# Patient Record
Sex: Female | Born: 1950
Health system: Southern US, Community
[De-identification: ages and names within clinical notes are randomized; demographics above are authoritative.]

## PROBLEM LIST (undated history)

## (undated) DIAGNOSIS — R9431 Abnormal electrocardiogram [ECG] [EKG]: Secondary | ICD-10-CM

## (undated) DIAGNOSIS — F32A Depression, unspecified: Secondary | ICD-10-CM

## (undated) DIAGNOSIS — I4891 Unspecified atrial fibrillation: Secondary | ICD-10-CM

## (undated) DIAGNOSIS — Z950 Presence of cardiac pacemaker: Secondary | ICD-10-CM

## (undated) DIAGNOSIS — I5032 Chronic diastolic (congestive) heart failure: Secondary | ICD-10-CM

## (undated) DIAGNOSIS — M545 Low back pain: Secondary | ICD-10-CM

## (undated) DIAGNOSIS — I609 Nontraumatic subarachnoid hemorrhage, unspecified: Secondary | ICD-10-CM

## (undated) DIAGNOSIS — C4490 Unspecified malignant neoplasm of skin, unspecified: Secondary | ICD-10-CM

## (undated) DIAGNOSIS — E782 Mixed hyperlipidemia: Secondary | ICD-10-CM

## (undated) DIAGNOSIS — I7121 Aneurysm of the ascending aorta, without rupture: Secondary | ICD-10-CM

## (undated) DIAGNOSIS — N189 Chronic kidney disease, unspecified: Secondary | ICD-10-CM

## (undated) DIAGNOSIS — F319 Bipolar disorder, unspecified: Secondary | ICD-10-CM

## (undated) DIAGNOSIS — M109 Gout, unspecified: Secondary | ICD-10-CM

## (undated) DIAGNOSIS — I10 Essential (primary) hypertension: Secondary | ICD-10-CM

## (undated) DIAGNOSIS — S065XAA Traumatic subdural hemorrhage with loss of consciousness status unknown, initial encounter: Secondary | ICD-10-CM

## (undated) DIAGNOSIS — E669 Obesity, unspecified: Secondary | ICD-10-CM

## (undated) DIAGNOSIS — R296 Repeated falls: Secondary | ICD-10-CM

## (undated) DIAGNOSIS — I4892 Unspecified atrial flutter: Secondary | ICD-10-CM

## (undated) DIAGNOSIS — E785 Hyperlipidemia, unspecified: Secondary | ICD-10-CM

## (undated) DIAGNOSIS — K5792 Diverticulitis of intestine, part unspecified, without perforation or abscess without bleeding: Secondary | ICD-10-CM

## (undated) DIAGNOSIS — Z954 Presence of other heart-valve replacement: Secondary | ICD-10-CM

## (undated) DIAGNOSIS — E119 Type 2 diabetes mellitus without complications: Secondary | ICD-10-CM

## (undated) DIAGNOSIS — R001 Bradycardia, unspecified: Secondary | ICD-10-CM

## (undated) DIAGNOSIS — D649 Anemia, unspecified: Secondary | ICD-10-CM

## (undated) DIAGNOSIS — D259 Leiomyoma of uterus, unspecified: Secondary | ICD-10-CM

## (undated) DIAGNOSIS — E0500 Thyrotoxicosis with diffuse goiter without thyrotoxic crisis or storm: Secondary | ICD-10-CM

## (undated) DIAGNOSIS — W19XXXA Unspecified fall, initial encounter: Secondary | ICD-10-CM

## (undated) DIAGNOSIS — G43909 Migraine, unspecified, not intractable, without status migrainosus: Secondary | ICD-10-CM

## (undated) DIAGNOSIS — E1169 Type 2 diabetes mellitus with other specified complication: Secondary | ICD-10-CM

## (undated) DIAGNOSIS — F329 Major depressive disorder, single episode, unspecified: Secondary | ICD-10-CM

## (undated) DIAGNOSIS — N390 Urinary tract infection, site not specified: Secondary | ICD-10-CM

## (undated) HISTORY — DX: Presence of other heart-valve replacement: Z95.4

## (undated) HISTORY — DX: Obesity, unspecified: E66.9

## (undated) HISTORY — DX: Depression, unspecified: F32.A

## (undated) HISTORY — PX: CHOLECYSTECTOMY: SHX55

## (undated) HISTORY — DX: Urinary tract infection, site not specified: N39.0

## (undated) HISTORY — DX: Mixed hyperlipidemia: E78.2

## (undated) HISTORY — DX: Unspecified malignant neoplasm of skin, unspecified: C44.90

## (undated) HISTORY — DX: Major depressive disorder, single episode, unspecified: F32.9

## (undated) HISTORY — DX: Thyrotoxicosis with diffuse goiter without thyrotoxic crisis or storm: E05.00

## (undated) HISTORY — PX: OTHER SURGICAL HISTORY: SHX169

## (undated) HISTORY — DX: Leiomyoma of uterus, unspecified: D25.9

## (undated) HISTORY — DX: Anemia, unspecified: D64.9

## (undated) HISTORY — DX: Migraine, unspecified, not intractable, without status migrainosus: G43.909

## (undated) HISTORY — DX: Diverticulitis of intestine, part unspecified, without perforation or abscess without bleeding: K57.92

## (undated) HISTORY — PX: TUBAL LIGATION: SHX77

## (undated) HISTORY — DX: Low back pain: M54.5

## (undated) HISTORY — DX: Hyperlipidemia, unspecified: E78.5

## (undated) HISTORY — PX: APPENDECTOMY: SHX54

## (undated) HISTORY — DX: Bipolar disorder, unspecified: F31.9

## (undated) HISTORY — DX: Type 2 diabetes mellitus with other specified complication: E11.69

## (undated) HISTORY — PX: ABDOMINAL HYSTERECTOMY: SHX81

## (undated) HISTORY — DX: Gout, unspecified: M10.9

---

## 2004-02-06 ENCOUNTER — Encounter: Payer: Self-pay | Admitting: Gastroenterology

## 2004-05-26 LAB — HM MAMMOGRAPHY

## 2006-05-26 LAB — HM COLONOSCOPY

## 2007-04-15 ENCOUNTER — Encounter: Payer: Self-pay | Admitting: Gastroenterology

## 2008-01-17 ENCOUNTER — Ambulatory Visit: Payer: Self-pay | Admitting: Vascular Surgery

## 2008-02-08 ENCOUNTER — Ambulatory Visit: Payer: Self-pay | Admitting: Vascular Surgery

## 2008-02-21 ENCOUNTER — Ambulatory Visit: Payer: Self-pay | Admitting: Vascular Surgery

## 2008-03-06 ENCOUNTER — Ambulatory Visit: Payer: Self-pay | Admitting: Vascular Surgery

## 2008-03-20 ENCOUNTER — Ambulatory Visit: Payer: Self-pay | Admitting: Vascular Surgery

## 2008-04-13 ENCOUNTER — Encounter: Admission: RE | Admit: 2008-04-13 | Discharge: 2008-04-13 | Payer: Self-pay | Admitting: Interventional Cardiology

## 2008-04-24 ENCOUNTER — Ambulatory Visit: Payer: Self-pay | Admitting: Vascular Surgery

## 2008-04-24 ENCOUNTER — Encounter (INDEPENDENT_AMBULATORY_CARE_PROVIDER_SITE_OTHER): Payer: Self-pay | Admitting: Interventional Cardiology

## 2008-04-24 ENCOUNTER — Inpatient Hospital Stay (HOSPITAL_BASED_OUTPATIENT_CLINIC_OR_DEPARTMENT_OTHER): Admission: RE | Admit: 2008-04-24 | Discharge: 2008-04-24 | Payer: Self-pay | Admitting: Interventional Cardiology

## 2008-04-27 ENCOUNTER — Ambulatory Visit (HOSPITAL_COMMUNITY): Admission: RE | Admit: 2008-04-27 | Discharge: 2008-04-27 | Payer: Self-pay | Admitting: Interventional Cardiology

## 2008-05-02 ENCOUNTER — Ambulatory Visit: Payer: Self-pay | Admitting: Surgery

## 2008-05-04 ENCOUNTER — Ambulatory Visit (HOSPITAL_COMMUNITY): Admission: RE | Admit: 2008-05-04 | Discharge: 2008-05-04 | Payer: Self-pay | Admitting: Surgery

## 2008-05-09 ENCOUNTER — Ambulatory Visit: Payer: Self-pay | Admitting: Surgery

## 2008-05-22 ENCOUNTER — Inpatient Hospital Stay (HOSPITAL_COMMUNITY): Admission: RE | Admit: 2008-05-22 | Discharge: 2008-05-28 | Payer: Self-pay | Admitting: Surgery

## 2008-05-22 ENCOUNTER — Ambulatory Visit: Payer: Self-pay | Admitting: Surgery

## 2008-05-22 ENCOUNTER — Encounter: Payer: Self-pay | Admitting: Surgery

## 2008-06-15 ENCOUNTER — Encounter (HOSPITAL_COMMUNITY): Admission: RE | Admit: 2008-06-15 | Discharge: 2008-09-13 | Payer: Self-pay | Admitting: Interventional Cardiology

## 2008-06-26 ENCOUNTER — Encounter: Admission: RE | Admit: 2008-06-26 | Discharge: 2008-06-26 | Payer: Self-pay | Admitting: Surgery

## 2008-06-26 ENCOUNTER — Ambulatory Visit: Payer: Self-pay | Admitting: Surgery

## 2008-07-19 ENCOUNTER — Encounter: Admission: RE | Admit: 2008-07-19 | Discharge: 2008-07-19 | Payer: Self-pay | Admitting: Interventional Cardiology

## 2008-07-21 ENCOUNTER — Ambulatory Visit (HOSPITAL_COMMUNITY): Admission: RE | Admit: 2008-07-21 | Discharge: 2008-07-21 | Payer: Self-pay | Admitting: Interventional Cardiology

## 2008-09-14 ENCOUNTER — Encounter (HOSPITAL_COMMUNITY): Admission: RE | Admit: 2008-09-14 | Discharge: 2008-12-13 | Payer: Self-pay | Admitting: Interventional Cardiology

## 2009-05-26 HISTORY — PX: AORTIC VALVE REPLACEMENT: SHX41

## 2010-01-24 ENCOUNTER — Inpatient Hospital Stay (HOSPITAL_COMMUNITY): Admission: EM | Admit: 2010-01-24 | Discharge: 2010-01-31 | Payer: Self-pay | Admitting: General Surgery

## 2010-01-24 ENCOUNTER — Ambulatory Visit: Payer: Self-pay | Admitting: Diagnostic Radiology

## 2010-01-24 ENCOUNTER — Encounter: Payer: Self-pay | Admitting: Emergency Medicine

## 2010-01-25 ENCOUNTER — Encounter (INDEPENDENT_AMBULATORY_CARE_PROVIDER_SITE_OTHER): Payer: Self-pay

## 2010-02-05 ENCOUNTER — Inpatient Hospital Stay (HOSPITAL_COMMUNITY): Admission: EM | Admit: 2010-02-05 | Discharge: 2010-02-13 | Payer: Self-pay | Admitting: Emergency Medicine

## 2010-02-09 ENCOUNTER — Encounter (INDEPENDENT_AMBULATORY_CARE_PROVIDER_SITE_OTHER): Payer: Self-pay | Admitting: *Deleted

## 2010-03-11 ENCOUNTER — Ambulatory Visit: Payer: Self-pay | Admitting: Family Medicine

## 2010-03-11 DIAGNOSIS — D259 Leiomyoma of uterus, unspecified: Secondary | ICD-10-CM | POA: Insufficient documentation

## 2010-03-11 DIAGNOSIS — E782 Mixed hyperlipidemia: Secondary | ICD-10-CM | POA: Insufficient documentation

## 2010-03-11 DIAGNOSIS — E663 Overweight: Secondary | ICD-10-CM | POA: Insufficient documentation

## 2010-03-11 DIAGNOSIS — Z8679 Personal history of other diseases of the circulatory system: Secondary | ICD-10-CM | POA: Insufficient documentation

## 2010-03-11 DIAGNOSIS — K5900 Constipation, unspecified: Secondary | ICD-10-CM

## 2010-03-11 DIAGNOSIS — K5730 Diverticulosis of large intestine without perforation or abscess without bleeding: Secondary | ICD-10-CM | POA: Insufficient documentation

## 2010-03-11 DIAGNOSIS — Z9189 Other specified personal risk factors, not elsewhere classified: Secondary | ICD-10-CM | POA: Insufficient documentation

## 2010-03-11 DIAGNOSIS — Z952 Presence of prosthetic heart valve: Secondary | ICD-10-CM | POA: Insufficient documentation

## 2010-03-11 DIAGNOSIS — Z8619 Personal history of other infectious and parasitic diseases: Secondary | ICD-10-CM | POA: Insufficient documentation

## 2010-03-11 DIAGNOSIS — F319 Bipolar disorder, unspecified: Secondary | ICD-10-CM

## 2010-03-11 DIAGNOSIS — E781 Pure hyperglyceridemia: Secondary | ICD-10-CM | POA: Insufficient documentation

## 2010-03-11 DIAGNOSIS — Z954 Presence of other heart-valve replacement: Secondary | ICD-10-CM

## 2010-03-11 HISTORY — DX: Bipolar disorder, unspecified: F31.9

## 2010-03-11 HISTORY — DX: Presence of other heart-valve replacement: Z95.4

## 2010-03-11 HISTORY — DX: Constipation, unspecified: K59.00

## 2010-03-11 HISTORY — DX: Leiomyoma of uterus, unspecified: D25.9

## 2010-03-11 HISTORY — DX: Mixed hyperlipidemia: E78.2

## 2010-03-13 ENCOUNTER — Encounter: Payer: Self-pay | Admitting: Family Medicine

## 2010-03-13 LAB — CONVERTED CEMR LAB
Albumin: 4.6 g/dL (ref 3.5–5.2)
BUN: 27 mg/dL — ABNORMAL HIGH (ref 6–23)
CO2: 27 meq/L (ref 19–32)
Calcium: 9.5 mg/dL (ref 8.4–10.5)
Chloride: 105 meq/L (ref 96–112)
Creatinine, Ser: 1.09 mg/dL (ref 0.40–1.20)
Glucose, Bld: 86 mg/dL (ref 70–99)
HCT: 31 % — ABNORMAL LOW (ref 36.0–46.0)
Hemoglobin: 9.7 g/dL — ABNORMAL LOW (ref 12.0–15.0)
MCHC: 31.3 g/dL (ref 30.0–36.0)
MCV: 92.5 fL (ref 78.0–100.0)
Phosphorus: 3.7 mg/dL (ref 2.3–4.6)
Platelets: 250 10*3/uL (ref 150–400)
Potassium: 4.1 meq/L (ref 3.5–5.3)
RBC: 3.35 M/uL — ABNORMAL LOW (ref 3.87–5.11)
RDW: 15 % (ref 11.5–15.5)
Sodium: 143 meq/L (ref 135–145)
TSH: 2.171 microintl units/mL (ref 0.350–4.500)
WBC: 5 10*3/uL (ref 4.0–10.5)

## 2010-03-19 ENCOUNTER — Telehealth: Payer: Self-pay | Admitting: Family Medicine

## 2010-03-19 ENCOUNTER — Encounter: Payer: Self-pay | Admitting: Gastroenterology

## 2010-03-19 ENCOUNTER — Ambulatory Visit: Payer: Self-pay | Admitting: Family Medicine

## 2010-03-19 DIAGNOSIS — R1084 Generalized abdominal pain: Secondary | ICD-10-CM | POA: Insufficient documentation

## 2010-03-25 ENCOUNTER — Telehealth (INDEPENDENT_AMBULATORY_CARE_PROVIDER_SITE_OTHER): Payer: Self-pay | Admitting: *Deleted

## 2010-04-30 ENCOUNTER — Ambulatory Visit: Payer: Self-pay | Admitting: Gastroenterology

## 2010-05-07 ENCOUNTER — Ambulatory Visit: Payer: Self-pay | Admitting: Family Medicine

## 2010-05-28 DIAGNOSIS — D649 Anemia, unspecified: Secondary | ICD-10-CM | POA: Insufficient documentation

## 2010-05-28 DIAGNOSIS — M25569 Pain in unspecified knee: Secondary | ICD-10-CM | POA: Insufficient documentation

## 2010-05-28 HISTORY — DX: Anemia, unspecified: D64.9

## 2010-05-29 ENCOUNTER — Encounter (INDEPENDENT_AMBULATORY_CARE_PROVIDER_SITE_OTHER): Payer: Self-pay | Admitting: *Deleted

## 2010-05-29 ENCOUNTER — Encounter
Admission: RE | Admit: 2010-05-29 | Discharge: 2010-05-29 | Payer: Self-pay | Source: Home / Self Care | Attending: Family Medicine | Admitting: Family Medicine

## 2010-05-29 LAB — CONVERTED CEMR LAB
HCT: 33.1 %
Hemoglobin: 10.5 g/dL
MCV: 94.3 fL
Platelets: 279 10*3/uL
RBC: 3.51 M/uL
RDW: 13.6 %
WBC: 7.8 10*3/uL

## 2010-05-30 ENCOUNTER — Telehealth (INDEPENDENT_AMBULATORY_CARE_PROVIDER_SITE_OTHER): Payer: Self-pay | Admitting: *Deleted

## 2010-05-30 ENCOUNTER — Encounter (INDEPENDENT_AMBULATORY_CARE_PROVIDER_SITE_OTHER): Payer: Self-pay | Admitting: *Deleted

## 2010-06-11 ENCOUNTER — Ambulatory Visit
Admission: RE | Admit: 2010-06-11 | Discharge: 2010-06-11 | Payer: Self-pay | Source: Home / Self Care | Attending: Gastroenterology | Admitting: Gastroenterology

## 2010-06-25 NOTE — Progress Notes (Signed)
  Phone Note Outgoing Call   Call placed by: Rita Ohara Call placed to: Patient Summary of Call: Patient will be picking up hemacult cards this week because she droped off the wrong specimen.

## 2010-06-25 NOTE — Assessment & Plan Note (Signed)
Summary: BRAND NEW PT/TO EST/CJR   Vital Signs:  Patient profile:   60 year old female Menstrual status:  hysterectomy Height:      67.25 inches (170.82 cm) Weight:      179 pounds (81.36 kg) BMI:     27.93 O2 Sat:      99 % on Room air Temp:     99.0 degrees F (37.22 degrees C) oral Pulse rate:   82 / minute BP sitting:   132 / 78  (left arm) Cuff size:   regular  Vitals Entered By: Josph Macho RMA (March 11, 2010 11:32 AM)  O2 Flow:  Room air  Serial Vital Signs/Assessments:  Time      Position  BP       Pulse  Resp  Temp     By                     128/78                         Danise Edge MD  CC: Establish new pt/ CF Is Patient Diabetic? No     Menstrual Status hysterectomy   History of Present Illness: is a 60 year old nonsmoking Caucasian female in today with her mother for an initial new patient appointment. She has a complicated past medical history which includes long stay in [facility] this year for an Escherichia coli infection in her peritoneum. She went in for an appendicitis within several days at home and then had to go back secondary to her infection free and without having to reopen her and treat her with antibiotics and he'll quite content she says her abdominal pain is improved at this time but she is having a lot of trouble with constipation she reports taking 66 stool softeners over-the-counter b.i.d. She is unsure of the actual name of it. She also has a complicated cardiac history describes a murmur since birth with ultimately was followed his aortic stenosis resulting in an aortic valve replacement 2 years ago. She and her mother they report they have a cardiac aneurysm as well. Patient reports which was corrected when they replaced her aortic valve. Her cardiothoracic surgeon was Dr. Katrinka Blazing we will obtain those records. She has a long history of bipolar depression as well and has seen Dr. Andee Poles in the past continues to see her and her  partner for ongoing maintenance she does report depression slightly worsened but is not suicidal. She and her mother complex family history of depression alcoholism and anxiety her father was abusive, mentally ill and a drinker. She has 2 brothers both of whom are from depression and alcohol abuse and one of whom committed suicide at age 12. Today the patient's major complaint is constipation. She denies chest pain, palpitations, fevers, chills, recent illness,GU complaints. She denies any bloody or tarry stool, fevers, chills, nausea, vomiting or diarrhea  Preventive Screening-Counseling & Management  Alcohol-Tobacco     Smoking Status: quit  Caffeine-Diet-Exercise     Does Patient Exercise: no      Drug Use:  no.    Current Problems (verified): 1)  Diverticulosis of Colon  (ICD-562.10) 2)  Fibroids, Uterus  (ICD-218.9) 3)  Bipolar Affective Disorder  (ICD-296.80) 4)  Hypertriglyceridemia  (ICD-272.1) 5)  Mixed Hyperlipidemia  (ICD-272.2) 6)  Chickenpox, Hx of  (ICD-V15.9) 7)  Measles, Hx of  (ICD-V12.09) 8)  Aortic Valve Replacement, Hx of  (ICD-V43.3) 9)  Heart  Murmur, Hx of  (ICD-V12.50)  Current Medications (verified): 1)  Warfarin Sodium 5 Mg Tabs (Warfarin Sodium) .... 1/2 To 1 Tablet By Mouth Daily 2)  Acetaminophen 325 Mg Tabs (Acetaminophen) .... 2 Tablets Po As Needed For Pain 3)  Amiodarone Hcl 200 Mg Tabs (Amiodarone Hcl) .... 1/2 Tablet By Mouth Daily 4)  Aspir-Low 81 Mg Tbec (Aspirin) .... Once Daily 5)  Clonazepam 0.5 Mg Tabs (Clonazepam) .... 1/2 Tab By Mouth Two Times A Day 6)  Fenofibrate 160 Mg Tabs (Fenofibrate) .Marland Kitchen.. 1 Tablet By Mouth Daily 7)  Lamictal 150 Mg Tabs (Lamotrigine) .Marland Kitchen.. 1 Tablet By Mouth Daily 8)  Lisinopril-Hydrochlorothiazide 10-12.5 Mg Tabs (Lisinopril-Hydrochlorothiazide) .Marland Kitchen.. 1 Tablet By Mouth Daily 9)  Pravachol 40 Mg Tabs (Pravastatin Sodium) .Marland Kitchen.. 1 Tablet By Mouth At Bedtime 10)  Seroquel 200 Mg Tabs (Quetiapine Fumarate) .Marland Kitchen.. 1-2 Tablets By  Mouth At Bedtime 11)  Singulair 10 Mg Tabs (Montelukast Sodium) .Marland Kitchen.. 1 Tablet By Mouth Daily  Allergies (verified): No Known Drug Allergies  Past History:  Past Surgical History: Appendectomy, required revision for EColi infection, required recurrent packing Bowel Obstruction requiring adhesions to be removed Hysterectomy & sb/l spo, total Aortic Valve Replacement:  Open heart with a cardiac aneurysm repair? Cholecystectomy childhood exploratory surgeryof heart. Tubal ligation Varicose vein surgery b/l legs  Family History: Father: deceased@52 , unknow cause, very ill, abusive and alcoholic Mother: 36, heart aneurysm, rheumatoid arthritis, scoliosis Siblings:  Brother: deceased@51 , depression, suicide, alcoholic Sister: 10, hysterectomy, depression Sister: 80, depression Brother: deceased of heart disease @45 , alcoholic, gout, smoker, depression MGM: deceased@86 , colon cancer, cardiac arrest in surgery MGF: deceased@68 , alcoholic, heart disease, DMII, s/p amp of leg. PGM: deceased in 70s, DM, depression PGF: deceased in late 84s , depression, DM Children:  Daughter: 60, A&W Son: 14, A&W  Social History: Occupation: Engineering geologist at [employer] Married Former Smoker: quit a light habit 20 years ago Alcohol use-yes, very special occasions Drug use-no Regular exercise-no Wears a seat beltOccupation:  employed Smoking Status:  quit Drug Use:  no Does Patient Exercise:  no  Review of Systems       Flu Vaccine Consent Questions     Do you have a history of severe allergic reactions to this vaccine? no    Any prior history of allergic reactions to egg and/or gelatin? no    Do you have a sensitivity to the preservative Thimersol? no    Do you have a past history of Guillan-Barre Syndrome? no    Do you currently have an acute febrile illness? no    Have you ever had a severe reaction to latex? no    Vaccine information given and explained to patient? yes    Are you  currently pregnant? no    Lot Number:[id number]   Exp Date:11/23/2010   Site Given  Right Deltoid IM Josph Macho RMA  March 11, 2010 11:47 AM   Physical Exam  General:  Well-developed,well-nourished,in no acute distress; alert,appropriate and cooperative throughout examination Head:  Normocephalic and atraumatic without obvious abnormalities. No apparent alopecia or balding. Eyes:  No corneal or conjunctival inflammation noted. EOMI. Perrla.  Ears:  External ear exam shows no significant lesions or deformities.  Otoscopic examination reveals clear canals, tympanic membranes are intact bilaterally without bulging, retraction, inflammation or discharge. Hearing is grossly normal bilaterally. Nose:  External nasal examination shows no deformity or inflammation. Nasal mucosa are pink and moist without lesions or exudates. Mouth:  Oral mucosa and oropharynx without lesions or exudates.  Teeth in good repair. Neck:  No deformities, masses, or tenderness noted. Chest Wall:  sternotomy scar Lungs:  Normal respiratory effort, chest expands symmetrically. Lungs are clear to auscultation, no crackles or wheezes. Heart:  normal rate, regular rhythm, and systolic click.   Abdomen:  Bowel sounds positive,abdomen soft and non-tender without masses, organomegaly or hernias noted. Msk:  No deformity or scoliosis noted of thoracic or lumbar spine.   Pulses:  R and L carotid,radial,femoral,dorsalis pedis and posterior tibial pulses are full and equal bilaterally Extremities:  No clubbing, cyanosis, edema, or deformity noted with normal full range of motion of all joints.   Neurologic:  No cranial nerve deficits noted. Station and gait are normal. Plantar reflexes are down-going bilaterally. DTRs are symmetrical throughout. Sensory, motor and coordinative functions appear intact. Cervical Nodes:  No lymphadenopathy noted Psych:  Cognition and judgment appear intact. Alert and cooperative with normal  attention span and concentration. No apparent delusions, illusions, hallucinationsslightly anxious.     Impression & Recommendations:  Problem # 1:  MIXED HYPERLIPIDEMIA (ICD-272.2)  Her updated medication list for this problem includes:    Fenofibrate 160 Mg Tabs (Fenofibrate) .Marland Kitchen... 1 tablet by mouth daily    Pravachol 40 Mg Tabs (Pravastatin sodium) .Marland Kitchen... 1 tablet by mouth at bedtime Copy of labs brought in by patient shows total cholestesrol of 175, ldl of 93 back on 02/22/2010. No change to medications at this time, avoid trans fats  Problem # 2:  BIPOLAR AFFECTIVE DISORDER (ICD-296.80) Will cont to follow with Dr Nolen Mu, no chang to meds  Problem # 3:  AORTIC VALVE REPLACEMENT, HX OF (ICD-V43.3) Is following with Dr Katrinka Blazing who monitors her coumadin, will request their records.  Problem # 4:  OVERWEIGHT (ICD-278.02) Avoid trans fats, increase exercise eat small frequent meals  Problem # 5:  CONSTIPATION (ICD-564.00)  Her updated medication list for this problem includes:    Lactulose 10 Gm/54ml Soln (Lactulose) .Marland Kitchen... 1 tbls by mouth three times a day Hi fiber diet, add a fiber supplement, 64 oz of clear fluids, increase exercise, add a probiotic and use pericolace 2 once daily, If no BM in 3 days use MOM, prune juice and or Dulcolax supp.  Complete Medication List: 1)  Warfarin Sodium 5 Mg Tabs (Warfarin sodium) .... 1/2 to 1 tablet by mouth daily 2)  Acetaminophen 325 Mg Tabs (Acetaminophen) .... 2 tablets po as needed for pain 3)  Amiodarone Hcl 200 Mg Tabs (Amiodarone hcl) .... 1/2 tablet by mouth daily 4)  Aspir-low 81 Mg Tbec (Aspirin) .... Once daily 5)  Clonazepam 0.5 Mg Tabs (Clonazepam) .... 1/2 tab by mouth two times a day 6)  Fenofibrate 160 Mg Tabs (Fenofibrate) .Marland Kitchen.. 1 tablet by mouth daily 7)  Lamictal 150 Mg Tabs (Lamotrigine) .Marland Kitchen.. 1 tablet by mouth daily 8)  Lisinopril-hydrochlorothiazide 10-12.5 Mg Tabs (Lisinopril-hydrochlorothiazide) .Marland Kitchen.. 1 tablet by mouth  daily 9)  Pravachol 40 Mg Tabs (Pravastatin sodium) .Marland Kitchen.. 1 tablet by mouth at bedtime 10)  Seroquel 200 Mg Tabs (Quetiapine fumarate) .Marland Kitchen.. 1-2 tablets by mouth at bedtime 11)  Singulair 10 Mg Tabs (Montelukast sodium) .Marland Kitchen.. 1 tablet by mouth daily 12)  Lactulose 10 Gm/59ml Soln (Lactulose) .Marland Kitchen.. 1 tbls by mouth three times a day  Other Orders: Admin 1st Vaccine (16109) Flu Vaccine 70yrs + (60454) Tdap => 75yrs IM (09811) Admin of Any Addtl Vaccine (91478)  Patient Instructions: 1)  Please schedule a follow-up appointment in 1 to 2 month.  2)  For bowels,  3)  64 oz clear fluids, whole grains, lean proteins, increase exercise 4)  Add Align probiotic cap daily 5)  Consider a fiber supplement two times a day, such as Benefiber or Citracel 6)  Take Pericolace or Senna S, a stool softener & laxative combo, 2 daily 7)  If no BM in 3 days milk of magnesia 2 tbls in 4 oz warm prune juice by mouth and then if no results, repeat in 4-6 hours with a Dulcolax  supp pr at same time 8)  Lactulose is three times a day  9)  Release of Records Dr Katrinka Blazing, cardiothoracic  Prescriptions: LACTULOSE 10 GM/15ML SOLN (LACTULOSE) 1 tbls by mouth three times a day  #1500cc x 3   Entered and Authorized by:   Danise Edge MD   Signed by:   Danise Edge MD on 03/11/2010   Method used:   Electronically to        [facility] [phone]* (retail)       [address]       [place], [place]  [address]       Ph: [phone] or [phone]       Fax: [phone]   RxID:   [id number] LACTULOSE 10 GM/15ML SOLN (LACTULOSE) 1 tbls by mouth three times  #1500cc x 3   Entered and Authorized by:   Danise Edge MD   Signed by:   Danise Edge MD on 03/11/2010   Method used:   Electronically to        [facility] [phone]* (retail)       [address]       [place], [place]  [address]       Ph: [phone] or [phone]       Fax: [phone]   RxID:   [id number] SINGULAIR 10 MG TABS (MONTELUKAST SODIUM) 1 tablet by  mouth daily  #30 x 1   Entered by:   Josph Macho RMA   Authorized by:   Danise Edge MD   Signed by:   Josph Macho RMA on 03/11/2010   Method used:   Electronically to        [facility] [phone]* (retail)       [address]       [place], [place]  [address]       Ph: [phone] or [phone]       Fax: [phone]   RxID:   [id number]    Orders Added: 1)  Admin 1st Vaccine [90471] 2)  Flu Vaccine 20yrs + [32992] 3)  Tdap => 64yrs IM [42683] 4)  Admin of Any Addtl Vaccine [90472] 5)  New Patient Level IV [41962]   Immunizations Administered:  Tetanus Vaccine:    Vaccine Type: Tdap    Site: left deltoid    Mfr: GlaxoSmithKline    Dose: 0.5 ml    Route: IM    Given by: Josph Macho RMA    Exp. Date: 03/14/2012    Lot #: [id number]    VIS given: 04/12/08 version given March 11, 2010.   Immunizations Administered:  Tetanus Vaccine:    Vaccine Type: Tdap    Site: left deltoid    Mfr: GlaxoSmithKline    Dose: 0.5 ml    Route: IM    Given by: Josph Macho RMA    Exp. Date: 03/14/2012    Lot #: [id number]    VIS given: 04/12/08 version given March 11, 2010.  Preventive Care Screening  Colonoscopy:    Date:  05/26/2006    Results:  historical   Mammogram:    Date:  05/26/2004    Results:  historical   Bone Density:    Date:  05/26/1993    Results:  historical std dev

## 2010-06-25 NOTE — Progress Notes (Signed)
  Phone Note Call from Patient   Summary of Call: Pt called in and stated she had two bottles of the Lactulose! Pt was taking it twice because she had two bottles, didn't realize it was the same med. Pt states this is what is probably wrong with her stomach! Initial call taken by: Josph Macho RMA,  March 19, 2010 3:35 PM

## 2010-06-25 NOTE — Assessment & Plan Note (Signed)
Summary: NOT TOLERATING MEDICATION//SLM   Vital Signs:  Patient profile:   60 year old female Menstrual status:  hysterectomy Height:      67.25 inches (170.82 cm) Weight:      198.31 pounds (90.14 kg) O2 Sat:      98 % on Room air Temp:     97.3 degrees F (36.28 degrees C) oral Pulse rate:   80 / minute BP sitting:   110 / 68  (left arm) Cuff size:   regular  Vitals Entered By: Josph Macho RMA (March 19, 2010 10:38 AM)  O2 Flow:  Room air CC: Not tolerating meds/ pt stopped taking Lactulose and Hemocyte/ CF Is Patient Diabetic? No   History of Present Illness: Patient in today for reevaluation of her intermittent constipation and diarrhea. She has a long history of abdominal adhesions previously taken down multiple times. In September of this year she had an emergency appendectomy has been struggling with constipation since then and we started her on Lactulose, she took it until 3 days ago but the cramps were getting so bad she was nauseous and diaphoretic and doubling her over. She was moving her bowels but only in small amounts when she took her Lactulose two times a day. She tried Benefiber as well but got bloated and gassy. She last had a colonoscopy in November in 2008. Reported diverticulosis at that time. Denies any history of polyps. No bloody or tarry stool. No f/c/n/v/CP/palp/SOB/GU c/o.   Current Medications (verified): 1)  Warfarin Sodium 5 Mg Tabs (Warfarin Sodium) .... 1/2 To 1 Tablet By Mouth Daily 2)  Acetaminophen 325 Mg Tabs (Acetaminophen) .... 2 Tablets Po As Needed For Pain 3)  Amiodarone Hcl 200 Mg Tabs (Amiodarone Hcl) .... 1/2 Tablet By Mouth Daily 4)  Aspir-Low 81 Mg Tbec (Aspirin) .... Once Daily 5)  Clonazepam 0.5 Mg Tabs (Clonazepam) .... 1/2 Tab By Mouth Two Times A Day 6)  Fenofibrate 160 Mg Tabs (Fenofibrate) .Marland Kitchen.. 1 Tablet By Mouth Daily 7)  Lamictal 150 Mg Tabs (Lamotrigine) .Marland Kitchen.. 1 Tablet By Mouth Daily 8)  Lisinopril-Hydrochlorothiazide 10-12.5  Mg Tabs (Lisinopril-Hydrochlorothiazide) .Marland Kitchen.. 1 Tablet By Mouth Daily 9)  Pravachol 40 Mg Tabs (Pravastatin Sodium) .Marland Kitchen.. 1 Tablet By Mouth At Bedtime 10)  Seroquel 200 Mg Tabs (Quetiapine Fumarate) .Marland Kitchen.. 1-2 Tablets By Mouth At Bedtime 11)  Singulair 10 Mg Tabs (Montelukast Sodium) .Marland Kitchen.. 1 Tablet By Mouth Daily 12)  Lactulose 10 Gm/50ml Soln (Lactulose) .Marland Kitchen.. 1 Tbls By Mouth Three Times A Day 13)  Hemocyte-F 324-1 Mg Tabs (Ferrous Fumarate-Folic Acid) .... Once Daily  Allergies (verified): No Known Drug Allergies  Past History:  Past medical history reviewed for relevance to current acute and chronic problems. Social history (including risk factors) reviewed for relevance to current acute and chronic problems.  Social History: Reviewed history from 03/11/2010 and no changes required. 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  Teeth in good repair. Lungs:  Normal respiratory effort, chest expands symmetrically. Lungs are clear to auscultation, no crackles or wheezes. Heart:  systolic click and grade 1 /6 systolic murmur.   Abdomen:  soft, normal bowel sounds, no guarding, and no rigidity.  Mildly tender in suprapubic region, abdominal wall defect just below umbilicus, no obvious hernia. Extremities:  No clubbing, cyanosis, edema, or deformity noted  Psych:  Cognition and judgment appear intact. Alert and cooperative with normal attention span and concentration. No apparent delusions, illusions, hallucinations. flat affect.     Impression & Recommendations:  Problem # 1:  ABDOMINAL PAIN, GENERALIZED  (ICD-789.07)  Orders: Gastroenterology Referral (GI) Patient did not tolerate the Lactulose and fiber, she developed severe abdominal pain with the cramping being diffuse but worse in the lower quadrants.  Patient encouraged to keep a probiotic, such as Align daily, may need a referral to general surgery for lysis of adhesions if pain worsens, Given Hyoscyamine to use prn  Problem # 2:  AORTIC VALVE REPLACEMENT, HX OF (ICD-V43.3) Doing well, asymptomatic  Complete Medication List: 1)  Warfarin Sodium 5 Mg Tabs (Warfarin sodium) .... 1/2 to 1 tablet by mouth daily 2)  Acetaminophen 325 Mg Tabs (Acetaminophen) .... 2 tablets po as needed for pain 3)  Amiodarone Hcl 200 Mg Tabs (Amiodarone hcl) .... 1/2 tablet by mouth daily 4)  Aspir-low 81 Mg Tbec (Aspirin) .... Once daily 5)  Clonazepam 0.5 Mg Tabs (Clonazepam) .... 1/2 tab by mouth two times a day 6)  Fenofibrate 160 Mg Tabs (Fenofibrate) .Marland Kitchen.. 1 tablet by mouth daily 7)  Lamictal 150 Mg Tabs (Lamotrigine) .Marland Kitchen.. 1 tablet by mouth daily 8)  Lisinopril-hydrochlorothiazide 10-12.5 Mg Tabs (Lisinopril-hydrochlorothiazide) .Marland Kitchen.. 1 tablet by mouth daily 9)  Pravachol 40 Mg Tabs (Pravastatin sodium) .Marland Kitchen.. 1 tablet by mouth at bedtime 10)  Seroquel 200 Mg Tabs (Quetiapine fumarate) .Marland Kitchen.. 1-2 tablets by mouth at bedtime 11)  Singulair 10 Mg Tabs (Montelukast sodium) .Marland Kitchen.. 1 tablet by mouth daily 12)  Lactulose 10 Gm/3ml Soln (Lactulose) .Marland Kitchen.. 1 tbls by mouth three times a day 13)  Hemocyte-f 324-1 Mg Tabs (Ferrous fumarate-folic acid) .... Once daily 14)  Hyoscyamine Sulfate 0.125 Mg Tabs (Hyoscyamine sulfate) .Marland Kitchen.. 1 tab sl as needed abdominal pain q 2-4 hours  Patient Instructions: 1)  Please schedule a follow-up appointment in 2 to 3 weeks.  2)  For bowels switch from Senna S 2 tabs daily, push clear fluids eat hi fiber diet.  3)  Stop Lactulose and fiber supplement for now. 4)  On day two to three of no BM, then take Milk of Magnesia 2 tbls in 4  oz of warm prune juice by mouth and a Dulcolax suppository per rectum may repeat in 4-6 hours if no result.  5)  Call us with name of new liquid med. Prescriptions: HYOSCYAMINE SULFATE 0.125 MG TABS (HYOSCYAMINE SULFATE) 1 tab SL as needed abdominal pain q 2-4 hours  #60 x 1   Entered and Authorized by:   Danise Edge MD   Signed by:   Danise Edge MD on 03/19/2010   Method used:   Electronically to        [facility] [phone]* (retail)       [address]       [place], [place]  [address]       Ph: [phone] or [phone]       Fax: [phone]   RxID:   [id number]    Orders Added: 1)  Gastroenterology Referral [GI] 2)  Est. Patient Level III [36644]

## 2010-06-25 NOTE — Assessment & Plan Note (Signed)
History of Present Illness Visit Type: Initial Consult Primary GI MD: Rob Bunting MD Primary Irie Dowson: Danise Edge, MD Requesting Dang Mathison: Danise Edge, MD  Chief Complaint: constipation History of Present Illness:     pleasant 60 year old woman who is here with her husband today. She has had bowel traobules for a long, long time. She has adhesions in abd.  HAd SBO in past about 10 years ago, required ex lap, adhesions lysed.  She had colonoscopy 3 years ago that showed diverticulosis.   she was eating a lot of popcorn, but stopped because she was told she had diverticulosis.    took 12 stool softerns a day up until about  a month (colace).    she has trouble with constipation, would go about 1 time a week without any bowel aids.  She currently takes 5 senekot.  She was trying lactulose but stopped because of abd pains.  she had appendicitis  sept 2011 (Dr. Lindie Spruce).   she bloating with miralax, used it for a long time.  has never tried [place].  she underwent a colonoscopy in 2008 and does not recall any polyps were found. She does remember being told she had diverticulosis.           Current Medications (verified): 1)  Warfarin Sodium 5 Mg Tabs (Warfarin Sodium) .... 1/2 To 1 Tablet By Mouth Daily 2)  Acetaminophen 325 Mg Tabs (Acetaminophen) .... 2 Tablets Po As Needed For Pain 3)  Amiodarone Hcl 200 Mg Tabs (Amiodarone Hcl) .... 1/2 Tablet By Mouth Daily 4)  Aspir-Low 81 Mg Tbec (Aspirin) .... Once Daily 5)  Clonazepam 0.5 Mg Tabs (Clonazepam) .... 1/2 Tab By Mouth Two Times A Day 6)  Fenofibrate 160 Mg Tabs (Fenofibrate) .Marland Kitchen.. 1 Tablet By Mouth Daily 7)  Lamictal 150 Mg Tabs (Lamotrigine) .Marland Kitchen.. 1 Tablet By Mouth Daily 8)  Lisinopril-Hydrochlorothiazide 10-12.5 Mg Tabs (Lisinopril-Hydrochlorothiazide) .Marland Kitchen.. 1 Tablet By Mouth Daily 9)  Pravachol 40 Mg Tabs (Pravastatin Sodium) .Marland Kitchen.. 1 Tablet By Mouth At Bedtime 10)  Seroquel 200 Mg Tabs (Quetiapine Fumarate) .Marland Kitchen.. 1-2 Tablets  By Mouth At Bedtime 11)  Hemocyte-F 324-1 Mg Tabs (Ferrous Fumarate-Folic Acid) .... Once Daily 12)  Hyoscyamine Sulfate 0.125 Mg Tabs (Hyoscyamine Sulfate) .Marland Kitchen.. 1 Tab Sl As Needed Abdominal Pain Q 2-4 Hours 13)  Senokot 8.6 Mg Tabs (Sennosides) .... Five  Tablet By Mouth  At Bedtime 14)  Amitiza 8 Mcg  Caps (Lubiprostone) .Marland Kitchen.. 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 Cholecystectomy childhood exploratory surgeryof heart. Tubal ligation Varicose vein surgery b/l legs    Family History: Father: deceased@52 , unknow cause, very ill, abusive and alcoholic Mother: 70, heart aneurysm, rheumatoid arthritis, scoliosis Siblings:  Brother: deceased@51 , depression, suicide, alcoholic Sister: 38, hysterectomy, depression Sister: 34, depression Brother: deceased of heart disease @45 , alcoholic, gout, smoker, depression MGM: deceased@86 , colon cancer, cardiac arrest in surgery MGF: deceased@68 , alcoholic, heart disease, DMII, s/p amp of leg. PGM: deceased in 73s, DM, depression PGF: deceased in late 47s , depression, DM Children:  Daughter: 45, A&W Son: 56, A&W    Social History: Occupation: Engineering geologist at [employer] Married Former Smoker: quit  a light habit 20 years ago Alcohol use-yes, very special occasions Drug use-no Regular exercise-no Wears a seat belt    Review of Systems       Pertinent positive and negative review of systems were noted in the above HPI and GI specific review of systems.  All other review of systems was otherwise  negative.   Vital Signs:  Patient profile:   60 year old female Menstrual status:  hysterectomy Height:      67.25 inches Weight:      203 pounds BMI:     31.67 BSA:     2.04 Pulse rate:   74 / minute Pulse rhythm:   regular BP sitting:   136 / 82  (left arm) Cuff size:   regular  Vitals Entered By: Ok Anis CMA (April 30, 2010 10:55 AM)  Physical Exam  Additional Exam:  Constitutional: generally well appearing Psychiatric: alert and oriented times 3 Eyes: extraocular movements intact Mouth: oropharynx moist, no lesions Neck: supple, no lymphadenopathy Cardiovascular: heart regular rate and rythm Lungs: CTA bilaterally Abdomen: soft, non-tender, non-distended, no obvious ascites, no peritoneal signs, normal bowel sounds: Abdominal wall hernia in lower abdomen, bulging when she stands or Valsalva's. Extremities: no lower extremity edema bilaterally Skin: no lesions on visible extremities    Impression & Recommendations:  Problem # 1:  chronic constipation, abdominal wall hernia she does have some bulging in her abdominal wall when she stands and a clear abdominal wall defect. This is uncomfortable to her at times and I will refer her back to Dr. Lindie Spruce to consider elective hernia repair if she feels it is necessary. We will get records from her 2008 colonoscopy sent over. She has chronic constipation and I would like to try her on amitiza twice daily at 8 micrograms per pill. I've also recommended she start drinking cup of coffee in the morning and perhaps have some exercise around the same time. These can all stimulate her bowels to move. She will return to see me in 5-6 weeks and sooner if needed.  Patient Instructions: 1)  We will get records from most recent colonoscopy 03/2007, [place]. 2)  We will get you back in to see Dr. Lindie Spruce about abd hernia repair since it hurts periodically. 3)  Trial of amitza, take it twice a day with food.  Try cutting back on the  daily senekot. 4)  Return in 5-6 weeks to see Dr. Christella Hartigan. 5)  Popcorn, seeds, nuts, berries are all safe for diveritulosis. 6)  Drink a cup of coffee every morning, brisk exercise around the same time can stimulate your bowels to move. 7)  The medication list was reviewed and reconciled.  All changed / newly prescribed medications were explained.  A complete medication list was provided to the patient / caregiver. Prescriptions: AMITIZA 8 MCG  CAPS (LUBIPROSTONE) 1 two times a day/take with food and water  #60 x 2   Entered and Authorized by:   Rachael Fee MD   Signed by:   Rachael Fee MD on 04/30/2010   Method used:   Electronically to        [facility] [phone][facility] (retail)       [address]       [place], [place]  [address]       Ph: [phone] or [phone]       Fax: [phone]   RxID:   [id number]   Appended Document:  recieved reports: Colonoscopy  01/2004 [place], [place] Dr. Raphael Gibney; small hyperplastic polyp in rectum removed, tortuous colon, left sided tics.  Colonoscopy 03/2007 [place] Dr. Raphael Gibney; no polyps, +tics, +hemorrhoids.  patty, she is ok for recall colonoscopy 03/2017  Appended Document: recall colon recall in IDX and EMR   Clinical Lists Changes  Observations: Added new observation of COLONNXTDUE: 03/2017 (05/03/2010 15:16)

## 2010-06-25 NOTE — Letter (Signed)
Summary: New Patient letter  [facility]  [address] [place], [place] [address]   Phone: [phone]  Fax: [phone]       03/19/2010 MRN: [id number]  [facility] [address] [place], [place]  [address]  Dear Ms. Lessley,  Welcome to the Gastroenterology Division at [facility].    You are scheduled to see Dr.  Christella Hartigan on 04-30-10 at 11am on the 3rd floor at [facility], [address]  We ask that you try to arrive at our office 15 minutes prior to your appointment time to allow for check-in.  We would like you to complete the enclosed self-administered evaluation form prior to your visit and bring it with you on the day of your appointment.  We will review it with you.  Also, please bring a complete list of all your medications or, if you prefer, bring the medication bottles and we will list them.  Please bring your insurance card so that we may make a copy of it.  If your insurance requires a referral to see a specialist, please bring your referral form from your primary care physician.  Co-payments are due at the time of your visit and may be paid by cash, check or credit card.     Your office visit will consist of a consult with your physician (includes a physical exam), any laboratory testing he/she may order, scheduling of any necessary diagnostic testing (e.g. x-ray, ultrasound, CT-scan), and scheduling of a procedure (e.g. Endoscopy, Colonoscopy) if required.  Please allow enough time on your schedule to allow for any/all of these possibilities.    If you cannot keep your appointment, please call [phone] to cancel or reschedule prior to your appointment date.  This allows [place] the opportunity to schedule an appointment for another patient in need of care.  If you do not cancel or reschedule by 5 p.m. the business day prior to your appointment date, you will be charged a $50.00 late cancellation/no-show fee.    Thank you for choosing  [facility] for your medical needs.  We appreciate the opportunity to care for you.  Please visit [place] at our website  to learn more about our practice.                     Sincerely,                                                             The Gastroenterology Division

## 2010-06-26 ENCOUNTER — Encounter: Payer: Self-pay | Admitting: Family Medicine

## 2010-06-27 NOTE — Procedures (Signed)
Summary: [facility]oscopy / [facility]  Colonoscopy / [facility]   Imported By: Lennie Odor 05/08/2010 15:24:01  _____________________________________________________________________  External Attachment:    Type:   Image     Comment:   External Document

## 2010-06-27 NOTE — Progress Notes (Signed)
Summary: Lab results  Phone Note Call from Patient        -  Date:  05/29/2010    WBC: 7.8    HGB: 10.5    HCT: 33.1    RBC: 3.51    PLT: 279    MCV: 94.3    RDW: 13.6

## 2010-06-27 NOTE — Assessment & Plan Note (Signed)
Review of gastrointestinal problems: 1. Routine risk for colon cancer:  Colonoscopy 01/2004 [place], [place] Dr. Raphael Gibney; small hyperplastic polyp in rectum removed, tortuous colon, left sided tics.  Colonoscopy 03/2007 [place] Dr. Raphael Gibney; no polyps, +tics, +hemorrhoids. Next recall colonoscopy 03/2017. 2. Chronic constipation: On multiple laxatives, stool softeners for years; December 2011 started amitza at 8 micrograms twice daily, slow improvement. January 2011 increased Amitiza To 24 micrograms twice daily   History of Present Illness Visit Type: Follow-up Visit Primary GI MD: Rob Bunting MD Primary Provider: Danise Edge, MD Requesting Provider: Danise Edge, MD  Chief Complaint: bowel issues History of Present Illness:     very pleasant 60 year old woman whom I last saw about 6 weeks ago.  she is on amitiza twice a day.  still taking 5 sennekot a day.  Was taking 12 stool softeners previously as well as Senokot. I think she is slowly improving.  Since her visit with me she met with a surgeon to discuss abdominal wall hernia repair. She is planning to do that surgery later this year.           Current Medications (verified): 1)  Warfarin Sodium 5 Mg Tabs (Warfarin Sodium) .... 1/2 To 1 Tablet By Mouth Daily 2)  Acetaminophen 325 Mg Tabs (Acetaminophen) .... 2 Tablets Po As Needed For Pain 3)  Amiodarone Hcl 200 Mg Tabs (Amiodarone Hcl) .... 1/2 Tablet By Mouth Daily 4)  Aspir-Low 81 Mg Tbec (Aspirin) .... Once Daily 5)  Clonazepam 0.5 Mg Tabs (Clonazepam) .... 1/2 Tab By Mouth Two Times A Day 6)  Fenofibrate 160 Mg Tabs (Fenofibrate) .Marland Kitchen.. 1 Tablet By Mouth Daily 7)  Lamictal 150 Mg Tabs (Lamotrigine) .... 1/2 Tablet By Mouth Daily 8)  Lisinopril-Hydrochlorothiazide 10-12.5 Mg Tabs (Lisinopril-Hydrochlorothiazide) .Marland Kitchen.. 1 Tablet By Mouth Daily 9)  Pravachol 40 Mg Tabs (Pravastatin Sodium) .Marland Kitchen.. 1 Tablet By Mouth At Bedtime 10)  Seroquel 200 Mg Tabs (Quetiapine Fumarate) .Marland Kitchen.. 1-2 Tablets  By Mouth At Bedtime 11)  Hemocyte-F 324-1 Mg Tabs (Ferrous Fumarate-Folic Acid) .... Once Daily 12)  Hyoscyamine Sulfate 0.125 Mg Tabs (Hyoscyamine Sulfate) .Marland Kitchen.. 1 Tab Sl As Needed Abdominal Pain Q 2-4 Hours 13)  Senokot 8.6 Mg Tabs (Sennosides) .... Five  Tablet By Mouth  At Bedtime 14)  Amitiza 8 Mcg  Caps (Lubiprostone) .Marland Kitchen.. 1 Two Times A Day/take With Food and Water 15)  Cymbalta 30 Mg Cpep (Duloxetine Hcl) .... Take One Capsule Daily  Allergies (verified): 1)  ! Sulfa  Vital Signs:  Patient profile:   60 year old female Menstrual status:  hysterectomy Height:      67.25 inches Weight:      210.25 pounds BMI:     32.80 Pulse rate:   60 / minute Pulse rhythm:   regular BP sitting:   120 / 64  (left arm) Cuff size:   regular  Vitals Entered By: June McMurray CMA Duncan Dull) (June 11, 2010 10:55 AM)  Physical Exam  Additional Exam:  Constitutional: generally well appearing Psychiatric: alert and oriented times 3 Abdomen: soft, non-tender, non-distended, normal bowel sounds    Impression & Recommendations:  Problem # 1:  chronic constipation I think she is responding to [place] however she still requires Senokot, 5 pills at night. We will increase the dose of her amitiza and hopefully she can gradually wean back from the laxatives. She will call in 3 months to report on her symptoms.  Parenthetically, her husband was not happy with his previous gastroenterologist and wants to switch  care to me. I am happy to see him as a patient he will work on getting whatever paperwork as needed sent over.  Patient Instructions: 1)  Switch from amitiza pills up to pills (take 1 pill twice a day). 2)  Try to back down on the laxatives slowly over time (one per month). 3)  Call Dr. Christella Hartigan in 3 months to report on your symptoms. 4)  Have your husband set himself up as a new paitent with Dr. Christella Hartigan for his GI care (he wants to switch from Dr. Madilyn Fireman' care).  He will need to bring his  GI records from Dr. Madilyn Fireman and [place] as well.  5)  The medication list was reviewed and reconciled.  All changed / newly prescribed medications were explained.  A complete medication list was provided to the patient / caregiver. Prescriptions: AMITIZA 24 MCG  CAPS (LUBIPROSTONE) 1 two times a day/take with food and water  #60 x 3   Entered and Authorized by:   Rachael Fee MD   Signed by:   Rachael Fee MD on 06/11/2010   Method used:   [facility] [phone][facility] (retail[facility]       [address]       [place], [place]  [address]       Ph: [phone] or [phone]       Fax: [phone]   RxID:   [id number]

## 2010-06-27 NOTE — Assessment & Plan Note (Signed)
Summary: 6 wek rov /resched vfw   Vital Signs:  Patient profile:   60 year old female Menstrual status:  hysterectomy Height:      67.25 inches (170.82 cm) Weight:      207 pounds (94.09 kg) O2 Sat:      100 % on Room air Temp:     98.0 degrees F (36.67 degrees C) oral Pulse rate:   71 / minute BP sitting:   134 / 70  (left arm) Cuff size:   regular  Vitals Entered By: Josph Macho RMA (May 28, 2010 8:52 AM)  O2 Flow:  Room air CC: 6 week follow up/ CF Is Patient Diabetic? No   History of Present Illness: patient is a 60 year old Caucasian female in today for followup on constipation and multiple other medical problems. She reports overall she is doing well. Denies any recent illness, fevers, chills, chest pain, palpitations, shortness of breath, GU complaints. She continues to struggle with constipation although it is improving she's been seen by gastroenterology abdominal wall hernia defect was noted and she's been referred back to her surgeon. They're willing to proceed with surgical correction but none at the present time until we get her Coumadin levels better regulated. For now she is wearing a brace for her she finds it helps her discomfort some. She has been started on them she is 8 mg p.o. b.i.d. but still continues her current status as well. Now moves her bowels daily but often feels as if it's incomplete evacuation discontinued and some cramping most notably in the left lower quadrant but it is more tolerable, no bloody or tarry stool noted  Current Medications (verified): 1)  Warfarin Sodium 5 Mg Tabs (Warfarin Sodium) .... 1/2 To 1 Tablet By Mouth Daily 2)  Acetaminophen 325 Mg Tabs (Acetaminophen) .... 2 Tablets Po As Needed For Pain 3)  Amiodarone Hcl 200 Mg Tabs (Amiodarone Hcl) .... 1/2 Tablet By Mouth Daily 4)  Aspir-Low 81 Mg Tbec (Aspirin) .... Once Daily 5)  Clonazepam 0.5 Mg Tabs (Clonazepam) .... 1/2 Tab By Mouth Two Times A Day 6)  Fenofibrate 160 Mg  Tabs (Fenofibrate) .Marland Kitchen.. 1 Tablet By Mouth Daily 7)  Lamictal 150 Mg Tabs (Lamotrigine) .... 1/2 Tablet By Mouth Daily 8)  Lisinopril-Hydrochlorothiazide 10-12.5 Mg Tabs (Lisinopril-Hydrochlorothiazide) .Marland Kitchen.. 1 Tablet By Mouth Daily 9)  Pravachol 40 Mg Tabs (Pravastatin Sodium) .Marland Kitchen.. 1 Tablet By Mouth At Bedtime 10)  Seroquel 200 Mg Tabs (Quetiapine Fumarate) .Marland Kitchen.. 1-2 Tablets By Mouth At Bedtime 11)  Hemocyte-F 324-1 Mg Tabs (Ferrous Fumarate-Folic Acid) .... Once Daily 12)  Hyoscyamine Sulfate 0.125 Mg Tabs (Hyoscyamine Sulfate) .Marland Kitchen.. 1 Tab Sl As Needed Abdominal Pain Q 2-4 Hours 13)  Senokot 8.6 Mg Tabs (Sennosides) .... Five  Tablet By Mouth  At Bedtime 14)  Amitiza 8 Mcg  Caps (Lubiprostone) .Marland Kitchen.. 1 Two Times A Day/take With Food and Water 15)  Cymbalta 30 Mg Cpep (Duloxetine Hcl) .... Take One Capsule Daily  Allergies (verified): No Known Drug Allergies  Past History:  Past medical history reviewed for relevance to current acute and chronic problems. Social history (including risk factors) reviewed for relevance to current acute and chronic problems.  Past Medical History: Reviewed history from 04/30/2010 and no changes required. 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 No apparent alopecia or balding. Mouth:  Oral mucosa and oropharynx without lesions or exudates.  Teeth in good repair. Lungs:  Normal respiratory effort, chest  expands symmetrically. Lungs are clear to auscultation, no crackles or wheezes. Heart:  irregular rhythm and systolic click.  Regular rate Abdomen:  soft, non-tender, normal bowel sounds, and no distention.  Wearing a brace Extremities:  No clubbing, cyanosis, edema, noted with normal full range of motion of all joints.  Swelling noted over anterior right knee roughly 2 cm in diameter, mildly tender, no warmth or erythema. Some yellowing ecchymosis noted around knee and down tibial plateau. No other knee joint abnormality or joint line tenderness. lesion nonmobile and semisoft. Psych:  Cognition and judgment appear intact. Alert and cooperative with normal attention span and concentration. No apparent delusions, illusions, hallucinations   Impression & Recommendations:  Problem # 1:  ANEMIA (ICD-285.9)  Her updated medication list for this problem includes:    Hemocyte-f 324-1 Mg Tabs (Ferrous fumarate-folic acid) ..... Once daily  Orders: TLB-CBC Platelet - w/Differential (85025-CBCD) Will repeat CBC and continue Hemocyte for now  Problem # 2:  KNEE PAIN, RIGHT (ICD-719.46)  Her updated medication list for this problem includes:    Acetaminophen 325 Mg Tabs (Acetaminophen) .Marland Kitchen... 2 tablets po as needed for pain    Aspir-low 81 Mg Tbec (Aspirin) ..... Once daily  Orders: T-Knee Right 2 view (73560TC) and swelling, traumatic vs Lipoma, encouraged ice and Aspercreme three times a day and call if no resolution  Problem # 3:  CONSTIPATION (ICD-564.00)  Her updated medication list for this problem includes:    Senokot 8.6 Mg Tabs (Sennosides) .Marland Kitchen... Five  tablet by mouth  at bedtime Sees GI now and has been placed on Amitiza 8mg  by mouth two times a day and now moves her bowels daily but often feels she has incomplete evacuation and some milder abdominal cramping still, mostly in left lower quadrant. encouraged her to cont Align and add a yogurt daily. Split up her Senna dosing to 2 tabs by  mouth two times a day and follow up with GI, increase fluids, fiber and exercise  Problem # 4:  AORTIC VALVE REPLACEMENT, HX OF (ICD-V43.3) Continues on Coumadin is monitored by her cardiologist and is doing well at this time  Complete Medication List: 1)  Warfarin Sodium 5 Mg Tabs (Warfarin sodium) .... 1/2 to 1 tablet by mouth daily 2)  Acetaminophen 325 Mg Tabs (Acetaminophen) .... 2 tablets po as needed for pain 3)  Amiodarone Hcl 200 Mg Tabs (Amiodarone hcl) .... 1/2 tablet by mouth daily 4)  Aspir-low 81 Mg Tbec (Aspirin) .... Once daily 5)  Clonazepam 0.5 Mg Tabs (Clonazepam) .... 1/2 tab by mouth two times a day 6)  Fenofibrate 160 Mg Tabs (Fenofibrate) .Marland Kitchen.. 1 tablet by mouth daily 7)  Lamictal 150 Mg Tabs (Lamotrigine) .... 1/2 tablet by mouth daily 8)  Lisinopril-hydrochlorothiazide 10-12.5 Mg Tabs (Lisinopril-hydrochlorothiazide) .Marland Kitchen.. 1 tablet by mouth daily 9)  Pravachol 40 Mg Tabs (Pravastatin sodium) .Marland Kitchen.. 1 tablet by mouth at bedtime 10)  Seroquel 200 Mg Tabs (Quetiapine fumarate) .Marland Kitchen.. 1-2 tablets by mouth at bedtime 11)  Hemocyte-f 324-1 Mg Tabs (Ferrous fumarate-folic acid) .... Once daily 12)  Hyoscyamine Sulfate  0.125 Mg Tabs (Hyoscyamine sulfate) .Marland Kitchen.. 1 tab sl as needed abdominal pain q 2-4 hours 13)  Senokot 8.6 Mg Tabs (Sennosides) .... Five  tablet by mouth  at bedtime 14)  Amitiza 8 Mcg Caps (Lubiprostone) .Marland Kitchen.. 1 two times a day/take with food and water 15)  Cymbalta 30 Mg Cpep (Duloxetine hcl) .... Take one capsule daily  Patient Instructions: 1)  Please schedule a follow-up appointment in 2 to 3 months.  2)  For right knee ice for roughly 15 minutes 3 x daily and apply Aspercreme following this, call if no resolution or symptoms worsen.  3)  For bowels consider adding a yogurt daily   Orders Added: 1)  T-Knee Right 2 view [73560TC] 2)  TLB-CBC Platelet - w/Differential [85025-CBCD] 3)  Est. Patient Level IV [78469]

## 2010-06-27 NOTE — Procedures (Signed)
Summary: Colonoscopy / [facility]  Colonoscopy / [facility]   Imported By: Lennie Odor 05/08/2010 15:41:39  _____________________________________________________________________  External Attachment:    Type:   Image     Comment:   External Document

## 2010-07-22 ENCOUNTER — Encounter: Payer: Self-pay | Admitting: Family Medicine

## 2010-07-22 ENCOUNTER — Ambulatory Visit (INDEPENDENT_AMBULATORY_CARE_PROVIDER_SITE_OTHER): Payer: PRIVATE HEALTH INSURANCE | Admitting: Family Medicine

## 2010-07-22 DIAGNOSIS — R05 Cough: Secondary | ICD-10-CM | POA: Insufficient documentation

## 2010-07-22 DIAGNOSIS — R059 Cough, unspecified: Secondary | ICD-10-CM

## 2010-07-22 DIAGNOSIS — K5730 Diverticulosis of large intestine without perforation or abscess without bleeding: Secondary | ICD-10-CM

## 2010-07-22 DIAGNOSIS — J209 Acute bronchitis, unspecified: Secondary | ICD-10-CM

## 2010-07-22 DIAGNOSIS — E049 Nontoxic goiter, unspecified: Secondary | ICD-10-CM

## 2010-07-22 DIAGNOSIS — R1084 Generalized abdominal pain: Secondary | ICD-10-CM

## 2010-07-23 ENCOUNTER — Other Ambulatory Visit: Payer: Self-pay | Admitting: Family Medicine

## 2010-07-23 DIAGNOSIS — R05 Cough: Secondary | ICD-10-CM

## 2010-07-23 DIAGNOSIS — R059 Cough, unspecified: Secondary | ICD-10-CM

## 2010-07-23 DIAGNOSIS — E010 Iodine-deficiency related diffuse (endemic) goiter: Secondary | ICD-10-CM

## 2010-07-24 ENCOUNTER — Encounter: Payer: Self-pay | Admitting: Family Medicine

## 2010-07-25 ENCOUNTER — Ambulatory Visit
Admission: RE | Admit: 2010-07-25 | Discharge: 2010-07-25 | Disposition: A | Discharge status: Home / Self Care | Payer: PRIVATE HEALTH INSURANCE | Source: Ambulatory Visit | Attending: Family Medicine | Admitting: Family Medicine

## 2010-07-25 ENCOUNTER — Encounter: Payer: Self-pay | Admitting: Family Medicine

## 2010-07-25 DIAGNOSIS — R05 Cough: Secondary | ICD-10-CM

## 2010-07-25 DIAGNOSIS — E010 Iodine-deficiency related diffuse (endemic) goiter: Secondary | ICD-10-CM

## 2010-07-25 DIAGNOSIS — R059 Cough, unspecified: Secondary | ICD-10-CM

## 2010-07-26 ENCOUNTER — Other Ambulatory Visit: Payer: PRIVATE HEALTH INSURANCE

## 2010-07-29 ENCOUNTER — Other Ambulatory Visit: Payer: PRIVATE HEALTH INSURANCE

## 2010-07-29 ENCOUNTER — Telehealth (INDEPENDENT_AMBULATORY_CARE_PROVIDER_SITE_OTHER): Payer: Self-pay | Admitting: *Deleted

## 2010-08-01 NOTE — Assessment & Plan Note (Signed)
Summary: patient has had a cough since february 16,2012   Vital Signs:  Patient profile:   60 year old female Menstrual status:  hysterectomy Height:      67.25 inches (170.82 cm) Weight:      210.75 pounds (95.80 kg) O2 Sat:      99 % on Room air Temp:     98.3 degrees F (36.83 degrees C) oral Pulse rate:   59 / minute BP sitting:   113 / 69  (right arm) Cuff size:   regular  Vitals Entered By: Josph Macho RMA (July 22, 2010 1:55 PM)  O2 Flow:  Room air CC: Pt has had a cough since 2-16 w/phlegm (in the morning greenish-yellow then goes to thick white during the day), wheezing/ Lower stomach hurts (hernia)/ CF Is Patient Diabetic? No   History of Present Illness: Patient is a 60 yo Caucasian female who is in today for reevaluation of multiple concerns. She has been struggling with a persistent cough, intermittently productuve of green-yellow sputum since mid February. She also notes some wheezing but denies any fevers/chills/ear pain or sore throat. No CP/palp but she has noted some SOB at times. No dysphaghia/vomitting/bloody or tarry stool. No GU c/o. She has had some anorrexia and nausea at times. No malaise/myalgias. Notes some fatigue. Denies any heartburn or sour taste in the throat.  Current Medications (verified): 1)  Warfarin Sodium 5 Mg Tabs (Warfarin Sodium) .... 1/2 To 1 Tablet By Mouth Daily 2)  Acetaminophen 325 Mg Tabs (Acetaminophen) .... 2 Tablets Po As Needed For Pain 3)  Amiodarone Hcl 200 Mg Tabs (Amiodarone Hcl) .... 1/2 Tablet By Mouth Daily 4)  Aspir-Low 81 Mg Tbec (Aspirin) .... Once Daily 5)  Clonazepam 0.5 Mg Tabs (Clonazepam) .... 1/2 Tab By Mouth Two Times A Day 6)  Fenofibrate 160 Mg Tabs (Fenofibrate) .Marland Kitchen.. 1 Tablet By Mouth Daily 7)  Lamictal 150 Mg Tabs (Lamotrigine) .... 1/2 Tablet By Mouth Daily 8)  Lisinopril-Hydrochlorothiazide 10-12.5 Mg Tabs (Lisinopril-Hydrochlorothiazide) .Marland Kitchen.. 1 Tablet By Mouth Daily 9)  Pravachol 40 Mg Tabs  (Pravastatin Sodium) .Marland Kitchen.. 1 Tablet By Mouth At Bedtime 10)  Seroquel 200 Mg Tabs (Quetiapine Fumarate) .Marland Kitchen.. 1-2 Tablets By Mouth At Bedtime 11)  Hemocyte-F 324-1 Mg Tabs (Ferrous Fumarate-Folic Acid) .... Once Daily 12)  Hyoscyamine Sulfate 0.125 Mg Tabs (Hyoscyamine Sulfate) .Marland Kitchen.. 1 Tab Sl As Needed Abdominal Pain Q 2-4 Hours 13)  Senokot 8.6 Mg Tabs (Sennosides) .... Five  Tablet By Mouth  At Bedtime 14)  Amitiza 24 Mcg  Caps (Lubiprostone) .Marland Kitchen.. 1 Two Times A Day/take With Food and Water 15)  Cymbalta 30 Mg Cpep (Duloxetine Hcl) .... Take One Capsule Daily  Allergies (verified): 1)  ! Sulfa  Past History:  Past medical history reviewed for relevance to current acute and chronic problems. Social history (including risk factors) reviewed for relevance to current acute and chronic problems.  Past Medical History: Reviewed history from 04/30/2010 and no changes required. 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 Occupation: retail at [employer] Married Former Smoker: quit a light habit 20 years ago Alcohol use-yes, very special occasions Drug use-no Regular exercise-no Wears a seat belt    Review of Systems      See HPI  Physical Exam  General:  Well-developed,well-nourished,in no acute distress; alert,appropriate and cooperative throughout examination Head:  Normocephalic and atraumatic without obvious abnormalities. No apparent alopecia or balding.  Mouth:  Oral mucosa and oropharynx without lesions or exudates.  Teeth in good repair. Neck:  supple and full ROM.   Lungs:  R base crackles and L base crackles.   Heart:  Normal rate and regular rhythm. S1 and S2 normal without gallop, rub or other extra sounds.systolic click.   Abdomen:  abdominal scar(s) and umbilical hernia.  Large ventral hernia from umbilicus down to suprapubic region.  Tender to palp, but soft and reducible. Patient also tender to palp throughout abdomen  Extremities:  No clubbing, cyanosis, edema, or deformity noted with normal full range of motion of all joints.   Cervical Nodes:  No lymphadenopathy noted Psych:  slightly anxious but pleasant and cooperative   Detailed Thyroid Exam    Inspection: visible goiter.      Palpation: diffuse goiter.     Impression & Recommendations:  Problem # 1:  ACUTE BRONCHITIS (ICD-466.0)  Her updated medication list for this problem includes:    Cipro 500 Mg Tabs (Ciprofloxacin hcl) .Marland Kitchen... 1 tab by mouth two times a day x 10 day    Flagyl 500 Mg Tabs (Metronidazole) .Marland Kitchen... 1 tab by mouth three times a day x 10 days Given tylenol with Codeine to help with cough as needed and for pain as needed in moderation  Problem # 2:  ABDOMINAL PAIN, GENERALIZED (ICD-789.07)  Her updated medication list for this problem includes:    Amitiza 24 Mcg Caps (Lubiprostone) .Marland Kitchen... 1 two times a day/take with food and water Constipation is improving on Amitiza, only needing 1 Senokot daily with the Amitiza. Now pain is worsening with coughing. She is struggling with some anorexia and she is aware she needs to herve her umbilical hernia repaired but she has been trying to stall until her daughter gets through this semester of colleg, so she can continue to watch the kids for her. She is warned she may need to proceed more quickly if pain is persistent. With her history of Diverticulosis need to consider a diverticular flare and will treat with Cipro and Flagyl. Encouraged ongoing Probiotic use  Problem # 3:  THYROMEGALY (ICD-240.9)  Orders: Radiology Referral (Radiology) Ultrasound ordered, patient will call if symptoms worsen for further evaluation.  Complete Medication List: 1)  Warfarin Sodium 5 Mg Tabs (Warfarin sodium) .... 1/2 to 1 tablet by mouth daily 2)  Acetaminophen 325 Mg Tabs (Acetaminophen) .... 2 tablets po as needed for  pain 3)  Amiodarone Hcl 200 Mg Tabs (Amiodarone hcl) .... 1/2 tablet by mouth daily 4)  Aspir-low 81 Mg Tbec (Aspirin) .... Once daily 5)  Clonazepam 0.5 Mg Tabs (Clonazepam) .... 1/2 tab by mouth two times a day 6)  Fenofibrate 160 Mg Tabs (Fenofibrate) .Marland Kitchen.. 1 tablet by mouth daily 7)  Lamictal 150 Mg Tabs (Lamotrigine) .... 1/2 tablet by mouth daily 8)  Lisinopril-hydrochlorothiazide 10-12.5 Mg Tabs (Lisinopril-hydrochlorothiazide) .Marland Kitchen.. 1 tablet by mouth daily 9)  Pravachol 40 Mg Tabs (Pravastatin sodium) .Marland Kitchen.. 1 tablet by mouth at bedtime 10)  Seroquel 200 Mg Tabs (Quetiapine fumarate) .Marland Kitchen.. 1-2 tablets by mouth at bedtime 11)  Hemocyte-f 324-1 Mg Tabs (Ferrous fumarate-folic acid) .... Once daily 12)  Senokot 8.6 Mg Tabs (Sennosides) .... Five  tablet by mouth  at bedtime 13)  Amitiza 24 Mcg Caps (Lubiprostone) .Marland Kitchen.. 1 two times a day/take with food and water 14)  Cymbalta 30 Mg Cpep (Duloxetine hcl) .... Take one capsule daily 15)  Cipro 500 Mg Tabs (Ciprofloxacin hcl) .Marland Kitchen.. 1 tab by mouth two  times a day x 10 day 16)  Flagyl 500 Mg Tabs (Metronidazole) .Marland Kitchen.. 1 tab by mouth three times a day x 10 days 17)  Tylenol With Codeine #3 300-30 Mg Tabs (Acetaminophen-codeine) .Marland Kitchen.. 1 tab by mouth q 6 hours as needed cough/pain  Patient Instructions: 1)  Please schedule a follow-up appointment in 2 weeks.  2)  continue the probiotic, maintain good hydration. 3)  Acute Bronchitis symptoms for less then 10 days are not  helped by antibiotics. Take over the counter cough medications. Call if no improvement in 5-7 days, sooner if increasing cough, fever, or new symptoms ( shortness of breath, chest pain) .  Prescriptions: TYLENOL WITH CODEINE #3 300-30 MG TABS (ACETAMINOPHEN-CODEINE) 1 tab by mouth q 6 hours as needed cough/pain  #40 x 1   Entered and Authorized by:   Danise Edge MD   Signed by:   Danise Edge MD on 07/22/2010   Method used:   Print then Give to Patient   RxID:    [id number] FLAGYL 500 MG TABS (METRONIDAZOLE) 1 tab by mouth three times a day x 10 days  #30 x 0   Entered and Authorized by:   Danise Edge MD   Signed by:   Danise Edge MD on 07/22/2010   Method used:   Electronically to        [facility] [phone]* (retail)       [address]       [place], [place]  [address]       Ph: [phone] or [phone]       Fax: [phone]   RxID:   [id number] CIPRO 500 MG TABS (CIPROFLOXACIN HCL) 1 tab by mouth two times a day x 10 day  #20 x 0   Entered and Authorized by:   Danise Edge MD   Signed by:   Danise Edge MD on 07/22/2010   Method used:   Electronically to        [facility] [phone]* (retail)       [address]       [place], [place]  [address]       Ph: [phone] or [phone]       Fax: [phone]   RxID:   [id number]    Orders Added: 1)  Radiology Referral [Radiology] 2)  Est. Patient Level IV [[address]]

## 2010-08-05 ENCOUNTER — Encounter: Payer: Self-pay | Admitting: Family Medicine

## 2010-08-05 ENCOUNTER — Ambulatory Visit (INDEPENDENT_AMBULATORY_CARE_PROVIDER_SITE_OTHER): Payer: PRIVATE HEALTH INSURANCE | Admitting: Family Medicine

## 2010-08-05 DIAGNOSIS — R197 Diarrhea, unspecified: Secondary | ICD-10-CM | POA: Insufficient documentation

## 2010-08-05 DIAGNOSIS — Z954 Presence of other heart-valve replacement: Secondary | ICD-10-CM

## 2010-08-05 DIAGNOSIS — D649 Anemia, unspecified: Secondary | ICD-10-CM

## 2010-08-05 DIAGNOSIS — J209 Acute bronchitis, unspecified: Secondary | ICD-10-CM

## 2010-08-05 DIAGNOSIS — R1084 Generalized abdominal pain: Secondary | ICD-10-CM

## 2010-08-06 ENCOUNTER — Encounter: Payer: Self-pay | Admitting: Family Medicine

## 2010-08-06 NOTE — Progress Notes (Signed)
  Phone Note Call from Patient Call back at [facility] Phone [phone]   Summary of Call: Pt called stating she is vomiting (today)and had diarrhea yesterday. Pt is wandering if this has anything to do with her hernias?r. Pt has consultation appt with surgeon tomorrow morning. Initial call taken by: Josph Macho RMA,  July 29, 2010 12:29 PM  Follow-up for Phone Call        No help with hernias except some pressure, if the pain gets too bad or vomitting won't stop may need to go to ER Follow-up by: Danise Edge MD,  July 29, 2010 1:02 PM  Additional Follow-up for Phone Call Additional follow up Details #1::        Pt informed. Additional Follow-up by: Josph Macho RMA,  July 29, 2010 1:19 PM

## 2010-08-07 ENCOUNTER — Encounter: Payer: Self-pay | Admitting: Family Medicine

## 2010-08-08 ENCOUNTER — Encounter: Payer: Self-pay | Admitting: Family Medicine

## 2010-08-08 ENCOUNTER — Telehealth (INDEPENDENT_AMBULATORY_CARE_PROVIDER_SITE_OTHER): Payer: Self-pay | Admitting: *Deleted

## 2010-08-08 LAB — DIFFERENTIAL
Basophils Absolute: 0 10*3/uL (ref 0.0–0.1)
Basophils Absolute: 0.1 10*3/uL (ref 0.0–0.1)
Basophils Absolute: 0.1 10*3/uL (ref 0.0–0.1)
Basophils Relative: 0 % (ref 0–1)
Basophils Relative: 0 % (ref 0–1)
Basophils Relative: 1 % (ref 0–1)
Eosinophils Absolute: 0 10*3/uL (ref 0.0–0.7)
Eosinophils Absolute: 0.1 10*3/uL (ref 0.0–0.7)
Eosinophils Absolute: 0.1 10*3/uL (ref 0.0–0.7)
Eosinophils Relative: 0 % (ref 0–5)
Eosinophils Relative: 0 % (ref 0–5)
Eosinophils Relative: 0 % (ref 0–5)
Lymphocytes Relative: 7 % — ABNORMAL LOW (ref 12–46)
Lymphocytes Relative: 7 % — ABNORMAL LOW (ref 12–46)
Lymphocytes Relative: 8 % — ABNORMAL LOW (ref 12–46)
Lymphs Abs: 0.9 10*3/uL (ref 0.7–4.0)
Lymphs Abs: 1.4 10*3/uL (ref 0.7–4.0)
Lymphs Abs: 1.1 10*3/uL (ref 0.7–4.0)
Monocytes Absolute: 1 10*3/uL (ref 0.1–1.0)
Monocytes Absolute: 1.3 10*3/uL — ABNORMAL HIGH (ref 0.1–1.0)
Monocytes Absolute: 0.8 10*3/uL (ref 0.1–1.0)
Monocytes Relative: 7 % (ref 3–12)
Monocytes Relative: 8 % (ref 3–12)
Monocytes Relative: 6 % (ref 3–12)
Neutro Abs: 10.6 10*3/uL — ABNORMAL HIGH (ref 1.7–7.7)
Neutro Abs: 16.9 10*3/uL — ABNORMAL HIGH (ref 1.7–7.7)
Neutro Abs: 10.7 10*3/uL — ABNORMAL HIGH (ref 1.7–7.7)
Neutrophils Relative %: 85 % — ABNORMAL HIGH (ref 43–77)
Neutrophils Relative %: 86 % — ABNORMAL HIGH (ref 43–77)
Neutrophils Relative %: 85 % — ABNORMAL HIGH (ref 43–77)

## 2010-08-08 LAB — CBC
HCT: 25 % — ABNORMAL LOW (ref 36.0–46.0)
HCT: 26.9 % — ABNORMAL LOW (ref 36.0–46.0)
HCT: 27.5 % — ABNORMAL LOW (ref 36.0–46.0)
HCT: 28.3 % — ABNORMAL LOW (ref 36.0–46.0)
HCT: 23.4 % — ABNORMAL LOW (ref 36.0–46.0)
HCT: 24.6 % — ABNORMAL LOW (ref 36.0–46.0)
HCT: 28 % — ABNORMAL LOW (ref 36.0–46.0)
HCT: 28.2 % — ABNORMAL LOW (ref 36.0–46.0)
HCT: 28.9 % — ABNORMAL LOW (ref 36.0–46.0)
HCT: 29.4 % — ABNORMAL LOW (ref 36.0–46.0)
HCT: 30.6 % — ABNORMAL LOW (ref 36.0–46.0)
HCT: 31.2 % — ABNORMAL LOW (ref 36.0–46.0)
HCT: 33.2 % — ABNORMAL LOW (ref 36.0–46.0)
HCT: 33.2 % — ABNORMAL LOW (ref 36.0–46.0)
Hemoglobin: 8.2 g/dL — ABNORMAL LOW (ref 12.0–15.0)
Hemoglobin: 8.8 g/dL — ABNORMAL LOW (ref 12.0–15.0)
Hemoglobin: 8.9 g/dL — ABNORMAL LOW (ref 12.0–15.0)
Hemoglobin: 9.1 g/dL — ABNORMAL LOW (ref 12.0–15.0)
Hemoglobin: 10.3 g/dL — ABNORMAL LOW (ref 12.0–15.0)
Hemoglobin: 10.4 g/dL — ABNORMAL LOW (ref 12.0–15.0)
Hemoglobin: 10.9 g/dL — ABNORMAL LOW (ref 12.0–15.0)
Hemoglobin: 7.9 g/dL — ABNORMAL LOW (ref 12.0–15.0)
Hemoglobin: 8.2 g/dL — ABNORMAL LOW (ref 12.0–15.0)
Hemoglobin: 9.3 g/dL — ABNORMAL LOW (ref 12.0–15.0)
Hemoglobin: 9.5 g/dL — ABNORMAL LOW (ref 12.0–15.0)
Hemoglobin: 9.8 g/dL — ABNORMAL LOW (ref 12.0–15.0)
Hemoglobin: 9.8 g/dL — ABNORMAL LOW (ref 12.0–15.0)
Hemoglobin: 11.8 g/dL — ABNORMAL LOW (ref 12.0–15.0)
MCH: 29 pg (ref 26.0–34.0)
MCH: 29 pg (ref 26.0–34.0)
MCH: 29 pg (ref 26.0–34.0)
MCH: 29.4 pg (ref 26.0–34.0)
MCH: 29.3 pg (ref 26.0–34.0)
MCH: 29.3 pg (ref 26.0–34.0)
MCH: 29.5 pg (ref 26.0–34.0)
MCH: 29.7 pg (ref 26.0–34.0)
MCH: 29.8 pg (ref 26.0–34.0)
MCH: 30 pg (ref 26.0–34.0)
MCH: 30.1 pg (ref 26.0–34.0)
MCH: 30.2 pg (ref 26.0–34.0)
MCH: 30.6 pg (ref 26.0–34.0)
MCH: 31.9 pg (ref 26.0–34.0)
MCHC: 32.2 g/dL (ref 30.0–36.0)
MCHC: 32.4 g/dL (ref 30.0–36.0)
MCHC: 32.7 g/dL (ref 30.0–36.0)
MCHC: 32.8 g/dL (ref 30.0–36.0)
MCHC: 32.3 g/dL (ref 30.0–36.0)
MCHC: 32.8 g/dL (ref 30.0–36.0)
MCHC: 33.2 g/dL (ref 30.0–36.0)
MCHC: 33.3 g/dL (ref 30.0–36.0)
MCHC: 33.3 g/dL (ref 30.0–36.0)
MCHC: 33.7 g/dL (ref 30.0–36.0)
MCHC: 33.8 g/dL (ref 30.0–36.0)
MCHC: 33.9 g/dL (ref 30.0–36.0)
MCHC: 34.8 g/dL (ref 30.0–36.0)
MCHC: 35.4 g/dL (ref 30.0–36.0)
MCV: 88.8 fL (ref 78.0–100.0)
MCV: 89.6 fL (ref 78.0–100.0)
MCV: 89.6 fL (ref 78.0–100.0)
MCV: 90.1 fL (ref 78.0–100.0)
MCV: 85.5 fL (ref 78.0–100.0)
MCV: 86.5 fL (ref 78.0–100.0)
MCV: 88.6 fL (ref 78.0–100.0)
MCV: 89.3 fL (ref 78.0–100.0)
MCV: 89.5 fL (ref 78.0–100.0)
MCV: 90.6 fL (ref 78.0–100.0)
MCV: 90.7 fL (ref 78.0–100.0)
MCV: 90.8 fL (ref 78.0–100.0)
MCV: 91.5 fL (ref 78.0–100.0)
MCV: 90.2 fL (ref 78.0–100.0)
Platelets: 414 10*3/uL — ABNORMAL HIGH (ref 150–400)
Platelets: 433 10*3/uL — ABNORMAL HIGH (ref 150–400)
Platelets: 466 10*3/uL — ABNORMAL HIGH (ref 150–400)
Platelets: 483 10*3/uL — ABNORMAL HIGH (ref 150–400)
Platelets: 143 10*3/uL — ABNORMAL LOW (ref 150–400)
Platelets: 161 10*3/uL (ref 150–400)
Platelets: 186 10*3/uL (ref 150–400)
Platelets: 200 10*3/uL (ref 150–400)
Platelets: 248 10*3/uL (ref 150–400)
Platelets: 272 10*3/uL (ref 150–400)
Platelets: 315 10*3/uL (ref 150–400)
Platelets: 446 10*3/uL — ABNORMAL HIGH (ref 150–400)
Platelets: 515 10*3/uL — ABNORMAL HIGH (ref 150–400)
Platelets: 244 10*3/uL (ref 150–400)
RBC: 2.79 MIL/uL — ABNORMAL LOW (ref 3.87–5.11)
RBC: 3.03 MIL/uL — ABNORMAL LOW (ref 3.87–5.11)
RBC: 3.07 MIL/uL — ABNORMAL LOW (ref 3.87–5.11)
RBC: 3.14 MIL/uL — ABNORMAL LOW (ref 3.87–5.11)
RBC: 2.62 MIL/uL — ABNORMAL LOW (ref 3.87–5.11)
RBC: 2.75 MIL/uL — ABNORMAL LOW (ref 3.87–5.11)
RBC: 3.09 MIL/uL — ABNORMAL LOW (ref 3.87–5.11)
RBC: 3.24 MIL/uL — ABNORMAL LOW (ref 3.87–5.11)
RBC: 3.3 MIL/uL — ABNORMAL LOW (ref 3.87–5.11)
RBC: 3.34 MIL/uL — ABNORMAL LOW (ref 3.87–5.11)
RBC: 3.37 MIL/uL — ABNORMAL LOW (ref 3.87–5.11)
RBC: 3.52 MIL/uL — ABNORMAL LOW (ref 3.87–5.11)
RBC: 3.63 MIL/uL — ABNORMAL LOW (ref 3.87–5.11)
RBC: 3.68 MIL/uL — ABNORMAL LOW (ref 3.87–5.11)
RDW: 13.1 % (ref 11.5–15.5)
RDW: 13.2 % (ref 11.5–15.5)
RDW: 13.2 % (ref 11.5–15.5)
RDW: 13.5 % (ref 11.5–15.5)
RDW: 13.4 % (ref 11.5–15.5)
RDW: 13.5 % (ref 11.5–15.5)
RDW: 13.5 % (ref 11.5–15.5)
RDW: 13.5 % (ref 11.5–15.5)
RDW: 13.6 % (ref 11.5–15.5)
RDW: 13.7 % (ref 11.5–15.5)
RDW: 14.1 % (ref 11.5–15.5)
RDW: 14.1 % (ref 11.5–15.5)
RDW: 14.3 % (ref 11.5–15.5)
RDW: 12.5 % (ref 11.5–15.5)
WBC: 13.6 10*3/uL — ABNORMAL HIGH (ref 4.0–10.5)
WBC: 14.4 10*3/uL — ABNORMAL HIGH (ref 4.0–10.5)
WBC: 6.9 10*3/uL (ref 4.0–10.5)
WBC: 8.3 10*3/uL (ref 4.0–10.5)
WBC: 10.3 10*3/uL (ref 4.0–10.5)
WBC: 12.5 10*3/uL — ABNORMAL HIGH (ref 4.0–10.5)
WBC: 17.4 10*3/uL — ABNORMAL HIGH (ref 4.0–10.5)
WBC: 19.7 10*3/uL — ABNORMAL HIGH (ref 4.0–10.5)
WBC: 8.1 10*3/uL (ref 4.0–10.5)
WBC: 8.1 10*3/uL (ref 4.0–10.5)
WBC: 8.2 10*3/uL (ref 4.0–10.5)
WBC: 8.3 10*3/uL (ref 4.0–10.5)
WBC: 9.2 10*3/uL (ref 4.0–10.5)
WBC: 12.8 10*3/uL — ABNORMAL HIGH (ref 4.0–10.5)

## 2010-08-08 LAB — WOUND CULTURE

## 2010-08-08 LAB — PREPARE FRESH FROZEN PLASMA

## 2010-08-08 LAB — BASIC METABOLIC PANEL
BUN: 8 mg/dL (ref 6–23)
BUN: 26 mg/dL — ABNORMAL HIGH (ref 6–23)
BUN: 8 mg/dL (ref 6–23)
CO2: 31 mEq/L (ref 19–32)
CO2: 22 mEq/L (ref 19–32)
CO2: 25 mEq/L (ref 19–32)
Calcium: 8.8 mg/dL (ref 8.4–10.5)
Calcium: 8.5 mg/dL (ref 8.4–10.5)
Calcium: 8.8 mg/dL (ref 8.4–10.5)
Chloride: 106 mEq/L (ref 96–112)
Chloride: 100 mEq/L (ref 96–112)
Chloride: 107 mEq/L (ref 96–112)
Creatinine, Ser: 0.91 mg/dL (ref 0.4–1.2)
Creatinine, Ser: 0.9 mg/dL (ref 0.4–1.2)
Creatinine, Ser: 1.51 mg/dL — ABNORMAL HIGH (ref 0.4–1.2)
GFR calc Af Amer: >60 mL/min (ref >60)
GFR calc Af Amer: 43 mL/min — ABNORMAL LOW (ref >60)
GFR calc Af Amer: >60 mL/min (ref >60)
GFR calc non Af Amer: >60 mL/min (ref >60)
GFR calc non Af Amer: 35 mL/min — ABNORMAL LOW (ref >60)
GFR calc non Af Amer: >60 mL/min (ref >60)
Glucose, Bld: 87 mg/dL (ref 70–99)
Glucose, Bld: 108 mg/dL — ABNORMAL HIGH (ref 70–99)
Glucose, Bld: 129 mg/dL — ABNORMAL HIGH (ref 70–99)
Potassium: 4.1 mEq/L (ref 3.5–5.1)
Potassium: 3.9 mEq/L (ref 3.5–5.1)
Potassium: 4.1 mEq/L (ref 3.5–5.1)
Sodium: 142 mEq/L (ref 135–145)
Sodium: 134 mEq/L — ABNORMAL LOW (ref 135–145)
Sodium: 139 mEq/L (ref 135–145)

## 2010-08-08 LAB — URINALYSIS, ROUTINE W REFLEX MICROSCOPIC
Bilirubin Urine: NEGATIVE
Glucose, UA: NEGATIVE mg/dL
Glucose, UA: NEGATIVE mg/dL
Hgb urine dipstick: NEGATIVE
Hgb urine dipstick: NEGATIVE
Ketones, ur: NEGATIVE mg/dL
Ketones, ur: NEGATIVE mg/dL
Nitrite: NEGATIVE
Nitrite: NEGATIVE
Protein, ur: NEGATIVE mg/dL
Protein, ur: NEGATIVE mg/dL
Specific Gravity, Urine: 1.02 (ref 1.005–1.030)
Specific Gravity, Urine: 1.02 (ref 1.005–1.030)
Urobilinogen, UA: 0.2 mg/dL (ref 0.0–1.0)
Urobilinogen, UA: 0.2 mg/dL (ref 0.0–1.0)
pH: 5 (ref 5.0–8.0)
pH: 6.5 (ref 5.0–8.0)

## 2010-08-08 LAB — HEPARIN LEVEL (UNFRACTIONATED)
Heparin Unfractionated: 0.11 IU/mL — ABNORMAL LOW (ref 0.30–0.70)
Heparin Unfractionated: 0.17 IU/mL — ABNORMAL LOW (ref 0.30–0.70)
Heparin Unfractionated: 0.2 IU/mL — ABNORMAL LOW (ref 0.30–0.70)
Heparin Unfractionated: 0.21 IU/mL — ABNORMAL LOW (ref 0.30–0.70)
Heparin Unfractionated: 0.27 IU/mL — ABNORMAL LOW (ref 0.30–0.70)
Heparin Unfractionated: 0.3 IU/mL (ref 0.30–0.70)
Heparin Unfractionated: 0.39 IU/mL (ref 0.30–0.70)
Heparin Unfractionated: 0.4 IU/mL (ref 0.30–0.70)
Heparin Unfractionated: 0.51 IU/mL (ref 0.30–0.70)

## 2010-08-08 LAB — COMPREHENSIVE METABOLIC PANEL
ALT: 13 U/L (ref 0–35)
ALT: 24 U/L (ref 0–35)
AST: 17 U/L (ref 0–37)
AST: 37 U/L (ref 0–37)
Albumin: 3.5 g/dL (ref 3.5–5.2)
Albumin: 4.7 g/dL (ref 3.5–5.2)
Alkaline Phosphatase: 58 U/L (ref 39–117)
Alkaline Phosphatase: 39 U/L (ref 39–117)
BUN: 35 mg/dL — ABNORMAL HIGH (ref 6–23)
BUN: 21 mg/dL (ref 6–23)
CO2: 27 mEq/L (ref 19–32)
CO2: 28 mEq/L (ref 19–32)
Calcium: 9.4 mg/dL (ref 8.4–10.5)
Calcium: 9.4 mg/dL (ref 8.4–10.5)
Chloride: 98 mEq/L (ref 96–112)
Chloride: 105 mEq/L (ref 96–112)
Creatinine, Ser: 2.24 mg/dL — ABNORMAL HIGH (ref 0.4–1.2)
Creatinine, Ser: 1 mg/dL (ref 0.4–1.2)
GFR calc Af Amer: 27 mL/min — ABNORMAL LOW (ref >60)
GFR calc Af Amer: >60 mL/min (ref >60)
GFR calc non Af Amer: 22 mL/min — ABNORMAL LOW (ref >60)
GFR calc non Af Amer: 57 mL/min — ABNORMAL LOW (ref >60)
Glucose, Bld: 107 mg/dL — ABNORMAL HIGH (ref 70–99)
Glucose, Bld: 98 mg/dL (ref 70–99)
Potassium: 4.6 mEq/L (ref 3.5–5.1)
Potassium: 4.3 mEq/L (ref 3.5–5.1)
Sodium: 135 mEq/L (ref 135–145)
Sodium: 143 mEq/L (ref 135–145)
Total Bilirubin: 0.6 mg/dL (ref 0.3–1.2)
Total Bilirubin: 0.9 mg/dL (ref 0.3–1.2)
Total Protein: 8.1 g/dL (ref 6.0–8.3)
Total Protein: 8.1 g/dL (ref 6.0–8.3)

## 2010-08-08 LAB — PROTIME-INR
INR: 1.04 (ref 0.00–1.49)
INR: 1.21 (ref 0.00–1.49)
INR: 1.52 — ABNORMAL HIGH (ref 0.00–1.49)
INR: 1.54 — ABNORMAL HIGH (ref 0.00–1.49)
INR: 1.74 — ABNORMAL HIGH (ref 0.00–1.49)
INR: 1.76 — ABNORMAL HIGH (ref 0.00–1.49)
INR: 1.83 — ABNORMAL HIGH (ref 0.00–1.49)
INR: 2.32 — ABNORMAL HIGH (ref 0.00–1.49)
INR: 2.53 — ABNORMAL HIGH (ref 0.00–1.49)
INR: 2.85 — ABNORMAL HIGH (ref 0.00–1.49)
INR: 3.29 — ABNORMAL HIGH (ref 0.00–1.49)
INR: 2.67 — ABNORMAL HIGH (ref 0.00–1.49)
Prothrombin Time: 13.8 seconds (ref 11.6–15.2)
Prothrombin Time: 15.5 seconds — ABNORMAL HIGH (ref 11.6–15.2)
Prothrombin Time: 18.5 seconds — ABNORMAL HIGH (ref 11.6–15.2)
Prothrombin Time: 18.7 seconds — ABNORMAL HIGH (ref 11.6–15.2)
Prothrombin Time: 20.5 seconds — ABNORMAL HIGH (ref 11.6–15.2)
Prothrombin Time: 20.7 seconds — ABNORMAL HIGH (ref 11.6–15.2)
Prothrombin Time: 21.3 seconds — ABNORMAL HIGH (ref 11.6–15.2)
Prothrombin Time: 25.6 seconds — ABNORMAL HIGH (ref 11.6–15.2)
Prothrombin Time: 27.4 seconds — ABNORMAL HIGH (ref 11.6–15.2)
Prothrombin Time: 30 seconds — ABNORMAL HIGH (ref 11.6–15.2)
Prothrombin Time: 33.5 seconds — ABNORMAL HIGH (ref 11.6–15.2)
Prothrombin Time: 28.5 seconds — ABNORMAL HIGH (ref 11.6–15.2)

## 2010-08-08 LAB — CROSSMATCH
ABO/RH(D): A POS
Antibody Screen: NEGATIVE

## 2010-08-08 LAB — CULTURE, BLOOD (ROUTINE X 2)
Culture  Setup Time: 201109140214
Culture  Setup Time: 201109140214
Culture: NO GROWTH
Culture: NO GROWTH

## 2010-08-08 LAB — GLUCOSE, CAPILLARY: Glucose-Capillary: 248 mg/dL — ABNORMAL HIGH (ref 70–99)

## 2010-08-08 LAB — URINE MICROSCOPIC-ADD ON

## 2010-08-08 LAB — PREPARE RBC (CROSSMATCH)

## 2010-08-13 NOTE — Assessment & Plan Note (Signed)
Summary: follow   Vital Signs:  Patient profile:   60 year old female Menstrual status:  hysterectomy Height:      67.25 inches (170.82 cm) Weight:      209.50 pounds (95.23 kg) O2 Sat:      100 % on Room air Temp:     98.0 degrees F (36.67 degrees C) oral Pulse rate:   78 / minute BP sitting:   95 / 58  (right arm) Cuff size:   regular  Vitals Entered By: Josph Macho RMA (August 05, 2010 9:32 AM)  O2 Flow:  Room air  Serial Vital Signs/Assessments:  Time      Position  BP       Pulse  Resp  Temp     By                     108/64                         Danise Edge MD  CC: Follow-up visit/ CF Is Patient Diabetic? No   History of Present Illness: Patient is a 60 yo Caucasian female in today for f/u on acute bronchitis, the cough is 80% better, but she is still having some cough. No fevers/chills/malaise. She continues to have several loose stool daily and often has episodes of incontinence as a result. She denies any Fevers/chllls at this point. her last visit she was struggling with very bad cough, congestion, malaise was causing abdominal pain and she was treated with Cipro and Flagyl and as a result does feel significantly better she has since decided to proceed with surgical correction of her multiple hernias as her surgeon and she both agree that this should help the severity of her symptoms. She denies any bloody or tarry stool. She is not struggling with nausea vomiting or anorexia at this time. No chest pains, palpitations, shortness of breath, GI or GU complaints at this time  Current Medications (verified): 1)  Warfarin Sodium 5 Mg Tabs (Warfarin Sodium) .... 1/2 To 1 Tablet By Mouth Daily 2)  Acetaminophen 325 Mg Tabs (Acetaminophen) .... 2 Tablets Po As Needed For Pain 3)  Amiodarone Hcl 200 Mg Tabs (Amiodarone Hcl) .... 1/2 Tablet By Mouth Daily 4)  Aspir-Low 81 Mg Tbec (Aspirin) .... Once Daily 5)  Clonazepam 0.5 Mg Tabs (Clonazepam) .... 1/2 Tab By Mouth Two  Times A Day 6)  Fenofibrate 160 Mg Tabs (Fenofibrate) .Marland Kitchen.. 1 Tablet By Mouth Daily 7)  Lamictal 150 Mg Tabs (Lamotrigine) .... 1/2 Tablet By Mouth Daily 8)  Lisinopril-Hydrochlorothiazide 10-12.5 Mg Tabs (Lisinopril-Hydrochlorothiazide) .Marland Kitchen.. 1 Tablet By Mouth Daily 9)  Pravachol 40 Mg Tabs (Pravastatin Sodium) .Marland Kitchen.. 1 Tablet By Mouth At Bedtime 10)  Seroquel 200 Mg Tabs (Quetiapine Fumarate) .Marland Kitchen.. 1-2 Tablets By Mouth At Bedtime 11)  Hemocyte-F 324-1 Mg Tabs (Ferrous Fumarate-Folic Acid) .... Once Daily 12)  Senokot 8.6 Mg Tabs (Sennosides) .... Five  Tablet By Mouth  At Bedtime 13)  Amitiza 24 Mcg  Caps (Lubiprostone) .Marland Kitchen.. 1 Two Times A Day/take With Food and Water 14)  Cymbalta 30 Mg Cpep (Duloxetine Hcl) .... Take One Capsule Daily 15)  Cipro 500 Mg Tabs (Ciprofloxacin Hcl) .Marland Kitchen.. 1 Tab By Mouth Two Times A Day X 10 Day 16)  Flagyl 500 Mg Tabs (Metronidazole) .Marland Kitchen.. 1 Tab By Mouth Three Times A Day X 10 Days 17)  Tylenol With Codeine #3 300-30 Mg Tabs (Acetaminophen-Codeine) .Marland Kitchen.. 1 Tab  By Mouth Q 6 Hours As Needed Cough/pain  Allergies (verified): 1)  ! Sulfa  Past History:  Past medical history reviewed for relevance to current acute and chronic problems. Social history (including risk factors) reviewed for relevance to current acute and chronic problems.  Past Medical History: Reviewed history from 04/30/2010 and no changes required. 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 No apparent alopecia or balding. Mouth:  Oral mucosa and oropharynx without lesions or exudates.   Neck:  No deformities, masses, or tenderness noted. Lungs:  Normal respiratory effort, chest expands symmetrically. Lungs are clear to auscultation, no crackles or wheezes. Heart:  Normal rate and regular rhythm. S1 and S2 normal without gallop, rub or other extra sounds.grade2/6 systolic murmur.   Abdomen:  Bowel sounds positive,abdomen soft, obese, hernias noted Extremities:  No clubbing, cyanosis, edema, or deformity noted with normal full range of motion of all joints.   Cervical Nodes:  No lymphadenopathy noted Psych:  Cognition and judgment appear intact. Alert and cooperative with normal attention span and concentration. No apparent delusions, illusions, hallucinations   Impression & Recommendations:  Problem # 1:  DIARRHEA (ICD-787.91)  Her updated medication list for this problem includes:    Lactinex Chew (Lactobacillus) .Marland Kitchen... 2 tabs by mouth three times a day x 30 days  Orders: Fecal Occult Blood, 1 speciman (47829) T-Culture, Stool ([id number]) T-C diff by PCR ([id number]) Stool, WBC/Lactoferrin-[facility] (08657) T-Culture, Giardia / Cryptosporidium ([id number]) Start Benefiber and maintain adequate hydration, is already holding Senna   Problem # 2:  ABDOMINAL PAIN, GENERALIZED (ICD-789.07)  Her updated medication list for this problem includes:    Amitiza 24 Mcg Caps (Lubiprostone) .Marland Kitchen... 1 two times a day/take with food and water  Orders: Fecal Occult Blood, 1 speciman ([id number]) T-Culture, Stool ([id number]) T-C diff by PCR (40102) Stool, WBC/Lactoferrin-[facility] (72536) T-Culture, Giardia / Cryptosporidium ([id number]) Has surgery scheduled with Dr Lindie Spruce for hernia repair, will contact her surgeon about her Aspirin and will stop the Coumadin and fish oil as already instructed  Problem # 3:  THYROMEGALY (ICD-240.9) Mild,  unremarkable, no need for further work up  Problem # 4:  ANEMIA (ICD-285.9)  Her updated medication list for this problem includes:    Hemocyte-f 324-1 Mg Tabs (Ferrous fumarate-folic acid) ..... Once daily Patient will try and have her preop labs forwarded to [place]  Problem # 5:  MIXED HYPERLIPIDEMIA (ICD-272.2)  Her updated medication list for this problem includes:    Fenofibrate 160 Mg Tabs (Fenofibrate) .Marland Kitchen... 1 tablet by mouth daily    Pravachol 40 Mg Tabs (Pravastatin sodium) .Marland Kitchen... 1 tablet by mouth at bedtime Avoid trans fats and reassess after surgery.  Complete Medication List: 1)  Warfarin Sodium 5 Mg Tabs (Warfarin sodium) .... 1/2 to 1 tablet by mouth daily 2)  Acetaminophen 325 Mg Tabs (Acetaminophen) .... 2 tablets po as needed for pain 3)  Amiodarone Hcl 200 Mg Tabs (Amiodarone hcl) .... 1/2 tablet by mouth daily 4)  Aspir-low 81 Mg Tbec (Aspirin) .... Once daily 5)  Clonazepam  0.5 Mg Tabs (Clonazepam) .... 1/2 tab by mouth two times a day 6)  Fenofibrate 160 Mg Tabs (Fenofibrate) .Marland Kitchen.. 1 tablet by mouth daily 7)  Lamictal 150 Mg Tabs (Lamotrigine) .... 1/2 tablet by mouth daily 8)  Lisinopril-hydrochlorothiazide 10-12.5 Mg Tabs (Lisinopril-hydrochlorothiazide) .Marland Kitchen.. 1 tablet by mouth daily 9)  Pravachol 40 Mg Tabs (Pravastatin sodium) .Marland Kitchen.. 1 tablet by mouth at bedtime 10)  Seroquel 200 Mg Tabs (Quetiapine fumarate) .Marland Kitchen.. 1-2 tablets by mouth at bedtime 11)  Hemocyte-f 324-1 Mg Tabs (Ferrous fumarate-folic acid) .... Once daily 12)  Senokot 8.6 Mg Tabs (Sennosides) .... Five  tablet by mouth  at bedtime 13)  Amitiza 24 Mcg Caps (Lubiprostone) .Marland Kitchen.. 1 two times a day/take with food and water 14)  Cymbalta 30 Mg Cpep (Duloxetine hcl) .... Take one capsule daily 15)  Cipro 500 Mg Tabs (Ciprofloxacin hcl) .Marland Kitchen.. 1 tab by mouth two times a day x 10 day 16)  Flagyl 500 Mg Tabs (Metronidazole) .Marland Kitchen.. 1 tab by mouth three times a day x 10 days 17)  Tylenol With Codeine #3 300-30 Mg Tabs  (Acetaminophen-codeine) .Marland Kitchen.. 1 tab by mouth q 6 hours as needed cough/pain 18)  Lactinex Chew (Lactobacillus) .... 2 tabs by mouth three times a day x 30 days  Patient Instructions: 1)  Please schedule a follow-up appointment in 1 month.  2)  Start Benefiber 2 x daily 3)  Eat a yogurt daily as tolerated 4)  Take stool cultures to lab at [facility] at [place] across from [facility], needs address 5)  Hold Aspirin, Coumadin and Fish oil for 5 days prior to surgery Prescriptions: LACTINEX  CHEW (LACTOBACILLUS) 2 tabs by mouth three times a day x 30 days  #180 x 1   Entered and Authorized by:   Danise Edge MD   Signed by:   Danise Edge MD on 08/05/2010   Method used:   [facility] to        [facility] [phone][facility] (retail)       [address]       [place], [place]  [address]       Ph: [phone] or [phone]       Fax: [phone]   RxID:   [id number]    Orders Added: 1)  Fecal Occult Blood, 1 speciman [[id number]] 2)  T-Culture, Stool [[id number]] 3)  T-C diff by PCR [[id number]] 4)  Stool, WBC/Lactoferrin-[facility] [83630] 5)  T-Culture, Giardia / Cryptosporidium [[id number]] 6)  Est. Patient Level IV [25956]

## 2010-08-13 NOTE — Progress Notes (Signed)
  Phone Note From Other Clinic   Summary of Call: [facility] called stating that they received pts stool samples but no blood?  Initial call taken by: Josph Macho RMA,  August 08, 2010 3:46 PM  Follow-up for Phone Call        ? Can they not take it out of the samples they received and run it? If not run what they have please Follow-up by: Danise Edge MD,  August 09, 2010 9:02 AM  Additional Follow-up for Phone Call Additional follow up Details #1::        Loney Loh states if pt needs blood work she would need to return to get blood drawn. Additional Follow-up by: Josph Macho RMA,  August 09, 2010 10:22 AM

## 2010-08-14 ENCOUNTER — Inpatient Hospital Stay: Admission: AD | Admit: 2010-08-14 | Payer: Self-pay | Source: Ambulatory Visit | Admitting: General Surgery

## 2010-08-15 ENCOUNTER — Encounter (HOSPITAL_COMMUNITY)
Admission: RE | Admit: 2010-08-15 | Discharge: 2010-08-15 | Disposition: A | Discharge status: Home / Self Care | Payer: PRIVATE HEALTH INSURANCE | Source: Ambulatory Visit | Attending: General Surgery | Admitting: General Surgery

## 2010-08-15 LAB — PROTIME-INR
INR: 1.16 (ref 0.00–1.49)
Prothrombin Time: 15 seconds (ref 11.6–15.2)

## 2010-08-15 LAB — COMPREHENSIVE METABOLIC PANEL
ALT: 20 U/L (ref 0–35)
AST: 27 U/L (ref 0–37)
Albumin: 4.6 g/dL (ref 3.5–5.2)
Alkaline Phosphatase: 22 U/L — ABNORMAL LOW (ref 39–117)
BUN: 24 mg/dL — ABNORMAL HIGH (ref 6–23)
CO2: 29 mEq/L (ref 19–32)
Calcium: 9.6 mg/dL (ref 8.4–10.5)
Chloride: 104 mEq/L (ref 96–112)
Creatinine, Ser: 1.26 mg/dL — ABNORMAL HIGH (ref 0.4–1.2)
GFR calc Af Amer: 53 mL/min — ABNORMAL LOW (ref >60)
GFR calc non Af Amer: 43 mL/min — ABNORMAL LOW (ref >60)
Glucose, Bld: 80 mg/dL (ref 70–99)
Potassium: 4.5 mEq/L (ref 3.5–5.1)
Sodium: 141 mEq/L (ref 135–145)
Total Bilirubin: 0.6 mg/dL (ref 0.3–1.2)
Total Protein: 7.2 g/dL (ref 6.0–8.3)

## 2010-08-15 LAB — DIFFERENTIAL
Basophils Absolute: 0.1 10*3/uL (ref 0.0–0.1)
Basophils Relative: 2 % — ABNORMAL HIGH (ref 0–1)
Eosinophils Absolute: 0.1 10*3/uL (ref 0.0–0.7)
Eosinophils Relative: 2 % (ref 0–5)
Lymphocytes Relative: 37 % (ref 12–46)
Lymphs Abs: 1.8 10*3/uL (ref 0.7–4.0)
Monocytes Absolute: 0.7 10*3/uL (ref 0.1–1.0)
Monocytes Relative: 13 % — ABNORMAL HIGH (ref 3–12)
Neutro Abs: 2.2 10*3/uL (ref 1.7–7.7)
Neutrophils Relative %: 46 % (ref 43–77)

## 2010-08-15 LAB — URINALYSIS, ROUTINE W REFLEX MICROSCOPIC
Bilirubin Urine: NEGATIVE
Glucose, UA: NEGATIVE mg/dL
Hgb urine dipstick: NEGATIVE
Ketones, ur: NEGATIVE mg/dL
Nitrite: NEGATIVE
Protein, ur: NEGATIVE mg/dL
Specific Gravity, Urine: 1.021 (ref 1.005–1.030)
Urobilinogen, UA: 0.2 mg/dL (ref 0.0–1.0)
pH: 6 (ref 5.0–8.0)

## 2010-08-15 LAB — CBC
HCT: 34.9 % — ABNORMAL LOW (ref 36.0–46.0)
Hemoglobin: 11.3 g/dL — ABNORMAL LOW (ref 12.0–15.0)
MCH: 29.4 pg (ref 26.0–34.0)
MCHC: 32.4 g/dL (ref 30.0–36.0)
MCV: 90.6 fL (ref 78.0–100.0)
Platelets: 269 10*3/uL (ref 150–400)
RBC: 3.85 MIL/uL — ABNORMAL LOW (ref 3.87–5.11)
RDW: 13.8 % (ref 11.5–15.5)
WBC: 4.9 10*3/uL (ref 4.0–10.5)

## 2010-08-15 LAB — SURGICAL PCR SCREEN
MRSA, PCR: NEGATIVE
Staphylococcus aureus: NEGATIVE

## 2010-08-15 LAB — APTT: aPTT: 36 seconds (ref 24–37)

## 2010-08-16 ENCOUNTER — Inpatient Hospital Stay (HOSPITAL_COMMUNITY)
Admission: RE | Admit: 2010-08-16 | Discharge: 2010-08-19 | DRG: 354 | Disposition: A | Discharge status: Home / Self Care | Payer: PRIVATE HEALTH INSURANCE | Source: Ambulatory Visit | Attending: General Surgery | Admitting: General Surgery

## 2010-08-16 DIAGNOSIS — F329 Major depressive disorder, single episode, unspecified: Secondary | ICD-10-CM | POA: Diagnosis present

## 2010-08-16 DIAGNOSIS — K430 Incisional hernia with obstruction, without gangrene: Principal | ICD-10-CM | POA: Diagnosis present

## 2010-08-16 DIAGNOSIS — F3289 Other specified depressive episodes: Secondary | ICD-10-CM | POA: Diagnosis present

## 2010-08-16 DIAGNOSIS — Z7901 Long term (current) use of anticoagulants: Secondary | ICD-10-CM

## 2010-08-16 DIAGNOSIS — K219 Gastro-esophageal reflux disease without esophagitis: Secondary | ICD-10-CM | POA: Diagnosis present

## 2010-08-16 DIAGNOSIS — Z954 Presence of other heart-valve replacement: Secondary | ICD-10-CM

## 2010-08-16 DIAGNOSIS — J45909 Unspecified asthma, uncomplicated: Secondary | ICD-10-CM | POA: Diagnosis present

## 2010-08-16 DIAGNOSIS — Z87891 Personal history of nicotine dependence: Secondary | ICD-10-CM

## 2010-08-16 DIAGNOSIS — I5022 Chronic systolic (congestive) heart failure: Secondary | ICD-10-CM | POA: Diagnosis present

## 2010-08-16 DIAGNOSIS — I10 Essential (primary) hypertension: Secondary | ICD-10-CM | POA: Diagnosis present

## 2010-08-16 HISTORY — PX: HERNIA REPAIR: SHX51

## 2010-08-16 LAB — TYPE AND SCREEN
ABO/RH(D): A POS
Antibody Screen: NEGATIVE

## 2010-08-17 LAB — PROTIME-INR
INR: 1.06 (ref 0.00–1.49)
Prothrombin Time: 14 seconds (ref 11.6–15.2)

## 2010-08-17 LAB — URINE CULTURE
Colony Count: 30000
Culture  Setup Time: 201203221614

## 2010-08-18 LAB — PROTIME-INR
INR: 1.12 (ref 0.00–1.49)
Prothrombin Time: 14.6 seconds (ref 11.6–15.2)

## 2010-08-19 LAB — PROTIME-INR
INR: 1.22 (ref 0.00–1.49)
Prothrombin Time: 15.6 seconds — ABNORMAL HIGH (ref 11.6–15.2)

## 2010-08-19 LAB — CBC
HCT: 29.6 % — ABNORMAL LOW (ref 36.0–46.0)
Hemoglobin: 9.6 g/dL — ABNORMAL LOW (ref 12.0–15.0)
MCH: 30.1 pg (ref 26.0–34.0)
MCHC: 32.4 g/dL (ref 30.0–36.0)
MCV: 92.8 fL (ref 78.0–100.0)
Platelets: 224 10*3/uL (ref 150–400)
RBC: 3.19 MIL/uL — ABNORMAL LOW (ref 3.87–5.11)
RDW: 13.4 % (ref 11.5–15.5)
WBC: 7.9 10*3/uL (ref 4.0–10.5)

## 2010-08-28 NOTE — Op Note (Signed)
[name], [name]                 ACCOUNT NO.:  [id number]  MEDICAL RECORD NO.:  [id number]           PATIENT TYPE:  I  LOCATION:  5118                         FACILITY:  [facility]  PHYSICIAN:  Cherylynn Ridges, M.D.    DATE OF BIRTH:  [birth year]  DATE OF PROCEDURE:  08/16/2010 DATE OF DISCHARGE:                              OPERATIVE REPORT   PREOPERATIVE DIAGNOSIS:  Incisional ventral hernia.  POSTOPERATIVE DIAGNOSIS:  Incisional ventral hernia.  PROCEDURE:  Repair of incisional ventral hernia with Physiomesh.  SURGEON:  Cherylynn Ridges, MD  ANESTHESIA:  General endotracheal.  ESTIMATED BLOOD LOSS:  Less than 50 mL.  COMPLICATIONS:  None.  CONDITION:  Stable.  FINDINGS:  The patient had ultimately an 8 x 10 cm midline ventral hernia defect.  There was some incarcerated omentum in the defect and bowel near the surface.  INDICATIONS FOR OPERATION:  The patient is a 60 year old female with a history of a heart valve replacement on anticoagulation.  We had a known ventral hernia when she last had appendicitis treated, however, because of infected environment and the patient was asymptomatic, this was not treated previously, now she has become symptomatic with pain with protrusion and incarceration and comes in for repair.  OPERATION:  The patient was taken to the operating room and placed on the table in the supine position.  After an adequate time-out was performed identifying the patient and the procedure to be performed, she was prepped and draped in usual sterile manner using ChloraPrep and isolated from the surrounding areas.  We made our initial midline incision through the lower midline incision from her previous lower abdominal surgery.  Using a #10 blade, took it down into the subcutaneous tissue.  In the lower portion of the incision, there were bowel loops protruding up into the subcutaneous tissue and care was taken to dissect this away from the surrounding  scar tissue and fascia.  Kocher clamps, Metzenbaum scissors, electrocautery were used to isolate out the fascia eventually and a small bowel in the sac which mostly within the lower portion of the wound.  There were loops of incarcerated bowel in the lower portion of the abdomen which were reduced upon on further opening of the fascia.  We opened up the midline from the lower portion and the sac towards the upper part of the incision and once we had gotten freely into the peritoneal cavity, we were able to dissect out the omentum and small bowel which was attached to the anterior abdominal wall using Metzenbaum scissors.  We circumferentially palpated the internal portion of the fascia.  I could see where it ended and where we need to attach our mesh.  The ultimate opening was about 8 x 10 cm in size and therefore used a 10 x 15 piece of mesh which we cut to the appropriate size for an inlay Physiomesh repair.  Horizontal stitches of #1 Novafil were used to attach the Physiomesh to the internal portion of the anterior abdominal wall.  This was done circumferentially after dissecting out of flap circumferentially in order to adequately visualize the fascia.  Total of about 12 stitches were used to tack the fascia to the anterior abdominal wall.  Once this was done, we closed the scar tissue on top of the mesh.  We actually soaked the mesh in antibiotic solution prior to implanting it and we used that to irrigate into the subcutaneous tissue.  Once we closed on top of the mesh using figure-of-eight stitches of #1 Novafil and the mesh was in place, we closed the subcu using running 3-0 Vicryl suture.  We injected 0.5% Marcaine without epi into the wound and was subsequently closed the skin using a running subcuticular stitch of 3-0 Monocryl.  Dermabond and Steri-Strips were used to complete the wound.  All counts were correct.     Cherylynn Ridges, M.D.     JOW/MEDQ  D:  08/16/2010   T:  08/17/2010  Job:  [id number]  Electronically Signed by Jimmye Norman M.D. on 08/28/2010 05:22:51 PM

## 2010-08-28 NOTE — Discharge Summary (Signed)
  NAMERASHANDA, [name]                 ACCOUNT NO.:  [id number]  MEDICAL RECORD NO.:  [id number]           PATIENT TYPE:  I  LOCATION:  5118                         FACILITY:  [facility]  PHYSICIAN:  Cherylynn Ridges, M.D.    DATE OF BIRTH:  Oct 22, 1950  DATE OF ADMISSION:  08/16/2010 DATE OF DISCHARGE:  08/19/2010                              DISCHARGE SUMMARY   DISCHARGE DIAGNOSIS:  Incisional ventral hernia.  PRINCIPAL PROCEDURE:  Open ventral hernia repair with mesh by Dr. Lindie Spruce.  ADDITIONAL DIAGNOSES: 1. Chronic diastolic heart failure. 2. Hypertension. 3. History of aortic valve replacement with a #28 [employer] valve in     December 2009. 4. Asthma. 5. Chronic depression.  DISCHARGE MEDICATIONS: 1. Vicodin 1-2 tablets every 4 hours p.r.n. for pain. 2. Lactinex. 3. Coumadin or warfarin 5 mg p.o. daily. 4. Lovenox 90 mg subcu q.12 h. 5. Folic acid. 6. Amitiza 24 mg p.o. b.i.d. 7. Cymbalta 30 mg p.o. q.a.m. 8. Lisinopril and hydrochlorothiazide combination 10/12.5 one tablet     daily. 9. Lamictal 150 mg half a tablet once a day. 10.Amiodarone 100 mg p.o. daily. 11.Pravachol 40 mg tablet daily. 12.Clonazepam 0.25 mg p.o. b.i.d. 13.Seroquel 300 mg p.o. at bedtime. 14.Singulair 1 tablet p.o. daily.  She is allergic to SULFA medication.  Her diet on discharge is unrestricted regular diet.  CONDITION:  Stable.  Wound looks good, will need no home health care.  No lifting for at least 5 weeks.  She can drive starting in a week, go up and down stairs, she can shower and pat her wounds dry.  HISTORY AND BRIEF SUMMARY OF HOSPITAL COURSE:  The patient was admitted the day of surgery August 16, 2010, after Coumadin had been stopped for approximately 5 days prior to surgery.  She was on supplemental Lovenox therapy and her last shot was the day prior to surgery.  She comes in for open ventral hernia repair, which went unremarkably with [place] Physi[facility]h repair.  The wound at the  time of discharge looks well.  She is passing flatus.  She has no fever.  Blood pressure is fine.  She had a little bit of bloody drainage on her binder, but no active bleeding from her wound.  She has been on Lovenox therapy postoperatively.  She is eating well, going to the restroom, voiding well.  She is to return to see me a week from tomorrow, which will be August 27, 2010, and she should call me if she has any problems before then.  She is to follow up to see Dr. Katrinka Blazing in his office for INR check on Friday at which time adjustments will be made to her Lovenox and her Coumadin therapy.     Cherylynn Ridges, M.D.     JOW/MEDQ  D:  08/19/2010  T:  08/20/2010  Job:  [id number]  Electronically Signed by Jimmye Norman M.D. on 08/28/2010 05:22:45 PM

## 2010-09-08 ENCOUNTER — Other Ambulatory Visit: Payer: Self-pay | Admitting: Family Medicine

## 2010-09-09 ENCOUNTER — Encounter: Payer: Self-pay | Admitting: Family Medicine

## 2010-09-09 ENCOUNTER — Ambulatory Visit (INDEPENDENT_AMBULATORY_CARE_PROVIDER_SITE_OTHER): Payer: PRIVATE HEALTH INSURANCE | Admitting: Family Medicine

## 2010-09-09 DIAGNOSIS — F319 Bipolar disorder, unspecified: Secondary | ICD-10-CM

## 2010-09-09 DIAGNOSIS — J209 Acute bronchitis, unspecified: Secondary | ICD-10-CM

## 2010-09-09 DIAGNOSIS — M25569 Pain in unspecified knee: Secondary | ICD-10-CM

## 2010-09-09 DIAGNOSIS — E663 Overweight: Secondary | ICD-10-CM

## 2010-09-09 DIAGNOSIS — D649 Anemia, unspecified: Secondary | ICD-10-CM

## 2010-09-09 DIAGNOSIS — K5900 Constipation, unspecified: Secondary | ICD-10-CM

## 2010-09-09 DIAGNOSIS — R197 Diarrhea, unspecified: Secondary | ICD-10-CM

## 2010-09-09 DIAGNOSIS — R1084 Generalized abdominal pain: Secondary | ICD-10-CM

## 2010-09-09 LAB — BASIC METABOLIC PANEL
BUN: 14 mg/dL (ref 6–23)
CO2: 28 mEq/L (ref 19–32)
Calcium: 8.9 mg/dL (ref 8.4–10.5)
Chloride: 105 mEq/L (ref 96–112)
Creatinine, Ser: 0.71 mg/dL (ref 0.4–1.2)
GFR calc Af Amer: >60 mL/min (ref >60)
GFR calc non Af Amer: >60 mL/min (ref >60)
Glucose, Bld: 110 mg/dL — ABNORMAL HIGH (ref 70–99)
Potassium: 4.3 mEq/L (ref 3.5–5.1)
Sodium: 139 mEq/L (ref 135–145)

## 2010-09-09 LAB — PROTIME-INR
INR: 1.4 (ref 0.00–1.49)
INR: 1.6 — ABNORMAL HIGH (ref 0.00–1.49)
INR: 1.7 — ABNORMAL HIGH (ref 0.00–1.49)
Prothrombin Time: 17.2 seconds — ABNORMAL HIGH (ref 11.6–15.2)
Prothrombin Time: 19.7 seconds — ABNORMAL HIGH (ref 11.6–15.2)
Prothrombin Time: 20.9 seconds — ABNORMAL HIGH (ref 11.6–15.2)

## 2010-09-09 LAB — GLUCOSE, CAPILLARY
Glucose-Capillary: 114 mg/dL — ABNORMAL HIGH (ref 70–99)
Glucose-Capillary: 116 mg/dL — ABNORMAL HIGH (ref 70–99)
Glucose-Capillary: 124 mg/dL — ABNORMAL HIGH (ref 70–99)
Glucose-Capillary: 132 mg/dL — ABNORMAL HIGH (ref 70–99)
Glucose-Capillary: 153 mg/dL — ABNORMAL HIGH (ref 70–99)
Glucose-Capillary: 78 mg/dL (ref 70–99)
Glucose-Capillary: 93 mg/dL (ref 70–99)
Glucose-Capillary: 98 mg/dL (ref 70–99)

## 2010-09-09 LAB — URINALYSIS, ROUTINE W REFLEX MICROSCOPIC
Bilirubin Urine: NEGATIVE
Glucose, UA: NEGATIVE mg/dL
Hgb urine dipstick: NEGATIVE
Ketones, ur: NEGATIVE mg/dL
Nitrite: NEGATIVE
Protein, ur: NEGATIVE mg/dL
Specific Gravity, Urine: 1.017 (ref 1.005–1.030)
Urobilinogen, UA: 1 mg/dL (ref 0.0–1.0)
pH: 8 (ref 5.0–8.0)

## 2010-09-10 LAB — CBC
HCT: 35.1 % — ABNORMAL LOW (ref 36.0–46.0)
Hemoglobin: 12 g/dL (ref 12.0–15.0)
MCHC: 34.3 g/dL (ref 30.0–36.0)
MCV: 86 fL (ref 78.0–100.0)
Platelets: 349 10*3/uL (ref 150–400)
RBC: 4.08 MIL/uL (ref 3.87–5.11)
RDW: 14.1 % (ref 11.5–15.5)
WBC: 7.7 10*3/uL (ref 4.0–10.5)

## 2010-09-10 LAB — PROTIME-INR
INR: 2.6 — ABNORMAL HIGH (ref 0.00–1.49)
Prothrombin Time: 29.2 seconds — ABNORMAL HIGH (ref 11.6–15.2)

## 2010-09-10 LAB — APTT: aPTT: 51 seconds — ABNORMAL HIGH (ref 24–37)

## 2010-09-12 ENCOUNTER — Encounter: Payer: Self-pay | Admitting: Family Medicine

## 2010-09-12 NOTE — Assessment & Plan Note (Signed)
Patient stable on current meds and doing well.

## 2010-09-12 NOTE — Assessment & Plan Note (Signed)
Patient reports when they did her hernia repair they found a "kink" in her bowels, since her surgery her bowels are moving much better and without pain.

## 2010-09-12 NOTE — Assessment & Plan Note (Signed)
Patient is notably better since her hernia repair but she did develop a hematoma at the incision site which is still mildly tender but slowly improving. Is not warm, red or enlarging. She is encouraged to continue to monitor and may try applying ice and Aspercreme prn for discomfort. Report to surgeon if worsens

## 2010-09-12 NOTE — Assessment & Plan Note (Signed)
Resolved patient w/o complaints. No cough, congestion, fevers

## 2010-09-12 NOTE — Assessment & Plan Note (Signed)
Patient is a couple weeks post op s/p hernia repair, will repeat cbc at next visit

## 2010-09-12 NOTE — Assessment & Plan Note (Signed)
Patient is moving her bowels much better s/p her hernia repair but she does occasionally experience some incontinence when her are loose. She is encouraged to add Benefiber up to 3 x daily and a probiotic daily

## 2010-09-12 NOTE — Assessment & Plan Note (Signed)
Has managed to loose a couple of pounds since her last visit. She is encouraged to increase her activity level further as tolerated and eat a heart healthy diet with minimal saturated fats, no trans fats and minimal simple carbs

## 2010-09-12 NOTE — Assessment & Plan Note (Signed)
Pain is persistent but patient is able to mange it. Encouraged as much activity as able. Keep weight down if possible

## 2010-09-12 NOTE — Progress Notes (Signed)
[name] [id number] Jun 08, 1950 09/12/2010      Progress Note-Follow Up  Subjective  Chief Complaint  Chief Complaint  Patient presents with  . Follow-up    on hernia repair    HPI  Physical Exam  Constitutional: She is oriented to person, place, and time and well-developed, well-nourished, and in no distress. No distress.  HENT:  Head: Normocephalic and atraumatic.  Eyes: Conjunctivae are normal.  Neck: Neck supple. No thyromegaly present.  Cardiovascular: Normal rate and regular rhythm.   Murmur heard.      Systolic click  Pulmonary/Chest: Effort normal and breath sounds normal. She has no wheezes. She has no rales.  Abdominal: Soft. Bowel sounds are normal. She exhibits mass. She exhibits no distension. There is tenderness. There is no rebound and no guarding.       Midline incision healing well without erythema or fluctuance, there is a small firm circular lesion below scar about mid incision  Musculoskeletal: She exhibits no edema and no tenderness.  Lymphadenopathy:    She has no cervical adenopathy.  Neurological: She is alert and oriented to person, place, and time.  Skin: Skin is warm and dry. No rash noted. She is not diaphoretic.  Psychiatric: Memory, affect and judgment normal.    Past Medical History  Diagnosis Date  . Anemia   . Anxiety   . Depression   . Diverticulosis   . Elevated cholesterol   . Obesity   . THYROMEGALY 07/22/2010  . Overweight 03/11/2010  . Mixed hyperlipidemia 03/11/2010  . MEASLES, HX OF 03/11/2010  . KNEE PAIN, RIGHT 05/28/2010  . HYPERTRIGLYCERIDEMIA 03/11/2010  . HEART MURMUR, HX OF 03/11/2010  . FIBROIDS, UTERUS 03/11/2010  . Diarrhea 08/05/2010  . CONSTIPATION 03/11/2010  . CHICKENPOX, HX OF 03/11/2010  . BIPOLAR AFFECTIVE DISORDER 03/11/2010  . AORTIC VALVE REPLACEMENT, HX OF 03/11/2010  . ANEMIA 05/28/2010  . Acute bronchitis 07/22/2010  . Abdominal pain, generalized 03/19/2010    Past Surgical History  Procedure  Date  . Appendectomy     Required revision for EColi infection, required recurrent packing  . Bowel obstruction     Requiring adhesions to be removed  . Abdominal hysterectomy     sb/l spo, total  . Aortic valve replacement   . Open heart with a cardiac aneurysm repair   . Cholecystectomy   . Tubal ligation   . Varicose vein surgery b/l legs   . Childhood exploratory surgery of heart   . Hernia repair 08-16-10    Family History  Problem Relation Age of Onset  . Scoliosis Mother   . Arthritis Mother     Rheumatoid  . Aneurysm Mother     heart  . Alcohol abuse Father   . Depression Sister   . Depression Brother   . Alcohol abuse Brother   . Cancer Maternal Grandmother     colon  . Diabetes Maternal Grandfather   . Heart disease Maternal Grandfather   . Alcohol abuse Maternal Grandfather   . Depression Paternal Grandmother   . Diabetes Paternal Grandmother   . Depression Paternal Grandfather   . Diabetes Paternal Grandfather   . Depression Sister   . Alcohol abuse Brother   . Depression Brother   . Heart disease Brother     History   Social History  . Marital Status: Married    Spouse Name: N/A    Number of Children: N/A  . Years of Education: N/A   Occupational History  . Not  on file.   Social History Main Topics  . Smoking status: Former Smoker    Quit date: 05/26/1990  . Smokeless tobacco: Never Used  . Alcohol Use: 0.0 oz/week    0 drink(s) per week     very serious occasions  . Drug Use: No  . Sexually Active: No   Other Topics Concern  . Not on file   Social History Narrative  . No narrative on file    Current Outpatient Prescriptions on File Prior to Visit  Medication Sig Dispense Refill  . acetaminophen (TYLENOL) 325 MG tablet Take 650 mg by mouth every 6 (six) hours as needed.        . Acetaminophen-Codeine (TYLENOL/CODEINE #3) 300-30 MG per tablet Take 1 tablet by mouth every 6 (six) hours as needed. Cough/ pain       . amiodarone  (PACERONE) 200 MG tablet Take 200 mg by mouth daily. 1/2 tab       . aspirin 81 MG tablet Take 81 mg by mouth daily.        . clonazePAM (KLONOPIN) 0.5 MG tablet Take 0.5 mg by mouth 2 (two) times daily. 1/2 tab       . DULoxetine (CYMBALTA) 30 MG capsule Take 30 mg by mouth daily.        . fenofibrate 160 MG tablet Take 160 mg by mouth daily.        . Ferrous Fumarate-Folic Acid 324-1 MG TABS Take by mouth daily.        Marland Kitchen lactobacillus acidophilus & bulgar (LACTINEX) chewable tablet Chew 1 tablet by mouth 3 (three) times daily with meals. 2 tabs po tid X 30 days       . lamoTRIgine (LAMICTAL) 150 MG tablet Take 150 mg by mouth daily. 1/2 tab       . lisinopril-hydrochlorothiazide (PRINZIDE,ZESTORETIC) 10-12.5 MG per tablet Take 1 tablet by mouth daily.        . pravastatin (PRAVACHOL) 40 MG tablet Take 40 mg by mouth at bedtime.        Marland Kitchen QUEtiapine (SEROQUEL) 200 MG tablet Take 200 mg by mouth at bedtime. 1-2 tab       . warfarin (COUMADIN) 5 MG tablet Take 5 mg by mouth daily. 1/2 to 1 tab       . ciprofloxacin (CIPRO) 500 MG tablet Take 500 mg by mouth 2 (two) times daily. X 10 days       . Hyoscyamine Sulfate 0.125 MG TBDP Place under the tongue every 4 (four) hours as needed. Every 2-4 hours for abdominal pain      . lubiprostone (AMITIZA) 24 MCG capsule Take 24 mcg by mouth 2 (two) times daily with meals.        . metroNIDAZOLE (FLAGYL) 500 MG tablet Take 500 mg by mouth 3 (three) times daily. X 10 days       . senna (SENOKOT) 8.6 MG tablet Take 1 tablet by mouth at bedtime. Five tablets       . SINGULAIR 10 MG tablet TAKE 1 TABLET EVERY DAY  30 tablet  1    Allergies  Allergen Reactions  . Sulfonamide Derivatives     Review of Systems  Review of Systems  Constitutional: Negative for fever and malaise/fatigue.  HENT: Negative for congestion.   Eyes: Negative for discharge.  Respiratory: Negative for shortness of breath.   Cardiovascular: Negative for chest pain, palpitations and  leg swelling.  Gastrointestinal: Positive for abdominal pain. Negative for nausea,  vomiting, diarrhea, constipation, blood in stool and melena.  Genitourinary: Negative for dysuria.  Musculoskeletal: Negative for falls.  Skin: Negative for rash.  Neurological: Negative for loss of consciousness and headaches.  Endo/Heme/Allergies: Negative for polydipsia.  Psychiatric/Behavioral: Negative for depression and suicidal ideas. The patient is not nervous/anxious and does not have insomnia.     Objective  BP 93/58  Pulse 74  Temp(Src) 98 F (36.7 C) (Oral)  Ht 5' 7.25" (1.708 m)  Wt 207 lb (93.895 kg)  BMI 32.18 kg/m2  SpO2 98%  Physical Exam  The patient is a 60 year old Caucasian female in today for routine followup. She underwent a hernia repair with Dr. Lindie Spruce at home health on 08/16/2010 and she is recovering well. She does note she developed a hematoma at the incision site after the surgery which was initially very painful but is slowly improving. She denies any warmth or redness at the site. Otherwise she says she stepped well. Prior to surgery she was having a very difficult and painful bowel movements and now she moves her bowels regularly without pain. Of note her stools are somewhat loose and she occasionally has some incontinence issues with urgency. She denies any blood in her stool any excessive watery stool. She denies fevers, chills, GU complaints, chest pain, palpitations, shortness of breath and she reports her respiratory symptoms and cough have resolved. Overall she feels she is doing well and is pleased with her surgical results. She is accompanied by her mother who confirms.  Lab Results  Component Value Date   TSH 2.171 03/13/2010   Lab Results  Component Value Date   WBC 7.9 08/19/2010   HGB 9.6* 08/19/2010   HCT 29.6* 08/19/2010   MCV 92.8 08/19/2010   PLT 224 08/19/2010   Lab Results  Component Value Date   CREATININE 1.26* 08/15/2010   BUN 24* 08/15/2010   NA 141  08/15/2010   K 4.5 08/15/2010   CL 104 08/15/2010   CO2 29 08/15/2010   Lab Results  Component Value Date   ALT 20 08/15/2010   AST 27 08/15/2010   ALKPHOS 22* 08/15/2010   BILITOT 0.6 08/15/2010   No results found for this basename: CHOL   No results found for this basename: HDL   No results found for this basename: LDLCALC   No results found for this basename: TRIG   No results found for this basename: CHOLHDL     Assessment & Plan  OVERWEIGHT Has managed to loose a couple of pounds since her last visit. She is encouraged to increase her activity level further as tolerated and eat a heart healthy diet with minimal saturated fats, no trans fats and minimal simple carbs  KNEE PAIN, RIGHT Pain is persistent but patient is able to mange it. Encouraged as much activity as able. Keep weight down if possible  DIARRHEA Patient is moving her bowels much better s/p her hernia repair but she does occasionally experience some incontinence when her are loose. She is encouraged to add Benefiber up to 3 x daily and a probiotic daily  CONSTIPATION Patient reports when they did her hernia repair they found a "kink" in her bowels, since her surgery her bowels are moving much better and without pain.  BIPOLAR AFFECTIVE DISORDER Patient stable on current meds and doing well.  ANEMIA Patient is a couple weeks post op s/p hernia repair, will repeat cbc at next visit  ACUTE BRONCHITIS Resolved patient w/o complaints. No cough, congestion, fevers  ABDOMINAL PAIN,  GENERALIZED Patient is notably better since her hernia repair but she did develop a hematoma at the incision site which is still mildly tender but slowly improving. Is not warm, red or enlarging. She is encouraged to continue to monitor and may try applying ice and Aspercreme prn for discomfort. Report to surgeon if worsens

## 2010-10-01 ENCOUNTER — Encounter (INDEPENDENT_AMBULATORY_CARE_PROVIDER_SITE_OTHER): Payer: Self-pay | Admitting: General Surgery

## 2010-10-08 NOTE — Procedures (Signed)
DUPLEX DEEP VENOUS EXAM - LOWER EXTREMITY   INDICATION:  Followup right lesser saphenous vein laser ablation.   HISTORY:  Edema:  No.  Trauma/Surgery:  Right lesser saphenous vein laser ablation on  03/06/2008.  Pain:  No.  PE:  No.  Previous DVT:  No.  Anticoagulants:  Other:   DUPLEX EXAM:                CFV   SFV   PopV  PTV    GSV                R  L  R  L  R  L  R   L  R  L  Thrombosis    o  o  o     o     o      o  Spontaneous   +  +  +     +     +      +  Phasic        +  +  +     +     +      +  Augmentation  +  +  +     +     +      +  Compressible  +  +  +     +     +      +  Competent     +           +            +   Legend:  + - yes  o - no  p - partial  D - decreased    IMPRESSION:  1. No evidence of deep vein thrombosis noted in the right lower      extremity.  2. Totally occluded right lesser saphenous vein extending from the mid      to distal calf to 2 cm from the saphenopopliteal junction.  3. Totally occluded varicose veins of the mid calf area noted.       _____________________________  Quita Skye Hart Rochester, M.D.   CH/MEDQ  D:  03/20/2008  T:  03/20/2008  Job:  [id number]

## 2010-10-08 NOTE — Consult Note (Signed)
NEW PATIENT CONSULTATION   [name], [name]  DOB:  1950/06/08                                        May 02, 2008  CHART #:  [id number]   REASON FOR CONSULTATION:  Ascending aortic aneurysm with moderately  severe aortic stenosis and aortic insufficiency with bicuspid aortic  valve.   CLINICAL HISTORY:  I was asked by Dr. Katrinka Blazing to evaluate the patient for  surgical treatment of her aortic valve disease and ascending aortic  aneurysm.  She is a 60 year old woman with a history of bicuspid aortic  valve with a murmur documented as a child.  She has been followed  periodically with echocardiogram.  She said she has done well until this  past summer when she noted some progression in her shortness of breath  on exertion during a trip to [facility].  She developed  exertional shortness of breath as well as tightness in her chest and  presyncope.  Since then, she has noted decreased energy level and having  to take frequent naps.  She has also noticed multiple episodes of  dizziness and has had exertional shortness of breath.  She had a  echocardiogram performed on April 07, 2008, which showed ejection  fraction 50%-55% with normal left ventricular dimension.  The right  ventricle had normal size and function.  There is trace mitral  regurgitation.  It was felt to be moderate calcification of the aortic  valve with decreased excursion and moderate-to-severe aortic  insufficiency and mild-to-moderate aortic stenosis.  The aortic valve  area was measured at 1.42 to 1.57.  Peak gradient was 37 with a mean  gradient of 22.  She subsequently underwent cardiac catheterization on  April 24, 2008.  This showed a PA pressure of 34/13 with a right  ventricular pressure of 36/13 and mean right atrial pressure of 10.  Wedge pressure was 13 with a cardiac output of 6.1.  Aortography showed  markedly dilated ascending aorta, which started from root level and  had  estimated diameter of about 5 cm.  There is 2+ aortic insufficiency.  The distal end of the aneurysm could not clearly be defined, but appear  to extend up to the takeoff of the innominate artery.  Coronary  angiography showed a widely patent LAD, left circumflex, and right  coronary arteries.  The ostium of left main was never sufficiently  visualized.  There is no catheter dampening noted, but there is some  question of whether it might be some ostial narrowing.  Left ventricular  function was not determined.   REVIEW OF SYSTEMS:  Her review of systems is as follows.  General:  She  denies any fever or chills.  She has had no recent weight changes.  She  does report exertional fatigue and decreased energy level.  Eyes:  Negative.  ENT:  Negative.  Endocrine:  She denies diabetes and  hypothyroidism.  Cardiovascular:  She does report some chest pressure  and frequent palpitations.  She has exertional dyspnea.  She denies PND  and orthopnea.  Respiratory:  She does report history of asthma and has  had some congestive cough without sputum production.  GI:  She denies  nausea or vomiting.  She denies melena and bright red blood per rectum.  She has had problems of chronic constipation and ill-defined lower  abdominal pain.  She has a history of valve structure in the past  secondary to adhesions.  GU:  She denies dysuria and hematuria.  Vascular:  She denies claudication.  She did have a history of varicose  vein disease and underwent bilateral varicose vein procedures by Dr. Jerilee Field in the office recently.  Neurological:  She does report a  history of dizziness.  She has had no focal weakness or numbness.  She  denies syncope.  She has never had TIA or stroke.  Musculoskeletal:  She  does report a history of muscle pain.  Psychiatric:  She has a history  of depression and anxiety.  Hematological:  She denies any history of  bleeding disorders or easy bleeding.   ALLERGIES:   Sulfa.   MEDICATIONS:  Singulair 10 mg daily, Ambien 12.5 mg nightly, Trilipix  134 mg daily, Nasonex p.r.n., and Cymbalta 60 mg daily.   PAST MEDICAL HISTORY:  Significant for history of aortic valve disease  as mentioned above with bicuspid aortic valve.  She reports previous  cardiac catheterization in 2007 with no significant coronary artery  disease.  There is some mention in the notes of her having a less than  60% LAD and left circumflex lesions.  She has a history varicose vein  disease treated by Dr. Hart Rochester as noted above.  She has a history of  asthma.  She has a history of hypercholesterolemia.  She has a history  of diverticulitis.  She has a history of bowel obstruction, status post  laparotomy in the past.  She has a history of hysterectomy and bilateral  salpingo-oophorectomy.  She is status post cholecystectomy.   FAMILY HISTORY:  Father died in 76 with kidney problems.  Mother has  history of COPD and coronary artery disease.   SOCIAL HISTORY:  She has a distant history of small amount of smoking,  but quit over 22 years ago.  She is married and lives with her husband.  She works at [employer].  She has one adult child.   PHYSICAL EXAMINATION:  VITAL SIGNS:  Her blood pressure is 137/79, pulse  78 and regular, and respiratory rate is 18, unlabored.  Oxygen  saturation on room air is 98%.  GENERAL:  She is a well-developed white female in no distress.  HEENT:  Normocephalic and atraumatic.  Pupils are equal and reactive to  light and accommodation.  Extraocular muscles are intact.  Throat is  clear.  NECK:  Normal carotid pulses bilaterally.  There are no bruits.  There  is no adenopathy or thyromegaly.  CARDIAC:  Irregular rhythm.  It sounds like maybe bigeminy.  There is a  grade 3/6 murmur of aortic stenosis and a grade 2/6 diastolic aortic  insufficiency murmur.  LUNGS:  Clear.  ABDOMEN:  Active bowel sounds.  Abdomen is soft, obese, and nontender.  There  are no palpable masses or organomegaly.  EXTREMITIES:  No peripheral edema.  Pedal pulses are palpable  bilaterally.  SKIN:  Warm and dry.  NEUROLOGIC:  Alert and oriented x3.  Motor and sensory exams grossly  normal.   Electrocardiogram shows sinus bradycardia with first-degree AV block.  There is moderate voltage criteria for LVH and nonspecific T-wave  abnormality.  Chest x-ray shows minimal bronchitic changes.  Laboratory  examination on April 17, 2008, showed a hemoglobin of 12.6, white  blood cell count 6.8, and platelet count 270,000.  Coagulation profile  was normal.  Electrolytes were normal  with BUN of 20, creatinine of 0.9,  and albumin 4.6.  Liver function profile was normal.   IMPRESSION:  The patient has a history of bicuspid aortic valve disease  with moderate aortic insufficiency and aortic stenosis by  echocardiogram.  She has a significant ascending aortic aneurysm  extending from the sinus level up to the distal ascending aorta.  I  would estimate by catheterization that this is about 5 cm.  She has  symptoms that appear to be progressing.  I agree at this time to proceed  with replacement of her aortic valve and ascending aorta.  I would like  to obtain a CT coronary angiogram to further evaluate the left main  coronary artery.  I am not sure if this will have enough resolution to  tell us anything further about the ostium of the left main.  I think it  is also important to do a CT angiogram of the thoracic aorta to assess  the distal extent of the aortic aneurysm.  She was sent for this  previously, but they apparently had difficulty in getting an IV started  and therefore it was cancelled by [facility].  We will plan to  send her back to complete this exam.  I will plan to see her back in the  office after this is completed to review the results with her and her  family and further discuss operative plans.  I think she will require  Bentall procedure  with replacement of her aortic valve and ascending  aorta using a mechanical valve conduit and reimplantation of the  coronary arteries.  I discussed the pathology with her and her husband  and daughter including alternatives for surgical treatment.  We  discussed the pros and cons of mechanical and tissue valves.  I  recommended a mechanical valve given her age of 78 years and no  contraindication to anticoagulation.  We discussed the benefits and  risks of surgery including, but not limited to bleeding, blood  transfusion, infection, stroke, myocardial infarction, heart block  requiring permanent pacemaker, and death.  She understands and would  like to proceed with surgery after Christmas.  We will plan to schedule  surgery for  Monday, May 22, 2008.  I will see her back in the office next week  after she has her CT angiogram performed.   Evelene Croon, M.D.  Electronically Signed   BB/MEDQ  D:  05/02/2008  T:  05/03/2008  Job:  [id number]   cc:   Lyn Records, M.D.

## 2010-10-08 NOTE — Assessment & Plan Note (Signed)
OFFICE VISIT   AHMONI, EDGE C  DOB:  1950/10/30                                       03/20/2008  [id number]#:[id number]   The patient is now 2 weeks post laser ablation of her right small  saphenous vein with multiple stab phlebectomies.  She has had no  discomfort in the popliteal fossa and no distal edema.  She is wearing  elastic compression stockings during the daytime and having no  significant problems.   Venous duplex exam was performed today and there is total occlusion of  her right small saphenous vein from 2 cm from the saphenopopliteal  junction distally.  Varicose veins in the mid calf are occluded and the  deep system is widely patent.  She is reassured regarding these  findings.  Will return to see [place] on a p.r.n. basis.  She is pleased with  her results.   Quita Skye Hart [place], M.D.  Electronically Signed   JDL/MEDQ  D:  03/20/2008  T:  03/21/2008  Job:  [id number]

## 2010-10-08 NOTE — Assessment & Plan Note (Signed)
OFFICE VISIT   [name], CHAMBLEE  DOB:  December 30, 1950                                       02/21/2008  GUYQI#:[id number]   The patient underwent laser ablation of her left greater saphenous vein  with multiple stab phlebectomies on September 15 by me for painful  varicosities.  She has done very well with no symptoms of discomfort,  bruising, or swelling in the left leg.  She has been wearing her elastic  compression stockings since her procedure 2 weeks ago and took the  ibuprofen for 1 week and has now discontinued that.   EXAMINATION:  There is no distal edema and stab phlebectomy wounds have  healed nicely.  There is no discomfort along the course of the left  greater saphenous vein.  No ecchymosis noted.   Venous duplex exam reveals total occlusion of the left greater saphenous  vein from the insertion site in the distal thigh up to the  saphenofemoral junction with no evidence of deep venous obstruction.  She was reassured regarding these findings and will now be scheduled for  laser ablation of her right small saphenous vein with multiple stab  phlebectomies.  We will also perform some sclerotherapy in the pretibial  area for a few varicosities there.   Quita Skye Hart [place], M.D.  Electronically Signed   JDL/MEDQ  D:  02/21/2008  T:  02/22/2008  Job:  [id number]

## 2010-10-08 NOTE — Procedures (Signed)
LOWER EXTREMITY VENOUS REFLUX EXAM   INDICATION:  Chronic bilateral varicose veins, left worse than right.  Bilateral lower extremity edema.   EXAM:  Using color-flow imaging and pulse Doppler spectral analysis, the  right and left common femoral, superficial femoral, popliteal, posterior  tibial, greater and lesser saphenous veins are evaluated.  There is  evidence suggesting deep venous insufficiency in the left common femoral  vein only.   The left saphenofemoral junction is not competent.  The left GSV is not  competent with the caliber as described below.   The right proximal short saphenous vein demonstrates incompetency.   GSV Diameter (used if found to be incompetent only)                                            Right    Left  Proximal Greater Saphenous Vein           cm       0.7 cm  Proximal-to-mid-thigh                     cm       0.5 cm  Mid thigh                                 cm       0.4 cm  Mid-distal thigh                          cm       0.4 cm  Distal thigh                              cm       0.3 cm  Knee                                      cm       0.4 cm   IMPRESSION:  1. Left greater saphenous vein reflux is identified with the caliber      ranging from 0.3 cm to 0.7 cm knee to groin.  2. The left greater saphenous vein is not aneurysmal.  3. The left greater saphenous vein is not tortuous.  4. The deep venous system is competent.  5. The right lesser saphenous vein is not competent with diameter      measurements ranging from 0.3 to 0.5 cm from the saphenopopliteal      junction to the ankle.  Mild reflux is seen at the right      saphenofemoral junction (less than 1 second).      ___________________________________________  Quita Skye Hart [place], M.D.   MC/MEDQ  D:  01/17/2008  T:  01/17/2008  Job:  [id number]

## 2010-10-08 NOTE — Op Note (Signed)
[name], [name]                 ACCOUNT NO.:  [id number]   MEDICAL RECORD NO.:  [id number]          PATIENT TYPE:  INP   LOCATION:  2310                         FACILITY:  [facility]   PHYSICIAN:  Evelene Croon, M.D.     DATE OF BIRTH:  [birth year]   DATE OF PROCEDURE:  05/22/2008  DATE OF DISCHARGE:  05/21/2008                               OPERATIVE REPORT   PREOPERATIVE DIAGNOSES:  1. Bicuspid valve with moderate aortic stenosis and severe aortic      insufficiency.  2. A 5.0 x 5.1 cm ascending aortic aneurysm extending into the      proximal aortic arch.   POSTOPERATIVE DIAGNOSES:  1. Bicuspid valve with moderate aortic stenosis and severe aortic      insufficiency.  2. A 5.0 x 5.1 cm ascending aortic aneurysm extending into the      proximal aortic arch.   OPERATIVE PROCEDURES:  1. Median sternotomy.  2. Extracorporeal circulation.  3. Placement of aortic valve and ascending aorta using a 25-mm St.      Jude mechanical valve conduit with re-implantation of the coronary      arteries into the conduit.  4. Replacement of ascending aortic aneurysm and proximal hemiarch      replacement using a 28-mm Hemashield tube graft.  5. Aortic graft-to-innominate artery bypass.   SURGEON:  Evelene Croon, MD   ASSISTANT:  Rowe Clack, PA-C   ANESTHESIA:  General endotracheal.   CLINICAL HISTORY:  This patient is a 60 year old woman with a history of  bicuspid aortic valve and murmur documented as a child.  This has been  followed periodically by echocardiogram.  She did well until this past  summer when she noticed progression in her shortness of breath on  exertion during a trip to [facility].  She had an  echocardiogram done on April 17, 2008, which showed an ejection  fraction of 50-55% with normal left ventricular dimension.  The right  ventricle had normal size and function.  There was trace mitral  regurgitation.  There was moderate calcification in the aortic  valve  with decreased excursion and moderate-to-severe aortic insufficiency  with mild-to-moderate aortic stenosis.  The aortic valve area was  measured at 1.42-1.57 sq. cm.  The peak gradient was 37 with mean  gradient of 22.  She subsequently underwent cardiac catheterization on  April 24, 2008.  This showed PA pressure of 34/13 with a right  ventricular pressure of 36/13 and a mean right atrial pressure of 10.  Wedge pressure was 13, and cardiac output was 6.1.  Aortography showed a  markedly dilated ascending aorta, which started from the aortic root  level and had an estimated diameter about 5 cm.  There is 2+ aortic  insufficiency.  The distal end of the aneurysm cannot be clearly defined  but appeared to extend up to the take off of the innominate artery.  Coronary angiography showed widely patent LAD, left circumflex, and  right coronary arteries.  The ostium of the left main was never  sufficiently visualized.  There was no catheter  dampening noted, but  there was some question of whether there might be some ostial narrowing.  Left ventricular function was not determined.  We subsequently obtained  a CT coronary angiogram to further evaluate the left main coronary  artery and this was read by Cardiology and felt that showed no evidence  of left main ostial stenosis.  The patient also had an evaluation using  CT angiogram of the thoracic aorta and this showed that there was  aneurysmal dilatation of the ascending aorta with a maximum diameter of  5.0 x 5.1 cm.  Aortic root had diameter about 4.2 cm.  The aneurysm  appeared to extend up into the proximal arch with the diameter at the  take off of the innominate artery being about 4 cm.  Between the  innominate and left carotid, the diameter decreased to 3.2 cm.  The  descending aorta measured 2.5 cm.  I felt that it would be best to  replace her aorta up to the level between the innominate and the left  carotid with a bypass up  to the innominate artery.  I discussed the  operative procedure of replacement of her aortic valve and ascending  aorta as well as the proximal aortic arch using a mechanical valve  conduit and Dacron tube graft with bypass up to the innominate artery.  We discussed alternatives, benefits, and risks including but not limited  to bleeding, blood transfusion, infection, stroke, myocardial  infarction, heart block requiring permanent pacemaker, organ  dysfunction, and death.  She understood all of this and agreed to  proceed.  We also discussed the pros and cons of using a mechanical  valve conduit versus a tissue valve conduit, and given her age of 24  years, I felt it would be best to use a mechanical valve conduit and she  was in full agreement with that.   OPERATIVE PROCEDURE:  The patient was taken to the operating room and  placed on the table in supine position.  After induction of general  endotracheal anesthesia, a Foley catheter was placed in the bladder  using sterile technique.  Then, the neck, chest, abdomen, and both lower  extremities were prepped and draped in the usual sterile manner.  Preoperative intravenous antibiotics were given.  Transesophageal  echocardiogram was performed by Dr. Claybon Jabs.  This showed severe  aortic insufficiency with a calcified bicuspid aortic valve with poor  leaflet mobility.  There is moderate stenosis.  Left ventricular  function appeared well preserved with moderate left ventricular  hypertrophy.  Right ventricular function appeared normal.  There was  mild mitral regurgitation.   Then, the chest was opened through a median sternotomy incision and the  pericardium opened midline.  Examination of the heart showed good  ventricular contractility.  The ascending aorta was aneurysmal.   Then, exposure of the right axillary artery for cannulation was  performed.  A transverse incision was made in the right infraclavicular  region and  carried down through the subcutaneous tissue using  electrocautery.  The sternal and clavicular heads of the right  pectoralis major muscle were separated.  The subpectoralis fascia was  incised.  The pectoralis minor muscle was preserved and retracted  laterally along with the neurovascular bundle to the pectoralis major  muscle.  The right axillary artery was easily exposed.  The brachial  plexus was identified and not disturbed.  The patient was then  heparinized, and when an adequate ACT was obtained, the right axillary  artery was clamped proximally and distally.  It was opened  longitudinally and an 8-mm Dacron tube graft was anastomosed to the  axillary artery in an end-to-side manner using continuous 5-0 Prolene  suture.  This anastomosis was lightly coated with a CoSeal for  hemostasis.  Then, the graft was de-aired and it was connected to the  arterial end of the bypass circuit.   Then, attention was returned to the mediastinum.  The right atrial  appendage was identified, and venous cannulation performed using a large  2-stage venous cannula for venous outflow.  This was placed through a  pursestring suture in the right atrial appendage.  A left ventricular  vent was then placed through the right superior pulmonary vein and a  retrograde cardioplegia cannula placed through the right atrium and the  coronary sinus.   The patient then placed on cardiopulmonary bypass and cooled to 18  degrees Centigrade.  While the patient was cooling, the proximal aortic  arch was exposed.  The innominate artery was identified and encircled  with a tape.  It was soft without plaque.  Examination of the aorta  showed that there was still about 4 cm at the level of the innominate  artery and decreased to about 3 cm just beyond the takeoff of the  innominate artery.  Then when the patient had cooled to a temperature of  18 degrees Centigrade, circulatory arrest was begun.  The head was  placed  in a steep Trendelenburg position.  The proximal innominate  artery was then clamped and retrograde right cerebral blood flow was  continued at about 500 mL per minute through the axillary artery  cannula.  Adequate pressure was determined via the right radial arterial  line.  Then, the aortic arch was transected between the innominate and  left common carotid artery.  The proximal innominate artery was divided  just after its takeoff.  The aortic arch and ascending aorta were then  mobilized from the mediastinum using electrocautery.  Then, a 28-mm  Hemashield tube graft with a 10-mm sidearm was cut to the appropriate  length and was anastomosed end-to-end to the aortic arch using  continuous 3-0 Prolene suture with a felt strip to reinforce the  anastomosis.  This anastomosis was lightly coated with CoSeal for  hemostasis.  Then, the 10-mm Dacron sidearm was cut to the appropriate  length and anastomosed end-to-end to the innominate artery using  continuous 4-0 Prolene suture.  This anastomosis was also lightly coated  with CoSeal for hemostasis.  Then, complete circulatory arrest was again  started and the clamp removed from the innominate artery.  An aortic  cross-clamp was then used just proximal to the innominate artery graft  as circulation was restored.  Care was taken to remove all air from the  aortic arch.  Then, full circulation was restored through the axillary  artery cannula.  Total circulatory arrest time was 26 minutes.  Again,  antegrade right cerebral blood flow was maintained throughout the period  of circulatory arrest.  Retrograde cold blood cardioplegia was given  initially at the time of the circulatory arrest was begun and was given  a 20-minute intervals throughout the case to maintain myocardial  temperature less than 10 degrees Centigrade.  Topical hypothermic iced  saline was used.  A temperature probe was placed in the septum and  insulating pad in the  pericardium.   Then, attention was turned to the aortic root.  The aortic aneurysm was  opened longitudinally and the position of the right and left coronary  ostia were identified.  The right coronary ostia appeared normal.  The  left coronary ostia had a slit-like shape but did not appear to be  stenosed.  It was sized with a probe and a 4-mm probe passed easily  through the left main ostium.  There was some sclerosis around the left  main ostium but no stenosis.  Then, the right and left coronary ostia  were excised with buttons of aortic wall around them.  Care was taken to  maintain proper position without rotation.  Then, the native aortic  valve was excised.  Care was taken to remove all particulate debris.  The annulus was decalcified with rongeurs.  This was a bicuspid valve  with complete fusion between the right and noncoronary cusps.  Then, the  aortic annulus and left ventricle were irrigated with iced saline  solution.  No particulate debris was seen.  The annulus was sized and a  25-mm St. Jude mechanical valve conduit was chosen.  This had serial  number [id number], serial [id number].  Then, a series of pledgeted 2-0  Ethibond horizontal mattress sutures were placed around the aortic  annulus.  The sutures were then placed through an autologous pericardial  strip and then through the sewing ring on the valve.  The valve was  lowered into place.  The sutures were tied sequentially.  The valve  seated nicely.  The leaflets were moving normally.   Then, the left coronary anastomosis was performed.  A small opening was  made in the left lateral surface of the graft using an eye cautery.  The  left coronary button was anastomosed to this using continuous 5-0  Prolene suture in an end-to-side manner.  This anastomosis was lightly  coated with CoSeal for hemostasis.   Then, the 2 aortic grafts were cut to the appropriate length and were  anastomosed end-to-end using continuous  3-0 Prolene suture.  This  anastomosis was also lightly coated with CoSeal for hemostasis.  Then,  the graft was pressurized and the right side of the heart filled with  blood to measure the proper position of the right coronary anastomosis.  A small opening was made in the anterior surface of the aortic graft and  the right coronary button was anastomosed to this in an end-to-side  manner using continuous 5-0 Prolene suture.  This anastomosis was  lightly coated with CoSeal for hemostasis.  The patient was being warmed  continuously from the end of the circulatory arrest to a body  temperature of 37 degrees Centigrade.  The left side of the heart was  then completely de-aired and the head placed in a Trendelenburg  position.  An aortic root vent was then placed directly into the  ascending aortic graft.  The cross-clamp was then removed with a time of  113 minutes.  There was spontaneous return of ventricular fibrillation  and the patient was defibrillated into a sinus rhythm.  The anastomoses  were all checked for hemostasis and no additional sutures were required.  Then, 2 temporary right ventricular and right atrial pacing wires placed  both through the skin.  When the patient rewarmed to 37 degrees  Centigrade, she was weaned from cardiopulmonary bypass on no inotropic  agents.  Total bypass time was 196 minutes.  Transesophageal  echocardiogram showed normal-functioning aortic valve prosthesis.  There  was trivial mitral regurgitation.  Left ventricular function appeared  well preserved.  Right ventricular function appeared well preserved.  Hemodynamics remained completely stable with cardiac output of 5 L per  minute.  Then, protamine was given.  A 1 unit of platelets was also  given.  Hemostasis was achieved.  Then, the axillary artery graft was  clamped and cut and ligated with continuous 4-0 Prolene suture in 2  layers.  The venous cannula was removed.  Hemostasis was achieved   without difficulty.  Then, 2 chest tubes were placed with tube in the  posterior pericardium, one in the anterior mediastinum.  Pericardium  could not be reapproximated over the heart.  The sternum was then closed  with double #6 stainless steel wires.  The fascia was closed with  continuous #1 Vicryl suture.  Subcutaneous tissue was closed with continuous 2-0 Vicryl and the skin with a 3-0 Vicryl subcuticular  closure.  The right subclavicular incision was closed in layers in a  similar manner.  The sponge, needle, and instrument counts were correct  according to the scrub nurse.  Dry sterile dressing were applied over  the incisions around the chest tubes which were Pleur-Evac suction.  The  patient remained hemodynamically stable and was transported to the SICU  in guarded but stable condition.      Evelene Croon, M.D.  Electronically Signed     BB/MEDQ  D:  05/22/2008  T:  05/23/2008  Job:  [id number]   cc:   CTS Office  Lyn Records, M.D.

## 2010-10-08 NOTE — Assessment & Plan Note (Signed)
OFFICE VISIT   [name], [name]  DOB:  05-10-1951                                        June 26, 2008  CHART #:  [id number]   HISTORY:  The patient is a 60 year old female who on 05/22/2008,  underwent a placement of an aortic valve and ascending aorta using a 25-  mm St. Jude Mechanical valve conduit with reimplantation of the coronary  arteries in to the conduit.  Additionally, she underwent replacement of  the ascending aortic aneurysm and proximal hemiarch replacement using a  28-mm Hemashield tube graft and also an aortic graft to innominate  artery bypass.  This was done by Dr. Laneta Simmers for #1 bicuspid aortic valve  with moderate aortic stenosis and severe aortic insufficiency.  Additionally, he had a 5.0 x 5.1 cm ascending aortic aneurysm extending  into the proximal aortic arch.  The patient did quite well  postoperatively.  She had no significant postoperative sequelae.  She  was discharged on 05/28/2008.  She is seen on today's date in a routine  followup in the office following her surgery.  She states that she has  had some difficulties postoperatively with tachy palpitations.  She  keeps a journal of her blood pressure and heart rate, and heart rate  typically is in the high 90s to low 100s.  Her blood pressure has been  under excellent control.  She has been seen by Dr. Katrinka Blazing and is under  his care for management of anticoagulation therapy and Coumadin dosing.  She denies shortness of breath.  She denies fevers, chills, or other  constitutional symptoms.  She has started in cardiac rehabilitation and  is enjoying this thus far.  Overall, she feels as though she is making  significant strides in her recovery and is requiring only Tylenol at  this point for pain medication.   CHEST X-RAY:  Chest x-ray was obtained on today's date.  It reveals no  significant effusions, no evidence of congestive failure, no significant  infiltrates.   PHYSICAL  EXAMINATION:  VITAL SIGNS:  Blood pressure is 110/79, pulse is  97 and regular, respirations 18, and oxygen saturation is 100% on room  air.  GENERAL:  This is a well-developed adult female in no acute distress.  PULMONARY:  Clear lungs throughout.  CARDIAC:  Regular rate and rhythm.  Normal S1 and S2.  No murmurs  appreciated.  No gallops.  No rubs.  EXTREMITY:  No edema.  Incisions are all healing well without evidence  of infection.   ASSESSMENT:  The patient is making very good progress.  I have  instructed her that she can begin to drive at this point.  I  reemphasized lifting restrictions and she understands, and will continue  her primary rehabilitation with the cardiac phase II program at this  point.  I have given her prescription for her metoprolol to be increased  from 25 mg b.i.d. to 50 mg b.i.d.  I have also instructed her to contact  Dr. Michaelle Copas office if she continues to have these tachy palpitations,  and also to continue to keep a log of both her blood pressure, weight,  and pulse as she is doing.  As she has no current surgical issues, we  will see her again on a p.r.n. basis.   Rowe Clack, P.A.-C.  WEG/MEDQ  D:  06/26/2008  T:  06/26/2008  Job:  [id number]   cc:   Lyn Records, M.D.

## 2010-10-08 NOTE — Cardiovascular Report (Signed)
[name], [name]                 ACCOUNT NO.:  [id number]   MEDICAL RECORD NO.:  [id number]          PATIENT TYPE:  OIB   LOCATION:  1961                         FACILITY:  [facility]   PHYSICIAN:  Lyn Records, M.D.   DATE OF BIRTH:  1951-04-27   DATE OF PROCEDURE:  04/24/2008  DATE OF DISCHARGE:  04/24/2008                            CARDIAC CATHETERIZATION   INDICATION:  The patient has a history of known aortic valve disease  with increasing symptoms and studies being done to prepare the patient  for aortic valve replacement and possible aortic root replacement.   PROCEDURES PERFORMED:  1. Left heart catheterization.  2. Right heart catheterization.  3. Coronary angiography.  4. Aortography.  5. Left ventriculography, not performed.   DESCRIPTION:  After informed consent, a 5-French sheath was placed in  the femoral artery on the right and a 7-French sheath in the right  femoral vein.  We used a balloon-tipped Swan-Ganz catheter for right  heart hemodynamic recordings and oximetry sample.  We also performed a  thermodilution cardiac output with the catheter.   We performed left heart catheterization with difficulty because of large  aortic root and a tremendous systolic jet through the aortic valve  orifice that would cause catheter motion and difficulty with seeding.  We upgraded to a 5-French catheter system.  We performed right coronary  angiography with a no-torque right catheter.  We performed left coronary  angiography with a #5 left Judkins catheter.  The coronary catheters  were left heart caths.  We used a multipurpose, a Judkins right, and an  Amplatz one catheter, and an attempt across the aortic valve were  unsuccessful using a straight tip wire and 5-French catheters.   We performed aortic root angiography using a 5-French angled pigtail  catheter at 18 mL per second for a total of 50 mL of contrast.  The case  was then terminated, and the sheaths were  removed.   RESULTS:  1. Hemodynamic data:      a.     Aortic pressure 149/73.      b.     Left ventricular pressure not recorded.      c.     Right atrial mean pressure 10 mmHg.      d.     Right ventricular pressure 36/13 mmHg.      e.     Pulmonary artery pressure 34/13 mmHg.      f.     Pulmonary capillary wedge pressure 13 mmHg.  Cardiac outputs       were felt to be normal with thermo output of 6.1 and a Fick output       pending.  2. Aortography:  The ascending aorta demonstrates post-stenotic      dilatation with aneurysm formation.  The root appears to be between      4 and 5 cm in diameter.  There was 2+ aortic regurgitation noted.  3. Coronary angiography:  The left main coronary ostium was never      sufficiently visualized.  No catheter damping was noted.  There  is      a question, however, about the possibility of left main disease.      Most views, however, seem to demonstrate that the left main lumen      is widely patent.      a.     Left anterior descending coronary:  Widely patent.      b.     Circumflex coronary artery:  Widely patent.      c.     Right coronary:  The right coronary is dominant and widely       patent.  4. Left ventriculography:  Not performed.  LV function appears      relatively normal with normal cavity size based upon the aortic      regurgitant jet that partially eliminated the LAD.   ASSESSMENT:  1. Moderately severe-to-severe aortic valve disease with aortic      stenosis and aortic regurgitation.  Both graded to be moderate to      moderately severe.  2. Widely patent left anterior descending, circumflex, and right      coronary with possible ostial/proximal aortic with proximal to      ostial left main narrowing, although no significant catheter      damping was noted.  The concern about the left main was that there      was no reflux of contrast into the aortic root.  This could have      been because of the increased flow through the  coronaries and the      relatively diminished quantity of contrast delivered using the 5-      [place] catheters.  3. Ascending aortic aneurysm due to post-stenotic dilatation with      aortic diameter greater than 4 cm.   PLAN:  1. CT angiogram of the aorta and left main coronary.  2. Referral to [facility] for evaluation and consideration of valve      replacement.      Lyn Records, M.D.  Electronically Signed     HWS/MEDQ  D:  04/24/2008  T:  04/24/2008  Job:  [id number]   cc:   [name] [facility]

## 2010-10-08 NOTE — Procedures (Signed)
DUPLEX DEEP VENOUS EXAM - LOWER EXTREMITY   INDICATION:  One week followup of a left greater saphenous vein laser  ablation.   HISTORY:  Edema:  No.  Trauma/Surgery:  Left greater saphenous vein laser ablation on 02/08/08.  Pain:  No.  PE:  No.  Previous DVT:  No.  Anticoagulants:  Other:   DUPLEX EXAM:                CFV   SFV   PopV  PTV    GSV                R  L  R  L  R  L  R   L  R  L  Thrombosis    o  o     o     o      o     +  Spontaneous   +  +     +     +      +     0  Phasic        +  +     +     +      +     0  Augmentation  +  +     +     +      +     0  Compressible  +  +     +     +      +     0  Competent        +           +            +   Legend:  + - yes  o - no  p - partial  D - decreased   IMPRESSION:  1. No evidence of deep venous thrombosis noted in the left lower      extremity.  2. The left greater saphenous vein is totally occluded, extending from      the distal insertion site to 0.5 cm from the saphenofemoral      junction.  3. No reflux was visualized in the left common femoral vein or at the      saphenofemoral junction level.     _____________________________  Quita Skye. Hart Rochester, M.D.   CH/MEDQ  D:  02/21/2008  T:  02/21/2008  Job:  [id number]

## 2010-10-08 NOTE — Assessment & Plan Note (Signed)
OFFICE VISIT   [name], SUTTER  DOB:  August 15, 1950                                        May 09, 2008  CHART #:  [id number]   The patient returns today for followup and further discussion in  reference to upcoming surgery.  She underwent a CT coronary angiogram on  05/04/2008 to evaluate the left main coronary artery and this was read  by Dr. Katrinka Blazing and there was no evidence of left main coronary artery  disease.  The left main did appear to be a relatively short artery.  She  also want a CT angiogram of the chest to evaluate the entire aorta.  This showed that there was aneurysmal ascending aorta with a maximum  diameter of about 5 cm.  The exact dimension was 5.0 x 5.1 cm.  The  aortic valve was calcified.  The aortic root had a diameter about 4.2  cm.  The aneurysm appeared to have extend up into the proximal arch with  the diameter at the takeoff of the innominate artery being about 4 cm.  Between the innominate and left carotid, the diameter was about 3.2 cm  by my measurement.  The descending aorta was about 2.5 cm.  I think, it  would be beneficial to perform the distal graft anastomosis between the  innominate and the carotid artery with the grafts up to the innominate  artery.  I discussed the operative procedure of replacement of her  aortic valve and the ascending aorta including the proximal aortic arch  with aortoinnominate bypass with the patient, her daughter, and husband.  We discussed alternatives, benefits, and risks including, but not  limited to bleeding, blood transfusion, infection, stroke, myocardial  infarction, heart block requiring permanent pacemaker, organ failure,  and death.  We also discussed the use of a mechanical valve conduit and  they are in full agreement with that.  We will plan to do surgery on  Monday 05/22/2008.   Evelene Croon, M.D.  Electronically Signed   BB/MEDQ  D:  05/09/2008  T:  05/09/2008  Job:  [id number]

## 2010-10-08 NOTE — Discharge Summary (Signed)
[name], [name]                 ACCOUNT NO.:  [id number]   MEDICAL RECORD NO.:  [id number]          PATIENT TYPE:  INP   LOCATION:  2015                         FACILITY:  [facility]   PHYSICIAN:  Evelene Croon, M.D.     DATE OF BIRTH:  1951-05-02   DATE OF ADMISSION:  05/22/2008  DATE OF DISCHARGE:  05/28/2008                               DISCHARGE SUMMARY   HISTORY:  The patient is a 60 year old female referred to Dr. Laneta Simmers to  evaluate for surgical treatment of her aortic valve disease and  ascending aortic aneurysm.  The patient has a history of a bicuspid  aortic murmur since childhood.  She has been followed periodically with  echocardiogram.  She had done well until this past summer when she noted  some progression in her shortness of breath on exertion during a trip to  the [employer].  She developed dyspnea on exertion as well as  tightness in her chest and presyncope.  Since that time, she has noted  decreased energy level and has been taking frequent naps.  She has also  had one episode of dizziness as well as dyspnea on exertion.  An  echocardiogram was done in November 2009, which revealed an ejection  fraction of 50-55% with normal left ventricular dimensions.  The right  ventricle had normal size and function.  There was trace mitral  regurgitation.  There was felt to be moderate calcification of the  aortic valve with decreased excursion and moderate to severe aortic  insufficiency with mild to moderate aortic stenosis.  The aortic valve  was measured at 1.4 to 1.57.  Peak gradient was 37 with a mean of 22.  She subsequently underwent cardiac catheterization on April 24, 2008.  This revealed PA pressure of 34/13 with a right ventricular pressure of  36/13 and mean right atrial pressure of 10.  Wedge pressure was 13 with  a cardiac output of 6.1.  Aortography revealed a markedly dilated  ascending aorta, which started from the root level and had an  estimated  diameter of 5 cm.  There was 2+ aortic insufficiency.  The distal end of  the aneurysm could not be clearly defined, but appear to extend at the  take off at the innominate artery.  Coronary angiography revealed patent  arteries, but the ostium of the left main was never sufficiently  visualized.  Subsequent to this a CT scan was performed which did reveal  the left main ostium to have no significant disease.  Due to the  increasing symptoms and findings noted above, she was recommended to  proceed with surgical repair and was admitted in this hospitalization  for the procedure.   REVIEW OF SYMPTOMS:  Please see history and physical.   ALLERGIES:  SULFA.   MEDICATIONS:  Prior to admission,  1. Singulair 10 mg daily.  2. Ambien 12.5 mg daily.  3. Trilipix 134 mg daily.  4. Nasonex p.r.n.  5. Cymbalta 60 mg daily.   PAST MEDICAL HISTORY:  Aortic valve and aneurysm disease as described  above.  Other conditions  include,  1. History of varicose veins.  2. History of asthma.  3. History of hypercholesterolemia.  4. History of diverticulitis.  5. History of bowel obstruction status post laparotomy.  6. History of fibromyalgia.  7. History of depression.  8. History of hysterectomy and bilateral salpingo-oophorectomy.  9. History of cholecystectomy.   Family history, social history, and physical exam, please see the  history and physical done at the time of admission.   HOSPITAL COURSE:  The patient was admitted electively and on May 22, 2008, she was taken to the operating room where she underwent a  resection, grafting of her ascending aortic aneurysm, and placement of a  25-mm St. Jude mechanical aortic valve conduit.  The aortic aneurysm was  replaced to the level of the left carotid and there was reimplantation  of the innominate artery as well as a Bentall procedure.  The patient  tolerated the procedure well, was taken to the surgical intensive care  unit  in stable condition.  Postoperative hospital course, the patient  has overall done quite well.  She has maintained stable hemodynamics.  She was weaned from the ventilator without difficulty.  She was weaned  from inotrops without difficulty.  All routine lines, monitors, drainage  devices were discontinued in standard fashion.  She has responded well  to a gentle diuresis.  She has been started on Coumadin.  Her most  recent INR dated May 29, 2007, is 1.7 and she will be discharged on 5  mg of Coumadin daily.  Oxygen has been weaned and she maintains good  saturations on room air.  She has maintained normal sinus rhythm with  occasional premature ventricular contractions.  Her incisions are all  healing well.  Her hemoglobin and hematocrit are stable with a moderate  acute blood loss anemia.  Her most recent H and H are 8.7 and 25.2  respectively.  Her renal function is stable.  Most recent BUN and  creatinine dated on May 28, 2007, are 14 and 0.71 respectively.  Overall, her status is felt to be quite stable for discharge on today's  date on May 28, 2008.   CONDITION ON DISCHARGE:  Stable and improving.   MEDICATIONS ON DISCHARGE:  1. Singulair 10 mg nightly.  2. Ambien 12.5 mg nightly p.r.n.  3. Trilipix 135 mg daily.  4. Nasonex p.r.n.  5. Cymbalta 60 mg daily.  6. Multivitamin 1 daily.  7. Vitamin C one daily.  8. Coenzyme Q10 daily.  9. Aspirin 81 mg daily.  10.Coumadin 5 mg daily and as directed by Dr. Corky Sing office.  11.Lopressor 25 mg twice daily.  12.Oxycodone 5 mg 1-2 every 6 hours as needed for pain.   INSTRUCTIONS:  The patient received written instructions regarding  medications, activity, diet, wound care, and followup.   FOLLOWUP:  Coumadin checks through Dr. Michaelle Copas office on May 31, 2008.  Additionally she was instructed to see Dr. Katrinka Blazing 2 weeks, Dr.  Laneta Simmers 3 weeks.   FINAL DIAGNOSES:  Status post repair of the aortic hemiarch with   innominate artery.  Reimplantation, Bentall procedure, and 25-mm St.  Jude aortic valve conduit.   OTHER DIAGNOSES:  1. Allergy to SULFA.  2. History of depression.  3. History of fibromyalgia.  4. History of varicose veins.  5. History of hypercholesterolemia.  6. History of diverticulitis.  7. History of bowel obstruction status post laparotomy.  8. History of hysterectomy and bilateral salpingo-oophorectomy.  9. History of cholecystectomy.  10.Moderate acute blood loss anemia.  11.History of bilateral varicose veins procedure by Dr. Hart Rochester.      Rowe Clack, P.A.-C.      Evelene Croon, M.D.  Electronically Signed    WEG/MEDQ  D:  05/28/2008  T:  05/29/2008  Job:  [id number]   cc:   Lyn Records, M.D.  Evelene Croon, M.D.

## 2010-10-08 NOTE — Consult Note (Signed)
[name], [name]                 ACCOUNT NO.:  [id number]   MEDICAL RECORD NO.:  [id number]          PATIENT TYPE:  OIB   LOCATION:  2899                         FACILITY:  [facility]   PHYSICIAN:  Lyn Records, M.D.   DATE OF BIRTH:  1951/01/10   DATE OF CONSULTATION:  07/21/2008  DATE OF DISCHARGE:  07/21/2008                                 CONSULTATION   INDICATIONS:  Atrial flutter.   DESCRIPTION:  The patient was advised concerning the risks and the  nature of electrical cardioversion.  The anterior and posterior  electrode configuration was used.  Propofol, 170 mg IV was given and  monitored by Dr. Ivin Booty.  A 200-watt seconds of biphasic energy was  discharged with reversion to normal sinus rhythm.  The patient awakened  with no complications.  Normal sinus rhythm was established and  maintained post conversion.   CONCLUSIONS:  Successful elective electrical cardioversion from atrial  flutter to normal sinus rhythm.   PLAN:  Decrease amiodarone to 200 mg daily.  Continue metoprolol 50 mg  twice daily.  Office followup with EKG in 5-7 days.      Lyn Records, M.D.  Electronically Signed     HWS/MEDQ  D:  07/21/2008  T:  07/21/2008  Job:  [id number]   cc:   Evelene Croon, M.D.

## 2010-10-08 NOTE — Consult Note (Signed)
VASCULAR SURGERY CONSULTATION   [name], [name]  DOB:  [birth year]                                       01/17/2008  EAVWU#:[id number]   The patient is a 60 year old female patient with severe venous  insufficiency of both lower extremities, left greater than right which  has become increasingly symptomatic and has been affecting her daily  living over the last few years, particularly the last several months.  She has throbbing aching, cramping discomfort with a heavy sensation in  both feet.  She has no history of deep venous thrombosis,  thrombophlebitis, stasis ulcerations, hemorrhage or cellulitis in the  legs, but has had increasing pain which has not been improved by the use  of long-leg graduated compression (30 mm - 40 mm) stockings plus taking  ibuprofen on a regular basis.  She was evaluated by [facility] in April 2009 and venous duplex exam revealed reflux  the left great saphenous vein with symptomatic varicosities on the left  and also reflux in both small saphenous veins with symptomatic  varicosities, right greater than left.  She was felt to have CAP class  IV venous insufficiency.  She reports today for a second opinion  regarding this diagnosis and our recommendations.   PAST MEDICAL HISTORY:  1. History of mitral valve prolapse.  2. No coronary artery disease.  3. COPD.  4. Migraine headaches.  5. Negative for diabetes, stroke, hypertension, hyperlipidemia.   PREVIOUS SURGERY:  Includes hysterectomy.   ALLERGIES:  To sulfa.   FAMILY HISTORY:  Is positive for spider veins and blood clots in the  lower extremities in her mother, negative coronary artery disease,  diabetes and stroke.   SOCIAL HISTORY:  Negative for tobacco and alcohol.   PHYSICAL EXAMINATION:  Blood pressure 140/70 heart rate 60, respirations  14.  General:  She is a middle-aged female in no apparent distress.  Alert and oriented x3.  Neck is  supple 3+ carotid pulses palpable.  No  bruits are audible.  Neurologic:  Normal.  No palpable adenopathy in the  neck.  Chest:  Clear to auscultation.  Cardiovascular:  Regular rhythm  with no murmurs.  Abdomen:  Soft, nontender with no palpable masses.  Extremity exam reveals both legs have 1+ edema.  Left leg has a large  greater saphenous varicosities in the distal thigh and medial calf area.  No hyperpigmentation or active ulceration or infection.  There is some  small prominent reticular veins in the lateral ankle area distally.  On  the right side there are no greater saphenous varicosities in the thigh,  but there are significant varicosities in the right posterior calf  associated with the small saphenous system.  There is no active  ulceration or infection, but there is mild distal edema on the right.   Venous duplex exam was performed at the [facility] office today with the  following findings.  1. Deep venous system widely patent and normal.  2. The left great saphenous system revealed reflux from the junction      distally communicating with a large varicosities.  3. The right small saphenous system had reflux but the right great      saphenous system did not.   I feel the patient has been treated with a conservative regimen with no  improvement is symptomatic  on both sides and these symptoms are  definitely affecting her daily living and have not responded to  conservative treatment.  I would recommend  1. The laser ablation of left great saphenous vein with multiple stab      phlebectomies to be followed by  2. Laser ablation of the right small saphenous vein with multiple stab      phlebectomies.  I think this would help eliminate her symptoms of      venous insufficiency and we will proceed with precertification.   Quita Skye Hart Rochester, M.D.  Electronically Signed  JDL/MEDQ  D:  01/17/2008  T:  01/18/2008  Job:  [id number]   cc:   Molly Maduro L. Foy [place], M.D.

## 2010-10-23 ENCOUNTER — Telehealth: Payer: Self-pay | Admitting: Family Medicine

## 2010-10-23 MED ORDER — SCOPOLAMINE 1 MG/3DAYS TD PT72: 1.0000 | MEDICATED_PATCH | TRANSDERMAL | Status: DC

## 2010-10-23 NOTE — Telephone Encounter (Signed)
Pt advised rx sent to pharmacy. 

## 2010-10-23 NOTE — Telephone Encounter (Signed)
She can have an rx for a scopolamine patch. They come in a box of 4 and each is good for 72 hours. 1.5mg /72hr. Apply 1 patch topically behind ear, change every 72 hours. Disp: 1 # box, no rf. Most common side effect is dry mouth

## 2010-10-23 NOTE — Telephone Encounter (Signed)
Patient wants scopolamine for her motion sickness she is going on a cruise sat. June 2,2012.

## 2010-11-07 ENCOUNTER — Ambulatory Visit: Payer: PRIVATE HEALTH INSURANCE | Admitting: Family Medicine

## 2010-11-07 DIAGNOSIS — Z0289 Encounter for other administrative examinations: Secondary | ICD-10-CM

## 2010-11-16 ENCOUNTER — Other Ambulatory Visit: Payer: Self-pay | Admitting: Family Medicine

## 2010-11-18 ENCOUNTER — Ambulatory Visit (INDEPENDENT_AMBULATORY_CARE_PROVIDER_SITE_OTHER): Payer: PRIVATE HEALTH INSURANCE | Admitting: Family Medicine

## 2010-11-18 ENCOUNTER — Encounter: Payer: Self-pay | Admitting: Family Medicine

## 2010-11-18 DIAGNOSIS — L0291 Cutaneous abscess, unspecified: Secondary | ICD-10-CM

## 2010-11-18 DIAGNOSIS — N760 Acute vaginitis: Secondary | ICD-10-CM

## 2010-11-18 DIAGNOSIS — B9689 Other specified bacterial agents as the cause of diseases classified elsewhere: Secondary | ICD-10-CM | POA: Insufficient documentation

## 2010-11-18 DIAGNOSIS — L0390 Cellulitis, unspecified: Secondary | ICD-10-CM

## 2010-11-18 DIAGNOSIS — L02416 Cutaneous abscess of left lower limb: Secondary | ICD-10-CM

## 2010-11-18 DIAGNOSIS — R609 Edema, unspecified: Secondary | ICD-10-CM

## 2010-11-18 DIAGNOSIS — L03119 Cellulitis of unspecified part of limb: Secondary | ICD-10-CM

## 2010-11-18 DIAGNOSIS — L02419 Cutaneous abscess of limb, unspecified: Secondary | ICD-10-CM | POA: Insufficient documentation

## 2010-11-18 DIAGNOSIS — A499 Bacterial infection, unspecified: Secondary | ICD-10-CM

## 2010-11-18 HISTORY — DX: Cutaneous abscess of left lower limb: L02.416

## 2010-11-18 LAB — RENAL FUNCTION PANEL
Albumin: 4.5 g/dL (ref 3.5–5.2)
BUN: 30 mg/dL — ABNORMAL HIGH (ref 6–23)
CO2: 25 mEq/L (ref 19–32)
Calcium: 9.3 mg/dL (ref 8.4–10.5)
Chloride: 103 mEq/L (ref 96–112)
Creat: 1.11 mg/dL — ABNORMAL HIGH (ref 0.50–1.10)
Glucose, Bld: 82 mg/dL (ref 70–99)
Phosphorus: 3.4 mg/dL (ref 2.3–4.6)
Potassium: 4.5 mEq/L (ref 3.5–5.3)
Sodium: 140 mEq/L (ref 135–145)

## 2010-11-18 LAB — CBC WITH DIFFERENTIAL/PLATELET
Basophils Absolute: 0 10*3/uL (ref 0.0–0.1)
Basophils Relative: 0.8 % (ref 0.0–3.0)
Eosinophils Absolute: 0.2 10*3/uL (ref 0.0–0.7)
Eosinophils Relative: 3.6 % (ref 0.0–5.0)
HCT: 32.1 % — ABNORMAL LOW (ref 36.0–46.0)
Hemoglobin: 11.2 g/dL — ABNORMAL LOW (ref 12.0–15.0)
Lymphocytes Relative: 30.5 % (ref 12.0–46.0)
Lymphs Abs: 1.6 10*3/uL (ref 0.7–4.0)
MCHC: 34.8 g/dL (ref 30.0–36.0)
MCV: 89.3 fl (ref 78.0–100.0)
Monocytes Absolute: 0.5 10*3/uL (ref 0.1–1.0)
Monocytes Relative: 10.1 % (ref 3.0–12.0)
Neutro Abs: 3 10*3/uL (ref 1.4–7.7)
Neutrophils Relative %: 55 % (ref 43.0–77.0)
Platelets: 265 10*3/uL (ref 150.0–400.0)
RBC: 3.6 Mil/uL — ABNORMAL LOW (ref 3.87–5.11)
RDW: 13 % (ref 11.5–14.6)
WBC: 5.4 10*3/uL (ref 4.5–10.5)

## 2010-11-18 MED ORDER — CEFDINIR 300 MG PO CAPS: 300.0000 mg | ORAL_CAPSULE | Freq: Two times a day (BID) | ORAL | Status: AC

## 2010-11-18 MED ORDER — CLINDAMYCIN PHOSPHATE 2 % VA CREA: 1.0000 | TOPICAL_CREAM | Freq: Every day | VAGINAL | Status: AC

## 2010-11-18 MED ORDER — FUROSEMIDE 20 MG PO TABS: 20.0000 mg | ORAL_TABLET | Freq: Every day | ORAL | Status: DC

## 2010-11-18 MED ORDER — ALIGN 4 MG PO CAPS: 1.0000 | ORAL_CAPSULE | Freq: Every day | ORAL | Status: DC

## 2010-11-18 NOTE — Progress Notes (Signed)
[name] [id number] 03/23/1951 11/18/2010      Progress Note-Follow Up  Subjective  Chief Complaint  Chief Complaint  Patient presents with  . Edema    ankles swelling    HPI  Patient is in today for evaluation of several complaints. One is worsening peripheral edema most notably in her feet but also present in her hands for the last 1-2 weeks. She reports it is worse at the end of day and if he can become achy and uncomfortable. She has not tried any intervention such as putting her feet up . She is denying any fevers, chills, chest pain or palpitations. She also has a skin tear on her left tibial plateau after scraping her leg Doppler on the chart. Over the last week it has slowly gotten more red and somewhat uncomfortable. But no discharge pus is developed. No body aches or signs of systemic illness. Her last complaint is of some vaginal discharge she describes as malodorous. She has occasional itching and strike him into trouble with cream over-the-counter but that has not been helpful. The odor is copious and foul smelling but there is no significant color. No change in partners. No abdominal pain dysuria GI or GU complaints noted today.  Past Medical History  Diagnosis Date  . Anemia   . Anxiety   . Depression   . Diverticulosis   . Elevated cholesterol   . Obesity   . THYROMEGALY 07/22/2010  . Overweight 03/11/2010  . Mixed hyperlipidemia 03/11/2010  . MEASLES, HX OF 03/11/2010  . KNEE PAIN, RIGHT 05/28/2010  . HYPERTRIGLYCERIDEMIA 03/11/2010  . HEART MURMUR, HX OF 03/11/2010  . FIBROIDS, UTERUS 03/11/2010  . Diarrhea 08/05/2010  . CONSTIPATION 03/11/2010  . CHICKENPOX, HX OF 03/11/2010  . BIPOLAR AFFECTIVE DISORDER 03/11/2010  . AORTIC VALVE REPLACEMENT, HX OF 03/11/2010  . ANEMIA 05/28/2010  . Acute bronchitis 07/22/2010  . Abdominal pain, generalized 03/19/2010  . Peripheral edema 11/18/2010  . Bacterial vaginitis 11/18/2010    Past Surgical History  Procedure Date    . Appendectomy     Required revision for EColi infection, required recurrent packing  . Bowel obstruction     Requiring adhesions to be removed  . Abdominal hysterectomy     sb/l spo, total  . Aortic valve replacement   . Open heart with a cardiac aneurysm repair   . Cholecystectomy   . Tubal ligation   . Varicose vein surgery b/l legs   . Childhood exploratory surgery of heart   . Hernia repair 08-16-10    Family History  Problem Relation Age of Onset  . Scoliosis Mother   . Arthritis Mother     Rheumatoid  . Aneurysm Mother     heart  . Alcohol abuse Father   . Depression Sister   . Depression Brother   . Alcohol abuse Brother   . Cancer Maternal Grandmother     colon  . Diabetes Maternal Grandfather   . Heart disease Maternal Grandfather   . Alcohol abuse Maternal Grandfather   . Depression Paternal Grandmother   . Diabetes Paternal Grandmother   . Depression Paternal Grandfather   . Diabetes Paternal Grandfather   . Depression Sister   . Alcohol abuse Brother   . Depression Brother   . Heart disease Brother     History   Social History  . Marital Status: Married    Spouse Name: N/A    Number of Children: N/A  . Years of Education: N/A  Occupational History  . Not on file.   Social History Main Topics  . Smoking status: Former Smoker    Quit date: 05/26/1990  . Smokeless tobacco: Never Used  . Alcohol Use: 0.0 oz/week    0 drink(s) per week     very serious occasions  . Drug Use: No  . Sexually Active: No   Other Topics Concern  . Not on file   Social History Narrative  . No narrative on file    Current Outpatient Prescriptions on File Prior to Visit  Medication Sig Dispense Refill  . acetaminophen (TYLENOL) 325 MG tablet Take 650 mg by mouth every 6 (six) hours as needed.        . Acetaminophen-Codeine (TYLENOL/CODEINE #3) 300-30 MG per tablet Take 1 tablet by mouth every 6 (six) hours as needed. Cough/ pain       . amiodarone (PACERONE)  200 MG tablet Take 200 mg by mouth daily. 1/2 tab       . aspirin 81 MG tablet Take 81 mg by mouth daily.        . clonazePAM (KLONOPIN) 0.5 MG tablet Take 0.5 mg by mouth 2 (two) times daily. 1/2 tab       . DULoxetine (CYMBALTA) 30 MG capsule Take 30 mg by mouth daily.        . fenofibrate 160 MG tablet Take 160 mg by mouth daily.        . Ferrous Fumarate-Folic Acid 324-1 MG TABS Take by mouth daily.        Marland Kitchen Hyoscyamine Sulfate 0.125 MG TBDP Place under the tongue every 4 (four) hours as needed. Every 2-4 hours for abdominal pain      . lamoTRIgine (LAMICTAL) 150 MG tablet Take 150 mg by mouth daily. 1/2 tab       . lisinopril-hydrochlorothiazide (PRINZIDE,ZESTORETIC) 10-12.5 MG per tablet Take 1 tablet by mouth daily.        Marland Kitchen lubiprostone (AMITIZA) 24 MCG capsule Take 24 mcg by mouth 2 (two) times daily with meals.        Marland Kitchen QUEtiapine (SEROQUEL) 200 MG tablet Take 200 mg by mouth at bedtime. 1-2 tab       . scopolamine (TRANSDERM-SCOP) 1.5 MG Place 1 patch (1.5 mg total) onto the skin every 3 (three) days.  4 patch  0  . senna (SENOKOT) 8.6 MG tablet Take 1 tablet by mouth at bedtime. Five tablets       . SINGULAIR 10 MG tablet TAKE 1 TABLET EVERY DAY  30 tablet  1  . warfarin (COUMADIN) 5 MG tablet Take 5 mg by mouth daily. 1/2 to 1 tab       . DISCONTD: lactobacillus acidophilus & bulgar (LACTINEX) chewable tablet Chew 1 tablet by mouth 3 (three) times daily with meals. 2 tabs po tid X 30 days       . DISCONTD: metroNIDAZOLE (FLAGYL) 500 MG tablet Take 500 mg by mouth 3 (three) times daily. X 10 days       . pravastatin (PRAVACHOL) 40 MG tablet Take 40 mg by mouth at bedtime.        Marland Kitchen DISCONTD: ciprofloxacin (CIPRO) 500 MG tablet Take 500 mg by mouth 2 (two) times daily. X 10 days         Allergies  Allergen Reactions  . Sulfonamide Derivatives     Review of Systems  Review of Systems  Constitutional: Negative for fever and malaise/fatigue.  HENT: Negative for congestion.  Eyes: Negative for discharge.  Respiratory: Negative for shortness of breath.   Cardiovascular: Negative for chest pain, palpitations and leg swelling.  Gastrointestinal: Negative for nausea, abdominal pain and diarrhea.  Genitourinary: Negative for dysuria.  Musculoskeletal: Negative for falls.  Skin: Positive for rash.  Neurological: Negative for loss of consciousness and headaches.  Endo/Heme/Allergies: Negative for polydipsia.  Psychiatric/Behavioral: Negative for depression and suicidal ideas. The patient is not nervous/anxious and does not have insomnia.     Objective  BP 111/66  Pulse 77  Temp(Src) 98.3 F (36.8 C) (Oral)  Ht 5' 7.25" (1.708 m)  Wt 212 lb 12.8 oz (96.525 kg)  BMI 33.08 kg/m2  SpO2 94%  Physical Exam  .ros  Lab Results  Component Value Date   TSH 2.171 03/13/2010   Lab Results  Component Value Date   WBC 7.9 08/19/2010   HGB 9.6* 08/19/2010   HCT 29.6* 08/19/2010   MCV 92.8 08/19/2010   PLT 224 08/19/2010   Lab Results  Component Value Date   CREATININE 1.26* 08/15/2010   BUN 24* 08/15/2010   NA 141 08/15/2010   K 4.5 08/15/2010   CL 104 08/15/2010   CO2 29 08/15/2010   Lab Results  Component Value Date   ALT 20 08/15/2010   AST 27 08/15/2010   ALKPHOS 22* 08/15/2010   BILITOT 0.6 08/15/2010     Assessment & Plan  Peripheral edema Discussed at length with the patient the multifactorial nature of edema. She notes it in her hands as well as her feet at the end of the day but the feet are worse. B/L the same. Encouraged to minimize sodium, elevate feet above heart bili 15 minutes twice a day. Encouraged use of Jobst stockings to mercury and rhythm obese which is going to be on her feet for a long period of time on in a.m. off in p.m. She is also given Lasix due to the she can take 20 mg tablet once daily for the next week and then back off as needed. She is to weigh herself daily and anytime she gains more than 2-3 pounds overnight she will take 1 or  with any edema she may take one occasionally. If this is not successful we will see her in follow up for further evaluation. If she gets any side effects such as palpitations or muscle cramps she will notify our office. We will check a renal panel today  Bacterial vaginitis Patient c/o increased, smelly vaginal discharge, no lesions,pain, fevers associated. Occasional itching noted. Has tried an OTC antifungal cream without benefit. Is given Cleocin vag cream to apply qhs for 3 days now, then for another week after she finishes her antibiotic. She is asked to take a prophylactic daily. She is asked to cleanse the area daily with hot water but no so. Will try after cleansing. Cotton underwear and loose clothing. And she is asked to provide daily. If her symptoms do not resolve she needs to come in for a GYN exam for further testing  Cellulitis and abscess of leg Patient with 1 week history of a skin tear on her shin getting more and more tender. She has been cleaning it daily and covering it with antibiotic ointment but it is getting increasingly red as well. Will start some Cefdinir bid and she will continue current care, notify us if lesion worsens

## 2010-11-18 NOTE — Assessment & Plan Note (Signed)
Discussed at length with the patient the multifactorial nature of edema. She notes it in her hands as well as her feet at the end of the day but the feet are worse. B/L the same. Encouraged to minimize sodium, elevate feet above heart bili 15 minutes twice a day. Encouraged use of Jobst stockings to mercury and rhythm obese which is going to be on her feet for a long period of time on in a.m. off in p.m. She is also given Lasix due to the she can take 20 mg tablet once daily for the next week and then back off as needed. She is to weigh herself daily and anytime she gains more than 2-3 pounds overnight she will take 1 or with any edema she may take one occasionally. If this is not successful we will see her in follow up for further evaluation. If she gets any side effects such as palpitations or muscle cramps she will notify our office. We will check a renal panel today

## 2010-11-18 NOTE — Assessment & Plan Note (Signed)
Patient c/o increased, smelly vaginal discharge, no lesions,pain, fevers associated. Occasional itching noted. Has tried an OTC antifungal cream without benefit. Is given Cleocin vag cream to apply qhs for 3 days now, then for another week after she finishes her antibiotic. She is asked to take a prophylactic daily. She is asked to cleanse the area daily with hot water but no so. Will try after cleansing. Cotton underwear and loose clothing. And she is asked to provide daily. If her symptoms do not resolve she needs to come in for a GYN exam for further testing

## 2010-11-18 NOTE — Patient Instructions (Signed)
Edema Edema is an abnormal build-up of fluids in tissues. Because this is partly dependent on gravity (water flows to the lowest place), it is more common in the lower extremities (legs and thighs). It is also common in the looser tissues, like around the eyes. Painless swelling of the feet and ankles is common and increases as a person ages. It may affect both legs and may include the calves or even thighs. When squeezed, the fluid may move out of the affected area and may leave a dent for a few moments. CAUSES  Prolonged standing or sitting in one place for extended periods of time. Movement helps pump tissue fluid into the veins, and absence of movement prevents this, resulting in edema.   Varicose veins. The valves in the veins do not work as well as they should. This causes fluid to leak into the tissues.   Fluid and salt overload.   Injury, burn, or surgery to the leg, ankle, or foot, may damage veins and allow fluid to leak out.   Sunburn damages vessels. Leaky vessels allow fluid to go out into the sunburned tissues.   Allergies (from insect bites or stings, medications or chemicals) cause swelling by allowing vessels to become leaky.   Protein in the blood helps keep fluid in your vessels. Low protein, as in malnutrition, allows fluid to leak out.   Hormonal changes, including pregnancy and menstruation, cause fluid retention. This fluid may leak out of vessels and cause edema.   Medications that cause fluid retention. Examples are sex hormones, blood pressure medications, steroid treatment, or anti-depressants.   Some illnesses cause edema, especially heart failure, kidney disease, or liver disease.   Surgery that cuts veins or lymph nodes, such as surgery done for the heart or for breast cancer, may result in edema.  DIAGNOSIS Your caregiver is usually easily able to determine what is causing your swelling (edema) by simply asking what is wrong (getting a history) and examining  you (doing a physical). Sometimes x-rays, EKG (electrocardiogram or heart tracing), and blood work may be done to evaluate for underlying medical illness. TREATMENT General treatment includes:  Leg elevation (or elevation of the affected body part).   Restriction of fluid intake.   Prevention of fluid overload.   Compression of the affected body part. Compression with elastic bandages or support stockings squeezes the tissues, preventing fluid from entering and forcing it back into the blood vessels.   Diuretics (also called water pills or fluid pills) pull fluid out of your body in the form of increased urination. These are effective in reducing the swelling, but can have side effects and must be used only under your caregiver's supervision. Diuretics are appropriate only for some types of edema.  The specific treatment can be directed at any underlying causes discovered. Heart, liver, or kidney disease should be treated appropriately. HOME CARE INSTRUCTIONS  Elevate the legs (or affected body part) above the level of the heart, while lying down.   Avoid sitting or standing still for prolonged periods of time.   Avoid putting anything directly under the knees when lying down, and do not wear constricting clothing or garters on the upper legs.   Exercising the legs causes the fluid to work back into the veins and lymphatic channels. This may help the swelling go down.   The pressure applied by elastic bandages or support stockings can help reduce ankle swelling.   A low-salt diet may help reduce fluid retention and decrease the   ankle swelling.   Take any medications exactly as prescribed.  SEEK MEDICAL CARE IF:  Your edema is not responding to recommended treatments.  SEEK IMMEDIATE MEDICAL CARE IF:  You develop shortness of breath or chest pain.   You cannot breathe when you lay down; or if, while lying down, you have to get up and go to the window to get your breath.   You are  having increasing swelling without relief from treatment.   You develop a fever over 104.   You develop pain or redness in the areas that are swollen.   Tell your caregiver right away if you have gained 3 # in 1 day or 7# in a week.  MAKE SURE YOU:  Understand these instructions.   Will watch your condition.   Will get help right away if you are not doing well or get worse.  Document Released: 05/12/2005 Document Re-Released: 10/30/2009 [facility] Patient Information 2011 [place], [place].  Try the Compression hose on in am off in pm. [employer] is a good company just need the light weight stockings with 15 to 20 mmhg  Cellulitis Cellulitis is an infection of the skin and the tissue beneath it. The area is typically red and tender. It is caused by germs (bacteria) (usually staph or strep) that enter the body through cuts or sores. Cellulitis most commonly occurs in the arms or lower legs.  HOME CARE INSTRUCTIONS  If you are given a prescription for medications which kill germs (antibiotics), take as directed until finished.   If the infection is on the arm or leg, keep the limb elevated as able.   Use a warm cloth several times per day to relieve pain and encourage healing.   See your caregiver for recheck of the infected site in as needed or sooner if problems arise.   Only take over-the-counter or prescription medicines for pain, discomfort, or fever as directed by your caregiver.  SEEK MEDICAL CARE IF:  An oral temperature above 104 develops, not controlled by medication.   The area of redness (inflammation) is spreading, there are red streaks coming from the infected site, or if a part of the infection begins to turn dark in color.   The joint or bone underneath the infected skin becomes painful after the skin has healed.   The infection returns in the same or another area after it seems to have gone away.   A boil or bump swells up. This may be an abscess.   New, unexplained  problems such as pain or fever develop.  SEEK IMMEDIATE MEDICAL CARE IF:  You or your child feels drowsy or lethargic.   There is vomiting, diarrhea, or lasting discomfort or feeling ill (malaise) with muscle aches and pains.  MAKE SURE YOU:   Understand these instructions.   Will watch your condition.   Will get help right away if you are not doing well or get worse.  Document Released: 02/19/2005 Document Re-Released: 03/09/2009 [facility] Patient Information 2011 [place], [place].  Bacterial Vaginosis (BV) Bacterial vaginosis (BV) is a vaginal infection where the normal balance of bacteria in the vagina is disrupted. The normal balance is then replaced by an overgrowth of certain bacteria. There are several different kinds of bacteria that can cause BV. BV is the most common vaginal infection in women of childbearing age. CAUSES  The cause of BV is not fully understood. BV develops when there is an increase or imbalance of harmful bacteria.   Some activities or  behaviors can upset the normal balance of bacteria in the vagina and put women at increased risk including:   Having a new sex partner or multiple sex partners.   Douching.   Using an intrauterine device (IUD) for contraception.   It is not clear what role sexual activity plays in the development of BV. However, women that have never had sexual intercourse are rarely infected with BV.  Women do not get BV from toilet seats, bedding, swimming pools or from touching objects around them.  SYMPTOMS  Grey vaginal discharge.   A fish like odor with discharge, especially after sexual intercourse.   Itching or burning of the vagina and vulva.   Burning or pain with urination.   Some women have no signs or symptoms at all.  DIAGNOSIS  Your caregiver must examine the vagina for signs of BV. Your caregiver will perform lab tests and look at the sample of vaginal fluid through a microscope. They will look for bacteria and  abnormal cells (clue cells), a pH test higher than 4.5, and a positive amine test all associated with BV.  RISKS AND COMPLICATIONS  Pelvic inflammatory disease (PID).   Infections following gynecology surgery.   Developing HIV.   Developing herpes virus.  TREATMENT Sometimes BV will clear up without treatment. However, all women with symptoms of BV should be treated to avoid complications, especially if gynecology surgery is planned. Female partners generally do not need to be treated. However, BV may spread between female sex partners so treatment is helpful in preventing a recurrence of BV.   BV may be treated with medicines that kill germs (antibiotics). The antibiotics come in either pill or vaginal cream forms. Either can be used with non-pregnant or pregnant women, but the recommended dosages differ. These antibiotics are not harmful to the baby.   BV can recur after treatment. If this happens, a second course of antibiotics will often be prescribed.   Treatment is important for pregnant women. If not treated, BV can cause a premature delivery, especially for a pregnant woman who had a premature birth in the past. All pregnant women who have symptoms of BV should be checked and treated.   For chronic reoccurrence of BV, treatment with a type of prescribed gel vaginally twice a week is helpful.  HOME CARE INSTRUCTIONS  Finish all medication as directed by your caregiver.   Do not have sex until treatment is completed.   Tell your sexual partner that you have a vaginal infection. They should see their caregiver and be treated if they have problems, such as a mild rash or itching.   Practice safe sex. Use condoms. Only have one sex partner.  PREVENTION Basic prevention steps can help reduce the risk of upsetting the natural balance of bacteria in the vagina and developing BV:  Do not have sexual intercourse (be abstinent).   Do not douche.   Use all of the medicine prescribed for  treatment of BV, even if the signs and symptoms go away.   Tell your sex partner if you have BV. That way, they can be treated, if needed, to prevent reoccurrence.  SEEK MEDICAL CARE IF:  Your symptoms are not improving after three days of treatment.   You have increased discharge, pain or fever.  MAKE SURE YOU:   Understand these instructions.   Will watch your condition.   Will get help right away if you are not doing well or get worse.  FOR MORE INFORMATION Division  of STD Prevention (DSTDP) Centers for Disease Control and Prevention SolutionApps.co.za Order Publications Online at ChoiceTips.dk  [facility] Prevention Information Network (NPIN) E-mail: info@cdcnpin .org www.cdcnpin.org   [employer] (ASHA) Erskin Burnet. [address] [place], [place] [address] www.ashastd.org  Document Released: 05/12/2005 Document Re-Released: 08/06/2009 [facility] Patient Information 2011 [place], [place].

## 2010-11-19 ENCOUNTER — Encounter: Payer: Self-pay | Admitting: Family Medicine

## 2010-11-19 NOTE — Assessment & Plan Note (Signed)
Patient with 1 week history of a skin tear on her shin getting more and more tender. She has been cleaning it daily and covering it with antibiotic ointment but it is getting increasingly red as well. Will start some Cefdinir bid and she will continue current care, notify us if lesion worsens

## 2010-12-16 ENCOUNTER — Telehealth: Payer: Self-pay | Admitting: Gastroenterology

## 2010-12-16 ENCOUNTER — Other Ambulatory Visit (INDEPENDENT_AMBULATORY_CARE_PROVIDER_SITE_OTHER): Payer: PRIVATE HEALTH INSURANCE

## 2010-12-16 DIAGNOSIS — D649 Anemia, unspecified: Secondary | ICD-10-CM

## 2010-12-16 DIAGNOSIS — E663 Overweight: Secondary | ICD-10-CM

## 2010-12-16 LAB — TSH: TSH: 0.01 u[IU]/mL — ABNORMAL LOW (ref 0.35–5.50)

## 2010-12-16 LAB — CBC WITH DIFFERENTIAL/PLATELET
Basophils Absolute: 0 10*3/uL (ref 0.0–0.1)
Basophils Relative: 0.5 % (ref 0.0–3.0)
Eosinophils Absolute: 0.1 10*3/uL (ref 0.0–0.7)
Eosinophils Relative: 1.5 % (ref 0.0–5.0)
HCT: 28.6 % — ABNORMAL LOW (ref 36.0–46.0)
Hemoglobin: 9.8 g/dL — ABNORMAL LOW (ref 12.0–15.0)
Lymphocytes Relative: 11.5 % — ABNORMAL LOW (ref 12.0–46.0)
Lymphs Abs: 1.1 10*3/uL (ref 0.7–4.0)
MCHC: 34.4 g/dL (ref 30.0–36.0)
MCV: 87.9 fl (ref 78.0–100.0)
Monocytes Absolute: 0.6 10*3/uL (ref 0.1–1.0)
Monocytes Relative: 6.9 % (ref 3.0–12.0)
Neutro Abs: 7.3 10*3/uL (ref 1.4–7.7)
Neutrophils Relative %: 79.6 % — ABNORMAL HIGH (ref 43.0–77.0)
Platelets: 228 10*3/uL (ref 150.0–400.0)
RBC: 3.25 Mil/uL — ABNORMAL LOW (ref 3.87–5.11)
RDW: 13 % (ref 11.5–14.6)
WBC: 9.2 10*3/uL (ref 4.5–10.5)

## 2010-12-16 LAB — LDL CHOLESTEROL, DIRECT: Direct LDL: 85.1 mg/dL

## 2010-12-16 LAB — RENAL FUNCTION PANEL
Albumin: 4.2 g/dL (ref 3.5–5.2)
BUN: 27 mg/dL — ABNORMAL HIGH (ref 6–23)
CO2: 26 mEq/L (ref 19–32)
Calcium: 8.8 mg/dL (ref 8.4–10.5)
Chloride: 103 mEq/L (ref 96–112)
Creatinine, Ser: 1 mg/dL (ref 0.4–1.2)
GFR: 59.42 mL/min — ABNORMAL LOW (ref >60.00)
Glucose, Bld: 104 mg/dL — ABNORMAL HIGH (ref 70–99)
Phosphorus: 3 mg/dL (ref 2.3–4.6)
Potassium: 4.1 mEq/L (ref 3.5–5.1)
Sodium: 138 mEq/L (ref 135–145)

## 2010-12-16 LAB — HEPATIC FUNCTION PANEL
ALT: 24 U/L (ref 0–35)
AST: 27 U/L (ref 0–37)
Albumin: 4.2 g/dL (ref 3.5–5.2)
Alkaline Phosphatase: 23 U/L — ABNORMAL LOW (ref 39–117)
Bilirubin, Direct: 0 mg/dL (ref 0.0–0.3)
Total Bilirubin: 0.6 mg/dL (ref 0.3–1.2)
Total Protein: 7.2 g/dL (ref 6.0–8.3)

## 2010-12-16 LAB — LIPID PANEL
Cholesterol: 169 mg/dL (ref 0–200)
HDL: 43.4 mg/dL (ref >39.00)
Total CHOL/HDL Ratio: 4
Triglycerides: 244 mg/dL — ABNORMAL HIGH (ref 0.0–149.0)
VLDL: 48.8 mg/dL — ABNORMAL HIGH (ref 0.0–40.0)

## 2010-12-17 ENCOUNTER — Encounter: Payer: Self-pay | Admitting: Family Medicine

## 2010-12-17 ENCOUNTER — Ambulatory Visit (INDEPENDENT_AMBULATORY_CARE_PROVIDER_SITE_OTHER): Payer: PRIVATE HEALTH INSURANCE | Admitting: Family Medicine

## 2010-12-17 VITALS — BP 136/71 | HR 69 | Temp 98.5°F | Ht 67.5 in | Wt 216.4 lb

## 2010-12-17 DIAGNOSIS — R1024 Suprapubic pain: Secondary | ICD-10-CM

## 2010-12-17 DIAGNOSIS — Z954 Presence of other heart-valve replacement: Secondary | ICD-10-CM

## 2010-12-17 DIAGNOSIS — R319 Hematuria, unspecified: Secondary | ICD-10-CM

## 2010-12-17 DIAGNOSIS — R102 Pelvic and perineal pain: Secondary | ICD-10-CM

## 2010-12-17 DIAGNOSIS — K5732 Diverticulitis of large intestine without perforation or abscess without bleeding: Secondary | ICD-10-CM

## 2010-12-17 DIAGNOSIS — R109 Unspecified abdominal pain: Secondary | ICD-10-CM

## 2010-12-17 DIAGNOSIS — D649 Anemia, unspecified: Secondary | ICD-10-CM

## 2010-12-17 DIAGNOSIS — E0590 Thyrotoxicosis, unspecified without thyrotoxic crisis or storm: Secondary | ICD-10-CM

## 2010-12-17 DIAGNOSIS — K5792 Diverticulitis of intestine, part unspecified, without perforation or abscess without bleeding: Secondary | ICD-10-CM

## 2010-12-17 LAB — CBC WITH DIFFERENTIAL/PLATELET
Basophils Absolute: 0.1 10*3/uL (ref 0.0–0.1)
Basophils Relative: 1 % (ref 0–1)
Eosinophils Absolute: 0.3 10*3/uL (ref 0.0–0.7)
Eosinophils Relative: 4 % (ref 0–5)
HCT: 29.1 % — ABNORMAL LOW (ref 36.0–46.0)
Hemoglobin: 9.7 g/dL — ABNORMAL LOW (ref 12.0–15.0)
Lymphocytes Relative: 21 % (ref 12–46)
Lymphs Abs: 1.5 10*3/uL (ref 0.7–4.0)
MCH: 29.3 pg (ref 26.0–34.0)
MCHC: 33.3 g/dL (ref 30.0–36.0)
MCV: 87.9 fL (ref 78.0–100.0)
Monocytes Absolute: 0.5 10*3/uL (ref 0.1–1.0)
Monocytes Relative: 7 % (ref 3–12)
Neutro Abs: 4.7 10*3/uL (ref 1.7–7.7)
Neutrophils Relative %: 67 % (ref 43–77)
Platelets: 247 10*3/uL (ref 150–400)
RBC: 3.31 MIL/uL — ABNORMAL LOW (ref 3.87–5.11)
RDW: 13.2 % (ref 11.5–15.5)
WBC: 7.1 10*3/uL (ref 4.0–10.5)

## 2010-12-17 LAB — POCT URINALYSIS DIPSTICK
Glucose, UA: NEGATIVE
Ketones, UA: NEGATIVE
Nitrite, UA: NEGATIVE
Protein, UA: NEGATIVE
Spec Grav, UA: 1.025
Urobilinogen, UA: 0.2
pH, UA: 5

## 2010-12-17 MED ORDER — CIPROFLOXACIN HCL 500 MG PO TABS: 500.0000 mg | ORAL_TABLET | Freq: Two times a day (BID) | ORAL | Status: DC

## 2010-12-17 MED ORDER — FERROUS FUMARATE-FOLIC ACID 324-1 MG PO TABS: 1.0000 | ORAL_TABLET | Freq: Every day | ORAL | Status: DC

## 2010-12-17 MED ORDER — ACETAMINOPHEN-CODEINE 300-30 MG PO TABS: 1.0000 | ORAL_TABLET | Freq: Four times a day (QID) | ORAL | Status: DC | PRN

## 2010-12-17 MED ORDER — METRONIDAZOLE 500 MG PO TABS: 500.0000 mg | ORAL_TABLET | Freq: Three times a day (TID) | ORAL | Status: AC

## 2010-12-17 NOTE — Telephone Encounter (Signed)
I tried to call the pt back but when I tried to leave a message it kept telling me that a message was not able to be left.  I will try again later

## 2010-12-17 NOTE — Patient Instructions (Signed)
Diverticulitis A diverticulum is a small pouch or sac on the colon. Diverticulosis is the presence of these diverticula on the colon. Diverticulitis is the irritation (inflammation) or infection of diverticula. CAUSES The colon and its diverticula contain germs (bacteria). If food particles block the tiny opening to a diverticulum, the bacteria inside can grow and cause an increase in pressure. This leads to infection and inflammation. SYMPTOMS  Belly (abdominal) pain and tenderness. Usually, the pain is located on the left side of your abdomen. However, it could be located elsewhere.   Fever.   Bloating.   Feeling sick to your stomach (nausea).   Throwing up (vomiting).   Abnormal stools.  DIAGNOSIS Your caregiver will take a history and perform a physical exam. Since many things can cause abdominal pain, other tests may be necessary. Tests may include:  Blood tests.  Urine tests.   X-ray of the abdomen.  CT scan of the abdomen.   Sometimes, surgery is needed to determine if diverticulitis or other conditions are causing your symptoms. TREATMENT Most of the time, you can be treated without surgery. Treatment includes:  Resting the bowels by only having liquids for a few days.   Intravenous (IV) fluids if you are losing fluids (dehydrated).   Medicines (antibiotics) that kill germs may be given.   Pain and nausea medicine, if needed.   As you improve, eating soft, easily digestible foods.   Surgery if the inflamed diverticulum has burst.  HOME CARE INSTRUCTIONS  Take all medicine as directed by your caregiver.   Try a clear liquid diet (broth, tea, or water for 24 hours, or as directed by your caregiver). You may then gradually begin a bland diet as tolerated.   You may be put on a high-fiber diet. Avoid nuts and seeds. Follow your caregiver's diet recommendations.   If your caregiver has given you a follow-up appointment, it is very important that you go. Not going  could result in lasting (chronic) or permanent injury, pain, and disability. If there is any problem keeping the appointment, call to reschedule.  SEEK MEDICAL CARE IF:  Pain does not improve.   You have a hard time advancing your diet beyond clear liquids.   Bowel movements do not return to normal.  SEEK IMMEDIATE MEDICAL CARE IF:  The pain becomes worse.   You have an oral temperature above 104, not controlled by medicine.   Repeated vomiting occurs.   You have bloody or black, tarry stools.   Symptoms that brought you to your caregiver become worse or are not getting better.  MAKE SURE YOU:  Understand these instructions.   Will watch your condition.   Will get help right away if you are not doing well or get worse.  Document Released: 02/19/2005 Document Re-Released: 08/06/2009 [facility] Patient Information 2011 [place], [place].

## 2010-12-18 ENCOUNTER — Telehealth: Payer: Self-pay

## 2010-12-18 ENCOUNTER — Encounter: Payer: Self-pay | Admitting: Family Medicine

## 2010-12-18 DIAGNOSIS — R319 Hematuria, unspecified: Secondary | ICD-10-CM | POA: Insufficient documentation

## 2010-12-18 DIAGNOSIS — E0590 Thyrotoxicosis, unspecified without thyrotoxic crisis or storm: Secondary | ICD-10-CM

## 2010-12-18 DIAGNOSIS — K5792 Diverticulitis of intestine, part unspecified, without perforation or abscess without bleeding: Secondary | ICD-10-CM

## 2010-12-18 HISTORY — DX: Diverticulitis of intestine, part unspecified, without perforation or abscess without bleeding: K57.92

## 2010-12-18 HISTORY — DX: Thyrotoxicosis, unspecified without thyrotoxic crisis or storm: E05.90

## 2010-12-18 MED ORDER — PANTOPRAZOLE SODIUM 40 MG PO TBEC: 40.0000 mg | DELAYED_RELEASE_TABLET | Freq: Every day | ORAL | Status: DC

## 2010-12-18 NOTE — Assessment & Plan Note (Signed)
Symptoms suggestive of an early diverticular flare, is started on Cipro and Flagyl and she will call if any symptoms worsen

## 2010-12-18 NOTE — Assessment & Plan Note (Signed)
Patient has appt with her cardiologist tomorrow. Will continue Coumadin and continue to monitor

## 2010-12-18 NOTE — Progress Notes (Signed)
DEISHA STULL [id number] [birth year] 12/18/2010      Progress Note-Follow Up  Subjective  Chief Complaint  Chief Complaint  Patient presents with  . Follow-up    weak, tired    HPI  Patient is a 60 year old Caucasian female who is in at our request. On reviewing her labs it was noted that her anemia was starting to worsen again then we'll reviewing her chart it was noted that she call GI complaining of increasing abdominal pain so we called her. She has noted a 2 to three-day history of worsening of lower abdominal pain and some mild dyspepsia. Denies fevers chills. Denies dysuria or urinary frequency. She does acknowledge some fatigue. No chest pain, palpitations, shortness of breath, GI complaints. She continues on all of her routine meds although on questioning she is unclear she's taken her iron medication recently for not. She does have an appointment tomorrow with cardiology.  Past Medical History  Diagnosis Date  . Anemia   . Anxiety   . Depression   . Diverticulosis   . Elevated cholesterol   . Obesity   . THYROMEGALY 07/22/2010  . Overweight 03/11/2010  . Mixed hyperlipidemia 03/11/2010  . MEASLES, HX OF 03/11/2010  . KNEE PAIN, RIGHT 05/28/2010  . HYPERTRIGLYCERIDEMIA 03/11/2010  . HEART MURMUR, HX OF 03/11/2010  . FIBROIDS, UTERUS 03/11/2010  . Diarrhea 08/05/2010  . CONSTIPATION 03/11/2010  . CHICKENPOX, HX OF 03/11/2010  . BIPOLAR AFFECTIVE DISORDER 03/11/2010  . AORTIC VALVE REPLACEMENT, HX OF 03/11/2010  . ANEMIA 05/28/2010  . Acute bronchitis 07/22/2010  . Abdominal pain, generalized 03/19/2010  . Peripheral edema 11/18/2010  . Bacterial vaginitis 11/18/2010  . Hematuria 12/18/2010  . Diverticulitis 12/18/2010    Past Surgical History  Procedure Date  . Appendectomy     Required revision for EColi infection, required recurrent packing  . Bowel obstruction     Requiring adhesions to be removed  . Abdominal hysterectomy     sb/l spo, total  . Aortic valve  replacement   . Open heart with a cardiac aneurysm repair   . Cholecystectomy   . Tubal ligation   . Varicose vein surgery b/l legs   . Childhood exploratory surgery of heart   . Hernia repair 08-16-10    Family History  Problem Relation Age of Onset  . Scoliosis Mother   . Arthritis Mother     Rheumatoid  . Aneurysm Mother     heart  . Alcohol abuse Father   . Depression Sister   . Depression Brother   . Alcohol abuse Brother   . Cancer Maternal Grandmother     colon  . Diabetes Maternal Grandfather   . Heart disease Maternal Grandfather   . Alcohol abuse Maternal Grandfather   . Depression Paternal Grandmother   . Diabetes Paternal Grandmother   . Depression Paternal Grandfather   . Diabetes Paternal Grandfather   . Depression Sister   . Alcohol abuse Brother   . Depression Brother   . Heart disease Brother     History   Social History  . Marital Status: Married    Spouse Name: N/A    Number of Children: N/A  . Years of Education: N/A   Occupational History  . Not on file.   Social History Main Topics  . Smoking status: Former Smoker    Quit date: 05/26/1990  . Smokeless tobacco: Never Used  . Alcohol Use: 0.0 oz/week    0 drink(s) per week  very serious occasions  . Drug Use: No  . Sexually Active: No   Other Topics Concern  . Not on file   Social History Narrative  . No narrative on file    Current Outpatient Prescriptions on File Prior to Visit  Medication Sig Dispense Refill  . acetaminophen (TYLENOL) 325 MG tablet Take 650 mg by mouth every 6 (six) hours as needed.        Marland Kitchen amiodarone (PACERONE) 200 MG tablet Take 200 mg by mouth daily. 1/2 tab       . aspirin 81 MG tablet Take 81 mg by mouth daily.        . clonazePAM (KLONOPIN) 0.5 MG tablet Take 0.5 mg by mouth 2 (two) times daily. 1/2 tab       . DULoxetine (CYMBALTA) 30 MG capsule Take 30 mg by mouth daily.        . fenofibrate 160 MG tablet Take 160 mg by mouth daily.        .  furosemide (LASIX) 20 MG tablet Take 1 tablet (20 mg total) by mouth daily.  30 tablet  2  . Hyoscyamine Sulfate 0.125 MG TBDP Place under the tongue every 4 (four) hours as needed. Every 2-4 hours for abdominal pain      . lamoTRIgine (LAMICTAL) 150 MG tablet Take 150 mg by mouth daily. 1/2 tab       . lisinopril-hydrochlorothiazide (PRINZIDE,ZESTORETIC) 10-12.5 MG per tablet Take 1 tablet by mouth daily.        Marland Kitchen lubiprostone (AMITIZA) 24 MCG capsule Take 24 mcg by mouth 2 (two) times daily with meals.        . pravastatin (PRAVACHOL) 40 MG tablet Take 40 mg by mouth at bedtime.        . Probiotic Product (ALIGN) 4 MG CAPS Take 1 capsule by mouth daily.  30 capsule  0  . QUEtiapine (SEROQUEL) 200 MG tablet Take 200 mg by mouth at bedtime. 1-2 tab       . scopolamine (TRANSDERM-SCOP) 1.5 MG Place 1 patch (1.5 mg total) onto the skin every 3 (three) days.  4 patch  0  . senna (SENOKOT) 8.6 MG tablet Take 1 tablet by mouth at bedtime. Five tablets       . SINGULAIR 10 MG tablet TAKE 1 TABLET EVERY DAY  30 tablet  1  . warfarin (COUMADIN) 5 MG tablet Take 5 mg by mouth daily. 1/2 to 1 tab         Allergies  Allergen Reactions  . Sulfonamide Derivatives     Review of Systems  Review of Systems  Constitutional: Negative for fever, chills and malaise/fatigue.  HENT: Negative for congestion.   Eyes: Negative for discharge.  Respiratory: Negative for shortness of breath.   Cardiovascular: Negative for chest pain, palpitations and leg swelling.  Gastrointestinal: Positive for abdominal pain. Negative for nausea, vomiting, diarrhea, constipation and blood in stool.       Has been traveling, drove to [place] and back and 2-3 days ago had an increase in difficulty stooling and increase in lower abdominal cramping  Genitourinary: Negative for dysuria, urgency, frequency and hematuria.  Musculoskeletal: Negative for falls.  Skin: Negative for rash.  Neurological: Negative for loss of  consciousness and headaches.  Endo/Heme/Allergies: Negative for polydipsia.  Psychiatric/Behavioral: Negative for depression and suicidal ideas. The patient is not nervous/anxious and does not have insomnia.     Objective  BP 136/71  Pulse 69  Temp(Src) 98.5  F (36.9 C) (Oral)  Ht 5' 7.5" (1.715 m)  Wt 216 lb 6.4 oz (98.158 kg)  BMI 33.39 kg/m2  SpO2 94%  Physical Exam  Physical Exam  Constitutional: She is oriented to person, place, and time and well-developed, well-nourished, and in no distress. No distress.  HENT:  Head: Normocephalic and atraumatic.  Eyes: Conjunctivae are normal.  Neck: Neck supple. No thyromegaly present.  Cardiovascular: Normal rate, regular rhythm and normal heart sounds.   No murmur heard. Pulmonary/Chest: Effort normal and breath sounds normal. She has no wheezes.  Abdominal: Soft. Bowel sounds are normal. She exhibits no distension and no mass. There is tenderness. There is rebound. There is no guarding.       Tender b/l lower quadrants but most tender in suprapubic region specifically  Musculoskeletal: She exhibits no edema.  Lymphadenopathy:    She has no cervical adenopathy.  Neurological: She is alert and oriented to person, place, and time.  Skin: Skin is warm and dry. No rash noted. She is not diaphoretic.  Psychiatric: Memory, affect and judgment normal.    Lab Results  Component Value Date   TSH 0.01 Repeated and verified X2.* 12/16/2010   Lab Results  Component Value Date   WBC 7.1 12/17/2010   HGB 9.7* 12/17/2010   HCT 29.1* 12/17/2010   MCV 87.9 12/17/2010   PLT 247 12/17/2010   Lab Results  Component Value Date   CREATININE 1.0 12/16/2010   BUN 27* 12/16/2010   NA 138 12/16/2010   K 4.1 12/16/2010   CL 103 12/16/2010   CO2 26 12/16/2010   Lab Results  Component Value Date   ALT 24 12/16/2010   AST 27 12/16/2010   ALKPHOS 23* 12/16/2010   BILITOT 0.6 12/16/2010   Lab Results  Component Value Date   CHOL 169 12/16/2010   Lab  Results  Component Value Date   HDL 43.40 12/16/2010   No results found for this basename: [facility]   Lab Results  Component Value Date   TRIG 244.0* 12/16/2010   Lab Results  Component Value Date   CHOLHDL 4 12/16/2010     Assessment & Plan  ANEMIA The patient has a long history of anemia on recent lab shown to be more anemic once again. She is unclear she's taking her iron not. She is asked omentectomy sure she gets. She is Hemoccult positive elbow there is no frank blood. Stool is brown on rectal exam. We will start her on pantoprazole 40 mg by mouth daily for GI prophylaxis and recheck her CBC in 2 days time. She will seek immediate medical care she has increased and now her stool patterns, any black or tarry stool or any frank red blood per rectum. She expresses understanding.  Hematuria Urine sent for culture, increase clear fluid intake  Diverticulitis Symptoms suggestive of an early diverticular flare, is started on Cipro and Flagyl and she will call if any symptoms worsen  AORTIC VALVE REPLACEMENT, HX OF Patient has appt with her cardiologist tomorrow. Will continue Coumadin and continue to monitor  Hyperthyroidism Suppressed TSH, will check a free T4 with repeat labs and refer to endocrinology and Thyroid US

## 2010-12-18 NOTE — Telephone Encounter (Signed)
appt made

## 2010-12-18 NOTE — Assessment & Plan Note (Signed)
Urine sent for culture, increase clear fluid intake

## 2010-12-18 NOTE — Telephone Encounter (Signed)
Message copied by Court Joy on Wed Dec 18, 2010  3:23 PM ------      Message from: Danise Edge A      Created: Wed Dec 18, 2010  2:45 PM       Notify her her TSH was also very suppressed, I will add a free T4 to her labs tomorrow and have referred her to endocrinology and ordered a thyroid US

## 2010-12-18 NOTE — Telephone Encounter (Signed)
i think rov with me is best next step

## 2010-12-18 NOTE — Assessment & Plan Note (Signed)
The patient has a long history of anemia on recent lab shown to be more anemic once again. She is unclear she's taking her iron not. She is asked omentectomy sure she gets. She is Hemoccult positive elbow there is no frank blood. Stool is brown on rectal exam. We will start her on pantoprazole 40 mg by mouth daily for GI prophylaxis and recheck her CBC in 2 days time. She will seek immediate medical care she has increased and now her stool patterns, any black or tarry stool or any frank red blood per rectum. She expresses understanding.

## 2010-12-18 NOTE — Telephone Encounter (Signed)
Pt had Dr appt at PCP Reuel [facility])  yesterday her labs showed "bleeding" and anemia.  She is having pain lower abd pain, she has not seen any bleeding. Her PCP found heme pos stool.  Very tired and fatigue.  HgB was 9.7.  Reuel Derby is treating the pt for IBS with antibiotic and she is following up with her tomorrow for additional labs.  Had hernia repair in March 2012.  Her colon is not due until 2018.  Dr Christella Hartigan do you want to wait and see if the antibiotic helps or see the pt in the office?

## 2010-12-18 NOTE — Telephone Encounter (Signed)
Left message on machine to call back  

## 2010-12-18 NOTE — Assessment & Plan Note (Addendum)
Suppressed TSH, will check a free T4 with repeat labs and refer to endocrinology and Thyroid US

## 2010-12-19 ENCOUNTER — Ambulatory Visit (INDEPENDENT_AMBULATORY_CARE_PROVIDER_SITE_OTHER): Payer: PRIVATE HEALTH INSURANCE | Admitting: Family Medicine

## 2010-12-19 ENCOUNTER — Other Ambulatory Visit (INDEPENDENT_AMBULATORY_CARE_PROVIDER_SITE_OTHER): Payer: PRIVATE HEALTH INSURANCE

## 2010-12-19 ENCOUNTER — Telehealth: Payer: Self-pay

## 2010-12-19 ENCOUNTER — Encounter: Payer: Self-pay | Admitting: Family Medicine

## 2010-12-19 DIAGNOSIS — D649 Anemia, unspecified: Secondary | ICD-10-CM

## 2010-12-19 DIAGNOSIS — E0590 Thyrotoxicosis, unspecified without thyrotoxic crisis or storm: Secondary | ICD-10-CM

## 2010-12-19 DIAGNOSIS — K5732 Diverticulitis of large intestine without perforation or abscess without bleeding: Secondary | ICD-10-CM

## 2010-12-19 DIAGNOSIS — R319 Hematuria, unspecified: Secondary | ICD-10-CM

## 2010-12-19 DIAGNOSIS — K5792 Diverticulitis of intestine, part unspecified, without perforation or abscess without bleeding: Secondary | ICD-10-CM

## 2010-12-19 LAB — POCT URINALYSIS DIPSTICK
Bilirubin, UA: NEGATIVE
Blood, UA: NEGATIVE
Glucose, UA: NEGATIVE
Ketones, UA: NEGATIVE
Nitrite, UA: NEGATIVE
Protein, UA: NEGATIVE
Spec Grav, UA: 1.02
Urobilinogen, UA: 0.2
pH, UA: 5

## 2010-12-19 LAB — CBC WITH DIFFERENTIAL/PLATELET
Basophils Absolute: 0 10*3/uL (ref 0.0–0.1)
Basophils Relative: 1 % (ref 0.0–3.0)
Eosinophils Absolute: 0.3 10*3/uL (ref 0.0–0.7)
Eosinophils Relative: 5.9 % — ABNORMAL HIGH (ref 0.0–5.0)
HCT: 29.2 % — ABNORMAL LOW (ref 36.0–46.0)
Hemoglobin: 10.1 g/dL — ABNORMAL LOW (ref 12.0–15.0)
Lymphocytes Relative: 26.1 % (ref 12.0–46.0)
Lymphs Abs: 1.2 10*3/uL (ref 0.7–4.0)
MCHC: 34.4 g/dL (ref 30.0–36.0)
MCV: 87.9 fl (ref 78.0–100.0)
Monocytes Absolute: 0.5 10*3/uL (ref 0.1–1.0)
Monocytes Relative: 10.5 % (ref 3.0–12.0)
Neutro Abs: 2.5 10*3/uL (ref 1.4–7.7)
Neutrophils Relative %: 56.5 % (ref 43.0–77.0)
Platelets: 250 10*3/uL (ref 150.0–400.0)
RBC: 3.32 Mil/uL — ABNORMAL LOW (ref 3.87–5.11)
RDW: 13.1 % (ref 11.5–14.6)
WBC: 4.5 10*3/uL (ref 4.5–10.5)

## 2010-12-19 LAB — T4, FREE: Free T4: 3.25 ng/dL — ABNORMAL HIGH (ref 0.60–1.60)

## 2010-12-19 MED ORDER — FERROUS FUMARATE-FOLIC ACID 324-1 MG PO TABS: 1.0000 | ORAL_TABLET | Freq: Every day | ORAL | Status: DC

## 2010-12-19 NOTE — Patient Instructions (Signed)
Anemia - Nonspecific Your exam and blood tests show you are anemic. This means your blood (hemoglobin) level is low. Normal hemoglobin values are 12-15 for females and 14-17 for males. Make a note of your hemoglobin level today. The hematocrit percent is also used to measure anemia. A normal hematocrit is 38-46 in females and 42-49 in males. Make a note of your hematocrit level today. SYMPTOMS Anemia can come on suddenly (acute). It can also come on slowly (chronic). Symptoms can include:  Minor weakness.   Dizziness.   Palpitations.  Shortness of breath.   Symptoms may be absent until half your hemoglobin is missing if it comes on slowly. Anemia due to acute blood loss from an injury or internal bleeding may require blood transfusion if the loss is severe. Hospital care is needed if you are anemic and there is significant continued blood loss. CAUSES Anemia can be due to many different causes.  Excessive bleeding from periods is a common problem in women.  Other causes can include:  Intestinal bleeding.   Poor nutrition.   Kidney, thyroid, liver, and bone marrow diseases.  TREATMENT  Stool tests for blood (Hemoccult) and additional lab tests are often needed. This determines the best treatment.   Further checking on your condition and your response to treatment is very important. It often takes many weeks to correct anemia.  Depending on the cause, treatment can include:  Supplements of iron.   Vitamins B12 and folic acid.   Hormone medicines.  If your anemia is due to bleeding, finding the cause of the blood loss is very important. This will help avoid further problems. SEEK IMMEDIATE MEDICAL CARE IF:  You develop fainting, extreme weakness, shortness of breath, or chest pain.   You develop heavy vaginal bleeding.   You develop bloody or black, tarry stools or vomit up blood.   You develop a high fever, rash, repeated vomiting, or dehydration.  Document Released:  06/19/2004 Document Re-Released: 10/30/2009 [employer] Patient Information 2011 [employer]. 

## 2010-12-19 NOTE — Progress Notes (Signed)
Addended by: Baldemar Lenis R on: 12/19/2010 10:44 AM   Modules accepted: Orders

## 2010-12-20 ENCOUNTER — Ambulatory Visit (HOSPITAL_COMMUNITY)
Admission: RE | Admit: 2010-12-20 | Discharge: 2010-12-20 | Disposition: A | Discharge status: Home / Self Care | Payer: PRIVATE HEALTH INSURANCE | Source: Ambulatory Visit | Attending: Family Medicine | Admitting: Family Medicine

## 2010-12-20 ENCOUNTER — Telehealth: Payer: Self-pay

## 2010-12-20 DIAGNOSIS — E0590 Thyrotoxicosis, unspecified without thyrotoxic crisis or storm: Secondary | ICD-10-CM | POA: Insufficient documentation

## 2010-12-20 NOTE — Telephone Encounter (Signed)
Patient left a message stating that when she brushed her teeth this morning, she cleared her throat and had a little bit of blood in it? Patient states she feels really weak this morning but is getting ready to get her blood rechecked (coumadin level) and get a thyroid ultrasound at 11 this morning. Ok to leave a message on pts voicemail.

## 2010-12-20 NOTE — Telephone Encounter (Signed)
FYI: Patient states her cardiologist just called and told her that her INR was 2.8 and should skip coumadin today and take 1/2 pill here on out till Tuesday?

## 2010-12-20 NOTE — Telephone Encounter (Signed)
Pt informed and will call back and let us know what her INR was

## 2010-12-20 NOTE — Telephone Encounter (Signed)
I reviewed her chart.  Reassure patient that a bit of bleeding with brushing teeth can be normal when on coumadin.  Sounds like fatigue is not a new issue for her.  Hb's stable as recently as 12/19/10.  Past INRs subtherapeutic, although the most recent I can see in EPIC is from 07/2010. We need to f/u the INR she gets today.  Continue with current plan of care.

## 2010-12-21 LAB — URINE CULTURE
Colony Count: NO GROWTH
Organism ID, Bacteria: NO GROWTH

## 2010-12-23 ENCOUNTER — Ambulatory Visit: Payer: PRIVATE HEALTH INSURANCE | Admitting: Family Medicine

## 2010-12-24 ENCOUNTER — Encounter: Payer: Self-pay | Admitting: Family Medicine

## 2010-12-24 ENCOUNTER — Ambulatory Visit (INDEPENDENT_AMBULATORY_CARE_PROVIDER_SITE_OTHER): Payer: PRIVATE HEALTH INSURANCE | Admitting: Family Medicine

## 2010-12-24 ENCOUNTER — Ambulatory Visit: Payer: PRIVATE HEALTH INSURANCE | Admitting: Family Medicine

## 2010-12-24 DIAGNOSIS — D649 Anemia, unspecified: Secondary | ICD-10-CM

## 2010-12-24 DIAGNOSIS — R109 Unspecified abdominal pain: Secondary | ICD-10-CM

## 2010-12-24 DIAGNOSIS — K5792 Diverticulitis of intestine, part unspecified, without perforation or abscess without bleeding: Secondary | ICD-10-CM

## 2010-12-24 DIAGNOSIS — R609 Edema, unspecified: Secondary | ICD-10-CM

## 2010-12-24 DIAGNOSIS — K5732 Diverticulitis of large intestine without perforation or abscess without bleeding: Secondary | ICD-10-CM

## 2010-12-24 DIAGNOSIS — K625 Hemorrhage of anus and rectum: Secondary | ICD-10-CM

## 2010-12-24 DIAGNOSIS — F319 Bipolar disorder, unspecified: Secondary | ICD-10-CM

## 2010-12-24 DIAGNOSIS — E0590 Thyrotoxicosis, unspecified without thyrotoxic crisis or storm: Secondary | ICD-10-CM

## 2010-12-24 DIAGNOSIS — R197 Diarrhea, unspecified: Secondary | ICD-10-CM

## 2010-12-24 DIAGNOSIS — I10 Essential (primary) hypertension: Secondary | ICD-10-CM

## 2010-12-24 LAB — RENAL FUNCTION PANEL
Albumin: 4.1 g/dL (ref 3.5–5.2)
BUN: 38 mg/dL — ABNORMAL HIGH (ref 6–23)
CO2: 18 mEq/L — ABNORMAL LOW (ref 19–32)
Calcium: 9.3 mg/dL (ref 8.4–10.5)
Chloride: 105 mEq/L (ref 96–112)
Creat: 1.83 mg/dL — ABNORMAL HIGH (ref 0.50–1.10)
Glucose, Bld: 132 mg/dL — ABNORMAL HIGH (ref 70–99)
Phosphorus: 4.2 mg/dL (ref 2.3–4.6)
Potassium: 4.4 mEq/L (ref 3.5–5.3)
Sodium: 141 mEq/L (ref 135–145)

## 2010-12-24 LAB — CBC
HCT: 29.3 % — ABNORMAL LOW (ref 36.0–46.0)
Hemoglobin: 9.7 g/dL — ABNORMAL LOW (ref 12.0–15.0)
MCH: 28.9 pg (ref 26.0–34.0)
MCHC: 33.1 g/dL (ref 30.0–36.0)
MCV: 87.2 fL (ref 78.0–100.0)
Platelets: 290 10*3/uL (ref 150–400)
RBC: 3.36 MIL/uL — ABNORMAL LOW (ref 3.87–5.11)
RDW: 13.2 % (ref 11.5–15.5)
WBC: 4.7 10*3/uL (ref 4.0–10.5)

## 2010-12-24 MED ORDER — HYDROCORTISONE ACETATE 25 MG RE SUPP: RECTAL | Status: DC

## 2010-12-24 MED ORDER — LACTINEX PO CHEW: 2.0000 | CHEWABLE_TABLET | Freq: Three times a day (TID) | ORAL | Status: DC

## 2010-12-24 NOTE — Patient Instructions (Signed)
Diarrhea Diarrhea can be caused by many conditions. The most common cause is a virus.  Other causes include:  Food poisoning.   Bacterial infection.   Reactions to medicine (especially antibiotics and antacids).  Most of the time diarrhea improves after 2-3 days of rest and oral fluid replacement.  Drink enough clear fluids (water, sodas, Gatorade) to prevent dehydration. Adults must drink at least 2-3 quarts daily. Solid foods and dairy products should be avoided until you improve. Then begin with bland foods such as bananas, rice, applesauce, dry toast, crackers or other starches. Avoid spicy or fatty foods, caffeine and alcohol for several days. Medicine to control cramping and diarrhea may be helpful. If you have a fever or blood in the stool, avoid these medicines. They may prolong your illness. Antibiotics can speed recovery from diarrhea due to some bacterial infections, but may cause complications.  SEEK MEDICAL CARE IF:  Your diarrhea does not get better in 3 days.   You have fever.   You have blood in the stool.   You develop vomiting.   You become more dehydrated.  Document Released: 05/12/2005 Document Re-Released: 11/23/2006 [facility] Patient Information 2011 [place], [place].  Do not take any Lisinopril hct or Furosemide/Lasix until seen again later this week

## 2010-12-24 NOTE — Assessment & Plan Note (Signed)
He is having some suprapubic discomfort and with this urinalysis is round but urine culture comes back negative. We'll continue to monitor.

## 2010-12-24 NOTE — Assessment & Plan Note (Signed)
Seen by her psychiatrist this am, no change to meds, her mother is in the hospital in [place] right now very ill, she is in need of cardiac surgery but they are not sure she will survive the surgery so they have not proceeded thus far, this is contributing to her stressors

## 2010-12-24 NOTE — Assessment & Plan Note (Signed)
Patient now hypovolemic due to diarrhea, will hold her Furosemide and Lisinopril hct for next couple of days and recheck bp in 3 days or as needed

## 2010-12-24 NOTE — Progress Notes (Signed)
[name] [id number] 11-03-1950 12/24/2010      Progress Note-Follow Up  Subjective  Chief Complaint  Chief Complaint  Patient presents with  . Follow-up    follow up    HPI  Patient was feeling better somewhat til today. Her energy level had been increasing and her abdominal pain, while still present had diminished. Unfortunately she has had an increase in BMs to 5 loose stool daily resulting in increase burning sensation in b/l LQ and rectal irritation and even some rectal bleeding on the tissue when wiping due to the irritation. No f/c/n/v/anorexia/CP/palp/SOB/GU c/o. She feels tired and weak but notes she has had 3 doctors appts just today with [facility], psych and [place] and acknowledges running around a lot  Past Medical History  Diagnosis Date  . Anemia   . Anxiety   . Depression   . Diverticulosis   . Elevated cholesterol   . Obesity   . THYROMEGALY 07/22/2010  . Overweight 03/11/2010  . Mixed hyperlipidemia 03/11/2010  . MEASLES, HX OF 03/11/2010  . KNEE PAIN, RIGHT 05/28/2010  . HYPERTRIGLYCERIDEMIA 03/11/2010  . HEART MURMUR, HX OF 03/11/2010  . FIBROIDS, UTERUS 03/11/2010  . Diarrhea 08/05/2010  . CONSTIPATION 03/11/2010  . CHICKENPOX, HX OF 03/11/2010  . BIPOLAR AFFECTIVE DISORDER 03/11/2010  . AORTIC VALVE REPLACEMENT, HX OF 03/11/2010  . ANEMIA 05/28/2010  . Acute bronchitis 07/22/2010  . Abdominal pain, generalized 03/19/2010  . Peripheral edema 11/18/2010  . Bacterial vaginitis 11/18/2010  . Hematuria 12/18/2010  . Diverticulitis 12/18/2010  . Hyperthyroidism 12/18/2010    Past Surgical History  Procedure Date  . Appendectomy     Required revision for EColi infection, required recurrent packing  . Bowel obstruction     Requiring adhesions to be removed  . Abdominal hysterectomy     sb/l spo, total  . Aortic valve replacement   . Open heart with a cardiac aneurysm repair   . Cholecystectomy   . Tubal ligation   . Varicose vein surgery b/l legs     . Childhood exploratory surgery of heart   . Hernia repair 08-16-10    Family History  Problem Relation Age of Onset  . Scoliosis Mother   . Arthritis Mother     Rheumatoid  . Aneurysm Mother     heart  . Alcohol abuse Father   . Depression Sister   . Depression Brother   . Alcohol abuse Brother   . Cancer Maternal Grandmother     colon  . Diabetes Maternal Grandfather   . Heart disease Maternal Grandfather   . Alcohol abuse Maternal Grandfather   . Depression Paternal Grandmother   . Diabetes Paternal Grandmother   . Depression Paternal Grandfather   . Diabetes Paternal Grandfather   . Depression Sister   . Alcohol abuse Brother   . Depression Brother   . Heart disease Brother     History   Social History  . Marital Status: Married    Spouse Name: N/A    Number of Children: N/A  . Years of Education: N/A   Occupational History  . Not on file.   Social History Main Topics  . Smoking status: Former Smoker    Quit date: 05/26/1990  . Smokeless tobacco: Never Used  . Alcohol Use: 0.0 oz/week    0 drink(s) per week     very serious occasions  . Drug Use: No  . Sexually Active: No   Other Topics Concern  . Not on file  Social History Narrative  . No narrative on file    Current Outpatient Prescriptions on File Prior to Visit  Medication Sig Dispense Refill  . acetaminophen (TYLENOL) 325 MG tablet Take 650 mg by mouth every 6 (six) hours as needed.        Marland Kitchen amiodarone (PACERONE) 200 MG tablet Take 200 mg by mouth daily. 1/2 tab       . aspirin 81 MG tablet Take 81 mg by mouth daily.        . ciprofloxacin (CIPRO) 500 MG tablet Take 1 tablet (500 mg total) by mouth 2 (two) times daily.  28 tablet  0  . clonazePAM (KLONOPIN) 0.5 MG tablet Take 0.5 mg by mouth 2 (two) times daily. 1/2 tab       . DULoxetine (CYMBALTA) 30 MG capsule Take 30 mg by mouth daily.        . fenofibrate 160 MG tablet Take 160 mg by mouth daily.        . Ferrous Fumarate-Folic Acid  (HEMOCYTE-F) 324-1 MG TABS Take 1 tablet by mouth daily.  30 each  3  . furosemide (LASIX) 20 MG tablet Take 1 tablet (20 mg total) by mouth daily.  30 tablet  2  . Hyoscyamine Sulfate 0.125 MG TBDP Place under the tongue every 4 (four) hours as needed. Every 2-4 hours for abdominal pain      . lamoTRIgine (LAMICTAL) 150 MG tablet Take 150 mg by mouth daily. 1/2 tab       . lisinopril-hydrochlorothiazide (PRINZIDE,ZESTORETIC) 10-12.5 MG per tablet Take 1 tablet by mouth daily.        . metroNIDAZOLE (FLAGYL) 500 MG tablet Take 1 tablet (500 mg total) by mouth 3 (three) times daily.  42 tablet  0  . pantoprazole (PROTONIX) 40 MG tablet Take 1 tablet (40 mg total) by mouth daily.  30 tablet  1  . pravastatin (PRAVACHOL) 40 MG tablet Take 40 mg by mouth at bedtime.        . Probiotic Product (ALIGN) 4 MG CAPS Take 1 capsule by mouth daily.  30 capsule  0  . QUEtiapine (SEROQUEL) 200 MG tablet Take 200 mg by mouth at bedtime. 1-2 tab       . SINGULAIR 10 MG tablet TAKE 1 TABLET EVERY DAY  30 tablet  1  . warfarin (COUMADIN) 5 MG tablet Take 5 mg by mouth daily. 1/2 to 1 tab       . Acetaminophen-Codeine (TYLENOL/CODEINE #3) 300-30 MG per tablet Take 1 tablet by mouth every 6 (six) hours as needed. Cough/ pain  40 tablet  1  . senna (SENOKOT) 8.6 MG tablet Take 1 tablet by mouth at bedtime. Five tablets         Allergies  Allergen Reactions  . Sulfonamide Derivatives     Review of Systems  Review of Systems  Constitutional: Positive for malaise/fatigue. Negative for fever, chills and diaphoresis.  HENT: Negative for congestion.   Eyes: Negative for discharge.  Respiratory: Negative for shortness of breath.   Cardiovascular: Negative for chest pain, palpitations and leg swelling.  Gastrointestinal: Positive for abdominal pain and blood in stool. Negative for nausea, vomiting, diarrhea, constipation and melena.  Genitourinary: Negative for dysuria, urgency and hematuria.  Musculoskeletal:  Negative for falls.  Skin: Negative for rash.  Neurological: Positive for weakness. Negative for loss of consciousness and headaches.  Endo/Heme/Allergies: Negative for polydipsia.  Psychiatric/Behavioral: Negative for depression and suicidal ideas. The patient is not nervous/anxious  and does not have insomnia.     Objective  BP 96/56  Pulse 72  Temp(Src) 98.2 F (36.8 C) (Oral)  Ht 5' 7.25" (1.708 m)  Wt 218 lb 12.8 oz (99.247 kg)  BMI 34.01 kg/m2  SpO2 98%  Physical Exam  Physical Exam  Constitutional: She is oriented to person, place, and time and well-developed, well-nourished, and in no distress. No distress.  HENT:  Head: Normocephalic and atraumatic.  Eyes: Conjunctivae are normal.  Neck: Neck supple. No thyromegaly present.  Cardiovascular: Normal rate and regular rhythm.   Murmur heard.      Systolic click and 1/6 sys M  Pulmonary/Chest: Effort normal and breath sounds normal. She has no wheezes.  Abdominal: Soft. Bowel sounds are normal. She exhibits no distension and no mass. There is tenderness. There is no rebound and no guarding.       B/l LQ pain with palp  Musculoskeletal: She exhibits no edema.  Lymphadenopathy:    She has no cervical adenopathy.  Neurological: She is alert and oriented to person, place, and time.  Skin: Skin is warm and dry. No rash noted. She is not diaphoretic.  Psychiatric: Memory, affect and judgment normal.    Lab Results  Component Value Date   TSH 0.01 Repeated and verified X2.* 12/16/2010   Lab Results  Component Value Date   WBC 4.5 12/19/2010   HGB 10.1* 12/19/2010   HCT 29.2* 12/19/2010   MCV 87.9 12/19/2010   PLT 250.0 12/19/2010   Lab Results  Component Value Date   CREATININE 1.0 12/16/2010   BUN 27* 12/16/2010   NA 138 12/16/2010   K 4.1 12/16/2010   CL 103 12/16/2010   CO2 26 12/16/2010   Lab Results  Component Value Date   ALT 24 12/16/2010   AST 27 12/16/2010   ALKPHOS 23* 12/16/2010   BILITOT 0.6 12/16/2010    Lab Results  Component Value Date   CHOL 169 12/16/2010   Lab Results  Component Value Date   HDL 43.40 12/16/2010   No results found for this basename: [facility]   Lab Results  Component Value Date   TRIG 244.0* 12/16/2010   Lab Results  Component Value Date   CHOLHDL 4 12/16/2010     Assessment & Plan  DIARRHEA Increased stool frequency since seen last week. Was going 1-2 times daily when seen last week and is now going 4-5 times daily and has developed some rectal irritation and mild rectal bleeding with blood noted on tissue in past day or two. Will check stool cultures and place her on Lactinex, for rectal irritation start Anusol HC supp bid and seek immediate care if bleeding becomes more significant. Repeat CBC today  BIPOLAR AFFECTIVE DISORDER Seen by her psychiatrist this am, no change to meds, her mother is in the hospital in [place] right now very ill, she is in need of cardiac surgery but they are not sure she will survive the surgery so they have not proceeded thus far, this is contributing to her stressors  Hyperthyroidism Reviewed with patient today has an appt with endocrine in August, patient asymptomatic at present.  Diverticulitis Just treated with course of cipro and flagyl. Has appt with GI next month but may need to be seen sooner if symptoms worsen again  Peripheral edema Patient now hypovolemic due to diarrhea, will hold her Furosemide and Lisinopril hct for next couple of days and recheck bp in 3 days or as needed

## 2010-12-24 NOTE — Assessment & Plan Note (Signed)
Just treated with course of cipro and flagyl. Has appt with GI next month but may need to be seen sooner if symptoms worsen again

## 2010-12-24 NOTE — Assessment & Plan Note (Signed)
Reviewed with patient today has an appt with endocrine in August, patient asymptomatic at present.

## 2010-12-24 NOTE — Assessment & Plan Note (Signed)
The patient has a documented history of diverticulosis and presently being treated with Cipro and Flagyl for increased abdominal pain tolerating medications well he does believe she's improving we'll continue them and maintain a probiotic she is to call or seek immediate care if symptoms worsen again.

## 2010-12-24 NOTE — Progress Notes (Deleted)
  Subjective:    Patient ID: [name], female    DOB: 12/16/1950, 60 y.o.   MRN: [id number]  HPI    Review of Systems     Objective:   Physical Exam        Assessment & Plan:   DIARRHEA Increased stool frequency since seen last week. Was going 1-2 times daily when seen last week and is now going 4-5 times daily and has developed some rectal irritation and mild rectal bleeding with blood noted on tissue in past day or two. Will check stool cultures and place her on Lactinex, for rectal irritation start Anusol HC supp bid and seek immediate care if bleeding becomes more significant. Repeat CBC today  BIPOLAR AFFECTIVE DISORDER Seen by her psychiatrist this am, no change to meds, her mother is in the hospital in [place] right now very ill, she is in need of cardiac surgery but they are not sure she will survive the surgery so they have not proceeded thus far, this is contributing to her stressors  Hyperthyroidism Reviewed with patient today has an appt with endocrine in August, patient asymptomatic at present.  Diverticulitis Just treated with course of cipro and flagyl. Has appt with GI next month but may need to be seen sooner if symptoms worsen again  Peripheral edema Patient now hypovolemic due to diarrhea, will hold her Furosemide and Lisinopril hct for next couple of days and recheck bp in 3 days or as needed

## 2010-12-24 NOTE — Progress Notes (Signed)
CAYLE CORDOBA [id number] [birth year] 12/24/2010      Progress Note-Follow Up  Subjective  Chief Complaint  Chief Complaint  Patient presents with  . Follow-up    follow up    HPI  Patient is a 60 year old Caucasian female in today for followup of multiple concerns. Recent lab work revealed her anemia was worsening and on review of her chart she had called to note increasing abdominal pain. As a result we are treating her empirically for diverticulitis with Cipro and Flagyl. She is tolerating his medications and the pain has not worsened but it has not fully resolved either. It was generalized in the lower areas at last contact but is now more right-sided. No fevers, chills, nausea, vomiting, diarrhea, bloody stool. She is moving her bowels. No other signs of acute illness as such as malaise or myalgias. She does have some mild dysuria only recently. He is tolerating her antibiotics and eating yogurt daily.  Past Medical History  Diagnosis Date  . Anemia   . Anxiety   . Depression   . Diverticulosis   . Elevated cholesterol   . Obesity   . THYROMEGALY 07/22/2010  . Overweight 03/11/2010  . Mixed hyperlipidemia 03/11/2010  . MEASLES, HX OF 03/11/2010  . KNEE PAIN, RIGHT 05/28/2010  . HYPERTRIGLYCERIDEMIA 03/11/2010  . HEART MURMUR, HX OF 03/11/2010  . FIBROIDS, UTERUS 03/11/2010  . Diarrhea 08/05/2010  . CONSTIPATION 03/11/2010  . CHICKENPOX, HX OF 03/11/2010  . BIPOLAR AFFECTIVE DISORDER 03/11/2010  . AORTIC VALVE REPLACEMENT, HX OF 03/11/2010  . ANEMIA 05/28/2010  . Acute bronchitis 07/22/2010  . Abdominal pain, generalized 03/19/2010  . Peripheral edema 11/18/2010  . Bacterial vaginitis 11/18/2010  . Hematuria 12/18/2010  . Diverticulitis 12/18/2010  . Hyperthyroidism 12/18/2010    Past Surgical History  Procedure Date  . Appendectomy     Required revision for EColi infection, required recurrent packing  . Bowel obstruction     Requiring adhesions to be removed  . Abdominal  hysterectomy     sb/l spo, total  . Aortic valve replacement   . Open heart with a cardiac aneurysm repair   . Cholecystectomy   . Tubal ligation   . Varicose vein surgery b/l legs   . Childhood exploratory surgery of heart   . Hernia repair 08-16-10    Family History  Problem Relation Age of Onset  . Scoliosis Mother   . Arthritis Mother     Rheumatoid  . Aneurysm Mother     heart  . Alcohol abuse Father   . Depression Sister   . Depression Brother   . Alcohol abuse Brother   . Cancer Maternal Grandmother     colon  . Diabetes Maternal Grandfather   . Heart disease Maternal Grandfather   . Alcohol abuse Maternal Grandfather   . Depression Paternal Grandmother   . Diabetes Paternal Grandmother   . Depression Paternal Grandfather   . Diabetes Paternal Grandfather   . Depression Sister   . Alcohol abuse Brother   . Depression Brother   . Heart disease Brother     History   Social History  . Marital Status: Married    Spouse Name: N/A    Number of Children: N/A  . Years of Education: N/A   Occupational History  . Not on file.   Social History Main Topics  . Smoking status: Former Smoker    Quit date: 05/26/1990  . Smokeless tobacco: Never Used  . Alcohol Use: 0.0 oz/week  0 drink(s) per week     very serious occasions  . Drug Use: No  . Sexually Active: No   Other Topics Concern  . Not on file   Social History Narrative  . No narrative on file    Current Outpatient Prescriptions on File Prior to Visit  Medication Sig Dispense Refill  . acetaminophen (TYLENOL) 325 MG tablet Take 650 mg by mouth every 6 (six) hours as needed.        . Acetaminophen-Codeine (TYLENOL/CODEINE #3) 300-30 MG per tablet Take 1 tablet by mouth every 6 (six) hours as needed. Cough/ pain  40 tablet  1  . amiodarone (PACERONE) 200 MG tablet Take 200 mg by mouth daily. 1/2 tab       . aspirin 81 MG tablet Take 81 mg by mouth daily.        . ciprofloxacin (CIPRO) 500 MG tablet  Take 1 tablet (500 mg total) by mouth 2 (two) times daily.  28 tablet  0  . clonazePAM (KLONOPIN) 0.5 MG tablet Take 0.5 mg by mouth 2 (two) times daily. 1/2 tab       . DULoxetine (CYMBALTA) 30 MG capsule Take 30 mg by mouth daily.        . fenofibrate 160 MG tablet Take 160 mg by mouth daily.        . Ferrous Fumarate-Folic Acid (HEMOCYTE-F) 324-1 MG TABS Take 1 tablet by mouth daily.  30 each  3  . furosemide (LASIX) 20 MG tablet Take 1 tablet (20 mg total) by mouth daily.  30 tablet  2  . Hyoscyamine Sulfate 0.125 MG TBDP Place under the tongue every 4 (four) hours as needed. Every 2-4 hours for abdominal pain      . lamoTRIgine (LAMICTAL) 150 MG tablet Take 150 mg by mouth daily. 1/2 tab       . lisinopril-hydrochlorothiazide (PRINZIDE,ZESTORETIC) 10-12.5 MG per tablet Take 1 tablet by mouth daily.        Marland Kitchen lubiprostone (AMITIZA) 24 MCG capsule Take 24 mcg by mouth 2 (two) times daily with meals.        . metroNIDAZOLE (FLAGYL) 500 MG tablet Take 1 tablet (500 mg total) by mouth 3 (three) times daily.  42 tablet  0  . pantoprazole (PROTONIX) 40 MG tablet Take 1 tablet (40 mg total) by mouth daily.  30 tablet  1  . pravastatin (PRAVACHOL) 40 MG tablet Take 40 mg by mouth at bedtime.        . Probiotic Product (ALIGN) 4 MG CAPS Take 1 capsule by mouth daily.  30 capsule  0  . QUEtiapine (SEROQUEL) 200 MG tablet Take 200 mg by mouth at bedtime. 1-2 tab       . scopolamine (TRANSDERM-SCOP) 1.5 MG Place 1 patch (1.5 mg total) onto the skin every 3 (three) days.  4 patch  0  . senna (SENOKOT) 8.6 MG tablet Take 1 tablet by mouth at bedtime. Five tablets       . SINGULAIR 10 MG tablet TAKE 1 TABLET EVERY DAY  30 tablet  1  . warfarin (COUMADIN) 5 MG tablet Take 5 mg by mouth daily. 1/2 to 1 tab         Allergies  Allergen Reactions  . Sulfonamide Derivatives     Review of Systems  Review of Systems  Constitutional: Negative for fever and malaise/fatigue.  HENT: Negative for congestion.     Eyes: Negative for discharge.  Respiratory: Negative for shortness of  breath.   Cardiovascular: Negative for chest pain, palpitations and leg swelling.  Gastrointestinal: Positive for nausea and abdominal pain. Negative for heartburn, diarrhea, constipation, blood in stool and melena.  Genitourinary: Negative for dysuria.  Musculoskeletal: Negative for falls.  Skin: Negative for rash.  Neurological: Negative for loss of consciousness and headaches.  Endo/Heme/Allergies: Negative for polydipsia.  Psychiatric/Behavioral: Negative for depression and suicidal ideas. The patient is not nervous/anxious and does not have insomnia.     Objective  BP 117/73  Pulse 73  Temp(Src) 98.1 F (36.7 C) (Oral)  Ht 5' 7.25" (1.708 m)  Wt 216 lb 12.8 oz (98.34 kg)  BMI 33.70 kg/m2  SpO2 97%  Physical Exam  Physical Exam  Constitutional: She is oriented to person, place, and time and well-developed, well-nourished, and in no distress. No distress.  HENT:  Head: Normocephalic and atraumatic.  Eyes: Conjunctivae are normal.  Neck: Neck supple. No thyromegaly present.  Cardiovascular: Normal rate, regular rhythm and normal heart sounds.   No murmur heard. Pulmonary/Chest: Effort normal and breath sounds normal. She has no wheezes.  Abdominal: Soft. Bowel sounds are normal. She exhibits no distension and no mass. There is tenderness. There is no rebound and no guarding.       Mild suprapubic discomfort with palpation noted today  Musculoskeletal: She exhibits no edema.  Lymphadenopathy:    She has no cervical adenopathy.  Neurological: She is alert and oriented to person, place, and time.  Skin: Skin is warm and dry. No rash noted. She is not diaphoretic.  Psychiatric: Memory, affect and judgment normal.    Lab Results  Component Value Date   TSH 0.01 Repeated and verified X2.* 12/16/2010   Lab Results  Component Value Date   WBC 4.5 12/19/2010   HGB 10.1* 12/19/2010   HCT 29.2* 12/19/2010    MCV 87.9 12/19/2010   PLT 250.0 12/19/2010   Lab Results  Component Value Date   CREATININE 1.0 12/16/2010   BUN 27* 12/16/2010   NA 138 12/16/2010   K 4.1 12/16/2010   CL 103 12/16/2010   CO2 26 12/16/2010   Lab Results  Component Value Date   ALT 24 12/16/2010   AST 27 12/16/2010   ALKPHOS 23* 12/16/2010   BILITOT 0.6 12/16/2010   Lab Results  Component Value Date   CHOL 169 12/16/2010   Lab Results  Component Value Date   HDL 43.40 12/16/2010   No results found for this basename: [facility]   Lab Results  Component Value Date   TRIG 244.0* 12/16/2010   Lab Results  Component Value Date   CHOLHDL 4 12/16/2010     Assessment & Plan ANEMIA Anemia was noted to be worse off on routine blood work earlier in the week. Check today was improving again. She is tolerating her medications and hasn't been asked to restart iron. We will continue to monitor by checking a CBC again next week.  Hematuria He is having some suprapubic discomfort and with this urinalysis is round but urine culture comes back negative. We'll continue to monitor.  Diverticulitis The patient has a documented history of diverticulosis and presently being treated with Cipro and Flagyl for increased abdominal pain tolerating medications well he does believe she's improving we'll continue them and maintain a probiotic she is to call or seek immediate care if symptoms worsen again.  Hyperthyroidism TSH suppressed on recent labs, T4 elevated on confirmation, has been referred to endocrinology for further evaluation, patient is asymptomatic  Patient Active Problem List  Diagnoses  . FIBROIDS, UTERUS  . HYPERTRIGLYCERIDEMIA  . MIXED HYPERLIPIDEMIA  . OVERWEIGHT  . BIPOLAR AFFECTIVE DISORDER  . DIVERTICULOSIS OF COLON  . CONSTIPATION  . ABDOMINAL PAIN, GENERALIZED  . MEASLES, HX OF  . HEART MURMUR, HX OF  . CHICKENPOX, HX OF  . AORTIC VALVE REPLACEMENT, HX OF  . ANEMIA  . KNEE PAIN, RIGHT  . THYROMEGALY   . ACUTE BRONCHITIS  . DIARRHEA  . Peripheral edema  . Bacterial vaginitis  . Hematuria  . Diverticulitis  . Hyperthyroidism

## 2010-12-24 NOTE — Assessment & Plan Note (Signed)
TSH suppressed on recent labs, T4 elevated on confirmation, has been referred to endocrinology for further evaluation, patient is asymptomatic

## 2010-12-24 NOTE — Assessment & Plan Note (Signed)
Anemia was noted to be worse off on routine blood work earlier in the week. Check today was improving again. She is tolerating her medications and hasn't been asked to restart iron. We will continue to monitor by checking a CBC again next week.

## 2010-12-24 NOTE — Assessment & Plan Note (Addendum)
Increased stool frequency since seen last week. Was going 1-2 times daily when seen last week and is now going 4-5 times daily and has developed some rectal irritation and mild rectal bleeding with blood noted on tissue in past day or two. Will check stool cultures and place her on Lactinex, for rectal irritation start Anusol HC supp bid and seek immediate care if bleeding becomes more significant. Repeat CBC today

## 2010-12-25 DIAGNOSIS — E0500 Thyrotoxicosis with diffuse goiter without thyrotoxic crisis or storm: Secondary | ICD-10-CM

## 2010-12-25 HISTORY — DX: Thyrotoxicosis with diffuse goiter without thyrotoxic crisis or storm: E05.00

## 2010-12-25 NOTE — Progress Notes (Signed)
Addended by: Baldemar Lenis R on: 12/25/2010 11:05 AM   Modules accepted: Orders

## 2010-12-26 LAB — FECAL LACTOFERRIN, QUANT: Lactoferrin: NEGATIVE

## 2010-12-26 LAB — CLOSTRIDIUM DIFFICILE BY PCR: Toxigenic C. Difficile by PCR: NOT DETECTED

## 2010-12-26 NOTE — Telephone Encounter (Signed)
Resent iron RX

## 2010-12-27 ENCOUNTER — Ambulatory Visit (INDEPENDENT_AMBULATORY_CARE_PROVIDER_SITE_OTHER): Payer: PRIVATE HEALTH INSURANCE | Admitting: Family Medicine

## 2010-12-27 ENCOUNTER — Ambulatory Visit (HOSPITAL_BASED_OUTPATIENT_CLINIC_OR_DEPARTMENT_OTHER)
Admission: RE | Admit: 2010-12-27 | Discharge: 2010-12-27 | Disposition: A | Discharge status: Home / Self Care | Payer: PRIVATE HEALTH INSURANCE | Source: Ambulatory Visit | Attending: Family Medicine | Admitting: Family Medicine

## 2010-12-27 ENCOUNTER — Encounter: Payer: Self-pay | Admitting: Family Medicine

## 2010-12-27 DIAGNOSIS — R1084 Generalized abdominal pain: Secondary | ICD-10-CM

## 2010-12-27 DIAGNOSIS — N289 Disorder of kidney and ureter, unspecified: Secondary | ICD-10-CM

## 2010-12-27 DIAGNOSIS — R1031 Right lower quadrant pain: Secondary | ICD-10-CM

## 2010-12-27 DIAGNOSIS — K5732 Diverticulitis of large intestine without perforation or abscess without bleeding: Secondary | ICD-10-CM

## 2010-12-27 DIAGNOSIS — R5383 Other fatigue: Secondary | ICD-10-CM

## 2010-12-27 DIAGNOSIS — R197 Diarrhea, unspecified: Secondary | ICD-10-CM | POA: Insufficient documentation

## 2010-12-27 DIAGNOSIS — R109 Unspecified abdominal pain: Secondary | ICD-10-CM

## 2010-12-27 DIAGNOSIS — R5381 Other malaise: Secondary | ICD-10-CM

## 2010-12-27 DIAGNOSIS — K5792 Diverticulitis of intestine, part unspecified, without perforation or abscess without bleeding: Secondary | ICD-10-CM

## 2010-12-27 DIAGNOSIS — R609 Edema, unspecified: Secondary | ICD-10-CM

## 2010-12-27 DIAGNOSIS — K625 Hemorrhage of anus and rectum: Secondary | ICD-10-CM | POA: Insufficient documentation

## 2010-12-27 DIAGNOSIS — D649 Anemia, unspecified: Secondary | ICD-10-CM

## 2010-12-27 LAB — CBC WITH DIFFERENTIAL/PLATELET
Basophils Absolute: 0.1 10*3/uL (ref 0.0–0.1)
Basophils Relative: 1.3 % (ref 0.0–3.0)
Eosinophils Absolute: 0.2 10*3/uL (ref 0.0–0.7)
Eosinophils Relative: 3.6 % (ref 0.0–5.0)
HCT: 30.4 % — ABNORMAL LOW (ref 36.0–46.0)
Hemoglobin: 10.3 g/dL — ABNORMAL LOW (ref 12.0–15.0)
Lymphocytes Relative: 30.2 % (ref 12.0–46.0)
Lymphs Abs: 1.4 10*3/uL (ref 0.7–4.0)
MCHC: 33.9 g/dL (ref 30.0–36.0)
MCV: 87.9 fl (ref 78.0–100.0)
Monocytes Absolute: 0.6 10*3/uL (ref 0.1–1.0)
Monocytes Relative: 12 % (ref 3.0–12.0)
Neutro Abs: 2.4 10*3/uL (ref 1.4–7.7)
Neutrophils Relative %: 52.9 % (ref 43.0–77.0)
Platelets: 248 10*3/uL (ref 150.0–400.0)
RBC: 3.45 Mil/uL — ABNORMAL LOW (ref 3.87–5.11)
RDW: 13.1 % (ref 11.5–14.6)
WBC: 4.6 10*3/uL (ref 4.5–10.5)

## 2010-12-27 LAB — RENAL FUNCTION PANEL
Albumin: 4.2 g/dL (ref 3.5–5.2)
BUN: 26 mg/dL — ABNORMAL HIGH (ref 6–23)
CO2: 27 mEq/L (ref 19–32)
Calcium: 9 mg/dL (ref 8.4–10.5)
Chloride: 108 mEq/L (ref 96–112)
Creatinine, Ser: 1.1 mg/dL (ref 0.4–1.2)
GFR: 51.66 mL/min — ABNORMAL LOW (ref >60.00)
Glucose, Bld: 108 mg/dL — ABNORMAL HIGH (ref 70–99)
Phosphorus: 2.6 mg/dL (ref 2.3–4.6)
Potassium: 4.5 mEq/L (ref 3.5–5.1)
Sodium: 141 mEq/L (ref 135–145)

## 2010-12-27 NOTE — Patient Instructions (Signed)
Chronic Diarrhea Diarrhea is loose, watery stools. Having diarrhea means passing loose stools 3 or more times a day. Diarrhea that lasts longer than 4 weeks is considered long-lasting (chronic). Symptoms of chronic diarrhea may be continual or may come and go. People of all ages can get diarrhea. Body fluid loss (dehydration) may occur as a result of diarrhea. This means the body does not have as many fluids and salts (electrolytes) as it needs. CAUSES There are many causes of chronic diarrhea. Causes may be different for children and adults. The various causes can be grouped into 2 categories: diarrhea caused by an infection and diarrhea not caused by an infection. Sometimes, the cause is unknown. Diarrhea caused by an infection may result from:  Parasites.   Bacteria.   Viral infections.  Diarrhea not caused by an infection may result from:  Irritable bowel syndrome.   Reaction to medicines, such as antibiotics, cancer drugs, blood pressure medicines, and antacids.   Intestinal disease (Crohn's disease, ulcerative colitis, celiac disease).   Food allergies or sensitivity to additives (fructose, lactose, sugar substitutes).   Tumors.   Diabetes, thyroid disease, and other endocrine diseases.   Reduced blood flow to the intestine.   Previous surgery or radiation of the abdomen or gastrointestinal tract.  Risk factors for chronic diarrhea include:  Having a severely weakened immune system, such as from HIV/AIDS.   Taking certain types of cancer-fighting drugs (chemotherapy) or other medicines.   A recent organ transplant.   Having a portion of the stomach removed.   Traveling to countries where food and water supplies are often contaminated.  SYMPTOMS In addition to frequent, loose stools, diarrhea may cause:  Cramping.   Abdominal pain.   Nausea.   Urgent need to use the bathroom, or loss of bowel control.  If dehydration occurs, problems include:  Thirst.   Less  frequent urination.   Dark urine.   Dry skin.   Fatigue.   Dizziness.  Infections that cause diarrhea may also cause a fever, chills, or bloody stools. DIAGNOSIS Diagnosis may be difficult. Your caregiver must take a careful history and perform a physical exam. Tests given are based on your symptoms and history. Tests may include:  Blood or stool tests, in which 3 or more stool samples may be examined. Stool cultures may be used to test for bacteria or parasites.   X-rays.   A procedure in which a thin tube is inserted into the mouth or rectum (endoscopy). This allows the caregiver to look inside the intestine.  TREATMENT  Diarrhea caused by an infection can often be treated with antibiotics.   Diarrhea not caused by an infection is more difficult to diagnose and treat. Long-term medicine use or surgery may be required. Specific treatment should be discussed with your caregiver.   If the cause cannot determined, treatment to relieve symptoms includes:   Preventing dehydration. Serious health problems can occur if you do not maintain proper fluid levels. Many oral rehydration solutions (ORS) are available at drug stores. Ask your caregiver what product is best for you.   Not drinking beverages that contain caffeine (tea, coffee, soft drinks).   Not drinking alcohol. It causes dehydration.   Not relying on sports drinks and broths alone to maintain proper fluid levels. They should not be used to prevent severe dehydration.   Maintaining well-balanced nutrition. This may help you recover faster.  PREVENTION  Drink clean or purified water.   Use proper food handling techniques.  Maintain proper hand-washing habits.  HOME CARE INSTRUCTIONS  Avoid:   Caffeine.   Greasy foods.   High fiber.   If you have problems digesting lactose during or after an episode of diarrhea, you might want to try yogurt. Yogurt is often better tolerated, because it has less lactose than milk.  Yogurt with active, live bacterial cultures may even help you recover faster.  SEEK MEDICAL CARE IF: The person with diarrhea is an otherwise healthy adult and has:  Signs of dehydration.   Diarrhea for more than 2 days.   Severe pain in the abdomen or rectum.   An oral temperature above 104.   Stools containing blood or pus.   Stools that are black and tarry.  SEEK IMMEDIATE MEDICAL CARE IF: The person with diarrhea is a child, elderly person, or has a weakened immune system and has:  Signs of dehydration.   Diarrhea for more than 1 day.   Severe pain in the abdomen or rectum.   An oral temperature above 104, not controlled by medicine.   Stools containing blood or pus.   Stools that are black and tarry.  Document Released: 08/02/2003 Document Re-Released: 10/30/2009 [facility] Patient Information 2011 [place], [place].

## 2010-12-29 LAB — STOOL CULTURE

## 2010-12-30 ENCOUNTER — Encounter: Payer: Self-pay | Admitting: Family Medicine

## 2010-12-30 DIAGNOSIS — R5383 Other fatigue: Secondary | ICD-10-CM | POA: Insufficient documentation

## 2010-12-30 DIAGNOSIS — N289 Disorder of kidney and ureter, unspecified: Secondary | ICD-10-CM | POA: Insufficient documentation

## 2010-12-30 NOTE — Assessment & Plan Note (Signed)
She is asked to keep holding her HCT and her Furosemide until we recheck her Creatinine again next week and to try wearing compression hose and to elevate feet above heart at least twice daily for 15 minutes

## 2010-12-30 NOTE — Assessment & Plan Note (Signed)
Repeat renal panel today. Creatinine has improved today will continue to monitor

## 2010-12-30 NOTE — Assessment & Plan Note (Signed)
Overall patient does acknowledge that the abdominal pain is improving since the treatment with Cipro and Flagyl but on palpation today she does have some persistent right lower quadrant pain. Due to the intensity of her symptoms and the persistence we will check a CT abdomen without contrast today to further evaluate she is instructed to maintain a bland low fat low spice diet over the weekend.

## 2010-12-30 NOTE — Assessment & Plan Note (Signed)
Has finished Cipro and Flagyl

## 2010-12-30 NOTE — Progress Notes (Signed)
[name] [id number] 1950/11/27 12/30/2010      Progress Note-Follow Up  Subjective  Chief Complaint  Chief Complaint  Patient presents with  . Follow-up    3 day follow up    HPI  Patient is in today for evaluation of multiple complaints. Is accompanied by her daughter today. Her symptoms of abdominal pain appear to have improved. She's had no fevers or chills. She denies anorexia in fact reports she is eating well. Her bowels are moving and not bloody or tarry. They have been loose frequently. Her major complaint today is of persistent fatigue. Fatigue was not her presenting complaints 2 weeks ago but has been bad for about the last week. At her last visit she was noted to have some new onset renal insufficiency which is likely multifactorial but largely a product of her dehydration and multiple medications. Her Cipro Flagyl lisinopril HCT and furosemide will help and we will recheck her renal today. She reports she's been hydrating well. She is concerned about some peripheral edema which occurred and increase slightly when we stopped her diuretics. It will go up her leg past the knee by the end of the day. It is better in the morning. She denies chest pain, shortness of breath, palpitations. No fevers or congestion. She is not elevating her feet or wearing compression hose at this time.  Past Medical History  Diagnosis Date  . Anemia   . Anxiety   . Depression   . Diverticulosis   . Elevated cholesterol   . Obesity   . THYROMEGALY 07/22/2010  . Overweight 03/11/2010  . Mixed hyperlipidemia 03/11/2010  . MEASLES, HX OF 03/11/2010  . KNEE PAIN, RIGHT 05/28/2010  . HYPERTRIGLYCERIDEMIA 03/11/2010  . HEART MURMUR, HX OF 03/11/2010  . FIBROIDS, UTERUS 03/11/2010  . Diarrhea 08/05/2010  . CONSTIPATION 03/11/2010  . CHICKENPOX, HX OF 03/11/2010  . BIPOLAR AFFECTIVE DISORDER 03/11/2010  . AORTIC VALVE REPLACEMENT, HX OF 03/11/2010  . ANEMIA 05/28/2010  . Acute bronchitis 07/22/2010  .  Abdominal pain, generalized 03/19/2010  . Peripheral edema 11/18/2010  . Bacterial vaginitis 11/18/2010  . Hematuria 12/18/2010  . Diverticulitis 12/18/2010  . Hyperthyroidism 12/18/2010  . Fatigue 12/30/2010  . Renal insufficiency 12/30/2010    Past Surgical History  Procedure Date  . Appendectomy     Required revision for EColi infection, required recurrent packing  . Bowel obstruction     Requiring adhesions to be removed  . Abdominal hysterectomy     sb/l spo, total  . Aortic valve replacement   . Open heart with a cardiac aneurysm repair   . Cholecystectomy   . Tubal ligation   . Varicose vein surgery b/l legs   . Childhood exploratory surgery of heart   . Hernia repair 08-16-10    Family History  Problem Relation Age of Onset  . Scoliosis Mother   . Arthritis Mother     Rheumatoid  . Aneurysm Mother     heart  . Alcohol abuse Father   . Depression Sister   . Depression Brother   . Alcohol abuse Brother   . Cancer Maternal Grandmother     colon  . Diabetes Maternal Grandfather   . Heart disease Maternal Grandfather   . Alcohol abuse Maternal Grandfather   . Depression Paternal Grandmother   . Diabetes Paternal Grandmother   . Depression Paternal Grandfather   . Diabetes Paternal Grandfather   . Depression Sister   . Alcohol abuse Brother   . Depression  Brother   . Heart disease Brother     History   Social History  . Marital Status: Married    Spouse Name: N/A    Number of Children: N/A  . Years of Education: N/A   Occupational History  . Not on file.   Social History Main Topics  . Smoking status: Former Smoker    Quit date: 05/26/1990  . Smokeless tobacco: Never Used  . Alcohol Use: 0.0 oz/week    0 drink(s) per week     very serious occasions  . Drug Use: No  . Sexually Active: No   Other Topics Concern  . Not on file   Social History Narrative  . No narrative on file    Current Outpatient Prescriptions on File Prior to Visit  Medication  Sig Dispense Refill  . acetaminophen (TYLENOL) 325 MG tablet Take 650 mg by mouth every 6 (six) hours as needed.        . Acetaminophen-Codeine (TYLENOL/CODEINE #3) 300-30 MG per tablet Take 1 tablet by mouth every 6 (six) hours as needed. Cough/ pain  40 tablet  1  . aspirin 81 MG tablet Take 81 mg by mouth daily.        . ciprofloxacin (CIPRO) 500 MG tablet Take 1 tablet (500 mg total) by mouth 2 (two) times daily.  28 tablet  0  . clonazePAM (KLONOPIN) 0.5 MG tablet Take 0.5 mg by mouth 2 (two) times daily. 1/2 tab       . DULoxetine (CYMBALTA) 30 MG capsule Take 30 mg by mouth daily.        . fenofibrate 160 MG tablet Take 160 mg by mouth daily.        . Ferrous Fumarate-Folic Acid (HEMOCYTE-F) 324-1 MG TABS Take 1 tablet by mouth daily.  30 each  3  . hydrocortisone (ANUSOL-HC) 25 MG suppository 1 supp pr bid x 5 days and then prn rectal bleeding or irritation  24 suppository  1  . Hyoscyamine Sulfate 0.125 MG TBDP Place under the tongue every 4 (four) hours as needed. Every 2-4 hours for abdominal pain      . lactobacillus acidophilus & bulgar (LACTINEX) chewable tablet Chew 2 tablets by mouth 3 (three) times daily with meals.  180 tablet  1  . lamoTRIgine (LAMICTAL) 150 MG tablet Take 150 mg by mouth daily. 1/2 tab       . metroNIDAZOLE (FLAGYL) 500 MG tablet Take 1 tablet (500 mg total) by mouth 3 (three) times daily.  42 tablet  0  . pantoprazole (PROTONIX) 40 MG tablet Take 1 tablet (40 mg total) by mouth daily.  30 tablet  1  . pravastatin (PRAVACHOL) 40 MG tablet Take 40 mg by mouth at bedtime.        . Probiotic Product (ALIGN) 4 MG CAPS Take 1 capsule by mouth daily.  30 capsule  0  . QUEtiapine (SEROQUEL) 200 MG tablet Take 200 mg by mouth at bedtime. 1-2 tab       . senna (SENOKOT) 8.6 MG tablet Take 1 tablet by mouth at bedtime. Five tablets       . SINGULAIR 10 MG tablet TAKE 1 TABLET EVERY DAY  30 tablet  1  . warfarin (COUMADIN) 5 MG tablet Take 5 mg by mouth daily. 1/2 to 1  tab       . furosemide (LASIX) 20 MG tablet Take 1 tablet (20 mg total) by mouth daily.  30 tablet  2  .  lisinopril-hydrochlorothiazide (PRINZIDE,ZESTORETIC) 10-12.5 MG per tablet Take 1 tablet by mouth daily.          Allergies  Allergen Reactions  . Sulfonamide Derivatives     Review of Systems  Review of Systems  Constitutional: Positive for malaise/fatigue. Negative for fever and chills.  HENT: Negative for congestion.   Eyes: Negative for discharge.  Respiratory: Negative for shortness of breath.   Cardiovascular: Positive for leg swelling. Negative for chest pain, palpitations, orthopnea and PND.  Gastrointestinal: Positive for abdominal pain and diarrhea. Negative for heartburn, nausea, vomiting, constipation, blood in stool and melena.       Pain now milder and intermittent, located in RLQ  Genitourinary: Negative for dysuria.  Musculoskeletal: Negative for falls.  Skin: Negative for rash.  Neurological: Negative for loss of consciousness and headaches.  Endo/Heme/Allergies: Negative for polydipsia.  Psychiatric/Behavioral: Negative for depression and suicidal ideas. The patient is not nervous/anxious and does not have insomnia.     Objective  BP 127/74  Pulse 82  Temp(Src) 98.3 F (36.8 C) (Oral)  Ht 5' 7.25" (1.708 m)  Wt 217 lb 6.4 oz (98.612 kg)  BMI 33.80 kg/m2  SpO2 98%  Physical Exam  Physical Exam  Constitutional: She is oriented to person, place, and time and well-developed, well-nourished, and in no distress. No distress.  HENT:  Head: Normocephalic and atraumatic.  Eyes: Conjunctivae are normal.  Neck: Neck supple. No thyromegaly present.  Cardiovascular: Normal rate and regular rhythm.   Murmur heard.      Systolic click  Pulmonary/Chest: Effort normal and breath sounds normal. She has no wheezes.  Abdominal: She exhibits no distension and no mass. There is tenderness. There is no rebound and no guarding.       RLQ pain with palp    Musculoskeletal: She exhibits no edema.  Lymphadenopathy:    She has no cervical adenopathy.  Neurological: She is alert and oriented to person, place, and time.  Skin: Skin is warm and dry. No rash noted. She is not diaphoretic.  Psychiatric: Memory, affect and judgment normal.    Lab Results  Component Value Date   TSH 0.01 Repeated and verified X2.* 12/16/2010   Lab Results  Component Value Date   WBC 4.6 12/27/2010   HGB 10.3* 12/27/2010   HCT 30.4* 12/27/2010   MCV 87.9 12/27/2010   PLT 248.0 12/27/2010   Lab Results  Component Value Date   CREATININE 1.1 12/27/2010   BUN 26* 12/27/2010   NA 141 12/27/2010   K 4.5 12/27/2010   CL 108 12/27/2010   CO2 27 12/27/2010   Lab Results  Component Value Date   ALT 24 12/16/2010   AST 27 12/16/2010   ALKPHOS 23* 12/16/2010   BILITOT 0.6 12/16/2010   Lab Results  Component Value Date   CHOL 169 12/16/2010   Lab Results  Component Value Date   HDL 43.40 12/16/2010   No results found for this basename: LDLCALC   Lab Results  Component Value Date   TRIG 244.0* 12/16/2010   Lab Results  Component Value Date   CHOLHDL 4 12/16/2010     Assessment & Plan  ABDOMINAL PAIN, GENERALIZED Overall patient does acknowledge that the abdominal pain is improving since the treatment with Cipro and Flagyl but on palpation today she does have some persistent right lower quadrant pain. Due to the intensity of her symptoms and the persistence we will check a CT abdomen without contrast today to further evaluate she is instructed  to maintain a bland low fat low spice diet over the weekend.  Diverticulitis Has finished Cipro and Flagyl  Fatigue Her largest complaint today is of fatigue. She reports it is worse and over this past week and was not her major complaint when she was first seen last week. She is encouraged to increase her rest and hydration this weekend and we will reevaluate with labs and then see her again next week  Renal insufficiency Repeat  renal panel today. Creatinine has improved today will continue to monitor  Peripheral edema She is asked to keep holding her HCT and her Furosemide until we recheck her Creatinine again next week and to try wearing compression hose and to elevate feet above heart at least twice daily for 15 minutes

## 2010-12-30 NOTE — Assessment & Plan Note (Signed)
Her largest complaint today is of fatigue. She reports it is worse and over this past week and was not her major complaint when she was first seen last week. She is encouraged to increase her rest and hydration this weekend and we will reevaluate with labs and then see her again next week

## 2010-12-31 ENCOUNTER — Encounter: Payer: Self-pay | Admitting: Family Medicine

## 2010-12-31 ENCOUNTER — Ambulatory Visit (INDEPENDENT_AMBULATORY_CARE_PROVIDER_SITE_OTHER): Payer: PRIVATE HEALTH INSURANCE | Admitting: Family Medicine

## 2010-12-31 DIAGNOSIS — E0590 Thyrotoxicosis, unspecified without thyrotoxic crisis or storm: Secondary | ICD-10-CM

## 2010-12-31 DIAGNOSIS — N289 Disorder of kidney and ureter, unspecified: Secondary | ICD-10-CM

## 2010-12-31 DIAGNOSIS — K5732 Diverticulitis of large intestine without perforation or abscess without bleeding: Secondary | ICD-10-CM

## 2010-12-31 DIAGNOSIS — R5383 Other fatigue: Secondary | ICD-10-CM

## 2010-12-31 DIAGNOSIS — R197 Diarrhea, unspecified: Secondary | ICD-10-CM

## 2010-12-31 DIAGNOSIS — R609 Edema, unspecified: Secondary | ICD-10-CM

## 2010-12-31 DIAGNOSIS — R5381 Other malaise: Secondary | ICD-10-CM

## 2010-12-31 DIAGNOSIS — K5792 Diverticulitis of intestine, part unspecified, without perforation or abscess without bleeding: Secondary | ICD-10-CM

## 2010-12-31 NOTE — Assessment & Plan Note (Signed)
Resolved, CT scan negative last week, abdominal pain resolved

## 2010-12-31 NOTE — Assessment & Plan Note (Signed)
Improving with elevation and compression hose, will hold off on the diuretics for now and she is to minimize sodium

## 2010-12-31 NOTE — Assessment & Plan Note (Signed)
Improving with increased rest and resting of the GI tract, felt well yesterday and even baby sat her grandkids for awhile today whe is slightly more tired as a result but improved from last week

## 2010-12-31 NOTE — Progress Notes (Signed)
[name] [id number] 10/20/1950 12/31/2010      Progress Note-Follow Up  Subjective  Chief Complaint  Chief Complaint  Patient presents with  . Follow-up    5 day follow up    HPI  Patient is a 60 year old Caucasian female in today for followup. She was seen me last week and had persistent abdominal pain, diarrhea, malaise she was sent for CAT scan and labs were drawn. Fortunately her CAT scan was negative for any acute process and her blood work had normalized. She did not have any fevers or chills over the weekend. No nausea vomiting, anorexia or constipation. She followed a brat diet as recommended and her diarrhea slowed significantly. She's had no chest pain, palpitations, shortness of breath. Her fatigue is improving although it is notable little bit this morning. Yesterday she felt energetic enough to have to watch her grand kids for a couple hours. She has an appointment later this month with endocrinology and agrees to keep it. She is presently not taking her diuretic her blood pressure pills and she's finished her antibiotics. She's not having any significant pedal edema now that she is elevating her feet, minimizing sodium and using compression hose as directed.  Past Medical History  Diagnosis Date  . Anemia   . Anxiety   . Depression   . Diverticulosis   . Elevated cholesterol   . Obesity   . THYROMEGALY 07/22/2010  . Overweight 03/11/2010  . Mixed hyperlipidemia 03/11/2010  . MEASLES, HX OF 03/11/2010  . KNEE PAIN, RIGHT 05/28/2010  . HYPERTRIGLYCERIDEMIA 03/11/2010  . HEART MURMUR, HX OF 03/11/2010  . FIBROIDS, UTERUS 03/11/2010  . Diarrhea 08/05/2010  . CONSTIPATION 03/11/2010  . CHICKENPOX, HX OF 03/11/2010  . BIPOLAR AFFECTIVE DISORDER 03/11/2010  . AORTIC VALVE REPLACEMENT, HX OF 03/11/2010  . ANEMIA 05/28/2010  . Acute bronchitis 07/22/2010  . Abdominal pain, generalized 03/19/2010  . Peripheral edema 11/18/2010  . Bacterial vaginitis 11/18/2010  . Hematuria  12/18/2010  . Diverticulitis 12/18/2010  . Hyperthyroidism 12/18/2010  . Fatigue 12/30/2010  . Renal insufficiency 12/30/2010    Past Surgical History  Procedure Date  . Appendectomy     Required revision for EColi infection, required recurrent packing  . Bowel obstruction     Requiring adhesions to be removed  . Abdominal hysterectomy     sb/l spo, total  . Aortic valve replacement   . Open heart with a cardiac aneurysm repair   . Cholecystectomy   . Tubal ligation   . Varicose vein surgery b/l legs   . Childhood exploratory surgery of heart   . Hernia repair 08-16-10    Family History  Problem Relation Age of Onset  . Scoliosis Mother   . Arthritis Mother     Rheumatoid  . Aneurysm Mother     heart  . Alcohol abuse Father   . Depression Sister   . Depression Brother   . Alcohol abuse Brother   . Cancer Maternal Grandmother     colon  . Diabetes Maternal Grandfather   . Heart disease Maternal Grandfather   . Alcohol abuse Maternal Grandfather   . Depression Paternal Grandmother   . Diabetes Paternal Grandmother   . Depression Paternal Grandfather   . Diabetes Paternal Grandfather   . Depression Sister   . Alcohol abuse Brother   . Depression Brother   . Heart disease Brother     History   Social History  . Marital Status: Married    Spouse Name:  N/A    Number of Children: N/A  . Years of Education: N/A   Occupational History  . Not on file.   Social History Main Topics  . Smoking status: Former Smoker    Quit date: 05/26/1990  . Smokeless tobacco: Never Used  . Alcohol Use: 0.0 oz/week    0 drink(s) per week     very serious occasions  . Drug Use: No  . Sexually Active: No   Other Topics Concern  . Not on file   Social History Narrative  . No narrative on file    Current Outpatient Prescriptions on File Prior to Visit  Medication Sig Dispense Refill  . acetaminophen (TYLENOL) 325 MG tablet Take 650 mg by mouth every 6 (six) hours as needed.         . Acetaminophen-Codeine (TYLENOL/CODEINE #3) 300-30 MG per tablet Take 1 tablet by mouth every 6 (six) hours as needed. Cough/ pain  40 tablet  1  . aspirin 81 MG tablet Take 81 mg by mouth daily.        . clonazePAM (KLONOPIN) 0.5 MG tablet Take 0.5 mg by mouth 2 (two) times daily. 1/2 tab       . DULoxetine (CYMBALTA) 30 MG capsule Take 30 mg by mouth daily.        . fenofibrate 160 MG tablet Take 160 mg by mouth daily.        . Ferrous Fumarate-Folic Acid (HEMOCYTE-F) 324-1 MG TABS Take 1 tablet by mouth daily.  30 each  3  . furosemide (LASIX) 20 MG tablet Take 1 tablet (20 mg total) by mouth daily.  30 tablet  2  . hydrocortisone (ANUSOL-HC) 25 MG suppository 1 supp pr bid x 5 days and then prn rectal bleeding or irritation  24 suppository  1  . Hyoscyamine Sulfate 0.125 MG TBDP Place under the tongue every 4 (four) hours as needed. Every 2-4 hours for abdominal pain      . lactobacillus acidophilus & bulgar (LACTINEX) chewable tablet Chew 2 tablets by mouth 3 (three) times daily with meals.  180 tablet  1  . lamoTRIgine (LAMICTAL) 150 MG tablet Take 150 mg by mouth daily. 1/2 tab       . pravastatin (PRAVACHOL) 40 MG tablet Take 40 mg by mouth at bedtime.        . Probiotic Product (ALIGN) 4 MG CAPS Take 1 capsule by mouth daily.  30 capsule  0  . QUEtiapine (SEROQUEL) 200 MG tablet Take 200 mg by mouth at bedtime. 1-2 tab       . SINGULAIR 10 MG tablet TAKE 1 TABLET EVERY DAY  30 tablet  1  . warfarin (COUMADIN) 5 MG tablet Take 5 mg by mouth daily. 1/2 to 1 tab       . metroNIDAZOLE (FLAGYL) 500 MG tablet Take 1 tablet (500 mg total) by mouth 3 (three) times daily.  42 tablet  0  . senna (SENOKOT) 8.6 MG tablet Take 1 tablet by mouth at bedtime. Five tablets         Allergies  Allergen Reactions  . Sulfonamide Derivatives     Review of Systems  Review of Systems  Constitutional: Positive for malaise/fatigue. Negative for fever and chills.  HENT: Negative for congestion.     Eyes: Negative for discharge.  Respiratory: Negative for shortness of breath.   Cardiovascular: Negative for chest pain, palpitations and leg swelling.  Gastrointestinal: Positive for diarrhea. Negative for heartburn, nausea, vomiting, abdominal  pain, constipation and blood in stool.  Genitourinary: Negative for dysuria.  Musculoskeletal: Negative for falls.  Skin: Negative for rash.  Neurological: Negative for loss of consciousness and headaches.  Endo/Heme/Allergies: Negative for polydipsia.  Psychiatric/Behavioral: Negative for depression and suicidal ideas. The patient is not nervous/anxious and does not have insomnia.     Objective  BP 107/65  Pulse 73  Temp(Src) 98.1 F (36.7 C) (Oral)  Ht 5' 7.25" (1.708 m)  Wt 214 lb 12.8 oz (97.433 kg)  BMI 33.39 kg/m2  SpO2 97%  Physical Exam  Physical Exam  Constitutional: She is oriented to person, place, and time and well-developed, well-nourished, and in no distress. No distress.  HENT:  Head: Normocephalic and atraumatic.  Eyes: Conjunctivae are normal.  Neck: Neck supple. No thyromegaly present.  Cardiovascular: Normal rate and regular rhythm.   Murmur heard.      Systolic click  Pulmonary/Chest: Effort normal and breath sounds normal. She has no wheezes.  Abdominal: She exhibits no distension and no mass.  Musculoskeletal: She exhibits no edema.  Lymphadenopathy:    She has no cervical adenopathy.  Neurological: She is alert and oriented to person, place, and time.  Skin: Skin is warm and dry. No rash noted. She is not diaphoretic.  Psychiatric: Memory, affect and judgment normal.    Lab Results  Component Value Date   TSH 0.01 Repeated and verified X2.* 12/16/2010   Lab Results  Component Value Date   WBC 4.6 12/27/2010   HGB 10.3* 12/27/2010   HCT 30.4* 12/27/2010   MCV 87.9 12/27/2010   PLT 248.0 12/27/2010   Lab Results  Component Value Date   CREATININE 1.1 12/27/2010   BUN 26* 12/27/2010   NA 141 12/27/2010   K 4.5  12/27/2010   CL 108 12/27/2010   CO2 27 12/27/2010   Lab Results  Component Value Date   ALT 24 12/16/2010   AST 27 12/16/2010   ALKPHOS 23* 12/16/2010   BILITOT 0.6 12/16/2010   Lab Results  Component Value Date   CHOL 169 12/16/2010   Lab Results  Component Value Date   HDL 43.40 12/16/2010   No results found for this basename: LDLCALC   Lab Results  Component Value Date   TRIG 244.0* 12/16/2010   Lab Results  Component Value Date   CHOLHDL 4 12/16/2010     Assessment & Plan  Peripheral edema Improving with elevation and compression hose, will hold off on the diuretics for now and she is to minimize sodium  Renal insufficiency Improved with medication changes and good hydration  Hyperthyroidism Has appt with endocrinology soon, is still asymptomatic  Fatigue Improving with increased rest and resting of the GI tract, felt well yesterday and even baby sat her grandkids for awhile today whe is slightly more tired as a result but improved from last week  DIARRHEA Has been following the BRAT diet and her diarrhea has slowed down considerably, may take an occasional Imodium when she has to go out now that we know the stool cultures are negative, slowly liberalize diet  Diverticulitis Resolved, CT scan negative last week, abdominal pain resolved

## 2010-12-31 NOTE — Patient Instructions (Signed)
B.R.A.T. Diet    Your doctor has recommended the B.R.A.T. diet for you or your child until the condition improves. This is often used to help control diarrhea and vomiting symptoms. If you or your child can tolerate clear liquids, you may have:   Bananas.   Rice.   Applesauce.   Toast (and other simple starches such as crackers, potatoes, noodles).    Be sure to avoid dairy products, meats, and fatty foods until symptoms are better.  Fruit juices such as apple, grape, and prune juice can make diarrhea worse. Avoid these. Continue this diet for 2 days or as instructed by your caregiver.    Document Released: 05/12/2005  Document Re-Released: 03/09/2007  [employer] Patient Information 2011 [employer].

## 2010-12-31 NOTE — Assessment & Plan Note (Signed)
Has been following the BRAT diet and her diarrhea has slowed down considerably, may take an occasional Imodium when she has to go out now that we know the stool cultures are negative, slowly liberalize diet

## 2010-12-31 NOTE — Assessment & Plan Note (Signed)
Improved with medication changes and good hydration

## 2010-12-31 NOTE — Assessment & Plan Note (Signed)
Has appt with endocrinology soon, is still asymptomatic

## 2011-01-03 ENCOUNTER — Encounter: Payer: Self-pay | Admitting: Endocrinology

## 2011-01-03 ENCOUNTER — Ambulatory Visit (INDEPENDENT_AMBULATORY_CARE_PROVIDER_SITE_OTHER): Payer: PRIVATE HEALTH INSURANCE | Admitting: Endocrinology

## 2011-01-03 ENCOUNTER — Telehealth: Payer: Self-pay

## 2011-01-03 DIAGNOSIS — E0590 Thyrotoxicosis, unspecified without thyrotoxic crisis or storm: Secondary | ICD-10-CM

## 2011-01-03 MED ORDER — METHIMAZOLE 10 MG PO TABS: ORAL_TABLET | ORAL | Status: DC

## 2011-01-03 NOTE — Telephone Encounter (Signed)
Pt would like a note for missing work from 12-16-10 through 01-05-11. Letter printed and put at front desk. Patient informed letter is ready.

## 2011-01-03 NOTE — Progress Notes (Signed)
Subjective:    Patient ID: [name], female    DOB: September 17, 1950, 60 y.o.   MRN: [id number]  HPI Pt was recently noted on routine labs to have abnormal tsh.  He says in retrospect, she has few mos of slight tremor of the hands, and assoc fatigue. Amiodarone was d/c'ed 1 week ago.   Past Medical History  Diagnosis Date  . Anemia   . Anxiety   . Depression   . Diverticulosis   . Elevated cholesterol   . Obesity   . THYROMEGALY 07/22/2010  . Overweight 03/11/2010  . Mixed hyperlipidemia 03/11/2010  . MEASLES, HX OF 03/11/2010  . KNEE PAIN, RIGHT 05/28/2010  . HYPERTRIGLYCERIDEMIA 03/11/2010  . HEART MURMUR, HX OF 03/11/2010  . FIBROIDS, UTERUS 03/11/2010  . Diarrhea 08/05/2010  . CONSTIPATION 03/11/2010  . CHICKENPOX, HX OF 03/11/2010  . BIPOLAR AFFECTIVE DISORDER 03/11/2010  . AORTIC VALVE REPLACEMENT, HX OF 03/11/2010  . ANEMIA 05/28/2010  . Acute bronchitis 07/22/2010  . Abdominal pain, generalized 03/19/2010  . Peripheral edema 11/18/2010  . Bacterial vaginitis 11/18/2010  . Hematuria 12/18/2010  . Diverticulitis 12/18/2010  . Hyperthyroidism 12/18/2010  . Fatigue 12/30/2010  . Renal insufficiency 12/30/2010    Past Surgical History  Procedure Date  . Appendectomy     Required revision for EColi infection, required recurrent packing  . Bowel obstruction     Requiring adhesions to be removed  . Abdominal hysterectomy     sb/l spo, total  . Aortic valve replacement   . Open heart with a cardiac aneurysm repair   . Cholecystectomy   . Tubal ligation   . Varicose vein surgery b/l legs   . Childhood exploratory surgery of heart   . Hernia repair 08-16-10    History   Social History  . Marital Status: Married    Spouse Name: N/A    Number of Children: N/A  . Years of Education: N/A   Occupational History  . Not on file.   Social History Main Topics  . Smoking status: Former Smoker    Quit date: 05/26/1990  . Smokeless tobacco: Never Used  . Alcohol Use: 0.0 oz/week     0 drink(s) per week     very serious occasions  . Drug Use: No  . Sexually Active: No   Other Topics Concern  . Not on file   Social History Narrative  . No narrative on file    Current Outpatient Prescriptions on File Prior to Visit  Medication Sig Dispense Refill  . acetaminophen (TYLENOL) 325 MG tablet Take 650 mg by mouth every 6 (six) hours as needed.        . Acetaminophen-Codeine (TYLENOL/CODEINE #3) 300-30 MG per tablet Take 1 tablet by mouth every 6 (six) hours as needed. Cough/ pain  40 tablet  1  . aspirin 81 MG tablet Take 81 mg by mouth daily.        . clonazePAM (KLONOPIN) 0.5 MG tablet Take 0.5 mg by mouth 2 (two) times daily. 1/2 tab       . DULoxetine (CYMBALTA) 30 MG capsule Take 30 mg by mouth daily.        . fenofibrate 160 MG tablet Take 160 mg by mouth daily.        . Ferrous Fumarate-Folic Acid (HEMOCYTE-F) 324-1 MG TABS Take 1 tablet by mouth daily.  30 each  3  . furosemide (LASIX) 20 MG tablet Take 1 tablet (20 mg total) by mouth daily.  30  tablet  2  . hydrocortisone (ANUSOL-HC) 25 MG suppository 1 supp pr bid x 5 days and then prn rectal bleeding or irritation  24 suppository  1  . Hyoscyamine Sulfate 0.125 MG TBDP Place under the tongue every 4 (four) hours as needed. Every 2-4 hours for abdominal pain      . lactobacillus acidophilus & bulgar (LACTINEX) chewable tablet Chew 2 tablets by mouth 3 (three) times daily with meals.  180 tablet  1  . lamoTRIgine (LAMICTAL) 150 MG tablet Take 150 mg by mouth daily. 1/2 tab       . pravastatin (PRAVACHOL) 40 MG tablet Take 40 mg by mouth at bedtime.        . Probiotic Product (ALIGN) 4 MG CAPS Take 1 capsule by mouth daily.  30 capsule  0  . QUEtiapine (SEROQUEL) 200 MG tablet Take 200 mg by mouth at bedtime. 1-2 tab       . senna (SENOKOT) 8.6 MG tablet Take 1 tablet by mouth at bedtime. Five tablets       . SINGULAIR 10 MG tablet TAKE 1 TABLET EVERY DAY  30 tablet  1  . warfarin (COUMADIN) 5 MG tablet Take 5  mg by mouth daily. 1/2 to 1 tab         Allergies  Allergen Reactions  . Sulfonamide Derivatives     Family History  Problem Relation Age of Onset  . Scoliosis Mother   . Arthritis Mother     Rheumatoid  . Aneurysm Mother     heart  . Alcohol abuse Father   . Depression Sister   . Depression Brother   . Alcohol abuse Brother   . Cancer Maternal Grandmother     colon  . Diabetes Maternal Grandfather   . Heart disease Maternal Grandfather   . Alcohol abuse Maternal Grandfather   . Depression Paternal Grandmother   . Diabetes Paternal Grandmother   . Depression Paternal Grandfather   . Diabetes Paternal Grandfather   . Depression Sister   . Alcohol abuse Brother   . Depression Brother   . Heart disease Brother   no goiter or other thyroid probs.  BP 126/66  Pulse 69  Temp(Src) 98.4 F (36.9 C) (Oral)  Ht 5\' 7"  (1.702 m)  Wt 211 lb 12.8 oz (96.072 kg)  BMI 33.17 kg/m2  SpO2 99%    Review of Systems denies weight loss, headache, hoarseness, double vision, palpitations, sob, diarrhea, polyuria, myalgias, numbness, menopausal sxs, and hypoglycemia.  She has excessive diaphoresis, rhinorrhea, and anxiety.  No seizure.   She attributes easy bruising to coumadin.     Objective:   Physical Exam VS: see vs page GEN: no distress HEAD: head: no deformity eyes: no periorbital swelling, no proptosis external nose and ears are normal mouth: no lesion seen NECK: thyroid is not enlarged, but has a slightly irreg surface CHEST WALL: no deformity.  There is a healed sternotomy scar CV: reg rate and rhythm.  There is a soft systolic murmur, and a valve click ABD: abdomen is soft, nontender.  no hepatosplenomegaly.  not distended.  no hernia MUSCULOSKELETAL: muscle bulk and strength are grossly normal.  no obvious joint swelling.  gait is normal and steady EXTEMITIES: no deformity.  no ulcer on the feet.  feet are of normal color and temp.  no edema PULSES: dorsalis pedis  intact bilat.  no carotid bruit NEURO:  cn 2-12 grossly intact.   readily moves all 4's.  sensation is  intact to touch on the feet SKIN:  Normal texture and temperature.  No rash or suspicious lesion is visible.   NODES:  None palpable at the neck PSYCH: alert, oriented x3.  Does not appear anxious nor depressed.   outside test results are reviewed: Lab Results  Component Value Date   TSH 0.01 Repeated and verified X2.* 12/16/2010   Thyroid ultrasound: normal     Assessment & Plan:  Hyperthyroidism, in the setting of recent amioradone rx.  She prob has underlying autoimmune thyroid dz. Atrial fibrillation.  She is at risk for an interaction between tapazole and coumadin.  Due to the recent amiodarone rx, i-131 is of no value.   Anxiety, caused or exac by hyperthyroidism.   Renal insuff.  Tapazole is safe here

## 2011-01-03 NOTE — Patient Instructions (Addendum)
i have sent a prescription to your pharmacy, for a pill to slow down your thyroid. Because of the interaction between methimazole and coumadin, you must have the coumadin rechecked within 4 days.   Please make a follow-up appointment in 3 weeks. if ever you have fever while taking methimazole, stop it and call us, because of the risk of a rare side-effect.

## 2011-01-09 ENCOUNTER — Telehealth: Payer: Self-pay

## 2011-01-09 NOTE — Telephone Encounter (Signed)
Patient informed. 

## 2011-01-09 NOTE — Telephone Encounter (Signed)
Pt left a message stating she had a terrible day yesterday? Pt stated she was so tired and since she has been up today she feels like she is starting to feel the same way?  Please advise?

## 2011-01-09 NOTE — Telephone Encounter (Signed)
Not sure what to tell her. She probably needs to rest more and maybe not work for a couple days, if she gets bad enough she will need to get looked at. I am out until Monday. She may need to see someone else or go to ER if it gets bad enough, I can always see her next week. If the abdominal pain is worsening again she needs to call GI for further evaluation as well

## 2011-01-13 ENCOUNTER — Ambulatory Visit: Payer: PRIVATE HEALTH INSURANCE | Admitting: Endocrinology

## 2011-01-14 ENCOUNTER — Encounter: Payer: Self-pay | Admitting: Family Medicine

## 2011-01-14 ENCOUNTER — Ambulatory Visit (INDEPENDENT_AMBULATORY_CARE_PROVIDER_SITE_OTHER): Payer: PRIVATE HEALTH INSURANCE | Admitting: Family Medicine

## 2011-01-14 VITALS — BP 109/65 | HR 72 | Temp 98.2°F | Ht 67.25 in | Wt 211.4 lb

## 2011-01-14 DIAGNOSIS — E0500 Thyrotoxicosis with diffuse goiter without thyrotoxic crisis or storm: Secondary | ICD-10-CM

## 2011-01-14 DIAGNOSIS — K5732 Diverticulitis of large intestine without perforation or abscess without bleeding: Secondary | ICD-10-CM

## 2011-01-14 DIAGNOSIS — R5381 Other malaise: Secondary | ICD-10-CM

## 2011-01-14 DIAGNOSIS — D649 Anemia, unspecified: Secondary | ICD-10-CM

## 2011-01-14 DIAGNOSIS — N289 Disorder of kidney and ureter, unspecified: Secondary | ICD-10-CM

## 2011-01-14 DIAGNOSIS — K5792 Diverticulitis of intestine, part unspecified, without perforation or abscess without bleeding: Secondary | ICD-10-CM

## 2011-01-14 DIAGNOSIS — R600 Localized edema: Secondary | ICD-10-CM

## 2011-01-14 DIAGNOSIS — R609 Edema, unspecified: Secondary | ICD-10-CM

## 2011-01-14 DIAGNOSIS — R5383 Other fatigue: Secondary | ICD-10-CM

## 2011-01-14 LAB — CBC WITH DIFFERENTIAL/PLATELET
Basophils Absolute: 0 10*3/uL (ref 0.0–0.1)
Basophils Relative: 1.3 % (ref 0.0–3.0)
Eosinophils Absolute: 0.1 10*3/uL (ref 0.0–0.7)
Eosinophils Relative: 2.9 % (ref 0.0–5.0)
HCT: 29.6 % — ABNORMAL LOW (ref 36.0–46.0)
Hemoglobin: 9.9 g/dL — ABNORMAL LOW (ref 12.0–15.0)
Lymphocytes Relative: 34.4 % (ref 12.0–46.0)
Lymphs Abs: 1.2 10*3/uL (ref 0.7–4.0)
MCHC: 33.5 g/dL (ref 30.0–36.0)
MCV: 86.4 fl (ref 78.0–100.0)
Monocytes Absolute: 0.4 10*3/uL (ref 0.1–1.0)
Monocytes Relative: 11.7 % (ref 3.0–12.0)
Neutro Abs: 1.7 10*3/uL (ref 1.4–7.7)
Neutrophils Relative %: 49.7 % (ref 43.0–77.0)
Platelets: 193 10*3/uL (ref 150.0–400.0)
RBC: 3.42 Mil/uL — ABNORMAL LOW (ref 3.87–5.11)
RDW: 12.9 % (ref 11.5–14.6)
WBC: 3.5 10*3/uL — ABNORMAL LOW (ref 4.5–10.5)

## 2011-01-14 NOTE — Patient Instructions (Signed)
Anemia and Aging   Hemoglobin is the protein in your blood that carries oxygen. If this protein becomes too low, you are anemic. Usually mild anemia (low red blood cells) may not be noticeable. However, if it becomes more severe, you may feel tired, short of breath with activity, and may experience fainting or palpitations (heart beats you can feel). As many as 61% of elderly people have anemia. This depends on age, sex, and overall health. The chance of becoming anemic increases after age 60 and is greater in men.  CAUSES  There are many causes of anemia. Studies have shown the 2 major causes of anemia in the elderly are:    Chronic (long standing) disease.    Anemia of chronic disease: Certain chronic diseases can interfere with the production of red blood cells, resulting in chronic anemia. The kidneys produce a hormone called erythropoietin which stimulates your bone marrow to produce red blood cells. Bone marrow is the soft, spongy tissue found in bone cavities which makes and stores most of the red blood cells.    Iron is also needed for red blood cell production. In anemia of chronic disease a body can not use its stored iron. Erythropoietin is suppressed (held down from working) and the bone marrow does not respond normally. A shortage of iron and erythropoietin can result in a shortage of red blood cells.    Iron deficiency.    Iron deficiency anemia can result from chronic, often undetected blood loss. Another cause is from not eating enough iron in your diet, or when your body is not absorbing enough iron. About 20% to 36% of elderly people have idiopathic anemia. This means no cause for the anemia can be found.   EFFECTS OF UNTREATED ANEMIA  Elderly people with anemia are 40% more likely to have problems that keep them from being independent. Some of these problems include:   Poor balance.    Falling more often.    Heart trouble.    Depression.    Problems with memory and concentration.    Not  being able to walk long distances.   Anemia has been shown to shorten the life expectancy of elderly people. As early as age 60, people with kidney disease, diabetes, and/or heart failure are more likely to die if they have anemia. Anemia can also make certain medical conditions worse. Getting treatment for anemia is important. This does not guarantee you will live longer if the anemia is corrected, but it this information shows how important it is to treat your anemia.   SYMPTOMS  Most people do not realize they are anemic until a blood test is done. Symptoms and signs usually develop when anemia is moderate to severe. Symptoms can include:   Fatigue.   Weakness.    Pale skin.    Chest pain.    Dizziness.   Irritability.   Trouble breathing.    A fast heartbeat.    Headaches.    Numbness or coldness in your hands and feet.    One of the problems in identifying anemia in the elderly is that many of these symptoms are typically associated with getting older. It is important to see your caregiver on a regular basis in order to be tested for anemia.   TREATMENT  Your caregiver will provide the treatment that is best for you based on what is causing the anemia. Several medications are approved to help correct anemia. Close communication with your caregiver will help you   to receive the best anemia treatment available.   Normal Lab Values:   Normal hemoglobin is greater than 12 g/dL for women, with a hematocrit which is greater than 37%.  Normal hemoglobin is greater than 14 g/dL for men, with a normal hematocrit which is greater than 42%.  Document Released: 12/19/2003 Document Re-Released: 10/30/2009  [employer] Patient Information 2011 [employer].

## 2011-01-17 ENCOUNTER — Encounter: Payer: Self-pay | Admitting: Family Medicine

## 2011-01-17 ENCOUNTER — Ambulatory Visit: Payer: PRIVATE HEALTH INSURANCE | Admitting: Gastroenterology

## 2011-01-17 ENCOUNTER — Ambulatory Visit: Payer: PRIVATE HEALTH INSURANCE | Admitting: Family Medicine

## 2011-01-17 DIAGNOSIS — E0500 Thyrotoxicosis with diffuse goiter without thyrotoxic crisis or storm: Secondary | ICD-10-CM | POA: Insufficient documentation

## 2011-01-17 NOTE — Assessment & Plan Note (Signed)
Has established now with Endocrinology and is actively being treated, has no concerning SE with the Tapazole and is scheduled to return for further evaluation and treatment soon. No changes to therapy today

## 2011-01-17 NOTE — Assessment & Plan Note (Signed)
Continue Hemocyte daily, CBC today shows a slight drop in hgb but relatively stable, will continue to monitor and patient is aware of changes in stooling and symptoms to watch for and seek help

## 2011-01-17 NOTE — Progress Notes (Signed)
[name] [id number] 1951-02-20 01/17/2011      Progress Note-Follow Up  Subjective  Chief Complaint  Chief Complaint  Patient presents with  . Follow-up    2 week follow up    HPI  Patient is in today for follow up. Her biggest persistent c/o is fatigue. She reports her abdominal pains, chill, fevers are resolved. Her bowels are moving most days with out any bloody or tarry stool No CP/palp/SOB/GI or GU c/o. She has seen Endocrine and had dx of Grave's disease established, is tolerating Tapazole. No worsening depression or pain despite her new diagnosis.  Past Medical History  Diagnosis Date  . Anemia   . Anxiety   . Depression   . Diverticulosis   . Elevated cholesterol   . Obesity   . THYROMEGALY 07/22/2010  . Overweight 03/11/2010  . Mixed hyperlipidemia 03/11/2010  . MEASLES, HX OF 03/11/2010  . KNEE PAIN, RIGHT 05/28/2010  . HYPERTRIGLYCERIDEMIA 03/11/2010  . HEART MURMUR, HX OF 03/11/2010  . FIBROIDS, UTERUS 03/11/2010  . Diarrhea 08/05/2010  . CONSTIPATION 03/11/2010  . CHICKENPOX, HX OF 03/11/2010  . BIPOLAR AFFECTIVE DISORDER 03/11/2010  . AORTIC VALVE REPLACEMENT, HX OF 03/11/2010  . ANEMIA 05/28/2010  . Acute bronchitis 07/22/2010  . Abdominal pain, generalized 03/19/2010  . Peripheral edema 11/18/2010  . Bacterial vaginitis 11/18/2010  . Hematuria 12/18/2010  . Diverticulitis 12/18/2010  . Hyperthyroidism 12/18/2010  . Fatigue 12/30/2010  . Renal insufficiency 12/30/2010  . Grave's disease 8-12    Past Surgical History  Procedure Date  . Appendectomy     Required revision for EColi infection, required recurrent packing  . Bowel obstruction     Requiring adhesions to be removed  . Abdominal hysterectomy     sb/l spo, total  . Aortic valve replacement   . Open heart with a cardiac aneurysm repair   . Cholecystectomy   . Tubal ligation   . Varicose vein surgery b/l legs   . Childhood exploratory surgery of heart   . Hernia repair 08-16-10    Family  History  Problem Relation Age of Onset  . Scoliosis Mother   . Arthritis Mother     Rheumatoid  . Aneurysm Mother     heart  . Alcohol abuse Father   . Depression Sister   . Depression Brother   . Alcohol abuse Brother   . Cancer Maternal Grandmother     colon  . Diabetes Maternal Grandfather   . Heart disease Maternal Grandfather   . Alcohol abuse Maternal Grandfather   . Depression Paternal Grandmother   . Diabetes Paternal Grandmother   . Depression Paternal Grandfather   . Diabetes Paternal Grandfather   . Depression Sister   . Alcohol abuse Brother   . Depression Brother   . Heart disease Brother     History   Social History  . Marital Status: Married    Spouse Name: N/A    Number of Children: N/A  . Years of Education: N/A   Occupational History  . Not on file.   Social History Main Topics  . Smoking status: Former Smoker    Quit date: 05/26/1990  . Smokeless tobacco: Never Used  . Alcohol Use: 0.0 oz/week    0 drink(s) per week     very serious occasions  . Drug Use: No  . Sexually Active: No   Other Topics Concern  . Not on file   Social History Narrative  . No narrative on file  Current Outpatient Prescriptions on File Prior to Visit  Medication Sig Dispense Refill  . acetaminophen (TYLENOL) 325 MG tablet Take 650 mg by mouth every 6 (six) hours as needed.        . Acetaminophen-Codeine (TYLENOL/CODEINE #3) 300-30 MG per tablet Take 1 tablet by mouth every 6 (six) hours as needed. Cough/ pain  40 tablet  1  . aspirin 81 MG tablet Take 81 mg by mouth daily.        . clonazePAM (KLONOPIN) 0.5 MG tablet Take 0.5 mg by mouth 2 (two) times daily. 1/2 tab       . DULoxetine (CYMBALTA) 30 MG capsule Take 30 mg by mouth daily.        . fenofibrate 160 MG tablet Take 160 mg by mouth daily.        . Ferrous Fumarate-Folic Acid (HEMOCYTE-F) 324-1 MG TABS Take 1 tablet by mouth daily.  30 each  3  . furosemide (LASIX) 20 MG tablet Take 1 tablet (20 mg  total) by mouth daily.  30 tablet  2  . hydrocortisone (ANUSOL-HC) 25 MG suppository 1 supp pr bid x 5 days and then prn rectal bleeding or irritation  24 suppository  1  . Hyoscyamine Sulfate 0.125 MG TBDP Place under the tongue every 4 (four) hours as needed. Every 2-4 hours for abdominal pain      . lactobacillus acidophilus & bulgar (LACTINEX) chewable tablet Chew 2 tablets by mouth 3 (three) times daily with meals.  180 tablet  1  . lamoTRIgine (LAMICTAL) 150 MG tablet Take 150 mg by mouth daily. 1/2 tab       . methimazole (TAPAZOLE) 10 MG tablet 2 tabs 2x a day  120 tablet  1  . pravastatin (PRAVACHOL) 40 MG tablet Take 40 mg by mouth at bedtime.        . Probiotic Product (ALIGN) 4 MG CAPS Take 1 capsule by mouth daily.  30 capsule  0  . QUEtiapine (SEROQUEL) 200 MG tablet Take 200 mg by mouth at bedtime. 1-2 tab       . senna (SENOKOT) 8.6 MG tablet Take 1 tablet by mouth at bedtime. Five tablets       . SINGULAIR 10 MG tablet TAKE 1 TABLET EVERY DAY  30 tablet  1  . warfarin (COUMADIN) 5 MG tablet Take 5 mg by mouth daily. 1/2 to 1 tab         Allergies  Allergen Reactions  . Sulfonamide Derivatives     Review of Systems  Review of Systems  Constitutional: Positive for malaise/fatigue. Negative for fever.  HENT: Negative for congestion.   Eyes: Negative for discharge.  Respiratory: Negative for shortness of breath.   Cardiovascular: Negative for chest pain, palpitations and leg swelling.  Gastrointestinal: Negative for nausea, abdominal pain and diarrhea.  Genitourinary: Negative for dysuria.  Musculoskeletal: Negative for falls.  Skin: Negative for rash.  Neurological: Negative for loss of consciousness and headaches.  Endo/Heme/Allergies: Negative for polydipsia.  Psychiatric/Behavioral: Negative for depression and suicidal ideas. The patient is not nervous/anxious and does not have insomnia.     Objective  BP 109/65  Pulse 72  Temp(Src) 98.2 F (36.8 C) (Oral)   Ht 5' 7.25" (1.708 m)  Wt 211 lb 6.4 oz (95.89 kg)  BMI 32.86 kg/m2  SpO2 98%  Physical Exam  Physical Exam  Constitutional: She is oriented to person, place, and time and well-developed, well-nourished, and in no distress. No distress.  HENT:  Head: Normocephalic and atraumatic.  Eyes: Conjunctivae are normal.  Neck: Neck supple. No thyromegaly present.  Cardiovascular: Normal rate and regular rhythm.   Murmur heard.      2/6 sys murmur,click  Pulmonary/Chest: Effort normal and breath sounds normal. She has no wheezes.  Abdominal: She exhibits no distension and no mass.  Musculoskeletal: She exhibits no edema.  Lymphadenopathy:    She has no cervical adenopathy.  Neurological: She is alert and oriented to person, place, and time.  Skin: Skin is warm and dry. No rash noted. She is not diaphoretic.  Psychiatric: Memory, affect and judgment normal.    Lab Results  Component Value Date   TSH 0.01 Repeated and verified X2.* 12/16/2010   Lab Results  Component Value Date   WBC 3.5* 01/14/2011   HGB 9.9* 01/14/2011   HCT 29.6* 01/14/2011   MCV 86.4 01/14/2011   PLT 193.0 01/14/2011   Lab Results  Component Value Date   CREATININE 1.1 12/27/2010   BUN 26* 12/27/2010   NA 141 12/27/2010   K 4.5 12/27/2010   CL 108 12/27/2010   CO2 27 12/27/2010   Lab Results  Component Value Date   ALT 24 12/16/2010   AST 27 12/16/2010   ALKPHOS 23* 12/16/2010   BILITOT 0.6 12/16/2010   Lab Results  Component Value Date   CHOL 169 12/16/2010   Lab Results  Component Value Date   HDL 43.40 12/16/2010   No results found for this basename: [facility]   Lab Results  Component Value Date   TRIG 244.0* 12/16/2010   Lab Results  Component Value Date   CHOLHDL 4 12/16/2010     Assessment & Plan  Grave's disease Has established now with Endocrinology and is actively being treated, has no concerning SE with the Tapazole and is scheduled to return for further evaluation and treatment soon. No changes to  therapy today  Peripheral edema Improved with increased activity  Renal insufficiency Improved with repeat lab work, maintain adequate hydration  Diverticulitis Active infection has improved. Encouraged her maintain a bland diet and good hydration with plenty of soluble fiber in her diet and to call GI or here if symptoms worsen once again  Fatigue Persistent fatigue is her biggest complaint. She does note that before she went back to work this had been improving. She is encouraged to take it easy a while longer and to rest as much as needed

## 2011-01-17 NOTE — Assessment & Plan Note (Signed)
Persistent fatigue is her biggest complaint. She does note that before she went back to work this had been improving. She is encouraged to take it easy a while longer and to rest as much as needed

## 2011-01-17 NOTE — Assessment & Plan Note (Signed)
Active infection has improved. Encouraged her maintain a bland diet and good hydration with plenty of soluble fiber in her diet and to call GI or here if symptoms worsen once again

## 2011-01-17 NOTE — Assessment & Plan Note (Signed)
Improved with increased activity. 

## 2011-01-17 NOTE — Assessment & Plan Note (Signed)
Improved with repeat lab work, maintain adequate hydration

## 2011-01-24 ENCOUNTER — Encounter: Payer: Self-pay | Admitting: Endocrinology

## 2011-01-24 ENCOUNTER — Ambulatory Visit (INDEPENDENT_AMBULATORY_CARE_PROVIDER_SITE_OTHER): Payer: PRIVATE HEALTH INSURANCE | Admitting: Endocrinology

## 2011-01-24 ENCOUNTER — Other Ambulatory Visit (INDEPENDENT_AMBULATORY_CARE_PROVIDER_SITE_OTHER): Payer: PRIVATE HEALTH INSURANCE

## 2011-01-24 VITALS — BP 138/72 | HR 74 | Temp 98.1°F | Ht 67.0 in | Wt 214.0 lb

## 2011-01-24 DIAGNOSIS — E0590 Thyrotoxicosis, unspecified without thyrotoxic crisis or storm: Secondary | ICD-10-CM

## 2011-01-24 LAB — TSH: TSH: 0.01 u[IU]/mL — ABNORMAL LOW (ref 0.35–5.50)

## 2011-01-24 LAB — T4, FREE: Free T4: 3.22 ng/dL — ABNORMAL HIGH (ref 0.60–1.60)

## 2011-01-24 MED ORDER — METHIMAZOLE 10 MG PO TABS: ORAL_TABLET | ORAL | Status: DC

## 2011-01-24 NOTE — Progress Notes (Signed)
Subjective:    Patient ID: [name], female    DOB: 13-Feb-1951, 60 y.o.   MRN: [id number]  HPI Pt returns for f/u of hyperthyroidism, in the setting of recent amioradone rx.  Her coumadin has been adjusted since she has been on the tapazole.  She says she still has diffuse tremulousness.   Past Medical History  Diagnosis Date  . Anemia   . Anxiety   . Depression   . Diverticulosis   . Elevated cholesterol   . Obesity   . THYROMEGALY 07/22/2010  . Overweight 03/11/2010  . Mixed hyperlipidemia 03/11/2010  . MEASLES, HX OF 03/11/2010  . KNEE PAIN, RIGHT 05/28/2010  . HYPERTRIGLYCERIDEMIA 03/11/2010  . HEART MURMUR, HX OF 03/11/2010  . FIBROIDS, UTERUS 03/11/2010  . Diarrhea 08/05/2010  . CONSTIPATION 03/11/2010  . CHICKENPOX, HX OF 03/11/2010  . BIPOLAR AFFECTIVE DISORDER 03/11/2010  . AORTIC VALVE REPLACEMENT, HX OF 03/11/2010  . ANEMIA 05/28/2010  . Acute bronchitis 07/22/2010  . Abdominal pain, generalized 03/19/2010  . Peripheral edema 11/18/2010  . Bacterial vaginitis 11/18/2010  . Hematuria 12/18/2010  . Diverticulitis 12/18/2010  . Hyperthyroidism 12/18/2010  . Fatigue 12/30/2010  . Renal insufficiency 12/30/2010  . Grave's disease 8-12    Past Surgical History  Procedure Date  . Appendectomy     Required revision for EColi infection, required recurrent packing  . Bowel obstruction     Requiring adhesions to be removed  . Abdominal hysterectomy     sb/l spo, total  . Aortic valve replacement   . Open heart with a cardiac aneurysm repair   . Cholecystectomy   . Tubal ligation   . Varicose vein surgery b/l legs   . Childhood exploratory surgery of heart   . Hernia repair 08-16-10    History   Social History  . Marital Status: Married    Spouse Name: N/A    Number of Children: N/A  . Years of Education: N/A   Occupational History  . Not on file.   Social History Main Topics  . Smoking status: Former Smoker    Quit date: 05/26/1990  . Smokeless tobacco: Never  Used  . Alcohol Use: 0.0 oz/week    0 drink(s) per week     very serious occasions  . Drug Use: No  . Sexually Active: No   Other Topics Concern  . Not on file   Social History Narrative  . No narrative on file    Current Outpatient Prescriptions on File Prior to Visit  Medication Sig Dispense Refill  . acetaminophen (TYLENOL) 325 MG tablet Take 650 mg by mouth every 6 (six) hours as needed.        . Acetaminophen-Codeine (TYLENOL/CODEINE #3) 300-30 MG per tablet Take 1 tablet by mouth every 6 (six) hours as needed. Cough/ pain  40 tablet  1  . aspirin 81 MG tablet Take 81 mg by mouth daily.        . clonazePAM (KLONOPIN) 0.5 MG tablet Take 0.5 mg by mouth 2 (two) times daily. 1/2 tab       . DULoxetine (CYMBALTA) 30 MG capsule Take 30 mg by mouth daily.        . fenofibrate 160 MG tablet Take 160 mg by mouth daily.        . Ferrous Fumarate-Folic Acid (HEMOCYTE-F) 324-1 MG TABS Take 1 tablet by mouth daily.  30 each  3  . furosemide (LASIX) 20 MG tablet Take 1 tablet (20 mg total) by  mouth daily.  30 tablet  2  . hydrocortisone (ANUSOL-HC) 25 MG suppository 1 supp pr bid x 5 days and then prn rectal bleeding or irritation  24 suppository  1  . Hyoscyamine Sulfate 0.125 MG TBDP Place under the tongue every 4 (four) hours as needed. Every 2-4 hours for abdominal pain      . lactobacillus acidophilus & bulgar (LACTINEX) chewable tablet Chew 2 tablets by mouth 3 (three) times daily with meals.  180 tablet  1  . lamoTRIgine (LAMICTAL) 150 MG tablet Take 150 mg by mouth daily. 1/2 tab       . pravastatin (PRAVACHOL) 40 MG tablet Take 40 mg by mouth at bedtime.        . Probiotic Product (ALIGN) 4 MG CAPS Take 1 capsule by mouth daily.  30 capsule  0  . QUEtiapine (SEROQUEL) 200 MG tablet Take 200 mg by mouth at bedtime. 1-2 tab       . senna (SENOKOT) 8.6 MG tablet Take 1 tablet by mouth at bedtime. Five tablets       . SINGULAIR 10 MG tablet TAKE 1 TABLET EVERY DAY  30 tablet  1  .  warfarin (COUMADIN) 5 MG tablet Take 5 mg by mouth daily. 1/2 to 1 tab         Allergies  Allergen Reactions  . Sulfonamide Derivatives     Family History  Problem Relation Age of Onset  . Scoliosis Mother   . Arthritis Mother     Rheumatoid  . Aneurysm Mother     heart  . Alcohol abuse Father   . Depression Sister   . Depression Brother   . Alcohol abuse Brother   . Cancer Maternal Grandmother     colon  . Diabetes Maternal Grandfather   . Heart disease Maternal Grandfather   . Alcohol abuse Maternal Grandfather   . Depression Paternal Grandmother   . Diabetes Paternal Grandmother   . Depression Paternal Grandfather   . Diabetes Paternal Grandfather   . Depression Sister   . Alcohol abuse Brother   . Depression Brother   . Heart disease Brother     BP 138/72  Pulse 74  Temp(Src) 98.1 F (36.7 C) (Oral)  Ht 5\' 7"  (1.702 m)  Wt 214 lb (97.07 kg)  BMI 33.52 kg/m2  SpO2 97%   Review of Systems Denies tremor    Objective:   Physical Exam VITAL SIGNS:  See vs page GENERAL: no distress Thyroid: slightly and diffusely enlarged.  No nodule.   Skin: slightly diaphoretic Neuro: slight tremor of the hands.   Lab Results  Component Value Date   TSH 0.01* 01/24/2011  free t4 is unchanged     Assessment & Plan:  Hyperthyroidism, not better yet

## 2011-01-24 NOTE — Patient Instructions (Addendum)
blood tests are being requested for you today.  please call [phone] to hear your test results.  You will be prompted to enter the 9-digit "MRN" number that appears at the top left of this page, followed by #.  Then you will hear the message.  Please make a follow-up appointment in 1 month. if ever you have fever while taking methimazole, stop it and call us, because of the risk of a rare side-effect.  (update: i left message on phone-tree: increase tapazole to 40 mg bid)

## 2011-02-05 ENCOUNTER — Encounter: Payer: Self-pay | Admitting: Gastroenterology

## 2011-02-05 ENCOUNTER — Ambulatory Visit (INDEPENDENT_AMBULATORY_CARE_PROVIDER_SITE_OTHER): Payer: PRIVATE HEALTH INSURANCE | Admitting: Gastroenterology

## 2011-02-05 DIAGNOSIS — K5900 Constipation, unspecified: Secondary | ICD-10-CM

## 2011-02-05 NOTE — Patient Instructions (Addendum)
Please call Dr. Christella Hartigan' office with GI symptoms, we will get you an appointment  expeditiously.

## 2011-02-05 NOTE — Progress Notes (Signed)
Review of gastrointestinal problems:  1. Routine risk for colon cancer: Colonoscopy 01/2004 [place], [place] Dr. Raphael Gibney; small hyperplastic polyp in rectum removed, tortuous colon, left sided tics. Colonoscopy 03/2007 [place] Dr. Raphael Gibney; no polyps, +tics, +hemorrhoids. Next recall colonoscopy 03/2017.  2. Chronic constipation: On multiple laxatives, stool softeners for years; December 2011 started amitza at 8 micrograms twice daily, slow improvement. January 2011 increased Amitiza To 24 micrograms twice daily   HPI: This is a very pleasant 60 year old woman who is here with her husband today.  She stopped taking amitiza about 3 months ago.  She went away in July.  She was not seeing overt blood but routine physical in July and she had FOBT positive stool.    She tells me she was very fatigued.    Her memory is problematic lately.  She doesn't remember if she has had diarrhea or not.  Her husband is with her to help with history.  Was having a lot of diarrhea for a while.  Has been off amitiza, off senekot.  Diarrhea came after cipro/flagyl for presumptive diverticulitis.  She doesn't seem very bothered by any GI symptoms lately.  Her husband says her bowels are NORMAL lately.  He is most concerned about 'hyperactive thyroid currently."    Past Medical History:   Anemia                                                       Anxiety                                                      Depression                                                   Diverticulosis                                               Elevated cholesterol                                         Obesity                                                      THYROMEGALY                                     07/22/2010    Overweight  03/11/2010   Mixed hyperlipidemia                            03/11/2010   MEASLES, HX OF                                  03/11/2010   KNEE PAIN, RIGHT                                 05/28/2010     HYPERTRIGLYCERIDEMIA                            03/11/2010   HEART MURMUR, HX OF                             03/11/2010   FIBROIDS, UTERUS                                03/11/2010   Diarrhea                                        08/05/2010    CONSTIPATION                                    03/11/2010   CHICKENPOX, HX OF                               03/11/2010   BIPOLAR AFFECTIVE DISORDER                      03/11/2010   AORTIC VALVE REPLACEMENT, HX OF                 03/11/2010   ANEMIA                                          05/28/2010     Acute bronchitis                                07/22/2010    Abdominal pain, generalized                     03/19/2010   Peripheral edema                                11/18/2010    Bacterial vaginitis                             11/18/2010    Hematuria  12/18/2010    Diverticulitis                                  12/18/2010    Hyperthyroidism                                 12/18/2010    Fatigue                                         12/30/2010     Renal insufficiency                             12/30/2010     Grave's disease                                 8-12        Past Surgical History:   APPENDECTOMY                                                   Comment:Required revision for EColi infection, required              recurrent packing   bowel obstruction                                              Comment:Requiring adhesions to be removed   ABDOMINAL HYSTERECTOMY                                         Comment:sb/l spo, total   AORTIC VALVE REPLACEMENT                                     open heart with a cardiac aneurysm repair                    CHOLECYSTECTOMY                                              TUBAL LIGATION                                               varicose vein surgery b/l legs                               childhood exploratory surgery of heart  HERNIA REPAIR                                   08-16-10      reports that she quit smoking about 20 years ago. She has never used smokeless tobacco. She reports that she drinks alcohol. She reports that she does not use illicit drugs.  family history includes Alcohol abuse in her brothers, father, and maternal grandfather; Aneurysm in her mother; Arthritis in her mother; Cancer in her maternal grandmother; Depression in her brothers, paternal grandfather, paternal grandmother, and sisters; Diabetes in her maternal grandfather, paternal grandfather, and paternal grandmother; Heart disease in her brother and maternal grandfather; and Scoliosis in her mother.    Current medicines and allergies were reviewed in [facility]    Physical Exam: BP 128/60  Pulse 72  Ht 5\' 7"  (1.702 m)  Wt 209 lb (94.802 kg)  BMI 32.73 kg/m2 Constitutional: generally well-appearing Psychiatric: alert and oriented x3 Abdomen: soft, nontender, nondistended, no obvious ascites, no peritoneal signs, normal bowel sounds     Assessment and plan: 60 y.o. female with  previous constipation, diarrhea, previous abdominal pains  Her memory is not very good. Her husband helps with her history today. Overall it seems like she was bothered by some gastrointestinal symptoms several weeks, months ago but these all seem to be resolving. She no longer requires medicines to help her over bowels. She had some minor lower abdominal pains, a CT scan done all August was normal. She was empirically treated for diverticulitis with Cipro and Flagyl. Perhaps she indeed had diverticulitis. I think for now I want to see how she does clinically. She will call if she has return of her symptoms get her fitted with an expeditious appointment.

## 2011-02-13 ENCOUNTER — Encounter: Payer: Self-pay | Admitting: Family Medicine

## 2011-02-13 ENCOUNTER — Ambulatory Visit (INDEPENDENT_AMBULATORY_CARE_PROVIDER_SITE_OTHER): Payer: PRIVATE HEALTH INSURANCE | Admitting: Family Medicine

## 2011-02-13 DIAGNOSIS — Z8679 Personal history of other diseases of the circulatory system: Secondary | ICD-10-CM

## 2011-02-13 DIAGNOSIS — R5383 Other fatigue: Secondary | ICD-10-CM

## 2011-02-13 DIAGNOSIS — E0590 Thyrotoxicosis, unspecified without thyrotoxic crisis or storm: Secondary | ICD-10-CM

## 2011-02-13 DIAGNOSIS — D649 Anemia, unspecified: Secondary | ICD-10-CM

## 2011-02-13 DIAGNOSIS — R1030 Lower abdominal pain, unspecified: Secondary | ICD-10-CM

## 2011-02-13 DIAGNOSIS — R5381 Other malaise: Secondary | ICD-10-CM

## 2011-02-13 DIAGNOSIS — R1084 Generalized abdominal pain: Secondary | ICD-10-CM

## 2011-02-13 DIAGNOSIS — R109 Unspecified abdominal pain: Secondary | ICD-10-CM

## 2011-02-13 DIAGNOSIS — Z23 Encounter for immunization: Secondary | ICD-10-CM

## 2011-02-13 DIAGNOSIS — N289 Disorder of kidney and ureter, unspecified: Secondary | ICD-10-CM

## 2011-02-13 DIAGNOSIS — R609 Edema, unspecified: Secondary | ICD-10-CM

## 2011-02-13 DIAGNOSIS — R319 Hematuria, unspecified: Secondary | ICD-10-CM

## 2011-02-13 DIAGNOSIS — K5900 Constipation, unspecified: Secondary | ICD-10-CM

## 2011-02-13 LAB — RENAL FUNCTION PANEL
Albumin: 3.7 g/dL (ref 3.5–5.2)
BUN: 21 mg/dL (ref 6–23)
CO2: 24 mEq/L (ref 19–32)
Calcium: 8.9 mg/dL (ref 8.4–10.5)
Chloride: 108 mEq/L (ref 96–112)
Creatinine, Ser: 0.9 mg/dL (ref 0.4–1.2)
GFR: 66.14 mL/min (ref >60.00)
Glucose, Bld: 93 mg/dL (ref 70–99)
Phosphorus: 3.1 mg/dL (ref 2.3–4.6)
Potassium: 4.2 mEq/L (ref 3.5–5.1)
Sodium: 139 mEq/L (ref 135–145)

## 2011-02-13 LAB — POCT URINALYSIS DIPSTICK
Bilirubin, UA: NEGATIVE
Blood, UA: NEGATIVE
Glucose, UA: NEGATIVE
Ketones, UA: NEGATIVE
Nitrite, UA: NEGATIVE
Protein, UA: NEGATIVE
Spec Grav, UA: >=1.03
Urobilinogen, UA: 0.2
pH, UA: 5

## 2011-02-13 LAB — CBC
HCT: 28.6 % — ABNORMAL LOW (ref 36.0–46.0)
Hemoglobin: 9.2 g/dL — ABNORMAL LOW (ref 12.0–15.0)
MCH: 27.3 pg (ref 26.0–34.0)
MCHC: 32.2 g/dL (ref 30.0–36.0)
MCV: 84.9 fL (ref 78.0–100.0)
Platelets: 364 10*3/uL (ref 150–400)
RBC: 3.37 MIL/uL — ABNORMAL LOW (ref 3.87–5.11)
RDW: 13.2 % (ref 11.5–15.5)
WBC: 5.4 10*3/uL (ref 4.0–10.5)

## 2011-02-13 MED ORDER — FUROSEMIDE 20 MG PO TABS: 20.0000 mg | ORAL_TABLET | Freq: Every day | ORAL | Status: DC | PRN

## 2011-02-13 NOTE — Assessment & Plan Note (Signed)
She reports daily but hard small stool. Add Benefiber twice a day  And Align back, increase fluids. If no relief add Colace daily

## 2011-02-13 NOTE — Assessment & Plan Note (Signed)
Multifactorial, related to Thyroid disease, anemia, abdominal pain but also some difficulty with sleep. She may benefit from a sleep study in the future if symptoms do not improve

## 2011-02-13 NOTE — Assessment & Plan Note (Signed)
Repeat renal panel today 

## 2011-02-13 NOTE — Assessment & Plan Note (Signed)
Repeat UA today

## 2011-02-13 NOTE — Patient Instructions (Addendum)
Anemia and Aging   Hemoglobin is the protein in your blood that carries oxygen. If this protein becomes too low, you are anemic. Usually mild anemia (low red blood cells) may not be noticeable. However, if it becomes more severe, you may feel tired, short of breath with activity, and may experience fainting or palpitations (heart beats you can feel). As many as 61% of elderly people have anemia. This depends on age, sex, and overall health. The chance of becoming anemic increases after age 65 and is greater in men.  CAUSES  There are many causes of anemia. Studies have shown the 2 major causes of anemia in the elderly are:    Chronic (long standing) disease.    Anemia of chronic disease: Certain chronic diseases can interfere with the production of red blood cells, resulting in chronic anemia. The kidneys produce a hormone called erythropoietin which stimulates your bone marrow to produce red blood cells. Bone marrow is the soft, spongy tissue found in bone cavities which makes and stores most of the red blood cells.    Iron is also needed for red blood cell production. In anemia of chronic disease a body can not use its stored iron. Erythropoietin is suppressed (held down from working) and the bone marrow does not respond normally. A shortage of iron and erythropoietin can result in a shortage of red blood cells.    Iron deficiency.    Iron deficiency anemia can result from chronic, often undetected blood loss. Another cause is from not eating enough iron in your diet, or when your body is not absorbing enough iron. About 20% to 36% of elderly people have idiopathic anemia. This means no cause for the anemia can be found.   EFFECTS OF UNTREATED ANEMIA  Elderly people with anemia are 40% more likely to have problems that keep them from being independent. Some of these problems include:   Poor balance.    Falling more often.    Heart trouble.    Depression.    Problems with memory and concentration.    Not  being able to walk long distances.   Anemia has been shown to shorten the life expectancy of elderly people. As early as age 65, people with kidney disease, diabetes, and/or heart failure are more likely to die if they have anemia. Anemia can also make certain medical conditions worse. Getting treatment for anemia is important. This does not guarantee you will live longer if the anemia is corrected, but it this information shows how important it is to treat your anemia.   SYMPTOMS  Most people do not realize they are anemic until a blood test is done. Symptoms and signs usually develop when anemia is moderate to severe. Symptoms can include:   Fatigue.   Weakness.    Pale skin.    Chest pain.    Dizziness.   Irritability.   Trouble breathing.    A fast heartbeat.    Headaches.    Numbness or coldness in your hands and feet.    One of the problems in identifying anemia in the elderly is that many of these symptoms are typically associated with getting older. It is important to see your caregiver on a regular basis in order to be tested for anemia.   TREATMENT  Your caregiver will provide the treatment that is best for you based on what is causing the anemia. Several medications are approved to help correct anemia. Close communication with your caregiver will help you   is greater than 14 g/dL for men, with a normal hematocrit which is greater than 42%. Document Released: 12/19/2003 Document Re-Released: 10/30/2009 [facility] Patient Information 2011 [place], [place].  Try aspercreme to abdominal wall daily Add back a fiber supplement such as Benefiber twice daily in a beverage Take a probiotic every day If bowels do not soften up consider Docusate/Colace 1 cap daily

## 2011-02-13 NOTE — Assessment & Plan Note (Signed)
Follows with Dr Katrinka Blazing, no changes recently

## 2011-02-13 NOTE — Assessment & Plan Note (Signed)
Following with endocrinology presently, maintained on Tapazole

## 2011-02-13 NOTE — Assessment & Plan Note (Signed)
Repeat CBC today and continue iron supplement for now.

## 2011-02-13 NOTE — Progress Notes (Signed)
Informed [name]

## 2011-02-13 NOTE — Progress Notes (Signed)
Addended by: Baldemar Lenis R on: 02/13/2011 12:10 PM   Modules accepted: Orders

## 2011-02-13 NOTE — Assessment & Plan Note (Signed)
RLQ is persistent but stable, tender to touch. She has seen GI recently and seems reassured that the pain is more related to adhesions than anything else. We will recheck a UA and CBC today and she is encouraged to gently massage the abdominal wall with Aspercreme daily for some relief and we will reassess next month. Report significant worsening of pain

## 2011-02-13 NOTE — Progress Notes (Signed)
[name] [id number] [birth year] 02/13/2011      Progress Note-Follow Up  Subjective  Chief Complaint  Chief Complaint  Patient presents with  . Follow-up    1 month follow up    HPI  Patient is a 60 yo Caucasian female in today for follow up on multiple medical concerns. She continues to have rlq pain, it iw worse with palpation. She has seen GI recently and after doing exam they have advised her if likely due to adhesions. She's had no other great changes. No fevers, chills or change in abdominal pain. Her bowels continue to move his toes but it is hard and small. She does have to strain but she has not seen any blood or tarry stool. She continues to struggle with fatigue. Has trouble falling asleep and staying asleep but Seroquel helps her get a decent night's sleep compared to the time before she used it. No chest pain or palpitations, shortness of breath. She believes her psychiatric meds are keeping her depression at bay. Her family is concerned about some recent memory difficulties. No headache, congestion or other new complaints are noted at today's visit.  Past Medical History  Diagnosis Date  . Anemia   . Anxiety   . Depression   . Diverticulosis   . Elevated cholesterol   . Obesity   . THYROMEGALY 07/22/2010  . Overweight 03/11/2010  . Mixed hyperlipidemia 03/11/2010  . MEASLES, HX OF 03/11/2010  . KNEE PAIN, RIGHT 05/28/2010  . HYPERTRIGLYCERIDEMIA 03/11/2010  . HEART MURMUR, HX OF 03/11/2010  . FIBROIDS, UTERUS 03/11/2010  . Diarrhea 08/05/2010  . CONSTIPATION 03/11/2010  . CHICKENPOX, HX OF 03/11/2010  . BIPOLAR AFFECTIVE DISORDER 03/11/2010  . AORTIC VALVE REPLACEMENT, HX OF 03/11/2010  . ANEMIA 05/28/2010  . Acute bronchitis 07/22/2010  . Abdominal pain, generalized 03/19/2010  . Peripheral edema 11/18/2010  . Bacterial vaginitis 11/18/2010  . Hematuria 12/18/2010  . Diverticulitis 12/18/2010  . Hyperthyroidism 12/18/2010  . Fatigue 12/30/2010  . Renal insufficiency  12/30/2010  . Grave's disease 8-12    Past Surgical History  Procedure Date  . Appendectomy     Required revision for EColi infection, required recurrent packing  . Bowel obstruction     Requiring adhesions to be removed  . Abdominal hysterectomy     sb/l spo, total  . Aortic valve replacement   . Open heart with a cardiac aneurysm repair   . Cholecystectomy   . Tubal ligation   . Varicose vein surgery b/l legs   . Childhood exploratory surgery of heart   . Hernia repair 08-16-10    Family History  Problem Relation Age of Onset  . Scoliosis Mother   . Arthritis Mother     Rheumatoid  . Aneurysm Mother     heart  . Alcohol abuse Father   . Depression Sister   . Depression Brother   . Alcohol abuse Brother   . Cancer Maternal Grandmother     colon  . Diabetes Maternal Grandfather   . Heart disease Maternal Grandfather   . Alcohol abuse Maternal Grandfather   . Depression Paternal Grandmother   . Diabetes Paternal Grandmother   . Depression Paternal Grandfather   . Diabetes Paternal Grandfather   . Depression Sister   . Alcohol abuse Brother   . Depression Brother   . Heart disease Brother     History   Social History  . Marital Status: Married    Spouse Name: N/A    Number  of Children: N/A  . Years of Education: N/A   Occupational History  . Not on file.   Social History Main Topics  . Smoking status: Former Smoker    Quit date: 05/26/1990  . Smokeless tobacco: Never Used  . Alcohol Use: 0.0 oz/week    0 drink(s) per week     very serious occasions  . Drug Use: No  . Sexually Active: No   Other Topics Concern  . Not on file   Social History Narrative  . No narrative on file    Current Outpatient Prescriptions on File Prior to Visit  Medication Sig Dispense Refill  . acetaminophen (TYLENOL) 325 MG tablet Take 650 mg by mouth every 6 (six) hours as needed.        . Acetaminophen-Codeine (TYLENOL/CODEINE #3) 300-30 MG per tablet Take 1 tablet by  mouth every 6 (six) hours as needed. Cough/ pain  40 tablet  1  . aspirin 81 MG tablet Take 81 mg by mouth daily.        . clonazePAM (KLONOPIN) 0.5 MG tablet Take 0.5 mg by mouth 2 (two) times daily. 1/2 tab       . DULoxetine (CYMBALTA) 30 MG capsule Take 30 mg by mouth daily.        . fenofibrate 160 MG tablet Take 160 mg by mouth daily.        . Ferrous Fumarate-Folic Acid (HEMOCYTE-F) 324-1 MG TABS Take 1 tablet by mouth daily.  30 each  3  . lamoTRIgine (LAMICTAL) 150 MG tablet Take 150 mg by mouth daily. 1/2 tab       . methimazole (TAPAZOLE) 10 MG tablet 4 tabs, 2x a day  240 tablet  2  . pravastatin (PRAVACHOL) 40 MG tablet Take 40 mg by mouth at bedtime.        . Probiotic Product (ALIGN) 4 MG CAPS Take 1 capsule by mouth daily.  30 capsule  0  . QUEtiapine (SEROQUEL) 200 MG tablet Take 200 mg by mouth at bedtime. 1-2 tab       . SINGULAIR 10 MG tablet TAKE 1 TABLET EVERY DAY  30 tablet  1  . warfarin (COUMADIN) 5 MG tablet Take 5 mg by mouth daily. 1/2 to 1 tab         Allergies  Allergen Reactions  . Sulfonamide Derivatives     Review of Systems  Review of Systems  Constitutional: Positive for malaise/fatigue. Negative for fever.  HENT: Negative for congestion.   Eyes: Negative for discharge.  Respiratory: Negative for shortness of breath.   Cardiovascular: Negative for chest pain, palpitations and leg swelling.  Gastrointestinal: Positive for abdominal pain and constipation. Negative for nausea, diarrhea, blood in stool and melena.  Genitourinary: Negative for dysuria.  Musculoskeletal: Negative for falls.  Skin: Negative for rash.  Neurological: Negative for loss of consciousness and headaches.  Endo/Heme/Allergies: Negative for polydipsia.  Psychiatric/Behavioral: Positive for depression and memory loss. Negative for suicidal ideas. The patient is not nervous/anxious and does not have insomnia.        Patient's family was concerned because she seems to forget a lot  of things. MMSE today is 28/30 but she refused to do the counting backwards section    Objective  BP 109/66  Pulse 85  Temp(Src) 98.2 F (36.8 C) (Oral)  Ht 5' 7.25" (1.708 m)  Wt 213 lb 12.8 oz (96.979 kg)  BMI 33.24 kg/m2  SpO2 94%  Physical Exam  Physical Exam  Constitutional: She is oriented to person, place, and time and well-developed, well-nourished, and in no distress. No distress.  HENT:  Head: Normocephalic and atraumatic.  Eyes: Conjunctivae are normal.  Neck: Neck supple. No thyromegaly present.  Cardiovascular: Normal rate and regular rhythm.   No murmur heard.      Click noted  Pulmonary/Chest: Effort normal and breath sounds normal. She has no wheezes.  Abdominal: She exhibits no distension and no mass.  Musculoskeletal: She exhibits no edema.  Lymphadenopathy:    She has no cervical adenopathy.  Neurological: She is alert and oriented to person, place, and time.  Skin: Skin is warm and dry. No rash noted. She is not diaphoretic.  Psychiatric: Memory, affect and judgment normal.    Lab Results  Component Value Date   TSH 0.01* 01/24/2011   Lab Results  Component Value Date   WBC 3.5* 01/14/2011   HGB 9.9* 01/14/2011   HCT 29.6* 01/14/2011   MCV 86.4 01/14/2011   PLT 193.0 01/14/2011   Lab Results  Component Value Date   CREATININE 1.1 12/27/2010   BUN 26* 12/27/2010   NA 141 12/27/2010   K 4.5 12/27/2010   CL 108 12/27/2010   CO2 27 12/27/2010   Lab Results  Component Value Date   ALT 24 12/16/2010   AST 27 12/16/2010   ALKPHOS 23* 12/16/2010   BILITOT 0.6 12/16/2010   Lab Results  Component Value Date   CHOL 169 12/16/2010   Lab Results  Component Value Date   HDL 43.40 12/16/2010   No results found for this basename: [facility]   Lab Results  Component Value Date   TRIG 244.0* 12/16/2010   Lab Results  Component Value Date   CHOLHDL 4 12/16/2010     Assessment & Plan  ABDOMINAL PAIN, GENERALIZED RLQ is persistent but stable, tender to touch.  She has seen GI recently and seems reassured that the pain is more related to adhesions than anything else. We will recheck a UA and CBC today and she is encouraged to gently massage the abdominal wall with Aspercreme daily for some relief and we will reassess next month. Report significant worsening of pain  ANEMIA Repeat CBC today and continue iron supplement for now.  Hematuria Repeat UA today  Hyperthyroidism Following with endocrinology presently, maintained on Tapazole  Renal insufficiency Repeat renal panel today.  Peripheral edema Improved with compression hose use, encouraged to continue this and to use Furosemide sparingly. Uses it infrequently at this time  HEART MURMUR, HX OF Follows with Dr Katrinka Blazing, no changes recently  Fatigue Multifactorial, related to Thyroid disease, anemia, abdominal pain but also some difficulty with sleep. She may benefit from a sleep study in the future if symptoms do not improve  CONSTIPATION She reports daily but hard small stool. Add Benefiber twice a day  And Align back, increase fluids. If no relief add Colace daily

## 2011-02-13 NOTE — Progress Notes (Signed)
Addended by: Court Joy on: 02/13/2011 11:22 AM   Modules accepted: Orders

## 2011-02-13 NOTE — Assessment & Plan Note (Signed)
Improved with compression hose use, encouraged to continue this and to use Furosemide sparingly. Uses it infrequently at this time

## 2011-02-15 LAB — URINE CULTURE

## 2011-02-17 ENCOUNTER — Telehealth: Payer: Self-pay | Admitting: *Deleted

## 2011-02-17 DIAGNOSIS — D649 Anemia, unspecified: Secondary | ICD-10-CM

## 2011-02-17 MED ORDER — NITROFURANTOIN MONOHYD MACRO 100 MG PO CAPS: 100.0000 mg | ORAL_CAPSULE | Freq: Two times a day (BID) | ORAL | Status: AC

## 2011-02-17 NOTE — Telephone Encounter (Signed)
RX sent to pharm per result note.

## 2011-02-20 ENCOUNTER — Other Ambulatory Visit (INDEPENDENT_AMBULATORY_CARE_PROVIDER_SITE_OTHER): Payer: PRIVATE HEALTH INSURANCE

## 2011-02-20 DIAGNOSIS — D649 Anemia, unspecified: Secondary | ICD-10-CM

## 2011-02-20 DIAGNOSIS — N390 Urinary tract infection, site not specified: Secondary | ICD-10-CM

## 2011-02-20 LAB — POCT URINALYSIS DIPSTICK
Blood, UA: NEGATIVE
Glucose, UA: NEGATIVE
Nitrite, UA: NEGATIVE
Spec Grav, UA: >=1.03
Urobilinogen, UA: 0.2
pH, UA: 5.5

## 2011-02-20 LAB — CBC
HCT: 27.6 % — ABNORMAL LOW (ref 36.0–46.0)
Hemoglobin: 9.2 g/dL — ABNORMAL LOW (ref 12.0–15.0)
MCH: 28 pg (ref 26.0–34.0)
MCHC: 33.3 g/dL (ref 30.0–36.0)
MCV: 83.9 fL (ref 78.0–100.0)
Platelets: 334 10*3/uL (ref 150–400)
RBC: 3.29 MIL/uL — ABNORMAL LOW (ref 3.87–5.11)
RDW: 13.8 % (ref 11.5–15.5)
WBC: 4.4 10*3/uL (ref 4.0–10.5)

## 2011-02-20 LAB — VITAMIN B12: Vitamin B-12: 363 pg/mL (ref 211–911)

## 2011-02-20 LAB — FOLATE: Folate: 16.4 ng/mL

## 2011-02-20 NOTE — Telephone Encounter (Signed)
Patient continues to struggle with anemia and fatigue will ask for Hematologic evaluation to further work up

## 2011-02-20 NOTE — Telephone Encounter (Signed)
Addended by: Danise Edge A on: 02/20/2011 03:28 PM   Modules accepted: Orders

## 2011-02-21 ENCOUNTER — Ambulatory Visit (INDEPENDENT_AMBULATORY_CARE_PROVIDER_SITE_OTHER): Payer: PRIVATE HEALTH INSURANCE

## 2011-02-21 DIAGNOSIS — D510 Vitamin B12 deficiency anemia due to intrinsic factor deficiency: Secondary | ICD-10-CM

## 2011-02-21 LAB — RETICULOCYTES
ABS Retic: 77.1 10*3/uL (ref 19.0–186.0)
RBC.: 3.35 MIL/uL — ABNORMAL LOW (ref 3.87–5.11)
Retic Ct Pct: 2.3 % (ref 0.4–2.3)

## 2011-02-21 LAB — HOMOCYSTEINE: Homocysteine: 21.4 umol/L — ABNORMAL HIGH (ref 4.0–15.4)

## 2011-02-21 MED ORDER — CYANOCOBALAMIN 1000 MCG/ML IJ SOLN
1000.0000 ug | Freq: Once | INTRAMUSCULAR | Status: AC
  Administered 2011-02-21: 1000 ug via INTRAMUSCULAR

## 2011-02-22 LAB — URINE CULTURE
Colony Count: NO GROWTH
Organism ID, Bacteria: NO GROWTH

## 2011-02-24 ENCOUNTER — Other Ambulatory Visit: Payer: Self-pay | Admitting: Family Medicine

## 2011-02-24 ENCOUNTER — Encounter: Payer: Self-pay | Admitting: Endocrinology

## 2011-02-24 ENCOUNTER — Ambulatory Visit (INDEPENDENT_AMBULATORY_CARE_PROVIDER_SITE_OTHER): Payer: PRIVATE HEALTH INSURANCE | Admitting: Endocrinology

## 2011-02-24 ENCOUNTER — Other Ambulatory Visit (INDEPENDENT_AMBULATORY_CARE_PROVIDER_SITE_OTHER): Payer: PRIVATE HEALTH INSURANCE

## 2011-02-24 VITALS — BP 114/70 | HR 78 | Temp 98.2°F | Ht 67.5 in | Wt 214.6 lb

## 2011-02-24 DIAGNOSIS — E0590 Thyrotoxicosis, unspecified without thyrotoxic crisis or storm: Secondary | ICD-10-CM

## 2011-02-24 LAB — T4, FREE: Free T4: 1.74 ng/dL — ABNORMAL HIGH (ref 0.60–1.60)

## 2011-02-24 LAB — TSH: TSH: 0.01 u[IU]/mL — ABNORMAL LOW (ref 0.35–5.50)

## 2011-02-24 NOTE — Progress Notes (Signed)
Subjective:    Patient ID: [name], female    DOB: 1951/04/12, 60 y.o.   MRN: [id number]  HPI Pt returns for f/u of hyperthyroidism, in the setting of recent amiodarone rx.  She says she still has diffuse tremulousness, but she is starting to feel better in general.   Past Medical History  Diagnosis Date  . Anemia   . Anxiety   . Depression   . Diverticulosis   . Elevated cholesterol   . Obesity   . THYROMEGALY 07/22/2010  . Overweight 03/11/2010  . Mixed hyperlipidemia 03/11/2010  . MEASLES, HX OF 03/11/2010  . KNEE PAIN, RIGHT 05/28/2010  . HYPERTRIGLYCERIDEMIA 03/11/2010  . HEART MURMUR, HX OF 03/11/2010  . FIBROIDS, UTERUS 03/11/2010  . Diarrhea 08/05/2010  . CONSTIPATION 03/11/2010  . CHICKENPOX, HX OF 03/11/2010  . BIPOLAR AFFECTIVE DISORDER 03/11/2010  . AORTIC VALVE REPLACEMENT, HX OF 03/11/2010  . ANEMIA 05/28/2010  . Acute bronchitis 07/22/2010  . Abdominal pain, generalized 03/19/2010  . Peripheral edema 11/18/2010  . Bacterial vaginitis 11/18/2010  . Hematuria 12/18/2010  . Diverticulitis 12/18/2010  . Hyperthyroidism 12/18/2010  . Fatigue 12/30/2010  . Renal insufficiency 12/30/2010  . Grave's disease 8-12    Past Surgical History  Procedure Date  . Appendectomy     Required revision for EColi infection, required recurrent packing  . Bowel obstruction     Requiring adhesions to be removed  . Abdominal hysterectomy     sb/l spo, total  . Aortic valve replacement   . Open heart with a cardiac aneurysm repair   . Cholecystectomy   . Tubal ligation   . Varicose vein surgery b/l legs   . Childhood exploratory surgery of heart   . Hernia repair 08-16-10    History   Social History  . Marital Status: Married    Spouse Name: N/A    Number of Children: N/A  . Years of Education: N/A   Occupational History  . Not on file.   Social History Main Topics  . Smoking status: Former Smoker    Quit date: 05/26/1990  . Smokeless tobacco: Never Used  . Alcohol  Use: 0.0 oz/week    0 drink(s) per week     very serious occasions  . Drug Use: No  . Sexually Active: No   Other Topics Concern  . Not on file   Social History Narrative  . No narrative on file    Current Outpatient Prescriptions on File Prior to Visit  Medication Sig Dispense Refill  . acetaminophen (TYLENOL) 325 MG tablet Take 650 mg by mouth every 6 (six) hours as needed.        . Acetaminophen-Codeine (TYLENOL/CODEINE #3) 300-30 MG per tablet Take 1 tablet by mouth every 6 (six) hours as needed. Cough/ pain  40 tablet  1  . aspirin 81 MG tablet Take 81 mg by mouth daily.        . clonazePAM (KLONOPIN) 0.5 MG tablet Take 0.5 mg by mouth 2 (two) times daily. 1/2 tab       . DULoxetine (CYMBALTA) 30 MG capsule Take 30 mg by mouth daily.        . fenofibrate 160 MG tablet Take 160 mg by mouth daily.        . Ferrous Fumarate-Folic Acid (HEMOCYTE-F) 324-1 MG TABS Take 1 tablet by mouth daily.  30 each  3  . furosemide (LASIX) 20 MG tablet Take 1 tablet (20 mg total) by mouth daily as needed.  allergies  30 tablet  2  . lamoTRIgine (LAMICTAL) 150 MG tablet Take 150 mg by mouth daily. 1/2 tab       . methimazole (TAPAZOLE) 10 MG tablet 4 tabs, 2x a day  240 tablet  2  . pravastatin (PRAVACHOL) 40 MG tablet Take 40 mg by mouth at bedtime.        . Probiotic Product (ALIGN) 4 MG CAPS Take 1 capsule by mouth daily.  30 capsule  0  . QUEtiapine (SEROQUEL) 200 MG tablet Take 200 mg by mouth at bedtime. 1-2 tab       . SINGULAIR 10 MG tablet TAKE 1 TABLET EVERY DAY  30 tablet  1  . warfarin (COUMADIN) 5 MG tablet Take 5 mg by mouth daily. 1/2 to 1 tab         Allergies  Allergen Reactions  . Sulfonamide Derivatives     Family History  Problem Relation Age of Onset  . Scoliosis Mother   . Arthritis Mother     Rheumatoid  . Aneurysm Mother     heart  . Alcohol abuse Father   . Depression Sister   . Depression Brother   . Alcohol abuse Brother   . Cancer Maternal Grandmother      colon  . Diabetes Maternal Grandfather   . Heart disease Maternal Grandfather   . Alcohol abuse Maternal Grandfather   . Depression Paternal Grandmother   . Diabetes Paternal Grandmother   . Depression Paternal Grandfather   . Diabetes Paternal Grandfather   . Depression Sister   . Alcohol abuse Brother   . Depression Brother   . Heart disease Brother     BP 114/70  Pulse 78  Temp(Src) 98.2 F (36.8 C) (Oral)  Ht 5' 7.5" (1.715 m)  Wt 214 lb 9.6 oz (97.342 kg)  BMI 33.12 kg/m2  SpO2 97%  Review of Systems Denies fever.      Objective:   Physical Exam VITAL SIGNS:  See vs page GENERAL: no distress. Thyroid: slightly and diffusely enlarged. No nodule Neurol: slight tremor of the hand.    Lab Results  Component Value Date   WBC 4.4 02/20/2011   HGB 9.2* 02/20/2011   HCT 27.6* 02/20/2011   MCV 83.9 02/20/2011   PLT 334 02/20/2011      Assessment & Plan:  Hyperthyroidism in the setting of recent amiodarone rx, clinically improved. H/o abnl cbc, very unlikely related to tapazole.

## 2011-02-24 NOTE — Patient Instructions (Addendum)
blood tests are being requested for you today.  please call [phone] to hear your test results.  You will be prompted to enter the 9-digit "MRN" number that appears at the top left of this page, followed by #.  Then you will hear the message.  Please make a follow-up appointment in 1 month. if ever you have fever while taking methimazole, stop it and call us, because of the risk of a rare side-effect.

## 2011-02-24 NOTE — Telephone Encounter (Signed)
Please advise refill? 

## 2011-02-25 LAB — POCT I-STAT 3, ART BLOOD GAS (G3+)
Acid-base deficit: 1 mmol/L (ref 0.0–2.0)
Bicarbonate: 24.8 meq/L — ABNORMAL HIGH (ref 20.0–24.0)
Bicarbonate: 25.2 mEq/L — ABNORMAL HIGH (ref 20.0–24.0)
O2 Saturation: 91 %
O2 Saturation: 95 %
TCO2: 26 mmol/L (ref 0–100)
TCO2: 27 mmol/L (ref 0–100)
pCO2 arterial: 42.3 mmHg (ref 35.0–45.0)
pCO2 arterial: 43 mmHg (ref 35.0–45.0)
pH, Arterial: 7.375 (ref 7.350–7.400)
pH, Arterial: 7.377 (ref 7.350–7.400)
pO2, Arterial: 64 mmHg — ABNORMAL LOW (ref 80.0–100.0)
pO2, Arterial: 76 mmHg — ABNORMAL LOW (ref 80.0–100.0)

## 2011-02-25 LAB — POCT I-STAT 3, VENOUS BLOOD GAS (G3P V)
Acid-base deficit: 1 mmol/L (ref 0.0–2.0)
Bicarbonate: 24.8 mEq/L — ABNORMAL HIGH (ref 20.0–24.0)
O2 Saturation: 70 %
TCO2: 26 mmol/L (ref 0–100)
pCO2, Ven: 45.2 mmHg (ref 45.0–50.0)
pH, Ven: 7.346 — ABNORMAL HIGH (ref 7.250–7.300)
pO2, Ven: 39 mmHg (ref 30.0–45.0)

## 2011-02-27 LAB — INTRINSIC FACTOR ANTIBODIES: Intrinsic Factor: NEGATIVE

## 2011-02-28 LAB — URINALYSIS, ROUTINE W REFLEX MICROSCOPIC
Bilirubin Urine: NEGATIVE
Glucose, UA: NEGATIVE mg/dL
Hgb urine dipstick: NEGATIVE
Ketones, ur: NEGATIVE mg/dL
Nitrite: NEGATIVE
Protein, ur: NEGATIVE mg/dL
Specific Gravity, Urine: 1.022 (ref 1.005–1.030)
Urobilinogen, UA: 1 mg/dL (ref 0.0–1.0)
pH: 6.5 (ref 5.0–8.0)

## 2011-02-28 LAB — TYPE AND SCREEN
ABO/RH(D): A POS
Antibody Screen: NEGATIVE

## 2011-02-28 LAB — PREPARE CRYOPRECIPITATE

## 2011-02-28 LAB — PROTIME-INR
INR: 1 (ref 0.00–1.49)
INR: 1.2 (ref 0.00–1.49)
INR: 1.3 (ref 0.00–1.49)
INR: 1.4 (ref 0.00–1.49)
Prothrombin Time: 12.9 seconds (ref 11.6–15.2)
Prothrombin Time: 15.3 seconds — ABNORMAL HIGH (ref 11.6–15.2)
Prothrombin Time: 17 seconds — ABNORMAL HIGH (ref 11.6–15.2)
Prothrombin Time: 17.5 seconds — ABNORMAL HIGH (ref 11.6–15.2)

## 2011-02-28 LAB — CREATININE, SERUM
Creatinine, Ser: 0.72 mg/dL (ref 0.4–1.2)
Creatinine, Ser: 0.89 mg/dL (ref 0.4–1.2)
GFR calc Af Amer: >60 mL/min (ref >60)
GFR calc Af Amer: >60 mL/min (ref >60)
GFR calc non Af Amer: >60 mL/min (ref >60)
GFR calc non Af Amer: >60 mL/min (ref >60)

## 2011-02-28 LAB — POCT I-STAT 3, ART BLOOD GAS (G3+)
Acid-Base Excess: 3 mmol/L — ABNORMAL HIGH (ref 0.0–2.0)
Acid-base deficit: 1 mmol/L (ref 0.0–2.0)
Acid-base deficit: 4 mmol/L — ABNORMAL HIGH (ref 0.0–2.0)
Acid-base deficit: 5 mmol/L — ABNORMAL HIGH (ref 0.0–2.0)
Bicarbonate: 20.3 mEq/L (ref 20.0–24.0)
Bicarbonate: 21.3 mEq/L (ref 20.0–24.0)
Bicarbonate: 25.4 mEq/L — ABNORMAL HIGH (ref 20.0–24.0)
Bicarbonate: 27.1 mEq/L — ABNORMAL HIGH (ref 20.0–24.0)
O2 Saturation: 100 %
O2 Saturation: 100 %
O2 Saturation: 98 %
O2 Saturation: 99 %
Patient temperature: 35
Patient temperature: 37.4
TCO2: 21 mmol/L (ref 0–100)
TCO2: 22 mmol/L (ref 0–100)
TCO2: 27 mmol/L (ref 0–100)
TCO2: 28 mmol/L (ref 0–100)
pCO2 arterial: 36.2 mmHg (ref 35.0–45.0)
pCO2 arterial: 37 mmHg (ref 35.0–45.0)
pCO2 arterial: 38.3 mmHg (ref 35.0–45.0)
pCO2 arterial: 49.4 mmHg — ABNORMAL HIGH (ref 35.0–45.0)
pH, Arterial: 7.319 — ABNORMAL LOW (ref 7.350–7.400)
pH, Arterial: 7.348 — ABNORMAL LOW (ref 7.350–7.400)
pH, Arterial: 7.368 (ref 7.350–7.400)
pH, Arterial: 7.458 — ABNORMAL HIGH (ref 7.350–7.400)
pO2, Arterial: 133 mmHg — ABNORMAL HIGH (ref 80.0–100.0)
pO2, Arterial: 238 mmHg — ABNORMAL HIGH (ref 80.0–100.0)
pO2, Arterial: 375 mmHg — ABNORMAL HIGH (ref 80.0–100.0)
pO2, Arterial: 96 mmHg (ref 80.0–100.0)

## 2011-02-28 LAB — POCT I-STAT 4, (NA,K, GLUC, HGB,HCT)
Glucose, Bld: 106 mg/dL — ABNORMAL HIGH (ref 70–99)
Glucose, Bld: 110 mg/dL — ABNORMAL HIGH (ref 70–99)
Glucose, Bld: 110 mg/dL — ABNORMAL HIGH (ref 70–99)
Glucose, Bld: 124 mg/dL — ABNORMAL HIGH (ref 70–99)
Glucose, Bld: 140 mg/dL — ABNORMAL HIGH (ref 70–99)
Glucose, Bld: 159 mg/dL — ABNORMAL HIGH (ref 70–99)
Glucose, Bld: 183 mg/dL — ABNORMAL HIGH (ref 70–99)
HCT: 20 % — ABNORMAL LOW (ref 36.0–46.0)
HCT: 20 % — ABNORMAL LOW (ref 36.0–46.0)
HCT: 21 % — ABNORMAL LOW (ref 36.0–46.0)
HCT: 24 % — ABNORMAL LOW (ref 36.0–46.0)
HCT: 27 % — ABNORMAL LOW (ref 36.0–46.0)
HCT: 32 % — ABNORMAL LOW (ref 36.0–46.0)
HCT: 39 % (ref 36.0–46.0)
Hemoglobin: 10.9 g/dL — ABNORMAL LOW (ref 12.0–15.0)
Hemoglobin: 13.3 g/dL (ref 12.0–15.0)
Hemoglobin: 6.8 g/dL — CL (ref 12.0–15.0)
Hemoglobin: 6.8 g/dL — CL (ref 12.0–15.0)
Hemoglobin: 7.1 g/dL — CL (ref 12.0–15.0)
Hemoglobin: 8.2 g/dL — ABNORMAL LOW (ref 12.0–15.0)
Hemoglobin: 9.2 g/dL — ABNORMAL LOW (ref 12.0–15.0)
Potassium: 3.1 mEq/L — ABNORMAL LOW (ref 3.5–5.1)
Potassium: 3.3 mEq/L — ABNORMAL LOW (ref 3.5–5.1)
Potassium: 3.5 mEq/L (ref 3.5–5.1)
Potassium: 3.5 mEq/L (ref 3.5–5.1)
Potassium: 3.7 mEq/L (ref 3.5–5.1)
Potassium: 3.7 mEq/L (ref 3.5–5.1)
Potassium: 4.1 mEq/L (ref 3.5–5.1)
Sodium: 136 mEq/L (ref 135–145)
Sodium: 137 mEq/L (ref 135–145)
Sodium: 138 mEq/L (ref 135–145)
Sodium: 138 mEq/L (ref 135–145)
Sodium: 141 mEq/L (ref 135–145)
Sodium: 141 mEq/L (ref 135–145)
Sodium: 142 mEq/L (ref 135–145)

## 2011-02-28 LAB — BLOOD GAS, ARTERIAL
Acid-Base Excess: 1.5 mmol/L (ref 0.0–2.0)
Bicarbonate: 25.2 mEq/L — ABNORMAL HIGH (ref 20.0–24.0)
Drawn by: 277341
FIO2: 0.21 %
O2 Saturation: 97.9 %
Patient temperature: 98.6
TCO2: 26.3 mmol/L (ref 0–100)
pCO2 arterial: 36.8 mmHg (ref 35.0–45.0)
pH, Arterial: 7.449 — ABNORMAL HIGH (ref 7.350–7.400)
pO2, Arterial: 93 mmHg (ref 80.0–100.0)

## 2011-02-28 LAB — POCT I-STAT, CHEM 8
BUN: 13 mg/dL (ref 6–23)
BUN: 14 mg/dL (ref 6–23)
Calcium, Ion: 1.13 mmol/L (ref 1.12–1.32)
Calcium, Ion: 1.19 mmol/L (ref 1.12–1.32)
Chloride: 105 mEq/L (ref 96–112)
Chloride: 109 mEq/L (ref 96–112)
Creatinine, Ser: 0.7 mg/dL (ref 0.4–1.2)
Creatinine, Ser: 0.8 mg/dL (ref 0.4–1.2)
Glucose, Bld: 122 mg/dL — ABNORMAL HIGH (ref 70–99)
Glucose, Bld: 174 mg/dL — ABNORMAL HIGH (ref 70–99)
HCT: 26 % — ABNORMAL LOW (ref 36.0–46.0)
HCT: 32 % — ABNORMAL LOW (ref 36.0–46.0)
Hemoglobin: 10.9 g/dL — ABNORMAL LOW (ref 12.0–15.0)
Hemoglobin: 8.8 g/dL — ABNORMAL LOW (ref 12.0–15.0)
Potassium: 3.9 mEq/L (ref 3.5–5.1)
Potassium: 4.1 mEq/L (ref 3.5–5.1)
Sodium: 142 mEq/L (ref 135–145)
Sodium: 142 mEq/L (ref 135–145)
TCO2: 23 mmol/L (ref 0–100)
TCO2: 24 mmol/L (ref 0–100)

## 2011-02-28 LAB — CBC
HCT: 25.2 % — ABNORMAL LOW (ref 36.0–46.0)
HCT: 25.8 % — ABNORMAL LOW (ref 36.0–46.0)
HCT: 26.2 % — ABNORMAL LOW (ref 36.0–46.0)
HCT: 26.8 % — ABNORMAL LOW (ref 36.0–46.0)
HCT: 27.9 % — ABNORMAL LOW (ref 36.0–46.0)
HCT: 33 % — ABNORMAL LOW (ref 36.0–46.0)
HCT: 36.1 % (ref 36.0–46.0)
HCT: 38.5 % (ref 36.0–46.0)
Hemoglobin: 11.2 g/dL — ABNORMAL LOW (ref 12.0–15.0)
Hemoglobin: 12.1 g/dL (ref 12.0–15.0)
Hemoglobin: 12.9 g/dL (ref 12.0–15.0)
Hemoglobin: 8.7 g/dL — ABNORMAL LOW (ref 12.0–15.0)
Hemoglobin: 8.7 g/dL — ABNORMAL LOW (ref 12.0–15.0)
Hemoglobin: 8.8 g/dL — ABNORMAL LOW (ref 12.0–15.0)
Hemoglobin: 9 g/dL — ABNORMAL LOW (ref 12.0–15.0)
Hemoglobin: 9.5 g/dL — ABNORMAL LOW (ref 12.0–15.0)
MCHC: 33.6 g/dL (ref 30.0–36.0)
MCHC: 33.6 g/dL (ref 30.0–36.0)
MCHC: 33.7 g/dL (ref 30.0–36.0)
MCHC: 33.7 g/dL (ref 30.0–36.0)
MCHC: 33.7 g/dL (ref 30.0–36.0)
MCHC: 34.1 g/dL (ref 30.0–36.0)
MCHC: 34.1 g/dL (ref 30.0–36.0)
MCHC: 34.5 g/dL (ref 30.0–36.0)
MCV: 89 fL (ref 78.0–100.0)
MCV: 89.2 fL (ref 78.0–100.0)
MCV: 89.2 fL (ref 78.0–100.0)
MCV: 89.4 fL (ref 78.0–100.0)
MCV: 89.5 fL (ref 78.0–100.0)
MCV: 89.5 fL (ref 78.0–100.0)
MCV: 90.2 fL (ref 78.0–100.0)
MCV: 90.2 fL (ref 78.0–100.0)
Platelets: 135 10*3/uL — ABNORMAL LOW (ref 150–400)
Platelets: 139 10*3/uL — ABNORMAL LOW (ref 150–400)
Platelets: 145 10*3/uL — ABNORMAL LOW (ref 150–400)
Platelets: 146 10*3/uL — ABNORMAL LOW (ref 150–400)
Platelets: 163 10*3/uL (ref 150–400)
Platelets: 204 10*3/uL (ref 150–400)
Platelets: 232 10*3/uL (ref 150–400)
Platelets: 292 10*3/uL (ref 150–400)
RBC: 2.81 MIL/uL — ABNORMAL LOW (ref 3.87–5.11)
RBC: 2.87 MIL/uL — ABNORMAL LOW (ref 3.87–5.11)
RBC: 2.94 MIL/uL — ABNORMAL LOW (ref 3.87–5.11)
RBC: 2.97 MIL/uL — ABNORMAL LOW (ref 3.87–5.11)
RBC: 3.12 MIL/uL — ABNORMAL LOW (ref 3.87–5.11)
RBC: 3.7 MIL/uL — ABNORMAL LOW (ref 3.87–5.11)
RBC: 4.05 MIL/uL (ref 3.87–5.11)
RBC: 4.31 MIL/uL (ref 3.87–5.11)
RDW: 13.5 % (ref 11.5–15.5)
RDW: 13.6 % (ref 11.5–15.5)
RDW: 13.8 % (ref 11.5–15.5)
RDW: 13.8 % (ref 11.5–15.5)
RDW: 13.9 % (ref 11.5–15.5)
RDW: 14.2 % (ref 11.5–15.5)
RDW: 14.3 % (ref 11.5–15.5)
RDW: 14.5 % (ref 11.5–15.5)
WBC: 11.4 10*3/uL — ABNORMAL HIGH (ref 4.0–10.5)
WBC: 11.8 10*3/uL — ABNORMAL HIGH (ref 4.0–10.5)
WBC: 14 10*3/uL — ABNORMAL HIGH (ref 4.0–10.5)
WBC: 14 10*3/uL — ABNORMAL HIGH (ref 4.0–10.5)
WBC: 15.4 10*3/uL — ABNORMAL HIGH (ref 4.0–10.5)
WBC: 16.4 10*3/uL — ABNORMAL HIGH (ref 4.0–10.5)
WBC: 17 10*3/uL — ABNORMAL HIGH (ref 4.0–10.5)
WBC: 7.3 10*3/uL (ref 4.0–10.5)

## 2011-02-28 LAB — BASIC METABOLIC PANEL
BUN: 10 mg/dL (ref 6–23)
BUN: 10 mg/dL (ref 6–23)
BUN: 13 mg/dL (ref 6–23)
CO2: 23 mEq/L (ref 19–32)
CO2: 28 mEq/L (ref 19–32)
CO2: 29 mEq/L (ref 19–32)
Calcium: 8 mg/dL — ABNORMAL LOW (ref 8.4–10.5)
Calcium: 8.6 mg/dL (ref 8.4–10.5)
Calcium: 8.7 mg/dL (ref 8.4–10.5)
Chloride: 102 mEq/L (ref 96–112)
Chloride: 107 mEq/L (ref 96–112)
Chloride: 109 mEq/L (ref 96–112)
Creatinine, Ser: 0.73 mg/dL (ref 0.4–1.2)
Creatinine, Ser: 0.74 mg/dL (ref 0.4–1.2)
Creatinine, Ser: 0.77 mg/dL (ref 0.4–1.2)
GFR calc Af Amer: >60 mL/min (ref >60)
GFR calc Af Amer: >60 mL/min (ref >60)
GFR calc Af Amer: >60 mL/min (ref >60)
GFR calc non Af Amer: >60 mL/min (ref >60)
GFR calc non Af Amer: >60 mL/min (ref >60)
GFR calc non Af Amer: >60 mL/min (ref >60)
Glucose, Bld: 100 mg/dL — ABNORMAL HIGH (ref 70–99)
Glucose, Bld: 125 mg/dL — ABNORMAL HIGH (ref 70–99)
Glucose, Bld: 175 mg/dL — ABNORMAL HIGH (ref 70–99)
Potassium: 3.6 mEq/L (ref 3.5–5.1)
Potassium: 3.9 mEq/L (ref 3.5–5.1)
Potassium: 5 mEq/L (ref 3.5–5.1)
Sodium: 139 mEq/L (ref 135–145)
Sodium: 140 mEq/L (ref 135–145)
Sodium: 140 mEq/L (ref 135–145)

## 2011-02-28 LAB — GLUCOSE, CAPILLARY
Glucose-Capillary: 110 mg/dL — ABNORMAL HIGH (ref 70–99)
Glucose-Capillary: 111 mg/dL — ABNORMAL HIGH (ref 70–99)
Glucose-Capillary: 111 mg/dL — ABNORMAL HIGH (ref 70–99)
Glucose-Capillary: 112 mg/dL — ABNORMAL HIGH (ref 70–99)
Glucose-Capillary: 114 mg/dL — ABNORMAL HIGH (ref 70–99)
Glucose-Capillary: 114 mg/dL — ABNORMAL HIGH (ref 70–99)
Glucose-Capillary: 120 mg/dL — ABNORMAL HIGH (ref 70–99)
Glucose-Capillary: 121 mg/dL — ABNORMAL HIGH (ref 70–99)
Glucose-Capillary: 125 mg/dL — ABNORMAL HIGH (ref 70–99)
Glucose-Capillary: 130 mg/dL — ABNORMAL HIGH (ref 70–99)
Glucose-Capillary: 134 mg/dL — ABNORMAL HIGH (ref 70–99)
Glucose-Capillary: 138 mg/dL — ABNORMAL HIGH (ref 70–99)
Glucose-Capillary: 141 mg/dL — ABNORMAL HIGH (ref 70–99)
Glucose-Capillary: 152 mg/dL — ABNORMAL HIGH (ref 70–99)
Glucose-Capillary: 164 mg/dL — ABNORMAL HIGH (ref 70–99)
Glucose-Capillary: 169 mg/dL — ABNORMAL HIGH (ref 70–99)
Glucose-Capillary: 172 mg/dL — ABNORMAL HIGH (ref 70–99)
Glucose-Capillary: 176 mg/dL — ABNORMAL HIGH (ref 70–99)
Glucose-Capillary: 186 mg/dL — ABNORMAL HIGH (ref 70–99)

## 2011-02-28 LAB — COMPREHENSIVE METABOLIC PANEL
ALT: 29 U/L (ref 0–35)
AST: 26 U/L (ref 0–37)
Albumin: 4.5 g/dL (ref 3.5–5.2)
Alkaline Phosphatase: 29 U/L — ABNORMAL LOW (ref 39–117)
BUN: 15 mg/dL (ref 6–23)
CO2: 30 mEq/L (ref 19–32)
Calcium: 9.8 mg/dL (ref 8.4–10.5)
Chloride: 104 mEq/L (ref 96–112)
Creatinine, Ser: 0.86 mg/dL (ref 0.4–1.2)
GFR calc Af Amer: >60 mL/min (ref >60)
GFR calc non Af Amer: >60 mL/min (ref >60)
Glucose, Bld: 89 mg/dL (ref 70–99)
Potassium: 4.8 mEq/L (ref 3.5–5.1)
Sodium: 141 mEq/L (ref 135–145)
Total Bilirubin: 0.7 mg/dL (ref 0.3–1.2)
Total Protein: 7.3 g/dL (ref 6.0–8.3)

## 2011-02-28 LAB — APTT
aPTT: 28 seconds (ref 24–37)
aPTT: 35 seconds (ref 24–37)
aPTT: 39 seconds — ABNORMAL HIGH (ref 24–37)

## 2011-02-28 LAB — PLATELET COUNT: Platelets: 146 10*3/uL — ABNORMAL LOW (ref 150–400)

## 2011-02-28 LAB — PREPARE PLATELET PHERESIS

## 2011-02-28 LAB — URINE MICROSCOPIC-ADD ON

## 2011-02-28 LAB — PREPARE RBC (CROSSMATCH)

## 2011-02-28 LAB — HEMOGLOBIN AND HEMATOCRIT, BLOOD
HCT: 20.7 % — ABNORMAL LOW (ref 36.0–46.0)
Hemoglobin: 7.1 g/dL — CL (ref 12.0–15.0)

## 2011-02-28 LAB — HEMOGLOBIN A1C
Hgb A1c MFr Bld: 5.3 % (ref 4.6–6.1)
Mean Plasma Glucose: 105 mg/dL

## 2011-02-28 LAB — MAGNESIUM
Magnesium: 2.3 mg/dL (ref 1.5–2.5)
Magnesium: 2.5 mg/dL (ref 1.5–2.5)
Magnesium: 2.6 mg/dL — ABNORMAL HIGH (ref 1.5–2.5)

## 2011-02-28 LAB — FIBRINOGEN: Fibrinogen: 155 mg/dL — ABNORMAL LOW (ref 204–475)

## 2011-02-28 LAB — VITAMIN B1: Vitamin B1 (Thiamine): 80 nmol/L — ABNORMAL HIGH (ref 9–44)

## 2011-02-28 LAB — ABO/RH: ABO/RH(D): A POS

## 2011-03-02 LAB — METHYLMALONIC ACID, SERUM: Methylmalonic Acid, Quantitative: 177 nmol/L (ref 87–318)

## 2011-03-04 ENCOUNTER — Encounter: Payer: PRIVATE HEALTH INSURANCE | Admitting: Oncology

## 2011-03-18 ENCOUNTER — Ambulatory Visit: Payer: PRIVATE HEALTH INSURANCE | Admitting: Family Medicine

## 2011-03-20 ENCOUNTER — Ambulatory Visit (INDEPENDENT_AMBULATORY_CARE_PROVIDER_SITE_OTHER): Payer: PRIVATE HEALTH INSURANCE | Admitting: Family Medicine

## 2011-03-20 ENCOUNTER — Other Ambulatory Visit: Payer: Self-pay | Admitting: Family Medicine

## 2011-03-20 ENCOUNTER — Encounter: Payer: Self-pay | Admitting: Family Medicine

## 2011-03-20 ENCOUNTER — Ambulatory Visit (INDEPENDENT_AMBULATORY_CARE_PROVIDER_SITE_OTHER)
Admission: RE | Admit: 2011-03-20 | Discharge: 2011-03-20 | Disposition: A | Discharge status: Home / Self Care | Payer: PRIVATE HEALTH INSURANCE | Source: Ambulatory Visit | Attending: Family Medicine | Admitting: Family Medicine

## 2011-03-20 VITALS — BP 117/70 | HR 77 | Temp 98.0°F | Ht 67.25 in | Wt 218.0 lb

## 2011-03-20 DIAGNOSIS — R2230 Localized swelling, mass and lump, unspecified upper limb: Secondary | ICD-10-CM

## 2011-03-20 DIAGNOSIS — R5381 Other malaise: Secondary | ICD-10-CM

## 2011-03-20 DIAGNOSIS — R229 Localized swelling, mass and lump, unspecified: Secondary | ICD-10-CM

## 2011-03-20 DIAGNOSIS — E0590 Thyrotoxicosis, unspecified without thyrotoxic crisis or storm: Secondary | ICD-10-CM

## 2011-03-20 DIAGNOSIS — D510 Vitamin B12 deficiency anemia due to intrinsic factor deficiency: Secondary | ICD-10-CM

## 2011-03-20 DIAGNOSIS — E538 Deficiency of other specified B group vitamins: Secondary | ICD-10-CM

## 2011-03-20 DIAGNOSIS — L989 Disorder of the skin and subcutaneous tissue, unspecified: Secondary | ICD-10-CM

## 2011-03-20 DIAGNOSIS — R5383 Other fatigue: Secondary | ICD-10-CM

## 2011-03-20 DIAGNOSIS — D649 Anemia, unspecified: Secondary | ICD-10-CM

## 2011-03-20 LAB — CBC
HCT: 35.9 % — ABNORMAL LOW (ref 36.0–46.0)
Hemoglobin: 11.6 g/dL — ABNORMAL LOW (ref 12.0–15.0)
MCH: 28 pg (ref 26.0–34.0)
MCHC: 32.3 g/dL (ref 30.0–36.0)
MCV: 86.5 fL (ref 78.0–100.0)
Platelets: 287 10*3/uL (ref 150–400)
RBC: 4.15 MIL/uL (ref 3.87–5.11)
RDW: 15.3 % (ref 11.5–15.5)
WBC: 6.3 10*3/uL (ref 4.0–10.5)

## 2011-03-20 MED ORDER — CYANOCOBALAMIN 1000 MCG/ML IJ SOLN
1000.0000 ug | Freq: Once | INTRAMUSCULAR | Status: AC
  Administered 2011-03-20: 1000 ug via INTRAMUSCULAR

## 2011-03-20 NOTE — Patient Instructions (Signed)

## 2011-03-23 ENCOUNTER — Encounter: Payer: Self-pay | Admitting: Family Medicine

## 2011-03-23 DIAGNOSIS — L989 Disorder of the skin and subcutaneous tissue, unspecified: Secondary | ICD-10-CM | POA: Insufficient documentation

## 2011-03-23 NOTE — Assessment & Plan Note (Signed)
Is following with Endocrinology, no changes.

## 2011-03-23 NOTE — Assessment & Plan Note (Signed)
Persistent and only slightly better. Will continue to monitor

## 2011-03-23 NOTE — Assessment & Plan Note (Signed)
Tolerating Vitamin B 12 injections monthly

## 2011-03-23 NOTE — Progress Notes (Signed)
[name] [id number] April 13, 1951 03/23/2011      Progress Note-Follow Up  Subjective  Chief Complaint  Chief Complaint  Patient presents with  . Follow-up    1 month follow up    HPI  Patient is a 60 year old Caucasian female who is in today for follow up on multiple medical problems. She continues to struggle with fatigue and history frustrated about this. His following with endocrine her thyroid numbers are improving. Her anemia has been stable and she's had no recent GI changes, bloody or tarry stool. She is noting today at nodule noted on her right upper arm which he says has been present for several months but she had forgotten to mention it. It is slowly enlarging. It is not red or hot. She denies any trauma or skin lesions associated. No chest pain, palpitations, shortness of breath. No recent febrile illness, GU complaints.  Past Medical History  Diagnosis Date  . Anemia   . Anxiety   . Depression   . Diverticulosis   . Elevated cholesterol   . Obesity   . THYROMEGALY 07/22/2010  . Overweight 03/11/2010  . Mixed hyperlipidemia 03/11/2010  . MEASLES, HX OF 03/11/2010  . KNEE PAIN, RIGHT 05/28/2010  . HYPERTRIGLYCERIDEMIA 03/11/2010  . HEART MURMUR, HX OF 03/11/2010  . FIBROIDS, UTERUS 03/11/2010  . Diarrhea 08/05/2010  . CONSTIPATION 03/11/2010  . CHICKENPOX, HX OF 03/11/2010  . BIPOLAR AFFECTIVE DISORDER 03/11/2010  . AORTIC VALVE REPLACEMENT, HX OF 03/11/2010  . ANEMIA 05/28/2010  . Acute bronchitis 07/22/2010  . Abdominal pain, generalized 03/19/2010  . Peripheral edema 11/18/2010  . Bacterial vaginitis 11/18/2010  . Hematuria 12/18/2010  . Diverticulitis 12/18/2010  . Hyperthyroidism 12/18/2010  . Fatigue 12/30/2010  . Renal insufficiency 12/30/2010  . Grave's disease 8-12  . Arm lesion 03/23/2011    Past Surgical History  Procedure Date  . Appendectomy     Required revision for EColi infection, required recurrent packing  . Bowel obstruction     Requiring  adhesions to be removed  . Abdominal hysterectomy     sb/l spo, total  . Aortic valve replacement   . Open heart with a cardiac aneurysm repair   . Cholecystectomy   . Tubal ligation   . Varicose vein surgery b/l legs   . Childhood exploratory surgery of heart   . Hernia repair 08-16-10    Family History  Problem Relation Age of Onset  . Scoliosis Mother   . Arthritis Mother     Rheumatoid  . Aneurysm Mother     heart  . Alcohol abuse Father   . Depression Sister   . Depression Brother   . Alcohol abuse Brother   . Cancer Maternal Grandmother     colon  . Diabetes Maternal Grandfather   . Heart disease Maternal Grandfather   . Alcohol abuse Maternal Grandfather   . Depression Paternal Grandmother   . Diabetes Paternal Grandmother   . Depression Paternal Grandfather   . Diabetes Paternal Grandfather   . Depression Sister   . Alcohol abuse Brother   . Depression Brother   . Heart disease Brother     History   Social History  . Marital Status: Married    Spouse Name: N/A    Number of Children: N/A  . Years of Education: N/A   Occupational History  . Not on file.   Social History Main Topics  . Smoking status: Former Smoker    Quit date: 05/26/1990  . Smokeless tobacco:  Never Used  . Alcohol Use: 0.0 oz/week    0 drink(s) per week     very serious occasions  . Drug Use: No  . Sexually Active: No   Other Topics Concern  . Not on file   Social History Narrative  . No narrative on file    Current Outpatient Prescriptions on File Prior to Visit  Medication Sig Dispense Refill  . acetaminophen (TYLENOL) 325 MG tablet Take 650 mg by mouth every 6 (six) hours as needed.        Marland Kitchen aspirin 81 MG tablet Take 81 mg by mouth daily.        . clonazePAM (KLONOPIN) 0.5 MG tablet Take 0.5 mg by mouth 2 (two) times daily. 1/2 tab       . DULoxetine (CYMBALTA) 30 MG capsule Take 30 mg by mouth daily.        . fenofibrate 160 MG tablet Take 160 mg by mouth daily.          . Ferrous Fumarate-Folic Acid (HEMOCYTE-F) 324-1 MG TABS Take 1 tablet by mouth daily.  30 each  3  . furosemide (LASIX) 20 MG tablet Take 1 tablet (20 mg total) by mouth daily as needed. allergies  30 tablet  2  . lamoTRIgine (LAMICTAL) 150 MG tablet Take 150 mg by mouth daily. 1/2 tab       . methimazole (TAPAZOLE) 10 MG tablet 4 tabs, 2x a day  240 tablet  2  . pantoprazole (PROTONIX) 40 MG tablet TAKE 1 TABLET BY MOUTH EVERY DAY  30 tablet  1  . pravastatin (PRAVACHOL) 40 MG tablet Take 40 mg by mouth at bedtime.        . Probiotic Product (ALIGN) 4 MG CAPS Take 1 capsule by mouth daily.  30 capsule  0  . QUEtiapine (SEROQUEL) 200 MG tablet Take 200 mg by mouth at bedtime. 1-2 tab       . warfarin (COUMADIN) 5 MG tablet Take 5 mg by mouth daily. 1/2 to 1 tab       . Acetaminophen-Codeine (TYLENOL/CODEINE #3) 300-30 MG per tablet Take 1 tablet by mouth every 6 (six) hours as needed. Cough/ pain  40 tablet  1    Allergies  Allergen Reactions  . Sulfonamide Derivatives     Review of Systems  Review of Systems  Constitutional: Positive for malaise/fatigue. Negative for fever.  HENT: Negative for congestion and neck pain.   Eyes: Negative for discharge.  Respiratory: Negative for shortness of breath.   Cardiovascular: Negative for chest pain, palpitations and leg swelling.  Gastrointestinal: Positive for abdominal pain. Negative for nausea, diarrhea, constipation, blood in stool and melena.  Genitourinary: Negative for dysuria.  Musculoskeletal: Positive for myalgias. Negative for falls.       Right arm lesion for a few months, slowly enlarging, not red or hot  Skin: Negative for rash.  Neurological: Negative for loss of consciousness and headaches.  Endo/Heme/Allergies: Negative for polydipsia.  Psychiatric/Behavioral: Negative for depression and suicidal ideas. The patient is not nervous/anxious and does not have insomnia.     Objective  BP 117/70  Pulse 77  Temp(Src) 98 F  (36.7 C) (Oral)  Ht 5' 7.25" (1.708 m)  Wt 218 lb (98.884 kg)  BMI 33.89 kg/m2  SpO2 97%  Physical Exam  Physical Exam  Constitutional: She is oriented to person, place, and time and well-developed, well-nourished, and in no distress. No distress.  HENT:  Head: Normocephalic and atraumatic.  Eyes: Conjunctivae are normal.  Neck: Neck supple. No thyromegaly present.  Cardiovascular: Normal rate and regular rhythm.   Murmur heard. Pulmonary/Chest: Effort normal and breath sounds normal. She has no wheezes.  Abdominal: She exhibits no distension and no mass.  Musculoskeletal: Normal range of motion. She exhibits no edema and no tenderness.       1-2 cm firm lesion at distal end of right bicep. nontender and fixed.  Lymphadenopathy:    She has no cervical adenopathy.  Neurological: She is alert and oriented to person, place, and time.  Skin: Skin is warm and dry. No rash noted. She is not diaphoretic.  Psychiatric: Memory, affect and judgment normal.    Lab Results  Component Value Date   TSH 0.01* 02/24/2011   Lab Results  Component Value Date   WBC 6.3 03/20/2011   HGB 11.6* 03/20/2011   HCT 35.9* 03/20/2011   MCV 86.5 03/20/2011   PLT 287 03/20/2011   Lab Results  Component Value Date   CREATININE 0.9 02/13/2011   BUN 21 02/13/2011   NA 139 02/13/2011   K 4.2 02/13/2011   CL 108 02/13/2011   CO2 24 02/13/2011   Lab Results  Component Value Date   ALT 24 12/16/2010   AST 27 12/16/2010   ALKPHOS 23* 12/16/2010   BILITOT 0.6 12/16/2010   Lab Results  Component Value Date   CHOL 169 12/16/2010   Lab Results  Component Value Date   HDL 43.40 12/16/2010   No results found for this basename: [facility]   Lab Results  Component Value Date   TRIG 244.0* 12/16/2010   Lab Results  Component Value Date   CHOLHDL 4 12/16/2010     Assessment & Plan  Pernicious anemia Tolerating Vitamin B 12 injections monthly  Arm lesion At end of Bicep. Nontender, fixed, xray  unremarkable, will try daily massage with Aspercreme if no improvement will need further imaging  ANEMIA Improving with current regimen  Fatigue Persistent and only slightly better. Will continue to monitor  Hyperthyroidism Is following with Endocrinology, no changes.

## 2011-03-23 NOTE — Assessment & Plan Note (Signed)
At end of Bicep. Nontender, fixed, xray unremarkable, will try daily massage with Aspercreme if no improvement will need further imaging

## 2011-03-23 NOTE — Assessment & Plan Note (Signed)
Improving with current regimen

## 2011-03-27 ENCOUNTER — Telehealth: Payer: Self-pay

## 2011-03-27 NOTE — Telephone Encounter (Signed)
Pt informed and call transferred to Diane to cancel pts appt

## 2011-03-27 NOTE — Telephone Encounter (Signed)
It is largely up to her if it is improving and she wants to wait a few weeks to see if it fully resolves that is probably OK. We can reassess it next month and then decide or she can just proceed with the appt

## 2011-03-27 NOTE — Telephone Encounter (Signed)
Patient left a message wandering if she should keep her appt for her arm because its getting better? Please advise? [phone]

## 2011-04-01 ENCOUNTER — Ambulatory Visit: Payer: PRIVATE HEALTH INSURANCE | Admitting: Endocrinology

## 2011-04-05 ENCOUNTER — Other Ambulatory Visit: Payer: Self-pay | Admitting: Family Medicine

## 2011-04-08 ENCOUNTER — Other Ambulatory Visit (INDEPENDENT_AMBULATORY_CARE_PROVIDER_SITE_OTHER): Payer: PRIVATE HEALTH INSURANCE

## 2011-04-08 ENCOUNTER — Ambulatory Visit (INDEPENDENT_AMBULATORY_CARE_PROVIDER_SITE_OTHER): Payer: PRIVATE HEALTH INSURANCE | Admitting: Endocrinology

## 2011-04-08 ENCOUNTER — Encounter: Payer: Self-pay | Admitting: Endocrinology

## 2011-04-08 VITALS — BP 126/62 | HR 72 | Temp 98.3°F | Ht 67.5 in | Wt 223.0 lb

## 2011-04-08 DIAGNOSIS — E0590 Thyrotoxicosis, unspecified without thyrotoxic crisis or storm: Secondary | ICD-10-CM

## 2011-04-08 LAB — T4, FREE: Free T4: 0.7 ng/dL (ref 0.60–1.60)

## 2011-04-08 LAB — TSH: TSH: 0.03 u[IU]/mL — ABNORMAL LOW (ref 0.35–5.50)

## 2011-04-08 NOTE — Progress Notes (Signed)
Subjective:    Patient ID: [name], female    DOB: 1951/03/14, 60 y.o.   MRN: [id number]  HPI Pt returns for f/u of hyperthyroidism, in the setting of recent amiodarone rx. She says she had a episode of diffuse tremor a few weeks ago, but she is continuing to feel better in general. Past Medical History  Diagnosis Date  . Anemia   . Anxiety   . Depression   . Diverticulosis   . Elevated cholesterol   . Obesity   . THYROMEGALY 07/22/2010  . Overweight 03/11/2010  . Mixed hyperlipidemia 03/11/2010  . MEASLES, HX OF 03/11/2010  . KNEE PAIN, RIGHT 05/28/2010  . HYPERTRIGLYCERIDEMIA 03/11/2010  . HEART MURMUR, HX OF 03/11/2010  . FIBROIDS, UTERUS 03/11/2010  . Diarrhea 08/05/2010  . CONSTIPATION 03/11/2010  . CHICKENPOX, HX OF 03/11/2010  . BIPOLAR AFFECTIVE DISORDER 03/11/2010  . AORTIC VALVE REPLACEMENT, HX OF 03/11/2010  . ANEMIA 05/28/2010  . Acute bronchitis 07/22/2010  . Abdominal pain, generalized 03/19/2010  . Peripheral edema 11/18/2010  . Bacterial vaginitis 11/18/2010  . Hematuria 12/18/2010  . Diverticulitis 12/18/2010  . Hyperthyroidism 12/18/2010  . Fatigue 12/30/2010  . Renal insufficiency 12/30/2010  . Grave's disease 8-12  . Arm lesion 03/23/2011    Past Surgical History  Procedure Date  . Appendectomy     Required revision for EColi infection, required recurrent packing  . Bowel obstruction     Requiring adhesions to be removed  . Abdominal hysterectomy     sb/l spo, total  . Aortic valve replacement   . Open heart with a cardiac aneurysm repair   . Cholecystectomy   . Tubal ligation   . Varicose vein surgery b/l legs   . Childhood exploratory surgery of heart   . Hernia repair 08-16-10    History   Social History  . Marital Status: Married    Spouse Name: N/A    Number of Children: N/A  . Years of Education: N/A   Occupational History  . Not on file.   Social History Main Topics  . Smoking status: Former Smoker    Quit date: 05/26/1990  .  Smokeless tobacco: Never Used  . Alcohol Use: 0.0 oz/week    0 drink(s) per week     very serious occasions  . Drug Use: No  . Sexually Active: No   Other Topics Concern  . Not on file   Social History Narrative  . No narrative on file    Current Outpatient Prescriptions on File Prior to Visit  Medication Sig Dispense Refill  . acetaminophen (TYLENOL) 325 MG tablet Take 650 mg by mouth every 6 (six) hours as needed.        . Acetaminophen-Codeine (TYLENOL/CODEINE #3) 300-30 MG per tablet Take 1 tablet by mouth every 6 (six) hours as needed. Cough/ pain  40 tablet  1  . aspirin 81 MG tablet Take 81 mg by mouth daily.        . clonazePAM (KLONOPIN) 0.5 MG tablet Take 0.5 mg by mouth 2 (two) times daily. 1/2 tab       . DULoxetine (CYMBALTA) 30 MG capsule Take 30 mg by mouth daily.        . fenofibrate 160 MG tablet Take 160 mg by mouth daily.        . Ferrous Fumarate-Folic Acid (HEMOCYTE-F) 324-1 MG TABS Take 1 tablet by mouth daily.  30 each  3  . furosemide (LASIX) 20 MG tablet Take 1 tablet (  20 mg total) by mouth daily as needed. allergies  30 tablet  2  . furosemide (LASIX) 20 MG tablet TAKE 1 TABLET BY MOUTH EVERY DAY FOR 7 DAYS THEN AS NEEDED FOR EDEMA OR WEIGHT GAIN >3 LBS/24 HRS  30 tablet  2  . lamoTRIgine (LAMICTAL) 150 MG tablet Take 150 mg by mouth daily. 1/2 tab       . montelukast (SINGULAIR) 10 MG tablet TAKE 1 TABLET EVERY DAY  30 tablet  1  . pantoprazole (PROTONIX) 40 MG tablet TAKE 1 TABLET BY MOUTH EVERY DAY  30 tablet  1  . pravastatin (PRAVACHOL) 40 MG tablet Take 40 mg by mouth at bedtime.        . Probiotic Product (ALIGN) 4 MG CAPS Take 1 capsule by mouth daily.  30 capsule  0  . QUEtiapine (SEROQUEL) 200 MG tablet Take 200 mg by mouth at bedtime. 1-2 tab       . warfarin (COUMADIN) 5 MG tablet Take 5 mg by mouth daily. 1/2 to 1 tab         Allergies  Allergen Reactions  . Sulfonamide Derivatives     Family History  Problem Relation Age of Onset  .  Scoliosis Mother   . Arthritis Mother     Rheumatoid  . Aneurysm Mother     heart  . Alcohol abuse Father   . Depression Sister   . Depression Brother   . Alcohol abuse Brother   . Cancer Maternal Grandmother     colon  . Diabetes Maternal Grandfather   . Heart disease Maternal Grandfather   . Alcohol abuse Maternal Grandfather   . Depression Paternal Grandmother   . Diabetes Paternal Grandmother   . Depression Paternal Grandfather   . Diabetes Paternal Grandfather   . Depression Sister   . Alcohol abuse Brother   . Depression Brother   . Heart disease Brother     BP 126/62  Pulse 72  Temp(Src) 98.3 F (36.8 C) (Oral)  Ht 5' 7.5" (1.715 m)  Wt 223 lb (101.152 kg)  BMI 34.41 kg/m2  SpO2 98%     Review of Systems Denies fever.    Objective:   Physical Exam VITAL SIGNS:  See vs page GENERAL: no distress Neck: thyroid seems slightly enlarged, but i can't tell details.     Lab Results  Component Value Date   TSH 0.03* 04/08/2011  free t4=normal    Assessment & Plan:  Hyperthyroidism, improved

## 2011-04-08 NOTE — Patient Instructions (Addendum)
blood tests are being requested for you today.  please call [phone] to hear your test results.  You will be prompted to enter the 9-digit "MRN" number that appears at the top left of this page, followed by #.  Then you will hear the message.  Please make a follow-up appointment in 1 month. if ever you have fever while taking methimazole, stop it and call us, because of the risk of a rare side-effect.  (update: i left message on phone-tree:  Reduce tapazole to 10 mg bid)

## 2011-04-21 ENCOUNTER — Encounter: Payer: Self-pay | Admitting: Family Medicine

## 2011-04-21 ENCOUNTER — Ambulatory Visit (INDEPENDENT_AMBULATORY_CARE_PROVIDER_SITE_OTHER): Payer: PRIVATE HEALTH INSURANCE | Admitting: Family Medicine

## 2011-04-21 VITALS — BP 101/65 | HR 76 | Temp 98.0°F | Ht 67.25 in | Wt 227.4 lb

## 2011-04-21 DIAGNOSIS — M79609 Pain in unspecified limb: Secondary | ICD-10-CM

## 2011-04-21 DIAGNOSIS — M79671 Pain in right foot: Secondary | ICD-10-CM

## 2011-04-21 DIAGNOSIS — D649 Anemia, unspecified: Secondary | ICD-10-CM

## 2011-04-21 DIAGNOSIS — K5900 Constipation, unspecified: Secondary | ICD-10-CM

## 2011-04-21 DIAGNOSIS — E0590 Thyrotoxicosis, unspecified without thyrotoxic crisis or storm: Secondary | ICD-10-CM

## 2011-04-21 DIAGNOSIS — E538 Deficiency of other specified B group vitamins: Secondary | ICD-10-CM

## 2011-04-21 DIAGNOSIS — R1084 Generalized abdominal pain: Secondary | ICD-10-CM

## 2011-04-21 LAB — CBC
HCT: 33.4 % — ABNORMAL LOW (ref 36.0–46.0)
Hemoglobin: 11.3 g/dL — ABNORMAL LOW (ref 12.0–15.0)
MCHC: 33.8 g/dL (ref 30.0–36.0)
MCV: 87.7 fl (ref 78.0–100.0)
Platelets: 243 10*3/uL (ref 150.0–400.0)
RBC: 3.81 Mil/uL — ABNORMAL LOW (ref 3.87–5.11)
RDW: 15.4 % — ABNORMAL HIGH (ref 11.5–14.6)
WBC: 4.7 10*3/uL (ref 4.5–10.5)

## 2011-04-21 LAB — URIC ACID: Uric Acid, Serum: 5.5 mg/dL (ref 2.4–7.0)

## 2011-04-21 MED ORDER — DOCUSATE SODIUM 100 MG PO CAPS: 100.0000 mg | ORAL_CAPSULE | Freq: Every day | ORAL | Status: DC | PRN

## 2011-04-21 MED ORDER — OXYCODONE-ACETAMINOPHEN 5-325 MG PO TABS: ORAL_TABLET | ORAL | Status: DC

## 2011-04-21 MED ORDER — CYANOCOBALAMIN 1000 MCG/ML IJ SOLN
1000.0000 ug | Freq: Once | INTRAMUSCULAR | Status: AC
  Administered 2011-04-21: 1000 ug via INTRAMUSCULAR

## 2011-04-21 NOTE — Assessment & Plan Note (Signed)
Following with endocrinology and thyroid labs improving. Will continue to monitor

## 2011-04-21 NOTE — Patient Instructions (Signed)
Gout Gout is an inflammatory condition (arthritis) caused by a buildup of uric acid crystals in the joints. Uric acid is a chemical that is normally present in the blood. Under some circumstances, uric acid can form into crystals in your joints. This causes joint redness, soreness, and swelling (inflammation). Repeat attacks are common. Over time, uric acid crystals can form into masses (tophi) near a joint, causing disfigurement. Gout is treatable and often preventable. CAUSES  The disease begins with elevated levels of uric acid in the blood. Uric acid is produced by your body when it breaks down a naturally found substance called purines. This also happens when you eat certain foods such as meats and fish. Causes of an elevated uric acid level include:  Being passed down from parent to child (heredity).   Diseases that cause increased uric acid production (obesity, psoriasis, some cancers).   Excessive alcohol use.   Diet, especially diets rich in meat and seafood.   Medicines, including certain cancer-fighting drugs (chemotherapy), diuretics, and aspirin.   Chronic kidney disease. The kidneys are no longer able to remove uric acid well.   Problems with metabolism.  Conditions strongly associated with gout include:  Obesity.   High blood pressure.   High cholesterol.   Diabetes.  Not everyone with elevated uric acid levels gets gout. It is not understood why some people get gout and others do not. Surgery, joint injury, and eating too much of certain foods are some of the factors that can lead to gout. SYMPTOMS   An attack of gout comes on quickly. It causes intense pain with redness, swelling, and warmth in a joint.   Fever can occur.   Often, only one joint is involved. Certain joints are more commonly involved:   Base of the big toe.   Knee.   Ankle.   Wrist.   Finger.  Without treatment, an attack usually goes away in a few days to weeks. Between attacks, you  usually will not have symptoms, which is different from many other forms of arthritis. DIAGNOSIS  Your caregiver will suspect gout based on your symptoms and exam. Removal of fluid from the joint (arthrocentesis) is done to check for uric acid crystals. Your caregiver will give you a medicine that numbs the area (local anesthetic) and use a needle to remove joint fluid for exam. Gout is confirmed when uric acid crystals are seen in joint fluid, using a special microscope. Sometimes, blood, urine, and X-ray tests are also used. TREATMENT  There are 2 phases to gout treatment: treating the sudden onset (acute) attack and preventing attacks (prophylaxis). Treatment of an Acute Attack  Medicines are used. These include anti-inflammatory medicines or steroid medicines.   An injection of steroid medicine into the affected joint is sometimes necessary.   The painful joint is rested. Movement can worsen the arthritis.   You may use warm or cold treatments on painful joints, depending which works best for you.   Discuss the use of coffee, vitamin C, or cherries with your caregiver. These may be helpful treatment options.  Treatment to Prevent Attacks After the acute attack subsides, your caregiver may advise prophylactic medicine. These medicines either help your kidneys eliminate uric acid from your body or decrease your uric acid production. You may need to stay on these medicines for a very long time. The early phase of treatment with prophylactic medicine can be associated with an increase in acute gout attacks. For this reason, during the first few months   of treatment, your caregiver may also advise you to take medicines usually used for acute gout treatment. Be sure you understand your caregiver's directions. You should also discuss dietary treatment with your caregiver. Certain foods such as meats and fish can increase uric acid levels. Other foods such as dairy can decrease levels. Your caregiver  can give you a list of foods to avoid. HOME CARE INSTRUCTIONS   Do not take aspirin to relieve pain. This raises uric acid levels.   Only take over-the-counter or prescription medicines for pain, discomfort, or fever as directed by your caregiver.   Rest the joint as much as possible. When in bed, keep sheets and blankets off painful areas.   Keep the affected joint raised (elevated).   Use crutches if the painful joint is in your leg.   Drink enough water and fluids to keep your urine clear or pale yellow. This helps your body get rid of uric acid. Do not drink alcoholic beverages. They slow the passage of uric acid.   Follow your caregiver's dietary instructions. Pay careful attention to the amount of protein you eat. Your daily diet should emphasize fruits, vegetables, whole grains, and fat-free or low-fat milk products.   Maintain a healthy body weight.  SEEK MEDICAL CARE IF:   You have an oral temperature above 102 F (38.9 C).   You develop diarrhea, vomiting, or any side effects from medicines.   You do not feel better in 24 hours, or you are getting worse.  SEEK IMMEDIATE MEDICAL CARE IF:   Your joint becomes suddenly more tender and you have:   Chills.   An oral temperature above 102 F (38.9 C), not controlled by medicine.  MAKE SURE YOU:   Understand these instructions.   Will watch your condition.   Will get help right away if you are not doing well or get worse.  Document Released: 05/09/2000 Document Revised: 01/22/2011 Document Reviewed: 08/20/2009 [facility] Patient Information 2012 [place], [place]. Get the shoe inserts, add ice and aspsesrcreme twice daily

## 2011-04-21 NOTE — Progress Notes (Signed)
[name] [id number] 27-Jun-1950 04/21/2011      Progress Note-Follow Up  Subjective  Chief Complaint  Chief Complaint  Patient presents with  . Follow-up    1 month follow up    HPI  Patient is a [age]-year-old Caucasian female who is in today for followup of abdominal pain but also struggling with significant congestion and cough. She reports over the last several weeks she's had increased cough symptoms and spasms are worse at night. Sometimes productive of green sputum. Sputum production is alert in the morning. No obvious fevers and chills with some mild malaise is present. Is also complaining of bilateral foot pain worse in the right foot present for over a year but worsening. She describes swelling at the base of the metatarsals caudally but denies any warmth, erythema or with tenderness to touch. No recent trauma. She does have flat arches and has had these for many years.  Past Medical History  Diagnosis Date  . Anemia   . Anxiety   . Depression   . Diverticulosis   . Elevated cholesterol   . Obesity   . THYROMEGALY 07/22/2010  . Overweight 03/11/2010  . Mixed hyperlipidemia 03/11/2010  . MEASLES, HX OF 03/11/2010  . KNEE PAIN, RIGHT 05/28/2010  . HYPERTRIGLYCERIDEMIA 03/11/2010  . HEART MURMUR, HX OF 03/11/2010  . FIBROIDS, UTERUS 03/11/2010  . Diarrhea 08/05/2010  . CONSTIPATION 03/11/2010  . CHICKENPOX, HX OF 03/11/2010  . BIPOLAR AFFECTIVE DISORDER 03/11/2010  . AORTIC VALVE REPLACEMENT, HX OF 03/11/2010  . ANEMIA 05/28/2010  . Acute bronchitis 07/22/2010  . Abdominal pain, generalized 03/19/2010  . Peripheral edema 11/18/2010  . Bacterial vaginitis 11/18/2010  . Hematuria 12/18/2010  . Diverticulitis 12/18/2010  . Hyperthyroidism 12/18/2010  . Fatigue 12/30/2010  . Renal insufficiency 12/30/2010  . Grave's disease 8-12  . Arm lesion 03/23/2011    Past Surgical History  Procedure Date  . Appendectomy     Required revision for EColi infection, required recurrent  packing  . Bowel obstruction     Requiring adhesions to be removed  . Abdominal hysterectomy     sb/l spo, total  . Aortic valve replacement   . Open heart with a cardiac aneurysm repair   . Cholecystectomy   . Tubal ligation   . Varicose vein surgery b/l legs   . Childhood exploratory surgery of heart   . Hernia repair 08-16-10    Family History  Problem Relation Age of Onset  . Scoliosis Mother   . Arthritis Mother     Rheumatoid  . Aneurysm Mother     heart  . Alcohol abuse Father   . Depression Sister   . Depression Brother   . Alcohol abuse Brother   . Cancer Maternal Grandmother     colon  . Diabetes Maternal Grandfather   . Heart disease Maternal Grandfather   . Alcohol abuse Maternal Grandfather   . Depression Paternal Grandmother   . Diabetes Paternal Grandmother   . Depression Paternal Grandfather   . Diabetes Paternal Grandfather   . Depression Sister   . Alcohol abuse Brother   . Depression Brother   . Heart disease Brother     History   Social History  . Marital Status: Married    Spouse Name: N/A    Number of Children: N/A  . Years of Education: N/A   Occupational History  . Not on file.   Social History Main Topics  . Smoking status: Former Engineer, civil (consulting)  date: 05/26/1990  . Smokeless tobacco: Never Used  . Alcohol Use: 0.0 oz/week    0 drink(s) per week     very serious occasions  . Drug Use: No  . Sexually Active: No   Other Topics Concern  . Not on file   Social History Narrative  . No narrative on file    Current Outpatient Prescriptions on File Prior to Visit  Medication Sig Dispense Refill  . acetaminophen (TYLENOL) 325 MG tablet Take 650 mg by mouth every 6 (six) hours as needed.        Marland Kitchen aspirin 81 MG tablet Take 81 mg by mouth daily.        . clonazePAM (KLONOPIN) 0.5 MG tablet Take 0.5 mg by mouth 2 (two) times daily. 1/2 tab       . DULoxetine (CYMBALTA) 30 MG capsule Take 30 mg by mouth daily.        . fenofibrate 160  MG tablet Take 160 mg by mouth daily.        . Ferrous Fumarate-Folic Acid (HEMOCYTE-F) 324-1 MG TABS Take 1 tablet by mouth daily.  30 each  3  . furosemide (LASIX) 20 MG tablet Take 1 tablet (20 mg total) by mouth daily as needed. allergies  30 tablet  2  . lamoTRIgine (LAMICTAL) 150 MG tablet Take 150 mg by mouth daily. 1/2 tab       . methimazole (TAPAZOLE) 10 MG tablet Take 10 mg by mouth 2 (two) times daily.        . montelukast (SINGULAIR) 10 MG tablet TAKE 1 TABLET EVERY DAY  30 tablet  1  . pantoprazole (PROTONIX) 40 MG tablet TAKE 1 TABLET BY MOUTH EVERY DAY  30 tablet  1  . pravastatin (PRAVACHOL) 40 MG tablet Take 40 mg by mouth at bedtime.        . Probiotic Product (ALIGN) 4 MG CAPS Take 1 capsule by mouth daily.  30 capsule  0  . QUEtiapine (SEROQUEL) 200 MG tablet Take 200 mg by mouth at bedtime. 1-2 tab       . warfarin (COUMADIN) 5 MG tablet Take 5 mg by mouth daily. 1/2 to 1 tab         Allergies  Allergen Reactions  . Sulfonamide Derivatives     Review of Systems  Review of Systems  Constitutional: Positive for malaise/fatigue. Negative for fever and chills.  HENT: Positive for congestion. Negative for nosebleeds and sore throat.        Generalized HA, rhinorrhea was green now clear, has hoarseness secondary to coughing  Eyes: Negative for discharge.  Respiratory: Positive for cough, sputum production and wheezing. Negative for shortness of breath.   Cardiovascular: Negative for chest pain, palpitations and leg swelling.  Gastrointestinal: Negative for nausea, abdominal pain and diarrhea.  Genitourinary: Negative for dysuria.  Musculoskeletal: Negative for falls.  Skin: Negative for rash.  Neurological: Positive for headaches. Negative for loss of consciousness.  Endo/Heme/Allergies: Negative for polydipsia.  Psychiatric/Behavioral: Negative for depression and suicidal ideas. The patient is not nervous/anxious and does not have insomnia.     Objective  BP  101/65  Pulse 76  Temp(Src) 98 F (36.7 C) (Oral)  Ht 5' 7.25" (1.708 m)  Wt 227 lb 6.4 oz (103.148 kg)  BMI 35.35 kg/m2  SpO2 96%  Physical Exam  Physical Exam  Constitutional: She is oriented to person, place, and time and well-developed, well-nourished, and in no distress. No distress.  HENT:  Head:  Normocephalic and atraumatic.  Eyes: Conjunctivae are normal.  Neck: Neck supple. No thyromegaly present.  Cardiovascular: Normal rate and regular rhythm.   Murmur heard. Pulmonary/Chest: Effort normal and breath sounds normal. She has no wheezes.  Abdominal: She exhibits no distension and no mass.  Musculoskeletal: She exhibits no edema and no tenderness.       Flat arches with inversion at ankles. Mild swelling at base or 2-4 metatarsals right foot  Lymphadenopathy:    She has no cervical adenopathy.  Neurological: She is alert and oriented to person, place, and time.  Skin: Skin is warm and dry. No rash noted. She is not diaphoretic.  Psychiatric: Memory, affect and judgment normal.    Lab Results  Component Value Date   TSH 0.03* 04/08/2011   Lab Results  Component Value Date   WBC 6.3 03/20/2011   HGB 11.6* 03/20/2011   HCT 35.9* 03/20/2011   MCV 86.5 03/20/2011   PLT 287 03/20/2011   Lab Results  Component Value Date   CREATININE 0.9 02/13/2011   BUN 21 02/13/2011   NA 139 02/13/2011   K 4.2 02/13/2011   CL 108 02/13/2011   CO2 24 02/13/2011   Lab Results  Component Value Date   ALT 24 12/16/2010   AST 27 12/16/2010   ALKPHOS 23* 12/16/2010   BILITOT 0.6 12/16/2010   Lab Results  Component Value Date   CHOL 169 12/16/2010   Lab Results  Component Value Date   HDL 43.40 12/16/2010   No results found for this basename: [facility]   Lab Results  Component Value Date   TRIG 244.0* 12/16/2010   Lab Results  Component Value Date   CHOLHDL 4 12/16/2010     Assessment & Plan  Hyperthyroidism Following with endocrinology and thyroid labs improving. Will  continue to monitor  ABDOMINAL PAIN, GENERALIZED Improving, no change in therapy  ANEMIA Stable hgb, continue hi iron intake, given a Vitamin B12 shot today and then monthly  Foot pain, right With fallen arches, encouraged to wear good foot wear. Use shoe inserts ice and apply Aspercreme bid as well, report if no improvement    Given a small amount of percocets to use dialy for severe foot pain

## 2011-04-21 NOTE — Assessment & Plan Note (Addendum)
Stable hgb, continue hi iron intake, given a Vitamin B12 shot today and then monthly

## 2011-04-21 NOTE — Assessment & Plan Note (Signed)
With fallen arches, encouraged to wear good foot wear. Use shoe inserts ice and apply Aspercreme bid as well, report if no improvement

## 2011-04-21 NOTE — Assessment & Plan Note (Signed)
Improving, no change in therapy

## 2011-05-08 ENCOUNTER — Encounter: Payer: Self-pay | Admitting: Endocrinology

## 2011-05-08 ENCOUNTER — Ambulatory Visit (INDEPENDENT_AMBULATORY_CARE_PROVIDER_SITE_OTHER): Payer: PRIVATE HEALTH INSURANCE | Admitting: Endocrinology

## 2011-05-08 ENCOUNTER — Other Ambulatory Visit (INDEPENDENT_AMBULATORY_CARE_PROVIDER_SITE_OTHER): Payer: PRIVATE HEALTH INSURANCE

## 2011-05-08 VITALS — BP 124/72 | HR 77 | Temp 98.4°F | Ht 68.0 in | Wt 224.0 lb

## 2011-05-08 DIAGNOSIS — E0590 Thyrotoxicosis, unspecified without thyrotoxic crisis or storm: Secondary | ICD-10-CM

## 2011-05-08 LAB — TSH: TSH: 1.65 u[IU]/mL (ref 0.35–5.50)

## 2011-05-08 LAB — T4, FREE: Free T4: 0.48 ng/dL — ABNORMAL LOW (ref 0.60–1.60)

## 2011-05-08 MED ORDER — METHIMAZOLE 10 MG PO TABS: 10.0000 mg | ORAL_TABLET | Freq: Every day | ORAL | Status: DC

## 2011-05-08 NOTE — Progress Notes (Signed)
Subjective:    Patient ID: [name], female    DOB: 1950-05-27, 60 y.o.   MRN: [id number]  HPI Pt returns for f/u of hyperthyroidism, in the setting of recent amiodarone rx (which she had to d/c, due to renal insuff).  She takes tapazole as rx'ed.  pt states she feels well in general.  Denies palpitations.  She is still taking tapazole 20-bid.  Past Medical History  Diagnosis Date  . Anemia   . Anxiety   . Depression   . Diverticulosis   . Elevated cholesterol   . Obesity   . THYROMEGALY 07/22/2010  . Overweight 03/11/2010  . Mixed hyperlipidemia 03/11/2010  . MEASLES, HX OF 03/11/2010  . KNEE PAIN, RIGHT 05/28/2010  . HYPERTRIGLYCERIDEMIA 03/11/2010  . HEART MURMUR, HX OF 03/11/2010  . FIBROIDS, UTERUS 03/11/2010  . Diarrhea 08/05/2010  . CONSTIPATION 03/11/2010  . CHICKENPOX, HX OF 03/11/2010  . BIPOLAR AFFECTIVE DISORDER 03/11/2010  . AORTIC VALVE REPLACEMENT, HX OF 03/11/2010  . ANEMIA 05/28/2010  . Acute bronchitis 07/22/2010  . Abdominal pain, generalized 03/19/2010  . Peripheral edema 11/18/2010  . Bacterial vaginitis 11/18/2010  . Hematuria 12/18/2010  . Diverticulitis 12/18/2010  . Hyperthyroidism 12/18/2010  . Fatigue 12/30/2010  . Renal insufficiency 12/30/2010  . Grave's disease 8-12  . Arm lesion 03/23/2011    Past Surgical History  Procedure Date  . Appendectomy     Required revision for EColi infection, required recurrent packing  . Bowel obstruction     Requiring adhesions to be removed  . Abdominal hysterectomy     sb/l spo, total  . Aortic valve replacement   . Open heart with a cardiac aneurysm repair   . Cholecystectomy   . Tubal ligation   . Varicose vein surgery b/l legs   . Childhood exploratory surgery of heart   . Hernia repair 08-16-10    History   Social History  . Marital Status: Married    Spouse Name: N/A    Number of Children: N/A  . Years of Education: N/A   Occupational History  . Not on file.   Social History Main Topics  .  Smoking status: Former Smoker    Quit date: 05/26/1990  . Smokeless tobacco: Never Used  . Alcohol Use: 0.0 oz/week    0 drink(s) per week     very serious occasions  . Drug Use: No  . Sexually Active: No   Other Topics Concern  . Not on file   Social History Narrative  . No narrative on file    Current Outpatient Prescriptions on File Prior to Visit  Medication Sig Dispense Refill  . acetaminophen (TYLENOL) 325 MG tablet Take 650 mg by mouth every 6 (six) hours as needed.        Marland Kitchen aspirin 81 MG tablet Take 81 mg by mouth daily.        . clonazePAM (KLONOPIN) 0.5 MG tablet Take 0.5 mg by mouth 2 (two) times daily. 1/2 tab       . docusate sodium (COLACE) 100 MG capsule Take 1 capsule (100 mg total) by mouth daily as needed for constipation.  30 capsule  2  . DULoxetine (CYMBALTA) 30 MG capsule Take 30 mg by mouth daily.        . fenofibrate 160 MG tablet Take 160 mg by mouth daily.        . Ferrous Fumarate-Folic Acid (HEMOCYTE-F) 324-1 MG TABS Take 1 tablet by mouth daily.  30 each  3  . furosemide (LASIX) 20 MG tablet Take 1 tablet (20 mg total) by mouth daily as needed. allergies  30 tablet  2  . lamoTRIgine (LAMICTAL) 150 MG tablet Take 150 mg by mouth daily. 1/2 tab       . montelukast (SINGULAIR) 10 MG tablet TAKE 1 TABLET EVERY DAY  30 tablet  1  . oxyCODONE-acetaminophen (ROXICET) 5-325 MG per tablet 1 tab po bid prn pain  60 tablet  0  . pantoprazole (PROTONIX) 40 MG tablet TAKE 1 TABLET BY MOUTH EVERY DAY  30 tablet  1  . pravastatin (PRAVACHOL) 40 MG tablet Take 40 mg by mouth at bedtime.        . Probiotic Product (ALIGN) 4 MG CAPS Take 1 capsule by mouth daily.  30 capsule  0  . QUEtiapine (SEROQUEL) 200 MG tablet Take 200 mg by mouth at bedtime. 1-2 tab       . warfarin (COUMADIN) 5 MG tablet Take 5 mg by mouth daily. 1/2 to 1 tab         Allergies  Allergen Reactions  . Sulfonamide Derivatives     Family History  Problem Relation Age of Onset  . Scoliosis  Mother   . Arthritis Mother     Rheumatoid  . Aneurysm Mother     heart  . Alcohol abuse Father   . Depression Sister   . Depression Brother   . Alcohol abuse Brother   . Cancer Maternal Grandmother     colon  . Diabetes Maternal Grandfather   . Heart disease Maternal Grandfather   . Alcohol abuse Maternal Grandfather   . Depression Paternal Grandmother   . Diabetes Paternal Grandmother   . Depression Paternal Grandfather   . Diabetes Paternal Grandfather   . Depression Sister   . Alcohol abuse Brother   . Depression Brother   . Heart disease Brother    BP 124/72  Pulse 77  Temp(Src) 98.4 F (36.9 C) (Oral)  Ht 5\' 8"  (1.727 m)  Wt 224 lb (101.606 kg)  BMI 34.06 kg/m2  SpO2 97%  Review of Systems Denies fever    Objective:   Physical Exam VITAL SIGNS:  See vs page GENERAL: no distress. Neck: thyroid is slightly and diffusely enlarged  Lab Results  Component Value Date   TSH 1.65 05/08/2011  (i also reviewed free T4)    Assessment & Plan:  Hyperthyroidism, slightly overcontrolled

## 2011-05-08 NOTE — Patient Instructions (Addendum)
blood tests are being requested for you today.  please call [phone] to hear your test results.  You will be prompted to enter the 9-digit "MRN" number that appears at the top left of this page, followed by #.  Then you will hear the message.  Please make a follow-up appointment in 6 weeks. if ever you have fever while taking methimazole, stop it and call us, because of the risk of a rare side-effect.  (update: i left message on phone-tree:  Reduce tapazole to 10/d)

## 2011-05-15 ENCOUNTER — Other Ambulatory Visit: Payer: Self-pay | Admitting: Family Medicine

## 2011-05-15 ENCOUNTER — Encounter: Payer: Self-pay | Admitting: Family Medicine

## 2011-05-15 MED ORDER — FERROUS FUMARATE-FOLIC ACID 324-1 MG PO TABS: 1.0000 | ORAL_TABLET | Freq: Every day | ORAL | Status: DC

## 2011-05-15 MED ORDER — PRAVASTATIN SODIUM 40 MG PO TABS: 40.0000 mg | ORAL_TABLET | Freq: Every day | ORAL | Status: DC

## 2011-05-15 MED ORDER — CLONAZEPAM 0.5 MG PO TABS: 0.5000 mg | ORAL_TABLET | Freq: Two times a day (BID) | ORAL | Status: DC

## 2011-05-15 MED ORDER — PANTOPRAZOLE SODIUM 40 MG PO TBEC: 40.0000 mg | DELAYED_RELEASE_TABLET | Freq: Every day | ORAL | Status: DC

## 2011-05-15 NOTE — Telephone Encounter (Signed)
Patient needs refills of clonazepam, hemocyte, pantoprazole, pravastatin

## 2011-05-15 NOTE — Telephone Encounter (Signed)
Error

## 2011-05-22 ENCOUNTER — Ambulatory Visit (INDEPENDENT_AMBULATORY_CARE_PROVIDER_SITE_OTHER): Payer: PRIVATE HEALTH INSURANCE

## 2011-05-22 ENCOUNTER — Other Ambulatory Visit: Payer: Self-pay | Admitting: Family Medicine

## 2011-05-22 DIAGNOSIS — R350 Frequency of micturition: Secondary | ICD-10-CM

## 2011-05-22 DIAGNOSIS — E538 Deficiency of other specified B group vitamins: Secondary | ICD-10-CM

## 2011-05-22 LAB — POCT URINALYSIS DIPSTICK
Bilirubin, UA: NEGATIVE
Glucose, UA: NEGATIVE
Ketones, UA: NEGATIVE
Nitrite, UA: NEGATIVE
Protein, UA: NEGATIVE
Spec Grav, UA: 1.01
Urobilinogen, UA: 0.2
pH, UA: 5.5

## 2011-05-22 MED ORDER — CYANOCOBALAMIN 1000 MCG/ML IJ SOLN
1000.0000 ug | Freq: Once | INTRAMUSCULAR | Status: AC
  Administered 2011-05-22: 1000 ug via INTRAMUSCULAR

## 2011-05-22 NOTE — Progress Notes (Signed)
Addended by: Baldemar Lenis R on: 05/22/2011 05:12 PM   Modules accepted: Orders

## 2011-05-25 ENCOUNTER — Other Ambulatory Visit: Payer: Self-pay | Admitting: Family Medicine

## 2011-05-25 LAB — URINE CULTURE: Colony Count: >=100000

## 2011-05-25 MED ORDER — CIPROFLOXACIN HCL 500 MG PO TABS: 500.0000 mg | ORAL_TABLET | Freq: Two times a day (BID) | ORAL | Status: AC

## 2011-05-25 NOTE — Progress Notes (Signed)
Quick Note:  Notified pt of results of urine clx: E. Coli, pansensitive. She verified urinary urgency and frequency, plus some recent low back pains today. No fever, no flank pain, no upper back pain, no n/v. Rx sent to her pharmacy for cipro 500mg  bid x 3d. Told her to call if all sx's not resolved after this 3d course of abx.--PM ______

## 2011-06-04 ENCOUNTER — Telehealth: Payer: Self-pay | Admitting: Internal Medicine

## 2011-06-04 NOTE — Telephone Encounter (Signed)
Talked to pt, gave her appt for 06/16/11 Financial, lab and MD, gave her telephone number and location

## 2011-06-05 ENCOUNTER — Telehealth: Payer: Self-pay | Admitting: Internal Medicine

## 2011-06-05 NOTE — Telephone Encounter (Signed)
Referred by Dr. Danise Edge Dx- Chronic Anemia

## 2011-06-11 ENCOUNTER — Ambulatory Visit: Payer: PRIVATE HEALTH INSURANCE | Admitting: Internal Medicine

## 2011-06-15 ENCOUNTER — Other Ambulatory Visit: Payer: Self-pay | Admitting: Internal Medicine

## 2011-06-15 DIAGNOSIS — D649 Anemia, unspecified: Secondary | ICD-10-CM

## 2011-06-15 DIAGNOSIS — D539 Nutritional anemia, unspecified: Secondary | ICD-10-CM

## 2011-06-16 ENCOUNTER — Ambulatory Visit (HOSPITAL_BASED_OUTPATIENT_CLINIC_OR_DEPARTMENT_OTHER): Payer: PRIVATE HEALTH INSURANCE | Admitting: Internal Medicine

## 2011-06-16 ENCOUNTER — Ambulatory Visit: Payer: PRIVATE HEALTH INSURANCE

## 2011-06-16 ENCOUNTER — Other Ambulatory Visit: Payer: PRIVATE HEALTH INSURANCE | Admitting: Lab

## 2011-06-16 VITALS — BP 136/78 | HR 67 | Temp 97.6°F | Ht 66.0 in | Wt 227.5 lb

## 2011-06-16 DIAGNOSIS — D539 Nutritional anemia, unspecified: Secondary | ICD-10-CM

## 2011-06-16 DIAGNOSIS — D649 Anemia, unspecified: Secondary | ICD-10-CM

## 2011-06-16 LAB — CBC & DIFF AND RETIC
BASO%: 1.8 % (ref 0.0–2.0)
Basophils Absolute: 0.1 10*3/uL (ref 0.0–0.1)
EOS%: 5 % (ref 0.0–7.0)
Eosinophils Absolute: 0.2 10*3/uL (ref 0.0–0.5)
HCT: 35.6 % (ref 34.8–46.6)
HGB: 12 g/dL (ref 11.6–15.9)
Immature Retic Fract: 8.9 % (ref 1.60–10.00)
LYMPH%: 39 % (ref 14.0–49.7)
MCH: 29.2 pg (ref 25.1–34.0)
MCHC: 33.7 g/dL (ref 31.5–36.0)
MCV: 86.6 fL (ref 79.5–101.0)
MONO#: 0.3 10*3/uL (ref 0.1–0.9)
MONO%: 6.8 % (ref 0.0–14.0)
NEUT#: 2.1 10*3/uL (ref 1.5–6.5)
NEUT%: 47.4 % (ref 38.4–76.8)
Platelets: 233 10*3/uL (ref 145–400)
RBC: 4.11 10*6/uL (ref 3.70–5.45)
RDW: 14.1 % (ref 11.2–14.5)
Retic %: 1.3 % (ref 0.70–2.10)
Retic Ct Abs: 53.43 10*3/uL (ref 33.70–90.70)
WBC: 4.4 10*3/uL (ref 3.9–10.3)
lymph#: 1.7 10*3/uL (ref 0.9–3.3)

## 2011-06-16 LAB — MORPHOLOGY
PLT EST: ADEQUATE
RBC Comments: NORMAL

## 2011-06-16 LAB — CHCC SMEAR

## 2011-06-16 NOTE — Progress Notes (Signed)
[facility] CONSULT NOTE  REASON FOR CONSULTATION:  61 years old white female with persistent anemia. HPI [name] is a 61 y.o. female [name] medical history significant for a persistent anemia the patient also has a history of anxiety/depression, dyslipidemia, moderate obesity, Grave's disease, bipolar disorder, renal insufficiency as well as history of aortic valve replacement in 2008. The patient is currently on Coumadin secondary to the aortic valve replacement. She was noted to have Hemoccult positive stool and no July of 2012. She also had hematuria at that time. Her last colonoscopy was in 2008. She continues to have occasional bleeding as well as easy bruising especially with the current treatment with Coumadin. on 12/14/2010 her hemoglobin was 9.7 and hematocrit of 29.1. The patient was started on treatment I'm supplement in the form of hemocyte-f in addition to vitamin B12 injection. Her hemoglobin improved over the last few months. On 04/21/2011 hemoglobin was 11.3 and hematocrit 33.4. Because of the persistent anemia the patient was referred to me today for evaluation and recommendation regarding her treatment. She has no complaints today except for mild fatigue and shortness of breath with exertion. She denied having any significant weight loss or night sweats. No dizzy spells. No nausea or vomiting, no change in her bowel movement. The patient is married and has 2 children. She does not smoke and drinks alcohol occasionally.  @SFHPI @  Past Medical History  Diagnosis Date  . Anemia   . Anxiety   . Depression   . Diverticulosis   . Elevated cholesterol   . Obesity   . THYROMEGALY 07/22/2010  . Overweight 03/11/2010  . Mixed hyperlipidemia 03/11/2010  . MEASLES, HX OF 03/11/2010  . KNEE PAIN, RIGHT 05/28/2010  . HYPERTRIGLYCERIDEMIA 03/11/2010  . HEART MURMUR, HX OF 03/11/2010  . FIBROIDS, UTERUS 03/11/2010  . Diarrhea 08/05/2010  . CONSTIPATION 03/11/2010  .  CHICKENPOX, HX OF 03/11/2010  . BIPOLAR AFFECTIVE DISORDER 03/11/2010  . AORTIC VALVE REPLACEMENT, HX OF 03/11/2010  . ANEMIA 05/28/2010  . Acute bronchitis 07/22/2010  . Abdominal pain, generalized 03/19/2010  . Peripheral edema 11/18/2010  . Bacterial vaginitis 11/18/2010  . Hematuria 12/18/2010  . Diverticulitis 12/18/2010  . Hyperthyroidism 12/18/2010  . Fatigue 12/30/2010  . Renal insufficiency 12/30/2010  . Grave's disease 8-12  . Arm lesion 03/23/2011    Past Surgical History  Procedure Date  . Appendectomy     Required revision for EColi infection, required recurrent packing  . Bowel obstruction     Requiring adhesions to be removed  . Abdominal hysterectomy     sb/l spo, total  . Aortic valve replacement   . Open heart with a cardiac aneurysm repair   . Cholecystectomy   . Tubal ligation   . Varicose vein surgery b/l legs   . Childhood exploratory surgery of heart   . Hernia repair 08-16-10    Family History  Problem Relation Age of Onset  . Scoliosis Mother   . Arthritis Mother     Rheumatoid  . Aneurysm Mother     heart  . Alcohol abuse Father   . Depression Sister   . Depression Brother   . Alcohol abuse Brother   . Cancer Maternal Grandmother     colon  . Diabetes Maternal Grandfather   . Heart disease Maternal Grandfather   . Alcohol abuse Maternal Grandfather   . Depression Paternal Grandmother   . Diabetes Paternal Grandmother   . Depression Paternal Grandfather   . Diabetes Paternal Grandfather   .  Depression Sister   . Alcohol abuse Brother   . Depression Brother   . Heart disease Brother     Social History History  Substance Use Topics  . Smoking status: Former Smoker    Quit date: 05/26/1990  . Smokeless tobacco: Never Used  . Alcohol Use: 0.0 oz/week    0 drink(s) per week     very serious occasions    Allergies  Allergen Reactions  . Sulfonamide Derivatives     Current Outpatient Prescriptions  Medication Sig Dispense Refill  .  aspirin 81 MG tablet Take 81 mg by mouth daily.        . clonazePAM (KLONOPIN) 0.5 MG tablet Take 1 tablet (0.5 mg total) by mouth 2 (two) times daily. 1/2 tab  30 tablet  1  . cyanocobalamin 1000 MCG tablet Inject 1,000 mcg as directed every 30 (thirty) days.      . DULoxetine (CYMBALTA) 30 MG capsule Take 30 mg by mouth daily.        . fenofibrate 160 MG tablet Take 160 mg by mouth daily.        . Ferrous Fumarate-Folic Acid (HEMOCYTE-F) 324-1 MG TABS Take 1 tablet by mouth daily.  30 each  3  . lamoTRIgine (LAMICTAL) 150 MG tablet Take 150 mg by mouth daily. 1/2 tab       . methimazole (TAPAZOLE) 10 MG tablet Take 1 tablet (10 mg total) by mouth daily.  30 tablet  3  . montelukast (SINGULAIR) 10 MG tablet TAKE 1 TABLET EVERY DAY  30 tablet  1  . pantoprazole (PROTONIX) 40 MG tablet Take 1 tablet (40 mg total) by mouth daily.  30 tablet  1  . pravastatin (PRAVACHOL) 40 MG tablet Take 1 tablet (40 mg total) by mouth at bedtime.  30 tablet  1  . Probiotic Product (ALIGN) 4 MG CAPS Take 1 capsule by mouth daily.  30 capsule  0  . QUEtiapine (SEROQUEL) 200 MG tablet Take 200 mg by mouth at bedtime. 1-2 tab       . warfarin (COUMADIN) 5 MG tablet Take 5 mg by mouth daily. 1/2 to 1 tab       . acetaminophen (TYLENOL) 325 MG tablet Take 650 mg by mouth every 6 (six) hours as needed.        . docusate sodium (COLACE) 100 MG capsule Take 1 capsule (100 mg total) by mouth daily as needed for constipation.  30 capsule  2  . furosemide (LASIX) 20 MG tablet Take 1 tablet (20 mg total) by mouth daily as needed. allergies  30 tablet  2  . oxyCODONE-acetaminophen (ROXICET) 5-325 MG per tablet 1 tab po bid prn pain  60 tablet  0    Review of Systems  A comprehensive review of systems was negative except for: Constitutional: positive for fatigue Respiratory: positive for dyspnea on exertion  Physical Exam  ZOX:WRUEA, healthy, no distress, well nourished and well developed SKIN: skin color, texture,  turgor are normal HEAD: Normocephalic EYES: normal, PERRLA OROPHARYNX:no exudate, no erythema and lips, buccal mucosa, and tongue normal  NECK: supple, no adenopathy LYMPH:  no palpable lymphadenopathy, no hepatosplenomegaly BREAST:not examined LUNGS: clear to auscultation  HEART: metallic heart sounds. ABDOMEN:abdomen soft, non-tender, normal bowel sounds and no masses or organomegaly BACK: Back symmetric, no curvature. EXTREMITIES:no joint deformities, effusion, or inflammation, no edema, no skin discoloration, no clubbing  NEURO: alert & oriented x 3 with fluent speech, no focal motor/sensory deficits  Studies/Results: No results found.   ASSESSMENT AND PLAN: This is a very pleasant 61 years old white female who has a history of anemia, which is currently resolved. No drainage of her anemia most likely multifactorial including iron deficiency secondary to bleeding even in small amount secondary to her treatment with Coumadin. The patient will also have hemolytic component for her anemia secondary to the metallic heart valve addition to anemia of chronic disease. I ordered several studies today to identify the etiology of her anemia. The studies include a repeat CBC, comprehensive metabolic panel, LDH, iron study and ferritin, serum vitamin B12 level, RBC folate, erythropoietin and serum protein electrophoreses. The patient is currently doing fine on her current treatment with iron tablet and vitamin B12 monthly injection. I recommended for her to continue with the current treatment and to followup with her primary care physician as previously scheduled. Since her anemia is corrected, I don't see a need for The patient to continue routine followup visit with me at this point. If there is any abnormalities on the lab work, I will contact the patient with my recommendation. The patient agreed to the current plan. I gave the patient the time to ask questions and I answered them completely to her  satisfactions.  All questions were answered. The patient knows to call the clinic with any problems, questions or concerns. We can certainly see the patient much sooner if necessary.  Thank you so much for allowing me to participate in the care of MAR ZETTLER. I will continue to follow up the patient with you and assist in her care.  I spent 25 minutes counseling the patient face to face. The total time spent in the appointment was .   Jumaane Weatherford K. 06/16/2011, 10:14 PM

## 2011-06-18 LAB — COMPREHENSIVE METABOLIC PANEL
ALT: 22 U/L (ref 0–35)
AST: 25 U/L (ref 0–37)
Albumin: 4.5 g/dL (ref 3.5–5.2)
Alkaline Phosphatase: 42 U/L (ref 39–117)
BUN: 20 mg/dL (ref 6–23)
CO2: 22 mEq/L (ref 19–32)
Calcium: 9.5 mg/dL (ref 8.4–10.5)
Chloride: 107 mEq/L (ref 96–112)
Creatinine, Ser: 1.16 mg/dL — ABNORMAL HIGH (ref 0.50–1.10)
Glucose, Bld: 115 mg/dL — ABNORMAL HIGH (ref 70–99)
Potassium: 3.8 mEq/L (ref 3.5–5.3)
Sodium: 140 mEq/L (ref 135–145)
Total Bilirubin: 0.3 mg/dL (ref 0.3–1.2)
Total Protein: 6.8 g/dL (ref 6.0–8.3)

## 2011-06-18 LAB — SPEP & IFE WITH QIG
Albumin ELP: 62.1 % (ref 55.8–66.1)
Alpha-1-Globulin: 4.4 % (ref 2.9–4.9)
Alpha-2-Globulin: 7.7 % (ref 7.1–11.8)
Beta 2: 6 % (ref 3.2–6.5)
Beta Globulin: 6.4 % (ref 4.7–7.2)
Gamma Globulin: 13.4 % (ref 11.1–18.8)
IgA: 303 mg/dL (ref 69–380)
IgG (Immunoglobin G), Serum: 1110 mg/dL (ref 690–1700)
IgM, Serum: 43 mg/dL — ABNORMAL LOW (ref 52–322)
Total Protein, Serum Electrophoresis: 6.8 g/dL (ref 6.0–8.3)

## 2011-06-18 LAB — IRON AND TIBC
%SAT: 22 % (ref 20–55)
Iron: 76 ug/dL (ref 42–145)
TIBC: 350 ug/dL (ref 250–470)
UIBC: 274 ug/dL (ref 125–400)

## 2011-06-18 LAB — FOLATE RBC: RBC Folate: 956 ng/mL (ref >=366)

## 2011-06-18 LAB — FERRITIN: Ferritin: 178 ng/mL (ref 10–291)

## 2011-06-18 LAB — ERYTHROPOIETIN: Erythropoietin: 8.3 m[IU]/mL (ref 2.6–34.0)

## 2011-06-18 LAB — LACTATE DEHYDROGENASE: LDH: 265 U/L — ABNORMAL HIGH (ref 94–250)

## 2011-06-18 LAB — VITAMIN B12: Vitamin B-12: 506 pg/mL (ref 211–911)

## 2011-06-18 LAB — TRANSFERRIN RECEPTOR, SOLUABLE: Transferrin Receptor, Soluble: 1.12 mg/L (ref 0.76–1.76)

## 2011-06-19 ENCOUNTER — Encounter: Payer: Self-pay | Admitting: Family Medicine

## 2011-06-19 ENCOUNTER — Ambulatory Visit (INDEPENDENT_AMBULATORY_CARE_PROVIDER_SITE_OTHER): Payer: PRIVATE HEALTH INSURANCE | Admitting: Family Medicine

## 2011-06-19 VITALS — BP 121/66 | HR 72 | Temp 97.8°F | Ht 67.25 in | Wt 230.1 lb

## 2011-06-19 DIAGNOSIS — D510 Vitamin B12 deficiency anemia due to intrinsic factor deficiency: Secondary | ICD-10-CM

## 2011-06-19 DIAGNOSIS — E538 Deficiency of other specified B group vitamins: Secondary | ICD-10-CM

## 2011-06-19 DIAGNOSIS — J209 Acute bronchitis, unspecified: Secondary | ICD-10-CM

## 2011-06-19 MED ORDER — CYANOCOBALAMIN 1000 MCG/ML IJ SOLN
1000.0000 ug | Freq: Once | INTRAMUSCULAR | Status: AC
  Administered 2011-06-19: 1000 ug via INTRAMUSCULAR

## 2011-06-19 NOTE — Patient Instructions (Signed)
Pernicious Anemia Pernicious anemia may be an immune system illness. It causes the production of antibodies to cells of the stomach (parietal cells), and proteins produced by the stomach, which are needed to absorb vitamin B12. The result of this illness is the body does not absorb enough B12 from the diet. This leads to lessened red blood cell production which causes anemia. Vitamin B12 is needed for making red blood cells and keeping the nervous system healthy. Not enough vitamin B12 in your system slowly affects sensory and motor nerves. This causes problems with your nervous system (neurological) to develop over time. Neurological effects of vitamin B12 deficiency may be seen before anemia is diagnosed. This affects both men and women, between ages 40 and 70. The anemia also affects the bowel, the heart and vascular systems and cannot be prevented. CAUSES  Pernicious anemia is due to a lack of substance called intrinsic factor. This is a substance made by cells in the stomach. It makes it possible to absorb vitamin B12. The reason for the lack of this substance is unknown but it may be autoimmune, genetic, or both. SYMPTOMS  The following problems may be seen with this illness:  Problems develop slowly.   Rapid heart rate.   Nausea, appetite loss, and weight loss.   Difficulty maintaining proper balance.   Yellow eyes and skin.   Loss of deep tendon reflexes.   Depression.   Confusion, poor memory, and dementia.   Ringing in the ears (tinnitus).   Weakness, especially in the arms and legs.   Sore tongue.   Numbness or tingling in the hands and feet.   Pale lips, tongue, and gums.   Bleeding gums.   Shortness of breath.   Headache.   Fatigue.  RISK OF VITAMIN B12 DEFICIENCY INCREASES WITH:  Diseases or surgery affecting the stomach.   Diabetes and autoimmune disorders. Autoimmune disorders are diseases where the body makes antibodies which attack your own body tissues.     Thyroid disorders.   Genetic factors, such as in people of Northern European ancestry. It is rare in African Americans and Asians.   Family history of pernicious anemia.   Age over 40.   Strict vegetarian diet or infants breast-fed by a mother on a strict vegetarian diet.   Lack of stomach acid in older adults.   Parasitic infections and intestinal diseases.   Drugs such as H2 blockers, proton pump inhibitors, colchicine, neomycin, and aminosalicylic acid.   Alcoholism.  PREVENTION  Pernicious anemia cannot be prevented but vitamin B12 deficiencies can be prevented.   For pernicious anemia, lifelong vitamin B12 therapy will help symptoms and prevent complications.   Dietary changes can prevent deficiency. B12 is mostly from animal sources so a deficiency is more likely in a vegetarian who does not eat eggs or dairy products.  RELATED COMPLICATIONS  Heart failure.   Nerve damage that can not be reversed.   Gastric cancer.  DIAGNOSIS  Your caregiver can determine what is wrong by:  Doing blood tests for vitamin B12 levels.   Checking for antibodies to the intrinsic factor.   Measuring the body's ability to absorb vitamin B12.  TREATMENT   Life long treatment usually involves vitamin B12 replacement. Monthly vitamin B12 injections are the treatment of choice to correct vitamin B12 deficiency and may be given by the patient. This therapy corrects the anemia and it may correct the neurological complications if given early enough. About 1% of vitamin B12 is absorbed (even in   the absence of intrinsic factor) so some caregivers recommend that elderly patients with gastric atrophy take oral vitamin B12 supplements in addition to monthly injections.   Some symptoms should start to clear up in a few days after treatment begins but other symptoms may take several months.   Additionally, other conditions which may lead to a deficiency should be treated.   Stop drinking alcohol  if alcoholism led to the vitamin B12 deficiency.   For other patients, the vitamin may be taken by mouth or as a nasal gel (or in addition to injections).   Iron supplements may be prescribed.   Avoid taking high amounts of folic acid. It can mask the signs of vitamin B12 deficiency.   Activity may be limited until symptoms improve.   Eat a well-balanced diet.   People on strict vegetarian diets can change their diet or take vitamin B12 supplements for life.  PROGNOSIS   When caught early, the prognosis is good. Most people will do well.   Patients with this illness have a higher incidence of cancer and polyps of the stomach.   Nervous system problems may not improve if treatment does not start soon enough.  Document Released: 08/02/2003 Document Revised: 01/22/2011 Document Reviewed: 05/09/2008 [employer] Patient Information 2012 [employer]. 

## 2011-06-22 NOTE — Assessment & Plan Note (Signed)
Has been receiving monthly vitamin B12 injections and she feels better, she has seen hematology and no further treatment has been initiated.

## 2011-06-22 NOTE — Assessment & Plan Note (Signed)
Improved, has had a good response to treatment and her symptoms and fatigue are improved.

## 2011-06-22 NOTE — Progress Notes (Signed)
Patient ID: [name], female   DOB: 05/14/1951, 61 y.o.   MRN: [id number] [name] [id number] Apr 03, 1951 06/22/2011      Progress Note-Follow Up  Subjective  Chief Complaint  Chief Complaint  Patient presents with  . Follow-up    2 month follow up  . Injections    B12    HPI  Patient is a 61 year old Caucasian female in today for follow up. She has been feeling much better. No fevers, chills, sob, cp, palp, congestion, gi or gu c/o. She has no recent illness. She has seen hematology and no new therapy has been started. She is feeling well today  Past Medical History  Diagnosis Date  . Anemia   . Anxiety   . Depression   . Diverticulosis   . Elevated cholesterol   . Obesity   . THYROMEGALY 07/22/2010  . Overweight 03/11/2010  . Mixed hyperlipidemia 03/11/2010  . MEASLES, HX OF 03/11/2010  . KNEE PAIN, RIGHT 05/28/2010  . HYPERTRIGLYCERIDEMIA 03/11/2010  . HEART MURMUR, HX OF 03/11/2010  . FIBROIDS, UTERUS 03/11/2010  . Diarrhea 08/05/2010  . CONSTIPATION 03/11/2010  . CHICKENPOX, HX OF 03/11/2010  . BIPOLAR AFFECTIVE DISORDER 03/11/2010  . AORTIC VALVE REPLACEMENT, HX OF 03/11/2010  . ANEMIA 05/28/2010  . Acute bronchitis 07/22/2010  . Abdominal pain, generalized 03/19/2010  . Peripheral edema 11/18/2010  . Bacterial vaginitis 11/18/2010  . Hematuria 12/18/2010  . Diverticulitis 12/18/2010  . Hyperthyroidism 12/18/2010  . Fatigue 12/30/2010  . Renal insufficiency 12/30/2010  . Grave's disease 8-12  . Arm lesion 03/23/2011    Past Surgical History  Procedure Date  . Appendectomy     Required revision for EColi infection, required recurrent packing  . Bowel obstruction     Requiring adhesions to be removed  . Abdominal hysterectomy     sb/l spo, total  . Aortic valve replacement   . Open heart with a cardiac aneurysm repair   . Cholecystectomy   . Tubal ligation   . Varicose vein surgery b/l legs   . Childhood exploratory surgery of heart   . Hernia repair  08-16-10    Family History  Problem Relation Age of Onset  . Scoliosis Mother   . Arthritis Mother     Rheumatoid  . Aneurysm Mother     heart  . Alcohol abuse Father   . Depression Sister   . Depression Brother   . Alcohol abuse Brother   . Cancer Maternal Grandmother     colon  . Diabetes Maternal Grandfather   . Heart disease Maternal Grandfather   . Alcohol abuse Maternal Grandfather   . Depression Paternal Grandmother   . Diabetes Paternal Grandmother   . Depression Paternal Grandfather   . Diabetes Paternal Grandfather   . Depression Sister   . Alcohol abuse Brother   . Depression Brother   . Heart disease Brother     History   Social History  . Marital Status: Married    Spouse Name: N/A    Number of Children: N/A  . Years of Education: N/A   Occupational History  . Not on file.   Social History Main Topics  . Smoking status: Former Smoker    Quit date: 05/26/1990  . Smokeless tobacco: Never Used  . Alcohol Use: 0.0 oz/week    0 drink(s) per week     very serious occasions  . Drug Use: No  . Sexually Active: No   Other Topics Concern  .  Not on file   Social History Narrative  . No narrative on file    Current Outpatient Prescriptions on File Prior to Visit  Medication Sig Dispense Refill  . acetaminophen (TYLENOL) 325 MG tablet Take 650 mg by mouth every 6 (six) hours as needed.        Marland Kitchen aspirin 81 MG tablet Take 81 mg by mouth daily.        . clonazePAM (KLONOPIN) 0.5 MG tablet Take 1 tablet (0.5 mg total) by mouth 2 (two) times daily. 1/2 tab  30 tablet  1  . cyanocobalamin 1000 MCG tablet Inject 1,000 mcg as directed every 30 (thirty) days.      Marland Kitchen docusate sodium (COLACE) 100 MG capsule Take 1 capsule (100 mg total) by mouth daily as needed for constipation.  30 capsule  2  . DULoxetine (CYMBALTA) 30 MG capsule Take 30 mg by mouth daily.        . fenofibrate 160 MG tablet Take 160 mg by mouth daily.        . Ferrous Fumarate-Folic Acid  (HEMOCYTE-F) 324-1 MG TABS Take 1 tablet by mouth daily.  30 each  3  . furosemide (LASIX) 20 MG tablet Take 1 tablet (20 mg total) by mouth daily as needed. allergies  30 tablet  2  . lamoTRIgine (LAMICTAL) 150 MG tablet Take 150 mg by mouth daily. 1/2 tab       . methimazole (TAPAZOLE) 10 MG tablet Take 1 tablet (10 mg total) by mouth daily.  30 tablet  3  . montelukast (SINGULAIR) 10 MG tablet TAKE 1 TABLET EVERY DAY  30 tablet  1  . oxyCODONE-acetaminophen (ROXICET) 5-325 MG per tablet 1 tab po bid prn pain  60 tablet  0  . pantoprazole (PROTONIX) 40 MG tablet Take 1 tablet (40 mg total) by mouth daily.  30 tablet  1  . pravastatin (PRAVACHOL) 40 MG tablet Take 1 tablet (40 mg total) by mouth at bedtime.  30 tablet  1  . Probiotic Product (ALIGN) 4 MG CAPS Take 1 capsule by mouth daily.  30 capsule  0  . QUEtiapine (SEROQUEL) 200 MG tablet Take 200 mg by mouth at bedtime. 1-2 tab       . warfarin (COUMADIN) 5 MG tablet Take 5 mg by mouth daily. 1/2 to 1 tab         Allergies  Allergen Reactions  . Sulfonamide Derivatives     Review of Systems  Review of Systems  Constitutional: Negative for fever and malaise/fatigue.  HENT: Negative for congestion.   Eyes: Negative for discharge.  Respiratory: Negative for shortness of breath.   Cardiovascular: Negative for chest pain, palpitations and leg swelling.  Gastrointestinal: Negative for nausea, abdominal pain and diarrhea.  Genitourinary: Negative for dysuria.  Musculoskeletal: Negative for falls.  Skin: Negative for rash.  Neurological: Negative for loss of consciousness and headaches.  Endo/Heme/Allergies: Negative for polydipsia.  Psychiatric/Behavioral: Negative for depression and suicidal ideas. The patient is not nervous/anxious and does not have insomnia.     Objective  BP 121/66  Pulse 72  Temp(Src) 97.8 F (36.6 C) (Temporal)  Ht 5' 7.25" (1.708 m)  Wt 230 lb 1.9 oz (104.382 kg)  BMI 35.77 kg/m2  SpO2  97%  Physical Exam  Physical Exam  Constitutional: She is oriented to person, place, and time and well-developed, well-nourished, and in no distress. No distress.  HENT:  Head: Normocephalic and atraumatic.  Eyes: Conjunctivae are normal.  Neck:  Neck supple. No thyromegaly present.  Cardiovascular: Normal rate, regular rhythm and normal heart sounds.   Pulmonary/Chest: Effort normal and breath sounds normal. She has no wheezes.  Abdominal: She exhibits no distension and no mass.  Musculoskeletal: She exhibits no edema.  Lymphadenopathy:    She has no cervical adenopathy.  Neurological: She is alert and oriented to person, place, and time.  Skin: Skin is warm and dry. No rash noted. She is not diaphoretic.  Psychiatric: Memory, affect and judgment normal.    Lab Results  Component Value Date   TSH 1.65 05/08/2011   Lab Results  Component Value Date   WBC 4.4 06/16/2011   HGB 12.0 06/16/2011   HCT 35.6 06/16/2011   MCV 86.6 06/16/2011   PLT 233 06/16/2011   Lab Results  Component Value Date   CREATININE 1.16* 06/16/2011   BUN 20 06/16/2011   NA 140 06/16/2011   K 3.8 06/16/2011   CL 107 06/16/2011   CO2 22 06/16/2011   Lab Results  Component Value Date   ALT 22 06/16/2011   AST 25 06/16/2011   ALKPHOS 42 06/16/2011   BILITOT 0.3 06/16/2011   Lab Results  Component Value Date   CHOL 169 12/16/2010   Lab Results  Component Value Date   HDL 43.40 12/16/2010   No results found for this basename: LDLCALC   Lab Results  Component Value Date   TRIG 244.0* 12/16/2010   Lab Results  Component Value Date   CHOLHDL 4 12/16/2010     Assessment & Plan  Pernicious anemia Has been receiving monthly vitamin B12 injections and she feels better, she has seen hematology and no further treatment has been initiated.   ACUTE BRONCHITIS Improved, has had a good response to treatment and her symptoms and fatigue are improved.

## 2011-06-24 ENCOUNTER — Encounter: Payer: Self-pay | Admitting: Endocrinology

## 2011-06-24 ENCOUNTER — Ambulatory Visit (INDEPENDENT_AMBULATORY_CARE_PROVIDER_SITE_OTHER): Payer: PRIVATE HEALTH INSURANCE | Admitting: Endocrinology

## 2011-06-24 DIAGNOSIS — E0590 Thyrotoxicosis, unspecified without thyrotoxic crisis or storm: Secondary | ICD-10-CM

## 2011-06-24 MED ORDER — METHIMAZOLE 5 MG PO TABS: 5.0000 mg | ORAL_TABLET | Freq: Three times a day (TID) | ORAL | Status: DC

## 2011-06-24 NOTE — Patient Instructions (Addendum)
Skip methimazole x 5 days, then resume at 5 mg daily. Please come back for a follow-up appointment in 6 weeks.   if ever you have fever while taking methimazole, stop it and call us, because of the risk of a rare side-effect.

## 2011-06-24 NOTE — Progress Notes (Signed)
Subjective:    Patient ID: [name], female    DOB: January 26, 1951, 61 y.o.   MRN: [id number]  HPI Pt returns for f/u of hyperthyroidism, in the setting of recent amiodarone rx (which she had to d/c, due to renal insuff).  She takes tapazole 10 mg daily, as rx'ed.   Past Medical History  Diagnosis Date  . Anemia   . Anxiety   . Depression   . Diverticulosis   . Elevated cholesterol   . Obesity   . THYROMEGALY 07/22/2010  . Overweight 03/11/2010  . Mixed hyperlipidemia 03/11/2010  . MEASLES, HX OF 03/11/2010  . KNEE PAIN, RIGHT 05/28/2010  . HYPERTRIGLYCERIDEMIA 03/11/2010  . HEART MURMUR, HX OF 03/11/2010  . FIBROIDS, UTERUS 03/11/2010  . Diarrhea 08/05/2010  . CONSTIPATION 03/11/2010  . CHICKENPOX, HX OF 03/11/2010  . BIPOLAR AFFECTIVE DISORDER 03/11/2010  . AORTIC VALVE REPLACEMENT, HX OF 03/11/2010  . ANEMIA 05/28/2010  . Acute bronchitis 07/22/2010  . Abdominal pain, generalized 03/19/2010  . Peripheral edema 11/18/2010  . Bacterial vaginitis 11/18/2010  . Hematuria 12/18/2010  . Diverticulitis 12/18/2010  . Hyperthyroidism 12/18/2010  . Fatigue 12/30/2010  . Renal insufficiency 12/30/2010  . Grave's disease 8-12  . Arm lesion 03/23/2011    Past Surgical History  Procedure Date  . Appendectomy     Required revision for EColi infection, required recurrent packing  . Bowel obstruction     Requiring adhesions to be removed  . Abdominal hysterectomy     sb/l spo, total  . Aortic valve replacement   . Open heart with a cardiac aneurysm repair   . Cholecystectomy   . Tubal ligation   . Varicose vein surgery b/l legs   . Childhood exploratory surgery of heart   . Hernia repair 08-16-10    History   Social History  . Marital Status: Married    Spouse Name: N/A    Number of Children: N/A  . Years of Education: N/A   Occupational History  . Not on file.   Social History Main Topics  . Smoking status: Former Smoker    Quit date: 05/26/1990  . Smokeless tobacco: Never  Used  . Alcohol Use: 0.0 oz/week    0 drink(s) per week     very serious occasions  . Drug Use: No  . Sexually Active: No   Other Topics Concern  . Not on file   Social History Narrative  . No narrative on file    Current Outpatient Prescriptions on File Prior to Visit  Medication Sig Dispense Refill  . acetaminophen (TYLENOL) 325 MG tablet Take 650 mg by mouth every 6 (six) hours as needed.        Marland Kitchen aspirin 81 MG tablet Take 81 mg by mouth daily.        . clonazePAM (KLONOPIN) 0.5 MG tablet Take 1 tablet (0.5 mg total) by mouth 2 (two) times daily. 1/2 tab  30 tablet  1  . cyanocobalamin 1000 MCG tablet Inject 1,000 mcg as directed every 30 (thirty) days.      Marland Kitchen docusate sodium (COLACE) 100 MG capsule Take 1 capsule (100 mg total) by mouth daily as needed for constipation.  30 capsule  2  . DULoxetine (CYMBALTA) 30 MG capsule Take 30 mg by mouth daily.        . fenofibrate 160 MG tablet Take 160 mg by mouth daily.        . Ferrous Fumarate-Folic Acid (HEMOCYTE-F) 324-1 MG TABS Take 1  tablet by mouth daily.  30 each  3  . furosemide (LASIX) 20 MG tablet Take 1 tablet (20 mg total) by mouth daily as needed. allergies  30 tablet  2  . lamoTRIgine (LAMICTAL) 150 MG tablet Take 150 mg by mouth daily. 1/2 tab       . methimazole (TAPAZOLE) 10 MG tablet Take 1 tablet (10 mg total) by mouth daily.  30 tablet  3  . montelukast (SINGULAIR) 10 MG tablet TAKE 1 TABLET EVERY DAY  30 tablet  1  . oxyCODONE-acetaminophen (ROXICET) 5-325 MG per tablet 1 tab po bid prn pain  60 tablet  0  . pantoprazole (PROTONIX) 40 MG tablet Take 1 tablet (40 mg total) by mouth daily.  30 tablet  1  . pravastatin (PRAVACHOL) 40 MG tablet Take 1 tablet (40 mg total) by mouth at bedtime.  30 tablet  1  . Probiotic Product (ALIGN) 4 MG CAPS Take 1 capsule by mouth daily.  30 capsule  0  . QUEtiapine (SEROQUEL) 200 MG tablet Take 200 mg by mouth at bedtime. 1-2 tab       . warfarin (COUMADIN) 5 MG tablet Take 5 mg by  mouth daily. 1/2 to 1 tab         Allergies  Allergen Reactions  . Sulfonamide Derivatives     Family History  Problem Relation Age of Onset  . Scoliosis Mother   . Arthritis Mother     Rheumatoid  . Aneurysm Mother     heart  . Alcohol abuse Father   . Depression Sister   . Depression Brother   . Alcohol abuse Brother   . Cancer Maternal Grandmother     colon  . Diabetes Maternal Grandfather   . Heart disease Maternal Grandfather   . Alcohol abuse Maternal Grandfather   . Depression Paternal Grandmother   . Diabetes Paternal Grandmother   . Depression Paternal Grandfather   . Diabetes Paternal Grandfather   . Depression Sister   . Alcohol abuse Brother   . Depression Brother   . Heart disease Brother    BP 134/74  Pulse 80  Temp(Src) 97.7 F (36.5 C) (Oral)  Ht 5\' 6"  (1.676 m)  Wt 231 lb 6.4 oz (104.962 kg)  BMI 37.35 kg/m2  SpO2 97%  Review of Systems Denies fever    Objective:   Physical Exam VITAL SIGNS:  See vs page GENERAL: no distress. Neck: thyroid is slightly and diffusely enlarged   outside test results are reviewed: TSH=7    Assessment & Plan:  Hyperthyroidism, still slightly overcontrolled

## 2011-07-22 ENCOUNTER — Ambulatory Visit (INDEPENDENT_AMBULATORY_CARE_PROVIDER_SITE_OTHER): Payer: PRIVATE HEALTH INSURANCE

## 2011-07-22 DIAGNOSIS — E538 Deficiency of other specified B group vitamins: Secondary | ICD-10-CM

## 2011-07-22 MED ORDER — CYANOCOBALAMIN 1000 MCG/ML IJ SOLN
1000.0000 ug | Freq: Once | INTRAMUSCULAR | Status: AC
  Administered 2011-07-22: 1000 ug via INTRAMUSCULAR

## 2011-07-31 ENCOUNTER — Encounter: Payer: Self-pay | Admitting: Family Medicine

## 2011-07-31 ENCOUNTER — Ambulatory Visit (INDEPENDENT_AMBULATORY_CARE_PROVIDER_SITE_OTHER): Payer: PRIVATE HEALTH INSURANCE | Admitting: Family Medicine

## 2011-07-31 VITALS — BP 116/68 | HR 74 | Temp 97.4°F | Ht 66.0 in | Wt 233.8 lb

## 2011-07-31 DIAGNOSIS — J40 Bronchitis, not specified as acute or chronic: Secondary | ICD-10-CM

## 2011-07-31 DIAGNOSIS — J209 Acute bronchitis, unspecified: Secondary | ICD-10-CM

## 2011-07-31 MED ORDER — HYDROCOD POLST-CHLORPHEN POLST 10-8 MG/5ML PO LQCR: 5.0000 mL | Freq: Two times a day (BID) | ORAL | Status: DC | PRN

## 2011-07-31 MED ORDER — AMOXICILLIN-POT CLAVULANATE 875-125 MG PO TABS: 1.0000 | ORAL_TABLET | Freq: Two times a day (BID) | ORAL | Status: AC

## 2011-07-31 MED ORDER — PREDNISONE 20 MG PO TABS: 20.0000 mg | ORAL_TABLET | Freq: Two times a day (BID) | ORAL | Status: AC

## 2011-07-31 MED ORDER — ALBUTEROL SULFATE HFA 108 (90 BASE) MCG/ACT IN AERS: 2.0000 | INHALATION_SPRAY | Freq: Four times a day (QID) | RESPIRATORY_TRACT | Status: DC | PRN

## 2011-07-31 NOTE — Patient Instructions (Signed)

## 2011-07-31 NOTE — Assessment & Plan Note (Signed)
Diffuse wheezing noted today. Started on Prednisone, Augmentin, Tussionex and Albuterol increase rest and fluids and consider Mucinex bid, report if symptoms do not improve

## 2011-07-31 NOTE — Progress Notes (Signed)
Patient ID: [name], female   DOB: 1950-10-16, 61 y.o.   MRN: [id number] GLYNIS HUNSUCKER [id number] Apr 23, 1951 07/31/2011      Progress Note-Follow Up  Subjective  Chief Complaint  Chief Complaint  Patient presents with  . Sinusitis    wheezing, X last week    HPI  Patient is in today with a one-week history of worsening respiratory symptoms. She started with sinus pressure and chest congestion at the same time. She started with some pressure and discomfort in her left ear intermittently. She notes postnasal drip is occasionally productive of green phlegm her nose is also occasionally productive green phlegm she is coughing to the point of keeping herself and her husband awake at night and coughs significantly during the day as well. She denies fevers, chills, shortness of breath, chest pain, palpitations or any gastrointestinal symptoms such as diarrhea or vomiting. She has not had any over-the-counter medications thus far.  Past Medical History  Diagnosis Date  . Anemia   . Anxiety   . Depression   . Diverticulosis   . Elevated cholesterol   . Obesity   . THYROMEGALY 07/22/2010  . Overweight 03/11/2010  . Mixed hyperlipidemia 03/11/2010  . MEASLES, HX OF 03/11/2010  . KNEE PAIN, RIGHT 05/28/2010  . HYPERTRIGLYCERIDEMIA 03/11/2010  . HEART MURMUR, HX OF 03/11/2010  . FIBROIDS, UTERUS 03/11/2010  . Diarrhea 08/05/2010  . CONSTIPATION 03/11/2010  . CHICKENPOX, HX OF 03/11/2010  . BIPOLAR AFFECTIVE DISORDER 03/11/2010  . AORTIC VALVE REPLACEMENT, HX OF 03/11/2010  . ANEMIA 05/28/2010  . Acute bronchitis 07/22/2010  . Abdominal pain, generalized 03/19/2010  . Peripheral edema 11/18/2010  . Bacterial vaginitis 11/18/2010  . Hematuria 12/18/2010  . Diverticulitis 12/18/2010  . Hyperthyroidism 12/18/2010  . Fatigue 12/30/2010  . Renal insufficiency 12/30/2010  . Grave's disease 8-12  . Arm lesion 03/23/2011    Past Surgical History  Procedure Date  . Appendectomy     Required  revision for EColi infection, required recurrent packing  . Bowel obstruction     Requiring adhesions to be removed  . Abdominal hysterectomy     sb/l spo, total  . Aortic valve replacement   . Open heart with a cardiac aneurysm repair   . Cholecystectomy   . Tubal ligation   . Varicose vein surgery b/l legs   . Childhood exploratory surgery of heart   . Hernia repair 08-16-10    Family History  Problem Relation Age of Onset  . Scoliosis Mother   . Arthritis Mother     Rheumatoid  . Aneurysm Mother     heart  . Alcohol abuse Father   . Depression Sister   . Depression Brother   . Alcohol abuse Brother   . Cancer Maternal Grandmother     colon  . Diabetes Maternal Grandfather   . Heart disease Maternal Grandfather   . Alcohol abuse Maternal Grandfather   . Depression Paternal Grandmother   . Diabetes Paternal Grandmother   . Depression Paternal Grandfather   . Diabetes Paternal Grandfather   . Depression Sister   . Alcohol abuse Brother   . Depression Brother   . Heart disease Brother     History   Social History  . Marital Status: Married    Spouse Name: N/A    Number of Children: N/A  . Years of Education: N/A   Occupational History  . Not on file.   Social History Main Topics  . Smoking status: Former Smoker  Quit date: 05/26/1990  . Smokeless tobacco: Never Used  . Alcohol Use: 0.0 oz/week    0 drink(s) per week     very serious occasions  . Drug Use: No  . Sexually Active: No   Other Topics Concern  . Not on file   Social History Narrative  . No narrative on file    Current Outpatient Prescriptions on File Prior to Visit  Medication Sig Dispense Refill  . acetaminophen (TYLENOL) 325 MG tablet Take 650 mg by mouth every 6 (six) hours as needed.        Marland Kitchen aspirin 81 MG tablet Take 81 mg by mouth daily.        . clonazePAM (KLONOPIN) 0.5 MG tablet Take 1 tablet (0.5 mg total) by mouth 2 (two) times daily. 1/2 tab  30 tablet  1  .  cyanocobalamin 1000 MCG tablet Inject 1,000 mcg as directed every 30 (thirty) days.      Marland Kitchen docusate sodium (COLACE) 100 MG capsule Take 1 capsule (100 mg total) by mouth daily as needed for constipation.  30 capsule  2  . DULoxetine (CYMBALTA) 30 MG capsule Take 30 mg by mouth daily.        . fenofibrate 160 MG tablet Take 160 mg by mouth daily.        . Ferrous Fumarate-Folic Acid (HEMOCYTE-F) 324-1 MG TABS Take 1 tablet by mouth daily.  30 each  3  . furosemide (LASIX) 20 MG tablet Take 1 tablet (20 mg total) by mouth daily as needed. allergies  30 tablet  2  . lamoTRIgine (LAMICTAL) 150 MG tablet Take 150 mg by mouth daily. 1/2 tab       . methimazole (TAPAZOLE) 5 MG tablet Take 1 tablet (5 mg total) by mouth 3 (three) times daily.  30 tablet  2  . montelukast (SINGULAIR) 10 MG tablet TAKE 1 TABLET EVERY DAY  30 tablet  1  . oxyCODONE-acetaminophen (ROXICET) 5-325 MG per tablet 1 tab po bid prn pain  60 tablet  0  . pantoprazole (PROTONIX) 40 MG tablet Take 1 tablet (40 mg total) by mouth daily.  30 tablet  1  . pravastatin (PRAVACHOL) 40 MG tablet Take 1 tablet (40 mg total) by mouth at bedtime.  30 tablet  1  . Probiotic Product (ALIGN) 4 MG CAPS Take 1 capsule by mouth daily.  30 capsule  0  . QUEtiapine (SEROQUEL) 200 MG tablet Take 200 mg by mouth at bedtime. 1-2 tab       . warfarin (COUMADIN) 5 MG tablet Take 5 mg by mouth daily. 1/2 to 1 tab         Allergies  Allergen Reactions  . Sulfonamide Derivatives     Review of Systems  Review of Systems  Constitutional: Positive for malaise/fatigue. Negative for fever and chills.  HENT: Positive for ear pain, congestion and sore throat. Negative for hearing loss and ear discharge.   Eyes: Negative for discharge.  Respiratory: Positive for cough, sputum production and wheezing. Negative for shortness of breath.   Cardiovascular: Negative for chest pain, palpitations and leg swelling.  Gastrointestinal: Positive for heartburn. Negative  for nausea, vomiting, abdominal pain, diarrhea and constipation.  Genitourinary: Negative for dysuria.  Musculoskeletal: Negative for falls.  Skin: Negative for rash.  Neurological: Negative for loss of consciousness and headaches.  Endo/Heme/Allergies: Negative for polydipsia.  Psychiatric/Behavioral: Negative for depression and suicidal ideas. The patient is not nervous/anxious and does not have insomnia.  Objective  BP 116/68  Pulse 74  Temp(Src) 97.4 F (36.3 C) (Temporal)  Ht 5\' 6"  (1.676 m)  Wt 233 lb 12.8 oz (106.051 kg)  BMI 37.74 kg/m2  SpO2 94%  Physical Exam  Physical Exam  Constitutional: She is oriented to person, place, and time and well-developed, well-nourished, and in no distress. No distress.  HENT:  Head: Normocephalic and atraumatic.  Eyes: Conjunctivae are normal.  Neck: Neck supple. No thyromegaly present.  Cardiovascular: Normal rate, regular rhythm and normal heart sounds.   No murmur heard. Pulmonary/Chest: Effort normal. No respiratory distress. She has wheezes. She has no rales.  Abdominal: She exhibits no distension and no mass.  Musculoskeletal: She exhibits no edema.  Lymphadenopathy:    She has no cervical adenopathy.  Neurological: She is alert and oriented to person, place, and time.  Skin: Skin is warm and dry. No rash noted. She is not diaphoretic.  Psychiatric: Memory, affect and judgment normal.    Lab Results  Component Value Date   TSH 1.65 05/08/2011   Lab Results  Component Value Date   WBC 4.4 06/16/2011   HGB 12.0 06/16/2011   HCT 35.6 06/16/2011   MCV 86.6 06/16/2011   PLT 233 06/16/2011   Lab Results  Component Value Date   CREATININE 1.16* 06/16/2011   BUN 20 06/16/2011   NA 140 06/16/2011   K 3.8 06/16/2011   CL 107 06/16/2011   CO2 22 06/16/2011   Lab Results  Component Value Date   ALT 22 06/16/2011   AST 25 06/16/2011   ALKPHOS 42 06/16/2011   BILITOT 0.3 06/16/2011   Lab Results  Component Value Date   CHOL  169 12/16/2010   Lab Results  Component Value Date   HDL 43.40 12/16/2010   No results found for this basename: [facility]   Lab Results  Component Value Date   TRIG 244.0* 12/16/2010   Lab Results  Component Value Date   CHOLHDL 4 12/16/2010     Assessment & Plan  ACUTE BRONCHITIS Diffuse wheezing noted today. Started on Prednisone, Augmentin, Tussionex and Albuterol increase rest and fluids and consider Mucinex bid, report if symptoms do not improve

## 2011-08-05 ENCOUNTER — Emergency Department (HOSPITAL_BASED_OUTPATIENT_CLINIC_OR_DEPARTMENT_OTHER)
Admission: EM | Admit: 2011-08-05 | Discharge: 2011-08-05 | Disposition: A | Discharge status: Home Health | Payer: No Typology Code available for payment source | Attending: Emergency Medicine | Admitting: Emergency Medicine

## 2011-08-05 ENCOUNTER — Emergency Department (INDEPENDENT_AMBULATORY_CARE_PROVIDER_SITE_OTHER): Payer: No Typology Code available for payment source

## 2011-08-05 ENCOUNTER — Other Ambulatory Visit (INDEPENDENT_AMBULATORY_CARE_PROVIDER_SITE_OTHER): Payer: PRIVATE HEALTH INSURANCE

## 2011-08-05 ENCOUNTER — Ambulatory Visit (INDEPENDENT_AMBULATORY_CARE_PROVIDER_SITE_OTHER): Payer: PRIVATE HEALTH INSURANCE | Admitting: Endocrinology

## 2011-08-05 ENCOUNTER — Encounter: Payer: Self-pay | Admitting: Endocrinology

## 2011-08-05 ENCOUNTER — Encounter (HOSPITAL_BASED_OUTPATIENT_CLINIC_OR_DEPARTMENT_OTHER): Payer: Self-pay | Admitting: *Deleted

## 2011-08-05 VITALS — BP 148/82 | HR 72 | Temp 98.4°F | Ht 66.0 in | Wt 233.4 lb

## 2011-08-05 DIAGNOSIS — E669 Obesity, unspecified: Secondary | ICD-10-CM | POA: Insufficient documentation

## 2011-08-05 DIAGNOSIS — E0590 Thyrotoxicosis, unspecified without thyrotoxic crisis or storm: Secondary | ICD-10-CM

## 2011-08-05 DIAGNOSIS — M25519 Pain in unspecified shoulder: Secondary | ICD-10-CM | POA: Insufficient documentation

## 2011-08-05 DIAGNOSIS — S40019A Contusion of unspecified shoulder, initial encounter: Secondary | ICD-10-CM

## 2011-08-05 DIAGNOSIS — M79609 Pain in unspecified limb: Secondary | ICD-10-CM

## 2011-08-05 DIAGNOSIS — R011 Cardiac murmur, unspecified: Secondary | ICD-10-CM | POA: Insufficient documentation

## 2011-08-05 DIAGNOSIS — R109 Unspecified abdominal pain: Secondary | ICD-10-CM

## 2011-08-05 DIAGNOSIS — IMO0002 Reserved for concepts with insufficient information to code with codable children: Secondary | ICD-10-CM

## 2011-08-05 DIAGNOSIS — G319 Degenerative disease of nervous system, unspecified: Secondary | ICD-10-CM

## 2011-08-05 DIAGNOSIS — R51 Headache: Secondary | ICD-10-CM

## 2011-08-05 DIAGNOSIS — E781 Pure hyperglyceridemia: Secondary | ICD-10-CM | POA: Insufficient documentation

## 2011-08-05 DIAGNOSIS — Z951 Presence of aortocoronary bypass graft: Secondary | ICD-10-CM | POA: Insufficient documentation

## 2011-08-05 DIAGNOSIS — M542 Cervicalgia: Secondary | ICD-10-CM

## 2011-08-05 DIAGNOSIS — T07XXXA Unspecified multiple injuries, initial encounter: Secondary | ICD-10-CM | POA: Insufficient documentation

## 2011-08-05 DIAGNOSIS — E0500 Thyrotoxicosis with diffuse goiter without thyrotoxic crisis or storm: Secondary | ICD-10-CM | POA: Insufficient documentation

## 2011-08-05 DIAGNOSIS — Z954 Presence of other heart-valve replacement: Secondary | ICD-10-CM | POA: Insufficient documentation

## 2011-08-05 DIAGNOSIS — F319 Bipolar disorder, unspecified: Secondary | ICD-10-CM | POA: Insufficient documentation

## 2011-08-05 LAB — TSH: TSH: 4.2 u[IU]/mL (ref 0.35–5.50)

## 2011-08-05 LAB — CBC
HCT: 34.4 % — ABNORMAL LOW (ref 36.0–46.0)
Hemoglobin: 11.6 g/dL — ABNORMAL LOW (ref 12.0–15.0)
MCH: 30 pg (ref 26.0–34.0)
MCHC: 33.7 g/dL (ref 30.0–36.0)
MCV: 88.9 fL (ref 78.0–100.0)
Platelets: 276 10*3/uL (ref 150–400)
RBC: 3.87 MIL/uL (ref 3.87–5.11)
RDW: 14 % (ref 11.5–15.5)
WBC: 10.7 10*3/uL — ABNORMAL HIGH (ref 4.0–10.5)

## 2011-08-05 LAB — DIFFERENTIAL
Basophils Absolute: 0.1 10*3/uL (ref 0.0–0.1)
Basophils Relative: 1 % (ref 0–1)
Eosinophils Absolute: 0.1 10*3/uL (ref 0.0–0.7)
Eosinophils Relative: 1 % (ref 0–5)
Lymphocytes Relative: 34 % (ref 12–46)
Lymphs Abs: 3.6 10*3/uL (ref 0.7–4.0)
Monocytes Absolute: 1 10*3/uL (ref 0.1–1.0)
Monocytes Relative: 9 % (ref 3–12)
Neutro Abs: 6 10*3/uL (ref 1.7–7.7)
Neutrophils Relative %: 56 % (ref 43–77)

## 2011-08-05 LAB — BASIC METABOLIC PANEL
BUN: 20 mg/dL (ref 6–23)
CO2: 27 mEq/L (ref 19–32)
Calcium: 9.1 mg/dL (ref 8.4–10.5)
Chloride: 104 mEq/L (ref 96–112)
Creatinine, Ser: 1.1 mg/dL (ref 0.50–1.10)
GFR calc Af Amer: 62 mL/min — ABNORMAL LOW (ref >90)
GFR calc non Af Amer: 53 mL/min — ABNORMAL LOW (ref >90)
Glucose, Bld: 79 mg/dL (ref 70–99)
Potassium: 3.8 mEq/L (ref 3.5–5.1)
Sodium: 140 mEq/L (ref 135–145)

## 2011-08-05 LAB — T4, FREE: Free T4: 0.65 ng/dL (ref 0.60–1.60)

## 2011-08-05 LAB — APTT: aPTT: 31 seconds (ref 24–37)

## 2011-08-05 LAB — PROTIME-INR
INR: 1.91 — ABNORMAL HIGH (ref 0.00–1.49)
Prothrombin Time: 22.2 seconds — ABNORMAL HIGH (ref 11.6–15.2)

## 2011-08-05 MED ORDER — IOHEXOL 300 MG/ML  SOLN
100.0000 mL | Freq: Once | INTRAMUSCULAR | Status: AC | PRN
  Administered 2011-08-05: 100 mL via INTRAVENOUS

## 2011-08-05 MED ORDER — SODIUM CHLORIDE 0.9 % IV SOLN: Freq: Once | INTRAVENOUS | Status: DC

## 2011-08-05 MED ORDER — HYDROCODONE-ACETAMINOPHEN 5-325 MG PO TABS: 2.0000 | ORAL_TABLET | ORAL | Status: AC | PRN
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
## 2011-08-05 NOTE — Patient Instructions (Addendum)
Stop methimazole blood tests are being requested for you today.  please call [phone] to hear your test results.  You will be prompted to enter the 9-digit "MRN" number that appears at the top left of this page, followed by #.  Then you will hear the message. When the thyroid goes overactive again: We would check a thyroid "scan" (a special, but easy and painless type of thyroid x ray).  It works like this: you go to the x-ray department of the hospital to swallow a pill, which contains a miniscule amount of radiation.  You will not notice any symptoms from this.  You will go back to the x-ray department the next day, to lie down in front of a camera.  The results of this will be sent to me.  please call [phone] to hear your test results.  You will be prompted to enter the 9-digit "MRN" number that appears at the top left of this page, followed by #.  Then you will hear the message. Based on the results, i hope to order for you a treatment pill of radioactive iodine.  Although it is a larger amount of radiation, you will again notice no symptoms from this.  The pill is gone from your body in a few days (during which you should stay away from other people), but takes several months to work.  Therefore, please return here approximately 6-8 weeks after the treatment.  This treatment has been available for many years, and the only known side-effect is an underactive thyroid.  It is possible that i would eventually prescribe for you a thyroid hormone pill, which is very inexpensive.  You don't have to worry about side-effects of this thyroid hormone pill, because it is the same molecule your thyroid makes. (update: i left message on phone-tree:  Stay-off tapazole.  Go back to lab in 2 weeks to recheck tft.  When tft are high, i'll order scan)
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
## 2011-08-05 NOTE — Progress Notes (Signed)
Subjective:    Patient ID: [name], female    DOB: [birth year], 61 y.o.   MRN: [id number]  HPI  Pt returns for f/u of hyperthyroidism, in the setting of recent amiodarone rx (which she had to d/c, due to renal insuff).  She takes tapazole 5 mg daily, as rx'ed.  Since she has reduce the tapazole to 50 mg qd, tremor has recurred. Past Medical History  Diagnosis Date  . Anemia   . Anxiety   . Depression   . Diverticulosis   . Elevated cholesterol   . Obesity   . THYROMEGALY 07/22/2010  . Overweight 03/11/2010  . Mixed hyperlipidemia 03/11/2010  . MEASLES, HX OF 03/11/2010  . KNEE PAIN, RIGHT 05/28/2010  . HYPERTRIGLYCERIDEMIA 03/11/2010  . HEART MURMUR, HX OF 03/11/2010  . FIBROIDS, UTERUS 03/11/2010  . Diarrhea 08/05/2010  . CONSTIPATION 03/11/2010  . CHICKENPOX, HX OF 03/11/2010  . BIPOLAR AFFECTIVE DISORDER 03/11/2010  . AORTIC VALVE REPLACEMENT, HX OF 03/11/2010  . ANEMIA 05/28/2010  . Acute bronchitis 07/22/2010  . Abdominal pain, generalized 03/19/2010  . Peripheral edema 11/18/2010  . Bacterial vaginitis 11/18/2010  . Hematuria 12/18/2010  . Diverticulitis 12/18/2010  . Hyperthyroidism 12/18/2010  . Fatigue 12/30/2010  . Renal insufficiency 12/30/2010  . Grave's disease 8-12  . Arm lesion 03/23/2011    Past Surgical History  Procedure Date  . Appendectomy     Required revision for EColi infection, required recurrent packing  . Bowel obstruction     Requiring adhesions to be removed  . Abdominal hysterectomy     sb/l spo, total  . Aortic valve replacement   . Open heart with a cardiac aneurysm repair   . Cholecystectomy   . Tubal ligation   . Varicose vein surgery b/l legs   . Childhood exploratory surgery of heart   . Hernia repair 08-16-10    History   Social History  . Marital Status: Married    Spouse Name: N/A    Number of Children: N/A  . Years of Education: N/A   Occupational History  . Not on file.   Social History Main Topics  . Smoking status:  Former Smoker    Quit date: 05/26/1990  . Smokeless tobacco: Never Used  . Alcohol Use: 0.0 oz/week    0 drink(s) per week     very serious occasions  . Drug Use: No  . Sexually Active: No   Other Topics Concern  . Not on file   Social History Narrative  . No narrative on file    Current Outpatient Prescriptions on File Prior to Visit  Medication Sig Dispense Refill  . acetaminophen (TYLENOL) 325 MG tablet Take 650 mg by mouth every 6 (six) hours as needed.        Marland Kitchen albuterol (PROVENTIL HFA;VENTOLIN HFA) 108 (90 BASE) MCG/ACT inhaler Inhale 2 puffs into the lungs every 6 (six) hours as needed for wheezing.  1 Inhaler  1  . amoxicillin-clavulanate (AUGMENTIN) 875-125 MG per tablet Take 1 tablet by mouth 2 (two) times daily.  20 tablet  0  . aspirin 81 MG tablet Take 81 mg by mouth daily.        . chlorpheniramine-HYDROcodone (TUSSIONEX PENNKINETIC ER) 10-8 MG/5ML LQCR Take 5 mLs by mouth every 12 (twelve) hours as needed (cough).  140 mL  1  . clonazePAM (KLONOPIN) 0.5 MG tablet Take 1 tablet (0.5 mg total) by mouth 2 (two) times daily. 1/2 tab  30 tablet  1  .  cyanocobalamin 1000 MCG tablet Inject 1,000 mcg as directed every 30 (thirty) days.      Marland Kitchen docusate sodium (COLACE) 100 MG capsule Take 1 capsule (100 mg total) by mouth daily as needed for constipation.  30 capsule  2  . DULoxetine (CYMBALTA) 30 MG capsule Take 30 mg by mouth daily.        . fenofibrate 160 MG tablet Take 160 mg by mouth daily.        . Ferrous Fumarate-Folic Acid (HEMOCYTE-F) 324-1 MG TABS Take 1 tablet by mouth daily.  30 each  3  . furosemide (LASIX) 20 MG tablet Take 1 tablet (20 mg total) by mouth daily as needed. allergies  30 tablet  2  . lamoTRIgine (LAMICTAL) 150 MG tablet Take 150 mg by mouth daily. 1/2 tab       . montelukast (SINGULAIR) 10 MG tablet TAKE 1 TABLET EVERY DAY  30 tablet  1  . oxyCODONE-acetaminophen (ROXICET) 5-325 MG per tablet 1 tab po bid prn pain  60 tablet  0  . pantoprazole  (PROTONIX) 40 MG tablet Take 1 tablet (40 mg total) by mouth daily.  30 tablet  1  . pravastatin (PRAVACHOL) 40 MG tablet Take 1 tablet (40 mg total) by mouth at bedtime.  30 tablet  1  . predniSONE (DELTASONE) 20 MG tablet Take 1 tablet (20 mg total) by mouth 2 (two) times daily.  10 tablet  1  . Probiotic Product (ALIGN) 4 MG CAPS Take 1 capsule by mouth daily.  30 capsule  0  . QUEtiapine (SEROQUEL) 200 MG tablet Take 200 mg by mouth at bedtime. 1-2 tab       . warfarin (COUMADIN) 5 MG tablet Take 5 mg by mouth daily. 1/2 to 1 tab        No current facility-administered medications on file prior to visit.    Allergies  Allergen Reactions  . Sulfonamide Derivatives     Family History  Problem Relation Age of Onset  . Scoliosis Mother   . Arthritis Mother     Rheumatoid  . Aneurysm Mother     heart  . Alcohol abuse Father   . Depression Sister   . Depression Brother   . Alcohol abuse Brother   . Cancer Maternal Grandmother     colon  . Diabetes Maternal Grandfather   . Heart disease Maternal Grandfather   . Alcohol abuse Maternal Grandfather   . Depression Paternal Grandmother   . Diabetes Paternal Grandmother   . Depression Paternal Grandfather   . Diabetes Paternal Grandfather   . Depression Sister   . Alcohol abuse Brother   . Depression Brother   . Heart disease Brother     BP 148/82  Pulse 72  Temp(Src) 98.4 F (36.9 C) (Oral)  Ht 5\' 6"  (1.676 m)  Wt 233 lb 6.4 oz (105.87 kg)  BMI 37.67 kg/m2  SpO2 97%    Review of Systems Denies fever    Objective:   Physical Exam VITAL SIGNS:  See vs page GENERAL: no distress NECK: There is no palpable thyroid enlargement.  No thyroid nodule is palpable.  No palpable lymphadenopathy at the anterior neck. Neuro: slight tremor  Lab Results  Component Value Date   TSH 4.20 08/05/2011      Assessment & Plan:  Hyperthyroidism, well-controlled.  However, she want to pursue i-131, and i agree
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
## 2011-08-14 ENCOUNTER — Ambulatory Visit (INDEPENDENT_AMBULATORY_CARE_PROVIDER_SITE_OTHER): Payer: Self-pay | Admitting: Family Medicine

## 2011-08-14 ENCOUNTER — Encounter: Payer: Self-pay | Admitting: Family Medicine

## 2011-08-14 VITALS — BP 111/66 | HR 81 | Temp 98.4°F | Ht 66.0 in | Wt 231.0 lb

## 2011-08-14 DIAGNOSIS — N390 Urinary tract infection, site not specified: Secondary | ICD-10-CM

## 2011-08-14 DIAGNOSIS — F319 Bipolar disorder, unspecified: Secondary | ICD-10-CM

## 2011-08-14 DIAGNOSIS — J209 Acute bronchitis, unspecified: Secondary | ICD-10-CM

## 2011-08-14 HISTORY — DX: Urinary tract infection, site not specified: N39.0

## 2011-08-14 LAB — POCT URINALYSIS DIPSTICK
Bilirubin, UA: NEGATIVE
Glucose, UA: NEGATIVE
Ketones, UA: NEGATIVE
Nitrite, UA: NEGATIVE
Spec Grav, UA: >=1.03
Urobilinogen, UA: 0.2
pH, UA: 5.5

## 2011-08-14 MED ORDER — NITROFURANTOIN MONOHYD MACRO 100 MG PO CAPS: 100.0000 mg | ORAL_CAPSULE | Freq: Two times a day (BID) | ORAL | Status: AC

## 2011-08-14 NOTE — Assessment & Plan Note (Signed)
Patient notes some dysuria, will restart Macrobid and urine sent for culture
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
## 2011-08-14 NOTE — Patient Instructions (Signed)

## 2011-08-14 NOTE — Assessment & Plan Note (Signed)
Continues to follow with psychiatry but is having some trouble getting set up with their counselor so she is given the [facility] [employer] health info and she was encouraged to call and set up an appt. She is undergoing a divorce and dealing with a lot of stress.

## 2011-08-14 NOTE — Assessment & Plan Note (Signed)
Improving, slightly nagging cough but good exam today and patient reports feeling at least 90 % improved, no further treatment

## 2011-08-17 LAB — URINE CULTURE: Colony Count: 75000

## 2011-08-19 ENCOUNTER — Ambulatory Visit (INDEPENDENT_AMBULATORY_CARE_PROVIDER_SITE_OTHER): Payer: PRIVATE HEALTH INSURANCE

## 2011-08-19 DIAGNOSIS — E538 Deficiency of other specified B group vitamins: Secondary | ICD-10-CM

## 2011-08-19 MED ORDER — CYANOCOBALAMIN 1000 MCG/ML IJ SOLN
1000.0000 ug | Freq: Once | INTRAMUSCULAR | Status: AC
  Administered 2011-08-19: 1000 ug via INTRAMUSCULAR

## 2011-09-08 ENCOUNTER — Other Ambulatory Visit: Payer: Self-pay

## 2011-09-08 MED ORDER — PANTOPRAZOLE SODIUM 40 MG PO TBEC: 40.0000 mg | DELAYED_RELEASE_TABLET | Freq: Every day | ORAL | Status: DC

## 2011-09-10 ENCOUNTER — Other Ambulatory Visit: Payer: PRIVATE HEALTH INSURANCE

## 2011-09-12 ENCOUNTER — Telehealth: Payer: Self-pay | Admitting: *Deleted

## 2011-09-12 NOTE — Telephone Encounter (Signed)
Stay-off methimazole. Go to lab to have blood tests rechecked.

## 2011-09-12 NOTE — Telephone Encounter (Signed)
Pt was seen in early March for thyroid and did not get her results (message was left on phone tree-but pt did not get in time). Pt is asking for MD's advisement for next step in treatment of thyroid.

## 2011-09-15 NOTE — Telephone Encounter (Signed)
Pt informed of MD's advisement. 

## 2011-09-16 ENCOUNTER — Other Ambulatory Visit (INDEPENDENT_AMBULATORY_CARE_PROVIDER_SITE_OTHER): Payer: PRIVATE HEALTH INSURANCE

## 2011-09-16 ENCOUNTER — Other Ambulatory Visit: Payer: Self-pay | Admitting: Endocrinology

## 2011-09-16 ENCOUNTER — Encounter: Payer: Self-pay | Admitting: Endocrinology

## 2011-09-16 ENCOUNTER — Telehealth: Payer: Self-pay | Admitting: *Deleted

## 2011-09-16 DIAGNOSIS — E0590 Thyrotoxicosis, unspecified without thyrotoxic crisis or storm: Secondary | ICD-10-CM

## 2011-09-16 LAB — TSH: TSH: 3.31 u[IU]/mL (ref 0.35–5.50)

## 2011-09-16 LAB — T4, FREE: Free T4: 0.59 ng/dL — ABNORMAL LOW (ref 0.60–1.60)

## 2011-09-16 NOTE — Telephone Encounter (Signed)
Called pt to inform of thyroid results, pt informed of MD's advisement and letter mailed to pt).

## 2011-09-17 ENCOUNTER — Ambulatory Visit: Payer: PRIVATE HEALTH INSURANCE | Admitting: Family Medicine

## 2011-09-19 ENCOUNTER — Other Ambulatory Visit: Payer: PRIVATE HEALTH INSURANCE

## 2011-09-22 ENCOUNTER — Ambulatory Visit (INDEPENDENT_AMBULATORY_CARE_PROVIDER_SITE_OTHER): Payer: PRIVATE HEALTH INSURANCE | Admitting: Family Medicine

## 2011-09-22 DIAGNOSIS — I10 Essential (primary) hypertension: Secondary | ICD-10-CM

## 2011-09-22 DIAGNOSIS — E0590 Thyrotoxicosis, unspecified without thyrotoxic crisis or storm: Secondary | ICD-10-CM

## 2011-09-22 DIAGNOSIS — N289 Disorder of kidney and ureter, unspecified: Secondary | ICD-10-CM

## 2011-09-22 DIAGNOSIS — E785 Hyperlipidemia, unspecified: Secondary | ICD-10-CM

## 2011-09-22 DIAGNOSIS — E538 Deficiency of other specified B group vitamins: Secondary | ICD-10-CM

## 2011-09-22 LAB — HEPATIC FUNCTION PANEL
ALT: 30 U/L (ref 0–35)
AST: 36 U/L (ref 0–37)
Albumin: 4.1 g/dL (ref 3.5–5.2)
Alkaline Phosphatase: 35 U/L — ABNORMAL LOW (ref 39–117)
Bilirubin, Direct: 0.1 mg/dL (ref 0.0–0.3)
Total Bilirubin: 0.6 mg/dL (ref 0.3–1.2)
Total Protein: 6.7 g/dL (ref 6.0–8.3)

## 2011-09-22 LAB — RENAL FUNCTION PANEL
Albumin: 4.1 g/dL (ref 3.5–5.2)
BUN: 12 mg/dL (ref 6–23)
CO2: 24 mEq/L (ref 19–32)
Calcium: 9.2 mg/dL (ref 8.4–10.5)
Chloride: 107 mEq/L (ref 96–112)
Creatinine, Ser: 0.9 mg/dL (ref 0.4–1.2)
GFR: 69.48 mL/min (ref >60.00)
Glucose, Bld: 111 mg/dL — ABNORMAL HIGH (ref 70–99)
Phosphorus: 3.1 mg/dL (ref 2.3–4.6)
Potassium: 4 mEq/L (ref 3.5–5.1)
Sodium: 141 mEq/L (ref 135–145)

## 2011-09-22 LAB — LIPID PANEL
Cholesterol: 166 mg/dL (ref 0–200)
HDL: 48 mg/dL (ref >39.00)
Total CHOL/HDL Ratio: 3
Triglycerides: 203 mg/dL — ABNORMAL HIGH (ref 0.0–149.0)
VLDL: 40.6 mg/dL — ABNORMAL HIGH (ref 0.0–40.0)

## 2011-09-22 LAB — CBC
HCT: 34.5 % — ABNORMAL LOW (ref 36.0–46.0)
Hemoglobin: 11.6 g/dL — ABNORMAL LOW (ref 12.0–15.0)
MCHC: 33.7 g/dL (ref 30.0–36.0)
MCV: 89.8 fl (ref 78.0–100.0)
Platelets: 218 10*3/uL (ref 150.0–400.0)
RBC: 3.85 Mil/uL — ABNORMAL LOW (ref 3.87–5.11)
RDW: 14.1 % (ref 11.5–14.6)
WBC: 4.4 10*3/uL — ABNORMAL LOW (ref 4.5–10.5)

## 2011-09-22 LAB — TSH: TSH: 2.33 u[IU]/mL (ref 0.35–5.50)

## 2011-09-22 LAB — LDL CHOLESTEROL, DIRECT: Direct LDL: 89.1 mg/dL

## 2011-09-22 MED ORDER — CYANOCOBALAMIN 1000 MCG/ML IJ SOLN
1000.0000 ug | Freq: Once | INTRAMUSCULAR | Status: AC
  Administered 2011-09-22: 1000 ug via INTRAMUSCULAR

## 2011-09-30 NOTE — Progress Notes (Signed)
Patient ID: [name], female   DOB: [birth year], 61 y.o.   MRN: [id number] Patient in for vitamin B12 shot tolerated well

## 2011-10-06 ENCOUNTER — Telehealth: Payer: Self-pay | Admitting: Family Medicine

## 2011-10-06 MED ORDER — AMOXICILLIN-POT CLAVULANATE 875-125 MG PO TABS: 1.0000 | ORAL_TABLET | Freq: Two times a day (BID) | ORAL | Status: DC

## 2011-10-06 NOTE — Telephone Encounter (Signed)
Please advise 

## 2011-10-06 NOTE — Telephone Encounter (Signed)
Augmentin called into [facility] at [phone]

## 2011-10-06 NOTE — Telephone Encounter (Signed)
Last note entered in wrong chart disregard PT/INR oder and labs with appt statement. She can have an antibiotic, give her Augmentin 875 1 tab po bid x 7 days.

## 2011-10-06 NOTE — Telephone Encounter (Signed)
They can check PT/INR every 2 weeks and forward [place] a copy but I need her back in next 2 weeks for follow up and lab work fasting, hgba1c, pt/nr, lipid, renal, hepatic, tsh, cbc, sed rate

## 2011-10-21 ENCOUNTER — Ambulatory Visit: Payer: PRIVATE HEALTH INSURANCE

## 2011-10-27 ENCOUNTER — Encounter: Payer: Self-pay | Admitting: Endocrinology

## 2011-10-27 ENCOUNTER — Other Ambulatory Visit (INDEPENDENT_AMBULATORY_CARE_PROVIDER_SITE_OTHER): Payer: PRIVATE HEALTH INSURANCE

## 2011-10-27 ENCOUNTER — Other Ambulatory Visit: Payer: PRIVATE HEALTH INSURANCE

## 2011-10-27 DIAGNOSIS — E0590 Thyrotoxicosis, unspecified without thyrotoxic crisis or storm: Secondary | ICD-10-CM

## 2011-10-27 LAB — TSH: TSH: 2.09 u[IU]/mL (ref 0.35–5.50)

## 2011-10-27 LAB — T4, FREE: Free T4: 0.56 ng/dL — ABNORMAL LOW (ref 0.60–1.60)

## 2011-10-28 ENCOUNTER — Ambulatory Visit (INDEPENDENT_AMBULATORY_CARE_PROVIDER_SITE_OTHER): Payer: PRIVATE HEALTH INSURANCE | Admitting: Family Medicine

## 2011-10-28 ENCOUNTER — Other Ambulatory Visit: Payer: Self-pay | Admitting: *Deleted

## 2011-10-28 ENCOUNTER — Telehealth: Payer: Self-pay | Admitting: *Deleted

## 2011-10-28 ENCOUNTER — Encounter: Payer: Self-pay | Admitting: Family Medicine

## 2011-10-28 VITALS — BP 133/76 | HR 83 | Temp 98.5°F | Ht 66.0 in | Wt 245.0 lb

## 2011-10-28 DIAGNOSIS — D510 Vitamin B12 deficiency anemia due to intrinsic factor deficiency: Secondary | ICD-10-CM

## 2011-10-28 DIAGNOSIS — D649 Anemia, unspecified: Secondary | ICD-10-CM

## 2011-10-28 DIAGNOSIS — E0590 Thyrotoxicosis, unspecified without thyrotoxic crisis or storm: Secondary | ICD-10-CM

## 2011-10-28 MED ORDER — METOPROLOL SUCCINATE ER 25 MG PO TB24: 25.0000 mg | ORAL_TABLET | Freq: Every day | ORAL | Status: DC

## 2011-10-28 MED ORDER — FERROUS FUMARATE-FOLIC ACID 324-1 MG PO TABS: 1.0000 | ORAL_TABLET | Freq: Every day | ORAL | Status: DC

## 2011-10-28 MED ORDER — CYANOCOBALAMIN 1000 MCG/ML IJ SOLN
1000.0000 ug | Freq: Once | INTRAMUSCULAR | Status: AC
  Administered 2011-10-28: 1000 ug via INTRAMUSCULAR

## 2011-10-28 NOTE — Patient Instructions (Signed)
Hyperthyroidism  The thyroid is a large gland located in the lower front part of your neck. The thyroid helps control metabolism. Metabolism is how your body uses food. It controls metabolism with the hormone thyroxine. When the thyroid is overactive, it produces too much hormone. When this happens, these following problems may occur:     Nervousness    Heat intolerance    Weight loss (in spite of increase food intake)    Diarrhea    Change in hair or skin texture    Palpitations (heart skipping or having extra beats)    Tachycardia (rapid heart rate)    Loss of menstruation (amenorrhea)    Shaking of the hands   CAUSES   Grave's Disease (the immune system attacks the thyroid gland). This is the most common cause.    Inflammation of the thyroid gland.    Tumor (usually benign) in the thyroid gland or elsewhere.    Excessive use of thyroid medications (both prescription and 'natural').    Excessive ingestion of Iodine.   DIAGNOSIS    To prove hyperthyroidism, your caregiver may do blood tests and ultrasound tests. Sometimes the signs are hidden. It may be necessary for your caregiver to watch this illness with blood tests, either before or after diagnosis and treatment.  TREATMENT  Short-term treatment  There are several treatments to control symptoms. Drugs called beta blockers may give some relief. Drugs that decrease hormone production will provide temporary relief in many people. These measures will usually not give permanent relief.  Definitive therapy   There are treatments available which can be discussed between you and your caregiver which will permanently treat the problem. These treatments range from surgery (removal of the thyroid), to the use of radioactive iodine (destroys the thyroid by radiation), to the use of antithyroid drugs (interfere with hormone synthesis). The first two treatments are permanent and usually successful. They most often require hormone replacement therapy for life. This is because it is impossible to remove or destroy the exact amount of thyroid required to make a person euthyroid (normal).  HOME CARE INSTRUCTIONS    See your caregiver if the problems you are being treated for get worse. Examples of this would be the problems listed above.  SEEK MEDICAL CARE IF:  Your general condition worsens.  MAKE SURE YOU:     Understand these instructions.    Will watch your condition.    Will get help right away if you are not doing well or get worse.   Document Released: 05/12/2005 Document Revised: 05/01/2011 Document Reviewed: 09/23/2006  [employer] Patient Information 2012 [employer].

## 2011-10-28 NOTE — Telephone Encounter (Signed)
Called pt to inform of TSH results, left message for pt to callback office (letter also mailed to pt).
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
## 2011-10-29 NOTE — Assessment & Plan Note (Signed)
Patient is hesitant to proceed with the radioactive ablation of her thyroid but is beginning to have more and more symptoms associated with over active thyroid, including BP trending up, increased anxiety, increased tremulousness, increased sweating. She is strongly encouraged to proceed with the ablation and agrees to do so. We will also start Toprol XL 25 mg po daily to help manage her symptoms

## 2011-10-29 NOTE — Telephone Encounter (Signed)
Pt informed of lab results. 

## 2011-10-29 NOTE — Assessment & Plan Note (Signed)
Given hr Vitamin B12 shot today

## 2011-11-17 ENCOUNTER — Encounter: Payer: Self-pay | Admitting: Family Medicine

## 2011-11-17 ENCOUNTER — Ambulatory Visit (INDEPENDENT_AMBULATORY_CARE_PROVIDER_SITE_OTHER): Payer: PRIVATE HEALTH INSURANCE | Admitting: Family Medicine

## 2011-11-17 VITALS — BP 148/72 | HR 77 | Temp 97.5°F | Ht 66.0 in | Wt 241.1 lb

## 2011-11-17 DIAGNOSIS — D510 Vitamin B12 deficiency anemia due to intrinsic factor deficiency: Secondary | ICD-10-CM

## 2011-11-17 DIAGNOSIS — R079 Chest pain, unspecified: Secondary | ICD-10-CM | POA: Insufficient documentation

## 2011-11-17 DIAGNOSIS — N390 Urinary tract infection, site not specified: Secondary | ICD-10-CM

## 2011-11-17 DIAGNOSIS — R319 Hematuria, unspecified: Secondary | ICD-10-CM

## 2011-11-17 DIAGNOSIS — N644 Mastodynia: Secondary | ICD-10-CM | POA: Insufficient documentation

## 2011-11-17 DIAGNOSIS — R609 Edema, unspecified: Secondary | ICD-10-CM

## 2011-11-17 LAB — POCT URINALYSIS DIPSTICK
Bilirubin, UA: NEGATIVE
Glucose, UA: NEGATIVE
Ketones, UA: NEGATIVE
Nitrite, UA: NEGATIVE
Protein, UA: NEGATIVE
Spec Grav, UA: 1.015
Urobilinogen, UA: 0.2
pH, UA: 6.5

## 2011-11-17 MED ORDER — AMOXICILLIN-POT CLAVULANATE 875-125 MG PO TABS: 1.0000 | ORAL_TABLET | Freq: Two times a day (BID) | ORAL | Status: AC

## 2011-11-17 MED ORDER — NITROGLYCERIN 0.4 MG SL SUBL: 0.4000 mg | SUBLINGUAL_TABLET | SUBLINGUAL | Status: DC | PRN

## 2011-11-17 MED ORDER — FUROSEMIDE 20 MG PO TABS: ORAL_TABLET | ORAL | Status: DC

## 2011-11-17 NOTE — Assessment & Plan Note (Signed)
Comes and goes, encouraged to avoid sodium, may try Furosemide 20 mg tabs 1-2 tabs po prn but also encouraged to raise feet above heart at least twice daily

## 2011-11-17 NOTE — Assessment & Plan Note (Signed)
Patient has not had a mgm in roughly 5 years. She Agrees to one today. No obvious lesions on exam.

## 2011-11-17 NOTE — Addendum Note (Signed)
Addended by: Baldemar Lenis R on: 11/17/2011 02:06 PM   Modules accepted: Orders

## 2011-11-17 NOTE — Assessment & Plan Note (Signed)
She describes it as sharp and can occur with position changes at rest and with exertion. Of concern is that her CP only started this week but her SOB has been progressively worsening for the past month and feels similar to patient to when she had her valve replacement. She is encouraged to keep NTG with her and to see her cardiologist soon. She agrees and will seek immediate care if she has cp that is unresolved with NTG use.

## 2011-11-17 NOTE — Progress Notes (Signed)
Patient ID: [name], female   DOB: 1950/07/26, 61 y.o.   MRN: [id number] SHAYLAN TUTTON [id number] 29-Sep-1950 11/17/2011      Progress Note-Follow Up  Subjective  Chief Complaint  Chief Complaint  Patient presents with  . Breast Pain    both X 4 days  . Urinary Tract Infection    HPI  Patient is a 61 year old Caucasian female who is in today for evaluation of multiple medical problems. She's been having trouble with urinary discomfort for about 4 days now. Notes in the last week she's had some episodes of frequency and urgency as well. Denies any hematuria, fevers, chills, belly or back pain that are increased. Is also noting breast pain for about 4 days. Denies any lesions or discharge. Pain is present bilaterally. She's not had a mammogram in roughly 5 years. She's also noting some increasing shortness of breath for about the last month says it is somewhat similar to when she needed her heart valve replaced but did not start having chest pain for the last couple days. She describes it as being substernal and sharp. Often comes on with movement and change in position but also can occur randomly at rest or with ambulation. Tends to resolve fairly quickly. No diaphoresis or nausea associated with these episodes.  Past Medical History  Diagnosis Date  . Anemia   . Anxiety   . Depression   . Diverticulosis   . Elevated cholesterol   . Obesity   . THYROMEGALY 07/22/2010  . Overweight 03/11/2010  . Mixed hyperlipidemia 03/11/2010  . MEASLES, HX OF 03/11/2010  . KNEE PAIN, RIGHT 05/28/2010  . HYPERTRIGLYCERIDEMIA 03/11/2010  . HEART MURMUR, HX OF 03/11/2010  . FIBROIDS, UTERUS 03/11/2010  . Diarrhea 08/05/2010  . CONSTIPATION 03/11/2010  . CHICKENPOX, HX OF 03/11/2010  . BIPOLAR AFFECTIVE DISORDER 03/11/2010  . AORTIC VALVE REPLACEMENT, HX OF 03/11/2010  . ANEMIA 05/28/2010  . Acute bronchitis 07/22/2010  . Abdominal pain, generalized 03/19/2010  . Peripheral edema 11/18/2010  .  Bacterial vaginitis 11/18/2010  . Hematuria 12/18/2010  . Diverticulitis 12/18/2010  . Hyperthyroidism 12/18/2010  . Fatigue 12/30/2010  . Renal insufficiency 12/30/2010  . Grave's disease 8-12  . Arm lesion 03/23/2011  . UTI (lower urinary tract infection) 08/14/2011  . Breast pain in female 11/17/2011  . Chest pain 11/17/2011    Past Surgical History  Procedure Date  . Appendectomy     Required revision for EColi infection, required recurrent packing  . Bowel obstruction     Requiring adhesions to be removed  . Abdominal hysterectomy     sb/l spo, total  . Aortic valve replacement   . Open heart with a cardiac aneurysm repair   . Cholecystectomy   . Tubal ligation   . Varicose vein surgery b/l legs   . Childhood exploratory surgery of heart   . Hernia repair 08-16-10    Family History  Problem Relation Age of Onset  . Scoliosis Mother   . Arthritis Mother     Rheumatoid  . Aneurysm Mother     heart  . Alcohol abuse Father   . Depression Sister   . Depression Brother   . Alcohol abuse Brother   . Cancer Maternal Grandmother     colon  . Diabetes Maternal Grandfather   . Heart disease Maternal Grandfather   . Alcohol abuse Maternal Grandfather   . Depression Paternal Grandmother   . Diabetes Paternal Grandmother   . Depression Paternal Grandfather   .  Diabetes Paternal Grandfather   . Depression Sister   . Alcohol abuse Brother   . Depression Brother   . Heart disease Brother     History   Social History  . Marital Status: Married    Spouse Name: N/A    Number of Children: N/A  . Years of Education: N/A   Occupational History  . Not on file.   Social History Main Topics  . Smoking status: Former Smoker    Quit date: 05/26/1990  . Smokeless tobacco: Never Used  . Alcohol Use: 0.0 oz/week    0 drink(s) per week     very serious occasions  . Drug Use: No  . Sexually Active: No   Other Topics Concern  . Not on file   Social History Narrative  . No  narrative on file    Current Outpatient Prescriptions on File Prior to Visit  Medication Sig Dispense Refill  . aspirin 81 MG tablet Take 81 mg by mouth daily.        . clonazePAM (KLONOPIN) 0.5 MG tablet Take 0.5 mg by mouth 2 (two) times daily.      . cyanocobalamin 1000 MCG tablet Inject 1,000 mcg as directed every 30 (thirty) days.      Tery Sanfilippo Calcium (STOOL SOFTENER PO) Take 1 capsule by mouth daily.      . fenofibrate 160 MG tablet Take 160 mg by mouth daily.        . Ferrous Fumarate-Folic Acid (HEMOCYTE-F) 324-1 MG TABS Take 1 tablet by mouth daily.  30 each  3  . lamoTRIgine (LAMICTAL) 150 MG tablet Take 150 mg by mouth daily.       . metoprolol succinate (TOPROL-XL) 25 MG 24 hr tablet Take 1 tablet (25 mg total) by mouth daily.  30 tablet  3  . montelukast (SINGULAIR) 10 MG tablet TAKE 1 TABLET EVERY DAY  30 tablet  1  . oxyCODONE-acetaminophen (ROXICET) 5-325 MG per tablet 1 tab po bid prn pain  60 tablet  0  . pantoprazole (PROTONIX) 40 MG tablet Take 1 tablet (40 mg total) by mouth daily.  30 tablet  3  . pravastatin (PRAVACHOL) 40 MG tablet Take 1 tablet (40 mg total) by mouth at bedtime.  30 tablet  1  . Probiotic Product (ALIGN) 4 MG CAPS Take 1 capsule by mouth daily.  30 capsule  0  . QUEtiapine (SEROQUEL) 200 MG tablet Take 200 mg by mouth at bedtime. 1-2 tab       . warfarin (COUMADIN) 5 MG tablet Take 5 mg by mouth daily. 1/2 to 1 tab       . acetaminophen (TYLENOL) 325 MG tablet Take 650 mg by mouth every 6 (six) hours as needed.        Marland Kitchen albuterol (PROVENTIL HFA;VENTOLIN HFA) 108 (90 BASE) MCG/ACT inhaler Inhale 2 puffs into the lungs every 6 (six) hours as needed for wheezing.  1 Inhaler  1  . DULoxetine (CYMBALTA) 30 MG capsule Take 30 mg by mouth daily.        . furosemide (LASIX) 20 MG tablet 1-2 tabs po daily prn edema  30 tablet  2  . nitroGLYCERIN (NITROSTAT) 0.4 MG SL tablet Place 1 tablet (0.4 mg total) under the tongue every 5 (five) minutes as needed for  chest pain.  25 tablet  3  . DISCONTD: methimazole (TAPAZOLE) 5 MG tablet Take 1 tablet (5 mg total) by mouth 3 (three) times daily.  30 tablet  2    Allergies  Allergen Reactions  . Sulfonamide Derivatives     Review of Systems  Review of Systems  Constitutional: Positive for malaise/fatigue. Negative for fever.  HENT: Negative for congestion.   Eyes: Negative for discharge.  Respiratory: Positive for shortness of breath.   Cardiovascular: Positive for chest pain. Negative for palpitations and leg swelling.  Gastrointestinal: Negative for nausea, abdominal pain and diarrhea.  Genitourinary: Positive for dysuria and frequency. Negative for urgency and flank pain.  Musculoskeletal: Negative for falls.  Skin: Negative for rash.  Neurological: Negative for loss of consciousness and headaches.  Endo/Heme/Allergies: Negative for polydipsia.  Psychiatric/Behavioral: Negative for depression and suicidal ideas. The patient is not nervous/anxious and does not have insomnia.     Objective  BP 148/72  Pulse 77  Temp 97.5 F (36.4 C) (Temporal)  Ht 5\' 6"  (1.676 m)  Wt 241 lb 1.9 oz (109.371 kg)  BMI 38.92 kg/m2  SpO2 95%  Physical Exam  Physical Exam  Constitutional: She is oriented to person, place, and time and well-developed, well-nourished, and in no distress. No distress.  HENT:  Head: Normocephalic and atraumatic.  Eyes: Conjunctivae are normal.  Neck: Neck supple. No thyromegaly present.  Cardiovascular: Normal rate and regular rhythm.   Murmur heard.      3/6 systolic murmur and click  Pulmonary/Chest: Effort normal and breath sounds normal. She has no wheezes.  Abdominal: She exhibits no distension and no mass.  Genitourinary:       Breast exam no masses, lesions or skin changes b/l. Patient does note increased pain with palp in right upper, outer quadrant  Musculoskeletal: She exhibits no edema.  Lymphadenopathy:    She has no cervical adenopathy.  Neurological:  She is alert and oriented to person, place, and time.  Skin: Skin is warm and dry. No rash noted. She is not diaphoretic.  Psychiatric: Memory, affect and judgment normal.    Lab Results  Component Value Date   TSH 2.09 10/27/2011   Lab Results  Component Value Date   WBC 4.4* 09/22/2011   HGB 11.6* 09/22/2011   HCT 34.5* 09/22/2011   MCV 89.8 09/22/2011   PLT 218.0 09/22/2011   Lab Results  Component Value Date   CREATININE 0.9 09/22/2011   BUN 12 09/22/2011   NA 141 09/22/2011   K 4.0 09/22/2011   CL 107 09/22/2011   CO2 24 09/22/2011   Lab Results  Component Value Date   ALT 30 09/22/2011   AST 36 09/22/2011   ALKPHOS 35* 09/22/2011   BILITOT 0.6 09/22/2011   Lab Results  Component Value Date   CHOL 166 09/22/2011   Lab Results  Component Value Date   HDL 48.00 09/22/2011   No results found for this basename: LDLCALC   Lab Results  Component Value Date   TRIG 203.0* 09/22/2011   Lab Results  Component Value Date   CHOLHDL 3 09/22/2011     Assessment & Plan  UTI (lower urinary tract infection) Started on Augmentin bid and urine sent for culture, continue probiotics  Pernicious anemia Too soon for repeat shot, will continue monthly injections  Peripheral edema Comes and goes, encouraged to avoid sodium, may try Furosemide 20 mg tabs 1-2 tabs po prn but also encouraged to raise feet above heart at least twice daily  Chest pain She describes it as sharp and can occur with position changes at rest and with exertion. Of concern is that her CP only started this  week but her SOB has been progressively worsening for the past month and feels similar to patient to when she had her valve replacement. She is encouraged to keep NTG with her and to see her cardiologist soon. She agrees and will seek immediate care if she has cp that is unresolved with NTG use.  Breast pain in female Patient has not had a mgm in roughly 5 years. She Agrees to one today. No obvious lesions on  exam.

## 2011-11-17 NOTE — Assessment & Plan Note (Signed)
Too soon for repeat shot, will continue monthly injections

## 2011-11-17 NOTE — Patient Instructions (Addendum)

## 2011-11-17 NOTE — Assessment & Plan Note (Signed)
Started on Augmentin bid and urine sent for culture, continue probiotics

## 2011-11-19 LAB — URINE CULTURE: Colony Count: >=100000

## 2011-11-20 ENCOUNTER — Ambulatory Visit
Admission: RE | Admit: 2011-11-20 | Discharge: 2011-11-20 | Disposition: A | Discharge status: Home / Self Care | Payer: PRIVATE HEALTH INSURANCE | Source: Ambulatory Visit | Attending: Family Medicine | Admitting: Family Medicine

## 2011-11-20 DIAGNOSIS — N644 Mastodynia: Secondary | ICD-10-CM

## 2011-11-28 ENCOUNTER — Ambulatory Visit (INDEPENDENT_AMBULATORY_CARE_PROVIDER_SITE_OTHER): Payer: PRIVATE HEALTH INSURANCE | Admitting: *Deleted

## 2011-11-28 DIAGNOSIS — D510 Vitamin B12 deficiency anemia due to intrinsic factor deficiency: Secondary | ICD-10-CM

## 2011-11-28 MED ORDER — CYANOCOBALAMIN 1000 MCG/ML IJ SOLN
1000.0000 ug | Freq: Once | INTRAMUSCULAR | Status: AC
  Administered 2011-11-28: 1000 ug via INTRAMUSCULAR

## 2011-11-28 NOTE — Progress Notes (Signed)
Pt presented for monthly B12 injection.  Pt tolerated well in Left Deltoid.

## 2011-12-02 ENCOUNTER — Encounter (HOSPITAL_COMMUNITY): Payer: Self-pay | Admitting: Pharmacy Technician

## 2011-12-03 ENCOUNTER — Other Ambulatory Visit: Payer: Self-pay | Admitting: Cardiology

## 2011-12-03 ENCOUNTER — Other Ambulatory Visit: Payer: Self-pay | Admitting: Interventional Cardiology

## 2011-12-03 NOTE — H&P (Signed)
PRE CATH WORK UP/HS/LABS/SEE LINDA.      HPI:     General:           [name] is a 61 yo female followed by Dr Katrinka Blazing with a hx of Aortic Valve replacement on Coumadin therapy, hypertension, and hypercholesterolemia. She was seen by Dr Katrinka Blazing after increase dyspnea X 1 month with chest and mid back pain. Her discomfort continues intermittently. She did have 1 episode yesterday after eating mashed potatoes lasted 30 seconds. Nuclear stress testing positive for inferior-basal ischemia with decreased LV function of 40-50%. Dr Katrinka Blazing plans to proceed with cardiac cath and pt is being placed on Lovenox prior to procedure, guided by [facility], Coumadin clinic. Marland Kitchen         Patient denies palpitations, syncope, swelling, nor PND. She occasionally will feel skipped beats but no other associated symptoms. Occasional dizziness associated with nausea. She has SOB with activity such as walking up stairs.     ROS:      as noted in HPI, no allergy to IVP dye, no other GI complaints, no balck or bloody BMs, no neurological chagnes, no fever, appetite stable, no cough no congestion, no back problems.     Medical History: Aortic valve replacement with #28 [employer] and replacement of the ascending aorta 05/22/2008, Mild to moderate coronary disease by catheter in 2007, less than 60% LAD and circumflex lesions., Left ventricular systolic dysfunction with EF of 50% by catheter 2007, varicose veins: eval at vascular and Vein specialist; Dr. Hart [place], Asthma, Chronic joint pain, Allergies, Depression, Elevated cholesterol, Diverticulitis.      Surgical History: Abdominal Hysterectomy, total with BSO-bowels removed & cleaned , Cholecystectomy , Diverticulitis with bowel drug , aortic valve replacement , SVG to OM, and repair of aortic aneurysm 05/22/08, Adhesions unwraped around bowels , inguinal hernia repair , Appendectomy operative by wound infection September 2011.      Hospitalization/Major Diagnostic Procedure:  surgeries , child birth x 2 .      Family History:  Father: deceased 33 yrs kidney problems Mother: alive COPD, coronary artery disease, aneurysm, and defibrillator Brother 1: deceased hepatitis C, suicide Sister 1: alive good Sister 2: alive good Paternal aunt: breast cancer 1 brother(s) , 2 sister(s) . 1 son(s) , 1 daughter(s) .      Social History:      General: History of smoking  cigarettes:  Never smoked. no Smoking. no Tobacco Exposure. Alcohol: yes, Rare. no Recreational drug use. Exercise: yes, walking daily in getting in the pool. has not been able to do much lately. Occupation: unemployed. Marital Status: married. Children: 1, Boys, 1, girls. Religion: yes, Baptist. Seat belt use: yes.      Medications: Warfarin Sodium 5 MG Tablet TAKE 1 TABLET BY MOUTH EVERY DAY OR AS DIRECTED , Protonix 40 MG Tablet Delayed Release 1 tablet Once a day, Effexor XR 150 MG Capsule Extended Release 24 Hour 1 capsule with food Once a day, Fenofibrate 160 MG Tablet 1 tablet once a day, Seroquel 200 mg Tablet 2 tablets At night time, Lamictal 150 mg Tablet 1 tablet Once a day, Klonopin 0.5 MG Tablet 1 tablet twice a day, Amoxicillin 500 MG Capsule 4 capsules 1 hr pre-dental As Needed, Singulair 10 mg daily, Fish Oil 1000 MG Capsule 1 capsule with a meal Once a day, Tylenol 325 MG Tablet 2 tablets every 6 hours as needed, Probiotic Capsule 1 capsule daily, Pravastatin Sodium 40 MG Tablet TAKE 1 TABLET BY  MOUTH EVERY NIGHT AT BEDTIME , Metoprolol Succinate 25 MG Tablet Extended Release 24 Hour 1 tablet Once a day, Albuterol Sulfate HFA 108 (90 Base) MCG/ACT Aerosol Solution 2 puffs as needed As needed, Cymbalta 30 MG Capsule Delayed Release Particles 1 capsule once a day, Furosemide 20 MG Tablet 1 tablet once a day---may take an extra once a day as needed, Aspir-81 81 MG Tablet Delayed Release 1 once a day, Nitroglycerin 0.4 mg 0.4 mg tablet 1 tablet as directed as directed prn chest pain, Lovenox 120 MG/0.8ML Solution  0.73 mL (110 mg) every 12 hrs with first dose starting 12/02/11 AM, Medication List reviewed and reconciled with the patient     Allergies: Sulfa drugs (for allergy).      Objective:    Vitals: Wt 245.2, Wt change 2.4 lb, Ht 67.5, BMI 37.83, Pulse sitting 70, BP sitting 128/70.     Examination:     Cardiology Exam:         GENERAL APPEARANCE: pleasant, NAD, .  HEENT: normal.  CAROTID UPSTROKE: normal, no bruit, upstrokes intact.  JVD: flat, no murmur. mechanical valve click note.Marland Kitchen  HEART: regular rhythm. + mechanical valve sound, normal S1S2.  HEART MURMUR: grade 1/6, systolic, RUSB. Marland Kitchen  LUNGS: clear to auscultation, no wheezing/rhonchi/rales.  ABDOMEN: soft, non-tender, + bowel sounds.  EXTREMITIES: no leg edema.  PERIPHERAL PULSES: 2+, bilateral.  NEUROLOGIC: grossly intact, no focal findings.  MOOD: Appropriate..            Assessment:    Assessment:  1. Chest pain - 786.50 (Primary), with recent + nuclear stress test, will proceed with cardiac cath per Dr Katrinka Blazing with radial approach, at [facility] lab    2. Hx of aortic valve replacement, mechanical - V43.3   3. Atrial fibrillation - 427.31     Plan:    1. Chest pain        LAB: Comp Metabolic Panel      GLUCOSE 104 70-99 - mg/dL H       BUN 16 1-30 - mg/dL        CREATININE 8.65 0.60-1.30 - mg/dl         eGFR (NON-AFRICAN AMERICAN) 49 >60 - calc L        eGFR (AFRICAN AMERICAN) 60 >60 - calc L       SODIUM 143 136-145 - mmol/L        POTASSIUM 4.5 3.5-5.5 - mmol/L        CHLORIDE 103 98-107 - mmol/L        C02 31 22-32 - mg/dL        ANION GAP 78.4 6.9-62.9 - mmol/L        CALCIUM 9.6 8.6-10.3 - mg/dL        T PROTEIN 6.9 5.2-8.4 - g/dL        ALBUMIN 4.4 1.3-2.4 - g/dL        T.BILI 0.4 0.3-1.0 - mg/dL         ALP 33 40-102 - U/L L       AST 31 0-39 - U/L        ALT 25 0-52 - U/L               [name] A 12/02/2011 01:20:46 PM > ok for cath, but have her hold her Lasix the day prior and morning of cath please Harward,Amy  12/02/2011 01:34:04 PM > Pt notified.        LAB: CBC with Diff  WBC 5.5 4.0-11.0 - K/ul         RBC 3.95 4.20-5.40 - M/uL L       HGB 12.2 12.0-16.0 - g/dL         HCT 16.1 09.6-04.5 - % L       MCH 31.0 27.0-33.0 - pg        MPV 9.7 7.5-10.7 - fL        MCV 91.6 81.0-99.0 - fL        MCHC 33.8 32.0-36.0 - g/dL        RDW 40.9 81.1-91.4 - %        NRBC# 0.00 -        PLT 217 150-400 - K/uL        NEUT % 51.1 43.3-71.9 - %        NRBC% 0.10 - %        LYMPH% 35.5 16.8-43.5 - %        MONO % 9.3 4.6-12.4 - %        EOS % 3.1 0.0-7.8 - %        BASO % 1.0 0.0-1.0 - %        NEUT # 2.8 1.9-7.2 - K/uL        LYMPH# 1.90 1.10-2.70 - K/uL        MONO # 0.5 0.3-0.8 - K/uL        EOS # 0.2 0.0-0.6 - K/uL        BASO # 0.1 0.0-0.1 - K/uL               [name] A 12/02/2011 01:20:25 PM > ok for cath        LAB: PT and PTT ([id number])      aPTT 37 24-33 - SEC H        INR 2.6 0.8-1.2 - H        Prothrombin Time 27.3 9.1-12.0 - SEC H              [name] A 12/03/2011 08:15:02 AM > pre cath--coumadin has been on hold X 1 day at this time and taking Lovenox   Risks and benefits of cardiac catheterization have been reviewed including risk of stroke, heart attack, death, bleeding, renal impariment and arterial damage. There was ample oppurtuny to answer questions. Alternatives were discussed. Patient understands and wishes to proceed.  She stopped her Coumadin yesterday and started Lovenox,which she is aware to hold the pm prior to procedure and am of .          Immunizations:       Labs:      Procedure Codes: 21308 ECL COMP METABOLIC PANEL, 85025 ECL CBC PLATELET DIFF, 65784 BLOOD COLLECTION ROUTINE VENIPUNCTURE     Preventive:           Follow Up: HS pending cath (Reason: S/P Cardiac cath)        Provider: Michaell Cowing. Emelda Fear, NP  Patient: [name], [name]  DOB: 06/04/1950  Date: 12/02/2011

## 2011-12-05 ENCOUNTER — Encounter (HOSPITAL_COMMUNITY)
Admission: RE | Disposition: A | Discharge status: Home / Self Care | Payer: Self-pay | Source: Ambulatory Visit | Attending: Interventional Cardiology

## 2011-12-05 ENCOUNTER — Ambulatory Visit (HOSPITAL_COMMUNITY)
Admission: RE | Admit: 2011-12-05 | Discharge: 2011-12-05 | Disposition: A | Discharge status: Home / Self Care | Payer: PRIVATE HEALTH INSURANCE | Source: Ambulatory Visit | Attending: Interventional Cardiology | Admitting: Interventional Cardiology

## 2011-12-05 DIAGNOSIS — R0609 Other forms of dyspnea: Secondary | ICD-10-CM | POA: Insufficient documentation

## 2011-12-05 DIAGNOSIS — Z954 Presence of other heart-valve replacement: Secondary | ICD-10-CM | POA: Insufficient documentation

## 2011-12-05 DIAGNOSIS — R079 Chest pain, unspecified: Secondary | ICD-10-CM | POA: Insufficient documentation

## 2011-12-05 DIAGNOSIS — R9439 Abnormal result of other cardiovascular function study: Secondary | ICD-10-CM | POA: Insufficient documentation

## 2011-12-05 DIAGNOSIS — R0989 Other specified symptoms and signs involving the circulatory and respiratory systems: Secondary | ICD-10-CM | POA: Insufficient documentation

## 2011-12-05 HISTORY — PX: LEFT HEART CATHETERIZATION WITH CORONARY ANGIOGRAM: SHX5451

## 2011-12-05 LAB — PRO B NATRIURETIC PEPTIDE: Pro B Natriuretic peptide (BNP): 346.5 pg/mL — ABNORMAL HIGH (ref 0–125)

## 2011-12-05 LAB — PROTIME-INR
INR: 1.25 (ref 0.00–1.49)
Prothrombin Time: 16 seconds — ABNORMAL HIGH (ref 11.6–15.2)

## 2011-12-05 SURGERY — LEFT HEART CATHETERIZATION WITH CORONARY ANGIOGRAM
Anesthesia: LOCAL

## 2011-12-05 MED ORDER — DIAZEPAM 5 MG PO TABS
ORAL_TABLET | ORAL | Status: AC
  Administered 2011-12-05: 5 mg via ORAL
  Filled 2011-12-05: qty 1

## 2011-12-05 MED ORDER — DIAZEPAM 5 MG PO TABS
5.0000 mg | ORAL_TABLET | ORAL | Status: AC
  Administered 2011-12-05: 5 mg via ORAL

## 2011-12-05 MED ORDER — SODIUM CHLORIDE 0.9 % IV SOLN
INTRAVENOUS | Status: DC
  Administered 2011-12-05: 08:00:00 via INTRAVENOUS

## 2011-12-05 MED ORDER — HEPARIN (PORCINE) IN NACL 2-0.9 UNIT/ML-% IJ SOLN
INTRAMUSCULAR | Status: AC
  Filled 2011-12-05: qty 1000

## 2011-12-05 MED ORDER — NITROGLYCERIN 0.2 MG/ML ON CALL CATH LAB
INTRAVENOUS | Status: AC
  Filled 2011-12-05: qty 1

## 2011-12-05 MED ORDER — MIDAZOLAM HCL 2 MG/2ML IJ SOLN
INTRAMUSCULAR | Status: AC
  Filled 2011-12-05: qty 2

## 2011-12-05 MED ORDER — SODIUM CHLORIDE 0.9 % IJ SOLN: 3.0000 mL | Freq: Two times a day (BID) | INTRAMUSCULAR | Status: DC

## 2011-12-05 MED ORDER — LIDOCAINE HCL (PF) 1 % IJ SOLN
INTRAMUSCULAR | Status: AC
  Filled 2011-12-05: qty 30

## 2011-12-05 MED ORDER — ASPIRIN 81 MG PO CHEW
CHEWABLE_TABLET | ORAL | Status: AC
  Administered 2011-12-05: 324 mg via ORAL
  Filled 2011-12-05: qty 4

## 2011-12-05 MED ORDER — FENTANYL CITRATE 0.05 MG/ML IJ SOLN
INTRAMUSCULAR | Status: AC
  Filled 2011-12-05: qty 2

## 2011-12-05 MED ORDER — SODIUM CHLORIDE 0.9 % IV SOLN: 250.0000 mL | INTRAVENOUS | Status: DC | PRN

## 2011-12-05 MED ORDER — ASPIRIN 81 MG PO CHEW
324.0000 mg | CHEWABLE_TABLET | ORAL | Status: AC
  Administered 2011-12-05: 324 mg via ORAL

## 2011-12-05 MED ORDER — SODIUM CHLORIDE 0.9 % IJ SOLN: 3.0000 mL | INTRAMUSCULAR | Status: DC | PRN

## 2011-12-05 NOTE — CV Procedure (Signed)
     Diagnostic Cardiac Catheterization Report  [name]  62 y.o.  female Sep 03, 1950  Procedure Date: 12/05/2011 Referring Physician: Mendel Ryder, III, M.D. Primary Cardiologist:: Gwynneth Albright, M.D.   PROCEDURE:  Left heart catheterization with selective coronary angiography, left ventriculogram.  INDICATIONS:  Abnormal nuclear perfusion study, chest discomfort, dyspnea, and prior history of aortic root and aortic valve replacement with coronary reimplantation. The studies being done to rule out ostial coronary artery disease and development of the native epicardial disease since her operation.  The risks, benefits, and details of the procedure were explained to the patient.  The patient verbalized understanding and wanted to proceed.  Informed written consent was obtained.  PROCEDURE TECHNIQUE:  After Xylocaine anesthesia a 5 French sheath was placed in the right femoral artery with a single anterior needle wall stick.   Coronary angiography was done using a 4.0, 3.5, and 3.0 cm left Judkins 5 [place] and JR 4 right Judkins 5 [place] catheters.  Left ventriculography was done using a not performed due to the patient's mechanical valve in the aortic position.    CONTRAST:  Total of 120 cc.  COMPLICATIONS:  None.    HEMODYNAMICS:  Aortic pressure was 170/78 mmHg.  ANGIOGRAPHIC DATA:   NOTE: Coronaries have been reimplanted. Because of the aortic root configuration a 3 cm left Judkins catheter was needed for left coronary angiography were performed from the right femoral approach   The left main coronary artery is 30% concentric proximal to mid body stenosis at the site of attachment to the aortic tent.  The left anterior descending artery is widely patent without evidence of atherosclerosis/obstruction..  The left circumflex artery is widely patent and without obstruction..  The right coronary artery is widely patent and without obstruction.Marland Kitchen  LEFT VENTRICULOGRAM:  Not  performed.  IMPRESSIONS:  1. 30% concentric proximal to mid left main  2. Otherwise widely patent coronary arteries  3. Left ventriculography was not performed in the setting of a mechanical aortic valve prosthesis.  4. False positive myocardial perfusion study. Chest discomfort is of uncertain cause.   RECOMMENDATION:  Continue medical therapy. Consider increasing diuresis to improve volume status. Due to dyspnea a BNP level will be obtained.Marland Kitchen

## 2011-12-05 NOTE — H&P (Signed)
The patient is 35 and has a history of bicuspid aortic valve with mechanical valve replacement and a sending aortic root reconstruction with coronary reimplantation. Over the past 4-6 weeks she has experienced exertional dyspnea and chest tightness. She underwent nuclear scin and there was evidence of inferobasal ischemia. LV function was lower normal with an EF greater than 50%.  Coronary angiography is indicated to exclude the possibility of ostial coronary narrowing at the reimplantation sites. We also need to exclude native coronary disease progression since the time of her operation.  Because of the patient's mechanical valve and the need to use guidewire frequently prolapsing on the aortic valve, I have ruled out performing this case from the radial approach. I initially preferred the radial approach because it would decrease the risk of bleeding given her anticoagulation. The current INR allows [place] to proceed with right femoral catheterization. We will attempt to use a closure device put post procedure.  The increased risk of bleeding, as well as the general risks of stroke, MI, death, limb ischemia, kidney impairment, allergy, among others were discussed in detail with the patient. Should PCI be a possibility for this patient, we also discussed ramifications of triple drug therapy (aspirin, thienopyridine, and Coumadin).

## 2011-12-06 ENCOUNTER — Emergency Department (HOSPITAL_COMMUNITY)
Admission: EM | Admit: 2011-12-06 | Discharge: 2011-12-06 | Disposition: A | Discharge status: Home / Self Care | Payer: PRIVATE HEALTH INSURANCE | Attending: Emergency Medicine | Admitting: Emergency Medicine

## 2011-12-06 ENCOUNTER — Encounter (HOSPITAL_COMMUNITY): Payer: Self-pay | Admitting: *Deleted

## 2011-12-06 DIAGNOSIS — F329 Major depressive disorder, single episode, unspecified: Secondary | ICD-10-CM | POA: Insufficient documentation

## 2011-12-06 DIAGNOSIS — E669 Obesity, unspecified: Secondary | ICD-10-CM | POA: Insufficient documentation

## 2011-12-06 DIAGNOSIS — E782 Mixed hyperlipidemia: Secondary | ICD-10-CM | POA: Insufficient documentation

## 2011-12-06 DIAGNOSIS — R109 Unspecified abdominal pain: Secondary | ICD-10-CM | POA: Insufficient documentation

## 2011-12-06 DIAGNOSIS — R1031 Right lower quadrant pain: Secondary | ICD-10-CM

## 2011-12-06 DIAGNOSIS — Z87891 Personal history of nicotine dependence: Secondary | ICD-10-CM | POA: Insufficient documentation

## 2011-12-06 DIAGNOSIS — Z9889 Other specified postprocedural states: Secondary | ICD-10-CM | POA: Insufficient documentation

## 2011-12-06 DIAGNOSIS — M79609 Pain in unspecified limb: Secondary | ICD-10-CM | POA: Insufficient documentation

## 2011-12-06 DIAGNOSIS — Z7901 Long term (current) use of anticoagulants: Secondary | ICD-10-CM | POA: Insufficient documentation

## 2011-12-06 DIAGNOSIS — F411 Generalized anxiety disorder: Secondary | ICD-10-CM | POA: Insufficient documentation

## 2011-12-06 DIAGNOSIS — E7800 Pure hypercholesterolemia, unspecified: Secondary | ICD-10-CM | POA: Insufficient documentation

## 2011-12-06 DIAGNOSIS — F3289 Other specified depressive episodes: Secondary | ICD-10-CM | POA: Insufficient documentation

## 2011-12-06 NOTE — ED Notes (Signed)
Pt in via EMS- per EMS pt in c/o possible bleeding at cardiac cath site that was performed yesterday, this afternoon daughter noted a large hematoma per daughter at cath site, applied pressure to site and hematoma resolved. No swelling or new bruising noted at this time, pt did have episode of chest pain this afternoon around 1pm that was resolved with SL nitro x2.  20g IV in [place] per EMS

## 2011-12-06 NOTE — ED Provider Notes (Signed)
History     CSN: [id number]  Arrival date & time 12/06/11  2045   First MD Initiated Contact with Patient 12/06/11 2046      No chief complaint on file.   (Consider location/radiation/quality/duration/timing/severity/associated sxs/prior treatment) HPI  61 year old female with history of anemia, heart murmur, currently on warfarin, and a recent cardiac cath procedure performed yesterday presents for possible internal bleeding. Patient reports she notices significant pain at her right inguinal region where the femoral cardiac catheterization was performed yesterday. She described pain as a sharp and throbbing sensation, radiating 100 out of 10 on pain scale.  Daughter notice swelling to the insertion site.  Sts she apply pressure to site and swelling reduced.  Pt reports pain has improved to 4/10 currently . Does report a brief episode of chest discomfort this afternoon which has resolved.  Denies fever, n/v.  Last INR 1.4, performed yesterday.  Did not take her lovenox today.    Past Medical History  Diagnosis Date  . Anemia   . Anxiety   . Depression   . Diverticulosis   . Elevated cholesterol   . Obesity   . THYROMEGALY 07/22/2010  . Overweight 03/11/2010  . Mixed hyperlipidemia 03/11/2010  . MEASLES, HX OF 03/11/2010  . KNEE PAIN, RIGHT 05/28/2010  . HYPERTRIGLYCERIDEMIA 03/11/2010  . HEART MURMUR, HX OF 03/11/2010  . FIBROIDS, UTERUS 03/11/2010  . Diarrhea 08/05/2010  . CONSTIPATION 03/11/2010  . CHICKENPOX, HX OF 03/11/2010  . BIPOLAR AFFECTIVE DISORDER 03/11/2010  . AORTIC VALVE REPLACEMENT, HX OF 03/11/2010  . ANEMIA 05/28/2010  . Acute bronchitis 07/22/2010  . Abdominal pain, generalized 03/19/2010  . Peripheral edema 11/18/2010  . Bacterial vaginitis 11/18/2010  . Hematuria 12/18/2010  . Diverticulitis 12/18/2010  . Hyperthyroidism 12/18/2010  . Fatigue 12/30/2010  . Renal insufficiency 12/30/2010  . Grave's disease 8-12  . Arm lesion 03/23/2011  . UTI (lower urinary tract  infection) 08/14/2011  . Breast pain in female 11/17/2011  . Chest pain 11/17/2011    Past Surgical History  Procedure Date  . Appendectomy     Required revision for EColi infection, required recurrent packing  . Bowel obstruction     Requiring adhesions to be removed  . Abdominal hysterectomy     sb/l spo, total  . Aortic valve replacement   . Open heart with a cardiac aneurysm repair   . Cholecystectomy   . Tubal ligation   . Varicose vein surgery b/l legs   . Childhood exploratory surgery of heart   . Hernia repair 08-16-10    Family History  Problem Relation Age of Onset  . Scoliosis Mother   . Arthritis Mother     Rheumatoid  . Aneurysm Mother     heart  . Alcohol abuse Father   . Depression Sister   . Depression Brother   . Alcohol abuse Brother   . Cancer Maternal Grandmother     colon  . Diabetes Maternal Grandfather   . Heart disease Maternal Grandfather   . Alcohol abuse Maternal Grandfather   . Depression Paternal Grandmother   . Diabetes Paternal Grandmother   . Depression Paternal Grandfather   . Diabetes Paternal Grandfather   . Depression Sister   . Alcohol abuse Brother   . Depression Brother   . Heart disease Brother     History  Substance Use Topics  . Smoking status: Former Smoker    Quit date: 05/26/1990  . Smokeless tobacco: Never Used  . Alcohol Use: 0.0 oz/week  0 drink(s) per week     very serious occasions    OB History    Grav Para Term Preterm Abortions TAB SAB Ect Mult Living                  Review of Systems  All other systems reviewed and are negative.    Allergies  Sulfonamide derivatives  Home Medications   Current Outpatient Rx  Name Route Sig Dispense Refill  . ACETAMINOPHEN 325 MG PO TABS Oral Take 650 mg by mouth every 6 (six) hours as needed.     . ACETAMINOPHEN-CODEINE #3 300-30 MG PO TABS Oral Take 1 tablet by mouth every 6 (six) hours as needed. Cough/pain    . ALBUTEROL SULFATE HFA 108 (90 BASE)  MCG/ACT IN AERS Inhalation Inhale 2 puffs into the lungs every 6 (six) hours as needed for wheezing. 1 Inhaler 1  . ASPIRIN 81 MG PO TABS Oral Take 81 mg by mouth daily.     Marland Kitchen CLONAZEPAM 0.5 MG PO TABS Oral Take 0.5 mg by mouth 2 (two) times daily.    . CYANOCOBALAMIN 1000 MCG/ML IJ SOLN Intramuscular Inject 1,000 mcg into the muscle every 30 (thirty) days. Administered in Physician's office Last dose 11/30/2011    . STOOL SOFTENER PO Oral Take 4 capsules by mouth at bedtime.     . FENOFIBRATE 160 MG PO TABS Oral Take 160 mg by mouth daily.     Marland Kitchen FERROUS FUMARATE-FOLIC ACID 324-1 MG PO TABS Oral Take 1 tablet by mouth daily. 30 each 3  . FUROSEMIDE 20 MG PO TABS  20 mg. 1-2 tabs po daily prn edema     . LAMOTRIGINE 150 MG PO TABS Oral Take 150 mg by mouth daily.     Marland Kitchen METOPROLOL SUCCINATE ER 25 MG PO TB24 Oral Take 1 tablet (25 mg total) by mouth daily. 30 tablet 3  . MONTELUKAST SODIUM 10 MG PO TABS  TAKE 1 TABLET EVERY DAY 30 tablet 1  . NITROGLYCERIN 0.4 MG SL SUBL Sublingual Place 1 tablet (0.4 mg total) under the tongue every 5 (five) minutes as needed for chest pain. 25 tablet 3  . PANTOPRAZOLE SODIUM 40 MG PO TBEC Oral Take 1 tablet (40 mg total) by mouth daily. 30 tablet 3  . PRAVASTATIN SODIUM 40 MG PO TABS Oral Take 1 tablet (40 mg total) by mouth at bedtime. 30 tablet 1  . ALIGN 4 MG PO CAPS Oral Take 1 capsule by mouth daily. 30 capsule 0    As needed for antibiotic use or vaginitis  . QUETIAPINE FUMARATE 200 MG PO TABS Oral Take 400 mg by mouth at bedtime. 2- 200mg  tablets at bedtime    . VENLAFAXINE HCL ER 150 MG PO CP24 Oral Take 150 mg by mouth daily.    . WARFARIN SODIUM 5 MG PO TABS Oral Take 5 mg by mouth daily. 1/2 to 1 tab: alternating days      BP 149/69  Pulse 66  Temp 98.6 F (37 C) (Oral)  Resp 20  SpO2 97%  Physical Exam  Nursing note and vitals reviewed. Constitutional: She appears well-developed and well-nourished. No distress.       obese  HENT:  Head:  Normocephalic and atraumatic.  Eyes: Conjunctivae are normal.  Neck: Neck supple.  Cardiovascular: Normal rate and regular rhythm.  Exam reveals no gallop and no friction rub.   No murmur heard.      4/6 Systolic murmur best heard at  the 2nd intercostal space on R chest, consistent with mechanical valve.    Pulmonary/Chest: No respiratory distress.  Abdominal: Soft.       Hematoma noted to R inguinal region without significant swelling, bruits, or active bleeding.  ttp to affected site.  Abdomen otherwise benign without guarding or rebound tenderness.    Good palpable dorsalis pedis pulses bilaterally.    Musculoskeletal: She exhibits no edema.  Neurological: She is alert.  Skin: Skin is warm.    ED Course  Procedures (including critical care time)  Labs Reviewed - No data to display No results found.   No diagnosis found.    MDM  Increasing pain at R femoral insertion site from recent cardiac cath performed yesterday.  No bruits, swelling, or active bleeding noted.  Discussed with my attending who will evaluate pt. Pt is currently stable, in NAD.    9:34 PM I have consulted with Dr. Donnie Aho from cardiology.  Pt is to f/u with Dr. Verdis Prime on Monday for further care.  Care instruction discussed.  Pt stable to be discharge.        Fayrene Helper, PA-C 12/06/11 2135

## 2011-12-09 ENCOUNTER — Ambulatory Visit (HOSPITAL_COMMUNITY)
Admission: RE | Admit: 2011-12-09 | Discharge: 2011-12-09 | Disposition: A | Discharge status: Home / Self Care | Payer: PRIVATE HEALTH INSURANCE | Source: Ambulatory Visit | Attending: Interventional Cardiology | Admitting: Interventional Cardiology

## 2011-12-09 ENCOUNTER — Ambulatory Visit: Payer: PRIVATE HEALTH INSURANCE | Admitting: Family Medicine

## 2011-12-09 DIAGNOSIS — I729 Aneurysm of unspecified site: Secondary | ICD-10-CM | POA: Insufficient documentation

## 2011-12-09 DIAGNOSIS — M79609 Pain in unspecified limb: Secondary | ICD-10-CM

## 2011-12-09 NOTE — Progress Notes (Signed)
VASCULAR LAB PRELIMINARY  PRELIMINARY  PRELIMINARY  PRELIMINARY  Right groin evaluation for pseudoaneurysm completed.    Preliminary report:  No evidence of a pseudoaneurysm or A-V fistula. There is a large hematoma measuring approximately 4 cm x 2.29 cm  Jerrine Urschel, RVS 12/09/2011, 3:44 PM

## 2011-12-11 NOTE — ED Provider Notes (Signed)
Medical screening examination/treatment/procedure(s) were conducted as a shared visit with non-physician practitioner(s) and myself.  I personally evaluated the patient during the encounter.  61yF with R groin pain. S/p cath yesterday. On exam she has scattered ecchymosis. Puncture site without bleeding. Palpable femoral and distal pulses. Nonfocal neuro exam. Pt denies numbness, tingling, loss of strength, sense of feeling cold or pain distally. No thrill palpated. No bruit. Suspect hematoma. Also consider anuerysm/pseudoaneurysm, fistula, etc. but doubt. [place] considered but do not feel indicated at this time. Plan symptomatic tx. Return precautions discussed.  Raeford Razor, MD 12/11/11 [phone]

## 2011-12-15 ENCOUNTER — Encounter: Payer: Self-pay | Admitting: Family Medicine

## 2011-12-15 ENCOUNTER — Ambulatory Visit (INDEPENDENT_AMBULATORY_CARE_PROVIDER_SITE_OTHER): Payer: PRIVATE HEALTH INSURANCE | Admitting: Family Medicine

## 2011-12-15 VITALS — BP 125/65 | HR 65 | Temp 97.4°F | Ht 66.0 in | Wt 240.1 lb

## 2011-12-15 DIAGNOSIS — T148XXA Other injury of unspecified body region, initial encounter: Secondary | ICD-10-CM

## 2011-12-15 DIAGNOSIS — D649 Anemia, unspecified: Secondary | ICD-10-CM

## 2011-12-15 DIAGNOSIS — R079 Chest pain, unspecified: Secondary | ICD-10-CM

## 2011-12-15 DIAGNOSIS — R7309 Other abnormal glucose: Secondary | ICD-10-CM

## 2011-12-15 DIAGNOSIS — R739 Hyperglycemia, unspecified: Secondary | ICD-10-CM

## 2011-12-15 DIAGNOSIS — N390 Urinary tract infection, site not specified: Secondary | ICD-10-CM

## 2011-12-15 DIAGNOSIS — E538 Deficiency of other specified B group vitamins: Secondary | ICD-10-CM

## 2011-12-15 LAB — HEMOGLOBIN A1C: Hgb A1c MFr Bld: 5.6 % (ref 4.6–6.5)

## 2011-12-15 LAB — RENAL FUNCTION PANEL
Albumin: 4.7 g/dL (ref 3.5–5.2)
BUN: 24 mg/dL — ABNORMAL HIGH (ref 6–23)
CO2: 27 mEq/L (ref 19–32)
Calcium: 9.9 mg/dL (ref 8.4–10.5)
Chloride: 105 mEq/L (ref 96–112)
Creatinine, Ser: 1.1 mg/dL (ref 0.4–1.2)
GFR: 51.49 mL/min — ABNORMAL LOW (ref >60.00)
Glucose, Bld: 106 mg/dL — ABNORMAL HIGH (ref 70–99)
Phosphorus: 3.5 mg/dL (ref 2.3–4.6)
Potassium: 4 mEq/L (ref 3.5–5.1)
Sodium: 142 mEq/L (ref 135–145)

## 2011-12-15 LAB — CBC
HCT: 32.3 % — ABNORMAL LOW (ref 36.0–46.0)
Hemoglobin: 10.6 g/dL — ABNORMAL LOW (ref 12.0–15.0)
MCHC: 32.9 g/dL (ref 30.0–36.0)
MCV: 91.2 fl (ref 78.0–100.0)
Platelets: 299 10*3/uL (ref 150.0–400.0)
RBC: 3.55 Mil/uL — ABNORMAL LOW (ref 3.87–5.11)
RDW: 13.8 % (ref 11.5–14.6)
WBC: 5.9 10*3/uL (ref 4.5–10.5)

## 2011-12-15 LAB — POCT URINALYSIS DIPSTICK
Bilirubin, UA: NEGATIVE
Blood, UA: NEGATIVE
Glucose, UA: NEGATIVE
Ketones, UA: NEGATIVE
Nitrite, UA: NEGATIVE
Protein, UA: NEGATIVE
Spec Grav, UA: 1.015
Urobilinogen, UA: 0.2
pH, UA: 5

## 2011-12-15 MED ORDER — CYANOCOBALAMIN 1000 MCG/ML IJ SOLN
1000.0000 ug | Freq: Once | INTRAMUSCULAR | Status: AC
  Administered 2011-12-15: 1000 ug via INTRAMUSCULAR

## 2011-12-15 NOTE — Patient Instructions (Addendum)
Hematoma   A hematoma is a pocket of blood that collects under the skin, in an organ, in a body space, in a joint space, or in other tissue. The blood can clot to form a lump that you can see and feel. The lump is often firm, sore, and sometimes even painful and tender. Most hematomas get better in a few days to weeks. However, some hematomas may be serious and require medical care. Hematomas can range in size from very small to very large.   CAUSES   A hematoma can be caused by a blunt or penetrating injury. It can also be caused by leakage from a blood vessel under the skin. Spontaneous leakage from a blood vessel is more likely to occur in elderly people, especially those taking blood thinners. Sometimes, a hematoma can develop after certain medical procedures.   SYMPTOMS   Unlike a bruise, a hematoma forms a firm lump that you can feel. This lump is the collection of blood. The collection of blood can also cause your skin to turn a blue to dark blue color. If the hematoma is close to the surface of the skin, it often produces a yellowish color in the skin.   DIAGNOSIS   Your caregiver can determine whether you have a hematoma based on your history and a physical exam.   TREATMENT   Hematomas usually go away on their own over time. Rarely does the blood need to be drained out of the body.   HOME CARE INSTRUCTIONS   Put ice on the injured area.   Put ice in a plastic bag.   Place a towel between your skin and the bag.   Leave the ice on for 15 to 20 minutes, 3 to 4 times a day for the first 1 to 2 days.   After the first 2 days, switch to using warm compresses on the hematoma.   Elevate the injured area to help decrease pain and swelling. Wrapping the area with an elastic bandage may also be helpful. Compression helps to reduce swelling and promotes shrinking of the hematoma. Make sure the bandage is not wrapped too tight.   If your hematoma is on a lower extremity and is painful, crutches may be helpful for a  couple days.   Only take over-the-counter or prescription medicines for pain, discomfort, or fever as directed by your caregiver. Most patients can take acetaminophen or ibuprofen for the pain.   SEEK IMMEDIATE MEDICAL CARE IF:   You have increasing pain, or your pain is not controlled with medicine.   You have a fever.   You have worsening swelling or discoloration.   Your skin over the hematoma breaks or starts bleeding.   MAKE SURE YOU:   Understand these instructions.   Will watch your condition.   Will get help right away if you are not doing well or get worse.   Document Released: 12/25/2003 Document Revised: 05/01/2011 Document Reviewed: 01/13/2011   [employer]® Patient Information ©2012 [employer].

## 2011-12-17 LAB — URINE CULTURE: Colony Count: 10000

## 2011-12-18 ENCOUNTER — Encounter: Payer: Self-pay | Admitting: Family Medicine

## 2011-12-18 DIAGNOSIS — T148XXA Other injury of unspecified body region, initial encounter: Secondary | ICD-10-CM | POA: Insufficient documentation

## 2011-12-18 NOTE — Assessment & Plan Note (Signed)
Has just undergone a cardiac cath and is being followed by Dr Katrinka Blazing, has a follow up appt on 8/12

## 2011-12-18 NOTE — Progress Notes (Signed)
Patient ID: [name], female   DOB: 1950/07/22, 62 y.o.   MRN: [id number] MAAME DACK [id number] 1951/01/12 12/18/2011      Progress Note-Follow Up  Subjective  Chief Complaint  Chief Complaint  Patient presents with  . Follow-up    2 week   HPI  Patient is a 61 year old Caucasian female who is in today for followup. She has undergone a cardiac angiogram since last visit unfortunately she did develop a hematoma in her right groin which is causing her great deal of discomfort. Over the last few days it has improved slightly. No chest pain, palpitations, shortness of breath at this time. She was struggling with UTI but that has also been treated. She denies any urinary symptoms, frequency, urgency. No GI complaints. Fatigue is persistent. No fevers chills. No headaches, congestion or other complaints.  Past Medical History  Diagnosis Date  . Anemia   . Anxiety   . Depression   . Diverticulosis   . Elevated cholesterol   . Obesity   . THYROMEGALY 07/22/2010  . Overweight 03/11/2010  . Mixed hyperlipidemia 03/11/2010  . MEASLES, HX OF 03/11/2010  . KNEE PAIN, RIGHT 05/28/2010  . HYPERTRIGLYCERIDEMIA 03/11/2010  . HEART MURMUR, HX OF 03/11/2010  . FIBROIDS, UTERUS 03/11/2010  . Diarrhea 08/05/2010  . CONSTIPATION 03/11/2010  . CHICKENPOX, HX OF 03/11/2010  . BIPOLAR AFFECTIVE DISORDER 03/11/2010  . AORTIC VALVE REPLACEMENT, HX OF 03/11/2010  . ANEMIA 05/28/2010  . Acute bronchitis 07/22/2010  . Abdominal pain, generalized 03/19/2010  . Peripheral edema 11/18/2010  . Bacterial vaginitis 11/18/2010  . Hematuria 12/18/2010  . Diverticulitis 12/18/2010  . Hyperthyroidism 12/18/2010  . Fatigue 12/30/2010  . Renal insufficiency 12/30/2010  . Grave's disease 8-12  . Arm lesion 03/23/2011  . UTI (lower urinary tract infection) 08/14/2011  . Breast pain in female 11/17/2011  . Chest pain 11/17/2011  . Hematoma 12/18/2011    Past Surgical History  Procedure Date  . Appendectomy     Required  revision for EColi infection, required recurrent packing  . Bowel obstruction     Requiring adhesions to be removed  . Abdominal hysterectomy     sb/l spo, total  . Aortic valve replacement   . Open heart with a cardiac aneurysm repair   . Cholecystectomy   . Tubal ligation   . Varicose vein surgery b/l legs   . Childhood exploratory surgery of heart   . Hernia repair 08-16-10    Family History  Problem Relation Age of Onset  . Scoliosis Mother   . Arthritis Mother     Rheumatoid  . Aneurysm Mother     heart  . Alcohol abuse Father   . Depression Sister   . Depression Brother   . Alcohol abuse Brother   . Cancer Maternal Grandmother     colon  . Diabetes Maternal Grandfather   . Heart disease Maternal Grandfather   . Alcohol abuse Maternal Grandfather   . Depression Paternal Grandmother   . Diabetes Paternal Grandmother   . Depression Paternal Grandfather   . Diabetes Paternal Grandfather   . Depression Sister   . Alcohol abuse Brother   . Depression Brother   . Heart disease Brother     History   Social History  . Marital Status: Married    Spouse Name: N/A    Number of Children: N/A  . Years of Education: N/A   Occupational History  . Not on file.   Social History Main Topics  .  Smoking status: Former Smoker    Quit date: 05/26/1990  . Smokeless tobacco: Never Used  . Alcohol Use: 0.0 oz/week    0 drink(s) per week     very serious occasions  . Drug Use: No  . Sexually Active: No   Other Topics Concern  . Not on file   Social History Narrative  . No narrative on file    Current Outpatient Prescriptions on File Prior to Visit  Medication Sig Dispense Refill  . acetaminophen (TYLENOL) 325 MG tablet Take 650 mg by mouth every 6 (six) hours as needed.       Marland Kitchen acetaminophen-codeine (TYLENOL #3) 300-30 MG per tablet Take 1 tablet by mouth every 6 (six) hours as needed. Cough/pain      . aspirin 81 MG tablet Take 81 mg by mouth daily.       . B  Complex-C (B-COMPLEX WITH VITAMIN C) tablet Take 1 tablet by mouth daily.      . clonazePAM (KLONOPIN) 0.5 MG tablet Take 0.5 mg by mouth 2 (two) times daily.      . cyanocobalamin (,VITAMIN B-12,) 1000 MCG/ML injection Inject 1,000 mcg into the muscle every 30 (thirty) days. Administered in Physician's office Last dose 11/30/2011      . Docusate Calcium (STOOL SOFTENER PO) Take 4 capsules by mouth at bedtime.       . fenofibrate 160 MG tablet Take 160 mg by mouth daily.       . furosemide (LASIX) 20 MG tablet 20 mg. 1-2 tabs po daily prn edema       . lamoTRIgine (LAMICTAL) 150 MG tablet Take 150 mg by mouth daily.       . metoprolol succinate (TOPROL-XL) 25 MG 24 hr tablet Take 1 tablet (25 mg total) by mouth daily.  30 tablet  3  . omega-3 acid ethyl esters (LOVAZA) 1 G capsule Take 2 g by mouth 2 (two) times daily.      . pantoprazole (PROTONIX) 40 MG tablet Take 1 tablet (40 mg total) by mouth daily.  30 tablet  3  . pravastatin (PRAVACHOL) 40 MG tablet Take 1 tablet (40 mg total) by mouth at bedtime.  30 tablet  1  . Probiotic Product (ALIGN) 4 MG CAPS Take 1 capsule by mouth daily.  30 capsule  0  . Propylene Glycol (SYSTANE BALANCE) 0.6 % SOLN Apply 1 drop to eye daily.      . QUEtiapine (SEROQUEL) 200 MG tablet Take 400 mg by mouth at bedtime. 2- 200mg  tablets at bedtime      . venlafaxine XR (EFFEXOR-XR) 150 MG 24 hr capsule Take 150 mg by mouth daily.      Marland Kitchen warfarin (COUMADIN) 5 MG tablet Take 5 mg by mouth daily. 1/2 to 1 tab: alternating days      . albuterol (PROVENTIL HFA;VENTOLIN HFA) 108 (90 BASE) MCG/ACT inhaler Inhale 2 puffs into the lungs every 6 (six) hours as needed for wheezing.  1 Inhaler  1  . Ferrous Fumarate-Folic Acid (HEMOCYTE-F) 324-1 MG TABS Take 1 tablet by mouth daily.  30 each  3  . nitroGLYCERIN (NITROSTAT) 0.4 MG SL tablet Place 1 tablet (0.4 mg total) under the tongue every 5 (five) minutes as needed for chest pain.  25 tablet  3  . DISCONTD: methimazole  (TAPAZOLE) 5 MG tablet Take 1 tablet (5 mg total) by mouth 3 (three) times daily.  30 tablet  2    Allergies  Allergen Reactions  .  Sulfonamide Derivatives     headaches    Review of Systems  Review of Systems  Constitutional: Positive for malaise/fatigue. Negative for fever.  HENT: Negative for congestion.   Eyes: Negative for discharge.  Respiratory: Negative for shortness of breath.   Cardiovascular: Negative for chest pain, palpitations and leg swelling.  Gastrointestinal: Negative for nausea, abdominal pain and diarrhea.  Genitourinary: Negative for dysuria.  Musculoskeletal: Negative for falls.  Skin: Negative for rash.  Neurological: Negative for loss of consciousness and headaches.  Endo/Heme/Allergies: Negative for polydipsia.  Psychiatric/Behavioral: Negative for depression and suicidal ideas. The patient is not nervous/anxious and does not have insomnia.     Objective  BP 125/65  Pulse 65  Temp 97.4 F (36.3 C) (Temporal)  Ht 5\' 6"  (1.676 m)  Wt 240 lb 1.9 oz (108.918 kg)  BMI 38.76 kg/m2  SpO2 95%  Physical Exam  Physical Exam  Constitutional: She is oriented to person, place, and time and well-developed, well-nourished, and in no distress. No distress.  HENT:  Head: Normocephalic and atraumatic.  Eyes: Conjunctivae are normal.  Neck: Neck supple. No thyromegaly present.  Cardiovascular: Normal rate, regular rhythm and normal heart sounds.   No murmur heard. Pulmonary/Chest: Effort normal and breath sounds normal. She has no wheezes.  Abdominal: She exhibits no distension and no mass.  Musculoskeletal: She exhibits no edema.  Lymphadenopathy:    She has no cervical adenopathy.  Neurological: She is alert and oriented to person, place, and time.  Skin: Skin is warm and dry. No rash noted. She is not diaphoretic.  Psychiatric: Memory, affect and judgment normal.    Lab Results  Component Value Date   TSH 2.09 10/27/2011   Lab Results  Component  Value Date   WBC 5.9 12/15/2011   HGB 10.6* 12/15/2011   HCT 32.3* 12/15/2011   MCV 91.2 12/15/2011   PLT 299.0 12/15/2011   Lab Results  Component Value Date   CREATININE 1.1 12/15/2011   BUN 24* 12/15/2011   NA 142 12/15/2011   K 4.0 12/15/2011   CL 105 12/15/2011   CO2 27 12/15/2011   Lab Results  Component Value Date   ALT 30 09/22/2011   AST 36 09/22/2011   ALKPHOS 35* 09/22/2011   BILITOT 0.6 09/22/2011   Lab Results  Component Value Date   CHOL 166 09/22/2011   Lab Results  Component Value Date   HDL 48.00 09/22/2011   No results found for this basename: LDLCALC   Lab Results  Component Value Date   TRIG 203.0* 09/22/2011   Lab Results  Component Value Date   CHOLHDL 3 09/22/2011     Assessment & Plan  Hematoma Right groin at site of cardiac angiogram. Is healing slowly but it still causes her a great deal of discomfort. Encourage ice and only mild activity the next week.   Chest pain Has just undergone a cardiac cath and is being followed by Dr Katrinka Blazing, has a follow up appt on 8/12  UTI (lower urinary tract infection) Resolved. No further treatment at this time  ANEMIA Slightly worse at this time secondary to recent hematoma. Encouraged increased iron and given Vitamin B12 shot today

## 2011-12-18 NOTE — Assessment & Plan Note (Signed)
Resolved. No further treatment at this time. 

## 2011-12-18 NOTE — Assessment & Plan Note (Signed)
Right groin at site of cardiac angiogram. Is healing slowly but it still causes her a great deal of discomfort. Encourage ice and only mild activity the next week.

## 2011-12-18 NOTE — Assessment & Plan Note (Signed)
Slightly worse at this time secondary to recent hematoma. Encouraged increased iron and given Vitamin B12 shot today

## 2011-12-26 ENCOUNTER — Ambulatory Visit (INDEPENDENT_AMBULATORY_CARE_PROVIDER_SITE_OTHER): Payer: PRIVATE HEALTH INSURANCE | Admitting: Family Medicine

## 2011-12-26 ENCOUNTER — Encounter: Payer: Self-pay | Admitting: Family Medicine

## 2011-12-26 VITALS — BP 113/70 | HR 57 | Temp 97.0°F | Ht 66.0 in | Wt 237.1 lb

## 2011-12-26 DIAGNOSIS — N289 Disorder of kidney and ureter, unspecified: Secondary | ICD-10-CM

## 2011-12-26 DIAGNOSIS — N390 Urinary tract infection, site not specified: Secondary | ICD-10-CM

## 2011-12-26 DIAGNOSIS — D649 Anemia, unspecified: Secondary | ICD-10-CM

## 2011-12-26 LAB — CBC
HCT: 33.7 % — ABNORMAL LOW (ref 36.0–46.0)
Hemoglobin: 11.3 g/dL — ABNORMAL LOW (ref 12.0–15.0)
MCHC: 33.5 g/dL (ref 30.0–36.0)
MCV: 91.6 fl (ref 78.0–100.0)
Platelets: 234 10*3/uL (ref 150.0–400.0)
RBC: 3.68 Mil/uL — ABNORMAL LOW (ref 3.87–5.11)
RDW: 14.4 % (ref 11.5–14.6)
WBC: 4.8 10*3/uL (ref 4.5–10.5)

## 2011-12-26 LAB — POCT URINALYSIS DIPSTICK
Bilirubin, UA: NEGATIVE
Glucose, UA: NEGATIVE
Ketones, UA: NEGATIVE
Nitrite, UA: NEGATIVE
Protein, UA: NEGATIVE
Spec Grav, UA: 1.02
Urobilinogen, UA: 0.2
pH, UA: 6.5

## 2011-12-26 MED ORDER — NITROFURANTOIN MONOHYD MACRO 100 MG PO CAPS: 100.0000 mg | ORAL_CAPSULE | Freq: Two times a day (BID) | ORAL | Status: AC

## 2011-12-26 MED ORDER — CIPROFLOXACIN HCL 500 MG PO TABS: 500.0000 mg | ORAL_TABLET | Freq: Two times a day (BID) | ORAL | Status: AC

## 2011-12-26 MED ORDER — FERROUS FUMARATE-FOLIC ACID 324-1 MG PO TABS
1.0000 | ORAL_TABLET | Freq: Every day | ORAL | Status: DC
Start: 2011-12-26 — End: 2013-02-17

## 2011-12-26 NOTE — Progress Notes (Signed)
Patient ID: [name], female   DOB: 01/05/1951, 61 y.o.   MRN: [id number] [name] [id number] 04-14-1951 12/26/2011      Progress Note-Follow Up  Subjective  Chief Complaint  Chief Complaint  Patient presents with  . Urinary Tract Infection    X 2 days- had urine frequency yesterday- today ok though    HPI  Patient is a 61 year old Caucasian female who is in today complaining of couple days of worsening urinary frequency urgency but also hesitancy. Has some suprapubic discomfort but denies fevers and chills. Denies fatigue malaise myalgias no hematuria or dysuria. Slight increase in low back discomfort is noted. She is also noting her stools are within normal limits still only occur roughly every other day. No other GI complaints. No chest pain, palpitations, shortness of breath  Past Medical History  Diagnosis Date  . Anemia   . Anxiety   . Depression   . Diverticulosis   . Elevated cholesterol   . Obesity   . THYROMEGALY 07/22/2010  . Overweight 03/11/2010  . Mixed hyperlipidemia 03/11/2010  . MEASLES, HX OF 03/11/2010  . KNEE PAIN, RIGHT 05/28/2010  . HYPERTRIGLYCERIDEMIA 03/11/2010  . HEART MURMUR, HX OF 03/11/2010  . FIBROIDS, UTERUS 03/11/2010  . Diarrhea 08/05/2010  . CONSTIPATION 03/11/2010  . CHICKENPOX, HX OF 03/11/2010  . BIPOLAR AFFECTIVE DISORDER 03/11/2010  . AORTIC VALVE REPLACEMENT, HX OF 03/11/2010  . ANEMIA 05/28/2010  . Acute bronchitis 07/22/2010  . Abdominal pain, generalized 03/19/2010  . Peripheral edema 11/18/2010  . Bacterial vaginitis 11/18/2010  . Hematuria 12/18/2010  . Diverticulitis 12/18/2010  . Hyperthyroidism 12/18/2010  . Fatigue 12/30/2010  . Renal insufficiency 12/30/2010  . Grave's disease 8-12  . Arm lesion 03/23/2011  . UTI (lower urinary tract infection) 08/14/2011  . Breast pain in female 11/17/2011  . Chest pain 11/17/2011  . Hematoma 12/18/2011    Past Surgical History  Procedure Date  . Appendectomy     Required revision for  EColi infection, required recurrent packing  . Bowel obstruction     Requiring adhesions to be removed  . Abdominal hysterectomy     sb/l spo, total  . Aortic valve replacement   . Open heart with a cardiac aneurysm repair   . Cholecystectomy   . Tubal ligation   . Varicose vein surgery b/l legs   . Childhood exploratory surgery of heart   . Hernia repair 08-16-10    Family History  Problem Relation Age of Onset  . Scoliosis Mother   . Arthritis Mother     Rheumatoid  . Aneurysm Mother     heart  . Alcohol abuse Father   . Depression Sister   . Depression Brother   . Alcohol abuse Brother   . Cancer Maternal Grandmother     colon  . Diabetes Maternal Grandfather   . Heart disease Maternal Grandfather   . Alcohol abuse Maternal Grandfather   . Depression Paternal Grandmother   . Diabetes Paternal Grandmother   . Depression Paternal Grandfather   . Diabetes Paternal Grandfather   . Depression Sister   . Alcohol abuse Brother   . Depression Brother   . Heart disease Brother     History   Social History  . Marital Status: Married    Spouse Name: N/A    Number of Children: N/A  . Years of Education: N/A   Occupational History  . Not on file.   Social History Main Topics  . Smoking status: Former  Smoker    Quit date: 05/26/1990  . Smokeless tobacco: Never Used  . Alcohol Use: 0.0 oz/week    0 drink(s) per week     very serious occasions  . Drug Use: No  . Sexually Active: No   Other Topics Concern  . Not on file   Social History Narrative  . No narrative on file    Current Outpatient Prescriptions on File Prior to Visit  Medication Sig Dispense Refill  . acetaminophen (TYLENOL) 325 MG tablet Take 650 mg by mouth every 6 (six) hours as needed.       Marland Kitchen acetaminophen-codeine (TYLENOL #3) 300-30 MG per tablet Take 1 tablet by mouth every 6 (six) hours as needed. Cough/pain      . albuterol (PROVENTIL HFA;VENTOLIN HFA) 108 (90 BASE) MCG/ACT inhaler Inhale 2  puffs into the lungs every 6 (six) hours as needed for wheezing.  1 Inhaler  1  . aspirin 81 MG tablet Take 81 mg by mouth daily.       . B Complex-C (B-COMPLEX WITH VITAMIN C) tablet Take 1 tablet by mouth daily.      . clonazePAM (KLONOPIN) 0.5 MG tablet Take 0.5 mg by mouth 2 (two) times daily.      . cyanocobalamin (,VITAMIN B-12,) 1000 MCG/ML injection Inject 1,000 mcg into the muscle every 30 (thirty) days. Administered in Physician's office Last dose 11/30/2011      . Docusate Calcium (STOOL SOFTENER PO) Take 4 capsules by mouth at bedtime.       . fenofibrate 160 MG tablet Take 160 mg by mouth daily.       . furosemide (LASIX) 20 MG tablet 20 mg. 1-2 tabs po daily prn edema       . lamoTRIgine (LAMICTAL) 150 MG tablet Take 150 mg by mouth daily.       . metoprolol succinate (TOPROL-XL) 25 MG 24 hr tablet Take 1 tablet (25 mg total) by mouth daily.  30 tablet  3  . nitroGLYCERIN (NITROSTAT) 0.4 MG SL tablet Place 1 tablet (0.4 mg total) under the tongue every 5 (five) minutes as needed for chest pain.  25 tablet  3  . omega-3 acid ethyl esters (LOVAZA) 1 G capsule Take 2 g by mouth 2 (two) times daily.      . pantoprazole (PROTONIX) 40 MG tablet Take 1 tablet (40 mg total) by mouth daily.  30 tablet  3  . pravastatin (PRAVACHOL) 40 MG tablet Take 1 tablet (40 mg total) by mouth at bedtime.  30 tablet  1  . Probiotic Product (ALIGN) 4 MG CAPS Take 1 capsule by mouth daily.  30 capsule  0  . Propylene Glycol (SYSTANE BALANCE) 0.6 % SOLN Apply 1 drop to eye daily.      . QUEtiapine (SEROQUEL) 200 MG tablet Take 400 mg by mouth at bedtime. 2- 200mg  tablets at bedtime      . venlafaxine XR (EFFEXOR-XR) 150 MG 24 hr capsule Take 150 mg by mouth daily.      Marland Kitchen warfarin (COUMADIN) 5 MG tablet Take 5 mg by mouth daily. 1/2 to 1 tab: alternating days      . DISCONTD: methimazole (TAPAZOLE) 5 MG tablet Take 1 tablet (5 mg total) by mouth 3 (three) times daily.  30 tablet  2    Allergies  Allergen  Reactions  . Sulfonamide Derivatives     headaches    Review of Systems  Review of Systems  Constitutional: Negative for fever  and malaise/fatigue.  HENT: Negative for congestion.   Eyes: Negative for discharge.  Respiratory: Negative for sputum production and shortness of breath.   Cardiovascular: Negative for chest pain, palpitations and leg swelling.  Gastrointestinal: Positive for abdominal pain and melena. Negative for heartburn, nausea, diarrhea and blood in stool.       Dark stool normal form every other day this week, suprapubic pressure  Genitourinary: Positive for urgency and frequency. Negative for dysuria and hematuria.  Musculoskeletal: Negative for falls.  Skin: Negative for rash.  Neurological: Negative for loss of consciousness and headaches.  Endo/Heme/Allergies: Negative for polydipsia.  Psychiatric/Behavioral: Negative for depression and suicidal ideas. The patient is not nervous/anxious and does not have insomnia.     Objective  BP 113/70  Pulse 57  Temp 97 F (36.1 C) (Temporal)  Ht 5\' 6"  (1.676 m)  Wt 237 lb 1.9 oz (107.557 kg)  BMI 38.27 kg/m2  SpO2 95%  Physical Exam  Physical Exam  Constitutional: She is oriented to person, place, and time and well-developed, well-nourished, and in no distress. No distress.  HENT:  Head: Normocephalic and atraumatic.  Eyes: Conjunctivae are normal.  Neck: Neck supple. No thyromegaly present.  Cardiovascular: Normal rate, regular rhythm and normal heart sounds.   No murmur heard. Pulmonary/Chest: Effort normal and breath sounds normal. She has no wheezes.  Abdominal: She exhibits no distension and no mass.  Genitourinary: Guaiac negative stool.       Rectal exam, good tone, no hemorrhoids or fissures noted  Musculoskeletal: She exhibits no edema.  Lymphadenopathy:    She has no cervical adenopathy.  Neurological: She is alert and oriented to person, place, and time.  Skin: Skin is warm and dry. No rash  noted. She is not diaphoretic.  Psychiatric: Memory, affect and judgment normal.    Lab Results  Component Value Date   TSH 2.09 10/27/2011   Lab Results  Component Value Date   WBC 5.9 12/15/2011   HGB 10.6* 12/15/2011   HCT 32.3* 12/15/2011   MCV 91.2 12/15/2011   PLT 299.0 12/15/2011   Lab Results  Component Value Date   CREATININE 1.1 12/15/2011   BUN 24* 12/15/2011   NA 142 12/15/2011   K 4.0 12/15/2011   CL 105 12/15/2011   CO2 27 12/15/2011   Lab Results  Component Value Date   ALT 30 09/22/2011   AST 36 09/22/2011   ALKPHOS 35* 09/22/2011   BILITOT 0.6 09/22/2011   Lab Results  Component Value Date   CHOL 166 09/22/2011   Lab Results  Component Value Date   HDL 48.00 09/22/2011   No results found for this basename: [facility]   Lab Results  Component Value Date   TRIG 203.0* 09/22/2011   Lab Results  Component Value Date   CHOLHDL 3 09/22/2011     Assessment & Plan  Recurrent UTI Urine is suspicious. We'll send for culture. Patient symptomatic. Will treat with ciprofloxacin 500 mg twice a day for 7 days and then we'll start him on a maintenance course of nitrofurantoin 100 mg twice a day. If after a month she's not had a recurrent UTIs who dropped to once daily. In the meantime due to the increasing frequency with which she is having these infections we will refer to urology for further evaluation.  ANEMIA Worse again and patient has stopped her Hemocyte F. We will restart that. Rectal exam today was Hemoccult negative. This was performed because the patient was complaining of dark  stool roughly every other day. Repeat CBC today.  Renal insufficiency Recent BUN 14, creatinine 1.1

## 2011-12-26 NOTE — Assessment & Plan Note (Signed)
Recent BUN 14, creatinine 1.1

## 2011-12-26 NOTE — Assessment & Plan Note (Signed)
Urine is suspicious. We'll send for culture. Patient symptomatic. Will treat with ciprofloxacin 500 mg twice a day for 7 days and then we'll start him on a maintenance course of nitrofurantoin 100 mg twice a day. If after a month she's not had a recurrent UTIs who dropped to once daily. In the meantime due to the increasing frequency with which she is having these infections we will refer to urology for further evaluation.

## 2011-12-26 NOTE — Assessment & Plan Note (Signed)
Worse again and patient has stopped her Hemocyte F. We will restart that. Rectal exam today was Hemoccult negative. This was performed because the patient was complaining of dark stool roughly every other day. Repeat CBC today.

## 2011-12-26 NOTE — Progress Notes (Signed)
Quick Note:  Patient Informed and voiced understanding ______ 

## 2012-01-14 ENCOUNTER — Ambulatory Visit: Payer: PRIVATE HEALTH INSURANCE | Admitting: Family Medicine

## 2012-01-21 ENCOUNTER — Other Ambulatory Visit: Payer: Self-pay

## 2012-01-21 ENCOUNTER — Ambulatory Visit: Payer: PRIVATE HEALTH INSURANCE | Admitting: Family Medicine

## 2012-01-21 ENCOUNTER — Encounter: Payer: Self-pay | Admitting: Family Medicine

## 2012-01-21 ENCOUNTER — Telehealth: Payer: Self-pay

## 2012-01-21 ENCOUNTER — Ambulatory Visit (INDEPENDENT_AMBULATORY_CARE_PROVIDER_SITE_OTHER): Payer: PRIVATE HEALTH INSURANCE | Admitting: Family Medicine

## 2012-01-21 VITALS — BP 126/67 | HR 66 | Temp 97.1°F | Ht 66.0 in | Wt 238.4 lb

## 2012-01-21 DIAGNOSIS — F319 Bipolar disorder, unspecified: Secondary | ICD-10-CM

## 2012-01-21 DIAGNOSIS — N390 Urinary tract infection, site not specified: Secondary | ICD-10-CM

## 2012-01-21 DIAGNOSIS — E781 Pure hyperglyceridemia: Secondary | ICD-10-CM

## 2012-01-21 DIAGNOSIS — E538 Deficiency of other specified B group vitamins: Secondary | ICD-10-CM

## 2012-01-21 DIAGNOSIS — D649 Anemia, unspecified: Secondary | ICD-10-CM

## 2012-01-21 DIAGNOSIS — Z954 Presence of other heart-valve replacement: Secondary | ICD-10-CM

## 2012-01-21 MED ORDER — CYANOCOBALAMIN 1000 MCG/ML IJ SOLN
1000.0000 ug | Freq: Once | INTRAMUSCULAR | Status: AC
  Administered 2012-01-21: 1000 ug via INTRAMUSCULAR

## 2012-01-21 MED ORDER — PANTOPRAZOLE SODIUM 40 MG PO TBEC: 40.0000 mg | DELAYED_RELEASE_TABLET | Freq: Every day | ORAL | Status: DC

## 2012-01-21 NOTE — Assessment & Plan Note (Signed)
Check lipid panel prior to next visit 

## 2012-01-21 NOTE — Patient Instructions (Addendum)
Call your insurance and find out if they pay for the Zostavax/Shingles vaccine and if it is better for you to do it in our office or at a pharmacy

## 2012-01-21 NOTE — Assessment & Plan Note (Signed)
Follows with Dr Verdis Prime. No change in meds

## 2012-01-21 NOTE — Telephone Encounter (Signed)
Pt advised and voiced agreement

## 2012-01-21 NOTE — Telephone Encounter (Signed)
Patient states she got a Regulatory affairs officer. Patient needs a letter from the MD stating she qualifies for disability before she can schedule an appt with the lawyer. Please advise?

## 2012-01-21 NOTE — Telephone Encounter (Signed)
I do not have a set template for a letter but can put together a letter about her various conditions. It will not be done til the beginning of the week most likely

## 2012-01-21 NOTE — Assessment & Plan Note (Signed)
Follows with psychiatry, is struggling with a tough marriage but feels her medications are helping

## 2012-01-21 NOTE — Progress Notes (Signed)
Patient ID: [name], female   DOB: 08-Apr-1951, 61 y.o.   MRN: [id number] VEGA STARE [id number] [birth year] 01/21/2012      Progress Note-Follow Up  Subjective  Chief Complaint  Chief Complaint  Patient presents with  . Follow-up    1 month  . Injections    b12    HPI  Patient is a 61 year old Caucasian female who is in today for followup. Overall she is feeling somewhat better. She's recently undergone a cardiac cath secondary to some chest pain which fortunately was clear. She continues to struggle with residual hematoma in her right groin but it is slowly improving. She says she's not having any urinary symptoms. No dysuria, hematuria or frequency. She continues to struggle with fatigue and stress. She is following with psychiatry and is struggling with the top marriage but overall feels her medications are helping her. She's not or palpitations she does continue to have some mild shortness or breath with exertion but it is not worsening. She's recently been seen by her cardiologist and will be seen shortly by her endocrinologist. No fevers, chills, congestion or new complaints are noted today Past Medical History  Diagnosis Date  . Anemia   . Anxiety   . Depression   . Diverticulosis   . Elevated cholesterol   . Obesity   . THYROMEGALY 07/22/2010  . Overweight 03/11/2010  . Mixed hyperlipidemia 03/11/2010  . MEASLES, HX OF 03/11/2010  . KNEE PAIN, RIGHT 05/28/2010  . HYPERTRIGLYCERIDEMIA 03/11/2010  . HEART MURMUR, HX OF 03/11/2010  . FIBROIDS, UTERUS 03/11/2010  . Diarrhea 08/05/2010  . CONSTIPATION 03/11/2010  . CHICKENPOX, HX OF 03/11/2010  . BIPOLAR AFFECTIVE DISORDER 03/11/2010  . AORTIC VALVE REPLACEMENT, HX OF 03/11/2010  . ANEMIA 05/28/2010  . Acute bronchitis 07/22/2010  . Abdominal pain, generalized 03/19/2010  . Peripheral edema 11/18/2010  . Bacterial vaginitis 11/18/2010  . Hematuria 12/18/2010  . Diverticulitis 12/18/2010  . Hyperthyroidism 12/18/2010  .  Fatigue 12/30/2010  . Renal insufficiency 12/30/2010  . Grave's disease 8-12  . Arm lesion 03/23/2011  . UTI (lower urinary tract infection) 08/14/2011  . Breast pain in female 11/17/2011  . Chest pain 11/17/2011  . Hematoma 12/18/2011    Past Surgical History  Procedure Date  . Appendectomy     Required revision for EColi infection, required recurrent packing  . Bowel obstruction     Requiring adhesions to be removed  . Abdominal hysterectomy     sb/l spo, total  . Aortic valve replacement   . Open heart with a cardiac aneurysm repair   . Cholecystectomy   . Tubal ligation   . Varicose vein surgery b/l legs   . Childhood exploratory surgery of heart   . Hernia repair 08-16-10    Family History  Problem Relation Age of Onset  . Scoliosis Mother   . Arthritis Mother     Rheumatoid  . Aneurysm Mother     heart  . Alcohol abuse Father   . Depression Sister   . Depression Brother   . Alcohol abuse Brother   . Cancer Maternal Grandmother     colon  . Diabetes Maternal Grandfather   . Heart disease Maternal Grandfather   . Alcohol abuse Maternal Grandfather   . Depression Paternal Grandmother   . Diabetes Paternal Grandmother   . Depression Paternal Grandfather   . Diabetes Paternal Grandfather   . Depression Sister   . Alcohol abuse Brother   . Depression Brother   .  Heart disease Brother     History   Social History  . Marital Status: Married    Spouse Name: N/A    Number of Children: N/A  . Years of Education: N/A   Occupational History  . Not on file.   Social History Main Topics  . Smoking status: Former Smoker    Quit date: 05/26/1990  . Smokeless tobacco: Never Used  . Alcohol Use: 0.0 oz/week    0 drink(s) per week     very serious occasions  . Drug Use: No  . Sexually Active: No   Other Topics Concern  . Not on file   Social History Narrative  . No narrative on file    Current Outpatient Prescriptions on File Prior to Visit  Medication Sig  Dispense Refill  . acetaminophen-codeine (TYLENOL #3) 300-30 MG per tablet Take 1 tablet by mouth every 6 (six) hours as needed. Cough/pain      . aspirin 81 MG tablet Take 81 mg by mouth daily.       . B Complex-C (B-COMPLEX WITH VITAMIN C) tablet Take 1 tablet by mouth daily.      . clonazePAM (KLONOPIN) 0.5 MG tablet Take 0.5 mg by mouth 2 (two) times daily.      . cyanocobalamin (,VITAMIN B-12,) 1000 MCG/ML injection Inject 1,000 mcg into the muscle every 30 (thirty) days. Administered in Physician's office Last dose 11/30/2011      . Docusate Calcium (STOOL SOFTENER PO) Take 4 capsules by mouth at bedtime.       . fenofibrate 160 MG tablet Take 160 mg by mouth daily.       . Ferrous Fumarate-Folic Acid (HEMOCYTE-F) 324-1 MG TABS Take 1 tablet by mouth daily.  90 each  1  . furosemide (LASIX) 20 MG tablet 20 mg. 1-2 tabs po daily prn edema       . lamoTRIgine (LAMICTAL) 150 MG tablet Take 150 mg by mouth daily.       . metoprolol succinate (TOPROL-XL) 25 MG 24 hr tablet Take 1 tablet (25 mg total) by mouth daily.  30 tablet  3  . omega-3 acid ethyl esters (LOVAZA) 1 G capsule Take 2 g by mouth 2 (two) times daily.      . pravastatin (PRAVACHOL) 40 MG tablet Take 1 tablet (40 mg total) by mouth at bedtime.  30 tablet  1  . Probiotic Product (ALIGN) 4 MG CAPS Take 1 capsule by mouth daily.  30 capsule  0  . Propylene Glycol (SYSTANE BALANCE) 0.6 % SOLN Apply 1 drop to eye daily.      . QUEtiapine (SEROQUEL) 200 MG tablet Take 400 mg by mouth at bedtime. 2- 200mg  tablets at bedtime      . venlafaxine XR (EFFEXOR-XR) 150 MG 24 hr capsule Take 150 mg by mouth daily.      Marland Kitchen warfarin (COUMADIN) 5 MG tablet Take 5 mg by mouth daily. 1/2 to 1 tab: alternating days      . acetaminophen (TYLENOL) 325 MG tablet Take 650 mg by mouth every 6 (six) hours as needed.       Marland Kitchen albuterol (PROVENTIL HFA;VENTOLIN HFA) 108 (90 BASE) MCG/ACT inhaler Inhale 2 puffs into the lungs every 6 (six) hours as needed for  wheezing.  1 Inhaler  1  . nitroGLYCERIN (NITROSTAT) 0.4 MG SL tablet Place 1 tablet (0.4 mg total) under the tongue every 5 (five) minutes as needed for chest pain.  25 tablet  3  .  DISCONTD: methimazole (TAPAZOLE) 5 MG tablet Take 1 tablet (5 mg total) by mouth 3 (three) times daily.  30 tablet  2  . DISCONTD: pantoprazole (PROTONIX) 40 MG tablet Take 1 tablet (40 mg total) by mouth daily.  30 tablet  3   No current facility-administered medications on file prior to visit.    Allergies  Allergen Reactions  . Sulfonamide Derivatives     headaches    Review of Systems  Review of Systems  Constitutional: Positive for malaise/fatigue. Negative for fever.  HENT: Negative for congestion.   Eyes: Negative for discharge.  Respiratory: Negative for shortness of breath.   Cardiovascular: Negative for chest pain, palpitations and leg swelling.  Gastrointestinal: Negative for nausea, abdominal pain and diarrhea.  Genitourinary: Negative for dysuria.  Musculoskeletal: Negative for falls.  Skin: Negative for rash.  Neurological: Negative for loss of consciousness and headaches.  Endo/Heme/Allergies: Negative for polydipsia.  Psychiatric/Behavioral: Negative for depression and suicidal ideas. The patient is nervous/anxious. The patient does not have insomnia.     Objective  BP 126/67  Pulse 66  Temp 97.1 F (36.2 C) (Temporal)  Ht 5\' 6"  (1.676 m)  Wt 238 lb 6.4 oz (108.138 kg)  BMI 38.48 kg/m2  SpO2 94%  Physical Exam  Physical Exam  Constitutional: She is oriented to person, place, and time and well-developed, well-nourished, and in no distress. No distress.  HENT:  Head: Normocephalic and atraumatic.  Eyes: Conjunctivae are normal.  Neck: Neck supple. No thyromegaly present.  Cardiovascular: Normal rate and regular rhythm.   Murmur heard.      Systolic click  Pulmonary/Chest: Effort normal and breath sounds normal. She has no wheezes.  Abdominal: She exhibits no distension  and no mass.  Musculoskeletal: She exhibits no edema.  Lymphadenopathy:    She has no cervical adenopathy.  Neurological: She is alert and oriented to person, place, and time.  Skin: Skin is warm and dry. No rash noted. She is not diaphoretic.  Psychiatric: Memory, affect and judgment normal.    Lab Results  Component Value Date   TSH 2.09 10/27/2011   Lab Results  Component Value Date   WBC 4.8 12/26/2011   HGB 11.3* 12/26/2011   HCT 33.7* 12/26/2011   MCV 91.6 12/26/2011   PLT 234.0 12/26/2011   Lab Results  Component Value Date   CREATININE 1.1 12/15/2011   BUN 24* 12/15/2011   NA 142 12/15/2011   K 4.0 12/15/2011   CL 105 12/15/2011   CO2 27 12/15/2011   Lab Results  Component Value Date   ALT 30 09/22/2011   AST 36 09/22/2011   ALKPHOS 35* 09/22/2011   BILITOT 0.6 09/22/2011   Lab Results  Component Value Date   CHOL 166 09/22/2011   Lab Results  Component Value Date   HDL 48.00 09/22/2011   No results found for this basename: [facility]   Lab Results  Component Value Date   TRIG 203.0* 09/22/2011   Lab Results  Component Value Date   CHOLHDL 3 09/22/2011     Assessment & Plan  Recurrent UTI Asymptomatic, repeat UA at next visit or as needed.  HYPERTRIGLYCERIDEMIA Check lipid panel prior to next visit.  BIPOLAR AFFECTIVE DISORDER Follows with psychiatry, is struggling with a tough marriage but feels her medications are helping  AORTIC VALVE REPLACEMENT, HX OF Follows with Dr Verdis Prime. No change in meds  ANEMIA Given vitamin B12 shot today, repeat CBC at next visit

## 2012-01-21 NOTE — Assessment & Plan Note (Addendum)
Asymptomatic, repeat UA at next visit or as needed.

## 2012-01-21 NOTE — Assessment & Plan Note (Signed)
Given vitamin B12 shot today, repeat CBC at next visit

## 2012-02-09 ENCOUNTER — Other Ambulatory Visit: Payer: Self-pay

## 2012-02-09 MED ORDER — FUROSEMIDE 20 MG PO TABS: 20.0000 mg | ORAL_TABLET | ORAL | Status: DC

## 2012-02-18 ENCOUNTER — Encounter: Payer: Self-pay | Admitting: Endocrinology

## 2012-02-18 ENCOUNTER — Other Ambulatory Visit (INDEPENDENT_AMBULATORY_CARE_PROVIDER_SITE_OTHER): Payer: PRIVATE HEALTH INSURANCE

## 2012-02-18 ENCOUNTER — Encounter: Payer: Self-pay | Admitting: Family Medicine

## 2012-02-18 ENCOUNTER — Ambulatory Visit (INDEPENDENT_AMBULATORY_CARE_PROVIDER_SITE_OTHER): Payer: PRIVATE HEALTH INSURANCE | Admitting: Endocrinology

## 2012-02-18 VITALS — BP 118/80 | HR 67 | Temp 97.8°F | Wt 248.0 lb

## 2012-02-18 DIAGNOSIS — E0590 Thyrotoxicosis, unspecified without thyrotoxic crisis or storm: Secondary | ICD-10-CM

## 2012-02-18 LAB — T4, FREE: Free T4: 0.74 ng/dL (ref 0.60–1.60)

## 2012-02-18 LAB — TSH: TSH: 3.15 u[IU]/mL (ref 0.35–5.50)

## 2012-02-18 NOTE — Progress Notes (Signed)
Subjective:    Patient ID: [name], female    DOB: 1950/09/05, 61 y.o.   MRN: [id number]  HPI Pt returns for f/u of hyperthyroidism (dx'ed 2012; in the setting of recent amiodarone rx (which she had to d/c, due to renal insuff)).  Tapazole was stopped 6 mos ago, with plans for i-131 rx, when tsh becomes abnormal.  pt states she feels well in general, except for tremor. Past Medical History  Diagnosis Date  . Anemia   . Anxiety   . Depression   . Diverticulosis   . Elevated cholesterol   . Obesity   . THYROMEGALY 07/22/2010  . Overweight 03/11/2010  . Mixed hyperlipidemia 03/11/2010  . MEASLES, HX OF 03/11/2010  . KNEE PAIN, RIGHT 05/28/2010  . HYPERTRIGLYCERIDEMIA 03/11/2010  . HEART MURMUR, HX OF 03/11/2010  . FIBROIDS, UTERUS 03/11/2010  . Diarrhea 08/05/2010  . CONSTIPATION 03/11/2010  . CHICKENPOX, HX OF 03/11/2010  . BIPOLAR AFFECTIVE DISORDER 03/11/2010  . AORTIC VALVE REPLACEMENT, HX OF 03/11/2010  . ANEMIA 05/28/2010  . Acute bronchitis 07/22/2010  . Abdominal pain, generalized 03/19/2010  . Peripheral edema 11/18/2010  . Bacterial vaginitis 11/18/2010  . Hematuria 12/18/2010  . Diverticulitis 12/18/2010  . Hyperthyroidism 12/18/2010  . Fatigue 12/30/2010  . Renal insufficiency 12/30/2010  . Grave's disease 8-12  . Arm lesion 03/23/2011  . UTI (lower urinary tract infection) 08/14/2011  . Breast pain in female 11/17/2011  . Chest pain 11/17/2011  . Hematoma 12/18/2011    Past Surgical History  Procedure Date  . Appendectomy     Required revision for EColi infection, required recurrent packing  . Bowel obstruction     Requiring adhesions to be removed  . Abdominal hysterectomy     sb/l spo, total  . Aortic valve replacement   . Open heart with a cardiac aneurysm repair   . Cholecystectomy   . Tubal ligation   . Varicose vein surgery b/l legs   . Childhood exploratory surgery of heart   . Hernia repair 08-16-10    History   Social History  . Marital Status:  Married    Spouse Name: N/A    Number of Children: N/A  . Years of Education: N/A   Occupational History  . Not on file.   Social History Main Topics  . Smoking status: Former Smoker    Quit date: 05/26/1990  . Smokeless tobacco: Never Used  . Alcohol Use: 0.0 oz/week    0 drink(s) per week     very serious occasions  . Drug Use: No  . Sexually Active: No   Other Topics Concern  . Not on file   Social History Narrative  . No narrative on file    Current Outpatient Prescriptions on File Prior to Visit  Medication Sig Dispense Refill  . acetaminophen (TYLENOL) 325 MG tablet Take 650 mg by mouth every 6 (six) hours as needed.       Marland Kitchen acetaminophen-codeine (TYLENOL #3) 300-30 MG per tablet Take 1 tablet by mouth every 6 (six) hours as needed. Cough/pain      . albuterol (PROVENTIL HFA;VENTOLIN HFA) 108 (90 BASE) MCG/ACT inhaler Inhale 2 puffs into the lungs every 6 (six) hours as needed for wheezing.  1 Inhaler  1  . aspirin 81 MG tablet Take 81 mg by mouth daily.       . B Complex-C (B-COMPLEX WITH VITAMIN C) tablet Take 1 tablet by mouth daily.      . clonazePAM (KLONOPIN) 0.5  MG tablet Take 0.5 mg by mouth 2 (two) times daily.      . cyanocobalamin (,VITAMIN B-12,) 1000 MCG/ML injection Inject 1,000 mcg into the muscle every 30 (thirty) days. Administered in Physician's office Last dose 11/30/2011      . Docusate Calcium (STOOL SOFTENER PO) Take 4 capsules by mouth at bedtime.       . fenofibrate 160 MG tablet Take 160 mg by mouth daily.       . Ferrous Fumarate-Folic Acid (HEMOCYTE-F) 324-1 MG TABS Take 1 tablet by mouth daily.  90 each  1  . furosemide (LASIX) 20 MG tablet Take 1 tablet (20 mg total) by mouth as directed. 1-2 tabs po daily prn edema  30 tablet  3  . lamoTRIgine (LAMICTAL) 150 MG tablet Take 150 mg by mouth daily.       . metoprolol succinate (TOPROL-XL) 25 MG 24 hr tablet Take 1 tablet (25 mg total) by mouth daily.  30 tablet  3  . nitroGLYCERIN (NITROSTAT)  0.4 MG SL tablet Place 1 tablet (0.4 mg total) under the tongue every 5 (five) minutes as needed for chest pain.  25 tablet  3  . omega-3 acid ethyl esters (LOVAZA) 1 G capsule Take 2 g by mouth 2 (two) times daily.      . pantoprazole (PROTONIX) 40 MG tablet Take 1 tablet (40 mg total) by mouth daily.  30 tablet  8  . pravastatin (PRAVACHOL) 40 MG tablet Take 1 tablet (40 mg total) by mouth at bedtime.  30 tablet  1  . Probiotic Product (ALIGN) 4 MG CAPS Take 1 capsule by mouth daily.  30 capsule  0  . Propylene Glycol (SYSTANE BALANCE) 0.6 % SOLN Apply 1 drop to eye daily.      . QUEtiapine (SEROQUEL) 200 MG tablet Take 400 mg by mouth at bedtime. 2- 200mg  tablets at bedtime      . venlafaxine XR (EFFEXOR-XR) 150 MG 24 hr capsule Take 150 mg by mouth daily.      Marland Kitchen warfarin (COUMADIN) 5 MG tablet Take 5 mg by mouth daily. 1/2 to 1 tab: alternating days      . DISCONTD: methimazole (TAPAZOLE) 5 MG tablet Take 1 tablet (5 mg total) by mouth 3 (three) times daily.  30 tablet  2   No current facility-administered medications on file prior to visit.    Allergies  Allergen Reactions  . Sulfonamide Derivatives     headaches    Family History  Problem Relation Age of Onset  . Scoliosis Mother   . Arthritis Mother     Rheumatoid  . Aneurysm Mother     heart  . Alcohol abuse Father   . Depression Sister   . Depression Brother   . Alcohol abuse Brother   . Cancer Maternal Grandmother     colon  . Diabetes Maternal Grandfather   . Heart disease Maternal Grandfather   . Alcohol abuse Maternal Grandfather   . Depression Paternal Grandmother   . Diabetes Paternal Grandmother   . Depression Paternal Grandfather   . Diabetes Paternal Grandfather   . Depression Sister   . Alcohol abuse Brother   . Depression Brother   . Heart disease Brother     BP 118/80  Pulse 67  Temp 97.8 F (36.6 C) (Oral)  Wt 248 lb (112.492 kg)  SpO2 98%  Review of Systems Denies fever    Objective:    Physical Exam VITAL SIGNS:  See vs  page GENERAL: no distress NECK: There is no palpable thyroid enlargement.  No thyroid nodule is palpable.  No palpable lymphadenopathy at the anterior neck.  Lab Results  Component Value Date   TSH 3.15 02/18/2012      Assessment & Plan:  Hyperthyroidism is well-controlled

## 2012-02-18 NOTE — Patient Instructions (Addendum)
blood tests are being requested for you today.  You will be contacted with results.  When the thyroid goes overactive again: We would check a thyroid "scan" (a special, but easy and painless type of thyroid x ray).  It works like this: you go to the x-ray department of the hospital to swallow a pill, which contains a miniscule amount of radiation.  You will not notice any symptoms from this.  You will go back to the x-ray department the next day, to lie down in front of a camera.  The results of this will be sent to me.  please call [phone] to hear your test results.  You will be prompted to enter the 9-digit "MRN" number that appears at the top left of this page, followed by #.  Then you will hear the message. Based on the results, i hope to order for you a treatment pill of radioactive iodine.  Although it is a larger amount of radiation, you will again notice no symptoms from this.  The pill is gone from your body in a few days (during which you should stay away from other people), but takes several months to work.  Therefore, please return here approximately 6-8 weeks after the treatment.  This treatment has been available for many years, and the only known side-effect is an underactive thyroid.  It is possible that i would eventually prescribe for you a thyroid hormone pill, which is very inexpensive.  You don't have to worry about side-effects of this thyroid hormone pill, because it is the same molecule your thyroid makes.

## 2012-02-20 ENCOUNTER — Ambulatory Visit (INDEPENDENT_AMBULATORY_CARE_PROVIDER_SITE_OTHER): Payer: PRIVATE HEALTH INSURANCE

## 2012-02-20 ENCOUNTER — Ambulatory Visit: Payer: PRIVATE HEALTH INSURANCE

## 2012-02-20 DIAGNOSIS — Z23 Encounter for immunization: Secondary | ICD-10-CM

## 2012-02-20 DIAGNOSIS — E538 Deficiency of other specified B group vitamins: Secondary | ICD-10-CM

## 2012-02-20 MED ORDER — ZOSTER VACCINE LIVE 19400 UNT/0.65ML ~~LOC~~ SOLR: 0.6500 mL | Freq: Once | SUBCUTANEOUS | Status: DC

## 2012-02-20 MED ORDER — CYANOCOBALAMIN 1000 MCG/ML IJ SOLN
1000.0000 ug | Freq: Once | INTRAMUSCULAR | Status: AC
  Administered 2012-02-20: 1000 ug via INTRAMUSCULAR

## 2012-02-20 NOTE — Progress Notes (Signed)
  Subjective:    Patient ID: [name], female    DOB: Apr 22, 1951, 61 y.o.   MRN: [id number]  HPI    Review of Systems     Objective:   Physical Exam        Assessment & Plan:  Patient came in for her monthly B12 injection, flu shot, and an RX for the Zostavax injection. Patient tolerated both injections well

## 2012-03-08 ENCOUNTER — Other Ambulatory Visit: Payer: Self-pay

## 2012-03-08 DIAGNOSIS — E0590 Thyrotoxicosis, unspecified without thyrotoxic crisis or storm: Secondary | ICD-10-CM

## 2012-03-08 MED ORDER — METOPROLOL SUCCINATE ER 25 MG PO TB24: 25.0000 mg | ORAL_TABLET | Freq: Every day | ORAL | Status: DC

## 2012-03-15 ENCOUNTER — Other Ambulatory Visit: Payer: PRIVATE HEALTH INSURANCE

## 2012-03-22 ENCOUNTER — Encounter: Payer: Self-pay | Admitting: Family Medicine

## 2012-03-22 ENCOUNTER — Telehealth: Payer: Self-pay

## 2012-03-22 ENCOUNTER — Other Ambulatory Visit (HOSPITAL_COMMUNITY)
Admission: RE | Admit: 2012-03-22 | Discharge: 2012-03-22 | Disposition: A | Discharge status: Home / Self Care | Payer: PRIVATE HEALTH INSURANCE | Source: Ambulatory Visit | Attending: Family Medicine | Admitting: Family Medicine

## 2012-03-22 ENCOUNTER — Ambulatory Visit (INDEPENDENT_AMBULATORY_CARE_PROVIDER_SITE_OTHER): Payer: PRIVATE HEALTH INSURANCE | Admitting: Family Medicine

## 2012-03-22 VITALS — BP 116/72 | HR 96 | Temp 97.9°F | Ht 66.0 in | Wt 248.0 lb

## 2012-03-22 DIAGNOSIS — Z780 Asymptomatic menopausal state: Secondary | ICD-10-CM

## 2012-03-22 DIAGNOSIS — M79672 Pain in left foot: Secondary | ICD-10-CM

## 2012-03-22 DIAGNOSIS — N760 Acute vaginitis: Secondary | ICD-10-CM | POA: Insufficient documentation

## 2012-03-22 DIAGNOSIS — S92902A Unspecified fracture of left foot, initial encounter for closed fracture: Secondary | ICD-10-CM | POA: Insufficient documentation

## 2012-03-22 DIAGNOSIS — Z0000 Encounter for general adult medical examination without abnormal findings: Secondary | ICD-10-CM

## 2012-03-22 DIAGNOSIS — C539 Malignant neoplasm of cervix uteri, unspecified: Secondary | ICD-10-CM

## 2012-03-22 DIAGNOSIS — K5900 Constipation, unspecified: Secondary | ICD-10-CM

## 2012-03-22 DIAGNOSIS — N390 Urinary tract infection, site not specified: Secondary | ICD-10-CM

## 2012-03-22 DIAGNOSIS — Z124 Encounter for screening for malignant neoplasm of cervix: Secondary | ICD-10-CM | POA: Insufficient documentation

## 2012-03-22 DIAGNOSIS — E538 Deficiency of other specified B group vitamins: Secondary | ICD-10-CM

## 2012-03-22 DIAGNOSIS — D649 Anemia, unspecified: Secondary | ICD-10-CM

## 2012-03-22 DIAGNOSIS — M79609 Pain in unspecified limb: Secondary | ICD-10-CM

## 2012-03-22 DIAGNOSIS — Z01419 Encounter for gynecological examination (general) (routine) without abnormal findings: Secondary | ICD-10-CM | POA: Insufficient documentation

## 2012-03-22 DIAGNOSIS — Z23 Encounter for immunization: Secondary | ICD-10-CM

## 2012-03-22 LAB — CBC
HCT: 36.2 % (ref 36.0–46.0)
Hemoglobin: 12.1 g/dL (ref 12.0–15.0)
MCHC: 33.5 g/dL (ref 30.0–36.0)
MCV: 91.2 fl (ref 78.0–100.0)
Platelets: 222 10*3/uL (ref 150.0–400.0)
RBC: 3.96 Mil/uL (ref 3.87–5.11)
RDW: 14 % (ref 11.5–14.6)
WBC: 4.2 10*3/uL — ABNORMAL LOW (ref 4.5–10.5)

## 2012-03-22 MED ORDER — PNEUMOCOCCAL VAC POLYVALENT 25 MCG/0.5ML IJ INJ: 0.5000 mL | INJECTION | INTRAMUSCULAR | Status: DC

## 2012-03-22 MED ORDER — CYANOCOBALAMIN 1000 MCG/ML IJ SOLN
1000.0000 ug | Freq: Once | INTRAMUSCULAR | Status: AC
  Administered 2012-03-22: 1000 ug via INTRAMUSCULAR

## 2012-03-22 NOTE — Assessment & Plan Note (Signed)
Improved at present. 

## 2012-03-22 NOTE — Assessment & Plan Note (Signed)
Repeat cbc today 

## 2012-03-22 NOTE — Assessment & Plan Note (Signed)
None recently 

## 2012-03-22 NOTE — Patient Instructions (Addendum)
Vaginitis  Vaginitis is an infection. It causes soreness, swelling, and redness (inflammation) of the vagina. Many of these infections are sexually transmitted diseases (STDs). Having unprotected sex can cause further problems and complications such as:   Chronic pelvic pain.   Infertility.   Unwanted pregnancy.   Abortion.   Tubal pregnancy.   Infection passed on to the newborn.   Cancer.  CAUSES    Monilia. This is a yeast or fungus infection, not an STD.   Bacterial vaginosis. The normal balance of bacteria in the vagina is disrupted and is replaced by an overgrowth of certain bacteria.   Gonorrhea, chlamydia. These are bacterial infections that are STDs.   Vaginal sponges, diaphragms, and intrauterine devices.   Trichomoniasis. This is a STD infection caused by a parasite.   Viruses like herpes and human papillomavirus. Both are STDs.   Pregnancy.   Immunosuppression. This occurs with certain conditions such as HIV infection or cancer.   Using bubble bath.   Taking certain antibiotic medicines.   Sporadic recurrence can occur if you become sick.   Diabetes.   Steroids.   Allergic reaction. If you have an allergy to:   Douches.   Soaps.   Spermicides.   Condoms.   Scented tampons or vaginal sprays.  SYMPTOMS    Abnormal vaginal discharge.   Itching of the vagina.   Pain in the vagina.   Swelling of the vagina.  In some cases, there are no symptoms.  TREATMENT   Treatment will vary depending on the type of infection.   Bacteria or trichomonas are usually treated with oral antibiotics and sometimes vaginal cream or suppositories.   Monilia vaginitis is usually treated with vaginal creams, suppositories, or oral antifungal pills.   Viral vaginitis has no cure. However, the symptoms of herpes (a viral vaginitis) can be treated to relieve the discomfort. Human papillomavirus has no symptoms. However, there are treatments for the diseases caused by human papillomavirus.   With allergic  vaginitis, you need to stop using the product that is causing the problem. Vaginal creams can be used to treat the symptoms.   When treating an STD, the sex partner should also be treated.  HOME CARE INSTRUCTIONS    Take all the medicines as directed by your caregiver.   Do not use scented tampons, soaps, or vaginal sprays.   Do not douche.   Tell your sex partner if you have a vaginal infection or an STD.   Do not have sexual intercourse until you have treated the vaginitis.   Practice safe sex by using condoms.  SEEK MEDICAL CARE IF:    You have abdominal pain.   Your symptoms get worse during treatment.  Document Released: 03/09/2007 Document Revised: 08/04/2011 Document Reviewed: 11/02/2008  [employer] Patient Information 2013 [employer]

## 2012-03-22 NOTE — Progress Notes (Signed)
Patient ID: [name], female   DOB: [birth year], 61 y.o.   MRN: [id number] [name] [id number] January 25, 1951 03/22/2012      Progress Note New Patient  Subjective  Chief Complaint  Chief Complaint  Patient presents with  . Injections    b12 and pneumonia  . Gynecologic Exam    pap- odor in vaginal area    HPI  Patient is a 61 year old Caucasian female who is in today for GYN exam. Overall she's feeling well. No recent illness, fevers, chills, GI or GU complaints. She does note she continues to have some vaginal discharge for the symptoms motorist but never painful and no lesions. She needs her B12 shot today. She denies any breast complaints and is up-to-date on mammogram. Has her chronic low back pain but that is not worse than usual. She is noting increased left foot pain and has been told she has a small precursor to a stress fracture in her left foot. They have kept her in an immobility boot and that seems to be helping somewhat.  Past Medical History  Diagnosis Date  . Anemia   . Anxiety   . Depression   . Diverticulosis   . Elevated cholesterol   . Obesity   . THYROMEGALY 07/22/2010  . Overweight 03/11/2010  . Mixed hyperlipidemia 03/11/2010  . MEASLES, HX OF 03/11/2010  . KNEE PAIN, RIGHT 05/28/2010  . HYPERTRIGLYCERIDEMIA 03/11/2010  . HEART MURMUR, HX OF 03/11/2010  . FIBROIDS, UTERUS 03/11/2010  . Diarrhea 08/05/2010  . CONSTIPATION 03/11/2010  . CHICKENPOX, HX OF 03/11/2010  . BIPOLAR AFFECTIVE DISORDER 03/11/2010  . AORTIC VALVE REPLACEMENT, HX OF 03/11/2010  . ANEMIA 05/28/2010  . Acute bronchitis 07/22/2010  . Abdominal pain, generalized 03/19/2010  . Peripheral edema 11/18/2010  . Bacterial vaginitis 11/18/2010  . Hematuria 12/18/2010  . Diverticulitis 12/18/2010  . Hyperthyroidism 12/18/2010  . Fatigue 12/30/2010  . Renal insufficiency 12/30/2010  . Grave's disease 8-12  . Arm lesion 03/23/2011  . UTI (lower urinary tract infection) 08/14/2011  . Breast pain in  female 11/17/2011  . Chest pain 11/17/2011  . Hematoma 12/18/2011  . Cervical cancer 03/22/2012  . Cervical cancer screening 03/22/2012    Past Surgical History  Procedure Date  . Appendectomy     Required revision for EColi infection, required recurrent packing  . Bowel obstruction     Requiring adhesions to be removed  . Aortic valve replacement   . Open heart with a cardiac aneurysm repair   . Cholecystectomy   . Tubal ligation   . Varicose vein surgery b/l legs   . Childhood exploratory surgery of heart   . Hernia repair 08-16-10  . Abdominal hysterectomy     sb/l spo, total    Family History  Problem Relation Age of Onset  . Scoliosis Mother   . Arthritis Mother     Rheumatoid  . Aneurysm Mother     heart  . Alcohol abuse Father   . Depression Sister   . Depression Brother   . Alcohol abuse Brother   . Cancer Maternal Grandmother     colon  . Diabetes Maternal Grandfather   . Heart disease Maternal Grandfather   . Alcohol abuse Maternal Grandfather   . Depression Paternal Grandmother   . Diabetes Paternal Grandmother   . Depression Paternal Grandfather   . Diabetes Paternal Grandfather   . Depression Sister   . Alcohol abuse Brother   . Depression Brother   . Heart disease  Brother     History   Social History  . Marital Status: Married    Spouse Name: N/A    Number of Children: N/A  . Years of Education: N/A   Occupational History  . Not on file.   Social History Main Topics  . Smoking status: Former Smoker    Quit date: 05/26/1990  . Smokeless tobacco: Never Used  . Alcohol Use: 0.0 oz/week    0 drink(s) per week     very serious occasions  . Drug Use: No  . Sexually Active: No   Other Topics Concern  . Not on file   Social History Narrative  . No narrative on file    Current Outpatient Prescriptions on File Prior to Visit  Medication Sig Dispense Refill  . acetaminophen (TYLENOL) 325 MG tablet Take 650 mg by mouth every 6 (six) hours  as needed.       Marland Kitchen albuterol (PROVENTIL HFA;VENTOLIN HFA) 108 (90 BASE) MCG/ACT inhaler Inhale 2 puffs into the lungs every 6 (six) hours as needed for wheezing.  1 Inhaler  1  . aspirin 81 MG tablet Take 81 mg by mouth daily.       . B Complex-C (B-COMPLEX WITH VITAMIN C) tablet Take 1 tablet by mouth daily.      . clonazePAM (KLONOPIN) 0.5 MG tablet Take 0.5 mg by mouth 2 (two) times daily.      . cyanocobalamin (,VITAMIN B-12,) 1000 MCG/ML injection Inject 1,000 mcg into the muscle every 30 (thirty) days. Administered in Physician's office Last dose 11/30/2011      . Docusate Calcium (STOOL SOFTENER PO) Take 4 capsules by mouth at bedtime.       . fenofibrate 160 MG tablet Take 160 mg by mouth daily.       . Ferrous Fumarate-Folic Acid (HEMOCYTE-F) 324-1 MG TABS Take 1 tablet by mouth daily.  90 each  1  . furosemide (LASIX) 20 MG tablet Take 1 tablet (20 mg total) by mouth as directed. 1-2 tabs po daily prn edema  30 tablet  3  . lamoTRIgine (LAMICTAL) 150 MG tablet Take 150 mg by mouth daily.       . metoprolol succinate (TOPROL-XL) 25 MG 24 hr tablet Take 1 tablet (25 mg total) by mouth daily.  30 tablet  6  . omega-3 acid ethyl esters (LOVAZA) 1 G capsule Take 2 g by mouth 2 (two) times daily.      . pantoprazole (PROTONIX) 40 MG tablet Take 1 tablet (40 mg total) by mouth daily.  30 tablet  8  . pravastatin (PRAVACHOL) 40 MG tablet Take 1 tablet (40 mg total) by mouth at bedtime.  30 tablet  1  . Probiotic Product (ALIGN) 4 MG CAPS Take 1 capsule by mouth daily.  30 capsule  0  . Propylene Glycol (SYSTANE BALANCE) 0.6 % SOLN Apply 1 drop to eye daily.      . QUEtiapine (SEROQUEL) 200 MG tablet Take 400 mg by mouth at bedtime. 2- 200mg  tablets at bedtime      . venlafaxine XR (EFFEXOR-XR) 150 MG 24 hr capsule Take 150 mg by mouth daily.      Marland Kitchen warfarin (COUMADIN) 5 MG tablet Take 5 mg by mouth daily. 1/2 to 1 tab: alternating days      . acetaminophen-codeine (TYLENOL #3) 300-30 MG per  tablet Take 1 tablet by mouth every 6 (six) hours as needed. Cough/pain      . nitroGLYCERIN (NITROSTAT) 0.4  MG SL tablet Place 1 tablet (0.4 mg total) under the tongue every 5 (five) minutes as needed for chest pain.  25 tablet  3  . DISCONTD: methimazole (TAPAZOLE) 5 MG tablet Take 1 tablet (5 mg total) by mouth 3 (three) times daily.  30 tablet  2   No current facility-administered medications on file prior to visit.    Allergies  Allergen Reactions  . Sulfonamide Derivatives     headaches    Review of Systems  Review of Systems  Constitutional: Negative for fever and malaise/fatigue.  HENT: Negative for congestion.   Eyes: Negative for discharge.  Respiratory: Negative for shortness of breath.   Cardiovascular: Negative for chest pain, palpitations and leg swelling.  Gastrointestinal: Negative for nausea, abdominal pain and diarrhea.  Genitourinary: Negative for dysuria.  Musculoskeletal: Negative for falls.  Skin: Negative for rash.  Neurological: Negative for loss of consciousness and headaches.  Endo/Heme/Allergies: Negative for polydipsia.  Psychiatric/Behavioral: Negative for depression and suicidal ideas. The patient is not nervous/anxious and does not have insomnia.     Objective  BP 116/72  Pulse 96  Temp 97.9 F (36.6 C) (Temporal)  Ht 5\' 6"  (1.676 m)  Wt 248 lb (112.492 kg)  BMI 40.03 kg/m2  SpO2 96%  Physical Exam  Physical Exam  Constitutional: She is oriented to person, place, and time and well-developed, well-nourished, and in no distress. No distress.  HENT:  Head: Normocephalic and atraumatic.  Right Ear: External ear normal.  Left Ear: External ear normal.  Nose: Nose normal.  Mouth/Throat: Oropharynx is clear and moist. No oropharyngeal exudate.  Eyes: Conjunctivae normal are normal. Pupils are equal, round, and reactive to light. Right eye exhibits no discharge. Left eye exhibits no discharge. No scleral icterus.  Neck: Normal range of  motion. Neck supple. No thyromegaly present.  Cardiovascular: Normal rate, regular rhythm, normal heart sounds and intact distal pulses.   No murmur heard. Pulmonary/Chest: Effort normal and breath sounds normal. No respiratory distress. She has no wheezes. She has no rales.  Abdominal: Soft. Bowel sounds are normal. She exhibits no distension and no mass. There is no tenderness.  Genitourinary: Right adnexa normal and left adnexa normal. Vaginal discharge found.  Musculoskeletal: Normal range of motion. She exhibits no edema and no tenderness.  Lymphadenopathy:    She has no cervical adenopathy.  Neurological: She is alert and oriented to person, place, and time. She has normal reflexes. No cranial nerve deficit. Coordination normal.  Skin: Skin is warm and dry. No rash noted. She is not diaphoretic.  Psychiatric: Mood, memory and affect normal.       Assessment & Plan  ANEMIA Repeat cbc today  CONSTIPATION Improved at present  Recurrent UTI None recently  Cervical cancer screening Pap done today some vaginal discharge was noted so bv and yeast were tested for as wlel. Patient utd on MGM and shots.

## 2012-03-22 NOTE — Telephone Encounter (Signed)
Yep I will order when I do her note

## 2012-03-22 NOTE — Telephone Encounter (Signed)
Pt informed

## 2012-03-22 NOTE — Assessment & Plan Note (Signed)
Pap done today some vaginal discharge was noted so bv and yeast were tested for as wlel. Patient utd on MGM and shots.

## 2012-03-22 NOTE — Telephone Encounter (Signed)
Did you want patient to get a bone density done? If so will you put the order in please. Please advise?

## 2012-03-22 NOTE — Assessment & Plan Note (Signed)
Is has persistent pain. Has been told that she has a precursor to a fracture. She is postmenopausal we will go ahead and order bone scan to check her overall bone health. She will follow up with worsening regarding her particular fracture

## 2012-03-23 NOTE — Progress Notes (Signed)
Quick Note:  Patient Informed and voiced understanding ______ 

## 2012-03-26 MED ORDER — METRONIDAZOLE 500 MG PO TABS: 500.0000 mg | ORAL_TABLET | Freq: Three times a day (TID) | ORAL | Status: DC

## 2012-03-26 NOTE — Addendum Note (Signed)
Addended by: Court Joy on: 03/26/2012 09:16 AM   Modules accepted: Orders

## 2012-03-26 NOTE — Progress Notes (Signed)
Quick Note:  Patient Informed and voiced understanding ______ 

## 2012-04-21 ENCOUNTER — Ambulatory Visit: Payer: PRIVATE HEALTH INSURANCE

## 2012-04-23 ENCOUNTER — Encounter: Payer: Self-pay | Admitting: Family Medicine

## 2012-05-04 ENCOUNTER — Telehealth: Payer: Self-pay | Admitting: Family Medicine

## 2012-05-04 NOTE — Telephone Encounter (Signed)
Please contact pharmacy, they need to change the Rx for the [facility] F for the patient, they do not have it in stock.

## 2012-05-05 NOTE — Telephone Encounter (Signed)
OK find out what other iron, once a day her insurance covers and I will ok a change

## 2012-05-05 NOTE — Telephone Encounter (Signed)
Please advise 

## 2012-05-06 NOTE — Telephone Encounter (Signed)
Pt informed and will contact us tomorrow with the information.   Pt also stated she fell off of her moms porch in [place] and got pins and screws put in her ankle

## 2012-05-17 ENCOUNTER — Telehealth: Payer: Self-pay

## 2012-05-17 NOTE — Telephone Encounter (Signed)
Pt is still in [place] and is stating that the pharmacy is telling her that she needs to have the ferrous fumerate and folic acid in two separate prescriptions? Please advise?  Patient aware that MD is out of the office until tomorrow

## 2012-05-18 MED ORDER — FERROUS FUMARATE 324 MG PO TABS: 1.0000 | ORAL_TABLET | Freq: Every day | ORAL | Status: DC

## 2012-05-18 NOTE — Telephone Encounter (Signed)
Pt informed and RX sent to pharmacy in [place]

## 2012-05-18 NOTE — Telephone Encounter (Signed)
She can have them separated if cheaper. Ferrous Fumerate is most important for her just call that in for her. Folic acid can just be purchased OTC ata 800 mcg daily

## 2012-05-21 ENCOUNTER — Ambulatory Visit: Payer: PRIVATE HEALTH INSURANCE

## 2012-06-22 ENCOUNTER — Ambulatory Visit: Payer: PRIVATE HEALTH INSURANCE | Admitting: Family Medicine

## 2012-07-09 ENCOUNTER — Other Ambulatory Visit: Payer: Self-pay | Admitting: Family Medicine

## 2012-07-15 ENCOUNTER — Telehealth: Payer: Self-pay | Admitting: Family Medicine

## 2012-07-15 NOTE — Telephone Encounter (Signed)
Patient left message on nurse voicemail stating that she is out of town and has a sinus infection with a low grade fever, cough, and blowing yellowish/greenish stuff out of her nose and would like to know if Dr. Abner Greenspan could call her in something. Patient states that she will be out of town for another month.  [facility] phone # [phone]

## 2012-07-15 NOTE — Telephone Encounter (Signed)
OK to send in Augmentin 875 mg 1 tab po bid x 10 days and encouraged Mucinex 600 mg po bid and probiotics daily

## 2012-07-16 MED ORDER — AMOXICILLIN-POT CLAVULANATE 875-125 MG PO TABS: 1.0000 | ORAL_TABLET | Freq: Two times a day (BID) | ORAL | Status: DC

## 2012-07-16 NOTE — Telephone Encounter (Signed)
Notified pt, rx sent.

## 2012-08-12 ENCOUNTER — Other Ambulatory Visit: Payer: Self-pay | Admitting: Family Medicine

## 2012-09-23 ENCOUNTER — Ambulatory Visit (INDEPENDENT_AMBULATORY_CARE_PROVIDER_SITE_OTHER): Payer: BC Managed Care – PPO | Admitting: Family Medicine

## 2012-09-23 ENCOUNTER — Encounter: Payer: Self-pay | Admitting: Family Medicine

## 2012-09-23 VITALS — BP 122/68 | HR 61 | Temp 98.5°F | Ht 66.0 in | Wt 215.1 lb

## 2012-09-23 DIAGNOSIS — R739 Hyperglycemia, unspecified: Secondary | ICD-10-CM

## 2012-09-23 DIAGNOSIS — E782 Mixed hyperlipidemia: Secondary | ICD-10-CM

## 2012-09-23 DIAGNOSIS — R7309 Other abnormal glucose: Secondary | ICD-10-CM

## 2012-09-23 DIAGNOSIS — R5383 Other fatigue: Secondary | ICD-10-CM

## 2012-09-23 DIAGNOSIS — S92909A Unspecified fracture of unspecified foot, initial encounter for closed fracture: Secondary | ICD-10-CM

## 2012-09-23 DIAGNOSIS — S92902A Unspecified fracture of left foot, initial encounter for closed fracture: Secondary | ICD-10-CM

## 2012-09-23 DIAGNOSIS — Z954 Presence of other heart-valve replacement: Secondary | ICD-10-CM

## 2012-09-23 DIAGNOSIS — E0590 Thyrotoxicosis, unspecified without thyrotoxic crisis or storm: Secondary | ICD-10-CM

## 2012-09-23 DIAGNOSIS — D649 Anemia, unspecified: Secondary | ICD-10-CM

## 2012-09-23 DIAGNOSIS — R5381 Other malaise: Secondary | ICD-10-CM

## 2012-09-23 LAB — CBC
HCT: 36.5 % (ref 36.0–46.0)
Hemoglobin: 12.8 g/dL (ref 12.0–15.0)
MCH: 30.5 pg (ref 26.0–34.0)
MCHC: 35.1 g/dL (ref 30.0–36.0)
MCV: 86.9 fL (ref 78.0–100.0)
Platelets: 256 10*3/uL (ref 150–400)
RBC: 4.2 MIL/uL (ref 3.87–5.11)
RDW: 14.5 % (ref 11.5–15.5)
WBC: 5 10*3/uL (ref 4.0–10.5)

## 2012-09-23 LAB — HEMOGLOBIN A1C
Hgb A1c MFr Bld: 5.3 % (ref <5.7)
Mean Plasma Glucose: 105 mg/dL (ref <117)

## 2012-09-23 LAB — TSH: TSH: 2.643 u[IU]/mL (ref 0.350–4.500)

## 2012-09-23 NOTE — Patient Instructions (Addendum)
Rel of Rec [facility] in [place], need admit H and P, discharge note and operative note. Images, labs also

## 2012-09-24 ENCOUNTER — Telehealth: Payer: Self-pay | Admitting: Family Medicine

## 2012-09-24 ENCOUNTER — Encounter: Payer: Self-pay | Admitting: *Deleted

## 2012-09-24 LAB — HEPATIC FUNCTION PANEL
ALT: 22 U/L (ref 0–35)
AST: 30 U/L (ref 0–37)
Albumin: 4.9 g/dL (ref 3.5–5.2)
Alkaline Phosphatase: 32 U/L — ABNORMAL LOW (ref 39–117)
Bilirubin, Direct: 0.2 mg/dL (ref 0.0–0.3)
Indirect Bilirubin: 0.3 mg/dL (ref 0.0–0.9)
Total Bilirubin: 0.5 mg/dL (ref 0.3–1.2)
Total Protein: 7.3 g/dL (ref 6.0–8.3)

## 2012-09-24 LAB — RENAL FUNCTION PANEL
Albumin: 4.9 g/dL (ref 3.5–5.2)
BUN: 22 mg/dL (ref 6–23)
CO2: 25 mEq/L (ref 19–32)
Calcium: 9.7 mg/dL (ref 8.4–10.5)
Chloride: 104 mEq/L (ref 96–112)
Creat: 1.22 mg/dL — ABNORMAL HIGH (ref 0.50–1.10)
Glucose, Bld: 95 mg/dL (ref 70–99)
Phosphorus: 3.3 mg/dL (ref 2.3–4.6)
Potassium: 4 mEq/L (ref 3.5–5.3)
Sodium: 140 mEq/L (ref 135–145)

## 2012-09-24 LAB — IBC PANEL
%SAT: 25 % (ref 20–55)
TIBC: 404 ug/dL (ref 250–470)
UIBC: 305 ug/dL (ref 125–400)

## 2012-09-24 LAB — IRON: Iron: 99 ug/dL (ref 42–145)

## 2012-09-24 NOTE — Telephone Encounter (Signed)
Received medical records from [facility]  P: [phone] F: [phone]

## 2012-09-26 ENCOUNTER — Encounter: Payer: Self-pay | Admitting: Family Medicine

## 2012-09-27 ENCOUNTER — Encounter: Payer: Self-pay | Admitting: Family Medicine

## 2012-09-27 NOTE — Assessment & Plan Note (Signed)
Was in [place] visiting her mother, tripped on her steps and fractured several bones. Had to have plate, screws and pins in foot and was there for several months. Now home, pain is present but tolerable.

## 2012-09-27 NOTE — Progress Notes (Signed)
Patient ID: [name], female   DOB: [birth year], 62 y.o.   MRN: [id number] CIMBERLY STOFFEL [id number] Nov 21, 1950 09/27/2012      Progress Note-Follow Up  Subjective  Chief Complaint  Chief Complaint  Patient presents with  . Follow-up    HPI  Patient is a 62 year old Caucasian female who is into the peripheral. She was up in [place] in the fall of 2013 visit her mother when she fell and fractured her left foot/ankle in multiple places. She ended up having to stay with her for 4 months for surgery and repair. At this point she is ambulating but does have pain in her foot on a daily basis. Pain is tolerable. She reports otherwise her health is generally good. No fevers chills no chest pain, palpitations, shortness of breath, GI or GU concerns.  Past Medical History  Diagnosis Date  . Anemia   . Anxiety   . Depression   . Diverticulosis   . Elevated cholesterol   . Obesity   . THYROMEGALY 07/22/2010  . Overweight 03/11/2010  . Mixed hyperlipidemia 03/11/2010  . MEASLES, HX OF 03/11/2010  . KNEE PAIN, RIGHT 05/28/2010  . HYPERTRIGLYCERIDEMIA 03/11/2010  . HEART MURMUR, HX OF 03/11/2010  . FIBROIDS, UTERUS 03/11/2010  . Diarrhea 08/05/2010  . CONSTIPATION 03/11/2010  . CHICKENPOX, HX OF 03/11/2010  . BIPOLAR AFFECTIVE DISORDER 03/11/2010  . AORTIC VALVE REPLACEMENT, HX OF 03/11/2010  . ANEMIA 05/28/2010  . Acute bronchitis 07/22/2010  . Abdominal pain, generalized 03/19/2010  . Peripheral edema 11/18/2010  . Bacterial vaginitis 11/18/2010  . Hematuria 12/18/2010  . Diverticulitis 12/18/2010  . Hyperthyroidism 12/18/2010  . Fatigue 12/30/2010  . Renal insufficiency 12/30/2010  . Grave's disease 8-12  . Arm lesion 03/23/2011  . UTI (lower urinary tract infection) 08/14/2011  . Breast pain in female 11/17/2011  . Chest pain 11/17/2011  . Hematoma 12/18/2011  . Cervical cancer 03/22/2012  . Cervical cancer screening 03/22/2012  . Foot pain, left 03/22/2012  . Foot fracture, left  03/22/2012    Past Surgical History  Procedure Laterality Date  . Appendectomy      Required revision for EColi infection, required recurrent packing  . Bowel obstruction      Requiring adhesions to be removed  . Aortic valve replacement    . Open heart with a cardiac aneurysm repair    . Cholecystectomy    . Tubal ligation    . Varicose vein surgery b/l legs    . Childhood exploratory surgery of heart    . Hernia repair  08-16-10  . Abdominal hysterectomy      sb/l spo, total    Family History  Problem Relation Age of Onset  . Scoliosis Mother   . Arthritis Mother     Rheumatoid  . Aneurysm Mother     heart  . Alcohol abuse Father   . Depression Sister   . Depression Brother   . Alcohol abuse Brother   . Cancer Maternal Grandmother     colon  . Diabetes Maternal Grandfather   . Heart disease Maternal Grandfather   . Alcohol abuse Maternal Grandfather   . Depression Paternal Grandmother   . Diabetes Paternal Grandmother   . Depression Paternal Grandfather   . Diabetes Paternal Grandfather   . Depression Sister   . Alcohol abuse Brother   . Depression Brother   . Heart disease Brother     History   Social History  . Marital Status: Married  Spouse Name: N/A    Number of Children: N/A  . Years of Education: N/A   Occupational History  . Not on file.   Social History Main Topics  . Smoking status: Former Smoker    Quit date: 05/26/1990  . Smokeless tobacco: Never Used  . Alcohol Use: 0.0 oz/week    0 drink(s) per week     Comment: very serious occasions  . Drug Use: No  . Sexually Active: No   Other Topics Concern  . Not on file   Social History Narrative  . No narrative on file    Current Outpatient Prescriptions on File Prior to Visit  Medication Sig Dispense Refill  . acetaminophen (TYLENOL) 325 MG tablet Take 650 mg by mouth every 6 (six) hours as needed.       Marland Kitchen acetaminophen-codeine (TYLENOL #3) 300-30 MG per tablet Take 1 tablet by  mouth every 6 (six) hours as needed. Cough/pain      . aspirin 81 MG tablet Take 81 mg by mouth daily.       . B Complex-C (B-COMPLEX WITH VITAMIN C) tablet Take 1 tablet by mouth daily.      . clonazePAM (KLONOPIN) 0.5 MG tablet Take 0.5 mg by mouth 2 (two) times daily.      . cyanocobalamin (,VITAMIN B-12,) 1000 MCG/ML injection Inject 1,000 mcg into the muscle every 30 (thirty) days. Administered in Physician's office Last dose 11/30/2011      . Docusate Calcium (STOOL SOFTENER PO) Take 4 capsules by mouth at bedtime.       . fenofibrate 160 MG tablet Take 160 mg by mouth daily.       . Ferrous Fumarate 324 MG TABS Take 1 tablet (106 mg of iron total) by mouth daily.  30 each  1  . Ferrous Fumarate-Folic Acid (HEMOCYTE-F) 324-1 MG TABS Take 1 tablet by mouth daily.  90 each  1  . furosemide (LASIX) 20 MG tablet TAKE 1 TABLET EVERY DAY FOR 7 DAYS THEN ONLY AS NEEDED FOR EDEMA OR WEIGHT GAIN >3 POUNDS IN 24HRS  30 tablet  2  . lamoTRIgine (LAMICTAL) 150 MG tablet Take 150 mg by mouth daily.       . metoprolol succinate (TOPROL-XL) 25 MG 24 hr tablet TAKE 1 TABLET (25 MG TOTAL) BY MOUTH DAILY.  30 tablet  0  . omega-3 acid ethyl esters (LOVAZA) 1 G capsule Take 2 g by mouth 2 (two) times daily.      . pantoprazole (PROTONIX) 40 MG tablet TAKE 1 TABLET EVERY DAY  30 tablet  0  . pravastatin (PRAVACHOL) 40 MG tablet Take 1 tablet (40 mg total) by mouth at bedtime.  30 tablet  1  . Probiotic Product (ALIGN) 4 MG CAPS Take 1 capsule by mouth daily.  30 capsule  0  . Propylene Glycol (SYSTANE BALANCE) 0.6 % SOLN Apply 1 drop to eye daily.      . QUEtiapine (SEROQUEL) 200 MG tablet Take 400 mg by mouth at bedtime. 2- 200mg  tablets at bedtime      . venlafaxine XR (EFFEXOR-XR) 150 MG 24 hr capsule Take 150 mg by mouth daily.      Marland Kitchen warfarin (COUMADIN) 5 MG tablet Take 5 mg by mouth daily. 1/2 to 1 tab: alternating days      . nitroGLYCERIN (NITROSTAT) 0.4 MG SL tablet Place 1 tablet (0.4 mg total) under  the tongue every 5 (five) minutes as needed for chest pain.  25  tablet  3  . [DISCONTINUED] methimazole (TAPAZOLE) 5 MG tablet Take 1 tablet (5 mg total) by mouth 3 (three) times daily.  30 tablet  2   No current facility-administered medications on file prior to visit.    Allergies  Allergen Reactions  . Sulfonamide Derivatives     headaches    Review of Systems  Review of Systems  Constitutional: Positive for malaise/fatigue. Negative for fever.  HENT: Positive for neck pain. Negative for congestion.   Eyes: Negative for discharge.  Respiratory: Negative for shortness of breath.   Cardiovascular: Negative for chest pain, palpitations and leg swelling.  Gastrointestinal: Negative for nausea, abdominal pain and diarrhea.  Genitourinary: Negative for dysuria.  Musculoskeletal: Positive for joint pain. Negative for falls.       Left foot, ankle pain  Skin: Negative for rash.  Neurological: Negative for loss of consciousness and headaches.  Endo/Heme/Allergies: Negative for polydipsia.  Psychiatric/Behavioral: Negative for depression and suicidal ideas. The patient is not nervous/anxious and does not have insomnia.     Objective  BP 122/68  Pulse 61  Temp(Src) 98.5 F (36.9 C) (Oral)  Ht 5\' 6"  (1.676 m)  Wt 215 lb 1.9 oz (97.578 kg)  BMI 34.74 kg/m2  SpO2 97%  Physical Exam  Physical Exam  Constitutional: She is oriented to person, place, and time and well-developed, well-nourished, and in no distress. No distress.  HENT:  Head: Normocephalic and atraumatic.  Eyes: Conjunctivae are normal.  Neck: Neck supple. No thyromegaly present.  Cardiovascular: Normal rate and regular rhythm.   Murmur heard. Mechanical click  Pulmonary/Chest: Effort normal and breath sounds normal. She has no wheezes.  Abdominal: She exhibits no distension and no mass.  Musculoskeletal: She exhibits no edema.  Lymphadenopathy:    She has no cervical adenopathy.  Neurological: She is alert  and oriented to person, place, and time.  Skin: Skin is warm and dry. No rash noted. She is not diaphoretic.  Psychiatric: Memory, affect and judgment normal.    Lab Results  Component Value Date   TSH 2.643 09/23/2012   Lab Results  Component Value Date   WBC 5.0 09/23/2012   HGB 12.8 09/23/2012   HCT 36.5 09/23/2012   MCV 86.9 09/23/2012   PLT 256 09/23/2012   Lab Results  Component Value Date   CREATININE 1.22* 09/23/2012   BUN 22 09/23/2012   NA 140 09/23/2012   K 4.0 09/23/2012   CL 104 09/23/2012   CO2 25 09/23/2012   Lab Results  Component Value Date   ALT 22 09/23/2012   AST 30 09/23/2012   ALKPHOS 32* 09/23/2012   BILITOT 0.5 09/23/2012   Lab Results  Component Value Date   CHOL 166 09/22/2011   Lab Results  Component Value Date   HDL 48.00 09/22/2011   No results found for this basename: LDLCALC   Lab Results  Component Value Date   TRIG 203.0* 09/22/2011   Lab Results  Component Value Date   CHOLHDL 3 09/22/2011     Assessment & Plan  Hyperthyroidism Following with endocrinology but has been out of townfor an extended period of time so checked levels today and they were wnl  Foot fracture, left Was in [place] visiting her mother, tripped on her steps and fractured several bones. Had to have plate, screws and pins in foot and was there for several months. Now home, pain is present but tolerable.  MIXED HYPERLIPIDEMIA Tolerating Pravastatin, total cholesterol acceptable, elevated TG needs to  minimize simple carbs and continue current meds

## 2012-09-27 NOTE — Assessment & Plan Note (Signed)
Following with endocrinology but has been out of townfor an extended period of time so checked levels today and they were wnl

## 2012-09-27 NOTE — Assessment & Plan Note (Signed)
Tolerating Pravastatin, total cholesterol acceptable, elevated TG needs to minimize simple carbs and continue current meds

## 2012-10-12 ENCOUNTER — Encounter: Payer: Self-pay | Admitting: Family Medicine

## 2012-10-12 ENCOUNTER — Telehealth: Payer: Self-pay | Admitting: Family Medicine

## 2012-10-12 ENCOUNTER — Ambulatory Visit (INDEPENDENT_AMBULATORY_CARE_PROVIDER_SITE_OTHER): Payer: BC Managed Care – PPO | Admitting: Family Medicine

## 2012-10-12 VITALS — BP 120/62 | HR 54 | Temp 98.1°F | Ht 66.0 in | Wt 216.0 lb

## 2012-10-12 DIAGNOSIS — W57XXXA Bitten or stung by nonvenomous insect and other nonvenomous arthropods, initial encounter: Secondary | ICD-10-CM

## 2012-10-12 DIAGNOSIS — R5383 Other fatigue: Secondary | ICD-10-CM

## 2012-10-12 DIAGNOSIS — T148 Other injury of unspecified body region: Secondary | ICD-10-CM

## 2012-10-12 DIAGNOSIS — R21 Rash and other nonspecific skin eruption: Secondary | ICD-10-CM

## 2012-10-12 DIAGNOSIS — R5381 Other malaise: Secondary | ICD-10-CM

## 2012-10-12 DIAGNOSIS — S30860A Insect bite (nonvenomous) of lower back and pelvis, initial encounter: Secondary | ICD-10-CM

## 2012-10-12 MED ORDER — FUROSEMIDE 20 MG PO TABS: 20.0000 mg | ORAL_TABLET | Freq: Every day | ORAL | Status: DC | PRN

## 2012-10-12 MED ORDER — PANTOPRAZOLE SODIUM 40 MG PO TBEC: DELAYED_RELEASE_TABLET | ORAL | Status: DC

## 2012-10-12 NOTE — Telephone Encounter (Signed)
Sent in to pharmacy.  

## 2012-10-12 NOTE — Telephone Encounter (Signed)
90 day supply request  Furosemide 20mg  tablet.

## 2012-10-12 NOTE — Patient Instructions (Addendum)
Wood Tick Bite Ticks are insects that attach themselves to the skin. Most tick bites are harmless, but sometimes ticks carry diseases that can make a person quite ill. The chance of getting ill depends on:  The kind of tick that bites you.  Time of year.  How long the tick is attached.  Geographic location. Wood ticks are also called dog ticks. They are generally black. They can have white markings. They live in shrubs and grassy areas. They are larger than deer ticks. Wood ticks are about the size of a watermelon seed. They have a hard body. The most common places for ticks to attach themselves are the scalp, neck, armpits, waist, and groin. Wood ticks may stay attached for up to 2 weeks. TICKS MUST BE REMOVED AS SOON AS POSSIBLE TO HELP PREVENT DISEASES CAUSED BY TICK BITES.  TO REMOVE A TICK: 1. If available, put on latex gloves before trying to remove a tick. 2. Grasp the tick as close to the skin as possible, with curved forceps, fine tweezers or a special tick removal tool. 3. Pull gently with steady pressure until the tick lets go. Do not twist the tick or jerk it suddenly. This may break off the tick's head or mouth parts. 4. Do not crush the tick's body. This could force disease-carrying fluids from the tick into your body. 5. After the tick is removed, wash the bite area and your hands with soap and water or other disinfectant. 6. Apply a small amount of antiseptic cream or ointment to the bite site. 7. Wash and disinfect any instruments that were used. 8. Save the tick in a jar or plastic bag for later identification. Preserve the tick with a bit of alcohol or put it in the freezer. 9. Do not apply a hot match, petroleum jelly, or fingernail polish to the tick. This does not work and may increase the chances of disease from the tick bite. YOU MAY NEED TO SEE YOUR CAREGIVER FOR A TETANUS SHOT NOW IF:  You have no idea when you had the last one.  You have never had a tetanus  shot before. If you need a tetanus shot, and you decide not to get one, there is a rare chance of getting tetanus. Sickness from tetanus can be serious. If you get a tetanus shot, your arm may swell, get red and warm to the touch at the shot site. This is common and not a problem. PREVENTION  Wear protective clothing. Long sleeves and pants are best.  Wear white clothes to see ticks more easily  Tuck your pant legs into your socks.  If walking on trail, stay in the middle of the trail to avoid brushing against bushes.  Put insect repellent on all exposed skin and along boot tops, pant legs and sleeve cuffs  Check clothing, hair and skin repeatedly and before coming inside.  Brush off any ticks that are not attached. SEEK MEDICAL CARE IF:   You cannot remove a tick or part of the tick that is left in the skin.  Unexplained fever.  Redness and swelling in the area of the tick bite.  Tender, swollen lymph glands.  Diarrhea.  Weight loss.  Cough.  Fatigue.  Muscle, joint or bone pain.  Belly pain.  Headache.  Rash. SEEK IMMEDIATE MEDICAL CARE IF:   You develop an oral temperature above 102 F (38.9 C).  You are having trouble walking or moving your legs.  Numbness in the legs.  Shortness of breath.  Confusion.  Repeated vomiting. Document Released: 05/09/2000 Document Revised: 08/04/2011 Document Reviewed: 04/17/2008 [facility] Patient Information 2013 [place], [place]. Deer Tick Bite Deer ticks are brown arachnids (spider family) that vary in size from as small as the head of a pin to 1/4 inch (1/2 cm) diameter. They thrive in wooded areas. Deer are the preferred host of adult deer ticks. Small rodents are the host of young ticks (nymphs). When a person walks in a field or wooded area, young and adult ticks in the surrounding grass and vegetation can attach themselves to the skin. They can suck blood for hours to days if unnoticed. Ticks are found all over the  [place] Some ticks carry a specific bacteria (Borrelia burgdorferi) that causes an infection called Lyme disease. The bacteria is typically passed into a person during the blood sucking process. This happens after the tick has been attached for at least a number of hours. While ticks can be found all over the [place] those carrying the bacteria that causes Lyme disease are most common in [place] and the [place]. Only a small proportion of ticks in these areas carry the Lyme disease bacteria and cause human infections. Ticks usually attach to warm spots on the body, such as the:  Head.  Back.  Neck.  Armpits.  Groin. SYMPTOMS  Most of the time, a deer tick bite will not be felt. You may or may not see the attached tick. You may notice mild irritation or redness around the bite site. If the deer tick passes the Lyme disease bacteria to a person, a round, red rash may be noticed 2 to 3 days after the bite. The rash may be clear in the middle, like a bull's-eye or target. If not treated, other symptoms may develop several days to weeks after the onset of the rash. These symptoms may include:  New rash lesions.  Fatigue and weakness.  General ill feeling and achiness.  Chills.  Headache and neck pain.  Swollen lymph glands.  Sore muscles and joints. 5 to 15% of untreated people with Lyme disease may develop more severe illnesses after several weeks to months. This may include inflammation of the brain lining (meningitis), nerve palsies, an abnormal heartbeat, or severe muscle and joint pain and inflammation (myositis or arthritis). DIAGNOSIS   Physical exam and medical history.  Viewing the tick if it was saved for confirmation.  Blood tests (to check or confirm the presence of Lyme disease). TREATMENT  Most ticks do not carry disease. If found, an attached tick should be removed using tweezers. Tweezers should be placed under the body of the tick so it is removed by its attachment  parts (pincers). If there are signs or symptoms of being sick, or Lyme disease is confirmed, medicines (antibiotics) that kill germs are usually prescribed. In more severe cases, antibiotics may be given through an intravenous (IV) access. HOME CARE INSTRUCTIONS   Always remove ticks with tweezers. Do not use petroleum jelly or other methods to kill or remove the tick. Slide the tweezers under the body and pull out as much as you can. If you are not sure what it is, save it in a jar and show your caregiver.  Once you remove the tick, the skin will heal on its own. Wash your hands and the affected area with water and soap. You may place a bandage on the affected area.  Take medicine as directed. You may be advised to take a full  course of antibiotics.  Follow up with your caregiver as recommended. FINDING OUT THE RESULTS OF YOUR TEST Not all test results are available during your visit. If your test results are not back during the visit, make an appointment with your caregiver to find out the results. Do not assume everything is normal if you have not heard from your caregiver or the medical facility. It is important for you to follow up on all of your test results. PROGNOSIS  If Lyme disease is confirmed, early treatment with antibiotics is very effective. Following preventive guidelines is important since it is possible to get the disease more than once. PREVENTION   Wear long sleeves and long pants in wooded or grassy areas. Tuck your pants into your socks.  Use an insect repellent while hiking.  Check yourself, your children, and your pets regularly for ticks after playing outside.  Clear piles of leaves or brush from your yard. Ticks might live there. SEEK MEDICAL CARE IF:   You or your child has an oral temperature above 102 F (38.9 C).  You develop a severe headache following the bite.  You feel generally ill.  You notice a rash.  You are having trouble removing the  tick.  The bite area has red skin or yellow drainage. SEEK IMMEDIATE MEDICAL CARE IF:   Your face is weak and droopy or you have other neurological symptoms.  You have severe joint pain or weakness. MAKE SURE YOU:   Understand these instructions.  Will watch your condition.  Will get help right away if you are not doing well or get worse. FOR MORE INFORMATION Centers for Disease Control and Prevention: FootballExhibition.com.br American [employer]: www.https://powers.com/ Document Released: 08/06/2009 Document Revised: 08/04/2011 Document Reviewed: 08/06/2009 [facility] Patient Information 2013 [place], [place].

## 2012-10-12 NOTE — Telephone Encounter (Signed)
Rx sent in to pharmacy. 

## 2012-10-12 NOTE — Progress Notes (Signed)
Patient ID: [name], female   DOB: 1950-12-07, 62 y.o.   MRN: [id number] [name] [id number] 01/08/1951 10/12/2012      Progress Note-Follow Up  Subjective  Chief Complaint  Chief Complaint  Patient presents with  . tick bite    back- noticed it on Sunday    HPI  Patient is a 62 year old Caucasian female who is in today concerned about some take bites. They're on her back and she noticed a couple days ago there is an itchy persistent red rash. She is unsure how long the ticks were on her skin. She denies fevers or chills but she does note some persistent fatigue. Denies headache or worsening myalgias. No other acute complaints. No chest pain, palpitations, shortness of breath, GI or GU concerns  Past Medical History  Diagnosis Date  . Anemia   . Anxiety   . Depression   . Diverticulosis   . Elevated cholesterol   . Obesity   . THYROMEGALY 07/22/2010  . Overweight 03/11/2010  . Mixed hyperlipidemia 03/11/2010  . MEASLES, HX OF 03/11/2010  . KNEE PAIN, RIGHT 05/28/2010  . HYPERTRIGLYCERIDEMIA 03/11/2010  . HEART MURMUR, HX OF 03/11/2010  . FIBROIDS, UTERUS 03/11/2010  . Diarrhea 08/05/2010  . CONSTIPATION 03/11/2010  . CHICKENPOX, HX OF 03/11/2010  . BIPOLAR AFFECTIVE DISORDER 03/11/2010  . AORTIC VALVE REPLACEMENT, HX OF 03/11/2010  . ANEMIA 05/28/2010  . Acute bronchitis 07/22/2010  . Abdominal pain, generalized 03/19/2010  . Peripheral edema 11/18/2010  . Bacterial vaginitis 11/18/2010  . Hematuria 12/18/2010  . Diverticulitis 12/18/2010  . Hyperthyroidism 12/18/2010  . Fatigue 12/30/2010  . Renal insufficiency 12/30/2010  . Grave's disease 8-12  . Arm lesion 03/23/2011  . UTI (lower urinary tract infection) 08/14/2011  . Breast pain in female 11/17/2011  . Chest pain 11/17/2011  . Hematoma 12/18/2011  . Cervical cancer 03/22/2012  . Cervical cancer screening 03/22/2012  . Foot pain, left 03/22/2012  . Foot fracture, left 03/22/2012    Past Surgical History   Procedure Laterality Date  . Appendectomy      Required revision for EColi infection, required recurrent packing  . Bowel obstruction      Requiring adhesions to be removed  . Aortic valve replacement    . Open heart with a cardiac aneurysm repair    . Cholecystectomy    . Tubal ligation    . Varicose vein surgery b/l legs    . Childhood exploratory surgery of heart    . Hernia repair  08-16-10  . Abdominal hysterectomy      sb/l spo, total    Family History  Problem Relation Age of Onset  . Scoliosis Mother   . Arthritis Mother     Rheumatoid  . Aneurysm Mother     heart  . Alcohol abuse Father   . Depression Sister   . Depression Brother   . Alcohol abuse Brother   . Cancer Maternal Grandmother     colon  . Diabetes Maternal Grandfather   . Heart disease Maternal Grandfather   . Alcohol abuse Maternal Grandfather   . Depression Paternal Grandmother   . Diabetes Paternal Grandmother   . Depression Paternal Grandfather   . Diabetes Paternal Grandfather   . Depression Sister   . Alcohol abuse Brother   . Depression Brother   . Heart disease Brother     History   Social History  . Marital Status: Married    Spouse Name: N/A    Number  of Children: N/A  . Years of Education: N/A   Occupational History  . Not on file.   Social History Main Topics  . Smoking status: Former Smoker    Quit date: 05/26/1990  . Smokeless tobacco: Never Used  . Alcohol Use: 0.0 oz/week    0 drink(s) per week     Comment: very serious occasions  . Drug Use: No  . Sexually Active: No   Other Topics Concern  . Not on file   Social History Narrative  . No narrative on file    Current Outpatient Prescriptions on File Prior to Visit  Medication Sig Dispense Refill  . acetaminophen (TYLENOL) 325 MG tablet Take 650 mg by mouth every 6 (six) hours as needed.       Marland Kitchen acetaminophen-codeine (TYLENOL #3) 300-30 MG per tablet Take 1 tablet by mouth every 6 (six) hours as needed.  Cough/pain      . aspirin 81 MG tablet Take 81 mg by mouth daily.       . B Complex-C (B-COMPLEX WITH VITAMIN C) tablet Take 1 tablet by mouth daily.      . clonazePAM (KLONOPIN) 0.5 MG tablet Take 0.5 mg by mouth 2 (two) times daily.      . cyanocobalamin (,VITAMIN B-12,) 1000 MCG/ML injection Inject 1,000 mcg into the muscle every 30 (thirty) days. Administered in Physician's office Last dose 11/30/2011      . Docusate Calcium (STOOL SOFTENER PO) Take 4 capsules by mouth at bedtime.       . fenofibrate 160 MG tablet Take 160 mg by mouth daily.       . Ferrous Fumarate 324 MG TABS Take 1 tablet (106 mg of iron total) by mouth daily.  30 each  1  . Ferrous Fumarate-Folic Acid (HEMOCYTE-F) 324-1 MG TABS Take 1 tablet by mouth daily.  90 each  1  . furosemide (LASIX) 20 MG tablet TAKE 1 TABLET EVERY DAY FOR 7 DAYS THEN ONLY AS NEEDED FOR EDEMA OR WEIGHT GAIN >3 POUNDS IN 24HRS  30 tablet  2  . lamoTRIgine (LAMICTAL) 150 MG tablet Take 150 mg by mouth daily.       Marland Kitchen omega-3 acid ethyl esters (LOVAZA) 1 G capsule Take 2 g by mouth 2 (two) times daily.      . pravastatin (PRAVACHOL) 40 MG tablet Take 1 tablet (40 mg total) by mouth at bedtime.  30 tablet  1  . Probiotic Product (ALIGN) 4 MG CAPS Take 1 capsule by mouth daily.  30 capsule  0  . Propylene Glycol (SYSTANE BALANCE) 0.6 % SOLN Apply 1 drop to eye daily.      . QUEtiapine (SEROQUEL) 200 MG tablet Take 400 mg by mouth at bedtime. 2- 200mg  tablets at bedtime      . venlafaxine XR (EFFEXOR-XR) 150 MG 24 hr capsule Take 150 mg by mouth daily.      Marland Kitchen warfarin (COUMADIN) 5 MG tablet Take 5 mg by mouth daily. 1/2 to 1 tab: alternating days      . nitroGLYCERIN (NITROSTAT) 0.4 MG SL tablet Place 1 tablet (0.4 mg total) under the tongue every 5 (five) minutes as needed for chest pain.  25 tablet  3  . [DISCONTINUED] methimazole (TAPAZOLE) 5 MG tablet Take 1 tablet (5 mg total) by mouth 3 (three) times daily.  30 tablet  2   No current  facility-administered medications on file prior to visit.    Allergies  Allergen Reactions  .  Sulfonamide Derivatives     headaches    Review of Systems  Review of Systems  Constitutional: Negative for fever and malaise/fatigue.  HENT: Negative for congestion.   Eyes: Negative for discharge.  Respiratory: Negative for shortness of breath.   Cardiovascular: Negative for chest pain, palpitations and leg swelling.  Gastrointestinal: Negative for nausea, abdominal pain and diarrhea.  Genitourinary: Negative for dysuria.  Musculoskeletal: Negative for falls.  Skin: Positive for rash.  Neurological: Negative for loss of consciousness and headaches.  Endo/Heme/Allergies: Negative for polydipsia.  Psychiatric/Behavioral: Negative for depression and suicidal ideas. The patient is not nervous/anxious and does not have insomnia.     Objective  BP 120/62  Pulse 54  Temp(Src) 98.1 F (36.7 C) (Oral)  Ht 5\' 6"  (1.676 m)  Wt 216 lb (97.977 kg)  BMI 34.88 kg/m2  SpO2 96%  Physical Exam  Physical Exam  Constitutional: She is well-developed, well-nourished, and in no distress. No distress.  HENT:  Left Ear: External ear normal.  Mouth/Throat: No oropharyngeal exudate.  Eyes: EOM are normal. Left eye exhibits no discharge. No scleral icterus.  Neck: No JVD present. No tracheal deviation present.  Cardiovascular: Normal heart sounds and intact distal pulses.   Pulmonary/Chest: She is in respiratory distress. She has no rales.  Abdominal: She exhibits no distension and no mass. There is tenderness. There is no guarding.  Musculoskeletal: She exhibits no edema and no tenderness.  Lymphadenopathy:    She has no cervical adenopathy.  Skin: No rash noted. No erythema.  Rash 1 x 3 cm raised macular erythematous lesion, with darker ring around edge, papular lesion in center with small scab    Lab Results  Component Value Date   TSH 2.643 09/23/2012   Lab Results  Component Value Date    WBC 5.0 09/23/2012   HGB 12.8 09/23/2012   HCT 36.5 09/23/2012   MCV 86.9 09/23/2012   PLT 256 09/23/2012   Lab Results  Component Value Date   CREATININE 1.22* 09/23/2012   BUN 22 09/23/2012   NA 140 09/23/2012   K 4.0 09/23/2012   CL 104 09/23/2012   CO2 25 09/23/2012   Lab Results  Component Value Date   ALT 22 09/23/2012   AST 30 09/23/2012   ALKPHOS 32* 09/23/2012   BILITOT 0.5 09/23/2012   Lab Results  Component Value Date   CHOL 166 09/22/2011   Lab Results  Component Value Date   HDL 48.00 09/22/2011   No results found for this basename: [facility]   Lab Results  Component Value Date   TRIG 203.0* 09/22/2011   Lab Results  Component Value Date   CHOLHDL 3 09/22/2011     Assessment & Plan  Tick bite of back Lyme and RMSF titers are negative. Patient will report worsening symptoms  Fatigue Persistent but no changes.

## 2012-10-12 NOTE — Telephone Encounter (Signed)
90 day request   pantoprazole sod dr 40mg  tab.

## 2012-10-13 LAB — ROCKY MTN SPOTTED FVR ABS PNL(IGG+IGM)
RMSF IgG: 0.08 IV
RMSF IgM: 0.12 IV

## 2012-10-13 LAB — B. BURGDORFI ANTIBODIES: B burgdorferi Ab IgG+IgM: 0.12 {ISR}

## 2012-10-15 ENCOUNTER — Encounter: Payer: Self-pay | Admitting: Family Medicine

## 2012-10-15 DIAGNOSIS — S30860A Insect bite (nonvenomous) of lower back and pelvis, initial encounter: Secondary | ICD-10-CM | POA: Insufficient documentation

## 2012-10-15 NOTE — Assessment & Plan Note (Signed)
Persistent but no changes.

## 2012-10-15 NOTE — Assessment & Plan Note (Signed)
Lyme and RMSF titers are negative. Patient will report worsening symptoms

## 2012-10-19 ENCOUNTER — Ambulatory Visit: Payer: BC Managed Care – PPO | Admitting: Family Medicine

## 2012-10-25 ENCOUNTER — Ambulatory Visit (INDEPENDENT_AMBULATORY_CARE_PROVIDER_SITE_OTHER): Payer: BC Managed Care – PPO | Admitting: Family Medicine

## 2012-10-25 ENCOUNTER — Encounter: Payer: Self-pay | Admitting: Family Medicine

## 2012-10-25 VITALS — BP 122/82 | HR 63 | Temp 98.2°F | Ht 66.0 in | Wt 210.1 lb

## 2012-10-25 DIAGNOSIS — E782 Mixed hyperlipidemia: Secondary | ICD-10-CM

## 2012-10-25 DIAGNOSIS — K5900 Constipation, unspecified: Secondary | ICD-10-CM

## 2012-10-25 DIAGNOSIS — Z954 Presence of other heart-valve replacement: Secondary | ICD-10-CM

## 2012-10-25 DIAGNOSIS — G8929 Other chronic pain: Secondary | ICD-10-CM

## 2012-10-25 DIAGNOSIS — M25569 Pain in unspecified knee: Secondary | ICD-10-CM

## 2012-10-25 DIAGNOSIS — N289 Disorder of kidney and ureter, unspecified: Secondary | ICD-10-CM

## 2012-10-25 NOTE — Patient Instructions (Addendum)
Start a probiotic such as Digestive Advantage daily  consider a fiber supplement such as Benefiber or Citracel daily for constipation or diarrhea Labs ahead of next visit, lipid, renal, cbc, tsh, hepatic, hgba1c  Diarrhea Diarrhea is frequent loose and watery bowel movements. It can cause you to feel weak and dehydrated. Dehydration can cause you to become tired and thirsty, have a dry mouth, and have decreased urination that often is dark yellow. Diarrhea is a sign of another problem, most often an infection that will not last long. In most cases, diarrhea typically lasts 2 3 days. However, it can last longer if it is a sign of something more serious. It is important to treat your diarrhea as directed by your caregive to lessen or prevent future episodes of diarrhea. CAUSES  Some common causes include:  Gastrointestinal infections caused by viruses, bacteria, or parasites.  Food poisoning or food allergies.  Certain medicines, such as antibiotics, chemotherapy, and laxatives.  Artificial sweeteners and fructose.  Digestive disorders. HOME CARE INSTRUCTIONS  Ensure adequate fluid intake (hydration): have 1 cup (8 oz) of fluid for each diarrhea episode. Avoid fluids that contain simple sugars or sports drinks, fruit juices, whole milk products, and sodas. Your urine should be clear or pale yellow if you are drinking enough fluids. Hydrate with an oral rehydration solution that you can purchase at pharmacies, retail stores, and online. You can prepare an oral rehydration solution at home by mixing the following ingredients together:    tsp table salt.   tsp baking soda.   tsp salt substitute containing potassium chloride.  1  tablespoons sugar.  1 L (34 oz) of water.  Certain foods and beverages may increase the speed at which food moves through the gastrointestinal (GI) tract. These foods and beverages should be avoided and include:  Caffeinated and alcoholic  beverages.  High-fiber foods, such as raw fruits and vegetables, nuts, seeds, and whole grain breads and cereals.  Foods and beverages sweetened with sugar alcohols, such as xylitol, sorbitol, and mannitol.  Some foods may be well tolerated and may help thicken stool including:  Starchy foods, such as rice, toast, pasta, low-sugar cereal, oatmeal, grits, baked potatoes, crackers, and bagels.  Bananas.  Applesauce.  Add probiotic-rich foods to help increase healthy bacteria in the GI tract, such as yogurt and fermented milk products.  Wash your hands well after each diarrhea episode.  Only take over-the-counter or prescription medicines as directed by your caregiver.  Take a warm bath to relieve any burning or pain from frequent diarrhea episodes. SEEK IMMEDIATE MEDICAL CARE IF:   You are unable to keep fluids down.  You have persistent vomiting.  You have blood in your stool, or your stools are black and tarry.  You do not urinate in 6 8 hours, or there is only a small amount of very dark urine.  You have abdominal pain that increases or localizes.  You have weakness, dizziness, confusion, or lightheadedness.  You have a severe headache.  Your diarrhea gets worse or does not get better.  You have a fever or persistent symptoms for more than 2 3 days.  You have a fever and your symptoms suddenly get worse. MAKE SURE YOU:   Understand these instructions.  Will watch your condition.  Will get help right away if you are not doing well or get worse. Document Released: 05/02/2002 Document Revised: 04/28/2012 Document Reviewed: 01/18/2012 [facility] Patient Information 2014 [place], [place].

## 2012-10-26 ENCOUNTER — Encounter: Payer: Self-pay | Admitting: Family Medicine

## 2012-10-26 LAB — RENAL FUNCTION PANEL
Albumin: 4.9 g/dL (ref 3.5–5.2)
BUN: 22 mg/dL (ref 6–23)
CO2: 27 mEq/L (ref 19–32)
Calcium: 10.1 mg/dL (ref 8.4–10.5)
Chloride: 103 mEq/L (ref 96–112)
Creat: 1.26 mg/dL — ABNORMAL HIGH (ref 0.50–1.10)
Glucose, Bld: 84 mg/dL (ref 70–99)
Phosphorus: 3.5 mg/dL (ref 2.3–4.6)
Potassium: 4.4 mEq/L (ref 3.5–5.3)
Sodium: 140 mEq/L (ref 135–145)

## 2012-10-26 NOTE — Assessment & Plan Note (Signed)
Add a probiotics, fiber supplements, can use MOM and prune juice prna nd Miralax daily as needed

## 2012-10-26 NOTE — Assessment & Plan Note (Signed)
Tolerating Coumadin 

## 2012-10-26 NOTE — Assessment & Plan Note (Signed)
stable °

## 2012-10-26 NOTE — Assessment & Plan Note (Signed)
Tolerating Pravastatin 

## 2012-10-26 NOTE — Progress Notes (Signed)
Patient ID: [name], female   DOB: 01/10/1951, 62 y.o.   MRN: [id number] [name] [id number] 1950-08-07 10/26/2012      Progress Note-Follow Up  Subjective  Chief Complaint  Chief Complaint  Patient presents with  . Follow-up    4 week    HPI  Patient is a 62 year old Caucasian female who is in today for followup. She is not having any trouble with headaches or fevers, rash or tick bite. Her titers were negative. She's complaining of some bruising in her left elbow but does not remember any injury. Continues to struggle with some low-grade constipation having to strain to move her bowels every few days. MiraLax as needed has been marginally helpful. No chest pain no palpitations, shortness of breath, GI or GU complaints  Past Medical History  Diagnosis Date  . Anemia   . Anxiety   . Depression   . Diverticulosis   . Elevated cholesterol   . Obesity   . THYROMEGALY 07/22/2010  . Overweight(278.02) 03/11/2010  . Mixed hyperlipidemia 03/11/2010  . MEASLES, HX OF 03/11/2010  . KNEE PAIN, RIGHT 05/28/2010  . HYPERTRIGLYCERIDEMIA 03/11/2010  . HEART MURMUR, HX OF 03/11/2010  . FIBROIDS, UTERUS 03/11/2010  . Diarrhea 08/05/2010  . CONSTIPATION 03/11/2010  . CHICKENPOX, HX OF 03/11/2010  . BIPOLAR AFFECTIVE DISORDER 03/11/2010  . AORTIC VALVE REPLACEMENT, HX OF 03/11/2010  . ANEMIA 05/28/2010  . Acute bronchitis 07/22/2010  . Abdominal pain, generalized 03/19/2010  . Peripheral edema 11/18/2010  . Bacterial vaginitis 11/18/2010  . Hematuria 12/18/2010  . Diverticulitis 12/18/2010  . Hyperthyroidism 12/18/2010  . Fatigue 12/30/2010  . Renal insufficiency 12/30/2010  . Grave's disease 8-12  . Arm lesion 03/23/2011  . UTI (lower urinary tract infection) 08/14/2011  . Breast pain in female 11/17/2011  . Chest pain 11/17/2011  . Hematoma 12/18/2011  . Cervical cancer 03/22/2012  . Cervical cancer screening 03/22/2012  . Foot pain, left 03/22/2012  . Foot fracture, left 03/22/2012  .  Tick bite of back 10/15/2012    Past Surgical History  Procedure Laterality Date  . Appendectomy      Required revision for EColi infection, required recurrent packing  . Bowel obstruction      Requiring adhesions to be removed  . Aortic valve replacement    . Open heart with a cardiac aneurysm repair    . Cholecystectomy    . Tubal ligation    . Varicose vein surgery b/l legs    . Childhood exploratory surgery of heart    . Hernia repair  08-16-10  . Abdominal hysterectomy      sb/l spo, total    Family History  Problem Relation Age of Onset  . Scoliosis Mother   . Arthritis Mother     Rheumatoid  . Aneurysm Mother     heart  . Alcohol abuse Father   . Depression Sister   . Depression Brother   . Alcohol abuse Brother   . Cancer Maternal Grandmother     colon  . Diabetes Maternal Grandfather   . Heart disease Maternal Grandfather   . Alcohol abuse Maternal Grandfather   . Depression Paternal Grandmother   . Diabetes Paternal Grandmother   . Depression Paternal Grandfather   . Diabetes Paternal Grandfather   . Depression Sister   . Alcohol abuse Brother   . Depression Brother   . Heart disease Brother     History   Social History  . Marital Status: Married  Spouse Name: N/A    Number of Children: N/A  . Years of Education: N/A   Occupational History  . Not on file.   Social History Main Topics  . Smoking status: Former Smoker    Quit date: 05/26/1990  . Smokeless tobacco: Never Used  . Alcohol Use: 0.0 oz/week    0 drink(s) per week     Comment: very serious occasions  . Drug Use: No  . Sexually Active: No   Other Topics Concern  . Not on file   Social History Narrative  . No narrative on file    Current Outpatient Prescriptions on File Prior to Visit  Medication Sig Dispense Refill  . acetaminophen (TYLENOL) 325 MG tablet Take 650 mg by mouth every 6 (six) hours as needed.       Marland Kitchen acetaminophen-codeine (TYLENOL #3) 300-30 MG per tablet  Take 1 tablet by mouth every 6 (six) hours as needed. Cough/pain      . aspirin 81 MG tablet Take 81 mg by mouth daily.       . B Complex-C (B-COMPLEX WITH VITAMIN C) tablet Take 1 tablet by mouth daily.      . clonazePAM (KLONOPIN) 0.5 MG tablet Take 0.5 mg by mouth 2 (two) times daily.      . cyanocobalamin (,VITAMIN B-12,) 1000 MCG/ML injection Inject 1,000 mcg into the muscle every 30 (thirty) days. Administered in Physician's office Last dose 11/30/2011      . Docusate Calcium (STOOL SOFTENER PO) Take 4 capsules by mouth at bedtime.       . fenofibrate 160 MG tablet Take 160 mg by mouth daily.       . Ferrous Fumarate 324 MG TABS Take 1 tablet (106 mg of iron total) by mouth daily.  30 each  1  . Ferrous Fumarate-Folic Acid (HEMOCYTE-F) 324-1 MG TABS Take 1 tablet by mouth daily.  90 each  1  . furosemide (LASIX) 20 MG tablet Take 1 tablet (20 mg total) by mouth daily as needed.  90 tablet  0  . lamoTRIgine (LAMICTAL) 150 MG tablet Take 150 mg by mouth daily.       Marland Kitchen omega-3 acid ethyl esters (LOVAZA) 1 G capsule Take 2 g by mouth 2 (two) times daily.      . pantoprazole (PROTONIX) 40 MG tablet TAKE 1 TABLET EVERY DAY  30 tablet  5  . pravastatin (PRAVACHOL) 40 MG tablet Take 1 tablet (40 mg total) by mouth at bedtime.  30 tablet  1  . Probiotic Product (ALIGN) 4 MG CAPS Take 1 capsule by mouth daily.  30 capsule  0  . Propylene Glycol (SYSTANE BALANCE) 0.6 % SOLN Apply 1 drop to eye daily.      . QUEtiapine (SEROQUEL) 200 MG tablet Take 400 mg by mouth at bedtime. 2- 200mg  tablets at bedtime      . venlafaxine XR (EFFEXOR-XR) 150 MG 24 hr capsule Take 150 mg by mouth daily.      Marland Kitchen warfarin (COUMADIN) 5 MG tablet Take 5 mg by mouth daily. 1/2 to 1 tab: alternating days      . nitroGLYCERIN (NITROSTAT) 0.4 MG SL tablet Place 1 tablet (0.4 mg total) under the tongue every 5 (five) minutes as needed for chest pain.  25 tablet  3  . [DISCONTINUED] methimazole (TAPAZOLE) 5 MG tablet Take 1 tablet  (5 mg total) by mouth 3 (three) times daily.  30 tablet  2   No current facility-administered medications  on file prior to visit.    Allergies  Allergen Reactions  . Sulfonamide Derivatives     headaches    Review of Systems  Review of Systems  Constitutional: Positive for malaise/fatigue. Negative for fever.  HENT: Negative for congestion.   Eyes: Negative for discharge.  Respiratory: Negative for shortness of breath.   Cardiovascular: Negative for chest pain, palpitations and leg swelling.  Gastrointestinal: Positive for constipation. Negative for nausea, abdominal pain and diarrhea.  Genitourinary: Negative for dysuria.  Musculoskeletal: Negative for falls.  Skin: Negative for rash.  Neurological: Negative for loss of consciousness and headaches.  Endo/Heme/Allergies: Negative for polydipsia. Bruises/bleeds easily.  Psychiatric/Behavioral: Negative for depression and suicidal ideas. The patient is not nervous/anxious and does not have insomnia.     Objective  BP 122/82  Pulse 63  Temp(Src) 98.2 F (36.8 C) (Oral)  Ht 5\' 6"  (1.676 m)  Wt 210 lb 1.9 oz (95.31 kg)  BMI 33.93 kg/m2  SpO2 98%  Physical Exam  Physical Exam  Constitutional: She is oriented to person, place, and time and well-developed, well-nourished, and in no distress. No distress.  HENT:  Head: Normocephalic and atraumatic.  Eyes: Conjunctivae are normal.  Neck: Neck supple. No thyromegaly present.  Cardiovascular: Normal rate and regular rhythm.   Murmur heard. Pulmonary/Chest: Effort normal and breath sounds normal. She has no wheezes.  Abdominal: She exhibits no distension and no mass.  Musculoskeletal: She exhibits no edema.  Lymphadenopathy:    She has no cervical adenopathy.  Neurological: She is alert and oriented to person, place, and time.  Skin: Skin is warm and dry. No rash noted. She is not diaphoretic.  Psychiatric: Memory, affect and judgment normal.    Lab Results  Component  Value Date   TSH 2.643 09/23/2012   Lab Results  Component Value Date   WBC 5.0 09/23/2012   HGB 12.8 09/23/2012   HCT 36.5 09/23/2012   MCV 86.9 09/23/2012   PLT 256 09/23/2012   Lab Results  Component Value Date   CREATININE 1.26* 10/25/2012   BUN 22 10/25/2012   NA 140 10/25/2012   K 4.4 10/25/2012   CL 103 10/25/2012   CO2 27 10/25/2012   Lab Results  Component Value Date   ALT 22 09/23/2012   AST 30 09/23/2012   ALKPHOS 32* 09/23/2012   BILITOT 0.5 09/23/2012   Lab Results  Component Value Date   CHOL 166 09/22/2011   Lab Results  Component Value Date   HDL 48.00 09/22/2011   No results found for this basename: LDLCALC   Lab Results  Component Value Date   TRIG 203.0* 09/22/2011   Lab Results  Component Value Date   CHOLHDL 3 09/22/2011     Assessment & Plan  CONSTIPATION Add a probiotics, fiber supplements, can use MOM and prune juice prna nd Miralax daily as needed  Renal insufficiency stable  MIXED HYPERLIPIDEMIA Tolerating Pravastatin  AORTIC VALVE REPLACEMENT, HX OF Tolerating Coumadin

## 2012-11-29 ENCOUNTER — Telehealth: Payer: Self-pay | Admitting: Family Medicine

## 2012-11-29 MED ORDER — FUROSEMIDE 20 MG PO TABS: 20.0000 mg | ORAL_TABLET | Freq: Every day | ORAL | Status: DC | PRN

## 2012-11-29 NOTE — Telephone Encounter (Signed)
Refill- furosemide 20mg  tablet. Take one tablet every day as needed. Qty 90 last fill 5.20.14

## 2012-12-21 ENCOUNTER — Other Ambulatory Visit: Payer: Self-pay | Admitting: Family Medicine

## 2012-12-21 NOTE — Telephone Encounter (Signed)
eScribe request for refill on Toprol XL Last filled - 02.24.14, #30x0 [Not on active medication list] Last OV - 06.02.14 Please advise/SLS

## 2012-12-28 ENCOUNTER — Ambulatory Visit: Payer: BC Managed Care – PPO | Admitting: Family Medicine

## 2012-12-30 ENCOUNTER — Ambulatory Visit (INDEPENDENT_AMBULATORY_CARE_PROVIDER_SITE_OTHER): Payer: BC Managed Care – PPO | Admitting: Family Medicine

## 2012-12-30 ENCOUNTER — Encounter: Payer: Self-pay | Admitting: Family Medicine

## 2012-12-30 VITALS — BP 124/78 | HR 63 | Temp 98.3°F | Ht 66.0 in | Wt 222.0 lb

## 2012-12-30 DIAGNOSIS — E785 Hyperlipidemia, unspecified: Secondary | ICD-10-CM

## 2012-12-30 DIAGNOSIS — R739 Hyperglycemia, unspecified: Secondary | ICD-10-CM

## 2012-12-30 DIAGNOSIS — E079 Disorder of thyroid, unspecified: Secondary | ICD-10-CM

## 2012-12-30 DIAGNOSIS — R609 Edema, unspecified: Secondary | ICD-10-CM

## 2012-12-30 DIAGNOSIS — M25569 Pain in unspecified knee: Secondary | ICD-10-CM

## 2012-12-30 DIAGNOSIS — R319 Hematuria, unspecified: Secondary | ICD-10-CM

## 2012-12-30 DIAGNOSIS — M25561 Pain in right knee: Secondary | ICD-10-CM

## 2012-12-30 DIAGNOSIS — R5383 Other fatigue: Secondary | ICD-10-CM

## 2012-12-30 DIAGNOSIS — D510 Vitamin B12 deficiency anemia due to intrinsic factor deficiency: Secondary | ICD-10-CM

## 2012-12-30 DIAGNOSIS — Z954 Presence of other heart-valve replacement: Secondary | ICD-10-CM

## 2012-12-30 DIAGNOSIS — R5381 Other malaise: Secondary | ICD-10-CM

## 2012-12-30 DIAGNOSIS — R7309 Other abnormal glucose: Secondary | ICD-10-CM

## 2012-12-30 NOTE — Progress Notes (Signed)
Patient ID: [name], female   DOB: January 27, 1951, 62 y.o.   MRN: [id number] [name] [id number] [birth year] 12/30/2012      Progress Note-Follow Up  Subjective  Chief Complaint  Chief Complaint  Patient presents with  . Follow-up    3 month    HPI  62-year-old Caucasian female is in today for followup. She continues to do relatively well. She does continue to complain of persistent fatigue but no other acute complaints. No headache, chest pain, palpitations, shortness of breath, GI or GU concerns. She missed some vitamin B12 doses but is started back on them. Continues to have knee pain he has had a total knee replacement recommended but has not proceeded thus far.  Past Medical History  Diagnosis Date  . Anemia   . Anxiety   . Depression   . Diverticulosis   . Elevated cholesterol   . Obesity   . THYROMEGALY 07/22/2010  . Overweight(278.02) 03/11/2010  . Mixed hyperlipidemia 03/11/2010  . MEASLES, HX OF 03/11/2010  . KNEE PAIN, RIGHT 05/28/2010  . HYPERTRIGLYCERIDEMIA 03/11/2010  . HEART MURMUR, HX OF 03/11/2010  . FIBROIDS, UTERUS 03/11/2010  . Diarrhea 08/05/2010  . CONSTIPATION 03/11/2010  . CHICKENPOX, HX OF 03/11/2010  . BIPOLAR AFFECTIVE DISORDER 03/11/2010  . AORTIC VALVE REPLACEMENT, HX OF 03/11/2010  . ANEMIA 05/28/2010  . Acute bronchitis 07/22/2010  . Abdominal pain, generalized 03/19/2010  . Peripheral edema 11/18/2010  . Bacterial vaginitis 11/18/2010  . Hematuria 12/18/2010  . Diverticulitis 12/18/2010  . Hyperthyroidism 12/18/2010  . Fatigue 12/30/2010  . Renal insufficiency 12/30/2010  . Grave's disease 8-12  . Arm lesion 03/23/2011  . UTI (lower urinary tract infection) 08/14/2011  . Breast pain in female 11/17/2011  . Chest pain 11/17/2011  . Hematoma 12/18/2011  . Cervical cancer 03/22/2012  . Cervical cancer screening 03/22/2012  . Foot pain, left 03/22/2012  . Foot fracture, left 03/22/2012  . Tick bite of back 10/15/2012    Past Surgical History   Procedure Laterality Date  . Appendectomy      Required revision for EColi infection, required recurrent packing  . Bowel obstruction      Requiring adhesions to be removed  . Aortic valve replacement    . Open heart with a cardiac aneurysm repair    . Cholecystectomy    . Tubal ligation    . Varicose vein surgery b/l legs    . Childhood exploratory surgery of heart    . Hernia repair  08-16-10  . Abdominal hysterectomy      sb/l spo, total    Family History  Problem Relation Age of Onset  . Scoliosis Mother   . Arthritis Mother     Rheumatoid  . Aneurysm Mother     heart  . Alcohol abuse Father   . Depression Sister   . Depression Brother   . Alcohol abuse Brother   . Cancer Maternal Grandmother     colon  . Diabetes Maternal Grandfather   . Heart disease Maternal Grandfather   . Alcohol abuse Maternal Grandfather   . Depression Paternal Grandmother   . Diabetes Paternal Grandmother   . Depression Paternal Grandfather   . Diabetes Paternal Grandfather   . Depression Sister   . Alcohol abuse Brother   . Depression Brother   . Heart disease Brother     History   Social History  . Marital Status: Married    Spouse Name: N/A    Number of Children: N/A  .  Years of Education: N/A   Occupational History  . Not on file.   Social History Main Topics  . Smoking status: Former Smoker    Quit date: 05/26/1990  . Smokeless tobacco: Never Used  . Alcohol Use: 0.0 oz/week    0 drink(s) per week     Comment: very serious occasions  . Drug Use: No  . Sexually Active: No   Other Topics Concern  . Not on file   Social History Narrative  . No narrative on file    Current Outpatient Prescriptions on File Prior to Visit  Medication Sig Dispense Refill  . acetaminophen (TYLENOL) 325 MG tablet Take 650 mg by mouth every 6 (six) hours as needed.       Marland Kitchen acetaminophen-codeine (TYLENOL #3) 300-30 MG per tablet Take 1 tablet by mouth every 6 (six) hours as needed.  Cough/pain      . aspirin 81 MG tablet Take 81 mg by mouth daily.       . B Complex-C (B-COMPLEX WITH VITAMIN C) tablet Take 1 tablet by mouth daily.      . clonazePAM (KLONOPIN) 0.5 MG tablet Take 0.5 mg by mouth 2 (two) times daily.      . cyanocobalamin (,VITAMIN B-12,) 1000 MCG/ML injection Inject 1,000 mcg into the muscle every 30 (thirty) days. Administered in Physician's office Last dose 11/30/2011      . Docusate Calcium (STOOL SOFTENER PO) Take 4 capsules by mouth at bedtime.       . fenofibrate 160 MG tablet Take 160 mg by mouth daily.       . Ferrous Fumarate-Folic Acid (HEMOCYTE-F) 324-1 MG TABS Take 1 tablet by mouth daily.  90 each  1  . furosemide (LASIX) 20 MG tablet Take 1 tablet (20 mg total) by mouth daily as needed.  90 tablet  1  . lamoTRIgine (LAMICTAL) 150 MG tablet Take 150 mg by mouth daily.       . metoprolol succinate (TOPROL-XL) 25 MG 24 hr tablet TAKE 1 TABLET (25 MG TOTAL) BY MOUTH DAILY.  30 tablet  6  . omega-3 acid ethyl esters (LOVAZA) 1 G capsule Take 2 g by mouth 2 (two) times daily.      . pantoprazole (PROTONIX) 40 MG tablet TAKE 1 TABLET EVERY DAY  30 tablet  5  . pravastatin (PRAVACHOL) 40 MG tablet Take 1 tablet (40 mg total) by mouth at bedtime.  30 tablet  1  . Probiotic Product (ALIGN) 4 MG CAPS Take 1 capsule by mouth daily.  30 capsule  0  . Propylene Glycol (SYSTANE BALANCE) 0.6 % SOLN Apply 1 drop to eye daily.      . QUEtiapine (SEROQUEL) 200 MG tablet Take 400 mg by mouth at bedtime. 2- 200mg  tablets at bedtime      . venlafaxine XR (EFFEXOR-XR) 150 MG 24 hr capsule Take 150 mg by mouth daily.      Marland Kitchen warfarin (COUMADIN) 5 MG tablet Take 5 mg by mouth daily. 1/2 to 1 tab: alternating days      . nitroGLYCERIN (NITROSTAT) 0.4 MG SL tablet Place 1 tablet (0.4 mg total) under the tongue every 5 (five) minutes as needed for chest pain.  25 tablet  3  . [DISCONTINUED] methimazole (TAPAZOLE) 5 MG tablet Take 1 tablet (5 mg total) by mouth 3 (three) times  daily.  30 tablet  2   No current facility-administered medications on file prior to visit.    Allergies  Allergen  Reactions  . Sulfonamide Derivatives     headaches    Review of Systems  Review of Systems  Constitutional: Positive for malaise/fatigue. Negative for fever and chills.  HENT: Negative for hearing loss, nosebleeds and congestion.   Eyes: Negative for discharge.  Respiratory: Negative for cough, sputum production, shortness of breath and wheezing.   Cardiovascular: Negative for chest pain, palpitations and leg swelling.  Gastrointestinal: Negative for heartburn, nausea, vomiting, abdominal pain, diarrhea, constipation and blood in stool.  Genitourinary: Negative for dysuria, urgency, frequency and hematuria.  Musculoskeletal: Positive for joint pain. Negative for myalgias, back pain and falls.  Skin: Negative for rash.  Neurological: Negative for dizziness, tremors, sensory change, focal weakness, loss of consciousness, weakness and headaches.  Endo/Heme/Allergies: Negative for polydipsia. Does not bruise/bleed easily.  Psychiatric/Behavioral: Negative for depression and suicidal ideas. The patient is not nervous/anxious and does not have insomnia.     Objective  BP 124/78  Pulse 63  Temp(Src) 98.3 F (36.8 C) (Oral)  Ht 5\' 6"  (1.676 m)  Wt 222 lb 0.6 oz (100.717 kg)  BMI 35.86 kg/m2  SpO2 96%  Physical Exam  Physical Exam  Constitutional: She is oriented to person, place, and time and well-developed, well-nourished, and in no distress. No distress.  HENT:  Head: Normocephalic and atraumatic.  Eyes: Conjunctivae are normal.  Neck: Neck supple. No thyromegaly present.  Cardiovascular: Normal rate, regular rhythm and normal heart sounds.   No murmur heard. Pulmonary/Chest: Effort normal and breath sounds normal. She has no wheezes.  Abdominal: She exhibits no distension and no mass.  Musculoskeletal: She exhibits no edema.  Lymphadenopathy:    She has no  cervical adenopathy.  Neurological: She is alert and oriented to person, place, and time.  Skin: Skin is warm and dry. No rash noted. She is not diaphoretic.  Psychiatric: Memory, affect and judgment normal.    Lab Results  Component Value Date   TSH 2.643 09/23/2012   Lab Results  Component Value Date   WBC 5.0 09/23/2012   HGB 12.8 09/23/2012   HCT 36.5 09/23/2012   MCV 86.9 09/23/2012   PLT 256 09/23/2012   Lab Results  Component Value Date   CREATININE 1.26* 10/25/2012   BUN 22 10/25/2012   NA 140 10/25/2012   K 4.4 10/25/2012   CL 103 10/25/2012   CO2 27 10/25/2012   Lab Results  Component Value Date   ALT 22 09/23/2012   AST 30 09/23/2012   ALKPHOS 32* 09/23/2012   BILITOT 0.5 09/23/2012   Lab Results  Component Value Date   CHOL 166 09/22/2011   Lab Results  Component Value Date   HDL 48.00 09/22/2011   No results found for this basename: LDLCALC   Lab Results  Component Value Date   TRIG 203.0* 09/22/2011   Lab Results  Component Value Date   CHOLHDL 3 09/22/2011     Assessment & Plan   KNEE PAIN, RIGHT Cortisone shot recently helped only minimally, is considering a knee replacement  Pernicious anemia vitmain B12 shots continued.  AORTIC VALVE REPLACEMENT, HX OF Stable follows with cardiology  Peripheral edema Mild, elevate feet. Follow DASH diet.

## 2012-12-30 NOTE — Assessment & Plan Note (Signed)
Cortisone shot recently helped only minimally, is considering a knee replacement

## 2012-12-30 NOTE — Patient Instructions (Signed)
Next appt 2-3 months

## 2012-12-31 ENCOUNTER — Telehealth: Payer: Self-pay

## 2012-12-31 LAB — LIPID PANEL
Cholesterol: 172 mg/dL (ref 0–200)
HDL: 58 mg/dL (ref >39)
LDL Cholesterol: 59 mg/dL (ref 0–99)
Total CHOL/HDL Ratio: 3 Ratio
Triglycerides: 277 mg/dL — ABNORMAL HIGH (ref <150)
VLDL: 55 mg/dL — ABNORMAL HIGH (ref 0–40)

## 2012-12-31 LAB — HEPATIC FUNCTION PANEL
ALT: 17 U/L (ref 0–35)
AST: 23 U/L (ref 0–37)
Albumin: 4.5 g/dL (ref 3.5–5.2)
Alkaline Phosphatase: 36 U/L — ABNORMAL LOW (ref 39–117)
Bilirubin, Direct: 0.1 mg/dL (ref 0.0–0.3)
Indirect Bilirubin: 0.3 mg/dL (ref 0.0–0.9)
Total Bilirubin: 0.4 mg/dL (ref 0.3–1.2)
Total Protein: 7 g/dL (ref 6.0–8.3)

## 2012-12-31 LAB — URINALYSIS
Bilirubin Urine: NEGATIVE
Glucose, UA: NEGATIVE mg/dL
Hgb urine dipstick: NEGATIVE
Ketones, ur: NEGATIVE mg/dL
Leukocytes, UA: NEGATIVE
Nitrite: NEGATIVE
Protein, ur: NEGATIVE mg/dL
Specific Gravity, Urine: 1.017 (ref 1.005–1.030)
Urobilinogen, UA: 0.2 mg/dL (ref 0.0–1.0)
pH: 5.5 (ref 5.0–8.0)

## 2012-12-31 LAB — RENAL FUNCTION PANEL
Albumin: 4.5 g/dL (ref 3.5–5.2)
BUN: 23 mg/dL (ref 6–23)
CO2: 27 mEq/L (ref 19–32)
Calcium: 9.5 mg/dL (ref 8.4–10.5)
Chloride: 105 mEq/L (ref 96–112)
Creat: 1.06 mg/dL (ref 0.50–1.10)
Glucose, Bld: 105 mg/dL — ABNORMAL HIGH (ref 70–99)
Phosphorus: 3.2 mg/dL (ref 2.3–4.6)
Potassium: 3.9 mEq/L (ref 3.5–5.3)
Sodium: 140 mEq/L (ref 135–145)

## 2012-12-31 LAB — T4, FREE: Free T4: 0.95 ng/dL (ref 0.80–1.80)

## 2012-12-31 LAB — URINE CULTURE
Colony Count: NO GROWTH
Organism ID, Bacteria: NO GROWTH

## 2012-12-31 LAB — TSH: TSH: 2.006 u[IU]/mL (ref 0.350–4.500)

## 2012-12-31 NOTE — Telephone Encounter (Signed)
Only other finding was elevated Triglycerides which go up with sugar, take krill oil

## 2012-12-31 NOTE — Telephone Encounter (Signed)
Then let [name]  Know her sugar was up slightly and that when she is able she can come in and let us run just a hgba1c or we can run with next blood draw, in the meantime remind her to minimize the carbohydrates, one per meal and brown instead of white carbs

## 2012-12-31 NOTE — Telephone Encounter (Signed)
Per Katrina w/[facility] a HGBa1c can't be added due to a CBC not being done yesterday?

## 2013-01-02 NOTE — Assessment & Plan Note (Signed)
Stable- follows with cardiology.

## 2013-01-02 NOTE — Assessment & Plan Note (Signed)
vitmain B12 shots continued.

## 2013-01-02 NOTE — Assessment & Plan Note (Signed)
Mild, elevate feet. Follow DASH diet.

## 2013-01-03 NOTE — Telephone Encounter (Signed)
Pt informed and voiced understanding

## 2013-01-04 ENCOUNTER — Ambulatory Visit (INDEPENDENT_AMBULATORY_CARE_PROVIDER_SITE_OTHER): Payer: BC Managed Care – PPO | Admitting: Endocrinology

## 2013-01-04 ENCOUNTER — Encounter: Payer: Self-pay | Admitting: Endocrinology

## 2013-01-04 VITALS — BP 138/74 | HR 65 | Ht 68.0 in | Wt 221.0 lb

## 2013-01-04 DIAGNOSIS — E0590 Thyrotoxicosis, unspecified without thyrotoxic crisis or storm: Secondary | ICD-10-CM

## 2013-01-04 NOTE — Progress Notes (Signed)
Subjective:    Patient ID: [name], female    DOB: Aug 11, 1950, 62 y.o.   MRN: [id number]  HPI Pt returns for f/u of hyperthyroidism (dx'ed 2012; in the setting of recent amiodarone rx (which she had to d/c, due to renal insuff); [place] was normal; tapazole was stopped in early 2013, with plans for i-131 rx, when tsh becomes abnormal.  However, TSH has stayed normal.  pt states she feels well in general, except for tremor. Past Medical History  Diagnosis Date  . Anemia   . Anxiety   . Depression   . Diverticulosis   . Elevated cholesterol   . Obesity   . THYROMEGALY 07/22/2010  . Overweight(278.02) 03/11/2010  . Mixed hyperlipidemia 03/11/2010  . MEASLES, HX OF 03/11/2010  . KNEE PAIN, RIGHT 05/28/2010  . HYPERTRIGLYCERIDEMIA 03/11/2010  . HEART MURMUR, HX OF 03/11/2010  . FIBROIDS, UTERUS 03/11/2010  . Diarrhea 08/05/2010  . CONSTIPATION 03/11/2010  . CHICKENPOX, HX OF 03/11/2010  . BIPOLAR AFFECTIVE DISORDER 03/11/2010  . AORTIC VALVE REPLACEMENT, HX OF 03/11/2010  . ANEMIA 05/28/2010  . Acute bronchitis 07/22/2010  . Abdominal pain, generalized 03/19/2010  . Peripheral edema 11/18/2010  . Bacterial vaginitis 11/18/2010  . Hematuria 12/18/2010  . Diverticulitis 12/18/2010  . Hyperthyroidism 12/18/2010  . Fatigue 12/30/2010  . Renal insufficiency 12/30/2010  . Grave's disease 8-12  . Arm lesion 03/23/2011  . UTI (lower urinary tract infection) 08/14/2011  . Breast pain in female 11/17/2011  . Chest pain 11/17/2011  . Hematoma 12/18/2011  . Cervical cancer 03/22/2012  . Cervical cancer screening 03/22/2012  . Foot pain, left 03/22/2012  . Foot fracture, left 03/22/2012  . Tick bite of back 10/15/2012    Past Surgical History  Procedure Laterality Date  . Appendectomy      Required revision for EColi infection, required recurrent packing  . Bowel obstruction      Requiring adhesions to be removed  . Aortic valve replacement    . Open heart with a cardiac aneurysm repair    .  Cholecystectomy    . Tubal ligation    . Varicose vein surgery b/l legs    . Childhood exploratory surgery of heart    . Hernia repair  08-16-10  . Abdominal hysterectomy      sb/l spo, total    History   Social History  . Marital Status: Married    Spouse Name: N/A    Number of Children: N/A  . Years of Education: N/A   Occupational History  . Not on file.   Social History Main Topics  . Smoking status: Former Smoker    Quit date: 05/26/1990  . Smokeless tobacco: Never Used  . Alcohol Use: 0.0 oz/week    0 drink(s) per week     Comment: very serious occasions  . Drug Use: No  . Sexual Activity: No   Other Topics Concern  . Not on file   Social History Narrative  . No narrative on file    Current Outpatient Prescriptions on File Prior to Visit  Medication Sig Dispense Refill  . acetaminophen (TYLENOL) 325 MG tablet Take 650 mg by mouth every 6 (six) hours as needed.       Marland Kitchen acetaminophen-codeine (TYLENOL #3) 300-30 MG per tablet Take 1 tablet by mouth every 6 (six) hours as needed. Cough/pain      . aspirin 81 MG tablet Take 81 mg by mouth daily.       . B Complex-C (  B-COMPLEX WITH VITAMIN C) tablet Take 1 tablet by mouth daily.      . clonazePAM (KLONOPIN) 0.5 MG tablet Take 0.5 mg by mouth 2 (two) times daily.      . cyanocobalamin (,VITAMIN B-12,) 1000 MCG/ML injection Inject 1,000 mcg into the muscle every 30 (thirty) days. Administered in Physician's office Last dose 11/30/2011      . Docusate Calcium (STOOL SOFTENER PO) Take 4 capsules by mouth at bedtime.       . fenofibrate 160 MG tablet Take 160 mg by mouth daily.       . Ferrous Fumarate-Folic Acid (HEMOCYTE-F) 324-1 MG TABS Take 1 tablet by mouth daily.  90 each  1  . furosemide (LASIX) 20 MG tablet Take 1 tablet (20 mg total) by mouth daily as needed.  90 tablet  1  . lamoTRIgine (LAMICTAL) 150 MG tablet Take 150 mg by mouth daily.       . metoprolol succinate (TOPROL-XL) 25 MG 24 hr tablet TAKE 1 TABLET  (25 MG TOTAL) BY MOUTH DAILY.  30 tablet  6  . omega-3 acid ethyl esters (LOVAZA) 1 G capsule Take 2 g by mouth 2 (two) times daily.      . pantoprazole (PROTONIX) 40 MG tablet TAKE 1 TABLET EVERY DAY  30 tablet  5  . pravastatin (PRAVACHOL) 40 MG tablet Take 1 tablet (40 mg total) by mouth at bedtime.  30 tablet  1  . Probiotic Product (ALIGN) 4 MG CAPS Take 1 capsule by mouth daily.  30 capsule  0  . Propylene Glycol (SYSTANE BALANCE) 0.6 % SOLN Apply 1 drop to eye daily.      . QUEtiapine (SEROQUEL) 200 MG tablet Take 400 mg by mouth at bedtime. 2- 200mg  tablets at bedtime      . venlafaxine XR (EFFEXOR-XR) 150 MG 24 hr capsule Take 150 mg by mouth daily.      Marland Kitchen warfarin (COUMADIN) 5 MG tablet Take 5 mg by mouth daily. 1/2 to 1 tab: alternating days      . nitroGLYCERIN (NITROSTAT) 0.4 MG SL tablet Place 1 tablet (0.4 mg total) under the tongue every 5 (five) minutes as needed for chest pain.  25 tablet  3  . [DISCONTINUED] methimazole (TAPAZOLE) 5 MG tablet Take 1 tablet (5 mg total) by mouth 3 (three) times daily.  30 tablet  2   No current facility-administered medications on file prior to visit.    Allergies  Allergen Reactions  . Sulfonamide Derivatives     headaches    Family History  Problem Relation Age of Onset  . Scoliosis Mother   . Arthritis Mother     Rheumatoid  . Aneurysm Mother     heart  . Alcohol abuse Father   . Depression Sister   . Depression Brother   . Alcohol abuse Brother   . Cancer Maternal Grandmother     colon  . Diabetes Maternal Grandfather   . Heart disease Maternal Grandfather   . Alcohol abuse Maternal Grandfather   . Depression Paternal Grandmother   . Diabetes Paternal Grandmother   . Depression Paternal Grandfather   . Diabetes Paternal Grandfather   . Depression Sister   . Alcohol abuse Brother   . Depression Brother   . Heart disease Brother     BP 138/74  Pulse 65  Ht 5\' 8"  (1.727 m)  Wt 221 lb (100.245 kg)  BMI 33.61 kg/m2   SpO2 98%  Review of Systems Denies  weight change    Objective:   Physical Exam VITAL SIGNS:  See vs page GENERAL: no distress NECK: There is no palpable thyroid enlargement.  No thyroid nodule is palpable.  No palpable lymphadenopathy at the anterior neck.  Lab Results  Component Value Date   TSH 2.006 12/30/2012       Assessment & Plan:  Hyperthyroidism, in the context of amiodarone, resolved.  She is at some risk for recurrent hyperthyroidism, but this can be monitored at her routine annual checkups.

## 2013-01-04 NOTE — Progress Notes (Signed)
Quick Note:  Patient Informed and voiced understanding ______ 

## 2013-01-04 NOTE — Patient Instructions (Addendum)
Please continue to have your thyroid checked as part of your annual checkup with dr Abner Greenspan. Please come back here if it becomes abnormal again.

## 2013-01-06 ENCOUNTER — Other Ambulatory Visit: Payer: Self-pay

## 2013-01-06 DIAGNOSIS — Z1231 Encounter for screening mammogram for malignant neoplasm of breast: Secondary | ICD-10-CM

## 2013-01-18 ENCOUNTER — Ambulatory Visit
Admission: RE | Admit: 2013-01-18 | Discharge: 2013-01-18 | Disposition: A | Discharge status: Home / Self Care | Payer: BC Managed Care – PPO | Source: Ambulatory Visit

## 2013-01-18 DIAGNOSIS — Z1231 Encounter for screening mammogram for malignant neoplasm of breast: Secondary | ICD-10-CM

## 2013-02-02 ENCOUNTER — Telehealth: Payer: Self-pay | Admitting: Family Medicine

## 2013-02-02 NOTE — Telephone Encounter (Signed)
Patient states that she dropped surgical clearance forms off and would like to know when forms would be ready for pickup?

## 2013-02-02 NOTE — Telephone Encounter (Signed)
Patient was transferred to [facility] to schedule but called me back because pt couldn't be seen until 02-18-13.   Patient called me back to see what I could do to help her get in sooner

## 2013-02-02 NOTE — Telephone Encounter (Signed)
Patient called and was very frustrated because she doesn't feel that she needs to come in for an appt. I tried to explain to patient that no doctor would sign a form for surgery clearance without seeing her. That its for the protection of herself and the MD. I said we may have to do an EKG or other testing first.

## 2013-02-02 NOTE — Telephone Encounter (Signed)
Per md call and ask patient to come in for a surgery clearance.  Left a detailed message for patient and asked pt to return my call

## 2013-02-03 ENCOUNTER — Encounter: Payer: Self-pay | Admitting: Family Medicine

## 2013-02-03 ENCOUNTER — Ambulatory Visit (INDEPENDENT_AMBULATORY_CARE_PROVIDER_SITE_OTHER): Payer: BC Managed Care – PPO | Admitting: Family Medicine

## 2013-02-03 VITALS — BP 110/64 | HR 59 | Temp 98.0°F | Ht 66.0 in | Wt 222.0 lb

## 2013-02-03 DIAGNOSIS — D510 Vitamin B12 deficiency anemia due to intrinsic factor deficiency: Secondary | ICD-10-CM

## 2013-02-03 DIAGNOSIS — M25569 Pain in unspecified knee: Secondary | ICD-10-CM

## 2013-02-03 DIAGNOSIS — IMO0001 Reserved for inherently not codable concepts without codable children: Secondary | ICD-10-CM

## 2013-02-03 DIAGNOSIS — E039 Hypothyroidism, unspecified: Secondary | ICD-10-CM

## 2013-02-03 DIAGNOSIS — N289 Disorder of kidney and ureter, unspecified: Secondary | ICD-10-CM

## 2013-02-03 DIAGNOSIS — R030 Elevated blood-pressure reading, without diagnosis of hypertension: Secondary | ICD-10-CM

## 2013-02-03 DIAGNOSIS — E0590 Thyrotoxicosis, unspecified without thyrotoxic crisis or storm: Secondary | ICD-10-CM

## 2013-02-03 DIAGNOSIS — Z23 Encounter for immunization: Secondary | ICD-10-CM

## 2013-02-03 DIAGNOSIS — Z01818 Encounter for other preprocedural examination: Secondary | ICD-10-CM | POA: Insufficient documentation

## 2013-02-03 DIAGNOSIS — M25561 Pain in right knee: Secondary | ICD-10-CM

## 2013-02-03 LAB — HEPATIC FUNCTION PANEL
ALT: 21 U/L (ref 0–35)
AST: 26 U/L (ref 0–37)
Albumin: 4.6 g/dL (ref 3.5–5.2)
Alkaline Phosphatase: 34 U/L — ABNORMAL LOW (ref 39–117)
Bilirubin, Direct: 0.1 mg/dL (ref 0.0–0.3)
Indirect Bilirubin: 0.4 mg/dL (ref 0.0–0.9)
Total Bilirubin: 0.5 mg/dL (ref 0.3–1.2)
Total Protein: 7.1 g/dL (ref 6.0–8.3)

## 2013-02-03 LAB — CBC
HCT: 35.2 % — ABNORMAL LOW (ref 36.0–46.0)
Hemoglobin: 11.8 g/dL — ABNORMAL LOW (ref 12.0–15.0)
MCH: 29.9 pg (ref 26.0–34.0)
MCHC: 33.5 g/dL (ref 30.0–36.0)
MCV: 89.1 fL (ref 78.0–100.0)
Platelets: 281 10*3/uL (ref 150–400)
RBC: 3.95 MIL/uL (ref 3.87–5.11)
RDW: 14.2 % (ref 11.5–15.5)
WBC: 5.5 10*3/uL (ref 4.0–10.5)

## 2013-02-03 LAB — RENAL FUNCTION PANEL
Albumin: 4.6 g/dL (ref 3.5–5.2)
BUN: 21 mg/dL (ref 6–23)
CO2: 28 mEq/L (ref 19–32)
Calcium: 9.8 mg/dL (ref 8.4–10.5)
Chloride: 103 mEq/L (ref 96–112)
Creat: 1.43 mg/dL — ABNORMAL HIGH (ref 0.50–1.10)
Glucose, Bld: 66 mg/dL — ABNORMAL LOW (ref 70–99)
Phosphorus: 3.4 mg/dL (ref 2.3–4.6)
Potassium: 4.7 mEq/L (ref 3.5–5.3)
Sodium: 138 mEq/L (ref 135–145)

## 2013-02-03 NOTE — Patient Instructions (Addendum)
Colace/Docusate is a stool softener Sennakot/Senna is a laxative  Try taking either alone or both together daily can take 2 of each Senna S or Pericolace contain both.   Constipation, Adult Constipation is when a person has fewer than 3 bowel movements a week; has difficulty having a bowel movement; or has stools that are dry, hard, or larger than normal. As people grow older, constipation is more common. If you try to fix constipation with medicines that make you have a bowel movement (laxatives), the problem may get worse. Long-term laxative use may cause the muscles of the colon to become weak. A low-fiber diet, not taking in enough fluids, and taking certain medicines may make constipation worse. CAUSES   Certain medicines, such as antidepressants, pain medicine, iron supplements, antacids, and water pills.   Certain diseases, such as diabetes, irritable bowel syndrome (IBS), thyroid disease, or depression.   Not drinking enough water.   Not eating enough fiber-rich foods.   Stress or travel.  Lack of physical activity or exercise.  Not going to the restroom when there is the urge to have a bowel movement.  Ignoring the urge to have a bowel movement.  Using laxatives too much. SYMPTOMS   Having fewer than 3 bowel movements a week.   Straining to have a bowel movement.   Having hard, dry, or larger than normal stools.   Feeling full or bloated.   Pain in the lower abdomen.  Not feeling relief after having a bowel movement. DIAGNOSIS  Your caregiver will take a medical history and perform a physical exam. Further testing may be done for severe constipation. Some tests may include:   A barium enema X-ray to examine your rectum, colon, and sometimes, your small intestine.  A sigmoidoscopy to examine your lower colon.  A colonoscopy to examine your entire colon. TREATMENT  Treatment will depend on the severity of your constipation and what is causing it. Some  dietary treatments include drinking more fluids and eating more fiber-rich foods. Lifestyle treatments may include regular exercise. If these diet and lifestyle recommendations do not help, your caregiver may recommend taking over-the-counter laxative medicines to help you have bowel movements. Prescription medicines may be prescribed if over-the-counter medicines do not work.  HOME CARE INSTRUCTIONS   Increase dietary fiber in your diet, such as fruits, vegetables, whole grains, and beans. Limit high-fat and processed sugars in your diet, such as [place] fries, hamburgers, cookies, candies, and soda.   A fiber supplement may be added to your diet if you cannot get enough fiber from foods.   Drink enough fluids to keep your urine clear or pale yellow.   Exercise regularly or as directed by your caregiver.   Go to the restroom when you have the urge to go. Do not hold it.  Only take medicines as directed by your caregiver. Do not take other medicines for constipation without talking to your caregiver first. SEEK IMMEDIATE MEDICAL CARE IF:   You have bright red blood in your stool.   Your constipation lasts for more than 4 days or gets worse.   You have abdominal or rectal pain.   You have thin, pencil-like stools.  You have unexplained weight loss. MAKE SURE YOU:   Understand these instructions.  Will watch your condition.  Will get help right away if you are not doing well or get worse. Document Released: 02/08/2004 Document Revised: 08/04/2011 Document Reviewed: 04/15/2011 [facility] Patient Information 2014 [place], [place].

## 2013-02-03 NOTE — Assessment & Plan Note (Addendum)
No recent concerns, no illness, chest pain. Is hoping to proceed with Right TKR with Dr Jene Every at [facility]. After lab review and exam patient is medically cleared pending any clinical changes. Has been cleared by cardiology

## 2013-02-04 ENCOUNTER — Encounter: Payer: Self-pay | Admitting: Family Medicine

## 2013-02-04 LAB — TSH: TSH: 2.778 u[IU]/mL (ref 0.350–4.500)

## 2013-02-04 NOTE — Assessment & Plan Note (Signed)
Continues vitamin B supplementation but mild anemia has returned. MCV noted to be lower will have her supplement with Hemocyte F 1 tab dialy in preparation of her coming surgery.

## 2013-02-04 NOTE — Assessment & Plan Note (Signed)
Euthyroid with labs today

## 2013-02-04 NOTE — Assessment & Plan Note (Signed)
Creatinine up slightly again, reinforced need for consistent hydration and will monitor

## 2013-02-04 NOTE — Progress Notes (Signed)
Patient ID: [name], female   DOB: [birth year], 62 y.o.   MRN: [id number] [name] [id number] 1951/02/06 02/04/2013      Progress Note-Follow Up  Subjective  Chief Complaint  Chief Complaint  Patient presents with  . surgery clearance  . Injections    flu    HPI  Patient is a 62 year old Caucasian female who is in today for preop clearance. She is hoping to proceed as quickly as possible with a right total knee replacement. She follows with Dr. Shelle Iron at [facility]. Pain and debility are worsening. Otherwise she feels well. She denies any recent illness. No significant complaints of headache, chest pain, palpitations, shortness of breath, GI or GU concerns. Taking medications as prescribed. Is having very low-grade constipation at times. Having to strain to move her bowels every couple of days. No bloody or tarry stool.  Past Medical History  Diagnosis Date  . Anemia   . Anxiety   . Depression   . Diverticulosis   . Elevated cholesterol   . Obesity   . THYROMEGALY 07/22/2010  . Overweight(278.02) 03/11/2010  . Mixed hyperlipidemia 03/11/2010  . MEASLES, HX OF 03/11/2010  . KNEE PAIN, RIGHT 05/28/2010  . HYPERTRIGLYCERIDEMIA 03/11/2010  . HEART MURMUR, HX OF 03/11/2010  . FIBROIDS, UTERUS 03/11/2010  . Diarrhea 08/05/2010  . CONSTIPATION 03/11/2010  . CHICKENPOX, HX OF 03/11/2010  . BIPOLAR AFFECTIVE DISORDER 03/11/2010  . AORTIC VALVE REPLACEMENT, HX OF 03/11/2010  . ANEMIA 05/28/2010  . Acute bronchitis 07/22/2010  . Abdominal pain, generalized 03/19/2010  . Peripheral edema 11/18/2010  . Bacterial vaginitis 11/18/2010  . Hematuria 12/18/2010  . Diverticulitis 12/18/2010  . Hyperthyroidism 12/18/2010  . Fatigue 12/30/2010  . Renal insufficiency 12/30/2010  . Grave's disease 8-12  . Arm lesion 03/23/2011  . UTI (lower urinary tract infection) 08/14/2011  . Breast pain in female 11/17/2011  . Chest pain 11/17/2011  . Hematoma 12/18/2011  . Cervical cancer  03/22/2012  . Cervical cancer screening 03/22/2012  . Foot pain, left 03/22/2012  . Foot fracture, left 03/22/2012  . Tick bite of back 10/15/2012  . Preop examination 02/03/2013    Past Surgical History  Procedure Laterality Date  . Appendectomy      Required revision for EColi infection, required recurrent packing  . Bowel obstruction      Requiring adhesions to be removed  . Aortic valve replacement    . Open heart with a cardiac aneurysm repair    . Cholecystectomy    . Tubal ligation    . Varicose vein surgery b/l legs    . Childhood exploratory surgery of heart    . Hernia repair  08-16-10  . Abdominal hysterectomy      sb/l spo, total    Family History  Problem Relation Age of Onset  . Scoliosis Mother   . Arthritis Mother     Rheumatoid  . Aneurysm Mother     heart  . Alcohol abuse Father   . Depression Sister   . Depression Brother   . Alcohol abuse Brother   . Cancer Maternal Grandmother     colon  . Diabetes Maternal Grandfather   . Heart disease Maternal Grandfather   . Alcohol abuse Maternal Grandfather   . Depression Paternal Grandmother   . Diabetes Paternal Grandmother   . Depression Paternal Grandfather   . Diabetes Paternal Grandfather   . Depression Sister   . Alcohol abuse Brother   . Depression Brother   .  Heart disease Brother     History   Social History  . Marital Status: Married    Spouse Name: N/A    Number of Children: N/A  . Years of Education: N/A   Occupational History  . Not on file.   Social History Main Topics  . Smoking status: Former Smoker    Quit date: 05/26/1990  . Smokeless tobacco: Never Used  . Alcohol Use: 0.0 oz/week    0 drink(s) per week     Comment: very serious occasions  . Drug Use: No  . Sexual Activity: No   Other Topics Concern  . Not on file   Social History Narrative  . No narrative on file    Current Outpatient Prescriptions on File Prior to Visit  Medication Sig Dispense Refill  .  acetaminophen (TYLENOL) 325 MG tablet Take 650 mg by mouth every 6 (six) hours as needed.       Marland Kitchen acetaminophen-codeine (TYLENOL #3) 300-30 MG per tablet Take 1 tablet by mouth every 6 (six) hours as needed. Cough/pain      . aspirin 81 MG tablet Take 81 mg by mouth daily.       . B Complex-C (B-COMPLEX WITH VITAMIN C) tablet Take 1 tablet by mouth daily.      . clonazePAM (KLONOPIN) 0.5 MG tablet Take 0.5 mg by mouth 2 (two) times daily.      . cyanocobalamin (,VITAMIN B-12,) 1000 MCG/ML injection Inject 1,000 mcg into the muscle every 30 (thirty) days. Administered in Physician's office Last dose 11/30/2011      . Docusate Calcium (STOOL SOFTENER PO) Take 4 capsules by mouth at bedtime.       . fenofibrate 160 MG tablet Take 160 mg by mouth daily.       . Ferrous Fumarate-Folic Acid (HEMOCYTE-F) 324-1 MG TABS Take 1 tablet by mouth daily.  90 each  1  . furosemide (LASIX) 20 MG tablet Take 1 tablet (20 mg total) by mouth daily as needed.  90 tablet  1  . lamoTRIgine (LAMICTAL) 150 MG tablet Take 150 mg by mouth daily.       . metoprolol succinate (TOPROL-XL) 25 MG 24 hr tablet TAKE 1 TABLET (25 MG TOTAL) BY MOUTH DAILY.  30 tablet  6  . omega-3 acid ethyl esters (LOVAZA) 1 G capsule Take 2 g by mouth 2 (two) times daily.      . pantoprazole (PROTONIX) 40 MG tablet TAKE 1 TABLET EVERY DAY  30 tablet  5  . pravastatin (PRAVACHOL) 40 MG tablet Take 1 tablet (40 mg total) by mouth at bedtime.  30 tablet  1  . Probiotic Product (ALIGN) 4 MG CAPS Take 1 capsule by mouth daily.  30 capsule  0  . Propylene Glycol (SYSTANE BALANCE) 0.6 % SOLN Apply 1 drop to eye daily.      . QUEtiapine (SEROQUEL) 200 MG tablet Take 400 mg by mouth at bedtime. 2- 200mg  tablets at bedtime      . venlafaxine XR (EFFEXOR-XR) 150 MG 24 hr capsule Take 150 mg by mouth daily.      Marland Kitchen warfarin (COUMADIN) 5 MG tablet Take 5 mg by mouth daily. 1/2 to 1 tab: alternating days      . nitroGLYCERIN (NITROSTAT) 0.4 MG SL tablet Place 1  tablet (0.4 mg total) under the tongue every 5 (five) minutes as needed for chest pain.  25 tablet  3  . [DISCONTINUED] methimazole (TAPAZOLE) 5 MG tablet Take 1  tablet (5 mg total) by mouth 3 (three) times daily.  30 tablet  2   No current facility-administered medications on file prior to visit.    Allergies  Allergen Reactions  . Sulfonamide Derivatives     headaches    Review of Systems  Review of Systems  Constitutional: Negative for fever and malaise/fatigue.  HENT: Negative for congestion.   Eyes: Negative for discharge.  Respiratory: Negative for shortness of breath.   Cardiovascular: Negative for chest pain, palpitations and leg swelling.  Gastrointestinal: Negative for nausea, abdominal pain and diarrhea.  Genitourinary: Negative for dysuria.  Musculoskeletal: Positive for joint pain. Negative for falls.       Right knee  pain  Skin: Negative for rash.  Neurological: Negative for loss of consciousness and headaches.  Endo/Heme/Allergies: Negative for polydipsia.  Psychiatric/Behavioral: Negative for depression and suicidal ideas. The patient is not nervous/anxious and does not have insomnia.     Objective  BP 110/64  Pulse 59  Temp(Src) 98 F (36.7 C) (Oral)  Ht 5\' 6"  (1.676 m)  Wt 222 lb 0.6 oz (100.717 kg)  BMI 35.86 kg/m2  SpO2 98%  Physical Exam  Physical Exam  Constitutional: She is oriented to person, place, and time and well-developed, well-nourished, and in no distress. No distress.  HENT:  Head: Normocephalic and atraumatic.  Right Ear: External ear normal.  Left Ear: External ear normal.  Nose: Nose normal.  Mouth/Throat: Oropharyngeal exudate present.  Eyes: Conjunctivae and EOM are normal. Pupils are equal, round, and reactive to light. Right eye exhibits no discharge. Left eye exhibits no discharge.  Neck: Normal range of motion. Neck supple.  Cardiovascular: Normal rate and regular rhythm.   Murmur heard. Aortic click  Pulmonary/Chest:  Effort normal and breath sounds normal. She has no wheezes.  Abdominal: Soft. Bowel sounds are normal. She exhibits no distension and no mass. There is no tenderness. There is no rebound and no guarding.  Musculoskeletal: Normal range of motion. She exhibits no edema.  Lymphadenopathy:    She has no cervical adenopathy.  Neurological: She is alert and oriented to person, place, and time.  Skin: Skin is warm and dry. No rash noted. She is not diaphoretic.  Psychiatric: Memory, affect and judgment normal.    Lab Results  Component Value Date   TSH 2.778 02/03/2013   Lab Results  Component Value Date   WBC 5.5 02/03/2013   HGB 11.8* 02/03/2013   HCT 35.2* 02/03/2013   MCV 89.1 02/03/2013   PLT 281 02/03/2013   Lab Results  Component Value Date   CREATININE 1.43* 02/03/2013   BUN 21 02/03/2013   NA 138 02/03/2013   K 4.7 02/03/2013   CL 103 02/03/2013   CO2 28 02/03/2013   Lab Results  Component Value Date   ALT 21 02/03/2013   AST 26 02/03/2013   ALKPHOS 34* 02/03/2013   BILITOT 0.5 02/03/2013   Lab Results  Component Value Date   CHOL 172 12/30/2012   Lab Results  Component Value Date   HDL 58 12/30/2012   Lab Results  Component Value Date   LDLCALC 59 12/30/2012   Lab Results  Component Value Date   TRIG 277* 12/30/2012   Lab Results  Component Value Date   CHOLHDL 3.0 12/30/2012     Assessment & Plan  Preop examination No recent concerns, no illness, chest pain. Is hoping to proceed with Right TKR with Dr Jene Every at [facility]. After lab review and  exam patient is medically cleared pending any clinical changes. Has been cleared by cardiology  Hyperthyroidism Euthyroid with labs today  Renal insufficiency Creatinine up slightly again, reinforced need for consistent hydration and will monitor  Pernicious anemia Continues vitamin B supplementation but mild anemia has returned. MCV noted to be lower will have her supplement with Hemocyte F 1 tab dialy in  preparation of her coming surgery.  KNEE PAIN, RIGHT Patient is here to day for pre op clearance, is hoping ot proceed with R TKR with Dr Shelle Iron at [facility] within the next month. Has been cleared by Dr Katrinka Blazing her cardiologist already. I medically cleared today pending any changes in her clinical status. Will discuss the risk benefits or any concerns regarding surgery with surgeon prior to proceeding.

## 2013-02-04 NOTE — Assessment & Plan Note (Signed)
Patient is here to day for pre op clearance, is hoping ot proceed with R TKR with Dr Shelle Iron at [facility] within the next month. Has been cleared by Dr Katrinka Blazing her cardiologist already. I medically cleared today pending any changes in her clinical status. Will discuss the risk benefits or any concerns regarding surgery with surgeon prior to proceeding.

## 2013-02-08 ENCOUNTER — Other Ambulatory Visit: Payer: Self-pay | Admitting: Orthopedic Surgery

## 2013-02-09 ENCOUNTER — Telehealth: Payer: Self-pay | Admitting: *Deleted

## 2013-02-09 NOTE — Telephone Encounter (Signed)
Pt left message that she feels she may have a UTI. "Has a strange feeling when she tries to urinate. Feels like she needs to urinate but is unable to void fully." Pt requests antibiotic for possible UTI. I advised pt that she may need appointment for urinalysis / culture but pt wants to ask Provider first.  Please advise.

## 2013-02-09 NOTE — Telephone Encounter (Signed)
So yes my policy is they do not have to wait to see me but I do have to have a urine sample before I will prescribe so she can come in in am and give [place] a urinalysis with c&s. Tonight can use Azo for pain and start a probiotic

## 2013-02-09 NOTE — Telephone Encounter (Signed)
Attempted to reach pt and left message that I will call her back in the morning.

## 2013-02-10 ENCOUNTER — Encounter: Payer: Self-pay | Admitting: Physician Assistant

## 2013-02-10 ENCOUNTER — Ambulatory Visit (INDEPENDENT_AMBULATORY_CARE_PROVIDER_SITE_OTHER): Payer: BC Managed Care – PPO | Admitting: Physician Assistant

## 2013-02-10 ENCOUNTER — Telehealth: Payer: Self-pay | Admitting: Family Medicine

## 2013-02-10 VITALS — BP 104/70 | HR 56 | Temp 98.1°F | Resp 16 | Ht 66.0 in | Wt 222.8 lb

## 2013-02-10 DIAGNOSIS — R82998 Other abnormal findings in urine: Secondary | ICD-10-CM

## 2013-02-10 DIAGNOSIS — R8290 Unspecified abnormal findings in urine: Secondary | ICD-10-CM

## 2013-02-10 DIAGNOSIS — N390 Urinary tract infection, site not specified: Secondary | ICD-10-CM

## 2013-02-10 DIAGNOSIS — R3915 Urgency of urination: Secondary | ICD-10-CM

## 2013-02-10 DIAGNOSIS — R39198 Other difficulties with micturition: Secondary | ICD-10-CM

## 2013-02-10 HISTORY — DX: Urinary tract infection, site not specified: N39.0

## 2013-02-10 LAB — POCT URINALYSIS DIPSTICK
Glucose, UA: NEGATIVE
Ketones, UA: NEGATIVE
Nitrite, UA: NEGATIVE
Protein, UA: 100
Spec Grav, UA: >=1.03
Urobilinogen, UA: 0.2
pH, UA: 6.5

## 2013-02-10 MED ORDER — AMOXICILLIN-POT CLAVULANATE 500-125 MG PO TABS: ORAL_TABLET | ORAL | Status: DC

## 2013-02-10 NOTE — Telephone Encounter (Signed)
Patient is coming in to see cody today

## 2013-02-10 NOTE — Telephone Encounter (Signed)
Patient Information:  Caller Name: Fidelis  Phone: [phone]  Patient: [name], [name]  Gender: Female  DOB: November 23, 1950  Age: 62 Years  PCP: Danise Edge [facility])  Office Follow Up:  Does the office need to follow up with this patient?: No  Instructions For The Office: N/A  RN Note:  Agreed to be seen.   Symptoms  Reason For Call & Symptoms: Urinary symptoms with frequency, urgency and some dysuria.  Denies hematuria, back or flank pain.  Reviewed Health History In EMR: Yes  Reviewed Medications In EMR: Yes  Reviewed Allergies In EMR: Yes  Reviewed Surgeries / Procedures: Yes  Date of Onset of Symptoms: 02/08/2013  Guideline(s) Used:  Urination Pain - Female  Disposition Per Guideline:   See Today in Office  Reason For Disposition Reached:   Age > 50 years  Advice Given:  Fluids:   Drink extra fluids. Drink 8-10 glasses of liquids a day (Reason: to produce a dilute, non-irritating urine).  Cranberry Juice:   Some people think that drinking cranberry juice may help in fighting urinary tract infections. However, there is no good research that has ever proved this.  Warm Saline SITZ Baths to Reduce Pain:  Sit in a warm saline bath for 20 minutes to cleanse the area and to reduce pain. Add 2 oz. of table salt or baking soda to a tub of water.  Call Back If:  You become worse.  Patient Will Follow Care Advice:  YES  Appointment Scheduled:  02/10/2013 13:15:00 Appointment Scheduled Provider:  Marcelline Mates "Selena Batten"

## 2013-02-10 NOTE — Assessment & Plan Note (Signed)
Urine dipstick + blood, protein, leukocytes.  Due to patient's comorbidities and allergies, will treat with Augmentin BID x 3 days.  Urine sent for culture.  Patient instructed to increase fluid intake.  Daily probiotic.  Daily cranberry supplement.  AZO prn.

## 2013-02-10 NOTE — Patient Instructions (Signed)
Take antibiotic as prescribed.  Increase water intake.  Daily probiotic.  Take a AZO tablet for relief of urgency and discomfort.  Daily cranberry supplement.  Will call you with culture results.  Urinary Tract Infection Urinary tract infections (UTIs) can develop anywhere along your urinary tract. Your urinary tract is your body's drainage system for removing wastes and extra water. Your urinary tract includes two kidneys, two ureters, a bladder, and a urethra. Your kidneys are a pair of bean-shaped organs. Each kidney is about the size of your fist. They are located below your ribs, one on each side of your spine. CAUSES Infections are caused by microbes, which are microscopic organisms, including fungi, viruses, and bacteria. These organisms are so small that they can only be seen through a microscope. Bacteria are the microbes that most commonly cause UTIs. SYMPTOMS  Symptoms of UTIs may vary by age and gender of the patient and by the location of the infection. Symptoms in young women typically include a frequent and intense urge to urinate and a painful, burning feeling in the bladder or urethra during urination. Older women and men are more likely to be tired, shaky, and weak and have muscle aches and abdominal pain. A fever may mean the infection is in your kidneys. Other symptoms of a kidney infection include pain in your back or sides below the ribs, nausea, and vomiting. DIAGNOSIS To diagnose a UTI, your caregiver will ask you about your symptoms. Your caregiver also will ask to provide a urine sample. The urine sample will be tested for bacteria and white blood cells. White blood cells are made by your body to help fight infection. TREATMENT  Typically, UTIs can be treated with medication. Because most UTIs are caused by a bacterial infection, they usually can be treated with the use of antibiotics. The choice of antibiotic and length of treatment depend on your symptoms and the type of  bacteria causing your infection. HOME CARE INSTRUCTIONS  If you were prescribed antibiotics, take them exactly as your caregiver instructs you. Finish the medication even if you feel better after you have only taken some of the medication.  Drink enough water and fluids to keep your urine clear or pale yellow.  Avoid caffeine, tea, and carbonated beverages. They tend to irritate your bladder.  Empty your bladder often. Avoid holding urine for long periods of time.  Empty your bladder before and after sexual intercourse.  After a bowel movement, women should cleanse from front to back. Use each tissue only once. SEEK MEDICAL CARE IF:   You have back pain.  You develop a fever.  Your symptoms do not begin to resolve within 3 days. SEEK IMMEDIATE MEDICAL CARE IF:   You have severe back pain or lower abdominal pain.  You develop chills.  You have nausea or vomiting.  You have continued burning or discomfort with urination. MAKE SURE YOU:   Understand these instructions.  Will watch your condition.  Will get help right away if you are not doing well or get worse. Document Released: 02/19/2005 Document Revised: 11/11/2011 Document Reviewed: 06/20/2011 [facility] Patient Information 2014 [place], [place].

## 2013-02-10 NOTE — Telephone Encounter (Signed)
Attempted to reach pt and left message to return my call. 

## 2013-02-10 NOTE — Progress Notes (Signed)
Patient ID: [name], female   DOB: 09/05/1950, 62 y.o.   MRN: [id number]  Patient presents to clinic today c/o urinary urgency, frequency and dysuria x 5 days.  Patient endorses history of UTI.  Denies fever, chills, nausea, vomiting, abdominal pain, flank pain.  Denies history of kidney stone.  Has not tried anything OTC.  Otherwise patient is feeling fine today.  Past Medical History  Diagnosis Date  . Anemia   . Anxiety   . Depression   . Diverticulosis   . Elevated cholesterol   . Obesity   . THYROMEGALY 07/22/2010  . Overweight(278.02) 03/11/2010  . Mixed hyperlipidemia 03/11/2010  . MEASLES, HX OF 03/11/2010  . KNEE PAIN, RIGHT 05/28/2010  . HYPERTRIGLYCERIDEMIA 03/11/2010  . HEART MURMUR, HX OF 03/11/2010  . FIBROIDS, UTERUS 03/11/2010  . Diarrhea 08/05/2010  . CONSTIPATION 03/11/2010  . CHICKENPOX, HX OF 03/11/2010  . BIPOLAR AFFECTIVE DISORDER 03/11/2010  . AORTIC VALVE REPLACEMENT, HX OF 03/11/2010  . ANEMIA 05/28/2010  . Acute bronchitis 07/22/2010  . Abdominal pain, generalized 03/19/2010  . Peripheral edema 11/18/2010  . Bacterial vaginitis 11/18/2010  . Hematuria 12/18/2010  . Diverticulitis 12/18/2010  . Hyperthyroidism 12/18/2010  . Fatigue 12/30/2010  . Renal insufficiency 12/30/2010  . Grave's disease 8-12  . Arm lesion 03/23/2011  . UTI (lower urinary tract infection) 08/14/2011  . Breast pain in female 11/17/2011  . Chest pain 11/17/2011  . Hematoma 12/18/2011  . Cervical cancer 03/22/2012  . Cervical cancer screening 03/22/2012  . Foot pain, left 03/22/2012  . Foot fracture, left 03/22/2012  . Tick bite of back 10/15/2012  . Preop examination 02/03/2013    Current Outpatient Prescriptions on File Prior to Visit  Medication Sig Dispense Refill  . acetaminophen (TYLENOL) 325 MG tablet Take 650 mg by mouth every 6 (six) hours as needed.       Marland Kitchen acetaminophen-codeine (TYLENOL #3) 300-30 MG per tablet Take 1 tablet by mouth every 6 (six) hours as needed. Cough/pain       . aspirin 81 MG tablet Take 81 mg by mouth daily.       . B Complex-C (B-COMPLEX WITH VITAMIN C) tablet Take 1 tablet by mouth daily.      . clonazePAM (KLONOPIN) 0.5 MG tablet Take 0.5 mg by mouth 2 (two) times daily.      . cyanocobalamin (,VITAMIN B-12,) 1000 MCG/ML injection Inject 1,000 mcg into the muscle every 30 (thirty) days. Administered in Physician's office Last dose 11/30/2011      . Docusate Calcium (STOOL SOFTENER PO) Take 4 capsules by mouth at bedtime.       . fenofibrate 160 MG tablet Take 160 mg by mouth daily.       . Ferrous Fumarate-Folic Acid (HEMOCYTE-F) 324-1 MG TABS Take 1 tablet by mouth daily.  90 each  1  . furosemide (LASIX) 20 MG tablet Take 1 tablet (20 mg total) by mouth daily as needed.  90 tablet  1  . lamoTRIgine (LAMICTAL) 150 MG tablet Take 150 mg by mouth daily.       . metoprolol succinate (TOPROL-XL) 25 MG 24 hr tablet TAKE 1 TABLET (25 MG TOTAL) BY MOUTH DAILY.  30 tablet  6  . nitroGLYCERIN (NITROSTAT) 0.4 MG SL tablet Place 1 tablet (0.4 mg total) under the tongue every 5 (five) minutes as needed for chest pain.  25 tablet  3  . omega-3 acid ethyl esters (LOVAZA) 1 G capsule Take 2 g by mouth 2 (  two) times daily.      . pantoprazole (PROTONIX) 40 MG tablet TAKE 1 TABLET EVERY DAY  30 tablet  5  . pravastatin (PRAVACHOL) 40 MG tablet Take 1 tablet (40 mg total) by mouth at bedtime.  30 tablet  1  . Probiotic Product (ALIGN) 4 MG CAPS Take 1 capsule by mouth daily.  30 capsule  0  . Propylene Glycol (SYSTANE BALANCE) 0.6 % SOLN Apply 1 drop to eye daily.      . QUEtiapine (SEROQUEL) 200 MG tablet Take 400 mg by mouth at bedtime. 2- 200mg  tablets at bedtime      . venlafaxine XR (EFFEXOR-XR) 150 MG 24 hr capsule Take 150 mg by mouth daily.      Marland Kitchen warfarin (COUMADIN) 5 MG tablet Take 5 mg by mouth daily. 1/2 to 1 tab: alternating days      . [DISCONTINUED] methimazole (TAPAZOLE) 5 MG tablet Take 1 tablet (5 mg total) by mouth 3 (three) times daily.  30  tablet  2   No current facility-administered medications on file prior to visit.    Allergies  Allergen Reactions  . Sulfonamide Derivatives     headaches    Family History  Problem Relation Age of Onset  . Scoliosis Mother   . Arthritis Mother     Rheumatoid  . Aneurysm Mother     heart  . Alcohol abuse Father   . Depression Sister   . Depression Brother   . Alcohol abuse Brother   . Cancer Maternal Grandmother     colon  . Diabetes Maternal Grandfather   . Heart disease Maternal Grandfather   . Alcohol abuse Maternal Grandfather   . Depression Paternal Grandmother   . Diabetes Paternal Grandmother   . Depression Paternal Grandfather   . Diabetes Paternal Grandfather   . Depression Sister   . Alcohol abuse Brother   . Depression Brother   . Heart disease Brother     History   Social History  . Marital Status: Married    Spouse Name: N/A    Number of Children: N/A  . Years of Education: N/A   Social History Main Topics  . Smoking status: Former Smoker    Quit date: 05/26/1990  . Smokeless tobacco: Never Used  . Alcohol Use: 0.0 oz/week    0 drink(s) per week     Comment: very serious occasions  . Drug Use: No  . Sexual Activity: No   Other Topics Concern  . None   Social History Narrative  . None   ROS See HPI  Filed Vitals:   02/10/13 1315  BP: 104/70  Pulse: 56  Temp: 98.1 F (36.7 C)  Resp: 16    Physical Exam  Vitals reviewed. Constitutional: She is oriented to person, place, and time and well-developed, well-nourished, and in no distress.  HENT:  Head: Normocephalic and atraumatic.  Eyes: Conjunctivae are normal.  Neck: Neck supple.  Abdominal: Soft. Bowel sounds are normal. There is no tenderness.  Neurological: She is alert and oriented to person, place, and time.  Skin: Skin is warm and dry.     Recent Results (from the past 2160 hour(s))  LIPID PANEL     Status: Abnormal   Collection Time    12/30/12  4:33 PM       Result Value Range   Cholesterol 172  0 - 200 mg/dL   Comment: ATP III Classification:           <  200        mg/dL        Desirable          200 - 239     mg/dL        Borderline High          >= 240        mg/dL        High         Triglycerides 277 (*) <150 mg/dL   HDL 58  >16 mg/dL   Total CHOL/HDL Ratio 3.0     VLDL 55 (*) 0 - 40 mg/dL   LDL Cholesterol 59  0 - 99 mg/dL   Comment:       Total Cholesterol/HDL Ratio:CHD Risk                            Coronary Heart Disease Risk Table                                            Men       Women              1/2 Average Risk              3.4        3.3                  Average Risk              5.0        4.4               2X Average Risk              9.6        7.1               3X Average Risk             23.4       11.0     Use the calculated Patient Ratio above and the CHD Risk table      to determine the patient's CHD Risk.     ATP III Classification (LDL):           < 100        mg/dL         Optimal          100 - 129     mg/dL         Near or Above Optimal          130 - 159     mg/dL         Borderline High          160 - 189     mg/dL         High           > 190        mg/dL         Very High        RENAL FUNCTION PANEL     Status: Abnormal   Collection Time    12/30/12  4:33 PM      Result Value Range   Sodium 140  135 - 145 mEq/L   Potassium 3.9  3.5 - 5.3 mEq/L   Chloride 105  96 - 112 mEq/L   CO2 27  19 - 32 mEq/L   Glucose, Bld 105 (*) 70 - 99 mg/dL   BUN 23  6 - 23 mg/dL   Creat 4.09  8.11 - 9.14 mg/dL   Albumin 4.5  3.5 - 5.2 g/dL   Calcium 9.5  8.4 - 78.2 mg/dL   Phosphorus 3.2  2.3 - 4.6 mg/dL  HEPATIC FUNCTION PANEL     Status: Abnormal   Collection Time    12/30/12  4:33 PM      Result Value Range   Total Bilirubin 0.4  0.3 - 1.2 mg/dL   Bilirubin, Direct 0.1  0.0 - 0.3 mg/dL   Indirect Bilirubin 0.3  0.0 - 0.9 mg/dL   Alkaline Phosphatase 36 (*) 39 - 117 U/L   AST 23  0 - 37 U/L   ALT 17  0 -  35 U/L   Total Protein 7.0  6.0 - 8.3 g/dL   Albumin 4.5  3.5 - 5.2 g/dL  TSH     Status: None   Collection Time    12/30/12  4:33 PM      Result Value Range   TSH 2.006  0.350 - 4.500 uIU/mL  T4, FREE     Status: None   Collection Time    12/30/12  4:33 PM      Result Value Range   Free T4 0.95  0.80 - 1.80 ng/dL  URINALYSIS     Status: None   Collection Time    12/30/12  4:33 PM      Result Value Range   Color, Urine YELLOW  YELLOW   APPearance CLEAR  CLEAR   Specific Gravity, Urine 1.017  1.005 - 1.030   pH 5.5  5.0 - 8.0   Glucose, UA NEG  NEG mg/dL   Bilirubin Urine NEG  NEG   Ketones, ur NEG  NEG mg/dL   Hgb urine dipstick NEG  NEG   Protein, ur NEG  NEG mg/dL   Urobilinogen, UA 0.2  0.0 - 1.0 mg/dL   Nitrite NEG  NEG   Leukocytes, UA NEG  NEG  URINE CULTURE     Status: None   Collection Time    12/30/12  4:33 PM      Result Value Range   Colony Count NO GROWTH     Organism ID, Bacteria NO GROWTH    CBC     Status: Abnormal   Collection Time    02/03/13  2:09 PM      Result Value Range   WBC 5.5  4.0 - 10.5 K/uL   RBC 3.95  3.87 - 5.11 MIL/uL   Hemoglobin 11.8 (*) 12.0 - 15.0 g/dL   HCT 95.6 (*) 21.3 - 08.6 %   MCV 89.1  78.0 - 100.0 fL   MCH 29.9  26.0 - 34.0 pg   MCHC 33.5  30.0 - 36.0 g/dL   RDW 57.8  46.9 - 62.9 %   Platelets 281  150 - 400 K/uL  RENAL FUNCTION PANEL     Status: Abnormal   Collection Time    02/03/13  2:09 PM      Result Value Range   Sodium 138  135 - 145 mEq/L   Potassium 4.7  3.5 - 5.3 mEq/L   Chloride 103  96 - 112 mEq/L   CO2 28  19 - 32 mEq/L   Glucose, Bld 66 (*) 70 - 99 mg/dL   BUN  21  6 - 23 mg/dL   Creat 1.32 (*) 4.40 - 1.10 mg/dL   Albumin 4.6  3.5 - 5.2 g/dL   Calcium 9.8  8.4 - 10.2 mg/dL   Phosphorus 3.4  2.3 - 4.6 mg/dL  HEPATIC FUNCTION PANEL     Status: Abnormal   Collection Time    02/03/13  2:09 PM      Result Value Range   Total Bilirubin 0.5  0.3 - 1.2 mg/dL   Bilirubin, Direct 0.1  0.0 - 0.3 mg/dL    Indirect Bilirubin 0.4  0.0 - 0.9 mg/dL   Alkaline Phosphatase 34 (*) 39 - 117 U/L   AST 26  0 - 37 U/L   ALT 21  0 - 35 U/L   Total Protein 7.1  6.0 - 8.3 g/dL   Albumin 4.6  3.5 - 5.2 g/dL  TSH     Status: None   Collection Time    02/03/13  2:09 PM      Result Value Range   TSH 2.778  0.350 - 4.500 uIU/mL  POCT URINALYSIS DIPSTICK     Status: Abnormal   Collection Time    02/10/13  1:34 PM      Result Value Range   Color, UA gold, dark     Comment: Odorous   Clarity, UA murky     Glucose, UA neg     Bilirubin, UA small     Ketones, UA neg     Spec Grav, UA >=1.030     Blood, UA large +++     pH, UA 6.5     Protein, UA 100 ++     Urobilinogen, UA 0.2     Nitrite, UA neg     Leukocytes, UA moderate (2+)      Assessment/Plan: UTI (urinary tract infection) Urine dipstick + blood, protein, leukocytes.  Due to patient's comorbidities and allergies, will treat with Augmentin BID x 3 days.  Urine sent for culture.  Patient instructed to increase fluid intake.  Daily probiotic.  Daily cranberry supplement.  AZO prn.

## 2013-02-14 ENCOUNTER — Other Ambulatory Visit: Payer: Self-pay | Admitting: Interventional Cardiology

## 2013-02-14 ENCOUNTER — Telehealth: Payer: Self-pay | Admitting: Physician Assistant

## 2013-02-14 DIAGNOSIS — E7800 Pure hypercholesterolemia, unspecified: Secondary | ICD-10-CM

## 2013-02-14 DIAGNOSIS — Z79899 Other long term (current) drug therapy: Secondary | ICD-10-CM

## 2013-02-14 LAB — CULTURE, URINE COMPREHENSIVE: Colony Count: 30000

## 2013-02-14 NOTE — Telephone Encounter (Signed)
Discussed results of urine culture with patient.  Patient is currently asymptomatic.  Encouraged patient to continue making sure she has good water intake daily.

## 2013-02-17 ENCOUNTER — Encounter (HOSPITAL_COMMUNITY): Payer: Self-pay | Admitting: Pharmacy Technician

## 2013-02-17 ENCOUNTER — Ambulatory Visit (HOSPITAL_COMMUNITY)
Admission: RE | Admit: 2013-02-17 | Discharge: 2013-02-17 | Disposition: A | Discharge status: Home / Self Care | Payer: BC Managed Care – PPO | Source: Ambulatory Visit | Attending: Orthopedic Surgery | Admitting: Orthopedic Surgery

## 2013-02-17 ENCOUNTER — Encounter (HOSPITAL_COMMUNITY): Payer: Self-pay

## 2013-02-17 ENCOUNTER — Encounter (HOSPITAL_COMMUNITY)
Admission: RE | Admit: 2013-02-17 | Discharge: 2013-02-17 | Disposition: A | Discharge status: Home / Self Care | Payer: BC Managed Care – PPO | Source: Ambulatory Visit | Attending: Specialist | Admitting: Specialist

## 2013-02-17 DIAGNOSIS — M1710 Unilateral primary osteoarthritis, unspecified knee: Secondary | ICD-10-CM | POA: Insufficient documentation

## 2013-02-17 DIAGNOSIS — Z01812 Encounter for preprocedural laboratory examination: Secondary | ICD-10-CM | POA: Insufficient documentation

## 2013-02-17 DIAGNOSIS — Z01818 Encounter for other preprocedural examination: Secondary | ICD-10-CM | POA: Insufficient documentation

## 2013-02-17 HISTORY — DX: Essential (primary) hypertension: I10

## 2013-02-17 LAB — SURGICAL PCR SCREEN
MRSA, PCR: NEGATIVE
Staphylococcus aureus: NEGATIVE

## 2013-02-17 LAB — URINALYSIS, ROUTINE W REFLEX MICROSCOPIC
Bilirubin Urine: NEGATIVE
Glucose, UA: NEGATIVE mg/dL
Hgb urine dipstick: NEGATIVE
Ketones, ur: NEGATIVE mg/dL
Leukocytes, UA: NEGATIVE
Nitrite: NEGATIVE
Protein, ur: NEGATIVE mg/dL
Specific Gravity, Urine: 1.011 (ref 1.005–1.030)
Urobilinogen, UA: 0.2 mg/dL (ref 0.0–1.0)
pH: 6.5 (ref 5.0–8.0)

## 2013-02-17 LAB — BASIC METABOLIC PANEL
BUN: 21 mg/dL (ref 6–23)
CO2: 25 mEq/L (ref 19–32)
Calcium: 9.6 mg/dL (ref 8.4–10.5)
Chloride: 100 mEq/L (ref 96–112)
Creatinine, Ser: 1.19 mg/dL — ABNORMAL HIGH (ref 0.50–1.10)
GFR calc Af Amer: 56 mL/min — ABNORMAL LOW (ref >90)
GFR calc non Af Amer: 48 mL/min — ABNORMAL LOW (ref >90)
Glucose, Bld: 93 mg/dL (ref 70–99)
Potassium: 4.2 mEq/L (ref 3.5–5.1)
Sodium: 139 mEq/L (ref 135–145)

## 2013-02-17 LAB — CBC
HCT: 34.8 % — ABNORMAL LOW (ref 36.0–46.0)
Hemoglobin: 11.6 g/dL — ABNORMAL LOW (ref 12.0–15.0)
MCH: 30.1 pg (ref 26.0–34.0)
MCHC: 33.3 g/dL (ref 30.0–36.0)
MCV: 90.4 fL (ref 78.0–100.0)
Platelets: 273 10*3/uL (ref 150–400)
RBC: 3.85 MIL/uL — ABNORMAL LOW (ref 3.87–5.11)
RDW: 14 % (ref 11.5–15.5)
WBC: 7.9 10*3/uL (ref 4.0–10.5)

## 2013-02-17 LAB — PROTIME-INR
INR: 2.37 — ABNORMAL HIGH (ref 0.00–1.49)
Prothrombin Time: 25.1 seconds — ABNORMAL HIGH (ref 11.6–15.2)

## 2013-02-17 LAB — ABO/RH: ABO/RH(D): A POS

## 2013-02-17 LAB — APTT: aPTT: 48 seconds — ABNORMAL HIGH (ref 24–37)

## 2013-02-17 NOTE — Patient Instructions (Signed)
[name]  02/17/2013   Your procedure is scheduled on: 02/24/13              Surgery 1030am-100pm   Report to [facility] at0800  AM.  Call this number if you have problems the morning of surgery: [phone]   Remember:   Do not eat food or drink liquids after midnight.   Take these medicines the morning of surgery with A SIP OF WATER:    Do not wear jewelry, make-up or nail polish.  Do not wear lotions, powders, or perfumes.   Do not shave 48 hours prior to surgery.   Do not bring valuables to the hospital.  Contacts, dentures or bridgework may not be worn into surgery.  Leave suitcase in the car. After surgery it may be brought to your room.  For patients admitted to the hospital, checkout time is 11:00 AM the day of  discharge. :   SEE CHG INSTRUCTION SHEET    Please read over the following fact sheets that you were given: MRSA Information, coughing and deep breathing exercises, leg exercises, Blood Transfusioni Fact Sheet, Incentive Spirometry FAct sheet                Failure to comply with these instructions may result in cancellation of your surgery.                Patient Signature ____________________________              Nurse Signature _____________________________

## 2013-02-17 NOTE — Progress Notes (Signed)
EKG 09/28/12 on chart  LOV 09/28/12 with Dr Verdis Prime on chart  12/05/11 Cath report on chart 11/25/11 Stress report on chart

## 2013-02-22 ENCOUNTER — Other Ambulatory Visit: Payer: Self-pay | Admitting: Orthopedic Surgery

## 2013-02-22 NOTE — H&P (Signed)
[name] DOB: 12/14/1950 Single / Language: English / Race: White Female  H&P date 02/22/13  Chief complaint: R knee pain  History of Present Illness The patient is a 62 year old female who comes in today for a preoperative History and Physical. The patient is scheduled for a right total knee arthroplasty to be performed by Dr. Jeffrey C. Beane, MD at [facility] on 02/24/13. Pt with R knee end-stage DJD, interfering with ADLs, refractory to conservative tx including tylenol, activity modifications, quad strengthening, ice, elevation, steroid injections, viscosupplementation, bracing, opioid and non-opioid pain medications. We have obtained pre-op clearance from cardiologist Dr. Henry Smith and PCP Dr. Stacy Blythe, she is currently holding coumadin, bridging with lovenox and has been instructed to continue that through wednesday, the day before surgery. Dr. Beane and the pt mutually agreed to proceed with a total knee replacement. Risks and benefits of the procedure were discussed including stiffness, suboptimal range of motion, persistent pain, infection requiring removal of prosthesis and reinsertion, need for prophylactic antibiotics in the future, for example, dental procedures, possible need for manipulation, revision in the future and also anesthetic complications including DVT, PE, etc. We discussed the perioperative course, time in the hospital, postoperative recovery and the need for elevation to control swelling. We also discussed the predicted range of motion and the probability that squatting and kneeling would be unobtainable in the future. In addition, postoperative anticoagulation was discussed. Provided her illustrated handout and discussed it in detail. They will enroll in the total joint replacement educational forum at the hospital. She reports a recent UTI for which she finished a 3 day course of abx, no ongoing symptoms. She had her pre-op appt at [facility]. She was  still on her coumadin at the time so INR elevated.  Past Medical History Bipolar Disorder Skin Cancer. forehead Anemia Anxiety Disorder Depression Hyperthyroidism Irritable bowel syndrome Psychiatric disorder Chronic fatigue syndrome  Allergies Sulfa Drugs. headache  Family History Rheumatoid Arthritis. mother Severe allergy. mother Chronic Obstructive Lung Disease. mother Heart disease in female family member before age 55 Osteoarthritis. mother Heart Disease. mother Heart disease in female family member before age 65 Suicide. brother  Social History Tobacco use. Former smoker. quit 22 yrs ago Alcohol use. current drinker; drinks wine; only occasionally per week Children. 2 Current work status. retired Illicit drug use. no Drug/Alcohol Rehab (Currently). no Exercise. Exercises weekly; does running / walking and gym / weights Living situation. live with spouse who is 20 yrs older in apt with 20+ steps into apt Marital status. married Drug/Alcohol Rehab (Previously). no Tobacco / smoke exposure. no Post-Surgical Plans. rehab center- planning [facility] Advance Directives. healthcare POA  Medication History Tylenol (325MG Tablet, Oral) Active. Tylenol with Codeine #3 (300-30MG Tablet, Oral) Active. Aspirin (81MG Tablet, Oral) Active. Vitamin B Complex ( Oral) Active. ClonazePAM ODT (0.5MG Tablet Disperse, Oral) Active. Vitamin B 12 ( Oral) Specific dose unknown - Active. Docusate Calcium ( Oral) Specific dose unknown - Active. Ferrous Fumarate (324MG Tablet, Oral) Active. Furosemide (20MG Tablet, Oral) Active. LamoTRIgine (150MG Tablet, Oral) Active. Metoprolol Succinate ER ( Oral) Specific dose unknown - Active. Omega 3 ( Oral) Specific dose unknown - Active. Pantoprazole Sodium (40MG Packet, Oral) Active. Pravastatin Sodium (40MG Tablet, Oral) Active. Probiotic ( Oral) Active. Propylene Glycol (Otic) ( Otic) Specific dose unknown -  Active. QUEtiapine Fumarate (200MG Tablet, Oral) Active. Venlafaxine HCl (150MG Capsule ER, Oral) Active. Coumadin (5MG Tablet, Oral) Active. Nitroglycerin (0.4MG Tab Sublingual, Sublingual) Active. Multiple Vitamin (1 (  one) Oral) Active. Vitamin C (1 (one) Oral) Specific dose unknown - Active.  Past Surgical History Foot Surgery. left Gallbladder Surgery. open Hysterectomy. complete (non-cancerous) Ankle Surgery. left Valve Replacement. aortic aneurysm Inguinal Hernia Repair. laparoscopic: bilateral Appendectomy. Open.  Review of Systems General:Present- Fatigue. Not Present- Chills, Fever, Night Sweats, Weight Gain, Weight Loss and Memory Loss. Skin:Not Present- Hives, Itching, Rash, Eczema and Lesions. HEENT:Not Present- Tinnitus, Headache, Double Vision, Visual Loss, Hearing Loss and Dentures. Respiratory:Present- Shortness of breath with exertion. Not Present- Shortness of breath at rest, Allergies, Coughing up blood and Chronic Cough. Cardiovascular:Not Present- Chest Pain, Racing/skipping heartbeats, Difficulty Breathing Lying Down, Murmur, Swelling and Palpitations. Gastrointestinal:Not Present- Bloody Stool, Heartburn, Abdominal Pain, Vomiting, Nausea, Constipation, Diarrhea, Difficulty Swallowing, Jaundice and Loss of appetitie. Female Genitourinary:Not Present- Blood in Urine, Urinary frequency, Weak urinary stream, Discharge, Flank Pain, Incontinence, Painful Urination, Urgency, Urinary Retention and Urinating at Night. Musculoskeletal:Present- Joint Stiffness, Joint Swelling, Joint Pain and Morning Stiffness. Not Present- Muscle Weakness, Muscle Pain, Back Pain and Spasms. Neurological:Not Present- Tremor, Dizziness, Blackout spells, Paralysis, Difficulty with balance and Weakness. Psychiatric:Present- Insomnia.  Vitals 02/22/2013 9:15 AM Weight: 222 lb Height: 68 in Weight was reported by patient. Height was reported by patient. Body Surface  Area: 2.2 m Body Mass Index: 33.75 kg/m BP: 157/82 (Sitting, Left Arm, Standard)  Physical Exam The physical exam findings are as follows:  General Mental Status - Alert, cooperative and good historian. General Appearance- pleasant and Anxious. Not in acute distress. Orientation- Oriented X3. Build & Nutrition- Well nourished and Well developed. Gait- Use of assistive device and Antalgic.  Head and Neck Head- normocephalic, atraumatic . Neck Global Assessment- supple. no bruit auscultated on the right and no bruit auscultated on the left.  Eye Pupil- Bilateral- Regular and Round. Motion- Bilateral- EOMI.  Chest and Lung Exam Auscultation: Breath sounds:- clear at anterior chest wall and - clear at posterior chest wall. Adventitious sounds:- No Adventitious sounds.  Cardiovascular Auscultation:Rhythm- Regular rate and rhythm. Heart Sounds- S1 WNL and S2 WNL. Murmurs & Other Heart Sounds:Auscultation of the heart reveals - No Murmurs.  Abdomen Palpation/Percussion:Tenderness- Abdomen is non-tender to palpation. Rigidity (guarding)- Abdomen is soft. Auscultation:Auscultation of the abdomen reveals - Bowel sounds normal.  Female Genitourinary Not done, not pertinent to present illness  Musculoskeletal Right Knee: Inspection and Palpation: Tenderness - medial joint line tender to palpation and lateral joint line tender to palpation. no tenderness to palpation of the superior calf, no tenderness to palpation of the pes anserine bursa, no tenderness to palpation of the quadriceps tendon, no tenderness to palpation of the patellar tendon, no tenderness to palpation of the patella, no tenderness to palpation of the fibular head and no tenderness to palpation of the peroneal nerve. Effusion - trace. Tissue tension/texture is - soft. Crepitus - moderate. Pulses - 2+. Sensation - intact to light touch. Skin - Color - no ecchymosis and no  erythema. Strength and Tone: Quadriceps - 5/5. Hamstrings - 5/5. ROM: Flexion: AROM - 100 . Extension: AROM - 0 . Instability - Valgus Laxity at 30 - None. Valgus Laxity at 0 - None. Varus Laxity at 30 - None. Varus Laxity at 0 - None. Lachman - Negative. Anterior Drawer Test - Negative. Posterior Drawer Test - Negative. Deformities/Malalignments/Discrepancies - no deformities noted. Special Tests: McMurray Test (lateral) - negative. McMurray Test (medial) - negative. Patellar Compression Pain - mild pain.  Imaging standing xrays with tricompartmental DJD right knee, worst in the medial compartment with end-stage bone-on-bone   arthrosis. Slight varus alignment.  Assessment & Plan DJD right knee  Pt with end-stage R knee DJD, refractory to conservative tx as listed above. She is scheduled for right TKA by Dr Beane on 10/2. We again discussed surgery itself as well as risks, complications, alternatives including but not limited to DVT, PE, infx, bleeding, failure of procedure, need for secondary procedure, anesthesia risk, even death. Discussed possible need for manipulation. Discussed expected outcomes, post-op protocols, need for PT, DVT and dental ppx, activity modifications and restrictions. Demonstrated flexion/extension exercises to do with PT and on her own. She desires to proceed. She is bridging her coumadin and will stop lovenox the day before surgery. She will hold any multivitamins and ASA. She will remain NPO after MN night before surgery. Will redraw PT/INR AM of surgery. Plan for rehab stay post-op given many steps at home. All her questions were answered. She will follow up 10-14 days post-op for suture removal and xrays.  Plan right total knee replacement   Signed electronically by Sema Stangler M Cordell Guercio, PA-C for Dr. Beane 

## 2013-02-24 ENCOUNTER — Other Ambulatory Visit: Payer: BC Managed Care – PPO

## 2013-02-24 ENCOUNTER — Inpatient Hospital Stay (HOSPITAL_COMMUNITY): Payer: BC Managed Care – PPO | Admitting: Anesthesiology

## 2013-02-24 ENCOUNTER — Inpatient Hospital Stay (HOSPITAL_COMMUNITY)
Admission: RE | Admit: 2013-02-24 | Discharge: 2013-02-28 | DRG: 209 | Disposition: A | Discharge status: Skilled Nursing Facility | Payer: BC Managed Care – PPO | Source: Ambulatory Visit | Attending: Specialist | Admitting: Specialist

## 2013-02-24 ENCOUNTER — Encounter (HOSPITAL_COMMUNITY): Payer: Self-pay | Admitting: Anesthesiology

## 2013-02-24 ENCOUNTER — Inpatient Hospital Stay (HOSPITAL_COMMUNITY): Payer: BC Managed Care – PPO

## 2013-02-24 ENCOUNTER — Encounter (HOSPITAL_COMMUNITY)
Admission: RE | Disposition: A | Discharge status: Skilled Nursing Facility | Payer: Self-pay | Source: Ambulatory Visit | Attending: Specialist

## 2013-02-24 ENCOUNTER — Encounter (HOSPITAL_COMMUNITY): Payer: Self-pay | Admitting: *Deleted

## 2013-02-24 DIAGNOSIS — M1710 Unilateral primary osteoarthritis, unspecified knee: Principal | ICD-10-CM | POA: Diagnosis present

## 2013-02-24 DIAGNOSIS — D62 Acute posthemorrhagic anemia: Secondary | ICD-10-CM | POA: Diagnosis not present

## 2013-02-24 DIAGNOSIS — M1711 Unilateral primary osteoarthritis, right knee: Secondary | ICD-10-CM

## 2013-02-24 DIAGNOSIS — F319 Bipolar disorder, unspecified: Secondary | ICD-10-CM | POA: Diagnosis present

## 2013-02-24 DIAGNOSIS — F411 Generalized anxiety disorder: Secondary | ICD-10-CM | POA: Diagnosis present

## 2013-02-24 HISTORY — PX: TOTAL KNEE ARTHROPLASTY: SHX125

## 2013-02-24 LAB — CBC
HCT: 31.1 % — ABNORMAL LOW (ref 36.0–46.0)
Hemoglobin: 10.4 g/dL — ABNORMAL LOW (ref 12.0–15.0)
MCH: 30.4 pg (ref 26.0–34.0)
MCHC: 33.4 g/dL (ref 30.0–36.0)
MCV: 90.9 fL (ref 78.0–100.0)
Platelets: 238 10*3/uL (ref 150–400)
RBC: 3.42 MIL/uL — ABNORMAL LOW (ref 3.87–5.11)
RDW: 14.1 % (ref 11.5–15.5)
WBC: 13.1 10*3/uL — ABNORMAL HIGH (ref 4.0–10.5)

## 2013-02-24 LAB — CREATININE, SERUM
Creatinine, Ser: 1.03 mg/dL (ref 0.50–1.10)
GFR calc Af Amer: 66 mL/min — ABNORMAL LOW (ref >90)
GFR calc non Af Amer: 57 mL/min — ABNORMAL LOW (ref >90)

## 2013-02-24 LAB — PROTIME-INR
INR: 1.11 (ref 0.00–1.49)
Prothrombin Time: 14.1 seconds (ref 11.6–15.2)

## 2013-02-24 SURGERY — ARTHROPLASTY, KNEE, TOTAL
Anesthesia: General | Site: Knee | Laterality: Right | Wound class: Clean

## 2013-02-24 MED ORDER — GLYCOPYRROLATE 0.2 MG/ML IJ SOLN
INTRAMUSCULAR | Status: DC | PRN
  Administered 2013-02-24: 0.4 mg via INTRAVENOUS
  Administered 2013-02-24: 0.2 mg via INTRAVENOUS

## 2013-02-24 MED ORDER — SODIUM CHLORIDE 0.9 % IR SOLN
Status: DC | PRN
  Administered 2013-02-24: 1000 mL

## 2013-02-24 MED ORDER — BUPIVACAINE-EPINEPHRINE PF 0.5-1:200000 % IJ SOLN
INTRAMUSCULAR | Status: AC
  Filled 2013-02-24: qty 30

## 2013-02-24 MED ORDER — BUPIVACAINE LIPOSOME 1.3 % IJ SUSP
INTRAMUSCULAR | Status: DC | PRN
  Administered 2013-02-24: 20 mL

## 2013-02-24 MED ORDER — CEFAZOLIN SODIUM-DEXTROSE 2-3 GM-% IV SOLR
2.0000 g | Freq: Four times a day (QID) | INTRAVENOUS | Status: AC
  Administered 2013-02-24 – 2013-02-25 (×3): 2 g via INTRAVENOUS
  Filled 2013-02-24 (×3): qty 50

## 2013-02-24 MED ORDER — PROPOFOL 10 MG/ML IV BOLUS
INTRAVENOUS | Status: DC | PRN
  Administered 2013-02-24: 50 mg via INTRAVENOUS
  Administered 2013-02-24: 110 mg via INTRAVENOUS
  Administered 2013-02-24: 50 mg via INTRAVENOUS

## 2013-02-24 MED ORDER — PHENYLEPHRINE HCL 10 MG/ML IJ SOLN
10.0000 mg | INTRAVENOUS | Status: DC | PRN
  Administered 2013-02-24: 30 ug/min via INTRAVENOUS

## 2013-02-24 MED ORDER — LAMOTRIGINE 200 MG PO TABS
200.0000 mg | ORAL_TABLET | Freq: Every day | ORAL | Status: DC
  Administered 2013-02-24 – 2013-02-27 (×4): 200 mg via ORAL
  Filled 2013-02-24 (×5): qty 1

## 2013-02-24 MED ORDER — ONDANSETRON HCL 4 MG/2ML IJ SOLN: 4.0000 mg | Freq: Four times a day (QID) | INTRAMUSCULAR | Status: DC | PRN

## 2013-02-24 MED ORDER — ENOXAPARIN SODIUM 30 MG/0.3ML ~~LOC~~ SOLN
30.0000 mg | Freq: Two times a day (BID) | SUBCUTANEOUS | Status: DC
  Administered 2013-02-25 – 2013-02-28 (×7): 30 mg via SUBCUTANEOUS
  Filled 2013-02-24 (×10): qty 0.3

## 2013-02-24 MED ORDER — ACETAMINOPHEN 325 MG PO TABS
650.0000 mg | ORAL_TABLET | Freq: Four times a day (QID) | ORAL | Status: DC | PRN
  Administered 2013-02-24 – 2013-02-27 (×4): 650 mg via ORAL
  Filled 2013-02-24 (×4): qty 2

## 2013-02-24 MED ORDER — PROMETHAZINE HCL 25 MG/ML IJ SOLN: 6.2500 mg | INTRAMUSCULAR | Status: DC | PRN

## 2013-02-24 MED ORDER — HYDROMORPHONE HCL PF 1 MG/ML IJ SOLN
INTRAMUSCULAR | Status: AC
  Filled 2013-02-24: qty 1

## 2013-02-24 MED ORDER — MENTHOL 3 MG MT LOZG: 1.0000 | LOZENGE | OROMUCOSAL | Status: DC | PRN

## 2013-02-24 MED ORDER — HYDROMORPHONE HCL PF 1 MG/ML IJ SOLN
0.2500 mg | INTRAMUSCULAR | Status: DC | PRN
  Administered 2013-02-24 (×2): 0.5 mg via INTRAVENOUS

## 2013-02-24 MED ORDER — HYDRALAZINE HCL 20 MG/ML IJ SOLN
INTRAMUSCULAR | Status: DC | PRN
  Administered 2013-02-24: 5 mg via INTRAVENOUS

## 2013-02-24 MED ORDER — SACCHAROMYCES BOULARDII 250 MG PO CAPS
250.0000 mg | ORAL_CAPSULE | Freq: Every day | ORAL | Status: DC
  Administered 2013-02-25 – 2013-02-28 (×4): 250 mg via ORAL
  Filled 2013-02-24 (×4): qty 1

## 2013-02-24 MED ORDER — OXYCODONE-ACETAMINOPHEN 5-325 MG PO TABS: 1.0000 | ORAL_TABLET | ORAL | Status: DC | PRN

## 2013-02-24 MED ORDER — NEOSTIGMINE METHYLSULFATE 1 MG/ML IJ SOLN
INTRAMUSCULAR | Status: DC | PRN
  Administered 2013-02-24: 3 mg via INTRAVENOUS

## 2013-02-24 MED ORDER — LACTATED RINGERS IV SOLN
INTRAVENOUS | Status: DC
  Administered 2013-02-24: 13:00:00 via INTRAVENOUS
  Administered 2013-02-24: 1000 mL via INTRAVENOUS

## 2013-02-24 MED ORDER — EPHEDRINE SULFATE 50 MG/ML IJ SOLN
INTRAMUSCULAR | Status: DC | PRN
  Administered 2013-02-24 (×2): 10 mg via INTRAVENOUS

## 2013-02-24 MED ORDER — BUPIVACAINE LIPOSOME 1.3 % IJ SUSP
20.0000 mL | Freq: Once | INTRAMUSCULAR | Status: DC
  Filled 2013-02-24: qty 20

## 2013-02-24 MED ORDER — SUCCINYLCHOLINE CHLORIDE 20 MG/ML IJ SOLN
INTRAMUSCULAR | Status: DC | PRN
  Administered 2013-02-24: 100 mg via INTRAVENOUS

## 2013-02-24 MED ORDER — HYDROMORPHONE HCL PF 1 MG/ML IJ SOLN
INTRAMUSCULAR | Status: DC | PRN
  Administered 2013-02-24: 1 mg via INTRAVENOUS

## 2013-02-24 MED ORDER — BUPIVACAINE-EPINEPHRINE 0.5% -1:200000 IJ SOLN
INTRAMUSCULAR | Status: DC | PRN
  Administered 2013-02-24: 13 mL

## 2013-02-24 MED ORDER — METOPROLOL SUCCINATE ER 25 MG PO TB24
25.0000 mg | ORAL_TABLET | Freq: Every day | ORAL | Status: DC
  Administered 2013-02-25 – 2013-02-28 (×3): 25 mg via ORAL
  Filled 2013-02-24 (×5): qty 1

## 2013-02-24 MED ORDER — VITAMIN C 500 MG PO TABS
500.0000 mg | ORAL_TABLET | Freq: Every day | ORAL | Status: DC
  Administered 2013-02-25 – 2013-02-28 (×3): 500 mg via ORAL
  Filled 2013-02-24 (×4): qty 1

## 2013-02-24 MED ORDER — METOCLOPRAMIDE HCL 10 MG PO TABS: 5.0000 mg | ORAL_TABLET | Freq: Three times a day (TID) | ORAL | Status: DC | PRN

## 2013-02-24 MED ORDER — HYDROMORPHONE HCL PF 1 MG/ML IJ SOLN
1.0000 mg | INTRAMUSCULAR | Status: DC | PRN
  Administered 2013-02-24 – 2013-02-25 (×7): 1 mg via INTRAVENOUS
  Filled 2013-02-24 (×7): qty 1

## 2013-02-24 MED ORDER — ACETAMINOPHEN 650 MG RE SUPP: 650.0000 mg | Freq: Four times a day (QID) | RECTAL | Status: DC | PRN

## 2013-02-24 MED ORDER — ALIGN 4 MG PO CAPS: 1.0000 | ORAL_CAPSULE | Freq: Every day | ORAL | Status: DC

## 2013-02-24 MED ORDER — LACTATED RINGERS IV SOLN: INTRAVENOUS | Status: DC

## 2013-02-24 MED ORDER — CHLORHEXIDINE GLUCONATE 4 % EX LIQD: 60.0000 mL | Freq: Once | CUTANEOUS | Status: DC

## 2013-02-24 MED ORDER — OXYCODONE HCL 5 MG PO TABS
5.0000 mg | ORAL_TABLET | ORAL | Status: DC | PRN
  Administered 2013-02-24 (×2): 5 mg via ORAL
  Administered 2013-02-24 – 2013-02-28 (×7): 10 mg via ORAL
  Administered 2013-02-28: 5 mg via ORAL
  Filled 2013-02-24 (×2): qty 2
  Filled 2013-02-24 (×2): qty 1
  Filled 2013-02-24: qty 2
  Filled 2013-02-24: qty 1
  Filled 2013-02-24 (×5): qty 2

## 2013-02-24 MED ORDER — MIDAZOLAM HCL 5 MG/5ML IJ SOLN
INTRAMUSCULAR | Status: DC | PRN
  Administered 2013-02-24: 2 mg via INTRAVENOUS

## 2013-02-24 MED ORDER — ONDANSETRON HCL 4 MG PO TABS: 4.0000 mg | ORAL_TABLET | Freq: Four times a day (QID) | ORAL | Status: DC | PRN

## 2013-02-24 MED ORDER — ONDANSETRON HCL 4 MG/2ML IJ SOLN
INTRAMUSCULAR | Status: DC | PRN
  Administered 2013-02-24: 4 mg via INTRAVENOUS

## 2013-02-24 MED ORDER — FENOFIBRATE 160 MG PO TABS
160.0000 mg | ORAL_TABLET | Freq: Every day | ORAL | Status: DC
  Administered 2013-02-25 – 2013-02-28 (×4): 160 mg via ORAL
  Filled 2013-02-24 (×6): qty 1

## 2013-02-24 MED ORDER — ROCURONIUM BROMIDE 100 MG/10ML IV SOLN
INTRAVENOUS | Status: DC | PRN
  Administered 2013-02-24: 40 mg via INTRAVENOUS

## 2013-02-24 MED ORDER — ADULT MULTIVITAMIN W/MINERALS CH
1.0000 | ORAL_TABLET | Freq: Every day | ORAL | Status: DC
  Administered 2013-02-25 – 2013-02-28 (×4): 1 via ORAL
  Filled 2013-02-24 (×4): qty 1

## 2013-02-24 MED ORDER — POLYVINYL ALCOHOL 1.4 % OP SOLN
1.0000 [drp] | Freq: Every day | OPHTHALMIC | Status: DC
  Administered 2013-02-24 – 2013-02-27 (×4): 1 [drp] via OPHTHALMIC
  Filled 2013-02-24: qty 15

## 2013-02-24 MED ORDER — SUFENTANIL CITRATE 50 MCG/ML IV SOLN
INTRAVENOUS | Status: DC | PRN
  Administered 2013-02-24: 20 ug via INTRAVENOUS
  Administered 2013-02-24: 5 ug via INTRAVENOUS
  Administered 2013-02-24: 10 ug via INTRAVENOUS
  Administered 2013-02-24: 5 ug via INTRAVENOUS
  Administered 2013-02-24 (×2): 10 ug via INTRAVENOUS
  Administered 2013-02-24: 5 ug via INTRAVENOUS
  Administered 2013-02-24: 10 ug via INTRAVENOUS
  Administered 2013-02-24: 5 ug via INTRAVENOUS
  Administered 2013-02-24: 10 ug via INTRAVENOUS

## 2013-02-24 MED ORDER — METOCLOPRAMIDE HCL 5 MG/ML IJ SOLN: 5.0000 mg | Freq: Three times a day (TID) | INTRAMUSCULAR | Status: DC | PRN

## 2013-02-24 MED ORDER — QUETIAPINE FUMARATE 400 MG PO TABS
400.0000 mg | ORAL_TABLET | Freq: Every day | ORAL | Status: DC
  Administered 2013-02-24 – 2013-02-27 (×4): 400 mg via ORAL
  Filled 2013-02-24 (×6): qty 1

## 2013-02-24 MED ORDER — CEFAZOLIN SODIUM-DEXTROSE 2-3 GM-% IV SOLR
INTRAVENOUS | Status: AC
  Filled 2013-02-24: qty 50

## 2013-02-24 MED ORDER — WARFARIN - PHARMACIST DOSING INPATIENT: Freq: Every day | Status: DC

## 2013-02-24 MED ORDER — FUROSEMIDE 20 MG PO TABS
20.0000 mg | ORAL_TABLET | Freq: Every day | ORAL | Status: DC
  Administered 2013-02-25 – 2013-02-28 (×4): 20 mg via ORAL
  Filled 2013-02-24 (×5): qty 1

## 2013-02-24 MED ORDER — CEFAZOLIN SODIUM-DEXTROSE 2-3 GM-% IV SOLR
2.0000 g | INTRAVENOUS | Status: AC
  Administered 2013-02-24: 2 g via INTRAVENOUS

## 2013-02-24 MED ORDER — SODIUM CHLORIDE 0.9 % IR SOLN
Status: DC | PRN
  Administered 2013-02-24: 11:00:00

## 2013-02-24 MED ORDER — WARFARIN SODIUM 5 MG PO TABS
5.0000 mg | ORAL_TABLET | Freq: Once | ORAL | Status: AC
  Administered 2013-02-24: 17:00:00 5 mg via ORAL
  Filled 2013-02-24: qty 1

## 2013-02-24 MED ORDER — VENLAFAXINE HCL ER 150 MG PO CP24
150.0000 mg | ORAL_CAPSULE | Freq: Every day | ORAL | Status: DC
Start: 2013-02-25 — End: 2013-02-28
  Administered 2013-02-25 – 2013-02-28 (×4): 150 mg via ORAL
  Filled 2013-02-24 (×5): qty 1

## 2013-02-24 MED ORDER — PANTOPRAZOLE SODIUM 40 MG PO TBEC
40.0000 mg | DELAYED_RELEASE_TABLET | Freq: Every day | ORAL | Status: DC
Start: 2013-02-25 — End: 2013-02-28
  Administered 2013-02-25 – 2013-02-28 (×4): 40 mg via ORAL
  Filled 2013-02-24 (×4): qty 1

## 2013-02-24 MED ORDER — KCL IN DEXTROSE-NACL 20-5-0.9 MEQ/L-%-% IV SOLN
INTRAVENOUS | Status: AC
  Administered 2013-02-24: 1000 mL via INTRAVENOUS
  Administered 2013-02-25 (×2): via INTRAVENOUS
  Filled 2013-02-24 (×2): qty 1000

## 2013-02-24 MED ORDER — CLONAZEPAM 0.5 MG PO TABS
0.5000 mg | ORAL_TABLET | Freq: Two times a day (BID) | ORAL | Status: DC
  Administered 2013-02-24 – 2013-02-28 (×8): 0.5 mg via ORAL
  Filled 2013-02-24 (×8): qty 1

## 2013-02-24 MED ORDER — PHENOL 1.4 % MT LIQD: 1.0000 | OROMUCOSAL | Status: DC | PRN

## 2013-02-24 MED ORDER — PROPYLENE GLYCOL 0.6 % OP SOLN: 1.0000 [drp] | Freq: Every day | OPHTHALMIC | Status: DC

## 2013-02-24 SURGICAL SUPPLY — 65 items
BAG ZIPLOCK 12X15 (MISCELLANEOUS) IMPLANT
BANDAGE ELASTIC 4 VELCRO ST LF (GAUZE/BANDAGES/DRESSINGS) ×2 IMPLANT
BANDAGE ELASTIC 6 VELCRO ST LF (GAUZE/BANDAGES/DRESSINGS) ×2 IMPLANT
BANDAGE ESMARK 6X9 LF (GAUZE/BANDAGES/DRESSINGS) ×1 IMPLANT
BLADE SAG 18X100X1.27 (BLADE) ×2 IMPLANT
BLADE SAW SGTL 13.0X1.19X90.0M (BLADE) ×2 IMPLANT
BNDG ESMARK 6X9 LF (GAUZE/BANDAGES/DRESSINGS) ×2
CAPT RP KNEE ×2 IMPLANT
CEMENT HV SMART SET (Cement) ×4 IMPLANT
CLOTH 2% CHLOROHEXIDINE 3PK (PERSONAL CARE ITEMS) ×2 IMPLANT
CLOTH BEACON ORANGE TIMEOUT ST (SAFETY) ×2 IMPLANT
CUFF TOURN SGL QUICK 34 (TOURNIQUET CUFF) ×1
CUFF TRNQT CYL 34X4X40X1 (TOURNIQUET CUFF) ×1 IMPLANT
DERMABOND ADVANCED (GAUZE/BANDAGES/DRESSINGS) ×1
DERMABOND ADVANCED .7 DNX12 (GAUZE/BANDAGES/DRESSINGS) ×1 IMPLANT
DRAPE LG THREE QUARTER DISP (DRAPES) ×2 IMPLANT
DRAPE ORTHO SPLIT 77X108 STRL (DRAPES) ×2
DRAPE POUCH INSTRU U-SHP 10X18 (DRAPES) ×2 IMPLANT
DRAPE SURG ORHT 6 SPLT 77X108 (DRAPES) ×2 IMPLANT
DRAPE U-SHAPE 47X51 STRL (DRAPES) ×2 IMPLANT
DRSG ADAPTIC 3X8 NADH LF (GAUZE/BANDAGES/DRESSINGS) IMPLANT
DRSG AQUACEL AG ADV 3.5X10 (GAUZE/BANDAGES/DRESSINGS) ×2 IMPLANT
DRSG PAD ABDOMINAL 8X10 ST (GAUZE/BANDAGES/DRESSINGS) IMPLANT
DRSG TEGADERM 4X4.75 (GAUZE/BANDAGES/DRESSINGS) ×2 IMPLANT
DURAPREP 26ML APPLICATOR (WOUND CARE) ×2 IMPLANT
ELECT REM PT RETURN 9FT ADLT (ELECTROSURGICAL) ×2
ELECTRODE REM PT RTRN 9FT ADLT (ELECTROSURGICAL) ×1 IMPLANT
EVACUATOR 1/8 PVC DRAIN (DRAIN) ×2 IMPLANT
FACESHIELD LNG OPTICON STERILE (SAFETY) ×6 IMPLANT
GAUZE SPONGE 2X2 8PLY STRL LF (GAUZE/BANDAGES/DRESSINGS) ×1 IMPLANT
GLOVE BIOGEL PI IND STRL 7.5 (GLOVE) ×1 IMPLANT
GLOVE BIOGEL PI IND STRL 8 (GLOVE) ×1 IMPLANT
GLOVE BIOGEL PI INDICATOR 7.5 (GLOVE) ×1
GLOVE BIOGEL PI INDICATOR 8 (GLOVE) ×1
GLOVE SURG SS PI 7.5 STRL IVOR (GLOVE) ×2 IMPLANT
GLOVE SURG SS PI 8.0 STRL IVOR (GLOVE) ×4 IMPLANT
GOWN STRL REIN XL XLG (GOWN DISPOSABLE) ×8 IMPLANT
HANDPIECE INTERPULSE COAX TIP (DISPOSABLE) ×1
IMMOBILIZER KNEE 20 (SOFTGOODS) ×2
IMMOBILIZER KNEE 20 THIGH 36 (SOFTGOODS) ×1 IMPLANT
KIT BASIN OR (CUSTOM PROCEDURE TRAY) ×2 IMPLANT
MANIFOLD NEPTUNE II (INSTRUMENTS) ×2 IMPLANT
NEEDLE HYPO 22GX1.5 SAFETY (NEEDLE) ×4 IMPLANT
NS IRRIG 1000ML POUR BTL (IV SOLUTION) IMPLANT
PACK TOTAL JOINT (CUSTOM PROCEDURE TRAY) ×2 IMPLANT
PADDING CAST COTTON 6X4 STRL (CAST SUPPLIES) IMPLANT
POSITIONER SURGICAL ARM (MISCELLANEOUS) ×2 IMPLANT
SET HNDPC FAN SPRY TIP SCT (DISPOSABLE) ×1 IMPLANT
SPONGE GAUZE 2X2 STER 10/PKG (GAUZE/BANDAGES/DRESSINGS) ×1
SPONGE GAUZE 4X4 12PLY (GAUZE/BANDAGES/DRESSINGS) IMPLANT
SPONGE SURGIFOAM ABS GEL 100 (HEMOSTASIS) IMPLANT
STAPLER VISISTAT (STAPLE) IMPLANT
SUCTION FRAZIER 12FR DISP (SUCTIONS) ×2 IMPLANT
SUT BONE WAX W31G (SUTURE) IMPLANT
SUT MNCRL AB 4-0 PS2 18 (SUTURE) ×2 IMPLANT
SUT VIC AB 1 CT1 27 (SUTURE) ×1
SUT VIC AB 1 CT1 27XBRD ANTBC (SUTURE) ×1 IMPLANT
SUT VIC AB 2-0 CT1 27 (SUTURE) ×3
SUT VIC AB 2-0 CT1 TAPERPNT 27 (SUTURE) ×3 IMPLANT
SYR 20CC LL (SYRINGE) ×4 IMPLANT
TOWEL OR 17X26 10 PK STRL BLUE (TOWEL DISPOSABLE) ×2 IMPLANT
TOWER CARTRIDGE SMART MIX (DISPOSABLE) ×2 IMPLANT
TRAY FOLEY CATH 14FRSI W/METER (CATHETERS) ×2 IMPLANT
WATER STERILE IRR 1500ML POUR (IV SOLUTION) ×4 IMPLANT
WRAP KNEE MAXI GEL POST OP (GAUZE/BANDAGES/DRESSINGS) ×2 IMPLANT

## 2013-02-24 NOTE — Progress Notes (Signed)
Patient ID: [name], female   DOB: 1951-05-15, 62 y.o.   MRN: [id number] Called pharmacy to reconcile lovenox and coumadin postop

## 2013-02-24 NOTE — Interval H&P Note (Signed)
History and Physical Interval Note:  02/24/2013 10:10 AM  [name]  has presented today for surgery, with the diagnosis of RIGHT KNEE DEGENERATIVE JOINT DISEASE  The various methods of treatment have been discussed with the patient and family. After consideration of risks, benefits and other options for treatment, the patient has consented to  Procedure(s): RIGHT TOTAL KNEE ARTHROPLASTY (Right) as a surgical intervention .  The patient's history has been reviewed, patient examined, no change in status, stable for surgery.  I have reviewed the patient's chart and labs.  Questions were answered to the patient's satisfaction.     [name] C

## 2013-02-24 NOTE — Progress Notes (Signed)
Portable AP and Lateral Right Knee X-rays done. 

## 2013-02-24 NOTE — Anesthesia Preprocedure Evaluation (Addendum)
Anesthesia Evaluation  Patient identified by MRN, date of birth, ID band Patient awake    Reviewed: Allergy & Precautions, H&P , NPO status , Patient's Chart, lab work & pertinent test results  Airway Mallampati: II TM Distance: >3 FB Neck ROM: Full    Dental no notable dental hx.    Pulmonary shortness of breath,  breath sounds clear to auscultation  Pulmonary exam normal       Cardiovascular Exercise Tolerance: Good hypertension, Pt. on medications and Pt. on home beta blockers + dysrhythmias + Valvular Problems/Murmurs Rhythm:Regular Rate:Normal  S/p aortic valve replacement    Neuro/Psych PSYCHIATRIC DISORDERS Anxiety Depression negative neurological ROS     GI/Hepatic negative GI ROS, Neg liver ROS,   Endo/Other  Hyperthyroidism   Renal/GU Renal InsufficiencyRenal disease  negative genitourinary   Musculoskeletal negative musculoskeletal ROS (+)   Abdominal   Peds negative pediatric ROS (+)  Hematology negative hematology ROS (+)   Anesthesia Other Findings   Reproductive/Obstetrics negative OB ROS                         Anesthesia Physical Anesthesia Plan  ASA: III  Anesthesia Plan: General   Post-op Pain Management:    Induction: Intravenous  Airway Management Planned: Oral ETT  Additional Equipment:   Intra-op Plan:   Post-operative Plan: Extubation in OR  Informed Consent: I have reviewed the patients History and Physical, chart, labs and discussed the procedure including the risks, benefits and alternatives for the proposed anesthesia with the patient or authorized representative who has indicated his/her understanding and acceptance.   Dental advisory given  Plan Discussed with: CRNA  Anesthesia Plan Comments: (Lovenox, coumadin use. H/O renal insufficiency. Plan general. Patient agrees.)       Anesthesia Quick Evaluation

## 2013-02-24 NOTE — Progress Notes (Signed)
Clinical Social Work Department CLINICAL SOCIAL WORK PLACEMENT NOTE 02/24/2013  Patient:  [name], [name]  Account Number:  [id number] Admit date:  02/24/2013  Clinical Social Worker:  Cori Razor, LCSW  Date/time:  02/24/2013 04:38 PM  Clinical Social Work is seeking post-discharge placement for this patient at the following level of care:   SKILLED NURSING   (*CSW will update this form in Epic as items are completed)     Patient/family provided with [facility] Department of Clinical Social Work's list of facilities offering this level of care within the geographic area requested by the patient (or if unable, by the patient's family).  02/24/2013  Patient/family informed of their freedom to choose among providers that offer the needed level of care, that participate in Medicare, Medicaid or managed care program needed by the patient, have an available bed and are willing to accept the patient.    Patient/family informed of [facility]' ownership interest in [facility], as well as of the fact that they are under no obligation to receive care at this facility.  PASARR submitted to EDS on 02/24/2013 PASARR number received from EDS on 02/24/2013  FL2 transmitted to all facilities in geographic area requested by pt/family on  02/24/2013 FL2 transmitted to all facilities within larger geographic area on   Patient informed that his/her managed care company has contracts with or will negotiate with  certain facilities, including the following:     Patient/family informed of bed offers received:  02/24/2013 Patient chooses bed at [facility] Physician recommends and patient chooses bed at    Patient to be transferred to [facility] : [facility] on   Patient to be transferred to facility by   The following physician request were entered in Epic:   Additional Comments:  Cori Razor LCSW [phone]

## 2013-02-24 NOTE — Anesthesia Postprocedure Evaluation (Signed)
  Anesthesia Post-op Note  Patient: [name]  Procedure(s) Performed: Procedure(s) (LRB): RIGHT TOTAL KNEE ARTHROPLASTY (Right)  Patient Location: PACU  Anesthesia Type: General  Level of Consciousness: awake and alert   Airway and Oxygen Therapy: Patient Spontanous Breathing  Post-op Pain: mild  Post-op Assessment: Post-op Vital signs reviewed, Patient's Cardiovascular Status Stable, Respiratory Function Stable, Patent Airway and No signs of Nausea or vomiting  Last Vitals:  Filed Vitals:   02/24/13 1557  BP: 160/67  Pulse: 73  Temp: 37 C  Resp: 14    Post-op Vital Signs: stable   Complications: No apparent anesthesia complications

## 2013-02-24 NOTE — Progress Notes (Signed)
Utilization review completed.  

## 2013-02-24 NOTE — Brief Op Note (Signed)
02/24/2013  10:53 AM  PATIENT:  Lucky Rathke  62 y.o. female  PRE-OPERATIVE DIAGNOSIS:  RIGHT KNEE DEGENERATIVE JOINT DISEASE  POST-OPERATIVE DIAGNOSIS:  RIGHT KNEE DEGENERATIVE JOINT DISEASE  PROCEDURE:  Procedure(s): RIGHT TOTAL KNEE ARTHROPLASTY (Right)  SURGEON:  Surgeon(s) and Role:    * Javier Docker, MD - Primary  PHYSICIAN ASSISTANT:   ASSISTANTS: Skip Mayer   ANESTHESIA:   general  EBL:     BLOOD ADMINISTERED:none  DRAINS: (1) Hemovact drain(s) in the open with  Suction Open   LOCAL MEDICATIONS USED:  MARCAINE     SPECIMEN:  No Specimen  DISPOSITION OF SPECIMEN:  N/A  COUNTS:  YES  TOURNIQUET:    DICTATION: .Other Dictation: Dictation Number [id number]  PLAN OF CARE: Admit to inpatient   PATIENT DISPOSITION:  PACU - hemodynamically stable.   Delay start of Pharmacological VTE agent (>24hrs) due to surgical blood loss or risk of bleeding: no

## 2013-02-24 NOTE — Progress Notes (Signed)
Right Knee Xray results noted.

## 2013-02-24 NOTE — Transfer of Care (Signed)
Immediate Anesthesia Transfer of Care Note  Patient: [name]  Procedure(s) Performed: Procedure(s): RIGHT TOTAL KNEE ARTHROPLASTY (Right)  Patient Location: PACU  Anesthesia Type:General  Level of Consciousness: awake, alert , oriented and patient cooperative  Airway & Oxygen Therapy: Patient Spontanous Breathing and Patient connected to face mask oxygen  Post-op Assessment: Report given to PACU RN, Post -op Vital signs reviewed and stable and Patient moving all extremities X 4  Post vital signs: stable  Complications: No apparent anesthesia complications

## 2013-02-24 NOTE — Progress Notes (Signed)
Talked with Dr. Ermelinda Das Office- office talked with Dr. Shelle Iron- he will put in CPM orders later today.

## 2013-02-24 NOTE — Progress Notes (Signed)
Pre op in short stay. Pt arrived today with abrasion on right shin 3 cm x 1 cm with some scabbing and pink but no drainage. Pt states it happened since she saw Dr Shelle Iron at the office .

## 2013-02-24 NOTE — Progress Notes (Signed)
Clinical Social Work Department BRIEF PSYCHOSOCIAL ASSESSMENT 02/24/2013  Patient:  [name], [name]     Account Number:  [id number]     Admit date:  02/24/2013  Clinical Social Worker:  Candie Chroman  Date/Time:  02/24/2013 04:33 PM  Referred by:  Physician  Date Referred:  02/24/2013 Referred for  SNF Placement   Other Referral:   Interview type:  Patient Other interview type:    PSYCHOSOCIAL DATA Living Status:  HUSBAND Admitted from facility:   Level of care:   Primary support name:  Riki Rusk Primary support relationship to patient:  SPOUSE Degree of support available:   unclear    CURRENT CONCERNS Current Concerns  Post-Acute Placement   Other Concerns:    SOCIAL WORK ASSESSMENT / PLAN Pt is a 62 yr old female living at home prior to hospitalization. CSW met with pt to assist with d/c planning. Pt has made prior arrangements to have ST Rehab at [facility] following hospital d/c. CSW has contacted SNF and d/c plan has been confirmed pending BCBS authorization. CSW will continue to follow pt to assist with d/c planning to SNF.   Assessment/plan status:  Psychosocial Support/Ongoing Assessment of Needs Other assessment/ plan:   Information/referral to community resources:   None needed at this time.    PATIENT'S/FAMILY'S RESPONSE TO PLAN OF CARE: Pt is looking forward to having rehab at [facility].    Cori Razor LCSW [phone]

## 2013-02-24 NOTE — Progress Notes (Addendum)
ANTICOAGULATION CONSULT NOTE - Initial Consult  Pharmacy Consult for warfarin Indication: VTE prophylaxis and h/o St. Jude AVR  Allergies  Allergen Reactions  . Sulfonamide Derivatives     headaches    Patient Measurements:    Vital Signs: Temp: 98.6 F (37 C) (10/02 1258) Temp src: Oral (10/02 0811) BP: 174/71 mmHg (10/02 1330) Pulse Rate: 73 (10/02 1330)  Labs:  Recent Labs  02/24/13 0900  LABPROT 14.1  INR 1.11    The CrCl is unknown because both a height and weight (above a minimum accepted value) are required for this calculation.   Medical History: Past Medical History  Diagnosis Date  . Anemia   . Anxiety   . Depression   . Diverticulosis   . Elevated cholesterol   . Obesity   . THYROMEGALY 07/22/2010  . Overweight(278.02) 03/11/2010  . Mixed hyperlipidemia 03/11/2010  . MEASLES, HX OF 03/11/2010  . KNEE PAIN, RIGHT 05/28/2010  . HYPERTRIGLYCERIDEMIA 03/11/2010  . HEART MURMUR, HX OF 03/11/2010  . FIBROIDS, UTERUS 03/11/2010  . Diarrhea 08/05/2010  . CONSTIPATION 03/11/2010  . CHICKENPOX, HX OF 03/11/2010  . BIPOLAR AFFECTIVE DISORDER 03/11/2010  . AORTIC VALVE REPLACEMENT, HX OF 03/11/2010  . ANEMIA 05/28/2010  . Acute bronchitis 07/22/2010  . Abdominal pain, generalized 03/19/2010  . Peripheral edema 11/18/2010  . Bacterial vaginitis 11/18/2010  . Hematuria 12/18/2010  . Diverticulitis 12/18/2010  . Hyperthyroidism 12/18/2010  . Fatigue 12/30/2010  . Renal insufficiency 12/30/2010  . Grave's disease 8-12  . Arm lesion 03/23/2011  . UTI (lower urinary tract infection) 08/14/2011  . Breast pain in female 11/17/2011  . Chest pain 11/17/2011  . Hematoma 12/18/2011  . Cervical cancer screening 03/22/2012  . Foot pain, left 03/22/2012  . Foot fracture, left 03/22/2012  . Tick bite of back 10/15/2012  . Preop examination 02/03/2013  . Dysrhythmia   . Hypertension   . Shortness of breath     with exertion   . Heart murmur   . Urinary tract bacterial  infections   . Arthritis     Assessment: 41 YOF s/p R TKA.  Prior to admission she was on LMWH bridge while warfarin on hold for surgery.  She has h/o St. Jude AVR in 2009.  Coumadin dosing prior to holding for OR, coumadin 5mg  on MF and 2.5mg  all other days  Baseline INR = 1.11  Baseline Hgb = 11.6  Started on lovenox 30mg  SQ q12h  Goal of Therapy:  INR 2-3 Monitor platelets by anticoagulation protocol: Yes   Plan:   Coumadin 5mg  PO tonight  Continue Lovenox 30mg  SQ q12h, cab consider change to 100mg  SQ q12h 48-72h post-op to minimize bleeding risk   Daily INR  Juliette Alcide, PharmD, BCPS.   Pager: [phone]  02/24/2013,1:50 PM

## 2013-02-24 NOTE — Preoperative (Signed)
Beta Blockers   Reason not to administer Beta Blockers:Not Applicable, took BB this am 

## 2013-02-24 NOTE — H&P (View-Only) (Signed)
[name]. [name] DOB: 1951-03-19 Single / Language: English / Race: White Female  H&P date 02/22/13  Chief complaint: R knee pain  History of Present Illness The patient is a 62 year old female who comes in today for a preoperative History and Physical. The patient is scheduled for a right total knee arthroplasty to be performed by Dr. Javier Docker, MD at [facility] on 02/24/13. Pt with R knee end-stage DJD, interfering with ADLs, refractory to conservative tx including tylenol, activity modifications, quad strengthening, ice, elevation, steroid injections, viscosupplementation, bracing, opioid and non-opioid pain medications. We have obtained pre-op clearance from cardiologist Dr. Verdis Prime and PCP Dr. Darrow Bussing, she is currently holding coumadin, bridging with lovenox and has been instructed to continue that through wednesday, the day before surgery. Dr. Shelle Iron and the pt mutually agreed to proceed with a total knee replacement. Risks and benefits of the procedure were discussed including stiffness, suboptimal range of motion, persistent pain, infection requiring removal of prosthesis and reinsertion, need for prophylactic antibiotics in the future, for example, dental procedures, possible need for manipulation, revision in the future and also anesthetic complications including DVT, PE, etc. We discussed the perioperative course, time in the hospital, postoperative recovery and the need for elevation to control swelling. We also discussed the predicted range of motion and the probability that squatting and kneeling would be unobtainable in the future. In addition, postoperative anticoagulation was discussed. Provided her illustrated handout and discussed it in detail. They will enroll in the total joint replacement educational forum at the hospital. She reports a recent UTI for which she finished a 3 day course of abx, no ongoing symptoms. She had her pre-op appt at [facility]. She was  still on her coumadin at the time so INR elevated.  Past Medical History Bipolar Disorder Skin Cancer. forehead Anemia Anxiety Disorder Depression Hyperthyroidism Irritable bowel syndrome Psychiatric disorder Chronic fatigue syndrome  Allergies Sulfa Drugs. headache  Family History Rheumatoid Arthritis. mother Severe allergy. mother Chronic Obstructive Lung Disease. mother Heart disease in female family member before age 42 Osteoarthritis. mother Heart Disease. mother Heart disease in female family member before age 42 Suicide. brother  Social History Tobacco use. Former smoker. quit 22 yrs ago Alcohol use. current drinker; drinks wine; only occasionally per week Children. 2 Current work status. retired Illicit drug use. no Drug/Alcohol Rehab (Currently). no Exercise. Exercises weekly; does running / walking and gym / weights Living situation. live with spouse who is 20 yrs older in apt with 20+ steps into apt Marital status. married Drug/Alcohol Rehab (Previously). no Tobacco / smoke exposure. no Post-Surgical Plans. rehab center- planning [facility] Advance Directives. healthcare POA  Medication History Tylenol (325MG  Tablet, Oral) Active. Tylenol with Codeine #3 (300-30MG  Tablet, Oral) Active. Aspirin (81MG  Tablet, Oral) Active. Vitamin B Complex ( Oral) Active. ClonazePAM ODT (0.5MG  Tablet Disperse, Oral) Active. Vitamin B 12 ( Oral) Specific dose unknown - Active. Docusate Calcium ( Oral) Specific dose unknown - Active. Ferrous Fumarate (324MG  Tablet, Oral) Active. Furosemide (20MG  Tablet, Oral) Active. LamoTRIgine (150MG  Tablet, Oral) Active. Metoprolol Succinate ER ( Oral) Specific dose unknown - Active. Omega 3 ( Oral) Specific dose unknown - Active. Pantoprazole Sodium (40MG  Packet, Oral) Active. Pravastatin Sodium (40MG  Tablet, Oral) Active. Probiotic ( Oral) Active. Propylene Glycol (Otic) ( Otic) Specific dose unknown -  Active. QUEtiapine Fumarate (200MG  Tablet, Oral) Active. Venlafaxine HCl (150MG  Capsule ER, Oral) Active. Coumadin (5MG  Tablet, Oral) Active. Nitroglycerin (0.4MG  Tab Sublingual, Sublingual) Active. Multiple Vitamin (1 (  one) Oral) Active. Vitamin C (1 (one) Oral) Specific dose unknown - Active.  Past Surgical History Foot Surgery. left Gallbladder Surgery. open Hysterectomy. complete (non-cancerous) Ankle Surgery. left Valve Replacement. aortic aneurysm Inguinal Hernia Repair. laparoscopic: bilateral Appendectomy. Open.  Review of Systems General:Present- Fatigue. Not Present- Chills, Fever, Night Sweats, Weight Gain, Weight Loss and Memory Loss. Skin:Not Present- Hives, Itching, Rash, Eczema and Lesions. HEENT:Not Present- Tinnitus, Headache, Double Vision, Visual Loss, Hearing Loss and Dentures. Respiratory:Present- Shortness of breath with exertion. Not Present- Shortness of breath at rest, Allergies, Coughing up blood and Chronic Cough. Cardiovascular:Not Present- Chest Pain, Racing/skipping heartbeats, Difficulty Breathing Lying Down, Murmur, Swelling and Palpitations. Gastrointestinal:Not Present- Bloody Stool, Heartburn, Abdominal Pain, Vomiting, Nausea, Constipation, Diarrhea, Difficulty Swallowing, Jaundice and Loss of appetitie. Female Genitourinary:Not Present- Blood in Urine, Urinary frequency, Weak urinary stream, Discharge, Flank Pain, Incontinence, Painful Urination, Urgency, Urinary Retention and Urinating at Night. Musculoskeletal:Present- Joint Stiffness, Joint Swelling, Joint Pain and Morning Stiffness. Not Present- Muscle Weakness, Muscle Pain, Back Pain and Spasms. Neurological:Not Present- Tremor, Dizziness, Blackout spells, Paralysis, Difficulty with balance and Weakness. Psychiatric:Present- Insomnia.  Vitals 02/22/2013 9:15 AM Weight: 222 lb Height: 68 in Weight was reported by patient. Height was reported by patient. Body Surface  Area: 2.2 m Body Mass Index: 33.75 kg/m BP: 157/82 (Sitting, Left Arm, Standard)  Physical Exam The physical exam findings are as follows:  General Mental Status - Alert, cooperative and good historian. General Appearance- pleasant and Anxious. Not in acute distress. Orientation- Oriented X3. Build & Nutrition- Well nourished and Well developed. Gait- Use of assistive device and Antalgic.  Head and Neck Head- normocephalic, atraumatic . Neck Global Assessment- supple. no bruit auscultated on the right and no bruit auscultated on the left.  Eye Pupil- Bilateral- Regular and Round. Motion- Bilateral- EOMI.  Chest and Lung Exam Auscultation: Breath sounds:- clear at anterior chest wall and - clear at posterior chest wall. Adventitious sounds:- No Adventitious sounds.  Cardiovascular Auscultation:Rhythm- Regular rate and rhythm. Heart Sounds- S1 WNL and S2 WNL. Murmurs & Other Heart Sounds:Auscultation of the heart reveals - No Murmurs.  Abdomen Palpation/Percussion:Tenderness- Abdomen is non-tender to palpation. Rigidity (guarding)- Abdomen is soft. Auscultation:Auscultation of the abdomen reveals - Bowel sounds normal.  Female Genitourinary Not done, not pertinent to present illness  Musculoskeletal Right Knee: Inspection and Palpation: Tenderness - medial joint line tender to palpation and lateral joint line tender to palpation. no tenderness to palpation of the superior calf, no tenderness to palpation of the pes anserine bursa, no tenderness to palpation of the quadriceps tendon, no tenderness to palpation of the patellar tendon, no tenderness to palpation of the patella, no tenderness to palpation of the fibular head and no tenderness to palpation of the peroneal nerve. Effusion - trace. Tissue tension/texture is - soft. Crepitus - moderate. Pulses - 2+. Sensation - intact to light touch. Skin - Color - no ecchymosis and no  erythema. Strength and Tone: Quadriceps - 5/5. Hamstrings - 5/5. ROM: Flexion: AROM - 100 . Extension: AROM - 0 . Instability - Valgus Laxity at 30 - None. Valgus Laxity at 0 - None. Varus Laxity at 30 - None. Varus Laxity at 0 - None. Lachman - Negative. Anterior Drawer Test - Negative. Posterior Drawer Test - Negative. Deformities/Malalignments/Discrepancies - no deformities noted. Special Tests: McMurray Test (lateral) - negative. McMurray Test (medial) - negative. Patellar Compression Pain - mild pain.  Imaging standing xrays with tricompartmental DJD right knee, worst in the medial compartment with end-stage bone-on-bone  arthrosis. Slight varus alignment.  Assessment & Plan DJD right knee  Pt with end-stage R knee DJD, refractory to conservative tx as listed above. She is scheduled for right TKA by Dr Shelle Iron on 10/2. We again discussed surgery itself as well as risks, complications, alternatives including but not limited to DVT, PE, infx, bleeding, failure of procedure, need for secondary procedure, anesthesia risk, even death. Discussed possible need for manipulation. Discussed expected outcomes, post-op protocols, need for PT, DVT and dental ppx, activity modifications and restrictions. Demonstrated flexion/extension exercises to do with PT and on her own. She desires to proceed. She is bridging her coumadin and will stop lovenox the day before surgery. She will hold any multivitamins and ASA. She will remain NPO after MN night before surgery. Will redraw PT/INR AM of surgery. Plan for rehab stay post-op given many steps at home. All her questions were answered. She will follow up 10-14 days post-op for suture removal and xrays.  Plan right total knee replacement   Signed electronically by Dorothy Spark, PA-C for Dr. Shelle Iron

## 2013-02-25 DIAGNOSIS — M1711 Unilateral primary osteoarthritis, right knee: Secondary | ICD-10-CM

## 2013-02-25 LAB — BASIC METABOLIC PANEL
BUN: 12 mg/dL (ref 6–23)
CO2: 27 mEq/L (ref 19–32)
Calcium: 8.9 mg/dL (ref 8.4–10.5)
Chloride: 102 mEq/L (ref 96–112)
Creatinine, Ser: 0.88 mg/dL (ref 0.50–1.10)
GFR calc Af Amer: 80 mL/min — ABNORMAL LOW (ref >90)
GFR calc non Af Amer: 69 mL/min — ABNORMAL LOW (ref >90)
Glucose, Bld: 162 mg/dL — ABNORMAL HIGH (ref 70–99)
Potassium: 4.2 mEq/L (ref 3.5–5.1)
Sodium: 138 mEq/L (ref 135–145)

## 2013-02-25 LAB — CBC
HCT: 30.2 % — ABNORMAL LOW (ref 36.0–46.0)
Hemoglobin: 9.9 g/dL — ABNORMAL LOW (ref 12.0–15.0)
MCH: 30.2 pg (ref 26.0–34.0)
MCHC: 32.8 g/dL (ref 30.0–36.0)
MCV: 92.1 fL (ref 78.0–100.0)
Platelets: 250 10*3/uL (ref 150–400)
RBC: 3.28 MIL/uL — ABNORMAL LOW (ref 3.87–5.11)
RDW: 14.2 % (ref 11.5–15.5)
WBC: 14 10*3/uL — ABNORMAL HIGH (ref 4.0–10.5)

## 2013-02-25 LAB — PROTIME-INR
INR: 1.17 (ref 0.00–1.49)
Prothrombin Time: 14.7 seconds (ref 11.6–15.2)

## 2013-02-25 MED ORDER — WARFARIN SODIUM 5 MG PO TABS
5.0000 mg | ORAL_TABLET | Freq: Once | ORAL | Status: AC
  Administered 2013-02-25: 5 mg via ORAL
  Filled 2013-02-25: qty 1

## 2013-02-25 MED ORDER — KCL IN DEXTROSE-NACL 20-5-0.9 MEQ/L-%-% IV SOLN
INTRAVENOUS | Status: AC
  Administered 2013-02-25: 20:00:00 via INTRAVENOUS
  Filled 2013-02-25 (×4): qty 1000

## 2013-02-25 NOTE — Progress Notes (Signed)
CSW assisting with d/c planning. [facility] will begin authorization process for [employer] for SNF placement. BCBS will need to review PT notes from post op day 2. Since this falls on a SAT. and BCBS is closed, Monday is earliest a decision will be made by [facility] . CSW will continue to follow to assist with d/c planning to SNF.  Cori Razor LCSW [phone]

## 2013-02-25 NOTE — Evaluation (Signed)
Physical Therapy Evaluation Patient Details Name: [name] MRN: [id number] DOB: 04-03-1951 Today's Date: 02/25/2013 Time: 4098-1191 PT Time Calculation (min): 19 min  PT Assessment / Plan / Recommendation History of Present Illness  s/p R TKA 10/2. Hx of bipolar d/c, chronic fatigue syndrome, anxiety  Clinical Impression  On eval, pt required +2 assist for mobility-able to take a few steps in room. Pt very drowsy. Mobility limited by pain. Demonstrates decreased strength, decreased activity tolerance, impaired gait and balance. Recommend SNF for ST rehab.     PT Assessment  Patient needs continued PT services    Follow Up Recommendations  SNF    Does the patient have the potential to tolerate intense rehabilitation      Barriers to Discharge        Equipment Recommendations  None recommended by PT    Recommendations for Other Services OT consult   Frequency 7X/week    Precautions / Restrictions Precautions Precautions: Fall;Knee Required Braces or Orthoses: Knee Immobilizer - Right Knee Immobilizer - Right: Discontinue once straight leg raise with < 10 degree lag Restrictions Weight Bearing Restrictions: No RLE Weight Bearing: Weight bearing as tolerated   Pertinent Vitals/Pain 9/10 R knee. Ice applied end of session.       Mobility  Bed Mobility Bed Mobility: Supine to Sit Supine to Sit: 1: +2 Total assist;HOB elevated;With rails Supine to Sit: Patient Percentage: 70% Details for Bed Mobility Assistance: Assist for trunk and R LE. Increased time. Multimodal cues for safety, technique, hand placement. Utilized bed pad for scooting, positioning.  Transfers Transfers: Sit to Stand;Stand to Sit Sit to Stand: 1: +2 Total assist;From bed;From elevated surface Sit to Stand: Patient Percentage: 50% Stand to Sit: 1: +2 Total assist;To chair/3-in-1 Stand to Sit: Patient Percentage: 50% Details for Transfer Assistance: Multimodal cues for safety, technique, hand  placement. Assist to rise, stabilize, control descent.  Ambulation/Gait Ambulation/Gait Assistance: 1: +2 Total assist Ambulation/Gait: Patient Percentage: 50% Ambulation Distance (Feet): 5 Feet Assistive device: Rolling walker Ambulation/Gait Assistance Details: Multimodal cues for safety, technique, sequence, posture. Facilitation at sternum, sacrum to encourage trunk extension. Fatigues easily. Limited by pain. Followed with recliner.  Gait Pattern: Step-to pattern;Decreased stride length;Decreased step length - left;Antalgic;Trunk flexed    Exercises     PT Diagnosis: Difficulty walking;Abnormality of gait;Generalized weakness;Acute pain  PT Problem List: Decreased strength;Decreased range of motion;Decreased activity tolerance;Decreased balance;Decreased mobility;Pain;Decreased knowledge of precautions;Decreased knowledge of use of DME PT Treatment Interventions: DME instruction;Gait training;Functional mobility training;Therapeutic activities;Therapeutic exercise;Patient/family education;Balance training     PT Goals(Current goals can be found in the care plan section) Acute Rehab PT Goals Patient Stated Goal: none stated PT Goal Formulation: With patient/family Time For Goal Achievement: 03/11/13 Potential to Achieve Goals: Good  Visit Information  Last PT Received On: 02/25/13 Assistance Needed: +2 History of Present Illness: s/p R TKA 10/2. Hx of bipolar d/c, chronic fatigue syndrome, anxiety       Prior Functioning  Home Living Family/patient expects to be discharged to:: Skilled nursing facility Communication Communication: No difficulties    Cognition  Cognition Arousal/Alertness: Lethargic Behavior During Therapy: Flat affect (drowsy) Overall Cognitive Status: Difficult to assess Difficult to assess due to: Level of arousal    Extremity/Trunk Assessment Upper Extremity Assessment Upper Extremity Assessment: Defer to OT evaluation Lower Extremity  Assessment Lower Extremity Assessment: RLE deficits/detail RLE Deficits / Details: moves ankle. hip flex 2/5, hip abd/add 2/5 RLE: Unable to fully assess due to pain Cervical / Trunk Assessment Cervical /  Trunk Assessment: Normal   Balance    End of Session PT - End of Session Equipment Utilized During Treatment: Gait belt Activity Tolerance: Patient limited by fatigue;Patient limited by pain;Patient limited by lethargy Patient left: in chair;with call bell/phone within reach;with family/visitor present CPM Right Knee CPM Right Knee: Off  GP     Rebeca Alert, MPT Pager: [phone]

## 2013-02-25 NOTE — Discharge Summary (Deleted)
Physician Discharge Summary   Patient ID: [name] MRN: [id number] DOB/AGE: [birth year] 62 y.o.  Admit date: 02/24/2013 Discharge date: anticipated 10/5 or 10/6  Primary Diagnosis: right knee DJD  Admission Diagnoses:  Past Medical History  Diagnosis Date  . Anemia   . Anxiety   . Depression   . Diverticulosis   . Elevated cholesterol   . Obesity   . THYROMEGALY 07/22/2010  . Overweight(278.02) 03/11/2010  . Mixed hyperlipidemia 03/11/2010  . MEASLES, HX OF 03/11/2010  . KNEE PAIN, RIGHT 05/28/2010  . HYPERTRIGLYCERIDEMIA 03/11/2010  . HEART MURMUR, HX OF 03/11/2010  . FIBROIDS, UTERUS 03/11/2010  . Diarrhea 08/05/2010  . CONSTIPATION 03/11/2010  . CHICKENPOX, HX OF 03/11/2010  . BIPOLAR AFFECTIVE DISORDER 03/11/2010  . AORTIC VALVE REPLACEMENT, HX OF 03/11/2010  . ANEMIA 05/28/2010  . Acute bronchitis 07/22/2010  . Abdominal pain, generalized 03/19/2010  . Peripheral edema 11/18/2010  . Bacterial vaginitis 11/18/2010  . Hematuria 12/18/2010  . Diverticulitis 12/18/2010  . Hyperthyroidism 12/18/2010  . Fatigue 12/30/2010  . Renal insufficiency 12/30/2010  . Grave's disease 8-12  . Arm lesion 03/23/2011  . UTI (lower urinary tract infection) 08/14/2011  . Breast pain in female 11/17/2011  . Chest pain 11/17/2011  . Hematoma 12/18/2011  . Cervical cancer screening 03/22/2012  . Foot pain, left 03/22/2012  . Foot fracture, left 03/22/2012  . Tick bite of back 10/15/2012  . Preop examination 02/03/2013  . Dysrhythmia   . Hypertension   . Shortness of breath     with exertion   . Heart murmur   . Urinary tract bacterial infections   . Arthritis    Discharge Diagnoses:   Principal Problem:   Right knee DJD  Estimated body mass index is 33.86 kg/(m^2) as calculated from the following:   Height as of this encounter: 5\' 8"  (1.727 m).   Weight as of this encounter: 101 kg (222 lb 10.6 oz).  Procedure:  Procedure(s) (LRB): RIGHT TOTAL KNEE ARTHROPLASTY (Right)   Consults:  None  HPI: see H&P Laboratory Data: Admission on 02/24/2013  Component Date Value Range Status  . Prothrombin Time 02/24/2013 14.1  11.6 - 15.2 seconds Final  . INR 02/24/2013 1.11  0.00 - 1.49 Final  . WBC 02/24/2013 13.1* 4.0 - 10.5 K/uL Final  . RBC 02/24/2013 3.42* 3.87 - 5.11 MIL/uL Final  . Hemoglobin 02/24/2013 10.4* 12.0 - 15.0 g/dL Final  . HCT 40/98/1191 31.1* 36.0 - 46.0 % Final  . MCV 02/24/2013 90.9  78.0 - 100.0 fL Final  . MCH 02/24/2013 30.4  26.0 - 34.0 pg Final  . MCHC 02/24/2013 33.4  30.0 - 36.0 g/dL Final  . RDW 47/82/9562 14.1  11.5 - 15.5 % Final  . Platelets 02/24/2013 238  150 - 400 K/uL Final  . Creatinine, Ser 02/24/2013 1.03  0.50 - 1.10 mg/dL Final  . GFR calc non Af Amer 02/24/2013 57* >90 mL/min Final  . GFR calc Af Amer 02/24/2013 66* >90 mL/min Final   Comment: (NOTE)                          The eGFR has been calculated using the CKD EPI equation.                          This calculation has not been validated in all clinical situations.  eGFR's persistently <90 mL/min signify possible Chronic Kidney                          Disease.  Marland Kitchen Prothrombin Time 02/25/2013 14.7  11.6 - 15.2 seconds Final  . INR 02/25/2013 1.17  0.00 - 1.49 Final  . WBC 02/25/2013 14.0* 4.0 - 10.5 K/uL Final  . RBC 02/25/2013 3.28* 3.87 - 5.11 MIL/uL Final  . Hemoglobin 02/25/2013 9.9* 12.0 - 15.0 g/dL Final  . HCT 16/02/9603 30.2* 36.0 - 46.0 % Final  . MCV 02/25/2013 92.1  78.0 - 100.0 fL Final  . MCH 02/25/2013 30.2  26.0 - 34.0 pg Final  . MCHC 02/25/2013 32.8  30.0 - 36.0 g/dL Final  . RDW 54/01/8118 14.2  11.5 - 15.5 % Final  . Platelets 02/25/2013 250  150 - 400 K/uL Final  . Sodium 02/25/2013 138  135 - 145 mEq/L Final  . Potassium 02/25/2013 4.2  3.5 - 5.1 mEq/L Final  . Chloride 02/25/2013 102  96 - 112 mEq/L Final  . CO2 02/25/2013 27  19 - 32 mEq/L Final  . Glucose, Bld 02/25/2013 162* 70 - 99 mg/dL Final  . BUN 14/78/2956 12  6 -  23 mg/dL Final  . Creatinine, Ser 02/25/2013 0.88  0.50 - 1.10 mg/dL Final  . Calcium 21/30/8657 8.9  8.4 - 10.5 mg/dL Final  . GFR calc non Af Amer 02/25/2013 69* >90 mL/min Final  . GFR calc Af Amer 02/25/2013 80* >90 mL/min Final   Comment: (NOTE)                          The eGFR has been calculated using the CKD EPI equation.                          This calculation has not been validated in all clinical situations.                          eGFR's persistently <90 mL/min signify possible Chronic Kidney                          Disease.  Hospital Outpatient Visit on 02/17/2013  Component Date Value Range Status  . Sodium 02/17/2013 139  135 - 145 mEq/L Final  . Potassium 02/17/2013 4.2  3.5 - 5.1 mEq/L Final  . Chloride 02/17/2013 100  96 - 112 mEq/L Final  . CO2 02/17/2013 25  19 - 32 mEq/L Final  . Glucose, Bld 02/17/2013 93  70 - 99 mg/dL Final  . BUN 84/69/6295 21  6 - 23 mg/dL Final  . Creatinine, Ser 02/17/2013 1.19* 0.50 - 1.10 mg/dL Final  . Calcium 28/41/3244 9.6  8.4 - 10.5 mg/dL Final  . GFR calc non Af Amer 02/17/2013 48* >90 mL/min Final  . GFR calc Af Amer 02/17/2013 56* >90 mL/min Final   Comment: (NOTE)                          The eGFR has been calculated using the CKD EPI equation.                          This calculation has not been validated in all clinical situations.  eGFR's persistently <90 mL/min signify possible Chronic Kidney                          Disease.  . WBC 02/17/2013 7.9  4.0 - 10.5 K/uL Final  . RBC 02/17/2013 3.85* 3.87 - 5.11 MIL/uL Final  . Hemoglobin 02/17/2013 11.6* 12.0 - 15.0 g/dL Final  . HCT 16/02/9603 34.8* 36.0 - 46.0 % Final  . MCV 02/17/2013 90.4  78.0 - 100.0 fL Final  . MCH 02/17/2013 30.1  26.0 - 34.0 pg Final  . MCHC 02/17/2013 33.3  30.0 - 36.0 g/dL Final  . RDW 54/01/8118 14.0  11.5 - 15.5 % Final  . Platelets 02/17/2013 273  150 - 400 K/uL Final  . Prothrombin Time 02/17/2013 25.1* 11.6 - 15.2  seconds Final  . INR 02/17/2013 2.37* 0.00 - 1.49 Final  . ABO/RH(D) 02/17/2013 A POS   Final  . Antibody Screen 02/17/2013 NEG   Final  . Sample Expiration 02/17/2013 02/27/2013   Final  . Color, Urine 02/17/2013 YELLOW  YELLOW Final  . APPearance 02/17/2013 CLEAR  CLEAR Final  . Specific Gravity, Urine 02/17/2013 1.011  1.005 - 1.030 Final  . pH 02/17/2013 6.5  5.0 - 8.0 Final  . Glucose, UA 02/17/2013 NEGATIVE  NEGATIVE mg/dL Final  . Hgb urine dipstick 02/17/2013 NEGATIVE  NEGATIVE Final  . Bilirubin Urine 02/17/2013 NEGATIVE  NEGATIVE Final  . Ketones, ur 02/17/2013 NEGATIVE  NEGATIVE mg/dL Final  . Protein, ur 14/78/2956 NEGATIVE  NEGATIVE mg/dL Final  . Urobilinogen, UA 02/17/2013 0.2  0.0 - 1.0 mg/dL Final  . Nitrite 21/30/8657 NEGATIVE  NEGATIVE Final  . Leukocytes, UA 02/17/2013 NEGATIVE  NEGATIVE Final   MICROSCOPIC NOT DONE ON URINES WITH NEGATIVE PROTEIN, BLOOD, LEUKOCYTES, NITRITE, OR GLUCOSE <1000 mg/dL.  Marland Kitchen MRSA, PCR 02/17/2013 NEGATIVE  NEGATIVE Final  . Staphylococcus aureus 02/17/2013 NEGATIVE  NEGATIVE Final   Comment:                                 The Xpert SA Assay (FDA                          approved for NASAL specimens                          in patients over 68 years of age),                          is one component of                          a comprehensive surveillance                          program.  Test performance has                          been validated by [employer] for patients greater  than or equal to 50 year old.                          It is not intended                          to diagnose infection nor to                          guide or monitor treatment.  Marland Kitchen aPTT 02/17/2013 48* 24 - 37 seconds Final   Comment:                                 IF BASELINE aPTT IS ELEVATED,                          SUGGEST PATIENT RISK ASSESSMENT                          BE USED TO DETERMINE  APPROPRIATE                          ANTICOAGULANT THERAPY.  . ABO/RH(D) 02/17/2013 A POS   Final  Office Visit on 02/10/2013  Component Date Value Range Status  . Culture 02/10/2013 STAPHYLOCOCCUS SPECIES (COAGULASE NEGATIVE)   Final  . Colony Count 02/10/2013 30,000 COLONIES/ML   Final  . Organism ID, Bacteria 02/10/2013 STAPHYLOCOCCUS SPECIES (COAGULASE NEGATIVE)   Final   Comment: Rifampin and Gentamicin should not be used as                          single drugs for treatment of Staph infections.  . Color, UA 02/10/2013 gold, dark   Final   Odorous  . Clarity, UA 02/10/2013 murky   Final  . Glucose, UA 02/10/2013 neg   Final  . Bilirubin, UA 02/10/2013 small   Final  . Ketones, UA 02/10/2013 neg   Final  . Spec Grav, UA 02/10/2013 >=1.030   Final  . Blood, UA 02/10/2013 large +++   Final  . pH, UA 02/10/2013 6.5   Final  . Protein, UA 02/10/2013 100 ++   Final  . Urobilinogen, UA 02/10/2013 0.2   Final  . Nitrite, UA 02/10/2013 neg   Final  . Leukocytes, UA 02/10/2013 moderate (2+)   Final  Office Visit on 02/03/2013  Component Date Value Range Status  . WBC 02/03/2013 5.5  4.0 - 10.5 K/uL Final  . RBC 02/03/2013 3.95  3.87 - 5.11 MIL/uL Final  . Hemoglobin 02/03/2013 11.8* 12.0 - 15.0 g/dL Final  . HCT 16/02/9603 35.2* 36.0 - 46.0 % Final  . MCV 02/03/2013 89.1  78.0 - 100.0 fL Final  . MCH 02/03/2013 29.9  26.0 - 34.0 pg Final  . MCHC 02/03/2013 33.5  30.0 - 36.0 g/dL Final  . RDW 54/01/8118 14.2  11.5 - 15.5 % Final  . Platelets 02/03/2013 281  150 - 400 K/uL Final  . Sodium 02/03/2013 138  135 - 145 mEq/L Final  . Potassium 02/03/2013 4.7  3.5 - 5.3 mEq/L Final  . Chloride 02/03/2013 103  96 - 112 mEq/L Final  . CO2 02/03/2013 28  19 - 32 mEq/L Final  . Glucose,  Bld 02/03/2013 66* 70 - 99 mg/dL Final  . BUN 11/91/4782 21  6 - 23 mg/dL Final  . Creat 95/62/1308 1.43* 0.50 - 1.10 mg/dL Final  . Albumin 65/78/4696 4.6  3.5 - 5.2 g/dL Final  . Calcium 29/52/8413 9.8   8.4 - 10.5 mg/dL Final  . Phosphorus 24/40/1027 3.4  2.3 - 4.6 mg/dL Final  . Total Bilirubin 02/03/2013 0.5  0.3 - 1.2 mg/dL Final  . Bilirubin, Direct 02/03/2013 0.1  0.0 - 0.3 mg/dL Final  . Indirect Bilirubin 02/03/2013 0.4  0.0 - 0.9 mg/dL Final  . Alkaline Phosphatase 02/03/2013 34* 39 - 117 U/L Final  . AST 02/03/2013 26  0 - 37 U/L Final  . ALT 02/03/2013 21  0 - 35 U/L Final  . Total Protein 02/03/2013 7.1  6.0 - 8.3 g/dL Final  . Albumin 25/36/6440 4.6  3.5 - 5.2 g/dL Final  . TSH 34/74/2595 2.778  0.350 - 4.500 uIU/mL Final     X-Rays:Dg Knee 1-2 Views Right  02/24/2013   CLINICAL DATA:  Postop  EXAM: RIGHT KNEE - 1-2 VIEW  COMPARISON:  02/17/2013  FINDINGS: Right total knee arthroplasty has been performed. Satisfactory position and alignment of the femoral and tibial components. Surgical drain good position.  IMPRESSION: Satisfactory right TKA.   Electronically Signed   By: Davonna Belling M.D.   On: 02/24/2013 13:44   Dg Knee 1-2 Views Right  02/17/2013   CLINICAL DATA:  62 year old female preoperative study for knee arthroplasty.  EXAM: RIGHT KNEE - 1-2 VIEW  COMPARISON:  05/29/2010.  FINDINGS: Chronic medial compartment joint space loss and degenerative spurring. Moderate degenerative spurring at the lateral femoral condyles appears increased. Patella intact. No definite joint effusion. No fracture or dislocation.  IMPRESSION: Chronic medial compartment dominant degenerative changes at the right knee.   Electronically Signed   By: Augusto Gamble M.D.   On: 02/17/2013 14:47    EKG: Orders placed in visit on 04/23/12  . EKG     Hospital Course: CHAELYN BUNYAN is a 62 y.o. who was admitted to [facility]. They were brought to the operating room on 02/24/2013 and underwent Procedure(s): RIGHT TOTAL KNEE ARTHROPLASTY.  Patient tolerated the procedure well and was later transferred to the recovery room and then to the orthopaedic floor for postoperative care.  They were given PO and  IV analgesics for pain control following their surgery.  They were given 24 hours of postoperative antibiotics of  Anti-infectives   Start     Dose/Rate Route Frequency Ordered Stop   02/24/13 1700  ceFAZolin (ANCEF) IVPB 2 g/50 mL premix     2 g 100 mL/hr over 30 Minutes Intravenous Every 6 hours 02/24/13 1507 02/25/13 0449   02/24/13 1121  polymyxin B 500,000 Units, bacitracin 50,000 Units in sodium chloride irrigation 0.9 % 500 mL irrigation  Status:  Discontinued       As needed 02/24/13 1121 02/24/13 1257   02/24/13 0845  ceFAZolin (ANCEF) IVPB 2 g/50 mL premix     2 g 100 mL/hr over 30 Minutes Intravenous On call to O.R. 02/24/13 [id number] 02/24/13 1053     and started on DVT prophylaxis in the form of Lovenox, Coumadin, TED hose and SCDs.   PT and OT were ordered for total joint protocol.  Discharge planning consulted to help with postop disposition and equipment needs.  Patient had a good night on the evening of surgery.  They started to get up OOB  with therapy on day one. Hemovac drain was pulled without difficulty.  Continued to work with therapy into day two. By day three, the patient had progressed with therapy and meeting their goals.  Incision was healing well.  Patient was seen in rounds and was ready for D/C to SNF- anticipate D/C POD #3 or #4.   Discharge Medications: Prior to Admission medications   Medication Sig Start Date End Date Taking? Authorizing Provider  clonazePAM (KLONOPIN) 0.5 MG tablet Take 0.5 mg by mouth 2 (two) times daily. 05/15/11  Yes Jeoffrey Massed, MD  enoxaparin (LOVENOX) 100 MG/ML injection Inject 100 mg into the skin 2 (two) times daily. Beginning 03/22/13 thru 02/23/13 per patient   Yes Historical Provider, MD  fenofibrate 160 MG tablet Take 160 mg by mouth daily.    Yes Historical Provider, MD  furosemide (LASIX) 20 MG tablet Take 20 mg by mouth every morning.   Yes Historical Provider, MD  HYDROcodone-acetaminophen (NORCO) 7.5-325 MG per tablet Take 1  tablet by mouth every 4 (four) hours as needed for pain.   Yes Historical Provider, MD  lamoTRIgine (LAMICTAL) 200 MG tablet Take 200 mg by mouth at bedtime.   Yes Historical Provider, MD  metoprolol succinate (TOPROL-XL) 25 MG 24 hr tablet Take 25 mg by mouth every morning.   Yes Historical Provider, MD  pantoprazole (PROTONIX) 40 MG tablet Take 40 mg by mouth daily.   Yes Historical Provider, MD  pravastatin (PRAVACHOL) 40 MG tablet Take 1 tablet (40 mg total) by mouth at bedtime. 05/15/11  Yes Jeoffrey Massed, MD  Probiotic Product (ALIGN) 4 MG CAPS Take 1 capsule by mouth daily. 11/18/10  Yes Bradd Canary, MD  Propylene Glycol (SYSTANE BALANCE) 0.6 % SOLN Place 1 drop into both eyes at bedtime.    Yes Historical Provider, MD  QUEtiapine (SEROQUEL) 200 MG tablet Take 400 mg by mouth at bedtime. 2- 200mg  tablets at bedtime   Yes Historical Provider, MD  venlafaxine XR (EFFEXOR-XR) 150 MG 24 hr capsule Take 150 mg by mouth every morning.    Yes Historical Provider, MD  acetaminophen (TYLENOL) 325 MG tablet Take 650 mg by mouth every 6 (six) hours as needed for pain.     Historical Provider, MD  aspirin 81 MG tablet Take 81 mg by mouth daily.     Historical Provider, MD  B Complex-C (B-COMPLEX WITH VITAMIN C) tablet Take 1 tablet by mouth daily.    Historical Provider, MD  Cholecalciferol (VITAMIN D) 2000 UNITS tablet Take 2,000 Units by mouth daily.    Historical Provider, MD  Docusate Calcium (STOOL SOFTENER PO) Take 4 capsules by mouth at bedtime.     Historical Provider, MD  Boris Lown Oil 300 MG CAPS Take 300 mg by mouth daily.    Historical Provider, MD  Multiple Vitamin (MULTIVITAMIN WITH MINERALS) TABS tablet Take 1 tablet by mouth daily.    Historical Provider, MD  oxyCODONE-acetaminophen (PERCOCET) 5-325 MG per tablet Take 1-2 tablets by mouth every 4 (four) hours as needed for pain. 02/24/13   Javier Docker, MD  oxyCODONE-acetaminophen (PERCOCET) 5-325 MG per tablet Take 1-2 tablets by  mouth every 4 (four) hours as needed for pain. 02/24/13   Javier Docker, MD  vitamin C (ASCORBIC ACID) 500 MG tablet Take 500 mg by mouth daily.    Historical Provider, MD  warfarin (COUMADIN) 5 MG tablet Take 5 mg by mouth daily. 1/2 to 1 tab: alternating days 5mg  on Mondays  and Fridays 2.5mg  all other days    Historical Provider, MD    Diet: Regular diet Activity:WBAT Follow-up:in 10-14 days Disposition - Skilled nursing facility Discharged Condition: good    Future Appointments Provider Department Dept Phone   04/01/2013 2:00 PM Bradd Canary, MD  [facility]  [facility] [phone]   04/05/2013 10:00 AM Lesleigh Noe, MD [facility] [phone]       Medication List    TAKE these medications       oxyCODONE-acetaminophen 5-325 MG per tablet  Commonly known as:  PERCOCET  Take 1-2 tablets by mouth every 4 (four) hours as needed for pain.     oxyCODONE-acetaminophen 5-325 MG per tablet  Commonly known as:  PERCOCET  Take 1-2 tablets by mouth every 4 (four) hours as needed for pain.      ASK your doctor about these medications       acetaminophen 325 MG tablet  Commonly known as:  TYLENOL  Take 650 mg by mouth every 6 (six) hours as needed for pain.     ALIGN 4 MG Caps  Take 1 capsule by mouth daily.     aspirin 81 MG tablet  Take 81 mg by mouth daily.     B-complex with vitamin C tablet  Take 1 tablet by mouth daily.     clonazePAM 0.5 MG tablet  Commonly known as:  KLONOPIN  Take 0.5 mg by mouth 2 (two) times daily.     enoxaparin 100 MG/ML injection  Commonly known as:  LOVENOX  Inject 100 mg into the skin 2 (two) times daily. Beginning 03/22/13 thru 02/23/13 per patient     fenofibrate 160 MG tablet  Take 160 mg by mouth daily.     furosemide 20 MG tablet  Commonly known as:  LASIX  Take 20 mg by mouth every morning.     HYDROcodone-acetaminophen 7.5-325 MG per tablet  Commonly known as:  NORCO  Take 1 tablet by  mouth every 4 (four) hours as needed for pain.     Krill Oil 300 MG Caps  Take 300 mg by mouth daily.     lamoTRIgine 200 MG tablet  Commonly known as:  LAMICTAL  Take 200 mg by mouth at bedtime.     metoprolol succinate 25 MG 24 hr tablet  Commonly known as:  TOPROL-XL  Take 25 mg by mouth every morning.     multivitamin with minerals Tabs tablet  Take 1 tablet by mouth daily.     pantoprazole 40 MG tablet  Commonly known as:  PROTONIX  Take 40 mg by mouth daily.     pravastatin 40 MG tablet  Commonly known as:  PRAVACHOL  Take 1 tablet (40 mg total) by mouth at bedtime.     QUEtiapine 200 MG tablet  Commonly known as:  SEROQUEL  Take 400 mg by mouth at bedtime. 2- 200mg  tablets at bedtime     STOOL SOFTENER PO  Take 4 capsules by mouth at bedtime.     SYSTANE BALANCE 0.6 % Soln  Generic drug:  Propylene Glycol  Place 1 drop into both eyes at bedtime.     venlafaxine XR 150 MG 24 hr capsule  Commonly known as:  EFFEXOR-XR  Take 150 mg by mouth every morning.     vitamin C 500 MG tablet  Commonly known as:  ASCORBIC ACID  Take 500 mg by mouth daily.     Vitamin D 2000 UNITS  tablet  Take 2,000 Units by mouth daily.     warfarin 5 MG tablet  Commonly known as:  COUMADIN  Take 5 mg by mouth daily. 1/2 to 1 tab: alternating days 5mg  on Mondays  and Fridays 2.5mg  all other days      bridging with lovenox to coumadin upon D/C     Follow-up Information   Follow up with BEANE,JEFFREY C, MD In 2 weeks.   Specialty:  Orthopedic Surgery   Contact information:   [address] [place] [address] [phone]       Signed: Dorothy Spark. 02/25/2013, 8:28 AM

## 2013-02-25 NOTE — Progress Notes (Addendum)
Physical Therapy Treatment Patient Details Name: [name] MRN: [id number] DOB: June 30, 1950 Today's Date: 02/25/2013 Time: 4098-1191 PT Time Calculation (min): 14 min  PT Assessment / Plan / Recommendation  History of Present Illness s/p R TKA 10/2. Hx of bipolar d/o, chronic fatigue syndrome, anxiety   PT Comments   Pt not progressing this session. Very drowsy/unable to keep eyes open during session. Pt required increased assistance this session and was not able to attempt ambulation. RN aware. Recommend SNF.   Follow Up Recommendations  SNF     Does the patient have the potential to tolerate intense rehabilitation     Barriers to Discharge        Equipment Recommendations  None recommended by PT    Recommendations for Other Services OT consult  Frequency 7X/week   Progress towards PT Goals Progress towards PT goals: Not progressing toward goals - comment (too drowsy to participate meaningfully)  Plan Current plan remains appropriate    Precautions / Restrictions Precautions Precautions: Knee;Fall Required Braces or Orthoses: Knee Immobilizer - Right Knee Immobilizer - Right: Discontinue once straight leg raise with < 10 degree lag Restrictions Weight Bearing Restrictions: No RLE Weight Bearing: Weight bearing as tolerated   Pertinent Vitals/Pain R LE with activity-pt unable to rate    Mobility  Bed Mobility Bed Mobility: Supine to Sit;Sit to Supine Supine to Sit: 1: +2 Total assist Supine to Sit: Patient Percentage: 30% Sit to Supine: 1: +2 Total assist Sit to Supine: Patient Percentage: 30% Details for Bed Mobility Assistance: Assist for trunk and R LE. Increased time. Multimodal cues for safety, technique, hand placement. Utilized bed pad for scooting, positioning.  Transfers Transfers: Sit to Stand;Stand to Sit Sit to Stand: 1: +2 Total assist;From bed;From elevated surface Sit to Stand: Patient Percentage: 40% Stand to Sit: 1: +2 Total assist;To elevated  surface Stand to Sit: Patient Percentage: 40% Details for Transfer Assistance: Multimodal cues for safety, technique, hand placement. Assist to rise, stabilize, control descent. Attempted side steps to HOB-pt with significant difficulty-unable to maintain full upright posture, shift weight laterally, take weight through R LE. Deferred further attempt at ambulation due to drowsiness/decreased level of arousal    Exercises     PT Diagnosis:    PT Problem List:   PT Treatment Interventions:     PT Goals (current goals can now be found in the care plan section)    Visit Information  Last PT Received On: 02/25/13 Assistance Needed: +2 History of Present Illness: s/p R TKA 10/2. Hx of bipolar d/c, chronic fatigue syndrome, anxiety    Subjective Data      Cognition  Cognition Arousal/Alertness: Lethargic Behavior During Therapy: Flat affect Overall Cognitive Status: Difficult to assess Difficult to assess due to: Level of arousal    Balance     End of Session PT - End of Session Equipment Utilized During Treatment: Gait belt Activity Tolerance: Patient limited by fatigue;Patient limited by pain;Patient limited by lethargy Patient left: in bed;with call bell/phone within reach Nurse Communication: Mobility status (performance this pm)   GP     Rebeca Alert, MPT Pager: [phone]

## 2013-02-25 NOTE — Op Note (Signed)
[name], [name]                 ACCOUNT NO.:  [id number]  MEDICAL RECORD NO.:  [id number]  LOCATION:  1607                         FACILITY:  [facility]  PHYSICIAN:  Jene Every, M.D.    DATE OF BIRTH:  November 11, 1950  DATE OF PROCEDURE:  02/24/2013 DATE OF DISCHARGE:                              OPERATIVE REPORT   PREOPERATIVE DIAGNOSIS:  End-stage osteoarthrosis of the right knee, medial compartment, slight varus deformity.  POSTOPERATIVE DIAGNOSIS:  End-stage osteoarthrosis of the right knee, medial compartment, slight varus deformity.  PROCEDURE PERFORMED:  Right total knee arthroplasty utilizing DePuy rotating platform, 4 femur, 3 tibia, 38 patella, 15-mm insert.  ANESTHESIA:  General.  ASSISTANT:  Skip Mayer, PA.  HISTORY:  A 62 year old end-stage osteoarthrosis medial compartment, bone-on-bone of the right knee refractory conservative treatment to include rest activity modification, corticosteroid injections, Visco supplementation, and had severe bone-on-bone arthrosis pain, disabling ambulation, assistive device with severe negative impact on her activities of daily living.  She was indicated for replacement of the degenerative joint.  Risk and benefits discussed including bleeding, infection, damage to neurovascular structures, DVT, PE, anesthetic complications, suboptimal range of motion, etc.  TECHNIQUE:  With the patient in supine position, after induction of adequate general anesthesia and 2 g Kefzol, the right lower extremity was prepped draped in the usual sterile fashion.  Thigh tourniquet inflated to 300 mmHg.  Midline incision was made.  Full thickness flaps were developed.  Medial parapatellar arthrotomy was performed.  Patella everted, knee flexed.  Tricompartmental bone-on-bone arthrosis was noted.  The medial lateral menisci were removed.  The remnants of as well as the ACL.  We elevated the soft tissues medially preserving the MCL.  Step drill was  utilized in the femoral canal, irrigated, 5 degree right placed, 10 off the distal femur.  It was pinned, distal femoral cut was made.  It was sized off the anterior cortex of the femur to a 4. Pinned it in at 4.  Performed anterior-posterior and chamfer cuts after the block was applied.  Turned attention towards the tibia subluxed it, and used the external alignment guide, 2 off the medial side.  Well bisecting the ankle in line with the first and second rays parallel to the tibia 2 degree slope thin.  Oscillating saw performed the cut. Subluxed it with Kales, it was maximized at 3, 4 was too big.  Distal medial aspect of tibial tubercle, pinned, drilled essentially, punch guide utilized.  Turned our attention towards completing the femur, using our box cut.  This was applied bisecting the canal.  Oscillating saw performed the box cut.  Trial femur and tibial placed, a 12.5 mm insert was placed.  Slight hyperextension was noted, but good stability varus valgus stress in 0-30 degrees.  Good patellofemoral tracking. Returned our attention towards the patella, measured at 24.  We planed it to a 15, it was 38 size.  Using the patellar planer, drilled our PEG holes medializing those, placed the trial patellar button inset flush. Reduced it, an excellent patellofemoral tracking was noted.  All trials were removed.  We checked posteriorly and cauterized the geniculate. Popliteus was intact.  The residual PCL removed.  Copiously irrigated with pulsatile lavage, mixed the cement on back table in appropriate fashion.  The flexed Jorene Minors was utilized.  All bony surfaces were well dried.  We injected cement in the proximal tibia, digitally pressurizing it, and then impacted the 3 permanent tray.  Redundant cement was removed.  Cemented the femur, femoral component, and redundant cement removed.  Attention placed a full 5 insert, reduced and held it with an axial load throughout the case.  Cemented the  patella as well.  After appropriate curing of the cement, there was slight laxity at 30 degrees, used a 15 then she had full extension, full flexion, good stability, varus valgus stressing 0-30 degrees.  We placed a permanent 15 in there. Again, full extension, full flexion, good stability with varus valgus stressing at 0 and 30 degrees and excellent patellofemoral tracking.  We placed a Hemovac and brought out through a lateral stab wound and skin. Marcaine with epinephrine was utilized to infiltrate periosteum. Exparel in the paraspinous musculature in the para-articular musculature and in soft tissues.  Reapproximated the patellar arthrotomy with 1 Vicryl, and then a running Quill locking suture with the knee slightly flexed with flexion to gravity at 90 degrees.  Subcutaneous with 2-0 skin and subcuticular.  The tourniquet was deflated and there was adequate revascularization appreciated.  Sterile dressing was applied. Prior to placing the plate with permanent insert, we checked posteriorly removed any redundant cement with an osteotome Pick-ups meticulously. Copiously irrigated the wound with antibiotic irrigation as well.  The patient tolerated the procedure well.  No complications.  Assistant, Skip Mayer, PA, was used for retraction, holding, manipulation of the knee.  Minimal blood loss.  As the tourniquet time was 85 minutes.     Jene Every, M.D.     Cordelia Pen  D:  02/24/2013  T:  02/25/2013  Job:  [id number]

## 2013-02-25 NOTE — Care Management Note (Signed)
    Page 1 of 1   02/25/2013     1:45:07 PM   CARE MANAGEMENT NOTE 02/25/2013  Patient:  [name], [name]   Account Number:  [id number]  Date Initiated:  02/25/2013  Documentation initiated by:  Colleen Can  Subjective/Objective Assessment:   dx rt total knee arthroplasty     Action/Plan:   Pt plans SNF rehab.   Anticipated DC Date:  02/27/2013   Anticipated DC Plan:  SKILLED NURSING FACILITY  In-house referral  Clinical Social Worker      DC Planning Services  CM consult      Choice offered to / List presented to:             Status of service:  Completed, signed off Medicare Important Message given?   (If response is "NO", the following Medicare IM given date fields will be blank) Date Medicare IM given:   Date Additional Medicare IM given:    Discharge Disposition:    Per UR Regulation:    If discussed at Long Length of Stay Meetings, dates discussed:    Comments:  02/25/2013 Colleen Can BSN RN CCM [phone] CM will follow as needed.

## 2013-02-25 NOTE — Progress Notes (Signed)
Subjective: 1 Day Post-Op Procedure(s) (LRB): RIGHT TOTAL KNEE ARTHROPLASTY (Right) Patient reports pain as 4 on 0-10 scale.    Objective: Vital signs in last 24 hours: Temp:  [98.4 F (36.9 C)-99.5 F (37.5 C)] 98.6 F (37 C) (10/03 0605) Pulse Rate:  [53-82] 74 (10/03 0605) Resp:  [11-20] 19 (10/03 0800) BP: (130-174)/(59-85) 138/74 mmHg (10/03 0605) SpO2:  [92 %-100 %] 97 % (10/03 0800) Weight:  [101 kg (222 lb 10.6 oz)] 101 kg (222 lb 10.6 oz) (10/02 1607)  Intake/Output from previous day: 10/02 0701 - 10/03 0700 In: 2753.8 [P.O.:480; I.V.:2273.8] Out: 2362 [Urine:2025; Drains:237; Blood:100] Intake/Output this shift:     Recent Labs  02/24/13 1601 02/25/13 0415  HGB 10.4* 9.9*    Recent Labs  02/24/13 1601 02/25/13 0415  WBC 13.1* 14.0*  RBC 3.42* 3.28*  HCT 31.1* 30.2*  PLT 238 250    Recent Labs  02/24/13 1601 02/25/13 0415  NA  --  138  K  --  4.2  CL  --  102  CO2  --  27  BUN  --  12  CREATININE 1.03 0.88  GLUCOSE  --  162*  CALCIUM  --  8.9    Recent Labs  02/24/13 0900 02/25/13 0415  INR 1.11 1.17    Neurologically intact Sensation intact distally Incision: dressing C/D/I  Assessment/Plan: 1 Day Post-Op Procedure(s) (LRB): RIGHT TOTAL KNEE ARTHROPLASTY (Right) Advance diet Up with therapy D/C IV fluids  Melitta Tigue C 02/25/2013, 1:14 PM

## 2013-02-25 NOTE — Progress Notes (Signed)
ANTICOAGULATION CONSULT NOTE - follow-up  Pharmacy Consult for warfarin Indication: VTE prophylaxis and h/o St. Jude AVR  Allergies  Allergen Reactions  . Sulfonamide Derivatives     headaches    Patient Measurements: Height: 5\' 8"  (172.7 cm) Weight: 222 lb 10.6 oz (101 kg) IBW/kg (Calculated) : 63.9  Vital Signs: Temp: 98.6 F (37 C) (10/03 0605) Temp src: Oral (10/03 0605) BP: 138/74 mmHg (10/03 0605) Pulse Rate: 74 (10/03 0605)  Labs:  Recent Labs  02/24/13 0900 02/24/13 1601 02/25/13 0415  HGB  --  10.4* 9.9*  HCT  --  31.1* 30.2*  PLT  --  238 250  LABPROT 14.1  --  14.7  INR 1.11  --  1.17  CREATININE  --  1.03 0.88    Estimated Creatinine Clearance: 82.4 ml/min (by C-G formula based on Cr of 0.88).   Medical History: Past Medical History  Diagnosis Date  . Anemia   . Anxiety   . Depression   . Diverticulosis   . Elevated cholesterol   . Obesity   . THYROMEGALY 07/22/2010  . Overweight(278.02) 03/11/2010  . Mixed hyperlipidemia 03/11/2010  . MEASLES, HX OF 03/11/2010  . KNEE PAIN, RIGHT 05/28/2010  . HYPERTRIGLYCERIDEMIA 03/11/2010  . HEART MURMUR, HX OF 03/11/2010  . FIBROIDS, UTERUS 03/11/2010  . Diarrhea 08/05/2010  . CONSTIPATION 03/11/2010  . CHICKENPOX, HX OF 03/11/2010  . BIPOLAR AFFECTIVE DISORDER 03/11/2010  . AORTIC VALVE REPLACEMENT, HX OF 03/11/2010  . ANEMIA 05/28/2010  . Acute bronchitis 07/22/2010  . Abdominal pain, generalized 03/19/2010  . Peripheral edema 11/18/2010  . Bacterial vaginitis 11/18/2010  . Hematuria 12/18/2010  . Diverticulitis 12/18/2010  . Hyperthyroidism 12/18/2010  . Fatigue 12/30/2010  . Renal insufficiency 12/30/2010  . Grave's disease 8-12  . Arm lesion 03/23/2011  . UTI (lower urinary tract infection) 08/14/2011  . Breast pain in female 11/17/2011  . Chest pain 11/17/2011  . Hematoma 12/18/2011  . Cervical cancer screening 03/22/2012  . Foot pain, left 03/22/2012  . Foot fracture, left 03/22/2012  . Tick bite  of back 10/15/2012  . Preop examination 02/03/2013  . Dysrhythmia   . Hypertension   . Shortness of breath     with exertion   . Heart murmur   . Urinary tract bacterial infections   . Arthritis     Assessment: 17 YOF s/p R TKA.  Prior to admission she was on [facility] bridge while warfarin on hold for surgery.  She has h/o St. Jude AVR in 2009.  Coumadin dosing prior to holding for OR, coumadin 5mg  on MF and 2.5mg  all other days  Baseline INR = 1.17  Preop Hgb = 11.6, Current Hgb = 9.9, plts WNL  Started on lovenox 30mg  SQ q12h  Goal of Therapy:  INR 2-3 Monitor platelets by anticoagulation protocol: Yes   Plan:   Repeat Coumadin 5mg  PO tonight  Continue Lovenox 30mg  SQ q12h, can consider increasing lovenox to 100mg  SQ q12h 72h post-op to reduce bleeding risk   Daily INR  Juliette Alcide, PharmD, BCPS.   Pager: [phone]  02/25/2013,7:22 AM

## 2013-02-26 LAB — CBC
HCT: 26.7 % — ABNORMAL LOW (ref 36.0–46.0)
Hemoglobin: 8.8 g/dL — ABNORMAL LOW (ref 12.0–15.0)
MCH: 30.6 pg (ref 26.0–34.0)
MCHC: 33 g/dL (ref 30.0–36.0)
MCV: 92.7 fL (ref 78.0–100.0)
Platelets: 204 10*3/uL (ref 150–400)
RBC: 2.88 MIL/uL — ABNORMAL LOW (ref 3.87–5.11)
RDW: 14.4 % (ref 11.5–15.5)
WBC: 10.7 10*3/uL — ABNORMAL HIGH (ref 4.0–10.5)

## 2013-02-26 LAB — PROTIME-INR
INR: 1.48 (ref 0.00–1.49)
Prothrombin Time: 17.5 seconds — ABNORMAL HIGH (ref 11.6–15.2)

## 2013-02-26 MED ORDER — WARFARIN SODIUM 2.5 MG PO TABS
2.5000 mg | ORAL_TABLET | Freq: Once | ORAL | Status: AC
  Administered 2013-02-26: 2.5 mg via ORAL
  Filled 2013-02-26: qty 1

## 2013-02-26 NOTE — Progress Notes (Signed)
Physical Therapy Treatment Patient Details Name: [name] MRN: [id number] DOB: 1951-01-31 Today's Date: 02/26/2013 Time: 1105-1130 PT Time Calculation (min): 25 min  PT Assessment / Plan / Recommendation  History of Present Illness s/p R TKA 10/2. Hx of bipolar d/c, chronic fatigue syndrome, anxiety   PT Comments   Progressing slowly. Mobility limited by pain, fatigue.   Follow Up Recommendations  SNF     Does the patient have the potential to tolerate intense rehabilitation     Barriers to Discharge        Equipment Recommendations  None recommended by PT    Recommendations for Other Services OT consult  Frequency 7X/week   Progress towards PT Goals Progress towards PT goals: Progressing toward goals (slowly)  Plan Current plan remains appropriate    Precautions / Restrictions Precautions Precautions: Knee;Fall Required Braces or Orthoses: Knee Immobilizer - Right Knee Immobilizer - Right: Discontinue once straight leg raise with < 10 degree lag Restrictions Weight Bearing Restrictions: No RLE Weight Bearing: Weight bearing as tolerated   Pertinent Vitals/Pain 7/10 R knee with activity. Ice applied end of session    Mobility  Bed Mobility Bed Mobility: Supine to Sit Supine to Sit: 3: Mod assist;HOB elevated;With rails Details for Bed Mobility Assistance: Assist for trunk and R LE. Increased time. Multimodal cues for safety, technique, hand placement. Utilized bed pad for scooting, positioning.  Transfers Transfers: Sit to Stand;Stand to Sit Sit to Stand: 1: +2 Total assist;From bed;From chair/3-in-1 Sit to Stand: Patient Percentage: 50% Stand to Sit: 1: +2 Total assist;To chair/3-in-1 Stand to Sit: Patient Percentage: 50% Details for Transfer Assistance: Assis to rise, stabilize, control descent. Pt attempts to sit too soon. Multimodal cues for safety, technique, hand placement.  Ambulation/Gait Ambulation/Gait Assistance: 1: +2 Total assist Ambulation/Gait:  Patient Percentage: 70% Ambulation Distance (Feet): 5 Feet Assistive device: Rolling walker Ambulation/Gait Assistance Details: Assist to rise, stabilize/support pt, maneuver with RW. Fatigues easily. Multimodal cues for safety, posture, sequencing, distance from RW, step length.  Gait Pattern: Step-to pattern;Decreased stride length;Decreased step length - left;Decreased step length - right;Trunk flexed;Narrow base of support;Decreased weight shift to right    Exercises Total Joint Exercises Ankle Circles/Pumps: AROM;Both;10 reps;Supine Quad Sets: AROM;Both;10 reps;Supine Heel Slides: AAROM;Right;10 reps;Supine Straight Leg Raises: AAROM;Right;10 reps;Supine   PT Diagnosis:    PT Problem List:   PT Treatment Interventions:     PT Goals (current goals can now be found in the care plan section)    Visit Information  Last PT Received On: 02/26/13 Assistance Needed: +2 History of Present Illness: s/p R TKA 10/2. Hx of bipolar d/c, chronic fatigue syndrome, anxiety    Subjective Data      Cognition  Cognition Arousal/Alertness: Awake/alert Behavior During Therapy: WFL for tasks assessed/performed Overall Cognitive Status: Within Functional Limits for tasks assessed Memory: Decreased short-term memory;Decreased recall of precautions    Balance     End of Session PT - End of Session Equipment Utilized During Treatment: Gait belt Activity Tolerance: Patient limited by fatigue;Patient limited by pain Patient left: in chair;with call bell/phone within reach   GP     Rebeca Alert, MPT Pager: [phone]

## 2013-02-26 NOTE — Progress Notes (Signed)
Spoke with [name] via phone.  Answered SNF questions and questions re: ins auth status.  Weekday CSW to follow.  Providence Crosby, LCSWA Clinical Social Work [phone]

## 2013-02-26 NOTE — Progress Notes (Signed)
ANTICOAGULATION CONSULT NOTE - follow-up  Pharmacy Consult for warfarin Indication: VTE prophylaxis and h/o St. Jude AVR  Allergies  Allergen Reactions  . Sulfonamide Derivatives     headaches    Patient Measurements: Height: 5\' 8"  (172.7 cm) Weight: 222 lb 10.6 oz (101 kg) IBW/kg (Calculated) : 63.9  Vital Signs: Temp: 98.8 F (37.1 C) (10/04 1356) Temp src: Oral (10/04 1356) BP: 135/70 mmHg (10/04 1356) Pulse Rate: 85 (10/04 1356)  Labs:  Recent Labs  02/24/13 0900  02/24/13 1601 02/25/13 0415 02/26/13 0426  HGB  --   < > 10.4* 9.9* 8.8*  HCT  --   --  31.1* 30.2* 26.7*  PLT  --   --  238 250 204  LABPROT 14.1  --   --  14.7 17.5*  INR 1.11  --   --  1.17 1.48  CREATININE  --   --  1.03 0.88  --   < > = values in this interval not displayed.  Estimated Creatinine Clearance: 82.4 ml/min (by C-G formula based on Cr of 0.88).   Medical History: Past Medical History  Diagnosis Date  . Anemia   . Anxiety   . Depression   . Diverticulosis   . Elevated cholesterol   . Obesity   . THYROMEGALY 07/22/2010  . Overweight(278.02) 03/11/2010  . Mixed hyperlipidemia 03/11/2010  . MEASLES, HX OF 03/11/2010  . KNEE PAIN, RIGHT 05/28/2010  . HYPERTRIGLYCERIDEMIA 03/11/2010  . HEART MURMUR, HX OF 03/11/2010  . FIBROIDS, UTERUS 03/11/2010  . Diarrhea 08/05/2010  . CONSTIPATION 03/11/2010  . CHICKENPOX, HX OF 03/11/2010  . BIPOLAR AFFECTIVE DISORDER 03/11/2010  . AORTIC VALVE REPLACEMENT, HX OF 03/11/2010  . ANEMIA 05/28/2010  . Acute bronchitis 07/22/2010  . Abdominal pain, generalized 03/19/2010  . Peripheral edema 11/18/2010  . Bacterial vaginitis 11/18/2010  . Hematuria 12/18/2010  . Diverticulitis 12/18/2010  . Hyperthyroidism 12/18/2010  . Fatigue 12/30/2010  . Renal insufficiency 12/30/2010  . Grave's disease 8-12  . Arm lesion 03/23/2011  . UTI (lower urinary tract infection) 08/14/2011  . Breast pain in female 11/17/2011  . Chest pain 11/17/2011  . Hematoma 12/18/2011   . Cervical cancer screening 03/22/2012  . Foot pain, left 03/22/2012  . Foot fracture, left 03/22/2012  . Tick bite of back 10/15/2012  . Preop examination 02/03/2013  . Dysrhythmia   . Hypertension   . Shortness of breath     with exertion   . Heart murmur   . Urinary tract bacterial infections   . Arthritis     Assessment: 21 YOF s/p R TKA.  Prior to admission she was on [facility] bridge while warfarin on hold for surgery.  She has h/o St. Jude AVR in 2009.  Coumadin dosing prior to holding for OR, coumadin 5mg  on MF and 2.5mg  all other days  10/4: INR increased to 1.48 after 2 doses coumadin  CBC: Small decrease in Hgb, no bleeding documented  Warfarin bridged with lovenox 30mg  SQ q12h  Goal of Therapy:  INR 2-3 Monitor platelets by anticoagulation protocol: Yes   Plan:   Coumadin 2.5mg  PO tonight  Continue Lovenox 30mg  SQ q12h, can consider increasing lovenox to 100mg  SQ q12h 72h post-op  Daily INR  Haynes Hoehn, PharmD 02/26/2013, 3:24 PM  Pager: [phone]

## 2013-02-26 NOTE — Progress Notes (Signed)
Physical Therapy Treatment Patient Details Name: [name] MRN: [id number] DOB: Nov 09, 1950 Today's Date: 02/26/2013 Time: 4098-1191 PT Time Calculation (min): 20 min  PT Assessment / Plan / Recommendation  History of Present Illness s/p R TKA 10/2. Hx of bipolar d/c, chronic fatigue syndrome, anxiety   PT Comments   Progressing slowly. Pt seemed more confused this session. Discussed with pt's daughter the need for rehab.   Follow Up Recommendations  SNF     Does the patient have the potential to tolerate intense rehabilitation     Barriers to Discharge        Equipment Recommendations  None recommended by PT    Recommendations for Other Services OT consult  Frequency 7X/week   Progress towards PT Goals Progress towards PT goals: Progressing toward goals (slowly)  Plan Current plan remains appropriate    Precautions / Restrictions Precautions Precautions: Knee;Fall Required Braces or Orthoses: Knee Immobilizer - Right Knee Immobilizer - Right: Discontinue once straight leg raise with < 10 degree lag Restrictions Weight Bearing Restrictions: No RLE Weight Bearing: Weight bearing as tolerated   Pertinent Vitals/Pain R knee-unrated. Ice applied end of session    Mobility  Bed Mobility Bed Mobility: Supine to Sit;Sit to Supine Supine to Sit: 3: Mod assist Sit to Supine: 2: Max assist Details for Bed Mobility Assistance: Assist for trunk and R LE. Increased time. Multimodal cues for safety, technique, hand placement. Utilized bed pad for scooting, positioning.  Transfers Transfers: Sit to Stand;Stand to Sit Sit to Stand: 1: +2 Total assist;From chair/3-in-1;From bed Sit to Stand: Patient Percentage: 50% Stand to Sit: 1: +2 Total assist;To bed;To chair/3-in-1 Stand to Sit: Patient Percentage: 50% Details for Transfer Assistance: x2 Assist  to rise, stabilize, control descent. Pt attempts to sit too soon. Multimodal cues for safety, technique, hand placement.   Ambulation/Gait Ambulation/Gait Assistance: 1: +2 Total assist Ambulation/Gait: Patient Percentage: 70% Ambulation Distance (Feet): 5' x2 Assistive device: Rolling walker Ambulation/Gait Assistance Details: Assist to rise, stabilize/support pt, maneuver with RW. Fatigues easily. Multimodal cues for safety, posture, sequencing, distance from rW, step length.  Gait Pattern: Step-to pattern;Decreased stride length;Decreased step length - left;Decreased step length - right;Trunk flexed;Narrow base of support;Decreased weight shift to right    PT Diagnosis:    PT Problem List:   PT Treatment Interventions:     PT Goals (current goals can now be found in the care plan section)    Visit Information  Last PT Received On: 02/26/13 Assistance Needed: +2 History of Present Illness: s/p R TKA 10/2. Hx of bipolar d/c, chronic fatigue syndrome, anxiety    Subjective Data      Cognition  Cognition Arousal/Alertness: Awake/alert Behavior During Therapy: WFL for tasks assessed/performed Overall Cognitive Status: History of cognitive impairments - at baseline Memory: Decreased recall of precautions;Decreased short-term memory    Balance     End of Session PT - End of Session Equipment Utilized During Treatment: Gait belt Activity Tolerance: Patient limited by fatigue;Patient limited by pain Patient left: in bed;with call bell/phone within reach;with family/visitor present   GP     Rebeca Alert, MPT Pager: [phone]

## 2013-02-26 NOTE — Progress Notes (Signed)
Subjective: 2 Days Post-Op Procedure(s) (LRB): RIGHT TOTAL KNEE ARTHROPLASTY (Right) Patient reports pain as 1 on 0-10 scale.  Pt doing well this AM.  Pain well tolerated, pt able to transfer to chair and walk to bathroom.  Pt denies N/V/F/C, SOB, chest pain, calf pain and paresthesia bilaterally.  Objective: Vital signs in last 24 hours: Temp:  [98.8 F (37.1 C)-101.1 F (38.4 C)] 99.8 F (37.7 C) (10/04 0544) Pulse Rate:  [65-79] 67 (10/04 0544) Resp:  [16-19] 16 (10/04 0544) BP: (100-150)/(66-70) 124/67 mmHg (10/04 0544) SpO2:  [94 %-100 %] 94 % (10/04 0544)  Intake/Output from previous day: 10/03 0701 - 10/04 0700 In: 2157.6 [P.O.:360; I.V.:1797.6] Out: 1350 [Urine:1350] Intake/Output this shift:     Recent Labs  02/24/13 1601 02/25/13 0415 02/26/13 0426  HGB 10.4* 9.9* 8.8*    Recent Labs  02/25/13 0415 02/26/13 0426  WBC 14.0* 10.7*  RBC 3.28* 2.88*  HCT 30.2* 26.7*  PLT 250 204    Recent Labs  02/24/13 1601 02/25/13 0415  NA  --  138  K  --  4.2  CL  --  102  CO2  --  27  BUN  --  12  CREATININE 1.03 0.88  GLUCOSE  --  162*  CALCIUM  --  8.9    Recent Labs  02/25/13 0415 02/26/13 0426  INR 1.17 1.48    WD WN female NAD A/Ox3, pt appears stated age.  Dressing c/d/i, negative Homan's sign, Compartments soft.  Lower extremtities NVI with good motor control of plantar/dorsi flexion of great toes and feet bilaterally.  No evidence of compartment syndrome noted  Assessment/Plan: 2 Days Post-Op Procedure(s) (LRB): RIGHT TOTAL KNEE ARTHROPLASTY (Right) Pt will continue PT Continue coumadin and bridge with Lovenox if subtherapeutic D/c SNF tomorrow   FLOWERS, CHRISTOPHER S 02/26/2013, 7:45 AM

## 2013-02-27 LAB — CBC
HCT: 24.4 % — ABNORMAL LOW (ref 36.0–46.0)
Hemoglobin: 8.1 g/dL — ABNORMAL LOW (ref 12.0–15.0)
MCH: 30.6 pg (ref 26.0–34.0)
MCHC: 33.2 g/dL (ref 30.0–36.0)
MCV: 92.1 fL (ref 78.0–100.0)
Platelets: 202 10*3/uL (ref 150–400)
RBC: 2.65 MIL/uL — ABNORMAL LOW (ref 3.87–5.11)
RDW: 14.6 % (ref 11.5–15.5)
WBC: 8.9 10*3/uL (ref 4.0–10.5)

## 2013-02-27 LAB — HEMOGLOBIN AND HEMATOCRIT, BLOOD
HCT: 30 % — ABNORMAL LOW (ref 36.0–46.0)
Hemoglobin: 10 g/dL — ABNORMAL LOW (ref 12.0–15.0)

## 2013-02-27 LAB — PROTIME-INR
INR: 1.39 (ref 0.00–1.49)
Prothrombin Time: 16.7 seconds — ABNORMAL HIGH (ref 11.6–15.2)

## 2013-02-27 LAB — PREPARE RBC (CROSSMATCH)

## 2013-02-27 MED ORDER — ACETAMINOPHEN 325 MG PO TABS
650.0000 mg | ORAL_TABLET | Freq: Once | ORAL | Status: AC
  Administered 2013-02-27: 10:00:00 650 mg via ORAL
  Filled 2013-02-27: qty 2

## 2013-02-27 MED ORDER — DIPHENHYDRAMINE HCL 25 MG PO CAPS
25.0000 mg | ORAL_CAPSULE | Freq: Once | ORAL | Status: AC
  Administered 2013-02-27: 25 mg via ORAL
  Filled 2013-02-27: qty 1

## 2013-02-27 MED ORDER — WARFARIN SODIUM 5 MG PO TABS
5.0000 mg | ORAL_TABLET | Freq: Once | ORAL | Status: AC
  Administered 2013-02-27: 18:00:00 5 mg via ORAL
  Filled 2013-02-27: qty 1

## 2013-02-27 NOTE — Progress Notes (Signed)
ANTICOAGULATION CONSULT NOTE - follow-up  Pharmacy Consult for warfarin Indication: VTE prophylaxis and h/o St. Jude AVR  Allergies  Allergen Reactions  . Sulfonamide Derivatives     headaches    Patient Measurements: Height: 5\' 8"  (172.7 cm) Weight: 222 lb 10.6 oz (101 kg) IBW/kg (Calculated) : 63.9  Vital Signs: Temp: 98.6 F (37 C) (10/05 1310) Temp src: Oral (10/05 1310) BP: 120/70 mmHg (10/05 1310) Pulse Rate: 88 (10/05 1310)  Labs:  Recent Labs  02/24/13 1601 02/25/13 0415 02/26/13 0426 02/27/13 0432  HGB 10.4* 9.9* 8.8* 8.1*  HCT 31.1* 30.2* 26.7* 24.4*  PLT 238 250 204 202  LABPROT  --  14.7 17.5* 16.7*  INR  --  1.17 1.48 1.39  CREATININE 1.03 0.88  --   --     Estimated Creatinine Clearance: 82.4 ml/min (by C-G formula based on Cr of 0.88).   Medical History: Past Medical History  Diagnosis Date  . Anemia   . Anxiety   . Depression   . Diverticulosis   . Elevated cholesterol   . Obesity   . THYROMEGALY 07/22/2010  . Overweight(278.02) 03/11/2010  . Mixed hyperlipidemia 03/11/2010  . MEASLES, HX OF 03/11/2010  . KNEE PAIN, RIGHT 05/28/2010  . HYPERTRIGLYCERIDEMIA 03/11/2010  . HEART MURMUR, HX OF 03/11/2010  . FIBROIDS, UTERUS 03/11/2010  . Diarrhea 08/05/2010  . CONSTIPATION 03/11/2010  . CHICKENPOX, HX OF 03/11/2010  . BIPOLAR AFFECTIVE DISORDER 03/11/2010  . AORTIC VALVE REPLACEMENT, HX OF 03/11/2010  . ANEMIA 05/28/2010  . Acute bronchitis 07/22/2010  . Abdominal pain, generalized 03/19/2010  . Peripheral edema 11/18/2010  . Bacterial vaginitis 11/18/2010  . Hematuria 12/18/2010  . Diverticulitis 12/18/2010  . Hyperthyroidism 12/18/2010  . Fatigue 12/30/2010  . Renal insufficiency 12/30/2010  . Grave's disease 8-12  . Arm lesion 03/23/2011  . UTI (lower urinary tract infection) 08/14/2011  . Breast pain in female 11/17/2011  . Chest pain 11/17/2011  . Hematoma 12/18/2011  . Cervical cancer screening 03/22/2012  . Foot pain, left 03/22/2012  .  Foot fracture, left 03/22/2012  . Tick bite of back 10/15/2012  . Preop examination 02/03/2013  . Dysrhythmia   . Hypertension   . Shortness of breath     with exertion   . Heart murmur   . Urinary tract bacterial infections   . Arthritis     Assessment: 65 YOF s/p R TKA.  Prior to admission she was on LMWH bridge while warfarin on hold for surgery.  She has h/o St. Jude AVR in 2009.  Coumadin dosing prior to holding for OR, coumadin 5mg  on MF and 2.5mg  all other days  INR decreased overnight to 1.39, coumadin doses inpt 10/2-10/4: 5mg , 5mg , 2.5mg    CBC: Hgb on admission 10.4, today 8.1, receiving 2 units PRBCs. No bleeding noted by RN. Blood products may decrease effects of warfarin and lower INR.   Warfarin bridged with lovenox 30mg  SQ q12h  Goal of Therapy:  INR 2-3 Monitor platelets by anticoagulation protocol: Yes   Plan:   Coumadin 5mg  PO tonight  Continue Lovenox 30mg  SQ q12h  Daily INR  Haynes Hoehn, PharmD 02/27/2013, 1:32 PM  Pager: [phone]

## 2013-02-27 NOTE — Progress Notes (Signed)
   Subjective: 3 Days Post-Op Procedure(s) (LRB): RIGHT TOTAL KNEE ARTHROPLASTY (Right)  Pt c/o moderate pain to right knee this am Therapy going okay Plan for snf  Patient reports pain as moderate.  Objective:   VITALS:   Filed Vitals:   02/27/13 0525  BP: 107/67  Pulse: 81  Temp: 98.3 F (36.8 C)  Resp: 20    Right knee dressing intact  Ace removed nv intact distally No rashes or edema  LABS  Recent Labs  02/25/13 0415 02/26/13 0426 02/27/13 0432  HGB 9.9* 8.8* 8.1*  HCT 30.2* 26.7* 24.4*  WBC 14.0* 10.7* 8.9  PLT 250 204 202     Recent Labs  02/24/13 1601 02/25/13 0415  NA  --  138  K  --  4.2  BUN  --  12  CREATININE 1.03 0.88  GLUCOSE  --  162*     Assessment/Plan: 3 Days Post-Op Procedure(s) (LRB): RIGHT TOTAL KNEE ARTHROPLASTY (Right)  Acute blood loss anemia will transfuse today Plan for snf placement tomorrow PT/OT as able Pain management     Alphonsa Overall, MPAS, PA-C  02/27/2013, 7:17 AM

## 2013-02-27 NOTE — Progress Notes (Signed)
[name] Linton Rump has concerns about pt receiving blood.Marland KitchenMarland KitchenBrad Dixon,PA contacted and stated that blood could be cancelled if they objected to it.  Upon following up with pt she has decided that she wants her blood transfusion and does not want me to contact her daughter Amy about it any further.

## 2013-02-27 NOTE — Progress Notes (Signed)
OT Cancellation Note  Patient Details Name: [name] MRN: [id number] DOB: 12-Jul-1950   Cancelled Treatment:    Reason Eval/Treat Not Completed: Medical issues which prohibited therapy - pt moving slowly this morning with PT.  Reattempted second time, and RN hooking up second unit of blood.  Will reattempt OT eval  Jeani Hawking M [phone] 02/27/2013, 1:50 PM

## 2013-02-27 NOTE — Progress Notes (Signed)
Physical Therapy Treatment Patient Details Name: [name] MRN: [id number] DOB: 07-Jul-1950 Today's Date: 02/27/2013 Time: 0921-0930 PT Time Calculation (min): 9 min  PT Assessment / Plan / Recommendation  History of Present Illness s/p R TKA 10/2. Hx of bipolar d/c, chronic fatigue syndrome, anxiety   PT Comments   Pt OOB with nursing to recliner this am. Requesting to return to bed due to fatigue. ROM exercises in bed this am. Will check back later today to attempt ambulation.   Follow Up Recommendations  SNF     Does the patient have the potential to tolerate intense rehabilitation     Barriers to Discharge        Equipment Recommendations  None recommended by PT    Recommendations for Other Services OT consult  Frequency 7X/week   Progress towards PT Goals Progress towards PT goals: Progressing toward goals (slowly)  Plan Current plan remains appropriate    Precautions / Restrictions Precautions Precautions: Knee;Fall Required Braces or Orthoses: Knee Immobilizer - Right Knee Immobilizer - Right: Discontinue once straight leg raise with < 10 degree lag Restrictions Weight Bearing Restrictions: No RLE Weight Bearing: Weight bearing as tolerated   Pertinent Vitals/Pain R knee with activity-unrated. Ice applied end of session    Mobility  Bed Mobility Bed Mobility: Not assessed Transfers Transfers: Not assessed Ambulation/Gait Ambulation/Gait Assistance: Not tested (comment)    Exercises Total Joint Exercises Ankle Circles/Pumps: AROM;Both;10 reps;Supine Quad Sets: AROM;Both;10 reps;Supine Heel Slides: AAROM;Right;10 reps;Supine Hip ABduction/ADduction: AAROM;Right;10 reps;Supine Straight Leg Raises: AAROM;Right;10 reps;Supine   PT Diagnosis:    PT Problem List:   PT Treatment Interventions:     PT Goals (current goals can now be found in the care plan section)    Visit Information  Last PT Received On: 02/27/13 Assistance Needed: +2 History of  Present Illness: s/p R TKA 10/2. Hx of bipolar d/c, chronic fatigue syndrome, anxiety    Subjective Data      Cognition  Cognition Arousal/Alertness: Awake/alert Behavior During Therapy: WFL for tasks assessed/performed Overall Cognitive Status: History of cognitive impairments - at baseline Memory: Decreased short-term memory;Decreased recall of precautions    Balance     End of Session PT - End of Session Activity Tolerance: Patient tolerated treatment well Patient left: in bed;with call bell/phone within reach   GP     Rebeca Alert, MPT Pager: [phone]

## 2013-02-27 NOTE — Progress Notes (Signed)
MD notified of irreg heart rhythm and fever 102.2. Pt states she has hx of afib.  EKG obtained, SR with freq PVCs, tylenol given for fever.

## 2013-02-27 NOTE — Progress Notes (Signed)
Physical Therapy Treatment Patient Details Name: [name] MRN: [id number] DOB: [birth year] Today's Date: 02/27/2013 Time: 4098-1191 PT Time Calculation (min): 18 min  PT Assessment / Plan / Recommendation  History of Present Illness s/p R TKA 10/2. Hx of bipolar d/c, chronic fatigue syndrome, anxiety   PT Comments   Progressing slowly. Mobility slightly improved. Plan is for SNF on Monday. Limited mobility due to pt just started on 2nd unit of blood. Assisted to [facility] then back to bed.   Follow Up Recommendations  SNF     Does the patient have the potential to tolerate intense rehabilitation     Barriers to Discharge        Equipment Recommendations  None recommended by PT    Recommendations for Other Services OT consult  Frequency 7X/week   Progress towards PT Goals Progress towards PT goals: Progressing toward goals (slowly)  Plan Current plan remains appropriate    Precautions / Restrictions Precautions Precautions: Knee;Fall Required Braces or Orthoses: Knee Immobilizer - Right Knee Immobilizer - Right: Discontinue once straight leg raise with < 10 degree lag Restrictions Weight Bearing Restrictions: No RLE Weight Bearing: Weight bearing as tolerated   Pertinent Vitals/Pain R knee with activity-unrated. Ice applied end of session    Mobility  Bed Mobility Bed Mobility: Supine to Sit;Sit to Supine Supine to Sit: 2: Max assist Sit to Supine: 3: Mod assist Details for Bed Mobility Assistance: Assist for trunk and R LE. Increased time. Multimodal cues for safety, technique, hand placement. Utilized bed pad for scooting, positioning.  Transfers Transfers: Sit to Stand;Stand to Sit;Stand Pivot Transfers Sit to Stand: 1: +2 Total assist;From elevated surface;From chair/3-in-1 Sit to Stand: Patient Percentage: 60% Stand to Sit: 1: +2 Total assist;To bed;To elevated surface;To chair/3-in-1 Stand to Sit: Patient Percentage: 60% Details for Transfer Assistance: Stand pivot  x 2, bed<>bsc. Assist to rise, stabilize, control descent. Multimodal cues for safety, technique, hand placement Ambulation/Gait Ambulation/Gait Assistance: 1: +2 Total assist Ambulation/Gait: Patient Percentage: 70% Ambulation Distance (Feet): 3 Feet Ambulation/Gait Assistance Details: Assist to stabilize pt. VCs safety, sequence, technique. Limited distance due to pt just started on 2nd unit of blood.     Exercises     PT Diagnosis:    PT Problem List:   PT Treatment Interventions:     PT Goals (current goals can now be found in the care plan section)    Visit Information  Last PT Received On: 02/27/13 Assistance Needed: +2 History of Present Illness: s/p R TKA 10/2. Hx of bipolar d/c, chronic fatigue syndrome, anxiety    Subjective Data      Cognition       Balance     End of Session PT - End of Session Activity Tolerance: Patient tolerated treatment well Patient left: in bed;with call bell/phone within reach   GP     Rebeca Alert, MPT Pager: [phone]

## 2013-02-28 LAB — TYPE AND SCREEN
ABO/RH(D): A POS
Antibody Screen: NEGATIVE
Unit division: 0
Unit division: 0

## 2013-02-28 LAB — PROTIME-INR
INR: 1.38 (ref 0.00–1.49)
Prothrombin Time: 16.6 seconds — ABNORMAL HIGH (ref 11.6–15.2)

## 2013-02-28 MED ORDER — ENOXAPARIN SODIUM 30 MG/0.3ML ~~LOC~~ SOLN: 30.0000 mg | Freq: Two times a day (BID) | SUBCUTANEOUS | Status: DC

## 2013-02-28 MED ORDER — WARFARIN SODIUM 6 MG PO TABS
6.0000 mg | ORAL_TABLET | Freq: Once | ORAL | Status: AC
  Administered 2013-02-28: 09:00:00 6 mg via ORAL
  Filled 2013-02-28: qty 1

## 2013-02-28 NOTE — Progress Notes (Signed)
CSW assisting with d/c planning. [facility] is working with [employer] for SNF authorization. SNF will contact CSW when authorization is received. CSW will keep pt / treatment team updated.  Cori Razor LCSW [phone]

## 2013-02-28 NOTE — Progress Notes (Signed)
Pt discharged to [facility]. Report already called to facility.

## 2013-02-28 NOTE — Discharge Summary (Signed)
Physician Discharge Summary   Patient ID: [name] MRN: [id number] DOB/AGE: Aug 26, 1950 62 y.o.  Admit date: 02/24/2013 Discharge date:   Primary Diagnosis: right knee DJD Admission Diagnoses:  Past Medical History  Diagnosis Date  . Anemia   . Anxiety   . Depression   . Diverticulosis   . Elevated cholesterol   . Obesity   . THYROMEGALY 07/22/2010  . Overweight(278.02) 03/11/2010  . Mixed hyperlipidemia 03/11/2010  . MEASLES, HX OF 03/11/2010  . KNEE PAIN, RIGHT 05/28/2010  . HYPERTRIGLYCERIDEMIA 03/11/2010  . HEART MURMUR, HX OF 03/11/2010  . FIBROIDS, UTERUS 03/11/2010  . Diarrhea 08/05/2010  . CONSTIPATION 03/11/2010  . CHICKENPOX, HX OF 03/11/2010  . BIPOLAR AFFECTIVE DISORDER 03/11/2010  . AORTIC VALVE REPLACEMENT, HX OF 03/11/2010  . ANEMIA 05/28/2010  . Acute bronchitis 07/22/2010  . Abdominal pain, generalized 03/19/2010  . Peripheral edema 11/18/2010  . Bacterial vaginitis 11/18/2010  . Hematuria 12/18/2010  . Diverticulitis 12/18/2010  . Hyperthyroidism 12/18/2010  . Fatigue 12/30/2010  . Renal insufficiency 12/30/2010  . Grave's disease 8-12  . Arm lesion 03/23/2011  . UTI (lower urinary tract infection) 08/14/2011  . Breast pain in female 11/17/2011  . Chest pain 11/17/2011  . Hematoma 12/18/2011  . Cervical cancer screening 03/22/2012  . Foot pain, left 03/22/2012  . Foot fracture, left 03/22/2012  . Tick bite of back 10/15/2012  . Preop examination 02/03/2013  . Dysrhythmia   . Hypertension   . Shortness of breath     with exertion   . Heart murmur   . Urinary tract bacterial infections   . Arthritis    Discharge Diagnoses:   Principal Problem:   Right knee DJD  Estimated body mass index is 33.86 kg/(m^2) as calculated from the following:   Height as of this encounter: 5\' 8"  (1.727 m).   Weight as of this encounter: 101 kg (222 lb 10.6 oz).  Procedure:  Procedure(s) (LRB): RIGHT TOTAL KNEE ARTHROPLASTY (Right)   Consults: pharmacy for coumadin  dosing  HPI: see H&P Laboratory Data: Admission on 02/24/2013  Component Date Value Range Status  . Prothrombin Time 02/24/2013 14.1  11.6 - 15.2 seconds Final  . INR 02/24/2013 1.11  0.00 - 1.49 Final  . WBC 02/24/2013 13.1* 4.0 - 10.5 K/uL Final  . RBC 02/24/2013 3.42* 3.87 - 5.11 MIL/uL Final  . Hemoglobin 02/24/2013 10.4* 12.0 - 15.0 g/dL Final  . HCT 08/65/7846 31.1* 36.0 - 46.0 % Final  . MCV 02/24/2013 90.9  78.0 - 100.0 fL Final  . MCH 02/24/2013 30.4  26.0 - 34.0 pg Final  . MCHC 02/24/2013 33.4  30.0 - 36.0 g/dL Final  . RDW 96/29/5284 14.1  11.5 - 15.5 % Final  . Platelets 02/24/2013 238  150 - 400 K/uL Final  . Creatinine, Ser 02/24/2013 1.03  0.50 - 1.10 mg/dL Final  . GFR calc non Af Amer 02/24/2013 57* >90 mL/min Final  . GFR calc Af Amer 02/24/2013 66* >90 mL/min Final   Comment: (NOTE)                          The eGFR has been calculated using the CKD EPI equation.                          This calculation has not been validated in all clinical situations.  eGFR's persistently <90 mL/min signify possible Chronic Kidney                          Disease.  Marland Kitchen Prothrombin Time 02/25/2013 14.7  11.6 - 15.2 seconds Final  . INR 02/25/2013 1.17  0.00 - 1.49 Final  . WBC 02/25/2013 14.0* 4.0 - 10.5 K/uL Final  . RBC 02/25/2013 3.28* 3.87 - 5.11 MIL/uL Final  . Hemoglobin 02/25/2013 9.9* 12.0 - 15.0 g/dL Final  . HCT 56/21/3086 30.2* 36.0 - 46.0 % Final  . MCV 02/25/2013 92.1  78.0 - 100.0 fL Final  . MCH 02/25/2013 30.2  26.0 - 34.0 pg Final  . MCHC 02/25/2013 32.8  30.0 - 36.0 g/dL Final  . RDW 57/84/6962 14.2  11.5 - 15.5 % Final  . Platelets 02/25/2013 250  150 - 400 K/uL Final  . Sodium 02/25/2013 138  135 - 145 mEq/L Final  . Potassium 02/25/2013 4.2  3.5 - 5.1 mEq/L Final  . Chloride 02/25/2013 102  96 - 112 mEq/L Final  . CO2 02/25/2013 27  19 - 32 mEq/L Final  . Glucose, Bld 02/25/2013 162* 70 - 99 mg/dL Final  . BUN 95/28/4132 12  6 -  23 mg/dL Final  . Creatinine, Ser 02/25/2013 0.88  0.50 - 1.10 mg/dL Final  . Calcium 44/05/270 8.9  8.4 - 10.5 mg/dL Final  . GFR calc non Af Amer 02/25/2013 69* >90 mL/min Final  . GFR calc Af Amer 02/25/2013 80* >90 mL/min Final   Comment: (NOTE)                          The eGFR has been calculated using the CKD EPI equation.                          This calculation has not been validated in all clinical situations.                          eGFR's persistently <90 mL/min signify possible Chronic Kidney                          Disease.  Marland Kitchen Prothrombin Time 02/26/2013 17.5* 11.6 - 15.2 seconds Final  . INR 02/26/2013 1.48  0.00 - 1.49 Final  . WBC 02/26/2013 10.7* 4.0 - 10.5 K/uL Final  . RBC 02/26/2013 2.88* 3.87 - 5.11 MIL/uL Final  . Hemoglobin 02/26/2013 8.8* 12.0 - 15.0 g/dL Final  . HCT 53/66/4403 26.7* 36.0 - 46.0 % Final  . MCV 02/26/2013 92.7  78.0 - 100.0 fL Final  . MCH 02/26/2013 30.6  26.0 - 34.0 pg Final  . MCHC 02/26/2013 33.0  30.0 - 36.0 g/dL Final  . RDW 47/42/5956 14.4  11.5 - 15.5 % Final  . Platelets 02/26/2013 204  150 - 400 K/uL Final  . Prothrombin Time 02/27/2013 16.7* 11.6 - 15.2 seconds Final  . INR 02/27/2013 1.39  0.00 - 1.49 Final  . WBC 02/27/2013 8.9  4.0 - 10.5 K/uL Final  . RBC 02/27/2013 2.65* 3.87 - 5.11 MIL/uL Final  . Hemoglobin 02/27/2013 8.1* 12.0 - 15.0 g/dL Final  . HCT 38/75/6433 24.4* 36.0 - 46.0 % Final  . MCV 02/27/2013 92.1  78.0 - 100.0 fL Final  . MCH 02/27/2013 30.6  26.0 - 34.0 pg Final  . MCHC  02/27/2013 33.2  30.0 - 36.0 g/dL Final  . RDW 16/02/9603 14.6  11.5 - 15.5 % Final  . Platelets 02/27/2013 202  150 - 400 K/uL Final  . Order Confirmation 02/27/2013 ORDER PROCESSED BY BLOOD BANK   Final  . Prothrombin Time 02/28/2013 16.6* 11.6 - 15.2 seconds Final  . INR 02/28/2013 1.38  0.00 - 1.49 Final  . Hemoglobin 02/27/2013 10.0* 12.0 - 15.0 g/dL Final   Comment: POST TRANSFUSION SPECIMEN                          DELTA CHECK  NOTED  . HCT 02/27/2013 30.0* 36.0 - 46.0 % Final  Hospital Outpatient Visit on 02/17/2013  Component Date Value Range Status  . Sodium 02/17/2013 139  135 - 145 mEq/L Final  . Potassium 02/17/2013 4.2  3.5 - 5.1 mEq/L Final  . Chloride 02/17/2013 100  96 - 112 mEq/L Final  . CO2 02/17/2013 25  19 - 32 mEq/L Final  . Glucose, Bld 02/17/2013 93  70 - 99 mg/dL Final  . BUN 54/01/8118 21  6 - 23 mg/dL Final  . Creatinine, Ser 02/17/2013 1.19* 0.50 - 1.10 mg/dL Final  . Calcium 14/78/2956 9.6  8.4 - 10.5 mg/dL Final  . GFR calc non Af Amer 02/17/2013 48* >90 mL/min Final  . GFR calc Af Amer 02/17/2013 56* >90 mL/min Final   Comment: (NOTE)                          The eGFR has been calculated using the CKD EPI equation.                          This calculation has not been validated in all clinical situations.                          eGFR's persistently <90 mL/min signify possible Chronic Kidney                          Disease.  . WBC 02/17/2013 7.9  4.0 - 10.5 K/uL Final  . RBC 02/17/2013 3.85* 3.87 - 5.11 MIL/uL Final  . Hemoglobin 02/17/2013 11.6* 12.0 - 15.0 g/dL Final  . HCT 21/30/8657 34.8* 36.0 - 46.0 % Final  . MCV 02/17/2013 90.4  78.0 - 100.0 fL Final  . MCH 02/17/2013 30.1  26.0 - 34.0 pg Final  . MCHC 02/17/2013 33.3  30.0 - 36.0 g/dL Final  . RDW 84/69/6295 14.0  11.5 - 15.5 % Final  . Platelets 02/17/2013 273  150 - 400 K/uL Final  . Prothrombin Time 02/17/2013 25.1* 11.6 - 15.2 seconds Final  . INR 02/17/2013 2.37* 0.00 - 1.49 Final  . ABO/RH(D) 02/17/2013 A POS   Final  . Antibody Screen 02/17/2013 NEG   Final  . Sample Expiration 02/17/2013 02/27/2013   Final  . Unit Number 02/17/2013 [id number]   Final  . Blood Component Type 02/17/2013 RED CELLS,LR   Final  . Unit division 02/17/2013 00   Final  . Status of Unit 02/17/2013 ISSUED,FINAL   Final  . Transfusion Status 02/17/2013 OK TO TRANSFUSE   Final  . Crossmatch Result 02/17/2013 Compatible   Final  . Unit  Number 02/17/2013 [id number]   Final  . Blood Component Type 02/17/2013 RED CELLS,LR   Final  .  Unit division 02/17/2013 00   Final  . Status of Unit 02/17/2013 ISSUED,FINAL   Final  . Transfusion Status 02/17/2013 OK TO TRANSFUSE   Final  . Crossmatch Result 02/17/2013 Compatible   Final  . Color, Urine 02/17/2013 YELLOW  YELLOW Final  . APPearance 02/17/2013 CLEAR  CLEAR Final  . Specific Gravity, Urine 02/17/2013 1.011  1.005 - 1.030 Final  . pH 02/17/2013 6.5  5.0 - 8.0 Final  . Glucose, UA 02/17/2013 NEGATIVE  NEGATIVE mg/dL Final  . Hgb urine dipstick 02/17/2013 NEGATIVE  NEGATIVE Final  . Bilirubin Urine 02/17/2013 NEGATIVE  NEGATIVE Final  . Ketones, ur 02/17/2013 NEGATIVE  NEGATIVE mg/dL Final  . Protein, ur 16/02/9603 NEGATIVE  NEGATIVE mg/dL Final  . Urobilinogen, UA 02/17/2013 0.2  0.0 - 1.0 mg/dL Final  . Nitrite 54/01/8118 NEGATIVE  NEGATIVE Final  . Leukocytes, UA 02/17/2013 NEGATIVE  NEGATIVE Final   MICROSCOPIC NOT DONE ON URINES WITH NEGATIVE PROTEIN, BLOOD, LEUKOCYTES, NITRITE, OR GLUCOSE <1000 mg/dL.  Marland Kitchen MRSA, PCR 02/17/2013 NEGATIVE  NEGATIVE Final  . Staphylococcus aureus 02/17/2013 NEGATIVE  NEGATIVE Final   Comment:                                 The Xpert SA Assay (FDA                          approved for NASAL specimens                          in patients over 21 years of age),                          is one component of                          a comprehensive surveillance                          program.  Test performance has                          been validated by [employer] for patients greater                          than or equal to 24 year old.                          It is not intended                          to diagnose infection nor to                          guide or monitor treatment.  Marland Kitchen aPTT 02/17/2013 48* 24 - 37 seconds Final   Comment:  IF BASELINE aPTT IS ELEVATED,                           SUGGEST PATIENT RISK ASSESSMENT                          BE USED TO DETERMINE APPROPRIATE                          ANTICOAGULANT THERAPY.  . ABO/RH(D) 02/17/2013 A POS   Final  Office Visit on 02/10/2013  Component Date Value Range Status  . Culture 02/10/2013 STAPHYLOCOCCUS SPECIES (COAGULASE NEGATIVE)   Final  . Colony Count 02/10/2013 30,000 COLONIES/ML   Final  . Organism ID, Bacteria 02/10/2013 STAPHYLOCOCCUS SPECIES (COAGULASE NEGATIVE)   Final   Comment: Rifampin and Gentamicin should not be used as                          single drugs for treatment of Staph infections.  . Color, UA 02/10/2013 gold, dark   Final   Odorous  . Clarity, UA 02/10/2013 murky   Final  . Glucose, UA 02/10/2013 neg   Final  . Bilirubin, UA 02/10/2013 small   Final  . Ketones, UA 02/10/2013 neg   Final  . Spec Grav, UA 02/10/2013 >=1.030   Final  . Blood, UA 02/10/2013 large +++   Final  . pH, UA 02/10/2013 6.5   Final  . Protein, UA 02/10/2013 100 ++   Final  . Urobilinogen, UA 02/10/2013 0.2   Final  . Nitrite, UA 02/10/2013 neg   Final  . Leukocytes, UA 02/10/2013 moderate (2+)   Final  Office Visit on 02/03/2013  Component Date Value Range Status  . WBC 02/03/2013 5.5  4.0 - 10.5 K/uL Final  . RBC 02/03/2013 3.95  3.87 - 5.11 MIL/uL Final  . Hemoglobin 02/03/2013 11.8* 12.0 - 15.0 g/dL Final  . HCT 81/19/1478 35.2* 36.0 - 46.0 % Final  . MCV 02/03/2013 89.1  78.0 - 100.0 fL Final  . MCH 02/03/2013 29.9  26.0 - 34.0 pg Final  . MCHC 02/03/2013 33.5  30.0 - 36.0 g/dL Final  . RDW 29/56/2130 14.2  11.5 - 15.5 % Final  . Platelets 02/03/2013 281  150 - 400 K/uL Final  . Sodium 02/03/2013 138  135 - 145 mEq/L Final  . Potassium 02/03/2013 4.7  3.5 - 5.3 mEq/L Final  . Chloride 02/03/2013 103  96 - 112 mEq/L Final  . CO2 02/03/2013 28  19 - 32 mEq/L Final  . Glucose, Bld 02/03/2013 66* 70 - 99 mg/dL Final  . BUN 86/57/8469 21  6 - 23 mg/dL Final  . Creat 62/95/2841 1.43*  0.50 - 1.10 mg/dL Final  . Albumin 32/44/0102 4.6  3.5 - 5.2 g/dL Final  . Calcium 72/53/6644 9.8  8.4 - 10.5 mg/dL Final  . Phosphorus 03/47/4259 3.4  2.3 - 4.6 mg/dL Final  . Total Bilirubin 02/03/2013 0.5  0.3 - 1.2 mg/dL Final  . Bilirubin, Direct 02/03/2013 0.1  0.0 - 0.3 mg/dL Final  . Indirect Bilirubin 02/03/2013 0.4  0.0 - 0.9 mg/dL Final  . Alkaline Phosphatase 02/03/2013 34* 39 - 117 U/L Final  . AST 02/03/2013 26  0 - 37 U/L Final  . ALT 02/03/2013 21  0 - 35 U/L Final  . Total Protein 02/03/2013 7.1  6.0 -  8.3 g/dL Final  . Albumin 40/98/1191 4.6  3.5 - 5.2 g/dL Final  . TSH 47/82/9562 2.778  0.350 - 4.500 uIU/mL Final     X-Rays:Dg Knee 1-2 Views Right  02/24/2013   CLINICAL DATA:  Postop  EXAM: RIGHT KNEE - 1-2 VIEW  COMPARISON:  02/17/2013  FINDINGS: Right total knee arthroplasty has been performed. Satisfactory position and alignment of the femoral and tibial components. Surgical drain good position.  IMPRESSION: Satisfactory right TKA.   Electronically Signed   By: Davonna Belling M.D.   On: 02/24/2013 13:44   Dg Knee 1-2 Views Right  02/17/2013   CLINICAL DATA:  62 year old female preoperative study for knee arthroplasty.  EXAM: RIGHT KNEE - 1-2 VIEW  COMPARISON:  05/29/2010.  FINDINGS: Chronic medial compartment joint space loss and degenerative spurring. Moderate degenerative spurring at the lateral femoral condyles appears increased. Patella intact. No definite joint effusion. No fracture or dislocation.  IMPRESSION: Chronic medial compartment dominant degenerative changes at the right knee.   Electronically Signed   By: Augusto Gamble M.D.   On: 02/17/2013 14:47    EKG: Orders placed during the hospital encounter of 02/24/13  . ED EKG  . ED EKG     Hospital Course: [name] is a 62 y.o. who was admitted to [facility]. They were brought to the operating room on 02/24/2013 and underwent Procedure(s): RIGHT TOTAL KNEE ARTHROPLASTY.  Patient tolerated the  procedure well and was later transferred to the recovery room and then to the orthopaedic floor for postoperative care.  They were given PO and IV analgesics for pain control following their surgery.  They were given 24 hours of postoperative antibiotics of  Anti-infectives   Start     Dose/Rate Route Frequency Ordered Stop   02/24/13 1700  ceFAZolin (ANCEF) IVPB 2 g/50 mL premix     2 g 100 mL/hr over 30 Minutes Intravenous Every 6 hours 02/24/13 1507 02/25/13 0449   02/24/13 1121  polymyxin B 500,000 Units, bacitracin 50,000 Units in sodium chloride irrigation 0.9 % 500 mL irrigation  Status:  Discontinued       As needed 02/24/13 1121 02/24/13 1257   02/24/13 0845  ceFAZolin (ANCEF) IVPB 2 g/50 mL premix     2 g 100 mL/hr over 30 Minutes Intravenous On call to O.R. 02/24/13 1308 02/24/13 1053     and started on DVT prophylaxis in the form of Lovenox, Coumadin, TED hose and SCDs.   PT and OT were ordered for total joint protocol.  Discharge planning consulted to help with postop disposition and equipment needs.  Patient had a fair night on the evening of surgery.  They started to get up OOB with therapy on day one. Hemovac drain was pulled without difficulty.  Continued to work with therapy into day two. By day four, the patient had progressed with therapy and meeting their goals.  Incision was healing well.  Patient was seen in rounds and was ready for D/C to SNF.   Discharge Medications: Prior to Admission medications   Medication Sig Start Date End Date Taking? Authorizing Provider  clonazePAM (KLONOPIN) 0.5 MG tablet Take 0.5 mg by mouth 2 (two) times daily. 05/15/11  Yes Jeoffrey Massed, MD  fenofibrate 160 MG tablet Take 160 mg by mouth daily.    Yes Historical Provider, MD  furosemide (LASIX) 20 MG tablet Take 20 mg by mouth every morning.   Yes Historical Provider, MD  lamoTRIgine (LAMICTAL) 200 MG  tablet Take 200 mg by mouth at bedtime.   Yes Historical Provider, MD  metoprolol  succinate (TOPROL-XL) 25 MG 24 hr tablet Take 25 mg by mouth every morning.   Yes Historical Provider, MD  pantoprazole (PROTONIX) 40 MG tablet Take 40 mg by mouth daily.   Yes Historical Provider, MD  pravastatin (PRAVACHOL) 40 MG tablet Take 1 tablet (40 mg total) by mouth at bedtime. 05/15/11  Yes Jeoffrey Massed, MD  Probiotic Product (ALIGN) 4 MG CAPS Take 1 capsule by mouth daily. 11/18/10  Yes Bradd Canary, MD  Propylene Glycol (SYSTANE BALANCE) 0.6 % SOLN Place 1 drop into both eyes at bedtime.    Yes Historical Provider, MD  QUEtiapine (SEROQUEL) 200 MG tablet Take 400 mg by mouth at bedtime. 2- 200mg  tablets at bedtime   Yes Historical Provider, MD  venlafaxine XR (EFFEXOR-XR) 150 MG 24 hr capsule Take 150 mg by mouth every morning.    Yes Historical Provider, MD  B Complex-C (B-COMPLEX WITH VITAMIN C) tablet Take 1 tablet by mouth daily.    Historical Provider, MD  Cholecalciferol (VITAMIN D) 2000 UNITS tablet Take 2,000 Units by mouth daily.    Historical Provider, MD  Docusate Calcium (STOOL SOFTENER PO) Take 4 capsules by mouth at bedtime.     Historical Provider, MD  enoxaparin (LOVENOX) 30 MG/0.3ML injection Inject 0.3 mLs (30 mg total) into the skin every 12 (twelve) hours. 02/28/13   Dayna Barker. Christene Lye, PA-C  Krill Oil 300 MG CAPS Take 300 mg by mouth daily.    Historical Provider, MD  Multiple Vitamin (MULTIVITAMIN WITH MINERALS) TABS tablet Take 1 tablet by mouth daily.    Historical Provider, MD  oxyCODONE-acetaminophen (PERCOCET) 5-325 MG per tablet Take 1-2 tablets by mouth every 4 (four) hours as needed for pain. 02/24/13   Javier Docker, MD  oxyCODONE-acetaminophen (PERCOCET) 5-325 MG per tablet Take 1-2 tablets by mouth every 4 (four) hours as needed for pain. 02/24/13   Javier Docker, MD  vitamin C (ASCORBIC ACID) 500 MG tablet Take 500 mg by mouth daily.    Historical Provider, MD  warfarin (COUMADIN) 5 MG tablet Take 5 mg by mouth daily. 1/2 to 1 tab: alternating days  5mg  on Mondays  and Fridays 2.5mg  all other days    Historical Provider, MD    Diet: Regular diet Activity:WBAT Follow-up:in 10-14 days Disposition - Skilled nursing facility- [facility] Discharged Condition: good   Discharge Orders   Future Appointments Provider Department Dept Phone   04/01/2013 2:00 PM Bradd Canary, MD [facility] at  [facility] [phone]   04/05/2013 10:00 AM Lesleigh Noe, MD [facility] [phone]   Future Orders Complete By Expires   Call MD / Call 911  As directed    Comments:     If you experience chest pain or shortness of breath, CALL 911 and be transported to the hospital emergency room.  If you develope a fever above 101 F, pus (white drainage) or increased drainage or redness at the wound, or calf pain, call your surgeon's office.   Constipation Prevention  As directed    Comments:     Drink plenty of fluids.  Prune juice may be helpful.  You may use a stool softener, such as Colace (over the counter) 100 mg twice a day.  Use MiraLax (over the counter) for constipation as needed.   Diet - low sodium heart healthy  As directed  Increase activity slowly as tolerated  As directed        Medication List    STOP taking these medications       acetaminophen 325 MG tablet  Commonly known as:  TYLENOL     aspirin 81 MG tablet     HYDROcodone-acetaminophen 7.5-325 MG per tablet  Commonly known as:  NORCO      TAKE these medications       ALIGN 4 MG Caps  Take 1 capsule by mouth daily.     B-complex with vitamin C tablet  Take 1 tablet by mouth daily.     clonazePAM 0.5 MG tablet  Commonly known as:  KLONOPIN  Take 0.5 mg by mouth 2 (two) times daily.     enoxaparin 30 MG/0.3ML injection  Commonly known as:  LOVENOX  Inject 0.3 mLs (30 mg total) into the skin every 12 (twelve) hours.     fenofibrate 160 MG tablet  Take 160 mg by mouth daily.     furosemide 20 MG tablet  Commonly known as:  LASIX    Take 20 mg by mouth every morning.     Krill Oil 300 MG Caps  Take 300 mg by mouth daily.     lamoTRIgine 200 MG tablet  Commonly known as:  LAMICTAL  Take 200 mg by mouth at bedtime.     metoprolol succinate 25 MG 24 hr tablet  Commonly known as:  TOPROL-XL  Take 25 mg by mouth every morning.     multivitamin with minerals Tabs tablet  Take 1 tablet by mouth daily.     oxyCODONE-acetaminophen 5-325 MG per tablet  Commonly known as:  PERCOCET  Take 1-2 tablets by mouth every 4 (four) hours as needed for pain.     oxyCODONE-acetaminophen 5-325 MG per tablet  Commonly known as:  PERCOCET  Take 1-2 tablets by mouth every 4 (four) hours as needed for pain.     pantoprazole 40 MG tablet  Commonly known as:  PROTONIX  Take 40 mg by mouth daily.     pravastatin 40 MG tablet  Commonly known as:  PRAVACHOL  Take 1 tablet (40 mg total) by mouth at bedtime.     QUEtiapine 200 MG tablet  Commonly known as:  SEROQUEL  Take 400 mg by mouth at bedtime. 2- 200mg  tablets at bedtime     STOOL SOFTENER PO  Take 4 capsules by mouth at bedtime.     SYSTANE BALANCE 0.6 % Soln  Generic drug:  Propylene Glycol  Place 1 drop into both eyes at bedtime.     venlafaxine XR 150 MG 24 hr capsule  Commonly known as:  EFFEXOR-XR  Take 150 mg by mouth every morning.     vitamin C 500 MG tablet  Commonly known as:  ASCORBIC ACID  Take 500 mg by mouth daily.     Vitamin D 2000 UNITS tablet  Take 2,000 Units by mouth daily.     warfarin 5 MG tablet  Commonly known as:  COUMADIN  Take 5 mg by mouth daily. 1/2 to 1 tab: alternating days 5mg  on Mondays  and Fridays 2.5mg  all other days      CONTINUE LOVENOX 30MG  SUBQ BID UNTIL INR GREATER THAN OR EQUAL TO 2.0 MAY REQUIRE DOSE ADJUSTMENTS IN HER COUMADIN UNTIL THERAPEUTIC AS WELL     Follow-up Information   Follow up with BEANE,JEFFREY C, MD In 2 weeks.   Specialty:  Orthopedic Surgery   Contact  information:   [address] [place] [address] [phone]       Signed: Dorothy Spark. 02/28/2013, 12:44 PM

## 2013-02-28 NOTE — Progress Notes (Signed)
Clinical Social Work Department CLINICAL SOCIAL WORK PLACEMENT NOTE 02/28/2013  Patient:  [name], [name]  Account Number:  [id number] Admit date:  02/24/2013  Clinical Social Worker:  Cori Razor, LCSW  Date/time:  02/24/2013 04:38 PM  Clinical Social Work is seeking post-discharge placement for this patient at the following level of care:   SKILLED NURSING   (*CSW will update this form in Epic as items are completed)     Patient/family provided with [facility] Department of Clinical Social Work's list of facilities offering this level of care within the geographic area requested by the patient (or if unable, by the patient's family).  02/24/2013  Patient/family informed of their freedom to choose among providers that offer the needed level of care, that participate in Medicare, Medicaid or managed care program needed by the patient, have an available bed and are willing to accept the patient.    Patient/family informed of [facility]' ownership interest in [facility], as well as of the fact that they are under no obligation to receive care at this facility.  PASARR submitted to [employer] on 02/24/2013 PASARR number received from [employer] on 02/24/2013  FL2 transmitted to all facilities in geographic area requested by pt/family on  02/24/2013 FL2 transmitted to all facilities within larger geographic area on   Patient informed that his/her managed care company has contracts with or will negotiate with  certain facilities, including the following:     Patient/family informed of bed offers received:  02/24/2013 Patient chooses bed at [facility] Physician recommends and patient chooses bed at    Patient to be transferred to [facility] on  02/28/2013 Patient to be transferred to facility by [facility]  The following physician request were entered in Epic:   Additional Comments: [employer] provided prior authorization for SNF placement.  Cori Razor LCSW [phone]

## 2013-02-28 NOTE — Progress Notes (Signed)
ANTICOAGULATION CONSULT NOTE - follow-up  Pharmacy Consult for warfarin Indication: St. Jude AVR; s/p R TKA 02/25/13  Allergies  Allergen Reactions  . Sulfonamide Derivatives     headaches    Patient Measurements: Height: 5\' 8"  (172.7 cm) Weight: 222 lb 10.6 oz (101 kg) IBW/kg (Calculated) : 63.9  Vital Signs: Temp: 98.7 F (37.1 C) (10/06 0600) Temp src: Oral (10/05 2135) BP: 136/68 mmHg (10/06 0600) Pulse Rate: 76 (10/06 0600)  Labs:  Recent Labs  02/26/13 0426 02/27/13 0432 02/27/13 1843 02/28/13 0410  HGB 8.8* 8.1* 10.0*  --   HCT 26.7* 24.4* 30.0*  --   PLT 204 202  --   --   LABPROT 17.5* 16.7*  --  16.6*  INR 1.48 1.39  --  1.38    Estimated Creatinine Clearance: 82.4 ml/min (by C-G formula based on Cr of 0.88).   Medical History: Past Medical History  Diagnosis Date  . Anemia   . Anxiety   . Depression   . Diverticulosis   . Elevated cholesterol   . Obesity   . THYROMEGALY 07/22/2010  . Overweight(278.02) 03/11/2010  . Mixed hyperlipidemia 03/11/2010  . MEASLES, HX OF 03/11/2010  . KNEE PAIN, RIGHT 05/28/2010  . HYPERTRIGLYCERIDEMIA 03/11/2010  . HEART MURMUR, HX OF 03/11/2010  . FIBROIDS, UTERUS 03/11/2010  . Diarrhea 08/05/2010  . CONSTIPATION 03/11/2010  . CHICKENPOX, HX OF 03/11/2010  . BIPOLAR AFFECTIVE DISORDER 03/11/2010  . AORTIC VALVE REPLACEMENT, HX OF 03/11/2010  . ANEMIA 05/28/2010  . Acute bronchitis 07/22/2010  . Abdominal pain, generalized 03/19/2010  . Peripheral edema 11/18/2010  . Bacterial vaginitis 11/18/2010  . Hematuria 12/18/2010  . Diverticulitis 12/18/2010  . Hyperthyroidism 12/18/2010  . Fatigue 12/30/2010  . Renal insufficiency 12/30/2010  . Grave's disease 8-12  . Arm lesion 03/23/2011  . UTI (lower urinary tract infection) 08/14/2011  . Breast pain in female 11/17/2011  . Chest pain 11/17/2011  . Hematoma 12/18/2011  . Cervical cancer screening 03/22/2012  . Foot pain, left 03/22/2012  . Foot fracture, left 03/22/2012   . Tick bite of back 10/15/2012  . Preop examination 02/03/2013  . Dysrhythmia   . Hypertension   . Shortness of breath     with exertion   . Heart murmur   . Urinary tract bacterial infections   . Arthritis     Assessment: 62 YOF on chronic warfarin anticoagulation for mechanical prosthetic heart valve (St Jude's AVR), now s/p R TKA 02/24/13.  Prior to admission was on LMWH bridge (Lovenox 100 mg SQ q12h) while warfarin was on hold for surgery.  Patient's usual warfarin dosage is reported as 5 mg on Mondays and Fridays, 2.5mg  all other days  Warfarin dosages administered postop (10/2 - 10/5): 5mg , 5mg , 2.5mg , 5 mg.  Currently on prophylactic-dose Lovenox (30mg  SQ q12h); now > 72 hrs postop.  Goal of Therapy:  INR 2-3 Monitor platelets by anticoagulation protocol: Yes   Plan:   Boost with Coumadin 6 mg PO this AM.  Consider increasing Lovenox to full-dose IF benefit outweighs hemorrhagic risk - decision per ortho attending.  Daily INR.  Elie Goody, PharmD, BCPS Pager: [phone] 02/28/2013  8:03 AM

## 2013-02-28 NOTE — Progress Notes (Signed)
Subjective: 4 Days Post-Op Procedure(s) (LRB): RIGHT TOTAL KNEE ARTHROPLASTY (Right) Patient reports pain as mild.  Pain improving, well controlled. Denies CP, SOB, N/V, dizziness, lightheadedness. Awaiting D/C to SNF.  Objective: Vital signs in last 24 hours: Temp:  [97.3 F (36.3 C)-102.2 F (39 C)] 98.7 F (37.1 C) (10/06 0600) Pulse Rate:  [76-96] 76 (10/06 0600) Resp:  [14-20] 16 (10/06 0600) BP: (89-176)/(57-80) 136/68 mmHg (10/06 0600) SpO2:  [94 %-100 %] 97 % (10/06 0600)  Intake/Output from previous day: 10/05 0701 - 10/06 0700 In: 1752.9 [P.O.:480; I.V.:500; Blood:772.9] Out: 1325 [Urine:1325] Intake/Output this shift:     Recent Labs  02/26/13 0426 02/27/13 0432 02/27/13 1843  HGB 8.8* 8.1* 10.0*    Recent Labs  02/26/13 0426 02/27/13 0432 02/27/13 1843  WBC 10.7* 8.9  --   RBC 2.88* 2.65*  --   HCT 26.7* 24.4* 30.0*  PLT 204 202  --    No results found for this basename: NA, K, CL, CO2, BUN, CREATININE, GLUCOSE, CALCIUM,  in the last 72 hours  Recent Labs  02/27/13 0432 02/28/13 0410  INR 1.39 1.38    Neurologically intact ABD soft Neurovascular intact Sensation intact distally Intact pulses distally Dorsiflexion/Plantar flexion intact Incision: dressing C/D/I and no drainage No cellulitis present Compartment soft No calf pain or sign of DVT Swelling noted R knee  Assessment/Plan: 4 Days Post-Op Procedure(s) (LRB): RIGHT TOTAL KNEE ARTHROPLASTY (Right) Advance diet Up with therapy Discharge to SNF Ice and elevation to reduce swelling INR remains subtherapeutic at 1.38, coumadin per pharmacy, recommend increasing dosage of coumadin, lovenox upon D/C until therapeutic Discussed with Dr. Shelle Iron, he will discuss anticoagulation with pharmacy  Acute blood loss anemia post-operatively s/p transfusion 2 units PRBCs, Hgb from 8.1 yesterday to 10 today, stable  [name] M. 02/28/2013, 7:59 AM

## 2013-02-28 NOTE — Evaluation (Signed)
Occupational Therapy Evaluation Patient Details Name: SUZZETTE GASPARRO MRN: [id number] DOB: 1950-07-03 Today's Date: 02/28/2013 Time: 5284-1324 OT Time Calculation (min): 26 min  OT Assessment / Plan / Recommendation History of present illness s/p R TKA 10/2. Hx of bipolar d/c, chronic fatigue syndrome, anxiety   Clinical Impression   Pt presents to OT with decreased I with ADL activity s/p TKR. Pt will benefit from skilled OT to increase I with ADL activity and return to PLOF    OT Assessment  Patient needs continued OT Services    Follow Up Recommendations  SNF             Frequency  Min 2X/week    Precautions / Restrictions Precautions Precautions: Knee;Fall Required Braces or Orthoses: Knee Immobilizer - Right Knee Immobilizer - Right: Discontinue once straight leg raise with < 10 degree lag Restrictions Weight Bearing Restrictions: No RLE Weight Bearing: Weight bearing as tolerated       ADL  Eating/Feeding: Min guard Where Assessed - Grooming: Unsupported sitting Where Assessed - Upper Body Bathing: Unsupported sitting Lower Body Bathing: Set up Where Assessed - Lower Body Bathing: Supported sit to stand Upper Body Dressing: Minimal assistance Where Assessed - Upper Body Dressing: Unsupported sitting Lower Body Dressing: Maximal assistance Where Assessed - Lower Body Dressing: Supported sit to Pharmacist, hospital: Moderate assistance Toilet Transfer Method: Sit to stand Toilet Transfer Equipment: Bedside commode Toileting - Clothing Manipulation and Hygiene: Maximal assistance Where Assessed - Toileting Clothing Manipulation and Hygiene: Standing    OT Diagnosis: Generalized weakness;Acute pain  OT Problem List: Decreased strength;Decreased activity tolerance;Pain OT Treatment Interventions: Self-care/ADL training;Patient/family education;DME and/or AE instruction   OT Goals(Current goals can be found in the care plan section) Acute Rehab OT Goals Patient  Stated Goal: go to rehab SNF for rehab  OT Goal Formulation: With patient Time For Goal Achievement: 03/14/13  Visit Information  Last OT Received On: 02/28/13 Assistance Needed: +1 History of Present Illness: s/p R TKA 10/2. Hx of bipolar d/c, chronic fatigue syndrome, anxiety       Prior Functioning     Home Living Family/patient expects to be discharged to:: Skilled nursing facility Communication Communication: No difficulties            Cognition  Cognition Arousal/Alertness: Awake/alert Behavior During Therapy: WFL for tasks assessed/performed Overall Cognitive Status: History of cognitive impairments - at baseline    Extremity/Trunk Assessment Upper Extremity Assessment Upper Extremity Assessment: Generalized weakness     Mobility Bed Mobility Bed Mobility: Supine to Sit;Sit to Supine Supine to Sit: 3: Mod assist Sit to Supine: 3: Mod assist Details for Bed Mobility Assistance: Assist for trunk and R LE. Increased time. Multimodal cues for safety, technique, hand placement. Utilized bed pad for scooting, positioning.  Transfers Transfers: Sit to Stand;Stand to Sit Sit to Stand: From elevated surface;From chair/3-in-1;2: Max assist Stand to Sit: To chair/3-in-1;2: Max assist Stand to Sit: Patient Percentage: 60% Details for Transfer Assistance: Stand pivot x 2, bed<>bsc. Assist to rise, stabilize, control descent. Multimodal cues for safety, technique, hand placement           End of Session OT - End of Session Activity Tolerance: Patient tolerated treatment well Patient left: in chair;with call bell/phone within reach  GO     [facility], [place] Kung 02/28/2013, 9:52 AM

## 2013-02-28 NOTE — Progress Notes (Signed)
Physical Therapy Treatment Patient Details Name: [name] MRN: [id number] DOB: 04-01-1951 Today's Date: 02/28/2013 Time: 5284-1324 PT Time Calculation (min): 30 min  PT Assessment / Plan / Recommendation  History of Present Illness s/p R TKA 10/2. Hx of bipolar d/c, chronic fatigue syndrome, anxiety   PT Comments   POD # 4 pt OOB in recliner.  Assisted to amb limited distance then perform TE's.  Pt progressing slowly and plans to D/C to SNF for ST Rehab.    Follow Up Recommendations  SNF     Does the patient have the potential to tolerate intense rehabilitation     Barriers to Discharge        Equipment Recommendations  None recommended by PT    Recommendations for Other Services    Frequency 7X/week   Progress towards PT Goals Progress towards PT goals: Progressing toward goals  Plan      Precautions / Restrictions Precautions Precautions: Knee;Fall Precaution Comments: instructed pt on KI use for amb Required Braces or Orthoses: Knee Immobilizer - Right Knee Immobilizer - Right: Discontinue once straight leg raise with < 10 degree lag Restrictions Weight Bearing Restrictions: No RLE Weight Bearing: Weight bearing as tolerated    Pertinent Vitals/Pain C/o 8/10 pain ICE applied    Mobility  Bed Mobility Bed Mobility: Not assessed Supine to Sit: 3: Mod assist Sit to Supine: 3: Mod assist Details for Bed Mobility Assistance: Pt OOB in recliner Transfers Transfers: Sit to Stand;Stand to Sit Sit to Stand: 4: Min assist;3: Mod assist;From chair/3-in-1 Stand to Sit: 4: Min assist;3: Mod assist;To chair/3-in-1 Stand to Sit: Patient Percentage: 60% Details for Transfer Assistance: 75% VC's on proper tech and hand placement.  VC's also needed for proper R LE placement esp with stand to sit. Ambulation/Gait Ambulation/Gait Assistance: 4: Min assist;3: Mod assist Ambulation Distance (Feet): 18 Feet Assistive device: Rolling walker Ambulation/Gait Assistance Details:  75% VC's on proper sequencing and proper walker to self distance.  Limited amb distance due to max c/o fatigue and 8/10 pain. Gait Pattern: Step-to pattern;Decreased stride length;Decreased step length - left;Decreased step length - right;Trunk flexed;Narrow base of support;Decreased weight shift to right Gait velocity: decreased    Exercises   Total Knee Replacement TE's 10 reps B LE ankle pumps 10 reps knee presses 10 reps heel slides  10 reps SAQ's 10 reps SLR's 10 reps ABD Followed by ICE   PT Goals (current goals can now be found in the care plan section) Acute Rehab PT Goals Patient Stated Goal: go to rehab SNF for rehab   Visit Information  Last PT Received On: 02/28/13 Assistance Needed: +1 History of Present Illness: s/p R TKA 10/2. Hx of bipolar d/c, chronic fatigue syndrome, anxiety    Subjective Data  Patient Stated Goal: go to rehab SNF for rehab    Cognition  Cognition Arousal/Alertness: Awake/alert Behavior During Therapy: [facility] for tasks assessed/performed Overall Cognitive Status: History of cognitive impairments - at baseline    Balance     End of Session PT - End of Session Equipment Utilized During Treatment: Gait belt Activity Tolerance: Patient tolerated treatment well Patient left: in chair;with call bell/phone within reach   Felecia Shelling  PTA [facility]  Acute  Rehab Pager      [phone]

## 2013-02-28 NOTE — Progress Notes (Signed)
ANTICOAGULATION CONSULT NOTE - follow-up  Pharmacy Consult for warfarin Indication: St. Jude AVR; s/p R TKA 02/25/13  Allergies  Allergen Reactions  . Sulfonamide Derivatives     headaches    Patient Measurements: Height: 5\' 8"  (172.7 cm) Weight: 222 lb 10.6 oz (101 kg) IBW/kg (Calculated) : 63.9  Vital Signs: Temp: 98.7 F (37.1 C) (10/06 0600) Temp src: Oral (10/05 2135) BP: 136/68 mmHg (10/06 0600) Pulse Rate: 76 (10/06 0600)  Labs:  Recent Labs  02/26/13 0426 02/27/13 0432 02/27/13 1843 02/28/13 0410  HGB 8.8* 8.1* 10.0*  --   HCT 26.7* 24.4* 30.0*  --   PLT 204 202  --   --   LABPROT 17.5* 16.7*  --  16.6*  INR 1.48 1.39  --  1.38    Estimated Creatinine Clearance: 82.4 ml/min (by C-G formula based on Cr of 0.88).   Medical History: Past Medical History  Diagnosis Date  . Anemia   . Anxiety   . Depression   . Diverticulosis   . Elevated cholesterol   . Obesity   . THYROMEGALY 07/22/2010  . Overweight(278.02) 03/11/2010  . Mixed hyperlipidemia 03/11/2010  . MEASLES, HX OF 03/11/2010  . KNEE PAIN, RIGHT 05/28/2010  . HYPERTRIGLYCERIDEMIA 03/11/2010  . HEART MURMUR, HX OF 03/11/2010  . FIBROIDS, UTERUS 03/11/2010  . Diarrhea 08/05/2010  . CONSTIPATION 03/11/2010  . CHICKENPOX, HX OF 03/11/2010  . BIPOLAR AFFECTIVE DISORDER 03/11/2010  . AORTIC VALVE REPLACEMENT, HX OF 03/11/2010  . ANEMIA 05/28/2010  . Acute bronchitis 07/22/2010  . Abdominal pain, generalized 03/19/2010  . Peripheral edema 11/18/2010  . Bacterial vaginitis 11/18/2010  . Hematuria 12/18/2010  . Diverticulitis 12/18/2010  . Hyperthyroidism 12/18/2010  . Fatigue 12/30/2010  . Renal insufficiency 12/30/2010  . Grave's disease 8-12  . Arm lesion 03/23/2011  . UTI (lower urinary tract infection) 08/14/2011  . Breast pain in female 11/17/2011  . Chest pain 11/17/2011  . Hematoma 12/18/2011  . Cervical cancer screening 03/22/2012  . Foot pain, left 03/22/2012  . Foot fracture, left 03/22/2012   . Tick bite of back 10/15/2012  . Preop examination 02/03/2013  . Dysrhythmia   . Hypertension   . Shortness of breath     with exertion   . Heart murmur   . Urinary tract bacterial infections   . Arthritis     Assessment: 62 YOF on chronic warfarin anticoagulation for mechanical prosthetic heart valve (St Jude's AVR), now s/p R TKA 02/24/13.  Prior to admission was on LMWH bridge (Lovenox 100 mg SQ q12h) while warfarin was on hold for surgery.  Patient's usual warfarin dosage is reported as 5 mg on Mondays and Fridays, 2.5mg  all other days  Warfarin dosages administered postop (10/2 - 10/5): 5mg , 5mg , 2.5mg , 5 mg.  Currently on prophylactic-dose Lovenox (30mg  SQ q12h); now > 72 hrs postop.  Goal of Therapy:  INR 2-3 Monitor platelets by anticoagulation protocol: Yes   Plan:   Boost with Coumadin 6 mg PO this AM.  Spoke with Dr. Shelle Iron - risk of hemorrhage is felt to preclude use of full-dose Lovenox, so continuing prophylactic-dose instead.  Agree this is a very reasonable approach.   Recommend: 1.  Please note that today's warfarin is being given at 10am ( NO warfarin should be administered tonight at [facility]). 2.  Recheck INR daily at SNF - may need some additional booster doses of warfarin before returning to her previous maintenance dosage of 2.5 mg daily except 5 mg Mondays and Fridays.  3.  Continue Lovenox until INR 2.0 or higher.  Elie Goody, PharmD, BCPS Pager: [phone] 02/28/2013  9:28 AM

## 2013-02-28 NOTE — Progress Notes (Signed)
Report called to Liz at [facility]. 

## 2013-03-01 ENCOUNTER — Non-Acute Institutional Stay (SKILLED_NURSING_FACILITY): Payer: BC Managed Care – PPO | Admitting: Adult Health

## 2013-03-01 DIAGNOSIS — K5900 Constipation, unspecified: Secondary | ICD-10-CM

## 2013-03-01 DIAGNOSIS — F411 Generalized anxiety disorder: Secondary | ICD-10-CM

## 2013-03-01 DIAGNOSIS — E785 Hyperlipidemia, unspecified: Secondary | ICD-10-CM

## 2013-03-01 DIAGNOSIS — Z954 Presence of other heart-valve replacement: Secondary | ICD-10-CM

## 2013-03-01 DIAGNOSIS — F32A Depression, unspecified: Secondary | ICD-10-CM | POA: Insufficient documentation

## 2013-03-01 DIAGNOSIS — F419 Anxiety disorder, unspecified: Secondary | ICD-10-CM | POA: Insufficient documentation

## 2013-03-01 DIAGNOSIS — M1710 Unilateral primary osteoarthritis, unspecified knee: Secondary | ICD-10-CM

## 2013-03-01 DIAGNOSIS — IMO0002 Reserved for concepts with insufficient information to code with codable children: Secondary | ICD-10-CM

## 2013-03-01 DIAGNOSIS — I10 Essential (primary) hypertension: Secondary | ICD-10-CM

## 2013-03-01 DIAGNOSIS — F329 Major depressive disorder, single episode, unspecified: Secondary | ICD-10-CM

## 2013-03-01 DIAGNOSIS — M1711 Unilateral primary osteoarthritis, right knee: Secondary | ICD-10-CM

## 2013-03-01 DIAGNOSIS — F319 Bipolar disorder, unspecified: Secondary | ICD-10-CM

## 2013-03-01 NOTE — Progress Notes (Signed)
Patient ID: [name], female   DOB: 09-Jun-1950, 62 y.o.   MRN: [id number]       PROGRESS NOTE  DATE: 03/01/2013  FACILITY: Nursing Home Location: [facility]  LEVEL OF CARE: SNF (31)  Acute Visit  CHIEF COMPLAINT:  Follow-up hospitalization  HISTORY OF PRESENT ILLNESS: This is a 62 year old female who has been admitted to [facility] on 02/28/13 from [facility] with DJD S/P right total knee arthroplasty. She has been admitted for a short-term rehabilitation.  REASSESSMENT OF ONGOING PROBLEM(S):  DEPRESSION: The depression remains stable. Patient denies ongoing feelings of sadness, insomnia, anedhonia or lack of appetite. No complications reported from the medications currently being used. Staff do not report behavioral problems.  BIPOLAR DISORDER: The bipolar disorder remains stable.  Patient denies depressive symptoms or manic episodes.  No complications reported from the medications currently being used.  HTN: Pt 's HTN remains stable.  Denies CP, sob, DOE, pedal edema, headaches, dizziness or visual disturbances.  No complications from the medications currently being used.  Last BP : 151/74   PAST MEDICAL HISTORY : Reviewed.  No changes.  CURRENT MEDICATIONS: Reviewed per [facility]  REVIEW OF SYSTEMS:  GENERAL: no change in appetite, no fatigue, no weight changes, no fever, chills or weakness RESPIRATORY: no cough, SOB, DOE, wheezing, hemoptysis CARDIAC: no chest pain, edema or palpitations GI: no abdominal pain, diarrhea, constipation, heart burn, nausea or vomiting  PHYSICAL EXAMINATION  VS:  T 98.3       P 80      RR 20      BP 151/74       GENERAL: no acute distress, normal body habitus EYES: conjunctivae normal, sclerae normal, normal eye lids NECK: supple, trachea midline, no neck masses, no thyroid tenderness, no thyromegaly LYMPHATICS: no LAN in the neck, no supraclavicular LAN RESPIRATORY: breathing is even & unlabored, BS CTAB CARDIAC:  RRR, no murmur,no extra heart sounds, no edema GI: abdomen soft, normal BS, no masses, no tenderness, no hepatomegaly, no splenomegaly PSYCHIATRIC: the patient is alert & oriented to person, affect & behavior appropriate  LABS/RADIOLOGY: 02/25/13 sodium 138 potassium 4.2 glucose 162 BUN 12 creatinine 0.88 calcium 8.9 02/24/13 WBC 15.1 hemoglobin 10.4 hematocrit 31.1   ASSESSMENT/PLAN:  DJD status post right total knee arthroplasty - for rehabilitation  Hypertension - will check BP/HR Q shift x 1 week  Hyperlipidemia - continue fenofibrate and Pravachol  Depression - stable  Anxiety - stable  Bipolar disorder - stable  Constipation - no complaints  History of aortic valve replacement - continue Coumadin   CPT CODE: 40981

## 2013-03-04 ENCOUNTER — Non-Acute Institutional Stay: Payer: BC Managed Care – PPO | Admitting: Adult Health

## 2013-03-04 DIAGNOSIS — Z7901 Long term (current) use of anticoagulants: Secondary | ICD-10-CM

## 2013-03-04 DIAGNOSIS — Z954 Presence of other heart-valve replacement: Secondary | ICD-10-CM

## 2013-03-10 ENCOUNTER — Non-Acute Institutional Stay (SKILLED_NURSING_FACILITY): Payer: BC Managed Care – PPO | Admitting: Adult Health

## 2013-03-10 DIAGNOSIS — K5900 Constipation, unspecified: Secondary | ICD-10-CM

## 2013-03-10 DIAGNOSIS — M1711 Unilateral primary osteoarthritis, right knee: Secondary | ICD-10-CM

## 2013-03-10 DIAGNOSIS — Z954 Presence of other heart-valve replacement: Secondary | ICD-10-CM

## 2013-03-10 DIAGNOSIS — M1710 Unilateral primary osteoarthritis, unspecified knee: Secondary | ICD-10-CM

## 2013-03-10 DIAGNOSIS — F419 Anxiety disorder, unspecified: Secondary | ICD-10-CM

## 2013-03-10 DIAGNOSIS — F411 Generalized anxiety disorder: Secondary | ICD-10-CM

## 2013-03-10 DIAGNOSIS — IMO0002 Reserved for concepts with insufficient information to code with codable children: Secondary | ICD-10-CM

## 2013-03-10 DIAGNOSIS — I10 Essential (primary) hypertension: Secondary | ICD-10-CM

## 2013-03-10 DIAGNOSIS — E785 Hyperlipidemia, unspecified: Secondary | ICD-10-CM

## 2013-03-10 DIAGNOSIS — F32A Depression, unspecified: Secondary | ICD-10-CM

## 2013-03-10 DIAGNOSIS — F329 Major depressive disorder, single episode, unspecified: Secondary | ICD-10-CM

## 2013-03-10 DIAGNOSIS — F319 Bipolar disorder, unspecified: Secondary | ICD-10-CM

## 2013-03-11 ENCOUNTER — Encounter: Payer: Self-pay | Admitting: Adult Health

## 2013-03-11 DIAGNOSIS — Z7901 Long term (current) use of anticoagulants: Secondary | ICD-10-CM | POA: Insufficient documentation

## 2013-03-11 NOTE — Progress Notes (Signed)
Patient ID: [name], female   DOB: [birth year], 62 y.o.   MRN: [id number]       PROGRESS NOTE  DATE: 03/10/2013   FACILITY: [facility]  LEVEL OF CARE: SNF (31)  Acute Visit  CHIEF COMPLAINT:  Discharge Notes  HISTORY OF PRESENT ILLNESS: This is a 62 year old female who is for discharge home with Home health PT, OT and Nursing. DME: 3-in-1 Bedside Com[employer]. She has been admitted to [facility] on 02/28/13 from [facility] with DJD S/P right total knee arthroplasty.  Patient was admitted to this facility for short-term rehabilitation after the patient's recent hospitalization.  Patient has completed SNF rehabilitation and therapy has cleared the patient for discharge.  Reassessment of ongoing problem(s):  HTN: Pt 's HTN remains stable.  Denies CP, sob, DOE, pedal edema, headaches, dizziness or visual disturbances.  No complications from the medications currently being used.  Last BP : 144/72  CONSTIPATION: The constipation remains stable. No complications from the medications presently being used. Patient denies ongoing constipation, abdominal pain, nausea or vomiting.  HYPERLIPIDEMIA: No complications from the medications presently being used.  8/14 cholesterol 172  Triglyceride 277  HDL 58  LDL 59   PAST MEDICAL HISTORY : Reviewed.  No changes.  CURRENT MEDICATIONS: Reviewed per [facility]  REVIEW OF SYSTEMS:  GENERAL: no change in appetite, no fatigue, no weight changes, no fever, chills or weakness RESPIRATORY: no cough, SOB, DOE, wheezing, hemoptysis CARDIAC: no chest pain, edema or palpitations GI: no abdominal pain, diarrhea, constipation, heart burn, nausea or vomiting  PHYSICAL EXAMINATION  VS:  T99.1       P66       RR18      BP144/72      POX95 %       WT218 (Lb)  GENERAL: no acute distress, normal body habitus EYES: conjunctivae normal, sclerae normal, normal eye lids NECK: supple, trachea midline, no neck masses, no thyroid tenderness, no  thyromegaly RESPIRATORY: breathing is even & unlabored, BS CTAB CARDIAC: RRR, no murmur,no extra heart sounds, no edema GI: abdomen soft, normal BS, no masses, no tenderness, no hepatomegaly, no splenomegaly PSYCHIATRIC: the patient is alert & oriented to person, affect & behavior appropriate  LABS/RADIOLOGY: 03/01/13 WBC 7.5 hemoglobin 9.8 hematocrit 31.7 sodium 140 potassium 3.7 glucose 112 BUN 19 creatinine 0.8 calcium 8.9 02/25/13 sodium 138 potassium 4.2 glucose 162 BUN 12 creatinine 0.88 calcium 8.9 02/24/13 WBC 15.1 hemoglobin 10.4 hematocrit 31.1  ASSESSMENT/PLAN:  DJD status post right total knee arthroplasty - for Home health PT, OT and Nursing  Hypertension - continue Toprol XL  Hyperlipidemia - continue fenofibrate and Pravachol  Depression - stable; continue Seroquel and Effexor  Anxiety - stable; continue Klonopin  Bipolar disorder - stable; continue Lamictal  Constipation - no complaints  History of aortic valve replacement - continue Coumadin     I have filled out patient's discharge paperwork and written prescriptions.  Patient will receive home health PT, OT and Nursing.  DME provided: 3-in-1 bedside commode  Total discharge time: Greater than 30 minutes Discharge time involved coordination of the discharge process with social worker, nursing staff and therapy department. Medical justification for home health services/DME verified.  CPT CODE: 40981

## 2013-03-11 NOTE — Progress Notes (Signed)
Patient ID: CHARLAYNE VULTAGGIO, female   DOB: 08/01/1950, 62 y.o.   MRN: [id number] Subjective:     Indication: Aortic Valve Replacement Bleeding signs/symptoms: None Thromboembolic signs/symptoms: None  Missed Coumadin doses: None Medication changes: no Dietary changes: no Bacterial/viral infection: no Other concerns: no     Review of Systems A comprehensive review of systems was negative.   Objective:    INR Today: 3.2 Current dose: Coumadin 5 mg PO Q M-T-W-Th_Sat and Sun; Coumadin 2.5 mg PO Q Friday   Assessment:    Supratherapeutic INR for goal of 2-3   Plan:    1. New dose: Decrease Coumadin 2.5 mg 1 tab PO Q Friday and Saturdays; Coumadin 5 mg 1 tab PO Q M-T-W-Th and Sun  ; discontinue Lovenox 2. Next INR: 03/08/13

## 2013-03-15 LAB — POCT INR: INR: 2.4

## 2013-03-18 ENCOUNTER — Ambulatory Visit (INDEPENDENT_AMBULATORY_CARE_PROVIDER_SITE_OTHER): Payer: BC Managed Care – PPO | Admitting: Pharmacist

## 2013-03-18 DIAGNOSIS — I4891 Unspecified atrial fibrillation: Secondary | ICD-10-CM | POA: Insufficient documentation

## 2013-03-18 DIAGNOSIS — I359 Nonrheumatic aortic valve disorder, unspecified: Secondary | ICD-10-CM

## 2013-03-18 DIAGNOSIS — Z954 Presence of other heart-valve replacement: Secondary | ICD-10-CM

## 2013-03-25 ENCOUNTER — Ambulatory Visit (INDEPENDENT_AMBULATORY_CARE_PROVIDER_SITE_OTHER): Payer: BC Managed Care – PPO | Admitting: Cardiology

## 2013-03-25 DIAGNOSIS — Z954 Presence of other heart-valve replacement: Secondary | ICD-10-CM

## 2013-03-25 DIAGNOSIS — I4891 Unspecified atrial fibrillation: Secondary | ICD-10-CM

## 2013-03-25 DIAGNOSIS — I359 Nonrheumatic aortic valve disorder, unspecified: Secondary | ICD-10-CM

## 2013-03-25 LAB — POCT INR: INR: 3.2

## 2013-04-01 ENCOUNTER — Ambulatory Visit: Payer: BC Managed Care – PPO | Admitting: Family Medicine

## 2013-04-04 ENCOUNTER — Encounter: Payer: Self-pay | Admitting: Family Medicine

## 2013-04-04 ENCOUNTER — Ambulatory Visit (INDEPENDENT_AMBULATORY_CARE_PROVIDER_SITE_OTHER): Payer: BC Managed Care – PPO | Admitting: Family Medicine

## 2013-04-04 VITALS — BP 128/72 | HR 62 | Temp 98.4°F | Resp 16 | Ht 68.0 in | Wt 222.5 lb

## 2013-04-04 DIAGNOSIS — I10 Essential (primary) hypertension: Secondary | ICD-10-CM

## 2013-04-04 DIAGNOSIS — N390 Urinary tract infection, site not specified: Secondary | ICD-10-CM

## 2013-04-04 DIAGNOSIS — N289 Disorder of kidney and ureter, unspecified: Secondary | ICD-10-CM

## 2013-04-04 DIAGNOSIS — E785 Hyperlipidemia, unspecified: Secondary | ICD-10-CM

## 2013-04-04 DIAGNOSIS — I4891 Unspecified atrial fibrillation: Secondary | ICD-10-CM

## 2013-04-04 LAB — RENAL FUNCTION PANEL
Albumin: 4.6 g/dL (ref 3.5–5.2)
BUN: 22 mg/dL (ref 6–23)
CO2: 28 mEq/L (ref 19–32)
Calcium: 9.5 mg/dL (ref 8.4–10.5)
Chloride: 103 mEq/L (ref 96–112)
Creat: 1.34 mg/dL — ABNORMAL HIGH (ref 0.50–1.10)
Glucose, Bld: 87 mg/dL (ref 70–99)
Phosphorus: 3.4 mg/dL (ref 2.3–4.6)
Potassium: 4 mEq/L (ref 3.5–5.3)
Sodium: 139 mEq/L (ref 135–145)

## 2013-04-04 LAB — CBC
HCT: 32.6 % — ABNORMAL LOW (ref 36.0–46.0)
Hemoglobin: 10.9 g/dL — ABNORMAL LOW (ref 12.0–15.0)
MCH: 30.3 pg (ref 26.0–34.0)
MCHC: 33.4 g/dL (ref 30.0–36.0)
MCV: 90.6 fL (ref 78.0–100.0)
Platelets: 317 10*3/uL (ref 150–400)
RBC: 3.6 MIL/uL — ABNORMAL LOW (ref 3.87–5.11)
RDW: 15.5 % (ref 11.5–15.5)
WBC: 8.2 10*3/uL (ref 4.0–10.5)

## 2013-04-04 LAB — LIPID PANEL
Cholesterol: 177 mg/dL (ref 0–200)
HDL: 58 mg/dL (ref >39)
LDL Cholesterol: 71 mg/dL (ref 0–99)
Total CHOL/HDL Ratio: 3.1 Ratio
Triglycerides: 241 mg/dL — ABNORMAL HIGH (ref <150)
VLDL: 48 mg/dL — ABNORMAL HIGH (ref 0–40)

## 2013-04-04 LAB — HEPATIC FUNCTION PANEL
ALT: 13 U/L (ref 0–35)
AST: 21 U/L (ref 0–37)
Albumin: 4.6 g/dL (ref 3.5–5.2)
Alkaline Phosphatase: 40 U/L (ref 39–117)
Bilirubin, Direct: 0.1 mg/dL (ref 0.0–0.3)
Indirect Bilirubin: 0.4 mg/dL (ref 0.0–0.9)
Total Bilirubin: 0.5 mg/dL (ref 0.3–1.2)
Total Protein: 7.7 g/dL (ref 6.0–8.3)

## 2013-04-04 MED ORDER — WARFARIN SODIUM 5 MG PO TABS: ORAL_TABLET | ORAL | Status: DC

## 2013-04-04 NOTE — Progress Notes (Signed)
Patient ID: [name], female   DOB: Dec 08, 1950, 62 y.o.   MRN: [id number] [name] [id number] 1951/02/14 04/04/2013      Progress Note-Follow Up  Subjective  Chief Complaint  Chief Complaint  Patient presents with  . Follow-up    3-mth. [Hyperlipid, Thyroid disease, Pernicious Anemia, Renal Insufficiency]    HPI  Patient is a 62 yo caucasian female in today for follow up. She has recently undergone a R TKR recently and is doing well. She still has some decreased ROM but her pain is much better. No fevers, malaise, myalgias, cp, palp, sob, gi or gu c/o.  Past Medical History  Diagnosis Date  . Anemia   . Anxiety   . Depression   . Diverticulosis   . Elevated cholesterol   . Obesity   . THYROMEGALY 07/22/2010  . Overweight(278.02) 03/11/2010  . Mixed hyperlipidemia 03/11/2010  . MEASLES, HX OF 03/11/2010  . KNEE PAIN, RIGHT 05/28/2010  . HYPERTRIGLYCERIDEMIA 03/11/2010  . HEART MURMUR, HX OF 03/11/2010  . FIBROIDS, UTERUS 03/11/2010  . Diarrhea 08/05/2010  . CONSTIPATION 03/11/2010  . CHICKENPOX, HX OF 03/11/2010  . BIPOLAR AFFECTIVE DISORDER 03/11/2010  . AORTIC VALVE REPLACEMENT, HX OF 03/11/2010  . ANEMIA 05/28/2010  . Acute bronchitis 07/22/2010  . Abdominal pain, generalized 03/19/2010  . Peripheral edema 11/18/2010  . Bacterial vaginitis 11/18/2010  . Hematuria 12/18/2010  . Diverticulitis 12/18/2010  . Hyperthyroidism 12/18/2010  . Fatigue 12/30/2010  . Renal insufficiency 12/30/2010  . Grave's disease 8-12  . Arm lesion 03/23/2011  . UTI (lower urinary tract infection) 08/14/2011  . Breast pain in female 11/17/2011  . Chest pain 11/17/2011  . Hematoma 12/18/2011  . Cervical cancer screening 03/22/2012  . Foot pain, left 03/22/2012  . Foot fracture, left 03/22/2012  . Tick bite of back 10/15/2012  . Preop examination 02/03/2013  . Dysrhythmia   . Hypertension   . Shortness of breath     with exertion   . Heart murmur   . Urinary tract bacterial infections   .  Arthritis     Past Surgical History  Procedure Laterality Date  . Appendectomy      Required revision for EColi infection, required recurrent packing  . Bowel obstruction      Requiring adhesions to be removed  . Aortic valve replacement    . Open heart with a cardiac aneurysm repair    . Cholecystectomy    . Tubal ligation    . Varicose vein surgery b/l legs    . Childhood exploratory surgery of heart    . Hernia repair  08-16-10  . Abdominal hysterectomy      sb/l spo, total  . Total knee arthroplasty Right 02/24/2013    Procedure: RIGHT TOTAL KNEE ARTHROPLASTY;  Surgeon: Javier Docker, MD;  Location: [facility];  Service: Orthopedics;  Laterality: Right;    Family History  Problem Relation Age of Onset  . Scoliosis Mother   . Arthritis Mother     Rheumatoid  . Aneurysm Mother     heart  . Alcohol abuse Father   . Depression Sister   . Depression Brother   . Alcohol abuse Brother   . Cancer Maternal Grandmother     colon  . Diabetes Maternal Grandfather   . Heart disease Maternal Grandfather   . Alcohol abuse Maternal Grandfather   . Depression Paternal Grandmother   . Diabetes Paternal Grandmother   . Depression Paternal Grandfather   . Diabetes Paternal  Grandfather   . Depression Sister   . Alcohol abuse Brother   . Depression Brother   . Heart disease Brother     History   Social History  . Marital Status: Married    Spouse Name: N/A    Number of Children: N/A  . Years of Education: N/A   Occupational History  . Not on file.   Social History Main Topics  . Smoking status: Former Smoker    Quit date: 05/26/1990  . Smokeless tobacco: Never Used  . Alcohol Use: 0.0 oz/week    0 drink(s) per week     Comment: rare  . Drug Use: No  . Sexual Activity: No   Other Topics Concern  . Not on file   Social History Narrative  . No narrative on file    Current Outpatient Prescriptions on File Prior to Visit  Medication Sig Dispense Refill  . B  Complex-C (B-COMPLEX WITH VITAMIN C) tablet Take 1 tablet by mouth daily.      . Cholecalciferol (VITAMIN D) 2000 UNITS tablet Take 2,000 Units by mouth daily.      . clonazePAM (KLONOPIN) 0.5 MG tablet Take 0.5 mg by mouth 2 (two) times daily.      Tery Sanfilippo Calcium (STOOL SOFTENER PO) Take 4 capsules by mouth at bedtime.       . enoxaparin (LOVENOX) 30 MG/0.3ML injection Inject 0.3 mLs (30 mg total) into the skin every 12 (twelve) hours.  14 Syringe  1  . fenofibrate 160 MG tablet Take 160 mg by mouth daily.       . furosemide (LASIX) 20 MG tablet Take 20 mg by mouth every morning.      Boris Lown Oil 300 MG CAPS Take 300 mg by mouth daily.      Marland Kitchen lamoTRIgine (LAMICTAL) 200 MG tablet Take 200 mg by mouth at bedtime.      . metoprolol succinate (TOPROL-XL) 25 MG 24 hr tablet Take 25 mg by mouth every morning.      . Multiple Vitamin (MULTIVITAMIN WITH MINERALS) TABS tablet Take 1 tablet by mouth daily.      Marland Kitchen oxyCODONE-acetaminophen (PERCOCET) 5-325 MG per tablet Take 1-2 tablets by mouth every 4 (four) hours as needed for pain.  60 tablet  0  . oxyCODONE-acetaminophen (PERCOCET) 5-325 MG per tablet Take 1-2 tablets by mouth every 4 (four) hours as needed for pain.  50 tablet  0  . pantoprazole (PROTONIX) 40 MG tablet Take 40 mg by mouth daily.      . pravastatin (PRAVACHOL) 40 MG tablet Take 1 tablet (40 mg total) by mouth at bedtime.  30 tablet  1  . Probiotic Product (ALIGN) 4 MG CAPS Take 1 capsule by mouth daily.  30 capsule  0  . Propylene Glycol (SYSTANE BALANCE) 0.6 % SOLN Place 1 drop into both eyes at bedtime.       Marland Kitchen QUEtiapine (SEROQUEL) 200 MG tablet Take 400 mg by mouth at bedtime. 2- 200mg  tablets at bedtime      . venlafaxine XR (EFFEXOR-XR) 150 MG 24 hr capsule Take 150 mg by mouth every morning.       . vitamin C (ASCORBIC ACID) 500 MG tablet Take 500 mg by mouth daily.      Marland Kitchen warfarin (COUMADIN) 5 MG tablet Take 5 mg by mouth daily. 1/2 to 1 tab: alternating days 5mg  on Mondays   and Fridays 2.5mg  all other days      . [DISCONTINUED]  methimazole (TAPAZOLE) 5 MG tablet Take 1 tablet (5 mg total) by mouth 3 (three) times daily.  30 tablet  2   No current facility-administered medications on file prior to visit.    Allergies  Allergen Reactions  . Sulfonamide Derivatives     headaches    Review of Systems  Review of Systems  Constitutional: Negative for fever and malaise/fatigue.  HENT: Negative for congestion.   Eyes: Negative for discharge.  Respiratory: Negative for shortness of breath.   Cardiovascular: Negative for chest pain, palpitations and leg swelling.  Gastrointestinal: Negative for nausea, abdominal pain and diarrhea.  Genitourinary: Negative for dysuria.  Musculoskeletal: Negative for falls.  Skin: Negative for rash.  Neurological: Negative for loss of consciousness and headaches.  Endo/Heme/Allergies: Negative for polydipsia.  Psychiatric/Behavioral: Negative for depression and suicidal ideas. The patient is not nervous/anxious and does not have insomnia.     Objective  BP 128/72  Pulse 62  Temp(Src) 98.4 F (36.9 C) (Oral)  Resp 16  Ht 5\' 8"  (1.727 m)  Wt 222 lb 8 oz (100.925 kg)  BMI 33.84 kg/m2  SpO2 97%  Physical Exam  Physical Exam  Constitutional: She is oriented to person, place, and time and well-developed, well-nourished, and in no distress. No distress.  HENT:  Head: Normocephalic and atraumatic.  Eyes: Conjunctivae are normal.  Neck: Neck supple. No thyromegaly present.  Cardiovascular: Normal rate, regular rhythm and normal heart sounds.   No murmur heard. Pulmonary/Chest: Effort normal and breath sounds normal. She has no wheezes.  Abdominal: She exhibits no distension and no mass.  Musculoskeletal: She exhibits no edema.  Lymphadenopathy:    She has no cervical adenopathy.  Neurological: She is alert and oriented to person, place, and time.  Skin: Skin is warm and dry. No rash noted. She is not diaphoretic.   Psychiatric: Memory, affect and judgment normal.    Lab Results  Component Value Date   TSH 2.778 02/03/2013   Lab Results  Component Value Date   WBC 8.9 02/27/2013   HGB 10.0* 02/27/2013   HCT 30.0* 02/27/2013   MCV 92.1 02/27/2013   PLT 202 02/27/2013   Lab Results  Component Value Date   CREATININE 0.88 02/25/2013   BUN 12 02/25/2013   NA 138 02/25/2013   K 4.2 02/25/2013   CL 102 02/25/2013   CO2 27 02/25/2013   Lab Results  Component Value Date   ALT 21 02/03/2013   AST 26 02/03/2013   ALKPHOS 34* 02/03/2013   BILITOT 0.5 02/03/2013   Lab Results  Component Value Date   CHOL 172 12/30/2012   Lab Results  Component Value Date   HDL 58 12/30/2012   Lab Results  Component Value Date   LDLCALC 59 12/30/2012   Lab Results  Component Value Date   TRIG 277* 12/30/2012   Lab Results  Component Value Date   CHOLHDL 3.0 12/30/2012     Assessment & Plan  Essential hypertension, benign Well controlled, no changes today  Atrial fibrillation Rate controlled tolerating Coumadin  Recurrent UTI Asymptomatic at this time  Renal insufficiency Mild, stable. Maintain adequate hydration

## 2013-04-04 NOTE — Patient Instructions (Signed)

## 2013-04-05 ENCOUNTER — Telehealth: Payer: Self-pay

## 2013-04-05 ENCOUNTER — Encounter: Payer: Self-pay | Admitting: Interventional Cardiology

## 2013-04-05 ENCOUNTER — Ambulatory Visit (INDEPENDENT_AMBULATORY_CARE_PROVIDER_SITE_OTHER): Payer: BC Managed Care – PPO | Admitting: Interventional Cardiology

## 2013-04-05 ENCOUNTER — Ambulatory Visit (INDEPENDENT_AMBULATORY_CARE_PROVIDER_SITE_OTHER): Payer: BC Managed Care – PPO | Admitting: Pharmacist

## 2013-04-05 VITALS — BP 115/62 | HR 62 | Ht 68.0 in | Wt 222.0 lb

## 2013-04-05 DIAGNOSIS — I4891 Unspecified atrial fibrillation: Secondary | ICD-10-CM

## 2013-04-05 DIAGNOSIS — I10 Essential (primary) hypertension: Secondary | ICD-10-CM

## 2013-04-05 DIAGNOSIS — I359 Nonrheumatic aortic valve disorder, unspecified: Secondary | ICD-10-CM

## 2013-04-05 DIAGNOSIS — Z954 Presence of other heart-valve replacement: Secondary | ICD-10-CM

## 2013-04-05 DIAGNOSIS — Z952 Presence of prosthetic heart valve: Secondary | ICD-10-CM

## 2013-04-05 DIAGNOSIS — I5032 Chronic diastolic (congestive) heart failure: Secondary | ICD-10-CM

## 2013-04-05 DIAGNOSIS — E782 Mixed hyperlipidemia: Secondary | ICD-10-CM

## 2013-04-05 LAB — T4, FREE: Free T4: 1.05 ng/dL (ref 0.80–1.80)

## 2013-04-05 LAB — POCT INR: INR: 5

## 2013-04-05 LAB — TSH: TSH: 2.904 u[IU]/mL (ref 0.350–4.500)

## 2013-04-05 NOTE — Progress Notes (Signed)
Patient ID: [name], female   DOB: December 03, 1950, 62 y.o.   MRN: [id number]    [address], [address] [place], [place]  [address] Phone: [phone] Fax:  [phone]  Date:  04/05/2013   ID:  [name], DOB Dec 14, 1950, MRN [id number]  PCP:  Danise Edge, MD   ASSESSMENT:  1. Mechanical aortic valve, stable without complications. 2. Chronic Coumadin anticoagulation therapy 3. History of atrial fibrillation, paroxysmal. None recent. 4. Chronic diastolic heart failure, stable  PLAN:  1. Continue to followup in the Coumadin clinic 2. Notify if dyspnea, edema, or arrhythmia/palpitations 3. Otherwise clinical followup in one year.   SUBJECTIVE: [name] is a 62 y.o. female who is doing well and denies cardiovascular complaints. She is recently status post right knee replacement. She has not noticed blood in her urine or stool. No recent falls or head trauma. She denies palpitations and chest pain. There is no orthopnea. No medication side effects. She continues to follow up in the Coumadin clinic. She is receiving rehabilitation for her right knee.   Wt Readings from Last 3 Encounters:  04/05/13 222 lb (100.699 kg)  04/04/13 222 lb 8 oz (100.925 kg)  03/04/13 224 lb 6.4 oz (101.787 kg)     Past Medical History  Diagnosis Date  . Anemia   . Anxiety   . Depression   . Diverticulosis   . Elevated cholesterol   . Obesity   . THYROMEGALY 07/22/2010  . Overweight(278.02) 03/11/2010  . Mixed hyperlipidemia 03/11/2010  . MEASLES, HX OF 03/11/2010  . KNEE PAIN, RIGHT 05/28/2010  . HYPERTRIGLYCERIDEMIA 03/11/2010  . HEART MURMUR, HX OF 03/11/2010  . FIBROIDS, UTERUS 03/11/2010  . Diarrhea 08/05/2010  . CONSTIPATION 03/11/2010  . CHICKENPOX, HX OF 03/11/2010  . BIPOLAR AFFECTIVE DISORDER 03/11/2010  . AORTIC VALVE REPLACEMENT, HX OF 03/11/2010  . ANEMIA 05/28/2010  . Acute bronchitis 07/22/2010  . Abdominal pain, generalized 03/19/2010  . Peripheral edema 11/18/2010    . Bacterial vaginitis 11/18/2010  . Hematuria 12/18/2010  . Diverticulitis 12/18/2010  . Hyperthyroidism 12/18/2010  . Fatigue 12/30/2010  . Renal insufficiency 12/30/2010  . Grave's disease 8-12  . Arm lesion 03/23/2011  . UTI (lower urinary tract infection) 08/14/2011  . Breast pain in female 11/17/2011  . Chest pain 11/17/2011  . Hematoma 12/18/2011  . Cervical cancer screening 03/22/2012  . Foot pain, left 03/22/2012  . Foot fracture, left 03/22/2012  . Tick bite of back 10/15/2012  . Preop examination 02/03/2013  . Dysrhythmia   . Hypertension   . Shortness of breath     with exertion   . Heart murmur   . Urinary tract bacterial infections   . Arthritis     Current Outpatient Prescriptions  Medication Sig Dispense Refill  . B Complex-C (B-COMPLEX WITH VITAMIN C) tablet Take 1 tablet by mouth daily.      . Cholecalciferol (VITAMIN D) 2000 UNITS tablet Take 2,000 Units by mouth daily.      . clonazePAM (KLONOPIN) 0.5 MG tablet Take 0.5 mg by mouth 2 (two) times daily.      Tery Sanfilippo Calcium (STOOL SOFTENER PO) Take 4 capsules by mouth at bedtime.       . fenofibrate 160 MG tablet Take 160 mg by mouth daily.       . furosemide (LASIX) 20 MG tablet Take 20 mg by mouth every morning.      Boris Lown Oil 300 MG CAPS Take 300 mg  by mouth daily.      Marland Kitchen lamoTRIgine (LAMICTAL) 200 MG tablet Take 200 mg by mouth at bedtime.      . metoprolol succinate (TOPROL-XL) 25 MG 24 hr tablet Take 25 mg by mouth every morning.      . Multiple Vitamin (MULTIVITAMIN WITH MINERALS) TABS tablet Take 1 tablet by mouth daily.      Marland Kitchen oxyCODONE-acetaminophen (PERCOCET) 5-325 MG per tablet Take 1-2 tablets by mouth every 4 (four) hours as needed for pain.  60 tablet  0  . pantoprazole (PROTONIX) 40 MG tablet Take 40 mg by mouth daily.      . pravastatin (PRAVACHOL) 40 MG tablet Take 1 tablet (40 mg total) by mouth at bedtime.  30 tablet  1  . Probiotic Product (ALIGN) 4 MG CAPS Take 1 capsule by mouth daily.  30  capsule  0  . Propylene Glycol (SYSTANE BALANCE) 0.6 % SOLN Place 1 drop into both eyes at bedtime.       Marland Kitchen QUEtiapine (SEROQUEL) 200 MG tablet Take 400 mg by mouth at bedtime. 2- 200mg  tablets at bedtime      . venlafaxine XR (EFFEXOR-XR) 150 MG 24 hr capsule Take 150 mg by mouth every morning.       . vitamin C (ASCORBIC ACID) 500 MG tablet Take 500 mg by mouth daily.      Marland Kitchen warfarin (COUMADIN) 5 MG tablet 1 tab po daily exc Fri is 1/2 tab      . [DISCONTINUED] methimazole (TAPAZOLE) 5 MG tablet Take 1 tablet (5 mg total) by mouth 3 (three) times daily.  30 tablet  2   No current facility-administered medications for this visit.    Allergies:    Allergies  Allergen Reactions  . Sulfonamide Derivatives     headaches    Social History:  The patient  reports that she quit smoking about 22 years ago. She has never used smokeless tobacco. She reports that she drinks alcohol. She reports that she does not use illicit drugs.   ROS:  Please see the history of present illness.   Good appetite, stable weight, denies melena, hematuria, hematemesis, hemoptysis, and transient neurological symptoms.   All other systems reviewed and negative.   OBJECTIVE: VS:  BP 115/62  Pulse 62  Ht 5\' 8"  (1.727 m)  Wt 222 lb (100.699 kg)  BMI 33.76 kg/m2 Well nourished, well developed, in no acute distress, mildly obese HEENT: normal Neck: JVD absent. Carotid bruit 2+ and symmetric  Cardiac:  normal S1, S2; RRR; mechanical valve closure S 2 compatible with aortic position. Lungs:  clear to auscultation bilaterally, no wheezing, rhonchi or rales Abd: soft, nontender, no hepatomegaly Ext: Edema absent. Pulses 2+ Skin: warm and dry Neuro:  CNs 2-12 intact, no focal abnormalities noted  EKG:  Not performed       Signed, Darci Needle III, MD 04/05/2013 10:27 AM

## 2013-04-05 NOTE — Patient Instructions (Signed)
Your physician recommends that you continue on your current medications as directed. Please refer to the Current Medication list given to you today.  Your physician wants you to follow-up in: 1 year. You will receive a reminder letter in the mail two months in advance. If you don't receive a letter, please call our office to schedule the follow-up appointment.  

## 2013-04-05 NOTE — Telephone Encounter (Signed)
Message copied by Eulis Manly on Tue Apr 05, 2013  9:36 AM ------      Message from: Danise Edge A      Created: Tue Apr 05, 2013  9:31 AM       Notify anemia improving after surgery, will check with next visit, a MV with iron would be good or we can call in some iron Hemocyte F 1 tab po daily #30 with 3 rf. The only other concern was her creatinine was up slightly make sure she hydrates well then in about a month she should recheck a cbc and renal ------

## 2013-04-05 NOTE — Telephone Encounter (Signed)
Patient informed of results. Patient stated that she would try the multivitamin with iron over the counter first to see how it works and then depending on how things go she might try a prescription for iron.

## 2013-04-06 NOTE — Assessment & Plan Note (Signed)
Rate controlled tolerating Coumadin 

## 2013-04-06 NOTE — Assessment & Plan Note (Signed)
Mild, stable. Maintain adequate hydration

## 2013-04-06 NOTE — Assessment & Plan Note (Signed)
Well controlled, no changes today 

## 2013-04-06 NOTE — Assessment & Plan Note (Signed)
Asymptomatic at this time 

## 2013-04-08 ENCOUNTER — Other Ambulatory Visit: Payer: Self-pay | Admitting: *Deleted

## 2013-04-08 MED ORDER — PRAVASTATIN SODIUM 40 MG PO TABS: 40.0000 mg | ORAL_TABLET | Freq: Every day | ORAL | Status: DC

## 2013-04-12 ENCOUNTER — Ambulatory Visit (INDEPENDENT_AMBULATORY_CARE_PROVIDER_SITE_OTHER): Payer: BC Managed Care – PPO | Admitting: Pharmacist

## 2013-04-12 DIAGNOSIS — Z954 Presence of other heart-valve replacement: Secondary | ICD-10-CM

## 2013-04-12 DIAGNOSIS — I4891 Unspecified atrial fibrillation: Secondary | ICD-10-CM

## 2013-04-12 DIAGNOSIS — I359 Nonrheumatic aortic valve disorder, unspecified: Secondary | ICD-10-CM

## 2013-04-12 LAB — POCT INR: INR: 2.7

## 2013-04-28 ENCOUNTER — Ambulatory Visit (INDEPENDENT_AMBULATORY_CARE_PROVIDER_SITE_OTHER): Payer: BC Managed Care – PPO | Admitting: Pharmacist

## 2013-04-28 DIAGNOSIS — I4891 Unspecified atrial fibrillation: Secondary | ICD-10-CM

## 2013-04-28 DIAGNOSIS — Z954 Presence of other heart-valve replacement: Secondary | ICD-10-CM

## 2013-04-28 DIAGNOSIS — I359 Nonrheumatic aortic valve disorder, unspecified: Secondary | ICD-10-CM

## 2013-04-28 LAB — POCT INR: INR: 2.4

## 2013-05-12 ENCOUNTER — Ambulatory Visit (INDEPENDENT_AMBULATORY_CARE_PROVIDER_SITE_OTHER): Payer: BC Managed Care – PPO | Admitting: Pharmacist

## 2013-05-12 DIAGNOSIS — Z954 Presence of other heart-valve replacement: Secondary | ICD-10-CM

## 2013-05-12 DIAGNOSIS — I359 Nonrheumatic aortic valve disorder, unspecified: Secondary | ICD-10-CM

## 2013-05-12 DIAGNOSIS — I4891 Unspecified atrial fibrillation: Secondary | ICD-10-CM

## 2013-05-12 LAB — POCT INR: INR: 2.6

## 2013-06-28 ENCOUNTER — Ambulatory Visit (INDEPENDENT_AMBULATORY_CARE_PROVIDER_SITE_OTHER): Payer: BC Managed Care – PPO | Admitting: *Deleted

## 2013-06-28 DIAGNOSIS — Z954 Presence of other heart-valve replacement: Secondary | ICD-10-CM

## 2013-06-28 DIAGNOSIS — I4891 Unspecified atrial fibrillation: Secondary | ICD-10-CM

## 2013-06-28 DIAGNOSIS — I359 Nonrheumatic aortic valve disorder, unspecified: Secondary | ICD-10-CM

## 2013-06-28 LAB — POCT INR: INR: 2.4

## 2013-07-26 ENCOUNTER — Ambulatory Visit (INDEPENDENT_AMBULATORY_CARE_PROVIDER_SITE_OTHER): Payer: BC Managed Care – PPO | Admitting: *Deleted

## 2013-07-26 DIAGNOSIS — Z954 Presence of other heart-valve replacement: Secondary | ICD-10-CM

## 2013-07-26 DIAGNOSIS — I4891 Unspecified atrial fibrillation: Secondary | ICD-10-CM

## 2013-07-26 DIAGNOSIS — I359 Nonrheumatic aortic valve disorder, unspecified: Secondary | ICD-10-CM

## 2013-07-26 LAB — POCT INR: INR: 2.7

## 2013-08-08 ENCOUNTER — Ambulatory Visit: Payer: BC Managed Care – PPO | Admitting: Family Medicine

## 2013-08-19 ENCOUNTER — Ambulatory Visit: Payer: BC Managed Care – PPO | Admitting: Family Medicine

## 2013-08-23 ENCOUNTER — Ambulatory Visit (INDEPENDENT_AMBULATORY_CARE_PROVIDER_SITE_OTHER): Payer: BC Managed Care – PPO | Admitting: Pharmacist

## 2013-08-23 DIAGNOSIS — I4891 Unspecified atrial fibrillation: Secondary | ICD-10-CM

## 2013-08-23 DIAGNOSIS — I359 Nonrheumatic aortic valve disorder, unspecified: Secondary | ICD-10-CM

## 2013-08-23 DIAGNOSIS — Z954 Presence of other heart-valve replacement: Secondary | ICD-10-CM

## 2013-08-23 LAB — POCT INR: INR: 2.6

## 2013-09-13 ENCOUNTER — Ambulatory Visit (INDEPENDENT_AMBULATORY_CARE_PROVIDER_SITE_OTHER): Payer: BC Managed Care – PPO | Admitting: *Deleted

## 2013-09-13 DIAGNOSIS — Z5181 Encounter for therapeutic drug level monitoring: Secondary | ICD-10-CM

## 2013-09-13 DIAGNOSIS — I4891 Unspecified atrial fibrillation: Secondary | ICD-10-CM

## 2013-09-13 DIAGNOSIS — Z954 Presence of other heart-valve replacement: Secondary | ICD-10-CM

## 2013-09-13 DIAGNOSIS — I359 Nonrheumatic aortic valve disorder, unspecified: Secondary | ICD-10-CM

## 2013-09-13 LAB — POCT INR: INR: 2.4

## 2013-09-26 ENCOUNTER — Telehealth: Payer: Self-pay | Admitting: Family Medicine

## 2013-09-26 DIAGNOSIS — I10 Essential (primary) hypertension: Secondary | ICD-10-CM

## 2013-09-26 MED ORDER — WARFARIN SODIUM 5 MG PO TABS: ORAL_TABLET | ORAL | Status: DC

## 2013-09-26 NOTE — Addendum Note (Signed)
Addended by: Varney Daily on: 09/26/2013 01:07 PM   Modules accepted: Orders

## 2013-09-26 NOTE — Telephone Encounter (Signed)
Yes please refill her Coumadin for the next 6 months, no change in sig

## 2013-09-26 NOTE — Telephone Encounter (Signed)
Do we fill the warfarin?

## 2013-09-26 NOTE — Telephone Encounter (Signed)
Refill warfarin  

## 2013-09-26 NOTE — Addendum Note (Signed)
Addended by: Varney Daily on: 09/26/2013 09:09 AM   Modules accepted: Orders

## 2013-09-26 NOTE — Telephone Encounter (Signed)
Closed by mistake

## 2013-09-27 ENCOUNTER — Ambulatory Visit (INDEPENDENT_AMBULATORY_CARE_PROVIDER_SITE_OTHER): Payer: BC Managed Care – PPO

## 2013-09-27 DIAGNOSIS — Z954 Presence of other heart-valve replacement: Secondary | ICD-10-CM

## 2013-09-27 DIAGNOSIS — I4891 Unspecified atrial fibrillation: Secondary | ICD-10-CM

## 2013-09-27 DIAGNOSIS — Z5181 Encounter for therapeutic drug level monitoring: Secondary | ICD-10-CM

## 2013-09-27 LAB — POCT INR: INR: 2.2

## 2013-09-30 ENCOUNTER — Other Ambulatory Visit: Payer: Self-pay

## 2013-09-30 MED ORDER — FENOFIBRATE 160 MG PO TABS: 160.0000 mg | ORAL_TABLET | Freq: Every day | ORAL | Status: DC

## 2013-10-06 ENCOUNTER — Telehealth: Payer: Self-pay | Admitting: *Deleted

## 2013-10-06 DIAGNOSIS — I10 Essential (primary) hypertension: Secondary | ICD-10-CM

## 2013-10-06 MED ORDER — WARFARIN SODIUM 5 MG PO TABS: ORAL_TABLET | ORAL | Status: DC

## 2013-10-06 NOTE — Telephone Encounter (Signed)
Called for refill

## 2013-10-11 ENCOUNTER — Ambulatory Visit (INDEPENDENT_AMBULATORY_CARE_PROVIDER_SITE_OTHER): Payer: BC Managed Care – PPO

## 2013-10-11 DIAGNOSIS — Z5181 Encounter for therapeutic drug level monitoring: Secondary | ICD-10-CM

## 2013-10-11 DIAGNOSIS — I359 Nonrheumatic aortic valve disorder, unspecified: Secondary | ICD-10-CM

## 2013-10-11 DIAGNOSIS — I4891 Unspecified atrial fibrillation: Secondary | ICD-10-CM

## 2013-10-11 DIAGNOSIS — Z954 Presence of other heart-valve replacement: Secondary | ICD-10-CM

## 2013-10-11 LAB — POCT INR: INR: 2.4

## 2013-10-14 ENCOUNTER — Ambulatory Visit (INDEPENDENT_AMBULATORY_CARE_PROVIDER_SITE_OTHER): Payer: BC Managed Care – PPO | Admitting: Physician Assistant

## 2013-10-14 ENCOUNTER — Encounter: Payer: Self-pay | Admitting: Physician Assistant

## 2013-10-14 VITALS — BP 134/72 | HR 59 | Temp 97.9°F | Resp 16 | Ht 68.0 in | Wt 248.5 lb

## 2013-10-14 DIAGNOSIS — J209 Acute bronchitis, unspecified: Secondary | ICD-10-CM

## 2013-10-14 MED ORDER — HYDROCOD POLST-CHLORPHEN POLST 10-8 MG/5ML PO LQCR: 5.0000 mL | Freq: Two times a day (BID) | ORAL | Status: DC | PRN

## 2013-10-14 MED ORDER — AMOXICILLIN 875 MG PO TABS: 875.0000 mg | ORAL_TABLET | Freq: Two times a day (BID) | ORAL | Status: DC

## 2013-10-14 MED ORDER — ALBUTEROL SULFATE HFA 108 (90 BASE) MCG/ACT IN AERS: 2.0000 | INHALATION_SPRAY | Freq: Four times a day (QID) | RESPIRATORY_TRACT | Status: DC | PRN

## 2013-10-14 NOTE — Patient Instructions (Signed)
Take antibiotic as directed with food.  Increase fluid intake.  Rest.  Use Tussionex for cough.  Albuterol inhaler as directed for wheezing. Place a humidifier in the bedroom.  Call or return to clinic if symptoms are not improving.  Metered Dose Inhaler (No Spacer Used) Inhaled medicines are the basis of asthma treatment and other breathing problems. Inhaled medicine can only be effective if used properly. Good technique assures that the medicine reaches the lungs. Metered dose inhalers (MDIs) are used to deliver a variety of inhaled medicines. These include quick relief or rescue medicines (such as bronchodilators) and controller medicines (such as corticosteroids). The medicine is delivered by pushing down on a metal canister to release a set amount of spray. If you are using different kinds of inhalers, use your quick relief medicine to open the airways 10 15 minutes before using a steroid if instructed to do so by your health care provider. If you are unsure which inhalers to use and the order of using them, ask your health care provider, nurse, or respiratory therapist. HOW TO USE THE INHALER 1. Remove cap from inhaler. 2. If you are using the inhaler for the first time, you will need to prime it. Shake the inhaler for 5 seconds and release four puffs into the air, away from your face. Ask your health care provider or pharmacist if you have questions about priming your inhaler. 3. Shake inhaler for 5 seconds before each breath in (inhalation). 4. Position the inhaler so that the top of the canister faces up. 5. Put your index finger on the top of the medicine canister. Your thumb supports the bottom of the inhaler. 6. Open your mouth. 7. Either place the inhaler between your teeth and place your lips tightly around the mouthpiece, or hold the inhaler 1 2 inches away from your open mouth. If you are unsure of which technique to use, ask your health care provider. 8. Breathe out (exhale) normally  and as completely as possible. 9. Press the canister down with the index finger to release the medicine. 10. At the same time as the canister is pressed, inhale deeply and slowly until the lungs are completely filled. This should take 4 6 seconds. Keep your tongue down. 11. Hold the medicine in your lungs for up to 5 10 seconds (10 seconds is best). This helps the medicine get into the small airways of your lungs. 12. Breathe out slowly, through pursed lips. Whistling is an example of pursed lips. 13. Wait at least 1 minute between puffs. Continue with the above steps until you have taken the number of puffs your health care provider has ordered. Do not use the inhaler more than your health care provider directs you to. 14. Replace cap on inhaler. 15. Follow the directions from your health care provider or the inhaler insert for cleaning the inhaler. If you are using a steroid inhaler, rinse your mouth with water after your last puff, gargle, and spit out the water. Do not swallow the water. AVOID:  Inhaling before or after starting the spray of medicine. It takes practice to coordinate your breathing with triggering the spray.  Inhaling through the nose (rather than the mouth) when triggering the spray. HOW TO DETERMINE IF YOUR INHALER IS FULL OR NEARLY EMPTY You cannot know when an inhaler is empty by shaking it. A few inhalers are now being made with dose counters. Ask your health care provider for a prescription that has a dose counter if you  feel you need that extra help. If your inhaler does not have a counter, ask your health care provider to help you determine the date you need to refill your inhaler. Write the refill date on a calendar or your inhaler canister. Refill your inhaler 7 10 days before it runs out. Be sure to keep an adequate supply of medicine. This includes making sure it is not expired, and you have a spare inhaler.  SEEK MEDICAL CARE IF:   Symptoms are only partially  relieved with your inhaler.  You are having trouble using your inhaler.  You experience some increase in phlegm. SEEK IMMEDIATE MEDICAL CARE IF:   You feel little or no relief with your inhalers. You are still wheezing and are feeling shortness of breath or tightness in your chest or both.  You have dizziness, headaches, or fast heart rate.  You have chills, fever, or night sweats.  There is a noticeable increase in phlegm production, or there is blood in the phlegm. Document Released: 03/09/2007 Document Revised: 01/12/2013 Document Reviewed: 10/28/2012 [facility] Patient Information 2014 [place], [place].

## 2013-10-14 NOTE — Progress Notes (Signed)
Pre visit review using our clinic review tool, if applicable. No additional management support is needed unless otherwise documented below in the visit note/SLS  

## 2013-10-14 NOTE — Progress Notes (Signed)
Patient presents to clinic today c/o 2 weeks of head and chest congestion, PND, productive cough and mild wheezing.  Denies fever, chills, aches, recent travel or sick contact.  Endorses some mild SOB secondary to cough.  Denies pleuritic chest pain.  Past Medical History  Diagnosis Date  . Anemia   . Anxiety   . Depression   . Diverticulosis   . Elevated cholesterol   . Obesity   . THYROMEGALY 07/22/2010  . Overweight 03/11/2010  . Mixed hyperlipidemia 03/11/2010  . MEASLES, HX OF 03/11/2010  . KNEE PAIN, RIGHT 05/28/2010  . HYPERTRIGLYCERIDEMIA 03/11/2010  . HEART MURMUR, HX OF 03/11/2010  . FIBROIDS, UTERUS 03/11/2010  . Diarrhea 08/05/2010  . CONSTIPATION 03/11/2010  . CHICKENPOX, HX OF 03/11/2010  . BIPOLAR AFFECTIVE DISORDER 03/11/2010  . AORTIC VALVE REPLACEMENT, HX OF 03/11/2010  . ANEMIA 05/28/2010  . Acute bronchitis 07/22/2010  . Abdominal pain, generalized 03/19/2010  . Peripheral edema 11/18/2010  . Bacterial vaginitis 11/18/2010  . Hematuria 12/18/2010  . Diverticulitis 12/18/2010  . Hyperthyroidism 12/18/2010  . Fatigue 12/30/2010  . Renal insufficiency 12/30/2010  . Grave's disease 8-12  . Arm lesion 03/23/2011  . UTI (lower urinary tract infection) 08/14/2011  . Breast pain in female 11/17/2011  . Chest pain 11/17/2011  . Hematoma 12/18/2011  . Cervical cancer screening 03/22/2012  . Foot pain, left 03/22/2012  . Foot fracture, left 03/22/2012  . Tick bite of back 10/15/2012  . Preop examination 02/03/2013  . Dysrhythmia   . Hypertension   . Shortness of breath     with exertion   . Heart murmur   . Urinary tract bacterial infections   . Arthritis     Current Outpatient Prescriptions on File Prior to Visit  Medication Sig Dispense Refill  . B Complex-C (B-COMPLEX WITH VITAMIN C) tablet Take 1 tablet by mouth daily.      . Cholecalciferol (VITAMIN D) 2000 UNITS tablet Take 2,000 Units by mouth daily.      . clonazePAM (KLONOPIN) 0.5 MG tablet Take 0.5 mg by mouth 2  (two) times daily.      [name] Calcium (STOOL SOFTENER PO) Take 4 capsules by mouth at bedtime.       . fenofibrate 160 MG tablet Take 1 tablet (160 mg total) by mouth daily.  30 tablet  6  . furosemide (LASIX) 20 MG tablet Take 20 mg by mouth every morning.      [name] Oil 300 MG CAPS Take 300 mg by mouth daily.      [name] Kitchen lamoTRIgine (LAMICTAL) 200 MG tablet Take 200 mg by mouth at bedtime.      . metoprolol succinate (TOPROL-XL) 25 MG 24 hr tablet Take 25 mg by mouth every morning.      . Multiple Vitamin (MULTIVITAMIN WITH MINERALS) TABS tablet Take 1 tablet by mouth daily.      [name] Kitchen oxyCODONE-acetaminophen (PERCOCET) 5-325 MG per tablet Take 1-2 tablets by mouth every 4 (four) hours as needed for pain.  60 tablet  0  . pantoprazole (PROTONIX) 40 MG tablet Take 40 mg by mouth daily.      . pravastatin (PRAVACHOL) 40 MG tablet Take 1 tablet (40 mg total) by mouth at bedtime.  30 tablet  11  . Probiotic Product (ALIGN) 4 MG CAPS Take 1 capsule by mouth daily.  30 capsule  0  . Propylene Glycol (SYSTANE BALANCE) 0.6 % SOLN Place 1 drop into both eyes at bedtime.       [name] Kitchen  QUEtiapine (SEROQUEL) 200 MG tablet Take 400 mg by mouth at bedtime. 2- 200mg  tablets at bedtime      . venlafaxine XR (EFFEXOR-XR) 150 MG 24 hr capsule Take 150 mg by mouth every morning.       . vitamin C (ASCORBIC ACID) 500 MG tablet Take 500 mg by mouth daily.      [name] Kitchen warfarin (COUMADIN) 5 MG tablet 1/2 tablet on all days except 1 tablet on Mondays, Wednesdays, Fridays, and Sundays or as directed by coumadin clinic  35 tablet  3  . albuterol (PROVENTIL HFA;VENTOLIN HFA) 108 (90 BASE) MCG/ACT inhaler Inhale 2 puffs into the lungs every 6 (six) hours as needed for wheezing.  1 Inhaler  1  . [DISCONTINUED] methimazole (TAPAZOLE) 5 MG tablet Take 1 tablet (5 mg total) by mouth 3 (three) times daily.  30 tablet  2   No current facility-administered medications on file prior to visit.    Allergies  Allergen Reactions  .  Sulfonamide Derivatives     headaches    Family History  Problem Relation Age of Onset  . Scoliosis Mother   . Arthritis Mother     Rheumatoid  . Aneurysm Mother     heart  . Alcohol abuse Father   . Depression Sister   . Depression Brother   . Alcohol abuse Brother   . Cancer Maternal Grandmother     colon  . Diabetes Maternal Grandfather   . Heart disease Maternal Grandfather   . Alcohol abuse Maternal Grandfather   . Depression Paternal Grandmother   . Diabetes Paternal Grandmother   . Depression Paternal Grandfather   . Diabetes Paternal Grandfather   . Depression Sister   . Alcohol abuse Brother   . Depression Brother   . Heart disease Brother     History   Social History  . Marital Status: Married    Spouse Name: N/A    Number of Children: N/A  . Years of Education: N/A   Social History Main Topics  . Smoking status: Former Smoker    Quit date: 05/26/1990  . Smokeless tobacco: Never Used  . Alcohol Use: 0.0 oz/week    0 drink(s) per week     Comment: rare  . Drug Use: No  . Sexual Activity: No   Other Topics Concern  . None   Social History Narrative  . None   Review of Systems - See HPI.  All other ROS are negative.  BP 134/72  Pulse 59  Temp(Src) 97.9 F (36.6 C) (Oral)  Resp 16  Ht 5\' 8"  (1.727 m)  Wt 248 lb 8 oz (112.719 kg)  BMI 37.79 kg/m2  SpO2 98%  Physical Exam  Vitals reviewed. Constitutional: She is oriented to person, place, and time and well-developed, well-nourished, and in no distress.  HENT:  Head: Normocephalic and atraumatic.  Right Ear: External ear normal.  Left Ear: External ear normal.  Nose: Nose normal.  Mouth/Throat: Oropharynx is clear and moist. No oropharyngeal exudate.  TM within normal limits bilaterally.  Eyes: Conjunctivae and EOM are normal. Pupils are equal, round, and reactive to light.  Neck: Neck supple.  Cardiovascular: Normal rate, regular rhythm and intact distal pulses.   Mechanical valve  closure S 2 compatible with aortic position.    Pulmonary/Chest: Effort normal. No respiratory distress. She has wheezes. She has no rales. She exhibits no tenderness.  Lymphadenopathy:    She has no cervical adenopathy.  Neurological: She is alert and oriented  to person, place, and time.  Skin: Skin is warm and dry. No rash noted.  Psychiatric: Affect normal.    Recent Results (from the past 2160 hour(s))  POCT INR     Status: None   Collection Time    07/26/13  4:10 PM      Result Value Ref Range   INR 2.7    POCT INR     Status: None   Collection Time    08/23/13  4:07 PM      Result Value Ref Range   INR 2.6    POCT INR     Status: None   Collection Time    09/13/13  4:31 PM      Result Value Ref Range   INR 2.4    POCT INR     Status: None   Collection Time    09/27/13  3:28 PM      Result Value Ref Range   INR 2.2    POCT INR     Status: None   Collection Time    10/11/13 11:42 AM      Result Value Ref Range   INR 2.4     Assessment/Plan: Acute bronchitis Rx Amoxicillin. Rx Tussionex.  Rx albuterol inhaler.  Increase fluids.  Rest.  Saline nasal spray.  Probiotic.  Humidifier in bedroom.  Call or return to clinic if symptoms are not improving.

## 2013-10-17 DIAGNOSIS — J209 Acute bronchitis, unspecified: Secondary | ICD-10-CM | POA: Insufficient documentation

## 2013-10-17 NOTE — Assessment & Plan Note (Signed)
Rx Amoxicillin. Rx Tussionex.  Rx albuterol inhaler.  Increase fluids.  Rest.  Saline nasal spray.  Probiotic.  Humidifier in bedroom.  Call or return to clinic if symptoms are not improving.

## 2013-10-25 ENCOUNTER — Ambulatory Visit (INDEPENDENT_AMBULATORY_CARE_PROVIDER_SITE_OTHER): Payer: BC Managed Care – PPO

## 2013-10-25 DIAGNOSIS — Z5181 Encounter for therapeutic drug level monitoring: Secondary | ICD-10-CM

## 2013-10-25 DIAGNOSIS — I4891 Unspecified atrial fibrillation: Secondary | ICD-10-CM

## 2013-10-25 DIAGNOSIS — I359 Nonrheumatic aortic valve disorder, unspecified: Secondary | ICD-10-CM

## 2013-10-25 DIAGNOSIS — Z954 Presence of other heart-valve replacement: Secondary | ICD-10-CM

## 2013-10-25 LAB — POCT INR: INR: 3.5

## 2013-10-26 ENCOUNTER — Telehealth: Payer: Self-pay | Admitting: Family Medicine

## 2013-10-26 MED ORDER — PANTOPRAZOLE SODIUM 40 MG PO TBEC
40.0000 mg | DELAYED_RELEASE_TABLET | Freq: Every day | ORAL | Status: DC
Start: 2013-10-26 — End: 2013-12-28

## 2013-10-26 MED ORDER — FUROSEMIDE 20 MG PO TABS
20.0000 mg | ORAL_TABLET | Freq: Every morning | ORAL | Status: DC
Start: 2013-10-26 — End: 2013-12-28

## 2013-10-26 NOTE — Telephone Encounter (Signed)
Refill- furosemide  Refill- pantoprazole  [facility] [place] [address]

## 2013-11-15 ENCOUNTER — Ambulatory Visit (INDEPENDENT_AMBULATORY_CARE_PROVIDER_SITE_OTHER): Payer: BC Managed Care – PPO | Admitting: Pharmacist

## 2013-11-15 DIAGNOSIS — I4891 Unspecified atrial fibrillation: Secondary | ICD-10-CM

## 2013-11-15 DIAGNOSIS — I359 Nonrheumatic aortic valve disorder, unspecified: Secondary | ICD-10-CM

## 2013-11-15 DIAGNOSIS — Z954 Presence of other heart-valve replacement: Secondary | ICD-10-CM

## 2013-11-15 DIAGNOSIS — Z5181 Encounter for therapeutic drug level monitoring: Secondary | ICD-10-CM

## 2013-11-15 LAB — POCT INR: INR: 2.3

## 2013-11-28 ENCOUNTER — Ambulatory Visit (INDEPENDENT_AMBULATORY_CARE_PROVIDER_SITE_OTHER): Payer: BC Managed Care – PPO | Admitting: Pharmacist

## 2013-11-28 DIAGNOSIS — I4891 Unspecified atrial fibrillation: Secondary | ICD-10-CM

## 2013-11-28 DIAGNOSIS — Z5181 Encounter for therapeutic drug level monitoring: Secondary | ICD-10-CM

## 2013-11-28 DIAGNOSIS — Z954 Presence of other heart-valve replacement: Secondary | ICD-10-CM

## 2013-11-28 DIAGNOSIS — I359 Nonrheumatic aortic valve disorder, unspecified: Secondary | ICD-10-CM

## 2013-11-28 LAB — POCT INR: INR: 2.5

## 2013-12-19 ENCOUNTER — Telehealth: Payer: Self-pay | Admitting: Interventional Cardiology

## 2013-12-19 ENCOUNTER — Ambulatory Visit (INDEPENDENT_AMBULATORY_CARE_PROVIDER_SITE_OTHER): Payer: BC Managed Care – PPO

## 2013-12-19 DIAGNOSIS — Z954 Presence of other heart-valve replacement: Secondary | ICD-10-CM

## 2013-12-19 DIAGNOSIS — I359 Nonrheumatic aortic valve disorder, unspecified: Secondary | ICD-10-CM

## 2013-12-19 DIAGNOSIS — Z5181 Encounter for therapeutic drug level monitoring: Secondary | ICD-10-CM

## 2013-12-19 DIAGNOSIS — I4891 Unspecified atrial fibrillation: Secondary | ICD-10-CM

## 2013-12-19 LAB — POCT INR: INR: 1.8

## 2013-12-19 NOTE — Telephone Encounter (Signed)
Advised patient that her INRs have been low for past few checks, and it appears she just needs a higher warfarin dosage to get INR back to 2.5-3.5.  She understands and will comply with dosage instructions from clinic today.

## 2013-12-19 NOTE — Telephone Encounter (Signed)
New Message  Pt called requests a call back from [name]. Pt reports her coumadin is really low and she asks is there anything she would need to do or if there is anything that she is doing wrong. Please call

## 2013-12-28 ENCOUNTER — Other Ambulatory Visit: Payer: Self-pay | Admitting: Family Medicine

## 2013-12-28 ENCOUNTER — Telehealth: Payer: Self-pay | Admitting: Family Medicine

## 2013-12-28 NOTE — Telephone Encounter (Signed)
Refill-metoprolol  [facility] on [address]

## 2013-12-28 NOTE — Telephone Encounter (Signed)
Refills sent on furosemide and pantoprazole. Pt is due for f/u with Dr Charlett Blake, please call pt to arrange.

## 2013-12-29 MED ORDER — METOPROLOL SUCCINATE ER 25 MG PO TB24: 25.0000 mg | ORAL_TABLET | Freq: Every morning | ORAL | Status: DC

## 2013-12-30 NOTE — Telephone Encounter (Signed)
Left message for patient to return my call.

## 2014-01-02 ENCOUNTER — Ambulatory Visit (INDEPENDENT_AMBULATORY_CARE_PROVIDER_SITE_OTHER): Payer: BC Managed Care – PPO | Admitting: Pharmacist

## 2014-01-02 DIAGNOSIS — Z5181 Encounter for therapeutic drug level monitoring: Secondary | ICD-10-CM

## 2014-01-02 DIAGNOSIS — I4891 Unspecified atrial fibrillation: Secondary | ICD-10-CM

## 2014-01-02 DIAGNOSIS — Z954 Presence of other heart-valve replacement: Secondary | ICD-10-CM

## 2014-01-02 DIAGNOSIS — I359 Nonrheumatic aortic valve disorder, unspecified: Secondary | ICD-10-CM

## 2014-01-02 LAB — POCT INR: INR: 2.9

## 2014-01-03 NOTE — Telephone Encounter (Signed)
Informed patient of this and she scheduled appointment for 02/03/14

## 2014-01-23 ENCOUNTER — Ambulatory Visit (INDEPENDENT_AMBULATORY_CARE_PROVIDER_SITE_OTHER): Payer: BC Managed Care – PPO | Admitting: *Deleted

## 2014-01-23 DIAGNOSIS — I359 Nonrheumatic aortic valve disorder, unspecified: Secondary | ICD-10-CM

## 2014-01-23 DIAGNOSIS — Z954 Presence of other heart-valve replacement: Secondary | ICD-10-CM

## 2014-01-23 DIAGNOSIS — Z5181 Encounter for therapeutic drug level monitoring: Secondary | ICD-10-CM

## 2014-01-23 DIAGNOSIS — I4891 Unspecified atrial fibrillation: Secondary | ICD-10-CM

## 2014-01-23 LAB — POCT INR: INR: 2.6

## 2014-02-03 ENCOUNTER — Ambulatory Visit: Payer: BC Managed Care – PPO | Admitting: Family Medicine

## 2014-02-21 ENCOUNTER — Ambulatory Visit (INDEPENDENT_AMBULATORY_CARE_PROVIDER_SITE_OTHER): Payer: BC Managed Care – PPO | Admitting: *Deleted

## 2014-02-21 DIAGNOSIS — I4891 Unspecified atrial fibrillation: Secondary | ICD-10-CM

## 2014-02-21 DIAGNOSIS — Z954 Presence of other heart-valve replacement: Secondary | ICD-10-CM

## 2014-02-21 DIAGNOSIS — Z5181 Encounter for therapeutic drug level monitoring: Secondary | ICD-10-CM

## 2014-02-21 LAB — POCT INR: INR: 5.6

## 2014-02-24 ENCOUNTER — Telehealth: Payer: Self-pay | Admitting: Family Medicine

## 2014-02-24 ENCOUNTER — Ambulatory Visit (INDEPENDENT_AMBULATORY_CARE_PROVIDER_SITE_OTHER): Payer: BC Managed Care – PPO | Admitting: Family Medicine

## 2014-02-24 ENCOUNTER — Other Ambulatory Visit: Payer: Self-pay | Admitting: Interventional Cardiology

## 2014-02-24 VITALS — BP 133/86 | HR 62 | Temp 98.8°F | Wt 215.2 lb

## 2014-02-24 DIAGNOSIS — I480 Paroxysmal atrial fibrillation: Secondary | ICD-10-CM

## 2014-02-24 DIAGNOSIS — N390 Urinary tract infection, site not specified: Secondary | ICD-10-CM

## 2014-02-24 DIAGNOSIS — K5900 Constipation, unspecified: Secondary | ICD-10-CM

## 2014-02-24 DIAGNOSIS — D510 Vitamin B12 deficiency anemia due to intrinsic factor deficiency: Secondary | ICD-10-CM

## 2014-02-24 DIAGNOSIS — E538 Deficiency of other specified B group vitamins: Secondary | ICD-10-CM

## 2014-02-24 DIAGNOSIS — Z23 Encounter for immunization: Secondary | ICD-10-CM

## 2014-02-24 DIAGNOSIS — E782 Mixed hyperlipidemia: Secondary | ICD-10-CM

## 2014-02-24 DIAGNOSIS — H60393 Other infective otitis externa, bilateral: Secondary | ICD-10-CM

## 2014-02-24 DIAGNOSIS — E663 Overweight: Secondary | ICD-10-CM

## 2014-02-24 DIAGNOSIS — N289 Disorder of kidney and ureter, unspecified: Secondary | ICD-10-CM

## 2014-02-24 DIAGNOSIS — I10 Essential (primary) hypertension: Secondary | ICD-10-CM

## 2014-02-24 LAB — RENAL FUNCTION PANEL
Albumin: 4.8 g/dL (ref 3.5–5.2)
BUN: 11 mg/dL (ref 6–23)
CO2: 28 mEq/L (ref 19–32)
Calcium: 10 mg/dL (ref 8.4–10.5)
Chloride: 104 mEq/L (ref 96–112)
Creatinine, Ser: 0.9 mg/dL (ref 0.4–1.2)
GFR: 65.48 mL/min (ref >60.00)
Glucose, Bld: 95 mg/dL (ref 70–99)
Phosphorus: 3.3 mg/dL (ref 2.3–4.6)
Potassium: 4.3 mEq/L (ref 3.5–5.1)
Sodium: 142 mEq/L (ref 135–145)

## 2014-02-24 LAB — TSH: TSH: 1.65 u[IU]/mL (ref 0.35–4.50)

## 2014-02-24 LAB — CBC
HCT: 41.8 % (ref 36.0–46.0)
Hemoglobin: 14.1 g/dL (ref 12.0–15.0)
MCHC: 33.6 g/dL (ref 30.0–36.0)
MCV: 91.2 fl (ref 78.0–100.0)
Platelets: 219 10*3/uL (ref 150.0–400.0)
RBC: 4.59 Mil/uL (ref 3.87–5.11)
RDW: 13.8 % (ref 11.5–15.5)
WBC: 6.5 10*3/uL (ref 4.0–10.5)

## 2014-02-24 LAB — HEPATIC FUNCTION PANEL
ALT: 32 U/L (ref 0–35)
AST: 45 U/L — ABNORMAL HIGH (ref 0–37)
Albumin: 4.8 g/dL (ref 3.5–5.2)
Alkaline Phosphatase: 64 U/L (ref 39–117)
Bilirubin, Direct: 0.1 mg/dL (ref 0.0–0.3)
Total Bilirubin: 0.9 mg/dL (ref 0.2–1.2)
Total Protein: 8.5 g/dL — ABNORMAL HIGH (ref 6.0–8.3)

## 2014-02-24 LAB — LIPID PANEL
Cholesterol: 211 mg/dL — ABNORMAL HIGH (ref 0–200)
HDL: 51.3 mg/dL (ref >39.00)
NonHDL: 159.7
Total CHOL/HDL Ratio: 4
Triglycerides: 253 mg/dL — ABNORMAL HIGH (ref 0.0–149.0)
VLDL: 50.6 mg/dL — ABNORMAL HIGH (ref 0.0–40.0)

## 2014-02-24 LAB — VITAMIN B12: Vitamin B-12: 645 pg/mL (ref 211–911)

## 2014-02-24 LAB — LDL CHOLESTEROL, DIRECT: Direct LDL: 118.6 mg/dL

## 2014-02-24 MED ORDER — NEOMYCIN-POLYMYXIN-HC 3.5-10000-1 OT SOLN: 3.0000 [drp] | Freq: Two times a day (BID) | OTIC | Status: DC | PRN

## 2014-02-24 NOTE — Progress Notes (Signed)
Pre visit review using our clinic review tool, if applicable. No additional management support is needed unless otherwise documented below in the visit note. 

## 2014-02-24 NOTE — Telephone Encounter (Signed)
Pharmacist was informed that this was ordered on wrong pt.  Spoke to ED

## 2014-02-24 NOTE — Telephone Encounter (Signed)
Caller name: Altha Harm  Relation to pt: other Call back number: [phone] Pharmacy: [facility]   Reason for call:   in need of clarification of direction regarding the ear drop medication received 2 different scripts

## 2014-02-26 ENCOUNTER — Encounter: Payer: Self-pay | Admitting: Family Medicine

## 2014-02-26 NOTE — Assessment & Plan Note (Signed)
INR up this week doses were held and she will recheck, asymptomatic

## 2014-02-26 NOTE — Assessment & Plan Note (Signed)
Improved, mild

## 2014-02-26 NOTE — Assessment & Plan Note (Signed)
Well controlled, no changes to meds. Encouraged heart healthy diet such as the DASH diet and exercise as tolerated.  °

## 2014-02-26 NOTE — Assessment & Plan Note (Signed)
Asymptomatic today. 

## 2014-02-26 NOTE — Assessment & Plan Note (Signed)
B12 level wnl

## 2014-02-26 NOTE — Assessment & Plan Note (Signed)
Encouraged DASH diet, decrease po intake and increase exercise as tolerated. Needs 7-8 hours of sleep nightly. Avoid trans fats, eat small, frequent meals every 4-5 hours with lean proteins, complex carbs and healthy fats. Minimize simple carbs, GMO foods. 

## 2014-02-26 NOTE — Progress Notes (Signed)
Patient ID: [name], female   DOB: 01-May-1951, 63 y.o.   MRN: [id number] JAVON HUPFER [id number] 09/26/1950 02/26/2014      Progress Note-Follow Up  Subjective  Chief Complaint  Chief Complaint  Patient presents with  . Follow-up    hypertension, cholesterol    HPI  Patient is a 63 year old female in today for routine medical care. Patient is in today for followup. Her INR recently spiked 0.7 and other than some bleeding from last night lesion she's had no symptoms. They're monitoring it helped. No bloody or tarry stool. No recent illness. Denies fevers or chest pain. Denies CP/palp/SOB/HA/congestion/fevers/GI or GU c/o. Taking meds as prescribed  Past Medical History  Diagnosis Date  . Anemia   . Anxiety   . Depression   . Diverticulosis   . Elevated cholesterol   . Obesity   . THYROMEGALY 07/22/2010  . Overweight(278.02) 03/11/2010  . Mixed hyperlipidemia 03/11/2010  . MEASLES, HX OF 03/11/2010  . KNEE PAIN, RIGHT 05/28/2010  . HYPERTRIGLYCERIDEMIA 03/11/2010  . HEART MURMUR, HX OF 03/11/2010  . FIBROIDS, UTERUS 03/11/2010  . Diarrhea 08/05/2010  . CONSTIPATION 03/11/2010  . CHICKENPOX, HX OF 03/11/2010  . BIPOLAR AFFECTIVE DISORDER 03/11/2010  . AORTIC VALVE REPLACEMENT, HX OF 03/11/2010  . ANEMIA 05/28/2010  . Acute bronchitis 07/22/2010  . Abdominal pain, generalized 03/19/2010  . Peripheral edema 11/18/2010  . Bacterial vaginitis 11/18/2010  . Hematuria 12/18/2010  . Diverticulitis 12/18/2010  . Hyperthyroidism 12/18/2010  . Fatigue 12/30/2010  . Renal insufficiency 12/30/2010  . Grave's disease 8-12  . Arm lesion 03/23/2011  . UTI (lower urinary tract infection) 08/14/2011  . Breast pain in female 11/17/2011  . Chest pain 11/17/2011  . Hematoma 12/18/2011  . Cervical cancer screening 03/22/2012  . Foot pain, left 03/22/2012  . Foot fracture, left 03/22/2012  . Tick bite of back 10/15/2012  . Preop examination 02/03/2013  . Dysrhythmia   . Hypertension   . Shortness  of breath     with exertion   . Heart murmur   . Urinary tract bacterial infections   . Arthritis     Past Surgical History  Procedure Laterality Date  . Appendectomy      Required revision for EColi infection, required recurrent packing  . Bowel obstruction      Requiring adhesions to be removed  . Aortic valve replacement    . Open heart with a cardiac aneurysm repair    . Cholecystectomy    . Tubal ligation    . Varicose vein surgery b/l legs    . Childhood exploratory surgery of heart    . Hernia repair  08-16-10  . Abdominal hysterectomy      sb/l spo, total  . Total knee arthroplasty Right 02/24/2013    Procedure: RIGHT TOTAL KNEE ARTHROPLASTY;  Surgeon: Johnn Hai, MD;  Location: [facility];  Service: Orthopedics;  Laterality: Right;    Family History  Problem Relation Age of Onset  . Scoliosis Mother   . Arthritis Mother     Rheumatoid  . Aneurysm Mother     heart  . Alcohol abuse Father   . Depression Sister   . Depression Brother   . Alcohol abuse Brother   . Cancer Maternal Grandmother     colon  . Diabetes Maternal Grandfather   . Heart disease Maternal Grandfather   . Alcohol abuse Maternal Grandfather   . Depression Paternal Grandmother   . Diabetes Paternal Grandmother   .  Depression Paternal Grandfather   . Diabetes Paternal Grandfather   . Depression Sister   . Alcohol abuse Brother   . Depression Brother   . Heart disease Brother     History   Social History  . Marital Status: Married    Spouse Name: N/A    Number of Children: N/A  . Years of Education: N/A   Occupational History  . Not on file.   Social History Main Topics  . Smoking status: Former Smoker    Quit date: 05/26/1990  . Smokeless tobacco: Never Used  . Alcohol Use: 0.0 oz/week    0 drink(s) per week     Comment: rare  . Drug Use: No  . Sexual Activity: No   Other Topics Concern  . Not on file   Social History Narrative  . No narrative on file    Current  Outpatient Prescriptions on File Prior to Visit  Medication Sig Dispense Refill  . B Complex-C (B-COMPLEX WITH VITAMIN C) tablet Take 1 tablet by mouth daily.      . chlorpheniramine-HYDROcodone (TUSSIONEX PENNKINETIC ER) 10-8 MG/5ML LQCR Take 5 mLs by mouth every 12 (twelve) hours as needed.  115 mL  0  . Cholecalciferol (VITAMIN D) 2000 UNITS tablet Take 2,000 Units by mouth daily.      . clonazePAM (KLONOPIN) 0.5 MG tablet Take 0.5 mg by mouth 2 (two) times daily.      [name] (STOOL SOFTENER PO) Take 4 capsules by mouth at bedtime.       . fenofibrate 160 MG tablet Take 1 tablet (160 mg total) by mouth daily.  30 tablet  6  . furosemide (LASIX) 20 MG tablet TAKE 1 TABLET (20 MG TOTAL) BY MOUTH EVERY MORNING.  30 tablet  1  . Krill Oil 300 MG CAPS Take 300 mg by mouth daily.      Marland Kitchen lamoTRIgine (LAMICTAL) 200 MG tablet Take 200 mg by mouth at bedtime.      . metoprolol succinate (TOPROL-XL) 25 MG 24 hr tablet Take 1 tablet (25 mg total) by mouth every morning.  30 tablet  5  . Multiple Vitamin (MULTIVITAMIN WITH MINERALS) TABS tablet Take 1 tablet by mouth daily.      Marland Kitchen oxyCODONE-acetaminophen (PERCOCET) 5-325 MG per tablet Take 1-2 tablets by mouth every 4 (four) hours as needed for pain.  60 tablet  0  . pantoprazole (PROTONIX) 40 MG tablet TAKE 1 TABLET (40 MG TOTAL) BY MOUTH DAILY.  30 tablet  1  . pravastatin (PRAVACHOL) 40 MG tablet Take 1 tablet (40 mg total) by mouth at bedtime.  30 tablet  11  . Probiotic Product (ALIGN) 4 MG CAPS Take 1 capsule by mouth daily.  30 capsule  0  . Propylene Glycol (SYSTANE BALANCE) 0.6 % SOLN Place 1 drop into both eyes at bedtime.       Marland Kitchen QUEtiapine (SEROQUEL) 200 MG tablet Take 400 mg by mouth at bedtime. 2- 200mg  tablets at bedtime      . venlafaxine XR (EFFEXOR-XR) 150 MG 24 hr capsule Take 150 mg by mouth every morning.       . vitamin C (ASCORBIC ACID) 500 MG tablet Take 500 mg by mouth daily.      Marland Kitchen warfarin (COUMADIN) 5 MG tablet 1/2  tablet on all days except 1 tablet on Mondays, Wednesdays, Fridays, and Sundays or as directed by coumadin clinic  35 tablet  3  . albuterol (PROVENTIL HFA;VENTOLIN HFA) 108 (  90 BASE) MCG/ACT inhaler Inhale 2 puffs into the lungs every 6 (six) hours as needed for wheezing or shortness of breath.  1 Inhaler  0  . [DISCONTINUED] methimazole (TAPAZOLE) 5 MG tablet Take 1 tablet (5 mg total) by mouth 3 (three) times daily.  30 tablet  2   No current facility-administered medications on file prior to visit.    Allergies  Allergen Reactions  . Sulfonamide Derivatives     headaches    Review of Systems  Review of Systems  Constitutional: Negative for fever and malaise/fatigue.  HENT: Negative for congestion.   Eyes: Negative for discharge.  Respiratory: Negative for shortness of breath.   Cardiovascular: Negative for chest pain, palpitations and leg swelling.  Gastrointestinal: Negative for nausea, abdominal pain and diarrhea.  Genitourinary: Negative for dysuria.  Musculoskeletal: Negative for falls.  Skin: Negative for rash.       Sore on tongue x 24 hours  Neurological: Negative for loss of consciousness and headaches.  Endo/Heme/Allergies: Negative for polydipsia.  Psychiatric/Behavioral: Negative for depression and suicidal ideas. The patient is not nervous/anxious and does not have insomnia.     Objective  BP 133/86  Pulse 62  Temp(Src) 98.8 F (37.1 C) (Oral)  Wt 215 lb 3.2 oz (97.614 kg)  SpO2 96%  Physical Exam  Physical Exam  Constitutional: She is oriented to person, place, and time and well-developed, well-nourished, and in no distress. No distress.  HENT:  Head: Normocephalic and atraumatic.  Small sores on either side of tongue. L>R  Eyes: Conjunctivae are normal.  Neck: Neck supple. No thyromegaly present.  Cardiovascular: Normal rate.   Murmur heard. Pulmonary/Chest: Effort normal and breath sounds normal. She has no wheezes.  Abdominal: She exhibits no  distension and no mass.  Musculoskeletal: She exhibits no edema.  Lymphadenopathy:    She has no cervical adenopathy.  Neurological: She is alert and oriented to person, place, and time.  Skin: Skin is warm and dry. No rash noted. She is not diaphoretic.  Psychiatric: Memory, affect and judgment normal.    Lab Results  Component Value Date   TSH 1.65 02/24/2014   Lab Results  Component Value Date   WBC 6.5 02/24/2014   HGB 14.1 02/24/2014   HCT 41.8 02/24/2014   MCV 91.2 02/24/2014   PLT 219.0 02/24/2014   Lab Results  Component Value Date   CREATININE 0.9 02/24/2014   BUN 11 02/24/2014   NA 142 02/24/2014   K 4.3 02/24/2014   CL 104 02/24/2014   CO2 28 02/24/2014   Lab Results  Component Value Date   ALT 32 02/24/2014   AST 45* 02/24/2014   ALKPHOS 64 02/24/2014   BILITOT 0.9 02/24/2014   Lab Results  Component Value Date   CHOL 211* 02/24/2014   Lab Results  Component Value Date   HDL 51.30 02/24/2014   Lab Results  Component Value Date   LDLCALC 71 04/04/2013   Lab Results  Component Value Date   TRIG 253.0* 02/24/2014   Lab Results  Component Value Date   CHOLHDL 4 02/24/2014     Assessment & Plan  Pernicious anemia B12 level wnl  Recurrent UTI Asymptomatic today  Renal insufficiency Improved, mild  Overweight Encouraged DASH diet, decrease po intake and increase exercise as tolerated. Needs 7-8 hours of sleep nightly. Avoid trans fats, eat small, frequent meals every 4-5 hours with lean proteins, complex carbs and healthy fats. Minimize simple carbs, GMO foods.  Constipation Encouraged increased  hydration and fiber in diet. Daily probiotics. If bowels not moving can use MOM 2 tbls po in 4 oz of warm prune juice by mouth every 2-3 days. If no results then repeat in 4 hours with  Dulcolax suppository pr, may repeat again in 4 more hours as needed. Seek care if symptoms worsen. Consider daily Miralax and/or Dulcolax if symptoms persist.   Essential  hypertension, benign Well controlled, no changes to meds. Encouraged heart healthy diet such as the DASH diet and exercise as tolerated.   Atrial fibrillation INR up this week doses were held and she will recheck, asymptomatic

## 2014-02-26 NOTE — Assessment & Plan Note (Signed)
Encouraged increased hydration and fiber in diet. Daily probiotics. If bowels not moving can use MOM 2 tbls po in 4 oz of warm prune juice by mouth every 2-3 days. If no results then repeat in 4 hours with  Dulcolax suppository pr, may repeat again in 4 more hours as needed. Seek care if symptoms worsen. Consider daily Miralax and/or Dulcolax if symptoms persist.  

## 2014-02-27 ENCOUNTER — Other Ambulatory Visit: Payer: Self-pay | Admitting: *Deleted

## 2014-03-07 ENCOUNTER — Ambulatory Visit (INDEPENDENT_AMBULATORY_CARE_PROVIDER_SITE_OTHER): Payer: BC Managed Care – PPO | Admitting: *Deleted

## 2014-03-07 DIAGNOSIS — I4891 Unspecified atrial fibrillation: Secondary | ICD-10-CM | POA: Diagnosis not present

## 2014-03-07 DIAGNOSIS — Z952 Presence of prosthetic heart valve: Secondary | ICD-10-CM

## 2014-03-07 DIAGNOSIS — I480 Paroxysmal atrial fibrillation: Secondary | ICD-10-CM

## 2014-03-07 DIAGNOSIS — Z954 Presence of other heart-valve replacement: Secondary | ICD-10-CM

## 2014-03-07 DIAGNOSIS — I359 Nonrheumatic aortic valve disorder, unspecified: Secondary | ICD-10-CM | POA: Diagnosis not present

## 2014-03-07 DIAGNOSIS — Z5181 Encounter for therapeutic drug level monitoring: Secondary | ICD-10-CM

## 2014-03-07 LAB — POCT INR: INR: 2.9

## 2014-04-06 ENCOUNTER — Emergency Department (HOSPITAL_BASED_OUTPATIENT_CLINIC_OR_DEPARTMENT_OTHER): Payer: BC Managed Care – PPO

## 2014-04-06 ENCOUNTER — Emergency Department (HOSPITAL_BASED_OUTPATIENT_CLINIC_OR_DEPARTMENT_OTHER)
Admission: EM | Admit: 2014-04-06 | Discharge: 2014-04-06 | Disposition: A | Discharge status: Home / Self Care | Payer: BC Managed Care – PPO | Attending: Emergency Medicine | Admitting: Emergency Medicine

## 2014-04-06 ENCOUNTER — Encounter (HOSPITAL_BASED_OUTPATIENT_CLINIC_OR_DEPARTMENT_OTHER): Payer: Self-pay | Admitting: *Deleted

## 2014-04-06 DIAGNOSIS — Z954 Presence of other heart-valve replacement: Secondary | ICD-10-CM | POA: Diagnosis not present

## 2014-04-06 DIAGNOSIS — Y93E1 Activity, personal bathing and showering: Secondary | ICD-10-CM | POA: Diagnosis not present

## 2014-04-06 DIAGNOSIS — Z87448 Personal history of other diseases of urinary system: Secondary | ICD-10-CM | POA: Diagnosis not present

## 2014-04-06 DIAGNOSIS — Z8742 Personal history of other diseases of the female genital tract: Secondary | ICD-10-CM | POA: Diagnosis not present

## 2014-04-06 DIAGNOSIS — F419 Anxiety disorder, unspecified: Secondary | ICD-10-CM | POA: Diagnosis not present

## 2014-04-06 DIAGNOSIS — E0500 Thyrotoxicosis with diffuse goiter without thyrotoxic crisis or storm: Secondary | ICD-10-CM | POA: Insufficient documentation

## 2014-04-06 DIAGNOSIS — Z87891 Personal history of nicotine dependence: Secondary | ICD-10-CM | POA: Diagnosis not present

## 2014-04-06 DIAGNOSIS — Z8709 Personal history of other diseases of the respiratory system: Secondary | ICD-10-CM | POA: Insufficient documentation

## 2014-04-06 DIAGNOSIS — S0990XA Unspecified injury of head, initial encounter: Secondary | ICD-10-CM | POA: Diagnosis present

## 2014-04-06 DIAGNOSIS — Z8781 Personal history of (healed) traumatic fracture: Secondary | ICD-10-CM | POA: Insufficient documentation

## 2014-04-06 DIAGNOSIS — I10 Essential (primary) hypertension: Secondary | ICD-10-CM | POA: Insufficient documentation

## 2014-04-06 DIAGNOSIS — Z79899 Other long term (current) drug therapy: Secondary | ICD-10-CM | POA: Insufficient documentation

## 2014-04-06 DIAGNOSIS — Y9289 Other specified places as the place of occurrence of the external cause: Secondary | ICD-10-CM | POA: Insufficient documentation

## 2014-04-06 DIAGNOSIS — S060X0A Concussion without loss of consciousness, initial encounter: Secondary | ICD-10-CM | POA: Insufficient documentation

## 2014-04-06 DIAGNOSIS — Y998 Other external cause status: Secondary | ICD-10-CM | POA: Diagnosis not present

## 2014-04-06 DIAGNOSIS — Z7901 Long term (current) use of anticoagulants: Secondary | ICD-10-CM | POA: Insufficient documentation

## 2014-04-06 DIAGNOSIS — R011 Cardiac murmur, unspecified: Secondary | ICD-10-CM | POA: Insufficient documentation

## 2014-04-06 DIAGNOSIS — E780 Pure hypercholesterolemia: Secondary | ICD-10-CM | POA: Diagnosis not present

## 2014-04-06 DIAGNOSIS — Z87440 Personal history of urinary (tract) infections: Secondary | ICD-10-CM | POA: Insufficient documentation

## 2014-04-06 DIAGNOSIS — F319 Bipolar disorder, unspecified: Secondary | ICD-10-CM | POA: Diagnosis not present

## 2014-04-06 DIAGNOSIS — E782 Mixed hyperlipidemia: Secondary | ICD-10-CM | POA: Diagnosis not present

## 2014-04-06 DIAGNOSIS — Z952 Presence of prosthetic heart valve: Secondary | ICD-10-CM | POA: Insufficient documentation

## 2014-04-06 DIAGNOSIS — W01198A Fall on same level from slipping, tripping and stumbling with subsequent striking against other object, initial encounter: Secondary | ICD-10-CM | POA: Diagnosis not present

## 2014-04-06 DIAGNOSIS — Z8619 Personal history of other infectious and parasitic diseases: Secondary | ICD-10-CM | POA: Diagnosis not present

## 2014-04-06 DIAGNOSIS — E669 Obesity, unspecified: Secondary | ICD-10-CM | POA: Insufficient documentation

## 2014-04-06 DIAGNOSIS — S0093XA Contusion of unspecified part of head, initial encounter: Secondary | ICD-10-CM

## 2014-04-06 LAB — CBC
HCT: 37.2 % (ref 36.0–46.0)
Hemoglobin: 12.6 g/dL (ref 12.0–15.0)
MCH: 31.2 pg (ref 26.0–34.0)
MCHC: 33.9 g/dL (ref 30.0–36.0)
MCV: 92.1 fL (ref 78.0–100.0)
Platelets: 214 10*3/uL (ref 150–400)
RBC: 4.04 MIL/uL (ref 3.87–5.11)
RDW: 13.8 % (ref 11.5–15.5)
WBC: 5.4 10*3/uL (ref 4.0–10.5)

## 2014-04-06 LAB — BASIC METABOLIC PANEL
Anion gap: 11 (ref 5–15)
BUN: 16 mg/dL (ref 6–23)
CO2: 30 mEq/L (ref 19–32)
Calcium: 9.4 mg/dL (ref 8.4–10.5)
Chloride: 100 mEq/L (ref 96–112)
Creatinine, Ser: 0.9 mg/dL (ref 0.50–1.10)
GFR calc Af Amer: 77 mL/min — ABNORMAL LOW (ref >90)
GFR calc non Af Amer: 67 mL/min — ABNORMAL LOW (ref >90)
Glucose, Bld: 92 mg/dL (ref 70–99)
Potassium: 4.3 mEq/L (ref 3.7–5.3)
Sodium: 141 mEq/L (ref 137–147)

## 2014-04-06 LAB — PROTIME-INR
INR: 2.51 — ABNORMAL HIGH (ref 0.00–1.49)
Prothrombin Time: 27.1 seconds — ABNORMAL HIGH (ref 11.6–15.2)

## 2014-04-06 NOTE — ED Provider Notes (Signed)
CSN: [id number]     Arrival date & time 04/06/14  1613 History   First MD Initiated Contact with Patient 04/06/14 1802     Chief Complaint  Patient presents with  . Head Injury     Patient is a 63 y.o. female presenting with head injury. The history is provided by the patient.  Head Injury Location:  Occipital Time since incident:  1 day Mechanism of injury: fall   Pain details:    Quality:  Aching   Severity:  Mild   Duration:  1 day   Timing:  Constant Chronicity:  New Relieved by:  Nothing Worsened by:  Nothing tried Ineffective treatments:  None tried Associated symptoms: no blurred vision, no difficulty breathing, no disorientation and no double vision   Associated symptoms comment:  Patient has felt fatigued she did not lose consciousness. She does not complain of neck pain or any other pain elsewhere.patient has a history of mechanical heart valve.  She takes Coumadin. She has felt fatigued associated with this headache today so she decided to come in and get evaluated.  Past Medical History  Diagnosis Date  . Anemia   . Anxiety   . Depression   . Diverticulosis   . Elevated cholesterol   . Obesity   . THYROMEGALY 07/22/2010  . Overweight(278.02) 03/11/2010  . Mixed hyperlipidemia 03/11/2010  . MEASLES, HX OF 03/11/2010  . KNEE PAIN, RIGHT 05/28/2010  . HYPERTRIGLYCERIDEMIA 03/11/2010  . HEART MURMUR, HX OF 03/11/2010  . FIBROIDS, UTERUS 03/11/2010  . Diarrhea 08/05/2010  . CONSTIPATION 03/11/2010  . CHICKENPOX, HX OF 03/11/2010  . BIPOLAR AFFECTIVE DISORDER 03/11/2010  . AORTIC VALVE REPLACEMENT, HX OF 03/11/2010  . ANEMIA 05/28/2010  . Acute bronchitis 07/22/2010  . Abdominal pain, generalized 03/19/2010  . Peripheral edema 11/18/2010  . Bacterial vaginitis 11/18/2010  . Hematuria 12/18/2010  . Diverticulitis 12/18/2010  . Hyperthyroidism 12/18/2010  . Fatigue 12/30/2010  . Renal insufficiency 12/30/2010  . Grave's disease 8-12  . Arm lesion 03/23/2011  . UTI  (lower urinary tract infection) 08/14/2011  . Breast pain in female 11/17/2011  . Chest pain 11/17/2011  . Hematoma 12/18/2011  . Cervical cancer screening 03/22/2012  . Foot pain, left 03/22/2012  . Foot fracture, left 03/22/2012  . Tick bite of back 10/15/2012  . Preop examination 02/03/2013  . Dysrhythmia   . Hypertension   . Shortness of breath     with exertion   . Heart murmur   . Urinary tract bacterial infections   . Arthritis    Past Surgical History  Procedure Laterality Date  . Appendectomy      Required revision for EColi infection, required recurrent packing  . Bowel obstruction      Requiring adhesions to be removed  . Aortic valve replacement    . Open heart with a cardiac aneurysm repair    . Cholecystectomy    . Tubal ligation    . Varicose vein surgery b/l legs    . Childhood exploratory surgery of heart    . Hernia repair  08-16-10  . Abdominal hysterectomy      sb/l spo, total  . Total knee arthroplasty Right 02/24/2013    Procedure: RIGHT TOTAL KNEE ARTHROPLASTY;  Surgeon: Johnn Hai, MD;  Location: [facility];  Service: Orthopedics;  Laterality: Right;   Family History  Problem Relation Age of Onset  . Scoliosis Mother   . Arthritis Mother     Rheumatoid  . Aneurysm Mother  heart  . Alcohol abuse Father   . Depression Sister   . Depression Brother   . Alcohol abuse Brother   . Cancer Maternal Grandmother     colon  . Diabetes Maternal Grandfather   . Heart disease Maternal Grandfather   . Alcohol abuse Maternal Grandfather   . Depression Paternal Grandmother   . Diabetes Paternal Grandmother   . Depression Paternal Grandfather   . Diabetes Paternal Grandfather   . Depression Sister   . Alcohol abuse Brother   . Depression Brother   . Heart disease Brother    History  Substance Use Topics  . Smoking status: Former Smoker    Quit date: 05/26/1990  . Smokeless tobacco: Never Used  . Alcohol Use: 0.0 oz/week    0 drink(s) per week      Comment: rare   OB History    No data available     Review of Systems  Eyes: Negative for blurred vision and double vision.  All other systems reviewed and are negative.     Allergies  Sulfonamide derivatives  Home Medications   Prior to Admission medications   Medication Sig Start Date End Date Taking? Authorizing Provider  albuterol (PROVENTIL HFA;VENTOLIN HFA) 108 (90 BASE) MCG/ACT inhaler Inhale 2 puffs into the lungs every 6 (six) hours as needed for wheezing or shortness of breath. 10/14/13   Brunetta Jeans, PA-C  B Complex-C (B-COMPLEX WITH VITAMIN C) tablet Take 1 tablet by mouth daily.    Historical Provider, MD  chlorpheniramine-HYDROcodone (TUSSIONEX PENNKINETIC ER) 10-8 MG/5ML LQCR Take 5 mLs by mouth every 12 (twelve) hours as needed. 10/14/13   Brunetta Jeans, PA-C  Cholecalciferol (VITAMIN D) 2000 UNITS tablet Take 2,000 Units by mouth daily.    Historical Provider, MD  clonazePAM (KLONOPIN) 0.5 MG tablet Take 0.5 mg by mouth 2 (two) times daily. 05/15/11   Tammi Sou, MD  Docusate Calcium (STOOL SOFTENER PO) Take 4 capsules by mouth at bedtime.     Historical Provider, MD  fenofibrate 160 MG tablet Take 1 tablet (160 mg total) by mouth daily. 09/30/13   Belva Crome III, MD  furosemide (LASIX) 20 MG tablet TAKE 1 TABLET (20 MG TOTAL) BY MOUTH EVERY MORNING. 12/28/13   Mosie Lukes, MD  Krill Oil 300 MG CAPS Take 300 mg by mouth daily.    Historical Provider, MD  lamoTRIgine (LAMICTAL) 200 MG tablet Take 200 mg by mouth at bedtime.    Historical Provider, MD  metoprolol succinate (TOPROL-XL) 25 MG 24 hr tablet Take 1 tablet (25 mg total) by mouth every morning. 12/29/13   Mosie Lukes, MD  Multiple Vitamin (MULTIVITAMIN WITH MINERALS) TABS tablet Take 1 tablet by mouth daily.    Historical Provider, MD  oxyCODONE-acetaminophen (PERCOCET) 5-325 MG per tablet Take 1-2 tablets by mouth every 4 (four) hours as needed for pain. 02/24/13   Johnn Hai, MD   pantoprazole (PROTONIX) 40 MG tablet TAKE 1 TABLET (40 MG TOTAL) BY MOUTH DAILY. 12/28/13   Mosie Lukes, MD  pravastatin (PRAVACHOL) 40 MG tablet Take 1 tablet (40 mg total) by mouth at bedtime. 04/08/13   Belva Crome III, MD  Probiotic Product (ALIGN) 4 MG CAPS Take 1 capsule by mouth daily. 11/18/10   Mosie Lukes, MD  Propylene Glycol (SYSTANE BALANCE) 0.6 % SOLN Place 1 drop into both eyes at bedtime.     Historical Provider, MD  QUEtiapine (SEROQUEL) 200 MG tablet Take  400 mg by mouth at bedtime. 2- 200mg  tablets at bedtime    Historical Provider, MD  venlafaxine XR (EFFEXOR-XR) 150 MG 24 hr capsule Take 150 mg by mouth every morning.     Historical Provider, MD  vitamin C (ASCORBIC ACID) 500 MG tablet Take 500 mg by mouth daily.    Historical Provider, MD  warfarin (COUMADIN) 5 MG tablet TAKE 1/2 TABLET ON ALL DAYS EXCEPT 1 TABLET ON MONDAYS, WEDNESDAYS, FRIDAYS, AND SUNDAYS OR AS DIRECTED BY COUMADIN CLINIC 02/27/14   Belva Crome III, MD   BP 122/68 mmHg  Pulse 56  Temp(Src) 98.3 F (36.8 C) (Oral)  Resp 16  Ht 5\' 8"  (1.727 m)  Wt 212 lb (96.163 kg)  BMI 32.24 kg/m2  SpO2 96% Physical Exam  Constitutional: She appears well-developed and well-nourished. No distress.  HENT:  Head: Normocephalic and atraumatic.  Right Ear: External ear normal.  Left Ear: External ear normal.  Eyes: Conjunctivae are normal. Right eye exhibits no discharge. Left eye exhibits no discharge. No scleral icterus.  Neck: Neck supple. No tracheal deviation present.  Cardiovascular: Normal rate, regular rhythm and intact distal pulses.   Mechanical heart valve  Pulmonary/Chest: Effort normal and breath sounds normal. No stridor. No respiratory distress. She has no wheezes. She has no rales.  Abdominal: Soft. Bowel sounds are normal. She exhibits no distension. There is no tenderness. There is no rebound and no guarding.  Musculoskeletal: She exhibits no edema or tenderness.  Neurological: She is  alert. She has normal strength. No cranial nerve deficit (no facial droop, extraocular movements intact, no slurred speech) or sensory deficit. She exhibits normal muscle tone. She displays no seizure activity. Coordination normal.  Skin: Skin is warm and dry. No rash noted.  Psychiatric: She has a normal mood and affect.  Nursing note and vitals reviewed.   ED Course  Procedures (including critical care time) Labs Review Labs Reviewed  BASIC METABOLIC PANEL - Abnormal; Notable for the following:    GFR calc non Af Amer 67 (*)    GFR calc Af Amer 77 (*)    All other components within normal limits  PROTIME-INR - Abnormal; Notable for the following:    Prothrombin Time 27.1 (*)    INR 2.51 (*)    All other components within normal limits  CBC    Imaging Review Ct Head Wo Contrast  04/06/2014   CLINICAL DATA:  Fall injury yesterday, pt states that she fell backwards in the shower hitting the back of her head, pt c.o posterior head pain, pt states that she has been sleepy ever since hitting the back of her head, pt denies LOC  EXAM: CT HEAD WITHOUT CONTRAST  TECHNIQUE: Contiguous axial images were obtained from the base of the skull through the vertex without intravenous contrast.  COMPARISON:  08/05/2011  FINDINGS: There is moderate central and cortical atrophy. There is no intra or extra-axial fluid collection or mass lesion. The basilar cisterns and ventricles have a normal appearance. There is no CT evidence for acute infarction or hemorrhage. 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 The appearance is chronic. Bone windows are unremarkable.  IMPRESSION: 1. Atrophy. 2.  No evidence for acute intracranial abnormality.   Electronically Signed   By: Shon Hale M.D.   On: 04/06/2014 17:02     MDM   Final diagnoses:  Concussion, without loss of consciousness, initial encounter   Symptoms may be related to a mild concussion.  Labs unremarkable.  No serious injury noted on CT  scan  At this time there does not appear to be any evidence of an acute emergency medical condition and the patient appears stable for discharge with appropriate outpatient follow up.     Dorie Rank, MD 04/06/14 [phone]

## 2014-04-06 NOTE — ED Notes (Signed)
Pt c/o head injury x 1 day ago  NO LOC

## 2014-04-06 NOTE — Discharge Instructions (Signed)
Concussion A concussion is a brain injury. It is caused by:  A hit to the head.  A quick and sudden movement (jolt) of the head or neck. A concussion is usually not life threatening. Even so, it can cause serious problems. If you had a concussion before, you may have concussion-like problems after a hit to your head. HOME CARE General Instructions  Follow your doctor's directions carefully.  Take medicines only as told by your doctor.  Only take medicines your doctor says are safe.  Do not drink alcohol until your doctor says it is okay. Alcohol and some drugs can slow down healing. They can also put you at risk for further injury.  If you are having trouble remembering things, write them down.  Try to do one thing at a time if you get distracted easily. For example, do not watch TV while making dinner.  Talk to your family members or close friends when making important decisions.  Follow up with your doctor as told.  Watch your symptoms. Tell others to do the same. Serious problems can sometimes happen after a concussion. Older adults are more likely to have these problems.  Tell your teachers, school nurse, school counselor, coach, Product/process development scientist, or work Freight forwarder about your concussion. Tell them about what you can or cannot do. They should watch to see if:  It gets even harder for you to pay attention or concentrate.  It gets even harder for you to remember things or learn new things.  You need more time than normal to finish things.  You become annoyed (irritable) more than before.  You are not able to deal with stress as well.  You have more problems than before.  Rest. Make sure you:  Get plenty of sleep at night.  Go to sleep early.  Go to bed at the same time every day. Try to wake up at the same time.  Rest during the day.  Take naps when you feel tired.  Limit activities where you have to think a lot or concentrate. These include:  Doing  homework.  Doing work related to a job.  Watching TV.  Using the computer. Returning To Your Regular Activities Return to your normal activities slowly, not all at once. You must give your body and brain enough time to heal.   Do not play sports or do other athletic activities until your doctor says it is okay.  Ask your doctor when you can drive, ride a bicycle, or work other vehicles or machines. Never do these things if you feel dizzy.  Ask your doctor about when you can return to work or school. Preventing Another Concussion It is very important to avoid another brain injury, especially before you have healed. In rare cases, another injury can lead to permanent brain damage, brain swelling, or death. The risk of this is greatest during the first 7-10 days after your injury. Avoid injuries by:   Wearing a seat belt when riding in a car.  Not drinking too much alcohol.  Avoiding activities that could lead to a second concussion (such as contact sports).  Wearing a helmet when doing activities like:  Biking.  Skiing.  Skateboarding.  Skating.  Making your home safer by:  Removing things from the floor or stairways that could make you trip.  Using grab bars in bathrooms and handrails by stairs.  Placing non-slip mats on floors and in bathtubs.  Improve lighting in dark areas. GET HELP IF:  It  gets even harder for you to pay attention or concentrate.  It gets even harder for you to remember things or learn new things.  You need more time than normal to finish things.  You become annoyed (irritable) more than before.  You are not able to deal with stress as well.  You have more problems than before.  You have problems keeping your balance.  You are not able to react quickly when you should. Get help if you have any of these problems for more than 2 weeks:   Lasting (chronic) headaches.  Dizziness or trouble balancing.  Feeling sick to your stomach  (nausea).  Seeing (vision) problems.  Being affected by noises or light more than normal.  Feeling sad, low, down in the dumps, blue, gloomy, or empty (depressed).  Mood changes (mood swings).  Feeling of fear or nervousness about what may happen (anxiety).  Feeling annoyed.  Memory problems.  Problems concentrating or paying attention.  Sleep problems.  Feeling tired all the time. GET HELP RIGHT AWAY IF:   You have bad headaches or your headaches get worse.  You have weakness (even if it is in one hand, leg, or part of the face).  You have loss of feeling (numbness).  You feel off balance.  You keep throwing up (vomiting).  You feel tired.  One black center of your eye (pupil) is larger than the other.  You twitch or shake violently (convulse).  Your speech is not clear (slurred).  You are more confused, easily angered (agitated), or annoyed than before.  You have more trouble resting than before.  You are unable to recognize people or places.  You have neck pain.  It is difficult to wake you up.  You have unusual behavior changes.  You pass out (lose consciousness). MAKE SURE YOU:   Understand these instructions.  Will watch your condition.  Will get help right away if you are not doing well or get worse. Document Released: 04/30/2009 Document Revised: 09/26/2013 Document Reviewed: 12/02/2012 [facility] Patient Information 2015 [place], [place]. This information is not intended to replace advice given to you by your health care provider. Make sure you discuss any questions you have with your health care provider.

## 2014-04-11 ENCOUNTER — Telehealth: Payer: Self-pay

## 2014-04-11 NOTE — Telephone Encounter (Signed)
Pt [facility].  Pt was seen in the ED on 04/06/14 for concussion without loss of consciousness.  An INR was done during that visit.  INR result:  2.51.  Pt's INR goal is 2.5-3.5.  Pt states that she is taking Coumadin 1 tablet every day except 1/2 tablet on Tuesday and Saturday.  According to patient's chart she was suppose to be taking 1 tablet every day except 1/2 tablet on Saturdays.  Therefore, given patient's result, she was encouraged to take Coumadin 1 tablet every day except 1/2 on Saturdays and repeat INR in 4 weeks.  Next coumadin clinic appointment scheduled for 05/03/14 @ 11:45 am.

## 2014-05-03 ENCOUNTER — Encounter: Payer: Self-pay | Admitting: Physician Assistant

## 2014-05-03 ENCOUNTER — Ambulatory Visit (INDEPENDENT_AMBULATORY_CARE_PROVIDER_SITE_OTHER): Payer: BC Managed Care – PPO | Admitting: Physician Assistant

## 2014-05-03 ENCOUNTER — Telehealth: Payer: Self-pay | Admitting: Family

## 2014-05-03 ENCOUNTER — Ambulatory Visit (INDEPENDENT_AMBULATORY_CARE_PROVIDER_SITE_OTHER): Payer: BC Managed Care – PPO

## 2014-05-03 VITALS — BP 110/68 | HR 60 | Temp 97.9°F | Wt 223.0 lb

## 2014-05-03 DIAGNOSIS — I480 Paroxysmal atrial fibrillation: Secondary | ICD-10-CM

## 2014-05-03 DIAGNOSIS — Z5181 Encounter for therapeutic drug level monitoring: Secondary | ICD-10-CM

## 2014-05-03 DIAGNOSIS — J209 Acute bronchitis, unspecified: Secondary | ICD-10-CM

## 2014-05-03 LAB — POCT INR: INR: 1.8

## 2014-05-03 MED ORDER — FLUTICASONE PROPIONATE 50 MCG/ACT NA SUSP: 2.0000 | Freq: Every day | NASAL | Status: DC

## 2014-05-03 MED ORDER — HYDROCOD POLST-CHLORPHEN POLST 10-8 MG/5ML PO LQCR: 5.0000 mL | Freq: Two times a day (BID) | ORAL | Status: DC | PRN

## 2014-05-03 MED ORDER — AMOXICILLIN 875 MG PO TABS: 875.0000 mg | ORAL_TABLET | Freq: Two times a day (BID) | ORAL | Status: DC

## 2014-05-03 NOTE — Assessment & Plan Note (Signed)
Rx Tussionex for cough.  Use Plain Mucinex.  Increase fluids.  Rest. Flonase given to help with fluid behind TM.  Rx Amoxicillin given to fill and take only if symptoms are not improving within 48 hours.  If needing ABX, she has been informed to call her coumadin clinic to see when to come in for INR recheck.  Continue medications as directed.

## 2014-05-03 NOTE — Patient Instructions (Signed)
Please increase your fluid intake.  Use Mucinex to break up chest congestion.  Use Flonase daily as directed for fluid behind ears and use the Tussionex syrup for cough.  If symptoms are worsening over the next 48 hours, fill the antibiotic and begin taking as directed. If you have to take this medication, call you Coumadin clinic to let them know, as they may want to check your INR sooner.  Call or return to clinic if symptoms are not improving.

## 2014-05-03 NOTE — Telephone Encounter (Signed)
Agree with plan 

## 2014-05-03 NOTE — Progress Notes (Signed)
Pre visit review using our clinic review tool, if applicable. No additional management support is needed unless otherwise documented below in the visit note. 

## 2014-05-03 NOTE — Progress Notes (Signed)
Patient presents to clinic today c/o sore throat, productive cough and chest congestion x 3 days. Denies fever, chills.  Denies shortness of breath or chest pain.  Patient is not a smoker. Is not asthmatic. Patient endorses her husband and daughter have the same symptoms.  Is up-to-date on the flu shot.  Past Medical History  Diagnosis Date  . Anemia   . Anxiety   . Depression   . Diverticulosis   . Elevated cholesterol   . Obesity   . THYROMEGALY 07/22/2010  . Overweight(278.02) 03/11/2010  . Mixed hyperlipidemia 03/11/2010  . MEASLES, HX OF 03/11/2010  . KNEE PAIN, RIGHT 05/28/2010  . HYPERTRIGLYCERIDEMIA 03/11/2010  . HEART MURMUR, HX OF 03/11/2010  . FIBROIDS, UTERUS 03/11/2010  . Diarrhea 08/05/2010  . CONSTIPATION 03/11/2010  . CHICKENPOX, HX OF 03/11/2010  . BIPOLAR AFFECTIVE DISORDER 03/11/2010  . AORTIC VALVE REPLACEMENT, HX OF 03/11/2010  . ANEMIA 05/28/2010  . Acute bronchitis 07/22/2010  . Abdominal pain, generalized 03/19/2010  . Peripheral edema 11/18/2010  . Bacterial vaginitis 11/18/2010  . Hematuria 12/18/2010  . Diverticulitis 12/18/2010  . Hyperthyroidism 12/18/2010  . Fatigue 12/30/2010  . Renal insufficiency 12/30/2010  . Grave's disease 8-12  . Arm lesion 03/23/2011  . UTI (lower urinary tract infection) 08/14/2011  . Breast pain in female 11/17/2011  . Chest pain 11/17/2011  . Hematoma 12/18/2011  . Cervical cancer screening 03/22/2012  . Foot pain, left 03/22/2012  . Foot fracture, left 03/22/2012  . Tick bite of back 10/15/2012  . Preop examination 02/03/2013  . Dysrhythmia   . Hypertension   . Shortness of breath     with exertion   . Heart murmur   . Urinary tract bacterial infections   . Arthritis     Current Outpatient Prescriptions on File Prior to Visit  Medication Sig Dispense Refill  . albuterol (PROVENTIL HFA;VENTOLIN HFA) 108 (90 BASE) MCG/ACT inhaler Inhale 2 puffs into the lungs every 6 (six) hours as needed for wheezing or shortness of  breath. 1 Inhaler 0  . B Complex-C (B-COMPLEX WITH VITAMIN C) tablet Take 1 tablet by mouth daily.    . Cholecalciferol (VITAMIN D) 2000 UNITS tablet Take 2,000 Units by mouth daily.    . fenofibrate 160 MG tablet Take 1 tablet (160 mg total) by mouth daily. 30 tablet 6  . furosemide (LASIX) 20 MG tablet TAKE 1 TABLET (20 MG TOTAL) BY MOUTH EVERY MORNING. 30 tablet 1  . Krill Oil 300 MG CAPS Take 300 mg by mouth daily.    Marland Kitchen lamoTRIgine (LAMICTAL) 200 MG tablet Take 200 mg by mouth at bedtime.    . metoprolol succinate (TOPROL-XL) 25 MG 24 hr tablet Take 1 tablet (25 mg total) by mouth every morning. 30 tablet 5  . Multiple Vitamin (MULTIVITAMIN WITH MINERALS) TABS tablet Take 1 tablet by mouth daily.    . pantoprazole (PROTONIX) 40 MG tablet TAKE 1 TABLET (40 MG TOTAL) BY MOUTH DAILY. 30 tablet 1  . pravastatin (PRAVACHOL) 40 MG tablet Take 1 tablet (40 mg total) by mouth at bedtime. 30 tablet 11  . Probiotic Product (ALIGN) 4 MG CAPS Take 1 capsule by mouth daily. 30 capsule 0  . Propylene Glycol (SYSTANE BALANCE) 0.6 % SOLN Place 1 drop into both eyes at bedtime.     Marland Kitchen QUEtiapine (SEROQUEL) 200 MG tablet Take 400 mg by mouth at bedtime. 2- $RemoveBef'200mg'IwhcnHIJBs$  tablets at bedtime    . venlafaxine XR (EFFEXOR-XR) 150 MG 24 hr capsule Take  150 mg by mouth every morning.     . vitamin C (ASCORBIC ACID) 500 MG tablet Take 500 mg by mouth daily.    Marland Kitchen warfarin (COUMADIN) 5 MG tablet TAKE 1/2 TABLET ON ALL DAYS EXCEPT 1 TABLET ON MONDAYS, WEDNESDAYS, FRIDAYS, AND SUNDAYS OR AS DIRECTED BY COUMADIN CLINIC 30 tablet 2  . [DISCONTINUED] methimazole (TAPAZOLE) 5 MG tablet Take 1 tablet (5 mg total) by mouth 3 (three) times daily. 30 tablet 2   No current facility-administered medications on file prior to visit.    Allergies  Allergen Reactions  . Sulfonamide Derivatives     headaches    Family History  Problem Relation Age of Onset  . Scoliosis Mother   . Arthritis Mother     Rheumatoid  . Aneurysm Mother       heart  . Alcohol abuse Father   . Depression Sister   . Depression Brother   . Alcohol abuse Brother   . Cancer Maternal Grandmother     colon  . Diabetes Maternal Grandfather   . Heart disease Maternal Grandfather   . Alcohol abuse Maternal Grandfather   . Depression Paternal Grandmother   . Diabetes Paternal Grandmother   . Depression Paternal Grandfather   . Diabetes Paternal Grandfather   . Depression Sister   . Alcohol abuse Brother   . Depression Brother   . Heart disease Brother     History   Social History  . Marital Status: Married    Spouse Name: N/A    Number of Children: N/A  . Years of Education: N/A   Social History Main Topics  . Smoking status: Former Smoker    Quit date: 05/26/1990  . Smokeless tobacco: Never Used  . Alcohol Use: 0.0 oz/week    0 drink(s) per week     Comment: rare  . Drug Use: No  . Sexual Activity: No   Other Topics Concern  . None   Social History Narrative   Review of Systems - See HPI.  All other ROS are negative.  BP 110/68 mmHg  Pulse 60  Temp(Src) 97.9 F (36.6 C)  Wt 223 lb (101.152 kg)  SpO2 97%  Physical Exam  Constitutional: She is oriented to person, place, and time and well-developed, well-nourished, and in no distress.  HENT:  Head: Normocephalic and atraumatic.  Right Ear: External ear normal.  Left Ear: External ear normal.  Nose: Nose normal.  Mouth/Throat: Oropharynx is clear and moist. No oropharyngeal exudate.  TM with scant fluid noted bilaterally.  Eyes: Conjunctivae are normal.  Cardiovascular: Normal rate, regular rhythm, normal heart sounds and intact distal pulses.   Pulmonary/Chest: Effort normal and breath sounds normal. No respiratory distress. She has no wheezes. She has no rales. She exhibits no tenderness.  Lymphadenopathy:    She has no cervical adenopathy.  Neurological: She is alert and oriented to person, place, and time.  Skin: Skin is warm and dry. No rash noted.   Psychiatric: Affect normal.  Vitals reviewed.   Recent Results (from the past 2160 hour(s))  POCT INR     Status: None   Collection Time: 02/21/14  9:51 AM  Result Value Ref Range   INR 5.6   Renal Function Panel     Status: None   Collection Time: 02/24/14 10:37 AM  Result Value Ref Range   Sodium 142 135 - 145 mEq/L   Potassium 4.3 3.5 - 5.1 mEq/L   Chloride 104 96 - 112 mEq/L  CO2 28 19 - 32 mEq/L   Calcium 10.0 8.4 - 10.5 mg/dL   Albumin 4.8 3.5 - 5.2 g/dL   BUN 11 6 - 23 mg/dL   Creatinine, Ser 0.9 0.4 - 1.2 mg/dL   Glucose, Bld 95 70 - 99 mg/dL   Phosphorus 3.3 2.3 - 4.6 mg/dL   GFR 65.48 >60.00 mL/min  Hepatic function panel     Status: Abnormal   Collection Time: 02/24/14 10:37 AM  Result Value Ref Range   Total Bilirubin 0.9 0.2 - 1.2 mg/dL   Bilirubin, Direct 0.1 0.0 - 0.3 mg/dL   Alkaline Phosphatase 64 39 - 117 U/L   AST 45 (H) 0 - 37 U/L   ALT 32 0 - 35 U/L   Total Protein 8.5 (H) 6.0 - 8.3 g/dL   Albumin 4.8 3.5 - 5.2 g/dL  CBC     Status: None   Collection Time: 02/24/14 10:37 AM  Result Value Ref Range   WBC 6.5 4.0 - 10.5 K/uL   RBC 4.59 3.87 - 5.11 Mil/uL   Platelets 219.0 150.0 - 400.0 K/uL   Hemoglobin 14.1 12.0 - 15.0 g/dL   HCT 41.8 36.0 - 46.0 %   MCV 91.2 78.0 - 100.0 fl   MCHC 33.6 30.0 - 36.0 g/dL   RDW 13.8 11.5 - 15.5 %  TSH     Status: None   Collection Time: 02/24/14 10:37 AM  Result Value Ref Range   TSH 1.65 0.35 - 4.50 uIU/mL  Lipid Profile     Status: Abnormal   Collection Time: 02/24/14 10:37 AM  Result Value Ref Range   Cholesterol 211 (H) 0 - 200 mg/dL    Comment: ATP III Classification       Desirable:  < 200 mg/dL               Borderline High:  200 - 239 mg/dL          High:  > = 240 mg/dL   Triglycerides 253.0 (H) 0.0 - 149.0 mg/dL    Comment: Normal:  <150 mg/dLBorderline High:  150 - 199 mg/dL   HDL 51.30 >39.00 mg/dL   VLDL 50.6 (H) 0.0 - 40.0 mg/dL   Total CHOL/HDL Ratio 4     Comment:                Men           Women1/2 Average Risk     3.4          3.3Average Risk          5.0          4.42X Average Risk          9.6          7.13X Average Risk          15.0          11.0                       NonHDL 159.70     Comment: NOTE:  Non-HDL goal should be 30 mg/dL higher than patient's LDL goal (i.e. LDL goal of < 70 mg/dL, would have non-HDL goal of < 100 mg/dL)  Vitamin B12     Status: None   Collection Time: 02/24/14 10:37 AM  Result Value Ref Range   Vitamin B-12 645 211 - 911 pg/mL  LDL cholesterol, direct     Status: None   Collection  Time: 02/24/14 10:37 AM  Result Value Ref Range   Direct LDL 118.6 mg/dL    Comment: Optimal:  <100 mg/dLNear or Above Optimal:  100-129 mg/dLBorderline High:  130-159 mg/dLHigh:  160-189 mg/dLVery High:  >190 mg/dL  POCT INR     Status: None   Collection Time: 03/07/14  2:02 PM  Result Value Ref Range   INR 2.9   CBC     Status: None   Collection Time: 04/06/14  6:45 PM  Result Value Ref Range   WBC 5.4 4.0 - 10.5 K/uL   RBC 4.04 3.87 - 5.11 MIL/uL   Hemoglobin 12.6 12.0 - 15.0 g/dL   HCT 37.2 36.0 - 46.0 %   MCV 92.1 78.0 - 100.0 fL   MCH 31.2 26.0 - 34.0 pg   MCHC 33.9 30.0 - 36.0 g/dL   RDW 13.8 11.5 - 15.5 %   Platelets 214 150 - 400 K/uL  Basic metabolic panel     Status: Abnormal   Collection Time: 04/06/14  6:45 PM  Result Value Ref Range   Sodium 141 137 - 147 mEq/L   Potassium 4.3 3.7 - 5.3 mEq/L   Chloride 100 96 - 112 mEq/L   CO2 30 19 - 32 mEq/L   Glucose, Bld 92 70 - 99 mg/dL   BUN 16 6 - 23 mg/dL   Creatinine, Ser 0.90 0.50 - 1.10 mg/dL   Calcium 9.4 8.4 - 10.5 mg/dL   GFR calc non Af Amer 67 (L) >90 mL/min   GFR calc Af Amer 77 (L) >90 mL/min    Comment: (NOTE) The eGFR has been calculated using the CKD EPI equation. This calculation has not been validated in all clinical situations. eGFR's persistently <90 mL/min signify possible Chronic Kidney Disease.    Anion gap 11 5 - 15  Protime-INR     Status: Abnormal   Collection  Time: 04/06/14  6:45 PM  Result Value Ref Range   Prothrombin Time 27.1 (H) 11.6 - 15.2 seconds   INR 2.51 (H) 0.00 - 1.49    Assessment/Plan: Acute bronchitis Rx Tussionex for cough.  Use Plain Mucinex.  Increase fluids.  Rest. Flonase given to help with fluid behind TM.  Rx Amoxicillin given to fill and take only if symptoms are not improving within 48 hours.  If needing ABX, she has been informed to call her coumadin clinic to see when to come in for INR recheck.  Continue medications as directed.

## 2014-05-04 ENCOUNTER — Encounter (HOSPITAL_COMMUNITY): Payer: Self-pay | Admitting: Interventional Cardiology

## 2014-05-10 ENCOUNTER — Other Ambulatory Visit: Payer: Self-pay | Admitting: *Deleted

## 2014-05-10 MED ORDER — PRAVASTATIN SODIUM 40 MG PO TABS: 40.0000 mg | ORAL_TABLET | Freq: Every day | ORAL | Status: DC

## 2014-05-16 ENCOUNTER — Other Ambulatory Visit: Payer: Self-pay | Admitting: Family Medicine

## 2014-05-17 NOTE — Telephone Encounter (Signed)
Med filled.  Next appointment: 07/28/14.

## 2014-05-24 ENCOUNTER — Ambulatory Visit (INDEPENDENT_AMBULATORY_CARE_PROVIDER_SITE_OTHER): Payer: BC Managed Care – PPO | Admitting: Family Medicine

## 2014-05-24 DIAGNOSIS — Z5181 Encounter for therapeutic drug level monitoring: Secondary | ICD-10-CM

## 2014-05-24 DIAGNOSIS — I480 Paroxysmal atrial fibrillation: Secondary | ICD-10-CM

## 2014-05-24 LAB — POCT INR: INR: 4

## 2014-05-29 ENCOUNTER — Ambulatory Visit: Payer: BC Managed Care – PPO | Admitting: Interventional Cardiology

## 2014-05-31 ENCOUNTER — Other Ambulatory Visit: Payer: Self-pay | Admitting: Interventional Cardiology

## 2014-06-02 ENCOUNTER — Encounter: Payer: Self-pay | Admitting: Interventional Cardiology

## 2014-06-02 ENCOUNTER — Ambulatory Visit (INDEPENDENT_AMBULATORY_CARE_PROVIDER_SITE_OTHER): Payer: 59 | Admitting: Interventional Cardiology

## 2014-06-02 VITALS — BP 130/70 | HR 72 | Ht 68.0 in | Wt 221.0 lb

## 2014-06-02 DIAGNOSIS — Z7901 Long term (current) use of anticoagulants: Secondary | ICD-10-CM

## 2014-06-02 DIAGNOSIS — I480 Paroxysmal atrial fibrillation: Secondary | ICD-10-CM

## 2014-06-02 DIAGNOSIS — Z954 Presence of other heart-valve replacement: Secondary | ICD-10-CM

## 2014-06-02 DIAGNOSIS — Z5181 Encounter for therapeutic drug level monitoring: Secondary | ICD-10-CM

## 2014-06-02 DIAGNOSIS — Z953 Presence of xenogenic heart valve: Secondary | ICD-10-CM

## 2014-06-02 MED ORDER — PRAVASTATIN SODIUM 40 MG PO TABS: 40.0000 mg | ORAL_TABLET | Freq: Every day | ORAL | Status: DC

## 2014-06-02 NOTE — Progress Notes (Signed)
Patient ID: [name], female   DOB: Oct 06, 1950, 64 y.o.   MRN: [id number]    [address], [place]  [address] Phone: [phone] Fax:  [phone]  Date:  06/02/2014   ID:  [name], DOB 06-30-1950, MRN [id number]  PCP:  Penni Homans, MD   ASSESSMENT:  1. Mechanical aortic valve, functioning normally 2. History of atrial fibrillation without recent recurrences 3. Chronic anticoagulation therapy  PLAN:  1. Clinical follow-up in one year 2. No change in therapy   SUBJECTIVE: [name] is a 64 y.o. female who is doing well. No bleeding, occasions. She denies orthopnea, dyspnea, PND, and edema. She denies chest pain. No transient neurological complaints. No prolonged palpitations.   Wt Readings from Last 3 Encounters:  06/02/14 221 lb (100.245 kg)  05/03/14 223 lb (101.152 kg)  04/06/14 212 lb (96.163 kg)     Past Medical History  Diagnosis Date  . Anemia   . Anxiety   . Depression   . Diverticulosis   . Elevated cholesterol   . Obesity   . THYROMEGALY 07/22/2010  . Overweight(278.02) 03/11/2010  . Mixed hyperlipidemia 03/11/2010  . MEASLES, HX OF 03/11/2010  . KNEE PAIN, RIGHT 05/28/2010  . HYPERTRIGLYCERIDEMIA 03/11/2010  . HEART MURMUR, HX OF 03/11/2010  . FIBROIDS, UTERUS 03/11/2010  . Diarrhea 08/05/2010  . CONSTIPATION 03/11/2010  . CHICKENPOX, HX OF 03/11/2010  . BIPOLAR AFFECTIVE DISORDER 03/11/2010  . AORTIC VALVE REPLACEMENT, HX OF 03/11/2010  . ANEMIA 05/28/2010  . Acute bronchitis 07/22/2010  . Abdominal pain, generalized 03/19/2010  . Peripheral edema 11/18/2010  . Bacterial vaginitis 11/18/2010  . Hematuria 12/18/2010  . Diverticulitis 12/18/2010  . Hyperthyroidism 12/18/2010  . Fatigue 12/30/2010  . Renal insufficiency 12/30/2010  . Grave's disease 8-12  . Arm lesion 03/23/2011  . UTI (lower urinary tract infection) 08/14/2011  . Breast pain in female 11/17/2011  . Chest pain 11/17/2011  . Hematoma 12/18/2011  . Cervical  cancer screening 03/22/2012  . Foot pain, left 03/22/2012  . Foot fracture, left 03/22/2012  . Tick bite of back 10/15/2012  . Preop examination 02/03/2013  . Dysrhythmia   . Hypertension   . Shortness of breath     with exertion   . Heart murmur   . Urinary tract bacterial infections   . Arthritis     Current Outpatient Prescriptions  Medication Sig Dispense Refill  . B Complex-C (B-COMPLEX WITH VITAMIN C) tablet Take 1 tablet by mouth daily.    . Cholecalciferol (VITAMIN D) 2000 UNITS tablet Take 2,000 Units by mouth daily.    . fenofibrate 160 MG tablet Take 1 tablet (160 mg total) by mouth daily. 30 tablet 6  . fluticasone (FLONASE) 50 MCG/ACT nasal spray Place 2 sprays into both nostrils daily. 16 g 6  . furosemide (LASIX) 20 MG tablet TAKE 1 TABLET (20 MG TOTAL) BY MOUTH EVERY MORNING. 90 tablet 0  . Krill Oil 300 MG CAPS Take 300 mg by mouth daily.    Marland Kitchen lamoTRIgine (LAMICTAL) 200 MG tablet Take 200 mg by mouth at bedtime.    . metoprolol succinate (TOPROL-XL) 25 MG 24 hr tablet Take 1 tablet (25 mg total) by mouth every morning. 30 tablet 5  . Multiple Vitamin (MULTIVITAMIN WITH MINERALS) TABS tablet Take 1 tablet by mouth daily.    . pantoprazole (PROTONIX) 40 MG tablet TAKE 1 TABLET (40 MG TOTAL) BY MOUTH DAILY. 30 tablet 1  . pravastatin (PRAVACHOL)  40 MG tablet Take 1 tablet (40 mg total) by mouth at bedtime. 30 tablet 0  . Probiotic Product (ALIGN) 4 MG CAPS Take 1 capsule by mouth daily. 30 capsule 0  . Propylene Glycol (SYSTANE BALANCE) 0.6 % SOLN Place 1 drop into both eyes at bedtime.     Marland Kitchen QUEtiapine (SEROQUEL) 200 MG tablet Take 400 mg by mouth at bedtime. 2- 200mg  tablets at bedtime    . venlafaxine XR (EFFEXOR-XR) 150 MG 24 hr capsule Take 150 mg by mouth every morning.     . vitamin C (ASCORBIC ACID) 500 MG tablet Take 500 mg by mouth daily.    Marland Kitchen warfarin (COUMADIN) 5 MG tablet TAKE 1/2 TABLET ON ALL DAYS EXCEPT 1 TABLET ON MONDAYS, WEDNESDAYS, FRIDAYS, AND SUNDAYS  OR AS DIRECTED BY COUMADIN CLINIC 24 tablet 2  . albuterol (PROVENTIL HFA;VENTOLIN HFA) 108 (90 BASE) MCG/ACT inhaler Inhale 2 puffs into the lungs every 6 (six) hours as needed for wheezing or shortness of breath. (Patient not taking: Reported on 06/02/2014) 1 Inhaler 0  . [DISCONTINUED] methimazole (TAPAZOLE) 5 MG tablet Take 1 tablet (5 mg total) by mouth 3 (three) times daily. 30 tablet 2   No current facility-administered medications for this visit.    Allergies:    Allergies  Allergen Reactions  . Sulfonamide Derivatives     headaches    Social History:  The patient  reports that she quit smoking about 24 years ago. She has never used smokeless tobacco. She reports that she drinks alcohol. She reports that she does not use illicit drugs.   ROS:  Please see the history of present illness.   No blood in urine or stool. No transient neurological complaints. Denies dyspnea and exertional intolerance. No chills or fever. Understands endocarditis prophylaxis   All other systems reviewed and negative.   OBJECTIVE: VS:  BP 130/70 mmHg  Pulse 72  Ht 5\' 8"  (1.727 m)  Wt 221 lb (100.245 kg)  BMI 33.61 kg/m2 Well nourished, well developed, in no acute distress, looks younger than stated age. Accompanied by her husband. HEENT: normal Neck: JVD flat. Carotid bruit absent  Cardiac:  normal S1, S2; RRR; no murmur. Crisp mechanical valve closure sounds Lungs:  clear to auscultation bilaterally, no wheezing, rhonchi or rales Abd: soft, nontender, no hepatomegaly Ext: Edema . Absent. Pulses 2+ and symmetric Skin: warm and dry Neuro:  CNs 2-12 intact, no focal abnormalities noted  EKG:     sinus rhythm with normal PR interval nonspecific T-wave abnormality.     Signed, Illene Labrador III, MD 06/02/2014 10:20 AM

## 2014-06-02 NOTE — Patient Instructions (Signed)
Your physician recommends that you continue on your current medications as directed. Please refer to the Current Medication list given to you today.  Your physician wants you to follow-up in: 1 year with Dr.Smith You will receive a reminder letter in the mail two months in advance. If you don't receive a letter, please call our office to schedule the follow-up appointment.  

## 2014-06-20 ENCOUNTER — Ambulatory Visit: Payer: 59

## 2014-06-21 ENCOUNTER — Other Ambulatory Visit: Payer: Self-pay | Admitting: Family Medicine

## 2014-06-21 ENCOUNTER — Ambulatory Visit: Payer: BC Managed Care – PPO

## 2014-06-21 ENCOUNTER — Ambulatory Visit: Payer: 59

## 2014-06-21 DIAGNOSIS — Z952 Presence of prosthetic heart valve: Secondary | ICD-10-CM

## 2014-06-21 DIAGNOSIS — Z5181 Encounter for therapeutic drug level monitoring: Secondary | ICD-10-CM

## 2014-06-21 LAB — POCT INR: INR: 3.7

## 2014-06-21 NOTE — Telephone Encounter (Signed)
Rx request to pharmacy/[facility] Requested drug refills are authorized, however, the patient needs further evaluation and/or laboratory testing before further refills are given. Ask her to make an appointment for this.  

## 2014-06-27 ENCOUNTER — Encounter: Payer: Self-pay | Admitting: Family Medicine

## 2014-06-27 ENCOUNTER — Ambulatory Visit (INDEPENDENT_AMBULATORY_CARE_PROVIDER_SITE_OTHER): Payer: 59 | Admitting: Family Medicine

## 2014-06-27 VITALS — BP 144/79 | HR 58 | Temp 98.3°F | Ht 68.0 in | Wt 231.2 lb

## 2014-06-27 DIAGNOSIS — R509 Fever, unspecified: Secondary | ICD-10-CM

## 2014-06-27 DIAGNOSIS — E782 Mixed hyperlipidemia: Secondary | ICD-10-CM

## 2014-06-27 DIAGNOSIS — F32A Depression, unspecified: Secondary | ICD-10-CM

## 2014-06-27 DIAGNOSIS — E663 Overweight: Secondary | ICD-10-CM

## 2014-06-27 DIAGNOSIS — Z7901 Long term (current) use of anticoagulants: Secondary | ICD-10-CM

## 2014-06-27 DIAGNOSIS — E0590 Thyrotoxicosis, unspecified without thyrotoxic crisis or storm: Secondary | ICD-10-CM

## 2014-06-27 DIAGNOSIS — K5900 Constipation, unspecified: Secondary | ICD-10-CM

## 2014-06-27 DIAGNOSIS — I10 Essential (primary) hypertension: Secondary | ICD-10-CM

## 2014-06-27 DIAGNOSIS — R5381 Other malaise: Secondary | ICD-10-CM

## 2014-06-27 DIAGNOSIS — I4891 Unspecified atrial fibrillation: Secondary | ICD-10-CM

## 2014-06-27 DIAGNOSIS — R2230 Localized swelling, mass and lump, unspecified upper limb: Secondary | ICD-10-CM

## 2014-06-27 DIAGNOSIS — M542 Cervicalgia: Secondary | ICD-10-CM

## 2014-06-27 DIAGNOSIS — Z5181 Encounter for therapeutic drug level monitoring: Secondary | ICD-10-CM

## 2014-06-27 DIAGNOSIS — N390 Urinary tract infection, site not specified: Secondary | ICD-10-CM

## 2014-06-27 DIAGNOSIS — F329 Major depressive disorder, single episode, unspecified: Secondary | ICD-10-CM

## 2014-06-27 NOTE — Patient Instructions (Signed)

## 2014-06-27 NOTE — Assessment & Plan Note (Signed)
Encouraged DASH diet, decrease po intake and increase exercise as tolerated. Needs 7-8 hours of sleep nightly. Avoid trans fats, eat small, frequent meals every 4-5 hours with lean proteins, complex carbs and healthy fats. Minimize simple carbs, 

## 2014-06-27 NOTE — Progress Notes (Signed)
[name]  [id number] 1951-03-01 06/27/2014      Progress Note-Follow Up  Subjective  Chief Complaint  Chief Complaint  Patient presents with  . Dizziness    x 3 days  . Hot Flashes    getting worse    HPI  Patient is a 64 y.o. female in today for routine medical care. Patient in for evaluation of malaise. She is tearful in the visit and acknowledges significant stressors with her husband and mother but also says she just feels poorly. She has numerous episodes each day of feeling lightheaded for just an instant and then it resolves. She has some intermittent fevers and myalgias. Has ongoing abdominal pains but they have not escalated. Denies CP/palp/SOB/HA/congestion/fevers/GI or GU c/o. Taking meds as prescribed  Past Medical History  Diagnosis Date  . Anemia   . Anxiety   . Depression   . Diverticulosis   . Elevated cholesterol   . Obesity   . THYROMEGALY 07/22/2010  . Overweight(278.02) 03/11/2010  . Mixed hyperlipidemia 03/11/2010  . MEASLES, HX OF 03/11/2010  . KNEE PAIN, RIGHT 05/28/2010  . HYPERTRIGLYCERIDEMIA 03/11/2010  . HEART MURMUR, HX OF 03/11/2010  . FIBROIDS, UTERUS 03/11/2010  . Diarrhea 08/05/2010  . CONSTIPATION 03/11/2010  . CHICKENPOX, HX OF 03/11/2010  . BIPOLAR AFFECTIVE DISORDER 03/11/2010  . AORTIC VALVE REPLACEMENT, HX OF 03/11/2010  . ANEMIA 05/28/2010  . Acute bronchitis 07/22/2010  . Abdominal pain, generalized 03/19/2010  . Peripheral edema 11/18/2010  . Bacterial vaginitis 11/18/2010  . Hematuria 12/18/2010  . Diverticulitis 12/18/2010  . Hyperthyroidism 12/18/2010  . Fatigue 12/30/2010  . Renal insufficiency 12/30/2010  . Grave's disease 8-12  . Arm lesion 03/23/2011  . UTI (lower urinary tract infection) 08/14/2011  . Breast pain in female 11/17/2011  . Chest pain 11/17/2011  . Hematoma 12/18/2011  . Cervical cancer screening 03/22/2012  . Foot pain, left 03/22/2012  . Foot fracture, left 03/22/2012  . Tick bite of back 10/15/2012  . Preop  examination 02/03/2013  . Dysrhythmia   . Hypertension   . Shortness of breath     with exertion   . Heart murmur   . Urinary tract bacterial infections   . Arthritis     Past Surgical History  Procedure Laterality Date  . Appendectomy      Required revision for EColi infection, required recurrent packing  . Bowel obstruction      Requiring adhesions to be removed  . Aortic valve replacement    . Open heart with a cardiac aneurysm repair    . Cholecystectomy    . Tubal ligation    . Varicose vein surgery b/l legs    . Childhood exploratory surgery of heart    . Hernia repair  08-16-10  . Abdominal hysterectomy      sb/l spo, total  . Total knee arthroplasty Right 02/24/2013    Procedure: RIGHT TOTAL KNEE ARTHROPLASTY;  Surgeon: Johnn Hai, MD;  Location: [facility];  Service: Orthopedics;  Laterality: Right;  . Left heart catheterization with coronary angiogram N/A 12/05/2011    Procedure: LEFT HEART CATHETERIZATION WITH CORONARY ANGIOGRAM;  Surgeon: Sinclair Grooms, MD;  Location: [facility] CATH LAB;  Service: Cardiovascular;  Laterality: N/A;    Family History  Problem Relation Age of Onset  . Scoliosis Mother   . Arthritis Mother     Rheumatoid  . Aneurysm Mother     heart  . Alcohol abuse Father   . Depression Sister   .  Depression Brother   . Alcohol abuse Brother   . Cancer Maternal Grandmother     colon  . Diabetes Maternal Grandfather   . Heart disease Maternal Grandfather   . Alcohol abuse Maternal Grandfather   . Depression Paternal Grandmother   . Diabetes Paternal Grandmother   . Depression Paternal Grandfather   . Diabetes Paternal Grandfather   . Depression Sister   . Alcohol abuse Brother   . Depression Brother   . Heart disease Brother     History   Social History  . Marital Status: Married    Spouse Name: N/A    Number of Children: N/A  . Years of Education: N/A   Occupational History  . Not on file.   Social History Main Topics  . Smoking  status: Former Smoker    Quit date: 05/26/1990  . Smokeless tobacco: Never Used  . Alcohol Use: 0.0 oz/week    0 drink(s) per week     Comment: rare  . Drug Use: No  . Sexual Activity: No   Other Topics Concern  . Not on file   Social History Narrative    Current Outpatient Prescriptions on File Prior to Visit  Medication Sig Dispense Refill  . albuterol (PROVENTIL HFA;VENTOLIN HFA) 108 (90 BASE) MCG/ACT inhaler Inhale 2 puffs into the lungs every 6 (six) hours as needed for wheezing or shortness of breath. 1 Inhaler 0  . B Complex-C (B-COMPLEX WITH VITAMIN C) tablet Take 1 tablet by mouth daily.    . Cholecalciferol (VITAMIN D) 2000 UNITS tablet Take 2,000 Units by mouth daily.    . fenofibrate 160 MG tablet Take 1 tablet (160 mg total) by mouth daily. 30 tablet 6  . fluticasone (FLONASE) 50 MCG/ACT nasal spray Place 2 sprays into both nostrils daily. 16 g 6  . furosemide (LASIX) 20 MG tablet TAKE 1 TABLET (20 MG TOTAL) BY MOUTH EVERY MORNING. 90 tablet 0  . Krill Oil 300 MG CAPS Take 300 mg by mouth daily.    Marland Kitchen lamoTRIgine (LAMICTAL) 200 MG tablet Take 200 mg by mouth at bedtime.    . metoprolol succinate (TOPROL-XL) 25 MG 24 hr tablet Take 1 tablet (25 mg total) by mouth every morning. 30 tablet 5  . Multiple Vitamin (MULTIVITAMIN WITH MINERALS) TABS tablet Take 1 tablet by mouth daily.    . pantoprazole (PROTONIX) 40 MG tablet TAKE 1 TABLET (40 MG TOTAL) BY MOUTH DAILY. 30 tablet 0  . pravastatin (PRAVACHOL) 40 MG tablet Take 1 tablet (40 mg total) by mouth at bedtime. 30 tablet 11  . Probiotic Product (ALIGN) 4 MG CAPS Take 1 capsule by mouth daily. 30 capsule 0  . Propylene Glycol (SYSTANE BALANCE) 0.6 % SOLN Place 1 drop into both eyes at bedtime.     Marland Kitchen QUEtiapine (SEROQUEL) 200 MG tablet Take 400 mg by mouth at bedtime. 2- 200mg  tablets at bedtime    . venlafaxine XR (EFFEXOR-XR) 150 MG 24 hr capsule Take 150 mg by mouth every morning.     . vitamin C (ASCORBIC ACID) 500 MG  tablet Take 500 mg by mouth daily.    Marland Kitchen warfarin (COUMADIN) 5 MG tablet TAKE 1/2 TABLET ON ALL DAYS EXCEPT 1 TABLET ON MONDAYS, WEDNESDAYS, FRIDAYS, AND SUNDAYS OR AS DIRECTED BY COUMADIN CLINIC 24 tablet 2  . [DISCONTINUED] methimazole (TAPAZOLE) 5 MG tablet Take 1 tablet (5 mg total) by mouth 3 (three) times daily. 30 tablet 2   No current facility-administered medications on file  prior to visit.    Allergies  Allergen Reactions  . Sulfonamide Derivatives     headaches    Review of Systems  Review of Systems  Constitutional: Positive for fever and malaise/fatigue.  HENT: Negative for congestion.   Eyes: Negative for discharge.  Respiratory: Negative for shortness of breath.   Cardiovascular: Negative for chest pain, palpitations and leg swelling.  Gastrointestinal: Positive for abdominal pain. Negative for nausea and diarrhea.  Genitourinary: Negative for dysuria.  Musculoskeletal: Positive for myalgias. Negative for falls.  Skin: Negative for rash.  Neurological: Negative for loss of consciousness and headaches.  Endo/Heme/Allergies: Negative for polydipsia.  Psychiatric/Behavioral: Negative for depression and suicidal ideas. The patient is not nervous/anxious and does not have insomnia.     Objective  BP 144/79 mmHg  Pulse 58  Temp(Src) 98.3 F (36.8 C) (Oral)  Ht 5\' 8"  (1.727 m)  Wt 231 lb 3.2 oz (104.872 kg)  BMI 35.16 kg/m2  SpO2 95%  Physical Exam  Physical Exam  Constitutional: She is oriented to person, place, and time and well-developed, well-nourished, and in no distress. No distress.  HENT:  Head: Normocephalic and atraumatic.  Right Ear: External ear normal.  Left Ear: External ear normal.  Nose: Nose normal.  Mouth/Throat: Oropharynx is clear and moist. No oropharyngeal exudate.  Eyes: Conjunctivae are normal. Pupils are equal, round, and reactive to light. Right eye exhibits no discharge. Left eye exhibits no discharge. No scleral icterus.  Neck:  Normal range of motion. Neck supple. No thyromegaly present.  Cardiovascular: Normal rate, regular rhythm, normal heart sounds and intact distal pulses.   No murmur heard. Pulmonary/Chest: Effort normal and breath sounds normal. No respiratory distress. She has no wheezes. She has no rales.  Abdominal: Soft. Bowel sounds are normal. She exhibits no distension and no mass. There is no tenderness.  Musculoskeletal: Normal range of motion. She exhibits no edema or tenderness.  Lymphadenopathy:    She has no cervical adenopathy.  Neurological: She is alert and oriented to person, place, and time. She has normal reflexes. No cranial nerve deficit. Coordination normal.  Skin: Skin is warm and dry. No rash noted. She is not diaphoretic.  Psychiatric: Mood, memory and affect normal.    Lab Results  Component Value Date   TSH 1.65 02/24/2014   Lab Results  Component Value Date   WBC 5.4 04/06/2014   HGB 12.6 04/06/2014   HCT 37.2 04/06/2014   MCV 92.1 04/06/2014   PLT 214 04/06/2014   Lab Results  Component Value Date   CREATININE 0.90 04/06/2014   BUN 16 04/06/2014   NA 141 04/06/2014   K 4.3 04/06/2014   CL 100 04/06/2014   CO2 30 04/06/2014   Lab Results  Component Value Date   ALT 32 02/24/2014   AST 45* 02/24/2014   ALKPHOS 64 02/24/2014   BILITOT 0.9 02/24/2014   Lab Results  Component Value Date   CHOL 211* 02/24/2014   Lab Results  Component Value Date   HDL 51.30 02/24/2014   Lab Results  Component Value Date   LDLCALC 71 04/04/2013   Lab Results  Component Value Date   TRIG 253.0* 02/24/2014   Lab Results  Component Value Date   CHOLHDL 4 02/24/2014     Assessment & Plan  Atrial fibrillation Tolerating coumadin, rate controlled, check PT/INR today   Constipation Bowel moving well. Taking probiotics daily. Encouraged to start a fiber supplement   Hyperthyroidism Check TSH today   Recurrent  UTI struggling with malaise, fatigue, fevers,  suspect UA await results   Depression Following with psychiatry, denies any recent changes in meds, may need further consideration if no medical cause of malaise is found. Continues to struggle with an elderly mother and a difficult husband   Overweight Encouraged DASH diet, decrease po intake and increase exercise as tolerated. Needs 7-8 hours of sleep nightly. Avoid trans fats, eat small, frequent meals every 4-5 hours with lean proteins, complex carbs and healthy fats. Minimize simple carbs,

## 2014-06-27 NOTE — Assessment & Plan Note (Signed)
struggling with malaise, fatigue, fevers, suspect UA await results

## 2014-06-27 NOTE — Progress Notes (Signed)
Pre visit review using our clinic review tool, if applicable. No additional management support is needed unless otherwise documented below in the visit note. 

## 2014-06-27 NOTE — Assessment & Plan Note (Signed)
Bowel moving well. Taking probiotics daily. Encouraged to start a fiber supplement

## 2014-06-27 NOTE — Assessment & Plan Note (Signed)
Check TSH today

## 2014-06-27 NOTE — Assessment & Plan Note (Signed)
Tolerating coumadin, rate controlled, check PT/INR today

## 2014-06-27 NOTE — Assessment & Plan Note (Signed)
Following with psychiatry, denies any recent changes in meds, may need further consideration if no medical cause of malaise is found. Continues to struggle with an elderly mother and a difficult husband

## 2014-06-28 ENCOUNTER — Encounter: Payer: Self-pay | Admitting: Family Medicine

## 2014-06-28 ENCOUNTER — Other Ambulatory Visit: Payer: 59

## 2014-06-28 ENCOUNTER — Other Ambulatory Visit: Payer: Self-pay | Admitting: Family Medicine

## 2014-06-28 LAB — URINALYSIS
Bilirubin Urine: NEGATIVE
Hgb urine dipstick: NEGATIVE
Ketones, ur: NEGATIVE
Leukocytes, UA: NEGATIVE
Nitrite: NEGATIVE
Specific Gravity, Urine: 1.015 (ref 1.000–1.030)
Total Protein, Urine: NEGATIVE
Urine Glucose: NEGATIVE
Urobilinogen, UA: 0.2 (ref 0.0–1.0)
pH: 8 (ref 5.0–8.0)

## 2014-06-28 LAB — CBC
HCT: 41.1 % (ref 36.0–46.0)
Hemoglobin: 13.9 g/dL (ref 12.0–15.0)
MCHC: 33.8 g/dL (ref 30.0–36.0)
MCV: 90.6 fl (ref 78.0–100.0)
Platelets: 272 10*3/uL (ref 150.0–400.0)
RBC: 4.54 Mil/uL (ref 3.87–5.11)
RDW: 13.9 % (ref 11.5–15.5)
WBC: 6.8 10*3/uL (ref 4.0–10.5)

## 2014-06-28 LAB — COMPREHENSIVE METABOLIC PANEL
ALT: 32 U/L (ref 0–35)
AST: 28 U/L (ref 0–37)
Albumin: 4.4 g/dL (ref 3.5–5.2)
Alkaline Phosphatase: 61 U/L (ref 39–117)
BUN: 15 mg/dL (ref 6–23)
CO2: 30 mEq/L (ref 19–32)
Calcium: 9.7 mg/dL (ref 8.4–10.5)
Chloride: 103 mEq/L (ref 96–112)
Creatinine, Ser: 0.86 mg/dL (ref 0.40–1.20)
GFR: 70.7 mL/min (ref >60.00)
Glucose, Bld: 88 mg/dL (ref 70–99)
Potassium: 4.4 mEq/L (ref 3.5–5.1)
Sodium: 139 mEq/L (ref 135–145)
Total Bilirubin: 0.6 mg/dL (ref 0.2–1.2)
Total Protein: 7.2 g/dL (ref 6.0–8.3)

## 2014-06-28 LAB — PROTIME-INR
INR: 3.5 ratio — ABNORMAL HIGH (ref 0.8–1.0)
Prothrombin Time: 37.6 s — ABNORMAL HIGH (ref 9.6–13.1)

## 2014-06-28 LAB — TSH: TSH: 3.93 u[IU]/mL (ref 0.35–4.50)

## 2014-06-28 LAB — SEDIMENTATION RATE: Sed Rate: 15 mm/hr (ref 0–22)

## 2014-06-28 LAB — RHEUMATOID FACTOR: Rhuematoid fact SerPl-aCnc: <10 IU/mL (ref <=14)

## 2014-06-28 MED ORDER — WARFARIN SODIUM 5 MG PO TABS: ORAL_TABLET | ORAL | Status: DC

## 2014-06-29 ENCOUNTER — Ambulatory Visit: Payer: Self-pay

## 2014-06-29 DIAGNOSIS — Z952 Presence of prosthetic heart valve: Secondary | ICD-10-CM

## 2014-06-29 DIAGNOSIS — Z5181 Encounter for therapeutic drug level monitoring: Secondary | ICD-10-CM

## 2014-06-29 LAB — URINE CULTURE: Colony Count: >=100000

## 2014-06-29 LAB — ANA: Anti Nuclear Antibody(ANA): NEGATIVE

## 2014-06-29 NOTE — Progress Notes (Signed)
Notes Recorded by Mosie Lukes, MD on 06/28/2014 at 10:55 PM Patient did blood work on 2/2, INR still up. She was taking Coumadin 5 mg tabs 1/2 tab po tues and sat and a full tab every other day. Would have her switch to 1/2 tab 3 x a week and continue a full tab every other day. She is leaving town some time next week. Please try and get her in right before she leaves for an INR check here or at [facility].    Pt has been notified and made aware.  Anticoag Track changed and medication list updated to reflect new orders.    Pt's next appointment is already scheduled for 07/05/14 @ 12pm at [facility].

## 2014-06-30 ENCOUNTER — Other Ambulatory Visit: Payer: Self-pay

## 2014-06-30 DIAGNOSIS — N390 Urinary tract infection, site not specified: Secondary | ICD-10-CM

## 2014-06-30 MED ORDER — CIPROFLOXACIN HCL 250 MG PO TABS: 250.0000 mg | ORAL_TABLET | Freq: Two times a day (BID) | ORAL | Status: DC

## 2014-07-05 ENCOUNTER — Ambulatory Visit (INDEPENDENT_AMBULATORY_CARE_PROVIDER_SITE_OTHER): Payer: 59 | Admitting: General Practice

## 2014-07-05 DIAGNOSIS — Z952 Presence of prosthetic heart valve: Secondary | ICD-10-CM

## 2014-07-05 DIAGNOSIS — Z954 Presence of other heart-valve replacement: Secondary | ICD-10-CM

## 2014-07-05 DIAGNOSIS — Z5181 Encounter for therapeutic drug level monitoring: Secondary | ICD-10-CM

## 2014-07-05 LAB — POCT INR: INR: 5.6

## 2014-07-05 NOTE — Progress Notes (Signed)
Pre visit review using our clinic review tool, if applicable. No additional management support is needed unless otherwise documented below in the visit note. 

## 2014-07-05 NOTE — Progress Notes (Signed)
Agree with plan 

## 2014-07-05 NOTE — Patient Instructions (Addendum)
Patient going out of town tomorrow 2-11.  Instructed patient to go to [facility] and INR checked on Monday 2/15 and have results faxed to [phone].

## 2014-07-11 ENCOUNTER — Ambulatory Visit (INDEPENDENT_AMBULATORY_CARE_PROVIDER_SITE_OTHER): Payer: 59 | Admitting: General Practice

## 2014-07-11 ENCOUNTER — Telehealth: Payer: Self-pay | Admitting: General Practice

## 2014-07-11 DIAGNOSIS — Z954 Presence of other heart-valve replacement: Secondary | ICD-10-CM

## 2014-07-11 DIAGNOSIS — Z5181 Encounter for therapeutic drug level monitoring: Secondary | ICD-10-CM

## 2014-07-11 DIAGNOSIS — Z952 Presence of prosthetic heart valve: Secondary | ICD-10-CM

## 2014-07-11 LAB — PROTIME-INR: INR: 1.2 — AB (ref <=1.1)

## 2014-07-11 NOTE — Telephone Encounter (Signed)
Faxed new pt/inr order to lab.

## 2014-07-11 NOTE — Progress Notes (Signed)
Agree with plan 

## 2014-07-11 NOTE — Progress Notes (Signed)
Pre visit review using our clinic review tool, if applicable. No additional management support is needed unless otherwise documented below in the visit note. 

## 2014-07-20 LAB — PROTIME-INR: INR: 2 — AB (ref <=1.1)

## 2014-07-24 ENCOUNTER — Ambulatory Visit (INDEPENDENT_AMBULATORY_CARE_PROVIDER_SITE_OTHER): Payer: 59 | Admitting: General Practice

## 2014-07-24 DIAGNOSIS — Z5181 Encounter for therapeutic drug level monitoring: Secondary | ICD-10-CM

## 2014-07-24 DIAGNOSIS — Z954 Presence of other heart-valve replacement: Secondary | ICD-10-CM

## 2014-07-24 DIAGNOSIS — Z952 Presence of prosthetic heart valve: Secondary | ICD-10-CM

## 2014-07-24 NOTE — Progress Notes (Signed)
Pre visit review using our clinic review tool, if applicable. No additional management support is needed unless otherwise documented below in the visit note. 

## 2014-07-26 ENCOUNTER — Telehealth: Payer: Self-pay | Admitting: *Deleted

## 2014-07-26 NOTE — Telephone Encounter (Signed)
Patient cancelled apt.  Information left in SnapShot under specialty comments.

## 2014-07-28 ENCOUNTER — Encounter: Payer: BC Managed Care – PPO | Admitting: Family Medicine

## 2014-08-10 ENCOUNTER — Ambulatory Visit (INDEPENDENT_AMBULATORY_CARE_PROVIDER_SITE_OTHER): Payer: 59 | Admitting: General Practice

## 2014-08-10 DIAGNOSIS — Z954 Presence of other heart-valve replacement: Secondary | ICD-10-CM

## 2014-08-10 DIAGNOSIS — Z5181 Encounter for therapeutic drug level monitoring: Secondary | ICD-10-CM

## 2014-08-10 DIAGNOSIS — Z952 Presence of prosthetic heart valve: Secondary | ICD-10-CM

## 2014-08-10 LAB — PROTIME-INR: INR: 2.3 — AB (ref <=1.1)

## 2014-08-10 NOTE — Progress Notes (Signed)
Pre visit review using our clinic review tool, if applicable. No additional management support is needed unless otherwise documented below in the visit note. 

## 2014-08-15 ENCOUNTER — Ambulatory Visit: Payer: 59 | Admitting: Family Medicine

## 2014-08-21 ENCOUNTER — Other Ambulatory Visit: Payer: Self-pay | Admitting: Interventional Cardiology

## 2014-08-21 ENCOUNTER — Other Ambulatory Visit: Payer: Self-pay | Admitting: Family Medicine

## 2014-08-22 ENCOUNTER — Other Ambulatory Visit: Payer: Self-pay | Admitting: General Practice

## 2014-08-22 MED ORDER — WARFARIN SODIUM 5 MG PO TABS: ORAL_TABLET | ORAL | Status: DC

## 2014-08-23 ENCOUNTER — Other Ambulatory Visit: Payer: Self-pay | Admitting: Family Medicine

## 2014-08-23 ENCOUNTER — Telehealth: Payer: Self-pay | Admitting: Family Medicine

## 2014-08-23 NOTE — Telephone Encounter (Signed)
I am not sure what this medicine is. If she wants to bring in a bottle or an ingredient list I will review and decide if I can write an rx.

## 2014-08-23 NOTE — Telephone Encounter (Signed)
Will defer to her PCP Dr. Charlett Blake -- this medication is for weight loss.  Not an urgent issue.

## 2014-08-23 NOTE — Telephone Encounter (Signed)
Caller name: Amarrah, Meinhart Relation to pt: self  Call back number: [phone]   Reason for call:  Pt requesting a prescription for isagenix for reimbursement for income tax. Please advise

## 2014-08-24 ENCOUNTER — Telehealth: Payer: Self-pay | Admitting: Family Medicine

## 2014-08-24 NOTE — Telephone Encounter (Signed)
Informed the patient of PCP instructions.  The patient stated they needed by tonight to see their tax accountant, but understood PCP out of town until 08/28/14 and was ok with not getting.

## 2014-08-24 NOTE — Telephone Encounter (Signed)
See telephone note dated 08/23/14 is regarding same issue.

## 2014-08-24 NOTE — Telephone Encounter (Signed)
Caller name:Obenchain, Nekayla Relation to VE:LFYB Call back [employer]:  Reason for call: pt is requesting a rx for isagenix states she need it to take to her tax accountant on tonight. Would like to know if dr.blyth can write it for her and she can pick it up today. Please call to inform pt if she can get it today.

## 2014-09-01 ENCOUNTER — Ambulatory Visit (INDEPENDENT_AMBULATORY_CARE_PROVIDER_SITE_OTHER): Payer: 59

## 2014-09-01 ENCOUNTER — Telehealth: Payer: Self-pay | Admitting: Family Medicine

## 2014-09-01 DIAGNOSIS — I4891 Unspecified atrial fibrillation: Secondary | ICD-10-CM

## 2014-09-01 DIAGNOSIS — Z954 Presence of other heart-valve replacement: Secondary | ICD-10-CM

## 2014-09-01 DIAGNOSIS — Z5181 Encounter for therapeutic drug level monitoring: Secondary | ICD-10-CM | POA: Diagnosis not present

## 2014-09-01 DIAGNOSIS — Z952 Presence of prosthetic heart valve: Secondary | ICD-10-CM

## 2014-09-01 LAB — POCT INR: INR: 4.4

## 2014-09-01 NOTE — Telephone Encounter (Signed)
No she does not need a referral every time she goes to coumadin clinic. Referrals are done for 6 mths or 6 visits, whichever comes first. If she is needing a insurance referral, can a referral be placed along with MD that is managing her coumadin and dx.

## 2014-09-01 NOTE — Telephone Encounter (Signed)
Pt is a patient of Cardiologist Dr Daneen Schick, insurance referral placed. Auth # [id number]. Pt is aware.

## 2014-09-01 NOTE — Telephone Encounter (Signed)
Unable to place referral via Epic system states Pt is  "already under anticoagulation clinic."

## 2014-09-01 NOTE — Telephone Encounter (Signed)
Please advise?? Should Pt need referral for every visit to Coumadin Clinic?

## 2014-09-01 NOTE — Telephone Encounter (Signed)
Caller name: Stephnie Relation to pt: self Call back number: [phone] Pharmacy:  Reason for call:   Patient states that she now needs a referral everytime that she goes to coumadin clinic.   Mariann Laster [phone] at coumadin clinic   Next INR 09/15/14. This includes todays visit at Coumadin clinic

## 2014-09-04 ENCOUNTER — Other Ambulatory Visit: Payer: Self-pay | Admitting: Family Medicine

## 2014-09-04 MED ORDER — FUROSEMIDE 20 MG PO TABS: ORAL_TABLET | ORAL | Status: DC

## 2014-09-18 ENCOUNTER — Ambulatory Visit (INDEPENDENT_AMBULATORY_CARE_PROVIDER_SITE_OTHER): Payer: 59 | Admitting: *Deleted

## 2014-09-18 DIAGNOSIS — Z952 Presence of prosthetic heart valve: Secondary | ICD-10-CM

## 2014-09-18 DIAGNOSIS — I4891 Unspecified atrial fibrillation: Secondary | ICD-10-CM | POA: Diagnosis not present

## 2014-09-18 DIAGNOSIS — Z5181 Encounter for therapeutic drug level monitoring: Secondary | ICD-10-CM | POA: Diagnosis not present

## 2014-09-18 DIAGNOSIS — Z954 Presence of other heart-valve replacement: Secondary | ICD-10-CM | POA: Diagnosis not present

## 2014-09-18 LAB — POCT INR: INR: 3.1

## 2014-10-02 ENCOUNTER — Ambulatory Visit (INDEPENDENT_AMBULATORY_CARE_PROVIDER_SITE_OTHER): Payer: 59 | Admitting: *Deleted

## 2014-10-02 DIAGNOSIS — Z952 Presence of prosthetic heart valve: Secondary | ICD-10-CM

## 2014-10-02 DIAGNOSIS — I4891 Unspecified atrial fibrillation: Secondary | ICD-10-CM | POA: Diagnosis not present

## 2014-10-02 DIAGNOSIS — Z5181 Encounter for therapeutic drug level monitoring: Secondary | ICD-10-CM

## 2014-10-02 DIAGNOSIS — Z954 Presence of other heart-valve replacement: Secondary | ICD-10-CM | POA: Diagnosis not present

## 2014-10-02 LAB — POCT INR: INR: 3

## 2014-10-08 ENCOUNTER — Emergency Department (HOSPITAL_BASED_OUTPATIENT_CLINIC_OR_DEPARTMENT_OTHER)
Admission: EM | Admit: 2014-10-08 | Discharge: 2014-10-08 | Disposition: A | Discharge status: Home / Self Care | Payer: 59 | Attending: Emergency Medicine | Admitting: Emergency Medicine

## 2014-10-08 DIAGNOSIS — Z8739 Personal history of other diseases of the musculoskeletal system and connective tissue: Secondary | ICD-10-CM | POA: Insufficient documentation

## 2014-10-08 DIAGNOSIS — R040 Epistaxis: Secondary | ICD-10-CM

## 2014-10-08 DIAGNOSIS — Z8709 Personal history of other diseases of the respiratory system: Secondary | ICD-10-CM | POA: Diagnosis not present

## 2014-10-08 DIAGNOSIS — Z86018 Personal history of other benign neoplasm: Secondary | ICD-10-CM | POA: Diagnosis not present

## 2014-10-08 DIAGNOSIS — Z79899 Other long term (current) drug therapy: Secondary | ICD-10-CM | POA: Insufficient documentation

## 2014-10-08 DIAGNOSIS — F419 Anxiety disorder, unspecified: Secondary | ICD-10-CM | POA: Insufficient documentation

## 2014-10-08 DIAGNOSIS — Z8781 Personal history of (healed) traumatic fracture: Secondary | ICD-10-CM | POA: Insufficient documentation

## 2014-10-08 DIAGNOSIS — I10 Essential (primary) hypertension: Secondary | ICD-10-CM | POA: Diagnosis not present

## 2014-10-08 DIAGNOSIS — Z87891 Personal history of nicotine dependence: Secondary | ICD-10-CM | POA: Insufficient documentation

## 2014-10-08 DIAGNOSIS — E663 Overweight: Secondary | ICD-10-CM | POA: Diagnosis not present

## 2014-10-08 DIAGNOSIS — R011 Cardiac murmur, unspecified: Secondary | ICD-10-CM | POA: Diagnosis not present

## 2014-10-08 DIAGNOSIS — Z8719 Personal history of other diseases of the digestive system: Secondary | ICD-10-CM | POA: Insufficient documentation

## 2014-10-08 DIAGNOSIS — Z8742 Personal history of other diseases of the female genital tract: Secondary | ICD-10-CM | POA: Diagnosis not present

## 2014-10-08 DIAGNOSIS — Z862 Personal history of diseases of the blood and blood-forming organs and certain disorders involving the immune mechanism: Secondary | ICD-10-CM | POA: Diagnosis not present

## 2014-10-08 DIAGNOSIS — E780 Pure hypercholesterolemia: Secondary | ICD-10-CM | POA: Diagnosis not present

## 2014-10-08 DIAGNOSIS — Z8619 Personal history of other infectious and parasitic diseases: Secondary | ICD-10-CM | POA: Diagnosis not present

## 2014-10-08 DIAGNOSIS — Z87440 Personal history of urinary (tract) infections: Secondary | ICD-10-CM | POA: Diagnosis not present

## 2014-10-08 DIAGNOSIS — Z7951 Long term (current) use of inhaled steroids: Secondary | ICD-10-CM | POA: Insufficient documentation

## 2014-10-08 DIAGNOSIS — Z9889 Other specified postprocedural states: Secondary | ICD-10-CM | POA: Diagnosis not present

## 2014-10-08 DIAGNOSIS — F329 Major depressive disorder, single episode, unspecified: Secondary | ICD-10-CM | POA: Diagnosis not present

## 2014-10-08 DIAGNOSIS — Z872 Personal history of diseases of the skin and subcutaneous tissue: Secondary | ICD-10-CM | POA: Insufficient documentation

## 2014-10-08 LAB — CBC
HCT: 39.9 % (ref 36.0–46.0)
Hemoglobin: 13.2 g/dL (ref 12.0–15.0)
MCH: 29.9 pg (ref 26.0–34.0)
MCHC: 33.1 g/dL (ref 30.0–36.0)
MCV: 90.3 fL (ref 78.0–100.0)
Platelets: 219 10*3/uL (ref 150–400)
RBC: 4.42 MIL/uL (ref 3.87–5.11)
RDW: 14.1 % (ref 11.5–15.5)
WBC: 5 10*3/uL (ref 4.0–10.5)

## 2014-10-08 LAB — PROTIME-INR
INR: 2.5 — ABNORMAL HIGH (ref 0.00–1.49)
Prothrombin Time: 27 seconds — ABNORMAL HIGH (ref 11.6–15.2)

## 2014-10-08 MED ORDER — SILVER NITRATE-POT NITRATE 75-25 % EX MISC
1.0000 | Freq: Once | CUTANEOUS | Status: DC
  Filled 2014-10-08: qty 1

## 2014-10-08 NOTE — ED Provider Notes (Signed)
CSN: [id number]     Arrival date & time 10/08/14  0814 History   First MD Initiated Contact with Patient 10/08/14 1020     Chief Complaint  Patient presents with  . Epistaxis     (Consider location/radiation/quality/duration/timing/severity/associated sxs/prior Treatment) HPI  64 year old female presents with a right-sided nosebleed since this morning around 8 AM. She woke up with it. She's not had any cough or trouble breathing. No dizziness. She is on Coumadin for a mechanical aortic valve area last had her INR checked 1 week ago it was 3.1. Patient denies any recent trauma to her nose or any nasal congestion/URI symptoms. Nasal clip applied to nose prior to me seeing patient and now the blood has stopped.  Past Medical History  Diagnosis Date  . Anemia   . Anxiety   . Depression   . Diverticulosis   . Elevated cholesterol   . Obesity   . THYROMEGALY 07/22/2010  . Overweight(278.02) 03/11/2010  . Mixed hyperlipidemia 03/11/2010  . MEASLES, HX OF 03/11/2010  . KNEE PAIN, RIGHT 05/28/2010  . HYPERTRIGLYCERIDEMIA 03/11/2010  . HEART MURMUR, HX OF 03/11/2010  . FIBROIDS, UTERUS 03/11/2010  . Diarrhea 08/05/2010  . CONSTIPATION 03/11/2010  . CHICKENPOX, HX OF 03/11/2010  . BIPOLAR AFFECTIVE DISORDER 03/11/2010  . AORTIC VALVE REPLACEMENT, HX OF 03/11/2010  . ANEMIA 05/28/2010  . Acute bronchitis 07/22/2010  . Abdominal pain, generalized 03/19/2010  . Peripheral edema 11/18/2010  . Bacterial vaginitis 11/18/2010  . Hematuria 12/18/2010  . Diverticulitis 12/18/2010  . Hyperthyroidism 12/18/2010  . Fatigue 12/30/2010  . Renal insufficiency 12/30/2010  . Grave's disease 8-12  . Arm lesion 03/23/2011  . UTI (lower urinary tract infection) 08/14/2011  . Breast pain in female 11/17/2011  . Chest pain 11/17/2011  . Hematoma 12/18/2011  . Cervical cancer screening 03/22/2012  . Foot pain, left 03/22/2012  . Foot fracture, left 03/22/2012  . Tick bite of back 10/15/2012  . Preop examination  02/03/2013  . Dysrhythmia   . Hypertension   . Shortness of breath     with exertion   . Heart murmur   . Urinary tract bacterial infections   . Arthritis    Past Surgical History  Procedure Laterality Date  . Appendectomy      Required revision for EColi infection, required recurrent packing  . Bowel obstruction      Requiring adhesions to be removed  . Aortic valve replacement    . Open heart with a cardiac aneurysm repair    . Cholecystectomy    . Tubal ligation    . Varicose vein surgery b/l legs    . Childhood exploratory surgery of heart    . Hernia repair  08-16-10  . Abdominal hysterectomy      sb/l spo, total  . Total knee arthroplasty Right 02/24/2013    Procedure: RIGHT TOTAL KNEE ARTHROPLASTY;  Surgeon: Johnn Hai, MD;  Location: [facility];  Service: Orthopedics;  Laterality: Right;  . Left heart catheterization with coronary angiogram N/A 12/05/2011    Procedure: LEFT HEART CATHETERIZATION WITH CORONARY ANGIOGRAM;  Surgeon: Sinclair Grooms, MD;  Location: [facility] CATH LAB;  Service: Cardiovascular;  Laterality: N/A;   Family History  Problem Relation Age of Onset  . Scoliosis Mother   . Arthritis Mother     Rheumatoid  . Aneurysm Mother     heart  . Alcohol abuse Father   . Depression Sister   . Depression Brother   . Alcohol abuse Brother   .  Cancer Maternal Grandmother     colon  . Diabetes Maternal Grandfather   . Heart disease Maternal Grandfather   . Alcohol abuse Maternal Grandfather   . Depression Paternal Grandmother   . Diabetes Paternal Grandmother   . Depression Paternal Grandfather   . Diabetes Paternal Grandfather   . Depression Sister   . Alcohol abuse Brother   . Depression Brother   . Heart disease Brother    History  Substance Use Topics  . Smoking status: Former Smoker    Quit date: 05/26/1990  . Smokeless tobacco: Never Used  . Alcohol Use: 0.0 oz/week    0 drink(s) per week     Comment: rare   OB History    No data available      Review of Systems  Constitutional: Negative for fever.  HENT: Positive for nosebleeds.   Respiratory: Negative for cough and shortness of breath.   Cardiovascular: Negative for chest pain.  Neurological: Negative for dizziness and light-headedness.  All other systems reviewed and are negative.     Allergies  Sulfonamide derivatives  Home Medications   Prior to Admission medications   Medication Sig Start Date End Date Taking? Authorizing Provider  albuterol (PROVENTIL HFA;VENTOLIN HFA) 108 (90 BASE) MCG/ACT inhaler Inhale 2 puffs into the lungs every 6 (six) hours as needed for wheezing or shortness of breath. 10/14/13   Brunetta Jeans, PA-C  B Complex-C (B-COMPLEX WITH VITAMIN C) tablet Take 1 tablet by mouth daily.    Historical Provider, MD  Cholecalciferol (VITAMIN D) 2000 UNITS tablet Take 2,000 Units by mouth daily.    Historical Provider, MD  fenofibrate 160 MG tablet Take 1 tablet (160 mg total) by mouth daily. 09/30/13   Belva Crome, MD  fluticasone (FLONASE) 50 MCG/ACT nasal spray Place 2 sprays into both nostrils daily. 05/03/14   Brunetta Jeans, PA-C  furosemide (LASIX) 20 MG tablet TAKE 1 TABLET (20 MG TOTAL) BY MOUTH EVERY MORNING. 09/04/14   Mosie Lukes, MD  Krill Oil 300 MG CAPS Take 300 mg by mouth daily.    Historical Provider, MD  lamoTRIgine (LAMICTAL) 200 MG tablet Take 200 mg by mouth at bedtime.    Historical Provider, MD  metoprolol succinate (TOPROL-XL) 25 MG 24 hr tablet TAKE 1 TABLET (25 MG TOTAL) BY MOUTH EVERY MORNING. 08/21/14   Mosie Lukes, MD  Multiple Vitamin (MULTIVITAMIN WITH MINERALS) TABS tablet Take 1 tablet by mouth daily.    Historical Provider, MD  pantoprazole (PROTONIX) 40 MG tablet TAKE 1 TABLET (40 MG TOTAL) BY MOUTH DAILY. 08/23/14   Mosie Lukes, MD  pravastatin (PRAVACHOL) 40 MG tablet Take 1 tablet (40 mg total) by mouth at bedtime. 06/02/14   Belva Crome, MD  Probiotic Product (ALIGN) 4 MG CAPS Take 1 capsule by mouth  daily. 11/18/10   Mosie Lukes, MD  Propylene Glycol (SYSTANE BALANCE) 0.6 % SOLN Place 1 drop into both eyes at bedtime.     Historical Provider, MD  QUEtiapine (SEROQUEL) 200 MG tablet Take 400 mg by mouth at bedtime. 2- 200mg  tablets at bedtime    Historical Provider, MD  venlafaxine XR (EFFEXOR-XR) 150 MG 24 hr capsule Take 150 mg by mouth every morning.     Historical Provider, MD  vitamin C (ASCORBIC ACID) 500 MG tablet Take 500 mg by mouth daily.    Historical Provider, MD  warfarin (COUMADIN) 5 MG tablet Take as directed by anticoagulation clinic 08/22/14   Erline Levine  A Charlett Blake, MD   BP 152/97 mmHg  Pulse 67  Temp(Src) 98 F (36.7 C) (Oral)  Resp 20  Wt 243 lb (110.224 kg)  SpO2 95% Physical Exam  Constitutional: She is oriented to person, place, and time. She appears well-developed and well-nourished.  HENT:  Head: Normocephalic and atraumatic.  Right Ear: External ear normal.  Left Ear: External ear normal.  Nose: Nose normal.  After blood was cleared from nose there is no friability or obvious source of bleeding  Eyes: Right eye exhibits no discharge. Left eye exhibits no discharge.  Cardiovascular: Normal rate, regular rhythm and normal heart sounds.   Click from heart valve heard  Pulmonary/Chest: Effort normal and breath sounds normal.  Abdominal: She exhibits no distension.  Neurological: She is alert and oriented to person, place, and time.  Skin: Skin is warm and dry.  Nursing note and vitals reviewed.   ED Course  Procedures (including critical care time) Labs Review Labs Reviewed  PROTIME-INR - Abnormal; Notable for the following:    Prothrombin Time 27.0 (*)    INR 2.50 (*)    All other components within normal limits  CBC    Imaging Review No results found.   EKG Interpretation None      MDM   Final diagnoses:  Right-sided epistaxis    Patient's nares normal-appearing after clearance of clot. No obvious bleeding, probably came from posterior.  Given no recurrence of bleeding at this time I feel patient is stable for discharge. Her INRs therapeutic and her hemoglobin is normal. Discussed strict return precautions and how to treat nosebleeds at home.    Sherwood Gambler, MD 10/08/14 1600

## 2014-10-08 NOTE — ED Notes (Signed)
Patient reports that she started bleeding from right nare 0800, taking coumadin

## 2014-10-08 NOTE — Discharge Instructions (Signed)

## 2014-10-08 NOTE — ED Notes (Signed)
Nose clip applied

## 2014-10-08 NOTE — ED Notes (Signed)
Minimal bleeding post nose clip placement.

## 2014-10-30 ENCOUNTER — Ambulatory Visit (INDEPENDENT_AMBULATORY_CARE_PROVIDER_SITE_OTHER): Payer: 59 | Admitting: *Deleted

## 2014-10-30 DIAGNOSIS — Z954 Presence of other heart-valve replacement: Secondary | ICD-10-CM

## 2014-10-30 DIAGNOSIS — Z5181 Encounter for therapeutic drug level monitoring: Secondary | ICD-10-CM | POA: Diagnosis not present

## 2014-10-30 DIAGNOSIS — I4891 Unspecified atrial fibrillation: Secondary | ICD-10-CM

## 2014-10-30 DIAGNOSIS — Z952 Presence of prosthetic heart valve: Secondary | ICD-10-CM

## 2014-10-30 LAB — POCT INR: INR: 2.7

## 2014-11-20 ENCOUNTER — Other Ambulatory Visit: Payer: Self-pay | Admitting: Family Medicine

## 2014-11-20 ENCOUNTER — Ambulatory Visit (INDEPENDENT_AMBULATORY_CARE_PROVIDER_SITE_OTHER): Payer: 59

## 2014-11-20 DIAGNOSIS — I4891 Unspecified atrial fibrillation: Secondary | ICD-10-CM | POA: Diagnosis not present

## 2014-11-20 DIAGNOSIS — Z954 Presence of other heart-valve replacement: Secondary | ICD-10-CM

## 2014-11-20 DIAGNOSIS — Z952 Presence of prosthetic heart valve: Secondary | ICD-10-CM

## 2014-11-20 DIAGNOSIS — Z5181 Encounter for therapeutic drug level monitoring: Secondary | ICD-10-CM | POA: Diagnosis not present

## 2014-11-20 LAB — POCT INR: INR: 3.2

## 2014-12-05 ENCOUNTER — Telehealth: Payer: Self-pay | Admitting: General Practice

## 2014-12-05 NOTE — Telephone Encounter (Signed)
Faxed ICD-10 codes to [employer].

## 2014-12-18 ENCOUNTER — Ambulatory Visit (INDEPENDENT_AMBULATORY_CARE_PROVIDER_SITE_OTHER): Payer: 59 | Admitting: Medical

## 2014-12-18 ENCOUNTER — Ambulatory Visit (HOSPITAL_BASED_OUTPATIENT_CLINIC_OR_DEPARTMENT_OTHER)
Admission: RE | Admit: 2014-12-18 | Discharge: 2014-12-18 | Disposition: A | Discharge status: Home / Self Care | Payer: 59 | Source: Ambulatory Visit | Attending: Medical | Admitting: Medical

## 2014-12-18 ENCOUNTER — Encounter: Payer: Self-pay | Admitting: Medical

## 2014-12-18 VITALS — BP 140/80 | HR 56 | Temp 98.2°F | Ht 68.0 in | Wt 252.0 lb

## 2014-12-18 DIAGNOSIS — N390 Urinary tract infection, site not specified: Secondary | ICD-10-CM | POA: Diagnosis not present

## 2014-12-18 DIAGNOSIS — M545 Low back pain: Secondary | ICD-10-CM | POA: Diagnosis not present

## 2014-12-18 DIAGNOSIS — R82998 Other abnormal findings in urine: Secondary | ICD-10-CM

## 2014-12-18 DIAGNOSIS — R42 Dizziness and giddiness: Secondary | ICD-10-CM

## 2014-12-18 DIAGNOSIS — L989 Disorder of the skin and subcutaneous tissue, unspecified: Secondary | ICD-10-CM

## 2014-12-18 DIAGNOSIS — R5383 Other fatigue: Secondary | ICD-10-CM | POA: Diagnosis not present

## 2014-12-18 LAB — POCT URINALYSIS DIPSTICK
Bilirubin, UA: 1
Blood, UA: NEGATIVE
Glucose, UA: NEGATIVE
Ketones, UA: 5
Nitrite, UA: NEGATIVE
Protein, UA: 30
Spec Grav, UA: 1.025
Urobilinogen, UA: 0.2
pH, UA: 5

## 2014-12-18 MED ORDER — HYDROCODONE-ACETAMINOPHEN 5-325 MG PO TABS: 1.0000 | ORAL_TABLET | Freq: Four times a day (QID) | ORAL | Status: DC | PRN

## 2014-12-18 NOTE — Progress Notes (Signed)
Subjective:    Patient ID: [name], female    DOB: 1950-06-23, 64 y.o.   MRN: [id number]  HPI  Pt has some mild lower back pain and some mid abdomen pain. Pain denies any dysuria. No fever, or chills presently. Pt occasional uti about 2 times a year.  Also some back pain. But no related to movement but the other day twisted her back and it spasmed.    Pt also states some fatigue more so than usual. Also some faint off balance recently. No room spinning. No HA. No vision changes. No gross motor or sensory function deficits.  At home bp reading 124/70. But also husband got higher the other day.  Some moderate to severe pain at times. Lower back. Worse with standing. When cooking.  1 yr also of rt foream lesion. Mild irritation and getting larger.    Review of Systems  Constitutional: Positive for fatigue. Negative for fever and chills.  Respiratory: Negative for cough, chest tightness, shortness of breath and wheezing.   Cardiovascular: Negative for chest pain and palpitations.  Genitourinary: Negative for dysuria, urgency, frequency, hematuria, difficulty urinating and pelvic pain.  Musculoskeletal: Positive for back pain.       See hpi. Often time pain on standing.  Skin:       Rt forearm lesion.  Neurological: Positive for dizziness and light-headedness. Negative for seizures, syncope, speech difficulty, weakness, numbness and headaches.       Slight transient and on changing positions at times.  Hematological: Negative for adenopathy. Does not bruise/bleed easily.  Psychiatric/Behavioral: Negative for behavioral problems and confusion.    Past Medical History  Diagnosis Date  . Anemia   . Anxiety   . Depression   . Diverticulosis   . Elevated cholesterol   . Obesity   . THYROMEGALY 07/22/2010  . Overweight(278.02) 03/11/2010  . Mixed hyperlipidemia 03/11/2010  . MEASLES, HX OF 03/11/2010  . KNEE PAIN, RIGHT 05/28/2010  . HYPERTRIGLYCERIDEMIA 03/11/2010  . HEART  MURMUR, HX OF 03/11/2010  . FIBROIDS, UTERUS 03/11/2010  . Diarrhea 08/05/2010  . CONSTIPATION 03/11/2010  . CHICKENPOX, HX OF 03/11/2010  . BIPOLAR AFFECTIVE DISORDER 03/11/2010  . AORTIC VALVE REPLACEMENT, HX OF 03/11/2010  . ANEMIA 05/28/2010  . Acute bronchitis 07/22/2010  . Abdominal pain, generalized 03/19/2010  . Peripheral edema 11/18/2010  . Bacterial vaginitis 11/18/2010  . Hematuria 12/18/2010  . Diverticulitis 12/18/2010  . Hyperthyroidism 12/18/2010  . Fatigue 12/30/2010  . Renal insufficiency 12/30/2010  . Grave's disease 8-12  . Arm lesion 03/23/2011  . UTI (lower urinary tract infection) 08/14/2011  . Breast pain in female 11/17/2011  . Chest pain 11/17/2011  . Hematoma 12/18/2011  . Cervical cancer screening 03/22/2012  . Foot pain, left 03/22/2012  . Foot fracture, left 03/22/2012  . Tick bite of back 10/15/2012  . Preop examination 02/03/2013  . Dysrhythmia   . Hypertension   . Shortness of breath     with exertion   . Heart murmur   . Urinary tract bacterial infections   . Arthritis     History   Social History  . Marital Status: Married    Spouse Name: N/A  . Number of Children: N/A  . Years of Education: N/A   Occupational History  . Not on file.   Social History Main Topics  . Smoking status: Former Smoker    Quit date: 05/26/1990  . Smokeless tobacco: Never Used  . Alcohol Use: 0.0 oz/week  0 drink(s) per week     Comment: rare  . Drug Use: No  . Sexual Activity: No   Other Topics Concern  . Not on file   Social History Narrative    Past Surgical History  Procedure Laterality Date  . Appendectomy      Required revision for EColi infection, required recurrent packing  . Bowel obstruction      Requiring adhesions to be removed  . Aortic valve replacement    . Open heart with a cardiac aneurysm repair    . Cholecystectomy    . Tubal ligation    . Varicose vein surgery b/l legs    . Childhood exploratory surgery of heart    . Hernia  repair  08-16-10  . Abdominal hysterectomy      sb/l spo, total  . Total knee arthroplasty Right 02/24/2013    Procedure: RIGHT TOTAL KNEE ARTHROPLASTY;  Surgeon: Johnn Hai, MD;  Location: [facility];  Service: Orthopedics;  Laterality: Right;  . Left heart catheterization with coronary angiogram N/A 12/05/2011    Procedure: LEFT HEART CATHETERIZATION WITH CORONARY ANGIOGRAM;  Surgeon: Sinclair Grooms, MD;  Location: [facility] CATH LAB;  Service: Cardiovascular;  Laterality: N/A;    Family History  Problem Relation Age of Onset  . Scoliosis Mother   . Arthritis Mother     Rheumatoid  . Aneurysm Mother     heart  . Alcohol abuse Father   . Depression Sister   . Depression Brother   . Alcohol abuse Brother   . Cancer Maternal Grandmother     colon  . Diabetes Maternal Grandfather   . Heart disease Maternal Grandfather   . Alcohol abuse Maternal Grandfather   . Depression Paternal Grandmother   . Diabetes Paternal Grandmother   . Depression Paternal Grandfather   . Diabetes Paternal Grandfather   . Depression Sister   . Alcohol abuse Brother   . Depression Brother   . Heart disease Brother     Allergies  Allergen Reactions  . Sulfonamide Derivatives     headaches    Current Outpatient Prescriptions on File Prior to Visit  Medication Sig Dispense Refill  . albuterol (PROVENTIL HFA;VENTOLIN HFA) 108 (90 BASE) MCG/ACT inhaler Inhale 2 puffs into the lungs every 6 (six) hours as needed for wheezing or shortness of breath. 1 Inhaler 0  . B Complex-C (B-COMPLEX WITH VITAMIN C) tablet Take 1 tablet by mouth daily.    . fenofibrate 160 MG tablet Take 1 tablet (160 mg total) by mouth daily. 30 tablet 6  . furosemide (LASIX) 20 MG tablet TAKE 1 TABLET (20 MG TOTAL) BY MOUTH EVERY MORNING. 90 tablet 1  . Krill Oil 300 MG CAPS Take 300 mg by mouth daily.    Marland Kitchen lamoTRIgine (LAMICTAL) 200 MG tablet Take 200 mg by mouth at bedtime.    . metoprolol succinate (TOPROL-XL) 25 MG 24 hr tablet TAKE  1 TABLET (25 MG TOTAL) BY MOUTH EVERY MORNING. 30 tablet 4  . Multiple Vitamin (MULTIVITAMIN WITH MINERALS) TABS tablet Take 1 tablet by mouth daily.    . pantoprazole (PROTONIX) 40 MG tablet TAKE 1 TABLET (40 MG TOTAL) BY MOUTH DAILY. 30 tablet 5  . pravastatin (PRAVACHOL) 40 MG tablet Take 1 tablet (40 mg total) by mouth at bedtime. 30 tablet 11  . Propylene Glycol (SYSTANE BALANCE) 0.6 % SOLN Place 1 drop into both eyes at bedtime.     Marland Kitchen QUEtiapine (SEROQUEL) 200 MG tablet Take  400 mg by mouth at bedtime. 2- 200mg  tablets at bedtime    . venlafaxine XR (EFFEXOR-XR) 150 MG 24 hr capsule Take 150 mg by mouth every morning.     . vitamin C (ASCORBIC ACID) 500 MG tablet Take 500 mg by mouth daily.    Marland Kitchen warfarin (COUMADIN) 5 MG tablet Take as directed by anticoagulation clinic 30 tablet 3  . Cholecalciferol (VITAMIN D) 2000 UNITS tablet Take 2,000 Units by mouth daily.    . fluticasone (FLONASE) 50 MCG/ACT nasal spray Place 2 sprays into both nostrils daily. 16 g 6  . Probiotic Product (ALIGN) 4 MG CAPS Take 1 capsule by mouth daily. 30 capsule 0  . [DISCONTINUED] methimazole (TAPAZOLE) 5 MG tablet Take 1 tablet (5 mg total) by mouth 3 (three) times daily. 30 tablet 2   No current facility-administered medications on file prior to visit.    BP 140/80 mmHg  Pulse 56  Temp(Src) 98.2 F (36.8 C) (Oral)  Ht 5\' 8"  (1.727 m)  Wt 252 lb (114.306 kg)  BMI 38.33 kg/m2  SpO2 96%       Objective:   Physical Exam  General Mental Status- Alert. General Appearance- Not in acute distress.   Skin General: Color- Normal Color. Moisture- Normal Moisture.  Neck Carotid Arteries- Normal color. Moisture- Normal Moisture. No carotid bruits. No JVD.  Chest and Lung Exam Auscultation: Breath Sounds:-Normal. cta  Cardiovascular Auscultation:Rythm- Regular. Murmurs & Other Heart Sounds:Auscultation of the heart reveals- No Murmurs.  Abdomen Inspection:-Inspeection  Normal. Palpation/Percussion:Note:No mass. Palpation and Percussion of the abdomen reveal- Non Tender, Non Distended + BS, no rebound or guarding.    Neurologic Cranial Nerve exam:- CN III-XII intact(No nystagmus), symmetric smile. Drift Test:- No drift. Finger to Nose:- Normal/Intact Strength:- 5/5 equal and symmetric strength both upper and lower extremities.  Back- no cva tenderness. But pain on straight leg lift.  Rt forearm- small 8 mm area raised iregular. Uniform in color. Appearance s. Keratosis.      Assessment & Plan:  We are sending out your urine for culture. If + for infection will rx antibiotic.  With your low back pain. Some features sound lumbar in origin. So will get xray. Limit # of norco to use.   Hydrate well and recommend propel fitness water. Will get cmp to check your electrolytes.   For you fatigue including cbc in lab work.  Your slight dizziness seems some positional and neuro exam negative. Check bp daily and update me on wed afternoon with readings. If any neurologic signs or symptoms with dizziness then ED evaluation.  Follow up in 7-10 days or as needed

## 2014-12-18 NOTE — Progress Notes (Signed)
Pre visit review using our clinic review tool, if applicable. No additional management support is needed unless otherwise documented below in the visit note. 

## 2014-12-18 NOTE — Patient Instructions (Addendum)
We are sending out your urine for culture. If + for infection will rx antibiotic.  With your low back pain. Some features sound lumbar in origin. So will get xray. Limit # of norco to use.   Hydrate well and recommend propel fitness water. Will get cmp to check your electrolytes.   For you fatigue including cbc in lab work.  Your slight dizziness seems some positional and neuro exam negative. Check bp daily and update me on wed afternoon with readings. If any neurologic signs or symptoms with dizziness then ED evaluation.  Follow up in 7-10 days or as needed

## 2014-12-19 ENCOUNTER — Encounter: Payer: Self-pay | Admitting: Family Medicine

## 2014-12-19 ENCOUNTER — Telehealth: Payer: Self-pay | Admitting: Family Medicine

## 2014-12-19 DIAGNOSIS — R944 Abnormal results of kidney function studies: Secondary | ICD-10-CM

## 2014-12-19 LAB — CBC WITH DIFFERENTIAL/PLATELET
Basophils Absolute: 0.1 10*3/uL (ref 0.0–0.1)
Basophils Relative: 2 % (ref 0.0–3.0)
Eosinophils Absolute: 0.2 10*3/uL (ref 0.0–0.7)
Eosinophils Relative: 2.7 % (ref 0.0–5.0)
HCT: 41.1 % (ref 36.0–46.0)
Hemoglobin: 13.9 g/dL (ref 12.0–15.0)
Lymphocytes Relative: 26.1 % (ref 12.0–46.0)
Lymphs Abs: 2 10*3/uL (ref 0.7–4.0)
MCHC: 33.7 g/dL (ref 30.0–36.0)
MCV: 90.2 fl (ref 78.0–100.0)
Monocytes Absolute: 0.4 10*3/uL (ref 0.1–1.0)
Monocytes Relative: 5.6 % (ref 3.0–12.0)
Neutro Abs: 4.9 10*3/uL (ref 1.4–7.7)
Neutrophils Relative %: 63.6 % (ref 43.0–77.0)
Platelets: 253 10*3/uL (ref 150.0–400.0)
RBC: 4.55 Mil/uL (ref 3.87–5.11)
RDW: 13.6 % (ref 11.5–15.5)
WBC: 7.7 10*3/uL (ref 4.0–10.5)

## 2014-12-19 LAB — COMPREHENSIVE METABOLIC PANEL
ALT: 41 U/L — ABNORMAL HIGH (ref 0–35)
AST: 40 U/L — ABNORMAL HIGH (ref 0–37)
Albumin: 4.5 g/dL (ref 3.5–5.2)
Alkaline Phosphatase: 63 U/L (ref 39–117)
BUN: 23 mg/dL (ref 6–23)
CO2: 27 mEq/L (ref 19–32)
Calcium: 9.9 mg/dL (ref 8.4–10.5)
Chloride: 99 mEq/L (ref 96–112)
Creatinine, Ser: 1.15 mg/dL (ref 0.40–1.20)
GFR: 50.48 mL/min — ABNORMAL LOW (ref >60.00)
Glucose, Bld: 90 mg/dL (ref 70–99)
Potassium: 4.3 mEq/L (ref 3.5–5.1)
Sodium: 137 mEq/L (ref 135–145)
Total Bilirubin: 0.6 mg/dL (ref 0.2–1.2)
Total Protein: 7.7 g/dL (ref 6.0–8.3)

## 2014-12-19 LAB — URINE CULTURE: Colony Count: 60000

## 2014-12-19 NOTE — Telephone Encounter (Signed)
Pt returning your call for xray results. Call back [phone].

## 2014-12-20 MED ORDER — CEPHALEXIN 500 MG PO CAPS: 500.0000 mg | ORAL_CAPSULE | Freq: Two times a day (BID) | ORAL | Status: DC

## 2014-12-20 NOTE — Telephone Encounter (Signed)
rx cephalexin for possible uti.

## 2014-12-20 NOTE — Telephone Encounter (Signed)
Patient notified states she picked up Cephalexin and will call in 2 weeks to schedule lab appointment.

## 2014-12-20 NOTE — Telephone Encounter (Signed)
cmp order to check cr and gfr in 2 wks.

## 2014-12-25 ENCOUNTER — Other Ambulatory Visit: Payer: Self-pay

## 2014-12-26 ENCOUNTER — Ambulatory Visit (INDEPENDENT_AMBULATORY_CARE_PROVIDER_SITE_OTHER): Payer: 59 | Admitting: *Deleted

## 2014-12-26 DIAGNOSIS — Z5181 Encounter for therapeutic drug level monitoring: Secondary | ICD-10-CM | POA: Diagnosis not present

## 2014-12-26 DIAGNOSIS — I4891 Unspecified atrial fibrillation: Secondary | ICD-10-CM | POA: Diagnosis not present

## 2014-12-26 DIAGNOSIS — Z954 Presence of other heart-valve replacement: Secondary | ICD-10-CM | POA: Diagnosis not present

## 2014-12-26 DIAGNOSIS — Z952 Presence of prosthetic heart valve: Secondary | ICD-10-CM

## 2014-12-26 LAB — POCT INR: INR: 1.7

## 2014-12-26 MED ORDER — WARFARIN SODIUM 5 MG PO TABS: ORAL_TABLET | ORAL | Status: DC

## 2015-01-01 ENCOUNTER — Other Ambulatory Visit: Payer: Self-pay | Admitting: Medical

## 2015-01-01 DIAGNOSIS — I10 Essential (primary) hypertension: Secondary | ICD-10-CM

## 2015-01-02 ENCOUNTER — Other Ambulatory Visit (INDEPENDENT_AMBULATORY_CARE_PROVIDER_SITE_OTHER): Payer: 59

## 2015-01-02 DIAGNOSIS — I10 Essential (primary) hypertension: Secondary | ICD-10-CM | POA: Diagnosis not present

## 2015-01-02 LAB — COMPREHENSIVE METABOLIC PANEL
ALT: 28 U/L (ref 0–35)
AST: 24 U/L (ref 0–37)
Albumin: 4 g/dL (ref 3.5–5.2)
Alkaline Phosphatase: 61 U/L (ref 39–117)
BUN: 18 mg/dL (ref 6–23)
CO2: 27 mEq/L (ref 19–32)
Calcium: 9.1 mg/dL (ref 8.4–10.5)
Chloride: 103 mEq/L (ref 96–112)
Creatinine, Ser: 0.9 mg/dL (ref 0.40–1.20)
GFR: 66.98 mL/min (ref >60.00)
Glucose, Bld: 163 mg/dL — ABNORMAL HIGH (ref 70–99)
Potassium: 4 mEq/L (ref 3.5–5.1)
Sodium: 138 mEq/L (ref 135–145)
Total Bilirubin: 0.4 mg/dL (ref 0.2–1.2)
Total Protein: 6.8 g/dL (ref 6.0–8.3)

## 2015-01-03 ENCOUNTER — Telehealth: Payer: Self-pay | Admitting: *Deleted

## 2015-01-03 DIAGNOSIS — R739 Hyperglycemia, unspecified: Secondary | ICD-10-CM

## 2015-01-03 NOTE — Telephone Encounter (Signed)
I would advise pt to complete a1c, sugar is elevated. Other labs ok. This is [name]'s pt, I am covering.

## 2015-01-03 NOTE — Telephone Encounter (Signed)
-----   Message from Debbrah Alar, NP sent at 01/03/2015  9:32 AM EDT ----- Sugar is elevated. Could lab please add on A1C?

## 2015-01-03 NOTE — Telephone Encounter (Signed)
Unable to add hgb a1c as no lavender tube was ordered. Please advise.

## 2015-01-08 NOTE — Telephone Encounter (Signed)
Notified pt and scheduled lab appt for 01/11/15 at 11am.  Future lab order entered.

## 2015-01-09 ENCOUNTER — Ambulatory Visit (INDEPENDENT_AMBULATORY_CARE_PROVIDER_SITE_OTHER): Payer: 59

## 2015-01-09 DIAGNOSIS — Z954 Presence of other heart-valve replacement: Secondary | ICD-10-CM | POA: Diagnosis not present

## 2015-01-09 DIAGNOSIS — Z5181 Encounter for therapeutic drug level monitoring: Secondary | ICD-10-CM

## 2015-01-09 DIAGNOSIS — I4891 Unspecified atrial fibrillation: Secondary | ICD-10-CM

## 2015-01-09 DIAGNOSIS — Z952 Presence of prosthetic heart valve: Secondary | ICD-10-CM

## 2015-01-09 LAB — POCT INR: INR: 2.5

## 2015-01-11 ENCOUNTER — Other Ambulatory Visit (INDEPENDENT_AMBULATORY_CARE_PROVIDER_SITE_OTHER): Payer: 59

## 2015-01-11 DIAGNOSIS — R944 Abnormal results of kidney function studies: Secondary | ICD-10-CM

## 2015-01-11 DIAGNOSIS — R739 Hyperglycemia, unspecified: Secondary | ICD-10-CM

## 2015-01-11 LAB — COMPREHENSIVE METABOLIC PANEL
ALT: 27 U/L (ref 0–35)
AST: 25 U/L (ref 0–37)
Albumin: 4.1 g/dL (ref 3.5–5.2)
Alkaline Phosphatase: 65 U/L (ref 39–117)
BUN: 13 mg/dL (ref 6–23)
CO2: 28 mEq/L (ref 19–32)
Calcium: 9.4 mg/dL (ref 8.4–10.5)
Chloride: 103 mEq/L (ref 96–112)
Creatinine, Ser: 0.86 mg/dL (ref 0.40–1.20)
GFR: 70.58 mL/min (ref >60.00)
Glucose, Bld: 134 mg/dL — ABNORMAL HIGH (ref 70–99)
Potassium: 4.2 mEq/L (ref 3.5–5.1)
Sodium: 139 mEq/L (ref 135–145)
Total Bilirubin: 0.4 mg/dL (ref 0.2–1.2)
Total Protein: 6.9 g/dL (ref 6.0–8.3)

## 2015-01-11 LAB — HEMOGLOBIN A1C: Hgb A1c MFr Bld: 5.7 % (ref 4.6–6.5)

## 2015-01-22 ENCOUNTER — Other Ambulatory Visit: Payer: Self-pay | Admitting: Interventional Cardiology

## 2015-02-06 ENCOUNTER — Ambulatory Visit (INDEPENDENT_AMBULATORY_CARE_PROVIDER_SITE_OTHER): Payer: 59

## 2015-02-06 DIAGNOSIS — Z954 Presence of other heart-valve replacement: Secondary | ICD-10-CM

## 2015-02-06 DIAGNOSIS — I4891 Unspecified atrial fibrillation: Secondary | ICD-10-CM

## 2015-02-06 DIAGNOSIS — Z952 Presence of prosthetic heart valve: Secondary | ICD-10-CM

## 2015-02-06 DIAGNOSIS — Z5181 Encounter for therapeutic drug level monitoring: Secondary | ICD-10-CM | POA: Diagnosis not present

## 2015-02-06 LAB — POCT INR: INR: 2.7

## 2015-03-12 ENCOUNTER — Ambulatory Visit (INDEPENDENT_AMBULATORY_CARE_PROVIDER_SITE_OTHER): Payer: 59 | Admitting: *Deleted

## 2015-03-12 DIAGNOSIS — Z954 Presence of other heart-valve replacement: Secondary | ICD-10-CM

## 2015-03-12 DIAGNOSIS — Z952 Presence of prosthetic heart valve: Secondary | ICD-10-CM

## 2015-03-12 DIAGNOSIS — I4891 Unspecified atrial fibrillation: Secondary | ICD-10-CM

## 2015-03-12 DIAGNOSIS — Z5181 Encounter for therapeutic drug level monitoring: Secondary | ICD-10-CM

## 2015-03-12 LAB — POCT INR: INR: 2.8

## 2015-03-20 ENCOUNTER — Other Ambulatory Visit: Payer: Self-pay | Admitting: Family Medicine

## 2015-03-20 MED ORDER — PANTOPRAZOLE SODIUM 40 MG PO TBEC: DELAYED_RELEASE_TABLET | ORAL | Status: DC

## 2015-03-20 MED ORDER — FUROSEMIDE 20 MG PO TABS: ORAL_TABLET | ORAL | Status: DC

## 2015-03-22 ENCOUNTER — Other Ambulatory Visit: Payer: Self-pay | Admitting: Family Medicine

## 2015-03-22 MED ORDER — METOPROLOL SUCCINATE ER 25 MG PO TB24: ORAL_TABLET | ORAL | Status: DC

## 2015-04-03 ENCOUNTER — Ambulatory Visit (INDEPENDENT_AMBULATORY_CARE_PROVIDER_SITE_OTHER): Payer: 59 | Admitting: Family Medicine

## 2015-04-03 ENCOUNTER — Other Ambulatory Visit (HOSPITAL_COMMUNITY)
Admission: RE | Admit: 2015-04-03 | Discharge: 2015-04-03 | Disposition: A | Discharge status: Home / Self Care | Payer: 59 | Source: Ambulatory Visit | Attending: Family Medicine | Admitting: Family Medicine

## 2015-04-03 ENCOUNTER — Ambulatory Visit (HOSPITAL_BASED_OUTPATIENT_CLINIC_OR_DEPARTMENT_OTHER)
Admission: RE | Admit: 2015-04-03 | Discharge: 2015-04-03 | Disposition: A | Discharge status: Home / Self Care | Payer: 59 | Source: Ambulatory Visit | Attending: Family Medicine | Admitting: Family Medicine

## 2015-04-03 ENCOUNTER — Encounter: Payer: Self-pay | Admitting: Family Medicine

## 2015-04-03 VITALS — BP 114/82 | HR 90 | Temp 97.6°F | Ht 68.0 in | Wt 257.4 lb

## 2015-04-03 DIAGNOSIS — M25552 Pain in left hip: Secondary | ICD-10-CM

## 2015-04-03 DIAGNOSIS — N390 Urinary tract infection, site not specified: Secondary | ICD-10-CM | POA: Diagnosis not present

## 2015-04-03 DIAGNOSIS — I4891 Unspecified atrial fibrillation: Secondary | ICD-10-CM

## 2015-04-03 DIAGNOSIS — Z952 Presence of prosthetic heart valve: Secondary | ICD-10-CM

## 2015-04-03 DIAGNOSIS — Z113 Encounter for screening for infections with a predominantly sexual mode of transmission: Secondary | ICD-10-CM | POA: Diagnosis not present

## 2015-04-03 DIAGNOSIS — Z23 Encounter for immunization: Secondary | ICD-10-CM

## 2015-04-03 DIAGNOSIS — E0590 Thyrotoxicosis, unspecified without thyrotoxic crisis or storm: Secondary | ICD-10-CM | POA: Diagnosis not present

## 2015-04-03 DIAGNOSIS — Z1239 Encounter for other screening for malignant neoplasm of breast: Secondary | ICD-10-CM

## 2015-04-03 DIAGNOSIS — E782 Mixed hyperlipidemia: Secondary | ICD-10-CM

## 2015-04-03 DIAGNOSIS — I10 Essential (primary) hypertension: Secondary | ICD-10-CM

## 2015-04-03 DIAGNOSIS — E663 Overweight: Secondary | ICD-10-CM

## 2015-04-03 DIAGNOSIS — N760 Acute vaginitis: Secondary | ICD-10-CM | POA: Diagnosis present

## 2015-04-03 DIAGNOSIS — Z1231 Encounter for screening mammogram for malignant neoplasm of breast: Secondary | ICD-10-CM | POA: Insufficient documentation

## 2015-04-03 DIAGNOSIS — Z954 Presence of other heart-valve replacement: Secondary | ICD-10-CM

## 2015-04-03 LAB — URINALYSIS
Bilirubin Urine: NEGATIVE
Hgb urine dipstick: NEGATIVE
Ketones, ur: NEGATIVE
Leukocytes, UA: NEGATIVE
Nitrite: NEGATIVE
Specific Gravity, Urine: >=1.03 — AB (ref 1.000–1.030)
Total Protein, Urine: NEGATIVE
Urine Glucose: NEGATIVE
Urobilinogen, UA: 0.2 (ref 0.0–1.0)
pH: 5.5 (ref 5.0–8.0)

## 2015-04-03 LAB — COMPREHENSIVE METABOLIC PANEL
ALT: 32 U/L (ref 0–35)
AST: 23 U/L (ref 0–37)
Albumin: 4.3 g/dL (ref 3.5–5.2)
Alkaline Phosphatase: 69 U/L (ref 39–117)
BUN: 18 mg/dL (ref 6–23)
CO2: 26 mEq/L (ref 19–32)
Calcium: 9.5 mg/dL (ref 8.4–10.5)
Chloride: 101 mEq/L (ref 96–112)
Creatinine, Ser: 0.88 mg/dL (ref 0.40–1.20)
GFR: 68.68 mL/min (ref >60.00)
Glucose, Bld: 219 mg/dL — ABNORMAL HIGH (ref 70–99)
Potassium: 3.8 mEq/L (ref 3.5–5.1)
Sodium: 138 mEq/L (ref 135–145)
Total Bilirubin: 0.4 mg/dL (ref 0.2–1.2)
Total Protein: 7.2 g/dL (ref 6.0–8.3)

## 2015-04-03 LAB — LIPID PANEL
Cholesterol: 245 mg/dL — ABNORMAL HIGH (ref 0–200)
HDL: 46 mg/dL (ref >39.00)
Total CHOL/HDL Ratio: 5
Triglycerides: 667 mg/dL — ABNORMAL HIGH (ref 0.0–149.0)

## 2015-04-03 LAB — LDL CHOLESTEROL, DIRECT: Direct LDL: 130 mg/dL

## 2015-04-03 LAB — CBC
HCT: 41.5 % (ref 36.0–46.0)
Hemoglobin: 13.9 g/dL (ref 12.0–15.0)
MCHC: 33.5 g/dL (ref 30.0–36.0)
MCV: 89.2 fl (ref 78.0–100.0)
Platelets: 224 10*3/uL (ref 150.0–400.0)
RBC: 4.65 Mil/uL (ref 3.87–5.11)
RDW: 13.8 % (ref 11.5–15.5)
WBC: 6.6 10*3/uL (ref 4.0–10.5)

## 2015-04-03 LAB — TSH: TSH: 3.38 u[IU]/mL (ref 0.35–4.50)

## 2015-04-03 MED ORDER — CYCLOBENZAPRINE HCL 10 MG PO TABS: 10.0000 mg | ORAL_TABLET | Freq: Three times a day (TID) | ORAL | Status: DC | PRN

## 2015-04-03 MED ORDER — PREDNISONE 20 MG PO TABS: 20.0000 mg | ORAL_TABLET | Freq: Every day | ORAL | Status: DC

## 2015-04-03 NOTE — Progress Notes (Signed)
Pre visit review using our clinic review tool, if applicable. No additional management support is needed unless otherwise documented below in the visit note. 

## 2015-04-03 NOTE — Patient Instructions (Signed)
Moist hip and then salon pas patches   Hip Pain Your hip is the joint between your upper legs and your lower pelvis. The bones, cartilage, tendons, and muscles of your hip joint perform a lot of work each day supporting your body weight and allowing you to move around. Hip pain can range from a minor ache to severe pain in one or both of your hips. Pain may be felt on the inside of the hip joint near the groin, or the outside near the buttocks and upper thigh. You may have swelling or stiffness as well.  HOME CARE INSTRUCTIONS   Take medicines only as directed by your health care provider.  Apply ice to the injured area:  Put ice in a plastic bag.  Place a towel between your skin and the bag.  Leave the ice on for 15-20 minutes at a time, 3-4 times a day.  Keep your leg raised (elevated) when possible to lessen swelling.  Avoid activities that cause pain.  Follow specific exercises as directed by your health care provider.  Sleep with a pillow between your legs on your most comfortable side.  Record how often you have hip pain, the location of the pain, and what it feels like. SEEK MEDICAL CARE IF:   You are unable to put weight on your leg.  Your hip is red or swollen or very tender to touch.  Your pain or swelling continues or worsens after 1 week.  You have increasing difficulty walking.  You have a fever. SEEK IMMEDIATE MEDICAL CARE IF:   You have fallen.  You have a sudden increase in pain and swelling in your hip. MAKE SURE YOU:   Understand these instructions.  Will watch your condition.  Will get help right away if you are not doing well or get worse.   This information is not intended to replace advice given to you by your health care provider. Make sure you discuss any questions you have with your health care provider.   Document Released: 10/30/2009 Document Revised: 06/02/2014 Document Reviewed: 01/06/2013 Elsevier Interactive Patient Education [employer]

## 2015-04-04 LAB — URINE CYTOLOGY ANCILLARY ONLY
Chlamydia: NEGATIVE
Neisseria Gonorrhea: NEGATIVE
Trichomonas: NEGATIVE

## 2015-04-05 ENCOUNTER — Other Ambulatory Visit: Payer: Self-pay | Admitting: Family Medicine

## 2015-04-05 LAB — URINE CYTOLOGY ANCILLARY ONLY
Bacterial vaginitis: NEGATIVE
Candida vaginitis: NEGATIVE

## 2015-04-05 LAB — URINE CULTURE
Colony Count: NO GROWTH
Organism ID, Bacteria: NO GROWTH

## 2015-04-05 MED ORDER — ROSUVASTATIN CALCIUM 40 MG PO TABS: 40.0000 mg | ORAL_TABLET | Freq: Every day | ORAL | Status: DC

## 2015-04-09 ENCOUNTER — Ambulatory Visit (INDEPENDENT_AMBULATORY_CARE_PROVIDER_SITE_OTHER): Payer: 59 | Admitting: *Deleted

## 2015-04-09 DIAGNOSIS — Z5181 Encounter for therapeutic drug level monitoring: Secondary | ICD-10-CM | POA: Diagnosis not present

## 2015-04-09 DIAGNOSIS — I4891 Unspecified atrial fibrillation: Secondary | ICD-10-CM

## 2015-04-09 DIAGNOSIS — Z954 Presence of other heart-valve replacement: Secondary | ICD-10-CM

## 2015-04-09 DIAGNOSIS — Z952 Presence of prosthetic heart valve: Secondary | ICD-10-CM

## 2015-04-09 LAB — POCT INR: INR: 1.9

## 2015-04-10 ENCOUNTER — Telehealth: Payer: Self-pay | Admitting: Family Medicine

## 2015-04-10 NOTE — Telephone Encounter (Signed)
Pt has a chest cold and coughing up stuff. She said she has old RX of keflex and is asking if she can take it. Please call her at [phone].

## 2015-04-10 NOTE — Telephone Encounter (Signed)
Called the patient and she has had symptoms since Sunday, no fever and a little SOB.  Did inform of all PCP instructions.  The patient did verbally understand all instructions and ageed to do all.

## 2015-04-10 NOTE — Telephone Encounter (Signed)
How long has she had symptoms? Any fever or SOB? If symptoms less than a week and no other symptoms recommend  increased rest and hydration, add probiotics, zinc such as Coldeze or Xicam. Treat fevers as needed, vitamin C 500 to 1000 MG daily and Elderly liquid or caps and consider antibiotics only if symptoms worsen or do not improve in next week. Keflex would be an OK choice if we need onel

## 2015-04-12 ENCOUNTER — Telehealth: Payer: Self-pay | Admitting: Family Medicine

## 2015-04-12 NOTE — Telephone Encounter (Signed)
Called patient to discuss symptoms further. Left message for return call.

## 2015-04-12 NOTE — Telephone Encounter (Signed)
Patient returned call. States she has productive cough with yellowish-green mucus as well as wheezing. Patient states she feels it is her lung. Appointment scheduled for patient with M. Edwena Blow.

## 2015-04-12 NOTE — Telephone Encounter (Signed)
Relation to [id number] Call back number: [phone] Pharmacy: [place], [place] - [address] [phone] (Fax)         Reason for call:  Patient is coughing yellow mucus, patient states she feels in her right lung congested, wheezing. Patient states spouse has car and can not schedule appointment.

## 2015-04-13 ENCOUNTER — Encounter: Payer: Self-pay | Admitting: Family

## 2015-04-13 ENCOUNTER — Ambulatory Visit (HOSPITAL_BASED_OUTPATIENT_CLINIC_OR_DEPARTMENT_OTHER)
Admission: RE | Admit: 2015-04-13 | Discharge: 2015-04-13 | Disposition: A | Discharge status: Home / Self Care | Payer: 59 | Source: Ambulatory Visit | Attending: Family | Admitting: Family

## 2015-04-13 ENCOUNTER — Ambulatory Visit (INDEPENDENT_AMBULATORY_CARE_PROVIDER_SITE_OTHER): Payer: 59 | Admitting: Family

## 2015-04-13 ENCOUNTER — Telehealth: Payer: Self-pay | Admitting: Family

## 2015-04-13 VITALS — BP 130/74 | HR 78 | Temp 99.9°F | Resp 18 | Ht 68.0 in | Wt 254.0 lb

## 2015-04-13 DIAGNOSIS — R739 Hyperglycemia, unspecified: Secondary | ICD-10-CM

## 2015-04-13 DIAGNOSIS — R0602 Shortness of breath: Secondary | ICD-10-CM | POA: Insufficient documentation

## 2015-04-13 DIAGNOSIS — J209 Acute bronchitis, unspecified: Secondary | ICD-10-CM | POA: Diagnosis not present

## 2015-04-13 DIAGNOSIS — R05 Cough: Secondary | ICD-10-CM | POA: Diagnosis present

## 2015-04-13 DIAGNOSIS — R0989 Other specified symptoms and signs involving the circulatory and respiratory systems: Secondary | ICD-10-CM | POA: Insufficient documentation

## 2015-04-13 DIAGNOSIS — R059 Cough, unspecified: Secondary | ICD-10-CM

## 2015-04-13 LAB — POCT INFLUENZA A/B: Influenza A+B Virus Ag-Direct(Rapid): NEGATIVE

## 2015-04-13 MED ORDER — DOXYCYCLINE HYCLATE 100 MG PO TABS: 100.0000 mg | ORAL_TABLET | Freq: Two times a day (BID) | ORAL | Status: DC

## 2015-04-13 MED ORDER — PREDNISONE 10 MG PO TABS
ORAL_TABLET | ORAL | Status: DC
Start: 2015-04-13 — End: 2015-04-24

## 2015-04-13 MED ORDER — BENZONATATE 100 MG PO CAPS: 100.0000 mg | ORAL_CAPSULE | Freq: Three times a day (TID) | ORAL | Status: DC | PRN

## 2015-04-13 MED ORDER — ALBUTEROL SULFATE (2.5 MG/3ML) 0.083% IN NEBU
2.5000 mg | INHALATION_SOLUTION | Freq: Once | RESPIRATORY_TRACT | Status: AC
  Administered 2015-04-13: 2.5 mg via RESPIRATORY_TRACT

## 2015-04-13 MED ORDER — ALBUTEROL SULFATE HFA 108 (90 BASE) MCG/ACT IN AERS: 2.0000 | INHALATION_SPRAY | Freq: Four times a day (QID) | RESPIRATORY_TRACT | Status: DC | PRN

## 2015-04-13 NOTE — Progress Notes (Signed)
Pre visit review using our clinic review tool, if applicable. No additional management support is needed unless otherwise documented below in the visit note. 

## 2015-04-13 NOTE — Patient Instructions (Addendum)
Complete lab work prior to leaving.  Please complete x ray on the first floor. Start antibiotics (doxycycline) and prednisone taper. Use albuterol every 6 hours for wheezing and shortness of breath. You may use tessalon as needed for cough. Call if increased shortness of breath, if fever >101, or if symptoms are not improved in 2-3 days.

## 2015-04-13 NOTE — Telephone Encounter (Addendum)
FYI, I am starting this patient on doxycycline.  Could you please advise re: need for coumadin adjustment and/or follow up visit?

## 2015-04-13 NOTE — Telephone Encounter (Signed)
Last INR on 11/14 was subtherapeutic.  Doxycycline usually doesn't result in a large increase in INR.  Will not adjust dose.  Pt has follow up planned for 11/28

## 2015-04-13 NOTE — Progress Notes (Signed)
Subjective:    Patient ID: [name], female    DOB: 11/03/1950, 63 y.o.   MRN: [id number]  HPI  [name] is a 64 yr old female who presents today with chief complaint of cough. Cough began 11/13 and is associated with wheezing, sob, low grade temp 100 and fatigue. Cough is productive at times of green/yellow phlegm.    Started keflex last week which she got from her husband.   Review of Systems See HPI  Past Medical History  Diagnosis Date  . Anemia   . Anxiety   . Depression   . Diverticulosis   . Elevated cholesterol   . Obesity   . THYROMEGALY 07/22/2010  . Overweight(278.02) 03/11/2010  . Mixed hyperlipidemia 03/11/2010  . MEASLES, HX OF 03/11/2010  . KNEE PAIN, RIGHT 05/28/2010  . HYPERTRIGLYCERIDEMIA 03/11/2010  . HEART MURMUR, HX OF 03/11/2010  . FIBROIDS, UTERUS 03/11/2010  . Diarrhea 08/05/2010  . CONSTIPATION 03/11/2010  . CHICKENPOX, HX OF 03/11/2010  . BIPOLAR AFFECTIVE DISORDER 03/11/2010  . AORTIC VALVE REPLACEMENT, HX OF 03/11/2010  . ANEMIA 05/28/2010  . Acute bronchitis 07/22/2010  . Abdominal pain, generalized 03/19/2010  . Peripheral edema 11/18/2010  . Bacterial vaginitis 11/18/2010  . Hematuria 12/18/2010  . Diverticulitis 12/18/2010  . Hyperthyroidism 12/18/2010  . Fatigue 12/30/2010  . Renal insufficiency 12/30/2010  . Grave's disease 8-12  . Arm lesion 03/23/2011  . UTI (lower urinary tract infection) 08/14/2011  . Breast pain in female 11/17/2011  . Chest pain 11/17/2011  . Hematoma 12/18/2011  . Cervical cancer screening 03/22/2012  . Foot pain, left 03/22/2012  . Foot fracture, left 03/22/2012  . Tick bite of back 10/15/2012  . Preop examination 02/03/2013  . Dysrhythmia   . Hypertension   . Shortness of breath     with exertion   . Heart murmur   . Urinary tract bacterial infections   . Arthritis     Social History   Social History  . Marital Status: Married    Spouse Name: N/A  . Number of Children: N/A  . Years of Education: N/A     Occupational History  . Not on file.   Social History Main Topics  . Smoking status: Former Smoker    Quit date: 05/26/1990  . Smokeless tobacco: Never Used  . Alcohol Use: 0.0 oz/week    0 drink(s) per week     Comment: rare  . Drug Use: No  . Sexual Activity: No   Other Topics Concern  . Not on file   Social History Narrative    Past Surgical History  Procedure Laterality Date  . Appendectomy      Required revision for EColi infection, required recurrent packing  . Bowel obstruction      Requiring adhesions to be removed  . Aortic valve replacement    . Open heart with a cardiac aneurysm repair    . Cholecystectomy    . Tubal ligation    . Varicose vein surgery b/l legs    . Childhood exploratory surgery of heart    . Hernia repair  08-16-10  . Abdominal hysterectomy      sb/l spo, total  . Total knee arthroplasty Right 02/24/2013    Procedure: RIGHT TOTAL KNEE ARTHROPLASTY;  Surgeon: Johnn Hai, MD;  Location: [facility];  Service: Orthopedics;  Laterality: Right;  . Left heart catheterization with coronary angiogram N/A 12/05/2011    Procedure: LEFT HEART CATHETERIZATION WITH CORONARY ANGIOGRAM;  Surgeon:  Sinclair Grooms, MD;  Location: [facility] CATH LAB;  Service: Cardiovascular;  Laterality: N/A;    Family History  Problem Relation Age of Onset  . Scoliosis Mother   . Arthritis Mother     Rheumatoid  . Aneurysm Mother     heart  . Alcohol abuse Father   . Depression Sister   . Depression Brother   . Alcohol abuse Brother   . Cancer Maternal Grandmother     colon  . Diabetes Maternal Grandfather   . Heart disease Maternal Grandfather   . Alcohol abuse Maternal Grandfather   . Depression Paternal Grandmother   . Diabetes Paternal Grandmother   . Depression Paternal Grandfather   . Diabetes Paternal Grandfather   . Depression Sister   . Alcohol abuse Brother   . Depression Brother   . Heart disease Brother     Allergies  Allergen Reactions  .  Sulfonamide Derivatives     headaches    Current Outpatient Prescriptions on File Prior to Visit  Medication Sig Dispense Refill  . albuterol (PROVENTIL HFA;VENTOLIN HFA) 108 (90 BASE) MCG/ACT inhaler Inhale 2 puffs into the lungs every 6 (six) hours as needed for wheezing or shortness of breath. 1 Inhaler 0  . B Complex-C (B-COMPLEX WITH VITAMIN C) tablet Take 1 tablet by mouth daily.    . Cholecalciferol (VITAMIN D) 2000 UNITS tablet Take 2,000 Units by mouth daily.    . clonazePAM (KLONOPIN) 0.5 MG tablet Take 0.5 mg by mouth 2 (two) times daily as needed for anxiety.    . cyclobenzaprine (FLEXERIL) 10 MG tablet Take 1 tablet (10 mg total) by mouth 3 (three) times daily as needed for muscle spasms. 30 tablet 0  . fenofibrate 160 MG tablet Take 1 tablet (160 mg total) by mouth daily. 30 tablet 6  . fluticasone (FLONASE) 50 MCG/ACT nasal spray Place 2 sprays into both nostrils daily. 16 g 6  . furosemide (LASIX) 20 MG tablet TAKE 1 TABLET (20 MG TOTAL) BY MOUTH EVERY MORNING. 90 tablet 1  . HYDROcodone-acetaminophen (NORCO) 5-325 MG per tablet Take 1 tablet by mouth every 6 (six) hours as needed for moderate pain. 10 tablet 0  . Krill Oil 300 MG CAPS Take 300 mg by mouth daily.    Marland Kitchen lamoTRIgine (LAMICTAL) 200 MG tablet Take 200 mg by mouth at bedtime.    . metoprolol succinate (TOPROL-XL) 25 MG 24 hr tablet TAKE 1 TABLET (25 MG TOTAL) BY MOUTH EVERY MORNING. 30 tablet 4  . Multiple Vitamin (MULTIVITAMIN WITH MINERALS) TABS tablet Take 1 tablet by mouth daily.    . pantoprazole (PROTONIX) 40 MG tablet TAKE 1 TABLET (40 MG TOTAL) BY MOUTH DAILY. 30 tablet 5  . predniSONE (DELTASONE) 20 MG tablet Take 1 tablet (20 mg total) by mouth daily with breakfast. 10 tablet 0  . Probiotic Product (ALIGN) 4 MG CAPS Take 1 capsule by mouth daily. 30 capsule 0  . Propylene Glycol (SYSTANE BALANCE) 0.6 % SOLN Place 1 drop into both eyes at bedtime.     Marland Kitchen QUEtiapine (SEROQUEL) 200 MG tablet Take 400 mg by  mouth at bedtime. 2- 200mg  tablets at bedtime    . rosuvastatin (CRESTOR) 40 MG tablet Take 1 tablet (40 mg total) by mouth daily. 30 tablet 3  . venlafaxine XR (EFFEXOR-XR) 150 MG 24 hr capsule Take 150 mg by mouth every morning.     . vitamin C (ASCORBIC ACID) 500 MG tablet Take 500 mg by mouth daily.    Marland Kitchen  warfarin (COUMADIN) 5 MG tablet TAKE AS DIRECTED BY ANTICOAGULATION CLINIC 35 tablet 3  . [DISCONTINUED] methimazole (TAPAZOLE) 5 MG tablet Take 1 tablet (5 mg total) by mouth 3 (three) times daily. 30 tablet 2   No current facility-administered medications on file prior to visit.    BP 130/74 mmHg  Pulse 78  Temp(Src) 99.9 F (37.7 C) (Oral)  Resp 18  Ht 5\' 8"  (1.727 m)  Wt 254 lb (115.214 kg)  BMI 38.63 kg/m2  SpO2 97%       Objective:   Physical Exam  Constitutional: She is oriented to person, place, and time. She appears well-developed and well-nourished.  HENT:  Head: Normocephalic and atraumatic.  Right Ear: Tympanic membrane and ear canal normal.  Left Ear: Tympanic membrane and ear canal normal.  Mouth/Throat: No oropharyngeal exudate, posterior oropharyngeal edema or posterior oropharyngeal erythema.  Cardiovascular: Normal rate, regular rhythm and normal heart sounds.   No murmur heard. Pulmonary/Chest: Effort normal. No respiratory distress. She has wheezes.  Lymphadenopathy:    She has no cervical adenopathy.  Neurological: She is alert and oriented to person, place, and time.  Skin: Skin is warm and dry.  Psychiatric: She has a normal mood and affect. Her behavior is normal. Judgment and thought content normal.          Assessment & Plan:  Bronchitis with bronchospasm-CXR is performed and is negative for pneumonia. Will rx with steroid taper, doxycycline, albuterol prn, tessalon for cough. Neb given today in the room.  Hyperglycemia- She did express concern re: elevated blood sugar last visit (219) Lab Results  Component Value Date   HGBA1C 5.7  01/11/2015   A1C was normal over the summer. Elevated blood sugar was drawn while on steroids.  Pt declines follow up A1C today.

## 2015-04-15 DIAGNOSIS — M25559 Pain in unspecified hip: Secondary | ICD-10-CM | POA: Insufficient documentation

## 2015-04-15 DIAGNOSIS — N760 Acute vaginitis: Secondary | ICD-10-CM | POA: Insufficient documentation

## 2015-04-15 DIAGNOSIS — Z1239 Encounter for other screening for malignant neoplasm of breast: Secondary | ICD-10-CM | POA: Insufficient documentation

## 2015-04-15 NOTE — Assessment & Plan Note (Signed)
Well controlled, no changes to meds. Encouraged heart healthy diet such as the DASH diet and exercise as tolerated.  °

## 2015-04-15 NOTE — Progress Notes (Signed)
Subjective:    Patient ID: [name], female    DOB: 1950-10-18, 64 y.o.   MRN: [id number]  Chief Complaint  Patient presents with  . Follow-up    HPI Patient is in today for follow up. Is complaining of some left hip pain which started a couple of days ago. She denies any falls or injuries. She would denies any radicular symptoms. No increased back or abdominal pain. No fevers or chills. No recent illness or acute concerns. Denies CP/palp/SOB/HA/congestion/fevers/GI or GU c/o. Taking meds as prescribed. Complains some scant vaginal discharge no sores or pain.  Past Medical History  Diagnosis Date  . Anemia   . Anxiety   . Depression   . Diverticulosis   . Elevated cholesterol   . Obesity   . THYROMEGALY 07/22/2010  . Overweight(278.02) 03/11/2010  . Mixed hyperlipidemia 03/11/2010  . MEASLES, HX OF 03/11/2010  . KNEE PAIN, RIGHT 05/28/2010  . HYPERTRIGLYCERIDEMIA 03/11/2010  . HEART MURMUR, HX OF 03/11/2010  . FIBROIDS, UTERUS 03/11/2010  . Diarrhea 08/05/2010  . CONSTIPATION 03/11/2010  . CHICKENPOX, HX OF 03/11/2010  . BIPOLAR AFFECTIVE DISORDER 03/11/2010  . AORTIC VALVE REPLACEMENT, HX OF 03/11/2010  . ANEMIA 05/28/2010  . Acute bronchitis 07/22/2010  . Abdominal pain, generalized 03/19/2010  . Peripheral edema 11/18/2010  . Bacterial vaginitis 11/18/2010  . Hematuria 12/18/2010  . Diverticulitis 12/18/2010  . Hyperthyroidism 12/18/2010  . Fatigue 12/30/2010  . Renal insufficiency 12/30/2010  . Grave's disease 8-12  . Arm lesion 03/23/2011  . UTI (lower urinary tract infection) 08/14/2011  . Breast pain in female 11/17/2011  . Chest pain 11/17/2011  . Hematoma 12/18/2011  . Cervical cancer screening 03/22/2012  . Foot pain, left 03/22/2012  . Foot fracture, left 03/22/2012  . Tick bite of back 10/15/2012  . Preop examination 02/03/2013  . Dysrhythmia   . Hypertension   . Shortness of breath     with exertion   . Heart murmur   . Urinary tract bacterial infections   .  Arthritis     Past Surgical History  Procedure Laterality Date  . Appendectomy      Required revision for EColi infection, required recurrent packing  . Bowel obstruction      Requiring adhesions to be removed  . Aortic valve replacement    . Open heart with a cardiac aneurysm repair    . Cholecystectomy    . Tubal ligation    . Varicose vein surgery b/l legs    . Childhood exploratory surgery of heart    . Hernia repair  08-16-10  . Abdominal hysterectomy      sb/l spo, total  . Total knee arthroplasty Right 02/24/2013    Procedure: RIGHT TOTAL KNEE ARTHROPLASTY;  Surgeon: Johnn Hai, MD;  Location: [facility];  Service: Orthopedics;  Laterality: Right;  . Left heart catheterization with coronary angiogram N/A 12/05/2011    Procedure: LEFT HEART CATHETERIZATION WITH CORONARY ANGIOGRAM;  Surgeon: Sinclair Grooms, MD;  Location: [facility];  Service: Cardiovascular;  Laterality: N/A;    Family History  Problem Relation Age of Onset  . Scoliosis Mother   . Arthritis Mother     Rheumatoid  . Aneurysm Mother     heart  . Alcohol abuse Father   . Depression Sister   . Depression Brother   . Alcohol abuse Brother   . Cancer Maternal Grandmother     colon  . Diabetes Maternal Grandfather   . Heart  disease Maternal Grandfather   . Alcohol abuse Maternal Grandfather   . Depression Paternal Grandmother   . Diabetes Paternal Grandmother   . Depression Paternal Grandfather   . Diabetes Paternal Grandfather   . Depression Sister   . Alcohol abuse Brother   . Depression Brother   . Heart disease Brother     Social History   Social History  . Marital Status: Married    Spouse Name: N/A  . Number of Children: N/A  . Years of Education: N/A   Occupational History  . Not on file.   Social History Main Topics  . Smoking status: Former Smoker    Quit date: 05/26/1990  . Smokeless tobacco: Never Used  . Alcohol Use: 0.0 oz/week    0 drink(s) per week     Comment: rare    . Drug Use: No  . Sexual Activity: No   Other Topics Concern  . Not on file   Social History Narrative    Outpatient Prescriptions Prior to Visit  Medication Sig Dispense Refill  . B Complex-C (B-COMPLEX WITH VITAMIN C) tablet Take 1 tablet by mouth daily.    . Cholecalciferol (VITAMIN D) 2000 UNITS tablet Take 2,000 Units by mouth daily.    . fenofibrate 160 MG tablet Take 1 tablet (160 mg total) by mouth daily. 30 tablet 6  . fluticasone (FLONASE) 50 MCG/ACT nasal spray Place 2 sprays into both nostrils daily. 16 g 6  . furosemide (LASIX) 20 MG tablet TAKE 1 TABLET (20 MG TOTAL) BY MOUTH EVERY MORNING. 90 tablet 1  . HYDROcodone-acetaminophen (NORCO) 5-325 MG per tablet Take 1 tablet by mouth every 6 (six) hours as needed for moderate pain. 10 tablet 0  . Krill Oil 300 MG CAPS Take 300 mg by mouth daily.    Marland Kitchen lamoTRIgine (LAMICTAL) 200 MG tablet Take 200 mg by mouth at bedtime.    . metoprolol succinate (TOPROL-XL) 25 MG 24 hr tablet TAKE 1 TABLET (25 MG TOTAL) BY MOUTH EVERY MORNING. 30 tablet 4  . Multiple Vitamin (MULTIVITAMIN WITH MINERALS) TABS tablet Take 1 tablet by mouth daily.    . pantoprazole (PROTONIX) 40 MG tablet TAKE 1 TABLET (40 MG TOTAL) BY MOUTH DAILY. 30 tablet 5  . Probiotic Product (ALIGN) 4 MG CAPS Take 1 capsule by mouth daily. 30 capsule 0  . Propylene Glycol (SYSTANE BALANCE) 0.6 % SOLN Place 1 drop into both eyes at bedtime.     Marland Kitchen QUEtiapine (SEROQUEL) 200 MG tablet Take 400 mg by mouth at bedtime. 2- 200mg  tablets at bedtime    . venlafaxine XR (EFFEXOR-XR) 150 MG 24 hr capsule Take 150 mg by mouth every morning.     . vitamin C (ASCORBIC ACID) 500 MG tablet Take 500 mg by mouth daily.    Marland Kitchen warfarin (COUMADIN) 5 MG tablet TAKE AS DIRECTED BY ANTICOAGULATION CLINIC 35 tablet 3  . pravastatin (PRAVACHOL) 40 MG tablet Take 1 tablet (40 mg total) by mouth at bedtime. 30 tablet 11  . albuterol (PROVENTIL HFA;VENTOLIN HFA) 108 (90 BASE) MCG/ACT inhaler Inhale 2  puffs into the lungs every 6 (six) hours as needed for wheezing or shortness of breath. 1 Inhaler 0  . cephALEXin (KEFLEX) 500 MG capsule Take 1 capsule (500 mg total) by mouth 2 (two) times daily. 14 capsule 0   No facility-administered medications prior to visit.    Allergies  Allergen Reactions  . Sulfonamide Derivatives     headaches    Review of  Systems  Constitutional: Negative for fever and malaise/fatigue.  HENT: Negative for congestion.   Eyes: Negative for discharge.  Respiratory: Negative for shortness of breath.   Cardiovascular: Negative for chest pain, palpitations and leg swelling.  Gastrointestinal: Negative for nausea and abdominal pain.  Genitourinary: Negative for dysuria.  Musculoskeletal: Negative for falls.  Skin: Negative for rash.  Neurological: Negative for loss of consciousness and headaches.  Endo/Heme/Allergies: Negative for environmental allergies.  Psychiatric/Behavioral: Negative for depression. The patient is not nervous/anxious.        Objective:    Physical Exam  Constitutional: She is oriented to person, place, and time. She appears well-developed and well-nourished. No distress.  HENT:  Head: Normocephalic and atraumatic.  Nose: Nose normal.  Eyes: Right eye exhibits no discharge. Left eye exhibits no discharge.  Neck: Normal range of motion. Neck supple.  Cardiovascular: Normal rate.   Murmur heard. Irregularly irregular, mechanical click  Pulmonary/Chest: Effort normal and breath sounds normal.  Abdominal: Soft. Bowel sounds are normal. There is no tenderness.  Musculoskeletal: She exhibits no edema.  Neurological: She is alert and oriented to person, place, and time.  Skin: Skin is warm and dry.  Psychiatric: She has a normal mood and affect.  Nursing note and vitals reviewed.   BP 114/82 mmHg  Pulse 90  Temp(Src) 97.6 F (36.4 C) (Oral)  Ht 5\' 8"  (1.727 m)  Wt 257 lb 6 oz (116.745 kg)  BMI 39.14 kg/m2  SpO2 97% Wt  Readings from Last 3 Encounters:  04/13/15 254 lb (115.214 kg)  04/03/15 257 lb 6 oz (116.745 kg)  12/18/14 252 lb (114.306 kg)     Lab Results  Component Value Date   WBC 6.6 04/03/2015   HGB 13.9 04/03/2015   HCT 41.5 04/03/2015   PLT 224.0 04/03/2015   GLUCOSE 219* 04/03/2015   CHOL 245* 04/03/2015   TRIG * 04/03/2015    667.0 Triglyceride is over 400; calculations on Lipids are invalid.   HDL 46.00 04/03/2015   LDLDIRECT 130.0 04/03/2015   LDLCALC 71 04/04/2013   ALT 32 04/03/2015   AST 23 04/03/2015   NA 138 04/03/2015   K 3.8 04/03/2015   CL 101 04/03/2015   CREATININE 0.88 04/03/2015   BUN 18 04/03/2015   CO2 26 04/03/2015   TSH 3.38 04/03/2015   INR 1.9 04/09/2015   HGBA1C 5.7 01/11/2015    Lab Results  Component Value Date   TSH 3.38 04/03/2015   Lab Results  Component Value Date   WBC 6.6 04/03/2015   HGB 13.9 04/03/2015   HCT 41.5 04/03/2015   MCV 89.2 04/03/2015   PLT 224.0 04/03/2015   Lab Results  Component Value Date   NA 138 04/03/2015   K 3.8 04/03/2015   CO2 26 04/03/2015   GLUCOSE 219* 04/03/2015   BUN 18 04/03/2015   CREATININE 0.88 04/03/2015   BILITOT 0.4 04/03/2015   ALKPHOS 69 04/03/2015   AST 23 04/03/2015   ALT 32 04/03/2015   PROT 7.2 04/03/2015   ALBUMIN 4.3 04/03/2015   CALCIUM 9.5 04/03/2015   ANIONGAP 11 04/06/2014   GFR 68.68 04/03/2015   Lab Results  Component Value Date   CHOL 245* 04/03/2015   Lab Results  Component Value Date   HDL 46.00 04/03/2015   Lab Results  Component Value Date   LDLCALC 71 04/04/2013   Lab Results  Component Value Date   TRIG * 04/03/2015    667.0 Triglyceride is over 400; calculations on  Lipids are invalid.   Lab Results  Component Value Date   CHOLHDL 5 04/03/2015   Lab Results  Component Value Date   HGBA1C 5.7 01/11/2015       Assessment & Plan:   Problem List Items Addressed This Visit    Atrial fibrillation (Ong)   Relevant Orders   Urinalysis (Completed)    Urine Culture (Completed)   Urine cytology ancillary only (Completed)   TSH (Completed)   CBC (Completed)   Lipid panel (Completed)   Comprehensive metabolic panel (Completed)   MM Digital Screening (Completed)   Breast cancer screening    MGM ordered      Relevant Orders   Urinalysis (Completed)   Urine Culture (Completed)   Urine cytology ancillary only (Completed)   TSH (Completed)   CBC (Completed)   Lipid panel (Completed)   Comprehensive metabolic panel (Completed)   MM Digital Screening (Completed)   Essential hypertension, benign    Well controlled, no changes to meds. Encouraged heart healthy diet such as the DASH diet and exercise as tolerated.       Hip pain, acute    Xray unremarkable. Encouraged moist heat and gentle stretching as tolerated. May try NSAIDs and prescription meds as directed and report if symptoms worsen or seek immediate care. Consider topical treatments      Relevant Medications   cyclobenzaprine (FLEXERIL) 10 MG tablet   Other Relevant Orders   DG HIP UNILAT W OR W/O PELVIS 2-3 VIEWS LEFT (Completed)   Hx of mechanical aortic valve replacement    Tolerating coumadin rate controlled      Hyperthyroidism   Relevant Orders   Urinalysis (Completed)   Urine Culture (Completed)   Urine cytology ancillary only (Completed)   TSH (Completed)   CBC (Completed)   Lipid panel (Completed)   Comprehensive metabolic panel (Completed)   MM Digital Screening (Completed)   MIXED HYPERLIPIDEMIA   Relevant Orders   Urinalysis (Completed)   Urine Culture (Completed)   Urine cytology ancillary only (Completed)   TSH (Completed)   CBC (Completed)   Lipid panel (Completed)   Comprehensive metabolic panel (Completed)   MM Digital Screening (Completed)   Overweight    Encouraged DASH diet, decrease po intake and increase exercise as tolerated. Needs 7-8 hours of sleep nightly. Avoid trans fats, eat small, frequent meals every 4-5 hours with lean proteins,  complex carbs and healthy fats. Minimize simple carbs,      Recurrent UTI    Urine culture negative, encouraged adequate hdyration      Relevant Orders   Urinalysis (Completed)   Urine Culture (Completed)   Urine cytology ancillary only (Completed)   TSH (Completed)   CBC (Completed)   Lipid panel (Completed)   Comprehensive metabolic panel (Completed)   MM Digital Screening (Completed)   Vaginitis and vulvovaginitis    Ancillary testing unremarkable, encouraged cotton undergarments, start a probiotic and report if no improvement      Relevant Orders   Urinalysis (Completed)   Urine Culture (Completed)   Urine cytology ancillary only (Completed)   TSH (Completed)   CBC (Completed)   Lipid panel (Completed)   Comprehensive metabolic panel (Completed)   MM Digital Screening (Completed)    Other Visit Diagnoses    Encounter for immunization    -  Primary    Urinary tract infection without hematuria, site unspecified        Relevant Orders    Urinalysis (Completed)    Urine Culture (Completed)  Urine cytology ancillary only (Completed)    TSH (Completed)    CBC (Completed)    Lipid panel (Completed)    Comprehensive metabolic panel (Completed)    MM Digital Screening (Completed)       I have discontinued Ms. Wolak's cephALEXin. I am also having her start on cyclobenzaprine. Additionally, I am having her maintain her QUEtiapine, ALIGN, venlafaxine XR, B-complex with vitamin C, Propylene Glycol, lamoTRIgine, Krill Oil, multivitamin with minerals, Vitamin D, vitamin C, fenofibrate, fluticasone, HYDROcodone-acetaminophen, warfarin, furosemide, pantoprazole, metoprolol succinate, and clonazePAM.  Meds ordered this encounter  Medications  . clonazePAM (KLONOPIN) 0.5 MG tablet    Sig: Take 0.5 mg by mouth 2 (two) times daily as needed for anxiety.  Marland Kitchen DISCONTD: predniSONE (DELTASONE) 20 MG tablet    Sig: Take 1 tablet (20 mg total) by mouth daily with breakfast.     Dispense:  10 tablet    Refill:  0  . cyclobenzaprine (FLEXERIL) 10 MG tablet    Sig: Take 1 tablet (10 mg total) by mouth 3 (three) times daily as needed for muscle spasms.    Dispense:  30 tablet    Refill:  0     Penni Homans, MD

## 2015-04-15 NOTE — Assessment & Plan Note (Signed)
Urine culture negative, encouraged adequate hdyration

## 2015-04-15 NOTE — Assessment & Plan Note (Signed)
Ancillary testing unremarkable, encouraged cotton undergarments, start a probiotic and report if no improvement

## 2015-04-15 NOTE — Assessment & Plan Note (Signed)
Tolerating coumadin rate controlled 

## 2015-04-15 NOTE — Assessment & Plan Note (Signed)
Encouraged DASH diet, decrease po intake and increase exercise as tolerated. Needs 7-8 hours of sleep nightly. Avoid trans fats, eat small, frequent meals every 4-5 hours with lean proteins, complex carbs and healthy fats. Minimize simple carbs, 

## 2015-04-15 NOTE — Assessment & Plan Note (Signed)
Xray unremarkable. Encouraged moist heat and gentle stretching as tolerated. May try NSAIDs and prescription meds as directed and report if symptoms worsen or seek immediate care. Consider topical treatments

## 2015-04-15 NOTE — Assessment & Plan Note (Signed)
MGM ordered 

## 2015-04-16 ENCOUNTER — Telehealth: Payer: Self-pay | Admitting: Family Medicine

## 2015-04-16 NOTE — Telephone Encounter (Signed)
Caller name: Self   Can be reached: [phone]  Reason for call: Still coughing and wheezing. States Dr. Charlett Blake wanted to know how she was doing

## 2015-04-16 NOTE — Telephone Encounter (Signed)
Notify her to start Mucinex bid x 10 days then rx Tessalon perles 100 mg caps 1 cap po tid prn cough, disp #30 and Amoxicillin 500 mg tabs 1 tab po tid x 7 days

## 2015-04-17 NOTE — Telephone Encounter (Signed)
Called the patient informed of PCP instructions 

## 2015-04-17 NOTE — Telephone Encounter (Signed)
Spoke to the patient. She was seen on 04/13/15 by Debbrah Alar. She is currently on an antibiotic and the cough pills.  She does not need the cough pills any more, But concerned about the wheezing.  She also is using an inhaler.

## 2015-04-17 NOTE — Telephone Encounter (Signed)
Use the albuterol 3 x daily and as needed for next 3 days, if wheezing gets worse then needs to be seen

## 2015-04-23 ENCOUNTER — Ambulatory Visit (INDEPENDENT_AMBULATORY_CARE_PROVIDER_SITE_OTHER): Payer: 59 | Admitting: *Deleted

## 2015-04-23 VITALS — HR 64

## 2015-04-23 DIAGNOSIS — Z954 Presence of other heart-valve replacement: Secondary | ICD-10-CM

## 2015-04-23 DIAGNOSIS — R0789 Other chest pain: Secondary | ICD-10-CM

## 2015-04-23 DIAGNOSIS — I4891 Unspecified atrial fibrillation: Secondary | ICD-10-CM | POA: Diagnosis not present

## 2015-04-23 DIAGNOSIS — Z952 Presence of prosthetic heart valve: Secondary | ICD-10-CM

## 2015-04-23 DIAGNOSIS — Z5181 Encounter for therapeutic drug level monitoring: Secondary | ICD-10-CM

## 2015-04-23 DIAGNOSIS — I359 Nonrheumatic aortic valve disorder, unspecified: Secondary | ICD-10-CM

## 2015-04-23 DIAGNOSIS — I493 Ventricular premature depolarization: Secondary | ICD-10-CM

## 2015-04-23 LAB — POCT INR: INR: 4.5

## 2015-04-23 NOTE — Progress Notes (Signed)
  1.) Reason for visit: Add on from [facility] for chest pressure  2.) Name of MD requesting visit: Ross  3.) H&P: The patient was in [facility] today for coumadin follow up. She has complaints of 2 episodes over the last week of fleeting chest pressure. Her most recent episode was this morning. She has noticed decreased energy levels over the last week. Per the patient's report, she feels like she did prior to her heart surgery / aneurysm repair in 12/09.  4.) ROS related to problem: EKG performed today for patient's complaint of chest discomfort and decreased energy level. While the patient was hooked up to the EKG machine, she was having PVC's to which she would comment "did you see that." She has noticed these for some time. The patient was free of chest discomfort in the office.  5.) Assessment and plan per MD: EKG from today reviewed with Dr. Harrington Challenger- no change from previous. Due to the patient's history of aortic valve replacement and symptoms reported today- orders received for an echo to be done and a 24 hour holter to assess PVC burden. The patient is agreeable.

## 2015-04-23 NOTE — Patient Instructions (Signed)
Medication Instructions: - no changes  Labwork: - none  Procedures/Testing: - Your physician has requested that you have an echocardiogram. Echocardiography is a painless test that uses sound waves to create images of your heart. It provides your doctor with information about the size and shape of your heart and how well your heart's chambers and valves are working. This procedure takes approximately one hour. There are no restrictions for this procedure.  - Your physician has recommended that you wear a 24 hour holter monitor. Holter monitors are medical devices that record the heart's electrical activity. Doctors most often use these monitors to diagnose arrhythmias. Arrhythmias are problems with the speed or rhythm of the heartbeat. The monitor is a small, portable device. You can wear one while you do your normal daily activities. This is usually used to diagnose what is causing palpitations/syncope (passing out).  Follow-Up: - pending results  Any Additional Special Instructions Will Be Listed Below (If Applicable).

## 2015-04-24 ENCOUNTER — Ambulatory Visit (INDEPENDENT_AMBULATORY_CARE_PROVIDER_SITE_OTHER): Payer: 59 | Admitting: Family Medicine

## 2015-04-24 ENCOUNTER — Encounter: Payer: Self-pay | Admitting: Family Medicine

## 2015-04-24 ENCOUNTER — Other Ambulatory Visit: Payer: Self-pay | Admitting: Family Medicine

## 2015-04-24 VITALS — BP 122/78 | HR 86 | Temp 98.6°F | Ht 68.0 in | Wt 257.5 lb

## 2015-04-24 DIAGNOSIS — I10 Essential (primary) hypertension: Secondary | ICD-10-CM

## 2015-04-24 DIAGNOSIS — E782 Mixed hyperlipidemia: Secondary | ICD-10-CM

## 2015-04-24 DIAGNOSIS — I4891 Unspecified atrial fibrillation: Secondary | ICD-10-CM | POA: Diagnosis not present

## 2015-04-24 DIAGNOSIS — E0590 Thyrotoxicosis, unspecified without thyrotoxic crisis or storm: Secondary | ICD-10-CM

## 2015-04-24 DIAGNOSIS — E119 Type 2 diabetes mellitus without complications: Secondary | ICD-10-CM

## 2015-04-24 DIAGNOSIS — R739 Hyperglycemia, unspecified: Secondary | ICD-10-CM

## 2015-04-24 LAB — HEMOGLOBIN A1C: Hgb A1c MFr Bld: 6.5 % (ref 4.6–6.5)

## 2015-04-24 NOTE — Assessment & Plan Note (Signed)
Well controlled, no changes to meds. Encouraged heart healthy diet such as the DASH diet and exercise as tolerated.  °

## 2015-04-24 NOTE — Progress Notes (Signed)
Pre visit review using our clinic review tool, if applicable. No additional management support is needed unless otherwise documented below in the visit note. 

## 2015-04-24 NOTE — Patient Instructions (Signed)

## 2015-04-25 ENCOUNTER — Ambulatory Visit: Payer: 59 | Admitting: Family Medicine

## 2015-04-26 ENCOUNTER — Other Ambulatory Visit: Payer: Self-pay | Admitting: Family Medicine

## 2015-04-26 MED ORDER — ONETOUCH LANCETS MISC: Status: DC

## 2015-04-26 MED ORDER — GLUCOSE BLOOD VI STRP: ORAL_STRIP | Status: DC

## 2015-04-26 MED ORDER — ONETOUCH BASIC SYSTEM W/DEVICE KIT: PACK | Status: DC

## 2015-04-28 NOTE — Assessment & Plan Note (Signed)
Rate controlled tolerating Coumadin

## 2015-04-28 NOTE — Progress Notes (Signed)
Subjective:    Patient ID: [name], female    DOB: [birth year], 64 y.o.   MRN: [id number]  Chief Complaint  Patient presents with  . Follow-up    HPI Patient is in today for follow-up. She is here today coming by her mother. She's recently had a respiratory illness and needed treatment with steroids and antibiotics and albuterol. Her symptoms have improved since treatment. She still has a mild cough but it is greatly improved. No significant wheezing left. Was having fevers but those are resolved. Denies any GI complaints. Does have occasional palpitations and chest discomfort but thinks it is relating to coughing. Denies CP/palp/SOB/HA/fevers/GI or GU c/o. Taking meds as prescribed  Past Medical History  Diagnosis Date  . Anemia   . Anxiety   . Depression   . Diverticulosis   . Elevated cholesterol   . Obesity   . THYROMEGALY 07/22/2010  . Overweight(278.02) 03/11/2010  . Mixed hyperlipidemia 03/11/2010  . MEASLES, HX OF 03/11/2010  . KNEE PAIN, RIGHT 05/28/2010  . HYPERTRIGLYCERIDEMIA 03/11/2010  . HEART MURMUR, HX OF 03/11/2010  . FIBROIDS, UTERUS 03/11/2010  . Diarrhea 08/05/2010  . CONSTIPATION 03/11/2010  . CHICKENPOX, HX OF 03/11/2010  . BIPOLAR AFFECTIVE DISORDER 03/11/2010  . AORTIC VALVE REPLACEMENT, HX OF 03/11/2010  . ANEMIA 05/28/2010  . Acute bronchitis 07/22/2010  . Abdominal pain, generalized 03/19/2010  . Peripheral edema 11/18/2010  . Bacterial vaginitis 11/18/2010  . Hematuria 12/18/2010  . Diverticulitis 12/18/2010  . Hyperthyroidism 12/18/2010  . Fatigue 12/30/2010  . Renal insufficiency 12/30/2010  . Grave's disease 8-12  . Arm lesion 03/23/2011  . UTI (lower urinary tract infection) 08/14/2011  . Breast pain in female 11/17/2011  . Chest pain 11/17/2011  . Hematoma 12/18/2011  . Cervical cancer screening 03/22/2012  . Foot pain, left 03/22/2012  . Foot fracture, left 03/22/2012  . Tick bite of back 10/15/2012  . Preop examination 02/03/2013  . Dysrhythmia    . Hypertension   . Shortness of breath     with exertion   . Heart murmur   . Urinary tract bacterial infections   . Arthritis     Past Surgical History  Procedure Laterality Date  . Appendectomy      Required revision for EColi infection, required recurrent packing  . Bowel obstruction      Requiring adhesions to be removed  . Aortic valve replacement    . Open heart with a cardiac aneurysm repair    . Cholecystectomy    . Tubal ligation    . Varicose vein surgery b/l legs    . Childhood exploratory surgery of heart    . Hernia repair  08-16-10  . Abdominal hysterectomy      sb/l spo, total  . Total knee arthroplasty Right 02/24/2013    Procedure: RIGHT TOTAL KNEE ARTHROPLASTY;  Surgeon: Johnn Hai, MD;  Location: [facility];  Service: Orthopedics;  Laterality: Right;  . Left heart catheterization with coronary angiogram N/A 12/05/2011    Procedure: LEFT HEART CATHETERIZATION WITH CORONARY ANGIOGRAM;  Surgeon: Sinclair Grooms, MD;  Location: [facility] CATH LAB;  Service: Cardiovascular;  Laterality: N/A;    Family History  Problem Relation Age of Onset  . Scoliosis Mother   . Arthritis Mother     Rheumatoid  . Aneurysm Mother     heart  . Alcohol abuse Father   . Depression Sister   . Depression Brother   . Alcohol abuse Brother   .  Cancer Maternal Grandmother     colon  . Diabetes Maternal Grandfather   . Heart disease Maternal Grandfather   . Alcohol abuse Maternal Grandfather   . Depression Paternal Grandmother   . Diabetes Paternal Grandmother   . Depression Paternal Grandfather   . Diabetes Paternal Grandfather   . Depression Sister   . Alcohol abuse Brother   . Depression Brother   . Heart disease Brother     Social History   Social History  . Marital Status: Married    Spouse Name: N/A  . Number of Children: N/A  . Years of Education: N/A   Occupational History  . Not on file.   Social History Main Topics  . Smoking status: Former Smoker    Quit  date: 05/26/1990  . Smokeless tobacco: Never Used  . Alcohol Use: 0.0 oz/week    0 drink(s) per week     Comment: rare  . Drug Use: No  . Sexual Activity: No   Other Topics Concern  . Not on file   Social History Narrative    Outpatient Prescriptions Prior to Visit  Medication Sig Dispense Refill  . albuterol (PROVENTIL HFA;VENTOLIN HFA) 108 (90 BASE) MCG/ACT inhaler Inhale 2 puffs into the lungs every 6 (six) hours as needed for wheezing or shortness of breath. 1 Inhaler 0  . clonazePAM (KLONOPIN) 0.5 MG tablet Take 0.5 mg by mouth 2 (two) times daily as needed for anxiety.    . cyclobenzaprine (FLEXERIL) 10 MG tablet Take 1 tablet (10 mg total) by mouth 3 (three) times daily as needed for muscle spasms. 30 tablet 0  . furosemide (LASIX) 20 MG tablet TAKE 1 TABLET (20 MG TOTAL) BY MOUTH EVERY MORNING. 90 tablet 1  . HYDROcodone-acetaminophen (NORCO) 5-325 MG per tablet Take 1 tablet by mouth every 6 (six) hours as needed for moderate pain. 10 tablet 0  . Krill Oil 300 MG CAPS Take 300 mg by mouth daily.    Marland Kitchen lamoTRIgine (LAMICTAL) 200 MG tablet Take 200 mg by mouth at bedtime.    . metoprolol succinate (TOPROL-XL) 25 MG 24 hr tablet TAKE 1 TABLET (25 MG TOTAL) BY MOUTH EVERY MORNING. 30 tablet 4  . pantoprazole (PROTONIX) 40 MG tablet TAKE 1 TABLET (40 MG TOTAL) BY MOUTH DAILY. 30 tablet 5  . Probiotic Product (ALIGN) 4 MG CAPS Take 1 capsule by mouth daily. 30 capsule 0  . Propylene Glycol (SYSTANE BALANCE) 0.6 % SOLN Place 1 drop into both eyes at bedtime.     Marland Kitchen QUEtiapine (SEROQUEL) 200 MG tablet Take 400 mg by mouth at bedtime. 2- 200mg  tablets at bedtime    . rosuvastatin (CRESTOR) 40 MG tablet Take 1 tablet (40 mg total) by mouth daily. 30 tablet 3  . vitamin C (ASCORBIC ACID) 500 MG tablet Take 500 mg by mouth daily.    Marland Kitchen warfarin (COUMADIN) 5 MG tablet TAKE AS DIRECTED BY ANTICOAGULATION CLINIC 35 tablet 3  . B Complex-C (B-COMPLEX WITH VITAMIN C) tablet Take 1 tablet by mouth  daily.    . Cholecalciferol (VITAMIN D) 2000 UNITS tablet Take 2,000 Units by mouth daily.    . fenofibrate 160 MG tablet Take 1 tablet (160 mg total) by mouth daily. (Patient not taking: Reported on 04/24/2015) 30 tablet 6  . Multiple Vitamin (MULTIVITAMIN WITH MINERALS) TABS tablet Take 1 tablet by mouth daily.    Marland Kitchen venlafaxine XR (EFFEXOR-XR) 150 MG 24 hr capsule Take 150 mg by mouth every morning.     Marland Kitchen  benzonatate (TESSALON) 100 MG capsule Take 1 capsule (100 mg total) by mouth 3 (three) times daily as needed. 20 capsule 0  . doxycycline (VIBRA-TABS) 100 MG tablet Take 1 tablet (100 mg total) by mouth 2 (two) times daily. 20 tablet 0  . fluticasone (FLONASE) 50 MCG/ACT nasal spray Place 2 sprays into both nostrils daily. 16 g 6  . predniSONE (DELTASONE) 10 MG tablet 4 tabs by mouth once daily x 2 days, then 3 tabs daily x 2 days,then 2 tabs once daily x 2 days, then 1 tab daily x 2 days. 20 tablet 0   No facility-administered medications prior to visit.    Allergies  Allergen Reactions  . Sulfonamide Derivatives     headaches    Review of Systems  Constitutional: Negative for fever and malaise/fatigue.  HENT: Negative for congestion.   Eyes: Negative for discharge.  Respiratory: Negative for shortness of breath.   Cardiovascular: Negative for chest pain, palpitations and leg swelling.  Gastrointestinal: Negative for nausea and abdominal pain.  Genitourinary: Negative for dysuria.  Musculoskeletal: Negative for falls.  Skin: Negative for rash.  Neurological: Negative for loss of consciousness and headaches.  Endo/Heme/Allergies: Negative for environmental allergies.  Psychiatric/Behavioral: Negative for depression. The patient is not nervous/anxious.        Objective:    Physical Exam  Constitutional: She is oriented to person, place, and time. She appears well-developed and well-nourished. No distress.  HENT:  Head: Normocephalic and atraumatic.  Nose: Nose normal.    Eyes: Right eye exhibits no discharge. Left eye exhibits no discharge.  Neck: Normal range of motion. Neck supple.  Cardiovascular: Normal rate.   Murmur heard. click  Pulmonary/Chest: Effort normal and breath sounds normal.  Abdominal: Soft. Bowel sounds are normal. There is no tenderness.  Musculoskeletal: She exhibits no edema.  Neurological: She is alert and oriented to person, place, and time.  Skin: Skin is warm and dry.  Psychiatric: She has a normal mood and affect.  Nursing note and vitals reviewed.   BP 122/78 mmHg  Pulse 86  Temp(Src) 98.6 F (37 C) (Oral)  Ht 5\' 8"  (1.727 m)  Wt 257 lb 8 oz (116.801 kg)  BMI 39.16 kg/m2  SpO2 95% Wt Readings from Last 3 Encounters:  04/24/15 257 lb 8 oz (116.801 kg)  04/13/15 254 lb (115.214 kg)  04/03/15 257 lb 6 oz (116.745 kg)     Lab Results  Component Value Date   WBC 6.6 04/03/2015   HGB 13.9 04/03/2015   HCT 41.5 04/03/2015   PLT 224.0 04/03/2015   GLUCOSE 219* 04/03/2015   CHOL 245* 04/03/2015   TRIG * 04/03/2015    667.0 Triglyceride is over 400; calculations on Lipids are invalid.   HDL 46.00 04/03/2015   LDLDIRECT 130.0 04/03/2015   LDLCALC 71 04/04/2013   ALT 32 04/03/2015   AST 23 04/03/2015   NA 138 04/03/2015   K 3.8 04/03/2015   CL 101 04/03/2015   CREATININE 0.88 04/03/2015   BUN 18 04/03/2015   CO2 26 04/03/2015   TSH 3.38 04/03/2015   INR 4.5 04/23/2015   HGBA1C 6.5 04/24/2015    Lab Results  Component Value Date   TSH 3.38 04/03/2015   Lab Results  Component Value Date   WBC 6.6 04/03/2015   HGB 13.9 04/03/2015   HCT 41.5 04/03/2015   MCV 89.2 04/03/2015   PLT 224.0 04/03/2015   Lab Results  Component Value Date   NA 138 04/03/2015  K 3.8 04/03/2015   CO2 26 04/03/2015   GLUCOSE 219* 04/03/2015   BUN 18 04/03/2015   CREATININE 0.88 04/03/2015   BILITOT 0.4 04/03/2015   ALKPHOS 69 04/03/2015   AST 23 04/03/2015   ALT 32 04/03/2015   PROT 7.2 04/03/2015   ALBUMIN 4.3  04/03/2015   CALCIUM 9.5 04/03/2015   ANIONGAP 11 04/06/2014   GFR 68.68 04/03/2015   Lab Results  Component Value Date   CHOL 245* 04/03/2015   Lab Results  Component Value Date   HDL 46.00 04/03/2015   Lab Results  Component Value Date   LDLCALC 71 04/04/2013   Lab Results  Component Value Date   TRIG * 04/03/2015    667.0 Triglyceride is over 400; calculations on Lipids are invalid.   Lab Results  Component Value Date   CHOLHDL 5 04/03/2015   Lab Results  Component Value Date   HGBA1C 6.5 04/24/2015       Assessment & Plan:   Problem List Items Addressed This Visit    Atrial fibrillation ([place])    Rate controlled tolerating Coumadin      Essential hypertension, benign - Primary    Well controlled, no changes to meds. Encouraged heart healthy diet such as the DASH diet and exercise as tolerated.       Relevant Orders   TSH   CBC   Hemoglobin A1c   Lipid panel   Microalbumin / creatinine urine ratio   Comprehensive metabolic panel   Hyperthyroidism    WNL doing well      MIXED HYPERLIPIDEMIA    Tolerating statin, encouraged heart healthy diet, avoid trans fats, minimize simple carbs and saturated fats. Increase exercise as tolerated       Other Visit Diagnoses    Hyperglycemia        Relevant Orders    Hemoglobin A1c (Completed)    TSH    CBC    Hemoglobin A1c    Lipid panel    Microalbumin / creatinine urine ratio    Comprehensive metabolic panel    Hyperlipidemia, mixed        Relevant Orders    TSH    CBC    Hemoglobin A1c    Lipid panel    Microalbumin / creatinine urine ratio    Comprehensive metabolic panel       I have discontinued Ms. Wieczorek's fluticasone, predniSONE, benzonatate, and doxycycline. I am also having her maintain her QUEtiapine, ALIGN, venlafaxine XR, B-complex with vitamin C, Propylene Glycol, lamoTRIgine, Krill Oil, multivitamin with minerals, Vitamin D, vitamin C, fenofibrate, HYDROcodone-acetaminophen, warfarin,  furosemide, pantoprazole, metoprolol succinate, clonazePAM, cyclobenzaprine, rosuvastatin, and albuterol.  No orders of the defined types were placed in this encounter.     Penni Homans, MD

## 2015-04-28 NOTE — Assessment & Plan Note (Signed)
WNL doing well

## 2015-04-28 NOTE — Assessment & Plan Note (Signed)
Tolerating statin, encouraged heart healthy diet, avoid trans fats, minimize simple carbs and saturated fats. Increase exercise as tolerated 

## 2015-04-30 ENCOUNTER — Other Ambulatory Visit (HOSPITAL_COMMUNITY): Payer: 59

## 2015-05-03 ENCOUNTER — Encounter: Payer: Self-pay | Admitting: Medical

## 2015-05-03 ENCOUNTER — Ambulatory Visit (INDEPENDENT_AMBULATORY_CARE_PROVIDER_SITE_OTHER): Payer: 59 | Admitting: Medical

## 2015-05-03 VITALS — BP 120/70 | HR 67 | Temp 98.0°F | Ht 68.0 in | Wt 255.0 lb

## 2015-05-03 DIAGNOSIS — R300 Dysuria: Secondary | ICD-10-CM | POA: Diagnosis not present

## 2015-05-03 DIAGNOSIS — N390 Urinary tract infection, site not specified: Secondary | ICD-10-CM | POA: Diagnosis not present

## 2015-05-03 DIAGNOSIS — R82998 Other abnormal findings in urine: Secondary | ICD-10-CM

## 2015-05-03 LAB — POCT URINALYSIS DIPSTICK
Bilirubin, UA: NEGATIVE
Glucose, UA: NEGATIVE
Ketones, UA: NEGATIVE
Nitrite, UA: NEGATIVE
Protein, UA: NEGATIVE
Spec Grav, UA: 1.02
Urobilinogen, UA: 0.2
pH, UA: 6

## 2015-05-03 MED ORDER — CEPHALEXIN 500 MG PO CAPS: 500.0000 mg | ORAL_CAPSULE | Freq: Two times a day (BID) | ORAL | Status: DC

## 2015-05-03 MED ORDER — PHENAZOPYRIDINE HCL 200 MG PO TABS: 200.0000 mg | ORAL_TABLET | Freq: Three times a day (TID) | ORAL | Status: DC | PRN

## 2015-05-03 NOTE — Progress Notes (Signed)
Pre visit review using our clinic review tool, if applicable. No additional management support is needed unless otherwise documented below in the visit note. 

## 2015-05-03 NOTE — Patient Instructions (Addendum)
Your appear to have a urinary tract infection. I am prescribing  Cephalexin antibiotic for the probable infection. Hydrate well. I am sending out a urine culture. During the interim if your signs and symptoms worsen rather than improving please notify us. We will notify your when the culture results are back.  Pyridium rx  for urinary pain    Follow up in 7 days or as needed.  Cephalexin good antibiotic in light of coumadin use. When culture comes back may need to switch to other antibiotic.

## 2015-05-03 NOTE — Progress Notes (Signed)
Subjective:    Patient ID: [name], female    DOB: Apr 24, 1951, 64 y.o.   MRN: [id number]  HPI   Pt in today reporting urinary symptoms.  Dysuria- yes today Frequent urination-yes Hesitancy-no Suprapubic pressure-yes Fever-no chills-no Nausea-no Vomiting-no CVA pain-no History of UTI-yes Gross hematuria-   Review of Systems  Constitutional: Negative for fever, chills and fatigue.  Cardiovascular: Negative for chest pain and palpitations.  Genitourinary: Positive for dysuria, urgency and frequency. Negative for flank pain and pelvic pain.  Musculoskeletal: Negative for back pain.  Hematological: Negative for adenopathy.  Psychiatric/Behavioral: Negative for behavioral problems and confusion.    Past Medical History  Diagnosis Date  . Anemia   . Anxiety   . Depression   . Diverticulosis   . Elevated cholesterol   . Obesity   . THYROMEGALY 07/22/2010  . Overweight(278.02) 03/11/2010  . Mixed hyperlipidemia 03/11/2010  . MEASLES, HX OF 03/11/2010  . KNEE PAIN, RIGHT 05/28/2010  . HYPERTRIGLYCERIDEMIA 03/11/2010  . HEART MURMUR, HX OF 03/11/2010  . FIBROIDS, UTERUS 03/11/2010  . Diarrhea 08/05/2010  . CONSTIPATION 03/11/2010  . CHICKENPOX, HX OF 03/11/2010  . BIPOLAR AFFECTIVE DISORDER 03/11/2010  . AORTIC VALVE REPLACEMENT, HX OF 03/11/2010  . ANEMIA 05/28/2010  . Acute bronchitis 07/22/2010  . Abdominal pain, generalized 03/19/2010  . Peripheral edema 11/18/2010  . Bacterial vaginitis 11/18/2010  . Hematuria 12/18/2010  . Diverticulitis 12/18/2010  . Hyperthyroidism 12/18/2010  . Fatigue 12/30/2010  . Renal insufficiency 12/30/2010  . Grave's disease 8-12  . Arm lesion 03/23/2011  . UTI (lower urinary tract infection) 08/14/2011  . Breast pain in female 11/17/2011  . Chest pain 11/17/2011  . Hematoma 12/18/2011  . Cervical cancer screening 03/22/2012  . Foot pain, left 03/22/2012  . Foot fracture, left 03/22/2012  . Tick bite of back 10/15/2012  . Preop  examination 02/03/2013  . Dysrhythmia   . Hypertension   . Shortness of breath     with exertion   . Heart murmur   . Urinary tract bacterial infections   . Arthritis     Social History   Social History  . Marital Status: Married    Spouse Name: N/A  . Number of Children: N/A  . Years of Education: N/A   Occupational History  . Not on file.   Social History Main Topics  . Smoking status: Former Smoker    Quit date: 05/26/1990  . Smokeless tobacco: Never Used  . Alcohol Use: 0.0 oz/week    0 drink(s) per week     Comment: rare  . Drug Use: No  . Sexual Activity: No   Other Topics Concern  . Not on file   Social History Narrative    Past Surgical History  Procedure Laterality Date  . Appendectomy      Required revision for EColi infection, required recurrent packing  . Bowel obstruction      Requiring adhesions to be removed  . Aortic valve replacement    . Open heart with a cardiac aneurysm repair    . Cholecystectomy    . Tubal ligation    . Varicose vein surgery b/l legs    . Childhood exploratory surgery of heart    . Hernia repair  08-16-10  . Abdominal hysterectomy      sb/l spo, total  . Total knee arthroplasty Right 02/24/2013    Procedure: RIGHT TOTAL KNEE ARTHROPLASTY;  Surgeon: Javier Docker, MD;  Location: [facility];  Service: Orthopedics;  Laterality: Right;  . Left heart catheterization with coronary angiogram N/A 12/05/2011    Procedure: LEFT HEART CATHETERIZATION WITH CORONARY ANGIOGRAM;  Surgeon: Sinclair Grooms, MD;  Location: [facility] CATH LAB;  Service: Cardiovascular;  Laterality: N/A;    Family History  Problem Relation Age of Onset  . Scoliosis Mother   . Arthritis Mother     Rheumatoid  . Aneurysm Mother     heart  . Alcohol abuse Father   . Depression Sister   . Depression Brother   . Alcohol abuse Brother   . Cancer Maternal Grandmother     colon  . Diabetes Maternal Grandfather   . Heart disease Maternal Grandfather   . Alcohol  abuse Maternal Grandfather   . Depression Paternal Grandmother   . Diabetes Paternal Grandmother   . Depression Paternal Grandfather   . Diabetes Paternal Grandfather   . Depression Sister   . Alcohol abuse Brother   . Depression Brother   . Heart disease Brother     Allergies  Allergen Reactions  . Sulfonamide Derivatives     headaches    Current Outpatient Prescriptions on File Prior to Visit  Medication Sig Dispense Refill  . albuterol (PROVENTIL HFA;VENTOLIN HFA) 108 (90 BASE) MCG/ACT inhaler Inhale 2 puffs into the lungs every 6 (six) hours as needed for wheezing or shortness of breath. 1 Inhaler 0  . B Complex-C (B-COMPLEX WITH VITAMIN C) tablet Take 1 tablet by mouth daily.    . Blood Glucose Monitoring Suppl ([place]) W/DEVICE KIT Use to check blood sugar.  DX E11.9 1 each 0  . Cholecalciferol (VITAMIN D) 2000 UNITS tablet Take 2,000 Units by mouth daily.    . clonazePAM (KLONOPIN) 0.5 MG tablet Take 0.5 mg by mouth 2 (two) times daily as needed for anxiety.    . cyclobenzaprine (FLEXERIL) 10 MG tablet Take 1 tablet (10 mg total) by mouth 3 (three) times daily as needed for muscle spasms. 30 tablet 0  . fenofibrate 160 MG tablet Take 1 tablet (160 mg total) by mouth daily. 30 tablet 6  . furosemide (LASIX) 20 MG tablet TAKE 1 TABLET (20 MG TOTAL) BY MOUTH EVERY MORNING. 90 tablet 1  . glucose blood test strip Use once daily to check blood sugar.  DX E11.9 100 each 3  . HYDROcodone-acetaminophen (NORCO) 5-325 MG per tablet Take 1 tablet by mouth every 6 (six) hours as needed for moderate pain. 10 tablet 0  . Krill Oil 300 MG CAPS Take 300 mg by mouth daily.    Marland Kitchen lamoTRIgine (LAMICTAL) 200 MG tablet Take 200 mg by mouth at bedtime.    . metoprolol succinate (TOPROL-XL) 25 MG 24 hr tablet TAKE 1 TABLET (25 MG TOTAL) BY MOUTH EVERY MORNING. 30 tablet 4  . Multiple Vitamin (MULTIVITAMIN WITH MINERALS) TABS tablet Take 1 tablet by mouth daily.    . ONE TOUCH LANCETS  MISC Test once daily to check blood sugar. DX E11.9 100 each 3  . pantoprazole (PROTONIX) 40 MG tablet TAKE 1 TABLET (40 MG TOTAL) BY MOUTH DAILY. 30 tablet 5  . Probiotic Product (ALIGN) 4 MG CAPS Take 1 capsule by mouth daily. 30 capsule 0  . Propylene Glycol (SYSTANE BALANCE) 0.6 % SOLN Place 1 drop into both eyes at bedtime.     Marland Kitchen QUEtiapine (SEROQUEL) 200 MG tablet Take 400 mg by mouth at bedtime. 2- $RemoveBef'200mg'gfHppLhBOX$  tablets at bedtime    . rosuvastatin (CRESTOR) 40 MG tablet Take 1  tablet (40 mg total) by mouth daily. 30 tablet 3  . venlafaxine XR (EFFEXOR-XR) 150 MG 24 hr capsule Take 150 mg by mouth every morning.     . vitamin C (ASCORBIC ACID) 500 MG tablet Take 500 mg by mouth daily.    Marland Kitchen warfarin (COUMADIN) 5 MG tablet TAKE AS DIRECTED BY ANTICOAGULATION CLINIC 35 tablet 3  . [DISCONTINUED] methimazole (TAPAZOLE) 5 MG tablet Take 1 tablet (5 mg total) by mouth 3 (three) times daily. 30 tablet 2   No current facility-administered medications on file prior to visit.    BP 120/70 mmHg  Pulse 67  Temp(Src) 98 F (36.7 C) (Oral)  Ht $R'5\' 8"'Na$  (1.727 m)  Wt 255 lb (115.667 kg)  BMI 38.78 kg/m2  SpO2 97%       Objective:   Physical Exam  General  Mental Status- Alert. Orientation- Orientation x 4.   Skin General:- Normal. Moisture- Dry. Temperature- Warm.  HEENT Head- normal.  Neck Neck- Supple.  Heart Ausculation-RRR  Lungs Ausculation- Clear, even, unlabored bilaterlly.    Abdomen Palpation/Percussion: Palpation and Percussion of the abdomen reveal- faint suprapubic Tendenessr, No Rebound tenderness, No Rigidity(guarding), No Palpable abdominal masses and No jar tenderness. No suprapubic tenderness. Liver:-Normal. Spleen:- Normal. Other Characteristics- No Costovertebral angle tenderness- Left or Costovertebral angle tenderness- Right.  Auscultation: Auscultation of the abdomen reveals- Bowel Sounds normal.  Back- no cva tenderness.      Assessment & Plan:  Your  appear to have a urinary tract infection. I am prescribing  Cephalexin antibiotic for the probable infection. Hydrate well. I am sending out a urine culture. During the interim if your signs and symptoms worsen rather than improving please notify us. We will notify your when the culture results are back.  Pyridium rx  for urinary pain    Follow up in 7 days or as needed.  Cephalexin good antibiotic in light of coumadin use. When culture comes back may need to switch to other antibiotic

## 2015-05-06 LAB — URINE CULTURE

## 2015-05-07 ENCOUNTER — Ambulatory Visit (INDEPENDENT_AMBULATORY_CARE_PROVIDER_SITE_OTHER): Payer: 59

## 2015-05-07 DIAGNOSIS — Z952 Presence of prosthetic heart valve: Secondary | ICD-10-CM

## 2015-05-07 DIAGNOSIS — I4891 Unspecified atrial fibrillation: Secondary | ICD-10-CM

## 2015-05-07 DIAGNOSIS — Z5181 Encounter for therapeutic drug level monitoring: Secondary | ICD-10-CM

## 2015-05-07 DIAGNOSIS — Z954 Presence of other heart-valve replacement: Secondary | ICD-10-CM

## 2015-05-07 LAB — POCT INR: INR: 3

## 2015-05-11 ENCOUNTER — Other Ambulatory Visit: Payer: Self-pay | Admitting: Family Medicine

## 2015-05-14 ENCOUNTER — Other Ambulatory Visit (HOSPITAL_COMMUNITY): Payer: 59

## 2015-05-29 ENCOUNTER — Ambulatory Visit (INDEPENDENT_AMBULATORY_CARE_PROVIDER_SITE_OTHER): Payer: BLUE CROSS/BLUE SHIELD | Admitting: *Deleted

## 2015-05-29 DIAGNOSIS — Z5181 Encounter for therapeutic drug level monitoring: Secondary | ICD-10-CM | POA: Diagnosis not present

## 2015-05-29 DIAGNOSIS — Z952 Presence of prosthetic heart valve: Secondary | ICD-10-CM

## 2015-05-29 DIAGNOSIS — I4891 Unspecified atrial fibrillation: Secondary | ICD-10-CM

## 2015-05-29 DIAGNOSIS — Z954 Presence of other heart-valve replacement: Secondary | ICD-10-CM

## 2015-05-29 LAB — POCT INR: INR: 3.6

## 2015-06-06 ENCOUNTER — Ambulatory Visit (INDEPENDENT_AMBULATORY_CARE_PROVIDER_SITE_OTHER): Payer: BLUE CROSS/BLUE SHIELD | Admitting: Interventional Cardiology

## 2015-06-06 ENCOUNTER — Encounter: Payer: Self-pay | Admitting: Interventional Cardiology

## 2015-06-06 VITALS — BP 126/72 | HR 72 | Ht 68.0 in | Wt 258.4 lb

## 2015-06-06 DIAGNOSIS — Z5181 Encounter for therapeutic drug level monitoring: Secondary | ICD-10-CM | POA: Diagnosis not present

## 2015-06-06 DIAGNOSIS — I480 Paroxysmal atrial fibrillation: Secondary | ICD-10-CM | POA: Diagnosis not present

## 2015-06-06 DIAGNOSIS — I10 Essential (primary) hypertension: Secondary | ICD-10-CM

## 2015-06-06 DIAGNOSIS — Z7901 Long term (current) use of anticoagulants: Secondary | ICD-10-CM | POA: Diagnosis not present

## 2015-06-06 DIAGNOSIS — I359 Nonrheumatic aortic valve disorder, unspecified: Secondary | ICD-10-CM | POA: Diagnosis not present

## 2015-06-06 MED ORDER — FUROSEMIDE 40 MG PO TABS: 40.0000 mg | ORAL_TABLET | Freq: Every day | ORAL | Status: DC

## 2015-06-06 NOTE — Patient Instructions (Addendum)
Medication Instructions:  Your physician has recommended you make the following change in your medication:  INCREASE Lasix to 40mg  daily   Labwork: Your physician recommends that you return for lab work in: 2 weeks ([facility])   Testing/Procedures: Your physician has requested that you have an echocardiogram. Echocardiography is a painless test that uses sound waves to create images of your heart. It provides your doctor with information about the size and shape of your heart and how well your heart's chambers and valves are working. This procedure takes approximately one hour. There are no restrictions for this procedure.  Your physician has recommended that you wear a holter monitor. Holter monitors are medical devices that record the heart's electrical activity. Doctors most often use these monitors to diagnose arrhythmias. Arrhythmias are problems with the speed or rhythm of the heartbeat. The monitor is a small, portable device. You can wear one while you do your normal daily activities. This is usually used to diagnose what is causing palpitations/syncope (passing out).    Follow-Up: Your physician recommends that you schedule a follow-up appointment in: 2 months with an APP  Your physician wants you to follow-up in: 1 year with Dr.Smith You will receive a reminder letter in the mail two months in advance. If you don't receive a letter, please call our office to schedule the follow-up appointment.    Any Other Special Instructions Will Be Listed Below (If Applicable).     If you need a refill on your cardiac medications before your next appointment, please call your pharmacy.

## 2015-06-06 NOTE — Progress Notes (Signed)
Cardiology Office Note   Date:  06/06/2015   ID:  [name], DOB 1950-11-13, MRN [id number]  PCP:  Penni Homans, MD  Cardiologist:  Sinclair Grooms, MD   Chief Complaint  Patient presents with  . Cardiac Valve Problem      History of Present Illness: [name] is a 65 y.o. female who presents for aortic valve replacement for bicuspid valve, diastolic left ventricular heart failure, significant history of depression/anxiety/bipolar disorder, lower extremity edema and hand swelling  Patient has had precordial and interscapular aching discomfort. It occurs with sitting. Not associated with dyspnea diaphoresis or radiation. Usually after 30 minutes a so it resolves spontaneously. No exertional complaints. Significant weight gain. Mild orthopnea. No PND.  Past Medical History  Diagnosis Date  . Anemia   . Anxiety   . Depression   . Diverticulosis   . Elevated cholesterol   . Obesity   . THYROMEGALY 07/22/2010  . Overweight(278.02) 03/11/2010  . Mixed hyperlipidemia 03/11/2010  . MEASLES, HX OF 03/11/2010  . KNEE PAIN, RIGHT 05/28/2010  . HYPERTRIGLYCERIDEMIA 03/11/2010  . HEART MURMUR, HX OF 03/11/2010  . FIBROIDS, UTERUS 03/11/2010  . Diarrhea 08/05/2010  . CONSTIPATION 03/11/2010  . CHICKENPOX, HX OF 03/11/2010  . BIPOLAR AFFECTIVE DISORDER 03/11/2010  . AORTIC VALVE REPLACEMENT, HX OF 03/11/2010  . ANEMIA 05/28/2010  . Acute bronchitis 07/22/2010  . Abdominal pain, generalized 03/19/2010  . Peripheral edema 11/18/2010  . Bacterial vaginitis 11/18/2010  . Hematuria 12/18/2010  . Diverticulitis 12/18/2010  . Hyperthyroidism 12/18/2010  . Fatigue 12/30/2010  . Renal insufficiency 12/30/2010  . Grave's disease 8-12  . Arm lesion 03/23/2011  . UTI (lower urinary tract infection) 08/14/2011  . Breast pain in female 11/17/2011  . Chest pain 11/17/2011  . Hematoma 12/18/2011  . Cervical cancer screening 03/22/2012  . Foot pain, left 03/22/2012  . Foot fracture, left  03/22/2012  . Tick bite of back 10/15/2012  . Preop examination 02/03/2013  . Dysrhythmia   . Hypertension   . Shortness of breath     with exertion   . Heart murmur   . Urinary tract bacterial infections   . Arthritis     Past Surgical History  Procedure Laterality Date  . Appendectomy      Required revision for EColi infection, required recurrent packing  . Bowel obstruction      Requiring adhesions to be removed  . Aortic valve replacement    . Open heart with a cardiac aneurysm repair    . Cholecystectomy    . Tubal ligation    . Varicose vein surgery b/l legs    . Childhood exploratory surgery of heart    . Hernia repair  08-16-10  . Abdominal hysterectomy      sb/l spo, total  . Total knee arthroplasty Right 02/24/2013    Procedure: RIGHT TOTAL KNEE ARTHROPLASTY;  Surgeon: Johnn Hai, MD;  Location: [facility];  Service: Orthopedics;  Laterality: Right;  . Left heart catheterization with coronary angiogram N/A 12/05/2011    Procedure: LEFT HEART CATHETERIZATION WITH CORONARY ANGIOGRAM;  Surgeon: Sinclair Grooms, MD;  Location: [facility] CATH LAB;  Service: Cardiovascular;  Laterality: N/A;     Current Outpatient Prescriptions  Medication Sig Dispense Refill  . albuterol (PROVENTIL HFA;VENTOLIN HFA) 108 (90 BASE) MCG/ACT inhaler Inhale 2 puffs into the lungs every 6 (six) hours as needed for wheezing or shortness of breath. 1 Inhaler 0  . B Complex-C (B-COMPLEX WITH  VITAMIN C) tablet Take 1 tablet by mouth daily.    . Blood Glucose Monitoring Suppl ([place]) W/DEVICE KIT Use to check blood sugar.  DX E11.9 1 each 0  . Cholecalciferol (VITAMIN D) 2000 UNITS tablet Take 2,000 Units by mouth daily.    . clonazePAM (KLONOPIN) 0.5 MG tablet Take 0.5 mg by mouth 2 (two) times daily as needed for anxiety.    . cyclobenzaprine (FLEXERIL) 10 MG tablet Take 1 tablet (10 mg total) by mouth 3 (three) times daily as needed for muscle spasms. 30 tablet 0  . fenofibrate 160 MG  tablet Take 1 tablet (160 mg total) by mouth daily. 30 tablet 6  . furosemide (LASIX) 20 MG tablet TAKE 1 TABLET (20 MG TOTAL) BY MOUTH EVERY MORNING. 30 tablet 6  . glucose blood test strip Use once daily to check blood sugar.  DX E11.9 100 each 3  . HYDROcodone-acetaminophen (NORCO) 5-325 MG per tablet Take 1 tablet by mouth every 6 (six) hours as needed for moderate pain. 10 tablet 0  . Krill Oil 300 MG CAPS Take 300 mg by mouth daily.    Marland Kitchen lamoTRIgine (LAMICTAL) 200 MG tablet Take 200 mg by mouth at bedtime.    . metoprolol succinate (TOPROL-XL) 25 MG 24 hr tablet TAKE 1 TABLET (25 MG TOTAL) BY MOUTH EVERY MORNING. 30 tablet 4  . Multiple Vitamin (MULTIVITAMIN WITH MINERALS) TABS tablet Take 1 tablet by mouth daily.    . ONE TOUCH LANCETS MISC Test once daily to check blood sugar. DX E11.9 100 each 3  . pantoprazole (PROTONIX) 40 MG tablet TAKE 1 TABLET (40 MG TOTAL) BY MOUTH DAILY. 30 tablet 5  . Probiotic Product (ALIGN) 4 MG CAPS Take 1 capsule by mouth daily. 30 capsule 0  . Propylene Glycol (SYSTANE BALANCE) 0.6 % SOLN Place 1 drop into both eyes at bedtime.     . rosuvastatin (CRESTOR) 40 MG tablet Take 1 tablet (40 mg total) by mouth daily. 30 tablet 3  . SEROQUEL XR 400 MG 24 hr tablet Take 400 mg by mouth daily.   1  . venlafaxine XR (EFFEXOR-XR) 150 MG 24 hr capsule Take 150 mg by mouth every morning.     . vitamin C (ASCORBIC ACID) 500 MG tablet Take 500 mg by mouth daily.    Marland Kitchen warfarin (COUMADIN) 5 MG tablet TAKE AS DIRECTED BY ANTICOAGULATION CLINIC 35 tablet 3  . [DISCONTINUED] methimazole (TAPAZOLE) 5 MG tablet Take 1 tablet (5 mg total) by mouth 3 (three) times daily. 30 tablet 2   No current facility-administered medications for this visit.    Allergies:   Sulfonamide derivatives    Social History:  The patient  reports that she quit smoking about 25 years ago. She has never used smokeless tobacco. She reports that she drinks alcohol. She reports that she does not use  illicit drugs.   Family History:  The patient's family history includes Alcohol abuse in her brother, brother, father, and maternal grandfather; Aneurysm in her mother; Arthritis in her mother; Cancer in her maternal grandmother; Depression in her brother, brother, paternal grandfather, paternal grandmother, sister, and sister; Diabetes in her maternal grandfather, paternal grandfather, and paternal grandmother; Heart disease in her brother and maternal grandfather; Scoliosis in her mother.    ROS:  Please see the history of present illness.   Otherwise, review of systems are positive for palpitations have been noted, occasional wheezing, anxiety, joint swelling and aching, balanced this Durbin's. She  also has back discomfort..   All other systems are reviewed and negative.    PHYSICAL EXAM: VS:  BP 126/72 mmHg  Pulse 72  Ht _0  (1.727 m)  Wt 258 lb 6.4 oz (117.209 kg)  BMI 39.30 kg/m2 , BMI Body mass index is 39.3 kg/(m^2). GEN: Well nourished, well developed, in no acute distress HEENT: normal Neck: no JVD, carotid bruits, or masses Cardiac: RRR.  There 1/6 systolic murmur without diastolic murmur. Crisp valve closure sounds. No rub is heard.There is bilateral 1+ edema edema. Respiratory:  clear to auscultation bilaterally, normal work of breathing. GI: soft, nontender, nondistended, + BS MS: no deformity or atrophy Skin: warm and dry, no rash Neuro:  Strength and sensation are intact Psych: euthymic mood, full affect   EKG:  EKG is not ordered today. The ekg reveals sinus rhythm with interventricular conduction delay/left bundle   Recent Labs: 04/03/2015: ALT 32; BUN 18; Creatinine, Ser 0.88; Hemoglobin 13.9; Platelets 224.0; Potassium 3.8; Sodium 138; TSH 3.38    Lipid Panel    Component Value Date/Time   CHOL 245* 04/03/2015 1014   TRIG * 04/03/2015 1014    667.0 Triglyceride is over 400; calculations on Lipids are invalid.   HDL 46.00 04/03/2015 1014   CHOLHDL 5  04/03/2015 1014   VLDL 50.6* 02/24/2014 1037   LDLCALC 71 04/04/2013 1622   LDLDIRECT 130.0 04/03/2015 1014      Wt Readings from Last 3 Encounters:  06/06/15 258 lb 6.4 oz (117.209 kg)  05/03/15 255 lb (115.667 kg)  04/24/15 257 lb 8 oz (116.801 kg)      Other studies Reviewed: Additional studies/ records that were reviewed today include: No recent assessment of valve. The findings include last echocardiogram performed was greater than 3 years ago. Mechanical valve needs to be reassessed.    ASSESSMENT AND PLAN:  1. Aortic valve disorder There is evidence of volume overload. Need to increase diuretic therapy because of ill diastolic heart failure is initiated.  2. Encounter for therapeutic drug monitoring No bleeding on Coumadin  3. Diastolic left ventricular dysfunction/heart failure Volume overload is present  4. Essential hypertension Blood pressure is adequate  5. Paroxysmal atrial fibrillation (HCC) History of PAF not recently assess. Last EKG performed in November reveals sinus rhythm with PACs    Current medicines are reviewed at length with the patient today.  The patient has the following concerns regarding medicines: .  The following changes/actions have been instituted:    2-D Doppler echocardiogram  Increase furosemide to 40 mg daily  24-hour Holter  Basic metabolic panel in one week after increase in furosemide dose  Labs/ tests ordered today include:  No orders of the defined types were placed in this encounter.     Disposition:   FU with HS in 2 month  Signed, Sinclair Grooms, MD  06/06/2015 9:54 AM    [address], [place], [place]  [address] Phone: [phone]; Fax: [phone]

## 2015-06-12 ENCOUNTER — Ambulatory Visit (INDEPENDENT_AMBULATORY_CARE_PROVIDER_SITE_OTHER): Payer: BLUE CROSS/BLUE SHIELD

## 2015-06-12 DIAGNOSIS — I480 Paroxysmal atrial fibrillation: Secondary | ICD-10-CM | POA: Diagnosis not present

## 2015-06-20 ENCOUNTER — Other Ambulatory Visit: Payer: Self-pay

## 2015-06-20 ENCOUNTER — Other Ambulatory Visit: Payer: Self-pay | Admitting: *Deleted

## 2015-06-20 ENCOUNTER — Ambulatory Visit (INDEPENDENT_AMBULATORY_CARE_PROVIDER_SITE_OTHER): Payer: BLUE CROSS/BLUE SHIELD | Admitting: Pharmacist

## 2015-06-20 ENCOUNTER — Other Ambulatory Visit (INDEPENDENT_AMBULATORY_CARE_PROVIDER_SITE_OTHER): Payer: BLUE CROSS/BLUE SHIELD | Admitting: *Deleted

## 2015-06-20 ENCOUNTER — Ambulatory Visit (HOSPITAL_COMMUNITY): Payer: BLUE CROSS/BLUE SHIELD | Attending: Internal Medicine

## 2015-06-20 DIAGNOSIS — Z952 Presence of prosthetic heart valve: Secondary | ICD-10-CM

## 2015-06-20 DIAGNOSIS — I071 Rheumatic tricuspid insufficiency: Secondary | ICD-10-CM | POA: Insufficient documentation

## 2015-06-20 DIAGNOSIS — I340 Nonrheumatic mitral (valve) insufficiency: Secondary | ICD-10-CM | POA: Insufficient documentation

## 2015-06-20 DIAGNOSIS — I10 Essential (primary) hypertension: Secondary | ICD-10-CM

## 2015-06-20 DIAGNOSIS — I480 Paroxysmal atrial fibrillation: Secondary | ICD-10-CM | POA: Diagnosis not present

## 2015-06-20 DIAGNOSIS — Z954 Presence of other heart-valve replacement: Secondary | ICD-10-CM | POA: Diagnosis not present

## 2015-06-20 DIAGNOSIS — I517 Cardiomegaly: Secondary | ICD-10-CM | POA: Diagnosis not present

## 2015-06-20 DIAGNOSIS — I4891 Unspecified atrial fibrillation: Secondary | ICD-10-CM | POA: Diagnosis not present

## 2015-06-20 DIAGNOSIS — R079 Chest pain, unspecified: Secondary | ICD-10-CM | POA: Insufficient documentation

## 2015-06-20 DIAGNOSIS — Z6839 Body mass index (BMI) 39.0-39.9, adult: Secondary | ICD-10-CM | POA: Diagnosis not present

## 2015-06-20 DIAGNOSIS — Z5181 Encounter for therapeutic drug level monitoring: Secondary | ICD-10-CM | POA: Diagnosis not present

## 2015-06-20 DIAGNOSIS — I359 Nonrheumatic aortic valve disorder, unspecified: Secondary | ICD-10-CM

## 2015-06-20 DIAGNOSIS — E669 Obesity, unspecified: Secondary | ICD-10-CM | POA: Diagnosis not present

## 2015-06-20 DIAGNOSIS — E782 Mixed hyperlipidemia: Secondary | ICD-10-CM

## 2015-06-20 LAB — BASIC METABOLIC PANEL
BUN: 20 mg/dL (ref 7–25)
CO2: 25 mmol/L (ref 20–31)
Calcium: 9 mg/dL (ref 8.6–10.4)
Chloride: 103 mmol/L (ref 98–110)
Creat: 0.89 mg/dL (ref 0.50–0.99)
Glucose, Bld: 193 mg/dL — ABNORMAL HIGH (ref 65–99)
Potassium: 4 mmol/L (ref 3.5–5.3)
Sodium: 138 mmol/L (ref 135–146)

## 2015-06-20 LAB — POCT INR: INR: 3.1

## 2015-06-20 NOTE — Addendum Note (Signed)
Addended by: Eulis Foster on: 06/20/2015 10:49 AM   Modules accepted: Orders

## 2015-07-03 ENCOUNTER — Telehealth: Payer: Self-pay | Admitting: *Deleted

## 2015-07-03 NOTE — Telephone Encounter (Signed)
Called pt to confirm appt w/ Ashlee. Pt requests to reschedule but has no availability at this time. Appt canceled. Pt will call back to reschedule once she is available.

## 2015-07-04 ENCOUNTER — Ambulatory Visit: Payer: BLUE CROSS/BLUE SHIELD

## 2015-07-14 ENCOUNTER — Other Ambulatory Visit: Payer: Self-pay | Admitting: Interventional Cardiology

## 2015-07-14 ENCOUNTER — Other Ambulatory Visit: Payer: Self-pay | Admitting: Family Medicine

## 2015-07-18 ENCOUNTER — Encounter: Payer: Self-pay | Admitting: Physician Assistant

## 2015-07-18 ENCOUNTER — Ambulatory Visit (INDEPENDENT_AMBULATORY_CARE_PROVIDER_SITE_OTHER): Payer: BLUE CROSS/BLUE SHIELD | Admitting: *Deleted

## 2015-07-18 ENCOUNTER — Ambulatory Visit (INDEPENDENT_AMBULATORY_CARE_PROVIDER_SITE_OTHER): Payer: BLUE CROSS/BLUE SHIELD | Admitting: Physician Assistant

## 2015-07-18 VITALS — BP 110/70 | HR 71 | Temp 97.8°F | Ht 68.0 in | Wt 257.2 lb

## 2015-07-18 DIAGNOSIS — Z5181 Encounter for therapeutic drug level monitoring: Secondary | ICD-10-CM | POA: Diagnosis not present

## 2015-07-18 DIAGNOSIS — I4891 Unspecified atrial fibrillation: Secondary | ICD-10-CM

## 2015-07-18 DIAGNOSIS — R399 Unspecified symptoms and signs involving the genitourinary system: Secondary | ICD-10-CM

## 2015-07-18 DIAGNOSIS — Z954 Presence of other heart-valve replacement: Secondary | ICD-10-CM

## 2015-07-18 DIAGNOSIS — Z952 Presence of prosthetic heart valve: Secondary | ICD-10-CM

## 2015-07-18 LAB — POC URINALSYSI DIPSTICK (AUTOMATED)
Bilirubin, UA: NEGATIVE
Blood, UA: NEGATIVE
Glucose, UA: NEGATIVE
Ketones, UA: NEGATIVE
Leukocytes, UA: NEGATIVE
Nitrite, UA: NEGATIVE
Protein, UA: NEGATIVE
Spec Grav, UA: 1.02
Urobilinogen, UA: 0.2
pH, UA: 6

## 2015-07-18 LAB — POCT INR: INR: 4.7

## 2015-07-18 MED ORDER — CEPHALEXIN 500 MG PO CAPS: 500.0000 mg | ORAL_CAPSULE | Freq: Four times a day (QID) | ORAL | Status: DC

## 2015-07-18 NOTE — Progress Notes (Signed)
Patient presents to clinic today with concerns of a UTI. Endorses a funny feeling with urination over the past 2 days with low back pain and suprapubic pressure. Denies hematuria but notes urgency. Denies nausea, vomiting, flank pain, fever or chills. States this feels exactly like it does when she has had prior UTI.  Past Medical History  Diagnosis Date  . Anemia   . Anxiety   . Depression   . Diverticulosis   . Elevated cholesterol   . Obesity   . THYROMEGALY 07/22/2010  . Overweight(278.02) 03/11/2010  . Mixed hyperlipidemia 03/11/2010  . MEASLES, HX OF 03/11/2010  . KNEE PAIN, RIGHT 05/28/2010  . HYPERTRIGLYCERIDEMIA 03/11/2010  . HEART MURMUR, HX OF 03/11/2010  . FIBROIDS, UTERUS 03/11/2010  . Diarrhea 08/05/2010  . CONSTIPATION 03/11/2010  . CHICKENPOX, HX OF 03/11/2010  . BIPOLAR AFFECTIVE DISORDER 03/11/2010  . AORTIC VALVE REPLACEMENT, HX OF 03/11/2010  . ANEMIA 05/28/2010  . Acute bronchitis 07/22/2010  . Abdominal pain, generalized 03/19/2010  . Peripheral edema 11/18/2010  . Bacterial vaginitis 11/18/2010  . Hematuria 12/18/2010  . Diverticulitis 12/18/2010  . Hyperthyroidism 12/18/2010  . Fatigue 12/30/2010  . Renal insufficiency 12/30/2010  . Grave's disease 8-12  . Arm lesion 03/23/2011  . UTI (lower urinary tract infection) 08/14/2011  . Breast pain in female 11/17/2011  . Chest pain 11/17/2011  . Hematoma 12/18/2011  . Cervical cancer screening 03/22/2012  . Foot pain, left 03/22/2012  . Foot fracture, left 03/22/2012  . Tick bite of back 10/15/2012  . Preop examination 02/03/2013  . Dysrhythmia   . Hypertension   . Shortness of breath     with exertion   . Heart murmur   . Urinary tract bacterial infections   . Arthritis     Current Outpatient Prescriptions on File Prior to Visit  Medication Sig Dispense Refill  . albuterol (PROVENTIL HFA;VENTOLIN HFA) 108 (90 BASE) MCG/ACT inhaler Inhale 2 puffs into the lungs every 6 (six) hours as needed for wheezing or  shortness of breath. 1 Inhaler 0  . B Complex-C (B-COMPLEX WITH VITAMIN C) tablet Take 1 tablet by mouth daily.    . Blood Glucose Monitoring Suppl ([place]) W/DEVICE KIT Use to check blood sugar.  DX E11.9 1 each 0  . Cholecalciferol (VITAMIN D) 2000 UNITS tablet Take 2,000 Units by mouth daily.    . clonazePAM (KLONOPIN) 0.5 MG tablet Take 0.5 mg by mouth 2 (two) times daily as needed for anxiety.    . cyclobenzaprine (FLEXERIL) 10 MG tablet Take 1 tablet (10 mg total) by mouth 3 (three) times daily as needed for muscle spasms. 30 tablet 0  . fenofibrate 160 MG tablet Take 1 tablet (160 mg total) by mouth daily. 30 tablet 6  . furosemide (LASIX) 40 MG tablet Take 1 tablet (40 mg total) by mouth daily. 30 tablet 11  . glucose blood test strip Use once daily to check blood sugar.  DX E11.9 100 each 3  . HYDROcodone-acetaminophen (NORCO) 5-325 MG per tablet Take 1 tablet by mouth every 6 (six) hours as needed for moderate pain. 10 tablet 0  . Krill Oil 300 MG CAPS Take 300 mg by mouth daily.    Marland Kitchen lamoTRIgine (LAMICTAL) 200 MG tablet Take 200 mg by mouth at bedtime.    . metoprolol succinate (TOPROL-XL) 25 MG 24 hr tablet TAKE 1 TABLET (25 MG TOTAL) BY MOUTH EVERY MORNING. 30 tablet 4  . Multiple Vitamin (MULTIVITAMIN WITH MINERALS) TABS tablet  Take 1 tablet by mouth daily.    . ONE TOUCH LANCETS MISC Test once daily to check blood sugar. DX E11.9 100 each 3  . pantoprazole (PROTONIX) 40 MG tablet TAKE 1 TABLET (40 MG TOTAL) BY MOUTH DAILY. 30 tablet 5  . Probiotic Product (ALIGN) 4 MG CAPS Take 1 capsule by mouth daily. 30 capsule 0  . Propylene Glycol (SYSTANE BALANCE) 0.6 % SOLN Place 1 drop into both eyes at bedtime.     . rosuvastatin (CRESTOR) 40 MG tablet TAKE 1 TABLET (40 MG TOTAL) BY MOUTH DAILY. 30 tablet 2  . SEROQUEL XR 400 MG 24 hr tablet Take 400 mg by mouth daily.   1  . venlafaxine XR (EFFEXOR-XR) 150 MG 24 hr capsule Take 150 mg by mouth every morning.     . vitamin  C (ASCORBIC ACID) 500 MG tablet Take 500 mg by mouth daily.    Marland Kitchen warfarin (COUMADIN) 5 MG tablet TAKE AS DIRECTED BY ANTICOAGULATION CLINIC 35 tablet 2  . [DISCONTINUED] methimazole (TAPAZOLE) 5 MG tablet Take 1 tablet (5 mg total) by mouth 3 (three) times daily. 30 tablet 2   No current facility-administered medications on file prior to visit.    Allergies  Allergen Reactions  . Sulfonamide Derivatives     headaches    Family History  Problem Relation Age of Onset  . Scoliosis Mother   . Arthritis Mother     Rheumatoid  . Aneurysm Mother     heart  . Alcohol abuse Father   . Depression Sister   . Depression Brother   . Alcohol abuse Brother   . Cancer Maternal Grandmother     colon  . Diabetes Maternal Grandfather   . Heart disease Maternal Grandfather   . Alcohol abuse Maternal Grandfather   . Depression Paternal Grandmother   . Diabetes Paternal Grandmother   . Depression Paternal Grandfather   . Diabetes Paternal Grandfather   . Depression Sister   . Alcohol abuse Brother   . Depression Brother   . Heart disease Brother     Social History   Social History  . Marital Status: Married    Spouse Name: N/A  . Number of Children: N/A  . Years of Education: N/A   Social History Main Topics  . Smoking status: Former Smoker    Quit date: 05/26/1990  . Smokeless tobacco: Never Used  . Alcohol Use: 0.0 oz/week    0 drink(s) per week     Comment: rare  . Drug Use: No  . Sexual Activity: No   Other Topics Concern  . None   Social History Narrative   Review of Systems - See HPI.  All other ROS are negative.  BP 110/70 mmHg  Pulse 71  Temp(Src) 97.8 F (36.6 C) (Oral)  Ht 5' 8" (1.727 m)  Wt 257 lb 3.2 oz (116.665 kg)  BMI 39.12 kg/m2  SpO2 96%  Physical Exam  Constitutional: She is oriented to person, place, and time and well-developed, well-nourished, and in no distress.  HENT:  Head: Normocephalic and atraumatic.  Eyes: Conjunctivae are normal.    Cardiovascular: Normal rate, regular rhythm, normal heart sounds and intact distal pulses.   Pulmonary/Chest: Effort normal and breath sounds normal. No respiratory distress. She has no wheezes. She has no rales. She exhibits no tenderness.  Abdominal: Soft. Bowel sounds are normal. She exhibits no distension. There is no tenderness.  Neurological: She is alert and oriented to person, place, and time.  Skin: Skin is warm and dry. No rash noted.  Psychiatric: Affect normal.  Vitals reviewed.   Recent Results (from the past 2160 hour(s))  POCT INR     Status: None   Collection Time: 04/23/15 12:51 PM  Result Value Ref Range   INR 4.5   Hemoglobin A1c     Status: None   Collection Time: 04/24/15 10:07 AM  Result Value Ref Range   Hgb A1c MFr Bld 6.5 4.6 - 6.5 %    Comment: Glycemic Control Guidelines for People with Diabetes:Non Diabetic:  <6%Goal of Therapy: <7%Additional Action Suggested:  >8%   POCT urinalysis dipstick     Status: Abnormal   Collection Time: 05/03/15  5:10 PM  Result Value Ref Range   Color, UA Yellow    Clarity, UA Cloudy    Glucose, UA neg    Bilirubin, UA neg    Ketones, UA neg    Spec Grav, UA 1.020    Blood, UA 1+    pH, UA 6.0    Protein, UA neg    Urobilinogen, UA 0.2    Nitrite, UA neg    Leukocytes, UA moderate (2+) (A) Negative  Urine Culture     Status: None   Collection Time: 05/03/15  5:10 PM  Result Value Ref Range   Culture ESCHERICHIA COLI    Colony Count 20,OOO COLONIES/ML    Organism ID, Bacteria ESCHERICHIA COLI       Susceptibility   Escherichia coli -  (no method available)    AMPICILLIN >=32 Resistant     AMOX/CLAVULANIC 4 Sensitive     AMPICILLIN/SULBACTAM 16 Intermediate     PIP/TAZO <=4 Sensitive     IMIPENEM <=0.25 Sensitive     CEFAZOLIN <=4 Not Reportable     CEFTRIAXONE <=1 Sensitive     CEFTAZIDIME <=1 Sensitive     CEFEPIME <=1 Sensitive     GENTAMICIN >=16 Resistant     TOBRAMYCIN 8 Intermediate     CIPROFLOXACIN  <=0.25 Sensitive     LEVOFLOXACIN 0.5 Sensitive     NITROFURANTOIN <=16 Sensitive     TRIMETH/SULFA* >=320 Resistant      * NR=NOT REPORTABLE,SEE COMMENTORAL therapy:A cefazolin MIC of <32 predicts susceptibility to the oral agents cefaclor,cefdinir,cefpodoxime,cefprozil,cefuroxime,cephalexin,and loracarbef when used for therapy of uncomplicated UTIs due to E.coli,K.pneumomiae,and P.mirabilis. PARENTERAL therapy: A cefazolinMIC of >8 indicates resistance to parenteralcefazolin. An alternate test method must beperformed to confirm susceptibility to parenteralcefazolin.  POCT INR     Status: None   Collection Time: 05/07/15 11:01 AM  Result Value Ref Range   INR 3.0   POCT INR     Status: None   Collection Time: 05/29/15 11:04 AM  Result Value Ref Range   INR 3.6   Basic Metabolic Panel (BMET)     Status: Abnormal   Collection Time: 06/20/15 10:49 AM  Result Value Ref Range   Sodium 138 135 - 146 mmol/L   Potassium 4.0 3.5 - 5.3 mmol/L   Chloride 103 98 - 110 mmol/L   CO2 25 20 - 31 mmol/L   Glucose, Bld 193 (H) 65 - 99 mg/dL   BUN 20 7 - 25 mg/dL   Creat 0.89 0.50 - 0.99 mg/dL   Calcium 9.0 8.6 - 10.4 mg/dL  POCT INR     Status: None   Collection Time: 06/20/15 12:47 PM  Result Value Ref Range   INR 3.1   POCT Urinalysis Dipstick (Automated)     Status: None  Collection Time: 07/18/15 10:31 AM  Result Value Ref Range   Color, UA yellow    Clarity, UA clear    Glucose, UA neg    Bilirubin, UA neg    Ketones, UA neg    Spec Grav, UA 1.020    Blood, UA neg    pH, UA 6.0    Protein, UA neg    Urobilinogen, UA 0.2    Nitrite, UA neg    Leukocytes, UA Negative Negative    Assessment/Plan: UTI symptoms Urine dip unremarkable. Sent urine for culture. Patient with significant history of UTI and endorses symptoms consistent with prior infection. Will rx Keflex while waiting on culture results. Supportive measures reviewed. Will alter regimen based on results.

## 2015-07-18 NOTE — Progress Notes (Signed)
Pre visit review using our clinic review tool, if applicable. No additional management support is needed unless otherwise documented below in the visit note. 

## 2015-07-18 NOTE — Assessment & Plan Note (Signed)
Urine dip unremarkable. Sent urine for culture. Patient with significant history of UTI and endorses symptoms consistent with prior infection. Will rx Keflex while waiting on culture results. Supportive measures reviewed. Will alter regimen based on results.

## 2015-07-18 NOTE — Patient Instructions (Signed)
Your symptoms are consistent with a bladder infection, also called acute cystitis. Please take your antibiotic (Keflex) as directed until all pills are gone.  Stay very well hydrated.  Consider a daily probiotic (Align, Culturelle, or Activia) to help prevent stomach upset caused by the antibiotic.  Taking a probiotic daily may also help prevent recurrent UTIs.  Also consider taking AZO (Phenazopyridine) tablets to help decrease pain with urination.  I will call you with your urine testing results.  We will change antibiotics if indicated.  Call or return to clinic if symptoms are not resolved by completion of antibiotic.   Urinary Tract Infection A urinary tract infection (UTI) can occur any place along the urinary tract. The tract includes the kidneys, ureters, bladder, and urethra. A type of germ called bacteria often causes a UTI. UTIs are often helped with antibiotic medicine.  HOME CARE   If given, take antibiotics as told by your doctor. Finish them even if you start to feel better.  Drink enough fluids to keep your pee (urine) clear or pale yellow.  Avoid tea, drinks with caffeine, and bubbly (carbonated) drinks.  Pee often. Avoid holding your pee in for a long time.  Pee before and after having sex (intercourse).  Wipe from front to back after you poop (bowel movement) if you are a woman. Use each tissue only once. GET HELP RIGHT AWAY IF:   You have back pain.  You have lower belly (abdominal) pain.  You have chills.  You feel sick to your stomach (nauseous).  You throw up (vomit).  Your burning or discomfort with peeing does not go away.  You have a fever.  Your symptoms are not better in 3 days. MAKE SURE YOU:   Understand these instructions.  Will watch your condition.  Will get help right away if you are not doing well or get worse. Document Released: 10/29/2007 Document Revised: 02/04/2012 Document Reviewed: 12/11/2011 Exit[employer] Patient Information 2015  [employer]. This information is not intended to replace advice given to you by your health care provider. Make sure you discuss any questions you have with your health care provider.   

## 2015-07-26 ENCOUNTER — Ambulatory Visit (INDEPENDENT_AMBULATORY_CARE_PROVIDER_SITE_OTHER): Payer: BLUE CROSS/BLUE SHIELD | Admitting: *Deleted

## 2015-07-26 DIAGNOSIS — Z5181 Encounter for therapeutic drug level monitoring: Secondary | ICD-10-CM

## 2015-07-26 DIAGNOSIS — Z952 Presence of prosthetic heart valve: Secondary | ICD-10-CM

## 2015-07-26 DIAGNOSIS — Z954 Presence of other heart-valve replacement: Secondary | ICD-10-CM

## 2015-07-26 DIAGNOSIS — I4891 Unspecified atrial fibrillation: Secondary | ICD-10-CM

## 2015-07-26 LAB — POCT INR: INR: 2.9

## 2015-07-31 ENCOUNTER — Other Ambulatory Visit: Payer: 59

## 2015-08-06 ENCOUNTER — Ambulatory Visit: Payer: 59 | Admitting: Family Medicine

## 2015-08-23 ENCOUNTER — Ambulatory Visit: Payer: 59 | Admitting: Family Medicine

## 2015-08-30 ENCOUNTER — Telehealth: Payer: Self-pay | Admitting: *Deleted

## 2015-08-30 NOTE — Telephone Encounter (Signed)
Pt went to [facility] in [place] and had her INR done on March 22nd  and they did not fax results to our clinic. Spoke with pt several times and she even went back to lab and they did not help with her questions. Also tried several times to call [facility] lab and even following promps the phone would disconnect Spoke with pt today and she is coming home this weekend so an appt made for her to be seen on Monday April 10th and she states understanding

## 2015-09-03 ENCOUNTER — Ambulatory Visit (INDEPENDENT_AMBULATORY_CARE_PROVIDER_SITE_OTHER): Payer: BLUE CROSS/BLUE SHIELD | Admitting: Pharmacist

## 2015-09-03 DIAGNOSIS — Z954 Presence of other heart-valve replacement: Secondary | ICD-10-CM

## 2015-09-03 DIAGNOSIS — Z952 Presence of prosthetic heart valve: Secondary | ICD-10-CM

## 2015-09-03 DIAGNOSIS — I4891 Unspecified atrial fibrillation: Secondary | ICD-10-CM | POA: Diagnosis not present

## 2015-09-03 DIAGNOSIS — Z5181 Encounter for therapeutic drug level monitoring: Secondary | ICD-10-CM

## 2015-09-03 LAB — POCT INR: INR: 4.2

## 2015-09-10 ENCOUNTER — Telehealth: Payer: Self-pay | Admitting: Behavioral Health

## 2015-09-10 NOTE — Telephone Encounter (Signed)
Patient cancelled appointment and will reschedule for a later date.

## 2015-09-11 ENCOUNTER — Telehealth: Payer: Self-pay | Admitting: Internal Medicine

## 2015-09-11 ENCOUNTER — Encounter: Payer: BLUE CROSS/BLUE SHIELD | Admitting: Family Medicine

## 2015-09-11 ENCOUNTER — Encounter (HOSPITAL_COMMUNITY): Payer: Self-pay | Admitting: *Deleted

## 2015-09-11 DIAGNOSIS — F319 Bipolar disorder, unspecified: Secondary | ICD-10-CM | POA: Diagnosis not present

## 2015-09-11 DIAGNOSIS — Z862 Personal history of diseases of the blood and blood-forming organs and certain disorders involving the immune mechanism: Secondary | ICD-10-CM | POA: Diagnosis not present

## 2015-09-11 DIAGNOSIS — Z90710 Acquired absence of both cervix and uterus: Secondary | ICD-10-CM | POA: Diagnosis not present

## 2015-09-11 DIAGNOSIS — Z8719 Personal history of other diseases of the digestive system: Secondary | ICD-10-CM | POA: Insufficient documentation

## 2015-09-11 DIAGNOSIS — Z87891 Personal history of nicotine dependence: Secondary | ICD-10-CM | POA: Insufficient documentation

## 2015-09-11 DIAGNOSIS — E669 Obesity, unspecified: Secondary | ICD-10-CM | POA: Diagnosis not present

## 2015-09-11 DIAGNOSIS — F419 Anxiety disorder, unspecified: Secondary | ICD-10-CM | POA: Diagnosis not present

## 2015-09-11 DIAGNOSIS — Z9889 Other specified postprocedural states: Secondary | ICD-10-CM | POA: Diagnosis not present

## 2015-09-11 DIAGNOSIS — Z87828 Personal history of other (healed) physical injury and trauma: Secondary | ICD-10-CM | POA: Insufficient documentation

## 2015-09-11 DIAGNOSIS — I10 Essential (primary) hypertension: Secondary | ICD-10-CM | POA: Diagnosis not present

## 2015-09-11 DIAGNOSIS — Z8709 Personal history of other diseases of the respiratory system: Secondary | ICD-10-CM | POA: Diagnosis not present

## 2015-09-11 DIAGNOSIS — Z7901 Long term (current) use of anticoagulants: Secondary | ICD-10-CM | POA: Diagnosis not present

## 2015-09-11 DIAGNOSIS — Z8781 Personal history of (healed) traumatic fracture: Secondary | ICD-10-CM | POA: Diagnosis not present

## 2015-09-11 DIAGNOSIS — E782 Mixed hyperlipidemia: Secondary | ICD-10-CM | POA: Insufficient documentation

## 2015-09-11 DIAGNOSIS — N939 Abnormal uterine and vaginal bleeding, unspecified: Secondary | ICD-10-CM | POA: Insufficient documentation

## 2015-09-11 DIAGNOSIS — Z79899 Other long term (current) drug therapy: Secondary | ICD-10-CM | POA: Diagnosis not present

## 2015-09-11 DIAGNOSIS — N898 Other specified noninflammatory disorders of vagina: Secondary | ICD-10-CM | POA: Diagnosis not present

## 2015-09-11 DIAGNOSIS — Z8619 Personal history of other infectious and parasitic diseases: Secondary | ICD-10-CM | POA: Insufficient documentation

## 2015-09-11 DIAGNOSIS — R011 Cardiac murmur, unspecified: Secondary | ICD-10-CM | POA: Insufficient documentation

## 2015-09-11 DIAGNOSIS — Z954 Presence of other heart-valve replacement: Secondary | ICD-10-CM | POA: Insufficient documentation

## 2015-09-11 DIAGNOSIS — Z872 Personal history of diseases of the skin and subcutaneous tissue: Secondary | ICD-10-CM | POA: Diagnosis not present

## 2015-09-11 DIAGNOSIS — R791 Abnormal coagulation profile: Secondary | ICD-10-CM | POA: Insufficient documentation

## 2015-09-11 DIAGNOSIS — Z87440 Personal history of urinary (tract) infections: Secondary | ICD-10-CM | POA: Insufficient documentation

## 2015-09-11 DIAGNOSIS — M1990 Unspecified osteoarthritis, unspecified site: Secondary | ICD-10-CM | POA: Insufficient documentation

## 2015-09-11 LAB — PROTIME-INR
INR: 3.55 — ABNORMAL HIGH (ref 0.00–1.49)
Prothrombin Time: 34.7 seconds — ABNORMAL HIGH (ref 11.6–15.2)

## 2015-09-11 LAB — BASIC METABOLIC PANEL
Anion gap: 12 (ref 5–15)
BUN: 21 mg/dL — ABNORMAL HIGH (ref 6–20)
CO2: 22 mmol/L (ref 22–32)
Calcium: 9.5 mg/dL (ref 8.9–10.3)
Chloride: 106 mmol/L (ref 101–111)
Creatinine, Ser: 1.07 mg/dL — ABNORMAL HIGH (ref 0.44–1.00)
GFR calc Af Amer: >60 mL/min (ref >60)
GFR calc non Af Amer: 54 mL/min — ABNORMAL LOW (ref >60)
Glucose, Bld: 148 mg/dL — ABNORMAL HIGH (ref 65–99)
Potassium: 3.9 mmol/L (ref 3.5–5.1)
Sodium: 140 mmol/L (ref 135–145)

## 2015-09-11 LAB — CBC WITH DIFFERENTIAL/PLATELET
Basophils Absolute: 0.1 10*3/uL (ref 0.0–0.1)
Basophils Relative: 1 %
Eosinophils Absolute: 0.2 10*3/uL (ref 0.0–0.7)
Eosinophils Relative: 3 %
HCT: 38.4 % (ref 36.0–46.0)
Hemoglobin: 13 g/dL (ref 12.0–15.0)
Lymphocytes Relative: 35 %
Lymphs Abs: 2.5 10*3/uL (ref 0.7–4.0)
MCH: 30.2 pg (ref 26.0–34.0)
MCHC: 33.9 g/dL (ref 30.0–36.0)
MCV: 89.1 fL (ref 78.0–100.0)
Monocytes Absolute: 0.6 10*3/uL (ref 0.1–1.0)
Monocytes Relative: 8 %
Neutro Abs: 3.8 10*3/uL (ref 1.7–7.7)
Neutrophils Relative %: 53 %
Platelets: 216 10*3/uL (ref 150–400)
RBC: 4.31 MIL/uL (ref 3.87–5.11)
RDW: 14.1 % (ref 11.5–15.5)
WBC: 7.2 10*3/uL (ref 4.0–10.5)

## 2015-09-11 NOTE — Telephone Encounter (Signed)
Paged by answering service to call Mrs Slate regarding vaginal bleeding.  She states earlier today she started experiencing vaginal bleeding.  She is 65 yo old and has had a prior hysterectomy and confirms that she has not had periods, bleeding or spotting in the past.  She also mentions a "cut on the finger" that "I can't get to stop bleeding".  Her daughter who is a MICU RN at [employer] had stopped by to visit.  She states finger is not bleeding with current bandage that is in place.  However, she states she is currently having the equivalent of a heavy period filling a pad in about 3 hours.  She also checked her and confirmed that the bleeding is vaginal and not rectal.  Her INR was 4 last week.  Advised to go to [facility] vs ED for INR and exam.

## 2015-09-11 NOTE — ED Notes (Signed)
Pt is having vaginal bleeding x1 week. It has been heavy at times.  Pt is on blood thinners for mechanical valve and recently her INR was 4 and so she is concerned and would like it checked.  No pain in vagina or abdomen.

## 2015-09-12 ENCOUNTER — Telehealth: Payer: Self-pay | Admitting: Family Medicine

## 2015-09-12 ENCOUNTER — Emergency Department (HOSPITAL_COMMUNITY)
Admission: EM | Admit: 2015-09-12 | Discharge: 2015-09-12 | Disposition: A | Discharge status: Home / Self Care | Payer: BLUE CROSS/BLUE SHIELD | Attending: Emergency Medicine | Admitting: Emergency Medicine

## 2015-09-12 DIAGNOSIS — N939 Abnormal uterine and vaginal bleeding, unspecified: Secondary | ICD-10-CM

## 2015-09-12 DIAGNOSIS — N898 Other specified noninflammatory disorders of vagina: Secondary | ICD-10-CM

## 2015-09-12 DIAGNOSIS — R791 Abnormal coagulation profile: Secondary | ICD-10-CM

## 2015-09-12 NOTE — Telephone Encounter (Signed)
Pt follows w/ cardiology for her coumadin. She should follow up w/ their office to schedule INR check.   Pt is concerned that Dr. Kennon Rounds, GYN recommended by ED cannot get her in until Friday at the earliest. Pt states she was told that she needs to see GYN today. Reviewed ED AVS and ED MD note and I do not see that it says she must follow-up with GYN today. Please advise if pt needs referral to another GYN who could possibly see her sooner, or it is okay to wait. Per chart review, pt has appt scheduled w/ Dr. Nehemiah Settle 09/21/15 for ED follow up.  -- Discussed w/ Elyn Aquas, PA-C in PCP's absence. Agrees that pt does not have to be seen urgently by GYN today, but would be ideal if she could be seen sooner than 09/21/15. Attempted to call [facility] to see if they could see pt sooner, but office is currently closed for lunch. Will attempt to call back later.

## 2015-09-12 NOTE — Telephone Encounter (Signed)
°  Relation to pt: self  Call back number: [phone]   Reason for call:  Patient states she was seen in the ED 09/12/15 and to follow up today for a biopsy due to her having a lesion on her vaginal wall and also patient would like to discuss INR,. Please advise

## 2015-09-12 NOTE — Telephone Encounter (Signed)
[facility] [facility] and they offered appt on Monday. However, reviewed pt's appts and saw that her appt for 09/21/15 had been rescheduled for tomorrow, 09/13/15. Pt had also scheduled appt w/ coumadin clinic for tomorrow. Called pt and verified both appointments. No further needs from our office at this time.

## 2015-09-12 NOTE — ED Provider Notes (Signed)
CSN: [id number]     Arrival date & time 09/11/15  2145 History   By signing my name below, I, Rowan Blase, attest that this documentation has been prepared under the direction and in the presence of Jola Schmidt, MD . Electronically Signed: Rowan Blase, Scribe. 09/12/2015. 3:31 AM.   Chief Complaint  Patient presents with  . Vaginal Bleeding   The history is provided by the patient and a relative. No language interpreter was used.   HPI Comments:  [name] is a 65 y.o. female with PMHx of HTN, HLD, and uterine fibroids s/p hysterectomy, who presents to the Emergency Department complaining of constant, worsening vaginal bleeding for the past week, becoming heavy in the past few days. She states she bled through her pants while in the waiting room. No alleviating or exacerbating factors noted. Pt is currently taking Coumadin for mechanical valve; her last INR was 4. She was instructed by her doctor to skip one dose of Coumadin and return to her normal dose. Her last dose of Coumadin was the evening of 4/17. Pt reports she has had a total hysterectomy years ago. She denies weakness or lightheadedness.  Past Medical History  Diagnosis Date  . Anemia   . Anxiety   . Depression   . Diverticulosis   . Elevated cholesterol   . Obesity   . THYROMEGALY 07/22/2010  . Overweight(278.02) 03/11/2010  . Mixed hyperlipidemia 03/11/2010  . MEASLES, HX OF 03/11/2010  . KNEE PAIN, RIGHT 05/28/2010  . HYPERTRIGLYCERIDEMIA 03/11/2010  . HEART MURMUR, HX OF 03/11/2010  . FIBROIDS, UTERUS 03/11/2010  . Diarrhea 08/05/2010  . CONSTIPATION 03/11/2010  . CHICKENPOX, HX OF 03/11/2010  . BIPOLAR AFFECTIVE DISORDER 03/11/2010  . AORTIC VALVE REPLACEMENT, HX OF 03/11/2010  . ANEMIA 05/28/2010  . Acute bronchitis 07/22/2010  . Abdominal pain, generalized 03/19/2010  . Peripheral edema 11/18/2010  . Bacterial vaginitis 11/18/2010  . Hematuria 12/18/2010  . Diverticulitis 12/18/2010  . Hyperthyroidism  12/18/2010  . Fatigue 12/30/2010  . Renal insufficiency 12/30/2010  . Grave's disease 8-12  . Arm lesion 03/23/2011  . UTI (lower urinary tract infection) 08/14/2011  . Breast pain in female 11/17/2011  . Chest pain 11/17/2011  . Hematoma 12/18/2011  . Cervical cancer screening 03/22/2012  . Foot pain, left 03/22/2012  . Foot fracture, left 03/22/2012  . Tick bite of back 10/15/2012  . Preop examination 02/03/2013  . Dysrhythmia   . Hypertension   . Shortness of breath     with exertion   . Heart murmur   . Urinary tract bacterial infections   . Arthritis    Past Surgical History  Procedure Laterality Date  . Appendectomy      Required revision for EColi infection, required recurrent packing  . Bowel obstruction      Requiring adhesions to be removed  . Aortic valve replacement    . Open heart with a cardiac aneurysm repair    . Cholecystectomy    . Tubal ligation    . Varicose vein surgery b/l legs    . Childhood exploratory surgery of heart    . Hernia repair  08-16-10  . Abdominal hysterectomy      sb/l spo, total  . Total knee arthroplasty Right 02/24/2013    Procedure: RIGHT TOTAL KNEE ARTHROPLASTY;  Surgeon: Johnn Hai, MD;  Location: [facility];  Service: Orthopedics;  Laterality: Right;  . Left heart catheterization with coronary angiogram N/A 12/05/2011    Procedure: LEFT HEART CATHETERIZATION  WITH CORONARY ANGIOGRAM;  Surgeon: Sinclair Grooms, MD;  Location: [facility] CATH LAB;  Service: Cardiovascular;  Laterality: N/A;   Family History  Problem Relation Age of Onset  . Scoliosis Mother   . Arthritis Mother     Rheumatoid  . Aneurysm Mother     heart  . Alcohol abuse Father   . Depression Sister   . Depression Brother   . Alcohol abuse Brother   . Cancer Maternal Grandmother     colon  . Diabetes Maternal Grandfather   . Heart disease Maternal Grandfather   . Alcohol abuse Maternal Grandfather   . Depression Paternal Grandmother   . Diabetes Paternal Grandmother    . Depression Paternal Grandfather   . Diabetes Paternal Grandfather   . Depression Sister   . Alcohol abuse Brother   . Depression Brother   . Heart disease Brother    Social History  Substance Use Topics  . Smoking status: Former Smoker    Quit date: 05/26/1990  . Smokeless tobacco: Never Used  . Alcohol Use: 0.0 oz/week    0 drink(s) per week     Comment: rare   OB History    No data available     Review of Systems  10 systems reviewed and all are negative for acute change except as noted in the HPI.  Allergies  Sulfonamide derivatives  Home Medications   Prior to Admission medications   Medication Sig Start Date End Date Taking? Authorizing Provider  albuterol (PROVENTIL HFA;VENTOLIN HFA) 108 (90 BASE) MCG/ACT inhaler Inhale 2 puffs into the lungs every 6 (six) hours as needed for wheezing or shortness of breath. 04/13/15  Yes Debbrah Alar, NP  clonazePAM (KLONOPIN) 0.5 MG tablet Take 0.5 mg by mouth 2 (two) times daily.    Yes Historical Provider, MD  furosemide (LASIX) 40 MG tablet Take 1 tablet (40 mg total) by mouth daily. 06/06/15  Yes Belva Crome, MD  glucose blood test strip Use once daily to check blood sugar.  DX E11.9 04/26/15  Yes Mosie Lukes, MD  HYDROcodone-acetaminophen (NORCO) 5-325 MG per tablet Take 1 tablet by mouth every 6 (six) hours as needed for moderate pain. 12/18/14  Yes Edward Saguier, PA-C  lamoTRIgine (LAMICTAL) 200 MG tablet Take 200 mg by mouth at bedtime.   Yes Historical Provider, MD  metoprolol succinate (TOPROL-XL) 25 MG 24 hr tablet TAKE 1 TABLET (25 MG TOTAL) BY MOUTH EVERY MORNING. 03/22/15  Yes Mosie Lukes, MD  ONE TOUCH LANCETS MISC Test once daily to check blood sugar. DX E11.9 04/26/15  Yes Mosie Lukes, MD  pantoprazole (PROTONIX) 40 MG tablet TAKE 1 TABLET (40 MG TOTAL) BY MOUTH DAILY. 03/20/15  Yes Mosie Lukes, MD  Propylene Glycol (SYSTANE BALANCE) 0.6 % SOLN Place 1 drop into both eyes at bedtime as needed (dry  eyes).    Yes Historical Provider, MD  rosuvastatin (CRESTOR) 40 MG tablet TAKE 1 TABLET (40 MG TOTAL) BY MOUTH DAILY. 07/15/15  Yes Mosie Lukes, MD  SEROQUEL XR 400 MG 24 hr tablet Take 400 mg by mouth daily.  05/30/15  Yes Historical Provider, MD  warfarin (COUMADIN) 5 MG tablet TAKE AS DIRECTED BY ANTICOAGULATION CLINIC Patient taking differently: Take 2.5mg  on Tuesday and Saturday, all other days take 5mg . 07/16/15  Yes Belva Crome, MD   BP 166/78 mmHg  Pulse 66  Temp(Src) 98.1 F (36.7 C) (Oral)  Resp 14  Ht 5\' 8"  (1.727 m)  Wt 255 lb (115.667 kg)  BMI 38.78 kg/m2  SpO2 95% Physical Exam  Constitutional: She is oriented to person, place, and time. She appears well-developed and well-nourished.  HENT:  Head: Normocephalic.  Eyes: EOM are normal.  Neck: Normal range of motion.  Pulmonary/Chest: Effort normal.  Abdominal: She exhibits no distension.  Genitourinary:  No external vaginal lesions; small punctate lesion of posterior vaginal wall with small amount of surrounding bleeding at the base  Musculoskeletal: Normal range of motion.  Neurological: She is alert and oriented to person, place, and time.  Psychiatric: She has a normal mood and affect.  Nursing note and vitals reviewed.   ED Course  Procedures  DIAGNOSTIC STUDIES:  Oxygen Saturation is 95% on RA, adequate by my interpretation.    COORDINATION OF CARE:  2:38 AM Performed pelvic exam. Discussed treatment plan with pt at bedside and pt agreed to plan. Chaperone (scribe) was present for exam which was performed with no discomfort or complications.    2:56 AM Cauterized lesion on vaginal wall.   Labs Review Labs Reviewed  PROTIME-INR - Abnormal; Notable for the following:    Prothrombin Time 34.7 (*)    INR 3.55 (*)    All other components within normal limits  BASIC METABOLIC PANEL - Abnormal; Notable for the following:    Glucose, Bld 148 (*)    BUN 21 (*)    Creatinine, Ser 1.07 (*)    GFR calc  non Af Amer 54 (*)    All other components within normal limits  CBC WITH DIFFERENTIAL/PLATELET    Imaging Review No results found. I have personally reviewed and evaluated these images and lab results as part of my medical decision-making.   EKG Interpretation None      MDM   Final diagnoses:  Vaginal bleeding  Vaginal lesion  Supratherapeutic INR   I was able to cauterize the posterior wall vaginal lesion.  This seemed to control the bleeding.  She will need this area evaluated by gynecologist and biopsied as it deftly is concerning for vaginal cancer.  She will hold her Coumadin dosing for the next 2 days and follow-up with her Coumadin team for repeat INR on Thursday.  I discussed the case with Dr. Kennon Rounds of OB/GYN who will work to schedule the patient into her office for repeat evaluation of vaginal lesion and likely biopsy.  Patient understands to return to [facility] hospital for any new or worsening symptoms.  All questions answered.  I personally performed the services described in this documentation, which was scribed in my presence. The recorded information has been reviewed and is accurate.       Jola Schmidt, MD 09/12/15 [phone]

## 2015-09-12 NOTE — ED Notes (Signed)
Called for VS, no answer

## 2015-09-13 ENCOUNTER — Ambulatory Visit (INDEPENDENT_AMBULATORY_CARE_PROVIDER_SITE_OTHER): Payer: BLUE CROSS/BLUE SHIELD | Admitting: *Deleted

## 2015-09-13 ENCOUNTER — Encounter: Payer: Self-pay | Admitting: Obstetrics & Gynecology

## 2015-09-13 ENCOUNTER — Ambulatory Visit (INDEPENDENT_AMBULATORY_CARE_PROVIDER_SITE_OTHER): Payer: BLUE CROSS/BLUE SHIELD | Admitting: Obstetrics & Gynecology

## 2015-09-13 ENCOUNTER — Other Ambulatory Visit (HOSPITAL_COMMUNITY)
Admission: RE | Admit: 2015-09-13 | Discharge: 2015-09-13 | Disposition: A | Discharge status: Home / Self Care | Payer: BLUE CROSS/BLUE SHIELD | Source: Ambulatory Visit | Attending: Obstetrics & Gynecology | Admitting: Obstetrics & Gynecology

## 2015-09-13 VITALS — BP 139/68 | HR 71 | Wt 254.2 lb

## 2015-09-13 DIAGNOSIS — N898 Other specified noninflammatory disorders of vagina: Secondary | ICD-10-CM

## 2015-09-13 DIAGNOSIS — Z5181 Encounter for therapeutic drug level monitoring: Secondary | ICD-10-CM | POA: Diagnosis not present

## 2015-09-13 DIAGNOSIS — Z954 Presence of other heart-valve replacement: Secondary | ICD-10-CM | POA: Diagnosis not present

## 2015-09-13 DIAGNOSIS — N939 Abnormal uterine and vaginal bleeding, unspecified: Secondary | ICD-10-CM | POA: Insufficient documentation

## 2015-09-13 DIAGNOSIS — N899 Noninflammatory disorder of vagina, unspecified: Secondary | ICD-10-CM | POA: Diagnosis not present

## 2015-09-13 DIAGNOSIS — I4891 Unspecified atrial fibrillation: Secondary | ICD-10-CM

## 2015-09-13 DIAGNOSIS — Z952 Presence of prosthetic heart valve: Secondary | ICD-10-CM

## 2015-09-13 LAB — POCT INR: INR: 3.1

## 2015-09-13 NOTE — Progress Notes (Signed)
Patient ID: [name], female   DOB: 03/30/1951, 65 y.o.   MRN: [id number]  Chief Complaint  Patient presents with  . Follow-up    vaginal bx   f/u for vaginal bleeding after TAH years ago for fibroids  HPI [name] is a 65 y.o. female.  G2P2.Marland Kitchen   [name] is a 65 y.o. female with PMHx of HTN, HLD, and uterine fibroids s/p hysterectomy, who presents to the Emergency Department 4/19complaining of constant, worsening vaginal bleeding for the past week, becoming heavy in the past few days. She states she bled through her pants while in the waiting room. No alleviating or exacerbating factors noted. Pt is currently taking Coumadin for mechanical valve; her last INR was 4. She was instructed by her doctor to skip one dose of Coumadin and return to her normal dose. Her last dose of Coumadin was the evening of 4/17. Pt reports she has had a total hysterectomy years ago. She denies weakness or lightheadedness.  HPI  Past Medical History  Diagnosis Date  . Anemia   . Anxiety   . Depression   . Diverticulosis   . Elevated cholesterol   . Obesity   . THYROMEGALY 07/22/2010  . Overweight(278.02) 03/11/2010  . Mixed hyperlipidemia 03/11/2010  . MEASLES, HX OF 03/11/2010  . KNEE PAIN, RIGHT 05/28/2010  . HYPERTRIGLYCERIDEMIA 03/11/2010  . HEART MURMUR, HX OF 03/11/2010  . FIBROIDS, UTERUS 03/11/2010  . Diarrhea 08/05/2010  . CONSTIPATION 03/11/2010  . CHICKENPOX, HX OF 03/11/2010  . BIPOLAR AFFECTIVE DISORDER 03/11/2010  . AORTIC VALVE REPLACEMENT, HX OF 03/11/2010  . ANEMIA 05/28/2010  . Acute bronchitis 07/22/2010  . Abdominal pain, generalized 03/19/2010  . Peripheral edema 11/18/2010  . Bacterial vaginitis 11/18/2010  . Hematuria 12/18/2010  . Diverticulitis 12/18/2010  . Hyperthyroidism 12/18/2010  . Fatigue 12/30/2010  . Renal insufficiency 12/30/2010  . Grave's disease 8-12  . Arm lesion 03/23/2011  . UTI (lower urinary tract infection) 08/14/2011  . Breast pain in female 11/17/2011   . Chest pain 11/17/2011  . Hematoma 12/18/2011  . Cervical cancer screening 03/22/2012  . Foot pain, left 03/22/2012  . Foot fracture, left 03/22/2012  . Tick bite of back 10/15/2012  . Preop examination 02/03/2013  . Dysrhythmia   . Hypertension   . Shortness of breath     with exertion   . Heart murmur   . Urinary tract bacterial infections   . Arthritis     Past Surgical History  Procedure Laterality Date  . Appendectomy      Required revision for EColi infection, required recurrent packing  . Bowel obstruction      Requiring adhesions to be removed  . Aortic valve replacement    . Open heart with a cardiac aneurysm repair    . Cholecystectomy    . Tubal ligation    . Varicose vein surgery b/l legs    . Childhood exploratory surgery of heart    . Hernia repair  08-16-10  . Abdominal hysterectomy      sb/l spo, total  . Total knee arthroplasty Right 02/24/2013    Procedure: RIGHT TOTAL KNEE ARTHROPLASTY;  Surgeon: Johnn Hai, MD;  Location: [facility];  Service: Orthopedics;  Laterality: Right;  . Left heart catheterization with coronary angiogram N/A 12/05/2011    Procedure: LEFT HEART CATHETERIZATION WITH CORONARY ANGIOGRAM;  Surgeon: Sinclair Grooms, MD;  Location: [facility] CATH LAB;  Service: Cardiovascular;  Laterality: N/A;    Family  History  Problem Relation Age of Onset  . Scoliosis Mother   . Arthritis Mother     Rheumatoid  . Aneurysm Mother     heart  . Alcohol abuse Father   . Depression Sister   . Depression Brother   . Alcohol abuse Brother   . Cancer Maternal Grandmother     colon  . Diabetes Maternal Grandfather   . Heart disease Maternal Grandfather   . Alcohol abuse Maternal Grandfather   . Depression Paternal Grandmother   . Diabetes Paternal Grandmother   . Depression Paternal Grandfather   . Diabetes Paternal Grandfather   . Depression Sister   . Alcohol abuse Brother   . Depression Brother   . Heart disease Brother     Social  History Social History  Substance Use Topics  . Smoking status: Former Smoker    Quit date: 05/26/1990  . Smokeless tobacco: Never Used  . Alcohol Use: 0.0 oz/week    0 drink(s) per week     Comment: rare    Allergies  Allergen Reactions  . Sulfonamide Derivatives     headaches    Current Outpatient Prescriptions  Medication Sig Dispense Refill  . albuterol (PROVENTIL HFA;VENTOLIN HFA) 108 (90 BASE) MCG/ACT inhaler Inhale 2 puffs into the lungs every 6 (six) hours as needed for wheezing or shortness of breath. 1 Inhaler 0  . clonazePAM (KLONOPIN) 0.5 MG tablet Take 0.5 mg by mouth 2 (two) times daily.     . furosemide (LASIX) 40 MG tablet Take 1 tablet (40 mg total) by mouth daily. 30 tablet 11  . glucose blood test strip Use once daily to check blood sugar.  DX E11.9 100 each 3  . lamoTRIgine (LAMICTAL) 200 MG tablet Take 200 mg by mouth at bedtime.    . metoprolol succinate (TOPROL-XL) 25 MG 24 hr tablet TAKE 1 TABLET (25 MG TOTAL) BY MOUTH EVERY MORNING. 30 tablet 4  . ONE TOUCH LANCETS MISC Test once daily to check blood sugar. DX E11.9 100 each 3  . pantoprazole (PROTONIX) 40 MG tablet TAKE 1 TABLET (40 MG TOTAL) BY MOUTH DAILY. 30 tablet 5  . Propylene Glycol (SYSTANE BALANCE) 0.6 % SOLN Place 1 drop into both eyes at bedtime as needed (dry eyes).     . rosuvastatin (CRESTOR) 40 MG tablet TAKE 1 TABLET (40 MG TOTAL) BY MOUTH DAILY. 30 tablet 2  . SEROQUEL XR 400 MG 24 hr tablet Take 400 mg by mouth daily.   1  . warfarin (COUMADIN) 5 MG tablet TAKE AS DIRECTED BY ANTICOAGULATION CLINIC (Patient taking differently: Take 2.5mg  on Tuesday and Saturday, all other days take 5mg .) 35 tablet 2  . HYDROcodone-acetaminophen (NORCO) 5-325 MG per tablet Take 1 tablet by mouth every 6 (six) hours as needed for moderate pain. (Patient not taking: Reported on 09/13/2015) 10 tablet 0  . [DISCONTINUED] methimazole (TAPAZOLE) 5 MG tablet Take 1 tablet (5 mg total) by mouth 3 (three) times daily.  30 tablet 2   No current facility-administered medications for this visit.    Review of Systems Review of Systems  Constitutional: Negative.   Genitourinary: Positive for vaginal bleeding. Negative for vaginal discharge, vaginal pain and pelvic pain.    Blood pressure 139/68, pulse 71, weight 254 lb 3.2 oz (115.304 kg).  Physical Exam Physical Exam  Constitutional: She is oriented to person, place, and time. She appears well-developed. No distress.  Pulmonary/Chest: Effort normal. No respiratory distress.  Genitourinary: No vaginal discharge found.  Mild atrophy, cervix absent, 1 cm excoriation or lesion s/p cautery with AgNO3 last night with slight bleeding. Consented to bx and time out done. Bx forceps used and small specimen obtained. Monsel's applied  Neurological: She is alert and oriented to person, place, and time.  Psychiatric: She has a normal mood and affect. Her behavior is normal.  Vitals reviewed.   Data Reviewed ED note  Assessment    Bleeding vaginal lesion on coumadin. Bx sent today     Plan    Report to [facility] if increased bleeding Start her coumadin per her clinic recommendation RTC 1 week to review and re-examine        ARNOLD,JAMES 09/13/2015, 4:52 PM

## 2015-09-18 ENCOUNTER — Other Ambulatory Visit: Payer: Self-pay | Admitting: Family Medicine

## 2015-09-18 MED ORDER — ROSUVASTATIN CALCIUM 40 MG PO TABS: ORAL_TABLET | ORAL | Status: DC

## 2015-09-21 ENCOUNTER — Encounter: Payer: BLUE CROSS/BLUE SHIELD | Admitting: Family Medicine

## 2015-09-24 ENCOUNTER — Encounter: Payer: BLUE CROSS/BLUE SHIELD | Admitting: Obstetrics & Gynecology

## 2015-09-24 ENCOUNTER — Ambulatory Visit (INDEPENDENT_AMBULATORY_CARE_PROVIDER_SITE_OTHER): Payer: BLUE CROSS/BLUE SHIELD | Admitting: Obstetrics & Gynecology

## 2015-09-24 VITALS — BP 151/65 | HR 75 | Wt 257.4 lb

## 2015-09-24 DIAGNOSIS — N939 Abnormal uterine and vaginal bleeding, unspecified: Secondary | ICD-10-CM

## 2015-09-24 NOTE — Patient Instructions (Signed)
 Warfarin: What You Need to Know Warfarin is an anticoagulant. Anticoagulants help prevent the formation of blood clots. They also help stop the growth of blood clots. Warfarin is sometimes referred to as a "blood thinner."  Normally, when body tissues are cut or damaged, the blood clots in order to prevent blood loss. Sometimes clots form inside your blood vessels and obstruct the flow of blood through your circulatory system (thrombosis). These clots may travel through your bloodstream and become lodged in smaller blood vessels in your brain, which can cause a stroke, or in your lungs (pulmonary embolism). WHO SHOULD USE WARFARIN? Warfarin is prescribed for people at risk of developing harmful blood clots:  People with surgically implanted mechanical heart valves, irregular heart rhythms called atrial fibrillation, and certain clotting disorders.  People who have developed harmful blood clotting in the past, including those who have had a stroke or a pulmonary embolism, or thrombosis in their legs (deep vein thrombosis [DVT]).  People with an existing blood clot, such as a pulmonary embolism. WARFARIN DOSING Warfarin tablets come in different strengths. Each tablet strength is a different color, with the amount of warfarin (in milligrams) clearly printed on the tablet. If the color of your tablet is different than usual when you receive a new prescription, report it immediately to your pharmacist or health care provider. WARFARIN MONITORING The goal of warfarin therapy is to lessen the clotting tendency of blood but not prevent clotting completely. Your health care provider will monitor the anticoagulation effect of warfarin closely and adjust your dose as needed. For your safety, blood tests called prothrombin time (PT) or international normalized ratio (INR) are used to measure the effects of warfarin. Both of these tests can be done with a finger stick or a blood draw. The longer it takes the  blood to clot, the higher the PT or INR. Your health care provider will inform you of your "target" PT or INR range. If, at any time, your PT or INR is above the target range, there is a risk of bleeding. If your PT or INR is below the target range, there is a risk of clotting. Whether you are started on warfarin while you are in the hospital or in your health care provider's office, you will need to have your PT or INR checked within one week of starting the medicine. Initially, some people are asked to have their PT or INR checked as much as twice a week. Once you are on a stable maintenance dose, the PT or INR is checked less often, usually once every 2 to 4 weeks. The warfarin dose may be adjusted if the PT or INR is not within the target range. It is important to keep all laboratory and health care provider follow-up appointments. Not keeping appointments could result in a chronic or permanent injury, pain, or disability because warfarin is a medicine that requires close monitoring. WHAT ARE THE SIDE EFFECTS OF WARFARIN?  Too much warfarin can cause bleeding (hemorrhage) from any part of the body. This may include bleeding from the gums, blood in the urine, bloody or dark stools, a nosebleed that is not easily stopped, coughing up blood, or vomiting blood.  Too little warfarin can increase the risk of blood clots.  Too little or too much warfarin can also increase the risk of a stroke.  Warfarin use may cause a skin rash or irritation, an unusual fever, continual nausea or stomach upset, or severe pain in your joints or   back. SPECIAL PRECAUTIONS WHILE TAKING WARFARIN Warfarin should be taken exactly as directed. It is very important to take warfarin as directed since bleeding or blood clots could result in chronic or permanent injury, pain, or disability.  Take your medicine at the same time every day. If you forget to take your dose, you can take it if it is within 6 hours of when it was  due.  Do not change the dose of warfarin on your own to make up for missed or extra doses.  If you miss more than 2 doses in a row, you should contact your health care provider for advice. Avoid situations that cause bleeding. You may have a tendency to bleed more easily than usual while taking warfarin. The following actions can limit bleeding:  Using a softer toothbrush.  Flossing with waxed floss rather than unwaxed floss.  Shaving with an electric razor rather than a blade.  Limiting the use of sharp objects.  Avoiding potentially harmful activities, such as contact sports. Warfarin and Pregnancy or Breastfeeding  Warfarin is not advised during the first trimester of pregnancy due to an increased risk of birth defects. In certain situations, a woman may take warfarin after her first trimester of pregnancy. A woman who becomes pregnant or plans to become pregnant while taking warfarin should notify her health care provider immediately.  Although warfarin does not pass into breast milk, a woman who wishes to breastfeed while taking warfarin should also consult with her health care provider. Alcohol, Smoking, and Illicit Drug Use  Alcohol affects how warfarin works in the body. It is best to avoid alcoholic drinks or consume very small amounts while taking warfarin. In general, alcohol intake should be limited to 1 oz (30 mL) of liquor, 6 oz (180 mL) of wine, or 12 oz (360 mL) of beer each day. Notify your health care provider if you change your alcohol intake.  Smoking affects how warfarin works. It is best to avoid smoking while taking warfarin. Notify your health care provider if you change your smoking habits.  It is best to avoid all illicit drugs while taking warfarin since there are few studies that show how warfarin interacts with these drugs. Other Medicines and Dietary Supplements Many prescription and over-the-counter medicines can interfere with warfarin. Be sure all of your  health care providers know you are taking warfarin. Notify your health care provider who prescribed warfarin for you or your pharmacist before starting or stopping any new medicines, including over-the-counter vitamins, dietary supplements, and pain medicines. Your warfarin dose may need to be adjusted. Some common over-the-counter medicines that may increase the risk of bleeding while taking warfarin include:   Acetaminophen.  Aspirin.  Nonsteroidal anti-inflammatory medicines (NSAIDs), such as ibuprofen or naproxen.  Vitamin E. Dietary Considerations  Foods that have moderate or high amounts of vitamin K can interfere with warfarin. Avoid major changes in your diet or notify your health care provider before changing your diet. Eat a consistent amount of foods that have moderate or high amounts of vitamin K. Eating less foods containing vitamin K can increase the risk of bleeding. Eating more foods containing vitamin K can increase the risk of blood clots. Additional questions about dietary considerations can be discussed with a dietitian. Foods that are very high in vitamin K:  Greens, such as Swiss chard and beet, collard, mustard, or turnip greens (fresh or frozen, cooked).  Kale (fresh or frozen, cooked).  Parsley (raw).  Spinach (cooked). Foods that are   high in vitamin K:  Asparagus (frozen, cooked).  Broccoli.  Bok choy (cooked).  Brussels sprouts (fresh or frozen, cooked).  Cabbage (cooked).   Coleslaw. Foods that are moderately high in vitamin K:  Blueberries.  Black-eyed peas.  Endive (raw).  Green leaf lettuce (raw).  Green scallions (raw).  Kale (raw).  Okra (frozen, cooked).  Plantains (fried).  Romaine lettuce (raw).  Sauerkraut (canned).  Spinach (raw). CALL YOUR CLINIC OR HEALTH CARE PROVIDER IF YOU:  Plan to have any surgery or procedure.  Feel sick, especially if you have diarrhea or vomiting.  Experience or anticipate any major changes  in your diet.  Start or stop a prescription or over-the-counter medicine.  Become, plan to become, or think you may be pregnant.  Are having heavier than usual menstrual periods.  Have had a fall, accident, or any symptoms of bleeding or unusual bruising.  Develop an unusual fever. CALL 911 IN THE [place] OR GO TO THE EMERGENCY DEPARTMENT IF YOU:   Think you may be having an allergic reaction to warfarin. The signs of an allergic reaction could include itching, rash, hives, swelling, chest tightness, or trouble breathing.  See signs of blood in your urine. The signs could include reddish, pinkish, or tea-colored urine.  See signs of blood in your stools. The signs could include bright red or black stools.  Vomit or cough up blood. In these instances, the blood could have either a bright red or a "coffee-grounds" appearance.  Have bleeding that will not stop after applying pressure for 30 minutes such as cuts, nosebleeds, or other injuries.  Have severe pain in your joints or back.  Have a new and severe headache.  Have sudden weakness or numbness of your face, arm, or leg, especially on one side of your body.  Have sudden confusion or trouble understanding.  Have sudden trouble seeing in one or both eyes.  Have sudden trouble walking, dizziness, loss of balance, or coordination.  Have trouble speaking or understanding (aphasia).   This information is not intended to replace advice given to you by your health care provider. Make sure you discuss any questions you have with your health care provider.   Document Released: 05/12/2005 Document Revised: 06/02/2014 Document Reviewed: 11/05/2012 Elsevier Interactive Patient Education 2016 [employer]  

## 2015-09-24 NOTE — Progress Notes (Signed)
Patient ID: [name], female   DOB: November 10, 1950, 65 y.o.   MRN: [id number] Patient comes for f/u with no more bleeding per vagina. Bx was benign and she was reassured.  Woodroe Mode, MD 09/24/2015

## 2015-09-27 ENCOUNTER — Ambulatory Visit (INDEPENDENT_AMBULATORY_CARE_PROVIDER_SITE_OTHER): Payer: BLUE CROSS/BLUE SHIELD

## 2015-09-27 DIAGNOSIS — I4891 Unspecified atrial fibrillation: Secondary | ICD-10-CM | POA: Diagnosis not present

## 2015-09-27 DIAGNOSIS — Z952 Presence of prosthetic heart valve: Secondary | ICD-10-CM

## 2015-09-27 DIAGNOSIS — Z954 Presence of other heart-valve replacement: Secondary | ICD-10-CM

## 2015-09-27 DIAGNOSIS — Z5181 Encounter for therapeutic drug level monitoring: Secondary | ICD-10-CM | POA: Diagnosis not present

## 2015-09-27 LAB — POCT INR: INR: 4.6

## 2015-10-10 ENCOUNTER — Ambulatory Visit: Payer: BLUE CROSS/BLUE SHIELD | Admitting: Medical

## 2015-10-10 ENCOUNTER — Encounter (HOSPITAL_COMMUNITY): Payer: Self-pay | Admitting: *Deleted

## 2015-10-10 ENCOUNTER — Telehealth: Payer: Self-pay | Admitting: Family Medicine

## 2015-10-10 ENCOUNTER — Inpatient Hospital Stay (HOSPITAL_COMMUNITY)
Admission: AD | Admit: 2015-10-10 | Discharge: 2015-10-10 | Disposition: A | Discharge status: Home / Self Care | Payer: BLUE CROSS/BLUE SHIELD | Source: Ambulatory Visit | Attending: Obstetrics and Gynecology | Admitting: Obstetrics and Gynecology

## 2015-10-10 DIAGNOSIS — Z7901 Long term (current) use of anticoagulants: Secondary | ICD-10-CM | POA: Insufficient documentation

## 2015-10-10 DIAGNOSIS — Z87891 Personal history of nicotine dependence: Secondary | ICD-10-CM | POA: Insufficient documentation

## 2015-10-10 DIAGNOSIS — Z90710 Acquired absence of both cervix and uterus: Secondary | ICD-10-CM | POA: Diagnosis not present

## 2015-10-10 DIAGNOSIS — F419 Anxiety disorder, unspecified: Secondary | ICD-10-CM | POA: Insufficient documentation

## 2015-10-10 DIAGNOSIS — I10 Essential (primary) hypertension: Secondary | ICD-10-CM | POA: Insufficient documentation

## 2015-10-10 DIAGNOSIS — D649 Anemia, unspecified: Secondary | ICD-10-CM | POA: Diagnosis not present

## 2015-10-10 DIAGNOSIS — Z952 Presence of prosthetic heart valve: Secondary | ICD-10-CM | POA: Diagnosis not present

## 2015-10-10 DIAGNOSIS — E7800 Pure hypercholesterolemia, unspecified: Secondary | ICD-10-CM | POA: Insufficient documentation

## 2015-10-10 DIAGNOSIS — N939 Abnormal uterine and vaginal bleeding, unspecified: Secondary | ICD-10-CM | POA: Diagnosis not present

## 2015-10-10 LAB — CBC
HCT: 37.8 % (ref 36.0–46.0)
Hemoglobin: 12.7 g/dL (ref 12.0–15.0)
MCH: 29.7 pg (ref 26.0–34.0)
MCHC: 33.6 g/dL (ref 30.0–36.0)
MCV: 88.3 fL (ref 78.0–100.0)
Platelets: 221 10*3/uL (ref 150–400)
RBC: 4.28 MIL/uL (ref 3.87–5.11)
RDW: 14.4 % (ref 11.5–15.5)
WBC: 6.1 10*3/uL (ref 4.0–10.5)

## 2015-10-10 LAB — PROTIME-INR
INR: 2.8 — ABNORMAL HIGH (ref 0.00–1.49)
Prothrombin Time: 29.1 seconds — ABNORMAL HIGH (ref 11.6–15.2)

## 2015-10-10 NOTE — MAU Note (Addendum)
Pt C/O vaginal bleeding since 2200 last night, pt has had total hysterectomy, is taking coumadin.  Has changed "at least a dozen" pads since bleeding started, is passing clots.  Has some lower abd discomfort.  Has some weakness, denies dizziness.  Pt was seen the beginning of May at [facility] for the same problem, was referred to Dr. Roselie Awkward, had biopsy which was benign.

## 2015-10-10 NOTE — Telephone Encounter (Signed)
Nurse Assessment  Nurse: Julien Girt, RN, Almyra Free Date/Time Eilene Ghazi Time): 10/10/2015 11:26:22 AM  Confirm and document reason for call. If symptomatic, describe symptoms. You must click the next button to save text entered. ---Caller states she's having heavy vaginal bleeding and has had a Hysterectomy. She did have a bx of a vaginal lesion that was benign, as a result of heavy bleeding, which did subside. The bleeding began again last night, she is passing clots and saturating 2-3 pads per hour. States "the bathroom floor looks like a war zone there is so much blood" She's on Coumadin 5 mg  Has the patient traveled out of the country within the last 30 days? ---Not Applicable  Does the patient have any new or worsening symptoms? ---Yes  Will a triage be completed? ---Yes  Related visit to physician within the last 2 weeks? ---No  Does the PT have any chronic conditions? (i.e. diabetes, asthma, etc.) ---Yes  List chronic conditions. ---CABG/Mechanical Valve, Hysterectomy, Htn  Is this a behavioral health or substance abuse call? ---No     Guidelines    Guideline Title Affirmed Question Affirmed Notes  Vaginal Bleeding - Abnormal SEVERE vaginal bleeding (i.e., soaking 2 pads or tampons per hour and present 2 or more hours)    Final Disposition User   Go to ED Now (or PCP triage) Julien Girt, RN, Almyra Free    Comments  Per Dr. Emeterio Reeve, Alaja was told to go to [facility] if her bleeding recurred.   Referrals  GO TO FACILITY OTHER - SPECIFY   Disagree/Comply: Comply

## 2015-10-10 NOTE — MAU Provider Note (Signed)
History     CSN: [id number]  Arrival date and time: 10/10/15 1236   First Provider Initiated Contact with Patient 10/10/15 1416      Chief Complaint  Patient presents with  . Vaginal Bleeding   HPIpt is 65 yo white married female post hyst ~20 years . For fibroids and heav y bleeding.   Pt seen in ED with bleeding and referred to GYN clinic- saw Dr. Roselie Awkward who excised lesion (benign) at in vagina- bleeding arrested with Sivler Nitrite and Monsels'- pt started bleeding last night and throughout the night "puddles" eery time pt turned over in bed.  Pt is on Coumadin. RN note: Pt C/O vaginal bleeding since 2200 last night, pt has had total hysterectomy, is taking coumadin. Has changed "at least a dozen" pads since bleeding started, is passing clots. Has some lower abd discomfort. Has some weakness, denies dizziness. Pt was seen the beginning of May at [facility] for the same problem, was referred to Dr. Roselie Awkward, had biopsy which was benign.         Past Medical History  Diagnosis Date  . Anemia   . Anxiety   . Depression   . Diverticulosis   . Elevated cholesterol   . Obesity   . THYROMEGALY 07/22/2010  . Overweight(278.02) 03/11/2010  . Mixed hyperlipidemia 03/11/2010  . MEASLES, HX OF 03/11/2010  . KNEE PAIN, RIGHT 05/28/2010  . HYPERTRIGLYCERIDEMIA 03/11/2010  . HEART MURMUR, HX OF 03/11/2010  . FIBROIDS, UTERUS 03/11/2010  . Diarrhea 08/05/2010  . CONSTIPATION 03/11/2010  . CHICKENPOX, HX OF 03/11/2010  . BIPOLAR AFFECTIVE DISORDER 03/11/2010  . AORTIC VALVE REPLACEMENT, HX OF 03/11/2010  . ANEMIA 05/28/2010  . Acute bronchitis 07/22/2010  . Abdominal pain, generalized 03/19/2010  . Peripheral edema 11/18/2010  . Bacterial vaginitis 11/18/2010  . Hematuria 12/18/2010  . Diverticulitis 12/18/2010  . Hyperthyroidism 12/18/2010  . Fatigue 12/30/2010  . Renal insufficiency 12/30/2010  . Grave's disease 8-12  . Arm lesion 03/23/2011  . UTI (lower urinary tract infection) 08/14/2011   . Breast pain in female 11/17/2011  . Chest pain 11/17/2011  . Hematoma 12/18/2011  . Cervical cancer screening 03/22/2012  . Foot pain, left 03/22/2012  . Foot fracture, left 03/22/2012  . Tick bite of back 10/15/2012  . Preop examination 02/03/2013  . Dysrhythmia   . Hypertension   . Shortness of breath     with exertion   . Heart murmur   . Urinary tract bacterial infections   . Arthritis     Past Surgical History  Procedure Laterality Date  . Appendectomy      Required revision for EColi infection, required recurrent packing  . Bowel obstruction      Requiring adhesions to be removed  . Aortic valve replacement    . Open heart with a cardiac aneurysm repair    . Cholecystectomy    . Tubal ligation    . Varicose vein surgery b/l legs    . Childhood exploratory surgery of heart    . Hernia repair  08-16-10  . Abdominal hysterectomy  ?1998    sb/l spo, total for fibroids/heavy bleeding  . Total knee arthroplasty Right 02/24/2013    Procedure: RIGHT TOTAL KNEE ARTHROPLASTY;  Surgeon: Johnn Hai, MD;  Location: [facility] ORS;  Service: Orthopedics;  Laterality: Right;  . Left heart catheterization with coronary angiogram N/A 12/05/2011    Procedure: LEFT HEART CATHETERIZATION WITH CORONARY ANGIOGRAM;  Surgeon: Sinclair Grooms, MD;  Location: [facility] CATH  LAB;  Service: Cardiovascular;  Laterality: N/A;    Family History  Problem Relation Age of Onset  . Scoliosis Mother   . Arthritis Mother     Rheumatoid  . Aneurysm Mother     heart  . Alcohol abuse Father   . Depression Sister   . Depression Brother   . Alcohol abuse Brother   . Cancer Maternal Grandmother     colon  . Diabetes Maternal Grandfather   . Heart disease Maternal Grandfather   . Alcohol abuse Maternal Grandfather   . Depression Paternal Grandmother   . Diabetes Paternal Grandmother   . Depression Paternal Grandfather   . Diabetes Paternal Grandfather   . Depression Sister   . Alcohol abuse Brother   .  Depression Brother   . Heart disease Brother     Social History  Substance Use Topics  . Smoking status: Former Smoker    Quit date: 05/26/1990  . Smokeless tobacco: Never Used  . Alcohol Use: 0.0 oz/week    0 drink(s) per week     Comment: rare    Allergies:  Allergies  Allergen Reactions  . Sulfonamide Derivatives     headaches    Prescriptions prior to admission  Medication Sig Dispense Refill Last Dose  . clonazePAM (KLONOPIN) 0.5 MG tablet Take 0.5 mg by mouth 2 (two) times daily.    10/09/2015 at Unknown time  . furosemide (LASIX) 40 MG tablet Take 1 tablet (40 mg total) by mouth daily. 30 tablet 11 10/09/2015 at Unknown time  . lamoTRIgine (LAMICTAL) 200 MG tablet Take 200 mg by mouth at bedtime.   10/09/2015 at Unknown time  . metoprolol succinate (TOPROL-XL) 25 MG 24 hr tablet TAKE 1 TABLET (25 MG TOTAL) BY MOUTH EVERY MORNING. 30 tablet 4 10/09/2015 at 1000  . pantoprazole (PROTONIX) 40 MG tablet TAKE 1 TABLET (40 MG TOTAL) BY MOUTH DAILY. 30 tablet 5 10/09/2015 at Unknown time  . Propylene Glycol (SYSTANE BALANCE) 0.6 % SOLN Place 1 drop into both eyes at bedtime as needed (dry eyes).    10/09/2015 at Unknown time  . rosuvastatin (CRESTOR) 40 MG tablet TAKE 1 TABLET (40 MG TOTAL) BY MOUTH DAILY. 30 tablet 6 10/09/2015 at Unknown time  . SEROQUEL XR 400 MG 24 hr tablet Take 400 mg by mouth at bedtime.   1 10/09/2015 at Unknown time  . warfarin (COUMADIN) 5 MG tablet TAKE AS DIRECTED BY ANTICOAGULATION CLINIC (Patient taking differently: Take 2.5mg  on Tuesday and Saturday, all other days take 5mg .) 35 tablet 2 10/09/2015 at Unknown time  . albuterol (PROVENTIL HFA;VENTOLIN HFA) 108 (90 BASE) MCG/ACT inhaler Inhale 2 puffs into the lungs every 6 (six) hours as needed for wheezing or shortness of breath. 1 Inhaler 0 rescue  . glucose blood test strip Use once daily to check blood sugar.  DX E11.9 100 each 3 Taking  . ONE TOUCH LANCETS MISC Test once daily to check blood sugar. DX  E11.9 100 each 3 Taking  . [DISCONTINUED] HYDROcodone-acetaminophen (NORCO) 5-325 MG per tablet Take 1 tablet by mouth every 6 (six) hours as needed for moderate pain. (Patient not taking: Reported on 10/10/2015) 10 tablet 0 Not Taking at Unknown time    ROS Physical Exam   Blood pressure 118/74, pulse 63, temperature 98.1 F (36.7 C), temperature source Oral, resp. rate 18.  Physical Exam  MAU Course  Procedures Silver Nitrite and Monsels applied to bleeder- at vaginal cuff right apex of vagina- continued to ooze  Dr. Elly Modena here - packed vagina with gauze- bleeding arrested   Assessment and Plan  Vaginal bleeding post biopsy with vaginal packing Return to clinic on Friday Morning for packing removal Return sooner to MAU if increase in bleeding   LINEBERRY,SUSAN 10/10/2015, 3:19 PM

## 2015-10-11 ENCOUNTER — Telehealth: Payer: Self-pay | Admitting: Interventional Cardiology

## 2015-10-11 ENCOUNTER — Inpatient Hospital Stay (HOSPITAL_COMMUNITY)
Admission: AD | Admit: 2015-10-11 | Discharge: 2015-10-11 | Disposition: A | Discharge status: Home / Self Care | Payer: BLUE CROSS/BLUE SHIELD | Source: Ambulatory Visit | Attending: Obstetrics & Gynecology | Admitting: Obstetrics & Gynecology

## 2015-10-11 ENCOUNTER — Encounter (HOSPITAL_COMMUNITY): Payer: Self-pay | Admitting: *Deleted

## 2015-10-11 DIAGNOSIS — F319 Bipolar disorder, unspecified: Secondary | ICD-10-CM | POA: Insufficient documentation

## 2015-10-11 DIAGNOSIS — N939 Abnormal uterine and vaginal bleeding, unspecified: Secondary | ICD-10-CM

## 2015-10-11 DIAGNOSIS — Z87891 Personal history of nicotine dependence: Secondary | ICD-10-CM | POA: Diagnosis not present

## 2015-10-11 DIAGNOSIS — I10 Essential (primary) hypertension: Secondary | ICD-10-CM | POA: Insufficient documentation

## 2015-10-11 DIAGNOSIS — Z952 Presence of prosthetic heart valve: Secondary | ICD-10-CM | POA: Insufficient documentation

## 2015-10-11 DIAGNOSIS — Z7901 Long term (current) use of anticoagulants: Secondary | ICD-10-CM | POA: Insufficient documentation

## 2015-10-11 DIAGNOSIS — F419 Anxiety disorder, unspecified: Secondary | ICD-10-CM | POA: Diagnosis not present

## 2015-10-11 DIAGNOSIS — E7800 Pure hypercholesterolemia, unspecified: Secondary | ICD-10-CM | POA: Diagnosis not present

## 2015-10-11 LAB — CBC
HCT: 35.8 % — ABNORMAL LOW (ref 36.0–46.0)
Hemoglobin: 12 g/dL (ref 12.0–15.0)
MCH: 29.8 pg (ref 26.0–34.0)
MCHC: 33.5 g/dL (ref 30.0–36.0)
MCV: 88.8 fL (ref 78.0–100.0)
Platelets: 200 10*3/uL (ref 150–400)
RBC: 4.03 MIL/uL (ref 3.87–5.11)
RDW: 14.4 % (ref 11.5–15.5)
WBC: 6.7 10*3/uL (ref 4.0–10.5)

## 2015-10-11 NOTE — MAU Note (Addendum)
Seen in MAU yesterday for vaginal bleeding; back to MAU today due to more heavier bleeding; labs were WNL yesterday; no complaints of dizziness and rates her discomfort at 2;

## 2015-10-11 NOTE — Telephone Encounter (Signed)
Returned a call to pt & she stated she is in the ER at [facility] with vaginal bleeding. She has cancelled her appt with [facility] & will call us back once discharged & updated.

## 2015-10-11 NOTE — Discharge Instructions (Signed)

## 2015-10-11 NOTE — Progress Notes (Addendum)
SSE by Dr. Ihor Dow, large amount of clots noted.  Monsel's & Arista applied to cervix by MD.  Perineum cleaned, new pad applied.

## 2015-10-11 NOTE — Telephone Encounter (Signed)
Pt in ER and cxl appt for 1130am today-pt requesting call from clinic

## 2015-10-11 NOTE — MAU Provider Note (Signed)
History     CSN: [id number]  Arrival date and time: 10/11/15 1007   None     Chief Complaint  Patient presents with  . Vaginal Bleeding   HPI   Ms.[name] is a 65 y.o. female [phone] with multiple health problems including a total hysterectomy > 20 years ago here today with heavy vaginal bleeding. Patient is on daily coumadin due to aortic valve replacement.   She was seen here in the MAU yesterday with the same complaint and saw Dr. Elly Modena.   Last night around 11:00 pm she started having heavy vaginal bleeding with large blood clots. She soaked around 6 pads throughout the night. She denies dizziness at this time.   This has been an ongoing problem for the patient. On 4/20 she saw Dr. Roselie Awkward in the [facility] who at the time performed a cervical biopsy (benign); the bleeding attested following silver nitrate and monsel's application. Yesterday the bleeding again was heavy at the time of visit in the MAU and her vagina was packed with gauze which again attested her bleeding.   Patient is being seen at the [facility] @ 0900 tomorrow 5/19 for follow up, however came here today due to concerns of the heavy bleeding and passing of multiple clots.   Denies pain at this time. Denies the need for pain medicine at this time.     OB History    Gravida Para Term Preterm AB TAB SAB Ectopic Multiple Living   2 2 2       2       Past Medical History  Diagnosis Date  . Anemia   . Anxiety   . Depression   . Diverticulosis   . Elevated cholesterol   . Obesity   . THYROMEGALY 07/22/2010  . Overweight(278.02) 03/11/2010  . Mixed hyperlipidemia 03/11/2010  . MEASLES, HX OF 03/11/2010  . KNEE PAIN, RIGHT 05/28/2010  . HYPERTRIGLYCERIDEMIA 03/11/2010  . HEART MURMUR, HX OF 03/11/2010  . FIBROIDS, UTERUS 03/11/2010  . Diarrhea 08/05/2010  . CONSTIPATION 03/11/2010  . CHICKENPOX, HX OF 03/11/2010  . BIPOLAR AFFECTIVE DISORDER 03/11/2010  . AORTIC VALVE REPLACEMENT, HX OF 03/11/2010  . ANEMIA  05/28/2010  . Acute bronchitis 07/22/2010  . Abdominal pain, generalized 03/19/2010  . Peripheral edema 11/18/2010  . Bacterial vaginitis 11/18/2010  . Hematuria 12/18/2010  . Diverticulitis 12/18/2010  . Hyperthyroidism 12/18/2010  . Fatigue 12/30/2010  . Renal insufficiency 12/30/2010  . Grave's disease 8-12  . Arm lesion 03/23/2011  . UTI (lower urinary tract infection) 08/14/2011  . Breast pain in female 11/17/2011  . Chest pain 11/17/2011  . Hematoma 12/18/2011  . Cervical cancer screening 03/22/2012  . Foot pain, left 03/22/2012  . Foot fracture, left 03/22/2012  . Tick bite of back 10/15/2012  . Preop examination 02/03/2013  . Dysrhythmia   . Hypertension   . Shortness of breath     with exertion   . Heart murmur   . Urinary tract bacterial infections   . Arthritis     Past Surgical History  Procedure Laterality Date  . Appendectomy      Required revision for EColi infection, required recurrent packing  . Bowel obstruction      Requiring adhesions to be removed  . Aortic valve replacement    . Open heart with a cardiac aneurysm repair    . Cholecystectomy    . Tubal ligation    . Varicose vein surgery b/l legs    . Childhood exploratory surgery  of heart    . Hernia repair  08-16-10  . Abdominal hysterectomy  ?1998    sb/l spo, total for fibroids/heavy bleeding  . Total knee arthroplasty Right 02/24/2013    Procedure: RIGHT TOTAL KNEE ARTHROPLASTY;  Surgeon: Johnn Hai, MD;  Location: [facility];  Service: Orthopedics;  Laterality: Right;  . Left heart catheterization with coronary angiogram N/A 12/05/2011    Procedure: LEFT HEART CATHETERIZATION WITH CORONARY ANGIOGRAM;  Surgeon: Sinclair Grooms, MD;  Location: [facility] CATH LAB;  Service: Cardiovascular;  Laterality: N/A;    Family History  Problem Relation Age of Onset  . Scoliosis Mother   . Arthritis Mother     Rheumatoid  . Aneurysm Mother     heart  . Alcohol abuse Father   . Depression Sister   . Depression Brother    . Alcohol abuse Brother   . Cancer Maternal Grandmother     colon  . Diabetes Maternal Grandfather   . Heart disease Maternal Grandfather   . Alcohol abuse Maternal Grandfather   . Depression Paternal Grandmother   . Diabetes Paternal Grandmother   . Depression Paternal Grandfather   . Diabetes Paternal Grandfather   . Depression Sister   . Alcohol abuse Brother   . Depression Brother   . Heart disease Brother     Social History  Substance Use Topics  . Smoking status: Former Smoker    Quit date: 05/26/1990  . Smokeless tobacco: Never Used  . Alcohol Use: 0.0 oz/week    0 Standard drinks or equivalent per week     Comment: rare    Allergies:  Allergies  Allergen Reactions  . Sulfonamide Derivatives     headaches    Prescriptions prior to admission  Medication Sig Dispense Refill Last Dose  . albuterol (PROVENTIL HFA;VENTOLIN HFA) 108 (90 BASE) MCG/ACT inhaler Inhale 2 puffs into the lungs every 6 (six) hours as needed for wheezing or shortness of breath. 1 Inhaler 0 rescue  . clonazePAM (KLONOPIN) 0.5 MG tablet Take 0.5 mg by mouth 2 (two) times daily.    10/09/2015 at Unknown time  . furosemide (LASIX) 40 MG tablet Take 1 tablet (40 mg total) by mouth daily. 30 tablet 11 10/09/2015 at Unknown time  . glucose blood test strip Use once daily to check blood sugar.  DX E11.9 100 each 3 Taking  . lamoTRIgine (LAMICTAL) 200 MG tablet Take 200 mg by mouth at bedtime.   10/09/2015 at Unknown time  . metoprolol succinate (TOPROL-XL) 25 MG 24 hr tablet TAKE 1 TABLET (25 MG TOTAL) BY MOUTH EVERY MORNING. 30 tablet 4 10/09/2015 at 1000  . ONE TOUCH LANCETS MISC Test once daily to check blood sugar. DX E11.9 100 each 3 Taking  . pantoprazole (PROTONIX) 40 MG tablet TAKE 1 TABLET (40 MG TOTAL) BY MOUTH DAILY. 30 tablet 5 10/09/2015 at Unknown time  . Propylene Glycol (SYSTANE BALANCE) 0.6 % SOLN Place 1 drop into both eyes at bedtime as needed (dry eyes).    10/09/2015 at Unknown time  .  rosuvastatin (CRESTOR) 40 MG tablet TAKE 1 TABLET (40 MG TOTAL) BY MOUTH DAILY. 30 tablet 6 10/09/2015 at Unknown time  . SEROQUEL XR 400 MG 24 hr tablet Take 400 mg by mouth at bedtime.   1 10/09/2015 at Unknown time  . warfarin (COUMADIN) 5 MG tablet TAKE AS DIRECTED BY ANTICOAGULATION CLINIC (Patient taking differently: Take 2.5mg  on Tuesday and Saturday, all other days take 5mg .) 35 tablet 2  10/09/2015 at Unknown time   Results for orders placed or performed during the hospital encounter of 10/11/15 (from the past 48 hour(s))  CBC     Status: Abnormal   Collection Time: 10/11/15 10:57 AM  Result Value Ref Range   WBC 6.7 4.0 - 10.5 K/uL   RBC 4.03 3.87 - 5.11 MIL/uL   Hemoglobin 12.0 12.0 - 15.0 g/dL   HCT 35.8 (L) 36.0 - 46.0 %   MCV 88.8 78.0 - 100.0 fL   MCH 29.8 26.0 - 34.0 pg   MCHC 33.5 30.0 - 36.0 g/dL   RDW 14.4 11.5 - 15.5 %   Platelets 200 150 - 400 K/uL   Review of Systems  Constitutional: Positive for malaise/fatigue.  Neurological: Negative for dizziness.   Physical Exam   Blood pressure 135/70, pulse 78, temperature 97.9 F (36.6 C), temperature source Oral, resp. rate 18.  Physical Exam  Constitutional: She is oriented to person, place, and time. She appears well-developed and well-nourished. No distress.  HENT:  Head: Normocephalic.  Respiratory: Effort normal.  Genitourinary: There is tenderness and bleeding in the vagina. There are signs of injury in the vagina.   Perineum with bright red, dried blood throughout. With speculum insertion, a 1 cm area of excoriation was noted in the posterior vagina, along the posterior vaginal wall @ 11 o' clock. Steady stream of bright red vaginal bleeding noted. Monsel's solution applied to area with pressure held for 5 minutes.   Musculoskeletal: Normal range of motion.  Neurological: She is alert and oriented to person, place, and time.  Skin: Skin is warm. She is not diaphoretic.  Psychiatric: Her behavior is normal.     MAU Course  Procedures  None  MDM  CBC- stable hgb Discussed patient with Dr. Ihor Dow.  Dr. Ihor Dow to MAU to further evaluate the patient.   Assessment and Plan    Lezlie Lye, NP  10/11/2015 3:11 PM

## 2015-10-12 ENCOUNTER — Encounter: Payer: Self-pay | Admitting: Obstetrics & Gynecology

## 2015-10-12 ENCOUNTER — Other Ambulatory Visit: Payer: Self-pay | Admitting: Family Medicine

## 2015-10-12 ENCOUNTER — Ambulatory Visit (INDEPENDENT_AMBULATORY_CARE_PROVIDER_SITE_OTHER): Payer: BLUE CROSS/BLUE SHIELD | Admitting: Obstetrics & Gynecology

## 2015-10-12 VITALS — BP 154/63 | HR 66 | Wt 259.3 lb

## 2015-10-12 DIAGNOSIS — N939 Abnormal uterine and vaginal bleeding, unspecified: Secondary | ICD-10-CM | POA: Diagnosis not present

## 2015-10-12 MED ORDER — ESTRADIOL 10 MCG VA TABS: ORAL_TABLET | VAGINAL | Status: DC

## 2015-10-12 MED ORDER — METOPROLOL SUCCINATE ER 25 MG PO TB24: ORAL_TABLET | ORAL | Status: DC

## 2015-10-12 NOTE — Progress Notes (Signed)
Patient ID: [name], female   DOB: March 18, 1951, 65 y.o.   MRN: [id number]  Chief Complaint  Patient presents with  . Menorrhagia    HPI [name] is a 65 y.o. female.  [id number] No LMP recorded. Patient has had a hysterectomy. She has returned to MAU three times this month for recurrent bleeding and had monsel's solution applied and vaginal pack. Only spotting today, pack fell out  HPI  Past Medical History  Diagnosis Date  . Anemia   . Anxiety   . Depression   . Diverticulosis   . Elevated cholesterol   . Obesity   . THYROMEGALY 07/22/2010  . Overweight(278.02) 03/11/2010  . Mixed hyperlipidemia 03/11/2010  . MEASLES, HX OF 03/11/2010  . KNEE PAIN, RIGHT 05/28/2010  . HYPERTRIGLYCERIDEMIA 03/11/2010  . HEART MURMUR, HX OF 03/11/2010  . FIBROIDS, UTERUS 03/11/2010  . Diarrhea 08/05/2010  . CONSTIPATION 03/11/2010  . CHICKENPOX, HX OF 03/11/2010  . BIPOLAR AFFECTIVE DISORDER 03/11/2010  . AORTIC VALVE REPLACEMENT, HX OF 03/11/2010  . ANEMIA 05/28/2010  . Acute bronchitis 07/22/2010  . Abdominal pain, generalized 03/19/2010  . Peripheral edema 11/18/2010  . Bacterial vaginitis 11/18/2010  . Hematuria 12/18/2010  . Diverticulitis 12/18/2010  . Hyperthyroidism 12/18/2010  . Fatigue 12/30/2010  . Renal insufficiency 12/30/2010  . Grave's disease 8-12  . Arm lesion 03/23/2011  . UTI (lower urinary tract infection) 08/14/2011  . Breast pain in female 11/17/2011  . Chest pain 11/17/2011  . Hematoma 12/18/2011  . Cervical cancer screening 03/22/2012  . Foot pain, left 03/22/2012  . Foot fracture, left 03/22/2012  . Tick bite of back 10/15/2012  . Preop examination 02/03/2013  . Dysrhythmia   . Hypertension   . Shortness of breath     with exertion   . Heart murmur   . Urinary tract bacterial infections   . Arthritis     Past Surgical History  Procedure Laterality Date  . Appendectomy      Required revision for EColi infection, required recurrent packing  . Bowel obstruction       Requiring adhesions to be removed  . Aortic valve replacement    . Open heart with a cardiac aneurysm repair    . Cholecystectomy    . Tubal ligation    . Varicose vein surgery b/l legs    . Childhood exploratory surgery of heart    . Hernia repair  08-16-10  . Abdominal hysterectomy  ?1998    sb/l spo, total for fibroids/heavy bleeding  . Total knee arthroplasty Right 02/24/2013    Procedure: RIGHT TOTAL KNEE ARTHROPLASTY;  Surgeon: Johnn Hai, MD;  Location: [facility];  Service: Orthopedics;  Laterality: Right;  . Left heart catheterization with coronary angiogram N/A 12/05/2011    Procedure: LEFT HEART CATHETERIZATION WITH CORONARY ANGIOGRAM;  Surgeon: Sinclair Grooms, MD;  Location: [facility] CATH LAB;  Service: Cardiovascular;  Laterality: N/A;    Family History  Problem Relation Age of Onset  . Scoliosis Mother   . Arthritis Mother     Rheumatoid  . Aneurysm Mother     heart  . Alcohol abuse Father   . Depression Sister   . Depression Brother   . Alcohol abuse Brother   . Cancer Maternal Grandmother     colon  . Diabetes Maternal Grandfather   . Heart disease Maternal Grandfather   . Alcohol abuse Maternal Grandfather   . Depression Paternal Grandmother   . Diabetes Paternal Grandmother   .  Depression Paternal Grandfather   . Diabetes Paternal Grandfather   . Depression Sister   . Alcohol abuse Brother   . Depression Brother   . Heart disease Brother     Social History Social History  Substance Use Topics  . Smoking status: Former Smoker    Quit date: 05/26/1990  . Smokeless tobacco: Never Used  . Alcohol Use: 0.0 oz/week    0 Standard drinks or equivalent per week     Comment: rare    Allergies  Allergen Reactions  . Sulfonamide Derivatives     headaches    Current Outpatient Prescriptions  Medication Sig Dispense Refill  . albuterol (PROVENTIL HFA;VENTOLIN HFA) 108 (90 BASE) MCG/ACT inhaler Inhale 2 puffs into the lungs every 6 (six) hours as needed  for wheezing or shortness of breath. 1 Inhaler 0  . aspirin EC 81 MG tablet Take 81 mg by mouth daily.    . clonazePAM (KLONOPIN) 0.5 MG tablet Take 0.5 mg by mouth 2 (two) times daily.     . furosemide (LASIX) 40 MG tablet Take 1 tablet (40 mg total) by mouth daily. 30 tablet 11  . glucose blood test strip Use once daily to check blood sugar.  DX E11.9 100 each 3  . lamoTRIgine (LAMICTAL) 200 MG tablet Take 200 mg by mouth at bedtime.    . metoprolol succinate (TOPROL-XL) 25 MG 24 hr tablet TAKE 1 TABLET (25 MG TOTAL) BY MOUTH EVERY MORNING. 30 tablet 4  . ONE TOUCH LANCETS MISC Test once daily to check blood sugar. DX E11.9 100 each 3  . pantoprazole (PROTONIX) 40 MG tablet TAKE 1 TABLET (40 MG TOTAL) BY MOUTH DAILY. 30 tablet 5  . Propylene Glycol (SYSTANE BALANCE) 0.6 % SOLN Place 1 drop into both eyes at bedtime as needed (dry eyes).     . rosuvastatin (CRESTOR) 40 MG tablet TAKE 1 TABLET (40 MG TOTAL) BY MOUTH DAILY. 30 tablet 6  . SEROQUEL XR 400 MG 24 hr tablet Take 400 mg by mouth at bedtime.   1  . warfarin (COUMADIN) 5 MG tablet TAKE AS DIRECTED BY ANTICOAGULATION CLINIC (Patient taking differently: Take 2.5mg  on Tuesday and Saturday, all other days take 5mg .) 35 tablet 2  . Estradiol 10 MCG TABS vaginal tablet One tablet in vagina daily for 2 weeks then 3 times a week for total of 8 weeks 30 tablet 2  . [DISCONTINUED] methimazole (TAPAZOLE) 5 MG tablet Take 1 tablet (5 mg total) by mouth 3 (three) times daily. 30 tablet 2   No current facility-administered medications for this visit.    Review of Systems Review of Systems  Constitutional: Negative.   Respiratory: Negative.   Genitourinary: Positive for vaginal bleeding. Negative for vaginal discharge, vaginal pain and menstrual problem.    Blood pressure 154/63, pulse 66, weight 259 lb 4.8 oz (117.618 kg).  Physical Exam Physical Exam  Constitutional: She is oriented to person, place, and time. She appears well-developed. No  distress.  obese  Pulmonary/Chest: Effort normal.  Abdominal: She exhibits no distension.  Genitourinary:  Deferred, pad has scant spotting  Neurological: She is alert and oriented to person, place, and time.  Skin: No pallor.  Psychiatric: She has a normal mood and affect.    Data Reviewed CBC    Component Value Date/Time   WBC 6.7 10/11/2015 1057   WBC 4.4 06/16/2011 0953   RBC 4.03 10/11/2015 1057   RBC 4.11 06/16/2011 0953   RBC 3.35* 02/20/2011 0820  HGB 12.0 10/11/2015 1057   HGB 12.0 06/16/2011 0953   HCT 35.8* 10/11/2015 1057   HCT 35.6 06/16/2011 0953   PLT 200 10/11/2015 1057   PLT 233 06/16/2011 0953   MCV 88.8 10/11/2015 1057   MCV 86.6 06/16/2011 0953   MCH 29.8 10/11/2015 1057   MCH 29.2 06/16/2011 0953   MCHC 33.5 10/11/2015 1057   MCHC 33.7 06/16/2011 0953   RDW 14.4 10/11/2015 1057   RDW 14.1 06/16/2011 0953   LYMPHSABS 2.5 09/11/2015 2228   LYMPHSABS 1.7 06/16/2011 0953   MONOABS 0.6 09/11/2015 2228   MONOABS 0.3 06/16/2011 0953   EOSABS 0.2 09/11/2015 2228   EOSABS 0.2 06/16/2011 0953   BASOSABS 0.1 09/11/2015 2228   BASOSABS 0.1 06/16/2011 0953      Assessment    Vaginal bleeding post TAH, likely result of coumadin therapy Normal biopsy   Suspect postmenopausal vaginal atrophy  Plan    Vagifem for total 8 weeks RTC 6 weeks    Notify if bleeding resumes Nothng in vagina   ARNOLD,JAMES 10/12/2015, 10:05 AM

## 2015-10-12 NOTE — Patient Instructions (Signed)
Hormone Therapy At menopause, your body begins making less estrogen and progesterone hormones. This causes the body to stop having menstrual periods. This is because estrogen and progesterone hormones control your periods and menstrual cycle. A lack of estrogen may cause symptoms such as:  Hot flushes (or hot flashes).  Vaginal dryness.  Dry skin.  Loss of sex drive.  Risk of bone loss (osteoporosis). When this happens, you may choose to take hormone therapy to get back the estrogen lost during menopause. When the hormone estrogen is given alone, it is usually referred to as ET (Estrogen Therapy). When the hormone progestin is combined with estrogen, it is generally called HT (Hormone Therapy). This was formerly known as hormone replacement therapy (HRT). Your caregiver can help you make a decision on what will be best for you. The decision to use HT seems to change often as new studies are done. Many studies do not agree on the benefits of hormone replacement therapy. LIKELY BENEFITS OF HT INCLUDE PROTECTION FROM:  Hot Flushes (also called hot flashes) - A hot flush is a sudden feeling of heat that spreads over the face and body. The skin may redden like a blush. It is connected with sweats and sleep disturbance. Women going through menopause may have hot flushes a few times a month or several times per day depending on the woman.  Osteoporosis (bone loss) - Estrogen helps guard against bone loss. After menopause, a woman's bones slowly lose calcium and become weak and brittle. As a result, bones are more likely to break. The hip, wrist, and spine are affected most often. Hormone therapy can help slow bone loss after menopause. Weight bearing exercise and taking calcium with vitamin D also can help prevent bone loss. There are also medications that your caregiver can prescribe that can help prevent osteoporosis.  Vaginal dryness - Loss of estrogen causes changes in the vagina. Its lining may  become thin and dry. These changes can cause pain and bleeding during sexual intercourse. Dryness can also lead to infections. This can cause burning and itching. (Vaginal estrogen treatment can help relieve pain, itching, and dryness.)  Urinary tract infections are more common after menopause because of lack of estrogen. Some women also develop urinary incontinence because of low estrogen levels in the vagina and bladder.  Possible other benefits of estrogen include a positive effect on mood and short-term memory in women. RISKS AND COMPLICATIONS  Using estrogen alone without progesterone causes the lining of the uterus to grow. This increases the risk of lining of the uterus (endometrial) cancer. Your caregiver should give another hormone called progestin if you have a uterus.  Women who take combined (estrogen and progestin) HT appear to have an increased risk of breast cancer. The risk appears to be small, but increases throughout the time that HT is taken.  Combined therapy also makes the breast tissue slightly denser which makes it harder to read mammograms (breast X-rays).  Combined, estrogen and progesterone therapy can be taken together every day, in which case there may be spotting of blood. HT therapy can be taken cyclically in which case you will have menstrual periods. Cyclically means HT is taken for a set amount of days, then not taken, then this process is repeated.  HT may increase the risk of stroke, heart attack, breast cancer and forming blood clots in your leg.  Transdermal estrogen (estrogen that is absorbed through the skin with a patch or a cream) may have better results with:  Cholesterol.  Blood pressure.  Blood clots. Having the following conditions may indicate you should not have HT:  Endometrial cancer.  Liver disease.  Breast cancer.  Heart disease.  History of blood clots.  Stroke. TREATMENT   If you choose to take HT and have a uterus, usually  estrogen and progestin are prescribed.  Your caregiver will help you decide the best way to take the medications.  Possible ways to take estrogen include:  Pills.  Patches.  Gels.  Sprays.  Vaginal estrogen cream, rings and tablets.  It is best to take the lowest dose possible that will help your symptoms and take them for the shortest period of time that you can.  Hormone therapy can help relieve some of the problems (symptoms) that affect women at menopause. Before making a decision about HT, talk to your caregiver about what is best for you. Be well informed and comfortable with your decisions. HOME CARE INSTRUCTIONS   Follow your caregivers advice when taking the medications.  A Pap test is done to screen for cervical cancer.  The first Pap test should be done at age 34.  Between ages 80 and 52, Pap tests are repeated every 2 years.  Beginning at age 13, you are advised to have a Pap test every 3 years as long as the past 3 Pap tests have been normal.  Some women have medical problems that increase the chance of getting cervical cancer. Talk to your caregiver about these problems. It is especially important to talk to your caregiver if a new problem develops soon after your last Pap test. In these cases, your caregiver may recommend more frequent screening and Pap tests.  The above recommendations are the same for women who have or have not gotten the vaccine for HPV (human papillomavirus).  If you had a hysterectomy for a problem that was not a cancer or a condition that could lead to cancer, then you no longer need Pap tests. However, even if you no longer need a Pap test, a regular exam is a good idea to make sure no other problems are starting.  If you are between ages 20 and 60, and you have had normal Pap tests going back 10 years, you no longer need Pap tests. However, even if you no longer need a Pap test, a regular exam is a good idea to make sure no other problems  are starting.  If you have had past treatment for cervical cancer or a condition that could lead to cancer, you need Pap tests and screening for cancer for at least 20 years after your treatment.  If Pap tests have been discontinued, risk factors (such as a new sexual partner)need to be re-assessed to determine if screening should be resumed.  Some women may need screenings more often if they are at high risk for cervical cancer.  Get mammograms done as per the advice of your caregiver. SEEK IMMEDIATE MEDICAL CARE IF:  You develop abnormal vaginal bleeding.  You have pain or swelling in your legs, shortness of breath, or chest pain.  You develop dizziness or headaches.  You have lumps or changes in your breasts or armpits.  You have slurred speech.  You develop weakness or numbness of your arms or legs.  You have pain, burning, or bleeding when urinating.  You develop abdominal pain.   This information is not intended to replace advice given to you by your health care provider. Make sure you discuss any questions  you have with your health care provider.   Document Released: 02/08/2003 Document Revised: 09/26/2014 Document Reviewed: 11/13/2014 Elsevier Interactive Patient Education 2016 [employer]  

## 2015-10-13 ENCOUNTER — Inpatient Hospital Stay (HOSPITAL_COMMUNITY)
Admission: AD | Admit: 2015-10-13 | Discharge: 2015-10-13 | Disposition: A | Discharge status: Home / Self Care | Payer: BLUE CROSS/BLUE SHIELD | Source: Ambulatory Visit | Attending: Obstetrics & Gynecology | Admitting: Obstetrics & Gynecology

## 2015-10-13 ENCOUNTER — Encounter (HOSPITAL_COMMUNITY): Payer: Self-pay | Admitting: Certified Nurse Midwife

## 2015-10-13 DIAGNOSIS — Z952 Presence of prosthetic heart valve: Secondary | ICD-10-CM | POA: Insufficient documentation

## 2015-10-13 DIAGNOSIS — Z87891 Personal history of nicotine dependence: Secondary | ICD-10-CM | POA: Diagnosis not present

## 2015-10-13 DIAGNOSIS — Z7982 Long term (current) use of aspirin: Secondary | ICD-10-CM | POA: Insufficient documentation

## 2015-10-13 DIAGNOSIS — N939 Abnormal uterine and vaginal bleeding, unspecified: Secondary | ICD-10-CM | POA: Diagnosis not present

## 2015-10-13 DIAGNOSIS — I10 Essential (primary) hypertension: Secondary | ICD-10-CM | POA: Insufficient documentation

## 2015-10-13 DIAGNOSIS — Z7901 Long term (current) use of anticoagulants: Secondary | ICD-10-CM | POA: Diagnosis not present

## 2015-10-13 MED ORDER — GELATIN ABSORBABLE 12-7 MM EX MISC
1.0000 | Freq: Once | CUTANEOUS | Status: DC
  Filled 2015-10-13: qty 1

## 2015-10-13 MED ORDER — FERRIC SUBSULFATE 259 MG/GM EX SOLN
CUTANEOUS | Status: AC
  Filled 2015-10-13: qty 8

## 2015-10-13 NOTE — MAU Note (Signed)
Pt presents with vaginal bleeding that pt describes as a heavy period. Pt states this is the 3rd time in a week that she has presented to MAU for the same complaint.

## 2015-10-13 NOTE — Discharge Instructions (Signed)

## 2015-10-13 NOTE — MAU Provider Note (Signed)
History     CSN: [id number]  Arrival date and time: 10/13/15 1317   First Provider Initiated Contact with Patient 10/13/15 1326      Chief Complaint  Patient presents with  . Vaginal Bleeding   HPI   [name] is a 65 y.o. female [phone] with multiple health problems including a total hysterectomy > 20 years ago here today with heavy vaginal bleeding. Patient is on daily coumadin due to aortic valve replacement.   This the patients 3rd visit to the MAU for this same complaint.   She was seen 3 days ago (5/18) and had Monsel's applied to the area along with Arista powder. It worked well up until this morning when the patient noted bright red vaginal blood in her pad.  She denies dizziness at this time.   This has been an ongoing problem for the patient. On 4/20 she saw Dr. Roselie Awkward in the [facility] who at the time performed a cervical biopsy (benign); the bleeding attested following silver nitrate and monsel's application. On 5/17  the bleeding again was heavy at the time of visit in the MAU and her vagina was packed with gauze which again attested her bleeding.   Denies pain at this time. Denies the need for pain medicine at this time.    OB History    Gravida Para Term Preterm AB TAB SAB Ectopic Multiple Living   2 2 2       2       Past Medical History  Diagnosis Date  . Anemia   . Anxiety   . Depression   . Diverticulosis   . Elevated cholesterol   . Obesity   . THYROMEGALY 07/22/2010  . Overweight(278.02) 03/11/2010  . Mixed hyperlipidemia 03/11/2010  . MEASLES, HX OF 03/11/2010  . KNEE PAIN, RIGHT 05/28/2010  . HYPERTRIGLYCERIDEMIA 03/11/2010  . HEART MURMUR, HX OF 03/11/2010  . FIBROIDS, UTERUS 03/11/2010  . Diarrhea 08/05/2010  . CONSTIPATION 03/11/2010  . CHICKENPOX, HX OF 03/11/2010  . BIPOLAR AFFECTIVE DISORDER 03/11/2010  . AORTIC VALVE REPLACEMENT, HX OF 03/11/2010  . ANEMIA 05/28/2010  . Acute bronchitis 07/22/2010  . Abdominal pain, generalized  03/19/2010  . Peripheral edema 11/18/2010  . Bacterial vaginitis 11/18/2010  . Hematuria 12/18/2010  . Diverticulitis 12/18/2010  . Hyperthyroidism 12/18/2010  . Fatigue 12/30/2010  . Renal insufficiency 12/30/2010  . Grave's disease 8-12  . Arm lesion 03/23/2011  . UTI (lower urinary tract infection) 08/14/2011  . Breast pain in female 11/17/2011  . Chest pain 11/17/2011  . Hematoma 12/18/2011  . Cervical cancer screening 03/22/2012  . Foot pain, left 03/22/2012  . Foot fracture, left 03/22/2012  . Tick bite of back 10/15/2012  . Preop examination 02/03/2013  . Dysrhythmia   . Hypertension   . Shortness of breath     with exertion   . Heart murmur   . Urinary tract bacterial infections   . Arthritis     Past Surgical History  Procedure Laterality Date  . Appendectomy      Required revision for EColi infection, required recurrent packing  . Bowel obstruction      Requiring adhesions to be removed  . Aortic valve replacement    . Open heart with a cardiac aneurysm repair    . Cholecystectomy    . Tubal ligation    . Varicose vein surgery b/l legs    . Childhood exploratory surgery of heart    . Hernia repair  08-16-10  . Abdominal  hysterectomy  ?1998    sb/l spo, total for fibroids/heavy bleeding  . Total knee arthroplasty Right 02/24/2013    Procedure: RIGHT TOTAL KNEE ARTHROPLASTY;  Surgeon: Johnn Hai, MD;  Location: [facility];  Service: Orthopedics;  Laterality: Right;  . Left heart catheterization with coronary angiogram N/A 12/05/2011    Procedure: LEFT HEART CATHETERIZATION WITH CORONARY ANGIOGRAM;  Surgeon: Sinclair Grooms, MD;  Location: [facility] CATH LAB;  Service: Cardiovascular;  Laterality: N/A;    Family History  Problem Relation Age of Onset  . Scoliosis Mother   . Arthritis Mother     Rheumatoid  . Aneurysm Mother     heart  . Alcohol abuse Father   . Depression Sister   . Depression Brother   . Alcohol abuse Brother   . Cancer Maternal Grandmother     colon   . Diabetes Maternal Grandfather   . Heart disease Maternal Grandfather   . Alcohol abuse Maternal Grandfather   . Depression Paternal Grandmother   . Diabetes Paternal Grandmother   . Depression Paternal Grandfather   . Diabetes Paternal Grandfather   . Depression Sister   . Alcohol abuse Brother   . Depression Brother   . Heart disease Brother     Social History  Substance Use Topics  . Smoking status: Former Smoker    Quit date: 05/26/1990  . Smokeless tobacco: Never Used  . Alcohol Use: 0.0 oz/week    0 Standard drinks or equivalent per week     Comment: rare    Allergies:  Allergies  Allergen Reactions  . Sulfonamide Derivatives     headaches    Prescriptions prior to admission  Medication Sig Dispense Refill Last Dose  . aspirin EC 81 MG tablet Take 81 mg by mouth daily.   10/12/2015 at Unknown time  . clonazePAM (KLONOPIN) 0.5 MG tablet Take 0.5 mg by mouth 2 (two) times daily.    10/13/2015 at Unknown time  . Estradiol 10 MCG TABS vaginal tablet One tablet in vagina daily for 2 weeks then 3 times a week for total of 8 weeks 30 tablet 2 10/12/2015 at Unknown time  . furosemide (LASIX) 40 MG tablet Take 1 tablet (40 mg total) by mouth daily. 30 tablet 11 10/12/2015 at Unknown time  . glucose blood test strip Use once daily to check blood sugar.  DX E11.9 100 each 3 10/12/2015 at Unknown time  . lamoTRIgine (LAMICTAL) 200 MG tablet Take 200 mg by mouth at bedtime.   10/12/2015 at Unknown time  . metoprolol succinate (TOPROL-XL) 25 MG 24 hr tablet TAKE 1 TABLET (25 MG TOTAL) BY MOUTH EVERY MORNING. 30 tablet 4 10/13/2015 at 9:00am  . ONE TOUCH LANCETS MISC Test once daily to check blood sugar. DX E11.9 100 each 3 10/12/2015 at Unknown time  . pantoprazole (PROTONIX) 40 MG tablet TAKE 1 TABLET (40 MG TOTAL) BY MOUTH DAILY. 30 tablet 5 10/12/2015 at Unknown time  . rosuvastatin (CRESTOR) 40 MG tablet TAKE 1 TABLET (40 MG TOTAL) BY MOUTH DAILY. 30 tablet 6 10/12/2015 at Unknown time   . SEROQUEL XR 400 MG 24 hr tablet Take 400 mg by mouth at bedtime.   1 10/12/2015 at Unknown time  . warfarin (COUMADIN) 5 MG tablet TAKE AS DIRECTED BY ANTICOAGULATION CLINIC (Patient taking differently: Take 2.5mg  on Tuesday and Saturday, all other days take 5mg .) 35 tablet 2 10/12/2015 at Unknown time  . albuterol (PROVENTIL HFA;VENTOLIN HFA) 108 (90 BASE) MCG/ACT inhaler Inhale  2 puffs into the lungs every 6 (six) hours as needed for wheezing or shortness of breath. 1 Inhaler 0 prn   No results found for this or any previous visit (from the past 48 hour(s)).  Review of Systems  Constitutional: Positive for malaise/fatigue. Negative for fever and chills.  Gastrointestinal: Negative for abdominal pain.  Neurological: Negative for dizziness.   Physical Exam   Blood pressure 156/67, pulse 69, temperature 97.9 F (36.6 C), temperature source Oral, resp. rate 18.  Physical Exam  Constitutional: She is oriented to person, place, and time. She appears well-developed and well-nourished. No distress.  HENT:  Head: Normocephalic.  Eyes: Pupils are equal, round, and reactive to light.  Genitourinary:  Perineum with bright red, dried blood throughout. With speculum insertion, a 1 cm area of excoriation was noted in the posterior vagina, along the posterior vaginal wall @ 11 o' clock. Clot noted with a scant/ steady stream of bright red vaginal bleeding noted beneath clot.   Musculoskeletal: Normal range of motion.  Neurological: She is alert and oriented to person, place, and time.  Skin: She is not diaphoretic. No pallor.  Psychiatric: Her behavior is normal.    MAU Course  Procedures    MDM 1400: Dr. Harolyn Rutherford at bedside for further evaluation, discussion of treatment plan  Lezlie Lye, NP 10/13/2015 7:28 PM   Assessment and Plan

## 2015-10-16 ENCOUNTER — Encounter (HOSPITAL_COMMUNITY): Payer: Self-pay | Admitting: Anesthesiology

## 2015-10-16 ENCOUNTER — Ambulatory Visit (HOSPITAL_COMMUNITY)
Admission: AD | Admit: 2015-10-16 | Discharge: 2015-10-17 | Disposition: A | Discharge status: Home / Self Care | Payer: BLUE CROSS/BLUE SHIELD | Source: Ambulatory Visit | Attending: Family Medicine | Admitting: Family Medicine

## 2015-10-16 ENCOUNTER — Ambulatory Visit (HOSPITAL_COMMUNITY): Payer: BLUE CROSS/BLUE SHIELD

## 2015-10-16 ENCOUNTER — Inpatient Hospital Stay (HOSPITAL_COMMUNITY): Payer: BLUE CROSS/BLUE SHIELD | Admitting: Anesthesiology

## 2015-10-16 ENCOUNTER — Encounter (HOSPITAL_COMMUNITY)
Admission: AD | Disposition: A | Discharge status: Home / Self Care | Payer: Self-pay | Source: Ambulatory Visit | Attending: Family Medicine

## 2015-10-16 ENCOUNTER — Encounter (HOSPITAL_COMMUNITY): Payer: Self-pay | Admitting: *Deleted

## 2015-10-16 DIAGNOSIS — Z952 Presence of prosthetic heart valve: Secondary | ICD-10-CM | POA: Diagnosis not present

## 2015-10-16 DIAGNOSIS — K429 Umbilical hernia without obstruction or gangrene: Secondary | ICD-10-CM | POA: Diagnosis not present

## 2015-10-16 DIAGNOSIS — Z7982 Long term (current) use of aspirin: Secondary | ICD-10-CM | POA: Insufficient documentation

## 2015-10-16 DIAGNOSIS — E669 Obesity, unspecified: Secondary | ICD-10-CM | POA: Diagnosis not present

## 2015-10-16 DIAGNOSIS — D62 Acute posthemorrhagic anemia: Secondary | ICD-10-CM | POA: Diagnosis present

## 2015-10-16 DIAGNOSIS — F319 Bipolar disorder, unspecified: Secondary | ICD-10-CM | POA: Diagnosis not present

## 2015-10-16 DIAGNOSIS — F419 Anxiety disorder, unspecified: Secondary | ICD-10-CM | POA: Diagnosis not present

## 2015-10-16 DIAGNOSIS — Z882 Allergy status to sulfonamides status: Secondary | ICD-10-CM | POA: Diagnosis not present

## 2015-10-16 DIAGNOSIS — M1990 Unspecified osteoarthritis, unspecified site: Secondary | ICD-10-CM | POA: Diagnosis not present

## 2015-10-16 DIAGNOSIS — E782 Mixed hyperlipidemia: Secondary | ICD-10-CM | POA: Insufficient documentation

## 2015-10-16 DIAGNOSIS — Z9049 Acquired absence of other specified parts of digestive tract: Secondary | ICD-10-CM | POA: Insufficient documentation

## 2015-10-16 DIAGNOSIS — R0602 Shortness of breath: Secondary | ICD-10-CM | POA: Insufficient documentation

## 2015-10-16 DIAGNOSIS — I10 Essential (primary) hypertension: Secondary | ICD-10-CM | POA: Insufficient documentation

## 2015-10-16 DIAGNOSIS — N898 Other specified noninflammatory disorders of vagina: Secondary | ICD-10-CM | POA: Diagnosis present

## 2015-10-16 DIAGNOSIS — N939 Abnormal uterine and vaginal bleeding, unspecified: Secondary | ICD-10-CM | POA: Diagnosis present

## 2015-10-16 DIAGNOSIS — N281 Cyst of kidney, acquired: Secondary | ICD-10-CM | POA: Insufficient documentation

## 2015-10-16 DIAGNOSIS — R011 Cardiac murmur, unspecified: Secondary | ICD-10-CM | POA: Diagnosis not present

## 2015-10-16 DIAGNOSIS — N765 Ulceration of vagina: Secondary | ICD-10-CM | POA: Diagnosis not present

## 2015-10-16 HISTORY — PX: CYSTOCELE REPAIR: SHX163

## 2015-10-16 LAB — CBC
HCT: 23.7 % — ABNORMAL LOW (ref 36.0–46.0)
HCT: 31.2 % — ABNORMAL LOW (ref 36.0–46.0)
Hemoglobin: 7.8 g/dL — ABNORMAL LOW (ref 12.0–15.0)
Hemoglobin: 10.5 g/dL — ABNORMAL LOW (ref 12.0–15.0)
MCH: 29.4 pg (ref 26.0–34.0)
MCH: 29.6 pg (ref 26.0–34.0)
MCHC: 32.9 g/dL (ref 30.0–36.0)
MCHC: 33.7 g/dL (ref 30.0–36.0)
MCV: 89.4 fL (ref 78.0–100.0)
MCV: 87.9 fL (ref 78.0–100.0)
Platelets: 191 10*3/uL (ref 150–400)
Platelets: 180 10*3/uL (ref 150–400)
RBC: 2.65 MIL/uL — ABNORMAL LOW (ref 3.87–5.11)
RBC: 3.55 MIL/uL — ABNORMAL LOW (ref 3.87–5.11)
RDW: 14.8 % (ref 11.5–15.5)
RDW: 15.2 % (ref 11.5–15.5)
WBC: 7.4 10*3/uL (ref 4.0–10.5)
WBC: 6.6 10*3/uL (ref 4.0–10.5)

## 2015-10-16 LAB — BASIC METABOLIC PANEL
Anion gap: 8 (ref 5–15)
BUN: 17 mg/dL (ref 6–20)
CO2: 28 mmol/L (ref 22–32)
Calcium: 8.1 mg/dL — ABNORMAL LOW (ref 8.9–10.3)
Chloride: 104 mmol/L (ref 101–111)
Creatinine, Ser: 0.93 mg/dL (ref 0.44–1.00)
GFR calc Af Amer: >60 mL/min (ref >60)
GFR calc non Af Amer: >60 mL/min (ref >60)
Glucose, Bld: 113 mg/dL — ABNORMAL HIGH (ref 65–99)
Potassium: 4 mmol/L (ref 3.5–5.1)
Sodium: 140 mmol/L (ref 135–145)

## 2015-10-16 LAB — ABO/RH: ABO/RH(D): A POS

## 2015-10-16 LAB — PREPARE RBC (CROSSMATCH)

## 2015-10-16 LAB — PROTIME-INR
INR: 3.32 — ABNORMAL HIGH (ref 0.00–1.49)
Prothrombin Time: 33.1 seconds — ABNORMAL HIGH (ref 11.6–15.2)

## 2015-10-16 SURGERY — COLPORRHAPHY, ANTERIOR, FOR CYSTOCELE REPAIR
Anesthesia: General | Site: Vagina

## 2015-10-16 SURGERY — COLPORRHAPHY, ANTERIOR, FOR CYSTOCELE REPAIR
Anesthesia: General

## 2015-10-16 MED ORDER — CITRIC ACID-SODIUM CITRATE 334-500 MG/5ML PO SOLN
30.0000 mL | Freq: Once | ORAL | Status: AC
  Administered 2015-10-16: 30 mL via ORAL
  Filled 2015-10-16: qty 15

## 2015-10-16 MED ORDER — PROPOFOL 10 MG/ML IV BOLUS
INTRAVENOUS | Status: DC | PRN
  Administered 2015-10-16: 120 mg via INTRAVENOUS

## 2015-10-16 MED ORDER — DIATRIZOATE MEGLUMINE & SODIUM 66-10 % PO SOLN: 30.0000 mL | Freq: Two times a day (BID) | ORAL | Status: DC

## 2015-10-16 MED ORDER — ONDANSETRON HCL 4 MG/2ML IJ SOLN: 4.0000 mg | Freq: Once | INTRAMUSCULAR | Status: DC | PRN

## 2015-10-16 MED ORDER — FUROSEMIDE 40 MG PO TABS
40.0000 mg | ORAL_TABLET | Freq: Every day | ORAL | Status: DC
  Administered 2015-10-17: 40 mg via ORAL
  Filled 2015-10-16: qty 1

## 2015-10-16 MED ORDER — SODIUM CHLORIDE 0.9 % IV SOLN
INTRAVENOUS | Status: DC
  Administered 2015-10-16: 16:00:00 via INTRAVENOUS

## 2015-10-16 MED ORDER — LIDOCAINE HCL (CARDIAC) 20 MG/ML IV SOLN
INTRAVENOUS | Status: AC
  Filled 2015-10-16: qty 5

## 2015-10-16 MED ORDER — BUPIVACAINE HCL (PF) 0.5 % IJ SOLN
INTRAMUSCULAR | Status: AC
  Filled 2015-10-16: qty 30

## 2015-10-16 MED ORDER — LIDOCAINE HCL (CARDIAC) 20 MG/ML IV SOLN
INTRAVENOUS | Status: DC | PRN
  Administered 2015-10-16: 80 mg via INTRAVENOUS

## 2015-10-16 MED ORDER — DIPHENHYDRAMINE HCL 50 MG/ML IJ SOLN
25.0000 mg | Freq: Once | INTRAMUSCULAR | Status: AC
  Administered 2015-10-16: 25 mg via INTRAVENOUS
  Filled 2015-10-16: qty 1

## 2015-10-16 MED ORDER — QUETIAPINE FUMARATE ER 400 MG PO TB24
400.0000 mg | ORAL_TABLET | Freq: Every day | ORAL | Status: DC
  Administered 2015-10-16: 400 mg via ORAL
  Filled 2015-10-16: qty 1

## 2015-10-16 MED ORDER — FAMOTIDINE IN NACL 20-0.9 MG/50ML-% IV SOLN
20.0000 mg | Freq: Once | INTRAVENOUS | Status: AC
  Administered 2015-10-16: 20 mg via INTRAVENOUS
  Filled 2015-10-16: qty 50

## 2015-10-16 MED ORDER — IOPAMIDOL (ISOVUE-300) INJECTION 61%
100.0000 mL | Freq: Once | INTRAVENOUS | Status: AC | PRN
  Administered 2015-10-16: 100 mL via INTRAVENOUS

## 2015-10-16 MED ORDER — WARFARIN SODIUM 5 MG PO TABS: 5.0000 mg | ORAL_TABLET | Freq: Every day | ORAL | Status: DC

## 2015-10-16 MED ORDER — WARFARIN SODIUM 5 MG PO TABS: 5.0000 mg | ORAL_TABLET | ORAL | Status: DC

## 2015-10-16 MED ORDER — FERRIC SUBSULFATE 259 MG/GM EX SOLN
CUTANEOUS | Status: DC | PRN
  Administered 2015-10-16: 1

## 2015-10-16 MED ORDER — SODIUM CHLORIDE 0.9 % IV SOLN
Freq: Once | INTRAVENOUS | Status: AC
  Administered 2015-10-16: 13:00:00 via INTRAVENOUS

## 2015-10-16 MED ORDER — ROSUVASTATIN CALCIUM 40 MG PO TABS
40.0000 mg | ORAL_TABLET | Freq: Every day | ORAL | Status: DC
  Administered 2015-10-16: 40 mg via ORAL
  Filled 2015-10-16: qty 1

## 2015-10-16 MED ORDER — LACTATED RINGERS IV SOLN: INTRAVENOUS | Status: DC

## 2015-10-16 MED ORDER — METOPROLOL SUCCINATE ER 25 MG PO TB24
25.0000 mg | ORAL_TABLET | Freq: Every day | ORAL | Status: DC
  Administered 2015-10-16 – 2015-10-17 (×2): 25 mg via ORAL
  Filled 2015-10-16 (×2): qty 1

## 2015-10-16 MED ORDER — WARFARIN - PHYSICIAN DOSING INPATIENT: Freq: Every day | Status: DC

## 2015-10-16 MED ORDER — MENTHOL 3 MG MT LOZG: 1.0000 | LOZENGE | OROMUCOSAL | Status: DC | PRN

## 2015-10-16 MED ORDER — PHENYLEPHRINE HCL 10 MG/ML IJ SOLN
INTRAMUSCULAR | Status: DC | PRN
  Administered 2015-10-16: 80 ug via INTRAVENOUS

## 2015-10-16 MED ORDER — SODIUM CHLORIDE 0.9 % IV SOLN: Freq: Once | INTRAVENOUS | Status: DC

## 2015-10-16 MED ORDER — ACETAMINOPHEN 325 MG PO TABS
650.0000 mg | ORAL_TABLET | Freq: Once | ORAL | Status: AC
  Administered 2015-10-16: 650 mg via ORAL
  Filled 2015-10-16: qty 2

## 2015-10-16 MED ORDER — MEPERIDINE HCL 25 MG/ML IJ SOLN: 6.2500 mg | INTRAMUSCULAR | Status: DC | PRN

## 2015-10-16 MED ORDER — MIDAZOLAM HCL 2 MG/2ML IJ SOLN
INTRAMUSCULAR | Status: DC | PRN
  Administered 2015-10-16: 1 mg via INTRAVENOUS

## 2015-10-16 MED ORDER — SODIUM CHLORIDE 0.9 % IV BOLUS (SEPSIS)
500.0000 mL | Freq: Once | INTRAVENOUS | Status: AC
  Administered 2015-10-16: 11:00:00 via INTRAVENOUS

## 2015-10-16 MED ORDER — FERRIC SUBSULFATE 259 MG/GM EX SOLN
CUTANEOUS | Status: AC
  Filled 2015-10-16: qty 8

## 2015-10-16 MED ORDER — CLONAZEPAM 0.5 MG PO TABS
0.5000 mg | ORAL_TABLET | Freq: Two times a day (BID) | ORAL | Status: DC
  Administered 2015-10-16 – 2015-10-17 (×2): 0.5 mg via ORAL
  Filled 2015-10-16 (×2): qty 1

## 2015-10-16 MED ORDER — MIDAZOLAM HCL 2 MG/2ML IJ SOLN
INTRAMUSCULAR | Status: AC
  Filled 2015-10-16: qty 2

## 2015-10-16 MED ORDER — SODIUM CHLORIDE 0.9 % IV SOLN
Freq: Once | INTRAVENOUS | Status: AC
  Administered 2015-10-16: 14:00:00 via INTRAVENOUS

## 2015-10-16 MED ORDER — PANTOPRAZOLE SODIUM 40 MG PO TBEC
40.0000 mg | DELAYED_RELEASE_TABLET | Freq: Every day | ORAL | Status: DC
  Administered 2015-10-16 – 2015-10-17 (×2): 40 mg via ORAL
  Filled 2015-10-16: qty 1

## 2015-10-16 MED ORDER — LAMOTRIGINE 200 MG PO TABS
200.0000 mg | ORAL_TABLET | Freq: Every day | ORAL | Status: DC
  Administered 2015-10-16: 200 mg via ORAL
  Filled 2015-10-16: qty 1

## 2015-10-16 MED ORDER — OXYCODONE-ACETAMINOPHEN 5-325 MG PO TABS: 1.0000 | ORAL_TABLET | Freq: Four times a day (QID) | ORAL | Status: DC | PRN

## 2015-10-16 MED ORDER — IBUPROFEN 600 MG PO TABS: 600.0000 mg | ORAL_TABLET | Freq: Four times a day (QID) | ORAL | Status: DC | PRN

## 2015-10-16 MED ORDER — SODIUM CHLORIDE 0.9 % IJ SOLN
INTRAMUSCULAR | Status: AC
  Filled 2015-10-16: qty 10

## 2015-10-16 MED ORDER — FUROSEMIDE 10 MG/ML IJ SOLN
20.0000 mg | Freq: Once | INTRAMUSCULAR | Status: DC
  Filled 2015-10-16: qty 2

## 2015-10-16 MED ORDER — ALBUTEROL SULFATE (2.5 MG/3ML) 0.083% IN NEBU: 3.0000 mL | INHALATION_SOLUTION | Freq: Four times a day (QID) | RESPIRATORY_TRACT | Status: DC | PRN

## 2015-10-16 MED ORDER — ONDANSETRON HCL 4 MG/2ML IJ SOLN
INTRAMUSCULAR | Status: AC
  Filled 2015-10-16: qty 2

## 2015-10-16 MED ORDER — FENTANYL CITRATE (PF) 100 MCG/2ML IJ SOLN
INTRAMUSCULAR | Status: DC | PRN
  Administered 2015-10-16: 25 ug via INTRAVENOUS
  Administered 2015-10-16: 50 ug via INTRAVENOUS

## 2015-10-16 MED ORDER — PROPOFOL 10 MG/ML IV BOLUS
INTRAVENOUS | Status: AC
  Filled 2015-10-16: qty 20

## 2015-10-16 MED ORDER — FENTANYL CITRATE (PF) 100 MCG/2ML IJ SOLN
INTRAMUSCULAR | Status: AC
  Filled 2015-10-16: qty 2

## 2015-10-16 MED ORDER — ASPIRIN EC 81 MG PO TBEC
81.0000 mg | DELAYED_RELEASE_TABLET | Freq: Every day | ORAL | Status: DC
  Administered 2015-10-16 – 2015-10-17 (×2): 81 mg via ORAL
  Filled 2015-10-16 (×2): qty 1

## 2015-10-16 MED ORDER — WARFARIN SODIUM 2.5 MG PO TABS
2.5000 mg | ORAL_TABLET | ORAL | Status: DC
  Administered 2015-10-16: 2.5 mg via ORAL
  Filled 2015-10-16: qty 1

## 2015-10-16 MED ORDER — FUROSEMIDE 10 MG/ML IJ SOLN
INTRAMUSCULAR | Status: DC | PRN
  Administered 2015-10-16: 20 mg via INTRAVENOUS

## 2015-10-16 MED ORDER — HYDROMORPHONE HCL 1 MG/ML IJ SOLN: 0.2500 mg | INTRAMUSCULAR | Status: DC | PRN

## 2015-10-16 SURGICAL SUPPLY — 22 items
APPLICATOR ARISTA FLEXITIP XL (MISCELLANEOUS) ×3 IMPLANT
CLOTH BEACON ORANGE TIMEOUT ST (SAFETY) ×3 IMPLANT
CONT PATH 16OZ SNAP LID 3702 (MISCELLANEOUS) IMPLANT
DECANTER SPIKE VIAL GLASS SM (MISCELLANEOUS) IMPLANT
GLOVE BIOGEL PI IND STRL 6.5 (GLOVE) ×1 IMPLANT
GLOVE BIOGEL PI IND STRL 7.0 (GLOVE) ×2 IMPLANT
GLOVE BIOGEL PI INDICATOR 6.5 (GLOVE) ×2
GLOVE BIOGEL PI INDICATOR 7.0 (GLOVE) ×4
GLOVE ECLIPSE 7.0 STRL STRAW (GLOVE) ×6 IMPLANT
GOWN STRL REUS W/TWL LRG LVL3 (GOWN DISPOSABLE) ×3 IMPLANT
HEMOSTAT ARISTA ABSORB 3G PWDR (MISCELLANEOUS) ×3 IMPLANT
NEEDLE HYPO 22GX1.5 SAFETY (NEEDLE) IMPLANT
NEEDLE SPNL 22GX3.5 QUINCKE BK (NEEDLE) IMPLANT
NS IRRIG 1000ML POUR BTL (IV SOLUTION) ×3 IMPLANT
PACK VAGINAL WOMENS (CUSTOM PROCEDURE TRAY) ×3 IMPLANT
SUT CHROMIC 0 SH (SUTURE) IMPLANT
SUT CHROMIC 2 0 SH (SUTURE) IMPLANT
SUT VIC AB 0 CT1 27 (SUTURE)
SUT VIC AB 0 CT1 27XBRD ANBCTR (SUTURE) IMPLANT
TOWEL OR 17X24 6PK STRL BLUE (TOWEL DISPOSABLE) ×6 IMPLANT
TRAY FOLEY CATH SILVER 14FR (SET/KITS/TRAYS/PACK) IMPLANT
WATER STERILE IRR 1000ML POUR (IV SOLUTION) IMPLANT

## 2015-10-16 NOTE — Transfer of Care (Signed)
Immediate Anesthesia Transfer of Care Note  Patient: [name]  Procedure(s) Performed: Procedure(s): ANTERIOR REPAIR (CYSTOCELE) (N/A)  Patient Location: PACU  Anesthesia Type:General  Level of Consciousness: awake, alert  and oriented  Airway & Oxygen Therapy: Patient Spontanous Breathing and Patient connected to nasal cannula oxygen  Post-op Assessment: Report given to RN and Post -op Vital signs reviewed and stable  Post vital signs: Reviewed and stable  Last Vitals:  Filed Vitals:   10/16/15 1348 10/16/15 1516  BP: 118/65 152/66  Pulse: 76 77  Temp: 36.6 C 36.7 C  Resp:  15    Last Pain:  Filed Vitals:   10/16/15 1522  PainSc: 8          Complications: No apparent anesthesia complications

## 2015-10-16 NOTE — Op Note (Signed)
Preoperative diagnosis: vaginal bleeding, acute blood loss anemia  Postoperative diagnosis: Same, vaginal ulceration  Procedure: Exam under anesthesia, vaginal biopsies, vaginal cauterization, transfusion of 2 u PRBC's and 2 u FFP  Surgeon: Darron Doom, M.D.  Anesthesia: MAC, Lyn Hollingshead, MD  Findings: ulceration with adherent clot and discoloration of vagina, continued bleeding noted  Estimated blood loss: 100 cc  Specimen: Vaginal Biopsies to pathology  Reason for procedure: Patient is a 65 y.o. [id number] patient who is hospitalized for vaginal bleeding with significant drop in hgb. Multiple attempts to control bleeding as an outpatient had previously failed.  Procedure: Patient taken to the OR where she was placed in dorsal lithotomy in [place]. A timeout was performed. A speculum was placed inside the vagina. The above findings were noted. Multiple biopsies of the vaginal cuff were taken and submitted for frozen section. Edges of the defect were cauterized as was the ulcer itself. Monsel's was placed to achieve hemostasis. Area was watched for five minutes and no further bleeding noted. The speculum was removed from the vagina. The patient was awakened. All instrument, needle, and lap counts correct x2. The patient was taken to recovery, stable. A foley was placed in the bladder.  Donnamae Jude, MD 10/16/2015 3:11 PM

## 2015-10-16 NOTE — Anesthesia Preprocedure Evaluation (Addendum)
Anesthesia Evaluation  Patient identified by MRN, date of birth, ID band Patient awake    Reviewed: Allergy & Precautions, H&P , NPO status , Patient's Chart, lab work & pertinent test results  Airway Mallampati: II  TM Distance: >3 FB Neck ROM: full    Dental no notable dental hx. (+) Teeth Intact   Pulmonary former smoker,    Pulmonary exam normal        Cardiovascular hypertension, Pt. on medications Normal cardiovascular exam     Neuro/Psych negative neurological ROS     GI/Hepatic negative GI ROS, Neg liver ROS,   Endo/Other  Morbid obesity  Renal/GU      Musculoskeletal   Abdominal (+) + obese,   Peds  Hematology   Anesthesia Other Findings   Reproductive/Obstetrics negative OB ROS                            Anesthesia Physical Anesthesia Plan  ASA: III  Anesthesia Plan: General   Post-op Pain Management:    Induction: Intravenous  Airway Management Planned: LMA  Additional Equipment:   Intra-op Plan:   Post-operative Plan:   Informed Consent: I have reviewed the patients History and Physical, chart, labs and discussed the procedure including the risks, benefits and alternatives for the proposed anesthesia with the patient or authorized representative who has indicated his/her understanding and acceptance.   Dental Advisory Given  Plan Discussed with: CRNA and Surgeon  Anesthesia Plan Comments:         Anesthesia Quick Evaluation

## 2015-10-16 NOTE — Anesthesia Procedure Notes (Signed)
Procedure Name: LMA Insertion Date/Time: 10/16/2015 2:18 PM Performed by: Georgeanne Nim Pre-anesthesia Checklist: Patient identified, Emergency Drugs available, Suction available, Patient being monitored and Timeout performed Patient Re-evaluated:Patient Re-evaluated prior to inductionOxygen Delivery Method: Circle system utilized Preoxygenation: Pre-oxygenation with 100% oxygen Intubation Type: IV induction Ventilation: Mask ventilation without difficulty LMA: LMA inserted LMA Size: 4.0 Number of attempts: 1 Placement Confirmation: positive ETCO2,  CO2 detector and breath sounds checked- equal and bilateral Tube secured with: Tape Dental Injury: Teeth and Oropharynx as per pre-operative assessment

## 2015-10-16 NOTE — Anesthesia Postprocedure Evaluation (Signed)
Anesthesia Post Note  Patient: [name]  Procedure(s) Performed: Procedure(s) (LRB): ANTERIOR REPAIR (CYSTOCELE) (N/A)  Patient location during evaluation: PACU Anesthesia Type: General Level of consciousness: sedated Pain management: pain level controlled Vital Signs Assessment: post-procedure vital signs reviewed and stable Respiratory status: spontaneous breathing Cardiovascular status: stable Postop Assessment: no signs of nausea or vomiting Anesthetic complications: no     Last Vitals:  Filed Vitals:   10/16/15 1615 10/16/15 1633  BP: 163/72 173/71  Pulse: 66 66  Temp: 36.7 C 36.5 C  Resp: 20 18    Last Pain:  Filed Vitals:   10/16/15 1641  PainSc: 8    Pain Goal:                 Gervis Gaba JR,JOHN Magnum Lunde

## 2015-10-16 NOTE — MAU Note (Signed)
Pt states vaginal bleeding from last week has not stopped.  Pt states is weak and cannot walk.

## 2015-10-16 NOTE — H&P (Signed)
[name] is an 65 y.o. G4P2002 female.   Chief Complaint: vaginal bleeding HPI: Patient presents for the fifth time with significant vaginal bleeding. So much today, that she was unable to walk and is quite fatigued. Per her daughter, she went through a box of chuck pads. She has been treated with AgNO3, Monsel's, Gel Foam, packing and though all have worked for a short period of time, her bleeding has returned and been quite brisk. She has a complicated medical history that includes aortic valve repair and lifelong Coumadin. She has been therapeutic on her INR.  Past Medical History  Diagnosis Date  . Anemia   . Anxiety   . Depression   . Diverticulosis   . Elevated cholesterol   . Obesity   . THYROMEGALY 07/22/2010  . Overweight(278.02) 03/11/2010  . Mixed hyperlipidemia 03/11/2010  . MEASLES, HX OF 03/11/2010  . KNEE PAIN, RIGHT 05/28/2010  . HYPERTRIGLYCERIDEMIA 03/11/2010  . HEART MURMUR, HX OF 03/11/2010  . FIBROIDS, UTERUS 03/11/2010  . Diarrhea 08/05/2010  . CONSTIPATION 03/11/2010  . CHICKENPOX, HX OF 03/11/2010  . BIPOLAR AFFECTIVE DISORDER 03/11/2010  . AORTIC VALVE REPLACEMENT, HX OF 03/11/2010  . ANEMIA 05/28/2010  . Acute bronchitis 07/22/2010  . Abdominal pain, generalized 03/19/2010  . Peripheral edema 11/18/2010  . Bacterial vaginitis 11/18/2010  . Hematuria 12/18/2010  . Diverticulitis 12/18/2010  . Hyperthyroidism 12/18/2010  . Fatigue 12/30/2010  . Renal insufficiency 12/30/2010  . Grave's disease 8-12  . Arm lesion 03/23/2011  . UTI (lower urinary tract infection) 08/14/2011  . Breast pain in female 11/17/2011  . Chest pain 11/17/2011  . Hematoma 12/18/2011  . Cervical cancer screening 03/22/2012  . Foot pain, left 03/22/2012  . Foot fracture, left 03/22/2012  . Tick bite of back 10/15/2012  . Preop examination 02/03/2013  . Dysrhythmia   . Hypertension   . Shortness of breath     with exertion   . Heart murmur   . Urinary tract bacterial infections   .  Arthritis     Past Surgical History  Procedure Laterality Date  . Appendectomy      Required revision for EColi infection, required recurrent packing  . Bowel obstruction      Requiring adhesions to be removed  . Aortic valve replacement    . Open heart with a cardiac aneurysm repair    . Cholecystectomy    . Tubal ligation    . Varicose vein surgery b/l legs    . Childhood exploratory surgery of heart    . Hernia repair  08-16-10  . Abdominal hysterectomy  ?1998    sb/l spo, total for fibroids/heavy bleeding  . Total knee arthroplasty Right 02/24/2013    Procedure: RIGHT TOTAL KNEE ARTHROPLASTY;  Surgeon: Johnn Hai, MD;  Location: [facility];  Service: Orthopedics;  Laterality: Right;  . Left heart catheterization with coronary angiogram N/A 12/05/2011    Procedure: LEFT HEART CATHETERIZATION WITH CORONARY ANGIOGRAM;  Surgeon: Sinclair Grooms, MD;  Location: [facility] CATH LAB;  Service: Cardiovascular;  Laterality: N/A;    Family History  Problem Relation Age of Onset  . Scoliosis Mother   . Arthritis Mother     Rheumatoid  . Aneurysm Mother     heart  . Alcohol abuse Father   . Depression Sister   . Depression Brother   . Alcohol abuse Brother   . Cancer Maternal Grandmother     colon  . Diabetes Maternal Grandfather   . Heart  disease Maternal Grandfather   . Alcohol abuse Maternal Grandfather   . Depression Paternal Grandmother   . Diabetes Paternal Grandmother   . Depression Paternal Grandfather   . Diabetes Paternal Grandfather   . Depression Sister   . Alcohol abuse Brother   . Depression Brother   . Heart disease Brother    Social History:  reports that she quit smoking about 25 years ago. She has never used smokeless tobacco. She reports that she drinks alcohol. She reports that she does not use illicit drugs.  Allergies:  Allergies  Allergen Reactions  . Sulfonamide Derivatives     headaches    No current facility-administered medications on file prior to  encounter.   Current Outpatient Prescriptions on File Prior to Encounter  Medication Sig Dispense Refill  . aspirin EC 81 MG tablet Take 81 mg by mouth daily.    . clonazePAM (KLONOPIN) 0.5 MG tablet Take 0.5 mg by mouth 2 (two) times daily.     . Estradiol 10 MCG TABS vaginal tablet One tablet in vagina daily for 2 weeks then 3 times a week for total of 8 weeks 30 tablet 2  . furosemide (LASIX) 40 MG tablet Take 1 tablet (40 mg total) by mouth daily. 30 tablet 11  . lamoTRIgine (LAMICTAL) 200 MG tablet Take 200 mg by mouth at bedtime.    . metoprolol succinate (TOPROL-XL) 25 MG 24 hr tablet TAKE 1 TABLET (25 MG TOTAL) BY MOUTH EVERY MORNING. 30 tablet 4  . pantoprazole (PROTONIX) 40 MG tablet TAKE 1 TABLET (40 MG TOTAL) BY MOUTH DAILY. 30 tablet 5  . rosuvastatin (CRESTOR) 40 MG tablet TAKE 1 TABLET (40 MG TOTAL) BY MOUTH DAILY. 30 tablet 6  . SEROQUEL XR 400 MG 24 hr tablet Take 400 mg by mouth at bedtime.   1  . warfarin (COUMADIN) 5 MG tablet TAKE AS DIRECTED BY ANTICOAGULATION CLINIC (Patient taking differently: Take 2.5mg  on Tuesday and Saturday, all other days take 5mg .) 35 tablet 2  . albuterol (PROVENTIL HFA;VENTOLIN HFA) 108 (90 BASE) MCG/ACT inhaler Inhale 2 puffs into the lungs every 6 (six) hours as needed for wheezing or shortness of breath. 1 Inhaler 0  . glucose blood test strip Use once daily to check blood sugar.  DX E11.9 100 each 3  . ONE TOUCH LANCETS MISC Test once daily to check blood sugar. DX E11.9 100 each 3  . [DISCONTINUED] methimazole (TAPAZOLE) 5 MG tablet Take 1 tablet (5 mg total) by mouth 3 (three) times daily. 30 tablet 2    Pertinent items are noted in HPI.  Blood pressure 115/60, pulse 71, temperature 97.4 F (36.3 C), temperature source Oral, resp. rate 18. General appearance: alert, cooperative, appears stated age, moderately obese and pale Lungs: normal effort Heart: regular rate and rhythm and SEM noted Abdomen: soft, non-tender; bowel sounds normal;  no masses,  no organomegaly Pelvic: external genitalia normal, uterus surgically absent and frank bleeding noted and covering legs and perineum. Large clot removed from introitus. small 1.5 cm clot adherent to the vagina with ongoing bleeding noted. Some brown discoloration of vagina noted. Skin: Skin color, texture, turgor normal. No rashes or lesions Neurologic: Grossly normal   CBC    Component Value Date/Time   WBC 7.4 10/16/2015 1056   WBC 4.4 06/16/2011 0953   RBC 2.65* 10/16/2015 1056   RBC 4.11 06/16/2011 0953   RBC 3.35* 02/20/2011 0820   HGB 7.8* 10/16/2015 1056   HGB 12.0 06/16/2011 0953  HCT 23.7* 10/16/2015 1056   HCT 35.6 06/16/2011 0953   PLT 191 10/16/2015 1056   PLT 233 06/16/2011 0953   MCV 89.4 10/16/2015 1056   MCV 86.6 06/16/2011 0953   MCH 29.4 10/16/2015 1056   MCH 29.2 06/16/2011 0953   MCHC 32.9 10/16/2015 1056   MCHC 33.7 06/16/2011 0953   RDW 14.8 10/16/2015 1056   RDW 14.1 06/16/2011 0953   LYMPHSABS 2.5 09/11/2015 2228   LYMPHSABS 1.7 06/16/2011 0953   MONOABS 0.6 09/11/2015 2228   MONOABS 0.3 06/16/2011 0953   EOSABS 0.2 09/11/2015 2228   EOSABS 0.2 06/16/2011 0953   BASOSABS 0.1 09/11/2015 2228   BASOSABS 0.1 06/16/2011 0953    INR 2.8 1 wk ago  Assessment/Plan Principal Problem:   Abnormal vaginal bleeding Active Problems:   Acute blood loss anemia   Vaginal lesion  To the OR for exam under anesthesia, improved biopsy and control of bleeding. Will likely need some blood given her heart, age and co-morbidities. Risks include but are not limited to bleeding, infection, injury to surrounding structures. Likelihood of success is moderate.    Tykera Skates S 10/16/2015, 11:06 AM

## 2015-10-17 ENCOUNTER — Ambulatory Visit: Payer: BLUE CROSS/BLUE SHIELD | Admitting: Obstetrics & Gynecology

## 2015-10-17 ENCOUNTER — Encounter (HOSPITAL_COMMUNITY): Payer: Self-pay | Admitting: Family Medicine

## 2015-10-17 LAB — CBC
HCT: 27.8 % — ABNORMAL LOW (ref 36.0–46.0)
Hemoglobin: 9.3 g/dL — ABNORMAL LOW (ref 12.0–15.0)
MCH: 29.2 pg (ref 26.0–34.0)
MCHC: 33.5 g/dL (ref 30.0–36.0)
MCV: 87.1 fL (ref 78.0–100.0)
Platelets: 159 10*3/uL (ref 150–400)
RBC: 3.19 MIL/uL — ABNORMAL LOW (ref 3.87–5.11)
RDW: 16.1 % — ABNORMAL HIGH (ref 11.5–15.5)
WBC: 5.4 10*3/uL (ref 4.0–10.5)

## 2015-10-17 LAB — PROTIME-INR
INR: 2.32 — ABNORMAL HIGH (ref 0.00–1.49)
Prothrombin Time: 25.2 seconds — ABNORMAL HIGH (ref 11.6–15.2)

## 2015-10-17 LAB — PREPARE FRESH FROZEN PLASMA
Unit division: 0
Unit division: 0

## 2015-10-17 NOTE — Progress Notes (Signed)
Pt ambulated out teaching complete  

## 2015-10-17 NOTE — Discharge Instructions (Signed)

## 2015-10-17 NOTE — Discharge Summary (Signed)
Physician Discharge Summary  Patient ID: [name] MRN: [id number] DOB/AGE: Jun 16, 1950 65 y.o.  Admit date: 10/16/2015 Discharge date:   Admission Diagnoses:  Principal Problem:   Abnormal vaginal bleeding Active Problems:   Acute blood loss anemia   Vaginal lesion   Discharge Diagnoses:  Same  Past Medical History  Diagnosis Date  . Anemia   . Anxiety   . Depression   . Diverticulosis   . Elevated cholesterol   . Obesity   . THYROMEGALY 07/22/2010  . Overweight(278.02) 03/11/2010  . Mixed hyperlipidemia 03/11/2010  . MEASLES, HX OF 03/11/2010  . KNEE PAIN, RIGHT 05/28/2010  . HYPERTRIGLYCERIDEMIA 03/11/2010  . HEART MURMUR, HX OF 03/11/2010  . FIBROIDS, UTERUS 03/11/2010  . Diarrhea 08/05/2010  . CONSTIPATION 03/11/2010  . CHICKENPOX, HX OF 03/11/2010  . BIPOLAR AFFECTIVE DISORDER 03/11/2010  . AORTIC VALVE REPLACEMENT, HX OF 03/11/2010  . ANEMIA 05/28/2010  . Acute bronchitis 07/22/2010  . Abdominal pain, generalized 03/19/2010  . Peripheral edema 11/18/2010  . Bacterial vaginitis 11/18/2010  . Hematuria 12/18/2010  . Diverticulitis 12/18/2010  . Hyperthyroidism 12/18/2010  . Fatigue 12/30/2010  . Renal insufficiency 12/30/2010  . Grave's disease 8-12  . Arm lesion 03/23/2011  . UTI (lower urinary tract infection) 08/14/2011  . Breast pain in female 11/17/2011  . Chest pain 11/17/2011  . Hematoma 12/18/2011  . Cervical cancer screening 03/22/2012  . Foot pain, left 03/22/2012  . Foot fracture, left 03/22/2012  . Tick bite of back 10/15/2012  . Preop examination 02/03/2013  . Dysrhythmia   . Hypertension   . Shortness of breath     with exertion   . Heart murmur   . Urinary tract bacterial infections   . Arthritis     Surgeries: Procedure(s): Exam under anesthesia with vaginal biopsies and cautery of ulcer on 10/16/2015   Consultants:  By Phone [name], Dix  Discharged Condition: Improved  Hospital Course: [name] is an 65 y.o. female  G2P2002 who was admitted 10/16/2015 with a chief complaint of  Chief Complaint  Patient presents with  . Vaginal Bleeding  , and found to have a diagnosis of Abnormal vaginal bleeding.  They were brought to the operating room on 10/16/2015 and underwent the above named procedures.   She also was noted to have acute blood loss anemia, and required transfusion of 3 u PRBC's and 2 u FFP. She is anti-coagulated and consideration was given to reversing her coumadin and discussion had with cards, decided to just ive FFP, which would just lower her INR temporarily.  They were given sequential compression devices, early ambulation for DVT prophylaxis. Postoperative she stopped bleeding, her hgb remained stable.  They benefited maximally from their hospital stay and there were no complications.    Recent vital signs:  Filed Vitals:   10/17/15 0123 10/17/15 0556  BP: 92/38 116/56  Pulse: 71 62  Temp: 98 F (36.7 C) 97.5 F (36.4 C)  Resp: 14 16   Physical Examination: General appearance - alert, well appearing, and in no distress Chest - normal effort Abdomen - soft, nontender, nondistended, no masses or organomegaly Extremities - Homan's sign negative bilaterally Skin - normal coloration and turgor, no rashes, no suspicious skin lesions noted  Recent laboratory studies:  Results for orders placed or performed during the hospital encounter of 10/16/15  CBC  Result Value Ref Range   WBC 7.4 4.0 - 10.5 K/uL   RBC 2.65 (L) 3.87 - 5.11 MIL/uL  Hemoglobin 7.8 (L) 12.0 - 15.0 g/dL   HCT 23.7 (L) 36.0 - 46.0 %   MCV 89.4 78.0 - 100.0 fL   MCH 29.4 26.0 - 34.0 pg   MCHC 32.9 30.0 - 36.0 g/dL   RDW 14.8 11.5 - 15.5 %   Platelets 191 150 - 400 K/uL  Protime-INR  Result Value Ref Range   Prothrombin Time 33.1 (H) 11.6 - 15.2 seconds   INR 3.32 (H) 0.00 - 99991111  Basic metabolic panel  Result Value Ref Range   Sodium 140 135 - 145 mmol/L   Potassium 4.0 3.5 - 5.1 mmol/L   Chloride 104 101 -  111 mmol/L   CO2 28 22 - 32 mmol/L   Glucose, Bld 113 (H) 65 - 99 mg/dL   BUN 17 6 - 20 mg/dL   Creatinine, Ser 0.93 0.44 - 1.00 mg/dL   Calcium 8.1 (L) 8.9 - 10.3 mg/dL   GFR calc non Af Amer >60 >60 mL/min   GFR calc Af Amer >60 >60 mL/min   Anion gap 8 5 - 15  CBC  Result Value Ref Range   WBC 6.6 4.0 - 10.5 K/uL   RBC 3.55 (L) 3.87 - 5.11 MIL/uL   Hemoglobin 10.5 (L) 12.0 - 15.0 g/dL   HCT 31.2 (L) 36.0 - 46.0 %   MCV 87.9 78.0 - 100.0 fL   MCH 29.6 26.0 - 34.0 pg   MCHC 33.7 30.0 - 36.0 g/dL   RDW 15.2 11.5 - 15.5 %   Platelets 180 150 - 400 K/uL  CBC  Result Value Ref Range   WBC 5.4 4.0 - 10.5 K/uL   RBC 3.19 (L) 3.87 - 5.11 MIL/uL   Hemoglobin 9.3 (L) 12.0 - 15.0 g/dL   HCT 27.8 (L) 36.0 - 46.0 %   MCV 87.1 78.0 - 100.0 fL   MCH 29.2 26.0 - 34.0 pg   MCHC 33.5 30.0 - 36.0 g/dL   RDW 16.1 (H) 11.5 - 15.5 %   Platelets 159 150 - 400 K/uL  Protime-INR daily while on Coumadin  Result Value Ref Range   Prothrombin Time 25.2 (H) 11.6 - 15.2 seconds   INR 2.32 (H) 0.00 - 1.49  Type and screen  Result Value Ref Range   ABO/RH(D) A POS    Antibody Screen NEG    Sample Expiration 10/19/2015    Unit Number [id number]    Blood Component Type RED CELLS,LR    Unit division 00    Status of Unit ISSUED    Transfusion Status OK TO TRANSFUSE    Crossmatch Result Compatible    Unit Number [id number]    Blood Component Type RED CELLS,LR    Unit division 00    Status of Unit ISSUED    Transfusion Status OK TO TRANSFUSE    Crossmatch Result Compatible    Unit Number [id number]    Blood Component Type RED CELLS,LR    Unit division 00    Status of Unit ISSUED    Transfusion Status OK TO TRANSFUSE    Crossmatch Result Compatible    Unit Number [id number]    Blood Component Type RED CELLS,LR    Unit division 00    Status of Unit ALLOCATED    Transfusion Status OK TO TRANSFUSE    Crossmatch Result Compatible    Unit Number [id number]    Blood Component  Type RED CELLS,LR    Unit division 00    Status of Unit ALLOCATED  Transfusion Status OK TO TRANSFUSE    Crossmatch Result Compatible   Prepare RBC  Result Value Ref Range   Order Confirmation ORDER PROCESSED BY BLOOD BANK   ABO/Rh  Result Value Ref Range   ABO/RH(D) A POS   Prepare fresh frozen plasma  Result Value Ref Range   Unit Number [id number]    Blood Component Type THAWED PLASMA    Unit division 00    Status of Unit ISSUED    Transfusion Status OK TO TRANSFUSE    Unit Number [id number]    Blood Component Type THAWED PLASMA    Unit division 00    Status of Unit ISSUED    Transfusion Status OK TO TRANSFUSE   Prepare RBC  Result Value Ref Range   Order Confirmation ORDER PROCESSED BY BLOOD BANK     Discharge Medications:     Medication List    TAKE these medications        albuterol 108 (90 Base) MCG/ACT inhaler  Commonly known as:  PROVENTIL HFA;VENTOLIN HFA  Inhale 2 puffs into the lungs every 6 (six) hours as needed for wheezing or shortness of breath.     aspirin EC 81 MG tablet  Take 81 mg by mouth daily.     clonazePAM 0.5 MG tablet  Commonly known as:  KLONOPIN  Take 0.5 mg by mouth 2 (two) times daily.     Estradiol 10 MCG Tabs vaginal tablet  One tablet in vagina daily for 2 weeks then 3 times a week for total of 8 weeks     furosemide 40 MG tablet  Commonly known as:  LASIX  Take 1 tablet (40 mg total) by mouth daily.     glucose blood test strip  Use once daily to check blood sugar.  DX E11.9     lamoTRIgine 200 MG tablet  Commonly known as:  LAMICTAL  Take 200 mg by mouth at bedtime.     metoprolol succinate 25 MG 24 hr tablet  Commonly known as:  TOPROL-XL  TAKE 1 TABLET (25 MG TOTAL) BY MOUTH EVERY MORNING.     ONE TOUCH LANCETS Misc  Test once daily to check blood sugar. DX E11.9     pantoprazole 40 MG tablet  Commonly known as:  PROTONIX  TAKE 1 TABLET (40 MG TOTAL) BY MOUTH DAILY.     rosuvastatin 40 MG tablet   Commonly known as:  CRESTOR  TAKE 1 TABLET (40 MG TOTAL) BY MOUTH DAILY.     SEROQUEL XR 400 MG 24 hr tablet  Generic drug:  QUEtiapine  Take 400 mg by mouth at bedtime.     warfarin 5 MG tablet  Commonly known as:  COUMADIN  TAKE AS DIRECTED BY ANTICOAGULATION CLINIC        Diagnostic Studies: Ct Abdomen Pelvis W Contrast  10/16/2015  CLINICAL DATA:  Acute onset of vaginal pain and discharge. Recent cystocele repair. Initial encounter. EXAM: CT ABDOMEN AND PELVIS WITH CONTRAST TECHNIQUE: Multidetector CT imaging of the abdomen and pelvis was performed using the standard protocol following bolus administration of intravenous contrast. CONTRAST:  16mL ISOVUE-300 IOPAMIDOL (ISOVUE-300) INJECTION 61% COMPARISON:  CT of the chest, abdomen and pelvis from 08/05/2011 FINDINGS: The visualized lung bases are clear. The patient is status post median sternotomy. The liver and spleen are unremarkable in appearance. The patient is status post cholecystectomy, with clips noted at the gallbladder fossa. The pancreas and adrenal glands are unremarkable. A 1.9 cm cyst is noted at  the upper pole of the left kidney. There is no evidence of hydronephrosis. No renal or ureteral stones are seen. No perinephric stranding is appreciated. No free fluid is identified. The small bowel is unremarkable in appearance. The stomach is within normal limits. No acute vascular abnormalities are seen. Scattered calcification is seen along the abdominal aorta and its branches. A small periumbilical hernia is noted, just to the right of and superior to the umbilicus, containing only fat. The patient is status post appendectomy. Contrast progresses to the level of the mid transverse colon. The remainder of the colon is partially filled with stool. Scattered diverticulosis is noted along the proximal sigmoid colon, without evidence of diverticulitis. The bladder is decompressed, with a Foley catheter in place. Diffuse soft tissue  inflammation is noted about the vaginal cuff. Some of this may be postoperative, though acute infection is a concern. A small amount of air is noted at the vaginal cuff. No suspicious adnexal masses are seen. No inguinal lymphadenopathy is seen. No acute osseous abnormalities are identified. Vacuum phenomenon is noted at L4-L5. IMPRESSION: 1. Diffuse soft tissue inflammation about the vaginal cuff. Some of this may be postoperative, though acute infection is a concern. Small amount of air noted at the vaginal cuff. 2. Bladder decompressed and grossly unremarkable, though difficult to fully assess, with a Foley catheter in place. 3. Small left renal cyst noted. 4. Small periumbilical hernia noted, containing only fat. 5. Scattered calcification along the abdominal aorta and its branches. 6. Scattered diverticulosis along the proximal sigmoid colon, without evidence of diverticulitis. Electronically Signed   By: Garald Balding M.D.   On: 10/16/2015 20:25    Disposition: 01-Home or Self Care      Discharge Instructions    Ambulatory referral to Gastroenterology    Complete by:  As directed   Has vaginal ulceration and continued vaginal bleeding, despite hysterectomy--trying to ensure no primary colon issue last colonoscopy in 2008  What is the reason for referral?:  Colonoscopy     Call MD for:  extreme fatigue    Complete by:  As directed      Call MD for:  persistant dizziness or light-headedness    Complete by:  As directed      Call MD for:  persistant nausea and vomiting    Complete by:  As directed      Call MD for:  severe uncontrolled pain    Complete by:  As directed      Call MD for:  temperature >100.4    Complete by:  As directed      Call MD for:    Complete by:  As directed   Heavy vaginal bleeding     Diet - low sodium heart healthy    Complete by:  As directed      Increase activity slowly    Complete by:  As directed            Follow-up Information    Follow up with  [facility] On 10/29/2015.   Specialty:  Obstetrics and Gynecology   Why:  at 10 am   Contact information:   [place] [phone]       Signed: Donnamae Jude 10/17/2015, 9:23 AM

## 2015-10-18 ENCOUNTER — Encounter (HOSPITAL_COMMUNITY): Payer: Self-pay | Admitting: Family Medicine

## 2015-10-19 ENCOUNTER — Encounter: Payer: Self-pay | Admitting: Family Medicine

## 2015-10-19 LAB — TYPE AND SCREEN
ABO/RH(D): A POS
Antibody Screen: NEGATIVE
Unit division: 0
Unit division: 0
Unit division: 0
Unit division: 0
Unit division: 0

## 2015-10-21 ENCOUNTER — Encounter: Payer: Self-pay | Admitting: Family Medicine

## 2015-10-23 ENCOUNTER — Telehealth: Payer: Self-pay | Admitting: Gastroenterology

## 2015-10-23 ENCOUNTER — Telehealth: Payer: Self-pay | Admitting: Family Medicine

## 2015-10-23 NOTE — Telephone Encounter (Signed)
Patient want to be referred for a Colonoscopy

## 2015-10-23 NOTE — Telephone Encounter (Signed)
Dr Ardis Hughs the pt saw her OB/GYN Jeannetta Nap and was told that she needed to have a followup colon now.  She is in for recall 03/2017.  Please review Tonya Pratt's notes and advise if the recall colon needs to be changed.

## 2015-10-24 ENCOUNTER — Telehealth: Payer: Self-pay | Admitting: *Deleted

## 2015-10-24 NOTE — Telephone Encounter (Signed)
I gave Dr. Kennon Rounds the cell # for Dr. Ardis Hughs.  Dr. Kennon Rounds states they have discussed and someone from Dr. Eugenia Pancoast office will contact patient to schedule.  I phoned patient to let her know the update and to expect a call from Dr. Eugenia Pancoast office.  Patient was very [employer] attorney.

## 2015-10-24 NOTE — Telephone Encounter (Signed)
[name], I'm trying to understand the urgency for colonoscopy here.  My review of her notes shows severe vaginal bleeding in woman on coumadin. Please contact me or have Dr. Kennon Rounds contact me.  My cell is [phone].  I tried calling you through the clinic and was on hold for about 10 minutes, gave up.

## 2015-10-24 NOTE — Telephone Encounter (Signed)
Received a voicemail message from patient, left on 10/23/15.  Patient states she needs a colonoscopy.  States she had surgery with Dr. Kennon Rounds and she wanted her to have a colonoscopy as soon as possible.  States she would also like to get her pathology results from surgery.  Spoke with patient.  Explained she had been scheduled for appointment with Dr. Ardis Hughs at [place] on 12/25/15.  Patient states she would like something sooner and requests I call to see if I can get appt moved up.  I called Dr. Ardis Hughs office and was told there were no sooner appts with Dr. Ardis Hughs or any of the PAs in the practice.  I asked if they could put the patient on a wait list and call her if there is a cancellation.  They said they would do so and also encouraged the patient to call a couple of times a week to check as well.  I phoned patient and made her aware of the information.  Patient states understanding.  I also reviewed results of pathology from surgery and told patient there was no cancer.  Patient stated understanding.  Reminded patient of appt with Dr. Kennon Rounds on 10/29/15 at 10:15 am.  Patient states she will be here.

## 2015-10-24 NOTE — Telephone Encounter (Signed)
Patient contacted to schedule.  She is on coumadin.  She will keep the appt for 12/25/15.  She had vaginal wall surgery this week.  I have added her the cancellation list.  She states no current GI problems

## 2015-10-24 NOTE — Telephone Encounter (Signed)
Spoke with Dr. Kennon Rounds.  States she would really like to have colonoscopy completed sooner.  Due to bleeding and previous blood transfusions.  Called Dr. Ardis Hughs office and spoke with Larkin Ina.  Larkin Ina stated that he had sent a message to Dr. Ardis Hughs and that someone from the office would be calling the patient to schedule the colonoscopy sooner and they would either cancel her existing appt for 12/25/15 or might keep it for post follow up.  I phoned patient and had to leave a message asking her to return my call for an update regarding her colonoscopy.    Patient returned my call stating she was just speaking with someone from Dr. Ardis Hughs office who told her they couldn't get her in for the colonoscopy until after her appt on 12/25/15.  I explained to patient that wasn't what the office staff had told me.  I told patient I will send a message to Dr. Ardis Hughs myself to see if we can get this resolved.  I told patient I would be back in touch as soon as I hear from Dr. Ardis Hughs.  Patient states understanding.

## 2015-10-24 NOTE — Telephone Encounter (Signed)
OK, lets get her in for colonoscopy [facility], screening

## 2015-10-25 ENCOUNTER — Encounter (HOSPITAL_COMMUNITY): Payer: Self-pay | Admitting: *Deleted

## 2015-10-25 ENCOUNTER — Other Ambulatory Visit: Payer: Self-pay

## 2015-10-25 DIAGNOSIS — R9389 Abnormal findings on diagnostic imaging of other specified body structures: Secondary | ICD-10-CM

## 2015-10-25 DIAGNOSIS — N939 Abnormal uterine and vaginal bleeding, unspecified: Secondary | ICD-10-CM

## 2015-10-25 MED ORDER — NA SULFATE-K SULFATE-MG SULF 17.5-3.13-1.6 GM/177ML PO SOLN: 1.0000 | Freq: Once | ORAL | Status: DC

## 2015-10-25 NOTE — Addendum Note (Signed)
Addended by: Barron Alvine on: 10/25/2015 10:37 AM   Modules accepted: Orders

## 2015-10-25 NOTE — Telephone Encounter (Signed)
Pt has been set up for Colon on 11/01/15 830 am [facility] anti coag has also been sent to Dr Tamala Julian for Coumadin clearance.

## 2015-10-25 NOTE — Telephone Encounter (Signed)
10/25/2015   RE: [name] DOB: [birth year] MRN: [id number]   Dear Dr Tamala Julian,    We have scheduled the above patient for an endoscopic procedure. Our records show that she is on anticoagulation therapy.   Please advise as to how long the patient may come off her therapy of Coumadin prior to the procedure, which is scheduled for 11/01/15.  Please fax back/ or route  to Christian Mate RN at [phone].   Sincerely,    Christian Mate RN   Please respond by 10/26/15 and route back to Christian Mate RN

## 2015-10-25 NOTE — Telephone Encounter (Signed)
[name], profuse. On coumadin. Dr. Kennon Rounds saw a vaginal ulcer, biopsied and treated it. The biopsies showed no malignancy but there is still concern that this is indeed a malignant process.  Question if colon lesion, cancer invading into vagina.    [name], Lets get her in for colonoscopy next Thursday at [facility] with MAC sedation. She'll need to hold her coumadin for 5 days prior, can you please clear that with Dr. Pernell Dupre (cardiology).  Should also plan to get a CBC on Tuesday next week.   Dr. Kennon Rounds, As we discussed, we'll get her in next week.

## 2015-10-26 ENCOUNTER — Telehealth: Payer: Self-pay

## 2015-10-26 ENCOUNTER — Telehealth: Payer: Self-pay | Admitting: Interventional Cardiology

## 2015-10-26 ENCOUNTER — Ambulatory Visit (INDEPENDENT_AMBULATORY_CARE_PROVIDER_SITE_OTHER): Payer: BLUE CROSS/BLUE SHIELD | Admitting: Pharmacist

## 2015-10-26 DIAGNOSIS — I4891 Unspecified atrial fibrillation: Secondary | ICD-10-CM | POA: Diagnosis not present

## 2015-10-26 DIAGNOSIS — Z954 Presence of other heart-valve replacement: Secondary | ICD-10-CM

## 2015-10-26 DIAGNOSIS — Z5181 Encounter for therapeutic drug level monitoring: Secondary | ICD-10-CM | POA: Diagnosis not present

## 2015-10-26 DIAGNOSIS — Z952 Presence of prosthetic heart valve: Secondary | ICD-10-CM

## 2015-10-26 LAB — POCT INR: INR: 4.1

## 2015-10-26 MED ORDER — ENOXAPARIN SODIUM 120 MG/0.8ML ~~LOC~~ SOLN: 120.0000 mg | Freq: Two times a day (BID) | SUBCUTANEOUS | Status: DC

## 2015-10-26 NOTE — Telephone Encounter (Signed)
Pt has been notified that she can hold her coumadin 5 days prior to her procedure per Dr Tamala Julian.

## 2015-10-26 NOTE — Telephone Encounter (Signed)
Request for surgical clearance:  1. What type of surgery is being performed? Colonoscopy   2. When is this surgery scheduled? 11-01-15   3. Are there any medications that need to be held prior to surgery and how long?Can he stop Coumadin 5 days before-needs to stop it today -Need this before 5 today please  4. Name of physician performing surgery? Dr Owens Loffler   5. What is your office phone and fax number?[phone] and fax number is [phone] Att: Patty  6.

## 2015-10-26 NOTE — Telephone Encounter (Signed)
-----   Message from Barron Alvine, RN sent at 10/25/2015 10:37 AM EDT ----- Waiting for anti coag response from Dr Tamala Julian

## 2015-10-26 NOTE — Patient Instructions (Addendum)
6/2 and 6/3: No Coumadin or Lovenox   6/4: Inject Lovenox 120mg  in the fatty abdominal tissue at least 2 inches from the belly button twice a day about 12 hours apart, 8am and 8pm rotate sites. No Coumadin  6/5: Inject Lovenox in the fatty tissue every 12 hours, 8am and 8pm. No Coumadin  6/6: Inject Lovenox in the fatty tissue every 12 hours, 8am and 8pm. No Coumadin  6/7: Inject Lovenox in the fatty tissue in the morning at 8 am (No pm dose). No Coumadin  6/8: Procedure Day - No Lovenox - Resume Coumadin in the evening or as directed by doctor (take an extra half tablet with usual dose for 2 days then resume normal dose)  6/9: Resume Lovenox inject in the fatty tissue every 12 hours and take Coumadin   6/10: Inject Lovenox in the fatty tissue every 12 hours and take Coumadin  6/11: Inject Lovenox in the fatty tissue every 12 hours and take Coumadin  6/12: Inject Lovenox in the fatty tissue every 12 hours and take Coumadin  6/13: Coumadin appt to check INR

## 2015-10-26 NOTE — Telephone Encounter (Signed)
Patient has a history of mechanical AVR and afib, INR goal 2.5-3.5. Per our protocol, would bridge with Lovenox. Patient has done Lovenox injections previously. Are you ok with this Dr. Tamala Julian?

## 2015-10-26 NOTE — Telephone Encounter (Signed)
Received verbal confirmation from Dr. Tamala Julian that pt ok to Lovenox bridge. Already coordinated in Coumadin clinic and instructions given to pt. Will route updated clearance.

## 2015-10-26 NOTE — Telephone Encounter (Signed)
Hold Coumadin for 5 days prior to colonoscopy. Resume maintenance Coumadin therapy under the direction of the Coumadin clinic as soon as possible after the procedure.

## 2015-10-26 NOTE — Telephone Encounter (Signed)
[name] with Cardiology will get a request to Dr Tamala Julian for a response by today at 5 pm

## 2015-10-26 NOTE — Telephone Encounter (Signed)
Spoke with Patty at [facility] and she said just to route note to her through [facility].  Will send over.

## 2015-10-26 NOTE — Telephone Encounter (Signed)
I agree with plan to have Lovenox overlap given her mechanical aortic valve.

## 2015-10-26 NOTE — Telephone Encounter (Signed)
Progress Notes      Leeroy Bock, RPH at 10/26/2015 4:24 PM     Status: Signed       Expand All Collapse All   Received verbal confirmation from Dr. Tamala Julian that pt ok to Lovenox bridge. Already coordinated in Coumadin clinic and instructions given to pt. Will route updated clearance.            Leeroy Bock, RPH at 10/26/2015 3:36 PM     Status: Signed       Expand All Collapse All   Patient has a history of mechanical AVR and afib, INR goal 2.5-3.5. Per our protocol, would bridge with Lovenox. Patient has done Lovenox injections previously. Are you ok with this Dr. Tamala Julian?            Loren Racer, LPN at [id number] PM     Status: Signed       Expand All Collapse All   Spoke with Anetta Olvera at [facility] and she said just to route note to her through [facility]. Will send over.            Belva Crome, MD at 10/26/2015 3:24 PM     Status: Signed       Expand All Collapse All   Hold Coumadin for 5 days prior to colonoscopy. Resume maintenance Coumadin therapy under the direction of the Coumadin clinic as soon as possible after the procedure.            Glyn Ade at 10/26/2015 2:32 PM     Status: Signed       Expand All Collapse All   Request for surgical clearance:  1. What type of surgery is being performed? Colonoscopy  2. When is this surgery scheduled? 11-01-15  3. Are there any medications that need to be held prior to surgery and how long?Can he stop Coumadin 5 days before-needs to stop it today -Need this before 5 today please  4. Name of physician performing surgery? Dr Owens Loffler  5. What is your office phone and fax number?[phone] and fax number is [phone] Att: Tammara Massing 6.              Incoming Call       Provider Department Encounter # Center    10/26/2015 2:30 PM Belva Crome, MD [facility] [id number]

## 2015-10-29 ENCOUNTER — Ambulatory Visit (INDEPENDENT_AMBULATORY_CARE_PROVIDER_SITE_OTHER): Payer: BLUE CROSS/BLUE SHIELD | Admitting: Family Medicine

## 2015-10-29 ENCOUNTER — Encounter: Payer: Self-pay | Admitting: Family Medicine

## 2015-10-29 ENCOUNTER — Telehealth: Payer: Self-pay | Admitting: Gastroenterology

## 2015-10-29 VITALS — BP 131/66 | HR 63 | Ht 68.0 in | Wt 255.0 lb

## 2015-10-29 DIAGNOSIS — N939 Abnormal uterine and vaginal bleeding, unspecified: Secondary | ICD-10-CM

## 2015-10-29 DIAGNOSIS — N898 Other specified noninflammatory disorders of vagina: Secondary | ICD-10-CM

## 2015-10-29 NOTE — Assessment & Plan Note (Signed)
Has required blood transfusion and I would prefer her to not have to go through that again. Remains on anti-coagulants and is currently holding her coumadin and using Lovenox in anticipation of scheduled colonoscopy. If we need to could return to the OR and give FFP again.

## 2015-10-29 NOTE — Patient Instructions (Signed)
Anemia, Nonspecific Anemia is a condition in which the concentration of red blood cells or hemoglobin in the blood is below normal. Hemoglobin is a substance in red blood cells that carries oxygen to the tissues of the body. Anemia results in not enough oxygen reaching these tissues.  CAUSES  Common causes of anemia include:   Excessive bleeding. Bleeding may be internal or external. This includes excessive bleeding from periods (in women) or from the intestine.   Poor nutrition.   Chronic kidney, thyroid, and liver disease.  Bone marrow disorders that decrease red blood cell production.  Cancer and treatments for cancer.  HIV, AIDS, and their treatments.  Spleen problems that increase red blood cell destruction.  Blood disorders.  Excess destruction of red blood cells due to infection, medicines, and autoimmune disorders. SIGNS AND SYMPTOMS   Minor weakness.   Dizziness.   Headache.  Palpitations.   Shortness of breath, especially with exercise.   Paleness.  Cold sensitivity.  Indigestion.  Nausea.  Difficulty sleeping.  Difficulty concentrating. Symptoms may occur suddenly or they may develop slowly.  DIAGNOSIS  Additional blood tests are often needed. These help your health care provider determine the best treatment. Your health care provider will check your stool for blood and look for other causes of blood loss.  TREATMENT  Treatment varies depending on the cause of the anemia. Treatment can include:   Supplements of iron, vitamin B12, or folic acid.   Hormone medicines.   A blood transfusion. This may be needed if blood loss is severe.   Hospitalization. This may be needed if there is significant continual blood loss.   Dietary changes.  Spleen removal. HOME CARE INSTRUCTIONS Keep all follow-up appointments. It often takes many weeks to correct anemia, and having your health care provider check on your condition and your response to  treatment is very important. SEEK IMMEDIATE MEDICAL CARE IF:   You develop extreme weakness, shortness of breath, or chest pain.   You become dizzy or have trouble concentrating.  You develop heavy vaginal bleeding.   You develop a rash.   You have bloody or black, tarry stools.   You faint.   You vomit up blood.   You vomit repeatedly.   You have abdominal pain.  You have a fever or persistent symptoms for more than 2-3 days.   You have a fever and your symptoms suddenly get worse.   You are dehydrated.  MAKE SURE YOU:  Understand these instructions.  Will watch your condition.  Will get help right away if you are not doing well or get worse.   This information is not intended to replace advice given to you by your health care provider. Make sure you discuss any questions you have with your health care provider.   Document Released: 06/19/2004 Document Revised: 01/12/2013 Document Reviewed: 11/05/2012 Elsevier Interactive Patient Education 2016 [employer]  

## 2015-10-29 NOTE — Progress Notes (Signed)
    Subjective:    Patient ID: [name] is a 65 y.o. female presenting with Follow-up  on 10/29/2015  HPI: Since surgery last week reports increasing pain and lethargy. Has not had bleeding, but noted some today. Pathology is reviewed and shows benign disease. Was having grey discharge, but has changed. Pain is low in the abdomen. CT shows inflammation at the vaginal cuff, but none of the bowel.  Review of Systems  Constitutional: Negative for fever and chills.  Respiratory: Negative for shortness of breath.   Cardiovascular: Negative for chest pain.  Gastrointestinal: Negative for nausea, vomiting and abdominal pain.  Genitourinary: Negative for dysuria.  Skin: Negative for rash.      Objective:    BP 131/66 mmHg  Pulse 63  Ht 5\' 8"  (1.727 m)  Wt 255 lb (115.667 kg)  BMI 38.78 kg/m2 Physical Exam  Constitutional: She is oriented to person, place, and time. She appears well-developed and well-nourished. No distress.  HENT:  Head: Normocephalic and atraumatic.  Eyes: No scleral icterus.  Neck: Neck supple.  Cardiovascular: Normal rate.   Pulmonary/Chest: Effort normal.  Abdominal: Soft.  Genitourinary:  Vagina with escar at top of cuff, probably from the previous surgery and cautery. There is some dark red blood oozing from the area. All areas are treated with Monsel's solution and hemostasis obtained.  Neurological: She is alert and oriented to person, place, and time.  Skin: Skin is warm and dry.  Psychiatric: She has a normal mood and affect.        Assessment & Plan:   Problem List Items Addressed This Visit      Unprioritized   Vaginal lesion    Despite biopsy showing no cancer, I am unclear as to the etiology of this ulcer and why she continues to have bleeding. I have instructed her to call me with any visits to the ED/MAU for bleeding until she is able to get in with GYN/ONC. She has colonoscopy scheduled for Thursday. If there is no infectious or cancerous  etiology found, I am at a loss as to further diagnostic work-up. I have referred her GYN/ONC who will see her next Wednesday.      Relevant Orders   Ambulatory referral to Gynecologic Oncology   Vaginal bleeding - Primary    Has required blood transfusion and I would prefer her to not have to go through that again. Remains on anti-coagulants and is currently holding her coumadin and using Lovenox in anticipation of scheduled colonoscopy. If we need to could return to the OR and give FFP again.        Return if symptoms worsen or fail to improve.   [name] S 10/29/2015 10:33 AM

## 2015-10-29 NOTE — Assessment & Plan Note (Signed)
Despite biopsy showing no cancer, I am unclear as to the etiology of this ulcer and why she continues to have bleeding. I have instructed her to call me with any visits to the ED/MAU for bleeding until she is able to get in with GYN/ONC. She has colonoscopy scheduled for Thursday. If there is no infectious or cancerous etiology found, I am at a loss as to further diagnostic work-up. I have referred her GYN/ONC who will see her next Wednesday.

## 2015-10-29 NOTE — Telephone Encounter (Signed)
Patient advised that we have placed a Suprep sample to the front desk for her to pick up. She verbalizes understanding.

## 2015-10-30 ENCOUNTER — Other Ambulatory Visit (INDEPENDENT_AMBULATORY_CARE_PROVIDER_SITE_OTHER): Payer: BLUE CROSS/BLUE SHIELD

## 2015-10-30 DIAGNOSIS — N939 Abnormal uterine and vaginal bleeding, unspecified: Secondary | ICD-10-CM

## 2015-10-30 LAB — CBC WITH DIFFERENTIAL/PLATELET
Basophils Absolute: 0.1 10*3/uL (ref 0.0–0.1)
Basophils Relative: 1.3 % (ref 0.0–3.0)
Eosinophils Absolute: 0.2 10*3/uL (ref 0.0–0.7)
Eosinophils Relative: 2.5 % (ref 0.0–5.0)
HCT: 31.6 % — ABNORMAL LOW (ref 36.0–46.0)
Hemoglobin: 10.5 g/dL — ABNORMAL LOW (ref 12.0–15.0)
Lymphocytes Relative: 27.9 % (ref 12.0–46.0)
Lymphs Abs: 1.8 10*3/uL (ref 0.7–4.0)
MCHC: 33.2 g/dL (ref 30.0–36.0)
MCV: 88.5 fl (ref 78.0–100.0)
Monocytes Absolute: 0.6 10*3/uL (ref 0.1–1.0)
Monocytes Relative: 9.7 % (ref 3.0–12.0)
Neutro Abs: 3.7 10*3/uL (ref 1.4–7.7)
Neutrophils Relative %: 58.6 % (ref 43.0–77.0)
Platelets: 272 10*3/uL (ref 150.0–400.0)
RBC: 3.57 Mil/uL — ABNORMAL LOW (ref 3.87–5.11)
RDW: 16 % — ABNORMAL HIGH (ref 11.5–15.5)
WBC: 6.3 10*3/uL (ref 4.0–10.5)

## 2015-11-01 ENCOUNTER — Encounter (HOSPITAL_COMMUNITY)
Admission: RE | Disposition: A | Discharge status: Home / Self Care | Payer: Self-pay | Source: Ambulatory Visit | Attending: Gastroenterology

## 2015-11-01 ENCOUNTER — Ambulatory Visit (HOSPITAL_COMMUNITY)
Admission: RE | Admit: 2015-11-01 | Discharge: 2015-11-01 | Disposition: A | Discharge status: Home / Self Care | Payer: BLUE CROSS/BLUE SHIELD | Source: Ambulatory Visit | Attending: Gastroenterology | Admitting: Gastroenterology

## 2015-11-01 ENCOUNTER — Ambulatory Visit (HOSPITAL_COMMUNITY): Payer: BLUE CROSS/BLUE SHIELD | Admitting: Anesthesiology

## 2015-11-01 ENCOUNTER — Encounter (HOSPITAL_COMMUNITY): Payer: Self-pay

## 2015-11-01 DIAGNOSIS — D124 Benign neoplasm of descending colon: Secondary | ICD-10-CM | POA: Diagnosis not present

## 2015-11-01 DIAGNOSIS — F419 Anxiety disorder, unspecified: Secondary | ICD-10-CM | POA: Insufficient documentation

## 2015-11-01 DIAGNOSIS — K5730 Diverticulosis of large intestine without perforation or abscess without bleeding: Secondary | ICD-10-CM | POA: Insufficient documentation

## 2015-11-01 DIAGNOSIS — Z79899 Other long term (current) drug therapy: Secondary | ICD-10-CM | POA: Insufficient documentation

## 2015-11-01 DIAGNOSIS — Z7982 Long term (current) use of aspirin: Secondary | ICD-10-CM | POA: Diagnosis not present

## 2015-11-01 DIAGNOSIS — F319 Bipolar disorder, unspecified: Secondary | ICD-10-CM | POA: Diagnosis not present

## 2015-11-01 DIAGNOSIS — M1990 Unspecified osteoarthritis, unspecified site: Secondary | ICD-10-CM | POA: Diagnosis not present

## 2015-11-01 DIAGNOSIS — Z1211 Encounter for screening for malignant neoplasm of colon: Secondary | ICD-10-CM | POA: Diagnosis not present

## 2015-11-01 DIAGNOSIS — Z87891 Personal history of nicotine dependence: Secondary | ICD-10-CM | POA: Diagnosis not present

## 2015-11-01 DIAGNOSIS — Z7901 Long term (current) use of anticoagulants: Secondary | ICD-10-CM | POA: Diagnosis not present

## 2015-11-01 DIAGNOSIS — I10 Essential (primary) hypertension: Secondary | ICD-10-CM | POA: Diagnosis not present

## 2015-11-01 DIAGNOSIS — D123 Benign neoplasm of transverse colon: Secondary | ICD-10-CM | POA: Insufficient documentation

## 2015-11-01 DIAGNOSIS — E782 Mixed hyperlipidemia: Secondary | ICD-10-CM | POA: Insufficient documentation

## 2015-11-01 DIAGNOSIS — D122 Benign neoplasm of ascending colon: Secondary | ICD-10-CM | POA: Diagnosis not present

## 2015-11-01 DIAGNOSIS — R9389 Abnormal findings on diagnostic imaging of other specified body structures: Secondary | ICD-10-CM

## 2015-11-01 DIAGNOSIS — Z6838 Body mass index (BMI) 38.0-38.9, adult: Secondary | ICD-10-CM | POA: Diagnosis not present

## 2015-11-01 DIAGNOSIS — E010 Iodine-deficiency related diffuse (endemic) goiter: Secondary | ICD-10-CM | POA: Diagnosis not present

## 2015-11-01 DIAGNOSIS — Z952 Presence of prosthetic heart valve: Secondary | ICD-10-CM | POA: Diagnosis not present

## 2015-11-01 HISTORY — PX: COLONOSCOPY WITH PROPOFOL: SHX5780

## 2015-11-01 SURGERY — COLONOSCOPY WITH PROPOFOL
Anesthesia: General

## 2015-11-01 MED ORDER — LACTATED RINGERS IV SOLN
INTRAVENOUS | Status: DC | PRN
  Administered 2015-11-01: 08:00:00 via INTRAVENOUS

## 2015-11-01 MED ORDER — ONDANSETRON HCL 4 MG/2ML IJ SOLN
INTRAMUSCULAR | Status: DC | PRN
  Administered 2015-11-01: 4 mg via INTRAVENOUS

## 2015-11-01 MED ORDER — MIDAZOLAM HCL 2 MG/2ML IJ SOLN
INTRAMUSCULAR | Status: AC
  Filled 2015-11-01: qty 2

## 2015-11-01 MED ORDER — SODIUM CHLORIDE 0.9 % IV SOLN: INTRAVENOUS | Status: DC

## 2015-11-01 MED ORDER — PROPOFOL 10 MG/ML IV BOLUS
INTRAVENOUS | Status: AC
  Filled 2015-11-01: qty 40

## 2015-11-01 MED ORDER — FENTANYL CITRATE (PF) 100 MCG/2ML IJ SOLN
INTRAMUSCULAR | Status: AC
  Filled 2015-11-01: qty 2

## 2015-11-01 MED ORDER — FENTANYL CITRATE (PF) 100 MCG/2ML IJ SOLN
INTRAMUSCULAR | Status: DC | PRN
  Administered 2015-11-01 (×2): 25 ug via INTRAVENOUS

## 2015-11-01 MED ORDER — MIDAZOLAM HCL 5 MG/5ML IJ SOLN
INTRAMUSCULAR | Status: DC | PRN
  Administered 2015-11-01: 1 mg via INTRAVENOUS

## 2015-11-01 MED ORDER — LIDOCAINE HCL (CARDIAC) 20 MG/ML IV SOLN
INTRAVENOUS | Status: AC
  Filled 2015-11-01: qty 5

## 2015-11-01 MED ORDER — ONDANSETRON HCL 4 MG/2ML IJ SOLN
INTRAMUSCULAR | Status: AC
  Filled 2015-11-01: qty 2

## 2015-11-01 MED ORDER — LIDOCAINE HCL (PF) 2 % IJ SOLN
INTRAMUSCULAR | Status: DC | PRN
  Administered 2015-11-01: 75 mg via INTRADERMAL

## 2015-11-01 MED ORDER — PROPOFOL 10 MG/ML IV BOLUS
INTRAVENOUS | Status: DC | PRN
  Administered 2015-11-01: 175 mg via INTRAVENOUS

## 2015-11-01 SURGICAL SUPPLY — 21 items

## 2015-11-01 NOTE — Op Note (Signed)
[facility] Patient Name: [name] Procedure Date: 11/01/2015 MRN: [id number] Attending MD: Milus Banister , MD Date of Birth: 03-17-1951 CSN: [id number] Age: 65 Admit Type: Outpatient Procedure:                Colonoscopy Indications:              Screening for colorectal malignant neoplasm; recent                            severe vaginal bleeding, vaginal ulcer noted                            (biopsies negative for malignancy), ? underlying                            lesion from GI tract Providers:                Milus Banister, MD, Laverta Baltimore, RN, Alfonso Patten, Technician, [address]. Zenia Resides CRNA, CRNA Referring MD:              Medicines:                Monitored Anesthesia Care Complications:            No immediate complications. Estimated blood loss:                            None. Estimated Blood Loss:     Estimated blood loss: none. Procedure:                Pre-Anesthesia Assessment:                           - Prior to the procedure, a History and Physical                            was performed, and patient medications and                            allergies were reviewed. The patient's tolerance of                            previous anesthesia was also reviewed. The risks                            and benefits of the procedure and the sedation                            options and risks were discussed with the patient.                            All questions were answered, and informed consent                            was  obtained. Prior Anticoagulants: The patient has                            taken Coumadin (warfarin), last dose was 5 days                            prior to procedure. ASA Grade Assessment: III - A                            patient with severe systemic disease. After                            reviewing the risks and benefits, the patient was                            deemed in satisfactory  condition to undergo the                            procedure.                           After obtaining informed consent, the colonoscope                            was passed under direct vision. Throughout the                            procedure, the patient's blood pressure, pulse, and                            oxygen saturations were monitored continuously. The                            EC-[id number] scope was introduced through                            the anus and advanced to the the cecum, identified                            by appendiceal orifice and ileocecal valve. The                            colonoscopy was performed without difficulty. The                            patient tolerated the procedure well. The quality                            of the bowel preparation was excellent. The                            ileocecal valve, appendiceal orifice, and rectum  were photographed. Scope In: 8:40:48 AM Scope Out: 9:04:39 AM Scope Withdrawal Time: 0 hours 15 minutes 59 seconds  Total Procedure Duration: 0 hours 23 minutes 51 seconds  Findings:      Four sessile polyps were found in the descending colon, transverse colon       and ascending colon. The polyps were 4 to 6 mm in size. These polyps       were removed with a cold snare. Resection and retrieval were complete.      Multiple small and large-mouthed diverticula were found in the entire       colon. This was most significant in the left colon where there was       edema, tortuosity of the lumen.      The exam was otherwise without abnormality on direct and retroflexion       views. Impression:               - Four 4 to 6 mm polyps in the descending colon, in                            the transverse colon and in the ascending colon,                            removed with a cold snare. Resected and retrieved.                           - Diverticulosis in the entire examined colon,  most                            significant in the left colon where the colon was                            turtuous, thickened.                           - The examination was otherwise normal on direct                            and retroflexion views. Moderate Sedation:      N/A- Per Anesthesia Care Recommendation:           - Patient has a contact number available for                            emergencies. The signs and symptoms of potential                            delayed complications were discussed with the                            patient. Return to normal activities tomorrow.                            Written discharge instructions were provided to the  patient.                           - Resume previous diet.                           - Continue present medications.                           You will receive a letter within 2-3 weeks with the                            pathology results and my final recommendations.                           If the polyp(s) is proven to be 'pre-cancerous' on                            pathology, you will need repeat colonoscopy in 3                            years. If the polyp(s) is NOT 'precancerous' on                            pathology then you should repeat colon cancer                            screening in 10 years with colonoscopy without need                            for colon cancer screening by any method prior to                            then (including stool testing). Procedure Code(s):        --- Professional ---                           [phone], Colonoscopy, flexible; with removal of                            tumor(s), polyp(s), or other lesion(s) by snare                            technique Diagnosis Code(s):        --- Professional ---                           Z12.11, Encounter for screening for malignant                            neoplasm of colon                           D12.4,  Benign neoplasm of descending colon  D12.3, Benign neoplasm of transverse colon (hepatic                            flexure or splenic flexure)                           D12.2, Benign neoplasm of ascending colon                           K57.30, Diverticulosis of large intestine without                            perforation or abscess without bleeding CPT copyright 2016 [employer]. All rights reserved. The codes documented in this report are preliminary and upon coder review may  be revised to meet current compliance requirements. Milus Banister, MD 11/01/2015 9:14:53 AM This report has been signed electronically. Number of Addenda: 0

## 2015-11-01 NOTE — Anesthesia Preprocedure Evaluation (Addendum)
Anesthesia Evaluation  Patient identified by MRN, date of birth, ID band Patient awake    Reviewed: Allergy & Precautions, H&P , NPO status , Patient's Chart, lab work & pertinent test results  Airway Mallampati: II  TM Distance: >3 FB Neck ROM: full    Dental no notable dental hx. (+) Teeth Intact   Pulmonary former smoker,    Pulmonary exam normal        Cardiovascular hypertension, Pt. on medications Normal cardiovascular exam     Neuro/Psych negative neurological ROS     GI/Hepatic negative GI ROS, Neg liver ROS,   Endo/Other  Morbid obesity  Renal/GU      Musculoskeletal   Abdominal (+) + obese,   Peds  Hematology   Anesthesia Other Findings   Reproductive/Obstetrics negative OB ROS                            Anesthesia Physical  Anesthesia Plan  ASA: III  Anesthesia Plan: General   Post-op Pain Management:    Induction: Intravenous  Airway Management Planned: Natural Airway and LMA  Additional Equipment:   Intra-op Plan:   Post-operative Plan:   Informed Consent: I have reviewed the patients History and Physical, chart, labs and discussed the procedure including the risks, benefits and alternatives for the proposed anesthesia with the patient or authorized representative who has indicated his/her understanding and acceptance.   Dental Advisory Given  Plan Discussed with: CRNA and Surgeon  Anesthesia Plan Comments:        Anesthesia Quick Evaluation                                  Anesthesia Evaluation  Patient identified by MRN, date of birth, ID band Patient awake    Reviewed: Allergy & Precautions, H&P , NPO status , Patient's Chart, lab work & pertinent test results  Airway Mallampati: II  TM Distance: >3 FB Neck ROM: full    Dental no notable dental hx. (+) Teeth Intact   Pulmonary former smoker,    Pulmonary exam normal        Cardiovascular hypertension, Pt. on medications Normal cardiovascular exam     Neuro/Psych negative neurological ROS     GI/Hepatic negative GI ROS, Neg liver ROS,   Endo/Other  Morbid obesity  Renal/GU      Musculoskeletal   Abdominal (+) + obese,   Peds  Hematology   Anesthesia Other Findings   Reproductive/Obstetrics negative OB ROS                            Anesthesia Physical Anesthesia Plan  ASA: III  Anesthesia Plan: General   Post-op Pain Management:    Induction: Intravenous  Airway Management Planned: LMA  Additional Equipment:   Intra-op Plan:   Post-operative Plan:   Informed Consent: I have reviewed the patients History and Physical, chart, labs and discussed the procedure including the risks, benefits and alternatives for the proposed anesthesia with the patient or authorized representative who has indicated his/her understanding and acceptance.   Dental Advisory Given  Plan Discussed with: CRNA and Surgeon  Anesthesia Plan Comments:         Anesthesia Quick Evaluation

## 2015-11-01 NOTE — Discharge Instructions (Signed)

## 2015-11-01 NOTE — Anesthesia Procedure Notes (Signed)
Procedure Name: LMA Insertion Date/Time: 11/01/2015 8:31 AM Performed by: Lollie Sails Pre-anesthesia Checklist: Patient identified, Emergency Drugs available, Suction available, Patient being monitored and Timeout performed Patient Re-evaluated:Patient Re-evaluated prior to inductionOxygen Delivery Method: Circle system utilized Preoxygenation: Pre-oxygenation with 100% oxygen Intubation Type: IV induction Ventilation: Mask ventilation without difficulty LMA: LMA inserted LMA Size: 4.0 Number of attempts: 2 Placement Confirmation: positive ETCO2 and breath sounds checked- equal and bilateral Tube secured with: Tape Dental Injury: Teeth and Oropharynx as per pre-operative assessment

## 2015-11-01 NOTE — Anesthesia Postprocedure Evaluation (Signed)
Anesthesia Post Note  Patient: [name]  Procedure(s) Performed: Procedure(s) (LRB): COLONOSCOPY WITH PROPOFOL (N/A)  Patient location during evaluation: PACU Anesthesia Type: General Level of consciousness: sedated Pain management: satisfactory to patient Vital Signs Assessment: post-procedure vital signs reviewed and stable Respiratory status: spontaneous breathing Cardiovascular status: stable Anesthetic complications: no    Last Vitals:  Filed Vitals:   11/01/15 0920 11/01/15 0930  BP: 155/54 151/55  Pulse: 57 59  Temp:    Resp: 13 17    Last Pain: There were no vitals filed for this visit.               Riccardo Dubin

## 2015-11-01 NOTE — H&P (View-Only) (Signed)
[name] is an 65 y.o. G51P2002 female.   Chief Complaint: vaginal bleeding HPI: Patient presents for the fifth time with significant vaginal bleeding. So much today, that she was unable to walk and is quite fatigued. Per her daughter, she went through a box of chuck pads. She has been treated with AgNO3, Monsel's, Gel Foam, packing and though all have worked for a short period of time, her bleeding has returned and been quite brisk. She has a complicated medical history that includes aortic valve repair and lifelong Coumadin. She has been therapeutic on her INR.  Past Medical History  Diagnosis Date  . Anemia   . Anxiety   . Depression   . Diverticulosis   . Elevated cholesterol   . Obesity   . THYROMEGALY 07/22/2010  . Overweight(278.02) 03/11/2010  . Mixed hyperlipidemia 03/11/2010  . MEASLES, HX OF 03/11/2010  . KNEE PAIN, RIGHT 05/28/2010  . HYPERTRIGLYCERIDEMIA 03/11/2010  . HEART MURMUR, HX OF 03/11/2010  . FIBROIDS, UTERUS 03/11/2010  . Diarrhea 08/05/2010  . CONSTIPATION 03/11/2010  . CHICKENPOX, HX OF 03/11/2010  . BIPOLAR AFFECTIVE DISORDER 03/11/2010  . AORTIC VALVE REPLACEMENT, HX OF 03/11/2010  . ANEMIA 05/28/2010  . Acute bronchitis 07/22/2010  . Abdominal pain, generalized 03/19/2010  . Peripheral edema 11/18/2010  . Bacterial vaginitis 11/18/2010  . Hematuria 12/18/2010  . Diverticulitis 12/18/2010  . Hyperthyroidism 12/18/2010  . Fatigue 12/30/2010  . Renal insufficiency 12/30/2010  . Grave's disease 8-12  . Arm lesion 03/23/2011  . UTI (lower urinary tract infection) 08/14/2011  . Breast pain in female 11/17/2011  . Chest pain 11/17/2011  . Hematoma 12/18/2011  . Cervical cancer screening 03/22/2012  . Foot pain, left 03/22/2012  . Foot fracture, left 03/22/2012  . Tick bite of back 10/15/2012  . Preop examination 02/03/2013  . Dysrhythmia   . Hypertension   . Shortness of breath     with exertion   . Heart murmur   . Urinary tract bacterial infections   .  Arthritis     Past Surgical History  Procedure Laterality Date  . Appendectomy      Required revision for EColi infection, required recurrent packing  . Bowel obstruction      Requiring adhesions to be removed  . Aortic valve replacement    . Open heart with a cardiac aneurysm repair    . Cholecystectomy    . Tubal ligation    . Varicose vein surgery b/l legs    . Childhood exploratory surgery of heart    . Hernia repair  08-16-10  . Abdominal hysterectomy  ?1998    sb/l spo, total for fibroids/heavy bleeding  . Total knee arthroplasty Right 02/24/2013    Procedure: RIGHT TOTAL KNEE ARTHROPLASTY;  Surgeon: Johnn Hai, MD;  Location: [facility];  Service: Orthopedics;  Laterality: Right;  . Left heart catheterization with coronary angiogram N/A 12/05/2011    Procedure: LEFT HEART CATHETERIZATION WITH CORONARY ANGIOGRAM;  Surgeon: Sinclair Grooms, MD;  Location: [facility] CATH LAB;  Service: Cardiovascular;  Laterality: N/A;    Family History  Problem Relation Age of Onset  . Scoliosis Mother   . Arthritis Mother     Rheumatoid  . Aneurysm Mother     heart  . Alcohol abuse Father   . Depression Sister   . Depression Brother   . Alcohol abuse Brother   . Cancer Maternal Grandmother     colon  . Diabetes Maternal Grandfather   . Heart  disease Maternal Grandfather   . Alcohol abuse Maternal Grandfather   . Depression Paternal Grandmother   . Diabetes Paternal Grandmother   . Depression Paternal Grandfather   . Diabetes Paternal Grandfather   . Depression Sister   . Alcohol abuse Brother   . Depression Brother   . Heart disease Brother    Social History:  reports that she quit smoking about 25 years ago. She has never used smokeless tobacco. She reports that she drinks alcohol. She reports that she does not use illicit drugs.  Allergies:  Allergies  Allergen Reactions  . Sulfonamide Derivatives     headaches    No current facility-administered medications on file prior to  encounter.   Current Outpatient Prescriptions on File Prior to Encounter  Medication Sig Dispense Refill  . aspirin EC 81 MG tablet Take 81 mg by mouth daily.    . clonazePAM (KLONOPIN) 0.5 MG tablet Take 0.5 mg by mouth 2 (two) times daily.     . Estradiol 10 MCG TABS vaginal tablet One tablet in vagina daily for 2 weeks then 3 times a week for total of 8 weeks 30 tablet 2  . furosemide (LASIX) 40 MG tablet Take 1 tablet (40 mg total) by mouth daily. 30 tablet 11  . lamoTRIgine (LAMICTAL) 200 MG tablet Take 200 mg by mouth at bedtime.    . metoprolol succinate (TOPROL-XL) 25 MG 24 hr tablet TAKE 1 TABLET (25 MG TOTAL) BY MOUTH EVERY MORNING. 30 tablet 4  . pantoprazole (PROTONIX) 40 MG tablet TAKE 1 TABLET (40 MG TOTAL) BY MOUTH DAILY. 30 tablet 5  . rosuvastatin (CRESTOR) 40 MG tablet TAKE 1 TABLET (40 MG TOTAL) BY MOUTH DAILY. 30 tablet 6  . SEROQUEL XR 400 MG 24 hr tablet Take 400 mg by mouth at bedtime.   1  . warfarin (COUMADIN) 5 MG tablet TAKE AS DIRECTED BY ANTICOAGULATION CLINIC (Patient taking differently: Take 2.5mg  on Tuesday and Saturday, all other days take 5mg .) 35 tablet 2  . albuterol (PROVENTIL HFA;VENTOLIN HFA) 108 (90 BASE) MCG/ACT inhaler Inhale 2 puffs into the lungs every 6 (six) hours as needed for wheezing or shortness of breath. 1 Inhaler 0  . glucose blood test strip Use once daily to check blood sugar.  DX E11.9 100 each 3  . ONE TOUCH LANCETS MISC Test once daily to check blood sugar. DX E11.9 100 each 3  . [DISCONTINUED] methimazole (TAPAZOLE) 5 MG tablet Take 1 tablet (5 mg total) by mouth 3 (three) times daily. 30 tablet 2    Pertinent items are noted in HPI.  Blood pressure 115/60, pulse 71, temperature 97.4 F (36.3 C), temperature source Oral, resp. rate 18. General appearance: alert, cooperative, appears stated age, moderately obese and pale Lungs: normal effort Heart: regular rate and rhythm and SEM noted Abdomen: soft, non-tender; bowel sounds normal;  no masses,  no organomegaly Pelvic: external genitalia normal, uterus surgically absent and frank bleeding noted and covering legs and perineum. Large clot removed from introitus. small 1.5 cm clot adherent to the vagina with ongoing bleeding noted. Some brown discoloration of vagina noted. Skin: Skin color, texture, turgor normal. No rashes or lesions Neurologic: Grossly normal   CBC    Component Value Date/Time   WBC 7.4 10/16/2015 1056   WBC 4.4 06/16/2011 0953   RBC 2.65* 10/16/2015 1056   RBC 4.11 06/16/2011 0953   RBC 3.35* 02/20/2011 0820   HGB 7.8* 10/16/2015 1056   HGB 12.0 06/16/2011 0953  HCT 23.7* 10/16/2015 1056   HCT 35.6 06/16/2011 0953   PLT 191 10/16/2015 1056   PLT 233 06/16/2011 0953   MCV 89.4 10/16/2015 1056   MCV 86.6 06/16/2011 0953   MCH 29.4 10/16/2015 1056   MCH 29.2 06/16/2011 0953   MCHC 32.9 10/16/2015 1056   MCHC 33.7 06/16/2011 0953   RDW 14.8 10/16/2015 1056   RDW 14.1 06/16/2011 0953   LYMPHSABS 2.5 09/11/2015 2228   LYMPHSABS 1.7 06/16/2011 0953   MONOABS 0.6 09/11/2015 2228   MONOABS 0.3 06/16/2011 0953   EOSABS 0.2 09/11/2015 2228   EOSABS 0.2 06/16/2011 0953   BASOSABS 0.1 09/11/2015 2228   BASOSABS 0.1 06/16/2011 0953    INR 2.8 1 wk ago  Assessment/Plan Principal Problem:   Abnormal vaginal bleeding Active Problems:   Acute blood loss anemia   Vaginal lesion  To the OR for exam under anesthesia, improved biopsy and control of bleeding. Will likely need some blood given her heart, age and co-morbidities. Risks include but are not limited to bleeding, infection, injury to surrounding structures. Likelihood of success is moderate.    Deavion Dobbs S 10/16/2015, 11:06 AM

## 2015-11-01 NOTE — Transfer of Care (Signed)
Immediate Anesthesia Transfer of Care Note  Patient: [name]  Procedure(s) Performed: Procedure(s): COLONOSCOPY WITH PROPOFOL (N/A)  Patient Location: PACU and Endoscopy Unit  Anesthesia Type:General  Level of Consciousness: awake, alert , oriented and patient cooperative  Airway & Oxygen Therapy: Patient Spontanous Breathing and Patient connected to face mask oxygen  Post-op Assessment: Report given to RN and Post -op Vital signs reviewed and stable  Post vital signs: Reviewed and stable  Last Vitals:  Filed Vitals:   11/01/15 0740 11/01/15 0908  BP: 188/70 169/58  Pulse:    Temp:  36.4 C  Resp:  15    Last Pain: There were no vitals filed for this visit.       Complications: No apparent anesthesia complications

## 2015-11-01 NOTE — Interval H&P Note (Signed)
History and Physical Interval Note:  11/01/2015 8:09 AM  [name]  has presented today for surgery, with the diagnosis of question colon lesion, vaginal bleeding,   The various methods of treatment have been discussed with the patient and family. After consideration of risks, benefits and other options for treatment, the patient has consented to  Procedure(s): COLONOSCOPY WITH PROPOFOL (N/A) as a surgical intervention .  The patient's history has been reviewed, patient examined, no change in status, stable for surgery.  I have reviewed the patient's chart and labs.  Questions were answered to the patient's satisfaction.     Milus Banister

## 2015-11-02 ENCOUNTER — Encounter (HOSPITAL_COMMUNITY): Payer: Self-pay | Admitting: Gastroenterology

## 2015-11-06 ENCOUNTER — Ambulatory Visit (INDEPENDENT_AMBULATORY_CARE_PROVIDER_SITE_OTHER): Payer: BLUE CROSS/BLUE SHIELD | Admitting: *Deleted

## 2015-11-06 DIAGNOSIS — Z952 Presence of prosthetic heart valve: Secondary | ICD-10-CM

## 2015-11-06 DIAGNOSIS — Z5181 Encounter for therapeutic drug level monitoring: Secondary | ICD-10-CM

## 2015-11-06 DIAGNOSIS — Z954 Presence of other heart-valve replacement: Secondary | ICD-10-CM | POA: Diagnosis not present

## 2015-11-06 DIAGNOSIS — I4891 Unspecified atrial fibrillation: Secondary | ICD-10-CM

## 2015-11-06 LAB — POCT INR: INR: 1.7

## 2015-11-06 MED ORDER — ENOXAPARIN SODIUM 120 MG/0.8ML ~~LOC~~ SOLN: 120.0000 mg | Freq: Two times a day (BID) | SUBCUTANEOUS | Status: DC

## 2015-11-07 ENCOUNTER — Telehealth: Payer: Self-pay | Admitting: Family Medicine

## 2015-11-07 ENCOUNTER — Encounter: Payer: Self-pay | Admitting: Gynecologic Oncology

## 2015-11-07 ENCOUNTER — Ambulatory Visit: Payer: BLUE CROSS/BLUE SHIELD | Attending: Gynecologic Oncology | Admitting: Gynecologic Oncology

## 2015-11-07 VITALS — BP 140/68 | HR 78 | Temp 98.3°F | Resp 18 | Ht 68.0 in | Wt 253.1 lb

## 2015-11-07 DIAGNOSIS — I110 Hypertensive heart disease with heart failure: Secondary | ICD-10-CM | POA: Diagnosis not present

## 2015-11-07 DIAGNOSIS — N765 Ulceration of vagina: Secondary | ICD-10-CM | POA: Insufficient documentation

## 2015-11-07 DIAGNOSIS — F319 Bipolar disorder, unspecified: Secondary | ICD-10-CM | POA: Diagnosis not present

## 2015-11-07 DIAGNOSIS — D649 Anemia, unspecified: Secondary | ICD-10-CM | POA: Insufficient documentation

## 2015-11-07 DIAGNOSIS — M1990 Unspecified osteoarthritis, unspecified site: Secondary | ICD-10-CM | POA: Diagnosis not present

## 2015-11-07 DIAGNOSIS — Z87891 Personal history of nicotine dependence: Secondary | ICD-10-CM | POA: Diagnosis not present

## 2015-11-07 DIAGNOSIS — I4891 Unspecified atrial fibrillation: Secondary | ICD-10-CM | POA: Insufficient documentation

## 2015-11-07 DIAGNOSIS — E119 Type 2 diabetes mellitus without complications: Secondary | ICD-10-CM | POA: Insufficient documentation

## 2015-11-07 DIAGNOSIS — Z90710 Acquired absence of both cervix and uterus: Secondary | ICD-10-CM | POA: Insufficient documentation

## 2015-11-07 DIAGNOSIS — Z7901 Long term (current) use of anticoagulants: Secondary | ICD-10-CM | POA: Insufficient documentation

## 2015-11-07 DIAGNOSIS — K5790 Diverticulosis of intestine, part unspecified, without perforation or abscess without bleeding: Secondary | ICD-10-CM | POA: Insufficient documentation

## 2015-11-07 DIAGNOSIS — E669 Obesity, unspecified: Secondary | ICD-10-CM | POA: Diagnosis not present

## 2015-11-07 DIAGNOSIS — I509 Heart failure, unspecified: Secondary | ICD-10-CM | POA: Insufficient documentation

## 2015-11-07 DIAGNOSIS — Z7982 Long term (current) use of aspirin: Secondary | ICD-10-CM | POA: Diagnosis not present

## 2015-11-07 DIAGNOSIS — N898 Other specified noninflammatory disorders of vagina: Secondary | ICD-10-CM

## 2015-11-07 DIAGNOSIS — E039 Hypothyroidism, unspecified: Secondary | ICD-10-CM | POA: Diagnosis not present

## 2015-11-07 DIAGNOSIS — E0500 Thyrotoxicosis with diffuse goiter without thyrotoxic crisis or storm: Secondary | ICD-10-CM | POA: Diagnosis not present

## 2015-11-07 DIAGNOSIS — E782 Mixed hyperlipidemia: Secondary | ICD-10-CM | POA: Insufficient documentation

## 2015-11-07 DIAGNOSIS — N939 Abnormal uterine and vaginal bleeding, unspecified: Secondary | ICD-10-CM | POA: Insufficient documentation

## 2015-11-07 DIAGNOSIS — F419 Anxiety disorder, unspecified: Secondary | ICD-10-CM | POA: Diagnosis not present

## 2015-11-07 DIAGNOSIS — Z952 Presence of prosthetic heart valve: Secondary | ICD-10-CM | POA: Insufficient documentation

## 2015-11-07 NOTE — Progress Notes (Signed)
Consult Note: Gyn-Onc  Consult was requested by Dr. Kennon Rounds for the evaluation of [name] 65 y.o. female  CC:  Chief Complaint  Patient presents with  . Vaginal Ulcer    New Consultation    Assessment/Plan:  [name]  is a 65 y.o.  year old with a bleeding vaginal ulcer. It does not appear to be consistent with vaginal cancer or a pelvic cancer eroding into the vagina. We will follow-up today's repeat biopsy and HPV testing.  I discussed with [name] and her daughter that I do not have a good solution for her vaginal bleeding given that it is from benign sources. I recommend continuing estrogen and maximizing this to optimize vascularization and healing of vaginal mucosa. I discussed that so long as she is therapeutically anticoagulated she will continue to bleed. However, if the bleeding is nuisance spotting but does not require repeated transfusions, this is preferable to the toxicities of alternative therapies such as surgery. Surgery (upper vaginectomy) would likely require laparotomy, bowel resection, lysis of adhesions, and in this patient with substantial cardiac issues, mechanical valve and need for anticoagulation, this would be a particularly hazardous surgery. Additionally, it would create a new suture line at the cuff which would also experience the same issues with bleeding and delayed healing that she is currently experiencing, and therefore longterm she may not benefit at all from such a radical intervention. Therefore I do not recommend surgery.  I considered vaginal brachytherapy and discussed this with my radiation oncology colleague who felt that it would be unlikely to be helpful in a patient without malignancy.  In the absence of a better option, I recommend continued vaginal estrogen usage and optimization of dosing.   HPI: [name] is a 65 year old parous woman with multiple major medical comorbidities including a history of congenital heart valve  abnormality and a mechanical valve on anticoagulation who is seen in consultation at the request of Dr Kennon Rounds for bleeding vaginal ulcers.  The patient has a history of developing brisk vaginal bleeding in May 2017. She was seen at the [facility] in [place] where she was taken to the operating room by Dr. Kennon Rounds on 10/16/2015. Intraoperative findings included the identification of vaginal ulceration. These ulcerated sites were biopsied. The biopsy revealed granulation type tissue and fibrous material but benign squamous mucosa. There is no evidence of malignancy.  At that if this admission the patient required 2 units of blood transfusion for her severe symptomatic anemia. However after applications of Monsel solution in the operating room and blood transfusion she required no other blood transfusion as had minimal leading since that time. She does however continue to have vaginal spotting. A CT scan was performed on 10/16/2015. It revealed scattered diverticulosis. There is diffuse soft tissue inflammation noted around the vagina, so this may be postoperative, though acute infection is a concern. No suspicious adnexal masses were seen. No infiltrating masses in the pelvis were identified.  The patient has a significant medical history for a mechanical heart valve which was placed in 2009. She takes Coumadin for this with the goal of an INR being between 2.5 and 3.5. She has associated CHF and atrial fibrillation. She is asthma, borderline diabetes, hypertension, hypothyroidism, diverticulosis. The patient has had 2 umbilical hernia repairs with mesh. She's also had an open appendectomy, and a total hysterectomy performed 25 years ago for benign dictations (fibroid uterus and menorrhagia). She reports a possible diagnosis of HPV infection on  a Pap smear in 2008.  Current Meds:  Outpatient Encounter Prescriptions as of 11/07/2015  Medication Sig  . albuterol (PROVENTIL HFA;VENTOLIN HFA) 108 (90 BASE)  MCG/ACT inhaler Inhale 2 puffs into the lungs every 6 (six) hours as needed for wheezing or shortness of breath.  Marland Kitchen aspirin EC 81 MG tablet Take 81 mg by mouth daily.  . clonazePAM (KLONOPIN) 0.5 MG tablet Take 0.5 mg by mouth 2 (two) times daily.   Marland Kitchen enoxaparin (LOVENOX) 120 MG/0.8ML injection Inject 0.8 mLs (120 mg total) into the skin every 12 (twelve) hours.  . Estradiol 10 MCG TABS vaginal tablet One tablet in vagina daily for 2 weeks then 3 times a week for total of 8 weeks (Patient taking differently: Place 10 mcg vaginally See admin instructions. One tablet in vagina daily for 2 weeks then 3 times a week for total of 8 weeks starting on 10/12/15.)  . furosemide (LASIX) 40 MG tablet Take 1 tablet (40 mg total) by mouth daily.  Marland Kitchen glucose blood test strip Use once daily to check blood sugar.  DX E11.9  . lamoTRIgine (LAMICTAL) 200 MG tablet Take 200 mg by mouth at bedtime.  . ONE TOUCH LANCETS MISC Test once daily to check blood sugar. DX E11.9  . pantoprazole (PROTONIX) 40 MG tablet TAKE 1 TABLET (40 MG TOTAL) BY MOUTH DAILY. (Patient taking differently: Take 40 mg by mouth daily. )  . rosuvastatin (CRESTOR) 40 MG tablet TAKE 1 TABLET (40 MG TOTAL) BY MOUTH DAILY. (Patient taking differently: Take 40 mg by mouth daily. )  . SEROQUEL XR 400 MG 24 hr tablet Take 400 mg by mouth at bedtime.   Marland Kitchen warfarin (COUMADIN) 5 MG tablet Take 2.5-5 mg by mouth at bedtime. Reported on 10/29/2015  . metoprolol succinate (TOPROL-XL) 25 MG 24 hr tablet TAKE 1 TABLET (25 MG TOTAL) BY MOUTH EVERY MORNING. (Patient taking differently: Take 25 mg by mouth daily. )  . [DISCONTINUED] Na Sulfate-K Sulfate-Mg Sulf 17.5-3.13-1.6 GM/180ML SOLN Take 1 kit by mouth once. (Patient not taking: Reported on 11/07/2015)   No facility-administered encounter medications on file as of 11/07/2015.    Allergy:  Allergies  Allergen Reactions  . Sulfonamide Derivatives Other (See Comments)    headaches    Social Hx:   Social  History   Social History  . Marital Status: Married    Spouse Name: N/A  . Number of Children: N/A  . Years of Education: N/A   Occupational History  . Not on file.   Social History Main Topics  . Smoking status: Former Smoker    Quit date: 05/26/1990  . Smokeless tobacco: Never Used  . Alcohol Use: 0.0 oz/week    0 Standard drinks or equivalent per week     Comment: rare  . Drug Use: No  . Sexual Activity: No   Other Topics Concern  . Not on file   Social History Narrative    Past Surgical Hx:  Past Surgical History  Procedure Laterality Date  . Appendectomy      Required revision for EColi infection, required recurrent packing  . Bowel obstruction      Requiring adhesions to be removed  . Aortic valve replacement    . Open heart with a cardiac aneurysm repair    . Cholecystectomy    . Tubal ligation    . Varicose vein surgery b/l legs    . Childhood exploratory surgery of heart    . Hernia repair  08-16-10  .  Abdominal hysterectomy  ?1998    sb/l spo, total for fibroids/heavy bleeding  . Total knee arthroplasty Right 02/24/2013    Procedure: RIGHT TOTAL KNEE ARTHROPLASTY;  Surgeon: Javier Docker, MD;  Location: [facility];  Service: Orthopedics;  Laterality: Right;  . Left heart catheterization with coronary angiogram N/A 12/05/2011    Procedure: LEFT HEART CATHETERIZATION WITH CORONARY ANGIOGRAM;  Surgeon: Lesleigh Noe, MD;  Location: [facility] CATH LAB;  Service: Cardiovascular;  Laterality: N/A;  . Cystocele repair N/A 10/16/2015    Procedure: ANTERIOR REPAIR (CYSTOCELE);  Surgeon: Reva Bores, MD;  Location: [facility];  Service: Gynecology;  Laterality: N/A;  . Colonoscopy with propofol N/A 11/01/2015    Procedure: COLONOSCOPY WITH PROPOFOL;  Surgeon: Rachael Fee, MD;  Location: [facility];  Service: Endoscopy;  Laterality: N/A;    Past Medical Hx:  Past Medical History  Diagnosis Date  . Anemia   . Anxiety   . Depression   . Diverticulosis   . Elevated  cholesterol   . Obesity   . THYROMEGALY 07/22/2010  . Overweight(278.02) 03/11/2010  . Mixed hyperlipidemia 03/11/2010  . MEASLES, HX OF 03/11/2010  . KNEE PAIN, RIGHT 05/28/2010  . HYPERTRIGLYCERIDEMIA 03/11/2010  . HEART MURMUR, HX OF 03/11/2010  . FIBROIDS, UTERUS 03/11/2010  . Diarrhea 08/05/2010  . CONSTIPATION 03/11/2010  . CHICKENPOX, HX OF 03/11/2010  . BIPOLAR AFFECTIVE DISORDER 03/11/2010  . AORTIC VALVE REPLACEMENT, HX OF 03/11/2010  . ANEMIA 05/28/2010  . Acute bronchitis 07/22/2010  . Abdominal pain, generalized 03/19/2010  . Peripheral edema 11/18/2010  . Bacterial vaginitis 11/18/2010  . Hematuria 12/18/2010  . Diverticulitis 12/18/2010  . Hyperthyroidism 12/18/2010  . Fatigue 12/30/2010  . Renal insufficiency 12/30/2010  . Grave's disease 8-12  . Arm lesion 03/23/2011  . UTI (lower urinary tract infection) 08/14/2011  . Breast pain in female 11/17/2011  . Chest pain 11/17/2011  . Hematoma 12/18/2011  . Cervical cancer screening 03/22/2012  . Foot pain, left 03/22/2012  . Foot fracture, left 03/22/2012  . Tick bite of back 10/15/2012  . Preop examination 02/03/2013  . Dysrhythmia   . Hypertension   . Shortness of breath     with exertion   . Heart murmur   . Urinary tract bacterial infections   . Arthritis     Past Gynecological History:  Hysterectomy in 1990's for fibroids  No LMP recorded. Patient has had a hysterectomy.  Family Hx:  Family History  Problem Relation Age of Onset  . Scoliosis Mother   . Arthritis Mother     Rheumatoid  . Aneurysm Mother     heart  . Alcohol abuse Father   . Depression Sister   . Depression Brother   . Alcohol abuse Brother   . Cancer Maternal Grandmother     colon  . Diabetes Maternal Grandfather   . Heart disease Maternal Grandfather   . Alcohol abuse Maternal Grandfather   . Depression Paternal Grandmother   . Diabetes Paternal Grandmother   . Depression Paternal Grandfather   . Diabetes Paternal Grandfather   .  Depression Sister   . Alcohol abuse Brother   . Depression Brother   . Heart disease Brother     Review of Systems:  Constitutional  Feels fatigued  ENT Normal appearing ears and nares bilaterally Skin/Breast  No rash, sores, jaundice, itching, dryness Cardiovascular  No chest pain, shortness of breath, or edema  Pulmonary  No cough or wheeze.  Gastro Intestinal  No nausea,  vomitting, or diarrhoea. No bright red blood per rectum, no abdominal pain, change in bowel movement, or constipation.  Genito Urinary  No frequency, urgency, dysuria, + vaginal bleeding Musculo Skeletal  No myalgia, arthralgia, joint swelling or pain  Neurologic  No weakness, numbness, change in gait,  Psychology  No depression, anxiety, insomnia.   Vitals:  Blood pressure 140/68, pulse 78, temperature 98.3 F (36.8 C), temperature source Oral, resp. rate 18, height '5\' 8"'$  (1.727 m), weight 253 lb 1.6 oz (114.805 kg), SpO2 99 %.  Physical Exam: WD in NAD Neck  Supple NROM, without any enlargements.  Lymph Node Survey No cervical supraclavicular or inguinal adenopathy Cardiovascular  Pulse normal rate, regularity and rhythm. S1 and S2 normal.  Lungs  Clear to auscultation bilateraly, without wheezes/crackles/rhonchi. Good air movement.  Skin  No rash/lesions/breakdown  Psychiatry  Alert and oriented to person, place, and time  Abdomen  Normoactive bowel sounds, abdomen soft, non-tender and obese without evidence of hernia. Vertical midline incisions seen. Back No CVA tenderness Genito Urinary  Vulva/vagina: Normal external female genitalia.  No lesions. No discharge or bleeding.  Bladder/urethra:  No lesions or masses, well supported bladder  Vagina: The vagina is mildly atrophic. There is some ulceration at the top right corner of the vaginal apex. There are no bald edges or exophytic lesions. There is no apparent affiliated infection. The lesion is friable and bleeds with touch. A biopsy was  taken from the bed of the ulcer.  Cervix: surgically absent  Uterus: surgically absent   Adnexa: no palpable masses. Rectal  deferred Extremities  No bilateral cyanosis, clubbing or edema.  PROCEDURE NOTE: vaginal biopsy Verbal consent was obtained. A Kevorkian curette was utilized to grasp the piece of tissue from the bed of the vaginal ulcer. He was sent for permanent pathology. Hemostasis was obtained at the bed of the ulcer with Monsel solution. The patient the procedure well.   Donaciano Eva, MD  11/07/2015, 10:28 AM

## 2015-11-07 NOTE — Telephone Encounter (Signed)
Discussed treatment options following visit with GYN/Onc. She will continue Estrogen 3x/wk. Bleeding is spotting only now. She will f/u with me at the end of July.

## 2015-11-07 NOTE — Patient Instructions (Signed)
We will contact you with the results of your biopsy and HPV testing.  Recommendation to continue vaginal estrogen.  Follow up with Dr. Kennon Rounds.  Please call for any questions or concerns.

## 2015-11-09 ENCOUNTER — Ambulatory Visit (INDEPENDENT_AMBULATORY_CARE_PROVIDER_SITE_OTHER): Payer: BLUE CROSS/BLUE SHIELD | Admitting: *Deleted

## 2015-11-09 ENCOUNTER — Telehealth: Payer: Self-pay

## 2015-11-09 DIAGNOSIS — Z5181 Encounter for therapeutic drug level monitoring: Secondary | ICD-10-CM

## 2015-11-09 DIAGNOSIS — Z954 Presence of other heart-valve replacement: Secondary | ICD-10-CM

## 2015-11-09 DIAGNOSIS — Z952 Presence of prosthetic heart valve: Secondary | ICD-10-CM

## 2015-11-09 DIAGNOSIS — I4891 Unspecified atrial fibrillation: Secondary | ICD-10-CM | POA: Diagnosis not present

## 2015-11-09 LAB — CERVICOVAGINAL ANCILLARY ONLY: HPV: NOT DETECTED

## 2015-11-09 LAB — POCT INR: INR: 3.1

## 2015-11-09 NOTE — Telephone Encounter (Signed)
Orders received from [place] to contact the patient to update with results collected on 11/07/2015 for vagina ,biopsy , cuff finding " granulation tissue with inflammation" , "no cancer or dysplasia" noted . HPV results are pending and we contact the patient as soon as the results completed. Patient contacted and updated , patient states understanding , patient is aware of HPV results pending. Patient denies further questions at this time .

## 2015-11-09 NOTE — Telephone Encounter (Signed)
Patient contacted with HPV results being "negative" , patient states understanding , denies further questions at this time .

## 2015-11-14 ENCOUNTER — Ambulatory Visit: Payer: BLUE CROSS/BLUE SHIELD | Admitting: Obstetrics & Gynecology

## 2015-11-16 ENCOUNTER — Ambulatory Visit (INDEPENDENT_AMBULATORY_CARE_PROVIDER_SITE_OTHER): Payer: BLUE CROSS/BLUE SHIELD | Admitting: *Deleted

## 2015-11-16 DIAGNOSIS — Z952 Presence of prosthetic heart valve: Secondary | ICD-10-CM

## 2015-11-16 DIAGNOSIS — Z954 Presence of other heart-valve replacement: Secondary | ICD-10-CM

## 2015-11-16 DIAGNOSIS — Z5181 Encounter for therapeutic drug level monitoring: Secondary | ICD-10-CM

## 2015-11-16 DIAGNOSIS — I4891 Unspecified atrial fibrillation: Secondary | ICD-10-CM

## 2015-11-16 LAB — POCT INR: INR: 2.1

## 2015-11-23 ENCOUNTER — Other Ambulatory Visit: Payer: Self-pay | Admitting: Family Medicine

## 2015-11-23 MED ORDER — PANTOPRAZOLE SODIUM 40 MG PO TBEC: DELAYED_RELEASE_TABLET | ORAL | Status: DC

## 2015-11-29 ENCOUNTER — Encounter (HOSPITAL_COMMUNITY): Payer: Self-pay | Admitting: Emergency Medicine

## 2015-11-29 ENCOUNTER — Inpatient Hospital Stay (HOSPITAL_COMMUNITY)
Admission: EM | Admit: 2015-11-29 | Discharge: 2015-12-01 | DRG: 920 | Disposition: A | Discharge status: Home / Self Care | Payer: PPO | Attending: Family Medicine | Admitting: Family Medicine

## 2015-11-29 ENCOUNTER — Emergency Department (HOSPITAL_COMMUNITY): Payer: PPO

## 2015-11-29 DIAGNOSIS — Z7901 Long term (current) use of anticoagulants: Secondary | ICD-10-CM | POA: Diagnosis not present

## 2015-11-29 DIAGNOSIS — Z87891 Personal history of nicotine dependence: Secondary | ICD-10-CM | POA: Diagnosis not present

## 2015-11-29 DIAGNOSIS — M25461 Effusion, right knee: Secondary | ICD-10-CM

## 2015-11-29 DIAGNOSIS — Z882 Allergy status to sulfonamides status: Secondary | ICD-10-CM | POA: Diagnosis not present

## 2015-11-29 DIAGNOSIS — T8189XA Other complications of procedures, not elsewhere classified, initial encounter: Principal | ICD-10-CM | POA: Diagnosis present

## 2015-11-29 DIAGNOSIS — I10 Essential (primary) hypertension: Secondary | ICD-10-CM | POA: Diagnosis present

## 2015-11-29 DIAGNOSIS — I4891 Unspecified atrial fibrillation: Secondary | ICD-10-CM | POA: Diagnosis not present

## 2015-11-29 DIAGNOSIS — Y838 Other surgical procedures as the cause of abnormal reaction of the patient, or of later complication, without mention of misadventure at the time of the procedure: Secondary | ICD-10-CM | POA: Diagnosis present

## 2015-11-29 DIAGNOSIS — E782 Mixed hyperlipidemia: Secondary | ICD-10-CM | POA: Diagnosis present

## 2015-11-29 DIAGNOSIS — Z96651 Presence of right artificial knee joint: Secondary | ICD-10-CM | POA: Diagnosis not present

## 2015-11-29 DIAGNOSIS — Z79899 Other long term (current) drug therapy: Secondary | ICD-10-CM

## 2015-11-29 DIAGNOSIS — M25061 Hemarthrosis, right knee: Secondary | ICD-10-CM | POA: Diagnosis present

## 2015-11-29 DIAGNOSIS — E0500 Thyrotoxicosis with diffuse goiter without thyrotoxic crisis or storm: Secondary | ICD-10-CM | POA: Diagnosis present

## 2015-11-29 DIAGNOSIS — Z954 Presence of other heart-valve replacement: Secondary | ICD-10-CM

## 2015-11-29 DIAGNOSIS — F419 Anxiety disorder, unspecified: Secondary | ICD-10-CM | POA: Diagnosis not present

## 2015-11-29 DIAGNOSIS — E669 Obesity, unspecified: Secondary | ICD-10-CM | POA: Diagnosis present

## 2015-11-29 DIAGNOSIS — Z7982 Long term (current) use of aspirin: Secondary | ICD-10-CM | POA: Diagnosis not present

## 2015-11-29 DIAGNOSIS — Z6838 Body mass index (BMI) 38.0-38.9, adult: Secondary | ICD-10-CM

## 2015-11-29 DIAGNOSIS — F319 Bipolar disorder, unspecified: Secondary | ICD-10-CM | POA: Diagnosis present

## 2015-11-29 DIAGNOSIS — M109 Gout, unspecified: Secondary | ICD-10-CM | POA: Diagnosis present

## 2015-11-29 DIAGNOSIS — M25561 Pain in right knee: Secondary | ICD-10-CM | POA: Diagnosis not present

## 2015-11-29 DIAGNOSIS — M25469 Effusion, unspecified knee: Secondary | ICD-10-CM | POA: Diagnosis present

## 2015-11-29 DIAGNOSIS — Z952 Presence of prosthetic heart valve: Secondary | ICD-10-CM

## 2015-11-29 LAB — BASIC METABOLIC PANEL
Anion gap: 8 (ref 5–15)
BUN: 15 mg/dL (ref 6–20)
CO2: 28 mmol/L (ref 22–32)
Calcium: 9.3 mg/dL (ref 8.9–10.3)
Chloride: 103 mmol/L (ref 101–111)
Creatinine, Ser: 0.91 mg/dL (ref 0.44–1.00)
GFR calc Af Amer: >60 mL/min (ref >60)
GFR calc non Af Amer: >60 mL/min (ref >60)
Glucose, Bld: 129 mg/dL — ABNORMAL HIGH (ref 65–99)
Potassium: 4.3 mmol/L (ref 3.5–5.1)
Sodium: 139 mmol/L (ref 135–145)

## 2015-11-29 LAB — CBC WITH DIFFERENTIAL/PLATELET
Basophils Absolute: 0.1 10*3/uL (ref 0.0–0.1)
Basophils Relative: 1 %
Eosinophils Absolute: 0.1 10*3/uL (ref 0.0–0.7)
Eosinophils Relative: 1 %
HCT: 34.3 % — ABNORMAL LOW (ref 36.0–46.0)
Hemoglobin: 11.7 g/dL — ABNORMAL LOW (ref 12.0–15.0)
Lymphocytes Relative: 21 %
Lymphs Abs: 1.6 10*3/uL (ref 0.7–4.0)
MCH: 30.5 pg (ref 26.0–34.0)
MCHC: 34.1 g/dL (ref 30.0–36.0)
MCV: 89.3 fL (ref 78.0–100.0)
Monocytes Absolute: 0.6 10*3/uL (ref 0.1–1.0)
Monocytes Relative: 8 %
Neutro Abs: 5.1 10*3/uL (ref 1.7–7.7)
Neutrophils Relative %: 69 %
Platelets: 217 10*3/uL (ref 150–400)
RBC: 3.84 MIL/uL — ABNORMAL LOW (ref 3.87–5.11)
RDW: 14.1 % (ref 11.5–15.5)
WBC: 7.5 10*3/uL (ref 4.0–10.5)

## 2015-11-29 LAB — PROTIME-INR
INR: 2.55 — ABNORMAL HIGH (ref 0.00–1.49)
Prothrombin Time: 27.1 seconds — ABNORMAL HIGH (ref 11.6–15.2)

## 2015-11-29 LAB — URIC ACID: Uric Acid, Serum: 9 mg/dL — ABNORMAL HIGH (ref 2.3–6.6)

## 2015-11-29 LAB — SEDIMENTATION RATE: Sed Rate: 8 mm/hr (ref 0–22)

## 2015-11-29 LAB — C-REACTIVE PROTEIN: CRP: 0.8 mg/dL (ref <1.0)

## 2015-11-29 MED ORDER — FENOFIBRATE 160 MG PO TABS
160.0000 mg | ORAL_TABLET | Freq: Every day | ORAL | Status: DC
  Administered 2015-11-29 – 2015-12-01 (×3): 160 mg via ORAL
  Filled 2015-11-29 (×3): qty 1

## 2015-11-29 MED ORDER — OXYCODONE HCL 5 MG PO TABS
5.0000 mg | ORAL_TABLET | ORAL | Status: DC | PRN
  Administered 2015-11-29 (×2): 5 mg via ORAL
  Filled 2015-11-29 (×2): qty 1

## 2015-11-29 MED ORDER — RISAQUAD PO CAPS
1.0000 | ORAL_CAPSULE | Freq: Every day | ORAL | Status: DC
  Administered 2015-11-29 – 2015-12-01 (×3): 1 via ORAL
  Filled 2015-11-29 (×3): qty 1

## 2015-11-29 MED ORDER — FUROSEMIDE 40 MG PO TABS
40.0000 mg | ORAL_TABLET | Freq: Every day | ORAL | Status: DC
  Administered 2015-11-30 – 2015-12-01 (×2): 40 mg via ORAL
  Filled 2015-11-29 (×2): qty 1

## 2015-11-29 MED ORDER — SENNOSIDES-DOCUSATE SODIUM 8.6-50 MG PO TABS: 1.0000 | ORAL_TABLET | Freq: Every evening | ORAL | Status: DC | PRN

## 2015-11-29 MED ORDER — ACETAMINOPHEN 325 MG PO TABS
650.0000 mg | ORAL_TABLET | Freq: Four times a day (QID) | ORAL | Status: DC | PRN
  Administered 2015-11-29: 650 mg via ORAL
  Filled 2015-11-29: qty 2

## 2015-11-29 MED ORDER — SODIUM CHLORIDE 0.9% FLUSH
3.0000 mL | Freq: Two times a day (BID) | INTRAVENOUS | Status: DC
  Administered 2015-12-01: 3 mL via INTRAVENOUS

## 2015-11-29 MED ORDER — SODIUM CHLORIDE 0.9% FLUSH: 3.0000 mL | INTRAVENOUS | Status: DC | PRN

## 2015-11-29 MED ORDER — ROSUVASTATIN CALCIUM 40 MG PO TABS
40.0000 mg | ORAL_TABLET | Freq: Every day | ORAL | Status: DC
  Administered 2015-11-29 – 2015-12-01 (×3): 40 mg via ORAL
  Filled 2015-11-29 (×3): qty 1

## 2015-11-29 MED ORDER — SODIUM CHLORIDE 0.9 % IV SOLN: 250.0000 mL | INTRAVENOUS | Status: DC | PRN

## 2015-11-29 MED ORDER — MORPHINE SULFATE (PF) 2 MG/ML IV SOLN
1.0000 mg | INTRAVENOUS | Status: DC | PRN
  Administered 2015-11-29: 1 mg via INTRAVENOUS
  Filled 2015-11-29: qty 1

## 2015-11-29 MED ORDER — FENTANYL CITRATE (PF) 100 MCG/2ML IJ SOLN: 50.0000 ug | INTRAMUSCULAR | Status: DC | PRN

## 2015-11-29 MED ORDER — PREDNISONE 20 MG PO TABS
40.0000 mg | ORAL_TABLET | Freq: Every day | ORAL | Status: DC
  Administered 2015-11-29 – 2015-12-01 (×3): 40 mg via ORAL
  Filled 2015-11-29 (×3): qty 2

## 2015-11-29 MED ORDER — FENTANYL CITRATE (PF) 100 MCG/2ML IJ SOLN
50.0000 ug | Freq: Once | INTRAMUSCULAR | Status: AC
  Administered 2015-11-29: 50 ug via INTRAVENOUS
  Filled 2015-11-29: qty 2

## 2015-11-29 MED ORDER — ONDANSETRON HCL 4 MG PO TABS
4.0000 mg | ORAL_TABLET | Freq: Four times a day (QID) | ORAL | Status: DC | PRN
Start: 2015-11-29 — End: 2015-12-01

## 2015-11-29 MED ORDER — PANTOPRAZOLE SODIUM 40 MG PO TBEC
40.0000 mg | DELAYED_RELEASE_TABLET | Freq: Every day | ORAL | Status: DC
  Administered 2015-11-29 – 2015-12-01 (×3): 40 mg via ORAL
  Filled 2015-11-29 (×3): qty 1

## 2015-11-29 MED ORDER — WARFARIN SODIUM 5 MG PO TABS
5.0000 mg | ORAL_TABLET | Freq: Once | ORAL | Status: AC
  Administered 2015-11-29: 5 mg via ORAL
  Filled 2015-11-29: qty 1

## 2015-11-29 MED ORDER — CLONAZEPAM 0.5 MG PO TABS
0.5000 mg | ORAL_TABLET | Freq: Two times a day (BID) | ORAL | Status: DC
  Administered 2015-11-29 – 2015-12-01 (×5): 0.5 mg via ORAL
  Filled 2015-11-29 (×4): qty 1

## 2015-11-29 MED ORDER — ONDANSETRON HCL 4 MG/2ML IJ SOLN: 4.0000 mg | Freq: Four times a day (QID) | INTRAMUSCULAR | Status: DC | PRN

## 2015-11-29 MED ORDER — ESTRADIOL 10 MCG VA TABS: 10.0000 ug | ORAL_TABLET | VAGINAL | Status: DC

## 2015-11-29 MED ORDER — WARFARIN - PHARMACIST DOSING INPATIENT: Freq: Every day | Status: DC

## 2015-11-29 MED ORDER — COLCHICINE 0.6 MG PO TABS
0.6000 mg | ORAL_TABLET | Freq: Every day | ORAL | Status: DC
  Administered 2015-11-29 – 2015-12-01 (×3): 0.6 mg via ORAL
  Filled 2015-11-29 (×3): qty 1

## 2015-11-29 MED ORDER — ONDANSETRON HCL 4 MG/2ML IJ SOLN: 4.0000 mg | Freq: Three times a day (TID) | INTRAMUSCULAR | Status: AC | PRN

## 2015-11-29 MED ORDER — METOPROLOL SUCCINATE ER 25 MG PO TB24
25.0000 mg | ORAL_TABLET | Freq: Every morning | ORAL | Status: DC
  Administered 2015-11-29 – 2015-12-01 (×3): 25 mg via ORAL
  Filled 2015-11-29 (×3): qty 1

## 2015-11-29 MED ORDER — QUETIAPINE FUMARATE ER 400 MG PO TB24
400.0000 mg | ORAL_TABLET | Freq: Every day | ORAL | Status: DC
  Administered 2015-11-29 – 2015-11-30 (×2): 400 mg via ORAL
  Filled 2015-11-29 (×2): qty 1

## 2015-11-29 MED ORDER — SODIUM CHLORIDE 0.9 % IV SOLN
INTRAVENOUS | Status: AC
  Administered 2015-11-29: 15:00:00 via INTRAVENOUS

## 2015-11-29 MED ORDER — MORPHINE SULFATE (PF) 2 MG/ML IV SOLN
1.0000 mg | INTRAVENOUS | Status: DC | PRN
  Administered 2015-11-29 (×2): 2 mg via INTRAVENOUS
  Filled 2015-11-29 (×2): qty 1

## 2015-11-29 MED ORDER — OXYCODONE-ACETAMINOPHEN 5-325 MG PO TABS
1.0000 | ORAL_TABLET | Freq: Once | ORAL | Status: AC
  Administered 2015-11-29: 1 via ORAL
  Filled 2015-11-29: qty 1

## 2015-11-29 NOTE — ED Notes (Signed)
ORTHO JON PRESENT TO ASSIST WITH IMMOBILIZER AND AMBULATION WITH WALKER

## 2015-11-29 NOTE — H&P (Addendum)
History and Physical    [name] FZZ:[id number] DOB: 12/07/1950 DOA: 11/29/2015  PCP: Danise Edge, MD  Patient coming from: Home   Chief Complaint: knee pain, unable to ambulate.   HPI: [name] is a 65 y.o. female with medical history significant of Aortic Mechanical valve on coumadin, recent admission for vaginal bleed found to have vaginal ulcer, no further bleeding, right knee replacement 2014 who presents complaining of right knee pain that started 1 to 2 month ago. The knee pain has been getting progressively worse. She was supposed to follow up with her orthopedic yesterday but had to cancel appointment due to family events. She presents today to  ED because knee swelling and pain got worse overnight. She was not able to ambulate today. She denies fever, trauma to the knee, chill, nausea, vomiting, chest pain or dyspnea.   ED Course: presents with knee pain, normal WBC, electrolytes, INR 2.5, Knee X ray ; no fracture or dislocation, large knee joint effusion.   Review of Systems: As per HPI otherwise 10 point review of systems negative.    Past Medical History  Diagnosis Date  . Anemia   . Anxiety   . Depression   . Diverticulosis   . Elevated cholesterol   . Obesity   . THYROMEGALY 07/22/2010  . Overweight(278.02) 03/11/2010  . Mixed hyperlipidemia 03/11/2010  . MEASLES, HX OF 03/11/2010  . KNEE PAIN, RIGHT 05/28/2010  . HYPERTRIGLYCERIDEMIA 03/11/2010  . HEART MURMUR, HX OF 03/11/2010  . FIBROIDS, UTERUS 03/11/2010  . Diarrhea 08/05/2010  . CONSTIPATION 03/11/2010  . CHICKENPOX, HX OF 03/11/2010  . BIPOLAR AFFECTIVE DISORDER 03/11/2010  . AORTIC VALVE REPLACEMENT, HX OF 03/11/2010  . ANEMIA 05/28/2010  . Acute bronchitis 07/22/2010  . Abdominal pain, generalized 03/19/2010  . Peripheral edema 11/18/2010  . Bacterial vaginitis 11/18/2010  . Hematuria 12/18/2010  . Diverticulitis 12/18/2010  . Hyperthyroidism 12/18/2010  . Fatigue 12/30/2010  . Renal insufficiency  12/30/2010  . Grave's disease 8-12  . Arm lesion 03/23/2011  . UTI (lower urinary tract infection) 08/14/2011  . Breast pain in female 11/17/2011  . Chest pain 11/17/2011  . Hematoma 12/18/2011  . Cervical cancer screening 03/22/2012  . Foot pain, left 03/22/2012  . Foot fracture, left 03/22/2012  . Tick bite of back 10/15/2012  . Preop examination 02/03/2013  . Dysrhythmia   . Hypertension   . Shortness of breath     with exertion   . Heart murmur   . Urinary tract bacterial infections   . Arthritis     Past Surgical History  Procedure Laterality Date  . Appendectomy      Required revision for EColi infection, required recurrent packing  . Bowel obstruction      Requiring adhesions to be removed  . Aortic valve replacement    . Open heart with a cardiac aneurysm repair    . Cholecystectomy    . Tubal ligation    . Varicose vein surgery b/l legs    . Childhood exploratory surgery of heart    . Hernia repair  08-16-10  . Abdominal hysterectomy  ?1998    sb/l spo, total for fibroids/heavy bleeding  . Total knee arthroplasty Right 02/24/2013    Procedure: RIGHT TOTAL KNEE ARTHROPLASTY;  Surgeon: Javier Docker, MD;  Location: [facility];  Service: Orthopedics;  Laterality: Right;  . Left heart catheterization with coronary angiogram N/A 12/05/2011    Procedure: LEFT HEART CATHETERIZATION WITH CORONARY ANGIOGRAM;  Surgeon: Barry Dienes  Blenda Bridegroom, MD;  Location: [facility] CATH LAB;  Service: Cardiovascular;  Laterality: N/A;  . Cystocele repair N/A 10/16/2015    Procedure: ANTERIOR REPAIR (CYSTOCELE);  Surgeon: Donnamae Jude, MD;  Location: [facility];  Service: Gynecology;  Laterality: N/A;  . Colonoscopy with propofol N/A 11/01/2015    Procedure: COLONOSCOPY WITH PROPOFOL;  Surgeon: Milus Banister, MD;  Location: [facility];  Service: Endoscopy;  Laterality: N/A;     reports that she quit smoking about 25 years ago. She has never used smokeless tobacco. She reports that she drinks alcohol. She reports  that she does not use illicit drugs.  Allergies  Allergen Reactions  . Sulfonamide Derivatives Other (See Comments)    headaches    Family History  Problem Relation Age of Onset  . Scoliosis Mother   . Arthritis Mother     Rheumatoid  . Aneurysm Mother     heart  . Alcohol abuse Father   . Depression Sister   . Depression Brother   . Alcohol abuse Brother   . Cancer Maternal Grandmother     colon  . Diabetes Maternal Grandfather   . Heart disease Maternal Grandfather   . Alcohol abuse Maternal Grandfather   . Depression Paternal Grandmother   . Diabetes Paternal Grandmother   . Depression Paternal Grandfather   . Diabetes Paternal Grandfather   . Depression Sister   . Alcohol abuse Brother   . Depression Brother   . Heart disease Brother      Prior to Admission medications   Medication Sig Start Date End Date Taking? Authorizing Provider  acetaminophen (TYLENOL) 500 MG tablet Take 1,000-1,500 mg by mouth every 6 (six) hours as needed (For knee pain.).   Yes Historical Provider, MD  aspirin EC 81 MG tablet Take 81 mg by mouth daily.   Yes Historical Provider, MD  clonazePAM (KLONOPIN) 0.5 MG tablet Take 0.5 mg by mouth 2 (two) times daily.    Yes Historical Provider, MD  cyclobenzaprine (FLEXERIL) 10 MG tablet Take 10 mg by mouth 3 (three) times daily as needed for muscle spasms.   Yes Historical Provider, MD  Estradiol 10 MCG TABS vaginal tablet One tablet in vagina daily for 2 weeks then 3 times a week for total of 8 weeks Patient taking differently: Place 10 mcg vaginally every Monday, Wednesday, and Friday.  10/12/15  Yes Woodroe Mode, MD  fenofibrate 160 MG tablet Take 160 mg by mouth daily.   Yes Historical Provider, MD  furosemide (LASIX) 40 MG tablet Take 1 tablet (40 mg total) by mouth daily. 06/06/15  Yes Belva Crome, MD  glucose blood test strip Use once daily to check blood sugar.  DX E11.9 04/26/15  Yes Mosie Lukes, MD  lamoTRIgine (LAMICTAL) 200 MG  tablet Take 200 mg by mouth at bedtime.   Yes Historical Provider, MD  metoprolol succinate (TOPROL-XL) 25 MG 24 hr tablet TAKE 1 TABLET (25 MG TOTAL) BY MOUTH EVERY MORNING. Patient taking differently: Take 25 mg by mouth every morning.  10/12/15  Yes Mosie Lukes, MD  ONE TOUCH LANCETS MISC Test once daily to check blood sugar. DX E11.9 04/26/15  Yes Mosie Lukes, MD  pantoprazole (PROTONIX) 40 MG tablet TAKE 1 TABLET (40 MG TOTAL) BY MOUTH DAILY. Patient taking differently: Take 40 mg by mouth daily. TAKE 1 TABLET (40 MG TOTAL) BY MOUTH DAILY. 11/23/15  Yes Mosie Lukes, MD  Polyethyl Glycol-Propyl Glycol (SYSTANE OP) Place 1 drop  into both eyes at bedtime as needed (For dry eyes.).    Yes Historical Provider, MD  Probiotic Product (ALIGN) 4 MG CAPS Take 4 mg by mouth daily.   Yes Historical Provider, MD  rosuvastatin (CRESTOR) 40 MG tablet TAKE 1 TABLET (40 MG TOTAL) BY MOUTH DAILY. Patient taking differently: Take 40 mg by mouth daily.  09/18/15  Yes Mosie Lukes, MD  SEROQUEL XR 400 MG 24 hr tablet Take 400 mg by mouth at bedtime.  05/30/15  Yes Historical Provider, MD  venlafaxine XR (EFFEXOR-XR) 150 MG 24 hr capsule Take 150 mg by mouth every morning.   Yes Historical Provider, MD  warfarin (COUMADIN) 5 MG tablet Take 2.5-5 mg by mouth at bedtime. Take half a tablet (2.'5mg'$ ) on Tuesday, Thursday, Saturday and one tablet ('5mg'$ ) the rest of the week.   Yes Historical Provider, MD    Physical Exam: Filed Vitals:   11/29/15 0911 11/29/15 1030 11/29/15 1100 11/29/15 1130  BP: 129/61 121/54 139/67 124/71  Pulse: 55 57 59 58  Temp:      TempSrc:      Resp: 15 16    Height:      Weight:      SpO2: 97% 97% 97% 96%      Constitutional: NAD, calm, comfortable Filed Vitals:   11/29/15 0911 11/29/15 1030 11/29/15 1100 11/29/15 1130  BP: 129/61 121/54 139/67 124/71  Pulse: 55 57 59 58  Temp:      TempSrc:      Resp: 15 16    Height:      Weight:      SpO2: 97% 97% 97% 96%   Eyes:  PERRL, lids and conjunctivae normal ENMT: Mucous membranes are moist. Posterior pharynx clear of any exudate or lesions.Normal dentition.  Neck: normal, supple, no masses, no thyromegaly Respiratory: clear to auscultation bilaterally, no wheezing, no crackles. Normal respiratory effort. No accessory muscle use.  Cardiovascular: Regular rate and rhythm, no murmurs / rubs / gallops. No extremity edema. 2+ pedal pulses. No carotid bruits.  Abdomen: no tenderness, no masses palpated. No hepatosplenomegaly. Bowel sounds positive.  Musculoskeletal: no clubbing / cyanosis. No joint deformity upper  Good ROM, no contractures. Normal muscle tone.  Right knee with edema, effusion, no redness, no discoloration. Tender to palpation  Skin: no rashes, lesions, ulcers. No induration Neurologic: CN 2-12 grossly intact. Sensation intact, DTR normal. Strength 5/5 in all 4.  Psychiatric: Normal judgment and insight. Alert and oriented x 3. Normal mood.     Labs on Admission: I have personally reviewed following labs and imaging studies  CBC:  Recent Labs Lab 11/29/15 0944  WBC 7.5  NEUTROABS 5.1  HGB 11.7*  HCT 34.3*  MCV 89.3  PLT 419   Basic Metabolic Panel:  Recent Labs Lab 11/29/15 0944  NA 139  K 4.3  CL 103  CO2 28  GLUCOSE 129*  BUN 15  CREATININE 0.91  CALCIUM 9.3   GFR: Estimated Creatinine Clearance: 81.8 mL/min (by C-G formula based on Cr of 0.91). Liver Function Tests: No results for input(s): AST, ALT, ALKPHOS, BILITOT, PROT, ALBUMIN in the last 168 hours. No results for input(s): LIPASE, AMYLASE in the last 168 hours. No results for input(s): AMMONIA in the last 168 hours. Coagulation Profile:  Recent Labs Lab 11/29/15 1040  INR 2.55*   Cardiac Enzymes: No results for input(s): CKTOTAL, CKMB, CKMBINDEX, TROPONINI in the last 168 hours. BNP (last 3 results) No results for input(s): PROBNP in the  last 8760 hours. HbA1C: No results for input(s): HGBA1C in the last  72 hours. CBG: No results for input(s): GLUCAP in the last 168 hours. Lipid Profile: No results for input(s): CHOL, HDL, LDLCALC, TRIG, CHOLHDL, LDLDIRECT in the last 72 hours. Thyroid Function Tests: No results for input(s): TSH, T4TOTAL, FREET4, T3FREE, THYROIDAB in the last 72 hours. Anemia Panel: No results for input(s): VITAMINB12, FOLATE, FERRITIN, TIBC, IRON, RETICCTPCT in the last 72 hours. Urine analysis:    Component Value Date/Time   COLORURINE YELLOW 04/03/2015 [place] 04/03/2015 1014   LABSPEC >=1.030* 04/03/2015 1014   PHURINE 5.5 04/03/2015 1014   GLUCOSEU NEGATIVE 04/03/2015 1014   GLUCOSEU NEGATIVE 02/17/2013 1327   HGBUR NEGATIVE 04/03/2015 1014   BILIRUBINUR neg 07/18/2015 1031   BILIRUBINUR NEGATIVE 04/03/2015 1014   KETONESUR NEGATIVE 04/03/2015 1014   PROTEINUR neg 07/18/2015 1031   PROTEINUR NEGATIVE 02/17/2013 1327   UROBILINOGEN 0.2 07/18/2015 1031   UROBILINOGEN 0.2 04/03/2015 1014   NITRITE neg 07/18/2015 1031   NITRITE NEGATIVE 04/03/2015 1014   LEUKOCYTESUR Negative 07/18/2015 1031   Sepsis Labs: !!!!!!!!!!!!!!!!!!!!!!!!!!!!!!!!!!!!!!!!!!!! '@LABRCNTIP'$ (procalcitonin:4,lacticidven:4) )No results found for this or any previous visit (from the past 240 hour(s)).   Radiological Exams on Admission: Dg Knee Complete 4 Views Right  11/29/2015  CLINICAL DATA:  Status post fall, with right knee pain and swelling. Initial encounter. EXAM: RIGHT KNEE - COMPLETE 4+ VIEW COMPARISON:  Right knee radiographs performed 02/24/2013 FINDINGS: There is no evidence of fracture or dislocation. The patient's total knee arthroplasty appears grossly intact, without evidence of loosening. A fabella is noted. A large knee joint effusion is noted. IMPRESSION: 1. No evidence of fracture or dislocation. Total knee arthroplasty appears grossly intact, without evidence of loosening. 2. Large knee joint effusion noted. Electronically Signed   By: Garald Balding M.D.    On: 11/29/2015 07:07    EKG: none available  Assessment/Plan Principal Problem:   Effusion of right knee Active Problems:   Hx of mechanical aortic valve replacement   Knee effusion   1-Right knee Effusion;  Ortho consulted.  Differential arthritis vs hemarthrosis, less likely infection.  ESR normal, CRP pending.  INR at 2.5.  Ortho to determine if arthrocentesis is needed.  Pain management, IV morphine PRN.  Discussed with Dr Tonita Cong no aspiration at this time ESR normal. Ok to resume coumadin. Uric acid elevated could try colchicine, prednisone. PT evaluation in am ,  use brace for ambulation.   2-Aortic Mechanical Valve; await ortho recommendation. Waiting call back from Dr Maxie Better to discuss anticoagulation. In mean time will order coumadin per pharmacy.  -hold aspirin.   3-History of A fib; on metoprolol.   4-Depression; Bipolar. Continue with Seroquel. Need med-rec for Lamictal and Effexor, patient wont sure which one she was taking.     DVT prophylaxis: on coumadin for mechanical valve.  Code Status: Full code Family Communication: care discussed with patient.  Disposition Plan: admit under observation, discharge home  Consults called: orthopedic.  Admission status: observation, med-surgery   Elmarie Shiley MD [facility] Pager [phone]  If 7PM-7AM, please contact night-coverage [email or web address] Password [email or web address]  11/29/2015, 11:57 AM

## 2015-11-29 NOTE — ED Notes (Signed)
ED PA at bedside

## 2015-11-29 NOTE — Consult Note (Signed)
Reason for Consult:Right knee pain Referring Physician: EDP  [name] is an 65 y.o. female.  HPI: Knee pain for one wee. No trauma. No fevers. Recent hx of multiple bleeds due to coumadin And 6 ER visits.  Past Medical History  Diagnosis Date  . Anemia   . Anxiety   . Depression   . Diverticulosis   . Elevated cholesterol   . Obesity   . THYROMEGALY 07/22/2010  . Overweight(278.02) 03/11/2010  . Mixed hyperlipidemia 03/11/2010  . MEASLES, HX OF 03/11/2010  . KNEE PAIN, RIGHT 05/28/2010  . HYPERTRIGLYCERIDEMIA 03/11/2010  . HEART MURMUR, HX OF 03/11/2010  . FIBROIDS, UTERUS 03/11/2010  . Diarrhea 08/05/2010  . CONSTIPATION 03/11/2010  . CHICKENPOX, HX OF 03/11/2010  . BIPOLAR AFFECTIVE DISORDER 03/11/2010  . AORTIC VALVE REPLACEMENT, HX OF 03/11/2010  . ANEMIA 05/28/2010  . Acute bronchitis 07/22/2010  . Abdominal pain, generalized 03/19/2010  . Peripheral edema 11/18/2010  . Bacterial vaginitis 11/18/2010  . Hematuria 12/18/2010  . Diverticulitis 12/18/2010  . Hyperthyroidism 12/18/2010  . Fatigue 12/30/2010  . Renal insufficiency 12/30/2010  . Grave's disease 8-12  . Arm lesion 03/23/2011  . UTI (lower urinary tract infection) 08/14/2011  . Breast pain in female 11/17/2011  . Chest pain 11/17/2011  . Hematoma 12/18/2011  . Cervical cancer screening 03/22/2012  . Foot pain, left 03/22/2012  . Foot fracture, left 03/22/2012  . Tick bite of back 10/15/2012  . Preop examination 02/03/2013  . Dysrhythmia   . Hypertension   . Shortness of breath     with exertion   . Heart murmur   . Urinary tract bacterial infections   . Arthritis     Past Surgical History  Procedure Laterality Date  . Appendectomy      Required revision for EColi infection, required recurrent packing  . Bowel obstruction      Requiring adhesions to be removed  . Aortic valve replacement    . Open heart with a cardiac aneurysm repair    . Cholecystectomy    . Tubal ligation    . Varicose vein surgery  b/l legs    . Childhood exploratory surgery of heart    . Hernia repair  08-16-10  . Abdominal hysterectomy  ?1998    sb/l spo, total for fibroids/heavy bleeding  . Total knee arthroplasty Right 02/24/2013    Procedure: RIGHT TOTAL KNEE ARTHROPLASTY;  Surgeon: Johnn Hai, MD;  Location: [facility];  Service: Orthopedics;  Laterality: Right;  . Left heart catheterization with coronary angiogram N/A 12/05/2011    Procedure: LEFT HEART CATHETERIZATION WITH CORONARY ANGIOGRAM;  Surgeon: Sinclair Grooms, MD;  Location[facility] CATH LAB;  Service: Cardiovascular;  Laterality: N/A;  . Cystocele repair N/A 10/16/2015    Procedure: ANTERIOR REPAIR (CYSTOCELE);  Surgeon: Donnamae Jude, MD;  Location: [facility];  Service: Gynecology;  Laterality: N/A;  . Colonoscopy with propofol N/A 11/01/2015    Procedure: COLONOSCOPY WITH PROPOFOL;  Surgeon: Milus Banister, MD;  Location: [facility];  Service: Endoscopy;  Laterality: N/A;    Family History  Problem Relation Age of Onset  . Scoliosis Mother   . Arthritis Mother     Rheumatoid  . Aneurysm Mother     heart  . Alcohol abuse Father   . Depression Sister   . Depression Brother   . Alcohol abuse Brother   . Cancer Maternal Grandmother     colon  . Diabetes Maternal Grandfather   . Heart disease  Maternal Grandfather   . Alcohol abuse Maternal Grandfather   . Depression Paternal Grandmother   . Diabetes Paternal Grandmother   . Depression Paternal Grandfather   . Diabetes Paternal Grandfather   . Depression Sister   . Alcohol abuse Brother   . Depression Brother   . Heart disease Brother     Social History:  reports that she quit smoking about 25 years ago. She has never used smokeless tobacco. She reports that she drinks alcohol. She reports that she does not use illicit drugs.  Allergies:  Allergies  Allergen Reactions  . Sulfonamide Derivatives Other (See Comments)    headaches    Medications: I have reviewed the patient's current  medications.  Results for orders placed or performed during the hospital encounter of 11/29/15 (from the past 48 hour(s))  CBC with Differential     Status: Abnormal   Collection Time: 11/29/15  9:44 AM  Result Value Ref Range   WBC 7.5 4.0 - 10.5 K/uL   RBC 3.84 (L) 3.87 - 5.11 MIL/uL   Hemoglobin 11.7 (L) 12.0 - 15.0 g/dL   HCT 34.3 (L) 36.0 - 46.0 %   MCV 89.3 78.0 - 100.0 fL   MCH 30.5 26.0 - 34.0 pg   MCHC 34.1 30.0 - 36.0 g/dL   RDW 14.1 11.5 - 15.5 %   Platelets 217 150 - 400 K/uL   Neutrophils Relative % 69 %   Neutro Abs 5.1 1.7 - 7.7 K/uL   Lymphocytes Relative 21 %   Lymphs Abs 1.6 0.7 - 4.0 K/uL   Monocytes Relative 8 %   Monocytes Absolute 0.6 0.1 - 1.0 K/uL   Eosinophils Relative 1 %   Eosinophils Absolute 0.1 0.0 - 0.7 K/uL   Basophils Relative 1 %   Basophils Absolute 0.1 0.0 - 0.1 K/uL    Dg Knee Complete 4 Views Right  11/29/2015  CLINICAL DATA:  Status post fall, with right knee pain and swelling. Initial encounter. EXAM: RIGHT KNEE - COMPLETE 4+ VIEW COMPARISON:  Right knee radiographs performed 02/24/2013 FINDINGS: There is no evidence of fracture or dislocation. The patient's total knee arthroplasty appears grossly intact, without evidence of loosening. A fabella is noted. A large knee joint effusion is noted. IMPRESSION: 1. No evidence of fracture or dislocation. Total knee arthroplasty appears grossly intact, without evidence of loosening. 2. Large knee joint effusion noted. Electronically Signed   By: Garald Balding M.D.   On: 11/29/2015 07:07    Review of Systems  Musculoskeletal: Positive for joint pain.  All other systems reviewed and are negative.  Blood pressure 129/61, pulse 55, temperature 97.9 F (36.6 C), temperature source Oral, resp. rate 15, height '5\' 8"'$  (1.727 m), weight 114.306 kg (252 lb), SpO2 97 %. Physical Exam  Constitutional: She is oriented to person, place, and time. She appears well-developed.  HENT:  Head: Normocephalic.  Eyes:  Pupils are equal, round, and reactive to light.  Neck: Normal range of motion.  Cardiovascular: Normal rate.   Respiratory: Effort normal.  GI: Soft.  Musculoskeletal:  Effusion right knee. No erythema. No DVT. ROM 0-60. NVI.  Neurological: She is alert and oriented to person, place, and time.  Skin: Skin is warm and dry.  Psychiatric: She has a normal mood and affect.    Assessment/Plan:  Right knee Hemarthrosis s/p TKR. Plan Check INR ESR CRP. Aspiration if INR normal and ESR, CRP elevated. Knee immobilizer.  Laveah Gloster C 11/29/2015, 10:24 AM  [phone] cell.

## 2015-11-29 NOTE — ED Provider Notes (Signed)
Medical screening examination/treatment/procedure(s) were conducted as a shared visit with non-physician practitioner(s) and myself.  I personally evaluated the patient during the encounter.  Pt has been having worsening pain in her right knee for the last few weeks.  Pain was mild initially but now gradually worse.  Unable to bear weight now even using a walker.  Pt was scheduled to see ortho but had to cancel appointment.  Xray shows large joint effusion.  Given pain meds but unable to bear weight.  Will give dose of IV pain meds.  Consult ortho to consider joint aspiration.   Dorie Rank, MD 11/29/15 [phone]

## 2015-11-29 NOTE — ED Notes (Signed)
VEGAN DIET

## 2015-11-29 NOTE — ED Notes (Signed)
Pt comes to ed via ems, c/o right knee pain. Pt had full knee replacement 3 years ago. However 3 weeks ago she started pain and walking instability. This morning she going to the bathroom, and was using a walker. Felt like her knee was going to give out on her. Family assisted her to the ground.  No fall. Witnessed assist to ground.  Pain  8 out 10.  Medical hx of  valve in heart. V/s on arrival 125/51, hr 64, rr 18.  Ax. Sulfa drugs.  Family in the room. Daughter at bed side.

## 2015-11-29 NOTE — ED Notes (Signed)
MD at bedside. Ortho J Beane present to evaluate pt- Delay in [place]  IV access

## 2015-11-29 NOTE — Progress Notes (Signed)
ANTICOAGULATION CONSULT NOTE - Initial Consult  Pharmacy Consult for Warfarin Indication: chronic anti-coag for AVR  Allergies  Allergen Reactions  . Sulfonamide Derivatives Other (See Comments)    headaches   Patient Measurements: Height: 5\' 8"  (172.7 cm) Weight: 252 lb (114.306 kg) IBW/kg (Calculated) : 63.9  Vital Signs: Temp: 97.9 F (36.6 C) (07/06 0600) Temp Source: Oral (07/06 0600) BP: 124/71 mmHg (07/06 1130) Pulse Rate: 58 (07/06 1130)  Labs:  Recent Labs  11/29/15 0944 11/29/15 1040  HGB 11.7*  --   HCT 34.3*  --   PLT 217  --   LABPROT  --  27.1*  INR  --  2.55*  CREATININE 0.91  --    Estimated Creatinine Clearance: 81.8 mL/min (by C-G formula based on Cr of 0.91).  Medical History: Past Medical History  Diagnosis Date  . Anemia   . Anxiety   . Depression   . Diverticulosis   . Elevated cholesterol   . Obesity   . THYROMEGALY 07/22/2010  . Overweight(278.02) 03/11/2010  . Mixed hyperlipidemia 03/11/2010  . MEASLES, HX OF 03/11/2010  . KNEE PAIN, RIGHT 05/28/2010  . HYPERTRIGLYCERIDEMIA 03/11/2010  . HEART MURMUR, HX OF 03/11/2010  . FIBROIDS, UTERUS 03/11/2010  . Diarrhea 08/05/2010  . CONSTIPATION 03/11/2010  . CHICKENPOX, HX OF 03/11/2010  . BIPOLAR AFFECTIVE DISORDER 03/11/2010  . AORTIC VALVE REPLACEMENT, HX OF 03/11/2010  . ANEMIA 05/28/2010  . Acute bronchitis 07/22/2010  . Abdominal pain, generalized 03/19/2010  . Peripheral edema 11/18/2010  . Bacterial vaginitis 11/18/2010  . Hematuria 12/18/2010  . Diverticulitis 12/18/2010  . Hyperthyroidism 12/18/2010  . Fatigue 12/30/2010  . Renal insufficiency 12/30/2010  . Grave's disease 8-12  . Arm lesion 03/23/2011  . UTI (lower urinary tract infection) 08/14/2011  . Breast pain in female 11/17/2011  . Chest pain 11/17/2011  . Hematoma 12/18/2011  . Cervical cancer screening 03/22/2012  . Foot pain, left 03/22/2012  . Foot fracture, left 03/22/2012  . Tick bite of back 10/15/2012  . Preop  examination 02/03/2013  . Dysrhythmia   . Hypertension   . Shortness of breath     with exertion   . Heart murmur   . Urinary tract bacterial infections   . Arthritis    Medications:  Scheduled:   Assessment: 80 yoF to ED with R knee pain (R TKA 02/2013) plan knee aspiration per ortho. Hx of AVR on chronic Warfarin: dose 2.5mg  T,Th,Sa; 5mg  other days, last dose 7/5 at 2200 Baseline INR: 2.55  Goal of Therapy:  Goal INR 2.5-3.5  Monitor platelets by anticoagulation protocol: Yes   Plan:   No aspiration planned per ortho, admit and treat for gout  Continue Warfarin,   Daily PT/INR  Monitor CBC  Minda Ditto PharmD Pager [phone] 11/29/2015, 1:57 PM

## 2015-11-29 NOTE — ED Notes (Signed)
ADMISSION RN PRESENT SPEAKING WITH RN

## 2015-11-29 NOTE — ED Notes (Signed)
Family at bedside. 

## 2015-11-29 NOTE — ED Notes (Signed)
NT at bedside, obtaining blood

## 2015-11-29 NOTE — ED Provider Notes (Signed)
CSN: [id number]     Arrival date & time 11/29/15  0600 History   First MD Initiated Contact with Patient 11/29/15 [phone]     Chief Complaint  Patient presents with  . Knee Pain     (Consider location/radiation/quality/duration/timing/severity/associated sxs/prior Treatment) HPI [name] is a 65 y.o. female with multiple medical problems, presents to ED with complaint of right knee pain. Pt states she has had a right knee replacement 3 years ago. States was doing well up until about a month and a half ago. States for the last 2 weeks pain has been increasing and for the last 2 days, pt has not been able to walk on the leg even with a walker. Pain is sharp. Reports "leg just collapses under me." Also reports swelling in the joint. No injuries. No fever or chills. Taking tylenol for help which is not helping. Denies pain or swelling distal to the knee. No numbness or weakness distal to the knee.   Past Medical History  Diagnosis Date  . Anemia   . Anxiety   . Depression   . Diverticulosis   . Elevated cholesterol   . Obesity   . THYROMEGALY 07/22/2010  . Overweight(278.02) 03/11/2010  . Mixed hyperlipidemia 03/11/2010  . MEASLES, HX OF 03/11/2010  . KNEE PAIN, RIGHT 05/28/2010  . HYPERTRIGLYCERIDEMIA 03/11/2010  . HEART MURMUR, HX OF 03/11/2010  . FIBROIDS, UTERUS 03/11/2010  . Diarrhea 08/05/2010  . CONSTIPATION 03/11/2010  . CHICKENPOX, HX OF 03/11/2010  . BIPOLAR AFFECTIVE DISORDER 03/11/2010  . AORTIC VALVE REPLACEMENT, HX OF 03/11/2010  . ANEMIA 05/28/2010  . Acute bronchitis 07/22/2010  . Abdominal pain, generalized 03/19/2010  . Peripheral edema 11/18/2010  . Bacterial vaginitis 11/18/2010  . Hematuria 12/18/2010  . Diverticulitis 12/18/2010  . Hyperthyroidism 12/18/2010  . Fatigue 12/30/2010  . Renal insufficiency 12/30/2010  . Grave's disease 8-12  . Arm lesion 03/23/2011  . UTI (lower urinary tract infection) 08/14/2011  . Breast pain in female 11/17/2011  . Chest pain  11/17/2011  . Hematoma 12/18/2011  . Cervical cancer screening 03/22/2012  . Foot pain, left 03/22/2012  . Foot fracture, left 03/22/2012  . Tick bite of back 10/15/2012  . Preop examination 02/03/2013  . Dysrhythmia   . Hypertension   . Shortness of breath     with exertion   . Heart murmur   . Urinary tract bacterial infections   . Arthritis    Past Surgical History  Procedure Laterality Date  . Appendectomy      Required revision for EColi infection, required recurrent packing  . Bowel obstruction      Requiring adhesions to be removed  . Aortic valve replacement    . Open heart with a cardiac aneurysm repair    . Cholecystectomy    . Tubal ligation    . Varicose vein surgery b/l legs    . Childhood exploratory surgery of heart    . Hernia repair  08-16-10  . Abdominal hysterectomy  ?1998    sb/l spo, total for fibroids/heavy bleeding  . Total knee arthroplasty Right 02/24/2013    Procedure: RIGHT TOTAL KNEE ARTHROPLASTY;  Surgeon: Johnn Hai, MD;  Location: [facility];  Service: Orthopedics;  Laterality: Right;  . Left heart catheterization with coronary angiogram N/A 12/05/2011    Procedure: LEFT HEART CATHETERIZATION WITH CORONARY ANGIOGRAM;  Surgeon: Sinclair Grooms, MD;  Location: [facility] CATH LAB;  Service: Cardiovascular;  Laterality: N/A;  . Cystocele repair N/A 10/16/2015  Procedure: ANTERIOR REPAIR (CYSTOCELE);  Surgeon: Donnamae Jude, MD;  Location: [facility];  Service: Gynecology;  Laterality: N/A;  . Colonoscopy with propofol N/A 11/01/2015    Procedure: COLONOSCOPY WITH PROPOFOL;  Surgeon: Milus Banister, MD;  Location: [facility];  Service: Endoscopy;  Laterality: N/A;   Family History  Problem Relation Age of Onset  . Scoliosis Mother   . Arthritis Mother     Rheumatoid  . Aneurysm Mother     heart  . Alcohol abuse Father   . Depression Sister   . Depression Brother   . Alcohol abuse Brother   . Cancer Maternal Grandmother     colon  . Diabetes Maternal  Grandfather   . Heart disease Maternal Grandfather   . Alcohol abuse Maternal Grandfather   . Depression Paternal Grandmother   . Diabetes Paternal Grandmother   . Depression Paternal Grandfather   . Diabetes Paternal Grandfather   . Depression Sister   . Alcohol abuse Brother   . Depression Brother   . Heart disease Brother    Social History  Substance Use Topics  . Smoking status: Former Smoker    Quit date: 05/26/1990  . Smokeless tobacco: Never Used  . Alcohol Use: 0.0 oz/week    0 Standard drinks or equivalent per week     Comment: rare   OB History    Gravida Para Term Preterm AB TAB SAB Ectopic Multiple Living   2 2 2       2      Review of Systems  Constitutional: Negative for fever and chills.  Musculoskeletal: Positive for joint swelling and arthralgias.  Neurological: Negative for weakness and numbness.  All other systems reviewed and are negative.     Allergies  Sulfonamide derivatives  Home Medications   Prior to Admission medications   Medication Sig Start Date End Date Taking? Authorizing Provider  albuterol (PROVENTIL HFA;VENTOLIN HFA) 108 (90 BASE) MCG/ACT inhaler Inhale 2 puffs into the lungs every 6 (six) hours as needed for wheezing or shortness of breath. 04/13/15   Debbrah Alar, NP  aspirin EC 81 MG tablet Take 81 mg by mouth daily.    Historical Provider, MD  clonazePAM (KLONOPIN) 0.5 MG tablet Take 0.5 mg by mouth 2 (two) times daily.     Historical Provider, MD  Estradiol 10 MCG TABS vaginal tablet One tablet in vagina daily for 2 weeks then 3 times a week for total of 8 weeks Patient taking differently: Place 10 mcg vaginally See admin instructions. One tablet in vagina daily for 2 weeks then 3 times a week for total of 8 weeks starting on 10/12/15. 10/12/15   Woodroe Mode, MD  furosemide (LASIX) 40 MG tablet Take 1 tablet (40 mg total) by mouth daily. 06/06/15   Belva Crome, MD  glucose blood test strip Use once daily to check blood  sugar.  DX E11.9 04/26/15   Mosie Lukes, MD  lamoTRIgine (LAMICTAL) 200 MG tablet Take 200 mg by mouth at bedtime.    Historical Provider, MD  metoprolol succinate (TOPROL-XL) 25 MG 24 hr tablet TAKE 1 TABLET (25 MG TOTAL) BY MOUTH EVERY MORNING. Patient taking differently: Take 25 mg by mouth daily.  10/12/15   Mosie Lukes, MD  ONE TOUCH LANCETS MISC Test once daily to check blood sugar. DX E11.9 04/26/15   Mosie Lukes, MD  pantoprazole (PROTONIX) 40 MG tablet TAKE 1 TABLET (40 MG TOTAL) BY MOUTH DAILY. 11/23/15  Mosie Lukes, MD  rosuvastatin (CRESTOR) 40 MG tablet TAKE 1 TABLET (40 MG TOTAL) BY MOUTH DAILY. Patient taking differently: Take 40 mg by mouth daily.  09/18/15   Mosie Lukes, MD  SEROQUEL XR 400 MG 24 hr tablet Take 400 mg by mouth at bedtime.  05/30/15   Historical Provider, MD  warfarin (COUMADIN) 5 MG tablet Take 2.5-5 mg by mouth at bedtime. Reported on 10/29/2015    Historical Provider, MD   BP 133/53 mmHg  Pulse 62  Temp(Src) 97.9 F (36.6 C) (Oral)  Resp 15  Ht 5\' 8"  (1.727 m)  Wt 114.306 kg  BMI 38.33 kg/m2  SpO2 100% Physical Exam  Constitutional: She appears well-developed and well-nourished. No distress.  Eyes: Conjunctivae are normal.  Neck: Neck supple.  Musculoskeletal:  Right knee joint effusion present. TTP over entire joint. No erythema or warmth to the touch. Pain with any passive or active ROM. Joint appears stable, but difficult to assess due to pain. No swelling distal to the knee. No calf tenderness. Negative Homan's sign. DP pulses intact.   Neurological: She is alert.  Skin: Skin is warm and dry.  Nursing note and vitals reviewed.   ED Course  Procedures (including critical care time) Labs Review Labs Reviewed - No data to display  Imaging Review No results found. I have personally reviewed and evaluated these images and lab results as part of my medical decision-making.   EKG Interpretation None      MDM   Final diagnoses:   Knee effusion, right   6:41 AM  Pt seen and examined. Pt in ED with right knee pain and swelling. No trauma. Knee replacement 3  Years ago. Will start with xray and percocet for pain. No signs of joint infection.   9:46 AM Pt's xray negative other than large joint effusion. Tried multiple times to page orthopedics, apparently, Dr. Tonita Cong is in surgery, Dr. Cay Schillings wanted pt to be seen in the office at 1:30. This was discussed through [place]. Pt however was not able to ambulate with assitance and a walker. Daughter concerned that pt is going to fall. Pt is on coumadin. Pt has stairs going into the house. Pt states pain is worse than it was right after knee replacement surgery. Discussed again with connie, asked for orthopedics consult in ER.   Patient was seen by Dr. Tonita Cong. Who consults it on her case. Patient admitted to the hospitalist.  Filed Vitals:   11/29/15 1248 11/29/15 1300 11/29/15 1330 11/29/15 1405  BP: 121/65 120/71 124/61 162/68  Pulse: 78 67 66 57  Temp:  98 F (36.7 C)  98.1 F (36.7 C)  TempSrc:  Oral  Oral  Resp: 16 18 18 20   Height:      Weight:      SpO2: 97% 100% 96% 98%     Jeannett Senior, PA-C [id number]  David Glick, MD [id number]

## 2015-11-30 DIAGNOSIS — M25461 Effusion, right knee: Secondary | ICD-10-CM | POA: Diagnosis not present

## 2015-11-30 DIAGNOSIS — M25061 Hemarthrosis, right knee: Secondary | ICD-10-CM | POA: Diagnosis not present

## 2015-11-30 DIAGNOSIS — T8189XA Other complications of procedures, not elsewhere classified, initial encounter: Secondary | ICD-10-CM | POA: Diagnosis not present

## 2015-11-30 LAB — PROTIME-INR
INR: 2.55 — ABNORMAL HIGH (ref 0.00–1.49)
Prothrombin Time: 27.1 seconds — ABNORMAL HIGH (ref 11.6–15.2)

## 2015-11-30 LAB — BASIC METABOLIC PANEL
Anion gap: 7 (ref 5–15)
BUN: 12 mg/dL (ref 6–20)
CO2: 24 mmol/L (ref 22–32)
Calcium: 9.1 mg/dL (ref 8.9–10.3)
Chloride: 106 mmol/L (ref 101–111)
Creatinine, Ser: 0.68 mg/dL (ref 0.44–1.00)
GFR calc Af Amer: >60 mL/min (ref >60)
GFR calc non Af Amer: >60 mL/min (ref >60)
Glucose, Bld: 155 mg/dL — ABNORMAL HIGH (ref 65–99)
Potassium: 4.6 mmol/L (ref 3.5–5.1)
Sodium: 137 mmol/L (ref 135–145)

## 2015-11-30 LAB — CBC
HCT: 35 % — ABNORMAL LOW (ref 36.0–46.0)
Hemoglobin: 11.4 g/dL — ABNORMAL LOW (ref 12.0–15.0)
MCH: 29.7 pg (ref 26.0–34.0)
MCHC: 32.6 g/dL (ref 30.0–36.0)
MCV: 91.1 fL (ref 78.0–100.0)
Platelets: 221 10*3/uL (ref 150–400)
RBC: 3.84 MIL/uL — ABNORMAL LOW (ref 3.87–5.11)
RDW: 14.2 % (ref 11.5–15.5)
WBC: 7.9 10*3/uL (ref 4.0–10.5)

## 2015-11-30 MED ORDER — WARFARIN SODIUM 5 MG PO TABS
5.0000 mg | ORAL_TABLET | Freq: Once | ORAL | Status: AC
  Administered 2015-11-30: 5 mg via ORAL
  Filled 2015-11-30: qty 1

## 2015-11-30 NOTE — Progress Notes (Signed)
PROGRESS NOTE    [name]  [id number] DOB: 1951-02-02 DOA: 11/29/2015 PCP: Penni Homans, MD   Brief Narrative:  65 y.o. female with medical history significant of Aortic Mechanical valve on coumadin, recent admission for vaginal bleed found to have vaginal ulcer, no further bleeding, right knee replacement 2014 who presents complaining of right knee pain that started 1 to 2 month ago.   Assessment & Plan:   Principal Problem:   Effusion of right knee -Orthopedic surgeon on board. Had long discussion with daughter and patient and answered their questions to their satisfaction. - Effusion could be secondary to supratherapeutic INR levels or could be secondary to gout flare. Currently improving on colchicine as such suspect most likely a component of gout.  Active Problems:   Hx of mechanical aortic valve replacement -Will need to continue Coumadin to protect against strokes. Patient high risk secondary to mechanical heart valve.   DVT prophylaxis: On anticoagulation, Coumadin Code Status: full Family Communication: d/c daughter after obtaining patient's permission Disposition Plan: Most likely discharge next a.m. For today will try pain control   Consultants:   Orthopedic surgery   Procedures: None   Antimicrobials: None   Subjective: Patient has no new complaints. No acute issues overnight. States that she can move her leg better today  Objective: Filed Vitals:   11/29/15 1407 11/29/15 2122 11/30/15 0525 11/30/15 1400  BP: 137/82 158/74 133/61 130/52  Pulse:  61 54 59  Temp:  98.5 F (36.9 C) 97.7 F (36.5 C) 98.4 F (36.9 C)  TempSrc:  Oral Oral Oral  Resp:  18 18 18   Height:      Weight:      SpO2:  95% 97% 96%    Intake/Output Summary (Last 24 hours) at 11/30/15 1659 Last data filed at 11/29/15 1749  Gross per 24 hour  Intake      0 ml  Output    300 ml  Net   -300 ml   Filed Weights   11/29/15 0600  Weight: 114.306 kg (252 lb)     Examination:  General exam: Appears calm and comfortable  Respiratory system: Clear to auscultation. Respiratory effort normal. Cardiovascular system: S1 & S2 heard, RRR. No JVD, murmurs, rubs, gallops or clicks. No pedal edema. Gastrointestinal system: Abdomen is nondistended, soft and nontender. No organomegaly or masses felt. Normal bowel sounds heard. Central nervous system: Alert and oriented. No focal neurological deficits. Extremities: Equal tone bilaterally. Skin: No rashes, lesions or ulcers. Edema over right knee. No cellulitis Psychiatry: Judgement and insight appear normal. Mood & affect appropriate.     Data Reviewed: I have personally reviewed following labs and imaging studies  CBC:  Recent Labs Lab 11/29/15 0944 11/30/15 0444  WBC 7.5 7.9  NEUTROABS 5.1  --   HGB 11.7* 11.4*  HCT 34.3* 35.0*  MCV 89.3 91.1  PLT 217 [id number]   Basic Metabolic Panel:  Recent Labs Lab 11/29/15 0944 11/30/15 0444  NA 139 137  K 4.3 4.6  CL 103 106  CO2 28 24  GLUCOSE 129* 155*  BUN 15 12  CREATININE 0.91 0.68  CALCIUM 9.3 9.1   GFR: Estimated Creatinine Clearance: 93.1 mL/min (by C-G formula based on Cr of 0.68). Liver Function Tests: No results for input(s): AST, ALT, ALKPHOS, BILITOT, PROT, ALBUMIN in the last 168 hours. No results for input(s): LIPASE, AMYLASE in the last 168 hours. No results for input(s): AMMONIA in the last 168 hours. Coagulation Profile:  Recent Labs  Lab 11/29/15 1040 11/30/15 0444  INR 2.55* 2.55*   Cardiac Enzymes: No results for input(s): CKTOTAL, CKMB, CKMBINDEX, TROPONINI in the last 168 hours. BNP (last 3 results) No results for input(s): PROBNP in the last 8760 hours. HbA1C: No results for input(s): HGBA1C in the last 72 hours. CBG: No results for input(s): GLUCAP in the last 168 hours. Lipid Profile: No results for input(s): CHOL, HDL, LDLCALC, TRIG, CHOLHDL, LDLDIRECT in the last 72 hours. Thyroid Function Tests: No  results for input(s): TSH, T4TOTAL, FREET4, T3FREE, THYROIDAB in the last 72 hours. Anemia Panel: No results for input(s): VITAMINB12, FOLATE, FERRITIN, TIBC, IRON, RETICCTPCT in the last 72 hours. Sepsis Labs: No results for input(s): PROCALCITON, LATICACIDVEN in the last 168 hours.  No results found for this or any previous visit (from the past 240 hour(s)).       Radiology Studies: Dg Knee Complete 4 Views Right  11/29/2015  CLINICAL DATA:  Status post fall, with right knee pain and swelling. Initial encounter. EXAM: RIGHT KNEE - COMPLETE 4+ VIEW COMPARISON:  Right knee radiographs performed 02/24/2013 FINDINGS: There is no evidence of fracture or dislocation. The patient's total knee arthroplasty appears grossly intact, without evidence of loosening. A fabella is noted. A large knee joint effusion is noted. IMPRESSION: 1. No evidence of fracture or dislocation. Total knee arthroplasty appears grossly intact, without evidence of loosening. 2. Large knee joint effusion noted. Electronically Signed   By: Garald Balding M.D.   On: 11/29/2015 07:07        Scheduled Meds: . acidophilus  1 capsule Oral Daily  . clonazePAM  0.5 mg Oral BID  . colchicine  0.6 mg Oral Daily  . Estradiol  10 mcg Vaginal Q M,W,F  . fenofibrate  160 mg Oral Daily  . furosemide  40 mg Oral Daily  . metoprolol succinate  25 mg Oral q morning - 10a  . pantoprazole  40 mg Oral Daily  . predniSONE  40 mg Oral Q breakfast  . QUEtiapine  400 mg Oral QHS  . rosuvastatin  40 mg Oral Daily  . sodium chloride flush  3 mL Intravenous Q12H  . warfarin  5 mg Oral ONCE-1800  . Warfarin - Pharmacist Dosing Inpatient   Does not apply q1800   Continuous Infusions:    LOS: 1 day    Time spent: > 35 minutes    Velvet Bathe, MD [facility] Pager [phone]  If 7PM-7AM, please contact night-coverage [email or web address] Password [email or web address] 11/30/2015, 4:59 PM

## 2015-11-30 NOTE — Progress Notes (Signed)
Subjective: R knee effusion  2.5 yrs s/p R TKA by Dr. Tonita Cong Reports still painful with ROM and weightbearing Has not received pain meds since yesterday in evening  Objective: Vital signs in last 24 hours: Temp:  [97.7 F (36.5 C)-98.5 F (36.9 C)] 97.7 F (36.5 C) (07/07 0525) Pulse Rate:  [54-78] 54 (07/07 0525) Resp:  [15-20] 18 (07/07 0525) BP: (120-162)/(54-82) 133/61 mmHg (07/07 0525) SpO2:  [95 %-100 %] 97 % (07/07 0525)  Intake/Output from previous day: 07/06 0701 - 07/07 0700 In: -  Out: 300 [Urine:300] Intake/Output this shift:     Recent Labs  11/29/15 0944 11/30/15 0444  HGB 11.7* 11.4*    Recent Labs  11/29/15 0944 11/30/15 0444  WBC 7.5 7.9  RBC 3.84* 3.84*  HCT 34.3* 35.0*  PLT 217 221    Recent Labs  11/29/15 0944 11/30/15 0444  NA 139 137  K 4.3 4.6  CL 103 106  CO2 28 24  BUN 15 12  CREATININE 0.91 0.68  GLUCOSE 129* 155*  CALCIUM 9.3 9.1    Recent Labs  11/29/15 1040 11/30/15 0444  INR 2.55* 2.55*    Neurologically intact ABD soft Neurovascular intact Sensation intact distally Intact pulses distally Dorsiflexion/Plantar flexion intact No cellulitis present Compartment soft R knee effusion  No increased warmth, erythema or sign of infx  Assessment/Plan: R knee effusion- suspect hemarthrosis given her hypercoagulability on coumadin  Recommend ice, elevation, immobilizer prn Given that her CRP and ESR show no signs of inflammation, WBC is WNL and she is afebrile, no indication for aspiration of the knee at this point, no concern for infx Could consider PO steroid as her uric acid is elevated. No hx of gout- but there is family hx (brother) Will discuss with Dr. Mliss Fritz, [place] Rolls. 11/30/2015, 8:14 AM

## 2015-11-30 NOTE — Progress Notes (Signed)
[place] for Warfarin Indication: chronic anti-coag for AVR  Allergies  Allergen Reactions  . Sulfonamide Derivatives Other (See Comments)    headaches   Patient Measurements: Height: 5\' 8"  (172.7 cm) Weight: 252 lb (114.306 kg) IBW/kg (Calculated) : 63.9  Vital Signs: Temp: 97.7 F (36.5 C) (07/07 0525) Temp Source: Oral (07/07 0525) BP: 133/61 mmHg (07/07 0525) Pulse Rate: 54 (07/07 0525)  Labs:  Recent Labs  11/29/15 0944 11/29/15 1040 11/30/15 0444  HGB 11.7*  --  11.4*  HCT 34.3*  --  35.0*  PLT 217  --  221  LABPROT  --  27.1* 27.1*  INR  --  2.55* 2.55*  CREATININE 0.91  --  0.68   Estimated Creatinine Clearance: 93.1 mL/min (by C-G formula based on Cr of 0.68).   Assessment: 79 yoF to ED with R knee pain (R TKA 02/2013) plan knee aspiration per ortho. Hx of AVR on chronic Warfarin: dose 2.5mg  T,Th,Sa; 5mg  other days, last dose 7/5 at 2200. Baseline INR: 2.55    Today, 11/30/2015: Hgb stable, Plt wnl No overt bleeding; PA notes possible hemarthrosis but no concern for infxn and no plans for aspiration.  Surgery OK to continue warfarin.   Goal of Therapy:  Goal INR 2.5-3.5  Monitor platelets by anticoagulation protocol: Yes   Plan:   Warfarin 5 mg PO tonight at 1800  Daily PT/INR  Monitor CBC  Reuel Boom, PharmD, BCPS Pager: [phone] 11/30/2015, 9:10 AM

## 2015-11-30 NOTE — Evaluation (Signed)
Physical Therapy Evaluation Patient Details Name: [name] MRN: [id number] DOB: 02/23/1951 Today's Date: 11/30/2015   History of Present Illness  Pt admitted with R knee pain and swelling and history of R TKR, Graves disease, Aortic valve replacement and Bipolar  Clinical Impression  Pt admitted as above and presenting with functional mobility limitations 2* R LE decreased strength/ROM and pain.  Pt reports pain is markedly better than yesterday and pt should progress to dc home with family assist.    Follow Up Recommendations No PT follow up    Equipment Recommendations  None recommended by PT    Recommendations for Other Services       Precautions / Restrictions Precautions Precautions: Fall Required Braces or Orthoses: Knee Immobilizer - Right Knee Immobilizer - Right: On when out of bed or walking;Discontinue once straight leg raise with < 10 degree lag Restrictions Weight Bearing Restrictions: No RLE Weight Bearing: Weight bearing as tolerated      Mobility  Bed Mobility Overal bed mobility: Needs Assistance Bed Mobility: Supine to Sit     Supine to sit: Min guard     General bed mobility comments: min guard for R LE  Transfers Overall transfer level: Needs assistance Equipment used: Rolling walker (2 wheeled) Transfers: Sit to/from Stand Sit to Stand: Min assist         General transfer comment: cues for LE management and use of UEs to self assist  Ambulation/Gait Ambulation/Gait assistance: Min assist;Min guard Ambulation Distance (Feet): 222 Feet (and to/from bathroom) Assistive device: Rolling walker (2 wheeled) Gait Pattern/deviations: Step-to pattern;Step-through pattern;Shuffle;Trunk flexed Gait velocity: decr Gait velocity interpretation: Below normal speed for age/gender General Gait Details: cues for posture, position from RW, initial sequence and progression to recip gait  Stairs            Wheelchair Mobility    Modified Rankin  (Stroke Patients Only)       Balance                                             Pertinent Vitals/Pain Pain Assessment: 0-10 Pain Score: 3  Pain Location: R knee Pain Descriptors / Indicators: Aching;Sore Pain Intervention(s): Limited activity within patient's tolerance;Monitored during session;Ice applied    Home Living Family/patient expects to be discharged to:: Private residence Living Arrangements: Spouse/significant other Available Help at Discharge: Family Type of Home: Apartment Home Access: Level entry     Home Layout: One level Home Equipment: Environmental consultant - 2 wheels;Bedside commode;Cane - single point      Prior Function Level of Independence: Independent               Hand Dominance        Extremity/Trunk Assessment   Upper Extremity Assessment: Overall WFL for tasks assessed           Lower Extremity Assessment: RLE deficits/detail RLE Deficits / Details: 2+/5 quads with AROM at knee -8 - 25    Cervical / Trunk Assessment: Normal  Communication   Communication: No difficulties  Cognition Arousal/Alertness: Awake/alert Behavior During Therapy: WFL for tasks assessed/performed Overall Cognitive Status: Within Functional Limits for tasks assessed                      General Comments      Exercises General Exercises - Lower Extremity Ankle Circles/Pumps: AROM;Both;15 reps;Supine  Assessment/Plan    PT Assessment Patient needs continued PT services  PT Diagnosis Difficulty walking   PT Problem List Decreased strength;Decreased range of motion;Decreased activity tolerance;Decreased mobility;Decreased knowledge of use of DME;Obesity;Pain  PT Treatment Interventions DME instruction;Gait training;Functional mobility training;Therapeutic activities;Therapeutic exercise;Patient/family education   PT Goals (Current goals can be found in the Care Plan section) Acute Rehab PT Goals Patient Stated Goal: Regain IND  and ambulate without pain PT Goal Formulation: With patient Time For Goal Achievement: 12/07/15 Potential to Achieve Goals: Good    Frequency 7X/week   Barriers to discharge        Co-evaluation               End of Session Equipment Utilized During Treatment: Gait belt;Right knee immobilizer Activity Tolerance: Patient tolerated treatment well Patient left: in chair;with call bell/phone within reach;with chair alarm set Nurse Communication: Mobility status    Functional Assessment Tool Used: Clinical judgement Functional Limitation: Mobility: Walking and moving around Mobility: Walking and Moving Around Current Status [id number]): At least 1 percent but less than 20 percent impaired, limited or restricted Mobility: Walking and Moving Around Goal Status [phone]): At least 1 percent but less than 20 percent impaired, limited or restricted    Time: 0940-1011 PT Time Calculation (min) (ACUTE ONLY): 31 min   Charges:   PT Evaluation $PT Eval Low Complexity: 1 Procedure PT Treatments $Gait Training: 8-22 mins   PT G Codes:   PT G-Codes **NOT FOR INPATIENT CLASS** Functional Assessment Tool Used: Clinical judgement Functional Limitation: Mobility: Walking and moving around Mobility: Walking and Moving Around Current Status [id number]): At least 1 percent but less than 20 percent impaired, limited or restricted Mobility: Walking and Moving Around Goal Status [phone]): At least 1 percent but less than 20 percent impaired, limited or restricted    Britne Borelli 11/30/2015, 12:44 PM

## 2015-12-01 DIAGNOSIS — M25461 Effusion, right knee: Secondary | ICD-10-CM | POA: Diagnosis not present

## 2015-12-01 DIAGNOSIS — T8189XA Other complications of procedures, not elsewhere classified, initial encounter: Secondary | ICD-10-CM | POA: Diagnosis not present

## 2015-12-01 LAB — PROTIME-INR
INR: 3.15 — ABNORMAL HIGH (ref 0.00–1.49)
Prothrombin Time: 31.7 seconds — ABNORMAL HIGH (ref 11.6–15.2)

## 2015-12-01 MED ORDER — WARFARIN SODIUM 2.5 MG PO TABS: 2.5000 mg | ORAL_TABLET | Freq: Once | ORAL | Status: DC

## 2015-12-01 MED ORDER — PREDNISONE 20 MG PO TABS: 40.0000 mg | ORAL_TABLET | Freq: Every day | ORAL | Status: DC

## 2015-12-01 MED ORDER — COLCHICINE 0.6 MG PO TABS: 0.6000 mg | ORAL_TABLET | Freq: Every day | ORAL | Status: DC

## 2015-12-01 MED ORDER — OXYCODONE HCL 5 MG PO TABS: 5.0000 mg | ORAL_TABLET | ORAL | Status: DC | PRN

## 2015-12-01 NOTE — Progress Notes (Signed)
Physical Therapy Treatment Patient Details Name: [name] MRN: [id number] DOB: 03-Apr-1951 Today's Date: 12/01/2015    History of Present Illness Pt admitted with R knee pain and swelling and history of R TKR, Graves disease, Aortic valve replacement and Bipolar    PT Comments    Pt progressing with mobility and able to perform IND R SLR and ambulate sans KI with min discomfort.   Follow Up Recommendations  No PT follow up     Equipment Recommendations  None recommended by PT    Recommendations for Other Services       Precautions / Restrictions Precautions Precautions: Fall Required Braces or Orthoses: Knee Immobilizer - Right Knee Immobilizer - Right: On when out of bed or walking;Discontinue once straight leg raise with < 10 degree lag (Pt performed IND SLR this am) Restrictions Weight Bearing Restrictions: No RLE Weight Bearing: Weight bearing as tolerated    Mobility  Bed Mobility Overal bed mobility: Modified Independent Bed Mobility: Supine to Sit           General bed mobility comments: Pt to EOB unassisted  Transfers Overall transfer level: Needs assistance Equipment used: Rolling walker (2 wheeled) Transfers: Sit to/from Stand Sit to Stand: Supervision         General transfer comment: cues for LE management and use of UEs to self assist  Ambulation/Gait Ambulation/Gait assistance: Min guard;Supervision Ambulation Distance (Feet): 222 Feet (and 15' to/from bathroom) Assistive device: Rolling walker (2 wheeled) Gait Pattern/deviations: Step-to pattern;Step-through pattern;Shuffle;Trunk flexed Gait velocity: decr Gait velocity interpretation: Below normal speed for age/gender General Gait Details: cues for posture, position from RW, initial sequence and progression to recip gait   Stairs            Wheelchair Mobility    Modified Rankin (Stroke Patients Only)       Balance                                     Cognition Arousal/Alertness: Awake/alert Behavior During Therapy: WFL for tasks assessed/performed Overall Cognitive Status: Within Functional Limits for tasks assessed                      Exercises      General Comments        Pertinent Vitals/Pain Pain Assessment: 0-10 Pain Score: 2  Pain Location: R knee Pain Descriptors / Indicators: Aching;Sore Pain Intervention(s): Limited activity within patient's tolerance;Monitored during session;Ice applied    Home Living                      Prior Function            PT Goals (current goals can now be found in the care plan section) Acute Rehab PT Goals Patient Stated Goal: Regain IND and ambulate without pain PT Goal Formulation: With patient Time For Goal Achievement: 12/07/15 Potential to Achieve Goals: Good Progress towards PT goals: Progressing toward goals    Frequency  7X/week    PT Plan Current plan remains appropriate    Co-evaluation             End of Session Equipment Utilized During Treatment: Gait belt Activity Tolerance: Patient tolerated treatment well Patient left: in chair;with call bell/phone within reach;with chair alarm set     Time: [id number] PT Time Calculation (min) (ACUTE ONLY): 26 min  Charges:  $Gait Training:  8-22 mins                    G Codes:      Eupha Lobb 03-Dec-2015, 12:17 PM

## 2015-12-01 NOTE — Progress Notes (Signed)
Pt left at this time with her daughter. Alert, oriented, and without c/o. Discharge instructions/prescriptions given/explained with pt verbalizing understanding. Pt left with glasses and immobilizer.

## 2015-12-01 NOTE — Care Management Note (Signed)
Case Management Note  Patient Details  Name: [name] MRN: [id number] Date of Birth: 1951-01-03  Subjective/Objective:     Effusion of right knee               Action/Plan: Discharge Planning: AVS reviewed: NCM spoke to pt at bedside. Has RW and 3n1 at home. Husband at home to assist with care. She can afford medications at home. No NCM needs identified.   PCP- Mosie Lukes MD   Expected Discharge Date:  12/01/2015           Expected Discharge Plan:  Home/Self Care  In-House Referral:  NA  Discharge planning Services  CM Consult  Post Acute Care Choice:  NA Choice offered to:  NA  DME Arranged:  N/A DME Agency:  NA  HH Arranged:  NA HH Agency:  NA  Status of Service:  Completed, signed off  If discussed at [place] of Stay Meetings, dates discussed:    Additional Comments:  Erenest Rasher, RN 12/01/2015, 2:24 PM

## 2015-12-01 NOTE — Discharge Summary (Signed)
Physician Discharge Summary  [name] [id number] DOB: 12-Jan-1951 DOA: 11/29/2015  PCP: Penni Homans, MD  Admit date: 11/29/2015 Discharge date: 12/01/2015  Time spent: > 35 minutes  Recommendations for Outpatient Follow-up:  1. Monitor INR levels 2. Patient symptoms suspected to be secondary to gout. Will discharge on colchicine and short course of prednisone   Discharge Diagnoses:  Principal Problem:   Effusion of right knee Active Problems:   Hx of mechanical aortic valve replacement   Knee effusion    Discharge Condition: Stable  Diet recommendation: Heart healthy  Filed Weights   11/29/15 0600  Weight: 114.306 kg (252 lb)    History of present illness:  65 y.o. female with medical history significant of Aortic Mechanical valve on coumadin, recent admission for vaginal bleed found to have vaginal ulcer, no further bleeding, right knee replacement 2014 who presents complaining of right knee pain that started 1 to 2 month ago. The knee pain has been getting progressively worse. She was supposed to follow up with her orthopedic yesterday but had to cancel appointment due to family events.  Hospital Course:  Principal Problem:  Effusion of right knee -Orthopedic surgeon on board and currently not recommending any surgical intervention. Had long discussion with daughter and patient and answered their questions to their satisfaction. - Effusion could be secondary to supratherapeutic INR levels or could be secondary to gout flare. Currently improving on colchicine as such suspect most likely a component of gout. - We'll continue short course of prednisone on discharge. Also discharge on colchicine. Recommended patient follow-up with primary care physician  Active Problems:  Hx of mechanical aortic valve replacement -Will need to continue Coumadin to protect against strokes. Patient high risk secondary to mechanical heart  valve.  Procedures:  None  Consultations:  Orthopedic surgery  Discharge Exam: Filed Vitals:   11/30/15 2151 12/01/15 0510  BP: 153/75 157/73  Pulse: 59 52  Temp: 98.2 F (36.8 C) 97.8 F (36.6 C)  Resp: 17 17    General: Pt in nad, alert and awake Cardiovascular: rrr, no rubs Respiratory: CTA BL, no wheezes  Discharge Instructions   Discharge Instructions    Call MD for:  redness, tenderness, or signs of infection (pain, swelling, redness, odor or green/yellow discharge around incision site)    Complete by:  As directed      Call MD for:  severe uncontrolled pain    Complete by:  As directed      Call MD for:  temperature >100.4    Complete by:  As directed      Diet - low sodium heart healthy    Complete by:  As directed      Discharge instructions    Complete by:  As directed   Please be sure to follow up with your pcp or orthopaedic physician within the next 1-2 wks.     Increase activity slowly    Complete by:  As directed           Current Discharge Medication List    START taking these medications   Details  colchicine 0.6 MG tablet Take 1 tablet (0.6 mg total) by mouth daily. Qty: 30 tablet, Refills: 0    oxyCODONE (OXY IR/ROXICODONE) 5 MG immediate release tablet Take 1 tablet (5 mg total) by mouth every 4 (four) hours as needed for moderate pain. Qty: 30 tablet, Refills: 0    predniSONE (DELTASONE) 20 MG tablet Take 2 tablets (40 mg total) by mouth daily  with breakfast. Qty: 3 tablet, Refills: 0      CONTINUE these medications which have NOT CHANGED   Details  acetaminophen (TYLENOL) 500 MG tablet Take 1,000-1,500 mg by mouth every 6 (six) hours as needed (For knee pain.).    aspirin EC 81 MG tablet Take 81 mg by mouth daily.    clonazePAM (KLONOPIN) 0.5 MG tablet Take 0.5 mg by mouth 2 (two) times daily.     cyclobenzaprine (FLEXERIL) 10 MG tablet Take 10 mg by mouth 3 (three) times daily as needed for muscle spasms.    Estradiol 10 MCG  TABS vaginal tablet One tablet in vagina daily for 2 weeks then 3 times a week for total of 8 weeks Qty: 30 tablet, Refills: 2   Associated Diagnoses: Abnormal vaginal bleeding    fenofibrate 160 MG tablet Take 160 mg by mouth daily.    furosemide (LASIX) 40 MG tablet Take 1 tablet (40 mg total) by mouth daily. Qty: 30 tablet, Refills: 11    glucose blood test strip Use once daily to check blood sugar.  DX E11.9 Qty: 100 each, Refills: 3    lamoTRIgine (LAMICTAL) 200 MG tablet Take 200 mg by mouth at bedtime.    metoprolol succinate (TOPROL-XL) 25 MG 24 hr tablet TAKE 1 TABLET (25 MG TOTAL) BY MOUTH EVERY MORNING. Qty: 30 tablet, Refills: 4    ONE TOUCH LANCETS MISC Test once daily to check blood sugar. DX E11.9 Qty: 100 each, Refills: 3    pantoprazole (PROTONIX) 40 MG tablet TAKE 1 TABLET (40 MG TOTAL) BY MOUTH DAILY. Qty: 30 tablet, Refills: 5    Polyethyl Glycol-Propyl Glycol (SYSTANE OP) Place 1 drop into both eyes at bedtime as needed (For dry eyes.).     Probiotic Product (ALIGN) 4 MG CAPS Take 4 mg by mouth daily.    rosuvastatin (CRESTOR) 40 MG tablet TAKE 1 TABLET (40 MG TOTAL) BY MOUTH DAILY. Qty: 30 tablet, Refills: 6    SEROQUEL XR 400 MG 24 hr tablet Take 400 mg by mouth at bedtime.  Refills: 1    venlafaxine XR (EFFEXOR-XR) 150 MG 24 hr capsule Take 150 mg by mouth every morning.    warfarin (COUMADIN) 5 MG tablet Take 2.5-5 mg by mouth at bedtime. Take half a tablet (2.5mg ) on Tuesday, Thursday, Saturday and one tablet (5mg ) the rest of the week.       Allergies  Allergen Reactions  . Sulfonamide Derivatives Other (See Comments)    headaches      The results of significant diagnostics from this hospitalization (including imaging, microbiology, ancillary and laboratory) are listed below for reference.    Significant Diagnostic Studies: Dg Knee Complete 4 Views Right  11/29/2015  CLINICAL DATA:  Status post fall, with right knee pain and swelling.  Initial encounter. EXAM: RIGHT KNEE - COMPLETE 4+ VIEW COMPARISON:  Right knee radiographs performed 02/24/2013 FINDINGS: There is no evidence of fracture or dislocation. The patient's total knee arthroplasty appears grossly intact, without evidence of loosening. A fabella is noted. A large knee joint effusion is noted. IMPRESSION: 1. No evidence of fracture or dislocation. Total knee arthroplasty appears grossly intact, without evidence of loosening. 2. Large knee joint effusion noted. Electronically Signed   By: Garald Balding M.D.   On: 11/29/2015 07:07    Microbiology: No results found for this or any previous visit (from the past 240 hour(s)).   Labs: Basic Metabolic Panel:  Recent Labs Lab 11/29/15 0944 11/30/15 0444  NA  139 137  K 4.3 4.6  CL 103 106  CO2 28 24  GLUCOSE 129* 155*  BUN 15 12  CREATININE 0.91 0.68  CALCIUM 9.3 9.1   Liver Function Tests: No results for input(s): AST, ALT, ALKPHOS, BILITOT, PROT, ALBUMIN in the last 168 hours. No results for input(s): LIPASE, AMYLASE in the last 168 hours. No results for input(s): AMMONIA in the last 168 hours. CBC:  Recent Labs Lab 11/29/15 0944 11/30/15 0444  WBC 7.5 7.9  NEUTROABS 5.1  --   HGB 11.7* 11.4*  HCT 34.3* 35.0*  MCV 89.3 91.1  PLT 217 221   Cardiac Enzymes: No results for input(s): CKTOTAL, CKMB, CKMBINDEX, TROPONINI in the last 168 hours. BNP: BNP (last 3 results) No results for input(s): BNP in the last 8760 hours.  ProBNP (last 3 results) No results for input(s): PROBNP in the last 8760 hours.  CBG: No results for input(s): GLUCAP in the last 168 hours.  Signed:  Velvet Bathe MD.  [facility] 12/01/2015, 12:54 PM

## 2015-12-01 NOTE — Progress Notes (Signed)
    Subjective:     Patient reports pain as 3 on 0-10 scale.   Denies CP or SOB.  Voiding without difficulty. Positive flatus. Positive BM. No nausea. Pt reported walking with PT yesterday going well, but once returned to her bed her knee "locking up" Objective: Vital signs in last 24 hours: Temp:  [97.8 F (36.6 C)-98.4 F (36.9 C)] 97.8 F (36.6 C) (07/08 0510) Pulse Rate:  [52-59] 52 (07/08 0510) Resp:  [17-18] 17 (07/08 0510) BP: (130-157)/(52-75) 157/73 mmHg (07/08 0510) SpO2:  [96 %-99 %] 99 % (07/08 0510)  Intake/Output from previous day: 07/07 0701 - 07/08 0700 In: 360 [P.O.:360] Out: -  Intake/Output this shift:    Labs:  Recent Labs  11/29/15 0944 11/30/15 0444  HGB 11.7* 11.4*    Recent Labs  11/29/15 0944 11/30/15 0444  WBC 7.5 7.9  RBC 3.84* 3.84*  HCT 34.3* 35.0*  PLT 217 221    Recent Labs  11/29/15 0944 11/30/15 0444  NA 139 137  K 4.3 4.6  CL 103 106  CO2 28 24  BUN 15 12  CREATININE 0.91 0.68  GLUCOSE 129* 155*  CALCIUM 9.3 9.1    Recent Labs  11/30/15 0444 12/01/15 0458  INR 2.55* 3.15*    Physical Exam: Neurologically intact ABD soft Sensation intact distally Intact pulses distally Dorsiflexion/Plantar flexion intact Edema of right knee TTP right knee Pain with weight bearing  Assessment/Plan:     Up with therapy  Continue to be managed by Dr. Linward Foster - managing INR/Gout flare   [place], Darla Lesches for Dr. Melina Schools [facility] [phone] 12/01/2015, 7:30 AM

## 2015-12-01 NOTE — Care Management Obs Status (Signed)
[place] NOTIFICATION   Patient Details  Name: [name] MRN: [id number] Date of Birth: 01-04-1951   Medicare Observation Status Notification Given:  Yes    Erenest Rasher, RN 12/01/2015, 2:23 PM

## 2015-12-01 NOTE — Progress Notes (Signed)
[place] for Warfarin Indication: chronic anti-coag for AVR  Allergies  Allergen Reactions  . Sulfonamide Derivatives Other (See Comments)    headaches   Patient Measurements: Height: 5\' 8"  (172.7 cm) Weight: 252 lb (114.306 kg) IBW/kg (Calculated) : 63.9  Vital Signs: Temp: 97.8 F (36.6 C) (07/08 0510) Temp Source: Oral (07/08 0510) BP: 157/73 mmHg (07/08 0510) Pulse Rate: 52 (07/08 0510)  Labs:  Recent Labs  11/29/15 0944 11/29/15 1040 11/30/15 0444 12/01/15 0458  HGB 11.7*  --  11.4*  --   HCT 34.3*  --  35.0*  --   PLT 217  --  221  --   LABPROT  --  27.1* 27.1* 31.7*  INR  --  2.55* 2.55* 3.15*  CREATININE 0.91  --  0.68  --    Estimated Creatinine Clearance: 93.1 mL/min (by C-G formula based on Cr of 0.68).   Assessment: 89 yoF to ED with R knee pain (R TKA 02/2013) plan knee aspiration per ortho. Hx of AVR on chronic Warfarin: dose 2.5mg  T,Th,Sa; 5mg  other days, last dose 7/5 at 2200. Baseline INR: 2.55    Today, 12/01/2015: INR 3.15, therapeutic Hgb stable, Plt wnl No overt bleeding; PA notes possible hemarthrosis but no concern for infxn and no plans for aspiration.  Surgery OK to continue warfarin.   Goal of Therapy:  Goal INR 2.5-3.5  Monitor platelets by anticoagulation protocol: Yes   Plan:   Warfarin 2.5 mg PO tonight at 1800  Daily PT/INR  Monitor CBC  Dolly Rias RPh 12/01/2015, 9:06 AM Pager [phone]

## 2015-12-03 ENCOUNTER — Telehealth: Payer: Self-pay

## 2015-12-03 NOTE — Telephone Encounter (Signed)
Transition Care Management Follow-up Telephone Call  ADMISSION DATE: 11/29/2015  DISCHARGE DATE: 12/01/2015   How have you been since you were released from the hospital? Patient states she has been feeling fine since discharge.     Do you understand why you were in the hospital? YES     Do you understand the discharge instrcutions?Patient states she doesn't remember getting discharge instructions. Offered to mail her copy,states she will just pick up a copy when she comes in for appointment. Reviewed content of Discharge Summary   Items Reviewed:  Medications reviewed:  Reviewed medications with patient.  Allergies reviewed:Sulfonomide Derivetives causes her to have headaches.  Dietary changes reviewed:Low Sodium Heart Healthy  Referrals reviewed: Appointment scheduled with PCP office.   Functional Questionnaire:   Activities of Daily Living (ADLs):  No help needed at this time   Any transportation issues/concerns?: No, patient states she drives   Any patient concerns? Patient states she has no concerns during phone conversation   Confirmed importance and date/time of follow-up visits scheduled: Yes     Confirmed with patient if condition begins to worsen call PCP or go to the ER. Yes     Patient was given the Call-a-Nurse line [phone]: Yes, patient given phone number.

## 2015-12-03 NOTE — Telephone Encounter (Signed)
Patient returning your call best # [phone]

## 2015-12-03 NOTE — Telephone Encounter (Signed)
Called patient with Hospital follow up review. Patient states she did not get discharge instructions. Reviewed what was on her summary with patient and offered to mail copy to her but patient states she will pick up when she comes in on 12/05/14 for hospital follow up appointment with [facility].

## 2015-12-03 NOTE — Telephone Encounter (Signed)
I am confused this is regarding a hospitalization back in 2015? But she was admitted recently so just clarify when I need to see her

## 2015-12-04 DIAGNOSIS — Z96651 Presence of right artificial knee joint: Secondary | ICD-10-CM | POA: Diagnosis not present

## 2015-12-04 DIAGNOSIS — Z471 Aftercare following joint replacement surgery: Secondary | ICD-10-CM | POA: Diagnosis not present

## 2015-12-04 NOTE — Telephone Encounter (Signed)
The patient was Hospitalized on 11/29/15 and Discharged 12/01/15. Patient is scheduled for 12/05/15 for Hospital follow up with Mackie Pai.

## 2015-12-05 ENCOUNTER — Ambulatory Visit (INDEPENDENT_AMBULATORY_CARE_PROVIDER_SITE_OTHER): Payer: PPO | Admitting: Medical

## 2015-12-05 ENCOUNTER — Encounter: Payer: Self-pay | Admitting: Medical

## 2015-12-05 VITALS — BP 130/70 | HR 51 | Temp 97.8°F | Resp 16 | Ht 68.0 in | Wt 246.8 lb

## 2015-12-05 DIAGNOSIS — M1A9XX1 Chronic gout, unspecified, with tophus (tophi): Secondary | ICD-10-CM | POA: Diagnosis not present

## 2015-12-05 DIAGNOSIS — S39012A Strain of muscle, fascia and tendon of lower back, initial encounter: Secondary | ICD-10-CM | POA: Diagnosis not present

## 2015-12-05 NOTE — Patient Instructions (Addendum)
For gout I would continue colchicine. You resolved from gout like pain but can prevent gout as well.. I want to repeat uric acid level in one month. Evaluate uric acid  level at that time and then determine long term treatment.  For back strain from fall yesterday.  This pain is mild but if this  Pain lingers or changes local then may refer to PT or get xray.  Follow up date to be determined after uric acid check in one month or as needed  Also continue to follow up with coumadin clinic for your inr for mechanical valve.

## 2015-12-05 NOTE — Progress Notes (Signed)
Pre visit review using our clinic review tool, if applicable. No additional management support is needed unless otherwise documented below in the visit note. 

## 2015-12-05 NOTE — Progress Notes (Signed)
Subjective:    Patient ID: [name], female    DOB: Dec 14, 1950, 65 y.o.   MRN: [id number]  HPI   Pt in states feeling good.(Then states irritated about me being late to get in room). I apologized.   Has been some time since she was seen by pcp.  Pt was evaluated and admitted to hospital. Was in past  Thursday to Saturday per her report. Pt states her knee is no longer swollen and not painful.   Pt saw orthopedist. Yesterday. No changes to tx. Pt states ortho did not want to drain knee due to being on coumadin.  Pt was on prednisone in hospital. She is now on colchicine. Pt is on one a day.   Pt last inr was 2.5. She has mechanical heart valve.   Fall yesterday(on her left side) but no injury except for faint lt sided  para t-spine  region pain on occasion. But no neck pain, no head trauma. No shoulder, no elbow, no hip pain, no arm pain. (describes tripping on curb leaving ortho office)    Review of Systems  Constitutional: Negative for fever, chills and fatigue.  Respiratory: Negative for chest tightness, shortness of breath and wheezing.   Cardiovascular: Negative for chest pain and palpitations.  Musculoskeletal:       Rt knee pain much improved  Neurological: Negative for dizziness and headaches.  Hematological: Negative for adenopathy. Does not bruise/bleed easily.   Past Medical History  Diagnosis Date  . Anemia   . Anxiety   . Depression   . Diverticulosis   . Elevated cholesterol   . Obesity   . THYROMEGALY 07/22/2010  . Overweight(278.02) 03/11/2010  . Mixed hyperlipidemia 03/11/2010  . MEASLES, HX OF 03/11/2010  . KNEE PAIN, RIGHT 05/28/2010  . HYPERTRIGLYCERIDEMIA 03/11/2010  . HEART MURMUR, HX OF 03/11/2010  . FIBROIDS, UTERUS 03/11/2010  . Diarrhea 08/05/2010  . CONSTIPATION 03/11/2010  . CHICKENPOX, HX OF 03/11/2010  . BIPOLAR AFFECTIVE DISORDER 03/11/2010  . AORTIC VALVE REPLACEMENT, HX OF 03/11/2010  . ANEMIA 05/28/2010  . Acute bronchitis  07/22/2010  . Abdominal pain, generalized 03/19/2010  . Peripheral edema 11/18/2010  . Bacterial vaginitis 11/18/2010  . Hematuria 12/18/2010  . Diverticulitis 12/18/2010  . Hyperthyroidism 12/18/2010  . Fatigue 12/30/2010  . Renal insufficiency 12/30/2010  . Grave's disease 8-12  . Arm lesion 03/23/2011  . UTI (lower urinary tract infection) 08/14/2011  . Breast pain in female 11/17/2011  . Chest pain 11/17/2011  . Hematoma 12/18/2011  . Cervical cancer screening 03/22/2012  . Foot pain, left 03/22/2012  . Foot fracture, left 03/22/2012  . Tick bite of back 10/15/2012  . Preop examination 02/03/2013  . Dysrhythmia   . Hypertension   . Shortness of breath     with exertion   . Heart murmur   . Urinary tract bacterial infections   . Arthritis      Social History   Social History  . Marital Status: Married    Spouse Name: N/A  . Number of Children: N/A  . Years of Education: N/A   Occupational History  . Not on file.   Social History Main Topics  . Smoking status: Former Smoker    Quit date: 05/26/1990  . Smokeless tobacco: Never Used  . Alcohol Use: 0.0 oz/week    0 Standard drinks or equivalent per week     Comment: rare  . Drug Use: No  . Sexual Activity: No   Other Topics Concern  .  Not on file   Social History Narrative    Past Surgical History  Procedure Laterality Date  . Appendectomy      Required revision for EColi infection, required recurrent packing  . Bowel obstruction      Requiring adhesions to be removed  . Aortic valve replacement    . Open heart with a cardiac aneurysm repair    . Cholecystectomy    . Tubal ligation    . Varicose vein surgery b/l legs    . Childhood exploratory surgery of heart    . Hernia repair  08-16-10  . Abdominal hysterectomy  ?1998    sb/l spo, total for fibroids/heavy bleeding  . Total knee arthroplasty Right 02/24/2013    Procedure: RIGHT TOTAL KNEE ARTHROPLASTY;  Surgeon: Johnn Hai, MD;  Location: [facility];  Service:  Orthopedics;  Laterality: Right;  . Left heart catheterization with coronary angiogram N/A 12/05/2011    Procedure: LEFT HEART CATHETERIZATION WITH CORONARY ANGIOGRAM;  Surgeon: Sinclair Grooms, MD;  Location: [facility] CATH LAB;  Service: Cardiovascular;  Laterality: N/A;  . Cystocele repair N/A 10/16/2015    Procedure: ANTERIOR REPAIR (CYSTOCELE);  Surgeon: Donnamae Jude, MD;  Location: [facility];  Service: Gynecology;  Laterality: N/A;  . Colonoscopy with propofol N/A 11/01/2015    Procedure: COLONOSCOPY WITH PROPOFOL;  Surgeon: Milus Banister, MD;  Location: [facility];  Service: Endoscopy;  Laterality: N/A;    Family History  Problem Relation Age of Onset  . Scoliosis Mother   . Arthritis Mother     Rheumatoid  . Aneurysm Mother     heart  . Alcohol abuse Father   . Depression Sister   . Depression Brother   . Alcohol abuse Brother   . Cancer Maternal Grandmother     colon  . Diabetes Maternal Grandfather   . Heart disease Maternal Grandfather   . Alcohol abuse Maternal Grandfather   . Depression Paternal Grandmother   . Diabetes Paternal Grandmother   . Depression Paternal Grandfather   . Diabetes Paternal Grandfather   . Depression Sister   . Alcohol abuse Brother   . Depression Brother   . Heart disease Brother     Allergies  Allergen Reactions  . Sulfonamide Derivatives Other (See Comments)    headaches    Current Outpatient Prescriptions on File Prior to Visit  Medication Sig Dispense Refill  . acetaminophen (TYLENOL) 500 MG tablet Take 1,000-1,500 mg by mouth every 6 (six) hours as needed (For knee pain.).    Marland Kitchen aspirin EC 81 MG tablet Take 81 mg by mouth daily.    . clonazePAM (KLONOPIN) 0.5 MG tablet Take 0.5 mg by mouth 2 (two) times daily.     . colchicine 0.6 MG tablet Take 1 tablet (0.6 mg total) by mouth daily. 30 tablet 0  . cyclobenzaprine (FLEXERIL) 10 MG tablet Take 10 mg by mouth 3 (three) times daily as needed for muscle spasms.    . Estradiol 10 MCG TABS  vaginal tablet One tablet in vagina daily for 2 weeks then 3 times a week for total of 8 weeks (Patient taking differently: Place 10 mcg vaginally every Monday, Wednesday, and Friday. ) 30 tablet 2  . fenofibrate 160 MG tablet Take 160 mg by mouth daily.    . furosemide (LASIX) 40 MG tablet Take 1 tablet (40 mg total) by mouth daily. 30 tablet 11  . glucose blood test strip Use once daily to check blood sugar.  DX  E11.9 100 each 3  . lamoTRIgine (LAMICTAL) 200 MG tablet Take 200 mg by mouth at bedtime.    . metoprolol succinate (TOPROL-XL) 25 MG 24 hr tablet TAKE 1 TABLET (25 MG TOTAL) BY MOUTH EVERY MORNING. (Patient taking differently: Take 25 mg by mouth every morning. ) 30 tablet 4  . ONE TOUCH LANCETS MISC Test once daily to check blood sugar. DX E11.9 100 each 3  . oxyCODONE (OXY IR/ROXICODONE) 5 MG immediate release tablet Take 1 tablet (5 mg total) by mouth every 4 (four) hours as needed for moderate pain. 30 tablet 0  . pantoprazole (PROTONIX) 40 MG tablet TAKE 1 TABLET (40 MG TOTAL) BY MOUTH DAILY. (Patient taking differently: Take 40 mg by mouth daily. TAKE 1 TABLET (40 MG TOTAL) BY MOUTH DAILY.) 30 tablet 5  . Polyethyl Glycol-Propyl Glycol (SYSTANE OP) Place 1 drop into both eyes at bedtime as needed (For dry eyes.).     Marland Kitchen predniSONE (DELTASONE) 20 MG tablet Take 2 tablets (40 mg total) by mouth daily with breakfast. 3 tablet 0  . Probiotic Product (ALIGN) 4 MG CAPS Take 4 mg by mouth daily.    . rosuvastatin (CRESTOR) 40 MG tablet TAKE 1 TABLET (40 MG TOTAL) BY MOUTH DAILY. (Patient taking differently: Take 40 mg by mouth daily. ) 30 tablet 6  . SEROQUEL XR 400 MG 24 hr tablet Take 400 mg by mouth at bedtime.   1  . venlafaxine XR (EFFEXOR-XR) 150 MG 24 hr capsule Take 150 mg by mouth every morning.    . warfarin (COUMADIN) 5 MG tablet Take 2.5-5 mg by mouth at bedtime. Take half a tablet (2.5mg ) on Tuesday, Thursday, Saturday and one tablet (5mg ) the rest of the week.    .  [DISCONTINUED] methimazole (TAPAZOLE) 5 MG tablet Take 1 tablet (5 mg total) by mouth 3 (three) times daily. 30 tablet 2   No current facility-administered medications on file prior to visit.    BP 130/70 mmHg  Pulse 51  Temp(Src) 97.8 F (36.6 C) (Oral)  Resp 16  Ht 5\' 8"  (1.727 m)  Wt 246 lb 12.8 oz (111.948 kg)  BMI 37.53 kg/m2  SpO2 98%       Objective:   Physical Exam  General- No acute distress. Pleasant patient. Neck- Full range of motion, no jvd Lungs- Clear, even and unlabored. Heart- regular rate and rhythm. Neurologic- CNII- XII grossly intact. Rt knee- no swelling and non warmth. On flexion and extension no pain. Rt lower ext- no homan sign. Neck- no pain on palpatin. Lt shoudler, rt arm, rt elbow, rt forearm, rt wrist, rt hand, rt hip and rt ribs(none of these area tender)        Assessment & Plan:  For gout I would continue colchicine. You have resolved from gout like pain but can prevent gout as well. I want to repeat uric acid level in one month. Evaluate level at that time and then determine long term treatment.  For back strain from fall yesterday.  This pain is mild but if this  Pain lingers or changes local then may refer to PT or get xray.  Follow up date to be determined after uric acid check in one month or as needed  Also continue to follow up with coumadin clinic for your inr for mechanical valve.  Dalani Mette, Percell Miller, PA-C

## 2015-12-18 ENCOUNTER — Ambulatory Visit (INDEPENDENT_AMBULATORY_CARE_PROVIDER_SITE_OTHER): Payer: PPO | Admitting: *Deleted

## 2015-12-18 DIAGNOSIS — I4891 Unspecified atrial fibrillation: Secondary | ICD-10-CM

## 2015-12-18 DIAGNOSIS — Z954 Presence of other heart-valve replacement: Secondary | ICD-10-CM

## 2015-12-18 DIAGNOSIS — Z952 Presence of prosthetic heart valve: Secondary | ICD-10-CM

## 2015-12-18 DIAGNOSIS — Z5181 Encounter for therapeutic drug level monitoring: Secondary | ICD-10-CM | POA: Diagnosis not present

## 2015-12-18 LAB — POCT INR: INR: 5.8

## 2015-12-18 MED ORDER — WARFARIN SODIUM 5 MG PO TABS: ORAL_TABLET | ORAL | 1 refills | Status: DC

## 2015-12-20 ENCOUNTER — Ambulatory Visit (INDEPENDENT_AMBULATORY_CARE_PROVIDER_SITE_OTHER): Payer: PPO | Admitting: Family Medicine

## 2015-12-20 ENCOUNTER — Encounter: Payer: Self-pay | Admitting: Family Medicine

## 2015-12-20 VITALS — Wt 247.3 lb

## 2015-12-20 DIAGNOSIS — N939 Abnormal uterine and vaginal bleeding, unspecified: Secondary | ICD-10-CM | POA: Diagnosis not present

## 2015-12-20 NOTE — Patient Instructions (Signed)

## 2015-12-20 NOTE — Assessment & Plan Note (Signed)
Resolved as of now. Return with further bleeding.

## 2015-12-20 NOTE — Progress Notes (Signed)
   Subjective:    Patient ID: [name] is a 65 y.o. female presenting with Advice Only  on 12/20/2015  HPI: Here for f/u. No further bleeding since seeing Dr. Everitt Amber. She declined to operate on her. Her biopsy was again benign.  She has been hospitalized for knee swelling that might have been a bleed vs. Gout. INR yesterday was 5.1--to hold Coumadin x 3 days and eat several green, leafy salads.  Review of Systems  Constitutional: Negative for chills and fever.  Respiratory: Negative for shortness of breath.   Cardiovascular: Negative for chest pain.  Gastrointestinal: Negative for abdominal pain, nausea and vomiting.  Genitourinary: Negative for dysuria.  Skin: Negative for rash.      Objective:    Wt 247 lb 4.8 oz (112.2 kg)   BMI 37.60 kg/m  Physical Exam  Constitutional: She is oriented to person, place, and time. She appears well-developed and well-nourished. No distress.  HENT:  Head: Normocephalic and atraumatic.  Eyes: No scleral icterus.  Neck: Neck supple.  Cardiovascular: Normal rate.   Pulmonary/Chest: Effort normal.  Abdominal: Soft.  Neurological: She is alert and oriented to person, place, and time.  Skin: Skin is warm and dry.  Psychiatric: She has a normal mood and affect.        Assessment & Plan:   Problem List Items Addressed This Visit      Unprioritized   Vaginal bleeding - Primary    Resolved as of now. Return with further bleeding.       Other Visit Diagnoses   None.     Total face-to-face time with patient: 15 minutes. Over 50% of encounter was spent on counseling and coordination of care. Return in about 3 months (around 03/21/2016) for a follow-up, please call Dr. Randel Pigg for an appointment.  Zedrick Springsteen S 12/20/2015 11:39 AM

## 2015-12-25 ENCOUNTER — Ambulatory Visit: Payer: BLUE CROSS/BLUE SHIELD | Admitting: Gastroenterology

## 2015-12-26 ENCOUNTER — Telehealth: Payer: Self-pay | Admitting: Gastroenterology

## 2015-12-26 NOTE — Telephone Encounter (Signed)
Do not bill 

## 2016-01-01 ENCOUNTER — Ambulatory Visit (INDEPENDENT_AMBULATORY_CARE_PROVIDER_SITE_OTHER): Payer: PPO | Admitting: *Deleted

## 2016-01-01 DIAGNOSIS — I4891 Unspecified atrial fibrillation: Secondary | ICD-10-CM

## 2016-01-01 DIAGNOSIS — Z5181 Encounter for therapeutic drug level monitoring: Secondary | ICD-10-CM | POA: Diagnosis not present

## 2016-01-01 DIAGNOSIS — Z954 Presence of other heart-valve replacement: Secondary | ICD-10-CM

## 2016-01-01 DIAGNOSIS — Z952 Presence of prosthetic heart valve: Secondary | ICD-10-CM

## 2016-01-01 LAB — POCT INR: INR: 5.3

## 2016-01-08 ENCOUNTER — Ambulatory Visit (INDEPENDENT_AMBULATORY_CARE_PROVIDER_SITE_OTHER): Payer: PPO

## 2016-01-08 DIAGNOSIS — Z5181 Encounter for therapeutic drug level monitoring: Secondary | ICD-10-CM | POA: Diagnosis not present

## 2016-01-08 DIAGNOSIS — I4891 Unspecified atrial fibrillation: Secondary | ICD-10-CM

## 2016-01-08 DIAGNOSIS — Z954 Presence of other heart-valve replacement: Secondary | ICD-10-CM

## 2016-01-08 DIAGNOSIS — Z952 Presence of prosthetic heart valve: Secondary | ICD-10-CM

## 2016-01-08 LAB — POCT INR: INR: 3.3

## 2016-01-18 ENCOUNTER — Emergency Department (HOSPITAL_BASED_OUTPATIENT_CLINIC_OR_DEPARTMENT_OTHER)
Admission: EM | Admit: 2016-01-18 | Discharge: 2016-01-18 | Disposition: A | Discharge status: Home / Self Care | Payer: PPO | Attending: Emergency Medicine | Admitting: Emergency Medicine

## 2016-01-18 ENCOUNTER — Telehealth: Payer: Self-pay | Admitting: Internal Medicine

## 2016-01-18 ENCOUNTER — Encounter (HOSPITAL_BASED_OUTPATIENT_CLINIC_OR_DEPARTMENT_OTHER): Payer: Self-pay | Admitting: *Deleted

## 2016-01-18 DIAGNOSIS — L03116 Cellulitis of left lower limb: Secondary | ICD-10-CM | POA: Diagnosis not present

## 2016-01-18 DIAGNOSIS — Z87891 Personal history of nicotine dependence: Secondary | ICD-10-CM | POA: Insufficient documentation

## 2016-01-18 DIAGNOSIS — Z7901 Long term (current) use of anticoagulants: Secondary | ICD-10-CM | POA: Insufficient documentation

## 2016-01-18 DIAGNOSIS — I10 Essential (primary) hypertension: Secondary | ICD-10-CM | POA: Insufficient documentation

## 2016-01-18 DIAGNOSIS — Z7982 Long term (current) use of aspirin: Secondary | ICD-10-CM | POA: Diagnosis not present

## 2016-01-18 DIAGNOSIS — E039 Hypothyroidism, unspecified: Secondary | ICD-10-CM | POA: Diagnosis not present

## 2016-01-18 DIAGNOSIS — R21 Rash and other nonspecific skin eruption: Secondary | ICD-10-CM | POA: Diagnosis not present

## 2016-01-18 MED ORDER — CEPHALEXIN 500 MG PO CAPS: 500.0000 mg | ORAL_CAPSULE | Freq: Four times a day (QID) | ORAL | 0 refills | Status: DC

## 2016-01-18 NOTE — Telephone Encounter (Signed)
[facility] - Client  [place]    --------------------------------------------------------------------------------   Patient Name: [name]  Gender: Female  DOB: 07-Aug-1950   Age: 65 Y 46 M 19 D  Return Phone Number: [phone] (Primary)  Address:     City/State/Zip:  [place]     [facility] - Client  Client Site [facility]  Contact Type Call  Who Is Calling Patient / Member / Family / Caregiver  Call Type Triage / Clinical  Relationship To Patient Self  Return Phone Number [phone] (Primary)  Chief Complaint Leg Swelling And Edema  Reason for Call Symptomatic / Request for [place] states she has a sore on her leg and it is red and swollen.   PreDisposition Go to Urgent Care/Walk-In Clinic  Translation No       Nurse Assessment  Nurse: Venetia Maxon, RN, Manuela Schwartz Date/Time (Eastern Time): 01/18/2016 4:34:25 PM  Confirm and document reason for call. If symptomatic, describe symptoms. You must click the next button to save text entered. ---Caller states she has a sore on her leg and it is red and swollen. Left ankle area . It is the size of a quarter. no bite mark in the center. no fever    Has the patient traveled out of the country within the last 30 days? ---No    Does the patient have any new or worsening symptoms? ---Yes    Will a triage be completed? ---Yes    Related visit to physician within the last 2 weeks? ---No    Does the PT have any chronic conditions? (i.e. diabetes, asthma, etc.) ---Yes    List chronic conditions. ---on blood thinner : on coumadin Afib HTN, cholesterol    Is this a behavioral health or substance abuse call? ---No           Guidelines          Guideline Title Affirmed Question Affirmed Notes Nurse Date/Time (Eastern Time)  Sores [1] Looks infected (spreading redness, pus) AND  [2] large red area (> 2 in. or 5 cm)    Venetia Maxon, RN, Manuela Schwartz 01/18/2016 4:37:16 PM    Disp. Time Eilene Ghazi Time) Disposition Final User    01/18/2016 4:18:11 PM Send To Clinical Follow Up Yehuda Budd, RNVaughan Basta    01/18/2016 4:19:55 PM Attempt made - message left   Venetia Maxon, RN, Manuela Schwartz      01/18/2016 4:38:14 PM See Physician within 4 Hours (or PCP triage) Yes Venetia Maxon, RN, Edwena Bunde Understands: Yes  Disagree/Comply: Comply       Care Advice Given Per Guideline        SEE PHYSICIAN WITHIN 4 HOURS (or PCP triage): CALL BACK IF: * You become worse. CARE ADVICE given per [facility] (Adult) guideline.        --------------------------------------------------------------------------------            Referrals  [facility] [place]

## 2016-01-18 NOTE — ED Provider Notes (Signed)
[facility] Provider Note   CSN: [id number] Arrival date & time: 01/18/16  1708  By signing my name below, I, Emmanuella Mensah, attest that this documentation has been prepared under the direction and in the presence of [facility], PA-C. Electronically Signed: Judithann Sauger, ED Scribe. 01/18/16. 6:45 PM.    History   Chief Complaint Chief Complaint  Patient presents with  . Rash    HPI Comments: [name] is a 65 y.o. female with a hx of anemia, mechanical aortic valve replacement, A-fib, and renal insufficiency who presents to the Emergency Department complaining of gradually worsening small skin abrasion to her left lower leg that she noticed yesterday. She notes that the area is spreading with redness and swelling. She states that she is unsure of any bug bites, known injury, or trauma to the area. No alleviating factors. Pt has not tried any medications PTA. She reports an allergy to Sulfa-antibiotics. She denies any calf pain, fever, chills, n/v, or generalized rash.    The history is provided by the patient. No language interpreter was used.    Past Medical History:  Diagnosis Date  . Abdominal pain, generalized 03/19/2010  . Acute bronchitis 07/22/2010  . Anemia   . ANEMIA 05/28/2010  . Anxiety   . AORTIC VALVE REPLACEMENT, HX OF 03/11/2010  . Arm lesion 03/23/2011  . Arthritis   . Bacterial vaginitis 11/18/2010  . BIPOLAR AFFECTIVE DISORDER 03/11/2010  . Breast pain in female 11/17/2011  . Cervical cancer screening 03/22/2012  . Chest pain 11/17/2011  . CHICKENPOX, HX OF 03/11/2010  . CONSTIPATION 03/11/2010  . Depression   . Diarrhea 08/05/2010  . Diverticulitis 12/18/2010  . Diverticulosis   . Dysrhythmia   . Elevated cholesterol   . Fatigue 12/30/2010  . FIBROIDS, UTERUS 03/11/2010  . Foot fracture, left 03/22/2012  . Foot pain, left 03/22/2012  . Grave's disease 8-12  . Heart murmur   . HEART MURMUR, HX OF 03/11/2010  . Hematoma 12/18/2011    . Hematuria 12/18/2010  . Hypertension   . Hyperthyroidism 12/18/2010  . HYPERTRIGLYCERIDEMIA 03/11/2010  . KNEE PAIN, RIGHT 05/28/2010  . MEASLES, HX OF 03/11/2010  . Mixed hyperlipidemia 03/11/2010  . Obesity   . Overweight(278.02) 03/11/2010  . Peripheral edema 11/18/2010  . Preop examination 02/03/2013  . Renal insufficiency 12/30/2010  . Shortness of breath    with exertion   . THYROMEGALY 07/22/2010  . Tick bite of back 10/15/2012  . Urinary tract bacterial infections   . UTI (lower urinary tract infection) 08/14/2011    Patient Active Problem List   Diagnosis Date Noted  . Effusion of right knee 11/29/2015  . Knee effusion 11/29/2015  . Vaginal bleeding 10/25/2015  . UTI symptoms 07/18/2015  . Hip pain, acute 04/15/2015  . Encounter for therapeutic drug monitoring 09/13/2013  . Essential hypertension, benign 04/05/2013  . Atrial fibrillation ([place]) 03/18/2013  . Long term current use of anticoagulant therapy 03/11/2013  . Anxiety 03/01/2013  . Right knee DJD 02/25/2013  . Cervical cancer screening 03/22/2012  . Recurrent UTI 08/14/2011  . Arm lesion 03/23/2011  . Pernicious anemia 02/21/2011  . Fatigue 12/30/2010  . Renal insufficiency 12/30/2010  . Hyperthyroidism 12/18/2010  . THYROMEGALY 07/22/2010  . ANEMIA 05/28/2010  . ABDOMINAL PAIN, GENERALIZED 03/19/2010  . MIXED HYPERLIPIDEMIA 03/11/2010  . Overweight 03/11/2010  . BIPOLAR AFFECTIVE DISORDER 03/11/2010  . DIVERTICULOSIS OF COLON 03/11/2010  . Constipation 03/11/2010  . MEASLES, HX OF 03/11/2010  . CHICKENPOX, HX  OF 03/11/2010  . Hx of mechanical aortic valve replacement 03/11/2010    Past Surgical History:  Procedure Laterality Date  . ABDOMINAL HYSTERECTOMY  ?1998   sb/l spo, total for fibroids/heavy bleeding  . AORTIC VALVE REPLACEMENT    . APPENDECTOMY     Required revision for EColi infection, required recurrent packing  . bowel obstruction     Requiring adhesions to be removed  . childhood  exploratory surgery of heart    . CHOLECYSTECTOMY    . COLONOSCOPY WITH PROPOFOL N/A 11/01/2015   Procedure: COLONOSCOPY WITH PROPOFOL;  Surgeon: Milus Banister, MD;  Location: [facility];  Service: Endoscopy;  Laterality: N/A;  . CYSTOCELE REPAIR N/A 10/16/2015   Procedure: ANTERIOR REPAIR (CYSTOCELE);  Surgeon: Donnamae Jude, MD;  Location: [facility];  Service: Gynecology;  Laterality: N/A;  . HERNIA REPAIR  08-16-10  . LEFT HEART CATHETERIZATION WITH CORONARY ANGIOGRAM N/A 12/05/2011   Procedure: LEFT HEART CATHETERIZATION WITH CORONARY ANGIOGRAM;  Surgeon: Sinclair Grooms, MD;  Location: [facility] CATH LAB;  Service: Cardiovascular;  Laterality: N/A;  . open heart with a cardiac aneurysm repair    . TOTAL KNEE ARTHROPLASTY Right 02/24/2013   Procedure: RIGHT TOTAL KNEE ARTHROPLASTY;  Surgeon: Johnn Hai, MD;  Location: [facility];  Service: Orthopedics;  Laterality: Right;  . TUBAL LIGATION    . varicose vein surgery b/l legs      OB History    Gravida Para Term Preterm AB Living   2 2 2     2    SAB TAB Ectopic Multiple Live Births                   Home Medications    Prior to Admission medications   Medication Sig Start Date End Date Taking? Authorizing Provider  acetaminophen (TYLENOL) 500 MG tablet Take 1,000-1,500 mg by mouth every 6 (six) hours as needed (For knee pain.).    Historical Provider, MD  aspirin EC 81 MG tablet Take 81 mg by mouth daily.    Historical Provider, MD  clonazePAM (KLONOPIN) 0.5 MG tablet Take 0.5 mg by mouth 2 (two) times daily.     Historical Provider, MD  colchicine 0.6 MG tablet Take 1 tablet (0.6 mg total) by mouth daily. 12/01/15   Velvet Bathe, MD  cyclobenzaprine (FLEXERIL) 10 MG tablet Take 10 mg by mouth 3 (three) times daily as needed for muscle spasms.    Historical Provider, MD  Estradiol 10 MCG TABS vaginal tablet One tablet in vagina daily for 2 weeks then 3 times a week for total of 8 weeks Patient taking differently: Place 10 mcg vaginally  every Monday, Wednesday, and Friday.  10/12/15   Woodroe Mode, MD  fenofibrate 160 MG tablet Take 160 mg by mouth daily.    Historical Provider, MD  furosemide (LASIX) 40 MG tablet Take 1 tablet (40 mg total) by mouth daily. 06/06/15   Belva Crome, MD  glucose blood test strip Use once daily to check blood sugar.  DX E11.9 04/26/15   Mosie Lukes, MD  lamoTRIgine (LAMICTAL) 200 MG tablet Take 200 mg by mouth at bedtime.    Historical Provider, MD  metoprolol succinate (TOPROL-XL) 25 MG 24 hr tablet TAKE 1 TABLET (25 MG TOTAL) BY MOUTH EVERY MORNING. Patient taking differently: Take 25 mg by mouth every morning.  10/12/15   Mosie Lukes, MD  ONE TOUCH LANCETS MISC Test once daily to check blood sugar. DX E11.9  04/26/15   Mosie Lukes, MD  oxyCODONE (OXY IR/ROXICODONE) 5 MG immediate release tablet Take 1 tablet (5 mg total) by mouth every 4 (four) hours as needed for moderate pain. 12/01/15   Velvet Bathe, MD  pantoprazole (PROTONIX) 40 MG tablet TAKE 1 TABLET (40 MG TOTAL) BY MOUTH DAILY. Patient taking differently: Take 40 mg by mouth daily. TAKE 1 TABLET (40 MG TOTAL) BY MOUTH DAILY. 11/23/15   Mosie Lukes, MD  Polyethyl Glycol-Propyl Glycol (SYSTANE OP) Place 1 drop into both eyes at bedtime as needed (For dry eyes.).     Historical Provider, MD  Probiotic Product (ALIGN) 4 MG CAPS Take 4 mg by mouth daily.    Historical Provider, MD  rosuvastatin (CRESTOR) 40 MG tablet TAKE 1 TABLET (40 MG TOTAL) BY MOUTH DAILY. Patient taking differently: Take 40 mg by mouth daily.  09/18/15   Mosie Lukes, MD  SEROQUEL XR 400 MG 24 hr tablet Take 400 mg by mouth at bedtime.  05/30/15   Historical Provider, MD  venlafaxine XR (EFFEXOR-XR) 150 MG 24 hr capsule Take 150 mg by mouth every morning.    Historical Provider, MD  warfarin (COUMADIN) 5 MG tablet Take as directed by coumadin clinic. 12/18/15   Belva Crome, MD    Family History Family History  Problem Relation Age of Onset  . Scoliosis Mother     . Arthritis Mother     Rheumatoid  . Aneurysm Mother     heart  . Alcohol abuse Father   . Depression Sister   . Depression Brother   . Alcohol abuse Brother   . Cancer Maternal Grandmother     colon  . Diabetes Maternal Grandfather   . Heart disease Maternal Grandfather   . Alcohol abuse Maternal Grandfather   . Depression Paternal Grandmother   . Diabetes Paternal Grandmother   . Depression Paternal Grandfather   . Diabetes Paternal Grandfather   . Depression Sister   . Alcohol abuse Brother   . Depression Brother   . Heart disease Brother     Social History Social History  Substance Use Topics  . Smoking status: Former Smoker    Quit date: 05/26/1990  . Smokeless tobacco: Never Used  . Alcohol use 0.0 oz/week     Comment: rare     Allergies   Sulfonamide derivatives   Review of Systems Review of Systems  Constitutional: Negative for chills and fever.  Gastrointestinal: Negative for nausea and vomiting.  Musculoskeletal: Negative for myalgias.  Skin: Positive for wound. Negative for rash.     Physical Exam Updated Vital Signs BP 146/67   Pulse (!) 57   Temp 98.2 F (36.8 C) (Oral)   Resp 20   Ht 5\' 8"  (1.727 m)   Wt 237 lb (107.5 kg)   SpO2 98%   BMI 36.04 kg/m   Physical Exam  Constitutional: She is oriented to person, place, and time. She appears well-developed and well-nourished. No distress.  HENT:  Head: Normocephalic and atraumatic.  Cardiovascular: Normal rate and regular rhythm.   2+ DP LLE  Pulmonary/Chest: Effort normal and breath sounds normal. No respiratory distress. She has no wheezes.  Musculoskeletal: She exhibits no edema.  Neurological: She is alert and oriented to person, place, and time.  Sensation intact LLE  Skin: Skin is warm and dry. There is erythema.  Left mid shin with 1 cm skin abrasion, surrounded by 1.5 cm eythema which is TTP. Mild swelling appreciated.  Nursing note and vitals reviewed.    ED Treatments /  Results  DIAGNOSTIC STUDIES: Oxygen Saturation is 98% on RA, normal by my interpretation.    COORDINATION OF CARE: 6:44 PM- Pt advised of plan for treatment and pt agrees. Advised to keep area clean with soap and water. Pt will receive doxycycline.    Procedures Procedures (including critical care time)  Medications Ordered in ED Medications - No data to display   Initial Impression / Assessment and Plan / ED Course  Pearlie Oyster, PA-C hasreviewed the triage vital signs and the nursing notes.  Pertinent labs & imaging results that were available during my care of the patient were reviewed by me and considered in my medical decision making (see chart for details).  Clinical Course   [name] is a 65 y.o. female who presents to ED for abrasion of LLE that has now developed redness and swelling. No warmth to the touch. Does appear infected. Patient is afebrile, hemodynamically stable, and well-appearing. Will treat with Keflex. Home wound care instructions discussed. Reasons to return to the ED discussed. All questions answered.  Patient discussed with Dr. Johnney Killian who agrees with treatment plan.   Final Clinical Impressions(s) / ED Diagnoses   Final diagnoses:  Cellulitis of left lower extremity    New Prescriptions New Prescriptions   No medications on file   I personally performed the services described in this documentation, which was scribed in my presence. The recorded information has been reviewed and is accurate.    [facility] Lucero Auzenne, PA-C 01/18/16 1859    Charlesetta Shanks, MD 01/19/16 [phone]

## 2016-01-18 NOTE — ED Triage Notes (Signed)
Small skin lesion to her left lower leg since yesterday.

## 2016-01-18 NOTE — Discharge Instructions (Signed)
Please take all of your antibiotics until finished!  Keep area clean and dry. Apply topical antibiotic ointment and keep covered when going outside.  Return to ER or follow up with primary care provider if redness worsens, pus drainage occurs, fevers, new or worsening symptoms, any additional concerns.

## 2016-01-18 NOTE — ED Notes (Signed)
Patient was seen and assessed  - patient is in no distress - at bedside d/c'ing the patient

## 2016-01-21 ENCOUNTER — Ambulatory Visit (INDEPENDENT_AMBULATORY_CARE_PROVIDER_SITE_OTHER): Payer: PPO | Admitting: Medical

## 2016-01-21 ENCOUNTER — Encounter: Payer: Self-pay | Admitting: Medical

## 2016-01-21 VITALS — BP 140/80 | HR 68 | Temp 97.7°F | Ht 68.0 in | Wt 234.4 lb

## 2016-01-21 DIAGNOSIS — L089 Local infection of the skin and subcutaneous tissue, unspecified: Secondary | ICD-10-CM

## 2016-01-21 MED ORDER — DOXYCYCLINE HYCLATE 100 MG PO TABS: 100.0000 mg | ORAL_TABLET | Freq: Two times a day (BID) | ORAL | 0 refills | Status: DC

## 2016-01-21 MED FILL — DOXYCYCLINE 100 MG TABLET: 100 | 7 days supply | Qty: 14 | Fill #0

## 2016-01-21 NOTE — Progress Notes (Addendum)
Subjective:    Patient ID: [name], female    DOB: 10-23-1950, 65 y.o.   MRN: [id number]  HPI   Pt in for area on her left lower extremities  Pt states area on scab present today was red. Pt states area came up quickly and then went to ED. Pt took one cephalexin. She got dizzy and head pressure. This is same reaction she had to sulfa.   Pt has been on cephalosporins in the past augmentin.(with no reaction in past). Pt after discussion is willing to take doxycycline.   Past Medical History:  Diagnosis Date  . Abdominal pain, generalized 03/19/2010  . Acute bronchitis 07/22/2010  . Anemia   . ANEMIA 05/28/2010  . Anxiety   . AORTIC VALVE REPLACEMENT, HX OF 03/11/2010  . Arm lesion 03/23/2011  . Arthritis   . Bacterial vaginitis 11/18/2010  . BIPOLAR AFFECTIVE DISORDER 03/11/2010  . Breast pain in female 11/17/2011  . Cervical cancer screening 03/22/2012  . Chest pain 11/17/2011  . CHICKENPOX, HX OF 03/11/2010  . CONSTIPATION 03/11/2010  . Depression   . Diarrhea 08/05/2010  . Diverticulitis 12/18/2010  . Diverticulosis   . Dysrhythmia   . Elevated cholesterol   . Fatigue 12/30/2010  . FIBROIDS, UTERUS 03/11/2010  . Foot fracture, left 03/22/2012  . Foot pain, left 03/22/2012  . Grave's disease 8-12  . Heart murmur   . HEART MURMUR, HX OF 03/11/2010  . Hematoma 12/18/2011  . Hematuria 12/18/2010  . Hypertension   . Hyperthyroidism 12/18/2010  . HYPERTRIGLYCERIDEMIA 03/11/2010  . KNEE PAIN, RIGHT 05/28/2010  . MEASLES, HX OF 03/11/2010  . Mixed hyperlipidemia 03/11/2010  . Obesity   . Overweight(278.02) 03/11/2010  . Peripheral edema 11/18/2010  . Preop examination 02/03/2013  . Renal insufficiency 12/30/2010  . Shortness of breath    with exertion   . THYROMEGALY 07/22/2010  . Tick bite of back 10/15/2012  . Urinary tract bacterial infections   . UTI (lower urinary tract infection) 08/14/2011     Social History   Social History  . Marital status: Married    Spouse  name: N/A  . Number of children: N/A  . Years of education: N/A   Occupational History  . Not on file.   Social History Main Topics  . Smoking status: Former Smoker    Quit date: 05/26/1990  . Smokeless tobacco: Never Used  . Alcohol use 0.0 oz/week     Comment: rare  . Drug use: No  . Sexual activity: No   Other Topics Concern  . Not on file   Social History Narrative  . No narrative on file    Past Surgical History:  Procedure Laterality Date  . ABDOMINAL HYSTERECTOMY  ?1998   sb/l spo, total for fibroids/heavy bleeding  . AORTIC VALVE REPLACEMENT    . APPENDECTOMY     Required revision for EColi infection, required recurrent packing  . bowel obstruction     Requiring adhesions to be removed  . childhood exploratory surgery of heart    . CHOLECYSTECTOMY    . COLONOSCOPY WITH PROPOFOL N/A 11/01/2015   Procedure: COLONOSCOPY WITH PROPOFOL;  Surgeon: Milus Banister, MD;  Location: [facility];  Service: Endoscopy;  Laterality: N/A;  . CYSTOCELE REPAIR N/A 10/16/2015   Procedure: ANTERIOR REPAIR (CYSTOCELE);  Surgeon: Donnamae Jude, MD;  Location: [facility];  Service: Gynecology;  Laterality: N/A;  . HERNIA REPAIR  08-16-10  . LEFT HEART CATHETERIZATION WITH CORONARY ANGIOGRAM N/A 12/05/2011  Procedure: LEFT HEART CATHETERIZATION WITH CORONARY ANGIOGRAM;  Surgeon: Sinclair Grooms, MD;  Location: [facility] CATH LAB;  Service: Cardiovascular;  Laterality: N/A;  . open heart with a cardiac aneurysm repair    . TOTAL KNEE ARTHROPLASTY Right 02/24/2013   Procedure: RIGHT TOTAL KNEE ARTHROPLASTY;  Surgeon: Johnn Hai, MD;  Location: [facility];  Service: Orthopedics;  Laterality: Right;  . TUBAL LIGATION    . varicose vein surgery b/l legs      Family History  Problem Relation Age of Onset  . Scoliosis Mother   . Arthritis Mother     Rheumatoid  . Aneurysm Mother     heart  . Alcohol abuse Father   . Depression Sister   . Depression Brother   . Alcohol abuse Brother   . Cancer  Maternal Grandmother     colon  . Diabetes Maternal Grandfather   . Heart disease Maternal Grandfather   . Alcohol abuse Maternal Grandfather   . Depression Paternal Grandmother   . Diabetes Paternal Grandmother   . Depression Paternal Grandfather   . Diabetes Paternal Grandfather   . Depression Sister   . Alcohol abuse Brother   . Depression Brother   . Heart disease Brother     Allergies  Allergen Reactions  . Keflex [Cephalexin] Other (See Comments)    Headaches,Dizziness  . Sulfonamide Derivatives Other (See Comments)    headaches    Current Outpatient Prescriptions on File Prior to Visit  Medication Sig Dispense Refill  . acetaminophen (TYLENOL) 500 MG tablet Take 1,000-1,500 mg by mouth every 6 (six) hours as needed (For knee pain.).    Marland Kitchen aspirin EC 81 MG tablet Take 81 mg by mouth daily.    . clonazePAM (KLONOPIN) 0.5 MG tablet Take 0.5 mg by mouth 2 (two) times daily.     . fenofibrate 160 MG tablet Take 160 mg by mouth daily.    . furosemide (LASIX) 40 MG tablet Take 1 tablet (40 mg total) by mouth daily. 30 tablet 11  . glucose blood test strip Use once daily to check blood sugar.  DX E11.9 100 each 3  . lamoTRIgine (LAMICTAL) 200 MG tablet Take 200 mg by mouth at bedtime.    . metoprolol succinate (TOPROL-XL) 25 MG 24 hr tablet TAKE 1 TABLET (25 MG TOTAL) BY MOUTH EVERY MORNING. (Patient taking differently: Take 25 mg by mouth every morning. ) 30 tablet 4  . ONE TOUCH LANCETS MISC Test once daily to check blood sugar. DX E11.9 100 each 3  . oxyCODONE (OXY IR/ROXICODONE) 5 MG immediate release tablet Take 1 tablet (5 mg total) by mouth every 4 (four) hours as needed for moderate pain. 30 tablet 0  . pantoprazole (PROTONIX) 40 MG tablet TAKE 1 TABLET (40 MG TOTAL) BY MOUTH DAILY. (Patient taking differently: Take 40 mg by mouth daily. TAKE 1 TABLET (40 MG TOTAL) BY MOUTH DAILY.) 30 tablet 5  . Polyethyl Glycol-Propyl Glycol (SYSTANE OP) Place 1 drop into both eyes at  bedtime as needed (For dry eyes.).     Marland Kitchen Probiotic Product (ALIGN) 4 MG CAPS Take 4 mg by mouth daily.    . rosuvastatin (CRESTOR) 40 MG tablet TAKE 1 TABLET (40 MG TOTAL) BY MOUTH DAILY. (Patient taking differently: Take 40 mg by mouth daily. ) 30 tablet 6  . SEROQUEL XR 400 MG 24 hr tablet Take 400 mg by mouth at bedtime.   1  . venlafaxine XR (EFFEXOR-XR) 150 MG 24 hr  capsule Take 150 mg by mouth every morning.    . warfarin (COUMADIN) 5 MG tablet Take as directed by coumadin clinic. 30 tablet 1  . colchicine 0.6 MG tablet Take 1 tablet (0.6 mg total) by mouth daily. (Patient not taking: Reported on 01/21/2016) 30 tablet 0  . cyclobenzaprine (FLEXERIL) 10 MG tablet Take 10 mg by mouth 3 (three) times daily as needed for muscle spasms.    . Estradiol 10 MCG TABS vaginal tablet One tablet in vagina daily for 2 weeks then 3 times a week for total of 8 weeks (Patient not taking: Reported on 01/21/2016) 30 tablet 2  . [DISCONTINUED] methimazole (TAPAZOLE) 5 MG tablet Take 1 tablet (5 mg total) by mouth 3 (three) times daily. 30 tablet 2   No current facility-administered medications on file prior to visit.     BP 140/80   Pulse 68   Temp 97.7 F (36.5 C) (Oral)   Ht 5\' 8"  (1.727 m)   Wt 234 lb 6.4 oz (106.3 kg)   SpO2 98%   BMI 35.64 kg/m        Review of Systems  Constitutional: Negative for chills, fatigue and fever.  Respiratory: Negative for cough, chest tightness, shortness of breath and wheezing.   Cardiovascular: Negative for chest pain and palpitations.  Gastrointestinal: Negative for abdominal pain.  Skin:       See hpi.  Neurological: Negative for dizziness and headaches.  Hematological: Negative for adenopathy. Does not bruise/bleed easily.       Objective:   Physical Exam   General- No acute distress. Pleasant patient. Neck- Full range of motion, no jvd Lungs- Clear, even and unlabored. Heart- regular rate and rhythm.(mechanical valve heard) Neurologic- CNII-  XII grossly intact.  Lt lower ext- small scab distal tibia. Faint redness around edge. No dc. No lymphadenopathy. No tracking up her leg. Some indurated feel to area just around scab. No fluctucance. No discharge.      Assessment & Plan:  Your leg by your recent description and by exam today appears infected. Will rx doxycycline antibiotic. Rx advisement.   Please call the coumadin clinic and notify them that you or on doxycycline. So they can advise when to come in for recheck.  Follow up in 5-7 days or as needed  Abdulhamid Olgin, Percell Miller, [employer]

## 2016-01-21 NOTE — Progress Notes (Signed)
Pre visit review using our clinic tool,if applicable. No additional management support is needed unless otherwise documented below in the visit note.  

## 2016-01-21 NOTE — Telephone Encounter (Signed)
Pt seen 01/18/16 at [facility] ED.

## 2016-01-21 NOTE — Patient Instructions (Addendum)
Your leg by your recent description and by exam today appears infected. Will rx doxycycline antibiotic. Rx advisement.   Please call the coumadin clinic and notify them that you or on doxycycline. So they can advise when to come in for recheck.  Follow up in 5-7 days or as needed

## 2016-01-22 ENCOUNTER — Ambulatory Visit (INDEPENDENT_AMBULATORY_CARE_PROVIDER_SITE_OTHER): Payer: PPO | Admitting: *Deleted

## 2016-01-22 DIAGNOSIS — Z5181 Encounter for therapeutic drug level monitoring: Secondary | ICD-10-CM | POA: Diagnosis not present

## 2016-01-22 DIAGNOSIS — Z954 Presence of other heart-valve replacement: Secondary | ICD-10-CM | POA: Diagnosis not present

## 2016-01-22 DIAGNOSIS — Z952 Presence of prosthetic heart valve: Secondary | ICD-10-CM

## 2016-01-22 DIAGNOSIS — I4891 Unspecified atrial fibrillation: Secondary | ICD-10-CM | POA: Diagnosis not present

## 2016-01-22 LAB — POCT INR: INR: 2.4

## 2016-01-25 ENCOUNTER — Ambulatory Visit (INDEPENDENT_AMBULATORY_CARE_PROVIDER_SITE_OTHER): Payer: PPO | Admitting: *Deleted

## 2016-01-25 DIAGNOSIS — I4891 Unspecified atrial fibrillation: Secondary | ICD-10-CM

## 2016-01-25 DIAGNOSIS — Z5181 Encounter for therapeutic drug level monitoring: Secondary | ICD-10-CM

## 2016-01-25 DIAGNOSIS — Z954 Presence of other heart-valve replacement: Secondary | ICD-10-CM

## 2016-01-25 DIAGNOSIS — Z952 Presence of prosthetic heart valve: Secondary | ICD-10-CM

## 2016-01-25 LAB — POCT INR: INR: 2

## 2016-02-01 ENCOUNTER — Ambulatory Visit (INDEPENDENT_AMBULATORY_CARE_PROVIDER_SITE_OTHER): Payer: PPO | Admitting: *Deleted

## 2016-02-01 DIAGNOSIS — Z5181 Encounter for therapeutic drug level monitoring: Secondary | ICD-10-CM

## 2016-02-01 DIAGNOSIS — I4891 Unspecified atrial fibrillation: Secondary | ICD-10-CM | POA: Diagnosis not present

## 2016-02-01 DIAGNOSIS — Z952 Presence of prosthetic heart valve: Secondary | ICD-10-CM

## 2016-02-01 DIAGNOSIS — Z954 Presence of other heart-valve replacement: Secondary | ICD-10-CM

## 2016-02-01 LAB — POCT INR: INR: 3.2

## 2016-02-18 ENCOUNTER — Ambulatory Visit (INDEPENDENT_AMBULATORY_CARE_PROVIDER_SITE_OTHER): Payer: PPO | Admitting: *Deleted

## 2016-02-18 ENCOUNTER — Telehealth: Payer: Self-pay | Admitting: *Deleted

## 2016-02-18 ENCOUNTER — Other Ambulatory Visit: Payer: Self-pay | Admitting: Interventional Cardiology

## 2016-02-18 DIAGNOSIS — Z954 Presence of other heart-valve replacement: Secondary | ICD-10-CM

## 2016-02-18 DIAGNOSIS — I4891 Unspecified atrial fibrillation: Secondary | ICD-10-CM | POA: Diagnosis not present

## 2016-02-18 DIAGNOSIS — Z952 Presence of prosthetic heart valve: Secondary | ICD-10-CM

## 2016-02-18 DIAGNOSIS — Z5181 Encounter for therapeutic drug level monitoring: Secondary | ICD-10-CM

## 2016-02-18 LAB — POCT INR: INR: 2.7

## 2016-02-18 NOTE — Telephone Encounter (Signed)
Refill done on coumadin and spoke with pt and she no longer is on Doxycycline and Methimazole

## 2016-03-12 DIAGNOSIS — Z954 Presence of other heart-valve replacement: Secondary | ICD-10-CM | POA: Diagnosis not present

## 2016-03-12 DIAGNOSIS — Z5181 Encounter for therapeutic drug level monitoring: Secondary | ICD-10-CM | POA: Diagnosis not present

## 2016-03-16 ENCOUNTER — Other Ambulatory Visit: Payer: Self-pay | Admitting: Family Medicine

## 2016-03-18 ENCOUNTER — Ambulatory Visit: Payer: PPO | Admitting: Gastroenterology

## 2016-03-25 ENCOUNTER — Telehealth: Payer: Self-pay | Admitting: Pharmacist

## 2016-03-25 LAB — PROTIME-INR: INR: 4.1 — AB (ref 0.9–1.1)

## 2016-03-25 NOTE — Telephone Encounter (Signed)
Received call from [facility] that they need a standing order for PT/INR faxed to [phone]. Standing order was sent for pt to have INR drawn at lab while she is out of town and results will be faxed to [place].

## 2016-03-26 ENCOUNTER — Ambulatory Visit (INDEPENDENT_AMBULATORY_CARE_PROVIDER_SITE_OTHER): Payer: PPO | Admitting: Cardiology

## 2016-03-26 DIAGNOSIS — Z952 Presence of prosthetic heart valve: Secondary | ICD-10-CM

## 2016-03-26 DIAGNOSIS — I4891 Unspecified atrial fibrillation: Secondary | ICD-10-CM

## 2016-03-26 DIAGNOSIS — Z5181 Encounter for therapeutic drug level monitoring: Secondary | ICD-10-CM

## 2016-04-08 DIAGNOSIS — Z5181 Encounter for therapeutic drug level monitoring: Secondary | ICD-10-CM | POA: Diagnosis not present

## 2016-04-08 DIAGNOSIS — I4891 Unspecified atrial fibrillation: Secondary | ICD-10-CM | POA: Diagnosis not present

## 2016-04-08 DIAGNOSIS — Z954 Presence of other heart-valve replacement: Secondary | ICD-10-CM | POA: Diagnosis not present

## 2016-04-08 DIAGNOSIS — I359 Nonrheumatic aortic valve disorder, unspecified: Secondary | ICD-10-CM | POA: Diagnosis not present

## 2016-04-08 LAB — PROTIME-INR: INR: 3.9 — AB (ref <=1.1)

## 2016-04-10 ENCOUNTER — Other Ambulatory Visit: Payer: Self-pay | Admitting: Family Medicine

## 2016-04-11 ENCOUNTER — Other Ambulatory Visit: Payer: Self-pay | Admitting: Interventional Cardiology

## 2016-04-14 ENCOUNTER — Ambulatory Visit (INDEPENDENT_AMBULATORY_CARE_PROVIDER_SITE_OTHER): Payer: PPO | Admitting: Internal Medicine

## 2016-04-14 DIAGNOSIS — I4891 Unspecified atrial fibrillation: Secondary | ICD-10-CM

## 2016-04-14 DIAGNOSIS — Z952 Presence of prosthetic heart valve: Secondary | ICD-10-CM

## 2016-04-14 DIAGNOSIS — Z5181 Encounter for therapeutic drug level monitoring: Secondary | ICD-10-CM

## 2016-04-22 DIAGNOSIS — Z5181 Encounter for therapeutic drug level monitoring: Secondary | ICD-10-CM | POA: Diagnosis not present

## 2016-04-22 DIAGNOSIS — Z954 Presence of other heart-valve replacement: Secondary | ICD-10-CM | POA: Diagnosis not present

## 2016-04-22 DIAGNOSIS — I4891 Unspecified atrial fibrillation: Secondary | ICD-10-CM | POA: Diagnosis not present

## 2016-04-22 DIAGNOSIS — I359 Nonrheumatic aortic valve disorder, unspecified: Secondary | ICD-10-CM | POA: Diagnosis not present

## 2016-04-22 LAB — PROTIME-INR: INR: 4.8 — AB (ref 0.9–1.1)

## 2016-04-23 ENCOUNTER — Ambulatory Visit (INDEPENDENT_AMBULATORY_CARE_PROVIDER_SITE_OTHER): Payer: PPO | Admitting: Cardiology

## 2016-04-23 DIAGNOSIS — I4891 Unspecified atrial fibrillation: Secondary | ICD-10-CM

## 2016-04-23 DIAGNOSIS — Z5181 Encounter for therapeutic drug level monitoring: Secondary | ICD-10-CM

## 2016-04-23 DIAGNOSIS — Z952 Presence of prosthetic heart valve: Secondary | ICD-10-CM

## 2016-05-05 DIAGNOSIS — I359 Nonrheumatic aortic valve disorder, unspecified: Secondary | ICD-10-CM | POA: Diagnosis not present

## 2016-05-05 DIAGNOSIS — Z954 Presence of other heart-valve replacement: Secondary | ICD-10-CM | POA: Diagnosis not present

## 2016-05-05 DIAGNOSIS — I4891 Unspecified atrial fibrillation: Secondary | ICD-10-CM | POA: Diagnosis not present

## 2016-05-05 DIAGNOSIS — Z5181 Encounter for therapeutic drug level monitoring: Secondary | ICD-10-CM | POA: Diagnosis not present

## 2016-05-05 LAB — PROTIME-INR: INR: 2.5 — AB (ref 0.9–1.1)

## 2016-05-07 ENCOUNTER — Ambulatory Visit (INDEPENDENT_AMBULATORY_CARE_PROVIDER_SITE_OTHER): Payer: PPO | Admitting: Cardiovascular Disease

## 2016-05-07 DIAGNOSIS — Z952 Presence of prosthetic heart valve: Secondary | ICD-10-CM

## 2016-05-07 DIAGNOSIS — I4891 Unspecified atrial fibrillation: Secondary | ICD-10-CM

## 2016-05-07 DIAGNOSIS — Z5181 Encounter for therapeutic drug level monitoring: Secondary | ICD-10-CM

## 2016-05-12 ENCOUNTER — Ambulatory Visit: Payer: PPO | Admitting: *Deleted

## 2016-05-12 NOTE — Progress Notes (Deleted)
Pre visit review using our clinic review tool, if applicable. No additional management support is needed unless otherwise documented below in the visit note. 

## 2016-05-12 NOTE — Progress Notes (Deleted)
Subjective:   [name] is a 65 y.o. female who presents for an Initial Medicare Annual Wellness Visit.  Review of Systems    No ROS.  Medicare Wellness Visit.     Sleep patterns: {SX; SLEEP PATTERNS:18802}.   Home Safety/Smoke Alarms:   Living environment; residence and Firearm Safety: {Rehab home environment / accessibility:30080}. Seat Belt Safety/Bike Helmet:   Counseling:   Eye Exam-  Dental-  Female:   Pap-       Mammo-       Dexa scan-        CCS-     Objective:    There were no vitals filed for this visit. There is no height or weight on file to calculate BMI.   Current Medications (verified) Outpatient Encounter Prescriptions as of 05/12/2016  Medication Sig  . acetaminophen (TYLENOL) 500 MG tablet Take 1,000-1,500 mg by mouth every 6 (six) hours as needed (For knee pain.).  Marland Kitchen aspirin EC 81 MG tablet Take 81 mg by mouth daily.  . clonazePAM (KLONOPIN) 0.5 MG tablet Take 0.5 mg by mouth 2 (two) times daily.   . colchicine 0.6 MG tablet Take 1 tablet (0.6 mg total) by mouth daily. (Patient not taking: Reported on 01/21/2016)  . cyclobenzaprine (FLEXERIL) 10 MG tablet Take 10 mg by mouth 3 (three) times daily as needed for muscle spasms.  . Estradiol 10 MCG TABS vaginal tablet One tablet in vagina daily for 2 weeks then 3 times a week for total of 8 weeks (Patient not taking: Reported on 01/21/2016)  . fenofibrate 160 MG tablet Take 160 mg by mouth daily.  . furosemide (LASIX) 40 MG tablet Take 1 tablet (40 mg total) by mouth daily.  Marland Kitchen glucose blood test strip Use once daily to check blood sugar.  DX E11.9  . lamoTRIgine (LAMICTAL) 200 MG tablet Take 200 mg by mouth at bedtime.  . metoprolol succinate (TOPROL-XL) 25 MG 24 hr tablet TAKE ONE TABLET BY MOUTH EVERY MORNING  . ONE TOUCH LANCETS MISC Test once daily to check blood sugar. DX E11.9  . oxyCODONE (OXY IR/ROXICODONE) 5 MG immediate release tablet Take 1 tablet (5 mg total) by mouth every 4 (four) hours  as needed for moderate pain.  . pantoprazole (PROTONIX) 40 MG tablet TAKE 1 TABLET (40 MG TOTAL) BY MOUTH DAILY. (Patient taking differently: Take 40 mg by mouth daily. TAKE 1 TABLET (40 MG TOTAL) BY MOUTH DAILY.)  . Polyethyl Glycol-Propyl Glycol (SYSTANE OP) Place 1 drop into both eyes at bedtime as needed (For dry eyes.).   Marland Kitchen Probiotic Product (ALIGN) 4 MG CAPS Take 4 mg by mouth daily.  . rosuvastatin (CRESTOR) 40 MG tablet TAKE 1 TABLET (40 MG TOTAL) BY MOUTH DAILY.  . SEROQUEL XR 400 MG 24 hr tablet Take 400 mg by mouth at bedtime.   Marland Kitchen venlafaxine XR (EFFEXOR-XR) 150 MG 24 hr capsule Take 150 mg by mouth every morning.  . warfarin (COUMADIN) 5 MG tablet TAKE AS DIRECED BY [facility]   No facility-administered encounter medications on file as of 05/12/2016.     Allergies (verified) Keflex [cephalexin] and Sulfonamide derivatives   History: Past Medical History:  Diagnosis Date  . Abdominal pain, generalized 03/19/2010  . Acute bronchitis 07/22/2010  . Anemia   . ANEMIA 05/28/2010  . Anxiety   . AORTIC VALVE REPLACEMENT, HX OF 03/11/2010  . Arm lesion 03/23/2011  . Arthritis   . Bacterial vaginitis 11/18/2010  . BIPOLAR AFFECTIVE DISORDER 03/11/2010  .  Breast pain in female 11/17/2011  . Cervical cancer screening 03/22/2012  . Chest pain 11/17/2011  . CHICKENPOX, HX OF 03/11/2010  . CONSTIPATION 03/11/2010  . Depression   . Diarrhea 08/05/2010  . Diverticulitis 12/18/2010  . Diverticulosis   . Dysrhythmia   . Elevated cholesterol   . Fatigue 12/30/2010  . FIBROIDS, UTERUS 03/11/2010  . Foot fracture, left 03/22/2012  . Foot pain, left 03/22/2012  . Grave's disease 8-12  . Heart murmur   . HEART MURMUR, HX OF 03/11/2010  . Hematoma 12/18/2011  . Hematuria 12/18/2010  . Hypertension   . Hyperthyroidism 12/18/2010  . HYPERTRIGLYCERIDEMIA 03/11/2010  . KNEE PAIN, RIGHT 05/28/2010  . MEASLES, HX OF 03/11/2010  . Mixed hyperlipidemia 03/11/2010  . Obesity   .  Overweight(278.02) 03/11/2010  . Peripheral edema 11/18/2010  . Preop examination 02/03/2013  . Renal insufficiency 12/30/2010  . Shortness of breath    with exertion   . THYROMEGALY 07/22/2010  . Tick bite of back 10/15/2012  . Urinary tract bacterial infections   . UTI (lower urinary tract infection) 08/14/2011   Past Surgical History:  Procedure Laterality Date  . ABDOMINAL HYSTERECTOMY  ?1998   sb/l spo, total for fibroids/heavy bleeding  . AORTIC VALVE REPLACEMENT    . APPENDECTOMY     Required revision for EColi infection, required recurrent packing  . bowel obstruction     Requiring adhesions to be removed  . childhood exploratory surgery of heart    . CHOLECYSTECTOMY    . COLONOSCOPY WITH PROPOFOL N/A 11/01/2015   Procedure: COLONOSCOPY WITH PROPOFOL;  Surgeon: Milus Banister, MD;  Location: [facility];  Service: Endoscopy;  Laterality: N/A;  . CYSTOCELE REPAIR N/A 10/16/2015   Procedure: ANTERIOR REPAIR (CYSTOCELE);  Surgeon: Donnamae Jude, MD;  Location: [facility];  Service: Gynecology;  Laterality: N/A;  . HERNIA REPAIR  08-16-10  . LEFT HEART CATHETERIZATION WITH CORONARY ANGIOGRAM N/A 12/05/2011   Procedure: LEFT HEART CATHETERIZATION WITH CORONARY ANGIOGRAM;  Surgeon: Sinclair Grooms, MD;  Location: [facility] CATH [facility];  Service: Cardiovascular;  Laterality: N/A;  . open heart with a cardiac aneurysm repair    . TOTAL KNEE ARTHROPLASTY Right 02/24/2013   Procedure: RIGHT TOTAL KNEE ARTHROPLASTY;  Surgeon: Johnn Hai, MD;  Location: [facility];  Service: Orthopedics;  Laterality: Right;  . TUBAL LIGATION    . varicose vein surgery b/l legs     Family History  Problem Relation Age of Onset  . Scoliosis Mother   . Arthritis Mother     Rheumatoid  . Aneurysm Mother     heart  . Alcohol abuse Father   . Depression Sister   . Depression Brother   . Alcohol abuse Brother   . Cancer Maternal Grandmother     colon  . Diabetes Maternal Grandfather   . Heart disease Maternal  Grandfather   . Alcohol abuse Maternal Grandfather   . Depression Paternal Grandmother   . Diabetes Paternal Grandmother   . Depression Paternal Grandfather   . Diabetes Paternal Grandfather   . Depression Sister   . Alcohol abuse Brother   . Depression Brother   . Heart disease Brother    Social History   Occupational History  . Not on file.   Social History Main Topics  . Smoking status: Former Smoker    Quit date: 05/26/1990  . Smokeless tobacco: Never Used  . Alcohol use 0.0 oz/week     Comment: rare  . Drug use:  No  . Sexual activity: No    Tobacco Counseling Counseling given: Not Answered   Activities of Daily Living In your present state of health, do you have any difficulty performing the following activities: 11/29/2015 10/16/2015  Hearing? N N  Vision? N N  Difficulty concentrating or making decisions? N N  Walking or climbing stairs? Y N  Dressing or bathing? N N  Doing errands, shopping? N -  Some recent data might be hidden    Immunizations and Health Maintenance Immunization History  Administered Date(s) Administered  . Influenza Split 02/13/2011, 02/20/2012  . Influenza Whole 03/11/2010  . Influenza,inj,Quad PF,36+ Mos 02/03/2013, 02/24/2014, 04/03/2015  . Pneumococcal Conjugate-13 02/24/2014  . Pneumococcal Polysaccharide-23 02/28/2009, 03/22/2012  . Td 03/11/2010  . Zoster 03/15/2012   Health Maintenance Due  Topic Date Due  . Hepatitis C Screening  01/14/1951  . FOOT EXAM  11/28/1960  . OPHTHALMOLOGY EXAM  11/28/1960  . URINE MICROALBUMIN  11/28/1960  . HIV Screening  11/28/1965  . PAP SMEAR  03/23/2015  . HEMOGLOBIN A1C  10/22/2015  . INFLUENZA VACCINE  12/25/2015    Patient Care Team: Mosie Lukes, MD as PCP - General  Indicate any recent Medical Services you may have received from other than [facility] providers in the past year (date may be approximate).     Assessment:   This is a routine wellness examination for [name]. Physical  assessment deferred to PCP.  Hearing/Vision screen No exam data present  Dietary issues and exercise activities discussed:     Diet (meal preparation, eat out, water intake, caffeinated beverages, dairy products, fruits and vegetables): {Desc; diets:16563} Breakfast: Lunch:  Dinner:      Goals    None     Depression Screen PHQ 2/9 Scores 12/05/2015  PHQ - 2 Score 0    Fall Risk Fall Risk  12/05/2015  Falls in the past year? Yes  Number falls in past yr: 1  Injury with Fall? No  Follow up Falls evaluation completed    Cognitive Function:        Screening Tests Health Maintenance  Topic Date Due  . Hepatitis C Screening  1950/12/28  . FOOT EXAM  11/28/1960  . OPHTHALMOLOGY EXAM  11/28/1960  . URINE MICROALBUMIN  11/28/1960  . HIV Screening  11/28/1965  . PAP SMEAR  03/23/2015  . HEMOGLOBIN A1C  10/22/2015  . INFLUENZA VACCINE  12/25/2015  . PNA vac Low Risk Adult (2 of 2 - PPSV23) 03/22/2017  . MAMMOGRAM  04/02/2017  . TETANUS/TDAP  03/11/2020  . COLONOSCOPY  10/31/2025  . DEXA SCAN  Completed  . ZOSTAVAX  Completed      Plan:    Follow-up w/ Dr. Charlett Blake as scheduled.  During the course of the visit, [name] was educated and counseled about the following appropriate screening and preventive services:   Vaccines to include Pneumoccal, Influenza, Hepatitis B, Td, Zostavax, HCV  Electrocardiogram  Cardiovascular disease screening  Colorectal cancer screening  Bone density screening  Diabetes screening  Glaucoma screening  Mammography/PAP  Nutrition counseling  Smoking cessation counseling  Patient Instructions (the written plan) were given to the patient.    Dorrene German, RN   05/12/2016

## 2016-05-13 ENCOUNTER — Ambulatory Visit: Payer: PPO | Admitting: Family Medicine

## 2016-05-15 ENCOUNTER — Other Ambulatory Visit: Payer: Self-pay | Admitting: Family Medicine

## 2016-05-28 ENCOUNTER — Ambulatory Visit (INDEPENDENT_AMBULATORY_CARE_PROVIDER_SITE_OTHER): Payer: PPO | Admitting: *Deleted

## 2016-05-28 DIAGNOSIS — I4891 Unspecified atrial fibrillation: Secondary | ICD-10-CM | POA: Diagnosis not present

## 2016-05-28 DIAGNOSIS — Z5181 Encounter for therapeutic drug level monitoring: Secondary | ICD-10-CM

## 2016-05-28 DIAGNOSIS — Z952 Presence of prosthetic heart valve: Secondary | ICD-10-CM

## 2016-05-28 LAB — POCT INR: INR: 3.5

## 2016-06-05 ENCOUNTER — Encounter: Payer: Self-pay | Admitting: Family Medicine

## 2016-06-05 ENCOUNTER — Ambulatory Visit (INDEPENDENT_AMBULATORY_CARE_PROVIDER_SITE_OTHER): Payer: PPO | Admitting: Family Medicine

## 2016-06-05 VITALS — BP 132/76 | HR 77 | Temp 98.0°F | Wt 254.4 lb

## 2016-06-05 DIAGNOSIS — I4891 Unspecified atrial fibrillation: Secondary | ICD-10-CM | POA: Diagnosis not present

## 2016-06-05 DIAGNOSIS — D649 Anemia, unspecified: Secondary | ICD-10-CM | POA: Diagnosis not present

## 2016-06-05 DIAGNOSIS — E782 Mixed hyperlipidemia: Secondary | ICD-10-CM | POA: Diagnosis not present

## 2016-06-05 DIAGNOSIS — E785 Hyperlipidemia, unspecified: Secondary | ICD-10-CM

## 2016-06-05 DIAGNOSIS — G43909 Migraine, unspecified, not intractable, without status migrainosus: Secondary | ICD-10-CM

## 2016-06-05 DIAGNOSIS — R739 Hyperglycemia, unspecified: Secondary | ICD-10-CM

## 2016-06-05 DIAGNOSIS — R112 Nausea with vomiting, unspecified: Secondary | ICD-10-CM

## 2016-06-05 DIAGNOSIS — Z23 Encounter for immunization: Secondary | ICD-10-CM | POA: Diagnosis not present

## 2016-06-05 DIAGNOSIS — I10 Essential (primary) hypertension: Secondary | ICD-10-CM

## 2016-06-05 DIAGNOSIS — Z952 Presence of prosthetic heart valve: Secondary | ICD-10-CM

## 2016-06-05 DIAGNOSIS — E0590 Thyrotoxicosis, unspecified without thyrotoxic crisis or storm: Secondary | ICD-10-CM

## 2016-06-05 DIAGNOSIS — H53143 Visual discomfort, bilateral: Secondary | ICD-10-CM

## 2016-06-05 DIAGNOSIS — G43C0 Periodic headache syndromes in child or adult, not intractable: Secondary | ICD-10-CM

## 2016-06-05 DIAGNOSIS — E1169 Type 2 diabetes mellitus with other specified complication: Secondary | ICD-10-CM

## 2016-06-05 DIAGNOSIS — F319 Bipolar disorder, unspecified: Secondary | ICD-10-CM

## 2016-06-05 DIAGNOSIS — Z0000 Encounter for general adult medical examination without abnormal findings: Secondary | ICD-10-CM | POA: Insufficient documentation

## 2016-06-05 DIAGNOSIS — F40298 Other specified phobia: Secondary | ICD-10-CM

## 2016-06-05 HISTORY — DX: Hyperlipidemia, unspecified: E78.5

## 2016-06-05 HISTORY — DX: Type 2 diabetes mellitus with other specified complication: E11.69

## 2016-06-05 HISTORY — DX: Migraine, unspecified, not intractable, without status migrainosus: G43.909

## 2016-06-05 LAB — COMPREHENSIVE METABOLIC PANEL
ALT: 31 U/L (ref 0–35)
AST: 26 U/L (ref 0–37)
Albumin: 4.3 g/dL (ref 3.5–5.2)
Alkaline Phosphatase: 71 U/L (ref 39–117)
BUN: 13 mg/dL (ref 6–23)
CO2: 26 mEq/L (ref 19–32)
Calcium: 9.4 mg/dL (ref 8.4–10.5)
Chloride: 102 mEq/L (ref 96–112)
Creatinine, Ser: 0.87 mg/dL (ref 0.40–1.20)
GFR: 69.34 mL/min (ref >60.00)
Glucose, Bld: 195 mg/dL — ABNORMAL HIGH (ref 70–99)
Potassium: 4 mEq/L (ref 3.5–5.1)
Sodium: 138 mEq/L (ref 135–145)
Total Bilirubin: 0.5 mg/dL (ref 0.2–1.2)
Total Protein: 7.3 g/dL (ref 6.0–8.3)

## 2016-06-05 LAB — LIPID PANEL
Cholesterol: 184 mg/dL (ref 0–200)
HDL: 49.3 mg/dL (ref >39.00)
Total CHOL/HDL Ratio: 4
Triglycerides: 426 mg/dL — ABNORMAL HIGH (ref 0.0–149.0)

## 2016-06-05 LAB — CBC
HCT: 38.9 % (ref 36.0–46.0)
Hemoglobin: 13.2 g/dL (ref 12.0–15.0)
MCHC: 33.9 g/dL (ref 30.0–36.0)
MCV: 88.7 fl (ref 78.0–100.0)
Platelets: 223 10*3/uL (ref 150.0–400.0)
RBC: 4.39 Mil/uL (ref 3.87–5.11)
RDW: 13.6 % (ref 11.5–15.5)
WBC: 6.9 10*3/uL (ref 4.0–10.5)

## 2016-06-05 LAB — TSH: TSH: 4.29 u[IU]/mL (ref 0.35–4.50)

## 2016-06-05 LAB — LDL CHOLESTEROL, DIRECT: Direct LDL: 74 mg/dL

## 2016-06-05 LAB — HEMOGLOBIN A1C: Hgb A1c MFr Bld: 6.4 % (ref 4.6–6.5)

## 2016-06-05 MED ORDER — PROMETHAZINE HCL 25 MG PO TABS: 25.0000 mg | ORAL_TABLET | Freq: Three times a day (TID) | ORAL | 1 refills | Status: DC | PRN

## 2016-06-05 MED ORDER — SUMATRIPTAN SUCCINATE 50 MG PO TABS: 50.0000 mg | ORAL_TABLET | ORAL | 1 refills | Status: DC | PRN

## 2016-06-05 MED FILL — SUMATRIPTAN SUCC 50 MG TAB: 50 | 30 days supply | Qty: 10 | Fill #0 | Status: TO

## 2016-06-05 NOTE — Assessment & Plan Note (Signed)
Her previous psychiatrist does not take medicare so she has been referred to other groups. She will let us know if she needs further help finding a new psychiatrist or help covering meds int he transition

## 2016-06-05 NOTE — Patient Instructions (Signed)

## 2016-06-05 NOTE — Progress Notes (Signed)
Subjective:    Patient ID: [name], female    DOB: 1951/02/18, 66 y.o.   MRN: [id number]  Chief Complaint  Patient presents with  . medicare well visit    HPI Patient is in today for Annual Wellness Visit and follow up on chronic medical concerns. She has [place] out of the [place] quite a bit this past year so has not been in in a while. She reports she had significant post menopausal bleeding requiring surgery and transfusion but work up did not reveal cancer. Since surgery she has not bled again and she is feeling better. The weakness and fatigue have improved some as the anemia improved. She still struggles with bipolar and has been managing with psychiatry but her psychiatrist will no longer take her insurance so she is trying to find a new one. No recent acute febrile illness but she has been having more frequent and violent migraine headaches which start behind the left eye and then generalize. She has nausea/vomiting/aura/photophobia/phonophobia and they last hours. She has been using Excedrine Migraine with minor improvement. They were more frequent in [facility] but have persisted since she has returned. Denies CP/palp/SOB/congestion/fevers/GI or GU c/o. Taking meds as prescribed. She is trying to maintain a heart healthy diet and is doing well with ADLs at home.  Past Medical History:  Diagnosis Date  . Abdominal pain, generalized 03/19/2010  . Acute bronchitis 07/22/2010  . Anemia   . ANEMIA 05/28/2010  . Anxiety   . AORTIC VALVE REPLACEMENT, HX OF 03/11/2010  . Arm lesion 03/23/2011  . Arthritis   . Bacterial vaginitis 11/18/2010  . BIPOLAR AFFECTIVE DISORDER 03/11/2010  . Breast pain in female 11/17/2011  . Cervical cancer screening 03/22/2012  . Chest pain 11/17/2011  . CHICKENPOX, HX OF 03/11/2010  . CONSTIPATION 03/11/2010  . Depression   . Diarrhea 08/05/2010  . Diverticulitis 12/18/2010  . Diverticulosis   . Dysrhythmia   . Elevated cholesterol   . Fatigue 12/30/2010  .  FIBROIDS, UTERUS 03/11/2010  . Foot fracture, left 03/22/2012  . Foot pain, left 03/22/2012  . Grave's disease 8-12  . Heart murmur   . HEART MURMUR, HX OF 03/11/2010  . Hematoma 12/18/2011  . Hematuria 12/18/2010  . Hyperglycemia 06/05/2016  . Hypertension   . Hyperthyroidism 12/18/2010  . HYPERTRIGLYCERIDEMIA 03/11/2010  . KNEE PAIN, RIGHT 05/28/2010  . MEASLES, HX OF 03/11/2010  . Medicare annual wellness visit, subsequent 06/05/2016  . Migraine 06/05/2016  . Mixed hyperlipidemia 03/11/2010  . Obesity   . Overweight(278.02) 03/11/2010  . Peripheral edema 11/18/2010  . Preop examination 02/03/2013  . Renal insufficiency 12/30/2010  . Shortness of breath    with exertion   . THYROMEGALY 07/22/2010  . Tick bite of back 10/15/2012  . Urinary tract bacterial infections   . UTI (lower urinary tract infection) 08/14/2011    Past Surgical History:  Procedure Laterality Date  . ABDOMINAL HYSTERECTOMY  ?1998   sb/l spo, total for fibroids/heavy bleeding  . AORTIC VALVE REPLACEMENT    . APPENDECTOMY     Required revision for EColi infection, required recurrent packing  . bowel obstruction     Requiring adhesions to be removed  . childhood exploratory surgery of heart    . CHOLECYSTECTOMY    . COLONOSCOPY WITH PROPOFOL N/A 11/01/2015   Procedure: COLONOSCOPY WITH PROPOFOL;  Surgeon: Milus Banister, MD;  Location: [facility];  Service: Endoscopy;  Laterality: N/A;  . CYSTOCELE REPAIR N/A 10/16/2015   Procedure: ANTERIOR  REPAIR (CYSTOCELE);  Surgeon: Donnamae Jude, MD;  Location: [facility];  Service: Gynecology;  Laterality: N/A;  . HERNIA REPAIR  08-16-10  . LEFT HEART CATHETERIZATION WITH CORONARY ANGIOGRAM N/A 12/05/2011   Procedure: LEFT HEART CATHETERIZATION WITH CORONARY ANGIOGRAM;  Surgeon: Sinclair Grooms, MD;  Location: [facility] CATH LAB;  Service: Cardiovascular;  Laterality: N/A;  . open heart with a cardiac aneurysm repair    . TOTAL KNEE ARTHROPLASTY Right 02/24/2013   Procedure: RIGHT  TOTAL KNEE ARTHROPLASTY;  Surgeon: Johnn Hai, MD;  Location: [facility];  Service: Orthopedics;  Laterality: Right;  . TUBAL LIGATION    . varicose vein surgery b/l legs      Family History  Problem Relation Age of Onset  . Scoliosis Mother   . Arthritis Mother     Rheumatoid  . Aneurysm Mother     heart  . Alcohol abuse Father   . Depression Sister   . Depression Brother   . Alcohol abuse Brother   . Cancer Maternal Grandmother     colon  . Diabetes Maternal Grandfather   . Heart disease Maternal Grandfather   . Alcohol abuse Maternal Grandfather   . Depression Paternal Grandmother   . Diabetes Paternal Grandmother   . Depression Paternal Grandfather   . Diabetes Paternal Grandfather   . Depression Sister   . Alcohol abuse Brother   . Depression Brother   . Heart disease Brother     Social History   Social History  . Marital status: Married    Spouse name: N/A  . Number of children: N/A  . Years of education: N/A   Occupational History  . Not on file.   Social History Main Topics  . Smoking status: Former Smoker    Quit date: 05/26/1990  . Smokeless tobacco: Never Used  . Alcohol use 0.0 oz/week     Comment: rare  . Drug use: No  . Sexual activity: No   Other Topics Concern  . Not on file   Social History Narrative  . No narrative on file    Outpatient Medications Prior to Visit  Medication Sig Dispense Refill  . acetaminophen (TYLENOL) 500 MG tablet Take 1,000-1,500 mg by mouth every 6 (six) hours as needed (For knee pain.).    Marland Kitchen aspirin EC 81 MG tablet Take 81 mg by mouth daily.    . clonazePAM (KLONOPIN) 0.5 MG tablet Take 0.5 mg by mouth 2 (two) times daily.     . colchicine 0.6 MG tablet Take 1 tablet (0.6 mg total) by mouth daily. (Patient not taking: Reported on 01/21/2016) 30 tablet 0  . cyclobenzaprine (FLEXERIL) 10 MG tablet Take 10 mg by mouth 3 (three) times daily as needed for muscle spasms.    . Estradiol 10 MCG TABS vaginal tablet One  tablet in vagina daily for 2 weeks then 3 times a week for total of 8 weeks (Patient not taking: Reported on 01/21/2016) 30 tablet 2  . fenofibrate 160 MG tablet Take 160 mg by mouth daily.    . furosemide (LASIX) 40 MG tablet Take 1 tablet (40 mg total) by mouth daily. 30 tablet 11  . glucose blood test strip Use once daily to check blood sugar.  DX E11.9 100 each 3  . lamoTRIgine (LAMICTAL) 200 MG tablet Take 200 mg by mouth at bedtime.    . metoprolol succinate (TOPROL-XL) 25 MG 24 hr tablet TAKE ONE TABLET BY MOUTH EVERY MORNING 30 tablet 3  .  ONE TOUCH LANCETS MISC Test once daily to check blood sugar. DX E11.9 100 each 3  . oxyCODONE (OXY IR/ROXICODONE) 5 MG immediate release tablet Take 1 tablet (5 mg total) by mouth every 4 (four) hours as needed for moderate pain. 30 tablet 0  . pantoprazole (PROTONIX) 40 MG tablet TAKE ONE TABLET BY MOUTH DAILY 30 tablet 4  . Polyethyl Glycol-Propyl Glycol (SYSTANE OP) Place 1 drop into both eyes at bedtime as needed (For dry eyes.).     Marland Kitchen Probiotic Product (ALIGN) 4 MG CAPS Take 4 mg by mouth daily.    . rosuvastatin (CRESTOR) 40 MG tablet TAKE 1 TABLET (40 MG TOTAL) BY MOUTH DAILY. 30 tablet 5  . SEROQUEL XR 400 MG 24 hr tablet Take 400 mg by mouth at bedtime.   1  . venlafaxine XR (EFFEXOR-XR) 150 MG 24 hr capsule Take 150 mg by mouth every morning.    . warfarin (COUMADIN) 5 MG tablet TAKE AS DIRECED BY [facility] 30 tablet 3   No facility-administered medications prior to visit.     Allergies  Allergen Reactions  . Mucinex [Guaifenesin Er] Other (See Comments)    Severe Headaches  . Keflex [Cephalexin] Other (See Comments)    Headaches,Dizziness  . Sulfonamide Derivatives Other (See Comments)    headaches    Review of Systems  Constitutional: Positive for malaise/fatigue. Negative for fever.  HENT: Negative for congestion.   Eyes: Positive for photophobia. Negative for blurred vision, double vision, pain and redness.  Respiratory:  Negative for shortness of breath.   Cardiovascular: Negative for chest pain, palpitations and leg swelling.  Gastrointestinal: Positive for nausea and vomiting. Negative for abdominal pain and blood in stool.  Genitourinary: Negative for dysuria and frequency.  Musculoskeletal: Negative for falls.  Skin: Negative for rash.  Neurological: Positive for headaches. Negative for dizziness and loss of consciousness.  Endo/Heme/Allergies: Negative for environmental allergies.  Psychiatric/Behavioral: Positive for depression. Negative for substance abuse and suicidal ideas. The patient is not nervous/anxious.        Objective:    Physical Exam  Constitutional: She is oriented to person, place, and time. She appears well-developed and well-nourished. No distress.  HENT:  Head: Normocephalic and atraumatic.  Eyes: Conjunctivae are normal.  Neck: Neck supple. No thyromegaly present.  Cardiovascular: Normal rate and regular rhythm.   Murmur heard. Mechanical click  Pulmonary/Chest: Effort normal and breath sounds normal. No respiratory distress.  Abdominal: Soft. Bowel sounds are normal. She exhibits no distension and no mass. There is no tenderness.  Musculoskeletal: She exhibits no edema.  Lymphadenopathy:    She has no cervical adenopathy.  Neurological: She is alert and oriented to person, place, and time.  Skin: Skin is warm and dry.  Psychiatric: She has a normal mood and affect. Her behavior is normal.    BP 132/76 (BP Location: Left Arm, Patient Position: Sitting, Cuff Size: Normal)   Pulse 77   Temp 98 F (36.7 C) (Oral)   Wt 254 lb 6.4 oz (115.4 kg)   SpO2 98%   BMI 38.68 kg/m  Wt Readings from Last 3 Encounters:  06/05/16 254 lb 6.4 oz (115.4 kg)  01/21/16 234 lb 6.4 oz (106.3 kg)  01/18/16 237 lb (107.5 kg)     Lab Results  Component Value Date   WBC 7.9 11/30/2015   HGB 11.4 (L) 11/30/2015   HCT 35.0 (L) 11/30/2015   PLT 221 11/30/2015   GLUCOSE 155 (H)  11/30/2015   CHOL  245 (H) 04/03/2015   TRIG (H) 04/03/2015    667.0 Triglyceride is over 400; calculations on Lipids are invalid.   HDL 46.00 04/03/2015   LDLDIRECT 130.0 04/03/2015   LDLCALC 71 04/04/2013   ALT 32 04/03/2015   AST 23 04/03/2015   NA 137 11/30/2015   K 4.6 11/30/2015   CL 106 11/30/2015   CREATININE 0.68 11/30/2015   BUN 12 11/30/2015   CO2 24 11/30/2015   TSH 3.38 04/03/2015   INR 3.5 05/28/2016   HGBA1C 6.5 04/24/2015    Lab Results  Component Value Date   TSH 3.38 04/03/2015   Lab Results  Component Value Date   WBC 7.9 11/30/2015   HGB 11.4 (L) 11/30/2015   HCT 35.0 (L) 11/30/2015   MCV 91.1 11/30/2015   PLT 221 11/30/2015   Lab Results  Component Value Date   NA 137 11/30/2015   K 4.6 11/30/2015   CO2 24 11/30/2015   GLUCOSE 155 (H) 11/30/2015   BUN 12 11/30/2015   CREATININE 0.68 11/30/2015   BILITOT 0.4 04/03/2015   ALKPHOS 69 04/03/2015   AST 23 04/03/2015   ALT 32 04/03/2015   PROT 7.2 04/03/2015   ALBUMIN 4.3 04/03/2015   CALCIUM 9.1 11/30/2015   ANIONGAP 7 11/30/2015   GFR 68.68 04/03/2015   Lab Results  Component Value Date   CHOL 245 (H) 04/03/2015   Lab Results  Component Value Date   HDL 46.00 04/03/2015   Lab Results  Component Value Date   LDLCALC 71 04/04/2013   Lab Results  Component Value Date   TRIG (H) 04/03/2015    667.0 Triglyceride is over 400; calculations on Lipids are invalid.   Lab Results  Component Value Date   CHOLHDL 5 04/03/2015   Lab Results  Component Value Date   HGBA1C 6.5 04/24/2015       Assessment & Plan:   Problem List Items Addressed This Visit    MIXED HYPERLIPIDEMIA    Encouraged heart healthy diet, increase exercise, avoid trans fats, consider a krill oil cap daily      Relevant Orders   Lipid panel   Bipolar disorder [facility])    Her previous psychiatrist does not take medicare so she has been referred to other groups. She will let us know if she needs further help  finding a new psychiatrist or help covering meds int he transition      Hx of mechanical aortic valve replacement    Tolerating coumadin      Anemia    Severe while having postmenopausal bleeding. Doing better now but will check CBC today to monitor      Relevant Orders   CBC   Hyperthyroidism   Atrial fibrillation ([place]) - Primary   Essential hypertension, benign    Well controlled, no changes to meds. Encouraged heart healthy diet such as the DASH diet and exercise as tolerated.       Relevant Orders   Comprehensive metabolic panel   TSH   Hyperglycemia    hgba1c checked, minimize simple carbs. Increase exercise as tolerated.      Relevant Orders   Hemoglobin A1c   Medicare annual wellness visit, subsequent    Patient denies any difficulties at home. No trouble with ADLs, depression or falls. See EMR for functional status screen and depression screen. No recent changes to vision or hearing. Is UTD with immunizations. Is UTD with screening. Discussed Advanced Directives. Encouraged heart healthy diet, exercise as tolerated and adequate  sleep. See patient's problem list for health risk factors to monitor. See AVS for preventative healthcare recommendation schedule.        Migraine    Had them many years ago but now have returned and are severe. Given rx for Imitrex and Phenergan and CT head ordered due to intensity of symptoms and new onset.       Relevant Medications   SUMAtriptan (IMITREX) 50 MG tablet   Other Relevant Orders   CT Head Wo Contrast    Other Visit Diagnoses    Encounter for immunization       Relevant Orders   Flu vaccine HIGH DOSE PF (Completed)   CBC   Comprehensive metabolic panel   Hemoglobin A1c   Lipid panel   TSH   Photophobia of both eyes       Relevant Orders   CT Head Wo Contrast   Nausea and vomiting, intractability of vomiting not specified, unspecified vomiting type       Relevant Orders   CT Head Wo Contrast   Phonophobia         Relevant Orders   CT Head Wo Contrast      I have discontinued Ms. Ulbrich's QUEtiapine. I am also having her start on SUMAtriptan and promethazine. Additionally, I am having her maintain her lamoTRIgine, clonazePAM, glucose blood, ONE TOUCH LANCETS, SEROQUEL XR, furosemide, aspirin EC, Estradiol, cyclobenzaprine, fenofibrate, ALIGN, Polyethyl Glycol-Propyl Glycol (SYSTANE OP), venlafaxine XR, acetaminophen, colchicine, oxyCODONE, metoprolol succinate, rosuvastatin, warfarin, and pantoprazole. CMA served as Education administrator during this visit. History, Physical and Plan performed by medical provider. Documentation and orders reviewed and attested to.  Meds ordered this encounter  Medications  . DISCONTD: QUEtiapine (SEROQUEL XR) 300 MG 24 hr tablet    Sig: Take 300 mg by mouth at bedtime.  . SUMAtriptan (IMITREX) 50 MG tablet    Sig: Take 1 tablet (50 mg total) by mouth every 2 (two) hours as needed for migraine (max 2 tabs in 24 hours). May repeat in 2 hours if headache persists or recurs.    Dispense:  10 tablet    Refill:  1  . promethazine (PHENERGAN) 25 MG tablet    Sig: Take 1 tablet (25 mg total) by mouth every 8 (eight) hours as needed for nausea or vomiting.    Dispense:  20 tablet    Refill:  1     Penni Homans, MD

## 2016-06-05 NOTE — Assessment & Plan Note (Signed)
Tolerating coumadin 

## 2016-06-05 NOTE — Assessment & Plan Note (Signed)
Had them many years ago but now have returned and are severe. Given rx for Imitrex and Phenergan and CT head ordered due to intensity of symptoms and new onset.

## 2016-06-05 NOTE — Assessment & Plan Note (Signed)
Severe while having postmenopausal bleeding. Doing better now but will check CBC today to monitor

## 2016-06-05 NOTE — Assessment & Plan Note (Signed)
Encouraged heart healthy diet, increase exercise, avoid trans fats, consider a krill oil cap daily 

## 2016-06-05 NOTE — Assessment & Plan Note (Signed)
hgba1c checked, minimize simple carbs. Increase exercise as tolerated.

## 2016-06-05 NOTE — Progress Notes (Signed)
132Pre visit review using our clinic review tool, if applicable. No additional management support is needed unless otherwise documented below in the visit note.

## 2016-06-05 NOTE — Assessment & Plan Note (Signed)
Well controlled, no changes to meds. Encouraged heart healthy diet such as the DASH diet and exercise as tolerated.  °

## 2016-06-05 NOTE — Assessment & Plan Note (Signed)
Patient denies any difficulties at home. No trouble with ADLs, depression or falls. See EMR for functional status screen and depression screen. No recent changes to vision or hearing. Is UTD with immunizations. Is UTD with screening. Discussed Advanced Directives. Encouraged heart healthy diet, exercise as tolerated and adequate sleep. See patient's problem list for health risk factors to monitor. See AVS for preventative healthcare recommendation schedule. 

## 2016-06-10 ENCOUNTER — Ambulatory Visit (HOSPITAL_BASED_OUTPATIENT_CLINIC_OR_DEPARTMENT_OTHER)
Admission: RE | Admit: 2016-06-10 | Discharge: 2016-06-10 | Disposition: A | Discharge status: Home / Self Care | Payer: PPO | Source: Ambulatory Visit | Attending: Family Medicine | Admitting: Family Medicine

## 2016-06-10 DIAGNOSIS — H53143 Visual discomfort, bilateral: Secondary | ICD-10-CM | POA: Diagnosis not present

## 2016-06-10 DIAGNOSIS — R112 Nausea with vomiting, unspecified: Secondary | ICD-10-CM

## 2016-06-10 DIAGNOSIS — F40298 Other specified phobia: Secondary | ICD-10-CM | POA: Insufficient documentation

## 2016-06-10 DIAGNOSIS — R51 Headache: Secondary | ICD-10-CM | POA: Diagnosis not present

## 2016-06-10 DIAGNOSIS — G43C0 Periodic headache syndromes in child or adult, not intractable: Secondary | ICD-10-CM | POA: Diagnosis not present

## 2016-06-12 ENCOUNTER — Other Ambulatory Visit: Payer: Self-pay | Admitting: Interventional Cardiology

## 2016-06-13 ENCOUNTER — Telehealth: Payer: Self-pay

## 2016-06-13 NOTE — Telephone Encounter (Signed)
Awaiting response

## 2016-06-16 NOTE — Telephone Encounter (Signed)
Received PA approval for Promethazine. From 06/13/2016 through 05/25/2017 (PA [id number])

## 2016-06-18 ENCOUNTER — Ambulatory Visit (INDEPENDENT_AMBULATORY_CARE_PROVIDER_SITE_OTHER): Payer: PPO | Admitting: *Deleted

## 2016-06-18 DIAGNOSIS — Z952 Presence of prosthetic heart valve: Secondary | ICD-10-CM | POA: Diagnosis not present

## 2016-06-18 DIAGNOSIS — Z5181 Encounter for therapeutic drug level monitoring: Secondary | ICD-10-CM

## 2016-06-18 DIAGNOSIS — I4891 Unspecified atrial fibrillation: Secondary | ICD-10-CM

## 2016-06-18 LAB — POCT INR: INR: 1.5

## 2016-06-25 ENCOUNTER — Ambulatory Visit (INDEPENDENT_AMBULATORY_CARE_PROVIDER_SITE_OTHER): Payer: PPO | Admitting: *Deleted

## 2016-06-25 DIAGNOSIS — I4891 Unspecified atrial fibrillation: Secondary | ICD-10-CM | POA: Diagnosis not present

## 2016-06-25 DIAGNOSIS — Z5181 Encounter for therapeutic drug level monitoring: Secondary | ICD-10-CM | POA: Diagnosis not present

## 2016-06-25 DIAGNOSIS — Z952 Presence of prosthetic heart valve: Secondary | ICD-10-CM

## 2016-06-25 LAB — POCT INR: INR: 2.1

## 2016-07-07 DIAGNOSIS — F39 Unspecified mood [affective] disorder: Secondary | ICD-10-CM | POA: Diagnosis not present

## 2016-07-07 DIAGNOSIS — F419 Anxiety disorder, unspecified: Secondary | ICD-10-CM | POA: Diagnosis not present

## 2016-07-08 ENCOUNTER — Ambulatory Visit (INDEPENDENT_AMBULATORY_CARE_PROVIDER_SITE_OTHER): Payer: PPO | Admitting: *Deleted

## 2016-07-08 DIAGNOSIS — Z952 Presence of prosthetic heart valve: Secondary | ICD-10-CM

## 2016-07-08 DIAGNOSIS — Z5181 Encounter for therapeutic drug level monitoring: Secondary | ICD-10-CM | POA: Diagnosis not present

## 2016-07-08 DIAGNOSIS — I4891 Unspecified atrial fibrillation: Secondary | ICD-10-CM

## 2016-07-08 LAB — POCT INR: INR: 3.3

## 2016-07-10 ENCOUNTER — Other Ambulatory Visit: Payer: Self-pay | Admitting: Family Medicine

## 2016-07-22 ENCOUNTER — Ambulatory Visit (INDEPENDENT_AMBULATORY_CARE_PROVIDER_SITE_OTHER): Payer: PPO | Admitting: *Deleted

## 2016-07-22 DIAGNOSIS — Z5181 Encounter for therapeutic drug level monitoring: Secondary | ICD-10-CM | POA: Diagnosis not present

## 2016-07-22 DIAGNOSIS — Z952 Presence of prosthetic heart valve: Secondary | ICD-10-CM | POA: Diagnosis not present

## 2016-07-22 DIAGNOSIS — I4891 Unspecified atrial fibrillation: Secondary | ICD-10-CM | POA: Diagnosis not present

## 2016-07-22 LAB — POCT INR: INR: 3.6

## 2016-08-15 ENCOUNTER — Ambulatory Visit: Payer: PPO | Admitting: Interventional Cardiology

## 2016-08-15 ENCOUNTER — Ambulatory Visit (INDEPENDENT_AMBULATORY_CARE_PROVIDER_SITE_OTHER): Payer: PPO | Admitting: *Deleted

## 2016-08-15 DIAGNOSIS — Z952 Presence of prosthetic heart valve: Secondary | ICD-10-CM

## 2016-08-15 DIAGNOSIS — Z5181 Encounter for therapeutic drug level monitoring: Secondary | ICD-10-CM | POA: Diagnosis not present

## 2016-08-15 DIAGNOSIS — I4891 Unspecified atrial fibrillation: Secondary | ICD-10-CM

## 2016-08-15 LAB — POCT INR: INR: 2.2

## 2016-08-20 ENCOUNTER — Ambulatory Visit: Payer: Self-pay | Admitting: Podiatry

## 2016-09-01 ENCOUNTER — Ambulatory Visit (INDEPENDENT_AMBULATORY_CARE_PROVIDER_SITE_OTHER): Payer: PPO | Admitting: *Deleted

## 2016-09-01 DIAGNOSIS — Z5181 Encounter for therapeutic drug level monitoring: Secondary | ICD-10-CM

## 2016-09-01 DIAGNOSIS — I4891 Unspecified atrial fibrillation: Secondary | ICD-10-CM | POA: Diagnosis not present

## 2016-09-01 DIAGNOSIS — Z952 Presence of prosthetic heart valve: Secondary | ICD-10-CM | POA: Diagnosis not present

## 2016-09-01 LAB — POCT INR: INR: 2.6

## 2016-09-04 ENCOUNTER — Encounter: Payer: Self-pay | Admitting: Family Medicine

## 2016-09-04 ENCOUNTER — Telehealth: Payer: Self-pay

## 2016-09-04 ENCOUNTER — Ambulatory Visit (HOSPITAL_BASED_OUTPATIENT_CLINIC_OR_DEPARTMENT_OTHER)
Admission: RE | Admit: 2016-09-04 | Discharge: 2016-09-04 | Disposition: A | Discharge status: Home / Self Care | Payer: PPO | Source: Ambulatory Visit | Attending: Family Medicine | Admitting: Family Medicine

## 2016-09-04 ENCOUNTER — Ambulatory Visit (INDEPENDENT_AMBULATORY_CARE_PROVIDER_SITE_OTHER): Payer: PPO | Admitting: Family Medicine

## 2016-09-04 VITALS — BP 126/76 | HR 74 | Temp 98.4°F | Resp 16

## 2016-09-04 DIAGNOSIS — I4891 Unspecified atrial fibrillation: Secondary | ICD-10-CM

## 2016-09-04 DIAGNOSIS — R739 Hyperglycemia, unspecified: Secondary | ICD-10-CM | POA: Diagnosis not present

## 2016-09-04 DIAGNOSIS — N289 Disorder of kidney and ureter, unspecified: Secondary | ICD-10-CM | POA: Diagnosis not present

## 2016-09-04 DIAGNOSIS — E0590 Thyrotoxicosis, unspecified without thyrotoxic crisis or storm: Secondary | ICD-10-CM | POA: Diagnosis not present

## 2016-09-04 DIAGNOSIS — M109 Gout, unspecified: Secondary | ICD-10-CM | POA: Diagnosis not present

## 2016-09-04 DIAGNOSIS — E785 Hyperlipidemia, unspecified: Secondary | ICD-10-CM

## 2016-09-04 DIAGNOSIS — M7731 Calcaneal spur, right foot: Secondary | ICD-10-CM | POA: Diagnosis not present

## 2016-09-04 DIAGNOSIS — E1169 Type 2 diabetes mellitus with other specified complication: Secondary | ICD-10-CM

## 2016-09-04 DIAGNOSIS — E782 Mixed hyperlipidemia: Secondary | ICD-10-CM | POA: Diagnosis not present

## 2016-09-04 DIAGNOSIS — F319 Bipolar disorder, unspecified: Secondary | ICD-10-CM

## 2016-09-04 DIAGNOSIS — M19071 Primary osteoarthritis, right ankle and foot: Secondary | ICD-10-CM | POA: Insufficient documentation

## 2016-09-04 HISTORY — DX: Gout, unspecified: M10.9

## 2016-09-04 MED ORDER — TRAMADOL HCL 50 MG PO TABS: 50.0000 mg | ORAL_TABLET | Freq: Three times a day (TID) | ORAL | 0 refills | Status: DC | PRN

## 2016-09-04 MED ORDER — FEBUXOSTAT 40 MG PO TABS: 40.0000 mg | ORAL_TABLET | Freq: Every day | ORAL | 2 refills | Status: DC

## 2016-09-04 MED FILL — traMADol HCL 50 MG TABS: 50 | 10 days supply | Qty: 30 | Fill #0

## 2016-09-04 NOTE — Patient Instructions (Signed)

## 2016-09-04 NOTE — Progress Notes (Signed)
Patient ID: [name], female   DOB: 1951-05-15, 66 y.o.   MRN: [id number]     Subjective:  I acted as a Education administrator for Dr. Charlett Blake.  Guerry Bruin, [place].    Patient ID: [name], female    DOB: [birth year], 66 y.o.   MRN: [id number]  Chief Complaint  Patient presents with  . Foot Pain    Foot Pain  This is a new problem. Episode onset: couple of weeks ago. The problem occurs constantly. Associated symptoms include myalgias, numbness and weakness. Pertinent negatives include no abdominal pain, anorexia, arthralgias, change in bowel habit, chest pain, chills, congestion, coughing, diaphoresis, fatigue, fever, headaches, joint swelling, nausea, neck pain, rash, sore throat, swollen glands, urinary symptoms, vertigo, visual change or vomiting. The symptoms are aggravated by walking. She has tried acetaminophen and immobilization for the symptoms. The treatment provided mild relief.    Patient is in today for foot pain.  She is having throbbing pain and pressure on the top of her foot. She denies any falls or trauma. She does note even mild touch weightbearing is significantly painful. She denies any fevers or chills. She is tearful in today's visit and very worried about her husband's failing health and her poor health. She is started with a [facility] but despite medication changes still feels tearful. Does not endorse suicidal ideation but does endorse anhedonia. No polyuria or polydipsia. Denies CP/palp/SOB/HA/congestion/fevers/GI or GU c/o. Taking meds as prescribed  Patient Care Team: Mosie Lukes, MD as PCP - General   Past Medical History:  Diagnosis Date  . Abdominal pain, generalized 03/19/2010  . Acute bronchitis 07/22/2010  . Anemia   . ANEMIA 05/28/2010  . Anxiety   . AORTIC VALVE REPLACEMENT, HX OF 03/11/2010  . Arm lesion 03/23/2011  . Arthritis   . Bacterial vaginitis 11/18/2010  . BIPOLAR AFFECTIVE DISORDER 03/11/2010  . Breast pain in female 11/17/2011    . Cervical cancer screening 03/22/2012  . Chest pain 11/17/2011  . CHICKENPOX, HX OF 03/11/2010  . CONSTIPATION 03/11/2010  . Depression   . Diarrhea 08/05/2010  . Diverticulitis 12/18/2010  . Diverticulosis   . Dysrhythmia   . Elevated cholesterol   . Fatigue 12/30/2010  . FIBROIDS, UTERUS 03/11/2010  . Foot fracture, left 03/22/2012  . Foot pain, left 03/22/2012  . Gout 09/04/2016  . Grave's disease 8-12  . Heart murmur   . HEART MURMUR, HX OF 03/11/2010  . Hematoma 12/18/2011  . Hematuria 12/18/2010  . Hyperglycemia 06/05/2016  . Hyperlipidemia associated with type 2 diabetes mellitus ([place]) 06/05/2016  . Hypertension   . Hyperthyroidism 12/18/2010  . HYPERTRIGLYCERIDEMIA 03/11/2010  . KNEE PAIN, RIGHT 05/28/2010  . MEASLES, HX OF 03/11/2010  . Medicare annual wellness visit, subsequent 06/05/2016  . Migraine 06/05/2016  . Mixed hyperlipidemia 03/11/2010  . Obesity   . Overweight(278.02) 03/11/2010  . Peripheral edema 11/18/2010  . Preop examination 02/03/2013  . Renal insufficiency 12/30/2010  . Shortness of breath    with exertion   . THYROMEGALY 07/22/2010  . Tick bite of back 10/15/2012  . Urinary tract bacterial infections   . UTI (lower urinary tract infection) 08/14/2011    Past Surgical History:  Procedure Laterality Date  . ABDOMINAL HYSTERECTOMY  ?1998   sb/l spo, total for fibroids/heavy bleeding  . AORTIC VALVE REPLACEMENT    . APPENDECTOMY     Required revision for EColi infection, required recurrent packing  . bowel obstruction     Requiring  adhesions to be removed  . childhood exploratory surgery of heart    . CHOLECYSTECTOMY    . COLONOSCOPY WITH PROPOFOL N/A 11/01/2015   Procedure: COLONOSCOPY WITH PROPOFOL;  Surgeon: Milus Banister, MD;  Location: [facility];  Service: Endoscopy;  Laterality: N/A;  . CYSTOCELE REPAIR N/A 10/16/2015   Procedure: ANTERIOR REPAIR (CYSTOCELE);  Surgeon: Donnamae Jude, MD;  Location: [facility];  Service: Gynecology;  Laterality: N/A;   . HERNIA REPAIR  08-16-10  . LEFT HEART CATHETERIZATION WITH CORONARY ANGIOGRAM N/A 12/05/2011   Procedure: LEFT HEART CATHETERIZATION WITH CORONARY ANGIOGRAM;  Surgeon: Sinclair Grooms, MD;  Location: [facility] CATH LAB;  Service: Cardiovascular;  Laterality: N/A;  . open heart with a cardiac aneurysm repair    . TOTAL KNEE ARTHROPLASTY Right 02/24/2013   Procedure: RIGHT TOTAL KNEE ARTHROPLASTY;  Surgeon: Johnn Hai, MD;  Location: [facility];  Service: Orthopedics;  Laterality: Right;  . TUBAL LIGATION    . varicose vein surgery b/l legs      Family History  Problem Relation Age of Onset  . Scoliosis Mother   . Arthritis Mother     Rheumatoid  . Aneurysm Mother     heart  . Alcohol abuse Father   . Depression Sister   . Depression Brother   . Alcohol abuse Brother   . Cancer Maternal Grandmother     colon  . Diabetes Maternal Grandfather   . Heart disease Maternal Grandfather   . Alcohol abuse Maternal Grandfather   . Depression Paternal Grandmother   . Diabetes Paternal Grandmother   . Depression Paternal Grandfather   . Diabetes Paternal Grandfather   . Depression Sister   . Alcohol abuse Brother   . Depression Brother   . Heart disease Brother     Social History   Social History  . Marital status: Married    Spouse name: N/A  . Number of children: N/A  . Years of education: N/A   Occupational History  . Not on file.   Social History Main Topics  . Smoking status: Former Smoker    Quit date: 05/26/1990  . Smokeless tobacco: Never Used  . Alcohol use 0.0 oz/week     Comment: rare  . Drug use: No  . Sexual activity: No   Other Topics Concern  . Not on file   Social History Narrative  . No narrative on file    Outpatient Medications Prior to Visit  Medication Sig Dispense Refill  . acetaminophen (TYLENOL) 500 MG tablet Take 1,000-1,500 mg by mouth every 6 (six) hours as needed (For knee pain.).    Marland Kitchen aspirin EC 81 MG tablet Take 81 mg by mouth daily.    .  clonazePAM (KLONOPIN) 0.5 MG tablet Take 0.5 mg by mouth 2 (two) times daily.     . cyclobenzaprine (FLEXERIL) 10 MG tablet Take 10 mg by mouth 3 (three) times daily as needed for muscle spasms.    . fenofibrate 160 MG tablet Take 160 mg by mouth daily.    . furosemide (LASIX) 40 MG tablet Take 1 tablet (40 mg total) by mouth daily. 30 tablet 2  . glucose blood test strip Use once daily to check blood sugar.  DX E11.9 100 each 3  . lamoTRIgine (LAMICTAL) 200 MG tablet Take 300 mg by mouth daily. Pt taking 1 and 1/2 tablet daily    . metoprolol succinate (TOPROL-XL) 25 MG 24 hr tablet TAKE ONE TABLET BY MOUTH EVERY MORNING  30 tablet 2  . ONE TOUCH LANCETS MISC Test once daily to check blood sugar. DX E11.9 100 each 3  . pantoprazole (PROTONIX) 40 MG tablet TAKE ONE TABLET BY MOUTH DAILY 30 tablet 4  . Polyethyl Glycol-Propyl Glycol (SYSTANE OP) Place 1 drop into both eyes at bedtime as needed (For dry eyes.).     Marland Kitchen Probiotic Product (ALIGN) 4 MG CAPS Take 4 mg by mouth daily.    . rosuvastatin (CRESTOR) 40 MG tablet TAKE 1 TABLET (40 MG TOTAL) BY MOUTH DAILY. 30 tablet 5  . SEROQUEL XR 400 MG 24 hr tablet Take 400 mg by mouth at bedtime.   1  . SUMAtriptan (IMITREX) 50 MG tablet Take 1 tablet (50 mg total) by mouth every 2 (two) hours as needed for migraine (max 2 tabs in 24 hours). May repeat in 2 hours if headache persists or recurs. 10 tablet 1  . venlafaxine XR (EFFEXOR-XR) 150 MG 24 hr capsule Take 150 mg by mouth every morning.    . warfarin (COUMADIN) 5 MG tablet TAKE AS DIRECED BY [facility] 30 tablet 3  . colchicine 0.6 MG tablet Take 1 tablet (0.6 mg total) by mouth daily. (Patient not taking: Reported on 09/04/2016) 30 tablet 0  . Estradiol 10 MCG TABS vaginal tablet One tablet in vagina daily for 2 weeks then 3 times a week for total of 8 weeks (Patient not taking: Reported on 01/21/2016) 30 tablet 2  . oxyCODONE (OXY IR/ROXICODONE) 5 MG immediate release tablet Take 1 tablet (5 mg  total) by mouth every 4 (four) hours as needed for moderate pain. 30 tablet 0  . promethazine (PHENERGAN) 25 MG tablet Take 1 tablet (25 mg total) by mouth every 8 (eight) hours as needed for nausea or vomiting. 20 tablet 1   No facility-administered medications prior to visit.     Allergies  Allergen Reactions  . Mucinex [Guaifenesin Er] Other (See Comments)    Severe Headaches  . Keflex [Cephalexin] Other (See Comments)    Headaches,Dizziness  . Sulfonamide Derivatives Other (See Comments)    headaches    Review of Systems  Constitutional: Positive for malaise/fatigue. Negative for chills, diaphoresis, fatigue and fever.  HENT: Negative for congestion and sore throat.   Eyes: Negative for blurred vision.  Respiratory: Negative for cough and shortness of breath.   Cardiovascular: Negative for chest pain, palpitations and leg swelling.  Gastrointestinal: Negative for abdominal pain, anorexia, change in bowel habit, nausea and vomiting.  Musculoskeletal: Positive for joint pain and myalgias. Negative for arthralgias, back pain, joint swelling and neck pain.  Skin: Negative for rash.  Neurological: Positive for weakness and numbness. Negative for vertigo, loss of consciousness and headaches.  Psychiatric/Behavioral: Positive for depression. Negative for suicidal ideas. The patient is nervous/anxious.        Objective:    Physical Exam  Constitutional: She is oriented to person, place, and time. She appears well-developed and well-nourished. No distress.  HENT:  Head: Normocephalic and atraumatic.  Nose: Nose normal.  Eyes: Right eye exhibits no discharge. Left eye exhibits no discharge.  Neck: Normal range of motion. Neck supple.  Cardiovascular: Normal rate and regular rhythm.   No murmur heard. Pulmonary/Chest: Effort normal and breath sounds normal.  Abdominal: Soft. Bowel sounds are normal. There is no tenderness.  Musculoskeletal: She exhibits tenderness. She exhibits no  edema or deformity.  Pain with palpation over proximal 3 rd metatarsal, no redness or warmth.   Neurological: She is alert  and oriented to person, place, and time.  Skin: Skin is warm and dry.  Psychiatric: She has a normal mood and affect.  Nursing note and vitals reviewed.   BP 126/76 (BP Location: Left Arm, Cuff Size: Large)   Pulse 74   Temp 98.4 F (36.9 C) (Oral)   Resp 16   SpO2 95%  Wt Readings from Last 3 Encounters:  06/05/16 254 lb 6.4 oz (115.4 kg)  01/21/16 234 lb 6.4 oz (106.3 kg)  01/18/16 237 lb (107.5 kg)   BP Readings from Last 3 Encounters:  09/04/16 126/76  06/05/16 132/76  01/21/16 140/80     Immunization History  Administered Date(s) Administered  . Influenza Split 02/13/2011, 02/20/2012  . Influenza Whole 03/11/2010  . Influenza, High Dose Seasonal PF 06/05/2016  . Influenza,inj,Quad PF,36+ Mos 02/03/2013, 02/24/2014, 04/03/2015  . Pneumococcal Conjugate-13 02/24/2014  . Pneumococcal Polysaccharide-23 02/28/2009, 03/22/2012  . Td 03/11/2010  . Zoster 03/15/2012    Health Maintenance  Topic Date Due  . Hepatitis C Screening  1951/04/10  . FOOT EXAM  11/28/1960  . URINE MICROALBUMIN  11/28/1960  . HIV Screening  11/28/1965  . PAP SMEAR  03/23/2015  . INFLUENZA VACCINE  12/24/2016  . OPHTHALMOLOGY EXAM  01/28/2017  . HEMOGLOBIN A1C  03/06/2017  . PNA vac Low Risk Adult (2 of 2 - PPSV23) 03/22/2017  . MAMMOGRAM  04/02/2017  . TETANUS/TDAP  03/11/2020  . COLONOSCOPY  10/31/2025  . DEXA SCAN  Completed    Lab Results  Component Value Date   WBC 6.6 09/04/2016   HGB 13.6 09/04/2016   HCT 40.7 09/04/2016   PLT 216.0 09/04/2016   GLUCOSE 148 (H) 09/04/2016   CHOL 166 09/04/2016   TRIG (H) 09/04/2016    402.0 Triglyceride is over 400; calculations on Lipids are invalid.   HDL 49.30 09/04/2016   LDLDIRECT 68.0 09/04/2016   LDLCALC 71 04/04/2013   ALT 27 09/04/2016   AST 25 09/04/2016   NA 140 09/04/2016   K 4.5 09/04/2016   CL 106  09/04/2016   CREATININE 1.03 09/04/2016   BUN 18 09/04/2016   CO2 26 09/04/2016   TSH 3.36 09/04/2016   INR 2.6 09/01/2016   HGBA1C 7.0 (H) 09/04/2016    Lab Results  Component Value Date   TSH 3.36 09/04/2016   Lab Results  Component Value Date   WBC 6.6 09/04/2016   HGB 13.6 09/04/2016   HCT 40.7 09/04/2016   MCV 89.9 09/04/2016   PLT 216.0 09/04/2016   Lab Results  Component Value Date   NA 140 09/04/2016   K 4.5 09/04/2016   CO2 26 09/04/2016   GLUCOSE 148 (H) 09/04/2016   BUN 18 09/04/2016   CREATININE 1.03 09/04/2016   BILITOT 0.4 09/04/2016   ALKPHOS 67 09/04/2016   AST 25 09/04/2016   ALT 27 09/04/2016   PROT 7.3 09/04/2016   ALBUMIN 4.5 09/04/2016   CALCIUM 9.4 09/04/2016   ANIONGAP 7 11/30/2015   GFR 57.02 (L) 09/04/2016   Lab Results  Component Value Date   CHOL 166 09/04/2016   Lab Results  Component Value Date   HDL 49.30 09/04/2016   Lab Results  Component Value Date   LDLCALC 71 04/04/2013   Lab Results  Component Value Date   TRIG (H) 09/04/2016    402.0 Triglyceride is over 400; calculations on Lipids are invalid.   Lab Results  Component Value Date   CHOLHDL 3 09/04/2016   Lab Results  Component  Value Date   HGBA1C 7.0 (H) 09/04/2016         Assessment & Plan:   Problem List Items Addressed This Visit    MIXED HYPERLIPIDEMIA    Tolerating statin, encouraged heart healthy diet, avoid trans fats, minimize simple carbs and saturated fats. Increase exercise as tolerated, add Welchol due to persistently elevated triglycerides.       Relevant Orders   Lipid panel (Completed)   Bipolar disorder ([place])    Very sad today following with new psychiatry group, [facility] seeing Thayer Headings NP, meds recently adjusted, no changes today will follow with psych as needed      Hyperthyroidism - Primary   Renal insufficiency   Relevant Orders   Comprehensive metabolic panel (Completed)   Atrial fibrillation ([place])    Rate  controlled and tolerating coumadin      Hyperlipidemia associated with type 2 diabetes mellitus ([place])    Sugar worsening, minimize simple carbs and continue to monitor. Add Welchol if patient agrees      Gout    With right foot pain, uric acid elevated and xray c/w possible joint destruction. Will increase Uloric and refer to ortho due to joint destruction for further consideration      Relevant Orders   Uric acid (Completed)   DG Foot Complete Right (Completed)   CBC with Differential/Platelet (Completed)      I have discontinued Ms. Delmont's Estradiol, colchicine, oxyCODONE, promethazine, and QUEtiapine. I am also having her start on traMADol and febuxostat. Additionally, I am having her maintain her clonazePAM, glucose blood, ONE TOUCH LANCETS, SEROQUEL XR, aspirin EC, cyclobenzaprine, fenofibrate, ALIGN, Polyethyl Glycol-Propyl Glycol (SYSTANE OP), venlafaxine XR, acetaminophen, rosuvastatin, warfarin, pantoprazole, SUMAtriptan, furosemide, metoprolol succinate, lamoTRIgine, and busPIRone.  Meds ordered this encounter  Medications  . busPIRone (BUSPAR) 15 MG tablet  . DISCONTD: QUEtiapine (SEROQUEL XR) 300 MG 24 hr tablet  . traMADol (ULTRAM) 50 MG tablet    Sig: Take 1 tablet (50 mg total) by mouth every 8 (eight) hours as needed. Severe pain    Dispense:  30 tablet    Refill:  0  . febuxostat (ULORIC) 40 MG tablet    Sig: Take 1 tablet (40 mg total) by mouth daily.    Dispense:  30 tablet    Refill:  2    CMA served as scribe during this visit. History, Physical and Plan performed by medical provider. Documentation and orders reviewed and attested to.  Penni Homans, MD

## 2016-09-04 NOTE — Telephone Encounter (Signed)
PA initiated via Covermymeds; KEY: [id number]. Awaiting determination.

## 2016-09-04 NOTE — Progress Notes (Signed)
Pre visit review using our clinic review tool, if applicable. No additional management support is needed unless otherwise documented below in the visit note. 

## 2016-09-05 LAB — CBC WITH DIFFERENTIAL/PLATELET
Basophils Absolute: 0.1 10*3/uL (ref 0.0–0.1)
Basophils Relative: 1.5 % (ref 0.0–3.0)
Eosinophils Absolute: 0.2 10*3/uL (ref 0.0–0.7)
Eosinophils Relative: 2.6 % (ref 0.0–5.0)
HCT: 40.7 % (ref 36.0–46.0)
Hemoglobin: 13.6 g/dL (ref 12.0–15.0)
Lymphocytes Relative: 31.6 % (ref 12.0–46.0)
Lymphs Abs: 2.1 10*3/uL (ref 0.7–4.0)
MCHC: 33.5 g/dL (ref 30.0–36.0)
MCV: 89.9 fl (ref 78.0–100.0)
Monocytes Absolute: 0.6 10*3/uL (ref 0.1–1.0)
Monocytes Relative: 9.1 % (ref 3.0–12.0)
Neutro Abs: 3.7 10*3/uL (ref 1.4–7.7)
Neutrophils Relative %: 55.2 % (ref 43.0–77.0)
Platelets: 216 10*3/uL (ref 150.0–400.0)
RBC: 4.52 Mil/uL (ref 3.87–5.11)
RDW: 14.2 % (ref 11.5–15.5)
WBC: 6.6 10*3/uL (ref 4.0–10.5)

## 2016-09-05 LAB — COMPREHENSIVE METABOLIC PANEL
ALT: 27 U/L (ref 0–35)
AST: 25 U/L (ref 0–37)
Albumin: 4.5 g/dL (ref 3.5–5.2)
Alkaline Phosphatase: 67 U/L (ref 39–117)
BUN: 18 mg/dL (ref 6–23)
CO2: 26 mEq/L (ref 19–32)
Calcium: 9.4 mg/dL (ref 8.4–10.5)
Chloride: 106 mEq/L (ref 96–112)
Creatinine, Ser: 1.03 mg/dL (ref 0.40–1.20)
GFR: 57.02 mL/min — ABNORMAL LOW (ref >60.00)
Glucose, Bld: 148 mg/dL — ABNORMAL HIGH (ref 70–99)
Potassium: 4.5 mEq/L (ref 3.5–5.1)
Sodium: 140 mEq/L (ref 135–145)
Total Bilirubin: 0.4 mg/dL (ref 0.2–1.2)
Total Protein: 7.3 g/dL (ref 6.0–8.3)

## 2016-09-05 LAB — HEMOGLOBIN A1C: Hgb A1c MFr Bld: 7 % — ABNORMAL HIGH (ref 4.6–6.5)

## 2016-09-05 LAB — URIC ACID: Uric Acid, Serum: 8.6 mg/dL — ABNORMAL HIGH (ref 2.4–7.0)

## 2016-09-05 LAB — LIPID PANEL
Cholesterol: 166 mg/dL (ref 0–200)
HDL: 49.3 mg/dL (ref >39.00)
Total CHOL/HDL Ratio: 3
Triglycerides: 402 mg/dL — ABNORMAL HIGH (ref 0.0–149.0)

## 2016-09-05 LAB — TSH: TSH: 3.36 u[IU]/mL (ref 0.35–4.50)

## 2016-09-05 LAB — LDL CHOLESTEROL, DIRECT: Direct LDL: 68 mg/dL

## 2016-09-05 NOTE — Telephone Encounter (Signed)
PA Case: [id number], Status: Approved, Coverage Starts on: 09/05/2016 12:00:00 AM, Coverage Ends on: 05/25/2017 12:00:00 AM

## 2016-09-07 ENCOUNTER — Encounter: Payer: Self-pay | Admitting: Family Medicine

## 2016-09-07 NOTE — Assessment & Plan Note (Signed)
Tolerating statin, encouraged heart healthy diet, avoid trans fats, minimize simple carbs and saturated fats. Increase exercise as tolerated, add Welchol due to persistently elevated triglycerides.

## 2016-09-07 NOTE — Assessment & Plan Note (Signed)
Rate controlled and tolerating coumadin 

## 2016-09-07 NOTE — Assessment & Plan Note (Signed)
Sugar worsening, minimize simple carbs and continue to monitor. Add Welchol if patient agrees

## 2016-09-07 NOTE — Assessment & Plan Note (Signed)
Very sad today following with new psychiatry group, [facility] seeing Thayer Headings NP, meds recently adjusted, no changes today will follow with psych as needed

## 2016-09-07 NOTE — Assessment & Plan Note (Signed)
With right foot pain, uric acid elevated and xray c/w possible joint destruction. Will increase Uloric and refer to ortho due to joint destruction for further consideration

## 2016-09-08 ENCOUNTER — Other Ambulatory Visit: Payer: Self-pay | Admitting: Family Medicine

## 2016-09-08 MED ORDER — COLESEVELAM HCL 625 MG PO TABS: 1875.0000 mg | ORAL_TABLET | Freq: Two times a day (BID) | ORAL | 3 refills | Status: DC

## 2016-09-08 MED ORDER — FEBUXOSTAT 40 MG PO TABS: 40.0000 mg | ORAL_TABLET | Freq: Every day | ORAL | 2 refills | Status: DC

## 2016-09-08 MED FILL — ULORIC 40 MG TABLET: 40 | 30 days supply | Qty: 30 | Fill #0

## 2016-09-08 MED FILL — WELCHOL 625 MG TABLET: 625 | 30 days supply | Qty: 180 | Fill #0 | Status: TO

## 2016-09-08 NOTE — Telephone Encounter (Signed)
Patient in today with her husband for his visit and wondering what we called her about this am. She reports she was not taking the Uloric at the time of her recent lab work so for now will have her start the Uloric 40 mg dose and will not increase 80 mg for now. Also she agrees to start Welchol 625 mg tabs 3 tab po bid, prescriptions sent to [facility]

## 2016-09-09 ENCOUNTER — Other Ambulatory Visit: Payer: Self-pay | Admitting: Family Medicine

## 2016-09-10 ENCOUNTER — Telehealth: Payer: Self-pay | Admitting: Pharmacist

## 2016-09-10 NOTE — Telephone Encounter (Signed)
Pt called to report that she started [employer]. Advised to keep coumadin appt as scheduled. Pt stated understanding and appreciation.

## 2016-09-14 NOTE — Progress Notes (Signed)
Cardiology Office Note    Date:  09/15/2016   ID:  [name], DOB August 08, 1950, MRN [id number]  PCP:  Penni Homans, MD  Cardiologist: Sinclair Grooms, MD   Chief Complaint  Patient presents with  . Cardiac Valve Problem    Aortic valve  . Follow-up    Elevated blood pressure    History of Present Illness:  [name] is a 66 y.o. female who presents for aortic valve replacement using mechanical for bicuspid valve 1607, diastolic left ventricular heart failure, significant history of depression/anxiety/bipolar disorder, lower extremity edema and hand swelling  Status post aortic valve replacement for bicuspid aortic valve and 2011. No cardiac complaints. Having a terrible time with the motions/psychiatric illness. No episodes of syncope. No bleeding complications. Some discord between the patient and his wife.  Past Medical History:  Diagnosis Date  . ANEMIA 05/28/2010  . AORTIC VALVE REPLACEMENT, HX OF 03/11/2010   Mechanical prosthesis  . BIPOLAR AFFECTIVE DISORDER 03/11/2010  . Depression   . Diverticulitis 12/18/2010  . FIBROIDS, UTERUS 03/11/2010  . Gout 09/04/2016  . Grave's disease 8-12  . Hyperlipidemia associated with type 2 diabetes mellitus ([place]) 06/05/2016  . Hypertension   . Migraine 06/05/2016  . Mixed hyperlipidemia 03/11/2010  . Obesity   . UTI (lower urinary tract infection) 08/14/2011    Past Surgical History:  Procedure Laterality Date  . ABDOMINAL HYSTERECTOMY  ?1998   sb/l spo, total for fibroids/heavy bleeding  . AORTIC VALVE REPLACEMENT  2011   Mechanical prosthesis, [employer]  . APPENDECTOMY     Required revision for EColi infection, required recurrent packing  . bowel obstruction     Requiring adhesions to be removed  . CHOLECYSTECTOMY    . COLONOSCOPY WITH PROPOFOL N/A 11/01/2015   Procedure: COLONOSCOPY WITH PROPOFOL;  Surgeon: Milus Banister, MD;  Location: [facility];  Service: Endoscopy;  Laterality: N/A;  . CYSTOCELE REPAIR N/A  10/16/2015   Procedure: ANTERIOR REPAIR (CYSTOCELE);  Surgeon: Donnamae Jude, MD;  Location: [facility];  Service: Gynecology;  Laterality: N/A;  . HERNIA REPAIR  08-16-10  . LEFT HEART CATHETERIZATION WITH CORONARY ANGIOGRAM N/A 12/05/2011   Procedure: LEFT HEART CATHETERIZATION WITH CORONARY ANGIOGRAM;  Surgeon: Sinclair Grooms, MD;  Location: [facility];  Service: Cardiovascular;  Laterality: N/A;  . TOTAL KNEE ARTHROPLASTY Right 02/24/2013   Procedure: RIGHT TOTAL KNEE ARTHROPLASTY;  Surgeon: Johnn Hai, MD;  Location: [facility];  Service: Orthopedics;  Laterality: Right;  . TUBAL LIGATION    . varicose vein surgery b/l legs      Current Medications: Outpatient Medications Prior to Visit  Medication Sig Dispense Refill  . acetaminophen (TYLENOL) 500 MG tablet Take 1,000-1,500 mg by mouth every 6 (six) hours as needed (For knee pain.).    Marland Kitchen aspirin EC 81 MG tablet Take 81 mg by mouth daily.    . busPIRone (BUSPAR) 15 MG tablet     . clonazePAM (KLONOPIN) 0.5 MG tablet Take 0.5 mg by mouth 2 (two) times daily.     . colesevelam (WELCHOL) 625 MG tablet Take 3 tablets (1,875 mg total) by mouth 2 (two) times daily with a meal. 180 tablet 3  . cyclobenzaprine (FLEXERIL) 10 MG tablet Take 10 mg by mouth 3 (three) times daily as needed for muscle spasms.    . Febuxostat (ULORIC) 80 MG TABS Take 1 tablet by mouth daily.    . fenofibrate 160 MG tablet Take 160 mg  by mouth daily.    . furosemide (LASIX) 40 MG tablet Take 1 tablet (40 mg total) by mouth daily. 30 tablet 2  . glucose blood test strip Use once daily to check blood sugar.  DX E11.9 100 each 3  . lamoTRIgine (LAMICTAL) 200 MG tablet Take 300 mg by mouth daily. Pt taking 1 and 1/2 tablet daily    . metoprolol succinate (TOPROL-XL) 25 MG 24 hr tablet TAKE ONE TABLET BY MOUTH EVERY MORNING 30 tablet 2  . ONE TOUCH LANCETS MISC Test once daily to check blood sugar. DX E11.9 100 each 3  . pantoprazole (PROTONIX) 40 MG tablet TAKE ONE TABLET  BY MOUTH DAILY 30 tablet 4  . Polyethyl Glycol-Propyl Glycol (SYSTANE OP) Place 1 drop into both eyes at bedtime as needed (For dry eyes.).     Marland Kitchen Probiotic Product (ALIGN) 4 MG CAPS Take 4 mg by mouth daily.    . rosuvastatin (CRESTOR) 40 MG tablet TAKE 1 TABLET (40 MG TOTAL) BY MOUTH DAILY. 30 tablet 5  . SEROQUEL XR 400 MG 24 hr tablet Take 400 mg by mouth at bedtime.   1  . SUMAtriptan (IMITREX) 50 MG tablet Take 1 tablet (50 mg total) by mouth every 2 (two) hours as needed for migraine (max 2 tabs in 24 hours). May repeat in 2 hours if headache persists or recurs. 10 tablet 1  . traMADol (ULTRAM) 50 MG tablet Take 1 tablet (50 mg total) by mouth every 8 (eight) hours as needed. Severe pain 30 tablet 0  . venlafaxine XR (EFFEXOR-XR) 150 MG 24 hr capsule Take 150 mg by mouth every morning.    . warfarin (COUMADIN) 5 MG tablet TAKE AS DIRECED BY [facility] 30 tablet 3   No facility-administered medications prior to visit.      Allergies:   Mucinex [guaifenesin er]; Keflex [cephalexin]; and Sulfonamide derivatives   Social History   Social History  . Marital status: Married    Spouse name: N/A  . Number of children: N/A  . Years of education: N/A   Social History Main Topics  . Smoking status: Former Smoker    Quit date: 05/26/1990  . Smokeless tobacco: Never Used  . Alcohol use 0.0 oz/week     Comment: rare  . Drug use: No  . Sexual activity: No   Other Topics Concern  . None   Social History Narrative  . None     Family History:  The patient's family history includes Alcohol abuse in her brother, brother, father, and maternal grandfather; Aneurysm in her mother; Arthritis in her mother; Cancer in her maternal grandmother; Depression in her brother, brother, paternal grandfather, paternal grandmother, sister, and sister; Diabetes in her maternal grandfather, paternal grandfather, and paternal grandmother; Heart disease in her brother and maternal grandfather; Scoliosis in  her mother.   ROS:   Please see the history of present illness.    Occasional leg swelling and shortness of breath. Depression and anxiety or her major concerns and clinical issues. Back pain and muscle pain. Occasional joint swelling and difficulty with balance. Denies snoring. She does have occasional skipped beats. There is excessive fatigue.  All other systems reviewed and are negative.   PHYSICAL EXAM:   VS:  BP (!) 154/78 (BP Location: Left Arm)   Pulse 68   Ht 5\' 8"  (1.727 m)   Wt 255 lb (115.7 kg)   BMI 38.77 kg/m    GEN: Well nourished, well developed, in no acute  distress  HEENT: normal  Neck: no JVD, carotid bruits, or masses Cardiac: RRR; 2/6 systolic murmur, without rub, or gallops. No edema  Respiratory:  clear to auscultation bilaterally, normal work of breathing GI: soft, nontender, nondistended, + BS MS: no deformity or atrophy  Skin: warm and dry, no rash Neuro:  Alert and Oriented x 3, Strength and sensation are intact Psych: euthymic mood, full affect  Wt Readings from Last 3 Encounters:  09/15/16 255 lb (115.7 kg)  06/05/16 254 lb 6.4 oz (115.4 kg)  01/21/16 234 lb 6.4 oz (106.3 kg)      Studies/Labs Reviewed:   EKG:  EKG  Sinus rhythm with first-degree AV block. Nonspecific T wave flattening. PVCs with fixed coupling interval.  Recent Labs: 09/04/2016: ALT 27; BUN 18; Creatinine, Ser 1.03; Hemoglobin 13.6; Platelets 216.0; Potassium 4.5; Sodium 140; TSH 3.36   Lipid Panel    Component Value Date/Time   CHOL 166 09/04/2016 1648   TRIG (H) 09/04/2016 1648    402.0 Triglyceride is over 400; calculations on Lipids are invalid.   HDL 49.30 09/04/2016 1648   CHOLHDL 3 09/04/2016 1648   VLDL 50.6 (H) 02/24/2014 1037   LDLCALC 71 04/04/2013 1622   LDLDIRECT 68.0 09/04/2016 1648    Additional studies/ records that were reviewed today include:  No new data.    ASSESSMENT:    1. Atrial fibrillation, unspecified type ([place])   2. Essential  hypertension, benign   3. Hx of mechanical aortic valve replacement   4. Long term current use of anticoagulant therapy   5. Mixed hyperlipidemia   6. Encounter for therapeutic drug monitoring      PLAN:  In order of problems listed above:  1. No recent recurrences. 2. The blood pressure control is inadequate with consistent measurements this morning in the 537-4 60 mmHg systolic range. Add amlodipine 5 mg per day. Her blood pressure less than 140 mmHg. 3. Crisp valve sounds without auscultation of a regurgitation murmur suggestive valve function is normal. 4. No bleeding complications on the current medical regimen. She is on warfarin to prevent valve thrombosis.  Blood pressure control is suspect. Amlodipine 5 mg per day is been added. She will monitor pressure at home.  Medication Adjustments/Labs and Tests Ordered: Current medicines are reviewed at length with the patient today.  Concerns regarding medicines are outlined above.  Medication changes, Labs and Tests ordered today are listed in the Patient Instructions below. Patient Instructions  Medication Instructions:  1) START Amlodipine 5mg  once daily  Labwork: None  Testing/Procedures: None  Follow-Up: Your physician wants you to follow-up in: 1 year with Dr. Tamala Julian.  You will receive a reminder letter in the mail two months in advance. If you don't receive a letter, please call our office to schedule the follow-up appointment.   Any Other Special Instructions Will Be Listed Below (If Applicable).     If you need a refill on your cardiac medications before your next appointment, please call your pharmacy.      Signed, Sinclair Grooms, MD  09/15/2016 12:47 PM    [facility] [address], [place], [place]  [address] Phone: [phone]; Fax: [phone]

## 2016-09-15 ENCOUNTER — Ambulatory Visit (INDEPENDENT_AMBULATORY_CARE_PROVIDER_SITE_OTHER): Payer: PPO | Admitting: Interventional Cardiology

## 2016-09-15 ENCOUNTER — Ambulatory Visit (INDEPENDENT_AMBULATORY_CARE_PROVIDER_SITE_OTHER): Payer: PPO | Admitting: *Deleted

## 2016-09-15 ENCOUNTER — Encounter: Payer: Self-pay | Admitting: Interventional Cardiology

## 2016-09-15 VITALS — BP 154/78 | HR 68 | Ht 68.0 in | Wt 255.0 lb

## 2016-09-15 DIAGNOSIS — Z5181 Encounter for therapeutic drug level monitoring: Secondary | ICD-10-CM

## 2016-09-15 DIAGNOSIS — E782 Mixed hyperlipidemia: Secondary | ICD-10-CM

## 2016-09-15 DIAGNOSIS — I4891 Unspecified atrial fibrillation: Secondary | ICD-10-CM | POA: Diagnosis not present

## 2016-09-15 DIAGNOSIS — I10 Essential (primary) hypertension: Secondary | ICD-10-CM

## 2016-09-15 DIAGNOSIS — Z952 Presence of prosthetic heart valve: Secondary | ICD-10-CM | POA: Diagnosis not present

## 2016-09-15 DIAGNOSIS — Z7901 Long term (current) use of anticoagulants: Secondary | ICD-10-CM

## 2016-09-15 LAB — POCT INR: INR: 3.5

## 2016-09-15 MED ORDER — AMLODIPINE BESYLATE 5 MG PO TABS: 5.0000 mg | ORAL_TABLET | Freq: Every day | ORAL | 3 refills | Status: DC

## 2016-09-15 NOTE — Patient Instructions (Signed)
Medication Instructions:  1) START Amlodipine 5mg  once daily  Labwork: None  Testing/Procedures: None  Follow-Up: Your physician wants you to follow-up in: 1 year with Dr. Tamala Julian.  You will receive a reminder letter in the mail two months in advance. If you don't receive a letter, please call our office to schedule the follow-up appointment.   Any Other Special Instructions Will Be Listed Below (If Applicable).     If you need a refill on your cardiac medications before your next appointment, please call your pharmacy.

## 2016-09-29 ENCOUNTER — Ambulatory Visit: Payer: PPO | Admitting: Family Medicine

## 2016-10-07 ENCOUNTER — Other Ambulatory Visit: Payer: Self-pay | Admitting: Interventional Cardiology

## 2016-10-07 ENCOUNTER — Other Ambulatory Visit: Payer: Self-pay | Admitting: Family Medicine

## 2016-10-13 ENCOUNTER — Telehealth: Payer: Self-pay | Admitting: Family Medicine

## 2016-10-13 NOTE — Telephone Encounter (Signed)
Requesting:   tramadol Contract    none UDS   none Last OV     09/04/2016-----future appt  Is on 11/21/2016 Last Refill    #30 no refills on 09/04/2016  Please Advise

## 2016-10-13 NOTE — Telephone Encounter (Signed)
Caller name: Relationship to patient: Self Can be reached: [phone] Pharmacy:  [facility] [address] [place] [phone] (Phone) [phone] (Fax)     Reason for call: Refill traMADol (ULTRAM) 50 MG tablet [[id number]]

## 2016-10-13 NOTE — Telephone Encounter (Signed)
OK to refill but needs UDS and contract 

## 2016-10-14 ENCOUNTER — Ambulatory Visit (INDEPENDENT_AMBULATORY_CARE_PROVIDER_SITE_OTHER): Payer: PPO | Admitting: *Deleted

## 2016-10-14 ENCOUNTER — Encounter: Payer: Self-pay | Admitting: Family Medicine

## 2016-10-14 ENCOUNTER — Encounter (INDEPENDENT_AMBULATORY_CARE_PROVIDER_SITE_OTHER): Payer: Self-pay

## 2016-10-14 DIAGNOSIS — I4891 Unspecified atrial fibrillation: Secondary | ICD-10-CM

## 2016-10-14 DIAGNOSIS — Z5181 Encounter for therapeutic drug level monitoring: Secondary | ICD-10-CM | POA: Diagnosis not present

## 2016-10-14 DIAGNOSIS — Z952 Presence of prosthetic heart valve: Secondary | ICD-10-CM

## 2016-10-14 DIAGNOSIS — Z79891 Long term (current) use of opiate analgesic: Secondary | ICD-10-CM | POA: Diagnosis not present

## 2016-10-14 LAB — POCT INR: INR: 3

## 2016-10-14 MED ORDER — TRAMADOL HCL 50 MG PO TABS: 50.0000 mg | ORAL_TABLET | Freq: Three times a day (TID) | ORAL | 0 refills | Status: DC | PRN

## 2016-10-14 NOTE — Telephone Encounter (Signed)
rx and contract printed called pt and left vmail to call the office back.   She will need to pick up rx, sign her contract and leave a UDS.   PC

## 2016-10-21 ENCOUNTER — Other Ambulatory Visit: Payer: Self-pay | Admitting: Emergency Medicine

## 2016-11-04 ENCOUNTER — Other Ambulatory Visit: Payer: Self-pay | Admitting: Interventional Cardiology

## 2016-11-11 DIAGNOSIS — F39 Unspecified mood [affective] disorder: Secondary | ICD-10-CM | POA: Diagnosis not present

## 2016-11-21 ENCOUNTER — Ambulatory Visit (INDEPENDENT_AMBULATORY_CARE_PROVIDER_SITE_OTHER): Payer: PPO | Admitting: Family Medicine

## 2016-11-21 ENCOUNTER — Ambulatory Visit (INDEPENDENT_AMBULATORY_CARE_PROVIDER_SITE_OTHER): Payer: PPO

## 2016-11-21 DIAGNOSIS — Z5181 Encounter for therapeutic drug level monitoring: Secondary | ICD-10-CM

## 2016-11-21 DIAGNOSIS — Z952 Presence of prosthetic heart valve: Secondary | ICD-10-CM

## 2016-11-21 DIAGNOSIS — I4891 Unspecified atrial fibrillation: Secondary | ICD-10-CM | POA: Diagnosis not present

## 2016-11-21 DIAGNOSIS — I10 Essential (primary) hypertension: Secondary | ICD-10-CM

## 2016-11-21 LAB — POCT INR: INR: 2

## 2016-11-23 NOTE — Progress Notes (Signed)
Patient unable to stay for appointment after showing up very early.

## 2016-11-25 ENCOUNTER — Ambulatory Visit: Payer: PPO | Admitting: Medical

## 2016-11-25 ENCOUNTER — Telehealth: Payer: Self-pay | Admitting: Family Medicine

## 2016-11-25 NOTE — Telephone Encounter (Signed)
Patient cancelled 11am appointment today with Percell Miller due to spouse Lainie Daubert being hospitalized,charge or no charge    Mrs Gene was crying stating she's worried because they have her spouse on sucide watch because he's ready to pass, informed patient we are here for her and if theres anything we can do please let us know, Mrs Borgeson was grateful and I advised her I would make Dr. Charlett Blake aware.

## 2016-11-25 NOTE — Telephone Encounter (Signed)
No charge. 

## 2016-11-27 ENCOUNTER — Other Ambulatory Visit: Payer: Self-pay | Admitting: Family Medicine

## 2016-11-27 MED ORDER — TIZANIDINE HCL 2 MG PO TABS: 2.0000 mg | ORAL_TABLET | Freq: Three times a day (TID) | ORAL | 2 refills | Status: DC | PRN

## 2016-11-27 NOTE — Telephone Encounter (Signed)
She was in with her husband today and we agreed we would not refill this for now, I gave her some Tizanidine instead for now.

## 2016-11-27 NOTE — Progress Notes (Signed)
Patient in for her husband's hospital follow up and they are heading up to [place] for a month to see family secondary to all of there health concerns. She is having significant back pain worse in evening while preparing dinner. She is using tylenol intermittently. Will add Tylenol 500 mg tabs, 2 tab by mouth twice daily and continue pain patches. Then will add Tizanidine 2 mg po tid prn pain. Will see her when she returns in one month then we will refer for physical therapy

## 2016-11-27 NOTE — Telephone Encounter (Signed)
Requesting:   tramadol Contract   10/14/16 UDS  Low risk---next is due on 10/14/16 Last OV   09/04/16 Last Refill    #30 no refill son 10/14/16  Please Advise

## 2016-11-28 NOTE — Telephone Encounter (Signed)
I gave her Tizanidine to try because she has Clonazepam and she should not mix that with the Tramadol. No no refill at this time.

## 2016-12-10 ENCOUNTER — Ambulatory Visit (INDEPENDENT_AMBULATORY_CARE_PROVIDER_SITE_OTHER): Payer: PPO | Admitting: *Deleted

## 2016-12-10 DIAGNOSIS — I4891 Unspecified atrial fibrillation: Secondary | ICD-10-CM

## 2016-12-10 DIAGNOSIS — Z5181 Encounter for therapeutic drug level monitoring: Secondary | ICD-10-CM | POA: Diagnosis not present

## 2016-12-10 DIAGNOSIS — Z952 Presence of prosthetic heart valve: Secondary | ICD-10-CM | POA: Diagnosis not present

## 2016-12-10 LAB — POCT INR: INR: 2.2

## 2016-12-19 ENCOUNTER — Other Ambulatory Visit: Payer: Self-pay | Admitting: Family Medicine

## 2016-12-19 DIAGNOSIS — M549 Dorsalgia, unspecified: Secondary | ICD-10-CM

## 2016-12-30 ENCOUNTER — Ambulatory Visit: Payer: PPO | Attending: Family Medicine | Admitting: Physical Therapy

## 2016-12-30 DIAGNOSIS — M545 Low back pain, unspecified: Secondary | ICD-10-CM

## 2016-12-30 DIAGNOSIS — G8929 Other chronic pain: Secondary | ICD-10-CM | POA: Diagnosis not present

## 2016-12-30 DIAGNOSIS — M6281 Muscle weakness (generalized): Secondary | ICD-10-CM | POA: Insufficient documentation

## 2016-12-30 DIAGNOSIS — R293 Abnormal posture: Secondary | ICD-10-CM | POA: Diagnosis not present

## 2016-12-30 NOTE — Therapy (Signed)
[facility] [address], [place], [place], [address] Phone: [phone]   Fax:  [phone]  Physical Therapy Evaluation  Patient Details  Name: [name] MRN: [id number] Date of Birth: [birth year] Referring Provider: Dr. Penni Homans   Encounter Date: 12/30/2016      PT End of Session - 12/30/16 2052    Visit Number 1   Date for PT Re-Evaluation Mar 20, 2017   Authorization Type G codes; KX at visit 15   PT Start Time 1230   PT Stop Time 1315   PT Time Calculation (min) 45 min   Activity Tolerance Patient tolerated treatment well      Past Medical History:  Diagnosis Date  . ANEMIA 05/28/2010  . AORTIC VALVE REPLACEMENT, HX OF 03/11/2010   Mechanical prosthesis  . BIPOLAR AFFECTIVE DISORDER 03/11/2010  . Depression   . Diverticulitis 12/18/2010  . FIBROIDS, UTERUS 03/11/2010  . Gout 09/04/2016  . Grave's disease 8-12  . Hyperlipidemia associated with type 2 diabetes mellitus ([place]) 06/05/2016  . Hypertension   . Migraine 06/05/2016  . Mixed hyperlipidemia 03/11/2010  . Obesity   . UTI (lower urinary tract infection) 08/14/2011    Past Surgical History:  Procedure Laterality Date  . ABDOMINAL HYSTERECTOMY  ?1998   sb/l spo, total for fibroids/heavy bleeding  . AORTIC VALVE REPLACEMENT  2011   Mechanical prosthesis, [employer]  . APPENDECTOMY     Required revision for EColi infection, required recurrent packing  . bowel obstruction     Requiring adhesions to be removed  . CHOLECYSTECTOMY    . COLONOSCOPY WITH PROPOFOL N/A 11/01/2015   Procedure: COLONOSCOPY WITH PROPOFOL;  Surgeon: Milus Banister, MD;  Location: [facility];  Service: Endoscopy;  Laterality: N/A;  . CYSTOCELE REPAIR N/A 10/16/2015   Procedure: ANTERIOR REPAIR (CYSTOCELE);  Surgeon: Donnamae Jude, MD;  Location: [facility];  Service: Gynecology;  Laterality: N/A;  . HERNIA REPAIR  08-16-10  . LEFT HEART CATHETERIZATION WITH CORONARY ANGIOGRAM N/A  12/05/2011   Procedure: LEFT HEART CATHETERIZATION WITH CORONARY ANGIOGRAM;  Surgeon: Sinclair Grooms, MD;  Location: [facility] CATH LAB;  Service: Cardiovascular;  Laterality: N/A;  . TOTAL KNEE ARTHROPLASTY Right 03-20-13   Procedure: RIGHT TOTAL KNEE ARTHROPLASTY;  Surgeon: Johnn Hai, MD;  Location: [facility];  Service: Orthopedics;  Laterality: Right;  . TUBAL LIGATION    . varicose vein surgery b/l legs      There were no vitals filed for this visit.       Subjective Assessment - 12/30/16 1239    Subjective My back hurts to stand to make a whole meal.  I have to lean over.  Sitting is fine.  Bothering 1 year.    No apparent reason.  Across low back and hips.   Her walking distance is limited to < 50 yards and she states she has to lean over to wash the dishes.  Does pool ex and rides stationary bike currently.     Patient is accompained by: Family member  husband present for first few minutes   Pertinent History mechanical heart valve following aneurysm; open heart surgery;  TKR right;  left ankle multi fracture a few years ago with hardware   Limitations Standing;House hold activities;Walking   How long can you sit comfortably? unlimited   How long can you stand comfortably? 1/2 hour   How long can you walk comfortably? limited b/c of ankle;  < 50 yards  Diagnostic tests Unsure if had x-ray   Patient Stated Goals see if there is a leg length discrepancy; I want my back to feel better;  do I need orthotics   Currently in Pain? Yes   Pain Score 3    Pain Location Back   Pain Orientation Lower;Right;Left   Pain Type Chronic pain   Pain Onset More than a month ago   Pain Frequency Intermittent   Aggravating Factors  standing, walking   Pain Relieving Factors sitting, (pain patches don't really work);  pain pills            [facility] PT Assessment - 12/30/16 0001      Assessment   Medical Diagnosis back pain   Referring Provider Dr. Penni Homans    Onset Date/Surgical Date --   1 year   Hand Dominance Right   Next MD Visit 01/20/17   Prior Therapy after TKR     Precautions   Precautions Fall     Restrictions   Weight Bearing Restrictions No     Balance Screen   Has the patient fallen in the past 6 months No   Has the patient had a decrease in activity level because of a fear of falling?  Yes  my leg gave way in my bedroom   Is the patient reluctant to leave their home because of a fear of falling?  No     Home Environment   Living Environment Private residence   Living Arrangements Spouse/significant other   Type of [place] Access Level entry   [place] - single point;Other (comment)   Additional Comments I don't care for the cane and don't use it often  lift chair     Prior Function   Level of Independence Independent   Vocation Unemployed   Leisure quilter; knit socks; play games on phone     Observation/Other Assessments   Observations in supine symmetrical malleoli, patella and ASIS   Focus on Therapeutic Outcomes (FOTO)  66% limitation     Posture/Postural Control   Posture/Postural Control Postural limitations   Postural Limitations Decreased lumbar lordosis   Posture Comments Stands with left knee bent, decreased weight bearing on left     AROM   Lumbar Flexion WFLs  sitting and supine   Lumbar Extension 10   Lumbar - Right Side Bend 10   Lumbar - Left Side Bend 10     Strength   Right Hip Extension 4-/5   Right Hip ABduction 4-/5   Left Hip Extension 4-/5   Left Hip ABduction 3+/5   Lumbar Flexion 3+/5   Lumbar Extension 3+/5     Flexibility   Soft Tissue Assessment /Muscle Length yes   Hamstrings bilateral  right > left   Quadriceps bil decreased hip flexor lengths     Palpation   Palpation comment minimal tenderness     Ambulation/Gait   Pre-Gait Activities moderate limp with decreased stance time on left   Gait Comments no assistive device used      Balance    Balance Assessed --  Upon rising from chair, patient has loss of balance      Berg Balance Test   Berg comment: assess next visit     Timed Up and Go Test   TUG Comments assess next visit            Objective measurements completed on examination: See above findings.  PT Education - 12/30/16 2052    Education provided Yes   Education Details Supine and seated abdominal brace;  discussion of appropriate type of ex for her problem (seated or low compression (pool) and bike instead of treadmill   Person(s) Educated Patient   Methods Explanation;Demonstration   Comprehension Verbalized understanding;Returned demonstration          PT Short Term Goals - 12/30/16 2111      PT SHORT TERM GOAL #1   Title The patient will demonstrate understanding of initial HEP    Time 4   Period Weeks   Status New   Target Date 01/27/17     PT SHORT TERM GOAL #2   Title The patient will be able to ambulate 250 feet with pain < or equal to 5/10   Time 4   Period Weeks   Status New   Target Date 01/27/17     PT SHORT TERM GOAL #3   Title The patient will be able to stand for 8 min to wash dishes   Time 4   Period Weeks   Status New   Target Date 01/27/17           PT Long Term Goals - 12/30/16 2116      PT LONG TERM GOAL #1   Title The patient will be independent with initial HEP needed for further improvements in strength and function    Time 8   Period Weeks   Status New   Target Date 02/24/17     PT LONG TERM GOAL #2   Title The patient will have bilateral hip strength and lumbo/pelvic strength to grossly 4/5 needed for standing for meal prep 10-15 min   Time 8   Period Weeks   Status New   Target Date 02/24/17     PT LONG TERM GOAL #3   Title The patient will be able to ambulate 400 feet needed for short distance community ambulation with pain level 4/10 or less   Time 8   Period Weeks   Status New   Target Date 02/24/17     PT  LONG TERM GOAL #4   Title FOTO functional outcome score improved to 53% limitation indicating improved function with less pain   Time 8   Period Weeks   Status New   Target Date 02/24/17                Plan - 12/30/16 2054    Clinical Impression Statement The patient has a 1 year history of bilateral low back and buttock pain with standing and walking and relieved with sitting.  Her walking distance is limited to < 50 yards and she states she has to lean over to wash the dishes.  She feels as if she has leg length discrepancy.  In standing she has more weight on the right leg with left knee bent however in supine bony prominences as symmetrical.  Her gluteal and lumbo pelvic muscles weakness may be contributing to a pelvic drop in weight bearing.  She has an episode of loss of balance after rising from sit to stand.  Recommend BERG and Timed up and Go tests next visit.  She would benefit from PT to address strength and functional deficits.     History and Personal Factors relevant to plan of care: multiple co-morbidities including mechanical heart valve following aneurysm; open heart surgery;  TKR right;  left ankle multi fracture a few years ago with hardware,  obesity, bipolar disorder    Clinical Presentation Evolving   Clinical Presentation due to: Low back pain    Clinical Decision Making Moderate   Rehab Potential Good   Clinical Impairments Affecting Rehab Potential see comorbidities above   PT Frequency 2x / week   PT Duration 8 weeks   PT Treatment/Interventions ADLs/Self Care Home Management;Electrical Stimulation;Cryotherapy;Moist Heat;Functional mobility training;Patient/family education;Neuromuscular re-education;Therapeutic exercise;Therapeutic activities;Manual techniques;Taping;Dry needling   PT Next Visit Plan check BERG and TUG;  neutral and flexion biased spinal exercises;  HS stretch;  review abdominal brace and add clam ex in supine or sitting   Consulted and Agree  with Plan of Care Patient      Patient will benefit from skilled therapeutic intervention in order to improve the following deficits and impairments:  Abnormal gait, Decreased activity tolerance, Decreased endurance, Decreased strength, Difficulty walking, Pain, Postural dysfunction  Visit Diagnosis: Chronic bilateral low back pain without sciatica - Plan: PT plan of care cert/re-cert  Muscle weakness (generalized) - Plan: PT plan of care cert/re-cert  Abnormal posture - Plan: PT plan of care cert/re-cert      G-Codes - 05/27/70 09/03/14    Functional Assessment Tool Used (Outpatient Only) FOTO; clinical judgement    Functional Limitation Mobility: Walking and moving around   Mobility: Walking and Moving Around Current Status [phone]) At least 60 percent but less than 80 percent impaired, limited or restricted   Mobility: Walking and Moving Around Goal Status [phone]) At least 40 percent but less than 60 percent impaired, limited or restricted       Problem List Patient Active Problem List   Diagnosis Date Noted  . Gout 09/04/2016  . Hyperlipidemia associated with type 2 diabetes mellitus ([place]) 06/05/2016  . Migraine 06/05/2016  . Vaginal bleeding 10/25/2015  . Encounter for therapeutic drug monitoring 09/13/2013  . Essential hypertension, benign 04/05/2013  . Atrial fibrillation ([place]) 03/18/2013  . Long term current use of anticoagulant therapy 03/11/2013  . Right knee DJD 02/25/2013  . Recurrent UTI 08/14/2011  . Pernicious anemia 02/21/2011  . Fatigue 12/30/2010  . Renal insufficiency 12/30/2010  . Hyperthyroidism 12/18/2010  . MIXED HYPERLIPIDEMIA 03/11/2010  . Overweight 03/11/2010  . Bipolar disorder ([place]) 03/11/2010  . DIVERTICULOSIS OF COLON 03/11/2010  . Constipation 03/11/2010  . Hx of mechanical aortic valve replacement 03/11/2010   Ruben Im, PT 12/30/16 9:25 PM Phone: [phone] Fax: [phone]  Alvera Singh 12/30/2016, 9:24 PM  [facility] [address], [place], [place], [address] Phone: [phone]   Fax:  [phone]  Name: [name] MRN: [id number] Date of Birth: 22-Aug-1950

## 2017-01-02 ENCOUNTER — Other Ambulatory Visit: Payer: Self-pay | Admitting: Family Medicine

## 2017-01-06 ENCOUNTER — Ambulatory Visit (INDEPENDENT_AMBULATORY_CARE_PROVIDER_SITE_OTHER): Payer: PPO | Admitting: Pharmacist

## 2017-01-06 DIAGNOSIS — Z952 Presence of prosthetic heart valve: Secondary | ICD-10-CM

## 2017-01-06 DIAGNOSIS — Z5181 Encounter for therapeutic drug level monitoring: Secondary | ICD-10-CM

## 2017-01-06 DIAGNOSIS — I4891 Unspecified atrial fibrillation: Secondary | ICD-10-CM | POA: Diagnosis not present

## 2017-01-06 LAB — POCT INR: INR: 1.6

## 2017-01-13 ENCOUNTER — Ambulatory Visit: Payer: PPO | Admitting: Physical Therapy

## 2017-01-13 DIAGNOSIS — M545 Low back pain: Principal | ICD-10-CM

## 2017-01-13 DIAGNOSIS — R293 Abnormal posture: Secondary | ICD-10-CM

## 2017-01-13 DIAGNOSIS — G8929 Other chronic pain: Secondary | ICD-10-CM

## 2017-01-13 DIAGNOSIS — M6281 Muscle weakness (generalized): Secondary | ICD-10-CM

## 2017-01-13 NOTE — Patient Instructions (Signed)
Treyvone Chelf PT [facility] [address], [address] [place], [place] [address] Phone # [phone] Fax [phone]    

## 2017-01-13 NOTE — Therapy (Signed)
[facility] [address], [place], [place], [address] Phone: [phone]   Fax:  [phone]  Physical Therapy Treatment  Patient Details  Name: [name] MRN: [id number] Date of Birth: [birth year] Referring Provider: Dr. Penni Homans   Encounter Date: 01/13/2017      PT End of Session - 01/13/17 1120    Visit Number 2   Date for PT Re-Evaluation March 08, 2017   Authorization Type G codes; KX at visit 15   PT Start Time 1103   PT Stop Time 1143   PT Time Calculation (min) 40 min   Activity Tolerance Patient tolerated treatment well      Past Medical History:  Diagnosis Date  . ANEMIA 05/28/2010  . AORTIC VALVE REPLACEMENT, HX OF 03/11/2010   Mechanical prosthesis  . BIPOLAR AFFECTIVE DISORDER 03/11/2010  . Depression   . Diverticulitis 12/18/2010  . FIBROIDS, UTERUS 03/11/2010  . Gout 09/04/2016  . Grave's disease 8-12  . Hyperlipidemia associated with type 2 diabetes mellitus ([place]) 06/05/2016  . Hypertension   . Migraine 06/05/2016  . Mixed hyperlipidemia 03/11/2010  . Obesity   . UTI (lower urinary tract infection) 08/14/2011    Past Surgical History:  Procedure Laterality Date  . ABDOMINAL HYSTERECTOMY  ?1998   sb/l spo, total for fibroids/heavy bleeding  . AORTIC VALVE REPLACEMENT  2011   Mechanical prosthesis, [employer]  . APPENDECTOMY     Required revision for EColi infection, required recurrent packing  . bowel obstruction     Requiring adhesions to be removed  . CHOLECYSTECTOMY    . COLONOSCOPY WITH PROPOFOL N/A 11/01/2015   Procedure: COLONOSCOPY WITH PROPOFOL;  Surgeon: Milus Banister, MD;  Location: [facility];  Service: Endoscopy;  Laterality: N/A;  . CYSTOCELE REPAIR N/A 10/16/2015   Procedure: ANTERIOR REPAIR (CYSTOCELE);  Surgeon: Donnamae Jude, MD;  Location: [facility];  Service: Gynecology;  Laterality: N/A;  . HERNIA REPAIR  08-16-10  . LEFT HEART CATHETERIZATION WITH CORONARY ANGIOGRAM N/A  12/05/2011   Procedure: LEFT HEART CATHETERIZATION WITH CORONARY ANGIOGRAM;  Surgeon: Sinclair Grooms, MD;  Location: [facility] CATH LAB;  Service: Cardiovascular;  Laterality: N/A;  . TOTAL KNEE ARTHROPLASTY Right Mar 08, 2013   Procedure: RIGHT TOTAL KNEE ARTHROPLASTY;  Surgeon: Johnn Hai, MD;  Location: [facility];  Service: Orthopedics;  Laterality: Right;  . TUBAL LIGATION    . varicose vein surgery b/l legs      There were no vitals filed for this visit.      Subjective Assessment - 01/13/17 1102    Subjective Moderate limp today secondary to left ankle pain.  It throws me off.  States she took a pain pill.  Some back pain.     Currently in Pain? Yes   Pain Score 2    Pain Location Back   Pain Orientation Left;Lower   Pain Type Chronic pain   Aggravating Factors  standing, walking   Pain Relieving Factors sitting; pain pills                         [facility] Adult PT Treatment/Exercise - 01/13/17 0001      Therapeutic Activites    Therapeutic Activities ADL's   ADL's walking, standing, sitting upright, stairs     Neuro Re-ed    Neuro Re-ed Details  transverse abdominus activation, gluteals     Lumbar Exercises: Stretches   Active Hamstring Stretch 3 reps;20 seconds  on  wedge   Active Hamstring Stretch Limitations with strap    Lower Trunk Rotation 5 reps;10 seconds  on wedge     Lumbar Exercises: Aerobic   Stationary Bike Nu-Step L1 6 min      Lumbar Exercises: Seated   Other Seated Lumbar Exercises foam roll push down 10x    Other Seated Lumbar Exercises 2# plyo ball hip to hip, hip to shoulder, Vs, ear to ear 10x each     Lumbar Exercises: Supine   Ab Set 10 reps  on wedge   Clam 15 reps  on wedge   Isometric Hip Flexion 10 reps  on wedge   Other Supine Lumbar Exercises ball squeeze 15x  on wedge                PT Education - 01/13/17 1120    Education provided Yes   Education Details supine abdominal brace and with hand to knee push    Person(s) Educated Patient   Methods Explanation;Demonstration;Handout   Comprehension Verbalized understanding;Returned demonstration          PT Short Term Goals - 01/13/17 1129      PT SHORT TERM GOAL #1   Title The patient will demonstrate understanding of initial HEP    Time 4   Period Weeks   Status On-going     PT SHORT TERM GOAL #2   Title The patient will be able to ambulate 250 feet with pain < or equal to 5/10   Time 4   Period Weeks   Status On-going     PT SHORT TERM GOAL #3   Title The patient will be able to stand for 8 min to wash dishes   Time 4   Period Weeks   Status On-going           PT Long Term Goals - 01/13/17 1131      PT LONG TERM GOAL #1   Title The patient will be independent with initial HEP needed for further improvements in strength and function    Time 8   Period Weeks   Status On-going     PT LONG TERM GOAL #2   Title The patient will have bilateral hip strength and lumbo/pelvic strength to grossly 4/5 needed for standing for meal prep 10-15 min   Time 8   Period Weeks   Status On-going     PT LONG TERM GOAL #3   Title The patient will be able to ambulate 400 feet needed for short distance community ambulation with pain level 4/10 or less   Time 8   Period Weeks   Status On-going     PT LONG TERM GOAL #4   Title FOTO functional outcome score improved to 53% limitation indicating improved function with less pain   Time 8   Period Weeks   Status On-going               Plan - 01/13/17 1126    Clinical Impression Statement The patient complains of a flare up of her left ankle/foot (has an internal plate) with a moderate limp.  Consequently will defer BERG and TUG tests until next visit.  Some mild increase in low back pain with supine position but overall her main complaint is ankle pain today.  Therapist closely monitoring response and modifying as needed.     Rehab Potential Good   Clinical Impairments Affecting  Rehab Potential see comorbidities above   PT Frequency 2x / week  PT Duration 8 weeks   PT Treatment/Interventions ADLs/Self Care Home Management;Electrical Stimulation;Cryotherapy;Moist Heat;Functional mobility training;Patient/family education;Neuromuscular re-education;Therapeutic exercise;Therapeutic activities;Manual techniques;Taping;Dry needling   PT Next Visit Plan check BERG and TUG;  neutral and flexion biased spinal exercises;  HS stretch;  abdominal brace and add clam ex in supine or sitting      Patient will benefit from skilled therapeutic intervention in order to improve the following deficits and impairments:  Abnormal gait, Decreased activity tolerance, Decreased endurance, Decreased strength, Difficulty walking, Pain, Postural dysfunction  Visit Diagnosis: Chronic bilateral low back pain without sciatica  Muscle weakness (generalized)  Abnormal posture     Problem List Patient Active Problem List   Diagnosis Date Noted  . Gout 09/04/2016  . Hyperlipidemia associated with type 2 diabetes mellitus ([place]) 06/05/2016  . Migraine 06/05/2016  . Vaginal bleeding 10/25/2015  . Encounter for therapeutic drug monitoring 09/13/2013  . Essential hypertension, benign 04/05/2013  . Atrial fibrillation ([place]) 03/18/2013  . Long term current use of anticoagulant therapy 03/11/2013  . Right knee DJD 02/25/2013  . Recurrent UTI 08/14/2011  . Pernicious anemia 02/21/2011  . Fatigue 12/30/2010  . Renal insufficiency 12/30/2010  . Hyperthyroidism 12/18/2010  . MIXED HYPERLIPIDEMIA 03/11/2010  . Overweight 03/11/2010  . Bipolar disorder ([place]) 03/11/2010  . DIVERTICULOSIS OF COLON 03/11/2010  . Constipation 03/11/2010  . Hx of mechanical aortic valve replacement 03/11/2010   Ruben Im, PT 01/13/17 11:39 AM Phone: [phone] Fax: [phone]  Alvera Singh 01/13/2017, 11:39 AM  [facility] [address],  [place], [place], [address] Phone: [phone]   Fax:  [phone]  Name: [name] MRN: [id number] Date of Birth: [birth year]

## 2017-01-15 ENCOUNTER — Ambulatory Visit (INDEPENDENT_AMBULATORY_CARE_PROVIDER_SITE_OTHER): Payer: PPO

## 2017-01-15 ENCOUNTER — Ambulatory Visit: Payer: PPO

## 2017-01-15 DIAGNOSIS — G8929 Other chronic pain: Secondary | ICD-10-CM

## 2017-01-15 DIAGNOSIS — R293 Abnormal posture: Secondary | ICD-10-CM

## 2017-01-15 DIAGNOSIS — I4891 Unspecified atrial fibrillation: Secondary | ICD-10-CM

## 2017-01-15 DIAGNOSIS — M545 Low back pain, unspecified: Secondary | ICD-10-CM

## 2017-01-15 DIAGNOSIS — Z952 Presence of prosthetic heart valve: Secondary | ICD-10-CM

## 2017-01-15 DIAGNOSIS — Z5181 Encounter for therapeutic drug level monitoring: Secondary | ICD-10-CM

## 2017-01-15 DIAGNOSIS — M6281 Muscle weakness (generalized): Secondary | ICD-10-CM

## 2017-01-15 LAB — POCT INR: INR: 4.1

## 2017-01-15 NOTE — Therapy (Signed)
[facility] [address] [place], [place], [address] Phone: [phone]   Fax:  [phone]  Physical Therapy Treatment  Patient Details  Name: [name] MRN: [id number] Date of Birth: 1950-10-21 Referring Provider: Dr. Penni Homans   Encounter Date: 01/15/2017      PT End of Session - 01/15/17 1159    Visit Number 3   Date for PT Re-Evaluation 2017/03/26   Authorization Type G codes; KX at visit 15   PT Start Time 3094   PT Stop Time 1245   PT Time Calculation (min) 53 min   Activity Tolerance Patient tolerated treatment well      Past Medical History:  Diagnosis Date  . ANEMIA 05/28/2010  . AORTIC VALVE REPLACEMENT, HX OF 03/11/2010   Mechanical prosthesis  . BIPOLAR AFFECTIVE DISORDER 03/11/2010  . Depression   . Diverticulitis 12/18/2010  . FIBROIDS, UTERUS 03/11/2010  . Gout 09/04/2016  . Grave's disease 8-12  . Hyperlipidemia associated with type 2 diabetes mellitus ([place]) 06/05/2016  . Hypertension   . Migraine 06/05/2016  . Mixed hyperlipidemia 03/11/2010  . Obesity   . UTI (lower urinary tract infection) 08/14/2011    Past Surgical History:  Procedure Laterality Date  . ABDOMINAL HYSTERECTOMY  ?1998   sb/l spo, total for fibroids/heavy bleeding  . AORTIC VALVE REPLACEMENT  2011   Mechanical prosthesis, [employer]  . APPENDECTOMY     Required revision for EColi infection, required recurrent packing  . bowel obstruction     Requiring adhesions to be removed  . CHOLECYSTECTOMY    . COLONOSCOPY WITH PROPOFOL N/A 11/01/2015   Procedure: COLONOSCOPY WITH PROPOFOL;  Surgeon: Milus Banister, MD;  Location: [facility];  Service: Endoscopy;  Laterality: N/A;  . CYSTOCELE REPAIR N/A 10/16/2015   Procedure: ANTERIOR REPAIR (CYSTOCELE);  Surgeon: Donnamae Jude, MD;  Location: [facility];  Service: Gynecology;  Laterality: N/A;  . HERNIA REPAIR  08-16-10  . LEFT HEART CATHETERIZATION WITH CORONARY ANGIOGRAM N/A  12/05/2011   Procedure: LEFT HEART CATHETERIZATION WITH CORONARY ANGIOGRAM;  Surgeon: Sinclair Grooms, MD;  Location: [facility] C[facility];  Service: Cardiovascular;  Laterality: N/A;  . TOTAL KNEE ARTHROPLASTY Right 26-Mar-2013   Procedure: RIGHT TOTAL KNEE ARTHROPLASTY;  Surgeon: Johnn Hai, MD;  Location: [facility];  Service: Orthopedics;  Laterality: Right;  . TUBAL LIGATION    . varicose vein surgery b/l legs      There were no vitals filed for this visit.      Subjective Assessment - 01/15/17 1154    Subjective Pt. noting L ankle pain is bothering her and is primary concern today.  L ankle ony hurting while wt. bearing.     Patient Stated Goals see if there is a leg length discrepancy; I want my back to feel better;  do I need orthotics   Currently in Pain? Yes   Pain Score 1    Pain Location Back   Pain Orientation Left;Lower   Pain Type Chronic pain   Pain Onset More than a month ago   Pain Frequency Intermittent   Aggravating Factors  standing, walking    Pain Relieving Factors sitting,    Multiple Pain Sites No                         [facility] Adult PT Treatment/Exercise - 01/15/17 1207      Lumbar Exercises: Aerobic   Stationary Bike NuStep  L1 10 min      Lumbar Exercises: Supine   Ab Set 10 reps   AB Set Limitations with sustained B hip abd/ER with green TB at knees    Glut Set 10 reps;5 seconds   Bent Knee Raise 15 reps;3 seconds   Bent Knee Raise Limitations with abdom. brace      Modalities   Modalities Electrical Stimulation;Moist Heat     Moist Heat Therapy   Number Minutes Moist Heat 15 Minutes   Moist Heat Location Lumbar Spine     Electrical Stimulation   Electrical Stimulation Location thoracic paraspinals    Electrical Stimulation Action IFC   Electrical Stimulation Parameters 80-150Hz , intensity to pt. tolerance, 15'    Electrical Stimulation Goals Tone  spasm                  PT Short Term Goals - 01/13/17 1129      PT  SHORT TERM GOAL #1   Title The patient will demonstrate understanding of initial HEP    Time 4   Period Weeks   Status On-going     PT SHORT TERM GOAL #2   Title The patient will be able to ambulate 250 feet with pain < or equal to 5/10   Time 4   Period Weeks   Status On-going     PT SHORT TERM GOAL #3   Title The patient will be able to stand for 8 min to wash dishes   Time 4   Period Weeks   Status On-going           PT Long Term Goals - 01/13/17 1131      PT LONG TERM GOAL #1   Title The patient will be independent with initial HEP needed for further improvements in strength and function    Time 8   Period Weeks   Status On-going     PT LONG TERM GOAL #2   Title The patient will have bilateral hip strength and lumbo/pelvic strength to grossly 4/5 needed for standing for meal prep 10-15 min   Time 8   Period Weeks   Status On-going     PT LONG TERM GOAL #3   Title The patient will be able to ambulate 400 feet needed for short distance community ambulation with pain level 4/10 or less   Time 8   Period Weeks   Status On-going     PT LONG TERM GOAL #4   Title FOTO functional outcome score improved to 53% limitation indicating improved function with less pain   Time 8   Period Weeks   Status On-going               Plan - 01/15/17 1159    Clinical Impression Statement Pt. seen with primary concern being ankle pain while wt. bearing today.  Considering ankle pain and moderate limp, BERG and TUG testing deferred.  Pt. noting B knees are bruised since fall a few weeks ago while carrying groceries.  Flexion biased lumbopelvic strengthening activities performed today until pt. with onset of "muscle spasms" thus treatment ending with moist heat/E-stim to relieve spasms and relax musculature.      PT Treatment/Interventions ADLs/Self Care Home Management;Electrical Stimulation;Cryotherapy;Moist Heat;Functional mobility training;Patient/family  education;Neuromuscular re-education;Therapeutic exercise;Therapeutic activities;Manual techniques;Taping;Dry needling   PT Next Visit Plan check BERG and TUG as pt. able;  neutral and flexion biased spinal exercises;  HS stretch;  abdominal brace and add clam ex in supine or  sitting      Patient will benefit from skilled therapeutic intervention in order to improve the following deficits and impairments:  Abnormal gait, Decreased activity tolerance, Decreased endurance, Decreased strength, Difficulty walking, Pain, Postural dysfunction  Visit Diagnosis: Chronic bilateral low back pain without sciatica  Muscle weakness (generalized)  Abnormal posture     Problem List Patient Active Problem List   Diagnosis Date Noted  . Gout 09/04/2016  . Hyperlipidemia associated with type 2 diabetes mellitus ([place]) 06/05/2016  . Migraine 06/05/2016  . Vaginal bleeding 10/25/2015  . Encounter for therapeutic drug monitoring 09/13/2013  . Essential hypertension, benign 04/05/2013  . Atrial fibrillation ([place]) 03/18/2013  . Long term current use of anticoagulant therapy 03/11/2013  . Right knee DJD 02/25/2013  . Recurrent UTI 08/14/2011  . Pernicious anemia 02/21/2011  . Fatigue 12/30/2010  . Renal insufficiency 12/30/2010  . Hyperthyroidism 12/18/2010  . MIXED HYPERLIPIDEMIA 03/11/2010  . Overweight 03/11/2010  . Bipolar disorder ([place]) 03/11/2010  . DIVERTICULOSIS OF COLON 03/11/2010  . Constipation 03/11/2010  . Hx of mechanical aortic valve replacement 03/11/2010    Bess Harvest, PTA 01/15/17 2:16 PM  [facility] [address] [place], [place], [address] Phone: [phone]   Fax:  [phone]  Name: [name] MRN: [id number] Date of Birth: Jun 20, 1950

## 2017-01-19 ENCOUNTER — Ambulatory Visit: Payer: PPO | Admitting: Physical Therapy

## 2017-01-19 ENCOUNTER — Encounter: Payer: Self-pay | Admitting: Physical Therapy

## 2017-01-19 DIAGNOSIS — R293 Abnormal posture: Secondary | ICD-10-CM

## 2017-01-19 DIAGNOSIS — M545 Low back pain, unspecified: Secondary | ICD-10-CM

## 2017-01-19 DIAGNOSIS — G8929 Other chronic pain: Secondary | ICD-10-CM

## 2017-01-19 DIAGNOSIS — M6281 Muscle weakness (generalized): Secondary | ICD-10-CM

## 2017-01-19 NOTE — Therapy (Signed)
[facility] [address] [place], [place], [address] Phone: [phone]   Fax:  [phone]  Physical Therapy Treatment  Patient Details  Name: [name] MRN: [id number] Date of Birth: September 07, 1950 Referring Provider: Dr. Penni Homans   Encounter Date: 01/19/2017      PT End of Session - 01/19/17 1142    Visit Number 4   Date for PT Re-Evaluation Mar 16, 2017   Authorization Type G codes; KX at visit 15   PT Start Time 1141   PT Stop Time 1235   PT Time Calculation (min) 54 min   Activity Tolerance Patient limited by pain   Behavior During Therapy [facility] for tasks assessed/performed  [place]      Past Medical History:  Diagnosis Date  . ANEMIA 05/28/2010  . AORTIC VALVE REPLACEMENT, HX OF 03/11/2010   Mechanical prosthesis  . BIPOLAR AFFECTIVE DISORDER 03/11/2010  . Depression   . Diverticulitis 12/18/2010  . FIBROIDS, UTERUS 03/11/2010  . Gout 09/04/2016  . Grave's disease 8-12  . Hyperlipidemia associated with type 2 diabetes mellitus ([place]) 06/05/2016  . Hypertension   . Migraine 06/05/2016  . Mixed hyperlipidemia 03/11/2010  . Obesity   . UTI (lower urinary tract infection) 08/14/2011    Past Surgical History:  Procedure Laterality Date  . ABDOMINAL HYSTERECTOMY  ?1998   sb/l spo, total for fibroids/heavy bleeding  . AORTIC VALVE REPLACEMENT  2011   Mechanical prosthesis, [employer]  . APPENDECTOMY     Required revision for EColi infection, required recurrent packing  . bowel obstruction     Requiring adhesions to be removed  . CHOLECYSTECTOMY    . COLONOSCOPY WITH PROPOFOL N/A 11/01/2015   Procedure: COLONOSCOPY WITH PROPOFOL;  Surgeon: Milus Banister, MD;  [facility];  Service: Endoscopy;  Laterality: N/A;  . CYSTOCELE REPAIR N/A 10/16/2015   Procedure: ANTERIOR REPAIR (CYSTOCELE);  Surgeon: Donnamae Jude, MD;  Location: [facility];  Service: Gynecology;  Laterality: N/A;  . HERNIA REPAIR  08-16-10   . LEFT HEART CATHETERIZATION WITH CORONARY ANGIOGRAM N/A 12/05/2011   Procedure: LEFT HEART CATHETERIZATION WITH CORONARY ANGIOGRAM;  Surgeon: Sinclair Grooms, MD;  Location: [facility] CATH LAB;  Service: Cardiovascular;  Laterality: N/A;  . TOTAL KNEE ARTHROPLASTY Right 03-16-2013   Procedure: RIGHT TOTAL KNEE ARTHROPLASTY;  Surgeon: Johnn Hai, MD;  [facility];  Service: Orthopedics;  Laterality: Right;  . TUBAL LIGATION    . varicose vein surgery b/l legs      There were no vitals filed for this visit.      Subjective Assessment - 01/19/17 1143    Subjective Pain is bad this AM in my back and hip. I have not taken any medication for this, just heating pad.    Pertinent History mechanical heart valve following aneurysm; open heart surgery;  TKR right;  left ankle multi fracture a few years ago with hardware   Currently in Pain? Yes   Pain Score 8    Pain Location --  Across the low back and it switches hips.    Pain Descriptors / Indicators Radiating   Aggravating Factors  Standing, walking   Pain Relieving Factors Sit                         [facility] Adult PT Treatment/Exercise - 01/19/17 0001      Lumbar Exercises: Stretches   Single Knee to Chest Stretch 3 reps;10 seconds  bil   Lower Trunk Rotation 5 reps;10 seconds  on wedge     Lumbar Exercises: Supine   Ab Set 20 reps     Cryotherapy   Number Minutes Cryotherapy 15 Minutes   Cryotherapy Location Lumbar Spine   Type of Cryotherapy Ice pack     Electrical Stimulation   Electrical Stimulation Location Lumbar bil   Electrical Stimulation Action IFC   Electrical Stimulation Parameters 80-150 HZ in LT SL with pillowsSmall towel roll for neck   Electrical Stimulation Goals Pain                  PT Short Term Goals - 01/19/17 1216      PT SHORT TERM GOAL #2   Title The patient will be able to ambulate 250 feet with pain < or equal to 5/10   Time 4   Period Weeks   Status --  pt has  antalgic gait today.      PT SHORT TERM GOAL #3   Title The patient will be able to stand for 8 min to wash dishes   Time 4   Period Weeks   Status On-going  Cannot do due to pain.           PT Long Term Goals - 01/13/17 1131      PT LONG TERM GOAL #1   Title The patient will be independent with initial HEP needed for further improvements in strength and function    Time 8   Period Weeks   Status On-going     PT LONG TERM GOAL #2   Title The patient will have bilateral hip strength and lumbo/pelvic strength to grossly 4/5 needed for standing for meal prep 10-15 min   Time 8   Period Weeks   Status On-going     PT LONG TERM GOAL #3   Title The patient will be able to ambulate 400 feet needed for short distance community ambulation with pain level 4/10 or less   Time 8   Period Weeks   Status On-going     PT LONG TERM GOAL #4   Title FOTO functional outcome score improved to 53% limitation indicating improved function with less pain   Time 8   Period Weeks   Status On-going               Plan - 01/19/17 1142    Clinical Impression Statement Pt in a lot of pain today in her back and intermittently into her hips. She sees the MD tomorrow. She was teary throughout treatment.  She demonstrated pain with all transitional movements and required assistance to sit up and roll.    Rehab Potential Good   Clinical Impairments Affecting Rehab Potential see comorbidities above   PT Frequency 2x / week   PT Duration 8 weeks   PT Treatment/Interventions ADLs/Self Care Home Management;Electrical Stimulation;Cryotherapy;Moist Heat;Functional mobility training;Patient/family education;Neuromuscular re-education;Therapeutic exercise;Therapeutic activities;Manual techniques;Taping;Dry needling   PT Next Visit Plan To MD, see what MD says. Pt encouraged to speak with MD about her pain and what medications she might be able to take as she reports she is limited in what she take.     Consulted and Agree with Plan of Care Patient      Patient will benefit from skilled therapeutic intervention in order to improve the following deficits and impairments:  Abnormal gait, Decreased activity tolerance, Decreased endurance, Decreased strength, Difficulty walking, Pain, Postural dysfunction  Visit Diagnosis: Chronic bilateral low  back pain without sciatica  Muscle weakness (generalized)  Abnormal posture     Problem List Patient Active Problem List   Diagnosis Date Noted  . Gout 09/04/2016  . Hyperlipidemia associated with type 2 diabetes mellitus ([place]) 06/05/2016  . Migraine 06/05/2016  . Vaginal bleeding 10/25/2015  . Encounter for therapeutic drug monitoring 09/13/2013  . Essential hypertension, benign 04/05/2013  . Atrial fibrillation ([name]) 03/18/2013  . Long term current use of anticoagulant therapy 03/11/2013  . Right knee DJD 02/25/2013  . Recurrent UTI 08/14/2011  . Pernicious anemia 02/21/2011  . Fatigue 12/30/2010  . Renal insufficiency 12/30/2010  . Hyperthyroidism 12/18/2010  . MIXED HYPERLIPIDEMIA 03/11/2010  . Overweight 03/11/2010  . Bipolar disorder ([place]) 03/11/2010  . DIVERTICULOSIS OF COLON 03/11/2010  . Constipation 03/11/2010  . Hx of mechanical aortic valve replacement 03/11/2010    Brayla Pat, PTA 01/19/2017, 12:18 PM  [facility] [address] [place], [place], [address] Phone: [phone]   Fax:  [phone]  Name: GESSELLE FITZSIMONS MRN: [id number] Date of Birth: 1950-11-17

## 2017-01-20 ENCOUNTER — Ambulatory Visit: Payer: PPO | Admitting: Family Medicine

## 2017-01-21 ENCOUNTER — Encounter: Payer: Self-pay | Admitting: Physical Therapy

## 2017-01-21 ENCOUNTER — Ambulatory Visit: Payer: PPO | Admitting: Physical Therapy

## 2017-01-21 DIAGNOSIS — R293 Abnormal posture: Secondary | ICD-10-CM

## 2017-01-21 DIAGNOSIS — G8929 Other chronic pain: Secondary | ICD-10-CM

## 2017-01-21 DIAGNOSIS — M6281 Muscle weakness (generalized): Secondary | ICD-10-CM

## 2017-01-21 DIAGNOSIS — M545 Low back pain, unspecified: Secondary | ICD-10-CM

## 2017-01-21 NOTE — Therapy (Signed)
[facility] [address] [place], [place], [address] Phone: [phone]   Fax:  [phone]  Physical Therapy Treatment  Patient Details  Name: [name] MRN: [id number] Date of Birth: May 08, 1951 Referring Provider: Dr. Penni Homans   Encounter Date: 01/21/2017      PT End of Session - 01/21/17 1140    Visit Number 5   Date for PT Re-Evaluation 2017/02/27   Authorization Type G codes; KX at visit 15   PT Start Time 1139   PT Stop Time 1230   PT Time Calculation (min) 51 min   Activity Tolerance Patient limited by pain  Slightly better than last session as pt did not need asst to lay down & get up.    Behavior During Therapy [facility] for tasks assessed/performed      Past Medical History:  Diagnosis Date  . ANEMIA 05/28/2010  . AORTIC VALVE REPLACEMENT, HX OF 03/11/2010   Mechanical prosthesis  . BIPOLAR AFFECTIVE DISORDER 03/11/2010  . Depression   . Diverticulitis 12/18/2010  . FIBROIDS, UTERUS 03/11/2010  . Gout 09/04/2016  . Grave's disease 8-12  . Hyperlipidemia associated with type 2 diabetes mellitus ([place]) 06/05/2016  . Hypertension   . Migraine 06/05/2016  . Mixed hyperlipidemia 03/11/2010  . Obesity   . UTI (lower urinary tract infection) 08/14/2011    Past Surgical History:  Procedure Laterality Date  . ABDOMINAL HYSTERECTOMY  ?1998   sb/l spo, total for fibroids/heavy bleeding  . AORTIC VALVE REPLACEMENT  2011   Mechanical prosthesis, [employer]  . APPENDECTOMY     Required revision for EColi infection, required recurrent packing  . bowel obstruction     Requiring adhesions to be removed  . CHOLECYSTECTOMY    . COLONOSCOPY WITH PROPOFOL N/A 11/01/2015   Procedure: COLONOSCOPY WITH PROPOFOL;  Surgeon: Milus Banister, MD;  [facility];  Service: Endoscopy;  Laterality: N/A;  . CYSTOCELE REPAIR N/A 10/16/2015   Procedure: ANTERIOR REPAIR (CYSTOCELE);  Surgeon: Donnamae Jude, MD;  Location:  [place] [facility];  Service: Gynecology;  Laterality: N/A;  . HERNIA REPAIR  08-16-10  . LEFT HEART CATHETERIZATION WITH CORONARY ANGIOGRAM N/A 12/05/2011   Procedure: LEFT HEART CATHETERIZATION WITH CORONARY ANGIOGRAM;  Surgeon: Sinclair Grooms, MD;  Location: [facility] CATH LAB;  Service: Cardiovascular;  Laterality: N/A;  . TOTAL KNEE ARTHROPLASTY Right 02/27/13   Procedure: RIGHT TOTAL KNEE ARTHROPLASTY;  Surgeon: Johnn Hai, MD;  [facility];  Service: Orthopedics;  Laterality: Right;  . TUBAL LIGATION    . varicose vein surgery b/l legs      There were no vitals filed for this visit.      Subjective Assessment - 01/21/17 1146    Subjective Pain is about the same despite walking better. The pain is more in her back today but still at a high level. Hip pain is better today. Did not see MD, see clinical impression for reason why.    Currently in Pain? Yes   Pain Score 8    Pain Location Back   Pain Orientation Right;Left;Lower   Pain Descriptors / Indicators Radiating   Pain Relieving Factors ice and Estim were helpful   Multiple Pain Sites No                         [facility] Adult PT Treatment/Exercise - 01/21/17 0001      Lumbar Exercises: Stretches   Single Knee to  Chest Stretch --  Lt knee to Rt shoulder 2x 15 sec   Lower Trunk Rotation 5 reps;10 seconds  on wedge     Lumbar Exercises: Supine   Ab Set 20 reps   Glut Set --  Ball squeeze & glute squeeze 6x    Other Supine Lumbar Exercises decompression position with breathing x 2 min     Cryotherapy   Number Minutes Cryotherapy 15 Minutes   Cryotherapy Location Lumbar Spine   Type of Cryotherapy Ice pack     Electrical Stimulation   Electrical Stimulation Location Lumbar bil   Electrical Stimulation Action IFc   Electrical Stimulation Parameters 80-150 Hz in SL with towel roll for neck and pillows bt knees   Electrical Stimulation Goals Pain     Manual Therapy   Manual therapy comments Gentle long axis  leg traction bil 3x 20 sec. No pain                  PT Short Term Goals - 01/19/17 1216      PT SHORT TERM GOAL #2   Title The patient will be able to ambulate 250 feet with pain < or equal to 5/10   Time 4   Period Weeks   Status --  pt has antalgic gait today.      PT SHORT TERM GOAL #3   Title The patient will be able to stand for 8 min to wash dishes   Time 4   Period Weeks   Status On-going  Cannot do due to pain.           PT Long Term Goals - 01/13/17 1131      PT LONG TERM GOAL #1   Title The patient will be independent with initial HEP needed for further improvements in strength and function    Time 8   Period Weeks   Status On-going     PT LONG TERM GOAL #2   Title The patient will have bilateral hip strength and lumbo/pelvic strength to grossly 4/5 needed for standing for meal prep 10-15 min   Time 8   Period Weeks   Status On-going     PT LONG TERM GOAL #3   Title The patient will be able to ambulate 400 feet needed for short distance community ambulation with pain level 4/10 or less   Time 8   Period Weeks   Status On-going     PT LONG TERM GOAL #4   Title FOTO functional outcome score improved to 53% limitation indicating improved function with less pain   Time 8   Period Weeks   Status On-going               Plan - 01/21/17 1140    Clinical Impression Statement Pt was not able to keep her MD appt yesterday as her balance was so bad, per pt, that she was not safe to walk.  The pain in her back persists at a high level but today she does report no hip pain and demonstrates a less antalgic gait. She reorts getting excellent relief last session from the Estim and would like to repeat that.  She continues to use ice on her back at home and in the clinic as she reports this gives her better pain relief than the heat. She will see her MD next Thursday. Antalgic gait returned at  the end of the session as she exited the building.     Rehab Potential  Good   Clinical Impairments Affecting Rehab Potential see comorbidities above   PT Frequency 2x / week   PT Duration 8 weeks   PT Treatment/Interventions ADLs/Self Care Home Management;Electrical Stimulation;Cryotherapy;Moist Heat;Functional mobility training;Patient/family education;Neuromuscular re-education;Therapeutic exercise;Therapeutic activities;Manual techniques;Taping;Dry needling   PT Next Visit Plan To MD next Thursday. Pain management/reduction techniques, gentle core activation,       Patient will benefit from skilled therapeutic intervention in order to improve the following deficits and impairments:  Abnormal gait, Decreased activity tolerance, Decreased endurance, Decreased strength, Difficulty walking, Pain, Postural dysfunction  Visit Diagnosis: Muscle weakness (generalized)  Chronic bilateral low back pain without sciatica  Abnormal posture     Problem List Patient Active Problem List   Diagnosis Date Noted  . Gout 09/04/2016  . Hyperlipidemia associated with type 2 diabetes mellitus ([place]) 06/05/2016  . Migraine 06/05/2016  . Vaginal bleeding 10/25/2015  . Encounter for therapeutic drug monitoring 09/13/2013  . Essential hypertension, benign 04/05/2013  . Atrial fibrillation ([place]) 03/18/2013  . Long term current use of anticoagulant therapy 03/11/2013  . Right knee DJD 02/25/2013  . Recurrent UTI 08/14/2011  . Pernicious anemia 02/21/2011  . Fatigue 12/30/2010  . Renal insufficiency 12/30/2010  . Hyperthyroidism 12/18/2010  . MIXED HYPERLIPIDEMIA 03/11/2010  . Overweight 03/11/2010  . Bipolar disorder ([place]) 03/11/2010  . DIVERTICULOSIS OF COLON 03/11/2010  . Constipation 03/11/2010  . Hx of mechanical aortic valve replacement 03/11/2010    Makarios Madlock, PTA 01/21/2017, 12:23 PM  [facility] [address] [place], [place], [address] Phone: [phone]   Fax:   [phone]  Name: [name] MRN: [id number] Date of Birth: [birth year]

## 2017-01-27 ENCOUNTER — Ambulatory Visit: Payer: PPO | Attending: Family Medicine | Admitting: Physical Therapy

## 2017-01-27 DIAGNOSIS — G8929 Other chronic pain: Secondary | ICD-10-CM | POA: Diagnosis not present

## 2017-01-27 DIAGNOSIS — M6281 Muscle weakness (generalized): Secondary | ICD-10-CM | POA: Insufficient documentation

## 2017-01-27 DIAGNOSIS — M545 Low back pain: Secondary | ICD-10-CM | POA: Diagnosis not present

## 2017-01-27 DIAGNOSIS — R293 Abnormal posture: Secondary | ICD-10-CM | POA: Diagnosis not present

## 2017-01-27 NOTE — Patient Instructions (Signed)
[facility] [address], [address] [place], [place] [address] Phone # [phone] Fax [phone]    

## 2017-01-27 NOTE — Therapy (Addendum)
[facility] [address], [place], [place], [address] Phone: [phone]   Fax:  [phone]  Physical Therapy Treatment/Discharge Summary  Patient Details  Name: [name] MRN: [id number] Date of Birth: 1950/09/04 Referring Provider: Dr. Penni Homans   Encounter Date: 01/27/2017      PT End of Session - 01/27/17 1456    Visit Number 6   Date for PT Re-Evaluation March 07, 2017   Authorization Type G codes; KX at visit 15   PT Start Time 1435   PT Stop Time 1515   PT Time Calculation (min) 40 min   Activity Tolerance Patient tolerated treatment well      Past Medical History:  Diagnosis Date  . ANEMIA 05/28/2010  . AORTIC VALVE REPLACEMENT, HX OF 03/11/2010   Mechanical prosthesis  . BIPOLAR AFFECTIVE DISORDER 03/11/2010  . Depression   . Diverticulitis 12/18/2010  . FIBROIDS, UTERUS 03/11/2010  . Gout 09/04/2016  . Grave's disease 8-12  . Hyperlipidemia associated with type 2 diabetes mellitus ([place]) 06/05/2016  . Hypertension   . Migraine 06/05/2016  . Mixed hyperlipidemia 03/11/2010  . Obesity   . UTI (lower urinary tract infection) 08/14/2011    Past Surgical History:  Procedure Laterality Date  . ABDOMINAL HYSTERECTOMY  ?1998   sb/l spo, total for fibroids/heavy bleeding  . AORTIC VALVE REPLACEMENT  2011   Mechanical prosthesis, [employer]  . APPENDECTOMY     Required revision for EColi infection, required recurrent packing  . bowel obstruction     Requiring adhesions to be removed  . CHOLECYSTECTOMY    . COLONOSCOPY WITH PROPOFOL N/A 11/01/2015   Procedure: COLONOSCOPY WITH PROPOFOL;  Surgeon: Milus Banister, MD;  Location: [facility];  Service: Endoscopy;  Laterality: N/A;  . CYSTOCELE REPAIR N/A 10/16/2015   Procedure: ANTERIOR REPAIR (CYSTOCELE);  Surgeon: Donnamae Jude, MD;  Location: [facility];  Service: Gynecology;  Laterality: N/A;  . HERNIA REPAIR  08-16-10  . LEFT HEART CATHETERIZATION WITH  CORONARY ANGIOGRAM N/A 12/05/2011   Procedure: LEFT HEART CATHETERIZATION WITH CORONARY ANGIOGRAM;  Surgeon: Sinclair Grooms, MD;  Location: [facility];  Service: Cardiovascular;  Laterality: N/A;  . TOTAL KNEE ARTHROPLASTY Right 07-Mar-2013   Procedure: RIGHT TOTAL KNEE ARTHROPLASTY;  Surgeon: Johnn Hai, MD;  Location: [facility];  Service: Orthopedics;  Laterality: Right;  . TUBAL LIGATION    . varicose vein surgery b/l legs      There were no vitals filed for this visit.      Subjective Assessment - 01/27/17 1437    Subjective Patient states she is off balance and had to lower herself down on the bathroom floor this morning.  Reports she did not get injured.  Going to [place] for 7 weeks so she cancelled all her remaining appts.      Pertinent History mechanical heart valve following aneurysm; open heart surgery;  TKR right;  left ankle multi fracture a few years ago with hardware   Currently in Pain? No/denies   Pain Score 0-No pain   Pain Location Back   Pain Orientation Lower   Pain Type Chronic pain                         [facility] Adult PT Treatment/Exercise - 01/27/17 0001      Therapeutic Activites    ADL's walking, standing, sitting upright, stairs     Neuro Re-ed    Neuro Re-ed  Details  transverse abdominus activation, balance in standing;  See HEP     Lumbar Exercises: Stretches   Active Hamstring Stretch 3 reps;20 seconds  on wedge   Active Hamstring Stretch Limitations with strap    Lower Trunk Rotation 5 reps;10 seconds  on wedge     Lumbar Exercises: Aerobic   Stationary Bike NuStep L1 12 min      Lumbar Exercises: Seated   Other Seated Lumbar Exercises chair back push down 10x      Lumbar Exercises: Supine   Ab Set 10 reps   Bent Knee Raise 5 reps   Isometric Hip Flexion 10 reps  on wedge                PT Education - 01/27/17 1451    Education provided Yes   Education Details low level balance ex   Person(s) Educated Patient    Methods Explanation;Demonstration;Handout   Comprehension Verbalized understanding;Returned demonstration          PT Short Term Goals - 01/27/17 1516      PT SHORT TERM GOAL #1   Title The patient will demonstrate understanding of initial HEP    Status Achieved     PT SHORT TERM GOAL #2   Title The patient will be able to ambulate 250 feet with pain < or equal to 5/10   Status Achieved     PT SHORT TERM GOAL #3   Title The patient will be able to stand for 8 min to wash dishes   Baseline 5 min   Time 4   Period Weeks   Status On-going           PT Long Term Goals - 01/27/17 1518      PT LONG TERM GOAL #1   Title The patient will be independent with initial HEP needed for further improvements in strength and function    Time 8   Period Weeks   Status On-going     PT LONG TERM GOAL #2   Title The patient will have bilateral hip strength and lumbo/pelvic strength to grossly 4/5 needed for standing for meal prep 10-15 min   Time 8   Period Weeks   Status On-going     PT LONG TERM GOAL #3   Title The patient will be able to ambulate 400 feet needed for short distance community ambulation with pain level 4/10 or less   Time 8   Period Weeks   Status On-going     PT LONG TERM GOAL #4   Title FOTO functional outcome score improved to 53% limitation indicating improved function with less pain   Time 8   Period Weeks   Status On-going               Plan - 01/27/17 1524    Clinical Impression Statement Patient's primary complaint is her balance today.  No pain.  She is going out of town for 7 weeks and was instructed in a low level HEP for balance and her previous HEP was reviewed for low back core strengthening and flexibility.  Her walking endurance has improved with 250 feet without complaint of back pain.  She continues to be limited in standing tolerance to 5 minutes.  Multiple co-morbidities and variable status have slowed progress somewhat.     Rehab  Potential Good   Clinical Impairments Affecting Rehab Potential see comorbidities above   PT Frequency 2x / week   PT Duration 8  weeks   PT Treatment/Interventions ADLs/Self Care Home Management;Electrical Stimulation;Cryotherapy;Moist Heat;Functional mobility training;Patient/family education;Neuromuscular re-education;Therapeutic exercise;Therapeutic activities;Manual techniques;Taping;Dry needling   PT Next Visit Plan Patient to call and follow up after her trip to [place]-  will return in October;  if patient has not called to follow up by November will discharge from PT      Patient will benefit from skilled therapeutic intervention in order to improve the following deficits and impairments:  Abnormal gait, Decreased activity tolerance, Decreased endurance, Decreased strength, Difficulty walking, Pain, Postural dysfunction  Visit Diagnosis: Muscle weakness (generalized)  Chronic bilateral low back pain without sciatica  Abnormal posture  PHYSICAL THERAPY DISCHARGE SUMMARY  Visits from Start of Care: 6  Current functional level related to goals / functional outcomes: The patient went out of town and has not returned to PT in 2 1/2 months.  Will discharge from PT at this time with partial goals met.    Remaining deficits: As above   Education / Equipment: Initial HEP Plan: Patient agrees to discharge.  Patient goals were partially met. Patient is being discharged due to not returning since the last visit.  ?????          G code:  Mobility/moving around  Goal CK, Discharge CL  Problem List Patient Active Problem List   Diagnosis Date Noted  . Gout 09/04/2016  . Hyperlipidemia associated with type 2 diabetes mellitus ([place]) 06/05/2016  . Migraine 06/05/2016  . Vaginal bleeding 10/25/2015  . Encounter for therapeutic drug monitoring 09/13/2013  . Essential hypertension, benign 04/05/2013  . Atrial fibrillation ([place]) 03/18/2013  . Long term current use of anticoagulant  therapy 03/11/2013  . Right knee DJD 02/25/2013  . Recurrent UTI 08/14/2011  . Pernicious anemia 02/21/2011  . Fatigue 12/30/2010  . Renal insufficiency 12/30/2010  . Hyperthyroidism 12/18/2010  . MIXED HYPERLIPIDEMIA 03/11/2010  . Overweight 03/11/2010  . Bipolar disorder ([place]) 03/11/2010  . DIVERTICULOSIS OF COLON 03/11/2010  . Constipation 03/11/2010  . Hx of mechanical aortic valve replacement 03/11/2010   Ruben Im, PT 01/27/17 3:50 PM Phone: [phone] Fax: [phone]  Alvera Singh 01/27/2017, 3:49 PM  [facility] [address], [place], [place], [address] Phone: [phone]   Fax:  [phone]  Name: [name] MRN: [id number] Date of Birth: [birth year]

## 2017-01-29 ENCOUNTER — Encounter: Payer: PPO | Admitting: Physical Therapy

## 2017-01-29 ENCOUNTER — Encounter: Payer: Self-pay | Admitting: Family Medicine

## 2017-01-29 ENCOUNTER — Ambulatory Visit (INDEPENDENT_AMBULATORY_CARE_PROVIDER_SITE_OTHER): Payer: PPO | Admitting: Family Medicine

## 2017-01-29 VITALS — BP 140/66 | HR 61 | Temp 98.4°F | Ht 68.0 in | Wt 251.2 lb

## 2017-01-29 DIAGNOSIS — F319 Bipolar disorder, unspecified: Secondary | ICD-10-CM | POA: Diagnosis not present

## 2017-01-29 DIAGNOSIS — E785 Hyperlipidemia, unspecified: Secondary | ICD-10-CM | POA: Diagnosis not present

## 2017-01-29 DIAGNOSIS — N289 Disorder of kidney and ureter, unspecified: Secondary | ICD-10-CM | POA: Diagnosis not present

## 2017-01-29 DIAGNOSIS — I10 Essential (primary) hypertension: Secondary | ICD-10-CM

## 2017-01-29 DIAGNOSIS — Z23 Encounter for immunization: Secondary | ICD-10-CM | POA: Diagnosis not present

## 2017-01-29 DIAGNOSIS — M109 Gout, unspecified: Secondary | ICD-10-CM | POA: Diagnosis not present

## 2017-01-29 DIAGNOSIS — E782 Mixed hyperlipidemia: Secondary | ICD-10-CM

## 2017-01-29 DIAGNOSIS — E1169 Type 2 diabetes mellitus with other specified complication: Secondary | ICD-10-CM | POA: Diagnosis not present

## 2017-01-29 DIAGNOSIS — I4891 Unspecified atrial fibrillation: Secondary | ICD-10-CM | POA: Diagnosis not present

## 2017-01-29 LAB — LIPID PANEL
Cholesterol: 189 mg/dL (ref 0–200)
HDL: 50 mg/dL (ref >39.00)
NonHDL: 139.1
Total CHOL/HDL Ratio: 4
Triglycerides: 346 mg/dL — ABNORMAL HIGH (ref 0.0–149.0)
VLDL: 69.2 mg/dL — ABNORMAL HIGH (ref 0.0–40.0)

## 2017-01-29 LAB — LDL CHOLESTEROL, DIRECT: Direct LDL: 84 mg/dL

## 2017-01-29 LAB — COMPREHENSIVE METABOLIC PANEL
ALT: 32 U/L (ref 0–35)
AST: 35 U/L (ref 0–37)
Albumin: 4.7 g/dL (ref 3.5–5.2)
Alkaline Phosphatase: 78 U/L (ref 39–117)
BUN: 17 mg/dL (ref 6–23)
CO2: 26 mEq/L (ref 19–32)
Calcium: 10 mg/dL (ref 8.4–10.5)
Chloride: 101 mEq/L (ref 96–112)
Creatinine, Ser: 1.06 mg/dL (ref 0.40–1.20)
GFR: 55.1 mL/min — ABNORMAL LOW (ref >60.00)
Glucose, Bld: 120 mg/dL — ABNORMAL HIGH (ref 70–99)
Potassium: 4.3 mEq/L (ref 3.5–5.1)
Sodium: 139 mEq/L (ref 135–145)
Total Bilirubin: 0.5 mg/dL (ref 0.2–1.2)
Total Protein: 7.9 g/dL (ref 6.0–8.3)

## 2017-01-29 LAB — CBC
HCT: 44.8 % (ref 36.0–46.0)
Hemoglobin: 14.7 g/dL (ref 12.0–15.0)
MCHC: 32.8 g/dL (ref 30.0–36.0)
MCV: 90.9 fl (ref 78.0–100.0)
Platelets: 233 10*3/uL (ref 150.0–400.0)
RBC: 4.93 Mil/uL (ref 3.87–5.11)
RDW: 13.4 % (ref 11.5–15.5)
WBC: 8.3 10*3/uL (ref 4.0–10.5)

## 2017-01-29 LAB — HEMOGLOBIN A1C: Hgb A1c MFr Bld: 6.9 % — ABNORMAL HIGH (ref 4.6–6.5)

## 2017-01-29 LAB — URIC ACID: Uric Acid, Serum: 8.8 mg/dL — ABNORMAL HIGH (ref 2.4–7.0)

## 2017-01-29 LAB — TSH: TSH: 2.53 u[IU]/mL (ref 0.35–4.50)

## 2017-01-29 MED ORDER — HYDROCODONE-ACETAMINOPHEN 5-325 MG PO TABS
1.0000 | ORAL_TABLET | Freq: Four times a day (QID) | ORAL | Status: DC | PRN
Start: 2017-01-29 — End: 2017-01-29

## 2017-01-29 MED ORDER — HYDROCODONE-ACETAMINOPHEN 5-325 MG PO TABS: 1.0000 | ORAL_TABLET | Freq: Four times a day (QID) | ORAL | 0 refills | Status: DC | PRN

## 2017-01-29 NOTE — Patient Instructions (Signed)
Move Metoprolol to morning Check blood sugars in am and in pm for next 2 weeks. If am sugars significantly below 90 then add a high protein evening snack If continues to feel light headed, off balance then drop the Amlodipine in 1/2 by taking 1/2 a tab.  Hypertension Hypertension, commonly called high blood pressure, is when the force of blood pumping through the arteries is too strong. The arteries are the blood vessels that carry blood from the heart throughout the body. Hypertension forces the heart to work harder to pump blood and may cause arteries to become narrow or stiff. Having untreated or uncontrolled hypertension can cause heart attacks, strokes, kidney disease, and other problems. A blood pressure reading consists of a higher number over a lower number. Ideally, your blood pressure should be below 120/80. The first ("top") number is called the systolic pressure. It is a measure of the pressure in your arteries as your heart beats. The second ("bottom") number is called the diastolic pressure. It is a measure of the pressure in your arteries as the heart relaxes. What are the causes? The cause of this condition is not known. What increases the risk? Some risk factors for high blood pressure are under your control. Others are not. Factors you can change  Smoking.  Having type 2 diabetes mellitus, high cholesterol, or both.  Not getting enough exercise or physical activity.  Being overweight.  Having too much fat, sugar, calories, or salt (sodium) in your diet.  Drinking too much alcohol. Factors that are difficult or impossible to change  Having chronic kidney disease.  Having a family history of high blood pressure.  Age. Risk increases with age.  Race. You may be at higher risk if you are African-American.  Gender. Men are at higher risk than women before age 79. After age 43, women are at higher risk than men.  Having obstructive sleep apnea.  Stress. What are the  signs or symptoms? Extremely high blood pressure (hypertensive crisis) may cause:  Headache.  Anxiety.  Shortness of breath.  Nosebleed.  Nausea and vomiting.  Severe chest pain.  Jerky movements you cannot control (seizures).  How is this diagnosed? This condition is diagnosed by measuring your blood pressure while you are seated, with your arm resting on a surface. The cuff of the blood pressure monitor will be placed directly against the skin of your upper arm at the level of your heart. It should be measured at least twice using the same arm. Certain conditions can cause a difference in blood pressure between your right and left arms. Certain factors can cause blood pressure readings to be lower or higher than normal (elevated) for a short period of time:  When your blood pressure is higher when you are in a health care provider's office than when you are at home, this is called white coat hypertension. Most people with this condition do not need medicines.  When your blood pressure is higher at home than when you are in a health care provider's office, this is called masked hypertension. Most people with this condition may need medicines to control blood pressure.  If you have a high blood pressure reading during one visit or you have normal blood pressure with other risk factors:  You may be asked to return on a different day to have your blood pressure checked again.  You may be asked to monitor your blood pressure at home for 1 week or longer.  If you are diagnosed  with hypertension, you may have other blood or imaging tests to help your health care provider understand your overall risk for other conditions. How is this treated? This condition is treated by making healthy lifestyle changes, such as eating healthy foods, exercising more, and reducing your alcohol intake. Your health care provider may prescribe medicine if lifestyle changes are not enough to get your blood  pressure under control, and if:  Your systolic blood pressure is above 130.  Your diastolic blood pressure is above 80.  Your personal target blood pressure may vary depending on your medical conditions, your age, and other factors. Follow these instructions at home: Eating and drinking  Eat a diet that is high in fiber and potassium, and low in sodium, added sugar, and fat. An example eating plan is called the DASH (Dietary Approaches to Stop Hypertension) diet. To eat this way: ? Eat plenty of fresh fruits and vegetables. Try to fill half of your plate at each meal with fruits and vegetables. ? Eat whole grains, such as whole wheat pasta, brown rice, or whole grain bread. Fill about one quarter of your plate with whole grains. ? Eat or drink low-fat dairy products, such as skim milk or low-fat yogurt. ? Avoid fatty cuts of meat, processed or cured meats, and poultry with skin. Fill about one quarter of your plate with lean proteins, such as fish, chicken without skin, beans, eggs, and tofu. ? Avoid premade and processed foods. These tend to be higher in sodium, added sugar, and fat.  Reduce your daily sodium intake. Most people with hypertension should eat less than 1,500 mg of sodium a day.  Limit alcohol intake to no more than 1 drink a day for nonpregnant women and 2 drinks a day for men. One drink equals 12 oz of beer, 5 oz of wine, or 1 oz of hard liquor. Lifestyle  Work with your health care provider to maintain a healthy body weight or to lose weight. Ask what an ideal weight is for you.  Get at least 30 minutes of exercise that causes your heart to beat faster (aerobic exercise) most days of the week. Activities may include walking, swimming, or biking.  Include exercise to strengthen your muscles (resistance exercise), such as pilates or lifting weights, as part of your weekly exercise routine. Try to do these types of exercises for 30 minutes at least 3 days a week.  Do not  use any products that contain nicotine or tobacco, such as cigarettes and e-cigarettes. If you need help quitting, ask your health care provider.  Monitor your blood pressure at home as told by your health care provider.  Keep all follow-up visits as told by your health care provider. This is important. Medicines  Take over-the-counter and prescription medicines only as told by your health care provider. Follow directions carefully. Blood pressure medicines must be taken as prescribed.  Do not skip doses of blood pressure medicine. Doing this puts you at risk for problems and can make the medicine less effective.  Ask your health care provider about side effects or reactions to medicines that you should watch for. Contact a health care provider if:  You think you are having a reaction to a medicine you are taking.  You have headaches that keep coming back (recurring).  You feel dizzy.  You have swelling in your ankles.  You have trouble with your vision. Get help right away if:  You develop a severe headache or confusion.  You  have unusual weakness or numbness.  You feel faint.  You have severe pain in your chest or abdomen.  You vomit repeatedly.  You have trouble breathing. Summary  Hypertension is when the force of blood pumping through your arteries is too strong. If this condition is not controlled, it may put you at risk for serious complications.  Your personal target blood pressure may vary depending on your medical conditions, your age, and other factors. For most people, a normal blood pressure is less than 120/80.  Hypertension is treated with lifestyle changes, medicines, or a combination of both. Lifestyle changes include weight loss, eating a healthy, low-sodium diet, exercising more, and limiting alcohol. This information is not intended to replace advice given to you by your health care provider. Make sure you discuss any questions you have with your health  care provider. Document Released: 05/12/2005 Document Revised: 04/09/2016 Document Reviewed: 04/09/2016 Elsevier Interactive Patient Education  [employer].

## 2017-01-29 NOTE — Assessment & Plan Note (Signed)
Well controlled, no changes to meds. Encouraged heart healthy diet such as the DASH diet and exercise as tolerated.  °

## 2017-01-29 NOTE — Assessment & Plan Note (Signed)
Check uric acid. 

## 2017-01-29 NOTE — Assessment & Plan Note (Signed)
Has an appointment with psychiatry this week.

## 2017-01-29 NOTE — Assessment & Plan Note (Addendum)
hgba1c acceptable, minimize simple carbs. Increase exercise as tolerated.  

## 2017-01-30 ENCOUNTER — Ambulatory Visit (INDEPENDENT_AMBULATORY_CARE_PROVIDER_SITE_OTHER): Payer: PPO | Admitting: *Deleted

## 2017-01-30 DIAGNOSIS — F39 Unspecified mood [affective] disorder: Secondary | ICD-10-CM | POA: Diagnosis not present

## 2017-01-30 DIAGNOSIS — I4891 Unspecified atrial fibrillation: Secondary | ICD-10-CM

## 2017-01-30 DIAGNOSIS — Z5181 Encounter for therapeutic drug level monitoring: Secondary | ICD-10-CM | POA: Diagnosis not present

## 2017-01-30 DIAGNOSIS — Z952 Presence of prosthetic heart valve: Secondary | ICD-10-CM | POA: Diagnosis not present

## 2017-01-30 LAB — POCT INR: INR: 1.7

## 2017-02-01 NOTE — Progress Notes (Signed)
Patient ID: [name], female   DOB: 16-Jan-1951, 66 y.o.   MRN: [id number]   Subjective:    Patient ID: [name], female    DOB: 05/23/1951, 66 y.o.   MRN: [id number]  Chief Complaint  Patient presents with  . Dizziness    Patient is here today C/O dizziness which started about 2 months ago.     HPI Patient is in today for follow up. She is getting ready to head to [place] to visit her family. She is mostly feeling well but she notes some occasional episodes of feeling light headed off and on over the past few months. No associated symptoms or falls. Denies CP/palp/SOB/HA/congestion/fevers/GI or GU c/o. Taking meds as prescribed  Past Medical History:  Diagnosis Date  . ANEMIA 05/28/2010  . AORTIC VALVE REPLACEMENT, HX OF 03/11/2010   Mechanical prosthesis  . BIPOLAR AFFECTIVE DISORDER 03/11/2010  . Depression   . Diverticulitis 12/18/2010  . FIBROIDS, UTERUS 03/11/2010  . Gout 09/04/2016  . Grave's disease 8-12  . Hyperlipidemia associated with type 2 diabetes mellitus ([place]) 06/05/2016  . Hypertension   . Migraine 06/05/2016  . Mixed hyperlipidemia 03/11/2010  . Obesity   . UTI (lower urinary tract infection) 08/14/2011    Past Surgical History:  Procedure Laterality Date  . ABDOMINAL HYSTERECTOMY  ?1998   sb/l spo, total for fibroids/heavy bleeding  . AORTIC VALVE REPLACEMENT  2011   Mechanical prosthesis, [employer]  . APPENDECTOMY     Required revision for EColi infection, required recurrent packing  . bowel obstruction     Requiring adhesions to be removed  . CHOLECYSTECTOMY    . COLONOSCOPY WITH PROPOFOL N/A 11/01/2015   Procedure: COLONOSCOPY WITH PROPOFOL;  Surgeon: Milus Banister, MD;  Location: [facility];  Service: Endoscopy;  Laterality: N/A;  . CYSTOCELE REPAIR N/A 10/16/2015   Procedure: ANTERIOR REPAIR (CYSTOCELE);  Surgeon: Donnamae Jude, MD;  Location: [facility];  Service: Gynecology;  Laterality: N/A;  . HERNIA REPAIR  08-16-10  . LEFT HEART CATHETERIZATION  WITH CORONARY ANGIOGRAM N/A 12/05/2011   Procedure: LEFT HEART CATHETERIZATION WITH CORONARY ANGIOGRAM;  Surgeon: Sinclair Grooms, MD;  Location: [facility] CATH LAB;  Service: Cardiovascular;  Laterality: N/A;  . TOTAL KNEE ARTHROPLASTY Right 02/24/2013   Procedure: RIGHT TOTAL KNEE ARTHROPLASTY;  Surgeon: Johnn Hai, MD;  Location: [facility];  Service: Orthopedics;  Laterality: Right;  . TUBAL LIGATION    . varicose vein surgery b/l legs      Family History  Problem Relation Age of Onset  . Scoliosis Mother   . Arthritis Mother        Rheumatoid  . Aneurysm Mother        heart  . Alcohol abuse Father   . Depression Sister   . Depression Brother   . Alcohol abuse Brother   . Cancer Maternal Grandmother        colon  . Diabetes Maternal Grandfather   . Heart disease Maternal Grandfather   . Alcohol abuse Maternal Grandfather   . Depression Paternal Grandmother   . Diabetes Paternal Grandmother   . Depression Paternal Grandfather   . Diabetes Paternal Grandfather   . Depression Sister   . Alcohol abuse Brother   . Depression Brother   . Heart disease Brother     Social History   Social History  . Marital status: Married    Spouse name: N/A  . Number of children: N/A  . Years of  education: N/A   Occupational History  . Not on file.   Social History Main Topics  . Smoking status: Former Smoker    Quit date: 05/26/1990  . Smokeless tobacco: Never Used  . Alcohol use 0.0 oz/week     Comment: rare  . Drug use: No  . Sexual activity: No   Other Topics Concern  . Not on file   Social History Narrative  . No narrative on file    Outpatient Medications Prior to Visit  Medication Sig Dispense Refill  . acetaminophen (TYLENOL) 500 MG tablet Take 1,000-1,500 mg by mouth every 6 (six) hours as needed (For knee pain.).    Marland Kitchen aspirin EC 81 MG tablet Take 81 mg by mouth daily.    . busPIRone (BUSPAR) 15 MG tablet     . clonazePAM (KLONOPIN) 0.5 MG tablet Take 0.5 mg by mouth 2  (two) times daily.     . colesevelam (WELCHOL) 625 MG tablet Take 3 tablets (1,875 mg total) by mouth 2 (two) times daily with a meal. 180 tablet 3  . cyclobenzaprine (FLEXERIL) 10 MG tablet Take 10 mg by mouth 3 (three) times daily as needed for muscle spasms.    . Febuxostat (ULORIC) 80 MG TABS Take 1 tablet by mouth daily.    . fenofibrate 160 MG tablet Take 160 mg by mouth daily.    . furosemide (LASIX) 40 MG tablet TAKE ONE TABLET BY MOUTH DAILY 30 tablet 9  . glucose blood test strip Use once daily to check blood sugar.  DX E11.9 100 each 3  . lamoTRIgine (LAMICTAL) 200 MG tablet Take 300 mg by mouth daily. Pt taking 1 and 1/2 tablet daily    . metoprolol succinate (TOPROL-XL) 25 MG 24 hr tablet TAKE ONE TABLET BY MOUTH EVERY MORNING 30 tablet 0  . ONE TOUCH LANCETS MISC Test once daily to check blood sugar. DX E11.9 100 each 3  . pantoprazole (PROTONIX) 40 MG tablet TAKE ONE TABLET BY MOUTH DAILY 30 tablet 3  . Polyethyl Glycol-Propyl Glycol (SYSTANE OP) Place 1 drop into both eyes at bedtime as needed (For dry eyes.).     Marland Kitchen Probiotic Product (ALIGN) 4 MG CAPS Take 4 mg by mouth daily.    . rosuvastatin (CRESTOR) 40 MG tablet TAKE 1 TABLET (40 MG TOTAL) BY MOUTH DAILY. 90 tablet 4  . SEROQUEL XR 400 MG 24 hr tablet Take 400 mg by mouth at bedtime.   1  . SUMAtriptan (IMITREX) 50 MG tablet TAKE ONE TABLET EVERY 2 HOURS AS NEEDED FOR MIGRAINE. MAY REPEAT IN 2 HOURS IF HEADACHE PERSISTS OR RECURS. (MAX OF 2 TABLETS IN 24 HOURS) 10 tablet 0  . tiZANidine (ZANAFLEX) 2 MG tablet Take 1 tablet (2 mg total) by mouth every 8 (eight) hours as needed for muscle spasms. 40 tablet 2  . traMADol (ULTRAM) 50 MG tablet Take 1 tablet (50 mg total) by mouth every 8 (eight) hours as needed. Severe pain 30 tablet 0  . venlafaxine XR (EFFEXOR-XR) 150 MG 24 hr capsule Take 150 mg by mouth every morning.    . warfarin (COUMADIN) 5 MG tablet TAKE AS DIRECED BY [facility] 30 tablet 3  . amLODipine (NORVASC)  5 MG tablet Take 1 tablet (5 mg total) by mouth daily. 90 tablet 3   No facility-administered medications prior to visit.     Allergies  Allergen Reactions  . Mucinex [Guaifenesin Er] Other (See Comments)    Severe Headaches  .  Keflex [Cephalexin] Other (See Comments)    Headaches,Dizziness  . Sulfonamide Derivatives Other (See Comments)    headaches    Review of Systems  Constitutional: Positive for malaise/fatigue. Negative for fever.  HENT: Negative for congestion.   Eyes: Negative for blurred vision.  Respiratory: Negative for shortness of breath.   Cardiovascular: Negative for chest pain, palpitations and leg swelling.  Gastrointestinal: Negative for abdominal pain, blood in stool and nausea.  Genitourinary: Negative for dysuria and frequency.  Musculoskeletal: Positive for back pain and joint pain. Negative for falls.  Skin: Negative for rash.  Neurological: Negative for dizziness, loss of consciousness and headaches.  Endo/Heme/Allergies: Negative for environmental allergies.  Psychiatric/Behavioral: Negative for depression. The patient is nervous/anxious.        Objective:    Physical Exam  Constitutional: She is oriented to person, place, and time. She appears well-developed and well-nourished. No distress.  HENT:  Head: Normocephalic and atraumatic.  Nose: Nose normal.  Eyes: Right eye exhibits no discharge. Left eye exhibits no discharge.  Neck: Normal range of motion. Neck supple.  Cardiovascular: Normal rate.   No murmur heard. Irregularly irregular.   Pulmonary/Chest: Effort normal and breath sounds normal.  Abdominal: Soft. Bowel sounds are normal. There is no tenderness.  Musculoskeletal: She exhibits no edema.  Neurological: She is alert and oriented to person, place, and time.  Skin: Skin is warm and dry.  Psychiatric: She has a normal mood and affect.  Nursing note and vitals reviewed.   BP 140/66 (BP Location: Left Arm, Patient Position:  Sitting, Cuff Size: Large)   Pulse 61   Temp 98.4 F (36.9 C) (Oral)   Ht 5\' 8"  (1.727 m)   Wt 251 lb 3.2 oz (113.9 kg)   SpO2 97%   BMI 38.19 kg/m  Wt Readings from Last 3 Encounters:  01/29/17 251 lb 3.2 oz (113.9 kg)  09/15/16 255 lb (115.7 kg)  06/05/16 254 lb 6.4 oz (115.4 kg)     Lab Results  Component Value Date   WBC 8.3 01/29/2017   HGB 14.7 01/29/2017   HCT 44.8 01/29/2017   PLT 233.0 01/29/2017   GLUCOSE 120 (H) 01/29/2017   CHOL 189 01/29/2017   TRIG 346.0 (H) 01/29/2017   HDL 50.00 01/29/2017   LDLDIRECT 84.0 01/29/2017   LDLCALC 71 04/04/2013   ALT 32 01/29/2017   AST 35 01/29/2017   NA 139 01/29/2017   K 4.3 01/29/2017   CL 101 01/29/2017   CREATININE 1.06 01/29/2017   BUN 17 01/29/2017   CO2 26 01/29/2017   TSH 2.53 01/29/2017   INR 1.7 01/30/2017   HGBA1C 6.9 (H) 01/29/2017    Lab Results  Component Value Date   TSH 2.53 01/29/2017   Lab Results  Component Value Date   WBC 8.3 01/29/2017   HGB 14.7 01/29/2017   HCT 44.8 01/29/2017   MCV 90.9 01/29/2017   PLT 233.0 01/29/2017   Lab Results  Component Value Date   NA 139 01/29/2017   K 4.3 01/29/2017   CO2 26 01/29/2017   GLUCOSE 120 (H) 01/29/2017   BUN 17 01/29/2017   CREATININE 1.06 01/29/2017   BILITOT 0.5 01/29/2017   ALKPHOS 78 01/29/2017   AST 35 01/29/2017   ALT 32 01/29/2017   PROT 7.9 01/29/2017   ALBUMIN 4.7 01/29/2017   CALCIUM 10.0 01/29/2017   ANIONGAP 7 11/30/2015   GFR 55.10 (L) 01/29/2017   Lab Results  Component Value Date   CHOL 189 01/29/2017  Lab Results  Component Value Date   HDL 50.00 01/29/2017   Lab Results  Component Value Date   LDLCALC 71 04/04/2013   Lab Results  Component Value Date   TRIG 346.0 (H) 01/29/2017   Lab Results  Component Value Date   CHOLHDL 4 01/29/2017   Lab Results  Component Value Date   HGBA1C 6.9 (H) 01/29/2017       Assessment & Plan:   Problem List Items Addressed This Visit    MIXED HYPERLIPIDEMIA      Encouraged heart healthy diet, increase exercise, avoid trans fats, consider a krill oil cap daily      Bipolar disorder ([place])    Has an appointment with psychiatry this week.      Renal insufficiency   Atrial fibrillation (HCC)    Rate controlled and tolerating Coumadin      Essential hypertension, benign    Well controlled, no changes to meds. Encouraged heart healthy diet such as the DASH diet and exercise as tolerated.       Relevant Orders   CBC (Completed)   Comprehensive metabolic panel (Completed)   TSH (Completed)   Hyperlipidemia associated with type 2 diabetes mellitus (HCC)    hgba1c acceptable, minimize simple carbs. Increase exercise as tolerated.      Relevant Orders   Hemoglobin A1c (Completed)   Lipid panel (Completed)   Gout - Primary    Check uric acid      Relevant Orders   Uric acid (Completed)    Other Visit Diagnoses    Encounter for immunization       Relevant Orders   Flu vaccine HIGH DOSE PF (Completed)      I have changed [name]'s HYDROcodone-acetaminophen. I am also having her maintain her clonazePAM, glucose blood, ONE TOUCH LANCETS, SEROQUEL XR, aspirin EC, cyclobenzaprine, fenofibrate, ALIGN, Polyethyl Glycol-Propyl Glycol (SYSTANE OP), venlafaxine XR, acetaminophen, lamoTRIgine, busPIRone, colesevelam, Febuxostat, amLODipine, rosuvastatin, warfarin, pantoprazole, traMADol, furosemide, tiZANidine, SUMAtriptan, and metoprolol succinate.  Meds ordered this encounter  Medications  . DISCONTD: HYDROcodone-acetaminophen (NORCO/VICODIN) 5-325 MG per tablet 1 tablet  . HYDROcodone-acetaminophen (NORCO) 5-325 MG tablet    Sig: Take 1 tablet by mouth every 6 (six) hours as needed for moderate pain.    Dispense:  10 tablet    Refill:  0     Penni Homans, MD

## 2017-02-01 NOTE — Assessment & Plan Note (Signed)
Rate controlled and tolerating Coumadin 

## 2017-02-01 NOTE — Assessment & Plan Note (Signed)
Encouraged heart healthy diet, increase exercise, avoid trans fats, consider a krill oil cap daily 

## 2017-02-02 ENCOUNTER — Encounter: Payer: PPO | Admitting: Physical Therapy

## 2017-02-05 ENCOUNTER — Encounter: Payer: PPO | Admitting: Physical Therapy

## 2017-02-09 ENCOUNTER — Encounter: Payer: PPO | Admitting: Physical Therapy

## 2017-02-10 ENCOUNTER — Other Ambulatory Visit: Payer: Self-pay

## 2017-02-10 MED ORDER — GLUCOSE BLOOD VI STRP: ORAL_STRIP | 12 refills | Status: DC

## 2017-02-12 ENCOUNTER — Other Ambulatory Visit: Payer: Self-pay | Admitting: Family Medicine

## 2017-02-12 ENCOUNTER — Encounter: Payer: PPO | Admitting: Physical Therapy

## 2017-02-13 DIAGNOSIS — Z5181 Encounter for therapeutic drug level monitoring: Secondary | ICD-10-CM | POA: Diagnosis not present

## 2017-02-13 DIAGNOSIS — I359 Nonrheumatic aortic valve disorder, unspecified: Secondary | ICD-10-CM | POA: Diagnosis not present

## 2017-02-13 DIAGNOSIS — I4891 Unspecified atrial fibrillation: Secondary | ICD-10-CM | POA: Diagnosis not present

## 2017-02-13 LAB — PROTIME-INR: INR: 3 — AB (ref 0.9–1.1)

## 2017-02-16 ENCOUNTER — Ambulatory Visit (INDEPENDENT_AMBULATORY_CARE_PROVIDER_SITE_OTHER): Payer: PPO | Admitting: Internal Medicine

## 2017-02-16 DIAGNOSIS — I4891 Unspecified atrial fibrillation: Secondary | ICD-10-CM | POA: Diagnosis not present

## 2017-02-16 DIAGNOSIS — Z5181 Encounter for therapeutic drug level monitoring: Secondary | ICD-10-CM

## 2017-02-16 DIAGNOSIS — Z952 Presence of prosthetic heart valve: Secondary | ICD-10-CM

## 2017-02-17 ENCOUNTER — Encounter: Payer: PPO | Admitting: Physical Therapy

## 2017-02-20 ENCOUNTER — Encounter: Payer: PPO | Admitting: Rehabilitation

## 2017-02-23 ENCOUNTER — Encounter: Payer: PPO | Admitting: Physical Therapy

## 2017-02-24 DIAGNOSIS — M5136 Other intervertebral disc degeneration, lumbar region: Secondary | ICD-10-CM | POA: Diagnosis not present

## 2017-02-24 DIAGNOSIS — Y92010 Kitchen of single-family (private) house as the place of occurrence of the external cause: Secondary | ICD-10-CM | POA: Diagnosis not present

## 2017-02-24 DIAGNOSIS — S300XXA Contusion of lower back and pelvis, initial encounter: Secondary | ICD-10-CM | POA: Diagnosis not present

## 2017-02-24 DIAGNOSIS — M25559 Pain in unspecified hip: Secondary | ICD-10-CM | POA: Diagnosis not present

## 2017-02-24 DIAGNOSIS — I10 Essential (primary) hypertension: Secondary | ICD-10-CM | POA: Diagnosis not present

## 2017-02-24 DIAGNOSIS — M47816 Spondylosis without myelopathy or radiculopathy, lumbar region: Secondary | ICD-10-CM | POA: Diagnosis not present

## 2017-02-24 DIAGNOSIS — W010XXA Fall on same level from slipping, tripping and stumbling without subsequent striking against object, initial encounter: Secondary | ICD-10-CM | POA: Diagnosis not present

## 2017-02-24 DIAGNOSIS — Z8679 Personal history of other diseases of the circulatory system: Secondary | ICD-10-CM | POA: Diagnosis not present

## 2017-02-24 DIAGNOSIS — Z8659 Personal history of other mental and behavioral disorders: Secondary | ICD-10-CM | POA: Diagnosis not present

## 2017-02-24 DIAGNOSIS — E7800 Pure hypercholesterolemia, unspecified: Secondary | ICD-10-CM | POA: Diagnosis not present

## 2017-02-24 DIAGNOSIS — M533 Sacrococcygeal disorders, not elsewhere classified: Secondary | ICD-10-CM | POA: Diagnosis not present

## 2017-02-24 DIAGNOSIS — Z7901 Long term (current) use of anticoagulants: Secondary | ICD-10-CM | POA: Diagnosis not present

## 2017-02-24 DIAGNOSIS — M160 Bilateral primary osteoarthritis of hip: Secondary | ICD-10-CM | POA: Diagnosis not present

## 2017-02-27 ENCOUNTER — Encounter: Payer: PPO | Admitting: Physical Therapy

## 2017-02-28 DIAGNOSIS — Z7901 Long term (current) use of anticoagulants: Secondary | ICD-10-CM | POA: Diagnosis not present

## 2017-02-28 DIAGNOSIS — Z87891 Personal history of nicotine dependence: Secondary | ICD-10-CM | POA: Diagnosis not present

## 2017-02-28 DIAGNOSIS — S3993XA Unspecified injury of pelvis, initial encounter: Secondary | ICD-10-CM | POA: Diagnosis not present

## 2017-02-28 DIAGNOSIS — I10 Essential (primary) hypertension: Secondary | ICD-10-CM | POA: Diagnosis not present

## 2017-02-28 DIAGNOSIS — M533 Sacrococcygeal disorders, not elsewhere classified: Secondary | ICD-10-CM | POA: Diagnosis not present

## 2017-02-28 DIAGNOSIS — M545 Low back pain: Secondary | ICD-10-CM | POA: Diagnosis not present

## 2017-02-28 DIAGNOSIS — E785 Hyperlipidemia, unspecified: Secondary | ICD-10-CM | POA: Diagnosis not present

## 2017-02-28 DIAGNOSIS — M48061 Spinal stenosis, lumbar region without neurogenic claudication: Secondary | ICD-10-CM | POA: Diagnosis not present

## 2017-02-28 DIAGNOSIS — M549 Dorsalgia, unspecified: Secondary | ICD-10-CM | POA: Diagnosis not present

## 2017-02-28 DIAGNOSIS — W19XXXA Unspecified fall, initial encounter: Secondary | ICD-10-CM | POA: Diagnosis not present

## 2017-03-10 LAB — PROTIME-INR: INR: 4.7 — AB (ref 0.9–1.1)

## 2017-03-11 ENCOUNTER — Ambulatory Visit: Payer: Self-pay | Admitting: Interventional Cardiology

## 2017-03-11 DIAGNOSIS — Z952 Presence of prosthetic heart valve: Secondary | ICD-10-CM

## 2017-03-11 DIAGNOSIS — Z5181 Encounter for therapeutic drug level monitoring: Secondary | ICD-10-CM

## 2017-03-11 DIAGNOSIS — I4891 Unspecified atrial fibrillation: Secondary | ICD-10-CM

## 2017-03-19 ENCOUNTER — Other Ambulatory Visit: Payer: Self-pay | Admitting: Interventional Cardiology

## 2017-03-23 ENCOUNTER — Ambulatory Visit (INDEPENDENT_AMBULATORY_CARE_PROVIDER_SITE_OTHER): Payer: PPO | Admitting: *Deleted

## 2017-03-23 DIAGNOSIS — Z952 Presence of prosthetic heart valve: Secondary | ICD-10-CM

## 2017-03-23 DIAGNOSIS — Z5181 Encounter for therapeutic drug level monitoring: Secondary | ICD-10-CM

## 2017-03-23 DIAGNOSIS — I4891 Unspecified atrial fibrillation: Secondary | ICD-10-CM | POA: Diagnosis not present

## 2017-03-23 LAB — POCT INR: INR: 8

## 2017-03-23 LAB — PROTIME-INR
INR: 6.9 (ref 0.8–1.2)
Prothrombin Time: 72.7 s — ABNORMAL HIGH (ref 9.1–12.0)

## 2017-03-27 ENCOUNTER — Ambulatory Visit (INDEPENDENT_AMBULATORY_CARE_PROVIDER_SITE_OTHER): Payer: PPO | Admitting: *Deleted

## 2017-03-27 DIAGNOSIS — F39 Unspecified mood [affective] disorder: Secondary | ICD-10-CM | POA: Diagnosis not present

## 2017-03-27 DIAGNOSIS — Z5181 Encounter for therapeutic drug level monitoring: Secondary | ICD-10-CM

## 2017-03-27 DIAGNOSIS — I4891 Unspecified atrial fibrillation: Secondary | ICD-10-CM | POA: Diagnosis not present

## 2017-03-27 DIAGNOSIS — Z952 Presence of prosthetic heart valve: Secondary | ICD-10-CM | POA: Diagnosis not present

## 2017-03-27 LAB — POCT INR: INR: 1.8

## 2017-03-31 DIAGNOSIS — H2513 Age-related nuclear cataract, bilateral: Secondary | ICD-10-CM | POA: Diagnosis not present

## 2017-04-08 ENCOUNTER — Ambulatory Visit (INDEPENDENT_AMBULATORY_CARE_PROVIDER_SITE_OTHER): Payer: PPO | Admitting: *Deleted

## 2017-04-08 DIAGNOSIS — Z952 Presence of prosthetic heart valve: Secondary | ICD-10-CM | POA: Diagnosis not present

## 2017-04-08 DIAGNOSIS — I4891 Unspecified atrial fibrillation: Secondary | ICD-10-CM

## 2017-04-08 DIAGNOSIS — Z5181 Encounter for therapeutic drug level monitoring: Secondary | ICD-10-CM | POA: Diagnosis not present

## 2017-04-08 LAB — PROTIME-INR
INR: 5.8 — ABNORMAL HIGH (ref 0.8–1.2)
Prothrombin Time: 62 s — ABNORMAL HIGH (ref 9.1–12.0)

## 2017-04-08 LAB — POCT INR: INR: 6.1

## 2017-04-15 ENCOUNTER — Ambulatory Visit (INDEPENDENT_AMBULATORY_CARE_PROVIDER_SITE_OTHER): Payer: PPO | Admitting: *Deleted

## 2017-04-15 DIAGNOSIS — I4891 Unspecified atrial fibrillation: Secondary | ICD-10-CM

## 2017-04-15 DIAGNOSIS — Z5181 Encounter for therapeutic drug level monitoring: Secondary | ICD-10-CM

## 2017-04-15 DIAGNOSIS — Z952 Presence of prosthetic heart valve: Secondary | ICD-10-CM | POA: Diagnosis not present

## 2017-04-15 LAB — POCT INR: INR: 2.7

## 2017-04-15 NOTE — Patient Instructions (Signed)
Continue taking 1/2 tablet daily except 1 tablet on Tuesdays and Saturdays.  Recheck INR in 2 weeks.

## 2017-04-29 ENCOUNTER — Ambulatory Visit (INDEPENDENT_AMBULATORY_CARE_PROVIDER_SITE_OTHER): Payer: PPO | Admitting: *Deleted

## 2017-04-29 DIAGNOSIS — Z952 Presence of prosthetic heart valve: Secondary | ICD-10-CM | POA: Diagnosis not present

## 2017-04-29 DIAGNOSIS — I4891 Unspecified atrial fibrillation: Secondary | ICD-10-CM

## 2017-04-29 DIAGNOSIS — Z5181 Encounter for therapeutic drug level monitoring: Secondary | ICD-10-CM

## 2017-04-29 LAB — POCT INR: INR: 3.2

## 2017-04-29 NOTE — Patient Instructions (Addendum)
Continue taking 1/2 tablet daily except 1 tablet on Tuesdays and Saturdays.  Recheck INR in 3 weeks.

## 2017-05-08 DIAGNOSIS — F39 Unspecified mood [affective] disorder: Secondary | ICD-10-CM | POA: Diagnosis not present

## 2017-05-14 ENCOUNTER — Other Ambulatory Visit: Payer: Self-pay | Admitting: Family Medicine

## 2017-05-15 ENCOUNTER — Other Ambulatory Visit: Payer: Self-pay | Admitting: Family Medicine

## 2017-05-21 ENCOUNTER — Ambulatory Visit (INDEPENDENT_AMBULATORY_CARE_PROVIDER_SITE_OTHER): Payer: PPO | Admitting: *Deleted

## 2017-05-21 DIAGNOSIS — Z5181 Encounter for therapeutic drug level monitoring: Secondary | ICD-10-CM | POA: Diagnosis not present

## 2017-05-21 DIAGNOSIS — I4891 Unspecified atrial fibrillation: Secondary | ICD-10-CM

## 2017-05-21 DIAGNOSIS — Z952 Presence of prosthetic heart valve: Secondary | ICD-10-CM

## 2017-05-21 LAB — POCT INR: INR: 3.5

## 2017-05-21 NOTE — Patient Instructions (Signed)
Description   Continue taking 1/2 tablet daily except 1 tablet on Tuesdays and Saturdays.  Recheck INR in 4 weeks.

## 2017-06-23 ENCOUNTER — Ambulatory Visit (INDEPENDENT_AMBULATORY_CARE_PROVIDER_SITE_OTHER): Payer: PPO | Admitting: *Deleted

## 2017-06-23 DIAGNOSIS — Z5181 Encounter for therapeutic drug level monitoring: Secondary | ICD-10-CM | POA: Diagnosis not present

## 2017-06-23 DIAGNOSIS — Z952 Presence of prosthetic heart valve: Secondary | ICD-10-CM

## 2017-06-23 DIAGNOSIS — I4891 Unspecified atrial fibrillation: Secondary | ICD-10-CM

## 2017-06-23 LAB — POCT INR: INR: 2.8

## 2017-06-23 NOTE — Patient Instructions (Signed)
Description   Continue taking 1/2 tablet daily except 1 tablet on Tuesdays and Saturdays.  Recheck INR in 4 weeks.

## 2017-07-11 ENCOUNTER — Other Ambulatory Visit: Payer: Self-pay | Admitting: Family Medicine

## 2017-07-20 ENCOUNTER — Other Ambulatory Visit: Payer: Self-pay | Admitting: Family Medicine

## 2017-07-21 ENCOUNTER — Ambulatory Visit (INDEPENDENT_AMBULATORY_CARE_PROVIDER_SITE_OTHER): Payer: PPO

## 2017-07-21 DIAGNOSIS — I4891 Unspecified atrial fibrillation: Secondary | ICD-10-CM

## 2017-07-21 DIAGNOSIS — Z952 Presence of prosthetic heart valve: Secondary | ICD-10-CM | POA: Diagnosis not present

## 2017-07-21 DIAGNOSIS — Z5181 Encounter for therapeutic drug level monitoring: Secondary | ICD-10-CM

## 2017-07-21 LAB — POCT INR: INR: 2.4

## 2017-07-21 NOTE — Patient Instructions (Signed)
Description   Take 1.5 tablets today, then resume same dosage 1/2 tablet daily except 1 tablet on Tuesdays and Saturdays.  Recheck INR in 4 weeks.

## 2017-07-31 DIAGNOSIS — F39 Unspecified mood [affective] disorder: Secondary | ICD-10-CM | POA: Diagnosis not present

## 2017-08-08 ENCOUNTER — Other Ambulatory Visit: Payer: Self-pay | Admitting: Family Medicine

## 2017-08-18 ENCOUNTER — Ambulatory Visit (INDEPENDENT_AMBULATORY_CARE_PROVIDER_SITE_OTHER): Payer: PPO | Admitting: *Deleted

## 2017-08-18 DIAGNOSIS — I4891 Unspecified atrial fibrillation: Secondary | ICD-10-CM

## 2017-08-18 DIAGNOSIS — Z5181 Encounter for therapeutic drug level monitoring: Secondary | ICD-10-CM | POA: Diagnosis not present

## 2017-08-18 DIAGNOSIS — Z952 Presence of prosthetic heart valve: Secondary | ICD-10-CM

## 2017-08-18 LAB — POCT INR: INR: 2.5

## 2017-08-18 NOTE — Patient Instructions (Signed)
Description   Continue  same dose of coumadin  1/2 tablet daily except 1 tablet on Tuesdays and Saturdays.  Recheck INR in 4 weeks.

## 2017-09-07 ENCOUNTER — Other Ambulatory Visit: Payer: Self-pay | Admitting: Family Medicine

## 2017-09-07 ENCOUNTER — Other Ambulatory Visit: Payer: Self-pay | Admitting: Interventional Cardiology

## 2017-09-14 DIAGNOSIS — F39 Unspecified mood [affective] disorder: Secondary | ICD-10-CM | POA: Diagnosis not present

## 2017-09-16 ENCOUNTER — Other Ambulatory Visit: Payer: Self-pay | Admitting: Interventional Cardiology

## 2017-09-16 ENCOUNTER — Ambulatory Visit (INDEPENDENT_AMBULATORY_CARE_PROVIDER_SITE_OTHER): Payer: PPO | Admitting: Interventional Cardiology

## 2017-09-16 ENCOUNTER — Ambulatory Visit (INDEPENDENT_AMBULATORY_CARE_PROVIDER_SITE_OTHER): Payer: PPO | Admitting: *Deleted

## 2017-09-16 ENCOUNTER — Encounter: Payer: Self-pay | Admitting: Interventional Cardiology

## 2017-09-16 VITALS — BP 124/72 | HR 69 | Ht 68.0 in | Wt 254.0 lb

## 2017-09-16 DIAGNOSIS — Z952 Presence of prosthetic heart valve: Secondary | ICD-10-CM | POA: Diagnosis not present

## 2017-09-16 DIAGNOSIS — E1169 Type 2 diabetes mellitus with other specified complication: Secondary | ICD-10-CM | POA: Diagnosis not present

## 2017-09-16 DIAGNOSIS — Z7901 Long term (current) use of anticoagulants: Secondary | ICD-10-CM

## 2017-09-16 DIAGNOSIS — I4891 Unspecified atrial fibrillation: Secondary | ICD-10-CM

## 2017-09-16 DIAGNOSIS — Z5181 Encounter for therapeutic drug level monitoring: Secondary | ICD-10-CM

## 2017-09-16 DIAGNOSIS — I10 Essential (primary) hypertension: Secondary | ICD-10-CM

## 2017-09-16 DIAGNOSIS — E785 Hyperlipidemia, unspecified: Secondary | ICD-10-CM | POA: Diagnosis not present

## 2017-09-16 LAB — POCT INR: INR: 2.8

## 2017-09-16 NOTE — Telephone Encounter (Signed)
Pt reported to CMA that she was taken off of the Amlodipine.  Do not refill.  Thanks.

## 2017-09-16 NOTE — Patient Instructions (Signed)

## 2017-09-16 NOTE — Progress Notes (Signed)
Cardiology Office Note    Date:  09/16/2017   ID:  TAWAN CORKERN, DOB May 12, 1951, MRN [id number]  PCP:  Mosie Lukes, MD  Cardiologist: Sinclair Grooms, MD   Chief Complaint  Patient presents with  . Shortness of Breath    WITH WALKING  . Cardiac Valve Problem    History of Present Illness:  [name] is a 67 y.o. female with aortic valve disease who is status post aortic valve replacement with a mechanical prosthesis 2011, history of recurrent atrial fibrillation, hyperlipidemia, diabetes mellitus type 2, and chronic anticoagulation therapy with Coumadin.  Doing well.  Having some back discomfort, left hip discomfort and pain in the left calf.  She denies chest pain, orthopnea, PND, and dyspnea on exertion.  No episodes of syncope.  No palpitations.  Overall feels well.  Past Medical History:  Diagnosis Date  . ANEMIA 05/28/2010  . AORTIC VALVE REPLACEMENT, HX OF 03/11/2010   Mechanical prosthesis  . BIPOLAR AFFECTIVE DISORDER 03/11/2010  . Depression   . Diverticulitis 12/18/2010  . FIBROIDS, UTERUS 03/11/2010  . Gout 09/04/2016  . Grave's disease 8-12  . Hyperlipidemia associated with type 2 diabetes mellitus ([place]) 06/05/2016  . Hypertension   . Migraine 06/05/2016  . Mixed hyperlipidemia 03/11/2010  . Obesity   . UTI (lower urinary tract infection) 08/14/2011    Past Surgical History:  Procedure Laterality Date  . ABDOMINAL HYSTERECTOMY  ?1998   sb/l spo, total for fibroids/heavy bleeding  . AORTIC VALVE REPLACEMENT  2011   Mechanical prosthesis, [employer]  . APPENDECTOMY     Required revision for EColi infection, required recurrent packing  . bowel obstruction     Requiring adhesions to be removed  . CHOLECYSTECTOMY    . COLONOSCOPY WITH PROPOFOL N/A 11/01/2015   Procedure: COLONOSCOPY WITH PROPOFOL;  Surgeon: Milus Banister, MD;  Location: [facility];  Service: Endoscopy;  Laterality: N/A;  . CYSTOCELE REPAIR N/A 10/16/2015   Procedure: ANTERIOR REPAIR  (CYSTOCELE);  Surgeon: Donnamae Jude, MD;  Location: [facility];  Service: Gynecology;  Laterality: N/A;  . HERNIA REPAIR  08-16-10  . LEFT HEART CATHETERIZATION WITH CORONARY ANGIOGRAM N/A 12/05/2011   Procedure: LEFT HEART CATHETERIZATION WITH CORONARY ANGIOGRAM;  Surgeon: Sinclair Grooms, MD;  [facility];  Service: Cardiovascular;  Laterality: N/A;  . TOTAL KNEE ARTHROPLASTY Right 02/24/2013   Procedure: RIGHT TOTAL KNEE ARTHROPLASTY;  Surgeon: Johnn Hai, MD;  Location: [facility];  Service: Orthopedics;  Laterality: Right;  . TUBAL LIGATION    . varicose vein surgery b/l legs      Current Medications: Outpatient Medications Prior to Visit  Medication Sig Dispense Refill  . acetaminophen (TYLENOL) 500 MG tablet Take 1,000-1,500 mg by mouth every 6 (six) hours as needed (For knee pain.).    Marland Kitchen aspirin EC 81 MG tablet Take 81 mg by mouth daily.    . busPIRone (BUSPAR) 15 MG tablet     . clonazePAM (KLONOPIN) 0.5 MG tablet Take 0.5 mg by mouth 2 (two) times daily.     . cyclobenzaprine (FLEXERIL) 10 MG tablet Take 10 mg by mouth 3 (three) times daily as needed for muscle spasms.    . febuxostat (ULORIC) 40 MG tablet Take 1 tablet (40 mg total) by mouth daily. 90 tablet 1  . fenofibrate 160 MG tablet Take 160 mg by mouth daily.    . furosemide (LASIX) 40 MG tablet Take 1 tablet (40 mg total)  by mouth daily. Please keep upcoming appt. Thank You. 30 tablet 0  . glucose blood (ONE TOUCH ULTRA TEST) test strip UAD qd for blood sugar Dx: E11.9 100 each 12  . HYDROcodone-acetaminophen (NORCO) 5-325 MG tablet Take 1 tablet by mouth every 6 (six) hours as needed for moderate pain. 10 tablet 0  . lamoTRIgine (LAMICTAL) 200 MG tablet Take 300 mg by mouth daily. Pt taking 1 and 1/2 tablet daily    . metoprolol succinate (TOPROL-XL) 25 MG 24 hr tablet TAKE ONE TABLET BY MOUTH EVERY MORNING 30 tablet 0  . ONE TOUCH LANCETS MISC Test once daily to check blood sugar. DX E11.9 100 each 3  .  pantoprazole (PROTONIX) 40 MG tablet TAKE ONE TABLET BY MOUTH DAILY 30 tablet 0  . Polyethyl Glycol-Propyl Glycol (SYSTANE OP) Place 1 drop into both eyes at bedtime as needed (For dry eyes.).     Marland Kitchen rosuvastatin (CRESTOR) 40 MG tablet TAKE 1 TABLET (40 MG TOTAL) BY MOUTH DAILY. 90 tablet 4  . SEROQUEL XR 400 MG 24 hr tablet Take 400 mg by mouth at bedtime.   1  . SUMAtriptan (IMITREX) 50 MG tablet TAKE ONE TABLET EVERY 2 HOURS AS NEEDED FOR MIGRAINE. MAY REPEAT IN 2 HOURS IF HEADACHE PERSISTS OR RECURS. (MAX OF 2 TABLETS IN 24 HOURS) 10 tablet 0  . tiZANidine (ZANAFLEX) 2 MG tablet Take 1 tablet (2 mg total) by mouth every 8 (eight) hours as needed for muscle spasms. 40 tablet 2  . traMADol (ULTRAM) 50 MG tablet Take 1 tablet (50 mg total) by mouth every 8 (eight) hours as needed. Severe pain 30 tablet 0  . venlafaxine XR (EFFEXOR-XR) 150 MG 24 hr capsule Take 150 mg by mouth every morning.    . warfarin (COUMADIN) 5 MG tablet TAKE AS DIRECED BY [facility] 90 tablet 1  . colesevelam (WELCHOL) 625 MG tablet Take 3 tablets (1,875 mg total) by mouth 2 (two) times daily with a meal. 180 tablet 3  . Probiotic Product (ALIGN) 4 MG CAPS Take 4 mg by mouth daily.    Marland Kitchen amLODipine (NORVASC) 5 MG tablet Take 1 tablet (5 mg total) by mouth daily. 90 tablet 3   No facility-administered medications prior to visit.      Allergies:   Mucinex [guaifenesin er]; Keflex [cephalexin]; and Sulfonamide derivatives   Social History   Socioeconomic History  . Marital status: Married    Spouse name: Not on file  . Number of children: Not on file  . Years of education: Not on file  . Highest education level: Not on file  Occupational History  . Not on file  Social Needs  . Financial resource strain: Not on file  . Food insecurity:    Worry: Not on file    Inability: Not on file  . Transportation needs:    Medical: Not on file    Non-medical: Not on file  Tobacco Use  . Smoking status: Former Smoker      Last attempt to quit: 05/26/1990    Years since quitting: 27.3  . Smokeless tobacco: Never Used  Substance and Sexual Activity  . Alcohol use: Yes    Alcohol/week: 0.0 oz    Comment: rare  . Drug use: No  . Sexual activity: Never  Lifestyle  . Physical activity:    Days per week: Not on file    Minutes per session: Not on file  . Stress: Not on file  Relationships  .  Social connections:    Talks on phone: Not on file    Gets together: Not on file    Attends religious service: Not on file    Active member of club or organization: Not on file    Attends meetings of clubs or organizations: Not on file    Relationship status: Not on file  Other Topics Concern  . Not on file  Social History Narrative  . Not on file     Family History:  The patient's family history includes Alcohol abuse in her brother, brother, father, and maternal grandfather; Aneurysm in her mother; Arthritis in her mother; Cancer in her maternal grandmother; Depression in her brother, brother, paternal grandfather, paternal grandmother, sister, and sister; Diabetes in her maternal grandfather, paternal grandfather, and paternal grandmother; Heart disease in her brother and maternal grandfather; Scoliosis in her mother.   ROS:   Please see the history of present illness.    Under stress at home.  Husband is been falling, recently fractured his left arm.  Back discomfort as mentioned.  Back pain, muscle pain, balance and difficulty walking, anxiety, occasional irregular heartbeat, fatigue, vision disturbance. All other systems reviewed and are negative.   PHYSICAL EXAM:   VS:  BP 124/72   Pulse 69   Ht 5\' 8"  (1.727 m)   Wt 254 lb (115.2 kg)   BMI 38.62 kg/m    GEN: Well nourished, well developed, in no acute distress  HEENT: normal  Neck: no JVD, carotid bruits, or masses Cardiac: Crisp mechanical valve closure sound.  No perivalvular leak is heard.  RRR; 1/6 systolic murmur with no rubs, or gallops,no  edema  Respiratory:  clear to auscultation bilaterally, normal work of breathing GI: soft, nontender, nondistended, + BS MS: no deformity or atrophy  Skin: warm and dry, no rash Neuro:  Alert and Oriented x 3, Strength and sensation are intact Psych: euthymic mood, full affect  Wt Readings from Last 3 Encounters:  09/16/17 254 lb (115.2 kg)  01/29/17 251 lb 3.2 oz (113.9 kg)  09/15/16 255 lb (115.7 kg)      Studies/Labs Reviewed:   EKG:  EKG normal sinus rhythm, prominent voltage, nonspecific T wave flattening.  Recent Labs: 01/29/2017: ALT 32; BUN 17; Creatinine, Ser 1.06; Hemoglobin 14.7; Platelets 233.0; Potassium 4.3; Sodium 139; TSH 2.53   Lipid Panel    Component Value Date/Time   CHOL 189 01/29/2017 1521   TRIG 346.0 (H) 01/29/2017 1521   HDL 50.00 01/29/2017 1521   CHOLHDL 4 01/29/2017 1521   VLDL 69.2 (H) 01/29/2017 1521   LDLCALC 71 04/04/2013 1622   LDLDIRECT 84.0 01/29/2017 1521    Additional studies/ records that were reviewed today include:  None    ASSESSMENT:    1. Atrial fibrillation, unspecified type ([place])   2. Essential hypertension, benign   3. Hx of mechanical aortic valve replacement   4. Hyperlipidemia associated with type 2 diabetes mellitus ([place])   5. Long term current use of anticoagulant therapy   6. Mixed hyperlipidemia   7. Encounter for therapeutic drug monitoring      PLAN:  In order of problems listed above:  1. No clinical evidence of atrial fibrillation currently. 2. Excellent blood pressure control. 3. Normally functioning mechanical aortic valve based upon clinical parameters 4. Followed by primary care 5. INR is pending today.  Overall doing well.  Clinical follow-up in 1 year.   Medication Adjustments/Labs and Tests Ordered: Current medicines are reviewed at length with the  patient today.  Concerns regarding medicines are outlined above.  Medication changes, Labs and Tests ordered today are listed in the Patient  Instructions below. There are no Patient Instructions on file for this visit.   Signed, Sinclair Grooms, MD  09/16/2017 2:41 PM    [facility] [address], [place], [place]  [address] Phone: [phone]; Fax: [phone]

## 2017-09-16 NOTE — Patient Instructions (Signed)
Description   Continue  same dose of coumadin  1/2 tablet daily except 1 tablet on Tuesdays and Saturdays.  Recheck INR in 4 weeks.

## 2017-09-18 ENCOUNTER — Other Ambulatory Visit: Payer: Self-pay | Admitting: *Deleted

## 2017-09-20 ENCOUNTER — Other Ambulatory Visit: Payer: Self-pay | Admitting: Interventional Cardiology

## 2017-09-21 MED ORDER — WARFARIN SODIUM 5 MG PO TABS: ORAL_TABLET | ORAL | 1 refills | Status: DC

## 2017-09-24 ENCOUNTER — Other Ambulatory Visit: Payer: Self-pay | Admitting: Interventional Cardiology

## 2017-09-24 NOTE — Telephone Encounter (Signed)
Spoke with pt and she was unsure of what the name of her BP medication was.  Called [facility] and they said last refill was in January for a 90 day supply.  Dr. Tamala Julian said ok to fill.  Spoke with pt and told her to take the medication and monitor her BP and call if BP is too high or too low.  Went over these parameters.  Pt verbalized understanding and was appreciative for call.    Ok to fill! Thanks!

## 2017-09-24 NOTE — Telephone Encounter (Signed)
This medication was removed from patients med list at her last office visit with Dr Tamala Julian with a reason of discontinued by provider. I do not see any mention in the note of patient being instructed to stop taking it. Please advise. Thanks, MI

## 2017-10-03 IMAGING — CT CT ABD-PELV W/ CM
1 of 3 series · 13 of 32 positions shown, 18 images · IV contrast (OMNIPAQUE)
Comparison: CT of the chest, abdomen and pelvis from 08/05/2011

CLINICAL DATA: Acute onset of vaginal pain and discharge. Recent
cystocele repair. Initial encounter.

EXAM:
CT ABDOMEN AND PELVIS WITH CONTRAST
TECHNIQUE: Multidetector CT imaging of the abdomen and pelvis was performed
using the standard protocol following bolus administration of
intravenous contrast.
CONTRAST:  100mL 1MD98F-5AA IOPAMIDOL (1MD98F-5AA) INJECTION 61%

[Series 2: routine abdomen/pelvis with · axial · 0.88mm/px · z∈[+687,+1137]mm · 13 of 102 slices shown, 18 images]
[im 6/102  soft-tissue]
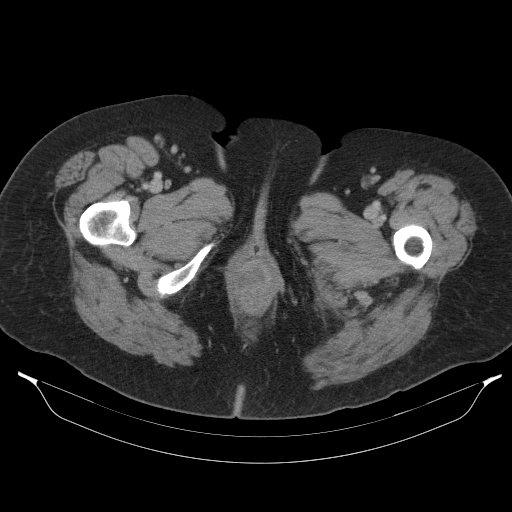
[im 6/102  bone]
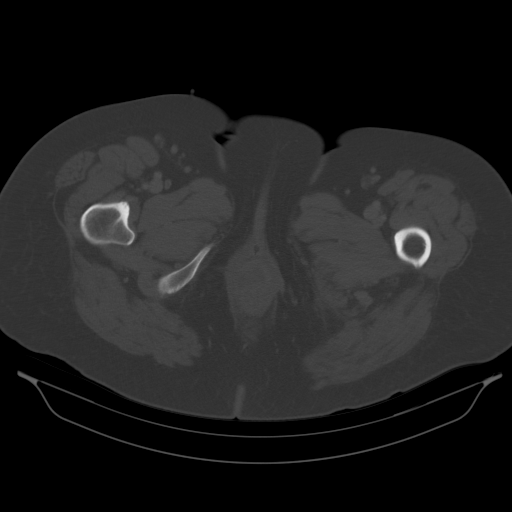
[im 16/102  soft-tissue]
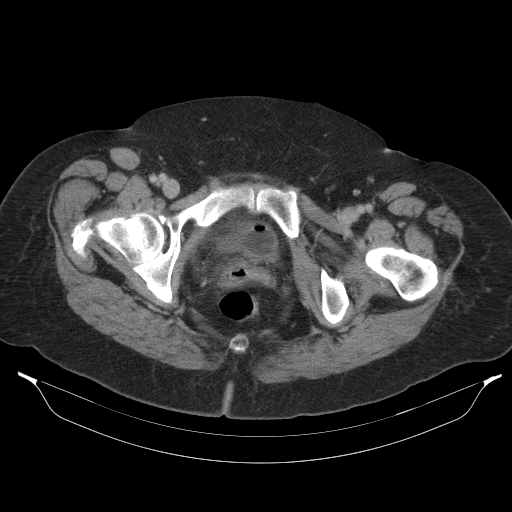
[im 21/102  soft-tissue]
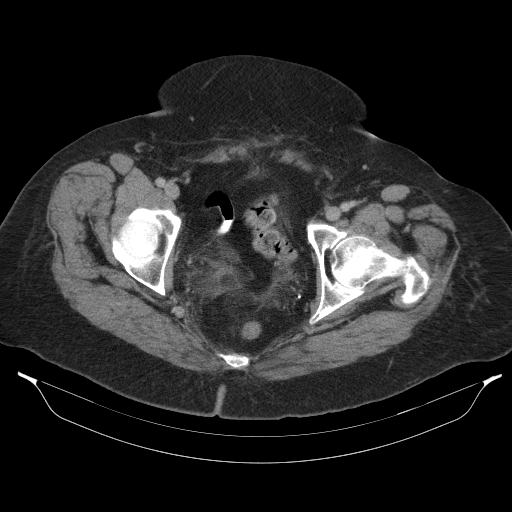
[im 31/102  soft-tissue]
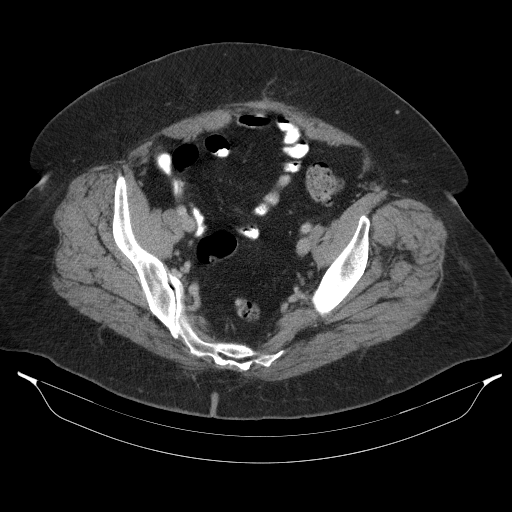
[im 41/102  soft-tissue]
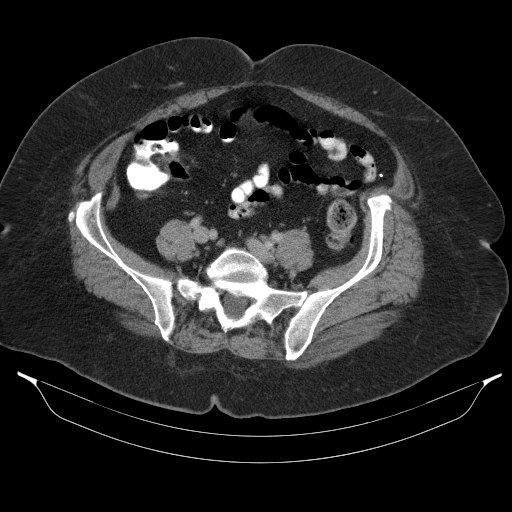
[im 46/102  soft-tissue]
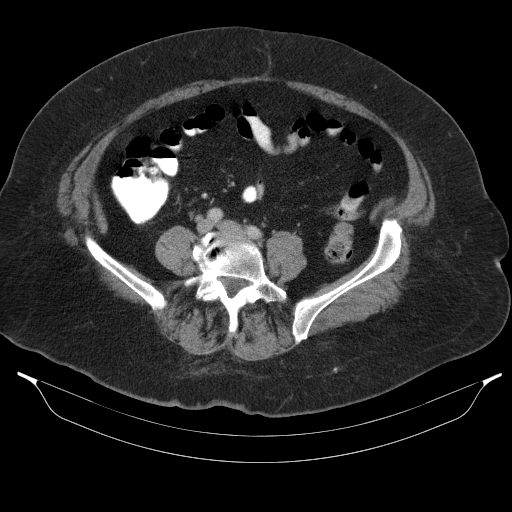
[im 56/102  soft-tissue]
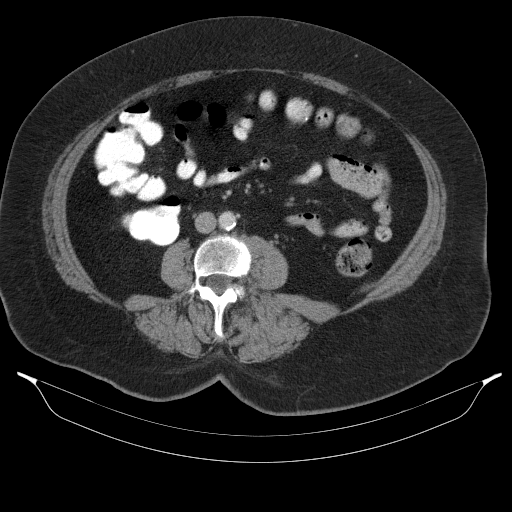
[im 61/102  soft-tissue]
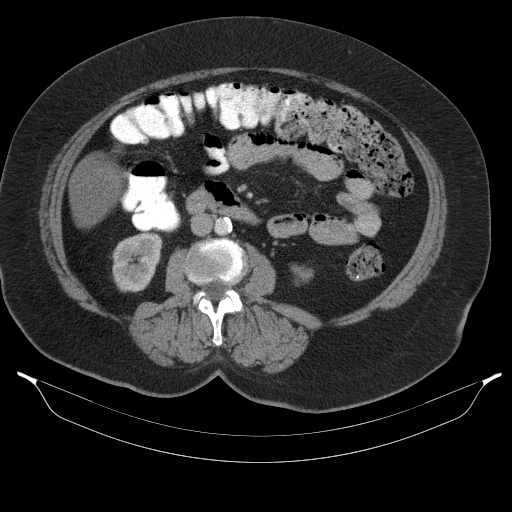
[im 71/102  soft-tissue]
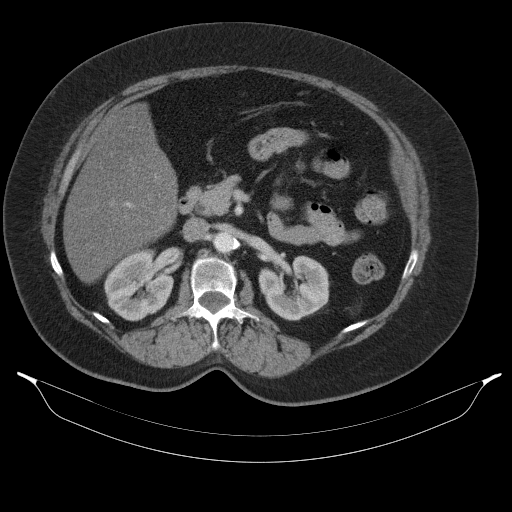
[im 71/102  bone]
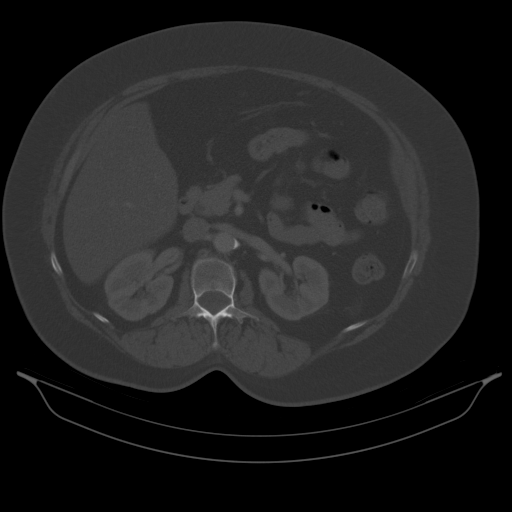
[im 81/102  soft-tissue]
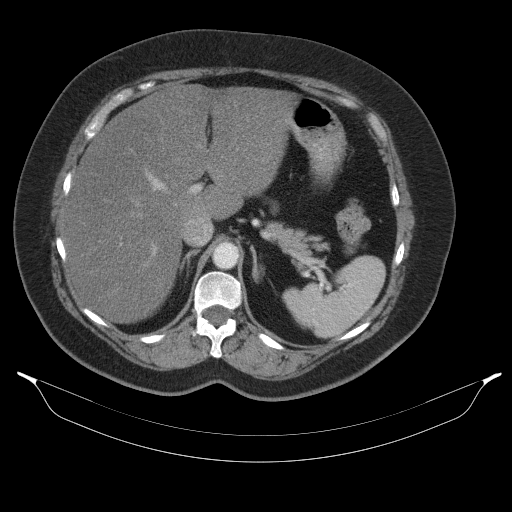
[im 81/102  lung]
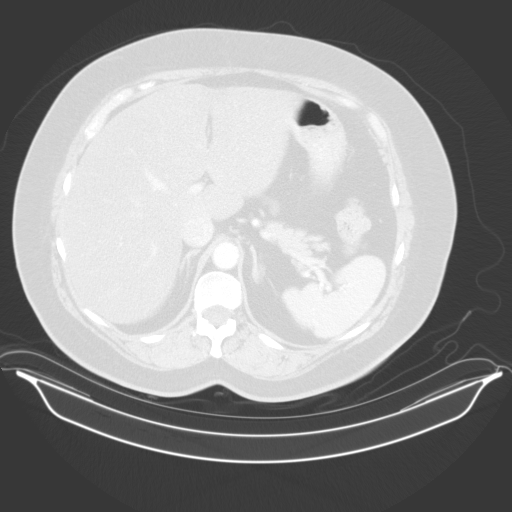
[im 86/102  soft-tissue]
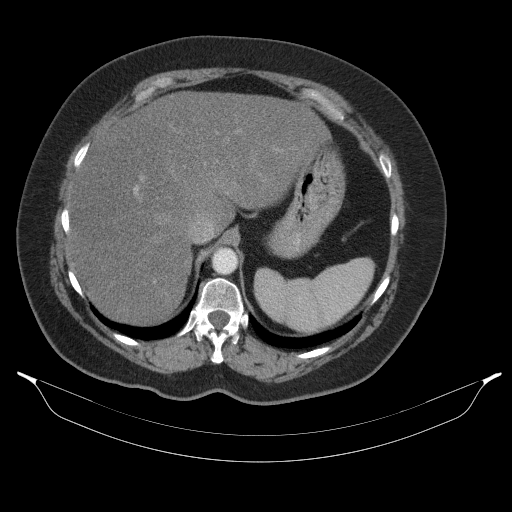
[im 86/102  lung]
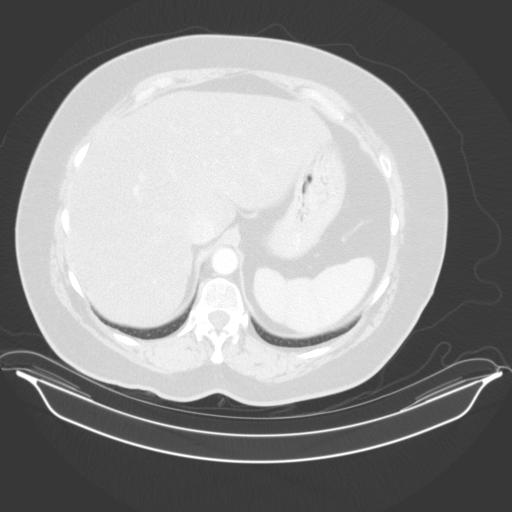
[im 91/102  lung]
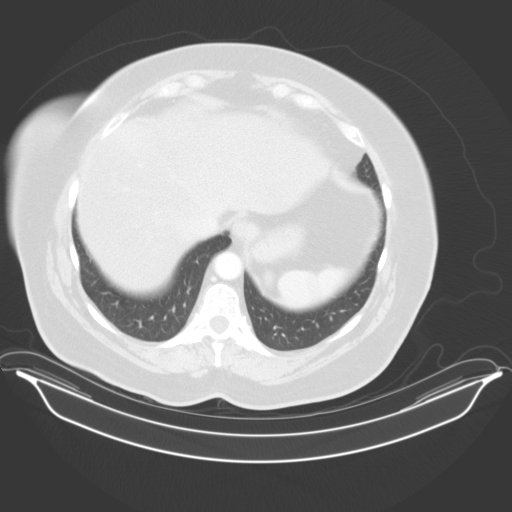
[im 96/102  soft-tissue]
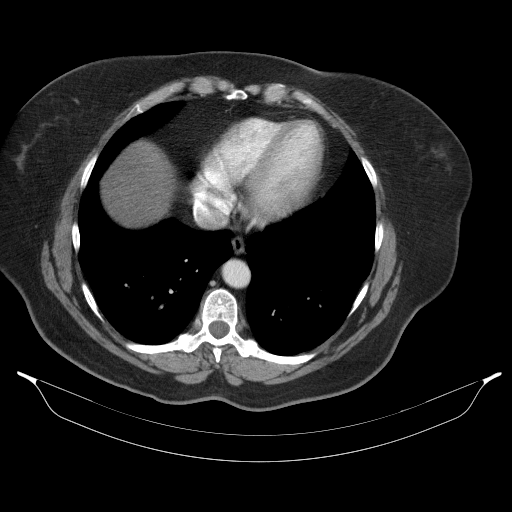
[im 96/102  lung]
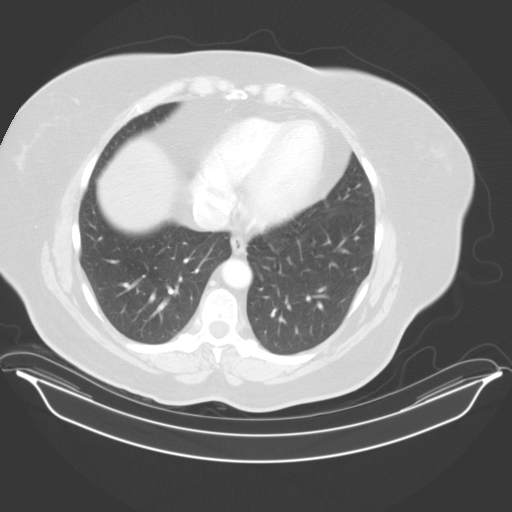

[13 of 32 positions shown; findings below may reference images not displayed]

FINDINGS: The visualized lung bases are clear. The patient is status post
median sternotomy.

The liver and spleen are unremarkable in appearance. The patient is
status post cholecystectomy, with clips noted at the gallbladder
fossa. The pancreas and adrenal glands are unremarkable.

A 1.9 cm cyst is noted at the upper pole of the left kidney. There
is no evidence of hydronephrosis. No renal or ureteral stones are
seen. No perinephric stranding is appreciated.

No free fluid is identified. The small bowel is unremarkable in
appearance. The stomach is within normal limits. No acute vascular
abnormalities are seen. Scattered calcification is seen along the
abdominal aorta and its branches.

A small periumbilical hernia is noted, just to the right of and
superior to the umbilicus, containing only fat.

The patient is status post appendectomy. Contrast progresses to the
level of the mid transverse colon. The remainder of the colon is
partially filled with stool. Scattered diverticulosis is noted along
the proximal sigmoid colon, without evidence of diverticulitis.

The bladder is decompressed, with a Foley catheter in place. Diffuse
soft tissue inflammation is noted about the vaginal cuff. Some of
this may be postoperative, though acute infection is a concern. A
small amount of air is noted at the vaginal cuff.

No suspicious adnexal masses are seen. No inguinal lymphadenopathy
is seen.

No acute osseous abnormalities are identified. Vacuum phenomenon is
noted at L4-L5.
IMPRESSION: 1. Diffuse soft tissue inflammation about the vaginal cuff. Some of
this may be postoperative, though acute infection is a concern.
Small amount of air noted at the vaginal cuff.
2. Bladder decompressed and grossly unremarkable, though difficult
to fully assess, with a Foley catheter in place.
3. Small left renal cyst noted.
4. Small periumbilical hernia noted, containing only fat.
5. Scattered calcification along the abdominal aorta and its
branches.
6. Scattered diverticulosis along the proximal sigmoid colon,
without evidence of diverticulitis.

## 2017-10-05 ENCOUNTER — Other Ambulatory Visit: Payer: Self-pay | Admitting: Family Medicine

## 2017-10-05 ENCOUNTER — Other Ambulatory Visit: Payer: Self-pay | Admitting: Interventional Cardiology

## 2017-10-14 ENCOUNTER — Ambulatory Visit (INDEPENDENT_AMBULATORY_CARE_PROVIDER_SITE_OTHER): Payer: PPO | Admitting: *Deleted

## 2017-10-14 DIAGNOSIS — I4891 Unspecified atrial fibrillation: Secondary | ICD-10-CM

## 2017-10-14 DIAGNOSIS — Z952 Presence of prosthetic heart valve: Secondary | ICD-10-CM

## 2017-10-14 DIAGNOSIS — Z5181 Encounter for therapeutic drug level monitoring: Secondary | ICD-10-CM | POA: Diagnosis not present

## 2017-10-14 LAB — POCT INR: INR: 3.5 — AB (ref 2.0–3.0)

## 2017-10-14 NOTE — Patient Instructions (Signed)
Description   Continue  same dose of coumadin  1/2 tablet daily except 1 tablet on Tuesdays and Saturdays.  Recheck INR in 5 weeks.

## 2017-10-20 ENCOUNTER — Other Ambulatory Visit: Payer: Self-pay | Admitting: Family Medicine

## 2017-11-02 ENCOUNTER — Encounter: Payer: Self-pay | Admitting: Family Medicine

## 2017-11-02 ENCOUNTER — Ambulatory Visit (INDEPENDENT_AMBULATORY_CARE_PROVIDER_SITE_OTHER): Payer: PPO | Admitting: Family Medicine

## 2017-11-02 VITALS — BP 136/68 | HR 65 | Temp 98.0°F | Resp 18 | Wt 254.2 lb

## 2017-11-02 DIAGNOSIS — G43909 Migraine, unspecified, not intractable, without status migrainosus: Secondary | ICD-10-CM | POA: Diagnosis not present

## 2017-11-02 DIAGNOSIS — E785 Hyperlipidemia, unspecified: Secondary | ICD-10-CM | POA: Diagnosis not present

## 2017-11-02 DIAGNOSIS — I4891 Unspecified atrial fibrillation: Secondary | ICD-10-CM

## 2017-11-02 DIAGNOSIS — R351 Nocturia: Secondary | ICD-10-CM | POA: Insufficient documentation

## 2017-11-02 DIAGNOSIS — N390 Urinary tract infection, site not specified: Secondary | ICD-10-CM

## 2017-11-02 DIAGNOSIS — E1142 Type 2 diabetes mellitus with diabetic polyneuropathy: Secondary | ICD-10-CM | POA: Insufficient documentation

## 2017-11-02 DIAGNOSIS — D510 Vitamin B12 deficiency anemia due to intrinsic factor deficiency: Secondary | ICD-10-CM

## 2017-11-02 DIAGNOSIS — E1165 Type 2 diabetes mellitus with hyperglycemia: Secondary | ICD-10-CM | POA: Diagnosis not present

## 2017-11-02 DIAGNOSIS — M545 Low back pain, unspecified: Secondary | ICD-10-CM | POA: Insufficient documentation

## 2017-11-02 DIAGNOSIS — E1169 Type 2 diabetes mellitus with other specified complication: Secondary | ICD-10-CM | POA: Diagnosis not present

## 2017-11-02 DIAGNOSIS — I10 Essential (primary) hypertension: Secondary | ICD-10-CM

## 2017-11-02 DIAGNOSIS — N289 Disorder of kidney and ureter, unspecified: Secondary | ICD-10-CM | POA: Diagnosis not present

## 2017-11-02 DIAGNOSIS — E113593 Type 2 diabetes mellitus with proliferative diabetic retinopathy without macular edema, bilateral: Secondary | ICD-10-CM | POA: Diagnosis not present

## 2017-11-02 HISTORY — DX: Low back pain, unspecified: M54.50

## 2017-11-02 LAB — URINALYSIS, ROUTINE W REFLEX MICROSCOPIC
Bilirubin Urine: NEGATIVE
Hgb urine dipstick: NEGATIVE
Nitrite: NEGATIVE
RBC / HPF: NONE SEEN (ref 0–?)
Specific Gravity, Urine: 1.025 (ref 1.000–1.030)
Urine Glucose: NEGATIVE
Urobilinogen, UA: 0.2 (ref 0.0–1.0)
pH: 5.5 (ref 5.0–8.0)

## 2017-11-02 MED ORDER — TRAMADOL HCL 50 MG PO TABS: 50.0000 mg | ORAL_TABLET | Freq: Three times a day (TID) | ORAL | 0 refills | Status: DC | PRN

## 2017-11-02 MED ORDER — ROPINIROLE HCL 0.25 MG PO TABS: 0.2500 mg | ORAL_TABLET | Freq: Every day | ORAL | 2 refills | Status: DC

## 2017-11-02 NOTE — Progress Notes (Signed)
Subjective:  I acted as a Education administrator for Dr. Charlett Blake. [place], [place]  Patient ID: [name], female    DOB: Nov 23, 1950, 67 y.o.   MRN: [id number]  No chief complaint on file.   HPI  Patient is in today for follow up. She feels well today but she struggles with fatigue and myalgias. She struggles with anhedonia but no suicidal ideation. She denies polydipsia but notes polyuria. Denies CP/palp/SOB/HA/congestion/fevers/GI or GU c/o. Taking meds as prescribed.   Patient Care Team: Mosie Lukes, MD as PCP - General Tamala Julian Lynnell Dike, MD as PCP - Cardiology (Cardiology)   Past Medical History:  Diagnosis Date  . ANEMIA 05/28/2010  . AORTIC VALVE REPLACEMENT, HX OF 03/11/2010   Mechanical prosthesis  . BIPOLAR AFFECTIVE DISORDER 03/11/2010  . Depression   . Diverticulitis 12/18/2010  . FIBROIDS, UTERUS 03/11/2010  . Gout 09/04/2016  . Grave's disease 8-12  . Hyperlipidemia associated with type 2 diabetes mellitus ([place]) 06/05/2016  . Hypertension   . Low back pain 11/02/2017  . Migraine 06/05/2016  . Mixed hyperlipidemia 03/11/2010  . Obesity   . UTI (lower urinary tract infection) 08/14/2011    Past Surgical History:  Procedure Laterality Date  . ABDOMINAL HYSTERECTOMY  ?1998   sb/l spo, total for fibroids/heavy bleeding  . AORTIC VALVE REPLACEMENT  2011   Mechanical prosthesis, [employer]  . APPENDECTOMY     Required revision for EColi infection, required recurrent packing  . bowel obstruction     Requiring adhesions to be removed  . CHOLECYSTECTOMY    . COLONOSCOPY WITH PROPOFOL N/A 11/01/2015   Procedure: COLONOSCOPY WITH PROPOFOL;  Surgeon: Milus Banister, MD;  Location: [facility];  Service: Endoscopy;  Laterality: N/A;  . CYSTOCELE REPAIR N/A 10/16/2015   Procedure: ANTERIOR REPAIR (CYSTOCELE);  Surgeon: Donnamae Jude, MD;  Location: [facility];  Service: Gynecology;  Laterality: N/A;  . HERNIA REPAIR  08-16-10  . LEFT HEART CATHETERIZATION WITH CORONARY ANGIOGRAM N/A 12/05/2011   Procedure: LEFT HEART CATHETERIZATION WITH CORONARY ANGIOGRAM;  Surgeon: Sinclair Grooms, MD;  Location: [facility] CATH [facility];  Service: Cardiovascular;  Laterality: N/A;  . TOTAL KNEE ARTHROPLASTY Right 02/24/2013   Procedure: RIGHT TOTAL KNEE ARTHROPLASTY;  Surgeon: Johnn Hai, MD;  Location: [facility];  Service: Orthopedics;  Laterality: Right;  . TUBAL LIGATION    . varicose vein surgery b/l legs      Family History  Problem Relation Age of Onset  . Scoliosis Mother   . Arthritis Mother        Rheumatoid  . Aneurysm Mother        heart  . Alcohol abuse Father   . Depression Sister   . Depression Brother   . Alcohol abuse Brother   . Cancer Maternal Grandmother        colon  . Diabetes Maternal Grandfather   . Heart disease Maternal Grandfather   . Alcohol abuse Maternal Grandfather   . Depression Paternal Grandmother   . Diabetes Paternal Grandmother   . Depression Paternal Grandfather   . Diabetes Paternal Grandfather   . Depression Sister   . Alcohol abuse Brother   . Depression Brother   . Heart disease Brother     Social History   Socioeconomic History  . Marital status: Married    Spouse name: Not on file  . Number of children: Not on file  . Years of education: Not on file  . Highest education level: Not on file  Occupational History  . Not on file  Social Needs  . Financial resource strain: Not on file  . Food insecurity:    Worry: Not on file    Inability: Not on file  . Transportation needs:    Medical: Not on file    Non-medical: Not on file  Tobacco Use  . Smoking status: Former Smoker    Last attempt to quit: 05/26/1990    Years since quitting: 27.4  . Smokeless tobacco: Never Used  Substance and Sexual Activity  . Alcohol use: Yes    Alcohol/week: 0.0 oz    Comment: rare  . Drug use: No  . Sexual activity: Never  Lifestyle  . Physical activity:    Days per week: Not on file    Minutes per session: Not on file  . Stress: Not on file    Relationships  . Social connections:    Talks on phone: Not on file    Gets together: Not on file    Attends religious service: Not on file    Active member of club or organization: Not on file    Attends meetings of clubs or organizations: Not on file    Relationship status: Not on file  . Intimate partner violence:    Fear of current or ex partner: Not on file    Emotionally abused: Not on file    Physically abused: Not on file    Forced sexual activity: Not on file  Other Topics Concern  . Not on file  Social History Narrative  . Not on file    Outpatient Medications Prior to Visit  Medication Sig Dispense Refill  . acetaminophen (TYLENOL) 500 MG tablet Take 1,000-1,500 mg by mouth every 6 (six) hours as needed (For knee pain.).    Marland Kitchen amLODipine (NORVASC) 5 MG tablet TAKE ONE TABLET BY MOUTH DAILY 90 tablet 3  . aspirin EC 81 MG tablet Take 81 mg by mouth daily.    . busPIRone (BUSPAR) 15 MG tablet     . clonazePAM (KLONOPIN) 0.5 MG tablet Take 0.5 mg by mouth 2 (two) times daily.     . colesevelam (WELCHOL) 625 MG tablet Take 3 tablets (1,875 mg total) by mouth 2 (two) times daily with a meal. 180 tablet 3  . cyclobenzaprine (FLEXERIL) 10 MG tablet Take 10 mg by mouth 3 (three) times daily as needed for muscle spasms.    . febuxostat (ULORIC) 40 MG tablet Take 1 tablet (40 mg total) by mouth daily. 90 tablet 1  . fenofibrate 160 MG tablet Take 160 mg by mouth daily.    . furosemide (LASIX) 40 MG tablet TAKE ONE TABLET BY MOUTH DAILY *PLEASE KEEP UPCOMING APPOINTMENT* 30 tablet 11  . glucose blood (ONE TOUCH ULTRA TEST) test strip UAD qd for blood sugar Dx: E11.9 100 each 12  . HYDROcodone-acetaminophen (NORCO) 5-325 MG tablet Take 1 tablet by mouth every 6 (six) hours as needed for moderate pain. 10 tablet 0  . lamoTRIgine (LAMICTAL) 200 MG tablet Take 300 mg by mouth daily. Pt taking 1 and 1/2 tablet daily    . ONE TOUCH LANCETS MISC Test once daily to check blood sugar. DX  E11.9 100 each 3  . Polyethyl Glycol-Propyl Glycol (SYSTANE OP) Place 1 drop into both eyes at bedtime as needed (For dry eyes.).     Marland Kitchen Probiotic Product (ALIGN) 4 MG CAPS Take 4 mg by mouth daily.    . rosuvastatin (CRESTOR) 40 MG tablet TAKE  ONE TABLET BY MOUTH DAILY 90 tablet 3  . SEROQUEL XR 400 MG 24 hr tablet Take 400 mg by mouth at bedtime.   1  . SUMAtriptan (IMITREX) 50 MG tablet TAKE ONE TABLET EVERY 2 HOURS AS NEEDED FOR MIGRAINE. MAY REPEAT IN 2 HOURS IF HEADACHE PERSISTS OR RECURS. (MAX OF 2 TABLETS IN 24 HOURS) 10 tablet 0  . tiZANidine (ZANAFLEX) 2 MG tablet Take 1 tablet (2 mg total) by mouth every 8 (eight) hours as needed for muscle spasms. 40 tablet 2  . venlafaxine XR (EFFEXOR-XR) 150 MG 24 hr capsule Take 150 mg by mouth every morning.    . warfarin (COUMADIN) 5 MG tablet TAKE AS DIRECED BY [facility] 90 tablet 1  . metoprolol succinate (TOPROL-XL) 25 MG 24 hr tablet TAKE ONE TABLET BY MOUTH EVERY MORNING 30 tablet 0  . pantoprazole (PROTONIX) 40 MG tablet TAKE ONE TABLET BY MOUTH DAILY 30 tablet 0  . traMADol (ULTRAM) 50 MG tablet Take 1 tablet (50 mg total) by mouth every 8 (eight) hours as needed. Severe pain 30 tablet 0   No facility-administered medications prior to visit.     Allergies  Allergen Reactions  . Mucinex [Guaifenesin Er] Other (See Comments)    Severe Headaches  . Keflex [Cephalexin] Other (See Comments)    Headaches,Dizziness  . Sulfonamide Derivatives Other (See Comments)    headaches    Review of Systems  Constitutional: Positive for malaise/fatigue. Negative for fever.  HENT: Negative for congestion.   Eyes: Negative for blurred vision.  Respiratory: Negative for shortness of breath.   Cardiovascular: Negative for chest pain, palpitations and leg swelling.  Gastrointestinal: Negative for abdominal pain, blood in stool and nausea.  Genitourinary: Negative for dysuria and frequency.  Musculoskeletal: Positive for back pain and myalgias.  Negative for falls.  Skin: Negative for rash.  Neurological: Negative for dizziness, loss of consciousness and headaches.  Endo/Heme/Allergies: Negative for environmental allergies.  Psychiatric/Behavioral: Negative for depression. The patient is not nervous/anxious.        Objective:    Physical Exam  Constitutional: She is oriented to person, place, and time. No distress.  HENT:  Head: Normocephalic and atraumatic.  Eyes: Conjunctivae are normal.  Neck: Neck supple. No thyromegaly present.  Cardiovascular: Normal rate.  No murmur heard. Irregularly irregular  Pulmonary/Chest: Effort normal and breath sounds normal. She has no wheezes.  Abdominal: She exhibits no distension and no mass.  Musculoskeletal: She exhibits no edema.  Lymphadenopathy:    She has no cervical adenopathy.  Neurological: She is alert and oriented to person, place, and time.  Skin: Skin is warm and dry. No rash noted. She is not diaphoretic.  Psychiatric: Judgment normal.    BP 136/68 (BP Location: Left Arm, Patient Position: Sitting, Cuff Size: Normal)   Pulse 65   Temp 98 F (36.7 C) (Oral)   Resp 18   Wt 254 lb 3.2 oz (115.3 kg)   SpO2 98%   BMI 38.65 kg/m  Wt Readings from Last 3 Encounters:  11/02/17 254 lb 3.2 oz (115.3 kg)  09/16/17 254 lb (115.2 kg)  01/29/17 251 lb 3.2 oz (113.9 kg)   BP Readings from Last 3 Encounters:  11/02/17 136/68  09/16/17 124/72  01/29/17 140/66     Immunization History  Administered Date(s) Administered  . Influenza Split 02/13/2011, 02/20/2012  . Influenza Whole 03/11/2010  . Influenza, High Dose Seasonal PF 06/05/2016, 01/29/2017  . Influenza,inj,Quad PF,6+ Mos 02/03/2013, 02/24/2014, 04/03/2015  .  Pneumococcal Conjugate-13 02/24/2014  . Pneumococcal Polysaccharide-23 02/28/2009, 03/22/2012  . Td 03/11/2010  . Zoster 03/15/2012    Health Maintenance  Topic Date Due  . Hepatitis C Screening  January 12, 1951  . FOOT EXAM  11/28/1960  . URINE  MICROALBUMIN  11/28/1960  . OPHTHALMOLOGY EXAM  01/28/2017  . PNA vac Low Risk Adult (2 of 2 - PPSV23) 03/22/2017  . MAMMOGRAM  04/02/2017  . INFLUENZA VACCINE  12/24/2017  . HEMOGLOBIN A1C  05/04/2018  . TETANUS/TDAP  03/11/2020  . COLONOSCOPY  10/31/2025  . DEXA SCAN  Completed    Lab Results  Component Value Date   WBC 6.6 11/02/2017   HGB 13.2 11/02/2017   HCT 39.1 11/02/2017   PLT 202.0 11/02/2017   GLUCOSE 231 (H) 11/02/2017   CHOL 201 (H) 11/02/2017   TRIG (H) 11/02/2017    456.0 Triglyceride is over 400; calculations on Lipids are invalid.   HDL 46.10 11/02/2017   LDLDIRECT 94.0 11/02/2017   LDLCALC 71 04/04/2013   ALT 24 11/02/2017   AST 19 11/02/2017   NA 139 11/02/2017   K 4.2 11/02/2017   CL 103 11/02/2017   CREATININE 0.94 11/02/2017   BUN 15 11/02/2017   CO2 27 11/02/2017   TSH 3.24 11/02/2017   INR 3.5 (A) 10/14/2017   HGBA1C 7.4 (H) 11/02/2017    Lab Results  Component Value Date   TSH 3.24 11/02/2017   Lab Results  Component Value Date   WBC 6.6 11/02/2017   HGB 13.2 11/02/2017   HCT 39.1 11/02/2017   MCV 89.2 11/02/2017   PLT 202.0 11/02/2017   Lab Results  Component Value Date   NA 139 11/02/2017   K 4.2 11/02/2017   CO2 27 11/02/2017   GLUCOSE 231 (H) 11/02/2017   BUN 15 11/02/2017   CREATININE 0.94 11/02/2017   BILITOT 0.5 11/02/2017   ALKPHOS 66 11/02/2017   AST 19 11/02/2017   ALT 24 11/02/2017   PROT 6.8 11/02/2017   ALBUMIN 4.3 11/02/2017   CALCIUM 9.5 11/02/2017   ANIONGAP 7 11/30/2015   GFR 63.14 11/02/2017   Lab Results  Component Value Date   CHOL 201 (H) 11/02/2017   Lab Results  Component Value Date   HDL 46.10 11/02/2017   Lab Results  Component Value Date   LDLCALC 71 04/04/2013   Lab Results  Component Value Date   TRIG (H) 11/02/2017    456.0 Triglyceride is over 400; calculations on Lipids are invalid.   Lab Results  Component Value Date   CHOLHDL 4 11/02/2017   Lab Results  Component Value  Date   HGBA1C 7.4 (H) 11/02/2017         Assessment & Plan:   Problem List Items Addressed This Visit    Renal insufficiency    Check cmp       Pernicious anemia    Check cbc       Relevant Orders   Vitamin B12 (Completed)   Urinary tract infection    UTI noted, rx with Ciprofloxacin      Atrial fibrillation ([place])    Rate controlled, tolerating Coumadin      Essential hypertension, benign    Well controlled, no changes to meds. Encouraged heart healthy diet such as the DASH diet and exercise as tolerated.       Relevant Orders   CBC (Completed)   Comprehensive metabolic panel (Completed)   TSH (Completed)   Hyperlipidemia associated with type 2 diabetes mellitus ([place])  Tolerating statin, encouraged heart healthy diet, avoid trans fats, minimize simple carbs and saturated fats. Increase exercise as tolerated      Relevant Orders   Lipid panel (Completed)   Migraine    No recent flare. Even with poor sleep      Relevant Medications   traMADol (ULTRAM) 50 MG tablet   Nocturia    Wakes up 4 x a night.      Relevant Orders   Urinalysis   Urine Culture (Completed)   Low back pain    Encouraged moist heat and gentle stretching as tolerated. May try NSAIDs and prescription meds as directed and report if symptoms worsen or seek immediate care. Has flared recently has used Tramadol in the past with adequate results      Relevant Medications   traMADol (ULTRAM) 50 MG tablet   Other Relevant Orders   Pain Mgmt, Profile 8 w/Conf, U   Diabetes ([place]) - Primary    hgba1c acceptable, minimize simple carbs. Increase exercise as tolerated. Continue current meds,  Check HGBA1C      Relevant Orders   Hemoglobin A1c (Completed)      I have discontinued Debroah Baller C. Adams's traMADol. I am also having her start on rOPINIRole and traMADol. Additionally, I am having her maintain her clonazePAM, ONE TOUCH LANCETS, SEROQUEL XR, aspirin EC, cyclobenzaprine, fenofibrate,  ALIGN, Polyethyl Glycol-Propyl Glycol (SYSTANE OP), venlafaxine XR, acetaminophen, lamoTRIgine, busPIRone, colesevelam, tiZANidine, HYDROcodone-acetaminophen, glucose blood, febuxostat, SUMAtriptan, warfarin, amLODipine, furosemide, and rosuvastatin.  Meds ordered this encounter  Medications  . rOPINIRole (REQUIP) 0.25 MG tablet    Sig: Take 1-2 tablets (0.25-0.5 mg total) by mouth at bedtime.    Dispense:  60 tablet    Refill:  2  . traMADol (ULTRAM) 50 MG tablet    Sig: Take 1 tablet (50 mg total) by mouth every 8 (eight) hours as needed.    Dispense:  30 tablet    Refill:  0   CMA served as scribe during this visit. History, Physical and Plan performed by medical provider. Documentation and orders reviewed and attested to.  Penni Homans, MD

## 2017-11-02 NOTE — Assessment & Plan Note (Signed)
Check cmp 

## 2017-11-02 NOTE — Assessment & Plan Note (Signed)
Check cbc 

## 2017-11-02 NOTE — Assessment & Plan Note (Signed)
Well controlled, no changes to meds. Encouraged heart healthy diet such as the DASH diet and exercise as tolerated.  °

## 2017-11-02 NOTE — Assessment & Plan Note (Signed)
hgba1c acceptable, minimize simple carbs. Increase exercise as tolerated. Continue current meds,  Check HGBA1C

## 2017-11-02 NOTE — Assessment & Plan Note (Signed)
Wakes up 4 x a night.

## 2017-11-02 NOTE — Assessment & Plan Note (Signed)
Rate controlled, tolerating Coumadin 

## 2017-11-02 NOTE — Assessment & Plan Note (Signed)
Tolerating statin, encouraged heart healthy diet, avoid trans fats, minimize simple carbs and saturated fats. Increase exercise as tolerated 

## 2017-11-02 NOTE — Assessment & Plan Note (Signed)
Encouraged moist heat and gentle stretching as tolerated. May try NSAIDs and prescription meds as directed and report if symptoms worsen or seek immediate care. Has flared recently has used Tramadol in the past with adequate results

## 2017-11-02 NOTE — Patient Instructions (Addendum)
Consider the new shingles shot at pharmacy, 2 shots over 2-6 months, at pharmacy   Restless Legs Syndrome Restless legs syndrome is a condition that causes uncomfortable feelings or sensations in the legs, especially while sitting or lying down. The sensations usually cause an overwhelming urge to move the legs. The arms can also sometimes be affected. The condition can range from mild to severe. The symptoms often interfere with a person's ability to sleep. What are the causes? The cause of this condition is not known. What increases the risk? This condition is more likely to develop in:  People who are older than age 70.  Pregnant women. In general, restless legs syndrome is more common in women than in men.  People who have a family history of the condition.  People who have certain medical conditions, such as iron deficiency, kidney disease, Parkinson disease, or nerve damage.  People who take certain medicines, such as medicines for high blood pressure, nausea, colds, allergies, depression, and some heart conditions.  What are the signs or symptoms? The main symptom of this condition is uncomfortable sensations in the legs. These sensations may be:  Described as pulling, tingling, prickling, throbbing, crawling, or burning.  Worse while you are sitting or lying down.  Worse during periods of rest or inactivity.  Worse at night, often interfering with your sleep.  Accompanied by a very strong urge to move your legs.  Temporarily relieved by movement of your legs.  The sensations usually affect both sides of the body. The arms can also be affected, but this is rare. People who have this condition often have tiredness during the day because of their lack of sleep at night. How is this diagnosed? This condition may be diagnosed based on your description of the symptoms. You may also have tests, including blood tests, to check for other conditions that may lead to your symptoms.  In some cases, you may be asked to spend some time in a sleep lab so your sleeping can be monitored. How is this treated? Treatment for this condition is focused on managing the symptoms. Treatment may include:  Self-help and lifestyle changes.  Medicines.  Follow these instructions at home:  Take medicines only as directed by your health care provider.  Try these methods to get temporary relief from the uncomfortable sensations: ? Massage your legs. ? Walk or stretch. ? Take a cold or hot bath.  Practice good sleep habits. For example, go to bed and get up at the same time every day.  Exercise regularly.  Practice ways of relaxing, such as yoga or meditation.  Avoid caffeine and alcohol.  Do not use any tobacco products, including cigarettes, chewing tobacco, or electronic cigarettes. If you need help quitting, ask your health care provider.  Keep all follow-up visits as directed by your health care provider. This is important. Contact a health care provider if: Your symptoms do not improve with treatment, or they get worse. This information is not intended to replace advice given to you by your health care provider. Make sure you discuss any questions you have with your health care provider. Document Released: 05/02/2002 Document Revised: 10/18/2015 Document Reviewed: 05/08/2014 Elsevier Interactive Patient Education  [employer].

## 2017-11-02 NOTE — Assessment & Plan Note (Signed)
No recent flare. Even with poor sleep

## 2017-11-03 LAB — LIPID PANEL
Cholesterol: 201 mg/dL — ABNORMAL HIGH (ref 0–200)
HDL: 46.1 mg/dL (ref >39.00)
Total CHOL/HDL Ratio: 4
Triglycerides: 456 mg/dL — ABNORMAL HIGH (ref 0.0–149.0)

## 2017-11-03 LAB — CBC
HCT: 39.1 % (ref 36.0–46.0)
Hemoglobin: 13.2 g/dL (ref 12.0–15.0)
MCHC: 33.8 g/dL (ref 30.0–36.0)
MCV: 89.2 fl (ref 78.0–100.0)
Platelets: 202 10*3/uL (ref 150.0–400.0)
RBC: 4.38 Mil/uL (ref 3.87–5.11)
RDW: 13.7 % (ref 11.5–15.5)
WBC: 6.6 10*3/uL (ref 4.0–10.5)

## 2017-11-03 LAB — COMPREHENSIVE METABOLIC PANEL
ALT: 24 U/L (ref 0–35)
AST: 19 U/L (ref 0–37)
Albumin: 4.3 g/dL (ref 3.5–5.2)
Alkaline Phosphatase: 66 U/L (ref 39–117)
BUN: 15 mg/dL (ref 6–23)
CO2: 27 mEq/L (ref 19–32)
Calcium: 9.5 mg/dL (ref 8.4–10.5)
Chloride: 103 mEq/L (ref 96–112)
Creatinine, Ser: 0.94 mg/dL (ref 0.40–1.20)
GFR: 63.14 mL/min (ref >60.00)
Glucose, Bld: 231 mg/dL — ABNORMAL HIGH (ref 70–99)
Potassium: 4.2 mEq/L (ref 3.5–5.1)
Sodium: 139 mEq/L (ref 135–145)
Total Bilirubin: 0.5 mg/dL (ref 0.2–1.2)
Total Protein: 6.8 g/dL (ref 6.0–8.3)

## 2017-11-03 LAB — HEMOGLOBIN A1C: Hgb A1c MFr Bld: 7.4 % — ABNORMAL HIGH (ref 4.6–6.5)

## 2017-11-03 LAB — LDL CHOLESTEROL, DIRECT: Direct LDL: 94 mg/dL

## 2017-11-03 LAB — TSH: TSH: 3.24 u[IU]/mL (ref 0.35–4.50)

## 2017-11-03 LAB — VITAMIN B12: Vitamin B-12: 252 pg/mL (ref 211–911)

## 2017-11-04 ENCOUNTER — Other Ambulatory Visit: Payer: Self-pay | Admitting: Family Medicine

## 2017-11-04 DIAGNOSIS — D2272 Melanocytic nevi of left lower limb, including hip: Secondary | ICD-10-CM | POA: Diagnosis not present

## 2017-11-04 DIAGNOSIS — R52 Pain, unspecified: Secondary | ICD-10-CM | POA: Diagnosis not present

## 2017-11-04 DIAGNOSIS — C4441 Basal cell carcinoma of skin of scalp and neck: Secondary | ICD-10-CM | POA: Diagnosis not present

## 2017-11-04 DIAGNOSIS — L821 Other seborrheic keratosis: Secondary | ICD-10-CM | POA: Diagnosis not present

## 2017-11-04 DIAGNOSIS — D2361 Other benign neoplasm of skin of right upper limb, including shoulder: Secondary | ICD-10-CM | POA: Diagnosis not present

## 2017-11-04 DIAGNOSIS — L820 Inflamed seborrheic keratosis: Secondary | ICD-10-CM | POA: Diagnosis not present

## 2017-11-04 DIAGNOSIS — L570 Actinic keratosis: Secondary | ICD-10-CM | POA: Diagnosis not present

## 2017-11-04 DIAGNOSIS — B078 Other viral warts: Secondary | ICD-10-CM | POA: Diagnosis not present

## 2017-11-04 NOTE — Assessment & Plan Note (Signed)
UTI noted, rx with Ciprofloxacin

## 2017-11-05 LAB — URINE CULTURE
MICRO NUMBER:: 90693527
SPECIMEN QUALITY:: ADEQUATE

## 2017-11-05 LAB — PAIN MGMT, PROFILE 8 W/CONF, U
6 Acetylmorphine: NEGATIVE ng/mL (ref <10)
Alcohol Metabolites: NEGATIVE ng/mL (ref <500)
Alphahydroxyalprazolam: NEGATIVE ng/mL (ref <25)
Alphahydroxymidazolam: NEGATIVE ng/mL (ref <50)
Alphahydroxytriazolam: NEGATIVE ng/mL (ref <50)
Aminoclonazepam: 242 ng/mL — ABNORMAL HIGH (ref <25)
Amphetamines: NEGATIVE ng/mL (ref <500)
Benzodiazepines: POSITIVE ng/mL — AB (ref <100)
Buprenorphine, Urine: NEGATIVE ng/mL (ref <5)
Cocaine Metabolite: NEGATIVE ng/mL (ref <150)
Creatinine: 159.4 mg/dL
Hydroxyethylflurazepam: NEGATIVE ng/mL (ref <50)
Lorazepam: NEGATIVE ng/mL (ref <50)
MDMA: NEGATIVE ng/mL (ref <500)
Marijuana Metabolite: NEGATIVE ng/mL (ref <20)
Nordiazepam: NEGATIVE ng/mL (ref <50)
Opiates: NEGATIVE ng/mL (ref <100)
Oxazepam: NEGATIVE ng/mL (ref <50)
Oxidant: NEGATIVE ug/mL (ref <200)
Oxycodone: NEGATIVE ng/mL (ref <100)
Temazepam: NEGATIVE ng/mL (ref <50)
pH: 6.21 (ref 4.5–9.0)

## 2017-11-10 MED ORDER — CIPROFLOXACIN HCL 250 MG PO TABS: 250.0000 mg | ORAL_TABLET | Freq: Two times a day (BID) | ORAL | 0 refills | Status: DC

## 2017-11-10 MED FILL — CIPROFLOXACIN HCL 250 MG TA: 250 | 5 days supply | Qty: 10 | Fill #0

## 2017-11-10 NOTE — Addendum Note (Signed)
Addended by: Magdalene Molly A on: 11/10/2017 02:31 PM   Modules accepted: Orders

## 2017-11-13 DIAGNOSIS — C4441 Basal cell carcinoma of skin of scalp and neck: Secondary | ICD-10-CM | POA: Diagnosis not present

## 2017-11-13 DIAGNOSIS — Z85828 Personal history of other malignant neoplasm of skin: Secondary | ICD-10-CM | POA: Diagnosis not present

## 2017-12-01 ENCOUNTER — Other Ambulatory Visit: Payer: Self-pay | Admitting: Family Medicine

## 2017-12-02 ENCOUNTER — Ambulatory Visit: Payer: PPO | Admitting: *Deleted

## 2017-12-02 DIAGNOSIS — Z952 Presence of prosthetic heart valve: Secondary | ICD-10-CM

## 2017-12-02 DIAGNOSIS — Z5181 Encounter for therapeutic drug level monitoring: Secondary | ICD-10-CM

## 2017-12-02 DIAGNOSIS — I4891 Unspecified atrial fibrillation: Secondary | ICD-10-CM | POA: Diagnosis not present

## 2017-12-02 LAB — POCT INR: INR: 2.9 (ref 2.0–3.0)

## 2017-12-02 NOTE — Patient Instructions (Signed)
Description   Continue  same dose of coumadin  1/2 tablet daily except 1 tablet on Tuesdays and Saturdays.  Recheck INR in 6 weeks.

## 2017-12-05 ENCOUNTER — Other Ambulatory Visit: Payer: Self-pay | Admitting: Family Medicine

## 2018-01-04 ENCOUNTER — Other Ambulatory Visit: Payer: Self-pay | Admitting: Family Medicine

## 2018-01-13 ENCOUNTER — Ambulatory Visit: Payer: PPO

## 2018-01-13 DIAGNOSIS — I4891 Unspecified atrial fibrillation: Secondary | ICD-10-CM

## 2018-01-13 DIAGNOSIS — Z952 Presence of prosthetic heart valve: Secondary | ICD-10-CM

## 2018-01-13 DIAGNOSIS — Z5181 Encounter for therapeutic drug level monitoring: Secondary | ICD-10-CM

## 2018-01-13 LAB — POCT INR: INR: 3.4 — AB (ref 2.0–3.0)

## 2018-01-13 NOTE — Patient Instructions (Signed)
Description   Continue  same dose of coumadin 1/2 tablet daily except 1 tablet on Tuesdays and Saturdays.  Recheck INR in 6 weeks.

## 2018-01-15 DIAGNOSIS — F39 Unspecified mood [affective] disorder: Secondary | ICD-10-CM | POA: Diagnosis not present

## 2018-01-22 DIAGNOSIS — M2041 Other hammer toe(s) (acquired), right foot: Secondary | ICD-10-CM | POA: Diagnosis not present

## 2018-01-22 DIAGNOSIS — M79671 Pain in right foot: Secondary | ICD-10-CM | POA: Diagnosis not present

## 2018-01-22 DIAGNOSIS — Z978 Presence of other specified devices: Secondary | ICD-10-CM | POA: Diagnosis not present

## 2018-01-22 DIAGNOSIS — M25572 Pain in left ankle and joints of left foot: Secondary | ICD-10-CM | POA: Diagnosis not present

## 2018-01-24 HISTORY — PX: ANKLE SURGERY: SHX546

## 2018-01-26 ENCOUNTER — Telehealth: Payer: Self-pay

## 2018-01-26 NOTE — Telephone Encounter (Signed)
   [facility] Pre-operative Risk Assessment    Request for surgical clearance:  1. What type of surgery is being performed?  Hardware removal L tibl/fib   2. When is this surgery scheduled?  TBD   3. What type of clearance is required (medical clearance vs. Pharmacy clearance to hold med vs. Both)? medical  4. Are there any medications that need to be held prior to surgery and how long?   5. Practice name and name of physician performing surgery? [facility]/Dr Doran Durand   6. What is your office phone number [phone]    7.   What is your office fax number [phone]  8.   Anesthesia type (None, local, MAC, general) ? general   Frederik Schmidt 01/26/2018, 2:10 PM  _________________________________________________________________   (provider comments below)

## 2018-01-27 NOTE — Telephone Encounter (Signed)
   Primary Cardiologist: Sinclair Grooms, MD  Chart reviewed as part of pre-operative protocol coverage. Patient was contacted 01/27/2018 in reference to pre-operative risk assessment for pending surgery as outlined below.  [name] was last seen on 08/2017  by Dr. Tamala Julian.  Since that day, [name] has done well. She is getting > 4 mets of activity.   Therefore, based on ACC/AHA guidelines, the patient would be at acceptable risk for the planned procedure without further cardiovascular testing.   Will ask pharmacy to review coumadin in case needed to hold.   [place], [place] 01/27/2018, 1:44 PM

## 2018-01-27 NOTE — Telephone Encounter (Signed)
Patient with diagnosis of St Jude aortic valve replacement and AFib on WARFARIN for anticoagulation.    Procedure: HARDWARE REMOVAL L TIB Date of procedure: TBD  CHADS2-VASc score of  4 (HTN, AGE, DM2, female)  Per office protocol, patient can hold WARFARIN for 5 days prior to procedure.   WILL need bridging with Lovenox (enoxaparin) around procedure.   Rameen Gohlke Rodriguez-Guzman PharmD, BCPS, [facility] [address] [place] [address] 01/27/2018 4:36 PM

## 2018-01-28 NOTE — Telephone Encounter (Signed)
I will route this recommendation to the requesting party via Epic fax function and remove from pre-op pool.  Please call with questions.  [place], [place] 01/28/2018, 1:43 PM

## 2018-01-29 ENCOUNTER — Telehealth: Payer: Self-pay | Admitting: *Deleted

## 2018-01-29 MED ORDER — ENOXAPARIN SODIUM 120 MG/0.8ML ~~LOC~~ SOLN: 120.0000 mg | Freq: Two times a day (BID) | SUBCUTANEOUS | 0 refills | Status: DC

## 2018-01-29 NOTE — Telephone Encounter (Signed)
01/27/18: Last dose of Coumadin.  01/28/18: No Coumadin or Lovenox.  01/29/18: Inject Lovenox 120mg  in the fatty abdominal tissue at least 2 inches from the belly button twice a day about 12 hours apart, 10am and 10pm rotate sites. No Coumadin.  01/30/18: Inject Lovenox in the fatty tissue every 12 hours, 10am and 10pm. No Coumadin.  01/31/18: Inject Lovenox in the fatty tissue every 12 hours, 10am and 10pm. No Coumadin.  02/01/18: Inject Lovenox in the fatty tissue in the morning at 10am (No PM dose). No Coumadin.  02/02/18: Procedure Day - No Lovenox - Resume Coumadin in the evening or as directed by doctor (take an extra half tablet with usual dose for 2 days then resume normal dose).  02/03/18: Resume Lovenox inject in the fatty tissue every 12 hours and take Coumadin.  02/04/18: Inject Lovenox in the fatty tissue every 12 hours and take Coumadin.  02/05/18: Inject Lovenox in the fatty tissue every 12 hours and take Coumadin.  02/06/18: Inject Lovenox in the fatty tissue every 12 hours and take Coumadin.  02/07/18: Inject Lovenox in the fatty tissue every 12 hours and take Coumadin.  02/08/18: Inject Lovenox in the fatty tissue at 10 am.  Coumadin appt at 1pm to check INR.

## 2018-02-01 ENCOUNTER — Telehealth: Payer: Self-pay | Admitting: *Deleted

## 2018-02-01 NOTE — Telephone Encounter (Signed)
Patient called to report that she woke up with blood on her gown from a mole on her abdomen. She stated the Lovenox has caused this and she is pending a procedure.  She became tearful and stated that it is not currently bleeding and stopped soon after it was noticed. After assisting patient and calming her down over the phone, I educated her on the use of Lovenox it's purpose and since she is pending a procedure she is holding her Warfarin. Advised her to have PCP checkout that area and she states since it had stopped she will monitor it and she will call back if needed.

## 2018-02-02 ENCOUNTER — Ambulatory Visit: Payer: PPO | Admitting: Family Medicine

## 2018-02-02 DIAGNOSIS — T84293A Other mechanical complication of internal fixation device of bones of foot and toes, initial encounter: Secondary | ICD-10-CM | POA: Diagnosis not present

## 2018-02-02 DIAGNOSIS — Z472 Encounter for removal of internal fixation device: Secondary | ICD-10-CM | POA: Diagnosis not present

## 2018-02-02 DIAGNOSIS — S8992XA Unspecified injury of left lower leg, initial encounter: Secondary | ICD-10-CM | POA: Diagnosis not present

## 2018-02-03 ENCOUNTER — Emergency Department (HOSPITAL_COMMUNITY): Payer: PPO

## 2018-02-03 ENCOUNTER — Encounter (HOSPITAL_COMMUNITY): Payer: Self-pay | Admitting: Emergency Medicine

## 2018-02-03 ENCOUNTER — Inpatient Hospital Stay (HOSPITAL_COMMUNITY)
Admission: EM | Admit: 2018-02-03 | Discharge: 2018-02-08 | DRG: 948 | Disposition: A | Discharge status: Skilled Nursing Facility | Payer: PPO | Attending: Orthopedic Surgery | Admitting: Orthopedic Surgery

## 2018-02-03 DIAGNOSIS — Z90710 Acquired absence of both cervix and uterus: Secondary | ICD-10-CM | POA: Diagnosis not present

## 2018-02-03 DIAGNOSIS — Z96651 Presence of right artificial knee joint: Secondary | ICD-10-CM | POA: Diagnosis present

## 2018-02-03 DIAGNOSIS — Z743 Need for continuous supervision: Secondary | ICD-10-CM | POA: Diagnosis not present

## 2018-02-03 DIAGNOSIS — Z6836 Body mass index (BMI) 36.0-36.9, adult: Secondary | ICD-10-CM

## 2018-02-03 DIAGNOSIS — E782 Mixed hyperlipidemia: Secondary | ICD-10-CM | POA: Diagnosis not present

## 2018-02-03 DIAGNOSIS — Z9049 Acquired absence of other specified parts of digestive tract: Secondary | ICD-10-CM | POA: Diagnosis not present

## 2018-02-03 DIAGNOSIS — Z7982 Long term (current) use of aspirin: Secondary | ICD-10-CM

## 2018-02-03 DIAGNOSIS — M25572 Pain in left ankle and joints of left foot: Secondary | ICD-10-CM | POA: Diagnosis present

## 2018-02-03 DIAGNOSIS — E669 Obesity, unspecified: Secondary | ICD-10-CM | POA: Diagnosis not present

## 2018-02-03 DIAGNOSIS — Z79891 Long term (current) use of opiate analgesic: Secondary | ICD-10-CM

## 2018-02-03 DIAGNOSIS — Z472 Encounter for removal of internal fixation device: Secondary | ICD-10-CM | POA: Diagnosis not present

## 2018-02-03 DIAGNOSIS — Z882 Allergy status to sulfonamides status: Secondary | ICD-10-CM | POA: Diagnosis not present

## 2018-02-03 DIAGNOSIS — Z952 Presence of prosthetic heart valve: Secondary | ICD-10-CM | POA: Diagnosis not present

## 2018-02-03 DIAGNOSIS — Z833 Family history of diabetes mellitus: Secondary | ICD-10-CM

## 2018-02-03 DIAGNOSIS — Z87440 Personal history of urinary (tract) infections: Secondary | ICD-10-CM

## 2018-02-03 DIAGNOSIS — M109 Gout, unspecified: Secondary | ICD-10-CM | POA: Diagnosis not present

## 2018-02-03 DIAGNOSIS — Z881 Allergy status to other antibiotic agents status: Secondary | ICD-10-CM | POA: Diagnosis not present

## 2018-02-03 DIAGNOSIS — Z8249 Family history of ischemic heart disease and other diseases of the circulatory system: Secondary | ICD-10-CM | POA: Diagnosis not present

## 2018-02-03 DIAGNOSIS — I4891 Unspecified atrial fibrillation: Secondary | ICD-10-CM | POA: Diagnosis not present

## 2018-02-03 DIAGNOSIS — G8918 Other acute postprocedural pain: Principal | ICD-10-CM | POA: Diagnosis present

## 2018-02-03 DIAGNOSIS — R262 Difficulty in walking, not elsewhere classified: Secondary | ICD-10-CM | POA: Diagnosis not present

## 2018-02-03 DIAGNOSIS — I10 Essential (primary) hypertension: Secondary | ICD-10-CM | POA: Diagnosis not present

## 2018-02-03 DIAGNOSIS — R279 Unspecified lack of coordination: Secondary | ICD-10-CM | POA: Diagnosis not present

## 2018-02-03 DIAGNOSIS — F319 Bipolar disorder, unspecified: Secondary | ICD-10-CM | POA: Diagnosis present

## 2018-02-03 DIAGNOSIS — Z4789 Encounter for other orthopedic aftercare: Secondary | ICD-10-CM | POA: Diagnosis not present

## 2018-02-03 DIAGNOSIS — R488 Other symbolic dysfunctions: Secondary | ICD-10-CM | POA: Diagnosis not present

## 2018-02-03 DIAGNOSIS — M25579 Pain in unspecified ankle and joints of unspecified foot: Secondary | ICD-10-CM | POA: Diagnosis not present

## 2018-02-03 DIAGNOSIS — Z7901 Long term (current) use of anticoagulants: Secondary | ICD-10-CM

## 2018-02-03 DIAGNOSIS — R278 Other lack of coordination: Secondary | ICD-10-CM | POA: Diagnosis not present

## 2018-02-03 DIAGNOSIS — Z87891 Personal history of nicotine dependence: Secondary | ICD-10-CM

## 2018-02-03 DIAGNOSIS — E1169 Type 2 diabetes mellitus with other specified complication: Secondary | ICD-10-CM | POA: Diagnosis not present

## 2018-02-03 DIAGNOSIS — R52 Pain, unspecified: Secondary | ICD-10-CM | POA: Diagnosis not present

## 2018-02-03 DIAGNOSIS — I499 Cardiac arrhythmia, unspecified: Secondary | ICD-10-CM | POA: Diagnosis not present

## 2018-02-03 DIAGNOSIS — M6281 Muscle weakness (generalized): Secondary | ICD-10-CM | POA: Diagnosis not present

## 2018-02-03 DIAGNOSIS — Z79899 Other long term (current) drug therapy: Secondary | ICD-10-CM

## 2018-02-03 DIAGNOSIS — R2681 Unsteadiness on feet: Secondary | ICD-10-CM | POA: Diagnosis not present

## 2018-02-03 MED ORDER — OXYCODONE-ACETAMINOPHEN 5-325 MG PO TABS
1.0000 | ORAL_TABLET | Freq: Once | ORAL | Status: AC
  Administered 2018-02-03: 1 via ORAL
  Filled 2018-02-03: qty 1

## 2018-02-03 MED ORDER — MORPHINE SULFATE (PF) 4 MG/ML IV SOLN: 4.0000 mg | Freq: Once | INTRAVENOUS | Status: DC

## 2018-02-03 MED ORDER — HYDROMORPHONE HCL 1 MG/ML IJ SOLN
1.0000 mg | Freq: Once | INTRAMUSCULAR | Status: AC
  Administered 2018-02-03: 1 mg via INTRAMUSCULAR
  Filled 2018-02-03: qty 1

## 2018-02-03 NOTE — ED Provider Notes (Signed)
Medical screening examination/treatment/procedure(s) were conducted as a shared visit with non-physician practitioner(s) and myself.  I personally evaluated the patient during the encounter.  None   Patient is a 67 year old female that underwent hardware removal from her left ankle by Dr. Doran Durand yesterday.  Presents here with uncontrolled pain despite taking hydrocodone at home every 4 hours.  She has strong pulses on exam, normal sensation, no significant swelling or calf tenderness.  She is currently on Lovenox.  Pain mostly around her incision sites.  Incisions are closed, clean and dry.  No redness, warmth or purulent drainage.  No fever.  X-ray shows post hardware removal but no other acute abnormalities.  Doubt DVT at this time given surgery was yesterday and she is on Lovenox.  No signs of infection.  No fracture.  Admitted for pain control to orthopedics.   Aileene Lanum, Delice Bison, DO 02/04/18 [phone]

## 2018-02-03 NOTE — ED Provider Notes (Addendum)
[facility] EMERGENCY DEPARTMENT Provider Note   CSN: [id number] Arrival date & time: 02/03/18  2115     History   Chief Complaint Chief Complaint  Patient presents with  . Ankle Pain    Post Surgery    HPI [name] is a 67 y.o. female.  The history is provided by the patient and a significant other. No language interpreter was used.  Ankle Pain       67 year old female with a recent left ankle surgery brought here via EMS for evaluation of left ankle pain.  Patient reports she had hardware in her left ankle that was causing pain because it was loosed.  Hardware was in her ankle for approximately 5 years.  Orthopedist Dr. Doran Durand perform surgery to remove hardware yesterday.  Patient was sent home with Vicodin 2 tablets every 4 hours.  Per daughter, the pain medication does not provide adequate relief.  She is currently complaining of "horrible pain" 30 out of 10 that is been persistent.  There is no associated fever, chest pain, trouble breathing or shortness of breath.  She denies any numbness.  She denies radiating pain.  She is currently on Lovenox.  She was on warfarin for atrial fibrillation as well as mechanical heart valve.  She denies any right leg pain.  Past Medical History:  Diagnosis Date  . ANEMIA 05/28/2010  . AORTIC VALVE REPLACEMENT, HX OF 03/11/2010   Mechanical prosthesis  . BIPOLAR AFFECTIVE DISORDER 03/11/2010  . Depression   . Diverticulitis 12/18/2010  . FIBROIDS, UTERUS 03/11/2010  . Gout 09/04/2016  . Grave's disease 8-12  . Hyperlipidemia associated with type 2 diabetes mellitus ([place]) 06/05/2016  . Hypertension   . Low back pain 11/02/2017  . Migraine 06/05/2016  . Mixed hyperlipidemia 03/11/2010  . Obesity   . UTI (lower urinary tract infection) 08/14/2011    Patient Active Problem List   Diagnosis Date Noted  . Nocturia 11/02/2017  . Low back pain 11/02/2017  . Diabetes ([place]) 11/02/2017  . Hyperlipidemia associated with  type 2 diabetes mellitus ([place]) 06/05/2016  . Migraine 06/05/2016  . Encounter for therapeutic drug monitoring 09/13/2013  . Essential hypertension, benign 04/05/2013  . Atrial fibrillation ([place]) 03/18/2013  . Long term current use of anticoagulant therapy 03/11/2013  . Urinary tract infection 02/10/2013  . Pernicious anemia 02/21/2011  . Renal insufficiency 12/30/2010  . Hyperthyroidism 12/18/2010  . MIXED HYPERLIPIDEMIA 03/11/2010  . Bipolar disorder ([place]) 03/11/2010  . DIVERTICULOSIS OF COLON 03/11/2010  . Constipation 03/11/2010  . Hx of mechanical aortic valve replacement 03/11/2010    Past Surgical History:  Procedure Laterality Date  . ABDOMINAL HYSTERECTOMY  ?1998   sb/l spo, total for fibroids/heavy bleeding  . AORTIC VALVE REPLACEMENT  2011   Mechanical prosthesis, St. Jude  . APPENDECTOMY     Required revision for EColi infection, required recurrent packing  . bowel obstruction     Requiring adhesions to be removed  . CHOLECYSTECTOMY    . COLONOSCOPY WITH PROPOFOL N/A 11/01/2015   Procedure: COLONOSCOPY WITH PROPOFOL;  Surgeon: Milus Banister, MD;  Location: [facility];  Service: Endoscopy;  Laterality: N/A;  . CYSTOCELE REPAIR N/A 10/16/2015   Procedure: ANTERIOR REPAIR (CYSTOCELE);  Surgeon: Donnamae Jude, MD;  Location: [facility];  Service: Gynecology;  Laterality: N/A;  . HERNIA REPAIR  08-16-10  . LEFT HEART CATHETERIZATION WITH CORONARY ANGIOGRAM N/A 12/05/2011   Procedure: LEFT HEART CATHETERIZATION WITH CORONARY ANGIOGRAM;  Surgeon: Belva Crome  III, MD;  Location: [facility];  Service: Cardiovascular;  Laterality: N/A;  . TOTAL KNEE ARTHROPLASTY Right 02/24/2013   Procedure: RIGHT TOTAL KNEE ARTHROPLASTY;  Surgeon: Johnn Hai, MD;  Location: [facility];  Service: Orthopedics;  Laterality: Right;  . TUBAL LIGATION    . varicose vein surgery b/l legs       OB History    Gravida  2   Para  2   Term  2   Preterm      AB      Living  2     SAB       TAB      Ectopic      Multiple      Live Births               Home Medications    Prior to Admission medications   Medication Sig Start Date End Date Taking? Authorizing Provider  acetaminophen (TYLENOL) 500 MG tablet Take 1,000-1,500 mg by mouth every 6 (six) hours as needed (For knee pain.).    [provider]  amLODipine (NORVASC) 5 MG tablet TAKE ONE TABLET BY MOUTH DAILY 09/24/17   Belva Crome, MD  aspirin EC 81 MG tablet Take 81 mg by mouth daily.    [provider]  busPIRone (BUSPAR) 15 MG tablet  07/07/16   [provider]  ciprofloxacin (CIPRO) 250 MG tablet Take 1 tablet (250 mg total) by mouth 2 (two) times daily. 11/10/17   Mosie Lukes, MD  clonazePAM (KLONOPIN) 0.5 MG tablet Take 0.5 mg by mouth 2 (two) times daily.     [provider]  colesevelam (WELCHOL) 625 MG tablet Take 3 tablets (1,875 mg total) by mouth 2 (two) times daily with a meal. 09/08/16   Mosie Lukes, MD  cyclobenzaprine (FLEXERIL) 10 MG tablet Take 10 mg by mouth 3 (three) times daily as needed for muscle spasms.    [provider]  enoxaparin (LOVENOX) 120 MG/0.8ML injection Inject 0.8 mLs (120 mg total) into the skin every 12 (twelve) hours. 01/29/18   Belva Crome, MD  febuxostat (ULORIC) 40 MG tablet Take 1 tablet (40 mg total) by mouth daily. 05/15/17   Mosie Lukes, MD  fenofibrate 160 MG tablet Take 160 mg by mouth daily.    [provider]  furosemide (LASIX) 40 MG tablet TAKE ONE TABLET BY MOUTH DAILY *PLEASE KEEP UPCOMING APPOINTMENT* 10/06/17   Belva Crome, MD  glucose blood (ONE TOUCH ULTRA TEST) test strip UAD qd for blood sugar Dx: E11.9 02/10/17   Mosie Lukes, MD  HYDROcodone-acetaminophen (NORCO) 5-325 MG tablet Take 1 tablet by mouth every 6 (six) hours as needed for moderate pain. 01/29/17   Mosie Lukes, MD  lamoTRIgine (LAMICTAL) 200 MG tablet Take 300 mg by mouth daily. Pt taking 1 and 1/2 tablet daily    [provider]  metoprolol succinate (TOPROL-XL) 25 MG 24 hr tablet Take 1 tablet (25 mg total) by mouth every morning. 01/04/18   Mosie Lukes, MD  ONE TOUCH LANCETS MISC Test once daily to check blood sugar. DX E11.9 04/26/15   Mosie Lukes, MD  pantoprazole (PROTONIX) 40 MG tablet Take 1 tablet (40 mg total) by mouth daily. 01/04/18   Mosie Lukes, MD  Polyethyl Glycol-Propyl Glycol (SYSTANE OP) Place 1 drop into both eyes at bedtime as needed (For dry eyes.).     [provider]  Probiotic  Product (ALIGN) 4 MG CAPS Take 4 mg by mouth daily.    [provider]  rOPINIRole (REQUIP) 0.25 MG tablet Take 1-2 tablets (0.25-0.5 mg total) by mouth at bedtime. 11/02/17   Mosie Lukes, MD  rosuvastatin (CRESTOR) 40 MG tablet TAKE ONE TABLET BY MOUTH DAILY 10/20/17   Mosie Lukes, MD  SEROQUEL XR 400 MG 24 hr tablet Take 400 mg by mouth at bedtime.  05/30/15   [provider]  SUMAtriptan (IMITREX) 50 MG tablet TAKE ONE TABLET EVERY 2 HOURS AS NEEDED FOR MIGRAINE. MAY REPEAT IN 2 HOURS IF HEADACHE PERSISTS OR RECURS. (MAX OF 2 TABLETS IN 24 HOURS) 12/01/17   Mosie Lukes, MD  tiZANidine (ZANAFLEX) 2 MG tablet Take 1 tablet (2 mg total) by mouth every 8 (eight) hours as needed for muscle spasms. 11/27/16   Mosie Lukes, MD  traMADol (ULTRAM) 50 MG tablet Take 1 tablet (50 mg total) by mouth every 8 (eight) hours as needed. 11/02/17   Mosie Lukes, MD  venlafaxine XR (EFFEXOR-XR) 150 MG 24 hr capsule Take 150 mg by mouth every morning.    [provider]  warfarin (COUMADIN) 5 MG tablet TAKE AS [place] 09/21/17   Belva Crome, MD  methimazole (TAPAZOLE) 5 MG tablet Take 1 tablet (5 mg total) by mouth 3 (three) times daily. 06/24/11 08/05/11  Renato Shin, MD    Family History Family History  Problem Relation Age of Onset  . Scoliosis Mother   . Arthritis Mother        Rheumatoid  . Aneurysm Mother        heart  . Alcohol abuse Father    . Depression Sister   . Depression Brother   . Alcohol abuse Brother   . Cancer Maternal Grandmother        colon  . Diabetes Maternal Grandfather   . Heart disease Maternal Grandfather   . Alcohol abuse Maternal Grandfather   . Depression Paternal Grandmother   . Diabetes Paternal Grandmother   . Depression Paternal Grandfather   . Diabetes Paternal Grandfather   . Depression Sister   . Alcohol abuse Brother   . Depression Brother   . Heart disease Brother     Social History Social History   Tobacco Use  . Smoking status: Former Smoker    Last attempt to quit: 05/26/1990    Years since quitting: 27.7  . Smokeless tobacco: Never Used  Substance Use Topics  . Alcohol use: Yes    Alcohol/week: 0.0 standard drinks    Comment: rare  . Drug use: No     Allergies   Mucinex [guaifenesin er]; Keflex [cephalexin]; and Sulfonamide derivatives   Review of Systems Review of Systems  All other systems reviewed and are negative.    Physical Exam Updated Vital Signs BP (!) 154/109 (BP Location: Right Arm)   Pulse 80   Temp 98.3 F (36.8 C) (Oral)   Resp 18   Ht 5\' 8"  (1.727 m)   Wt 108.9 kg   SpO2 93%   BMI 36.49 kg/m   Physical Exam  Constitutional: She appears well-developed and well-nourished. No distress.  Elderly female appears drowsy but arousable.  Nontoxic in appearance  HENT:  Head: Atraumatic.  Eyes: Conjunctivae are normal.  Neck: Neck supple.  Cardiovascular: Normal rate and regular rhythm.  Murmur heard. Pulmonary/Chest: Effort normal and breath sounds normal.  Abdominal: Soft.  Musculoskeletal: She exhibits tenderness (Left lower extremity:  Normal-appearing surgical scar noted to lateral ankle as well as medial region with surrounding edema and tenderness but no erythema and compartment of the leg is soft.  Dorsalis pedis pulse palpable with brisk cap refill).  Neurological: She is alert.  Skin: No rash noted.  Psychiatric: She has a normal mood  and affect.  Nursing note and vitals reviewed.    ED Treatments / Results  Labs (all labs ordered are listed, but only abnormal results are displayed) Labs Reviewed  CBC WITH DIFFERENTIAL/PLATELET  BASIC METABOLIC PANEL    EKG None  Radiology Dg Ankle Complete Left  Result Date: 02/03/2018 CLINICAL DATA:  LEFT ankle pain, post surgery EXAM: LEFT ANKLE COMPLETE - 3+ VIEW COMPARISON:  None FINDINGS: Osseous demineralization. Soft tissue swelling at the distal lower LEFT leg and ankle into foot. Screw tracks identified at the mid to distal fibula post removal of orthopedic hardware (lateral plate and screws) from fibula. Ankle joint space preserved. Syndesmotic screw track present. No acute fracture, dislocation, or bone destruction. Small medial malleolar spur noted. Mild spur formation at the dorsal margin of the TMT joints. IMPRESSION: Post hardware removal from the LEFT ankle. No acute abnormalities. Electronically Signed   By: Lavonia Dana M.D.   On: 02/03/2018 23:14    Procedures Procedures (including critical care time)  Medications Ordered in ED Medications  HYDROmorphone (DILAUDID) injection 1 mg (has no administration in time range)  oxyCODONE-acetaminophen (PERCOCET/ROXICET) 5-325 MG per tablet 1 tablet (1 tablet Oral Given 02/03/18 2221)  HYDROmorphone (DILAUDID) injection 1 mg (1 mg Intramuscular Given 02/03/18 2333)     Initial Impression / Assessment and Plan / ED Course  I have reviewed the triage vital signs and the nursing notes.  Pertinent labs & imaging results that were available during my care of the patient were reviewed by me and considered in my medical decision making (see chart for details).     BP (!) 144/52 (BP Location: Left Arm)   Pulse 68   Temp 98.3 F (36.8 C) (Oral)   Resp 16   Ht 5\' 8"  (1.727 m)   Wt 108.9 kg   SpO2 97%   BMI 36.49 kg/m    Final Clinical Impressions(s) / ED Diagnoses   Final diagnoses:  Post-operative pain    ED  Discharge Orders    None     10:13 PM Patient with recent hardware removal from her left ankle yesterday here with pain not adequately controlled with Vicodin.  On exam, surgical wound with expected appearance, she is neurovascularly intact.  She does not have any significant calf pain on exam and she is currently on Lovenox.  Will provide pain medication, obtain screening x-ray of the left ankle and will determine further management.  12:30 AM Patient received Percocet, as well as Dilaudid but states did not provide no improvement of her pain.  She does not feel comfortable going home.    I did offer further pain management as well as stronger pain medication along with bedside commode to go home with.  Pt requesting admission for pain control.    12:53 AM Appreciate consultation from orthopedist Dr. Cherly Beach who agrees to have pt admitted under Dr. Doran Durand and he will see pt tomorrow.  Dr. Cherly Beach will put in admission order.    5:55 AM There was a delay in admitting patient due to epic system that went off line.  Pt was monitored in the ER.  I have reached out to [place], [place] who agrees  to put in admission order now that Epic is accessible.    Domenic Moras, PA-C 02/04/18 0601    Tegeler, Gwenyth Allegra, MD 02/04/18 [phone]

## 2018-02-03 NOTE — ED Triage Notes (Signed)
Patient arrived with EMS from home reports persistent /worsening right ankle pain unrelieved by prescription pain medication ( Hydrocodone) , left ankle surgery by Dr. Wylene Simmer yesterday.

## 2018-02-04 ENCOUNTER — Other Ambulatory Visit: Payer: Self-pay

## 2018-02-04 DIAGNOSIS — Z90710 Acquired absence of both cervix and uterus: Secondary | ICD-10-CM | POA: Diagnosis not present

## 2018-02-04 DIAGNOSIS — F319 Bipolar disorder, unspecified: Secondary | ICD-10-CM | POA: Diagnosis present

## 2018-02-04 DIAGNOSIS — Z882 Allergy status to sulfonamides status: Secondary | ICD-10-CM | POA: Diagnosis not present

## 2018-02-04 DIAGNOSIS — I4891 Unspecified atrial fibrillation: Secondary | ICD-10-CM | POA: Diagnosis present

## 2018-02-04 DIAGNOSIS — Z9049 Acquired absence of other specified parts of digestive tract: Secondary | ICD-10-CM | POA: Diagnosis not present

## 2018-02-04 DIAGNOSIS — M109 Gout, unspecified: Secondary | ICD-10-CM | POA: Diagnosis present

## 2018-02-04 DIAGNOSIS — Z87440 Personal history of urinary (tract) infections: Secondary | ICD-10-CM | POA: Diagnosis not present

## 2018-02-04 DIAGNOSIS — M25579 Pain in unspecified ankle and joints of unspecified foot: Secondary | ICD-10-CM | POA: Diagnosis present

## 2018-02-04 DIAGNOSIS — Z952 Presence of prosthetic heart valve: Secondary | ICD-10-CM | POA: Diagnosis not present

## 2018-02-04 DIAGNOSIS — Z96651 Presence of right artificial knee joint: Secondary | ICD-10-CM | POA: Diagnosis present

## 2018-02-04 DIAGNOSIS — Z8249 Family history of ischemic heart disease and other diseases of the circulatory system: Secondary | ICD-10-CM | POA: Diagnosis not present

## 2018-02-04 DIAGNOSIS — Z7901 Long term (current) use of anticoagulants: Secondary | ICD-10-CM | POA: Diagnosis not present

## 2018-02-04 DIAGNOSIS — E1169 Type 2 diabetes mellitus with other specified complication: Secondary | ICD-10-CM | POA: Diagnosis present

## 2018-02-04 DIAGNOSIS — M25572 Pain in left ankle and joints of left foot: Secondary | ICD-10-CM | POA: Diagnosis present

## 2018-02-04 DIAGNOSIS — Z79899 Other long term (current) drug therapy: Secondary | ICD-10-CM | POA: Diagnosis not present

## 2018-02-04 DIAGNOSIS — I10 Essential (primary) hypertension: Secondary | ICD-10-CM | POA: Diagnosis present

## 2018-02-04 DIAGNOSIS — Z7982 Long term (current) use of aspirin: Secondary | ICD-10-CM | POA: Diagnosis not present

## 2018-02-04 DIAGNOSIS — Z881 Allergy status to other antibiotic agents status: Secondary | ICD-10-CM | POA: Diagnosis not present

## 2018-02-04 DIAGNOSIS — E782 Mixed hyperlipidemia: Secondary | ICD-10-CM | POA: Diagnosis present

## 2018-02-04 DIAGNOSIS — Z833 Family history of diabetes mellitus: Secondary | ICD-10-CM | POA: Diagnosis not present

## 2018-02-04 DIAGNOSIS — G8918 Other acute postprocedural pain: Secondary | ICD-10-CM | POA: Diagnosis present

## 2018-02-04 DIAGNOSIS — Z87891 Personal history of nicotine dependence: Secondary | ICD-10-CM | POA: Diagnosis not present

## 2018-02-04 DIAGNOSIS — E669 Obesity, unspecified: Secondary | ICD-10-CM | POA: Diagnosis present

## 2018-02-04 DIAGNOSIS — Z6836 Body mass index (BMI) 36.0-36.9, adult: Secondary | ICD-10-CM | POA: Diagnosis not present

## 2018-02-04 DIAGNOSIS — Z79891 Long term (current) use of opiate analgesic: Secondary | ICD-10-CM | POA: Diagnosis not present

## 2018-02-04 HISTORY — DX: Pain in unspecified ankle and joints of unspecified foot: M25.579

## 2018-02-04 LAB — BASIC METABOLIC PANEL
Anion gap: 10 (ref 5–15)
BUN: 10 mg/dL (ref 8–23)
CO2: 27 mmol/L (ref 22–32)
Calcium: 8.9 mg/dL (ref 8.9–10.3)
Chloride: 101 mmol/L (ref 98–111)
Creatinine, Ser: 1.05 mg/dL — ABNORMAL HIGH (ref 0.44–1.00)
GFR calc Af Amer: >60 mL/min (ref >60)
GFR calc non Af Amer: 54 mL/min — ABNORMAL LOW (ref >60)
Glucose, Bld: 172 mg/dL — ABNORMAL HIGH (ref 70–99)
Potassium: 3.6 mmol/L (ref 3.5–5.1)
Sodium: 138 mmol/L (ref 135–145)

## 2018-02-04 LAB — CBC WITH DIFFERENTIAL/PLATELET
Abs Immature Granulocytes: 0 10*3/uL (ref 0.0–0.1)
Basophils Absolute: 0.1 10*3/uL (ref 0.0–0.1)
Basophils Relative: 1 %
Eosinophils Absolute: 0.1 10*3/uL (ref 0.0–0.7)
Eosinophils Relative: 1 %
HCT: 37.9 % (ref 36.0–46.0)
Hemoglobin: 12.3 g/dL (ref 12.0–15.0)
Immature Granulocytes: 0 %
Lymphocytes Relative: 19 %
Lymphs Abs: 1.9 10*3/uL (ref 0.7–4.0)
MCH: 29.6 pg (ref 26.0–34.0)
MCHC: 32.5 g/dL (ref 30.0–36.0)
MCV: 91.3 fL (ref 78.0–100.0)
Monocytes Absolute: 1.1 10*3/uL — ABNORMAL HIGH (ref 0.1–1.0)
Monocytes Relative: 10 %
Neutro Abs: 6.9 10*3/uL (ref 1.7–7.7)
Neutrophils Relative %: 69 %
Platelets: 206 10*3/uL (ref 150–400)
RBC: 4.15 MIL/uL (ref 3.87–5.11)
RDW: 14.4 % (ref 11.5–15.5)
WBC: 10.1 10*3/uL (ref 4.0–10.5)

## 2018-02-04 LAB — CBC
HCT: 36.6 % (ref 36.0–46.0)
Hemoglobin: 11.8 g/dL — ABNORMAL LOW (ref 12.0–15.0)
MCH: 29.5 pg (ref 26.0–34.0)
MCHC: 32.2 g/dL (ref 30.0–36.0)
MCV: 91.5 fL (ref 78.0–100.0)
Platelets: 198 10*3/uL (ref 150–400)
RBC: 4 MIL/uL (ref 3.87–5.11)
RDW: 14.2 % (ref 11.5–15.5)
WBC: 9.3 10*3/uL (ref 4.0–10.5)

## 2018-02-04 LAB — PROTIME-INR
INR: 1.27
Prothrombin Time: 15.8 seconds — ABNORMAL HIGH (ref 11.4–15.2)

## 2018-02-04 MED ORDER — ENOXAPARIN SODIUM 120 MG/0.8ML ~~LOC~~ SOLN
110.0000 mg | Freq: Two times a day (BID) | SUBCUTANEOUS | Status: DC
  Administered 2018-02-04 – 2018-02-08 (×9): 110 mg via SUBCUTANEOUS
  Filled 2018-02-04 (×10): qty 0.73

## 2018-02-04 MED ORDER — CIPROFLOXACIN HCL 500 MG PO TABS: 250.0000 mg | ORAL_TABLET | Freq: Two times a day (BID) | ORAL | Status: DC

## 2018-02-04 MED ORDER — WARFARIN - PHARMACIST DOSING INPATIENT
Freq: Every day | Status: DC
  Administered 2018-02-05: 17:00:00

## 2018-02-04 MED ORDER — COLESEVELAM HCL 625 MG PO TABS
1875.0000 mg | ORAL_TABLET | Freq: Two times a day (BID) | ORAL | Status: DC
  Administered 2018-02-04 – 2018-02-08 (×10): 1875 mg via ORAL
  Filled 2018-02-04 (×10): qty 3

## 2018-02-04 MED ORDER — METOPROLOL SUCCINATE ER 25 MG PO TB24
25.0000 mg | ORAL_TABLET | Freq: Every morning | ORAL | Status: DC
  Administered 2018-02-04 – 2018-02-08 (×4): 25 mg via ORAL
  Filled 2018-02-04 (×5): qty 1

## 2018-02-04 MED ORDER — LAMOTRIGINE 150 MG PO TABS
300.0000 mg | ORAL_TABLET | Freq: Every day | ORAL | Status: DC
  Administered 2018-02-04 – 2018-02-08 (×5): 300 mg via ORAL
  Filled 2018-02-04 (×5): qty 2

## 2018-02-04 MED ORDER — DOCUSATE SODIUM 100 MG PO CAPS: 100.0000 mg | ORAL_CAPSULE | Freq: Two times a day (BID) | ORAL | 0 refills | Status: DC

## 2018-02-04 MED ORDER — HYDROMORPHONE HCL 1 MG/ML IJ SOLN
0.5000 mg | INTRAMUSCULAR | Status: DC | PRN
  Administered 2018-02-05 – 2018-02-06 (×4): 0.5 mg via INTRAVENOUS
  Filled 2018-02-04 (×4): qty 1

## 2018-02-04 MED ORDER — TIZANIDINE HCL 4 MG PO TABS
2.0000 mg | ORAL_TABLET | Freq: Three times a day (TID) | ORAL | Status: DC | PRN
  Administered 2018-02-06: 2 mg via ORAL
  Filled 2018-02-04 (×2): qty 1

## 2018-02-04 MED ORDER — OXYCODONE HCL 5 MG PO TABS
5.0000 mg | ORAL_TABLET | ORAL | Status: DC | PRN
  Administered 2018-02-04 – 2018-02-08 (×11): 5 mg via ORAL
  Filled 2018-02-04 (×11): qty 1

## 2018-02-04 MED ORDER — CLONAZEPAM 0.5 MG PO TABS
0.5000 mg | ORAL_TABLET | Freq: Two times a day (BID) | ORAL | Status: DC
  Administered 2018-02-04 – 2018-02-08 (×8): 0.5 mg via ORAL
  Filled 2018-02-04 (×9): qty 1

## 2018-02-04 MED ORDER — ROPINIROLE HCL 0.25 MG PO TABS
0.2500 mg | ORAL_TABLET | Freq: Every day | ORAL | Status: DC
  Administered 2018-02-04: 0.25 mg via ORAL
  Administered 2018-02-05 – 2018-02-07 (×3): 0.5 mg via ORAL
  Filled 2018-02-04 (×5): qty 2

## 2018-02-04 MED ORDER — BUSPIRONE HCL 10 MG PO TABS
15.0000 mg | ORAL_TABLET | Freq: Two times a day (BID) | ORAL | Status: DC
  Administered 2018-02-04 – 2018-02-08 (×9): 15 mg via ORAL
  Filled 2018-02-04: qty 2
  Filled 2018-02-04: qty 3
  Filled 2018-02-04 (×7): qty 2

## 2018-02-04 MED ORDER — ACETAMINOPHEN 325 MG PO TABS
650.0000 mg | ORAL_TABLET | Freq: Four times a day (QID) | ORAL | Status: DC | PRN
  Administered 2018-02-06 – 2018-02-08 (×5): 650 mg via ORAL
  Filled 2018-02-04 (×5): qty 2

## 2018-02-04 MED ORDER — ALIGN 4 MG PO CAPS: 4.0000 mg | ORAL_CAPSULE | Freq: Every day | ORAL | Status: DC

## 2018-02-04 MED ORDER — PANTOPRAZOLE SODIUM 40 MG PO TBEC
40.0000 mg | DELAYED_RELEASE_TABLET | Freq: Every day | ORAL | Status: DC
  Administered 2018-02-04 – 2018-02-08 (×5): 40 mg via ORAL
  Filled 2018-02-04 (×5): qty 1

## 2018-02-04 MED ORDER — OXYCODONE HCL 5 MG PO TABS: 5.0000 mg | ORAL_TABLET | ORAL | 0 refills | Status: AC | PRN

## 2018-02-04 MED ORDER — AMLODIPINE BESYLATE 5 MG PO TABS
5.0000 mg | ORAL_TABLET | Freq: Every day | ORAL | Status: DC
  Administered 2018-02-04 – 2018-02-07 (×4): 5 mg via ORAL
  Filled 2018-02-04 (×6): qty 1

## 2018-02-04 MED ORDER — SENNA 8.6 MG PO TABS: 2.0000 | ORAL_TABLET | Freq: Two times a day (BID) | ORAL | 0 refills | Status: DC

## 2018-02-04 MED ORDER — FUROSEMIDE 20 MG PO TABS
40.0000 mg | ORAL_TABLET | Freq: Every day | ORAL | Status: DC
  Administered 2018-02-04 – 2018-02-08 (×5): 40 mg via ORAL
  Filled 2018-02-04 (×5): qty 2

## 2018-02-04 MED ORDER — WARFARIN SODIUM 5 MG PO TABS
5.0000 mg | ORAL_TABLET | Freq: Every day | ORAL | Status: DC
  Administered 2018-02-04: 5 mg via ORAL
  Filled 2018-02-04: qty 1

## 2018-02-04 MED ORDER — HYDROMORPHONE HCL 1 MG/ML IJ SOLN: 1.0000 mg | Freq: Once | INTRAMUSCULAR | Status: DC

## 2018-02-04 MED ORDER — QUETIAPINE FUMARATE ER 400 MG PO TB24
400.0000 mg | ORAL_TABLET | Freq: Every day | ORAL | Status: DC
  Administered 2018-02-05 – 2018-02-07 (×3): 400 mg via ORAL
  Filled 2018-02-04 (×5): qty 1

## 2018-02-04 MED ORDER — ROSUVASTATIN CALCIUM 40 MG PO TABS
40.0000 mg | ORAL_TABLET | Freq: Every day | ORAL | Status: DC
  Administered 2018-02-04 – 2018-02-08 (×5): 40 mg via ORAL
  Filled 2018-02-04: qty 4
  Filled 2018-02-04 (×3): qty 1
  Filled 2018-02-04 (×2): qty 4
  Filled 2018-02-04: qty 1
  Filled 2018-02-04 (×2): qty 4
  Filled 2018-02-04: qty 1

## 2018-02-04 MED ORDER — ASPIRIN EC 81 MG PO TBEC
81.0000 mg | DELAYED_RELEASE_TABLET | Freq: Every day | ORAL | Status: DC
  Administered 2018-02-04 – 2018-02-08 (×5): 81 mg via ORAL
  Filled 2018-02-04 (×5): qty 1

## 2018-02-04 MED ORDER — FENOFIBRATE 160 MG PO TABS
160.0000 mg | ORAL_TABLET | Freq: Every day | ORAL | Status: DC
  Administered 2018-02-04 – 2018-02-08 (×5): 160 mg via ORAL
  Filled 2018-02-04 (×5): qty 1

## 2018-02-04 MED ORDER — VENLAFAXINE HCL ER 150 MG PO CP24
150.0000 mg | ORAL_CAPSULE | Freq: Every morning | ORAL | Status: DC
  Administered 2018-02-04 – 2018-02-08 (×5): 150 mg via ORAL
  Filled 2018-02-04 (×5): qty 1

## 2018-02-04 MED ORDER — FEBUXOSTAT 40 MG PO TABS
40.0000 mg | ORAL_TABLET | Freq: Every day | ORAL | Status: DC
  Administered 2018-02-05 – 2018-02-08 (×4): 40 mg via ORAL
  Filled 2018-02-04 (×6): qty 1

## 2018-02-04 MED ORDER — RISAQUAD PO CAPS
1.0000 | ORAL_CAPSULE | Freq: Every day | ORAL | Status: DC
  Administered 2018-02-04 – 2018-02-08 (×5): 1 via ORAL
  Filled 2018-02-04 (×5): qty 1

## 2018-02-04 MED ORDER — POLYVINYL ALCOHOL 1.4 % OP SOLN: Freq: Every evening | OPHTHALMIC | Status: DC | PRN

## 2018-02-04 NOTE — Progress Notes (Signed)
ANTICOAGULATION CONSULT NOTE - Initial Consult  Pharmacy Consult for Lovenox and restart warfarin Indication: afib/St Jude AVR  Allergies  Allergen Reactions  . Mucinex [Guaifenesin Er] Other (See Comments)    Severe headaches   . Keflex [Cephalexin] Other (See Comments)    Headaches and dizziness  . Sulfonamide Derivatives Other (See Comments)    Headaches     Patient Measurements: Height: 5\' 8"  (172.7 cm) Weight: 240 lb (108.9 kg) IBW/kg (Calculated) : 63.9  Vital Signs: Temp: 98.7 F (37.1 C) (09/12 0829) Temp Source: Oral (09/12 0829) BP: 137/64 (09/12 0829) Pulse Rate: 73 (09/12 0829)  Labs: Recent Labs    02/04/18 0821 02/04/18 0911  HGB 12.3 11.8*  HCT 37.9 36.6  PLT 206 198  LABPROT  --  15.8*  INR  --  1.27  CREATININE 1.05*  --     Estimated Creatinine Clearance: 67.2 mL/min (A) (by C-G formula based on SCr of 1.05 mg/dL (H)).   Medical History: Past Medical History:  Diagnosis Date  . ANEMIA 05/28/2010  . AORTIC VALVE REPLACEMENT, HX OF 03/11/2010   Mechanical prosthesis  . BIPOLAR AFFECTIVE DISORDER 03/11/2010  . Depression   . Diverticulitis 12/18/2010  . FIBROIDS, UTERUS 03/11/2010  . Gout 09/04/2016  . Grave's disease 8-12  . Hyperlipidemia associated with type 2 diabetes mellitus ([place]) 06/05/2016  . Hypertension   . Low back pain 11/02/2017  . Migraine 06/05/2016  . Mixed hyperlipidemia 03/11/2010  . Obesity   . UTI (lower urinary tract infection) 08/14/2011    Medications:  Medications Prior to Admission  Medication Sig Dispense Refill Last Dose  . acetaminophen (TYLENOL) 500 MG tablet Take 1,000-1,500 mg by mouth every 6 (six) hours as needed (For knee pain.).   Unk at prn  . amLODipine (NORVASC) 5 MG tablet TAKE ONE TABLET BY MOUTH DAILY 90 tablet 3 02/03/2018  . aspirin EC 81 MG tablet Take 81 mg by mouth daily.   02/03/2018 at Unknown time  . clonazePAM (KLONOPIN) 0.5 MG tablet Take 0.5 mg by mouth at bedtime as needed (Insomnia).     02/02/2018  . cyclobenzaprine (FLEXERIL) 10 MG tablet Take 10 mg by mouth 3 (three) times daily as needed for muscle spasms.   unk at prn  . enoxaparin (LOVENOX) 120 MG/0.8ML injection Inject 0.8 mLs (120 mg total) into the skin every 12 (twelve) hours. 20 Syringe 0 02/03/2018 at Unknown time  . fenofibrate 160 MG tablet Take 160 mg by mouth daily.   02/03/2018 at Unknown time  . furosemide (LASIX) 40 MG tablet TAKE ONE TABLET BY MOUTH DAILY *PLEASE KEEP UPCOMING APPOINTMENT* (Patient taking differently: Take 40 mg by mouth daily. ) 30 tablet 11 02/03/2018 at Unknown time  . HYDROcodone-acetaminophen (NORCO) 5-325 MG tablet Take 1 tablet by mouth every 6 (six) hours as needed for moderate pain. 10 tablet 0 02/02/2018 at prn  . lamoTRIgine (LAMICTAL) 200 MG tablet Take 100-200 mg by mouth See admin instructions. Take 100 mg by mouth in the morning and 200 mg by mouth at bedtime   02/03/2018 at Unknown time  . metoprolol succinate (TOPROL-XL) 25 MG 24 hr tablet Take 1 tablet (25 mg total) by mouth every morning. 90 tablet 0 02/03/2018 at Unknown time  . OXcarbazepine (TRILEPTAL) 150 MG tablet Take 150 mg by mouth 2 (two) times daily.   02/03/2018 at Unknown time  . pantoprazole (PROTONIX) 40 MG tablet Take 1 tablet (40 mg total) by mouth daily. 90 tablet 3 02/03/2018 at Unknown  time  . Polyethyl Glycol-Propyl Glycol (SYSTANE OP) Place 1 drop into both eyes at bedtime as needed (For dry eyes.).    02/01/2018  . rOPINIRole (REQUIP) 0.25 MG tablet Take 1-2 tablets (0.25-0.5 mg total) by mouth at bedtime. 60 tablet 2 02/02/2018  . rosuvastatin (CRESTOR) 40 MG tablet TAKE ONE TABLET BY MOUTH DAILY (Patient taking differently: Take 40 mg by mouth daily. ) 90 tablet 3   . SUMAtriptan (IMITREX) 50 MG tablet TAKE ONE TABLET EVERY 2 HOURS AS NEEDED FOR MIGRAINE. MAY REPEAT IN 2 HOURS IF HEADACHE PERSISTS OR RECURS. (MAX OF 2 TABLETS IN 24 HOURS) (Patient taking differently: Take 50 mg by mouth every 2 (two) hours as needed for  migraine. ) 10 tablet 0 02/01/2018  . tiZANidine (ZANAFLEX) 2 MG tablet Take 1 tablet (2 mg total) by mouth every 8 (eight) hours as needed for muscle spasms. 40 tablet 2 02/03/2018 at Unknown time  . traMADol (ULTRAM) 50 MG tablet Take 1 tablet (50 mg total) by mouth every 8 (eight) hours as needed. 30 tablet 0 02/03/2018 at Unknown time  . venlafaxine XR (EFFEXOR-XR) 150 MG 24 hr capsule Take 150 mg by mouth every morning.   02/03/2018 at Unknown time  . warfarin (COUMADIN) 5 MG tablet TAKE AS [place] (Patient taking differently: Take 5 mg by mouth daily at 6 PM. TAKE AS [facility]RECED BY [facility]) 90 tablet 1 02/03/2018 at Unknown time  . glucose blood (ONE TOUCH ULTRA TEST) test strip UAD qd for blood sugar Dx: E11.9 100 each 12 Taking  . ONE TOUCH LANCETS MISC Test once daily to check blood sugar. DX E11.9 100 each 3 Taking  . Probiotic Product (ALIGN) 4 MG CAPS Take 4 mg by mouth daily.   Not Taking at Unknown time    Assessment: 67 y.o. F s/p L tib hardware removal 9/11. Pt on warfarin PTA for afib/St Jude AVR with goal INR 2.5-3.5. Warfarin held 5 days pre-op and Lovenox bridge started (120mg  SQ q12h) - last dose Lovenox was 9/11 in the morning. CBC ok at baseline. INR 1.27 (subtherapeutic).  Home dose warfarin-  2.5mg  daily except for 5mg  on Tues and Sat (INR 3.4)  Goal of Therapy:  INR 2.5-3.5 Monitor platelets by anticoagulation protocol: Yes   Plan:  Continue Lovenox 110mg  SQ q12h until INR therapeutic x 48 hours Give warfarin 5 mg po x 1 Monitor daily INR, CBC, clinical course, s/sx of bleed, PO intake, DDI    Thank you for allowing [place] to participate in this patients care.   Jens Som, PharmD Please utilize [facility] (under [place]) for appropriate number for your unit pharmacist. 02/04/2018 5:37 PM

## 2018-02-04 NOTE — ED Notes (Signed)
Charting resumed after downtime: Patient resting with no distress, respirations unlabored , IV site intact , pt. waiting for admitting MD, she received Dilaudid 1 mg IM with relief .

## 2018-02-04 NOTE — H&P (Signed)
[name] is an 67 y.o. female.   Chief Complaint: Left ankle pain HPI: Patient presents to the [facility] for Left ankle pain. She underwent surgical removal of painful hardware by dr.Hewitt yesterday. Despite Post-op medication she has been unable to get relief. She was taking hydrocodone every 4 hours.  Denies any injury. Denies Numbness or tingling. No fever or chills.  She is on a Lovenox bridge back to warfarin for history of mechanical heart valve replacement. No CP or SOB.  Past Medical History:  Diagnosis Date  . ANEMIA 05/28/2010  . AORTIC VALVE REPLACEMENT, HX OF 03/11/2010   Mechanical prosthesis  . BIPOLAR AFFECTIVE DISORDER 03/11/2010  . Depression   . Diverticulitis 12/18/2010  . FIBROIDS, UTERUS 03/11/2010  . Gout 09/04/2016  . Grave's disease 8-12  . Hyperlipidemia associated with type 2 diabetes mellitus ([place]) 06/05/2016  . Hypertension   . Low back pain 11/02/2017  . Migraine 06/05/2016  . Mixed hyperlipidemia 03/11/2010  . Obesity   . UTI (lower urinary tract infection) 08/14/2011    Past Surgical History:  Procedure Laterality Date  . ABDOMINAL HYSTERECTOMY  ?1998   sb/l spo, total for fibroids/heavy bleeding  . AORTIC VALVE REPLACEMENT  2011   Mechanical prosthesis, [employer]  . APPENDECTOMY     Required revision for EColi infection, required recurrent packing  . bowel obstruction     Requiring adhesions to be removed  . CHOLECYSTECTOMY    . COLONOSCOPY WITH PROPOFOL N/A 11/01/2015   Procedure: COLONOSCOPY WITH PROPOFOL;  Surgeon: Milus Banister, MD;  Location: [facility];  Service: Endoscopy;  Laterality: N/A;  . CYSTOCELE REPAIR N/A 10/16/2015   Procedure: ANTERIOR REPAIR (CYSTOCELE);  Surgeon: Donnamae Jude, MD;  Location: [facility];  Service: Gynecology;  Laterality: N/A;  . HERNIA REPAIR  08-16-10  . LEFT HEART CATHETERIZATION WITH CORONARY ANGIOGRAM N/A 12/05/2011   Procedure: LEFT HEART CATHETERIZATION WITH CORONARY ANGIOGRAM;  Surgeon: Sinclair Grooms, MD;   Location: [facility] C[facility];  Service: Cardiovascular;  Laterality: N/A;  . TOTAL KNEE ARTHROPLASTY Right 02/24/2013   Procedure: RIGHT TOTAL KNEE ARTHROPLASTY;  Surgeon: Johnn Hai, MD;  Location: [facility];  Service: Orthopedics;  Laterality: Right;  . TUBAL LIGATION    . varicose vein surgery b/l legs      Family History  Problem Relation Age of Onset  . Scoliosis Mother   . Arthritis Mother        Rheumatoid  . Aneurysm Mother        heart  . Alcohol abuse Father   . Depression Sister   . Depression Brother   . Alcohol abuse Brother   . Cancer Maternal Grandmother        colon  . Diabetes Maternal Grandfather   . Heart disease Maternal Grandfather   . Alcohol abuse Maternal Grandfather   . Depression Paternal Grandmother   . Diabetes Paternal Grandmother   . Depression Paternal Grandfather   . Diabetes Paternal Grandfather   . Depression Sister   . Alcohol abuse Brother   . Depression Brother   . Heart disease Brother    Social History:  reports that she quit smoking about 27 years ago. She has never used smokeless tobacco. She reports that she drinks alcohol. She reports that she does not use drugs.  Allergies:  Allergies  Allergen Reactions  . Mucinex [Guaifenesin Er] Other (See Comments)    Severe Headaches  . Keflex [Cephalexin] Other (See Comments)    Headaches,Dizziness  .  Sulfonamide Derivatives Other (See Comments)    headaches     (Not in a hospital admission)  No results found for this or any previous visit (from the past 48 hour(s)). Dg Ankle Complete Left  Result Date: 02/03/2018 CLINICAL DATA:  LEFT ankle pain, post surgery EXAM: LEFT ANKLE COMPLETE - 3+ VIEW COMPARISON:  None FINDINGS: Osseous demineralization. Soft tissue swelling at the distal lower LEFT leg and ankle into foot. Screw tracks identified at the mid to distal fibula post removal of orthopedic hardware (lateral plate and screws) from fibula. Ankle joint space preserved. Syndesmotic screw  track present. No acute fracture, dislocation, or bone destruction. Small medial malleolar spur noted. Mild spur formation at the dorsal margin of the TMT joints. IMPRESSION: Post hardware removal from the LEFT ankle. No acute abnormalities. Electronically Signed   By: Lavonia Dana M.D.   On: 02/03/2018 23:14    Review of Systems  Constitutional: Negative.   HENT: Negative.   Eyes: Negative.   Respiratory: Negative.   Cardiovascular: Negative.   Gastrointestinal: Positive for heartburn.  Genitourinary: Negative.   Musculoskeletal: Positive for joint pain.  Skin: Negative.   Neurological: Negative.   Endo/Heme/Allergies: Negative.     Blood pressure (!) 144/52, pulse 68, temperature 98.3 F (36.8 C), temperature source Oral, resp. rate 16, height 5\' 8"  (1.727 m), weight 108.9 kg, SpO2 97 %. Physical Exam  Constitutional: She is oriented to person, place, and time. She appears well-developed.  HENT:  Head: Normocephalic.  Eyes: EOM are normal.  Neck: Normal range of motion.  Cardiovascular: Normal rate and intact distal pulses.  Murmur heard. Respiratory: Effort normal.  GI: Soft.  Musculoskeletal:  Left ankle surgical incisions are well approximated. No spreading redness or purulent drainage. Edema is noted. Limited ROm due to pain. sensation is intact to touch. Compartments are soft and non tender. 2+ pedal and dorsalis pulses. Dressing not inplace.  Neurological: She is alert and oriented to person, place, and time.  Skin: Skin is warm and dry.     Assessment/Plan Left ankle pain post-op:  Admit for pain management NWB LLE Lovenox bridge to warfarin will hold for now Dr. Doran Durand to be updated and for further management   Lajean Manes, PA-C 02/04/2018, 6:11 AM

## 2018-02-04 NOTE — Discharge Instructions (Addendum)
Cynthia Simmer, MD [place]  Please read the following information regarding your care after surgery.  Medications  You only need a prescription for the narcotic pain medicine (ex. oxycodone, Percocet, Norco).  All of the other medicines listed below are available over the counter. X acetominophen (Tylenol) 650 mg every 4-6 hours as you need for minor to moderate pain X oxycodone as prescribed for severe pain  Narcotic pain medicine (ex. oxycodone, Percocet, Vicodin) will cause constipation.  To prevent this problem, take the following medicines while you are taking any pain medicine. X docusate sodium (Colace) 100 mg twice a day X senna (Senokot) 2 tablets twice a day     Weight Bearing X Bear weight when you are able on your operated leg or foot.   Cast / Splint / Dressing X Keep your splint, cast or dressing clean and dry.  Dont put anything (coat hanger, pencil, etc) down inside of it.  If it gets damp, use a hair dryer on the cool setting to dry it.  If it gets soaked, call the office to schedule an appointment for a cast change.   After your dressing, cast or splint is removed; you may shower, but do not soak or scrub the wound.  Allow the water to run over it, and then gently pat it dry.  Swelling It is normal for you to have swelling where you had surgery.  To reduce swelling and pain, keep your toes above your nose for at least 3 days after surgery.  It may be necessary to keep your foot or leg elevated for several weeks.  If it hurts, it should be elevated.  Follow Up Call my office at [phone] when you are discharged from the hospital or surgery center to schedule an appointment to be seen two weeks after surgery.  Call my office at [phone] if you develop a fever >101.5 F, nausea, vomiting, bleeding from the surgical site or severe pain.

## 2018-02-04 NOTE — Progress Notes (Addendum)
ANTICOAGULATION CONSULT NOTE - Initial Consult  Pharmacy Consult for Lovenox Indication: afib/[employer] Jude AVR  Allergies  Allergen Reactions  . Mucinex [Guaifenesin Er] Other (See Comments)    Severe headaches   . Keflex [Cephalexin] Other (See Comments)    Headaches and dizziness  . Sulfonamide Derivatives Other (See Comments)    Headaches     Patient Measurements: Height: 5\' 8"  (172.7 cm) Weight: 240 lb (108.9 kg) IBW/kg (Calculated) : 63.9  Vital Signs: Temp: 98.3 F (36.8 C) (09/11 2115) Temp Source: Oral (09/11 2115) BP: 120/65 (09/12 0730) Pulse Rate: 73 (09/12 0730)  Labs: No results for input(s): HGB, HCT, PLT, APTT, LABPROT, INR, HEPARINUNFRC, HEPRLOWMOCWT, CREATININE, CKTOTAL, CKMB, TROPONINI in the last 72 hours.  CrCl cannot be calculated (Patient's most recent lab result is older than the maximum 21 days allowed.).   Medical History: Past Medical History:  Diagnosis Date  . ANEMIA 05/28/2010  . AORTIC VALVE REPLACEMENT, HX OF 03/11/2010   Mechanical prosthesis  . BIPOLAR AFFECTIVE DISORDER 03/11/2010  . Depression   . Diverticulitis 12/18/2010  . FIBROIDS, UTERUS 03/11/2010  . Gout 09/04/2016  . Grave's disease 8-12  . Hyperlipidemia associated with type 2 diabetes mellitus ([place]) 06/05/2016  . Hypertension   . Low back pain 11/02/2017  . Migraine 06/05/2016  . Mixed hyperlipidemia 03/11/2010  . Obesity   . UTI (lower urinary tract infection) 08/14/2011    Medications:  Medications Prior to Admission  Medication Sig Dispense Refill Last Dose  . acetaminophen (TYLENOL) 500 MG tablet Take 1,000-1,500 mg by mouth every 6 (six) hours as needed (For knee pain.).   Unk at [employer]  . amLODipine (NORVASC) 5 MG tablet TAKE ONE TABLET BY MOUTH DAILY 90 tablet 3   . aspirin EC 81 MG tablet Take 81 mg by mouth daily.   Taking  . busPIRone (BUSPAR) 15 MG tablet    Taking  . ciprofloxacin (CIPRO) 250 MG tablet Take 1 tablet (250 mg total) by mouth 2 (two) times daily. 10  tablet 0   . clonazePAM (KLONOPIN) 0.5 MG tablet Take 0.5 mg by mouth 2 (two) times daily.    Taking  . colesevelam (WELCHOL) 625 MG tablet Take 3 tablets (1,875 mg total) by mouth 2 (two) times daily with a meal. 180 tablet 3 Unknown  . cyclobenzaprine (FLEXERIL) 10 MG tablet Take 10 mg by mouth 3 (three) times daily as needed for muscle spasms.   Taking  . enoxaparin (LOVENOX) 120 MG/0.8ML injection Inject 0.8 mLs (120 mg total) into the skin every 12 (twelve) hours. 20 Syringe 0   . febuxostat (ULORIC) 40 MG tablet Take 1 tablet (40 mg total) by mouth daily. 90 tablet 1 Taking  . fenofibrate 160 MG tablet Take 160 mg by mouth daily.   Taking  . furosemide (LASIX) 40 MG tablet TAKE ONE TABLET BY MOUTH DAILY *PLEASE KEEP UPCOMING APPOINTMENT* 30 tablet 11   . glucose blood (ONE TOUCH ULTRA TEST) test strip UAD qd for blood sugar Dx: E11.9 100 each 12 Taking  . HYDROcodone-acetaminophen (NORCO) 5-325 MG tablet Take 1 tablet by mouth every 6 (six) hours as needed for moderate pain. 10 tablet 0 Taking  . lamoTRIgine (LAMICTAL) 200 MG tablet Take 300 mg by mouth daily. Pt taking 1 and 1/2 tablet daily   Taking  . metoprolol succinate (TOPROL-XL) 25 MG 24 hr tablet Take 1 tablet (25 mg total) by mouth every morning. 90 tablet 0   . ONE TOUCH LANCETS MISC Test once  daily to check blood sugar. DX E11.9 100 each 3 Taking  . pantoprazole (PROTONIX) 40 MG tablet Take 1 tablet (40 mg total) by mouth daily. 90 tablet 3   . Polyethyl Glycol-Propyl Glycol (SYSTANE OP) Place 1 drop into both eyes at bedtime as needed (For dry eyes.).    Taking  . Probiotic Product (ALIGN) 4 MG CAPS Take 4 mg by mouth daily.   Not Taking  . rOPINIRole (REQUIP) 0.25 MG tablet Take 1-2 tablets (0.25-0.5 mg total) by mouth at bedtime. 60 tablet 2   . rosuvastatin (CRESTOR) 40 MG tablet TAKE ONE TABLET BY MOUTH DAILY 90 tablet 3   . SEROQUEL XR 400 MG 24 hr tablet Take 400 mg by mouth at bedtime.   1 Taking  . SUMAtriptan (IMITREX)  50 MG tablet TAKE ONE TABLET EVERY 2 HOURS AS NEEDED FOR MIGRAINE. MAY REPEAT IN 2 HOURS IF HEADACHE PERSISTS OR RECURS. (MAX OF 2 TABLETS IN 24 HOURS) 10 tablet 0   . tiZANidine (ZANAFLEX) 2 MG tablet Take 1 tablet (2 mg total) by mouth every 8 (eight) hours as needed for muscle spasms. 40 tablet 2 Taking  . traMADol (ULTRAM) 50 MG tablet Take 1 tablet (50 mg total) by mouth every 8 (eight) hours as needed. 30 tablet 0   . venlafaxine XR (EFFEXOR-XR) 150 MG 24 hr capsule Take 150 mg by mouth every morning.   Taking  . warfarin (COUMADIN) 5 MG tablet TAKE AS DIRECED BY [facility] 90 tablet 1     Assessment: 67 y.o. F s/p L tib hardware removal 9/11. Pt on coumadin PTA for afib/[employer] Jude AVR with goal INR 2.5-3.5. Coumadin held 5 days pre-op and Lovenox bridge started (120mg  SQ q12h) - pt states that last dose she took was 9/11 in the morning. CBC ok at baseline. INR 1.27 (subtherapeutic). Home dose coumadin -  2.5mg  daily except for 5mg  on Tues and Sat (INR 3.4)  Goal of Therapy:  INR 2.5-3.5 Monitor platelets by anticoagulation protocol: Yes   Plan:  Lovenox 110mg  SQ q12h F/u CBC q72h while on Lovenox Check daily INR F/u reinitiation of Coumadin - noted plan d/c home today or tomorrow  Sherlon Handing, PharmD, BCPS Clinical pharmacist  **Pharmacist phone directory can now be found on [email or web address] ([email or web address]).  Listed under [place]. 02/04/2018,8:22 AM

## 2018-02-04 NOTE — Progress Notes (Addendum)
Subjective:     Patient reports pain as mild to moderate.  Reports that pain is much improved, but that she is anxious about going home tonight.  States that she would like to go home in the morning.  Tolerating POs well.  Admits to flatus.  Denies fever, chills, N/V, CP, SOB.  Objective:   VITALS:  Temp:  [98.3 F (36.8 C)-98.7 F (37.1 C)] 98.7 F (37.1 C) (09/12 0829) Pulse Rate:  [25-80] 73 (09/12 0829) Resp:  [16-18] 18 (09/12 0829) BP: (120-154)/(46-109) 137/64 (09/12 0829) SpO2:  [88 %-97 %] 93 % (09/12 0829) Weight:  [108.9 kg] 108.9 kg (09/11 2115)  General: WDWN patient in NAD. Psych:  Appropriate mood and affect. Neuro:  A&O x 3, Moving all extremities, sensation intact to light touch HEENT:  EOMs intact Chest:  Even non-labored respirations Skin: Dressing C/D/I, no rashes or lesions Extremities: warm/dry, mild edema to L LE, no erythema or echymosis.  No lymphadenopathy. Pulses: Popliteus 2+ MSK:  ROM: EHL/FHL intact, MMT: able to perform quad set, (-) Homan's    LABS Recent Labs    02/04/18 0821 02/04/18 0911  HGB 12.3 11.8*  WBC 10.1 9.3  PLT 206 198   Recent Labs    02/04/18 0821  NA 138  K 3.6  CL 101  CO2 27  BUN 10  CREATININE 1.05*  GLUCOSE 172*   Recent Labs    02/04/18 0911  INR 1.27     Assessment/Plan:    S/P Left ankle hardware removal Intractable pain following surgery  WBAT L LE Bridge lovenox to coumadin per pharmacy recommendation Continue pain management Patient's insurance dictates that patient must stay 2 midnights upon admission. Rx for oxycodone on chart. Patient to follow up in 2 weeks for outpatient appointment with Dr. Annia Friendly [facility] Office:  [phone]

## 2018-02-04 NOTE — Discharge Summary (Signed)
Physician Discharge Summary  Patient ID: [name] MRN: [id number] DOB/AGE: 29-Jul-1950 67 y.o.  Admit date: 02/03/2018 Discharge date: 02/08/2018  Admission Diagnoses: S/P left ankle hardware removal by Dr. Doran Durand 02/02/18; intractable ankle pain following surgery; hyperlipidemia; bipolar; hx of diverticulitis; hx of constipation; hx of mechanical aortic valve replacement; hyperthyroidism; renal insufficiency; anemia; hx of UTI; long use of anticoagulant therapy; A-fib; HTN; DM type II; hx of migraine; nocturia; LBP  Discharge Diagnoses:  Active Problems:   Ankle pain same as above  Discharged Condition: stable  Hospital Course: Patient presented to [facility] ED on 02/04/18 with severe L ankle pain.  She underwent Left ankle painful hardware removal by Dr. Wylene Simmer on 02/02/18.  She was provided a Rx for hydrocodone 5/325mg  2 tablets q 4-6 hrs prn pain.  She reports that this did not help her pain at all.  Her pain was so severe that she presented to the ED.  She was admitted to the hospital for pain control.  Pharmacy was consulted to assist with lovenox bridge to coumadin.  She tolerated her stay well.  She worked well with therapy.  She will be D/C'd to SNF on 02/08/18 with a Rx for oxycodone 5mg .  Consults: Pharmacy  Significant Diagnostic Studies: N/A  Treatments: IV hydration, analgesia: acetaminophen, Dilaudid and oxycodoen, cardiac meds: amlodipine and anticoagulation: ASA, warfarin and lovenox.  Discharge Exam: Blood pressure (!) 142/63, pulse 65, temperature 98.2 F (36.8 C), temperature source Oral, resp. rate 17, height 5\' 8"  (1.727 m), weight 108.9 kg, SpO2 94 %. General: WDWN patient in NAD. Psych:  Appropriate mood and affect. Neuro:  A&O x 3, Moving all extremities, sensation intact to light touch HEENT:  EOMs intact Chest:  Even non-labored respirations Skin:  Dressing C/D/I, no rashes or lesions Extremities: warm/dry, mild edema L LE, no erythema or echymosis.  No  lymphadenopathy. Pulses: Popliteus 2+ MSK:  ROM: EHL/FHL intact, MMT: able to perform quad set, (-) Homan's   Disposition: Discharge disposition: 03-Skilled [place]  Discharge Instructions    Call MD / Call 911   Complete by:  As directed    If you experience chest pain or shortness of breath, CALL 911 and be transported to the hospital emergency room.  If you develope a fever above 101 F, pus (white drainage) or increased drainage or redness at the wound, or calf pain, call your surgeon's office.   Call MD / Call 911   Complete by:  As directed    If you experience chest pain or shortness of breath, CALL 911 and be transported to the hospital emergency room.  If you develope a fever above 101 F, pus (white drainage) or increased drainage or redness at the wound, or calf pain, call your surgeon's office.   Constipation Prevention   Complete by:  As directed    Drink plenty of fluids.  Prune juice may be helpful.  You may use a stool softener, such as Colace (over the counter) 100 mg twice a day.  Use MiraLax (over the counter) for constipation as needed.   Constipation Prevention   Complete by:  As directed    Drink plenty of fluids.  Prune juice may be helpful.  You may use a stool softener, such as Colace (over the counter) 100 mg twice a day.  Use MiraLax (over the counter) for constipation as needed.   Diet - low sodium heart healthy   Complete by:  As directed  Diet - low sodium heart healthy   Complete by:  As directed    Increase activity slowly as tolerated   Complete by:  As directed    Increase activity slowly as tolerated   Complete by:  As directed    Weight bearing as tolerated   Complete by:  As directed    Laterality:  left   Extremity:  Lower     Allergies as of 02/08/2018      Reactions   Mucinex [guaifenesin Er] Other (See Comments)   Severe headaches   Keflex [cephalexin] Other (See Comments)   Headaches and dizziness   Sulfonamide  Derivatives Other (See Comments)   Headaches      Medication List    STOP taking these medications   HYDROcodone-acetaminophen 5-325 MG tablet Commonly known as:  NORCO/VICODIN   traMADol 50 MG tablet Commonly known as:  ULTRAM     TAKE these medications   acetaminophen 500 MG tablet Commonly known as:  TYLENOL Take 1,000-1,500 mg by mouth every 6 (six) hours as needed (For knee pain.).   ALIGN 4 MG Caps Take 4 mg by mouth daily.   amLODipine 5 MG tablet Commonly known as:  NORVASC TAKE ONE TABLET BY MOUTH DAILY   aspirin EC 81 MG tablet Take 81 mg by mouth daily.   clonazePAM 0.5 MG tablet Commonly known as:  KLONOPIN Take 0.5 mg by mouth at bedtime as needed (Insomnia).   cyclobenzaprine 10 MG tablet Commonly known as:  FLEXERIL Take 10 mg by mouth 3 (three) times daily as needed for muscle spasms.   docusate sodium 100 MG capsule Commonly known as:  COLACE Take 1 capsule (100 mg total) by mouth 2 (two) times daily. While taking narcotic pain medicine.   enoxaparin 120 MG/0.8ML injection Commonly known as:  LOVENOX Inject 0.8 mLs (120 mg total) into the skin every 12 (twelve) hours.   fenofibrate 160 MG tablet Take 160 mg by mouth daily.   furosemide 40 MG tablet Commonly known as:  LASIX TAKE ONE TABLET BY MOUTH DAILY *PLEASE KEEP UPCOMING APPOINTMENT* What changed:  See the new instructions.   glucose blood test strip UAD qd for blood sugar Dx: E11.9   LAMICTAL 200 MG tablet Generic drug:  lamoTRIgine Take 100-200 mg by mouth See admin instructions. Take 100 mg by mouth in the morning and 200 mg by mouth at bedtime   metoprolol succinate 25 MG 24 hr tablet Commonly known as:  TOPROL-XL Take 1 tablet (25 mg total) by mouth every morning.   ONE TOUCH LANCETS Misc Test once daily to check blood sugar. DX E11.9   OXcarbazepine 150 MG tablet Commonly known as:  TRILEPTAL Take 150 mg by mouth 2 (two) times daily.   oxyCODONE 5 MG immediate release  tablet Commonly known as:  Oxy IR/ROXICODONE Take 1 tablet (5 mg total) by mouth every 4 (four) hours as needed for up to 5 days for moderate pain or severe pain.   pantoprazole 40 MG tablet Commonly known as:  PROTONIX Take 1 tablet (40 mg total) by mouth daily.   rOPINIRole 0.25 MG tablet Commonly known as:  REQUIP TAKE ONE TO TWO TABLETS BY MOUTH AT BEDTIME   rosuvastatin 40 MG tablet Commonly known as:  CRESTOR TAKE ONE TABLET BY MOUTH DAILY   senna 8.6 MG Tabs tablet Commonly known as:  SENOKOT Take 2 tablets (17.2 mg total) by mouth 2 (two) times daily.   SUMAtriptan 50 MG tablet Commonly known  as:  IMITREX TAKE ONE TABLET EVERY 2 HOURS AS NEEDED FOR MIGRAINE. MAY REPEAT IN 2 HOURS IF HEADACHE PERSISTS OR RECURS. (MAX OF 2 TABLETS IN 24 HOURS) What changed:  See the new instructions.   SYSTANE OP Place 1 drop into both eyes at bedtime as needed (For dry eyes.).   tiZANidine 2 MG tablet Commonly known as:  ZANAFLEX Take 1 tablet (2 mg total) by mouth every 8 (eight) hours as needed for muscle spasms.   venlafaxine XR 150 MG 24 hr capsule Commonly known as:  EFFEXOR-XR Take 150 mg by mouth every morning.   warfarin 5 MG tablet Commonly known as:  COUMADIN Take as directed. If you are unsure how to take this medication, talk to your nurse or doctor. Original instructions:  TAKE AS DIRECED BY [place] What changed:    how much to take  how to take this  when to take this            Discharge Care Instructions  (From admission, onward)         Start     Ordered   02/04/18 0000  Weight bearing as tolerated    Question Answer Comment  Laterality left   Extremity Lower      02/04/18 1740         Follow-up Information    Wylene Simmer, MD. Schedule an appointment as soon as possible for a visit in 2 week(s).   Specialty:  Orthopedic Surgery Contact information: [address] [place] [address] [phone]            Signed: Mohammed Kindle Office:  [phone]

## 2018-02-04 NOTE — Plan of Care (Signed)

## 2018-02-05 LAB — PROTIME-INR
INR: 1.43
Prothrombin Time: 17.3 seconds — ABNORMAL HIGH (ref 11.4–15.2)

## 2018-02-05 MED ORDER — WARFARIN SODIUM 5 MG PO TABS
5.0000 mg | ORAL_TABLET | Freq: Once | ORAL | Status: AC
  Administered 2018-02-05: 5 mg via ORAL
  Filled 2018-02-05: qty 1

## 2018-02-05 NOTE — Progress Notes (Signed)
Subjective:     Patient reports pain as mild to moderate.  Resting comfortably in bed.  Tolerating POs well.  Admits to flatus.  Denies fever, chills, N/V, CP, SOB.  Objective:   VITALS:  Temp:  [97.9 F (36.6 C)-98.7 F (37.1 C)] 97.9 F (36.6 C) (09/13 0541) Pulse Rate:  [58-73] 58 (09/13 0541) Resp:  [18-20] 20 (09/13 0541) BP: (125-142)/(47-64) 125/63 (09/13 0541) SpO2:  [91 %-93 %] 91 % (09/13 0541)  General: WDWN patient in NAD. Psych:  Appropriate mood and affect. Neuro:  A&O x 3, Moving all extremities, sensation intact to light touch HEENT:  EOMs intact Chest:  Even non-labored respirations Skin:  Dressing C/D/I, no rashes or lesions Extremities: warm/dry, mild edema L LE, no erythema or echymosis.  No lymphadenopathy. Pulses: Popliteus 2+ MSK:  ROM: EHL/FHl intact, MMT: able to perform quad set, (-) Homan's    LABS Recent Labs    02/04/18 0821 02/04/18 0911  HGB 12.3 11.8*  WBC 10.1 9.3  PLT 206 198   Recent Labs    02/04/18 0821  NA 138  K 3.6  CL 101  CO2 27  BUN 10  CREATININE 1.05*  GLUCOSE 172*   Recent Labs    02/04/18 0911 02/05/18 0403  INR 1.27 1.43     Assessment/Plan:     S/P L ankle hardware removal Intractable L ankle post-op pain  WBAT L LE Patient must stay 2 midnights.  D/C home in am.   Scripts on chart D/C summary pended Pharmacy assisting with lovenox to coumadin bridge (aortic mechanical valve) Plan on 2 week outpatient post-op visit with Dr. Annia Friendly [facility] Office:  [phone]

## 2018-02-05 NOTE — Progress Notes (Signed)
Patient very unsteady and having difficulty with pivoting during transfer to bedside commode. May benefit from PT consult. Per patient husband sick at home and can't help as much as normal.

## 2018-02-05 NOTE — Progress Notes (Signed)
   02/05/18 1100  Clinical Encounter Type  Visited With Patient  Visit Type Initial  Referral From Patient (entered by RN per pt request)  Spiritual Encounters  Spiritual Needs Emotional  Stress Factors  Patient Stress Factors Major life changes;Other (Comment) (her husband is also in [facility], 6 wks post-surgery, in-pt rehab)  Family Stress Factors Other (Comment);Major life changes;Loss of control (both she and her husband are hospitalized)   Responded to chaplain consult request, pt did not recall request.  Visited w/ pt, learned about her current hospitalization, that of her husband, concerns about care of her husband post medical rehab--she noted that a hospital employee was helping them look at possible SNF options.  Validate her feelings of disappointment and ambivalence, provided compassionate presence and listening to the challenges she is facing.  [name] resident, [phone]

## 2018-02-05 NOTE — Progress Notes (Signed)
ANTICOAGULATION CONSULT NOTE - Follow Up Consult  Pharmacy Consult for Lovenox and Warfarin Indication: atrial fibrillation and St Jude AVR  Allergies  Allergen Reactions  . Mucinex [Guaifenesin Er] Other (See Comments)    Severe headaches   . Keflex [Cephalexin] Other (See Comments)    Headaches and dizziness  . Sulfonamide Derivatives Other (See Comments)    Headaches     Patient Measurements: Height: 5\' 8"  (172.7 cm) Weight: 240 lb (108.9 kg) IBW/kg (Calculated) : 63.9  Vital Signs: Temp: 97.9 F (36.6 C) (09/13 0541) Temp Source: Oral (09/13 0541) BP: 125/63 (09/13 0541) Pulse Rate: 58 (09/13 0541)  Labs: Recent Labs    02/04/18 0821 02/04/18 0911 02/05/18 0403  HGB 12.3 11.8*  --   HCT 37.9 36.6  --   PLT 206 198  --   LABPROT  --  15.8* 17.3*  INR  --  1.27 1.43  CREATININE 1.05*  --   --     Estimated Creatinine Clearance: 67.2 mL/min (A) (by C-G formula based on SCr of 1.05 mg/dL (H)).  Assessment: 67 year old female on chronic warfarin prior to admission for a mechanical St. Jude AVR and atrial fibrillation who was admitted for L-tibial hardware removal on 9/11. Warfarin was held for 5 days pre-operatively and patient was initiated on Lovenox bridge therapy.   Lovenox and Warfarin were resumed post-operatively on 9/12. CBC has been stable.  INR today is up to 1.43 but still sub-therapeutic for goal.  No bleeding noted.  Plan to discharge patient on 9/14.  Home regimen prior to admission: Warfarin 2.5mg  daily except 5mg  on Tuesdays and Saturdays.   Goal of Therapy:  INR 2.5-3.5 Monitor platelets by anticoagulation protocol: Yes   Plan:  Continue Lovenox 110mg  SQ every 12 hours until INR therapeutic x48 hours.  Give Warfarin 5mg  po x1 tonight at 1800 (higher dose than home regimen due to amount of time off Coumadin and low INR for goal). Pending response in INR - may be able to resume home dose soon.  Monitor daily INR, CBC, and clinical status.    Sloan Leiter, PharmD, BCPS, BCCCP Clinical Pharmacist Clinical phone 02/05/2018 until 3:30PM [phone] Please refer to [facility] for [place] numbers 02/05/2018,1:24 PM

## 2018-02-05 NOTE — Care Management Note (Signed)
Case Management Note  Patient Details  Name: [name] MRN: [id number] [name]: [birth year]  Subjective/Objective:   S/p L ankle hardware removal               Action/Plan: NCM spoke to pt and states she will follow up with surgeon in two weeks and will discuss OPPT. She had RW and bedside commode at home. Family will assist with her care.   Expected Discharge [name]:  02/05/18               Expected Discharge Plan:  Home/Self Care  In-House Referral:  NA  Discharge planning Services  CM Consult  Post Acute Care Choice:  NA Choice offered to:  NA  DME Arranged:  N/A DME Agency:  NA  HH Arranged:  NA HH Agency:  NA  Status of Service:  Completed, signed off  If discussed at [place] of Stay Meetings, dates discussed:    Additional Comments:  Erenest Rasher, RN 02/05/2018, 9:49 AM

## 2018-02-06 ENCOUNTER — Encounter (HOSPITAL_COMMUNITY): Payer: Self-pay | Admitting: *Deleted

## 2018-02-06 LAB — PROTIME-INR
INR: 1.73
Prothrombin Time: 20.1 seconds — ABNORMAL HIGH (ref 11.4–15.2)

## 2018-02-06 MED ORDER — WARFARIN SODIUM 5 MG PO TABS
5.0000 mg | ORAL_TABLET | Freq: Once | ORAL | Status: AC
  Administered 2018-02-06: 5 mg via ORAL
  Filled 2018-02-06: qty 1

## 2018-02-06 NOTE — Plan of Care (Signed)

## 2018-02-06 NOTE — Progress Notes (Signed)
ANTICOAGULATION CONSULT NOTE - Follow Up Consult  Pharmacy Consult for Lovenox and Warfarin Indication: atrial fibrillation and St Jude AVR  Allergies  Allergen Reactions  . Mucinex [Guaifenesin Er] Other (See Comments)    Severe headaches   . Keflex [Cephalexin] Other (See Comments)    Headaches and dizziness  . Sulfonamide Derivatives Other (See Comments)    Headaches     Patient Measurements: Height: 5\' 8"  (172.7 cm) Weight: 240 lb (108.9 kg) IBW/kg (Calculated) : 63.9  Vital Signs: Temp: 98 F (36.7 C) (09/14 0453) Temp Source: Oral (09/14 0453) BP: 106/51 (09/14 0453) Pulse Rate: 62 (09/14 0453)  Labs: Recent Labs    02/04/18 0821 02/04/18 0911 02/05/18 0403 02/06/18 0402  HGB 12.3 11.8*  --   --   HCT 37.9 36.6  --   --   PLT 206 198  --   --   LABPROT  --  15.8* 17.3* 20.1*  INR  --  1.27 1.43 1.73  CREATININE 1.05*  --   --   --     Estimated Creatinine Clearance: 67.2 mL/min (A) (by C-G formula based on SCr of 1.05 mg/dL (H)).  Assessment: 67 year old female on chronic warfarin prior to admission for a mechanical St. Jude AVR and atrial fibrillation who was admitted for L-tibial hardware removal on 9/11. Warfarin was held for 5 days pre-operatively and patient was initiated on Lovenox bridge therapy.   Lovenox and Warfarin were resumed post-operatively on 9/12. CBC has been stable. Nursing confirms no signs/symptoms of bleeding.   INR subtherapeutic at 1.73 but increased from 1.43 on 9/13.  Plan to discharge patient on 9/14.  Home regimen prior to admission: Warfarin 2.5mg  daily except 5mg  on Tuesdays and Saturdays.   Goal of Therapy:  INR 2.5-3.5 Monitor platelets by anticoagulation protocol: Yes   Plan:  Continue Lovenox 110mg  SQ every 12 hours until INR therapeutic x48 hours.  Resume warfarin home dose, give 5mg  po tonight at 1800  Monitor daily INR, CBC, and clinical status.   If discharging, resume home dose warfarin (2.5mg  every day  except 5mg  on Tuesdays and Saturdays) and continue Lovenox bridge (Lovenox 110mg  SQ every 12 hours). Follow up INR out patient.   Azzie Roup D PGY1 Pharmacy Resident  Phone [phone] Please use [facility] for clinical pharmacists numbers  02/06/2018      9:20 AM

## 2018-02-06 NOTE — Progress Notes (Signed)
[name]  MRN: [id number] DOB/Age: 1951/01/22 67 y.o. Physician: [name], Cynthia.D.     Subjective: Reports mild to moderate left ankle pain, has not yet transferred or ambulated. Vital Signs Temp:  [98 F (36.7 C)-98.7 F (37.1 C)] 98 F (36.7 C) (09/14 0453) Pulse Rate:  [62-67] 62 (09/14 0453) Resp:  [17] 17 (09/13 1456) BP: (106-147)/(51-76) 106/51 (09/14 0453) SpO2:  [91 %-93 %] 92 % (09/14 0453)  Lab Results Recent Labs    02/04/18 0821 02/04/18 0911  WBC 10.1 9.3  HGB 12.3 11.8*  HCT 37.9 36.6  PLT 206 198   BMET Recent Labs    02/04/18 0821  NA 138  K 3.6  CL 101  CO2 27  GLUCOSE 172*  BUN 10  CREATININE 1.05*  CALCIUM 8.9   INR  Date Value Ref Range Status  02/06/2018 1.73  Final    Comment:    Performed at [facility], [place] [address], [place], [place] [address]     Exam  Dressings about left ankle clean and dry. Fair digital and ankle motion.  Plan PT consult for transfers and gait training, WBAT LLE D/c home when cleared by PT [facility] [name] 02/06/2018, 9:33 AM    Contact # [phone]

## 2018-02-06 NOTE — Evaluation (Signed)
Physical Therapy Evaluation Patient Details Name: [name] MRN: [id number] DOB: Feb 01, 1951 Today's Date: 02/06/2018   History of Present Illness  Pt is a 67 y.o. F with significant PMH of St. Jude AVR and atrial fibrillation who was admitted for left tibial hardware removal on 9/11  Clinical Impression  Prior to admission, patient states she was independent with mobility and ADL's. Unfortunately, patient's husband is currently in [place]. After phone discussion with pt daughter, she made the decision that she cannot accommodate having pt move in with her at home temporarily and would like to pursue short term rehab for patient. Currently, patient presenting with decreased functional mobility secondary to pain, gait abnormalities, LLE weakness, decreased endurance and impaired balance. Cannot transfer independently and requires min guard for safety with ambulating using walker. Patient presents as high risk for falls and needs increased supervision/assistance. Due to decreased caregiver support, recommending SNF at discharge to maximize functional independence. Will follow acutely to progress mobility.     Follow Up Recommendations SNF;Supervision for mobility/OOB    Equipment Recommendations  3in1 (PT) -    Recommendations for Other Services       Precautions / Restrictions Precautions Precautions: Fall Restrictions Weight Bearing Restrictions: Yes LLE Weight Bearing: Weight bearing as tolerated      Mobility  Bed Mobility Overal bed mobility: Needs Assistance Bed Mobility: Supine to Sit     Supine to sit: Supervision        Transfers Overall transfer level: Needs assistance Equipment used: Rolling walker (2 wheeled) Transfers: Sit to/from Stand Sit to Stand: Min assist         General transfer comment: min assist to power up, especially from lower surface  Ambulation/Gait Ambulation/Gait assistance: Min guard Gait Distance (Feet): 75 Feet Assistive device: Rolling  walker (2 wheeled) Gait Pattern/deviations: Step-to pattern;Decreased weight shift to left;Trunk flexed;Antalgic Gait velocity: decreased Gait velocity interpretation: <1.31 ft/sec, indicative of household ambulator General Gait Details: Patient with antalgic gait pattern and moderate reliance through BUE's on walker. very slow speed and frequent cueing needed for safety in regards to walker proximity  Stairs            Wheelchair Mobility    Modified Rankin (Stroke Patients Only)       Balance Overall balance assessment: Needs assistance Sitting-balance support: No upper extremity supported;Feet supported Sitting balance-Leahy Scale: Good     Standing balance support: Bilateral upper extremity supported Standing balance-Leahy Scale: Poor                               Pertinent Vitals/Pain Pain Assessment: Faces Faces Pain Scale: Hurts even more Pain Location: LLE Pain Descriptors / Indicators: Discomfort;Grimacing;Guarding Pain Intervention(s): Monitored during session    Home Living Family/patient expects to be discharged to:: Private residence Living Arrangements: Alone Available Help at Discharge: Family;Available PRN/intermittently Type of Home: House Home Access: Level entry     Home Layout: One level Home Equipment: Walker - 2 wheels;Cane - single point Additional Comments: Husband is currently in [facility]    Prior Function Level of Independence: Independent               Hand Dominance        Extremity/Trunk Assessment   Upper Extremity Assessment Upper Extremity Assessment: Overall WFL for tasks assessed    Lower Extremity Assessment Lower Extremity Assessment: LLE deficits/detail LLE Deficits / Details: at least anti gravity, not formally assessed due to  pain       Communication   Communication: No difficulties  Cognition Arousal/Alertness: Awake/alert Behavior During Therapy: WFL for tasks assessed/performed Overall  Cognitive Status: Within Functional Limits for tasks assessed                                        General Comments      Exercises General Exercises - Lower Extremity Long Arc Quad: 10 reps;Left;Seated   Assessment/Plan    PT Assessment Patient needs continued PT services  PT Problem List Decreased strength;Decreased activity tolerance;Decreased balance;Decreased mobility;Pain       PT Treatment Interventions DME instruction;Gait training;Stair training;Functional mobility training;Therapeutic activities;Therapeutic exercise;Balance training;Patient/family education    PT Goals (Current goals can be found in the Care Plan section)  Acute Rehab PT Goals Patient Stated Goal: take care of herself PT Goal Formulation: With patient Time For Goal Achievement: 02/20/18 Potential to Achieve Goals: Good    Frequency Min 5X/week   Barriers to discharge Decreased caregiver support      Co-evaluation               AM-PAC PT "6 Clicks" Daily Activity  Outcome Measure Difficulty turning over in bed (including adjusting bedclothes, sheets and blankets)?: A Little Difficulty moving from lying on back to sitting on the side of the bed? : A Lot Difficulty sitting down on and standing up from a chair with arms (e.g., wheelchair, bedside commode, etc,.)?: Unable Help needed moving to and from a bed to chair (including a wheelchair)?: A Little Help needed walking in hospital room?: A Little Help needed climbing 3-5 steps with a railing? : A Lot 6 Click Score: 14    End of Session Equipment Utilized During Treatment: Gait belt Activity Tolerance: Patient tolerated treatment well Patient left: in bed;with call bell/phone within reach Nurse Communication: Mobility status PT Visit Diagnosis: Other abnormalities of gait and mobility (R26.89);Difficulty in walking, not elsewhere classified (R26.2);Pain Pain - Right/Left: Left Pain - part of body: Leg    Time:  1310-1345 PT Time Calculation (min) (ACUTE ONLY): 35 min   Charges:   PT Evaluation $PT Eval Moderate Complexity: 1 Mod PT Treatments $Gait Training: 8-22 mins       Ellamae Sia, PT, DPT Acute Rehabilitation Services Pager [phone] Office [phone]   Willy Eddy 02/06/2018, 2:12 PM

## 2018-02-07 ENCOUNTER — Encounter (HOSPITAL_COMMUNITY): Payer: Self-pay

## 2018-02-07 ENCOUNTER — Other Ambulatory Visit: Payer: Self-pay | Admitting: Family Medicine

## 2018-02-07 LAB — CBC
HCT: 33.5 % — ABNORMAL LOW (ref 36.0–46.0)
Hemoglobin: 10.7 g/dL — ABNORMAL LOW (ref 12.0–15.0)
MCH: 29.5 pg (ref 26.0–34.0)
MCHC: 31.9 g/dL (ref 30.0–36.0)
MCV: 92.3 fL (ref 78.0–100.0)
Platelets: 195 10*3/uL (ref 150–400)
RBC: 3.63 MIL/uL — ABNORMAL LOW (ref 3.87–5.11)
RDW: 13.8 % (ref 11.5–15.5)
WBC: 6.9 10*3/uL (ref 4.0–10.5)

## 2018-02-07 LAB — PROTIME-INR
INR: 1.97
Prothrombin Time: 22.3 seconds — ABNORMAL HIGH (ref 11.4–15.2)

## 2018-02-07 MED ORDER — WARFARIN SODIUM 2.5 MG PO TABS
2.5000 mg | ORAL_TABLET | Freq: Once | ORAL | Status: AC
  Administered 2018-02-07: 2.5 mg via ORAL
  Filled 2018-02-07: qty 1

## 2018-02-07 NOTE — Clinical Social Work Note (Signed)
Clinical Social Work Assessment  Patient Details  Name: [name] MRN: [id number] Date of Birth: [birth year]  Date of referral:  02/06/18               Reason for consult:  Facility Placement                Permission sought to share information with:  Family Supports Permission granted to share information::     Name::     Amy  Agency::  [facility]  Relationship::  daughter  Contact Information:     Housing/Transportation Living arrangements for the past 2 months:  Single Family Home Source of Information:  Adult Children Patient Interpreter Needed:  None Criminal Activity/Legal Involvement Pertinent to Current Situation/Hospitalization:  No - Comment as needed Significant Relationships:  Adult Children, Spouse Lives with:  Spouse Do you feel safe going back to the place where you live?  No Need for family participation in patient care:  No (Coment)  Care giving concerns:  Pt is alert and oriented. [name] is determining plan for pt. CSW spoke with pt's daughter via telephone.   Social Worker assessment / plan:  CSW spoke with pt's daughter via telephone. [name] states she would like for her mom to go to [facility]. Pt lives with her spouse however, he is currently in [place]. [name] states pt will need to go to SNF for STR before d/c home. CSW will follow up with facility and initiate insurance authorization.  Employment status:  Retired Nurse, adult PT Recommendations:  [place] / Referral to community resources:  [place]  Patient/Family's Response to care:  [name] verbalized understanding of CSW role and expressed appreciation for support. [name] denies any concern regarding pt care at this time.   Patient/Family's Understanding of and Emotional Response to Diagnosis, Current Treatment, and Prognosis:  [name] understanding and realistic regarding pt's physical limitations.  [name] understands the need for pt to go to SNF at d/c. [name] denies any concern regarding pt's treatment plan at this time. CSW will continue to provide support and facilitate d/c needs.   Emotional Assessment Appearance:  Appears stated age Attitude/Demeanor/Rapport:  ([name] was appropriate during conversation) Affect (typically observed):  Accepting, Appropriate Orientation:  Oriented to Self, Oriented to Place, Oriented to  Time, Oriented to Situation Alcohol / Substance use:  Not Applicable Psych involvement (Current and /or in the community):  No (Comment)  Discharge Needs  Concerns to be addressed:  Basic Needs, Care Coordination Readmission within the last 30 days:  No Current discharge risk:  Dependent with Mobility Barriers to Discharge:  Continued Medical Work up   [employer], LCSW 02/07/2018, 10:29 AM

## 2018-02-07 NOTE — Progress Notes (Signed)
Subjective:    Patient reports pain as moderate to left ankle.  Tolerating PO's. Limited PT. Denies CP or SOB. No numbness or tingling.  Objective: Vital signs in last 24 hours: Temp:  [97.9 F (36.6 C)-98.9 F (37.2 C)] 98.1 F (36.7 C) (09/15 0433) Pulse Rate:  [63-79] 79 (09/15 0433) Resp:  [16-18] 16 (09/14 1706) BP: (130-159)/(51-62) 130/51 (09/15 0433) SpO2:  [94 %-98 %] 97 % (09/15 0433)  Intake/Output from previous day: 09/14 0701 - 09/15 0700 In: 600 [P.O.:600] Out: 700 [Urine:700] Intake/Output this shift: Total I/O In: 120 [P.O.:120] Out: -   Recent Labs    02/04/18 0821 02/04/18 0911 02/07/18 0431  HGB 12.3 11.8* 10.7*   Recent Labs    02/04/18 0911 02/07/18 0431  WBC 9.3 6.9  RBC 4.00 3.63*  HCT 36.6 33.5*  PLT 198 195   Recent Labs    02/04/18 0821  NA 138  K 3.6  CL 101  CO2 27  BUN 10  CREATININE 1.05*  GLUCOSE 172*  CALCIUM 8.9   Recent Labs    02/06/18 0402 02/07/18 0431  INR 1.73 1.97    Alert and oriented x3. RRR, Lungs clear, BS x4. Left Calf soft and non tender. L ankle dressing C/D/I. No DVT signs. No signs of infection or compartment syndrome. LLE grossly neurovascularly intact.   Anticipated LOS equal to or greater than 2 midnights due to - Age 67 and older with one or more of the following:  - Obesity  - Expected need for hospital services (PT, OT, Nursing) required for safe  discharge  - Anticipated need for postoperative skilled nursing care or inpatient rehab  - Active co-morbidities: None OR   - Unanticipated findings during/Post Surgery: Slow post-op progression: GI, pain control, mobility  - Patient is a high risk of re-admission due to: None   Assessment/Plan:    Left lower leg pain:  S/P hardware removal:  Advance diet Up with therapy Discharge to SNF Tomorrow  Pain management     Yazmen Briones L 02/07/2018, 8:09 AM

## 2018-02-07 NOTE — Plan of Care (Signed)
  Problem: Education: Goal: Knowledge of General Education information will improve Description Including pain rating scale, medication(s)/side effects and non-pharmacologic comfort measures Outcome: Progressing   Problem: Health Behavior/Discharge Planning: Goal: Ability to manage health-related needs will improve Outcome: Progressing   Problem: Clinical Measurements: Goal: Ability to maintain clinical measurements within normal limits will improve Outcome: Progressing Goal: Will remain free from infection Outcome: Progressing Goal: Diagnostic test results will improve Outcome: Progressing Goal: Cardiovascular complication will be avoided Outcome: Progressing   Problem: Activity: Goal: Risk for activity intolerance will decrease Outcome: Progressing   Problem: Nutrition: Goal: Adequate nutrition will be maintained Outcome: Progressing   Problem: Coping: Goal: Level of anxiety will decrease Outcome: Progressing   Problem: Elimination: Goal: Will not experience complications related to bowel motility Outcome: Progressing   Problem: Safety: Goal: Ability to remain free from injury will improve Outcome: Progressing   Problem: Skin Integrity: Goal: Risk for impaired skin integrity will decrease Outcome: Progressing   Problem: Spiritual Needs Goal: Ability to function at adequate level Outcome: Progressing

## 2018-02-07 NOTE — NC FL2 (Signed)
[place] LEVEL OF CARE SCREENING TOOL     IDENTIFICATION  Patient Name: [name] Birthdate: [birth year] Sex: female Admission Date (Current Location): 02/03/2018  [facility] and [place] Number:  Herbalist and Address:  [facility] [place], [place] [address] [place], [place] [address]      Provider Number: [id number]  Attending Physician Name and Address:  Wylene Simmer, MD  Relative Name and Phone Number:  Pearla Dubonnet, daughter, [phone]    Current Level of Care: Hospital Recommended Level of Care: [place] Prior Approval Number:    Date Approved/Denied:   PASRR Number:    Discharge Plan: SNF    Current Diagnoses: Patient Active Problem List   Diagnosis Date Noted  . Ankle pain 02/04/2018  . Nocturia 11/02/2017  . Low back pain 11/02/2017  . Diabetes ([place]) 11/02/2017  . Hyperlipidemia associated with type 2 diabetes mellitus ([place]) 06/05/2016  . Migraine 06/05/2016  . Encounter for therapeutic drug monitoring 09/13/2013  . Essential hypertension, benign 04/05/2013  . Atrial fibrillation ([place]) 03/18/2013  . Long term current use of anticoagulant therapy 03/11/2013  . Urinary tract infection 02/10/2013  . Pernicious anemia 02/21/2011  . Renal insufficiency 12/30/2010  . Hyperthyroidism 12/18/2010  . MIXED HYPERLIPIDEMIA 03/11/2010  . Bipolar disorder ([place]) 03/11/2010  . DIVERTICULOSIS OF COLON 03/11/2010  . Constipation 03/11/2010  . Hx of mechanical aortic valve replacement 03/11/2010    Orientation RESPIRATION BLADDER Height & Weight     Self, Time, Situation, Place  Normal Continent Weight: 240 lb (108.9 kg) Height:  5\' 8"  (172.7 cm)  BEHAVIORAL SYMPTOMS/MOOD NEUROLOGICAL BOWEL NUTRITION STATUS      Continent Diet(Heart healthy/carb modified, thin liquids)  AMBULATORY STATUS COMMUNICATION OF NEEDS Skin   Limited Assist Verbally Surgical wounds(Closed incision, right ankle, compression wrapped)                        Personal Care Assistance Level of Assistance  Bathing, Feeding, Dressing Bathing Assistance: Limited assistance Feeding assistance: Independent Dressing Assistance: Limited assistance     Functional Limitations Info  Sight, Hearing, Speech Sight Info: Adequate Hearing Info: Adequate Speech Info: Adequate    SPECIAL CARE FACTORS FREQUENCY  PT (By licensed PT), OT (By licensed OT)     PT Frequency: 5x OT Frequency: 5x            Contractures Contractures Info: Not present    Additional Factors Info  Code Status, Allergies Code Status Info: Full Code Allergies Info: Mucinex Guaifenesin Er, Keflex Cephalexin, Sulfonamide Derivatives           Current Medications (02/07/2018):  This is the current hospital active medication list Current Facility-Administered Medications  Medication Dose Route Frequency Provider Last Rate Last Dose  . acetaminophen (TYLENOL) tablet 650 mg  650 mg Oral Q6H PRN Stilwell, Bryson L, PA-C   650 mg at 02/07/18 0651  . acidophilus (RISAQUAD) capsule 1 capsule  1 capsule Oral Daily Wylene Simmer, MD   1 capsule at 02/07/18 0902  . amLODipine (NORVASC) tablet 5 mg  5 mg Oral Daily Stilwell, Bryson L, PA-C   5 mg at 02/06/18 1755  . aspirin EC tablet 81 mg  81 mg Oral Daily Stilwell, Bryson L, PA-C   81 mg at 02/07/18 0903  . busPIRone (BUSPAR) tablet 15 mg  15 mg Oral BID Stilwell, Bryson L, PA-C   15 mg at 02/07/18 0902  . clonazePAM (KLONOPIN) tablet 0.5  mg  0.5 mg Oral BID Stilwell, Bryson L, PA-C   0.5 mg at 02/07/18 0904  . [facility]) tablet 1,875 mg  1,875 mg Oral BID WC Stilwell, Bryson L, PA-C   1,875 mg at 02/07/18 0909  . enoxaparin (LOVENOX) injection 110 mg  110 mg Subcutaneous Q12H Franky Macho, RPH   110 mg at 02/07/18 0911  . febuxostat (ULORIC) tablet 40 mg  40 mg Oral Daily Stilwell, Bryson L, PA-C   40 mg at 02/07/18 0910  . fenofibrate tablet 160 mg  160 mg Oral Daily Stilwell, Bryson L, PA-C    160 mg at 02/07/18 0903  . furosemide (LASIX) tablet 40 mg  40 mg Oral Daily Stilwell, Bryson L, PA-C   40 mg at 02/07/18 0905  . HYDROmorphone (DILAUDID) injection 0.5 mg  0.5 mg Intravenous Q4H PRN Stilwell, Bryson L, PA-C   0.5 mg at 02/06/18 0025  . lamoTRIgine (LAMICTAL) tablet 300 mg  300 mg Oral Daily Stilwell, Bryson L, PA-C   300 mg at 02/07/18 0907  . metoprolol succinate (TOPROL-XL) 24 hr tablet 25 mg  25 mg Oral q morning - 10a Stilwell, Bryson L, PA-C   25 mg at 02/07/18 0904  . oxyCODONE (Oxy IR/ROXICODONE) immediate release tablet 5 mg  5 mg Oral Q4H PRN Stilwell, Bryson L, PA-C   5 mg at 02/07/18 0651  . pantoprazole (PROTONIX) EC tablet 40 mg  40 mg Oral Daily Stilwell, Bryson L, PA-C   40 mg at 02/07/18 0904  . polyvinyl alcohol (LIQUIFILM TEARS) 1.4 % ophthalmic solution   Both Eyes QHS PRN Stilwell, Bryson L, PA-C      . QUEtiapine (SEROQUEL XR) 24 hr tablet 400 mg  400 mg Oral QHS Stilwell, Bryson L, PA-C   400 mg at 02/06/18 2111  . rOPINIRole (REQUIP) tablet 0.25-0.5 mg  0.25-0.5 mg Oral QHS Stilwell, Bryson L, PA-C   0.5 mg at 02/06/18 2111  . rosuvastatin (CRESTOR) tablet 40 mg  40 mg Oral q1800 Stilwell, Bryson L, PA-C   40 mg at 02/06/18 1750  . tiZANidine (ZANAFLEX) tablet 2 mg  2 mg Oral Q8H PRN Stilwell, Bryson L, PA-C   2 mg at 02/06/18 0341  . venlafaxine XR (EFFEXOR-XR) 24 hr capsule 150 mg  150 mg Oral q morning - 10a Stilwell, Bryson L, PA-C   150 mg at 02/07/18 0904  . warfarin (COUMADIN) tablet 2.5 mg  2.5 mg Oral ONCE-1800 Servando Salina, RPH      . Warfarin - Pharmacist Dosing Inpatient   Does not apply [id number] Wylene Simmer, MD         Discharge Medications: Please see discharge summary for a list of discharge medications.  Relevant Imaging Results:  Relevant Lab Results:   Additional Information SSN: [id number]  Eileen Stanford, LCSW

## 2018-02-07 NOTE — Evaluation (Signed)
Occupational Therapy Evaluation Patient Details Name: [name] MRN: [id number] DOB: 20-Feb-1951 Today's Date: 02/07/2018    History of Present Illness Pt is a 67 y.o. F with significant PMH of St. Jude AVR and atrial fibrillation who was admitted for left tibial hardware removal on 9/11   Clinical Impression   PTA, pt was living with her husband (who recently has been at [facility] for rehab) and was independent. Pt currently requiring Max A for LB ADLs and Min A for functional mobility with RW. Pt presenting with decreased strength, balance, and awareness. Pt would benefit from further acute OT to facilitate safe dc. Recommend dc to SNF for further OT to optimize safety, independence with ADLs, and return to PLOF.      Follow Up Recommendations  SNF;Supervision/Assistance - 24 hour    Equipment Recommendations  Other (comment)(Defer to next venue)    Recommendations for Other Services PT consult     Precautions / Restrictions Precautions Precautions: Fall Restrictions Weight Bearing Restrictions: No LLE Weight Bearing: Weight bearing as tolerated      Mobility Bed Mobility Overal bed mobility: Needs Assistance Bed Mobility: Supine to Sit     Supine to sit: Supervision     General bed mobility comments: supervision with increased time and cues  Transfers Overall transfer level: Needs assistance Equipment used: Rolling walker (2 wheeled) Transfers: Sit to/from Stand Sit to Stand: Min assist         General transfer comment: min assist to power up, especially from lower surface    Balance Overall balance assessment: Needs assistance Sitting-balance support: No upper extremity supported;Feet supported Sitting balance-Leahy Scale: Good     Standing balance support: Bilateral upper extremity supported Standing balance-Leahy Scale: Poor                             ADL either performed or assessed with clinical judgement   ADL Overall ADL's : Needs  assistance/impaired Eating/Feeding: Set up;Supervision/ safety;Sitting   Grooming: Minimal assistance;Standing;Wash/dry hands Grooming Details (indicate cue type and reason): Min A for standing balance amd safety. Pt attempting to lean over sink while standing too far away from it. Requiring increased cues to walk towards sink to wash hands safely Upper Body Bathing: Minimal assistance;Sitting   Lower Body Bathing: Maximal assistance;Sit to/from stand   Upper Body Dressing : Minimal assistance;Sitting   Lower Body Dressing: Maximal assistance;Sit to/from stand   Toilet Transfer: Minimal assistance;Ambulation;RW;Regular Toilet;Grab bars Toilet Transfer Details (indicate cue type and reason): Min A to power up into standing Toileting- Clothing Manipulation and Hygiene: Minimal assistance;Sit to/from stand Toileting - Clothing Manipulation Details (indicate cue type and reason): Min A for balance and managing clothing     Functional mobility during ADLs: Minimal assistance;Rolling walker General ADL Comments: Pt with decrease problem solving and requirign increased cues     Vision         Perception     Praxis      Pertinent Vitals/Pain Pain Assessment: Faces Faces Pain Scale: Hurts even more Pain Location: LLE Pain Descriptors / Indicators: Discomfort;Grimacing;Guarding Pain Intervention(s): Monitored during session;Limited activity within patient's tolerance;Repositioned     Hand Dominance Right   Extremity/Trunk Assessment Upper Extremity Assessment Upper Extremity Assessment: Overall WFL for tasks assessed   Lower Extremity Assessment Lower Extremity Assessment: LLE deficits/detail LLE Deficits / Details: at least anti gravity, not formally assessed due to pain   Cervical / Trunk Assessment Cervical / Trunk  Assessment: Normal   Communication Communication Communication: No difficulties   Cognition Arousal/Alertness: Awake/alert;Suspect due to  medications Behavior During Therapy: [facility] for tasks assessed/performed Overall Cognitive Status: Impaired/Different from baseline Area of Impairment: Following commands;Problem solving;Awareness                       Following Commands: Follows one step commands with increased time   Awareness: Emergent Problem Solving: Slow processing;Requires verbal cues General Comments: Pt with decreased awareness and problem solving. requiring increased time and cues. Feel decreased cognition due to medication. Daughter reports this is different from pt baseline   General Comments  Daughter present throughout session    Exercises     Shoulder Instructions      Home Living Family/patient expects to be discharged to:: Private residence Living Arrangements: Alone Available Help at Discharge: Family;Available PRN/intermittently Type of Home: House Home Access: Level entry     Home Layout: One level     Bathroom Shower/Tub: Teacher, early years/pre: Standard     Home Equipment: Environmental consultant - 2 wheels;Cane - single point   Additional Comments: Husband is at [employer] Daughter's house at 15 stairs for bed/bath      Prior Functioning/Environment Level of Independence: Independent                 OT Problem List: Decreased strength;Decreased range of motion;Decreased activity tolerance;Impaired balance (sitting and/or standing);Decreased safety awareness;Decreased knowledge of use of DME or AE;Decreased cognition;Decreased knowledge of precautions;Pain      OT Treatment/Interventions: Self-care/ADL training;Therapeutic exercise;Energy conservation;DME and/or AE instruction;Therapeutic activities;Patient/family education    OT Goals(Current goals can be found in the care plan section) Acute Rehab OT Goals Patient Stated Goal: "Go home" OT Goal Formulation: With patient/family Time For Goal Achievement: 02/21/18 Potential to Achieve Goals: Good ADL Goals Pt Will Perform  Grooming: with modified independence;standing Pt Will Perform Upper Body Dressing: with modified independence;sitting Pt Will Perform Lower Body Dressing: sit to/from stand;with min guard assist Pt Will Transfer to Toilet: with modified independence;bedside commode;ambulating Pt Will Perform Toileting - Clothing Manipulation and hygiene: with modified independence;sit to/from stand;sitting/lateral leans  OT Frequency: Min 2X/week   Barriers to D/C: Decreased caregiver support  Daughter's home not accessable and unable to provide 24/7 support       Co-evaluation              AM-PAC PT "6 Clicks" Daily Activity     Outcome Measure Help from another person eating meals?: None Help from another person taking care of personal grooming?: A Little Help from another person toileting, which includes using toliet, bedpan, or urinal?: A Little Help from another person bathing (including washing, rinsing, drying)?: A Lot Help from another person to put on and taking off regular upper body clothing?: A Little Help from another person to put on and taking off regular lower body clothing?: A Lot 6 Click Score: 17   End of Session Equipment Utilized During Treatment: Gait belt;Rolling walker Nurse Communication: Mobility status;Weight bearing status  Activity Tolerance: Patient tolerated treatment well Patient left: in chair;with call bell/phone within reach;with chair alarm set;with family/visitor present  OT Visit Diagnosis: Unsteadiness on feet (R26.81);Other abnormalities of gait and mobility (R26.89);Muscle weakness (generalized) (M62.81);Pain Pain - Right/Left: Left Pain - part of body: Ankle and joints of foot                Time: 0931-1000 OT Time Calculation (min): 29 min Charges:  OT General  Charges $OT Visit: 1 Visit OT Evaluation $OT Eval Moderate Complexity: 1 Mod OT Treatments $Self Care/Home Management : 8-22 mins  Arkeem Harts MSOT, OTR/L Acute Rehab Pager:  [phone] Office: [place] 02/07/2018, 4:19 PM

## 2018-02-07 NOTE — Progress Notes (Signed)
ANTICOAGULATION CONSULT NOTE - Follow Up Consult  Pharmacy Consult for Lovenox and Warfarin Indication: atrial fibrillation and St Jude AVR  Allergies  Allergen Reactions  . Mucinex [Guaifenesin Er] Other (See Comments)    Severe headaches   . Keflex [Cephalexin] Other (See Comments)    Headaches and dizziness  . Sulfonamide Derivatives Other (See Comments)    Headaches     Patient Measurements: Height: 5\' 8"  (172.7 cm) Weight: 240 lb (108.9 kg) IBW/kg (Calculated) : 63.9  Vital Signs: Temp: 98.1 F (36.7 C) (09/15 0433) Temp Source: Oral (09/15 0433) BP: 130/51 (09/15 0433) Pulse Rate: 79 (09/15 0433)  Labs: Recent Labs    02/04/18 0911 02/05/18 0403 02/06/18 0402 02/07/18 0431  HGB 11.8*  --   --  10.7*  HCT 36.6  --   --  33.5*  PLT 198  --   --  195  LABPROT 15.8* 17.3* 20.1* 22.3*  INR 1.27 1.43 1.73 1.97    Estimated Creatinine Clearance: 67.2 mL/min (A) (by C-G formula based on SCr of 1.05 mg/dL (H)).  Assessment: 67 year old female on chronic warfarin prior to admission for a mechanical St. Jude AVR and atrial fibrillation who was admitted for L-tibial hardware removal on 9/11. Warfarin was held for 5 days pre-operatively and patient was initiated on Lovenox bridge therapy.   Lovenox and Warfarin were resumed post-operatively on 9/12. CBC low stable. Nursing confirms no signs/symptoms of bleeding.   INR subtherapeutic at 1.97 but increased from 1.73 on 9/14. Nursing   Home regimen prior to admission: Warfarin 2.5mg  daily except 5mg  on Tuesdays and Saturdays.   Goal of Therapy:  INR 2.5-3.5 Monitor platelets by anticoagulation protocol: Yes   Plan:  Continue Lovenox 110mg  SQ every 12 hours until INR therapeutic x48 hours.  Resume warfarin home dose, give 2.5mg  po tonight at 1800  Monitor daily INR, CBC, and clinical status.   Isaias Sakai, Sherian Rein D PGY1 Pharmacy Resident  Please use AMION for clinical pharmacists numbers  02/07/2018      9:10 AM

## 2018-02-08 ENCOUNTER — Encounter (HOSPITAL_COMMUNITY): Payer: Self-pay | Admitting: General Practice

## 2018-02-08 DIAGNOSIS — R488 Other symbolic dysfunctions: Secondary | ICD-10-CM | POA: Diagnosis not present

## 2018-02-08 DIAGNOSIS — F319 Bipolar disorder, unspecified: Secondary | ICD-10-CM | POA: Diagnosis not present

## 2018-02-08 DIAGNOSIS — Z7901 Long term (current) use of anticoagulants: Secondary | ICD-10-CM | POA: Diagnosis not present

## 2018-02-08 DIAGNOSIS — E119 Type 2 diabetes mellitus without complications: Secondary | ICD-10-CM | POA: Diagnosis not present

## 2018-02-08 DIAGNOSIS — L03116 Cellulitis of left lower limb: Secondary | ICD-10-CM | POA: Diagnosis not present

## 2018-02-08 DIAGNOSIS — Z9889 Other specified postprocedural states: Secondary | ICD-10-CM | POA: Diagnosis not present

## 2018-02-08 DIAGNOSIS — M25572 Pain in left ankle and joints of left foot: Secondary | ICD-10-CM | POA: Diagnosis not present

## 2018-02-08 DIAGNOSIS — Z4789 Encounter for other orthopedic aftercare: Secondary | ICD-10-CM | POA: Diagnosis not present

## 2018-02-08 DIAGNOSIS — I10 Essential (primary) hypertension: Secondary | ICD-10-CM | POA: Diagnosis not present

## 2018-02-08 DIAGNOSIS — R279 Unspecified lack of coordination: Secondary | ICD-10-CM | POA: Diagnosis not present

## 2018-02-08 DIAGNOSIS — F329 Major depressive disorder, single episode, unspecified: Secondary | ICD-10-CM | POA: Diagnosis not present

## 2018-02-08 DIAGNOSIS — R2681 Unsteadiness on feet: Secondary | ICD-10-CM | POA: Diagnosis not present

## 2018-02-08 DIAGNOSIS — Z472 Encounter for removal of internal fixation device: Secondary | ICD-10-CM | POA: Diagnosis not present

## 2018-02-08 DIAGNOSIS — Z743 Need for continuous supervision: Secondary | ICD-10-CM | POA: Diagnosis not present

## 2018-02-08 DIAGNOSIS — M6281 Muscle weakness (generalized): Secondary | ICD-10-CM | POA: Diagnosis not present

## 2018-02-08 DIAGNOSIS — G8918 Other acute postprocedural pain: Secondary | ICD-10-CM | POA: Diagnosis not present

## 2018-02-08 DIAGNOSIS — E1169 Type 2 diabetes mellitus with other specified complication: Secondary | ICD-10-CM | POA: Diagnosis not present

## 2018-02-08 DIAGNOSIS — R262 Difficulty in walking, not elsewhere classified: Secondary | ICD-10-CM | POA: Diagnosis not present

## 2018-02-08 DIAGNOSIS — G4700 Insomnia, unspecified: Secondary | ICD-10-CM | POA: Diagnosis not present

## 2018-02-08 DIAGNOSIS — M25579 Pain in unspecified ankle and joints of unspecified foot: Secondary | ICD-10-CM | POA: Diagnosis not present

## 2018-02-08 DIAGNOSIS — R278 Other lack of coordination: Secondary | ICD-10-CM | POA: Diagnosis not present

## 2018-02-08 DIAGNOSIS — Z952 Presence of prosthetic heart valve: Secondary | ICD-10-CM | POA: Diagnosis not present

## 2018-02-08 LAB — PROTIME-INR
INR: 2.15
Prothrombin Time: 23.9 seconds — ABNORMAL HIGH (ref 11.4–15.2)

## 2018-02-08 MED ORDER — WARFARIN SODIUM 2.5 MG PO TABS
2.5000 mg | ORAL_TABLET | Freq: Once | ORAL | Status: AC
  Administered 2018-02-08: 2.5 mg via ORAL
  Filled 2018-02-08: qty 1

## 2018-02-08 NOTE — Progress Notes (Signed)
Physical Therapy Treatment Patient Details Name: [name] MRN: [id number] DOB: 01/03/1951 Today's Date: 02/08/2018    History of Present Illness Pt is a 67 y.o. F with significant PMH of St. Jude AVR and atrial fibrillation who was admitted for left tibial hardware removal on 9/11    PT Comments    Pt performed gait training and functional mobility.  She remains to require min guard to min assist for transfers and gait training.  Pt is slow and guarded.  LOB noted in standing during pericare.  Pt remains unable to return home as she continues to require assistance for safety.  SNF for short term rehab remains appropriate to improve strength and function before returning home.  Her husband is in re[facility] at [facility] and her daughter cannot stay home with pt so rehab short term is the best option.    Follow Up Recommendations  SNF;Supervision for mobility/OOB     Equipment Recommendations  3in1 (PT)    Recommendations for Other Services       Precautions / Restrictions Precautions Precautions: Fall Required Braces or Orthoses: (Pt has CAM boot in room but no order for use.  ) Restrictions Weight Bearing Restrictions: Yes LLE Weight Bearing: Weight bearing as tolerated(Pt is ordered for NWB in chart but ortho note remains to state WBAT.  )    Mobility  Bed Mobility Overal bed mobility: Needs Assistance Bed Mobility: Supine to Sit     Supine to sit: Min assist;HOB elevated     General bed mobility comments: Pt with HOB elevated and posterior pelvic tilt causing posterior lean.  Required assistance to maintain upper trunk to avoid LOB when scooting to edge of bed.  Once feet planted on floor she was able to maintain balance unassisted.    Transfers Overall transfer level: Needs assistance Equipment used: Rolling walker (2 wheeled) Transfers: Sit to/from Stand Sit to Stand: Min assist         General transfer comment: Min assistance to power up into standing and to maintain  standing.  LOB during standing pericare required min assistance to correct.    Ambulation/Gait Ambulation/Gait assistance: Min guard Gait Distance (Feet): 80 Feet Assistive device: Rolling walker (2 wheeled) Gait Pattern/deviations: Step-to pattern;Step-through pattern;Shuffle;Trunk flexed;Antalgic Gait velocity: decreased   General Gait Details: Pt required cues for RW safety and proximity, cues for increased step height on RLE.     Stairs             Wheelchair Mobility    Modified Rankin (Stroke Patients Only)       Balance Overall balance assessment: Needs assistance   Sitting balance-Leahy Scale: Poor Sitting balance - Comments: improved to good with B feet grounded.       Standing balance-Leahy Scale: Poor Standing balance comment: LOB in static standing.                              Cognition Arousal/Alertness: Awake/alert;Suspect due to medications Behavior During Therapy: [facility] for tasks assessed/performed Overall Cognitive Status: Impaired/Different from baseline Area of Impairment: Following commands;Problem solving;Awareness;Memory                     Memory: Decreased short-term memory(reports telling me her husband is in rehab here then at the end of treatment retelling with no recollection of having this conversation.  ) Following Commands: Follows one step commands with increased time   Awareness: Emergent Problem Solving: Slow  processing;Requires verbal cues General Comments: Pt with decreased awareness and problem solving. requiring increased time and cues. Daughter reports this is different from pt baseline      Exercises General Exercises - Lower Extremity Ankle Circles/Pumps: AROM;Both;20 reps;Supine Quad Sets: AROM;Both;10 reps;Supine Long Arc Quad: AROM;Both;10 reps;Supine Hip Flexion/Marching: AROM;Both;10 reps;Supine    General Comments        Pertinent Vitals/Pain Pain Assessment: Faces Faces Pain Scale:  Hurts little more Pain Location: LLE Pain Descriptors / Indicators: Discomfort;Grimacing;Guarding Pain Intervention(s): Monitored during session;Repositioned;Ice applied    Home Living                      Prior Function            PT Goals (current goals can now be found in the care plan section) Acute Rehab PT Goals Patient Stated Goal: "Go to rehab" Potential to Achieve Goals: Good Progress towards PT goals: Progressing toward goals    Frequency    Min 5X/week      PT Plan Current plan remains appropriate    Co-evaluation              AM-PAC PT "6 Clicks" Daily Activity  Outcome Measure  Difficulty turning over in bed (including adjusting bedclothes, sheets and blankets)?: A Little Difficulty moving from lying on back to sitting on the side of the bed? : Unable Difficulty sitting down on and standing up from a chair with arms (e.g., wheelchair, bedside commode, etc,.)?: Unable Help needed moving to and from a bed to chair (including a wheelchair)?: A Little Help needed walking in hospital room?: A Little Help needed climbing 3-5 steps with a railing? : A Lot 6 Click Score: 13    End of Session Equipment Utilized During Treatment: Gait belt Activity Tolerance: Patient tolerated treatment well Patient left: in bed;with call bell/phone within reach Nurse Communication: Mobility status PT Visit Diagnosis: Other abnormalities of gait and mobility (R26.89);Difficulty in walking, not elsewhere classified (R26.2);Pain Pain - Right/Left: Left Pain - part of body: Leg     Time: 7711-6579 PT Time Calculation (min) (ACUTE ONLY): 26 min  Charges:  $Gait Training: 8-22 mins $Therapeutic Exercise: 8-22 mins                     [name], PTA Acute Rehabilitation Services Pager [phone] Office [phone]     [name] 02/08/2018, 11:15 AM

## 2018-02-08 NOTE — Clinical Social Work Placement (Signed)
   CLINICAL SOCIAL WORK PLACEMENT  NOTE  Date:  02/08/2018  Patient Details  Name: [name] MRN: [id number] Date of Birth: Dec 14, 1950  Clinical Social Work is seeking post-discharge placement for this patient at the [place] level of care (*CSW will initial, date and re-position this form in  chart as items are completed):      Patient/family provided with [place] Work Department's list of facilities offering this level of care within the geographic area requested by the patient (or if unable, by the patient's family).  Yes   Patient/family informed of their freedom to choose among providers that offer the needed level of care, that participate in Medicare, Medicaid or managed care program needed by the patient, have an available bed and are willing to accept the patient.      Patient/family informed of [facility]'s ownership interest in [facility] and [facility], as well as of the fact that they are under no obligation to receive care at these facilities.  PASRR submitted to EDS on       PASRR number received on 02/08/18     Existing PASRR number confirmed on       FL2 transmitted to all facilities in geographic area requested by pt/family on       FL2 transmitted to all facilities within larger geographic area on       Patient informed that his/her managed care company has contracts with or will negotiate with certain facilities, including the following:        Yes   Patient/family informed of bed offers received.  Patient chooses bed at [facility]     Physician recommends and patient chooses bed at      Patient to be transferred to [facility] on 02/08/18.  Patient to be transferred to facility by PTAR     Patient family notified on 02/08/18 of transfer.  Name of family member notified:  Amy(daughter)     PHYSICIAN       Additional Comment:    _______________________________________________ Vinie Sill,  [place] 02/08/2018, 4:48 PM

## 2018-02-08 NOTE — Progress Notes (Addendum)
Pt will DC to: [place] date:02/08/2018 Family notified: Amy (daughter) Transport CW:[facility]  RN, patient, and facility notified of DC. Discharge Summary sent to facility. RN given number for report. DC packet on [phone], room 204-B . Ambulance transport requested for patient.   CSW signing off. Thurmond Butts, [place] Social Worker [phone]

## 2018-02-08 NOTE — Plan of Care (Signed)
  Problem: Activity: Goal: Risk for activity intolerance will decrease Outcome: Progressing   Problem: Elimination: Goal: Will not experience complications related to bowel motility Outcome: Progressing   Problem: Safety: Goal: Ability to remain free from injury will improve Outcome: Progressing   Problem: Skin Integrity: Goal: Risk for impaired skin integrity will decrease Outcome: Progressing   

## 2018-02-08 NOTE — Progress Notes (Signed)
Subjective:     Patient reports pain as mild to moderate.  Tolerating POs well.  Admits to flatus.  Denies fever, chills, N/V, CP, SOB.  Objective:   VITALS:  Temp:  [98.3 F (36.8 C)-99.3 F (37.4 C)] 98.3 F (36.8 C) (09/16 0411) Pulse Rate:  [70-73] 72 (09/16 0411) Resp:  [14-16] 14 (09/16 0411) BP: (135-171)/(56-71) 135/62 (09/16 0411) SpO2:  [95 %-96 %] 96 % (09/16 0411)  General: WDWN patient in NAD. Psych:  Appropriate mood and affect. Neuro:  A&O x 3, Moving all extremities, sensation intact to light touch HEENT:  EOMs intact Chest:  Even non-labored respirations Skin:  Incision C/D/I, no rashes or lesions Extremities: warm/dry, mild edema L LE, no erythema or echymosis.  No lymphadenopathy. Pulses: Popliteus 2+ MSK:  ROM: EHL/FHL intact, MMT: able to perform quad set, (-) Homan's    LABS Recent Labs    02/07/18 0431  HGB 10.7*  WBC 6.9  PLT 195   No results for input(s): NA, K, CL, CO2, BUN, CREATININE, GLUCOSE in the last 72 hours. Recent Labs    02/07/18 0431 02/08/18 0319  INR 1.97 2.15     Assessment/Plan:    S/P L ankle hardware removal Post-op pain  WBAT L LE Up with therapy D/C to SNF when bed available.  Please inform me when patient is ready and I will place D/C order. Scripts on chart  Mohammed Kindle Office:  [phone]

## 2018-02-08 NOTE — Progress Notes (Signed)
ANTICOAGULATION CONSULT NOTE - Follow Up Consult  Pharmacy Consult for Lovenox and Warfarin Indication: atrial fibrillation and St Jude AVR  Allergies  Allergen Reactions  . Mucinex [Guaifenesin Er] Other (See Comments)    Severe headaches   . Keflex [Cephalexin] Other (See Comments)    Headaches and dizziness  . Sulfonamide Derivatives Other (See Comments)    Headaches     Patient Measurements: Height: 5\' 8"  (172.7 cm) Weight: 240 lb (108.9 kg) IBW/kg (Calculated) : 63.9  Vital Signs: Temp: 98.3 F (36.8 C) (09/16 0411) Temp Source: Oral (09/16 0411) BP: 135/62 (09/16 0411) Pulse Rate: 72 (09/16 0411)  Labs: Recent Labs    02/06/18 0402 02/07/18 0431 02/08/18 0319  HGB  --  10.7*  --   HCT  --  33.5*  --   PLT  --  195  --   LABPROT 20.1* 22.3* 23.9*  INR 1.73 1.97 2.15    Estimated Creatinine Clearance: 67.2 mL/min (A) (by C-G formula based on SCr of 1.05 mg/dL (H)).  Assessment: 67 year old female on chronic warfarin prior to admission for a mechanical St. Jude AVR and atrial fibrillation who was admitted for L-tibial hardware removal on 9/11. Warfarin was held for 5 days pre-operatively and patient was initiated on Lovenox bridge therapy.   Lovenox and Warfarin were resumed post-operatively on 9/12. CBC low stable. Nursing confirms no signs/symptoms of bleeding.   INR therapeutic now this AM at 2.15.  No bleeding reported at this time.  Home regimen prior to admission: Warfarin 2.5mg  daily except 5mg  on Tuesdays and Saturdays.   Goal of Therapy:  INR 2.5-3.5 Monitor platelets by anticoagulation protocol: Yes   Plan:  Warfarin 2.5mg  PO x 1 tonight Continue lovenox 110mg  subQ every 12 hours (Until INR therapeutic x48h) Daily INR, s/s bleeding  Bertis Ruddy, PharmD Clinical Pharmacist Please check AMION for all [place] numbers 02/08/2018 9:16 AM

## 2018-02-08 NOTE — Progress Notes (Signed)
AVS, printed prescriptions, and social worker's paperwork placed in d/c packet. Report called and given to Verline Lema, Therapist, sports at [facility]. All questions answered to satisfaction. Awaiting PTAR for transportation. Will continue to monitor.

## 2018-02-09 ENCOUNTER — Telehealth: Payer: Self-pay

## 2018-02-09 DIAGNOSIS — I10 Essential (primary) hypertension: Secondary | ICD-10-CM | POA: Diagnosis not present

## 2018-02-09 DIAGNOSIS — Z952 Presence of prosthetic heart valve: Secondary | ICD-10-CM | POA: Diagnosis not present

## 2018-02-09 DIAGNOSIS — R2681 Unsteadiness on feet: Secondary | ICD-10-CM | POA: Diagnosis not present

## 2018-02-09 DIAGNOSIS — M25572 Pain in left ankle and joints of left foot: Secondary | ICD-10-CM | POA: Diagnosis not present

## 2018-02-09 DIAGNOSIS — Z7901 Long term (current) use of anticoagulants: Secondary | ICD-10-CM | POA: Diagnosis not present

## 2018-02-09 NOTE — Telephone Encounter (Signed)
02/09/18  TCM call made. Left message for patient to return call,

## 2018-02-09 NOTE — Telephone Encounter (Signed)
TCM call made to patient. Patient states she is currently in a rehab facility where she will be for 19 more days.

## 2018-02-10 ENCOUNTER — Telehealth: Payer: Self-pay | Admitting: *Deleted

## 2018-02-10 DIAGNOSIS — M25572 Pain in left ankle and joints of left foot: Secondary | ICD-10-CM | POA: Diagnosis not present

## 2018-02-10 DIAGNOSIS — Z952 Presence of prosthetic heart valve: Secondary | ICD-10-CM | POA: Diagnosis not present

## 2018-02-10 DIAGNOSIS — I10 Essential (primary) hypertension: Secondary | ICD-10-CM | POA: Diagnosis not present

## 2018-02-10 DIAGNOSIS — Z9889 Other specified postprocedural states: Secondary | ICD-10-CM | POA: Diagnosis not present

## 2018-02-10 NOTE — Telephone Encounter (Signed)
Patient called to report that she missed her Appt and she is in [facility] and they are managing her Warfarin. Advised her that since they are managing her Warfarin she will need to let us know once she is discharged so we can resume care and she verbalized understanding.

## 2018-02-11 DIAGNOSIS — M25572 Pain in left ankle and joints of left foot: Secondary | ICD-10-CM | POA: Diagnosis not present

## 2018-02-11 DIAGNOSIS — R2681 Unsteadiness on feet: Secondary | ICD-10-CM | POA: Diagnosis not present

## 2018-02-12 ENCOUNTER — Other Ambulatory Visit: Payer: Self-pay | Admitting: Family Medicine

## 2018-02-15 DIAGNOSIS — L03116 Cellulitis of left lower limb: Secondary | ICD-10-CM | POA: Diagnosis not present

## 2018-02-15 DIAGNOSIS — Z952 Presence of prosthetic heart valve: Secondary | ICD-10-CM | POA: Diagnosis not present

## 2018-02-15 DIAGNOSIS — R2681 Unsteadiness on feet: Secondary | ICD-10-CM | POA: Diagnosis not present

## 2018-02-15 DIAGNOSIS — Z7901 Long term (current) use of anticoagulants: Secondary | ICD-10-CM | POA: Diagnosis not present

## 2018-02-15 DIAGNOSIS — M25572 Pain in left ankle and joints of left foot: Secondary | ICD-10-CM | POA: Diagnosis not present

## 2018-02-16 DIAGNOSIS — G4700 Insomnia, unspecified: Secondary | ICD-10-CM | POA: Diagnosis not present

## 2018-02-19 DIAGNOSIS — M25572 Pain in left ankle and joints of left foot: Secondary | ICD-10-CM | POA: Diagnosis not present

## 2018-02-19 DIAGNOSIS — I10 Essential (primary) hypertension: Secondary | ICD-10-CM | POA: Diagnosis not present

## 2018-02-19 DIAGNOSIS — F319 Bipolar disorder, unspecified: Secondary | ICD-10-CM | POA: Diagnosis not present

## 2018-02-19 DIAGNOSIS — R2681 Unsteadiness on feet: Secondary | ICD-10-CM | POA: Diagnosis not present

## 2018-02-19 DIAGNOSIS — G4700 Insomnia, unspecified: Secondary | ICD-10-CM | POA: Diagnosis not present

## 2018-02-23 DIAGNOSIS — I10 Essential (primary) hypertension: Secondary | ICD-10-CM | POA: Diagnosis not present

## 2018-02-23 DIAGNOSIS — Z952 Presence of prosthetic heart valve: Secondary | ICD-10-CM | POA: Diagnosis not present

## 2018-02-23 DIAGNOSIS — Z7901 Long term (current) use of anticoagulants: Secondary | ICD-10-CM | POA: Diagnosis not present

## 2018-02-25 DIAGNOSIS — Z952 Presence of prosthetic heart valve: Secondary | ICD-10-CM | POA: Diagnosis not present

## 2018-02-25 DIAGNOSIS — Z7901 Long term (current) use of anticoagulants: Secondary | ICD-10-CM | POA: Diagnosis not present

## 2018-02-25 DIAGNOSIS — E119 Type 2 diabetes mellitus without complications: Secondary | ICD-10-CM | POA: Diagnosis not present

## 2018-02-26 DIAGNOSIS — Z952 Presence of prosthetic heart valve: Secondary | ICD-10-CM | POA: Diagnosis not present

## 2018-02-26 DIAGNOSIS — I10 Essential (primary) hypertension: Secondary | ICD-10-CM | POA: Diagnosis not present

## 2018-02-26 DIAGNOSIS — F319 Bipolar disorder, unspecified: Secondary | ICD-10-CM | POA: Diagnosis not present

## 2018-02-26 DIAGNOSIS — Z9889 Other specified postprocedural states: Secondary | ICD-10-CM | POA: Diagnosis not present

## 2018-03-01 DIAGNOSIS — R2689 Other abnormalities of gait and mobility: Secondary | ICD-10-CM | POA: Diagnosis not present

## 2018-03-01 DIAGNOSIS — M6281 Muscle weakness (generalized): Secondary | ICD-10-CM | POA: Diagnosis not present

## 2018-03-02 ENCOUNTER — Ambulatory Visit (INDEPENDENT_AMBULATORY_CARE_PROVIDER_SITE_OTHER): Payer: PPO | Admitting: *Deleted

## 2018-03-02 DIAGNOSIS — I4891 Unspecified atrial fibrillation: Secondary | ICD-10-CM | POA: Diagnosis not present

## 2018-03-02 DIAGNOSIS — Z952 Presence of prosthetic heart valve: Secondary | ICD-10-CM | POA: Diagnosis not present

## 2018-03-02 DIAGNOSIS — Z5181 Encounter for therapeutic drug level monitoring: Secondary | ICD-10-CM | POA: Diagnosis not present

## 2018-03-02 LAB — POCT INR: INR: 2.6 (ref 2.0–3.0)

## 2018-03-02 NOTE — Patient Instructions (Signed)
Description   Continue same dose of coumadin 1/2 tablet daily except 1 tablet on Tuesdays and Saturdays.  Recheck INR in 1 week.     Call with antibiotic instructions, name, and how long you will have to take [phone]

## 2018-03-03 ENCOUNTER — Other Ambulatory Visit: Payer: Self-pay

## 2018-03-03 NOTE — Patient Outreach (Signed)
[facility]) Care Management  03/03/2018  KELLYE MIZNER [birth year] [id number]  Transition of care  Referral date: 03/02/18 Referral source: discharged from an inpatient admission from [facility] and [place] on 02/26/18 Insurance: Health team advantage Attempt #1  Telephone call to patient regarding referral. Unable to reach patient. HIPAA compliant voice message left with call back phone number.   PLAN: RNCM will attempt 2nd telephone call to patient within 4 business days. RNCM will send outreach letter.   Quinn Plowman RN,BSN, [place] Telephonic  [phone]

## 2018-03-04 ENCOUNTER — Telehealth: Payer: Self-pay

## 2018-03-04 NOTE — Telephone Encounter (Signed)
Called patient to schedule hospital follow up visit post her hospitalization and her rehab stay. Left message for return call.

## 2018-03-05 NOTE — Telephone Encounter (Signed)
Pt calling back to speak with Santiago Glad. [phone]

## 2018-03-05 NOTE — Telephone Encounter (Signed)
Appointment for Hospital follow up scheduled with patient.

## 2018-03-08 ENCOUNTER — Other Ambulatory Visit: Payer: Self-pay

## 2018-03-08 NOTE — Patient Outreach (Signed)
[facility]) Care Management  03/08/2018  [name] 1950/06/06 [id number]  Transition of care  Referral date: 03/02/18 Referral source: discharged from an inpatient admission from [facility] and [place] on 02/26/18 Insurance: Health team advantage Attempt #1  Telephone call to patient regarding referral. Unable to reach patient. HIPAA compliant voice message left with call back phone number.   PLAN: RNCM will attempt 2nd telephone call to patient within 4 business days. RNCM will send outreach letter.   Quinn Plowman RN,BSN, [place] Telephonic  [phone]

## 2018-03-09 ENCOUNTER — Ambulatory Visit: Payer: Self-pay

## 2018-03-09 ENCOUNTER — Other Ambulatory Visit: Payer: Self-pay

## 2018-03-09 NOTE — Patient Outreach (Signed)
[facility]) Care Management  03/09/2018  [name] [birth year] [id number]  Transition of care  Referral date:03/02/18 Referral source:discharged from an inpatient admission from [facility] and [place] on 02/26/18 Insurance:Health team advantage Attempt #2  Telephone call to patient regarding referral. Unable to reach patient. HIPAA compliant voice message left with call back phone number.   PLAN: RNCM will attempt 3rd telephone call to patient within 4 business days.   Quinn Plowman RN,BSN, [place] Telephonic  [phone]

## 2018-03-11 ENCOUNTER — Other Ambulatory Visit: Payer: Self-pay

## 2018-03-11 NOTE — Patient Outreach (Signed)
[facility]) Care Management  03/11/2018  [name] [birth year] [id number]   Transition of care  Referral date:03/02/18 Referral source:discharged from an inpatient admission from [facility] and [place] on 02/26/18 Insurance:Health team advantage  Telephone call to patient regarding transition of care follow up. HIPAA verified by patient. Explained reason for call.   Patient states she was in the nursing facility for rehab and due to left ankle surgical incision infection. Patient states she had a follow up visit with the orthopedic surgeon on yesterday. She states the surgeon discontinued daily dressing changes.  She states she is not wearing a protective boot at this time. Patient states her doctor prescribed her a cream to apply to the incision. She states the prescription will not be available until 03/12/18.  Patient states she has one more day of oral antibiotic to complete.  Patient reports having 1 visit from a home health nurse.  Patient states home health services was not needed. Patient report she is providing her own transportation to appointments. Patient states she feels she is healing well.  RNCM offered ongoing transition of care follow up.  Patient refused. Patient denies having any further needs.   PLAN; RNCM will close patient due to refusal of services.  RNCM will send patient [facility] care management brochure / magnet RNCM will send patients primary MD closure notification   Quinn Plowman RN,BSN,CCM [facility] Telephonic  [phone]

## 2018-03-16 ENCOUNTER — Ambulatory Visit: Payer: PPO

## 2018-03-16 DIAGNOSIS — Z5181 Encounter for therapeutic drug level monitoring: Secondary | ICD-10-CM | POA: Diagnosis not present

## 2018-03-16 DIAGNOSIS — Z952 Presence of prosthetic heart valve: Secondary | ICD-10-CM | POA: Diagnosis not present

## 2018-03-16 DIAGNOSIS — I4891 Unspecified atrial fibrillation: Secondary | ICD-10-CM

## 2018-03-16 LAB — POCT INR: INR: 1.9 — AB (ref 2.0–3.0)

## 2018-03-16 NOTE — Patient Instructions (Signed)
Please take your regular dose of coumadin tonight, take a whole tablet tomorrow, then continue same dose of coumadin 1/2 tablet daily except 1 tablet on Tuesdays and Saturdays.  Recheck INR in 2 weeks.

## 2018-03-18 ENCOUNTER — Other Ambulatory Visit: Payer: Self-pay | Admitting: Pharmacist

## 2018-03-18 ENCOUNTER — Ambulatory Visit (INDEPENDENT_AMBULATORY_CARE_PROVIDER_SITE_OTHER): Payer: PPO | Admitting: Family Medicine

## 2018-03-18 VITALS — BP 124/64 | HR 65 | Temp 98.2°F | Resp 18 | Wt 243.4 lb

## 2018-03-18 DIAGNOSIS — G8929 Other chronic pain: Secondary | ICD-10-CM

## 2018-03-18 DIAGNOSIS — F319 Bipolar disorder, unspecified: Secondary | ICD-10-CM

## 2018-03-18 DIAGNOSIS — E1169 Type 2 diabetes mellitus with other specified complication: Secondary | ICD-10-CM

## 2018-03-18 DIAGNOSIS — E1165 Type 2 diabetes mellitus with hyperglycemia: Secondary | ICD-10-CM

## 2018-03-18 DIAGNOSIS — Z23 Encounter for immunization: Secondary | ICD-10-CM

## 2018-03-18 DIAGNOSIS — D510 Vitamin B12 deficiency anemia due to intrinsic factor deficiency: Secondary | ICD-10-CM | POA: Diagnosis not present

## 2018-03-18 DIAGNOSIS — E785 Hyperlipidemia, unspecified: Secondary | ICD-10-CM | POA: Diagnosis not present

## 2018-03-18 DIAGNOSIS — I10 Essential (primary) hypertension: Secondary | ICD-10-CM | POA: Diagnosis not present

## 2018-03-18 DIAGNOSIS — E782 Mixed hyperlipidemia: Secondary | ICD-10-CM

## 2018-03-18 DIAGNOSIS — I4891 Unspecified atrial fibrillation: Secondary | ICD-10-CM | POA: Diagnosis not present

## 2018-03-18 DIAGNOSIS — N289 Disorder of kidney and ureter, unspecified: Secondary | ICD-10-CM

## 2018-03-18 DIAGNOSIS — M25572 Pain in left ankle and joints of left foot: Secondary | ICD-10-CM

## 2018-03-18 LAB — COMPREHENSIVE METABOLIC PANEL
ALT: 22 U/L (ref 0–35)
AST: 20 U/L (ref 0–37)
Albumin: 4.3 g/dL (ref 3.5–5.2)
Alkaline Phosphatase: 58 U/L (ref 39–117)
BUN: 19 mg/dL (ref 6–23)
CO2: 24 mEq/L (ref 19–32)
Calcium: 9.5 mg/dL (ref 8.4–10.5)
Chloride: 104 mEq/L (ref 96–112)
Creatinine, Ser: 0.81 mg/dL (ref 0.40–1.20)
GFR: 74.89 mL/min (ref >60.00)
Glucose, Bld: 195 mg/dL — ABNORMAL HIGH (ref 70–99)
Potassium: 4.1 mEq/L (ref 3.5–5.1)
Sodium: 139 mEq/L (ref 135–145)
Total Bilirubin: 0.5 mg/dL (ref 0.2–1.2)
Total Protein: 6.8 g/dL (ref 6.0–8.3)

## 2018-03-18 LAB — LIPID PANEL
Cholesterol: 185 mg/dL (ref 0–200)
HDL: 44.3 mg/dL (ref >39.00)
Total CHOL/HDL Ratio: 4
Triglycerides: 553 mg/dL — ABNORMAL HIGH (ref 0.0–149.0)

## 2018-03-18 LAB — CBC
HCT: 38.2 % (ref 36.0–46.0)
Hemoglobin: 12.8 g/dL (ref 12.0–15.0)
MCHC: 33.6 g/dL (ref 30.0–36.0)
MCV: 89.5 fl (ref 78.0–100.0)
Platelets: 254 10*3/uL (ref 150.0–400.0)
RBC: 4.26 Mil/uL (ref 3.87–5.11)
RDW: 14.3 % (ref 11.5–15.5)
WBC: 7.1 10*3/uL (ref 4.0–10.5)

## 2018-03-18 LAB — TSH: TSH: 2.46 u[IU]/mL (ref 0.35–4.50)

## 2018-03-18 LAB — HEMOGLOBIN A1C: Hgb A1c MFr Bld: 6.4 % (ref 4.6–6.5)

## 2018-03-18 LAB — LDL CHOLESTEROL, DIRECT: Direct LDL: 79 mg/dL

## 2018-03-18 NOTE — Assessment & Plan Note (Addendum)
Tearful but she will discuss changes with her psychiatrist

## 2018-03-18 NOTE — Assessment & Plan Note (Signed)
Much better since having surgery to remove hardware done by Dr Doran Durand, [facility]

## 2018-03-18 NOTE — Progress Notes (Signed)
Subjective:    Patient ID: [name], female    DOB: 02-28-1951, 67 y.o.   MRN: [id number]  No chief complaint on file.   HPI Patient is in today for follow up. She was in rehab for roughly 20 days after foot surgery with complications.  At this point all the hardware is out of her left foot and now that she is recovered feels much better.  She feels more mobile.  She is tearful in the visit due to her husband's poor health but overall feels she is managing fairly well with the help of her daughter.  She is considering reaching out to her psychiatrist for medication adjustment but does not endorse any suicidal ideation.  No recent febrile illness or acute concerns otherwise noted. No polyuria or polydipsia. Denies CP/palp/SOB/HA/congestion/fevers/GI or GU c/o. Taking meds as prescribed  Past Medical History:  Diagnosis Date  . ANEMIA 05/28/2010  . AORTIC VALVE REPLACEMENT, HX OF 03/11/2010   Mechanical prosthesis  . BIPOLAR AFFECTIVE DISORDER 03/11/2010  . Depression   . Diverticulitis 12/18/2010  . FIBROIDS, UTERUS 03/11/2010  . Gout 09/04/2016  . Grave's disease 8-12  . Hyperlipidemia associated with type 2 diabetes mellitus ([place]) 06/05/2016  . Hypertension   . Low back pain 11/02/2017  . Migraine 06/05/2016  . Mixed hyperlipidemia 03/11/2010  . Obesity   . UTI (lower urinary tract infection) 08/14/2011    Past Surgical History:  Procedure Laterality Date  . ABDOMINAL HYSTERECTOMY  ?1998   sb/l spo, total for fibroids/heavy bleeding  . ANKLE SURGERY Left 01/2018   hardware removal  . AORTIC VALVE REPLACEMENT  2011   Mechanical prosthesis, St. Jude  . APPENDECTOMY     Required revision for EColi infection, required recurrent packing  . bowel obstruction     Requiring adhesions to be removed  . CHOLECYSTECTOMY    . COLONOSCOPY WITH PROPOFOL N/A 11/01/2015   Procedure: COLONOSCOPY WITH PROPOFOL;  Surgeon: Milus Banister, MD;  Location: [facility];  Service: Endoscopy;   Laterality: N/A;  . CYSTOCELE REPAIR N/A 10/16/2015   Procedure: ANTERIOR REPAIR (CYSTOCELE);  Surgeon: Donnamae Jude, MD;  Location: [facility];  Service: Gynecology;  Laterality: N/A;  . HERNIA REPAIR  08-16-10  . LEFT HEART CATHETERIZATION WITH CORONARY ANGIOGRAM N/A 12/05/2011   Procedure: LEFT HEART CATHETERIZATION WITH CORONARY ANGIOGRAM;  Surgeon: Sinclair Grooms, MD;  Location[facility] LAB;  Service: Cardiovascular;  Laterality: N/A;  . TOTAL KNEE ARTHROPLASTY Right 02/24/2013   Procedure: RIGHT TOTAL KNEE ARTHROPLASTY;  Surgeon: Johnn Hai, MD;  Location: [facility];  Service: Orthopedics;  Laterality: Right;  . TUBAL LIGATION    . varicose vein surgery b/l legs      Family History  Problem Relation Age of Onset  . Scoliosis Mother   . Arthritis Mother        Rheumatoid  . Aneurysm Mother        heart  . Alcohol abuse Father   . Depression Sister   . Depression Brother   . Alcohol abuse Brother   . Cancer Maternal Grandmother        colon  . Diabetes Maternal Grandfather   . Heart disease Maternal Grandfather   . Alcohol abuse Maternal Grandfather   . Depression Paternal Grandmother   . Diabetes Paternal Grandmother   . Depression Paternal Grandfather   . Diabetes Paternal Grandfather   . Depression Sister   . Alcohol abuse Brother   . Depression Brother   .  Heart disease Brother     Social History   Socioeconomic History  . Marital status: Married    Spouse name: Not on file  . Number of children: Not on file  . Years of education: Not on file  . Highest education level: Not on file  Occupational History  . Not on file  Social Needs  . Financial resource strain: Not on file  . Food insecurity:    Worry: Not on file    Inability: Not on file  . Transportation needs:    Medical: Not on file    Non-medical: Not on file  Tobacco Use  . Smoking status: Former Smoker    Last attempt to quit: 05/26/1990    Years since quitting: 27.8  . Smokeless tobacco: Never  Used  Substance and Sexual Activity  . Alcohol use: Yes    Alcohol/week: 0.0 standard drinks    Comment: rare  . Drug use: No  . Sexual activity: Not Currently  Lifestyle  . Physical activity:    Days per week: Not on file    Minutes per session: Not on file  . Stress: Not on file  Relationships  . Social connections:    Talks on phone: Not on file    Gets together: Not on file    Attends religious service: Not on file    Active member of club or organization: Not on file    Attends meetings of clubs or organizations: Not on file    Relationship status: Not on file  . Intimate partner violence:    Fear of current or ex partner: Not on file    Emotionally abused: Not on file    Physically abused: Not on file    Forced sexual activity: Not on file  Other Topics Concern  . Not on file  Social History Narrative  . Not on file    Outpatient Medications Prior to Visit  Medication Sig Dispense Refill  . acetaminophen (TYLENOL) 500 MG tablet Take 1,000-1,500 mg by mouth every 6 (six) hours as needed (For knee pain.).    Marland Kitchen amLODipine (NORVASC) 5 MG tablet TAKE ONE TABLET BY MOUTH DAILY 90 tablet 3  . aspirin EC 81 MG tablet Take 81 mg by mouth daily.    . clonazePAM (KLONOPIN) 0.5 MG tablet Take 0.5 mg by mouth at bedtime as needed (Insomnia).     . cyclobenzaprine (FLEXERIL) 10 MG tablet Take 10 mg by mouth 3 (three) times daily as needed for muscle spasms.    Marland Kitchen docusate sodium (COLACE) 100 MG capsule Take 1 capsule (100 mg total) by mouth 2 (two) times daily. While taking narcotic pain medicine. (Patient not taking: Reported on 03/11/2018) 30 capsule 0  . enoxaparin (LOVENOX) 120 MG/0.8ML injection Inject 0.8 mLs (120 mg total) into the skin every 12 (twelve) hours. (Patient not taking: Reported on 03/11/2018) 20 Syringe 0  . fenofibrate 160 MG tablet Take 160 mg by mouth daily.    . furosemide (LASIX) 40 MG tablet TAKE ONE TABLET BY MOUTH DAILY *PLEASE KEEP UPCOMING APPOINTMENT*  (Patient taking differently: Take 40 mg by mouth daily. ) 30 tablet 11  . glucose blood (ONE TOUCH ULTRA TEST) test strip USE AS DIRECTED ONCE DAILY FOR BLOOD SUGAR 100 each 11  . lamoTRIgine (LAMICTAL) 200 MG tablet Take 100-200 mg by mouth See admin instructions. Take 100 mg by mouth in the morning and 200 mg by mouth at bedtime    . metoprolol succinate (TOPROL-XL) 25  MG 24 hr tablet Take 1 tablet (25 mg total) by mouth every morning. 90 tablet 0  . ONE TOUCH LANCETS MISC Test once daily to check blood sugar. DX E11.9 100 each 3  . OXcarbazepine (TRILEPTAL) 150 MG tablet Take 150 mg by mouth 2 (two) times daily.    . pantoprazole (PROTONIX) 40 MG tablet Take 1 tablet (40 mg total) by mouth daily. 90 tablet 3  . Polyethyl Glycol-Propyl Glycol (SYSTANE OP) Place 1 drop into both eyes at bedtime as needed (For dry eyes.).     Marland Kitchen Probiotic Product (ALIGN) 4 MG CAPS Take 4 mg by mouth daily.    Marland Kitchen rOPINIRole (REQUIP) 0.25 MG tablet TAKE ONE TO TWO TABLETS BY MOUTH AT BEDTIME 60 tablet 1  . rosuvastatin (CRESTOR) 40 MG tablet TAKE ONE TABLET BY MOUTH DAILY (Patient taking differently: Take 40 mg by mouth daily. ) 90 tablet 3  . senna (SENOKOT) 8.6 MG TABS tablet Take 2 tablets (17.2 mg total) by mouth 2 (two) times daily. (Patient not taking: Reported on 03/11/2018) 30 each 0  . SUMAtriptan (IMITREX) 50 MG tablet TAKE ONE TABLET EVERY 2 HOURS AS NEEDED FOR MIGRAINE. MAY REPEAT IN 2 HOURS IF HEADACHE PERSISTS OR RECURS. (MAX OF 2 TABLETS IN 24 HOURS) (Patient taking differently: Take 50 mg by mouth every 2 (two) hours as needed for migraine. ) 10 tablet 0  . tiZANidine (ZANAFLEX) 2 MG tablet Take 1 tablet (2 mg total) by mouth every 8 (eight) hours as needed for muscle spasms. (Patient not taking: Reported on 03/11/2018) 40 tablet 2  . venlafaxine XR (EFFEXOR-XR) 150 MG 24 hr capsule Take 150 mg by mouth every morning.    . warfarin (COUMADIN) 5 MG tablet TAKE AS [place] (Patient taking  differently: Take 5 mg by mouth daily at 6 PM. TAKE AS DIRECED BY [facility]) 90 tablet 1   No facility-administered medications prior to visit.     Allergies  Allergen Reactions  . Mucinex [Guaifenesin Er] Other (See Comments)    Severe headaches   . Keflex [Cephalexin] Other (See Comments)    Headaches and dizziness  . Sulfonamide Derivatives Other (See Comments)    Headaches     Review of Systems  Constitutional: Negative for chills, fever and malaise/fatigue.  HENT: Negative for congestion and hearing loss.   Eyes: Negative for discharge.  Respiratory: Negative for cough, sputum production and shortness of breath.   Cardiovascular: Negative for chest pain, palpitations and leg swelling.  Gastrointestinal: Negative for abdominal pain, blood in stool, constipation, diarrhea, heartburn, nausea and vomiting.  Genitourinary: Negative for dysuria, frequency, hematuria and urgency.  Musculoskeletal: Negative for back pain, falls and myalgias.  Skin: Negative for rash.  Neurological: Negative for dizziness, sensory change, loss of consciousness, weakness and headaches.  Endo/Heme/Allergies: Negative for environmental allergies. Does not bruise/bleed easily.  Psychiatric/Behavioral: Negative for depression and suicidal ideas. The patient is not nervous/anxious and does not have insomnia.        Objective:    Physical Exam  Constitutional: She is oriented to person, place, and time. She appears well-developed and well-nourished. No distress.  HENT:  Head: Normocephalic and atraumatic.  Nose: Nose normal.  Eyes: Right eye exhibits no discharge. Left eye exhibits no discharge.  Neck: Normal range of motion. Neck supple.  Cardiovascular: Normal rate.  Murmur heard. Pulmonary/Chest: Effort normal and breath sounds normal.  Abdominal: Soft. Bowel sounds are normal. There is no tenderness.  Musculoskeletal: She exhibits  no edema.  Neurological: She is alert and oriented to  person, place, and time.  Skin: Skin is warm and dry.  Psychiatric: She has a normal mood and affect.  Nursing note and vitals reviewed.   BP 124/64 (BP Location: Left Arm, Patient Position: Sitting, Cuff Size: Normal)   Pulse 65   Temp 98.2 F (36.8 C) (Oral)   Resp 18   Wt 243 lb 6.4 oz (110.4 kg)   SpO2 97%   BMI 37.01 kg/m  Wt Readings from Last 3 Encounters:  03/18/18 243 lb 6.4 oz (110.4 kg)  02/03/18 240 lb (108.9 kg)  11/02/17 254 lb 3.2 oz (115.3 kg)     Lab Results  Component Value Date   WBC 6.9 02/07/2018   HGB 10.7 (L) 02/07/2018   HCT 33.5 (L) 02/07/2018   PLT 195 02/07/2018   GLUCOSE 172 (H) 02/04/2018   CHOL 201 (H) 11/02/2017   TRIG (H) 11/02/2017    456.0 Triglyceride is over 400; calculations on Lipids are invalid.   HDL 46.10 11/02/2017   LDLDIRECT 94.0 11/02/2017   LDLCALC 71 04/04/2013   ALT 24 11/02/2017   AST 19 11/02/2017   NA 138 02/04/2018   K 3.6 02/04/2018   CL 101 02/04/2018   CREATININE 1.05 (H) 02/04/2018   BUN 10 02/04/2018   CO2 27 02/04/2018   TSH 3.24 11/02/2017   INR 1.9 (A) 03/16/2018   HGBA1C 7.4 (H) 11/02/2017    Lab Results  Component Value Date   TSH 3.24 11/02/2017   Lab Results  Component Value Date   WBC 6.9 02/07/2018   HGB 10.7 (L) 02/07/2018   HCT 33.5 (L) 02/07/2018   MCV 92.3 02/07/2018   PLT 195 02/07/2018   Lab Results  Component Value Date   NA 138 02/04/2018   K 3.6 02/04/2018   CO2 27 02/04/2018   GLUCOSE 172 (H) 02/04/2018   BUN 10 02/04/2018   CREATININE 1.05 (H) 02/04/2018   BILITOT 0.5 11/02/2017   ALKPHOS 66 11/02/2017   AST 19 11/02/2017   ALT 24 11/02/2017   PROT 6.8 11/02/2017   ALBUMIN 4.3 11/02/2017   CALCIUM 8.9 02/04/2018   ANIONGAP 10 02/04/2018   GFR 63.14 11/02/2017   Lab Results  Component Value Date   CHOL 201 (H) 11/02/2017   Lab Results  Component Value Date   HDL 46.10 11/02/2017   Lab Results  Component Value Date   LDLCALC 71 04/04/2013   Lab Results    Component Value Date   TRIG (H) 11/02/2017    456.0 Triglyceride is over 400; calculations on Lipids are invalid.   Lab Results  Component Value Date   CHOLHDL 4 11/02/2017   Lab Results  Component Value Date   HGBA1C 7.4 (H) 11/02/2017       Assessment & Plan:   Problem List Items Addressed This Visit    MIXED HYPERLIPIDEMIA    Encouraged heart healthy diet, increase exercise, avoid trans fats, consider a krill oil cap daily. Crestor continue.       Bipolar disorder ([place])    Tearful but she will discuss changes with her psychiatrist      Renal insufficiency    Cneck, cmp      Pernicious anemia    Supplement and monitor      Atrial fibrillation (HCC)    Rate controlled, tolerating coumadin      Essential hypertension, benign    Well controlled, no changes to meds. Encouraged heart  healthy diet such as the DASH diet and exercise as tolerated.       Relevant Orders   CBC   Comprehensive metabolic panel   TSH   Hyperlipidemia associated with type 2 diabetes mellitus ([place])    Tolerating statin, encouraged heart healthy diet, avoid trans fats, minimize simple carbs and saturated fats. Increase exercise as tolerated      Relevant Orders   Lipid panel   Diabetes ([place]) - Primary   Relevant Orders   Hemoglobin A1c   Ankle pain    Much better since having surgery to remove hardware done by Dr Doran Durand, [facility]         I am having Cindie Laroche. Dickens maintain her clonazePAM, ONE TOUCH LANCETS, aspirin EC, cyclobenzaprine, fenofibrate, ALIGN, Polyethyl Glycol-Propyl Glycol (SYSTANE OP), venlafaxine XR, acetaminophen, lamoTRIgine, tiZANidine, warfarin, amLODipine, furosemide, rosuvastatin, SUMAtriptan, pantoprazole, metoprolol succinate, enoxaparin, OXcarbazepine, senna, docusate sodium, rOPINIRole, and glucose blood.  No orders of the defined types were placed in this encounter.    Penni Homans, MD

## 2018-03-18 NOTE — Assessment & Plan Note (Signed)
Supplement and monitor 

## 2018-03-18 NOTE — Patient Outreach (Addendum)
[facility]) Care Management  [place]   03/18/2018  [name] June 17, 1950 [id number]  67 year old female referred to [facility] for 30 day discharge medication review.  PMHx includes, but not limited to, atrial fibrillation, HTN, hx migraines, T2DM, hypertriglyceridemia, hx mechanical aortic valve replacement, s/p left ankle hardware removal with intractable ankle pain following surgery. Admitted to [facility] for pain control s/p surgery from 9/11-9/16/2019, discharged to [facility] through 02/26/2018.   She notes that overall she is doing much better since being home. However, she notes that she is struggling recently with depression, and became extremely tearful while on the phone with me. Notes that she had f/u with Dr. Charlett Blake today and lab work done (has not resulted). Her psychiatric medications are managed by Thayer Headings, NP at [place]. She believes she has follow up with them in November, but indicates that she may want to be seen sooner.   Upon medication review, she has not been taking fenofibrate or venlafaxine. She is unsure if these medications had been discontinued. In terms of psychiatric/CNS active medications, she is currently taking clonazepam 0.5 mg at bedtime nightly, lamotrigine 100 mg BID, oxcarbazepine 150 mg BID, quetiapine XR 300 mg daily (she notes 600 mg daily is prescribed, but that would "knock her out"), and ropinirole 0.25 mg.    Lovenox removed from patient medication list as she has completed perioperative bridging.    Objective: Lab Results  Component Value Date   CREATININE 0.81 03/18/2018   CREATININE 1.05 (H) 02/04/2018   CREATININE 0.94 11/02/2017  eGFR 74 mL/min/1.54m; CrCl ~ 88 mL/min  Lab Results  Component Value Date   HGBA1C 6.4 03/18/2018    Lipid Panel     Component Value Date/Time   CHOL 185 03/18/2018 1202   TRIG (H) 03/18/2018 1202    553.0 Triglyceride is  over 400; calculations on Lipids are invalid.   HDL 44.30 03/18/2018 1202   CHOLHDL 4 03/18/2018 1202   VLDL 69.2 (H) 01/29/2017 1521   LDLCALC 71 04/04/2013 1622   LDLDIRECT 79.0 03/18/2018 1202    BP Readings from Last 3 Encounters:  03/18/18 124/64  02/08/18 (!) 142/63  11/02/17 136/68    Allergies  Allergen Reactions  . Mucinex [Guaifenesin Er] Other (See Comments)    Severe headaches   . Keflex [Cephalexin] Other (See Comments)    Headaches and dizziness  . Sulfonamide Derivatives Other (See Comments)    Headaches     Medications Reviewed Today    Reviewed [facility]head [facility] on 03/18/18 at 1[place] Knoll[place] Status: <None>  Medication Order Taking? Sig Documenting Provider Last Dose Status Informant  acetaminophen (TYLENOL) 500 MG tablet [id number] Take 1,000-1,500 mg by mouth every 6 (six) hours as needed (For knee pain.). [provider] Taking Active Multiple Informants           Med Note (Nat ChristenOct 24, 2019  4:16 PM) Taking 2 tablets (1000 mg daily)  amLODipine (NORVASC) 5 MG tablet [id number] TAKE ONE TABLET BY MOUTH DAILY SBelva Crome MD Taking Active Multiple Informants  aspirin EC 81 MG tablet [id number] Take 81 mg by mouth daily. [provider] Taking Active Multiple Informants  clonazePAM (KLONOPIN) 0.5 MG tablet [id number] Take 0.5 mg by mouth at bedtime as needed (Insomnia).  [provider] Taking Active Multiple Informants  Med Note Darnelle Maffucci, Arville Lime   Thu Mar 18, 2018  4:14 PM) Takes every evening  docusate sodium (COLACE) 100 MG capsule [id number] No Take 1 capsule (100 mg total) by mouth 2 (two) times daily. While taking narcotic pain medicine.  Patient not taking:  Reported on 03/11/2018   Corky Sing, [place] Not Taking Active   fenofibrate 160 MG tablet [id number] No Take 160 mg by mouth daily. [provider] Not Taking Active Multiple Informants   furosemide (LASIX) 40 MG tablet [id number] Yes TAKE ONE TABLET BY MOUTH DAILY *PLEASE KEEP UPCOMING APPOINTMENT*  Patient taking differently:  Take 40 mg by mouth daily.    Belva Crome, MD Taking Active Multiple Informants  glucose blood (ONE TOUCH ULTRA TEST) test strip [id number] Yes USE AS DIRECTED ONCE DAILY FOR BLOOD SUGAR Mosie Lukes, MD Taking Active   lamoTRIgine (LAMICTAL) 200 MG tablet [id number] Yes Take 100-200 mg by mouth See admin instructions. Take 100 mg by mouth in the morning and 200 mg by mouth at bedtime [provider] Taking Active Multiple Informants           Med Note Nat Christen Mar 18, 2018  4:15 PM) Taking 100 mg BID        Discontinued 08/05/11 1106 (Error)   metoprolol succinate (TOPROL-XL) 25 MG 24 hr tablet [id number] Yes Take 1 tablet (25 mg total) by mouth every morning. Mosie Lukes, MD Taking Active Multiple Informants  ONE [facility] LANCETS [place] [id number] Yes Test once daily to check blood sugar. DX E11.9 Mosie Lukes, MD Taking Active Multiple Informants  OXcarbazepine (TRILEPTAL) 150 MG tablet [id number] Yes Take 150 mg by mouth 2 (two) times daily. [provider] Taking Active Multiple Informants  pantoprazole (PROTONIX) 40 MG tablet [id number] Yes Take 1 tablet (40 mg total) by mouth daily. Mosie Lukes, MD Taking Active Multiple Informants  Polyethyl Glycol-Propyl Glycol (SYSTANE OP) [id number] Yes Place 1 drop into both eyes at bedtime as needed (For dry eyes.).  [provider] Taking Active Multiple Informants           Med Note Nat Christen Mar 18, 2018  4:19 PM) PRN  Probiotic Product (ALIGN) 4 MG CAPS [id number] No Take 4 mg by mouth daily. [provider] Not Taking Active Multiple Informants  QUEtiapine (SEROQUEL XR) 300 MG 24 hr tablet [id number] Yes Take 300 mg by mouth 2 (two) times daily. [provider] Taking Active            Med Note Nat Christen Mar 18, 2018  4:24 PM) Taking 300 mg daily  rOPINIRole (REQUIP) 0.25 MG tablet [id number] Yes TAKE ONE TO TWO TABLETS BY MOUTH AT BEDTIME Mosie Lukes, MD Taking Active   rosuvastatin (CRESTOR) 40 MG tablet [id number] Yes TAKE ONE TABLET BY MOUTH DAILY  Patient taking differently:  Take 40 mg by mouth daily.    Mosie Lukes, MD Taking Active Multiple Informants  senna (SENOKOT) 8.6 MG TABS tablet [id number] No Take 2 tablets (17.2 mg total) by mouth 2 (two) times daily.  Patient not taking:  Reported on 03/11/2018   Corky Sing, PA-C Not Taking Active   SUMAtriptan (IMITREX) 50 MG tablet [id number] No TAKE ONE TABLET EVERY 2 HOURS AS NEEDED FOR MIGRAINE. MAY REPEAT IN 2 HOURS IF HEADACHE PERSISTS OR RECURS. (MAX OF 2 TABLETS IN 24 HOURS)  Patient not taking:  No sig reported   Mosie Lukes, MD Not Taking Active Multiple Informants           Med Note Darnelle Maffucci, Maralyn Sago Mar 18, 2018  4:20 PM) Hasn't had a migraine "in a while"  venlafaxine XR (EFFEXOR-XR) 150 MG 24 hr capsule [id number] No Take 150 mg by mouth every morning. [provider] Not Taking Active Multiple Informants  warfarin (COUMADIN) 5 MG tablet [id number] Yes TAKE AS [place]  Patient taking differently:  Take 5 mg by mouth daily at 6 PM. TAKE AS [place]   Belva Crome, MD Taking Active Multiple Informants          Assessment:  Drugs sorted by system:  Neurologic/Psychologic: clonazepam, lamotrigine, oxcarbazepine, quetiapine, ropinirole   Cardiovascular: Aspirin, warfarin, amlodipine, furosemide, metoprolol, rosuvastatin  Gastrointestinal: pantoprazole  Pain: acetaminophen  Medication Review Findings:  . Hypertriglyceridemia: Patient has not been taking fenofibrate; on last check, TG elevated. Lipid panel currently pending.   Marland Kitchen Psychiatric Regimen: Patient confused about whether she is to be taking venlafaxine or not; patient reporting depressed mood  over the past few months    Plan: - Will route note to Dr. Charlett Blake for instruction regarding whether fenofibrate should be restarted, pending lipid panel results from today - Will contact psychiatry Thayer Headings, NP at [place]) regarding intended psychiatric therapy; will ask if possible for patient to be seen sooner for follow up.    Catie Darnelle Maffucci, PharmD PGY2 Ambulatory Care Pharmacy Resident, [facility] Phone: [phone]   Addendum: Updated labs with results from 03/18/2018. Removed recommendation for metformin, as patient's A1c is well controlled.

## 2018-03-18 NOTE — Patient Instructions (Signed)

## 2018-03-18 NOTE — Assessment & Plan Note (Signed)
C[place], cmp

## 2018-03-18 NOTE — Assessment & Plan Note (Signed)
Tolerating statin, encouraged heart healthy diet, avoid trans fats, minimize simple carbs and saturated fats. Increase exercise as tolerated 

## 2018-03-18 NOTE — Assessment & Plan Note (Signed)
Encouraged heart healthy diet, increase exercise, avoid trans fats, consider a krill oil cap daily. Crestor continue.

## 2018-03-18 NOTE — Assessment & Plan Note (Signed)
Rate controlled, tolerating coumadin 

## 2018-03-18 NOTE — Assessment & Plan Note (Signed)
Well controlled, no changes to meds. Encouraged heart healthy diet such as the DASH diet and exercise as tolerated.  °

## 2018-03-19 ENCOUNTER — Other Ambulatory Visit: Payer: Self-pay | Admitting: Interventional Cardiology

## 2018-03-19 ENCOUNTER — Other Ambulatory Visit: Payer: Self-pay | Admitting: Pharmacist

## 2018-03-19 MED ORDER — FENOFIBRATE MICRONIZED 130 MG PO CAPS: 130.0000 mg | ORAL_CAPSULE | Freq: Every day | ORAL | 3 refills | Status: DC

## 2018-03-19 NOTE — Patient Outreach (Signed)
[facility]) Care Management  03/19/2018  [name] [birth year] [id number]   Received call back from Thayer Headings, NP's office. Patient is not supposed to be on venlafaxine therapy. I have discontinued it from her medication list. I also relayed my concerns regarding patient's mood; they plan to call her today to assess and determine if sooner follow up is needed.   Per chart review, lipid panel has resulted and Dr. Charlett Blake would like patient to resume fenofibrate therapy.   Left HIPAA compliant message for patient to return my call.   Catie Darnelle Maffucci, PharmD PGY2 Ambulatory Care Pharmacy Resident, [place] [facility] Phone: [phone]

## 2018-03-19 NOTE — Addendum Note (Signed)
Addended by: Wynonia Musty A on: 03/19/2018 11:47 AM   Modules accepted: Orders

## 2018-03-22 NOTE — Progress Notes (Signed)
This is the patient we already talked to on Friday. I believe Vaughan Basta put her chart in your box then. Not sure if this is an old note from Friday?    Thanks

## 2018-03-23 ENCOUNTER — Other Ambulatory Visit: Payer: Self-pay | Admitting: Pharmacist

## 2018-03-23 NOTE — Patient Outreach (Signed)
[facility]) Care Management  03/23/2018  [name] 1950/08/25 [id number]  Received call from [name] at [facility] office [facility]). They will outreach the patient and try to work in to an earlier appointment.    Plan - Will close case - Patient has my contact information and can call with any questions or concerns.   Catie Darnelle Maffucci, PharmD PGY2 Ambulatory Care Pharmacy Resident, [place] [facility] Phone: [phone]

## 2018-03-25 ENCOUNTER — Ambulatory Visit: Payer: PPO | Admitting: Psychiatry

## 2018-03-25 ENCOUNTER — Encounter: Payer: Self-pay | Admitting: Psychiatry

## 2018-03-25 VITALS — BP 120/69 | HR 68

## 2018-03-25 DIAGNOSIS — F3181 Bipolar II disorder: Secondary | ICD-10-CM | POA: Diagnosis not present

## 2018-03-25 DIAGNOSIS — F411 Generalized anxiety disorder: Secondary | ICD-10-CM | POA: Diagnosis not present

## 2018-03-25 MED ORDER — VENLAFAXINE HCL ER 75 MG PO CP24: 75.0000 mg | ORAL_CAPSULE | Freq: Every day | ORAL | 1 refills | Status: DC

## 2018-03-25 MED ORDER — VENLAFAXINE HCL ER 37.5 MG PO CP24: ORAL_CAPSULE | ORAL | 0 refills | Status: DC

## 2018-03-25 NOTE — Progress Notes (Signed)
[name] [id number] Nov 12, 1950 67 y.o.  Subjective:   Patient ID:  [name] is a 67 y.o. (DOB Jun 26, 1950) female.  Chief Complaint:  Chief Complaint  Patient presents with  . Depression  . Anxiety    HPI [name] presents to the office today for follow-up of mood and anxiety. She reports "I have been so depressed." Husband was hospitalized for 50 days and in rehab for 20 days and this caused some depression. She reports difficulty "watching what he went through." She reports that she has been crying frequently and has periods where she is very agitated and irritated. She reports that she has been anxious. "Everything seems to be difficult." She reports no interest in her usual activities, like sewing, and spends most of her day sitting on the couch. Has had very low energy and motivation. She reports some difficulty with sleep initiation. Estimates sleeping 5-6 hours sleep, which is interrupted due to frequent urination. She reports that her appetite is "too good." She reports that she has been stress eating, typically at night. She reports poor concentration and notices she frequently loses her train of thought. Denies SI.   She reports "I'm always impulsive" and reports chronic difficulty with excessive spending. Reports spending more recently and that it happens more when she is depressed. Reports mood has been persistently depressed for about the past 2 months. Denies any recent manic s/s.   Had ankle surgery while husband was in rehab and reports that she developed a post-operative infection. She was briefly sent to rehab facility and said that Seroquel was reduced to 25 mg po QHS and she had severe insomnia. She reports that she had significantly elevated triglycerides. She reports that she decreased Seroquel XR to 300 mg for a period of time and increased dose to 600 mg a few weeks ago. Notices she can "go back to sleep easier" since increase in Seroquel XR.  Reports that  husband's health has been significantly improving.   Review of Systems:  Review of Systems  Gastrointestinal: Negative for constipation and diarrhea.  Musculoskeletal: Negative for gait problem.       Reports that her pain has significantly improved after ankle surgery  Neurological: Negative for tremors.  Psychiatric/Behavioral:       Please refer to HPI    Medications: I have reviewed the patient's current medications.  Current Outpatient Medications  Medication Sig Dispense Refill  . acetaminophen (TYLENOL) 500 MG tablet Take 1,000-1,500 mg by mouth every 6 (six) hours as needed (For knee pain.).    Marland Kitchen amLODipine (NORVASC) 5 MG tablet TAKE ONE TABLET BY MOUTH DAILY 90 tablet 3  . aspirin EC 81 MG tablet Take 81 mg by mouth daily.    . clonazePAM (KLONOPIN) 0.5 MG tablet Take 0.5 mg by mouth at bedtime as needed (Insomnia).     . fenofibrate 160 MG tablet Take 160 mg by mouth daily.    . furosemide (LASIX) 40 MG tablet TAKE ONE TABLET BY MOUTH DAILY *PLEASE KEEP UPCOMING APPOINTMENT* (Patient taking differently: Take 40 mg by mouth daily. ) 30 tablet 11  . glucose blood (ONE TOUCH ULTRA TEST) test strip USE AS DIRECTED ONCE DAILY FOR BLOOD SUGAR 100 each 11  . lamoTRIgine (LAMICTAL) 200 MG tablet Take 100-200 mg by mouth See admin instructions. Take 100 mg by mouth in the morning and 200 mg by mouth at bedtime    . metoprolol succinate (TOPROL-XL) 25 MG 24 hr tablet Take 1 tablet (  25 mg total) by mouth every morning. 90 tablet 0  . ONE TOUCH LANCETS MISC Test once daily to check blood sugar. DX E11.9 100 each 3  . OXcarbazepine (TRILEPTAL) 150 MG tablet Take 150 mg by mouth 2 (two) times daily.    . pantoprazole (PROTONIX) 40 MG tablet Take 1 tablet (40 mg total) by mouth daily. 90 tablet 3  . Polyethyl Glycol-Propyl Glycol (SYSTANE OP) Place 1 drop into both eyes at bedtime as needed (For dry eyes.).     Marland Kitchen QUEtiapine (SEROQUEL XR) 300 MG 24 hr tablet Take 300 mg by mouth 2 (two) times  daily.    Marland Kitchen rOPINIRole (REQUIP) 0.25 MG tablet TAKE ONE TO TWO TABLETS BY MOUTH AT BEDTIME 60 tablet 1  . rosuvastatin (CRESTOR) 40 MG tablet TAKE ONE TABLET BY MOUTH DAILY (Patient taking differently: Take 40 mg by mouth daily. ) 90 tablet 3  . warfarin (COUMADIN) 5 MG tablet TAKE AS DIRECTED BY COUMADIN CLINIC 90 tablet 0  . docusate sodium (COLACE) 100 MG capsule Take 1 capsule (100 mg total) by mouth 2 (two) times daily. While taking narcotic pain medicine. (Patient not taking: Reported on 03/11/2018) 30 capsule 0  . fenofibrate micronized (ANTARA) 130 MG capsule Take 1 capsule (130 mg total) by mouth daily before breakfast. (Patient not taking: Reported on 03/25/2018) 30 capsule 3  . Probiotic Product (ALIGN) 4 MG CAPS Take 4 mg by mouth daily.    Marland Kitchen senna (SENOKOT) 8.6 MG TABS tablet Take 2 tablets (17.2 mg total) by mouth 2 (two) times daily. (Patient not taking: Reported on 03/11/2018) 30 each 0  . SUMAtriptan (IMITREX) 50 MG tablet TAKE ONE TABLET EVERY 2 HOURS AS NEEDED FOR MIGRAINE. MAY REPEAT IN 2 HOURS IF HEADACHE PERSISTS OR RECURS. (MAX OF 2 TABLETS IN 24 HOURS) (Patient not taking: No sig reported) 10 tablet 0  . [START ON 04/02/2018] venlafaxine XR (EFFEXOR XR) 75 MG 24 hr capsule Take 1 capsule (75 mg total) by mouth daily with breakfast. 30 capsule 1  . venlafaxine XR (EFFEXOR-XR) 37.5 MG 24 hr capsule Take 1 capsule po q am x 1 week, then 2 capsules po q am 30 capsule 0   No current facility-administered medications for this visit.     Medication Side Effects: None  Allergies:  Allergies  Allergen Reactions  . Mucinex [Guaifenesin Er] Other (See Comments)    Severe headaches   . Keflex [Cephalexin] Other (See Comments)    Headaches and dizziness  . Sulfonamide Derivatives Other (See Comments)    Headaches     Past Medical History:  Diagnosis Date  . ANEMIA 05/28/2010  . AORTIC VALVE REPLACEMENT, HX OF 03/11/2010   Mechanical prosthesis  . BIPOLAR AFFECTIVE DISORDER  03/11/2010  . Depression   . Diverticulitis 12/18/2010  . FIBROIDS, UTERUS 03/11/2010  . Gout 09/04/2016  . Grave's disease 8-12  . Hyperlipidemia associated with type 2 diabetes mellitus ([place]) 06/05/2016  . Hypertension   . Low back pain 11/02/2017  . Migraine 06/05/2016  . Mixed hyperlipidemia 03/11/2010  . Obesity   . UTI (lower urinary tract infection) 08/14/2011    Family History  Problem Relation Age of Onset  . Scoliosis Mother   . Arthritis Mother        Rheumatoid  . Aneurysm Mother        heart  . Alcohol abuse Father   . Depression Sister   . Depression Brother   . Alcohol abuse Brother   .  Cancer Maternal Grandmother        colon  . Diabetes Maternal Grandfather   . Heart disease Maternal Grandfather   . Alcohol abuse Maternal Grandfather   . Depression Paternal Grandmother   . Diabetes Paternal Grandmother   . Depression Paternal Grandfather   . Diabetes Paternal Grandfather   . Depression Sister   . Alcohol abuse Brother   . Depression Brother   . Heart disease Brother     Social History   Socioeconomic History  . Marital status: Married    Spouse name: Not on file  . Number of children: Not on file  . Years of education: Not on file  . Highest education level: Not on file  Occupational History  . Not on file  Social Needs  . Financial resource strain: Not on file  . Food insecurity:    Worry: Not on file    Inability: Not on file  . Transportation needs:    Medical: Not on file    Non-medical: Not on file  Tobacco Use  . Smoking status: Former Smoker    Last attempt to quit: 05/26/1990    Years since quitting: 27.8  . Smokeless tobacco: Never Used  Substance and Sexual Activity  . Alcohol use: Yes    Alcohol/week: 0.0 standard drinks    Comment: rare  . Drug use: No  . Sexual activity: Not Currently  Lifestyle  . Physical activity:    Days per week: Not on file    Minutes per session: Not on file  . Stress: Not on file  Relationships   . Social connections:    Talks on phone: Not on file    Gets together: Not on file    Attends religious service: Not on file    Active member of club or organization: Not on file    Attends meetings of clubs or organizations: Not on file    Relationship status: Not on file  . Intimate partner violence:    Fear of current or ex partner: Not on file    Emotionally abused: Not on file    Physically abused: Not on file    Forced sexual activity: Not on file  Other Topics Concern  . Not on file  Social History Narrative  . Not on file    Past Medical History, Surgical history, Social history, and Family history were reviewed and updated as appropriate.   Please see review of systems for further details on the patient's review from today.   Objective:   Physical Exam:  BP 120/69   Pulse 68   Physical Exam  Constitutional: She is oriented to person, place, and time. She appears well-developed. No distress.  Musculoskeletal: She exhibits no deformity.  Neurological: She is alert and oriented to person, place, and time. Coordination normal.  Psychiatric: Her speech is normal and behavior is normal. Judgment and thought content normal. Her mood appears not anxious. Her affect is not angry, not blunt, not labile and not inappropriate. Cognition and memory are normal. She exhibits a depressed mood. She expresses no homicidal and no suicidal ideation. She expresses no suicidal plans and no homicidal plans.  Insight intact. No auditory or visual hallucinations. No delusions.     Lab Review:     Component Value Date/Time   NA 139 03/18/2018 1202   K 4.1 03/18/2018 1202   CL 104 03/18/2018 1202   CO2 24 03/18/2018 1202   GLUCOSE 195 (H) 03/18/2018 1202   BUN  19 03/18/2018 1202   CREATININE 0.81 03/18/2018 1202   CREATININE 0.89 06/20/2015 1049   CALCIUM 9.5 03/18/2018 1202   PROT 6.8 03/18/2018 1202   ALBUMIN 4.3 03/18/2018 1202   AST 20 03/18/2018 1202   ALT 22 03/18/2018 1202    ALKPHOS 58 03/18/2018 1202   BILITOT 0.5 03/18/2018 1202   GFRNONAA 54 (L) 02/04/2018 0821   GFRAA >60 02/04/2018 0821       Component Value Date/Time   WBC 7.1 03/18/2018 1202   RBC 4.26 03/18/2018 1202   HGB 12.8 03/18/2018 1202   HGB 12.0 06/16/2011 0953   HCT 38.2 03/18/2018 1202   HCT 35.6 06/16/2011 0953   PLT 254.0 03/18/2018 1202   PLT 233 06/16/2011 0953   MCV 89.5 03/18/2018 1202   MCV 86.6 06/16/2011 0953   MCH 29.5 02/07/2018 0431   MCHC 33.6 03/18/2018 1202   RDW 14.3 03/18/2018 1202   RDW 14.1 06/16/2011 0953   LYMPHSABS 1.9 02/04/2018 0821   LYMPHSABS 1.7 06/16/2011 0953   MONOABS 1.1 (H) 02/04/2018 0821   MONOABS 0.3 06/16/2011 0953   EOSABS 0.1 02/04/2018 0821   EOSABS 0.2 06/16/2011 0953   BASOSABS 0.1 02/04/2018 0821   BASOSABS 0.1 06/16/2011 0953    No results found for: POCLITH, LITHIUM   No results found for: PHENYTOIN, PHENOBARB, VALPROATE, CBMZ   .res Assessment: Plan:   Patient seen for 30 minutes and greater than 50% of visit spent counseling patient regarding treatment options for recent worsening in depressive signs and symptoms.  Reviewed current medications to determine if patient is taking medications as prescribed and she reports taking Seroquel, Lamictal, and Trileptal as directed.  Reviewed past records during visit and discussed that she had been on Effexor XR previously and notes and record indicate that mood was stable at that time.  Discussed potential benefits, risks, and side effects of Effexor XR and patient agrees to retrial.  Advised patient to contact office if she notices any hypomanic signs and symptoms to include sleep disturbance, mood lability, or impulsivity. Will continue Lamictal, Trileptal, Seroquel XR, and Klonopin as directed. Bipolar II disorder ([place]) - Plan: venlafaxine XR (EFFEXOR-XR) 37.5 MG 24 hr capsule, venlafaxine XR (EFFEXOR XR) 75 MG 24 hr capsule  Generalized anxiety disorder - Plan: venlafaxine XR  (EFFEXOR-XR) 37.5 MG 24 hr capsule, venlafaxine XR (EFFEXOR XR) 75 MG 24 hr capsule  Please see After Visit Summary for patient specific instructions.  Future Appointments  Date Time Provider [place]  03/30/2018  1:15 PM [facility]  06/21/2018 11:00 AM Mosie Lukes, MD [facility]    No orders of the defined types were placed in this encounter.     -------------------------------

## 2018-03-26 DIAGNOSIS — M545 Low back pain: Secondary | ICD-10-CM | POA: Diagnosis not present

## 2018-03-26 DIAGNOSIS — T8131XD Disruption of external operation (surgical) wound, not elsewhere classified, subsequent encounter: Secondary | ICD-10-CM | POA: Diagnosis not present

## 2018-03-26 DIAGNOSIS — M25572 Pain in left ankle and joints of left foot: Secondary | ICD-10-CM | POA: Diagnosis not present

## 2018-03-30 ENCOUNTER — Ambulatory Visit (INDEPENDENT_AMBULATORY_CARE_PROVIDER_SITE_OTHER): Payer: PPO | Admitting: Pharmacist

## 2018-03-30 ENCOUNTER — Other Ambulatory Visit: Payer: Self-pay | Admitting: Family Medicine

## 2018-03-30 DIAGNOSIS — Z5181 Encounter for therapeutic drug level monitoring: Secondary | ICD-10-CM | POA: Diagnosis not present

## 2018-03-30 DIAGNOSIS — I4891 Unspecified atrial fibrillation: Secondary | ICD-10-CM

## 2018-03-30 DIAGNOSIS — Z952 Presence of prosthetic heart valve: Secondary | ICD-10-CM | POA: Diagnosis not present

## 2018-03-30 LAB — POCT INR: INR: 2 (ref 2.0–3.0)

## 2018-03-30 NOTE — Patient Instructions (Signed)
Description   Take an extra 1/2 tablet today, then start taking 1/2 tablet daily except 1 tablet on Tuesdays, Thursdays, and Saturdays.  Recheck INR in 1 week.

## 2018-04-05 ENCOUNTER — Other Ambulatory Visit: Payer: Self-pay | Admitting: Psychiatry

## 2018-04-05 DIAGNOSIS — F3181 Bipolar II disorder: Secondary | ICD-10-CM

## 2018-04-05 DIAGNOSIS — F411 Generalized anxiety disorder: Secondary | ICD-10-CM

## 2018-04-05 DIAGNOSIS — M545 Low back pain: Secondary | ICD-10-CM | POA: Diagnosis not present

## 2018-04-12 ENCOUNTER — Ambulatory Visit: Payer: PPO

## 2018-04-12 DIAGNOSIS — Z5181 Encounter for therapeutic drug level monitoring: Secondary | ICD-10-CM | POA: Diagnosis not present

## 2018-04-12 DIAGNOSIS — Z952 Presence of prosthetic heart valve: Secondary | ICD-10-CM | POA: Diagnosis not present

## 2018-04-12 DIAGNOSIS — I4891 Unspecified atrial fibrillation: Secondary | ICD-10-CM | POA: Diagnosis not present

## 2018-04-12 LAB — POCT INR: INR: 2.6 (ref 2.0–3.0)

## 2018-04-12 NOTE — Patient Instructions (Signed)
Description   Continue on same dosage 1/2 tablet daily except 1 tablet on Tuesdays, Thursdays, and Saturdays.  Recheck INR in 3 weeks.

## 2018-04-14 DIAGNOSIS — M545 Low back pain: Secondary | ICD-10-CM | POA: Diagnosis not present

## 2018-04-25 ENCOUNTER — Encounter: Payer: Self-pay | Admitting: Emergency Medicine

## 2018-04-25 DIAGNOSIS — F411 Generalized anxiety disorder: Secondary | ICD-10-CM | POA: Insufficient documentation

## 2018-05-04 ENCOUNTER — Ambulatory Visit (INDEPENDENT_AMBULATORY_CARE_PROVIDER_SITE_OTHER): Payer: PPO | Admitting: *Deleted

## 2018-05-04 DIAGNOSIS — H2513 Age-related nuclear cataract, bilateral: Secondary | ICD-10-CM | POA: Diagnosis not present

## 2018-05-04 DIAGNOSIS — Z952 Presence of prosthetic heart valve: Secondary | ICD-10-CM | POA: Diagnosis not present

## 2018-05-04 DIAGNOSIS — I4891 Unspecified atrial fibrillation: Secondary | ICD-10-CM | POA: Diagnosis not present

## 2018-05-04 DIAGNOSIS — Z5181 Encounter for therapeutic drug level monitoring: Secondary | ICD-10-CM | POA: Diagnosis not present

## 2018-05-04 DIAGNOSIS — E119 Type 2 diabetes mellitus without complications: Secondary | ICD-10-CM | POA: Diagnosis not present

## 2018-05-04 DIAGNOSIS — H52203 Unspecified astigmatism, bilateral: Secondary | ICD-10-CM | POA: Diagnosis not present

## 2018-05-04 DIAGNOSIS — H25013 Cortical age-related cataract, bilateral: Secondary | ICD-10-CM | POA: Diagnosis not present

## 2018-05-04 DIAGNOSIS — H524 Presbyopia: Secondary | ICD-10-CM | POA: Diagnosis not present

## 2018-05-04 LAB — POCT INR: INR: 4.1 — AB (ref 2.0–3.0)

## 2018-05-04 LAB — HM DIABETES EYE EXAM

## 2018-05-04 NOTE — Patient Instructions (Signed)
Description   Do not take any Coumadin today then continue on same dosage 1/2 tablet daily except 1 tablet on Tuesdays, Thursdays, and Saturdays.  Recheck INR in 3 weeks.

## 2018-05-07 ENCOUNTER — Encounter: Payer: Self-pay | Admitting: Psychiatry

## 2018-05-07 ENCOUNTER — Ambulatory Visit: Payer: PPO | Admitting: Psychiatry

## 2018-05-07 VITALS — BP 133/78 | HR 66

## 2018-05-07 DIAGNOSIS — F411 Generalized anxiety disorder: Secondary | ICD-10-CM | POA: Diagnosis not present

## 2018-05-07 DIAGNOSIS — F3181 Bipolar II disorder: Secondary | ICD-10-CM | POA: Diagnosis not present

## 2018-05-07 MED ORDER — VENLAFAXINE HCL ER 75 MG PO CP24: 75.0000 mg | ORAL_CAPSULE | Freq: Every day | ORAL | 1 refills | Status: DC

## 2018-05-07 NOTE — Progress Notes (Signed)
[name] [id number] [birth year] 67 y.o.  Subjective:   Patient ID:  [name] is a 67 y.o. (DOB [birth year]) female.  Chief Complaint:  Chief Complaint  Patient presents with  . Anxiety  . Follow-up    Recent depression    HPI [name] presents to the office today for follow-up of mood and anxiety. She reports that she is "better." Husband has had another TIA and is in SNF again. Reports that she is saddened by husband dconitnuing to have health issues. Reports that she cries occasionally when talking about husband's health and overall decline. Denies any crying or sadness outside of this. Reports occasional irritability since running out of Effexor XR. Reports mild fluctuations in mood in response to stressors.  Reports that she has not had Effexor XR for about 2 weeks since she did not realize that another script was on file. She denies any impulsive or risky behaviors. Denies any manic s/s with Effexor XR to include sleeplessness. Reports episodic anxiety with situation with husband- wants to stay with husband at SNF, but then thinks of other things she needs to be doing. Reports that she is sleeping an adequate amount, however sleep is fragmented due to frequent urination. Reports that her appetite has been good. Reports energy has been low and motivation is low at times and she has to push herself to do certain things. She reports that her energy and motivation were "probably" better when she was taking Venlafaxine XR. Has lost interest in sewing and not sewn since husband's health issues started. 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 Concentration is adequate. Denies SI.   Past medication trials: Seroquel Seroquel XR Lamictal Trileptal Effexor XR Lexapro BuSpar-felt tired  Review of Systems:  Review of Systems  HENT:       Nasal passage dryness  Musculoskeletal: Negative for gait problem.  Neurological: Negative for tremors.   Psychiatric/Behavioral:       Please refer to HPI    Medications: I have reviewed the patient's current medications.  Current Outpatient Medications  Medication Sig Dispense Refill  . acetaminophen (TYLENOL) 500 MG tablet Take 1,000-1,500 mg by mouth every 6 (six) hours as needed (For knee pain.).    Marland Kitchen amLODipine (NORVASC) 5 MG tablet TAKE ONE TABLET BY MOUTH DAILY 90 tablet 3  . aspirin EC 81 MG tablet Take 81 mg by mouth daily.    . clonazePAM (KLONOPIN) 0.5 MG tablet Take 0.5 mg by mouth at bedtime as needed (Insomnia).     . docusate sodium (COLACE) 100 MG capsule Take 1 capsule (100 mg total) by mouth 2 (two) times daily. While taking narcotic pain medicine. 30 capsule 0  . fenofibrate 160 MG tablet Take 160 mg by mouth daily.    . fenofibrate micronized (ANTARA) 130 MG capsule Take 1 capsule (130 mg total) by mouth daily before breakfast. 30 capsule 3  . furosemide (LASIX) 40 MG tablet TAKE ONE TABLET BY MOUTH DAILY *PLEASE KEEP UPCOMING APPOINTMENT* (Patient taking differently: Take 40 mg by mouth daily. ) 30 tablet 11  . glucose blood (ONE TOUCH ULTRA TEST) test strip USE AS DIRECTED ONCE DAILY FOR BLOOD SUGAR 100 each 11  . lamoTRIgine (LAMICTAL) 100 MG tablet Take 100 mg by mouth. 1 po qam, 1 po qhs    . lamoTRIgine (LAMICTAL) 200 MG tablet Take 100-200 mg by mouth See admin instructions. Take 100 mg by mouth in the morning and 200 mg by mouth at  bedtime    . metoprolol succinate (TOPROL-XL) 25 MG 24 hr tablet Take 1 tablet (25 mg total) by mouth every morning. 90 tablet 1  . ONE TOUCH LANCETS MISC Test once daily to check blood sugar. DX E11.9 100 each 3  . OXcarbazepine (TRILEPTAL) 150 MG tablet Take 150 mg by mouth 2 (two) times daily.    . pantoprazole (PROTONIX) 40 MG tablet Take 1 tablet (40 mg total) by mouth daily. 90 tablet 3  . Polyethyl Glycol-Propyl Glycol (SYSTANE OP) Place 1 drop into both eyes at bedtime as needed (For dry eyes.).     Marland Kitchen Probiotic Product (ALIGN) 4  MG CAPS Take 4 mg by mouth daily.    . QUEtiapine (SEROQUEL XR) 300 MG 24 hr tablet Take 300 mg by mouth 2 (two) times daily.    Marland Kitchen rOPINIRole (REQUIP) 0.25 MG tablet TAKE ONE TO TWO TABLETS BY MOUTH AT BEDTIME 60 tablet 1  . rosuvastatin (CRESTOR) 40 MG tablet TAKE ONE TABLET BY MOUTH DAILY (Patient taking differently: Take 40 mg by mouth daily. ) 90 tablet 3  . senna (SENOKOT) 8.6 MG TABS tablet Take 2 tablets (17.2 mg total) by mouth 2 (two) times daily. 30 each 0  . warfarin (COUMADIN) 5 MG tablet TAKE AS DIRECTED BY COUMADIN CLINIC 90 tablet 0  . SUMAtriptan (IMITREX) 50 MG tablet TAKE ONE TABLET EVERY 2 HOURS AS NEEDED FOR MIGRAINE. MAY REPEAT IN 2 HOURS IF HEADACHE PERSISTS OR RECURS. (MAX OF 2 TABLETS IN 24 HOURS) (Patient not taking: Reported on 05/07/2018) 10 tablet 0  . venlafaxine XR (EFFEXOR XR) 75 MG 24 hr capsule Take 1 capsule (75 mg total) by mouth daily with breakfast. 90 capsule 1  . venlafaxine XR (EFFEXOR-XR) 37.5 MG 24 hr capsule Take 1 capsule po q am x 1 week, then 2 capsules po q am (Patient not taking: Reported on 05/07/2018) 30 capsule 0   No current facility-administered medications for this visit.     Medication Side Effects: None  Allergies:  Allergies  Allergen Reactions  . Mucinex [Guaifenesin Er] Other (See Comments)    Severe headaches   . Keflex [Cephalexin] Other (See Comments)    Headaches and dizziness  . Sulfonamide Derivatives Other (See Comments)    Headaches     Past Medical History:  Diagnosis Date  . ANEMIA 05/28/2010  . AORTIC VALVE REPLACEMENT, HX OF 03/11/2010   Mechanical prosthesis  . BIPOLAR AFFECTIVE DISORDER 03/11/2010  . Depression   . Diverticulitis 12/18/2010  . FIBROIDS, UTERUS 03/11/2010  . Gout 09/04/2016  . Grave's disease 8-12  . Hyperlipidemia associated with type 2 diabetes mellitus ([place]) 06/05/2016  . Hypertension   . Low back pain 11/02/2017  . Migraine 06/05/2016  . Mixed hyperlipidemia 03/11/2010  . Obesity   . UTI  (lower urinary tract infection) 08/14/2011    Family History  Problem Relation Age of Onset  . Scoliosis Mother   . Arthritis Mother        Rheumatoid  . Aneurysm Mother        heart  . Alcohol abuse Father   . Depression Sister   . Depression Brother   . Alcohol abuse Brother   . Cancer Maternal Grandmother        colon  . Diabetes Maternal Grandfather   . Heart disease Maternal Grandfather   . Alcohol abuse Maternal Grandfather   . Depression Paternal Grandmother   . Diabetes Paternal Grandmother   . Depression Paternal Grandfather   .  Diabetes Paternal Grandfather   . Depression Sister   . Alcohol abuse Brother   . Depression Brother   . Heart disease Brother     Social History   Socioeconomic History  . Marital status: Married    Spouse name: Not on file  . Number of children: Not on file  . Years of education: Not on file  . Highest education level: Not on file  Occupational History  . Not on file  Social Needs  . Financial resource strain: Not on file  . Food insecurity:    Worry: Not on file    Inability: Not on file  . Transportation needs:    Medical: Not on file    Non-medical: Not on file  Tobacco Use  . Smoking status: Former Smoker    Last attempt to quit: 05/26/1990    Years since quitting: 27.9  . Smokeless tobacco: Never Used  Substance and Sexual Activity  . Alcohol use: Yes    Alcohol/week: 0.0 standard drinks    Comment: rare  . Drug use: No  . Sexual activity: Not Currently  Lifestyle  . Physical activity:    Days per week: Not on file    Minutes per session: Not on file  . Stress: Not on file  Relationships  . Social connections:    Talks on phone: Not on file    Gets together: Not on file    Attends religious service: Not on file    Active member of club or organization: Not on file    Attends meetings of clubs or organizations: Not on file    Relationship status: Not on file  . Intimate partner violence:    Fear of current or  ex partner: Not on file    Emotionally abused: Not on file    Physically abused: Not on file    Forced sexual activity: Not on file  Other Topics Concern  . Not on file  Social History Narrative  . Not on file    Past Medical History, Surgical history, Social history, and Family history were reviewed and updated as appropriate.   Please see review of systems for further details on the patient's review from today.   Objective:   Physical Exam:  BP 133/78   Pulse 66   Physical Exam Constitutional:      General: She is not in acute distress.    Appearance: She is well-developed.  Musculoskeletal:        General: No deformity.  Neurological:     Mental Status: She is alert and oriented to person, place, and time.     Coordination: Coordination normal.  Psychiatric:        Mood and Affect: Mood is not anxious or depressed. Affect is not labile, blunt, angry or inappropriate.        Speech: Speech normal.        Behavior: Behavior normal.        Thought Content: Thought content normal. Thought content does not include homicidal or suicidal ideation. Thought content does not include homicidal or suicidal plan.        Judgment: Judgment normal.     Comments: Mood appropriate to content.  Affect congruent (ie.  Becomes tearful when discussing husband's overall physical decline) Insight intact. No auditory or visual hallucinations. No delusions.      Lab Review:     Component Value Date/Time   NA 139 03/18/2018 1202   K 4.1 03/18/2018 1202  CL 104 03/18/2018 1202   CO2 24 03/18/2018 1202   GLUCOSE 195 (H) 03/18/2018 1202   BUN 19 03/18/2018 1202   CREATININE 0.81 03/18/2018 1202   CREATININE 0.89 06/20/2015 1049   CALCIUM 9.5 03/18/2018 1202   PROT 6.8 03/18/2018 1202   ALBUMIN 4.3 03/18/2018 1202   AST 20 03/18/2018 1202   ALT 22 03/18/2018 1202   ALKPHOS 58 03/18/2018 1202   BILITOT 0.5 03/18/2018 1202   GFRNONAA 54 (L) 02/04/2018 0821   GFRAA >60 02/04/2018 0821        Component Value Date/Time   WBC 7.1 03/18/2018 1202   RBC 4.26 03/18/2018 1202   HGB 12.8 03/18/2018 1202   HGB 12.0 06/16/2011 0953   HCT 38.2 03/18/2018 1202   HCT 35.6 06/16/2011 0953   PLT 254.0 03/18/2018 1202   PLT 233 06/16/2011 0953   MCV 89.5 03/18/2018 1202   MCV 86.6 06/16/2011 0953   MCH 29.5 02/07/2018 0431   MCHC 33.6 03/18/2018 1202   RDW 14.3 03/18/2018 1202   RDW 14.1 06/16/2011 0953   LYMPHSABS 1.9 02/04/2018 0821   LYMPHSABS 1.7 06/16/2011 0953   MONOABS 1.1 (H) 02/04/2018 0821   MONOABS 0.3 06/16/2011 0953   EOSABS 0.1 02/04/2018 0821   EOSABS 0.2 06/16/2011 0953   BASOSABS 0.1 02/04/2018 0821   BASOSABS 0.1 06/16/2011 0953    No results found for: POCLITH, LITHIUM   No results found for: PHENYTOIN, PHENOBARB, VALPROATE, CBMZ   .res Assessment: Plan:   Will restart Effexor XR 75 mg p.o. every morning since patient reports that this was helpful for her mood and anxiety signs and symptoms and has noticed some recurrence in the signs and symptoms since she stopped taking it a few weeks ago. Continue Seroquel XR 300 mg 2 tabs p.o. q. evening for mood and anxiety Continue Lamictal 100 mg p.o. every morning and 200 mg p.o. nightly for mood. Continue Klonopin 0.5 mg p.o. nightly as needed anxiety. Bipolar II disorder ([place]) - Plan: venlafaxine XR (EFFEXOR XR) 75 MG 24 hr capsule  Generalized anxiety disorder - Plan: venlafaxine XR (EFFEXOR XR) 75 MG 24 hr capsule  Please see After Visit Summary for patient specific instructions.  Future Appointments  Date Time Provider [place]  05/25/2018  1:00 PM [facility]  06/21/2018 11:00 AM Mosie Lukes, MD [facility]  08/06/2018 11:00 AM Thayer Headings, PMHNP [facility] None    No orders of the defined types were placed in this encounter.     -------------------------------

## 2018-05-25 ENCOUNTER — Encounter: Payer: Self-pay | Admitting: Family Medicine

## 2018-05-25 ENCOUNTER — Ambulatory Visit (INDEPENDENT_AMBULATORY_CARE_PROVIDER_SITE_OTHER): Payer: PPO | Admitting: *Deleted

## 2018-05-25 DIAGNOSIS — I4891 Unspecified atrial fibrillation: Secondary | ICD-10-CM | POA: Diagnosis not present

## 2018-05-25 DIAGNOSIS — Z5181 Encounter for therapeutic drug level monitoring: Secondary | ICD-10-CM

## 2018-05-25 DIAGNOSIS — Z952 Presence of prosthetic heart valve: Secondary | ICD-10-CM | POA: Diagnosis not present

## 2018-05-25 LAB — POCT INR: INR: 3.2 — AB (ref 2.0–3.0)

## 2018-05-25 NOTE — Patient Instructions (Signed)
Description   Continue on same dosage 1/2 tablet daily except 1 tablet on Tuesdays, Thursdays, and Saturdays.  Recheck INR in 4 weeks.

## 2018-06-19 ENCOUNTER — Other Ambulatory Visit: Payer: Self-pay | Admitting: Interventional Cardiology

## 2018-06-21 ENCOUNTER — Ambulatory Visit: Payer: PPO | Admitting: Family Medicine

## 2018-06-22 ENCOUNTER — Ambulatory Visit (INDEPENDENT_AMBULATORY_CARE_PROVIDER_SITE_OTHER): Payer: PPO | Admitting: *Deleted

## 2018-06-22 DIAGNOSIS — Z5181 Encounter for therapeutic drug level monitoring: Secondary | ICD-10-CM | POA: Diagnosis not present

## 2018-06-22 DIAGNOSIS — I4891 Unspecified atrial fibrillation: Secondary | ICD-10-CM

## 2018-06-22 DIAGNOSIS — Z952 Presence of prosthetic heart valve: Secondary | ICD-10-CM | POA: Diagnosis not present

## 2018-06-22 LAB — POCT INR: INR: 2.8 (ref 2.0–3.0)

## 2018-06-22 NOTE — Patient Instructions (Signed)
Description   Continue on same dosage 1/2 tablet daily except 1 tablet on Tuesdays, Thursdays, and Saturdays. Recheck INR in 4 weeks.

## 2018-07-20 ENCOUNTER — Ambulatory Visit (INDEPENDENT_AMBULATORY_CARE_PROVIDER_SITE_OTHER): Payer: PPO | Admitting: Pharmacist

## 2018-07-20 DIAGNOSIS — I4891 Unspecified atrial fibrillation: Secondary | ICD-10-CM | POA: Diagnosis not present

## 2018-07-20 DIAGNOSIS — Z5181 Encounter for therapeutic drug level monitoring: Secondary | ICD-10-CM

## 2018-07-20 DIAGNOSIS — Z952 Presence of prosthetic heart valve: Secondary | ICD-10-CM | POA: Diagnosis not present

## 2018-07-20 LAB — POCT INR: INR: 3.8 — AB (ref 2.0–3.0)

## 2018-07-20 NOTE — Patient Instructions (Signed)
Description   Take 1/2 tablet today, then continue on same dosage 1/2 tablet daily except 1 tablet on Tuesdays, Thursdays, and Saturdays. Recheck INR in 3 weeks.

## 2018-07-22 ENCOUNTER — Telehealth: Payer: Self-pay | Admitting: Family Medicine

## 2018-07-22 ENCOUNTER — Ambulatory Visit: Payer: PPO | Admitting: Family Medicine

## 2018-07-22 ENCOUNTER — Other Ambulatory Visit: Payer: Self-pay | Admitting: Family Medicine

## 2018-07-22 DIAGNOSIS — E113593 Type 2 diabetes mellitus with proliferative diabetic retinopathy without macular edema, bilateral: Secondary | ICD-10-CM

## 2018-07-22 NOTE — Telephone Encounter (Signed)
Patient came in 07/22/18 pcp was about 45 minutes behind patient rescheduled and wants a referral to Endocrinology  Please advise

## 2018-07-22 NOTE — Telephone Encounter (Signed)
I have placed referral

## 2018-07-22 NOTE — Telephone Encounter (Signed)
Pt came in office stating is needing a referral for an endocrinologist Dr Algernon Huxley Johna Roles) from [facility]- Tel [phone]. Pt would like to have it done ASAP Pt tel [phone].

## 2018-07-26 ENCOUNTER — Other Ambulatory Visit: Payer: Self-pay | Admitting: Psychiatry

## 2018-07-27 ENCOUNTER — Ambulatory Visit: Payer: PPO | Admitting: Podiatry

## 2018-08-03 ENCOUNTER — Ambulatory Visit: Payer: PPO | Admitting: Family Medicine

## 2018-08-04 ENCOUNTER — Telehealth: Payer: Self-pay | Admitting: Family Medicine

## 2018-08-04 NOTE — Telephone Encounter (Signed)
Patient declined to schedule AWV. SF 

## 2018-08-06 ENCOUNTER — Ambulatory Visit: Payer: PPO | Admitting: Psychiatry

## 2018-08-13 ENCOUNTER — Other Ambulatory Visit: Payer: Self-pay

## 2018-08-16 ENCOUNTER — Ambulatory Visit: Payer: PPO | Admitting: Internal Medicine

## 2018-08-16 ENCOUNTER — Other Ambulatory Visit: Payer: Self-pay

## 2018-08-16 ENCOUNTER — Encounter: Payer: Self-pay | Admitting: Internal Medicine

## 2018-08-16 VITALS — BP 154/68 | HR 66 | Temp 97.9°F | Ht 68.0 in | Wt 251.4 lb

## 2018-08-16 DIAGNOSIS — E1142 Type 2 diabetes mellitus with diabetic polyneuropathy: Secondary | ICD-10-CM | POA: Diagnosis not present

## 2018-08-16 DIAGNOSIS — E1165 Type 2 diabetes mellitus with hyperglycemia: Secondary | ICD-10-CM

## 2018-08-16 LAB — BASIC METABOLIC PANEL
BUN: 17 mg/dL (ref 6–23)
CO2: 27 mEq/L (ref 19–32)
Calcium: 9.2 mg/dL (ref 8.4–10.5)
Chloride: 95 mEq/L — ABNORMAL LOW (ref 96–112)
Creatinine, Ser: 0.97 mg/dL (ref 0.40–1.20)
GFR: 57.16 mL/min — ABNORMAL LOW (ref >60.00)
Glucose, Bld: 397 mg/dL — ABNORMAL HIGH (ref 70–99)
Potassium: 4.3 mEq/L (ref 3.5–5.1)
Sodium: 133 mEq/L — ABNORMAL LOW (ref 135–145)

## 2018-08-16 LAB — MICROALBUMIN / CREATININE URINE RATIO
Creatinine,U: 31.3 mg/dL
Microalb Creat Ratio: 7.3 mg/g (ref 0.0–30.0)
Microalb, Ur: 2.3 mg/dL — ABNORMAL HIGH (ref 0.0–1.9)

## 2018-08-16 LAB — POCT GLYCOSYLATED HEMOGLOBIN (HGB A1C): Hemoglobin A1C: 8.7 % — AB (ref 4.0–5.6)

## 2018-08-16 MED ORDER — METFORMIN HCL ER 500 MG PO TB24: 1000.0000 mg | ORAL_TABLET | Freq: Two times a day (BID) | ORAL | 6 refills | Status: DC

## 2018-08-16 NOTE — Progress Notes (Signed)
Name: [name]  MRN/ DOB: [id number], 02/07/1951   Age/ Sex: 68 y.o., female    PCP: Mosie Lukes, MD   Reason for Endocrinology Evaluation: Type 2 Diabetes Mellitus     Date of Initial Endocrinology Visit: 08/16/2018     PATIENT IDENTIFIER: [name] is a 68 y.o. female with a past medical history of T2DM, Bipolar disorder and dyslipidemia. The patient presented for initial endocrinology clinic visit on 08/16/2018 for consultative assistance with her diabetes management.    HPI: Ms. Will was    Diagnosed with T2DM 2018 Prior Medications tried/Intolerance: No  Currently checking blood sugars 1 x / day,  before breakfast. Hypoglycemia episodes : no Hemoglobin A1c has ranged from 6.9% in 2018, peaking at 7.4 % in 2019. Patient required assistance for hypoglycemia: no Patient has required hospitalization within the last 1 year from hyper or hypoglycemia: no  In terms of diet, the patient drinks juice, and diet sodas. Eats 3 meals and snacks at times .    HOME DIABETES REGIMEN: No   Statin: yes ACE-I/ARB: no Prior Diabetic Education: no   METER DOWNLOAD SUMMARY: Date range evaluated: 2/23-3/23/2020 Fingerstick Blood Glucose Tests = 21 Overall Mean FS Glucose = 223 Standard Deviation = 40.7  BG Ranges: Low = 160 High = 328   Hypoglycemic Events/30 Days: BG < 50 = 0 Episodes of symptomatic severe hypoglycemia = 0   DIABETIC COMPLICATIONS: Microvascular complications:    Denies: retinopathy, neuropathy , CKD   Last eye exam: Completed  2019  Macrovascular complications:   S/P aortic valve replacement.   Denies: CAD, PVD, CVA   PAST HISTORY: Past Medical History:  Past Medical History:  Diagnosis Date  . ANEMIA 05/28/2010  . AORTIC VALVE REPLACEMENT, HX OF 03/11/2010   Mechanical prosthesis  . BIPOLAR AFFECTIVE DISORDER 03/11/2010  . Depression   . Diverticulitis 12/18/2010  . FIBROIDS, UTERUS 03/11/2010  . Gout 09/04/2016  . Grave's  disease 8-12  . Hyperlipidemia associated with type 2 diabetes mellitus ([place]) 06/05/2016  . Hypertension   . Low back pain 11/02/2017  . Migraine 06/05/2016  . Mixed hyperlipidemia 03/11/2010  . Obesity   . UTI (lower urinary tract infection) 08/14/2011   Past Surgical History:  Past Surgical History:  Procedure Laterality Date  . ABDOMINAL HYSTERECTOMY  ?1998   sb/l spo, total for fibroids/heavy bleeding  . ANKLE SURGERY Left 01/2018   hardware removal  . AORTIC VALVE REPLACEMENT  2011   Mechanical prosthesis, [employer]  . APPENDECTOMY     Required revision for EColi infection, required recurrent packing  . bowel obstruction     Requiring adhesions to be removed  . CHOLECYSTECTOMY    . COLONOSCOPY WITH PROPOFOL N/A 11/01/2015   Procedure: COLONOSCOPY WITH PROPOFOL;  Surgeon: Milus Banister, MD;  Location: [facility];  Service: Endoscopy;  Laterality: N/A;  . CYSTOCELE REPAIR N/A 10/16/2015   Procedure: ANTERIOR REPAIR (CYSTOCELE);  Surgeon: Donnamae Jude, MD;  Location: [facility];  Service: Gynecology;  Laterality: N/A;  . HERNIA REPAIR  08-16-10  . LEFT HEART CATHETERIZATION WITH CORONARY ANGIOGRAM N/A 12/05/2011   Procedure: LEFT HEART CATHETERIZATION WITH CORONARY ANGIOGRAM;  Surgeon: Sinclair Grooms, MD;  Location: [facility];  Service: Cardiovascular;  Laterality: N/A;  . TOTAL KNEE ARTHROPLASTY Right 02/24/2013   Procedure: RIGHT TOTAL KNEE ARTHROPLASTY;  Surgeon: Johnn Hai, MD;  Location: [facility];  Service: Orthopedics;  Laterality: Right;  . TUBAL LIGATION    .  varicose vein surgery b/l legs        Social History:  reports that she quit smoking about 28 years ago. She has never used smokeless tobacco. She reports current alcohol use. She reports that she does not use drugs. Family History:  Family History  Problem Relation Age of Onset  . Scoliosis Mother   . Arthritis Mother        Rheumatoid  . Aneurysm Mother        heart  . Alcohol abuse Father   . Depression  Sister   . Depression Brother   . Alcohol abuse Brother   . Cancer Maternal Grandmother        colon  . Diabetes Maternal Grandfather   . Heart disease Maternal Grandfather   . Alcohol abuse Maternal Grandfather   . Depression Paternal Grandmother   . Diabetes Paternal Grandmother   . Depression Paternal Grandfather   . Diabetes Paternal Grandfather   . Depression Sister   . Alcohol abuse Brother   . Depression Brother   . Heart disease Brother      HOME MEDICATIONS: Allergies as of 08/16/2018      Reactions   Mucinex [guaifenesin Er] Other (See Comments)   Severe headaches   Keflex [cephalexin] Other (See Comments)   Headaches and dizziness   Sulfonamide Derivatives Other (See Comments)   Headaches      Medication List       Accurate as of August 16, 2018  2:25 PM. Always use your most recent med list.        acetaminophen 500 MG tablet Commonly known as:  TYLENOL Take 1,000-1,500 mg by mouth every 6 (six) hours as needed (For knee pain.).   Align 4 MG Caps Take 4 mg by mouth daily.   amLODipine 5 MG tablet Commonly known as:  NORVASC TAKE ONE TABLET BY MOUTH DAILY   aspirin EC 81 MG tablet Take 81 mg by mouth daily.   clonazePAM 0.5 MG tablet Commonly known as:  KLONOPIN TAKE ONE TABLET BY MOUTH EVERY NIGHT AT BEDTIME   docusate sodium 100 MG capsule Commonly known as:  Colace Take 1 capsule (100 mg total) by mouth 2 (two) times daily. While taking narcotic pain medicine.   fenofibrate 160 MG tablet Take 160 mg by mouth daily.   fenofibrate micronized 130 MG capsule Commonly known as:  ANTARA Take 1 capsule (130 mg total) by mouth daily before breakfast.   furosemide 40 MG tablet Commonly known as:  LASIX TAKE ONE TABLET BY MOUTH DAILY *PLEASE KEEP UPCOMING APPOINTMENT*   glucose blood test strip Commonly known as:  ONE TOUCH ULTRA TEST USE AS DIRECTED ONCE DAILY FOR BLOOD SUGAR   LaMICtal 200 MG tablet Generic drug:  lamoTRIgine Take  100-200 mg by mouth See admin instructions. Take 100 mg by mouth in the morning and 200 mg by mouth at bedtime   metFORMIN 500 MG 24 hr tablet Commonly known as:  Glucophage XR Take 2 tablets (1,000 mg total) by mouth 2 (two) times daily with a meal.   metoprolol succinate 25 MG 24 hr tablet Commonly known as:  TOPROL-XL Take 1 tablet (25 mg total) by mouth every morning.   ONE TOUCH LANCETS Misc Test once daily to check blood sugar. DX E11.9   OXcarbazepine 150 MG tablet Commonly known as:  TRILEPTAL Take 150 mg by mouth 2 (two) times daily.   pantoprazole 40 MG tablet Commonly known as:  PROTONIX Take 1 tablet (40  mg total) by mouth daily.   QUEtiapine 300 MG 24 hr tablet Commonly known as:  SEROQUEL XR Take 300 mg by mouth 2 (two) times daily.   rOPINIRole 0.25 MG tablet Commonly known as:  REQUIP TAKE ONE TO TWO TABLETS BY MOUTH AT BEDTIME   rosuvastatin 40 MG tablet Commonly known as:  CRESTOR TAKE ONE TABLET BY MOUTH DAILY   senna 8.6 MG Tabs tablet Commonly known as:  SENOKOT Take 2 tablets (17.2 mg total) by mouth 2 (two) times daily.   SUMAtriptan 50 MG tablet Commonly known as:  IMITREX TAKE ONE TABLET EVERY 2 HOURS AS NEEDED FOR MIGRAINE. MAY REPEAT IN 2 HOURS IF HEADACHE PERSISTS OR RECURS. (MAX OF 2 TABLETS IN 24 HOURS)   SYSTANE OP Place 1 drop into both eyes at bedtime as needed (For dry eyes.).   venlafaxine XR 37.5 MG 24 hr capsule Commonly known as:  EFFEXOR-XR Take 1 capsule po q am x 1 week, then 2 capsules po q am   venlafaxine XR 75 MG 24 hr capsule Commonly known as:  Effexor XR Take 1 capsule (75 mg total) by mouth daily with breakfast.   warfarin 5 MG tablet Commonly known as:  COUMADIN Take as directed by the anticoagulation clinic. If you are unsure how to take this medication, talk to your nurse or doctor. Original instructions:  TAKE AS DIRECTED BY COUMADIN CLINIC        ALLERGIES: Allergies  Allergen Reactions  . Mucinex  [Guaifenesin Er] Other (See Comments)    Severe headaches   . Keflex [Cephalexin] Other (See Comments)    Headaches and dizziness  . Sulfonamide Derivatives Other (See Comments)    Headaches      REVIEW OF SYSTEMS: A comprehensive ROS was conducted with the patient and is negative except as per HPI and below:  Review of Systems  Constitutional: Negative for chills and fever.  HENT: Negative for congestion and sore throat.   Eyes: Negative for blurred vision and pain.  Respiratory: Negative for cough and shortness of breath.   Cardiovascular: Negative for chest pain and palpitations.  Gastrointestinal: Negative for diarrhea and nausea.  Genitourinary: Positive for frequency.  Neurological: Positive for tingling. Negative for tremors.  Endo/Heme/Allergies: Positive for polydipsia.  Psychiatric/Behavioral: Positive for depression. The patient is not nervous/anxious.       OBJECTIVE:   VITAL SIGNS: BP (!) 154/68 (BP Location: Left Arm, Patient Position: Sitting, Cuff Size: Large)   Pulse 66   Temp 97.9 F (36.6 C)   Ht 5\' 8"  (1.727 m)   Wt 251 lb 6.4 oz (114 kg)   SpO2 96%   BMI 38.23 kg/m    PHYSICAL EXAM:  General: Pt appears well and is in NAD  Hydration: Well-hydrated with moist mucous membranes and good skin turgor  HEENT: Head: Unremarkable with good dentition. Oropharynx clear without exudate.  Eyes: External eye exam normal without stare, lid lag or exophthalmos.  EOM intact.    Neck: General: Supple without adenopathy or carotid bruits. Thyroid: Thyroid size normal.  No goiter or nodules appreciated. No thyroid bruit.  Lungs: Clear with good BS bilat with no rales, rhonchi, or wheezes  Heart: RRR with normal S1 and S2 and no gallops; no murmurs; no rub  Abdomen: Normoactive bowel sounds, soft, nontender, without masses or organomegaly palpable  Extremities:  Lower extremities - No pretibial edema. No lesions.  Skin: Normal texture and temperature to palpation.  No rash noted. No Acanthosis nigricans/skin tags.  No lipohypertrophy.  Neuro: MS is good with appropriate affect, pt is alert and Ox3    DM foot exam: 08/16/2018  The skin of the feet is intact without sores or ulcerations. The pedal pulses are 2+ on right and 2+ on left. The sensation is absent at the right great and second toe to a screening 5.07, 10 gram monofilament , normal on the left .   DATA REVIEWED:  Lab Results  Component Value Date   HGBA1C 8.7 (A) 08/16/2018   HGBA1C 6.4 03/18/2018   HGBA1C 7.4 (H) 11/02/2017   Lab Results  Component Value Date   LDLCALC 71 04/04/2013   CREATININE 0.81 03/18/2018    Lab Results  Component Value Date   CHOL 185 03/18/2018   HDL 44.30 03/18/2018   LDLCALC 71 04/04/2013   LDLDIRECT 79.0 03/18/2018   TRIG (H) 03/18/2018    553.0 Triglyceride is over 400; calculations on Lipids are invalid.   CHOLHDL 4 03/18/2018        A1c 8.7 %  Results for VANESHA, ATHENS (MRN [id number]) as of 08/17/2018 08:08  Ref. Range 08/16/2018 14:16  Sodium Latest Ref Range: 135 - 145 mEq/L 133 (L)  Potassium Latest Ref Range: 3.5 - 5.1 mEq/L 4.3  Chloride Latest Ref Range: 96 - 112 mEq/L 95 (L)  CO2 Latest Ref Range: 19 - 32 mEq/L 27  Glucose Latest Ref Range: 70 - 99 mg/dL 397 (H)  BUN Latest Ref Range: 6 - 23 mg/dL 17  Creatinine Latest Ref Range: 0.40 - 1.20 mg/dL 0.97  Calcium Latest Ref Range: 8.4 - 10.5 mg/dL 9.2  GFR Latest Ref Range: >60.00 mL/min 57.16 (L)  MICROALB/CREAT RATIO Latest Ref Range: 0.0 - 30.0 mg/g 7.3  Results for LORRAIN, RIVERS (MRN [id number]) as of 08/17/2018 08:08  Ref. Range 08/16/2018 14:16  Creatinine,U Latest Units: mg/dL 31.3  Microalb, Ur Latest Ref Range: 0.0 - 1.9 mg/dL 2.3 (H)  MICROALB/CREAT RATIO Latest Ref Range: 0.0 - 30.0 mg/g 7.3   ASSESSMENT / PLAN / RECOMMENDATIONS:   1) Type 2 Diabetes Mellitus, Poorly controlled, Withneuropathic complications - Most recent A1c of 8.7 %. Goal A1c < 7.0 %.    Plan:  GENERAL: I have discussed with the patient the pathophysiology of diabetes. We went over the natural progression of the disease. We talked about both insulin resistance and insulin deficiency. We stressed the importance of lifestyle changes including diet and exercise. I explained the complications associated with diabetes including retinopathy, nephropathy, neuropathy as well as increased risk of cardiovascular disease. We went over the benefit seen with glycemic control.    I explained to the patient that diabetic patients are at higher than normal risk for amputations. The patient was informed that diabetes is the number one cause of non-traumatic amputations in [place].   Pt advised to avoid sugar sweetened beverages and to avoid snacks, we discussed low CHO snacks.   Will refer her to our CDE for detailed discussion about low CHO diet.    MEDICATIONS:  Metformin 500 mg XR two tablets bID  EDUCATION / INSTRUCTIONS:  BG monitoring instructions: Patient is instructed to check her blood sugars 2 times a day, fasting and bedtime.  Call [facility] if: BG persistently < 70 or > 300.   2) Diabetic complications:   Eye: Does not have known diabetic retinopathy.   Neuro/ Feet: Does have known diabetic peripheral neuropathy, based on exam 08/16/2018  Renal: Patient does not have known baseline CKD. She  is not on an ACEI/ARB at present. Normal  Micro albumin/cr ratio  testing on today's labs. Today.   3) Dyslipidemia/Hypertriglyceridemia: Patient is  on a statin and fenofibrate, on her medlist she has 2 different doses of fenofibrate, we discussed the importance of making sure her med list is updates and she is taking prescribed meds, we discussed high risk of pancreatitis with high TG.    4) Hypertension: she is not  at goal of < 140/90 mmHg.     F/u 6 weeks   Signed electronically by: Mack Guise, MD  [facility]  [facility]  [address] [place], [place] [address] Phone: [phone] FAX: [phone]   CC: Mosie Lukes, [address] [place] [address] Phone: [phone]  Fax: [phone]    Return to Endocrinology clinic as below: Future Appointments  Date Time Provider [place]  08/18/2018  2:00 PM Thayer Headings, PMHNP [facility] None  08/19/2018 10:45 AM Mosie Lukes, MD [facility]  08/31/2018  3:00 PM Trula Slade, DPM [facility]  09/27/2018  1:00 PM Shamleffer, Melanie Crazier, MD [facility] None

## 2018-08-16 NOTE — Patient Instructions (Signed)
Start Metformin Start 1 pill with Breakfast x 1 week, then increase to 1 pill with Breakfast and 1 pill with Supper x 1 week, then increase to 2 pills in with Breakfast  and 1 pill with supper , finally 2 pills with Breakfast and 2 with supper.    Check sugar twice a day (fasting and bedtime)   Choose healthy, lower carb lower calorie snacks: toss salad, cooked vegetables, cottage cheese, peanut butter, low fat cheese / string cheese, lower sodium deli meat, tuna salad or chicken salad

## 2018-08-18 ENCOUNTER — Telehealth: Payer: Self-pay | Admitting: Internal Medicine

## 2018-08-18 ENCOUNTER — Other Ambulatory Visit: Payer: Self-pay

## 2018-08-18 ENCOUNTER — Ambulatory Visit (INDEPENDENT_AMBULATORY_CARE_PROVIDER_SITE_OTHER): Payer: PPO | Admitting: Psychiatry

## 2018-08-18 DIAGNOSIS — F3181 Bipolar II disorder: Secondary | ICD-10-CM | POA: Diagnosis not present

## 2018-08-18 DIAGNOSIS — F5105 Insomnia due to other mental disorder: Secondary | ICD-10-CM

## 2018-08-18 DIAGNOSIS — F411 Generalized anxiety disorder: Secondary | ICD-10-CM | POA: Diagnosis not present

## 2018-08-18 DIAGNOSIS — F99 Mental disorder, not otherwise specified: Secondary | ICD-10-CM

## 2018-08-18 MED ORDER — LAMOTRIGINE 100 MG PO TABS: ORAL_TABLET | ORAL | 0 refills | Status: DC

## 2018-08-18 MED ORDER — CLONAZEPAM 0.5 MG PO TABS: 0.5000 mg | ORAL_TABLET | Freq: Every day | ORAL | 2 refills | Status: DC

## 2018-08-18 MED ORDER — QUETIAPINE FUMARATE ER 300 MG PO TB24: 600.0000 mg | ORAL_TABLET | Freq: Every evening | ORAL | 0 refills | Status: DC

## 2018-08-18 MED ORDER — GLUCOSE BLOOD VI STRP: ORAL_STRIP | 11 refills | Status: DC

## 2018-08-18 MED ORDER — OXCARBAZEPINE 150 MG PO TABS: 150.0000 mg | ORAL_TABLET | Freq: Two times a day (BID) | ORAL | 0 refills | Status: DC

## 2018-08-18 MED ORDER — ONETOUCH ULTRASOFT LANCETS MISC: 12 refills | Status: DC

## 2018-08-18 NOTE — Progress Notes (Signed)
[name] [id number] 03/30/1951 68 y.o.  Subjective:   Patient ID:  [name] is a 68 y.o. (DOB 1950-10-18) female.  Virtual Visit via Telephone Note  I connected with pt by telephone and verified that I am speaking with the correct person using two identifiers.   I discussed the limitations, risks, security and privacy concerns of performing an evaluation and management service by telephone and the availability of in person appointments. I also discussed with the patient that there may be a patient responsible charge related to this service. The patient expressed understanding and agreed to proceed.  I discussed the assessment and treatment plan with the patient. The patient was provided an opportunity to ask questions and all were answered. The patient agreed with the plan and demonstrated an understanding of the instructions.   The patient was advised to call back or seek an in-person evaluation if the symptoms worsen or if the condition fails to improve as anticipated.  I provided 25 minutes of non-face-to-face time during this encounter. The call started at 2 pm and ended at 2:30 pm. The patient was located in her personal vehicle and the provider was located at [place].   Chief Complaint:  Chief Complaint  Patient presents with  . Sleeping Problem  . Follow-up    h/o anxiety and mood instability    HPI [name] presents to the office today for follow-up of mood and anxiety. "I've been doing really good." She reports that she is not sleeping very well. She describes fragmented sleep and has difficulty falling and staying asleep. Reports that she is sometimes unable to fall asleep until 2 am. She reports that her mood has been stable and denies depressed or irritable mood. Denies any significant anxiety. Reports that her appetite has been "too good." She reports that her energy is "terrible. I just want to be left alone and do nothing." Reports that she has  not felt like doing some tasks, such as washing the sheets on her bed. She reports that her concentration has been ok. Denies SI.   Denies impulsive or risky behavior.   Past medication trials: Seroquel Seroquel XR Lamictal Trileptal Effexor XR Lexapro BuSpar-felt tired Trazodone  Review of Systems:  Review of Systems  Constitutional: Positive for fatigue.  Endocrine:       Recently dx'd with DM. Starting medication for DM and picking it up today.   Musculoskeletal:       Reports that she has a "lump" on the bottom of her left foot and has apt to see ankle specialist this week.     Medications: I have reviewed the patient's current medications.  Current Outpatient Medications  Medication Sig Dispense Refill  . acetaminophen (TYLENOL) 500 MG tablet Take 1,000-1,500 mg by mouth every 6 (six) hours as needed (For knee pain.).    Marland Kitchen amLODipine (NORVASC) 5 MG tablet TAKE ONE TABLET BY MOUTH DAILY 90 tablet 3  . aspirin EC 81 MG tablet Take 81 mg by mouth daily.    . clonazePAM (KLONOPIN) 0.5 MG tablet Take 1 tablet (0.5 mg total) by mouth at bedtime for 30 days. 30 tablet 2  . fenofibrate 160 MG tablet Take 160 mg by mouth daily.    . fenofibrate micronized (ANTARA) 130 MG capsule Take 1 capsule (130 mg total) by mouth daily before breakfast. 30 capsule 3  . furosemide (LASIX) 40 MG tablet TAKE ONE TABLET BY MOUTH DAILY *PLEASE KEEP UPCOMING APPOINTMENT* (Patient taking differently:  Take 40 mg by mouth daily. ) 30 tablet 11  . lamoTRIgine (LAMICTAL) 100 MG tablet Take 100 mg by mouth in the morning and 200 mg by mouth at bedtime 270 tablet 0  . metFORMIN (GLUCOPHAGE XR) 500 MG 24 hr tablet Take 2 tablets (1,000 mg total) by mouth 2 (two) times daily with a meal. 120 tablet 6  . metoprolol succinate (TOPROL-XL) 25 MG 24 hr tablet Take 1 tablet (25 mg total) by mouth every morning. 90 tablet 1  . OXcarbazepine (TRILEPTAL) 150 MG tablet Take 1 tablet (150 mg total) by mouth 2 (two) times  daily. 180 tablet 0  . pantoprazole (PROTONIX) 40 MG tablet Take 1 tablet (40 mg total) by mouth daily. 90 tablet 3  . Polyethyl Glycol-Propyl Glycol (SYSTANE OP) Place 1 drop into both eyes at bedtime as needed (For dry eyes.).     Marland Kitchen Probiotic Product (ALIGN) 4 MG CAPS Take 4 mg by mouth daily.    . QUEtiapine (SEROQUEL XR) 300 MG 24 hr tablet Take 2 tablets (600 mg total) by mouth every evening. 180 tablet 0  . rOPINIRole (REQUIP) 0.25 MG tablet TAKE ONE TO TWO TABLETS BY MOUTH AT BEDTIME 60 tablet 1  . rosuvastatin (CRESTOR) 40 MG tablet TAKE ONE TABLET BY MOUTH DAILY (Patient taking differently: Take 40 mg by mouth daily. ) 90 tablet 3  . senna (SENOKOT) 8.6 MG TABS tablet Take 2 tablets (17.2 mg total) by mouth 2 (two) times daily. 30 each 0  . SUMAtriptan (IMITREX) 50 MG tablet TAKE ONE TABLET EVERY 2 HOURS AS NEEDED FOR MIGRAINE. MAY REPEAT IN 2 HOURS IF HEADACHE PERSISTS OR RECURS. (MAX OF 2 TABLETS IN 24 HOURS) 10 tablet 0  . warfarin (COUMADIN) 5 MG tablet TAKE AS DIRECTED BY COUMADIN CLINIC 90 tablet 1  . docusate sodium (COLACE) 100 MG capsule Take 1 capsule (100 mg total) by mouth 2 (two) times daily. While taking narcotic pain medicine. (Patient not taking: Reported on 08/18/2018) 30 capsule 0  . glucose blood (ONE TOUCH ULTRA TEST) test strip USE AS DIRECTED ONCE DAILY FOR BLOOD SUGAR DX E11.65 100 each 11  . Lancets (ONETOUCH ULTRASOFT) lancets Use as instructed to test blood sugars once daily Dx. E11.65 100 each 12  . ONE TOUCH LANCETS MISC Test once daily to check blood sugar. DX E11.9 100 each 3  . venlafaxine XR (EFFEXOR XR) 75 MG 24 hr capsule Take 1 capsule (75 mg total) by mouth daily with breakfast. 90 capsule 1   No current facility-administered medications for this visit.     Medication Side Effects: None  Allergies:  Allergies  Allergen Reactions  . Mucinex [Guaifenesin Er] Other (See Comments)    Severe headaches   . Keflex [Cephalexin] Other (See Comments)     Headaches and dizziness  . Sulfonamide Derivatives Other (See Comments)    Headaches     Past Medical History:  Diagnosis Date  . ANEMIA 05/28/2010  . AORTIC VALVE REPLACEMENT, HX OF 03/11/2010   Mechanical prosthesis  . BIPOLAR AFFECTIVE DISORDER 03/11/2010  . Depression   . Diverticulitis 12/18/2010  . FIBROIDS, UTERUS 03/11/2010  . Gout 09/04/2016  . Grave's disease 8-12  . Hyperlipidemia associated with type 2 diabetes mellitus ([place]) 06/05/2016  . Hypertension   . Low back pain 11/02/2017  . Migraine 06/05/2016  . Mixed hyperlipidemia 03/11/2010  . Obesity   . UTI (lower urinary tract infection) 08/14/2011    Family History  Problem Relation Age of  Onset  . Scoliosis Mother   . Arthritis Mother        Rheumatoid  . Aneurysm Mother        heart  . Alcohol abuse Father   . Depression Sister   . Depression Brother   . Alcohol abuse Brother   . Cancer Maternal Grandmother        colon  . Diabetes Maternal Grandfather   . Heart disease Maternal Grandfather   . Alcohol abuse Maternal Grandfather   . Depression Paternal Grandmother   . Diabetes Paternal Grandmother   . Depression Paternal Grandfather   . Diabetes Paternal Grandfather   . Depression Sister   . Alcohol abuse Brother   . Depression Brother   . Heart disease Brother     Social History   Socioeconomic History  . Marital status: Married    Spouse name: Not on file  . Number of children: Not on file  . Years of education: Not on file  . Highest education level: Not on file  Occupational History  . Not on file  Social Needs  . Financial resource strain: Not on file  . Food insecurity:    Worry: Not on file    Inability: Not on file  . Transportation needs:    Medical: Not on file    Non-medical: Not on file  Tobacco Use  . Smoking status: Former Smoker    Last attempt to quit: 05/26/1990    Years since quitting: 28.2  . Smokeless tobacco: Never Used  Substance and Sexual Activity  . Alcohol use:  Yes    Alcohol/week: 0.0 standard drinks    Comment: rare  . Drug use: No  . Sexual activity: Not Currently  Lifestyle  . Physical activity:    Days per week: Not on file    Minutes per session: Not on file  . Stress: Not on file  Relationships  . Social connections:    Talks on phone: Not on file    Gets together: Not on file    Attends religious service: Not on file    Active member of club or organization: Not on file    Attends meetings of clubs or organizations: Not on file    Relationship status: Not on file  . Intimate partner violence:    Fear of current or ex partner: Not on file    Emotionally abused: Not on file    Physically abused: Not on file    Forced sexual activity: Not on file  Other Topics Concern  . Not on file  Social History Narrative  . Not on file    Past Medical History, Surgical history, Social history, and Family history were reviewed and updated as appropriate.   Please see review of systems for further details on the patient's review from today.   Objective:   Physical Exam:  There were no vitals taken for this visit.  Physical Exam  Lab Review:     Component Value Date/Time   NA 133 (L) 08/16/2018 1416   K 4.3 08/16/2018 1416   CL 95 (L) 08/16/2018 1416   CO2 27 08/16/2018 1416   GLUCOSE 397 (H) 08/16/2018 1416   BUN 17 08/16/2018 1416   CREATININE 0.97 08/16/2018 1416   CREATININE 0.89 06/20/2015 1049   CALCIUM 9.2 08/16/2018 1416   PROT 6.8 03/18/2018 1202   ALBUMIN 4.3 03/18/2018 1202   AST 20 03/18/2018 1202   ALT 22 03/18/2018 1202   ALKPHOS 58 03/18/2018  1202   BILITOT 0.5 03/18/2018 1202   GFRNONAA 54 (L) 02/04/2018 0821   GFRAA >60 02/04/2018 0821       Component Value Date/Time   WBC 7.1 03/18/2018 1202   RBC 4.26 03/18/2018 1202   HGB 12.8 03/18/2018 1202   HGB 12.0 06/16/2011 0953   HCT 38.2 03/18/2018 1202   HCT 35.6 06/16/2011 0953   PLT 254.0 03/18/2018 1202   PLT 233 06/16/2011 0953   MCV 89.5  03/18/2018 1202   MCV 86.6 06/16/2011 0953   MCH 29.5 02/07/2018 0431   MCHC 33.6 03/18/2018 1202   RDW 14.3 03/18/2018 1202   RDW 14.1 06/16/2011 0953   LYMPHSABS 1.9 02/04/2018 0821   LYMPHSABS 1.7 06/16/2011 0953   MONOABS 1.1 (H) 02/04/2018 0821   MONOABS 0.3 06/16/2011 0953   EOSABS 0.1 02/04/2018 0821   EOSABS 0.2 06/16/2011 0953   BASOSABS 0.1 02/04/2018 0821   BASOSABS 0.1 06/16/2011 0953    No results found for: POCLITH, LITHIUM   No results found for: PHENYTOIN, PHENOBARB, VALPROATE, CBMZ   .res Assessment: Plan:   Pt reports that she would like to continue current medications at this time since she reports that her mood and anxiety s/s are well controlled at this time. Discussed new diagnosis of diabetes and reviewed that Seroquel XR can cause metabolic side effects. Discussed considering dose reduction in the future to minimize risk of metabolic side effects or considering switching to medication with less risk of metabolic side effects. Pt reports that she would prefer to make dietary changes and start Metformin first and then consider changes in Seroquel XR in the future if needed.  Continue Trileptal 150 mg po BID for mood and anxiety. Continue Effexor XR 75 mg po qd for depression and anxiety. Continue Seroquel XR 300 mg 2 tabs p.o. q. evening for mood and anxiety Continue Lamictal 100 mg p.o. every morning and 200 mg p.o. nightly for mood. Continue Klonopin 0.5 mg p.o. nightly as needed anxiety. Bipolar II disorder ([place]) - Plan: QUEtiapine (SEROQUEL XR) 300 MG 24 hr tablet, lamoTRIgine (LAMICTAL) 100 MG tablet, OXcarbazepine (TRILEPTAL) 150 MG tablet  Generalized anxiety disorder - Plan: clonazePAM (KLONOPIN) 0.5 MG tablet, OXcarbazepine (TRILEPTAL) 150 MG tablet  Insomnia due to other mental disorder - Plan: clonazePAM (KLONOPIN) 0.5 MG tablet  Please see After Visit Summary for patient specific instructions.  Future Appointments  Date Time Provider [place]  08/31/2018  3:00 PM Trula Slade, [place] [facility]  09/27/2018  1:00 PM Shamleffer, Melanie Crazier, MD [facility] None  09/27/2018  2:00 PM Clydell Hakim, RD [facility]  10/11/2018 10:00 AM Mosie Lukes, MD [facility]    No orders of the defined types were placed in this encounter.     -------------------------------

## 2018-08-18 NOTE — Telephone Encounter (Signed)
MEDICATION: One Touch Verio Flex Needles and One Touch Verio Flex Strips  PHARMACY:  [facility] [place] [facility]   IS PATIENT OUT OF MEDICATION: yes  IF NOT; HOW MUCH IS LEFT:   LAST APPOINTMENT DATE: @3 /23/2020  NEXT APPOINTMENT DATE:@5 /08/2018  DO WE HAVE YOUR PERMISSION TO LEAVE A DETAILED MESSAGE: YES  OTHER COMMENTS:    **Let patient know to contact pharmacy at the end of the day to make sure medication is ready. **  ** Please notify patient to allow 48-72 hours to process**  **Encourage patient to contact the pharmacy for refills or they can request refills through [facility]**

## 2018-08-18 NOTE — Telephone Encounter (Signed)
Refills sent

## 2018-08-19 ENCOUNTER — Ambulatory Visit: Payer: PPO | Admitting: Family Medicine

## 2018-08-20 ENCOUNTER — Ambulatory Visit: Payer: PPO | Admitting: Psychiatry

## 2018-08-20 ENCOUNTER — Telehealth: Payer: Self-pay | Admitting: Internal Medicine

## 2018-08-20 ENCOUNTER — Encounter: Payer: Self-pay | Admitting: Psychiatry

## 2018-08-20 NOTE — Telephone Encounter (Signed)
Pt wanted to know how long she needed to be on metformin for it to begin working, she stated that she would call us back in 3-4 weeks when the medication has had time to become effective in her system

## 2018-08-20 NOTE — Telephone Encounter (Signed)
Patient is requesting a call back to discuss the Metformin that Dr Kelton Pillar prescribed her

## 2018-08-24 ENCOUNTER — Other Ambulatory Visit: Payer: Self-pay | Admitting: Family Medicine

## 2018-08-26 ENCOUNTER — Other Ambulatory Visit: Payer: Self-pay | Admitting: Family Medicine

## 2018-08-26 NOTE — Telephone Encounter (Signed)
Copied from [place] [id number]. Topic: Quick Communication - Rx Refill/Question >> Aug 26, 2018  4:28 PM Rayann Heman wrote: Medication:pantoprazole (PROTONIX) 40 MG tablet [[id number]] metoprolol succinate (TOPROL-XL) 25 MG 24 hr tablet [[id number]] rosuvastatin (CRESTOR) 40 MG tablet [[id number]] rOPINIRole (REQUIP) 0.25 MG tablet [[id number]]     Has the patient contacted their pharmacy?  (Agent: If no, request that the patient contact the pharmacy for the refill.) (Agent: If yes, when and what did the pharmacy advise?)  Preferred Pharmacy (with phone number or street name): [facility]) - [place], [place] [phone] (Phone) [phone] (Fax)    Agent: Please be advised that RX refills may take up to 3 business days. We ask that you follow-up with your pharmacy.

## 2018-08-27 ENCOUNTER — Other Ambulatory Visit: Payer: Self-pay

## 2018-08-27 ENCOUNTER — Telehealth: Payer: Self-pay | Admitting: Internal Medicine

## 2018-08-27 ENCOUNTER — Telehealth: Payer: Self-pay | Admitting: Psychiatry

## 2018-08-27 ENCOUNTER — Telehealth: Payer: Self-pay | Admitting: Interventional Cardiology

## 2018-08-27 DIAGNOSIS — F3181 Bipolar II disorder: Secondary | ICD-10-CM

## 2018-08-27 DIAGNOSIS — F5105 Insomnia due to other mental disorder: Secondary | ICD-10-CM

## 2018-08-27 DIAGNOSIS — F411 Generalized anxiety disorder: Secondary | ICD-10-CM

## 2018-08-27 DIAGNOSIS — F99 Mental disorder, not otherwise specified: Secondary | ICD-10-CM

## 2018-08-27 MED ORDER — METFORMIN HCL ER 500 MG PO TB24: 1000.0000 mg | ORAL_TABLET | Freq: Two times a day (BID) | ORAL | 2 refills | Status: DC

## 2018-08-27 MED ORDER — ROSUVASTATIN CALCIUM 40 MG PO TABS: 40.0000 mg | ORAL_TABLET | Freq: Every day | ORAL | 1 refills | Status: DC

## 2018-08-27 MED ORDER — QUETIAPINE FUMARATE ER 300 MG PO TB24: 600.0000 mg | ORAL_TABLET | Freq: Every evening | ORAL | 0 refills | Status: DC

## 2018-08-27 MED ORDER — OXCARBAZEPINE 150 MG PO TABS: 150.0000 mg | ORAL_TABLET | Freq: Two times a day (BID) | ORAL | 0 refills | Status: DC

## 2018-08-27 MED ORDER — VENLAFAXINE HCL ER 75 MG PO CP24: 75.0000 mg | ORAL_CAPSULE | Freq: Every day | ORAL | 1 refills | Status: DC

## 2018-08-27 MED ORDER — AMLODIPINE BESYLATE 5 MG PO TABS: 5.0000 mg | ORAL_TABLET | Freq: Every day | ORAL | 1 refills | Status: DC

## 2018-08-27 MED ORDER — ROPINIROLE HCL 0.25 MG PO TABS: 0.2500 mg | ORAL_TABLET | Freq: Every day | ORAL | 1 refills | Status: DC

## 2018-08-27 MED ORDER — LAMOTRIGINE 100 MG PO TABS: ORAL_TABLET | ORAL | 0 refills | Status: DC

## 2018-08-27 MED ORDER — WARFARIN SODIUM 5 MG PO TABS: ORAL_TABLET | ORAL | 0 refills | Status: DC

## 2018-08-27 MED ORDER — PANTOPRAZOLE SODIUM 40 MG PO TBEC: 40.0000 mg | DELAYED_RELEASE_TABLET | Freq: Every day | ORAL | 3 refills | Status: DC

## 2018-08-27 MED ORDER — METOPROLOL SUCCINATE ER 25 MG PO TB24: 25.0000 mg | ORAL_TABLET | Freq: Every morning | ORAL | 1 refills | Status: DC

## 2018-08-27 MED ORDER — FUROSEMIDE 40 MG PO TABS: 40.0000 mg | ORAL_TABLET | Freq: Every day | ORAL | 1 refills | Status: DC

## 2018-08-27 MED ORDER — CLONAZEPAM 0.5 MG PO TABS: 0.5000 mg | ORAL_TABLET | Freq: Every day | ORAL | 0 refills | Status: DC

## 2018-08-27 NOTE — Telephone Encounter (Signed)
New Message     *STAT* If patient is at the pharmacy, call can be transferred to refill team.   1. Which medications need to be refilled? (please list name of each medication and dose if known) Warfarin, Furosemide, Amlodipine   2. Which pharmacy/location (including street and city if local pharmacy) is medication to be sent to? [facility], [facility] Fax # [phone]  3. Do they need a 30 day or 90 day supply? 90 Days

## 2018-08-27 NOTE — Telephone Encounter (Signed)
Pt requesting refill for WARFARIN 5 mg please address.

## 2018-08-27 NOTE — Telephone Encounter (Signed)
sent 

## 2018-08-27 NOTE — Telephone Encounter (Signed)
Refills sent

## 2018-08-27 NOTE — Telephone Encounter (Signed)
Overdue for follow-up, has appt scheduled for 09/01/18 will refill x 1 onlt.  Pt must keep ap[pt for future refills.

## 2018-08-27 NOTE — Telephone Encounter (Signed)
MEDICATION: Metformin  [facility]ACY:  [facility]  IS THIS A 90 DAY SUPPLY : yes  IS PATIENT OUT OF MEDICATION: no  IF NOT; HOW MUCH IS LEFT:   LAST APPOINTMENT DATE: @3 /27/2020  NEXT APPOINTMENT DATE:@5 /08/2018  DO WE HAVE YOUR PERMISSION TO LEAVE A DETAILED MESSAGE: yes  OTHER COMMENTS: due to insurance patient is switching all prescriptions to mail orders via [facility] and they need to be 90 days instead of 30 day  **Let patient know to contact pharmacy at the end of the day to make sure medication is ready. **  ** Please notify patient to allow 48-72 hours to process**  **Encourage patient to contact the pharmacy for refills or they can request refills through [facility]** golden

## 2018-08-27 NOTE — Telephone Encounter (Signed)
Patient requesting prescriptions be transferred  from [facility] to mail order.

## 2018-08-31 ENCOUNTER — Telehealth: Payer: Self-pay

## 2018-08-31 ENCOUNTER — Ambulatory Visit: Payer: PPO | Admitting: Podiatry

## 2018-08-31 NOTE — Telephone Encounter (Signed)

## 2018-09-01 ENCOUNTER — Ambulatory Visit (INDEPENDENT_AMBULATORY_CARE_PROVIDER_SITE_OTHER): Payer: PPO

## 2018-09-01 ENCOUNTER — Other Ambulatory Visit: Payer: Self-pay

## 2018-09-01 DIAGNOSIS — M25552 Pain in left hip: Secondary | ICD-10-CM | POA: Diagnosis not present

## 2018-09-01 DIAGNOSIS — M25562 Pain in left knee: Secondary | ICD-10-CM | POA: Diagnosis not present

## 2018-09-01 DIAGNOSIS — Z952 Presence of prosthetic heart valve: Secondary | ICD-10-CM | POA: Diagnosis not present

## 2018-09-01 DIAGNOSIS — Z5181 Encounter for therapeutic drug level monitoring: Secondary | ICD-10-CM | POA: Diagnosis not present

## 2018-09-01 DIAGNOSIS — I4891 Unspecified atrial fibrillation: Secondary | ICD-10-CM | POA: Diagnosis not present

## 2018-09-01 DIAGNOSIS — M9905 Segmental and somatic dysfunction of pelvic region: Secondary | ICD-10-CM | POA: Diagnosis not present

## 2018-09-01 DIAGNOSIS — M5384 Other specified dorsopathies, thoracic region: Secondary | ICD-10-CM | POA: Diagnosis not present

## 2018-09-01 DIAGNOSIS — M9902 Segmental and somatic dysfunction of thoracic region: Secondary | ICD-10-CM | POA: Diagnosis not present

## 2018-09-01 DIAGNOSIS — M1712 Unilateral primary osteoarthritis, left knee: Secondary | ICD-10-CM | POA: Diagnosis not present

## 2018-09-01 LAB — POCT INR: INR: 2.6 (ref 2.0–3.0)

## 2018-09-01 NOTE — Patient Instructions (Addendum)
  Description   Spoke with pt and instructed to continue on same dosage 1/2 tablet daily except 1 tablet on Tuesdays, Thursdays, and Saturdays. Recheck INR in 4 weeks.

## 2018-09-14 ENCOUNTER — Other Ambulatory Visit: Payer: Self-pay | Admitting: Family Medicine

## 2018-09-19 ENCOUNTER — Other Ambulatory Visit: Payer: Self-pay | Admitting: Interventional Cardiology

## 2018-09-26 ENCOUNTER — Other Ambulatory Visit: Payer: Self-pay | Admitting: Interventional Cardiology

## 2018-09-26 ENCOUNTER — Other Ambulatory Visit: Payer: Self-pay | Admitting: Family Medicine

## 2018-09-27 ENCOUNTER — Ambulatory Visit: Payer: PPO | Admitting: Internal Medicine

## 2018-09-27 ENCOUNTER — Other Ambulatory Visit: Payer: Self-pay | Admitting: Family Medicine

## 2018-09-27 ENCOUNTER — Telehealth: Payer: Self-pay

## 2018-09-27 ENCOUNTER — Telehealth: Payer: Self-pay | Admitting: Family Medicine

## 2018-09-27 ENCOUNTER — Ambulatory Visit: Payer: PPO | Admitting: Dietician

## 2018-09-27 NOTE — Telephone Encounter (Signed)

## 2018-09-27 NOTE — Telephone Encounter (Signed)
Copied from [place] [id number]. Topic: Quick Communication - Rx Refill/Question >> Sep 27, 2018  2:19 PM [name] A wrote: Medication: amLODipine (NORVASC) 5 MG tablet and furosemide (LASIX) 40 MG tablet  Has the patient contacted their pharmacy? Yes.  Pt is requesting these be sent to the new pharmacy.  (Agent: If no, request that the patient contact the pharmacy for the refill.) (Agent: If yes, when and what did the pharmacy advise?)  Preferred Pharmacy (with phone number or street name): [facility] ([place], [place] [address] Phone: [phone] Fax: [phone] Not a 24 hour pharmacy; exact hours not known.    Agent: Please be advised that RX refills may take up to 3 business days. We ask that you follow-up with your pharmacy.

## 2018-09-27 NOTE — Telephone Encounter (Signed)
Spoke with Mrs. Godeaux she stated she will give office a call back to schedule her Medicare Wellness appointment. SF

## 2018-09-28 NOTE — Telephone Encounter (Signed)
These are prescribed by Dr. Daneen Schick.

## 2018-09-29 ENCOUNTER — Ambulatory Visit (INDEPENDENT_AMBULATORY_CARE_PROVIDER_SITE_OTHER): Payer: PPO | Admitting: *Deleted

## 2018-09-29 ENCOUNTER — Other Ambulatory Visit: Payer: Self-pay

## 2018-09-29 DIAGNOSIS — I4891 Unspecified atrial fibrillation: Secondary | ICD-10-CM

## 2018-09-29 DIAGNOSIS — Z952 Presence of prosthetic heart valve: Secondary | ICD-10-CM

## 2018-09-29 DIAGNOSIS — Z5181 Encounter for therapeutic drug level monitoring: Secondary | ICD-10-CM

## 2018-09-29 LAB — POCT INR: INR: 2.5 (ref 2.0–3.0)

## 2018-09-29 NOTE — Patient Instructions (Signed)
Spoke with pt and instructed to continue on same dosage 1/2 tablet daily except 1 tablet on Tuesdays, Thursdays, and Saturdays. Recheck INR in 4 weeks.

## 2018-10-11 ENCOUNTER — Other Ambulatory Visit: Payer: Self-pay

## 2018-10-11 ENCOUNTER — Encounter: Payer: Self-pay | Admitting: Family Medicine

## 2018-10-11 ENCOUNTER — Ambulatory Visit (INDEPENDENT_AMBULATORY_CARE_PROVIDER_SITE_OTHER): Payer: PPO | Admitting: Family Medicine

## 2018-10-11 DIAGNOSIS — I10 Essential (primary) hypertension: Secondary | ICD-10-CM

## 2018-10-11 DIAGNOSIS — I4891 Unspecified atrial fibrillation: Secondary | ICD-10-CM | POA: Diagnosis not present

## 2018-10-11 DIAGNOSIS — F319 Bipolar disorder, unspecified: Secondary | ICD-10-CM | POA: Diagnosis not present

## 2018-10-11 DIAGNOSIS — E1142 Type 2 diabetes mellitus with diabetic polyneuropathy: Secondary | ICD-10-CM

## 2018-10-11 DIAGNOSIS — N289 Disorder of kidney and ureter, unspecified: Secondary | ICD-10-CM

## 2018-10-11 DIAGNOSIS — E785 Hyperlipidemia, unspecified: Secondary | ICD-10-CM

## 2018-10-11 DIAGNOSIS — E1169 Type 2 diabetes mellitus with other specified complication: Secondary | ICD-10-CM

## 2018-10-11 MED ORDER — GLUCOSE BLOOD VI STRP: ORAL_STRIP | 1 refills | Status: DC

## 2018-10-11 NOTE — Assessment & Plan Note (Signed)
Asymptomatic, her INR is being followed by cardiology

## 2018-10-11 NOTE — Assessment & Plan Note (Signed)
Managing with help of psychiatry, reviewed meds

## 2018-10-11 NOTE — Progress Notes (Signed)
Virtual Visit via Telephone Note  I connected with [name] on 10/11/18 at 10:00 AM EDT by a telephone enabled telemedicine application and verified that I am speaking with the correct person using two identifiers.  Location: Patient: home Provider: home   I discussed the limitations of evaluation and management by telemedicine and the availability of in person appointments. The patient expressed understanding and agreed to proceed. Alinda Dooms, CMA tried to get patient set up on video platform but patient was unable to proceed so we completed visit via telephone    Subjective:    Patient ID: [name], female    DOB: 03/16/1951, 68 y.o.   MRN: [id number]  Chief Complaint  Patient presents with  . Diabetes    Pt states no new concerns  . Follow-up    HPI Patient is in today for follow up onc hronic medical concerns including diabetes, bipolar, hypertension, afib and more. Overall she is stable. She follows closely with psychiatry and she fels she is managing the anxiety of the pandemic on her current meds fairly well. No recent febrile illness or hospitalizations. She is able to stay in and quarantine. Denies CP/palp/SOB/HA/congestion/fevers/GI or GU c/o. Taking meds as prescribed  Past Medical History:  Diagnosis Date  . ANEMIA 05/28/2010  . AORTIC VALVE REPLACEMENT, HX OF 03/11/2010   Mechanical prosthesis  . BIPOLAR AFFECTIVE DISORDER 03/11/2010  . Depression   . Diverticulitis 12/18/2010  . FIBROIDS, UTERUS 03/11/2010  . Gout 09/04/2016  . Grave's disease 8-12  . Hyperlipidemia associated with type 2 diabetes mellitus ([place]) 06/05/2016  . Hypertension   . Low back pain 11/02/2017  . Migraine 06/05/2016  . Mixed hyperlipidemia 03/11/2010  . Obesity   . UTI (lower urinary tract infection) 08/14/2011    Past Surgical History:  Procedure Laterality Date  . ABDOMINAL HYSTERECTOMY  ?1998   sb/l spo, total for fibroids/heavy bleeding  . ANKLE SURGERY Left 01/2018   hardware removal  . AORTIC VALVE REPLACEMENT  2011   Mechanical prosthesis, [employer]  . APPENDECTOMY     Required revision for EColi infection, required recurrent packing  . bowel obstruction     Requiring adhesions to be removed  . CHOLECYSTECTOMY    . COLONOSCOPY WITH PROPOFOL N/A 11/01/2015   Procedure: COLONOSCOPY WITH PROPOFOL;  Surgeon: Milus Banister, MD;  Location: [facility];  Service: Endoscopy;  Laterality: N/A;  . CYSTOCELE REPAIR N/A 10/16/2015   Procedure: ANTERIOR REPAIR (CYSTOCELE);  Surgeon: Donnamae Jude, MD;  Location: [facility];  Service: Gynecology;  Laterality: N/A;  . HERNIA REPAIR  08-16-10  . LEFT HEART CATHETERIZATION WITH CORONARY ANGIOGRAM N/A 12/05/2011   Procedure: LEFT HEART CATHETERIZATION WITH CORONARY ANGIOGRAM;  Surgeon: Sinclair Grooms, MD;  Location: [facility] CATH LAB;  Service: Cardiovascular;  Laterality: N/A;  . TOTAL KNEE ARTHROPLASTY Right 02/24/2013   Procedure: RIGHT TOTAL KNEE ARTHROPLASTY;  Surgeon: Johnn Hai, MD;  Location: [facility];  Service: Orthopedics;  Laterality: Right;  . TUBAL LIGATION    . varicose vein surgery b/l legs      Family History  Problem Relation Age of Onset  . Scoliosis Mother   . Arthritis Mother        Rheumatoid  . Aneurysm Mother        heart  . Alcohol abuse Father   . Depression Sister   . Depression Brother   . Alcohol abuse Brother   . Cancer Maternal Grandmother  colon  . Diabetes Maternal Grandfather   . Heart disease Maternal Grandfather   . Alcohol abuse Maternal Grandfather   . Depression Paternal Grandmother   . Diabetes Paternal Grandmother   . Depression Paternal Grandfather   . Diabetes Paternal Grandfather   . Depression Sister   . Alcohol abuse Brother   . Depression Brother   . Heart disease Brother     Social History   Socioeconomic History  . Marital status: Married    Spouse name: Not on file  . Number of children: Not on file  . Years of education: Not on file  . Highest  education level: Not on file  Occupational History  . Not on file  Social Needs  . Financial resource strain: Not on file  . Food insecurity:    Worry: Not on file    Inability: Not on file  . Transportation needs:    Medical: Not on file    Non-medical: Not on file  Tobacco Use  . Smoking status: Former Smoker    Last attempt to quit: 05/26/1990    Years since quitting: 28.3  . Smokeless tobacco: Never Used  Substance and Sexual Activity  . Alcohol use: Yes    Alcohol/week: 0.0 standard drinks    Comment: rare  . Drug use: No  . Sexual activity: Not Currently  Lifestyle  . Physical activity:    Days per week: Not on file    Minutes per session: Not on file  . Stress: Not on file  Relationships  . Social connections:    Talks on phone: Not on file    Gets together: Not on file    Attends religious service: Not on file    Active member of club or organization: Not on file    Attends meetings of clubs or organizations: Not on file    Relationship status: Not on file  . Intimate partner violence:    Fear of current or ex partner: Not on file    Emotionally abused: Not on file    Physically abused: Not on file    Forced sexual activity: Not on file  Other Topics Concern  . Not on file  Social History Narrative  . Not on file    Outpatient Medications Prior to Visit  Medication Sig Dispense Refill  . acetaminophen (TYLENOL) 500 MG tablet Take 1,000-1,500 mg by mouth every 6 (six) hours as needed (For knee pain.).    Marland Kitchen amLODipine (NORVASC) 5 MG tablet TAKE ONE TABLET BY MOUTH DAILY 90 tablet 1  . aspirin EC 81 MG tablet Take 81 mg by mouth daily.    . clonazePAM (KLONOPIN) 0.5 MG tablet Take 1 tablet (0.5 mg total) by mouth at bedtime. 90 tablet 0  . docusate sodium (COLACE) 100 MG capsule Take 1 capsule (100 mg total) by mouth 2 (two) times daily. While taking narcotic pain medicine. 30 capsule 0  . fenofibrate micronized (ANTARA) 130 MG capsule Take 1 capsule (130 mg  total) by mouth daily before breakfast. 30 capsule 3  . furosemide (LASIX) 40 MG tablet TAKE ONE TABLET BY MOUTH DAILY *PLEASE KEEP UPCOMING APPOINTMENT* 90 tablet 1  . lamoTRIgine (LAMICTAL) 100 MG tablet Take 100 mg by mouth in the morning and 200 mg by mouth at bedtime 270 tablet 0  . Lancets (ONETOUCH ULTRASOFT) lancets Use as instructed to test blood sugars once daily Dx. E11.65 100 each 12  . metFORMIN (GLUCOPHAGE XR) 500 MG 24 hr tablet Take  2 tablets (1,000 mg total) by mouth 2 (two) times daily with a meal. 360 tablet 2  . metoprolol succinate (TOPROL-XL) 25 MG 24 hr tablet TAKE ONE TABLET BY MOUTH EVERY MORNING 90 tablet 0  . ONE TOUCH LANCETS MISC Test once daily to check blood sugar. DX E11.9 100 each 3  . OXcarbazepine (TRILEPTAL) 150 MG tablet Take 1 tablet (150 mg total) by mouth 2 (two) times daily. 180 tablet 0  . pantoprazole (PROTONIX) 40 MG tablet Take 1 tablet (40 mg total) by mouth daily. 90 tablet 3  . Polyethyl Glycol-Propyl Glycol (SYSTANE OP) Place 1 drop into both eyes at bedtime as needed (For dry eyes.).     Marland Kitchen Probiotic Product (ALIGN) 4 MG CAPS Take 4 mg by mouth daily.    . QUEtiapine (SEROQUEL XR) 300 MG 24 hr tablet Take 2 tablets (600 mg total) by mouth every evening. 180 tablet 0  . rOPINIRole (REQUIP) 0.25 MG tablet TAKE ONE TO TWO TABLETS BY MOUTH AT BEDTIME 60 tablet 0  . rosuvastatin (CRESTOR) 40 MG tablet Take 1 tablet (40 mg total) by mouth daily. 90 tablet 1  . senna (SENOKOT) 8.6 MG TABS tablet Take 2 tablets (17.2 mg total) by mouth 2 (two) times daily. 30 each 0  . SUMAtriptan (IMITREX) 50 MG tablet TAKE ONE TABLET EVERY 2 HOURS AS NEEDED FOR MIGRAINE. MAY REPEAT IN 2 HOURS IF HEADACHE PERSISTS OR RECURS. (MAX OF 2 TABLETS IN 24 HOURS) 10 tablet 0  . venlafaxine XR (EFFEXOR XR) 75 MG 24 hr capsule Take 1 capsule (75 mg total) by mouth daily with breakfast. 90 capsule 1  . warfarin (COUMADIN) 5 MG tablet Take as directed by coumadin clinic 90 tablet 0  .  fenofibrate 160 MG tablet Take 160 mg by mouth daily.    Marland Kitchen glucose blood (ONE TOUCH ULTRA TEST) test strip USE AS DIRECTED ONCE DAILY FOR BLOOD SUGAR DX E11.65 100 each 11   No facility-administered medications prior to visit.     Allergies  Allergen Reactions  . Mucinex [Guaifenesin Er] Other (See Comments)    Severe headaches   . Keflex [Cephalexin] Other (See Comments)    Headaches and dizziness  . Sulfonamide Derivatives Other (See Comments)    Headaches     Review of Systems  Constitutional: Positive for malaise/fatigue. Negative for fever.  HENT: Negative for congestion.   Eyes: Negative for blurred vision.  Respiratory: Negative for shortness of breath.   Cardiovascular: Negative for chest pain, palpitations and leg swelling.  Gastrointestinal: Negative for abdominal pain, blood in stool and nausea.  Genitourinary: Negative for dysuria and frequency.  Musculoskeletal: Negative for falls.  Skin: Negative for rash.  Neurological: Negative for dizziness, loss of consciousness and headaches.  Endo/Heme/Allergies: Negative for environmental allergies.  Psychiatric/Behavioral: Negative for depression. The patient is nervous/anxious.        Objective:    Physical Exam unable to complete due to telephone encounter.   There were no vitals taken for this visit. Wt Readings from Last 3 Encounters:  08/16/18 251 lb 6.4 oz (114 kg)  03/18/18 243 lb 6.4 oz (110.4 kg)  02/03/18 240 lb (108.9 kg)    Diabetic Foot Exam - Simple   No data filed     Lab Results  Component Value Date   WBC 7.1 03/18/2018   HGB 12.8 03/18/2018   HCT 38.2 03/18/2018   PLT 254.0 03/18/2018   GLUCOSE 397 (H) 08/16/2018   CHOL 185 03/18/2018  TRIG (H) 03/18/2018    553.0 Triglyceride is over 400; calculations on Lipids are invalid.   HDL 44.30 03/18/2018   LDLDIRECT 79.0 03/18/2018   LDLCALC 71 04/04/2013   ALT 22 03/18/2018   AST 20 03/18/2018   NA 133 (L) 08/16/2018   K 4.3  08/16/2018   CL 95 (L) 08/16/2018   CREATININE 0.97 08/16/2018   BUN 17 08/16/2018   CO2 27 08/16/2018   TSH 2.46 03/18/2018   INR 2.5 09/29/2018   HGBA1C 8.7 (A) 08/16/2018   MICROALBUR 2.3 (H) 08/16/2018    Lab Results  Component Value Date   TSH 2.46 03/18/2018   Lab Results  Component Value Date   WBC 7.1 03/18/2018   HGB 12.8 03/18/2018   HCT 38.2 03/18/2018   MCV 89.5 03/18/2018   PLT 254.0 03/18/2018   Lab Results  Component Value Date   NA 133 (L) 08/16/2018   K 4.3 08/16/2018   CO2 27 08/16/2018   GLUCOSE 397 (H) 08/16/2018   BUN 17 08/16/2018   CREATININE 0.97 08/16/2018   BILITOT 0.5 03/18/2018   ALKPHOS 58 03/18/2018   AST 20 03/18/2018   ALT 22 03/18/2018   PROT 6.8 03/18/2018   ALBUMIN 4.3 03/18/2018   CALCIUM 9.2 08/16/2018   ANIONGAP 10 02/04/2018   GFR 57.16 (L) 08/16/2018   Lab Results  Component Value Date   CHOL 185 03/18/2018   Lab Results  Component Value Date   HDL 44.30 03/18/2018   Lab Results  Component Value Date   LDLCALC 71 04/04/2013   Lab Results  Component Value Date   TRIG (H) 03/18/2018    553.0 Triglyceride is over 400; calculations on Lipids are invalid.   Lab Results  Component Value Date   CHOLHDL 4 03/18/2018   Lab Results  Component Value Date   HGBA1C 8.7 (A) 08/16/2018       Assessment & Plan:   Problem List Items Addressed This Visit    Bipolar disorder ([place])    Managing with help of psychiatry, reviewed meds      Renal insufficiency    Hydrate and monitor      Atrial fibrillation ([place])    Asymptomatic, her INR is being followed by cardiology      Essential hypertension, benign    no changes to meds. Encouraged heart healthy diet such as the DASH diet and exercise as tolerated.       Hyperlipidemia associated with type 2 diabetes mellitus ([place])    Encouraged heart healthy diet, increase exercise, avoid trans fats, consider a krill oil cap daily      Type 2 diabetes mellitus with  diabetic polyneuropathy, without long-term current use of insulin ([place])    Has been following with endocrinology and last hgba1c was on March 23 and was notably worse than the previous number. She is trying to maintain a heart healthy diet and her average daily sugar is around 150. She is sent in a new prescription for more test strips and we can have a visit in 6-8 weeks when it is time to repeat labs and reassess. Continue current meds for now         I have discontinued Alyanah C. Huaracha's fenofibrate and glucose blood. I am also having her maintain her ONE TOUCH LANCETS, aspirin EC, Align, Polyethyl Glycol-Propyl Glycol (SYSTANE OP), acetaminophen, SUMAtriptan, senna, docusate sodium, fenofibrate micronized, onetouch ultrasoft, pantoprazole, rosuvastatin, warfarin, metFORMIN, lamoTRIgine, OXcarbazepine, QUEtiapine, venlafaxine XR, clonazePAM, rOPINIRole, amLODipine, furosemide, and metoprolol  succinate.  No orders of the defined types were placed in this encounter.  I discussed the assessment and treatment plan with the patient. The patient was provided an opportunity to ask questions and all were answered. The patient agreed with the plan and demonstrated an understanding of the instructions.   The patient was advised to call back or seek an in-person evaluation if the symptoms worsen or if the condition fails to improve as anticipated.  I provided 25 minutes of non-face-to-face time during this encounter.   Penni Homans, MD

## 2018-10-11 NOTE — Assessment & Plan Note (Signed)
Encouraged heart healthy diet, increase exercise, avoid trans fats, consider a krill oil cap daily 

## 2018-10-11 NOTE — Assessment & Plan Note (Signed)
no changes to meds. Encouraged heart healthy diet such as the DASH diet and exercise as tolerated.  

## 2018-10-11 NOTE — Assessment & Plan Note (Signed)
Has been following with endocrinology and last hgba1c was on March 23 and was notably worse than the previous number. She is trying to maintain a heart healthy diet and her average daily sugar is around 150. She is sent in a new prescription for more test strips and we can have a visit in 6-8 weeks when it is time to repeat labs and reassess. Continue current meds for now

## 2018-10-11 NOTE — Assessment & Plan Note (Signed)
Hydrate and monitor 

## 2018-10-12 ENCOUNTER — Telehealth: Payer: Self-pay | Admitting: Family Medicine

## 2018-10-12 MED ORDER — GLUCOSE BLOOD VI STRP: ORAL_STRIP | 12 refills | Status: DC

## 2018-10-12 NOTE — Telephone Encounter (Signed)
Rx resent w/ sig and dx code.

## 2018-10-12 NOTE — Telephone Encounter (Signed)
Copied from [place] [phone]. Topic: Quick Communication - Rx Refill/Question >> Oct 12, 2018  2:49 PM Sheran Luz wrote: Medication:glucose blood (ONETOUCH VERIO) test strip  Hope, with [facility], calling to request clarification on patient's test strips. She is requesting the RX be resent electronically with frequency that patient is to test. Please advise.

## 2018-10-18 ENCOUNTER — Encounter: Payer: Self-pay | Admitting: Gastroenterology

## 2018-10-25 ENCOUNTER — Telehealth: Payer: Self-pay

## 2018-10-25 NOTE — Telephone Encounter (Signed)

## 2018-11-08 ENCOUNTER — Ambulatory Visit (INDEPENDENT_AMBULATORY_CARE_PROVIDER_SITE_OTHER): Payer: PPO | Admitting: *Deleted

## 2018-11-08 ENCOUNTER — Other Ambulatory Visit: Payer: Self-pay

## 2018-11-08 DIAGNOSIS — Z952 Presence of prosthetic heart valve: Secondary | ICD-10-CM

## 2018-11-08 DIAGNOSIS — I4891 Unspecified atrial fibrillation: Secondary | ICD-10-CM | POA: Diagnosis not present

## 2018-11-08 DIAGNOSIS — Z5181 Encounter for therapeutic drug level monitoring: Secondary | ICD-10-CM | POA: Diagnosis not present

## 2018-11-08 LAB — POCT INR: INR: 2.5 (ref 2.0–3.0)

## 2018-11-08 NOTE — Patient Instructions (Signed)
Description    Continue on same dosage 1/2 tablet daily except 1 tablet on Tuesdays, Thursdays, and Saturdays. Recheck INR in 5 weeks.

## 2018-11-19 ENCOUNTER — Telehealth: Payer: Self-pay | Admitting: Family Medicine

## 2018-11-19 NOTE — Telephone Encounter (Signed)
Copied from [place] [phone]. Topic: General - Other >> Nov 19, 2018  1:27 PM [name] A wrote: Reason for CRM: Pt returning call to [place]. Please advise. Have spoken to her already, but is regarding her husband. Message documented on her husbands chart...this message was not regarding [name].

## 2018-11-22 ENCOUNTER — Encounter: Payer: Self-pay | Admitting: Gastroenterology

## 2018-11-22 ENCOUNTER — Telehealth: Payer: Self-pay

## 2018-11-22 ENCOUNTER — Ambulatory Visit (INDEPENDENT_AMBULATORY_CARE_PROVIDER_SITE_OTHER): Payer: PPO | Admitting: Gastroenterology

## 2018-11-22 VITALS — Ht 68.0 in | Wt 250.0 lb

## 2018-11-22 DIAGNOSIS — Z86010 Personal history of colonic polyps: Secondary | ICD-10-CM

## 2018-11-22 DIAGNOSIS — R197 Diarrhea, unspecified: Secondary | ICD-10-CM

## 2018-11-22 DIAGNOSIS — Z7901 Long term (current) use of anticoagulants: Secondary | ICD-10-CM

## 2018-11-22 MED ORDER — PEG 3350-KCL-NA BICARB-NACL 420 G PO SOLR: 4000.0000 mL | ORAL | 0 refills | Status: DC

## 2018-11-22 NOTE — Telephone Encounter (Signed)
[facility] Pre-operative Risk Assessment     Request for surgical clearance:     Endoscopy Procedure  What type of surgery is being performed?     colonoscopy  When is this surgery scheduled?     12/22/18  What type of clearance is required ?   Pharmacy  Are there any medications that need to be held prior to surgery and how long? Coumadin 5 day hold  Practice name and name of physician performing surgery?      [facility]  What is your office phone and fax number?      Phone- [phone]  [phone]  Anesthesia type (None, local, MAC, general) ?       MAC

## 2018-11-22 NOTE — Telephone Encounter (Signed)
Please comment on coumadin recommendations.

## 2018-11-22 NOTE — Patient Instructions (Addendum)
We will arrange a colonoscopy at her soonest convenience for polyp surveillance and also chronic diarrhea.   We will reach out to the Coumadin clinic about holding her Coumadin for 5 days prior to the colonoscopy.  Previously she has been "bridged" with Lovenox and we will see if that is what they want her to do again.  Thank you for entrusting me with your care and choosing [facility].  Dr Ardis Hughs

## 2018-11-22 NOTE — Progress Notes (Signed)
Review of pertinent gastrointestinal problems: 1. Personal history of adenomatous polyps : colonoscopy June 2017; 4 subcentimeter adenomas were removed, diverticulosis was noted throughout the colon.  She was recommended to have repeat colonoscopy at 3-year interval. 2.  Mechanical heart valve   This service was provided via virtual visit.  Only audio was used.  The patient was located at home.  I was located in my office.  The patient did consent to this virtual visit and is aware of possible charges through their insurance for this visit.  The patient is an established patient.  My certified medical assistant, Grace Bushy, contributed to this visit by contacting the patient by phone 1 or 2 business days prior to the appointment and also followed up on the recommendations I made after the visit.  Time spent on virtual visit: 26 minutes   HPI: This is a very pleasant 68 year old woman whom I last saw about 3 years ago the time of a colonoscopy.  See that results summarized above.  We discussed the fact that she is "due" for polyp surveillance colonoscopy given her for subcentimeter adenomas removed 3 years ago.  I asked her about her general bowel habits and she told me that she has diarrhea very often. One day on her way to the quilt shop with a girlfriend, she had fecal incontinence.  This was about a year ago.   She has diarrhea often.  Never sees blood in her stool.  She does not take Imodium for fear of being constipated.  She was recently started on metformin 3 to 4 months ago but her diarrhea predates this.  Hemoglobin A1c March 2020 8.7  Overall stable weight.  Maternal GM had colon cancer.  She takes coumadin for mechanical aortic valve.   Chief complaint is personal history of adenomatous colon polyps, chronic loose stools, chronic blood thinner use  ROS: complete GI ROS as described in HPI, all other review negative.  Constitutional:  No unintentional weight  loss   Past Medical History:  Diagnosis Date  . ANEMIA 05/28/2010  . AORTIC VALVE REPLACEMENT, HX OF 03/11/2010   Mechanical prosthesis  . BIPOLAR AFFECTIVE DISORDER 03/11/2010  . Depression   . Diverticulitis 12/18/2010  . FIBROIDS, UTERUS 03/11/2010  . Gout 09/04/2016  . Grave's disease 8-12  . Hyperlipidemia associated with type 2 diabetes mellitus ([place]) 06/05/2016  . Hypertension   . Low back pain 11/02/2017  . Migraine 06/05/2016  . Mixed hyperlipidemia 03/11/2010  . Obesity   . UTI (lower urinary tract infection) 08/14/2011    Past Surgical History:  Procedure Laterality Date  . ABDOMINAL HYSTERECTOMY  ?1998   sb/l spo, total for fibroids/heavy bleeding  . ANKLE SURGERY Left 01/2018   hardware removal  . AORTIC VALVE REPLACEMENT  2011   Mechanical prosthesis, [employer]  . APPENDECTOMY     Required revision for EColi infection, required recurrent packing  . bowel obstruction     Requiring adhesions to be removed  . CHOLECYSTECTOMY    . COLONOSCOPY WITH PROPOFOL N/A 11/01/2015   Procedure: COLONOSCOPY WITH PROPOFOL;  Surgeon: Milus Banister, MD;  Location: [facility];  Service: Endoscopy;  Laterality: N/A;  . CYSTOCELE REPAIR N/A 10/16/2015   Procedure: ANTERIOR REPAIR (CYSTOCELE);  Surgeon: Donnamae Jude, MD;  Location: [facility];  Service: Gynecology;  Laterality: N/A;  . HERNIA REPAIR  08-16-10  . LEFT HEART CATHETERIZATION WITH CORONARY ANGIOGRAM N/A 12/05/2011   Procedure: LEFT HEART CATHETERIZATION WITH CORONARY ANGIOGRAM;  Surgeon: Sinclair Grooms, MD;  Location: [facility] CATH LAB;  Service: Cardiovascular;  Laterality: N/A;  . TOTAL KNEE ARTHROPLASTY Right 02/24/2013   Procedure: RIGHT TOTAL KNEE ARTHROPLASTY;  Surgeon: Johnn Hai, MD;  Location: [facility];  Service: Orthopedics;  Laterality: Right;  . TUBAL LIGATION    . varicose vein surgery b/l legs      Current Outpatient Medications  Medication Sig Dispense Refill  . acetaminophen (TYLENOL) 500 MG tablet Take  1,000-1,500 mg by mouth every 6 (six) hours as needed (For knee pain.).    Marland Kitchen amLODipine (NORVASC) 5 MG tablet TAKE ONE TABLET BY MOUTH DAILY 90 tablet 1  . aspirin EC 81 MG tablet Take 81 mg by mouth daily.    . clonazePAM (KLONOPIN) 0.5 MG tablet Take 1 tablet (0.5 mg total) by mouth at bedtime. 90 tablet 0  . docusate sodium (COLACE) 100 MG capsule Take 1 capsule (100 mg total) by mouth 2 (two) times daily. While taking narcotic pain medicine. 30 capsule 0  . fenofibrate micronized (ANTARA) 130 MG capsule Take 1 capsule (130 mg total) by mouth daily before breakfast. 30 capsule 3  . furosemide (LASIX) 40 MG tablet TAKE ONE TABLET BY MOUTH DAILY *PLEASE KEEP UPCOMING APPOINTMENT* 90 tablet 1  . glucose blood (ONETOUCH VERIO) test strip Check blood sugars once daily Dx: E11.9 100 each 12  . lamoTRIgine (LAMICTAL) 100 MG tablet Take 100 mg by mouth in the morning and 200 mg by mouth at bedtime 270 tablet 0  . Lancets (ONETOUCH ULTRASOFT) lancets Use as instructed to test blood sugars once daily Dx. E11.65 100 each 12  . metFORMIN (GLUCOPHAGE XR) 500 MG 24 hr tablet Take 2 tablets (1,000 mg total) by mouth 2 (two) times daily with a meal. 360 tablet 2  . metoprolol succinate (TOPROL-XL) 25 MG 24 hr tablet TAKE ONE TABLET BY MOUTH EVERY MORNING 90 tablet 0  . ONE TOUCH LANCETS MISC Test once daily to check blood sugar. DX E11.9 100 each 3  . OXcarbazepine (TRILEPTAL) 150 MG tablet Take 1 tablet (150 mg total) by mouth 2 (two) times daily. 180 tablet 0  . pantoprazole (PROTONIX) 40 MG tablet Take 1 tablet (40 mg total) by mouth daily. 90 tablet 3  . Polyethyl Glycol-Propyl Glycol (SYSTANE OP) Place 1 drop into both eyes at bedtime as needed (For dry eyes.).     Marland Kitchen Probiotic Product (ALIGN) 4 MG CAPS Take 4 mg by mouth daily.    . QUEtiapine (SEROQUEL XR) 300 MG 24 hr tablet Take 2 tablets (600 mg total) by mouth every evening. 180 tablet 0  . rOPINIRole (REQUIP) 0.25 MG tablet TAKE ONE TO TWO TABLETS BY  MOUTH AT BEDTIME 60 tablet 0  . rosuvastatin (CRESTOR) 40 MG tablet Take 1 tablet (40 mg total) by mouth daily. 90 tablet 1  . senna (SENOKOT) 8.6 MG TABS tablet Take 2 tablets (17.2 mg total) by mouth 2 (two) times daily. 30 each 0  . SUMAtriptan (IMITREX) 50 MG tablet TAKE ONE TABLET EVERY 2 HOURS AS NEEDED FOR MIGRAINE. MAY REPEAT IN 2 HOURS IF HEADACHE PERSISTS OR RECURS. (MAX OF 2 TABLETS IN 24 HOURS) 10 tablet 0  . venlafaxine XR (EFFEXOR XR) 75 MG 24 hr capsule Take 1 capsule (75 mg total) by mouth daily with breakfast. 90 capsule 1  . warfarin (COUMADIN) 5 MG tablet Take as directed by coumadin clinic 90 tablet 0   No current facility-administered medications for this visit.  Allergies as of 11/22/2018 - Review Complete 11/22/2018  Allergen Reaction Noted  . Mucinex [guaifenesin er] Other (See Comments) 06/05/2016  . Keflex [cephalexin] Other (See Comments) 01/21/2016  . Sulfonamide derivatives Other (See Comments) 06/11/2010    Family History  Problem Relation Age of Onset  . Scoliosis Mother   . Arthritis Mother        Rheumatoid  . Aneurysm Mother        heart  . Alcohol abuse Father   . Depression Sister   . Depression Brother   . Alcohol abuse Brother   . Cancer Maternal Grandmother        colon  . Diabetes Maternal Grandfather   . Heart disease Maternal Grandfather   . Alcohol abuse Maternal Grandfather   . Depression Paternal Grandmother   . Diabetes Paternal Grandmother   . Depression Paternal Grandfather   . Diabetes Paternal Grandfather   . Depression Sister   . Alcohol abuse Brother   . Depression Brother   . Heart disease Brother     Social History   Socioeconomic History  . Marital status: Married    Spouse name: Not on file  . Number of children: Not on file  . Years of education: Not on file  . Highest education level: Not on file  Occupational History  . Not on file  Social Needs  . Financial resource strain: Not on file  . Food  insecurity    Worry: Not on file    Inability: Not on file  . Transportation needs    Medical: Not on file    Non-medical: Not on file  Tobacco Use  . Smoking status: Former Smoker    Quit date: 05/26/1990    Years since quitting: 28.5  . Smokeless tobacco: Never Used  Substance and Sexual Activity  . Alcohol use: Yes    Alcohol/week: 0.0 standard drinks    Comment: rare  . Drug use: No  . Sexual activity: Not Currently  Lifestyle  . Physical activity    Days per week: Not on file    Minutes per session: Not on file  . Stress: Not on file  Relationships  . Social Herbalist on phone: Not on file    Gets together: Not on file    Attends religious service: Not on file    Active member of club or organization: Not on file    Attends meetings of clubs or organizations: Not on file    Relationship status: Not on file  . Intimate partner violence    Fear of current or ex partner: Not on file    Emotionally abused: Not on file    Physically abused: Not on file    Forced sexual activity: Not on file  Other Topics Concern  . Not on file  Social History Narrative  . Not on file     Physical Exam: Unable to perform because this was a "telemed visit" due to current Covid-19 pandemic  Assessment and plan: 68 y.o. female with personal history of adenomatous polyps, chronic loose stools, blood thinner use for mechanical aortic valve  I recommended a colonoscopy at her soonest convenience.  At the same time as looking for recurrent polyps I evaluate for causes of chronic loose stools, likely look in her terminal ileum and also proceed with biopsies to check for microscopic colitis.  She understands that she is at increased risk for bleeding during a colonoscopy given her ongoing blood thinner  use for her mechanical valve.  I recommended that it is safest that she stop her Coumadin for 5 days prior to the colonoscopy.  We will communicate with the Coumadin clinic about this.   She has had Lovenox bridge in previous circumstances such as this and I imagine that they want to do the same again.  I see no reason for any further blood tests or imaging studies prior to then.  Please see the "Patient Instructions" section for addition details about the plan.  Owens Loffler, MD [facility] 11/22/2018, 2:01 PM

## 2018-11-22 NOTE — Telephone Encounter (Signed)
Patient with diagnosis of afib and mechanical AVR on warfarin for anticoagulation.    Procedure: colonoscopy Date of procedure: 12/22/2018  CrCl 82ml/min Platelet count 254  Per office protocol, patient can hold warfarin for 5 days prior to procedure.   Patient WILL need bridging with Lovenox (enoxaparin) around procedure.  Patient has appointment with coumadin clinic on 7/20- will coordinate bridge at this appointment

## 2018-11-23 NOTE — Telephone Encounter (Signed)
Patient has been notified and voiced understanding.

## 2018-11-23 NOTE — Telephone Encounter (Signed)
   Primary Cardiologist: Sinclair Grooms, MD  Chart reviewed as part of pre-operative protocol coverage. She was last seen on 09/16/17 by Dr. Tamala Julian. Her annual follow up appt has been delayed due to pandemic. I called the patient and she is doing well, can complete more than 4.0 METS without anginal symptoms, and has no history of CAD or MI.  Requesting office has asked for recommendations regarding bridging coumadin.    Per our pharmacy staff; Patient with diagnosis of afib and mechanical AVR on warfarin for anticoagulation.    Procedure: colonoscopy Date of procedure: 12/22/2018  CrCl 92ml/min Platelet count 254  Per office protocol, patient can hold warfarin for 5 days prior to procedure.   Patient WILL need bridging with Lovenox (enoxaparin) around procedure.  Patient has appointment with coumadin clinic on 7/20- will coordinate bridge at this appointment   I will route this recommendation to the requesting party via [place] fax function and remove from pre-op pool.  Please call with questions.  Tami Lin Irelynn Schermerhorn, PA 11/23/2018, 3:11 PM

## 2018-12-13 ENCOUNTER — Telehealth: Payer: Self-pay

## 2018-12-13 NOTE — Telephone Encounter (Signed)

## 2018-12-13 NOTE — Progress Notes (Signed)
Cardiology Office Note:    Date:  12/14/2018   ID:  [name], DOB 08-14-1950, MRN [id number]  PCP:  Mosie Lukes, MD  Cardiologist:  Sinclair Grooms, MD   Referring MD: Mosie Lukes, MD   Chief Complaint  Patient presents with  . Coronary Artery Disease  . Atrial Fibrillation  . Cardiac Valve Problem    History of Present Illness:    [name] is a 68 y.o. female with a hx of status post aortic valve replacement with a mechanical prosthesis 2011, history of recurrent atrial fibrillation, hyperlipidemia, diabetes mellitus type 2, and chronic anticoagulation therapy with Coumadin. Chronic diastolic HF.  She does not have dyspnea, orthopnea, PND, palpitations, transient neurological symptoms, angina, or syncope/near syncope.  She has not noticed edema.  She is relatively sedentary.  She is under stress due to both to a move into her daughter's house and also her husband is dying at age 56.  She has had some difficulty with blood in her stool and has a colonoscopy planned.  She denies chills, fever, and decreased appetite  Past Medical History:  Diagnosis Date  . ANEMIA 05/28/2010  . AORTIC VALVE REPLACEMENT, HX OF 03/11/2010   Mechanical prosthesis  . BIPOLAR AFFECTIVE DISORDER 03/11/2010  . Depression   . Diverticulitis 12/18/2010  . FIBROIDS, UTERUS 03/11/2010  . Gout 09/04/2016  . Grave's disease 8-12  . Hyperlipidemia associated with type 2 diabetes mellitus ([place]) 06/05/2016  . Hypertension   . Low back pain 11/02/2017  . Migraine 06/05/2016  . Mixed hyperlipidemia 03/11/2010  . Obesity   . UTI (lower urinary tract infection) 08/14/2011    Past Surgical History:  Procedure Laterality Date  . ABDOMINAL HYSTERECTOMY  ?1998   sb/l spo, total for fibroids/heavy bleeding  . ANKLE SURGERY Left 01/2018   hardware removal  . AORTIC VALVE REPLACEMENT  2011   Mechanical prosthesis, [employer]  . APPENDECTOMY     Required revision for EColi infection, required  recurrent packing  . bowel obstruction     Requiring adhesions to be removed  . CHOLECYSTECTOMY    . COLONOSCOPY WITH PROPOFOL N/A 11/01/2015   Procedure: COLONOSCOPY WITH PROPOFOL;  Surgeon: Milus Banister, MD;  Location: [facility];  Service: Endoscopy;  Laterality: N/A;  . CYSTOCELE REPAIR N/A 10/16/2015   Procedure: ANTERIOR REPAIR (CYSTOCELE);  Surgeon: Donnamae Jude, MD;  Location: [facility];  Service: Gynecology;  Laterality: N/A;  . HERNIA REPAIR  08-16-10  . LEFT HEART CATHETERIZATION WITH CORONARY ANGIOGRAM N/A 12/05/2011   Procedure: LEFT HEART CATHETERIZATION WITH CORONARY ANGIOGRAM;  Surgeon: Sinclair Grooms, MD;  Location: [facility] CATH LAB;  Service: Cardiovascular;  Laterality: N/A;  . TOTAL KNEE ARTHROPLASTY Right 02/24/2013   Procedure: RIGHT TOTAL KNEE ARTHROPLASTY;  Surgeon: Johnn Hai, MD;  Location: [facility];  Service: Orthopedics;  Laterality: Right;  . TUBAL LIGATION    . varicose vein surgery b/l legs      Current Medications: Current Meds  Medication Sig  . acetaminophen (TYLENOL) 500 MG tablet Take 1,000-1,500 mg by mouth every 6 (six) hours as needed (For knee pain.).  Marland Kitchen amLODipine (NORVASC) 5 MG tablet TAKE ONE TABLET BY MOUTH DAILY  . aspirin EC 81 MG tablet Take 81 mg by mouth daily.  . clonazePAM (KLONOPIN) 0.5 MG tablet Take 1 tablet (0.5 mg total) by mouth at bedtime.  . fenofibrate micronized (ANTARA) 130 MG capsule Take 1 capsule (130 mg total) by mouth  daily before breakfast.  . furosemide (LASIX) 40 MG tablet TAKE ONE TABLET BY MOUTH DAILY *PLEASE KEEP UPCOMING APPOINTMENT*  . glucose blood (ONETOUCH VERIO) test strip Check blood sugars once daily Dx: E11.9  . lamoTRIgine (LAMICTAL) 100 MG tablet Take 100 mg by mouth in the morning and 200 mg by mouth at bedtime  . Lancets (ONETOUCH ULTRASOFT) lancets Use as instructed to test blood sugars once daily Dx. E11.65  . metFORMIN (GLUCOPHAGE XR) 500 MG 24 hr tablet Take 2 tablets (1,000 mg total) by mouth 2 (two)  times daily with a meal.  . metoprolol succinate (TOPROL-XL) 25 MG 24 hr tablet TAKE ONE TABLET BY MOUTH EVERY MORNING  . ONE TOUCH LANCETS MISC Test once daily to check blood sugar. DX E11.9  . OXcarbazepine (TRILEPTAL) 150 MG tablet Take 1 tablet (150 mg total) by mouth 2 (two) times daily.  . pantoprazole (PROTONIX) 40 MG tablet Take 1 tablet (40 mg total) by mouth daily.  Vladimir Faster Glycol-Propyl Glycol (SYSTANE OP) Place 1 drop into both eyes at bedtime as needed (For dry eyes.).   Marland Kitchen Probiotic Product (ALIGN) 4 MG CAPS Take 4 mg by mouth daily.  . QUEtiapine (SEROQUEL XR) 300 MG 24 hr tablet Take 2 tablets (600 mg total) by mouth every evening.  Marland Kitchen rOPINIRole (REQUIP) 0.25 MG tablet TAKE ONE TO TWO TABLETS BY MOUTH AT BEDTIME  . rosuvastatin (CRESTOR) 40 MG tablet Take 1 tablet (40 mg total) by mouth daily.  . SUMAtriptan (IMITREX) 50 MG tablet TAKE ONE TABLET EVERY 2 HOURS AS NEEDED FOR MIGRAINE. MAY REPEAT IN 2 HOURS IF HEADACHE PERSISTS OR RECURS. (MAX OF 2 TABLETS IN 24 HOURS)  . venlafaxine XR (EFFEXOR XR) 75 MG 24 hr capsule Take 1 capsule (75 mg total) by mouth daily with breakfast.  . warfarin (COUMADIN) 5 MG tablet Take as directed by coumadin clinic     Allergies:   Mucinex [guaifenesin er], Keflex [cephalexin], and Sulfonamide derivatives   Social History   Socioeconomic History  . Marital status: Married    Spouse name: Not on file  . Number of children: Not on file  . Years of education: Not on file  . Highest education level: Not on file  Occupational History  . Not on file  Social Needs  . Financial resource strain: Not on file  . Food insecurity    Worry: Not on file    Inability: Not on file  . Transportation needs    Medical: Not on file    Non-medical: Not on file  Tobacco Use  . Smoking status: Former Smoker    Quit date: 05/26/1990    Years since quitting: 28.5  . Smokeless tobacco: Never Used  Substance and Sexual Activity  . Alcohol use: Yes     Alcohol/week: 0.0 standard drinks    Comment: rare  . Drug use: No  . Sexual activity: Not Currently  Lifestyle  . Physical activity    Days per week: Not on file    Minutes per session: Not on file  . Stress: Not on file  Relationships  . Social Herbalist on phone: Not on file    Gets together: Not on file    Attends religious service: Not on file    Active member of club or organization: Not on file    Attends meetings of clubs or organizations: Not on file    Relationship status: Not on file  Other Topics Concern  .  Not on file  Social History Narrative  . Not on file     Family History: The patient's family history includes Alcohol abuse in her brother, brother, father, and maternal grandfather; Aneurysm in her mother; Arthritis in her mother; Cancer in her maternal grandmother; Depression in her brother, brother, paternal grandfather, paternal grandmother, sister, and sister; Diabetes in her maternal grandfather, paternal grandfather, and paternal grandmother; Heart disease in her brother and maternal grandfather; Scoliosis in her mother.  ROS:   Please see the history of present illness.    Anxiety and depression related to the illness of her husband who is dying at age 63.  Pain in her calves hips and buttocks associated with low back pain.  Upcoming colonoscopy for melena and blood in her stool.  I encouraged her to keep the appointment despite her husband's terminal condition.  All other systems reviewed and are negative.  EKGs/Labs/Other Studies Reviewed:    The following studies were reviewed today: 2D Doppler echocardiogram January 2017: Study Conclusions  - Left ventricle: The cavity size was normal. Wall thickness was   increased in a pattern of mild LVH. Systolic function was normal.   The estimated ejection fraction was in the range of 55% to 60%.   The study is not technically sufficient to allow evaluation of LV   diastolic function. - Aortic  valve: Poorly visualized. Mechanical bileaflet aortic   valve without obstruction. Trivial regurgitant cleaning jet. - Mitral valve: Calcified annulus. There was trivial regurgitation. - Left atrium: The atrium was normal in size. - Right atrium: The atrium was mildly dilated. - Tricuspid valve: There was trivial regurgitation. - Pulmonary arteries: PA peak pressure: 24 mm Hg (S). - Inferior vena cava: The vessel was normal in size. The   respirophasic diameter changes were in the normal range (>= 50%),   consistent with normal central venous pressure.  Impressions:  - LVEF 55-60%, mild LVH, mechanical aortic valve without   obstruction, normal LA size, mild RAE, trivial TR, normal RVSP.   EKG:  EKG atrial fibrillation with moderate rate control and ventricular response 104 bpm nonspecific ST-T abnormality, poor R wave progression.  When compared to April 2019, heart rate is faster.  Recent Labs: 03/18/2018: ALT 22; Hemoglobin 12.8; Platelets 254.0; TSH 2.46 08/16/2018: BUN 17; Creatinine, Ser 0.97; Potassium 4.3; Sodium 133  Recent Lipid Panel    Component Value Date/Time   CHOL 185 03/18/2018 1202   TRIG (H) 03/18/2018 1202    553.0 Triglyceride is over 400; calculations on Lipids are invalid.   HDL 44.30 03/18/2018 1202   CHOLHDL 4 03/18/2018 1202   VLDL 69.2 (H) 01/29/2017 1521   LDLCALC 71 04/04/2013 1622   LDLDIRECT 79.0 03/18/2018 1202    Physical Exam:    VS:  BP (!) 144/74   Pulse (!) 104   Ht 5\' 8"  (1.727 m)   Wt 242 lb 12.8 oz (110.1 kg)   SpO2 96%   BMI 36.92 kg/m     Wt Readings from Last 3 Encounters:  12/14/18 242 lb 12.8 oz (110.1 kg)  11/22/18 250 lb (113.4 kg)  08/16/18 251 lb 6.4 oz (114 kg)     GEN: Morbidly obese. No acute distress HEENT: Normal NECK: No JVD. LYMPHATICS: No lymphadenopathy CARDIAC: Rapid and irregularly irregular.  2/6 right upper sternal systolic murmur with mechanical valve closure sound.  No gallop, or edema. VASCULAR:   Normal Pulses. No bruits. RESPIRATORY:  Clear to auscultation without rales, wheezing or rhonchi  ABDOMEN: Soft, non-tender, non-distended, No pulsatile mass, MUSCULOSKELETAL: No deformity  SKIN: Warm and dry NEUROLOGIC:  Alert and oriented x 3 PSYCHIATRIC:  Normal affect   ASSESSMENT:    1. Hx of mechanical aortic valve replacement   2. Atrial fibrillation, unspecified type ([place])   3. Essential hypertension, benign   4. Hyperlipidemia associated with type 2 diabetes mellitus ([place])   5. Long term current use of anticoagulant therapy   6. Educated About Covid-19 Virus Infection    PLAN:    In order of problems listed above:  1. 2D Doppler echocardiogram to assess valve and LV function.  This should be done sometime within the next 6 months.  Clinical follow-up in 9 to 12 months. 2. Chronic atrial fibrillation but higher than usual heart rate.  Increase Toprol-XL to 50 mg/day. 3. Increase Toprol-XL to 50 mg/day. 4. Secondary prevention discussed.  LDL target less than 70. 5. Continue anticoagulation therapy with Coumadin. 6. Social distancing, masking, and washing were stressed. 7. Made a special plea with the patient to follow through on her GI work-up.  Her tendency now is to turn all attention to her husband who is dying.  If she is having blood in his stool this needs to be seriously evaluated to help exclude malignancy/all//other reasons for bleeding that could threaten anticoagulation  Overall education and awareness concerning primary/secondary risk prevention was discussed in detail: LDL less than 70, hemoglobin A1c less than 7, blood pressure target less than 130/80 mmHg, >150 minutes of moderate aerobic activity per week, avoidance of smoking, weight control (via diet and exercise), and continued surveillance/management of/for obstructive sleep apnea.    Medication Adjustments/Labs and Tests Ordered: Current medicines are reviewed at length with the patient today.  Concerns  regarding medicines are outlined above.  No orders of the defined types were placed in this encounter.  No orders of the defined types were placed in this encounter.   There are no Patient Instructions on file for this visit.   Signed, Sinclair Grooms, MD  12/14/2018 12:15 PM    [place]

## 2018-12-14 ENCOUNTER — Ambulatory Visit (INDEPENDENT_AMBULATORY_CARE_PROVIDER_SITE_OTHER): Payer: PPO | Admitting: *Deleted

## 2018-12-14 ENCOUNTER — Other Ambulatory Visit: Payer: Self-pay

## 2018-12-14 ENCOUNTER — Ambulatory Visit (INDEPENDENT_AMBULATORY_CARE_PROVIDER_SITE_OTHER): Payer: PPO | Admitting: Interventional Cardiology

## 2018-12-14 ENCOUNTER — Encounter: Payer: Self-pay | Admitting: Interventional Cardiology

## 2018-12-14 VITALS — BP 144/74 | HR 104 | Ht 68.0 in | Wt 242.8 lb

## 2018-12-14 DIAGNOSIS — F3181 Bipolar II disorder: Secondary | ICD-10-CM

## 2018-12-14 DIAGNOSIS — E785 Hyperlipidemia, unspecified: Secondary | ICD-10-CM | POA: Diagnosis not present

## 2018-12-14 DIAGNOSIS — E1169 Type 2 diabetes mellitus with other specified complication: Secondary | ICD-10-CM

## 2018-12-14 DIAGNOSIS — Z7189 Other specified counseling: Secondary | ICD-10-CM | POA: Diagnosis not present

## 2018-12-14 DIAGNOSIS — Z7901 Long term (current) use of anticoagulants: Secondary | ICD-10-CM

## 2018-12-14 DIAGNOSIS — F411 Generalized anxiety disorder: Secondary | ICD-10-CM

## 2018-12-14 DIAGNOSIS — I4891 Unspecified atrial fibrillation: Secondary | ICD-10-CM

## 2018-12-14 DIAGNOSIS — Z5181 Encounter for therapeutic drug level monitoring: Secondary | ICD-10-CM | POA: Diagnosis not present

## 2018-12-14 DIAGNOSIS — Z952 Presence of prosthetic heart valve: Secondary | ICD-10-CM

## 2018-12-14 DIAGNOSIS — I10 Essential (primary) hypertension: Secondary | ICD-10-CM | POA: Diagnosis not present

## 2018-12-14 LAB — POCT INR: INR: 2.8 (ref 2.0–3.0)

## 2018-12-14 MED ORDER — ENOXAPARIN SODIUM 120 MG/0.8ML ~~LOC~~ SOLN: 120.0000 mg | Freq: Two times a day (BID) | SUBCUTANEOUS | 0 refills | Status: DC

## 2018-12-14 MED ORDER — METOPROLOL SUCCINATE ER 50 MG PO TB24: 50.0000 mg | ORAL_TABLET | Freq: Every day | ORAL | 3 refills | Status: DC

## 2018-12-14 MED ORDER — QUETIAPINE FUMARATE ER 300 MG PO TB24: 600.0000 mg | ORAL_TABLET | Freq: Every evening | ORAL | 0 refills | Status: DC

## 2018-12-14 MED ORDER — OXCARBAZEPINE 150 MG PO TABS: 150.0000 mg | ORAL_TABLET | Freq: Two times a day (BID) | ORAL | 0 refills | Status: DC

## 2018-12-14 NOTE — Patient Instructions (Signed)
Medication Instructions:  1) INCREASE Metoprolol Succinate to 50mg  once daily  If you need a refill on your cardiac medications before your next appointment, please call your pharmacy.   Lab work: None If you have labs (blood work) drawn today and your tests are completely normal, you will receive your results only by: Marland Kitchen MyChart Message (if you have MyChart) OR . A paper copy in the mail If you have any lab test that is abnormal or we need to change your treatment, we will call you to review the results.  Testing/Procedures: Your physician has requested that you have an echocardiogram. Echocardiography is a painless test that uses sound waves to create images of your heart. It provides your doctor with information about the size and shape of your heart and how well your heart's chambers and valves are working. This procedure takes approximately one hour. There are no restrictions for this procedure.    Follow-Up: At [facility], you and your health needs are our priority.  As part of our continuing mission to provide you with exceptional heart care, we have created designated Provider Care Teams.  These Care Teams include your primary Cardiologist (physician) and Advanced Practice Providers (APPs -  Physician Assistants and Nurse Practitioners) who all work together to provide you with the care you need, when you need it. You will need a follow up appointment in 9-12 months.  Please call our office 2 months in advance to schedule this appointment.  You may see Sinclair Grooms, MD or one of the following Advanced Practice Providers on your designated Care Team:   Truitt Merle, NP Cecilie Kicks, NP . Kathyrn Drown, NP  Any Other Special Instructions Will Be Listed Below (If Applicable).

## 2018-12-14 NOTE — Patient Instructions (Addendum)
7/23: Last dose of Coumadin.  7/24: No Coumadin or Lovenox.  7/25: Inject Lovenox 120 mg in the fatty abdominal tissue at least 2 inches from the belly button twice a day about 12 hours apart, 8am and 8pm rotate sites. No Coumadin.  7/26: Inject Lovenox in the fatty tissue every 12 hours, 8am and 8pm. No Coumadin.  7/27: Inject Lovenox in the fatty tissue every 12 hours, 8am and 8pm. No Coumadin.  7/28: Inject Lovenox in the fatty tissue in the morning at 8 am (No PM dose). No Coumadin.  7/29: Procedure Day - No Lovenox - Resume Coumadin in the evening or as directed by doctor (take an extra half tablet with usual dose for 2 days then resume normal dose).  7/30: Resume Lovenox inject in the fatty tissue every 12 hours and take Coumadin.  7/31: Inject Lovenox in the fatty tissue every 12 hours and take Coumadin.  8/1: Inject Lovenox in the fatty tissue every 12 hours and take Coumadin.  8/2: Inject Lovenox in the fatty tissue every 12 hours and take Coumadin.  8/3: Inject Lovenox in the fatty tissue every 12 hours and take Coumadin.  8/4: Coumadin appt to check INR.  Please call [phone] (coumadin clinic #)

## 2018-12-21 ENCOUNTER — Encounter (HOSPITAL_COMMUNITY): Payer: Self-pay

## 2018-12-21 ENCOUNTER — Other Ambulatory Visit: Payer: Self-pay

## 2018-12-21 ENCOUNTER — Telehealth: Payer: Self-pay | Admitting: Gastroenterology

## 2018-12-21 ENCOUNTER — Emergency Department (HOSPITAL_COMMUNITY): Payer: PPO

## 2018-12-21 ENCOUNTER — Telehealth: Payer: Self-pay | Admitting: Interventional Cardiology

## 2018-12-21 ENCOUNTER — Emergency Department (HOSPITAL_COMMUNITY)
Admission: EM | Admit: 2018-12-21 | Discharge: 2018-12-21 | Disposition: A | Discharge status: Home / Self Care | Payer: PPO | Attending: Emergency Medicine | Admitting: Emergency Medicine

## 2018-12-21 DIAGNOSIS — W208XXA Other cause of strike by thrown, projected or falling object, initial encounter: Secondary | ICD-10-CM | POA: Insufficient documentation

## 2018-12-21 DIAGNOSIS — S9031XA Contusion of right foot, initial encounter: Secondary | ICD-10-CM | POA: Diagnosis not present

## 2018-12-21 DIAGNOSIS — Y999 Unspecified external cause status: Secondary | ICD-10-CM | POA: Diagnosis not present

## 2018-12-21 DIAGNOSIS — I10 Essential (primary) hypertension: Secondary | ICD-10-CM | POA: Diagnosis not present

## 2018-12-21 DIAGNOSIS — Z79899 Other long term (current) drug therapy: Secondary | ICD-10-CM | POA: Diagnosis not present

## 2018-12-21 DIAGNOSIS — F319 Bipolar disorder, unspecified: Secondary | ICD-10-CM | POA: Diagnosis not present

## 2018-12-21 DIAGNOSIS — Z7982 Long term (current) use of aspirin: Secondary | ICD-10-CM | POA: Diagnosis not present

## 2018-12-21 DIAGNOSIS — Y939 Activity, unspecified: Secondary | ICD-10-CM | POA: Diagnosis not present

## 2018-12-21 DIAGNOSIS — R531 Weakness: Secondary | ICD-10-CM | POA: Insufficient documentation

## 2018-12-21 DIAGNOSIS — Z87891 Personal history of nicotine dependence: Secondary | ICD-10-CM | POA: Insufficient documentation

## 2018-12-21 DIAGNOSIS — S91312A Laceration without foreign body, left foot, initial encounter: Secondary | ICD-10-CM | POA: Diagnosis not present

## 2018-12-21 DIAGNOSIS — Y929 Unspecified place or not applicable: Secondary | ICD-10-CM | POA: Insufficient documentation

## 2018-12-21 DIAGNOSIS — Z7901 Long term (current) use of anticoagulants: Secondary | ICD-10-CM | POA: Diagnosis not present

## 2018-12-21 DIAGNOSIS — S91311A Laceration without foreign body, right foot, initial encounter: Secondary | ICD-10-CM

## 2018-12-21 DIAGNOSIS — E0500 Thyrotoxicosis with diffuse goiter without thyrotoxic crisis or storm: Secondary | ICD-10-CM | POA: Diagnosis not present

## 2018-12-21 DIAGNOSIS — T1490XA Injury, unspecified, initial encounter: Secondary | ICD-10-CM

## 2018-12-21 LAB — URINALYSIS, ROUTINE W REFLEX MICROSCOPIC
Bacteria, UA: NONE SEEN
Bilirubin Urine: NEGATIVE
Glucose, UA: NEGATIVE mg/dL
Hgb urine dipstick: NEGATIVE
Ketones, ur: NEGATIVE mg/dL
Nitrite: NEGATIVE
Protein, ur: 100 mg/dL — AB
Specific Gravity, Urine: 1.028 (ref 1.005–1.030)
pH: 5 (ref 5.0–8.0)

## 2018-12-21 LAB — CBC WITH DIFFERENTIAL/PLATELET
Abs Immature Granulocytes: 0.01 10*3/uL (ref 0.00–0.07)
Basophils Absolute: 0.1 10*3/uL (ref 0.0–0.1)
Basophils Relative: 1 %
Eosinophils Absolute: 0.1 10*3/uL (ref 0.0–0.5)
Eosinophils Relative: 2 %
HCT: 36.9 % (ref 36.0–46.0)
Hemoglobin: 12 g/dL (ref 12.0–15.0)
Immature Granulocytes: 0 %
Lymphocytes Relative: 19 %
Lymphs Abs: 1.4 10*3/uL (ref 0.7–4.0)
MCH: 29.5 pg (ref 26.0–34.0)
MCHC: 32.5 g/dL (ref 30.0–36.0)
MCV: 90.7 fL (ref 80.0–100.0)
Monocytes Absolute: 0.5 10*3/uL (ref 0.1–1.0)
Monocytes Relative: 7 %
Neutro Abs: 5.2 10*3/uL (ref 1.7–7.7)
Neutrophils Relative %: 71 %
Platelets: 223 10*3/uL (ref 150–400)
RBC: 4.07 MIL/uL (ref 3.87–5.11)
RDW: 13.8 % (ref 11.5–15.5)
WBC: 7.3 10*3/uL (ref 4.0–10.5)
nRBC: 0 % (ref 0.0–0.2)

## 2018-12-21 LAB — COMPREHENSIVE METABOLIC PANEL
ALT: 22 U/L (ref 0–44)
AST: 24 U/L (ref 15–41)
Albumin: 4.2 g/dL (ref 3.5–5.0)
Alkaline Phosphatase: 53 U/L (ref 38–126)
Anion gap: 12 (ref 5–15)
BUN: 10 mg/dL (ref 8–23)
CO2: 25 mmol/L (ref 22–32)
Calcium: 9.3 mg/dL (ref 8.9–10.3)
Chloride: 102 mmol/L (ref 98–111)
Creatinine, Ser: 1.05 mg/dL — ABNORMAL HIGH (ref 0.44–1.00)
GFR calc Af Amer: >60 mL/min (ref >60)
GFR calc non Af Amer: 55 mL/min — ABNORMAL LOW (ref >60)
Glucose, Bld: 157 mg/dL — ABNORMAL HIGH (ref 70–99)
Potassium: 3.8 mmol/L (ref 3.5–5.1)
Sodium: 139 mmol/L (ref 135–145)
Total Bilirubin: 0.9 mg/dL (ref 0.3–1.2)
Total Protein: 7.1 g/dL (ref 6.5–8.1)

## 2018-12-21 MED ORDER — SODIUM CHLORIDE 0.9 % IV BOLUS: 1000.0000 mL | Freq: Once | INTRAVENOUS | Status: DC

## 2018-12-21 NOTE — Discharge Instructions (Addendum)
Return here as needed.  Follow-up with your doctor.  Keep the area clean and dry.  Observe for any signs of infection.

## 2018-12-21 NOTE — Telephone Encounter (Signed)
Spoke with patient's daughter on speaker phone with patient in background.  Patient holding Lovenox b/c of laceration and excessive bleeding.  She is waiting to hear from Cardiologist whether or not she  should hold Lovenox today.    Advised that Cardiologist needs to make determination regarding Lovenox.  Advised patient that she still has time to take Dulcolax and begin prep as instructed at 5 pm and still be on schedule.

## 2018-12-21 NOTE — Telephone Encounter (Signed)
Great, thanks

## 2018-12-21 NOTE — Telephone Encounter (Signed)
Encounter not needed

## 2018-12-21 NOTE — ED Notes (Signed)
Pt verbalized understanding of d/c instructions and has no further questions, VSS, nAD. No bleeding at any point since the pt has been here. GIven coban, ace bandage and non adhesive bandage.

## 2018-12-21 NOTE — Telephone Encounter (Signed)
Patient daughter amy called said that the patient was admitted to the hospital this morning for a laceration of the right foot and is behind on the prep for the colonoscopy tomorrow. She would like to know if she will still be able to keep her appt.

## 2018-12-21 NOTE — ED Provider Notes (Signed)
[facility] EMERGENCY DEPARTMENT Provider Note   CSN: [id number] Arrival date & time: 12/21/18  0908     History   Chief Complaint Chief Complaint  Patient presents with  . Extremity Laceration    HPI [name] is a 68 y.o. female.     HPI Patient presents to the emergency department with injury to her right foot.  The patient dropped a can on her foot and caused a small laceration.  Patient is on Coumadin and last night she went to bed with a pressure dressing on it and woke up this morning with a lot of blood around the bed.  Patient states it upset her this morning and she was feeling weak after seeing this.  Patient states that nothing seems to make the condition better or worse.  The patient is feeling fine at this time.  The patient denies chest pain, shortness of breath, headache,blurred vision, neck pain, fever, cough,numbness, dizziness, anorexia, edema, abdominal pain, nausea, vomiting, diarrhea, rash, back pain, dysuria, hematemesis, bloody stool, near syncope, or syncope. Past Medical History:  Diagnosis Date  . ANEMIA 05/28/2010  . AORTIC VALVE REPLACEMENT, HX OF 03/11/2010   Mechanical prosthesis  . BIPOLAR AFFECTIVE DISORDER 03/11/2010  . Depression   . Diverticulitis 12/18/2010  . FIBROIDS, UTERUS 03/11/2010  . Gout 09/04/2016  . Grave's disease 8-12  . Hyperlipidemia associated with type 2 diabetes mellitus ([place]) 06/05/2016  . Hypertension   . Low back pain 11/02/2017  . Migraine 06/05/2016  . Mixed hyperlipidemia 03/11/2010  . Obesity   . UTI (lower urinary tract infection) 08/14/2011    Patient Active Problem List   Diagnosis Date Noted  . GAD (generalized anxiety disorder) 04/25/2018  . Ankle pain 02/04/2018  . Nocturia 11/02/2017  . Low back pain 11/02/2017  . Type 2 diabetes mellitus with diabetic polyneuropathy, without long-term current use of insulin ([place]) 11/02/2017  . Hyperlipidemia associated with type 2 diabetes mellitus  ([place]) 06/05/2016  . Migraine 06/05/2016  . Encounter for therapeutic drug monitoring 09/13/2013  . Essential hypertension, benign 04/05/2013  . Atrial fibrillation ([place]) 03/18/2013  . Long term current use of anticoagulant therapy 03/11/2013  . Urinary tract infection 02/10/2013  . Pernicious anemia 02/21/2011  . Renal insufficiency 12/30/2010  . Hyperthyroidism 12/18/2010  . Bipolar disorder ([place]) 03/11/2010  . DIVERTICULOSIS OF COLON 03/11/2010  . Constipation 03/11/2010  . Hx of mechanical aortic valve replacement 03/11/2010    Past Surgical History:  Procedure Laterality Date  . ABDOMINAL HYSTERECTOMY  ?1998   sb/l spo, total for fibroids/heavy bleeding  . ANKLE SURGERY Left 01/2018   hardware removal  . AORTIC VALVE REPLACEMENT  2011   Mechanical prosthesis, [employer]  . APPENDECTOMY     Required revision for EColi infection, required recurrent packing  . bowel obstruction     Requiring adhesions to be removed  . CHOLECYSTECTOMY    . COLONOSCOPY WITH PROPOFOL N/A 11/01/2015   Procedure: COLONOSCOPY WITH PROPOFOL;  Surgeon: Milus Banister, MD;  Location: [facility];  Service: Endoscopy;  Laterality: N/A;  . CYSTOCELE REPAIR N/A 10/16/2015   Procedure: ANTERIOR REPAIR (CYSTOCELE);  Surgeon: Donnamae Jude, MD;  Location: [facility];  Service: Gynecology;  Laterality: N/A;  . HERNIA REPAIR  08-16-10  . LEFT HEART CATHETERIZATION WITH CORONARY ANGIOGRAM N/A 12/05/2011   Procedure: LEFT HEART CATHETERIZATION WITH CORONARY ANGIOGRAM;  Surgeon: Sinclair Grooms, MD;  Location: [facility] CATH LAB;  Service: Cardiovascular;  Laterality: N/A;  .  TOTAL KNEE ARTHROPLASTY Right 02/24/2013   Procedure: RIGHT TOTAL KNEE ARTHROPLASTY;  Surgeon: Johnn Hai, MD;  Location: [facility];  Service: Orthopedics;  Laterality: Right;  . TUBAL LIGATION    . varicose vein surgery b/l legs       OB History    Gravida  2   Para  2   Term  2   Preterm      AB      Living  2     SAB      TAB       Ectopic      Multiple      Live Births               Home Medications    Prior to Admission medications   Medication Sig Start Date End Date Taking? Authorizing Provider  acetaminophen (TYLENOL) 500 MG tablet Take 1,000-1,500 mg by mouth every 6 (six) hours as needed (For knee pain.).   Yes [provider]  amLODipine (NORVASC) 5 MG tablet TAKE ONE TABLET BY MOUTH DAILY 09/21/18  Yes Belva Crome, MD  aspirin EC 81 MG tablet Take 81 mg by mouth daily.   Yes [provider]  clonazePAM (KLONOPIN) 0.5 MG tablet Take 1 tablet (0.5 mg total) by mouth at bedtime. 08/27/18 12/21/18 Yes Thayer Headings, PMHNP  enoxaparin (LOVENOX) 120 MG/0.8ML injection Inject 0.8 mLs (120 mg total) into the skin every 12 (twelve) hours. As directed by coumadin clinic 12/14/18  Yes Belva Crome, MD  fenofibrate micronized (ANTARA) 130 MG capsule Take 1 capsule (130 mg total) by mouth daily before breakfast. 03/19/18  Yes Mosie Lukes, MD  furosemide (LASIX) 40 MG tablet TAKE ONE TABLET BY MOUTH DAILY *PLEASE KEEP UPCOMING APPOINTMENT* 09/27/18  Yes Belva Crome, MD  lamoTRIgine (LAMICTAL) 100 MG tablet Take 100 mg by mouth in the morning and 200 mg by mouth at bedtime 08/27/18  Yes Thayer Headings, PMHNP  metFORMIN (GLUCOPHAGE XR) 500 MG 24 hr tablet Take 2 tablets (1,000 mg total) by mouth 2 (two) times daily with a meal. 08/27/18  Yes Shamleffer, Melanie Crazier, MD  metoprolol succinate (TOPROL-XL) 50 MG 24 hr tablet Take 1 tablet (50 mg total) by mouth daily. Take with or immediately following a meal. 12/14/18 03/14/19 Yes Belva Crome, MD  OXcarbazepine (TRILEPTAL) 150 MG tablet Take 1 tablet (150 mg total) by mouth 2 (two) times daily. 12/14/18 03/14/19 Yes Thayer Headings, PMHNP  pantoprazole (PROTONIX) 40 MG tablet Take 1 tablet (40 mg total) by mouth daily. 08/27/18  Yes Mosie Lukes, MD  Polyethyl Glycol-Propyl Glycol (SYSTANE OP) Place 1 drop into both eyes at bedtime as needed (For  dry eyes.).    Yes [provider]  QUEtiapine (SEROQUEL XR) 300 MG 24 hr tablet Take 2 tablets (600 mg total) by mouth every evening. 12/14/18 03/14/19 Yes Thayer Headings, PMHNP  rOPINIRole (REQUIP) 0.25 MG tablet TAKE ONE TO TWO TABLETS BY MOUTH AT BEDTIME Patient taking differently: Take 0.5 mg by mouth at bedtime.  09/14/18  Yes Mosie Lukes, MD  rosuvastatin (CRESTOR) 40 MG tablet Take 1 tablet (40 mg total) by mouth daily. 08/27/18  Yes Mosie Lukes, MD  SUMAtriptan (IMITREX) 50 MG tablet TAKE ONE TABLET EVERY 2 HOURS AS NEEDED FOR MIGRAINE. MAY REPEAT IN 2 HOURS IF HEADACHE PERSISTS OR RECURS. (MAX OF 2 TABLETS IN 24 HOURS) Patient taking differently: Take 50 mg by mouth every 2 (two) hours as  needed for migraine or headache.  12/01/17  Yes Mosie Lukes, MD  venlafaxine XR (EFFEXOR XR) 75 MG 24 hr capsule Take 1 capsule (75 mg total) by mouth daily with breakfast. 08/27/18 12/21/18 Yes Thayer Headings, PMHNP  warfarin (COUMADIN) 5 MG tablet Take as directed by coumadin clinic Patient taking differently: Take 2.5-5 mg by mouth See admin instructions. Take 1/2 tablet (2.5mg ) Sun, Mon, Wed, and Fri. Take 1 tablet (5mg ) on Tue, Thus, Sat 08/27/18  Yes Belva Crome, MD  glucose blood [facility] VERIO) test strip Check blood sugars once daily Dx: E11.9 10/12/18   Mosie Lukes, MD  Lancets [facility] ULTRASOFT) lancets Use as instructed to test blood sugars once daily Dx. E11.65 08/18/18   Shamleffer, Melanie Crazier, MD  ONE TOUCH LANCETS MISC Test once daily to check blood sugar. DX E11.9 04/26/15   Mosie Lukes, MD  methimazole (TAPAZOLE) 5 MG tablet Take 1 tablet (5 mg total) by mouth 3 (three) times daily. 06/24/11 08/05/11  Renato Shin, MD    Family History Family History  Problem Relation Age of Onset  . Scoliosis Mother   . Arthritis Mother        Rheumatoid  . Aneurysm Mother        heart  . Alcohol abuse Father   . Depression Sister   . Depression Brother   . Alcohol  abuse Brother   . Cancer Maternal Grandmother        colon  . Diabetes Maternal Grandfather   . Heart disease Maternal Grandfather   . Alcohol abuse Maternal Grandfather   . Depression Paternal Grandmother   . Diabetes Paternal Grandmother   . Depression Paternal Grandfather   . Diabetes Paternal Grandfather   . Depression Sister   . Alcohol abuse Brother   . Depression Brother   . Heart disease Brother     Social History Social History   Tobacco Use  . Smoking status: Former Smoker    Quit date: 05/26/1990    Years since quitting: 28.5  . Smokeless tobacco: Never Used  Substance Use Topics  . Alcohol use: Yes    Alcohol/week: 0.0 standard drinks    Comment: rare  . Drug use: No     Allergies   Mucinex [guaifenesin er], Keflex [cephalexin], and Sulfonamide derivatives   Review of Systems Review of Systems All other systems negative except as documented in the HPI. All pertinent positives and negatives as reviewed in the HPI.  Physical Exam Updated Vital Signs BP 129/64   Pulse (!) 50   Temp 98.2 F (36.8 C) (Oral)   Resp 18   SpO2 96%   Physical Exam Vitals signs and nursing note reviewed.  Constitutional:      General: She is not in acute distress.    Appearance: She is well-developed.  HENT:     Head: Normocephalic and atraumatic.  Eyes:     Pupils: Pupils are equal, round, and reactive to light.  Neck:     Musculoskeletal: Normal range of motion and neck supple.  Cardiovascular:     Rate and Rhythm: Normal rate and regular rhythm.     Heart sounds: Normal heart sounds. No murmur. No friction rub. No gallop.   Pulmonary:     Effort: Pulmonary effort is normal. No respiratory distress.     Breath sounds: Normal breath sounds. No wheezing.  Abdominal:     General: Bowel sounds are normal. There is no distension.     Palpations:  Abdomen is soft.     Tenderness: There is no abdominal tenderness.  Musculoskeletal:       Feet:  Skin:    General:  Skin is warm and dry.     Capillary Refill: Capillary refill takes less than 2 seconds.     Findings: No erythema or rash.  Neurological:     Mental Status: She is alert and oriented to person, place, and time.     Motor: No abnormal muscle tone.     Coordination: Coordination normal.  Psychiatric:        Behavior: Behavior normal.      ED Treatments / Results  Labs (all labs ordered are listed, but only abnormal results are displayed) Labs Reviewed  COMPREHENSIVE METABOLIC PANEL - Abnormal; Notable for the following components:      Result Value   Glucose, Bld 157 (*)    Creatinine, Ser 1.05 (*)    GFR calc non Af Amer 55 (*)    All other components within normal limits  URINALYSIS, ROUTINE W REFLEX MICROSCOPIC - Abnormal; Notable for the following components:   Color, Urine AMBER (*)    APPearance HAZY (*)    Protein, ur 100 (*)    Leukocytes,Ua TRACE (*)    All other components within normal limits  CBC WITH DIFFERENTIAL/PLATELET    EKG None  Radiology Dg Foot Complete Left  Result Date: 12/21/2018 CLINICAL DATA:  Injury.  Laceration and bleeding. EXAM: LEFT FOOT - COMPLETE 3+ VIEW COMPARISON:  02/04/2018. FINDINGS: Soft tissue bandage scratched it bandage noted over the medial aspect of the distal left foot. No acute bony abnormality identified. No evidence of fracture or dislocation. No radiopaque foreign body. IMPRESSION: No acute bony abnormality.  No radiopaque foreign body. Electronically Signed   By: Marcello Moores  Register   On: 12/21/2018 10:32    Procedures Procedures (including critical care time)  Medications Ordered in ED Medications - No data to display   Initial Impression / Assessment and Plan / ED Course  I have reviewed the triage vital signs and the nursing notes.  Pertinent labs & imaging results that were available during my care of the patient were reviewed by me and considered in my medical decision making (see chart for details).        Patient's laboratory testing did not yield any significant abnormalities.  I feel that the patient most likely had a vasovagal type reaction this morning.  The patient has not been orthostatic.  Patient has been stable here in the emergency department.  She is feeling no symptoms at this time.  The laceration is unable to be repaired due to the fact that it has been over 12 hours.  Final Clinical Impressions(s) / ED Diagnoses   Final diagnoses:  Injury    ED Discharge Orders    None       Dalia Heading, PA-C 12/21/18 1307    Veryl Speak, MD 12/22/18 [phone]

## 2018-12-21 NOTE — Telephone Encounter (Signed)
This patient appears to be on Dr. Ardis Hughs schedule for colonoscopy tomorrow.  I have copied this to him.

## 2018-12-21 NOTE — Telephone Encounter (Signed)
Spoke with patient, she is in the ED because she dropped a can on her toe and is bleeding. They are currently doing x-rays and she will call back to give an update once she is discharged   Covid-19 Screening Questions:  Do you now or have you had a fever in the last 14 days? no  Do you have any respiratory symptoms of shortness of breath or cough now or in the last 14 days? no  Do you have any family members or close contacts with diagnosed or suspected Covid-19 in the past 14 days? no  Have you been tested for Covid-19 and found to be positive? no  Pt made aware of that care partner may wait in the car or come up to the lobby during the procedure but will need to provide their own mask.

## 2018-12-21 NOTE — ED Triage Notes (Signed)
Pt endorses dropping a can onto her right foot around the great toe area yesterday around 1500 causing a large laceration/avulsion. Pressure dressing was placed by daughter who is an Therapist, sports. Pt continued to have bleeding through the evening night and this morning. BP 87/55 in triage and pt feels more weak than normal. Axox4. On coumadin.

## 2018-12-22 ENCOUNTER — Ambulatory Visit (AMBULATORY_SURGERY_CENTER): Payer: PPO | Admitting: Gastroenterology

## 2018-12-22 ENCOUNTER — Encounter: Payer: Self-pay | Admitting: Gastroenterology

## 2018-12-22 VITALS — BP 154/68 | HR 55 | Temp 98.4°F | Resp 19 | Ht 68.0 in | Wt 250.0 lb

## 2018-12-22 DIAGNOSIS — K635 Polyp of colon: Secondary | ICD-10-CM | POA: Diagnosis not present

## 2018-12-22 DIAGNOSIS — D120 Benign neoplasm of cecum: Secondary | ICD-10-CM

## 2018-12-22 DIAGNOSIS — Z86010 Personal history of colonic polyps: Secondary | ICD-10-CM | POA: Diagnosis not present

## 2018-12-22 DIAGNOSIS — R197 Diarrhea, unspecified: Secondary | ICD-10-CM | POA: Diagnosis not present

## 2018-12-22 MED ORDER — SODIUM CHLORIDE 0.9 % IV SOLN: 500.0000 mL | Freq: Once | INTRAVENOUS | Status: DC

## 2018-12-22 NOTE — Progress Notes (Signed)
Called to room to assist during endoscopic procedure.  Patient ID and intended procedure confirmed with present staff. Received instructions for my participation in the procedure from the performing physician.  

## 2018-12-22 NOTE — Progress Notes (Signed)
Pt's states no medical or surgical changes since previsit or office visit. 

## 2018-12-22 NOTE — Op Note (Addendum)
[name] Patient Name: [name] Procedure Date: 12/22/2018 1:18 PM MRN: [id number] Endoscopist: Milus Banister , MD Age: 68 Referring MD:  Date of Birth: February 07, 1951 Gender: Female Account #: [id number] Procedure:                Colonoscopy Indications:              High risk colon cancer surveillance: Personal                            history of colonic polyps; Personal history of                            adenomatous polyps : colonoscopy June 2017; 4                            subcentimeter adenomas were removed, diverticulosis                            was noted throughout the colon. She was recommended                            to have repeat colonoscopy at 3-year interval. Also                            chronic loose stools. Medicines:                Monitored Anesthesia Care Procedure:                Pre-Anesthesia Assessment:                           - Prior to the procedure, a History and Physical                            was performed, and patient medications and                            allergies were reviewed. The patient's tolerance of                            previous anesthesia was also reviewed. The risks                            and benefits of the procedure and the sedation                            options and risks were discussed with the patient.                            All questions were answered, and informed consent                            was obtained. Prior Anticoagulants: The patient has  taken Coumadin (warfarin), last dose was 5 days                            prior to procedure. ASA Grade Assessment: III - A                            patient with severe systemic disease. After                            reviewing the risks and benefits, the patient was                            deemed in satisfactory condition to undergo the                            procedure.  After obtaining informed consent, the colonoscope                            was passed under direct vision. Throughout the                            procedure, the patient's blood pressure, pulse, and                            oxygen saturations were monitored continuously. The                            Colonoscope was introduced through the anus and                            advanced to the the cecum, identified by                            appendiceal orifice and ileocecal valve. The                            colonoscopy was performed without difficulty. The                            patient tolerated the procedure well. The quality                            of the bowel preparation was good. The ileocecal                            valve, appendiceal orifice, and rectum were                            photographed. Scope In: 1:27:51 PM Scope Out: 1:41:15 PM Scope Withdrawal Time: 0 hours 9 minutes 18 seconds  Total Procedure Duration: 0 hours 13 minutes 24 seconds  Findings:                 A 6 mm polyp was found in the cecum.  The polyp was                            sessile. The polyp was removed with a cold snare.                            Resection and retrieval were complete.                           Multiple small-mouthed diverticula were found in                            the left colon.                           Biopsies for histology were taken with a cold                            forceps from the entire colon for evaluation of                            microscopic colitis.                           The exam was otherwise without abnormality on                            direct and retroflexion views. Complications:            No immediate complications. Estimated blood loss:                            None. Estimated Blood Loss:     Estimated blood loss: none. Impression:               - One 6 mm polyp in the cecum, removed with a cold                             snare. Resected and retrieved.                           - Diverticulosis in the left colon.                           - The examination was otherwise normal on direct                            and retroflexion views.                           - Biopsies were taken with a cold forceps from the                            entire colon for evaluation of microscopic colitis. Recommendation:           - Patient has a contact number available for  emergencies. The signs and symptoms of potential                            delayed complications were discussed with the                            patient. Return to normal activities tomorrow.                            Written discharge instructions were provided to the                            patient.                           - Resume previous diet.                           - Repeat colonoscopy is recommended. The                            colonoscopy date will be determined after pathology                            results from today's exam become available for                            review.                           - You can resume coumadin today. Milus Banister, MD 12/22/2018 1:51:05 PM This report has been signed electronically.

## 2018-12-22 NOTE — Patient Instructions (Signed)
Handout on polyps, diverticulosis. Resume Coumadin today.   YOU HAD AN ENDOSCOPIC PROCEDURE TODAY AT [facility]:   Refer to the procedure report that was given to you for any specific questions about what was found during the examination.  If the procedure report does not answer your questions, please call your gastroenterologist to clarify.  If you requested that your care partner not be given the details of your procedure findings, then the procedure report has been included in a sealed envelope for you to review at your convenience later.  YOU SHOULD EXPECT: Some feelings of bloating in the abdomen. Passage of more gas than usual.  Walking can help get rid of the air that was put into your GI tract during the procedure and reduce the bloating. If you had a lower endoscopy (such as a colonoscopy or flexible sigmoidoscopy) you may notice spotting of blood in your stool or on the toilet paper. If you underwent a bowel prep for your procedure, you may not have a normal bowel movement for a few days.  Please Note:  You might notice some irritation and congestion in your nose or some drainage.  This is from the oxygen used during your procedure.  There is no need for concern and it should clear up in a day or so.  SYMPTOMS TO REPORT IMMEDIATELY:   Following lower endoscopy (colonoscopy or flexible sigmoidoscopy):  Excessive amounts of blood in the stool  Significant tenderness or worsening of abdominal pains  Swelling of the abdomen that is new, acute  Fever of 100F or higher   For urgent or emergent issues, a gastroenterologist can be reached at any hour by calling [phone].   DIET:  We do recommend a small meal at first, but then you may proceed to your regular diet.  Drink plenty of fluids but you should avoid alcoholic beverages for 24 hours.  ACTIVITY:  You should plan to take it easy for the rest of today and you should NOT DRIVE or use heavy machinery until  tomorrow (because of the sedation medicines used during the test).    FOLLOW UP: Our staff will call the number listed on your records 48-72 hours following your procedure to check on you and address any questions or concerns that you may have regarding the information given to you following your procedure. If we do not reach you, we will leave a message.  We will attempt to reach you two times.  During this call, we will ask if you have developed any symptoms of COVID 19. If you develop any symptoms (ie: fever, flu-like symptoms, shortness of breath, cough etc.) before then, please call [phone].  If you test positive for Covid 19 in the 2 weeks post procedure, please call and report this information to [place].    If any biopsies were taken you will be contacted by phone or by letter within the next 1-3 weeks.  Please call us at [phone] if you have not heard about the biopsies in 3 weeks.    SIGNATURES/CONFIDENTIALITY: You and/or your care partner have signed paperwork which will be entered into your electronic medical record.  These signatures attest to the fact that that the information above on your After Visit Summary has been reviewed and is understood.  Full responsibility of the confidentiality of this discharge information lies with you and/or your care-partner.

## 2018-12-22 NOTE — Progress Notes (Signed)
Pt Drowsy. VSS. To PACU, report to RN. No anesthetic complications noted.  

## 2018-12-24 ENCOUNTER — Telehealth: Payer: Self-pay

## 2018-12-24 NOTE — Telephone Encounter (Signed)
  Follow up Call-  Call back number 12/22/2018  Post procedure Call Back phone  # [phone]  Permission to leave phone message Yes  Some recent data might be hidden     Patient questions:  Do you have a fever, pain , or abdominal swelling? No. Pain Score  0 *  Have you tolerated food without any problems? Yes.    Have you been able to return to your normal activities? Yes.    Do you have any questions about your discharge instructions: Diet   No. Medications  No. Follow up visit  No.  Do you have questions or concerns about your Care? No.  Actions: * If pain score is 4 or above: No action needed, pain <4.  1. Have you developed a fever since your procedure? no  2.   Have you had an respiratory symptoms (SOB or cough) since your procedure? no  3.   Have you tested positive for COVID 19 since your procedure no  4.   Have you had any family members/close contacts diagnosed with the COVID 19 since your procedure?  No   If yes to any of these questions please route to Joylene John, RN and Alphonsa Gin, RN.

## 2018-12-27 ENCOUNTER — Other Ambulatory Visit: Payer: Self-pay

## 2018-12-27 DIAGNOSIS — F411 Generalized anxiety disorder: Secondary | ICD-10-CM

## 2018-12-27 DIAGNOSIS — F5105 Insomnia due to other mental disorder: Secondary | ICD-10-CM

## 2018-12-27 DIAGNOSIS — F99 Mental disorder, not otherwise specified: Secondary | ICD-10-CM

## 2018-12-27 MED ORDER — CLONAZEPAM 0.5 MG PO TABS: 0.5000 mg | ORAL_TABLET | Freq: Every day | ORAL | 0 refills | Status: DC

## 2018-12-28 ENCOUNTER — Ambulatory Visit (HOSPITAL_COMMUNITY): Payer: PPO | Attending: Cardiovascular Disease

## 2018-12-28 ENCOUNTER — Other Ambulatory Visit: Payer: Self-pay

## 2018-12-28 ENCOUNTER — Ambulatory Visit (INDEPENDENT_AMBULATORY_CARE_PROVIDER_SITE_OTHER): Payer: PPO

## 2018-12-28 DIAGNOSIS — I4891 Unspecified atrial fibrillation: Secondary | ICD-10-CM

## 2018-12-28 DIAGNOSIS — Z5181 Encounter for therapeutic drug level monitoring: Secondary | ICD-10-CM | POA: Diagnosis not present

## 2018-12-28 DIAGNOSIS — Z952 Presence of prosthetic heart valve: Secondary | ICD-10-CM | POA: Diagnosis not present

## 2018-12-28 LAB — POCT INR: INR: 1.5 — AB (ref 2.0–3.0)

## 2018-12-28 MED ORDER — ENOXAPARIN SODIUM 120 MG/0.8ML ~~LOC~~ SOLN: 120.0000 mg | Freq: Two times a day (BID) | SUBCUTANEOUS | 0 refills | Status: DC

## 2018-12-28 NOTE — Patient Instructions (Addendum)
Description   Take 1.5 tablets today, then 1 tablet tomorrow, then resume same dosage 1/2 tablet daily except 1 tablet on Tuesdays, Thursdays, and Saturdays. Continue on Lovenox 120mg  twice daily. Recheck INR  On Friday.

## 2018-12-29 ENCOUNTER — Encounter: Payer: Self-pay | Admitting: Gastroenterology

## 2018-12-30 DIAGNOSIS — L570 Actinic keratosis: Secondary | ICD-10-CM | POA: Diagnosis not present

## 2018-12-30 DIAGNOSIS — D485 Neoplasm of uncertain behavior of skin: Secondary | ICD-10-CM | POA: Diagnosis not present

## 2018-12-30 DIAGNOSIS — L814 Other melanin hyperpigmentation: Secondary | ICD-10-CM | POA: Diagnosis not present

## 2018-12-30 DIAGNOSIS — D692 Other nonthrombocytopenic purpura: Secondary | ICD-10-CM | POA: Diagnosis not present

## 2018-12-30 DIAGNOSIS — L821 Other seborrheic keratosis: Secondary | ICD-10-CM | POA: Diagnosis not present

## 2018-12-30 DIAGNOSIS — Z85828 Personal history of other malignant neoplasm of skin: Secondary | ICD-10-CM | POA: Diagnosis not present

## 2018-12-31 ENCOUNTER — Ambulatory Visit (INDEPENDENT_AMBULATORY_CARE_PROVIDER_SITE_OTHER): Payer: PPO | Admitting: *Deleted

## 2018-12-31 ENCOUNTER — Other Ambulatory Visit: Payer: Self-pay

## 2018-12-31 DIAGNOSIS — I4891 Unspecified atrial fibrillation: Secondary | ICD-10-CM

## 2018-12-31 DIAGNOSIS — Z5181 Encounter for therapeutic drug level monitoring: Secondary | ICD-10-CM

## 2018-12-31 DIAGNOSIS — Z952 Presence of prosthetic heart valve: Secondary | ICD-10-CM | POA: Diagnosis not present

## 2018-12-31 LAB — POCT INR: INR: 3.2 — AB (ref 2.0–3.0)

## 2018-12-31 NOTE — Patient Instructions (Signed)
Description   Stop Lovenox injections. Resume your normal of 1/2 tablet daily except 1 tablet on Tuesdays, Thursdays, and Saturdays. Recheck INR in 1 week. Coumadin Clinic [phone]

## 2019-01-04 ENCOUNTER — Ambulatory Visit (INDEPENDENT_AMBULATORY_CARE_PROVIDER_SITE_OTHER): Payer: PPO | Admitting: Psychiatry

## 2019-01-04 ENCOUNTER — Other Ambulatory Visit: Payer: Self-pay

## 2019-01-04 ENCOUNTER — Encounter: Payer: Self-pay | Admitting: Psychiatry

## 2019-01-04 DIAGNOSIS — F5105 Insomnia due to other mental disorder: Secondary | ICD-10-CM

## 2019-01-04 DIAGNOSIS — F3181 Bipolar II disorder: Secondary | ICD-10-CM

## 2019-01-04 DIAGNOSIS — F411 Generalized anxiety disorder: Secondary | ICD-10-CM | POA: Diagnosis not present

## 2019-01-04 DIAGNOSIS — F99 Mental disorder, not otherwise specified: Secondary | ICD-10-CM | POA: Diagnosis not present

## 2019-01-04 MED ORDER — OXCARBAZEPINE 150 MG PO TABS: 150.0000 mg | ORAL_TABLET | Freq: Two times a day (BID) | ORAL | 0 refills | Status: DC

## 2019-01-04 MED ORDER — LAMOTRIGINE 100 MG PO TABS: ORAL_TABLET | ORAL | 1 refills | Status: DC

## 2019-01-04 MED ORDER — QUETIAPINE FUMARATE ER 300 MG PO TB24: 600.0000 mg | ORAL_TABLET | Freq: Every evening | ORAL | 0 refills | Status: DC

## 2019-01-04 MED ORDER — QUETIAPINE FUMARATE 100 MG PO TABS: ORAL_TABLET | ORAL | 2 refills | Status: DC

## 2019-01-04 MED ORDER — VENLAFAXINE HCL ER 75 MG PO CP24: 75.0000 mg | ORAL_CAPSULE | Freq: Every day | ORAL | 1 refills | Status: DC

## 2019-01-04 MED ORDER — CLONAZEPAM 0.5 MG PO TABS: 0.5000 mg | ORAL_TABLET | Freq: Every day | ORAL | 0 refills | Status: DC

## 2019-01-04 NOTE — Progress Notes (Signed)
[name] [id number] 1951/04/11 68 y.o.  Subjective:   Patient ID:  [name] is a 67 y.o. (DOB May 10, 1951) female.  Chief Complaint:  Chief Complaint  Patient presents with  . Insomnia  . Follow-up    h/o mood instability and anxiety    HPI HALENA MOHAR presents to the office today for follow-up of mood disturbance, anxiety, and insomnia. Husband died 3 weeks ago. She reports that the day husband was transported to hospice was the day they had to move out of their apartment. Reports children helped her move and she is now living with her daughter and her family and plans to stay there long-term.   She reports that she has been anticipating the death of her husband and "is not grief stricken." She reports that she occasionally will become teary eyed. She reports, "my mood has been good." Denies significant depression. She reports that she has been having some difficulty with insomnia prior to husband's death. She reports frequent middle of the night awakenings. Reports difficulty falling asleep and that it can take at least 2 hours to fall asleep. She reports that some nights she cannot fall asleep until 5 am and then awakens at 8 am. Estimates sleeping no more than 5 hours a night. She reports that she then feels tired and sleepy the remainder of the day. Denies any recent restless legs. She reports that energy is generally lower. Reports some increased physical activity with moving in with daughter and no longer sitting with husband. Reports that her motivation is somewhat low. She reports that she has been experiencing some anxious thoughts and worry, typically related to her deceased husband. Denies panic s/s. She reports that she has lost some weight with healthier eating since living with her daughter. Reports that appetite has been ok. She reports poor concentration and focus. Reports difficulty recalling some information. Denies SI.  Denies any risky or impulsive behaviors. Denies  elevated mood.   Reports that she is sending husband's children and grandchildren photos and mementos.   Past medication trials: Seroquel Seroquel XR Lamictal Trileptal Effexor XR Lexapro BuSpar-felt tired Trazodone  Review of Systems:  Review of Systems  Musculoskeletal: Positive for gait problem.       Reports that she dropped a can of cleaner on her toe and had excessive bleeding. Reports that she was seen in ER and was told it was not fractured.  Skin:       Reports that she saw dermatologist and had lesions removed on her face that were determined to be pre-cancerous.   Neurological: Negative for tremors.  Psychiatric/Behavioral:       Please refer to HPI    Medications: I have reviewed the patient's current medications.  Current Outpatient Medications  Medication Sig Dispense Refill  . acetaminophen (TYLENOL) 500 MG tablet Take 1,000-1,500 mg by mouth every 6 (six) hours as needed (For knee pain.).    Marland Kitchen amLODipine (NORVASC) 5 MG tablet TAKE ONE TABLET BY MOUTH DAILY 90 tablet 1  . aspirin EC 81 MG tablet Take 81 mg by mouth daily.    Derrill Memo ON 03/27/2019] clonazePAM (KLONOPIN) 0.5 MG tablet Take 1 tablet (0.5 mg total) by mouth at bedtime. 90 tablet 0  . fenofibrate micronized (ANTARA) 130 MG capsule Take 1 capsule (130 mg total) by mouth daily before breakfast. 30 capsule 3  . furosemide (LASIX) 40 MG tablet TAKE ONE TABLET BY MOUTH DAILY *PLEASE KEEP UPCOMING APPOINTMENT* 90 tablet 1  .  lamoTRIgine (LAMICTAL) 100 MG tablet Take 100 mg by mouth in the morning and 200 mg by mouth at bedtime 270 tablet 1  . metoprolol succinate (TOPROL-XL) 50 MG 24 hr tablet Take 1 tablet (50 mg total) by mouth daily. Take with or immediately following a meal. 90 tablet 3  . OXcarbazepine (TRILEPTAL) 150 MG tablet Take 1 tablet (150 mg total) by mouth 2 (two) times daily. 180 tablet 0  . pantoprazole (PROTONIX) 40 MG tablet Take 1 tablet (40 mg total) by mouth daily. 90 tablet 3  .  QUEtiapine (SEROQUEL XR) 300 MG 24 hr tablet Take 2 tablets (600 mg total) by mouth every evening. 180 tablet 0  . rOPINIRole (REQUIP) 0.25 MG tablet TAKE ONE TO TWO TABLETS BY MOUTH AT BEDTIME (Patient taking differently: Take 0.5 mg by mouth at bedtime. ) 60 tablet 0  . rosuvastatin (CRESTOR) 40 MG tablet Take 1 tablet (40 mg total) by mouth daily. 90 tablet 1  . SUMAtriptan (IMITREX) 50 MG tablet TAKE ONE TABLET EVERY 2 HOURS AS NEEDED FOR MIGRAINE. MAY REPEAT IN 2 HOURS IF HEADACHE PERSISTS OR RECURS. (MAX OF 2 TABLETS IN 24 HOURS) (Patient taking differently: Take 50 mg by mouth every 2 (two) hours as needed for migraine or headache. ) 10 tablet 0  . warfarin (COUMADIN) 5 MG tablet Take as directed by coumadin clinic (Patient taking differently: Take 2.5-5 mg by mouth See admin instructions. Take 1/2 tablet (2.5mg ) Sun, Mon, Wed, and Fri. Take 1 tablet (5mg ) on Tue, Thus, Sat) 90 tablet 0  . enoxaparin (LOVENOX) 120 MG/0.8ML injection Inject 0.8 mLs (120 mg total) into the skin every 12 (twelve) hours. As directed by coumadin clinic (Patient not taking: Reported on 01/04/2019) 8 mL 0  . glucose blood (ONETOUCH VERIO) test strip Check blood sugars once daily Dx: E11.9 100 each 12  . Lancets (ONETOUCH ULTRASOFT) lancets Use as instructed to test blood sugars once daily Dx. E11.65 100 each 12  . metFORMIN (GLUCOPHAGE XR) 500 MG 24 hr tablet Take 2 tablets (1,000 mg total) by mouth 2 (two) times daily with a meal. 360 tablet 2  . ONE TOUCH LANCETS MISC Test once daily to check blood sugar. DX E11.9 100 each 3  . QUEtiapine (SEROQUEL) 100 MG tablet Take 1/2-1 tab po QHS 30 tablet 2  . venlafaxine XR (EFFEXOR XR) 75 MG 24 hr capsule Take 1 capsule (75 mg total) by mouth daily with breakfast. 90 capsule 1   No current facility-administered medications for this visit.     Medication Side Effects: None  Allergies:  Allergies  Allergen Reactions  . Mucinex [Guaifenesin Er] Other (See Comments)     Severe headaches   . Keflex [Cephalexin] Other (See Comments)    Headaches and dizziness  . Sulfonamide Derivatives Other (See Comments)    Headaches     Past Medical History:  Diagnosis Date  . ANEMIA 05/28/2010  . AORTIC VALVE REPLACEMENT, HX OF 03/11/2010   Mechanical prosthesis  . BIPOLAR AFFECTIVE DISORDER 03/11/2010  . Depression   . Diverticulitis 12/18/2010  . FIBROIDS, UTERUS 03/11/2010  . Gout 09/04/2016  . Grave's disease 8-12  . Hyperlipidemia associated with type 2 diabetes mellitus ([place]) 06/05/2016  . Hypertension   . Low back pain 11/02/2017  . Migraine 06/05/2016  . Mixed hyperlipidemia 03/11/2010  . Obesity   . Skin cancer   . UTI (lower urinary tract infection) 08/14/2011    Family History  Problem Relation Age of Onset  .  Scoliosis Mother   . Arthritis Mother        Rheumatoid  . Aneurysm Mother        heart  . Alcohol abuse Father   . Depression Sister   . Depression Brother   . Alcohol abuse Brother   . Cancer Maternal Grandmother        colon  . Diabetes Maternal Grandfather   . Heart disease Maternal Grandfather   . Alcohol abuse Maternal Grandfather   . Depression Paternal Grandmother   . Diabetes Paternal Grandmother   . Depression Paternal Grandfather   . Diabetes Paternal Grandfather   . Depression Sister   . Alcohol abuse Brother   . Depression Brother   . Heart disease Brother     Social History   Socioeconomic History  . Marital status: Married    Spouse name: Not on file  . Number of children: Not on file  . Years of education: Not on file  . Highest education level: Not on file  Occupational History  . Not on file  Social Needs  . Financial resource strain: Not on file  . Food insecurity    Worry: Not on file    Inability: Not on file  . Transportation needs    Medical: Not on file    Non-medical: Not on file  Tobacco Use  . Smoking status: Former Smoker    Quit date: 05/26/1990    Years since quitting: 28.6  .  Smokeless tobacco: Never Used  Substance and Sexual Activity  . Alcohol use: Yes    Alcohol/week: 0.0 standard drinks    Comment: rare  . Drug use: No  . Sexual activity: Not Currently  Lifestyle  . Physical activity    Days per week: Not on file    Minutes per session: Not on file  . Stress: Not on file  Relationships  . Social Herbalist on phone: Not on file    Gets together: Not on file    Attends religious service: Not on file    Active member of club or organization: Not on file    Attends meetings of clubs or organizations: Not on file    Relationship status: Not on file  . Intimate partner violence    Fear of current or ex partner: Not on file    Emotionally abused: Not on file    Physically abused: Not on file    Forced sexual activity: Not on file  Other Topics Concern  . Not on file  Social History Narrative  . Not on file    Past Medical History, Surgical history, Social history, and Family history were reviewed and updated as appropriate.   Please see review of systems for further details on the patient's review from today.   Objective:   Physical Exam:  There were no vitals taken for this visit.  Physical Exam Constitutional:      General: She is not in acute distress.    Appearance: She is well-developed.  Musculoskeletal:        General: No deformity.  Neurological:     Mental Status: She is alert and oriented to person, place, and time.     Coordination: Coordination normal.  Psychiatric:        Attention and Perception: Attention and perception normal. She does not perceive auditory or visual hallucinations.        Mood and Affect: Mood normal. Mood is not anxious or depressed. Affect is  not labile, blunt, angry or inappropriate.        Speech: Speech normal.        Behavior: Behavior normal.        Thought Content: Thought content normal. Thought content does not include homicidal or suicidal ideation. Thought content does not include  homicidal or suicidal plan.        Cognition and Memory: Cognition and memory normal.        Judgment: Judgment normal.     Comments: Insight intact. No delusions.      Lab Review:     Component Value Date/Time   NA 139 12/21/2018 0932   K 3.8 12/21/2018 0932   CL 102 12/21/2018 0932   CO2 25 12/21/2018 0932   GLUCOSE 157 (H) 12/21/2018 0932   BUN 10 12/21/2018 0932   CREATININE 1.05 (H) 12/21/2018 0932   CREATININE 0.89 06/20/2015 1049   CALCIUM 9.3 12/21/2018 0932   PROT 7.1 12/21/2018 0932   ALBUMIN 4.2 12/21/2018 0932   AST 24 12/21/2018 0932   ALT 22 12/21/2018 0932   ALKPHOS 53 12/21/2018 0932   BILITOT 0.9 12/21/2018 0932   GFRNONAA 55 (L) 12/21/2018 0932   GFRAA >60 12/21/2018 0932       Component Value Date/Time   WBC 7.3 12/21/2018 0932   RBC 4.07 12/21/2018 0932   HGB 12.0 12/21/2018 0932   HGB 12.0 06/16/2011 0953   HCT 36.9 12/21/2018 0932   HCT 35.6 06/16/2011 0953   PLT 223 12/21/2018 0932   PLT 233 06/16/2011 0953   MCV 90.7 12/21/2018 0932   MCV 86.6 06/16/2011 0953   MCH 29.5 12/21/2018 0932   MCHC 32.5 12/21/2018 0932   RDW 13.8 12/21/2018 0932   RDW 14.1 06/16/2011 0953   LYMPHSABS 1.4 12/21/2018 0932   LYMPHSABS 1.7 06/16/2011 0953   MONOABS 0.5 12/21/2018 0932   MONOABS 0.3 06/16/2011 0953   EOSABS 0.1 12/21/2018 0932   EOSABS 0.2 06/16/2011 0953   BASOSABS 0.1 12/21/2018 0932   BASOSABS 0.1 06/16/2011 0953    No results found for: POCLITH, LITHIUM   No results found for: PHENYTOIN, PHENOBARB, VALPROATE, CBMZ   .res Assessment: Plan:   Case staffed with Dr. Clovis Pu. Will add Seroquel immediate release 100 mg 1/2 to 1 tablet p.o. nightly to improve insomnia. Advised patient to continue to take Seroquel XR in the evening about 4 hours before bedtime.   Continue Klonopin 0.5 mg at bedtime for anxiety and insomnia. Continue Lamictal 100 mg p.o. every morning and 200 mg p.o. nightly for mood stabilization. Continue Effexor XR 75 mg  p.o. every morning for anxiety depression. Continue Trileptal 150 mg twice daily for anxiety and mood signs and symptoms. Patient to follow-up in 2 months or sooner if clinically indicated. Patient advised to contact office with any questions, adverse effects, or acute worsening in signs and symptoms.  Arlana was seen today for insomnia and follow-up.  Diagnoses and all orders for this visit:  Generalized anxiety disorder -     clonazePAM (KLONOPIN) 0.5 MG tablet; Take 1 tablet (0.5 mg total) by mouth at bedtime. -     OXcarbazepine (TRILEPTAL) 150 MG tablet; Take 1 tablet (150 mg total) by mouth 2 (two) times daily. -     venlafaxine XR (EFFEXOR XR) 75 MG 24 hr capsule; Take 1 capsule (75 mg total) by mouth daily with breakfast.  Insomnia due to other mental disorder -     QUEtiapine (SEROQUEL) 100 MG tablet; Take 1/2-1  tab po QHS -     clonazePAM (KLONOPIN) 0.5 MG tablet; Take 1 tablet (0.5 mg total) by mouth at bedtime.  Bipolar II disorder (HCC) -     QUEtiapine (SEROQUEL) 100 MG tablet; Take 1/2-1 tab po QHS -     lamoTRIgine (LAMICTAL) 100 MG tablet; Take 100 mg by mouth in the morning and 200 mg by mouth at bedtime -     OXcarbazepine (TRILEPTAL) 150 MG tablet; Take 1 tablet (150 mg total) by mouth 2 (two) times daily. -     QUEtiapine (SEROQUEL XR) 300 MG 24 hr tablet; Take 2 tablets (600 mg total) by mouth every evening. -     venlafaxine XR (EFFEXOR XR) 75 MG 24 hr capsule; Take 1 capsule (75 mg total) by mouth daily with breakfast.  Bipolar II disorder (HCC) Comments: Improved with Effexor with some slight worsening since stopping Effexor Orders: -     QUEtiapine (SEROQUEL) 100 MG tablet; Take 1/2-1 tab po QHS -     lamoTRIgine (LAMICTAL) 100 MG tablet; Take 100 mg by mouth in the morning and 200 mg by mouth at bedtime -     OXcarbazepine (TRILEPTAL) 150 MG tablet; Take 1 tablet (150 mg total) by mouth 2 (two) times daily. -     QUEtiapine (SEROQUEL XR) 300 MG 24 hr tablet;  Take 2 tablets (600 mg total) by mouth every evening. -     venlafaxine XR (EFFEXOR XR) 75 MG 24 hr capsule; Take 1 capsule (75 mg total) by mouth daily with breakfast.  Generalized anxiety disorder Comments: Improved with Effexor with some slight worsening since stopping Effexor Orders: -     clonazePAM (KLONOPIN) 0.5 MG tablet; Take 1 tablet (0.5 mg total) by mouth at bedtime. -     OXcarbazepine (TRILEPTAL) 150 MG tablet; Take 1 tablet (150 mg total) by mouth 2 (two) times daily. -     venlafaxine XR (EFFEXOR XR) 75 MG 24 hr capsule; Take 1 capsule (75 mg total) by mouth daily with breakfast.     Please see After Visit Summary for patient specific instructions.  Future Appointments  Date Time Provider [place]  01/06/2019  1:15 PM [id number]  03/04/2019 11:30 AM Thayer Headings, PMHNP [facility] None    No orders of the defined types were placed in this encounter.   -------------------------------

## 2019-01-04 NOTE — Patient Instructions (Signed)
Take Quetiapine (Seroquel) immediate release 100 mg 1/2-1 tab about 30 minutes before bedtime to help with sleep.   Continue taking Seroquel XR 300 mg two tabs about 4 hours before bedtime.

## 2019-01-06 ENCOUNTER — Other Ambulatory Visit: Payer: Self-pay

## 2019-01-06 ENCOUNTER — Telehealth: Payer: Self-pay | Admitting: Psychiatry

## 2019-01-06 ENCOUNTER — Ambulatory Visit (INDEPENDENT_AMBULATORY_CARE_PROVIDER_SITE_OTHER): Payer: PPO | Admitting: Pharmacist

## 2019-01-06 DIAGNOSIS — F3181 Bipolar II disorder: Secondary | ICD-10-CM

## 2019-01-06 DIAGNOSIS — Z952 Presence of prosthetic heart valve: Secondary | ICD-10-CM

## 2019-01-06 DIAGNOSIS — F5105 Insomnia due to other mental disorder: Secondary | ICD-10-CM

## 2019-01-06 DIAGNOSIS — L03116 Cellulitis of left lower limb: Secondary | ICD-10-CM | POA: Diagnosis not present

## 2019-01-06 DIAGNOSIS — Z5181 Encounter for therapeutic drug level monitoring: Secondary | ICD-10-CM | POA: Diagnosis not present

## 2019-01-06 DIAGNOSIS — I4891 Unspecified atrial fibrillation: Secondary | ICD-10-CM | POA: Diagnosis not present

## 2019-01-06 LAB — POCT INR: INR: 4.1 — AB (ref 2.0–3.0)

## 2019-01-06 MED ORDER — QUETIAPINE FUMARATE 100 MG PO TABS: ORAL_TABLET | ORAL | 0 refills | Status: DC

## 2019-01-06 NOTE — Telephone Encounter (Signed)
[facility] called and needs clarification on whether to fill both the seroquel that were sent in or just one . Please call them at 330 [phone]

## 2019-01-06 NOTE — Telephone Encounter (Signed)
Updated to pharmacy

## 2019-01-06 NOTE — Telephone Encounter (Signed)
RTC to number left and it requested to leave a detailed secure message regarding the clarification. Instructed to call back with further concerns or questions

## 2019-01-06 NOTE — Patient Instructions (Signed)
Skip your dose today, then resume your normal of 1/2 tablet daily except 1 tablet on Tuesdays, Thursdays, and Saturdays. Recheck INR in 2 week. [facility]in Clinic [phone]

## 2019-01-06 NOTE — Telephone Encounter (Signed)
Pharmacy called back and said that they need a 90 day supply of 100 mg seroquel so please call that back into the pharmacy [phone]

## 2019-01-07 ENCOUNTER — Ambulatory Visit: Payer: PPO | Admitting: Interventional Cardiology

## 2019-01-13 ENCOUNTER — Ambulatory Visit: Payer: PPO | Admitting: Psychiatry

## 2019-01-21 ENCOUNTER — Other Ambulatory Visit: Payer: Self-pay

## 2019-01-21 ENCOUNTER — Ambulatory Visit (INDEPENDENT_AMBULATORY_CARE_PROVIDER_SITE_OTHER): Payer: PPO | Admitting: *Deleted

## 2019-01-21 DIAGNOSIS — I4891 Unspecified atrial fibrillation: Secondary | ICD-10-CM | POA: Diagnosis not present

## 2019-01-21 DIAGNOSIS — Z952 Presence of prosthetic heart valve: Secondary | ICD-10-CM

## 2019-01-21 DIAGNOSIS — Z5181 Encounter for therapeutic drug level monitoring: Secondary | ICD-10-CM | POA: Diagnosis not present

## 2019-01-21 LAB — POCT INR: INR: 1.9 — AB (ref 2.0–3.0)

## 2019-01-21 NOTE — Patient Instructions (Signed)
Description   Today take 1 tablet then resume your normal of 1/2 tablet daily except 1 tablet on Tuesdays, Thursdays, and Saturdays. Recheck INR in 2 weeks. [facility] [phone]

## 2019-01-31 DIAGNOSIS — M79675 Pain in left toe(s): Secondary | ICD-10-CM | POA: Diagnosis not present

## 2019-02-02 ENCOUNTER — Other Ambulatory Visit: Payer: Self-pay

## 2019-02-02 ENCOUNTER — Ambulatory Visit (INDEPENDENT_AMBULATORY_CARE_PROVIDER_SITE_OTHER): Payer: PPO

## 2019-02-02 DIAGNOSIS — Z952 Presence of prosthetic heart valve: Secondary | ICD-10-CM

## 2019-02-02 DIAGNOSIS — Z5181 Encounter for therapeutic drug level monitoring: Secondary | ICD-10-CM | POA: Diagnosis not present

## 2019-02-02 DIAGNOSIS — I4891 Unspecified atrial fibrillation: Secondary | ICD-10-CM | POA: Diagnosis not present

## 2019-02-02 LAB — POCT INR: INR: 1.8 — AB (ref 2.0–3.0)

## 2019-02-02 NOTE — Patient Instructions (Signed)
Description   Take 1 tablet today, then start taking 1 tablet daily except 1/2 tablet on Mondays Wednesdays and Fridays.  Recheck INR in 2 weeks. [facility]in Clinic [phone]

## 2019-02-03 ENCOUNTER — Telehealth: Payer: Self-pay

## 2019-02-03 NOTE — Telephone Encounter (Signed)
Copied from [place] [id number]. Topic: General - Inquiry >> Feb 02, 2019 11:49 AM [name] wrote: Reason for CRM:   Pt wants to know if she needs a pneumonia shot.  States that she is traveling tomorrow and would need to get the shot from the pharmacy today.  Pt would like a call back as soon as possible.

## 2019-02-03 NOTE — Telephone Encounter (Signed)
Spoke w/ Pt- informed she is up to date on pneumonia vaccinations. Pt verbalized understanding. +

## 2019-02-11 ENCOUNTER — Ambulatory Visit: Payer: PPO | Admitting: Family Medicine

## 2019-02-14 DIAGNOSIS — I4891 Unspecified atrial fibrillation: Secondary | ICD-10-CM | POA: Diagnosis not present

## 2019-02-14 DIAGNOSIS — Z5181 Encounter for therapeutic drug level monitoring: Secondary | ICD-10-CM | POA: Diagnosis not present

## 2019-02-14 LAB — PROTIME-INR: INR: 1.3 — AB (ref 0.9–1.1)

## 2019-02-15 ENCOUNTER — Ambulatory Visit (INDEPENDENT_AMBULATORY_CARE_PROVIDER_SITE_OTHER): Payer: PPO | Admitting: Cardiology

## 2019-02-15 DIAGNOSIS — Z5181 Encounter for therapeutic drug level monitoring: Secondary | ICD-10-CM

## 2019-02-15 DIAGNOSIS — I4891 Unspecified atrial fibrillation: Secondary | ICD-10-CM

## 2019-02-15 NOTE — Patient Instructions (Addendum)
Description   Take an extra 1/2 tablet today and tomorrow,  then start taking 1 tablet daily except 1/2 tablet on Mondays and Wednesday  Recheck INR in 1 week. [facility]in Clinic [phone]

## 2019-02-22 ENCOUNTER — Other Ambulatory Visit: Payer: Self-pay

## 2019-02-22 DIAGNOSIS — F411 Generalized anxiety disorder: Secondary | ICD-10-CM

## 2019-02-22 DIAGNOSIS — F3181 Bipolar II disorder: Secondary | ICD-10-CM

## 2019-02-22 MED ORDER — OXCARBAZEPINE 150 MG PO TABS: 150.0000 mg | ORAL_TABLET | Freq: Two times a day (BID) | ORAL | 0 refills | Status: DC

## 2019-02-22 MED ORDER — QUETIAPINE FUMARATE ER 300 MG PO TB24: 600.0000 mg | ORAL_TABLET | Freq: Every evening | ORAL | 0 refills | Status: DC

## 2019-02-23 DIAGNOSIS — Z5181 Encounter for therapeutic drug level monitoring: Secondary | ICD-10-CM | POA: Diagnosis not present

## 2019-02-23 DIAGNOSIS — I4891 Unspecified atrial fibrillation: Secondary | ICD-10-CM | POA: Diagnosis not present

## 2019-02-23 LAB — PROTIME-INR: INR: 3 — AB (ref 0.9–1.1)

## 2019-02-24 ENCOUNTER — Ambulatory Visit (INDEPENDENT_AMBULATORY_CARE_PROVIDER_SITE_OTHER): Payer: PPO | Admitting: Internal Medicine

## 2019-02-24 DIAGNOSIS — Z5181 Encounter for therapeutic drug level monitoring: Secondary | ICD-10-CM

## 2019-02-24 DIAGNOSIS — Z952 Presence of prosthetic heart valve: Secondary | ICD-10-CM | POA: Diagnosis not present

## 2019-02-24 DIAGNOSIS — I4891 Unspecified atrial fibrillation: Secondary | ICD-10-CM | POA: Diagnosis not present

## 2019-03-01 ENCOUNTER — Other Ambulatory Visit: Payer: Self-pay

## 2019-03-01 ENCOUNTER — Other Ambulatory Visit: Payer: Self-pay | Admitting: Interventional Cardiology

## 2019-03-01 MED ORDER — WARFARIN SODIUM 5 MG PO TABS: 5.0000 mg | ORAL_TABLET | Freq: Every day | ORAL | 0 refills | Status: DC

## 2019-03-02 ENCOUNTER — Telehealth: Payer: Self-pay | Admitting: Interventional Cardiology

## 2019-03-02 DIAGNOSIS — I4891 Unspecified atrial fibrillation: Secondary | ICD-10-CM | POA: Diagnosis not present

## 2019-03-02 DIAGNOSIS — Z5181 Encounter for therapeutic drug level monitoring: Secondary | ICD-10-CM | POA: Diagnosis not present

## 2019-03-02 LAB — PROTIME-INR: INR: 2.3 — AB (ref 0.9–1.1)

## 2019-03-02 NOTE — Telephone Encounter (Signed)
New Message   Arbie Cookey from [facility] is calling in to get consent to fill the warfarin (COUMADIN) 5 MG tablet with the brand rising phrama instead of taro. Please give pharmacy a call back to confirm.

## 2019-03-02 NOTE — Telephone Encounter (Signed)
Okay change in maniufacturer.    Patient overdue for INR check. Please call to set appointment.

## 2019-03-03 ENCOUNTER — Other Ambulatory Visit: Payer: Self-pay | Admitting: Interventional Cardiology

## 2019-03-03 ENCOUNTER — Ambulatory Visit (INDEPENDENT_AMBULATORY_CARE_PROVIDER_SITE_OTHER): Payer: PPO | Admitting: Internal Medicine

## 2019-03-03 DIAGNOSIS — Z952 Presence of prosthetic heart valve: Secondary | ICD-10-CM | POA: Diagnosis not present

## 2019-03-03 DIAGNOSIS — I4891 Unspecified atrial fibrillation: Secondary | ICD-10-CM | POA: Diagnosis not present

## 2019-03-03 DIAGNOSIS — Z5181 Encounter for therapeutic drug level monitoring: Secondary | ICD-10-CM | POA: Diagnosis not present

## 2019-03-03 MED ORDER — AMLODIPINE BESYLATE 5 MG PO TABS: 5.0000 mg | ORAL_TABLET | Freq: Every day | ORAL | 2 refills | Status: DC

## 2019-03-03 NOTE — Telephone Encounter (Signed)
Pt was seen 02/24/19 not overdue not due until 10/21

## 2019-03-03 NOTE — Patient Instructions (Signed)
Description   Spoke with pt and advised pt to take 1.5 tablets today then continue taking 1 tablet daily except 1/2 tablet on Mondays and Wednesdays. Recheck INR in 2 weeks. Warfarin manufacturer will [facility] [phone]

## 2019-03-04 ENCOUNTER — Other Ambulatory Visit: Payer: Self-pay

## 2019-03-04 ENCOUNTER — Ambulatory Visit (INDEPENDENT_AMBULATORY_CARE_PROVIDER_SITE_OTHER): Payer: PPO | Admitting: Psychiatry

## 2019-03-04 ENCOUNTER — Encounter: Payer: Self-pay | Admitting: Psychiatry

## 2019-03-04 DIAGNOSIS — F3181 Bipolar II disorder: Secondary | ICD-10-CM | POA: Diagnosis not present

## 2019-03-04 DIAGNOSIS — F5105 Insomnia due to other mental disorder: Secondary | ICD-10-CM | POA: Diagnosis not present

## 2019-03-04 DIAGNOSIS — F411 Generalized anxiety disorder: Secondary | ICD-10-CM | POA: Diagnosis not present

## 2019-03-04 DIAGNOSIS — F99 Mental disorder, not otherwise specified: Secondary | ICD-10-CM | POA: Diagnosis not present

## 2019-03-04 MED ORDER — ROPINIROLE HCL 0.25 MG PO TABS: 0.2500 mg | ORAL_TABLET | Freq: Every day | ORAL | 3 refills | Status: DC

## 2019-03-04 MED ORDER — QUETIAPINE FUMARATE ER 300 MG PO TB24: 600.0000 mg | ORAL_TABLET | Freq: Every evening | ORAL | 1 refills | Status: DC

## 2019-03-04 MED ORDER — QUETIAPINE FUMARATE 100 MG PO TABS: ORAL_TABLET | ORAL | 1 refills | Status: DC

## 2019-03-04 MED ORDER — OXCARBAZEPINE 150 MG PO TABS: 150.0000 mg | ORAL_TABLET | Freq: Two times a day (BID) | ORAL | 0 refills | Status: DC

## 2019-03-04 MED ORDER — VENLAFAXINE HCL ER 75 MG PO CP24: 75.0000 mg | ORAL_CAPSULE | Freq: Every day | ORAL | 1 refills | Status: DC

## 2019-03-04 MED ORDER — LAMOTRIGINE 100 MG PO TABS: ORAL_TABLET | ORAL | 1 refills | Status: DC

## 2019-03-04 NOTE — Progress Notes (Signed)
[name] [id number] 06/21/1950 68 y.o.  Virtual Visit via Telephone Note  I connected with pt on 03/04/19 at 11:30 AM EDT by telephone and verified that I am speaking with the correct person using two identifiers.   I discussed the limitations, risks, security and privacy concerns of performing an evaluation and management service by telephone and the availability of in person appointments. I also discussed with the patient that there may be a patient responsible charge related to this service. The patient expressed understanding and agreed to proceed.   I discussed the assessment and treatment plan with the patient. The patient was provided an opportunity to ask questions and all were answered. The patient agreed with the plan and demonstrated an understanding of the instructions.   The patient was advised to call back or seek an in-person evaluation if the symptoms worsen or if the condition fails to improve as anticipated.  I provided 20 minutes of non-face-to-face time during this encounter.  The patient was located at home.  The provider was located at [place].   Thayer Headings, PMHNP   Subjective:   Patient ID:  [name] is a 68 y.o. (DOB Dec 04, 1950) female.  Chief Complaint:  Chief Complaint  Patient presents with  . Follow-up    Insomnia, anxiety, h/o Bipolar d/o    HPI [name] presents for follow-up of bipolar d/o, anxiety, and insomnia. Pt reports that she is sleeping better with the addition of Seroquel IR. She is now falling and staying asleep without difficulty. Estimates sleeping about 7 hours a night. She reports that her mood has been good and has worked through grief. Denies risky behavior. Occasional impulsive purchases, such as a few extra sneakers. Anxiety has been manageable. She reports that her appetite has been good. She reports energy has been good and motivation has been ok for most things, but low for walking. Concentration is  adequate. Denies SI.   She reports that she is in [place] helping take care of her mother and may be there through the holidays, depending on how her mother does. She has been helping take care of her mother but has told family that they need to make caregiving arrangements for care overnight since pt needs to sleep. Some family, private duty sitters, and hospice also provide services.   Pt continues to grieve the loss of her husband. She reports that a grandchild made a video of him and pt watches it at least daily and finds comfort in this.   Review of Systems:  Review of Systems  Musculoskeletal: Negative for gait problem.  Neurological:       Occ twitch in hand  Psychiatric/Behavioral:       Please refer to HPI   Had flu shot this year.  Medications: I have reviewed the patient's current medications.  Current Outpatient Medications  Medication Sig Dispense Refill  . acetaminophen (TYLENOL) 500 MG tablet Take 1,000-1,500 mg by mouth every 6 (six) hours as needed (For knee pain.).    Marland Kitchen amLODipine (NORVASC) 5 MG tablet Take 1 tablet (5 mg total) by mouth daily. 90 tablet 2  . aspirin EC 81 MG tablet Take 81 mg by mouth daily.    Derrill Memo ON 03/27/2019] clonazePAM (KLONOPIN) 0.5 MG tablet Take 1 tablet (0.5 mg total) by mouth at bedtime. 90 tablet 0  . enoxaparin (LOVENOX) 120 MG/0.8ML injection Inject 0.8 mLs (120 mg total) into the skin every 12 (twelve) hours. As directed by coumadin  clinic (Patient not taking: Reported on 01/04/2019) 8 mL 0  . fenofibrate micronized (ANTARA) 130 MG capsule Take 1 capsule (130 mg total) by mouth daily before breakfast. 30 capsule 3  . furosemide (LASIX) 40 MG tablet TAKE ONE TABLET BY MOUTH DAILY *PLEASE KEEP UPCOMING APPOINTMENT* 90 tablet 1  . glucose blood (ONETOUCH VERIO) test strip Check blood sugars once daily Dx: E11.9 100 each 12  . lamoTRIgine (LAMICTAL) 100 MG tablet Take 100 mg by mouth in the morning and 200 mg by mouth at bedtime 270 tablet 1  .  Lancets (ONETOUCH ULTRASOFT) lancets Use as instructed to test blood sugars once daily Dx. E11.65 100 each 12  . metFORMIN (GLUCOPHAGE XR) 500 MG 24 hr tablet Take 2 tablets (1,000 mg total) by mouth 2 (two) times daily with a meal. 360 tablet 2  . metoprolol succinate (TOPROL-XL) 50 MG 24 hr tablet Take 1 tablet (50 mg total) by mouth daily. Take with or immediately following a meal. 90 tablet 3  . ONE TOUCH LANCETS MISC Test once daily to check blood sugar. DX E11.9 100 each 3  . OXcarbazepine (TRILEPTAL) 150 MG tablet Take 1 tablet (150 mg total) by mouth 2 (two) times daily. 180 tablet 0  . pantoprazole (PROTONIX) 40 MG tablet Take 1 tablet (40 mg total) by mouth daily. 90 tablet 3  . QUEtiapine (SEROQUEL XR) 300 MG 24 hr tablet Take 2 tablets (600 mg total) by mouth every evening. 180 tablet 1  . QUEtiapine (SEROQUEL) 100 MG tablet Take 1/2-1 tab po QHS 90 tablet 1  . rOPINIRole (REQUIP) 0.25 MG tablet Take 1-2 tablets (0.25-0.5 mg total) by mouth at bedtime. 60 tablet 3  . rosuvastatin (CRESTOR) 40 MG tablet Take 1 tablet (40 mg total) by mouth daily. 90 tablet 1  . SUMAtriptan (IMITREX) 50 MG tablet TAKE ONE TABLET EVERY 2 HOURS AS NEEDED FOR MIGRAINE. MAY REPEAT IN 2 HOURS IF HEADACHE PERSISTS OR RECURS. (MAX OF 2 TABLETS IN 24 HOURS) (Patient taking differently: Take 50 mg by mouth every 2 (two) hours as needed for migraine or headache. ) 10 tablet 0  . traMADol (ULTRAM) 50 MG tablet Take by mouth every 6 (six) hours as needed.    . venlafaxine XR (EFFEXOR XR) 75 MG 24 hr capsule Take 1 capsule (75 mg total) by mouth daily with breakfast. 90 capsule 1  . warfarin (COUMADIN) 5 MG tablet Take 1 tablet (5 mg total) by mouth daily. 90 tablet 0   No current facility-administered medications for this visit.     Medication Side Effects: None  Allergies:  Allergies  Allergen Reactions  . Mucinex [Guaifenesin Er] Other (See Comments)    Severe headaches   . Keflex [Cephalexin] Other (See  Comments)    Headaches and dizziness  . Sulfonamide Derivatives Other (See Comments)    Headaches     Past Medical History:  Diagnosis Date  . ANEMIA 05/28/2010  . AORTIC VALVE REPLACEMENT, HX OF 03/11/2010   Mechanical prosthesis  . BIPOLAR AFFECTIVE DISORDER 03/11/2010  . Depression   . Diverticulitis 12/18/2010  . FIBROIDS, UTERUS 03/11/2010  . Gout 09/04/2016  . Grave's disease 8-12  . Hyperlipidemia associated with type 2 diabetes mellitus ([place]) 06/05/2016  . Hypertension   . Low back pain 11/02/2017  . Migraine 06/05/2016  . Mixed hyperlipidemia 03/11/2010  . Obesity   . Skin cancer   . UTI (lower urinary tract infection) 08/14/2011    Family History  Problem Relation Age  of Onset  . Scoliosis Mother   . Arthritis Mother        Rheumatoid  . Aneurysm Mother        heart  . Alcohol abuse Father   . Depression Sister   . Depression Brother   . Alcohol abuse Brother   . Cancer Maternal Grandmother        colon  . Diabetes Maternal Grandfather   . Heart disease Maternal Grandfather   . Alcohol abuse Maternal Grandfather   . Depression Paternal Grandmother   . Diabetes Paternal Grandmother   . Depression Paternal Grandfather   . Diabetes Paternal Grandfather   . Depression Sister   . Alcohol abuse Brother   . Depression Brother   . Heart disease Brother     Social History   Socioeconomic History  . Marital status: Married    Spouse name: Not on file  . Number of children: Not on file  . Years of education: Not on file  . Highest education level: Not on file  Occupational History  . Not on file  Social Needs  . Financial resource strain: Not on file  . Food insecurity    Worry: Not on file    Inability: Not on file  . Transportation needs    Medical: Not on file    Non-medical: Not on file  Tobacco Use  . Smoking status: Former Smoker    Quit date: 05/26/1990    Years since quitting: 28.7  . Smokeless tobacco: Never Used  Substance and Sexual  Activity  . Alcohol use: Yes    Alcohol/week: 0.0 standard drinks    Comment: rare  . Drug use: No  . Sexual activity: Not Currently  Lifestyle  . Physical activity    Days per week: Not on file    Minutes per session: Not on file  . Stress: Not on file  Relationships  . Social Herbalist on phone: Not on file    Gets together: Not on file    Attends religious service: Not on file    Active member of club or organization: Not on file    Attends meetings of clubs or organizations: Not on file    Relationship status: Not on file  . Intimate partner violence    Fear of current or ex partner: Not on file    Emotionally abused: Not on file    Physically abused: Not on file    Forced sexual activity: Not on file  Other Topics Concern  . Not on file  Social History Narrative  . Not on file    Past Medical History, Surgical history, Social history, and Family history were reviewed and updated as appropriate.   Please see review of systems for further details on the patient's review from today.   Objective:   Physical Exam:  There were no vitals taken for this visit.  Physical Exam Neurological:     Mental Status: She is alert and oriented to person, place, and time.     Cranial Nerves: No dysarthria.  Psychiatric:        Attention and Perception: Attention normal.        Mood and Affect: Mood normal.        Speech: Speech normal.        Behavior: Behavior is cooperative.        Thought Content: Thought content normal. Thought content is not paranoid or delusional. Thought content does not include homicidal  or suicidal ideation. Thought content does not include homicidal or suicidal plan.        Cognition and Memory: Cognition and memory normal.        Judgment: Judgment normal.     Lab Review:     Component Value Date/Time   NA 139 12/21/2018 0932   K 3.8 12/21/2018 0932   CL 102 12/21/2018 0932   CO2 25 12/21/2018 0932   GLUCOSE 157 (H) 12/21/2018 0932    BUN 10 12/21/2018 0932   CREATININE 1.05 (H) 12/21/2018 0932   CREATININE 0.89 06/20/2015 1049   CALCIUM 9.3 12/21/2018 0932   PROT 7.1 12/21/2018 0932   ALBUMIN 4.2 12/21/2018 0932   AST 24 12/21/2018 0932   ALT 22 12/21/2018 0932   ALKPHOS 53 12/21/2018 0932   BILITOT 0.9 12/21/2018 0932   GFRNONAA 55 (L) 12/21/2018 0932   GFRAA >60 12/21/2018 0932       Component Value Date/Time   WBC 7.3 12/21/2018 0932   RBC 4.07 12/21/2018 0932   HGB 12.0 12/21/2018 0932   HGB 12.0 06/16/2011 0953   HCT 36.9 12/21/2018 0932   HCT 35.6 06/16/2011 0953   PLT 223 12/21/2018 0932   PLT 233 06/16/2011 0953   MCV 90.7 12/21/2018 0932   MCV 86.6 06/16/2011 0953   MCH 29.5 12/21/2018 0932   MCHC 32.5 12/21/2018 0932   RDW 13.8 12/21/2018 0932   RDW 14.1 06/16/2011 0953   LYMPHSABS 1.4 12/21/2018 0932   LYMPHSABS 1.7 06/16/2011 0953   MONOABS 0.5 12/21/2018 0932   MONOABS 0.3 06/16/2011 0953   EOSABS 0.1 12/21/2018 0932   EOSABS 0.2 06/16/2011 0953   BASOSABS 0.1 12/21/2018 0932   BASOSABS 0.1 06/16/2011 0953    No results found for: POCLITH, LITHIUM   No results found for: PHENYTOIN, PHENOBARB, VALPROATE, CBMZ   .res Assessment: Plan:   Will continue current plan of care since target signs and symptoms are well controlled without any tolerability issues. Patient to follow-up in 4 months or sooner if clinically indicated. Patient advised to contact office with any questions, adverse effects, or acute worsening in signs and symptoms.  Erna was seen today for follow-up.  Diagnoses and all orders for this visit:  Bipolar II disorder ([place]) -     QUEtiapine (SEROQUEL XR) 300 MG 24 hr tablet; Take 2 tablets (600 mg total) by mouth every evening. -     QUEtiapine (SEROQUEL) 100 MG tablet; Take 1/2-1 tab po QHS -     OXcarbazepine (TRILEPTAL) 150 MG tablet; Take 1 tablet (150 mg total) by mouth 2 (two) times daily. -     venlafaxine XR (EFFEXOR XR) 75 MG 24 hr capsule; Take 1 capsule  (75 mg total) by mouth daily with breakfast. -     lamoTRIgine (LAMICTAL) 100 MG tablet; Take 100 mg by mouth in the morning and 200 mg by mouth at bedtime  Insomnia due to other mental disorder -     QUEtiapine (SEROQUEL) 100 MG tablet; Take 1/2-1 tab po QHS  Generalized anxiety disorder -     OXcarbazepine (TRILEPTAL) 150 MG tablet; Take 1 tablet (150 mg total) by mouth 2 (two) times daily. -     venlafaxine XR (EFFEXOR XR) 75 MG 24 hr capsule; Take 1 capsule (75 mg total) by mouth daily with breakfast.    Please see After Visit Summary for patient specific instructions.  No future appointments.  No orders of the defined types were placed in this encounter.     -------------------------------

## 2019-03-09 DIAGNOSIS — H524 Presbyopia: Secondary | ICD-10-CM | POA: Diagnosis not present

## 2019-03-09 DIAGNOSIS — H2513 Age-related nuclear cataract, bilateral: Secondary | ICD-10-CM | POA: Diagnosis not present

## 2019-03-16 DIAGNOSIS — I4891 Unspecified atrial fibrillation: Secondary | ICD-10-CM | POA: Diagnosis not present

## 2019-03-16 DIAGNOSIS — Z5181 Encounter for therapeutic drug level monitoring: Secondary | ICD-10-CM | POA: Diagnosis not present

## 2019-03-17 ENCOUNTER — Ambulatory Visit (INDEPENDENT_AMBULATORY_CARE_PROVIDER_SITE_OTHER): Payer: PPO

## 2019-03-17 DIAGNOSIS — Z5181 Encounter for therapeutic drug level monitoring: Secondary | ICD-10-CM

## 2019-03-17 DIAGNOSIS — Z952 Presence of prosthetic heart valve: Secondary | ICD-10-CM | POA: Diagnosis not present

## 2019-03-17 DIAGNOSIS — I4891 Unspecified atrial fibrillation: Secondary | ICD-10-CM

## 2019-03-17 LAB — PROTIME-INR: INR: 3.3 — AB (ref 0.9–1.1)

## 2019-03-17 NOTE — Patient Instructions (Signed)
Description   Spoke with pt and advised pt to continue on same dosage 1 tablet daily except 1/2 tablet on Mondays and Wednesdays. Recheck INR in 4 weeks. Warfarin manufacturer will change at end of October when she receives the med. Coumadin Clinic [phone]

## 2019-03-24 ENCOUNTER — Other Ambulatory Visit: Payer: Self-pay | Admitting: Family Medicine

## 2019-03-24 MED ORDER — ROSUVASTATIN CALCIUM 40 MG PO TABS: 40.0000 mg | ORAL_TABLET | Freq: Every day | ORAL | 1 refills | Status: DC

## 2019-03-24 NOTE — Telephone Encounter (Signed)
Requested medication (s) are due for refill today: yes  Requested medication (s) are on the active medication list: yes  Last refill:  08/27/2018  Future visit scheduled: no  Notes to clinic:  Overdue for office visit  Review for refill   Requested Prescriptions  Pending Prescriptions Disp Refills   rosuvastatin (CRESTOR) 40 MG tablet 90 tablet 1    Sig: Take 1 tablet (40 mg total) by mouth daily.     Cardiovascular:  Antilipid - Statins Failed - 03/24/2019  1:41 PM      Failed - Total Cholesterol in normal range and within 360 days    Cholesterol  Date Value Ref Range Status  03/18/2018 185 0 - 200 mg/dL Final    Comment:    ATP III Classification       Desirable:  < 200 mg/dL               Borderline High:  200 - 239 mg/dL          High:  > = 240 mg/dL         Failed - LDL in normal range and within 360 days    LDL Cholesterol  Date Value Ref Range Status  04/04/2013 71 0 - 99 mg/dL Final    Comment:      Total Cholesterol/HDL Ratio:CHD Risk                        Coronary Heart Disease Risk Table                                        Men       Women          1/2 Average Risk              3.4        3.3              Average Risk              5.0        4.4           2X Average Risk              9.6        7.1           3X Average Risk             23.4       11.0 Use the calculated Patient Ratio above and the CHD Risk table  to determine the patient's CHD Risk. ATP III Classification (LDL):       < 100        mg/dL         Optimal      100 - 129     mg/dL         Near or Above Optimal      130 - 159     mg/dL         Borderline High      160 - 189     mg/dL         High       > 190        mg/dL         Very High           Failed -  HDL in normal range and within 360 days    HDL  Date Value Ref Range Status  03/18/2018 44.30 >39.00 mg/dL Final         Failed - Triglycerides in normal range and within 360 days    Triglycerides  Date Value Ref Range Status   03/18/2018 (H) 0.0 - 149.0 mg/dL Final   553.0 Triglyceride is over 400; calculations on Lipids are invalid.    Comment:    Normal:  <150 mg/dLBorderline High:  150 - 199 mg/dL         Passed - Patient is not pregnant      Passed - Valid encounter within last 12 months    Recent Outpatient Visits          5 months ago Bipolar affective disorder, remission status unspecified ([place])   [facility] [place], [place]   1 year ago Type 2 diabetes mellitus with hyperglycemia, without long-term current use of insulin ([place])   [facility] [place], MD   1 year ago Type 2 diabetes mellitus with hyperglycemia, unspecified whether long term insulin use ([place])   [facility] at [place], MD   2 years ago Acute gout of right foot, unspecified cause   [facility] at [place], MD   2 years ago Essential hypertension, benign   [facility] at [place], [place], [place]

## 2019-03-24 NOTE — Telephone Encounter (Signed)
Medication Refill - Medication: rosuvastatin (CRESTOR) 40 MG tablet   Has the patient contacted their pharmacy? Yes.   (Agent: If no, request that the patient contact the pharmacy for the refill.) (Agent: If yes, when and what did the pharmacy advise?)  Preferred Pharmacy (with phone number or street name): [facility] [place], [place]  Agent: Please be advised that RX refills may take up to 3 business days. We ask that you follow-up with your pharmacy.

## 2019-03-25 ENCOUNTER — Telehealth: Payer: Self-pay

## 2019-03-25 NOTE — Telephone Encounter (Signed)
Started in error, RX was sent for 90 day supply to [facility] order pharmacy on 03/01/19

## 2019-04-13 DIAGNOSIS — I4891 Unspecified atrial fibrillation: Secondary | ICD-10-CM | POA: Diagnosis not present

## 2019-04-13 DIAGNOSIS — Z5181 Encounter for therapeutic drug level monitoring: Secondary | ICD-10-CM | POA: Diagnosis not present

## 2019-04-13 LAB — PROTIME-INR: INR: 2.1 — AB (ref 0.9–1.1)

## 2019-04-14 ENCOUNTER — Ambulatory Visit (INDEPENDENT_AMBULATORY_CARE_PROVIDER_SITE_OTHER): Payer: PPO | Admitting: *Deleted

## 2019-04-14 DIAGNOSIS — Z5181 Encounter for therapeutic drug level monitoring: Secondary | ICD-10-CM

## 2019-04-14 DIAGNOSIS — I4891 Unspecified atrial fibrillation: Secondary | ICD-10-CM

## 2019-04-14 DIAGNOSIS — Z952 Presence of prosthetic heart valve: Secondary | ICD-10-CM | POA: Diagnosis not present

## 2019-04-14 NOTE — Patient Instructions (Addendum)
Description   Spoke with pt and advised pt to take 1.5 tablets today then continue on same dosage 1 tablet daily except 1/2 tablet on Mondays and Wednesdays. Recheck INR in 3 weeks. Warfarin manufacturer will change at end of October when she receives the med. Coumadin Clinic [phone]

## 2019-04-15 ENCOUNTER — Other Ambulatory Visit: Payer: Self-pay

## 2019-04-15 DIAGNOSIS — F3181 Bipolar II disorder: Secondary | ICD-10-CM

## 2019-04-15 DIAGNOSIS — F411 Generalized anxiety disorder: Secondary | ICD-10-CM

## 2019-04-15 MED ORDER — OXCARBAZEPINE 150 MG PO TABS: 150.0000 mg | ORAL_TABLET | Freq: Two times a day (BID) | ORAL | 0 refills | Status: DC

## 2019-04-15 MED ORDER — QUETIAPINE FUMARATE ER 300 MG PO TB24: 600.0000 mg | ORAL_TABLET | Freq: Every evening | ORAL | 0 refills | Status: DC

## 2019-04-19 ENCOUNTER — Other Ambulatory Visit: Payer: Self-pay

## 2019-05-04 ENCOUNTER — Other Ambulatory Visit: Payer: Self-pay

## 2019-05-04 DIAGNOSIS — Z5181 Encounter for therapeutic drug level monitoring: Secondary | ICD-10-CM | POA: Diagnosis not present

## 2019-05-04 DIAGNOSIS — I4891 Unspecified atrial fibrillation: Secondary | ICD-10-CM | POA: Diagnosis not present

## 2019-05-04 LAB — PROTIME-INR: INR: 3.2 — AB (ref 0.9–1.1)

## 2019-05-04 MED ORDER — WARFARIN SODIUM 5 MG PO TABS: ORAL_TABLET | ORAL | 1 refills | Status: DC

## 2019-05-05 ENCOUNTER — Ambulatory Visit (INDEPENDENT_AMBULATORY_CARE_PROVIDER_SITE_OTHER): Payer: PPO | Admitting: *Deleted

## 2019-05-05 ENCOUNTER — Other Ambulatory Visit: Payer: Self-pay | Admitting: *Deleted

## 2019-05-05 ENCOUNTER — Other Ambulatory Visit: Payer: Self-pay | Admitting: Interventional Cardiology

## 2019-05-05 DIAGNOSIS — Z5181 Encounter for therapeutic drug level monitoring: Secondary | ICD-10-CM | POA: Diagnosis not present

## 2019-05-05 DIAGNOSIS — I4891 Unspecified atrial fibrillation: Secondary | ICD-10-CM | POA: Diagnosis not present

## 2019-05-05 DIAGNOSIS — Z952 Presence of prosthetic heart valve: Secondary | ICD-10-CM | POA: Diagnosis not present

## 2019-05-05 MED ORDER — ROPINIROLE HCL 0.25 MG PO TABS: 0.2500 mg | ORAL_TABLET | Freq: Every day | ORAL | 0 refills | Status: DC

## 2019-05-05 MED ORDER — WARFARIN SODIUM 5 MG PO TABS: ORAL_TABLET | ORAL | 1 refills | Status: DC

## 2019-05-05 NOTE — Telephone Encounter (Signed)
[facility] pharmacy is requesting direction on Warfarin, how should pt take this medication. Please resend to pharmacy with directions on sig. Please address

## 2019-05-05 NOTE — Telephone Encounter (Signed)
Received fax from [facility] mail order pharmacy for Ropinirole HCL 0.25mg . Refill sent. Pt is past due for follow up with PCP and will need to be seen for further refills. Mychart message sent to pt.

## 2019-05-05 NOTE — Patient Instructions (Signed)
Description   Spoke with pt and advised pt to continue on same dosage 1 tablet daily except 1/2 tablet on Mondays and Wednesdays. Recheck INR in 4 weeks in [place] (pt to return to [facility] in May 2021). Coumadin Clinic [phone]

## 2019-05-25 NOTE — Telephone Encounter (Signed)
05/05/19 mychart message came back as unread. Mailed letter.

## 2019-06-01 LAB — PROTIME-INR: INR: 2.5 — AB (ref 0.9–1.1)

## 2019-06-02 ENCOUNTER — Ambulatory Visit (INDEPENDENT_AMBULATORY_CARE_PROVIDER_SITE_OTHER): Payer: PPO | Admitting: *Deleted

## 2019-06-02 DIAGNOSIS — Z952 Presence of prosthetic heart valve: Secondary | ICD-10-CM

## 2019-06-02 DIAGNOSIS — Z5181 Encounter for therapeutic drug level monitoring: Secondary | ICD-10-CM | POA: Diagnosis not present

## 2019-06-02 DIAGNOSIS — I4891 Unspecified atrial fibrillation: Secondary | ICD-10-CM

## 2019-06-03 ENCOUNTER — Other Ambulatory Visit: Payer: Self-pay

## 2019-06-03 MED ORDER — FUROSEMIDE 40 MG PO TABS: 40.0000 mg | ORAL_TABLET | Freq: Every day | ORAL | 1 refills | Status: DC

## 2019-06-21 ENCOUNTER — Other Ambulatory Visit: Payer: Self-pay

## 2019-06-21 DIAGNOSIS — F411 Generalized anxiety disorder: Secondary | ICD-10-CM

## 2019-06-21 DIAGNOSIS — F3181 Bipolar II disorder: Secondary | ICD-10-CM

## 2019-06-21 MED ORDER — QUETIAPINE FUMARATE ER 300 MG PO TB24: 600.0000 mg | ORAL_TABLET | Freq: Every evening | ORAL | 0 refills | Status: DC

## 2019-06-21 MED ORDER — ROSUVASTATIN CALCIUM 40 MG PO TABS: 40.0000 mg | ORAL_TABLET | Freq: Every day | ORAL | 1 refills | Status: DC

## 2019-06-21 MED ORDER — ROPINIROLE HCL 0.25 MG PO TABS: 0.2500 mg | ORAL_TABLET | Freq: Every day | ORAL | 0 refills | Status: DC

## 2019-06-21 MED ORDER — OXCARBAZEPINE 150 MG PO TABS: 150.0000 mg | ORAL_TABLET | Freq: Two times a day (BID) | ORAL | 0 refills | Status: DC

## 2019-06-23 ENCOUNTER — Telehealth: Payer: Self-pay | Admitting: Internal Medicine

## 2019-06-23 ENCOUNTER — Telehealth: Payer: Self-pay | Admitting: Psychiatry

## 2019-06-23 ENCOUNTER — Other Ambulatory Visit: Payer: Self-pay

## 2019-06-23 MED ORDER — METFORMIN HCL ER 500 MG PO TB24: 1000.0000 mg | ORAL_TABLET | Freq: Two times a day (BID) | ORAL | 0 refills | Status: DC

## 2019-06-23 NOTE — Telephone Encounter (Signed)
MEDICATION: metFORMIN (GLUCOPHAGE XR) 500 MG 24 hr tablet  PHARMACY:   [facility] [place] Phone:  [phone]  Fax:  [phone]      IS THIS A 90 DAY SUPPLY : Yes  IS PATIENT OUT OF MEDICATION: Yes IF NOT; HOW MUCH IS LEFT: 0  LAST APPOINTMENT DATE: @03 /23/2020  NEXT APPOINTMENT DATE:@Visit  date not found  DO WE HAVE YOUR PERMISSION TO LEAVE A DETAILED MESSAGE: Yes  OTHER COMMENTS:    **Let patient know to contact pharmacy at the end of the day to make sure medication is ready. **  ** Please notify patient to allow 48-72 hours to process**  **Encourage patient to contact the pharmacy for refills or they can request refills through [facility]**

## 2019-06-23 NOTE — Telephone Encounter (Signed)
Pt has not been seen in 11 months, will send 30 day supply but pt needs follow up appointment.

## 2019-06-23 NOTE — Telephone Encounter (Signed)
[name] called to check on refills of her medications which have been submitted.  She wanted to let you know that she is still in [place] taking care of her mother.  Will probably be then 2-3 more months.  If she gets home, she will call for appt.

## 2019-06-23 NOTE — Telephone Encounter (Signed)
Patient requests that any/all medication refills  that Dr. Kelton Pillar prescribes be sent to:  [facility]) - [place], [place] Phone:  [phone]  Fax:  [phone]

## 2019-06-23 NOTE — Telephone Encounter (Signed)
LMTCB to schedule appointment °

## 2019-06-29 ENCOUNTER — Other Ambulatory Visit: Payer: Self-pay

## 2019-06-29 MED ORDER — METFORMIN HCL ER 500 MG PO TB24: 1000.0000 mg | ORAL_TABLET | Freq: Two times a day (BID) | ORAL | 1 refills | Status: DC

## 2019-06-29 NOTE — Telephone Encounter (Signed)
Called pt and she stated that for the last several months, she has been in [place] state caring for her elderly mother. Pt stated that her mother is expected to pass very soon and she will then be coming home. Pt would like to know if Dr. Kelton Pillar would continue to refill her medication until she can come home and be seen. Pt stated that she has every intention of making a follow up visit when she returns home, she has just been taking care of her mother for the last several months.

## 2019-06-29 NOTE — Telephone Encounter (Signed)
Please advise 

## 2019-06-30 DIAGNOSIS — Z5181 Encounter for therapeutic drug level monitoring: Secondary | ICD-10-CM | POA: Diagnosis not present

## 2019-06-30 DIAGNOSIS — I4891 Unspecified atrial fibrillation: Secondary | ICD-10-CM | POA: Diagnosis not present

## 2019-06-30 LAB — PROTIME-INR: INR: 5 — AB (ref 0.9–1.1)

## 2019-07-01 ENCOUNTER — Ambulatory Visit (INDEPENDENT_AMBULATORY_CARE_PROVIDER_SITE_OTHER): Payer: PPO | Admitting: *Deleted

## 2019-07-01 DIAGNOSIS — Z952 Presence of prosthetic heart valve: Secondary | ICD-10-CM

## 2019-07-01 DIAGNOSIS — I4891 Unspecified atrial fibrillation: Secondary | ICD-10-CM

## 2019-07-01 DIAGNOSIS — Z5181 Encounter for therapeutic drug level monitoring: Secondary | ICD-10-CM

## 2019-07-01 NOTE — Patient Instructions (Addendum)
Description   Spoke with pt and advised pt to hold today and hold tomorrow's dose then continue on same dosage 1 tablet daily except 1/2 tablet on Mondays and Wednesdays. Recheck INR in 2 weeks in [place] (pt to return to [facility] in May 2021). Coumadin Clinic [phone]

## 2019-07-12 ENCOUNTER — Telehealth: Payer: Self-pay | Admitting: Family Medicine

## 2019-07-12 NOTE — Telephone Encounter (Signed)
SB-Although pt started on Rosuvastatin on 2.19.2017 she looked at the SE yesterday when she received them via mail order/she is concerned about the fact that she has been doing a lot of falling and has muscle aches and these are SE of this med/is wanting to know what this med is for and if she can stop taking it/plz advise/thx dmf

## 2019-07-12 NOTE — Telephone Encounter (Signed)
Pt states she got a call from her mail in pharm on yesterday. They told her that the side effects to rosuvastatin (CRESTOR) 40 MG tablet   was freq falling and muscle aches. Patient states she's been doing a lot of falling and wants to know what is the meds for and if she can stop taking it. Please advise

## 2019-07-12 NOTE — Telephone Encounter (Signed)
The medicine is for keeping cholesterol down and dropping her risk of stroke, heart attack etc. She could stop it to see if her aches and pains get better. But I remember her having some of those symptoms before the med. If she takes a break for a month and the pain does not get better then it was not the med and she should go back on it.

## 2019-07-13 NOTE — Telephone Encounter (Signed)
Pt aware and will go off of Crestor for 1 month and reevaluate/thx dmf

## 2019-07-21 ENCOUNTER — Other Ambulatory Visit: Payer: Self-pay

## 2019-07-21 ENCOUNTER — Ambulatory Visit (INDEPENDENT_AMBULATORY_CARE_PROVIDER_SITE_OTHER): Payer: PPO | Admitting: *Deleted

## 2019-07-21 DIAGNOSIS — I4891 Unspecified atrial fibrillation: Secondary | ICD-10-CM

## 2019-07-21 DIAGNOSIS — Z5181 Encounter for therapeutic drug level monitoring: Secondary | ICD-10-CM

## 2019-07-21 DIAGNOSIS — Z952 Presence of prosthetic heart valve: Secondary | ICD-10-CM | POA: Diagnosis not present

## 2019-07-21 LAB — POCT INR: INR: 2.3 (ref 2.0–3.0)

## 2019-07-21 NOTE — Patient Instructions (Addendum)
Description   Take 1.5 tablets today and then continue on same dosage 1 tablet daily except 1/2 tablet on Mondays and Wednesdays. Recheck INR in 2 weeks. Coumadin Clinic [phone]

## 2019-08-01 ENCOUNTER — Other Ambulatory Visit: Payer: Self-pay | Admitting: *Deleted

## 2019-08-11 ENCOUNTER — Ambulatory Visit (INDEPENDENT_AMBULATORY_CARE_PROVIDER_SITE_OTHER): Payer: PPO | Admitting: *Deleted

## 2019-08-11 ENCOUNTER — Other Ambulatory Visit: Payer: Self-pay

## 2019-08-11 DIAGNOSIS — Z952 Presence of prosthetic heart valve: Secondary | ICD-10-CM | POA: Diagnosis not present

## 2019-08-11 DIAGNOSIS — Z5181 Encounter for therapeutic drug level monitoring: Secondary | ICD-10-CM | POA: Diagnosis not present

## 2019-08-11 DIAGNOSIS — I4891 Unspecified atrial fibrillation: Secondary | ICD-10-CM | POA: Diagnosis not present

## 2019-08-11 LAB — POCT INR: INR: 1.6 — AB (ref 2.0–3.0)

## 2019-08-11 NOTE — Patient Instructions (Signed)
Description   Take 1.5 tablets today and then start taking 1 tablet daily. Recheck INR in 1 week. Coumadin Clinic [phone]

## 2019-08-17 ENCOUNTER — Other Ambulatory Visit: Payer: Self-pay

## 2019-08-17 DIAGNOSIS — F3181 Bipolar II disorder: Secondary | ICD-10-CM

## 2019-08-17 DIAGNOSIS — F411 Generalized anxiety disorder: Secondary | ICD-10-CM

## 2019-08-17 MED ORDER — VENLAFAXINE HCL ER 75 MG PO CP24: 75.0000 mg | ORAL_CAPSULE | Freq: Every day | ORAL | 0 refills | Status: DC

## 2019-08-17 MED ORDER — LAMOTRIGINE 100 MG PO TABS: ORAL_TABLET | ORAL | 0 refills | Status: DC

## 2019-08-19 ENCOUNTER — Ambulatory Visit (INDEPENDENT_AMBULATORY_CARE_PROVIDER_SITE_OTHER): Payer: PPO | Admitting: *Deleted

## 2019-08-19 ENCOUNTER — Other Ambulatory Visit: Payer: Self-pay

## 2019-08-19 DIAGNOSIS — I4891 Unspecified atrial fibrillation: Secondary | ICD-10-CM | POA: Diagnosis not present

## 2019-08-19 DIAGNOSIS — Z5181 Encounter for therapeutic drug level monitoring: Secondary | ICD-10-CM | POA: Diagnosis not present

## 2019-08-19 DIAGNOSIS — Z952 Presence of prosthetic heart valve: Secondary | ICD-10-CM

## 2019-08-19 LAB — POCT INR: INR: 1.9 — AB (ref 2.0–3.0)

## 2019-08-19 NOTE — Patient Instructions (Signed)
Description   Today and tomorrow take 1.5 tablets then start taking 1 tablet daily except 1.5 tablets on Mondays and Fridays. Recheck INR in 10-12 days. Coumadin Clinic [phone]

## 2019-08-21 ENCOUNTER — Encounter (HOSPITAL_COMMUNITY): Payer: Self-pay | Admitting: Emergency Medicine

## 2019-08-21 ENCOUNTER — Emergency Department (HOSPITAL_COMMUNITY): Payer: PPO

## 2019-08-21 ENCOUNTER — Observation Stay (HOSPITAL_COMMUNITY)
Admission: EM | Admit: 2019-08-21 | Discharge: 2019-08-23 | Disposition: A | Discharge status: Home / Self Care | Payer: PPO | Attending: Internal Medicine | Admitting: Internal Medicine

## 2019-08-21 DIAGNOSIS — E782 Mixed hyperlipidemia: Secondary | ICD-10-CM | POA: Insufficient documentation

## 2019-08-21 DIAGNOSIS — E1142 Type 2 diabetes mellitus with diabetic polyneuropathy: Secondary | ICD-10-CM | POA: Diagnosis not present

## 2019-08-21 DIAGNOSIS — Z881 Allergy status to other antibiotic agents status: Secondary | ICD-10-CM | POA: Insufficient documentation

## 2019-08-21 DIAGNOSIS — R296 Repeated falls: Secondary | ICD-10-CM | POA: Insufficient documentation

## 2019-08-21 DIAGNOSIS — E669 Obesity, unspecified: Secondary | ICD-10-CM | POA: Diagnosis not present

## 2019-08-21 DIAGNOSIS — F419 Anxiety disorder, unspecified: Secondary | ICD-10-CM | POA: Insufficient documentation

## 2019-08-21 DIAGNOSIS — Z818 Family history of other mental and behavioral disorders: Secondary | ICD-10-CM | POA: Insufficient documentation

## 2019-08-21 DIAGNOSIS — R9431 Abnormal electrocardiogram [ECG] [EKG]: Secondary | ICD-10-CM | POA: Diagnosis not present

## 2019-08-21 DIAGNOSIS — Z9049 Acquired absence of other specified parts of digestive tract: Secondary | ICD-10-CM | POA: Insufficient documentation

## 2019-08-21 DIAGNOSIS — Z20822 Contact with and (suspected) exposure to covid-19: Secondary | ICD-10-CM | POA: Diagnosis not present

## 2019-08-21 DIAGNOSIS — M545 Low back pain: Secondary | ICD-10-CM | POA: Diagnosis not present

## 2019-08-21 DIAGNOSIS — Z6833 Body mass index (BMI) 33.0-33.9, adult: Secondary | ICD-10-CM | POA: Diagnosis not present

## 2019-08-21 DIAGNOSIS — E0500 Thyrotoxicosis with diffuse goiter without thyrotoxic crisis or storm: Secondary | ICD-10-CM | POA: Insufficient documentation

## 2019-08-21 DIAGNOSIS — G43909 Migraine, unspecified, not intractable, without status migrainosus: Secondary | ICD-10-CM | POA: Diagnosis not present

## 2019-08-21 DIAGNOSIS — I10 Essential (primary) hypertension: Secondary | ICD-10-CM | POA: Diagnosis present

## 2019-08-21 DIAGNOSIS — R519 Headache, unspecified: Secondary | ICD-10-CM | POA: Diagnosis not present

## 2019-08-21 DIAGNOSIS — F319 Bipolar disorder, unspecified: Secondary | ICD-10-CM | POA: Diagnosis not present

## 2019-08-21 DIAGNOSIS — N182 Chronic kidney disease, stage 2 (mild): Secondary | ICD-10-CM | POA: Diagnosis not present

## 2019-08-21 DIAGNOSIS — Y93E1 Activity, personal bathing and showering: Secondary | ICD-10-CM | POA: Diagnosis not present

## 2019-08-21 DIAGNOSIS — I4891 Unspecified atrial fibrillation: Secondary | ICD-10-CM | POA: Diagnosis not present

## 2019-08-21 DIAGNOSIS — W182XXA Fall in (into) shower or empty bathtub, initial encounter: Secondary | ICD-10-CM | POA: Insufficient documentation

## 2019-08-21 DIAGNOSIS — Z7982 Long term (current) use of aspirin: Secondary | ICD-10-CM | POA: Insufficient documentation

## 2019-08-21 DIAGNOSIS — B9620 Unspecified Escherichia coli [E. coli] as the cause of diseases classified elsewhere: Secondary | ICD-10-CM | POA: Diagnosis not present

## 2019-08-21 DIAGNOSIS — W19XXXA Unspecified fall, initial encounter: Secondary | ICD-10-CM

## 2019-08-21 DIAGNOSIS — M50322 Other cervical disc degeneration at C5-C6 level: Secondary | ICD-10-CM | POA: Insufficient documentation

## 2019-08-21 DIAGNOSIS — E1122 Type 2 diabetes mellitus with diabetic chronic kidney disease: Secondary | ICD-10-CM | POA: Diagnosis not present

## 2019-08-21 DIAGNOSIS — Z79899 Other long term (current) drug therapy: Secondary | ICD-10-CM | POA: Insufficient documentation

## 2019-08-21 DIAGNOSIS — Z811 Family history of alcohol abuse and dependence: Secondary | ICD-10-CM | POA: Insufficient documentation

## 2019-08-21 DIAGNOSIS — S199XXA Unspecified injury of neck, initial encounter: Secondary | ICD-10-CM | POA: Diagnosis not present

## 2019-08-21 DIAGNOSIS — D510 Vitamin B12 deficiency anemia due to intrinsic factor deficiency: Secondary | ICD-10-CM | POA: Insufficient documentation

## 2019-08-21 DIAGNOSIS — R42 Dizziness and giddiness: Secondary | ICD-10-CM | POA: Diagnosis not present

## 2019-08-21 DIAGNOSIS — R55 Syncope and collapse: Secondary | ICD-10-CM

## 2019-08-21 DIAGNOSIS — Z952 Presence of prosthetic heart valve: Secondary | ICD-10-CM

## 2019-08-21 DIAGNOSIS — N390 Urinary tract infection, site not specified: Secondary | ICD-10-CM | POA: Diagnosis present

## 2019-08-21 DIAGNOSIS — Z8249 Family history of ischemic heart disease and other diseases of the circulatory system: Secondary | ICD-10-CM | POA: Insufficient documentation

## 2019-08-21 DIAGNOSIS — Z888 Allergy status to other drugs, medicaments and biological substances status: Secondary | ICD-10-CM | POA: Insufficient documentation

## 2019-08-21 DIAGNOSIS — S299XXA Unspecified injury of thorax, initial encounter: Secondary | ICD-10-CM | POA: Diagnosis not present

## 2019-08-21 DIAGNOSIS — Z833 Family history of diabetes mellitus: Secondary | ICD-10-CM | POA: Insufficient documentation

## 2019-08-21 DIAGNOSIS — S0003XA Contusion of scalp, initial encounter: Secondary | ICD-10-CM | POA: Insufficient documentation

## 2019-08-21 DIAGNOSIS — I129 Hypertensive chronic kidney disease with stage 1 through stage 4 chronic kidney disease, or unspecified chronic kidney disease: Secondary | ICD-10-CM | POA: Insufficient documentation

## 2019-08-21 DIAGNOSIS — R001 Bradycardia, unspecified: Secondary | ICD-10-CM | POA: Diagnosis not present

## 2019-08-21 DIAGNOSIS — E785 Hyperlipidemia, unspecified: Secondary | ICD-10-CM | POA: Insufficient documentation

## 2019-08-21 DIAGNOSIS — R110 Nausea: Secondary | ICD-10-CM | POA: Diagnosis not present

## 2019-08-21 DIAGNOSIS — I447 Left bundle-branch block, unspecified: Secondary | ICD-10-CM | POA: Diagnosis not present

## 2019-08-21 DIAGNOSIS — Z90710 Acquired absence of both cervix and uterus: Secondary | ICD-10-CM | POA: Insufficient documentation

## 2019-08-21 DIAGNOSIS — Z794 Long term (current) use of insulin: Secondary | ICD-10-CM | POA: Insufficient documentation

## 2019-08-21 DIAGNOSIS — R52 Pain, unspecified: Secondary | ICD-10-CM | POA: Diagnosis not present

## 2019-08-21 DIAGNOSIS — E0590 Thyrotoxicosis, unspecified without thyrotoxic crisis or storm: Secondary | ICD-10-CM | POA: Diagnosis present

## 2019-08-21 DIAGNOSIS — M5136 Other intervertebral disc degeneration, lumbar region: Secondary | ICD-10-CM | POA: Insufficient documentation

## 2019-08-21 DIAGNOSIS — Z882 Allergy status to sulfonamides status: Secondary | ICD-10-CM | POA: Insufficient documentation

## 2019-08-21 DIAGNOSIS — Z8261 Family history of arthritis: Secondary | ICD-10-CM | POA: Insufficient documentation

## 2019-08-21 DIAGNOSIS — Z7901 Long term (current) use of anticoagulants: Secondary | ICD-10-CM | POA: Insufficient documentation

## 2019-08-21 DIAGNOSIS — I6523 Occlusion and stenosis of bilateral carotid arteries: Secondary | ICD-10-CM | POA: Diagnosis not present

## 2019-08-21 DIAGNOSIS — Z85828 Personal history of other malignant neoplasm of skin: Secondary | ICD-10-CM | POA: Insufficient documentation

## 2019-08-21 DIAGNOSIS — Z96651 Presence of right artificial knee joint: Secondary | ICD-10-CM | POA: Insufficient documentation

## 2019-08-21 DIAGNOSIS — I700 Atherosclerosis of aorta: Secondary | ICD-10-CM | POA: Insufficient documentation

## 2019-08-21 DIAGNOSIS — Z800 Family history of malignant neoplasm of digestive organs: Secondary | ICD-10-CM | POA: Insufficient documentation

## 2019-08-21 DIAGNOSIS — I6782 Cerebral ischemia: Secondary | ICD-10-CM | POA: Insufficient documentation

## 2019-08-21 DIAGNOSIS — S3993XA Unspecified injury of pelvis, initial encounter: Secondary | ICD-10-CM | POA: Diagnosis not present

## 2019-08-21 DIAGNOSIS — Z87891 Personal history of nicotine dependence: Secondary | ICD-10-CM | POA: Insufficient documentation

## 2019-08-21 DIAGNOSIS — M50221 Other cervical disc displacement at C4-C5 level: Secondary | ICD-10-CM | POA: Insufficient documentation

## 2019-08-21 HISTORY — DX: Syncope and collapse: R55

## 2019-08-21 LAB — PROTIME-INR
INR: 2 — ABNORMAL HIGH (ref 0.8–1.2)
Prothrombin Time: 22.3 seconds — ABNORMAL HIGH (ref 11.4–15.2)

## 2019-08-21 LAB — URINALYSIS, ROUTINE W REFLEX MICROSCOPIC
Bacteria, UA: NONE SEEN
Bilirubin Urine: NEGATIVE
Glucose, UA: NEGATIVE mg/dL
Hgb urine dipstick: NEGATIVE
Ketones, ur: NEGATIVE mg/dL
Nitrite: NEGATIVE
Protein, ur: NEGATIVE mg/dL
Specific Gravity, Urine: 1.004 — ABNORMAL LOW (ref 1.005–1.030)
pH: 6 (ref 5.0–8.0)

## 2019-08-21 LAB — COMPREHENSIVE METABOLIC PANEL
ALT: 24 U/L (ref 0–44)
AST: 25 U/L (ref 15–41)
Albumin: 4.2 g/dL (ref 3.5–5.0)
Alkaline Phosphatase: 55 U/L (ref 38–126)
Anion gap: 16 — ABNORMAL HIGH (ref 5–15)
BUN: 10 mg/dL (ref 8–23)
CO2: 27 mmol/L (ref 22–32)
Calcium: 9.4 mg/dL (ref 8.9–10.3)
Chloride: 98 mmol/L (ref 98–111)
Creatinine, Ser: 1.1 mg/dL — ABNORMAL HIGH (ref 0.44–1.00)
GFR calc Af Amer: 60 mL/min — ABNORMAL LOW (ref >60)
GFR calc non Af Amer: 52 mL/min — ABNORMAL LOW (ref >60)
Glucose, Bld: 133 mg/dL — ABNORMAL HIGH (ref 70–99)
Potassium: 3.7 mmol/L (ref 3.5–5.1)
Sodium: 141 mmol/L (ref 135–145)
Total Bilirubin: 1 mg/dL (ref 0.3–1.2)
Total Protein: 7.5 g/dL (ref 6.5–8.1)

## 2019-08-21 LAB — CBC WITH DIFFERENTIAL/PLATELET
Abs Immature Granulocytes: 0.02 10*3/uL (ref 0.00–0.07)
Basophils Absolute: 0.1 10*3/uL (ref 0.0–0.1)
Basophils Relative: 1 %
Eosinophils Absolute: 0.2 10*3/uL (ref 0.0–0.5)
Eosinophils Relative: 2 %
HCT: 40.4 % (ref 36.0–46.0)
Hemoglobin: 13 g/dL (ref 12.0–15.0)
Immature Granulocytes: 0 %
Lymphocytes Relative: 15 %
Lymphs Abs: 1.2 10*3/uL (ref 0.7–4.0)
MCH: 29.3 pg (ref 26.0–34.0)
MCHC: 32.2 g/dL (ref 30.0–36.0)
MCV: 91.2 fL (ref 80.0–100.0)
Monocytes Absolute: 0.5 10*3/uL (ref 0.1–1.0)
Monocytes Relative: 7 %
Neutro Abs: 6.1 10*3/uL (ref 1.7–7.7)
Neutrophils Relative %: 75 %
Platelets: 257 10*3/uL (ref 150–400)
RBC: 4.43 MIL/uL (ref 3.87–5.11)
RDW: 14.5 % (ref 11.5–15.5)
WBC: 8.1 10*3/uL (ref 4.0–10.5)
nRBC: 0 % (ref 0.0–0.2)

## 2019-08-21 LAB — I-STAT CREATININE, ED: Creatinine, Ser: 1 mg/dL (ref 0.44–1.00)

## 2019-08-21 LAB — LIPASE, BLOOD: Lipase: 33 U/L (ref 11–51)

## 2019-08-21 LAB — MAGNESIUM: Magnesium: 1.7 mg/dL (ref 1.7–2.4)

## 2019-08-21 MED ORDER — IOHEXOL 350 MG/ML SOLN
75.0000 mL | Freq: Once | INTRAVENOUS | Status: AC | PRN
  Administered 2019-08-21: 75 mL via INTRAVENOUS

## 2019-08-21 MED ORDER — MORPHINE SULFATE (PF) 2 MG/ML IV SOLN
2.0000 mg | Freq: Once | INTRAVENOUS | Status: AC
  Administered 2019-08-21: 2 mg via INTRAVENOUS
  Filled 2019-08-21: qty 1

## 2019-08-21 MED ORDER — ONDANSETRON HCL 4 MG/2ML IJ SOLN
4.0000 mg | Freq: Once | INTRAMUSCULAR | Status: AC
  Administered 2019-08-21: 4 mg via INTRAVENOUS
  Filled 2019-08-21: qty 2

## 2019-08-21 NOTE — ED Notes (Signed)
Pt ambulated to and from bathroom with assistance from RN. Upon sitting back down, pt reported feeling dizzy.

## 2019-08-21 NOTE — Progress Notes (Signed)
Orthopedic Tech Progress Note Patient Details:  [name] 1950-10-31 [id number]  Patient ID: [name], female   DOB: 24-Sep-1950, 69 y.o.   MRN: [id number]   [name] 08/21/2019, 2:09 PMLevel 2 Trauma alert

## 2019-08-21 NOTE — ED Triage Notes (Signed)
Pt here as a level 2 trauma after falling in the bath tub and is on coumadin ,pt positive loc before the fall , hematoma to the back of the head .

## 2019-08-21 NOTE — ED Provider Notes (Signed)
[place] EMERGENCY DEPARTMENT Provider Note   CSN: [id number] Arrival date & time: 08/21/19  1220     History No chief complaint on file.   [name] is a 69 y.o. female who presents as a level 2 trauma after ground-level fall on blood thinners.  Patient is on a blood thinner for history of aortic valve replacement and was subtherapeutic on her Coumadin with an INR of 1.9 just earlier this week.  Daughter at bedside gives history along with the patient herself.  The daughter states that they have been trying to bring her Coumadin levels up.  Patient has had 2 falls this week which is abnormal for her earlier this week she fell getting out of the bathtub and hit her chest wall has some bruising there.  Today the patient slipped while bathing, fell backward and hit the back of her head hard against the porcelain tub.  She had immediate severe headache and room spinning dizziness for about 2 minutes with severe nausea which resolved.  Patient still has some mild dizziness, bad headache and some mild nausea.  Patient states that she has migraine headaches and took one of her "migraine pills" prior to arrival.  Patient denies syncope.  She denies melena or hematochezia.  HPI     Past Medical History:  Diagnosis Date  . ANEMIA 05/28/2010  . AORTIC VALVE REPLACEMENT, HX OF 03/11/2010   Mechanical prosthesis  . BIPOLAR AFFECTIVE DISORDER 03/11/2010  . Depression   . Diverticulitis 12/18/2010  . FIBROIDS, UTERUS 03/11/2010  . Gout 09/04/2016  . Grave's disease 8-12  . Hyperlipidemia associated with type 2 diabetes mellitus ([employer]) 06/05/2016  . Hypertension   . Low back pain 11/02/2017  . Migraine 06/05/2016  . Mixed hyperlipidemia 03/11/2010  . Obesity   . Skin cancer   . UTI (lower urinary tract infection) 08/14/2011    Patient Active Problem List   Diagnosis Date Noted  . GAD (generalized anxiety disorder) 04/25/2018  . Ankle pain 02/04/2018  . Nocturia 11/02/2017   . Low back pain 11/02/2017  . Type 2 diabetes mellitus with diabetic polyneuropathy, without long-term current use of insulin ([place]) 11/02/2017  . Hyperlipidemia associated with type 2 diabetes mellitus ([place]) 06/05/2016  . Migraine 06/05/2016  . Encounter for therapeutic drug monitoring 09/13/2013  . Essential hypertension, benign 04/05/2013  . Atrial fibrillation ([place]) 03/18/2013  . Long term current use of anticoagulant therapy 03/11/2013  . Urinary tract infection 02/10/2013  . Pernicious anemia 02/21/2011  . Renal insufficiency 12/30/2010  . Hyperthyroidism 12/18/2010  . Bipolar disorder ([place]) 03/11/2010  . DIVERTICULOSIS OF COLON 03/11/2010  . Constipation 03/11/2010  . Hx of mechanical aortic valve replacement 03/11/2010    Past Surgical History:  Procedure Laterality Date  . ABDOMINAL HYSTERECTOMY  ?1998   sb/l spo, total for fibroids/heavy bleeding  . ANKLE SURGERY Left 01/2018   hardware removal  . AORTIC VALVE REPLACEMENT  2011   Mechanical prosthesis, [employer]  . APPENDECTOMY     Required revision for EColi infection, required recurrent packing  . bowel obstruction     Requiring adhesions to be removed  . CHOLECYSTECTOMY    . COLONOSCOPY WITH PROPOFOL N/A 11/01/2015   Procedure: COLONOSCOPY WITH PROPOFOL;  Surgeon: Milus Banister, MD;  Location: [facility];  Service: Endoscopy;  Laterality: N/A;  . CYSTOCELE REPAIR N/A 10/16/2015   Procedure: ANTERIOR REPAIR (CYSTOCELE);  Surgeon: Donnamae Jude, MD;  Location: [facility];  Service: Gynecology;  Laterality:  N/A;  . HERNIA REPAIR  08-16-10  . LEFT HEART CATHETERIZATION WITH CORONARY ANGIOGRAM N/A 12/05/2011   Procedure: LEFT HEART CATHETERIZATION WITH CORONARY ANGIOGRAM;  Surgeon: Sinclair Grooms, MD;  Location: [facility];  Service: Cardiovascular;  Laterality: N/A;  . TOTAL KNEE ARTHROPLASTY Right 02/24/2013   Procedure: RIGHT TOTAL KNEE ARTHROPLASTY;  Surgeon: Johnn Hai, MD;  Location: [facility];  Service:  Orthopedics;  Laterality: Right;  . TUBAL LIGATION    . varicose vein surgery b/l legs       OB History    Gravida  2   Para  2   Term  2   Preterm      AB      Living  2     SAB      TAB      Ectopic      Multiple      Live Births              Family History  Problem Relation Age of Onset  . Scoliosis Mother   . Arthritis Mother        Rheumatoid  . Aneurysm Mother        heart  . Alcohol abuse Father   . Depression Sister   . Depression Brother   . Alcohol abuse Brother   . Cancer Maternal Grandmother        colon  . Diabetes Maternal Grandfather   . Heart disease Maternal Grandfather   . Alcohol abuse Maternal Grandfather   . Depression Paternal Grandmother   . Diabetes Paternal Grandmother   . Depression Paternal Grandfather   . Diabetes Paternal Grandfather   . Depression Sister   . Alcohol abuse Brother   . Depression Brother   . Heart disease Brother     Social History   Tobacco Use  . Smoking status: Former Smoker    Quit date: 05/26/1990    Years since quitting: 29.2  . Smokeless tobacco: Never Used  Substance Use Topics  . Alcohol use: Yes    Alcohol/week: 0.0 standard drinks    Comment: rare  . Drug use: No    Home Medications Prior to Admission medications   Medication Sig Start Date End Date Taking? Authorizing Provider  acetaminophen (TYLENOL) 500 MG tablet Take 1,000-1,500 mg by mouth every 6 (six) hours as needed (For knee pain.).    [provider]  amLODipine (NORVASC) 5 MG tablet Take 1 tablet (5 mg total) by mouth daily. 03/03/19   Belva Crome, MD  aspirin EC 81 MG tablet Take 81 mg by mouth daily.    [provider]  clonazePAM (KLONOPIN) 0.5 MG tablet Take 1 tablet (0.5 mg total) by mouth at bedtime. 03/27/19 06/25/19  Thayer Headings, PMHNP  enoxaparin (LOVENOX) 120 MG/0.8ML injection Inject 0.8 mLs (120 mg total) into the skin every 12 (twelve) hours. As directed by coumadin clinic Patient not  taking: Reported on 01/04/2019 12/28/18   Constance Haw, MD  fenofibrate micronized (ANTARA) 130 MG capsule Take 1 capsule (130 mg total) by mouth daily before breakfast. 03/19/18   Mosie Lukes, MD  furosemide (LASIX) 40 MG tablet Take 1 tablet (40 mg total) by mouth daily. 06/03/19   Belva Crome, MD  glucose blood [facility] VERIO) test strip Check blood sugars once daily Dx: E11.9 10/12/18   Mosie Lukes, MD  lamoTRIgine (LAMICTAL) 100 MG tablet Take 100 mg by mouth in the morning and 200  mg by mouth at bedtime 08/17/19   Thayer Headings, PMHNP  Lancets [facility] ULTRASOFT) lancets Use as instructed to test blood sugars once daily Dx. E11.65 08/18/18   Shamleffer, Melanie Crazier, MD  metFORMIN (GLUCOPHAGE XR) 500 MG 24 hr tablet Take 2 tablets (1,000 mg total) by mouth 2 (two) times daily with a meal. 06/29/19   Shamleffer, Melanie Crazier, MD  metoprolol succinate (TOPROL-XL) 50 MG 24 hr tablet Take 1 tablet (50 mg total) by mouth daily. Take with or immediately following a meal. 12/14/18 03/14/19  Belva Crome, MD  ONE TOUCH LANCETS MISC Test once daily to check blood sugar. DX E11.9 04/26/15   Mosie Lukes, MD  OXcarbazepine (TRILEPTAL) 150 MG tablet Take 1 tablet (150 mg total) by mouth 2 (two) times daily. 06/21/19 09/19/19  Thayer Headings, PMHNP  pantoprazole (PROTONIX) 40 MG tablet Take 1 tablet (40 mg total) by mouth daily. 08/27/18   Mosie Lukes, MD  QUEtiapine (SEROQUEL XR) 300 MG 24 hr tablet Take 2 tablets (600 mg total) by mouth every evening. 06/21/19 09/19/19  Thayer Headings, PMHNP  QUEtiapine (SEROQUEL) 100 MG tablet Take 1/2-1 tab po QHS 03/04/19   Thayer Headings, PMHNP  rOPINIRole (REQUIP) 0.25 MG tablet Take 1-2 tablets (0.25-0.5 mg total) by mouth at bedtime. 06/21/19   Mosie Lukes, MD  rosuvastatin (CRESTOR) 40 MG tablet Take 1 tablet (40 mg total) by mouth daily. 06/21/19   Mosie Lukes, MD  SUMAtriptan (IMITREX) 50 MG tablet TAKE ONE TABLET EVERY 2 HOURS AS  NEEDED FOR MIGRAINE. MAY REPEAT IN 2 HOURS IF HEADACHE PERSISTS OR RECURS. (MAX OF 2 TABLETS IN 24 HOURS) Patient taking differently: Take 50 mg by mouth every 2 (two) hours as needed for migraine or headache.  12/01/17   Mosie Lukes, MD  traMADol (ULTRAM) 50 MG tablet Take by mouth every 6 (six) hours as needed.    [provider]  venlafaxine XR (EFFEXOR XR) 75 MG 24 hr capsule Take 1 capsule (75 mg total) by mouth daily with breakfast. 08/17/19 11/15/19  Thayer Headings, PMHNP  warfarin (COUMADIN) 5 MG tablet Take 5mg  daily except 2.5mg  on Monday and Wednesday or take as directed by Coumadin Clinic. 05/05/19   Belva Crome, MD  methimazole (TAPAZOLE) 5 MG tablet Take 1 tablet (5 mg total) by mouth 3 (three) times daily. 06/24/11 08/05/11  Renato Shin, MD    Allergies    Mucinex [guaifenesin er], Keflex [cephalexin], and Sulfonamide derivatives  Review of Systems   Review of Systems Ten systems reviewed and are negative for acute change, except as noted in the HPI.   Physical Exam Updated Vital Signs BP (!) 156/82   Pulse 78   Temp 99 F (37.2 C) (Oral)   Resp (!) 22   Ht 5\' 9"  (1.753 m)   Wt 102.1 kg   SpO2 96%   BMI 33.23 kg/m   Physical Exam Vitals and nursing note reviewed.  Constitutional:      General: She is not in acute distress.    Appearance: She is well-developed. She is not diaphoretic.  HENT:     Head: Normocephalic and atraumatic.  Eyes:     General: No scleral icterus.    Extraocular Movements: Extraocular movements intact.     Right eye: No nystagmus.     Left eye: No nystagmus.     Conjunctiva/sclera: Conjunctivae normal.     Pupils: Pupils are equal, round, and reactive to light.  Cardiovascular:  Rate and Rhythm: Normal rate and regular rhythm.     Heart sounds: Normal heart sounds. No murmur. No friction rub. No gallop.   Pulmonary:     Effort: Pulmonary effort is normal. No respiratory distress.     Breath sounds: Normal breath  sounds.  Abdominal:     General: Bowel sounds are normal. There is no distension.     Palpations: Abdomen is soft. There is no mass.     Tenderness: There is no abdominal tenderness. There is no guarding.  Musculoskeletal:     Cervical back: Normal range of motion.  Skin:    General: Skin is warm and dry.  Neurological:     Mental Status: She is alert and oriented to person, place, and time.     GCS: GCS eye subscore is 4. GCS verbal subscore is 5. GCS motor subscore is 6.     Cranial Nerves: Cranial nerves are intact.     Sensory: Sensation is intact.     Motor: Motor function is intact.     Coordination: Coordination is intact.     Deep Tendon Reflexes: Reflexes normal.     Comments: Gait deffered  Psychiatric:        Behavior: Behavior normal.     ED Results / Procedures / Treatments   Labs (all labs ordered are listed, but only abnormal results are displayed) Labs Reviewed  COMPREHENSIVE METABOLIC PANEL - Abnormal; Notable for the following components:      Result Value   Glucose, Bld 133 (*)    Creatinine, Ser 1.10 (*)    GFR calc non Af Amer 52 (*)    GFR calc Af Amer 60 (*)    Anion gap 16 (*)    All other components within normal limits  PROTIME-INR - Abnormal; Notable for the following components:   Prothrombin Time 22.3 (*)    INR 2.0 (*)    All other components within normal limits  LIPASE, BLOOD  CBC WITH DIFFERENTIAL/PLATELET  I-STAT CREATININE, ED    EKG EKG Interpretation  Date/Time:  Sunday August 21 2019 12:28:35 EDT Ventricular Rate:  77 PR Interval:    QRS Duration: 101 QT Interval:  576 QTC Calculation: 653 R Axis:   47 Text Interpretation: Atrial fibrillation Borderline T abnormalities, anterior leads Prolonged QT interval Confirmed by Quintella Reichert [phone]) on 08/21/2019 12:37:03 PM   Radiology CT Head Wo Contrast  Result Date: 08/21/2019 CLINICAL DATA:  Head trauma with posterior scalp hematoma secondary to a fall in the bathtub. The  patient takes Coumadin. Loss of consciousness before the fall. EXAM: CT HEAD WITHOUT CONTRAST CT CERVICAL SPINE WITHOUT CONTRAST TECHNIQUE: Multidetector CT imaging of the head and cervical spine was performed following the standard protocol without intravenous contrast. Multiplanar CT image reconstructions of the cervical spine were also generated. COMPARISON:  CT scan of the head dated 04/06/2014 FINDINGS: CT HEAD FINDINGS Brain: No evidence of acute infarction, hemorrhage, hydrocephalus, extra-axial collection or mass lesion/mass effect. There are a few tiny areas of periventricular white matter lucency consistent with chronic small vessel ischemic disease, unchanged since the prior study. Slight asymmetry of the lateral ventricles, stable. Vascular: No hyperdense vessel or unexpected calcification. Skull: Normal. Negative for fracture or focal lesion. Sinuses/Orbits: Normal. Other: None CT CERVICAL SPINE FINDINGS Alignment: Cervical alignment is normal. 2 mm spondylolisthesis of T2 on T3 felt to be due to bilateral facet arthritis. Skull base and vertebrae: No acute fracture. No primary bone lesion or focal pathologic process.  Soft tissues and spinal canal: No prevertebral fluid or swelling. No visible canal hematoma. Disc levels: C2-3 and C3-4: Normal discs. Minimal degenerative changes of the facet joints. C4-5: Tiny central disc bulge with no neural impingement. Slight degenerative changes of the left facet joint. C5-6: Marked degenerative disc disease with disc space narrowing and prominent degenerative changes of the vertebral endplates. Broad-based disc osteophyte complex without soft disc protrusion. No focal neural impingement. No significant foraminal stenosis. No facet arthritis. C6-7: Disc space narrowing. Broad-based disc osteophyte complex without neural impingement. Widely patent neural foramina. Normal facet joints. C7-T1: Normal disc. Moderately severe left facet arthritis. No foraminal stenosis.  T1-2: Normal. T2-3: Moderate right and mild left facet arthritis. 2 mm spondylolisthesis of T2 on T3 without disc bulging or disc protrusion. T3-4 and T4-5: Normal discs. Moderate right facet arthritis at each level. Upper chest: Emphysematous changes in both lung apices. Other: None IMPRESSION: 1. No acute intracranial abnormality. Minimal chronic small vessel ischemic changes in the periventricular white matter. 2. No acute abnormality of the cervical spine. Multilevel degenerative disc and joint disease. Electronically Signed   By: Lorriane Shire M.D.   On: 08/21/2019 13:24   CT Cervical Spine Wo Contrast  Result Date: 08/21/2019 CLINICAL DATA:  Head trauma with posterior scalp hematoma secondary to a fall in the bathtub. The patient takes Coumadin. Loss of consciousness before the fall. EXAM: CT HEAD WITHOUT CONTRAST CT CERVICAL SPINE WITHOUT CONTRAST TECHNIQUE: Multidetector CT imaging of the head and cervical spine was performed following the standard protocol without intravenous contrast. Multiplanar CT image reconstructions of the cervical spine were also generated. COMPARISON:  CT scan of the head dated 04/06/2014 FINDINGS: CT HEAD FINDINGS Brain: No evidence of acute infarction, hemorrhage, hydrocephalus, extra-axial collection or mass lesion/mass effect. There are a few tiny areas of periventricular white matter lucency consistent with chronic small vessel ischemic disease, unchanged since the prior study. Slight asymmetry of the lateral ventricles, stable. Vascular: No hyperdense vessel or unexpected calcification. Skull: Normal. Negative for fracture or focal lesion. Sinuses/Orbits: Normal. Other: None CT CERVICAL SPINE FINDINGS Alignment: Cervical alignment is normal. 2 mm spondylolisthesis of T2 on T3 felt to be due to bilateral facet arthritis. Skull base and vertebrae: No acute fracture. No primary bone lesion or focal pathologic process. Soft tissues and spinal canal: No prevertebral fluid or  swelling. No visible canal hematoma. Disc levels: C2-3 and C3-4: Normal discs. Minimal degenerative changes of the facet joints. C4-5: Tiny central disc bulge with no neural impingement. Slight degenerative changes of the left facet joint. C5-6: Marked degenerative disc disease with disc space narrowing and prominent degenerative changes of the vertebral endplates. Broad-based disc osteophyte complex without soft disc protrusion. No focal neural impingement. No significant foraminal stenosis. No facet arthritis. C6-7: Disc space narrowing. Broad-based disc osteophyte complex without neural impingement. Widely patent neural foramina. Normal facet joints. C7-T1: Normal disc. Moderately severe left facet arthritis. No foraminal stenosis. T1-2: Normal. T2-3: Moderate right and mild left facet arthritis. 2 mm spondylolisthesis of T2 on T3 without disc bulging or disc protrusion. T3-4 and T4-5: Normal discs. Moderate right facet arthritis at each level. Upper chest: Emphysematous changes in both lung apices. Other: None IMPRESSION: 1. No acute intracranial abnormality. Minimal chronic small vessel ischemic changes in the periventricular white matter. 2. No acute abnormality of the cervical spine. Multilevel degenerative disc and joint disease. Electronically Signed   By: Lorriane Shire M.D.   On: 08/21/2019 13:24    Procedures Procedures (including  critical care time)  Medications Ordered in ED Medications - No data to display  ED Course  I have reviewed the triage vital signs and the nursing notes.  Pertinent labs & imaging results that were available during my care of the patient were reviewed by me and considered in my medical decision making (see chart for details).  Clinical Course as of Aug 21 1315  Sun Aug 21, 2019  1313 INR(!): 2.0 [AH]    Clinical Course User Index [AH] Margarita Mail, PA-C   MDM Rules/Calculators/A&P                      This is a 69 year old female with recent falls.   Patient fell and hit the back of her head tonight.  Since that time she has had intermittent vertigo.  This is likely representative of concussion however question something like a vertebral artery dissection.  Initial imaging of the head and neck by a CT scan are negative for acute abnormality on my interpretation.  UA negative for infection.  Her CMP shows slightly elevated blood glucose of insignificant value.  Mildly elevated creatinine at 1.1.  Lipase within normal limits, CBC without abnormality.  Patient mag level within normal limits.  PT/INR still mildly subtherapeutic for mechanical valve.  Patient's review x-ray are negative.  Lumbar spine x-ray is also negative.  CT angiogram of the head and neck are pending.  Given signout to PA Le[place] who will assume care of the patient Final Clinical Impression(s) / ED Diagnoses Final diagnoses:  None    Rx / DC Orders ED Discharge Orders    None       Margarita Mail, PA-C 08/21/19 1651    Quintella Reichert, MD 08/23/19 [phone]

## 2019-08-21 NOTE — ED Provider Notes (Signed)
Care assumed from [place], PA-C at shift change with imaging pending .  In brief, this patient is a 69 y.o. F who presents as a level 2 trauma ground level fall on blood thinners. Patient reports that she was getting bathing and slipped, causing her to hit the back of her head. Since the fall she has had a severe headache as well as vertigo. This vertigo is intermittent in nature. Please see note from previous provider for full history/physical exam.     Physical Exam  BP 139/78   Pulse 62   Temp 99 F (37.2 C) (Oral)   Resp 16   Ht 5\' 9"  (1.753 m)   Wt 102.1 kg   SpO2 95%   BMI 33.23 kg/m   Physical Exam  ED Course/Procedures   Clinical Course as of Aug 21 21  Sun Aug 21, 2019  1313 INR(!): 2.0 [AH]    Clinical Course User Index [AH] Margarita Mail, PA-C    Procedures   EKG Interpretation  Date/Time:  Sunday August 21 2019 12:28:35 EDT Ventricular Rate:  77 PR Interval:    QRS Duration: 101 QT Interval:  576 QTC Calculation: 653 R Axis:   47 Text Interpretation: Atrial fibrillation Borderline T abnormalities, anterior leads Prolonged QT interval Confirmed by Quintella Reichert [phone]) on 08/21/2019 12:37:03 PM   MDM    PLAN: Patient pending labs, CTA.  Plan for admission.  MDM:  UA shows moderate leukocytes.  She does have some small and a bacteria.  Lipase, magnesium unremarkable.  INR is 2.0.  CMP shows normal BUN.  Creatinine is 1.10.  CBC shows no leukocytosis or anemia.  CTA head and neck shows mild atherosclerosis without large vessel occlusion.  X-ray of hip shows no pelvic fracture.  Lumbar spine shows no evidence of fracture.  Chest x-ray shows no evidence of rib fracture.  CT cervical spine shows no acute abnormality.  No acute intracranial abnormality seen on the CT head.  Given unclear etiology of falls and new QTC prolongation, previous team recommended admission.  Discussed patient with Dr. Paticia Stack (Cardiology).  He feels it is reasonable  to monitor with serial repeat EKGs.  Recommends med telemetry bed and admission to medicine.  Cardiology will consult tomorrow.  No acute intervention needed.  Discussed patient with Dr. Hal Hope (hospitalist) who accepts patient for admission.   1. QT prolongation   2. Fall, initial encounter   3. Vertigo    Portions of this note were generated with Dragon dictation software. Dictation errors may occur despite best attempts at proofreading.    Volanda Napoleon, PA-C 08/22/19 [name]    Virgel Manifold, MD 08/22/19 [phone]

## 2019-08-21 NOTE — ED Notes (Signed)
Pt transported to Ct scan.

## 2019-08-22 ENCOUNTER — Encounter (HOSPITAL_COMMUNITY): Payer: Self-pay | Admitting: Internal Medicine

## 2019-08-22 ENCOUNTER — Telehealth: Payer: Self-pay | Admitting: Interventional Cardiology

## 2019-08-22 ENCOUNTER — Other Ambulatory Visit: Payer: Self-pay

## 2019-08-22 DIAGNOSIS — R9431 Abnormal electrocardiogram [ECG] [EKG]: Secondary | ICD-10-CM | POA: Diagnosis present

## 2019-08-22 DIAGNOSIS — Z952 Presence of prosthetic heart valve: Secondary | ICD-10-CM

## 2019-08-22 DIAGNOSIS — R55 Syncope and collapse: Secondary | ICD-10-CM | POA: Diagnosis not present

## 2019-08-22 HISTORY — DX: Syncope and collapse: R55

## 2019-08-22 LAB — SARS CORONAVIRUS 2 (TAT 6-24 HRS): SARS Coronavirus 2: NEGATIVE

## 2019-08-22 LAB — PROTIME-INR
INR: 2.1 — ABNORMAL HIGH (ref 0.8–1.2)
Prothrombin Time: 23.2 seconds — ABNORMAL HIGH (ref 11.4–15.2)

## 2019-08-22 LAB — CBC
HCT: 39.2 % (ref 36.0–46.0)
Hemoglobin: 12.2 g/dL (ref 12.0–15.0)
MCH: 29 pg (ref 26.0–34.0)
MCHC: 31.1 g/dL (ref 30.0–36.0)
MCV: 93.3 fL (ref 80.0–100.0)
Platelets: 246 10*3/uL (ref 150–400)
RBC: 4.2 MIL/uL (ref 3.87–5.11)
RDW: 14.7 % (ref 11.5–15.5)
WBC: 10.8 10*3/uL — ABNORMAL HIGH (ref 4.0–10.5)
nRBC: 0 % (ref 0.0–0.2)

## 2019-08-22 LAB — CBG MONITORING, ED
Glucose-Capillary: 138 mg/dL — ABNORMAL HIGH (ref 70–99)
Glucose-Capillary: 95 mg/dL (ref 70–99)

## 2019-08-22 LAB — BASIC METABOLIC PANEL
Anion gap: 14 (ref 5–15)
BUN: 10 mg/dL (ref 8–23)
CO2: 29 mmol/L (ref 22–32)
Calcium: 8.8 mg/dL — ABNORMAL LOW (ref 8.9–10.3)
Chloride: 98 mmol/L (ref 98–111)
Creatinine, Ser: 1.15 mg/dL — ABNORMAL HIGH (ref 0.44–1.00)
GFR calc Af Amer: 57 mL/min — ABNORMAL LOW (ref >60)
GFR calc non Af Amer: 49 mL/min — ABNORMAL LOW (ref >60)
Glucose, Bld: 111 mg/dL — ABNORMAL HIGH (ref 70–99)
Potassium: 3.4 mmol/L — ABNORMAL LOW (ref 3.5–5.1)
Sodium: 141 mmol/L (ref 135–145)

## 2019-08-22 LAB — GLUCOSE, CAPILLARY
Glucose-Capillary: 129 mg/dL — ABNORMAL HIGH (ref 70–99)
Glucose-Capillary: 122 mg/dL — ABNORMAL HIGH (ref 70–99)

## 2019-08-22 LAB — HIV ANTIBODY (ROUTINE TESTING W REFLEX): HIV Screen 4th Generation wRfx: NONREACTIVE

## 2019-08-22 MED ORDER — CLONAZEPAM 0.5 MG PO TABS
0.5000 mg | ORAL_TABLET | Freq: Every day | ORAL | Status: DC
  Administered 2019-08-22 (×2): 0.5 mg via ORAL
  Filled 2019-08-22 (×2): qty 1

## 2019-08-22 MED ORDER — MAGNESIUM SULFATE 2 GM/50ML IV SOLN
2.0000 g | Freq: Once | INTRAVENOUS | Status: AC
  Administered 2019-08-22: 2 g via INTRAVENOUS
  Filled 2019-08-22: qty 50

## 2019-08-22 MED ORDER — WARFARIN SODIUM 5 MG PO TABS
5.0000 mg | ORAL_TABLET | Freq: Once | ORAL | Status: DC
  Filled 2019-08-22: qty 1

## 2019-08-22 MED ORDER — WARFARIN SODIUM 7.5 MG PO TABS
7.5000 mg | ORAL_TABLET | Freq: Once | ORAL | Status: AC
  Administered 2019-08-22: 7.5 mg via ORAL
  Filled 2019-08-22 (×2): qty 1

## 2019-08-22 MED ORDER — ACETAMINOPHEN 650 MG RE SUPP: 650.0000 mg | Freq: Four times a day (QID) | RECTAL | Status: DC | PRN

## 2019-08-22 MED ORDER — INSULIN ASPART 100 UNIT/ML ~~LOC~~ SOLN: 0.0000 [IU] | Freq: Three times a day (TID) | SUBCUTANEOUS | Status: DC

## 2019-08-22 MED ORDER — ACETAMINOPHEN 325 MG PO TABS: 650.0000 mg | ORAL_TABLET | Freq: Four times a day (QID) | ORAL | Status: DC | PRN

## 2019-08-22 MED ORDER — LAMOTRIGINE 100 MG PO TABS: 100.0000 mg | ORAL_TABLET | ORAL | Status: DC

## 2019-08-22 MED ORDER — POTASSIUM CHLORIDE CRYS ER 20 MEQ PO TBCR
40.0000 meq | EXTENDED_RELEASE_TABLET | Freq: Once | ORAL | Status: AC
  Administered 2019-08-22: 40 meq via ORAL
  Filled 2019-08-22: qty 2

## 2019-08-22 MED ORDER — METOPROLOL SUCCINATE ER 25 MG PO TB24
50.0000 mg | ORAL_TABLET | Freq: Every day | ORAL | Status: DC
  Administered 2019-08-22: 50 mg via ORAL
  Filled 2019-08-22 (×2): qty 2

## 2019-08-22 MED ORDER — WARFARIN - PHARMACIST DOSING INPATIENT: Freq: Every day | Status: DC

## 2019-08-22 MED ORDER — AMLODIPINE BESYLATE 5 MG PO TABS
5.0000 mg | ORAL_TABLET | Freq: Every day | ORAL | Status: DC
  Administered 2019-08-22 – 2019-08-23 (×2): 5 mg via ORAL
  Filled 2019-08-22 (×2): qty 1

## 2019-08-22 MED ORDER — FENOFIBRATE 160 MG PO TABS
160.0000 mg | ORAL_TABLET | Freq: Every day | ORAL | Status: DC
  Administered 2019-08-22 – 2019-08-23 (×2): 160 mg via ORAL
  Filled 2019-08-22 (×2): qty 1

## 2019-08-22 MED ORDER — INSULIN ASPART 100 UNIT/ML ~~LOC~~ SOLN
0.0000 [IU] | Freq: Three times a day (TID) | SUBCUTANEOUS | Status: DC
  Administered 2019-08-22: 1 [IU] via SUBCUTANEOUS

## 2019-08-22 MED ORDER — WARFARIN SODIUM 7.5 MG PO TABS
7.5000 mg | ORAL_TABLET | Freq: Once | ORAL | Status: AC
  Administered 2019-08-22: 7.5 mg via ORAL
  Filled 2019-08-22: qty 1

## 2019-08-22 MED ORDER — VENLAFAXINE HCL ER 75 MG PO CP24: 75.0000 mg | ORAL_CAPSULE | Freq: Every day | ORAL | Status: DC

## 2019-08-22 MED ORDER — LAMOTRIGINE 100 MG PO TABS
100.0000 mg | ORAL_TABLET | Freq: Every day | ORAL | Status: DC
  Administered 2019-08-22 – 2019-08-23 (×2): 100 mg via ORAL
  Filled 2019-08-22 (×2): qty 1

## 2019-08-22 MED ORDER — ROPINIROLE HCL 0.5 MG PO TABS
0.5000 mg | ORAL_TABLET | Freq: Every day | ORAL | Status: DC
  Administered 2019-08-22 (×2): 0.5 mg via ORAL
  Filled 2019-08-22 (×3): qty 1

## 2019-08-22 MED ORDER — QUETIAPINE FUMARATE 50 MG PO TABS
100.0000 mg | ORAL_TABLET | Freq: Every evening | ORAL | Status: DC
  Administered 2019-08-22: 100 mg via ORAL
  Filled 2019-08-22: qty 2

## 2019-08-22 MED ORDER — LAMOTRIGINE 100 MG PO TABS
200.0000 mg | ORAL_TABLET | Freq: Every day | ORAL | Status: DC
  Administered 2019-08-22 (×2): 200 mg via ORAL
  Filled 2019-08-22 (×3): qty 2

## 2019-08-22 MED ORDER — QUETIAPINE FUMARATE ER 300 MG PO TB24
600.0000 mg | ORAL_TABLET | Freq: Every evening | ORAL | Status: DC
  Administered 2019-08-22: 600 mg via ORAL
  Filled 2019-08-22: qty 2

## 2019-08-22 MED ORDER — OXCARBAZEPINE 150 MG PO TABS
150.0000 mg | ORAL_TABLET | Freq: Two times a day (BID) | ORAL | Status: DC
  Administered 2019-08-22 – 2019-08-23 (×4): 150 mg via ORAL
  Filled 2019-08-22 (×5): qty 1

## 2019-08-22 NOTE — ED Notes (Signed)
Pt's CBG result was 138. Informed Luellen Pucker - RN.

## 2019-08-22 NOTE — Telephone Encounter (Signed)
° °  [name] called, she said Pt is in the hospital due to syncope episode. She has some question to Dr. Tamala Julian and would like to discuss it with his nurse.  Please call

## 2019-08-22 NOTE — ED Notes (Signed)
Patient ambulated to bathroom with walk along assist.  Moved to hospital bed for comfort.  Updated on POC.  Lights dimmed

## 2019-08-22 NOTE — ED Notes (Signed)
Patient resting comfortably in bed.  On cell phone.  Denies any needs.  NAD.

## 2019-08-22 NOTE — Evaluation (Signed)
Physical Therapy Evaluation Patient Details Name: [name] MRN: [id number] DOB: [birth year] Today's Date: 08/22/2019   History of Present Illness  Pt is 69 yo female with PMH including mechanical aortic valve, afib, bipolar disorder, DM, kidney disease stage II.  Pt presented to ED after 2 falls getting in/out of bathtub.  On 1 fall hit posterior head.  Per chart possible near syncope, prolonged QTC on EKG and on Seroquel and Effexor.  Per cardiology notes, repeat TTE and possible 14 day event monitor.  CT head and Cspine were negative for acute changes (CT pelvis and spine also negative).  Pt reports she was told she has concussion.  Clinical Impression  Pt admitted with above diagnosis. Pt presenting with mild unsteadiness with mobility.  She had + [facility] on the right, additionally, with + orthostatic blood pressures.   Pt currently with functional limitations due to the deficits listed below (see PT Problem List). Pt will benefit from skilled PT to increase their independence and safety with mobility to allow discharge to the venue listed below.       Follow Up Recommendations Outpatient PT;Supervision - Intermittent(outpt PT vestibular)    Equipment Recommendations  None recommended by PT    Recommendations for Other Services       Precautions / Restrictions Precautions Precautions: Fall Precaution Comments: orthostatic BP      Mobility  Bed Mobility Overal bed mobility: Needs Assistance Bed Mobility: Supine to Sit;Sit to Supine     Supine to sit: Min assist Sit to supine: Supervision   General bed mobility comments: Min A for trunk elevation and steadying with transfers  Transfers Overall transfer level: Needs assistance Equipment used: None Transfers: Sit to/from [facility] Sit to Stand: Min guard Stand pivot transfers: Min guard       General transfer comment: min guard for safety  Ambulation/Gait Ambulation/Gait assistance: Min  guard Gait Distance (Feet): 80 Feet Assistive device: 1 person hand held assist Gait Pattern/deviations: Decreased stride length Gait velocity: decreased   General Gait Details: No LOB but mild unsteadiness; pt reports feels more unstable than normal  Stairs            Wheelchair Mobility    Modified Rankin (Stroke Patients Only)       Balance Overall balance assessment: Needs assistance Sitting-balance support: No upper extremity supported;Feet supported Sitting balance-Leahy Scale: Good     Standing balance support: No upper extremity supported;During functional activity Standing balance-Leahy Scale: Fair                               Pertinent Vitals/Pain Pain Assessment: 0-10 Pain Score: 6  Pain Location: head Pain Descriptors / Indicators: Aching Pain Intervention(s): Limited activity within patient's tolerance    Home Living Family/patient expects to be discharged to:: Private residence Living Arrangements: Children;Other (Comment)(dtr, sil, grandkids) Available Help at Discharge: Family;Available 24 hours/day Type of Home: House Home Access: Level entry     Home Layout: Multi-level;Other (Comment);Able to live on main level with bedroom/bathroom(lives in basement - can get in without doing steps) Home Equipment: Shower seat;Walker - 4 wheels;Cane - single point Additional Comments: plans to get grab bars    Prior Function Level of Independence: Independent   Gait / Transfers Assistance Needed: community ambulation without AD  ADL's / Homemaking Assistance Needed: Reports independent with ADLs, IADL, and driving  Comments: Reports falls prior to TKA but none since then  until the past week had 2 falls in shower - not sure if she passed out or not     Hand Dominance        Extremity/Trunk Assessment   Upper Extremity Assessment Upper Extremity Assessment: Overall WFL for tasks assessed    Lower Extremity Assessment Lower  Extremity Assessment: Overall WFL for tasks assessed    Cervical / Trunk Assessment Cervical / Trunk Assessment: Normal  Communication   Communication: No difficulties  Cognition Arousal/Alertness: Awake/alert Behavior During Therapy: WFL for tasks assessed/performed Overall Cognitive Status: Within Functional Limits for tasks assessed                                        General Comments   Orthostatic BPs  Supine 131/71  Sitting 127/72     Standing 113/80  Standing after 3 min Sitting after walk 109/71 133/90  HR and O2 sats stable  Vestibular: Pt reports that she has been told she has a concussion from the fall in which she hit her posterior head.  Pt reporting headache and just feeling "crummy".   +Dizziness when turned to look at therapist on right Mild dizziness with transfers Mild dizziness, no nystagmus with smooth pursuit - smooth pursuit intact No dizziness with gaze stabilization and intact  Severe dizziness with [place] on Right :  Pt had difficulty keeping eyes open so unable to assess nystagmus, however, + symptoms that eased after 30sec to 1 min in each position.  Treated with Epley's maneuvar.  Negative [facility] on Left.  Pt was educated on safety and balance with concussion and + BPPV testing and + orthostatic BP.  Educated on BPPV and vestibular rehab.  Discussed compensation techniques of segmental turning and picking a point to focus on when dizzy.    Gave HEP: Access Code: [email or web address] URL: [email or web address] Date: 08/22/2019 Prepared by: Maggie Font Exercises Seated Horizontal Smooth Pursuit - 3 x daily - 7 x weekly - 1 sets - 10 reps Seated Vertical Smooth Pursuit - 3 x daily - 7 x weekly - 1 sets - 10 reps Seated Gaze Stabilization with Head Rotation - 3 x daily - 7 x weekly - 1 sets - 10 reps Seated Gaze Stabilization with Head Nod - 3 x daily - 7 x weekly - 1 sets - 10 reps Self-Epley Maneuver Right  Ear - 3 x daily - 7 x weekly - 1 sets - 10 reps      Exercises     Assessment/Plan    PT Assessment Patient needs continued PT services  PT Problem List Decreased strength;Decreased mobility;Decreased coordination;Decreased activity tolerance;Decreased balance;Decreased knowledge of use of DME       PT Treatment Interventions DME instruction;Therapeutic exercise;Gait training;Balance training;Stair training;Neuromuscular re-education;Functional mobility training;Therapeutic activities;Patient/family education;Other (comment)(BPPV treatment R ; habituation exercises)    PT Goals (Current goals can be found in the Care Plan section)  Acute Rehab PT Goals Patient Stated Goal: return home; feel better PT Goal Formulation: With patient Time For Goal Achievement: 09/05/19 Potential to Achieve Goals: Good    Frequency Min 3X/week   Barriers to discharge        Co-evaluation               AM-PAC PT "6 Clicks" Mobility  Outcome Measure Help needed turning from your back to your side while in a flat bed without using bedrails?:  A Little Help needed moving from lying on your back to sitting on the side of a flat bed without using bedrails?: A Little Help needed moving to and from a bed to a chair (including a wheelchair)?: A Little Help needed standing up from a chair using your arms (e.g., wheelchair or bedside chair)?: A Little Help needed to walk in hospital room?: A Little Help needed climbing 3-5 steps with a railing? : A Little 6 Click Score: 18    End of Session Equipment Utilized During Treatment: Gait belt Activity Tolerance: Patient tolerated treatment well Patient left: in chair;with call bell/phone within reach(in ED no alarm available - pt following commands and calling for assist) Nurse Communication: Mobility status PT Visit Diagnosis: Unsteadiness on feet (R26.81);History of falling (Z91.81);BPPV BPPV - Right/Left : Right    Time: [id number] PT Time  Calculation (min) (ACUTE ONLY): 50 min   Charges:   PT Evaluation $PT Eval Moderate Complexity: 1 Mod PT Treatments $[place]ith Rep Proc: 8-22 mins        Maggie Font, PT Acute Rehab Services Pager [phone] [facility] [phone] [facility] [phone]   Karlton Lemon 08/22/2019, 2:22 PM

## 2019-08-22 NOTE — Progress Notes (Signed)
Progress Note  Patient Name: [name] Date of Encounter: 08/22/2019  Primary Cardiologist: Sinclair Grooms, MD   Subjective   Pt denies dizziness   Breathing is OK   No CP    Inpatient Medications    Scheduled Meds: . amLODipine  5 mg Oral Daily  . clonazePAM  0.5 mg Oral QHS  . fenofibrate  160 mg Oral Daily  . insulin aspart  0-9 Units Subcutaneous TID WC  . lamoTRIgine  100 mg Oral Daily   And  . lamoTRIgine  200 mg Oral QHS  . metoprolol succinate  50 mg Oral Daily  . OXcarbazepine  150 mg Oral BID  . QUEtiapine  600 mg Oral QPM  . QUEtiapine  100 mg Oral QPM  . rOPINIRole  0.5 mg Oral QHS  . warfarin  7.5 mg Oral ONCE-1600  . Warfarin - Pharmacist Dosing Inpatient   Does not apply q1800   Continuous Infusions: . magnesium sulfate bolus IVPB     PRN Meds: acetaminophen **OR** acetaminophen   Vital Signs    Vitals:   08/22/19 1240 08/22/19 1248 08/22/19 1332 08/22/19 1400  BP: 109/71 133/90 (!) 142/102 (!) 121/59  Pulse: 86 99 99 74  Resp: 15 (!) 28 17 (!) 21  Temp:      TempSrc:      SpO2: 93% 94% 94% 93%  Weight:      Height:        Intake/Output Summary (Last 24 hours) at 08/22/2019 1544 Last data filed at 08/22/2019 1302 Gross per 24 hour  Intake 50 ml  Output --  Net 50 ml   Last 3 Weights 08/21/2019 12/22/2018 12/14/2018  Weight (lbs) 225 lb 250 lb 242 lb 12.8 oz  Weight (kg) 102.059 kg 113.399 kg 110.133 kg      Telemetry    SR   - Personally Reviewed  ECG    08/22/19:  Atrial fibrillation  71 bpm   - Personally Reviewed  Physical Exam   GEN: No acute distress.   Neck: No JVD Cardiac: Irreg irreg, no murmurs, rubs, or gallops.  Respiratory: Clear to auscultation bilaterally. GI: Soft, nontender, non-distended  MS: No edema; No deformity. Neuro:  Nonfocal  Psych: Normal affect   Labs    High Sensitivity Troponin:  No results for input(s): TROPONINIHS in the last 720 hours.    Chemistry Recent Labs  Lab 08/21/19 1236  08/21/19 1250 08/22/19 0435  NA 141  --  141  K 3.7  --  3.4*  CL 98  --  98  CO2 27  --  29  GLUCOSE 133*  --  111*  BUN 10  --  10  CREATININE 1.10* 1.00 1.15*  CALCIUM 9.4  --  8.8*  PROT 7.5  --   --   ALBUMIN 4.2  --   --   AST 25  --   --   ALT 24  --   --   ALKPHOS 55  --   --   BILITOT 1.0  --   --   GFRNONAA 52*  --  49*  GFRAA 60*  --  57*  ANIONGAP 16*  --  14     Hematology Recent Labs  Lab 08/21/19 1236 08/22/19 0435  WBC 8.1 10.8*  RBC 4.43 4.20  HGB 13.0 12.2  HCT 40.4 39.2  MCV 91.2 93.3  MCH 29.3 29.0  MCHC 32.2 31.1  RDW 14.5 14.7  PLT 257 246  BNPNo results for input(s): BNP, PROBNP in the last 168 hours.   DDimer No results for input(s): DDIMER in the last 168 hours.   Radiology    CT Angio Head W or Wo Contrast  Result Date: 08/21/2019 CLINICAL DATA:  Vertigo. EXAM: CT ANGIOGRAPHY HEAD AND NECK TECHNIQUE: Multidetector CT imaging of the head and neck was performed using the standard protocol during bolus administration of intravenous contrast. Multiplanar CT image reconstructions and MIPs were obtained to evaluate the vascular anatomy. Carotid stenosis measurements (when applicable) are obtained utilizing NASCET criteria, using the distal internal carotid diameter as the denominator. CONTRAST:  13mL OMNIPAQUE IOHEXOL 350 MG/ML SOLN COMPARISON:  None. FINDINGS: CTA NECK FINDINGS Aortic arch: Partially visualized postsurgical changes of ascending aortic repair. Standard 3 vessel aortic arch with less than 50% narrowing of the brachiocephalic artery origin. Right carotid system: Patent with minimal calcified plaque at the carotid bifurcation. No evidence of stenosis or dissection. Tortuous mid cervical ICA. Left carotid system: Patent with minimal calcified plaque at the carotid bifurcation. No evidence of stenosis or dissection. Vertebral arteries: Patent without evidence of stenosis or dissection. Strongly dominant left vertebral artery. Skeleton:  Cervical disc degeneration with advanced disc space narrowing and spurring at C5-6. Multilevel facet arthrosis, severe on the right at C7-T1. Other neck: No evidence of cervical lymphadenopathy or mass. Upper chest: Mild biapical pleuroparenchymal scarring. Review of the MIP images confirms the above findings CTA HEAD FINDINGS Anterior circulation: The internal carotid arteries are patent from skull base to carotid termini with mild nonstenotic plaque bilaterally. ACAs and MCAs are patent without evidence of proximal branch occlusion or significant proximal stenosis. No aneurysm is identified. Posterior circulation: The intracranial vertebral arteries are patent with scattered atherosclerotic plaque bilaterally resulting and mild stenosis on the right. Patent PICAs and SCAs are seen bilaterally. The basilar artery is widely patent. There is a small left posterior communicating artery. Both PCAs are patent without evidence of significant proximal stenosis. No aneurysm is identified. Venous sinuses: Patent. Anatomic variants: None. Review of the MIP images confirms the above findings IMPRESSION: Mild atherosclerosis in the head and neck without large vessel occlusion or significant stenosis. Electronically Signed   By: Logan Bores M.D.   On: 08/21/2019 17:47   DG Ribs Unilateral W/Chest Left  Result Date: 08/21/2019 CLINICAL DATA:  Rib pain since a fall out of the bathtub this morning. EXAM: LEFT RIBS AND CHEST - 3+ VIEW COMPARISON:  Chest x-ray dated 04/13/2015 FINDINGS: No fracture or other bone lesions are seen involving the ribs. There is no evidence of pneumothorax or pleural effusion. Both lungs are clear. Heart size and mediastinal contours are within normal limits. Prosthetic aortic valve. IMPRESSION: Negative. Electronically Signed   By: Lorriane Shire M.D.   On: 08/21/2019 13:41   DG Lumbar Spine 2-3 Views  Result Date: 08/21/2019 CLINICAL DATA:  Fall out of bathtub this morning. Low back pain.  EXAM: LUMBAR SPINE - 2-3 VIEW COMPARISON:  12/18/2014 lumbar spine radiographs FINDINGS: This report assumes 5 non rib-bearing lumbar vertebrae, with diminutive ribs at T12. Mild rotatory dextrocurvature of the lumbar spine. Cholecystectomy clips are seen in the right upper quadrant of the abdomen. Lumbar vertebral body heights are preserved, with no fracture. Moderate multilevel lumbar degenerative disc disease, most prominent at L4-5. No spondylolisthesis. Mild lower lumbar facet arthropathy. No aggressive appearing focal osseous lesions. Abdominal aortic atherosclerosis. IMPRESSION: No lumbar spine fracture or spondylolisthesis. Moderate multilevel lumbar degenerative disc disease. Electronically Signed   By: Corene Cornea  A Poff M.D.   On: 08/21/2019 13:42   DG Pelvis 1-2 Views  Result Date: 08/21/2019 CLINICAL DATA:  Fall out of bathtub this morning with low back pain EXAM: PELVIS - 1-2 VIEW COMPARISON:  10/16/2015 CT abdomen/pelvis FINDINGS: No pelvic fracture or diastasis. No evidence of hip dislocation on this single frontal view. No suspicious focal osseous lesions. Surgical clip overlies the left iliac wing. Degenerative changes in the visualized lower lumbar spine. IMPRESSION: No pelvic fracture. Electronically Signed   By: Ilona Sorrel M.D.   On: 08/21/2019 13:39   CT Head Wo Contrast  Result Date: 08/21/2019 CLINICAL DATA:  Head trauma with posterior scalp hematoma secondary to a fall in the bathtub. The patient takes Coumadin. Loss of consciousness before the fall. EXAM: CT HEAD WITHOUT CONTRAST CT CERVICAL SPINE WITHOUT CONTRAST TECHNIQUE: Multidetector CT imaging of the head and cervical spine was performed following the standard protocol without intravenous contrast. Multiplanar CT image reconstructions of the cervical spine were also generated. COMPARISON:  CT scan of the head dated 04/06/2014 FINDINGS: CT HEAD FINDINGS Brain: No evidence of acute infarction, hemorrhage, hydrocephalus, extra-axial  collection or mass lesion/mass effect. There are a few tiny areas of periventricular white matter lucency consistent with chronic small vessel ischemic disease, unchanged since the prior study. Slight asymmetry of the lateral ventricles, stable. Vascular: No hyperdense vessel or unexpected calcification. Skull: Normal. Negative for fracture or focal lesion. Sinuses/Orbits: Normal. Other: None CT CERVICAL SPINE FINDINGS Alignment: Cervical alignment is normal. 2 mm spondylolisthesis of T2 on T3 felt to be due to bilateral facet arthritis. Skull base and vertebrae: No acute fracture. No primary bone lesion or focal pathologic process. Soft tissues and spinal canal: No prevertebral fluid or swelling. No visible canal hematoma. Disc levels: C2-3 and C3-4: Normal discs. Minimal degenerative changes of the facet joints. C4-5: Tiny central disc bulge with no neural impingement. Slight degenerative changes of the left facet joint. C5-6: Marked degenerative disc disease with disc space narrowing and prominent degenerative changes of the vertebral endplates. Broad-based disc osteophyte complex without soft disc protrusion. No focal neural impingement. No significant foraminal stenosis. No facet arthritis. C6-7: Disc space narrowing. Broad-based disc osteophyte complex without neural impingement. Widely patent neural foramina. Normal facet joints. C7-T1: Normal disc. Moderately severe left facet arthritis. No foraminal stenosis. T1-2: Normal. T2-3: Moderate right and mild left facet arthritis. 2 mm spondylolisthesis of T2 on T3 without disc bulging or disc protrusion. T3-4 and T4-5: Normal discs. Moderate right facet arthritis at each level. Upper chest: Emphysematous changes in both lung apices. Other: None IMPRESSION: 1. No acute intracranial abnormality. Minimal chronic small vessel ischemic changes in the periventricular white matter. 2. No acute abnormality of the cervical spine. Multilevel degenerative disc and joint  disease. Electronically Signed   By: Lorriane Shire M.D.   On: 08/21/2019 13:24   CT Angio Neck W and/or Wo Contrast  Result Date: 08/21/2019 CLINICAL DATA:  Vertigo. EXAM: CT ANGIOGRAPHY HEAD AND NECK TECHNIQUE: Multidetector CT imaging of the head and neck was performed using the standard protocol during bolus administration of intravenous contrast. Multiplanar CT image reconstructions and MIPs were obtained to evaluate the vascular anatomy. Carotid stenosis measurements (when applicable) are obtained utilizing NASCET criteria, using the distal internal carotid diameter as the denominator. CONTRAST:  103mL OMNIPAQUE IOHEXOL 350 MG/ML SOLN COMPARISON:  None. FINDINGS: CTA NECK FINDINGS Aortic arch: Partially visualized postsurgical changes of ascending aortic repair. Standard 3 vessel aortic arch with less than 50% narrowing of  the brachiocephalic artery origin. Right carotid system: Patent with minimal calcified plaque at the carotid bifurcation. No evidence of stenosis or dissection. Tortuous mid cervical ICA. Left carotid system: Patent with minimal calcified plaque at the carotid bifurcation. No evidence of stenosis or dissection. Vertebral arteries: Patent without evidence of stenosis or dissection. Strongly dominant left vertebral artery. Skeleton: Cervical disc degeneration with advanced disc space narrowing and spurring at C5-6. Multilevel facet arthrosis, severe on the right at C7-T1. Other neck: No evidence of cervical lymphadenopathy or mass. Upper chest: Mild biapical pleuroparenchymal scarring. Review of the MIP images confirms the above findings CTA HEAD FINDINGS Anterior circulation: The internal carotid arteries are patent from skull base to carotid termini with mild nonstenotic plaque bilaterally. ACAs and MCAs are patent without evidence of proximal branch occlusion or significant proximal stenosis. No aneurysm is identified. Posterior circulation: The intracranial vertebral arteries are patent  with scattered atherosclerotic plaque bilaterally resulting and mild stenosis on the right. Patent PICAs and SCAs are seen bilaterally. The basilar artery is widely patent. There is a small left posterior communicating artery. Both PCAs are patent without evidence of significant proximal stenosis. No aneurysm is identified. Venous sinuses: Patent. Anatomic variants: None. Review of the MIP images confirms the above findings IMPRESSION: Mild atherosclerosis in the head and neck without large vessel occlusion or significant stenosis. Electronically Signed   By: Logan Bores M.D.   On: 08/21/2019 17:47   CT Cervical Spine Wo Contrast  Result Date: 08/21/2019 CLINICAL DATA:  Head trauma with posterior scalp hematoma secondary to a fall in the bathtub. The patient takes Coumadin. Loss of consciousness before the fall. EXAM: CT HEAD WITHOUT CONTRAST CT CERVICAL SPINE WITHOUT CONTRAST TECHNIQUE: Multidetector CT imaging of the head and cervical spine was performed following the standard protocol without intravenous contrast. Multiplanar CT image reconstructions of the cervical spine were also generated. COMPARISON:  CT scan of the head dated 04/06/2014 FINDINGS: CT HEAD FINDINGS Brain: No evidence of acute infarction, hemorrhage, hydrocephalus, extra-axial collection or mass lesion/mass effect. There are a few tiny areas of periventricular white matter lucency consistent with chronic small vessel ischemic disease, unchanged since the prior study. Slight asymmetry of the lateral ventricles, stable. Vascular: No hyperdense vessel or unexpected calcification. Skull: Normal. Negative for fracture or focal lesion. Sinuses/Orbits: Normal. Other: None CT CERVICAL SPINE FINDINGS Alignment: Cervical alignment is normal. 2 mm spondylolisthesis of T2 on T3 felt to be due to bilateral facet arthritis. Skull base and vertebrae: No acute fracture. No primary bone lesion or focal pathologic process. Soft tissues and spinal canal: No  prevertebral fluid or swelling. No visible canal hematoma. Disc levels: C2-3 and C3-4: Normal discs. Minimal degenerative changes of the facet joints. C4-5: Tiny central disc bulge with no neural impingement. Slight degenerative changes of the left facet joint. C5-6: Marked degenerative disc disease with disc space narrowing and prominent degenerative changes of the vertebral endplates. Broad-based disc osteophyte complex without soft disc protrusion. No focal neural impingement. No significant foraminal stenosis. No facet arthritis. C6-7: Disc space narrowing. Broad-based disc osteophyte complex without neural impingement. Widely patent neural foramina. Normal facet joints. C7-T1: Normal disc. Moderately severe left facet arthritis. No foraminal stenosis. T1-2: Normal. T2-3: Moderate right and mild left facet arthritis. 2 mm spondylolisthesis of T2 on T3 without disc bulging or disc protrusion. T3-4 and T4-5: Normal discs. Moderate right facet arthritis at each level. Upper chest: Emphysematous changes in both lung apices. Other: None IMPRESSION: 1. No acute intracranial abnormality. Minimal  chronic small vessel ischemic changes in the periventricular white matter. 2. No acute abnormality of the cervical spine. Multilevel degenerative disc and joint disease. Electronically Signed   By: Lorriane Shire M.D.   On: 08/21/2019 13:24    Cardiac Studies   Pending echo   Previous cardiac testing: Echocardiogram 12/28/2018 1. The left ventricle has normal systolic function, with an ejection  fraction of 55-60%. The cavity size was normal. Left ventricular diastolic  Doppler parameters are consistent with pseudonormalization.  2. The right ventricle has normal systolic function. The cavity was  normal. There is no increase in right ventricular wall thickness.  3. Left atrial size was mildly dilated.  4. Right atrial size was mildly dilated.  5. There is mild mitral annular calcification present. No  evidence of  mitral valve stenosis.  6. The tricuspid valve is grossly normal.  7. S/P mechanical AV replacement.  8. The aorta is normal in size and structure.  9. The aortic root and ascending aorta are normal in size and structure.  10. Grossly normal.   FINDINGS  Left Ventricle: The left ventricle has normal systolic function, with an  ejection fraction of 55-60%. The cavity size was normal. There is no  increase in left ventricular wall thickness. Left ventricular diastolic  Doppler parameters are consistent with  pseudonormalization.   Patient Profile     69 y.o. female with hx of AV dz( s/p mechanical AVR in 2011), HTN, HL, atrial fibrillation (rate controlled) who presents after fall  Asked to see re abnormal EKG  Assessment & Plan    1.  Abnormal EKG   Pt's EKG today , QT does not appear to be excessively long.  Yesterdays EKG diffiuclt as T wave very low voltage.  I am not convinced it is excessvely long   But, I  would avoid agents that can prolong   Replete electrolytes   Keep on telemetry Note Echo pending   2  Atrial fbrillation   COntinue telemetry.  COntinue rate control  COntinue anticoagulation  3  AV dz.   S/P AVR   On exam, crisp valve sounds   Agree with echo to reevaluate   4  HTN  FOllow BP on medical Rx  5  HL  Check lpids in AM    For questions or updates, please contact [facility] Please consult [email or web address] for contact info under        Signed, Dorris Carnes, MD  08/22/2019, 3:44 PM

## 2019-08-22 NOTE — H&P (Addendum)
History and Physical    [name] [id number] DOB: 1950-08-29 DOA: 08/21/2019  PCP: Mosie Lukes, MD  Patient coming from: Home.  Chief Complaint: Fall.  HPI: [name] is a 69 y.o. female with history of mechanical aortic valve, A. fib, bipolar disorder, diabetes mellitus, chronic kidney disease stage II, hyperlipidemia presents to the ER the patient had a second fall in the last 1 week.  Patient states her first fall was about the early part of the last week when patient slipped from the tub and fell.  Second 1 was yesterday when patient again fell on the bathtub.  This time she fell backwards and hit her head.  Patient states she is not sure of the cause for the fall she feels she may have lost her balance.  Both the time she did not lose consciousness.  Has not had any chest pain or shortness of breath or palpitation prior or after the fall.  Since the fall patient felt mildly nauseous and also occipital headache.  Patient states over the last 2 weeks patient has been having some diarrhea 1 or 2 episodes.  Denies any sick contacts or using any antibiotics.  5 days ago patient had some abdominal pain which is completely resolved.  At the time patient declined coming to the ER.  ED Course: In the ER EKG shows A. fib with prolonged QTC of 653 ms.  Patient had CT head C-spine CT angiogram head and neck x-ray of the pelvis all of which does not show any acute.  Labs show creatinine of 1.1-in 3.7 magnesium 1.7 CBC unremarkable INR 2 Covid test negative UA shows moderate leukocytes and WBCs 6-10.  Cardiology was notified since patient had prolonged QTC with falls.  At this time cardiology recommended observation given the prolonged QTC.  Orthostatic is pending.  Review of Systems: As per HPI, rest all negative.   Past Medical History:  Diagnosis Date  . ANEMIA 05/28/2010  . AORTIC VALVE REPLACEMENT, HX OF 03/11/2010   Mechanical prosthesis  . BIPOLAR AFFECTIVE DISORDER 03/11/2010  .  Depression   . Diverticulitis 12/18/2010  . FIBROIDS, UTERUS 03/11/2010  . Gout 09/04/2016  . Grave's disease 8-12  . Hyperlipidemia associated with type 2 diabetes mellitus ([place]) 06/05/2016  . Hypertension   . Low back pain 11/02/2017  . Migraine 06/05/2016  . Mixed hyperlipidemia 03/11/2010  . Obesity   . Skin cancer   . UTI (lower urinary tract infection) 08/14/2011    Past Surgical History:  Procedure Laterality Date  . ABDOMINAL HYSTERECTOMY  ?1998   sb/l spo, total for fibroids/heavy bleeding  . ANKLE SURGERY Left 01/2018   hardware removal  . AORTIC VALVE REPLACEMENT  2011   Mechanical prosthesis, [employer]  . APPENDECTOMY     Required revision for EColi infection, required recurrent packing  . bowel obstruction     Requiring adhesions to be removed  . CHOLECYSTECTOMY    . COLONOSCOPY WITH PROPOFOL N/A 11/01/2015   Procedure: COLONOSCOPY WITH PROPOFOL;  Surgeon: Milus Banister, MD;  Location: [facility];  Service: Endoscopy;  Laterality: N/A;  . CYSTOCELE REPAIR N/A 10/16/2015   Procedure: ANTERIOR REPAIR (CYSTOCELE);  Surgeon: Donnamae Jude, MD;  Location: [facility];  Service: Gynecology;  Laterality: N/A;  . HERNIA REPAIR  08-16-10  . LEFT HEART CATHETERIZATION WITH CORONARY ANGIOGRAM N/A 12/05/2011   Procedure: LEFT HEART CATHETERIZATION WITH CORONARY ANGIOGRAM;  Surgeon: Sinclair Grooms, MD;  Location: [facility];  Service: Cardiovascular;  Laterality: N/A;  . TOTAL KNEE ARTHROPLASTY Right 02/24/2013   Procedure: RIGHT TOTAL KNEE ARTHROPLASTY;  Surgeon: Johnn Hai, MD;  Location: [facility];  Service: Orthopedics;  Laterality: Right;  . TUBAL LIGATION    . varicose vein surgery b/l legs       reports that she quit smoking about 29 years ago. She has never used smokeless tobacco. She reports current alcohol use. She reports that she does not use drugs.  Allergies  Allergen Reactions  . Mucinex [Guaifenesin Er] Other (See Comments)    Severe headaches   . Keflex  [Cephalexin] Other (See Comments)    Headaches and dizziness  . Sulfonamide Derivatives Other (See Comments)    Headaches     Family History  Problem Relation Age of Onset  . Scoliosis Mother   . Arthritis Mother        Rheumatoid  . Aneurysm Mother        heart  . Alcohol abuse Father   . Depression Sister   . Depression Brother   . Alcohol abuse Brother   . Cancer Maternal Grandmother        colon  . Diabetes Maternal Grandfather   . Heart disease Maternal Grandfather   . Alcohol abuse Maternal Grandfather   . Depression Paternal Grandmother   . Diabetes Paternal Grandmother   . Depression Paternal Grandfather   . Diabetes Paternal Grandfather   . Depression Sister   . Alcohol abuse Brother   . Depression Brother   . Heart disease Brother     Prior to Admission medications   Medication Sig Start Date End Date Taking? Authorizing Provider  acetaminophen (TYLENOL) 500 MG tablet Take 1,000-1,500 mg by mouth daily as needed (for knee pain).    Yes [provider]  amLODipine (NORVASC) 5 MG tablet Take 1 tablet (5 mg total) by mouth daily. 03/03/19  Yes Belva Crome, MD  aspirin EC 81 MG tablet Take 81 mg by mouth daily.   Yes [provider]  BIOTIN PO Take 1 tablet by mouth daily with breakfast.   Yes [provider]  clonazePAM (KLONOPIN) 0.5 MG tablet Take 1 tablet (0.5 mg total) by mouth at bedtime. 03/27/19 08/21/19 Yes Thayer Headings, PMHNP  fenofibrate micronized (ANTARA) 130 MG capsule Take 1 capsule (130 mg total) by mouth daily before breakfast. 03/19/18  Yes Mosie Lukes, MD  furosemide (LASIX) 40 MG tablet Take 1 tablet (40 mg total) by mouth daily. 06/03/19  Yes Belva Crome, MD  lamoTRIgine (LAMICTAL) 100 MG tablet Take 100 mg by mouth in the morning and 200 mg by mouth at bedtime Patient taking differently: Take 100-200 mg by mouth See admin instructions. Take 100 mg by mouth in the morning and 200 mg by mouth at bedtime 08/17/19   Yes Thayer Headings, PMHNP  metFORMIN (GLUCOPHAGE XR) 500 MG 24 hr tablet Take 2 tablets (1,000 mg total) by mouth 2 (two) times daily with a meal. Patient taking differently: Take 500 mg by mouth in the morning and at bedtime.  06/29/19  Yes Shamleffer, Melanie Crazier, MD  metoprolol succinate (TOPROL-XL) 50 MG 24 hr tablet Take 1 tablet (50 mg total) by mouth daily. Take with or immediately following a meal. 12/14/18 08/21/19 Yes Belva Crome, MD  Multiple Vitamins-Minerals (CENTRUM SILVER 50+WOMEN PO) Take 1 tablet by mouth daily with breakfast.   Yes [provider]  OXcarbazepine (TRILEPTAL) 150 MG tablet Take 1 tablet (150 mg  total) by mouth 2 (two) times daily. 06/21/19 09/19/19 Yes Thayer Headings, PMHNP  pantoprazole (PROTONIX) 40 MG tablet Take 1 tablet (40 mg total) by mouth daily. 08/27/18  Yes Mosie Lukes, MD  QUEtiapine (SEROQUEL XR) 300 MG 24 hr tablet Take 2 tablets (600 mg total) by mouth every evening. 06/21/19 09/19/19 Yes Thayer Headings, PMHNP  QUEtiapine (SEROQUEL) 100 MG tablet Take 1/2-1 tab po QHS Patient taking differently: Take 100 mg by mouth every evening.  03/04/19  Yes Thayer Headings, PMHNP  rOPINIRole (REQUIP) 0.25 MG tablet Take 1-2 tablets (0.25-0.5 mg total) by mouth at bedtime. Patient taking differently: Take 0.5 mg by mouth at bedtime.  06/21/19  Yes Mosie Lukes, MD  SUMAtriptan (IMITREX) 50 MG tablet TAKE ONE TABLET EVERY 2 HOURS AS NEEDED FOR MIGRAINE. MAY REPEAT IN 2 HOURS IF HEADACHE PERSISTS OR RECURS. (MAX OF 2 TABLETS IN 24 HOURS) Patient taking differently: Take 50 mg by mouth every 2 (two) hours as needed for migraine or headache (and may repeat once in 2 hours, if no relief (MAX 2 tablets/24 hours)).  12/01/17  Yes Mosie Lukes, MD  traMADol (ULTRAM) 50 MG tablet Take 50 mg by mouth every 6 (six) hours as needed (for pain).    Yes [provider]  venlafaxine XR (EFFEXOR XR) 75 MG 24 hr capsule Take 1 capsule (75 mg total) by mouth  daily with breakfast. 08/17/19 11/15/19 Yes Thayer Headings, PMHNP  warfarin (COUMADIN) 5 MG tablet Take 5mg  daily except 2.5mg  on Monday and Wednesday or take as directed by [facility]. Patient taking differently: Take 5-7.5 mg by mouth See admin instructions. Take 5 mg by mouth in the evening on Sun/Mon/Tues/Wed/Sat and 7.5 mg on Thurs/Fri 05/05/19  Yes Belva Crome, MD  enoxaparin (LOVENOX) 120 MG/0.8ML injection Inject 0.8 mLs (120 mg total) into the skin every 12 (twelve) hours. As directed by coumadin clinic Patient not taking: Reported on 08/21/2019 12/28/18   Constance Haw, MD  glucose blood [facility] VERIO) test strip Check blood sugars once daily Dx: E11.9 10/12/18   Mosie Lukes, MD  Lancets [facility] ULTRASOFT) lancets Use as instructed to test blood sugars once daily Dx. E11.65 08/18/18   Shamleffer, Melanie Crazier, MD  ONE TOUCH LANCETS MISC Test once daily to check blood sugar. DX E11.9 04/26/15   Mosie Lukes, MD  rosuvastatin (CRESTOR) 40 MG tablet Take 1 tablet (40 mg total) by mouth daily. Patient not taking: Reported on 08/21/2019 06/21/19   Mosie Lukes, MD  methimazole (TAPAZOLE) 5 MG tablet Take 1 tablet (5 mg total) by mouth 3 (three) times daily. 06/24/11 08/05/11  Renato Shin, MD    Physical Exam: Constitutional: Moderately built and nourished. Vitals:   08/21/19 2215 08/21/19 2230 08/21/19 2245 08/21/19 2300  BP: 129/65 134/69 130/66 139/78  Pulse: 73 72 69 62  Resp: 17 (!) 24 19 16   Temp:      TempSrc:      SpO2: 92% 92% 93% 95%  Weight:      Height:       Eyes: Anicteric no pallor. ENMT: No discharge from the ears eyes nose or mouth. Neck: No mass felt.  No neck rigidity. Respiratory: No rhonchi or crepitations. Cardiovascular: S1-S2 heard. Abdomen: Soft nontender bowel sound present. Musculoskeletal: No edema. Skin: No rash. Neurologic: Alert awake oriented to time place and person.  Moves all extremities. Psychiatric: Appears normal with  normal affect.   Labs on Admission: I have personally  reviewed following labs and imaging studies  CBC: Recent Labs  Lab 08/21/19 1236  WBC 8.1  NEUTROABS 6.1  HGB 13.0  HCT 40.4  MCV 91.2  PLT 99991111   Basic Metabolic Panel: Recent Labs  Lab 08/21/19 1236 08/21/19 1250  NA 141  --   K 3.7  --   CL 98  --   CO2 27  --   GLUCOSE 133*  --   BUN 10  --   CREATININE 1.10* 1.00  CALCIUM 9.4  --   MG 1.7  --    GFR: Estimated Creatinine Clearance: 68.5 mL/min (by C-G formula based on SCr of 1 mg/dL). Liver Function Tests: Recent Labs  Lab 08/21/19 1236  AST 25  ALT 24  ALKPHOS 55  BILITOT 1.0  PROT 7.5  ALBUMIN 4.2   Recent Labs  Lab 08/21/19 1236  LIPASE 33   No results for input(s): AMMONIA in the last 168 hours. Coagulation Profile: Recent Labs  Lab 08/19/19 1428 08/21/19 1236  INR 1.9* 2.0*   Cardiac Enzymes: No results for input(s): CKTOTAL, CKMB, CKMBINDEX, TROPONINI in the last 168 hours. BNP (last 3 results) No results for input(s): PROBNP in the last 8760 hours. HbA1C: No results for input(s): HGBA1C in the last 72 hours. CBG: No results for input(s): GLUCAP in the last 168 hours. Lipid Profile: No results for input(s): CHOL, HDL, LDLCALC, TRIG, CHOLHDL, LDLDIRECT in the last 72 hours. Thyroid Function Tests: No results for input(s): TSH, T4TOTAL, FREET4, T3FREE, THYROIDAB in the last 72 hours. Anemia Panel: No results for input(s): VITAMINB12, FOLATE, FERRITIN, TIBC, IRON, RETICCTPCT in the last 72 hours. Urine analysis:    Component Value Date/Time   COLORURINE STRAW (A) 08/21/2019 1423   APPEARANCEUR CLEAR 08/21/2019 1423   LABSPEC 1.004 (L) 08/21/2019 1423   PHURINE 6.0 08/21/2019 1423   GLUCOSEU NEGATIVE 08/21/2019 1423   GLUCOSEU NEGATIVE 11/02/2017 1428   HGBUR NEGATIVE 08/21/2019 [place] 08/21/2019 1423   BILIRUBINUR neg 07/18/2015 1031   KETONESUR NEGATIVE 08/21/2019 1423   PROTEINUR NEGATIVE 08/21/2019  1423   UROBILINOGEN 0.2 11/02/2017 1428   NITRITE NEGATIVE 08/21/2019 1423   LEUKOCYTESUR MODERATE (A) 08/21/2019 1423   Sepsis Labs: @LABRCNTIP (procalcitonin:4,lacticidven:4) )No results found for this or any previous visit (from the past 240 hour(s)).   Radiological Exams on Admission: CT Angio Head W or Wo Contrast  Result Date: 08/21/2019 CLINICAL DATA:  Vertigo. EXAM: CT ANGIOGRAPHY HEAD AND NECK TECHNIQUE: Multidetector CT imaging of the head and neck was performed using the standard protocol during bolus administration of intravenous contrast. Multiplanar CT image reconstructions and MIPs were obtained to evaluate the vascular anatomy. Carotid stenosis measurements (when applicable) are obtained utilizing NASCET criteria, using the distal internal carotid diameter as the denominator. CONTRAST:  62mL OMNIPAQUE IOHEXOL 350 MG/ML SOLN COMPARISON:  None. FINDINGS: CTA NECK FINDINGS Aortic arch: Partially visualized postsurgical changes of ascending aortic repair. Standard 3 vessel aortic arch with less than 50% narrowing of the brachiocephalic artery origin. Right carotid system: Patent with minimal calcified plaque at the carotid bifurcation. No evidence of stenosis or dissection. Tortuous mid cervical ICA. Left carotid system: Patent with minimal calcified plaque at the carotid bifurcation. No evidence of stenosis or dissection. Vertebral arteries: Patent without evidence of stenosis or dissection. Strongly dominant left vertebral artery. Skeleton: Cervical disc degeneration with advanced disc space narrowing and spurring at C5-6. Multilevel facet arthrosis, severe on the right at C7-T1. Other neck: No evidence of cervical lymphadenopathy  or mass. Upper chest: Mild biapical pleuroparenchymal scarring. Review of the MIP images confirms the above findings CTA HEAD FINDINGS Anterior circulation: The internal carotid arteries are patent from skull base to carotid termini with mild nonstenotic plaque  bilaterally. ACAs and MCAs are patent without evidence of proximal branch occlusion or significant proximal stenosis. No aneurysm is identified. Posterior circulation: The intracranial vertebral arteries are patent with scattered atherosclerotic plaque bilaterally resulting and mild stenosis on the right. Patent PICAs and SCAs are seen bilaterally. The basilar artery is widely patent. There is a small left posterior communicating artery. Both PCAs are patent without evidence of significant proximal stenosis. No aneurysm is identified. Venous sinuses: Patent. Anatomic variants: None. Review of the MIP images confirms the above findings IMPRESSION: Mild atherosclerosis in the head and neck without large vessel occlusion or significant stenosis. Electronically Signed   By: Logan Bores M.D.   On: 08/21/2019 17:47   DG Ribs Unilateral W/Chest Left  Result Date: 08/21/2019 CLINICAL DATA:  Rib pain since a fall out of the bathtub this morning. EXAM: LEFT RIBS AND CHEST - 3+ VIEW COMPARISON:  Chest x-ray dated 04/13/2015 FINDINGS: No fracture or other bone lesions are seen involving the ribs. There is no evidence of pneumothorax or pleural effusion. Both lungs are clear. Heart size and mediastinal contours are within normal limits. Prosthetic aortic valve. IMPRESSION: Negative. Electronically Signed   By: Lorriane Shire M.D.   On: 08/21/2019 13:41   DG Lumbar Spine 2-3 Views  Result Date: 08/21/2019 CLINICAL DATA:  Fall out of bathtub this morning. Low back pain. EXAM: LUMBAR SPINE - 2-3 VIEW COMPARISON:  12/18/2014 lumbar spine radiographs FINDINGS: This report assumes 5 non rib-bearing lumbar vertebrae, with diminutive ribs at T12. Mild rotatory dextrocurvature of the lumbar spine. Cholecystectomy clips are seen in the right upper quadrant of the abdomen. Lumbar vertebral body heights are preserved, with no fracture. Moderate multilevel lumbar degenerative disc disease, most prominent at L4-5. No  spondylolisthesis. Mild lower lumbar facet arthropathy. No aggressive appearing focal osseous lesions. Abdominal aortic atherosclerosis. IMPRESSION: No lumbar spine fracture or spondylolisthesis. Moderate multilevel lumbar degenerative disc disease. Electronically Signed   By: Ilona Sorrel M.D.   On: 08/21/2019 13:42   DG Pelvis 1-2 Views  Result Date: 08/21/2019 CLINICAL DATA:  Fall out of bathtub this morning with low back pain EXAM: PELVIS - 1-2 VIEW COMPARISON:  10/16/2015 CT abdomen/pelvis FINDINGS: No pelvic fracture or diastasis. No evidence of hip dislocation on this single frontal view. No suspicious focal osseous lesions. Surgical clip overlies the left iliac wing. Degenerative changes in the visualized lower lumbar spine. IMPRESSION: No pelvic fracture. Electronically Signed   By: Ilona Sorrel M.D.   On: 08/21/2019 13:39   CT Head Wo Contrast  Result Date: 08/21/2019 CLINICAL DATA:  Head trauma with posterior scalp hematoma secondary to a fall in the bathtub. The patient takes Coumadin. Loss of consciousness before the fall. EXAM: CT HEAD WITHOUT CONTRAST CT CERVICAL SPINE WITHOUT CONTRAST TECHNIQUE: Multidetector CT imaging of the head and cervical spine was performed following the standard protocol without intravenous contrast. Multiplanar CT image reconstructions of the cervical spine were also generated. COMPARISON:  CT scan of the head dated 04/06/2014 FINDINGS: CT HEAD FINDINGS Brain: No evidence of acute infarction, hemorrhage, hydrocephalus, extra-axial collection or mass lesion/mass effect. There are a few tiny areas of periventricular white matter lucency consistent with chronic small vessel ischemic disease, unchanged since the prior study. Slight asymmetry of the lateral ventricles,  stable. Vascular: No hyperdense vessel or unexpected calcification. Skull: Normal. Negative for fracture or focal lesion. Sinuses/Orbits: Normal. Other: None CT CERVICAL SPINE FINDINGS Alignment: Cervical  alignment is normal. 2 mm spondylolisthesis of T2 on T3 felt to be due to bilateral facet arthritis. Skull base and vertebrae: No acute fracture. No primary bone lesion or focal pathologic process. Soft tissues and spinal canal: No prevertebral fluid or swelling. No visible canal hematoma. Disc levels: C2-3 and C3-4: Normal discs. Minimal degenerative changes of the facet joints. C4-5: Tiny central disc bulge with no neural impingement. Slight degenerative changes of the left facet joint. C5-6: Marked degenerative disc disease with disc space narrowing and prominent degenerative changes of the vertebral endplates. Broad-based disc osteophyte complex without soft disc protrusion. No focal neural impingement. No significant foraminal stenosis. No facet arthritis. C6-7: Disc space narrowing. Broad-based disc osteophyte complex without neural impingement. Widely patent neural foramina. Normal facet joints. C7-T1: Normal disc. Moderately severe left facet arthritis. No foraminal stenosis. T1-2: Normal. T2-3: Moderate right and mild left facet arthritis. 2 mm spondylolisthesis of T2 on T3 without disc bulging or disc protrusion. T3-4 and T4-5: Normal discs. Moderate right facet arthritis at each level. Upper chest: Emphysematous changes in both lung apices. Other: None IMPRESSION: 1. No acute intracranial abnormality. Minimal chronic small vessel ischemic changes in the periventricular white matter. 2. No acute abnormality of the cervical spine. Multilevel degenerative disc and joint disease. Electronically Signed   By: Lorriane Shire M.D.   On: 08/21/2019 13:24   CT Angio Neck W and/or Wo Contrast  Result Date: 08/21/2019 CLINICAL DATA:  Vertigo. EXAM: CT ANGIOGRAPHY HEAD AND NECK TECHNIQUE: Multidetector CT imaging of the head and neck was performed using the standard protocol during bolus administration of intravenous contrast. Multiplanar CT image reconstructions and MIPs were obtained to evaluate the vascular  anatomy. Carotid stenosis measurements (when applicable) are obtained utilizing NASCET criteria, using the distal internal carotid diameter as the denominator. CONTRAST:  64mL OMNIPAQUE IOHEXOL 350 MG/ML SOLN COMPARISON:  None. FINDINGS: CTA NECK FINDINGS Aortic arch: Partially visualized postsurgical changes of ascending aortic repair. Standard 3 vessel aortic arch with less than 50% narrowing of the brachiocephalic artery origin. Right carotid system: Patent with minimal calcified plaque at the carotid bifurcation. No evidence of stenosis or dissection. Tortuous mid cervical ICA. Left carotid system: Patent with minimal calcified plaque at the carotid bifurcation. No evidence of stenosis or dissection. Vertebral arteries: Patent without evidence of stenosis or dissection. Strongly dominant left vertebral artery. Skeleton: Cervical disc degeneration with advanced disc space narrowing and spurring at C5-6. Multilevel facet arthrosis, severe on the right at C7-T1. Other neck: No evidence of cervical lymphadenopathy or mass. Upper chest: Mild biapical pleuroparenchymal scarring. Review of the MIP images confirms the above findings CTA HEAD FINDINGS Anterior circulation: The internal carotid arteries are patent from skull base to carotid termini with mild nonstenotic plaque bilaterally. ACAs and MCAs are patent without evidence of proximal branch occlusion or significant proximal stenosis. No aneurysm is identified. Posterior circulation: The intracranial vertebral arteries are patent with scattered atherosclerotic plaque bilaterally resulting and mild stenosis on the right. Patent PICAs and SCAs are seen bilaterally. The basilar artery is widely patent. There is a small left posterior communicating artery. Both PCAs are patent without evidence of significant proximal stenosis. No aneurysm is identified. Venous sinuses: Patent. Anatomic variants: None. Review of the MIP images confirms the above findings IMPRESSION:  Mild atherosclerosis in the head and neck without large vessel  occlusion or significant stenosis. Electronically Signed   By: Logan Bores M.D.   On: 08/21/2019 17:47   CT Cervical Spine Wo Contrast  Result Date: 08/21/2019 CLINICAL DATA:  Head trauma with posterior scalp hematoma secondary to a fall in the bathtub. The patient takes Coumadin. Loss of consciousness before the fall. EXAM: CT HEAD WITHOUT CONTRAST CT CERVICAL SPINE WITHOUT CONTRAST TECHNIQUE: Multidetector CT imaging of the head and cervical spine was performed following the standard protocol without intravenous contrast. Multiplanar CT image reconstructions of the cervical spine were also generated. COMPARISON:  CT scan of the head dated 04/06/2014 FINDINGS: CT HEAD FINDINGS Brain: No evidence of acute infarction, hemorrhage, hydrocephalus, extra-axial collection or mass lesion/mass effect. There are a few tiny areas of periventricular white matter lucency consistent with chronic small vessel ischemic disease, unchanged since the prior study. Slight asymmetry of the lateral ventricles, stable. Vascular: No hyperdense vessel or unexpected calcification. Skull: Normal. Negative for fracture or focal lesion. Sinuses/Orbits: Normal. Other: None CT CERVICAL SPINE FINDINGS Alignment: Cervical alignment is normal. 2 mm spondylolisthesis of T2 on T3 felt to be due to bilateral facet arthritis. Skull base and vertebrae: No acute fracture. No primary bone lesion or focal pathologic process. Soft tissues and spinal canal: No prevertebral fluid or swelling. No visible canal hematoma. Disc levels: C2-3 and C3-4: Normal discs. Minimal degenerative changes of the facet joints. C4-5: Tiny central disc bulge with no neural impingement. Slight degenerative changes of the left facet joint. C5-6: Marked degenerative disc disease with disc space narrowing and prominent degenerative changes of the vertebral endplates. Broad-based disc osteophyte complex without soft  disc protrusion. No focal neural impingement. No significant foraminal stenosis. No facet arthritis. C6-7: Disc space narrowing. Broad-based disc osteophyte complex without neural impingement. Widely patent neural foramina. Normal facet joints. C7-T1: Normal disc. Moderately severe left facet arthritis. No foraminal stenosis. T1-2: Normal. T2-3: Moderate right and mild left facet arthritis. 2 mm spondylolisthesis of T2 on T3 without disc bulging or disc protrusion. T3-4 and T4-5: Normal discs. Moderate right facet arthritis at each level. Upper chest: Emphysematous changes in both lung apices. Other: None IMPRESSION: 1. No acute intracranial abnormality. Minimal chronic small vessel ischemic changes in the periventricular white matter. 2. No acute abnormality of the cervical spine. Multilevel degenerative disc and joint disease. Electronically Signed   By: Lorriane Shire M.D.   On: 08/21/2019 13:24    EKG: Independently reviewed.  A. fib prolonged QTC.  Assessment/Plan Principal Problem:   Near syncope Active Problems:   Hx of mechanical aortic valve replacement   Hyperthyroidism   Atrial fibrillation (HCC)   Essential hypertension, benign   Type 2 diabetes mellitus with diabetic polyneuropathy, without long-term current use of insulin ([place])    1. Fall/near syncope with prolonged QTC appreciate cardiology consult.  Closely monitoring telemetry for any arrhythmias.  Closely monitor metabolic panel and keep potassium and magnesium with higher level.  Patient is on Seroquel and Effexor which could probably be affecting QT prolongation which may need to be addressed by patient's psychiatrist. 2. Diarrhea -abdomen appears benign.  Has not taken any recent antibiotics.  If there is any further episodes will need stool studies. 3. History of mechanical aortic valve and A. fib on Coumadin.  Has had recent difficulty to obtain INR levels.  Coumadin will be dosed per pharmacy.  On metoprolol for heart rate  control. 4. Chronic kidney disease stage II creatinine appears to be at baseline. 5. Diabetes mellitus type 2 we  will keep patient on sliding scale coverage. 6. History of hypothyroidism -patient states she had a thyroidectomy and stopped taking methimazole. 7. Hyperlipidemia on statins. 8. Bipolar disorder and anxiety on Lamictal Seroquel Effexor Klonopin and Trileptal.   DVT prophylaxis: Coumadin. Code Status: Full code. Family Communication: Discussed with patient. Disposition Plan: Home. Consults called: Cardiology. Admission status: Observation.   Rise Patience MD [facility] Pager [phone].  If 7PM-7AM, please contact night-coverage [email or web address] Password [facility]  08/22/2019, 12:16 AM

## 2019-08-22 NOTE — ED Notes (Signed)
Spoke with admitting physician about patient current status.

## 2019-08-22 NOTE — Consult Note (Signed)
[facility] Consult Note   Primary Physician:  Mosie Lukes, MD      Primary Cardiologist:   Daneen Schick, MD  Reason for Consultation:   Prolonged QTc  HPI:    [name] is a 69 year old female with a past medical history significant for AVR in 2011 with a mechanical valve (on Coumadin, INR goal 2.5-3.5), atrial fibrillation, hypertension, hyperlipidemia, diabetes mellitus, migraine headaches and bipolar affective disorder who presents to the hospital after a ground-level fall.  The patient apparently slipped in the bathroom and hit her head with subsequent headache. CT angiogram of the head and neck did not reveal any acute pathology.  She has done well from a cardiovascular standpoint. She denies any orthopnea, paroxysmal nocturnal dyspnea or leg swelling. There has been some difficulty getting her INR to be therapeutic. It has been hovering around 2.0.  The patient states that she has had issues with her stability for years. This had gotten worse recently.  In the ED the blood pressure was 139/78 mmHg with a heart rate of 62 bpm. The electrocardiogram revealed atrial fibrillation, rate of 77 bpm, and non-specific T wave changes. The QTc was >500 msec. The labs were as follows: potassium 3.7, creatinine 1.1, magnesium 1.7, WBC 8.1, hematocrit 40.4 and platelets 257.   Previous cardiac testing: Echocardiogram 12/28/2018 1. The left ventricle has normal systolic function, with an ejection  fraction of 55-60%. The cavity size was normal. Left ventricular diastolic  Doppler parameters are consistent with pseudonormalization.  2. The right ventricle has normal systolic function. The cavity was  normal. There is no increase in right ventricular wall thickness.  3. Left atrial size was mildly dilated.  4. Right atrial size was mildly dilated.  5. There is mild mitral annular calcification present. No evidence of  mitral valve stenosis.  6. The tricuspid valve is grossly  normal.  7. S/P mechanical AV replacement.  8. The aorta is normal in size and structure.  9. The aortic root and ascending aorta are normal in size and structure.  10. Grossly normal.   FINDINGS  Left Ventricle: The left ventricle has normal systolic function, with an  ejection fraction of 55-60%. The cavity size was normal. There is no  increase in left ventricular wall thickness. Left ventricular diastolic  Doppler parameters are consistent with  pseudonormalization.     Home Medications Prior to Admission medications   Medication Sig Start Date End Date Taking? Authorizing Provider  acetaminophen (TYLENOL) 500 MG tablet Take 1,000-1,500 mg by mouth daily as needed (for knee pain).    Yes [provider]  amLODipine (NORVASC) 5 MG tablet Take 1 tablet (5 mg total) by mouth daily. 03/03/19  Yes Belva Crome, MD  aspirin EC 81 MG tablet Take 81 mg by mouth daily.   Yes [provider]  BIOTIN PO Take 1 tablet by mouth daily with breakfast.   Yes [provider]  clonazePAM (KLONOPIN) 0.5 MG tablet Take 1 tablet (0.5 mg total) by mouth at bedtime. 03/27/19 08/21/19 Yes Thayer Headings, PMHNP  fenofibrate micronized (ANTARA) 130 MG capsule Take 1 capsule (130 mg total) by mouth daily before breakfast. 03/19/18  Yes Mosie Lukes, MD  furosemide (LASIX) 40 MG tablet Take 1 tablet (40 mg total) by mouth daily. 06/03/19  Yes Belva Crome, MD  lamoTRIgine (LAMICTAL) 100 MG tablet Take 100 mg by mouth in the morning and 200 mg by mouth at bedtime Patient  taking differently: Take 100-200 mg by mouth See admin instructions. Take 100 mg by mouth in the morning and 200 mg by mouth at bedtime 08/17/19  Yes Thayer Headings, PMHNP  metFORMIN (GLUCOPHAGE XR) 500 MG 24 hr tablet Take 2 tablets (1,000 mg total) by mouth 2 (two) times daily with a meal. Patient taking differently: Take 500 mg by mouth in the morning and at bedtime.  06/29/19  Yes Shamleffer, Melanie Crazier,  MD  metoprolol succinate (TOPROL-XL) 50 MG 24 hr tablet Take 1 tablet (50 mg total) by mouth daily. Take with or immediately following a meal. 12/14/18 08/21/19 Yes Belva Crome, MD  Multiple Vitamins-Minerals (CENTRUM SILVER 50+WOMEN PO) Take 1 tablet by mouth daily with breakfast.   Yes [provider]  OXcarbazepine (TRILEPTAL) 150 MG tablet Take 1 tablet (150 mg total) by mouth 2 (two) times daily. 06/21/19 09/19/19 Yes Thayer Headings, PMHNP  pantoprazole (PROTONIX) 40 MG tablet Take 1 tablet (40 mg total) by mouth daily. 08/27/18  Yes Mosie Lukes, MD  QUEtiapine (SEROQUEL XR) 300 MG 24 hr tablet Take 2 tablets (600 mg total) by mouth every evening. 06/21/19 09/19/19 Yes Thayer Headings, PMHNP  QUEtiapine (SEROQUEL) 100 MG tablet Take 1/2-1 tab po QHS Patient taking differently: Take 100 mg by mouth every evening.  03/04/19  Yes Thayer Headings, PMHNP  rOPINIRole (REQUIP) 0.25 MG tablet Take 1-2 tablets (0.25-0.5 mg total) by mouth at bedtime. Patient taking differently: Take 0.5 mg by mouth at bedtime.  06/21/19  Yes Mosie Lukes, MD  SUMAtriptan (IMITREX) 50 MG tablet TAKE ONE TABLET EVERY 2 HOURS AS NEEDED FOR MIGRAINE. MAY REPEAT IN 2 HOURS IF HEADACHE PERSISTS OR RECURS. (MAX OF 2 TABLETS IN 24 HOURS) Patient taking differently: Take 50 mg by mouth every 2 (two) hours as needed for migraine or headache (and may repeat once in 2 hours, if no relief (MAX 2 tablets/24 hours)).  12/01/17  Yes Mosie Lukes, MD  traMADol (ULTRAM) 50 MG tablet Take 50 mg by mouth every 6 (six) hours as needed (for pain).    Yes [provider]  venlafaxine XR (EFFEXOR XR) 75 MG 24 hr capsule Take 1 capsule (75 mg total) by mouth daily with breakfast. 08/17/19 11/15/19 Yes Thayer Headings, PMHNP  warfarin (COUMADIN) 5 MG tablet Take 5mg  daily except 2.5mg  on Monday and Wednesday or take as directed by [facility] Clinic. Patient taking differently: Take 5-7.5 mg by mouth See admin instructions. Take 5  mg by mouth in the evening on Sun/Mon/Tues/Wed/Sat and 7.5 mg on Thurs/Fri 05/05/19  Yes Belva Crome, MD  enoxaparin (LOVENOX) 120 MG/0.8ML injection Inject 0.8 mLs (120 mg total) into the skin every 12 (twelve) hours. As directed by coumadin clinic Patient not taking: Reported on 08/21/2019 12/28/18   Constance Haw, MD  glucose blood [facility] VERIO) test strip Check blood sugars once daily Dx: E11.9 10/12/18   Mosie Lukes, MD  Lancets [facility] ULTRASOFT) lancets Use as instructed to test blood sugars once daily Dx. E11.65 08/18/18   Shamleffer, Melanie Crazier, MD  ONE TOUCH LANCETS MISC Test once daily to check blood sugar. DX E11.9 04/26/15   Mosie Lukes, MD  rosuvastatin (CRESTOR) 40 MG tablet Take 1 tablet (40 mg total) by mouth daily. Patient not taking: Reported on 08/21/2019 06/21/19   Mosie Lukes, MD  methimazole (TAPAZOLE) 5 MG tablet Take 1 tablet (5 mg total) by mouth 3 (three) times daily. 06/24/11 08/05/11  Renato Shin, MD  Past Medical History: Past Medical History:  Diagnosis Date  . ANEMIA 05/28/2010  . AORTIC VALVE REPLACEMENT, HX OF 03/11/2010   Mechanical prosthesis  . BIPOLAR AFFECTIVE DISORDER 03/11/2010  . Depression   . Diverticulitis 12/18/2010  . FIBROIDS, UTERUS 03/11/2010  . Gout 09/04/2016  . Grave's disease 8-12  . Hyperlipidemia associated with type 2 diabetes mellitus ([place]) 06/05/2016  . Hypertension   . Low back pain 11/02/2017  . Migraine 06/05/2016  . Mixed hyperlipidemia 03/11/2010  . Obesity   . Skin cancer   . UTI (lower urinary tract infection) 08/14/2011    Past Surgical History: Past Surgical History:  Procedure Laterality Date  . ABDOMINAL HYSTERECTOMY  ?1998   sb/l spo, total for fibroids/heavy bleeding  . ANKLE SURGERY Left 01/2018   hardware removal  . AORTIC VALVE REPLACEMENT  2011   Mechanical prosthesis, [employer]  . APPENDECTOMY     Required revision for EColi infection, required recurrent packing  . bowel  obstruction     Requiring adhesions to be removed  . CHOLECYSTECTOMY    . COLONOSCOPY WITH PROPOFOL N/A 11/01/2015   Procedure: COLONOSCOPY WITH PROPOFOL;  Surgeon: Milus Banister, MD;  Location: [facility];  Service: Endoscopy;  Laterality: N/A;  . CYSTOCELE REPAIR N/A 10/16/2015   Procedure: ANTERIOR REPAIR (CYSTOCELE);  Surgeon: Donnamae Jude, MD;  Location: [facility];  Service: Gynecology;  Laterality: N/A;  . HERNIA REPAIR  08-16-10  . LEFT HEART CATHETERIZATION WITH CORONARY ANGIOGRAM N/A 12/05/2011   Procedure: LEFT HEART CATHETERIZATION WITH CORONARY ANGIOGRAM;  Surgeon: Sinclair Grooms, MD;  Location: [facility] LAB;  Service: Cardiovascular;  Laterality: N/A;  . TOTAL KNEE ARTHROPLASTY Right 02/24/2013   Procedure: RIGHT TOTAL KNEE ARTHROPLASTY;  Surgeon: Johnn Hai, MD;  Location: [facility];  Service: Orthopedics;  Laterality: Right;  . TUBAL LIGATION    . varicose vein surgery b/l legs      Family History: Family History  Problem Relation Age of Onset  . Scoliosis Mother   . Arthritis Mother        Rheumatoid  . Aneurysm Mother        heart  . Alcohol abuse Father   . Depression Sister   . Depression Brother   . Alcohol abuse Brother   . Cancer Maternal Grandmother        colon  . Diabetes Maternal Grandfather   . Heart disease Maternal Grandfather   . Alcohol abuse Maternal Grandfather   . Depression Paternal Grandmother   . Diabetes Paternal Grandmother   . Depression Paternal Grandfather   . Diabetes Paternal Grandfather   . Depression Sister   . Alcohol abuse Brother   . Depression Brother   . Heart disease Brother     Social History: Social History   Socioeconomic History  . Marital status: Married    Spouse name: Not on file  . Number of children: Not on file  . Years of education: Not on file  . Highest education level: Not on file  Occupational History  . Not on file  Tobacco Use  . Smoking status: Former Smoker    Quit date: 05/26/1990    Years  since quitting: 29.2  . Smokeless tobacco: Never Used  Substance and Sexual Activity  . Alcohol use: Yes    Alcohol/week: 0.0 standard drinks    Comment: rare  . Drug use: No  . Sexual activity: Not Currently  Other Topics Concern  . Not on file  Social History Narrative  . Not on file   Social Determinants of Health   Financial Resource Strain:   . Difficulty of Paying Living Expenses:   Food Insecurity:   . Worried About Charity fundraiser in the Last Year:   . Arboriculturist in the Last Year:   Transportation Needs:   . Film/video editor (Medical):   Marland Kitchen Lack of Transportation (Non-Medical):   Physical Activity:   . Days of Exercise per Week:   . Minutes of Exercise per Session:   Stress:   . Feeling of Stress :   Social Connections:   . Frequency of Communication with Friends and Family:   . Frequency of Social Gatherings with Friends and Family:   . Attends Religious Services:   . Active Member of Clubs or Organizations:   . Attends Archivist Meetings:   Marland Kitchen Marital Status:     Allergies:  Allergies  Allergen Reactions  . Mucinex [Guaifenesin Er] Other (See Comments)    Severe headaches   . Keflex [Cephalexin] Other (See Comments)    Headaches and dizziness  . Sulfonamide Derivatives Other (See Comments)    Headaches      Review of Systems: [y] = yes, [ ]  = no   . General: Weight gain [ ] ; Weight loss [ ] ; Anorexia [ ] ; Fatigue [ ] ; Fever [ ] ; Chills [ ] ; Weakness [Y]  . Cardiac: Chest pain/pressure [ ] ; Resting SOB [ ] ; Exertional SOB [ ] ; Orthopnea [ ] ; Pedal Edema [ ] ; Palpitations [ ] ; Syncope [ ] ; Presyncope [ ] ; Paroxysmal nocturnal dyspnea[ ]   . Pulmonary: Cough [ ] ; Wheezing[ ] ; Hemoptysis[ ] ; Sputum [ ] ; Snoring [ ]   . GI: Vomiting[ ] ; Dysphagia[ ] ; Melena[ ] ; Hematochezia [ ] ; Heartburn[ ] ; Abdominal pain [ ] ; Constipation [ ] ; Diarrhea [ ] ; BRBPR [ ]   . GU: Hematuria[ ] ; Dysuria [ ] ; Nocturia[ ]   . Vascular: Pain in legs with  walking [ ] ; Pain in feet with lying flat [ ] ; Non-healing sores [ ] ; Stroke [ ] ; TIA [ ] ; Slurred speech [ ] ;  . Neuro: Headaches[ ] ; Vertigo[ ] ; Seizures[ ] ; Paresthesias[ ] ;Blurred vision [ ] ; Diplopia [ ] ; Vision changes [ ]   . Ortho/Skin: Arthritis [ ] ; Joint pain [ ] ; Muscle pain [ ] ; Joint swelling [ ] ; Back Pain [ ] ; Rash [ ]   . Psych: Depression[ ] ; Anxiety[ ]   . Heme: Bleeding problems [ ] ; Clotting disorders [ ] ; Anemia [ ]   . Endocrine: Diabetes [ ] ; Thyroid dysfunction[ ]      Objective:    Vital Signs:   Temp:  [99 F (37.2 C)] 99 F (37.2 C) (03/28 1931) Pulse Rate:  [39-78] 62 (03/28 2300) Resp:  [16-24] 16 (03/28 2300) BP: (111-156)/(65-104) 139/78 (03/28 2300) SpO2:  [91 %-96 %] 95 % (03/28 2300) Weight:  [102.1 kg] 102.1 kg (03/28 1230)    Weight change: Filed Weights   08/21/19 1230  Weight: 102.1 kg    Intake/Output:  No intake or output data in the 24 hours ending 08/22/19 0010    Physical Exam    General:  Well appearing. No resp difficulty HEENT: normal Neck: supple. JVP . Carotids 2+ bilat; no bruits. No lymphadenopathy or thyromegaly appreciated. Cor: normal prosthetic clicks Lungs: clear to auscultation bilaterally Abdomen: soft, nontender, nondistended. No hepatosplenomegaly. No bruits or masses. Good bowel sounds. Extremities: no cyanosis, clubbing, rash, edema Neuro: alert & orientedx3, cranial nerves grossly intact.  moves all 4 extremities w/o difficulty. Affect pleasant    Labs   Basic Metabolic Panel: Recent Labs  Lab 08/21/19 1236 08/21/19 1250  NA 141  --   K 3.7  --   CL 98  --   CO2 27  --   GLUCOSE 133*  --   BUN 10  --   CREATININE 1.10* 1.00  CALCIUM 9.4  --   MG 1.7  --     Liver Function Tests: Recent Labs  Lab 08/21/19 1236  AST 25  ALT 24  ALKPHOS 55  BILITOT 1.0  PROT 7.5  ALBUMIN 4.2   Recent Labs  Lab 08/21/19 1236  LIPASE 33   No results for input(s): AMMONIA in the last 168  hours.  CBC: Recent Labs  Lab 08/21/19 1236  WBC 8.1  NEUTROABS 6.1  HGB 13.0  HCT 40.4  MCV 91.2  PLT 257    Cardiac Enzymes: No results for input(s): CKTOTAL, CKMB, CKMBINDEX, TROPONINI in the last 168 hours.  BNP: BNP (last 3 results) No results for input(s): BNP in the last 8760 hours.  ProBNP (last 3 results) No results for input(s): PROBNP in the last 8760 hours.   CBG: No results for input(s): GLUCAP in the last 168 hours.  Coagulation Studies: Recent Labs    08/19/19 1428 08/21/19 1236  LABPROT  --  22.3*  INR 1.9* 2.0*     Imaging   CT Angio Head W or Wo Contrast  Result Date: 08/21/2019 CLINICAL DATA:  Vertigo. EXAM: CT ANGIOGRAPHY HEAD AND NECK TECHNIQUE: Multidetector CT imaging of the head and neck was performed using the standard protocol during bolus administration of intravenous contrast. Multiplanar CT image reconstructions and MIPs were obtained to evaluate the vascular anatomy. Carotid stenosis measurements (when applicable) are obtained utilizing NASCET criteria, using the distal internal carotid diameter as the denominator. CONTRAST:  91mL OMNIPAQUE IOHEXOL 350 MG/ML SOLN COMPARISON:  None. FINDINGS: CTA NECK FINDINGS Aortic arch: Partially visualized postsurgical changes of ascending aortic repair. Standard 3 vessel aortic arch with less than 50% narrowing of the brachiocephalic artery origin. Right carotid system: Patent with minimal calcified plaque at the carotid bifurcation. No evidence of stenosis or dissection. Tortuous mid cervical ICA. Left carotid system: Patent with minimal calcified plaque at the carotid bifurcation. No evidence of stenosis or dissection. Vertebral arteries: Patent without evidence of stenosis or dissection. Strongly dominant left vertebral artery. Skeleton: Cervical disc degeneration with advanced disc space narrowing and spurring at C5-6. Multilevel facet arthrosis, severe on the right at C7-T1. Other neck: No evidence of  cervical lymphadenopathy or mass. Upper chest: Mild biapical pleuroparenchymal scarring. Review of the MIP images confirms the above findings CTA HEAD FINDINGS Anterior circulation: The internal carotid arteries are patent from skull base to carotid termini with mild nonstenotic plaque bilaterally. ACAs and MCAs are patent without evidence of proximal branch occlusion or significant proximal stenosis. No aneurysm is identified. Posterior circulation: The intracranial vertebral arteries are patent with scattered atherosclerotic plaque bilaterally resulting and mild stenosis on the right. Patent PICAs and SCAs are seen bilaterally. The basilar artery is widely patent. There is a small left posterior communicating artery. Both PCAs are patent without evidence of significant proximal stenosis. No aneurysm is identified. Venous sinuses: Patent. Anatomic variants: None. Review of the MIP images confirms the above findings IMPRESSION: Mild atherosclerosis in the head and neck without large vessel occlusion or significant stenosis. Electronically Signed   By: Seymour Bars.D.  On: 08/21/2019 17:47   DG Ribs Unilateral W/Chest Left  Result Date: 08/21/2019 CLINICAL DATA:  Rib pain since a fall out of the bathtub this morning. EXAM: LEFT RIBS AND CHEST - 3+ VIEW COMPARISON:  Chest x-ray dated 04/13/2015 FINDINGS: No fracture or other bone lesions are seen involving the ribs. There is no evidence of pneumothorax or pleural effusion. Both lungs are clear. Heart size and mediastinal contours are within normal limits. Prosthetic aortic valve. IMPRESSION: Negative. Electronically Signed   By: Lorriane Shire M.D.   On: 08/21/2019 13:41   DG Lumbar Spine 2-3 Views  Result Date: 08/21/2019 CLINICAL DATA:  Fall out of bathtub this morning. Low back pain. EXAM: LUMBAR SPINE - 2-3 VIEW COMPARISON:  12/18/2014 lumbar spine radiographs FINDINGS: This report assumes 5 non rib-bearing lumbar vertebrae, with diminutive ribs at T12.  Mild rotatory dextrocurvature of the lumbar spine. Cholecystectomy clips are seen in the right upper quadrant of the abdomen. Lumbar vertebral body heights are preserved, with no fracture. Moderate multilevel lumbar degenerative disc disease, most prominent at L4-5. No spondylolisthesis. Mild lower lumbar facet arthropathy. No aggressive appearing focal osseous lesions. Abdominal aortic atherosclerosis. IMPRESSION: No lumbar spine fracture or spondylolisthesis. Moderate multilevel lumbar degenerative disc disease. Electronically Signed   By: Ilona Sorrel M.D.   On: 08/21/2019 13:42   DG Pelvis 1-2 Views  Result Date: 08/21/2019 CLINICAL DATA:  Fall out of bathtub this morning with low back pain EXAM: PELVIS - 1-2 VIEW COMPARISON:  10/16/2015 CT abdomen/pelvis FINDINGS: No pelvic fracture or diastasis. No evidence of hip dislocation on this single frontal view. No suspicious focal osseous lesions. Surgical clip overlies the left iliac wing. Degenerative changes in the visualized lower lumbar spine. IMPRESSION: No pelvic fracture. Electronically Signed   By: Ilona Sorrel M.D.   On: 08/21/2019 13:39   CT Head Wo Contrast  Result Date: 08/21/2019 CLINICAL DATA:  Head trauma with posterior scalp hematoma secondary to a fall in the bathtub. The patient takes Coumadin. Loss of consciousness before the fall. EXAM: CT HEAD WITHOUT CONTRAST CT CERVICAL SPINE WITHOUT CONTRAST TECHNIQUE: Multidetector CT imaging of the head and cervical spine was performed following the standard protocol without intravenous contrast. Multiplanar CT image reconstructions of the cervical spine were also generated. COMPARISON:  CT scan of the head dated 04/06/2014 FINDINGS: CT HEAD FINDINGS Brain: No evidence of acute infarction, hemorrhage, hydrocephalus, extra-axial collection or mass lesion/mass effect. There are a few tiny areas of periventricular white matter lucency consistent with chronic small vessel ischemic disease, unchanged  since the prior study. Slight asymmetry of the lateral ventricles, stable. Vascular: No hyperdense vessel or unexpected calcification. Skull: Normal. Negative for fracture or focal lesion. Sinuses/Orbits: Normal. Other: None CT CERVICAL SPINE FINDINGS Alignment: Cervical alignment is normal. 2 mm spondylolisthesis of T2 on T3 felt to be due to bilateral facet arthritis. Skull base and vertebrae: No acute fracture. No primary bone lesion or focal pathologic process. Soft tissues and spinal canal: No prevertebral fluid or swelling. No visible canal hematoma. Disc levels: C2-3 and C3-4: Normal discs. Minimal degenerative changes of the facet joints. C4-5: Tiny central disc bulge with no neural impingement. Slight degenerative changes of the left facet joint. C5-6: Marked degenerative disc disease with disc space narrowing and prominent degenerative changes of the vertebral endplates. Broad-based disc osteophyte complex without soft disc protrusion. No focal neural impingement. No significant foraminal stenosis. No facet arthritis. C6-7: Disc space narrowing. Broad-based disc osteophyte complex without neural impingement. Widely patent neural  foramina. Normal facet joints. C7-T1: Normal disc. Moderately severe left facet arthritis. No foraminal stenosis. T1-2: Normal. T2-3: Moderate right and mild left facet arthritis. 2 mm spondylolisthesis of T2 on T3 without disc bulging or disc protrusion. T3-4 and T4-5: Normal discs. Moderate right facet arthritis at each level. Upper chest: Emphysematous changes in both lung apices. Other: None IMPRESSION: 1. No acute intracranial abnormality. Minimal chronic small vessel ischemic changes in the periventricular white matter. 2. No acute abnormality of the cervical spine. Multilevel degenerative disc and joint disease. Electronically Signed   By: Lorriane Shire M.D.   On: 08/21/2019 13:24   CT Angio Neck W and/or Wo Contrast  Result Date: 08/21/2019 CLINICAL DATA:  Vertigo.  EXAM: CT ANGIOGRAPHY HEAD AND NECK TECHNIQUE: Multidetector CT imaging of the head and neck was performed using the standard protocol during bolus administration of intravenous contrast. Multiplanar CT image reconstructions and MIPs were obtained to evaluate the vascular anatomy. Carotid stenosis measurements (when applicable) are obtained utilizing NASCET criteria, using the distal internal carotid diameter as the denominator. CONTRAST:  30mL OMNIPAQUE IOHEXOL 350 MG/ML SOLN COMPARISON:  None. FINDINGS: CTA NECK FINDINGS Aortic arch: Partially visualized postsurgical changes of ascending aortic repair. Standard 3 vessel aortic arch with less than 50% narrowing of the brachiocephalic artery origin. Right carotid system: Patent with minimal calcified plaque at the carotid bifurcation. No evidence of stenosis or dissection. Tortuous mid cervical ICA. Left carotid system: Patent with minimal calcified plaque at the carotid bifurcation. No evidence of stenosis or dissection. Vertebral arteries: Patent without evidence of stenosis or dissection. Strongly dominant left vertebral artery. Skeleton: Cervical disc degeneration with advanced disc space narrowing and spurring at C5-6. Multilevel facet arthrosis, severe on the right at C7-T1. Other neck: No evidence of cervical lymphadenopathy or mass. Upper chest: Mild biapical pleuroparenchymal scarring. Review of the MIP images confirms the above findings CTA HEAD FINDINGS Anterior circulation: The internal carotid arteries are patent from skull base to carotid termini with mild nonstenotic plaque bilaterally. ACAs and MCAs are patent without evidence of proximal branch occlusion or significant proximal stenosis. No aneurysm is identified. Posterior circulation: The intracranial vertebral arteries are patent with scattered atherosclerotic plaque bilaterally resulting and mild stenosis on the right. Patent PICAs and SCAs are seen bilaterally. The basilar artery is widely  patent. There is a small left posterior communicating artery. Both PCAs are patent without evidence of significant proximal stenosis. No aneurysm is identified. Venous sinuses: Patent. Anatomic variants: None. Review of the MIP images confirms the above findings IMPRESSION: Mild atherosclerosis in the head and neck without large vessel occlusion or significant stenosis. Electronically Signed   By: Logan Bores M.D.   On: 08/21/2019 17:47   CT Cervical Spine Wo Contrast  Result Date: 08/21/2019 CLINICAL DATA:  Head trauma with posterior scalp hematoma secondary to a fall in the bathtub. The patient takes Coumadin. Loss of consciousness before the fall. EXAM: CT HEAD WITHOUT CONTRAST CT CERVICAL SPINE WITHOUT CONTRAST TECHNIQUE: Multidetector CT imaging of the head and cervical spine was performed following the standard protocol without intravenous contrast. Multiplanar CT image reconstructions of the cervical spine were also generated. COMPARISON:  CT scan of the head dated 04/06/2014 FINDINGS: CT HEAD FINDINGS Brain: No evidence of acute infarction, hemorrhage, hydrocephalus, extra-axial collection or mass lesion/mass effect. There are a few tiny areas of periventricular white matter lucency consistent with chronic small vessel ischemic disease, unchanged since the prior study. Slight asymmetry of the lateral ventricles, stable. Vascular: No hyperdense  vessel or unexpected calcification. Skull: Normal. Negative for fracture or focal lesion. Sinuses/Orbits: Normal. Other: None CT CERVICAL SPINE FINDINGS Alignment: Cervical alignment is normal. 2 mm spondylolisthesis of T2 on T3 felt to be due to bilateral facet arthritis. Skull base and vertebrae: No acute fracture. No primary bone lesion or focal pathologic process. Soft tissues and spinal canal: No prevertebral fluid or swelling. No visible canal hematoma. Disc levels: C2-3 and C3-4: Normal discs. Minimal degenerative changes of the facet joints. C4-5: Tiny  central disc bulge with no neural impingement. Slight degenerative changes of the left facet joint. C5-6: Marked degenerative disc disease with disc space narrowing and prominent degenerative changes of the vertebral endplates. Broad-based disc osteophyte complex without soft disc protrusion. No focal neural impingement. No significant foraminal stenosis. No facet arthritis. C6-7: Disc space narrowing. Broad-based disc osteophyte complex without neural impingement. Widely patent neural foramina. Normal facet joints. C7-T1: Normal disc. Moderately severe left facet arthritis. No foraminal stenosis. T1-2: Normal. T2-3: Moderate right and mild left facet arthritis. 2 mm spondylolisthesis of T2 on T3 without disc bulging or disc protrusion. T3-4 and T4-5: Normal discs. Moderate right facet arthritis at each level. Upper chest: Emphysematous changes in both lung apices. Other: None IMPRESSION: 1. No acute intracranial abnormality. Minimal chronic small vessel ischemic changes in the periventricular white matter. 2. No acute abnormality of the cervical spine. Multilevel degenerative disc and joint disease. Electronically Signed   By: Lorriane Shire M.D.   On: 08/21/2019 13:24         Assessment/Plan   1. Frequent falls / prolonged QTc The patient presents to the hospital with a ground level fall. The imaging of the head and neck did not reveal any acute pathology. The electrocardiogram does show a prolonged QT interval.  -Admit to telemetry for observation -Review patient's medical regimen for any QT prolonging agents - please check possibility of Effexor and Seroquel causing QT segment prolongation -Maintain serum potassium >4.0 and serum magnesium >2.0 -The patient might require an outpatient 14 day cardiac event monitor -Reasonable to repeat a transthoracic echocardiogram.  Will continue to follow the patient. Thank you for involving [place] in Mrs Urquilla's care.   Meade Maw, MD  08/22/2019,  12:10 AM  Cardiology Overnight Team Please contact [facility] for night-coverage after hours (4p -7a ) and weekends on [email or web address]

## 2019-08-22 NOTE — Progress Notes (Signed)
ANTICOAGULATION CONSULT NOTE - Initial Consult  Pharmacy Consult for Warfarin  Indication: Atrial fibrillation and mechanical AVR  Allergies  Allergen Reactions  . Mucinex [Guaifenesin Er] Other (See Comments)    Severe headaches   . Keflex [Cephalexin] Other (See Comments)    Headaches and dizziness  . Sulfonamide Derivatives Other (See Comments)    Headaches     Patient Measurements: Height: 5\' 9"  (175.3 cm) Weight: 225 lb (102.1 kg) IBW/kg (Calculated) : 66.2  Vital Signs: Temp: 99 F (37.2 C) (03/28 1931) Temp Source: Oral (03/28 1931) BP: 139/78 (03/28 2300) Pulse Rate: 62 (03/28 2300)  Labs: Recent Labs    08/19/19 1428 08/21/19 1236 08/21/19 1250  HGB  --  13.0  --   HCT  --  40.4  --   PLT  --  257  --   LABPROT  --  22.3*  --   INR 1.9* 2.0*  --   CREATININE  --  1.10* 1.00    Estimated Creatinine Clearance: 68.5 mL/min (by C-G formula based on SCr of 1 mg/dL).   Medical History: Past Medical History:  Diagnosis Date  . ANEMIA 05/28/2010  . AORTIC VALVE REPLACEMENT, HX OF 03/11/2010   Mechanical prosthesis  . BIPOLAR AFFECTIVE DISORDER 03/11/2010  . Depression   . Diverticulitis 12/18/2010  . FIBROIDS, UTERUS 03/11/2010  . Gout 09/04/2016  . Grave's disease 8-12  . Hyperlipidemia associated with type 2 diabetes mellitus ([place]) 06/05/2016  . Hypertension   . Low back pain 11/02/2017  . Migraine 06/05/2016  . Mixed hyperlipidemia 03/11/2010  . Obesity   . Skin cancer   . UTI (lower urinary tract infection) 08/14/2011    Assessment: 69 y/o F on warfarin PTA for afib and mechanical AVR presents to the ED s/p fall in the bath tub. CT head/cervical spine is negative. INR is sub-therapeutic at 2 (goal 2.5-3.5).   Warfarin PTA dosing per outpatient anti-coag notes: Warfarin 7.5 mg on Monday and Friday, 5 mg all other days  Goal of Therapy:  INR 2.5-3.5, per outpatient anti-coag notes Monitor platelets by anticoagulation protocol: Yes   Plan:   Warfarin 7.5 mg PO x 1 now Daily PT/INR, adjust dose as needed Monitor for bleeding Consider heparin bridge if INR falls any further  Narda Bonds, PharmD, BCPS Clinical Pharmacist Phone: [phone]

## 2019-08-22 NOTE — Telephone Encounter (Signed)
Spoke with daughter and she wanted to let Dr. Tamala Julian know that pt is in the hospital with what they believe to be syncopal episodes.  Admission dx is QT prolongation.  Pt has had 5 falls over the last 5 days, with 2 involving her hitting her head while on Coumadin.  Daughter wanted to know if Dr. Tamala Julian felt like she needed any further testing while in the hospital or needed to schedule a post hosp appt.  Looks like echo is scheduled to be done today.  Advised daughter the only other test we may do is a monitor and that would be ordered from here.  Scheduled pt to come in to see Dr. Tamala Julian 4/7.  Advised I will send to Dr. Tamala Julian for review and would call back with any recommendations, otherwise we would see pt next week.

## 2019-08-22 NOTE — ED Notes (Signed)
Lunch Tray Ordered @ 1010. 

## 2019-08-22 NOTE — Progress Notes (Signed)
ANTICOAGULATION CONSULT NOTE  Pharmacy Consult for Warfarin  Indication: Atrial fibrillation and mechanical AVR  Allergies  Allergen Reactions  . Mucinex [Guaifenesin Er] Other (See Comments)    Severe headaches   . Keflex [Cephalexin] Other (See Comments)    Headaches and dizziness  . Sulfonamide Derivatives Other (See Comments)    Headaches     Patient Measurements: Height: 5\' 9"  (175.3 cm) Weight: 225 lb (102.1 kg) IBW/kg (Calculated) : 66.2  Vital Signs: BP: 125/67 (03/29 0600) Pulse Rate: 72 (03/29 0600)  Labs: Recent Labs    08/19/19 1428 08/21/19 1236 08/21/19 1250 08/22/19 0435  HGB  --  13.0  --  12.2  HCT  --  40.4  --  39.2  PLT  --  257  --  246  LABPROT  --  22.3*  --  23.2*  INR 1.9* 2.0*  --  2.1*  CREATININE  --  1.10* 1.00 1.15*    Estimated Creatinine Clearance: 59.6 mL/min (A) (by C-G formula based on SCr of 1.15 mg/dL (H)).  Assessment: 69 y/o F on warfarin PTA for afib and mechanical AVR presents to the ED s/p fall in the bath tub. CT head/cervical spine is negative. INR remains sub-therapeutic at 2.1 (goal 2.5-3.5).   Warfarin PTA dosing per outpatient anti-coag notes: Warfarin 7.5 mg on Monday and Friday, 5 mg all other days  Goal of Therapy:  INR 2.5-3.5, per outpatient anti-coag notes Monitor platelets by anticoagulation protocol: Yes   Plan:  Warfarin 7.5mg  PO x 1 tonight Daily INR May need to consider bridging  Salome Arnt, PharmD, BCPS Clinical Pharmacist Please see AMION for all pharmacy numbers 08/22/2019 8:36 AM

## 2019-08-22 NOTE — Progress Notes (Signed)
Progress Note    [name]  [id number] DOB: 1951-04-16  DOA: 08/21/2019 PCP: Mosie Lukes, MD    Brief Narrative:   Chief complaint: fall/near syncope  Medical records reviewed and are as summarized below:  [name] is an 69 y.o. female past medical history that includes mechanical aortic valve, A. fib, bipolar disorder, diabetes, chronic kidney disease stage II, presented to the emergency department after experiencing 2 falls in 1 week.  She reports these falls happen as she is trying to get in and out of the bathtub.  She denies any dizziness or lightheadedness.  She reports this time she fell and hit her head as she fell backwards.  Home medications do include Coumadin.  He denies losing consciousness.  Work-up in the emergency department reveals a prolonged QT on EKG in the setting of questionable near syncope.  She was evaluated by cardiology who opined she may require outpatient 14-day cardiac event monitor and repeat transthoracic echo cardiogram.  Assessment/Plan:   Principal Problem:   Near syncope Active Problems:   Prolonged QT interval   Atrial fibrillation (HCC)   Essential hypertension, benign   Type 2 diabetes mellitus with diabetic polyneuropathy, without long-term current use of insulin (HCC)   Hx of mechanical aortic valve replacement   Hyperthyroidism   #1.  Near syncope/fall.  Etiology unclear but patient does have a prolonged QTC on EKG and home medications include Seroquel and Effexor which are known offenders.  CT of her head without acute abnormalities.  No signs of infection.  No metabolic derangement.  No chest pain. -Echo -Potassium and magnesium to keep at a higher level -Behavioral health consult for assistance in medications review and alternatives to Seroquel and Effexor -Orthostatic vital signs -Monitor on telemetry -Await cardiology's recommendation  #2.  Prolonged QT.  See #1. -We will continue meds for now -Await behavioral  health recommendations  #3.  History of mechanical valve and A. fib.  Home medications include Coumadin and metoprolol.  INR 2.1. -Heparin drip initiated -Coumadin per pharmacy\  #4.  Chronic kidney disease stage II.  Creatinine 1.15 this morning.  Appears close to baseline -Hold nephrotoxins as able -Monitor  #5.  Bipolar disorder and anxiety.  Home medications include Seroquel, Effexor, Klonopin, Lamictal, Trileptal  -Continue home meds for now -Consider stopping Seroquel and Effexor due to #2 -Have requested a behavioral health consult for assistance    Family Communication/Anticipated D/C date and plan/Code Status   DVT prophylaxis: Lovenox ordered. Code Status: Full Code.  Family Communication: patient Disposition Plan: home 24-48 hours   Medical Consultants:    Cardiology  Behavioral healht   Anti-Infectives:    None  Subjective:   Awake alert denies pain or discomfort  Objective:    Vitals:   08/22/19 0945 08/22/19 1000 08/22/19 1015 08/22/19 1030  BP: 140/78 135/71    Pulse:  73 78 74  Resp:  19 20 (!) 22  Temp:      TempSrc:      SpO2:  91% 96% 96%  Weight:      Height:        Intake/Output Summary (Last 24 hours) at 08/22/2019 1302 Last data filed at 08/22/2019 1302 Gross per 24 hour  Intake 50 ml  Output --  Net 50 ml   Filed Weights   08/21/19 1230  Weight: 102.1 kg    Exam: General: Well-nourished awake alert no acute distress CV regular rate and rhythm +click no murmur  gallop or rub no lower extremity edema Respiratory: No increased work of breathing with conversation breath sounds are clear bilaterally I hear no wheeze no rhonchi Abdomen: Obese soft positive bowel sounds throughout nontender to palpation Musculoskeletal: Joints without swelling/erythema full range of motion Neuro: Awake alert oriented x3 speech clear facial symmetry calm cooperative  Data Reviewed:   I have personally reviewed following labs and imaging  studies:  Labs: Labs show the following:   Basic Metabolic Panel: Recent Labs  Lab 08/21/19 1236 08/21/19 1250 08/22/19 0435  NA 141  --  141  K 3.7  --  3.4*  CL 98  --  98  CO2 27  --  29  GLUCOSE 133*  --  111*  BUN 10  --  10  CREATININE 1.10* 1.00 1.15*  CALCIUM 9.4  --  8.8*  MG 1.7  --   --    GFR Estimated Creatinine Clearance: 59.6 mL/min (A) (by C-G formula based on SCr of 1.15 mg/dL (H)). Liver Function Tests: Recent Labs  Lab 08/21/19 1236  AST 25  ALT 24  ALKPHOS 55  BILITOT 1.0  PROT 7.5  ALBUMIN 4.2   Recent Labs  Lab 08/21/19 1236  LIPASE 33   No results for input(s): AMMONIA in the last 168 hours. Coagulation profile Recent Labs  Lab 08/19/19 1428 08/21/19 1236 08/22/19 0435  INR 1.9* 2.0* 2.1*    CBC: Recent Labs  Lab 08/21/19 1236 08/22/19 0435  WBC 8.1 10.8*  NEUTROABS 6.1  --   HGB 13.0 12.2  HCT 40.4 39.2  MCV 91.2 93.3  PLT 257 246   Cardiac Enzymes: No results for input(s): CKTOTAL, CKMB, CKMBINDEX, TROPONINI in the last 168 hours. BNP (last 3 results) No results for input(s): PROBNP in the last 8760 hours. CBG: Recent Labs  Lab 08/22/19 0748 08/22/19 1152  GLUCAP 138* 95   D-Dimer: No results for input(s): DDIMER in the last 72 hours. Hgb A1c: No results for input(s): HGBA1C in the last 72 hours. Lipid Profile: No results for input(s): CHOL, HDL, LDLCALC, TRIG, CHOLHDL, LDLDIRECT in the last 72 hours. Thyroid function studies: No results for input(s): TSH, T4TOTAL, T3FREE, THYROIDAB in the last 72 hours.  Invalid input(s): FREET3 Anemia work up: No results for input(s): VITAMINB12, FOLATE, FERRITIN, TIBC, IRON, RETICCTPCT in the last 72 hours. Sepsis Labs: Recent Labs  Lab 08/21/19 1236 08/22/19 0435  WBC 8.1 10.8*    Microbiology Recent Results (from the past 240 hour(s))  SARS CORONAVIRUS 2 (TAT 6-24 HRS)     Status: None   Collection Time: 08/21/19 11:10 PM  Result Value Ref Range Status    SARS Coronavirus 2 NEGATIVE NEGATIVE Final    Comment: (NOTE) SARS-CoV-2 target nucleic acids are NOT DETECTED. The SARS-CoV-2 RNA is generally detectable in upper and lower respiratory specimens during the acute phase of infection. Negative results do not preclude SARS-CoV-2 infection, do not rule out co-infections with other pathogens, and should not be used as the sole basis for treatment or other patient management decisions. Negative results must be combined with clinical observations, patient history, and epidemiological information. The expected result is Negative. Fact Sheet for Patients: SugarRoll.be Fact Sheet for Healthcare Providers: https://www.woods-mathews.com/ This test is not yet approved or cleared by the [place] FDA and  has been authorized for detection and/or diagnosis of SARS-CoV-2 by FDA under an Emergency Use Authorization (EUA). This EUA will remain  in effect (meaning this test can be used) for the duration  of the COVID-19 declaration under Section 56 4(b)(1) of the Act, 21 U.S.C. section 360bbb-3(b)(1), unless the authorization is terminated or revoked sooner. Performed at [facility], [place] [address] [place], [place] [address]     Procedures and diagnostic studies:  CT Angio Head W or Wo Contrast  Result Date: 08/21/2019 CLINICAL DATA:  Vertigo. EXAM: CT ANGIOGRAPHY HEAD AND NECK TECHNIQUE: Multidetector CT imaging of the head and neck was performed using the standard protocol during bolus administration of intravenous contrast. Multiplanar CT image reconstructions and MIPs were obtained to evaluate the vascular anatomy. Carotid stenosis measurements (when applicable) are obtained utilizing NASCET criteria, using the distal internal carotid diameter as the denominator. CONTRAST:  57mL OMNIPAQUE IOHEXOL 350 MG/ML SOLN COMPARISON:  None. FINDINGS: CTA NECK FINDINGS Aortic arch: Partially visualized  postsurgical changes of ascending aortic repair. Standard 3 vessel aortic arch with less than 50% narrowing of the brachiocephalic artery origin. Right carotid system: Patent with minimal calcified plaque at the carotid bifurcation. No evidence of stenosis or dissection. Tortuous mid cervical ICA. Left carotid system: Patent with minimal calcified plaque at the carotid bifurcation. No evidence of stenosis or dissection. Vertebral arteries: Patent without evidence of stenosis or dissection. Strongly dominant left vertebral artery. Skeleton: Cervical disc degeneration with advanced disc space narrowing and spurring at C5-6. Multilevel facet arthrosis, severe on the right at C7-T1. Other neck: No evidence of cervical lymphadenopathy or mass. Upper chest: Mild biapical pleuroparenchymal scarring. Review of the MIP images confirms the above findings CTA HEAD FINDINGS Anterior circulation: The internal carotid arteries are patent from skull base to carotid termini with mild nonstenotic plaque bilaterally. ACAs and MCAs are patent without evidence of proximal branch occlusion or significant proximal stenosis. No aneurysm is identified. Posterior circulation: The intracranial vertebral arteries are patent with scattered atherosclerotic plaque bilaterally resulting and mild stenosis on the right. Patent PICAs and SCAs are seen bilaterally. The basilar artery is widely patent. There is a small left posterior communicating artery. Both PCAs are patent without evidence of significant proximal stenosis. No aneurysm is identified. Venous sinuses: Patent. Anatomic variants: None. Review of the MIP images confirms the above findings IMPRESSION: Mild atherosclerosis in the head and neck without large vessel occlusion or significant stenosis. Electronically Signed   By: Logan Bores M.D.   On: 08/21/2019 17:47   DG Ribs Unilateral W/Chest Left  Result Date: 08/21/2019 CLINICAL DATA:  Rib pain since a fall out of the bathtub this  morning. EXAM: LEFT RIBS AND CHEST - 3+ VIEW COMPARISON:  Chest x-ray dated 04/13/2015 FINDINGS: No fracture or other bone lesions are seen involving the ribs. There is no evidence of pneumothorax or pleural effusion. Both lungs are clear. Heart size and mediastinal contours are within normal limits. Prosthetic aortic valve. IMPRESSION: Negative. Electronically Signed   By: Lorriane Shire M.D.   On: 08/21/2019 13:41   DG Lumbar Spine 2-3 Views  Result Date: 08/21/2019 CLINICAL DATA:  Fall out of bathtub this morning. Low back pain. EXAM: LUMBAR SPINE - 2-3 VIEW COMPARISON:  12/18/2014 lumbar spine radiographs FINDINGS: This report assumes 5 non rib-bearing lumbar vertebrae, with diminutive ribs at T12. Mild rotatory dextrocurvature of the lumbar spine. Cholecystectomy clips are seen in the right upper quadrant of the abdomen. Lumbar vertebral body heights are preserved, with no fracture. Moderate multilevel lumbar degenerative disc disease, most prominent at L4-5. No spondylolisthesis. Mild lower lumbar facet arthropathy. No aggressive appearing focal osseous lesions. Abdominal aortic atherosclerosis. IMPRESSION: No lumbar spine  fracture or spondylolisthesis. Moderate multilevel lumbar degenerative disc disease. Electronically Signed   By: Ilona Sorrel M.D.   On: 08/21/2019 13:42   DG Pelvis 1-2 Views  Result Date: 08/21/2019 CLINICAL DATA:  Fall out of bathtub this morning with low back pain EXAM: PELVIS - 1-2 VIEW COMPARISON:  10/16/2015 CT abdomen/pelvis FINDINGS: No pelvic fracture or diastasis. No evidence of hip dislocation on this single frontal view. No suspicious focal osseous lesions. Surgical clip overlies the left iliac wing. Degenerative changes in the visualized lower lumbar spine. IMPRESSION: No pelvic fracture. Electronically Signed   By: Ilona Sorrel M.D.   On: 08/21/2019 13:39   CT Head Wo Contrast  Result Date: 08/21/2019 CLINICAL DATA:  Head trauma with posterior scalp hematoma  secondary to a fall in the bathtub. The patient takes Coumadin. Loss of consciousness before the fall. EXAM: CT HEAD WITHOUT CONTRAST CT CERVICAL SPINE WITHOUT CONTRAST TECHNIQUE: Multidetector CT imaging of the head and cervical spine was performed following the standard protocol without intravenous contrast. Multiplanar CT image reconstructions of the cervical spine were also generated. COMPARISON:  CT scan of the head dated 04/06/2014 FINDINGS: CT HEAD FINDINGS Brain: No evidence of acute infarction, hemorrhage, hydrocephalus, extra-axial collection or mass lesion/mass effect. There are a few tiny areas of periventricular white matter lucency consistent with chronic small vessel ischemic disease, unchanged since the prior study. Slight asymmetry of the lateral ventricles, stable. Vascular: No hyperdense vessel or unexpected calcification. Skull: Normal. Negative for fracture or focal lesion. Sinuses/Orbits: Normal. Other: None CT CERVICAL SPINE FINDINGS Alignment: Cervical alignment is normal. 2 mm spondylolisthesis of T2 on T3 felt to be due to bilateral facet arthritis. Skull base and vertebrae: No acute fracture. No primary bone lesion or focal pathologic process. Soft tissues and spinal canal: No prevertebral fluid or swelling. No visible canal hematoma. Disc levels: C2-3 and C3-4: Normal discs. Minimal degenerative changes of the facet joints. C4-5: Tiny central disc bulge with no neural impingement. Slight degenerative changes of the left facet joint. C5-6: Marked degenerative disc disease with disc space narrowing and prominent degenerative changes of the vertebral endplates. Broad-based disc osteophyte complex without soft disc protrusion. No focal neural impingement. No significant foraminal stenosis. No facet arthritis. C6-7: Disc space narrowing. Broad-based disc osteophyte complex without neural impingement. Widely patent neural foramina. Normal facet joints. C7-T1: Normal disc. Moderately severe left  facet arthritis. No foraminal stenosis. T1-2: Normal. T2-3: Moderate right and mild left facet arthritis. 2 mm spondylolisthesis of T2 on T3 without disc bulging or disc protrusion. T3-4 and T4-5: Normal discs. Moderate right facet arthritis at each level. Upper chest: Emphysematous changes in both lung apices. Other: None IMPRESSION: 1. No acute intracranial abnormality. Minimal chronic small vessel ischemic changes in the periventricular white matter. 2. No acute abnormality of the cervical spine. Multilevel degenerative disc and joint disease. Electronically Signed   By: Lorriane Shire M.D.   On: 08/21/2019 13:24   CT Angio Neck W and/or Wo Contrast  Result Date: 08/21/2019 CLINICAL DATA:  Vertigo. EXAM: CT ANGIOGRAPHY HEAD AND NECK TECHNIQUE: Multidetector CT imaging of the head and neck was performed using the standard protocol during bolus administration of intravenous contrast. Multiplanar CT image reconstructions and MIPs were obtained to evaluate the vascular anatomy. Carotid stenosis measurements (when applicable) are obtained utilizing NASCET criteria, using the distal internal carotid diameter as the denominator. CONTRAST:  28mL OMNIPAQUE IOHEXOL 350 MG/ML SOLN COMPARISON:  None. FINDINGS: CTA NECK FINDINGS Aortic arch: Partially visualized postsurgical changes  of ascending aortic repair. Standard 3 vessel aortic arch with less than 50% narrowing of the brachiocephalic artery origin. Right carotid system: Patent with minimal calcified plaque at the carotid bifurcation. No evidence of stenosis or dissection. Tortuous mid cervical ICA. Left carotid system: Patent with minimal calcified plaque at the carotid bifurcation. No evidence of stenosis or dissection. Vertebral arteries: Patent without evidence of stenosis or dissection. Strongly dominant left vertebral artery. Skeleton: Cervical disc degeneration with advanced disc space narrowing and spurring at C5-6. Multilevel facet arthrosis, severe on the  right at C7-T1. Other neck: No evidence of cervical lymphadenopathy or mass. Upper chest: Mild biapical pleuroparenchymal scarring. Review of the MIP images confirms the above findings CTA HEAD FINDINGS Anterior circulation: The internal carotid arteries are patent from skull base to carotid termini with mild nonstenotic plaque bilaterally. ACAs and MCAs are patent without evidence of proximal branch occlusion or significant proximal stenosis. No aneurysm is identified. Posterior circulation: The intracranial vertebral arteries are patent with scattered atherosclerotic plaque bilaterally resulting and mild stenosis on the right. Patent PICAs and SCAs are seen bilaterally. The basilar artery is widely patent. There is a small left posterior communicating artery. Both PCAs are patent without evidence of significant proximal stenosis. No aneurysm is identified. Venous sinuses: Patent. Anatomic variants: None. Review of the MIP images confirms the above findings IMPRESSION: Mild atherosclerosis in the head and neck without large vessel occlusion or significant stenosis. Electronically Signed   By: Logan Bores M.D.   On: 08/21/2019 17:47   CT Cervical Spine Wo Contrast  Result Date: 08/21/2019 CLINICAL DATA:  Head trauma with posterior scalp hematoma secondary to a fall in the bathtub. The patient takes Coumadin. Loss of consciousness before the fall. EXAM: CT HEAD WITHOUT CONTRAST CT CERVICAL SPINE WITHOUT CONTRAST TECHNIQUE: Multidetector CT imaging of the head and cervical spine was performed following the standard protocol without intravenous contrast. Multiplanar CT image reconstructions of the cervical spine were also generated. COMPARISON:  CT scan of the head dated 04/06/2014 FINDINGS: CT HEAD FINDINGS Brain: No evidence of acute infarction, hemorrhage, hydrocephalus, extra-axial collection or mass lesion/mass effect. There are a few tiny areas of periventricular white matter lucency consistent with chronic  small vessel ischemic disease, unchanged since the prior study. Slight asymmetry of the lateral ventricles, stable. Vascular: No hyperdense vessel or unexpected calcification. Skull: Normal. Negative for fracture or focal lesion. Sinuses/Orbits: Normal. Other: None CT CERVICAL SPINE FINDINGS Alignment: Cervical alignment is normal. 2 mm spondylolisthesis of T2 on T3 felt to be due to bilateral facet arthritis. Skull base and vertebrae: No acute fracture. No primary bone lesion or focal pathologic process. Soft tissues and spinal canal: No prevertebral fluid or swelling. No visible canal hematoma. Disc levels: C2-3 and C3-4: Normal discs. Minimal degenerative changes of the facet joints. C4-5: Tiny central disc bulge with no neural impingement. Slight degenerative changes of the left facet joint. C5-6: Marked degenerative disc disease with disc space narrowing and prominent degenerative changes of the vertebral endplates. Broad-based disc osteophyte complex without soft disc protrusion. No focal neural impingement. No significant foraminal stenosis. No facet arthritis. C6-7: Disc space narrowing. Broad-based disc osteophyte complex without neural impingement. Widely patent neural foramina. Normal facet joints. C7-T1: Normal disc. Moderately severe left facet arthritis. No foraminal stenosis. T1-2: Normal. T2-3: Moderate right and mild left facet arthritis. 2 mm spondylolisthesis of T2 on T3 without disc bulging or disc protrusion. T3-4 and T4-5: Normal discs. Moderate right facet arthritis at each level. Upper chest:  Emphysematous changes in both lung apices. Other: None IMPRESSION: 1. No acute intracranial abnormality. Minimal chronic small vessel ischemic changes in the periventricular white matter. 2. No acute abnormality of the cervical spine. Multilevel degenerative disc and joint disease. Electronically Signed   By: Lorriane Shire M.D.   On: 08/21/2019 13:24    Medications:   . amLODipine  5 mg Oral Daily    . clonazePAM  0.5 mg Oral QHS  . fenofibrate  160 mg Oral Daily  . insulin aspart  0-9 Units Subcutaneous TID WC  . lamoTRIgine  100 mg Oral Daily   And  . lamoTRIgine  200 mg Oral QHS  . metoprolol succinate  50 mg Oral Daily  . OXcarbazepine  150 mg Oral BID  . QUEtiapine  600 mg Oral QPM  . QUEtiapine  100 mg Oral QPM  . rOPINIRole  0.5 mg Oral QHS  . venlafaxine XR  75 mg Oral Q breakfast  . warfarin  7.5 mg Oral ONCE-1600  . Warfarin - Pharmacist Dosing Inpatient   Does not apply q1800   Continuous Infusions:   LOS: 0 days   Radene Gunning NP  [facility]   How to contact the [facility] Attending or Consulting provider [place] or covering provider during after hours [place], for this patient?  1. Check the care team in [facility] and look for a) attending/consulting [facility] provider listed and b) the [facility] team listed 2. Log into [email or web address] and use [place]'s universal password to access. If you do not have the password, please contact the hospital operator. 3. Locate the [facility] provider you are looking for under [facility] and page to a number that you can be directly reached. 4. If you still have difficulty reaching the provider, please page the [facility] (Director on Call) for the Hospitalists listed on amion for assistance.  08/22/2019, 1:02 PM

## 2019-08-22 NOTE — Progress Notes (Signed)
Patient admitted to [facility] room 5 after a fall at home. Patient is alert oriented x 4, Vital  Signs within normal range. Patient is in bed locked at lower level, call light, telephone and night stand within reach.

## 2019-08-23 ENCOUNTER — Observation Stay (HOSPITAL_BASED_OUTPATIENT_CLINIC_OR_DEPARTMENT_OTHER): Payer: PPO

## 2019-08-23 DIAGNOSIS — Z20822 Contact with and (suspected) exposure to covid-19: Secondary | ICD-10-CM | POA: Diagnosis not present

## 2019-08-23 DIAGNOSIS — B9620 Unspecified Escherichia coli [E. coli] as the cause of diseases classified elsewhere: Secondary | ICD-10-CM | POA: Diagnosis not present

## 2019-08-23 DIAGNOSIS — E1142 Type 2 diabetes mellitus with diabetic polyneuropathy: Secondary | ICD-10-CM | POA: Diagnosis not present

## 2019-08-23 DIAGNOSIS — R9431 Abnormal electrocardiogram [ECG] [EKG]: Secondary | ICD-10-CM

## 2019-08-23 DIAGNOSIS — N390 Urinary tract infection, site not specified: Secondary | ICD-10-CM | POA: Diagnosis not present

## 2019-08-23 DIAGNOSIS — R001 Bradycardia, unspecified: Secondary | ICD-10-CM | POA: Diagnosis not present

## 2019-08-23 DIAGNOSIS — I4891 Unspecified atrial fibrillation: Secondary | ICD-10-CM | POA: Diagnosis not present

## 2019-08-23 DIAGNOSIS — E0590 Thyrotoxicosis, unspecified without thyrotoxic crisis or storm: Secondary | ICD-10-CM | POA: Diagnosis not present

## 2019-08-23 DIAGNOSIS — I129 Hypertensive chronic kidney disease with stage 1 through stage 4 chronic kidney disease, or unspecified chronic kidney disease: Secondary | ICD-10-CM | POA: Diagnosis not present

## 2019-08-23 DIAGNOSIS — N182 Chronic kidney disease, stage 2 (mild): Secondary | ICD-10-CM | POA: Diagnosis not present

## 2019-08-23 DIAGNOSIS — R55 Syncope and collapse: Secondary | ICD-10-CM | POA: Diagnosis not present

## 2019-08-23 DIAGNOSIS — I10 Essential (primary) hypertension: Secondary | ICD-10-CM | POA: Diagnosis not present

## 2019-08-23 DIAGNOSIS — Z952 Presence of prosthetic heart valve: Secondary | ICD-10-CM | POA: Diagnosis not present

## 2019-08-23 DIAGNOSIS — E1122 Type 2 diabetes mellitus with diabetic chronic kidney disease: Secondary | ICD-10-CM | POA: Diagnosis not present

## 2019-08-23 LAB — ECHOCARDIOGRAM COMPLETE
Height: 69 in
Weight: 3600 oz

## 2019-08-23 LAB — BASIC METABOLIC PANEL
Anion gap: 12 (ref 5–15)
BUN: 13 mg/dL (ref 8–23)
CO2: 28 mmol/L (ref 22–32)
Calcium: 9 mg/dL (ref 8.9–10.3)
Chloride: 100 mmol/L (ref 98–111)
Creatinine, Ser: 0.97 mg/dL (ref 0.44–1.00)
GFR calc Af Amer: >60 mL/min (ref >60)
GFR calc non Af Amer: >60 mL/min (ref >60)
Glucose, Bld: 113 mg/dL — ABNORMAL HIGH (ref 70–99)
Potassium: 3.6 mmol/L (ref 3.5–5.1)
Sodium: 140 mmol/L (ref 135–145)

## 2019-08-23 LAB — LIPID PANEL
Cholesterol: 257 mg/dL — ABNORMAL HIGH (ref 0–200)
HDL: 40 mg/dL — ABNORMAL LOW (ref >40)
LDL Cholesterol: UNDETERMINED mg/dL (ref 0–99)
Total CHOL/HDL Ratio: 6.4 RATIO
Triglycerides: 434 mg/dL — ABNORMAL HIGH (ref <150)
VLDL: UNDETERMINED mg/dL (ref 0–40)

## 2019-08-23 LAB — GLUCOSE, CAPILLARY
Glucose-Capillary: 112 mg/dL — ABNORMAL HIGH (ref 70–99)
Glucose-Capillary: 107 mg/dL — ABNORMAL HIGH (ref 70–99)
Glucose-Capillary: 96 mg/dL (ref 70–99)

## 2019-08-23 LAB — LDL CHOLESTEROL, DIRECT: Direct LDL: 131.8 mg/dL — ABNORMAL HIGH (ref 0–99)

## 2019-08-23 LAB — PROTIME-INR
INR: 2.6 — ABNORMAL HIGH (ref 0.8–1.2)
Prothrombin Time: 27.5 seconds — ABNORMAL HIGH (ref 11.4–15.2)

## 2019-08-23 MED ORDER — METOPROLOL SUCCINATE ER 25 MG PO TB24: 25.0000 mg | ORAL_TABLET | Freq: Every day | ORAL | Status: DC

## 2019-08-23 MED ORDER — NITROFURANTOIN MONOHYD MACRO 100 MG PO CAPS
100.0000 mg | ORAL_CAPSULE | Freq: Two times a day (BID) | ORAL | Status: DC
  Administered 2019-08-23: 100 mg via ORAL
  Filled 2019-08-23 (×2): qty 1

## 2019-08-23 MED ORDER — NITROFURANTOIN MACROCRYSTAL 100 MG PO CAPS: 100.0000 mg | ORAL_CAPSULE | Freq: Two times a day (BID) | ORAL | Status: DC

## 2019-08-23 MED ORDER — WARFARIN SODIUM 7.5 MG PO TABS
7.5000 mg | ORAL_TABLET | Freq: Once | ORAL | Status: DC
  Filled 2019-08-23: qty 1

## 2019-08-23 MED ORDER — METOPROLOL SUCCINATE ER 25 MG PO TB24: 25.0000 mg | ORAL_TABLET | Freq: Every day | ORAL | 0 refills | Status: DC

## 2019-08-23 MED ORDER — NITROFURANTOIN MONOHYD MACRO 100 MG PO CAPS: 100.0000 mg | ORAL_CAPSULE | Freq: Two times a day (BID) | ORAL | 0 refills | Status: AC

## 2019-08-23 NOTE — Progress Notes (Signed)
Telemetry called this RN right before shift change that patient's HR went down to 30's, this RN came to check patient and patient verbalized she was okay with no c/o of chest pain. EKG was also done with results of Afib (not an acute change; with history of Afib) placed results in the chart. Yellow MEWS started, HR now on 60's, MD notified and endorsed to dayshift RN.

## 2019-08-23 NOTE — TOC Transition Note (Signed)
Patient from home with daughter and son in law. Discussed OP PT , patient in agreement for Neuro Rehab and has transportation. Order placed. Transition of Care [facility]) - CM/SW Discharge Note   Patient Details  Name: [name] MRN: [id number] Date of Birth: 1951/01/20  [facility] of [facility] CM/SW Contact:  Marilu Favre, RN Phone Number: 08/23/2019, 3:01 PM   Clinical Narrative:       Final next level of care: Home/Self Care Barriers to Discharge: No Barriers Identified   Patient Goals and CMS Choice Patient states their goals for this hospitalization and ongoing recovery are:: to return to home CMS Medicare.gov Compare Post Acute Care list provided to:: Patient Choice offered to / list presented to : Patient  Discharge Placement                       Discharge Plan and Services   Discharge Planning Services: CM Consult            DME Arranged: N/A         HH Arranged: NA          Social Determinants of Health (SDOH) Interventions     Readmission Risk Interventions No flowsheet data found.

## 2019-08-23 NOTE — Progress Notes (Signed)
Progress Note  Patient Name: NILAM EASTLING Date of Encounter: 08/23/2019  Primary Cardiologist: Sinclair Grooms, MD   Subjective   Pt ambulating with PT  No CP  No SOB  Inpatient Medications    Scheduled Meds: . amLODipine  5 mg Oral Daily  . clonazePAM  0.5 mg Oral QHS  . fenofibrate  160 mg Oral Daily  . insulin aspart  0-9 Units Subcutaneous TID WC  . lamoTRIgine  100 mg Oral Daily   And  . lamoTRIgine  200 mg Oral QHS  . [START ON 08/24/2019] metoprolol succinate  25 mg Oral Daily  . nitrofurantoin (macrocrystal-monohydrate)  100 mg Oral Q12H  . OXcarbazepine  150 mg Oral BID  . rOPINIRole  0.5 mg Oral QHS  . warfarin  7.5 mg Oral ONCE-1600  . Warfarin - Pharmacist Dosing Inpatient   Does not apply q1800   Continuous Infusions:  PRN Meds: acetaminophen **OR** acetaminophen   Vital Signs    Vitals:   08/23/19 0243 08/23/19 0705 08/23/19 0857 08/23/19 1107  BP: (!) 117/56  130/80 129/75  Pulse: (!) 59 67 72 74  Resp: 16  18 18   Temp: 97.9 F (36.6 C)  98.1 F (36.7 C) 98.4 F (36.9 C)  TempSrc: Oral  Oral Oral  SpO2: 95%  96% 95%  Weight:      Height:        Intake/Output Summary (Last 24 hours) at 08/23/2019 1521 Last data filed at 08/23/2019 1300 Gross per 24 hour  Intake 720 ml  Output --  Net 720 ml   Last 3 Weights 08/21/2019 12/22/2018 12/14/2018  Weight (lbs) 225 lb 250 lb 242 lb 12.8 oz  Weight (kg) 102.059 kg 113.399 kg 110.133 kg      Telemetry    Afib - Personally Reviewed  ECG    Atrial fibrillation  61 bpm   QT normal  Personally Reviewed  Physical Exam   GEN: No acute distress.    Cardiac:  Irreg rate  Crisp valve sounds  Respiratory: Clear to auscultation bilaterally.  MS: No edema; No deformity. Neuro:  Nonfocal    Labs    High Sensitivity Troponin:  No results for input(s): TROPONINIHS in the last 720 hours.    Chemistry Recent Labs  Lab 08/21/19 1236 08/21/19 1236 08/21/19 1250 08/22/19 0435 08/23/19 0311   NA 141  --   --  141 140  K 3.7  --   --  3.4* 3.6  CL 98  --   --  98 100  CO2 27  --   --  29 28  GLUCOSE 133*  --   --  111* 113*  BUN 10  --   --  10 13  CREATININE 1.10*   < > 1.00 1.15* 0.97  CALCIUM 9.4  --   --  8.8* 9.0  PROT 7.5  --   --   --   --   ALBUMIN 4.2  --   --   --   --   AST 25  --   --   --   --   ALT 24  --   --   --   --   ALKPHOS 55  --   --   --   --   BILITOT 1.0  --   --   --   --   GFRNONAA 52*  --   --  49* >60  GFRAA 60*  --   --  57* >60  ANIONGAP 16*  --   --  14 12   < > = values in this interval not displayed.     Hematology Recent Labs  Lab 08/21/19 1236 08/22/19 0435  WBC 8.1 10.8*  RBC 4.43 4.20  HGB 13.0 12.2  HCT 40.4 39.2  MCV 91.2 93.3  MCH 29.3 29.0  MCHC 32.2 31.1  RDW 14.5 14.7  PLT 257 246    BNPNo results for input(s): BNP, PROBNP in the last 168 hours.   DDimer No results for input(s): DDIMER in the last 168 hours.   Radiology    CT Angio Head W or Wo Contrast  Result Date: 08/21/2019 CLINICAL DATA:  Vertigo. EXAM: CT ANGIOGRAPHY HEAD AND NECK TECHNIQUE: Multidetector CT imaging of the head and neck was performed using the standard protocol during bolus administration of intravenous contrast. Multiplanar CT image reconstructions and MIPs were obtained to evaluate the vascular anatomy. Carotid stenosis measurements (when applicable) are obtained utilizing NASCET criteria, using the distal internal carotid diameter as the denominator. CONTRAST:  25mL OMNIPAQUE IOHEXOL 350 MG/ML SOLN COMPARISON:  None. FINDINGS: CTA NECK FINDINGS Aortic arch: Partially visualized postsurgical changes of ascending aortic repair. Standard 3 vessel aortic arch with less than 50% narrowing of the brachiocephalic artery origin. Right carotid system: Patent with minimal calcified plaque at the carotid bifurcation. No evidence of stenosis or dissection. Tortuous mid cervical ICA. Left carotid system: Patent with minimal calcified plaque at the  carotid bifurcation. No evidence of stenosis or dissection. Vertebral arteries: Patent without evidence of stenosis or dissection. Strongly dominant left vertebral artery. Skeleton: Cervical disc degeneration with advanced disc space narrowing and spurring at C5-6. Multilevel facet arthrosis, severe on the right at C7-T1. Other neck: No evidence of cervical lymphadenopathy or mass. Upper chest: Mild biapical pleuroparenchymal scarring. Review of the MIP images confirms the above findings CTA HEAD FINDINGS Anterior circulation: The internal carotid arteries are patent from skull base to carotid termini with mild nonstenotic plaque bilaterally. ACAs and MCAs are patent without evidence of proximal branch occlusion or significant proximal stenosis. No aneurysm is identified. Posterior circulation: The intracranial vertebral arteries are patent with scattered atherosclerotic plaque bilaterally resulting and mild stenosis on the right. Patent PICAs and SCAs are seen bilaterally. The basilar artery is widely patent. There is a small left posterior communicating artery. Both PCAs are patent without evidence of significant proximal stenosis. No aneurysm is identified. Venous sinuses: Patent. Anatomic variants: None. Review of the MIP images confirms the above findings IMPRESSION: Mild atherosclerosis in the head and neck without large vessel occlusion or significant stenosis. Electronically Signed   By: Logan Bores M.D.   On: 08/21/2019 17:47   CT Angio Neck W and/or Wo Contrast  Result Date: 08/21/2019 CLINICAL DATA:  Vertigo. EXAM: CT ANGIOGRAPHY HEAD AND NECK TECHNIQUE: Multidetector CT imaging of the head and neck was performed using the standard protocol during bolus administration of intravenous contrast. Multiplanar CT image reconstructions and MIPs were obtained to evaluate the vascular anatomy. Carotid stenosis measurements (when applicable) are obtained utilizing NASCET criteria, using the distal internal  carotid diameter as the denominator. CONTRAST:  60mL OMNIPAQUE IOHEXOL 350 MG/ML SOLN COMPARISON:  None. FINDINGS: CTA NECK FINDINGS Aortic arch: Partially visualized postsurgical changes of ascending aortic repair. Standard 3 vessel aortic arch with less than 50% narrowing of the brachiocephalic artery origin. Right carotid system: Patent with minimal calcified plaque at the carotid bifurcation. No evidence of stenosis or dissection. Tortuous mid cervical ICA.  Left carotid system: Patent with minimal calcified plaque at the carotid bifurcation. No evidence of stenosis or dissection. Vertebral arteries: Patent without evidence of stenosis or dissection. Strongly dominant left vertebral artery. Skeleton: Cervical disc degeneration with advanced disc space narrowing and spurring at C5-6. Multilevel facet arthrosis, severe on the right at C7-T1. Other neck: No evidence of cervical lymphadenopathy or mass. Upper chest: Mild biapical pleuroparenchymal scarring. Review of the MIP images confirms the above findings CTA HEAD FINDINGS Anterior circulation: The internal carotid arteries are patent from skull base to carotid termini with mild nonstenotic plaque bilaterally. ACAs and MCAs are patent without evidence of proximal branch occlusion or significant proximal stenosis. No aneurysm is identified. Posterior circulation: The intracranial vertebral arteries are patent with scattered atherosclerotic plaque bilaterally resulting and mild stenosis on the right. Patent PICAs and SCAs are seen bilaterally. The basilar artery is widely patent. There is a small left posterior communicating artery. Both PCAs are patent without evidence of significant proximal stenosis. No aneurysm is identified. Venous sinuses: Patent. Anatomic variants: None. Review of the MIP images confirms the above findings IMPRESSION: Mild atherosclerosis in the head and neck without large vessel occlusion or significant stenosis. Electronically Signed   By:  Logan Bores M.D.   On: 08/21/2019 17:47   ECHOCARDIOGRAM COMPLETE  Result Date: 08/23/2019    ECHOCARDIOGRAM REPORT   Patient Name:   SAVVY WHICKER Date of Exam: 08/23/2019 Medical Rec #:  [id number]     Height:       69.0 in Accession #:    [id number]    Weight:       225.0 lb Date of Birth:  1951/05/22      BSA:          2.172 m Patient Age:    47 years      BP:           117/56 mmHg Patient Gender: F             HR:           69 bpm. Exam Location:  Inpatient Procedure: 2D Echo Indications:    Abnormal ECG R94.31  History:        Patient has prior history of Echocardiogram examinations, most                 recent 12/28/2018. Signs/Symptoms:Syncope; Risk                 Factors:Hypertension and Dyslipidemia.                 Aortic Valve: 25 mm St. Jude mechanical valve is present in the                 aortic position.  Sonographer:    Mikki Santee RDCS (AE) Referring Phys: [place]  1. Left ventricular ejection fraction, by estimation, is 45 to 50%. The left ventricle has mildly decreased function. The left ventricle demonstrates regional wall motion abnormalities (see scoring diagram/findings for description). There is mild left ventricular hypertrophy. Left ventricular diastolic parameters are indeterminate. There is moderate hypokinesis of the left ventricular, entire septal wall, inferoseptal wall and inferior wall.  2. Right ventricular systolic function is moderately reduced. The right ventricular size is normal.  3. The mitral valve is abnormal. Trivial mitral valve regurgitation.  4. The aortic valve has been repaired/replaced. Aortic valve regurgitation is trivial. There is a 25 mm St. Jude mechanical valve present in the aortic  position. Echo findings are consistent with normal structure and function of the aortic valve prosthesis.  5. 28 mm hemashield graft. Aortic root/ascending aorta has been repaired/replaced.  6. The inferior vena cava is dilated in size with >50%  respiratory variability, suggesting right atrial pressure of 8 mmHg. Comparison(s): Changes from prior study are noted. 12/28/2018: LVEF 55-60%. FINDINGS  Left Ventricle: Left ventricular ejection fraction, by estimation, is 45 to 50%. The left ventricle has mildly decreased function. The left ventricle demonstrates regional wall motion abnormalities. Moderate hypokinesis of the left ventricular, entire septal wall, inferoseptal wall and inferior wall. The left ventricular internal cavity size was normal in size. There is mild left ventricular hypertrophy. Left ventricular diastolic parameters are indeterminate. Right Ventricle: The right ventricular size is normal. No increase in right ventricular wall thickness. Right ventricular systolic function is moderately reduced. Left Atrium: Left atrial size was normal in size. Right Atrium: Right atrial size was normal in size. Pericardium: There is no evidence of pericardial effusion. Mitral Valve: The mitral valve is abnormal. There is mild thickening of the mitral valve leaflet(s). Trivial mitral valve regurgitation. Tricuspid Valve: The tricuspid valve is grossly normal. Tricuspid valve regurgitation is not demonstrated. Aortic Valve: The aortic valve has been repaired/replaced. Aortic valve regurgitation is trivial. Aortic valve mean gradient measures 9.3 mmHg. Aortic valve peak gradient measures 19.4 mmHg. There is a 25 mm St. Jude mechanical valve present in the aortic position. Echo findings are consistent with normal structure and function of the aortic valve prosthesis. Pulmonic Valve: The pulmonic valve was normal in structure. Pulmonic valve regurgitation is not visualized. Aorta: 28 mm hemashield graft. The aortic root/ascending aorta has been repaired/replaced. Venous: The inferior vena cava is dilated in size with greater than 50% respiratory variability, suggesting right atrial pressure of 8 mmHg. IAS/Shunts: No atrial level shunt detected by color flow  Doppler.  LEFT VENTRICLE PLAX 2D LVIDd:         3.90 cm Diastology LVIDs:         3.30 cm LV e' lateral:   11.30 cm/s LV PW:         1.10 cm LV E/e' lateral: 9.5 LV IVS:        1.10 cm LV e' medial:    8.59 cm/s                        LV E/e' medial:  12.5  RIGHT VENTRICLE RV S prime:     6.83 cm/s TAPSE (M-mode): 1.4 cm LEFT ATRIUM             Index       RIGHT ATRIUM           Index LA diam:        3.70 cm 1.70 cm/m  RA Area:     10.10 cm LA Vol (A2C):   75.3 ml 34.67 ml/m RA Volume:   16.40 ml  7.55 ml/m LA Vol (A4C):   67.7 ml 31.17 ml/m LA Biplane Vol: 71.4 ml 32.87 ml/m  AORTIC VALVE AV Vmax:           220.00 cm/s AV Vmean:          144.000 cm/s AV VTI:            0.417 m AV Peak Grad:      19.4 mmHg AV Mean Grad:      9.3 mmHg LVOT Vmax:  116.00 cm/s LVOT Vmean:        76.100 cm/s LVOT VTI:          0.223 m LVOT/AV VTI ratio: 0.54  AORTA Ao Root diam: 3.10 cm MITRAL VALVE MV Area (PHT): 3.54 cm     SHUNTS MV Decel Time: 214 msec     Systemic VTI: 0.22 m MV E velocity: 107.50 cm/s MV A velocity: 44.10 cm/s MV E/A ratio:  2.44 Lyman Bishop MD Electronically signed by Lyman Bishop MD Signature Date/Time: 08/23/2019/12:09:17 PM    Final     Cardiac Studies   Left ventricular ejection fraction, by estimation, is 45 to 50%. The left ventricle has mildly decreased function. The left ventricle demonstrates regional wall motion abnormalities (see scoring diagram/findings for description). There is mild left ventricular hypertrophy. Left ventricular diastolic parameters are indeterminate. There is moderate hypokinesis of the left ventricular, entire septal wall, inferoseptal wall and inferior wall. 2. Right ventricular systolic function is moderately reduced. The right ventricular size is normal. 3. The mitral valve is abnormal. Trivial mitral valve regurgitation. 4. The aortic valve has been repaired/replaced. Aortic valve regurgitation is trivial. There is a 25 mm St. Jude mechanical valve  present in the aortic position. Echo findings are consistent with normal structure and function of the aortic valve prosthesis. 5. 28 mm hemashield graft. Aortic root/ascending aorta has been repaired/replaced. 6. The inferior vena cava is dilated in size with >50% respiratory variability, suggesting right atrial pressure of 8 mmHg. Comparison(s): Changes from prior study are noted. 12/28/2018: LVEF 55-60%.  Patient Profile     69 y.o. female   [place]    1 ? QT prolongation  I do not think pt had any signif QT prolongation   Initial EKG with low voltage, not accurate  Recent EKG OK  I have reviewed echo images from today as well as AUg 2020  Today's study echo windows are diffiuclt I do not think there is a significant difference  The endocardium is difficult to see  Basal inferor wall in both studies is a little hypokinetic   I would call overall LV funciton normal   To confirm would need Definity but would not do now  Otherwise AV prosthesis is moving well  The pt has no CP  No dizziness She remains in Afib (rate controlled)  OK for D/C with follow up as outpatient with Dr Tamala Julian   For questions or updates, please contact [facility] Please consult [email or web address] for contact info under        Signed, Dorris Carnes, MD  08/23/2019, 3:21 PM

## 2019-08-23 NOTE — Progress Notes (Signed)
Physical Therapy Treatment Patient Details Name: [name] MRN: [id number] DOB: 1950/07/21 Today's Date: 08/23/2019    History of Present Illness Pt is 69 yo female with PMH including mechanical aortic valve, afib, bipolar disorder, DM, kidney disease stage II.  Pt presented to ED after 2 falls getting in/out of bathtub.  On 1 fall hit posterior head.  Per chart possible near syncope, prolonged QTC on EKG and on Seroquel and Effexor.  Per cardiology notes, repeat TTE and possible 14 day event monitor.  CT head and Cspine were negative for acute changes (CT pelvis and spine also negative).  Pt reports she was told she has concussion.    PT Comments    Pt progressed well with therapy goals stating no further onset of dizziness since Epley maneuver was performed by PT last session. Pt educated on rationale behind dizziness and treatment verbalizing understanding.  Pt demonstrated L trendelenburg gait, utilized self release techniques and pre-gait exercises to address. Pt progressed with ambulation, improving distance to 274ft with HHA and 1-2 instances of near LOB with adequate steppage for reactive balance requiring no assistance to maintain. Pt educated on further progression of LB management and verbalized understanding. Pt educated of use of SPC for improved offloading of L hip for improved low back pain management.  Pt can continue to benefit from skilled PT to address functional deficits listed below. PT will continue following acutely until transfer to next level of care.   Follow Up Recommendations  Outpatient PT;Supervision - Intermittent     Equipment Recommendations  None recommended by PT    Recommendations for Other Services       Precautions / Restrictions Precautions Precautions: Fall Precaution Comments: orthostatic BP Restrictions Weight Bearing Restrictions: No    Mobility  Bed Mobility Overal bed mobility: Needs Assistance Bed Mobility: Supine to Sit;Sit to  Supine     Supine to sit: Min guard Sit to supine: Supervision   General bed mobility comments: Pt required no assistance today for bed mobility, CGA for safety with initial sitting balance, no near LOB noted  Transfers Overall transfer level: Needs assistance Equipment used: None Transfers: Sit to/from Stand Sit to Stand: Min guard         General transfer comment: MinG for safety  Ambulation/Gait Ambulation/Gait assistance: Min assist Gait Distance (Feet): 225 Feet Assistive device: 1 person hand held assist Gait Pattern/deviations: Step-through pattern;Decreased step length - left;Decreased stance time - left;Staggering left;Trendelenburg(L trendelenburg) Gait velocity: decreased   General Gait Details: No LOB but patient reaches out for support multiple times prior to HHA being offered and put moderate weight through R UE        Balance Overall balance assessment: Needs assistance Sitting-balance support: No upper extremity supported;Feet supported Sitting balance-Leahy Scale: Good     Standing balance support: No upper extremity supported;During functional activity Standing balance-Leahy Scale: Fair      Cognition Arousal/Alertness: Awake/alert Behavior During Therapy: WFL for tasks assessed/performed Overall Cognitive Status: Within Functional Limits for tasks assessed      Exercises General Exercises - Lower Extremity Hip ABduction/ADduction: AROM;Both;20 reps;Other (comment)(Glut med release L hip) Other Exercises Other Exercises: Hip hike L x 10 Other Exercises: Pre-gait at wall with emphasis on improving L hip control/level with step phase of gait x 20 Other Exercises: Low trunk rotations x 30 pain free ROM Other Exercises: SKTC x 10 ea    General Comments General comments (skin integrity, edema, etc.): VSS throughgout with HR responding appropriately with ambulation &  mobility      Pertinent Vitals/Pain Pain Assessment: Faces Faces Pain Scale:  Hurts even more Pain Location: head, low back Pain Descriptors / Indicators: Aching           PT Goals (current goals can now be found in the care plan section) Acute Rehab PT Goals Patient Stated Goal: return home; feel better PT Goal Formulation: With patient Time For Goal Achievement: 09/05/19 Potential to Achieve Goals: Good Progress towards PT goals: Progressing toward goals    Frequency    Min 3X/week      PT Plan Current plan remains appropriate       AM-PAC PT "6 Clicks" Mobility   Outcome Measure  Help needed turning from your back to your side while in a flat bed without using bedrails?: None Help needed moving from lying on your back to sitting on the side of a flat bed without using bedrails?: None Help needed moving to and from a bed to a chair (including a wheelchair)?: A Little Help needed standing up from a chair using your arms (e.g., wheelchair or bedside chair)?: A Little Help needed to walk in hospital room?: A Little Help needed climbing 3-5 steps with a railing? : A Little 6 Click Score: 20    End of Session Equipment Utilized During Treatment: Gait belt Activity Tolerance: Patient tolerated treatment well Patient left: in bed;with bed alarm set;with call bell/phone within reach Nurse Communication: Mobility status PT Visit Diagnosis: Unsteadiness on feet (R26.81);History of falling (Z91.81);BPPV BPPV - Right/Left : Right     Time: [id number] PT Time Calculation (min) (ACUTE ONLY): 39 min  Charges:  $Gait Training: 8-22 mins $Therapeutic Exercise: 8-22 mins $Therapeutic Activity: 8-22 mins                     Ann Held PT, DPT Acute Rehab [facility] P: [phone]    Miyoko Hashimi A Hawthorne Day 08/23/2019, 11:20 AM

## 2019-08-23 NOTE — Discharge Summary (Addendum)
Physician Discharge Summary  [name] [id number] DOB: 09/28/1950 DOA: 08/21/2019  PCP: Mosie Lukes, MD  Admit date: 08/21/2019 Discharge date: 08/23/2019  Admitted From: home Discharge disposition: home   Recommendations for Outpatient Follow-Up:   1. Follow-up with cardiology as scheduled for evaluation of EKG and specifically QTc prolongation and review of echo. Also metoprolol dose decreased at discharge due to bradycardia 2. Follow-up with primary psychiatrist 1 to 2 weeks for evaluation of medication changes if Qtc prolonged 3. Outpatient PT recommended by physical therapy 4. Follow-up with primary care provider 1 to 2 weeks to ensure urinary tract infection resolved 5. Routine monitoring of INR   Discharge Diagnosis:   Principal Problem:   Near syncope Active Problems:   Hx of mechanical aortic valve replacement   Hyperthyroidism   UTI (urinary tract infection)   Atrial fibrillation (HCC)   Essential hypertension, benign   Type 2 diabetes mellitus with diabetic polyneuropathy, without long-term current use of insulin (HCC)   Prolonged QT interval   Bradycardia    Discharge Condition: Improved.  Diet recommendation: Low sodium, heart healthy.  Carbohydrate-modified.    Wound care: None.  Code status: Full.   History of Present Illness:   [name] is a 69 y.o. female with history of mechanical aortic valve, A. fib, bipolar disorder, diabetes mellitus, chronic kidney disease stage II, hyperlipidemia presented to the ER on March 28 after had a second fall in the previous 1 week.  Patient stated her first fall was about the early part of the last week when patient slipped from the tub and fell.  Second 1 was the day prior to presentation when patient again fell on the bathtub.  This time she fell backwards and hit her head.  Patient stated she is not sure of the cause for the fall she feels she may have lost her balance.  Both  time she did not  lose consciousness.  Has not had any chest pain or shortness of breath or palpitation prior or after the fall.  Since the fall patient felt mildly nauseous and also occipital headache.  Patient stated over the  2 weeks patient has been having some diarrhea 1 or 2 episodes.  Denied any sick contacts or using any antibiotics.  5 days prior patient had some abdominal pain which wa completely resolved.  At the time patient declined coming to the ER.   Hospital Course by Problem:   #1.  Near syncope/fall.  Etiology unclear but patient does have a prolonged QTC on EKG and home medications include Seroquel and Effexor which are known offenders.  CT of her head without acute abnormalities. Echo with ejection fraction of 45 to 50% and mildly decreased left ventricle function, mild left ventricular hypertrophy, left ventricle demonstrates regional wall motion abnormalities, moderate hypokinesis of the left ventricle, right ventricular systolic function moderately reduced.  No signs of infection.    Potassium and magnesium low end of normal and were repleted.  No chest pain.  Evaluated by physical therapy who recommend outpatient therapy.    #2.  Prolonged QT.  See #1.   -Suspect the EKG that showed QTC prolongation was not actually picking up the correct QRS/T as the baseline was very flat   #3.  History of mechanical valve and A. fib.  Home medications include Coumadin and metoprolol.  INR 2.1.  #4.  Chronic kidney disease stage II.  Creatinine 0.97 on day of discharge .  #5.  Bipolar disorder and anxiety.  Home medications include Seroquel, Effexor, Klonopin, Lamictal, Trileptal. -Patient to meet with cardiology next Wednesday we will asked him to do an EKG and if QTC is prolonged on that EKG patient will need to be seen by psychiatry and have medications adjusted  #6.  Urinary tract infection.  Culture reveals greater than 100,000 E. Coli.  she is afebrile hemodynamically stable and  nontoxic-appearing. Macrodantin 7 days.  Follow-up with PCP 1 to 2 weeks for repeat urinalysis to ensure resolution of UTI  #7.  Bradycardia. Tele reveals HR range 40-55.  Home medications include metoprolol XR 50 mg daily.  We will reduce this to 25 mg daily at discharge.  She has an appointment with her primary cardiologist in 1 week.    Medical Consultants:   cardiology   Discharge Exam:   Vitals:   08/23/19 1107 08/23/19 1500  BP: 129/75 (!) 153/85  Pulse: 74 71  Resp: 18 18  Temp: 98.4 F (36.9 C) 98.1 F (36.7 C)  SpO2: 95% 97%   Vitals:   08/23/19 0705 08/23/19 0857 08/23/19 1107 08/23/19 1500  BP:  130/80 129/75 (!) 153/85  Pulse: 67 72 74 71  Resp:  18 18 18   Temp:  98.1 F (36.7 C) 98.4 F (36.9 C) 98.1 F (36.7 C)  TempSrc:  Oral Oral Oral  SpO2:  96% 95% 97%  Weight:      Height:        General exam: Appears calm and comfortable. Sitting up in bed watching TV Respiratory system: Clear to auscultation. Respiratory effort normal. Cardiovascular system: S1 & S2 heard, RRR. No JVD,  +click. No murmurs. Gastrointestinal system: Abdomen is nondistended, soft and nontender. No organomegaly or masses felt. Normal bowel sounds heard. Central nervous system: Alert and oriented. No focal neurological deficits. Extremities: No clubbing,  or cyanosis. No edema. Skin: No rashes, lesions or ulcers. Psychiatry: Judgement and insight appear normal. Mood & affect appropriate.    The results of significant diagnostics from this hospitalization (including imaging, microbiology, ancillary and laboratory) are listed below for reference.     Procedures and Diagnostic Studies:   CT Angio Head W or Wo Contrast  Result Date: 08/21/2019 CLINICAL DATA:  Vertigo. EXAM: CT ANGIOGRAPHY HEAD AND NECK TECHNIQUE: Multidetector CT imaging of the head and neck was performed using the standard protocol during bolus administration of intravenous contrast. Multiplanar CT image  reconstructions and MIPs were obtained to evaluate the vascular anatomy. Carotid stenosis measurements (when applicable) are obtained utilizing NASCET criteria, using the distal internal carotid diameter as the denominator. CONTRAST:  26mL OMNIPAQUE IOHEXOL 350 MG/ML SOLN COMPARISON:  None. FINDINGS: CTA NECK FINDINGS Aortic arch: Partially visualized postsurgical changes of ascending aortic repair. Standard 3 vessel aortic arch with less than 50% narrowing of the brachiocephalic artery origin. Right carotid system: Patent with minimal calcified plaque at the carotid bifurcation. No evidence of stenosis or dissection. Tortuous mid cervical ICA. Left carotid system: Patent with minimal calcified plaque at the carotid bifurcation. No evidence of stenosis or dissection. Vertebral arteries: Patent without evidence of stenosis or dissection. Strongly dominant left vertebral artery. Skeleton: Cervical disc degeneration with advanced disc space narrowing and spurring at C5-6. Multilevel facet arthrosis, severe on the right at C7-T1. Other neck: No evidence of cervical lymphadenopathy or mass. Upper chest: Mild biapical pleuroparenchymal scarring. Review of the MIP images confirms the above findings CTA HEAD FINDINGS Anterior circulation: The internal carotid arteries are patent from skull base to carotid  termini with mild nonstenotic plaque bilaterally. ACAs and MCAs are patent without evidence of proximal branch occlusion or significant proximal stenosis. No aneurysm is identified. Posterior circulation: The intracranial vertebral arteries are patent with scattered atherosclerotic plaque bilaterally resulting and mild stenosis on the right. Patent PICAs and SCAs are seen bilaterally. The basilar artery is widely patent. There is a small left posterior communicating artery. Both PCAs are patent without evidence of significant proximal stenosis. No aneurysm is identified. Venous sinuses: Patent. Anatomic variants: None.  Review of the MIP images confirms the above findings IMPRESSION: Mild atherosclerosis in the head and neck without large vessel occlusion or significant stenosis. Electronically Signed   By: Logan Bores M.D.   On: 08/21/2019 17:47   DG Ribs Unilateral W/Chest Left  Result Date: 08/21/2019 CLINICAL DATA:  Rib pain since a fall out of the bathtub this morning. EXAM: LEFT RIBS AND CHEST - 3+ VIEW COMPARISON:  Chest x-ray dated 04/13/2015 FINDINGS: No fracture or other bone lesions are seen involving the ribs. There is no evidence of pneumothorax or pleural effusion. Both lungs are clear. Heart size and mediastinal contours are within normal limits. Prosthetic aortic valve. IMPRESSION: Negative. Electronically Signed   By: Lorriane Shire M.D.   On: 08/21/2019 13:41   DG Lumbar Spine 2-3 Views  Result Date: 08/21/2019 CLINICAL DATA:  Fall out of bathtub this morning. Low back pain. EXAM: LUMBAR SPINE - 2-3 VIEW COMPARISON:  12/18/2014 lumbar spine radiographs FINDINGS: This report assumes 5 non rib-bearing lumbar vertebrae, with diminutive ribs at T12. Mild rotatory dextrocurvature of the lumbar spine. Cholecystectomy clips are seen in the right upper quadrant of the abdomen. Lumbar vertebral body heights are preserved, with no fracture. Moderate multilevel lumbar degenerative disc disease, most prominent at L4-5. No spondylolisthesis. Mild lower lumbar facet arthropathy. No aggressive appearing focal osseous lesions. Abdominal aortic atherosclerosis. IMPRESSION: No lumbar spine fracture or spondylolisthesis. Moderate multilevel lumbar degenerative disc disease. Electronically Signed   By: Ilona Sorrel M.D.   On: 08/21/2019 13:42   DG Pelvis 1-2 Views  Result Date: 08/21/2019 CLINICAL DATA:  Fall out of bathtub this morning with low back pain EXAM: PELVIS - 1-2 VIEW COMPARISON:  10/16/2015 CT abdomen/pelvis FINDINGS: No pelvic fracture or diastasis. No evidence of hip dislocation on this single frontal view.  No suspicious focal osseous lesions. Surgical clip overlies the left iliac wing. Degenerative changes in the visualized lower lumbar spine. IMPRESSION: No pelvic fracture. Electronically Signed   By: Ilona Sorrel M.D.   On: 08/21/2019 13:39   CT Head Wo Contrast  Result Date: 08/21/2019 CLINICAL DATA:  Head trauma with posterior scalp hematoma secondary to a fall in the bathtub. The patient takes Coumadin. Loss of consciousness before the fall. EXAM: CT HEAD WITHOUT CONTRAST CT CERVICAL SPINE WITHOUT CONTRAST TECHNIQUE: Multidetector CT imaging of the head and cervical spine was performed following the standard protocol without intravenous contrast. Multiplanar CT image reconstructions of the cervical spine were also generated. COMPARISON:  CT scan of the head dated 04/06/2014 FINDINGS: CT HEAD FINDINGS Brain: No evidence of acute infarction, hemorrhage, hydrocephalus, extra-axial collection or mass lesion/mass effect. There are a few tiny areas of periventricular white matter lucency consistent with chronic small vessel ischemic disease, unchanged since the prior study. Slight asymmetry of the lateral ventricles, stable. Vascular: No hyperdense vessel or unexpected calcification. Skull: Normal. Negative for fracture or focal lesion. Sinuses/Orbits: Normal. Other: None CT CERVICAL SPINE FINDINGS Alignment: Cervical alignment is normal. 2 mm spondylolisthesis of  T2 on T3 felt to be due to bilateral facet arthritis. Skull base and vertebrae: No acute fracture. No primary bone lesion or focal pathologic process. Soft tissues and spinal canal: No prevertebral fluid or swelling. No visible canal hematoma. Disc levels: C2-3 and C3-4: Normal discs. Minimal degenerative changes of the facet joints. C4-5: Tiny central disc bulge with no neural impingement. Slight degenerative changes of the left facet joint. C5-6: Marked degenerative disc disease with disc space narrowing and prominent degenerative changes of the  vertebral endplates. Broad-based disc osteophyte complex without soft disc protrusion. No focal neural impingement. No significant foraminal stenosis. No facet arthritis. C6-7: Disc space narrowing. Broad-based disc osteophyte complex without neural impingement. Widely patent neural foramina. Normal facet joints. C7-T1: Normal disc. Moderately severe left facet arthritis. No foraminal stenosis. T1-2: Normal. T2-3: Moderate right and mild left facet arthritis. 2 mm spondylolisthesis of T2 on T3 without disc bulging or disc protrusion. T3-4 and T4-5: Normal discs. Moderate right facet arthritis at each level. Upper chest: Emphysematous changes in both lung apices. Other: None IMPRESSION: 1. No acute intracranial abnormality. Minimal chronic small vessel ischemic changes in the periventricular white matter. 2. No acute abnormality of the cervical spine. Multilevel degenerative disc and joint disease. Electronically Signed   By: Lorriane Shire M.D.   On: 08/21/2019 13:24   CT Angio Neck W and/or Wo Contrast  Result Date: 08/21/2019 CLINICAL DATA:  Vertigo. EXAM: CT ANGIOGRAPHY HEAD AND NECK TECHNIQUE: Multidetector CT imaging of the head and neck was performed using the standard protocol during bolus administration of intravenous contrast. Multiplanar CT image reconstructions and MIPs were obtained to evaluate the vascular anatomy. Carotid stenosis measurements (when applicable) are obtained utilizing NASCET criteria, using the distal internal carotid diameter as the denominator. CONTRAST:  68mL OMNIPAQUE IOHEXOL 350 MG/ML SOLN COMPARISON:  None. FINDINGS: CTA NECK FINDINGS Aortic arch: Partially visualized postsurgical changes of ascending aortic repair. Standard 3 vessel aortic arch with less than 50% narrowing of the brachiocephalic artery origin. Right carotid system: Patent with minimal calcified plaque at the carotid bifurcation. No evidence of stenosis or dissection. Tortuous mid cervical ICA. Left carotid  system: Patent with minimal calcified plaque at the carotid bifurcation. No evidence of stenosis or dissection. Vertebral arteries: Patent without evidence of stenosis or dissection. Strongly dominant left vertebral artery. Skeleton: Cervical disc degeneration with advanced disc space narrowing and spurring at C5-6. Multilevel facet arthrosis, severe on the right at C7-T1. Other neck: No evidence of cervical lymphadenopathy or mass. Upper chest: Mild biapical pleuroparenchymal scarring. Review of the MIP images confirms the above findings CTA HEAD FINDINGS Anterior circulation: The internal carotid arteries are patent from skull base to carotid termini with mild nonstenotic plaque bilaterally. ACAs and MCAs are patent without evidence of proximal branch occlusion or significant proximal stenosis. No aneurysm is identified. Posterior circulation: The intracranial vertebral arteries are patent with scattered atherosclerotic plaque bilaterally resulting and mild stenosis on the right. Patent PICAs and SCAs are seen bilaterally. The basilar artery is widely patent. There is a small left posterior communicating artery. Both PCAs are patent without evidence of significant proximal stenosis. No aneurysm is identified. Venous sinuses: Patent. Anatomic variants: None. Review of the MIP images confirms the above findings IMPRESSION: Mild atherosclerosis in the head and neck without large vessel occlusion or significant stenosis. Electronically Signed   By: Logan Bores M.D.   On: 08/21/2019 17:47   CT Cervical Spine Wo Contrast  Result Date: 08/21/2019 CLINICAL DATA:  Head trauma  with posterior scalp hematoma secondary to a fall in the bathtub. The patient takes Coumadin. Loss of consciousness before the fall. EXAM: CT HEAD WITHOUT CONTRAST CT CERVICAL SPINE WITHOUT CONTRAST TECHNIQUE: Multidetector CT imaging of the head and cervical spine was performed following the standard protocol without intravenous contrast.  Multiplanar CT image reconstructions of the cervical spine were also generated. COMPARISON:  CT scan of the head dated 04/06/2014 FINDINGS: CT HEAD FINDINGS Brain: No evidence of acute infarction, hemorrhage, hydrocephalus, extra-axial collection or mass lesion/mass effect. There are a few tiny areas of periventricular white matter lucency consistent with chronic small vessel ischemic disease, unchanged since the prior study. Slight asymmetry of the lateral ventricles, stable. Vascular: No hyperdense vessel or unexpected calcification. Skull: Normal. Negative for fracture or focal lesion. Sinuses/Orbits: Normal. Other: None CT CERVICAL SPINE FINDINGS Alignment: Cervical alignment is normal. 2 mm spondylolisthesis of T2 on T3 felt to be due to bilateral facet arthritis. Skull base and vertebrae: No acute fracture. No primary bone lesion or focal pathologic process. Soft tissues and spinal canal: No prevertebral fluid or swelling. No visible canal hematoma. Disc levels: C2-3 and C3-4: Normal discs. Minimal degenerative changes of the facet joints. C4-5: Tiny central disc bulge with no neural impingement. Slight degenerative changes of the left facet joint. C5-6: Marked degenerative disc disease with disc space narrowing and prominent degenerative changes of the vertebral endplates. Broad-based disc osteophyte complex without soft disc protrusion. No focal neural impingement. No significant foraminal stenosis. No facet arthritis. C6-7: Disc space narrowing. Broad-based disc osteophyte complex without neural impingement. Widely patent neural foramina. Normal facet joints. C7-T1: Normal disc. Moderately severe left facet arthritis. No foraminal stenosis. T1-2: Normal. T2-3: Moderate right and mild left facet arthritis. 2 mm spondylolisthesis of T2 on T3 without disc bulging or disc protrusion. T3-4 and T4-5: Normal discs. Moderate right facet arthritis at each level. Upper chest: Emphysematous changes in both lung  apices. Other: None IMPRESSION: 1. No acute intracranial abnormality. Minimal chronic small vessel ischemic changes in the periventricular white matter. 2. No acute abnormality of the cervical spine. Multilevel degenerative disc and joint disease. Electronically Signed   By: Lorriane Shire M.D.   On: 08/21/2019 13:24     Labs:   Basic Metabolic Panel: Recent Labs  Lab 08/21/19 1236 08/21/19 1236 08/21/19 1250 08/22/19 0435 08/23/19 0311  NA 141  --   --  141 140  K 3.7   < >  --  3.4* 3.6  CL 98  --   --  98 100  CO2 27  --   --  29 28  GLUCOSE 133*  --   --  111* 113*  BUN 10  --   --  10 13  CREATININE 1.10*  --  1.00 1.15* 0.97  CALCIUM 9.4  --   --  8.8* 9.0  MG 1.7  --   --   --   --    < > = values in this interval not displayed.   GFR Estimated Creatinine Clearance: 70.6 mL/min (by C-G formula based on SCr of 0.97 mg/dL). Liver Function Tests: Recent Labs  Lab 08/21/19 1236  AST 25  ALT 24  ALKPHOS 55  BILITOT 1.0  PROT 7.5  ALBUMIN 4.2   Recent Labs  Lab 08/21/19 1236  LIPASE 33   No results for input(s): AMMONIA in the last 168 hours. Coagulation profile Recent Labs  Lab 08/19/19 1428 08/21/19 1236 08/22/19 0435 08/23/19 0311  INR 1.9* 2.0*  2.1* 2.6*    CBC: Recent Labs  Lab 08/21/19 1236 08/22/19 0435  WBC 8.1 10.8*  NEUTROABS 6.1  --   HGB 13.0 12.2  HCT 40.4 39.2  MCV 91.2 93.3  PLT 257 246   Cardiac Enzymes: No results for input(s): CKTOTAL, CKMB, CKMBINDEX, TROPONINI in the last 168 hours. BNP: Invalid input(s): POCBNP CBG: Recent Labs  Lab 08/22/19 1645 08/22/19 2055 08/23/19 0759 08/23/19 1156 08/23/19 1653  GLUCAP 129* 122* 112* 107* 96   D-Dimer No results for input(s): DDIMER in the last 72 hours. Hgb A1c No results for input(s): HGBA1C in the last 72 hours. Lipid Profile Recent Labs    08/23/19 0312  CHOL 257*  HDL 40*  LDLCALC UNABLE TO CALCULATE IF TRIGLYCERIDE OVER 400 mg/dL  TRIG 434*  CHOLHDL 6.4    LDLDIRECT 131.8*   Thyroid function studies No results for input(s): TSH, T4TOTAL, T3FREE, THYROIDAB in the last 72 hours.  Invalid input(s): FREET3 Anemia work up No results for input(s): VITAMINB12, FOLATE, FERRITIN, TIBC, IRON, RETICCTPCT in the last 72 hours. Microbiology Recent Results (from the past 240 hour(s))  Culture, Urine     Status: Abnormal (Preliminary result)   Collection Time: 08/21/19  2:23 PM   Specimen: Urine, Random  Result Value Ref Range Status   Specimen Description URINE, RANDOM  Final   Special Requests NONE  Final   Culture (A)  Final    >=100,000 COLONIES/mL ESCHERICHIA COLI SUSCEPTIBILITIES TO FOLLOW Performed at [facility], [address], [place], [place] [address]    Report Status PENDING  Incomplete  SARS CORONAVIRUS 2 (TAT 6-24 HRS)     Status: None   Collection Time: 08/21/19 11:10 PM  Result Value Ref Range Status   SARS Coronavirus 2 NEGATIVE NEGATIVE Final    Comment: (NOTE) SARS-CoV-2 target nucleic acids are NOT DETECTED. The SARS-CoV-2 RNA is generally detectable in upper and lower respiratory specimens during the acute phase of infection. Negative results do not preclude SARS-CoV-2 infection, do not rule out co-infections with other pathogens, and should not be used as the sole basis for treatment or other patient management decisions. Negative results must be combined with clinical observations, patient history, and epidemiological information. The expected result is Negative. Fact Sheet for Patients: SugarRoll.be Fact Sheet for Healthcare Providers: https://www.woods-mathews.com/ This test is not yet approved or cleared by the [place] FDA and  has been authorized for detection and/or diagnosis of SARS-CoV-2 by FDA under an Emergency Use Authorization (EUA). This EUA will remain  in effect (meaning this test can be used) for the duration of the COVID-19 declaration under  Section 56 4(b)(1) of the Act, 21 U.S.C. section 360bbb-3(b)(1), unless the authorization is terminated or revoked sooner. Performed at [facility], [place] [address] [place], [place] [address]      Discharge Instructions:   Discharge Instructions    Ambulatory referral to Physical Therapy   Complete by: As directed    Iontophoresis - 4 mg/ml of dexamethasone: No   T.E.N.S. Unit Evaluation and Dispense as Indicated: No   Discharge instructions   Complete by: As directed    Outpatient PT EKG at next card visit Limited echo  to be done as well     Allergies as of 08/23/2019      Reactions   Mucinex [guaifenesin Er] Other (See Comments)   Severe headaches   Keflex [cephalexin] Other (See Comments)   Headaches and dizziness   Sulfonamide Derivatives Other (See  Comments)   Headaches      Medication List    STOP taking these medications   enoxaparin 120 MG/0.8ML injection Commonly known as: Lovenox     TAKE these medications   acetaminophen 500 MG tablet Commonly known as: TYLENOL Take 1,000-1,500 mg by mouth daily as needed (for knee pain).   amLODipine 5 MG tablet Commonly known as: NORVASC Take 1 tablet (5 mg total) by mouth daily.   aspirin EC 81 MG tablet Take 81 mg by mouth daily.   BIOTIN PO Take 1 tablet by mouth daily with breakfast.   CENTRUM SILVER 50+WOMEN PO Take 1 tablet by mouth daily with breakfast.   clonazePAM 0.5 MG tablet Commonly known as: KLONOPIN Take 1 tablet (0.5 mg total) by mouth at bedtime.   fenofibrate micronized 130 MG capsule Commonly known as: ANTARA Take 1 capsule (130 mg total) by mouth daily before breakfast.   furosemide 40 MG tablet Commonly known as: LASIX Take 1 tablet (40 mg total) by mouth daily.   glucose blood test strip Commonly known as: OneTouch Verio Check blood sugars once daily Dx: E11.9   lamoTRIgine 100 MG tablet Commonly known as: LAMICTAL Take 100 mg by mouth in the morning and 200 mg by  mouth at bedtime What changed:   how much to take  how to take this  when to take this   metFORMIN 500 MG 24 hr tablet Commonly known as: Glucophage XR Take 2 tablets (1,000 mg total) by mouth 2 (two) times daily with a meal. What changed:   how much to take  when to take this   metoprolol succinate 25 MG 24 hr tablet Commonly known as: TOPROL-XL Take 1 tablet (25 mg total) by mouth daily. Start taking on: August 24, 2019 What changed:   medication strength  how much to take  additional instructions   nitrofurantoin (macrocrystal-monohydrate) 100 MG capsule Commonly known as: MACROBID Take 1 capsule (100 mg total) by mouth every 12 (twelve) hours for 7 days.   ONE TOUCH LANCETS Misc Test once daily to check blood sugar. DX E11.9   onetouch ultrasoft lancets Use as instructed to test blood sugars once daily Dx. E11.65   OXcarbazepine 150 MG tablet Commonly known as: TRILEPTAL Take 1 tablet (150 mg total) by mouth 2 (two) times daily.   pantoprazole 40 MG tablet Commonly known as: PROTONIX Take 1 tablet (40 mg total) by mouth daily.   QUEtiapine 100 MG tablet Commonly known as: SEROquel Take 1/2-1 tab po QHS What changed:   how much to take  how to take this  when to take this  additional instructions   QUEtiapine 300 MG 24 hr tablet Commonly known as: SEROQUEL XR Take 2 tablets (600 mg total) by mouth every evening. What changed: Another medication with the same name was changed. Make sure you understand how and when to take each.   rOPINIRole 0.25 MG tablet Commonly known as: REQUIP Take 1-2 tablets (0.25-0.5 mg total) by mouth at bedtime. What changed: how much to take   rosuvastatin 40 MG tablet Commonly known as: CRESTOR Take 1 tablet (40 mg total) by mouth daily.   SUMAtriptan 50 MG tablet Commonly known as: IMITREX TAKE ONE TABLET EVERY 2 HOURS AS NEEDED FOR MIGRAINE. MAY REPEAT IN 2 HOURS IF HEADACHE PERSISTS OR RECURS. (MAX OF 2  TABLETS IN 24 HOURS) What changed: See the new instructions.   traMADol 50 MG tablet Commonly known as: ULTRAM Take 50 mg by  mouth every 6 (six) hours as needed (for pain).   venlafaxine XR 75 MG 24 hr capsule Commonly known as: Effexor XR Take 1 capsule (75 mg total) by mouth daily with breakfast.   warfarin 5 MG tablet Commonly known as: COUMADIN Take as directed. If you are unsure how to take this medication, talk to your nurse or doctor. Original instructions: Take 5mg  daily except 2.5mg  on Monday and Wednesday or take as directed by Coumadin Clinic. What changed:   how much to take  how to take this  when to take this  additional instructions      Follow-up Information    [facility]. Schedule an appointment as soon as possible for a visit.   Specialty: Rehabilitation Contact information: [address] [place] [id number] [place] [phone]           Time coordinating discharge: 45 minutes  Signed:  Eulogio Bear DO [facility] 08/23/2019, 5:18 PM

## 2019-08-23 NOTE — Plan of Care (Signed)

## 2019-08-23 NOTE — Progress Notes (Signed)
  Echocardiogram 2D Echocardiogram has been performed.  Jennette Dubin 08/23/2019, 10:35 AM

## 2019-08-23 NOTE — Progress Notes (Signed)
Discharge: Pt d/c from room via wheelchair, Family member with the pt. Discharge instructions given to the patient and family members.  No questions from pt, reintegrated to the pt to call or go to the ED for chest discomfort. Pt dressed in street clothes and left with discharge papers and prescriptions in hand. IV d/ced, tele removed and no complaints of pain or discomfort. 

## 2019-08-23 NOTE — Progress Notes (Signed)
ANTICOAGULATION CONSULT NOTE  Pharmacy Consult for Warfarin  Indication: Atrial fibrillation and mechanical AVR  Allergies  Allergen Reactions  . Mucinex [Guaifenesin Er] Other (See Comments)    Severe headaches   . Keflex [Cephalexin] Other (See Comments)    Headaches and dizziness  . Sulfonamide Derivatives Other (See Comments)    Headaches     Patient Measurements: Height: 5\' 9"  (175.3 cm) Weight: 225 lb (102.1 kg) IBW/kg (Calculated) : 66.2  Vital Signs: Temp: 98.4 F (36.9 C) (03/30 1107) Temp Source: Oral (03/30 1107) BP: 129/75 (03/30 1107) Pulse Rate: 74 (03/30 1107)  Labs: Recent Labs    08/21/19 1236 08/21/19 1236 08/21/19 1250 08/22/19 0435 08/23/19 0311  HGB 13.0  --   --  12.2  --   HCT 40.4  --   --  39.2  --   PLT 257  --   --  246  --   LABPROT 22.3*  --   --  23.2* 27.5*  INR 2.0*  --   --  2.1* 2.6*  CREATININE 1.10*   < > 1.00 1.15* 0.97   < > = values in this interval not displayed.    Estimated Creatinine Clearance: 70.6 mL/min (by C-G formula based on SCr of 0.97 mg/dL).  Assessment: 69 y/o F on warfarin PTA for afib and mechanical AVR presents to the ED s/p fall in the bath tub. CT head/cervical spine is negative.   Warfarin PTA dosing per outpatient anti-coag notes: Warfarin 7.5 mg on Monday and Friday, 5 mg all other days  Yesterday's warfarin dose was 7.5mg  and today's INR is therapeutic at 2.6 (goal 2.5-3.5).    Goal of Therapy:  INR 2.5-3.5, per outpatient anti-coag notes Monitor platelets by anticoagulation protocol: Yes   Plan:  Repeat warfarin 7.5mg  PO x 1 tonight Monitor daily INR, CBC, s/s bleeding  Shela Commons, PharmD, BCPS Clinical Pharmacist Please see AMION for all pharmacy numbers 08/23/2019 2:46 PM

## 2019-08-23 NOTE — Progress Notes (Signed)
PT Cancellation Note  Patient Details Name: YENI RENTON MRN: [id number] DOB: Feb 17, 1951   Cancelled Treatment:    Reason Eval/Treat Not Completed: Patient at procedure or test/unavailable Pt at procedure and currently not available. Will check back as time allows.  Ann Held PT, DPT Acute Rehab [facility] P: [phone]   Myrle Sheng A Axtyn Woehler 08/23/2019, 10:05 AM

## 2019-08-24 LAB — URINE CULTURE: Culture: >=100000 — AB

## 2019-08-29 ENCOUNTER — Other Ambulatory Visit: Payer: Self-pay

## 2019-08-29 DIAGNOSIS — F411 Generalized anxiety disorder: Secondary | ICD-10-CM

## 2019-08-29 DIAGNOSIS — F3181 Bipolar II disorder: Secondary | ICD-10-CM

## 2019-08-29 MED ORDER — OXCARBAZEPINE 150 MG PO TABS: 150.0000 mg | ORAL_TABLET | Freq: Two times a day (BID) | ORAL | 0 refills | Status: DC

## 2019-08-29 MED ORDER — QUETIAPINE FUMARATE ER 300 MG PO TB24: 600.0000 mg | ORAL_TABLET | Freq: Every evening | ORAL | 0 refills | Status: DC

## 2019-08-30 NOTE — Progress Notes (Signed)
Cardiology Office Note:    Date:  08/31/2019   ID:  [name], DOB [birth year], MRN [id number]  PCP:  Mosie Lukes, MD  Cardiologist:  Sinclair Grooms, MD   Referring MD: Mosie Lukes, MD   Chief Complaint  Patient presents with  . Atrial Fibrillation  . Cardiac Valve Problem    History of Present Illness:    [name] is a 69 y.o. female with a hx of post aortic valve replacement with a mechanical prosthesis 2011, history of recurrent atrial fibrillation, hyperlipidemia, diabetes mellitus type 2, and chronic anticoagulation therapy with Coumadin. Chronic diastolic HF.  Recently hospitalized with Near Syncope: "Follow-up with cardiology as scheduled for evaluation of EKG and specifically QTc prolongation and review of echo. Also metoprolol dose decreased at discharge due to bradycardia"  During the recent hospital stay, metoprolol succinate was decreased to 25 mg/day because of heart rates in the 40s and 50s.  She comes in today with complaint that her heart is pounding and her heart rates have stayed above 100 bpm since being at home.  She has had no recurrence of near syncope.  She denies chest pain, orthopnea, and PND.  She complains of being fatigued and tired all the time.  She lives alone and does not know if she snores.  Past Medical History:  Diagnosis Date  . ANEMIA 05/28/2010  . AORTIC VALVE REPLACEMENT, HX OF 03/11/2010   Mechanical prosthesis  . BIPOLAR AFFECTIVE DISORDER 03/11/2010  . Depression   . Diverticulitis 12/18/2010  . FIBROIDS, UTERUS 03/11/2010  . Gout 09/04/2016  . Grave's disease 8-12  . Hyperlipidemia associated with type 2 diabetes mellitus ([place]) 06/05/2016  . Hypertension   . Low back pain 11/02/2017  . Migraine 06/05/2016  . Mixed hyperlipidemia 03/11/2010  . Obesity   . Skin cancer   . Syncope 08/22/2019  . UTI (lower urinary tract infection) 08/14/2011    Past Surgical History:  Procedure Laterality Date  . ABDOMINAL  HYSTERECTOMY  ?1998   sb/l spo, total for fibroids/heavy bleeding  . ANKLE SURGERY Left 01/2018   hardware removal  . AORTIC VALVE REPLACEMENT  2011   Mechanical prosthesis, [employer]  . APPENDECTOMY     Required revision for EColi infection, required recurrent packing  . bowel obstruction     Requiring adhesions to be removed  . CHOLECYSTECTOMY    . COLONOSCOPY WITH PROPOFOL N/A 11/01/2015   Procedure: COLONOSCOPY WITH PROPOFOL;  Surgeon: Milus Banister, MD;  Location: [facility];  Service: Endoscopy;  Laterality: N/A;  . CYSTOCELE REPAIR N/A 10/16/2015   Procedure: ANTERIOR REPAIR (CYSTOCELE);  Surgeon: Donnamae Jude, MD;  Location: [facility];  Service: Gynecology;  Laterality: N/A;  . HERNIA REPAIR  08-16-10  . LEFT HEART CATHETERIZATION WITH CORONARY ANGIOGRAM N/A 12/05/2011   Procedure: LEFT HEART CATHETERIZATION WITH CORONARY ANGIOGRAM;  Surgeon: Sinclair Grooms, MD;  Location: [facility] CATH LAB;  Service: Cardiovascular;  Laterality: N/A;  . TOTAL KNEE ARTHROPLASTY Right 02/24/2013   Procedure: RIGHT TOTAL KNEE ARTHROPLASTY;  Surgeon: Johnn Hai, MD;  Location: [facility];  Service: Orthopedics;  Laterality: Right;  . TUBAL LIGATION    . varicose vein surgery b/l legs      Current Medications: Current Meds  Medication Sig  . acetaminophen (TYLENOL) 500 MG tablet Take 1,000-1,500 mg by mouth daily as needed (for knee pain).   Marland Kitchen amLODipine (NORVASC) 5 MG tablet Take 1 tablet (5 mg total) by mouth  daily.  . aspirin EC 81 MG tablet Take 81 mg by mouth daily.  Marland Kitchen BIOTIN PO Take 1 tablet by mouth daily with breakfast.  . clonazePAM (KLONOPIN) 0.5 MG tablet Take 1 tablet (0.5 mg total) by mouth at bedtime.  . fenofibrate micronized (ANTARA) 130 MG capsule Take 1 capsule (130 mg total) by mouth daily before breakfast.  . furosemide (LASIX) 40 MG tablet Take 1 tablet (40 mg total) by mouth daily.  Marland Kitchen glucose blood (ONETOUCH VERIO) test strip Check blood sugars once daily Dx: E11.9  . lamoTRIgine  (LAMICTAL) 100 MG tablet Take 100 mg by mouth in the morning and 200 mg by mouth at bedtime  . Lancets (ONETOUCH ULTRASOFT) lancets Use as instructed to test blood sugars once daily Dx. E11.65  . metFORMIN (GLUCOPHAGE-XR) 500 MG 24 hr tablet Take 500 mg by mouth 2 (two) times daily.  . Multiple Vitamins-Minerals (CENTRUM SILVER 50+WOMEN PO) Take 1 tablet by mouth daily with breakfast.  . ONE TOUCH LANCETS MISC Test once daily to check blood sugar. DX E11.9  . OXcarbazepine (TRILEPTAL) 150 MG tablet Take 1 tablet (150 mg total) by mouth 2 (two) times daily.  . pantoprazole (PROTONIX) 40 MG tablet Take 1 tablet (40 mg total) by mouth daily.  . QUEtiapine (SEROQUEL XR) 300 MG 24 hr tablet Take 2 tablets (600 mg total) by mouth every evening.  Marland Kitchen QUEtiapine (SEROQUEL) 100 MG tablet Take 1/2-1 tab po QHS  . rOPINIRole (REQUIP) 0.25 MG tablet Take 1-2 tablets (0.25-0.5 mg total) by mouth at bedtime.  . rosuvastatin (CRESTOR) 40 MG tablet Take 1 tablet (40 mg total) by mouth daily.  . SUMAtriptan (IMITREX) 50 MG tablet TAKE ONE TABLET EVERY 2 HOURS AS NEEDED FOR MIGRAINE. MAY REPEAT IN 2 HOURS IF HEADACHE PERSISTS OR RECURS. (MAX OF 2 TABLETS IN 24 HOURS)  . traMADol (ULTRAM) 50 MG tablet Take 50 mg by mouth every 6 (six) hours as needed (for pain).   Marland Kitchen venlafaxine XR (EFFEXOR XR) 75 MG 24 hr capsule Take 1 capsule (75 mg total) by mouth daily with breakfast.  . warfarin (COUMADIN) 5 MG tablet Take 5mg  daily except 2.5mg  on Monday and Wednesday or take as directed by Coumadin Clinic.  . [DISCONTINUED] metoprolol succinate (TOPROL-XL) 25 MG 24 hr tablet Take 1 tablet (25 mg total) by mouth daily.     Allergies:   Mucinex [guaifenesin er], Keflex [cephalexin], and Sulfonamide derivatives   Social History   Socioeconomic History  . Marital status: Married    Spouse name: Not on file  . Number of children: Not on file  . Years of education: Not on file  . Highest education level: Not on file    Occupational History  . Not on file  Tobacco Use  . Smoking status: Former Smoker    Quit date: 05/26/1990    Years since quitting: 29.2  . Smokeless tobacco: Never Used  Substance and Sexual Activity  . Alcohol use: Yes    Alcohol/week: 0.0 standard drinks    Comment: rare  . Drug use: No  . Sexual activity: Not Currently  Other Topics Concern  . Not on file  Social History Narrative  . Not on file   Social Determinants of Health   Financial Resource Strain:   . Difficulty of Paying Living Expenses:   Food Insecurity:   . Worried About Charity fundraiser in the Last Year:   . [place] in the Last Year:  Transportation Needs:   . Film/video editor (Medical):   Marland Kitchen Lack of Transportation (Non-Medical):   Physical Activity:   . Days of Exercise per Week:   . Minutes of Exercise per Session:   Stress:   . Feeling of Stress :   Social Connections:   . Frequency of Communication with Friends and Family:   . Frequency of Social Gatherings with Friends and Family:   . Attends Religious Services:   . Active Member of Clubs or Organizations:   . Attends Archivist Meetings:   Marland Kitchen Marital Status:      Family History: The patient's family history includes Alcohol abuse in her brother, brother, father, and maternal grandfather; Aneurysm in her mother; Arthritis in her mother; Cancer in her maternal grandmother; Depression in her brother, brother, paternal grandfather, paternal grandmother, sister, and sister; Diabetes in her maternal grandfather, paternal grandfather, and paternal grandmother; Heart disease in her brother and maternal grandfather; Scoliosis in her mother.  ROS:   Please see the history of present illness.    Fatigue, difficulty ambulating and doing any physical activity that is sustained.  She has felt nervous since discharge from the hospital because her heart is pounding.  All other systems reviewed and are negative.  EKGs/Labs/Other Studies  Reviewed:     The following studies were reviewed today: 2D Doppler echocardiogram August 23, 2019: IMPRESSIONS    1. Left ventricular ejection fraction, by estimation, is 45 to 50%. The  left ventricle has mildly decreased function. The left ventricle  demonstrates regional wall motion abnormalities (see scoring  diagram/findings for description). There is mild left  ventricular hypertrophy. Left ventricular diastolic parameters are  indeterminate. There is moderate hypokinesis of the left ventricular,  entire septal wall, inferoseptal wall and inferior wall.  2. Right ventricular systolic function is moderately reduced. The right  ventricular size is normal.  3. The mitral valve is abnormal. Trivial mitral valve regurgitation.  4. The aortic valve has been repaired/replaced. Aortic valve  regurgitation is trivial. There is a 25 mm St. Jude mechanical valve  present in the aortic position. Echo findings are consistent with normal  structure and function of the aortic valve  prosthesis.  5. 28 mm hemashield graft. Aortic root/ascending aorta has been  repaired/replaced.  6. The inferior vena cava is dilated in size with >50% respiratory  variability, suggesting right atrial pressure of 8 mmHg.    EKG:  EKG atrial fibrillation with rapid ventricular response, LVH with strain, QTC 462 ms and QT 330 ms.  Last prior EKG revealed A. fib at a rate of 59 bpm on 08/23/2019  Recent Labs: 08/21/2019: ALT 24; Magnesium 1.7 08/22/2019: Hemoglobin 12.2; Platelets 246 08/23/2019: BUN 13; Creatinine, Ser 0.97; Potassium 3.6; Sodium 140  Recent Lipid Panel    Component Value Date/Time   CHOL 257 (H) 08/23/2019 0312   TRIG 434 (H) 08/23/2019 0312   HDL 40 (L) 08/23/2019 0312   CHOLHDL 6.4 08/23/2019 0312   VLDL UNABLE TO CALCULATE IF TRIGLYCERIDE OVER 400 mg/dL 08/23/2019 0312   LDLCALC UNABLE TO CALCULATE IF TRIGLYCERIDE OVER 400 mg/dL 08/23/2019 0312   LDLDIRECT 131.8 (H) 08/23/2019  0312    Physical Exam:    VS:  BP (!) 148/74   Pulse (!) 118   Ht 5\' 9"  (1.753 m)   Wt 221 lb (100.2 kg)   SpO2 97%   BMI 32.64 kg/m     Wt Readings from Last 3 Encounters:  08/31/19 221 lb (100.2  kg)  08/21/19 225 lb (102.1 kg)  12/22/18 250 lb (113.4 kg)     GEN: Morbidly obese. No acute distress HEENT: Normal NECK: No JVD. LYMPHATICS: No lymphadenopathy CARDIAC: Irregularly irregular RR with mechanical valve closure sound, 2/6 systolic but no diastolic murmur, gallop, or edema. VASCULAR:  Normal Pulses. No bruits. RESPIRATORY:  Clear to auscultation without rales, wheezing or rhonchi  ABDOMEN: Soft, non-tender, non-distended, No pulsatile mass, MUSCULOSKELETAL: No deformity  SKIN: Warm and dry NEUROLOGIC:  Alert and oriented x 3 PSYCHIATRIC:  Normal affect   ASSESSMENT:    1. Atrial fibrillation, unspecified type ([place])   2. Long term current use of anticoagulant therapy   3. Hx of mechanical aortic valve replacement   4. Chronic combined systolic and diastolic heart failure ([place])   5. Essential hypertension, benign   6. Prolonged QT interval   7. Educated about COVID-19 virus infection   8. Daytime sleepiness   9. Other fatigue    PLAN:    In order of problems listed above:  1. Chronic.  Recent reduction of metoprolol due to in-hospital heart rates running between 45 and 60 bpm.  Metoprolol was decreased to 25 mg/day.  With heart rates as ER today, metoprolol will be increased back to 50 mg/day.  1 week later a 2-week continuous monitor will be started to gauge heart rate control.  She may need more rate control depending upon the findings. 2. Continue warfarin therapy.  No current complications. 3. Aortic valve function seemed normal. 4. No evidence of volume overload.  Decrease EF to the 40 to [id number] range of uncertain cause.  Could certainly be rate related to tachycardia. 5. Blood pressure is high.  The increase in metoprolol dose will also help that. 6. QT  today is not prolonged. 7. COVID-19 social distancing and vaccine are accepted by the patient. 8. Her complaints of excessive fatigue, excessive daytime sleepiness, inability to sleep raises a question of obstructive sleep apnea.  A sleep study will need to be done.\  She will need to follow-up in 4 to 6 weeks after the monitor is done.    Medication Adjustments/Labs and Tests Ordered: Current medicines are reviewed at length with the patient today.  Concerns regarding medicines are outlined above.  Orders Placed This Encounter  Procedures  . Cardiac event monitor  . EKG 12-Lead  . Split night study   Meds ordered this encounter  Medications  . metoprolol succinate (TOPROL-XL) 50 MG 24 hr tablet    Sig: Take 1 tablet (50 mg total) by mouth daily. Take with or immediately following a meal.    Dispense:  90 tablet    Refill:  3    Dose change    Patient Instructions  Medication Instructions:  1) INCREASE Metoprolol Succinate to 50mg  once daily  *If you need a refill on your cardiac medications before your next appointment, please call your pharmacy*   Lab Work: None If you have labs (blood work) drawn today and your tests are completely normal, you will receive your results only by: Marland Kitchen MyChart Message (if you have MyChart) OR . A paper copy in the mail If you have any lab test that is abnormal or we need to change your treatment, we will call you to review the results.   Testing/Procedures: Your physician has recommended that you wear an event monitor for 2 weeks. Event monitors are medical devices that record the heart's electrical activity. Doctors most often [place] these monitors to diagnose arrhythmias.  Arrhythmias are problems with the speed or rhythm of the heartbeat. The monitor is a small, portable device. You can wear one while you do your normal daily activities. This is usually used to diagnose what is causing palpitations/syncope (passing out).  Your physician has  recommended that you have a sleep study. This test records several body functions during sleep, including: brain activity, eye movement, oxygen and carbon dioxide blood levels, heart rate and rhythm, breathing rate and rhythm, the flow of air through your mouth and nose, snoring, body muscle movements, and chest and belly movement.   Follow-Up: At [facility], you and your health needs are our priority.  As part of our continuing mission to provide you with exceptional heart care, we have created designated Provider Care Teams.  These Care Teams include your primary Cardiologist (physician) and Advanced Practice Providers (APPs -  Physician Assistants and Nurse Practitioners) who all work together to provide you with the care you need, when you need it.  We recommend signing up for the patient portal called "MyChart".  Sign up information is provided on this After Visit Summary.  MyChart is used to connect with patients for Virtual Visits (Telemedicine).  Patients are able to view lab/test results, encounter notes, upcoming appointments, etc.  Non-urgent messages can be sent to your provider as well.   To learn more about what you can do with MyChart, go to [email or web address].    Your next appointment:   4-6 week(s)  The format for your next appointment:   In Person  Provider:   You may see Sinclair Grooms, MD or one of the following Advanced Practice Providers on your designated Care Team:    Truitt Merle, NP  Cecilie Kicks, NP  Kathyrn Drown, NP    Other Instructions      Signed, Sinclair Grooms, MD  08/31/2019 Howie Ill PM    [place]

## 2019-08-31 ENCOUNTER — Ambulatory Visit (INDEPENDENT_AMBULATORY_CARE_PROVIDER_SITE_OTHER): Payer: PPO | Admitting: Interventional Cardiology

## 2019-08-31 ENCOUNTER — Encounter: Payer: Self-pay | Admitting: Interventional Cardiology

## 2019-08-31 ENCOUNTER — Encounter: Payer: Self-pay | Admitting: *Deleted

## 2019-08-31 ENCOUNTER — Other Ambulatory Visit: Payer: Self-pay

## 2019-08-31 ENCOUNTER — Ambulatory Visit (INDEPENDENT_AMBULATORY_CARE_PROVIDER_SITE_OTHER): Payer: PPO | Admitting: *Deleted

## 2019-08-31 VITALS — BP 148/74 | HR 118 | Ht 69.0 in | Wt 221.0 lb

## 2019-08-31 DIAGNOSIS — I4891 Unspecified atrial fibrillation: Secondary | ICD-10-CM | POA: Diagnosis not present

## 2019-08-31 DIAGNOSIS — Z952 Presence of prosthetic heart valve: Secondary | ICD-10-CM

## 2019-08-31 DIAGNOSIS — R9431 Abnormal electrocardiogram [ECG] [EKG]: Secondary | ICD-10-CM | POA: Diagnosis not present

## 2019-08-31 DIAGNOSIS — Z5181 Encounter for therapeutic drug level monitoring: Secondary | ICD-10-CM

## 2019-08-31 DIAGNOSIS — Z7901 Long term (current) use of anticoagulants: Secondary | ICD-10-CM | POA: Diagnosis not present

## 2019-08-31 DIAGNOSIS — R400 Somnolence: Secondary | ICD-10-CM | POA: Diagnosis not present

## 2019-08-31 DIAGNOSIS — I10 Essential (primary) hypertension: Secondary | ICD-10-CM | POA: Diagnosis not present

## 2019-08-31 DIAGNOSIS — R5383 Other fatigue: Secondary | ICD-10-CM | POA: Diagnosis not present

## 2019-08-31 DIAGNOSIS — I5042 Chronic combined systolic (congestive) and diastolic (congestive) heart failure: Secondary | ICD-10-CM | POA: Diagnosis not present

## 2019-08-31 DIAGNOSIS — Z7189 Other specified counseling: Secondary | ICD-10-CM | POA: Diagnosis not present

## 2019-08-31 LAB — POCT INR: INR: 2.9 (ref 2.0–3.0)

## 2019-08-31 MED ORDER — METOPROLOL SUCCINATE ER 50 MG PO TB24: 50.0000 mg | ORAL_TABLET | Freq: Every day | ORAL | 3 refills | Status: DC

## 2019-08-31 NOTE — Progress Notes (Addendum)
Patient ID: [name], female   DOB: 10/29/1950, 69 y.o.   MRN: [id number] Patient enrolled for [employer] to ship a 14 day cardiac event monitor to her home.  Instructions sent to patient via My Chart Message and will be included in her monitor kit.

## 2019-08-31 NOTE — Patient Instructions (Signed)
Description   Continue taking 1 tablet daily except 1.5 tablets on Mondays and Fridays. Recheck INR in 2 weeks. Coumadin Clinic [phone]

## 2019-08-31 NOTE — Patient Instructions (Signed)
Medication Instructions:  1) INCREASE Metoprolol Succinate to 50mg  once daily  *If you need a refill on your cardiac medications before your next appointment, please call your pharmacy*   Lab Work: None If you have labs (blood work) drawn today and your tests are completely normal, you will receive your results only by: Marland Kitchen MyChart Message (if you have MyChart) OR . A paper copy in the mail If you have any lab test that is abnormal or we need to change your treatment, we will call you to review the results.   Testing/Procedures: Your physician has recommended that you wear an event monitor for 2 weeks. Event monitors are medical devices that record the heart's electrical activity. Doctors most often [place] these monitors to diagnose arrhythmias. Arrhythmias are problems with the speed or rhythm of the heartbeat. The monitor is a small, portable device. You can wear one while you do your normal daily activities. This is usually used to diagnose what is causing palpitations/syncope (passing out).  Your physician has recommended that you have a sleep study. This test records several body functions during sleep, including: brain activity, eye movement, oxygen and carbon dioxide blood levels, heart rate and rhythm, breathing rate and rhythm, the flow of air through your mouth and nose, snoring, body muscle movements, and chest and belly movement.   Follow-Up: At [facility], you and your health needs are our priority.  As part of our continuing mission to provide you with exceptional heart care, we have created designated Provider Care Teams.  These Care Teams include your primary Cardiologist (physician) and Advanced Practice Providers (APPs -  Physician Assistants and Nurse Practitioners) who all work together to provide you with the care you need, when you need it.  We recommend signing up for the patient portal called "MyChart".  Sign up information is provided on this After Visit Summary.   MyChart is used to connect with patients for Virtual Visits (Telemedicine).  Patients are able to view lab/test results, encounter notes, upcoming appointments, etc.  Non-urgent messages can be sent to your provider as well.   To learn more about what you can do with MyChart, go to [email or web address].    Your next appointment:   4-6 week(s)  The format for your next appointment:   In Person  Provider:   You may see Sinclair Grooms, MD or one of the following Advanced Practice Providers on your designated Care Team:    Truitt Merle, NP  Cecilie Kicks, NP  Kathyrn Drown, NP    Other Instructions

## 2019-09-01 ENCOUNTER — Telehealth: Payer: Self-pay | Admitting: Interventional Cardiology

## 2019-09-01 ENCOUNTER — Telehealth: Payer: Self-pay | Admitting: *Deleted

## 2019-09-01 MED ORDER — METOPROLOL SUCCINATE ER 100 MG PO TB24: 100.0000 mg | ORAL_TABLET | Freq: Every day | ORAL | 3 refills | Status: DC

## 2019-09-01 NOTE — Telephone Encounter (Signed)
-----   Message from Loren Racer, RN sent at 08/31/2019  2:15 PM EDT ----- Sleep study ordered

## 2019-09-01 NOTE — Telephone Encounter (Signed)
Patient would like to speak to nurse.  She states that when her mother was in for her visit yesterday some wrong information was given.

## 2019-09-01 NOTE — Telephone Encounter (Signed)
Spoke with Dr. Tamala Julian and he said to increase Metoprolol to 100mg  QD.  Spoke with daughter and made her aware of recommendations.  Daughter verbalized understanding and was in agreement with this plan.

## 2019-09-01 NOTE — Telephone Encounter (Signed)
Spoke with daughter and she states that when they got home yesterday, they realized that pt had been taking Metoprolol Succinate 50mg  since she was released from the hospital.  She never decreased the dose.  Advised I would talk to Dr. Tamala Julian and see if he wants to make further changes before pt wears monitor and I will call back.  Daughter appreciative for call.

## 2019-09-02 ENCOUNTER — Other Ambulatory Visit: Payer: Self-pay | Admitting: *Deleted

## 2019-09-02 MED ORDER — ROPINIROLE HCL 0.25 MG PO TABS: 0.2500 mg | ORAL_TABLET | Freq: Every day | ORAL | 0 refills | Status: DC

## 2019-09-02 NOTE — Telephone Encounter (Signed)
Patient called back and daughter was speaking loudly in the background per Tiffany about why does she needs an office visit.  Patient hung up before call was transferred.  Called back and had to leave another message, but I left a detailed message to explain why appointment was needed.  Last ov was 09/2018 and at that time Dr. Charlett Blake wanted patient to come back in 2 months.  It is now coming up on almost  year without a follow up, so we need to get patient in for an appointment.  Advised to call back again to schedule.

## 2019-09-02 NOTE — Telephone Encounter (Signed)
Left message on machine for patient to call back to schedule.  Follow up appointment. Last seen May 2020

## 2019-09-02 NOTE — Telephone Encounter (Signed)
Amy is calling stating [name] has been even dizzier and fell again today since Dr. Tamala Julian increased her medication. Amy would like someone to call her back in regards to this before closing today. Please advise.

## 2019-09-02 NOTE — Telephone Encounter (Signed)
Pt increased Metoprolol Succinate to 100mg  yesterday.  Yesterday she took 50mg  BID, today she took 100mg  this morning.  Daughter states pt has been much more "wobbly" today.  She did have another fall today.  Did not hit head.  Pt c/o worsening dizziness.  Pt did have a recent concussion.  Daughter was at the store purchasing a BP cuff when I spoke with her, so unable to check pressure until she gets back home.  Advised daughter to check BP when she gets home and if too low, increase fluids for today to help bring that number up.  Advised daughter to decrease Metoprolol Succinate back to 50mg  until we could hear back from Dr. Tamala Julian.  Made daughter aware if any severe changes, report to ER and if no changes in dizziness, she could contact the on call service this weekend.  Daughter verbalized understanding and was appreciative for call.

## 2019-09-04 NOTE — Telephone Encounter (Signed)
I need more info. She was dizzy but did she have low BP or Low heart rate?

## 2019-09-05 NOTE — Telephone Encounter (Signed)
30-day continuous monitor to assess bradycardia/tachycardia and symptoms of dizziness.  Continue to monitor the blood pressure at home.  Continue metoprolol succinate 50 mg/day.  Notify us of extremely elevated blood pressures consistently above 155/90 mmHg.

## 2019-09-05 NOTE — Telephone Encounter (Signed)
Daughter did not check vitals.  When I spoke with her, she was at the pharmacy purchasing a BP cuff.

## 2019-09-05 NOTE — Telephone Encounter (Signed)
Spoke with daughter and made her aware of recommendations.  We had previously told pt to wear monitor for 14 days.  Dr. Tamala Julian said let's have her do the full 30 days.  Will make monitor team aware of change incase they need to do anything on their end.  Daughter appreciative for call.

## 2019-09-05 NOTE — Telephone Encounter (Signed)
Spoke with daughter and she states dizziness has improved since decreasing Metoprolol.  BPs on 50mg  of Metoprolol were 110-140/??.  She is not at home so couldn't look up exact readings.  HRs fluctuated from "really slow" to "really fast".  Daughter states that you can hear that it is irregular (she is a Marine scientist).  Advised that I will update Dr. Tamala Julian and call back with any recommendations.

## 2019-09-12 NOTE — Telephone Encounter (Signed)
I will send call to the monitor tech to reach to the pt's daughter in regards to monitor. See previous notes.

## 2019-09-12 NOTE — Telephone Encounter (Signed)
[employer] was called to check on status of shipping monitor.  [employer] stated they had attempted to contact patient several times to confirm shipping address without response.  [employer] was supplied daughter, Amy's, telephone number as a new contact. Patients daughter was called and informed of above.  She is aware to expect phone call from [place] confirming shipping address.

## 2019-09-12 NOTE — Telephone Encounter (Signed)
Patient's daughter, Amy, states she is calling to follow up in regards to the status of holter monitor. She states the patient has not yet received monitor. Please call.

## 2019-09-14 ENCOUNTER — Encounter: Payer: Self-pay | Admitting: Interventional Cardiology

## 2019-09-14 ENCOUNTER — Ambulatory Visit (INDEPENDENT_AMBULATORY_CARE_PROVIDER_SITE_OTHER): Payer: PPO

## 2019-09-14 ENCOUNTER — Telehealth: Payer: Self-pay | Admitting: Student

## 2019-09-14 DIAGNOSIS — I4891 Unspecified atrial fibrillation: Secondary | ICD-10-CM | POA: Diagnosis not present

## 2019-09-14 DIAGNOSIS — R9431 Abnormal electrocardiogram [ECG] [EKG]: Secondary | ICD-10-CM | POA: Diagnosis not present

## 2019-09-14 NOTE — Telephone Encounter (Signed)
   Received page from [place] at [employer] of critical EKG. Returned call. Herbie Baltimore states patient had pushed the "passing out" button. [employer] called patient and patient states she accidentally pushed the button. No symptoms at the time. EKG at the time showed atrial flutter with rates in the 70's to 80's with variable conduction. Patient already on anticoagulation. No need for medication changes at this time.   Darreld Mclean, PA-C 09/14/2019 6:54 PM

## 2019-09-15 ENCOUNTER — Other Ambulatory Visit: Payer: Self-pay

## 2019-09-15 ENCOUNTER — Telehealth: Payer: Self-pay

## 2019-09-15 ENCOUNTER — Ambulatory Visit (INDEPENDENT_AMBULATORY_CARE_PROVIDER_SITE_OTHER): Payer: PPO

## 2019-09-15 ENCOUNTER — Other Ambulatory Visit: Payer: Self-pay | Admitting: *Deleted

## 2019-09-15 DIAGNOSIS — Z952 Presence of prosthetic heart valve: Secondary | ICD-10-CM

## 2019-09-15 DIAGNOSIS — I4891 Unspecified atrial fibrillation: Secondary | ICD-10-CM

## 2019-09-15 DIAGNOSIS — Z5181 Encounter for therapeutic drug level monitoring: Secondary | ICD-10-CM | POA: Diagnosis not present

## 2019-09-15 LAB — POCT INR: INR: 2.7 (ref 2.0–3.0)

## 2019-09-15 MED ORDER — WARFARIN SODIUM 5 MG PO TABS: ORAL_TABLET | ORAL | 1 refills | Status: DC

## 2019-09-15 MED ORDER — METOPROLOL SUCCINATE ER 50 MG PO TB24: 50.0000 mg | ORAL_TABLET | Freq: Every day | ORAL | 3 refills | Status: DC

## 2019-09-15 NOTE — Telephone Encounter (Signed)
Received monitor alert from [employer] for aflutter episode 09/14/2019.  Alert was addressed by Sande Rives, PA-C. (See encounter)  Pt is anticoagulated.

## 2019-09-15 NOTE — Patient Instructions (Signed)
Description   Continue on same dosage 1 tablet daily except 1.5 tablets on Mondays and Fridays. Recheck INR in 3 weeks. [facility] [phone]

## 2019-09-15 NOTE — Telephone Encounter (Addendum)
Spoke with pt and pt's daughter re: [employer] monitor alert received from 09/15/2019 9am for Atrial Fib RVR sustained, VT (21 beats).  Pt reports no symptoms and states during that timeframe she had climbed a flight of stairs.  Monitor alert taken to Dr Rayann Heman, DOD for review.  Per Dr Rayann Heman, no changes continue to monitor.

## 2019-09-16 ENCOUNTER — Telehealth: Payer: Self-pay

## 2019-09-16 ENCOUNTER — Telehealth: Payer: Self-pay | Admitting: Physician Assistant

## 2019-09-16 MED ORDER — METOPROLOL SUCCINATE ER 50 MG PO TB24: ORAL_TABLET | ORAL | 3 refills | Status: DC

## 2019-09-16 NOTE — Telephone Encounter (Signed)
Received page from [employer]  Patient had A. fib with VT (21 beats) 4/23 at 1030 central time.  Prior called by nurse reviewed.  We will continue to monitor.

## 2019-09-16 NOTE — Telephone Encounter (Addendum)
Received 2 critical monitor faxed from [employer]-  1- 09/16/19 at 11:07 am showed Atrial Fibrillation RVR Sustained HR 152  2- 09/16/19 at 2:27 pm  Showed Atrial Fibrillation RVR Sustained w Run of V-Tach(8 beats)/PVCs (6 in 1 min)  Tried to call patient, no answer. Left message for patient to call back. Had DOD, Dr. Lovena Le review, NSVT. No changes at this time. Will have scanned into chart to Dr. Tamala Julian can review.  Patient called back. Patient stated she was walking up stairs having SOB and palpitations during these two events. Consulted DOD, Dr. Lovena Le, he advised patient to increase her metoprolol succinate to 50 mg in the AM and 25 mg in the PM. Called patient back to let her know of changes. Will send update to patient's pharmacy of choice.

## 2019-09-18 NOTE — Telephone Encounter (Signed)
Thanks. Agree with increase metoprolol.

## 2019-09-28 ENCOUNTER — Telehealth: Payer: Self-pay | Admitting: Interventional Cardiology

## 2019-09-28 NOTE — Telephone Encounter (Signed)
Arbie Cookey from [employer] was calling with a critical EKG reading for this patient.  Reference #  Not needed

## 2019-09-28 NOTE — Telephone Encounter (Signed)
Spoke with Arbie Cookey from [employer] who is reporting a critical event from patient's monitor.  Fax was also received.  The patient had a 15 second run of V Tach.  [employer] has spoken with the patient who states that she was sitting at home and felt skipped heart beats at the time of the event.  Report reviewed by Dr. Tamala Julian. No changes, continue to monitor.

## 2019-09-28 NOTE — Telephone Encounter (Signed)
Patient's daughter states she received a call about an abnormal rhythm on her mothers monitor. She would like to know what that rhythm was. Please advise.

## 2019-09-28 NOTE — Telephone Encounter (Signed)
Spoke with the patient and her daughter and advised that the monitor report that we received showed atrial fibrillation with a run of V tach which was similar to the previous monitor report that we got. Advised that Dr. Tamala Julian had reviewed the report and did not want to make any changes at this time.

## 2019-09-30 NOTE — Telephone Encounter (Signed)
Patient Name:         DOB:       Height:     Weight:  Office Name:         Referring Provider:  Today's Date:  Date:   STOP BANG RISK ASSESSMENT S (snore) Have you been told that you snore?     YES/NO   T (tired) Are you often tired, fatigued, or sleepy during the day?   YES/NO  O (obstruction) Do you stop breathing, choke, or gasp during sleep? YES/NO   P (pressure) Do you have or are you being treated for high blood pressure? YES/NO   B (BMI) Is your body index greater than 35 kg/m? YES/NO   A (age) Are you 45 years old or older? YES/NO   N (neck) Do you have a neck circumference greater than 16 inches?   YES/NO   G (gender) Are you a female? YES/NO   TOTAL STOP/BANG "YES" ANSWERS                                                                        For Office Use Only              Procedure Order Form    YES to 3+ Stop Bang questions OR two clinical symptoms - patient qualifies for [employer] (CPT 95800)     Submit: This Form + Patient Face Sheet + Clinical Note via [employer] or Fax: [phone]         Clinical Notes: Will consult Sleep Specialist and refer for management of therapy due to patient increased risk of Sleep Apnea. Ordering a sleep study due to the following two clinical symptoms: Excessive daytime sleepiness G47.10 / Gastroesophageal reflux K21.9 / Nocturia R35.1 / Morning Headaches G44.221 / Difficulty concentrating R41.840 / Memory problems or poor judgment G31.84 / Personality changes or irritability R45.4 / Loud snoring R06.83 / Depression F32.9 / Unrefreshed by sleep G47.8 / Impotence N52.9 / History of high blood pressure R03.0 / Insomnia G47.00    I understand that I am proceeding with a home sleep apnea test as ordered by my treating physician. I understand that untreated sleep apnea is a serious cardiovascular risk factor and it is my responsibility to perform the test and seek management for sleep apnea. I will be contacted with the results and be  managed for sleep apnea by a local sleep physician. I will be receiving equipment and further instructions from [facility] I shall promptly ship back the equipment via the included mailing label. I understand my insurance will be billed for the test and as the patient I am responsible for any insurance related out-of-pocket costs incurred. I have been provided with written instructions and can call for additional video or telephonic instruction, with 24-hour availability of qualified personnel to answer any questions: Patient Help Desk [phone].  Patient Signature ______________________________________________________   Date______________________ Patient Telemedicine Verbal Consent

## 2019-09-30 NOTE — Telephone Encounter (Signed)
Per Stop Santa Lighter pt had 4 yes answers.  Called results of assessment to [facility] with [employer]. She hopes to have a determination by Monday May 10.

## 2019-10-05 ENCOUNTER — Telehealth: Payer: Self-pay | Admitting: Interventional Cardiology

## 2019-10-05 NOTE — Telephone Encounter (Signed)
[name] calling with a critical EKG.

## 2019-10-05 NOTE — Telephone Encounter (Signed)
Operator called into triage to have [place] connected to [facility] [place], to report a critical EKG finding on this pt.  The operator was unsuccessful in connecting the [employer] Rep with triage, and no call back number was documented.  Will await for the operator to reconnect to the monitoring company.

## 2019-10-05 NOTE — Telephone Encounter (Signed)
[name] from [place] was reconnected with triage. Per [name], noted today 5/12 at 0949 CST, there was an auto-triggered event on this pts monitor showing AFIB RVR with 18 beats of V-tach.  Per [name], she will fax strips now to our office at [phone].  Will await strips and report to the DOD thereafter.   Did call the pt and endorsed to her the auto-triggered event.  Pt states she was sleeping at the time of this recorded event, and was completely asymptomatic.  Pt states she is taking all her cardiac meds as prescribed, with no skipped doses. Informed the pt that once strips are received, I will show the DOD Dr. Rayann [name], and only follow-up with her thereafter, if further changes need to be made.

## 2019-10-05 NOTE — Telephone Encounter (Signed)
Showed auto-triggered monitor notification to DOD Dr. Rayann Heman.  Per Dr. Rayann Heman, monitor appeared to be SVT (afib) with aberrancy.  Per Dr. Rayann Heman, recording did not show VT.  Dr. Rayann Heman advised for the pt to continue her current medication regimen, and continue to monitor.  Will have monitor scanned into the system, and send this message to Dr. Tamala Julian and his Primary Nurse as an Juluis Rainier.  Pt made aware to continue her current regimen and monitoring.  Pt verbalized understanding and agrees with this plan.

## 2019-10-11 ENCOUNTER — Ambulatory Visit (INDEPENDENT_AMBULATORY_CARE_PROVIDER_SITE_OTHER): Payer: PPO | Admitting: Psychiatry

## 2019-10-11 ENCOUNTER — Other Ambulatory Visit: Payer: Self-pay

## 2019-10-11 ENCOUNTER — Encounter: Payer: Self-pay | Admitting: Psychiatry

## 2019-10-11 DIAGNOSIS — F411 Generalized anxiety disorder: Secondary | ICD-10-CM

## 2019-10-11 DIAGNOSIS — F3181 Bipolar II disorder: Secondary | ICD-10-CM | POA: Diagnosis not present

## 2019-10-11 DIAGNOSIS — F5105 Insomnia due to other mental disorder: Secondary | ICD-10-CM | POA: Diagnosis not present

## 2019-10-11 DIAGNOSIS — F99 Mental disorder, not otherwise specified: Secondary | ICD-10-CM | POA: Diagnosis not present

## 2019-10-11 MED ORDER — VENLAFAXINE HCL ER 75 MG PO CP24: 75.0000 mg | ORAL_CAPSULE | Freq: Every day | ORAL | 1 refills | Status: DC

## 2019-10-11 MED ORDER — OXCARBAZEPINE 150 MG PO TABS: 150.0000 mg | ORAL_TABLET | Freq: Two times a day (BID) | ORAL | 1 refills | Status: DC

## 2019-10-11 MED ORDER — QUETIAPINE FUMARATE 100 MG PO TABS: ORAL_TABLET | ORAL | 1 refills | Status: DC

## 2019-10-11 MED ORDER — QUETIAPINE FUMARATE ER 300 MG PO TB24: 600.0000 mg | ORAL_TABLET | Freq: Every evening | ORAL | 1 refills | Status: DC

## 2019-10-11 MED ORDER — LAMOTRIGINE 100 MG PO TABS: ORAL_TABLET | ORAL | 1 refills | Status: DC

## 2019-10-11 NOTE — Progress Notes (Signed)
KEMYAH PORRAS [id number] 08-23-1950 69 y.o.  Subjective:   Patient ID:  [name] is a 69 y.o. (DOB 02/24/1951) female.  Chief Complaint:  Chief Complaint  Patient presents with  . Follow-up    Mood disturbance, anxiety, and insomnia    HPI [name] presents to the office today for follow-up of mood, anxiety, and insomnia. She reports that she was up [place] for 7 months and was helping care for her mother. Her mother died in 08/02/22. Husband died in 31-Dec-2022. Younger sister is not speaking with her or her other sister now. Has a good relationship with other sister. She reports that her grandchildren are doing well. Currently living with daughter, son-in-law, and grandchildren.   She reports that she has been doing well overall. Denies depressed mood other than grief. Anxiety has been well controlled. Sleeping well. Sleeping at least 7 hours a night. Lost 30 lbs intentionally. Energy and motivation have been good. Has been sewing again. Denies any impulsive or risky behavior. Denies excessive spending. Concentration has been adequate. Denies SI.   She is going to visit her son in 2022-12-31. Daughter does not want her to drive right now due to her dizziness.   Past medication trials: Seroquel Seroquel XR Lamictal Trileptal Effexor XR Lexapro BuSpar-felt tired Trazodone   PHQ2-9     Office Visit from 03/18/2018 in [facility] [place] Visit from 12/05/2015 in [facility]  PHQ-2 Total Score  0  0       Review of Systems:  Review of Systems  Cardiovascular:       Currently wearing a heart monitor and reports that she has been having atrial fibrillation.   Musculoskeletal: Negative for gait problem.  Neurological: Positive for dizziness and light-headedness. Negative for tremors.  Psychiatric/Behavioral:       Please refer to HPI   Had a fall when getting into the bathtub and sustained a  concussion. She was noted to have irregular heart rate   Medications: Reviewed and reconciled  Current Outpatient Medications  Medication Sig Dispense Refill  . acetaminophen (TYLENOL) 500 MG tablet Take 1,000-1,500 mg by mouth daily as needed (for knee pain).     Marland Kitchen amLODipine (NORVASC) 5 MG tablet Take 1 tablet (5 mg total) by mouth daily. 90 tablet 2  . aspirin EC 81 MG tablet Take 81 mg by mouth daily.    Marland Kitchen BIOTIN PO Take 1 tablet by mouth daily with breakfast.    . fenofibrate micronized (ANTARA) 130 MG capsule Take 1 capsule (130 mg total) by mouth daily before breakfast. 30 capsule 3  . furosemide (LASIX) 40 MG tablet Take 1 tablet (40 mg total) by mouth daily. 90 tablet 1  . lamoTRIgine (LAMICTAL) 100 MG tablet Take 100 mg by mouth in the morning and 200 mg by mouth at bedtime 270 tablet 1  . metFORMIN (GLUCOPHAGE-XR) 500 MG 24 hr tablet Take 500 mg by mouth 2 (two) times daily.    . metoprolol succinate (TOPROL-XL) 50 MG 24 hr tablet Take 1 tablet by mouth in AM and take 1/2 tablet by mouth in PM. Take with or immediately following a meal. 135 tablet 3  . Multiple Vitamins-Minerals (CENTRUM SILVER 50+WOMEN PO) Take 1 tablet by mouth daily with breakfast.    . OXcarbazepine (TRILEPTAL) 150 MG tablet Take 1 tablet (150 mg total) by mouth 2 (two) times daily. 180 tablet 1  . pantoprazole (  PROTONIX) 40 MG tablet Take 1 tablet (40 mg total) by mouth daily. 90 tablet 3  . QUEtiapine (SEROQUEL XR) 300 MG 24 hr tablet Take 2 tablets (600 mg total) by mouth every evening. 180 tablet 1  . QUEtiapine (SEROQUEL) 100 MG tablet Take 1/2-1 tab po QHS 90 tablet 1  . rosuvastatin (CRESTOR) 40 MG tablet Take 1 tablet (40 mg total) by mouth daily. 90 tablet 1  . SUMAtriptan (IMITREX) 50 MG tablet TAKE ONE TABLET EVERY 2 HOURS AS NEEDED FOR MIGRAINE. MAY REPEAT IN 2 HOURS IF HEADACHE PERSISTS OR RECURS. (MAX OF 2 TABLETS IN 24 HOURS) 10 tablet 0  . venlafaxine XR (EFFEXOR XR) 75 MG 24 hr capsule Take 1  capsule (75 mg total) by mouth daily with breakfast. 90 capsule 1  . warfarin (COUMADIN) 5 MG tablet Take as directed by Coumadin Clinic. 110 tablet 1  . glucose blood (ONETOUCH VERIO) test strip Check blood sugars once daily Dx: E11.9 100 each 12  . Lancets (ONETOUCH ULTRASOFT) lancets Use as instructed to test blood sugars once daily Dx. E11.65 100 each 12  . ONE TOUCH LANCETS MISC Test once daily to check blood sugar. DX E11.9 100 each 3  . rOPINIRole (REQUIP) 0.25 MG tablet Take 1-2 tablets (0.25-0.5 mg total) by mouth at bedtime. (Patient not taking: Reported on 10/11/2019) 180 tablet 0  . traMADol (ULTRAM) 50 MG tablet Take 50 mg by mouth every 6 (six) hours as needed (for pain).      No current facility-administered medications for this visit.    Medication Side Effects: None  Allergies:  Allergies  Allergen Reactions  . Mucinex [Guaifenesin Er] Other (See Comments)    Severe headaches   . Keflex [Cephalexin] Other (See Comments)    Headaches and dizziness  . Sulfonamide Derivatives Other (See Comments)    Headaches     Past Medical History:  Diagnosis Date  . ANEMIA 05/28/2010  . AORTIC VALVE REPLACEMENT, HX OF 03/11/2010   Mechanical prosthesis  . BIPOLAR AFFECTIVE DISORDER 03/11/2010  . Depression   . Diverticulitis 12/18/2010  . FIBROIDS, UTERUS 03/11/2010  . Gout 09/04/2016  . Grave's disease 8-12  . Hyperlipidemia associated with type 2 diabetes mellitus ([place]) 06/05/2016  . Hypertension   . Low back pain 11/02/2017  . Migraine 06/05/2016  . Mixed hyperlipidemia 03/11/2010  . Obesity   . Skin cancer   . Syncope 08/22/2019  . UTI (lower urinary tract infection) 08/14/2011    Family History  Problem Relation Age of Onset  . Scoliosis Mother   . Arthritis Mother        Rheumatoid  . Aneurysm Mother        heart  . Alcohol abuse Father   . Depression Sister   . Depression Brother   . Alcohol abuse Brother   . Cancer Maternal Grandmother        colon  .  Diabetes Maternal Grandfather   . Heart disease Maternal Grandfather   . Alcohol abuse Maternal Grandfather   . Depression Paternal Grandmother   . Diabetes Paternal Grandmother   . Depression Paternal Grandfather   . Diabetes Paternal Grandfather   . Depression Sister   . Alcohol abuse Brother   . Depression Brother   . Heart disease Brother     Social History   Socioeconomic History  . Marital status: Married    Spouse name: Not on file  . Number of children: Not on file  . Years of education:  Not on file  . Highest education level: Not on file  Occupational History  . Not on file  Tobacco Use  . Smoking status: Former Smoker    Quit date: 05/26/1990    Years since quitting: 29.3  . Smokeless tobacco: Never Used  Substance and Sexual Activity  . Alcohol use: Yes    Alcohol/week: 0.0 standard drinks    Comment: rare  . Drug use: No  . Sexual activity: Not Currently  Other Topics Concern  . Not on file  Social History Narrative  . Not on file   Social Determinants of Health   Financial Resource Strain:   . Difficulty of Paying Living Expenses:   Food Insecurity:   . Worried About Charity fundraiser in the Last Year:   . Arboriculturist in the Last Year:   Transportation Needs:   . Film/video editor (Medical):   Marland Kitchen Lack of Transportation (Non-Medical):   Physical Activity:   . Days of Exercise per Week:   . Minutes of Exercise per Session:   Stress:   . Feeling of Stress :   Social Connections:   . Frequency of Communication with Friends and Family:   . Frequency of Social Gatherings with Friends and Family:   . Attends Religious Services:   . Active Member of Clubs or Organizations:   . Attends Archivist Meetings:   Marland Kitchen Marital Status:   Intimate Partner Violence:   . Fear of Current or Ex-Partner:   . Emotionally Abused:   Marland Kitchen Physically Abused:   . Sexually Abused:     Past Medical History, Surgical history, Social history, and Family  history were reviewed and updated as appropriate.   Please see review of systems for further details on the patient's review from today.   Objective:   Physical Exam:  BP 123/69   Pulse 64   Physical Exam Constitutional:      General: She is not in acute distress. Musculoskeletal:        General: No deformity.  Neurological:     Mental Status: She is alert and oriented to person, place, and time.     Coordination: Coordination normal.  Psychiatric:        Attention and Perception: Attention and perception normal. She does not perceive auditory or visual hallucinations.        Mood and Affect: Mood normal. Mood is not anxious or depressed. Affect is not labile, blunt, angry or inappropriate.        Speech: Speech normal.        Behavior: Behavior normal.        Thought Content: Thought content normal. Thought content is not paranoid or delusional. Thought content does not include homicidal or suicidal ideation. Thought content does not include homicidal or suicidal plan.        Cognition and Memory: Cognition and memory normal.        Judgment: Judgment normal.     Comments: Insight intact     Lab Review:     Component Value Date/Time   NA 140 08/23/2019 0311   K 3.6 08/23/2019 0311   CL 100 08/23/2019 0311   CO2 28 08/23/2019 0311   GLUCOSE 113 (H) 08/23/2019 0311   BUN 13 08/23/2019 0311   CREATININE 0.97 08/23/2019 0311   CREATININE 0.89 06/20/2015 1049   CALCIUM 9.0 08/23/2019 0311   PROT 7.5 08/21/2019 1236   ALBUMIN 4.2 08/21/2019 1236   AST 25  08/21/2019 1236   ALT 24 08/21/2019 1236   ALKPHOS 55 08/21/2019 1236   BILITOT 1.0 08/21/2019 1236   GFRNONAA >60 08/23/2019 0311   GFRAA >60 08/23/2019 0311       Component Value Date/Time   WBC 10.8 (H) 08/22/2019 0435   RBC 4.20 08/22/2019 0435   HGB 12.2 08/22/2019 0435   HGB 12.0 06/16/2011 0953   HCT 39.2 08/22/2019 0435   HCT 35.6 06/16/2011 0953   PLT 246 08/22/2019 0435   PLT 233 06/16/2011 0953    MCV 93.3 08/22/2019 0435   MCV 86.6 06/16/2011 0953   MCH 29.0 08/22/2019 0435   MCHC 31.1 08/22/2019 0435   RDW 14.7 08/22/2019 0435   RDW 14.1 06/16/2011 0953   LYMPHSABS 1.2 08/21/2019 1236   LYMPHSABS 1.7 06/16/2011 0953   MONOABS 0.5 08/21/2019 1236   MONOABS 0.3 06/16/2011 0953   EOSABS 0.2 08/21/2019 1236   EOSABS 0.2 06/16/2011 0953   BASOSABS 0.1 08/21/2019 1236   BASOSABS 0.1 06/16/2011 0953    No results found for: POCLITH, LITHIUM   No results found for: PHENYTOIN, PHENOBARB, VALPROATE, CBMZ   .res Assessment: Plan:   Discussed that there was some concern about QT prolongation when she was hospitalized. QT not prolonged when she was seen by cardiology. Discussed that both Seroquel and Effexor XR can cause QT prolongation and may need to consider dose reductions if she has further QT prolongation or if cardiologist recommends dose reduction.  Continue Seroquel XR 600 mg po q evening for mood stabilization. Continue Seroquel 75 mg po QHS for insomnia.  Continue Lamictal 100 mg po q am and 200 mg po QHS for mood stabilization.  Continue Effexor XR 75 mg po qd for mood and anxiety.  Continue Trileptal 150 mg po BID for mood stabilization and anxiety.  Pt to f/u in 6 months or sooner if clinically indicated.  Patient advised to contact office with any questions, adverse effects, or acute worsening in signs and symptoms.  [name] was seen today for follow-up.  Diagnoses and all orders for this visit:  Bipolar II disorder ([place]) -     QUEtiapine (SEROQUEL XR) 300 MG 24 hr tablet; Take 2 tablets (600 mg total) by mouth every evening. -     QUEtiapine (SEROQUEL) 100 MG tablet; Take 1/2-1 tab po QHS -     OXcarbazepine (TRILEPTAL) 150 MG tablet; Take 1 tablet (150 mg total) by mouth 2 (two) times daily. -     lamoTRIgine (LAMICTAL) 100 MG tablet; Take 100 mg by mouth in the morning and 200 mg by mouth at bedtime -     venlafaxine XR (EFFEXOR XR) 75 MG 24 hr capsule; Take 1  capsule (75 mg total) by mouth daily with breakfast.  Insomnia due to other mental disorder -     QUEtiapine (SEROQUEL) 100 MG tablet; Take 1/2-1 tab po QHS  Generalized anxiety disorder -     OXcarbazepine (TRILEPTAL) 150 MG tablet; Take 1 tablet (150 mg total) by mouth 2 (two) times daily. -     venlafaxine XR (EFFEXOR XR) 75 MG 24 hr capsule; Take 1 capsule (75 mg total) by mouth daily with breakfast.     Please see After Visit Summary for patient specific instructions.  Future Appointments  Date Time Provider [place]  10/17/2019  4:00 PM [facility]  10/17/2019  4:20 PM Belva Crome, MD [facility]  04/12/2020  1:30 PM Thayer Headings, PMHNP [facility] None  No orders of the defined types were placed in this encounter.   -------------------------------

## 2019-10-13 ENCOUNTER — Other Ambulatory Visit: Payer: Self-pay

## 2019-10-13 MED ORDER — FUROSEMIDE 40 MG PO TABS: 40.0000 mg | ORAL_TABLET | Freq: Every day | ORAL | 3 refills | Status: DC

## 2019-10-17 ENCOUNTER — Encounter: Payer: Self-pay | Admitting: Interventional Cardiology

## 2019-10-17 ENCOUNTER — Ambulatory Visit: Payer: PPO | Admitting: Interventional Cardiology

## 2019-10-17 ENCOUNTER — Ambulatory Visit (INDEPENDENT_AMBULATORY_CARE_PROVIDER_SITE_OTHER): Payer: PPO | Admitting: *Deleted

## 2019-10-17 ENCOUNTER — Other Ambulatory Visit: Payer: Self-pay

## 2019-10-17 VITALS — BP 138/78 | HR 75 | Ht 69.0 in | Wt 217.0 lb

## 2019-10-17 DIAGNOSIS — Z5181 Encounter for therapeutic drug level monitoring: Secondary | ICD-10-CM

## 2019-10-17 DIAGNOSIS — Z7901 Long term (current) use of anticoagulants: Secondary | ICD-10-CM

## 2019-10-17 DIAGNOSIS — Z952 Presence of prosthetic heart valve: Secondary | ICD-10-CM

## 2019-10-17 DIAGNOSIS — I4891 Unspecified atrial fibrillation: Secondary | ICD-10-CM

## 2019-10-17 DIAGNOSIS — I10 Essential (primary) hypertension: Secondary | ICD-10-CM

## 2019-10-17 DIAGNOSIS — Z7189 Other specified counseling: Secondary | ICD-10-CM

## 2019-10-17 DIAGNOSIS — E1169 Type 2 diabetes mellitus with other specified complication: Secondary | ICD-10-CM

## 2019-10-17 DIAGNOSIS — I5042 Chronic combined systolic (congestive) and diastolic (congestive) heart failure: Secondary | ICD-10-CM | POA: Diagnosis not present

## 2019-10-17 DIAGNOSIS — E785 Hyperlipidemia, unspecified: Secondary | ICD-10-CM | POA: Diagnosis not present

## 2019-10-17 LAB — POCT INR: INR: 1.9 — AB (ref 2.0–3.0)

## 2019-10-17 NOTE — Patient Instructions (Signed)
Description   Take 2 tablets today and 1.5 tablets tomorrow, then continue on same dosage 1 tablet daily except 1.5 tablets on Mondays and Fridays. Recheck INR in 2 weeks. [facility] [phone]

## 2019-10-17 NOTE — Progress Notes (Signed)
Cardiology Office Note:    Date:  10/17/2019   ID:  [name], DOB Nov 10, 1950, MRN [id number]  PCP:  Mosie Lukes, MD  Cardiologist:  Sinclair Grooms, MD   Referring MD: Mosie Lukes, MD   Chief Complaint  Patient presents with  . Atrial Fibrillation    History of Present Illness:    [name] is a 69 y.o. female with a hx of post aortic valve replacement with a mechanical prosthesis 2011, history of recurrent atrial fibrillation, hyperlipidemia, diabetes mellitus type 2, and chronic anticoagulation therapy with Coumadin.Chronic diastolic HF.  She is accompanied by her daughter.  She fell coming out of her psychiatrist office on 03/12/2020 without associated injury or head trauma.  She went for the second part of her Covid vaccination on 03/11/2020 and had significant shortness of breath.  She was wearing the monitor on both occasions and they want to determine if there were a particular rhythm issues at the time of these events occurring around 1230 to 2 PM and 1 PM on  Past Medical History:  Diagnosis Date  . ANEMIA 05/28/2010  . AORTIC VALVE REPLACEMENT, HX OF 03/11/2010   Mechanical prosthesis  . BIPOLAR AFFECTIVE DISORDER 03/11/2010  . Depression   . Diverticulitis 12/18/2010  . FIBROIDS, UTERUS 03/11/2010  . Gout 09/04/2016  . Grave's disease 8-12  . Hyperlipidemia associated with type 2 diabetes mellitus ([place]) 06/05/2016  . Hypertension   . Low back pain 11/02/2017  . Migraine 06/05/2016  . Mixed hyperlipidemia 03/11/2010  . Obesity   . Skin cancer   . Syncope 08/22/2019  . UTI (lower urinary tract infection) 08/14/2011    Past Surgical History:  Procedure Laterality Date  . ABDOMINAL HYSTERECTOMY  ?1998   sb/l spo, total for fibroids/heavy bleeding  . ANKLE SURGERY Left 01/2018   hardware removal  . AORTIC VALVE REPLACEMENT  2011   Mechanical prosthesis, [employer]  . APPENDECTOMY     Required revision for EColi infection, required recurrent packing    . bowel obstruction     Requiring adhesions to be removed  . CHOLECYSTECTOMY    . COLONOSCOPY WITH PROPOFOL N/A 11/01/2015   Procedure: COLONOSCOPY WITH PROPOFOL;  Surgeon: Milus Banister, MD;  Location: [facility];  Service: Endoscopy;  Laterality: N/A;  . CYSTOCELE REPAIR N/A 10/16/2015   Procedure: ANTERIOR REPAIR (CYSTOCELE);  Surgeon: Donnamae Jude, MD;  Location: [facility];  Service: Gynecology;  Laterality: N/A;  . HERNIA REPAIR  08-16-10  . LEFT HEART CATHETERIZATION WITH CORONARY ANGIOGRAM N/A 12/05/2011   Procedure: LEFT HEART CATHETERIZATION WITH CORONARY ANGIOGRAM;  Surgeon: Sinclair Grooms, MD;  Location: [facility] CATH LAB;  Service: Cardiovascular;  Laterality: N/A;  . TOTAL KNEE ARTHROPLASTY Right 02/24/2013   Procedure: RIGHT TOTAL KNEE ARTHROPLASTY;  Surgeon: Johnn Hai, MD;  Location: [facility];  Service: Orthopedics;  Laterality: Right;  . TUBAL LIGATION    . varicose vein surgery b/l legs      Current Medications: Current Meds  Medication Sig  . acetaminophen (TYLENOL) 500 MG tablet Take 1,000-1,500 mg by mouth daily as needed (for knee pain).   Marland Kitchen amLODipine (NORVASC) 5 MG tablet Take 1 tablet (5 mg total) by mouth daily.  Marland Kitchen aspirin EC 81 MG tablet Take 81 mg by mouth daily.  Marland Kitchen BIOTIN PO Take 1 tablet by mouth daily with breakfast.  . fenofibrate micronized (ANTARA) 130 MG capsule Take 1 capsule (130 mg total) by mouth daily  before breakfast.  . furosemide (LASIX) 40 MG tablet Take 1 tablet (40 mg total) by mouth daily.  Marland Kitchen glucose blood (ONETOUCH VERIO) test strip Check blood sugars once daily Dx: E11.9  . lamoTRIgine (LAMICTAL) 100 MG tablet Take 100 mg by mouth in the morning and 200 mg by mouth at bedtime  . Lancets (ONETOUCH ULTRASOFT) lancets Use as instructed to test blood sugars once daily Dx. E11.65  . metFORMIN (GLUCOPHAGE-XR) 500 MG 24 hr tablet Take 500 mg by mouth 2 (two) times daily.  . metoprolol succinate (TOPROL-XL) 50 MG 24 hr tablet Take 1 tablet by mouth  in AM and take 1/2 tablet by mouth in PM. Take with or immediately following a meal.  . Multiple Vitamins-Minerals (CENTRUM SILVER 50+WOMEN PO) Take 1 tablet by mouth daily with breakfast.  . ONE TOUCH LANCETS MISC Test once daily to check blood sugar. DX E11.9  . OXcarbazepine (TRILEPTAL) 150 MG tablet Take 1 tablet (150 mg total) by mouth 2 (two) times daily.  . pantoprazole (PROTONIX) 40 MG tablet Take 1 tablet (40 mg total) by mouth daily.  . QUEtiapine (SEROQUEL XR) 300 MG 24 hr tablet Take 2 tablets (600 mg total) by mouth every evening.  Marland Kitchen QUEtiapine (SEROQUEL) 100 MG tablet Take 1/2-1 tab po QHS  . rOPINIRole (REQUIP) 0.25 MG tablet Take 1-2 tablets (0.25-0.5 mg total) by mouth at bedtime.  . rosuvastatin (CRESTOR) 40 MG tablet Take 1 tablet (40 mg total) by mouth daily.  . SUMAtriptan (IMITREX) 50 MG tablet TAKE ONE TABLET EVERY 2 HOURS AS NEEDED FOR MIGRAINE. MAY REPEAT IN 2 HOURS IF HEADACHE PERSISTS OR RECURS. (MAX OF 2 TABLETS IN 24 HOURS)  . traMADol (ULTRAM) 50 MG tablet Take 50 mg by mouth every 6 (six) hours as needed (for pain).   Marland Kitchen venlafaxine XR (EFFEXOR XR) 75 MG 24 hr capsule Take 1 capsule (75 mg total) by mouth daily with breakfast.  . warfarin (COUMADIN) 5 MG tablet Take as directed by Coumadin Clinic.     Allergies:   Mucinex [guaifenesin er], Keflex [cephalexin], and Sulfonamide derivatives   Social History   Socioeconomic History  . Marital status: Married    Spouse name: Not on file  . Number of children: Not on file  . Years of education: Not on file  . Highest education level: Not on file  Occupational History  . Not on file  Tobacco Use  . Smoking status: Former Smoker    Quit date: 05/26/1990    Years since quitting: 29.4  . Smokeless tobacco: Never Used  Substance and Sexual Activity  . Alcohol use: Yes    Alcohol/week: 0.0 standard drinks    Comment: rare  . Drug use: No  . Sexual activity: Not Currently  Other Topics Concern  . Not on file    Social History Narrative  . Not on file   Social Determinants of Health   Financial Resource Strain:   . Difficulty of Paying Living Expenses:   Food Insecurity:   . Worried About Charity fundraiser in the Last Year:   . Arboriculturist in the Last Year:   Transportation Needs:   . Film/video editor (Medical):   Marland Kitchen Lack of Transportation (Non-Medical):   Physical Activity:   . Days of Exercise per Week:   . Minutes of Exercise per Session:   Stress:   . Feeling of Stress :   Social Connections:   . Frequency of Communication with Friends  and Family:   . Frequency of Social Gatherings with Friends and Family:   . Attends Religious Services:   . Active Member of Clubs or Organizations:   . Attends Archivist Meetings:   Marland Kitchen Marital Status:      Family History: The patient's family history includes Alcohol abuse in her brother, brother, father, and maternal grandfather; Aneurysm in her mother; Arthritis in her mother; Cancer in her maternal grandmother; Depression in her brother, brother, paternal grandfather, paternal grandmother, sister, and sister; Diabetes in her maternal grandfather, paternal grandfather, and paternal grandmother; Heart disease in her brother and maternal grandfather; Scoliosis in her mother.  ROS:   Please see the history of present illness.    She feels her heart beating hard frequently.  All other systems reviewed and are negative.  EKGs/Labs/Other Studies Reviewed:    The following studies were reviewed today: No new data:  30-day monitor is pending.: Atrial fib with irregular wide-complex tachycardia likely representing a Baron C.  Full report will follow.  There were 6 alarm events reported by [employer]   2D Doppler echocardiogram 08/23/2019: IMPRESSIONS    1. Left ventricular ejection fraction, by estimation, is 45 to 50%. The  left ventricle has mildly decreased function. The left ventricle  demonstrates regional wall  motion abnormalities (see scoring  diagram/findings for description). There is mild left  ventricular hypertrophy. Left ventricular diastolic parameters are  indeterminate. There is moderate hypokinesis of the left ventricular,  entire septal wall, inferoseptal wall and inferior wall.  2. Right ventricular systolic function is moderately reduced. The right  ventricular size is normal.  3. The mitral valve is abnormal. Trivial mitral valve regurgitation.  4. The aortic valve has been repaired/replaced. Aortic valve  regurgitation is trivial. There is a 25 mm St. Jude mechanical valve  present in the aortic position. Echo findings are consistent with normal  structure and function of the aortic valve  prosthesis.  5. 28 mm hemashield graft. Aortic root/ascending aorta has been  repaired/replaced.  6. The inferior vena cava is dilated in size with >50% respiratory  variability, suggesting right atrial pressure of 8 mmHg.   EKG:  EKG not repeated  Recent Labs: 08/21/2019: ALT 24; Magnesium 1.7 08/22/2019: Hemoglobin 12.2; Platelets 246 08/23/2019: BUN 13; Creatinine, Ser 0.97; Potassium 3.6; Sodium 140  Recent Lipid Panel    Component Value Date/Time   CHOL 257 (H) 08/23/2019 0312   TRIG 434 (H) 08/23/2019 0312   HDL 40 (L) 08/23/2019 0312   CHOLHDL 6.4 08/23/2019 0312   VLDL UNABLE TO CALCULATE IF TRIGLYCERIDE OVER 400 mg/dL 08/23/2019 0312   LDLCALC UNABLE TO CALCULATE IF TRIGLYCERIDE OVER 400 mg/dL 08/23/2019 0312   LDLDIRECT 131.8 (H) 08/23/2019 0312    Physical Exam:    VS:  BP 138/78   Pulse 75   Ht 5\' 9"  (1.753 m)   Wt 217 lb (98.4 kg)   SpO2 97%   BMI 32.05 kg/m     Wt Readings from Last 3 Encounters:  10/17/19 217 lb (98.4 kg)  08/31/19 221 lb (100.2 kg)  08/21/19 225 lb (102.1 kg)     GEN: Appears compensated, slightly obese.. No acute distress HEENT: Normal NECK: No JVD. LYMPHATICS: No lymphadenopathy CARDIAC: Mechanical valve closure sounds.  No  diastolic murmur.  Irregularly irregular RR without murmur, gallop, or edema. VASCULAR:  Normal Pulses. No bruits. RESPIRATORY:  Clear to auscultation without rales, wheezing or rhonchi  ABDOMEN: Soft, non-tender, non-distended, No pulsatile mass, MUSCULOSKELETAL: No  deformity  SKIN: Warm and dry NEUROLOGIC:  Alert and oriented x 3 PSYCHIATRIC:  Normal affect   ASSESSMENT:    1. Atrial fibrillation, unspecified type ([place])   2. Hx of mechanical aortic valve replacement   3. Long term current use of anticoagulant therapy   4. Chronic combined systolic and diastolic heart failure ([place])   5. Essential hypertension, benign   6. Hyperlipidemia associated with type 2 diabetes mellitus ([place])   7. Educated about COVID-19 virus infection    PLAN:    In order of problems listed above:  1. Suspect she is in atrial fibrillation continuously.  The monitor will help [place] to decide. 2. Normal mechanical valve function when last evaluated by echo 3. INR is slightly low today at 1.9.  [place] [address].  Adjustments have been made. 4. She is on Toprol-XL (75 mg/day in a divided dose) and no ARB/ARB.  We will see what the monitor shows an make adjustments. 5. Blood pressure control is excellent with target being 140/80 mmHg. 6. LDL less than 70 is the target.  Continue Crestor 40 mg/day. 7. She has completed Covid vaccination.  She has better now than during the recent hospitalization.  She is still unsteady on her feet.  The continuous monitor will be evaluated.  Results will be discussed with the family.   Medication Adjustments/Labs and Tests Ordered: Current medicines are reviewed at length with the patient today.  Concerns regarding medicines are outlined above.  No orders of the defined types were placed in this encounter.  No orders of the defined types were placed in this encounter.   Patient Instructions  Medication Instructions:  Your physician recommends that you continue on your  current medications as directed. Please refer to the Current Medication list given to you today.  *If you need a refill on your cardiac medications before your next appointment, please call your pharmacy*   Lab Work: None If you have labs (blood work) drawn today and your tests are completely normal, you will receive your results only by: Marland Kitchen MyChart Message (if you have MyChart) OR . A paper copy in the mail If you have any lab test that is abnormal or we need to change your treatment, we will call you to review the results.   Testing/Procedures: None   Follow-Up: At [facility], you and your health needs are our priority.  As part of our continuing mission to provide you with exceptional heart care, we have created designated Provider Care Teams.  These Care Teams include your primary Cardiologist (physician) and Advanced Practice Providers (APPs -  Physician Assistants and Nurse Practitioners) who all work together to provide you with the care you need, when you need it.  We recommend signing up for the patient portal called "MyChart".  Sign up information is provided on this After Visit Summary.  MyChart is used to connect with patients for Virtual Visits (Telemedicine).  Patients are able to view lab/test results, encounter notes, upcoming appointments, etc.  Non-urgent messages can be sent to your provider as well.   To learn more about what you can do with MyChart, go to [email or web address].    Your next appointment:   4 month(s)  The format for your next appointment:   In Person  Provider:   You may see Sinclair Grooms, MD or one of the following Advanced Practice Providers on your designated Care Team:    Truitt Merle, NP  Cecilie Kicks, NP  Sharee Pimple  Glenford Peers, NP    Other Instructions      Signed, Sinclair Grooms, MD  10/17/2019 5:03 PM    [facility]

## 2019-10-17 NOTE — Patient Instructions (Signed)
Medication Instructions:  Your physician recommends that you continue on your current medications as directed. Please refer to the Current Medication list given to you today.  *If you need a refill on your cardiac medications before your next appointment, please call your pharmacy*   Lab Work: None If you have labs (blood work) drawn today and your tests are completely normal, you will receive your results only by: . MyChart Message (if you have MyChart) OR . A paper copy in the mail If you have any lab test that is abnormal or we need to change your treatment, we will call you to review the results.   Testing/Procedures: None   Follow-Up: At [facility], you and your health needs are our priority.  As part of our continuing mission to provide you with exceptional heart care, we have created designated Provider Care Teams.  These Care Teams include your primary Cardiologist (physician) and Advanced Practice Providers (APPs -  Physician Assistants and Nurse Practitioners) who all work together to provide you with the care you need, when you need it.  We recommend signing up for the patient portal called "MyChart".  Sign up information is provided on this After Visit Summary.  MyChart is used to connect with patients for Virtual Visits (Telemedicine).  Patients are able to view lab/test results, encounter notes, upcoming appointments, etc.  Non-urgent messages can be sent to your provider as well.   To learn more about what you can do with MyChart, go to [email or web address].    Your next appointment:   4 month(s)  The format for your next appointment:   In Person  Provider:   You may see Henry W Smith III, MD or one of the following Advanced Practice Providers on your designated Care Team:    Lori Gerhardt, NP  Laura Ingold, NP  Jill McDaniel, NP    Other Instructions   

## 2019-10-27 ENCOUNTER — Telehealth: Payer: Self-pay | Admitting: *Deleted

## 2019-10-27 DIAGNOSIS — I4891 Unspecified atrial fibrillation: Secondary | ICD-10-CM

## 2019-10-27 NOTE — Telephone Encounter (Signed)
Spoke with pt and made her aware of results and recommendations.  Pt agreeable to plan to see EP.  Pt is leaving Monday, 6/7 to go out of town and will not return until late July/early August.

## 2019-10-27 NOTE — Telephone Encounter (Signed)
-----   Message from Belva Crome, MD sent at 10/26/2019  5:03 PM EDT ----- Let the patient know she is in continuous atrial fibrillation/flutter.  Frequent wide-complex arrhythmia was noted lasting up to 15 beats.  I recommend an electrophysiology consult for further guidance.  Anderson Malta will set this up. A copy will be sent to Mosie Lukes, MD

## 2019-10-31 ENCOUNTER — Telehealth: Payer: Self-pay

## 2019-10-31 NOTE — Telephone Encounter (Signed)
Pt called into clinic to cancel her appt for today 10/31/19.  Pt states she is out of town in [place] at her daughter Amy's house and she will be there all through June and July.  Pt asked if we could refer her to a Coumadin Clinic near [place], [name].  Advised pt we did not know of any Coumadin Clinics in [facility], but she could find a free standing lab such as [facility] or [facility] or a hospital lab.  Call the lab and get a fax number and we could fax over an order to them to have her PT/INR drawn and results faxed to [place].  Then we could call her and dose her Warfarin once we received those results.  Pt verbalized understanding, she also put her daughter Amy on the phone so I could repeat those instructions to her as well.  Daughter states they will call us back in the next couple of days with a fax number once they locate a free standing lab to go to. Will await call back.

## 2019-11-01 NOTE — Telephone Encounter (Signed)
Pt called back. She has an appointment at [facility], [place], [place] [address] on Monday 6/14 to have her INR checked.  I have faxed over a standing order to them for INR (fax # [phone]). I also emailed patient the order.

## 2019-11-07 ENCOUNTER — Telehealth: Payer: Self-pay | Admitting: Interventional Cardiology

## 2019-11-07 DIAGNOSIS — I4891 Unspecified atrial fibrillation: Secondary | ICD-10-CM | POA: Diagnosis not present

## 2019-11-07 DIAGNOSIS — Z5181 Encounter for therapeutic drug level monitoring: Secondary | ICD-10-CM | POA: Diagnosis not present

## 2019-11-07 LAB — PROTIME-INR: INR: 1.9 — AB (ref 0.9–1.1)

## 2019-11-07 NOTE — Telephone Encounter (Signed)
Patient would like to know if Dr. Tamala Julian would give her a script for an UTI.

## 2019-11-07 NOTE — Telephone Encounter (Signed)
Agree with instructions

## 2019-11-07 NOTE — Telephone Encounter (Signed)
Daughter of the patient called back. The pt is in [place] and would need the rx sent to a local pharmacy. Please call the daughter to get the pt's local pharmacy

## 2019-11-07 NOTE — Telephone Encounter (Signed)
Pts daughter is calling to ask if Dr. Tamala Julian would consider faxing in a lab order for the pt to be tested for UTI, to a lab in [facility], where the pt will be staying for the summer.   Daughter states that the pt has fallen 4 times over the last week or so, and she is talking "out of her head," and they think she may have a UTI.  Daughter voiced no other complaints, like cardiac or any other urinary complaints from the pt.  Daughter states she is not with the pt in [facility], she is with her Brother visiting for awhile. Daughter states her Brother called to inform her of what happened to the pt, this morning.  Informed the pts Daughter [name] (on DPR), that unfortunately, Dr. Tamala Julian and his RN are both out of the office this week.  Advised [name], that being the pt has fallen x 4, and her mentation has changed, the safest thing for her Brother to do, is to take her to a local Urgent Care in [facility], to have a Provider there, thoroughly assess the pt head-to-toe, and order a UA and Culture and Sensitivity if warranted.  Informed [name], that would be the safest decision to make for the pts care at this time, so that any acute issues can be ruled out and/or addressed.  Advised [name] to call her Brother and have him take her to an Urgent care now, so that there is no delay in care, and she can receive treatment as soon as possible.  [name] verbalized understanding and agrees with this plan.  She agrees that this would be in the best interest of her Mom. [name] was gracious for all the assistance and prompt follow-up.  Will send this message to Dr. Tamala Julian and his RN as a general FYI, so they can review upon return to the office.

## 2019-11-08 ENCOUNTER — Ambulatory Visit (INDEPENDENT_AMBULATORY_CARE_PROVIDER_SITE_OTHER): Payer: PPO | Admitting: Cardiology

## 2019-11-08 DIAGNOSIS — Z5181 Encounter for therapeutic drug level monitoring: Secondary | ICD-10-CM

## 2019-11-08 DIAGNOSIS — Z952 Presence of prosthetic heart valve: Secondary | ICD-10-CM | POA: Diagnosis not present

## 2019-11-08 DIAGNOSIS — I4891 Unspecified atrial fibrillation: Secondary | ICD-10-CM | POA: Diagnosis not present

## 2019-11-08 NOTE — Progress Notes (Signed)
Received lab result result from [facility] lab, fax was a little blurry, called [facility] and confirmed that INR was 1.9, [facility] stated that they would refax the result.

## 2019-11-08 NOTE — Patient Instructions (Addendum)
Description   Spoke to pt and instructed for her to take 1.5 tablets of warfarin today (6/15) and 2 tablets tomorrow (6/16) and then start taking 1 tablet daily except for 1.5 tablets on Monday, Wednesday and Fridays. Be consistent with green leafy intake. Recheck INR in 2 weeks. Coumadin Clinic [phone]

## 2019-11-17 ENCOUNTER — Telehealth: Payer: Self-pay | Admitting: *Deleted

## 2019-11-17 NOTE — Telephone Encounter (Signed)
Per [facility] split night sleep study- 77412-  11/07/19 -02/05/20 Sleep study-[id number]                                    11/07/19- 02-05-20 Notice of Approval of requested services Authorization # [id number] Ok to schedule

## 2019-11-17 NOTE — Telephone Encounter (Signed)
Patient is scheduled for lab study on 12/05/19. pt is scheduled for COVID screening on 12/03/19 prior to ss.  Patient understands her sleep study will be done at [facility] sleep lab. Patient understands she will receive a sleep packet in a week or so. Patient understands to call if she does not receive the sleep packet in a timely manner. Patient agrees with treatment and thanked me for call.

## 2019-11-17 NOTE — Telephone Encounter (Signed)
-----   Message from Alta Corning sent at 09/23/2019  2:58 PM EDT ----- 09/23/19: PENDING: Authorization requested faxed to [facility] at [phone].Marland KitchenMarland KitchenCAS ----- Message ----- From: Loren Racer, RN Sent: 08/31/2019   2:15 PM EDT To: Freada Bergeron, CMA, [facility] Sleep Studies  Sleep study ordered

## 2019-11-21 DIAGNOSIS — I4891 Unspecified atrial fibrillation: Secondary | ICD-10-CM | POA: Diagnosis not present

## 2019-11-21 DIAGNOSIS — Z5181 Encounter for therapeutic drug level monitoring: Secondary | ICD-10-CM | POA: Diagnosis not present

## 2019-11-21 LAB — PROTIME-INR: INR: 1.6 — AB (ref 0.9–1.1)

## 2019-11-22 ENCOUNTER — Ambulatory Visit (INDEPENDENT_AMBULATORY_CARE_PROVIDER_SITE_OTHER): Payer: PPO | Admitting: *Deleted

## 2019-11-22 DIAGNOSIS — I4891 Unspecified atrial fibrillation: Secondary | ICD-10-CM | POA: Diagnosis not present

## 2019-11-22 DIAGNOSIS — Z952 Presence of prosthetic heart valve: Secondary | ICD-10-CM

## 2019-11-22 DIAGNOSIS — Z5181 Encounter for therapeutic drug level monitoring: Secondary | ICD-10-CM

## 2019-12-03 ENCOUNTER — Other Ambulatory Visit (HOSPITAL_COMMUNITY): Payer: PPO

## 2019-12-05 ENCOUNTER — Encounter (HOSPITAL_BASED_OUTPATIENT_CLINIC_OR_DEPARTMENT_OTHER): Payer: PPO | Admitting: Cardiology

## 2019-12-05 DIAGNOSIS — I4891 Unspecified atrial fibrillation: Secondary | ICD-10-CM | POA: Diagnosis not present

## 2019-12-05 DIAGNOSIS — Z5181 Encounter for therapeutic drug level monitoring: Secondary | ICD-10-CM | POA: Diagnosis not present

## 2019-12-05 DIAGNOSIS — R300 Dysuria: Secondary | ICD-10-CM | POA: Diagnosis not present

## 2019-12-05 LAB — PROTIME-INR: INR: 2.3 — AB (ref 0.9–1.1)

## 2019-12-06 ENCOUNTER — Ambulatory Visit (INDEPENDENT_AMBULATORY_CARE_PROVIDER_SITE_OTHER): Payer: PPO | Admitting: *Deleted

## 2019-12-06 DIAGNOSIS — I4891 Unspecified atrial fibrillation: Secondary | ICD-10-CM

## 2019-12-06 DIAGNOSIS — Z952 Presence of prosthetic heart valve: Secondary | ICD-10-CM

## 2019-12-06 DIAGNOSIS — Z5181 Encounter for therapeutic drug level monitoring: Secondary | ICD-10-CM | POA: Diagnosis not present

## 2019-12-06 NOTE — Patient Instructions (Signed)
Description   Spoke to pt and instructed for her to take 1.5 tablets of warfarin today then start taking 1.5 tablets daily except for 1 tablet on Tuesday and Saturday. Continue to be consistent with green leafy intake. Recheck INR in 2 weeks (at [facility] lab). Coumadin Clinic [phone]

## 2019-12-20 ENCOUNTER — Other Ambulatory Visit: Payer: Self-pay

## 2019-12-21 ENCOUNTER — Telehealth: Payer: Self-pay | Admitting: *Deleted

## 2019-12-21 NOTE — Telephone Encounter (Signed)
Called pt since she was due to have her INR checked on yesterday while out of town. She stated her and her son's family went camping and now her son only has Monday 12/26/2019 off so he will be able to take her at that time. Advised that we really wanted it checked this week and the risks (clot, cva, bleeding) of not having it monitored as recommended and she verbalized understanding. Pt confirmed she will go 12/26/2019.

## 2019-12-28 ENCOUNTER — Ambulatory Visit (INDEPENDENT_AMBULATORY_CARE_PROVIDER_SITE_OTHER): Payer: PPO

## 2019-12-28 ENCOUNTER — Other Ambulatory Visit: Payer: Self-pay

## 2019-12-28 ENCOUNTER — Ambulatory Visit: Payer: PPO | Admitting: Internal Medicine

## 2019-12-28 VITALS — BP 124/68 | HR 91 | Ht 68.0 in | Wt 219.0 lb

## 2019-12-28 DIAGNOSIS — Z5181 Encounter for therapeutic drug level monitoring: Secondary | ICD-10-CM

## 2019-12-28 DIAGNOSIS — I4891 Unspecified atrial fibrillation: Secondary | ICD-10-CM

## 2019-12-28 DIAGNOSIS — Z952 Presence of prosthetic heart valve: Secondary | ICD-10-CM

## 2019-12-28 LAB — CBC WITH DIFFERENTIAL/PLATELET
Basophils Absolute: 0.1 10*3/uL (ref 0.0–0.2)
Basos: 1 %
EOS (ABSOLUTE): 0.2 10*3/uL (ref 0.0–0.4)
Eos: 2 %
Hematocrit: 34.9 % (ref 34.0–46.6)
Hemoglobin: 12.2 g/dL (ref 11.1–15.9)
Immature Grans (Abs): 0 10*3/uL (ref 0.0–0.1)
Immature Granulocytes: 0 %
Lymphocytes Absolute: 1.6 10*3/uL (ref 0.7–3.1)
Lymphs: 22 %
MCH: 30.5 pg (ref 26.6–33.0)
MCHC: 35 g/dL (ref 31.5–35.7)
MCV: 87 fL (ref 79–97)
Monocytes Absolute: 0.6 10*3/uL (ref 0.1–0.9)
Monocytes: 8 %
Neutrophils Absolute: 4.9 10*3/uL (ref 1.4–7.0)
Neutrophils: 67 %
Platelets: 267 10*3/uL (ref 150–450)
RBC: 4 x10E6/uL (ref 3.77–5.28)
RDW: 13.4 % (ref 11.7–15.4)
WBC: 7.4 10*3/uL (ref 3.4–10.8)

## 2019-12-28 LAB — BASIC METABOLIC PANEL
BUN/Creatinine Ratio: 18 (ref 12–28)
BUN: 20 mg/dL (ref 8–27)
CO2: 22 mmol/L (ref 20–29)
Calcium: 9.4 mg/dL (ref 8.7–10.3)
Chloride: 99 mmol/L (ref 96–106)
Creatinine, Ser: 1.11 mg/dL — ABNORMAL HIGH (ref 0.57–1.00)
GFR calc Af Amer: 59 mL/min/{1.73_m2} — ABNORMAL LOW (ref >59)
GFR calc non Af Amer: 51 mL/min/{1.73_m2} — ABNORMAL LOW (ref >59)
Glucose: 94 mg/dL (ref 65–99)
Potassium: 4.2 mmol/L (ref 3.5–5.2)
Sodium: 136 mmol/L (ref 134–144)

## 2019-12-28 LAB — POCT INR: INR: 2.8 (ref 2.0–3.0)

## 2019-12-28 MED ORDER — AMIODARONE HCL 200 MG PO TABS: 200.0000 mg | ORAL_TABLET | Freq: Two times a day (BID) | ORAL | 0 refills | Status: DC

## 2019-12-28 NOTE — H&P (View-Only) (Signed)
HPI [name] is referred today by Dr. Tamala Julian for evaluation of persistent atrial fib. She is a pleasant 69 yo woman with bipolar disorder, AI, s/p AVR, sinus node dysfunction and prolongation of the QT due to taking multiple meds for her bipolar disorder. She notes that her bipolar is well controlled on her QT prolonging meds. She has a h/o viteous hemorrhage as well. She anti-coagulation has been held. She is quite anxious about this. She has worn a cardiac monitor and appears to be out of rhythm for a couple of years. She had previously taken amiodarone but does not have a recolection as to why it was stopped. Allergies  Allergen Reactions  . Mucinex [Guaifenesin Er] Other (See Comments)    Severe headaches   . Keflex [Cephalexin] Other (See Comments)    Headaches and dizziness  . Sulfa Antibiotics   . Sulfonamide Derivatives Other (See Comments)    Headaches      Current Outpatient Medications  Medication Sig Dispense Refill  . acetaminophen (TYLENOL) 500 MG tablet Take 1,000-1,500 mg by mouth daily as needed (for knee pain).     Marland Kitchen amLODipine (NORVASC) 5 MG tablet Take 1 tablet (5 mg total) by mouth daily. 90 tablet 2  . aspirin EC 81 MG tablet Take 81 mg by mouth daily.    Marland Kitchen BIOTIN PO Take 1 tablet by mouth daily with breakfast.    . fenofibrate micronized (ANTARA) 130 MG capsule Take 1 capsule (130 mg total) by mouth daily before breakfast. 30 capsule 3  . furosemide (LASIX) 40 MG tablet Take 1 tablet (40 mg total) by mouth daily. 90 tablet 3  . glucose blood (ONETOUCH VERIO) test strip Check blood sugars once daily Dx: E11.9 100 each 12  . lamoTRIgine (LAMICTAL) 100 MG tablet Take 100 mg by mouth in the morning and 200 mg by mouth at bedtime 270 tablet 1  . Lancets (ONETOUCH ULTRASOFT) lancets Use as instructed to test blood sugars once daily Dx. E11.65 100 each 12  . metFORMIN (GLUCOPHAGE-XR) 500 MG 24 hr tablet Take 500 mg by mouth 2 (two) times daily.    . metoprolol  succinate (TOPROL-XL) 50 MG 24 hr tablet Take 1 tablet by mouth in AM and take 1/2 tablet by mouth in PM. Take with or immediately following a meal. 135 tablet 3  . Multiple Vitamins-Minerals (CENTRUM SILVER 50+WOMEN PO) Take 1 tablet by mouth daily with breakfast.    . ONE TOUCH LANCETS MISC Test once daily to check blood sugar. DX E11.9 100 each 3  . OXcarbazepine (TRILEPTAL) 150 MG tablet Take 1 tablet (150 mg total) by mouth 2 (two) times daily. 180 tablet 1  . pantoprazole (PROTONIX) 40 MG tablet Take 1 tablet (40 mg total) by mouth daily. 90 tablet 3  . QUEtiapine (SEROQUEL XR) 300 MG 24 hr tablet Take 2 tablets (600 mg total) by mouth every evening. 180 tablet 1  . QUEtiapine (SEROQUEL) 100 MG tablet Take 1/2-1 tab po QHS 90 tablet 1  . rOPINIRole (REQUIP) 0.25 MG tablet Take 1-2 tablets (0.25-0.5 mg total) by mouth at bedtime. 180 tablet 0  . rosuvastatin (CRESTOR) 40 MG tablet Take 1 tablet (40 mg total) by mouth daily. 90 tablet 1  . SUMAtriptan (IMITREX) 50 MG tablet TAKE ONE TABLET EVERY 2 HOURS AS NEEDED FOR MIGRAINE. MAY REPEAT IN 2 HOURS IF HEADACHE PERSISTS OR RECURS. (MAX OF 2 TABLETS IN 24 HOURS) 10 tablet 0  . traMADol (ULTRAM)  50 MG tablet Take 50 mg by mouth every 6 (six) hours as needed (for pain).     Marland Kitchen venlafaxine XR (EFFEXOR XR) 75 MG 24 hr capsule Take 1 capsule (75 mg total) by mouth daily with breakfast. 90 capsule 1  . warfarin (COUMADIN) 5 MG tablet Take as directed by [facility]. 110 tablet 1   No current facility-administered medications for this visit.     Past Medical History:  Diagnosis Date  . ANEMIA 05/28/2010  . AORTIC VALVE REPLACEMENT, HX OF 03/11/2010   Mechanical prosthesis  . BIPOLAR AFFECTIVE DISORDER 03/11/2010  . Depression   . Diverticulitis 12/18/2010  . FIBROIDS, UTERUS 03/11/2010  . Gout 09/04/2016  . Grave's disease 8-12  . Hyperlipidemia associated with type 2 diabetes mellitus ([place]) 06/05/2016  . Hypertension   . Low back pain  11/02/2017  . Migraine 06/05/2016  . Mixed hyperlipidemia 03/11/2010  . Obesity   . Skin cancer   . Syncope 08/22/2019  . UTI (lower urinary tract infection) 08/14/2011    ROS:   All systems reviewed and negative except as noted in the HPI.   Past Surgical History:  Procedure Laterality Date  . ABDOMINAL HYSTERECTOMY  ?1998   sb/l spo, total for fibroids/heavy bleeding  . ANKLE SURGERY Left 01/2018   hardware removal  . AORTIC VALVE REPLACEMENT  2011   Mechanical prosthesis, [employer]  . APPENDECTOMY     Required revision for EColi infection, required recurrent packing  . bowel obstruction     Requiring adhesions to be removed  . CHOLECYSTECTOMY    . COLONOSCOPY WITH PROPOFOL N/A 11/01/2015   Procedure: COLONOSCOPY WITH PROPOFOL;  Surgeon: Milus Banister, MD;  Location: [facility];  Service: Endoscopy;  Laterality: N/A;  . CYSTOCELE REPAIR N/A 10/16/2015   Procedure: ANTERIOR REPAIR (CYSTOCELE);  Surgeon: Donnamae Jude, MD;  Location: [facility];  Service: Gynecology;  Laterality: N/A;  . HERNIA REPAIR  08-16-10  . LEFT HEART CATHETERIZATION WITH CORONARY ANGIOGRAM N/A 12/05/2011   Procedure: LEFT HEART CATHETERIZATION WITH CORONARY ANGIOGRAM;  Surgeon: Sinclair Grooms, MD;  Location: [facility];  Service: Cardiovascular;  Laterality: N/A;  . TOTAL KNEE ARTHROPLASTY Right 02/24/2013   Procedure: RIGHT TOTAL KNEE ARTHROPLASTY;  Surgeon: Johnn Hai, MD;  Location: [facility];  Service: Orthopedics;  Laterality: Right;  . TUBAL LIGATION    . varicose vein surgery b/l legs       Family History  Problem Relation Age of Onset  . Scoliosis Mother   . Arthritis Mother        Rheumatoid  . Aneurysm Mother        heart  . Alcohol abuse Father   . Depression Sister   . Depression Brother   . Alcohol abuse Brother   . Cancer Maternal Grandmother        colon  . Diabetes Maternal Grandfather   . Heart disease Maternal Grandfather   . Alcohol abuse Maternal Grandfather   .  Depression Paternal Grandmother   . Diabetes Paternal Grandmother   . Depression Paternal Grandfather   . Diabetes Paternal Grandfather   . Depression Sister   . Alcohol abuse Brother   . Depression Brother   . Heart disease Brother      Social History   Socioeconomic History  . Marital status: Married    Spouse name: Not on file  . Number of children: Not on file  . Years of education: Not on file  .  Highest education level: Not on file  Occupational History  . Not on file  Tobacco Use  . Smoking status: Former Smoker    Quit date: 05/26/1990    Years since quitting: 29.6  . Smokeless tobacco: Never Used  Vaping Use  . Vaping Use: Never used  Substance and Sexual Activity  . Alcohol use: Yes    Alcohol/week: 0.0 standard drinks    Comment: rare  . Drug use: No  . Sexual activity: Not Currently  Other Topics Concern  . Not on file  Social History Narrative  . Not on file   Social Determinants of Health   Financial Resource Strain:   . Difficulty of Paying Living Expenses:   Food Insecurity:   . Worried About Charity fundraiser in the Last Year:   . Arboriculturist in the Last Year:   Transportation Needs:   . Film/video editor (Medical):   Marland Kitchen Lack of Transportation (Non-Medical):   Physical Activity:   . Days of Exercise per Week:   . Minutes of Exercise per Session:   Stress:   . Feeling of Stress :   Social Connections:   . Frequency of Communication with Friends and Family:   . Frequency of Social Gatherings with Friends and Family:   . Attends Religious Services:   . Active Member of Clubs or Organizations:   . Attends Archivist Meetings:   Marland Kitchen Marital Status:   Intimate Partner Violence:   . Fear of Current or Ex-Partner:   . Emotionally Abused:   Marland Kitchen Physically Abused:   . Sexually Abused:      BP 124/68   Pulse 91   Ht 5\' 8"  (1.727 m)   Wt 219 lb (99.3 kg)   SpO2 96%   BMI 33.30 kg/m   Physical Exam:  Well appearing  NAD HEENT: Unremarkable Neck:  No JVD, no thyromegally Lymphatics:  No adenopathy Back:  No CVA tenderness Lungs:  Clear HEART:  IRegular rate rhythm, no murmurs, no rubs, no clicks; prominent aortic valve closure sounds Abd:  soft, positive bowel sounds, no organomegally, no rebound, no guarding Ext:  2 plus pulses, no edema, no cyanosis, no clubbing Skin:  No rashes no nodules Neuro:  CN II through XII intact, motor grossly intact  EKG - none Cardiac monitor - reviewed with atrial fib and a very slow vr, controlled VR and a fast VR   Assess/Plan: 1. Atrial fib/flutter - I have discussed her options in detail. Because she is slow as well as fast, we are limited in our medication choices. Also, on the meds for bipolar, she has a prolonged QT making dofetilide a poor choice. I think we could try amiodarone, and place a ppm. We also discussed trying amiodarone alone. My concern would be in trying to cardiovert her and the sinus node dysfunction leading to profound post conversion bradycardia. We alos discussed continuing as is. She will call us as to how she would like to proceed. 2. Chronic systolic/diastolic CHF - her symptoms are class 3 in atrial fib. We will follow. 3. AI - she is sp valve replacement with a mechanical prosthesis.  Carleene Overlie Jamey Harman,MD

## 2019-12-28 NOTE — Patient Instructions (Signed)
Description   Start taking 1 tablet daily except for 1.5 tablets on Mondays, Wednesdays and Fridays. Pt starting on Amiodarone 200mg  BID today 12/28/19.  Recheck in 1 week.  Pt scheduled for pacemaker insertion 01/09/20.  Pt was instructed by Dr Lovena Le to hold Warfarin 2 days prior to procedure on 01/09/20.  Continue to be consistent with green leafy intake. Recheck INR in 1 week.  [facility] [phone]

## 2019-12-28 NOTE — Patient Instructions (Addendum)
Medication Instructions:  Your physician has recommended you make the following change in your medication:   1.  Start taking amiodarone 200 mg-  Take one tablet by mouth twice a day  Labwork: You will get lab work today:  BMP and CBC  Testing/Procedures: Your physician has recommended that you have a pacemaker inserted. A pacemaker is a small device that is placed under the skin of your chest or abdomen to help control abnormal heart rhythms. This device uses electrical pulses to prompt the heart to beat at a normal rate. Pacemakers are used to treat heart rhythms that are too slow. Wire (leads) are attached to the pacemaker that goes into the chambers of you heart. This is done in the hospital and usually requires and overnight stay. Please see the instruction sheet given to you today for more information.   Follow-Up:  SEE INSTRUCTION LETTER  Any Other Special Instructions Will Be Listed Below (If Applicable).  If you need a refill on your cardiac medications before your next appointment, please call your pharmacy.    Pacemaker Implantation, Adult Pacemaker implantation is a procedure to place a pacemaker inside your chest. A pacemaker is a small computer that sends electrical signals to the heart and helps your heart beat normally. A pacemaker also stores information about your heart rhythms. You may need pacemaker implantation if you:  Have a slow heartbeat (bradycardia).  Faint (syncope).  Have shortness of breath (dyspnea) due to heart problems. The pacemaker attaches to your heart through a wire, called a lead. Sometimes just one lead is needed. Other times, there will be two leads. There are two types of pacemakers:  Transvenous pacemaker. This type is placed under the skin or muscle of your chest. The lead goes through a vein in the chest area to reach the inside of the heart.  Epicardial pacemaker. This type is placed under the skin or muscle of your chest or belly. The  lead goes through your chest to the outside of the heart. Tell a health care provider about:  Any allergies you have.  All medicines you are taking, including vitamins, herbs, eye drops, creams, and over-the-counter medicines.  Any problems you or family members have had with anesthetic medicines.  Any blood or bone disorders you have.  Any surgeries you have had.  Any medical conditions you have.  Whether you are pregnant or may be pregnant. What are the risks? Generally, this is a safe procedure. However, problems may occur, including:  Infection.  Bleeding.  Failure of the pacemaker or the lead.  Collapse of a lung or bleeding into a lung.  Blood clot inside a blood vessel with a lead.  Damage to the heart.  Infection inside the heart (endocarditis).  Allergic reactions to medicines. What happens before the procedure? Staying hydrated Follow instructions from your health care provider about hydration, which may include:  Up to 2 hours before the procedure - you may continue to drink clear liquids, such as water, clear fruit juice, black coffee, and plain tea. Eating and drinking restrictions Follow instructions from your health care provider about eating and drinking, which may include:  8 hours before the procedure - stop eating heavy meals or foods such as meat, fried foods, or fatty foods.  6 hours before the procedure - stop eating light meals or foods, such as toast or cereal.  6 hours before the procedure - stop drinking milk or drinks that contain milk.  2 hours before the procedure -  stop drinking clear liquids. Medicines  Ask your health care provider about: ? Changing or stopping your regular medicines. This is especially important if you are taking diabetes medicines or blood thinners. ? Taking medicines such as aspirin and ibuprofen. These medicines can thin your blood. Do not take these medicines before your procedure if your health care provider  instructs you not to.  You may be given antibiotic medicine to help prevent infection. General instructions  You will have a heart evaluation. This may include an electrocardiogram (ECG), chest X-ray, and heart imaging (echocardiogram,  or echo) tests.  You will have blood tests.  Do not use any products that contain nicotine or tobacco, such as cigarettes and e-cigarettes. If you need help quitting, ask your health care provider.  Plan to have someone take you home from the hospital or clinic.  If you will be going home right after the procedure, plan to have someone with you for 24 hours.  Ask your health care provider how your surgical site will be marked or identified. What happens during the procedure?  To reduce your risk of infection: ? Your health care team will wash or sanitize their hands. ? Your skin will be washed with soap. ? Hair may be removed from the surgical area.  An IV tube will be inserted into one of your veins.  You will be given one or more of the following: ? A medicine to help you relax (sedative). ? A medicine to numb the area (local anesthetic). ? A medicine to make you fall asleep (general anesthetic).  If you are getting a transvenous pacemaker: ? An incision will be made in your upper chest. ? A pocket will be made for the pacemaker. It may be placed under the skin or between layers of muscle. ? The lead will be inserted into a blood vessel that returns to the heart. ? While X-rays are taken by an imaging machine (fluoroscopy), the lead will be advanced through the vein to the inside of your heart. ? The other end of the lead will be tunneled under the skin and attached to the pacemaker.  If you are getting an epicardial pacemaker: ? An incision will be made near your ribs or breastbone (sternum) for the lead. ? The lead will be attached to the outside of your heart. ? Another incision will be made in your chest or upper belly to create a pocket  for the pacemaker. ? The free end of the lead will be tunneled under the skin and attached to the pacemaker.  The transvenous or epicardial pacemaker will be tested. Imaging studies may be done to check the lead position.  The incisions will be closed with stitches (sutures), adhesive strips, or skin glue.  Bandages (dressing) will be placed over the incisions. The procedure may vary among health care providers and hospitals. What happens after the procedure?  Your blood pressure, heart rate, breathing rate, and blood oxygen level will be monitored until the medicines you were given have worn off.  You will be given antibiotics and pain medicine.  ECG and chest x-rays will be done.  You will wear a continuous type of ECG (Holter monitor) to check your heart rhythm.  Your health care provider will program the pacemaker.  Do not drive for 24 hours if you received a sedative. This information is not intended to replace advice given to you by your health care provider. Make sure you discuss any questions you have with your  health care provider. Document Revised: 01/29/2018 Document Reviewed: 10/24/2015 Elsevier Patient Education  [place].

## 2019-12-28 NOTE — Progress Notes (Signed)
HPI [name] is referred today by Dr. Tamala Julian for evaluation of persistent atrial fib. She is a pleasant 69 yo woman with bipolar disorder, AI, s/p AVR, sinus node dysfunction and prolongation of the QT due to taking multiple meds for her bipolar disorder. She notes that her bipolar is well controlled on her QT prolonging meds. She has a h/o viteous hemorrhage as well. She anti-coagulation has been held. She is quite anxious about this. She has worn a cardiac monitor and appears to be out of rhythm for a couple of years. She had previously taken amiodarone but does not have a recolection as to why it was stopped. Allergies  Allergen Reactions  . Mucinex [Guaifenesin Er] Other (See Comments)    Severe headaches   . Keflex [Cephalexin] Other (See Comments)    Headaches and dizziness  . Sulfa Antibiotics   . Sulfonamide Derivatives Other (See Comments)    Headaches      Current Outpatient Medications  Medication Sig Dispense Refill  . acetaminophen (TYLENOL) 500 MG tablet Take 1,000-1,500 mg by mouth daily as needed (for knee pain).     Marland Kitchen amLODipine (NORVASC) 5 MG tablet Take 1 tablet (5 mg total) by mouth daily. 90 tablet 2  . aspirin EC 81 MG tablet Take 81 mg by mouth daily.    Marland Kitchen BIOTIN PO Take 1 tablet by mouth daily with breakfast.    . fenofibrate micronized (ANTARA) 130 MG capsule Take 1 capsule (130 mg total) by mouth daily before breakfast. 30 capsule 3  . furosemide (LASIX) 40 MG tablet Take 1 tablet (40 mg total) by mouth daily. 90 tablet 3  . glucose blood (ONETOUCH VERIO) test strip Check blood sugars once daily Dx: E11.9 100 each 12  . lamoTRIgine (LAMICTAL) 100 MG tablet Take 100 mg by mouth in the morning and 200 mg by mouth at bedtime 270 tablet 1  . Lancets (ONETOUCH ULTRASOFT) lancets Use as instructed to test blood sugars once daily Dx. E11.65 100 each 12  . metFORMIN (GLUCOPHAGE-XR) 500 MG 24 hr tablet Take 500 mg by mouth 2 (two) times daily.    . metoprolol  succinate (TOPROL-XL) 50 MG 24 hr tablet Take 1 tablet by mouth in AM and take 1/2 tablet by mouth in PM. Take with or immediately following a meal. 135 tablet 3  . Multiple Vitamins-Minerals (CENTRUM SILVER 50+WOMEN PO) Take 1 tablet by mouth daily with breakfast.    . ONE TOUCH LANCETS MISC Test once daily to check blood sugar. DX E11.9 100 each 3  . OXcarbazepine (TRILEPTAL) 150 MG tablet Take 1 tablet (150 mg total) by mouth 2 (two) times daily. 180 tablet 1  . pantoprazole (PROTONIX) 40 MG tablet Take 1 tablet (40 mg total) by mouth daily. 90 tablet 3  . QUEtiapine (SEROQUEL XR) 300 MG 24 hr tablet Take 2 tablets (600 mg total) by mouth every evening. 180 tablet 1  . QUEtiapine (SEROQUEL) 100 MG tablet Take 1/2-1 tab po QHS 90 tablet 1  . rOPINIRole (REQUIP) 0.25 MG tablet Take 1-2 tablets (0.25-0.5 mg total) by mouth at bedtime. 180 tablet 0  . rosuvastatin (CRESTOR) 40 MG tablet Take 1 tablet (40 mg total) by mouth daily. 90 tablet 1  . SUMAtriptan (IMITREX) 50 MG tablet TAKE ONE TABLET EVERY 2 HOURS AS NEEDED FOR MIGRAINE. MAY REPEAT IN 2 HOURS IF HEADACHE PERSISTS OR RECURS. (MAX OF 2 TABLETS IN 24 HOURS) 10 tablet 0  . traMADol (ULTRAM)  50 MG tablet Take 50 mg by mouth every 6 (six) hours as needed (for pain).     Marland Kitchen venlafaxine XR (EFFEXOR XR) 75 MG 24 hr capsule Take 1 capsule (75 mg total) by mouth daily with breakfast. 90 capsule 1  . warfarin (COUMADIN) 5 MG tablet Take as directed by [facility] Clinic. 110 tablet 1   No current facility-administered medications for this visit.     Past Medical History:  Diagnosis Date  . ANEMIA 05/28/2010  . AORTIC VALVE REPLACEMENT, HX OF 03/11/2010   Mechanical prosthesis  . BIPOLAR AFFECTIVE DISORDER 03/11/2010  . Depression   . Diverticulitis 12/18/2010  . FIBROIDS, UTERUS 03/11/2010  . Gout 09/04/2016  . Grave's disease 8-12  . Hyperlipidemia associated with type 2 diabetes mellitus ([place]) 06/05/2016  . Hypertension   . Low back pain  11/02/2017  . Migraine 06/05/2016  . Mixed hyperlipidemia 03/11/2010  . Obesity   . Skin cancer   . Syncope 08/22/2019  . UTI (lower urinary tract infection) 08/14/2011    ROS:   All systems reviewed and negative except as noted in the HPI.   Past Surgical History:  Procedure Laterality Date  . ABDOMINAL HYSTERECTOMY  ?1998   sb/l spo, total for fibroids/heavy bleeding  . ANKLE SURGERY Left 01/2018   hardware removal  . AORTIC VALVE REPLACEMENT  2011   Mechanical prosthesis, [employer]. Jude  . APPENDECTOMY     Required revision for EColi infection, required recurrent packing  . bowel obstruction     Requiring adhesions to be removed  . CHOLECYSTECTOMY    . COLONOSCOPY WITH PROPOFOL N/A 11/01/2015   Procedure: COLONOSCOPY WITH PROPOFOL;  Surgeon: Milus Banister, MD;  Location: [facility];  Service: Endoscopy;  Laterality: N/A;  . CYSTOCELE REPAIR N/A 10/16/2015   Procedure: ANTERIOR REPAIR (CYSTOCELE);  Surgeon: Donnamae Jude, MD;  Location: [facility];  Service: Gynecology;  Laterality: N/A;  . HERNIA REPAIR  08-16-10  . LEFT HEART CATHETERIZATION WITH CORONARY ANGIOGRAM N/A 12/05/2011   Procedure: LEFT HEART CATHETERIZATION WITH CORONARY ANGIOGRAM;  Surgeon: Sinclair Grooms, MD;  Location: [facility];  Service: Cardiovascular;  Laterality: N/A;  . TOTAL KNEE ARTHROPLASTY Right 02/24/2013   Procedure: RIGHT TOTAL KNEE ARTHROPLASTY;  Surgeon: Johnn Hai, MD;  Location: [facility];  Service: Orthopedics;  Laterality: Right;  . TUBAL LIGATION    . varicose vein surgery b/l legs       Family History  Problem Relation Age of Onset  . Scoliosis Mother   . Arthritis Mother        Rheumatoid  . Aneurysm Mother        heart  . Alcohol abuse Father   . Depression Sister   . Depression Brother   . Alcohol abuse Brother   . Cancer Maternal Grandmother        colon  . Diabetes Maternal Grandfather   . Heart disease Maternal Grandfather   . Alcohol abuse Maternal Grandfather   .  Depression Paternal Grandmother   . Diabetes Paternal Grandmother   . Depression Paternal Grandfather   . Diabetes Paternal Grandfather   . Depression Sister   . Alcohol abuse Brother   . Depression Brother   . Heart disease Brother      Social History   Socioeconomic History  . Marital status: Married    Spouse name: Not on file  . Number of children: Not on file  . Years of education: Not on file  .  Highest education level: Not on file  Occupational History  . Not on file  Tobacco Use  . Smoking status: Former Smoker    Quit date: 05/26/1990    Years since quitting: 29.6  . Smokeless tobacco: Never Used  Vaping Use  . Vaping Use: Never used  Substance and Sexual Activity  . Alcohol use: Yes    Alcohol/week: 0.0 standard drinks    Comment: rare  . Drug use: No  . Sexual activity: Not Currently  Other Topics Concern  . Not on file  Social History Narrative  . Not on file   Social Determinants of Health   Financial Resource Strain:   . Difficulty of Paying Living Expenses:   Food Insecurity:   . Worried About Charity fundraiser in the Last Year:   . Arboriculturist in the Last Year:   Transportation Needs:   . Film/video editor (Medical):   Marland Kitchen Lack of Transportation (Non-Medical):   Physical Activity:   . Days of Exercise per Week:   . Minutes of Exercise per Session:   Stress:   . Feeling of Stress :   Social Connections:   . Frequency of Communication with Friends and Family:   . Frequency of Social Gatherings with Friends and Family:   . Attends Religious Services:   . Active Member of Clubs or Organizations:   . Attends Archivist Meetings:   Marland Kitchen Marital Status:   Intimate Partner Violence:   . Fear of Current or Ex-Partner:   . Emotionally Abused:   Marland Kitchen Physically Abused:   . Sexually Abused:      BP 124/68   Pulse 91   Ht 5\' 8"  (1.727 m)   Wt 219 lb (99.3 kg)   SpO2 96%   BMI 33.30 kg/m   Physical Exam:  Well appearing  NAD HEENT: Unremarkable Neck:  No JVD, no thyromegally Lymphatics:  No adenopathy Back:  No CVA tenderness Lungs:  Clear HEART:  IRegular rate rhythm, no murmurs, no rubs, no clicks; prominent aortic valve closure sounds Abd:  soft, positive bowel sounds, no organomegally, no rebound, no guarding Ext:  2 plus pulses, no edema, no cyanosis, no clubbing Skin:  No rashes no nodules Neuro:  CN II through XII intact, motor grossly intact  EKG - none Cardiac monitor - reviewed with atrial fib and a very slow vr, controlled VR and a fast VR   Assess/Plan: 1. Atrial fib/flutter - I have discussed her options in detail. Because she is slow as well as fast, we are limited in our medication choices. Also, on the meds for bipolar, she has a prolonged QT making dofetilide a poor choice. I think we could try amiodarone, and place a ppm. We also discussed trying amiodarone alone. My concern would be in trying to cardiovert her and the sinus node dysfunction leading to profound post conversion bradycardia. We alos discussed continuing as is. She will call us as to how she would like to proceed. 2. Chronic systolic/diastolic CHF - her symptoms are class 3 in atrial fib. We will follow. 3. AI - she is sp valve replacement with a mechanical prosthesis.  Carleene Overlie Deleon Passe,MD

## 2020-01-03 ENCOUNTER — Other Ambulatory Visit: Payer: Self-pay

## 2020-01-03 ENCOUNTER — Ambulatory Visit (INDEPENDENT_AMBULATORY_CARE_PROVIDER_SITE_OTHER): Payer: PPO | Admitting: *Deleted

## 2020-01-03 DIAGNOSIS — I4891 Unspecified atrial fibrillation: Secondary | ICD-10-CM

## 2020-01-03 DIAGNOSIS — Z5181 Encounter for therapeutic drug level monitoring: Secondary | ICD-10-CM

## 2020-01-03 DIAGNOSIS — Z952 Presence of prosthetic heart valve: Secondary | ICD-10-CM

## 2020-01-03 LAB — POCT INR: INR: 4 — AB (ref 2.0–3.0)

## 2020-01-03 NOTE — Patient Instructions (Signed)
Description   Do not take any warfarin today then start taking 1 tablet daily except for 1.5 tablets on Mondays and Fridays. Pt starting on Amiodarone 200mg  BID today 12/28/19.  Recheck in 1 week.   Pt scheduled for pacemaker insertion 01/09/20.  Pt was instructed by Dr Lovena Le to hold Warfarin 2 days prior to procedure on 01/09/20.  Continue to be consistent with green leafy intake. [facility] [phone]

## 2020-01-05 ENCOUNTER — Other Ambulatory Visit: Payer: Self-pay

## 2020-01-05 MED ORDER — AMLODIPINE BESYLATE 5 MG PO TABS: 5.0000 mg | ORAL_TABLET | Freq: Every day | ORAL | 2 refills | Status: DC

## 2020-01-06 ENCOUNTER — Other Ambulatory Visit (HOSPITAL_COMMUNITY)
Admission: RE | Admit: 2020-01-06 | Discharge: 2020-01-06 | Disposition: A | Discharge status: Home / Self Care | Payer: PPO | Source: Ambulatory Visit | Attending: Internal Medicine | Admitting: Internal Medicine

## 2020-01-06 ENCOUNTER — Telehealth: Payer: Self-pay | Admitting: Internal Medicine

## 2020-01-06 DIAGNOSIS — Z20822 Contact with and (suspected) exposure to covid-19: Secondary | ICD-10-CM | POA: Insufficient documentation

## 2020-01-06 DIAGNOSIS — Z01812 Encounter for preprocedural laboratory examination: Secondary | ICD-10-CM | POA: Insufficient documentation

## 2020-01-06 LAB — SARS CORONAVIRUS 2 (TAT 6-24 HRS): SARS Coronavirus 2: NEGATIVE

## 2020-01-06 MED ORDER — METOPROLOL SUCCINATE ER 25 MG PO TB24
25.0000 mg | ORAL_TABLET | Freq: Every day | ORAL | 1 refills | Status: DC
Start: 2020-01-06 — End: 2020-03-31

## 2020-01-06 MED ORDER — VANCOMYCIN HCL 1500 MG/300ML IV SOLN
1500.0000 mg | INTRAVENOUS | Status: DC
  Filled 2020-01-06 (×3): qty 300

## 2020-01-06 MED ORDER — VANCOMYCIN HCL 1500 MG/300ML IV SOLN
1500.0000 mg | INTRAVENOUS | Status: AC
  Administered 2020-01-09: 1500 mg via INTRAVENOUS
  Filled 2020-01-06 (×3): qty 300

## 2020-01-06 NOTE — Telephone Encounter (Signed)
I spoke with patient's daughter. She reports patient has been feeling more lightheaded over the last several days.  Similar to when she fell in the past which prompted monitor to be ordered.  Patient is scheduled for pacemaker insertion on 8/16. This morning patient was sitting on the side of the bed. When she stood up she felt very dizzy. Was able to fall into chair.  Did not pass out. Blood sugar was 111. BP 113/54, heart rate 49. She took AM medications about an hour ago.  Was feeling dizzy prior to taking medications.  Daughter checked heart rate and it is currently 25.  Lightheadedness is about the same.  Is not worsening.  Daughter states over the last several days there have been wide fluctuations in heart rate. Sometimes it has been elevated and other times low.  Will review with Dr Tamala Julian. I advised daughter if patient were to pass out, if dizziness increases or she has any change in symptoms she should call 911.

## 2020-01-06 NOTE — Progress Notes (Signed)
Instructed patient on the following items: Arrival time 0530 Nothing to eat or drink after midnight No meds AM of procedure Responsible person to drive you home and stay with you for 24 hrs Wash with special soap night before and morning of procedure If on anti-coagulant drug instructions Coumadin last dose 8/13 pm dose

## 2020-01-06 NOTE — Telephone Encounter (Signed)
I spoke with patient's daughter and gave her instructions from Dr Tamala Julian.   Daughter reports current dose is Toprol 50 mg daily. Patient has taken already this morning.  Patient will decrease to 25 mg daily starting tomorrow.

## 2020-01-06 NOTE — Telephone Encounter (Signed)
New message:     Daughter calling stating that her mother is very dizzy and light headed very off balance and weak. Patient is also getting a pacer on Monday. Patient BP this morning 113/54.

## 2020-01-06 NOTE — Telephone Encounter (Signed)
Continue amiodarone as ordered by Dr. Lovena Le. Decrease Metoprolol succinate to 25 mg daily. Start in AM. I am assuming she already to 50 mg this morning.

## 2020-01-07 DIAGNOSIS — R42 Dizziness and giddiness: Secondary | ICD-10-CM | POA: Diagnosis not present

## 2020-01-09 ENCOUNTER — Ambulatory Visit (HOSPITAL_COMMUNITY)
Admission: RE | Admit: 2020-01-09 | Discharge: 2020-01-09 | Disposition: A | Discharge status: Home / Self Care | Payer: PPO | Attending: Internal Medicine | Admitting: Internal Medicine

## 2020-01-09 ENCOUNTER — Other Ambulatory Visit: Payer: Self-pay

## 2020-01-09 ENCOUNTER — Encounter (HOSPITAL_COMMUNITY)
Admission: RE | Disposition: A | Discharge status: Home / Self Care | Payer: Self-pay | Source: Home / Self Care | Attending: Internal Medicine

## 2020-01-09 ENCOUNTER — Ambulatory Visit (HOSPITAL_COMMUNITY): Payer: PPO

## 2020-01-09 DIAGNOSIS — Z7901 Long term (current) use of anticoagulants: Secondary | ICD-10-CM | POA: Insufficient documentation

## 2020-01-09 DIAGNOSIS — I110 Hypertensive heart disease with heart failure: Secondary | ICD-10-CM | POA: Diagnosis not present

## 2020-01-09 DIAGNOSIS — Z7982 Long term (current) use of aspirin: Secondary | ICD-10-CM | POA: Diagnosis not present

## 2020-01-09 DIAGNOSIS — I5042 Chronic combined systolic (congestive) and diastolic (congestive) heart failure: Secondary | ICD-10-CM | POA: Diagnosis not present

## 2020-01-09 DIAGNOSIS — J90 Pleural effusion, not elsewhere classified: Secondary | ICD-10-CM | POA: Diagnosis not present

## 2020-01-09 DIAGNOSIS — Z7984 Long term (current) use of oral hypoglycemic drugs: Secondary | ICD-10-CM | POA: Insufficient documentation

## 2020-01-09 DIAGNOSIS — F319 Bipolar disorder, unspecified: Secondary | ICD-10-CM | POA: Diagnosis not present

## 2020-01-09 DIAGNOSIS — Z882 Allergy status to sulfonamides status: Secondary | ICD-10-CM | POA: Insufficient documentation

## 2020-01-09 DIAGNOSIS — E119 Type 2 diabetes mellitus without complications: Secondary | ICD-10-CM | POA: Insufficient documentation

## 2020-01-09 DIAGNOSIS — I495 Sick sinus syndrome: Secondary | ICD-10-CM | POA: Diagnosis not present

## 2020-01-09 DIAGNOSIS — E782 Mixed hyperlipidemia: Secondary | ICD-10-CM | POA: Insufficient documentation

## 2020-01-09 DIAGNOSIS — M109 Gout, unspecified: Secondary | ICD-10-CM | POA: Diagnosis not present

## 2020-01-09 DIAGNOSIS — Z952 Presence of prosthetic heart valve: Secondary | ICD-10-CM | POA: Diagnosis not present

## 2020-01-09 DIAGNOSIS — Z87891 Personal history of nicotine dependence: Secondary | ICD-10-CM | POA: Diagnosis not present

## 2020-01-09 DIAGNOSIS — Z79899 Other long term (current) drug therapy: Secondary | ICD-10-CM | POA: Insufficient documentation

## 2020-01-09 DIAGNOSIS — I4819 Other persistent atrial fibrillation: Secondary | ICD-10-CM | POA: Diagnosis not present

## 2020-01-09 DIAGNOSIS — Z881 Allergy status to other antibiotic agents status: Secondary | ICD-10-CM | POA: Insufficient documentation

## 2020-01-09 DIAGNOSIS — Z950 Presence of cardiac pacemaker: Secondary | ICD-10-CM

## 2020-01-09 DIAGNOSIS — Z6841 Body Mass Index (BMI) 40.0 and over, adult: Secondary | ICD-10-CM | POA: Diagnosis not present

## 2020-01-09 DIAGNOSIS — R001 Bradycardia, unspecified: Secondary | ICD-10-CM | POA: Diagnosis present

## 2020-01-09 DIAGNOSIS — I4891 Unspecified atrial fibrillation: Secondary | ICD-10-CM | POA: Diagnosis present

## 2020-01-09 DIAGNOSIS — E669 Obesity, unspecified: Secondary | ICD-10-CM | POA: Diagnosis not present

## 2020-01-09 HISTORY — PX: PACEMAKER IMPLANT: EP1218

## 2020-01-09 LAB — PROTIME-INR
INR: 2.4 — ABNORMAL HIGH (ref 0.8–1.2)
Prothrombin Time: 25.7 seconds — ABNORMAL HIGH (ref 11.4–15.2)

## 2020-01-09 LAB — GLUCOSE, CAPILLARY: Glucose-Capillary: 124 mg/dL — ABNORMAL HIGH (ref 70–99)

## 2020-01-09 SURGERY — PACEMAKER IMPLANT

## 2020-01-09 MED ORDER — MIDAZOLAM HCL 5 MG/5ML IJ SOLN
INTRAMUSCULAR | Status: DC | PRN
  Administered 2020-01-09 (×4): 1 mg via INTRAVENOUS

## 2020-01-09 MED ORDER — MIDAZOLAM HCL 5 MG/5ML IJ SOLN
INTRAMUSCULAR | Status: AC
  Filled 2020-01-09: qty 5

## 2020-01-09 MED ORDER — SODIUM CHLORIDE 0.9 % IV SOLN
INTRAVENOUS | Status: AC
  Filled 2020-01-09: qty 2

## 2020-01-09 MED ORDER — SODIUM CHLORIDE 0.9 % IV SOLN
80.0000 mg | INTRAVENOUS | Status: AC
  Administered 2020-01-09: 80 mg
  Filled 2020-01-09: qty 2

## 2020-01-09 MED ORDER — ACETAMINOPHEN 325 MG PO TABS
325.0000 mg | ORAL_TABLET | ORAL | Status: DC | PRN
  Filled 2020-01-09: qty 2

## 2020-01-09 MED ORDER — LIDOCAINE HCL (PF) 1 % IJ SOLN
INTRAMUSCULAR | Status: DC | PRN
  Administered 2020-01-09: 60 mL
  Administered 2020-01-09: 20 mL

## 2020-01-09 MED ORDER — HEPARIN (PORCINE) IN NACL 1000-0.9 UT/500ML-% IV SOLN
INTRAVENOUS | Status: DC | PRN
  Administered 2020-01-09: 500 mL

## 2020-01-09 MED ORDER — FENTANYL CITRATE (PF) 100 MCG/2ML IJ SOLN
INTRAMUSCULAR | Status: AC
  Filled 2020-01-09: qty 2

## 2020-01-09 MED ORDER — SODIUM CHLORIDE 0.9 % IV SOLN: INTRAVENOUS | Status: DC

## 2020-01-09 MED ORDER — IOHEXOL 350 MG/ML SOLN
INTRAVENOUS | Status: DC | PRN
  Administered 2020-01-09: 50 mL

## 2020-01-09 MED ORDER — LIDOCAINE HCL 1 % IJ SOLN
INTRAMUSCULAR | Status: AC
  Filled 2020-01-09: qty 20

## 2020-01-09 MED ORDER — HEPARIN (PORCINE) IN NACL 1000-0.9 UT/500ML-% IV SOLN
INTRAVENOUS | Status: AC
  Filled 2020-01-09: qty 500

## 2020-01-09 MED ORDER — FENTANYL CITRATE (PF) 100 MCG/2ML IJ SOLN
INTRAMUSCULAR | Status: DC | PRN
  Administered 2020-01-09 (×4): 12.5 ug via INTRAVENOUS

## 2020-01-09 MED ORDER — CHLORHEXIDINE GLUCONATE 4 % EX LIQD
4.0000 "application " | Freq: Once | CUTANEOUS | Status: DC
  Filled 2020-01-09: qty 60

## 2020-01-09 SURGICAL SUPPLY — 11 items
CABLE SURGICAL S-101-97-12 (CABLE) ×3 IMPLANT
CATH SELECT PACE 669183 (CATHETERS) ×3 IMPLANT
CUTTER LV DELIVERY CATHETER 7 (MISCELLANEOUS) ×3 IMPLANT
LEAD INGEVITY 7841 52 (Lead) ×3 IMPLANT
LEAD INGEVITY 7842 59 (Lead) ×3 IMPLANT
PACEMAKER ACCOLADE DR-EL (Pacemaker) ×3 IMPLANT
PAD PRO RADIOLUCENT 2001M-C (PAD) ×3 IMPLANT
SHEATH 7FR PRELUDE SNAP 13 (SHEATH) ×3 IMPLANT
SHEATH 8FR PRELUDE SNAP 13 (SHEATH) ×3 IMPLANT
TRAY PACEMAKER INSERTION (PACKS) ×3 IMPLANT
WIRE HI TORQ VERSACORE-J 145CM (WIRE) ×3 IMPLANT

## 2020-01-09 NOTE — Interval H&P Note (Signed)
History and Physical Interval Note:  01/09/2020 8:12 AM  [name]  has presented today for surgery, with the diagnosis of bradycardia.  The various methods of treatment have been discussed with the patient and family. After consideration of risks, benefits and other options for treatment, the patient has consented to  Procedure(s): PACEMAKER IMPLANT (N/A) as a surgical intervention.  The patient's history has been reviewed, patient examined, no change in status, stable for surgery.  I have reviewed the patient's chart and labs.  Questions were answered to the patient's satisfaction.     Cristopher Peru

## 2020-01-09 NOTE — Discharge Instructions (Signed)
After Your Pacemaker   . You have a St. Jude Pacemaker  ACTIVITY . Do not lift your arm above shoulder height for 1 week after your procedure. After 7 days, you may progress as below.     Monday January 16, 2020  Tuesday January 17, 2020 Wednesday January 18, 2020 Thursday January 19, 2020   . Do not lift, push, pull, or carry anything over 10 pounds with the affected arm until 6 weeks (Monday February 20, 2020 ) after your procedure.   . Do NOT DRIVE until you have been seen for your wound check, or as long as instructed by your healthcare provider.   . Ask your healthcare provider when you can go back to work   INCISION/Dressing . If you are on a blood thinner such as Coumadin, Xarelto, Eliquis, Plavix, or Pradaxa please confirm with your provider when this should be resumed.   . Monitor your Pacemaker site for redness, swelling, and drainage. Call the device clinic at [phone] if you experience these symptoms or fever/chills.  . If your incision is sealed with Steri-strips or staples, you may shower 10 days after your procedure or when told by your provider. Do not remove the steri-strips or let the shower hit directly on your site. You may wash around your site with soap and water.    Marland Kitchen Avoid lotions, ointments, or perfumes over your incision until it is well-healed.  . You may use a hot tub or a pool AFTER your wound check appointment if the incision is completely closed.  Marland Kitchen PAcemaker Alerts:  Some alerts are vibratory and others beep. These are NOT emergencies. Please call our office to let us know. If this occurs at night or on weekends, it can wait until the next business day. Send a remote transmission.  . If your device is capable of reading fluid status (for heart failure), you will be offered monthly monitoring to review this with you.   DEVICE MANAGEMENT . Remote monitoring is used to monitor your pacemaker from home. This monitoring is scheduled every 91 days by our  office. It allows [place] to keep an eye on the functioning of your device to ensure it is working properly. You will routinely see your Electrophysiologist annually (more often if necessary).   . You should receive your ID card for your new device in 4-8 weeks. Keep this card with you at all times once received. Consider wearing a medical alert bracelet or necklace.  . Your Pacemaker may be MRI compatible. This will be discussed at your next office visit/wound check.  You should avoid contact with strong electric or magnetic fields.    Do not use amateur (ham) radio equipment or electric (arc) welding torches. MP3 player headphones with magnets should not be used. Some devices are safe to use if held at least 12 inches (30 cm) from your Pacemaker. These include power tools, lawn mowers, and speakers. If you are unsure if something is safe to use, ask your health care provider.   When using your cell phone, hold it to the ear that is on the opposite side from the Pacemaker. Do not leave your cell phone in a pocket over the Pacemaker.   You may safely use electric blankets, heating pads, computers, and microwave ovens.  Call the office right away if:  You have chest pain.  You feel more short of breath than you have felt before.  You feel more light-headed than you have felt before.  Your incision starts to open up.  This information is not intended to replace advice given to you by your health care provider. Make sure you discuss any questions you have with your health care provider.

## 2020-01-10 ENCOUNTER — Encounter (HOSPITAL_COMMUNITY): Payer: Self-pay | Admitting: Internal Medicine

## 2020-01-13 ENCOUNTER — Other Ambulatory Visit: Payer: Self-pay

## 2020-01-13 ENCOUNTER — Ambulatory Visit (INDEPENDENT_AMBULATORY_CARE_PROVIDER_SITE_OTHER): Payer: PPO | Admitting: *Deleted

## 2020-01-13 DIAGNOSIS — Z952 Presence of prosthetic heart valve: Secondary | ICD-10-CM | POA: Diagnosis not present

## 2020-01-13 DIAGNOSIS — Z5181 Encounter for therapeutic drug level monitoring: Secondary | ICD-10-CM

## 2020-01-13 DIAGNOSIS — I4891 Unspecified atrial fibrillation: Secondary | ICD-10-CM

## 2020-01-13 LAB — POCT INR: INR: 2 (ref 2.0–3.0)

## 2020-01-13 NOTE — Patient Instructions (Addendum)
Description   Continue to take 1 tablet daily except for 1.5 tablets on Mondays and Fridays. Recheck INR in 1 week.  Pt started taking amio 200mg  BID 8/4, Pacemaker insertion 8/16. [facility] [phone]

## 2020-01-19 ENCOUNTER — Other Ambulatory Visit: Payer: Self-pay | Admitting: Internal Medicine

## 2020-01-19 ENCOUNTER — Ambulatory Visit (INDEPENDENT_AMBULATORY_CARE_PROVIDER_SITE_OTHER): Payer: PPO | Admitting: Pharmacist

## 2020-01-19 ENCOUNTER — Other Ambulatory Visit: Payer: Self-pay

## 2020-01-19 ENCOUNTER — Ambulatory Visit (INDEPENDENT_AMBULATORY_CARE_PROVIDER_SITE_OTHER): Payer: PPO | Admitting: Emergency Medicine

## 2020-01-19 DIAGNOSIS — I4819 Other persistent atrial fibrillation: Secondary | ICD-10-CM

## 2020-01-19 DIAGNOSIS — I495 Sick sinus syndrome: Secondary | ICD-10-CM | POA: Diagnosis not present

## 2020-01-19 DIAGNOSIS — Z950 Presence of cardiac pacemaker: Secondary | ICD-10-CM

## 2020-01-19 DIAGNOSIS — Z952 Presence of prosthetic heart valve: Secondary | ICD-10-CM

## 2020-01-19 DIAGNOSIS — I4891 Unspecified atrial fibrillation: Secondary | ICD-10-CM | POA: Diagnosis not present

## 2020-01-19 DIAGNOSIS — Z5181 Encounter for therapeutic drug level monitoring: Secondary | ICD-10-CM

## 2020-01-19 LAB — POCT INR: INR: 3.4 — AB (ref 2.0–3.0)

## 2020-01-19 NOTE — Patient Instructions (Signed)
Continue to take 1 tablet daily except for 1.5 tablets on Mondays and Fridays. Recheck INR in 1 week.  Pt started taking amio 200mg  BID 8/4, Pacemaker insertion 8/16. [facility] [phone]

## 2020-01-20 LAB — CUP PACEART INCLINIC DEVICE CHECK
Brady Statistic RV Percent Paced: 31 %
Date Time Interrogation Session: 20210826150919
Implantable Lead Implant Date: 20210816
Implantable Lead Implant Date: 20210816
Implantable Lead Location: 753859
Implantable Lead Location: 753860
Implantable Lead Model: 7842
Implantable Lead Model: 7842
Implantable Lead Serial Number: 1013287
Implantable Lead Serial Number: 1091261
Implantable Pulse Generator Implant Date: 20210816
Lead Channel Impedance Value: 558 Ohm
Lead Channel Impedance Value: 745 Ohm
Lead Channel Pacing Threshold Amplitude: 0.6 V
Lead Channel Pacing Threshold Pulse Width: 0.4 ms
Lead Channel Sensing Intrinsic Amplitude: 8.2 mV
Lead Channel Setting Pacing Amplitude: 3.5 V
Lead Channel Setting Pacing Pulse Width: 0.4 ms
Lead Channel Setting Sensing Sensitivity: 2.5 mV
Pulse Gen Serial Number: 940432

## 2020-01-20 NOTE — Progress Notes (Signed)
Wound check appointment. Steri-strips removed. Wound without redness or edema. Incision edges approximated, wound well healed. Normal device function. RV (LBBP) thresholds, sensing, and impedance consistent with implant measurements. RA impedance increased to 745 ohms from 594 ohms at next day check, sensing not tested today due to VVIR programming. RV output programmed at 3.5V (unipolar pacing) for extra safety margin until 3 month visit. Histogram distribution appropriate for patient and level of activity. History of persistent AF, on warfarin and amiodarone. No high ventricular rates noted. Patient educated about wound care, arm mobility, lifting restrictions, and Latitude monitor. Latitude on 04/11/20 and ROV with Dr. Lovena Le on 04/25/20.

## 2020-01-25 ENCOUNTER — Other Ambulatory Visit: Payer: Self-pay

## 2020-01-25 ENCOUNTER — Ambulatory Visit (INDEPENDENT_AMBULATORY_CARE_PROVIDER_SITE_OTHER): Payer: PPO | Admitting: *Deleted

## 2020-01-25 DIAGNOSIS — I4891 Unspecified atrial fibrillation: Secondary | ICD-10-CM

## 2020-01-25 DIAGNOSIS — Z952 Presence of prosthetic heart valve: Secondary | ICD-10-CM | POA: Diagnosis not present

## 2020-01-25 DIAGNOSIS — Z5181 Encounter for therapeutic drug level monitoring: Secondary | ICD-10-CM

## 2020-01-25 LAB — POCT INR: INR: 6.1 — AB (ref 2.0–3.0)

## 2020-01-25 LAB — PROTIME-INR
INR: 5.5 (ref 0.9–1.2)
Prothrombin Time: 55 s — ABNORMAL HIGH (ref 9.1–12.0)

## 2020-01-25 NOTE — Progress Notes (Signed)
Lab called and stated that pt's INR was 5.5.

## 2020-01-25 NOTE — Patient Instructions (Signed)
Description    Called and spoke to [name], instructed for pt to hold warfarin today and tomorrow then start taking 1 tablet daily except for 1.5 tablets on Mondays.  [facility] [phone]

## 2020-01-30 DIAGNOSIS — N390 Urinary tract infection, site not specified: Secondary | ICD-10-CM | POA: Diagnosis not present

## 2020-02-01 DIAGNOSIS — H2513 Age-related nuclear cataract, bilateral: Secondary | ICD-10-CM | POA: Diagnosis not present

## 2020-02-02 ENCOUNTER — Ambulatory Visit (INDEPENDENT_AMBULATORY_CARE_PROVIDER_SITE_OTHER): Payer: PPO

## 2020-02-02 ENCOUNTER — Other Ambulatory Visit: Payer: Self-pay

## 2020-02-02 DIAGNOSIS — I4891 Unspecified atrial fibrillation: Secondary | ICD-10-CM | POA: Diagnosis not present

## 2020-02-02 DIAGNOSIS — Z952 Presence of prosthetic heart valve: Secondary | ICD-10-CM | POA: Diagnosis not present

## 2020-02-02 DIAGNOSIS — Z5181 Encounter for therapeutic drug level monitoring: Secondary | ICD-10-CM | POA: Diagnosis not present

## 2020-02-02 LAB — POCT INR: INR: 2.4 (ref 2.0–3.0)

## 2020-02-02 NOTE — Patient Instructions (Signed)
Description   Take 1.5 tablets today, then resume same dosage 1 tablet daily except for 1.5 tablets on Mondays.  Amio started 8/4. Recheck in 10 days. [facility] [phone]

## 2020-02-06 DIAGNOSIS — L03115 Cellulitis of right lower limb: Secondary | ICD-10-CM | POA: Diagnosis not present

## 2020-02-06 DIAGNOSIS — I639 Cerebral infarction, unspecified: Secondary | ICD-10-CM | POA: Diagnosis not present

## 2020-02-06 DIAGNOSIS — Z713 Dietary counseling and surveillance: Secondary | ICD-10-CM | POA: Diagnosis not present

## 2020-02-06 DIAGNOSIS — R339 Retention of urine, unspecified: Secondary | ICD-10-CM | POA: Diagnosis not present

## 2020-02-06 DIAGNOSIS — Z952 Presence of prosthetic heart valve: Secondary | ICD-10-CM | POA: Diagnosis not present

## 2020-02-06 DIAGNOSIS — D649 Anemia, unspecified: Secondary | ICD-10-CM | POA: Diagnosis not present

## 2020-02-06 DIAGNOSIS — F319 Bipolar disorder, unspecified: Secondary | ICD-10-CM | POA: Diagnosis not present

## 2020-02-06 DIAGNOSIS — Z87891 Personal history of nicotine dependence: Secondary | ICD-10-CM | POA: Diagnosis not present

## 2020-02-06 DIAGNOSIS — I10 Essential (primary) hypertension: Secondary | ICD-10-CM | POA: Diagnosis not present

## 2020-02-06 DIAGNOSIS — Z6841 Body Mass Index (BMI) 40.0 and over, adult: Secondary | ICD-10-CM | POA: Diagnosis not present

## 2020-02-06 DIAGNOSIS — I6389 Other cerebral infarction: Secondary | ICD-10-CM | POA: Diagnosis not present

## 2020-02-06 DIAGNOSIS — E872 Acidosis: Secondary | ICD-10-CM | POA: Diagnosis not present

## 2020-02-06 DIAGNOSIS — G9341 Metabolic encephalopathy: Secondary | ICD-10-CM | POA: Diagnosis not present

## 2020-02-06 DIAGNOSIS — E119 Type 2 diabetes mellitus without complications: Secondary | ICD-10-CM | POA: Diagnosis not present

## 2020-02-06 DIAGNOSIS — Z20822 Contact with and (suspected) exposure to covid-19: Secondary | ICD-10-CM | POA: Diagnosis not present

## 2020-02-06 DIAGNOSIS — I4891 Unspecified atrial fibrillation: Secondary | ICD-10-CM | POA: Diagnosis not present

## 2020-02-06 DIAGNOSIS — L0390 Cellulitis, unspecified: Secondary | ICD-10-CM | POA: Diagnosis not present

## 2020-02-06 DIAGNOSIS — J690 Pneumonitis due to inhalation of food and vomit: Secondary | ICD-10-CM | POA: Diagnosis not present

## 2020-02-06 DIAGNOSIS — R791 Abnormal coagulation profile: Secondary | ICD-10-CM | POA: Diagnosis not present

## 2020-02-06 DIAGNOSIS — S300XXA Contusion of lower back and pelvis, initial encounter: Secondary | ICD-10-CM | POA: Diagnosis not present

## 2020-02-06 DIAGNOSIS — R404 Transient alteration of awareness: Secondary | ICD-10-CM | POA: Diagnosis not present

## 2020-02-06 DIAGNOSIS — R918 Other nonspecific abnormal finding of lung field: Secondary | ICD-10-CM | POA: Diagnosis not present

## 2020-02-06 DIAGNOSIS — S81802A Unspecified open wound, left lower leg, initial encounter: Secondary | ICD-10-CM | POA: Diagnosis not present

## 2020-02-06 DIAGNOSIS — R4182 Altered mental status, unspecified: Secondary | ICD-10-CM | POA: Diagnosis not present

## 2020-02-06 DIAGNOSIS — J439 Emphysema, unspecified: Secondary | ICD-10-CM | POA: Diagnosis not present

## 2020-02-07 DIAGNOSIS — J439 Emphysema, unspecified: Secondary | ICD-10-CM | POA: Diagnosis not present

## 2020-02-08 ENCOUNTER — Telehealth: Payer: Self-pay

## 2020-02-08 NOTE — Telephone Encounter (Signed)
Kim from [place] wanted to know if the pt was MRI compatible? I told her it was. She asked what brand of ppm she has? I told her BSX. She states she is going to send a clearance form to be filled out.

## 2020-02-09 DIAGNOSIS — I6389 Other cerebral infarction: Secondary | ICD-10-CM | POA: Diagnosis not present

## 2020-02-09 DIAGNOSIS — R4182 Altered mental status, unspecified: Secondary | ICD-10-CM | POA: Diagnosis not present

## 2020-02-15 ENCOUNTER — Telehealth: Payer: Self-pay | Admitting: *Deleted

## 2020-02-15 NOTE — Telephone Encounter (Signed)
Pt overdue to have INR checked. Called and spoke to [name] who stated that pt is at the hospital at [facility] down in [place]. She stated that she would give [place] a call when pt is out of the hospital to schedule a follow up appointment.

## 2020-02-16 ENCOUNTER — Ambulatory Visit: Payer: PPO | Admitting: Interventional Cardiology

## 2020-02-17 DIAGNOSIS — R600 Localized edema: Secondary | ICD-10-CM | POA: Diagnosis not present

## 2020-02-17 DIAGNOSIS — I4891 Unspecified atrial fibrillation: Secondary | ICD-10-CM | POA: Diagnosis not present

## 2020-02-17 DIAGNOSIS — R42 Dizziness and giddiness: Secondary | ICD-10-CM | POA: Diagnosis not present

## 2020-02-17 DIAGNOSIS — D649 Anemia, unspecified: Secondary | ICD-10-CM | POA: Diagnosis not present

## 2020-02-17 DIAGNOSIS — R791 Abnormal coagulation profile: Secondary | ICD-10-CM | POA: Diagnosis not present

## 2020-02-17 DIAGNOSIS — Z79899 Other long term (current) drug therapy: Secondary | ICD-10-CM | POA: Diagnosis not present

## 2020-02-17 DIAGNOSIS — R8290 Unspecified abnormal findings in urine: Secondary | ICD-10-CM | POA: Diagnosis not present

## 2020-02-17 DIAGNOSIS — E119 Type 2 diabetes mellitus without complications: Secondary | ICD-10-CM | POA: Diagnosis not present

## 2020-02-17 DIAGNOSIS — Z7901 Long term (current) use of anticoagulants: Secondary | ICD-10-CM | POA: Diagnosis not present

## 2020-02-17 DIAGNOSIS — L0390 Cellulitis, unspecified: Secondary | ICD-10-CM | POA: Diagnosis not present

## 2020-02-17 DIAGNOSIS — D688 Other specified coagulation defects: Secondary | ICD-10-CM | POA: Diagnosis not present

## 2020-02-17 DIAGNOSIS — M7989 Other specified soft tissue disorders: Secondary | ICD-10-CM | POA: Diagnosis not present

## 2020-02-17 DIAGNOSIS — G43909 Migraine, unspecified, not intractable, without status migrainosus: Secondary | ICD-10-CM | POA: Diagnosis not present

## 2020-02-17 DIAGNOSIS — M6281 Muscle weakness (generalized): Secondary | ICD-10-CM | POA: Diagnosis not present

## 2020-02-17 DIAGNOSIS — G9341 Metabolic encephalopathy: Secondary | ICD-10-CM | POA: Diagnosis not present

## 2020-02-17 DIAGNOSIS — F319 Bipolar disorder, unspecified: Secondary | ICD-10-CM | POA: Diagnosis not present

## 2020-02-17 DIAGNOSIS — I10 Essential (primary) hypertension: Secondary | ICD-10-CM | POA: Diagnosis not present

## 2020-02-20 DIAGNOSIS — L0390 Cellulitis, unspecified: Secondary | ICD-10-CM | POA: Diagnosis not present

## 2020-02-20 DIAGNOSIS — R600 Localized edema: Secondary | ICD-10-CM | POA: Diagnosis not present

## 2020-02-20 DIAGNOSIS — R42 Dizziness and giddiness: Secondary | ICD-10-CM | POA: Diagnosis not present

## 2020-02-20 DIAGNOSIS — I10 Essential (primary) hypertension: Secondary | ICD-10-CM | POA: Diagnosis not present

## 2020-02-20 DIAGNOSIS — G9341 Metabolic encephalopathy: Secondary | ICD-10-CM | POA: Diagnosis not present

## 2020-02-21 DIAGNOSIS — F319 Bipolar disorder, unspecified: Secondary | ICD-10-CM | POA: Diagnosis not present

## 2020-02-21 DIAGNOSIS — M7989 Other specified soft tissue disorders: Secondary | ICD-10-CM | POA: Diagnosis not present

## 2020-02-23 DIAGNOSIS — E119 Type 2 diabetes mellitus without complications: Secondary | ICD-10-CM | POA: Diagnosis not present

## 2020-02-23 DIAGNOSIS — L0390 Cellulitis, unspecified: Secondary | ICD-10-CM | POA: Diagnosis not present

## 2020-02-23 DIAGNOSIS — G43909 Migraine, unspecified, not intractable, without status migrainosus: Secondary | ICD-10-CM | POA: Diagnosis not present

## 2020-02-23 DIAGNOSIS — I4891 Unspecified atrial fibrillation: Secondary | ICD-10-CM | POA: Diagnosis not present

## 2020-02-23 DIAGNOSIS — I10 Essential (primary) hypertension: Secondary | ICD-10-CM | POA: Diagnosis not present

## 2020-02-23 DIAGNOSIS — R42 Dizziness and giddiness: Secondary | ICD-10-CM | POA: Diagnosis not present

## 2020-02-24 ENCOUNTER — Ambulatory Visit: Payer: PPO | Admitting: Family

## 2020-02-24 ENCOUNTER — Other Ambulatory Visit: Payer: Self-pay

## 2020-02-24 DIAGNOSIS — R791 Abnormal coagulation profile: Secondary | ICD-10-CM | POA: Diagnosis not present

## 2020-02-24 DIAGNOSIS — F5105 Insomnia due to other mental disorder: Secondary | ICD-10-CM

## 2020-02-24 DIAGNOSIS — F3181 Bipolar II disorder: Secondary | ICD-10-CM

## 2020-02-24 MED ORDER — QUETIAPINE FUMARATE 100 MG PO TABS: ORAL_TABLET | ORAL | 1 refills | Status: DC

## 2020-02-27 DIAGNOSIS — L0390 Cellulitis, unspecified: Secondary | ICD-10-CM | POA: Diagnosis not present

## 2020-02-27 DIAGNOSIS — Z7901 Long term (current) use of anticoagulants: Secondary | ICD-10-CM | POA: Diagnosis not present

## 2020-02-27 DIAGNOSIS — R600 Localized edema: Secondary | ICD-10-CM | POA: Diagnosis not present

## 2020-02-27 DIAGNOSIS — R42 Dizziness and giddiness: Secondary | ICD-10-CM | POA: Diagnosis not present

## 2020-02-27 DIAGNOSIS — G9341 Metabolic encephalopathy: Secondary | ICD-10-CM | POA: Diagnosis not present

## 2020-02-27 DIAGNOSIS — I4891 Unspecified atrial fibrillation: Secondary | ICD-10-CM | POA: Diagnosis not present

## 2020-03-05 ENCOUNTER — Telehealth: Payer: Self-pay | Admitting: *Deleted

## 2020-03-05 DIAGNOSIS — D649 Anemia, unspecified: Secondary | ICD-10-CM | POA: Diagnosis not present

## 2020-03-05 DIAGNOSIS — I4891 Unspecified atrial fibrillation: Secondary | ICD-10-CM | POA: Diagnosis not present

## 2020-03-05 DIAGNOSIS — R791 Abnormal coagulation profile: Secondary | ICD-10-CM | POA: Diagnosis not present

## 2020-03-05 DIAGNOSIS — R8290 Unspecified abnormal findings in urine: Secondary | ICD-10-CM | POA: Diagnosis not present

## 2020-03-05 NOTE — Telephone Encounter (Signed)
Called pt since she is overdue and the last time we spoke with dtr she was still in [place] at a rehab facility; left a message for her to call back with any updates.

## 2020-03-07 DIAGNOSIS — R600 Localized edema: Secondary | ICD-10-CM | POA: Diagnosis not present

## 2020-03-07 DIAGNOSIS — I4891 Unspecified atrial fibrillation: Secondary | ICD-10-CM | POA: Diagnosis not present

## 2020-03-07 DIAGNOSIS — L0390 Cellulitis, unspecified: Secondary | ICD-10-CM | POA: Diagnosis not present

## 2020-03-07 DIAGNOSIS — D649 Anemia, unspecified: Secondary | ICD-10-CM | POA: Diagnosis not present

## 2020-03-07 DIAGNOSIS — G9341 Metabolic encephalopathy: Secondary | ICD-10-CM | POA: Diagnosis not present

## 2020-03-12 ENCOUNTER — Telehealth: Payer: Self-pay | Admitting: Internal Medicine

## 2020-03-12 NOTE — Telephone Encounter (Signed)
Called and spoken with rep at [facility] order pharmacy. Notified that patient have been seen over 1 year. She will need to be seen for any more refills.

## 2020-03-12 NOTE — Telephone Encounter (Signed)
Pharmacy called asking if patient still needed to be taking the Metformin ER 500 (2 tablets 2x daily) - please give pharmacy a call back at [phone].  Ref# [id number]

## 2020-03-13 DIAGNOSIS — E119 Type 2 diabetes mellitus without complications: Secondary | ICD-10-CM | POA: Diagnosis not present

## 2020-03-13 DIAGNOSIS — M6281 Muscle weakness (generalized): Secondary | ICD-10-CM | POA: Diagnosis not present

## 2020-03-13 DIAGNOSIS — R918 Other nonspecific abnormal finding of lung field: Secondary | ICD-10-CM | POA: Diagnosis not present

## 2020-03-13 DIAGNOSIS — F319 Bipolar disorder, unspecified: Secondary | ICD-10-CM | POA: Diagnosis not present

## 2020-03-13 DIAGNOSIS — I4891 Unspecified atrial fibrillation: Secondary | ICD-10-CM | POA: Diagnosis not present

## 2020-03-13 DIAGNOSIS — L89892 Pressure ulcer of other site, stage 2: Secondary | ICD-10-CM | POA: Diagnosis not present

## 2020-03-13 DIAGNOSIS — D649 Anemia, unspecified: Secondary | ICD-10-CM | POA: Diagnosis not present

## 2020-03-13 DIAGNOSIS — G9341 Metabolic encephalopathy: Secondary | ICD-10-CM | POA: Diagnosis not present

## 2020-03-13 DIAGNOSIS — J690 Pneumonitis due to inhalation of food and vomit: Secondary | ICD-10-CM | POA: Diagnosis not present

## 2020-03-13 DIAGNOSIS — I10 Essential (primary) hypertension: Secondary | ICD-10-CM | POA: Diagnosis not present

## 2020-03-13 DIAGNOSIS — G43909 Migraine, unspecified, not intractable, without status migrainosus: Secondary | ICD-10-CM | POA: Diagnosis not present

## 2020-03-13 DIAGNOSIS — K5900 Constipation, unspecified: Secondary | ICD-10-CM | POA: Diagnosis not present

## 2020-03-13 DIAGNOSIS — N390 Urinary tract infection, site not specified: Secondary | ICD-10-CM | POA: Diagnosis not present

## 2020-03-14 ENCOUNTER — Inpatient Hospital Stay: Payer: PPO | Admitting: Medical

## 2020-03-14 ENCOUNTER — Telehealth: Payer: Self-pay | Admitting: Family Medicine

## 2020-03-14 NOTE — Telephone Encounter (Signed)
[facility] Call back # [phone]   Per Magda Paganini with [facility] , patient d/c from hospital and referral to them for   Home health PT orders for twice a week for 3 weeks, Magda Paganini , would like to know if Dr Charlett Blake will give verbal order to start PT.  Please Advise

## 2020-03-14 NOTE — Telephone Encounter (Signed)
OK to give VO to start PT but I will need a video visit with her in the next month to get the order covered.

## 2020-03-15 ENCOUNTER — Other Ambulatory Visit: Payer: Self-pay

## 2020-03-15 NOTE — Telephone Encounter (Signed)
Pt has visit with Percell Miller on 04/04/20 this coming month would that be okay or would you need your own appointment

## 2020-03-15 NOTE — Telephone Encounter (Addendum)
I saw your picture on chart and assumed she was established with you. I am reluctant to sign having never seen her. And have concerns if she does not come in as can happen?   I could offer her appoitment maybe on Monday. I have few slots open?  Sign paperwork on Monday.

## 2020-03-15 NOTE — Telephone Encounter (Signed)
I am happy to help with paperwork but they require an order from someone that has seen her to get the referral paid for. TY

## 2020-03-15 NOTE — Telephone Encounter (Signed)
This pt is on your schedule for Hospital  F/U and her [facility] care [facility] would like to start PT. They are is requesting a order for services to begin. Would you be willing to sign off the order if I put it in since she will be doing the Face to Face with you? The order details are attached to this message from [place]

## 2020-03-15 NOTE — Telephone Encounter (Signed)
I would recommend sending paperwork over and will get Dr. Si Gaul to sign and I will cosign since I will be the one seeing her in November. But I won't sign by myself since on  review it appears that I have never seen her. But will sign with cosign.

## 2020-03-16 NOTE — Telephone Encounter (Signed)
Thanks, she just needs to have a face to fact done within 30 days to get her PT orders approved per CMS rules I offered a VV but then was told you have an in person visit with her. Thanks for seeing her and if I need to step in I can do that. TY

## 2020-03-21 ENCOUNTER — Ambulatory Visit (INDEPENDENT_AMBULATORY_CARE_PROVIDER_SITE_OTHER): Payer: PPO | Admitting: *Deleted

## 2020-03-21 ENCOUNTER — Other Ambulatory Visit: Payer: Self-pay

## 2020-03-21 DIAGNOSIS — Z5181 Encounter for therapeutic drug level monitoring: Secondary | ICD-10-CM | POA: Diagnosis not present

## 2020-03-21 DIAGNOSIS — I4891 Unspecified atrial fibrillation: Secondary | ICD-10-CM | POA: Diagnosis not present

## 2020-03-21 DIAGNOSIS — Z952 Presence of prosthetic heart valve: Secondary | ICD-10-CM | POA: Diagnosis not present

## 2020-03-21 LAB — POCT INR: INR: 8 — AB (ref 2.0–3.0)

## 2020-03-21 LAB — PROTIME-INR
INR: >10 (ref 0.9–1.2)
INR: 8.8 (ref 0.8–1.2)
Prothrombin Time: 111.6 s — ABNORMAL HIGH (ref 9.1–12.0)
Prothrombin Time: 70 seconds — ABNORMAL HIGH (ref 11.4–15.2)

## 2020-03-21 NOTE — Patient Instructions (Signed)
Description   Called and spoke to Amy, instructed pt to hold warfarin on 10/27, 10/28 , 10/29 and 10/30. Then start taking 1 tablet daily except for 1/2 a tablet on Monday and Friday (20% dose decrease).  Pt is going to go back and start eating a salad daily. Call coumadin clinic for any changes in medications or up coming procedures.

## 2020-03-21 NOTE — Progress Notes (Signed)
Pt's INR reading greater then 8. Pt sent to lab. Daughter and pt made aware that pt is at a higher risk for bleeding and to seek medical attention if she has any falls or bleeding. Informed daughter to anticipate a phone call today.   Lab called and stated her INR was greater then 10. Sent INR to the hospital who stated that the INR was 8.8.  Called and spoke to pt's daughter and gave the updated dosing instructions.   Daughter stated that the only abx the pt was taking was Macrobid while she was at the SNF and finished it on 10/18. She did state that pt was not eating a lot there and was not getting her regular green intake. Daughter also stated that while she was at the SNF pt was taking 5mg  tablets, she had to hold her warfarin some days and took 2.5 mg some days.  Pt currently has 2.5 mg tablets of warfarin on hand and 5mg  tablets of warfarin. Informed her that pt needs to only use one tablet strength and to only use the 5mg  tablets of warfarin. Daughter stated she understood and is going to start helping her mom with her medications.

## 2020-03-22 ENCOUNTER — Telehealth: Payer: Self-pay | Admitting: Internal Medicine

## 2020-03-22 NOTE — Telephone Encounter (Signed)
Please advise below message 

## 2020-03-22 NOTE — Telephone Encounter (Signed)
[name] requests to be called at ph# [phone], Reference# [id number] re: Refill RX PHARM received for metFORMIN (GLUCOPHAGE-XR) 500 MG 24 hr tablet due to Patient previously was taking Metformin immediate release.

## 2020-03-22 NOTE — Telephone Encounter (Signed)
The pt has not been seen in over a year. Please schedule the pt for a follow up and give enough refill until then.

## 2020-03-22 NOTE — Telephone Encounter (Signed)
Message left for patient to return my call to schedule appointment.

## 2020-03-23 NOTE — Telephone Encounter (Signed)
Called patient and she stated that she will have to get her daughter to call the office. Because she is the one that brings her to her appointments. Advised patient that she will need an appointment before we can refill.

## 2020-03-26 DIAGNOSIS — M6281 Muscle weakness (generalized): Secondary | ICD-10-CM | POA: Diagnosis not present

## 2020-03-26 DIAGNOSIS — F319 Bipolar disorder, unspecified: Secondary | ICD-10-CM | POA: Diagnosis not present

## 2020-03-26 DIAGNOSIS — N390 Urinary tract infection, site not specified: Secondary | ICD-10-CM | POA: Diagnosis not present

## 2020-03-26 DIAGNOSIS — G9341 Metabolic encephalopathy: Secondary | ICD-10-CM | POA: Diagnosis not present

## 2020-03-26 DIAGNOSIS — E119 Type 2 diabetes mellitus without complications: Secondary | ICD-10-CM | POA: Diagnosis not present

## 2020-03-26 DIAGNOSIS — G43909 Migraine, unspecified, not intractable, without status migrainosus: Secondary | ICD-10-CM | POA: Diagnosis not present

## 2020-03-26 DIAGNOSIS — L89892 Pressure ulcer of other site, stage 2: Secondary | ICD-10-CM | POA: Diagnosis not present

## 2020-03-26 DIAGNOSIS — D649 Anemia, unspecified: Secondary | ICD-10-CM | POA: Diagnosis not present

## 2020-03-26 DIAGNOSIS — J690 Pneumonitis due to inhalation of food and vomit: Secondary | ICD-10-CM | POA: Diagnosis not present

## 2020-03-26 DIAGNOSIS — I10 Essential (primary) hypertension: Secondary | ICD-10-CM | POA: Diagnosis not present

## 2020-03-26 DIAGNOSIS — K5900 Constipation, unspecified: Secondary | ICD-10-CM | POA: Diagnosis not present

## 2020-03-26 DIAGNOSIS — R918 Other nonspecific abnormal finding of lung field: Secondary | ICD-10-CM | POA: Diagnosis not present

## 2020-03-26 DIAGNOSIS — I4891 Unspecified atrial fibrillation: Secondary | ICD-10-CM | POA: Diagnosis not present

## 2020-03-27 ENCOUNTER — Ambulatory Visit (INDEPENDENT_AMBULATORY_CARE_PROVIDER_SITE_OTHER): Payer: PPO

## 2020-03-27 ENCOUNTER — Emergency Department (HOSPITAL_COMMUNITY): Payer: PPO

## 2020-03-27 ENCOUNTER — Inpatient Hospital Stay (HOSPITAL_COMMUNITY)
Admission: EM | Admit: 2020-03-27 | Discharge: 2020-03-31 | DRG: 689 | Disposition: A | Discharge status: Home Health | Payer: PPO | Attending: Internal Medicine | Admitting: Internal Medicine

## 2020-03-27 ENCOUNTER — Other Ambulatory Visit: Payer: Self-pay

## 2020-03-27 ENCOUNTER — Telehealth: Payer: Self-pay | Admitting: Family Medicine

## 2020-03-27 ENCOUNTER — Telehealth (INDEPENDENT_AMBULATORY_CARE_PROVIDER_SITE_OTHER): Payer: PPO | Admitting: Family

## 2020-03-27 ENCOUNTER — Ambulatory Visit: Payer: PPO | Admitting: Family

## 2020-03-27 ENCOUNTER — Encounter (HOSPITAL_COMMUNITY): Payer: Self-pay

## 2020-03-27 ENCOUNTER — Observation Stay (HOSPITAL_COMMUNITY): Payer: PPO

## 2020-03-27 ENCOUNTER — Encounter: Payer: Self-pay | Admitting: Family

## 2020-03-27 VITALS — BP 135/57 | HR 57 | Temp 98.7°F | Resp 16

## 2020-03-27 DIAGNOSIS — Z809 Family history of malignant neoplasm, unspecified: Secondary | ICD-10-CM

## 2020-03-27 DIAGNOSIS — Z9049 Acquired absence of other specified parts of digestive tract: Secondary | ICD-10-CM | POA: Diagnosis not present

## 2020-03-27 DIAGNOSIS — I5042 Chronic combined systolic (congestive) and diastolic (congestive) heart failure: Secondary | ICD-10-CM | POA: Diagnosis not present

## 2020-03-27 DIAGNOSIS — R4182 Altered mental status, unspecified: Secondary | ICD-10-CM

## 2020-03-27 DIAGNOSIS — R296 Repeated falls: Secondary | ICD-10-CM | POA: Diagnosis not present

## 2020-03-27 DIAGNOSIS — R29818 Other symptoms and signs involving the nervous system: Secondary | ICD-10-CM | POA: Diagnosis not present

## 2020-03-27 DIAGNOSIS — E1169 Type 2 diabetes mellitus with other specified complication: Secondary | ICD-10-CM | POA: Diagnosis not present

## 2020-03-27 DIAGNOSIS — R262 Difficulty in walking, not elsewhere classified: Secondary | ICD-10-CM | POA: Diagnosis not present

## 2020-03-27 DIAGNOSIS — D631 Anemia in chronic kidney disease: Secondary | ICD-10-CM | POA: Diagnosis not present

## 2020-03-27 DIAGNOSIS — R7989 Other specified abnormal findings of blood chemistry: Secondary | ICD-10-CM | POA: Diagnosis not present

## 2020-03-27 DIAGNOSIS — Z87440 Personal history of urinary (tract) infections: Secondary | ICD-10-CM

## 2020-03-27 DIAGNOSIS — R7401 Elevation of levels of liver transaminase levels: Secondary | ICD-10-CM | POA: Diagnosis present

## 2020-03-27 DIAGNOSIS — K219 Gastro-esophageal reflux disease without esophagitis: Secondary | ICD-10-CM | POA: Diagnosis present

## 2020-03-27 DIAGNOSIS — E782 Mixed hyperlipidemia: Secondary | ICD-10-CM | POA: Diagnosis present

## 2020-03-27 DIAGNOSIS — I959 Hypotension, unspecified: Secondary | ICD-10-CM | POA: Diagnosis present

## 2020-03-27 DIAGNOSIS — I110 Hypertensive heart disease with heart failure: Secondary | ICD-10-CM | POA: Diagnosis not present

## 2020-03-27 DIAGNOSIS — E1142 Type 2 diabetes mellitus with diabetic polyneuropathy: Secondary | ICD-10-CM

## 2020-03-27 DIAGNOSIS — Z87891 Personal history of nicotine dependence: Secondary | ICD-10-CM | POA: Diagnosis not present

## 2020-03-27 DIAGNOSIS — Z20822 Contact with and (suspected) exposure to covid-19: Secondary | ICD-10-CM | POA: Diagnosis not present

## 2020-03-27 DIAGNOSIS — R531 Weakness: Secondary | ICD-10-CM

## 2020-03-27 DIAGNOSIS — Y92009 Unspecified place in unspecified non-institutional (private) residence as the place of occurrence of the external cause: Secondary | ICD-10-CM

## 2020-03-27 DIAGNOSIS — Z7901 Long term (current) use of anticoagulants: Secondary | ICD-10-CM | POA: Diagnosis not present

## 2020-03-27 DIAGNOSIS — F319 Bipolar disorder, unspecified: Secondary | ICD-10-CM | POA: Diagnosis not present

## 2020-03-27 DIAGNOSIS — Z952 Presence of prosthetic heart valve: Secondary | ICD-10-CM

## 2020-03-27 DIAGNOSIS — I4892 Unspecified atrial flutter: Secondary | ICD-10-CM | POA: Diagnosis not present

## 2020-03-27 DIAGNOSIS — Z833 Family history of diabetes mellitus: Secondary | ICD-10-CM

## 2020-03-27 DIAGNOSIS — M109 Gout, unspecified: Secondary | ICD-10-CM | POA: Diagnosis not present

## 2020-03-27 DIAGNOSIS — Z8261 Family history of arthritis: Secondary | ICD-10-CM

## 2020-03-27 DIAGNOSIS — N3000 Acute cystitis without hematuria: Secondary | ICD-10-CM | POA: Diagnosis not present

## 2020-03-27 DIAGNOSIS — W19XXXA Unspecified fall, initial encounter: Secondary | ICD-10-CM

## 2020-03-27 DIAGNOSIS — I4891 Unspecified atrial fibrillation: Secondary | ICD-10-CM | POA: Diagnosis not present

## 2020-03-27 DIAGNOSIS — I4821 Permanent atrial fibrillation: Secondary | ICD-10-CM | POA: Diagnosis present

## 2020-03-27 DIAGNOSIS — E538 Deficiency of other specified B group vitamins: Secondary | ICD-10-CM | POA: Diagnosis present

## 2020-03-27 DIAGNOSIS — I639 Cerebral infarction, unspecified: Secondary | ICD-10-CM

## 2020-03-27 DIAGNOSIS — Z882 Allergy status to sulfonamides status: Secondary | ICD-10-CM

## 2020-03-27 DIAGNOSIS — D649 Anemia, unspecified: Secondary | ICD-10-CM

## 2020-03-27 DIAGNOSIS — Z8249 Family history of ischemic heart disease and other diseases of the circulatory system: Secondary | ICD-10-CM

## 2020-03-27 DIAGNOSIS — Z6832 Body mass index (BMI) 32.0-32.9, adult: Secondary | ICD-10-CM

## 2020-03-27 DIAGNOSIS — N390 Urinary tract infection, site not specified: Secondary | ICD-10-CM | POA: Diagnosis not present

## 2020-03-27 DIAGNOSIS — Z9181 History of falling: Secondary | ICD-10-CM | POA: Diagnosis not present

## 2020-03-27 DIAGNOSIS — Z85828 Personal history of other malignant neoplasm of skin: Secondary | ICD-10-CM | POA: Diagnosis not present

## 2020-03-27 DIAGNOSIS — T148XXA Other injury of unspecified body region, initial encounter: Secondary | ICD-10-CM

## 2020-03-27 DIAGNOSIS — Z818 Family history of other mental and behavioral disorders: Secondary | ICD-10-CM

## 2020-03-27 DIAGNOSIS — Z950 Presence of cardiac pacemaker: Secondary | ICD-10-CM | POA: Diagnosis not present

## 2020-03-27 DIAGNOSIS — G319 Degenerative disease of nervous system, unspecified: Secondary | ICD-10-CM | POA: Diagnosis not present

## 2020-03-27 DIAGNOSIS — Z5181 Encounter for therapeutic drug level monitoring: Secondary | ICD-10-CM | POA: Diagnosis not present

## 2020-03-27 DIAGNOSIS — N179 Acute kidney failure, unspecified: Secondary | ICD-10-CM | POA: Diagnosis not present

## 2020-03-27 DIAGNOSIS — I351 Nonrheumatic aortic (valve) insufficiency: Secondary | ICD-10-CM | POA: Diagnosis not present

## 2020-03-27 DIAGNOSIS — G459 Transient cerebral ischemic attack, unspecified: Secondary | ICD-10-CM | POA: Diagnosis not present

## 2020-03-27 DIAGNOSIS — Z7982 Long term (current) use of aspirin: Secondary | ICD-10-CM

## 2020-03-27 DIAGNOSIS — N1831 Chronic kidney disease, stage 3a: Secondary | ICD-10-CM | POA: Diagnosis not present

## 2020-03-27 DIAGNOSIS — Z96651 Presence of right artificial knee joint: Secondary | ICD-10-CM | POA: Diagnosis present

## 2020-03-27 DIAGNOSIS — S7012XA Contusion of left thigh, initial encounter: Secondary | ICD-10-CM | POA: Diagnosis present

## 2020-03-27 DIAGNOSIS — I10 Essential (primary) hypertension: Secondary | ICD-10-CM | POA: Diagnosis present

## 2020-03-27 DIAGNOSIS — M25552 Pain in left hip: Secondary | ICD-10-CM | POA: Diagnosis not present

## 2020-03-27 DIAGNOSIS — Z7984 Long term (current) use of oral hypoglycemic drugs: Secondary | ICD-10-CM

## 2020-03-27 DIAGNOSIS — Z888 Allergy status to other drugs, medicaments and biological substances status: Secondary | ICD-10-CM

## 2020-03-27 DIAGNOSIS — S7002XA Contusion of left hip, initial encounter: Secondary | ICD-10-CM | POA: Diagnosis present

## 2020-03-27 DIAGNOSIS — Z811 Family history of alcohol abuse and dependence: Secondary | ICD-10-CM

## 2020-03-27 DIAGNOSIS — G43909 Migraine, unspecified, not intractable, without status migrainosus: Secondary | ICD-10-CM | POA: Diagnosis present

## 2020-03-27 DIAGNOSIS — E669 Obesity, unspecified: Secondary | ICD-10-CM | POA: Diagnosis present

## 2020-03-27 DIAGNOSIS — R059 Cough, unspecified: Secondary | ICD-10-CM

## 2020-03-27 DIAGNOSIS — G9341 Metabolic encephalopathy: Secondary | ICD-10-CM | POA: Diagnosis not present

## 2020-03-27 DIAGNOSIS — Z79899 Other long term (current) drug therapy: Secondary | ICD-10-CM

## 2020-03-27 DIAGNOSIS — I6930 Unspecified sequelae of cerebral infarction: Secondary | ICD-10-CM

## 2020-03-27 LAB — URINALYSIS, ROUTINE W REFLEX MICROSCOPIC
Bilirubin Urine: NEGATIVE
Glucose, UA: NEGATIVE mg/dL
Hgb urine dipstick: NEGATIVE
Ketones, ur: NEGATIVE mg/dL
Nitrite: POSITIVE — AB
Protein, ur: NEGATIVE mg/dL
Specific Gravity, Urine: 1.013 (ref 1.005–1.030)
pH: 7 (ref 5.0–8.0)

## 2020-03-27 LAB — POC URINALSYSI DIPSTICK (AUTOMATED)
Bilirubin, UA: NEGATIVE
Blood, UA: NEGATIVE
Glucose, UA: NEGATIVE
Ketones, UA: NEGATIVE
Nitrite, UA: NEGATIVE
Protein, UA: NEGATIVE
Spec Grav, UA: 1.02 (ref 1.010–1.025)
Urobilinogen, UA: 0.2 E.U./dL
pH, UA: 6.5 (ref 5.0–8.0)

## 2020-03-27 LAB — COMPREHENSIVE METABOLIC PANEL
ALT: 61 U/L — ABNORMAL HIGH (ref 0–44)
AST: 68 U/L — ABNORMAL HIGH (ref 15–41)
Albumin: 3.9 g/dL (ref 3.5–5.0)
Alkaline Phosphatase: 26 U/L — ABNORMAL LOW (ref 38–126)
Anion gap: 12 (ref 5–15)
BUN: 15 mg/dL (ref 8–23)
CO2: 24 mmol/L (ref 22–32)
Calcium: 8.7 mg/dL — ABNORMAL LOW (ref 8.9–10.3)
Chloride: 103 mmol/L (ref 98–111)
Creatinine, Ser: 1.3 mg/dL — ABNORMAL HIGH (ref 0.44–1.00)
GFR, Estimated: 45 mL/min — ABNORMAL LOW (ref >60)
Glucose, Bld: 92 mg/dL (ref 70–99)
Potassium: 4 mmol/L (ref 3.5–5.1)
Sodium: 139 mmol/L (ref 135–145)
Total Bilirubin: 0.5 mg/dL (ref 0.3–1.2)
Total Protein: 6.3 g/dL — ABNORMAL LOW (ref 6.5–8.1)

## 2020-03-27 LAB — CBG MONITORING, ED: Glucose-Capillary: 110 mg/dL — ABNORMAL HIGH (ref 70–99)

## 2020-03-27 LAB — POCT INR: INR: 2.5 (ref 2.0–3.0)

## 2020-03-27 MED ORDER — WARFARIN SODIUM 2.5 MG PO TABS: 2.5000 mg | ORAL_TABLET | ORAL | Status: DC

## 2020-03-27 MED ORDER — LAMOTRIGINE 100 MG PO TABS
100.0000 mg | ORAL_TABLET | Freq: Every day | ORAL | Status: DC
  Administered 2020-03-28 – 2020-03-31 (×4): 100 mg via ORAL
  Filled 2020-03-27 (×4): qty 1

## 2020-03-27 MED ORDER — FENOFIBRATE 160 MG PO TABS
160.0000 mg | ORAL_TABLET | Freq: Every day | ORAL | Status: DC
  Administered 2020-03-28 – 2020-03-31 (×4): 160 mg via ORAL
  Filled 2020-03-27 (×4): qty 1

## 2020-03-27 MED ORDER — AMLODIPINE BESYLATE 5 MG PO TABS: 5.0000 mg | ORAL_TABLET | Freq: Every day | ORAL | Status: DC

## 2020-03-27 MED ORDER — QUETIAPINE FUMARATE ER 300 MG PO TB24
600.0000 mg | ORAL_TABLET | Freq: Every evening | ORAL | Status: DC
  Administered 2020-03-28 – 2020-03-30 (×4): 600 mg via ORAL
  Filled 2020-03-27 (×5): qty 2

## 2020-03-27 MED ORDER — WARFARIN SODIUM 5 MG PO TABS
5.0000 mg | ORAL_TABLET | ORAL | Status: DC
  Filled 2020-03-27: qty 1

## 2020-03-27 MED ORDER — ROSUVASTATIN CALCIUM 20 MG PO TABS
40.0000 mg | ORAL_TABLET | Freq: Every day | ORAL | Status: DC
  Administered 2020-03-28 – 2020-03-31 (×4): 40 mg via ORAL
  Filled 2020-03-27 (×4): qty 2

## 2020-03-27 MED ORDER — SODIUM CHLORIDE 0.9 % IV SOLN
1.0000 g | Freq: Once | INTRAVENOUS | Status: AC
  Administered 2020-03-28: 1 g via INTRAVENOUS
  Filled 2020-03-27: qty 10

## 2020-03-27 MED ORDER — SODIUM CHLORIDE 0.9 % IV SOLN
1.0000 g | INTRAVENOUS | Status: DC
  Administered 2020-03-28: 1 g via INTRAVENOUS
  Filled 2020-03-27: qty 10

## 2020-03-27 MED ORDER — LAMOTRIGINE 100 MG PO TABS
200.0000 mg | ORAL_TABLET | Freq: Every day | ORAL | Status: DC
  Administered 2020-03-28 – 2020-03-30 (×4): 200 mg via ORAL
  Filled 2020-03-27 (×5): qty 2

## 2020-03-27 MED ORDER — INSULIN ASPART 100 UNIT/ML ~~LOC~~ SOLN: 0.0000 [IU] | Freq: Every day | SUBCUTANEOUS | Status: DC

## 2020-03-27 MED ORDER — METOPROLOL SUCCINATE ER 25 MG PO TB24: 25.0000 mg | ORAL_TABLET | Freq: Every day | ORAL | Status: DC

## 2020-03-27 MED ORDER — OXCARBAZEPINE 150 MG PO TABS
150.0000 mg | ORAL_TABLET | Freq: Two times a day (BID) | ORAL | Status: DC
  Administered 2020-03-28 – 2020-03-31 (×8): 150 mg via ORAL
  Filled 2020-03-27 (×9): qty 1

## 2020-03-27 MED ORDER — AMIODARONE HCL 200 MG PO TABS
200.0000 mg | ORAL_TABLET | Freq: Two times a day (BID) | ORAL | Status: DC
  Administered 2020-03-28 – 2020-03-31 (×8): 200 mg via ORAL
  Filled 2020-03-27 (×8): qty 1

## 2020-03-27 MED ORDER — PANTOPRAZOLE SODIUM 40 MG PO TBEC: 40.0000 mg | DELAYED_RELEASE_TABLET | Freq: Every day | ORAL | Status: DC

## 2020-03-27 MED ORDER — PANTOPRAZOLE SODIUM 20 MG PO TBEC
20.0000 mg | DELAYED_RELEASE_TABLET | ORAL | Status: DC
  Administered 2020-03-28 – 2020-03-31 (×7): 20 mg via ORAL
  Filled 2020-03-27 (×9): qty 1

## 2020-03-27 MED ORDER — WARFARIN - PHARMACIST DOSING INPATIENT: Freq: Every day | Status: DC

## 2020-03-27 MED ORDER — VENLAFAXINE HCL ER 75 MG PO CP24
75.0000 mg | ORAL_CAPSULE | Freq: Every day | ORAL | Status: DC
  Administered 2020-03-28 – 2020-03-31 (×4): 75 mg via ORAL
  Filled 2020-03-27 (×5): qty 1

## 2020-03-27 MED ORDER — ASPIRIN EC 81 MG PO TBEC
81.0000 mg | DELAYED_RELEASE_TABLET | Freq: Every day | ORAL | Status: DC
  Administered 2020-03-28 – 2020-03-31 (×4): 81 mg via ORAL
  Filled 2020-03-27 (×4): qty 1

## 2020-03-27 MED ORDER — INSULIN ASPART 100 UNIT/ML ~~LOC~~ SOLN: 0.0000 [IU] | Freq: Three times a day (TID) | SUBCUTANEOUS | Status: DC

## 2020-03-27 MED ORDER — ROPINIROLE HCL 0.5 MG PO TABS: 0.5000 mg | ORAL_TABLET | Freq: Every day | ORAL | Status: DC

## 2020-03-27 NOTE — Patient Instructions (Signed)
Description    Continue on same dosage dosage 1 tablet daily except for 1/2 a tablet on Mondays and Fridays.  Recheck in 1 week. Pt is going to go back and start eating a salad daily. Call coumadin clinic for any changes in medications or up coming procedures.

## 2020-03-27 NOTE — ED Provider Notes (Addendum)
[facility] EMERGENCY DEPARTMENT Provider Note   CSN: [id number] Arrival date & time: 03/27/20  1722     History Chief Complaint  Patient presents with  . Weakness    [name] is a 69 y.o. female.  Patient is a 69 year old female with a history of diabetes, migraines, recurrent UTIs, anemia, aortic valve replacement on Coumadin and hypertension who presents today with recurrent falls over the last 24 hours, left-sided weakness and intermittent word finding difficulty. Patient's daughter gives most of the history and reports that she recently got out of rehab about 4 to 6 weeks ago and she has been living with her since that time. When she returned from rehab she has been using a walker all the time to get around but has been mentating normally. Yesterday she noticed that she was forgetting her words and not her normal self. Since yesterday she has had 4 falls even when using the walker and appears to have weakness in the left leg. Patient reports no significant pain anywhere and denies any headache but daughter states she had a migraine headache yesterday. Patient denies any visual changes. No chest pain, shortness of breath, abdominal pain, vomiting or diarrhea. Her INR was 8.8 last week and they have been adjusting the Coumadin. The level today was 2.5. She did see her doctor today because they were concerned she may have a urinary tract infection because she started to develop foul-smelling urine which she states is usually the telltale sign for an infection. The dip at the doctor's office did show small leukocytes but they were concerned there was something else going on and recommended she come to the emergency room. CBC at the doctor's office was within normal limits.  The history is provided by the patient and a caregiver.  Weakness      Past Medical History:  Diagnosis Date  . ANEMIA 05/28/2010  . AORTIC VALVE REPLACEMENT, HX OF 03/11/2010   Mechanical  prosthesis  . BIPOLAR AFFECTIVE DISORDER 03/11/2010  . Depression   . Diverticulitis 12/18/2010  . FIBROIDS, UTERUS 03/11/2010  . Gout 09/04/2016  . Grave's disease 8-12  . Hyperlipidemia associated with type 2 diabetes mellitus ([place]) 06/05/2016  . Hypertension   . Low back pain 11/02/2017  . Migraine 06/05/2016  . Mixed hyperlipidemia 03/11/2010  . Obesity   . Skin cancer   . Syncope 08/22/2019  . UTI (lower urinary tract infection) 08/14/2011    Patient Active Problem List   Diagnosis Date Noted  . Bradycardia 08/23/2019  . Prolonged QT interval   . Near syncope 08/21/2019  . GAD (generalized anxiety disorder) 04/25/2018  . Ankle pain 02/04/2018  . Nocturia 11/02/2017  . Low back pain 11/02/2017  . Type 2 diabetes mellitus with diabetic polyneuropathy, without long-term current use of insulin ([place]) 11/02/2017  . Hyperlipidemia associated with type 2 diabetes mellitus ([place]) 06/05/2016  . Migraine 06/05/2016  . Encounter for therapeutic drug monitoring 09/13/2013  . Essential hypertension, benign 04/05/2013  . Atrial fibrillation ([place]) 03/18/2013  . Long term current use of anticoagulant therapy 03/11/2013  . UTI (urinary tract infection) 02/10/2013  . Pernicious anemia 02/21/2011  . Renal insufficiency 12/30/2010  . Hyperthyroidism 12/18/2010  . Bipolar disorder ([place]) 03/11/2010  . DIVERTICULOSIS OF COLON 03/11/2010  . Constipation 03/11/2010  . Hx of mechanical aortic valve replacement 03/11/2010    Past Surgical History:  Procedure Laterality Date  . ABDOMINAL HYSTERECTOMY  ?1998   sb/l spo, total for fibroids/heavy bleeding  .  ANKLE SURGERY Left 01/2018   hardware removal  . AORTIC VALVE REPLACEMENT  2011   Mechanical prosthesis, [employer]  . APPENDECTOMY     Required revision for EColi infection, required recurrent packing  . bowel obstruction     Requiring adhesions to be removed  . CHOLECYSTECTOMY    . COLONOSCOPY WITH PROPOFOL N/A 11/01/2015   Procedure:  COLONOSCOPY WITH PROPOFOL;  Surgeon: Milus Banister, MD;  Location: [facility];  Service: Endoscopy;  Laterality: N/A;  . CYSTOCELE REPAIR N/A 10/16/2015   Procedure: ANTERIOR REPAIR (CYSTOCELE);  Surgeon: Donnamae Jude, MD;  Location: [facility];  Service: Gynecology;  Laterality: N/A;  . HERNIA REPAIR  08-16-10  . LEFT HEART CATHETERIZATION WITH CORONARY ANGIOGRAM N/A 12/05/2011   Procedure: LEFT HEART CATHETERIZATION WITH CORONARY ANGIOGRAM;  Surgeon: Sinclair Grooms, MD;  Location: [facility] CATH LAB;  Service: Cardiovascular;  Laterality: N/A;  . PACEMAKER IMPLANT N/A 01/09/2020   Procedure: PACEMAKER IMPLANT;  Surgeon: Evans Lance, MD;  Location: [facility] CV LAB;  Service: Cardiovascular;  Laterality: N/A;  . TOTAL KNEE ARTHROPLASTY Right 02/24/2013   Procedure: RIGHT TOTAL KNEE ARTHROPLASTY;  Surgeon: Johnn Hai, MD;  Location: [facility];  Service: Orthopedics;  Laterality: Right;  . TUBAL LIGATION    . varicose vein surgery b/l legs       OB History    Gravida  2   Para  2   Term  2   Preterm      AB      Living  2     SAB      TAB      Ectopic      Multiple      Live Births              Family History  Problem Relation Age of Onset  . Scoliosis Mother   . Arthritis Mother        Rheumatoid  . Aneurysm Mother        heart  . Alcohol abuse Father   . Depression Sister   . Depression Brother   . Alcohol abuse Brother   . Cancer Maternal Grandmother        colon  . Diabetes Maternal Grandfather   . Heart disease Maternal Grandfather   . Alcohol abuse Maternal Grandfather   . Depression Paternal Grandmother   . Diabetes Paternal Grandmother   . Depression Paternal Grandfather   . Diabetes Paternal Grandfather   . Depression Sister   . Alcohol abuse Brother   . Depression Brother   . Heart disease Brother     Social History   Tobacco Use  . Smoking status: Former Smoker    Quit date: 05/26/1990    Years since quitting: 29.8  . Smokeless  tobacco: Never Used  Vaping Use  . Vaping Use: Never used  Substance Use Topics  . Alcohol use: Yes    Alcohol/week: 0.0 standard drinks    Comment: rare  . Drug use: No    Home Medications Prior to Admission medications   Medication Sig Start Date End Date Taking? Authorizing Provider  acetaminophen (TYLENOL) 650 MG CR tablet Take 1,300 mg by mouth every 8 (eight) hours as needed for pain.   Yes [provider]  amiodarone (PACERONE) 200 MG tablet TAKE 1 TABLET BY MOUTH TWICE A DAY Patient taking differently: Take 200 mg by mouth 2 (two) times daily.  01/19/20  Yes Evans Lance, MD  amLODipine (NORVASC) 5 MG tablet Take 1 tablet (5 mg total) by mouth daily. 01/05/20  Yes Belva Crome, MD  aspirin EC 81 MG tablet Take 81 mg by mouth daily.   Yes [provider]  butalbital-acetaminophen-caffeine (FIORICET) 50-325-40 MG tablet Take 1 tablet by mouth as needed for migraine. 03/08/20  Yes [provider]  fenofibrate micronized (ANTARA) 130 MG capsule Take 1 capsule (130 mg total) by mouth daily before breakfast. 03/19/18  Yes Mosie Lukes, MD  furosemide (LASIX) 40 MG tablet Take 1 tablet (40 mg total) by mouth daily. 10/13/19  Yes Belva Crome, MD  glucose blood [facility] VERIO) test strip Check blood sugars once daily Dx: E11.9 10/12/18  Yes Mosie Lukes, MD  lamoTRIgine (LAMICTAL) 100 MG tablet Take 100 mg by mouth in the morning and 200 mg by mouth at bedtime Patient taking differently: Take 100-200 mg by mouth See admin instructions. Take 100 mg by mouth in the morning and 200 mg by mouth at bedtime 10/11/19  Yes Thayer Headings, PMHNP  Lancets [facility] ULTRASOFT) lancets Use as instructed to test blood sugars once daily Dx. E11.65 08/18/18  Yes Shamleffer, Melanie Crazier, MD  metFORMIN (GLUCOPHAGE-XR) 500 MG 24 hr tablet Take 500 mg by mouth 2 (two) times daily.   Yes [provider]  metoprolol succinate (TOPROL XL) 25 MG 24 hr tablet Take 1  tablet (25 mg total) by mouth daily. 01/06/20  Yes Belva Crome, MD  omeprazole (PRILOSEC OTC) 20 MG tablet Take 20 mg by mouth in the morning and at bedtime.   Yes [provider]  ONE TOUCH LANCETS MISC Test once daily to check blood sugar. DX E11.9 04/26/15  Yes Mosie Lukes, MD  OXcarbazepine (TRILEPTAL) 150 MG tablet Take 1 tablet (150 mg total) by mouth 2 (two) times daily. 10/11/19 03/27/20 Yes Thayer Headings, PMHNP  pantoprazole (PROTONIX) 40 MG tablet Take 1 tablet (40 mg total) by mouth daily. 08/27/18  Yes Mosie Lukes, MD  QUEtiapine (SEROQUEL XR) 300 MG 24 hr tablet Take 2 tablets (600 mg total) by mouth every evening. 10/11/19 03/27/20 Yes Thayer Headings, PMHNP  rOPINIRole (REQUIP) 0.25 MG tablet Take 1-2 tablets (0.25-0.5 mg total) by mouth at bedtime. Patient taking differently: Take 0.5 mg by mouth at bedtime.  09/02/19  Yes Mosie Lukes, MD  rosuvastatin (CRESTOR) 40 MG tablet Take 1 tablet (40 mg total) by mouth daily. 06/21/19  Yes Mosie Lukes, MD  SUMAtriptan (IMITREX) 50 MG tablet TAKE ONE TABLET EVERY 2 HOURS AS NEEDED FOR MIGRAINE. MAY REPEAT IN 2 HOURS IF HEADACHE PERSISTS OR RECURS. (MAX OF 2 TABLETS IN 24 HOURS) Patient taking differently: Take 50 mg by mouth every 2 (two) hours as needed for migraine.  12/01/17  Yes Mosie Lukes, MD  venlafaxine XR (EFFEXOR XR) 75 MG 24 hr capsule Take 1 capsule (75 mg total) by mouth daily with breakfast. 10/11/19 03/27/20 Yes Thayer Headings, PMHNP  warfarin (COUMADIN) 5 MG tablet Take as directed by [facility]. Patient taking differently: Take 2.5-5 mg by mouth See admin instructions. Take 2.5mg  on Mondays and Fridays, and 5mg  on all other days.Take every night. 09/15/19  Yes Belva Crome, MD  QUEtiapine (SEROQUEL) 100 MG tablet Take 1/2-1 tab po QHS Patient not taking: Reported on 03/27/2020 02/24/20   Thayer Headings, PMHNP  methimazole (TAPAZOLE) 5 MG tablet Take 1 tablet (5 mg total) by mouth 3 (three) times daily.  06/24/11 08/31/19  Loanne Drilling,  Hilliard Clark, MD    Allergies    Mucinex [guaifenesin er], Keflex [cephalexin], Sulfa antibiotics, and Sulfonamide derivatives  Review of Systems   Review of Systems  Neurological: Positive for weakness.  All other systems reviewed and are negative.   Physical Exam Updated Vital Signs BP 130/79   Pulse 60   Temp 98.8 F (37.1 C) (Oral)   Resp 12   Ht 5\' 8"  (1.727 m)   Wt 97.5 kg   SpO2 99%   BMI 32.69 kg/m   Physical Exam Vitals and nursing note reviewed.  Constitutional:      General: She is not in acute distress.    Appearance: Normal appearance. She is well-developed. She is obese.  HENT:     Head: Normocephalic and atraumatic.     Mouth/Throat:     Mouth: Mucous membranes are moist.  Eyes:     Pupils: Pupils are equal, round, and reactive to light.  Cardiovascular:     Rate and Rhythm: Normal rate and regular rhythm.     Pulses: Normal pulses.     Heart sounds: Normal heart sounds. No murmur heard.  No friction rub.  Pulmonary:     Effort: Pulmonary effort is normal.     Breath sounds: Normal breath sounds. No wheezing or rales.  Chest:     Chest wall: No tenderness.  Abdominal:     General: Bowel sounds are normal. There is no distension.     Palpations: Abdomen is soft.     Tenderness: There is no abdominal tenderness. There is no guarding or rebound.  Musculoskeletal:        General: Normal range of motion.     Cervical back: Normal range of motion and neck supple. No tenderness.     Comments: No edema. Large hematoma with some tenderness palpated over the left hip and proximal femur. Full range of motion at the left hip. Ecchymosis scattered over the arms and legs bilaterally in various stages of healing.  Skin:    General: Skin is warm and dry.     Findings: No rash.  Neurological:     Mental Status: She is alert and oriented to person, place, and time.     Cranial Nerves: No cranial nerve deficit.     Comments: Occasionally will  stop during conversation and reports she forgot what she was going to say. No specific aphasia at this time. Patient does have 4-5 strength in the left lower extremity with mild pronator drift. Right lower extremity with 5 out of 5 strength. Right upper extremity with 5 out of 5 strength. Left upper extremity with 5 out of 5 strength but mild pronator drift. No visual field cuts. Normal sensation.  Psychiatric:        Mood and Affect: Mood normal.        Behavior: Behavior normal.        Thought Content: Thought content normal.      ED Results / Procedures / Treatments   Labs (all labs ordered are listed, but only abnormal results are displayed) Labs Reviewed  COMPREHENSIVE METABOLIC PANEL - Abnormal; Notable for the following components:      Result Value   Creatinine, Ser 1.30 (*)    Calcium 8.7 (*)    Total Protein 6.3 (*)    AST 68 (*)    ALT 61 (*)    Alkaline Phosphatase 26 (*)    GFR, Estimated 45 (*)    All other components within normal limits  URINALYSIS, ROUTINE W REFLEX MICROSCOPIC - Abnormal; Notable for the following components:   APPearance HAZY (*)    Nitrite POSITIVE (*)    Leukocytes,Ua MODERATE (*)    Bacteria, UA RARE (*)    All other components within normal limits  CBG MONITORING, ED - Abnormal; Notable for the following components:   Glucose-Capillary 110 (*)    All other components within normal limits  RESPIRATORY PANEL BY RT PCR (FLU A&B, COVID)    EKG EKG Interpretation  Date/Time:  Tuesday March 27 2020 17:30:39 EDT Ventricular Rate:  67 PR Interval:    QRS Duration: 132 QT Interval:  439 QTC Calculation: 464 R Axis:   49 Text Interpretation: Atrial fibrillation Left bundle branch block , new ST depression in inferior leads Confirmed by Blanchie Dessert [phone]) on 03/27/2020 5:33:36 PM   Radiology CT Head Wo Contrast  Result Date: 03/27/2020 CLINICAL DATA:  Neuro deficit, acute, stroke suspected Weakness. EXAM: CT HEAD WITHOUT CONTRAST  TECHNIQUE: Contiguous axial images were obtained from the base of the skull through the vertex without intravenous contrast. COMPARISON:  Head CT 08/21/2019 FINDINGS: Brain: Stable degree of atrophy and chronic small vessel ischemia. No intracranial hemorrhage, mass effect, or midline shift. No hydrocephalus. The basilar cisterns are patent. No evidence of territorial infarct or acute ischemia. No extra-axial or intracranial fluid collection. Vascular: Atherosclerosis of skullbase vasculature without hyperdense vessel or abnormal calcification. Skull: No fracture or focal lesion. Sinuses/Orbits: Paranasal sinuses and mastoid air cells are clear. The visualized orbits are unremarkable. Other: None. IMPRESSION: 1. No acute intracranial abnormality. 2. Stable atrophy and chronic small vessel ischemia. Electronically Signed   By: Keith Rake M.D.   On: 03/27/2020 18:45   DG HIP UNILAT WITH PELVIS 2-3 VIEWS LEFT  Result Date: 03/27/2020 CLINICAL DATA:  Acute left hip pain after multiple falls. EXAM: DG HIP (WITH OR WITHOUT PELVIS) 2-3V LEFT COMPARISON:  None. FINDINGS: There is no evidence of hip fracture or dislocation. There is no evidence of arthropathy or other focal bone abnormality. IMPRESSION: Negative. Electronically Signed   By: Marijo Conception M.D.   On: 03/27/2020 18:28    Procedures Procedures (including critical care time)  Medications Ordered in ED Medications - No data to display  ED Course  I have reviewed the triage vital signs and the nursing notes.  Pertinent labs & imaging results that were available during my care of the patient were reviewed by me and considered in my medical decision making (see chart for details).    MDM Rules/Calculators/A&P                          Elderly female presenting today with her daughter for 24 hours of change in behavior. She is having some word finding difficulty, weakness on the left side and 4 falls in the last 24 hours even when using  her walker. Saw PCP today and had blood work and urine. Urine showed small leukocytes but CBC and INR were unremarkable. CMP was pending. Patient is hemodynamically stable here but does have notable weakness on the left side. Concern for possible stroke or bleed. She did have a headache yesterday but no headaches currently. She is mentating normally at this time. Does not appear to be responding to internal stimuli. No evidence of delirium. Low suspicion for fracture but given large hematoma we will do an x-ray of the left hip to ensure no other acute findings. Head CT was negative  for acute bleed but will do an MRI to evaluate for stroke. Also will get CMP to ensure no electrolyte abnormalities as well as full microscopic urine. CMP is reassuring, UA with concern for infection with positive leukocytes positive nitrites, 21-50 white cells and rare bacteria.  Looking back patient has been's pansensitive to antibiotics from both E. coli and Klebsiella over the past few years.  Possibility that patient is having a UTI causing her symptoms however also concerned that there could be a stroke.  Patient is unable to receive an MRI tonight because they have to have the day pacemaker tech available.  Will give IV Rocephin.  Admit for recurrent falls and left-sided weakness to get an MRI in the morning.  Dr. Cheral Marker to consult on the pt.  MDM Number of Diagnoses or Management Options   Amount and/or Complexity of Data Reviewed Clinical lab tests: ordered and reviewed Tests in the radiology section of CPT: ordered and reviewed Tests in the medicine section of CPT: ordered and reviewed Decide to obtain previous medical records or to obtain history from someone other than the patient: yes Obtain history from someone other than the patient: yes Review and summarize past medical records: yes Discuss the patient with other providers: yes Independent visualization of images, tracings, or specimens: yes  Risk of  Complications, Morbidity, and/or Mortality Presenting problems: moderate Diagnostic procedures: low Management options: low  Patient Progress Patient progress: stable   Final Clinical Impression(s) / ED Diagnoses Final diagnoses:  Acute cystitis without hematuria  Left-sided weakness    Rx / DC Orders ED Discharge Orders    None       Blanchie Dessert, MD 03/27/20 2105    Blanchie Dessert, MD 03/27/20 2233

## 2020-03-27 NOTE — H&P (Addendum)
History and Physical    [name] YQM:[id number] DOB: 06-30-1950 DOA: 03/27/2020  PCP: Mosie Lukes, MD Patient coming from: Home  Chief Complaint: Weakness, falls  HPI: [name] is a 69 y.o. female with medical history significant of aortic insufficiency status post mechanical valve replacement on Coumadin, atrial fibrillation/flutter status post PPM, chronic combined systolic and diastolic CHF, noninsulin-dependent type II diabetes, migraine headaches, anemia, recurrent UTIs, hypertension, hyperlipidemia, gout, depression, bipolar disorder presented to ED with complaint of left-sided weakness, word finding difficulty, and multiple falls in the past 24 hours.  Family reported that patient was discharged from rehab about 4 to 6 weeks ago.  Since she has returned from rehab she has been using a walker to ambulate and has been mentating normally.  Yesterday family noticed that she was forgetting her words and not her normal self.  Since yesterday she has had 4 falls even when using her walker and appears to have weakness in her left leg.  Patient was seen by her doctor today as she has developed foul-smelling urine.  Dipstick urinalysis done in her doctor's office did show small leukocytes but they were concerned that something else is going on and recommended patient coming into the emergency room.  Labs done at her PCPs office showing WBC 9.0, hemoglobin 10.4, hematocrit 32.5, platelet 264k.  Her INR was previously supratherapeutic and dose of Coumadin was adjusted.  Repeat INR done at her PCPs office today was 2.5.  Patient states she has had multiple falls over the past few months.  Also reports word finding difficulty since summer.  States for the past few days she has felt weakness in her left arm and leg.  In the past 24 hours, she has had several falls.  States she has injured her left hip but has not had any other significant injuries from these falls.  Denies any head injury or loss of  consciousness.  States her urine has been foul-smelling which is what happens whenever she gets a UTI.  Denies fevers, cough, shortness of breath, chest pain, nausea, vomiting, abdominal pain, or diarrhea.  ED Course: Afebrile and hemodynamically stable.  CBG 110.  Sodium 139, potassium 4.0, chloride 103, bicarb 24, BUN 15, creatinine 1.3, glucose 92.  AST 68, ALT 61, alk phos 26, T bili 0.5.  UA with positive nitrite, moderate amount of leukocytes, 21-50 WBCs, and rare bacteria.  Patient complained of left hip pain and x-ray was done which was negative for fracture or dislocation.  Head CT negative for acute intracranial abnormality. Brain MRI ordered but cannot be done until the morning when the pacemaker technician is available.  Screening SARS-CoV-2 PCR test and influenza panel pending.  Ceftriaxone ordered.  Review of Systems:  All systems reviewed and apart from history of presenting illness, are negative.  Past Medical History:  Diagnosis Date  . ANEMIA 05/28/2010  . AORTIC VALVE REPLACEMENT, HX OF 03/11/2010   Mechanical prosthesis  . BIPOLAR AFFECTIVE DISORDER 03/11/2010  . Depression   . Diverticulitis 12/18/2010  . FIBROIDS, UTERUS 03/11/2010  . Gout 09/04/2016  . Grave's disease 8-12  . Hyperlipidemia associated with type 2 diabetes mellitus ([place]) 06/05/2016  . Hypertension   . Low back pain 11/02/2017  . Migraine 06/05/2016  . Mixed hyperlipidemia 03/11/2010  . Obesity   . Skin cancer   . Syncope 08/22/2019  . UTI (lower urinary tract infection) 08/14/2011    Past Surgical History:  Procedure Laterality Date  . ABDOMINAL HYSTERECTOMY  ?  1998   sb/l spo, total for fibroids/heavy bleeding  . ANKLE SURGERY Left 01/2018   hardware removal  . AORTIC VALVE REPLACEMENT  2011   Mechanical prosthesis, [employer]  . APPENDECTOMY     Required revision for EColi infection, required recurrent packing  . bowel obstruction     Requiring adhesions to be removed  . CHOLECYSTECTOMY    .  COLONOSCOPY WITH PROPOFOL N/A 11/01/2015   Procedure: COLONOSCOPY WITH PROPOFOL;  Surgeon: Milus Banister, MD;  Location: [facility];  Service: Endoscopy;  Laterality: N/A;  . CYSTOCELE REPAIR N/A 10/16/2015   Procedure: ANTERIOR REPAIR (CYSTOCELE);  Surgeon: Donnamae Jude, MD;  Location: [place] [facility];  Service: Gynecology;  Laterality: N/A;  . HERNIA REPAIR  08-16-10  . LEFT HEART CATHETERIZATION WITH CORONARY ANGIOGRAM N/A 12/05/2011   Procedure: LEFT HEART CATHETERIZATION WITH CORONARY ANGIOGRAM;  Surgeon: Sinclair Grooms, MD;  Location: [facility] CATH LAB;  Service: Cardiovascular;  Laterality: N/A;  . PACEMAKER IMPLANT N/A 01/09/2020   Procedure: PACEMAKER IMPLANT;  Surgeon: Evans Lance, MD;  Location: [place] CV [facility];  Service: Cardiovascular;  Laterality: N/A;  . TOTAL KNEE ARTHROPLASTY Right 02/24/2013   Procedure: RIGHT TOTAL KNEE ARTHROPLASTY;  Surgeon: Johnn Hai, MD;  Location: [facility];  Service: Orthopedics;  Laterality: Right;  . TUBAL LIGATION    . varicose vein surgery b/l legs       reports that she quit smoking about 29 years ago. She has never used smokeless tobacco. She reports current alcohol use. She reports that she does not use drugs.  Allergies  Allergen Reactions  . Mucinex [Guaifenesin Er] Other (See Comments)    Severe headaches   . Keflex [Cephalexin] Other (See Comments)    Headaches and dizziness  . Sulfa Antibiotics Other (See Comments)    headaches  . Sulfonamide Derivatives Other (See Comments)    Headaches     Family History  Problem Relation Age of Onset  . Scoliosis Mother   . Arthritis Mother        Rheumatoid  . Aneurysm Mother        heart  . Alcohol abuse Father   . Depression Sister   . Depression Brother   . Alcohol abuse Brother   . Cancer Maternal Grandmother        colon  . Diabetes Maternal Grandfather   . Heart disease Maternal Grandfather   . Alcohol abuse Maternal Grandfather   . Depression Paternal Grandmother   . Diabetes  Paternal Grandmother   . Depression Paternal Grandfather   . Diabetes Paternal Grandfather   . Depression Sister   . Alcohol abuse Brother   . Depression Brother   . Heart disease Brother     Prior to Admission medications   Medication Sig Start Date End Date Taking? Authorizing Provider  acetaminophen (TYLENOL) 650 MG CR tablet Take 1,300 mg by mouth every 8 (eight) hours as needed for pain.   Yes [provider]  amiodarone (PACERONE) 200 MG tablet TAKE 1 TABLET BY MOUTH TWICE A DAY Patient taking differently: Take 200 mg by mouth 2 (two) times daily.  01/19/20  Yes Evans Lance, MD  amLODipine (NORVASC) 5 MG tablet Take 1 tablet (5 mg total) by mouth daily. 01/05/20  Yes Belva Crome, MD  aspirin EC 81 MG tablet Take 81 mg by mouth daily.   Yes [provider]  butalbital-acetaminophen-caffeine (FIORICET) 50-325-40 MG tablet Take 1 tablet by mouth as needed  for migraine. 03/08/20  Yes [provider]  fenofibrate micronized (ANTARA) 130 MG capsule Take 1 capsule (130 mg total) by mouth daily before breakfast. 03/19/18  Yes Mosie Lukes, MD  furosemide (LASIX) 40 MG tablet Take 1 tablet (40 mg total) by mouth daily. 10/13/19  Yes Belva Crome, MD  glucose blood [facility] VERIO) test strip Check blood sugars once daily Dx: E11.9 10/12/18  Yes Mosie Lukes, MD  lamoTRIgine (LAMICTAL) 100 MG tablet Take 100 mg by mouth in the morning and 200 mg by mouth at bedtime Patient taking differently: Take 100-200 mg by mouth See admin instructions. Take 100 mg by mouth in the morning and 200 mg by mouth at bedtime 10/11/19  Yes Thayer Headings, PMHNP  Lancets [facility] ULTRASOFT) lancets Use as instructed to test blood sugars once daily Dx. E11.65 08/18/18  Yes Shamleffer, Melanie Crazier, MD  metFORMIN (GLUCOPHAGE-XR) 500 MG 24 hr tablet Take 500 mg by mouth 2 (two) times daily.   Yes [provider]  metoprolol succinate (TOPROL XL) 25 MG 24 hr tablet Take 1  tablet (25 mg total) by mouth daily. 01/06/20  Yes Belva Crome, MD  omeprazole (PRILOSEC OTC) 20 MG tablet Take 20 mg by mouth in the morning and at bedtime.   Yes [provider]  ONE TOUCH LANCETS MISC Test once daily to check blood sugar. DX E11.9 04/26/15  Yes Mosie Lukes, MD  OXcarbazepine (TRILEPTAL) 150 MG tablet Take 1 tablet (150 mg total) by mouth 2 (two) times daily. 10/11/19 03/27/20 Yes Thayer Headings, PMHNP  pantoprazole (PROTONIX) 40 MG tablet Take 1 tablet (40 mg total) by mouth daily. 08/27/18  Yes Mosie Lukes, MD  QUEtiapine (SEROQUEL XR) 300 MG 24 hr tablet Take 2 tablets (600 mg total) by mouth every evening. 10/11/19 03/27/20 Yes Thayer Headings, PMHNP  rOPINIRole (REQUIP) 0.25 MG tablet Take 1-2 tablets (0.25-0.5 mg total) by mouth at bedtime. Patient taking differently: Take 0.5 mg by mouth at bedtime.  09/02/19  Yes Mosie Lukes, MD  rosuvastatin (CRESTOR) 40 MG tablet Take 1 tablet (40 mg total) by mouth daily. 06/21/19  Yes Mosie Lukes, MD  SUMAtriptan (IMITREX) 50 MG tablet TAKE ONE TABLET EVERY 2 HOURS AS NEEDED FOR MIGRAINE. MAY REPEAT IN 2 HOURS IF HEADACHE PERSISTS OR RECURS. (MAX OF 2 TABLETS IN 24 HOURS) Patient taking differently: Take 50 mg by mouth every 2 (two) hours as needed for migraine.  12/01/17  Yes Mosie Lukes, MD  venlafaxine XR (EFFEXOR XR) 75 MG 24 hr capsule Take 1 capsule (75 mg total) by mouth daily with breakfast. 10/11/19 03/27/20 Yes Thayer Headings, PMHNP  warfarin (COUMADIN) 5 MG tablet Take as directed by [facility]. Patient taking differently: Take 2.5-5 mg by mouth See admin instructions. Take 2.5mg  on Mondays and Fridays, and 5mg  on all other days.Take every night. 09/15/19  Yes Belva Crome, MD  QUEtiapine (SEROQUEL) 100 MG tablet Take 1/2-1 tab po QHS Patient not taking: Reported on 03/27/2020 02/24/20   Thayer Headings, PMHNP  methimazole (TAPAZOLE) 5 MG tablet Take 1 tablet (5 mg total) by mouth 3 (three) times daily.  06/24/11 08/31/19  Renato Shin, MD    Physical Exam: Vitals:   03/27/20 1945 03/27/20 2000 03/27/20 2015 03/27/20 2200  BP: (!) 149/69 (!) 142/93 (!) 158/88 (!) 129/51  Pulse: (!) 59 (!) 59 64 (!) 59  Resp: 16 17 14 14   Temp:      TempSrc:  SpO2: 99% 100% 100% 97%  Weight:      Height:        Physical Exam Constitutional:      General: She is not in acute distress. HENT:     Head: Normocephalic and atraumatic.  Eyes:     Extraocular Movements: Extraocular movements intact.     Conjunctiva/sclera: Conjunctivae normal.  Cardiovascular:     Rate and Rhythm: Normal rate and regular rhythm.     Pulses: Normal pulses.  Pulmonary:     Effort: Pulmonary effort is normal. No respiratory distress.     Breath sounds: Normal breath sounds. No wheezing or rales.  Abdominal:     General: Bowel sounds are normal. There is no distension.     Palpations: Abdomen is soft.     Tenderness: There is no abdominal tenderness.  Musculoskeletal:     Cervical back: Normal range of motion and neck supple.     Comments: Mild edema around the left ankle which according to the patient is chronic due to prior history of ankle surgery. Large hematoma noted in left hip region.  Skin:    General: Skin is warm and dry.  Neurological:     Mental Status: She is alert and oriented to person, place, and time.     Comments: Speech fluent Tongue deviates to the right No facial droop Slight weakness of the left upper and lower extremities Slight left pronator drift No sensory deficit     Labs on Admission: I have personally reviewed following labs and imaging studies  CBC: Recent Labs  Lab 03/27/20 1155  WBC 9.0  HGB 10.4*  HCT 32.5*  MCV 93.7  PLT 419   Basic Metabolic Panel: Recent Labs  Lab 03/27/20 1757  NA 139  K 4.0  CL 103  CO2 24  GLUCOSE 92  BUN 15  CREATININE 1.30*  CALCIUM 8.7*   GFR: Estimated Creatinine Clearance: 49.8 mL/min (A) (by C-G formula based on SCr of 1.3  mg/dL (H)). Liver Function Tests: Recent Labs  Lab 03/27/20 1757  AST 68*  ALT 61*  ALKPHOS 26*  BILITOT 0.5  PROT 6.3*  ALBUMIN 3.9   No results for input(s): LIPASE, AMYLASE in the last 168 hours. No results for input(s): AMMONIA in the last 168 hours. Coagulation Profile: Recent Labs  Lab 03/21/20 1210 03/21/20 1216 03/21/20 1346 03/27/20 1434  INR 8.0* >10.0* 8.8* 2.5   Cardiac Enzymes: No results for input(s): CKTOTAL, CKMB, CKMBINDEX, TROPONINI in the last 168 hours. BNP (last 3 results) No results for input(s): PROBNP in the last 8760 hours. HbA1C: No results for input(s): HGBA1C in the last 72 hours. CBG: Recent Labs  Lab 03/27/20 1727  GLUCAP 110*   Lipid Profile: No results for input(s): CHOL, HDL, LDLCALC, TRIG, CHOLHDL, LDLDIRECT in the last 72 hours. Thyroid Function Tests: No results for input(s): TSH, T4TOTAL, FREET4, T3FREE, THYROIDAB in the last 72 hours. Anemia Panel: No results for input(s): VITAMINB12, FOLATE, FERRITIN, TIBC, IRON, RETICCTPCT in the last 72 hours. Urine analysis:    Component Value Date/Time   COLORURINE YELLOW 03/27/2020 2011   APPEARANCEUR HAZY (A) 03/27/2020 2011   LABSPEC 1.013 03/27/2020 2011   [place] 7.0 03/27/2020 2011   GLUCOSEU NEGATIVE 03/27/2020 2011   GLUCOSEU NEGATIVE 11/02/2017 1428   [place] 03/27/2020 2011   BILIRUBINUR NEGATIVE 03/27/2020 2011   BILIRUBINUR Negative 03/27/2020 [place] 03/27/2020 2011   PROTEINUR NEGATIVE 03/27/2020 2011   UROBILINOGEN 0.2 03/27/2020 1214  UROBILINOGEN 0.2 11/02/2017 1428   NITRITE POSITIVE (A) 03/27/2020 2011   LEUKOCYTESUR MODERATE (A) 03/27/2020 2011    Radiological Exams on Admission: CT Head Wo Contrast  Result Date: 03/27/2020 CLINICAL DATA:  Neuro deficit, acute, stroke suspected Weakness. EXAM: CT HEAD WITHOUT CONTRAST TECHNIQUE: Contiguous axial images were obtained from the base of the skull through the vertex without intravenous  contrast. COMPARISON:  Head CT 08/21/2019 FINDINGS: Brain: Stable degree of atrophy and chronic small vessel ischemia. No intracranial hemorrhage, mass effect, or midline shift. No hydrocephalus. The basilar cisterns are patent. No evidence of territorial infarct or acute ischemia. No extra-axial or intracranial fluid collection. Vascular: Atherosclerosis of skullbase vasculature without hyperdense vessel or abnormal calcification. Skull: No fracture or focal lesion. Sinuses/Orbits: Paranasal sinuses and mastoid air cells are clear. The visualized orbits are unremarkable. Other: None. IMPRESSION: 1. No acute intracranial abnormality. 2. Stable atrophy and chronic small vessel ischemia. Electronically Signed   By: Keith Rake M.D.   On: 03/27/2020 18:45   DG HIP UNILAT WITH PELVIS 2-3 VIEWS LEFT  Result Date: 03/27/2020 CLINICAL DATA:  Acute left hip pain after multiple falls. EXAM: DG HIP (WITH OR WITHOUT PELVIS) 2-3V LEFT COMPARISON:  None. FINDINGS: There is no evidence of hip fracture or dislocation. There is no evidence of arthropathy or other focal bone abnormality. IMPRESSION: Negative. Electronically Signed   By: Marijo Conception M.D.   On: 03/27/2020 18:28    EKG: Independently reviewed.  Interpretation limited secondary to paced rhythm.  Assessment/Plan Principal Problem:   UTI (urinary tract infection) Active Problems:   Anemia   Atrial fibrillation (HCC)   Essential hypertension, benign   Recurrent falls   Suspected UTI UA with positive nitrite, moderate amount of leukocytes, 21-50 WBCs, and rare bacteria.  No fever, leukocytosis, or signs of sepsis. -Ceftriaxone.  Order urine culture.  Left-sided weakness and word finding difficulty Recurrent falls Head CT negative for acute intracranial abnormality.  Neuro exam abnormal with slight left upper and lower extremity weakness, slight left pronator drift, and deviation of the tongue to the right. -Brain MRI ordered but cannot be  done until the morning when the pacemaker technician is available.  Neurology has been consulted. PT/OT eval, fall precautions.  Acute metabolic encephalopathy Likely due to UTI versus possible stroke.  Head CT negative for acute intracranial abnormality, brain MRI pending.  Currently AAO x3 and answering questions appropriately. -Continue management as mentioned above  Normocytic anemia Hemoglobin 10.4, MCV 93.7.  On previous labs, hemoglobin was in the 12-13 range.  Patient is not endorsing any symptoms of acute GI bleed.  Does have a large hematoma in her left hip region. -Anemia panel and FOBT.  Monitor H&H.  History of aortic insufficiency status post mechanical valve replacement INR checked at PCPs office today was 2.5. -Holding Coumadin overnight as she has a large hematoma secondary to fall in her left hip region.  Hemoglobin has dropped from baseline.  Repeat H&H in the morning.  Atrial fibrillation/flutter Stable.  Has a pacemaker. -Continue amiodarone.  Holding Coumadin as mentioned above due to large left hip hematoma.  Mildly elevated transaminases AST 68 and ALT 61.  No elevation of alk phos or T bili.  No complaints of abdominal pain, nausea, vomiting. -Right upper quadrant ultrasound  Chronic combined systolic and diastolic CHF No signs of volume overload. -Hold Lasix time.  Non-insulin-dependent type 2 diabetes -Check A1c.  Sliding scale insulin sensitive ACHS and CBG checks.  Hypertension -Brain MRI pending  to rule out stroke, allow permissive hypertension at this time.  Hyperlipidemia -Continue fenofibrate, Crestor  GERD -Continue PPI and H2 blocker  Depression, bipolar disorder -Continue home medications  DVT prophylaxis: Coumadin Code Status: Full code-discussed with the patient. Family Communication: No family at bedside. Disposition Plan: Status is: Observation  The patient remains OBS appropriate and will d/c before 2 midnights.  Dispo: The  patient is from: Home              Anticipated d/c is to: Home              Anticipated d/c date is: 2 days              Patient currently is not medically stable to d/c.  The medical decision making on this patient was of high complexity and the patient is at high risk for clinical deterioration, therefore this is a level 3 visit.  Shela Leff MD [facility]  If 7PM-7AM, please contact night-coverage [email or web address]  03/27/2020, 11:08 PM

## 2020-03-27 NOTE — Consult Note (Signed)
Referring Physician: Dr. Maryan Rued    Chief Complaint: Falls  HPI: [name] is an 69 y.o. female with a history of DM, migraines, recurrent UTIs, anemia, HTN, atrial fibrillation and mechanical aortic valve replacement on Coumadin who presented on Tuesday with recurrent falls over the last 24 hours, in addition to left-sided weakness and intermittent word finding difficulty. She recently got out of rehab about 4-6 weeks ago and she has been living with her daughter since that time. Since rehab, she has been using a walker to ambulate. She has been mentating normally. Yesterday she noticed that she was forgetting her words and not her normal self. Additionally, she has had 4 falls since yesterday despite using her walker and seems to have had weakness of her left leg. She endorses left hip pain due to a recent fall. She also had a migraine headache on Monday per daughter's report. She has not had any visual changes and also denies any chest pain, SOB, abdominal pain, vomiting or diarrhea.   Of note, her INR was 8.8 last week. Coumadin has been undergoing adjustment and her INR in the ED was 2.5.   She started to develop foul-smelling urine and saw her PCP for this on Tuesday. A urine dipstick did show small leukocytes but they were concerned there was something else going on and recommended she come to the ED.   Exam by EDP revealed rapid forgetting and weakness of LLE rated as 4/5, in addition to mild left pronator drift.   Neurology was consulted for possible stroke. On interview by the Neurologist, the patient has difficulty recalling when exactly she was [facility]. By her best estimate, time of onset was > 1 week ago.    Past Medical History:  Diagnosis Date  . ANEMIA 05/28/2010  . AORTIC VALVE REPLACEMENT, HX OF 03/11/2010   Mechanical prosthesis  . BIPOLAR AFFECTIVE DISORDER 03/11/2010  . Depression   . Diverticulitis 12/18/2010  . FIBROIDS, UTERUS 03/11/2010  . Gout 09/04/2016  . Grave's  disease 8-12  . Hyperlipidemia associated with type 2 diabetes mellitus ([place]) 06/05/2016  . Hypertension   . Low back pain 11/02/2017  . Migraine 06/05/2016  . Mixed hyperlipidemia 03/11/2010  . Obesity   . Skin cancer   . Syncope 08/22/2019  . UTI (lower urinary tract infection) 08/14/2011    Past Surgical History:  Procedure Laterality Date  . ABDOMINAL HYSTERECTOMY  ?1998   sb/l spo, total for fibroids/heavy bleeding  . ANKLE SURGERY Left 01/2018   hardware removal  . AORTIC VALVE REPLACEMENT  2011   Mechanical prosthesis, [employer]  . APPENDECTOMY     Required revision for EColi infection, required recurrent packing  . bowel obstruction     Requiring adhesions to be removed  . CHOLECYSTECTOMY    . COLONOSCOPY WITH PROPOFOL N/A 11/01/2015   Procedure: COLONOSCOPY WITH PROPOFOL;  Surgeon: Milus Banister, MD;  Location: [facility];  Service: Endoscopy;  Laterality: N/A;  . CYSTOCELE REPAIR N/A 10/16/2015   Procedure: ANTERIOR REPAIR (CYSTOCELE);  Surgeon: Donnamae Jude, MD;  Location: [place] [facility];  Service: Gynecology;  Laterality: N/A;  . HERNIA REPAIR  08-16-10  . LEFT HEART CATHETERIZATION WITH CORONARY ANGIOGRAM N/A 12/05/2011   Procedure: LEFT HEART CATHETERIZATION WITH CORONARY ANGIOGRAM;  Surgeon: Sinclair Grooms, MD;  Location: [facility] CATH LAB;  Service: Cardiovascular;  Laterality: N/A;  . PACEMAKER IMPLANT N/A 01/09/2020   Procedure: PACEMAKER IMPLANT;  Surgeon: Evans Lance, MD;  Location: [facility] INVASIVE CV  LAB;  Service: Cardiovascular;  Laterality: N/A;  . TOTAL KNEE ARTHROPLASTY Right 02/24/2013   Procedure: RIGHT TOTAL KNEE ARTHROPLASTY;  Surgeon: Johnn Hai, MD;  Location: [facility];  Service: Orthopedics;  Laterality: Right;  . TUBAL LIGATION    . varicose vein surgery b/l legs      Family History  Problem Relation Age of Onset  . Scoliosis Mother   . Arthritis Mother        Rheumatoid  . Aneurysm Mother        heart  . Alcohol abuse Father   . Depression Sister    . Depression Brother   . Alcohol abuse Brother   . Cancer Maternal Grandmother        colon  . Diabetes Maternal Grandfather   . Heart disease Maternal Grandfather   . Alcohol abuse Maternal Grandfather   . Depression Paternal Grandmother   . Diabetes Paternal Grandmother   . Depression Paternal Grandfather   . Diabetes Paternal Grandfather   . Depression Sister   . Alcohol abuse Brother   . Depression Brother   . Heart disease Brother    Social History:  reports that she quit smoking about 29 years ago. She has never used smokeless tobacco. She reports current alcohol use. She reports that she does not use drugs.  Allergies:  Allergies  Allergen Reactions  . Mucinex [Guaifenesin Er] Other (See Comments)    Severe headaches   . Keflex [Cephalexin] Other (See Comments)    Headaches and dizziness  . Sulfa Antibiotics Other (See Comments)    headaches  . Sulfonamide Derivatives Other (See Comments)    Headaches     Medications:  No current facility-administered medications on file prior to encounter.   Current Outpatient Medications on File Prior to Encounter  Medication Sig Dispense Refill  . acetaminophen (TYLENOL) 650 MG CR tablet Take 1,300 mg by mouth every 8 (eight) hours as needed for pain.    Marland Kitchen amiodarone (PACERONE) 200 MG tablet TAKE 1 TABLET BY MOUTH TWICE A DAY (Patient taking differently: Take 200 mg by mouth 2 (two) times daily. ) 180 tablet 3  . amLODipine (NORVASC) 5 MG tablet Take 1 tablet (5 mg total) by mouth daily. 90 tablet 2  . aspirin EC 81 MG tablet Take 81 mg by mouth daily.    . butalbital-acetaminophen-caffeine (FIORICET) 50-325-40 MG tablet Take 1 tablet by mouth as needed for migraine.    . fenofibrate micronized (ANTARA) 130 MG capsule Take 1 capsule (130 mg total) by mouth daily before breakfast. 30 capsule 3  . furosemide (LASIX) 40 MG tablet Take 1 tablet (40 mg total) by mouth daily. 90 tablet 3  . glucose blood (ONETOUCH VERIO) test strip  Check blood sugars once daily Dx: E11.9 100 each 12  . lamoTRIgine (LAMICTAL) 100 MG tablet Take 100 mg by mouth in the morning and 200 mg by mouth at bedtime (Patient taking differently: Take 100-200 mg by mouth See admin instructions. Take 100 mg by mouth in the morning and 200 mg by mouth at bedtime) 270 tablet 1  . Lancets (ONETOUCH ULTRASOFT) lancets Use as instructed to test blood sugars once daily Dx. E11.65 100 each 12  . metFORMIN (GLUCOPHAGE-XR) 500 MG 24 hr tablet Take 500 mg by mouth 2 (two) times daily.    . metoprolol succinate (TOPROL XL) 25 MG 24 hr tablet Take 1 tablet (25 mg total) by mouth daily. 90 tablet 1  . omeprazole (PRILOSEC OTC) 20 MG  tablet Take 20 mg by mouth in the morning and at bedtime.    . ONE TOUCH LANCETS MISC Test once daily to check blood sugar. DX E11.9 100 each 3  . OXcarbazepine (TRILEPTAL) 150 MG tablet Take 1 tablet (150 mg total) by mouth 2 (two) times daily. 180 tablet 1  . pantoprazole (PROTONIX) 40 MG tablet Take 1 tablet (40 mg total) by mouth daily. 90 tablet 3  . QUEtiapine (SEROQUEL XR) 300 MG 24 hr tablet Take 2 tablets (600 mg total) by mouth every evening. 180 tablet 1  . rOPINIRole (REQUIP) 0.25 MG tablet Take 1-2 tablets (0.25-0.5 mg total) by mouth at bedtime. (Patient taking differently: Take 0.5 mg by mouth at bedtime. ) 180 tablet 0  . rosuvastatin (CRESTOR) 40 MG tablet Take 1 tablet (40 mg total) by mouth daily. 90 tablet 1  . SUMAtriptan (IMITREX) 50 MG tablet TAKE ONE TABLET EVERY 2 HOURS AS NEEDED FOR MIGRAINE. MAY REPEAT IN 2 HOURS IF HEADACHE PERSISTS OR RECURS. (MAX OF 2 TABLETS IN 24 HOURS) (Patient taking differently: Take 50 mg by mouth every 2 (two) hours as needed for migraine. ) 10 tablet 0  . venlafaxine XR (EFFEXOR XR) 75 MG 24 hr capsule Take 1 capsule (75 mg total) by mouth daily with breakfast. 90 capsule 1  . warfarin (COUMADIN) 5 MG tablet Take as directed by Coumadin Clinic. (Patient taking differently: Take 2.5-5 mg by  mouth See admin instructions. Take 2.5mg  on Mondays and Fridays, and 5mg  on all other days.Take every night.) 110 tablet 1  . QUEtiapine (SEROQUEL) 100 MG tablet Take 1/2-1 tab po QHS (Patient not taking: Reported on 03/27/2020) 90 tablet 1  . [DISCONTINUED] methimazole (TAPAZOLE) 5 MG tablet Take 1 tablet (5 mg total) by mouth 3 (three) times daily. 30 tablet 2   Continuous: . cefTRIAXone (ROCEPHIN)  IV      ROS: As per HPI. Comprehensive ROS otherwise negative.   Physical Examination: Blood pressure (!) 155/69, pulse 60, temperature 98.8 F (37.1 C), temperature source Oral, resp. rate 15, height 5\' 8"  (1.727 m), weight 97.5 kg, SpO2 99 %.  HEENT: [place]/AT Lungs: Respirations unlabored Ext:  Large hematoma with tenderness over the left hip and proximal femur. Scattered ecchymoses noted. No pedal edema.  Neurologic Examination: Mental Status: Awake and alert. Fully oriented. Thought content appropriate.  Speech fluent with intact comprehension and naming.  Able to follow all commands without difficulty. Cranial Nerves: II:  Visual fields intact. Pupils equal.   III,IV, VI: No ptosis. EOMI. No nystagmus.  V,VII: Smile symmetric, facial temp sensation equal bilaterally VIII: hearing intact to voice IX,X: No hypophonia XI: Slight lag on the left with shoulder shrug XII: Tongue deviates slightly to the left.  Motor: RUE 5/5 RLE 5/5 LUE 4/5 deltoid and trapezius. Mild pronator drift. 5/5 biceps, triceps and grip.  LLE 4+/5 Sensory: Temp and light touch intact throughout, bilaterally. No extinction to DSS.  Deep Tendon Reflexes:  2+ throughout, except left patellar deferred due to pain.  Cerebellar: No ataxia with FNF bilaterally. Mild intention tremor present bilaterally Gait: Deferred  Results for orders placed or performed during the hospital encounter of 03/27/20 (from the past 48 hour(s))  CBG monitoring, ED     Status: Abnormal   Collection Time: 03/27/20  5:27 PM  Result Value  Ref Range   Glucose-Capillary 110 (H) 70 - 99 mg/dL    Comment: Glucose reference range applies only to samples taken after fasting for at least 8  hours.   Comment 1 Notify RN    Comment 2 Document in Chart   Comprehensive metabolic panel     Status: Abnormal   Collection Time: 03/27/20  5:57 PM  Result Value Ref Range   Sodium 139 135 - 145 mmol/L   Potassium 4.0 3.5 - 5.1 mmol/L   Chloride 103 98 - 111 mmol/L   CO2 24 22 - 32 mmol/L   Glucose, Bld 92 70 - 99 mg/dL    Comment: Glucose reference range applies only to samples taken after fasting for at least 8 hours.   BUN 15 8 - 23 mg/dL   Creatinine, Ser 1.30 (H) 0.44 - 1.00 mg/dL   Calcium 8.7 (L) 8.9 - 10.3 mg/dL   Total Protein 6.3 (L) 6.5 - 8.1 g/dL   Albumin 3.9 3.5 - 5.0 g/dL   AST 68 (H) 15 - 41 U/L   ALT 61 (H) 0 - 44 U/L   Alkaline Phosphatase 26 (L) 38 - 126 U/L   Total Bilirubin 0.5 0.3 - 1.2 mg/dL   GFR, Estimated 45 (L) >60 mL/min    Comment: (NOTE) Calculated using the CKD-EPI Creatinine Equation (2021)    Anion gap 12 5 - 15    Comment: Performed at [facility], [place] [address] [place], [place] [address]  Urinalysis, Routine w reflex microscopic Urine, Clean Catch     Status: Abnormal   Collection Time: 03/27/20  8:11 PM  Result Value Ref Range   Color, Urine YELLOW YELLOW   APPearance HAZY (A) CLEAR   Specific Gravity, Urine 1.013 1.005 - 1.030   pH 7.0 5.0 - 8.0   Glucose, UA NEGATIVE NEGATIVE mg/dL   Hgb urine dipstick NEGATIVE NEGATIVE   Bilirubin Urine NEGATIVE NEGATIVE   Ketones, ur NEGATIVE NEGATIVE mg/dL   Protein, ur NEGATIVE NEGATIVE mg/dL   Nitrite POSITIVE (A) NEGATIVE   Leukocytes,Ua MODERATE (A) NEGATIVE   RBC / HPF 0-5 0 - 5 RBC/hpf   WBC, UA 21-50 0 - 5 WBC/hpf   Bacteria, UA RARE (A) NONE SEEN   Squamous Epithelial / LPF 0-5 0 - 5   Mucus PRESENT    Hyaline Casts, UA PRESENT     Comment: Performed at [facility], [place] [address], [place], [place] [address]   CT Head Wo  Contrast  Result Date: 03/27/2020 CLINICAL DATA:  Neuro deficit, acute, stroke suspected Weakness. EXAM: CT HEAD WITHOUT CONTRAST TECHNIQUE: Contiguous axial images were obtained from the base of the skull through the vertex without intravenous contrast. COMPARISON:  Head CT 08/21/2019 FINDINGS: Brain: Stable degree of atrophy and chronic small vessel ischemia. No intracranial hemorrhage, mass effect, or midline shift. No hydrocephalus. The basilar cisterns are patent. No evidence of territorial infarct or acute ischemia. No extra-axial or intracranial fluid collection. Vascular: Atherosclerosis of skullbase vasculature without hyperdense vessel or abnormal calcification. Skull: No fracture or focal lesion. Sinuses/Orbits: Paranasal sinuses and mastoid air cells are clear. The visualized orbits are unremarkable. Other: None. IMPRESSION: 1. No acute intracranial abnormality. 2. Stable atrophy and chronic small vessel ischemia. Electronically Signed   By: Keith Rake M.D.   On: 03/27/2020 18:45   DG HIP UNILAT WITH PELVIS 2-3 VIEWS LEFT  Result Date: 03/27/2020 CLINICAL DATA:  Acute left hip pain after multiple falls. EXAM: DG HIP (WITH OR WITHOUT PELVIS) 2-3V LEFT COMPARISON:  None. FINDINGS: There is no evidence of hip fracture or dislocation. There is no evidence of arthropathy or other focal bone  abnormality. IMPRESSION: Negative. Electronically Signed   By: Marijo Conception M.D.   On: 03/27/2020 18:28    Assessment: 69 y.o. female with new onset of left sided weakness concerning for possible stroke 1. Exam reveals mild left sided weakness 2. CT head is negative for acute intracranial abnormality. Stable atrophy and chronic small vessel ischemia is noted. 3. Stroke Risk Factors -  DM, HTN, atrial fibrillation and mechanical aortic valve replacement (on Coumadin) 4. Has pacemaker  Recommendations: 1. HgbA1c, fasting lipid panel 2. MRI of the brain without contrast if pacemaker is MRI  compatible 3. PT consult, OT consult, Speech consult 4. TTE 5. CTA of head and neck 6. Prophylactic therapy- Although there is a left thigh hematoma, the short term risk of stroke off anticoagulation is high given her atrial fibrillation and mechanical aortic valve. Would restart warfarin today. Order has been re-placed for Pharmacy to dose. Overall morbidity/mortalitiy risk from recurrent stroke off warfarin is significantly higher than overall morbidity/mortality from left thigh hematoma expansion while on warfarin.  7. Risk factor modification 8. Telemetry monitoring 9. Frequent neuro checks 10. Statin 11. BP management.    @Electronically  signed: Dr. Kerney Elbe 03/27/2020, 10:12 PM

## 2020-03-27 NOTE — Progress Notes (Signed)
Virtual Visit via Video Note  I connected with [name] on 03/27/20 at  3:20 PM EDT by a video enabled telemedicine application and verified that I am speaking with the correct person using two identifiers.  Location: Patient: home Provider: work    I discussed the limitations of evaluation and management by telemedicine and the availability of in person appointments. The patient expressed understanding and agreed to proceed. Only the patient and myself were present for today's video call.   History of Present Illness:  Patient is a 69 yr old female who presents today with new onset weakness.  Daughter initially booked today's appointment for "UTI and falls."  Pt missed her earlier appointment time and completed a urinalysis/UTI/CMET and CBC at our request.  She lives with her daughter who is an Therapist, sports in the Neuro ICU at [facility]. Daughter states that patient developed weakness, difficulty walking and confusion approximately 16 hrs ago.  She is also having some difficulty word finding per daughter.  Usually able to walk without the walker but daughter states that she is  She has had 4 falls in the last 16 hours.  Notes that patient is now requiring a walker and and assistance with gait belt in order to walk. She was seen in the coumadin clinic earlier today.  INR was therapeutic today, but was supratherapeutic last week.   Lab Results  Component Value Date   INR 2.5 03/27/2020   INR 8.8 (HH) 03/21/2020   INR >10.0 (HH) 03/21/2020   Patient lives in the basement of their home and has steps to go down. She is currently in the basement and it is very difficult for the daughter to get the patient up and down the stairs in her current state of weakness.  Observations/Objective:   Gen: Awake, alert, no acute distress Resp: Breathing is even and non-labored Psych: calm/pleasant demeanor Neuro: Alert + facial symmetry, speech is clear. Able to move all extremities.  Daughter reports some mild LUE  weakness on hand grasp  Assessment and Plan:  L sided weakness/AMS- new. 16 hours of symptoms.  She is outside of TPA window, but she is unable to walk.  She really needs some urgent brain imaging to rule out progressive CVA as well as bleed given her recent supratherapeutic INR. She may have a UTI which can also be treated in the ED, although I suspect that this is not her primary issue (1+ leuks on dip, culture pending). Cbc is a bit lower. Would recommend checking stool for occult blood if she is admitted. She will also need PT/OT evaluation to make sure that she is stable to return to home given her weakness. Suspect she will need SNF placement.  I have advised patient's daughter to call 911 to have the patient transported to the ED.  She and patient verbalize understanding and agree to call 911 now.    30 minutes spent on today's visit. Follow Up Instructions:    I discussed the assessment and treatment plan with the patient. The patient was provided an opportunity to ask questions and all were answered. The patient agreed with the plan and demonstrated an understanding of the instructions.   The patient was advised to call back or seek an in-person evaluation if the symptoms worsen or if the condition fails to improve as anticipated.  Nance Pear, NP

## 2020-03-27 NOTE — Progress Notes (Addendum)
ANTICOAGULATION CONSULT NOTE - Initial Consult  Pharmacy Consult for Coumadin Indication: AVR/Afib  Allergies  Allergen Reactions  . Mucinex [Guaifenesin Er] Other (See Comments)    Severe headaches   . Keflex [Cephalexin] Other (See Comments)    Headaches and dizziness  . Sulfa Antibiotics Other (See Comments)    headaches  . Sulfonamide Derivatives Other (See Comments)    Headaches     Patient Measurements: Height: 5\' 8"  (172.7 cm) Weight: 97.5 kg (215 lb) IBW/kg (Calculated) : 63.9  Vital Signs: Temp: 98.8 F (37.1 C) (11/02 1730) Temp Source: Oral (11/02 1730) BP: 129/51 (11/02 2200) Pulse Rate: 59 (11/02 2200)  Labs: Recent Labs    03/27/20 1155 03/27/20 1434 03/27/20 1757  HGB 10.4*  --   --   HCT 32.5*  --   --   PLT 264  --   --   INR  --  2.5  --   CREATININE  --   --  1.30*    Estimated Creatinine Clearance: 49.8 mL/min (A) (by C-G formula based on SCr of 1.3 mg/dL (H)).   Medical History: Past Medical History:  Diagnosis Date  . ANEMIA 05/28/2010  . AORTIC VALVE REPLACEMENT, HX OF 03/11/2010   Mechanical prosthesis  . BIPOLAR AFFECTIVE DISORDER 03/11/2010  . Depression   . Diverticulitis 12/18/2010  . FIBROIDS, UTERUS 03/11/2010  . Gout 09/04/2016  . Grave's disease 8-12  . Hyperlipidemia associated with type 2 diabetes mellitus ([place]) 06/05/2016  . Hypertension   . Low back pain 11/02/2017  . Migraine 06/05/2016  . Mixed hyperlipidemia 03/11/2010  . Obesity   . Skin cancer   . Syncope 08/22/2019  . UTI (lower urinary tract infection) 08/14/2011    Assessment: 69yo female c/o weakness and falls, admitted for suspected UTI w/ encephalopathy, to continue Coumadin for AVR and Afib; was seen at outpt [facility] clinic today where INR was at goal, last dose of Coumadin taken 11/1.  Goal of Therapy:  aPTT INR 2.5-3.5 seconds   Plan:  Continue home Coumadin dose of 2.5mg  MF and 5mg  STWTS and monitor INR.  Wynona Neat, PharmD, BCPS  03/27/2020,11:41  PM   Addendum: Hold Coumadin for now given large hip hematoma; f/u when appropriate to resume.

## 2020-03-27 NOTE — Telephone Encounter (Signed)
OK to give VO for OT as requested

## 2020-03-27 NOTE — Telephone Encounter (Signed)
Called brian back left VM message for VO to continue OT sevices

## 2020-03-27 NOTE — Telephone Encounter (Signed)
[name] is calling in regards to patient. He needs verbal orders for OT  2 times a week for 4 weeks to work on strength, balance and endurance. Please call him [phone] and advise.

## 2020-03-27 NOTE — ED Triage Notes (Signed)
Pt arrived via EMS from home reporting 4 falls yesterday. Family reports left sided weakness in her arm and leg since yesterday. Pt went to her pcp today and was negative for UTI. EMS reports 107 bgl. Pt denies any pain at this time.

## 2020-03-28 ENCOUNTER — Observation Stay (HOSPITAL_COMMUNITY): Payer: PPO

## 2020-03-28 ENCOUNTER — Encounter (HOSPITAL_COMMUNITY): Payer: Self-pay | Admitting: Internal Medicine

## 2020-03-28 DIAGNOSIS — G459 Transient cerebral ischemic attack, unspecified: Secondary | ICD-10-CM | POA: Diagnosis not present

## 2020-03-28 DIAGNOSIS — I4821 Permanent atrial fibrillation: Secondary | ICD-10-CM | POA: Diagnosis present

## 2020-03-28 DIAGNOSIS — R059 Cough, unspecified: Secondary | ICD-10-CM | POA: Diagnosis not present

## 2020-03-28 DIAGNOSIS — Z85828 Personal history of other malignant neoplasm of skin: Secondary | ICD-10-CM | POA: Diagnosis not present

## 2020-03-28 DIAGNOSIS — I4892 Unspecified atrial flutter: Secondary | ICD-10-CM | POA: Diagnosis present

## 2020-03-28 DIAGNOSIS — Z9181 History of falling: Secondary | ICD-10-CM | POA: Diagnosis not present

## 2020-03-28 DIAGNOSIS — Z87440 Personal history of urinary (tract) infections: Secondary | ICD-10-CM | POA: Diagnosis not present

## 2020-03-28 DIAGNOSIS — R296 Repeated falls: Secondary | ICD-10-CM

## 2020-03-28 DIAGNOSIS — N3000 Acute cystitis without hematuria: Secondary | ICD-10-CM | POA: Diagnosis present

## 2020-03-28 DIAGNOSIS — G319 Degenerative disease of nervous system, unspecified: Secondary | ICD-10-CM | POA: Diagnosis not present

## 2020-03-28 DIAGNOSIS — R531 Weakness: Secondary | ICD-10-CM | POA: Diagnosis present

## 2020-03-28 DIAGNOSIS — G9341 Metabolic encephalopathy: Secondary | ICD-10-CM | POA: Diagnosis present

## 2020-03-28 DIAGNOSIS — E1169 Type 2 diabetes mellitus with other specified complication: Secondary | ICD-10-CM | POA: Diagnosis present

## 2020-03-28 DIAGNOSIS — N179 Acute kidney failure, unspecified: Secondary | ICD-10-CM | POA: Diagnosis present

## 2020-03-28 DIAGNOSIS — I351 Nonrheumatic aortic (valve) insufficiency: Secondary | ICD-10-CM | POA: Diagnosis present

## 2020-03-28 DIAGNOSIS — Z20822 Contact with and (suspected) exposure to covid-19: Secondary | ICD-10-CM | POA: Diagnosis present

## 2020-03-28 DIAGNOSIS — Z888 Allergy status to other drugs, medicaments and biological substances status: Secondary | ICD-10-CM | POA: Diagnosis not present

## 2020-03-28 DIAGNOSIS — M109 Gout, unspecified: Secondary | ICD-10-CM | POA: Diagnosis present

## 2020-03-28 DIAGNOSIS — T148XXA Other injury of unspecified body region, initial encounter: Secondary | ICD-10-CM | POA: Diagnosis not present

## 2020-03-28 DIAGNOSIS — Z950 Presence of cardiac pacemaker: Secondary | ICD-10-CM | POA: Diagnosis not present

## 2020-03-28 DIAGNOSIS — I4891 Unspecified atrial fibrillation: Secondary | ICD-10-CM

## 2020-03-28 DIAGNOSIS — F319 Bipolar disorder, unspecified: Secondary | ICD-10-CM | POA: Diagnosis present

## 2020-03-28 DIAGNOSIS — Z87891 Personal history of nicotine dependence: Secondary | ICD-10-CM | POA: Diagnosis not present

## 2020-03-28 DIAGNOSIS — Z96651 Presence of right artificial knee joint: Secondary | ICD-10-CM | POA: Diagnosis present

## 2020-03-28 DIAGNOSIS — R29818 Other symptoms and signs involving the nervous system: Secondary | ICD-10-CM | POA: Diagnosis not present

## 2020-03-28 DIAGNOSIS — R7989 Other specified abnormal findings of blood chemistry: Secondary | ICD-10-CM | POA: Diagnosis not present

## 2020-03-28 DIAGNOSIS — Z952 Presence of prosthetic heart valve: Secondary | ICD-10-CM | POA: Diagnosis not present

## 2020-03-28 DIAGNOSIS — E1142 Type 2 diabetes mellitus with diabetic polyneuropathy: Secondary | ICD-10-CM | POA: Diagnosis not present

## 2020-03-28 DIAGNOSIS — N1831 Chronic kidney disease, stage 3a: Secondary | ICD-10-CM | POA: Diagnosis not present

## 2020-03-28 DIAGNOSIS — Z882 Allergy status to sulfonamides status: Secondary | ICD-10-CM | POA: Diagnosis not present

## 2020-03-28 DIAGNOSIS — W19XXXA Unspecified fall, initial encounter: Secondary | ICD-10-CM | POA: Diagnosis present

## 2020-03-28 DIAGNOSIS — I5042 Chronic combined systolic (congestive) and diastolic (congestive) heart failure: Secondary | ICD-10-CM | POA: Diagnosis present

## 2020-03-28 DIAGNOSIS — Z9049 Acquired absence of other specified parts of digestive tract: Secondary | ICD-10-CM | POA: Diagnosis not present

## 2020-03-28 DIAGNOSIS — D631 Anemia in chronic kidney disease: Secondary | ICD-10-CM | POA: Diagnosis not present

## 2020-03-28 DIAGNOSIS — Z7901 Long term (current) use of anticoagulants: Secondary | ICD-10-CM | POA: Diagnosis not present

## 2020-03-28 DIAGNOSIS — N390 Urinary tract infection, site not specified: Secondary | ICD-10-CM | POA: Diagnosis not present

## 2020-03-28 DIAGNOSIS — I110 Hypertensive heart disease with heart failure: Secondary | ICD-10-CM | POA: Diagnosis present

## 2020-03-28 DIAGNOSIS — Y92009 Unspecified place in unspecified non-institutional (private) residence as the place of occurrence of the external cause: Secondary | ICD-10-CM | POA: Diagnosis not present

## 2020-03-28 LAB — CBC
HCT: 32.5 % — ABNORMAL LOW (ref 35.0–45.0)
HCT: 30.3 % — ABNORMAL LOW (ref 36.0–46.0)
Hemoglobin: 10.4 g/dL — ABNORMAL LOW (ref 11.7–15.5)
Hemoglobin: 9.5 g/dL — ABNORMAL LOW (ref 12.0–15.0)
MCH: 30 pg (ref 27.0–33.0)
MCH: 30.4 pg (ref 26.0–34.0)
MCHC: 32 g/dL (ref 32.0–36.0)
MCHC: 31.4 g/dL (ref 30.0–36.0)
MCV: 93.7 fL (ref 80.0–100.0)
MCV: 97.1 fL (ref 80.0–100.0)
MPV: 11.7 fL (ref 7.5–12.5)
Platelets: 264 10*3/uL (ref 140–400)
Platelets: 219 10*3/uL (ref 150–400)
RBC: 3.47 10*6/uL — ABNORMAL LOW (ref 3.80–5.10)
RBC: 3.12 MIL/uL — ABNORMAL LOW (ref 3.87–5.11)
RDW: 13.6 % (ref 11.0–15.0)
RDW: 14.6 % (ref 11.5–15.5)
WBC: 9 10*3/uL (ref 3.8–10.8)
WBC: 6.2 10*3/uL (ref 4.0–10.5)
nRBC: 0 % (ref 0.0–0.2)

## 2020-03-28 LAB — FERRITIN: Ferritin: 123 ng/mL (ref 11–307)

## 2020-03-28 LAB — RESPIRATORY PANEL BY RT PCR (FLU A&B, COVID)
Influenza A by PCR: NEGATIVE
Influenza B by PCR: NEGATIVE
SARS Coronavirus 2 by RT PCR: NEGATIVE

## 2020-03-28 LAB — COMPREHENSIVE METABOLIC PANEL
AG Ratio: 1.8 (calc) (ref 1.0–2.5)
ALT: 63 U/L — ABNORMAL HIGH (ref 6–29)
AST: 61 U/L — ABNORMAL HIGH (ref 10–35)
Albumin: 4.5 g/dL (ref 3.6–5.1)
Alkaline phosphatase (APISO): 34 U/L — ABNORMAL LOW (ref 37–153)
BUN/Creatinine Ratio: 12 (calc) (ref 6–22)
BUN: 17 mg/dL (ref 7–25)
CO2: 26 mmol/L (ref 20–32)
Calcium: 9.5 mg/dL (ref 8.6–10.4)
Chloride: 102 mmol/L (ref 98–110)
Creat: 1.47 mg/dL — ABNORMAL HIGH (ref 0.50–0.99)
Globulin: 2.5 g/dL (calc) (ref 1.9–3.7)
Glucose, Bld: 83 mg/dL (ref 65–99)
Potassium: 4.4 mmol/L (ref 3.5–5.3)
Sodium: 140 mmol/L (ref 135–146)
Total Bilirubin: 0.6 mg/dL (ref 0.2–1.2)
Total Protein: 7 g/dL (ref 6.1–8.1)

## 2020-03-28 LAB — ECHOCARDIOGRAM COMPLETE
AR max vel: 2.05 cm2
AV Area VTI: 2.3 cm2
AV Area mean vel: 1.9 cm2
AV Mean grad: 7 mmHg
AV Peak grad: 16.1 mmHg
Ao pk vel: 2.01 m/s
Area-P 1/2: 2.87 cm2
Height: 68 in
S' Lateral: 3.8 cm
Weight: 3432.12 oz

## 2020-03-28 LAB — GLUCOSE, CAPILLARY
Glucose-Capillary: 101 mg/dL — ABNORMAL HIGH (ref 70–99)
Glucose-Capillary: 88 mg/dL (ref 70–99)
Glucose-Capillary: 109 mg/dL — ABNORMAL HIGH (ref 70–99)
Glucose-Capillary: 113 mg/dL — ABNORMAL HIGH (ref 70–99)

## 2020-03-28 LAB — RETICULOCYTES
Immature Retic Fract: 13.2 % (ref 2.3–15.9)
RBC.: 3.09 MIL/uL — ABNORMAL LOW (ref 3.87–5.11)
Retic Count, Absolute: 51.6 10*3/uL (ref 19.0–186.0)
Retic Ct Pct: 1.7 % (ref 0.4–3.1)

## 2020-03-28 LAB — HEMOGLOBIN A1C
Hgb A1c MFr Bld: 5.3 % (ref 4.8–5.6)
Mean Plasma Glucose: 105.41 mg/dL

## 2020-03-28 LAB — FOLATE: Folate: 21.1 ng/mL (ref >5.9)

## 2020-03-28 LAB — PROTIME-INR
INR: 2.6 — ABNORMAL HIGH (ref 0.8–1.2)
Prothrombin Time: 27.3 seconds — ABNORMAL HIGH (ref 11.4–15.2)

## 2020-03-28 LAB — IRON AND TIBC
Iron: 48 ug/dL (ref 28–170)
Saturation Ratios: 13 % (ref 10.4–31.8)
TIBC: 365 ug/dL (ref 250–450)
UIBC: 317 ug/dL

## 2020-03-28 LAB — CBG MONITORING, ED: Glucose-Capillary: 110 mg/dL — ABNORMAL HIGH (ref 70–99)

## 2020-03-28 LAB — MRSA PCR SCREENING: MRSA by PCR: NEGATIVE

## 2020-03-28 LAB — VITAMIN B12: Vitamin B-12: 162 pg/mL — ABNORMAL LOW (ref 180–914)

## 2020-03-28 MED ORDER — ROPINIROLE HCL 0.5 MG PO TABS
0.5000 mg | ORAL_TABLET | Freq: Every day | ORAL | Status: DC
  Administered 2020-03-28 – 2020-03-30 (×3): 0.5 mg via ORAL
  Filled 2020-03-28 (×4): qty 1

## 2020-03-28 MED ORDER — VITAMIN B-12 1000 MCG PO TABS
1000.0000 ug | ORAL_TABLET | Freq: Every day | ORAL | Status: DC
  Administered 2020-03-29 – 2020-03-31 (×3): 1000 ug via ORAL
  Filled 2020-03-28 (×3): qty 1

## 2020-03-28 MED ORDER — ACETAMINOPHEN 325 MG PO TABS
650.0000 mg | ORAL_TABLET | Freq: Four times a day (QID) | ORAL | Status: DC | PRN
  Administered 2020-03-28: 650 mg via ORAL
  Filled 2020-03-28: qty 2

## 2020-03-28 MED ORDER — CYANOCOBALAMIN 1000 MCG/ML IJ SOLN
1000.0000 ug | Freq: Once | INTRAMUSCULAR | Status: AC
  Administered 2020-03-28: 1000 ug via INTRAMUSCULAR
  Filled 2020-03-28: qty 1

## 2020-03-28 MED ORDER — WARFARIN SODIUM 5 MG PO TABS
5.0000 mg | ORAL_TABLET | Freq: Once | ORAL | Status: AC
  Administered 2020-03-28: 5 mg via ORAL
  Filled 2020-03-28: qty 1

## 2020-03-28 MED ORDER — INFLUENZA VAC A&B SA ADJ QUAD 0.5 ML IM PRSY
0.5000 mL | PREFILLED_SYRINGE | INTRAMUSCULAR | Status: DC
  Filled 2020-03-28: qty 0.5

## 2020-03-28 MED ORDER — WARFARIN - PHARMACIST DOSING INPATIENT: Freq: Every day | Status: DC

## 2020-03-28 NOTE — Plan of Care (Signed)

## 2020-03-28 NOTE — Evaluation (Signed)
Occupational Therapy Evaluation Patient Details Name: ROBENA EWY MRN: [id number] DOB: 1950-09-27 Today's Date: 03/28/2020    History of Present Illness 69 y.o. female with a history of DM, migraines, recurrent UTIs, anemia, HTN, atrial fibrillation and mechanical aortic valve replacement on Coumadin who presented to ED on 03/27/20 with recurrent falls over the last 24 hours, in addition to left-sided weakness and intermittent word finding difficulty.  Found to have Large hematoma with tenderness over the left hip and proximal femur. CT of head was negative for acute intracranial abnormality. MRI pending due to pacemaker. Admitted for observation. MRI reveals no acute intercranial abnormalities.    Clinical Impression   Pt reports living with her family, using a RW for mobility and completing ADL independently. She is reliant on her family for IADL and reports many falls. Pt presents with impaired cognition, L side weakness and incoordination and impaired standing balance. Pt requires up to min assist for ADL and mod assist for ambulation as she fatigues and L LE buckles. Will follow acutely, recommending HHOT upon discharge.     Follow Up Recommendations  Home health OT;Supervision/Assistance - 24 hour    Equipment Recommendations  Tub/shower bench    Recommendations for Other Services       Precautions / Restrictions Precautions Precautions: Fall Precaution Comments: 4 recent falls, L knee buckles Restrictions Weight Bearing Restrictions: No      Mobility Bed Mobility Overal bed mobility: Needs Assistance Bed Mobility: Supine to Sit     Supine to sit: Supervision     General bed mobility comments: supervision for safety, use of bedrail to pull to EoB    Transfers Overall transfer level: Needs assistance Equipment used: Rolling walker (2 wheeled) Transfers: Sit to/from Stand Sit to Stand: From elevated surface;Min assist         General transfer comment: pt reached  up to RW but remembered proper form to power up from bed, pt attempted 3x without sucess, with bed elevated, pt requires light minA to come to standing and steadying in RW, decreased control of descent into chair     Balance Overall balance assessment: Needs assistance Sitting-balance support: Feet supported;No upper extremity supported Sitting balance-Leahy Scale: Fair Sitting balance - Comments: no LOB with donning shoes, but with fatigues, leans posterior   Standing balance support: Bilateral upper extremity supported;During functional activity;Single extremity supported Standing balance-Leahy Scale: Poor Standing balance comment: requires at least single UE support                           ADL either performed or assessed with clinical judgement   ADL Overall ADL's : Needs assistance/impaired Eating/Feeding: Set up;Bed level   Grooming: Sitting;Supervision/safety   Upper Body Bathing: Supervision/ safety;Sitting   Lower Body Bathing: Minimal assistance;Sit to/from stand   Upper Body Dressing : Minimal assistance;Sitting   Lower Body Dressing: Supervision/safety;Sitting/lateral leans Lower Body Dressing Details (indicate cue type and reason): donned slip on shoes Toilet Transfer: Min guard;Ambulation;RW   Toileting- Clothing Manipulation and Hygiene: Minimal assistance;Sit to/from stand       Functional mobility during ADLs: Min guard;Moderate assistance;Rolling walker (pt with increased weakness as she walks, requiring mod assis)       Vision Baseline Vision/History: Wears glasses Wears Glasses: At all times Patient Visual Report: No change from baseline       Perception     Praxis      Pertinent Vitals/Pain Pain Assessment: Faces Pain Location: L  knee  Pain Descriptors / Indicators: Other (Comment) (s) Pain Intervention(s): Monitored during session;Repositioned     Hand Dominance Right   Extremity/Trunk Assessment Upper Extremity  Assessment Upper Extremity Assessment: LUE deficits/detail LUE Deficits / Details: impaired coordination, prior CVA LUE Sensation: decreased proprioception LUE Coordination: decreased fine motor;decreased gross motor   Lower Extremity Assessment Lower Extremity Assessment: Defer to PT evaluation LLE Deficits / Details: ROM WFL, strength 3+/5 LLE Coordination: decreased fine motor   Cervical / Trunk Assessment Cervical / Trunk Assessment: Kyphotic   Communication Communication Communication: No difficulties   Cognition Arousal/Alertness: Awake/alert Behavior During Therapy: WFL for tasks assessed/performed Overall Cognitive Status: Impaired/Different from baseline Area of Impairment: Attention;Memory;Following commands;Safety/judgement;Awareness;Problem solving                   Current Attention Level: Selective Memory: Decreased short-term memory Following Commands: Follows multi-step commands inconsistently;Follows one step commands inconsistently Safety/Judgement: Decreased awareness of safety;Decreased awareness of deficits Awareness: Emergent Problem Solving: Slow processing;Difficulty sequencing;Requires verbal cues;Requires tactile cues General Comments: pt with decreased awareness of her deficits and safety, requires repeated cuing for proximity to RW    General Comments  BP 105/56 HR 71 SaO2 on RA 98%    Exercises     Shoulder Instructions      Home Living Family/patient expects to be discharged to:: Private residence Living Arrangements: Children Available Help at Discharge: Family;Available 24 hours/day Type of Home: House Home Access: Stairs to enter [employer] of Steps: 4 Entrance Stairs-Rails: None Home Layout: Multi-level Alternate Level Stairs-Number of Steps: 12   Bathroom Shower/Tub: Teacher, early years/pre: Standard     Home Equipment: Environmental consultant - 4 wheels;Walker - 2 wheels;Wheelchair - power;Bedside commode           Prior Functioning/Environment Level of Independence: Needs assistance  Gait / Transfers Assistance Needed: household distance with RW,  ADL's / Homemaking Assistance Needed: reports independence with ADLs, pt was doing medications and bills but daughter will be taking over after mix up, does not drive   Comments: numerous falls         OT Problem List: Decreased strength;Decreased activity tolerance;Impaired balance (sitting and/or standing);Decreased coordination;Decreased cognition;Decreased safety awareness;Decreased knowledge of use of DME or AE;Pain;Impaired UE functional use;Obesity      OT Treatment/Interventions: Self-care/ADL training;DME and/or AE instruction;Cognitive remediation/compensation;Patient/family education;Balance training;Therapeutic activities    OT Goals(Current goals can be found in the care plan section) Acute Rehab OT Goals Patient Stated Goal: stop falling OT Goal Formulation: With patient Time For Goal Achievement: 04/11/20 Potential to Achieve Goals: Good ADL Goals Pt Will Perform Grooming: with supervision;standing Pt Will Perform Upper Body Dressing: with set-up;sitting Pt Will Perform Lower Body Dressing: with supervision;sit to/from stand Pt Will Transfer to Toilet: with supervision;ambulating;bedside commode (over toilet) Pt Will Perform Toileting - Clothing Manipulation and hygiene: with supervision;sit to/from stand Additional ADL Goal #1: Pt will be aware of availability of tub transfer bench to prevent attempts to get down into tub.  OT Frequency: Min 2X/week   Barriers to D/C:            Co-evaluation PT/OT/SLP Co-Evaluation/Treatment: Yes Reason for Co-Treatment: For patient/therapist safety PT goals addressed during session: Mobility/safety with mobility OT goals addressed during session: ADL's and self-care;Proper use of Adaptive equipment and DME      AM-PAC OT "6 Clicks" Daily Activity     Outcome Measure Help from another  person eating meals?: None Help from another person taking care of personal grooming?:  A Little Help from another person toileting, which includes using toliet, bedpan, or urinal?: A Little Help from another person bathing (including washing, rinsing, drying)?: A Little Help from another person to put on and taking off regular upper body clothing?: A Little Help from another person to put on and taking off regular lower body clothing?: A Little 6 Click Score: 19   End of Session Equipment Utilized During Treatment: Rolling walker;Gait belt  Activity Tolerance: Patient tolerated treatment well Patient left: in chair;with call bell/phone within reach;with chair alarm set  OT Visit Diagnosis: Unsteadiness on feet (R26.81);Other abnormalities of gait and mobility (R26.89);Hemiplegia and hemiparesis;Muscle weakness (generalized) (M62.81);Other symptoms and signs involving cognitive function Hemiplegia - Right/Left: Left Hemiplegia - dominant/non-dominant: Non-Dominant Hemiplegia - caused by: Cerebral infarction                Time: 5009-3818 OT Time Calculation (min): 26 min Charges:  OT General Charges $OT Visit: 1 Visit OT Evaluation $OT Eval Moderate Complexity: 1 Mod  [name], OTR/L Acute Rehabilitation Services Pager: [phone] Office: [phone] [name] 03/28/2020, 4:39 PM

## 2020-03-28 NOTE — Progress Notes (Signed)
Carotid duplex has been completed.   Preliminary results in CV Proc.   Abram Sander 03/28/2020 3:00 PM

## 2020-03-28 NOTE — Plan of Care (Signed)
  Problem: Education: Goal: Knowledge of General Education information will improve Description: Including pain rating scale, medication(s)/side effects and non-pharmacologic comfort measures Outcome: Progressing   Problem: Clinical Measurements: Goal: Ability to maintain clinical measurements within normal limits will improve Outcome: Progressing   Problem: Clinical Measurements: Goal: Cardiovascular complication will be avoided Outcome: Progressing   Problem: Coping: Goal: Level of anxiety will decrease Outcome: Progressing   Problem: Pain Managment: Goal: General experience of comfort will improve Outcome: Progressing   Problem: Safety: Goal: Ability to remain free from injury will improve Outcome: Progressing   Problem: Skin Integrity: Goal: Risk for impaired skin integrity will decrease Outcome: Progressing

## 2020-03-28 NOTE — Progress Notes (Signed)
OT Cancellation Note  Patient Details Name: [name] MRN: [id number] DOB: 1950-06-20   Cancelled Treatment:    Reason Eval/Treat Not Completed:  (Eating lunch, will return.)  [name] 03/28/2020, 2:16 PM  [name], OTR/L Acute Rehabilitation Services Pager: [phone] Office: [phone]

## 2020-03-28 NOTE — Progress Notes (Signed)
STROKE TEAM PROGRESS NOTE   INTERVAL HISTORY Ultrasound tech at bedside.  Patient on the phone.  Patient lying in bed, no acute distress.  Still complaining of mild weakness on the left side.  Denies any headache.  Denies any history of a stroke.  She did had fall at home, with left hip pain and left thigh bruises.  Found to have mild anemia, AKI and UTI.  Patient did stated that she had frequent UTI, hard to prevent.  INR this morning 2.6.  MRI negative for stroke.  MRA unremarkable, carotid Doppler unremarkable.  Vitals:   03/28/20 0800 03/28/20 0834 03/28/20 1151 03/28/20 1200  BP: (!) 100/53  108/84   Pulse: 68 67 74   Resp: 19 20 15    Temp: 97.7 F (36.5 C)   99.6 F (37.6 C)  TempSrc:    Oral  SpO2:  96%  98%  Weight:      Height:       CBC:  Recent Labs  Lab 03/27/20 1155 03/28/20 0422  WBC 9.0 6.2  HGB 10.4* 9.5*  HCT 32.5* 30.3*  MCV 93.7 97.1  PLT 264 924   Basic Metabolic Panel:  Recent Labs  Lab 03/27/20 1155 03/27/20 1757  NA 140 139  K 4.4 4.0  CL 102 103  CO2 26 24  GLUCOSE 83 92  BUN 17 15  CREATININE 1.47* 1.30*  CALCIUM 9.5 8.7*   Lipid Panel: No results for input(s): CHOL, TRIG, HDL, CHOLHDL, VLDL, LDLCALC in the last 168 hours. HgbA1c:  Recent Labs  Lab 03/28/20 0422  HGBA1C 5.3   Urine Drug Screen: No results for input(s): LABOPIA, COCAINSCRNUR, LABBENZ, AMPHETMU, THCU, LABBARB in the last 168 hours.  Alcohol Level No results for input(s): ETH in the last 168 hours.  IMAGING past 24 hours CT Head Wo Contrast  Result Date: 03/27/2020 CLINICAL DATA:  Neuro deficit, acute, stroke suspected Weakness. EXAM: CT HEAD WITHOUT CONTRAST TECHNIQUE: Contiguous axial images were obtained from the base of the skull through the vertex without intravenous contrast. COMPARISON:  Head CT 08/21/2019 FINDINGS: Brain: Stable degree of atrophy and chronic small vessel ischemia. No intracranial hemorrhage, mass effect, or midline shift. No hydrocephalus. The  basilar cisterns are patent. No evidence of territorial infarct or acute ischemia. No extra-axial or intracranial fluid collection. Vascular: Atherosclerosis of skullbase vasculature without hyperdense vessel or abnormal calcification. Skull: No fracture or focal lesion. Sinuses/Orbits: Paranasal sinuses and mastoid air cells are clear. The visualized orbits are unremarkable. Other: None. IMPRESSION: 1. No acute intracranial abnormality. 2. Stable atrophy and chronic small vessel ischemia. Electronically Signed   By: Keith Rake M.D.   On: 03/27/2020 18:45   MR ANGIO HEAD WO CONTRAST  Result Date: 03/28/2020 CLINICAL DATA:  Neuro deficit, acute, stroke suspected. Additional history provided: Left-sided weakness, word finding difficulty, multiple falls. EXAM: MRI HEAD WITHOUT CONTRAST MRA HEAD WITHOUT CONTRAST TECHNIQUE: Multiplanar, multiecho pulse sequences of the brain and surrounding structures were obtained without intravenous contrast. Angiographic images of the head were obtained using MRA technique without contrast. COMPARISON:  Noncontrast head CT 03/27/2020. CT angiogram head/neck 08/21/2019. FINDINGS: MRI HEAD FINDINGS Mild-to-moderate intermittent motion degradation. Brain: Mild generalized cerebral atrophy. Chronic hemorrhagic lacunar infarct within the right lentiform nucleus. Mild multifocal T2/FLAIR hyperintensity within the cerebral white matter is nonspecific, but compatible with chronic small vessel ischemic disease. This includes a chronic lacunar infarct within the left parietal white matter (series 7, image 15). There is no acute infarct. No evidence of intracranial  mass. No extra-axial fluid collection. No midline shift. Vascular: Reported below. Skull and upper cervical spine: No focal marrow lesion. Sinuses/Orbits: Visualized orbits show no acute finding. Mild ethmoid sinus mucosal thickening. No significant mastoid effusion. MRA HEAD FINDINGS The examination is moderate to severely  motion degraded, significantly limiting evaluation for stenoses and for intracranial aneurysms. No intracranial large vessel occlusion. Apparent high-grade stenosis within the V4 right vertebral artery (series 1203, image 6). Within described limitations, no intracranial aneurysm is identified. IMPRESSION: MRI head: 1. Mild-to-moderate intermittent motion degradation. 2. No evidence of acute intracranial abnormality, including acute infarction. 3. Chronic hemorrhagic lacunar infarct within the right basal ganglia. 4. Chronic lacunar infarct within the left parietal white matter. 5. Background mild generalized cerebral atrophy and chronic small vessel ischemic disease. MRA head: 1. Moderate to severely motion degraded examination, significantly limiting evaluation for intracranial arterial stenoses. 2. No intracranial large vessel occlusion. 3. Apparent high-grade stenosis within the V4 right vertebral artery. Electronically Signed   By: Kellie Simmering DO   On: 03/28/2020 11:18   MR BRAIN WO CONTRAST  Result Date: 03/28/2020 CLINICAL DATA:  Neuro deficit, acute, stroke suspected. Additional history provided: Left-sided weakness, word finding difficulty, multiple falls. EXAM: MRI HEAD WITHOUT CONTRAST MRA HEAD WITHOUT CONTRAST TECHNIQUE: Multiplanar, multiecho pulse sequences of the brain and surrounding structures were obtained without intravenous contrast. Angiographic images of the head were obtained using MRA technique without contrast. COMPARISON:  Noncontrast head CT 03/27/2020. CT angiogram head/neck 08/21/2019. FINDINGS: MRI HEAD FINDINGS Mild-to-moderate intermittent motion degradation. Brain: Mild generalized cerebral atrophy. Chronic hemorrhagic lacunar infarct within the right lentiform nucleus. Mild multifocal T2/FLAIR hyperintensity within the cerebral white matter is nonspecific, but compatible with chronic small vessel ischemic disease. This includes a chronic lacunar infarct within the left parietal  white matter (series 7, image 15). There is no acute infarct. No evidence of intracranial mass. No extra-axial fluid collection. No midline shift. Vascular: Reported below. Skull and upper cervical spine: No focal marrow lesion. Sinuses/Orbits: Visualized orbits show no acute finding. Mild ethmoid sinus mucosal thickening. No significant mastoid effusion. MRA HEAD FINDINGS The examination is moderate to severely motion degraded, significantly limiting evaluation for stenoses and for intracranial aneurysms. No intracranial large vessel occlusion. Apparent high-grade stenosis within the V4 right vertebral artery (series 1203, image 6). Within described limitations, no intracranial aneurysm is identified. IMPRESSION: MRI head: 1. Mild-to-moderate intermittent motion degradation. 2. No evidence of acute intracranial abnormality, including acute infarction. 3. Chronic hemorrhagic lacunar infarct within the right basal ganglia. 4. Chronic lacunar infarct within the left parietal white matter. 5. Background mild generalized cerebral atrophy and chronic small vessel ischemic disease. MRA head: 1. Moderate to severely motion degraded examination, significantly limiting evaluation for intracranial arterial stenoses. 2. No intracranial large vessel occlusion. 3. Apparent high-grade stenosis within the V4 right vertebral artery. Electronically Signed   By: Kellie Simmering DO   On: 03/28/2020 11:18   DG CHEST PORT 1 VIEW  Result Date: 03/28/2020 CLINICAL DATA:  Weakness and cough over the last day. EXAM: PORTABLE CHEST 1 VIEW COMPARISON:  01/09/2020 FINDINGS: Previous median sternotomy and valve replacement, probably aortic. Chronic cardiomegaly. Dual lead pacemaker in place. The lungs are clear. The vascularity is normal. No visible effusion. Surgical clips right axilla. IMPRESSION: No active disease. Previous valve replacement. Dual lead pacemaker. Electronically Signed   By: Nelson Chimes M.D.   On: 03/28/2020 14:11   DG  HIP UNILAT WITH PELVIS 2-3 VIEWS LEFT  Result Date: 03/27/2020 CLINICAL DATA:  Acute left hip pain after multiple falls. EXAM: DG HIP (WITH OR WITHOUT PELVIS) 2-3V LEFT COMPARISON:  None. FINDINGS: There is no evidence of hip fracture or dislocation. There is no evidence of arthropathy or other focal bone abnormality. IMPRESSION: Negative. Electronically Signed   By: Marijo Conception M.D.   On: 03/27/2020 18:28   US Abdomen Limited RUQ (LIVER/GB)  Result Date: 03/27/2020 CLINICAL DATA:  Elevated liver function tests. Status post cholecystectomy 30 years ago per patient. EXAM: ULTRASOUND ABDOMEN LIMITED RIGHT UPPER QUADRANT COMPARISON:  CT abdomen pelvis 10/16/2015. FINDINGS: Gallbladder: Post cholecystectomy. Common bile duct: Diameter: 8 mm Liver: No focal lesion identified. Within normal limits in parenchymal echogenicity. Portal vein is patent on color Doppler imaging with normal direction of blood flow towards the liver. Other: Visualized right kidney demonstrates no hydronephrosis. IMPRESSION: Unremarkable right upper quadrant ultrasound in a patient that is status post cholecystectomy. Electronically Signed   By: Iven Finn M.D.   On: 03/27/2020 23:53    PHYSICAL EXAM  Temp:  [97.7 F (36.5 C)-99.6 F (37.6 C)] 98.1 F (36.7 C) (11/03 1604) Pulse Rate:  [59-78] 70 (11/03 1608) Resp:  [13-21] 19 (11/03 1608) BP: (99-158)/(51-96) 102/67 (11/03 1608) SpO2:  [95 %-100 %] 98 % (11/03 1608) Weight:  [97.3 kg] 97.3 kg (11/03 0125)  General - Well nourished, well developed, in no apparent distress.  Ophthalmologic - fundi not visualized due to noncooperation.  Cardiovascular - irregularly irregular heart rate and rhythm.  Mental Status -  Level of arousal and orientation to time, place, and person were intact. Language including expression, naming, repetition, comprehension was assessed and found intact. Fund of Knowledge was assessed and was intact.  Cranial Nerves II - XII - II -  Visual field intact OU. III, IV, VI - Extraocular movements intact. V - Facial sensation intact bilaterally. VII - Facial movement intact bilaterally. VIII - Hearing & vestibular intact bilaterally. X - Palate elevates symmetrically. XI - Chin turning & shoulder shrug intact bilaterally. XII - Tongue protrusion intact.  Motor Strength - The patient's strength was normal in all extremities and pronator drift was absent except left lower extremity 4+/5 largely due to left hip pain and left thigh bruises/hematoma due to fall.  Bulk was normal and fasciculations were absent.   Motor Tone - Muscle tone was assessed at the neck and appendages and was normal.  Reflexes - The patient's reflexes were symmetrical in all extremities and she had no pathological reflexes.  Sensory - Light touch, temperature/pinprick were assessed and were symmetrical.  Proprioception intact on the right foot, decreased on the left foot.  Coordination - The patient had normal movements in the hands with no ataxia or dysmetria.  Tremor was absent.  Gait and Station - deferred.   ASSESSMENT/PLAN [name] is a 69 y.o. female with history of DM, migraines, recurrent UTIs, anemia, HTN, atrial fibrillation and mechanical aortic valve replacement on Coumadin presenting following recurrent falls w/ L sided weakness and intermittent word finding difficulties w/ migraine HA on Monday.    Encephalopathy/recrudescence of old stroke in the setting of medical conditions including anemia, AKI, UTI and B12 deficiency  Resultant recurrent falls w/ L sided weakness and word finding difficulties   CT head No acute abnormality. Small vessel disease. Atrophy.   MRI  No acute infarct. Chronic R basal ganglia lacune. Chronic L parietal white matter lacune. Small vessel disease. Atrophy.   MRA  Motion. No LVO. R V4 high-grade stenosis  Carotid Doppler unremarkable  CT head and neck 07/2019 unremarkable  2D Echo  pending  LDL pending   HgbA1c 5.3  VTE prophylaxis - warfarin  aspirin 81 mg daily and warfarin daily prior to admission, now on aspirin 81 mg daily and warfarin daily.   Therapy recommendations:  pending   Disposition:  pending  (d/c from rehab 4-6 wks ago, living w/ dtr)  Atrial Fibrillation/Flutter Mechanical aortic valve  Home anticoagulation:  warfarin daily   Last INR 2.5-> 2.6  PPM  Continue Coumadin with INR goal 2 -3   B12 deficiency  Part of the reason of falling  Decreased proprioception on the left foot  Stat post B12 injection  Continue B12 p.o. from tomorrow  UTI  Frequent UTI in the past  UA WBCs 21-50  Urine culture pending  On Rocephin  AKI  Baseline creatinine 1.1  This admission creatinine 1.47->1.30  Encourage p.o. intake  Avoid dehydration  Hypertension  Stable . BP goal normotensive  Anemia, chronic disease  Hemoglobin baseline 12-13  This admission hemoglobin 10.4->9.5  Iron 48, ferritin 123  Hyperlipidemia  Home meds:  crestor 40, resumed in hospital  LDL pending, goal < 70  Continue statin at discharge  Diabetes, type II  HgbA1c 5.3, goal < 7.0  CBG monitoring  SSI  Close PCP follow-up  Other Stroke Risk Factors  Advanced Age >/= 7   Former Cigarette smoker, quit 29 yrs ago  ETOH use, advised to drink no more than 1 drink(s) a day  Obesity, Body mass index is 32.62 kg/m., BMI >/= 30 associated with increased stroke risk, recommend weight loss, diet and exercise as appropriate   Migraines  Chronic combined systolic and diastolic CHF  Other Active Problems  Mildly elevated transaminases  GERD  Depression, bipolar d/o, on Lamictal, Seroquel and Effexor  Hospital day # 0  I spent  35 minutes in total face-to-face time with the patient, more than 50% of which was spent in counseling and coordination of care, reviewing test results, images and medication, and discussing the diagnosis,  treatment plan and potential prognosis. This patient's care requiresreview of multiple databases, neurological assessment, discussion with family, other specialists and medical decision making of high complexity. I had long discussion with daughter over the phone, updated pt current condition, treatment plan and potential prognosis, and answered all the questions.  She expressed understanding and appreciation.  I also discussed with Dr. Eliseo Squires.   Rosalin Hawking, MD PhD Stroke Neurology 03/28/2020 7:34 PM    To contact Stroke Continuity provider, please refer to [email or web address] After hours, contact General Neurology

## 2020-03-28 NOTE — Progress Notes (Signed)
  Echocardiogram 2D Echocardiogram has been performed.  [name] 03/28/2020, 11:58 AM

## 2020-03-28 NOTE — Evaluation (Signed)
Physical Therapy Evaluation Patient Details Name: [name] MRN: [id number] DOB: 13-Apr-1951 Today's Date: 03/28/2020   History of Present Illness  69 y.o. female with a history of DM, migraines, recurrent UTIs, anemia, HTN, atrial fibrillation and mechanical aortic valve replacement on Coumadin who presented to ED on 03/27/20 with recurrent falls over the last 24 hours, in addition to left-sided weakness and intermittent word finding difficulty.  Found to have Large hematoma with tenderness over the left hip and proximal femur. CT of head was negative for acute intracranial abnormality. MRI pending due to pacemaker. Admitted for observation. MRI reveals no acute intercranial abnormalities.   Clinical Impression  PTA pt living in multistory home with daughter, pt lives in basement down 12 stairs, and 4 steps to enter. Pt reports she was getting around with RW after d/c from SNF and was independent with ADLs and iADLs, but mentions she "messed" up her medicines and now her daughter was taking care of her meds. Pt is currently limited in safe mobility, by decreased knowledge of her L sided weakness which increases with mobility. Pt with poor safety awareness especially with use of RW. Pt is supervision for bed mobility, min A for transfers and becomes modA for ambulation of 80 feet with RW. Pt is likely close to her baseline. PT recommending HHPT to improve safe mobility, however pt will require 24 hour assistance due to her decreased memory of deficits. PT will continue to follow acutely to progress mobility.     Follow Up Recommendations Home health PT;Supervision/Assistance - 24 hour    Equipment Recommendations  None recommended by PT    Recommendations for Other Services       Precautions / Restrictions Precautions Precautions: Fall Precaution Comments: 4 recent falls  Restrictions Weight Bearing Restrictions: No      Mobility  Bed Mobility Overal bed mobility: Needs Assistance Bed  Mobility: Supine to Sit     Supine to sit: Supervision     General bed mobility comments: supervision for safety, use of bedrail to pull to EoB    Transfers Overall transfer level: Needs assistance Equipment used: None Transfers: Sit to/from Stand Sit to Stand: From elevated surface;Min assist         General transfer comment: pt reached up to RW but remembered proper form to power up from bed, pt attempted 3x without sucess, with bed elevated, pt requires light minA to come to standing and steadying in RW   Ambulation/Gait Ambulation/Gait assistance: Min assist;Mod assist;Min guard Gait Distance (Feet): 80 Feet Assistive device: Rolling walker (2 wheeled) Gait Pattern/deviations: Step-through pattern;Decreased stance time - left;Decreased step length - right Gait velocity: slowed Gait velocity interpretation: <1.8 ft/sec, indicate of risk for recurrent falls General Gait Details: pt started with min guard assist, L knee buckling started at about 30 feet ambulation, by 60 feet ambulation pt was requiring modA for support on L, pt reports knowing her knee is buckling however does not take any  advice for resting, or increased support from UE        Balance Overall balance assessment: Needs assistance Sitting-balance support: Feet supported;No upper extremity supported Sitting balance-Leahy Scale: Good     Standing balance support: Bilateral upper extremity supported;During functional activity;Single extremity supported Standing balance-Leahy Scale: Poor Standing balance comment: requires at least single UE support                             Pertinent Vitals/Pain  Pain Assessment: 0-10 Pain Location: L knee  Pain Descriptors / Indicators: Other (Comment) (s) Pain Intervention(s): Limited activity within patient's tolerance;Monitored during session;Repositioned    Home Living Family/patient expects to be discharged to:: Private residence Living  Arrangements: Children Available Help at Discharge: Family;Available 24 hours/day Type of Home: House Home Access: Stairs to enter Entrance Stairs-Rails: None Entrance Stairs-Number of Steps: 4 Home Layout: Multi-level (lives in walk out basement) Home Equipment: Environmental consultant - 4 wheels;Walker - 2 wheels;Wheelchair - power;Bedside commode      Prior Function Level of Independence: Needs assistance   Gait / Transfers Assistance Needed: household distance with RW,   ADL's / Homemaking Assistance Needed: reports independence with ADLs, pt was doing medications and bills but daughter will be taking over after mix up   Comments: numerous falls      Hand Dominance   Dominant Hand: Right    Extremity/Trunk Assessment   Upper Extremity Assessment Upper Extremity Assessment: Defer to OT evaluation    Lower Extremity Assessment Lower Extremity Assessment: LLE deficits/detail LLE Deficits / Details: ROM WFL, strength 3+/5 LLE Coordination: decreased fine motor    Cervical / Trunk Assessment Cervical / Trunk Assessment: Kyphotic  Communication   Communication: No difficulties  Cognition Arousal/Alertness: Awake/alert Behavior During Therapy: WFL for tasks assessed/performed Overall Cognitive Status: Impaired/Different from baseline Area of Impairment: Attention;Memory;Following commands;Safety/judgement;Awareness;Problem solving                   Current Attention Level: Selective Memory: Decreased short-term memory Following Commands: Follows multi-step commands inconsistently;Follows one step commands inconsistently Safety/Judgement: Decreased awareness of safety;Decreased awareness of deficits Awareness: Emergent Problem Solving: Slow processing;Difficulty sequencing;Requires verbal cues;Requires tactile cues General Comments: pt with decreased awareness of her deficits and safety, requires repeated cuing for proximity to RW       General Comments General comments (skin  integrity, edema, etc.): BP 105/56 HR 71 SaO2 on RA 98%        Assessment/Plan    PT Assessment Patient needs continued PT services  PT Problem List Decreased strength;Decreased activity tolerance;Decreased balance;Decreased safety awareness;Decreased cognition;Decreased knowledge of use of DME       PT Treatment Interventions DME instruction;Gait training;Stair training;Functional mobility training;Therapeutic activities;Therapeutic exercise;Balance training;Cognitive remediation;Patient/family education    PT Goals (Current goals can be found in the Care Plan section)  Acute Rehab PT Goals Patient Stated Goal: stop falling PT Goal Formulation: With patient Time For Goal Achievement: 04/11/20 Potential to Achieve Goals: Fair    Frequency Min 3X/week        Co-evaluation PT/OT/SLP Co-Evaluation/Treatment: Yes Reason for Co-Treatment: For patient/therapist safety PT goals addressed during session: Mobility/safety with mobility OT goals addressed during session: Proper use of Adaptive equipment and DME       AM-PAC PT "6 Clicks" Mobility  Outcome Measure Help needed turning from your back to your side while in a flat bed without using bedrails?: None Help needed moving from lying on your back to sitting on the side of a flat bed without using bedrails?: None Help needed moving to and from a bed to a chair (including a wheelchair)?: A Little Help needed standing up from a chair using your arms (e.g., wheelchair or bedside chair)?: A Little Help needed to walk in hospital room?: A Little Help needed climbing 3-5 steps with a railing? : A Lot 6 Click Score: 19    End of Session Equipment Utilized During Treatment: Gait belt Activity Tolerance: Patient tolerated treatment well;Other (comment) (unaware of increasing  L knee buckling) Patient left: in chair;with call bell/phone within reach;with chair alarm set Nurse Communication: Mobility status PT Visit Diagnosis:  Unsteadiness on feet (R26.81);Repeated falls (R29.6);Other abnormalities of gait and mobility (R26.89);Muscle weakness (generalized) (M62.81);Hemiplegia and hemiparesis Hemiplegia - Right/Left: Left Hemiplegia - dominant/non-dominant: Non-dominant Hemiplegia - caused by: Cerebral infarction    Time: 1219-7588 PT Time Calculation (min) (ACUTE ONLY): 38 min   Charges:   PT Evaluation $PT Eval Moderate Complexity: 1 Mod          Anayah Arvanitis B. Migdalia Dk PT, DPT Acute Rehabilitation Services Pager [phone] Office [phone]   [place] 03/28/2020, 3:51 PM

## 2020-03-28 NOTE — Progress Notes (Signed)
[facility] for warfarin Indication: AVR/Afib  Allergies  Allergen Reactions  . Mucinex [Guaifenesin Er] Other (See Comments)    Severe headaches   . Keflex [Cephalexin] Other (See Comments)    Headaches and dizziness  . Sulfa Antibiotics Other (See Comments)    headaches  . Sulfonamide Derivatives Other (See Comments)    Headaches     Patient Measurements: Height: 5\' 8"  (172.7 cm) Weight: 97.3 kg (214 lb 8.1 oz) IBW/kg (Calculated) : 63.9  Vital Signs: Temp: 97.7 F (36.5 C) (11/03 0800) Temp Source: Oral (11/03 0419) BP: 100/53 (11/03 0800) Pulse Rate: 67 (11/03 0834)  Labs: Recent Labs    03/27/20 1155 03/27/20 1434 03/27/20 1757 03/28/20 0422  HGB 10.4*  --   --  9.5*  HCT 32.5*  --   --  30.3*  PLT 264  --   --  219  LABPROT  --   --   --  27.3*  INR  --  2.5  --  2.6*  CREATININE 1.47*  --  1.30*  --     Estimated Creatinine Clearance: 49.8 mL/min (A) (by C-G formula based on SCr of 1.3 mg/dL (H)).   Medical History: Past Medical History:  Diagnosis Date  . ANEMIA 05/28/2010  . AORTIC VALVE REPLACEMENT, HX OF 03/11/2010   Mechanical prosthesis  . BIPOLAR AFFECTIVE DISORDER 03/11/2010  . Depression   . Diverticulitis 12/18/2010  . FIBROIDS, UTERUS 03/11/2010  . Gout 09/04/2016  . Grave's disease 8-12  . Hyperlipidemia associated with type 2 diabetes mellitus ([place]) 06/05/2016  . Hypertension   . Low back pain 11/02/2017  . Migraine 06/05/2016  . Mixed hyperlipidemia 03/11/2010  . Obesity   . Skin cancer   . Syncope 08/22/2019  . UTI (lower urinary tract infection) 08/14/2011    Assessment: 48 yoF on warfarin for hx mAVR + AFib admitted with fall and found to have L thigh hematoma. Warfarin initially held on admit, now to restart, INR therapeutic at 2.6.  *Home warfarin dose = 2.5mg  Mon/Fri, 5mg  AODs  Goal of Therapy:  INR 2.5-3.5   Plan:  Warfarin 5mg  PO x1 tonight Daily protime Watch hematoma    Arrie Senate, PharmD, BCPS Clinical Pharmacist [phone] Please check AMION for all [place] numbers 03/28/2020

## 2020-03-28 NOTE — Progress Notes (Signed)
Progress Note    [name]  YYT:[id number] DOB: 1951-03-19  DOA: 03/27/2020 PCP: Mosie Lukes, MD    Brief Narrative:     Medical records reviewed and are as summarized below:  [name] is an 69 y.o. female  Here with UTI and weakness concerning for CVA.  MRI negative.    Assessment/Plan:   Principal Problem:   UTI (urinary tract infection) Active Problems:   Anemia   Atrial fibrillation (HCC)   Essential hypertension, benign   Recurrent falls  Suspected UTI UA with positive nitrite, moderate amount of leukocytes, 21-50 WBCs, and rare bacteria.  No fever, leukocytosis, or signs of sepsis. -Ceftriaxone -urine culture pending but most likely sent after IV abx started  Left-sided weakness and word finding difficulty Recurrent falls Head CT negative for acute intracranial abnormality.  Neuro exam abnormal with slight left upper and lower extremity weakness, slight left pronator drift, and deviation of the tongue to the right. -MRI brain shows nothing acute but does show old Chronic hemorrhagic lacunar infarct within the right basal ganglia.: fall and left side weakness could be recrudesence from old strokes   Acute metabolic encephalopathy -resolved -in setting of UTI being treated  Normocytic anemia Hemoglobin 10.4, MCV 93.7.  On previous labs, hemoglobin was in the 12-13 range.  Patient is not endorsing any symptoms of acute GI bleed.  Does have a large hematoma in her left hip region. -monitor  History of aortic insufficiency status post mechanical valve replacement INR checked at PCPs office today was 2.5. -resume coumadin  Atrial fibrillation/flutter Stable.  Has a pacemaker. -Continue amiodarone.   -resume coumadin  Mildly elevated transaminases AST 68 and ALT 61.  No elevation of alk phos or T bili.  No complaints of abdominal pain, nausea, vomiting. -Right upper quadrant ultrasound unremarkable  Chronic combined systolic and diastolic  CHF No signs of volume overload. -Hold Lasix   Non-insulin-dependent type 2 diabetes -SSI -hgb: 5.3  Hypertension -resume home meds  Hyperlipidemia -Continue fenofibrate, Crestor  GERD -Continue PPI   Depression, bipolar disorder -Continue home medications  obesity Body mass index is 32.62 kg/m.   Family Communication/Anticipated D/C date and plan/Code Status   DVT prophylaxis: Lovenox ordered. Code Status: Full Code.   Disposition Plan: Status is: Inpatient  Remains inpatient appropriate because:Inpatient level of care appropriate due to severity of illness   Dispo: The patient is from: Home              Anticipated d/c is to: Home              Anticipated d/c date is: 2 days              Patient currently is not medically stable to d/c.         Medical Consultants:    neuro   Subjective:   No current complaints, eating lunch in bed  Objective:    Vitals:   03/28/20 0800 03/28/20 0834 03/28/20 1151 03/28/20 1200  BP: (!) 100/53  108/84   Pulse: 68 67 74   Resp: $Remo'19 20 15   'yQiUc$ Temp: 97.7 F (36.5 C)   99.6 F (37.6 C)  TempSrc:    Oral  SpO2:  96%  98%  Weight:      Height:        Intake/Output Summary (Last 24 hours) at 03/28/2020 1555 Last data filed at 03/28/2020 1300 Gross per 24 hour  Intake 579.96 ml  Output 300  ml  Net 279.96 ml   Filed Weights   03/27/20 1728 03/28/20 0125  Weight: 97.5 kg 97.3 kg    Exam:  General: Appearance:    Obese female in no acute distress     Lungs:     Clear to auscultation bilaterally, respirations unlabored  Heart:    Normal heart rate.  No murmurs, rubs, or gallops.    MS:   All extremities are intact.  Left hip hematoma  Neurologic:   Awake, alert, oriented x 3. No apparent focal neurological           defect.     Data Reviewed:   I have personally reviewed following labs and imaging studies:  Labs: Labs show the following:   Basic Metabolic Panel: Recent Labs  Lab  03/27/20 1155 03/27/20 1757  NA 140 139  K 4.4 4.0  CL 102 103  CO2 26 24  GLUCOSE 83 92  BUN 17 15  CREATININE 1.47* 1.30*  CALCIUM 9.5 8.7*   GFR Estimated Creatinine Clearance: 49.8 mL/min (A) (by C-G formula based on SCr of 1.3 mg/dL (H)). Liver Function Tests: Recent Labs  Lab 03/27/20 1155 03/27/20 1757  AST 61* 68*  ALT 63* 61*  ALKPHOS  --  26*  BILITOT 0.6 0.5  PROT 7.0 6.3*  ALBUMIN  --  3.9   No results for input(s): LIPASE, AMYLASE in the last 168 hours. No results for input(s): AMMONIA in the last 168 hours. Coagulation profile Recent Labs  Lab 03/27/20 1434 03/28/20 0422  INR 2.5 2.6*    CBC: Recent Labs  Lab 03/27/20 1155 03/28/20 0422  WBC 9.0 6.2  HGB 10.4* 9.5*  HCT 32.5* 30.3*  MCV 93.7 97.1  PLT 264 219   Cardiac Enzymes: No results for input(s): CKTOTAL, CKMB, CKMBINDEX, TROPONINI in the last 168 hours. BNP (last 3 results) No results for input(s): PROBNP in the last 8760 hours. CBG: Recent Labs  Lab 03/27/20 1727 03/28/20 0002 03/28/20 0632 03/28/20 1151  GLUCAP 110* 110* 101* 88   D-Dimer: No results for input(s): DDIMER in the last 72 hours. Hgb A1c: Recent Labs    03/28/20 0422  HGBA1C 5.3   Lipid Profile: No results for input(s): CHOL, HDL, LDLCALC, TRIG, CHOLHDL, LDLDIRECT in the last 72 hours. Thyroid function studies: No results for input(s): TSH, T4TOTAL, T3FREE, THYROIDAB in the last 72 hours.  Invalid input(s): FREET3 Anemia work up: Recent Labs    03/28/20 0422  VITAMINB12 162*  FOLATE 21.1  FERRITIN 123  TIBC 365  IRON 48  RETICCTPCT 1.7   Sepsis Labs: Recent Labs  Lab 03/27/20 1155 03/28/20 0422  WBC 9.0 6.2    Microbiology Recent Results (from the past 240 hour(s))  Respiratory Panel by RT PCR (Flu A&B, Covid) - Nasopharyngeal Swab     Status: None   Collection Time: 03/27/20  7:35 PM   Specimen: Nasopharyngeal Swab  Result Value Ref Range Status   SARS Coronavirus 2 by RT PCR  NEGATIVE NEGATIVE Final    Comment: (NOTE) SARS-CoV-2 target nucleic acids are NOT DETECTED.  The SARS-CoV-2 RNA is generally detectable in upper respiratoy specimens during the acute phase of infection. The lowest concentration of SARS-CoV-2 viral copies this assay can detect is 131 copies/mL. A negative result does not preclude SARS-Cov-2 infection and should not be used as the sole basis for treatment or other patient management decisions. A negative result may occur with  improper specimen collection/handling, submission of specimen other than  nasopharyngeal swab, presence of viral mutation(s) within the areas targeted by this assay, and inadequate number of viral copies (<131 copies/mL). A negative result must be combined with clinical observations, patient history, and epidemiological information. The expected result is Negative.  Fact Sheet for Patients:  PinkCheek.be  Fact Sheet for Healthcare Providers:  GravelBags.it  This test is no t yet approved or cleared by the [place] FDA and  has been authorized for detection and/or diagnosis of SARS-CoV-2 by FDA under an Emergency Use Authorization (EUA). This EUA will remain  in effect (meaning this test can be used) for the duration of the COVID-19 declaration under Section 564(b)(1) of the Act, 21 U.S.C. section 360bbb-3(b)(1), unless the authorization is terminated or revoked sooner.     Influenza A by PCR NEGATIVE NEGATIVE Final   Influenza B by PCR NEGATIVE NEGATIVE Final    Comment: (NOTE) The Xpert Xpress SARS-CoV-2/FLU/RSV assay is intended as an aid in  the diagnosis of influenza from Nasopharyngeal swab specimens and  should not be used as a sole basis for treatment. Nasal washings and  aspirates are unacceptable for Xpert Xpress SARS-CoV-2/FLU/RSV  testing.  Fact Sheet for Patients: PinkCheek.be  Fact Sheet for  Healthcare Providers: GravelBags.it  This test is not yet approved or cleared by the [place] FDA and  has been authorized for detection and/or diagnosis of SARS-CoV-2 by  FDA under an Emergency Use Authorization (EUA). This EUA will remain  in effect (meaning this test can be used) for the duration of the  Covid-19 declaration under Section 564(b)(1) of the Act, 21  U.S.C. section 360bbb-3(b)(1), unless the authorization is  terminated or revoked. Performed at [facility], [place] [address] [place], [place] [address]   MRSA PCR Screening     Status: None   Collection Time: 03/28/20  2:22 AM   Specimen: Nasal Mucosa; Nasopharyngeal  Result Value Ref Range Status   MRSA by PCR NEGATIVE NEGATIVE Final    Comment:        The GeneXpert MRSA Assay (FDA approved for NASAL specimens only), is one component of a comprehensive MRSA colonization surveillance program. It is not intended to diagnose MRSA infection nor to guide or monitor treatment for MRSA infections. Performed at [facility], [place] [address], [place], [place] [address]     Procedures and diagnostic studies:  CT Head Wo Contrast  Result Date: 03/27/2020 CLINICAL DATA:  Neuro deficit, acute, stroke suspected Weakness. EXAM: CT HEAD WITHOUT CONTRAST TECHNIQUE: Contiguous axial images were obtained from the base of the skull through the vertex without intravenous contrast. COMPARISON:  Head CT 08/21/2019 FINDINGS: Brain: Stable degree of atrophy and chronic small vessel ischemia. No intracranial hemorrhage, mass effect, or midline shift. No hydrocephalus. The basilar cisterns are patent. No evidence of territorial infarct or acute ischemia. No extra-axial or intracranial fluid collection. Vascular: Atherosclerosis of skullbase vasculature without hyperdense vessel or abnormal calcification. Skull: No fracture or focal lesion. Sinuses/Orbits: Paranasal sinuses and mastoid air cells are  clear. The visualized orbits are unremarkable. Other: None. IMPRESSION: 1. No acute intracranial abnormality. 2. Stable atrophy and chronic small vessel ischemia. Electronically Signed   By: Keith Rake M.D.   On: 03/27/2020 18:45   MR ANGIO HEAD WO CONTRAST  Result Date: 03/28/2020 CLINICAL DATA:  Neuro deficit, acute, stroke suspected. Additional history provided: Left-sided weakness, word finding difficulty, multiple falls. EXAM: MRI HEAD WITHOUT CONTRAST MRA HEAD WITHOUT CONTRAST TECHNIQUE: Multiplanar, multiecho pulse sequences of the brain  and surrounding structures were obtained without intravenous contrast. Angiographic images of the head were obtained using MRA technique without contrast. COMPARISON:  Noncontrast head CT 03/27/2020. CT angiogram head/neck 08/21/2019. FINDINGS: MRI HEAD FINDINGS Mild-to-moderate intermittent motion degradation. Brain: Mild generalized cerebral atrophy. Chronic hemorrhagic lacunar infarct within the right lentiform nucleus. Mild multifocal T2/FLAIR hyperintensity within the cerebral white matter is nonspecific, but compatible with chronic small vessel ischemic disease. This includes a chronic lacunar infarct within the left parietal white matter (series 7, image 15). There is no acute infarct. No evidence of intracranial mass. No extra-axial fluid collection. No midline shift. Vascular: Reported below. Skull and upper cervical spine: No focal marrow lesion. Sinuses/Orbits: Visualized orbits show no acute finding. Mild ethmoid sinus mucosal thickening. No significant mastoid effusion. MRA HEAD FINDINGS The examination is moderate to severely motion degraded, significantly limiting evaluation for stenoses and for intracranial aneurysms. No intracranial large vessel occlusion. Apparent high-grade stenosis within the V4 right vertebral artery (series 1203, image 6). Within described limitations, no intracranial aneurysm is identified. IMPRESSION: MRI head: 1.  Mild-to-moderate intermittent motion degradation. 2. No evidence of acute intracranial abnormality, including acute infarction. 3. Chronic hemorrhagic lacunar infarct within the right basal ganglia. 4. Chronic lacunar infarct within the left parietal white matter. 5. Background mild generalized cerebral atrophy and chronic small vessel ischemic disease. MRA head: 1. Moderate to severely motion degraded examination, significantly limiting evaluation for intracranial arterial stenoses. 2. No intracranial large vessel occlusion. 3. Apparent high-grade stenosis within the V4 right vertebral artery. Electronically Signed   By: Kellie Simmering DO   On: 03/28/2020 11:18   MR BRAIN WO CONTRAST  Result Date: 03/28/2020 CLINICAL DATA:  Neuro deficit, acute, stroke suspected. Additional history provided: Left-sided weakness, word finding difficulty, multiple falls. EXAM: MRI HEAD WITHOUT CONTRAST MRA HEAD WITHOUT CONTRAST TECHNIQUE: Multiplanar, multiecho pulse sequences of the brain and surrounding structures were obtained without intravenous contrast. Angiographic images of the head were obtained using MRA technique without contrast. COMPARISON:  Noncontrast head CT 03/27/2020. CT angiogram head/neck 08/21/2019. FINDINGS: MRI HEAD FINDINGS Mild-to-moderate intermittent motion degradation. Brain: Mild generalized cerebral atrophy. Chronic hemorrhagic lacunar infarct within the right lentiform nucleus. Mild multifocal T2/FLAIR hyperintensity within the cerebral white matter is nonspecific, but compatible with chronic small vessel ischemic disease. This includes a chronic lacunar infarct within the left parietal white matter (series 7, image 15). There is no acute infarct. No evidence of intracranial mass. No extra-axial fluid collection. No midline shift. Vascular: Reported below. Skull and upper cervical spine: No focal marrow lesion. Sinuses/Orbits: Visualized orbits show no acute finding. Mild ethmoid sinus mucosal  thickening. No significant mastoid effusion. MRA HEAD FINDINGS The examination is moderate to severely motion degraded, significantly limiting evaluation for stenoses and for intracranial aneurysms. No intracranial large vessel occlusion. Apparent high-grade stenosis within the V4 right vertebral artery (series 1203, image 6). Within described limitations, no intracranial aneurysm is identified. IMPRESSION: MRI head: 1. Mild-to-moderate intermittent motion degradation. 2. No evidence of acute intracranial abnormality, including acute infarction. 3. Chronic hemorrhagic lacunar infarct within the right basal ganglia. 4. Chronic lacunar infarct within the left parietal white matter. 5. Background mild generalized cerebral atrophy and chronic small vessel ischemic disease. MRA head: 1. Moderate to severely motion degraded examination, significantly limiting evaluation for intracranial arterial stenoses. 2. No intracranial large vessel occlusion. 3. Apparent high-grade stenosis within the V4 right vertebral artery. Electronically Signed   By: Kellie Simmering DO   On: 03/28/2020 11:18   DG CHEST PORT  1 VIEW  Result Date: 03/28/2020 CLINICAL DATA:  Weakness and cough over the last day. EXAM: PORTABLE CHEST 1 VIEW COMPARISON:  01/09/2020 FINDINGS: Previous median sternotomy and valve replacement, probably aortic. Chronic cardiomegaly. Dual lead pacemaker in place. The lungs are clear. The vascularity is normal. No visible effusion. Surgical clips right axilla. IMPRESSION: No active disease. Previous valve replacement. Dual lead pacemaker. Electronically Signed   By: Nelson Chimes M.D.   On: 03/28/2020 14:11   DG HIP UNILAT WITH PELVIS 2-3 VIEWS LEFT  Result Date: 03/27/2020 CLINICAL DATA:  Acute left hip pain after multiple falls. EXAM: DG HIP (WITH OR WITHOUT PELVIS) 2-3V LEFT COMPARISON:  None. FINDINGS: There is no evidence of hip fracture or dislocation. There is no evidence of arthropathy or other focal bone  abnormality. IMPRESSION: Negative. Electronically Signed   By: Marijo Conception M.D.   On: 03/27/2020 18:28   VAS US CAROTID  Result Date: 03/28/2020 Carotid Arterial Duplex Study Indications:       TIA. Risk Factors:      Hypertension, hyperlipidemia, Diabetes. Comparison Study:  no prior Performing Technologist: Abram Sander RVS  Examination Guidelines: A complete evaluation includes B-mode imaging, spectral Doppler, color Doppler, and power Doppler as needed of all accessible portions of each vessel. Bilateral testing is considered an integral part of a complete examination. Limited examinations for reoccurring indications may be performed as noted.  Right Carotid Findings: +----------+--------+--------+--------+------------------+--------+           PSV cm/sEDV cm/sStenosisPlaque DescriptionComments +----------+--------+--------+--------+------------------+--------+ CCA Prox  69      13              heterogenous               +----------+--------+--------+--------+------------------+--------+ CCA Distal73      17              heterogenous               +----------+--------+--------+--------+------------------+--------+ ICA Prox  84      30      1-39%   heterogenous               +----------+--------+--------+--------+------------------+--------+ ICA Distal127     40                                         +----------+--------+--------+--------+------------------+--------+ ECA       75      6                                          +----------+--------+--------+--------+------------------+--------+ +----------+--------+-------+--------+-------------------+           PSV cm/sEDV cmsDescribeArm Pressure (mmHG) +----------+--------+-------+--------+-------------------+ [id number]                                         +----------+--------+-------+--------+-------------------+ +---------+--------+--+--------+-+---------+ VertebralPSV cm/s40EDV  cm/s7Antegrade +---------+--------+--+--------+-+---------+  Left Carotid Findings: +----------+--------+--------+--------+------------------+--------+           PSV cm/sEDV cm/sStenosisPlaque DescriptionComments +----------+--------+--------+--------+------------------+--------+ CCA Prox  99      14              heterogenous               +----------+--------+--------+--------+------------------+--------+ CCA  Distal75      17              heterogenous               +----------+--------+--------+--------+------------------+--------+ ICA Prox  146     43      1-39%   heterogenous               +----------+--------+--------+--------+------------------+--------+ ICA Distal93      23                                         +----------+--------+--------+--------+------------------+--------+ ECA       85      9                                          +----------+--------+--------+--------+------------------+--------+ +----------+--------+--------+--------+-------------------+           PSV cm/sEDV cm/sDescribeArm Pressure (mmHG) +----------+--------+--------+--------+-------------------+ [id number]                                         +----------+--------+--------+--------+-------------------+ +---------+--------+--+--------+--+---------+ VertebralPSV cm/s66EDV cm/s21Antegrade +---------+--------+--+--------+--+---------+   Summary: Right Carotid: Velocities in the right ICA are consistent with a 1-39% stenosis. Left Carotid: Velocities in the left ICA are consistent with a 1-39% stenosis. Vertebrals: Bilateral vertebral arteries demonstrate antegrade flow. *See table(s) above for measurements and observations.     Preliminary    US Abdomen Limited RUQ (LIVER/GB)  Result Date: 03/27/2020 CLINICAL DATA:  Elevated liver function tests. Status post cholecystectomy 30 years ago per patient. EXAM: ULTRASOUND ABDOMEN LIMITED RIGHT UPPER QUADRANT  COMPARISON:  CT abdomen pelvis 10/16/2015. FINDINGS: Gallbladder: Post cholecystectomy. Common bile duct: Diameter: 8 mm Liver: No focal lesion identified. Within normal limits in parenchymal echogenicity. Portal vein is patent on color Doppler imaging with normal direction of blood flow towards the liver. Other: Visualized right kidney demonstrates no hydronephrosis. IMPRESSION: Unremarkable right upper quadrant ultrasound in a patient that is status post cholecystectomy. Electronically Signed   By: Tish Frederickson M.D.   On: 03/27/2020 23:53    Medications:   . amiodarone  200 mg Oral BID  . aspirin EC  81 mg Oral Daily  . fenofibrate  160 mg Oral Daily  . [START ON 03/29/2020] influenza vaccine adjuvanted  0.5 mL Intramuscular Tomorrow-1000  . insulin aspart  0-5 Units Subcutaneous QHS  . insulin aspart  0-9 Units Subcutaneous TID WC  . lamoTRIgine  100 mg Oral Daily  . lamoTRIgine  200 mg Oral QHS  . OXcarbazepine  150 mg Oral BID  . pantoprazole  20 mg Oral BH-qamhs  . QUEtiapine  600 mg Oral QPM  . rOPINIRole  0.5 mg Oral QHS  . rosuvastatin  40 mg Oral Daily  . venlafaxine XR  75 mg Oral Q breakfast  . [START ON 03/29/2020] vitamin B-12  1,000 mcg Oral Daily  . warfarin  5 mg Oral ONCE-1600  . Warfarin - Pharmacist Dosing Inpatient   Does not apply q1600   Continuous Infusions: . cefTRIAXone (ROCEPHIN)  IV       LOS: 0 days   Joseph Art  [facility]   How to contact the [facility] Attending or Consulting provider 7A -  7P or covering provider during after hours [place], for this patient?  1. Check the care team in [facility] and look for a) attending/consulting [facility] provider listed and b) the [facility] team listed 2. Log into [email or web address] and use [place]'s universal password to access. If you do not have the password, please contact the hospital operator. 3. Locate the [facility] provider you are looking for under [facility] and page to a number that you can be directly  reached. 4. If you still have difficulty reaching the provider, please page the [facility] (Director on Call) for the Hospitalists listed on amion for assistance.  03/28/2020, 3:55 PM

## 2020-03-28 NOTE — Progress Notes (Signed)
PT Cancellation Note  Patient Details Name: [name] MRN: [id number] DOB: 01-31-1951   Cancelled Treatment:    Reason Eval/Treat Not Completed: (P) Patient at procedure or test/unavailable Pt is off floor for MRI. PT will follow back for Evaluation as able.  Jaeley Wiker B. Migdalia Dk PT, DPT Acute Rehabilitation Services Pager [phone] Office [phone]    [place] 03/28/2020, 9:52 AM

## 2020-03-29 DIAGNOSIS — N1831 Chronic kidney disease, stage 3a: Secondary | ICD-10-CM

## 2020-03-29 DIAGNOSIS — W19XXXA Unspecified fall, initial encounter: Secondary | ICD-10-CM

## 2020-03-29 DIAGNOSIS — D631 Anemia in chronic kidney disease: Secondary | ICD-10-CM

## 2020-03-29 LAB — GLUCOSE, CAPILLARY
Glucose-Capillary: 99 mg/dL (ref 70–99)
Glucose-Capillary: 87 mg/dL (ref 70–99)
Glucose-Capillary: 84 mg/dL (ref 70–99)
Glucose-Capillary: 90 mg/dL (ref 70–99)

## 2020-03-29 LAB — CBC
HCT: 28.8 % — ABNORMAL LOW (ref 36.0–46.0)
Hemoglobin: 9 g/dL — ABNORMAL LOW (ref 12.0–15.0)
MCH: 30.3 pg (ref 26.0–34.0)
MCHC: 31.3 g/dL (ref 30.0–36.0)
MCV: 97 fL (ref 80.0–100.0)
Platelets: 214 10*3/uL (ref 150–400)
RBC: 2.97 MIL/uL — ABNORMAL LOW (ref 3.87–5.11)
RDW: 14.7 % (ref 11.5–15.5)
WBC: 5.6 10*3/uL (ref 4.0–10.5)
nRBC: 0 % (ref 0.0–0.2)

## 2020-03-29 LAB — URINE CULTURE
Culture: <10000 — AB
MICRO NUMBER:: 11148681
SPECIMEN QUALITY:: ADEQUATE

## 2020-03-29 LAB — BASIC METABOLIC PANEL
Anion gap: 10 (ref 5–15)
BUN: 24 mg/dL — ABNORMAL HIGH (ref 8–23)
CO2: 24 mmol/L (ref 22–32)
Calcium: 8.6 mg/dL — ABNORMAL LOW (ref 8.9–10.3)
Chloride: 104 mmol/L (ref 98–111)
Creatinine, Ser: 1.74 mg/dL — ABNORMAL HIGH (ref 0.44–1.00)
GFR, Estimated: 31 mL/min — ABNORMAL LOW (ref >60)
Glucose, Bld: 139 mg/dL — ABNORMAL HIGH (ref 70–99)
Potassium: 3.8 mmol/L (ref 3.5–5.1)
Sodium: 138 mmol/L (ref 135–145)

## 2020-03-29 LAB — LIPID PANEL
Cholesterol: 126 mg/dL (ref 0–200)
HDL: 51 mg/dL (ref >40)
LDL Cholesterol: 45 mg/dL (ref 0–99)
Total CHOL/HDL Ratio: 2.5 RATIO
Triglycerides: 148 mg/dL (ref <150)
VLDL: 30 mg/dL (ref 0–40)

## 2020-03-29 LAB — PROTIME-INR
INR: 2.6 — ABNORMAL HIGH (ref 0.8–1.2)
Prothrombin Time: 26.8 seconds — ABNORMAL HIGH (ref 11.4–15.2)

## 2020-03-29 MED ORDER — WARFARIN SODIUM 5 MG PO TABS
5.0000 mg | ORAL_TABLET | Freq: Once | ORAL | Status: AC
  Administered 2020-03-29: 5 mg via ORAL
  Filled 2020-03-29: qty 1

## 2020-03-29 MED ORDER — FOSFOMYCIN TROMETHAMINE 3 G PO PACK
3.0000 g | PACK | Freq: Once | ORAL | Status: AC
  Administered 2020-03-29: 3 g via ORAL
  Filled 2020-03-29: qty 3

## 2020-03-29 MED ORDER — SODIUM CHLORIDE 0.9 % IV SOLN: INTRAVENOUS | Status: DC

## 2020-03-29 NOTE — Progress Notes (Signed)
Progress Note    [name]  LEX:[id number] DOB: [birth year]  DOA: 03/27/2020 PCP: Mosie Lukes, MD    Brief Narrative:     Medical records reviewed and are as summarized below:  [name] is an 69 y.o. female  Here with UTI and weakness concerning for CVA.  MRI negative.    Assessment/Plan:   Principal Problem:   UTI (urinary tract infection) Active Problems:   Anemia   Atrial fibrillation (HCC)   Essential hypertension, benign   Recurrent falls  Suspected UTI- ESBL on 1st culture UA with positive nitrite, moderate amount of leukocytes, 21-50 WBCs, and rare bacteria.  No fever, leukocytosis, or signs of sepsis. -Ceftriaxone d/c'd in favor of fosphomycin  -1st urine culture esbl; 2nd culture insignificant grwoth  Left-sided weakness and word finding difficulty Recurrent falls Head CT negative for acute intracranial abnormality.  Neuro exam abnormal with slight left upper and lower extremity weakness, slight left pronator drift, and deviation of the tongue to the right. -MRI brain shows nothing acute but does show old Chronic hemorrhagic lacunar infarct within the right basal ganglia.: fall and left side weakness could be recrudesence from old strokes  Acute metabolic encephalopathy -resolved -in setting of UTI being treated  Normocytic anemia -  On previous labs, hemoglobin was in the 12-13 range.  Patient is not endorsing any symptoms of acute GI bleed.  Does have a large hematoma in her left hip region- monitor size of hematoma -monitor -low B12: replete IM and then PO with close outpatient follow up  History of aortic insufficiency status post mechanical valve replacement -resume coumadin  Atrial fibrillation/flutter- permanent  Stable.  Has a pacemaker. -Continue amiodarone.   -resume coumadin  Mildly elevated transaminases AST 68 and ALT 61.  No elevation of alk phos or T bili.  No complaints of abdominal pain, nausea, vomiting. -Right upper  quadrant ultrasound unremarkable  Chronic combined systolic and diastolic CHF No signs of volume overload. -Hold Lasix   Non-insulin-dependent type 2 diabetes -SSI -hgb: 5.3  Hypotension  -holding home meds (lasix/BB) -gentle IVF overnight  AKI -? From hypotension -gentle IVF  Hyperlipidemia -Continue fenofibrate, Crestor  GERD -Continue PPI   Depression, bipolar disorder -Continue home medications  obesity Body mass index is 32.62 kg/m.   Family Communication/Anticipated D/C date and plan/Code Status   DVT prophylaxis: Lovenox ordered. Code Status: Full Code.   Disposition Plan: Status is: Inpatient  Remains inpatient appropriate because:Inpatient level of care appropriate due to severity of illness   Dispo: The patient is from: Home              Anticipated d/c is to: Home              Anticipated d/c date is: 2 days              Patient currently is not medically stable to d/c. needs continued monitoring of Hgb and BP         Medical Consultants:    neuro   Subjective:   No pain in hip  Objective:    Vitals:   03/28/20 1951 03/29/20 0001 03/29/20 0342 03/29/20 0800  BP: 102/65 (!) 105/57 (!) 93/50 (!) 95/47  Pulse: 65 62 66 66  Resp: $Remo'18 13 15 16  'SkwaM$ Temp: 98.2 F (36.8 C) 98 F (36.7 C) 97.9 F (36.6 C) (!) 97.5 F (36.4 C)  TempSrc: Oral Oral Oral Oral  SpO2: 96% 95% 95% 94%  Weight:  Height:        Intake/Output Summary (Last 24 hours) at 03/29/2020 1505 Last data filed at 03/29/2020 1300 Gross per 24 hour  Intake 460 ml  Output -  Net 460 ml   Filed Weights   03/27/20 1728 03/28/20 0125  Weight: 97.5 kg 97.3 kg    Exam:   General: Appearance:    Obese female in no acute distress  Eyes:    PERRL, conjunctiva/corneas clear, EOM's intact       Lungs:     Clear to auscultation bilaterally, respirations unlabored  Heart:    Normal heart rate. irr. No murmurs, rubs, or gallops.    MS:   All extremities are  intact.  Hematoma in left hip  Neurologic:   Awake, alert, pleasant and cooperative    Data Reviewed:   I have personally reviewed following labs and imaging studies:  Labs: Labs show the following:   Basic Metabolic Panel: Recent Labs  Lab 03/27/20 1155 03/27/20 1155 03/27/20 1757 03/29/20 0019  NA 140  --  139 138  K 4.4   < > 4.0 3.8  CL 102  --  103 104  CO2 26  --  24 24  GLUCOSE 83  --  92 139*  BUN 17  --  15 24*  CREATININE 1.47*  --  1.30* 1.74*  CALCIUM 9.5  --  8.7* 8.6*   < > = values in this interval not displayed.   GFR Estimated Creatinine Clearance: 37.2 mL/min (A) (by C-G formula based on SCr of 1.74 mg/dL (H)). Liver Function Tests: Recent Labs  Lab 03/27/20 1155 03/27/20 1757  AST 61* 68*  ALT 63* 61*  ALKPHOS  --  26*  BILITOT 0.6 0.5  PROT 7.0 6.3*  ALBUMIN  --  3.9   No results for input(s): LIPASE, AMYLASE in the last 168 hours. No results for input(s): AMMONIA in the last 168 hours. Coagulation profile Recent Labs  Lab 03/27/20 1434 03/28/20 0422 03/29/20 0019  INR 2.5 2.6* 2.6*    CBC: Recent Labs  Lab 03/27/20 1155 03/28/20 0422 03/29/20 0019  WBC 9.0 6.2 5.6  HGB 10.4* 9.5* 9.0*  HCT 32.5* 30.3* 28.8*  MCV 93.7 97.1 97.0  PLT 264 219 214   Cardiac Enzymes: No results for input(s): CKTOTAL, CKMB, CKMBINDEX, TROPONINI in the last 168 hours. BNP (last 3 results) No results for input(s): PROBNP in the last 8760 hours. CBG: Recent Labs  Lab 03/28/20 1151 03/28/20 1631 03/28/20 2052 03/29/20 0601 03/29/20 1122  GLUCAP 88 109* 113* 99 87   D-Dimer: No results for input(s): DDIMER in the last 72 hours. Hgb A1c: Recent Labs    03/28/20 0422  HGBA1C 5.3   Lipid Profile: Recent Labs    03/29/20 0019  CHOL 126  HDL 51  LDLCALC 45  TRIG 148  CHOLHDL 2.5   Thyroid function studies: No results for input(s): TSH, T4TOTAL, T3FREE, THYROIDAB in the last 72 hours.  Invalid input(s): FREET3 Anemia work up:  Recent Labs    03/28/20 0422  VITAMINB12 162*  FOLATE 21.1  FERRITIN 123  TIBC 365  IRON 48  RETICCTPCT 1.7   Sepsis Labs: Recent Labs  Lab 03/27/20 1155 03/28/20 0422 03/29/20 0019  WBC 9.0 6.2 5.6    Microbiology Recent Results (from the past 240 hour(s))  Urine Culture     Status: Abnormal   Collection Time: 03/27/20 11:55 AM   Specimen: Urine  Result Value Ref Range Status  MICRO NUMBER: [id number]  Final   SPECIMEN QUALITY: Adequate  Final   Sample Source NOT GIVEN  Final   STATUS: FINAL  Final   ISOLATE 1: ESBL Escherichia coli (A)  Final    Comment: Greater than 100,000 CFU/mL of Escherichia coli (ESBL) ESBL RESULT:        The organism has been confirmed as an ESBL producer.      Susceptibility   Esbl escherichia coli - URINE CULTURE, REFLEX    AMOX/CLAVULANIC 16 Intermediate     AMPICILLIN* >=32 Resistant      * Extended spectrum beta-lactamase (ESBL) producingorganisms demonstrate decreased activity withpenicillins, cephalosporins and aztreonam.    AMPICILLIN/SULBACTAM >=32 Resistant     CEFAZOLIN* >=64 Resistant      * Extended spectrum beta-lactamase (ESBL) producingorganisms demonstrate decreased activity withpenicillins, cephalosporins and aztreonam.For uncomplicated UTI caused by E. coli,K. pneumoniae or P. mirabilis: Cefazolin issusceptible if MIC <32 mcg/mL and predictssusceptible to the oral agents cefaclor, cefdinir,cefpodoxime, cefprozil, cefuroxime, cephalexinand loracarbef.    CEFEPIME >=64 Resistant     CEFTRIAXONE >=64 Resistant     CIPROFLOXACIN >=4 Resistant     LEVOFLOXACIN >=8 Resistant     ERTAPENEM <=0.5 Sensitive     IMIPENEM <=0.25 Sensitive     NITROFURANTOIN <=16 Sensitive     PIP/TAZO 8 Sensitive     TRIMETH/SULFA* >=320 Resistant      * Extended spectrum beta-lactamase (ESBL) producingorganisms demonstrate decreased activity withpenicillins, cephalosporins and aztreonam.For uncomplicated UTI caused by E. coli,K. pneumoniae or P.  mirabilis: Cefazolin issusceptible if MIC <32 mcg/mL and predictssusceptible to the oral agents cefaclor, cefdinir,cefpodoxime, cefprozil, cefuroxime, cephalexinand loracarbef.Legend:S = Susceptible  I = IntermediateR = Resistant  NS = Not susceptible* = Not tested  NR = Not reported**NN = See antimicrobic comments  Respiratory Panel by RT PCR (Flu A&B, Covid) - Nasopharyngeal Swab     Status: None   Collection Time: 03/27/20  7:35 PM   Specimen: Nasopharyngeal Swab  Result Value Ref Range Status   SARS Coronavirus 2 by RT PCR NEGATIVE NEGATIVE Final    Comment: (NOTE) SARS-CoV-2 target nucleic acids are NOT DETECTED.  The SARS-CoV-2 RNA is generally detectable in upper respiratoy specimens during the acute phase of infection. The lowest concentration of SARS-CoV-2 viral copies this assay can detect is 131 copies/mL. A negative result does not preclude SARS-Cov-2 infection and should not be used as the sole basis for treatment or other patient management decisions. A negative result may occur with  improper specimen collection/handling, submission of specimen other than nasopharyngeal swab, presence of viral mutation(s) within the areas targeted by this assay, and inadequate number of viral copies (<131 copies/mL). A negative result must be combined with clinical observations, patient history, and epidemiological information. The expected result is Negative.  Fact Sheet for Patients:  PinkCheek.be  Fact Sheet for Healthcare Providers:  GravelBags.it  This test is no t yet approved or cleared by the [place] FDA and  has been authorized for detection and/or diagnosis of SARS-CoV-2 by FDA under an Emergency Use Authorization (EUA). This EUA will remain  in effect (meaning this test can be used) for the duration of the COVID-19 declaration under Section 564(b)(1) of the Act, 21 U.S.C. section 360bbb-3(b)(1), unless the  authorization is terminated or revoked sooner.     Influenza A by PCR NEGATIVE NEGATIVE Final   Influenza B by PCR NEGATIVE NEGATIVE Final    Comment: (NOTE) The Xpert Xpress SARS-CoV-2/FLU/RSV assay is intended as an aid in  the diagnosis of influenza from Nasopharyngeal swab specimens and  should not be used as a sole basis for treatment. Nasal washings and  aspirates are unacceptable for Xpert Xpress SARS-CoV-2/FLU/RSV  testing.  Fact Sheet for Patients: https://www.moore.com/  Fact Sheet for Healthcare Providers: https://www.young.biz/  This test is not yet approved or cleared by the [place] FDA and  has been authorized for detection and/or diagnosis of SARS-CoV-2 by  FDA under an Emergency Use Authorization (EUA). This EUA will remain  in effect (meaning this test can be used) for the duration of the  Covid-19 declaration under Section 564(b)(1) of the Act, 21  U.S.C. section 360bbb-3(b)(1), unless the authorization is  terminated or revoked. Performed at [facility], [address], [place], [place] [address]   MRSA PCR Screening     Status: None   Collection Time: 03/28/20  2:22 AM   Specimen: Nasal Mucosa; Nasopharyngeal  Result Value Ref Range Status   MRSA by PCR NEGATIVE NEGATIVE Final    Comment:        The GeneXpert MRSA Assay (FDA approved for NASAL specimens only), is one component of a comprehensive MRSA colonization surveillance program. It is not intended to diagnose MRSA infection nor to guide or monitor treatment for MRSA infections. Performed at [facility], [address] [place], [place] [address]   Culture, Urine     Status: Abnormal   Collection Time: 03/28/20  1:15 PM   Specimen: Urine, Clean Catch  Result Value Ref Range Status   Specimen Description URINE, CLEAN CATCH  Final   Special Requests NONE  Final   Culture (A)  Final    <10,000 COLONIES/mL INSIGNIFICANT GROWTH  Performed at [facility], [address] [place], [place] [address]    Report Status 03/29/2020 FINAL  Final    Procedures and diagnostic studies:  CT Head Wo Contrast  Result Date: 03/27/2020 CLINICAL DATA:  Neuro deficit, acute, stroke suspected Weakness. EXAM: CT HEAD WITHOUT CONTRAST TECHNIQUE: Contiguous axial images were obtained from the base of the skull through the vertex without intravenous contrast. COMPARISON:  Head CT 08/21/2019 FINDINGS: Brain: Stable degree of atrophy and chronic small vessel ischemia. No intracranial hemorrhage, mass effect, or midline shift. No hydrocephalus. The basilar cisterns are patent. No evidence of territorial infarct or acute ischemia. No extra-axial or intracranial fluid collection. Vascular: Atherosclerosis of skullbase vasculature without hyperdense vessel or abnormal calcification. Skull: No fracture or focal lesion. Sinuses/Orbits: Paranasal sinuses and mastoid air cells are clear. The visualized orbits are unremarkable. Other: None. IMPRESSION: 1. No acute intracranial abnormality. 2. Stable atrophy and chronic small vessel ischemia. Electronically Signed   By: Narda Rutherford M.D.   On: 03/27/2020 18:45   MR ANGIO HEAD WO CONTRAST  Result Date: 03/28/2020 CLINICAL DATA:  Neuro deficit, acute, stroke suspected. Additional history provided: Left-sided weakness, word finding difficulty, multiple falls. EXAM: MRI HEAD WITHOUT CONTRAST MRA HEAD WITHOUT CONTRAST TECHNIQUE: Multiplanar, multiecho pulse sequences of the brain and surrounding structures were obtained without intravenous contrast. Angiographic images of the head were obtained using MRA technique without contrast. COMPARISON:  Noncontrast head CT 03/27/2020. CT angiogram head/neck 08/21/2019. FINDINGS: MRI HEAD FINDINGS Mild-to-moderate intermittent motion degradation. Brain: Mild generalized cerebral atrophy. Chronic hemorrhagic lacunar infarct within the right lentiform nucleus. Mild  multifocal T2/FLAIR hyperintensity within the cerebral white matter is nonspecific, but compatible with chronic small vessel ischemic disease. This includes a chronic lacunar infarct within the left parietal white matter (series  7, image 15). There is no acute infarct. No evidence of intracranial mass. No extra-axial fluid collection. No midline shift. Vascular: Reported below. Skull and upper cervical spine: No focal marrow lesion. Sinuses/Orbits: Visualized orbits show no acute finding. Mild ethmoid sinus mucosal thickening. No significant mastoid effusion. MRA HEAD FINDINGS The examination is moderate to severely motion degraded, significantly limiting evaluation for stenoses and for intracranial aneurysms. No intracranial large vessel occlusion. Apparent high-grade stenosis within the V4 right vertebral artery (series 1203, image 6). Within described limitations, no intracranial aneurysm is identified. IMPRESSION: MRI head: 1. Mild-to-moderate intermittent motion degradation. 2. No evidence of acute intracranial abnormality, including acute infarction. 3. Chronic hemorrhagic lacunar infarct within the right basal ganglia. 4. Chronic lacunar infarct within the left parietal white matter. 5. Background mild generalized cerebral atrophy and chronic small vessel ischemic disease. MRA head: 1. Moderate to severely motion degraded examination, significantly limiting evaluation for intracranial arterial stenoses. 2. No intracranial large vessel occlusion. 3. Apparent high-grade stenosis within the V4 right vertebral artery. Electronically Signed   By: Kellie Simmering DO   On: 03/28/2020 11:18   MR BRAIN WO CONTRAST  Result Date: 03/28/2020 CLINICAL DATA:  Neuro deficit, acute, stroke suspected. Additional history provided: Left-sided weakness, word finding difficulty, multiple falls. EXAM: MRI HEAD WITHOUT CONTRAST MRA HEAD WITHOUT CONTRAST TECHNIQUE: Multiplanar, multiecho pulse sequences of the brain and surrounding  structures were obtained without intravenous contrast. Angiographic images of the head were obtained using MRA technique without contrast. COMPARISON:  Noncontrast head CT 03/27/2020. CT angiogram head/neck 08/21/2019. FINDINGS: MRI HEAD FINDINGS Mild-to-moderate intermittent motion degradation. Brain: Mild generalized cerebral atrophy. Chronic hemorrhagic lacunar infarct within the right lentiform nucleus. Mild multifocal T2/FLAIR hyperintensity within the cerebral white matter is nonspecific, but compatible with chronic small vessel ischemic disease. This includes a chronic lacunar infarct within the left parietal white matter (series 7, image 15). There is no acute infarct. No evidence of intracranial mass. No extra-axial fluid collection. No midline shift. Vascular: Reported below. Skull and upper cervical spine: No focal marrow lesion. Sinuses/Orbits: Visualized orbits show no acute finding. Mild ethmoid sinus mucosal thickening. No significant mastoid effusion. MRA HEAD FINDINGS The examination is moderate to severely motion degraded, significantly limiting evaluation for stenoses and for intracranial aneurysms. No intracranial large vessel occlusion. Apparent high-grade stenosis within the V4 right vertebral artery (series 1203, image 6). Within described limitations, no intracranial aneurysm is identified. IMPRESSION: MRI head: 1. Mild-to-moderate intermittent motion degradation. 2. No evidence of acute intracranial abnormality, including acute infarction. 3. Chronic hemorrhagic lacunar infarct within the right basal ganglia. 4. Chronic lacunar infarct within the left parietal white matter. 5. Background mild generalized cerebral atrophy and chronic small vessel ischemic disease. MRA head: 1. Moderate to severely motion degraded examination, significantly limiting evaluation for intracranial arterial stenoses. 2. No intracranial large vessel occlusion. 3. Apparent high-grade stenosis within the V4 right  vertebral artery. Electronically Signed   By: Kellie Simmering DO   On: 03/28/2020 11:18   DG CHEST PORT 1 VIEW  Result Date: 03/28/2020 CLINICAL DATA:  Weakness and cough over the last day. EXAM: PORTABLE CHEST 1 VIEW COMPARISON:  01/09/2020 FINDINGS: Previous median sternotomy and valve replacement, probably aortic. Chronic cardiomegaly. Dual lead pacemaker in place. The lungs are clear. The vascularity is normal. No visible effusion. Surgical clips right axilla. IMPRESSION: No active disease. Previous valve replacement. Dual lead pacemaker. Electronically Signed   By: Nelson Chimes M.D.   On: 03/28/2020 14:11   ECHOCARDIOGRAM COMPLETE  Result Date: 03/28/2020    ECHOCARDIOGRAM REPORT   Patient Name:   [name] Date of Exam: 03/28/2020 Medical Rec #:  [id number]     Height:       68.0 in Accession #:    [id number]    Weight:       214.5 lb Date of Birth:  10/19/1950      BSA:          2.105 m Patient Age:    10 years      BP:           100/53 mmHg Patient Gender: F             HR:           67 bpm. Exam Location:  Inpatient Procedure: 2D Echo Indications:    TIA 435.9  History:        Patient has prior history of Echocardiogram examinations, most                 recent 08/23/2019. Pacemaker; Risk Factors:Dyslipidemia,                 Diabetes, Hypertension and Former Smoker. Aortic valve                 replacement (St Jude mechanical), Replacement of ascending                 aortic aneurysm and proximal hemiarch replacement using a 28-mm                 Hemashield tube graft. Aortic graft-to-innominate artery bypass.                 05/22/2008.                 Aortic Valve: 25 mm St. Jude mechanical valved conduit valve is                 present in the aortic position. Procedure Date: 05/22/2008.  Sonographer:    Jannett Celestine RDCS (AE) Referring Phys: [id number] Rosalin Hawking IMPRESSIONS  1. Left ventricular ejection fraction, by estimation, is 50 to 55%. The left ventricle has low normal function. Left  ventricular endocardial border not optimally defined to evaluate regional wall motion. Left ventricular diastolic parameters are indeterminate.  2. Right ventricular systolic function is mildly reduced. The right ventricular size is normal. Tricuspid regurgitation signal is inadequate for assessing PA pressure.  3. The mitral valve is degenerative. No evidence of mitral valve regurgitation. No evidence of mitral stenosis. Moderate mitral annular calcification.  4. The aortic valve has been repaired/replaced. Aortic valve regurgitation is trivial. No aortic stenosis is present. Mechanical valve leaflets not well visualized. There is a 25 mm St. Jude mechanical valved conduit valve present in the aortic position. Procedure Date: 05/22/2008. Echo findings are consistent with normal structure and function of the aortic valve prosthesis. Aortic valve mean gradient measures 7.0 mmHg.  5. Aortic root/ascending aorta has been repaired/replaced. Grossly normal appearance in proximal segment, not well visualized beyond proximal ascending aorta.  6. The inferior vena cava is normal in size with greater than 50% respiratory variability, suggesting right atrial pressure of 3 mmHg. Conclusion(s)/Recommendation(s): No intracardiac source of embolism detected on this transthoracic study. A transesophageal echocardiogram is recommended to exclude cardiac source of embolism if clinically indicated. FINDINGS  Left Ventricle: Left ventricular ejection fraction, by estimation, is 50 to 55%. The left ventricle has low normal function. Left ventricular  endocardial border not optimally defined to evaluate regional wall motion. The left ventricular internal cavity  size was normal in size. There is no left ventricular hypertrophy. Left ventricular diastolic parameters are indeterminate. Right Ventricle: The right ventricular size is normal. No increase in right ventricular wall thickness. Right ventricular systolic function is mildly  reduced. Tricuspid regurgitation signal is inadequate for assessing PA pressure. Left Atrium: Left atrial size was not well visualized. Right Atrium: Right atrial size was not well visualized. Pericardium: There is no evidence of pericardial effusion. Mitral Valve: The mitral valve is degenerative in appearance. Moderate mitral annular calcification. No evidence of mitral valve regurgitation. No evidence of mitral valve stenosis. Tricuspid Valve: The tricuspid valve is not well visualized. Tricuspid valve regurgitation is trivial. No evidence of tricuspid stenosis. Aortic Valve: Normal aortic valve prosthesis. AT 81 msec, DVI 0.47, EOA 2.3 cm2, iEOA 1.1 cm2/m2. The aortic valve has been repaired/replaced. Aortic valve regurgitation is trivial. No aortic stenosis is present. Aortic valve mean gradient measures 7.0 mmHg. Aortic valve peak gradient measures 16.1 mmHg. Aortic valve area, by VTI measures 2.30 cm. There is a 25 mm St. Jude mechanical valved conduit valve present in the aortic position. Procedure Date: 05/22/2008. Echo findings are consistent with normal structure and function of the aortic valve prosthesis. Pulmonic Valve: The pulmonic valve was not well visualized. Pulmonic valve regurgitation is not visualized. No evidence of pulmonic stenosis. Aorta: The aortic root/ascending aorta has been repaired/replaced and the ascending aorta was not well visualized. Venous: The inferior vena cava is normal in size with greater than 50% respiratory variability, suggesting right atrial pressure of 3 mmHg. IAS/Shunts: The interatrial septum was not well visualized. Additional Comments: A pacer wire is visualized in the right atrium and right ventricle.  LEFT VENTRICLE PLAX 2D LVIDd:         5.20 cm LVIDs:         3.80 cm LV PW:         1.00 cm LV IVS:        1.00 cm LVOT diam:     2.50 cm LV SV:         72 LV SV Index:   34 LVOT Area:     4.91 cm  RIGHT VENTRICLE TAPSE (M-mode): 1.8 cm LEFT ATRIUM             Index LA diam:      3.80 cm  1.80 cm/m LA Vol (A2C): 115.0 ml 54.62 ml/m  AORTIC VALVE AV Area (Vmax):    2.05 cm AV Area (Vmean):   1.90 cm AV Area (VTI):     2.30 cm AV Vmax:           200.50 cm/s AV Vmean:          146.000 cm/s AV VTI:            0.312 m AV Peak Grad:      16.1 mmHg AV Mean Grad:      7.0 mmHg LVOT Vmax:         83.60 cm/s LVOT Vmean:        56.500 cm/s LVOT VTI:          0.146 m LVOT/AV VTI ratio: 0.47  AORTA Ao Root diam: 2.90 cm MITRAL VALVE MV Area (PHT): 2.87 cm    SHUNTS MV Decel Time: 264 msec    Systemic VTI:  0.15 m MV E velocity: 99.00 cm/s  Systemic Diam: 2.50 cm Cherlynn Kaiser MD  Electronically signed by Cherlynn Kaiser MD Signature Date/Time: 03/28/2020/9:34:58 PM    Final    DG HIP UNILAT WITH PELVIS 2-3 VIEWS LEFT  Result Date: 03/27/2020 CLINICAL DATA:  Acute left hip pain after multiple falls. EXAM: DG HIP (WITH OR WITHOUT PELVIS) 2-3V LEFT COMPARISON:  None. FINDINGS: There is no evidence of hip fracture or dislocation. There is no evidence of arthropathy or other focal bone abnormality. IMPRESSION: Negative. Electronically Signed   By: Marijo Conception M.D.   On: 03/27/2020 18:28   VAS US CAROTID  Result Date: 03/29/2020 Carotid Arterial Duplex Study Indications:       TIA. Risk Factors:      Hypertension, hyperlipidemia, Diabetes. Comparison Study:  no prior Performing Technologist: Abram Sander RVS  Examination Guidelines: A complete evaluation includes B-mode imaging, spectral Doppler, color Doppler, and power Doppler as needed of all accessible portions of each vessel. Bilateral testing is considered an integral part of a complete examination. Limited examinations for reoccurring indications may be performed as noted.  Right Carotid Findings: +----------+--------+--------+--------+------------------+--------+           PSV cm/sEDV cm/sStenosisPlaque DescriptionComments +----------+--------+--------+--------+------------------+--------+ CCA Prox  69       13              heterogenous               +----------+--------+--------+--------+------------------+--------+ CCA Distal73      17              heterogenous               +----------+--------+--------+--------+------------------+--------+ ICA Prox  84      30      1-39%   heterogenous               +----------+--------+--------+--------+------------------+--------+ ICA Distal127     40                                         +----------+--------+--------+--------+------------------+--------+ ECA       75      6                                          +----------+--------+--------+--------+------------------+--------+ +----------+--------+-------+--------+-------------------+           PSV cm/sEDV cmsDescribeArm Pressure (mmHG) +----------+--------+-------+--------+-------------------+ [id number]                                         +----------+--------+-------+--------+-------------------+ +---------+--------+--+--------+-+---------+ VertebralPSV cm/s40EDV cm/s7Antegrade +---------+--------+--+--------+-+---------+  Left Carotid Findings: +----------+--------+--------+--------+------------------+--------+           PSV cm/sEDV cm/sStenosisPlaque DescriptionComments +----------+--------+--------+--------+------------------+--------+ CCA Prox  99      14              heterogenous               +----------+--------+--------+--------+------------------+--------+ CCA Distal75      17              heterogenous               +----------+--------+--------+--------+------------------+--------+ ICA Prox  146     43      1-39%   heterogenous               +----------+--------+--------+--------+------------------+--------+  ICA Distal93      23                                         +----------+--------+--------+--------+------------------+--------+ ECA       85      9                                           +----------+--------+--------+--------+------------------+--------+ +----------+--------+--------+--------+-------------------+           PSV cm/sEDV cm/sDescribeArm Pressure (mmHG) +----------+--------+--------+--------+-------------------+ [id number]                                         +----------+--------+--------+--------+-------------------+ +---------+--------+--+--------+--+---------+ VertebralPSV cm/s66EDV cm/s21Antegrade +---------+--------+--+--------+--+---------+   Summary: Right Carotid: Velocities in the right ICA are consistent with a 1-39% stenosis. Left Carotid: Velocities in the left ICA are consistent with a 1-39% stenosis. Vertebrals: Bilateral vertebral arteries demonstrate antegrade flow. *See table(s) above for measurements and observations.  Electronically signed by Antony Contras MD on 03/29/2020 at 8:20:21 AM.    Final    US Abdomen Limited RUQ (LIVER/GB)  Result Date: 03/27/2020 CLINICAL DATA:  Elevated liver function tests. Status post cholecystectomy 30 years ago per patient. EXAM: ULTRASOUND ABDOMEN LIMITED RIGHT UPPER QUADRANT COMPARISON:  CT abdomen pelvis 10/16/2015. FINDINGS: Gallbladder: Post cholecystectomy. Common bile duct: Diameter: 8 mm Liver: No focal lesion identified. Within normal limits in parenchymal echogenicity. Portal vein is patent on color Doppler imaging with normal direction of blood flow towards the liver. Other: Visualized right kidney demonstrates no hydronephrosis. IMPRESSION: Unremarkable right upper quadrant ultrasound in a patient that is status post cholecystectomy. Electronically Signed   By: Iven Finn M.D.   On: 03/27/2020 23:53    Medications:   . amiodarone  200 mg Oral BID  . aspirin EC  81 mg Oral Daily  . fenofibrate  160 mg Oral Daily  . influenza vaccine adjuvanted  0.5 mL Intramuscular Tomorrow-1000  . insulin aspart  0-5 Units Subcutaneous QHS  . insulin aspart  0-9 Units Subcutaneous TID WC  .  lamoTRIgine  100 mg Oral Daily  . lamoTRIgine  200 mg Oral QHS  . OXcarbazepine  150 mg Oral BID  . pantoprazole  20 mg Oral BH-qamhs  . QUEtiapine  600 mg Oral QPM  . rOPINIRole  0.5 mg Oral QHS  . rosuvastatin  40 mg Oral Daily  . venlafaxine XR  75 mg Oral Q breakfast  . vitamin B-12  1,000 mcg Oral Daily  . warfarin  5 mg Oral ONCE-1600  . Warfarin - Pharmacist Dosing Inpatient   Does not apply q1600   Continuous Infusions: . sodium chloride 50 mL/hr at 03/29/20 1444  . cefTRIAXone (ROCEPHIN)  IV 200 mL/hr at 03/29/20 0700     LOS: 1 day   Geradine Girt  [facility]   How to contact the [facility] Attending or Consulting provider [place] or covering provider during after hours [place], for this patient?  1. Check the care team in [facility] and look for a) attending/consulting [facility] provider listed and b) the [facility] team listed 2. Log into [email or web address] and use [place]'s universal password to access. If you do not have  the password, please contact the hospital operator. 3. Locate the [facility] provider you are looking for under [facility] and page to a number that you can be directly reached. 4. If you still have difficulty reaching the provider, please page the [facility] (Director on Call) for the Hospitalists listed on amion for assistance.  03/29/2020, 3:05 PM

## 2020-03-29 NOTE — Progress Notes (Signed)
Physical Therapy Treatment Patient Details Name: GLYNN FREAS MRN: [id number] DOB: 11/30/1950 Today's Date: 03/29/2020    History of Present Illness 69 y.o. female with a history of DM, migraines, recurrent UTIs, anemia, HTN, atrial fibrillation and mechanical aortic valve replacement on Coumadin who presented to ED on 03/27/20 with recurrent falls over the last 24 hours, in addition to left-sided weakness and intermittent word finding difficulty.  Found to have Large hematoma with tenderness over the left hip and proximal femur. CT of head was negative for acute intracranial abnormality. MRI negative for acute abnormality, + for chronic hemorrhagic lacunar infarct  R basal ganglia and lacunar infarct L parietal white matter as well as small vessel disease.      PT Comments    Pt sitting in bed having just finished lunch, unaware of how much food she has spilled on herself. Pt has increased strength today however continues to have L sided weakness which effects her ability to pick up and swing through her L LE. Pt is supervision for bed mobility and min guard for transfers however requires minA as she ambulates to insure she clears her L leg. Pt unaware of her L sided deficit with gait so she will need supervision with all mobility. PT will continue to follow acutely.    Follow Up Recommendations  Home health PT;Supervision/Assistance - 24 hour     Equipment Recommendations  None recommended by PT       Precautions / Restrictions Precautions Precautions: Fall Precaution Comments: hx of multiple recent falls Restrictions Weight Bearing Restrictions: No    Mobility  Bed Mobility Overal bed mobility: Needs Assistance Bed Mobility: Supine to Sit     Supine to sit: Supervision        Transfers Overall transfer level: Needs assistance Equipment used: Rolling walker (2 wheeled) Transfers: Sit to/from Stand Sit to Stand: Min guard;From elevated surface         General transfer  comment: slow to rise, good technique with sit to stand, cues for safety when approaching sitting surface with RW, improved control of descent this date  Ambulation/Gait Ambulation/Gait assistance: Min assist;Min guard Gait Distance (Feet): 80 Feet Assistive device: Rolling walker (2 wheeled) Gait Pattern/deviations: Step-through pattern;Decreased stance time - left;Decreased step length - right Gait velocity: slowed Gait velocity interpretation: <1.8 ft/sec, indicate of risk for recurrent falls General Gait Details: initiated gait with min guard, again has increased knee buckling and catching L foot on floor, with maximal vc and min A pt cued for increased dorsiflexion and kneed flexion for swing through phase of gait        Balance Overall balance assessment: Needs assistance Sitting-balance support: Feet supported;No upper extremity supported Sitting balance-Leahy Scale: Good       Standing balance-Leahy Scale: Poor Standing balance comment: can stand statically with min guard assist to don mask, reliant on RW for ambulation                            Cognition Arousal/Alertness: Awake/alert Behavior During Therapy: WFL for tasks assessed/performed Overall Cognitive Status: Impaired/Different from baseline Area of Impairment: Memory;Following commands;Safety/judgement;Awareness;Problem solving                     Memory: Decreased short-term memory Following Commands: Follows one step commands consistently;Follows multi-step commands inconsistently Safety/Judgement: Decreased awareness of safety;Decreased awareness of deficits Awareness: Emergent Problem Solving: Slow processing;Difficulty sequencing;Requires verbal cues;Requires tactile cues General Comments: pt  with decreased awareness of her deficits and safety, requires repeated cuing for proximity to [facility]       Exercises General Exercises - Lower Extremity Ankle Circles/Pumps: 10  reps;Both;AROM;Seated Long Arc Quad: AROM;Both;10 reps;Seated    General Comments General comments (skin integrity, edema, etc.): VSS on RA      Pertinent Vitals/Pain Pain Assessment: Faces Faces Pain Scale: Hurts a little bit Pain Location: L hip Pain Descriptors / Indicators: Sore Pain Intervention(s): Monitored during session;Repositioned;Limited activity within patient's tolerance           PT Goals (current goals can now be found in the care plan section) Acute Rehab PT Goals Patient Stated Goal: stop falling PT Goal Formulation: With patient Time For Goal Achievement: 04/11/20 Potential to Achieve Goals: Fair Progress towards PT goals: Progressing toward goals    Frequency    Min 3X/week      PT Plan Current plan remains appropriate    Co-evaluation PT/OT/SLP Co-Evaluation/Treatment: Yes Reason for Co-Treatment: Necessary to address cognition/behavior during functional activity;For patient/therapist safety PT goals addressed during session: Mobility/safety with mobility OT goals addressed during session: ADL's and self-care;Proper use of Adaptive equipment and DME      AM-PAC PT "6 Clicks" Mobility   Outcome Measure  Help needed turning from your back to your side while in a flat bed without using bedrails?: None Help needed moving from lying on your back to sitting on the side of a flat bed without using bedrails?: None Help needed moving to and from a bed to a chair (including a wheelchair)?: A Little Help needed standing up from a chair using your arms (e.g., wheelchair or bedside chair)?: A Little Help needed to walk in hospital room?: A Little Help needed climbing 3-5 steps with a railing? : A Lot 6 Click Score: 19    End of Session Equipment Utilized During Treatment: Gait belt Activity Tolerance: Patient tolerated treatment well Patient left: in chair;with call bell/phone within reach;with chair alarm set Nurse Communication: Mobility status PT  Visit Diagnosis: Unsteadiness on feet (R26.81);Repeated falls (R29.6);Other abnormalities of gait and mobility (R26.89);Muscle weakness (generalized) (M62.81);Hemiplegia and hemiparesis Hemiplegia - Right/Left: Left Hemiplegia - dominant/non-dominant: Non-dominant Hemiplegia - caused by: Cerebral infarction     Time: 3794-3276 PT Time Calculation (min) (ACUTE ONLY): 23 min  Charges:  $Gait Training: 8-22 mins                     Clariza Sickman B. Migdalia Dk PT, DPT Acute Rehabilitation Services Pager [phone] Office [phone]    [place] 03/29/2020, 3:46 PM

## 2020-03-29 NOTE — Progress Notes (Addendum)
Occupational Therapy Treatment Patient Details Name: [name] MRN: [id number] DOB: [birth year] Today's Date: 03/29/2020    History of present illness 69 y.o. female with a history of DM, migraines, recurrent UTIs, anemia, HTN, atrial fibrillation and mechanical aortic valve replacement on Coumadin who presented to ED on 03/27/20 with recurrent falls over the last 24 hours, in addition to left-sided weakness and intermittent word finding difficulty.  Found to have Large hematoma with tenderness over the left hip and proximal femur. CT of head was negative for acute intracranial abnormality. MRI negative for acute abnormality, + for chronic hemorrhagic lacunar infarct  R basal ganglia and lacunar infarct L parietal white matter as well as small vessel disease.     OT comments  Pt readily willing to work with therapies. Min assist needed for UB dressing and min guard for one grooming activity in standing. Pt continues to be a significant fall risk with decreased awareness of deficits and ability to generalized instruction. She endorses having decreased sensation in L LE.   Follow Up Recommendations  Home health OT;Supervision/Assistance - 24 hour    Equipment Recommendations  Tub/shower bench    Recommendations for Other Services      Precautions / Restrictions Precautions Precautions: Fall Precaution Comments: hx of multiple recent falls       Mobility Bed Mobility Overal bed mobility: Needs Assistance Bed Mobility: Supine to Sit     Supine to sit: Supervision        Transfers Overall transfer level: Needs assistance Equipment used: Rolling walker (2 wheeled) Transfers: Sit to/from Stand Sit to Stand: Min guard;From elevated surface         General transfer comment: slow to rise, good technique with sit to stand, cues for safety when approaching sitting surface with RW, improved control of descent this date    Balance Overall balance assessment: Needs  assistance Sitting-balance support: Feet supported;No upper extremity supported Sitting balance-Leahy Scale: Good       Standing balance-Leahy Scale: Poor Standing balance comment: can stand statically with min guard assist to don mask, reliant on RW for ambulation                           ADL either performed or assessed with clinical judgement   ADL Overall ADL's : Needs assistance/impaired     Grooming: Brushing hair;Standing;Min guard           Upper Body Dressing : Minimal assistance;Sitting Upper Body Dressing Details (indicate cue type and reason): changed soiled gown, donned front opening gown                 Functional mobility during ADLs: Minimal assistance;+2 for safety/equipment;Rolling walker;Cueing for sequencing;Cueing for safety       Vision       Perception     Praxis      Cognition Arousal/Alertness: Awake/alert Behavior During Therapy: WFL for tasks assessed/performed Overall Cognitive Status: Impaired/Different from baseline Area of Impairment: Memory;Following commands;Safety/judgement;Awareness;Problem solving                     Memory: Decreased short-term memory Following Commands: Follows one step commands consistently;Follows multi-step commands inconsistently Safety/Judgement: Decreased awareness of safety;Decreased awareness of deficits Awareness: Emergent Problem Solving: Slow processing;Difficulty sequencing;Requires verbal cues;Requires tactile cues General Comments: pt with decreased awareness of her deficits and safety, requires repeated cuing for proximity to [facility]         Exercises  Shoulder Instructions       General Comments      Pertinent Vitals/ Pain       Pain Assessment: Faces Faces Pain Scale: Hurts a little bit Pain Location: L hip Pain Descriptors / Indicators: Sore Pain Intervention(s): Monitored during session;Repositioned  Home Living                                           Prior Functioning/Environment              Frequency  Min 2X/week        Progress Toward Goals  OT Goals(current goals can now be found in the care plan section)  Progress towards OT goals: Progressing toward goals  Acute Rehab OT Goals Patient Stated Goal: stop falling OT Goal Formulation: With patient Time For Goal Achievement: 04/11/20 Potential to Achieve Goals: Good  Plan Discharge plan remains appropriate    Co-evaluation    PT/OT/SLP Co-Evaluation/Treatment: Yes Reason for Co-Treatment: For patient/therapist safety   OT goals addressed during session: ADL's and self-care;Proper use of Adaptive equipment and DME      AM-PAC OT "6 Clicks" Daily Activity     Outcome Measure   Help from another person eating meals?: None Help from another person taking care of personal grooming?: A Little Help from another person toileting, which includes using toliet, bedpan, or urinal?: A Little Help from another person bathing (including washing, rinsing, drying)?: A Little Help from another person to put on and taking off regular upper body clothing?: A Little Help from another person to put on and taking off regular lower body clothing?: A Little 6 Click Score: 19    End of Session Equipment Utilized During Treatment: Rolling walker;Gait belt  OT Visit Diagnosis: Unsteadiness on feet (R26.81);Other abnormalities of gait and mobility (R26.89);Hemiplegia and hemiparesis;Muscle weakness (generalized) (M62.81);Other symptoms and signs involving cognitive function   Activity Tolerance Patient tolerated treatment well   Patient Left in chair;with call bell/phone within reach;with chair alarm set   Nurse Communication          Time: [phone] OT Time Calculation (min): 23 min  Charges: OT General Charges $OT Visit: 1 Visit OT Treatments $Self Care/Home Management : 8-22 mins  [name], OTR/L Acute Rehabilitation Services Pager:  [phone] Office: [phone]   [name] 03/29/2020, 1:37 PM

## 2020-03-29 NOTE — Progress Notes (Signed)
STROKE TEAM PROGRESS NOTE   INTERVAL HISTORY No family at bedside.  However, daughter on the phone.  Patient still complains mild left hand weakness, mild dragging on the left while walking.  PT/OT recommend home PT OT.  Patient creatinine trending up from 1.3-1.74.  Still have mild decrease of hemoglobin.   Vitals:   03/29/20 0342 03/29/20 0800 03/29/20 1300 03/29/20 1600  BP: (!) 93/50 (!) 95/47 128/62 122/62  Pulse: 66 66 65 70  Resp: 15 16 14 15   Temp: 97.9 F (36.6 C) (!) 97.5 F (36.4 C) 98 F (36.7 C) 97.9 F (36.6 C)  TempSrc: Oral Oral Oral Oral  SpO2: 95% 94% 98% 95%  Weight:      Height:       CBC:  Recent Labs  Lab 03/28/20 0422 03/29/20 0019  WBC 6.2 5.6  HGB 9.5* 9.0*  HCT 30.3* 28.8*  MCV 97.1 97.0  PLT 219 093   Basic Metabolic Panel:  Recent Labs  Lab 03/27/20 1757 03/29/20 0019  NA 139 138  K 4.0 3.8  CL 103 104  CO2 24 24  GLUCOSE 92 139*  BUN 15 24*  CREATININE 1.30* 1.74*  CALCIUM 8.7* 8.6*   Lipid Panel:  Recent Labs  Lab 03/29/20 0019  CHOL 126  TRIG 148  HDL 51  CHOLHDL 2.5  VLDL 30  LDLCALC 45   HgbA1c:  Recent Labs  Lab 03/28/20 0422  HGBA1C 5.3   Urine Drug Screen: No results for input(s): LABOPIA, COCAINSCRNUR, LABBENZ, AMPHETMU, THCU, LABBARB in the last 168 hours.  Alcohol Level No results for input(s): ETH in the last 168 hours.  IMAGING past 24 hours No results found.   Transthoracic Echocardiogram  IMPRESSIONS  1. Left ventricular ejection fraction, by estimation, is 50 to 55%. The  left ventricle has low normal function. Left ventricular endocardial  border not optimally defined to evaluate regional wall motion. Left  ventricular diastolic parameters are  indeterminate.  2. Right ventricular systolic function is mildly reduced. The right  ventricular size is normal. Tricuspid regurgitation signal is inadequate  for assessing PA pressure.  3. The mitral valve is degenerative. No evidence of mitral valve   regurgitation. No evidence of mitral stenosis. Moderate mitral annular  calcification.  4. The aortic valve has been repaired/replaced. Aortic valve  regurgitation is trivial. No aortic stenosis is present. Mechanical valve  leaflets not well visualized. There is a 25 mm [employer] mechanical valved  conduit valve present in the aortic  position. Procedure Date: 05/22/2008. Echo findings are consistent with  normal structure and function of the aortic valve prosthesis. Aortic valve  mean gradient measures 7.0 mmHg.  5. Aortic root/ascending aorta has been repaired/replaced. Grossly normal  appearance in proximal segment, not well visualized beyond proximal  ascending aorta.  6. The inferior vena cava is normal in size with greater than 50%  respiratory variability, suggesting right atrial pressure of 3 mmHg.   Conclusion(s)/Recommendation(s): No intracardiac source of embolism  detected on this transthoracic study. A transesophageal echocardiogram is  recommended to exclude cardiac source of embolism if clinically indicated.   PHYSICAL EXAM   Temp:  [97.5 F (36.4 C)-98.2 F (36.8 C)] 97.9 F (36.6 C) (11/04 1600) Pulse Rate:  [62-70] 70 (11/04 1600) Resp:  [13-18] 15 (11/04 1600) BP: (93-128)/(47-65) 122/62 (11/04 1600) SpO2:  [94 %-98 %] 95 % (11/04 1600)  General - Well nourished, well developed, in no apparent distress.  Ophthalmologic - fundi not visualized due  to noncooperation.  Cardiovascular - irregularly irregular heart rate and rhythm.  Mental Status -  Level of arousal and orientation to time, place, and person were intact. Language including expression, naming, repetition, comprehension was assessed and found intact. Fund of Knowledge was assessed and was intact.  Cranial Nerves II - XII - II - Visual field intact OU. III, IV, VI - Extraocular movements intact. V - Facial sensation intact bilaterally. VII - Facial movement intact bilaterally. VIII -  Hearing & vestibular intact bilaterally. X - Palate elevates symmetrically. XI - Chin turning & shoulder shrug intact bilaterally. XII - Tongue protrusion intact.  Motor Strength - The patient's strength was normal in all extremities and pronator drift was absent except left lower extremity 4+/5 largely due to left hip pain and left thigh bruises/hematoma due to fall.  Bulk was normal and fasciculations were absent.   Motor Tone - Muscle tone was assessed at the neck and appendages and was normal.  Reflexes - The patient's reflexes were symmetrical in all extremities and she had no pathological reflexes.  Sensory - Light touch, temperature/pinprick were assessed and were symmetrical.  Proprioception intact on the right foot, decreased on the left foot.  Coordination - The patient had normal movements in the hands with no ataxia or dysmetria.  Tremor was absent.  Gait and Station - deferred.   ASSESSMENT/PLAN Ms. FALLAN MCCAREY is a 69 y.o. female with history of DM, migraines, recurrent UTIs, anemia, HTN, atrial fibrillation and mechanical aortic valve replacement on Coumadin presenting following recurrent falls w/ L sided weakness and intermittent word finding difficulties w/ migraine HA on Monday.    Encephalopathy/recrudescence of old stroke in the setting of medical conditions including anemia, AKI, UTI and B12 deficiency  Resultant recurrent falls w/ L sided weakness and word finding difficulties   CT head No acute abnormality. Small vessel disease. Atrophy.   MRI  No acute infarct. Chronic R basal ganglia lacune. Chronic L parietal white matter lacune. Small vessel disease. Atrophy.   MRA  Motion. No LVO. R V4 high-grade stenosis   Carotid Doppler unremarkable  CT head and neck 07/2019 unremarkable  2D Echo - EF 50 - 55%. No cardiac source of emboli identified.   LDL - 45  HgbA1c 5.3  VTE prophylaxis - warfarin  aspirin 81 mg daily and warfarin daily prior to admission,  now on aspirin 81 mg daily and warfarin daily.   Therapy recommendations:  HH PT OT  Disposition:  pending  (d/c from rehab 4-6 wks ago, living w/ dtr)  Atrial Fibrillation/Flutter Mechanical aortic valve  Home anticoagulation:  warfarin daily   Last INR 2.5->2.6 ->2.6  PPM  Continue Coumadin with INR goal 2 -3   B12 deficiency  Part of the reason of falling  Decreased proprioception on the left foot  Stat post B12 injection x 1  Continue B12 p.o. daily  UTI  Frequent UTI in the past  UA WBCs 21-50  Urine culture insignificant growth  On Rocephin 11/3>>11/4  AKI, worsening  Baseline creatinine 1.1  This admission creatinine 1.47->1.30->1.74  Encourage p.o. intake  Avoid dehydration  Hypertension  Stable . BP goal normotensive  Anemia, chronic disease  Hemoglobin baseline 12-13  This admission hemoglobin 10.4->9.5->9.0  Iron 48, ferritin 123  Hyperlipidemia  Home meds:  crestor 40, resumed in hospital  LDL 45, goal < 70  Continue statin at discharge  Diabetes, type II  HgbA1c 5.3, goal < 7.0  CBG monitoring  SSI  Close PCP follow-up  Other Stroke Risk Factors  Advanced Age >/= 46   Former Cigarette smoker, quit 29 yrs ago  ETOH use, advised to drink no more than 1 drink(s) a day  Obesity, Body mass index is 32.62 kg/m., BMI >/= 30 associated with increased stroke risk, recommend weight loss, diet and exercise as appropriate   Migraines  Chronic combined systolic and diastolic CHF  Other Active Problems  Mildly elevated transaminases  GERD  Depression, bipolar d/o, on Lamictal, Seroquel and Effexor  Hospital day # 1  Neurology will sign off. Please call with questions. No neuro follow up needed at this time. Follow up with PCP recommended. Thanks for the consult.  Rosalin Hawking, MD PhD Stroke Neurology 03/29/2020 7:08 PM       To contact Stroke Continuity provider, please refer to [email or web address] After hours,  contact General Neurology

## 2020-03-29 NOTE — Progress Notes (Signed)
[place] for warfarin Indication: AVR/Afib  Allergies  Allergen Reactions  . Mucinex [Guaifenesin Er] Other (See Comments)    Severe headaches   . Keflex [Cephalexin] Other (See Comments)    Headaches and dizziness  . Sulfa Antibiotics Other (See Comments)    headaches  . Sulfonamide Derivatives Other (See Comments)    Headaches     Patient Measurements: Height: 5\' 8"  (172.7 cm) Weight: 97.3 kg (214 lb 8.1 oz) IBW/kg (Calculated) : 63.9  Vital Signs: Temp: 97.9 F (36.6 C) (11/04 0342) Temp Source: Oral (11/04 0342) BP: 93/50 (11/04 0342) Pulse Rate: 66 (11/04 0342)  Labs: Recent Labs    03/27/20 1155 03/27/20 1155 03/27/20 1434 03/27/20 1757 03/28/20 0422 03/29/20 0019  HGB 10.4*   < >  --   --  9.5* 9.0*  HCT 32.5*  --   --   --  30.3* 28.8*  PLT 264  --   --   --  219 214  LABPROT  --   --   --   --  27.3* 26.8*  INR  --   --  2.5  --  2.6* 2.6*  CREATININE 1.47*  --   --  1.30*  --  1.74*   < > = values in this interval not displayed.    Estimated Creatinine Clearance: 37.2 mL/min (A) (by C-G formula based on SCr of 1.74 mg/dL (H)).   Medical History: Past Medical History:  Diagnosis Date  . ANEMIA 05/28/2010  . AORTIC VALVE REPLACEMENT, HX OF 03/11/2010   Mechanical prosthesis  . BIPOLAR AFFECTIVE DISORDER 03/11/2010  . Depression   . Diverticulitis 12/18/2010  . FIBROIDS, UTERUS 03/11/2010  . Gout 09/04/2016  . Grave's disease 8-12  . Hyperlipidemia associated with type 2 diabetes mellitus ([place]) 06/05/2016  . Hypertension   . Low back pain 11/02/2017  . Migraine 06/05/2016  . Mixed hyperlipidemia 03/11/2010  . Obesity   . Skin cancer   . Syncope 08/22/2019  . UTI (lower urinary tract infection) 08/14/2011    Assessment: 2 yoF on warfarin for hx mAVR + AFib admitted with fall and found to have L thigh hematoma. Warfarin initially held on admit, now restarted 11/3, INR remains therapeutic at 2.6.  *Home  warfarin dose = 2.5mg  Mon/Fri, 5mg  AODs  Goal of Therapy:  INR 2.5-3.5   Plan:  Warfarin 5mg  PO x1 tonight Daily protime Watch hematoma   Arrie Senate, PharmD, BCPS Clinical Pharmacist [phone] Please check AMION for all [place] numbers 03/29/2020

## 2020-03-30 DIAGNOSIS — T148XXA Other injury of unspecified body region, initial encounter: Secondary | ICD-10-CM

## 2020-03-30 DIAGNOSIS — R7989 Other specified abnormal findings of blood chemistry: Secondary | ICD-10-CM

## 2020-03-30 LAB — CBC
HCT: 28 % — ABNORMAL LOW (ref 36.0–46.0)
Hemoglobin: 8.7 g/dL — ABNORMAL LOW (ref 12.0–15.0)
MCH: 30.1 pg (ref 26.0–34.0)
MCHC: 31.1 g/dL (ref 30.0–36.0)
MCV: 96.9 fL (ref 80.0–100.0)
Platelets: 199 10*3/uL (ref 150–400)
RBC: 2.89 MIL/uL — ABNORMAL LOW (ref 3.87–5.11)
RDW: 14.9 % (ref 11.5–15.5)
WBC: 5.6 10*3/uL (ref 4.0–10.5)
nRBC: 0 % (ref 0.0–0.2)

## 2020-03-30 LAB — BASIC METABOLIC PANEL
Anion gap: 9 (ref 5–15)
BUN: 20 mg/dL (ref 8–23)
CO2: 24 mmol/L (ref 22–32)
Calcium: 8.7 mg/dL — ABNORMAL LOW (ref 8.9–10.3)
Chloride: 106 mmol/L (ref 98–111)
Creatinine, Ser: 1.29 mg/dL — ABNORMAL HIGH (ref 0.44–1.00)
GFR, Estimated: 45 mL/min — ABNORMAL LOW (ref >60)
Glucose, Bld: 143 mg/dL — ABNORMAL HIGH (ref 70–99)
Potassium: 3.8 mmol/L (ref 3.5–5.1)
Sodium: 139 mmol/L (ref 135–145)

## 2020-03-30 LAB — GLUCOSE, CAPILLARY
Glucose-Capillary: 86 mg/dL (ref 70–99)
Glucose-Capillary: 106 mg/dL — ABNORMAL HIGH (ref 70–99)
Glucose-Capillary: 96 mg/dL (ref 70–99)
Glucose-Capillary: 103 mg/dL — ABNORMAL HIGH (ref 70–99)

## 2020-03-30 LAB — PROTIME-INR
INR: 3.1 — ABNORMAL HIGH (ref 0.8–1.2)
Prothrombin Time: 31.1 seconds — ABNORMAL HIGH (ref 11.4–15.2)

## 2020-03-30 MED ORDER — WARFARIN SODIUM 2.5 MG PO TABS
2.5000 mg | ORAL_TABLET | Freq: Once | ORAL | Status: AC
  Administered 2020-03-30: 2.5 mg via ORAL
  Filled 2020-03-30: qty 1

## 2020-03-30 MED ORDER — LIDOCAINE 5 % EX PTCH
1.0000 | MEDICATED_PATCH | CUTANEOUS | Status: DC
  Administered 2020-03-30: 1 via TRANSDERMAL
  Filled 2020-03-30 (×2): qty 1

## 2020-03-30 NOTE — Progress Notes (Signed)
Physical Therapy Treatment Patient Details Name: [name] MRN: [id number] DOB: 1950-10-23 Today's Date: 03/30/2020    History of Present Illness 69 y.o. female with a history of DM, migraines, recurrent UTIs, anemia, HTN, atrial fibrillation and mechanical aortic valve replacement on Coumadin who presented to ED on 03/27/20 with recurrent falls over the last 24 hours, in addition to left-sided weakness and intermittent word finding difficulty.  Found to have Large hematoma with tenderness over the left hip and proximal femur. CT of head was negative for acute intracranial abnormality. MRI negative for acute abnormality, + for chronic hemorrhagic lacunar infarct  R basal ganglia and lacunar infarct L parietal white matter as well as small vessel disease.      PT Comments    Pt supine in bed on arrival.  Focused on gt training and position and use of LLE to maintain safety.  Pt performed LE strengthening requiring active assisted ROM for L side. Educated on use of gt belt to self assist when performing exercises between PT session.  Plan for stair training next session.  HHPT remains appropriate.     Follow Up Recommendations  Home health PT;Supervision/Assistance - 24 hour     Equipment Recommendations  None recommended by PT    Recommendations for Other Services       Precautions / Restrictions Precautions Precautions: Fall Precaution Comments: hx of multiple recent falls Restrictions Weight Bearing Restrictions: No    Mobility  Bed Mobility Overal bed mobility: Modified Independent Bed Mobility: Supine to Sit           General bed mobility comments: supervision for safety, use of bedrail to pull to EoB, once in supine use of head board to pull to [facility].  Transfers Overall transfer level: Needs assistance Equipment used: Rolling walker (2 wheeled) Transfers: Sit to/from Stand Sit to Stand: Min guard;From elevated surface         General transfer comment: cues for  safety as she approached chair with RW to back entirely to seated surface.  Ambulation/Gait Ambulation/Gait assistance: Min assist Gait Distance (Feet): 120 Feet Assistive device: Rolling walker (2 wheeled) Gait Pattern/deviations: Step-through pattern;Decreased stance time - left;Decreased step length - right;Decreased dorsiflexion - left Gait velocity: slowed   General Gait Details: Cues for L swingphase and heel strike.  Pt impulsive and pushing walker quicker than her L foot can keep up.  Pt required cues for safety.  Assistance to maintain balance.   Stairs             Wheelchair Mobility    Modified Rankin (Stroke Patients Only)       Balance Overall balance assessment: Needs assistance Sitting-balance support: Feet supported;No upper extremity supported Sitting balance-Leahy Scale: Good       Standing balance-Leahy Scale: Poor Standing balance comment: can stand statically with min guard assist to don mask, reliant on RW for ambulation                            Cognition Arousal/Alertness: Awake/alert Behavior During Therapy: WFL for tasks assessed/performed Overall Cognitive Status: Impaired/Different from baseline Area of Impairment: Memory;Safety/judgement                     Memory: Decreased short-term memory   Safety/Judgement: Decreased awareness of safety;Decreased awareness of deficits            Exercises General Exercises - Lower Extremity Ankle Circles/Pumps: AROM;Both;10 reps;Supine Quad Sets:  AROM;Both;10 reps;Supine Long Arc Quad: AROM;Both;10 reps;Seated Heel Slides: AROM;Both;10 reps;Supine Straight Leg Raises: AROM;AAROM;Both;10 reps;Seated Hip Flexion/Marching: AROM;Both;10 reps;Seated;AAROM    General Comments        Pertinent Vitals/Pain Pain Assessment: Faces Faces Pain Scale: Hurts even more Pain Location: L hip Pain Descriptors / Indicators: Sore Pain Intervention(s): Monitored during  session;Repositioned    Home Living                      Prior Function            PT Goals (current goals can now be found in the care plan section) Acute Rehab PT Goals Patient Stated Goal: stop falling Potential to Achieve Goals: Fair Progress towards PT goals: Progressing toward goals    Frequency    Min 3X/week      PT Plan Current plan remains appropriate    Co-evaluation              AM-PAC PT "6 Clicks" Mobility   Outcome Measure  Help needed turning from your back to your side while in a flat bed without using bedrails?: None Help needed moving from lying on your back to sitting on the side of a flat bed without using bedrails?: None Help needed moving to and from a bed to a chair (including a wheelchair)?: A Little Help needed standing up from a chair using your arms (e.g., wheelchair or bedside chair)?: A Little Help needed to walk in hospital room?: A Little Help needed climbing 3-5 steps with a railing? : A Lot 6 Click Score: 19    End of Session Equipment Utilized During Treatment: Gait belt Activity Tolerance: Patient tolerated treatment well Patient left: with call bell/phone within reach;in bed;with bed alarm set Nurse Communication: Mobility status PT Visit Diagnosis: Unsteadiness on feet (R26.81);Repeated falls (R29.6);Other abnormalities of gait and mobility (R26.89);Muscle weakness (generalized) (M62.81);Hemiplegia and hemiparesis Hemiplegia - Right/Left: Left Hemiplegia - dominant/non-dominant: Non-dominant Hemiplegia - caused by: Cerebral infarction     Time: 2706-2376 PT Time Calculation (min) (ACUTE ONLY): 20 min  Charges:  $Gait Training: 8-22 mins                     Erasmo Leventhal , PTA Acute Rehabilitation Services Pager [phone] Office [phone]     Sherita Decoste Eli Hose 03/30/2020, 5:20 PM

## 2020-03-30 NOTE — Plan of Care (Signed)

## 2020-03-30 NOTE — Progress Notes (Signed)
[place] for warfarin Indication: AVR/Afib  Allergies  Allergen Reactions  . Mucinex [Guaifenesin Er] Other (See Comments)    Severe headaches   . Keflex [Cephalexin] Other (See Comments)    Headaches and dizziness  . Sulfa Antibiotics Other (See Comments)    headaches  . Sulfonamide Derivatives Other (See Comments)    Headaches     Patient Measurements: Height: 5\' 8"  (172.7 cm) Weight: 97.3 kg (214 lb 8.1 oz) IBW/kg (Calculated) : 63.9  Vital Signs: Temp: 98 F (36.7 C) (11/05 0304) Temp Source: Oral (11/05 0304) BP: 138/64 (11/05 0304) Pulse Rate: 74 (11/05 0304)  Labs: Recent Labs    03/27/20 1155 03/27/20 1757 03/28/20 0422 03/28/20 0422 03/29/20 0019 03/30/20 0024  HGB   < >  --  9.5*   < > 9.0* 8.7*  HCT   < >  --  30.3*  --  28.8* 28.0*  PLT   < >  --  219  --  214 199  LABPROT  --   --  27.3*  --  26.8* 31.1*  INR  --   --  2.6*  --  2.6* 3.1*  CREATININE  --  1.30*  --   --  1.74* 1.29*   < > = values in this interval not displayed.    Estimated Creatinine Clearance: 50.2 mL/min (A) (by C-G formula based on SCr of 1.29 mg/dL (H)).   Medical History: Past Medical History:  Diagnosis Date  . ANEMIA 05/28/2010  . AORTIC VALVE REPLACEMENT, HX OF 03/11/2010   Mechanical prosthesis  . BIPOLAR AFFECTIVE DISORDER 03/11/2010  . Depression   . Diverticulitis 12/18/2010  . FIBROIDS, UTERUS 03/11/2010  . Gout 09/04/2016  . Grave's disease 8-12  . Hyperlipidemia associated with type 2 diabetes mellitus ([place]) 06/05/2016  . Hypertension   . Low back pain 11/02/2017  . Migraine 06/05/2016  . Mixed hyperlipidemia 03/11/2010  . Obesity   . Skin cancer   . Syncope 08/22/2019  . UTI (lower urinary tract infection) 08/14/2011    Assessment: 42 yoF on warfarin for hx mAVR + AFib admitted with fall and found to have L thigh hematoma. Warfarin initially held on admit, now restarted 11/3, INR remains therapeutic at  3.1.  *Home warfarin dose = 2.5mg  Mon/Fri, 5mg  AODs  Goal of Therapy:  INR 2.5-3.5   Plan:  Warfarin 2.5mg  PO x1 tonight Daily protime Watch hematoma   Arrie Senate, PharmD, BCPS Clinical Pharmacist [phone] Please check AMION for all [place] numbers 03/30/2020

## 2020-03-30 NOTE — TOC Transition Note (Addendum)
Transition of Care [facility]) - CM/SW Discharge Note   Patient Details  Name: [name] MRN: [id number] Date of Birth: February 20, 1951  [facility] CM/SW Contact:  Zenon Mayo, RN Phone Number: 03/30/2020, 8:40 AM   Clinical Narrative:    NCM spoke with patient, offered choice for [facility], she states she has [place] with an agency but could not remember.  She contacted her daughter, Amy, she states she is active with [facility] for HHPT, [place].  NCM notified Erin with [facility] , she confirmed patient is active with them.  Will need resumption orders.  Patient lives with daughter and uses a cane.  NCM notified MD to put resumption orders in for [place], [place].   Final next level of care: [place] Barriers to Discharge: Continued Medical Work up   Patient Goals and CMS Choice Patient states their goals for this hospitalization and ongoing recovery are:: get better CMS Medicare.gov Compare Post Acute Care list provided to:: Patient Represenative (must comment) Choice offered to / list presented to : Adult Children  Discharge Placement                       Discharge Plan and Services                  DME Agency: NA       HH Arranged: PT, OT HH Agency: [place] ([facility]) Date [place]: 03/30/20 Time [place]: 3 Representative spoke with at [place]: [place] ([place]) Interventions     Readmission Risk Interventions No flowsheet data found.

## 2020-03-30 NOTE — Progress Notes (Signed)
Occupational Therapy Treatment Patient Details Name: [name] MRN: [id number] DOB: June 27, 1950 Today's Date: 03/30/2020    History of present illness 69 y.o. female with a history of DM, migraines, recurrent UTIs, anemia, HTN, atrial fibrillation and mechanical aortic valve replacement on Coumadin who presented to ED on 03/27/20 with recurrent falls over the last 24 hours, in addition to left-sided weakness and intermittent word finding difficulty.  Found to have Large hematoma with tenderness over the left hip and proximal femur. CT of head was negative for acute intracranial abnormality. MRI negative for acute abnormality, + for chronic hemorrhagic lacunar infarct  R basal ganglia and lacunar infarct L parietal white matter as well as small vessel disease.     OT comments  Pt with increased c/o of soreness in L hip. Only able to tolerate one grooming activity in standing due to pain, leaning elbows on sink for support. Completed UB dressing with set up and toileting with min guard assist. Pt with improved cognition compared to previous sessions, recalling information given by MDs.   Follow Up Recommendations  Home health OT;Supervision/Assistance - 24 hour    Equipment Recommendations  Tub/shower bench    Recommendations for Other Services      Precautions / Restrictions Precautions Precautions: Fall Precaution Comments: hx of multiple recent falls       Mobility Bed Mobility Overal bed mobility: Modified Independent                Transfers Overall transfer level: Needs assistance Equipment used: Rolling walker (2 wheeled) Transfers: Sit to/from Stand Sit to Stand: Min guard;From elevated surface         General transfer comment: cues for safety as she approached chair with RW    Balance Overall balance assessment: Needs assistance Sitting-balance support: Feet supported;No upper extremity supported Sitting balance-Leahy Scale: Good     Standing balance  support: Bilateral upper extremity supported;During functional activity;Single extremity supported Standing balance-Leahy Scale: Poor                             ADL either performed or assessed with clinical judgement   ADL Overall ADL's : Needs assistance/impaired     Grooming: Min guard;Standing;Oral care;Brushing hair;Sitting Grooming Details (indicate cue type and reason): leans on elbow on sink, only able to tolerate one activity in standing         Upper Body Dressing : Set up;Sitting Upper Body Dressing Details (indicate cue type and reason): front opening gown                 Functional mobility during ADLs: Min guard;Rolling walker;Cueing for safety (short distances in room)       Vision       Perception     Praxis      Cognition Arousal/Alertness: Awake/alert Behavior During Therapy: WFL for tasks assessed/performed Overall Cognitive Status: Impaired/Different from baseline Area of Impairment: Memory;Safety/judgement                     Memory: Decreased short-term memory   Safety/Judgement: Decreased awareness of safety;Decreased awareness of deficits     General Comments: pt with better recall of conversations with physicians        Exercises     Shoulder Instructions       General Comments      Pertinent Vitals/ Pain       Pain Assessment: Faces Faces Pain Scale: Hurts  even more Pain Location: L hip Pain Descriptors / Indicators: Sore Pain Intervention(s): Monitored during session;Ice applied;Repositioned  Home Living                                          Prior Functioning/Environment              Frequency  Min 2X/week        Progress Toward Goals  OT Goals(current goals can now be found in the care plan section)  Progress towards OT goals: Progressing toward goals  Acute Rehab OT Goals Patient Stated Goal: stop falling OT Goal Formulation: With patient Time For Goal  Achievement: 04/11/20 Potential to Achieve Goals: Good  Plan Discharge plan remains appropriate    Co-evaluation          OT goals addressed during session: ADL's and self-care      AM-PAC OT "6 Clicks" Daily Activity     Outcome Measure   Help from another person eating meals?: None Help from another person taking care of personal grooming?: A Little Help from another person toileting, which includes using toliet, bedpan, or urinal?: A Little Help from another person bathing (including washing, rinsing, drying)?: A Little Help from another person to put on and taking off regular upper body clothing?: None Help from another person to put on and taking off regular lower body clothing?: A Little 6 Click Score: 20    End of Session Equipment Utilized During Treatment: Rolling walker;Gait belt  OT Visit Diagnosis: Unsteadiness on feet (R26.81);Other abnormalities of gait and mobility (R26.89);Hemiplegia and hemiparesis;Muscle weakness (generalized) (M62.81);Other symptoms and signs involving cognitive function   Activity Tolerance Patient limited by pain   Patient Left in chair;with call bell/phone within reach;with chair alarm set   Nurse Communication          Time: 3212-2482 OT Time Calculation (min): 16 min  Charges: OT General Charges $OT Visit: 1 Visit OT Treatments $Self Care/Home Management : 8-22 mins  [name], OTR/L Acute Rehabilitation Services Pager: [phone] Office: [phone]   [name] 03/30/2020, 11:19 AM

## 2020-03-30 NOTE — Progress Notes (Signed)
Progress Note    [name]  SWH:[id number] DOB: August 19, 1950  DOA: 03/27/2020 PCP: Mosie Lukes, MD    Brief Narrative:     Medical records reviewed and are as summarized below:  [name] is an 69 y.o. female  Here with UTI and weakness concerning for CVA.  MRI negative.   Hgb dropping and concern for hip hematoma  Assessment/Plan:   Principal Problem:   UTI (urinary tract infection) Active Problems:   Anemia   Atrial fibrillation (HCC)   Essential hypertension, benign   Recurrent falls  Suspected UTI- ESBL on 1st culture UA with positive nitrite, moderate amount of leukocytes, 21-50 WBCs, and rare bacteria.  No fever, leukocytosis, or signs of sepsis. -Ceftriaxone d/c'd in favor of fosphomycin  -1st urine culture esbl; 2nd culture insignificant grwoth  Left-sided weakness and word finding difficulty Recurrent falls Head CT negative for acute intracranial abnormality.  Neuro exam abnormal with slight left upper and lower extremity weakness, slight left pronator drift, and deviation of the tongue to the right. -MRI brain shows nothing acute but does show old Chronic hemorrhagic lacunar infarct within the right basal ganglia.: fall and left side weakness could be recrudesence from old strokes  Acute metabolic encephalopathy -resolved -in setting of UTI being treated  Normocytic anemia -  On previous labs, hemoglobin was in the 12-13 range.  Patient is not endorsing any symptoms of acute GI bleed.  Does have a large hematoma in her left hip region- monitor size of hematoma -monitor h/h -low B12: replete IM and then PO with close outpatient follow up  History of aortic insufficiency status post mechanical valve replacement -resume coumadin  Atrial fibrillation/flutter- permanent  Stable.  Has a pacemaker. -Continue amiodarone.   -resume coumadin  Mildly elevated transaminases AST 68 and ALT 61.  No elevation of alk phos or T bili.  No complaints of  abdominal pain, nausea, vomiting. -Right upper quadrant ultrasound unremarkable  Chronic combined systolic and diastolic CHF No signs of volume overload. -Hold Lasix   Non-insulin-dependent type 2 diabetes -SSI -hgb: 5.3  Hypotension  -holding home meds (lasix/BB) -gentle IVF overnight  AKI -? From hypotension -resolved  Hyperlipidemia -Continue fenofibrate, Crestor  GERD -Continue PPI   Depression, bipolar disorder -Continue home medications  obesity Body mass index is 32.62 kg/m.  Reviewed recent hospitalization in [facility]  Family Communication/Anticipated D/C date and plan/Code Status   DVT prophylaxis: warfarin Code Status: Full Code.  Daughter on phone Disposition Plan: Status is: Inpatient  Remains inpatient appropriate because:Inpatient level of care appropriate due to severity of illness   Dispo: The patient is from: Home              Anticipated d/c is to: Home              Anticipated d/c date is: 2 days              Patient currently is not medically stable to d/c. needs continued monitoring of Hgb - can d/c once stable         Medical Consultants:    neuro   Subjective:   No current complaints  Objective:    Vitals:   03/29/20 2352 03/30/20 0304 03/30/20 1157 03/30/20 1617  BP: (!) 95/57 138/64 109/83 (!) 143/74  Pulse: 76 74 76   Resp: $Remo'17 18 17   'GDDWy$ Temp: 98.1 F (36.7 C) 98 F (36.7 C) 98.6 F (37 C) 98.2 F (36.8 C)  TempSrc: Oral Oral  Oral  SpO2: 96% 96% 100%   Weight:      Height:        Intake/Output Summary (Last 24 hours) at 03/30/2020 1715 Last data filed at 03/30/2020 0200 Gross per 24 hour  Intake 550 ml  Output --  Net 550 ml   Filed Weights   03/27/20 1728 03/28/20 0125  Weight: 97.5 kg 97.3 kg    Exam:   General: Appearance:    Obese female in no acute distress     Lungs:     Clear to auscultation bilaterally, respirations unlabored  Heart:    Normal heart rate. +click  MS:   All extremities  are intact. Large dark bruise on left thigh  Neurologic:   Awake, alert    Data Reviewed:   I have personally reviewed following labs and imaging studies:  Labs: Labs show the following:   Basic Metabolic Panel: Recent Labs  Lab 03/27/20 1155 03/27/20 1155 03/27/20 1757 03/27/20 1757 03/29/20 0019 03/30/20 0024  NA 140  --  139  --  138 139  K 4.4   < > 4.0   < > 3.8 3.8  CL 102  --  103  --  104 106  CO2 26  --  24  --  24 24  GLUCOSE 83  --  92  --  139* 143*  BUN 17  --  15  --  24* 20  CREATININE 1.47*  --  1.30*  --  1.74* 1.29*  CALCIUM 9.5  --  8.7*  --  8.6* 8.7*   < > = values in this interval not displayed.   GFR Estimated Creatinine Clearance: 50.2 mL/min (A) (by C-G formula based on SCr of 1.29 mg/dL (H)). Liver Function Tests: Recent Labs  Lab 03/27/20 1155 03/27/20 1757  AST 61* 68*  ALT 63* 61*  ALKPHOS  --  26*  BILITOT 0.6 0.5  PROT 7.0 6.3*  ALBUMIN  --  3.9   No results for input(s): LIPASE, AMYLASE in the last 168 hours. No results for input(s): AMMONIA in the last 168 hours. Coagulation profile Recent Labs  Lab 03/27/20 1434 03/28/20 0422 03/29/20 0019 03/30/20 0024  INR 2.5 2.6* 2.6* 3.1*    CBC: Recent Labs  Lab 03/27/20 1155 03/28/20 0422 03/29/20 0019 03/30/20 0024  WBC 9.0 6.2 5.6 5.6  HGB 10.4* 9.5* 9.0* 8.7*  HCT 32.5* 30.3* 28.8* 28.0*  MCV 93.7 97.1 97.0 96.9  PLT 264 219 214 199   Cardiac Enzymes: No results for input(s): CKTOTAL, CKMB, CKMBINDEX, TROPONINI in the last 168 hours. BNP (last 3 results) No results for input(s): PROBNP in the last 8760 hours. CBG: Recent Labs  Lab 03/29/20 1600 03/29/20 1952 03/30/20 0615 03/30/20 1151 03/30/20 1637  GLUCAP 84 90 86 106* 96   D-Dimer: No results for input(s): DDIMER in the last 72 hours. Hgb A1c: Recent Labs    03/28/20 0422  HGBA1C 5.3   Lipid Profile: Recent Labs    03/29/20 0019  CHOL 126  HDL 51  LDLCALC 45  TRIG 148  CHOLHDL 2.5    Thyroid function studies: No results for input(s): TSH, T4TOTAL, T3FREE, THYROIDAB in the last 72 hours.  Invalid input(s): FREET3 Anemia work up: Recent Labs    03/28/20 0422  VITAMINB12 162*  FOLATE 21.1  FERRITIN 123  TIBC 365  IRON 48  RETICCTPCT 1.7   Sepsis Labs: Recent Labs  Lab 03/27/20 1155 03/28/20 0422 03/29/20  0019 03/30/20 0024  WBC 9.0 6.2 5.6 5.6    Microbiology Recent Results (from the past 240 hour(s))  Urine Culture     Status: Abnormal   Collection Time: 03/27/20 11:55 AM   Specimen: Urine  Result Value Ref Range Status   MICRO NUMBER: [id number]  Final   SPECIMEN QUALITY: Adequate  Final   Sample Source NOT GIVEN  Final   STATUS: FINAL  Final   ISOLATE 1: ESBL Escherichia coli (A)  Final    Comment: Greater than 100,000 CFU/mL of Escherichia coli (ESBL) ESBL RESULT:        The organism has been confirmed as an ESBL producer.      Susceptibility   Esbl escherichia coli - URINE CULTURE, REFLEX    AMOX/CLAVULANIC 16 Intermediate     AMPICILLIN* >=32 Resistant      * Extended spectrum beta-lactamase (ESBL) producingorganisms demonstrate decreased activity withpenicillins, cephalosporins and aztreonam.    AMPICILLIN/SULBACTAM >=32 Resistant     CEFAZOLIN* >=64 Resistant      * Extended spectrum beta-lactamase (ESBL) producingorganisms demonstrate decreased activity withpenicillins, cephalosporins and aztreonam.For uncomplicated UTI caused by E. coli,K. pneumoniae or P. mirabilis: Cefazolin issusceptible if MIC <32 mcg/mL and predictssusceptible to the oral agents cefaclor, cefdinir,cefpodoxime, cefprozil, cefuroxime, cephalexinand loracarbef.    CEFEPIME >=64 Resistant     CEFTRIAXONE >=64 Resistant     CIPROFLOXACIN >=4 Resistant     LEVOFLOXACIN >=8 Resistant     ERTAPENEM <=0.5 Sensitive     IMIPENEM <=0.25 Sensitive     NITROFURANTOIN <=16 Sensitive     PIP/TAZO 8 Sensitive     TRIMETH/SULFA* >=320 Resistant      * Extended spectrum  beta-lactamase (ESBL) producingorganisms demonstrate decreased activity withpenicillins, cephalosporins and aztreonam.For uncomplicated UTI caused by E. coli,K. pneumoniae or P. mirabilis: Cefazolin issusceptible if MIC <32 mcg/mL and predictssusceptible to the oral agents cefaclor, cefdinir,cefpodoxime, cefprozil, cefuroxime, cephalexinand loracarbef.Legend:S = Susceptible  I = IntermediateR = Resistant  NS = Not susceptible* = Not tested  NR = Not reported**NN = See antimicrobic comments  Respiratory Panel by RT PCR (Flu A&B, Covid) - Nasopharyngeal Swab     Status: None   Collection Time: 03/27/20  7:35 PM   Specimen: Nasopharyngeal Swab  Result Value Ref Range Status   SARS Coronavirus 2 by RT PCR NEGATIVE NEGATIVE Final    Comment: (NOTE) SARS-CoV-2 target nucleic acids are NOT DETECTED.  The SARS-CoV-2 RNA is generally detectable in upper respiratoy specimens during the acute phase of infection. The lowest concentration of SARS-CoV-2 viral copies this assay can detect is 131 copies/mL. A negative result does not preclude SARS-Cov-2 infection and should not be used as the sole basis for treatment or other patient management decisions. A negative result may occur with  improper specimen collection/handling, submission of specimen other than nasopharyngeal swab, presence of viral mutation(s) within the areas targeted by this assay, and inadequate number of viral copies (<131 copies/mL). A negative result must be combined with clinical observations, patient history, and epidemiological information. The expected result is Negative.  Fact Sheet for Patients:  https://www.moore.com/  Fact Sheet for Healthcare Providers:  https://www.young.biz/  This test is no t yet approved or cleared by the [place] FDA and  has been authorized for detection and/or diagnosis of SARS-CoV-2 by FDA under an Emergency Use Authorization (EUA). This EUA will remain    in effect (meaning this test can be used) for the duration of the COVID-19 declaration under Section 564(b)(1) of the Act,  21 U.S.C. section 360bbb-3(b)(1), unless the authorization is terminated or revoked sooner.     Influenza A by PCR NEGATIVE NEGATIVE Final   Influenza B by PCR NEGATIVE NEGATIVE Final    Comment: (NOTE) The Xpert Xpress SARS-CoV-2/FLU/RSV assay is intended as an aid in  the diagnosis of influenza from Nasopharyngeal swab specimens and  should not be used as a sole basis for treatment. Nasal washings and  aspirates are unacceptable for Xpert Xpress SARS-CoV-2/FLU/RSV  testing.  Fact Sheet for Patients: PinkCheek.be  Fact Sheet for Healthcare Providers: GravelBags.it  This test is not yet approved or cleared by the [place] FDA and  has been authorized for detection and/or diagnosis of SARS-CoV-2 by  FDA under an Emergency Use Authorization (EUA). This EUA will remain  in effect (meaning this test can be used) for the duration of the  Covid-19 declaration under Section 564(b)(1) of the Act, 21  U.S.C. section 360bbb-3(b)(1), unless the authorization is  terminated or revoked. Performed at [facility], [place] [address], [place], [place] [address]   MRSA PCR Screening     Status: None   Collection Time: 03/28/20  2:22 AM   Specimen: Nasal Mucosa; Nasopharyngeal  Result Value Ref Range Status   MRSA by PCR NEGATIVE NEGATIVE Final    Comment:        The GeneXpert MRSA Assay (FDA approved for NASAL specimens only), is one component of a comprehensive MRSA colonization surveillance program. It is not intended to diagnose MRSA infection nor to guide or monitor treatment for MRSA infections. Performed at [facility], [place] [address], [place], [place] [address]   Culture, Urine     Status: Abnormal   Collection Time: 03/28/20  1:15 PM   Specimen: Urine, Clean Catch  Result Value Ref  Range Status   Specimen Description URINE, CLEAN CATCH  Final   Special Requests NONE  Final   Culture (A)  Final    <10,000 COLONIES/mL INSIGNIFICANT GROWTH Performed at [facility], [place] [address], [place], [place] [address]    Report Status 03/29/2020 FINAL  Final    Procedures and diagnostic studies:  No results found.  Medications:   . amiodarone  200 mg Oral BID  . aspirin EC  81 mg Oral Daily  . fenofibrate  160 mg Oral Daily  . influenza vaccine adjuvanted  0.5 mL Intramuscular Tomorrow-1000  . insulin aspart  0-5 Units Subcutaneous QHS  . insulin aspart  0-9 Units Subcutaneous TID WC  . lamoTRIgine  100 mg Oral Daily  . lamoTRIgine  200 mg Oral QHS  . lidocaine  1 patch Transdermal Q24H  . OXcarbazepine  150 mg Oral BID  . pantoprazole  20 mg Oral BH-qamhs  . QUEtiapine  600 mg Oral QPM  . rOPINIRole  0.5 mg Oral QHS  . rosuvastatin  40 mg Oral Daily  . venlafaxine XR  75 mg Oral Q breakfast  . vitamin B-12  1,000 mcg Oral Daily  . warfarin  2.5 mg Oral ONCE-1600  . Warfarin - Pharmacist Dosing Inpatient   Does not apply q1600   Continuous Infusions:    LOS: 2 days   Geradine Girt  [facility]   How to contact the [facility] Attending or Consulting provider [place] or covering provider during after hours [place], for this patient?  1. Check the care team in [facility] and look for a) attending/consulting TR[facility] provider listed and b) the [facility] team  listed 2. Log into [email or web address] and use [place]'s universal password to access. If you do not have the password, please contact the hospital operator. 3. Locate the [facility] provider you are looking for under [facility] and page to a number that you can be directly reached. 4. If you still have difficulty reaching the provider, please page the [facility] (Director on Call) for the Hospitalists listed on amion for assistance.  03/30/2020, 5:15 PM

## 2020-03-31 DIAGNOSIS — E1142 Type 2 diabetes mellitus with diabetic polyneuropathy: Secondary | ICD-10-CM

## 2020-03-31 LAB — GLUCOSE, CAPILLARY
Glucose-Capillary: 92 mg/dL (ref 70–99)
Glucose-Capillary: 111 mg/dL — ABNORMAL HIGH (ref 70–99)

## 2020-03-31 LAB — CBC
HCT: 31.3 % — ABNORMAL LOW (ref 36.0–46.0)
Hemoglobin: 9.8 g/dL — ABNORMAL LOW (ref 12.0–15.0)
MCH: 30.6 pg (ref 26.0–34.0)
MCHC: 31.3 g/dL (ref 30.0–36.0)
MCV: 97.8 fL (ref 80.0–100.0)
Platelets: 228 10*3/uL (ref 150–400)
RBC: 3.2 MIL/uL — ABNORMAL LOW (ref 3.87–5.11)
RDW: 15 % (ref 11.5–15.5)
WBC: 6.4 10*3/uL (ref 4.0–10.5)
nRBC: 0 % (ref 0.0–0.2)

## 2020-03-31 LAB — PROTIME-INR
INR: 3 — ABNORMAL HIGH (ref 0.8–1.2)
Prothrombin Time: 30.5 seconds — ABNORMAL HIGH (ref 11.4–15.2)

## 2020-03-31 MED ORDER — CYANOCOBALAMIN 1000 MCG PO TABS
1000.0000 ug | ORAL_TABLET | Freq: Every day | ORAL | 0 refills | Status: DC
Start: 2020-04-01 — End: 2021-06-27

## 2020-03-31 MED ORDER — LIDOCAINE 5 % EX PTCH
1.0000 | MEDICATED_PATCH | CUTANEOUS | 0 refills | Status: DC
Start: 2020-03-31 — End: 2020-04-25

## 2020-03-31 MED ORDER — METOPROLOL SUCCINATE ER 25 MG PO TB24: 12.5000 mg | ORAL_TABLET | Freq: Every day | ORAL | 1 refills | Status: DC

## 2020-03-31 MED ORDER — FUROSEMIDE 40 MG PO TABS: 40.0000 mg | ORAL_TABLET | Freq: Every day | ORAL | 3 refills | Status: DC | PRN

## 2020-03-31 MED ORDER — WARFARIN SODIUM 5 MG PO TABS: 5.0000 mg | ORAL_TABLET | Freq: Once | ORAL | Status: DC

## 2020-03-31 NOTE — TOC Transition Note (Addendum)
Transition of Care [facility]) - CM/SW Discharge Note   Patient Details  Name: MOYINOLUWA DAWE MRN: [id number] Date of Birth: 10-06-1950  [facility] CM/SW Contact:  Konrad Penta, RN Phone Number: [phone] 03/31/2020, 2:24 PM   Clinical Narrative:  Patient slated for discharge to home today. Spoke with Corene Cornea with [facility] to make aware. Home health orders are in place.     Final next level of care: [facility] Barriers to Discharge: Continued Medical Work up   Patient Goals and CMS Choice Patient states their goals for this hospitalization and ongoing recovery are:: get better CMS Medicare.gov Compare Post Acute Care list provided to:: Patient Represenative (must comment) Choice offered to / list presented to : Adult Children  Discharge Placement  Home with home health with [place]                                       DME Agency: NA       HH Arranged: PT, OT [place] Agency: [place] ([place]) Date [place]: 03/30/20 and 03/31/20 Time [place]: 1886; 11/6 at 1428 Representative spoke with at [place]: Tandy Gaw on 03/31/20    Marthenia Rolling, MSN, RN,BSN [facility] Case Manager [phone]

## 2020-03-31 NOTE — Progress Notes (Signed)
[name] for warfarin Indication: AVR/Afib  Allergies  Allergen Reactions  . Mucinex [Guaifenesin Er] Other (See Comments)    Severe headaches   . Keflex [Cephalexin] Other (See Comments)    Headaches and dizziness  . Sulfa Antibiotics Other (See Comments)    headaches  . Sulfonamide Derivatives Other (See Comments)    Headaches     Patient Measurements: Height: 5\' 8"  (172.7 cm) Weight: 97.3 kg (214 lb 8.1 oz) IBW/kg (Calculated) : 63.9  Vital Signs: Temp: 98.8 F (37.1 C) (11/06 1124) Temp Source: Oral (11/06 1124) BP: 138/78 (11/06 1124) Pulse Rate: 89 (11/06 0824)  Labs: Recent Labs    03/29/20 0019 03/29/20 0019 03/30/20 0024 03/31/20 0042  HGB 9.0*   < > 8.7* 9.8*  HCT 28.8*  --  28.0* 31.3*  PLT 214  --  199 228  LABPROT 26.8*  --  31.1* 30.5*  INR 2.6*  --  3.1* 3.0*  CREATININE 1.74*  --  1.29*  --    < > = values in this interval not displayed.    Estimated Creatinine Clearance: 50.2 mL/min (A) (by C-G formula based on SCr of 1.29 mg/dL (H)).   Medical History: Past Medical History:  Diagnosis Date  . ANEMIA 05/28/2010  . AORTIC VALVE REPLACEMENT, HX OF 03/11/2010   Mechanical prosthesis  . BIPOLAR AFFECTIVE DISORDER 03/11/2010  . Depression   . Diverticulitis 12/18/2010  . FIBROIDS, UTERUS 03/11/2010  . Gout 09/04/2016  . Grave's disease 8-12  . Hyperlipidemia associated with type 2 diabetes mellitus ([place]) 06/05/2016  . Hypertension   . Low back pain 11/02/2017  . Migraine 06/05/2016  . Mixed hyperlipidemia 03/11/2010  . Obesity   . Skin cancer   . Syncope 08/22/2019  . UTI (lower urinary tract infection) 08/14/2011    Assessment: 54 yoF on warfarin for hx mAVR + AFib admitted with fall and found to have L thigh hematoma. Warfarin initially held on admit, now restarted 11/3, INR remains therapeutic at 3.0. CBC stable. No overt bleeding noted.  *Home warfarin dose = 2.5mg  Mon/Fri, 5mg  AODs  Goal of  Therapy:  INR 2.5-3.5   Plan:  Warfarin 5mg  PO x1 tonight Daily PT/INR Watch hematoma   Richardine Service, PharmD PGY2 Cardiology Pharmacy Resident Phone: [phone] 03/31/2020  3:16 PM  Please check [email or web address] for unit-specific pharmacy phone numbers.

## 2020-03-31 NOTE — Discharge Summary (Signed)
Physician Discharge Summary  [name] ZLD:[id number] DOB: Feb 06, 1951 DOA: 03/27/2020  PCP: Mosie Lukes, MD  Admit date: 03/27/2020 Discharge date: 03/31/2020  Admitted From: home Discharge disposition: home   Recommendations for Outpatient Follow-Up:   1. Needs frequent follow up with PCP 2. Close monitoring of INR 3. Polypharmacy: ? Referral to geripsych vs current PA 4. Home health 5. Outpatient neuro cognitive evaluation  6. Monitor B12 levels to ensure replacement 7. Cbc 1 week   Discharge Diagnosis:   Principal Problem:   UTI (urinary tract infection) Active Problems:   Anemia   Atrial fibrillation (HCC)   Essential hypertension, benign   Recurrent falls    Discharge Condition: Improved.  Diet recommendation: Low sodium, heart healthy  Wound care: None.  Code status: Full.   History of Present Illness:   [name] is a 69 y.o. female with medical history significant of aortic insufficiency status post mechanical valve replacement on Coumadin, atrial fibrillation/flutter status post PPM, chronic combined systolic and diastolic CHF, noninsulin-dependent type II diabetes, migraine headaches, anemia, recurrent UTIs, hypertension, hyperlipidemia, gout, depression, bipolar disorder presented to ED with complaint of left-sided weakness, word finding difficulty, and multiple falls in the past 24 hours.  Family reported that patient was discharged from rehab about 4 to 6 weeks ago.  Since she has returned from rehab she has been using a walker to ambulate and has been mentating normally.  Yesterday family noticed that she was forgetting her words and not her normal self.  Since yesterday she has had 4 falls even when using her walker and appears to have weakness in her left leg.  Patient was seen by her doctor today as she has developed foul-smelling urine.  Dipstick urinalysis done in her doctor's office did show small leukocytes but they were concerned  that something else is going on and recommended patient coming into the emergency room.  Labs done at her PCPs office showing WBC 9.0, hemoglobin 10.4, hematocrit 32.5, platelet 264k.  Her INR was previously supratherapeutic and dose of Coumadin was adjusted.  Repeat INR done at her PCPs office today was 2.5.  Patient states she has had multiple falls over the past few months.  Also reports word finding difficulty since summer.  States for the past few days she has felt weakness in her left arm and leg.  In the past 24 hours, she has had several falls.  States she has injured her left hip but has not had any other significant injuries from these falls.  Denies any head injury or loss of consciousness.  States her urine has been foul-smelling which is what happens whenever she gets a UTI.  Denies fevers, cough, shortness of breath, chest pain, nausea, vomiting, abdominal pain, or diarrhea.   Hospital Course by Problem:   Suspected UTI- ESBL on 1st culture UA with positive nitrite, moderate amount of leukocytes, 21-50 WBCs, and rare bacteria. No fever, leukocytosis, or signs of sepsis. -Ceftriaxone d/c'd in favor of fosphomycin  -1st urine culture esbl; 2nd culture insignificant grwoth -PVR zero  Left-sided weakness and word finding difficulty Recurrent falls Head CT negative for acute intracranial abnormality.Neuro exam abnormal with slight left upper and lower extremity weakness, slight left pronator drift, and deviation of the tongue to the right. -MRI brain shows nothing acute but does show old Chronic hemorrhagic lacunar infarct within the right basal ganglia.: fall and left side weakness could be recrudesence from old strokes  Acute metabolic encephalopathy -  resolved -in setting of UTI being treated  Normocytic anemia - On previous labs, hemoglobin was in the 12-13 range. Patient is not endorsing any symptoms of acute GI bleed.Does have a large hematoma in her left hip region-  monitor size of hematoma -h/h stable -low B12: replete IM and then PO with close outpatient follow up  History of aortic insufficiency status post mechanical valve replacement -resume coumadin  Atrial fibrillation/flutter- permanent  Stable. Has a pacemaker. -Continue amiodarone.  -resume coumadin  Mildly elevated transaminases AST 68 and ALT 61. No elevation of alk phos or T bili. No complaints of abdominal pain, nausea, vomiting. -Right upper quadrant ultrasound unremarkable  Chronic combined systolic and diastolic CHF No signs of volume overload. -change lasix to PRN   Non-insulin-dependent type 2 diabetes -SSI -hgb: 5.3  Hypotension  -resolved with change in BP meds  AKI -? From hypotension -resolved  Hyperlipidemia -Continue fenofibrate, Crestor  GERD -Continue PPI   Depression, bipolar disorder -Continue home medications  obesity Body mass index is 32.62 kg/m.    Medical Consultants:    neurology  Discharge Exam:   Vitals:   03/31/20 0824 03/31/20 1124  BP: (!) 104/54 138/78  Pulse: 89   Resp: 10   Temp: 98.3 F (36.8 C) 98.8 F (37.1 C)  SpO2: 97%    Vitals:   03/31/20 0333 03/31/20 0808 03/31/20 0824 03/31/20 1124  BP: 104/62 93/76 (!) 104/54 138/78  Pulse: 87  89   Resp: 15  10   Temp: 98 F (36.7 C) 98.3 F (36.8 C) 98.3 F (36.8 C) 98.8 F (37.1 C)  TempSrc: Oral Oral  Oral  SpO2: 96%  97%   Weight:      Height:        General exam: Appears calm and comfortable.    The results of significant diagnostics from this hospitalization (including imaging, microbiology, ancillary and laboratory) are listed below for reference.     Procedures and Diagnostic Studies:   CT Head Wo Contrast  Result Date: 03/27/2020 CLINICAL DATA:  Neuro deficit, acute, stroke suspected Weakness. EXAM: CT HEAD WITHOUT CONTRAST TECHNIQUE: Contiguous axial images were obtained from the base of the skull through the vertex without  intravenous contrast. COMPARISON:  Head CT 08/21/2019 FINDINGS: Brain: Stable degree of atrophy and chronic small vessel ischemia. No intracranial hemorrhage, mass effect, or midline shift. No hydrocephalus. The basilar cisterns are patent. No evidence of territorial infarct or acute ischemia. No extra-axial or intracranial fluid collection. Vascular: Atherosclerosis of skullbase vasculature without hyperdense vessel or abnormal calcification. Skull: No fracture or focal lesion. Sinuses/Orbits: Paranasal sinuses and mastoid air cells are clear. The visualized orbits are unremarkable. Other: None. IMPRESSION: 1. No acute intracranial abnormality. 2. Stable atrophy and chronic small vessel ischemia. Electronically Signed   By: Keith Rake M.D.   On: 03/27/2020 18:45   MR ANGIO HEAD WO CONTRAST  Result Date: 03/28/2020 CLINICAL DATA:  Neuro deficit, acute, stroke suspected. Additional history provided: Left-sided weakness, word finding difficulty, multiple falls. EXAM: MRI HEAD WITHOUT CONTRAST MRA HEAD WITHOUT CONTRAST TECHNIQUE: Multiplanar, multiecho pulse sequences of the brain and surrounding structures were obtained without intravenous contrast. Angiographic images of the head were obtained using MRA technique without contrast. COMPARISON:  Noncontrast head CT 03/27/2020. CT angiogram head/neck 08/21/2019. FINDINGS: MRI HEAD FINDINGS Mild-to-moderate intermittent motion degradation. Brain: Mild generalized cerebral atrophy. Chronic hemorrhagic lacunar infarct within the right lentiform nucleus. Mild multifocal T2/FLAIR hyperintensity within the cerebral white matter is nonspecific, but compatible with  chronic small vessel ischemic disease. This includes a chronic lacunar infarct within the left parietal white matter (series 7, image 15). There is no acute infarct. No evidence of intracranial mass. No extra-axial fluid collection. No midline shift. Vascular: Reported below. Skull and upper cervical spine:  No focal marrow lesion. Sinuses/Orbits: Visualized orbits show no acute finding. Mild ethmoid sinus mucosal thickening. No significant mastoid effusion. MRA HEAD FINDINGS The examination is moderate to severely motion degraded, significantly limiting evaluation for stenoses and for intracranial aneurysms. No intracranial large vessel occlusion. Apparent high-grade stenosis within the V4 right vertebral artery (series 1203, image 6). Within described limitations, no intracranial aneurysm is identified. IMPRESSION: MRI head: 1. Mild-to-moderate intermittent motion degradation. 2. No evidence of acute intracranial abnormality, including acute infarction. 3. Chronic hemorrhagic lacunar infarct within the right basal ganglia. 4. Chronic lacunar infarct within the left parietal white matter. 5. Background mild generalized cerebral atrophy and chronic small vessel ischemic disease. MRA head: 1. Moderate to severely motion degraded examination, significantly limiting evaluation for intracranial arterial stenoses. 2. No intracranial large vessel occlusion. 3. Apparent high-grade stenosis within the V4 right vertebral artery. Electronically Signed   By: Kellie Simmering DO   On: 03/28/2020 11:18   MR BRAIN WO CONTRAST  Result Date: 03/28/2020 CLINICAL DATA:  Neuro deficit, acute, stroke suspected. Additional history provided: Left-sided weakness, word finding difficulty, multiple falls. EXAM: MRI HEAD WITHOUT CONTRAST MRA HEAD WITHOUT CONTRAST TECHNIQUE: Multiplanar, multiecho pulse sequences of the brain and surrounding structures were obtained without intravenous contrast. Angiographic images of the head were obtained using MRA technique without contrast. COMPARISON:  Noncontrast head CT 03/27/2020. CT angiogram head/neck 08/21/2019. FINDINGS: MRI HEAD FINDINGS Mild-to-moderate intermittent motion degradation. Brain: Mild generalized cerebral atrophy. Chronic hemorrhagic lacunar infarct within the right lentiform nucleus.  Mild multifocal T2/FLAIR hyperintensity within the cerebral white matter is nonspecific, but compatible with chronic small vessel ischemic disease. This includes a chronic lacunar infarct within the left parietal white matter (series 7, image 15). There is no acute infarct. No evidence of intracranial mass. No extra-axial fluid collection. No midline shift. Vascular: Reported below. Skull and upper cervical spine: No focal marrow lesion. Sinuses/Orbits: Visualized orbits show no acute finding. Mild ethmoid sinus mucosal thickening. No significant mastoid effusion. MRA HEAD FINDINGS The examination is moderate to severely motion degraded, significantly limiting evaluation for stenoses and for intracranial aneurysms. No intracranial large vessel occlusion. Apparent high-grade stenosis within the V4 right vertebral artery (series 1203, image 6). Within described limitations, no intracranial aneurysm is identified. IMPRESSION: MRI head: 1. Mild-to-moderate intermittent motion degradation. 2. No evidence of acute intracranial abnormality, including acute infarction. 3. Chronic hemorrhagic lacunar infarct within the right basal ganglia. 4. Chronic lacunar infarct within the left parietal white matter. 5. Background mild generalized cerebral atrophy and chronic small vessel ischemic disease. MRA head: 1. Moderate to severely motion degraded examination, significantly limiting evaluation for intracranial arterial stenoses. 2. No intracranial large vessel occlusion. 3. Apparent high-grade stenosis within the V4 right vertebral artery. Electronically Signed   By: Kellie Simmering DO   On: 03/28/2020 11:18   DG CHEST PORT 1 VIEW  Result Date: 03/28/2020 CLINICAL DATA:  Weakness and cough over the last day. EXAM: PORTABLE CHEST 1 VIEW COMPARISON:  01/09/2020 FINDINGS: Previous median sternotomy and valve replacement, probably aortic. Chronic cardiomegaly. Dual lead pacemaker in place. The lungs are clear. The vascularity is  normal. No visible effusion. Surgical clips right axilla. IMPRESSION: No active disease. Previous valve replacement. Dual lead pacemaker.  Electronically Signed   By: Nelson Chimes M.D.   On: 03/28/2020 14:11   ECHOCARDIOGRAM COMPLETE  Result Date: 03/28/2020    ECHOCARDIOGRAM REPORT   Patient Name:   SHERALYN PINEGAR Date of Exam: 03/28/2020 Medical Rec #:  [id number]     Height:       68.0 in Accession #:    [id number]    Weight:       214.5 lb Date of Birth:  11-Sep-1950      BSA:          2.105 m Patient Age:    29 years      BP:           100/53 mmHg Patient Gender: F             HR:           67 bpm. Exam Location:  Inpatient Procedure: 2D Echo Indications:    TIA 435.9  History:        Patient has prior history of Echocardiogram examinations, most                 recent 08/23/2019. Pacemaker; Risk Factors:Dyslipidemia,                 Diabetes, Hypertension and Former Smoker. Aortic valve                 replacement (St Jude mechanical), Replacement of ascending                 aortic aneurysm and proximal hemiarch replacement using a 28-mm                 Hemashield tube graft. Aortic graft-to-innominate artery bypass.                 05/22/2008.                 Aortic Valve: 25 mm St. Jude mechanical valved conduit valve is                 present in the aortic position. Procedure Date: 05/22/2008.  Sonographer:    Jannett Celestine RDCS (AE) Referring Phys: [id number] Rosalin Hawking IMPRESSIONS  1. Left ventricular ejection fraction, by estimation, is 50 to 55%. The left ventricle has low normal function. Left ventricular endocardial border not optimally defined to evaluate regional wall motion. Left ventricular diastolic parameters are indeterminate.  2. Right ventricular systolic function is mildly reduced. The right ventricular size is normal. Tricuspid regurgitation signal is inadequate for assessing PA pressure.  3. The mitral valve is degenerative. No evidence of mitral valve regurgitation. No evidence of mitral  stenosis. Moderate mitral annular calcification.  4. The aortic valve has been repaired/replaced. Aortic valve regurgitation is trivial. No aortic stenosis is present. Mechanical valve leaflets not well visualized. There is a 25 mm St. Jude mechanical valved conduit valve present in the aortic position. Procedure Date: 05/22/2008. Echo findings are consistent with normal structure and function of the aortic valve prosthesis. Aortic valve mean gradient measures 7.0 mmHg.  5. Aortic root/ascending aorta has been repaired/replaced. Grossly normal appearance in proximal segment, not well visualized beyond proximal ascending aorta.  6. The inferior vena cava is normal in size with greater than 50% respiratory variability, suggesting right atrial pressure of 3 mmHg. Conclusion(s)/Recommendation(s): No intracardiac source of embolism detected on this transthoracic study. A transesophageal echocardiogram is recommended to exclude cardiac source of embolism if clinically indicated. FINDINGS  Left Ventricle:  Left ventricular ejection fraction, by estimation, is 50 to 55%. The left ventricle has low normal function. Left ventricular endocardial border not optimally defined to evaluate regional wall motion. The left ventricular internal cavity  size was normal in size. There is no left ventricular hypertrophy. Left ventricular diastolic parameters are indeterminate. Right Ventricle: The right ventricular size is normal. No increase in right ventricular wall thickness. Right ventricular systolic function is mildly reduced. Tricuspid regurgitation signal is inadequate for assessing PA pressure. Left Atrium: Left atrial size was not well visualized. Right Atrium: Right atrial size was not well visualized. Pericardium: There is no evidence of pericardial effusion. Mitral Valve: The mitral valve is degenerative in appearance. Moderate mitral annular calcification. No evidence of mitral valve regurgitation. No evidence of mitral  valve stenosis. Tricuspid Valve: The tricuspid valve is not well visualized. Tricuspid valve regurgitation is trivial. No evidence of tricuspid stenosis. Aortic Valve: Normal aortic valve prosthesis. AT 81 msec, DVI 0.47, EOA 2.3 cm2, iEOA 1.1 cm2/m2. The aortic valve has been repaired/replaced. Aortic valve regurgitation is trivial. No aortic stenosis is present. Aortic valve mean gradient measures 7.0 mmHg. Aortic valve peak gradient measures 16.1 mmHg. Aortic valve area, by VTI measures 2.30 cm. There is a 25 mm St. Jude mechanical valved conduit valve present in the aortic position. Procedure Date: 05/22/2008. Echo findings are consistent with normal structure and function of the aortic valve prosthesis. Pulmonic Valve: The pulmonic valve was not well visualized. Pulmonic valve regurgitation is not visualized. No evidence of pulmonic stenosis. Aorta: The aortic root/ascending aorta has been repaired/replaced and the ascending aorta was not well visualized. Venous: The inferior vena cava is normal in size with greater than 50% respiratory variability, suggesting right atrial pressure of 3 mmHg. IAS/Shunts: The interatrial septum was not well visualized. Additional Comments: A pacer wire is visualized in the right atrium and right ventricle.  LEFT VENTRICLE PLAX 2D LVIDd:         5.20 cm LVIDs:         3.80 cm LV PW:         1.00 cm LV IVS:        1.00 cm LVOT diam:     2.50 cm LV SV:         72 LV SV Index:   34 LVOT Area:     4.91 cm  RIGHT VENTRICLE TAPSE (M-mode): 1.8 cm LEFT ATRIUM            Index LA diam:      3.80 cm  1.80 cm/m LA Vol (A2C): 115.0 ml 54.62 ml/m  AORTIC VALVE AV Area (Vmax):    2.05 cm AV Area (Vmean):   1.90 cm AV Area (VTI):     2.30 cm AV Vmax:           200.50 cm/s AV Vmean:          146.000 cm/s AV VTI:            0.312 m AV Peak Grad:      16.1 mmHg AV Mean Grad:      7.0 mmHg LVOT Vmax:         83.60 cm/s LVOT Vmean:        56.500 cm/s LVOT VTI:          0.146 m LVOT/AV VTI  ratio: 0.47  AORTA Ao Root diam: 2.90 cm MITRAL VALVE MV Area (PHT): 2.87 cm    SHUNTS MV Decel Time: 264 msec  Systemic VTI:  0.15 m MV E velocity: 99.00 cm/s  Systemic Diam: 2.50 cm Cherlynn Kaiser MD Electronically signed by Cherlynn Kaiser MD Signature Date/Time: 03/28/2020/9:34:58 PM    Final    DG HIP UNILAT WITH PELVIS 2-3 VIEWS LEFT  Result Date: 03/27/2020 CLINICAL DATA:  Acute left hip pain after multiple falls. EXAM: DG HIP (WITH OR WITHOUT PELVIS) 2-3V LEFT COMPARISON:  None. FINDINGS: There is no evidence of hip fracture or dislocation. There is no evidence of arthropathy or other focal bone abnormality. IMPRESSION: Negative. Electronically Signed   By: Marijo Conception M.D.   On: 03/27/2020 18:28   VAS US CAROTID  Result Date: 03/29/2020 Carotid Arterial Duplex Study Indications:       TIA. Risk Factors:      Hypertension, hyperlipidemia, Diabetes. Comparison Study:  no prior Performing Technologist: Abram Sander RVS  Examination Guidelines: A complete evaluation includes B-mode imaging, spectral Doppler, color Doppler, and power Doppler as needed of all accessible portions of each vessel. Bilateral testing is considered an integral part of a complete examination. Limited examinations for reoccurring indications may be performed as noted.  Right Carotid Findings: +----------+--------+--------+--------+------------------+--------+           PSV cm/sEDV cm/sStenosisPlaque DescriptionComments +----------+--------+--------+--------+------------------+--------+ CCA Prox  69      13              heterogenous               +----------+--------+--------+--------+------------------+--------+ CCA Distal73      17              heterogenous               +----------+--------+--------+--------+------------------+--------+ ICA Prox  84      30      1-39%   heterogenous               +----------+--------+--------+--------+------------------+--------+ ICA Distal127     40                                          +----------+--------+--------+--------+------------------+--------+ ECA       75      6                                          +----------+--------+--------+--------+------------------+--------+ +----------+--------+-------+--------+-------------------+           PSV cm/sEDV cmsDescribeArm Pressure (mmHG) +----------+--------+-------+--------+-------------------+ [id number]                                         +----------+--------+-------+--------+-------------------+ +---------+--------+--+--------+-+---------+ VertebralPSV cm/s40EDV cm/s7Antegrade +---------+--------+--+--------+-+---------+  Left Carotid Findings: +----------+--------+--------+--------+------------------+--------+           PSV cm/sEDV cm/sStenosisPlaque DescriptionComments +----------+--------+--------+--------+------------------+--------+ CCA Prox  99      14              heterogenous               +----------+--------+--------+--------+------------------+--------+ CCA Distal75      17              heterogenous               +----------+--------+--------+--------+------------------+--------+ ICA Prox  146     43  1-39%   heterogenous               +----------+--------+--------+--------+------------------+--------+ ICA Distal93      23                                         +----------+--------+--------+--------+------------------+--------+ ECA       85      9                                          +----------+--------+--------+--------+------------------+--------+ +----------+--------+--------+--------+-------------------+           PSV cm/sEDV cm/sDescribeArm Pressure (mmHG) +----------+--------+--------+--------+-------------------+ [id number]                                         +----------+--------+--------+--------+-------------------+ +---------+--------+--+--------+--+---------+ VertebralPSV  cm/s66EDV cm/s21Antegrade +---------+--------+--+--------+--+---------+   Summary: Right Carotid: Velocities in the right ICA are consistent with a 1-39% stenosis. Left Carotid: Velocities in the left ICA are consistent with a 1-39% stenosis. Vertebrals: Bilateral vertebral arteries demonstrate antegrade flow. *See table(s) above for measurements and observations.  Electronically signed by Delia Heady MD on 03/29/2020 at 8:20:21 AM.    Final    US Abdomen Limited RUQ (LIVER/GB)  Result Date: 03/27/2020 CLINICAL DATA:  Elevated liver function tests. Status post cholecystectomy 30 years ago per patient. EXAM: ULTRASOUND ABDOMEN LIMITED RIGHT UPPER QUADRANT COMPARISON:  CT abdomen pelvis 10/16/2015. FINDINGS: Gallbladder: Post cholecystectomy. Common bile duct: Diameter: 8 mm Liver: No focal lesion identified. Within normal limits in parenchymal echogenicity. Portal vein is patent on color Doppler imaging with normal direction of blood flow towards the liver. Other: Visualized right kidney demonstrates no hydronephrosis. IMPRESSION: Unremarkable right upper quadrant ultrasound in a patient that is status post cholecystectomy. Electronically Signed   By: Tish Frederickson M.D.   On: 03/27/2020 23:53     Labs:   Basic Metabolic Panel: Recent Labs  Lab 03/27/20 1155 03/27/20 1155 03/27/20 1757 03/27/20 1757 03/29/20 0019 03/30/20 0024  NA 140  --  139  --  138 139  K 4.4   < > 4.0   < > 3.8 3.8  CL 102  --  103  --  104 106  CO2 26  --  24  --  24 24  GLUCOSE 83  --  92  --  139* 143*  BUN 17  --  15  --  24* 20  CREATININE 1.47*  --  1.30*  --  1.74* 1.29*  CALCIUM 9.5  --  8.7*  --  8.6* 8.7*   < > = values in this interval not displayed.   GFR Estimated Creatinine Clearance: 50.2 mL/min (A) (by C-G formula based on SCr of 1.29 mg/dL (H)). Liver Function Tests: Recent Labs  Lab 03/27/20 1155 03/27/20 1757  AST 61* 68*  ALT 63* 61*  ALKPHOS  --  26*  BILITOT 0.6 0.5  PROT 7.0  6.3*  ALBUMIN  --  3.9   No results for input(s): LIPASE, AMYLASE in the last 168 hours. No results for input(s): AMMONIA in the last 168 hours. Coagulation profile Recent Labs  Lab 03/27/20 1434 03/28/20 0422 03/29/20 0019 03/30/20 0024 03/31/20 0042  INR 2.5 2.6* 2.6* 3.1*  3.0*    CBC: Recent Labs  Lab 03/27/20 1155 03/28/20 0422 03/29/20 0019 03/30/20 0024 03/31/20 0042  WBC 9.0 6.2 5.6 5.6 6.4  HGB 10.4* 9.5* 9.0* 8.7* 9.8*  HCT 32.5* 30.3* 28.8* 28.0* 31.3*  MCV 93.7 97.1 97.0 96.9 97.8  PLT 264 219 214 199 228   Cardiac Enzymes: No results for input(s): CKTOTAL, CKMB, CKMBINDEX, TROPONINI in the last 168 hours. BNP: Invalid input(s): POCBNP CBG: Recent Labs  Lab 03/30/20 1151 03/30/20 1637 03/30/20 2053 03/31/20 0618 03/31/20 1123  GLUCAP 106* 96 103* 92 111*   D-Dimer No results for input(s): DDIMER in the last 72 hours. Hgb A1c No results for input(s): HGBA1C in the last 72 hours. Lipid Profile Recent Labs    03/29/20 0019  CHOL 126  HDL 51  LDLCALC 45  TRIG 148  CHOLHDL 2.5   Thyroid function studies No results for input(s): TSH, T4TOTAL, T3FREE, THYROIDAB in the last 72 hours.  Invalid input(s): FREET3 Anemia work up No results for input(s): VITAMINB12, FOLATE, FERRITIN, TIBC, IRON, RETICCTPCT in the last 72 hours. Microbiology Recent Results (from the past 240 hour(s))  Urine Culture     Status: Abnormal   Collection Time: 03/27/20 11:55 AM   Specimen: Urine  Result Value Ref Range Status   MICRO NUMBER: [id number]  Final   SPECIMEN QUALITY: Adequate  Final   Sample Source NOT GIVEN  Final   STATUS: FINAL  Final   ISOLATE 1: ESBL Escherichia coli (A)  Final    Comment: Greater than 100,000 CFU/mL of Escherichia coli (ESBL) ESBL RESULT:        The organism has been confirmed as an ESBL producer.      Susceptibility   Esbl escherichia coli - URINE CULTURE, REFLEX    AMOX/CLAVULANIC 16 Intermediate     AMPICILLIN* >=32 Resistant       * Extended spectrum beta-lactamase (ESBL) producingorganisms demonstrate decreased activity withpenicillins, cephalosporins and aztreonam.    AMPICILLIN/SULBACTAM >=32 Resistant     CEFAZOLIN* >=64 Resistant      * Extended spectrum beta-lactamase (ESBL) producingorganisms demonstrate decreased activity withpenicillins, cephalosporins and aztreonam.For uncomplicated UTI caused by E. coli,K. pneumoniae or P. mirabilis: Cefazolin issusceptible if MIC <32 mcg/mL and predictssusceptible to the oral agents cefaclor, cefdinir,cefpodoxime, cefprozil, cefuroxime, cephalexinand loracarbef.    CEFEPIME >=64 Resistant     CEFTRIAXONE >=64 Resistant     CIPROFLOXACIN >=4 Resistant     LEVOFLOXACIN >=8 Resistant     ERTAPENEM <=0.5 Sensitive     IMIPENEM <=0.25 Sensitive     NITROFURANTOIN <=16 Sensitive     PIP/TAZO 8 Sensitive     TRIMETH/SULFA* >=320 Resistant      * Extended spectrum beta-lactamase (ESBL) producingorganisms demonstrate decreased activity withpenicillins, cephalosporins and aztreonam.For uncomplicated UTI caused by E. coli,K. pneumoniae or P. mirabilis: Cefazolin issusceptible if MIC <32 mcg/mL and predictssusceptible to the oral agents cefaclor, cefdinir,cefpodoxime, cefprozil, cefuroxime, cephalexinand loracarbef.Legend:S = Susceptible  I = IntermediateR = Resistant  NS = Not susceptible* = Not tested  NR = Not reported**NN = See antimicrobic comments  Respiratory Panel by RT PCR (Flu A&B, Covid) - Nasopharyngeal Swab     Status: None   Collection Time: 03/27/20  7:35 PM   Specimen: Nasopharyngeal Swab  Result Value Ref Range Status   SARS Coronavirus 2 by RT PCR NEGATIVE NEGATIVE Final    Comment: (NOTE) SARS-CoV-2 target nucleic acids are NOT DETECTED.  The SARS-CoV-2 RNA is generally detectable in upper respiratoy specimens during the  acute phase of infection. The lowest concentration of SARS-CoV-2 viral copies this assay can detect is 131 copies/mL. A negative result does not  preclude SARS-Cov-2 infection and should not be used as the sole basis for treatment or other patient management decisions. A negative result may occur with  improper specimen collection/handling, submission of specimen other than nasopharyngeal swab, presence of viral mutation(s) within the areas targeted by this assay, and inadequate number of viral copies (<131 copies/mL). A negative result must be combined with clinical observations, patient history, and epidemiological information. The expected result is Negative.  Fact Sheet for Patients:  PinkCheek.be  Fact Sheet for Healthcare Providers:  GravelBags.it  This test is no t yet approved or cleared by the [place] FDA and  has been authorized for detection and/or diagnosis of SARS-CoV-2 by FDA under an Emergency Use Authorization (EUA). This EUA will remain  in effect (meaning this test can be used) for the duration of the COVID-19 declaration under Section 564(b)(1) of the Act, 21 U.S.C. section 360bbb-3(b)(1), unless the authorization is terminated or revoked sooner.     Influenza A by PCR NEGATIVE NEGATIVE Final   Influenza B by PCR NEGATIVE NEGATIVE Final    Comment: (NOTE) The Xpert Xpress SARS-CoV-2/FLU/RSV assay is intended as an aid in  the diagnosis of influenza from Nasopharyngeal swab specimens and  should not be used as a sole basis for treatment. Nasal washings and  aspirates are unacceptable for Xpert Xpress SARS-CoV-2/FLU/RSV  testing.  Fact Sheet for Patients: PinkCheek.be  Fact Sheet for Healthcare Providers: GravelBags.it  This test is not yet approved or cleared by the [place] FDA and  has been authorized for detection and/or diagnosis of SARS-CoV-2 by  FDA under an Emergency Use Authorization (EUA). This EUA will remain  in effect (meaning this test can be used) for the  duration of the  Covid-19 declaration under Section 564(b)(1) of the Act, 21  U.S.C. section 360bbb-3(b)(1), unless the authorization is  terminated or revoked. Performed at [facility], [place] [address], [place], [place] [address]   MRSA PCR Screening     Status: None   Collection Time: 03/28/20  2:22 AM   Specimen: Nasal Mucosa; Nasopharyngeal  Result Value Ref Range Status   MRSA by PCR NEGATIVE NEGATIVE Final    Comment:        The GeneXpert MRSA Assay (FDA approved for NASAL specimens only), is one component of a comprehensive MRSA colonization surveillance program. It is not intended to diagnose MRSA infection nor to guide or monitor treatment for MRSA infections. Performed at [facility], [place] [address], [place], [place] [address]   Culture, Urine     Status: Abnormal   Collection Time: 03/28/20  1:15 PM   Specimen: Urine, Clean Catch  Result Value Ref Range Status   Specimen Description URINE, CLEAN CATCH  Final   Special Requests NONE  Final   Culture (A)  Final    <10,000 COLONIES/mL INSIGNIFICANT GROWTH Performed at [facility], [place] [address], [place], [place] [address]    Report Status 03/29/2020 FINAL  Final     Discharge Instructions:   Discharge Instructions    Diet - low sodium heart healthy   Complete by: As directed    Diet Carb Modified   Complete by: As directed    Discharge instructions   Complete by: As directed    Recommendations: 1. Close follow up with your psychiatrist/geriatric psychiatrist for medication management  2.  Outpatient neurology follow up for old CVA findings 3. Frequent visits with PCP 4. At night would use a bedside commode to avoid falls Home health CBC 1 week   Increase activity slowly   Complete by: As directed      Allergies as of 03/31/2020      Reactions   Mucinex [guaifenesin Er] Other (See Comments)   Severe headaches   Keflex [cephalexin] Other (See Comments)   Headaches and dizziness    Sulfa Antibiotics Other (See Comments)   headaches   Sulfonamide Derivatives Other (See Comments)   Headaches      Medication List    STOP taking these medications   amLODipine 5 MG tablet Commonly known as: NORVASC     TAKE these medications   acetaminophen 650 MG CR tablet Commonly known as: TYLENOL Take 1,300 mg by mouth every 8 (eight) hours as needed for pain.   amiodarone 200 MG tablet Commonly known as: PACERONE TAKE 1 TABLET BY MOUTH TWICE A DAY   aspirin EC 81 MG tablet Take 81 mg by mouth daily.   butalbital-acetaminophen-caffeine 50-325-40 MG tablet Commonly known as: FIORICET Take 1 tablet by mouth as needed for migraine.   cyanocobalamin 1000 MCG tablet Take 1 tablet (1,000 mcg total) by mouth daily. Start taking on: April 01, 2020   fenofibrate micronized 130 MG capsule Commonly known as: ANTARA Take 1 capsule (130 mg total) by mouth daily before breakfast.   furosemide 40 MG tablet Commonly known as: LASIX Take 1 tablet (40 mg total) by mouth daily as needed for edema. What changed:   when to take this  reasons to take this   glucose blood test strip Commonly known as: OneTouch Verio Check blood sugars once daily Dx: E11.9   lamoTRIgine 100 MG tablet Commonly known as: LAMICTAL Take 100 mg by mouth in the morning and 200 mg by mouth at bedtime What changed:   how much to take  how to take this  when to take this   lidocaine 5 % Commonly known as: LIDODERM Place 1 patch onto the skin daily. Remove & Discard patch within 12 hours or as directed by MD   metFORMIN 500 MG 24 hr tablet Commonly known as: GLUCOPHAGE-XR Take 500 mg by mouth 2 (two) times daily.   metoprolol succinate 25 MG 24 hr tablet Commonly known as: Toprol XL Take 0.5 tablets (12.5 mg total) by mouth daily. What changed: how much to take   omeprazole 20 MG tablet Commonly known as: PRILOSEC OTC Take 20 mg by mouth in the morning and at bedtime.   ONE TOUCH  LANCETS Misc Test once daily to check blood sugar. DX E11.9   onetouch ultrasoft lancets Use as instructed to test blood sugars once daily Dx. E11.65   OXcarbazepine 150 MG tablet Commonly known as: TRILEPTAL Take 1 tablet (150 mg total) by mouth 2 (two) times daily.   pantoprazole 40 MG tablet Commonly known as: PROTONIX Take 1 tablet (40 mg total) by mouth daily.   QUEtiapine 300 MG 24 hr tablet Commonly known as: SEROQUEL XR Take 2 tablets (600 mg total) by mouth every evening. What changed: Another medication with the same name was removed. Continue taking this medication, and follow the directions you see here.   rOPINIRole 0.25 MG tablet Commonly known as: REQUIP Take 1-2 tablets (0.25-0.5 mg total) by mouth at bedtime. What changed: how much to take   rosuvastatin 40 MG tablet Commonly known as: CRESTOR  Take 1 tablet (40 mg total) by mouth daily.   SUMAtriptan 50 MG tablet Commonly known as: IMITREX TAKE ONE TABLET EVERY 2 HOURS AS NEEDED FOR MIGRAINE. MAY REPEAT IN 2 HOURS IF HEADACHE PERSISTS OR RECURS. (MAX OF 2 TABLETS IN 24 HOURS) What changed: See the new instructions.   venlafaxine XR 75 MG 24 hr capsule Commonly known as: Effexor XR Take 1 capsule (75 mg total) by mouth daily with breakfast.   warfarin 5 MG tablet Commonly known as: COUMADIN Take as directed. If you are unsure how to take this medication, talk to your nurse or doctor. Original instructions: Take as directed by [facility]. What changed:   how much to take  how to take this  when to take this  additional instructions       Follow-up Information    [facility].   Why: HHPT,[facility]OT [place], [place] Follow up in 1 week(s).   Specialty: Family Medicine Contact information: [place] [address] [phone]                Time coordinating discharge: 35 min  Signed:  Geradine Girt DO  [facility] 03/31/2020, 1:50 PM

## 2020-04-02 ENCOUNTER — Telehealth: Payer: Self-pay

## 2020-04-02 NOTE — Telephone Encounter (Signed)
Transition Care Management Follow-up Telephone Call  Date of discharge and from where: 03/31/20-[facility]  How have you been since you were released from the hospital? Doing good  Any questions or concerns? No  Items Reviewed:  Did the pt receive and understand the discharge instructions provided? Yes   Medications obtained and verified? Yes   Other? Yes   Any new allergies since your discharge? No   Dietary orders reviewed? Yes  Do you have support at home? Yes   Home Care and Equipment/Supplies: Were home health services ordered? yes If so, what is the name of the agency? [facility]  Has the agency set up a time to come to the patient's home? yes Were any new equipment or medical supplies ordered?  No What is the name of the medical supply agency? n.a Were you able to get the supplies/equipment? not applicable Do you have any questions related to the use of the equipment or supplies? No  Functional Questionnaire: (I = Independent and D = Dependent) ADLs: I  Bathing/Dressing- I  Meal Prep- I-with assistance  Eating- I  Maintaining continence- I  Transferring/Ambulation- I-with walker  Managing Meds- I  Follow up appointments reviewed:   PCP Hospital f/u appt confirmed? Yes  Scheduled to see Mackie Pai on 04/04/20 @ 2:40pm.  [facility] f/u appt confirmed? No  Patient states her daughter to call & schedule  Are transportation arrangements needed? No   If their condition worsens, is the pt aware to call PCP or go to the Emergency Dept.? Yes  Was the patient provided with contact information for the PCP's office or ED? Yes  Was to pt encouraged to call back with questions or concerns? Yes

## 2020-04-04 ENCOUNTER — Telehealth: Payer: Self-pay | Admitting: Medical

## 2020-04-04 ENCOUNTER — Telehealth: Payer: Self-pay | Admitting: *Deleted

## 2020-04-04 ENCOUNTER — Other Ambulatory Visit: Payer: Self-pay

## 2020-04-04 ENCOUNTER — Telehealth: Payer: Self-pay

## 2020-04-04 ENCOUNTER — Ambulatory Visit (INDEPENDENT_AMBULATORY_CARE_PROVIDER_SITE_OTHER): Payer: PPO | Admitting: Medical

## 2020-04-04 VITALS — BP 130/49 | HR 68 | Resp 18 | Ht 68.0 in | Wt 211.0 lb

## 2020-04-04 DIAGNOSIS — E113593 Type 2 diabetes mellitus with proliferative diabetic retinopathy without macular edema, bilateral: Secondary | ICD-10-CM | POA: Diagnosis not present

## 2020-04-04 DIAGNOSIS — E1169 Type 2 diabetes mellitus with other specified complication: Secondary | ICD-10-CM

## 2020-04-04 DIAGNOSIS — D699 Hemorrhagic condition, unspecified: Secondary | ICD-10-CM

## 2020-04-04 DIAGNOSIS — I4891 Unspecified atrial fibrillation: Secondary | ICD-10-CM | POA: Diagnosis not present

## 2020-04-04 DIAGNOSIS — R531 Weakness: Secondary | ICD-10-CM

## 2020-04-04 DIAGNOSIS — N1831 Chronic kidney disease, stage 3a: Secondary | ICD-10-CM | POA: Diagnosis not present

## 2020-04-04 DIAGNOSIS — R413 Other amnesia: Secondary | ICD-10-CM

## 2020-04-04 DIAGNOSIS — F319 Bipolar disorder, unspecified: Secondary | ICD-10-CM

## 2020-04-04 DIAGNOSIS — D631 Anemia in chronic kidney disease: Secondary | ICD-10-CM

## 2020-04-04 DIAGNOSIS — R2689 Other abnormalities of gait and mobility: Secondary | ICD-10-CM

## 2020-04-04 DIAGNOSIS — R4182 Altered mental status, unspecified: Secondary | ICD-10-CM

## 2020-04-04 LAB — POCT INR: INR: 2.7 (ref 2.0–3.0)

## 2020-04-04 NOTE — Progress Notes (Addendum)
Subjective:    Patient ID: [name], female    DOB: [birth year], 69 y.o.   MRN: [id number]  HPI  Pt in for follow up from hospitalization.  Admission note reads.   Assessment/Plan Principal Problem:   UTI (urinary tract infection) Active Problems:   Anemia   Atrial fibrillation (HCC)   Essential hypertension, benign   Recurrent falls   Suspected UTI UA with positive nitrite, moderate amount of leukocytes, 21-50 WBCs, and rare bacteria.  No fever, leukocytosis, or signs of sepsis. -Ceftriaxone.  Order urine culture.  Left-sided weakness and word finding difficulty Recurrent falls Head CT negative for acute intracranial abnormality.  Neuro exam abnormal with slight left upper and lower extremity weakness, slight left pronator drift, and deviation of the tongue to the right. -Brain MRI ordered but cannot be done until the morning when the pacemaker technician is available.  Neurology has been consulted. PT/OT eval, fall precautions.  Acute metabolic encephalopathy Likely due to UTI versus possible stroke.  Head CT negative for acute intracranial abnormality, brain MRI pending.  Currently AAO x3 and answering questions appropriately. -Continue management as mentioned above  Normocytic anemia Hemoglobin 10.4, MCV 93.7.  On previous labs, hemoglobin was in the 12-13 range.  Patient is not endorsing any symptoms of acute GI bleed.  Does have a large hematoma in her left hip region. -Anemia panel and FOBT.  Monitor H&H.  History of aortic insufficiency status post mechanical valve replacement INR checked at PCPs office today was 2.5. -Holding Coumadin overnight as she has a large hematoma secondary to fall in her left hip region.  Hemoglobin has dropped from baseline.  Repeat H&H in the morning.  Atrial fibrillation/flutter Stable.  Has a pacemaker. -Continue amiodarone.  Holding Coumadin as mentioned above due to large left hip hematoma.  Mildly elevated  transaminases AST 68 and ALT 61.  No elevation of alk phos or T bili.  No complaints of abdominal pain, nausea, vomiting. -Right upper quadrant ultrasound  Chronic combined systolic and diastolic CHF No signs of volume overload. -Hold Lasix time.  Non-insulin-dependent type 2 diabetes -Check A1c.  Sliding scale insulin sensitive ACHS and CBG checks.  Hypertension -Brain MRI pending to rule out stroke, allow permissive hypertension at this time.  Hyperlipidemia -Continue fenofibrate, Crestor  GERD -Continue PPI and H2 blocker  Depression, bipolar disorder -Continue home medications  DVT prophylaxis: Coumadin Code Status: Full code-discussed with the patient. Family Communication: No family at bedside. Disposition Plan: Status is: Observation  The patient remains OBS appropriate and will d/c before 2 midnights.  Dispo: The patient is from: Home  Anticipated d/c is to: Home  Anticipated d/c date is: 2 days  Patient currently is not medically stable to d/c.   Reviewed pt mri's and her urine culture.     Review of Systems  Constitutional: Negative for chills, fatigue and fever.  Respiratory: Negative for cough, chest tightness, shortness of breath and wheezing.   Cardiovascular: Negative for chest pain and palpitations.  Gastrointestinal: Negative for abdominal pain, blood in stool, constipation, nausea and vomiting.  Genitourinary: Negative for decreased urine volume, difficulty urinating, dysuria, vaginal bleeding and vaginal discharge.  Musculoskeletal: Negative for gait problem, neck pain and neck stiffness.  Skin: Negative for rash.  Neurological: Negative for dizziness, syncope, weakness, numbness and headaches.       When walking off balance.  Some forgetful per daughter.  Hematological: Negative for adenopathy. Does not bruise/bleed easily.  Psychiatric/Behavioral: Negative for behavioral problems and  decreased  concentration. The patient is not nervous/anxious.        Appears stable.    Past Medical History:  Diagnosis Date  . ANEMIA 05/28/2010  . AORTIC VALVE REPLACEMENT, HX OF 03/11/2010   Mechanical prosthesis  . BIPOLAR AFFECTIVE DISORDER 03/11/2010  . Depression   . Diverticulitis 12/18/2010  . FIBROIDS, UTERUS 03/11/2010  . Gout 09/04/2016  . Grave's disease 8-12  . Hyperlipidemia associated with type 2 diabetes mellitus ([place]) 06/05/2016  . Hypertension   . Low back pain 11/02/2017  . Migraine 06/05/2016  . Mixed hyperlipidemia 03/11/2010  . Obesity   . Skin cancer   . Syncope 08/22/2019  . UTI (lower urinary tract infection) 08/14/2011     Social History   Socioeconomic History  . Marital status: Married    Spouse name: Not on file  . Number of children: Not on file  . Years of education: Not on file  . Highest education level: Not on file  Occupational History  . Not on file  Tobacco Use  . Smoking status: Former Smoker    Quit date: 05/26/1990    Years since quitting: 29.8  . Smokeless tobacco: Never Used  Vaping Use  . Vaping Use: Never used  Substance and Sexual Activity  . Alcohol use: Yes    Alcohol/week: 0.0 standard drinks    Comment: rare  . Drug use: No  . Sexual activity: Not Currently  Other Topics Concern  . Not on file  Social History Narrative  . Not on file   Social Determinants of Health   Financial Resource Strain:   . Difficulty of Paying Living Expenses: Not on file  Food Insecurity:   . Worried About Charity fundraiser in the Last Year: Not on file  . Ran Out of Food in the Last Year: Not on file  Transportation Needs:   . Lack of Transportation (Medical): Not on file  . Lack of Transportation (Non-Medical): Not on file  Physical Activity:   . Days of Exercise per Week: Not on file  . Minutes of Exercise per Session: Not on file  Stress:   . Feeling of Stress : Not on file  Social Connections:   . Frequency of Communication with  Friends and Family: Not on file  . Frequency of Social Gatherings with Friends and Family: Not on file  . Attends Religious Services: Not on file  . Active Member of Clubs or Organizations: Not on file  . Attends Archivist Meetings: Not on file  . Marital Status: Not on file  Intimate Partner Violence:   . Fear of Current or Ex-Partner: Not on file  . Emotionally Abused: Not on file  . Physically Abused: Not on file  . Sexually Abused: Not on file    Past Surgical History:  Procedure Laterality Date  . ABDOMINAL HYSTERECTOMY  ?1998   sb/l spo, total for fibroids/heavy bleeding  . ANKLE SURGERY Left 01/2018   hardware removal  . AORTIC VALVE REPLACEMENT  2011   Mechanical prosthesis, [employer]  . APPENDECTOMY     Required revision for EColi infection, required recurrent packing  . bowel obstruction     Requiring adhesions to be removed  . CHOLECYSTECTOMY    . COLONOSCOPY WITH PROPOFOL N/A 11/01/2015   Procedure: COLONOSCOPY WITH PROPOFOL;  Surgeon: Milus Banister, MD;  Location: [facility];  Service: Endoscopy;  Laterality: N/A;  . CYSTOCELE REPAIR N/A 10/16/2015   Procedure: ANTERIOR REPAIR (CYSTOCELE);  Surgeon: Donnamae Jude, MD;  Location: [facility];  Service: Gynecology;  Laterality: N/A;  . HERNIA REPAIR  08-16-10  . LEFT HEART CATHETERIZATION WITH CORONARY ANGIOGRAM N/A 12/05/2011   Procedure: LEFT HEART CATHETERIZATION WITH CORONARY ANGIOGRAM;  Surgeon: Sinclair Grooms, MD;  Location: [facility] CATH LAB;  Service: Cardiovascular;  Laterality: N/A;  . PACEMAKER IMPLANT N/A 01/09/2020   Procedure: PACEMAKER IMPLANT;  Surgeon: Evans Lance, MD;  Location: [facility];  Service: Cardiovascular;  Laterality: N/A;  . TOTAL KNEE ARTHROPLASTY Right 02/24/2013   Procedure: RIGHT TOTAL KNEE ARTHROPLASTY;  Surgeon: Johnn Hai, MD;  Location: [facility];  Service: Orthopedics;  Laterality: Right;  . TUBAL LIGATION    . varicose vein surgery b/l legs      Family History   Problem Relation Age of Onset  . Scoliosis Mother   . Arthritis Mother        Rheumatoid  . Aneurysm Mother        heart  . Alcohol abuse Father   . Depression Sister   . Depression Brother   . Alcohol abuse Brother   . Cancer Maternal Grandmother        colon  . Diabetes Maternal Grandfather   . Heart disease Maternal Grandfather   . Alcohol abuse Maternal Grandfather   . Depression Paternal Grandmother   . Diabetes Paternal Grandmother   . Depression Paternal Grandfather   . Diabetes Paternal Grandfather   . Depression Sister   . Alcohol abuse Brother   . Depression Brother   . Heart disease Brother     Allergies  Allergen Reactions  . Mucinex [Guaifenesin Er] Other (See Comments)    Severe headaches   . Keflex [Cephalexin] Other (See Comments)    Headaches and dizziness  . Sulfa Antibiotics Other (See Comments)    headaches  . Sulfonamide Derivatives Other (See Comments)    Headaches     Current Outpatient Medications on File Prior to Visit  Medication Sig Dispense Refill  . acetaminophen (TYLENOL) 650 MG CR tablet Take 1,300 mg by mouth every 8 (eight) hours as needed for pain.    Marland Kitchen amiodarone (PACERONE) 200 MG tablet TAKE 1 TABLET BY MOUTH TWICE A DAY (Patient taking differently: Take 200 mg by mouth 2 (two) times daily. ) 180 tablet 3  . aspirin EC 81 MG tablet Take 81 mg by mouth daily.    . butalbital-acetaminophen-caffeine (FIORICET) 50-325-40 MG tablet Take 1 tablet by mouth as needed for migraine.    . fenofibrate micronized (ANTARA) 130 MG capsule Take 1 capsule (130 mg total) by mouth daily before breakfast. 30 capsule 3  . furosemide (LASIX) 40 MG tablet Take 1 tablet (40 mg total) by mouth daily as needed for edema. 90 tablet 3  . glucose blood (ONETOUCH VERIO) test strip Check blood sugars once daily Dx: E11.9 100 each 12  . lamoTRIgine (LAMICTAL) 100 MG tablet Take 100 mg by mouth in the morning and 200 mg by mouth at bedtime (Patient taking  differently: Take 100-200 mg by mouth See admin instructions. Take 100 mg by mouth in the morning and 200 mg by mouth at bedtime) 270 tablet 1  . Lancets (ONETOUCH ULTRASOFT) lancets Use as instructed to test blood sugars once daily Dx. E11.65 100 each 12  . lidocaine (LIDODERM) 5 % Place 1 patch onto the skin daily. Remove & Discard patch within 12 hours or as directed by MD 30 patch 0  . metoprolol  succinate (TOPROL XL) 25 MG 24 hr tablet Take 0.5 tablets (12.5 mg total) by mouth daily. 90 tablet 1  . ONE TOUCH LANCETS MISC Test once daily to check blood sugar. DX E11.9 100 each 3  . SUMAtriptan (IMITREX) 50 MG tablet TAKE ONE TABLET EVERY 2 HOURS AS NEEDED FOR MIGRAINE. MAY REPEAT IN 2 HOURS IF HEADACHE PERSISTS OR RECURS. (MAX OF 2 TABLETS IN 24 HOURS) (Patient taking differently: Take 50 mg by mouth every 2 (two) hours as needed for migraine. ) 10 tablet 0  . vitamin B-12 1000 MCG tablet Take 1 tablet (1,000 mcg total) by mouth daily. 30 tablet 0  . warfarin (COUMADIN) 5 MG tablet Take as directed by Coumadin Clinic. (Patient taking differently: Take 2.5-5 mg by mouth See admin instructions. Take 2.5mg  on Mondays and Fridays, and 5mg  on all other days.Take every night.) 110 tablet 1  . metFORMIN (GLUCOPHAGE-XR) 500 MG 24 hr tablet Take 500 mg by mouth 2 (two) times daily. (Patient not taking: Reported on 04/04/2020)    . omeprazole (PRILOSEC OTC) 20 MG tablet Take 20 mg by mouth in the morning and at bedtime. (Patient not taking: Reported on 04/04/2020)    . OXcarbazepine (TRILEPTAL) 150 MG tablet Take 1 tablet (150 mg total) by mouth 2 (two) times daily. 180 tablet 1  . pantoprazole (PROTONIX) 40 MG tablet Take 1 tablet (40 mg total) by mouth daily. (Patient not taking: Reported on 04/04/2020) 90 tablet 3  . QUEtiapine (SEROQUEL XR) 300 MG 24 hr tablet Take 2 tablets (600 mg total) by mouth every evening. 180 tablet 1  . rOPINIRole (REQUIP) 0.25 MG tablet Take 1-2 tablets (0.25-0.5 mg total) by  mouth at bedtime. (Patient not taking: Reported on 04/04/2020) 180 tablet 0  . rosuvastatin (CRESTOR) 40 MG tablet Take 1 tablet (40 mg total) by mouth daily. (Patient not taking: Reported on 04/04/2020) 90 tablet 1  . venlafaxine XR (EFFEXOR XR) 75 MG 24 hr capsule Take 1 capsule (75 mg total) by mouth daily with breakfast. 90 capsule 1  . [DISCONTINUED] methimazole (TAPAZOLE) 5 MG tablet Take 1 tablet (5 mg total) by mouth 3 (three) times daily. 30 tablet 2   No current facility-administered medications on file prior to visit.    BP (!) 130/49   Pulse 68   Resp 18   Ht 5\' 8"  (1.727 m)   Wt 211 lb (95.7 kg)   SpO2 98%   BMI 32.08 kg/m       Objective:   Physical Exam  General Mental Status- Alert. General Appearance- Not in acute distress.   Skin General: Color- Normal Color. Moisture- Normal Moisture.  Neck Carotid Arteries- Normal color. Moisture- Normal Moisture. No carotid bruits. No JVD.  Chest and Lung Exam Auscultation: Breath Sounds:-Normal.  Cardiovascular Auscultation:Rythm- Regular. Murmurs & Other Heart Sounds:Auscultation of the heart reveals- No Murmurs.  Abdomen Inspection:-Inspeection Normal. Palpation/Percussion:Note:No mass. Palpation and Percussion of the abdomen reveal- Non Tender, Non Distended + BS, no rebound or guarding.    Neurologic Cranial Nerve exam:- CN III-XII intact(No nystagmus). Strength:- 5/5 equal and symmetric strength both upper and lower extremities.      Assessment & Plan:  For follow-up posthospitalization, placed referral to neurologist as recommended by discharging physician.  We will get opinion on recent MRI findings.  See if any contributing factor to poor balance/dizziness.  Particularly if vertebral artery stenosis played a role.  Good strength left side today.  For poor balance continue with PT.  INR  today was 2.7. continue current regimen. Can repeat here on monthly basis or get scheduled with coumadin  clinic.  History of atrial fibrillation.  Continue Coumadin.  Recommend continue metoprolol.  For memory changes and possible polypharmacy did go ahead and place neuropsychologist referral.  Also attend upcoming psychiatrist appointment.  You might get some input from NP as to who she would recommend for cognitive testing.  For diabetes, can discontinue Metformin.  Your A1c was 5.3.  Eat low sugar diet.  For anemia, will get CBC, B12 and iron level.  For recent acute kidney injury, recheck metabolic panel.  For reflux you discontinued your PPIs.  Would use as needed Pepcid as needed.  History of hyperlipidemia and family decided to stop statin and fenofibrate.  Recommend getting future lipid panel in 6 to 8 weeks.  Follow-up in 3 weeks with PCP or sooner if needed.  Give more specific instruction on follow-up pending lab review.  Time spent with patient today was 60  minutes which consisted of chart revdiew, discussing diagnosis, work up treatment and documentation.  Note extra time spent explaining benefit of statin and fenofribrate in light of her history and recent hospitalization with neuro symptoms. Also answered questions on reasons for referrals.

## 2020-04-04 NOTE — Telephone Encounter (Signed)
Future lipid panel placed.

## 2020-04-04 NOTE — Telephone Encounter (Signed)
Yes it is OK to give VO requested

## 2020-04-04 NOTE — Telephone Encounter (Signed)
Called pt since she missed today's appt; spoke with the daughter and she stated that they went to the PCP today and she had been in the hospital. Her dtr states she did not have her calendar with her so she will call back for an appt.

## 2020-04-04 NOTE — Telephone Encounter (Signed)
Stacey w/[facility] called and stated PT resumption of care was done on Sunday.  Need VO for three week one, two week two. CB # for [name] is [phone], please leave VM if she does not answer.

## 2020-04-04 NOTE — Patient Instructions (Addendum)
For follow-up posthospitalization, placed referral to neurologist as recommended by discharging physician.  We will get opinion on recent MRI findings.  See if any contributing factor to poor balance/dizziness.  Particularly if vertebral artery stenosis.  Good strength left side today.  For poor balance continue with PT.  INR today was 2.7. continue current regimen. Can repeat here on monthly basis or get scheduled with coumadin clinic.  History of atrial fibrillation.  Continue Coumadin.  Recommend continue metoprolol.  For memory changes and possible polypharmacy did go ahead and place neuropsychologist referral.  Also attend upcoming psychiatrist appointment.  You might get some input from NP as to who she would recommend for cognitive testing.  For diabetes, can discontinue Metformin.  Your A1c was 5.3.  Eat low sugar diet.  For anemia, will get CBC, B12 and iron level.  For recent acute kidney injury, recheck metabolic panel.  For reflux you discontinued your PPIs.  Would use as needed Pepcid as needed.  History of hyperlipidemia and family decided to stop statin and fenofibrate.  Recommend getting future lipid panel in 6 to 8 weeks.  Follow-up in 3 weeks with PCP or sooner if needed.  Give more specific instruction on follow-up pending lab review.

## 2020-04-05 LAB — CBC WITH DIFFERENTIAL/PLATELET
Basophils Absolute: 0 10*3/uL (ref 0.0–0.1)
Basophils Relative: 0.3 % (ref 0.0–3.0)
Eosinophils Absolute: 0.1 10*3/uL (ref 0.0–0.7)
Eosinophils Relative: 2.5 % (ref 0.0–5.0)
HCT: 32.1 % — ABNORMAL LOW (ref 36.0–46.0)
Hemoglobin: 10.7 g/dL — ABNORMAL LOW (ref 12.0–15.0)
Lymphocytes Relative: 18.6 % (ref 12.0–46.0)
Lymphs Abs: 1 10*3/uL (ref 0.7–4.0)
MCHC: 33.3 g/dL (ref 30.0–36.0)
MCV: 92.6 fl (ref 78.0–100.0)
Monocytes Absolute: 0.4 10*3/uL (ref 0.1–1.0)
Monocytes Relative: 8.7 % (ref 3.0–12.0)
Neutro Abs: 3.6 10*3/uL (ref 1.4–7.7)
Neutrophils Relative %: 69.9 % (ref 43.0–77.0)
Platelets: 320 10*3/uL (ref 150.0–400.0)
RBC: 3.46 Mil/uL — ABNORMAL LOW (ref 3.87–5.11)
RDW: 15.3 % (ref 11.5–15.5)
WBC: 5.1 10*3/uL (ref 4.0–10.5)

## 2020-04-05 LAB — COMPREHENSIVE METABOLIC PANEL
ALT: 61 U/L — ABNORMAL HIGH (ref 0–35)
AST: 77 U/L — ABNORMAL HIGH (ref 0–37)
Albumin: 4.2 g/dL (ref 3.5–5.2)
Alkaline Phosphatase: 36 U/L — ABNORMAL LOW (ref 39–117)
BUN: 22 mg/dL (ref 6–23)
CO2: 29 mEq/L (ref 19–32)
Calcium: 9.2 mg/dL (ref 8.4–10.5)
Chloride: 101 mEq/L (ref 96–112)
Creatinine, Ser: 1.21 mg/dL — ABNORMAL HIGH (ref 0.40–1.20)
GFR: 45.78 mL/min — ABNORMAL LOW (ref >60.00)
Glucose, Bld: 93 mg/dL (ref 70–99)
Potassium: 4.1 mEq/L (ref 3.5–5.1)
Sodium: 138 mEq/L (ref 135–145)
Total Bilirubin: 0.5 mg/dL (ref 0.2–1.2)
Total Protein: 6.7 g/dL (ref 6.0–8.3)

## 2020-04-05 LAB — IRON: Iron: 74 ug/dL (ref 42–145)

## 2020-04-05 LAB — VITAMIN B12: Vitamin B-12: 680 pg/mL (ref 211–911)

## 2020-04-05 NOTE — Telephone Encounter (Signed)
Left detailed message on voicemail.  

## 2020-04-11 ENCOUNTER — Ambulatory Visit (INDEPENDENT_AMBULATORY_CARE_PROVIDER_SITE_OTHER): Payer: PPO

## 2020-04-11 DIAGNOSIS — I495 Sick sinus syndrome: Secondary | ICD-10-CM | POA: Diagnosis not present

## 2020-04-11 LAB — CUP PACEART REMOTE DEVICE CHECK
Battery Remaining Longevity: 162 mo
Battery Remaining Percentage: 100 %
Brady Statistic RA Percent Paced: 0 %
Brady Statistic RV Percent Paced: 38 %
Date Time Interrogation Session: 20211117023100
Implantable Lead Implant Date: 20210816
Implantable Lead Implant Date: 20210816
Implantable Lead Location: 753859
Implantable Lead Location: 753860
Implantable Lead Model: 7842
Implantable Lead Model: 7842
Implantable Lead Serial Number: 1013287
Implantable Lead Serial Number: 1091261
Implantable Pulse Generator Implant Date: 20210816
Lead Channel Impedance Value: 507 Ohm
Lead Channel Impedance Value: 769 Ohm
Lead Channel Pacing Threshold Amplitude: 0.7 V
Lead Channel Pacing Threshold Pulse Width: 0.4 ms
Lead Channel Setting Pacing Amplitude: 3.5 V
Lead Channel Setting Pacing Pulse Width: 0.4 ms
Lead Channel Setting Sensing Sensitivity: 2.5 mV
Pulse Gen Serial Number: 940432

## 2020-04-12 ENCOUNTER — Telehealth: Payer: Self-pay | Admitting: Psychiatry

## 2020-04-12 ENCOUNTER — Telehealth (INDEPENDENT_AMBULATORY_CARE_PROVIDER_SITE_OTHER): Payer: PPO | Admitting: Psychiatry

## 2020-04-12 DIAGNOSIS — F99 Mental disorder, not otherwise specified: Secondary | ICD-10-CM | POA: Diagnosis not present

## 2020-04-12 DIAGNOSIS — F3181 Bipolar II disorder: Secondary | ICD-10-CM | POA: Diagnosis not present

## 2020-04-12 DIAGNOSIS — F411 Generalized anxiety disorder: Secondary | ICD-10-CM | POA: Diagnosis not present

## 2020-04-12 DIAGNOSIS — F5105 Insomnia due to other mental disorder: Secondary | ICD-10-CM | POA: Diagnosis not present

## 2020-04-12 MED ORDER — OXCARBAZEPINE 150 MG PO TABS: ORAL_TABLET | ORAL | 1 refills | Status: DC

## 2020-04-12 NOTE — Telephone Encounter (Signed)
[name], abdella are scheduled for a virtual visit with your provider today.    Just as we do with appointments in the office, we must obtain your consent to participate.  Your consent will be active for this visit and any virtual visit you may have with one of our providers in the next 365 days.    If you have a MyChart account, I can also send a copy of this consent to you electronically.  All virtual visits are billed to your insurance company just like a traditional visit in the office.  As this is a virtual visit, video technology does not allow for your provider to perform a traditional examination.  This may limit your provider's ability to fully assess your condition.  If your provider identifies any concerns that need to be evaluated in person or the need to arrange testing such as labs, EKG, etc, we will make arrangements to do so.    Although advances in technology are sophisticated, we cannot ensure that it will always work on either your end or our end.  If the connection with a video visit is poor, we may have to switch to a telephone visit.  With either a video or telephone visit, we are not always able to ensure that we have a secure connection.   I need to obtain your verbal consent now.   Are you willing to proceed with your visit today?   [name] has provided verbal consent on 04/12/2020 for a virtual visit (video or telephone).   Thayer Headings, PMHNP 04/12/2020  1:59 PM

## 2020-04-12 NOTE — Progress Notes (Signed)
[name] [id number] 12-17-1950 69 y.o.  Virtual Visit via Telephone Note / I connected with pt on 04/12/20 at  1:30 PM EST by telephone and verified that I am speaking with the correct person/ using two identifiers.   I discussed the limitations, risks, security and privacy concerns of performing an evaluation and management service by telephone and the availability of in person appointments. I also discussed with the patient that there may be a patient responsible charge related to this service. The patient expressed understanding and agreed to proceed.   I discussed the assessment and treatment plan with the patient. The patient was provided an opportunity to ask questions and all were answered. The patient agreed with the plan and demonstrated an understanding of the instructions.   The patient was advised to call back or seek an in-person evaluation if the symptoms worsen or if the condition fails to improve as anticipated.  I provided 30 minutes of non-face-to-face time during this encounter.  The patient was located at home.  The provider was located at [place].   [name], PMHNP   Subjective:   Patient ID:  [name] is a 70 y.o. (DOB [birth year]) female.  Chief Complaint:  Chief Complaint  Patient presents with  . Other    Possible medication side effects  . Follow-up    h/o mood disturbance and anxiety    HPI [name] presents for follow-up of history of mood disturbance, anxiety, and insomnia.  Her daughter also participates in televisit and provides collateral information.  They report that [name] has been hospitalized at least twice since last visit. Daughter reports that pt had an infection  Involving multiple sites with encephalopathy and was hospitalized in [facility] while visiting family. She was in SNF and returned to daughter's home in the middle of October. She then developed dehydration and a UTI and was re-hospitalized. She has been  taken off of several medications. Daughter is now managing her medications. Daughter reports that pt is forgetful at times and that memory has been improving.   She reports that her mood has been "good." Denies depressed mood. Denies impulsive or risky behavior other than not wanting to use walker or gait belt. She reports adequate sleep with Seroquel. She reports that she has lost weight and attributes this to change in diet. She reports some situational anxiety. Energy and motivation have been somewhat low. Has been participating in OT, PT, and is helping around the house. She reports adequate focus. Denies SI.    Past medication trials: Seroquel Seroquel XR Lamictal Trileptal Effexor XR Lexapro BuSpar-felt tired Trazodone   Review of Systems:  Review of Systems  Musculoskeletal: Positive for gait problem.  Neurological: Positive for dizziness.  Psychiatric/Behavioral:       Please refer to HPI   She reports that she has been falling frequently. She reports that she has been dizzy and off balance.     Medications: I have reviewed the patient's current medications.  Current Outpatient Medications  Medication Sig Dispense Refill  . amiodarone (PACERONE) 200 MG tablet TAKE 1 TABLET BY MOUTH TWICE A DAY (Patient taking differently: Take 200 mg by mouth 2 (two) times daily. ) 180 tablet 3  . aspirin EC 81 MG tablet Take 81 mg by mouth daily.    . butalbital-acetaminophen-caffeine (FIORICET) 50-325-40 MG tablet Take 1 tablet by mouth as needed for migraine.    . furosemide (LASIX) 40 MG tablet Take 1 tablet (40 mg  total) by mouth daily as needed for edema. 90 tablet 3  . lamoTRIgine (LAMICTAL) 100 MG tablet Take 100 mg by mouth in the morning and 200 mg by mouth at bedtime (Patient taking differently: Take 100-200 mg by mouth See admin instructions. Take 100 mg by mouth in the morning and 200 mg by mouth at bedtime) 270 tablet 1  . metoprolol succinate (TOPROL XL) 25 MG 24 hr tablet  Take 0.5 tablets (12.5 mg total) by mouth daily. 90 tablet 1  . vitamin B-12 1000 MCG tablet Take 1 tablet (1,000 mcg total) by mouth daily. 30 tablet 0  . warfarin (COUMADIN) 5 MG tablet Take as directed by [facility]. (Patient taking differently: Take 2.5-5 mg by mouth See admin instructions. Take 2.5mg  on Mondays and Fridays, and 5mg  on all other days.Take every night.) 110 tablet 1  . acetaminophen (TYLENOL) 650 MG CR tablet Take 1,300 mg by mouth every 8 (eight) hours as needed for pain.    Marland Kitchen glucose blood (ONETOUCH VERIO) test strip Check blood sugars once daily Dx: E11.9 100 each 12  . Lancets (ONETOUCH ULTRASOFT) lancets Use as instructed to test blood sugars once daily Dx. E11.65 100 each 12  . lidocaine (LIDODERM) 5 % Place 1 patch onto the skin daily. Remove & Discard patch within 12 hours or as directed by MD 30 patch 0  . ONE TOUCH LANCETS MISC Test once daily to check blood sugar. DX E11.9 100 each 3  . OXcarbazepine (TRILEPTAL) 150 MG tablet Decrease to 1 tablet at bedtime for 5 nights, then decrease to 1/2 tablet at bedtime for 5 nights, then stop. 180 tablet 1  . QUEtiapine (SEROQUEL XR) 300 MG 24 hr tablet Take 2 tablets (600 mg total) by mouth every evening. 180 tablet 1  . SUMAtriptan (IMITREX) 50 MG tablet TAKE ONE TABLET EVERY 2 HOURS AS NEEDED FOR MIGRAINE. MAY REPEAT IN 2 HOURS IF HEADACHE PERSISTS OR RECURS. (MAX OF 2 TABLETS IN 24 HOURS) (Patient taking differently: Take 50 mg by mouth every 2 (two) hours as needed for migraine. ) 10 tablet 0  . venlafaxine XR (EFFEXOR XR) 75 MG 24 hr capsule Take 1 capsule (75 mg total) by mouth daily with breakfast. 90 capsule 1   No current facility-administered medications for this visit.    Medication Side Effects: Sedation and Other: ?Dizziness  Allergies:  Allergies  Allergen Reactions  . Mucinex [Guaifenesin Er] Other (See Comments)    Severe headaches   . Keflex [Cephalexin] Other (See Comments)    Headaches and  dizziness  . Sulfa Antibiotics Other (See Comments)    headaches  . Sulfonamide Derivatives Other (See Comments)    Headaches     Past Medical History:  Diagnosis Date  . ANEMIA 05/28/2010  . AORTIC VALVE REPLACEMENT, HX OF 03/11/2010   Mechanical prosthesis  . BIPOLAR AFFECTIVE DISORDER 03/11/2010  . Depression   . Diverticulitis 12/18/2010  . FIBROIDS, UTERUS 03/11/2010  . Gout 09/04/2016  . Grave's disease 8-12  . Hyperlipidemia associated with type 2 diabetes mellitus ([place]) 06/05/2016  . Hypertension   . Low back pain 11/02/2017  . Migraine 06/05/2016  . Mixed hyperlipidemia 03/11/2010  . Obesity   . Skin cancer   . Syncope 08/22/2019  . UTI (lower urinary tract infection) 08/14/2011    Family History  Problem Relation Age of Onset  . Scoliosis Mother   . Arthritis Mother        Rheumatoid  . Aneurysm Mother  heart  . Alcohol abuse Father   . Depression Sister   . Depression Brother   . Alcohol abuse Brother   . Cancer Maternal Grandmother        colon  . Diabetes Maternal Grandfather   . Heart disease Maternal Grandfather   . Alcohol abuse Maternal Grandfather   . Depression Paternal Grandmother   . Diabetes Paternal Grandmother   . Depression Paternal Grandfather   . Diabetes Paternal Grandfather   . Depression Sister   . Alcohol abuse Brother   . Depression Brother   . Heart disease Brother     Social History   Socioeconomic History  . Marital status: Married    Spouse name: Not on file  . Number of children: Not on file  . Years of education: Not on file  . Highest education level: Not on file  Occupational History  . Not on file  Tobacco Use  . Smoking status: Former Smoker    Quit date: 05/26/1990    Years since quitting: 29.9  . Smokeless tobacco: Never Used  Vaping Use  . Vaping Use: Never used  Substance and Sexual Activity  . Alcohol use: Yes    Alcohol/week: 0.0 standard drinks    Comment: rare  . Drug use: No  . Sexual  activity: Not Currently  Other Topics Concern  . Not on file  Social History Narrative  . Not on file   Social Determinants of Health   Financial Resource Strain:   . Difficulty of Paying Living Expenses: Not on file  Food Insecurity:   . Worried About Charity fundraiser in the Last Year: Not on file  . Ran Out of Food in the Last Year: Not on file  Transportation Needs:   . Lack of Transportation (Medical): Not on file  . Lack of Transportation (Non-Medical): Not on file  Physical Activity:   . Days of Exercise per Week: Not on file  . Minutes of Exercise per Session: Not on file  Stress:   . Feeling of Stress : Not on file  Social Connections:   . Frequency of Communication with Friends and Family: Not on file  . Frequency of Social Gatherings with Friends and Family: Not on file  . Attends Religious Services: Not on file  . Active Member of Clubs or Organizations: Not on file  . Attends Archivist Meetings: Not on file  . Marital Status: Not on file  Intimate Partner Violence:   . Fear of Current or Ex-Partner: Not on file  . Emotionally Abused: Not on file  . Physically Abused: Not on file  . Sexually Abused: Not on file    Past Medical History, Surgical history, Social history, and Family history were reviewed and updated as appropriate.   Please see review of systems for further details on the patient's review from today.   Objective:   Physical Exam:  There were no vitals taken for this visit.  Physical Exam Neurological:     Mental Status: She is alert and oriented to person, place, and time.     Cranial Nerves: No dysarthria.  Psychiatric:        Attention and Perception: Attention and perception normal.        Mood and Affect: Mood normal.        Speech: Speech normal.        Behavior: Behavior is cooperative.        Thought Content: Thought content normal. Thought content  is not paranoid or delusional. Thought content does not include  homicidal or suicidal ideation. Thought content does not include homicidal or suicidal plan.        Cognition and Memory: She exhibits impaired recent memory.        Judgment: Judgment normal.     Comments: Insight fair Pt periodically repeats herself. Pt also exhibits difficulty recalling details from recent events. Pt has difficulty silencing the ringer on her phone while daughter is giving her instructions on how to silence ringer.     Lab Review:     Component Value Date/Time   NA 138 04/04/2020 1551   NA 136 12/28/2019 1148   K 4.1 04/04/2020 1551   CL 101 04/04/2020 1551   CO2 29 04/04/2020 1551   GLUCOSE 93 04/04/2020 1551   BUN 22 04/04/2020 1551   BUN 20 12/28/2019 1148   CREATININE 1.21 (H) 04/04/2020 1551   CREATININE 1.47 (H) 03/27/2020 1155   CALCIUM 9.2 04/04/2020 1551   PROT 6.7 04/04/2020 1551   ALBUMIN 4.2 04/04/2020 1551   AST 77 (H) 04/04/2020 1551   ALT 61 (H) 04/04/2020 1551   ALKPHOS 36 (L) 04/04/2020 1551   BILITOT 0.5 04/04/2020 1551   GFRNONAA 45 (L) 03/30/2020 0024   GFRAA 59 (L) 12/28/2019 1148       Component Value Date/Time   WBC 5.1 04/04/2020 1551   RBC 3.46 (L) 04/04/2020 1551   HGB 10.7 (L) 04/04/2020 1551   HGB 12.2 12/28/2019 1148   HGB 12.0 06/16/2011 0953   HCT 32.1 (L) 04/04/2020 1551   HCT 34.9 12/28/2019 1148   HCT 35.6 06/16/2011 0953   PLT 320.0 04/04/2020 1551   PLT 267 12/28/2019 1148   MCV 92.6 04/04/2020 1551   MCV 87 12/28/2019 1148   MCV 86.6 06/16/2011 0953   MCH 30.6 03/31/2020 0042   MCHC 33.3 04/04/2020 1551   RDW 15.3 04/04/2020 1551   RDW 13.4 12/28/2019 1148   RDW 14.1 06/16/2011 0953   LYMPHSABS 1.0 04/04/2020 1551   LYMPHSABS 1.6 12/28/2019 1148   LYMPHSABS 1.7 06/16/2011 0953   MONOABS 0.4 04/04/2020 1551   MONOABS 0.3 06/16/2011 0953   EOSABS 0.1 04/04/2020 1551   EOSABS 0.2 12/28/2019 1148   BASOSABS 0.0 04/04/2020 1551   BASOSABS 0.1 12/28/2019 1148   BASOSABS 0.1 06/16/2011 0953    No results  found for: POCLITH, LITHIUM   No results found for: PHENYTOIN, PHENOBARB, VALPROATE, CBMZ   .res Assessment: Plan:    Time spent reviewing changes in patient's medical history, recent hospitalization, and mental status changes.  Discussed attempting to reduce patient's psychiatric medications to potentially minimize polypharmacy, sedation/risk for falls, and to possibly improve mental status.  Also discussed that patient's psychiatric signs and symptoms have recently been very well controlled and that several significant psychosocial stressors that she has had in the past have since resolved.  Discussed gradual dose reductions to determine if patient has any worsening signs and symptoms.  Discussed that Trileptal can cause dizziness and that she is currently on a low dose and could be titrated off of Trileptal within 2 weeks.  Also discussed that dose of Seroquel XR could be reduced since higher doses could contribute to sedation and unsteadiness. Agreed on plan to reduce Trileptal to 150 mg at bedtime for 5 nights, then decrease to 1/2 tablet at bedtime for 5 nights, then stop. Requested that patient's daughter contact office is in 3 weeks to provide update on is doing  after discontinuation of Trileptal with plan to reduce Seroquel XR at that time if patient is not having any worsening psychiatric signs and symptoms.  Discussed next dose reduction would be from Seroquel XR 600 mg in the evening to Seroquel XR 400 mg in the evening. Will continue Seroquel XR 600 mg at this time while titrating off Trileptal. Continue lithium 100 mg in the morning and 200 mg at bedtime. Continue Effexor XR 75 mg daily for anxiety and depression. Patient to follow-up in 7 to 8 weeks or sooner if clinically indicated. Patient advised to contact office with any questions, adverse effects, or acute worsening in signs and symptoms.  Nikoleta was seen today for other and follow-up.  Diagnoses and all orders for this visit:   Bipolar II disorder ([place]) -     OXcarbazepine (TRILEPTAL) 150 MG tablet; Decrease to 1 tablet at bedtime for 5 nights, then decrease to 1/2 tablet at bedtime for 5 nights, then stop.  Generalized anxiety disorder -     OXcarbazepine (TRILEPTAL) 150 MG tablet; Decrease to 1 tablet at bedtime for 5 nights, then decrease to 1/2 tablet at bedtime for 5 nights, then stop.  Insomnia due to other mental disorder    Please see After Visit Summary for patient specific instructions.  Future Appointments  Date Time Provider [place]  04/25/2020  2:00 PM Evans Lance, MD [facility]  04/25/2020  3:15 PM [facility]  05/03/2020  2:00 PM Mosie Lukes, MD [facility]  06/08/2020  2:00 PM Belva Crome, MD [facility]  07/11/2020  7:40 AM [facility] DEVICE REMOTES [facility]  10/10/2020  7:40 AM [facility] DEVICE REMOTES [facility]  01/09/2021  7:40 AM [facility] DEVICE REMOTES [facility]  04/10/2021  7:40 AM [facility] DEVICE REMOTES [facility]  07/10/2021  7:40 AM [facility] DEVICE REMOTES [facility]  10/09/2021  7:40 AM [facility] DEVICE REMOTES [facility]    No orders of the defined types were placed in this encounter.     -------------------------------

## 2020-04-13 NOTE — Progress Notes (Signed)
Remote pacemaker transmission.   

## 2020-04-14 ENCOUNTER — Encounter: Payer: Self-pay | Admitting: Psychiatry

## 2020-04-24 ENCOUNTER — Encounter: Payer: PPO | Admitting: Internal Medicine

## 2020-04-25 ENCOUNTER — Encounter: Payer: Self-pay | Admitting: Internal Medicine

## 2020-04-25 ENCOUNTER — Other Ambulatory Visit: Payer: Self-pay

## 2020-04-25 ENCOUNTER — Ambulatory Visit (INDEPENDENT_AMBULATORY_CARE_PROVIDER_SITE_OTHER): Payer: PPO | Admitting: *Deleted

## 2020-04-25 ENCOUNTER — Ambulatory Visit: Payer: PPO | Admitting: Internal Medicine

## 2020-04-25 VITALS — BP 152/68 | HR 70 | Ht 68.0 in | Wt 207.0 lb

## 2020-04-25 DIAGNOSIS — M6281 Muscle weakness (generalized): Secondary | ICD-10-CM | POA: Diagnosis not present

## 2020-04-25 DIAGNOSIS — L89892 Pressure ulcer of other site, stage 2: Secondary | ICD-10-CM | POA: Diagnosis not present

## 2020-04-25 DIAGNOSIS — Z950 Presence of cardiac pacemaker: Secondary | ICD-10-CM | POA: Diagnosis not present

## 2020-04-25 DIAGNOSIS — J690 Pneumonitis due to inhalation of food and vomit: Secondary | ICD-10-CM | POA: Diagnosis not present

## 2020-04-25 DIAGNOSIS — R001 Bradycardia, unspecified: Secondary | ICD-10-CM | POA: Diagnosis not present

## 2020-04-25 DIAGNOSIS — G9341 Metabolic encephalopathy: Secondary | ICD-10-CM | POA: Diagnosis not present

## 2020-04-25 DIAGNOSIS — E119 Type 2 diabetes mellitus without complications: Secondary | ICD-10-CM | POA: Diagnosis not present

## 2020-04-25 DIAGNOSIS — Z5181 Encounter for therapeutic drug level monitoring: Secondary | ICD-10-CM | POA: Diagnosis not present

## 2020-04-25 DIAGNOSIS — I4891 Unspecified atrial fibrillation: Secondary | ICD-10-CM | POA: Diagnosis not present

## 2020-04-25 DIAGNOSIS — R918 Other nonspecific abnormal finding of lung field: Secondary | ICD-10-CM | POA: Diagnosis not present

## 2020-04-25 DIAGNOSIS — I495 Sick sinus syndrome: Secondary | ICD-10-CM | POA: Diagnosis not present

## 2020-04-25 DIAGNOSIS — K5900 Constipation, unspecified: Secondary | ICD-10-CM | POA: Diagnosis not present

## 2020-04-25 DIAGNOSIS — N390 Urinary tract infection, site not specified: Secondary | ICD-10-CM | POA: Diagnosis not present

## 2020-04-25 DIAGNOSIS — D649 Anemia, unspecified: Secondary | ICD-10-CM | POA: Diagnosis not present

## 2020-04-25 DIAGNOSIS — F319 Bipolar disorder, unspecified: Secondary | ICD-10-CM | POA: Diagnosis not present

## 2020-04-25 DIAGNOSIS — G43909 Migraine, unspecified, not intractable, without status migrainosus: Secondary | ICD-10-CM | POA: Diagnosis not present

## 2020-04-25 DIAGNOSIS — Z952 Presence of prosthetic heart valve: Secondary | ICD-10-CM | POA: Diagnosis not present

## 2020-04-25 DIAGNOSIS — I10 Essential (primary) hypertension: Secondary | ICD-10-CM | POA: Diagnosis not present

## 2020-04-25 LAB — POCT INR: INR: 2.4 (ref 2.0–3.0)

## 2020-04-25 LAB — CUP PACEART INCLINIC DEVICE CHECK
Brady Statistic RV Percent Paced: 43 %
Date Time Interrogation Session: 20211201170105
Implantable Lead Implant Date: 20210816
Implantable Lead Implant Date: 20210816
Implantable Lead Location: 753859
Implantable Lead Location: 753860
Implantable Lead Model: 7842
Implantable Lead Model: 7842
Implantable Lead Serial Number: 1013287
Implantable Lead Serial Number: 1091261
Implantable Pulse Generator Implant Date: 20210816
Lead Channel Impedance Value: 510 Ohm
Lead Channel Impedance Value: 746 Ohm
Lead Channel Pacing Threshold Amplitude: 0.8 V
Lead Channel Pacing Threshold Pulse Width: 0.4 ms
Lead Channel Sensing Intrinsic Amplitude: 10.9 mV
Lead Channel Setting Pacing Amplitude: 1.3 V
Lead Channel Setting Pacing Pulse Width: 0.4 ms
Lead Channel Setting Sensing Sensitivity: 2.5 mV
Pulse Gen Serial Number: 940432

## 2020-04-25 NOTE — Progress Notes (Signed)
HPI [name] returns today for ongoing evaluation of persistent atrial fib. She is a pleasant 69 yo woman with bipolar disorder, AI, s/p AVR, sinus node dysfunction and prolongation of the QT due to taking multiple meds for her bipolar disorder. She notes that her bipolar is well controlled on her QT prolonging meds. With symptomatic tachy-brady, she underwent PPM insertion approx 3 months ago. In the interim, she notes no pain from her device. She is still weak but denies palpitations.  Allergies  Allergen Reactions   Mucinex [Guaifenesin Er] Other (See Comments)    Severe headaches    Keflex [Cephalexin] Other (See Comments)    Headaches and dizziness   Sulfa Antibiotics Other (See Comments)    headaches   Sulfonamide Derivatives Other (See Comments)    Headaches      Current Outpatient Medications  Medication Sig Dispense Refill   acetaminophen (TYLENOL) 650 MG CR tablet Take 1,300 mg by mouth every 8 (eight) hours as needed for pain.     amiodarone (PACERONE) 200 MG tablet TAKE 1 TABLET BY MOUTH TWICE A DAY (Patient taking differently: Take 200 mg by mouth 2 (two) times daily. ) 180 tablet 3   aspirin EC 81 MG tablet Take 81 mg by mouth daily.     butalbital-acetaminophen-caffeine (FIORICET) 50-325-40 MG tablet Take 1 tablet by mouth as needed for migraine.     furosemide (LASIX) 40 MG tablet Take 1 tablet (40 mg total) by mouth daily as needed for edema. 90 tablet 3   glucose blood (ONETOUCH VERIO) test strip Check blood sugars once daily Dx: E11.9 100 each 12   lamoTRIgine (LAMICTAL) 100 MG tablet Take 100 mg by mouth in the morning and 200 mg by mouth at bedtime (Patient taking differently: Take 100-200 mg by mouth See admin instructions. Take 100 mg by mouth in the morning and 200 mg by mouth at bedtime) 270 tablet 1   Lancets (ONETOUCH ULTRASOFT) lancets Use as instructed to test blood sugars once daily Dx. E11.65 100 each 12   metoprolol succinate (TOPROL XL) 25 MG  24 hr tablet Take 0.5 tablets (12.5 mg total) by mouth daily. 90 tablet 1   ONE TOUCH LANCETS MISC Test once daily to check blood sugar. DX E11.9 100 each 3   SUMAtriptan (IMITREX) 50 MG tablet TAKE ONE TABLET EVERY 2 HOURS AS NEEDED FOR MIGRAINE. MAY REPEAT IN 2 HOURS IF HEADACHE PERSISTS OR RECURS. (MAX OF 2 TABLETS IN 24 HOURS) (Patient taking differently: Take 50 mg by mouth every 2 (two) hours as needed for migraine. ) 10 tablet 0   vitamin B-12 1000 MCG tablet Take 1 tablet (1,000 mcg total) by mouth daily. 30 tablet 0   warfarin (COUMADIN) 5 MG tablet Take as directed by [facility]. (Patient taking differently: Take 2.5-5 mg by mouth See admin instructions. Take 2.5mg  on Mondays and Fridays, and 5mg  on all other days.Take every night.) 110 tablet 1   QUEtiapine (SEROQUEL XR) 300 MG 24 hr tablet Take 2 tablets (600 mg total) by mouth every evening. 180 tablet 1   venlafaxine XR (EFFEXOR XR) 75 MG 24 hr capsule Take 1 capsule (75 mg total) by mouth daily with breakfast. 90 capsule 1   No current facility-administered medications for this visit.     Past Medical History:  Diagnosis Date   ANEMIA 05/28/2010   AORTIC VALVE REPLACEMENT, HX OF 03/11/2010   Mechanical prosthesis   BIPOLAR AFFECTIVE DISORDER 03/11/2010  Depression    Diverticulitis 12/18/2010   FIBROIDS, UTERUS 03/11/2010   Gout 09/04/2016   Grave's disease 8-12   Hyperlipidemia associated with type 2 diabetes mellitus ([place]) 06/05/2016   Hypertension    Low back pain 11/02/2017   Migraine 06/05/2016   Mixed hyperlipidemia 03/11/2010   Obesity    Skin cancer    Syncope 08/22/2019   UTI (lower urinary tract infection) 08/14/2011    ROS:   All systems reviewed and negative except as noted in the HPI.   Past Surgical History:  Procedure Laterality Date   ABDOMINAL HYSTERECTOMY  ?1998   sb/l spo, total for fibroids/heavy bleeding   ANKLE SURGERY Left 01/2018   hardware removal   AORTIC VALVE REPLACEMENT  2011    Mechanical prosthesis, [employer]   APPENDECTOMY     Required revision for EColi infection, required recurrent packing   bowel obstruction     Requiring adhesions to be removed   CHOLECYSTECTOMY     COLONOSCOPY WITH PROPOFOL N/A 11/01/2015   Procedure: COLONOSCOPY WITH PROPOFOL;  Surgeon: Milus Banister, MD;  Location: [facility];  Service: Endoscopy;  Laterality: N/A;   CYSTOCELE REPAIR N/A 10/16/2015   Procedure: ANTERIOR REPAIR (CYSTOCELE);  Surgeon: Donnamae Jude, MD;  Location: [facility];  Service: Gynecology;  Laterality: N/A;   HERNIA REPAIR  08-16-10   LEFT HEART CATHETERIZATION WITH CORONARY ANGIOGRAM N/A 12/05/2011   Procedure: LEFT HEART CATHETERIZATION WITH CORONARY ANGIOGRAM;  Surgeon: Sinclair Grooms, MD;  Location: [facility];  Service: Cardiovascular;  Laterality: N/A;   PACEMAKER IMPLANT N/A 01/09/2020   Procedure: PACEMAKER IMPLANT;  Surgeon: Evans Lance, MD;  Location: [facility];  Service: Cardiovascular;  Laterality: N/A;   TOTAL KNEE ARTHROPLASTY Right 02/24/2013   Procedure: RIGHT TOTAL KNEE ARTHROPLASTY;  Surgeon: Johnn Hai, MD;  Location: [facility];  Service: Orthopedics;  Laterality: Right;   TUBAL LIGATION     varicose vein surgery b/l legs       Family History  Problem Relation Age of Onset   Scoliosis Mother    Arthritis Mother        Rheumatoid   Aneurysm Mother        heart   Alcohol abuse Father    Depression Sister    Depression Brother    Alcohol abuse Brother    Cancer Maternal Grandmother        colon   Diabetes Maternal Grandfather    Heart disease Maternal Grandfather    Alcohol abuse Maternal Grandfather    Depression Paternal Grandmother    Diabetes Paternal Grandmother    Depression Paternal Grandfather    Diabetes Paternal Grandfather    Depression Sister    Alcohol abuse Brother    Depression Brother    Heart disease Brother      Social History   Socioeconomic History   Marital status: Married    Spouse name: Not  on file   Number of children: Not on file   Years of education: Not on file   Highest education level: Not on file  Occupational History   Not on file  Tobacco Use   Smoking status: Former Smoker    Quit date: 05/26/1990    Years since quitting: 29.9   Smokeless tobacco: Never Used  Vaping Use   Vaping Use: Never used  Substance and Sexual Activity   Alcohol use: Yes    Alcohol/week: 0.0 standard drinks    Comment: rare  Drug use: No   Sexual activity: Not Currently  Other Topics Concern   Not on file  Social History Narrative   Not on file   Social Determinants of Health   Financial Resource Strain:    Difficulty of Paying Living Expenses: Not on file  Food Insecurity:    Worried About Liberal in the Last Year: Not on file   Ran Out of Food in the Last Year: Not on file  Transportation Needs:    Lack of Transportation (Medical): Not on file   Lack of Transportation (Non-Medical): Not on file  Physical Activity:    Days of Exercise per Week: Not on file   Minutes of Exercise per Session: Not on file  Stress:    Feeling of Stress : Not on file  Social Connections:    Frequency of Communication with Friends and Family: Not on file   Frequency of Social Gatherings with Friends and Family: Not on file   Attends Religious Services: Not on file   Active Member of [place] or Organizations: Not on file   Attends Archivist Meetings: Not on file   Marital Status: Not on file  Intimate Partner Violence:    Fear of Current or Ex-Partner: Not on file   Emotionally Abused: Not on file   Physically Abused: Not on file   Sexually Abused: Not on file     BP (!) 152/68   Pulse 70   Ht 5\' 8"  (1.727 m)   Wt 207 lb (93.9 kg)   SpO2 98%   BMI 31.47 kg/m   Physical Exam:  Well appearing NAD HEENT: Unremarkable Neck:  No JVD, no thyromegally Lymphatics:  No adenopathy Back:  No CVA tenderness Lungs:  Clear with no wheezes HEART:  Regular rate rhythm,  no murmurs, no rubs, no clicks Abd:  soft, positive bowel sounds, no organomegally, no rebound, no guarding Ext:  2 plus pulses, no edema, no cyanosis, no clubbing Skin:  No rashes no nodules Neuro:  CN II through XII intact, motor grossly intact  EKG - atrial fib with a controlled VR  DEVICE  Normal device function.  See PaceArt for details.   Assess/Plan: 1. Tachy brady syndrome - her rates are better controlled on amiodarone. 2. PAF - she is still out of rhythm and we will plan for DCCV now that she has been loaded on amiodarone 3. PPM - her [employer] DDD PM is working normally. 4. Chronic systolic/diastolic heart failure - her symptoms are class 2. She will continue her current meds. Hopefully she will improve further with NSR.  Carleene Overlie Woodard Perrell,MD

## 2020-04-25 NOTE — H&P (View-Only) (Signed)
HPI [name] returns today for ongoing evaluation of persistent atrial fib. She is a pleasant 69 yo woman with bipolar disorder, AI, s/p AVR, sinus node dysfunction and prolongation of the QT due to taking multiple meds for her bipolar disorder. She notes that her bipolar is well controlled on her QT prolonging meds. With symptomatic tachy-brady, she underwent PPM insertion approx 3 months ago. In the interim, she notes no pain from her device. She is still weak but denies palpitations.  Allergies  Allergen Reactions   Mucinex [Guaifenesin Er] Other (See Comments)    Severe headaches    Keflex [Cephalexin] Other (See Comments)    Headaches and dizziness   Sulfa Antibiotics Other (See Comments)    headaches   Sulfonamide Derivatives Other (See Comments)    Headaches      Current Outpatient Medications  Medication Sig Dispense Refill   acetaminophen (TYLENOL) 650 MG CR tablet Take 1,300 mg by mouth every 8 (eight) hours as needed for pain.     amiodarone (PACERONE) 200 MG tablet TAKE 1 TABLET BY MOUTH TWICE A DAY (Patient taking differently: Take 200 mg by mouth 2 (two) times daily. ) 180 tablet 3   aspirin EC 81 MG tablet Take 81 mg by mouth daily.     butalbital-acetaminophen-caffeine (FIORICET) 50-325-40 MG tablet Take 1 tablet by mouth as needed for migraine.     furosemide (LASIX) 40 MG tablet Take 1 tablet (40 mg total) by mouth daily as needed for edema. 90 tablet 3   glucose blood (ONETOUCH VERIO) test strip Check blood sugars once daily Dx: E11.9 100 each 12   lamoTRIgine (LAMICTAL) 100 MG tablet Take 100 mg by mouth in the morning and 200 mg by mouth at bedtime (Patient taking differently: Take 100-200 mg by mouth See admin instructions. Take 100 mg by mouth in the morning and 200 mg by mouth at bedtime) 270 tablet 1   Lancets (ONETOUCH ULTRASOFT) lancets Use as instructed to test blood sugars once daily Dx. E11.65 100 each 12   metoprolol succinate (TOPROL XL) 25 MG  24 hr tablet Take 0.5 tablets (12.5 mg total) by mouth daily. 90 tablet 1   ONE TOUCH LANCETS MISC Test once daily to check blood sugar. DX E11.9 100 each 3   SUMAtriptan (IMITREX) 50 MG tablet TAKE ONE TABLET EVERY 2 HOURS AS NEEDED FOR MIGRAINE. MAY REPEAT IN 2 HOURS IF HEADACHE PERSISTS OR RECURS. (MAX OF 2 TABLETS IN 24 HOURS) (Patient taking differently: Take 50 mg by mouth every 2 (two) hours as needed for migraine. ) 10 tablet 0   vitamin B-12 1000 MCG tablet Take 1 tablet (1,000 mcg total) by mouth daily. 30 tablet 0   warfarin (COUMADIN) 5 MG tablet Take as directed by [facility]madin Clinic. (Patient taking differently: Take 2.5-5 mg by mouth See admin instructions. Take 2.5mg  on Mondays and Fridays, and 5mg  on all other days.Take every night.) 110 tablet 1   QUEtiapine (SEROQUEL XR) 300 MG 24 hr tablet Take 2 tablets (600 mg total) by mouth every evening. 180 tablet 1   venlafaxine XR (EFFEXOR XR) 75 MG 24 hr capsule Take 1 capsule (75 mg total) by mouth daily with breakfast. 90 capsule 1   No current facility-administered medications for this visit.     Past Medical History:  Diagnosis Date   ANEMIA 05/28/2010   AORTIC VALVE REPLACEMENT, HX OF 03/11/2010   Mechanical prosthesis   BIPOLAR AFFECTIVE DISORDER 03/11/2010  Depression    Diverticulitis 12/18/2010   FIBROIDS, UTERUS 03/11/2010   Gout 09/04/2016   Grave's disease 8-12   Hyperlipidemia associated with type 2 diabetes mellitus ([place]) 06/05/2016   Hypertension    Low back pain 11/02/2017   Migraine 06/05/2016   Mixed hyperlipidemia 03/11/2010   Obesity    Skin cancer    Syncope 08/22/2019   UTI (lower urinary tract infection) 08/14/2011    ROS:   All systems reviewed and negative except as noted in the HPI.   Past Surgical History:  Procedure Laterality Date   ABDOMINAL HYSTERECTOMY  ?1998   sb/l spo, total for fibroids/heavy bleeding   ANKLE SURGERY Left 01/2018   hardware removal   AORTIC VALVE REPLACEMENT  2011    Mechanical prosthesis, [employer]   APPENDECTOMY     Required revision for EColi infection, required recurrent packing   bowel obstruction     Requiring adhesions to be removed   CHOLECYSTECTOMY     COLONOSCOPY WITH PROPOFOL N/A 11/01/2015   Procedure: COLONOSCOPY WITH PROPOFOL;  Surgeon: Milus Banister, MD;  Location: [facility];  Service: Endoscopy;  Laterality: N/A;   CYSTOCELE REPAIR N/A 10/16/2015   Procedure: ANTERIOR REPAIR (CYSTOCELE);  Surgeon: Donnamae Jude, MD;  Location: [facility];  Service: Gynecology;  Laterality: N/A;   HERNIA REPAIR  08-16-10   LEFT HEART CATHETERIZATION WITH CORONARY ANGIOGRAM N/A 12/05/2011   Procedure: LEFT HEART CATHETERIZATION WITH CORONARY ANGIOGRAM;  Surgeon: Sinclair Grooms, MD;  Location: [facility] CATH LAB;  Service: Cardiovascular;  Laterality: N/A;   PACEMAKER IMPLANT N/A 01/09/2020   Procedure: PACEMAKER IMPLANT;  Surgeon: Evans Lance, MD;  Location: [facility];  Service: Cardiovascular;  Laterality: N/A;   TOTAL KNEE ARTHROPLASTY Right 02/24/2013   Procedure: RIGHT TOTAL KNEE ARTHROPLASTY;  Surgeon: Johnn Hai, MD;  Location: [facility];  Service: Orthopedics;  Laterality: Right;   TUBAL LIGATION     varicose vein surgery b/l legs       Family History  Problem Relation Age of Onset   Scoliosis Mother    Arthritis Mother        Rheumatoid   Aneurysm Mother        heart   Alcohol abuse Father    Depression Sister    Depression Brother    Alcohol abuse Brother    Cancer Maternal Grandmother        colon   Diabetes Maternal Grandfather    Heart disease Maternal Grandfather    Alcohol abuse Maternal Grandfather    Depression Paternal Grandmother    Diabetes Paternal Grandmother    Depression Paternal Grandfather    Diabetes Paternal Grandfather    Depression Sister    Alcohol abuse Brother    Depression Brother    Heart disease Brother      Social History   Socioeconomic History   Marital status: Married    Spouse name: Not  on file   Number of children: Not on file   Years of education: Not on file   Highest education level: Not on file  Occupational History   Not on file  Tobacco Use   Smoking status: Former Smoker    Quit date: 05/26/1990    Years since quitting: 29.9   Smokeless tobacco: Never Used  Vaping Use   Vaping Use: Never used  Substance and Sexual Activity   Alcohol use: Yes    Alcohol/week: 0.0 standard drinks    Comment: rare  Drug use: No   Sexual activity: Not Currently  Other Topics Concern   Not on file  Social History Narrative   Not on file   Social Determinants of Health   Financial Resource Strain:    Difficulty of Paying Living Expenses: Not on file  Food Insecurity:    Worried About [place] in the Last Year: Not on file   Ran Out of Food in the Last Year: Not on file  Transportation Needs:    Lack of Transportation (Medical): Not on file   Lack of Transportation (Non-Medical): Not on file  Physical Activity:    Days of Exercise per Week: Not on file   Minutes of Exercise per Session: Not on file  Stress:    Feeling of Stress : Not on file  Social Connections:    Frequency of Communication with Friends and Family: Not on file   Frequency of Social Gatherings with Friends and Family: Not on file   Attends Religious Services: Not on file   Active Member of Clubs or Organizations: Not on file   Attends Archivist Meetings: Not on file   Marital Status: Not on file  Intimate Partner Violence:    Fear of Current or Ex-Partner: Not on file   Emotionally Abused: Not on file   Physically Abused: Not on file   Sexually Abused: Not on file     BP (!) 152/68   Pulse 70   Ht 5\' 8"  (1.727 m)   Wt 207 lb (93.9 kg)   SpO2 98%   BMI 31.47 kg/m   Physical Exam:  Well appearing NAD HEENT: Unremarkable Neck:  No JVD, no thyromegally Lymphatics:  No adenopathy Back:  No CVA tenderness Lungs:  Clear with no wheezes HEART:  Regular rate rhythm,  no murmurs, no rubs, no clicks Abd:  soft, positive bowel sounds, no organomegally, no rebound, no guarding Ext:  2 plus pulses, no edema, no cyanosis, no clubbing Skin:  No rashes no nodules Neuro:  CN II through XII intact, motor grossly intact  EKG - atrial fib with a controlled VR  DEVICE  Normal device function.  See PaceArt for details.   Assess/Plan: 1. Tachy brady syndrome - her rates are better controlled on amiodarone. 2. PAF - she is still out of rhythm and we will plan for DCCV now that she has been loaded on amiodarone 3. PPM - her [employer] DDD PM is working normally. 4. Chronic systolic/diastolic heart failure - her symptoms are class 2. She will continue her current meds. Hopefully she will improve further with NSR.  Cynthia Overlie Keandra Medero,MD

## 2020-04-25 NOTE — Patient Instructions (Addendum)
Medication Instructions:  Your physician recommends that you continue on your current medications as directed. Please refer to the Current Medication list given to you today.  Labwork: None ordered.  Testing/Procedures: Your physician has recommended that you have a Cardioversion (DCCV). Electrical Cardioversion uses a jolt of electricity to your heart either through paddles or wired patches attached to your chest. This is a controlled, usually prescheduled, procedure. Defibrillation is done under light anesthesia in the hospital, and you usually go home the day of the procedure. This is done to get your heart back into a normal rhythm. You are not awake for the procedure. Please see the instruction sheet given to you today.  Follow-Up:  You will follow up with Dr. Lovena Le in 4 months.  Remote monitoring is used to monitor your Pacemaker from home. This monitoring reduces the number of office visits required to check your device to one time per year. It allows [place] to keep an eye on the functioning of your device to ensure it is working properly. You are scheduled for a device check from home on 07/11/2020. You may send your transmission at any time that day. If you have a wireless device, the transmission will be sent automatically. After your physician reviews your transmission, you will receive a postcard with your next transmission date.  Any Other Special Instructions Will Be Listed Below (If Applicable).                                    CARDIOVERSION INSTRUCTIONS:  COVID TEST- May 07, 2020 at 2:00 pm--This is a Drive Up Visit at [address] [place], [place] [address] Someone will direct you to the appropriate testing line. Stay in your car and someone will be with you shortly.   You are scheduled for a Cardioversion on May 09, 2020 with Dr. Oval Linsey.  Please arrive at the [facility] (Main Entrance A) at [facility]: [address] [place], [place] [address] at  8:00 am.  DIET: Nothing to eat or drink after midnight except a sip of water with medications (see medication instructions below)  Medication Instructions:  On the morning of your procedure do NOT take any medications.  Take all your normal morning medications when you get home from your procedure.  You will need to continue your anticoagulant after your procedure until you are told by your  Provider that it is safe to stop   Labs:  your lab work will be done at the hospital prior to your procedure   You must have a responsible person to drive you home and stay in the waiting area during your procedure. Failure to do so could result in cancellation.  Bring your insurance cards.  *Special Note: Every effort is made to have your procedure done on time. Occasionally there are emergencies that occur at the hospital that may cause delays. Please be patient if a delay does occur.

## 2020-04-25 NOTE — Patient Instructions (Addendum)
Description    Take 1.5 tablets today and then continue on same dosage 1 tablet daily except for 1/2 a tablet on Mondays and Fridays.  Recheck in 1.5 weeks- DCCV on 12/15. Pt eats a salad daily.  Call coumadin clinic for any changes in medications or up coming procedures.

## 2020-04-26 ENCOUNTER — Telehealth: Payer: Self-pay | Admitting: Family Medicine

## 2020-04-26 ENCOUNTER — Encounter: Payer: Self-pay | Admitting: Family Medicine

## 2020-04-26 ENCOUNTER — Ambulatory Visit (INDEPENDENT_AMBULATORY_CARE_PROVIDER_SITE_OTHER): Payer: PPO | Admitting: Family Medicine

## 2020-04-26 ENCOUNTER — Other Ambulatory Visit: Payer: Self-pay

## 2020-04-26 VITALS — HR 63 | Temp 96.2°F | Resp 16

## 2020-04-26 DIAGNOSIS — R5381 Other malaise: Secondary | ICD-10-CM

## 2020-04-26 DIAGNOSIS — R2689 Other abnormalities of gait and mobility: Secondary | ICD-10-CM

## 2020-04-26 DIAGNOSIS — R410 Disorientation, unspecified: Secondary | ICD-10-CM | POA: Diagnosis not present

## 2020-04-26 DIAGNOSIS — R8290 Unspecified abnormal findings in urine: Secondary | ICD-10-CM | POA: Diagnosis not present

## 2020-04-26 DIAGNOSIS — N390 Urinary tract infection, site not specified: Secondary | ICD-10-CM | POA: Diagnosis not present

## 2020-04-26 LAB — POCT URINALYSIS DIPSTICK OB
Bilirubin, UA: NEGATIVE
Blood, UA: NEGATIVE
Glucose, UA: NEGATIVE
Ketones, UA: NEGATIVE
Leukocytes, UA: NEGATIVE
Nitrite, UA: NEGATIVE
POC,PROTEIN,UA: NEGATIVE
Spec Grav, UA: 1.015 (ref 1.010–1.025)
Urobilinogen, UA: 0.2 E.U./dL
pH, UA: 5.5 (ref 5.0–8.0)

## 2020-04-26 MED ORDER — NITROFURANTOIN MONOHYD MACRO 100 MG PO CAPS: 100.0000 mg | ORAL_CAPSULE | Freq: Two times a day (BID) | ORAL | 0 refills | Status: DC

## 2020-04-26 NOTE — Telephone Encounter (Signed)
Caller name: Aaron Edelman- OT ([place] ) Call back [phone]  Aaron Edelman states upon arrival to patient's house he was informed by patients daughter's that the patient has fallen 3 times in the past 3 days.  He would like some verbal order for  1- Nurse - To assess patient legs wounds 2- Speech Therapy- To do a cognitive Assessment  Ok to leave message

## 2020-04-26 NOTE — Telephone Encounter (Signed)
Pt seen today

## 2020-04-26 NOTE — Progress Notes (Signed)
Chief Complaint  Patient presents with  . Urinary Tract Infection    possibly 3 days     [name] is a 69 y.o. female here for possible UTI. Here w daughter.   Duration: 3 days. Symptoms: more freq falls, confusion, urinary odor; unfortunately she rarely has symptoms when she has UTIs.  Less than a month ago, she was in the hospital for acute cystitis with E. coli growing on culture.  It was resistant to multiple drugs.  She had similar symptoms. Denies: urinary frequency, hematuria, urinary hesitancy, urinary retention, fever, nausea, vomiting and flank pain, vaginal discharge Hx of recurrent UTI? Yes Denies new sexual partners.  The patient has been having worsening balance and strength.  She was just discharged from physical therapy.  Her functional status seems to wax and wane.  Currently, she has not been doing much as there are stairs in her abode.  Her daughter is a Marine scientist and is able to help to a certain extent, but she does need to work and is currently homeschooling her own children.  They have weaned down much of her medication as polypharmacy was thought to contribute to her balance issues.  She is on Coumadin for mechanical heart valve.  She also has atrial fibrillation.  Past Medical History:  Diagnosis Date  . ANEMIA 05/28/2010  . AORTIC VALVE REPLACEMENT, HX OF 03/11/2010   Mechanical prosthesis  . BIPOLAR AFFECTIVE DISORDER 03/11/2010  . Depression   . Diverticulitis 12/18/2010  . FIBROIDS, UTERUS 03/11/2010  . Gout 09/04/2016  . Grave's disease 8-12  . Hyperlipidemia associated with type 2 diabetes mellitus ([place]) 06/05/2016  . Hypertension   . Low back pain 11/02/2017  . Migraine 06/05/2016  . Mixed hyperlipidemia 03/11/2010  . Obesity   . Skin cancer   . Syncope 08/22/2019  . UTI (lower urinary tract infection) 08/14/2011     Pulse 63   Temp (!) 96.2 F (35.7 C) (Temporal)   Resp 16   SpO2 95%  General: Awake, alert, appears stated age Heart: RRR Lungs:  CTAB, normal respiratory effort, no accessory muscle usage Abd: BS+, soft, NT, ND, no masses or organomegaly MSK: No CVA tenderness, neg Lloyd's sign Neuro: The patient is nonambulatory and in a wheelchair Psych: Limited judgment and insight; normal mood and affect  Abnormal urine odor - Plan: POC Urinalysis Dipstick  Physical deconditioning - Plan: Ambulatory referral to [employer] problem - Plan: Ambulatory referral to Home Health  Confusion  Frequent UTI - Plan: Ambulatory referral to Urology, POC Urinalysis Dipstick OB, CANCELED: POCT UA - Glucose/Protein  Urinary issue: Stay hydrated.  Based on her most recent culture, will prescribe nitrofurantoin for 7 days.  Seek immediate care if pt starts to develop fevers, new/worsening symptoms, uncontrollable N/V.  With her more frequent urinary tract infections, we did discuss referring her to the urology team for their opinion.  They agreed to this so we will place a referral. Mobility: Activity as tolerated.  I do not want her following with an INR of 2.4 as she is on Coumadin.  I will reorder home health for physical therapy and home health aide.  If she does not improve over the next couple days with treatment of a UTI, will consider seeking more emergent care. The patient and her daughter voiced understanding and agreement to the plan.  Greater than 40 minutes were spent face to face with the patient and her daughter, reviewing previous hospitalization records, and reviewing her own  history as she is not a patient of mine.   [place], DO 04/26/20 4:47 PM

## 2020-04-26 NOTE — Patient Instructions (Signed)
If you do not hear anything about your referral in the next 1-2 weeks, call our office and ask for an update.  Stay hydrated.   Warning signs/symptoms: Uncontrollable nausea/vomiting, fevers, worsening symptoms despite treatment, confusion.  Give [place] around 2 business days to get culture back to you.  If we get worse and the medicine isn't helping, seek emergent care.  Let us know if you need anything.

## 2020-04-27 DIAGNOSIS — R8290 Unspecified abnormal findings in urine: Secondary | ICD-10-CM | POA: Diagnosis not present

## 2020-04-27 NOTE — Addendum Note (Signed)
Addended by: Corinna Capra D on: 04/27/2020 07:31 AM   Modules accepted: Orders

## 2020-04-28 LAB — URINE CULTURE
MICRO NUMBER:: 11274366
Result:: NO GROWTH
SPECIMEN QUALITY:: ADEQUATE

## 2020-04-29 ENCOUNTER — Encounter (HOSPITAL_COMMUNITY): Payer: Self-pay | Admitting: Emergency Medicine

## 2020-04-29 ENCOUNTER — Emergency Department (HOSPITAL_COMMUNITY): Payer: PPO

## 2020-04-29 ENCOUNTER — Inpatient Hospital Stay (HOSPITAL_COMMUNITY)
Admission: RE | Admit: 2020-04-29 | Discharge: 2020-05-03 | DRG: 644 | Disposition: A | Discharge status: Home Health | Payer: PPO | Attending: Family Medicine | Admitting: Family Medicine

## 2020-04-29 ENCOUNTER — Other Ambulatory Visit: Payer: Self-pay

## 2020-04-29 DIAGNOSIS — I110 Hypertensive heart disease with heart failure: Secondary | ICD-10-CM | POA: Diagnosis present

## 2020-04-29 DIAGNOSIS — R296 Repeated falls: Secondary | ICD-10-CM | POA: Diagnosis not present

## 2020-04-29 DIAGNOSIS — E119 Type 2 diabetes mellitus without complications: Secondary | ICD-10-CM | POA: Diagnosis present

## 2020-04-29 DIAGNOSIS — R519 Headache, unspecified: Secondary | ICD-10-CM | POA: Diagnosis not present

## 2020-04-29 DIAGNOSIS — R4182 Altered mental status, unspecified: Secondary | ICD-10-CM | POA: Diagnosis present

## 2020-04-29 DIAGNOSIS — Z952 Presence of prosthetic heart valve: Secondary | ICD-10-CM | POA: Diagnosis not present

## 2020-04-29 DIAGNOSIS — E538 Deficiency of other specified B group vitamins: Secondary | ICD-10-CM | POA: Diagnosis not present

## 2020-04-29 DIAGNOSIS — E1142 Type 2 diabetes mellitus with diabetic polyneuropathy: Secondary | ICD-10-CM | POA: Diagnosis not present

## 2020-04-29 DIAGNOSIS — I5042 Chronic combined systolic (congestive) and diastolic (congestive) heart failure: Secondary | ICD-10-CM | POA: Diagnosis not present

## 2020-04-29 DIAGNOSIS — R400 Somnolence: Secondary | ICD-10-CM

## 2020-04-29 DIAGNOSIS — R791 Abnormal coagulation profile: Secondary | ICD-10-CM

## 2020-04-29 DIAGNOSIS — G43909 Migraine, unspecified, not intractable, without status migrainosus: Secondary | ICD-10-CM | POA: Diagnosis present

## 2020-04-29 DIAGNOSIS — Z8249 Family history of ischemic heart disease and other diseases of the circulatory system: Secondary | ICD-10-CM

## 2020-04-29 DIAGNOSIS — I482 Chronic atrial fibrillation, unspecified: Secondary | ICD-10-CM | POA: Diagnosis not present

## 2020-04-29 DIAGNOSIS — R0689 Other abnormalities of breathing: Secondary | ICD-10-CM | POA: Diagnosis not present

## 2020-04-29 DIAGNOSIS — F319 Bipolar disorder, unspecified: Secondary | ICD-10-CM | POA: Diagnosis not present

## 2020-04-29 DIAGNOSIS — R410 Disorientation, unspecified: Secondary | ICD-10-CM | POA: Diagnosis not present

## 2020-04-29 DIAGNOSIS — R748 Abnormal levels of other serum enzymes: Secondary | ICD-10-CM

## 2020-04-29 DIAGNOSIS — I4819 Other persistent atrial fibrillation: Secondary | ICD-10-CM | POA: Diagnosis present

## 2020-04-29 DIAGNOSIS — Z79899 Other long term (current) drug therapy: Secondary | ICD-10-CM

## 2020-04-29 DIAGNOSIS — Z96651 Presence of right artificial knee joint: Secondary | ICD-10-CM | POA: Diagnosis present

## 2020-04-29 DIAGNOSIS — Z7901 Long term (current) use of anticoagulants: Secondary | ICD-10-CM | POA: Diagnosis not present

## 2020-04-29 DIAGNOSIS — N289 Disorder of kidney and ureter, unspecified: Secondary | ICD-10-CM | POA: Diagnosis not present

## 2020-04-29 DIAGNOSIS — Z8261 Family history of arthritis: Secondary | ICD-10-CM

## 2020-04-29 DIAGNOSIS — R9431 Abnormal electrocardiogram [ECG] [EKG]: Secondary | ICD-10-CM | POA: Diagnosis not present

## 2020-04-29 DIAGNOSIS — E039 Hypothyroidism, unspecified: Secondary | ICD-10-CM | POA: Diagnosis not present

## 2020-04-29 DIAGNOSIS — Z833 Family history of diabetes mellitus: Secondary | ICD-10-CM

## 2020-04-29 DIAGNOSIS — R7989 Other specified abnormal findings of blood chemistry: Secondary | ICD-10-CM

## 2020-04-29 DIAGNOSIS — Z950 Presence of cardiac pacemaker: Secondary | ICD-10-CM | POA: Diagnosis not present

## 2020-04-29 DIAGNOSIS — G9340 Encephalopathy, unspecified: Secondary | ICD-10-CM

## 2020-04-29 DIAGNOSIS — F3181 Bipolar II disorder: Secondary | ICD-10-CM | POA: Diagnosis not present

## 2020-04-29 DIAGNOSIS — R404 Transient alteration of awareness: Secondary | ICD-10-CM | POA: Diagnosis not present

## 2020-04-29 DIAGNOSIS — Z87440 Personal history of urinary (tract) infections: Secondary | ICD-10-CM | POA: Diagnosis not present

## 2020-04-29 DIAGNOSIS — Z20822 Contact with and (suspected) exposure to covid-19: Secondary | ICD-10-CM | POA: Diagnosis present

## 2020-04-29 DIAGNOSIS — I4891 Unspecified atrial fibrillation: Secondary | ICD-10-CM | POA: Diagnosis present

## 2020-04-29 DIAGNOSIS — L02416 Cutaneous abscess of left lower limb: Secondary | ICD-10-CM | POA: Diagnosis not present

## 2020-04-29 DIAGNOSIS — Z7982 Long term (current) use of aspirin: Secondary | ICD-10-CM | POA: Diagnosis not present

## 2020-04-29 DIAGNOSIS — Z87891 Personal history of nicotine dependence: Secondary | ICD-10-CM

## 2020-04-29 DIAGNOSIS — L03116 Cellulitis of left lower limb: Secondary | ICD-10-CM | POA: Diagnosis not present

## 2020-04-29 DIAGNOSIS — S0990XA Unspecified injury of head, initial encounter: Secondary | ICD-10-CM | POA: Diagnosis not present

## 2020-04-29 DIAGNOSIS — Z23 Encounter for immunization: Secondary | ICD-10-CM | POA: Diagnosis not present

## 2020-04-29 DIAGNOSIS — I959 Hypotension, unspecified: Secondary | ICD-10-CM | POA: Diagnosis not present

## 2020-04-29 HISTORY — DX: Altered mental status, unspecified: R41.82

## 2020-04-29 LAB — CBC WITH DIFFERENTIAL/PLATELET
Abs Immature Granulocytes: 0.01 10*3/uL (ref 0.00–0.07)
Basophils Absolute: 0.1 10*3/uL (ref 0.0–0.1)
Basophils Relative: 2 %
Eosinophils Absolute: 0.2 10*3/uL (ref 0.0–0.5)
Eosinophils Relative: 3 %
HCT: 40.1 % (ref 36.0–46.0)
Hemoglobin: 12.6 g/dL (ref 12.0–15.0)
Immature Granulocytes: 0 %
Lymphocytes Relative: 33 %
Lymphs Abs: 1.8 10*3/uL (ref 0.7–4.0)
MCH: 30.2 pg (ref 26.0–34.0)
MCHC: 31.4 g/dL (ref 30.0–36.0)
MCV: 96.2 fL (ref 80.0–100.0)
Monocytes Absolute: 0.5 10*3/uL (ref 0.1–1.0)
Monocytes Relative: 9 %
Neutro Abs: 2.9 10*3/uL (ref 1.7–7.7)
Neutrophils Relative %: 53 %
Platelets: 197 10*3/uL (ref 150–400)
RBC: 4.17 MIL/uL (ref 3.87–5.11)
RDW: 14.6 % (ref 11.5–15.5)
WBC: 5.4 10*3/uL (ref 4.0–10.5)
nRBC: 0 % (ref 0.0–0.2)

## 2020-04-29 LAB — COMPREHENSIVE METABOLIC PANEL
ALT: 78 U/L — ABNORMAL HIGH (ref 0–44)
AST: 79 U/L — ABNORMAL HIGH (ref 15–41)
Albumin: 4.4 g/dL (ref 3.5–5.0)
Alkaline Phosphatase: 61 U/L (ref 38–126)
Anion gap: 9 (ref 5–15)
BUN: 13 mg/dL (ref 8–23)
CO2: 29 mmol/L (ref 22–32)
Calcium: 9.2 mg/dL (ref 8.9–10.3)
Chloride: 100 mmol/L (ref 98–111)
Creatinine, Ser: 1.25 mg/dL — ABNORMAL HIGH (ref 0.44–1.00)
GFR, Estimated: 47 mL/min — ABNORMAL LOW (ref >60)
Glucose, Bld: 98 mg/dL (ref 70–99)
Potassium: 4.7 mmol/L (ref 3.5–5.1)
Sodium: 138 mmol/L (ref 135–145)
Total Bilirubin: 0.8 mg/dL (ref 0.3–1.2)
Total Protein: 7.8 g/dL (ref 6.5–8.1)

## 2020-04-29 LAB — URINALYSIS, ROUTINE W REFLEX MICROSCOPIC
Bilirubin Urine: NEGATIVE
Glucose, UA: NEGATIVE mg/dL
Hgb urine dipstick: NEGATIVE
Ketones, ur: NEGATIVE mg/dL
Leukocytes,Ua: NEGATIVE
Nitrite: NEGATIVE
Protein, ur: NEGATIVE mg/dL
Specific Gravity, Urine: 1.003 — ABNORMAL LOW (ref 1.005–1.030)
pH: 6 (ref 5.0–8.0)

## 2020-04-29 LAB — TSH: TSH: 7.662 u[IU]/mL — ABNORMAL HIGH (ref 0.350–4.500)

## 2020-04-29 LAB — PROTIME-INR
INR: 3.2 — ABNORMAL HIGH (ref 0.8–1.2)
Prothrombin Time: 31.9 seconds — ABNORMAL HIGH (ref 11.4–15.2)

## 2020-04-29 LAB — AMMONIA: Ammonia: 29 umol/L (ref 9–35)

## 2020-04-29 NOTE — ED Triage Notes (Signed)
Pt from home. Has had multiple fall over the past couple days and has had increased confusion. Did hit her head. On blood thinners.

## 2020-04-29 NOTE — H&P (Signed)
History and Physical  [name] GNF:[id number] DOB: 06-Jul-1950 DOA: 04/29/2020  Referring physician: Davonna Belling, MD PCP: Mosie Lukes, MD  Patient coming from: Home  Chief Complaint: Fall  HPI: [name] is a 69 y.o. female with medical history significant for aortic insufficiency status post mechanical valve replacement on Coumadin, atrial fibrillation/flutter status post PPM, chronic combined systolic and diastolic CHF, noninsulin-dependent type II diabetes, migraine headaches, anemia, recurrent UTIs, hypertension, hyperlipidemia, gout, depression, bipolar disorde who presents to the emergency department due to recurrent multiple falls.  Patient states that she has fallen 6 times over the last week, she endorses hitting her head without injury.  Patient stated that she ambulates with a walker at baseline and that when she falls, she falls backwards with the walker.  She lives with daughter and her family and states that her daughter states that she has been more confused recently.  Recently history was obtained from ED physician and ED medical record.  Per report, patient was recently seen by her PCP started on Macrobid for UTI.  ED Course:  In the emergency department, she was hemodynamically stable.  Work-up in the ED showed normal CBC and BMP, TSH was elevated at 7.662, AST 17, ALT 71, ALT 16, urinalysis was unimpressive for UTI, INR 3.2.  Chest x-ray showed no acute cardiopulmonary disease. CT of head without contrast showed no acute intracranial abnormalities Hospitalist was asked to admit patient for further evaluation and management.  Review of Systems: Constitutional: Negative for chills and fever.  HENT: Negative for ear pain and sore throat.   Eyes: Negative for pain and visual disturbance.  Respiratory: Negative for cough, chest tightness and shortness of breath.   Cardiovascular: Negative for chest pain and palpitations.  Gastrointestinal: Negative for abdominal  pain and vomiting.  Endocrine: Negative for polyphagia and polyuria.  Genitourinary: Negative for decreased urine volume, dysuria, enuresis Musculoskeletal: Negative for arthralgias and back pain.  Skin: Negative for color change and rash.  Allergic/Immunologic: Negative for immunocompromised state.  Neurological: Negative for tremors, syncope, speech difficulty, weakness, light-headedness and headaches.  Hematological: Does not bruise/bleed easily.  All other systems reviewed and are negative  Past Medical History:  Diagnosis Date  . ANEMIA 05/28/2010  . AORTIC VALVE REPLACEMENT, HX OF 03/11/2010   Mechanical prosthesis  . BIPOLAR AFFECTIVE DISORDER 03/11/2010  . Depression   . Diverticulitis 12/18/2010  . FIBROIDS, UTERUS 03/11/2010  . Gout 09/04/2016  . Grave's disease 8-12  . Hyperlipidemia associated with type 2 diabetes mellitus ([place]) 06/05/2016  . Hypertension   . Low back pain 11/02/2017  . Migraine 06/05/2016  . Mixed hyperlipidemia 03/11/2010  . Obesity   . Skin cancer   . Syncope 08/22/2019  . UTI (lower urinary tract infection) 08/14/2011   Past Surgical History:  Procedure Laterality Date  . ABDOMINAL HYSTERECTOMY  ?1998   sb/l spo, total for fibroids/heavy bleeding  . ANKLE SURGERY Left 01/2018   hardware removal  . AORTIC VALVE REPLACEMENT  2011   Mechanical prosthesis, [employer]  . APPENDECTOMY     Required revision for EColi infection, required recurrent packing  . bowel obstruction     Requiring adhesions to be removed  . CHOLECYSTECTOMY    . COLONOSCOPY WITH PROPOFOL N/A 11/01/2015   Procedure: COLONOSCOPY WITH PROPOFOL;  Surgeon: Milus Banister, MD;  Location: [facility];  Service: Endoscopy;  Laterality: N/A;  . CYSTOCELE REPAIR N/A 10/16/2015   Procedure: ANTERIOR REPAIR (CYSTOCELE);  Surgeon: Donnamae Jude, MD;  Location: [facility];  Service: Gynecology;  Laterality: N/A;  . HERNIA REPAIR  08-16-10  . LEFT HEART CATHETERIZATION WITH CORONARY ANGIOGRAM N/A  12/05/2011   Procedure: LEFT HEART CATHETERIZATION WITH CORONARY ANGIOGRAM;  Surgeon: Sinclair Grooms, MD;  Location: [facility];  Service: Cardiovascular;  Laterality: N/A;  . PACEMAKER IMPLANT N/A 01/09/2020   Procedure: PACEMAKER IMPLANT;  Surgeon: Evans Lance, MD;  Location: [facility];  Service: Cardiovascular;  Laterality: N/A;  . TOTAL KNEE ARTHROPLASTY Right 02/24/2013   Procedure: RIGHT TOTAL KNEE ARTHROPLASTY;  Surgeon: Johnn Hai, MD;  Location: [facility];  Service: Orthopedics;  Laterality: Right;  . TUBAL LIGATION    . varicose vein surgery b/l legs      Social History:  reports that she quit smoking about 29 years ago. She has never used smokeless tobacco. She reports current alcohol use. She reports that she does not use drugs.   Allergies  Allergen Reactions  . Mucinex [Guaifenesin Er] Other (See Comments)    Severe headaches   . Keflex [Cephalexin] Other (See Comments)    Headaches and dizziness  . Sulfa Antibiotics Other (See Comments)    headaches  . Sulfonamide Derivatives Other (See Comments)    Headaches     Family History  Problem Relation Age of Onset  . Scoliosis Mother   . Arthritis Mother        Rheumatoid  . Aneurysm Mother        heart  . Alcohol abuse Father   . Depression Sister   . Depression Brother   . Alcohol abuse Brother   . Cancer Maternal Grandmother        colon  . Diabetes Maternal Grandfather   . Heart disease Maternal Grandfather   . Alcohol abuse Maternal Grandfather   . Depression Paternal Grandmother   . Diabetes Paternal Grandmother   . Depression Paternal Grandfather   . Diabetes Paternal Grandfather   . Depression Sister   . Alcohol abuse Brother   . Depression Brother   . Heart disease Brother     Prior to Admission medications   Medication Sig Start Date End Date Taking? Authorizing Provider  acetaminophen (TYLENOL) 650 MG CR tablet Take 1,300 mg by mouth every 8 (eight) hours as needed for pain.    Yes [provider]  amiodarone (PACERONE) 200 MG tablet TAKE 1 TABLET BY MOUTH TWICE A DAY Patient taking differently: Take 200 mg by mouth 2 (two) times daily.  01/19/20  Yes Evans Lance, MD  aspirin EC 81 MG tablet Take 81 mg by mouth daily.   Yes [provider]  butalbital-acetaminophen-caffeine (FIORICET) 50-325-40 MG tablet Take 1 tablet by mouth as needed for migraine. 03/08/20  Yes [provider]  Cholecalciferol (VITAMIN D3) 1.25 MG (50000 UT) TABS Take 1 tablet by mouth daily.   Yes [provider]  furosemide (LASIX) 40 MG tablet Take 1 tablet (40 mg total) by mouth daily as needed for edema. 03/31/20  Yes Geradine Girt, DO  lamoTRIgine (LAMICTAL) 100 MG tablet Take 100 mg by mouth in the morning and 200 mg by mouth at bedtime Patient taking differently: Take 100-200 mg by mouth See admin instructions. Take 100 mg by mouth in the morning and 200 mg by mouth at bedtime 10/11/19  Yes Thayer Headings, PMHNP  metoprolol succinate (TOPROL XL) 25 MG 24 hr tablet Take 0.5 tablets (12.5 mg total) by mouth daily. 03/31/20  Yes Eulogio Bear  U, DO  QUEtiapine (SEROQUEL XR) 300 MG 24 hr tablet Take 2 tablets (600 mg total) by mouth every evening. 10/11/19 04/29/20 Yes Thayer Headings, PMHNP  SUMAtriptan (IMITREX) 50 MG tablet TAKE ONE TABLET EVERY 2 HOURS AS NEEDED FOR MIGRAINE. MAY REPEAT IN 2 HOURS IF HEADACHE PERSISTS OR RECURS. (MAX OF 2 TABLETS IN 24 HOURS) Patient taking differently: Take 50 mg by mouth every 2 (two) hours as needed for migraine.  12/01/17  Yes Mosie Lukes, MD  venlafaxine XR (EFFEXOR XR) 75 MG 24 hr capsule Take 1 capsule (75 mg total) by mouth daily with breakfast. 10/11/19 04/29/20 Yes Thayer Headings, PMHNP  vitamin B-12 1000 MCG tablet Take 1 tablet (1,000 mcg total) by mouth daily. 04/01/20  Yes Eulogio Bear U, DO  warfarin (COUMADIN) 5 MG tablet Take as directed by [facility]. Patient taking differently: Take 2.5-5 mg by mouth  See admin instructions. Take 2.5mg  on Mondays and Fridays, and 5mg  on all other days.Take every night. 09/15/19  Yes Belva Crome, MD  glucose blood [facility] VERIO) test strip Check blood sugars once daily Dx: E11.9 10/12/18   Mosie Lukes, MD  Lancets [facility] ULTRASOFT) lancets Use as instructed to test blood sugars once daily Dx. E11.65 08/18/18   Shamleffer, Melanie Crazier, MD  nitrofurantoin, macrocrystal-monohydrate, (MACROBID) 100 MG capsule Take 1 capsule (100 mg total) by mouth 2 (two) times daily for 7 days. Patient not taking: Reported on 04/29/2020 04/26/20 05/03/20  Shelda Pal, DO  ONE TOUCH LANCETS MISC Test once daily to check blood sugar. DX E11.9 04/26/15   Mosie Lukes, MD  methimazole (TAPAZOLE) 5 MG tablet Take 1 tablet (5 mg total) by mouth 3 (three) times daily. 06/24/11 08/31/19  Renato Shin, MD    Physical Exam: BP (!) 93/55 (BP Location: Right Arm)   Pulse (!) 59   Temp 97.7 F (36.5 C)   Resp 17   Ht 5\' 8"  (1.727 m)   Wt 92.1 kg   SpO2 95%   BMI 30.87 kg/m   . General: 69 y.o. year-old female well developed well nourished in no acute distress.  Alert and oriented x3. Marland Kitchen HEENT: NCAT, EOMI . Neck: Supple, trachea medial . Cardiovascular: Regular rate and rhythm with no rubs or gallops.  No thyromegaly or JVD noted.  No lower extremity edema. 2/4 pulses in all 4 extremities. Marland Kitchen Respiratory: Clear to auscultation with no wheezes or rales. Good inspiratory effort. . Abdomen: Soft nontender nondistended with normal bowel sounds x4 quadrants. . Muskuloskeletal: No cyanosis, clubbing or edema noted bilaterally . Neuro: CN II-XII intact, strength, sensation, reflexes.  No obvious confusion at bedside . Skin: No ulcerative lesions noted or rashes . Psychiatry: Judgement and insight appear normal. Mood is appropriate for condition and setting          Labs on Admission:  Basic Metabolic Panel: Recent Labs  Lab 04/29/20 2007  NA 138  K 4.7  CL 100   CO2 29  GLUCOSE 98  BUN 13  CREATININE 1.25*  CALCIUM 9.2   Liver Function Tests: Recent Labs  Lab 04/29/20 2007  AST 79*  ALT 78*  ALKPHOS 61  BILITOT 0.8  PROT 7.8  ALBUMIN 4.4   No results for input(s): LIPASE, AMYLASE in the last 168 hours. Recent Labs  Lab 04/29/20 2007  AMMONIA 29   CBC: Recent Labs  Lab 04/29/20 2007  WBC 5.4  NEUTROABS 2.9  HGB 12.6  HCT 40.1  MCV 96.2  PLT 197   Cardiac Enzymes: No results for input(s): CKTOTAL, CKMB, CKMBINDEX, TROPONINI in the last 168 hours.  BNP (last 3 results) No results for input(s): BNP in the last 8760 hours.  ProBNP (last 3 results) No results for input(s): PROBNP in the last 8760 hours.  CBG: No results for input(s): GLUCAP in the last 168 hours.  Radiological Exams on Admission: CT Head Wo Contrast  Result Date: 04/29/2020 CLINICAL DATA:  Headache. Fall at home. Multiple falls last several days. EXAM: CT HEAD WITHOUT CONTRAST TECHNIQUE: Contiguous axial images were obtained from the base of the skull through the vertex without intravenous contrast. COMPARISON:  03/27/2020 FINDINGS: Brain: No evidence of acute infarction, hemorrhage, hydrocephalus, extra-axial collection or mass lesion/mass effect. Mild scatter areas of white matter hypoattenuation are noted, stable consistent with chronic microvascular ischemic change. Vascular: No hyperdense vessel or unexpected calcification. Skull: Normal. Negative for fracture or focal lesion. Sinuses/Orbits: Visualized globes and orbits are unremarkable. Visualized sinuses are clear. Other: None. IMPRESSION: 1. No acute intracranial abnormalities. 2. No change from the recent prior study. Electronically Signed   By: Lajean Manes M.D.   On: 04/29/2020 19:59   DG Chest Portable 1 View  Result Date: 04/29/2020 CLINICAL DATA:  Headache. Multiple falls over the last several days. Increased confusion. On blood thinners. EXAM: PORTABLE CHEST 1 VIEW COMPARISON:  03/28/2020 and  older studies. FINDINGS: Stable changes from cardiac surgery and aortic valve replacement. Cardiac silhouette is normal in size. No mediastinal or hilar masses. Stable scarring at the lung apices.  Lungs otherwise clear. Surgical vascular clips project over the right axilla, stable. Stable left anterior chest wall sequential pacemaker. Skeletal structures are grossly intact. IMPRESSION: No acute cardiopulmonary disease. Electronically Signed   By: Lajean Manes M.D.   On: 04/29/2020 20:00    EKG: I independently viewed the EKG done and my findings are as followed: EKG showed ventricular pacing with QTc 561ms  Assessment/Plan Present on Admission: . Altered mental status . Prolonged QT interval . Atrial fibrillation (HCC)  Active Problems:   Hx of mechanical aortic valve replacement   Atrial fibrillation (HCC)   Prolonged QT interval   Recurrent falls   Altered mental status   Supratherapeutic INR   Elevated liver enzymes   Elevated TSH  Reported altered mental status Patient's mental status appears to have improved at time of exam Avoid polypharmacy Continue fall precaution and Neurochecks  Recurrent falls Continue fall precaution and Neurochecks Continue PT/OT eval and treat  Prolonged QTc Avoid QT prolonging drugs Magnesium level will be checked Continue telemetry  Supratherapeutic INR INR 3.1, warfarin will be held at this time Continue to monitor INR  Elevated TSH TSH 7.662, free T4 will be checked  Elevated liver enzymes AST 78, ALT 79, ALP 61 Continue to monitor liver enzymes    History of aortic insufficiency status post mechanical valve replacement INR was 3.1 Coumadin will be held at this time   Atrial fibrillation/flutter Stable.  Has a pacemaker. Continue current.   Coumadin currently held due to supratherapeutic INR Amiodarone temporarily held due to prolonged QTc  DVT prophylaxis: SCDs  Code Status: Full code  Family Communication: None at  bedside  Disposition Plan:  Patient is from:                        home Anticipated DC to:  SNF or family members home Anticipated DC date:               2-3 days Anticipated DC barriers:           Patient is unstable to be discharged at this time due to recurrent falls and reported altered mental status which require further evaluation and management  Consults called: None  Admission status: Inpatient  Bernadette Hoit MD [facility]  04/30/2020, 5:00 AM

## 2020-04-29 NOTE — ED Provider Notes (Signed)
[facility] EMERGENCY DEPARTMENT Provider Note   CSN: [id number] Arrival date & time: 04/29/20  1745     History Chief Complaint  Patient presents with   Lytle Michaels    LONNETTA KNISKERN is a 69 y.o. female.  HPI Level 5 caveat due to altered mental status. Patient has had 6 falls over the last week.  Has hit her head.  Normally walks with a walker but now has been unable to do that.  More confusion.  Had admission to the hospital around a month ago for confusion thought to be due to UTI and polypharmacy.  Patient's daughter who most of the history comes from states that she is improved on the medicines.  States that patient is gotten worse.  Patient cannot provide too much history.  Got seen by PCP who started on Macrobid for UTI.  Patient reported not having UTI symptoms but normally does not have symptoms when she gets infection.  Reviewing labs urine culture was negative.  Does have a little dull headache.    Past Medical History:  Diagnosis Date   ANEMIA 05/28/2010   AORTIC VALVE REPLACEMENT, HX OF 03/11/2010   Mechanical prosthesis   BIPOLAR AFFECTIVE DISORDER 03/11/2010   Depression    Diverticulitis 12/18/2010   FIBROIDS, UTERUS 03/11/2010   Gout 09/04/2016   Grave's disease 8-12   Hyperlipidemia associated with type 2 diabetes mellitus ([place]) 06/05/2016   Hypertension    Low back pain 11/02/2017   Migraine 06/05/2016   Mixed hyperlipidemia 03/11/2010   Obesity    Skin cancer    Syncope 08/22/2019   UTI (lower urinary tract infection) 08/14/2011    Patient Active Problem List   Diagnosis Date Noted   Tachycardia-bradycardia syndrome ([place]) 04/25/2020   Pacemaker 04/25/2020   Recurrent falls 03/27/2020   Bradycardia 08/23/2019   Prolonged QT interval    Near syncope 08/21/2019   GAD (generalized anxiety disorder) 04/25/2018   Ankle pain 02/04/2018   Nocturia 11/02/2017   Low back pain 11/02/2017   Type 2 diabetes mellitus with diabetic  polyneuropathy, without long-term current use of insulin ([place]) 11/02/2017   Hyperlipidemia associated with type 2 diabetes mellitus ([place]) 06/05/2016   Migraine 06/05/2016   Encounter for therapeutic drug monitoring 09/13/2013   Essential hypertension, benign 04/05/2013   Atrial fibrillation ([place]) 03/18/2013   Long term current use of anticoagulant therapy 03/11/2013   UTI (urinary tract infection) 02/10/2013   Pernicious anemia 02/21/2011   Renal insufficiency 12/30/2010   Hyperthyroidism 12/18/2010   Anemia 05/28/2010   Bipolar disorder ([place]) 03/11/2010   DIVERTICULOSIS OF COLON 03/11/2010   Constipation 03/11/2010   Hx of mechanical aortic valve replacement 03/11/2010    Past Surgical History:  Procedure Laterality Date   ABDOMINAL HYSTERECTOMY  ?1998   sb/l spo, total for fibroids/heavy bleeding   ANKLE SURGERY Left 01/2018   hardware removal   AORTIC VALVE REPLACEMENT  2011   Mechanical prosthesis, [employer]   APPENDECTOMY     Required revision for EColi infection, required recurrent packing   bowel obstruction     Requiring adhesions to be removed   CHOLECYSTECTOMY     COLONOSCOPY WITH PROPOFOL N/A 11/01/2015   Procedure: COLONOSCOPY WITH PROPOFOL;  Surgeon: Milus Banister, MD;  Location: [facility];  Service: Endoscopy;  Laterality: N/A;   CYSTOCELE REPAIR N/A 10/16/2015   Procedure: ANTERIOR REPAIR (CYSTOCELE);  Surgeon: Donnamae Jude, MD;  Location: [facility];  Service: Gynecology;  Laterality: N/A;   HERNIA REPAIR  08-16-10   LEFT HEART CATHETERIZATION WITH CORONARY ANGIOGRAM N/A 12/05/2011   Procedure: LEFT HEART CATHETERIZATION WITH CORONARY ANGIOGRAM;  Surgeon: Sinclair Grooms, MD;  Location: [facility] C[facility] LAB;  Service: Cardiovascular;  Laterality: N/A;   PACEMAKER IMPLANT N/A 01/09/2020   Procedure: PACEMAKER IMPLANT;  Surgeon: Evans Lance, MD;  Location: [facility];  Service: Cardiovascular;  Laterality: N/A;   TOTAL KNEE ARTHROPLASTY  Right 02/24/2013   Procedure: RIGHT TOTAL KNEE ARTHROPLASTY;  Surgeon: Johnn Hai, MD;  Location: [facility];  Service: Orthopedics;  Laterality: Right;   TUBAL LIGATION     varicose vein surgery b/l legs       OB History    Gravida  2   Para  2   Term  2   Preterm      AB      Living  2     SAB      TAB      Ectopic      Multiple      Live Births              Family History  Problem Relation Age of Onset   Scoliosis Mother    Arthritis Mother        Rheumatoid   Aneurysm Mother        heart   Alcohol abuse Father    Depression Sister    Depression Brother    Alcohol abuse Brother    Cancer Maternal Grandmother        colon   Diabetes Maternal Grandfather    Heart disease Maternal Grandfather    Alcohol abuse Maternal Grandfather    Depression Paternal Grandmother    Diabetes Paternal Grandmother    Depression Paternal Grandfather    Diabetes Paternal Grandfather    Depression Sister    Alcohol abuse Brother    Depression Brother    Heart disease Brother     Social History   Tobacco Use   Smoking status: Former Smoker    Quit date: 05/26/1990    Years since quitting: 29.9   Smokeless tobacco: Never Used  Vaping Use   Vaping Use: Never used  Substance Use Topics   Alcohol use: Yes    Alcohol/week: 0.0 standard drinks    Comment: rare   Drug use: No    Home Medications Prior to Admission medications   Medication Sig Start Date End Date Taking? Authorizing Provider  acetaminophen (TYLENOL) 650 MG CR tablet Take 1,300 mg by mouth every 8 (eight) hours as needed for pain.   Yes [provider]  amiodarone (PACERONE) 200 MG tablet TAKE 1 TABLET BY MOUTH TWICE A DAY Patient taking differently: Take 200 mg by mouth 2 (two) times daily.  01/19/20  Yes Evans Lance, MD  aspirin EC 81 MG tablet Take 81 mg by mouth daily.   Yes [provider]  butalbital-acetaminophen-caffeine (FIORICET) 50-325-40 MG  tablet Take 1 tablet by mouth as needed for migraine. 03/08/20  Yes [provider]  Cholecalciferol (VITAMIN D3) 1.25 MG (50000 UT) TABS Take 1 tablet by mouth daily.   Yes [provider]  furosemide (LASIX) 40 MG tablet Take 1 tablet (40 mg total) by mouth daily as needed for edema. 03/31/20  Yes Geradine Girt, DO  lamoTRIgine (LAMICTAL) 100 MG tablet Take 100 mg by mouth in the morning and 200 mg by mouth at bedtime Patient taking differently: Take 100-200 mg by mouth See admin  instructions. Take 100 mg by mouth in the morning and 200 mg by mouth at bedtime 10/11/19  Yes Thayer Headings, PMHNP  metoprolol succinate (TOPROL XL) 25 MG 24 hr tablet Take 0.5 tablets (12.5 mg total) by mouth daily. 03/31/20  Yes Eulogio Bear U, DO  QUEtiapine (SEROQUEL XR) 300 MG 24 hr tablet Take 2 tablets (600 mg total) by mouth every evening. 10/11/19 04/29/20 Yes Thayer Headings, PMHNP  SUMAtriptan (IMITREX) 50 MG tablet TAKE ONE TABLET EVERY 2 HOURS AS NEEDED FOR MIGRAINE. MAY REPEAT IN 2 HOURS IF HEADACHE PERSISTS OR RECURS. (MAX OF 2 TABLETS IN 24 HOURS) Patient taking differently: Take 50 mg by mouth every 2 (two) hours as needed for migraine.  12/01/17  Yes Mosie Lukes, MD  venlafaxine XR (EFFEXOR XR) 75 MG 24 hr capsule Take 1 capsule (75 mg total) by mouth daily with breakfast. 10/11/19 04/29/20 Yes Thayer Headings, PMHNP  vitamin B-12 1000 MCG tablet Take 1 tablet (1,000 mcg total) by mouth daily. 04/01/20  Yes Eulogio Bear U, DO  warfarin (COUMADIN) 5 MG tablet Take as directed by [facility]. Patient taking differently: Take 2.5-5 mg by mouth See admin instructions. Take 2.5mg  on Mondays and Fridays, and 5mg  on all other days.Take every night. 09/15/19  Yes Belva Crome, MD  glucose blood [facility] VERIO) test strip Check blood sugars once daily Dx: E11.9 10/12/18   Mosie Lukes, MD  Lancets [facility] ULTRASOFT) lancets Use as instructed to test blood sugars once daily Dx. E11.65  08/18/18   Shamleffer, Melanie Crazier, MD  nitrofurantoin, macrocrystal-monohydrate, (MACROBID) 100 MG capsule Take 1 capsule (100 mg total) by mouth 2 (two) times daily for 7 days. Patient not taking: Reported on 04/29/2020 04/26/20 05/03/20  Shelda Pal, DO  ONE TOUCH LANCETS MISC Test once daily to check blood sugar. DX E11.9 04/26/15   Mosie Lukes, MD  methimazole (TAPAZOLE) 5 MG tablet Take 1 tablet (5 mg total) by mouth 3 (three) times daily. 06/24/11 08/31/19  Renato Shin, MD    Allergies    Mucinex [guaifenesin er], Keflex [cephalexin], Sulfa antibiotics, and Sulfonamide derivatives  Review of Systems   Review of Systems  Unable to perform ROS: Mental status change    Physical Exam Updated Vital Signs BP 104/61 (BP Location: Right Arm)    Pulse (!) 58    Temp 97.7 F (36.5 C)    Resp 14    Ht 5\' 8"  (1.727 m)    Wt 92.1 kg    SpO2 94%    BMI 30.87 kg/m   Physical Exam Vitals and nursing note reviewed.  HENT:     Head: Atraumatic.     Mouth/Throat:     Mouth: Mucous membranes are moist.  Eyes:     General: No scleral icterus.    Extraocular Movements: Extraocular movements intact.     Pupils: Pupils are equal, round, and reactive to light.  Cardiovascular:     Rate and Rhythm: Regular rhythm.  Pulmonary:     Breath sounds: No wheezing or rhonchi.  Abdominal:     Tenderness: There is no abdominal tenderness.  Musculoskeletal:        General: No tenderness.     Cervical back: Neck supple.  Skin:    General: Skin is warm.     Capillary Refill: Capillary refill takes less than 2 seconds.  Neurological:     Mental Status: She is alert.     Comments: Awake and pleasant but slow  to answer and some confusion.     ED Results / Procedures / Treatments   Labs (all labs ordered are listed, but only abnormal results are displayed) Labs Reviewed  COMPREHENSIVE METABOLIC PANEL - Abnormal; Notable for the following components:      Result Value   Creatinine,  Ser 1.25 (*)    AST 79 (*)    ALT 78 (*)    GFR, Estimated 47 (*)    All other components within normal limits  URINALYSIS, ROUTINE W REFLEX MICROSCOPIC - Abnormal; Notable for the following components:   Color, Urine STRAW (*)    Specific Gravity, Urine 1.003 (*)    All other components within normal limits  PROTIME-INR - Abnormal; Notable for the following components:   Prothrombin Time 31.9 (*)    INR 3.2 (*)    All other components within normal limits  TSH - Abnormal; Notable for the following components:   TSH 7.662 (*)    All other components within normal limits  AMMONIA  CBC WITH DIFFERENTIAL/PLATELET    EKG EKG Interpretation  Date/Time:  Sunday April 29 2020 17:53:17 EST Ventricular Rate:  60 PR Interval:    QRS Duration: 176 QT Interval:  519 QTC Calculation: 519 R Axis:   -37 Text Interpretation: VENTRICULAR PACING Confirmed by Davonna Belling [phone]) on 04/29/2020 10:13:37 PM   Radiology CT Head Wo Contrast  Result Date: 04/29/2020 CLINICAL DATA:  Headache. Fall at home. Multiple falls last several days. EXAM: CT HEAD WITHOUT CONTRAST TECHNIQUE: Contiguous axial images were obtained from the base of the skull through the vertex without intravenous contrast. COMPARISON:  03/27/2020 FINDINGS: Brain: No evidence of acute infarction, hemorrhage, hydrocephalus, extra-axial collection or mass lesion/mass effect. Mild scatter areas of white matter hypoattenuation are noted, stable consistent with chronic microvascular ischemic change. Vascular: No hyperdense vessel or unexpected calcification. Skull: Normal. Negative for fracture or focal lesion. Sinuses/Orbits: Visualized globes and orbits are unremarkable. Visualized sinuses are clear. Other: None. IMPRESSION: 1. No acute intracranial abnormalities. 2. No change from the recent prior study. Electronically Signed   By: Lajean Manes M.D.   On: 04/29/2020 19:59   DG Chest Portable 1 View  Result Date:  04/29/2020 CLINICAL DATA:  Headache. Multiple falls over the last several days. Increased confusion. On blood thinners. EXAM: PORTABLE CHEST 1 VIEW COMPARISON:  03/28/2020 and older studies. FINDINGS: Stable changes from cardiac surgery and aortic valve replacement. Cardiac silhouette is normal in size. No mediastinal or hilar masses. Stable scarring at the lung apices.  Lungs otherwise clear. Surgical vascular clips project over the right axilla, stable. Stable left anterior chest wall sequential pacemaker. Skeletal structures are grossly intact. IMPRESSION: No acute cardiopulmonary disease. Electronically Signed   By: Lajean Manes M.D.   On: 04/29/2020 20:00    Procedures Procedures (including critical care time)  Medications Ordered in ED Medications - No data to display  ED Course  I have reviewed the triage vital signs and the nursing notes.  Pertinent labs & imaging results that were available during my care of the patient were reviewed by me and considered in my medical decision making (see chart for details).    MDM Rules/Calculators/A&P                          Patient presents with recurrent falls on anticoagulation.  Has hit head.  No longer able to ambulate with a walker like she was before.  Work-up overall reassuring.  Urine does not show infection and reviewing recent cultures did not show infection then.  However does have hypothyroidism.  However patient has been falling pretty much daily.  Mental status is also decreased.  Recently had changes in medications due to presumed polypharmacy.  Worsening recently.  I feels patient benefit from admission to the hospital for further evaluation of the mental status change.  Also potentially could need more help at home or further placement.  Will discuss with hospitalist. Final Clinical Impression(s) / ED Diagnoses Final diagnoses:  Encephalopathy  Multiple falls  Hypothyroidism, unspecified type    Rx / DC Orders ED Discharge  Orders    None       Davonna Belling, MD 04/29/20 2240

## 2020-04-29 NOTE — ED Notes (Signed)
Dr. Alvino Chapel to bedside

## 2020-04-30 DIAGNOSIS — R791 Abnormal coagulation profile: Secondary | ICD-10-CM

## 2020-04-30 DIAGNOSIS — R748 Abnormal levels of other serum enzymes: Secondary | ICD-10-CM

## 2020-04-30 DIAGNOSIS — R7989 Other specified abnormal findings of blood chemistry: Secondary | ICD-10-CM

## 2020-04-30 HISTORY — DX: Other specified abnormal findings of blood chemistry: R79.89

## 2020-04-30 HISTORY — DX: Abnormal coagulation profile: R79.1

## 2020-04-30 LAB — CBC
HCT: 38.3 % (ref 36.0–46.0)
Hemoglobin: 12.1 g/dL (ref 12.0–15.0)
MCH: 30.2 pg (ref 26.0–34.0)
MCHC: 31.6 g/dL (ref 30.0–36.0)
MCV: 95.5 fL (ref 80.0–100.0)
Platelets: 210 10*3/uL (ref 150–400)
RBC: 4.01 MIL/uL (ref 3.87–5.11)
RDW: 14.5 % (ref 11.5–15.5)
WBC: 5.5 10*3/uL (ref 4.0–10.5)
nRBC: 0 % (ref 0.0–0.2)

## 2020-04-30 LAB — COMPREHENSIVE METABOLIC PANEL
ALT: 74 U/L — ABNORMAL HIGH (ref 0–44)
AST: 73 U/L — ABNORMAL HIGH (ref 15–41)
Albumin: 4 g/dL (ref 3.5–5.0)
Alkaline Phosphatase: 58 U/L (ref 38–126)
Anion gap: 11 (ref 5–15)
BUN: 13 mg/dL (ref 8–23)
CO2: 28 mmol/L (ref 22–32)
Calcium: 9.3 mg/dL (ref 8.9–10.3)
Chloride: 101 mmol/L (ref 98–111)
Creatinine, Ser: 1.2 mg/dL — ABNORMAL HIGH (ref 0.44–1.00)
GFR, Estimated: 49 mL/min — ABNORMAL LOW (ref >60)
Glucose, Bld: 97 mg/dL (ref 70–99)
Potassium: 3.7 mmol/L (ref 3.5–5.1)
Sodium: 140 mmol/L (ref 135–145)
Total Bilirubin: 0.8 mg/dL (ref 0.3–1.2)
Total Protein: 7.2 g/dL (ref 6.5–8.1)

## 2020-04-30 LAB — PROTIME-INR
INR: 3.3 — ABNORMAL HIGH (ref 0.8–1.2)
Prothrombin Time: 32.7 seconds — ABNORMAL HIGH (ref 11.4–15.2)

## 2020-04-30 LAB — MRSA PCR SCREENING: MRSA by PCR: NEGATIVE

## 2020-04-30 LAB — APTT: aPTT: 42 seconds — ABNORMAL HIGH (ref 24–36)

## 2020-04-30 LAB — RESP PANEL BY RT-PCR (FLU A&B, COVID) ARPGX2
Influenza A by PCR: NEGATIVE
Influenza A by PCR: NEGATIVE
Influenza B by PCR: NEGATIVE
Influenza B by PCR: NEGATIVE
SARS Coronavirus 2 by RT PCR: POSITIVE — AB
SARS Coronavirus 2 by RT PCR: NEGATIVE

## 2020-04-30 LAB — MAGNESIUM: Magnesium: 2.2 mg/dL (ref 1.7–2.4)

## 2020-04-30 LAB — T4, FREE: Free T4: 0.94 ng/dL (ref 0.61–1.12)

## 2020-04-30 LAB — PHOSPHORUS: Phosphorus: 3.8 mg/dL (ref 2.5–4.6)

## 2020-04-30 MED ORDER — LEVOTHYROXINE SODIUM 25 MCG PO TABS
12.5000 ug | ORAL_TABLET | Freq: Every day | ORAL | Status: DC
  Administered 2020-05-01 – 2020-05-03 (×3): 12.5 ug via ORAL
  Filled 2020-04-30 (×3): qty 1

## 2020-04-30 MED ORDER — METOPROLOL SUCCINATE ER 25 MG PO TB24
12.5000 mg | ORAL_TABLET | Freq: Every day | ORAL | Status: DC
  Administered 2020-04-30 – 2020-05-03 (×4): 12.5 mg via ORAL
  Filled 2020-04-30 (×4): qty 1

## 2020-04-30 MED ORDER — WARFARIN - PHARMACIST DOSING INPATIENT: Freq: Every day | Status: DC

## 2020-04-30 MED ORDER — WARFARIN SODIUM 5 MG PO TABS
6.0000 mg | ORAL_TABLET | Freq: Once | ORAL | Status: AC
  Administered 2020-04-30: 6 mg via ORAL
  Filled 2020-04-30: qty 1

## 2020-04-30 MED ORDER — CHLORHEXIDINE GLUCONATE CLOTH 2 % EX PADS
6.0000 | MEDICATED_PAD | Freq: Every day | CUTANEOUS | Status: DC
  Administered 2020-04-30 – 2020-05-03 (×4): 6 via TOPICAL

## 2020-04-30 MED ORDER — HYDRALAZINE HCL 20 MG/ML IJ SOLN
10.0000 mg | INTRAMUSCULAR | Status: DC | PRN
  Filled 2020-04-30: qty 1

## 2020-04-30 MED ORDER — VENLAFAXINE HCL ER 75 MG PO CP24
75.0000 mg | ORAL_CAPSULE | Freq: Every day | ORAL | Status: DC
  Administered 2020-05-01 – 2020-05-03 (×3): 75 mg via ORAL
  Filled 2020-04-30 (×3): qty 1

## 2020-04-30 MED ORDER — QUETIAPINE FUMARATE ER 300 MG PO TB24
600.0000 mg | ORAL_TABLET | Freq: Every day | ORAL | Status: DC
  Administered 2020-04-30 – 2020-05-02 (×3): 600 mg via ORAL
  Filled 2020-04-30 (×4): qty 2

## 2020-04-30 NOTE — Progress Notes (Signed)
PROGRESS NOTE    [name]  CBJ:[id number] DOB: 01/10/1951 DOA: 04/29/2020 PCP: Mosie Lukes, MD   Brief Narrative:  Per HPI: [name] is a 69 y.o. female with medical history significant for aortic insufficiency status post mechanical valve replacement on Coumadin, atrial fibrillation/flutter status post PPM, chronic combined systolic and diastolic CHF, noninsulin-dependent type II diabetes, migraine headaches, anemia, recurrent UTIs, hypertension, hyperlipidemia, gout, depression, bipolar disorde who presents to the emergency department due to recurrent multiple falls.  Patient states that she has fallen 6 times over the last week, she endorses hitting her head without injury.  Patient stated that she ambulates with a walker at baseline and that when she falls, she falls backwards with the walker.  She lives with daughter and her family and states that her daughter states that she has been more confused recently.  Recently history was obtained from ED physician and ED medical record.  Per report, patient was recently seen by her PCP started on Macrobid for UTI.  ED Course:  In the emergency department, she was hemodynamically stable.  Work-up in the ED showed normal CBC and BMP, TSH was elevated at 7.662, AST 17, ALT 71, ALT 16, urinalysis was unimpressive for UTI, INR 3.2.  Chest x-ray showed no acute cardiopulmonary disease. CT of head without contrast showed no acute intracranial abnormalities Hospitalist was asked to admit patient for further evaluation and management.  -Patient has been admitted for evaluation of confusion as well as weakness with repeated falls at home.  Initial Covid test noted to be positive, but then repeat was negative.  CT head negative.  Noted to have some elevated TSH and symptoms of hypothyroidism.  Assessment & Plan:   Active Problems:   Hx of mechanical aortic valve replacement   Atrial fibrillation (HCC)   Prolonged QT interval   Recurrent  falls   Altered mental status   Supratherapeutic INR   Elevated liver enzymes   Elevated TSH   -Reported altered mental status with weakness and recurrent falls Patient's mental status appears to have improved at time of exam Avoid polypharmacy Continue fall precaution and Neurochecks Continue fall precaution and Neurochecks PT/OT evaluation pending with likely need for SNF/rehab  -Prolonged QTc Avoid QT prolonging drugs Magnesium level 2.2 Continue telemetry  -Elevated TSH with likely hypothyroidism TSH 7.662 Free T4 within normal limits, but will start low-dose levothyroxine  -Elevated liver enzymes AST 78, ALT 79, ALP 61 Continue to monitor liver enzymes  Currently stable, follow-up in a.m.  -History of aortic insufficiency status post mechanical valve replacement INR was 3.1 with goal 2.5-3.5 Okay to resume Coumadin  -Atrial fibrillation/flutter Stable. Has a pacemaker. Continue current.  Coumadin  to be reinitiated Amiodarone temporarily held due to prolonged QTc   DVT prophylaxis: SCD/warfarin Code Status: Full code Family Communication: Discussed with daughter on phone Disposition Plan:  Status is: Inpatient  Remains inpatient appropriate because:IV treatments appropriate due to intensity of illness or inability to take PO and Inpatient level of care appropriate due to severity of illness   Dispo: The patient is from: Home              Anticipated d/c is to: SNF              Anticipated d/c date is: 1 day              Patient currently is medically stable to d/c.  Patient requires PT evaluation today determine safety on discharge with  proper placement likely to SNF/rehab.   Consultants:   None  Procedures:   See below  Antimicrobials:   None   Subjective: Patient seen and evaluated today with no new acute complaints or concerns. No acute concerns or events noted overnight.  He does not appear confused.  Objective: Vitals:    04/30/20 1200 04/30/20 1300 04/30/20 1400 04/30/20 1500  BP: 118/78 96/66 132/84 (!) 157/88  Pulse: 74 64 66 70  Resp: 17 19 17 14   Temp:      SpO2: 97% 98% 99% 97%  Weight:      Height:       No intake or output data in the 24 hours ending 04/30/20 1631 Filed Weights   04/29/20 1750  Weight: 92.1 kg    Examination:  General exam: Appears calm and comfortable  Respiratory system: Clear to auscultation. Respiratory effort normal. Cardiovascular system: S1 & S2 heard, RRR.  Gastrointestinal system: Abdomen is nondistended, soft and nontender.  Central nervous system: Alert and oriented. No focal neurological deficits. Extremities: Symmetric 5 x 5 power.  No edema noted. Skin: No rashes, lesions or ulcers Psychiatry: Judgement and insight appear normal. Mood & affect appropriate.     Data Reviewed: I have personally reviewed following labs and imaging studies  CBC: Recent Labs  Lab 04/29/20 2007 04/30/20 0616  WBC 5.4 5.5  NEUTROABS 2.9  --   HGB 12.6 12.1  HCT 40.1 38.3  MCV 96.2 95.5  PLT 197 010   Basic Metabolic Panel: Recent Labs  Lab 04/29/20 2007 04/30/20 0616  NA 138 140  K 4.7 3.7  CL 100 101  CO2 29 28  GLUCOSE 98 97  BUN 13 13  CREATININE 1.25* 1.20*  CALCIUM 9.2 9.3  MG  --  2.2  PHOS  --  3.8   GFR: Estimated Creatinine Clearance: 52.5 mL/min (A) (by C-G formula based on SCr of 1.2 mg/dL (H)). Liver Function Tests: Recent Labs  Lab 04/29/20 2007 04/30/20 0616  AST 79* 73*  ALT 78* 74*  ALKPHOS 61 58  BILITOT 0.8 0.8  PROT 7.8 7.2  ALBUMIN 4.4 4.0   No results for input(s): LIPASE, AMYLASE in the last 168 hours. Recent Labs  Lab 04/29/20 2007  AMMONIA 29   Coagulation Profile: Recent Labs  Lab 04/25/20 1506 04/29/20 2007 04/30/20 0616  INR 2.4 3.2* 3.3*   Cardiac Enzymes: No results for input(s): CKTOTAL, CKMB, CKMBINDEX, TROPONINI in the last 168 hours. BNP (last 3 results) No results for input(s): PROBNP in the last  8760 hours. HbA1C: No results for input(s): HGBA1C in the last 72 hours. CBG: No results for input(s): GLUCAP in the last 168 hours. Lipid Profile: No results for input(s): CHOL, HDL, LDLCALC, TRIG, CHOLHDL, LDLDIRECT in the last 72 hours. Thyroid Function Tests: Recent Labs    04/29/20 2008 04/30/20 0616  TSH 7.662*  --   FREET4  --  0.94   Anemia Panel: No results for input(s): VITAMINB12, FOLATE, FERRITIN, TIBC, IRON, RETICCTPCT in the last 72 hours. Sepsis Labs: No results for input(s): PROCALCITON, LATICACIDVEN in the last 168 hours.  Recent Results (from the past 240 hour(s))  Urine Culture     Status: None   Collection Time: 04/27/20  7:32 AM   Specimen: Urine  Result Value Ref Range Status   MICRO NUMBER: [id number]  Final   SPECIMEN QUALITY: Adequate  Final   Sample Source NOT GIVEN  Final   STATUS: FINAL  Final   Result:  No Growth  Final  Resp Panel by RT-PCR (Flu A&B, Covid) Nasopharyngeal Swab     Status: Abnormal   Collection Time: 04/30/20  3:01 AM   Specimen: Nasopharyngeal Swab; Nasopharyngeal(NP) swabs in vial transport medium  Result Value Ref Range Status   SARS Coronavirus 2 by RT PCR POSITIVE (A) NEGATIVE Final    Comment: RESULT CALLED TO, READ BACK BY AND VERIFIED WITH: MONTGOMERY,M @ 0439 ON 04/30/20 BY JUW (NOTE) SARS-CoV-2 target nucleic acids are DETECTED.  The SARS-CoV-2 RNA is generally detectable in upper respiratory specimens during the acute phase of infection. Positive results are indicative of the presence of the identified virus, but do not rule out bacterial infection or co-infection with other pathogens not detected by the test. Clinical correlation with patient history and other diagnostic information is necessary to determine patient infection status. The expected result is Negative.  Fact Sheet for Patients: EntrepreneurPulse.com.au  Fact Sheet for Healthcare  Providers: IncredibleEmployment.be  This test is not yet approved or cleared by the [place] FDA and  has been authorized for detection and/or diagnosis of SARS-CoV-2 by FDA under an Emergency Use Authorization (EUA).  This EUA will remain in effect (meaning this test ca n be used) for the duration of  the COVID-19 declaration under Section 564(b)(1) of the Act, 21 U.S.C. section 360bbb-3(b)(1), unless the authorization is terminated or revoked sooner.     Influenza A by PCR NEGATIVE NEGATIVE Final   Influenza B by PCR NEGATIVE NEGATIVE Final    Comment: (NOTE) The Xpert Xpress SARS-CoV-2/FLU/RSV plus assay is intended as an aid in the diagnosis of influenza from Nasopharyngeal swab specimens and should not be used as a sole basis for treatment. Nasal washings and aspirates are unacceptable for Xpert Xpress SARS-CoV-2/FLU/RSV testing.  Fact Sheet for Patients: EntrepreneurPulse.com.au  Fact Sheet for Healthcare Providers: IncredibleEmployment.be  This test is not yet approved or cleared by the [place] FDA and has been authorized for detection and/or diagnosis of SARS-CoV-2 by FDA under an Emergency Use Authorization (EUA). This EUA will remain in effect (meaning this test can be used) for the duration of the COVID-19 declaration under Section 564(b)(1) of the Act, 21 U.S.C. section 360bbb-3(b)(1), unless the authorization is terminated or revoked.  Performed at [facility], [address] [place], [place] [address]   Resp Panel by RT-PCR (Flu A&B, Covid) Nasopharyngeal Swab     Status: None   Collection Time: 04/30/20  9:38 AM   Specimen: Nasopharyngeal Swab; Nasopharyngeal(NP) swabs in vial transport medium  Result Value Ref Range Status   SARS Coronavirus 2 by RT PCR NEGATIVE NEGATIVE Final    Comment: (NOTE) SARS-CoV-2 target nucleic acids are NOT DETECTED.  The SARS-CoV-2 RNA is generally detectable in  upper respiratory specimens during the acute phase of infection. The lowest concentration of SARS-CoV-2 viral copies this assay can detect is 138 copies/mL. A negative result does not preclude SARS-Cov-2 infection and should not be used as the sole basis for treatment or other patient management decisions. A negative result may occur with  improper specimen collection/handling, submission of specimen other than nasopharyngeal swab, presence of viral mutation(s) within the areas targeted by this assay, and inadequate number of viral copies(<138 copies/mL). A negative result must be combined with clinical observations, patient history, and epidemiological information. The expected result is Negative.  Fact Sheet for Patients:  EntrepreneurPulse.com.au  Fact Sheet for Healthcare Providers:  IncredibleEmployment.be  This test is no t yet approved or cleared by the [place]  States FDA and  has been authorized for detection and/or diagnosis of SARS-CoV-2 by FDA under an Emergency Use Authorization (EUA). This EUA will remain  in effect (meaning this test can be used) for the duration of the COVID-19 declaration under Section 564(b)(1) of the Act, 21 U.S.C.section 360bbb-3(b)(1), unless the authorization is terminated  or revoked sooner.       Influenza A by PCR NEGATIVE NEGATIVE Final   Influenza B by PCR NEGATIVE NEGATIVE Final    Comment: (NOTE) The Xpert Xpress SARS-CoV-2/FLU/RSV plus assay is intended as an aid in the diagnosis of influenza from Nasopharyngeal swab specimens and should not be used as a sole basis for treatment. Nasal washings and aspirates are unacceptable for Xpert Xpress SARS-CoV-2/FLU/RSV testing.  Fact Sheet for Patients: EntrepreneurPulse.com.au  Fact Sheet for Healthcare Providers: IncredibleEmployment.be  This test is not yet approved or cleared by the [place] FDA and has been  authorized for detection and/or diagnosis of SARS-CoV-2 by FDA under an Emergency Use Authorization (EUA). This EUA will remain in effect (meaning this test can be used) for the duration of the COVID-19 declaration under Section 564(b)(1) of the Act, 21 U.S.C. section 360bbb-3(b)(1), unless the authorization is terminated or revoked.  Performed at [facility], [address] [place], [place] [address]   MRSA PCR Screening     Status: None   Collection Time: 04/30/20 12:26 PM   Specimen: Nasal Mucosa; Nasopharyngeal  Result Value Ref Range Status   MRSA by PCR NEGATIVE NEGATIVE Final    Comment:        The GeneXpert MRSA Assay (FDA approved for NASAL specimens only), is one component of a comprehensive MRSA colonization surveillance program. It is not intended to diagnose MRSA infection nor to guide or monitor treatment for MRSA infections. Performed at [facility], [address] [place], [place] [address]          Radiology Studies: CT Head Wo Contrast  Result Date: 04/29/2020 CLINICAL DATA:  Headache. Fall at home. Multiple falls last several days. EXAM: CT HEAD WITHOUT CONTRAST TECHNIQUE: Contiguous axial images were obtained from the base of the skull through the vertex without intravenous contrast. COMPARISON:  03/27/2020 FINDINGS: Brain: No evidence of acute infarction, hemorrhage, hydrocephalus, extra-axial collection or mass lesion/mass effect. Mild scatter areas of white matter hypoattenuation are noted, stable consistent with chronic microvascular ischemic change. Vascular: No hyperdense vessel or unexpected calcification. Skull: Normal. Negative for fracture or focal lesion. Sinuses/Orbits: Visualized globes and orbits are unremarkable. Visualized sinuses are clear. Other: None. IMPRESSION: 1. No acute intracranial abnormalities. 2. No change from the recent prior study. Electronically Signed   By: Lajean Manes M.D.   On: 04/29/2020 19:59   DG Chest Portable 1  View  Result Date: 04/29/2020 CLINICAL DATA:  Headache. Multiple falls over the last several days. Increased confusion. On blood thinners. EXAM: PORTABLE CHEST 1 VIEW COMPARISON:  03/28/2020 and older studies. FINDINGS: Stable changes from cardiac surgery and aortic valve replacement. Cardiac silhouette is normal in size. No mediastinal or hilar masses. Stable scarring at the lung apices.  Lungs otherwise clear. Surgical vascular clips project over the right axilla, stable. Stable left anterior chest wall sequential pacemaker. Skeletal structures are grossly intact. IMPRESSION: No acute cardiopulmonary disease. Electronically Signed   By: Lajean Manes M.D.   On: 04/29/2020 20:00        Scheduled Meds: . Chlorhexidine Gluconate Cloth  6 each Topical Daily  . metoprolol succinate  12.5 mg Oral Daily  LOS: 1 day    Time spent: 40 minutes    Markanthony Gedney Darleen Crocker, DO [facility]  If 7PM-7AM, please contact night-coverage [email or web address] 04/30/2020, 4:31 PM

## 2020-04-30 NOTE — TOC Initial Note (Signed)
Transition of Care [facility]) - Initial/Assessment Note    Patient Details  Name: [name] MRN: [id number] Date of Birth: November 25, 1950  Transition of Care [facility]) CM/SW Contact:    Boneta Lucks, RN Phone Number: 04/30/2020, 1:37 PM  Clinical Narrative:   Patient admitted with altered mental status, recent fall. Patient has asymptomatic now testing positive for COVID. TOC spoke with daughter- Amy, she is wanting patient referred out to SNF. PT eval and Floor assessment is pending.  Amy's first choice is [facility] and second is [facility]. TOC to follow to complete FL2 and refer out.                   Expected Discharge Plan: Skilled Nursing Facility Barriers to Discharge: Continued Medical Work up   Patient Goals and CMS Choice Patient states their goals for this hospitalization and ongoing recovery are:: to go to rehab. CMS Medicare.gov Compare Post Acute Care list provided to:: Patient Represenative (must comment) Choice offered to / list presented to : Adult Children  Expected Discharge Plan and Services Expected Discharge Plan: [place] Choice: [place] Living arrangements for the past 2 months: Single Family Home                     Prior Living Arrangements/Services Living arrangements for the past 2 months: Single Family Home Lives with:: Self Patient language and need for interpreter reviewed:: Yes        Need for Family Participation in Patient Care: Yes (Comment) Care giver support system in place?: Yes (comment)   Criminal Activity/Legal Involvement Pertinent to Current Situation/Hospitalization: No - Comment as needed  Activities of Daily Living   Permission Sought/Granted     Emotional Assessment      Alcohol / Substance Use: Not Applicable Psych Involvement: No (comment)  Admission diagnosis:  Altered mental status [R41.82] Encephalopathy [G93.40] Multiple falls [R29.6] Hypothyroidism, unspecified  type [E03.9] Patient Active Problem List   Diagnosis Date Noted  . Supratherapeutic INR 04/30/2020  . Elevated liver enzymes 04/30/2020  . Elevated TSH 04/30/2020  . Altered mental status 04/29/2020  . Tachycardia-bradycardia syndrome ([place]) 04/25/2020  . Pacemaker 04/25/2020  . Recurrent falls 03/27/2020  . Bradycardia 08/23/2019  . Prolonged QT interval   . Near syncope 08/21/2019  . GAD (generalized anxiety disorder) 04/25/2018  . Ankle pain 02/04/2018  . Nocturia 11/02/2017  . Low back pain 11/02/2017  . Type 2 diabetes mellitus with diabetic polyneuropathy, without long-term current use of insulin ([place]) 11/02/2017  . Hyperlipidemia associated with type 2 diabetes mellitus ([place]) 06/05/2016  . Migraine 06/05/2016  . Encounter for therapeutic drug monitoring 09/13/2013  . Essential hypertension, benign 04/05/2013  . Atrial fibrillation ([place]) 03/18/2013  . Long term current use of anticoagulant therapy 03/11/2013  . UTI (urinary tract infection) 02/10/2013  . Pernicious anemia 02/21/2011  . Renal insufficiency 12/30/2010  . Hyperthyroidism 12/18/2010  . Anemia 05/28/2010  . Bipolar disorder ([place]) 03/11/2010  . DIVERTICULOSIS OF COLON 03/11/2010  . Constipation 03/11/2010  . Hx of mechanical aortic valve replacement 03/11/2010   PCP:  Mosie Lukes, MD Pharmacy:   [facility] ([address], [place] [address] Phone: [phone] Fax: [phone]  [id number] - [place], [place] [address] Phone: [phone] Fax: [phone]

## 2020-04-30 NOTE — ED Notes (Signed)
PT daughter (Amy) updated with new lab results and positive Covid test.

## 2020-04-30 NOTE — ED Notes (Signed)
Dr Josephine Cables paged.

## 2020-04-30 NOTE — ED Notes (Signed)
Date and time results received: 04/30/20  Test: Covid Critical Value: Pos  Name of Provider Notified: Dr.  Josephine Cables   Orders Receivedyes Or Actions Taken yes

## 2020-04-30 NOTE — Progress Notes (Signed)
ANTICOAGULATION CONSULT NOTE - Initial Up Consult   Pharmacy Consult for warfarin dosing  Indication: status post mechanical aortic valve    Allergies  Allergen Reactions  . Mucinex [Guaifenesin Er] Other (See Comments)    Severe headaches   . Keflex [Cephalexin] Other (See Comments)    Headaches and dizziness  . Sulfa Antibiotics Other (See Comments)    headaches  . Sulfonamide Derivatives Other (See Comments)    Headaches       Patient Measurements: Last Weight  Most recent update: 04/29/2020  5:50 PM   Weight  92.1 kg (203 lb)           Body mass index is 30.87 kg/m. [name]               BP: 157/88 (12/06 1500) Pulse Rate: 70 (12/06 1500)  Labs: Recent Labs    04/29/20 2007 04/30/20 0616  HGB 12.6 12.1  HCT 40.1 38.3  PLT 197 210  APTT  --  42*  LABPROT 31.9* 32.7*  INR 3.2* 3.3*  CREATININE 1.25* 1.20*    Estimated Creatinine Clearance: 52.5 mL/min (A) (by C-G formula based on SCr of 1.2 mg/dL (H)).     Medications:  Medications Prior to Admission  Medication Sig Dispense Refill Last Dose  . acetaminophen (TYLENOL) 650 MG CR tablet Take 1,300 mg by mouth every 8 (eight) hours as needed for pain.   Past Month at Unknown time  . amiodarone (PACERONE) 200 MG tablet TAKE 1 TABLET BY MOUTH TWICE A DAY (Patient taking differently: Take 200 mg by mouth 2 (two) times daily. ) 180 tablet 3 04/29/2020 at Unknown time  . aspirin EC 81 MG tablet Take 81 mg by mouth daily.   04/29/2020 at Unknown time  . butalbital-acetaminophen-caffeine (FIORICET) 50-325-40 MG tablet Take 1 tablet by mouth as needed for migraine.   unknown  . Cholecalciferol (VITAMIN D3) 1.25 MG (50000 UT) TABS Take 1 tablet by mouth daily.   04/29/2020 at Unknown time  . furosemide (LASIX) 40 MG tablet Take 1 tablet (40 mg total) by mouth daily as needed for edema. 90 tablet 3 04/29/2020 at Unknown time  . lamoTRIgine (LAMICTAL) 100 MG tablet Take 100 mg by mouth in the morning and 200 mg by  mouth at bedtime (Patient taking differently: Take 100-200 mg by mouth See admin instructions. Take 100 mg by mouth in the morning and 200 mg by mouth at bedtime) 270 tablet 1 04/29/2020 at Unknown time  . metoprolol succinate (TOPROL XL) 25 MG 24 hr tablet Take 0.5 tablets (12.5 mg total) by mouth daily. 90 tablet 1 04/29/2020 at 0830  . QUEtiapine (SEROQUEL XR) 300 MG 24 hr tablet Take 2 tablets (600 mg total) by mouth every evening. 180 tablet 1 04/28/2020 at Unknown time  . SUMAtriptan (IMITREX) 50 MG tablet TAKE ONE TABLET EVERY 2 HOURS AS NEEDED FOR MIGRAINE. MAY REPEAT IN 2 HOURS IF HEADACHE PERSISTS OR RECURS. (MAX OF 2 TABLETS IN 24 HOURS) (Patient taking differently: Take 50 mg by mouth every 2 (two) hours as needed for migraine. ) 10 tablet 0 unknown  . venlafaxine XR (EFFEXOR XR) 75 MG 24 hr capsule Take 1 capsule (75 mg total) by mouth daily with breakfast. 90 capsule 1 04/29/2020 at Unknown time  . vitamin B-12 1000 MCG tablet Take 1 tablet (1,000 mcg total) by mouth daily. 30 tablet 0 04/29/2020 at Unknown time  . warfarin (COUMADIN) 5 MG tablet Take as directed by Coumadin  Clinic. (Patient taking differently: Take 2.5-5 mg by mouth See admin instructions. Take 2.5mg  on Mondays and Fridays, and 5mg  on all other days.Take every night.) 110 tablet 1 04/28/2020 at 2030  . glucose blood (ONETOUCH VERIO) test strip Check blood sugars once daily Dx: E11.9 100 each 12   . Lancets (ONETOUCH ULTRASOFT) lancets Use as instructed to test blood sugars once daily Dx. E11.65 100 each 12   . nitrofurantoin, macrocrystal-monohydrate, (MACROBID) 100 MG capsule Take 1 capsule (100 mg total) by mouth 2 (two) times daily for 7 days. (Patient not taking: Reported on 04/29/2020) 14 capsule 0 Completed Course at Unknown time  . ONE TOUCH LANCETS MISC Test once daily to check blood sugar. DX E11.9 100 each 3    Scheduled:  . Chlorhexidine Gluconate Cloth  6 each Topical Daily  . [START ON 05/01/2020] levothyroxine   12.5 mcg Oral Q0600  . metoprolol succinate  12.5 mg Oral Daily  . warfarin  6 mg Oral ONCE-1600  . [START ON 05/01/2020] Warfarin - Pharmacist Dosing Inpatient   Does not apply q1600   Infusions:   PRN:  Anti-infectives (From admission, onward)   None      Goal of Therapy:  INR 2.5-3.5 Monitor platelets by anticoagulation protocol: Yes    Prior to Admission Warfarin Dosing:  [name] takes 2.5mg  of warfarin Monday and Friday, 5mg  po all other days      Admit INR was 3.3 Lab Results  Component Value Date   INR 3.3 (H) 04/30/2020   INR 3.2 (H) 04/29/2020   INR 2.4 04/25/2020    Assessment: [name] a 69 y.o. female requires anticoagulation with warfarin for the indication of  mechanical aortic valve. Warfarin will be initiated inpatient following pharmacy protocol per pharmacy consult. Patient most recent blood work is as follows: CBC Latest Ref Rng & Units 04/30/2020 04/29/2020 04/04/2020  WBC 4.0 - 10.5 K/uL 5.5 5.4 5.1  Hemoglobin 12.0 - 15.0 g/dL 12.1 12.6 10.7(L)  Hematocrit 36 - 46 % 38.3 40.1 32.1(L)  Platelets 150 - 400 K/uL 210 197 320.0     Plan: Warfarin 6mg  po x1 Monitor CBC MWF with am labs   Monitor INR daily Monitor for signs and symptoms of bleeding   Donna Christen Elsworth Ledin, PharmD, MBA, BCGP Clinical Pharmacist

## 2020-04-30 NOTE — ED Notes (Signed)
Report called to ICU

## 2020-05-01 ENCOUNTER — Inpatient Hospital Stay (HOSPITAL_COMMUNITY): Payer: PPO

## 2020-05-01 LAB — CBC
HCT: 36.8 % (ref 36.0–46.0)
Hemoglobin: 11.4 g/dL — ABNORMAL LOW (ref 12.0–15.0)
MCH: 30.1 pg (ref 26.0–34.0)
MCHC: 31 g/dL (ref 30.0–36.0)
MCV: 97.1 fL (ref 80.0–100.0)
Platelets: 205 10*3/uL (ref 150–400)
RBC: 3.79 MIL/uL — ABNORMAL LOW (ref 3.87–5.11)
RDW: 14.3 % (ref 11.5–15.5)
WBC: 5.3 10*3/uL (ref 4.0–10.5)
nRBC: 0 % (ref 0.0–0.2)

## 2020-05-01 LAB — RESP PANEL BY RT-PCR (FLU A&B, COVID) ARPGX2
Influenza A by PCR: NEGATIVE
Influenza B by PCR: NEGATIVE
SARS Coronavirus 2 by RT PCR: NEGATIVE

## 2020-05-01 LAB — PROTIME-INR
INR: 3.4 — ABNORMAL HIGH (ref 0.8–1.2)
Prothrombin Time: 33.1 seconds — ABNORMAL HIGH (ref 11.4–15.2)

## 2020-05-01 LAB — COMPREHENSIVE METABOLIC PANEL
ALT: 60 U/L — ABNORMAL HIGH (ref 0–44)
AST: 50 U/L — ABNORMAL HIGH (ref 15–41)
Albumin: 3.5 g/dL (ref 3.5–5.0)
Alkaline Phosphatase: 51 U/L (ref 38–126)
Anion gap: 9 (ref 5–15)
BUN: 18 mg/dL (ref 8–23)
CO2: 26 mmol/L (ref 22–32)
Calcium: 8.8 mg/dL — ABNORMAL LOW (ref 8.9–10.3)
Chloride: 103 mmol/L (ref 98–111)
Creatinine, Ser: 1.07 mg/dL — ABNORMAL HIGH (ref 0.44–1.00)
GFR, Estimated: 56 mL/min — ABNORMAL LOW (ref >60)
Glucose, Bld: 103 mg/dL — ABNORMAL HIGH (ref 70–99)
Potassium: 3.7 mmol/L (ref 3.5–5.1)
Sodium: 138 mmol/L (ref 135–145)
Total Bilirubin: 0.6 mg/dL (ref 0.3–1.2)
Total Protein: 6.3 g/dL — ABNORMAL LOW (ref 6.5–8.1)

## 2020-05-01 LAB — MAGNESIUM: Magnesium: 2.1 mg/dL (ref 1.7–2.4)

## 2020-05-01 MED ORDER — WARFARIN SODIUM 5 MG PO TABS
5.0000 mg | ORAL_TABLET | Freq: Once | ORAL | Status: AC
  Administered 2020-05-01: 5 mg via ORAL
  Filled 2020-05-01: qty 1

## 2020-05-01 MED ORDER — LEVOTHYROXINE SODIUM 25 MCG PO TABS: 12.5000 ug | ORAL_TABLET | Freq: Every day | ORAL | 0 refills | Status: DC

## 2020-05-01 MED ORDER — AMIODARONE HCL 200 MG PO TABS
200.0000 mg | ORAL_TABLET | Freq: Two times a day (BID) | ORAL | Status: DC
  Administered 2020-05-01 – 2020-05-03 (×5): 200 mg via ORAL
  Filled 2020-05-01 (×5): qty 1

## 2020-05-01 NOTE — TOC Progression Note (Signed)
Transition of Care [facility]) - Progression Note    Patient Details  Name: RAKEB KIBBLE MRN: [id number] Date of Birth: 12/18/1950  [facility]) CM/SW Contact  Boneta Lucks, RN Phone Number: 05/01/2020, 3:07 PM  Clinical Narrative:   [facility] made a bed offer, daughter accepted. [employer] Auth started for SNF and EMS with [facility]. MD and RN updated, Patient should be medically ready to go to SNF tomorrow. MD will work DC summary. Irine Seal at [facility] has assigned a bed. Patient is COVID neg and had COVID vaccines.    Expected Discharge Plan: [place] Barriers to Discharge: Continued Medical Work up  Expected Discharge Plan and Services Expected Discharge Plan: [place] Choice: [place] arrangements for the past 2 months: [place]

## 2020-05-01 NOTE — Discharge Summary (Addendum)
Physician Discharge Summary  [name] HCW:[id number] DOB: [birth year] DOA: 04/29/2020  PCP: Mosie Lukes, MD  Admit date: 04/29/2020  Discharge date: 05/02/2020  Admitted From:Home  Disposition:  Home with [facility] (insurance declined SNF)  Recommendations for Outpatient Follow-up:  1. Follow up with PCP tomorrow as scheduled 2. Follow up with Dr. Dorris Fetch in 2 weeks 3. Continue on medications as prior 4. Continue levothyroxine as prescribed and follow-up TSH and free T4 levels in outpatient setting 5. Please monitor PT/INR closely per protocol  Home Health: PT, OT, RN, Aide, SW  Equipment/Devices: None  Discharge Condition: Stable  CODE STATUS: Full  Diet recommendation: Heart Healthy  Brief/Interim Summary: Per HPI: [name] a 69 y.o.femalewith medical history significant foraortic insufficiency status post mechanical valve replacement on Coumadin, atrial fibrillation/flutter status post PPM, chronic combined systolic and diastolic CHF, noninsulin-dependent type II diabetes, migraine headaches, anemia, recurrent UTIs, hypertension, hyperlipidemia, gout, depression, bipolar disordewho presents to the emergency department due to recurrent multiple falls. Patient states that she has fallen 6 times over the last week, she endorses hitting her head without injury. Patient stated that she ambulates with a walker at baseline and that when she falls, she falls backwardswiththewalker. She lives with daughter and her family and states that herdaughter states that she has been more confused recently. Recently history was obtained from ED physician and ED medical record. Per report, patient was recently seen by her PCP started on Macrobid for UTI.  ED Course: In the emergency department, she was hemodynamically stable. Work-up in the ED showed normal CBC and BMP, TSH was elevated at 7.662, AST 17, ALT 71, ALT 16, urinalysis was unimpressive for UTI, INR 3.2. Chest x-ray  showed no acute cardiopulmonary disease. CT of head without contrast showed no acute intracranial abnormalities Hospitalist was asked to admit patient for further evaluation and management.  -Patient has been admitted for evaluation of confusion as well as weakness with repeated falls at home.  It is thought that perhaps she could have had some symptoms related to recrudescence of CVA. Initial Covid test noted to be positive, but then repeat was negative.  CT head negative.  Noted to have some elevated TSH and symptoms of hypothyroidism for which she has been started on low-dose levothyroxine.  She has not had any further confusion during the course of her stay and appears to be at her usual baseline level of mentation.  She has been seen by PT with profound weakness noted and recommendations for SNF/rehab.  She has had no other acute events noted throughout the course of this hospitalization and is overall stable for discharge.  Pt's insurance did not approve for SNF.  Pt agreeable to DC to home with [facility] services PT, OT, Aide, SW, RN.    Discharge Diagnoses:  Active Problems:   Hx of mechanical aortic valve replacement   Atrial fibrillation (HCC)   Prolonged QT interval   Recurrent falls   Altered mental status   Supratherapeutic INR   Elevated liver enzymes   Elevated TSH  Principal discharge diagnosis: Reported altered mentation with weakness and recurrent falls-multifactorial in the setting of recent UTI, possible dehydration, hypothyroidism, versus recrudescence CVA.  Discharge Instructions   Allergies as of 05/02/2020      Reactions   Mucinex [guaifenesin Er] Other (See Comments)   Severe headaches   Keflex [cephalexin] Other (See Comments)   Headaches and dizziness   Sulfa Antibiotics Other (See Comments)   headaches   Sulfonamide Derivatives  Other (See Comments)   Headaches      Medication List    STOP taking these medications   nitrofurantoin (macrocrystal-monohydrate)  100 MG capsule Commonly known as: Macrobid     TAKE these medications   acetaminophen 650 MG CR tablet Commonly known as: TYLENOL Take 1,300 mg by mouth every 8 (eight) hours as needed for pain.   amiodarone 200 MG tablet Commonly known as: PACERONE TAKE 1 TABLET BY MOUTH TWICE A DAY   aspirin EC 81 MG tablet Take 81 mg by mouth daily.   butalbital-acetaminophen-caffeine 50-325-40 MG tablet Commonly known as: FIORICET Take 1 tablet by mouth as needed for migraine.   cyanocobalamin 1000 MCG tablet Take 1 tablet (1,000 mcg total) by mouth daily.   furosemide 40 MG tablet Commonly known as: LASIX Take 1 tablet (40 mg total) by mouth daily as needed for edema.   glucose blood test strip Commonly known as: OneTouch Verio Check blood sugars once daily Dx: E11.9   lamoTRIgine 100 MG tablet Commonly known as: LAMICTAL Take 100 mg by mouth in the morning and 200 mg by mouth at bedtime What changed:   how much to take  how to take this  when to take this   levothyroxine 25 MCG tablet Commonly known as: SYNTHROID Take 0.5 tablets (12.5 mcg total) by mouth daily at 6 (six) AM.   metoprolol succinate 25 MG 24 hr tablet Commonly known as: Toprol XL Take 0.5 tablets (12.5 mg total) by mouth daily.   ONE TOUCH LANCETS Misc Test once daily to check blood sugar. DX E11.9   onetouch ultrasoft lancets Use as instructed to test blood sugars once daily Dx. E11.65   QUEtiapine 300 MG 24 hr tablet Commonly known as: SEROQUEL XR Take 2 tablets (600 mg total) by mouth every evening.   SUMAtriptan 50 MG tablet Commonly known as: IMITREX TAKE ONE TABLET EVERY 2 HOURS AS NEEDED FOR MIGRAINE. MAY REPEAT IN 2 HOURS IF HEADACHE PERSISTS OR RECURS. (MAX OF 2 TABLETS IN 24 HOURS) What changed: See the new instructions.   venlafaxine XR 75 MG 24 hr capsule Commonly known as: Effexor XR Take 1 capsule (75 mg total) by mouth daily with breakfast.   Vitamin D3 1.25 MG (50000 UT)  Tabs Take 1 tablet by mouth daily.   warfarin 5 MG tablet Commonly known as: COUMADIN Take as directed. If you are unsure how to take this medication, talk to your nurse or doctor. Original instructions: Take as directed by [facility]. What changed:   how much to take  how to take this  when to take this  additional instructions       Contact information for follow-up providers    Mosie Lukes, MD Follow up in 1 week(s).   Specialty: Family Medicine Contact information: [place] [address] [phone]            Contact information for after-discharge care    Destination    [facility] .   Service: Skilled Nursing Contact information: [place] [address] [phone]                 Allergies  Allergen Reactions  . Mucinex [Guaifenesin Er] Other (See Comments)    Severe headaches   . Keflex [Cephalexin] Other (See Comments)    Headaches and dizziness  . Sulfa Antibiotics Other (See Comments)    headaches  . Sulfonamide Derivatives Other (  See Comments)    Headaches     Consultations:  None   Procedures/Studies: CT Head Wo Contrast  Result Date: 04/29/2020 CLINICAL DATA:  Headache. Fall at home. Multiple falls last several days. EXAM: CT HEAD WITHOUT CONTRAST TECHNIQUE: Contiguous axial images were obtained from the base of the skull through the vertex without intravenous contrast. COMPARISON:  03/27/2020 FINDINGS: Brain: No evidence of acute infarction, hemorrhage, hydrocephalus, extra-axial collection or mass lesion/mass effect. Mild scatter areas of white matter hypoattenuation are noted, stable consistent with chronic microvascular ischemic change. Vascular: No hyperdense vessel or unexpected calcification. Skull: Normal. Negative for fracture or focal lesion. Sinuses/Orbits: Visualized globes and orbits are unremarkable. Visualized sinuses are clear. Other:  None. IMPRESSION: 1. No acute intracranial abnormalities. 2. No change from the recent prior study. Electronically Signed   By: Lajean Manes M.D.   On: 04/29/2020 19:59   DG Chest Portable 1 View  Result Date: 04/29/2020 CLINICAL DATA:  Headache. Multiple falls over the last several days. Increased confusion. On blood thinners. EXAM: PORTABLE CHEST 1 VIEW COMPARISON:  03/28/2020 and older studies. FINDINGS: Stable changes from cardiac surgery and aortic valve replacement. Cardiac silhouette is normal in size. No mediastinal or hilar masses. Stable scarring at the lung apices.  Lungs otherwise clear. Surgical vascular clips project over the right axilla, stable. Stable left anterior chest wall sequential pacemaker. Skeletal structures are grossly intact. IMPRESSION: No acute cardiopulmonary disease. Electronically Signed   By: Lajean Manes M.D.   On: 04/29/2020 20:00   CUP PACEART INCLINIC DEVICE CHECK  Result Date: 04/25/2020 Pacemaker check in clinic with industry. Device programmed VVIR due to AF.  Pt continues in AF today.  Pt is on [place], DCCV planned.  Thresholds, sensing, impedances consistent with previous measurements. Device programmed to maximize longevity- Ventricular Auto Capture programmed on today. No high ventricular rates noted. Device programmed at appropriate safety margins. Histogram distribution appropriate for patient activity level. Device programmed to optimize intrinsic conduction. Estimated longevity 14 years. Patient enrolled in remote follow-up, next scheduled check 07/11/20.  ROV with Dr. Lovena Le in 4 months.   Patient education completed.Trena Platt, BSN, RN  CUP PACEART REMOTE DEVICE CHECK  Result Date: 04/11/2020 Scheduled remote reviewed. Normal device function.  (programmed VVIR) Next remote 91 days. Kathy Breach, RN, CCDS, [employer]   Discharge Exam: Vitals:   05/02/20 0004 05/02/20 0642  BP: (!) 135/98 (!) 120/56  Pulse:  63  Resp: 18   Temp:  97.6 F  (36.4 C)  SpO2:     Vitals:   05/01/20 1700 05/01/20 1941 05/02/20 0004 05/02/20 0642  BP:  121/63 (!) 135/98 (!) 120/56  Pulse:  64  63  Resp:  13 18   Temp: 98.1 F (36.7 C) 98.1 F (36.7 C)  97.6 F (36.4 C)  TempSrc: Oral Oral  Oral  SpO2:  96%    Weight:      Height:       General: Pt is alert, awake, not in acute distress Cardiovascular: RRR, S1/S2 +, no rubs, no gallops Respiratory: CTA bilaterally, no wheezing, no rhonchi Abdominal: Soft, NT, ND, bowel sounds + Extremities: no edema, no cyanosis Neurological: moving all extremities.   The results of significant diagnostics from this hospitalization (including imaging, microbiology, ancillary and laboratory) are listed below for reference.     Microbiology: Recent Results (from the past 240 hour(s))  Urine Culture     Status: None   Collection Time: 04/27/20  7:32 AM  Specimen: Urine  Result Value Ref Range Status   MICRO NUMBER: [id number]  Final   SPECIMEN QUALITY: Adequate  Final   Sample Source NOT GIVEN  Final   STATUS: FINAL  Final   Result: No Growth  Final  Resp Panel by RT-PCR (Flu A&B, Covid) Nasopharyngeal Swab     Status: Abnormal   Collection Time: 04/30/20  3:01 AM   Specimen: Nasopharyngeal Swab; Nasopharyngeal(NP) swabs in vial transport medium  Result Value Ref Range Status   SARS Coronavirus 2 by RT PCR POSITIVE (A) NEGATIVE Final    Comment: RESULT CALLED TO, READ BACK BY AND VERIFIED WITH: MONTGOMERY,M @ 0439 ON 04/30/20 BY JUW (NOTE) SARS-CoV-2 target nucleic acids are DETECTED.  The SARS-CoV-2 RNA is generally detectable in upper respiratory specimens during the acute phase of infection. Positive results are indicative of the presence of the identified virus, but do not rule out bacterial infection or co-infection with other pathogens not detected by the test. Clinical correlation with patient history and other diagnostic information is necessary to determine patient infection status.  The expected result is Negative.  Fact Sheet for Patients: EntrepreneurPulse.com.au  Fact Sheet for Healthcare Providers: IncredibleEmployment.be  This test is not yet approved or cleared by the [place] FDA and  has been authorized for detection and/or diagnosis of SARS-CoV-2 by FDA under an Emergency Use Authorization (EUA).  This EUA will remain in effect (meaning this test ca n be used) for the duration of  the COVID-19 declaration under Section 564(b)(1) of the Act, 21 U.S.C. section 360bbb-3(b)(1), unless the authorization is terminated or revoked sooner.     Influenza A by PCR NEGATIVE NEGATIVE Final   Influenza B by PCR NEGATIVE NEGATIVE Final    Comment: (NOTE) The Xpert Xpress SARS-CoV-2/FLU/RSV plus assay is intended as an aid in the diagnosis of influenza from Nasopharyngeal swab specimens and should not be used as a sole basis for treatment. Nasal washings and aspirates are unacceptable for Xpert Xpress SARS-CoV-2/FLU/RSV testing.  Fact Sheet for Patients: EntrepreneurPulse.com.au  Fact Sheet for Healthcare Providers: IncredibleEmployment.be  This test is not yet approved or cleared by the [place] FDA and has been authorized for detection and/or diagnosis of SARS-CoV-2 by FDA under an Emergency Use Authorization (EUA). This EUA will remain in effect (meaning this test can be used) for the duration of the COVID-19 declaration under Section 564(b)(1) of the Act, 21 U.S.C. section 360bbb-3(b)(1), unless the authorization is terminated or revoked.  Performed at [facility], [address], [place], [place] [address]   Resp Panel by RT-PCR (Flu A&B, Covid) Nasopharyngeal Swab     Status: None   Collection Time: 04/30/20  9:38 AM   Specimen: Nasopharyngeal Swab; Nasopharyngeal(NP) swabs in vial transport medium  Result Value Ref Range Status   SARS Coronavirus 2 by RT PCR NEGATIVE  NEGATIVE Final    Comment: (NOTE) SARS-CoV-2 target nucleic acids are NOT DETECTED.  The SARS-CoV-2 RNA is generally detectable in upper respiratory specimens during the acute phase of infection. The lowest concentration of SARS-CoV-2 viral copies this assay can detect is 138 copies/mL. A negative result does not preclude SARS-Cov-2 infection and should not be used as the sole basis for treatment or other patient management decisions. A negative result may occur with  improper specimen collection/handling, submission of specimen other than nasopharyngeal swab, presence of viral mutation(s) within the areas targeted by this assay, and inadequate number of viral copies(<138 copies/mL). A negative result must be combined with clinical  observations, patient history, and epidemiological information. The expected result is Negative.  Fact Sheet for Patients:  EntrepreneurPulse.com.au  Fact Sheet for Healthcare Providers:  IncredibleEmployment.be  This test is no t yet approved or cleared by the [place] FDA and  has been authorized for detection and/or diagnosis of SARS-CoV-2 by FDA under an Emergency Use Authorization (EUA). This EUA will remain  in effect (meaning this test can be used) for the duration of the COVID-19 declaration under Section 564(b)(1) of the Act, 21 U.S.C.section 360bbb-3(b)(1), unless the authorization is terminated  or revoked sooner.       Influenza A by PCR NEGATIVE NEGATIVE Final   Influenza B by PCR NEGATIVE NEGATIVE Final    Comment: (NOTE) The Xpert Xpress SARS-CoV-2/FLU/RSV plus assay is intended as an aid in the diagnosis of influenza from Nasopharyngeal swab specimens and should not be used as a sole basis for treatment. Nasal washings and aspirates are unacceptable for Xpert Xpress SARS-CoV-2/FLU/RSV testing.  Fact Sheet for Patients: EntrepreneurPulse.com.au  Fact Sheet for Healthcare  Providers: IncredibleEmployment.be  This test is not yet approved or cleared by the [place] FDA and has been authorized for detection and/or diagnosis of SARS-CoV-2 by FDA under an Emergency Use Authorization (EUA). This EUA will remain in effect (meaning this test can be used) for the duration of the COVID-19 declaration under Section 564(b)(1) of the Act, 21 U.S.C. section 360bbb-3(b)(1), unless the authorization is terminated or revoked.  Performed at [facility], [address], [place], [place] [address]   MRSA PCR Screening     Status: None   Collection Time: 04/30/20 12:26 PM   Specimen: Nasal Mucosa; Nasopharyngeal  Result Value Ref Range Status   MRSA by PCR NEGATIVE NEGATIVE Final    Comment:        The GeneXpert MRSA Assay (FDA approved for NASAL specimens only), is one component of a comprehensive MRSA colonization surveillance program. It is not intended to diagnose MRSA infection nor to guide or monitor treatment for MRSA infections. Performed at [facility], [address], [place], [place] [address]   Resp Panel by RT-PCR (Flu A&B, Covid) Nasopharyngeal Swab     Status: None   Collection Time: 05/01/20 10:29 AM   Specimen: Nasopharyngeal Swab; Nasopharyngeal(NP) swabs in vial transport medium  Result Value Ref Range Status   SARS Coronavirus 2 by RT PCR NEGATIVE NEGATIVE Final    Comment: (NOTE) SARS-CoV-2 target nucleic acids are NOT DETECTED.  The SARS-CoV-2 RNA is generally detectable in upper respiratory specimens during the acute phase of infection. The lowest concentration of SARS-CoV-2 viral copies this assay can detect is 138 copies/mL. A negative result does not preclude SARS-Cov-2 infection and should not be used as the sole basis for treatment or other patient management decisions. A negative result may occur with  improper specimen collection/handling, submission of specimen other than nasopharyngeal swab, presence of  viral mutation(s) within the areas targeted by this assay, and inadequate number of viral copies(<138 copies/mL). A negative result must be combined with clinical observations, patient history, and epidemiological information. The expected result is Negative.  Fact Sheet for Patients:  EntrepreneurPulse.com.au  Fact Sheet for Healthcare Providers:  IncredibleEmployment.be  This test is no t yet approved or cleared by the [place] FDA and  has been authorized for detection and/or diagnosis of SARS-CoV-2 by FDA under an Emergency Use Authorization (EUA). This EUA will remain  in effect (meaning this test can be used) for the duration of the COVID-19  declaration under Section 564(b)(1) of the Act, 21 U.S.C.section 360bbb-3(b)(1), unless the authorization is terminated  or revoked sooner.       Influenza A by PCR NEGATIVE NEGATIVE Final   Influenza B by PCR NEGATIVE NEGATIVE Final    Comment: (NOTE) The Xpert Xpress SARS-CoV-2/FLU/RSV plus assay is intended as an aid in the diagnosis of influenza from Nasopharyngeal swab specimens and should not be used as a sole basis for treatment. Nasal washings and aspirates are unacceptable for Xpert Xpress SARS-CoV-2/FLU/RSV testing.  Fact Sheet for Patients: EntrepreneurPulse.com.au  Fact Sheet for Healthcare Providers: IncredibleEmployment.be  This test is not yet approved or cleared by the [place] FDA and has been authorized for detection and/or diagnosis of SARS-CoV-2 by FDA under an Emergency Use Authorization (EUA). This EUA will remain in effect (meaning this test can be used) for the duration of the COVID-19 declaration under Section 564(b)(1) of the Act, 21 U.S.C. section 360bbb-3(b)(1), unless the authorization is terminated or revoked.  Performed at [facility], [address] [place], [place] [address]    Labs: BNP (last 3 results) No  results for input(s): BNP in the last 8760 hours. Basic Metabolic Panel: Recent Labs  Lab 04/29/20 2007 04/30/20 0616 05/01/20 0510  NA 138 140 138  K 4.7 3.7 3.7  CL 100 101 103  CO2 29 28 26   GLUCOSE 98 97 103*  BUN 13 13 18   CREATININE 1.25* 1.20* 1.07*  CALCIUM 9.2 9.3 8.8*  MG  --  2.2 2.1  PHOS  --  3.8  --    Liver Function Tests: Recent Labs  Lab 04/29/20 2007 04/30/20 0616 05/01/20 0510  AST 79* 73* 50*  ALT 78* 74* 60*  ALKPHOS 61 58 51  BILITOT 0.8 0.8 0.6  PROT 7.8 7.2 6.3*  ALBUMIN 4.4 4.0 3.5   No results for input(s): LIPASE, AMYLASE in the last 168 hours. Recent Labs  Lab 04/29/20 2007  AMMONIA 29   CBC: Recent Labs  Lab 04/29/20 2007 04/30/20 0616 05/01/20 0510 05/02/20 0308  WBC 5.4 5.5 5.3 5.4  NEUTROABS 2.9  --   --   --   HGB 12.6 12.1 11.4* 11.0*  HCT 40.1 38.3 36.8 35.7*  MCV 96.2 95.5 97.1 96.7  PLT 197 210 205 195   Cardiac Enzymes: No results for input(s): CKTOTAL, CKMB, CKMBINDEX, TROPONINI in the last 168 hours. BNP: Invalid input(s): POCBNP CBG: No results for input(s): GLUCAP in the last 168 hours. D-Dimer No results for input(s): DDIMER in the last 72 hours. Hgb A1c No results for input(s): HGBA1C in the last 72 hours. Lipid Profile No results for input(s): CHOL, HDL, LDLCALC, TRIG, CHOLHDL, LDLDIRECT in the last 72 hours. Thyroid function studies Recent Labs    04/29/20 2008  TSH 7.662*   Anemia work up No results for input(s): VITAMINB12, FOLATE, FERRITIN, TIBC, IRON, RETICCTPCT in the last 72 hours. Urinalysis    Component Value Date/Time   COLORURINE STRAW (A) 04/29/2020 1907   APPEARANCEUR CLEAR 04/29/2020 1907   LABSPEC 1.003 (L) 04/29/2020 1907   PHURINE 6.0 04/29/2020 1907   GLUCOSEU NEGATIVE 04/29/2020 1907   GLUCOSEU NEGATIVE 11/02/2017 [place] 04/29/2020 1907   BILIRUBINUR NEGATIVE 04/29/2020 1907   BILIRUBINUR neg 04/26/2020 [place] 04/29/2020 1907   PROTEINUR  NEGATIVE 04/29/2020 1907   UROBILINOGEN 0.2 04/26/2020 1619   UROBILINOGEN 0.2 11/02/2017 1428   NITRITE NEGATIVE 04/29/2020 [place] NEGATIVE 04/29/2020 1907  Sepsis Labs Invalid input(s): PROCALCITONIN,  WBC,  LACTICIDVEN Microbiology Recent Results (from the past 240 hour(s))  Urine Culture     Status: None   Collection Time: 04/27/20  7:32 AM   Specimen: Urine  Result Value Ref Range Status   MICRO NUMBER: [id number]  Final   SPECIMEN QUALITY: Adequate  Final   Sample Source NOT GIVEN  Final   STATUS: FINAL  Final   Result: No Growth  Final  Resp Panel by RT-PCR (Flu A&B, Covid) Nasopharyngeal Swab     Status: Abnormal   Collection Time: 04/30/20  3:01 AM   Specimen: Nasopharyngeal Swab; Nasopharyngeal(NP) swabs in vial transport medium  Result Value Ref Range Status   SARS Coronavirus 2 by RT PCR POSITIVE (A) NEGATIVE Final    Comment: RESULT CALLED TO, READ BACK BY AND VERIFIED WITH: MONTGOMERY,M @ 0439 ON 04/30/20 BY JUW (NOTE) SARS-CoV-2 target nucleic acids are DETECTED.  The SARS-CoV-2 RNA is generally detectable in upper respiratory specimens during the acute phase of infection. Positive results are indicative of the presence of the identified virus, but do not rule out bacterial infection or co-infection with other pathogens not detected by the test. Clinical correlation with patient history and other diagnostic information is necessary to determine patient infection status. The expected result is Negative.  Fact Sheet for Patients: EntrepreneurPulse.com.au  Fact Sheet for Healthcare Providers: IncredibleEmployment.be  This test is not yet approved or cleared by the [place] FDA and  has been authorized for detection and/or diagnosis of SARS-CoV-2 by FDA under an Emergency Use Authorization (EUA).  This EUA will remain in effect (meaning this test ca n be used) for the duration of  the COVID-19 declaration  under Section 564(b)(1) of the Act, 21 U.S.C. section 360bbb-3(b)(1), unless the authorization is terminated or revoked sooner.     Influenza A by PCR NEGATIVE NEGATIVE Final   Influenza B by PCR NEGATIVE NEGATIVE Final    Comment: (NOTE) The Xpert Xpress SARS-CoV-2/FLU/RSV plus assay is intended as an aid in the diagnosis of influenza from Nasopharyngeal swab specimens and should not be used as a sole basis for treatment. Nasal washings and aspirates are unacceptable for Xpert Xpress SARS-CoV-2/FLU/RSV testing.  Fact Sheet for Patients: EntrepreneurPulse.com.au  Fact Sheet for Healthcare Providers: IncredibleEmployment.be  This test is not yet approved or cleared by the [place] FDA and has been authorized for detection and/or diagnosis of SARS-CoV-2 by FDA under an Emergency Use Authorization (EUA). This EUA will remain in effect (meaning this test can be used) for the duration of the COVID-19 declaration under Section 564(b)(1) of the Act, 21 U.S.C. section 360bbb-3(b)(1), unless the authorization is terminated or revoked.  Performed at [facility], [address], [place], [place] [address]   Resp Panel by RT-PCR (Flu A&B, Covid) Nasopharyngeal Swab     Status: None   Collection Time: 04/30/20  9:38 AM   Specimen: Nasopharyngeal Swab; Nasopharyngeal(NP) swabs in vial transport medium  Result Value Ref Range Status   SARS Coronavirus 2 by RT PCR NEGATIVE NEGATIVE Final    Comment: (NOTE) SARS-CoV-2 target nucleic acids are NOT DETECTED.  The SARS-CoV-2 RNA is generally detectable in upper respiratory specimens during the acute phase of infection. The lowest concentration of SARS-CoV-2 viral copies this assay can detect is 138 copies/mL. A negative result does not preclude SARS-Cov-2 infection and should not be used as the sole basis for treatment or other patient management decisions. A negative result may occur with  improper  specimen collection/handling, submission of specimen other than nasopharyngeal swab, presence of viral mutation(s) within the areas targeted by this assay, and inadequate number of viral copies(<138 copies/mL). A negative result must be combined with clinical observations, patient history, and epidemiological information. The expected result is Negative.  Fact Sheet for Patients:  EntrepreneurPulse.com.au  Fact Sheet for Healthcare Providers:  IncredibleEmployment.be  This test is no t yet approved or cleared by the [place] FDA and  has been authorized for detection and/or diagnosis of SARS-CoV-2 by FDA under an Emergency Use Authorization (EUA). This EUA will remain  in effect (meaning this test can be used) for the duration of the COVID-19 declaration under Section 564(b)(1) of the Act, 21 U.S.C.section 360bbb-3(b)(1), unless the authorization is terminated  or revoked sooner.       Influenza A by PCR NEGATIVE NEGATIVE Final   Influenza B by PCR NEGATIVE NEGATIVE Final    Comment: (NOTE) The Xpert Xpress SARS-CoV-2/FLU/RSV plus assay is intended as an aid in the diagnosis of influenza from Nasopharyngeal swab specimens and should not be used as a sole basis for treatment. Nasal washings and aspirates are unacceptable for Xpert Xpress SARS-CoV-2/FLU/RSV testing.  Fact Sheet for Patients: EntrepreneurPulse.com.au  Fact Sheet for Healthcare Providers: IncredibleEmployment.be  This test is not yet approved or cleared by the [place] FDA and has been authorized for detection and/or diagnosis of SARS-CoV-2 by FDA under an Emergency Use Authorization (EUA). This EUA will remain in effect (meaning this test can be used) for the duration of the COVID-19 declaration under Section 564(b)(1) of the Act, 21 U.S.C. section 360bbb-3(b)(1), unless the authorization is terminated or revoked.  Performed at  [facility], [address] [place], [place] [address]   MRSA PCR Screening     Status: None   Collection Time: 04/30/20 12:26 PM   Specimen: Nasal Mucosa; Nasopharyngeal  Result Value Ref Range Status   MRSA by PCR NEGATIVE NEGATIVE Final    Comment:        The GeneXpert MRSA Assay (FDA approved for NASAL specimens only), is one component of a comprehensive MRSA colonization surveillance program. It is not intended to diagnose MRSA infection nor to guide or monitor treatment for MRSA infections. Performed at [facility], [address] [place],  [address]   Resp Panel by RT-PCR (Flu A&B, Covid) Nasopharyngeal Swab     Status: None   Collection Time: 05/01/20 10:29 AM   Specimen: Nasopharyngeal Swab; Nasopharyngeal(NP) swabs in vial transport medium  Result Value Ref Range Status   SARS Coronavirus 2 by RT PCR NEGATIVE NEGATIVE Final    Comment: (NOTE) SARS-CoV-2 target nucleic acids are NOT DETECTED.  The SARS-CoV-2 RNA is generally detectable in upper respiratory specimens during the acute phase of infection. The lowest concentration of SARS-CoV-2 viral copies this assay can detect is 138 copies/mL. A negative result does not preclude SARS-Cov-2 infection and should not be used as the sole basis for treatment or other patient management decisions. A negative result may occur with  improper specimen collection/handling, submission of specimen other than nasopharyngeal swab, presence of viral mutation(s) within the areas targeted by this assay, and inadequate number of viral copies(<138 copies/mL). A negative result must be combined with clinical observations, patient history, and epidemiological information. The expected result is Negative.  Fact Sheet for Patients:  EntrepreneurPulse.com.au  Fact Sheet for Healthcare Providers:  IncredibleEmployment.be  This test is no t yet approved or cleared by the [place] FDA and   has  been authorized for detection and/or diagnosis of SARS-CoV-2 by FDA under an Emergency Use Authorization (EUA). This EUA will remain  in effect (meaning this test can be used) for the duration of the COVID-19 declaration under Section 564(b)(1) of the Act, 21 U.S.C.section 360bbb-3(b)(1), unless the authorization is terminated  or revoked sooner.       Influenza A by PCR NEGATIVE NEGATIVE Final   Influenza B by PCR NEGATIVE NEGATIVE Final    Comment: (NOTE) The Xpert Xpress SARS-CoV-2/FLU/RSV plus assay is intended as an aid in the diagnosis of influenza from Nasopharyngeal swab specimens and should not be used as a sole basis for treatment. Nasal washings and aspirates are unacceptable for Xpert Xpress SARS-CoV-2/FLU/RSV testing.  Fact Sheet for Patients: EntrepreneurPulse.com.au  Fact Sheet for Healthcare Providers: IncredibleEmployment.be  This test is not yet approved or cleared by the [place] FDA and has been authorized for detection and/or diagnosis of SARS-CoV-2 by FDA under an Emergency Use Authorization (EUA). This EUA will remain in effect (meaning this test can be used) for the duration of the COVID-19 declaration under Section 564(b)(1) of the Act, 21 U.S.C. section 360bbb-3(b)(1), unless the authorization is terminated or revoked.  Performed at [facility], [address], [place], [place] [address]      Time coordinating discharge: 35 minutes  SIGNED:   Irwin Brakeman, DO [facility] 05/02/2020, 7:46 AM  If 7PM-7AM, please contact night-coverage [email or web address]

## 2020-05-01 NOTE — Plan of Care (Signed)
  Problem: Acute Rehab PT Goals(only PT should resolve) Goal: Pt Will Go Supine/Side To Sit Outcome: Progressing Flowsheets (Taken 05/01/2020 1305) Pt will go Supine/Side to Sit: with min guard assist Goal: Pt Will Go Sit To Supine/Side Outcome: Progressing Flowsheets (Taken 05/01/2020 1305) Pt will go Sit to Supine/Side: with min guard assist Goal: Patient Will Transfer Sit To/From Stand Outcome: Progressing Flowsheets (Taken 05/01/2020 1305) Patient will transfer sit to/from stand: with minimal assist Goal: Pt Will Transfer Bed To Chair/Chair To Bed Outcome: Progressing Flowsheets (Taken 05/01/2020 1305) Pt will Transfer Bed to Chair/Chair to Bed: with min assist Goal: Pt Will Ambulate Outcome: Progressing Flowsheets (Taken 05/01/2020 1305) Pt will Ambulate:  25 feet  with moderate assist  with rolling walker   Tori Anayi Bricco PT, DPT 05/01/20, 1:06 PM [phone]

## 2020-05-01 NOTE — NC FL2 (Signed)
[place] LEVEL OF CARE SCREENING TOOL     IDENTIFICATION  Patient Name: [name] Birthdate: Jul 15, 1950 Sex: female Admission Date (Current Location): 04/29/2020  [facility] and [place] Number:  [employer] and Address:  [facility] [address] [place]      Provider Number: [id number]  Attending Physician Name and Address:  Rodena Goldmann, DO  Relative Name and Phone Number:  Amy Dennie Bible -  Daughter  [phone]    Current Level of Care: Hospital Recommended Level of Care: [facility] Prior Approval Number:    Date Approved/Denied:   PASRR Number: [id number]  Discharge Plan: SNF    Current Diagnoses: Patient Active Problem List   Diagnosis Date Noted  . Supratherapeutic INR 04/30/2020  . Elevated liver enzymes 04/30/2020  . Elevated TSH 04/30/2020  . Altered mental status 04/29/2020  . Tachycardia-bradycardia syndrome ([place]) 04/25/2020  . Pacemaker 04/25/2020  . Recurrent falls 03/27/2020  . Bradycardia 08/23/2019  . Prolonged QT interval   . Near syncope 08/21/2019  . GAD (generalized anxiety disorder) 04/25/2018  . Ankle pain 02/04/2018  . Nocturia 11/02/2017  . Low back pain 11/02/2017  . Type 2 diabetes mellitus with diabetic polyneuropathy, without long-term current use of insulin ([place]) 11/02/2017  . Hyperlipidemia associated with type 2 diabetes mellitus ([place]) 06/05/2016  . Migraine 06/05/2016  . Encounter for therapeutic drug monitoring 09/13/2013  . Essential hypertension, benign 04/05/2013  . Atrial fibrillation ([place]) 03/18/2013  . Long term current use of anticoagulant therapy 03/11/2013  . UTI (urinary tract infection) 02/10/2013  . Pernicious anemia 02/21/2011  . Renal insufficiency 12/30/2010  . Hyperthyroidism 12/18/2010  . Anemia 05/28/2010  . Bipolar disorder ([place]) 03/11/2010  . DIVERTICULOSIS OF COLON 03/11/2010  . Constipation 03/11/2010  . Hx of mechanical aortic  valve replacement 03/11/2010    Orientation RESPIRATION BLADDER Height & Weight     Self, Time, Situation, Place  Normal Continent Weight: 92.1 kg Height:  5\' 8"  (172.7 cm)  BEHAVIORAL SYMPTOMS/MOOD NEUROLOGICAL BOWEL NUTRITION STATUS      Continent Diet (See DC summary)  AMBULATORY STATUS COMMUNICATION OF NEEDS Skin   Extensive Assist Verbally Bruising (generalized)                       Personal Care Assistance Level of Assistance  Bathing, Dressing, Feeding, Total care Bathing Assistance: Maximum assistance Feeding assistance: Limited assistance Dressing Assistance: Maximum assistance     Functional Limitations Info  Sight, Hearing, Speech Sight Info: Adequate Hearing Info: Adequate Speech Info: Adequate    SPECIAL CARE FACTORS FREQUENCY  PT (By licensed PT)     PT Frequency: 5 time a week              Contractures Contractures Info: Not present    Additional Factors Info  Code Status, Allergies Code Status Info: FULL Allergies Info: Mucinex,keflex,sulfa, sulfonamide derivatives           Current Medications (05/01/2020):  This is the current hospital active medication list Current Facility-Administered Medications  Medication Dose Route Frequency Provider Last Rate Last Admin  . Chlorhexidine Gluconate Cloth 2 % PADS 6 each  6 each Topical Daily Manuella Ghazi, Pratik D, DO   6 each at 05/01/20 1017  . hydrALAZINE (APRESOLINE) injection 10 mg  10 mg Intravenous Q4H PRN Manuella Ghazi, Pratik D, DO      . levothyroxine (SYNTHROID) tablet 12.5 mcg  12.5 mcg Oral Q0600 Manuella Ghazi, Pratik  D, DO   12.5 mcg at 05/01/20 0606  . metoprolol succinate (TOPROL-XL) 24 hr tablet 12.5 mg  12.5 mg Oral Daily Adefeso, Oladapo, DO   12.5 mg at 05/01/20 1016  . QUEtiapine (SEROQUEL XR) 24 hr tablet 600 mg  600 mg Oral QHS Shah, Pratik D, DO   600 mg at 04/30/20 2212  . venlafaxine XR (EFFEXOR-XR) 24 hr capsule 75 mg  75 mg Oral Q breakfast Heath Lark D, DO   75 mg at 05/01/20 1016  . warfarin  (COUMADIN) tablet 5 mg  5 mg Oral ONCE-1600 Shah, Pratik D, DO      . Warfarin - Pharmacist Dosing Inpatient   Does not apply q1600 Coffee, Donna Christen [facility]         Discharge Medications: Please see discharge summary for a list of discharge medications.  Relevant Imaging Results:  Relevant Lab Results:   Additional Information SS# [id number]  Boneta Lucks, RN

## 2020-05-01 NOTE — TOC Progression Note (Signed)
Transition of Care [facility]) - Progression Note    Patient Details  Name: [name] MRN: [id number] Date of Birth: 24-Sep-1950  Transition [facility]) CM/SW Contact  Boneta Lucks, RN Phone Number: 05/01/2020, 1:50 PM  Clinical Narrative:  [place]ch completed, PT notes completed. Referred out to SNF for bed offers.    Expected Discharge Plan: [place] Barriers to Discharge: Continued Medical Work up  Expected Discharge Plan and Services Expected Discharge Plan: [place] Choice: [place] arrangements for the past 2 months: [place]

## 2020-05-01 NOTE — Progress Notes (Addendum)
ANTICOAGULATION CONSULT NOTE -    Pharmacy Consult for warfarin dosing  Indication: status post mechanical aortic valve    Allergies  Allergen Reactions  . Mucinex [Guaifenesin Er] Other (See Comments)    Severe headaches   . Keflex [Cephalexin] Other (See Comments)    Headaches and dizziness  . Sulfa Antibiotics Other (See Comments)    headaches  . Sulfonamide Derivatives Other (See Comments)    Headaches       Patient Measurements: Last Weight  Most recent update: 04/29/2020  5:50 PM   Weight  92.1 kg (203 lb)           Body mass index is 30.87 kg/m. [name]               Temp: 97.8 F (36.6 C) (12/07 0604) Temp Source: Oral (12/07 0604) BP: 123/52 (12/07 1100) Pulse Rate: 68 (12/07 1100)  Labs: Recent Labs    04/29/20 2007 04/29/20 2007 04/30/20 0616 05/01/20 0510  HGB 12.6   < > 12.1 11.4*  HCT 40.1  --  38.3 36.8  PLT 197  --  210 205  APTT  --   --  42*  --   LABPROT 31.9*  --  32.7* 33.1*  INR 3.2*  --  3.3* 3.4*  CREATININE 1.25*  --  1.20* 1.07*   < > = values in this interval not displayed.    Estimated Creatinine Clearance: 58.9 mL/min (A) (by C-G formula based on SCr of 1.07 mg/dL (H)).     Medications:  Medications Prior to Admission  Medication Sig Dispense Refill Last Dose  . acetaminophen (TYLENOL) 650 MG CR tablet Take 1,300 mg by mouth every 8 (eight) hours as needed for pain.   Past Month at Unknown time  . amiodarone (PACERONE) 200 MG tablet TAKE 1 TABLET BY MOUTH TWICE A DAY (Patient taking differently: Take 200 mg by mouth 2 (two) times daily. ) 180 tablet 3 04/29/2020 at Unknown time  . aspirin EC 81 MG tablet Take 81 mg by mouth daily.   04/29/2020 at Unknown time  . butalbital-acetaminophen-caffeine (FIORICET) 50-325-40 MG tablet Take 1 tablet by mouth as needed for migraine.   unknown  . Cholecalciferol (VITAMIN D3) 1.25 MG (50000 UT) TABS Take 1 tablet by mouth daily.   04/29/2020 at Unknown time  . furosemide (LASIX) 40  MG tablet Take 1 tablet (40 mg total) by mouth daily as needed for edema. 90 tablet 3 04/29/2020 at Unknown time  . lamoTRIgine (LAMICTAL) 100 MG tablet Take 100 mg by mouth in the morning and 200 mg by mouth at bedtime (Patient taking differently: Take 100-200 mg by mouth See admin instructions. Take 100 mg by mouth in the morning and 200 mg by mouth at bedtime) 270 tablet 1 04/29/2020 at Unknown time  . metoprolol succinate (TOPROL XL) 25 MG 24 hr tablet Take 0.5 tablets (12.5 mg total) by mouth daily. 90 tablet 1 04/29/2020 at 0830  . QUEtiapine (SEROQUEL XR) 300 MG 24 hr tablet Take 2 tablets (600 mg total) by mouth every evening. 180 tablet 1 04/28/2020 at Unknown time  . SUMAtriptan (IMITREX) 50 MG tablet TAKE ONE TABLET EVERY 2 HOURS AS NEEDED FOR MIGRAINE. MAY REPEAT IN 2 HOURS IF HEADACHE PERSISTS OR RECURS. (MAX OF 2 TABLETS IN 24 HOURS) (Patient taking differently: Take 50 mg by mouth every 2 (two) hours as needed for migraine. ) 10 tablet 0 unknown  . venlafaxine XR (EFFEXOR XR) 75 MG  24 hr capsule Take 1 capsule (75 mg total) by mouth daily with breakfast. 90 capsule 1 04/29/2020 at Unknown time  . vitamin B-12 1000 MCG tablet Take 1 tablet (1,000 mcg total) by mouth daily. 30 tablet 0 04/29/2020 at Unknown time  . warfarin (COUMADIN) 5 MG tablet Take as directed by [facility]. (Patient taking differently: Take 2.5-5 mg by mouth See admin instructions. Take 2.5mg  on Mondays and Fridays, and 5mg  on all other days.Take every night.) 110 tablet 1 04/28/2020 at 2030  . glucose blood (ONETOUCH VERIO) test strip Check blood sugars once daily Dx: E11.9 100 each 12   . Lancets (ONETOUCH ULTRASOFT) lancets Use as instructed to test blood sugars once daily Dx. E11.65 100 each 12   . nitrofurantoin, macrocrystal-monohydrate, (MACROBID) 100 MG capsule Take 1 capsule (100 mg total) by mouth 2 (two) times daily for 7 days. (Patient not taking: Reported on 04/29/2020) 14 capsule 0 Completed Course at Unknown  time  . ONE TOUCH LANCETS MISC Test once daily to check blood sugar. DX E11.9 100 each 3    Scheduled:  . Chlorhexidine Gluconate Cloth  6 each Topical Daily  . levothyroxine  12.5 mcg Oral Q0600  . metoprolol succinate  12.5 mg Oral Daily  . QUEtiapine  600 mg Oral QHS  . venlafaxine XR  75 mg Oral Q breakfast  . Warfarin - Pharmacist Dosing Inpatient   Does not apply q1600   Infusions:   PRN:  Anti-infectives (From admission, onward)   None      Goal of Therapy:  INR 2.5-3.5 Monitor platelets by anticoagulation protocol: Yes    Prior to Admission Warfarin Dosing:  [name] takes 2.5mg  of warfarin Monday and Friday, 5mg  po all other days      Admit INR was 3.3 Lab Results  Component Value Date   INR 3.4 (H) 05/01/2020   INR 3.3 (H) 04/30/2020   INR 3.2 (H) 04/29/2020    Assessment: [name] a 69 y.o. female requires anticoagulation with warfarin for the indication of  mechanical aortic valve. Warfarin will be initiated inpatient following pharmacy protocol per pharmacy consult. Patient most recent blood work is as follows: CBC Latest Ref Rng & Units 05/01/2020 04/30/2020 04/29/2020  WBC 4.0 - 10.5 K/uL 5.3 5.5 5.4  Hemoglobin 12.0 - 15.0 g/dL 11.4(L) 12.1 12.6  Hematocrit 36 - 46 % 36.8 38.3 40.1  Platelets 150 - 400 K/uL 205 210 197    INR is 3.4, therapeutic  Plan: Warfarin 5 mg po x1 Monitor CBC MWF with am labs   Monitor INR daily Monitor for signs and symptoms of bleeding   Isac Sarna, BS [place], BCPS Clinical Pharmacist Pager [phone]

## 2020-05-01 NOTE — Evaluation (Signed)
Physical Therapy Evaluation Patient Details Name: [name] MRN: [id number] DOB: [birth year] Today's Date: 05/01/2020   History of Present Illness  69 y.o. female with medical history significant for aortic insufficiency status post mechanical valve replacement, afib, CHF, diabetes, migraine headaches, anemia, recurrent UTIs, HTN, hyperlipidemia, gout, depression, bipolar disorder with c/o recurrent multiple falls.  Clinical Impression  Pt admitted with above diagnosis. Pt generally unsteady and weak, requiring assist to rise to standing from EOB and take a few sidesteps with BLE braced on bed. Pt endorses multiple falls PTA and in agreement with rehab to improve strength and balance to reduce risk for falls. Pt currently with functional limitations due to the deficits listed below (see PT Problem List). Pt will benefit from skilled PT to increase their independence and safety with mobility to allow discharge to the venue listed below.        Follow Up Recommendations SNF    Equipment Recommendations  None recommended by PT    Recommendations for Other Services       Precautions / Restrictions Precautions Precautions: Fall Restrictions Weight Bearing Restrictions: No      Mobility  Bed Mobility Overal bed mobility: Needs Assistance Bed Mobility: Supine to Sit;Sit to Supine  Supine to sit: Min assist;HOB elevated Sit to supine: Min assist;HOB elevated   General bed mobility comments: min A with bed mobility, HOB elevated and use of bedrail to assist with trunk mobility, min A to lift BLE back into bed and reposition to comfort    Transfers Overall transfer level: Needs assistance Equipment used: 1 person hand held assist Transfers: Sit to/from Stand Sit to Stand: Mod assist;From elevated surface    General transfer comment: mod A to power up, BUE assisting, generally unsteady with BLE braced on front of bed  Ambulation/Gait Ambulation/Gait assistance: Mod assist   Assistive device: 1 person hand held assist  General Gait Details: pt tolerates 3 sidesteps up to [facility] with HHA, BLE braced on bed, generally unsteady with weight posterior  Stairs            Wheelchair Mobility    Modified Rankin (Stroke Patients Only)       Balance Overall balance assessment: Needs assistance;History of Falls Sitting-balance support: Feet supported;Bilateral upper extremity supported Sitting balance-Leahy Scale: Fair Sitting balance - Comments: seated EOB   Standing balance support: During functional activity;Single extremity supported Standing balance-Leahy Scale: Poor Standing balance comment: reliant on UE support          Pertinent Vitals/Pain Pain Assessment: No/denies pain    Home Living Family/patient expects to be discharged to:: Private residence Living Arrangements: Children Available Help at Discharge: Family;Available 24 hours/day Type of Home: House Home Access: Level entry     Home Layout: Multi-level;Other (Comment) (pt and 2 grandchildren (91yo, 21yo) bedrooms in basement) Home Equipment: Walker - 4 wheels;Walker - 2 wheels;Wheelchair - power;Bedside commode;Shower seat      Prior Function Level of Independence: Needs assistance   Gait / Transfers Assistance Needed: household ambulator with rollator walker and family providing steadying assist, WC in community  ADL's / Homemaking Assistance Needed: daughter assists with bathing and dressing, daughter completes cooking and cleaning  Comments: family provides transportation, pt endorses multiple falls     Hand Dominance   Dominant Hand: Right    Extremity/Trunk Assessment   Upper Extremity Assessment Upper Extremity Assessment: Generalized weakness    Lower Extremity Assessment Lower Extremity Assessment: Generalized weakness (AROM WNL, strength 4/5 RLE throughout and 3+/5 LLE  throughout, denies numbness/tingling)    Cervical / Trunk Assessment Cervical / Trunk  Assessment: Normal  Communication   Communication: No difficulties  Cognition Arousal/Alertness: Awake/alert Behavior During Therapy: WFL for tasks assessed/performed Overall Cognitive Status: Within Functional Limits for tasks assessed         General Comments      Exercises     Assessment/Plan    PT Assessment Patient needs continued PT services  PT Problem List Decreased strength;Decreased range of motion;Decreased activity tolerance;Decreased balance;Decreased mobility;Decreased knowledge of use of DME;Decreased safety awareness;Cardiopulmonary status limiting activity;Obesity       PT Treatment Interventions DME instruction;Gait training;Functional mobility training;Therapeutic activities;Therapeutic exercise;Balance training;Neuromuscular re-education;Patient/family education    PT Goals (Current goals can be found in the Care Plan section)  Acute Rehab PT Goals Patient Stated Goal: "rehab to get stronger, then maybe ALF or home" PT Goal Formulation: With patient Time For Goal Achievement: 05/15/20 Potential to Achieve Goals: Good    Frequency Min 2X/week   Barriers to discharge        Co-evaluation               AM-PAC PT "6 Clicks" Mobility  Outcome Measure Help needed turning from your back to your side while in a flat bed without using bedrails?: A Little Help needed moving from lying on your back to sitting on the side of a flat bed without using bedrails?: A Little Help needed moving to and from a bed to a chair (including a wheelchair)?: A Lot Help needed standing up from a chair using your arms (e.g., wheelchair or bedside chair)?: A Lot Help needed to walk in hospital room?: A Lot Help needed climbing 3-5 steps with a railing? : Total 6 Click Score: 13    End of Session Equipment Utilized During Treatment: Gait belt Activity Tolerance: Patient tolerated treatment well Patient left: in bed;with call bell/phone within reach Nurse Communication:  Mobility status PT Visit Diagnosis: Unsteadiness on feet (R26.81);Other abnormalities of gait and mobility (R26.89);Muscle weakness (generalized) (M62.81);History of falling (Z91.81)    Time: 1583-0940 PT Time Calculation (min) (ACUTE ONLY): 33 min   Charges:   PT Evaluation $PT Eval Moderate Complexity: 1 Mod PT Treatments $Therapeutic Activity: 8-22 mins         Tori Falen Lehrmann PT, DPT 05/01/20, 1:04 PM [phone]

## 2020-05-02 LAB — PROTIME-INR
INR: 3.9 — ABNORMAL HIGH (ref 0.8–1.2)
Prothrombin Time: 37.4 seconds — ABNORMAL HIGH (ref 11.4–15.2)

## 2020-05-02 LAB — CBC
HCT: 35.7 % — ABNORMAL LOW (ref 36.0–46.0)
Hemoglobin: 11 g/dL — ABNORMAL LOW (ref 12.0–15.0)
MCH: 29.8 pg (ref 26.0–34.0)
MCHC: 30.8 g/dL (ref 30.0–36.0)
MCV: 96.7 fL (ref 80.0–100.0)
Platelets: 195 10*3/uL (ref 150–400)
RBC: 3.69 MIL/uL — ABNORMAL LOW (ref 3.87–5.11)
RDW: 14.1 % (ref 11.5–15.5)
WBC: 5.4 10*3/uL (ref 4.0–10.5)
nRBC: 0 % (ref 0.0–0.2)

## 2020-05-02 NOTE — Care Management Important Message (Signed)
Important Message  Patient Details  Name: [name] MRN: [id number] Date of Birth: 12/10/1950   Medicare Important Message Given:  Yes     [name] 05/02/2020, 2:24 PM

## 2020-05-02 NOTE — Progress Notes (Signed)
ANTICOAGULATION CONSULT NOTE -    Pharmacy Consult for warfarin dosing  Indication: status post mechanical aortic valve    Allergies  Allergen Reactions  . Mucinex [Guaifenesin Er] Other (See Comments)    Severe headaches   . Keflex [Cephalexin] Other (See Comments)    Headaches and dizziness  . Sulfa Antibiotics Other (See Comments)    headaches  . Sulfonamide Derivatives Other (See Comments)    Headaches       Patient Measurements: Last Weight  Most recent update: 04/29/2020  5:50 PM   Weight  92.1 kg (203 lb)           Body mass index is 30.87 kg/m. [name]               Temp: 97.5 F (36.4 C) (12/08 0814) Temp Source: Oral (12/08 0814) BP: 97/43 (12/08 0814) Pulse Rate: 63 (12/08 0642)  Labs: Recent Labs    04/29/20 2007 04/29/20 2007 04/30/20 0616 04/30/20 0616 05/01/20 0510 05/02/20 0308  HGB 12.6   < > 12.1   < > 11.4* 11.0*  HCT 40.1   < > 38.3  --  36.8 35.7*  PLT 197   < > 210  --  205 195  APTT  --   --  42*  --   --   --   LABPROT 31.9*   < > 32.7*  --  33.1* 37.4*  INR 3.2*   < > 3.3*  --  3.4* 3.9*  CREATININE 1.25*  --  1.20*  --  1.07*  --    < > = values in this interval not displayed.    Estimated Creatinine Clearance: 58.9 mL/min (A) (by C-G formula based on SCr of 1.07 mg/dL (H)).     Medications:  Medications Prior to Admission  Medication Sig Dispense Refill Last Dose  . acetaminophen (TYLENOL) 650 MG CR tablet Take 1,300 mg by mouth every 8 (eight) hours as needed for pain.   Past Month at Unknown time  . amiodarone (PACERONE) 200 MG tablet TAKE 1 TABLET BY MOUTH TWICE A DAY (Patient taking differently: Take 200 mg by mouth 2 (two) times daily. ) 180 tablet 3 04/29/2020 at Unknown time  . aspirin EC 81 MG tablet Take 81 mg by mouth daily.   04/29/2020 at Unknown time  . butalbital-acetaminophen-caffeine (FIORICET) 50-325-40 MG tablet Take 1 tablet by mouth as needed for migraine.   unknown  . Cholecalciferol (VITAMIN D3)  1.25 MG (50000 UT) TABS Take 1 tablet by mouth daily.   04/29/2020 at Unknown time  . furosemide (LASIX) 40 MG tablet Take 1 tablet (40 mg total) by mouth daily as needed for edema. 90 tablet 3 04/29/2020 at Unknown time  . lamoTRIgine (LAMICTAL) 100 MG tablet Take 100 mg by mouth in the morning and 200 mg by mouth at bedtime (Patient taking differently: Take 100-200 mg by mouth See admin instructions. Take 100 mg by mouth in the morning and 200 mg by mouth at bedtime) 270 tablet 1 04/29/2020 at Unknown time  . metoprolol succinate (TOPROL XL) 25 MG 24 hr tablet Take 0.5 tablets (12.5 mg total) by mouth daily. 90 tablet 1 04/29/2020 at 0830  . QUEtiapine (SEROQUEL XR) 300 MG 24 hr tablet Take 2 tablets (600 mg total) by mouth every evening. 180 tablet 1 04/28/2020 at Unknown time  . SUMAtriptan (IMITREX) 50 MG tablet TAKE ONE TABLET EVERY 2 HOURS AS NEEDED FOR MIGRAINE. MAY REPEAT IN 2 HOURS  IF HEADACHE PERSISTS OR RECURS. (MAX OF 2 TABLETS IN 24 HOURS) (Patient taking differently: Take 50 mg by mouth every 2 (two) hours as needed for migraine. ) 10 tablet 0 unknown  . venlafaxine XR (EFFEXOR XR) 75 MG 24 hr capsule Take 1 capsule (75 mg total) by mouth daily with breakfast. 90 capsule 1 04/29/2020 at Unknown time  . vitamin B-12 1000 MCG tablet Take 1 tablet (1,000 mcg total) by mouth daily. 30 tablet 0 04/29/2020 at Unknown time  . warfarin (COUMADIN) 5 MG tablet Take as directed by Coumadin Clinic. (Patient taking differently: Take 2.5-5 mg by mouth See admin instructions. Take 2.5mg  on Mondays and Fridays, and 5mg  on all other days.Take every night.) 110 tablet 1 04/28/2020 at 2030  . glucose blood (ONETOUCH VERIO) test strip Check blood sugars once daily Dx: E11.9 100 each 12   . Lancets (ONETOUCH ULTRASOFT) lancets Use as instructed to test blood sugars once daily Dx. E11.65 100 each 12   . nitrofurantoin, macrocrystal-monohydrate, (MACROBID) 100 MG capsule Take 1 capsule (100 mg total) by mouth 2 (two)  times daily for 7 days. (Patient not taking: Reported on 04/29/2020) 14 capsule 0 Completed Course at Unknown time  . ONE TOUCH LANCETS MISC Test once daily to check blood sugar. DX E11.9 100 each 3    Scheduled:  . amiodarone  200 mg Oral BID  . Chlorhexidine Gluconate Cloth  6 each Topical Daily  . levothyroxine  12.5 mcg Oral Q0600  . metoprolol succinate  12.5 mg Oral Daily  . QUEtiapine  600 mg Oral QHS  . venlafaxine XR  75 mg Oral Q breakfast  . Warfarin - Pharmacist Dosing Inpatient   Does not apply q1600   Infusions:   PRN:  Anti-infectives (From admission, onward)   None      Goal of Therapy:  INR 2.5-3.5 Monitor platelets by anticoagulation protocol: Yes    Prior to Admission Warfarin Dosing:  [name] takes 2.5mg  of warfarin Monday and Friday, 5mg  po all other days      Admit INR was 3.3 Lab Results  Component Value Date   INR 3.9 (H) 05/02/2020   INR 3.4 (H) 05/01/2020   INR 3.3 (H) 04/30/2020    Assessment: [name] a 69 y.o. female requires anticoagulation with warfarin for the indication of  mechanical aortic valve. Warfarin will be initiated inpatient following pharmacy protocol per pharmacy consult. Patient most recent blood work is as follows: CBC Latest Ref Rng & Units 05/02/2020 05/01/2020 04/30/2020  WBC 4.0 - 10.5 K/uL 5.4 5.3 5.5  Hemoglobin 12.0 - 15.0 g/dL 11.0(L) 11.4(L) 12.1  Hematocrit 36 - 46 % 35.7(L) 36.8 38.3  Platelets 150 - 400 K/uL 195 205 210    INR is 3.4>3.9 - supratherapeutic  Plan: Hold warfarin x 1 dose. Monitor CBC MWF with am labs   Monitor INR daily Monitor for signs and symptoms of bleeding   Margot Ables, PharmD Clinical Pharmacist 05/02/2020 8:35 AM

## 2020-05-02 NOTE — TOC Progression Note (Signed)
Transition of Care [facility]) - Progression Note    Patient Details  Name: [name] MRN: [id number] Date of Birth: 1951/01/28  Transition of Care [facility]) CM/SW Contact  Shade Flood, LCSW Phone Number: 05/02/2020, 3:30 PM  Clinical Narrative:     TOC following. Pt's insurance has denied SNF rehab and Peer to Peer appeal. Pt and daughter updated. They request return home with continued HH from [facility]. Plan is for dc tomorrow. Updated Linda at [place].  Will follow up tomorrow.  Expected Discharge Plan: [place] Barriers to Discharge: Continued Medical Work up  Expected Discharge Plan and Services Expected Discharge Plan: [place] Choice: [place] arrangements for the past 2 months: Single Family Home Expected Discharge Date: 05/02/20                                     Social Determinants of Health (SDOH) Interventions    Readmission Risk Interventions No flowsheet data found.

## 2020-05-02 NOTE — Progress Notes (Signed)
OT Cancellation Note  Patient Details Name: PEGAH SEGEL MRN: [id number] DOB: August 15, 1950   Cancelled Treatment:    Reason Eval/Treat Not Completed: Other (comment) Pt with discharge summary in chart, plan is for to discharge to [facility] today. Defer OT evaluation to next venue of care.    Guadelupe Sabin, OTR/L  [phone] 05/02/2020, 7:14 AM

## 2020-05-03 ENCOUNTER — Telehealth: Payer: Self-pay | Admitting: Internal Medicine

## 2020-05-03 ENCOUNTER — Other Ambulatory Visit: Payer: Self-pay

## 2020-05-03 ENCOUNTER — Encounter: Payer: Self-pay | Admitting: Family Medicine

## 2020-05-03 ENCOUNTER — Ambulatory Visit (INDEPENDENT_AMBULATORY_CARE_PROVIDER_SITE_OTHER): Payer: PPO | Admitting: Family Medicine

## 2020-05-03 VITALS — BP 122/70 | HR 65 | Temp 96.6°F | Resp 16 | Wt 210.0 lb

## 2020-05-03 DIAGNOSIS — N289 Disorder of kidney and ureter, unspecified: Secondary | ICD-10-CM

## 2020-05-03 DIAGNOSIS — R296 Repeated falls: Secondary | ICD-10-CM

## 2020-05-03 DIAGNOSIS — E1142 Type 2 diabetes mellitus with diabetic polyneuropathy: Secondary | ICD-10-CM | POA: Diagnosis not present

## 2020-05-03 DIAGNOSIS — L02416 Cutaneous abscess of left lower limb: Secondary | ICD-10-CM

## 2020-05-03 DIAGNOSIS — F319 Bipolar disorder, unspecified: Secondary | ICD-10-CM | POA: Diagnosis not present

## 2020-05-03 DIAGNOSIS — L03116 Cellulitis of left lower limb: Secondary | ICD-10-CM

## 2020-05-03 DIAGNOSIS — E538 Deficiency of other specified B group vitamins: Secondary | ICD-10-CM

## 2020-05-03 DIAGNOSIS — F3181 Bipolar II disorder: Secondary | ICD-10-CM

## 2020-05-03 DIAGNOSIS — Z23 Encounter for immunization: Secondary | ICD-10-CM

## 2020-05-03 DIAGNOSIS — I482 Chronic atrial fibrillation, unspecified: Secondary | ICD-10-CM

## 2020-05-03 DIAGNOSIS — R791 Abnormal coagulation profile: Secondary | ICD-10-CM | POA: Diagnosis not present

## 2020-05-03 LAB — PROTIME-INR
INR: 4.3 (ref 0.8–1.2)
Prothrombin Time: 40.1 seconds — ABNORMAL HIGH (ref 11.4–15.2)

## 2020-05-03 MED ORDER — ONETOUCH VERIO VI STRP: ORAL_STRIP | 12 refills | Status: DC

## 2020-05-03 MED ORDER — ONETOUCH ULTRASOFT LANCETS MISC
12 refills | Status: DC
Start: 2020-05-03 — End: 2021-06-27

## 2020-05-03 MED ORDER — NYSTATIN 100000 UNIT/GM EX CREA: 1.0000 "application " | TOPICAL_CREAM | Freq: Two times a day (BID) | CUTANEOUS | 1 refills | Status: DC

## 2020-05-03 MED ORDER — AMOXICILLIN-POT CLAVULANATE 875-125 MG PO TABS: 1.0000 | ORAL_TABLET | Freq: Two times a day (BID) | ORAL | 0 refills | Status: DC

## 2020-05-03 NOTE — Telephone Encounter (Signed)
[name] AWARE PT NEEDS TO KEEP BOTH APPTS FOR COVID SCREENING AND INR TO BE CHECKED ON Monday PRIOR TO DCCV ON 05/09/20 .Adonis Housekeeper

## 2020-05-03 NOTE — Patient Instructions (Addendum)
Recommend calcium intake of 1200 to 1500 mg daily, divided into roughly 3 doses. Best source is the diet and a single dairy serving is about 500 mg, a supplement of calcium citrate once or twice daily to balance diet is fine if not getting enough in diet. Also need Vitamin D 2000 IU caps, 1 cap daily if not already taking vitamin D. Also recommend weight baring exercise on hips and upper body to keep bones strong.  [employer] supplements and probiotic Cellulitis, Adult  Cellulitis is a skin infection. The infected area is usually warm, red, swollen, and tender. This condition occurs most often in the arms and lower legs. The infection can travel to the muscles, blood, and underlying tissue and become serious. It is very important to get treated for this condition. What are the causes? Cellulitis is caused by bacteria. The bacteria enter through a break in the skin, such as a cut, burn, insect bite, open sore, or crack. What increases the risk? This condition is more likely to occur in people who:  Have a weak body defense system (immune system).  Have open wounds on the skin, such as cuts, burns, bites, and scrapes. Bacteria can enter the body through these open wounds.  Are older than 69 years of age.  Have diabetes.  Have a type of long-lasting (chronic) liver disease (cirrhosis) or kidney disease.  Are obese.  Have a skin condition such as: ? Itchy rash (eczema). ? Slow movement of blood in the veins (venous stasis). ? Fluid buildup below the skin (edema).  Have had radiation therapy.  Use IV drugs. What are the signs or symptoms? Symptoms of this condition include:  Redness, streaking, or spotting on the skin.  Swollen area of the skin.  Tenderness or pain when an area of the skin is touched.  Warm skin.  A fever.  Chills.  Blisters. How is this diagnosed? This condition is diagnosed based on a medical history and physical exam. You may also have tests,  including:  Blood tests.  Imaging tests. How is this treated? Treatment for this condition may include:  Medicines, such as antibiotic medicines or medicines to treat allergies (antihistamines).  Supportive care, such as rest and application of cold or warm cloths (compresses) to the skin.  Hospital care, if the condition is severe. The infection usually starts to get better within 1-2 days of treatment. Follow these instructions at home:  Medicines  Take over-the-counter and prescription medicines only as told by your health care provider.  If you were prescribed an antibiotic medicine, take it as told by your health care provider. Do not stop taking the antibiotic even if you start to feel better. General instructions  Drink enough fluid to keep your urine pale yellow.  Do not touch or rub the infected area.  Raise (elevate) the infected area above the level of your heart while you are sitting or lying down.  Apply warm or cold compresses to the affected area as told by your health care provider.  Keep all follow-up visits as told by your health care provider. This is important. These visits let your health care provider make sure a more serious infection is not developing. Contact a health care provider if:  You have a fever.  Your symptoms do not begin to improve within 1-2 days of starting treatment.  Your bone or joint underneath the infected area becomes painful after the skin has healed.  Your infection returns in the same area or  another area.  You notice a swollen bump in the infected area.  You develop new symptoms.  You have a general ill feeling (malaise) with muscle aches and pains. Get help right away if:  Your symptoms get worse.  You feel very sleepy.  You develop vomiting or diarrhea that persists.  You notice red streaks coming from the infected area.  Your red area gets larger or turns dark in color. These symptoms may represent a serious  problem that is an emergency. Do not wait to see if the symptoms will go away. Get medical help right away. Call your local emergency services (911 in the [place] Do not drive yourself to the hospital. Summary  Cellulitis is a skin infection. This condition occurs most often in the arms and lower legs.  Treatment for this condition may include medicines, such as antibiotic medicines or antihistamines.  Take over-the-counter and prescription medicines only as told by your health care provider. If you were prescribed an antibiotic medicine, do not stop taking the antibiotic even if you start to feel better.  Contact a health care provider if your symptoms do not begin to improve within 1-2 days of starting treatment or your symptoms get worse.  Keep all follow-up visits as told by your health care provider. This is important. These visits let your health care provider make sure that a more serious infection is not developing. This information is not intended to replace advice given to you by your health care provider. Make sure you discuss any questions you have with your health care provider. Document Revised: 10/01/2017 Document Reviewed: 10/01/2017 Elsevier Patient Education  [place].

## 2020-05-03 NOTE — Discharge Instructions (Signed)
PLEASE HOLD WARFARIN FOR NEXT 2 DAYS AND THEN RESUME AS NORMAL.  PLEASE FOLLOW UP WITH THE WARFARIN (COUMADIN) CLINIC AS SOON AS POSSIBLE TO HAVE PT/INR RECHECKED.  HOME HEALTH RN TO CHECK PT/INR AND SEND RESULTS TO [place] AND PCP.      IMPORTANT INFORMATION: PAY CLOSE ATTENTION   PHYSICIAN DISCHARGE INSTRUCTIONS  Follow with Primary care provider  Mosie Lukes, MD  and other consultants as instructed by your Hospitalist Physician  [place] IF SYMPTOMS COME BACK, WORSEN OR NEW PROBLEM DEVELOPS   Please note: You were cared for by a hospitalist during your hospital stay. Every effort will be made to forward records to your primary care provider.  You can request that your primary care provider send for your hospital records if they have not received them.  Once you are discharged, your primary care physician will handle any further medical issues. Please note that NO REFILLS for any discharge medications will be authorized once you are discharged, as it is imperative that you return to your primary care physician (or establish a relationship with a primary care physician if you do not have one) for your post hospital discharge needs so that they can reassess your need for medications and monitor your lab values.  Please get a complete blood count and chemistry panel checked by your Primary MD at your next visit, and again as instructed by your Primary MD.  Get Medicines reviewed and adjusted: Please take all your medications with you for your next visit with your Primary MD  Laboratory/radiological data: Please request your Primary MD to go over all hospital tests and procedure/radiological results at the follow up, please ask your primary care provider to get all Hospital records sent to his/her office.  In some cases, they will be blood work, cultures and biopsy results pending at the time of your discharge. Please request that  your primary care provider follow up on these results.  If you are diabetic, please bring your blood sugar readings with you to your follow up appointment with primary care.    Please call and make your follow up appointments as soon as possible.    Also Note the following: If you experience worsening of your admission symptoms, develop shortness of breath, life threatening emergency, suicidal or homicidal thoughts you must seek medical attention immediately by calling 911 or calling your MD immediately  if symptoms less severe.  You must read complete instructions/literature along with all the possible adverse reactions/side effects for all the Medicines you take and that have been prescribed to you. Take any new Medicines after you have completely understood and accpet all the possible adverse reactions/side effects.   Do not drive when taking Pain medications or sleeping medications (Benzodiazepines)  Do not take more than prescribed Pain, Sleep and Anxiety Medications. It is not advisable to combine anxiety,sleep and pain medications without talking with your primary care practitioner  Special Instructions: If you have smoked or chewed Tobacco  in the last 2 yrs please stop smoking, stop any regular Alcohol  and or any Recreational drug use.  Wear Seat belts while driving.  Do not drive if taking any narcotic, mind altering or controlled substances or recreational drugs or alcohol.

## 2020-05-03 NOTE — Telephone Encounter (Signed)
Returned call to patient's daughter, Amy. She stated she needed to speak with Dr Tanna Furry nurse, not the Coumadin clinic. Will forward message to Pauline.

## 2020-05-03 NOTE — TOC Transition Note (Signed)
Transition of Care [facility]) - CM/SW Discharge Note   Patient Details  Name: [name] MRN: [id number] Date of Birth: 26-Jan-1951  Transition of Care [facility]) CM/SW Contact:  Shade Flood, LCSW Phone Number: 05/03/2020, 11:00 AM   Clinical Narrative:     Pt stable for dc today per MD. Plan remains for dc home with resumption of [facility]. Updated Linda this AM. [name] will be picking up pt around noon. There are no other TOC needs for dc.  Final next level of care: [place] Barriers to Discharge: Barriers Resolved   Patient Goals and CMS Choice Patient states their goals for this hospitalization and ongoing recovery are:: to go to rehab. CMS Medicare.gov Compare Post Acute Care list provided to:: Patient Represenative (must comment) Choice offered to / list presented to : Adult Children  Discharge Placement                       Discharge Plan and Services     Post Acute Care Choice: Skilled Nursing Facility                               Social Determinants of Health (SDOH) Interventions     Readmission Risk Interventions Readmission Risk Prevention Plan 05/03/2020  Transportation Screening Complete  Home Care Screening Complete  Medication Review (RN CM) Complete  Some recent data might be hidden

## 2020-05-03 NOTE — Progress Notes (Signed)
Discharge instructions reviewed with patient and patients daughter. Patient discharged home with daughter in stable condition.

## 2020-05-03 NOTE — Telephone Encounter (Signed)
Patient's daughter called wanting to talk with Dr. Lovena Le or nurse. Please call back

## 2020-05-03 NOTE — Telephone Encounter (Signed)
Patient's daughter states the patient has been in the hospital and has a cardioversion scheduled. She states she would like to speak with a nurse about the patient's recent coumadin check.

## 2020-05-03 NOTE — Progress Notes (Signed)
ANTICOAGULATION CONSULT NOTE -    Pharmacy Consult for warfarin dosing  Indication: status post mechanical aortic valve    Allergies  Allergen Reactions  . Mucinex [Guaifenesin Er] Other (See Comments)    Severe headaches   . Keflex [Cephalexin] Other (See Comments)    Headaches and dizziness  . Sulfa Antibiotics Other (See Comments)    headaches  . Sulfonamide Derivatives Other (See Comments)    Headaches       Patient Measurements: Last Weight  Most recent update: 04/29/2020  5:50 PM   Weight  92.1 kg (203 lb)           Body mass index is 30.87 kg/m. [name]               Temp: 97.4 F (36.3 C) (12/09 0752) Temp Source: Oral (12/09 0752) BP: 117/64 (12/09 0650) Pulse Rate: 62 (12/09 0650)  Labs: Recent Labs    05/01/20 0510 05/02/20 0308 05/03/20 0406  HGB 11.4* 11.0*  --   HCT 36.8 35.7*  --   PLT 205 195  --   LABPROT 33.1* 37.4* 40.1*  INR 3.4* 3.9* 4.3*  CREATININE 1.07*  --   --     Estimated Creatinine Clearance: 58.9 mL/min (A) (by C-G formula based on SCr of 1.07 mg/dL (H)).     Medications:  Medications Prior to Admission  Medication Sig Dispense Refill Last Dose  . acetaminophen (TYLENOL) 650 MG CR tablet Take 1,300 mg by mouth every 8 (eight) hours as needed for pain.   Past Month at Unknown time  . amiodarone (PACERONE) 200 MG tablet TAKE 1 TABLET BY MOUTH TWICE A DAY (Patient taking differently: Take 200 mg by mouth 2 (two) times daily. ) 180 tablet 3 04/29/2020 at Unknown time  . aspirin EC 81 MG tablet Take 81 mg by mouth daily.   04/29/2020 at Unknown time  . butalbital-acetaminophen-caffeine (FIORICET) 50-325-40 MG tablet Take 1 tablet by mouth as needed for migraine.   unknown  . Cholecalciferol (VITAMIN D3) 1.25 MG (50000 UT) TABS Take 1 tablet by mouth daily.   04/29/2020 at Unknown time  . furosemide (LASIX) 40 MG tablet Take 1 tablet (40 mg total) by mouth daily as needed for edema. 90 tablet 3 04/29/2020 at Unknown time  .  lamoTRIgine (LAMICTAL) 100 MG tablet Take 100 mg by mouth in the morning and 200 mg by mouth at bedtime (Patient taking differently: Take 100-200 mg by mouth See admin instructions. Take 100 mg by mouth in the morning and 200 mg by mouth at bedtime) 270 tablet 1 04/29/2020 at Unknown time  . metoprolol succinate (TOPROL XL) 25 MG 24 hr tablet Take 0.5 tablets (12.5 mg total) by mouth daily. 90 tablet 1 04/29/2020 at 0830  . QUEtiapine (SEROQUEL XR) 300 MG 24 hr tablet Take 2 tablets (600 mg total) by mouth every evening. 180 tablet 1 04/28/2020 at Unknown time  . SUMAtriptan (IMITREX) 50 MG tablet TAKE ONE TABLET EVERY 2 HOURS AS NEEDED FOR MIGRAINE. MAY REPEAT IN 2 HOURS IF HEADACHE PERSISTS OR RECURS. (MAX OF 2 TABLETS IN 24 HOURS) (Patient taking differently: Take 50 mg by mouth every 2 (two) hours as needed for migraine. ) 10 tablet 0 unknown  . venlafaxine XR (EFFEXOR XR) 75 MG 24 hr capsule Take 1 capsule (75 mg total) by mouth daily with breakfast. 90 capsule 1 04/29/2020 at Unknown time  . vitamin B-12 1000 MCG tablet Take 1 tablet (1,000 mcg total)  by mouth daily. 30 tablet 0 04/29/2020 at Unknown time  . warfarin (COUMADIN) 5 MG tablet Take as directed by Coumadin Clinic. (Patient taking differently: Take 2.5-5 mg by mouth See admin instructions. Take 2.5mg  on Mondays and Fridays, and 5mg  on all other days.Take every night.) 110 tablet 1 04/28/2020 at 2030  . glucose blood (ONETOUCH VERIO) test strip Check blood sugars once daily Dx: E11.9 100 each 12   . Lancets (ONETOUCH ULTRASOFT) lancets Use as instructed to test blood sugars once daily Dx. E11.65 100 each 12   . nitrofurantoin, macrocrystal-monohydrate, (MACROBID) 100 MG capsule Take 1 capsule (100 mg total) by mouth 2 (two) times daily for 7 days. (Patient not taking: Reported on 04/29/2020) 14 capsule 0 Completed Course at Unknown time  . ONE TOUCH LANCETS MISC Test once daily to check blood sugar. DX E11.9 100 each 3    Scheduled:  .  amiodarone  200 mg Oral BID  . Chlorhexidine Gluconate Cloth  6 each Topical Daily  . levothyroxine  12.5 mcg Oral Q0600  . metoprolol succinate  12.5 mg Oral Daily  . QUEtiapine  600 mg Oral QHS  . venlafaxine XR  75 mg Oral Q breakfast  . Warfarin - Pharmacist Dosing Inpatient   Does not apply q1600   Infusions:   PRN:  Anti-infectives (From admission, onward)   None      Goal of Therapy:  INR 2.5-3.5 Monitor platelets by anticoagulation protocol: Yes    Prior to Admission Warfarin Dosing:  [name] takes 2.5mg  of warfarin Monday and Friday, 5mg  po all other days      Admit INR was 3.3 Lab Results  Component Value Date   INR 4.3 (HH) 05/03/2020   INR 3.9 (H) 05/02/2020   INR 3.4 (H) 05/01/2020    Assessment: [name] a 69 y.o. female requires anticoagulation with warfarin for the indication of  mechanical aortic valve. Warfarin will be initiated inpatient following pharmacy protocol per pharmacy consult. Patient most recent blood work is as follows: CBC Latest Ref Rng & Units 05/02/2020 05/01/2020 04/30/2020  WBC 4.0 - 10.5 K/uL 5.4 5.3 5.5  Hemoglobin 12.0 - 15.0 g/dL 11.0(L) 11.4(L) 12.1  Hematocrit 36.0 - 46.0 % 35.7(L) 36.8 38.3  Platelets 150 - 400 K/uL 195 205 210    INR is 3.4>3.9>4.3 - supratherapeutic  Plan: Hold warfarin x 1 dose. Monitor CBC MWF with am labs   Monitor INR daily Monitor for signs and symptoms of bleeding   Margot Ables, PharmD Clinical Pharmacist 05/03/2020 9:15 AM

## 2020-05-04 ENCOUNTER — Other Ambulatory Visit: Payer: Self-pay | Admitting: Family Medicine

## 2020-05-04 DIAGNOSIS — R400 Somnolence: Secondary | ICD-10-CM

## 2020-05-04 NOTE — Progress Notes (Signed)
HH orders re-entered.

## 2020-05-04 NOTE — Telephone Encounter (Signed)
See phone note from 05/03/20 for resolution of issue.

## 2020-05-06 DIAGNOSIS — E538 Deficiency of other specified B group vitamins: Secondary | ICD-10-CM | POA: Insufficient documentation

## 2020-05-06 NOTE — Assessment & Plan Note (Signed)
Started on Augmentin and she will notify us if no resolution or worsens. Lesions have been persistent so she may warrant vascular consultation

## 2020-05-06 NOTE — Progress Notes (Signed)
Subjective:    Patient ID: [name], female    DOB: [birth year], 69 y.o.   MRN: [id number]  Chief Complaint  Patient presents with  . Hospitalization Follow-up    HPI Patient is in today for follow up of recent hospitalizations and chronic medical concerns. She is accompanied by her daughter who has become her caretaker and they have come directly from the hospital after being discharged. She was hospitalized for recurrent falls over a five day period without obvious etiology. No single cause was found but she does note feeling some better today. Denies CP/SOB/HA/congestion/fevers/GI or GU c/o. Taking meds as prescribed  Past Medical History:  Diagnosis Date  . ANEMIA 05/28/2010  . AORTIC VALVE REPLACEMENT, HX OF 03/11/2010   Mechanical prosthesis  . BIPOLAR AFFECTIVE DISORDER 03/11/2010  . Depression   . Diverticulitis 12/18/2010  . FIBROIDS, UTERUS 03/11/2010  . Gout 09/04/2016  . Grave's disease 8-12  . Hyperlipidemia associated with type 2 diabetes mellitus ([place]) 06/05/2016  . Hypertension   . Low back pain 11/02/2017  . Migraine 06/05/2016  . Mixed hyperlipidemia 03/11/2010  . Obesity   . Skin cancer   . Syncope 08/22/2019  . UTI (lower urinary tract infection) 08/14/2011    Past Surgical History:  Procedure Laterality Date  . ABDOMINAL HYSTERECTOMY  ?1998   sb/l spo, total for fibroids/heavy bleeding  . ANKLE SURGERY Left 01/2018   hardware removal  . AORTIC VALVE REPLACEMENT  2011   Mechanical prosthesis, [employer]  . APPENDECTOMY     Required revision for EColi infection, required recurrent packing  . bowel obstruction     Requiring adhesions to be removed  . CHOLECYSTECTOMY    . COLONOSCOPY WITH PROPOFOL N/A 11/01/2015   Procedure: COLONOSCOPY WITH PROPOFOL;  Surgeon: Milus Banister, MD;  Location: [facility];  Service: Endoscopy;  Laterality: N/A;  . CYSTOCELE REPAIR N/A 10/16/2015   Procedure: ANTERIOR REPAIR (CYSTOCELE);  Surgeon: Donnamae Jude, MD;   Location: [facility];  Service: Gynecology;  Laterality: N/A;  . HERNIA REPAIR  08-16-10  . LEFT HEART CATHETERIZATION WITH CORONARY ANGIOGRAM N/A 12/05/2011   Procedure: LEFT HEART CATHETERIZATION WITH CORONARY ANGIOGRAM;  Surgeon: Sinclair Grooms, MD;  Location: [facility] CATH LAB;  Service: Cardiovascular;  Laterality: N/A;  . PACEMAKER IMPLANT N/A 01/09/2020   Procedure: PACEMAKER IMPLANT;  Surgeon: Evans Lance, MD;  Location: [facility];  Service: Cardiovascular;  Laterality: N/A;  . TOTAL KNEE ARTHROPLASTY Right 02/24/2013   Procedure: RIGHT TOTAL KNEE ARTHROPLASTY;  Surgeon: Johnn Hai, MD;  Location: [facility];  Service: Orthopedics;  Laterality: Right;  . TUBAL LIGATION    . varicose vein surgery b/l legs      Family History  Problem Relation Age of Onset  . Scoliosis Mother   . Arthritis Mother        Rheumatoid  . Aneurysm Mother        heart  . Alcohol abuse Father   . Depression Sister   . Depression Brother   . Alcohol abuse Brother   . Cancer Maternal Grandmother        colon  . Diabetes Maternal Grandfather   . Heart disease Maternal Grandfather   . Alcohol abuse Maternal Grandfather   . Depression Paternal Grandmother   . Diabetes Paternal Grandmother   . Depression Paternal Grandfather   . Diabetes Paternal Grandfather   . Depression Sister   . Alcohol abuse Brother   . Depression Brother   .  Heart disease Brother     Social History   Socioeconomic History  . Marital status: Married    Spouse name: Not on file  . Number of children: Not on file  . Years of education: Not on file  . Highest education level: Not on file  Occupational History  . Not on file  Tobacco Use  . Smoking status: Former Smoker    Quit date: 05/26/1990    Years since quitting: 29.9  . Smokeless tobacco: Never Used  Vaping Use  . Vaping Use: Never used  Substance and Sexual Activity  . Alcohol use: Yes    Alcohol/week: 0.0 standard drinks    Comment: rare  . Drug use: No   . Sexual activity: Not Currently  Other Topics Concern  . Not on file  Social History Narrative  . Not on file   Social Determinants of Health   Financial Resource Strain: Not on file  Food Insecurity: Not on file  Transportation Needs: Not on file  Physical Activity: Not on file  Stress: Not on file  Social Connections: Not on file  Intimate Partner Violence: Not on file    Outpatient Medications Prior to Visit  Medication Sig Dispense Refill  . acetaminophen (TYLENOL) 650 MG CR tablet Take 1,300 mg by mouth every 8 (eight) hours as needed for pain.    Marland Kitchen amiodarone (PACERONE) 200 MG tablet TAKE 1 TABLET BY MOUTH TWICE A DAY (Patient taking differently: Take 200 mg by mouth 2 (two) times daily.) 180 tablet 3  . aspirin EC 81 MG tablet Take 81 mg by mouth daily.    . butalbital-acetaminophen-caffeine (FIORICET) 50-325-40 MG tablet Take 1 tablet by mouth as needed for migraine.    . Cholecalciferol (VITAMIN D3) 1.25 MG (50000 UT) TABS Take 1 tablet by mouth daily.    . furosemide (LASIX) 40 MG tablet Take 1 tablet (40 mg total) by mouth daily as needed for edema. 90 tablet 3  . lamoTRIgine (LAMICTAL) 100 MG tablet Take 100 mg by mouth in the morning and 200 mg by mouth at bedtime (Patient taking differently: Take 100-200 mg by mouth See admin instructions. Take 100 mg by mouth in the morning and 200 mg by mouth at bedtime) 270 tablet 1  . levothyroxine (SYNTHROID) 25 MCG tablet Take 0.5 tablets (12.5 mcg total) by mouth daily at 6 (six) AM. 15 tablet 0  . metoprolol succinate (TOPROL XL) 25 MG 24 hr tablet Take 0.5 tablets (12.5 mg total) by mouth daily. 90 tablet 1  . SUMAtriptan (IMITREX) 50 MG tablet TAKE ONE TABLET EVERY 2 HOURS AS NEEDED FOR MIGRAINE. MAY REPEAT IN 2 HOURS IF HEADACHE PERSISTS OR RECURS. (MAX OF 2 TABLETS IN 24 HOURS) (Patient taking differently: Take 50 mg by mouth every 2 (two) hours as needed for migraine.) 10 tablet 0  . vitamin B-12 1000 MCG tablet Take 1  tablet (1,000 mcg total) by mouth daily. 30 tablet 0  . warfarin (COUMADIN) 5 MG tablet Take as directed by [facility]. (Patient taking differently: Take 2.5-5 mg by mouth See admin instructions. Take 2.5mg  on Mondays and Fridays, and 5mg  on all other days.Take every night.) 110 tablet 1  . glucose blood (ONETOUCH VERIO) test strip Check blood sugars once daily Dx: E11.9 100 each 12  . Lancets (ONETOUCH ULTRASOFT) lancets Use as instructed to test blood sugars once daily Dx. E11.65 100 each 12  . ONE TOUCH LANCETS MISC Test once daily to check blood sugar. DX  E11.9 100 each 3  . QUEtiapine (SEROQUEL XR) 300 MG 24 hr tablet Take 2 tablets (600 mg total) by mouth every evening. 180 tablet 1  . venlafaxine XR (EFFEXOR XR) 75 MG 24 hr capsule Take 1 capsule (75 mg total) by mouth daily with breakfast. 90 capsule 1   No facility-administered medications prior to visit.    Allergies  Allergen Reactions  . Mucinex [Guaifenesin Er] Other (See Comments)    Severe headaches   . Keflex [Cephalexin] Other (See Comments)    Headaches and dizziness  . Sulfa Antibiotics Other (See Comments)    headaches  . Sulfonamide Derivatives Other (See Comments)    Headaches     Review of Systems  Constitutional: Positive for malaise/fatigue. Negative for fever.  HENT: Negative for congestion.   Eyes: Negative for blurred vision.  Respiratory: Negative for shortness of breath.   Cardiovascular: Negative for chest pain, palpitations and leg swelling.  Gastrointestinal: Negative for abdominal pain, blood in stool and nausea.  Genitourinary: Negative for dysuria and frequency.  Musculoskeletal: Positive for falls and myalgias.  Skin: Negative for rash.  Neurological: Positive for dizziness and loss of consciousness. Negative for headaches.  Endo/Heme/Allergies: Negative for environmental allergies.  Psychiatric/Behavioral: Negative for depression. The patient is not nervous/anxious.        Objective:     Physical Exam Vitals and nursing note reviewed.  Constitutional:      General: She is not in acute distress.    Appearance: She is well-developed and well-nourished.  HENT:     Head: Normocephalic and atraumatic.     Nose: Nose normal.  Eyes:     General:        Right eye: No discharge.        Left eye: No discharge.  Cardiovascular:     Rate and Rhythm: Normal rate. Rhythm irregular.     Heart sounds: Murmur heard.    Pulmonary:     Effort: Pulmonary effort is normal.     Breath sounds: Normal breath sounds.  Abdominal:     General: Bowel sounds are normal.     Palpations: Abdomen is soft.     Tenderness: There is no abdominal tenderness.  Musculoskeletal:        General: No edema.     Cervical back: Normal range of motion and neck supple.  Skin:    General: Skin is warm and dry.     Findings: Lesion present.     Comments: Scab over left lateral malleolus, macular lesion erythematous and mildly crusty over medial aspect of left lower leg. No surrounding fluctuance.   Neurological:     Mental Status: She is alert and oriented to person, place, and time.  Psychiatric:        Mood and Affect: Mood and affect normal.     BP 122/70   Pulse 65   Temp (!) 96.6 F (35.9 C) (Temporal)   Resp 16   Wt 210 lb (95.3 kg)   SpO2 99%   BMI 31.93 kg/m  Wt Readings from Last 3 Encounters:  05/03/20 210 lb (95.3 kg)  04/29/20 203 lb (92.1 kg)  04/25/20 207 lb (93.9 kg)    Diabetic Foot Exam - Simple   No data filed    Lab Results  Component Value Date   WBC 5.4 05/02/2020   HGB 11.0 (L) 05/02/2020   HCT 35.7 (L) 05/02/2020   PLT 195 05/02/2020   GLUCOSE 103 (H) 05/01/2020   CHOL  126 03/29/2020   TRIG 148 03/29/2020   HDL 51 03/29/2020   LDLDIRECT 131.8 (H) 08/23/2019   LDLCALC 45 03/29/2020   ALT 60 (H) 05/01/2020   AST 50 (H) 05/01/2020   NA 138 05/01/2020   K 3.7 05/01/2020   CL 103 05/01/2020   CREATININE 1.07 (H) 05/01/2020   BUN 18 05/01/2020   CO2  26 05/01/2020   TSH 7.662 (H) 04/29/2020   INR 4.3 (HH) 05/03/2020   HGBA1C 5.3 03/28/2020   MICROALBUR 2.3 (H) 08/16/2018    Lab Results  Component Value Date   TSH 7.662 (H) 04/29/2020   Lab Results  Component Value Date   WBC 5.4 05/02/2020   HGB 11.0 (L) 05/02/2020   HCT 35.7 (L) 05/02/2020   MCV 96.7 05/02/2020   PLT 195 05/02/2020   Lab Results  Component Value Date   NA 138 05/01/2020   K 3.7 05/01/2020   CO2 26 05/01/2020   GLUCOSE 103 (H) 05/01/2020   BUN 18 05/01/2020   CREATININE 1.07 (H) 05/01/2020   BILITOT 0.6 05/01/2020   ALKPHOS 51 05/01/2020   AST 50 (H) 05/01/2020   ALT 60 (H) 05/01/2020   PROT 6.3 (L) 05/01/2020   ALBUMIN 3.5 05/01/2020   CALCIUM 8.8 (L) 05/01/2020   ANIONGAP 9 05/01/2020   GFR 45.78 (L) 04/04/2020   Lab Results  Component Value Date   CHOL 126 03/29/2020   Lab Results  Component Value Date   HDL 51 03/29/2020   Lab Results  Component Value Date   LDLCALC 45 03/29/2020   Lab Results  Component Value Date   TRIG 148 03/29/2020   Lab Results  Component Value Date   CHOLHDL 2.5 03/29/2020   Lab Results  Component Value Date   HGBA1C 5.3 03/28/2020       Assessment & Plan:   Problem List Items Addressed This Visit    Bipolar disorder ([place])    Has remained clinically stable as they have been weaning down her medications thus far.       Cellulitis and abscess of left leg    Started on Augmentin and she will notify us if no resolution or worsens. Lesions have been persistent so she may warrant vascular consultation      Renal insufficiency    Hydrate and monitor      Atrial fibrillation ([place])    Is scheduled for cardioversion next week.       Type 2 diabetes mellitus with diabetic polyneuropathy, without long-term current use of insulin ([facility])    hgba1c acceptable, minimize simple carbs. Increase exercise as tolerated. Continue current meds. They have switched her to a plant based diet, whole foods diet and  her numbers are much better.       Recurrent falls    Unclear etiology and likely multifactorial, they are trying to wean her off of medications and recently stopped Trileptal. Are working with her psychiatrist to try and wean off of Seroquel as well. She was recently hospitalized after 6 falls in 5 days. No single cause was found but she has done better since being home. She will continue to work with cardiology, psychiatry and [place] to manage her complex conditions.       Supratherapeutic INR    Mildly elevated. She is asymptomatic and will continue to have it monitored closely      Vitamin B 12 deficiency    Continue to supplement and monitor. Check an intrinsic factor with next blood draw  Other Visit Diagnoses    Need for influenza vaccination    -  Primary   Relevant Orders   Flu Vaccine QUAD High Dose(Fluad) (Completed)   Bipolar II disorder ([place])          I have discontinued Basil C. Mogle's ONE TOUCH LANCETS. I am also having her start on nystatin cream and amoxicillin-clavulanate. Additionally, I am having her maintain her aspirin EC, SUMAtriptan, warfarin, QUEtiapine, lamoTRIgine, venlafaxine XR, acetaminophen, amiodarone, butalbital-acetaminophen-caffeine, metoprolol succinate, cyanocobalamin, furosemide, Vitamin D3, levothyroxine, OneTouch Verio, and onetouch ultrasoft.  Meds ordered this encounter  Medications  . nystatin cream (MYCOSTATIN)    Sig: Apply 1 application topically 2 (two) times daily.    Dispense:  30 g    Refill:  1  . amoxicillin-clavulanate (AUGMENTIN) 875-125 MG tablet    Sig: Take 1 tablet by mouth 2 (two) times daily.    Dispense:  20 tablet    Refill:  0  . glucose blood (ONETOUCH VERIO) test strip    Sig: Check blood sugars once daily Dx: E11.9    Dispense:  100 each    Refill:  12  . Lancets (ONETOUCH ULTRASOFT) lancets    Sig: Use as instructed to test blood sugars once daily Dx. E11.65    Dispense:  100 each    Refill:  12      Penni Homans, MD

## 2020-05-06 NOTE — Assessment & Plan Note (Signed)
Is scheduled for cardioversion next week.

## 2020-05-06 NOTE — Assessment & Plan Note (Signed)
Hydrate and monitor 

## 2020-05-06 NOTE — Assessment & Plan Note (Signed)
Unclear etiology and likely multifactorial, they are trying to wean her off of medications and recently stopped Trileptal. Are working with her psychiatrist to try and wean off of Seroquel as well. She was recently hospitalized after 6 falls in 5 days. No single cause was found but she has done better since being home. She will continue to work with cardiology, psychiatry and [place] to manage her complex conditions.

## 2020-05-06 NOTE — Assessment & Plan Note (Signed)
Continue to supplement and monitor. Check an intrinsic factor with next blood draw

## 2020-05-06 NOTE — Assessment & Plan Note (Signed)
Has remained clinically stable as they have been weaning down her medications thus far.

## 2020-05-06 NOTE — Assessment & Plan Note (Signed)
Mildly elevated. She is asymptomatic and will continue to have it monitored closely

## 2020-05-06 NOTE — Assessment & Plan Note (Addendum)
hgba1c acceptable, minimize simple carbs. Increase exercise as tolerated. Continue current meds. They have switched her to a plant based diet, whole foods diet and her numbers are much better.

## 2020-05-07 ENCOUNTER — Other Ambulatory Visit (HOSPITAL_COMMUNITY)
Admission: RE | Admit: 2020-05-07 | Discharge: 2020-05-07 | Disposition: A | Discharge status: Home / Self Care | Payer: PPO | Source: Ambulatory Visit | Attending: Cardiovascular Disease | Admitting: Cardiovascular Disease

## 2020-05-07 ENCOUNTER — Other Ambulatory Visit: Payer: Self-pay

## 2020-05-07 ENCOUNTER — Telehealth: Payer: Self-pay | Admitting: *Deleted

## 2020-05-07 ENCOUNTER — Telehealth: Payer: Self-pay | Admitting: Family Medicine

## 2020-05-07 ENCOUNTER — Ambulatory Visit (INDEPENDENT_AMBULATORY_CARE_PROVIDER_SITE_OTHER): Payer: PPO | Admitting: *Deleted

## 2020-05-07 DIAGNOSIS — Z01812 Encounter for preprocedural laboratory examination: Secondary | ICD-10-CM | POA: Diagnosis not present

## 2020-05-07 DIAGNOSIS — Z952 Presence of prosthetic heart valve: Secondary | ICD-10-CM

## 2020-05-07 DIAGNOSIS — Z5181 Encounter for therapeutic drug level monitoring: Secondary | ICD-10-CM | POA: Diagnosis not present

## 2020-05-07 DIAGNOSIS — E538 Deficiency of other specified B group vitamins: Secondary | ICD-10-CM

## 2020-05-07 DIAGNOSIS — Z20822 Contact with and (suspected) exposure to covid-19: Secondary | ICD-10-CM | POA: Diagnosis not present

## 2020-05-07 DIAGNOSIS — E1142 Type 2 diabetes mellitus with diabetic polyneuropathy: Secondary | ICD-10-CM

## 2020-05-07 DIAGNOSIS — I4891 Unspecified atrial fibrillation: Secondary | ICD-10-CM

## 2020-05-07 LAB — POCT INR: INR: 3.1 — AB (ref 2.0–3.0)

## 2020-05-07 MED ORDER — LEVOTHYROXINE SODIUM 25 MCG PO TABS: 12.5000 ug | ORAL_TABLET | Freq: Every day | ORAL | 1 refills | Status: DC

## 2020-05-07 NOTE — Telephone Encounter (Signed)
Did verbal with Bethena Roys

## 2020-05-07 NOTE — Patient Instructions (Signed)
Description   Continue on same dosage 1 tablet daily except for 1/2 a tablet on Mondays and Fridays.  Recheck in 1 week.  Pt eats a salad daily.  Call coumadin clinic for any changes in medications or up coming procedures.

## 2020-05-07 NOTE — Telephone Encounter (Signed)
Caller/Agency: Bethena Roys ([place]) [place] Number:  [phone] Requesting OT/PT/Skilled Nursing/Social Work/Speech Therapy: ST Frequency:  1 time a week for 2 weeks 2 time a week for 3 weeks  Also would like verbal for PT & OT evaluation  Ok to leave voicemail

## 2020-05-07 NOTE — Telephone Encounter (Signed)
Saw pt today for her anti coag appointment. Pt has [facility] coming to her home discussed with daughter and will see if [facility] can check pt/inr while they are at pt's home if they are not able to pt will need to come in on 12/22 to have INR check ( 1 week post cardioversion).  Called and spoke to [place], [phone], who stated that she is a Speech therapist but would check to see if they would be able to check pt's pt /inr while they are out. Gave her the number to the coumadin clinic to call back [phone].

## 2020-05-08 LAB — SARS CORONAVIRUS 2 (TAT 6-24 HRS): SARS Coronavirus 2: NEGATIVE

## 2020-05-09 ENCOUNTER — Ambulatory Visit (HOSPITAL_COMMUNITY)
Admission: RE | Admit: 2020-05-09 | Discharge: 2020-05-09 | Disposition: A | Discharge status: Home / Self Care | Payer: PPO | Attending: Cardiovascular Disease | Admitting: Cardiovascular Disease

## 2020-05-09 ENCOUNTER — Encounter (HOSPITAL_COMMUNITY): Payer: Self-pay | Admitting: Cardiovascular Disease

## 2020-05-09 ENCOUNTER — Ambulatory Visit (HOSPITAL_COMMUNITY): Payer: PPO | Admitting: Anesthesiology

## 2020-05-09 ENCOUNTER — Encounter (HOSPITAL_COMMUNITY)
Admission: RE | Disposition: A | Discharge status: Home / Self Care | Payer: Self-pay | Source: Home / Self Care | Attending: Cardiovascular Disease

## 2020-05-09 DIAGNOSIS — Z882 Allergy status to sulfonamides status: Secondary | ICD-10-CM | POA: Insufficient documentation

## 2020-05-09 DIAGNOSIS — F418 Other specified anxiety disorders: Secondary | ICD-10-CM | POA: Diagnosis not present

## 2020-05-09 DIAGNOSIS — I495 Sick sinus syndrome: Secondary | ICD-10-CM | POA: Insufficient documentation

## 2020-05-09 DIAGNOSIS — I5042 Chronic combined systolic (congestive) and diastolic (congestive) heart failure: Secondary | ICD-10-CM | POA: Insufficient documentation

## 2020-05-09 DIAGNOSIS — Z87891 Personal history of nicotine dependence: Secondary | ICD-10-CM | POA: Diagnosis not present

## 2020-05-09 DIAGNOSIS — I110 Hypertensive heart disease with heart failure: Secondary | ICD-10-CM | POA: Diagnosis not present

## 2020-05-09 DIAGNOSIS — I4819 Other persistent atrial fibrillation: Secondary | ICD-10-CM

## 2020-05-09 DIAGNOSIS — Z881 Allergy status to other antibiotic agents status: Secondary | ICD-10-CM | POA: Insufficient documentation

## 2020-05-09 DIAGNOSIS — I4891 Unspecified atrial fibrillation: Secondary | ICD-10-CM | POA: Diagnosis not present

## 2020-05-09 DIAGNOSIS — Z96651 Presence of right artificial knee joint: Secondary | ICD-10-CM | POA: Insufficient documentation

## 2020-05-09 DIAGNOSIS — G43909 Migraine, unspecified, not intractable, without status migrainosus: Secondary | ICD-10-CM | POA: Diagnosis not present

## 2020-05-09 DIAGNOSIS — Z952 Presence of prosthetic heart valve: Secondary | ICD-10-CM | POA: Diagnosis not present

## 2020-05-09 DIAGNOSIS — Z950 Presence of cardiac pacemaker: Secondary | ICD-10-CM | POA: Diagnosis not present

## 2020-05-09 DIAGNOSIS — Z7901 Long term (current) use of anticoagulants: Secondary | ICD-10-CM | POA: Diagnosis not present

## 2020-05-09 DIAGNOSIS — I480 Paroxysmal atrial fibrillation: Secondary | ICD-10-CM | POA: Insufficient documentation

## 2020-05-09 DIAGNOSIS — Z7982 Long term (current) use of aspirin: Secondary | ICD-10-CM | POA: Diagnosis not present

## 2020-05-09 DIAGNOSIS — Z79899 Other long term (current) drug therapy: Secondary | ICD-10-CM | POA: Insufficient documentation

## 2020-05-09 DIAGNOSIS — Z8249 Family history of ischemic heart disease and other diseases of the circulatory system: Secondary | ICD-10-CM | POA: Insufficient documentation

## 2020-05-09 DIAGNOSIS — Z888 Allergy status to other drugs, medicaments and biological substances status: Secondary | ICD-10-CM | POA: Insufficient documentation

## 2020-05-09 DIAGNOSIS — E782 Mixed hyperlipidemia: Secondary | ICD-10-CM | POA: Diagnosis not present

## 2020-05-09 HISTORY — PX: CARDIOVERSION: SHX1299

## 2020-05-09 SURGERY — CARDIOVERSION
Anesthesia: General

## 2020-05-09 MED ORDER — LIDOCAINE 2% (20 MG/ML) 5 ML SYRINGE
INTRAMUSCULAR | Status: DC | PRN
  Administered 2020-05-09: 60 mg via INTRAVENOUS

## 2020-05-09 MED ORDER — PROPOFOL 10 MG/ML IV BOLUS
INTRAVENOUS | Status: DC | PRN
  Administered 2020-05-09: 50 mg via INTRAVENOUS

## 2020-05-09 MED ORDER — SODIUM CHLORIDE 0.9 % IV SOLN
INTRAVENOUS | Status: DC | PRN
Start: 2020-05-09 — End: 2020-05-09

## 2020-05-09 NOTE — Interval H&P Note (Signed)
History and Physical Interval Note:  05/09/2020 9:20 AM  [name]  has presented today for surgery, with the diagnosis of AFIB.  The various methods of treatment have been discussed with the patient and family. After consideration of risks, benefits and other options for treatment, the patient has consented to  Procedure(s): CARDIOVERSION (N/A) as a surgical intervention.  The patient's history has been reviewed, patient examined, no change in status, stable for surgery.  I have reviewed the patient's chart and labs.  Questions were answered to the patient's satisfaction.     [name], MD

## 2020-05-09 NOTE — CV Procedure (Signed)
Electrical Cardioversion Procedure Note [name] [id number] 1950-09-09  Procedure: Electrical Cardioversion Indications:  Atrial Fibrillation  Procedure Details Consent: Risks of procedure as well as the alternatives and risks of each were explained to the (patient/caregiver).  Consent for procedure obtained. Time Out: Verified patient identification, verified procedure, site/side was marked, verified correct patient position, special equipment/implants available, medications/allergies/relevent history reviewed, required imaging and test results available.  Performed  Patient placed on cardiac monitor, pulse oximetry, supplemental oxygen as necessary.  Sedation given: propofol Pacer pads placed anterior and posterior chest.  Cardioverted 2 time(s).  Cardioverted at 150J unsuccessful.  200J successful.  Evaluation Findings: Post procedure EKG shows: AP VP Complications: None Patient did tolerate procedure well.   Cynthia Latch, MD 05/09/2020, 10:05 AM

## 2020-05-09 NOTE — Anesthesia Preprocedure Evaluation (Addendum)
Anesthesia Evaluation  Patient identified by MRN, date of birth, ID band Patient awake    Reviewed: Allergy & Precautions, H&P , NPO status , Patient's Chart, lab work & pertinent test results, reviewed documented beta blocker date and time   Airway Mallampati: II  TM Distance: >3 FB Neck ROM: Full    Dental no notable dental hx. (+) Teeth Intact, Dental Advisory Given   Pulmonary neg pulmonary ROS, former smoker,    Pulmonary exam normal breath sounds clear to auscultation       Cardiovascular hypertension, Pt. on medications and Pt. on home beta blockers + dysrhythmias Atrial Fibrillation + pacemaker  Rhythm:Irregular Rate:Normal     Neuro/Psych  Headaches, Anxiety Depression Bipolar Disorder negative neurological ROS     GI/Hepatic negative GI ROS, Neg liver ROS,   Endo/Other  diabetesHyperthyroidism   Renal/GU negative Renal ROS  negative genitourinary   Musculoskeletal   Abdominal   Peds  Hematology  (+) Blood dyscrasia, anemia ,   Anesthesia Other Findings   Reproductive/Obstetrics negative OB ROS                           Anesthesia Physical Anesthesia Plan  ASA: III  Anesthesia Plan: General   Post-op Pain Management:    Induction: Intravenous  PONV Risk Score and Plan: 3 and Propofol infusion and Treatment may vary due to age or medical condition  Airway Management Planned: Mask  Additional Equipment:   Intra-op Plan:   Post-operative Plan:   Informed Consent: I have reviewed the patients History and Physical, chart, labs and discussed the procedure including the risks, benefits and alternatives for the proposed anesthesia with the patient or authorized representative who has indicated his/her understanding and acceptance.     Dental advisory given  Plan Discussed with: CRNA and Surgeon  Anesthesia Plan Comments:        Anesthesia Quick Evaluation

## 2020-05-09 NOTE — Telephone Encounter (Signed)
Called and spoke to [place] from [facility] who is a speech therapist to see if they would be able to checks pt's pt/inr next week. Bethena Roys stated that PT will be the one to check the pt/inr and they should go out and  Evaluate her tomorrow to see if further pt is needed. She stated I could call the case manager tomorrow Duwayne Heck, [phone] ext [phone] to see if [facility] would be able to check pt's pt/INR tomorrow.

## 2020-05-09 NOTE — Anesthesia Procedure Notes (Signed)
Procedure Name: General with mask airway Date/Time: 05/09/2020 10:00 AM Performed by: Candis Shine, CRNA Pre-anesthesia Checklist: Patient identified, Emergency Drugs available, Suction available, Patient being monitored and Timeout performed Patient Re-evaluated:Patient Re-evaluated prior to induction Oxygen Delivery Method: Ambu bag Preoxygenation: Pre-oxygenation with 100% oxygen Induction Type: IV induction Dental Injury: Teeth and Oropharynx as per pre-operative assessment

## 2020-05-09 NOTE — Discharge Instructions (Signed)
Electrical Cardioversion Electrical cardioversion is the delivery of a jolt of electricity to restore a normal rhythm to the heart. A rhythm that is too fast or is not regular keeps the heart from pumping well. In this procedure, sticky patches or metal paddles are placed on the chest to deliver electricity to the heart from a device. This procedure may be done in an emergency if:  There is low or no blood pressure as a result of the heart rhythm.  Normal rhythm must be restored as fast as possible to protect the brain and heart from further damage.  It may save a life. This may also be a scheduled procedure for irregular or fast heart rhythms that are not immediately life-threatening. Tell a health care provider about:  Any allergies you have.  All medicines you are taking, including vitamins, herbs, eye drops, creams, and over-the-counter medicines.  Any problems you or family members have had with anesthetic medicines.  Any blood disorders you have.  Any surgeries you have had.  Any medical conditions you have.  Whether you are pregnant or may be pregnant. What are the risks? Generally, this is a safe procedure. However, problems may occur, including:  Allergic reactions to medicines.  A blood clot that breaks free and travels to other parts of your body.  The possible return of an abnormal heart rhythm within hours or days after the procedure.  Your heart stopping (cardiac arrest). This is rare. What happens before the procedure? Medicines  Your health care provider may have you start taking: ? Blood-thinning medicines (anticoagulants) so your blood does not clot as easily. ? Medicines to help stabilize your heart rate and rhythm.  Ask your health care provider about: ? Changing or stopping your regular medicines. This is especially important if you are taking diabetes medicines or blood thinners. ? Taking medicines such as aspirin and ibuprofen. These medicines can  thin your blood. Do not take these medicines unless your health care provider tells you to take them. ? Taking over-the-counter medicines, vitamins, herbs, and supplements. General instructions  Follow instructions from your health care provider about eating or drinking restrictions.  Plan to have someone take you home from the hospital or clinic.  If you will be going home right after the procedure, plan to have someone with you for 24 hours.  Ask your health care provider what steps will be taken to help prevent infection. These may include washing your skin with a germ-killing soap. What happens during the procedure?   An IV will be inserted into one of your veins.  Sticky patches (electrodes) or metal paddles may be placed on your chest.  You will be given a medicine to help you relax (sedative).  An electrical shock will be delivered. The procedure may vary among health care providers and hospitals. What can I expect after the procedure?  Your blood pressure, heart rate, breathing rate, and blood oxygen level will be monitored until you leave the hospital or clinic.  Your heart rhythm will be watched to make sure it does not change.  You may have some redness on the skin where the shocks were given. Follow these instructions at home:  Do not drive for 24 hours if you were given a sedative during your procedure.  Take over-the-counter and prescription medicines only as told by your health care provider.  Ask your health care provider how to check your pulse. Check it often.  Rest for 48 hours after the procedure or   as told by your health care provider.  Avoid or limit your caffeine use as told by your health care provider.  Keep all follow-up visits as told by your health care provider. This is important. Contact a health care provider if:  You feel like your heart is beating too quickly or your pulse is not regular.  You have a serious muscle cramp that does not go  away. Get help right away if:  You have discomfort in your chest.  You are dizzy or you feel faint.  You have trouble breathing or you are short of breath.  Your speech is slurred.  You have trouble moving an arm or leg on one side of your body.  Your fingers or toes turn cold or blue. Summary  Electrical cardioversion is the delivery of a jolt of electricity to restore a normal rhythm to the heart.  This procedure may be done right away in an emergency or may be a scheduled procedure if the condition is not an emergency.  Generally, this is a safe procedure.  After the procedure, check your pulse often as told by your health care provider. This information is not intended to replace advice given to you by your health care provider. Make sure you discuss any questions you have with your health care provider. Document Revised: 12/13/2018 Document Reviewed: 12/13/2018 Elsevier Patient Education  2020 [employer]  

## 2020-05-09 NOTE — Anesthesia Postprocedure Evaluation (Signed)
Anesthesia Post Note  Patient: JEANENE MENA  Procedure(s) Performed: CARDIOVERSION (N/A )     Patient location during evaluation: Endoscopy Anesthesia Type: General Level of consciousness: awake and alert Pain management: pain level controlled Vital Signs Assessment: post-procedure vital signs reviewed and stable Respiratory status: spontaneous breathing, nonlabored ventilation and respiratory function stable Cardiovascular status: blood pressure returned to baseline and stable Postop Assessment: no apparent nausea or vomiting Anesthetic complications: no   No complications documented.  Last Vitals:  Vitals:   05/09/20 1020 05/09/20 1030  BP: (!) 150/70 (!) 145/56  Pulse:  (!) 56  Resp:  14  Temp:    SpO2:  99%    Last Pain:  Vitals:   05/09/20 1030  TempSrc:   PainSc: 0-No pain                 Ezri Landers,W. EDMOND

## 2020-05-09 NOTE — Transfer of Care (Signed)
Immediate Anesthesia Transfer of Care Note  Patient: [name]  Procedure(s) Performed: CARDIOVERSION (N/A )  Patient Location: Endoscopy Unit  Anesthesia Type:General  Level of Consciousness: awake, alert  and oriented  Airway & Oxygen Therapy: Patient Spontanous Breathing  Post-op Assessment: Report given to RN and Post -op Vital signs reviewed and stable  Post vital signs: Reviewed and stable  Last Vitals:  Vitals Value Taken Time  BP 130/52 05/09/20 1008  Temp    Pulse 56 05/09/20 1009  Resp 13 05/09/20 1009  SpO2 97 % 05/09/20 1009  Vitals shown include unvalidated device data.  Last Pain:  Vitals:   05/09/20 0900  TempSrc: Tympanic  PainSc: 0-No pain         Complications: No complications documented.

## 2020-05-10 ENCOUNTER — Telehealth: Payer: Self-pay | Admitting: Interventional Cardiology

## 2020-05-10 ENCOUNTER — Telehealth: Payer: Self-pay | Admitting: Family Medicine

## 2020-05-10 MED ORDER — LEVOTHYROXINE SODIUM 25 MCG PO TABS: 12.5000 ug | ORAL_TABLET | Freq: Every day | ORAL | 1 refills | Status: DC

## 2020-05-10 NOTE — Telephone Encounter (Signed)
Called [facility].

## 2020-05-10 NOTE — Telephone Encounter (Addendum)
Called and spoke to pt who stated that [facility] from [facility] is suppose to be coming out today at 5pm to see her, scheudled pt to come to office on 12/22 in case [facility] will not be able to go out and check pt's pt/inr. Called and spoke to [facility] who stated that Magda Paganini who is a physical therapist will be going to see pt today and do an eval, Leslie's phone number is [phone]. Barbette Or, unable to [facility] because the mail box was full.

## 2020-05-10 NOTE — Telephone Encounter (Signed)
levothyroxine (SYNTHROID) 25 MCG tablet [[id number]]   Pharmacy states they need prescription quantity to be for 45 tablet (90 day supply) an any other refills  Call back Number: [phone] Reference number : [id number] Caller: Lenna Sciara

## 2020-05-10 NOTE — Telephone Encounter (Signed)
New rx sent in for 45tabs with 1 refill.

## 2020-05-10 NOTE — Telephone Encounter (Signed)
Patient's daughter Pearla Dubonnet calling to return a phone call from [facility], was in chart looking for who called - we called again while I was doing this and amy answered it.

## 2020-05-11 NOTE — Telephone Encounter (Signed)
Spoke with Amy, pt's daughter she states PT did come out yesterday and evaluate her mother.  They are going to resume PT, and can check INR when they are in the home.  However, with next week being Christmas they did not make an appt for PT next week.  Pt is due for an INR check on 05/16/20 and already has an appt scheduled for 05/16/20 at 1:00pm.  Advised I would call Magda Paganini, [facility] PT and see if they were going to see pt next week.  If so will give a verbal order to check INR in the home.  If they are not coming out next week pt will need to keep scheduled appt.  Will call Amy back once I have spoken with Magda Paganini, [facility] PT and let her know if they can check and we can cancel appt in office.  [name] verbalized understanding.   Attempted to call Magda Paganini [facility] PT at [phone], no answer and voicemail full, unable to leave message. Called Duwayne Heck case manager at [facility] for pt at [phone] ext [phone], left voicemail message requesting a PT/INR be done next week if PT is going to see pt.  Requested call back to advise if this can be done next week in the home. Will await call back.

## 2020-05-12 DIAGNOSIS — D649 Anemia, unspecified: Secondary | ICD-10-CM | POA: Diagnosis not present

## 2020-05-12 DIAGNOSIS — E782 Mixed hyperlipidemia: Secondary | ICD-10-CM | POA: Diagnosis not present

## 2020-05-12 DIAGNOSIS — I4892 Unspecified atrial flutter: Secondary | ICD-10-CM | POA: Diagnosis not present

## 2020-05-12 DIAGNOSIS — G8194 Hemiplegia, unspecified affecting left nondominant side: Secondary | ICD-10-CM | POA: Diagnosis not present

## 2020-05-12 DIAGNOSIS — I5042 Chronic combined systolic (congestive) and diastolic (congestive) heart failure: Secondary | ICD-10-CM | POA: Diagnosis not present

## 2020-05-12 DIAGNOSIS — I110 Hypertensive heart disease with heart failure: Secondary | ICD-10-CM | POA: Diagnosis not present

## 2020-05-12 DIAGNOSIS — L03116 Cellulitis of left lower limb: Secondary | ICD-10-CM | POA: Diagnosis not present

## 2020-05-12 DIAGNOSIS — E039 Hypothyroidism, unspecified: Secondary | ICD-10-CM | POA: Diagnosis not present

## 2020-05-12 DIAGNOSIS — M545 Low back pain, unspecified: Secondary | ICD-10-CM | POA: Diagnosis not present

## 2020-05-12 DIAGNOSIS — G9341 Metabolic encephalopathy: Secondary | ICD-10-CM | POA: Diagnosis not present

## 2020-05-12 DIAGNOSIS — I4891 Unspecified atrial fibrillation: Secondary | ICD-10-CM | POA: Diagnosis not present

## 2020-05-12 DIAGNOSIS — E1169 Type 2 diabetes mellitus with other specified complication: Secondary | ICD-10-CM | POA: Diagnosis not present

## 2020-05-12 DIAGNOSIS — R296 Repeated falls: Secondary | ICD-10-CM | POA: Diagnosis not present

## 2020-05-14 ENCOUNTER — Telehealth: Payer: Self-pay | Admitting: Family Medicine

## 2020-05-14 NOTE — Telephone Encounter (Signed)
No recommendations at this time. They should report if she has any persistent pain or her falls become recurrent again.

## 2020-05-14 NOTE — Telephone Encounter (Signed)
[name]  [phone]  patient had a fall on yesterday 05/13/2020  @11 :00 am patient has minor bruise to her right buttock,shoulder and hands  If any recomadation please call back

## 2020-05-14 NOTE — Telephone Encounter (Addendum)
Called Duwayne Heck, care manager at [facility] she states pt is scheduled for PT on 05/16/20, but they are currently awaiting approval from pt's insurance.  They cannot start PT until this approval is received.  Leanna Battles a verbal order to check a PT/INR while in pt's home if approval is received and they go out to do PT on 05/16/20.  She states once they receive approval from pt's insurance they will contact pt to schedule. Called Amy, pt's daughter and advised her they are setting up PT for pt, but are awaiting insurance approval.  Advised her to keep pt's appt in [facility] on 05/16/20 unless [facility] contacts them and can schedule a PT visit on that date prior to appt.  [name] verbalized understanding.

## 2020-05-15 NOTE — Telephone Encounter (Signed)
Amy states she is falling because of off balance, and no current bruises from yesterday's fall but will call back tomorrow if there is any.

## 2020-05-15 NOTE — Telephone Encounter (Signed)
I see she is seeing cardiology tomorrow so she needs to make sure they know she is still falling. Do we know if she is loosing consciousness. If so she also needs to see neurology. I can put in a new referral if they need it but she has been referred recently enough that she may not need it.

## 2020-05-15 NOTE — Telephone Encounter (Signed)
Called patient and daughter states patient fell again last night 10:30 pm, states she has bruises on her R hip and shoulder, and buttock , and a knot on the back of her head . She also stated she has not seen patient today yet because she is sleep but I told her If she has anymore bruises or pain to call us back .

## 2020-05-16 ENCOUNTER — Other Ambulatory Visit: Payer: Self-pay

## 2020-05-16 ENCOUNTER — Telehealth: Payer: Self-pay | Admitting: Psychiatry

## 2020-05-16 ENCOUNTER — Ambulatory Visit (INDEPENDENT_AMBULATORY_CARE_PROVIDER_SITE_OTHER): Payer: PPO | Admitting: *Deleted

## 2020-05-16 DIAGNOSIS — Z5181 Encounter for therapeutic drug level monitoring: Secondary | ICD-10-CM | POA: Diagnosis not present

## 2020-05-16 DIAGNOSIS — F3181 Bipolar II disorder: Secondary | ICD-10-CM

## 2020-05-16 DIAGNOSIS — Z952 Presence of prosthetic heart valve: Secondary | ICD-10-CM | POA: Diagnosis not present

## 2020-05-16 DIAGNOSIS — I4891 Unspecified atrial fibrillation: Secondary | ICD-10-CM

## 2020-05-16 LAB — POCT INR: INR: 4.2 — AB (ref 2.0–3.0)

## 2020-05-16 MED ORDER — QUETIAPINE FUMARATE ER 200 MG PO TB24: 400.0000 mg | ORAL_TABLET | Freq: Every evening | ORAL | 1 refills | Status: DC

## 2020-05-16 NOTE — Patient Instructions (Addendum)
Description   Hold today, then continue on same dosage 1 tablet daily except for 1/2 a tablet on Mondays and Fridays. Pt eats a salad daily.  Recheck INR in 2 weeks. Call coumadin clinic for any changes in medications or up coming procedures.

## 2020-05-16 NOTE — Telephone Encounter (Addendum)
Received refill request for Seroquel XR from [facility]. Reviewed record. Pt has been hospitalized x 2 since last visit and is continuing to have falls. Called daughter, Amy, to follow-up since dose reduction in Seroquel XR has been discussed at last visit. Amy reports that pt is continuing to have frequent falls and unsteadiness. Amy reports that pt has excessive somnolence after taking Seroquel XR. Amy reports that pt's mood remains stable and there have been no recent psychotic s/s. Agreed to reduce Seroquel XR dose to 400 mg po q evening. Will send in script for Seroquel XR 200 mg 2 tabs po q evening to allow for further dose reduction if appropriate. Will request that office staff contact daughter to schedule follow-up apt. Advised daughter to call back in 3-4 weeks with update re: how pt is doing with decrease in Seroquel XR in terms of mood s/s and falls and can evaluate whether to decrease Seroquel further. Advised daughter to contact office if pt has worsening s/s.

## 2020-05-29 ENCOUNTER — Telehealth: Payer: Self-pay | Admitting: Family Medicine

## 2020-05-29 NOTE — Telephone Encounter (Signed)
Caller: Viviann Spare [facility]) Call back number:[phone]  Calling to report a fall took place on 05/28/20 around 12pm no injuries

## 2020-05-30 NOTE — Telephone Encounter (Signed)
FYI

## 2020-05-31 ENCOUNTER — Telehealth: Payer: Self-pay | Admitting: Family Medicine

## 2020-05-31 ENCOUNTER — Other Ambulatory Visit: Payer: Self-pay | Admitting: Family Medicine

## 2020-05-31 DIAGNOSIS — E782 Mixed hyperlipidemia: Secondary | ICD-10-CM | POA: Diagnosis not present

## 2020-05-31 DIAGNOSIS — G43909 Migraine, unspecified, not intractable, without status migrainosus: Secondary | ICD-10-CM | POA: Diagnosis not present

## 2020-05-31 DIAGNOSIS — E0500 Thyrotoxicosis with diffuse goiter without thyrotoxic crisis or storm: Secondary | ICD-10-CM | POA: Diagnosis not present

## 2020-05-31 DIAGNOSIS — R55 Syncope and collapse: Secondary | ICD-10-CM | POA: Diagnosis not present

## 2020-05-31 DIAGNOSIS — F319 Bipolar disorder, unspecified: Secondary | ICD-10-CM | POA: Diagnosis not present

## 2020-05-31 DIAGNOSIS — M545 Low back pain, unspecified: Secondary | ICD-10-CM | POA: Diagnosis not present

## 2020-05-31 DIAGNOSIS — D649 Anemia, unspecified: Secondary | ICD-10-CM | POA: Diagnosis not present

## 2020-05-31 DIAGNOSIS — I4892 Unspecified atrial flutter: Secondary | ICD-10-CM | POA: Diagnosis not present

## 2020-05-31 DIAGNOSIS — I4891 Unspecified atrial fibrillation: Secondary | ICD-10-CM | POA: Diagnosis not present

## 2020-05-31 DIAGNOSIS — I110 Hypertensive heart disease with heart failure: Secondary | ICD-10-CM | POA: Diagnosis not present

## 2020-05-31 DIAGNOSIS — I5042 Chronic combined systolic (congestive) and diastolic (congestive) heart failure: Secondary | ICD-10-CM | POA: Diagnosis not present

## 2020-05-31 DIAGNOSIS — R9431 Abnormal electrocardiogram [ECG] [EKG]: Secondary | ICD-10-CM | POA: Diagnosis not present

## 2020-05-31 DIAGNOSIS — M109 Gout, unspecified: Secondary | ICD-10-CM | POA: Diagnosis not present

## 2020-05-31 NOTE — Telephone Encounter (Signed)
Caller: Bosie Clos [facility])  Call back # [phone]  Speech Therapy  Patient has a new insurance new verbal is needed  1 time a week for 1 week 2 time a week for 2 weeks  Ok to leave voicemail

## 2020-05-31 NOTE — Telephone Encounter (Signed)
LMOM for [name]- verbal orders given.

## 2020-06-07 NOTE — Progress Notes (Signed)
Cardiology Office Note:    Date:  06/08/2020   ID:  [name], DOB 09-Apr-1951, MRN [id number]  PCP:  Mosie Lukes, MD  Cardiologist:  Sinclair Grooms, MD   Referring MD: Mosie Lukes, MD   Chief Complaint  Patient presents with  . Atrial Fibrillation  . Cardiac Valve Problem    History of Present Illness:    [name] is a 70 y.o. female with a hx of post aortic valve replacement with a mechanical prosthesis 2011, history of recurrent atrial fibrillation, hyperlipidemia, diabetes mellitus type 2, and chronic anticoagulation therapy with Coumadin, and chronic diastolic HF.  She was having uncontrolled atrial fibrillation and atrial flutter, having bradycardia as well, underwent pacemaker implantation, amiodarone loading, and cardioversion successfully.  She is now doing well.  Heart stays in rhythm.  She is not having palpitations.  Near syncopal episodes have resolved.  She is still on amiodarone 200 mg twice daily.  I tried to determine if this was the intention or if it would be okay to decrease to 200 mg/day.  She has lost significant weight because of change in diet.  She denies lower extremity swelling, orthopnea, chest pain, palpitations, and syncope.  Past Medical History:  Diagnosis Date  . ANEMIA 05/28/2010  . AORTIC VALVE REPLACEMENT, HX OF 03/11/2010   Mechanical prosthesis  . BIPOLAR AFFECTIVE DISORDER 03/11/2010  . Depression   . Diverticulitis 12/18/2010  . FIBROIDS, UTERUS 03/11/2010  . Gout 09/04/2016  . Grave's disease 8-12  . Hyperlipidemia associated with type 2 diabetes mellitus ([place]) 06/05/2016  . Hypertension   . Low back pain 11/02/2017  . Migraine 06/05/2016  . Mixed hyperlipidemia 03/11/2010  . Obesity   . Skin cancer   . Syncope 08/22/2019  . UTI (lower urinary tract infection) 08/14/2011    Past Surgical History:  Procedure Laterality Date  . ABDOMINAL HYSTERECTOMY  ?1998   sb/l spo, total for fibroids/heavy bleeding  . ANKLE SURGERY  Left 01/2018   hardware removal  . AORTIC VALVE REPLACEMENT  2011   Mechanical prosthesis, [employer]  . APPENDECTOMY     Required revision for EColi infection, required recurrent packing  . bowel obstruction     Requiring adhesions to be removed  . CARDIOVERSION N/A 05/09/2020   Procedure: CARDIOVERSION;  Surgeon: [name], MD;  Location: [facility];  Service: Cardiovascular;  Laterality: N/A;  . CHOLECYSTECTOMY    . COLONOSCOPY WITH PROPOFOL N/A 11/01/2015   Procedure: COLONOSCOPY WITH PROPOFOL;  Surgeon: Milus Banister, MD;  Location: [facility];  Service: Endoscopy;  Laterality: N/A;  . CYSTOCELE REPAIR N/A 10/16/2015   Procedure: ANTERIOR REPAIR (CYSTOCELE);  Surgeon: Donnamae Jude, MD;  Location: [facility];  Service: Gynecology;  Laterality: N/A;  . HERNIA REPAIR  08-16-10  . LEFT HEART CATHETERIZATION WITH CORONARY ANGIOGRAM N/A 12/05/2011   Procedure: LEFT HEART CATHETERIZATION WITH CORONARY ANGIOGRAM;  Surgeon: Sinclair Grooms, MD;  Location: [facility];  Service: Cardiovascular;  Laterality: N/A;  . PACEMAKER IMPLANT N/A 01/09/2020   Procedure: PACEMAKER IMPLANT;  Surgeon: Evans Lance, MD;  Location: [facility];  Service: Cardiovascular;  Laterality: N/A;  . TOTAL KNEE ARTHROPLASTY Right 02/24/2013   Procedure: RIGHT TOTAL KNEE ARTHROPLASTY;  Surgeon: Johnn Hai, MD;  Location: [facility];  Service: Orthopedics;  Laterality: Right;  . TUBAL LIGATION    . varicose vein surgery b/l legs      Current Medications: Current Meds  Medication  Sig  . acetaminophen (TYLENOL) 650 MG CR tablet Take 1,300 mg by mouth every 8 (eight) hours as needed for pain.  Marland Kitchen amiodarone (PACERONE) 200 MG tablet TAKE 1 TABLET BY MOUTH TWICE A DAY  . aspirin EC 81 MG tablet Take 81 mg by mouth daily.  . butalbital-acetaminophen-caffeine (FIORICET) 50-325-40 MG tablet Take 1 tablet by mouth as needed for migraine.  . Cholecalciferol (VITAMIN D3) 1.25 MG (50000 UT) TABS Take 1 tablet by  mouth daily.  . furosemide (LASIX) 40 MG tablet Take 40 mg by mouth daily.  Marland Kitchen glucose blood (ONETOUCH VERIO) test strip Check blood sugars once daily Dx: E11.9  . lamoTRIgine (LAMICTAL) 100 MG tablet Take 100 mg by mouth in the morning and 200 mg by mouth at bedtime  . Lancets (ONETOUCH ULTRASOFT) lancets Use as instructed to test blood sugars once daily Dx. E11.65  . levothyroxine (SYNTHROID) 25 MCG tablet Take 1 tablet (25 mcg total) by mouth daily before breakfast.  . metoprolol succinate (TOPROL XL) 25 MG 24 hr tablet Take 0.5 tablets (12.5 mg total) by mouth daily.  Marland Kitchen nystatin cream (MYCOSTATIN) Apply 1 application topically 2 (two) times daily.  . QUEtiapine (SEROQUEL XR) 200 MG 24 hr tablet Take 2 tablets (400 mg total) by mouth every evening.  . SUMAtriptan (IMITREX) 50 MG tablet TAKE ONE TABLET EVERY 2 HOURS AS NEEDED FOR MIGRAINE. MAY REPEAT IN 2 HOURS IF HEADACHE PERSISTS OR RECURS. (MAX OF 2 TABLETS IN 24 HOURS)  . venlafaxine XR (EFFEXOR XR) 75 MG 24 hr capsule Take 1 capsule (75 mg total) by mouth daily with breakfast.  . vitamin B-12 1000 MCG tablet Take 1 tablet (1,000 mcg total) by mouth daily.  Marland Kitchen warfarin (COUMADIN) 5 MG tablet Take as directed by Coumad[facility] Clinic.     Allergies:   Mucinex [guaifenesin er], Keflex [cephalexin], Sulfa antibiotics, and Sulfonamide derivatives   Social History   Socioeconomic History  . Marital status: Married    Spouse name: Not on file  . Number of children: Not on file  . Years of education: Not on file  . Highest education level: Not on file  Occupational History  . Not on file  Tobacco Use  . Smoking status: Former Smoker    Quit date: 05/26/1990    Years since quitting: 30.0  . Smokeless tobacco: Never Used  Vaping Use  . Vaping Use: Never used  Substance and Sexual Activity  . Alcohol use: Yes    Alcohol/week: 0.0 standard drinks    Comment: rare  . Drug use: No  . Sexual activity: Not Currently  Other Topics Concern  . Not  on file  Social History Narrative  . Not on file   Social Determinants of Health   Financial Resource Strain: Not on file  Food Insecurity: Not on file  Transportation Needs: Not on file  Physical Activity: Not on file  Stress: Not on file  Social Connections: Not on file     Family History: The patient's family history includes Alcohol abuse in her brother, brother, father, and maternal grandfather; Aneurysm in her mother; Arthritis in her mother; Cancer in her maternal grandmother; Depression in her brother, brother, paternal grandfather, paternal grandmother, sister, and sister; Diabetes in her maternal grandfather, paternal grandfather, and paternal grandmother; Heart disease in her brother and maternal grandfather; Scoliosis in her mother.  ROS:   Please see the history of present illness.    No blood in the urine or stool.  Accompanied by  her daughter.  They are eating a near vegan diet.  All other systems reviewed and are negative.  EKGs/Labs/Other Studies Reviewed:    The following studies were reviewed today: No new imaging data.  She did have cardioversion that was successful.  Doppler echocardiogram November 2021: IMPRESSIONS    1. Left ventricular ejection fraction, by estimation, is 50 to 55%. The  left ventricle has low normal function. Left ventricular endocardial  border not optimally defined to evaluate regional wall motion. Left  ventricular diastolic parameters are  indeterminate.  2. Right ventricular systolic function is mildly reduced. The right  ventricular size is normal. Tricuspid regurgitation signal is inadequate  for assessing PA pressure.  3. The mitral valve is degenerative. No evidence of mitral valve  regurgitation. No evidence of mitral stenosis. Moderate mitral annular  calcification.  4. The aortic valve has been repaired/replaced. Aortic valve  regurgitation is trivial. No aortic stenosis is present. Mechanical valve  leaflets not  well visualized. There is a 25 mm [employer]. Jude mechanical valved  conduit valve present in the aortic  position. Procedure Date: 05/22/2008. Echo findings are consistent with  normal structure and function of the aortic valve prosthesis. Aortic valve  mean gradient measures 7.0 mmHg.  5. Aortic root/ascending aorta has been repaired/replaced. Grossly normal  appearance in proximal segment, not well visualized beyond proximal  ascending aorta.  6. The inferior vena cava is normal in size with greater than 50%  respiratory variability, suggesting right atrial pressure of 3 mmHg.   Conclusion(s)/Recommendation(s): No intracardiac source of embolism  detected on this transthoracic study. A transesophageal echocardiogram is  recommended to exclude cardiac source of embolism if clinically indicated.    EKG:  EKG performed on May 09, 2020 demonstrated trolled pacing with nonspecific T wave abnormality.  Relatively low voltage.  The tracing is not performed today  Recent Labs: 04/29/2020: TSH 7.662 05/01/2020: ALT 60; BUN 18; Creatinine, Ser 1.07; Magnesium 2.1; Potassium 3.7; Sodium 138 05/02/2020: Hemoglobin 11.0; Platelets 195  Recent Lipid Panel    Component Value Date/Time   CHOL 126 03/29/2020 0019   TRIG 148 03/29/2020 0019   HDL 51 03/29/2020 0019   CHOLHDL 2.5 03/29/2020 0019   VLDL 30 03/29/2020 0019   LDLCALC 45 03/29/2020 0019   LDLDIRECT 131.8 (H) 08/23/2019 0312    Physical Exam:    VS:  BP 106/64   Pulse (!) 48   Ht 5\' 8"  (1.727 m)   Wt 209 lb (94.8 kg)   SpO2 96%   BMI 31.78 kg/m     Wt Readings from Last 3 Encounters:  06/08/20 209 lb (94.8 kg)  05/09/20 205 lb (93 kg)  05/03/20 210 lb (95.3 kg)     GEN: Compatible with age.  Significant weight loss since last time I saw her in early 2021. No acute distress HEENT: Normal NECK: No JVD. LYMPHATICS: No lymphadenopathy CARDIAC: 1-2 /6 crescendo decrescendo systolic murmur.  No diastolic murmurs heard.Marland Kitchen RRR  with crisp mechanical aortic valve closure sounds.  No gallop, or edema. VASCULAR:  Normal Pulses. No bruits. RESPIRATORY:  Clear to auscultation without rales, wheezing or rhonchi  ABDOMEN: Soft, non-tender, non-distended, No pulsatile mass, MUSCULOSKELETAL: No deformity  SKIN: Warm and dry NEUROLOGIC:  Alert and oriented x 3 PSYCHIATRIC:  Normal affect   ASSESSMENT:    1. Atrial fibrillation, unspecified type ([place])   2. Hx of mechanical aortic valve replacement   3. Tachycardia-bradycardia syndrome ([place])   4. Cardiac pacemaker in situ  5. Chronic combined systolic and diastolic heart failure ([place])   6. Long term current use of anticoagulant therapy   7. Essential hypertension, benign   8. Hyperlipidemia associated with type 2 diabetes mellitus ([place])   9. Educated about COVID-19 virus infection    PLAN:    In order of problems listed above:  1. Maintaining sinus rhythm based upon auscultation today and the EKG done in December.  We will decrease amiodarone to 200 mg/day.  She has been on 400 mg now for greater than 4 months.  No other changes will be made. 2. Normal mechanical valve function based upon auscultation and most recent echocardiogram from November 2021. 3. Pacemaker and amiodarone therapy not being used in controlling this problem.  She is doing much better. 4. Followed by Dr. Lovena Le. 5. Most recent EF is mildly decreased for a female in the 50 to 55% range.  Continue current regimen. 6. Continue Coumadin therapy.  Followed up in [facility]. 7. Blood pressure is relatively low.  Consider discontinuing beta-blocker therapy but with the decrease and amiodarone, will continue the very low-dose of metoprolol for the time being. 8. Continue current therapy. 48. Vaccinated, boosted, and practicing social mitigation.  Plan clinical follow-up in 6 months.  Liver enzymes, TSH, and chest x-ray should be done once a year at a minimum.  Will keep Dr. Lovena Le in the loop.   We will let him know that I have decreased the intensity of amiodarone.  If he disagrees we will quickly increase back to 400 mg/day.   Medication Adjustments/Labs and Tests Ordered: Current medicines are reviewed at length with the patient today.  Concerns regarding medicines are outlined above.  No orders of the defined types were placed in this encounter.  No orders of the defined types were placed in this encounter.   There are no Patient Instructions on file for this visit.   Signed, Sinclair Grooms, MD  06/08/2020 2:09 PM    [facility]

## 2020-06-08 ENCOUNTER — Ambulatory Visit (INDEPENDENT_AMBULATORY_CARE_PROVIDER_SITE_OTHER): Payer: HMO

## 2020-06-08 ENCOUNTER — Ambulatory Visit (INDEPENDENT_AMBULATORY_CARE_PROVIDER_SITE_OTHER): Payer: HMO | Admitting: Interventional Cardiology

## 2020-06-08 ENCOUNTER — Other Ambulatory Visit: Payer: Self-pay

## 2020-06-08 ENCOUNTER — Encounter: Payer: Self-pay | Admitting: Interventional Cardiology

## 2020-06-08 VITALS — BP 106/64 | HR 48 | Ht 68.0 in | Wt 209.0 lb

## 2020-06-08 DIAGNOSIS — E785 Hyperlipidemia, unspecified: Secondary | ICD-10-CM

## 2020-06-08 DIAGNOSIS — Z7901 Long term (current) use of anticoagulants: Secondary | ICD-10-CM | POA: Diagnosis not present

## 2020-06-08 DIAGNOSIS — Z5181 Encounter for therapeutic drug level monitoring: Secondary | ICD-10-CM

## 2020-06-08 DIAGNOSIS — Z952 Presence of prosthetic heart valve: Secondary | ICD-10-CM | POA: Diagnosis not present

## 2020-06-08 DIAGNOSIS — I495 Sick sinus syndrome: Secondary | ICD-10-CM | POA: Diagnosis not present

## 2020-06-08 DIAGNOSIS — Z7189 Other specified counseling: Secondary | ICD-10-CM | POA: Diagnosis not present

## 2020-06-08 DIAGNOSIS — E1169 Type 2 diabetes mellitus with other specified complication: Secondary | ICD-10-CM

## 2020-06-08 DIAGNOSIS — I10 Essential (primary) hypertension: Secondary | ICD-10-CM | POA: Diagnosis not present

## 2020-06-08 DIAGNOSIS — I4891 Unspecified atrial fibrillation: Secondary | ICD-10-CM

## 2020-06-08 DIAGNOSIS — I5042 Chronic combined systolic (congestive) and diastolic (congestive) heart failure: Secondary | ICD-10-CM | POA: Diagnosis not present

## 2020-06-08 DIAGNOSIS — Z950 Presence of cardiac pacemaker: Secondary | ICD-10-CM

## 2020-06-08 LAB — POCT INR: INR: 3.4 — AB (ref 2.0–3.0)

## 2020-06-08 MED ORDER — AMIODARONE HCL 200 MG PO TABS: 200.0000 mg | ORAL_TABLET | Freq: Every day | ORAL | 3 refills | Status: DC

## 2020-06-08 NOTE — Patient Instructions (Addendum)
Description   Continue on same dosage 1 tablet daily except 1/2 tablet on Mondays and Fridays. Pt eats a salad daily.  Recheck INR in 3 weeks. Amiodarone decreased to once daily on 06/08/20.  Call coumadin clinic for any changes in medications or up coming procedures.

## 2020-06-08 NOTE — Patient Instructions (Addendum)
Medication Instructions:  1) DECREASE Amiodarone to 200mg  once daily  *If you need a refill on your cardiac medications before your next appointment, please call your pharmacy*   Lab Work: None If you have labs (blood work) drawn today and your tests are completely normal, you will receive your results only by: Marland Kitchen MyChart Message (if you have MyChart) OR . A paper copy in the mail If you have any lab test that is abnormal or we need to change your treatment, we will call you to review the results.   Testing/Procedures: None   Follow-Up: At [facility], you and your health needs are our priority.  As part of our continuing mission to provide you with exceptional heart care, we have created designated Provider Care Teams.  These Care Teams include your primary Cardiologist (physician) and Advanced Practice Providers (APPs -  Physician Assistants and Nurse Practitioners) who all work together to provide you with the care you need, when you need it.  We recommend signing up for the patient portal called "MyChart".  Sign up information is provided on this After Visit Summary.  MyChart is used to connect with patients for Virtual Visits (Telemedicine).  Patients are able to view lab/test results, encounter notes, upcoming appointments, etc.  Non-urgent messages can be sent to your provider as well.   To learn more about what you can do with MyChart, go to [email or web address].    Your next appointment:   6 month(s)  The format for your next appointment:   In Person  Provider:   You may see Sinclair Grooms, MD or one of the following Advanced Practice Providers on your designated Care Team:    Cecilie Kicks, NP  Kathyrn Drown, NP    Other Instructions

## 2020-06-12 ENCOUNTER — Telehealth: Payer: Self-pay

## 2020-06-12 NOTE — Telephone Encounter (Signed)
[place] Name [place] Name [place] Number [phone] Patient Name [name] Patient DOB 1951-04-21 Reason for Call Report a patient fall Initial Comment Caller from [place] to report a couple of falls a patient had Additional Comment She had a fall on 06/09/2020 and 06/11/2020. She has a little bruising on her buttocks. Disp. Time Disposition Final User 06/12/2020 10:24:27 AM Send to [facility] Paging Queue Learta Codding 06/12/2020 10:36:58 AM Called On-Call Provider Kelby Aline 06/12/2020 10:37:18 AM Page Completed Yes Kelby Aline Paging DoctorName Phone DateTime Result/Outcome Message Type Notes Billey Gosling - MD [phone] 06/12/2020 10:36:58 AM Called On Call Provider - Reached Doctor Paged Billey Gosling - MD 06/12/2020 10:37:04 AM Spoke with On Call - General Message Result Call Closed By: Kelby Aline Transaction Date/Time: 06/12/2020 10:20:31 AM (ET)

## 2020-06-13 ENCOUNTER — Emergency Department (HOSPITAL_BASED_OUTPATIENT_CLINIC_OR_DEPARTMENT_OTHER)
Admission: EM | Admit: 2020-06-13 | Discharge: 2020-06-13 | Disposition: A | Discharge status: Home / Self Care | Payer: HMO | Attending: Emergency Medicine | Admitting: Emergency Medicine

## 2020-06-13 ENCOUNTER — Encounter (HOSPITAL_BASED_OUTPATIENT_CLINIC_OR_DEPARTMENT_OTHER): Payer: Self-pay | Admitting: Emergency Medicine

## 2020-06-13 ENCOUNTER — Emergency Department (HOSPITAL_BASED_OUTPATIENT_CLINIC_OR_DEPARTMENT_OTHER): Payer: HMO

## 2020-06-13 ENCOUNTER — Other Ambulatory Visit: Payer: Self-pay

## 2020-06-13 DIAGNOSIS — Z87891 Personal history of nicotine dependence: Secondary | ICD-10-CM | POA: Insufficient documentation

## 2020-06-13 DIAGNOSIS — Z7901 Long term (current) use of anticoagulants: Secondary | ICD-10-CM | POA: Insufficient documentation

## 2020-06-13 DIAGNOSIS — S065X9A Traumatic subdural hemorrhage with loss of consciousness of unspecified duration, initial encounter: Secondary | ICD-10-CM

## 2020-06-13 DIAGNOSIS — Z7982 Long term (current) use of aspirin: Secondary | ICD-10-CM | POA: Insufficient documentation

## 2020-06-13 DIAGNOSIS — I10 Essential (primary) hypertension: Secondary | ICD-10-CM | POA: Insufficient documentation

## 2020-06-13 DIAGNOSIS — E039 Hypothyroidism, unspecified: Secondary | ICD-10-CM | POA: Diagnosis not present

## 2020-06-13 DIAGNOSIS — Z950 Presence of cardiac pacemaker: Secondary | ICD-10-CM | POA: Diagnosis not present

## 2020-06-13 DIAGNOSIS — S0101XA Laceration without foreign body of scalp, initial encounter: Secondary | ICD-10-CM

## 2020-06-13 DIAGNOSIS — Z96651 Presence of right artificial knee joint: Secondary | ICD-10-CM | POA: Insufficient documentation

## 2020-06-13 DIAGNOSIS — Z23 Encounter for immunization: Secondary | ICD-10-CM | POA: Diagnosis not present

## 2020-06-13 DIAGNOSIS — W01198A Fall on same level from slipping, tripping and stumbling with subsequent striking against other object, initial encounter: Secondary | ICD-10-CM | POA: Insufficient documentation

## 2020-06-13 DIAGNOSIS — M50322 Other cervical disc degeneration at C5-C6 level: Secondary | ICD-10-CM | POA: Diagnosis not present

## 2020-06-13 DIAGNOSIS — I6200 Nontraumatic subdural hemorrhage, unspecified: Secondary | ICD-10-CM | POA: Diagnosis not present

## 2020-06-13 DIAGNOSIS — E1142 Type 2 diabetes mellitus with diabetic polyneuropathy: Secondary | ICD-10-CM | POA: Diagnosis not present

## 2020-06-13 DIAGNOSIS — Z85828 Personal history of other malignant neoplasm of skin: Secondary | ICD-10-CM | POA: Insufficient documentation

## 2020-06-13 DIAGNOSIS — S065X0A Traumatic subdural hemorrhage without loss of consciousness, initial encounter: Secondary | ICD-10-CM | POA: Insufficient documentation

## 2020-06-13 DIAGNOSIS — N390 Urinary tract infection, site not specified: Secondary | ICD-10-CM

## 2020-06-13 DIAGNOSIS — W19XXXA Unspecified fall, initial encounter: Secondary | ICD-10-CM

## 2020-06-13 DIAGNOSIS — S065XAA Traumatic subdural hemorrhage with loss of consciousness status unknown, initial encounter: Secondary | ICD-10-CM

## 2020-06-13 DIAGNOSIS — Z79899 Other long term (current) drug therapy: Secondary | ICD-10-CM | POA: Insufficient documentation

## 2020-06-13 DIAGNOSIS — I708 Atherosclerosis of other arteries: Secondary | ICD-10-CM | POA: Diagnosis not present

## 2020-06-13 DIAGNOSIS — S0990XA Unspecified injury of head, initial encounter: Secondary | ICD-10-CM | POA: Diagnosis present

## 2020-06-13 DIAGNOSIS — S0003XA Contusion of scalp, initial encounter: Secondary | ICD-10-CM | POA: Diagnosis not present

## 2020-06-13 DIAGNOSIS — S199XXA Unspecified injury of neck, initial encounter: Secondary | ICD-10-CM | POA: Diagnosis not present

## 2020-06-13 LAB — URINALYSIS, MICROSCOPIC (REFLEX): WBC, UA: >50 WBC/hpf (ref 0–5)

## 2020-06-13 LAB — CBC
HCT: 36.6 % (ref 36.0–46.0)
Hemoglobin: 11.9 g/dL — ABNORMAL LOW (ref 12.0–15.0)
MCH: 30.7 pg (ref 26.0–34.0)
MCHC: 32.5 g/dL (ref 30.0–36.0)
MCV: 94.6 fL (ref 80.0–100.0)
Platelets: 210 10*3/uL (ref 150–400)
RBC: 3.87 MIL/uL (ref 3.87–5.11)
RDW: 14.1 % (ref 11.5–15.5)
WBC: 6.4 10*3/uL (ref 4.0–10.5)
nRBC: 0 % (ref 0.0–0.2)

## 2020-06-13 LAB — BASIC METABOLIC PANEL
Anion gap: 13 (ref 5–15)
BUN: 22 mg/dL (ref 8–23)
CO2: 26 mmol/L (ref 22–32)
Calcium: 9.1 mg/dL (ref 8.9–10.3)
Chloride: 100 mmol/L (ref 98–111)
Creatinine, Ser: 1.47 mg/dL — ABNORMAL HIGH (ref 0.44–1.00)
GFR, Estimated: 38 mL/min — ABNORMAL LOW (ref >60)
Glucose, Bld: 99 mg/dL (ref 70–99)
Potassium: 4.6 mmol/L (ref 3.5–5.1)
Sodium: 139 mmol/L (ref 135–145)

## 2020-06-13 LAB — URINALYSIS, ROUTINE W REFLEX MICROSCOPIC
Bilirubin Urine: NEGATIVE
Glucose, UA: NEGATIVE mg/dL
Hgb urine dipstick: NEGATIVE
Ketones, ur: NEGATIVE mg/dL
Nitrite: NEGATIVE
Protein, ur: NEGATIVE mg/dL
Specific Gravity, Urine: 1.03 (ref 1.005–1.030)
pH: 6 (ref 5.0–8.0)

## 2020-06-13 LAB — PROTIME-INR
INR: 2.9 — ABNORMAL HIGH (ref 0.8–1.2)
Prothrombin Time: 29.4 seconds — ABNORMAL HIGH (ref 11.4–15.2)

## 2020-06-13 MED ORDER — LIDOCAINE-EPINEPHRINE (PF) 2 %-1:200000 IJ SOLN
10.0000 mL | Freq: Once | INTRAMUSCULAR | Status: AC
  Administered 2020-06-13: 10 mL
  Filled 2020-06-13: qty 20

## 2020-06-13 MED ORDER — FOSFOMYCIN TROMETHAMINE 3 G PO PACK
3.0000 g | PACK | Freq: Once | ORAL | Status: AC
  Administered 2020-06-13: 3 g via ORAL
  Filled 2020-06-13: qty 3

## 2020-06-13 MED ORDER — TETANUS-DIPHTH-ACELL PERTUSSIS 5-2.5-18.5 LF-MCG/0.5 IM SUSY
0.5000 mL | PREFILLED_SYRINGE | Freq: Once | INTRAMUSCULAR | Status: AC
  Administered 2020-06-13: 0.5 mL via INTRAMUSCULAR
  Filled 2020-06-13: qty 0.5

## 2020-06-13 NOTE — ED Provider Notes (Signed)
[facility]ER[facility] DEPARTMENT Provider Note   CSN: [id number] Arrival date & time: 06/13/20  [id number]     History Chief Complaint  Patient presents with  . Fall    [name] is a 70 y.o. female.  HPI Patient presents after a fall.  Lost her balance which is not unusual and hit her head on the door.  No loss conscious.  Laceration to back of head.  Unsure of last tetanus.  No other injury.  Patient is on Coumadin for mechanical valve and atrial fibrillation.  Patient's most recent INR was on Friday with today being Wednesday and INR was 3.4.  Has been well controlled recently.  No numbness weakness.  No extremity pain.  No chest or abdominal pain.  Patient's daughter states the patient has had more falls recently and is worried patient could have a urinary tract infection as she gets a little more unsteady when she has a UTI.    Past Medical History:  Diagnosis Date  . ANEMIA 05/28/2010  . AORTIC VALVE REPLACEMENT, HX OF 03/11/2010   Mechanical prosthesis  . BIPOLAR AFFECTIVE DISORDER 03/11/2010  . Depression   . Diverticulitis 12/18/2010  . FIBROIDS, UTERUS 03/11/2010  . Gout 09/04/2016  . Grave's disease 8-12  . Hyperlipidemia associated with type 2 diabetes mellitus ([place]) 06/05/2016  . Hypertension   . Low back pain 11/02/2017  . Migraine 06/05/2016  . Mixed hyperlipidemia 03/11/2010  . Obesity   . Skin cancer   . Syncope 08/22/2019  . UTI (lower urinary tract infection) 08/14/2011    Patient Active Problem List   Diagnosis Date Noted  . Vitamin B 12 deficiency 05/06/2020  . Supratherapeutic INR 04/30/2020  . Elevated liver enzymes 04/30/2020  . Elevated TSH 04/30/2020  . Altered mental status 04/29/2020  . Tachycardia-bradycardia syndrome ([place]) 04/25/2020  . Pacemaker 04/25/2020  . Recurrent falls 03/27/2020  . Bradycardia 08/23/2019  . Prolonged QT interval   . Near syncope 08/21/2019  . GAD (generalized anxiety disorder) 04/25/2018  . Ankle pain  02/04/2018  . Nocturia 11/02/2017  . Low back pain 11/02/2017  . Type 2 diabetes mellitus with diabetic polyneuropathy, without long-term current use of insulin ([place]) 11/02/2017  . Hyperlipidemia associated with type 2 diabetes mellitus ([place]) 06/05/2016  . Migraine 06/05/2016  . Encounter for therapeutic drug monitoring 09/13/2013  . Essential hypertension, benign 04/05/2013  . Atrial fibrillation ([place]) 03/18/2013  . Long term current use of anticoagulant therapy 03/11/2013  . UTI (urinary tract infection) 02/10/2013  . Pernicious anemia 02/21/2011  . Renal insufficiency 12/30/2010  . Hyperthyroidism 12/18/2010  . Cellulitis and abscess of left leg 11/18/2010  . Anemia 05/28/2010  . Bipolar disorder ([place]) 03/11/2010  . DIVERTICULOSIS OF COLON 03/11/2010  . Constipation 03/11/2010  . Hx of mechanical aortic valve replacement 03/11/2010    Past Surgical History:  Procedure Laterality Date  . ABDOMINAL HYSTERECTOMY  ?1998   sb/l spo, total for fibroids/heavy bleeding  . ANKLE SURGERY Left 01/2018   hardware removal  . AORTIC VALVE REPLACEMENT  2011   Mechanical prosthesis, [employer]  . APPENDECTOMY     Required revision for EColi infection, required recurrent packing  . bowel obstruction     Requiring adhesions to be removed  . CARDIOVERSION N/A 05/09/2020   Procedure: CARDIOVERSION;  Surgeon: [name], MD;  Location: [facility];  Service: Cardiovascular;  Laterality: N/A;  . CHOLECYSTECTOMY    . COLONOSCOPY WITH PROPOFOL N/A 11/01/2015   Procedure: COLONOSCOPY WITH PROPOFOL;  Surgeon: Milus Banister, MD;  Location: [facility];  Service: Endoscopy;  Laterality: N/A;  . CYSTOCELE REPAIR N/A 10/16/2015   Procedure: ANTERIOR REPAIR (CYSTOCELE);  Surgeon: Donnamae Jude, MD;  Location: [facility];  Service: Gynecology;  Laterality: N/A;  . HERNIA REPAIR  08-16-10  . LEFT HEART CATHETERIZATION WITH CORONARY ANGIOGRAM N/A 12/05/2011   Procedure: LEFT HEART CATHETERIZATION WITH  CORONARY ANGIOGRAM;  Surgeon: Sinclair Grooms, MD;  Location: [facility] CATH LAB;  Service: Cardiovascular;  Laterality: N/A;  . PACEMAKER IMPLANT N/A 01/09/2020   Procedure: PACEMAKER IMPLANT;  Surgeon: Evans Lance, MD;  Location: [facility];  Service: Cardiovascular;  Laterality: N/A;  . TOTAL KNEE ARTHROPLASTY Right 02/24/2013   Procedure: RIGHT TOTAL KNEE ARTHROPLASTY;  Surgeon: Johnn Hai, MD;  Location: [facility];  Service: Orthopedics;  Laterality: Right;  . TUBAL LIGATION    . varicose vein surgery b/l legs       OB History    Gravida  2   Para  2   Term  2   Preterm      AB      Living  2     SAB      IAB      Ectopic      Multiple      Live Births              Family History  Problem Relation Age of Onset  . Scoliosis Mother   . Arthritis Mother        Rheumatoid  . Aneurysm Mother        heart  . Alcohol abuse Father   . Depression Sister   . Depression Brother   . Alcohol abuse Brother   . Cancer Maternal Grandmother        colon  . Diabetes Maternal Grandfather   . Heart disease Maternal Grandfather   . Alcohol abuse Maternal Grandfather   . Depression Paternal Grandmother   . Diabetes Paternal Grandmother   . Depression Paternal Grandfather   . Diabetes Paternal Grandfather   . Depression Sister   . Alcohol abuse Brother   . Depression Brother   . Heart disease Brother     Social History   Tobacco Use  . Smoking status: Former Smoker    Quit date: 05/26/1990    Years since quitting: 30.0  . Smokeless tobacco: Never Used  Vaping Use  . Vaping Use: Never used  Substance Use Topics  . Alcohol use: Yes    Alcohol/week: 0.0 standard drinks    Comment: rare  . Drug use: No    Home Medications Prior to Admission medications   Medication Sig Start Date End Date Taking? Authorizing Provider  acetaminophen (TYLENOL) 650 MG CR tablet Take 1,300 mg by mouth every 8 (eight) hours as needed for pain.    [provider]   amiodarone (PACERONE) 200 MG tablet Take 1 tablet (200 mg total) by mouth daily. 06/08/20   Belva Crome, MD  aspirin EC 81 MG tablet Take 81 mg by mouth daily.    [provider]  butalbital-acetaminophen-caffeine (FIORICET) 50-325-40 MG tablet Take 1 tablet by mouth as needed for migraine. 03/08/20   [provider]  Cholecalciferol (VITAMIN D3) 1.25 MG (50000 UT) TABS Take 1 tablet by mouth daily.    [provider]  furosemide (LASIX) 40 MG tablet Take 40 mg by mouth daily.    [provider]  glucose blood (  ONETOUCH VERIO) test strip Check blood sugars once daily Dx: E11.9 05/03/20   Mosie Lukes, MD  lamoTRIgine (LAMICTAL) 100 MG tablet Take 100 mg by mouth in the morning and 200 mg by mouth at bedtime 10/11/19   Thayer Headings, PMHNP  Lancets [facility] ULTRASOFT) lancets Use as instructed to test blood sugars once daily Dx. E11.65 05/03/20   Mosie Lukes, MD  levothyroxine (SYNTHROID) 25 MCG tablet Take 1 tablet (25 mcg total) by mouth daily before breakfast. 05/31/20   Mosie Lukes, MD  metoprolol succinate (TOPROL XL) 25 MG 24 hr tablet Take 0.5 tablets (12.5 mg total) by mouth daily. 03/31/20   Geradine Girt, DO  nystatin cream (MYCOSTATIN) Apply 1 application topically 2 (two) times daily. 05/03/20   Mosie Lukes, MD  QUEtiapine (SEROQUEL XR) 200 MG 24 hr tablet Take 2 tablets (400 mg total) by mouth every evening. 05/16/20   Thayer Headings, PMHNP  SUMAtriptan (IMITREX) 50 MG tablet TAKE ONE TABLET EVERY 2 HOURS AS NEEDED FOR MIGRAINE. MAY REPEAT IN 2 HOURS IF HEADACHE PERSISTS OR RECURS. (MAX OF 2 TABLETS IN 24 HOURS) 12/01/17   Mosie Lukes, MD  venlafaxine XR (EFFEXOR XR) 75 MG 24 hr capsule Take 1 capsule (75 mg total) by mouth daily with breakfast. 10/11/19 04/29/20  Thayer Headings, PMHNP  vitamin B-12 1000 MCG tablet Take 1 tablet (1,000 mcg total) by mouth daily. 04/01/20   Geradine Girt, DO  warfarin (COUMADIN) 5 MG tablet Take as  directed by [facility]. 09/15/19   Belva Crome, MD  methimazole (TAPAZOLE) 5 MG tablet Take 1 tablet (5 mg total) by mouth 3 (three) times daily. 06/24/11 08/31/19  Renato Shin, MD    Allergies    Mucinex [guaifenesin er], Keflex [cephalexin], Sulfa antibiotics, and Sulfonamide derivatives  Review of Systems   Review of Systems  Constitutional: Negative for appetite change and fever.  HENT: Negative for congestion.   Respiratory: Negative for shortness of breath.   Cardiovascular: Negative for chest pain.  Gastrointestinal: Negative for abdominal pain.  Genitourinary: Negative for flank pain.  Musculoskeletal: Negative for back pain.  Neurological: Negative for weakness.  Psychiatric/Behavioral: Negative for confusion.    Physical Exam Updated Vital Signs BP (!) 144/61   Pulse 60   Temp 97.9 F (36.6 C) (Oral)   Resp 20   Ht 5\' 8"  (1.727 m)   Wt 93.9 kg   SpO2 97%   BMI 31.47 kg/m   Physical Exam Vitals and nursing note reviewed.  HENT:     Head: Normocephalic.     Comments: Approximately 4 cm laceration on occipital area.    Mouth/Throat:     Mouth: Mucous membranes are moist.  Eyes:     Extraocular Movements: Extraocular movements intact.  Cardiovascular:     Rate and Rhythm: Regular rhythm.     Comments: Mechanical valve click. Pulmonary:     Breath sounds: No wheezing or rhonchi.  Abdominal:     Tenderness: There is no abdominal tenderness.  Musculoskeletal:        General: No tenderness.     Cervical back: Neck supple.  Skin:    General: Skin is warm.     Capillary Refill: Capillary refill takes less than 2 seconds.  Neurological:     Mental Status: She is alert and oriented to person, place, and time.  Psychiatric:        Mood and Affect: Mood normal.     ED Results /  Procedures / Treatments   Labs (all labs ordered are listed, but only abnormal results are displayed) Labs Reviewed  URINALYSIS, ROUTINE W REFLEX MICROSCOPIC - Abnormal;  Notable for the following components:      Result Value   APPearance HAZY (*)    Leukocytes,Ua SMALL (*)    All other components within normal limits  BASIC METABOLIC PANEL - Abnormal; Notable for the following components:   Creatinine, Ser 1.47 (*)    GFR, Estimated 38 (*)    All other components within normal limits  CBC - Abnormal; Notable for the following components:   Hemoglobin 11.9 (*)    All other components within normal limits  PROTIME-INR - Abnormal; Notable for the following components:   Prothrombin Time 29.4 (*)    INR 2.9 (*)    All other components within normal limits  URINALYSIS, MICROSCOPIC (REFLEX) - Abnormal; Notable for the following components:   Bacteria, UA MANY (*)    All other components within normal limits  URINE CULTURE  PROTIME-INR    EKG None  Radiology CT Head Wo Contrast  Result Date: 06/13/2020 CLINICAL DATA:  Recent trauma with para falcine subdural hematoma EXAM: CT HEAD WITHOUT CONTRAST TECHNIQUE: Contiguous axial images were obtained from the base of the skull through the vertex without intravenous contrast. COMPARISON:  June 13, 2020 study obtained earlier in the day; head CT April 29, 2020 FINDINGS: Brain: Trace falcine subdural hematoma measuring 2 mm in thickness is stable compared to earlier in the day. There is no new or progressing extra-axial fluid/hemorrhage. There is no intra-axial mass or hemorrhage. No midline shift evident. There is stable frontal atrophy bilaterally. Ventricles and sulci otherwise unremarkable in appearance and stable. Vascular: No hyperdense vessel. There is calcification in each carotid siphon and distal vertebral artery. Skull: Bony calvarium appears intact. There are skin staples overlying the posterior high parietal region. Sinuses/Orbits: There is mild mucosal thickening in several ethmoid air cells. Other visualized paranasal sinuses are clear. Visualized orbits appear symmetric bilaterally. Other: Mastoid  air cells are clear. There is debris in the right external auditory canal. IMPRESSION: Stable trace falcine subdural hematoma with maximum thickness of 2 mm. No new or progressing extra-axial fluid collection. No intra-axial hemorrhage. No mass or midline shift. No evident acute infarct. There are foci of arterial vascular calcification. Small high parietal scalp hematoma superiorly with skin staples. No fracture. Foci paranasal sinus disease. Probable cerumen in the right external auditory canal. Electronically Signed   By: Lowella Grip III M.D.   On: 06/13/2020 14:10   CT Head Wo Contrast  Result Date: 06/13/2020 CLINICAL DATA:  Head trauma with abnormal mental status EXAM: CT HEAD WITHOUT CONTRAST CT CERVICAL SPINE WITHOUT CONTRAST TECHNIQUE: Multidetector CT imaging of the head and cervical spine was performed following the standard protocol without intravenous contrast. Multiplanar CT image reconstructions of the cervical spine were also generated. COMPARISON:  03/27/2020 FINDINGS: CT HEAD FINDINGS Brain: Trace falcine subdural hemorrhage which is only convincing in the setting of recent comparison maximal thickness is 2 mm. No evidence of parenchymal or subarachnoid hemorrhage. No evidence of infarct, hydrocephalus, or mass. Vascular: No hyperdense vessel or unexpected calcification. Skull: Posterior scalp laceration.  No calvarial fracture Sinuses/Orbits: No visible injury. Critical Value/emergent results were called by telephone at the time of interpretation on 06/13/2020 at 7:29 am to provider Dr Alvino Chapel, who verbally acknowledged these results. CT CERVICAL SPINE FINDINGS Alignment: Normal. Skull base and vertebrae: No acute fracture. No primary bone lesion  or focal pathologic process. Soft tissues and spinal canal: No prevertebral fluid or swelling. No visible canal hematoma. Disc levels: Advanced C5-6 disc degeneration. Mild degenerative facet spurring. Upper chest: Negative IMPRESSION: 1. Trace  subdural hemorrhage on the falx that is convincing with the benefit of recent comparison. 2. Scalp laceration without calvarial fracture. 3.  Negative for cervical spine fracture. Electronically Signed   By: Monte Fantasia M.D.   On: 06/13/2020 07:33   CT Cervical Spine Wo Contrast  Result Date: 06/13/2020 CLINICAL DATA:  Head trauma with abnormal mental status EXAM: CT HEAD WITHOUT CONTRAST CT CERVICAL SPINE WITHOUT CONTRAST TECHNIQUE: Multidetector CT imaging of the head and cervical spine was performed following the standard protocol without intravenous contrast. Multiplanar CT image reconstructions of the cervical spine were also generated. COMPARISON:  03/27/2020 FINDINGS: CT HEAD FINDINGS Brain: Trace falcine subdural hemorrhage which is only convincing in the setting of recent comparison maximal thickness is 2 mm. No evidence of parenchymal or subarachnoid hemorrhage. No evidence of infarct, hydrocephalus, or mass. Vascular: No hyperdense vessel or unexpected calcification. Skull: Posterior scalp laceration.  No calvarial fracture Sinuses/Orbits: No visible injury. Critical Value/emergent results were called by telephone at the time of interpretation on 06/13/2020 at 7:29 am to provider Dr Alvino Chapel, who verbally acknowledged these results. CT CERVICAL SPINE FINDINGS Alignment: Normal. Skull base and vertebrae: No acute fracture. No primary bone lesion or focal pathologic process. Soft tissues and spinal canal: No prevertebral fluid or swelling. No visible canal hematoma. Disc levels: Advanced C5-6 disc degeneration. Mild degenerative facet spurring. Upper chest: Negative IMPRESSION: 1. Trace subdural hemorrhage on the falx that is convincing with the benefit of recent comparison. 2. Scalp laceration without calvarial fracture. 3.  Negative for cervical spine fracture. Electronically Signed   By: Monte Fantasia M.D.   On: 06/13/2020 07:33    Procedures .Marland KitchenLaceration Repair  Date/Time: 06/13/2020  11:00 AM Performed by: Davonna Belling, MD Authorized by: Davonna Belling, MD   Consent:    Consent obtained:  Verbal   Consent given by:  Patient   Risks, benefits, and alternatives were discussed: yes     Risks discussed:  Infection, pain, retained foreign body, poor cosmetic result, poor wound healing and vascular damage   Alternatives discussed:  No treatment Universal protocol:    Procedure explained and questions answered to patient or proxy's satisfaction: yes     Patient identity confirmed:  Verbally with patient Anesthesia:    Anesthesia method:  Local infiltration   Local anesthetic:  Lidocaine 2% WITH epi Laceration details:    Location:  Scalp   Scalp location:  Occipital   Length (cm):  10 Pre-procedure details:    Preparation:  Imaging obtained to evaluate for foreign bodies Exploration:    Limited defect created (wound extended): no     Hemostasis achieved with:  Epinephrine   Imaging outcome: foreign body not noted     Wound exploration: entire depth of wound visualized     Contaminated: no   Treatment:    Area cleansed with:  Shur-Clens   Amount of cleaning:  Standard   Irrigation method:  Syringe   Visualized foreign bodies/material removed: no   Skin repair:    Repair method:  Staples   Number of staples:  10 Approximation:    Approximation:  Close Repair type:    Repair type:  Simple Post-procedure details:    Dressing:  Open (no dressing)   Procedure completion:  Tolerated well, no immediate complications   (  including critical care time)  Medications Ordered in ED Medications  Tdap (BOOSTRIX) injection 0.5 mL (0.5 mLs Intramuscular Given 06/13/20 0737)  lidocaine-EPINEPHrine (XYLOCAINE W/EPI) 2 %-1:200000 (PF) injection 10 mL (10 mLs Infiltration Given 06/13/20 0916)  fosfomycin (MONUROL) packet 3 g (3 g Oral Given 06/13/20 1513)    ED Course  I have reviewed the triage vital signs and the nursing notes.  Pertinent labs & imaging results  that were available during my care of the patient were reviewed by me and considered in my medical decision making (see chart for details).    MDM Rules/Calculators/A&P                          Patient with fall.  Mechanical.  Hit her head.  However has been more unsteady and urine shows likely UTI.  Will treat.  Does have some drug allergies and after discussion with pharmacy will treat with fosfomycin.  Patient is on Coumadin for mechanical valve.  Initial head CT showed small subdural on the falx.  Discussed with neurosurgery who reviewed the imaging.  We thought a 6-hour delta CT would help show some stability.  Patient's daughter is a ICU nurse and will be able to monitor at home.  Repeat CT done and shows no worsening of the subdural.  Discharge home. Staples on laceration can be taken out in around 10 days.  Final Clinical Impression(s) / ED Diagnoses Final diagnoses:  Fall, initial encounter  Subdural hematoma ([place])  Scalp laceration, initial encounter  Urinary tract infection without hematuria, site unspecified    Rx / DC Orders ED Discharge Orders    None       Davonna Belling, MD 06/13/20 1520

## 2020-06-13 NOTE — ED Notes (Signed)
Back from CT

## 2020-06-13 NOTE — Telephone Encounter (Signed)
FYI  Initial Comment Caller from [place] to report a couple of falls a patient had Additional Comment She had a fall on 06/09/2020 and 06/11/2020. She has a little bruising on her buttocks.

## 2020-06-13 NOTE — ED Triage Notes (Signed)
Pt states she was walking lost her balance and fell backwards and hit her head on the door  Pt has a laceration noted to the back of her head  Pt takes coumadin  Pt denies LOC

## 2020-06-15 ENCOUNTER — Telehealth: Payer: Self-pay | Admitting: Family Medicine

## 2020-06-15 LAB — URINE CULTURE: Culture: >=100000 — AB

## 2020-06-15 NOTE — Telephone Encounter (Signed)
Spoke with Charlett Nose and verbal order was given.

## 2020-06-15 NOTE — Telephone Encounter (Signed)
Caller: Charlett Nose ([place]) Call back # [phone]  Verbal Order for Speech Therapy  Would like to extended order  2 times a week for 2 weeks  Ok to leave a voice mail

## 2020-06-21 ENCOUNTER — Telehealth: Payer: Self-pay | Admitting: Family Medicine

## 2020-06-21 DIAGNOSIS — D699 Hemorrhagic condition, unspecified: Secondary | ICD-10-CM

## 2020-06-21 DIAGNOSIS — E0590 Thyrotoxicosis, unspecified without thyrotoxic crisis or storm: Secondary | ICD-10-CM

## 2020-06-21 DIAGNOSIS — E1142 Type 2 diabetes mellitus with diabetic polyneuropathy: Secondary | ICD-10-CM

## 2020-06-21 NOTE — Telephone Encounter (Signed)
Per speech therapist Bethena Roys , Patient fell twice with left side bruising. Therapist is making provider aware .

## 2020-06-21 NOTE — Telephone Encounter (Signed)
So since she keeps falling we could have a round of PT/OT to help with strength and see if they think some adaptive equipment might make her safer or we could do a VV to discuss options.

## 2020-06-22 ENCOUNTER — Other Ambulatory Visit: Payer: Self-pay | Admitting: Family Medicine

## 2020-06-22 MED ORDER — AMOXICILLIN 500 MG PO CAPS: 500.0000 mg | ORAL_CAPSULE | Freq: Three times a day (TID) | ORAL | 0 refills | Status: DC

## 2020-06-22 NOTE — Telephone Encounter (Signed)
Mailbox full could not leave message 

## 2020-06-22 NOTE — Telephone Encounter (Signed)
Yes please add the labs Dr Tamala Julian requested and I will send in some Amoxicillin 500 mg to take three times a day if she worsens over the weekend. The preference is that we get a urine sample before she starts the antibiotic if possible.

## 2020-06-22 NOTE — Telephone Encounter (Signed)
Spoke with Bethena Roys.  Patient ins will only cover so many visits. Pt has 2 more visits left.  She stated that they think that this may be more of a cognitive issue than physical.  They have been rearranging things in the house. Pt will go about 4-5 days without a fall and the falls start again.  She stated that patient tends to do things unsafely and why they think this may be a cognitive issue.

## 2020-06-22 NOTE — Telephone Encounter (Signed)
I will defer to team Charlett Blake- I don't know this Pt well enough to answer super specific questions regarding Pt.

## 2020-06-22 NOTE — Telephone Encounter (Signed)
Please also review note from OT/PT  Spoke with patient daughter and she wanted to get an antibiotic for UTI because patient is having confusion and frequent falls.  She stated that patient has had over a half dozen falls.  I advised her that we will need to get a urine.  We agreed upon a lab visit for next week 2/1, will order urine.  She also wanted more lab work that was requested by the cardiologist, Dr. Tamala Julian, for TSH, liver enzymes, INR, and A1C.  Pt had an lab only appt scheduled for 2/8 for 27month lab appt.  Those labs consists of Intrinsic factor, b12, cmp, and cbc w/diff.  Are you ok with ordering the urine and orders from Dr. Tamala Julian?  She has a follow up on 2/17 with you.

## 2020-06-22 NOTE — Telephone Encounter (Signed)
Daughter notified 

## 2020-06-22 NOTE — Addendum Note (Signed)
Addended by: Kem Boroughs D on: 06/22/2020 03:53 PM   Modules accepted: Orders

## 2020-06-22 NOTE — Telephone Encounter (Signed)
Yes due a cmp that includes the liver enzymes

## 2020-06-22 NOTE — Telephone Encounter (Signed)
[name] call back stating she has other questions call her back at [phone]

## 2020-06-22 NOTE — Telephone Encounter (Signed)
For the liver enzymes will cmp be ok or do you want the liver panel?

## 2020-06-22 NOTE — Telephone Encounter (Signed)
LMOM informing Bethena Roys to add OT/PT and see if someone can come out to home to help w/ adaptive equipment. Instructed to call if questions/concerns.

## 2020-06-22 NOTE — Telephone Encounter (Signed)
Patient's daughter is returning a call. Patient daughter would like to know if Dr Charlett Blake could maybe  precribe the patient an antibiotic. Daughter believes that patient falls are related to chronic UTIs

## 2020-06-25 NOTE — Telephone Encounter (Signed)
All orders are in.  Some orders where placed in December so we will just use those.

## 2020-06-26 ENCOUNTER — Telehealth: Payer: Self-pay

## 2020-06-26 ENCOUNTER — Telehealth: Payer: Self-pay | Admitting: Family Medicine

## 2020-06-26 ENCOUNTER — Other Ambulatory Visit (INDEPENDENT_AMBULATORY_CARE_PROVIDER_SITE_OTHER): Payer: HMO

## 2020-06-26 ENCOUNTER — Other Ambulatory Visit: Payer: Self-pay

## 2020-06-26 DIAGNOSIS — E0500 Thyrotoxicosis with diffuse goiter without thyrotoxic crisis or storm: Secondary | ICD-10-CM | POA: Diagnosis not present

## 2020-06-26 DIAGNOSIS — D699 Hemorrhagic condition, unspecified: Secondary | ICD-10-CM

## 2020-06-26 DIAGNOSIS — E538 Deficiency of other specified B group vitamins: Secondary | ICD-10-CM

## 2020-06-26 DIAGNOSIS — M109 Gout, unspecified: Secondary | ICD-10-CM | POA: Diagnosis not present

## 2020-06-26 DIAGNOSIS — I4891 Unspecified atrial fibrillation: Secondary | ICD-10-CM | POA: Diagnosis not present

## 2020-06-26 DIAGNOSIS — R55 Syncope and collapse: Secondary | ICD-10-CM | POA: Diagnosis not present

## 2020-06-26 DIAGNOSIS — E782 Mixed hyperlipidemia: Secondary | ICD-10-CM | POA: Diagnosis not present

## 2020-06-26 DIAGNOSIS — R350 Frequency of micturition: Secondary | ICD-10-CM | POA: Diagnosis not present

## 2020-06-26 DIAGNOSIS — E0590 Thyrotoxicosis, unspecified without thyrotoxic crisis or storm: Secondary | ICD-10-CM | POA: Diagnosis not present

## 2020-06-26 DIAGNOSIS — E1142 Type 2 diabetes mellitus with diabetic polyneuropathy: Secondary | ICD-10-CM | POA: Diagnosis not present

## 2020-06-26 DIAGNOSIS — F319 Bipolar disorder, unspecified: Secondary | ICD-10-CM | POA: Diagnosis not present

## 2020-06-26 DIAGNOSIS — M545 Low back pain, unspecified: Secondary | ICD-10-CM | POA: Diagnosis not present

## 2020-06-26 DIAGNOSIS — R9431 Abnormal electrocardiogram [ECG] [EKG]: Secondary | ICD-10-CM | POA: Diagnosis not present

## 2020-06-26 DIAGNOSIS — D649 Anemia, unspecified: Secondary | ICD-10-CM | POA: Diagnosis not present

## 2020-06-26 DIAGNOSIS — I4892 Unspecified atrial flutter: Secondary | ICD-10-CM | POA: Diagnosis not present

## 2020-06-26 DIAGNOSIS — I5042 Chronic combined systolic (congestive) and diastolic (congestive) heart failure: Secondary | ICD-10-CM | POA: Diagnosis not present

## 2020-06-26 DIAGNOSIS — I110 Hypertensive heart disease with heart failure: Secondary | ICD-10-CM | POA: Diagnosis not present

## 2020-06-26 DIAGNOSIS — G43909 Migraine, unspecified, not intractable, without status migrainosus: Secondary | ICD-10-CM | POA: Diagnosis not present

## 2020-06-26 NOTE — Addendum Note (Signed)
Addended by: Randolm Idol A on: 06/26/2020 03:22 PM   Modules accepted: Orders

## 2020-06-26 NOTE — Telephone Encounter (Signed)
Attempted to reach Charlett Nose to give verbal orders for patient.

## 2020-06-26 NOTE — Telephone Encounter (Signed)
Charlett Nose called back.. please leave a detail message if you get voicemail

## 2020-06-26 NOTE — Telephone Encounter (Signed)
Attempted to reach [name] to give verbal orders for patient. 

## 2020-06-26 NOTE — Telephone Encounter (Signed)
Caller: [facility] ([facility] Call back # [phone]  Calling to report a fall on 06/25/20 no injuries.   Need verbal order to extend ST  Need ok to moved this week appt to next week

## 2020-06-27 LAB — COMPREHENSIVE METABOLIC PANEL
ALT: 26 U/L (ref 0–35)
AST: 34 U/L (ref 0–37)
Albumin: 4.1 g/dL (ref 3.5–5.2)
Alkaline Phosphatase: 54 U/L (ref 39–117)
BUN: 14 mg/dL (ref 6–23)
CO2: 32 mEq/L (ref 19–32)
Calcium: 9.3 mg/dL (ref 8.4–10.5)
Chloride: 101 mEq/L (ref 96–112)
Creatinine, Ser: 1.16 mg/dL (ref 0.40–1.20)
GFR: 48.08 mL/min — ABNORMAL LOW (ref >60.00)
Glucose, Bld: 84 mg/dL (ref 70–99)
Potassium: 4 mEq/L (ref 3.5–5.1)
Sodium: 140 mEq/L (ref 135–145)
Total Bilirubin: 0.8 mg/dL (ref 0.2–1.2)
Total Protein: 6.9 g/dL (ref 6.0–8.3)

## 2020-06-27 LAB — CBC WITH DIFFERENTIAL/PLATELET
Basophils Absolute: 0.1 10*3/uL (ref 0.0–0.1)
Basophils Relative: 1.2 % (ref 0.0–3.0)
Eosinophils Absolute: 0.1 10*3/uL (ref 0.0–0.7)
Eosinophils Relative: 2.1 % (ref 0.0–5.0)
HCT: 32.4 % — ABNORMAL LOW (ref 36.0–46.0)
Hemoglobin: 10.8 g/dL — ABNORMAL LOW (ref 12.0–15.0)
Lymphocytes Relative: 16.2 % (ref 12.0–46.0)
Lymphs Abs: 0.9 10*3/uL (ref 0.7–4.0)
MCHC: 33.4 g/dL (ref 30.0–36.0)
MCV: 93.1 fl (ref 78.0–100.0)
Monocytes Absolute: 0.4 10*3/uL (ref 0.1–1.0)
Monocytes Relative: 7.5 % (ref 3.0–12.0)
Neutro Abs: 3.9 10*3/uL (ref 1.4–7.7)
Neutrophils Relative %: 73 % (ref 43.0–77.0)
Platelets: 217 10*3/uL (ref 150.0–400.0)
RBC: 3.48 Mil/uL — ABNORMAL LOW (ref 3.87–5.11)
RDW: 14.7 % (ref 11.5–15.5)
WBC: 5.3 10*3/uL (ref 4.0–10.5)

## 2020-06-27 LAB — PROTIME-INR
INR: 2.4 ratio — ABNORMAL HIGH (ref 0.8–1.0)
Prothrombin Time: 26.1 s — ABNORMAL HIGH (ref 9.6–13.1)

## 2020-06-27 LAB — URINE CULTURE
MICRO NUMBER:: 11481321
SPECIMEN QUALITY:: ADEQUATE

## 2020-06-27 LAB — VITAMIN B12: Vitamin B-12: 761 pg/mL (ref 211–911)

## 2020-06-27 LAB — TSH: TSH: 3.43 u[IU]/mL (ref 0.35–4.50)

## 2020-06-27 LAB — HEMOGLOBIN A1C: Hgb A1c MFr Bld: 5 % (ref 4.6–6.5)

## 2020-06-27 NOTE — Telephone Encounter (Signed)
Verbal given 

## 2020-06-28 DIAGNOSIS — I10 Essential (primary) hypertension: Secondary | ICD-10-CM | POA: Diagnosis not present

## 2020-06-28 DIAGNOSIS — R0989 Other specified symptoms and signs involving the circulatory and respiratory systems: Secondary | ICD-10-CM | POA: Diagnosis not present

## 2020-06-28 DIAGNOSIS — E1122 Type 2 diabetes mellitus with diabetic chronic kidney disease: Secondary | ICD-10-CM | POA: Diagnosis not present

## 2020-06-28 DIAGNOSIS — I5043 Acute on chronic combined systolic (congestive) and diastolic (congestive) heart failure: Secondary | ICD-10-CM | POA: Diagnosis not present

## 2020-06-28 DIAGNOSIS — G935 Compression of brain: Secondary | ICD-10-CM | POA: Diagnosis not present

## 2020-06-28 DIAGNOSIS — N179 Acute kidney failure, unspecified: Secondary | ICD-10-CM | POA: Diagnosis not present

## 2020-06-28 DIAGNOSIS — I480 Paroxysmal atrial fibrillation: Secondary | ICD-10-CM | POA: Diagnosis not present

## 2020-06-28 DIAGNOSIS — I517 Cardiomegaly: Secondary | ICD-10-CM | POA: Diagnosis not present

## 2020-06-28 DIAGNOSIS — Z79899 Other long term (current) drug therapy: Secondary | ICD-10-CM | POA: Diagnosis not present

## 2020-06-28 DIAGNOSIS — I130 Hypertensive heart and chronic kidney disease with heart failure and stage 1 through stage 4 chronic kidney disease, or unspecified chronic kidney disease: Secondary | ICD-10-CM | POA: Diagnosis not present

## 2020-06-28 DIAGNOSIS — S066X0A Traumatic subarachnoid hemorrhage without loss of consciousness, initial encounter: Secondary | ICD-10-CM | POA: Diagnosis not present

## 2020-06-28 DIAGNOSIS — S06A0XA Traumatic brain compression without herniation, initial encounter: Secondary | ICD-10-CM | POA: Diagnosis not present

## 2020-06-28 DIAGNOSIS — R4182 Altered mental status, unspecified: Secondary | ICD-10-CM | POA: Diagnosis not present

## 2020-06-28 DIAGNOSIS — R296 Repeated falls: Secondary | ICD-10-CM | POA: Diagnosis not present

## 2020-06-28 DIAGNOSIS — W01198A Fall on same level from slipping, tripping and stumbling with subsequent striking against other object, initial encounter: Secondary | ICD-10-CM | POA: Diagnosis not present

## 2020-06-28 DIAGNOSIS — Z794 Long term (current) use of insulin: Secondary | ICD-10-CM | POA: Diagnosis not present

## 2020-06-28 DIAGNOSIS — I6200 Nontraumatic subdural hemorrhage, unspecified: Secondary | ICD-10-CM | POA: Diagnosis not present

## 2020-06-28 DIAGNOSIS — E119 Type 2 diabetes mellitus without complications: Secondary | ICD-10-CM | POA: Diagnosis not present

## 2020-06-28 DIAGNOSIS — R569 Unspecified convulsions: Secondary | ICD-10-CM | POA: Diagnosis not present

## 2020-06-28 DIAGNOSIS — G9389 Other specified disorders of brain: Secondary | ICD-10-CM | POA: Diagnosis not present

## 2020-06-28 DIAGNOSIS — S065X9A Traumatic subdural hemorrhage with loss of consciousness of unspecified duration, initial encounter: Secondary | ICD-10-CM | POA: Diagnosis not present

## 2020-06-28 DIAGNOSIS — I609 Nontraumatic subarachnoid hemorrhage, unspecified: Secondary | ICD-10-CM | POA: Diagnosis not present

## 2020-06-28 DIAGNOSIS — J9601 Acute respiratory failure with hypoxia: Secondary | ICD-10-CM | POA: Diagnosis not present

## 2020-06-28 DIAGNOSIS — S199XXA Unspecified injury of neck, initial encounter: Secondary | ICD-10-CM | POA: Diagnosis not present

## 2020-06-28 DIAGNOSIS — N1831 Chronic kidney disease, stage 3a: Secondary | ICD-10-CM | POA: Diagnosis not present

## 2020-06-28 DIAGNOSIS — Z9889 Other specified postprocedural states: Secondary | ICD-10-CM | POA: Diagnosis not present

## 2020-06-28 DIAGNOSIS — R4701 Aphasia: Secondary | ICD-10-CM | POA: Diagnosis not present

## 2020-06-28 DIAGNOSIS — W19XXXA Unspecified fall, initial encounter: Secondary | ICD-10-CM | POA: Diagnosis not present

## 2020-06-28 DIAGNOSIS — S3993XA Unspecified injury of pelvis, initial encounter: Secondary | ICD-10-CM | POA: Diagnosis not present

## 2020-06-28 DIAGNOSIS — S065X0A Traumatic subdural hemorrhage without loss of consciousness, initial encounter: Secondary | ICD-10-CM | POA: Diagnosis not present

## 2020-06-28 DIAGNOSIS — I495 Sick sinus syndrome: Secondary | ICD-10-CM | POA: Diagnosis not present

## 2020-06-28 DIAGNOSIS — G8911 Acute pain due to trauma: Secondary | ICD-10-CM | POA: Diagnosis not present

## 2020-06-28 DIAGNOSIS — N390 Urinary tract infection, site not specified: Secondary | ICD-10-CM | POA: Diagnosis not present

## 2020-06-28 DIAGNOSIS — Z5181 Encounter for therapeutic drug level monitoring: Secondary | ICD-10-CM | POA: Diagnosis not present

## 2020-06-28 DIAGNOSIS — G936 Cerebral edema: Secondary | ICD-10-CM | POA: Diagnosis not present

## 2020-06-28 DIAGNOSIS — I712 Thoracic aortic aneurysm, without rupture: Secondary | ICD-10-CM | POA: Diagnosis not present

## 2020-06-28 DIAGNOSIS — E039 Hypothyroidism, unspecified: Secondary | ICD-10-CM | POA: Diagnosis not present

## 2020-06-28 DIAGNOSIS — Z952 Presence of prosthetic heart valve: Secondary | ICD-10-CM | POA: Diagnosis not present

## 2020-06-28 DIAGNOSIS — R402343 Coma scale, best motor response, flexion withdrawal, at hospital admission: Secondary | ICD-10-CM | POA: Diagnosis not present

## 2020-06-28 DIAGNOSIS — R402213 Coma scale, best verbal response, none, at hospital admission: Secondary | ICD-10-CM | POA: Diagnosis not present

## 2020-06-28 DIAGNOSIS — S061X9A Traumatic cerebral edema with loss of consciousness of unspecified duration, initial encounter: Secondary | ICD-10-CM | POA: Diagnosis not present

## 2020-06-28 DIAGNOSIS — D62 Acute posthemorrhagic anemia: Secondary | ICD-10-CM | POA: Diagnosis not present

## 2020-06-28 DIAGNOSIS — R29810 Facial weakness: Secondary | ICD-10-CM | POA: Diagnosis not present

## 2020-06-28 DIAGNOSIS — S0990XA Unspecified injury of head, initial encounter: Secondary | ICD-10-CM | POA: Diagnosis present

## 2020-06-28 DIAGNOSIS — R791 Abnormal coagulation profile: Secondary | ICD-10-CM | POA: Diagnosis not present

## 2020-06-28 DIAGNOSIS — Z7982 Long term (current) use of aspirin: Secondary | ICD-10-CM | POA: Diagnosis not present

## 2020-06-28 DIAGNOSIS — Z950 Presence of cardiac pacemaker: Secondary | ICD-10-CM | POA: Diagnosis not present

## 2020-06-28 DIAGNOSIS — Z20822 Contact with and (suspected) exposure to covid-19: Secondary | ICD-10-CM | POA: Diagnosis not present

## 2020-06-28 DIAGNOSIS — S06340A Traumatic hemorrhage of right cerebrum without loss of consciousness, initial encounter: Secondary | ICD-10-CM | POA: Diagnosis not present

## 2020-06-28 DIAGNOSIS — R2681 Unsteadiness on feet: Secondary | ICD-10-CM | POA: Diagnosis not present

## 2020-06-28 DIAGNOSIS — Z7901 Long term (current) use of anticoagulants: Secondary | ICD-10-CM | POA: Diagnosis not present

## 2020-06-28 DIAGNOSIS — R402133 Coma scale, eyes open, to sound, at hospital admission: Secondary | ICD-10-CM | POA: Diagnosis not present

## 2020-06-28 LAB — INTRINSIC FACTOR ANTIBODIES: Intrinsic Factor: NEGATIVE

## 2020-06-29 ENCOUNTER — Telehealth: Payer: Self-pay | Admitting: Family Medicine

## 2020-06-29 NOTE — Telephone Encounter (Signed)
Spoke to the patients daughter today 06/29/20 and patient has had a serious fall in [facility] while visiting family. The patient  is recovering from brain surgery.  The daughter would love a call from PCP.

## 2020-07-01 NOTE — Telephone Encounter (Signed)
Spoke with daughter patient is recovering from surgery after her traumatic brain injury with bleeding. Patient is regaining some function and language

## 2020-07-03 ENCOUNTER — Telehealth: Payer: Self-pay | Admitting: Internal Medicine

## 2020-07-03 ENCOUNTER — Other Ambulatory Visit: Payer: PPO

## 2020-07-03 NOTE — Telephone Encounter (Signed)
Calling about fax that was sent yesterday regarding her device and MRI. Located fax and gave to Amy with device clinic. Informed Dr. Denice Paradise that the form will be completed and faxed back today.  Grateful for call back and information regarding fax.

## 2020-07-03 NOTE — Telephone Encounter (Signed)
Dr. Lottie Rater is calling regarding the patient. Sent in a form via fax but have not received form back. Sent call to triage

## 2020-07-04 ENCOUNTER — Telehealth: Payer: Self-pay | Admitting: Psychiatry

## 2020-07-04 NOTE — Telephone Encounter (Signed)
Returned call to daughter, Amy. Amy reports that on January 19th pt fell and sustained a head injury requiring staples and had slight subdural hematoma. She h ad some double vision after this fall.   Pt fell Thursday in [facility] while visiting Amy's brother. Pt fell and hit her head and did not immediately tell anyone. Pt called daughter and then became non-responsive while on the phone with her daughter. Pt sustained multiple brain hemorrhages and had to have craniotomy. Off ventilator. A&Ox4 now and walking 90 feet with PT. Continues to be off balance.   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   Daughter reports that pt continues to have frequent falls and UTI's. Daughter reports that pt is not consistent with using walker.   Daughter reports that pt was more stable on her feet after decrease in Seroquel XR. Daughter reports that she was not stumbling around in the morning. Pt was sleeping well and her mood was improved.   Daughter reports that pt will likely require rehab at a SNF after hospital. Daughter is pursuing [place] Medicaid and is requesting letter documenting pt's medical condition. Will provide daughter with letter.   Will re-schedule apt scheduled for next week to late March. Encouraged pt's daughter to contact office if pt needs to be seen sooner.

## 2020-07-04 NOTE — Telephone Encounter (Signed)
Patient is requesting a call back from Dr Charlett Blake is reference to her mother [name]. [name] is still in the hospital  Recovering from surgery. Patient would like to discuss possibly placing her mother in a facilities after d/c

## 2020-07-04 NOTE — Telephone Encounter (Signed)
[name]'s daughter [name] called to inform you that her mom is in the hospital at [place], [name]. She went down to visit her brother. She fell and is currently in ICU and has had a craniotomy for three brain bleeds. She has an appt with you on 07/11/20. [name] and her brother say she will probably have to start with skilled nursing and then not sure what the future holds. She wanted me to ask you if she should bring her mother in for this appt if she is discharged from the hospital or does she need to R/S? [name]'s phone number is [phone].

## 2020-07-05 ENCOUNTER — Ambulatory Visit (INDEPENDENT_AMBULATORY_CARE_PROVIDER_SITE_OTHER): Payer: Self-pay

## 2020-07-05 ENCOUNTER — Telehealth: Payer: Self-pay

## 2020-07-05 DIAGNOSIS — I4891 Unspecified atrial fibrillation: Secondary | ICD-10-CM

## 2020-07-05 DIAGNOSIS — Z952 Presence of prosthetic heart valve: Secondary | ICD-10-CM

## 2020-07-05 DIAGNOSIS — Z5181 Encounter for therapeutic drug level monitoring: Secondary | ICD-10-CM

## 2020-07-05 NOTE — Telephone Encounter (Signed)
Please let her know I am out of town til next week so getting to the phone will be tricky. She should start talking to the discharge planners/social workers at the hospital about what her options are and start looking into local nursing homes herself at least online and if any seem of interest she can tour and they will be happy to talk to her about payment etc. Unfortunately I do not know much about the actual nursing homes and how to resource paying for them.

## 2020-07-05 NOTE — Telephone Encounter (Signed)
OK start her a letter that states patient with complex medical history and then type in all of the concerns on her problem list. Recently after recurrent falls she fell and suffered a traumatic brain injury and brain bleed requiring vaccination. She is now left with significant neurologic deficits including weakness and language difficulties requiring her to have 24/7 care to accomplish her activities of daily living. She will require more assistance than can be managed in her home setting. Please feel free to contact us with any further questions. Then I can review later thanks

## 2020-07-05 NOTE — Telephone Encounter (Signed)
[name] states that she's working with social workers from the hospital. She was wondering if you could write her a letter for long term medicaid  and if you could highlight in letter all the problems and issues she been having.

## 2020-07-05 NOTE — Patient Instructions (Signed)
Description   Pt had INR checked on 06/26/20 at Dr Frederik Pear office, was supposed to be done on 07/03/20 we were not notified of date change,  just seeing results today 07/05/20. Called spoke with pt's daughter pt is in hospital in [place], [name] s/p fall 06/28/20 requiring hospitalization and craniotomy.  Pt is off Warfarin at present.  [name] will call when pt discharged.  Amiodarone decreased to once daily on 06/08/20.  Call coumadin clinic for any changes in medications or up coming procedures.

## 2020-07-05 NOTE — Telephone Encounter (Signed)
Letter done and just awaiting provider signature  Amy notified that letter will be ready on Tuesday.  Please call when ready to sign.

## 2020-07-05 NOTE — Telephone Encounter (Signed)
Called spoke with pt's daughter, regarding INR result from Dr Frederik Pear office on 06/26/20.  [name] [name] reports pt is in hospital in [name], [name] s/p fall 06/28/20, pt hit her head requiring hospitalization and craniotomy.  [name] states pt has fallen 27 times in January 2022.  One other fall 06/13/20 in which pt hit the back of her head requiring hospitalization, for subdural hematoma and staples. [name] states due to present deficits s/p head injury, she does not think she will be able to continue to care for pt at home.  She is applying for long term Medicaid so pt can be placed in a SNF.  [name] [name] is requesting I forward a message to Dr Tamala Julian and his nurse to notify them of what is happening and request a letter be written on [name]'s behalf for pt's daughter to give to [facility] office outlining all of the pt's current medical diagnoses.  I advised her I would forward a message and have them give her call to discuss if they are able to provide this letter and what exactly needs to be included in the letter to [name]. Will forward message to be addressed.

## 2020-07-10 ENCOUNTER — Telehealth: Payer: Self-pay | Admitting: Interventional Cardiology

## 2020-07-10 ENCOUNTER — Other Ambulatory Visit: Payer: Self-pay

## 2020-07-10 MED ORDER — FUROSEMIDE 40 MG PO TABS: 40.0000 mg | ORAL_TABLET | Freq: Every day | ORAL | 3 refills | Status: DC

## 2020-07-10 NOTE — Telephone Encounter (Signed)
Daughter states she will only be in the area around 0215pm would like to pick up letter

## 2020-07-10 NOTE — Telephone Encounter (Signed)
[name] picked up letter

## 2020-07-10 NOTE — Telephone Encounter (Signed)
Spoke with daughter and made her aware that previous message was sent to Dr. Tamala Julian.  Advised once he reviews I will be in contact in regards to the letter.  Daughter appreciative for call.

## 2020-07-10 NOTE — Telephone Encounter (Signed)
   [name] is calling to request if Dr. Tamala Julian can make a letter detailing pt medical heart condition, with her mechanical heart valve, pacemaker etc. She said including outline of pt's extensive cardiac history. She needs to send it to medicaid they're trying to bring pt to a long term facility, the reason why they want to do this because pt fell and hit her head and end up getting craniotomy. She asked if she can pick the letter this week.

## 2020-07-11 ENCOUNTER — Ambulatory Visit: Payer: PPO | Admitting: Psychiatry

## 2020-07-11 NOTE — Telephone Encounter (Signed)
This is a letter that should be forwarded to the daughter of the patient concerning her medical condition.  To whom it may concern,  [name] (date of birth 1951-01-16) is a patient with longstanding heart disease.  She has a history of aortic valve replacement with a mechanical prosthesis 2011, anticoagulation with Coumadin, paroxysmal atrial fibrillation, type 2 diabetes mellitus, permanent pacemaker implantation August 2021, and most recently amiodarone therapy for rhythm control.  Her other noncardiac problems include bipolar disorder, anemia, and recently frailty with multiple falls.  Because of the multiple falls, she has suffered significant head trauma while on anticoagulation therapy resulting in neurological dysfunction and inability to care for herself.  Unfortunately, she will need to remain on anticoagulation therapy due to the mechanical valve which will thrombose without therapy.  If there are questions concerning her cardiovascular disease state, do not hesitate to contact us.  Respectfully yours,   Illene Labrador, III, MD

## 2020-07-12 ENCOUNTER — Ambulatory Visit: Payer: HMO | Admitting: Family Medicine

## 2020-07-12 ENCOUNTER — Encounter: Payer: Self-pay | Admitting: *Deleted

## 2020-07-12 DIAGNOSIS — Z0289 Encounter for other administrative examinations: Secondary | ICD-10-CM

## 2020-07-12 NOTE — Telephone Encounter (Signed)
Spoke with daughter and made her that letter is ready and posted in [place].  Daughter states they need a signed copy.  Advised Dr. Tamala Julian is not back in the office until Tuesday.  Daughter will plan to pick up letter at front desk on Thursday.

## 2020-07-17 NOTE — Telephone Encounter (Signed)
Letter completed and placed at front desk

## 2020-07-23 ENCOUNTER — Emergency Department (HOSPITAL_COMMUNITY): Payer: HMO

## 2020-07-23 ENCOUNTER — Encounter (HOSPITAL_COMMUNITY): Payer: Self-pay | Admitting: Emergency Medicine

## 2020-07-23 ENCOUNTER — Observation Stay (HOSPITAL_COMMUNITY)
Admission: EM | Admit: 2020-07-23 | Discharge: 2020-07-25 | Disposition: A | Discharge status: Home / Self Care | Payer: HMO | Attending: Internal Medicine | Admitting: Internal Medicine

## 2020-07-23 DIAGNOSIS — N1832 Chronic kidney disease, stage 3b: Secondary | ICD-10-CM | POA: Diagnosis present

## 2020-07-23 DIAGNOSIS — Z79899 Other long term (current) drug therapy: Secondary | ICD-10-CM | POA: Insufficient documentation

## 2020-07-23 DIAGNOSIS — G9389 Other specified disorders of brain: Secondary | ICD-10-CM | POA: Diagnosis not present

## 2020-07-23 DIAGNOSIS — W19XXXA Unspecified fall, initial encounter: Secondary | ICD-10-CM

## 2020-07-23 DIAGNOSIS — Z7982 Long term (current) use of aspirin: Secondary | ICD-10-CM | POA: Insufficient documentation

## 2020-07-23 DIAGNOSIS — S066X9A Traumatic subarachnoid hemorrhage with loss of consciousness of unspecified duration, initial encounter: Secondary | ICD-10-CM | POA: Diagnosis present

## 2020-07-23 DIAGNOSIS — Z7901 Long term (current) use of anticoagulants: Secondary | ICD-10-CM | POA: Insufficient documentation

## 2020-07-23 DIAGNOSIS — I10 Essential (primary) hypertension: Secondary | ICD-10-CM | POA: Diagnosis present

## 2020-07-23 DIAGNOSIS — Z950 Presence of cardiac pacemaker: Secondary | ICD-10-CM | POA: Insufficient documentation

## 2020-07-23 DIAGNOSIS — S0990XA Unspecified injury of head, initial encounter: Secondary | ICD-10-CM | POA: Diagnosis present

## 2020-07-23 DIAGNOSIS — I4891 Unspecified atrial fibrillation: Secondary | ICD-10-CM | POA: Diagnosis present

## 2020-07-23 DIAGNOSIS — S065X9A Traumatic subdural hemorrhage with loss of consciousness of unspecified duration, initial encounter: Secondary | ICD-10-CM | POA: Diagnosis present

## 2020-07-23 DIAGNOSIS — I130 Hypertensive heart and chronic kidney disease with heart failure and stage 1 through stage 4 chronic kidney disease, or unspecified chronic kidney disease: Secondary | ICD-10-CM | POA: Insufficient documentation

## 2020-07-23 DIAGNOSIS — N1831 Chronic kidney disease, stage 3a: Secondary | ICD-10-CM | POA: Insufficient documentation

## 2020-07-23 DIAGNOSIS — R2681 Unsteadiness on feet: Secondary | ICD-10-CM | POA: Diagnosis not present

## 2020-07-23 DIAGNOSIS — W01198A Fall on same level from slipping, tripping and stumbling with subsequent striking against other object, initial encounter: Secondary | ICD-10-CM | POA: Insufficient documentation

## 2020-07-23 DIAGNOSIS — Z20822 Contact with and (suspected) exposure to covid-19: Secondary | ICD-10-CM | POA: Diagnosis not present

## 2020-07-23 DIAGNOSIS — K219 Gastro-esophageal reflux disease without esophagitis: Secondary | ICD-10-CM | POA: Diagnosis present

## 2020-07-23 DIAGNOSIS — Y92009 Unspecified place in unspecified non-institutional (private) residence as the place of occurrence of the external cause: Secondary | ICD-10-CM

## 2020-07-23 DIAGNOSIS — Z794 Long term (current) use of insulin: Secondary | ICD-10-CM | POA: Diagnosis not present

## 2020-07-23 DIAGNOSIS — S066X0A Traumatic subarachnoid hemorrhage without loss of consciousness, initial encounter: Secondary | ICD-10-CM | POA: Diagnosis not present

## 2020-07-23 DIAGNOSIS — S065X0A Traumatic subdural hemorrhage without loss of consciousness, initial encounter: Secondary | ICD-10-CM | POA: Diagnosis not present

## 2020-07-23 DIAGNOSIS — I5043 Acute on chronic combined systolic (congestive) and diastolic (congestive) heart failure: Secondary | ICD-10-CM | POA: Diagnosis present

## 2020-07-23 DIAGNOSIS — S066XAA Traumatic subarachnoid hemorrhage with loss of consciousness status unknown, initial encounter: Secondary | ICD-10-CM | POA: Diagnosis present

## 2020-07-23 DIAGNOSIS — S06340A Traumatic hemorrhage of right cerebrum without loss of consciousness, initial encounter: Secondary | ICD-10-CM

## 2020-07-23 DIAGNOSIS — Z952 Presence of prosthetic heart valve: Secondary | ICD-10-CM

## 2020-07-23 DIAGNOSIS — N3000 Acute cystitis without hematuria: Secondary | ICD-10-CM | POA: Diagnosis present

## 2020-07-23 DIAGNOSIS — E039 Hypothyroidism, unspecified: Secondary | ICD-10-CM | POA: Diagnosis present

## 2020-07-23 DIAGNOSIS — Z9889 Other specified postprocedural states: Secondary | ICD-10-CM | POA: Diagnosis not present

## 2020-07-23 DIAGNOSIS — E1122 Type 2 diabetes mellitus with diabetic chronic kidney disease: Secondary | ICD-10-CM | POA: Diagnosis not present

## 2020-07-23 DIAGNOSIS — F319 Bipolar disorder, unspecified: Secondary | ICD-10-CM | POA: Diagnosis present

## 2020-07-23 DIAGNOSIS — S065XAA Traumatic subdural hemorrhage with loss of consciousness status unknown, initial encounter: Secondary | ICD-10-CM | POA: Diagnosis present

## 2020-07-23 DIAGNOSIS — I517 Cardiomegaly: Secondary | ICD-10-CM | POA: Diagnosis not present

## 2020-07-23 DIAGNOSIS — N179 Acute kidney failure, unspecified: Secondary | ICD-10-CM | POA: Diagnosis present

## 2020-07-23 LAB — CBC WITH DIFFERENTIAL/PLATELET
Abs Immature Granulocytes: 0.04 10*3/uL (ref 0.00–0.07)
Basophils Absolute: 0.1 10*3/uL (ref 0.0–0.1)
Basophils Relative: 1 %
Eosinophils Absolute: 0.3 10*3/uL (ref 0.0–0.5)
Eosinophils Relative: 4 %
HCT: 35.3 % — ABNORMAL LOW (ref 36.0–46.0)
Hemoglobin: 11.3 g/dL — ABNORMAL LOW (ref 12.0–15.0)
Immature Granulocytes: 1 %
Lymphocytes Relative: 20 %
Lymphs Abs: 1.4 10*3/uL (ref 0.7–4.0)
MCH: 31.2 pg (ref 26.0–34.0)
MCHC: 32 g/dL (ref 30.0–36.0)
MCV: 97.5 fL (ref 80.0–100.0)
Monocytes Absolute: 0.9 10*3/uL (ref 0.1–1.0)
Monocytes Relative: 13 %
Neutro Abs: 4.3 10*3/uL (ref 1.7–7.7)
Neutrophils Relative %: 61 %
Platelets: 243 10*3/uL (ref 150–400)
RBC: 3.62 MIL/uL — ABNORMAL LOW (ref 3.87–5.11)
RDW: 14.5 % (ref 11.5–15.5)
WBC: 7 10*3/uL (ref 4.0–10.5)
nRBC: 0 % (ref 0.0–0.2)

## 2020-07-23 LAB — COMPREHENSIVE METABOLIC PANEL
ALT: 39 U/L (ref 0–44)
AST: 60 U/L — ABNORMAL HIGH (ref 15–41)
Albumin: 4.1 g/dL (ref 3.5–5.0)
Alkaline Phosphatase: 36 U/L — ABNORMAL LOW (ref 38–126)
Anion gap: 12 (ref 5–15)
BUN: 36 mg/dL — ABNORMAL HIGH (ref 8–23)
CO2: 25 mmol/L (ref 22–32)
Calcium: 9.7 mg/dL (ref 8.9–10.3)
Chloride: 99 mmol/L (ref 98–111)
Creatinine, Ser: 1.94 mg/dL — ABNORMAL HIGH (ref 0.44–1.00)
GFR, Estimated: 28 mL/min — ABNORMAL LOW (ref >60)
Glucose, Bld: 110 mg/dL — ABNORMAL HIGH (ref 70–99)
Potassium: 4.2 mmol/L (ref 3.5–5.1)
Sodium: 136 mmol/L (ref 135–145)
Total Bilirubin: 0.9 mg/dL (ref 0.3–1.2)
Total Protein: 7.8 g/dL (ref 6.5–8.1)

## 2020-07-23 LAB — TROPONIN I (HIGH SENSITIVITY): Troponin I (High Sensitivity): 16 ng/L (ref <18)

## 2020-07-23 LAB — PROTIME-INR
INR: 2.5 — ABNORMAL HIGH (ref 0.8–1.2)
Prothrombin Time: 26.1 seconds — ABNORMAL HIGH (ref 11.4–15.2)

## 2020-07-23 MED ORDER — SODIUM CHLORIDE 0.9 % IV BOLUS
1000.0000 mL | Freq: Once | INTRAVENOUS | Status: AC
  Administered 2020-07-23: 1000 mL via INTRAVENOUS

## 2020-07-23 NOTE — ED Notes (Addendum)
Patient has abrasions above and below the Left eye. Patient denies LOC. Pt states 0/10 pain. No other injuries noted. Vitals WNL. Pt alert and oriented x4. Provider at bedside.

## 2020-07-23 NOTE — ED Notes (Signed)
Patient transported to CT 

## 2020-07-23 NOTE — ED Triage Notes (Signed)
Pt bibems from [facility]. Pt here for a fall. Pt just arrived at facility today.pt just started on warfarin today. Pt was standing up from wheelchair and lost her balance and fell face first. Patient has a small laceration above left eye. Pt is alert and oriented x4. Pt has no complaints at this time. pts vitals 158/70 , hr 62 ,97% Ra and cbg 129.   Pt did have a fall in September that resulted in a bleed and surgery.

## 2020-07-23 NOTE — ED Provider Notes (Signed)
[facility] EMERGENCY DEPARTMENT Provider Note   CSN: [id number] Arrival date & time: 07/23/20  2140     History Chief Complaint  Patient presents with  . Fall    [name] is a 70 y.o. female.  70 yo F with a chief complaints of fall.  Patient tells me that she has been very weak all day.  She has trouble describing in any other way.  She denies any chest pain or pressure denies any shortness of breath denies nausea vomiting or diarrhea denies decreased oral intake denies dark stool or blood in her stool.  The patient was in [place] visiting family and she had an event that required a craniotomy.  Had just returned today and was feeling unwell.  She thinks that she had may be lost her balance and caused her to fall forward and strike her head.  She denies any pain from the fall.  Still feels weak.  The history is provided by the patient and the EMS personnel.  Fall This is a new problem. The current episode started 2 days ago. The problem occurs constantly. The problem has not changed since onset.Pertinent negatives include no chest pain, no headaches and no shortness of breath. Nothing aggravates the symptoms. Nothing relieves the symptoms. She has tried nothing for the symptoms. The treatment provided no relief.       History reviewed. No pertinent past medical history.  There are no problems to display for this patient.   History reviewed. No pertinent surgical history.s   OB History   No obstetric history on file.     No family history on file.     Home Medications Prior to Admission medications   Not on File    Allergies    Patient has no allergy information on record.  Review of Systems   Review of Systems  Constitutional: Negative for chills and fever.  HENT: Negative for congestion and rhinorrhea.   Eyes: Negative for redness and visual disturbance.  Respiratory: Negative for shortness of breath and wheezing.    Cardiovascular: Negative for chest pain and palpitations.  Gastrointestinal: Negative for nausea and vomiting.  Genitourinary: Negative for dysuria and urgency.  Musculoskeletal: Negative for arthralgias and myalgias.  Skin: Negative for pallor and wound.  Neurological: Positive for weakness. Negative for dizziness and headaches.    Physical Exam Updated Vital Signs BP (!) 144/56   Pulse (!) 57   Temp 98.6 F (37 C) (Oral)   Resp 15   Ht 5\' 8"  (1.727 m)   SpO2 100%   Physical Exam Vitals and nursing note reviewed.  Constitutional:      General: She is not in acute distress.    Appearance: She is well-developed and well-nourished. She is not diaphoretic.  HENT:     Head: Normocephalic.     Comments: Laceration to the left upper eyelid.  Approximately 2.7 cm in length.  Mildly gaping.  Left-sided craniotomy.  Skin is healed.  No erythema or drainage. Eyes:     Extraocular Movements: EOM normal.     Pupils: Pupils are equal, round, and reactive to light.  Cardiovascular:     Rate and Rhythm: Normal rate and regular rhythm.     Heart sounds: Murmur heard.  No friction rub. No gallop.      Comments: Systolic ejection murmur with metallic click Pulmonary:     Effort: Pulmonary effort is normal.     Breath sounds: No wheezing or rales.  Abdominal:     General: There is no distension.     Palpations: Abdomen is soft.     Tenderness: There is no abdominal tenderness.  Musculoskeletal:        General: No tenderness or edema.     Cervical back: Normal range of motion and neck supple.  Skin:    General: Skin is warm and dry.  Neurological:     Mental Status: She is alert and oriented to person, place, and time.     Comments: No obvious neurologic deficit on exam.  Psychiatric:        Mood and Affect: Mood and affect normal.        Behavior: Behavior normal.     ED Results / Procedures / Treatments   Labs (all labs ordered are listed, but only abnormal results are  displayed) Labs Reviewed  CBC WITH DIFFERENTIAL/PLATELET - Abnormal; Notable for the following components:      Result Value   RBC 3.62 (*)    Hemoglobin 11.3 (*)    HCT 35.3 (*)    All other components within normal limits  COMPREHENSIVE METABOLIC PANEL - Abnormal; Notable for the following components:   Glucose, Bld 110 (*)    BUN 36 (*)    Creatinine, Ser 1.94 (*)    AST 60 (*)    Alkaline Phosphatase 36 (*)    GFR, Estimated 28 (*)    All other components within normal limits  PROTIME-INR - Abnormal; Notable for the following components:   Prothrombin Time 26.1 (*)    INR 2.5 (*)    All other components within normal limits  URINALYSIS, ROUTINE W REFLEX MICROSCOPIC  TROPONIN I (HIGH SENSITIVITY)    EKG EKG Interpretation  Date/Time:  Monday July 23 2020 21:58:17 EST Ventricular Rate:  62 PR Interval:    QRS Duration: 130 QT Interval:  506 QTC Calculation: 514 R Axis:   15 Text Interpretation: Junctional rhythm Left bundle branch block No old tracing to compare Confirmed by Deno Etienne [phone]) on 07/23/2020 10:20:31 PM   Radiology CT Head Wo Contrast  Result Date: 07/23/2020 CLINICAL DATA:  Head trauma EXAM: CT HEAD WITHOUT CONTRAST TECHNIQUE: Contiguous axial images were obtained from the base of the skull through the vertex without intravenous contrast. COMPARISON:  06/13/2020 FINDINGS: Brain: There is a small amount of acute subarachnoid hemorrhage along the right temporal lobe, reference images 12 and 13. Complex low-attenuation fluid along the right parietal convexity consistent with chronic subdural hematoma. On coronal reconstructed images there are thin areas of increased attenuation along the periphery which could reflect superimposed acute hemorrhage. Sequela from previous left frontotemporal craniotomy, with dural calcifications along the surgical margins. No acute infarct. Lateral ventricles and midline structures are unremarkable. No mass effect. Vascular: No  hyperdense vessel or unexpected calcification. Skull: No acute bony abnormalities. Sinuses/Orbits: No acute finding. Other: None. IMPRESSION: 1. Trace acute subarachnoid hemorrhage along the right temporal lobe. 2. Minimal acute on chronic right parietal convexity subdural hematoma. No mass effect. 3. Postsurgical changes from left frontotemporal craniotomy, with underlying dural calcifications. Critical Value/emergent results were called by telephone at the time of interpretation on 07/23/2020 at 10:25 pm to provider Tashari Schoenfelder , who verbally acknowledged these results. Electronically Signed   By: Randa Ngo M.D.   On: 07/23/2020 22:25   DG Chest Port 1 View  Result Date: 07/23/2020 CLINICAL DATA:  Fall.  Patient was started on warfarin today. EXAM: PORTABLE CHEST 1 VIEW COMPARISON:  04/29/2020  FINDINGS: Patient's LEFT-sided transvenous pacemaker with leads to the RIGHT atrium and RIGHT ventricle. Median sternotomy and valve replacement. Heart size is mildly enlarged, stable. No focal consolidations or pleural effusions. No pulmonary edema. No pneumothorax or acute fracture. Surgical clips overlie the RIGHT axillary region consistent with previous axillary node dissection. IMPRESSION: 1. Stable cardiomegaly. 2. No evidence for acute abnormality. Electronically Signed   By: Nolon Nations M.D.   On: 07/23/2020 22:02    Procedures Procedures   Medications Ordered in ED Medications  sodium chloride 0.9 % bolus 1,000 mL (0 mLs Intravenous Stopped 07/23/20 2331)    ED Course  I have reviewed the triage vital signs and the nursing notes.  Pertinent labs & imaging results that were available during my care of the patient were reviewed by me and considered in my medical decision making (see chart for details).    MDM Rules/Calculators/A&P                          70 yo F with a chief complaints of fall.  Patient is also not been feeling well today.  She is unable to describe it much further.  She  was recently in [place] and had an injury requiring a craniotomy.  CT scan of the head here today with some acute on chronic intracranial hemorrhage.  Will discuss with neurosurgery.  No obvious neurologic deficits for me.  Awaiting a laboratory evaluation for her feeling unwell.  Discussed with Neurosurgery, Dr Annette Stable on call, recommends repeat CT in 6 hours. If unchanged felt ok for d/c from their stand point.    Unfortunately the patient has a Environmental health practitioner heart valve and so I think she needs to remain on anticoagulation.  Patient care was signed out to Dr. Christy Gentles.  Please see his note for further details care.  CRITICAL CARE Performed by: Cecilio Asper   Total critical care time: 35 minutes  Critical care time was exclusive of separately billable procedures and treating other patients.  Critical care was necessary to treat or prevent imminent or life-threatening deterioration.  Critical care was time spent personally by me on the following activities: development of treatment plan with patient and/or surrogate as well as nursing, discussions with consultants, evaluation of patient's response to treatment, examination of patient, obtaining history from patient or surrogate, ordering and performing treatments and interventions, ordering and review of laboratory studies, ordering and review of radiographic studies, pulse oximetry and re-evaluation of patient's condition.  The patients results and plan were reviewed and discussed.   Any x-rays performed were independently reviewed by myself.   Differential diagnosis were considered with the presenting HPI.  Medications  sodium chloride 0.9 % bolus 1,000 mL (0 mLs Intravenous Stopped 07/23/20 2331)    Vitals:   07/23/20 2154 07/23/20 2245 07/23/20 2300 07/23/20 2315  BP:  (!) 161/56 (!) 140/54 (!) 144/56  Pulse:  (!) 59 (!) 59 (!) 57  Resp:  19 16 15   Temp:      TempSrc:      SpO2:  100% 100% 100%  Height: 5\' 8"  (1.727  m)       Final diagnoses:  Traumatic hemorrhage of right cerebrum without loss of consciousness, initial encounter [facility])    Admission/ observation were discussed with the admitting physician, patient and/or family and they are comfortable with the plan.   Final Clinical Impression(s) / ED Diagnoses Final diagnoses:  Traumatic hemorrhage of right cerebrum without  loss of consciousness, initial encounter [facility])    Rx / [place] Orders ED Discharge Orders    None       Deno Etienne, DO 07/23/20 2338

## 2020-07-24 ENCOUNTER — Observation Stay (HOSPITAL_COMMUNITY): Payer: HMO

## 2020-07-24 DIAGNOSIS — I10 Essential (primary) hypertension: Secondary | ICD-10-CM | POA: Diagnosis present

## 2020-07-24 DIAGNOSIS — E039 Hypothyroidism, unspecified: Secondary | ICD-10-CM | POA: Diagnosis present

## 2020-07-24 DIAGNOSIS — Y92009 Unspecified place in unspecified non-institutional (private) residence as the place of occurrence of the external cause: Secondary | ICD-10-CM

## 2020-07-24 DIAGNOSIS — S066XAA Traumatic subarachnoid hemorrhage with loss of consciousness status unknown, initial encounter: Secondary | ICD-10-CM

## 2020-07-24 DIAGNOSIS — S065X9A Traumatic subdural hemorrhage with loss of consciousness of unspecified duration, initial encounter: Secondary | ICD-10-CM | POA: Diagnosis present

## 2020-07-24 DIAGNOSIS — I4891 Unspecified atrial fibrillation: Secondary | ICD-10-CM | POA: Diagnosis not present

## 2020-07-24 DIAGNOSIS — N3000 Acute cystitis without hematuria: Secondary | ICD-10-CM | POA: Diagnosis present

## 2020-07-24 DIAGNOSIS — N179 Acute kidney failure, unspecified: Secondary | ICD-10-CM | POA: Diagnosis not present

## 2020-07-24 DIAGNOSIS — W19XXXA Unspecified fall, initial encounter: Secondary | ICD-10-CM | POA: Diagnosis not present

## 2020-07-24 DIAGNOSIS — K219 Gastro-esophageal reflux disease without esophagitis: Secondary | ICD-10-CM | POA: Diagnosis not present

## 2020-07-24 DIAGNOSIS — F319 Bipolar disorder, unspecified: Secondary | ICD-10-CM | POA: Diagnosis present

## 2020-07-24 DIAGNOSIS — S066X9A Traumatic subarachnoid hemorrhage with loss of consciousness of unspecified duration, initial encounter: Secondary | ICD-10-CM | POA: Diagnosis present

## 2020-07-24 DIAGNOSIS — S065XAA Traumatic subdural hemorrhage with loss of consciousness status unknown, initial encounter: Secondary | ICD-10-CM | POA: Diagnosis present

## 2020-07-24 DIAGNOSIS — N1831 Chronic kidney disease, stage 3a: Secondary | ICD-10-CM | POA: Diagnosis present

## 2020-07-24 DIAGNOSIS — S066X0A Traumatic subarachnoid hemorrhage without loss of consciousness, initial encounter: Secondary | ICD-10-CM | POA: Diagnosis not present

## 2020-07-24 DIAGNOSIS — I4892 Unspecified atrial flutter: Secondary | ICD-10-CM

## 2020-07-24 DIAGNOSIS — Z952 Presence of prosthetic heart valve: Secondary | ICD-10-CM | POA: Diagnosis not present

## 2020-07-24 DIAGNOSIS — S065X0A Traumatic subdural hemorrhage without loss of consciousness, initial encounter: Secondary | ICD-10-CM | POA: Diagnosis not present

## 2020-07-24 DIAGNOSIS — I951 Orthostatic hypotension: Secondary | ICD-10-CM

## 2020-07-24 DIAGNOSIS — I5043 Acute on chronic combined systolic (congestive) and diastolic (congestive) heart failure: Secondary | ICD-10-CM

## 2020-07-24 DIAGNOSIS — F3130 Bipolar disorder, current episode depressed, mild or moderate severity, unspecified: Secondary | ICD-10-CM

## 2020-07-24 DIAGNOSIS — N1832 Chronic kidney disease, stage 3b: Secondary | ICD-10-CM | POA: Diagnosis present

## 2020-07-24 DIAGNOSIS — G939 Disorder of brain, unspecified: Secondary | ICD-10-CM | POA: Diagnosis not present

## 2020-07-24 HISTORY — DX: Traumatic subarachnoid hemorrhage with loss of consciousness status unknown, initial encounter: S06.6XAA

## 2020-07-24 HISTORY — DX: Acute cystitis without hematuria: N30.00

## 2020-07-24 HISTORY — DX: Unspecified fall, initial encounter: W19.XXXA

## 2020-07-24 HISTORY — DX: Unspecified fall, initial encounter: Y92.009

## 2020-07-24 HISTORY — DX: Acute on chronic combined systolic (congestive) and diastolic (congestive) heart failure: I50.43

## 2020-07-24 LAB — COMPREHENSIVE METABOLIC PANEL
ALT: 32 U/L (ref 0–44)
AST: 42 U/L — ABNORMAL HIGH (ref 15–41)
Albumin: 3.3 g/dL — ABNORMAL LOW (ref 3.5–5.0)
Alkaline Phosphatase: 32 U/L — ABNORMAL LOW (ref 38–126)
Anion gap: 9 (ref 5–15)
BUN: 29 mg/dL — ABNORMAL HIGH (ref 8–23)
CO2: 27 mmol/L (ref 22–32)
Calcium: 9.1 mg/dL (ref 8.9–10.3)
Chloride: 103 mmol/L (ref 98–111)
Creatinine, Ser: 1.51 mg/dL — ABNORMAL HIGH (ref 0.44–1.00)
GFR, Estimated: 37 mL/min — ABNORMAL LOW (ref >60)
Glucose, Bld: 102 mg/dL — ABNORMAL HIGH (ref 70–99)
Potassium: 3.6 mmol/L (ref 3.5–5.1)
Sodium: 139 mmol/L (ref 135–145)
Total Bilirubin: 0.8 mg/dL (ref 0.3–1.2)
Total Protein: 6.1 g/dL — ABNORMAL LOW (ref 6.5–8.1)

## 2020-07-24 LAB — URINALYSIS, ROUTINE W REFLEX MICROSCOPIC
Bilirubin Urine: NEGATIVE
Glucose, UA: NEGATIVE mg/dL
Ketones, ur: NEGATIVE mg/dL
Nitrite: NEGATIVE
Protein, ur: NEGATIVE mg/dL
Specific Gravity, Urine: 1.008 (ref 1.005–1.030)
pH: 5 (ref 5.0–8.0)

## 2020-07-24 LAB — CBC
HCT: 28.6 % — ABNORMAL LOW (ref 36.0–46.0)
Hemoglobin: 9.5 g/dL — ABNORMAL LOW (ref 12.0–15.0)
MCH: 31.7 pg (ref 26.0–34.0)
MCHC: 33.2 g/dL (ref 30.0–36.0)
MCV: 95.3 fL (ref 80.0–100.0)
Platelets: 207 10*3/uL (ref 150–400)
RBC: 3 MIL/uL — ABNORMAL LOW (ref 3.87–5.11)
RDW: 14.5 % (ref 11.5–15.5)
WBC: 6.8 10*3/uL (ref 4.0–10.5)
nRBC: 0 % (ref 0.0–0.2)

## 2020-07-24 LAB — LIPID PANEL
Cholesterol: 147 mg/dL (ref 0–200)
HDL: 62 mg/dL (ref >40)
LDL Cholesterol: 60 mg/dL (ref 0–99)
Total CHOL/HDL Ratio: 2.4 RATIO
Triglycerides: 124 mg/dL (ref <150)
VLDL: 25 mg/dL (ref 0–40)

## 2020-07-24 LAB — GLUCOSE, CAPILLARY
Glucose-Capillary: 92 mg/dL (ref 70–99)
Glucose-Capillary: 174 mg/dL — ABNORMAL HIGH (ref 70–99)
Glucose-Capillary: 146 mg/dL — ABNORMAL HIGH (ref 70–99)
Glucose-Capillary: 110 mg/dL — ABNORMAL HIGH (ref 70–99)

## 2020-07-24 LAB — PROTIME-INR
INR: 2.8 — ABNORMAL HIGH (ref 0.8–1.2)
Prothrombin Time: 28.7 seconds — ABNORMAL HIGH (ref 11.4–15.2)

## 2020-07-24 LAB — SARS CORONAVIRUS 2 (TAT 6-24 HRS): SARS Coronavirus 2: NEGATIVE

## 2020-07-24 LAB — HIV ANTIBODY (ROUTINE TESTING W REFLEX): HIV Screen 4th Generation wRfx: NONREACTIVE

## 2020-07-24 LAB — HEMOGLOBIN A1C
Hgb A1c MFr Bld: 4.8 % (ref 4.8–5.6)
Mean Plasma Glucose: 91.06 mg/dL

## 2020-07-24 LAB — APTT: aPTT: 43 seconds — ABNORMAL HIGH (ref 24–36)

## 2020-07-24 MED ORDER — ROSUVASTATIN CALCIUM 20 MG PO TABS
40.0000 mg | ORAL_TABLET | Freq: Every day | ORAL | Status: DC
  Administered 2020-07-24 – 2020-07-25 (×2): 40 mg via ORAL
  Filled 2020-07-24 (×2): qty 2

## 2020-07-24 MED ORDER — WARFARIN SODIUM 5 MG PO TABS
5.0000 mg | ORAL_TABLET | Freq: Once | ORAL | Status: AC
  Administered 2020-07-24: 5 mg via ORAL
  Filled 2020-07-24: qty 1

## 2020-07-24 MED ORDER — METOPROLOL TARTRATE 25 MG PO TABS
25.0000 mg | ORAL_TABLET | Freq: Two times a day (BID) | ORAL | Status: DC
  Administered 2020-07-24: 25 mg via ORAL
  Filled 2020-07-24: qty 1

## 2020-07-24 MED ORDER — METOPROLOL TARTRATE 25 MG PO TABS
25.0000 mg | ORAL_TABLET | Freq: Three times a day (TID) | ORAL | Status: DC
  Administered 2020-07-24: 25 mg via ORAL
  Filled 2020-07-24: qty 1

## 2020-07-24 MED ORDER — PANTOPRAZOLE SODIUM 40 MG PO TBEC
40.0000 mg | DELAYED_RELEASE_TABLET | Freq: Every day | ORAL | Status: DC
  Administered 2020-07-24 – 2020-07-25 (×2): 40 mg via ORAL
  Filled 2020-07-24 (×2): qty 1

## 2020-07-24 MED ORDER — OXCARBAZEPINE 150 MG PO TABS: 150.0000 mg | ORAL_TABLET | Freq: Two times a day (BID) | ORAL | Status: DC

## 2020-07-24 MED ORDER — AMLODIPINE BESYLATE 5 MG PO TABS: 5.0000 mg | ORAL_TABLET | Freq: Every day | ORAL | Status: DC

## 2020-07-24 MED ORDER — INSULIN ASPART 100 UNIT/ML ~~LOC~~ SOLN: 0.0000 [IU] | Freq: Three times a day (TID) | SUBCUTANEOUS | Status: DC

## 2020-07-24 MED ORDER — LACTATED RINGERS IV SOLN: INTRAVENOUS | Status: DC

## 2020-07-24 MED ORDER — AMLODIPINE BESYLATE 10 MG PO TABS
10.0000 mg | ORAL_TABLET | Freq: Every day | ORAL | Status: DC
  Administered 2020-07-24: 10 mg via ORAL
  Filled 2020-07-24: qty 1

## 2020-07-24 MED ORDER — WARFARIN SODIUM 3 MG PO TABS
3.0000 mg | ORAL_TABLET | Freq: Every day | ORAL | Status: DC
  Filled 2020-07-24: qty 1

## 2020-07-24 MED ORDER — CEFDINIR 300 MG PO CAPS
300.0000 mg | ORAL_CAPSULE | Freq: Two times a day (BID) | ORAL | Status: DC
  Administered 2020-07-24 – 2020-07-25 (×3): 300 mg via ORAL
  Filled 2020-07-24 (×4): qty 1

## 2020-07-24 MED ORDER — DOXYCYCLINE HYCLATE 100 MG PO TBEC: 100.0000 mg | DELAYED_RELEASE_TABLET | Freq: Two times a day (BID) | ORAL | Status: DC

## 2020-07-24 MED ORDER — DOCUSATE SODIUM 100 MG PO CAPS
100.0000 mg | ORAL_CAPSULE | Freq: Two times a day (BID) | ORAL | Status: DC
  Administered 2020-07-24 – 2020-07-25 (×3): 100 mg via ORAL
  Filled 2020-07-24 (×3): qty 1

## 2020-07-24 MED ORDER — WARFARIN - PHYSICIAN DOSING INPATIENT: Freq: Every day | Status: DC

## 2020-07-24 MED ORDER — LEVETIRACETAM 500 MG PO TABS
500.0000 mg | ORAL_TABLET | Freq: Two times a day (BID) | ORAL | Status: DC
  Administered 2020-07-24 – 2020-07-25 (×3): 500 mg via ORAL
  Filled 2020-07-24 (×3): qty 1

## 2020-07-24 MED ORDER — AMIODARONE HCL 200 MG PO TABS: 200.0000 mg | ORAL_TABLET | Freq: Every day | ORAL | Status: DC

## 2020-07-24 MED ORDER — LEVOTHYROXINE SODIUM 25 MCG PO TABS
25.0000 ug | ORAL_TABLET | Freq: Every day | ORAL | Status: DC
  Administered 2020-07-24 – 2020-07-25 (×2): 25 ug via ORAL
  Filled 2020-07-24 (×2): qty 1

## 2020-07-24 MED ORDER — CEFDINIR 300 MG PO CAPS: 300.0000 mg | ORAL_CAPSULE | Freq: Two times a day (BID) | ORAL | Status: DC

## 2020-07-24 MED ORDER — STROKE: EARLY STAGES OF RECOVERY BOOK
Freq: Once | Status: AC
  Administered 2020-07-24: 1
  Filled 2020-07-24: qty 1

## 2020-07-24 MED ORDER — SODIUM CHLORIDE 0.9 % IV BOLUS
1000.0000 mL | Freq: Once | INTRAVENOUS | Status: AC
  Administered 2020-07-24: 1000 mL via INTRAVENOUS

## 2020-07-24 MED ORDER — POLYVINYL ALCOHOL 1.4 % OP SOLN: 1.0000 [drp] | OPHTHALMIC | Status: DC | PRN

## 2020-07-24 MED ORDER — SENNA 8.6 MG PO TABS
2.0000 | ORAL_TABLET | Freq: Two times a day (BID) | ORAL | Status: DC
  Administered 2020-07-24 – 2020-07-25 (×3): 17.2 mg via ORAL
  Filled 2020-07-24 (×3): qty 2

## 2020-07-24 MED ORDER — AMIODARONE HCL 200 MG PO TABS
200.0000 mg | ORAL_TABLET | Freq: Two times a day (BID) | ORAL | Status: DC
  Administered 2020-07-24 – 2020-07-25 (×3): 200 mg via ORAL
  Filled 2020-07-24 (×3): qty 1

## 2020-07-24 MED ORDER — LAMOTRIGINE 100 MG PO TABS: 200.0000 mg | ORAL_TABLET | Freq: Every day | ORAL | Status: DC

## 2020-07-24 MED ORDER — FUROSEMIDE 40 MG PO TABS: 40.0000 mg | ORAL_TABLET | Freq: Every day | ORAL | Status: DC

## 2020-07-24 MED ORDER — ACETAMINOPHEN 500 MG PO TABS
1000.0000 mg | ORAL_TABLET | Freq: Two times a day (BID) | ORAL | Status: DC
  Administered 2020-07-24 – 2020-07-25 (×3): 1000 mg via ORAL
  Filled 2020-07-24 (×3): qty 2

## 2020-07-24 MED ORDER — LAMOTRIGINE 100 MG PO TABS
150.0000 mg | ORAL_TABLET | Freq: Two times a day (BID) | ORAL | Status: DC
  Administered 2020-07-24 – 2020-07-25 (×3): 150 mg via ORAL
  Filled 2020-07-24 (×3): qty 2

## 2020-07-24 MED ORDER — LEVETIRACETAM 500 MG PO TABS
500.0000 mg | ORAL_TABLET | Freq: Two times a day (BID) | ORAL | Status: DC
Start: 2020-07-24 — End: 2020-07-24

## 2020-07-24 MED ORDER — FENOFIBRATE 160 MG PO TABS
160.0000 mg | ORAL_TABLET | Freq: Every day | ORAL | Status: DC
  Administered 2020-07-24 – 2020-07-25 (×2): 160 mg via ORAL
  Filled 2020-07-24 (×2): qty 1

## 2020-07-24 MED ORDER — CIPROFLOXACIN HCL 500 MG PO TABS: 250.0000 mg | ORAL_TABLET | Freq: Two times a day (BID) | ORAL | Status: DC

## 2020-07-24 MED ORDER — LAMOTRIGINE 100 MG PO TABS: 100.0000 mg | ORAL_TABLET | Freq: Every day | ORAL | Status: DC

## 2020-07-24 MED ORDER — SUMATRIPTAN SUCCINATE 50 MG PO TABS: 50.0000 mg | ORAL_TABLET | ORAL | Status: DC | PRN

## 2020-07-24 MED ORDER — WARFARIN - PHARMACIST DOSING INPATIENT: Freq: Every day | Status: DC

## 2020-07-24 MED ORDER — HYDRALAZINE HCL 10 MG PO TABS: 10.0000 mg | ORAL_TABLET | Freq: Three times a day (TID) | ORAL | Status: DC | PRN

## 2020-07-24 MED ORDER — SACCHAROMYCES BOULARDII 250 MG PO CAPS
250.0000 mg | ORAL_CAPSULE | Freq: Every day | ORAL | Status: DC
  Administered 2020-07-24 – 2020-07-25 (×2): 250 mg via ORAL
  Filled 2020-07-24 (×2): qty 1

## 2020-07-24 NOTE — Progress Notes (Signed)
ANTICOAGULATION CONSULT NOTE - Initial Consult  Pharmacy Consult:  Coumadin Indication:   History of mechanical AVR and Afib  Not on File  Patient Measurements: Height: 5\' 8"  (172.7 cm) Weight: 86.6 kg (191 lb) IBW/kg (Calculated) : 63.9  Vital Signs: Temp: 98.2 F (36.8 C) (03/01 0900) Temp Source: Oral (03/01 0405) BP: 118/68 (03/01 0614) Pulse Rate: 68 (03/01 0614)  Labs: Recent Labs    07/23/20 2152 07/23/20 2215 07/24/20 0358  HGB 11.3*  --  9.5*  HCT 35.3*  --  28.6*  PLT 243  --  207  APTT  --   --  43*  LABPROT  --  26.1* 28.7*  INR  --  2.5* 2.8*  CREATININE 1.94*  --  1.51*  TROPONINIHS 16  --   --     Estimated Creatinine Clearance: 40.5 mL/min (A) (by C-G formula based on SCr of 1.51 mg/dL (H)).   Assessment: 52 YOF presented s/p fall.  Patient has a history of mechanical AVR and Afib on Coumadin 5mg  PO daily except 2.5mg  on Mon/Fri PTA per patient.  She also has a recent left subdural hematoma requiring crani with evacuation.  CT here showed stable subdural hematoma with improving SAH that nearly resolved.  Pharmacy consulted to continue Coumadin from PTA.  INR therapeutic; no bleeding reported although hemoglobin/hematocrit are trending down.  Goal of Therapy:  INR 2.5 - 3.5 per patient  Monitor platelets by anticoagulation protocol: Yes   Plan:  Coumadin 5mg  PO today Daily PT / INR  Thuy D. Mina Marble, PharmD, BCPS, [place] 07/24/2020, 2:13 PM

## 2020-07-24 NOTE — Progress Notes (Signed)
Patient arrived on the unit from ED. Patient alert and oriented x4. Laceration noted on left above/lower eye. Neuro intact with this assessment. MD at bedside, Will continue monitoring.

## 2020-07-24 NOTE — Progress Notes (Addendum)
PROGRESS NOTE    [name]  KGY:[id number] DOB: [birth year] DOA: 07/23/2020 PCP: Mosie Lukes, MD    Brief Narrative:  [name] is a 70 year old female with past medical history significant for atrial fibrillation/flutter, non-insulin-dependent diabetes mellitus, migraine headaches, essential hypertension, hyperlipidemia, gout, bipolar disorder, history of mechanical aortic valve replacement 2009 on chronic anticoagulation with Coumadin and recent large left subdural hematoma requiring craniotomy and evacuation who presented [facility] ED from SNF following fall.  Patient states that she was sitting in her wheelchair and attempted to stand up in which she lost her balance and fell.  Patient denies loss of consciousness lightheadedness or vertigo that precipitated the fall.  No seizure-like activity, loss of bowel/bladder function, or tongue biting was appreciated by staff.  Patient struck the left side of her head on the ground and EMS was activated.  Patient recently hospitalized at [facility] in [place] from 2/3 until 2/28 status post fall while visiting her son.  During that hospitalization she was found to have a large left subdural hematoma, subarachnoid hemorrhage and some events of cerebral edema requiring emergent intubation, emergent reversal of her anticoagulant and emergent neurosurgical intervention with craniotomy and evacuation of the bleed.  Patient was in the intensive care unit for the majority of her hospital stay and eventually discharged to [facility] on 07/23/2020.  In the ED, temperature 98.6 F, HR 60, RR 161/65, SPO2 97% on room air.  Sodium 136, potassium 4.2, chloride 99, CO2 25, glucose 110, BUN 36, creatinine 1.94, AST 60, ALT 39, total bilirubin 0.9.  WBC 7.0, hemoglobin 11.3, platelets 243.  SARS-CoV-2/Covid-19 negative. Chest x-ray with stable cardiomegaly, no evidence of acute cardiopulmonary disease process.  CT head  without contrast with trace acute subarachnoid hemorrhage along the right temporal lobe, minimal acute on chronic right parietal, convexity subdural hematoma without mass-effect and postsurgical changes from left frontotemporal craniotomy with underlying dural calcifications.  Case was discussed by EDP with neurosurgery, Dr. Annette Stable who recommended admission for observation and serial noncontrast CT of the head in 6 hours.  Hospitalist service consulted for further evaluation and management.   Assessment & Plan:   Principal Problem:   Subarachnoid hematoma (HCC) Active Problems:   Acute cystitis without hematuria   Fall at home, initial encounter   Acute on chronic combined systolic and diastolic CHF (congestive heart failure) ([place])   Essential hypertension   GERD without esophagitis   Atrial fibrillation and flutter (HCC)   Bipolar disorder (HCC)   Subdural hematoma (HCC)   Hypothyroidism   Acute renal failure superimposed on stage 3a chronic kidney disease ([place])   History of mechanical aortic valve replacement   Subarachnoid hematoma Subdural hematoma Patient presenting to the ED from SNF following fall and striking the left side of her head.  CT head on arrival to the ED revealing trace acute subarachnoid hemorrhage of the right temporal lobe in addition to minimal acute on chronic right parietal subdural hematoma.  Recent prolonged hospitalization in [facility] following fall with subsequent SDH/SAH.  Complicated by history of chronic anticoagulant use with Coumadin due to her mechanical aortic valve.   --F/U CT Head this a.m. with stable right subdural hematoma with mild mass-effect upon the right cerebral hemisphere and improving right trace subarachnoid hemorrhage now nearly resolved. --Seen by neurosurgery this a.m., no further imaging required unless a decline in patient's mental status and okay for follow-up outpatient with Dr. Annette Stable --Continue Lamictal 150  mg p.o. twice  daily --Continue Keppra 5 mg p.o. twice daily  Orthostatic hypotension While working with OT this morning, notable drop in blood pressure in seated position 114/54 to 62/50 in the standing position with associated dizziness.  Question orthostasis causing falls leading to SAH/SDH as above; with fall once again while transitioning from wheelchair at SNF yesterday.  Patient is currently on metoprolol tartrate 25 mg PO q8h and amlodipine 10 mg p.o. daily. --NS 1L bolus --Reduce metoprolol tartrate 25 mg p.o. q12h --Decrease amlodipine to 5 mg p.o. daily --Repeat orthostatic vital signs following IV fluid bolus --Continue to monitor BP closely  Essential hypertension. Patient currently on amlodipine 10 mg p.o. daily, metoprolol tartrate 25 mg p.o. every 8 hours, and furosemide 40 mg p.o. daily.  Patient with significant orthostatic hypotension as above.  --Reduce amlodipine to 5 mg p.o. daily --Reduce metoprolol tartrate to 25 mg p.o. every 12 hours --Hydralazine 10 mg p.o. every 8 hours prn SBP >140 or DBP >110 --Continue closely monitor BP  Acute renal failure on CKD stage IIIa Baseline creatinine 1.2-1.4.  Presenting with creatinine of 1.94. --Cr 1.94>1.51 --Holding home furosemide --Continue IV fluid hydration/bolus --Repeat BMP in a.m.  Paroxysmal atrial fibrillation/flutter On Coumadin for anticoagulation, rate control with metoprolol 25 mg p.o. 3 times daily. --Decrease metoprolol tartrate to 25 mg p.o. twice daily as above --Continue Coumadin, pharmacy consulted for dosing/monitoring  Mechanical aortic valve INR 2.8 this morning.  Continue Coumadin, pharmacy consulted for dosing/monitoring.  Type 2 diabetes mellitus Managed with insulin sliding scale outpatient. --SSI for coverage --CBGs qAC/HS  Urinary tract infection Patient was being treated with ciprofloxacin outpatient.  Urinalysis with large leukoesterase, negative nitrite, few bacteria and 21-50 WBCs.  Given her  SAH/SDH on Keppra, pharmacy recommended transitioning to cephalosporin. --Continue cefdinir 300 mg p.o. twice daily to complete 3-day course  Hyperlipidemia: Crestor 40 mg p.o. daily  Bipolar disorder --Continue Lamictal 150 mg p.o. BID  Hypothyroidism: Levothyroxine 25 mcg p.o. daily  History of migraine headaches --Continue sumatriptan 50 mg every 2 hours as needed  GERD: Continue PPI   DVT prophylaxis: Coumadin   Code Status: DNR Family Communication: Updated patient extensively at bedside  Disposition Plan:  Level of care: Progressive Status is: Observation  The patient remains OBS appropriate and will d/c before 2 midnights.  Dispo: The patient is from: SNF              Anticipated d/c is to: SNF              Patient currently is not medically stable to d/c.   Difficult to place patient No   Consultants:   Neurosurgery, Dr. Annette Stable  Procedures:   None  Antimicrobials:   None   Subjective: Patient seen and examined at bedside, resting comfortably in bedside chair.  Just finished working with OT this morning.  Patient with significant orthostatic hypotension from seated to standing position.  Also associated with some dizziness/lightheadedness.  Seen by neurosurgery this morning, no further imaging required unless changes in mental status; with plan outpatient follow-up.  No other questions or concerns at this time.  Patient denies headache, no visual changes, no chest pain, no palpitations, no shortness of breath, no abdominal pain, no paresthesias.  No acute events overnight per nursing staff.  Objective: Vitals:   07/24/20 0130 07/24/20 0234 07/24/20 0405 07/24/20 0614  BP: 135/74 (!) 122/56 (!) 117/56 118/68  Pulse: 61 60 60 68  Resp: 14 17 19    Temp:  97.8 F (36.6 C) 97.7 F (36.5 C)   TempSrc:  Oral Oral   SpO2: 99% 100% 99%   Weight:      Height:        Intake/Output Summary (Last 24 hours) at 07/24/2020 1308 Last data filed at 07/24/2020  3474 Gross per 24 hour  Intake 100 ml  Output --  Net 100 ml   Filed Weights   07/23/20 2154  Weight: 86.6 kg    Examination:  General exam: Appears calm and comfortable HEENT: Noted left periorbital ecchymosis noted previous surgical incisions/craniotomy site to the left,  Respiratory system: Clear to auscultation. Respiratory effort normal. Cardiovascular system: S1 & S2 heard, RRR. No JVD, murmurs, rubs, gallops or clicks. No pedal edema. Gastrointestinal system: Abdomen is nondistended, soft and nontender. No organomegaly or masses felt. Normal bowel sounds heard. Central nervous system: Alert and oriented. No focal neurological deficits. Extremities: Symmetric 5 x 5 power. Skin: No rashes, lesions or ulcers Psychiatry: Judgement and insight appear normal. Mood & affect appropriate.     Data Reviewed: I have personally reviewed following labs and imaging studies  CBC: Recent Labs  Lab 07/23/20 2152 07/24/20 0358  WBC 7.0 6.8  NEUTROABS 4.3  --   HGB 11.3* 9.5*  HCT 35.3* 28.6*  MCV 97.5 95.3  PLT 243 259   Basic Metabolic Panel: Recent Labs  Lab 07/23/20 2152 07/24/20 0358  NA 136 139  K 4.2 3.6  CL 99 103  CO2 25 27  GLUCOSE 110* 102*  BUN 36* 29*  CREATININE 1.94* 1.51*  CALCIUM 9.7 9.1   GFR: Estimated Creatinine Clearance: 40.5 mL/min (A) (by C-G formula based on SCr of 1.51 mg/dL (H)). Liver Function Tests: Recent Labs  Lab 07/23/20 2152 07/24/20 0358  AST 60* 42*  ALT 39 32  ALKPHOS 36* 32*  BILITOT 0.9 0.8  PROT 7.8 6.1*  ALBUMIN 4.1 3.3*   No results for input(s): LIPASE, AMYLASE in the last 168 hours. No results for input(s): AMMONIA in the last 168 hours. Coagulation Profile: Recent Labs  Lab 07/23/20 2215 07/24/20 0358  INR 2.5* 2.8*   Cardiac Enzymes: No results for input(s): CKTOTAL, CKMB, CKMBINDEX, TROPONINI in the last 168 hours. BNP (last 3 results) No results for input(s): PROBNP in the last 8760  hours. HbA1C: Recent Labs    07/24/20 0358  HGBA1C 4.8   CBG: Recent Labs  Lab 07/24/20 0429 07/24/20 0850  GLUCAP 92 174*   Lipid Profile: Recent Labs    07/24/20 0358  CHOL 147  HDL 62  LDLCALC 60  TRIG 124  CHOLHDL 2.4   Thyroid Function Tests: No results for input(s): TSH, T4TOTAL, FREET4, T3FREE, THYROIDAB in the last 72 hours. Anemia Panel: No results for input(s): VITAMINB12, FOLATE, FERRITIN, TIBC, IRON, RETICCTPCT in the last 72 hours. Sepsis Labs: No results for input(s): PROCALCITON, LATICACIDVEN in the last 168 hours.  Recent Results (from the past 240 hour(s))  SARS CORONAVIRUS 2 (TAT 6-24 HRS) Nasopharyngeal Nasopharyngeal Swab     Status: None   Collection Time: 07/24/20 12:30 AM   Specimen: Nasopharyngeal Swab  Result Value Ref Range Status   SARS Coronavirus 2 NEGATIVE NEGATIVE Final    Comment: (NOTE) SARS-CoV-2 target nucleic acids are NOT DETECTED.  The SARS-CoV-2 RNA is generally detectable in upper and lower respiratory specimens during the acute phase of infection. Negative results do not preclude SARS-CoV-2 infection, do not rule out co-infections with other pathogens, and should not be used as the  sole basis for treatment or other patient management decisions. Negative results must be combined with clinical observations, patient history, and epidemiological information. The expected result is Negative.  Fact Sheet for Patients: SugarRoll.be  Fact Sheet for Healthcare Providers: https://www.woods-mathews.com/  This test is not yet approved or cleared by the [place] FDA and  has been authorized for detection and/or diagnosis of SARS-CoV-2 by FDA under an Emergency Use Authorization (EUA). This EUA will remain  in effect (meaning this test can be used) for the duration of the COVID-19 declaration under Se ction 564(b)(1) of the Act, 21 U.S.C. section 360bbb-3(b)(1), unless the authorization  is terminated or revoked sooner.  Performed at [facility], [place] [address], [place], [place] [address]          Radiology Studies: CT HEAD WO CONTRAST  Result Date: 07/24/2020 CLINICAL DATA:  Subarachnoid hemorrhage EXAM: CT HEAD WITHOUT CONTRAST TECHNIQUE: Contiguous axial images were obtained from the base of the skull through the vertex without intravenous contrast. COMPARISON:  07/23/2020 FINDINGS: Brain: Left frontotemporal craniotomy has been performed. Stable small left subdural fluid and punctate foci of gas subjacent to the craniotomy flap. No significant associated mass effect. Stable small right subdural hematoma measuring 11-12 mm thick. Minimal mass effect upon the right cerebral hemisphere. No midline shift. Trace subarachnoid hemorrhage superficial to the anterior pole of the right temporal lobe has improved in the interval since prior examination, nearly resolved. No interval hemorrhage. Ventricular size is normal. Cerebellum is unremarkable. Vascular: No asymmetric hyperdense vasculature at the skull base Skull: Otherwise intact Sinuses/Orbits: The visualized paranasal sinuses are clear. The orbits are unremarkable. Other: None IMPRESSION: Stable right subdural hematoma with mild mass effect upon the right cerebral hemisphere. Improving right trace subarachnoid hemorrhage, now nearly resolved. Surgical changes of left frontotemporal craniotomy are again identified with stable fluid and punctate foci of gas subjacent to the craniotomy flap. Electronically Signed   By: Fidela Salisbury MD   On: 07/24/2020 04:57   CT Head Wo Contrast  Result Date: 07/23/2020 CLINICAL DATA:  Head trauma EXAM: CT HEAD WITHOUT CONTRAST TECHNIQUE: Contiguous axial images were obtained from the base of the skull through the vertex without intravenous contrast. COMPARISON:  06/13/2020 FINDINGS: Brain: There is a small amount of acute subarachnoid hemorrhage along the right temporal lobe, reference  images 12 and 13. Complex low-attenuation fluid along the right parietal convexity consistent with chronic subdural hematoma. On coronal reconstructed images there are thin areas of increased attenuation along the periphery which could reflect superimposed acute hemorrhage. Sequela from previous left frontotemporal craniotomy, with dural calcifications along the surgical margins. No acute infarct. Lateral ventricles and midline structures are unremarkable. No mass effect. Vascular: No hyperdense vessel or unexpected calcification. Skull: No acute bony abnormalities. Sinuses/Orbits: No acute finding. Other: None. IMPRESSION: 1. Trace acute subarachnoid hemorrhage along the right temporal lobe. 2. Minimal acute on chronic right parietal convexity subdural hematoma. No mass effect. 3. Postsurgical changes from left frontotemporal craniotomy, with underlying dural calcifications. Critical Value/emergent results were called by telephone at the time of interpretation on 07/23/2020 at 10:25 pm to provider DAN FLOYD , who verbally acknowledged these results. Electronically Signed   By: Randa Ngo M.D.   On: 07/23/2020 22:25   DG Chest Port 1 View  Result Date: 07/23/2020 CLINICAL DATA:  Fall.  Patient was started on warfarin today. EXAM: PORTABLE CHEST 1 VIEW COMPARISON:  04/29/2020 FINDINGS: Patient's LEFT-sided transvenous pacemaker with leads to the RIGHT atrium and RIGHT  ventricle. Median sternotomy and valve replacement. Heart size is mildly enlarged, stable. No focal consolidations or pleural effusions. No pulmonary edema. No pneumothorax or acute fracture. Surgical clips overlie the RIGHT axillary region consistent with previous axillary node dissection. IMPRESSION: 1. Stable cardiomegaly. 2. No evidence for acute abnormality. Electronically Signed   By: Nolon Nations M.D.   On: 07/23/2020 22:02        Scheduled Meds: . acetaminophen  1,000 mg Oral BID  . amiodarone  200 mg Oral BID  . amLODipine   10 mg Oral Daily  . cefdinir  300 mg Oral BID  . docusate sodium  100 mg Oral BID  . fenofibrate  160 mg Oral Daily  . insulin aspart  0-15 Units Subcutaneous TID AC & HS  . lamoTRIgine  150 mg Oral BID  . levETIRAcetam  500 mg Oral BID  . levothyroxine  25 mcg Oral Q0600  . metoprolol tartrate  25 mg Oral Q8H  . pantoprazole  40 mg Oral Daily  . rosuvastatin  40 mg Oral Daily  . saccharomyces boulardii  250 mg Oral Daily  . senna  2 tablet Oral BID  . warfarin  3 mg Oral q1600  . Warfarin - Physician Dosing Inpatient   Does not apply q1600   Continuous Infusions:   LOS: 0 days    Time spent: 39 minutes spent on chart review, discussion with nursing staff, consultants, updating family and interview/physical exam; more than 50% of that time was spent in counseling and/or coordination of care.    Lou Loewe J [place] DO [facility] Available via Epic secure chat 7am-7pm After these hours, please refer to coverage provider listed on [email or web address] 07/24/2020, 1:08 PM

## 2020-07-24 NOTE — Evaluation (Signed)
Physical Therapy Evaluation Patient Details Name: [name] MRN: [id number] DOB: 04-18-1951 Today's Date: 07/24/2020   History of Present Illness  70 year old female admitted from [facility] for fall hitting L side of head. Pt was in [facility] from 2/3-2/8 with recent large left subdural hematoma requiring craniotomy and evacuation due to fall at sons house. PMH:  mechanical aortic valve replacement (2009, on coumadin), atrial fibrillation/flutter (S/P pacemaker, S/P DC cardioversion 04/2020), non-insulin-dependent diabetes mellitus type 2, migraine headaches, hypertension, hyperlipidemia, gout and bipolar disorder    Clinical Impression  Pt admitted with above. Pt alert and oriented but demos decreased insight to safety and deficits. Suspect some vision deficits however pt states her vision is fine. Spoke with OT who will do further assessment. Pt requiring minA for transfers and ambulation with RW. Pt remains appropriate for SNF upon d/c to achieve safe supervision level of function for safe transition home with daughter. Acute PT to cont to follow.    Follow Up Recommendations SNF;Supervision/Assistance - 24 hour    Equipment Recommendations   (TBD at next venue)    Recommendations for Other Services       Precautions / Restrictions Precautions Precautions: Fall Restrictions Weight Bearing Restrictions: No      Mobility  Bed Mobility Overal bed mobility: Needs Assistance Bed Mobility: Supine to Sit     Supine to sit: Min guard     General bed mobility comments: increased time, verbal cues to complete task, HOB elevated, use of bed rail    Transfers Overall transfer level: Needs assistance Equipment used: Rolling walker (2 wheeled) Transfers: Sit to/from Stand Sit to Stand: Min assist         General transfer comment: minA to power up from bed and to steady during transition of hands from bed to RW, verbal cues for safe hand  placement  Ambulation/Gait Ambulation/Gait assistance: Min assist Gait Distance (Feet): 100 Feet Assistive device: Rolling walker (2 wheeled) Gait Pattern/deviations: Step-through pattern;Decreased stride length;Shuffle Gait velocity: dec Gait velocity interpretation: <1.31 ft/sec, indicative of household ambulator General Gait Details: pt with minimal foot clearance, short step length, pt stating "I dont know why my legs wont cooperate". Pt vearing to the R, minA for walker management, modA for walker management during turns as pt with difficulty staying in the walker  Stairs            Wheelchair Mobility    Modified Rankin (Stroke Patients Only)       Balance Overall balance assessment: Needs assistance Sitting-balance support: Feet supported;No upper extremity supported Sitting balance-Leahy Scale: Good Sitting balance - Comments: pt able to take pills from cup in R hand and hold drink in L hand without LOB   Standing balance support: During functional activity Standing balance-Leahy Scale: Poor Standing balance comment: dependent on RW at this time                             Pertinent Vitals/Pain Pain Assessment: No/denies pain    Home Living Family/patient expects to be discharged to:: Skilled nursing facility                 Additional Comments: typically lives with daughter and her family in 2 story home, has a full flight of stairs to access bedroom    Prior Function Level of Independence: Needs assistance   Gait / Transfers Assistance Needed: pt was amb with RW prior to fall at  sons  ADL's / Homemaking Assistance Needed: some assist for IADLs        Hand Dominance   Dominant Hand: Right    Extremity/Trunk Assessment   Upper Extremity Assessment Upper Extremity Assessment: Overall WFL for tasks assessed    Lower Extremity Assessment Lower Extremity Assessment: Generalized weakness (MMT is strong however noted impaired  co-ordination during ambulation)    Cervical / Trunk Assessment Cervical / Trunk Assessment: Normal  Communication   Communication: No difficulties  Cognition Arousal/Alertness: Awake/alert Behavior During Therapy: WFL for tasks assessed/performed Overall Cognitive Status: No family/caregiver present to determine baseline cognitive functioning                                 General Comments: pt with noted decreased safety awareness and impulsivity, unsure if this is new baseline from original L SAH/SDH      General Comments General comments (skin integrity, edema, etc.): pt with laceration, swelling and bruising around L eye and L knee    Exercises     Assessment/Plan    PT Assessment Patient needs continued PT services  PT Problem List Decreased strength;Decreased balance;Decreased activity tolerance;Decreased mobility;Decreased cognition;Decreased knowledge of use of DME;Decreased safety awareness       PT Treatment Interventions DME instruction;Functional mobility training;Gait training;Stair training;Therapeutic activities;Therapeutic exercise;Balance training;Neuromuscular re-education    PT Goals (Current goals can be found in the Care Plan section)  Acute Rehab PT Goals PT Goal Formulation: With patient Time For Goal Achievement: 07/31/20 Potential to Achieve Goals: Good    Frequency Min 3X/week   Barriers to discharge        Co-evaluation               AM-PAC PT "6 Clicks" Mobility  Outcome Measure Help needed turning from your back to your side while in a flat bed without using bedrails?: A Little Help needed moving from lying on your back to sitting on the side of a flat bed without using bedrails?: A Little Help needed moving to and from a bed to a chair (including a wheelchair)?: A Little Help needed standing up from a chair using your arms (e.g., wheelchair or bedside chair)?: A Little Help needed to walk in hospital room?: A  Lot Help needed climbing 3-5 steps with a railing? : A Lot 6 Click Score: 16    End of Session Equipment Utilized During Treatment: Gait belt Activity Tolerance: Patient tolerated treatment well Patient left: in chair;with call bell/phone within reach;with chair alarm set Nurse Communication: Mobility status PT Visit Diagnosis: Unsteadiness on feet (R26.81);Repeated falls (R29.6);Other abnormalities of gait and mobility (R26.89);Muscle weakness (generalized) (M62.81)    Time: 5956-3875 PT Time Calculation (min) (ACUTE ONLY): 25 min   Charges:   PT Evaluation $PT Eval Moderate Complexity: 1 Mod PT Treatments $Gait Training: 8-22 mins        Kittie Plater, PT, DPT Acute Rehabilitation Services Pager #: [phone] Office #: [phone]   Berline Lopes 07/24/2020, 9:03 AM

## 2020-07-24 NOTE — Discharge Instructions (Signed)

## 2020-07-24 NOTE — Evaluation (Signed)
Occupational Therapy Evaluation Patient Details Name: [name] MRN: [id number] DOB: [birth year] Today's Date: 07/24/2020    History of Present Illness 70 year old female admitted from [facility] for fall hitting L side of head. Pt was in [facility] in [facility] from 2/3-2/8 with recent large left subdural hematoma requiring craniotomy and evacuation due to fall at sons house. PMH:  mechanical aortic valve replacement (2009, on coumadin), atrial fibrillation/flutter (S/P pacemaker, S/P DC cardioversion 04/2020), non-insulin-dependent diabetes mellitus type 2, migraine headaches, hypertension, hyperlipidemia, gout and bipolar disorder   Clinical Impression   Pt admitted with the above diagnosis and has the deficits listed below. Pt would benefit from cont OT to increase independence with basic adls and adl transfers so she can eventually dc home with her daughter.  Pt has significant balance deficits when on her feet.  Prior to OT, pt walked with PT with no dizziness.  After PT, pt received BP meds.  Pt very dizzy when up after meds. Sitting BP was 114/54.  Standing BP 62/50.  Once back in sitting, pt was 111/52.  Feel SNF is a good option to optimize independence with adls.     Follow Up Recommendations  SNF;Supervision/Assistance - 24 hour    Equipment Recommendations  Other (comment) (tbd)    Recommendations for Other Services       Precautions / Restrictions Precautions Precautions: Fall Precaution Comments: multiple falls lately Restrictions Weight Bearing Restrictions: No      Mobility Bed Mobility Overal bed mobility: Needs Assistance Bed Mobility: Supine to Sit     Supine to sit: Min guard     General bed mobility comments: pt was up in chair on arrival.    Transfers Overall transfer level: Needs assistance Equipment used: Rolling walker (2 wheeled) Transfers: Sit to/from [facility] Sit to Stand: Mod assist Stand pivot transfers: Mod assist        General transfer comment: Pt very unsteady getting up. Pt just received BP meds prior to OT.  Seemed to do better with mobility with PT before pt had meds.    Balance Overall balance assessment: Needs assistance Sitting-balance support: Feet supported;No upper extremity supported Sitting balance-Leahy Scale: Good Sitting balance - Comments: pt able to take pills from cup in R hand and hold drink in L hand without LOB   Standing balance support: During functional activity Standing balance-Leahy Scale: Poor Standing balance comment: dependent on RW at this time                           ADL either performed or assessed with clinical judgement   ADL Overall ADL's : Needs assistance/impaired Eating/Feeding: Set up;Sitting   Grooming: Wash/dry hands;Wash/dry face;Oral care;Set up;Sitting   Upper Body Bathing: Set up;Sitting   Lower Body Bathing: Moderate assistance;Sit to/from stand;Cueing for compensatory techniques Lower Body Bathing Details (indicate cue type and reason): Pt very unsteady when standing.  BP also dropping when standing. Upper Body Dressing : Set up;Sitting   Lower Body Dressing: Moderate assistance;Cueing for compensatory techniques;Sit to/from stand Lower Body Dressing Details (indicate cue type and reason): mod assist to stand and manage clothing. Toilet Transfer: Moderate assistance;BSC;Cueing for safety Toilet Transfer Details (indicate cue type and reason): Pt with difficulty getting fully into standing.  Once up mobilized better. Toileting- Clothing Manipulation and Hygiene: Minimal assistance;Sit to/from stand;Cueing for compensatory techniques Toileting - Clothing Manipulation Details (indicate cue type and reason): assist with balance in standing.  Functional mobility during ADLs: Moderate assistance;Rolling walker General ADL Comments: Pt limited by dizziness when up today.  Pt can reach feet.  Most difficulty come with decreased balance when  standing.     Vision Baseline Vision/History: Wears glasses Wears Glasses: Reading only Patient Visual Report: Blurring of vision Vision Assessment?: Yes Eye Alignment: Within Functional Limits Alignment/Gaze Preference: Within Defined Limits Tracking/Visual Pursuits: Able to track stimulus in all quads without difficulty Saccades: Within functional limits Convergence: Within functional limits Visual Fields: No apparent deficits Additional Comments: Pt states that vision seems blurry and sometimes "off" but tests out okay. Pt did have double vision right after the fall but that has resolved.     Perception     Praxis Praxis Praxis tested?: Within functional limits    Pertinent Vitals/Pain Pain Assessment: No/denies pain     Hand Dominance Right   Extremity/Trunk Assessment Upper Extremity Assessment Upper Extremity Assessment: Overall WFL for tasks assessed   Lower Extremity Assessment Lower Extremity Assessment: Defer to PT evaluation   Cervical / Trunk Assessment Cervical / Trunk Assessment: Normal   Communication Communication Communication: No difficulties   Cognition Arousal/Alertness: Awake/alert Behavior During Therapy: WFL for tasks assessed/performed Overall Cognitive Status: No family/caregiver present to determine baseline cognitive functioning                                 General Comments: pt with noted decreased safety awareness and impulsivity, unsure if this is new baseline from original L SAH/SDH   General Comments  Pt overall limited by poor balance when on her feet.    Exercises     Shoulder Instructions      Home Living Family/patient expects to be discharged to:: Skilled nursing facility                                 Additional Comments: typically lives with daughter and her family in 2 story home, has a full flight of stairs to access bedroom      Prior Functioning/Environment Level of Independence:  Needs assistance  Gait / Transfers Assistance Needed: pt was amb with RW prior to fall at sons ADL's / Homemaking Assistance Needed: some assist for IADLs            OT Problem List: Impaired balance (sitting and/or standing);Decreased safety awareness;Decreased knowledge of use of DME or AE      OT Treatment/Interventions: Self-care/ADL training;Therapeutic activities;DME and/or AE instruction    OT Goals(Current goals can be found in the care plan section) Acute Rehab OT Goals Patient Stated Goal: to get well enough to go home or just live at [place]. OT Goal Formulation: With patient Time For Goal Achievement: 08/07/20 Potential to Achieve Goals: Good ADL Goals Pt Will Perform Grooming: with set-up;standing Pt Will Perform Lower Body Bathing: with supervision Pt Will Perform Lower Body Dressing: with supervision Additional ADL Goal #1: Pt will walk to the bathroom with walker and complete all toileting with 3:1 over comode with supervision.  OT Frequency: Min 2X/week   Barriers to D/C:    lives with daughter who homeschools kids and works part time.       Co-evaluation              AM-PAC OT "6 Clicks" Daily Activity     Outcome Measure Help from another person eating meals?: None Help from  another person taking care of personal grooming?: None Help from another person toileting, which includes using toliet, bedpan, or urinal?: A Little Help from another person bathing (including washing, rinsing, drying)?: A Little Help from another person to put on and taking off regular upper body clothing?: A Little Help from another person to put on and taking off regular lower body clothing?: A Lot 6 Click Score: 19   End of Session Equipment Utilized During Treatment: Rolling walker Nurse Communication: Mobility status  Activity Tolerance: Patient tolerated treatment well Patient left: in chair;with call bell/phone within reach;with chair alarm set  OT Visit Diagnosis:  Unsteadiness on feet (R26.81)                Time: 2229-7989 OT Time Calculation (min): 40 min Charges:  OT General Charges $OT Visit: 1 Visit OT Evaluation $OT Eval Moderate Complexity: 1 Mod OT Treatments $Self Care/Home Management : 8-22 mins  Glenford Peers 07/24/2020, 9:45 AM

## 2020-07-24 NOTE — ED Provider Notes (Signed)
I assumed care of patient at shift change.  Plan was admit to the hospitalist for monitoring and repeat CT head.  Patient is currently on anticoagulation for metallic cardiac valve.  On my exam patient is awake alert in no acute distress.  She does denies any neck or back pain.  No signs of any extremity trauma. Cardiac exam reveals a click from heart valve  Discussed with neurosurgery APP.  Also discussed with Dr. Cyd Silence with hospitalist.  Plan will be to admit to the medical service for monitoring & repeat CT head in the morning.   Ripley Fraise, MD 07/24/20 [id number]

## 2020-07-24 NOTE — H&P (Signed)
History and Physical    MERITA HAWKS KVQ:[id number] DOB: Jun 24, 1950 DOA: 07/23/2020  PCP: Mosie Lukes, MD  Patient coming from: [facility]   Chief Complaint:  Chief Complaint  Patient presents with  . Fall     HPI:    70 year old female with past medical history of mechanical aortic valve replacement (2009, on coumadin), atrial fibrillation/flutter (S/P pacemaker, S/P DC cardioversion 04/2020), non-insulin-dependent diabetes mellitus type 2, migraine headaches, hypertension, hyperlipidemia, gout and bipolar disorder with recent large left subdural hematoma requiring craniotomy and evacuation presented to [facility] emergency department from [facility] for recurrent fall.  Of note, patient was recently hospitalized at [facility] in [place] from 2/3 until 2/28 status post fall while visiting her son.  Patient was found to have a large left subdural hematoma, subarachnoid hemorrhage and some evidence of cerebral edema requiring emergent intubation, emergent reversal of her anticoagulant and emergent neurosurgical intervention with craniotomy and evacuation of the bleed.  Patient was in the intensive care unit for the majority the hospital stay and eventually discharged to [facility] skilled nursing facility earlier in the day on 2/28.  Unfortunately, within several hours of arrival to the skilled nursing facility the patient states that she was sitting in her wheelchair and attempted to stand up, losing her balance and falling.  Patient denies loss of consciousness, lightheadedness or vertigo that precipitated the fall.  Patient did not exhibit any seizure-like activity, self urination, self defecation or tongue biting.  Patient fell to the ground, striking the left side of her head hard on the ground prompting a call to EMS who promptly brought the patient into [facility] emergency department for  evaluation.  Upon evaluation in the emergency department CT imaging of the head revealed trace acute subarachnoid hemorrhage of the right temporal lobe in addition to a minimal acute on chronic right parietal convexity subdural hematoma.  Case was discussed to the emergency department provider and Dr. Trenton Gammon with neurosurgery.  Dr. Trenton Gammon recommended repeat noncontrast CT of the head in 6 hours and overnight observation.  Dr. Trenton Gammon stated that neurosurgery would evaluate the patient in the morning.  The hospitalist group was then called to assess the patient for admission to the hospital.  Review of Systems:   Review of Systems  Musculoskeletal: Positive for falls.  Neurological: Positive for headaches.  All other systems reviewed and are negative.   History reviewed. No pertinent past medical history.  History reviewed. No pertinent surgical history.   has no history on file for tobacco use, alcohol use, and drug use.  Not on File  No family history on file.   Prior to Admission medications   Medication Sig Start Date End Date Taking? Authorizing Provider  acetaminophen (TYLENOL) 500 MG tablet Take 1,000 mg by mouth in the morning and at bedtime.   Yes [provider]  amiodarone (PACERONE) 200 MG tablet Take 200 mg by mouth 2 (two) times daily.   Yes [provider]  amLODipine (NORVASC) 10 MG tablet Take 10 mg by mouth daily.   Yes [provider]  aspirin 81 MG chewable tablet Chew 81 mg by mouth daily.   Yes [provider]  cyclobenzaprine (FLEXERIL) 10 MG tablet Take 10 mg by mouth 3 (three) times daily as needed for muscle spasms.   Yes [provider]  docusate sodium (COLACE) 100 MG capsule Take 100 mg by mouth 2 (two) times  daily.   Yes [provider]  doxycycline (DORYX) 100 MG EC tablet Take 100 mg by mouth 2 (two) times daily.   Yes [provider]  fenofibrate 160 MG tablet Take 160 mg by mouth daily.   Yes  [provider]  furosemide (LASIX) 40 MG tablet Take 40 mg by mouth daily.   Yes [provider]  insulin aspart (NOVOLOG) 100 UNIT/ML injection Inject 0-12 Units into the skin 3 (three) times daily before meals. Per sliding scale   Yes [provider]  lamoTRIgine (LAMICTAL) 100 MG tablet Take 100 mg by mouth daily.   Yes [provider]  lamoTRIgine (LAMICTAL) 200 MG tablet Take 200 mg by mouth at bedtime.   Yes [provider]  levETIRAcetam (KEPPRA) 500 MG tablet Take 500 mg by mouth 2 (two) times daily.   Yes [provider]  metoprolol succinate (TOPROL-XL) 25 MG 24 hr tablet Take 25 mg by mouth daily.   Yes [provider]  metoprolol tartrate (LOPRESSOR) 25 MG tablet Take 25 mg by mouth in the morning, at noon, and at bedtime.   Yes [provider]  OXcarbazepine (TRILEPTAL) 150 MG tablet Take 150 mg by mouth 2 (two) times daily.   Yes [provider]  pantoprazole (PROTONIX) 40 MG tablet Take 40 mg by mouth daily.   Yes [provider]  polyvinyl alcohol (LIQUIFILM TEARS) 1.4 % ophthalmic solution Place 1 drop into both eyes as needed for dry eyes.   Yes [provider]  QUEtiapine (SEROQUEL) 25 MG tablet Take 25 mg by mouth at bedtime.   Yes [provider]  rOPINIRole (REQUIP) 0.25 MG tablet Take 0.25 mg by mouth at bedtime.   Yes [provider]  rosuvastatin (CRESTOR) 40 MG tablet Take 40 mg by mouth daily.   Yes [provider]  saccharomyces boulardii (FLORASTOR) 250 MG capsule Take 250 mg by mouth daily.   Yes [provider]  senna (SENOKOT) 8.6 MG TABS tablet Take 2 tablets by mouth in the morning and at bedtime.   Yes [provider]  SUMAtriptan (IMITREX) 50 MG tablet Take 50 mg by mouth every 2 (two) hours as needed for migraine. May repeat in 2 hours if headache persists or recurs.   Yes [provider]  warfarin (COUMADIN) 3  MG tablet Take 3 mg by mouth daily.   Yes [provider]    Physical Exam: Vitals:   07/24/20 0045 07/24/20 0115 07/24/20 0130 07/24/20 0234  BP: 133/63 (!) 139/54 135/74 (!) 122/56  Pulse: 63 60 61 60  Resp: 20 (!) 22 14 17   Temp:    97.8 F (36.6 C)  TempSrc:    Oral  SpO2: 100% 100% 99% 100%  Weight:      Height:        Constitutional: Acute alert and oriented x3, no associated distress.   Skin: Notable laceration over the left eye.  Otherwise, no rashes, no lesions, somewhat poor skin turgor noted. Eyes: Pupils are equally reactive to light.  No evidence of scleral icterus or conjunctival pallor.  ENMT: Moist mucous membranes noted.  Posterior pharynx clear of any exudate or lesions.   Neck: normal, supple, no masses, no thyromegaly.  No evidence of jugular venous distension.   Respiratory: clear to auscultation bilaterally, no wheezing, no crackles. Normal respiratory effort. No accessory muscle use.  Cardiovascular: Loud mechanical click noted best over the aortic valve.  Otherwise, regular rate and rhythm,  No extremity edema. 2+ pedal pulses. No carotid bruits.  Chest:   Nontender without crepitus or deformity.   Back:   Nontender without crepitus or deformity. Abdomen: Abdomen is soft and nontender.  No evidence of intra-abdominal masses.  Positive bowel sounds noted in all quadrants.   Musculoskeletal: No joint deformity upper and lower extremities. Good ROM, no contractures. Normal muscle tone.  Neurologic: CN 2-12 grossly intact. Sensation intact.  Patient moving all 4 extremities spontaneously.  Patient is following all commands.  Patient is responsive to verbal stimuli.  Cerebellar function testing unremarkable. Psychiatric: Patient exhibits normal mood with appropriate affect.  Patient seems to possess insight as to their current situation.     Labs on Admission: I have personally reviewed following labs and imaging studies -   CBC: Recent Labs  Lab  07/23/20 2152  WBC 7.0  NEUTROABS 4.3  HGB 11.3*  HCT 35.3*  MCV 97.5  PLT 474   Basic Metabolic Panel: Recent Labs  Lab 07/23/20 2152  NA 136  K 4.2  CL 99  CO2 25  GLUCOSE 110*  BUN 36*  CREATININE 1.94*  CALCIUM 9.7   GFR: Estimated Creatinine Clearance: 31.5 mL/min (A) (by C-G formula based on SCr of 1.94 mg/dL (H)). Liver Function Tests: Recent Labs  Lab 07/23/20 2152  AST 60*  ALT 39  ALKPHOS 36*  BILITOT 0.9  PROT 7.8  ALBUMIN 4.1   No results for input(s): LIPASE, AMYLASE in the last 168 hours. No results for input(s): AMMONIA in the last 168 hours. Coagulation Profile: Recent Labs  Lab 07/23/20 2215  INR 2.5*   Cardiac Enzymes: No results for input(s): CKTOTAL, CKMB, CKMBINDEX, TROPONINI in the last 168 hours. BNP (last 3 results) No results for input(s): PROBNP in the last 8760 hours. HbA1C: No results for input(s): HGBA1C in the last 72 hours. CBG: No results for input(s): GLUCAP in the last 168 hours. Lipid Profile: No results for input(s): CHOL, HDL, LDLCALC, TRIG, CHOLHDL, LDLDIRECT in the last 72 hours. Thyroid Function Tests: No results for input(s): TSH, T4TOTAL, FREET4, T3FREE, THYROIDAB in the last 72 hours. Anemia Panel: No results for input(s): VITAMINB12, FOLATE, FERRITIN, TIBC, IRON, RETICCTPCT in the last 72 hours. Urine analysis:    Component Value Date/Time   COLORURINE YELLOW 07/24/2020 0108   APPEARANCEUR CLOUDY (A) 07/24/2020 0108   LABSPEC 1.008 07/24/2020 0108   PHURINE 5.0 07/24/2020 0108   GLUCOSEU NEGATIVE 07/24/2020 0108   HGBUR SMALL (A) 07/24/2020 0108   BILIRUBINUR NEGATIVE 07/24/2020 0108   KETONESUR NEGATIVE 07/24/2020 0108   PROTEINUR NEGATIVE 07/24/2020 0108   NITRITE NEGATIVE 07/24/2020 0108   LEUKOCYTESUR LARGE (A) 07/24/2020 0108    Radiological Exams on Admission - Personally Reviewed: CT Head Wo Contrast  Result Date: 07/23/2020 CLINICAL DATA:  Head trauma EXAM: CT HEAD WITHOUT CONTRAST  TECHNIQUE: Contiguous axial images were obtained from the base of the skull through the vertex without intravenous contrast. COMPARISON:  06/13/2020 FINDINGS: Brain: There is a small amount of acute subarachnoid hemorrhage along the right temporal lobe, reference images 12 and 13. Complex low-attenuation fluid along the right parietal convexity consistent with chronic subdural hematoma. On coronal reconstructed images there are thin areas of increased attenuation along the periphery which could reflect superimposed acute hemorrhage. Sequela from previous left frontotemporal craniotomy, with dural calcifications along the surgical margins. No acute infarct. Lateral ventricles and midline structures are unremarkable. No mass effect. Vascular: No hyperdense vessel or unexpected calcification. Skull: No acute bony abnormalities.  Sinuses/Orbits: No acute finding. Other: None. IMPRESSION: 1. Trace acute subarachnoid hemorrhage along the right temporal lobe. 2. Minimal acute on chronic right parietal convexity subdural hematoma. No mass effect. 3. Postsurgical changes from left frontotemporal craniotomy, with underlying dural calcifications. Critical Value/emergent results were called by telephone at the time of interpretation on 07/23/2020 at 10:25 pm to provider DAN FLOYD , who verbally acknowledged these results. Electronically Signed   By: Randa Ngo M.D.   On: 07/23/2020 22:25   DG Chest Port 1 View  Result Date: 07/23/2020 CLINICAL DATA:  Fall.  Patient was started on warfarin today. EXAM: PORTABLE CHEST 1 VIEW COMPARISON:  04/29/2020 FINDINGS: Patient's LEFT-sided transvenous pacemaker with leads to the RIGHT atrium and RIGHT ventricle. Median sternotomy and valve replacement. Heart size is mildly enlarged, stable. No focal consolidations or pleural effusions. No pulmonary edema. No pneumothorax or acute fracture. Surgical clips overlie the RIGHT axillary region consistent with previous axillary node  dissection. IMPRESSION: 1. Stable cardiomegaly. 2. No evidence for acute abnormality. Electronically Signed   By: Nolon Nations M.D.   On: 07/23/2020 22:02    EKG: Personally reviewed.  Rhythm is junctional rhythm with heart rate of 62 bpm.  No dynamic ST segment changes appreciated.  Assessment/Plan Principal Problem:   Subarachnoid hematoma and subdural hematoma   Patient unfortunately has experienced yet another fall within hours of arriving to her skilled nursing facility resulting in head trauma  CT head on arrival to the emergency department revealing trace acute subarachnoid hemorrhage of the right temporal lobe in addition to minimal acute on chronic right parietal subdural hematoma  Patient is at extremely high risk due to ongoing need for anticoagulation with Coumadin due to presence of aortic mechanical valve  Patient is additionally at extremely high risk of seizure activity  Case discussed with Dr. Trenton Gammon by the emergency department provider who stated that neurosurgery will see the patient in the morning for formal evaluation.  He additionally recommends repeat noncontrast CT imaging of the head 6 hours after the initial.  Performing serial neurologic checks  Seizure precautions  Antiepileptic regimen discussed with Dr. Theda Sers with neurology.  He recommends continuing previously prescribed regimen of antiepileptics including Lamictal and Keppra.   Active Problems:  History of aortic mechanical valve replacement   Patient has been on chronic Coumadin used for anticoagulation due to history of aortic mechanical valve replacement performed in 2009  Unfortunately even with the presence of small subdural hemorrhage and subarachnoid hemorrhage patient requires some form of anticoagulation  Monitoring patient closely for complications and expansion of intracranial hemorrhage  Continuing Coumadin 3 mg daily  Recently prescribed ciprofloxacin has been switched to a  cephalosporin to minimize impact on INR some advice from pharmacy  Acute kidney injury superimposed on chronic kidney disease stage IIIa   Etiology of acute kidney injury unclear  Baseline creatinine based on our records reveals a baseline creatinine of between 1.2-1.4, patient is presenting with a creatinine of 1.94 today  Temporarily holding home regimen of daily Lasix  Gently hydrating with low rate lactated Ringer solution  Strict input and output monitoring  Attempting to avoid nephrotoxic agents if at all possible  Monitoring renal function with serial chemistries    Fall at home, initial encounter   Patient hospitalized for yet another fall, patient has presented many times to both our health system and others for frequent falls and the sequela of them  Because of patient's frequent falls has never been quite clear, and has  been postulated that patient is falling frequently due to history of previous strokes  Another possible cause may be polypharmacy - patient is on a very large number of medications several of which are quite sedating.  Of note, based on discharge summary from [facility] patient is still prescribed 600 mg of Seroquel nightly (although SNF says 25mg  QHS).  We will hold this medication for now.  Additionally, patient still has Requip nightly prescribed at her SNF (although this was supposed to be DC'ed based on [place] summary).  This will will also be discontinued.  TSH, B12 have already been recently checked in the past several months.  It is possible patient is on too many antihypertensives  - Will obtain orthostatic vital signs in the morning considering ongoing diuretic use.  Completing treatment of recently diagnosed urinary tract infection at [facility]    Acute cystitis without hematuria   Documented diagnosis of urinary tract infection at [facility] day prior to discharge  Culture  still pending but Gram stain was revealing gram-negative rods at time of discharge  Patient was prescribed ciprofloxacin at time of discharge however pharmacy recommends choosing a cephalosporin due to less risk for impact on INR  Will instead place patient on Omnicef 300 mg twice daily to complete a 3-day course of antibiotic therapy    Acute on chronic combined systolic and diastolic CHF (congestive heart failure) (HCC)   No evidence of cardiogenic volume overload  Temporarily holding diuretic in light of mild acute kidney injury    Essential hypertension   Continue home regimen of amlodipine, metoprolol for now.  Monitoring blood pressures closely to ensure that they are well controlled with this regimen and not excessively lowering blood pressure to the point where patient will be predisposed to falls.    Atrial fibrillation and flutter ([place])   We will continue home regimen of metoprolol 25 mg 3 times daily based on discharge summary from [facility].  SNF records erroneously have metoprolol listed twice  Monitoring patient on telemetry    GERD without esophagitis    Continuing daily PPI   Code Status:  DNR Family Communication: Deferred  Status is: Observation  The patient remains OBS appropriate and will d/c before 2 midnights.  Dispo: The patient is from: Home              Anticipated d/c is to: Home              Patient currently is not medically stable to d/c.   Difficult to place patient No        Vernelle Emerald MD [facility] Pager [phone]  If 7PM-7AM, please contact night-coverage [email or web address] Use universal [place] password for that web site. If you do not have the password, please call the hospital operator.  07/24/2020, 3:54 AM

## 2020-07-24 NOTE — Progress Notes (Signed)
Appropriate Use Committee Chart Review  Chart reviewed by the physician advisor with input from [facility] and the attending MD as needed for review of the appropriateness for SNF referral.  TOC notes, PT/OT/ST notes, nursing notes and physician notes reviewed for medical necessity to determine if the patient's needs are appropriate for short-term rehab to return to a prior level of function versus the likely need for custodial care.  Patient admitted from [facility], appropriate to return.   Jacquelynn Cree, MD Chief Physician Advisor  @td @ @now @

## 2020-07-24 NOTE — Consult Note (Addendum)
Providing Compassionate, Quality Care - Together   Reason for Consult: SDH, [place] Referring Physician: Dr. Sharyne Richters is an 70 y.o. female.  HPI: Patient is a 70 year old female with a history of bipolar disorder, anemia,  frailty with multiple falls, aortic valve replacement with a mechanical prosthesis in 2011, anticoagulation with Coumadin, paroxysmal atrial fibrillation, type 2 diabetes mellitus, permanent pacemaker implantation in August 2021, and recently amiodarone therapy for rhythm control. She suffered a fall, sustaining a left-sided subdural hematoma while visiting her son in [place], [name]. She underwent a left frontoparietal craniotomy at [facility] at the beginning of February. She discharged to [facility] in [place], [place] on 07/23/2020. She attempted to stand from her wheelchair and lost her balance. She struck her head and sustained a laceration to her left orbit. She was brought in by EMS to [facility] emergency department. Imaging revealed trace acute subarachnoid hemorrhage along the right temporal lobe and minimal acute on chronic right parietal convexity subdural hematoma. There was no mass effect. She has postsurgical changes from left frontotemporal craniotomy, with underlying dural calcifications. Patient presently denies headache, nausea, vomiting, weakness, visual changes, issues with memory, or weakness. She does report light-headedness with standing. Occupational therapy is with patient at the time of this assessment. She reports significant orthostatic hypotension while working with the patient.     Past Medical History:  Diagnosis Date  . ANEMIA 05/28/2010  . AORTIC VALVE REPLACEMENT, HX OF 03/11/2010   Mechanical prosthesis  . BIPOLAR AFFECTIVE DISORDER 03/11/2010  . Depression   . Diverticulitis 12/18/2010  . FIBROIDS, UTERUS 03/11/2010  . Gout 09/04/2016  . Grave's disease 8-12  . Hyperlipidemia associated with type 2 diabetes  mellitus ([place]) 06/05/2016  . Hypertension   . Low back pain 11/02/2017  . Migraine 06/05/2016  . Mixed hyperlipidemia 03/11/2010  . Obesity   . Skin cancer   . Syncope 08/22/2019  . UTI (lower urinary tract infection) 08/14/2011        Patient Active Problem List   Diagnosis Date Noted  . Vitamin B 12 deficiency 05/06/2020  . Supratherapeutic INR 04/30/2020  . Elevated liver enzymes 04/30/2020  . Elevated TSH 04/30/2020  . Altered mental status 04/29/2020  . Tachycardia-bradycardia syndrome ([place]) 04/25/2020  . Pacemaker 04/25/2020  . Recurrent falls 03/27/2020  . Bradycardia 08/23/2019  . Prolonged QT interval   . Near syncope 08/21/2019  . GAD (generalized anxiety disorder) 04/25/2018  . Ankle pain 02/04/2018  . Nocturia 11/02/2017  . Low back pain 11/02/2017  . Type 2 diabetes mellitus with diabetic polyneuropathy, without long-term current use of insulin ([place]) 11/02/2017  . Hyperlipidemia associated with type 2 diabetes mellitus ([place]) 06/05/2016  . Migraine 06/05/2016  . Encounter for therapeutic drug monitoring 09/13/2013  . Essential hypertension, benign 04/05/2013  . Atrial fibrillation ([place]) 03/18/2013  . Long term current use of anticoagulant therapy 03/11/2013  . UTI (urinary tract infection) 02/10/2013  . Pernicious anemia 02/21/2011  . Renal insufficiency 12/30/2010  . Hyperthyroidism 12/18/2010  . Cellulitis and abscess of left leg 11/18/2010  . Anemia 05/28/2010  . Bipolar disorder ([place]) 03/11/2010  . DIVERTICULOSIS OF COLON 03/11/2010  . Constipation 03/11/2010  . Hx of mechanical aortic valve replacement 03/11/2010         Past Surgical History:  Procedure Laterality Date  . ABDOMINAL HYSTERECTOMY  ?1998   sb/l spo, total for fibroids/heavy bleeding  . ANKLE SURGERY Left 01/2018   hardware removal  .  AORTIC VALVE REPLACEMENT  2011   Mechanical prosthesis, [employer]  . APPENDECTOMY     Required revision for EColi infection, required  recurrent packing  . bowel obstruction     Requiring adhesions to be removed  . CARDIOVERSION N/A 05/09/2020   Procedure: CARDIOVERSION;  Surgeon: [name], MD;  Location: [place];  Service: Cardiovascular;  Laterality: N/A;  . CHOLECYSTECTOMY    . COLONOSCOPY WITH PROPOFOL N/A 11/01/2015   Procedure: COLONOSCOPY WITH PROPOFOL;  Surgeon: Milus Banister, MD;  Location: [facility];  Service: Endoscopy;  Laterality: N/A;  . CYSTOCELE REPAIR N/A 10/16/2015   Procedure: ANTERIOR REPAIR (CYSTOCELE);  Surgeon: Donnamae Jude, MD;  Location: [facility];  Service: Gynecology;  Laterality: N/A;  . HERNIA REPAIR  08-16-10  . LEFT HEART CATHETERIZATION WITH CORONARY ANGIOGRAM N/A 12/05/2011   Procedure: LEFT HEART CATHETERIZATION WITH CORONARY ANGIOGRAM;  Surgeon: Sinclair Grooms, MD;  Location: [facility] CATH LAB;  Service: Cardiovascular;  Laterality: N/A;  . PACEMAKER IMPLANT N/A 01/09/2020   Procedure: PACEMAKER IMPLANT;  Surgeon: Evans Lance, MD;  Location: [place] [facility];  Service: Cardiovascular;  Laterality: N/A;  . TOTAL KNEE ARTHROPLASTY Right 02/24/2013   Procedure: RIGHT TOTAL KNEE ARTHROPLASTY;  Surgeon: Johnn Hai, MD;  Location: [facility];  Service: Orthopedics;  Laterality: Right;  . TUBAL LIGATION    . varicose vein surgery b/l legs                          OB History    Gravida  2   Para  2   Term  2   Preterm      AB      Living  2     SAB      IAB      Ectopic      Multiple      Live Births                   Family History  Problem Relation Age of Onset  . Scoliosis Mother   . Arthritis Mother        Rheumatoid  . Aneurysm Mother        heart  . Alcohol abuse Father   . Depression Sister   . Depression Brother   . Alcohol abuse Brother   . Cancer Maternal Grandmother        colon  . Diabetes Maternal Grandfather   . Heart disease Maternal Grandfather   . Alcohol abuse Maternal Grandfather   .  Depression Paternal Grandmother   . Diabetes Paternal Grandmother   . Depression Paternal Grandfather   . Diabetes Paternal Grandfather   . Depression Sister   . Alcohol abuse Brother   . Depression Brother   . Heart disease Brother     Social History        Tobacco Use  . Smoking status: Former Smoker    Quit date: 05/26/1990    Years since quitting: 30.0  . Smokeless tobacco: Never Used  Vaping Use  . Vaping Use: Never used  Substance Use Topics  . Alcohol use: Yes    Alcohol/week: 0.0 standard drinks    Comment: rare  . Drug use: No      Allergies            Mucinex [guaifenesin er], Keflex [cephalexin], Sulfa antibiotics, and Sulfonamide derivatives    Medications: I have reviewed the patient's current medications.  Results for orders placed or performed during the hospital encounter of 07/23/20 (from the past 48 hour(s))  CBC with Differential     Status: Abnormal   Collection Time: 07/23/20  9:52 PM  Result Value Ref Range   WBC 7.0 4.0 - 10.5 K/uL   RBC 3.62 (L) 3.87 - 5.11 MIL/uL   Hemoglobin 11.3 (L) 12.0 - 15.0 g/dL   HCT 35.3 (L) 36.0 - 46.0 %   MCV 97.5 80.0 - 100.0 fL   MCH 31.2 26.0 - 34.0 pg   MCHC 32.0 30.0 - 36.0 g/dL   RDW 14.5 11.5 - 15.5 %   Platelets 243 150 - 400 K/uL   nRBC 0.0 0.0 - 0.2 %   Neutrophils Relative % 61 %   Neutro Abs 4.3 1.7 - 7.7 K/uL   Lymphocytes Relative 20 %   Lymphs Abs 1.4 0.7 - 4.0 K/uL   Monocytes Relative 13 %   Monocytes Absolute 0.9 0.1 - 1.0 K/uL   Eosinophils Relative 4 %   Eosinophils Absolute 0.3 0.0 - 0.5 K/uL   Basophils Relative 1 %   Basophils Absolute 0.1 0.0 - 0.1 K/uL   Immature Granulocytes 1 %   Abs Immature Granulocytes 0.04 0.00 - 0.07 K/uL    Comment: Performed at [facility], [address] [place], [place] [address]  Comprehensive metabolic panel     Status: Abnormal   Collection Time: 07/23/20  9:52 PM  Result Value Ref Range   Sodium 136 135 - 145 mmol/L    Potassium 4.2 3.5 - 5.1 mmol/L   Chloride 99 98 - 111 mmol/L   CO2 25 22 - 32 mmol/L   Glucose, Bld 110 (H) 70 - 99 mg/dL    Comment: Glucose reference range applies only to samples taken after fasting for at least 8 hours.   BUN 36 (H) 8 - 23 mg/dL   Creatinine, Ser 1.94 (H) 0.44 - 1.00 mg/dL   Calcium 9.7 8.9 - 10.3 mg/dL   Total Protein 7.8 6.5 - 8.1 g/dL   Albumin 4.1 3.5 - 5.0 g/dL   AST 60 (H) 15 - 41 U/L   ALT 39 0 - 44 U/L   Alkaline Phosphatase 36 (L) 38 - 126 U/L   Total Bilirubin 0.9 0.3 - 1.2 mg/dL   GFR, Estimated 28 (L) >60 mL/min    Comment: (NOTE) Calculated using the CKD-EPI Creatinine Equation (2021)    Anion gap 12 5 - 15    Comment: Performed at [facility] [address], [place], [place] [address]  Troponin I (High Sensitivity)     Status: None   Collection Time: 07/23/20  9:52 PM  Result Value Ref Range   Troponin I (High Sensitivity) 16 <18 ng/L    Comment: (NOTE) Elevated high sensitivity troponin I (hsTnI) values and significant  changes across serial measurements may suggest ACS but many other  chronic and acute conditions are known to elevate hsTnI results.  Refer to the "Links" section for chest pain algorithms and additional  guidance. Performed at [facility], [place] [address], [place],  [address]   Protime-INR     Status: Abnormal   Collection Time: 07/23/20 10:15 PM  Result Value Ref Range   Prothrombin Time 26.1 (H) 11.4 - 15.2 seconds   INR 2.5 (H) 0.8 - 1.2    Comment: (NOTE) INR goal varies based on device and disease states. Performed at [facility], [place]  [address] [place], [place] [address]   SARS CORONAVIRUS 2 (TAT 6-24 HRS) Nasopharyngeal Nasopharyngeal Swab     Status: None   Collection Time: 07/24/20 12:30 AM   Specimen: Nasopharyngeal Swab  Result Value Ref Range   SARS Coronavirus 2 NEGATIVE NEGATIVE    Comment: (NOTE) SARS-CoV-2 target nucleic acids are NOT DETECTED.  The SARS-CoV-2 RNA is  generally detectable in upper and lower respiratory specimens during the acute phase of infection. Negative results do not preclude SARS-CoV-2 infection, do not rule out co-infections with other pathogens, and should not be used as the sole basis for treatment or other patient management decisions. Negative results must be combined with clinical observations, patient history, and epidemiological information. The expected result is Negative.  Fact Sheet for Patients: SugarRoll.be  Fact Sheet for Healthcare Providers: https://www.woods-mathews.com/  This test is not yet approved or cleared by the [place] FDA and  has been authorized for detection and/or diagnosis of SARS-CoV-2 by FDA under an Emergency Use Authorization (EUA). This EUA will remain  in effect (meaning this test can be used) for the duration of the COVID-19 declaration under Se ction 564(b)(1) of the Act, 21 U.S.C. section 360bbb-3(b)(1), unless the authorization is terminated or revoked sooner.  Performed at [facility], [place] [address], [place], [place] [address]   Urinalysis, Routine w reflex microscopic     Status: Abnormal   Collection Time: 07/24/20  1:08 AM  Result Value Ref Range   Color, Urine YELLOW YELLOW   APPearance CLOUDY (A) CLEAR   Specific Gravity, Urine 1.008 1.005 - 1.030   pH 5.0 5.0 - 8.0   Glucose, UA NEGATIVE NEGATIVE mg/dL   Hgb urine dipstick SMALL (A) NEGATIVE   Bilirubin Urine NEGATIVE NEGATIVE   Ketones, ur NEGATIVE NEGATIVE mg/dL   Protein, ur NEGATIVE NEGATIVE mg/dL   Nitrite NEGATIVE NEGATIVE   Leukocytes,Ua LARGE (A) NEGATIVE   RBC / HPF 6-10 0 - 5 RBC/hpf   WBC, UA 21-50 0 - 5 WBC/hpf   Bacteria, UA FEW (A) NONE SEEN   Squamous Epithelial / LPF 11-20 0 - 5   Mucus PRESENT    Hyaline Casts, UA PRESENT    Non Squamous Epithelial 0-5 (A) NONE SEEN    Comment: Performed at [facility], [place] [address], [place],  [place] [address]  Hemoglobin A1c     Status: None   Collection Time: 07/24/20  3:58 AM  Result Value Ref Range   Hgb A1c MFr Bld 4.8 4.8 - 5.6 %    Comment: (NOTE) Pre diabetes:          5.7%-6.4%  Diabetes:              >6.4%  Glycemic control for   <7.0% adults with diabetes    Mean Plasma Glucose 91.06 mg/dL    Comment: Performed at [facility] [address] [place], [place] [address]  Lipid panel     Status: None   Collection Time: 07/24/20  3:58 AM  Result Value Ref Range   Cholesterol 147 0 - 200 mg/dL   Triglycerides 124 <150 mg/dL   HDL 62 >40 mg/dL   Total CHOL/HDL Ratio 2.4 RATIO   VLDL 25 0 - 40 mg/dL   LDL Cholesterol 60 0 - 99 mg/dL    Comment:        Total Cholesterol/HDL:CHD Risk Coronary Heart Disease Risk Table  Men   Women  1/2 Average Risk   3.4   3.3  Average Risk       5.0   4.4  2 X Average Risk   9.6   7.1  3 X Average Risk  23.4   11.0        Use the calculated Patient Ratio above and the CHD Risk Table to determine the patient's CHD Risk.        ATP III CLASSIFICATION (LDL):  <100     mg/dL   Optimal  100-129  mg/dL   Near or Above                    Optimal  130-159  mg/dL   Borderline  160-189  mg/dL   High  >190     mg/dL   Very High Performed at [facility] [address], [place], [place] [address]   Comprehensive metabolic panel     Status: Abnormal   Collection Time: 07/24/20  3:58 AM  Result Value Ref Range   Sodium 139 135 - 145 mmol/L   Potassium 3.6 3.5 - 5.1 mmol/L   Chloride 103 98 - 111 mmol/L   CO2 27 22 - 32 mmol/L   Glucose, Bld 102 (H) 70 - 99 mg/dL    Comment: Glucose reference range applies only to samples taken after fasting for at least 8 hours.   BUN 29 (H) 8 - 23 mg/dL   Creatinine, Ser 1.51 (H) 0.44 - 1.00 mg/dL   Calcium 9.1 8.9 - 10.3 mg/dL   Total Protein 6.1 (L) 6.5 - 8.1 g/dL   Albumin 3.3 (L) 3.5 - 5.0 g/dL   AST 42 (H) 15 - 41 U/L   ALT 32 0 - 44 U/L   Alkaline  Phosphatase 32 (L) 38 - 126 U/L   Total Bilirubin 0.8 0.3 - 1.2 mg/dL   GFR, Estimated 37 (L) >60 mL/min    Comment: (NOTE) Calculated using the CKD-EPI Creatinine Equation (2021)    Anion gap 9 5 - 15    Comment: Performed at [facility], [place] [address], [place], [place] [address]  APTT     Status: Abnormal   Collection Time: 07/24/20  3:58 AM  Result Value Ref Range   aPTT 43 (H) 24 - 36 seconds    Comment:        IF BASELINE aPTT IS ELEVATED, SUGGEST PATIENT RISK ASSESSMENT BE USED TO DETERMINE APPROPRIATE ANTICOAGULANT THERAPY. Performed at [facility], [place] [address] [place], [place] [address]   CBC     Status: Abnormal   Collection Time: 07/24/20  3:58 AM  Result Value Ref Range   WBC 6.8 4.0 - 10.5 K/uL   RBC 3.00 (L) 3.87 - 5.11 MIL/uL   Hemoglobin 9.5 (L) 12.0 - 15.0 g/dL   HCT 28.6 (L) 36.0 - 46.0 %   MCV 95.3 80.0 - 100.0 fL   MCH 31.7 26.0 - 34.0 pg   MCHC 33.2 30.0 - 36.0 g/dL   RDW 14.5 11.5 - 15.5 %   Platelets 207 150 - 400 K/uL   nRBC 0.0 0.0 - 0.2 %    Comment: Performed at [facility], [place] [address], [place],  [address]  Protime-INR     Status: Abnormal   Collection Time: 07/24/20  3:58 AM  Result Value Ref Range   Prothrombin Time 28.7 (H) 11.4 - 15.2 seconds   INR  2.8 (H) 0.8 - 1.2    Comment: (NOTE) INR goal varies based on device and disease states. Performed at [facility], [place] [address], [place], [place] [address]   Glucose, capillary     Status: None   Collection Time: 07/24/20  4:29 AM  Result Value Ref Range   Glucose-Capillary 92 70 - 99 mg/dL    Comment: Glucose reference range applies only to samples taken after fasting for at least 8 hours.    CT HEAD WO CONTRAST  Result Date: 07/24/2020 CLINICAL DATA:  Subarachnoid hemorrhage EXAM: CT HEAD WITHOUT CONTRAST TECHNIQUE: Contiguous axial images were obtained from the base of the skull through the vertex without intravenous contrast. COMPARISON:   07/23/2020 FINDINGS: Brain: Left frontotemporal craniotomy has been performed. Stable small left subdural fluid and punctate foci of gas subjacent to the craniotomy flap. No significant associated mass effect. Stable small right subdural hematoma measuring 11-12 mm thick. Minimal mass effect upon the right cerebral hemisphere. No midline shift. Trace subarachnoid hemorrhage superficial to the anterior pole of the right temporal lobe has improved in the interval since prior examination, nearly resolved. No interval hemorrhage. Ventricular size is normal. Cerebellum is unremarkable. Vascular: No asymmetric hyperdense vasculature at the skull base Skull: Otherwise intact Sinuses/Orbits: The visualized paranasal sinuses are clear. The orbits are unremarkable. Other: None IMPRESSION: Stable right subdural hematoma with mild mass effect upon the right cerebral hemisphere. Improving right trace subarachnoid hemorrhage, now nearly resolved. Surgical changes of left frontotemporal craniotomy are again identified with stable fluid and punctate foci of gas subjacent to the craniotomy flap. Electronically Signed   By: Fidela Salisbury MD   On: 07/24/2020 04:57   CT Head Wo Contrast  Result Date: 07/23/2020 CLINICAL DATA:  Head trauma EXAM: CT HEAD WITHOUT CONTRAST TECHNIQUE: Contiguous axial images were obtained from the base of the skull through the vertex without intravenous contrast. COMPARISON:  06/13/2020 FINDINGS: Brain: There is a small amount of acute subarachnoid hemorrhage along the right temporal lobe, reference images 12 and 13. Complex low-attenuation fluid along the right parietal convexity consistent with chronic subdural hematoma. On coronal reconstructed images there are thin areas of increased attenuation along the periphery which could reflect superimposed acute hemorrhage. Sequela from previous left frontotemporal craniotomy, with dural calcifications along the surgical margins. No acute infarct. Lateral  ventricles and midline structures are unremarkable. No mass effect. Vascular: No hyperdense vessel or unexpected calcification. Skull: No acute bony abnormalities. Sinuses/Orbits: No acute finding. Other: None. IMPRESSION: 1. Trace acute subarachnoid hemorrhage along the right temporal lobe. 2. Minimal acute on chronic right parietal convexity subdural hematoma. No mass effect. 3. Postsurgical changes from left frontotemporal craniotomy, with underlying dural calcifications. Critical Value/emergent results were called by telephone at the time of interpretation on 07/23/2020 at 10:25 pm to provider DAN FLOYD , who verbally acknowledged these results. Electronically Signed   By: Randa Ngo M.D.   On: 07/23/2020 22:25   DG Chest Port 1 View  Result Date: 07/23/2020 CLINICAL DATA:  Fall.  Patient was started on warfarin today. EXAM: PORTABLE CHEST 1 VIEW COMPARISON:  04/29/2020 FINDINGS: Patient's LEFT-sided transvenous pacemaker with leads to the RIGHT atrium and RIGHT ventricle. Median sternotomy and valve replacement. Heart size is mildly enlarged, stable. No focal consolidations or pleural effusions. No pulmonary edema. No pneumothorax or acute fracture. Surgical clips overlie the RIGHT axillary region consistent with previous axillary node dissection. IMPRESSION: 1. Stable cardiomegaly. 2. No evidence for acute abnormality. Electronically Signed  By: Nolon Nations M.D.   On: 07/23/2020 22:02    Review of Systems  Constitutional: Negative.   HENT: Negative.   Eyes: Negative for photophobia and visual disturbance.       Left eye pain  Respiratory: Negative.   Cardiovascular: Negative.   Gastrointestinal: Negative.   Endocrine: Negative.   Musculoskeletal: Negative.   Skin: Negative.   Allergic/Immunologic: Negative.   Neurological: Positive for dizziness, light-headedness and headaches. Negative for seizures, syncope, facial asymmetry, speech difficulty, weakness and numbness.   Hematological: Negative.   Psychiatric/Behavioral: Negative.    Blood pressure 118/68, pulse 68, temperature 97.7 F (36.5 C), temperature source Oral, resp. rate 19, height 5\' 8"  (1.727 m), weight 86.6 kg, SpO2 99 %. Physical Exam Constitutional:      Appearance: Normal appearance.  HENT:     Head: Normocephalic.     Nose: Nose normal.     Mouth/Throat:     Mouth: Mucous membranes are moist.     Pharynx: Oropharynx is clear.  Eyes:     Extraocular Movements: Extraocular movements intact.     Conjunctiva/sclera: Conjunctivae normal.     Pupils: Pupils are equal, round, and reactive to light.     Comments: Laceration at left orbit closed with Dermabond  Cardiovascular:     Rate and Rhythm: Normal rate.  Pulmonary:     Effort: Pulmonary effort is normal. No respiratory distress.  Abdominal:     General: Bowel sounds are normal.     Palpations: Abdomen is soft.  Musculoskeletal:        General: Normal range of motion.     Cervical back: Normal range of motion and neck supple. No tenderness.  Skin:    General: Skin is warm and dry.     Capillary Refill: Capillary refill takes less than 2 seconds.  Neurological:     General: No focal deficit present.     Mental Status: She is alert and oriented to person, place, and time. Mental status is at baseline.  Psychiatric:        Mood and Affect: Mood normal.        Behavior: Behavior normal.     Assessment/Plan: Patient with subacute right-sided SDH and small amount of SAH. She is approximately one month status post left frontotemporal craniotomy for SDH evacuation. Her follow up imaging on 07/24/2020 demonstrated stable appearance of SDH. She must remain on Coumadin due to her mechanical valve and atrial fibrillation. There is no Neurosurgical intervention recommended at this time. Re-imaging only necessary if there is a decline in the patient's mental status. Patient can follow up as an outpatient with Dr. Annette Stable. The patient is ok to  discharge from a neurosurgical perspective once the primary team feel she is medically stable.  Patricia Nettle 07/24/2020, 9:27 AM

## 2020-07-25 DIAGNOSIS — F3130 Bipolar disorder, current episode depressed, mild or moderate severity, unspecified: Secondary | ICD-10-CM | POA: Diagnosis not present

## 2020-07-25 DIAGNOSIS — W1789XA Other fall from one level to another, initial encounter: Secondary | ICD-10-CM | POA: Diagnosis not present

## 2020-07-25 DIAGNOSIS — R262 Difficulty in walking, not elsewhere classified: Secondary | ICD-10-CM | POA: Diagnosis not present

## 2020-07-25 DIAGNOSIS — I480 Paroxysmal atrial fibrillation: Secondary | ICD-10-CM | POA: Diagnosis not present

## 2020-07-25 DIAGNOSIS — S065X9A Traumatic subdural hemorrhage with loss of consciousness of unspecified duration, initial encounter: Secondary | ICD-10-CM | POA: Diagnosis not present

## 2020-07-25 DIAGNOSIS — G936 Cerebral edema: Secondary | ICD-10-CM | POA: Diagnosis not present

## 2020-07-25 DIAGNOSIS — J9601 Acute respiratory failure with hypoxia: Secondary | ICD-10-CM | POA: Diagnosis not present

## 2020-07-25 DIAGNOSIS — I509 Heart failure, unspecified: Secondary | ICD-10-CM | POA: Diagnosis not present

## 2020-07-25 DIAGNOSIS — S065X9 Traumatic subdural hemorrhage with loss of consciousness of unspecified duration: Secondary | ICD-10-CM | POA: Diagnosis not present

## 2020-07-25 DIAGNOSIS — W19XXXA Unspecified fall, initial encounter: Secondary | ICD-10-CM | POA: Diagnosis not present

## 2020-07-25 DIAGNOSIS — S066X0A Traumatic subarachnoid hemorrhage without loss of consciousness, initial encounter: Secondary | ICD-10-CM | POA: Diagnosis not present

## 2020-07-25 DIAGNOSIS — Z9181 History of falling: Secondary | ICD-10-CM | POA: Diagnosis not present

## 2020-07-25 DIAGNOSIS — N183 Chronic kidney disease, stage 3 unspecified: Secondary | ICD-10-CM | POA: Diagnosis not present

## 2020-07-25 DIAGNOSIS — I10 Essential (primary) hypertension: Secondary | ICD-10-CM | POA: Diagnosis not present

## 2020-07-25 DIAGNOSIS — N179 Acute kidney failure, unspecified: Secondary | ICD-10-CM | POA: Diagnosis not present

## 2020-07-25 DIAGNOSIS — R41841 Cognitive communication deficit: Secondary | ICD-10-CM | POA: Diagnosis not present

## 2020-07-25 DIAGNOSIS — I5043 Acute on chronic combined systolic (congestive) and diastolic (congestive) heart failure: Secondary | ICD-10-CM | POA: Diagnosis not present

## 2020-07-25 DIAGNOSIS — R278 Other lack of coordination: Secondary | ICD-10-CM | POA: Diagnosis not present

## 2020-07-25 DIAGNOSIS — F319 Bipolar disorder, unspecified: Secondary | ICD-10-CM | POA: Diagnosis not present

## 2020-07-25 DIAGNOSIS — S065X0A Traumatic subdural hemorrhage without loss of consciousness, initial encounter: Secondary | ICD-10-CM | POA: Diagnosis not present

## 2020-07-25 DIAGNOSIS — I152 Hypertension secondary to endocrine disorders: Secondary | ICD-10-CM | POA: Diagnosis not present

## 2020-07-25 DIAGNOSIS — N3000 Acute cystitis without hematuria: Secondary | ICD-10-CM | POA: Diagnosis not present

## 2020-07-25 DIAGNOSIS — N1831 Chronic kidney disease, stage 3a: Secondary | ICD-10-CM | POA: Diagnosis not present

## 2020-07-25 DIAGNOSIS — Z029 Encounter for administrative examinations, unspecified: Secondary | ICD-10-CM | POA: Diagnosis not present

## 2020-07-25 DIAGNOSIS — R2689 Other abnormalities of gait and mobility: Secondary | ICD-10-CM | POA: Diagnosis not present

## 2020-07-25 DIAGNOSIS — Z952 Presence of prosthetic heart valve: Secondary | ICD-10-CM | POA: Diagnosis not present

## 2020-07-25 DIAGNOSIS — E785 Hyperlipidemia, unspecified: Secondary | ICD-10-CM | POA: Diagnosis not present

## 2020-07-25 DIAGNOSIS — G4700 Insomnia, unspecified: Secondary | ICD-10-CM | POA: Diagnosis not present

## 2020-07-25 DIAGNOSIS — F3132 Bipolar disorder, current episode depressed, moderate: Secondary | ICD-10-CM | POA: Diagnosis not present

## 2020-07-25 DIAGNOSIS — R296 Repeated falls: Secondary | ICD-10-CM | POA: Diagnosis not present

## 2020-07-25 DIAGNOSIS — S066X9D Traumatic subarachnoid hemorrhage with loss of consciousness of unspecified duration, subsequent encounter: Secondary | ICD-10-CM | POA: Diagnosis not present

## 2020-07-25 DIAGNOSIS — E039 Hypothyroidism, unspecified: Secondary | ICD-10-CM | POA: Diagnosis not present

## 2020-07-25 DIAGNOSIS — K219 Gastro-esophageal reflux disease without esophagitis: Secondary | ICD-10-CM | POA: Diagnosis not present

## 2020-07-25 DIAGNOSIS — I4892 Unspecified atrial flutter: Secondary | ICD-10-CM | POA: Diagnosis not present

## 2020-07-25 DIAGNOSIS — I4891 Unspecified atrial fibrillation: Secondary | ICD-10-CM | POA: Diagnosis not present

## 2020-07-25 DIAGNOSIS — F3189 Other bipolar disorder: Secondary | ICD-10-CM | POA: Diagnosis not present

## 2020-07-25 DIAGNOSIS — Z7901 Long term (current) use of anticoagulants: Secondary | ICD-10-CM | POA: Diagnosis not present

## 2020-07-25 DIAGNOSIS — M6281 Muscle weakness (generalized): Secondary | ICD-10-CM | POA: Diagnosis not present

## 2020-07-25 DIAGNOSIS — I608 Other nontraumatic subarachnoid hemorrhage: Secondary | ICD-10-CM | POA: Diagnosis not present

## 2020-07-25 DIAGNOSIS — I951 Orthostatic hypotension: Secondary | ICD-10-CM | POA: Diagnosis not present

## 2020-07-25 DIAGNOSIS — R2681 Unsteadiness on feet: Secondary | ICD-10-CM | POA: Diagnosis not present

## 2020-07-25 DIAGNOSIS — E1159 Type 2 diabetes mellitus with other circulatory complications: Secondary | ICD-10-CM | POA: Diagnosis not present

## 2020-07-25 LAB — BASIC METABOLIC PANEL
Anion gap: 7 (ref 5–15)
BUN: 20 mg/dL (ref 8–23)
CO2: 26 mmol/L (ref 22–32)
Calcium: 8.7 mg/dL — ABNORMAL LOW (ref 8.9–10.3)
Chloride: 104 mmol/L (ref 98–111)
Creatinine, Ser: 1.33 mg/dL — ABNORMAL HIGH (ref 0.44–1.00)
GFR, Estimated: 43 mL/min — ABNORMAL LOW (ref >60)
Glucose, Bld: 99 mg/dL (ref 70–99)
Potassium: 3.8 mmol/L (ref 3.5–5.1)
Sodium: 137 mmol/L (ref 135–145)

## 2020-07-25 LAB — PROTIME-INR
INR: 3.2 — ABNORMAL HIGH (ref 0.8–1.2)
Prothrombin Time: 31.7 seconds — ABNORMAL HIGH (ref 11.4–15.2)

## 2020-07-25 LAB — GLUCOSE, CAPILLARY
Glucose-Capillary: 132 mg/dL — ABNORMAL HIGH (ref 70–99)
Glucose-Capillary: 92 mg/dL (ref 70–99)

## 2020-07-25 MED ORDER — SACCHAROMYCES BOULARDII 250 MG PO CAPS: 250.0000 mg | ORAL_CAPSULE | Freq: Every day | ORAL | Status: DC

## 2020-07-25 MED ORDER — LAMOTRIGINE 150 MG PO TABS: 150.0000 mg | ORAL_TABLET | Freq: Two times a day (BID) | ORAL | Status: DC

## 2020-07-25 MED ORDER — CEFDINIR 300 MG PO CAPS: 300.0000 mg | ORAL_CAPSULE | Freq: Two times a day (BID) | ORAL | 0 refills | Status: AC

## 2020-07-25 MED ORDER — OXCARBAZEPINE 150 MG PO TABS: 150.0000 mg | ORAL_TABLET | Freq: Two times a day (BID) | ORAL | Status: DC

## 2020-07-25 MED ORDER — WARFARIN SODIUM 4 MG PO TABS
4.0000 mg | ORAL_TABLET | ORAL | Status: AC
  Administered 2020-07-25: 4 mg via ORAL
  Filled 2020-07-25: qty 1

## 2020-07-25 MED ORDER — WARFARIN 0.5 MG HALF TABLET
0.5000 mg | ORAL_TABLET | ORAL | Status: DC
  Filled 2020-07-25: qty 1

## 2020-07-25 MED ORDER — AMLODIPINE BESYLATE 5 MG PO TABS: 5.0000 mg | ORAL_TABLET | Freq: Every day | ORAL | Status: DC

## 2020-07-25 MED ORDER — SUMATRIPTAN SUCCINATE 50 MG PO TABS: 50.0000 mg | ORAL_TABLET | ORAL | 0 refills | Status: DC | PRN

## 2020-07-25 MED ORDER — METOPROLOL TARTRATE 12.5 MG HALF TABLET
12.5000 mg | ORAL_TABLET | Freq: Two times a day (BID) | ORAL | Status: DC
  Administered 2020-07-25: 12.5 mg via ORAL
  Filled 2020-07-25: qty 1

## 2020-07-25 MED ORDER — LAMOTRIGINE 100 MG PO TABS: 200.0000 mg | ORAL_TABLET | Freq: Every evening | ORAL | 2 refills | Status: DC

## 2020-07-25 MED ORDER — PANTOPRAZOLE SODIUM 40 MG PO TBEC: 40.0000 mg | DELAYED_RELEASE_TABLET | Freq: Every day | ORAL | Status: DC

## 2020-07-25 MED ORDER — METOPROLOL TARTRATE 25 MG PO TABS: 12.5000 mg | ORAL_TABLET | Freq: Two times a day (BID) | ORAL | Status: DC

## 2020-07-25 MED ORDER — FENOFIBRATE 160 MG PO TABS
160.0000 mg | ORAL_TABLET | Freq: Every day | ORAL | Status: AC
Start: 2020-07-25 — End: ?

## 2020-07-25 MED ORDER — DOCUSATE SODIUM 100 MG PO CAPS: 100.0000 mg | ORAL_CAPSULE | Freq: Two times a day (BID) | ORAL | 0 refills | Status: DC

## 2020-07-25 MED ORDER — LAMOTRIGINE 100 MG PO TABS: 100.0000 mg | ORAL_TABLET | Freq: Every morning | ORAL | 2 refills | Status: DC

## 2020-07-25 MED ORDER — VENLAFAXINE HCL ER 75 MG PO CP24: 75.0000 mg | ORAL_CAPSULE | Freq: Every day | ORAL | 2 refills | Status: DC

## 2020-07-25 MED ORDER — LEVOTHYROXINE SODIUM 25 MCG PO TABS: 25.0000 ug | ORAL_TABLET | Freq: Every day | ORAL | Status: DC

## 2020-07-25 NOTE — TOC Initial Note (Signed)
Transition of Care [facility]) - Initial/Assessment Note    Patient Details  Name: [name] MRN: [id number] Date of Birth: 05/22/1951  Transition of Care [facility]) CM/SW Contact:    [name], [place] Phone Number: 07/25/2020, 11:12 AM  Clinical Narrative:                  CSW visit with patient at bedside. CSW introduced self and explained role. Patient confirmed she was form [employer] and was agreeable to returning. Patient states her daughter was transporting her back to [place]. CSW was given permission to contact her daughter. CSW called patient's daughter,[name], she confirmed discharged plan. All questions answered.   CSW contacted [employer] informed of discharge today. SNF states they have received insurance authorization, which is good for 7 days.   Thurmond Butts, MSW, LCSW Clinical Social Worker     Barriers to Discharge: No Barriers Identified   Patient Goals and CMS Choice        Expected Discharge Plan and Services         Living arrangements for the past 2 months: [place] Expected Discharge Date: 07/25/20                                    Prior Living Arrangements/Services Living arrangements for the past 2 months: [place] Lives with:: Self Patient language and need for interpreter reviewed:: No        Need for Family Participation in Patient Care: Yes (Comment) Care giver support system in place?: Yes (comment)   Criminal Activity/Legal Involvement Pertinent to Current Situation/Hospitalization: No - Comment as needed  Activities of Daily Living      Permission Sought/Granted Permission sought to share information with : Family Supports Permission granted to share information with : Yes, Verbal Permission Granted  Share Information with NAME: [place] granted to share info w AGENCY: [place] granted to share info w Relationship: daughter  Permission granted to share  info w Contact Information: [phone]  Emotional Assessment Appearance:: Appears stated age Attitude/Demeanor/Rapport: Engaged Affect (typically observed): Accepting,Appropriate,Pleasant Orientation: : Oriented to Self,Oriented to Place,Oriented to  Time,Oriented to Situation Alcohol / Substance Use: Not Applicable Psych Involvement: No (comment)  Admission diagnosis:  Subarachnoid hematoma ([place]) [S06.6X9A] Traumatic hemorrhage of right cerebrum without loss of consciousness, initial encounter [facility]) [S06.340A] Patient Active Problem List   Diagnosis Date Noted  . Subarachnoid hematoma ([place]) 07/24/2020  . Acute cystitis without hematuria 07/24/2020  . Fall at home, initial encounter 07/24/2020  . Acute on chronic combined systolic and diastolic CHF (congestive heart failure) ([place]) 07/24/2020  . Essential hypertension 07/24/2020  . GERD without esophagitis 07/24/2020  . Atrial fibrillation and flutter ([place]) 07/24/2020  . Bipolar disorder ([place]) 07/24/2020  . Subdural hematoma ([place]) 07/24/2020  . Hypothyroidism 07/24/2020  . Acute renal failure superimposed on stage 3a chronic kidney disease ([place]) 07/24/2020  . History of mechanical aortic valve replacement 07/24/2020   PCP:  Mosie Lukes, MD Pharmacy:  No Pharmacies Listed    Social Determinants of Health (SDOH) Interventions    Readmission Risk Interventions No flowsheet data found.

## 2020-07-25 NOTE — Progress Notes (Signed)
Report attempted.  Katherina Right RN

## 2020-07-25 NOTE — TOC Transition Note (Signed)
Transition of Care [facility]) - CM/SW Discharge Note   Patient Details  Name: [name] MRN: [id number] Date of Birth: 19-Apr-1951  Transition of Care [facility]) CM/SW Contact:  Vinie Sill, [place] Phone Number: 07/25/2020, 11:20 AM   Clinical Narrative:     Patient will Discharge to: [place]  Discharge Date: 07/25/20 Family Notified: Amy,daughter Transport By: Daughter will transport    Per MD patient is ready for discharge. RN, patient, and facility notified of DC. Discharge Summary sent to facility. RN given number for [phone], Room 308-P.  Ambulance transport requested for patient.   Clinical Social Worker signing off.  Thurmond Butts, MSW, LCSW Clinical Social Worker   Final next level of care: Skilled Nursing Facility Barriers to Discharge: No Barriers Identified   Patient Goals and CMS Choice        Discharge Placement                       Discharge Plan and Services                                     Social Determinants of Health (SDOH) Interventions     Readmission Risk Interventions No flowsheet data found.

## 2020-07-25 NOTE — Progress Notes (Addendum)
ANTICOAGULATION CONSULT NOTE  Pharmacy Consult:  Coumadin Indication:   History of mechanical AVR and Afib  Allergies  Allergen Reactions  . Keflex [Cephalexin] Other (See Comments)    Headaches and/or dizziness- "allergic," per MAR  . Mucinex [Guaifenesin Er] Other (See Comments)    Severe headaches- "allergic," per MAR  . Sulfa Antibiotics Other (See Comments)    Headaches- "allergic," per [facility]     Patient Measurements: Height: 5\' 8"  (172.7 cm) Weight: 86.6 kg (191 lb) IBW/kg (Calculated) : 63.9  Vital Signs: Temp: 98.7 F (37.1 C) (03/02 1128) Temp Source: Oral (03/02 1128) BP: 152/59 (03/02 1128) Pulse Rate: 89 (03/02 1128)  Labs: Recent Labs    07/23/20 2152 07/23/20 2215 07/24/20 0358 07/25/20 0339  HGB 11.3*  --  9.5*  --   HCT 35.3*  --  28.6*  --   PLT 243  --  207  --   APTT  --   --  43*  --   LABPROT  --  26.1* 28.7* 31.7*  INR  --  2.5* 2.8* 3.2*  CREATININE 1.94*  --  1.51* 1.33*  TROPONINIHS 16  --   --   --     Estimated Creatinine Clearance: 46 mL/min (A) (by C-G formula based on SCr of 1.33 mg/dL (H)).   Assessment: 89 YOF presented s/p fall.  Patient has a history of mechanical AVR and Afib on Coumadin 5mg  PO daily except 2.5mg  on Mon/Fri PTA per patient.  She also has a recent left subdural hematoma requiring crani with evacuation.  CT here showed stable subdural hematoma with improving SAH that nearly resolved.  Pharmacy consulted to continue Coumadin from PTA.  INR therapeutic; no bleeding reported.  Goal of Therapy:  INR 2.5 - 3.5 per patient  Monitor platelets by anticoagulation protocol: Yes   Plan:  Coumadin 4mg  PO today, give prior to discharge.  RN and patient aware. Daily PT / INR  Idaly Verret D. Mina Marble, PharmD, BCPS, [place] 07/25/2020, 2:31 PM

## 2020-07-25 NOTE — Care Management Obs Status (Signed)
[place] NOTIFICATION   Patient Details  Name: [name] MRN: [id number] Date of Birth: Nov 22, 1950   Medicare Observation Status Notification Given:  Yes    Carles Collet, RN 07/25/2020, 12:14 PM

## 2020-07-25 NOTE — Progress Notes (Addendum)
Providing Compassionate, Quality Care - Together   Subjective: Patient reports she is "ready to get out of here." I spoke with daughter by telephone. She expressed concern that the hospital in [facility] started her mother back on medications that she had been weaned off of and didn't order medications that she had been taking.  Objective: Vital signs in last 24 hours: Temp:  [98 F (36.7 C)-98.3 F (36.8 C)] 98.1 F (36.7 C) (03/02 0748) Pulse Rate:  [60-69] 60 (03/02 0748) Resp:  [13-17] 17 (03/02 0748) BP: (97-129)/(50-84) 127/58 (03/02 0748) SpO2:  [96 %-100 %] 99 % (03/02 0748)  Intake/Output from previous day: 03/01 0701 - 03/02 0700 In: 640 [P.O.:640] Out: -  Intake/Output this shift: No intake/output data recorded.  Alert and oriented Speech clear, fluent MAE, Strength and sensation intact Old craniotomy site is well-healed  Lab Results: Recent Labs    07/23/20 2152 07/24/20 0358  WBC 7.0 6.8  HGB 11.3* 9.5*  HCT 35.3* 28.6*  PLT 243 207   BMET Recent Labs    07/24/20 0358 07/25/20 0339  NA 139 137  K 3.6 3.8  CL 103 104  CO2 27 26  GLUCOSE 102* 99  BUN 29* 20  CREATININE 1.51* 1.33*  CALCIUM 9.1 8.7*    Studies/Results: CT HEAD WO CONTRAST  Result Date: 07/24/2020 CLINICAL DATA:  Subarachnoid hemorrhage EXAM: CT HEAD WITHOUT CONTRAST TECHNIQUE: Contiguous axial images were obtained from the base of the skull through the vertex without intravenous contrast. COMPARISON:  07/23/2020 FINDINGS: Brain: Left frontotemporal craniotomy has been performed. Stable small left subdural fluid and punctate foci of gas subjacent to the craniotomy flap. No significant associated mass effect. Stable small right subdural hematoma measuring 11-12 mm thick. Minimal mass effect upon the right cerebral hemisphere. No midline shift. Trace subarachnoid hemorrhage superficial to the anterior pole of the right temporal lobe has improved in the interval since prior examination,  nearly resolved. No interval hemorrhage. Ventricular size is normal. Cerebellum is unremarkable. Vascular: No asymmetric hyperdense vasculature at the skull base Skull: Otherwise intact Sinuses/Orbits: The visualized paranasal sinuses are clear. The orbits are unremarkable. Other: None IMPRESSION: Stable right subdural hematoma with mild mass effect upon the right cerebral hemisphere. Improving right trace subarachnoid hemorrhage, now nearly resolved. Surgical changes of left frontotemporal craniotomy are again identified with stable fluid and punctate foci of gas subjacent to the craniotomy flap. Electronically Signed   By: Fidela Salisbury MD   On: 07/24/2020 04:57   CT Head Wo Contrast  Result Date: 07/23/2020 CLINICAL DATA:  Head trauma EXAM: CT HEAD WITHOUT CONTRAST TECHNIQUE: Contiguous axial images were obtained from the base of the skull through the vertex without intravenous contrast. COMPARISON:  06/13/2020 FINDINGS: Brain: There is a small amount of acute subarachnoid hemorrhage along the right temporal lobe, reference images 12 and 13. Complex low-attenuation fluid along the right parietal convexity consistent with chronic subdural hematoma. On coronal reconstructed images there are thin areas of increased attenuation along the periphery which could reflect superimposed acute hemorrhage. Sequela from previous left frontotemporal craniotomy, with dural calcifications along the surgical margins. No acute infarct. Lateral ventricles and midline structures are unremarkable. No mass effect. Vascular: No hyperdense vessel or unexpected calcification. Skull: No acute bony abnormalities. Sinuses/Orbits: No acute finding. Other: None. IMPRESSION: 1. Trace acute subarachnoid hemorrhage along the right temporal lobe. 2. Minimal acute on chronic right parietal convexity subdural hematoma. No mass effect. 3. Postsurgical changes from left frontotemporal craniotomy, with underlying dural calcifications.  Critical  Value/emergent results were called by telephone at the time of interpretation on 07/23/2020 at 10:25 pm to provider DAN FLOYD , who verbally acknowledged these results. Electronically Signed   By: Randa Ngo M.D.   On: 07/23/2020 22:25   DG Chest Port 1 View  Result Date: 07/23/2020 CLINICAL DATA:  Fall.  Patient was started on warfarin today. EXAM: PORTABLE CHEST 1 VIEW COMPARISON:  04/29/2020 FINDINGS: Patient's LEFT-sided transvenous pacemaker with leads to the RIGHT atrium and RIGHT ventricle. Median sternotomy and valve replacement. Heart size is mildly enlarged, stable. No focal consolidations or pleural effusions. No pulmonary edema. No pneumothorax or acute fracture. Surgical clips overlie the RIGHT axillary region consistent with previous axillary node dissection. IMPRESSION: 1. Stable cardiomegaly. 2. No evidence for acute abnormality. Electronically Signed   By: Nolon Nations M.D.   On: 07/23/2020 22:02    Assessment/Plan: Patient with subacute right-sided SDH and small amount of SAH. She is approximately one month status post left frontotemporal craniotomy for SDH evacuation. Her follow-up imaging on 07/24/2020 demonstrated stable appearance of SDH. She must remain on Coumadin due to her mechanical valve and atrial fibrillation. There is no Neurosurgical intervention recommended at this time. Re-imaging only necessary if there is a decline in the patient's mental status.    LOS: 0 days    -Discussed situation with patient's daughter, Amy, and relayed the information regarding the patient's medications to Dr. [place] -Patient can follow up as an outpatient with Dr. Annette Stable. The patient is ok to discharge from a neurosurgical perspective once the primary team feels she is medically stable.    Viona Gilmore, DNP, AGNP-C Nurse Practitioner  [facility] [place] [address], [place], [place] [address] P: [phone]    F: [phone]  07/25/2020,  9:19 AM

## 2020-07-25 NOTE — Progress Notes (Signed)
SLP Cancellation Note  Patient Details Name: [name] MRN: [id number] DOB: June 22, 1950   Cancelled Eval - Pt discharging to [facility] today. Recommend defer evaluation at acute care venue, with facility SLP to evaluate and establish appropriate Plan of treatment at next level of care. MD in agreement.   Evalyne Cortopassi B. Quentin Ore, [facility], [place] Speech Language Pathologist Office: [phone] Pager: [phone]  Shonna Chock 07/25/2020, 11:44 AM

## 2020-07-25 NOTE — Discharge Summary (Signed)
Physician Discharge Summary  [name] CBS:[id number] DOB: [birth year] DOA: 07/23/2020  PCP: Mosie Lukes, MD  Admit date: 07/23/2020 Discharge date: 07/25/2020  Admitted From: [place] Disposition:  [facility]  Recommendations for Outpatient Follow-up:  1. Follow up with PCP in 1-2 weeks 2. Follow up with Neurosurgery, Dr. Annette Stable in 2-3 weeks 3. Decrease metoprolol tartrate to 12.5 mg p.o. twice daily 4. Discontinued amlodipine, amiodarone 5. Discontinued ciprofloxacin in favor of cefdinir 300 mg p.o. twice daily to complete 3-day course for UTI 6. Discontinued Keppra in favor of Trileptal 150mg  BID 7. Continue to monitor BP closely and adjust as needed; closely monitor for orthostasis; please discuss with patient's cardiologist, Dr. Tamala Julian before any changes 8. Check TSH/free T4 in 2-3 weeks  Discharge Condition: Stable CODE STATUS: DNR Diet recommendation:   History of present illness:  [name] is a 70 year old female with past medical history significant for atrial fibrillation/flutter, non-insulin-dependent diabetes mellitus, migraine headaches, essential hypertension, hyperlipidemia, gout, bipolar disorder, history of mechanical aortic valve replacement 2009 on chronic anticoagulation with Coumadin and recent large left subdural hematoma requiring craniotomy and evacuation who [facility] from SNF following fall.  Patient states that she was sitting in her wheelchair and attempted to stand up in which she lost her balance and fell.  Patient denies loss of consciousness lightheadedness or vertigo that precipitated the fall.  No seizure-like activity, loss of bowel/bladder function, or tongue biting was appreciated by staff.  Patient struck the left side of her head on the ground and EMS was activated.  Patient recently hospitalized at [facility] in [place] from 2/3 until 2/28 status post fall while visiting her son.   During that hospitalization she was found to have a large left subdural hematoma, subarachnoid hemorrhage and some events of cerebral edema requiring emergent intubation, emergent reversal of her anticoagulant and emergent neurosurgical intervention with craniotomy and evacuation of the bleed.  Patient was in the intensive care unit for the majority of her hospital stay and eventually discharged to [facility] on 07/23/2020.  In the ED, temperature 98.6 F, HR 60, RR 161/65, SPO2 97% on room air.  Sodium 136, potassium 4.2, chloride 99, CO2 25, glucose 110, BUN 36, creatinine 1.94, AST 60, ALT 39, total bilirubin 0.9.  WBC 7.0, hemoglobin 11.3, platelets 243.  SARS-CoV-2/Covid-19 negative. Chest x-ray with stable cardiomegaly, no evidence of acute cardiopulmonary disease process.  CT head without contrast with trace acute subarachnoid hemorrhage along the right temporal lobe, minimal acute on chronic right parietal, convexity subdural hematoma without mass-effect and postsurgical changes from left frontotemporal craniotomy with underlying dural calcifications.  Case was discussed by EDP with neurosurgery, Dr. Annette Stable who recommended admission for observation and serial noncontrast CT of the head in 6 hours.  Hospitalist service consulted for further evaluation and management.  Hospital course:  Subarachnoid hematoma Subdural hematoma Patient presenting to the ED from SNF following fall and striking the left side of her head.  CT head on arrival to the ED revealing trace acute subarachnoid hemorrhage of the right temporal lobe in addition to minimal acute on chronic right parietal subdural hematoma.  Recent prolonged hospitalization in [facility] following fall with subsequent SDH/SAH.  Complicated by history of chronic anticoagulant use with Coumadin due to her mechanical aortic valve.   Repeat follow-up CT Head with stable right subdural hematoma with mild mass-effect upon the right cerebral hemisphere  and improving right trace  subarachnoid hemorrhage now nearly resolved. Seen by neurosurgery this a.m., no further imaging required unless a decline in patient's mental status and okay for follow-up outpatient with Dr. Annette Stable. Continue Lamictal and Trileptal  Orthostatic hypotension While working with OT this morning, notable drop in blood pressure in seated position 114/54 to 62/50 in the standing position with associated dizziness.  Question orthostasis causing falls leading to SAH/SDH as above; with fall once again while transitioning from wheelchair at SNF yesterday.  Patient is currently on metoprolol tartrate 25 mg PO q8h and amlodipine 10 mg p.o. daily.  Patient was given 1 L NS bolus with improvement of blood pressure.  Antihypertensives were reduced, metoprolol to 12.5 mg every 12 hours.  Discontinued amlodipine, no longer on per cardiology.  Essential hypertension. Patient currently on amlodipine 10 mg p.o. daily, metoprolol tartrate 25 mg p.o. every 8 hours, and furosemide 40 mg p.o. daily.  Patient with significant orthostatic hypotension as above.  Discontinued amlodipine and reduced metoprolol tartrate to 12.5 mg p.o. every 12 hours  Acute renal failure on CKD stage IIIa Baseline creatinine 1.2-1.4.  Presenting with creatinine of 1.94.  Creatinine improved to 1.33 at time of discharge.  Paroxysmal atrial fibrillation/flutter On Coumadin for anticoagulation, rate control with metoprolol 12.5 mg p.o. BID.  Outpatient follow-up with cardiology, Dr. Tamala Julian.  Mechanical aortic valve INR 3.2 this morning.  Continue to monitor INR.  Continue home Coumadin.  Type 2 diabetes mellitus Managed with insulin sliding scale outpatient.  Urinary tract infection Patient was being treated with ciprofloxacin outpatient.  Urinalysis with large leukoesterase, negative nitrite, few bacteria and 21-50 WBCs.  Given her SAH/SDH on Keppra, pharmacy recommended transitioning to cephalosporin. Continue  cefdinir 300 mg p.o. twice daily to complete 3-day course  Hyperlipidemia: Crestor 40 mg p.o. daily  Bipolar disorder: Continue Lamictal 100 mg p.o. qAM and 200mg  PO qPM.  Seroquel 25 mg p.o. nightly.  Hypothyroidism: Levothyroxine 25 mcg p.o. daily.  Check TSH/free T4 in 2-3 weeks.  History of migraine headaches: Continue sumatriptan 50 mg every 2 hours as needed  GERD: Continue PPI  Discharge Diagnoses:  Principal Problem:   Subarachnoid hematoma ([place]) Active Problems:   Acute cystitis without hematuria   Fall at home, initial encounter   Acute on chronic combined systolic and diastolic CHF (congestive heart failure) (HCC)   Essential hypertension   GERD without esophagitis   Atrial fibrillation and flutter (HCC)   Bipolar disorder (HCC)   Subdural hematoma (HCC)   Hypothyroidism   Acute renal failure superimposed on stage 3a chronic kidney disease (HCC)   History of mechanical aortic valve replacement    Discharge Instructions  Discharge Instructions    Call MD for:  difficulty breathing, headache or visual disturbances   Complete by: As directed    Call MD for:  extreme fatigue   Complete by: As directed    Call MD for:  persistant dizziness or light-headedness   Complete by: As directed    Call MD for:  persistant nausea and vomiting   Complete by: As directed    Call MD for:  severe uncontrolled pain   Complete by: As directed    Call MD for:  temperature >100.4   Complete by: As directed    Diet - low sodium heart healthy   Complete by: As directed    Increase activity slowly   Complete by: As directed      Allergies as of 07/25/2020      Reactions   Keflex [  cephalexin] Other (See Comments)   Headaches and/or dizziness- "allergic," per [facility]   Mucinex [guaifenesin Er] Other (See Comments)   Severe headaches- "allergic," per [facility]   Sulfa Antibiotics Other (See Comments)   Headaches- "allergic," per [facility]      Medication List    STOP taking these  medications   amiodarone 200 MG tablet Commonly known as: PACERONE   amLODipine 10 MG tablet Commonly known as: NORVASC   ciprofloxacin 250 MG tablet Commonly known as: CIPRO   levETIRAcetam 500 MG tablet Commonly known as: KEPPRA     TAKE these medications   acetaminophen 325 MG tablet Commonly known as: TYLENOL Take 325 mg by mouth every 4 (four) hours as needed for mild pain.   cefdinir 300 MG capsule Commonly known as: OMNICEF Take 1 capsule (300 mg total) by mouth 2 (two) times daily for 2 days.   docusate sodium 100 MG capsule Commonly known as: COLACE Take 1 capsule (100 mg total) by mouth 2 (two) times daily.   fenofibrate 160 MG tablet Take 1 tablet (160 mg total) by mouth daily.   insulin aspart 100 UNIT/ML injection Commonly known as: novoLOG Inject 0-12 Units into the skin See admin instructions. Inject 0-12 units into the skin three times a day before meals, per sliding scale: BGL <70 = CALL MD; 70-200 = give nothing; 201-250 = 2 units; 251-300 = 4 units; 301-350 = 6 units; 351-400 = 8 units; 401-450 = 10 units; 451-600 = 12 units; >450 = 12 units and re-check in two hours- call MD if still elevated   lamoTRIgine 100 MG tablet Commonly known as: LaMICtal Take 1 tablet (100 mg total) by mouth in the morning. What changed: when to take this   lamoTRIgine 100 MG tablet Commonly known as: LaMICtal Take 2 tablets (200 mg total) by mouth every evening. What changed: You were already taking a medication with the same name, and this prescription was added. Make sure you understand how and when to take each.   levothyroxine 25 MCG tablet Commonly known as: SYNTHROID Take 1 tablet (25 mcg total) by mouth daily at 6 (six) AM. Start taking on: July 26, 2020   metoprolol tartrate 25 MG tablet Commonly known as: LOPRESSOR Take 0.5 tablets (12.5 mg total) by mouth 2 (two) times daily. What changed:   how much to take  when to take this   OXcarbazepine 150 MG  tablet Commonly known as: Trileptal Take 1 tablet (150 mg total) by mouth 2 (two) times daily.   pantoprazole 40 MG tablet Commonly known as: PROTONIX Take 1 tablet (40 mg total) by mouth daily.   polyvinyl alcohol 1.4 % ophthalmic solution Commonly known as: LIQUIFILM TEARS Place 1 drop into both eyes every 4 (four) hours as needed for dry eyes.   QUEtiapine 25 MG tablet Commonly known as: SEROQUEL Take 25 mg by mouth at bedtime.   rosuvastatin 40 MG tablet Commonly known as: CRESTOR Take 40 mg by mouth daily.   saccharomyces boulardii 250 MG capsule Commonly known as: FLORASTOR Take 1 capsule (250 mg total) by mouth daily.   SUMAtriptan 50 MG tablet Commonly known as: IMITREX Take 1 tablet (50 mg total) by mouth every 2 (two) hours as needed for migraine. May repeat in 2 hours if headache persists or recurs.   venlafaxine XR 75 MG 24 hr capsule Commonly known as: Effexor XR Take 1 capsule (75 mg total) by mouth daily.   warfarin 3 MG tablet Commonly known  as: COUMADIN Take 3 mg by mouth daily.       Follow-up Information    Mosie Lukes, MD. Schedule an appointment as soon as possible for a visit in 1 week(s).   Specialty: Family Medicine Contact information: [place] [address] [place] [address] [phone]        Earnie Larsson, MD. Schedule an appointment as soon as possible for a visit in 2 week(s).   Specialty: Neurosurgery Contact information: [address] [place] [address] [phone]              Allergies  Allergen Reactions  . Keflex [Cephalexin] Other (See Comments)    Headaches and/or dizziness- "allergic," per [facility]  . Mucinex [Guaifenesin Er] Other (See Comments)    Severe headaches- "allergic," per [facility]  . Sulfa Antibiotics Other (See Comments)    Headaches- "allergic," per [facility]     Consultations:  Neurosurgery, Dr. Annette Stable   Procedures/Studies: CT HEAD WO CONTRAST  Result Date:  07/24/2020 CLINICAL DATA:  Subarachnoid hemorrhage EXAM: CT HEAD WITHOUT CONTRAST TECHNIQUE: Contiguous axial images were obtained from the base of the skull through the vertex without intravenous contrast. COMPARISON:  07/23/2020 FINDINGS: Brain: Left frontotemporal craniotomy has been performed. Stable small left subdural fluid and punctate foci of gas subjacent to the craniotomy flap. No significant associated mass effect. Stable small right subdural hematoma measuring 11-12 mm thick. Minimal mass effect upon the right cerebral hemisphere. No midline shift. Trace subarachnoid hemorrhage superficial to the anterior pole of the right temporal lobe has improved in the interval since prior examination, nearly resolved. No interval hemorrhage. Ventricular size is normal. Cerebellum is unremarkable. Vascular: No asymmetric hyperdense vasculature at the skull base Skull: Otherwise intact Sinuses/Orbits: The visualized paranasal sinuses are clear. The orbits are unremarkable. Other: None IMPRESSION: Stable right subdural hematoma with mild mass effect upon the right cerebral hemisphere. Improving right trace subarachnoid hemorrhage, now nearly resolved. Surgical changes of left frontotemporal craniotomy are again identified with stable fluid and punctate foci of gas subjacent to the craniotomy flap. Electronically Signed   By: Fidela Salisbury MD   On: 07/24/2020 04:57   CT Head Wo Contrast  Result Date: 07/23/2020 CLINICAL DATA:  Head trauma EXAM: CT HEAD WITHOUT CONTRAST TECHNIQUE: Contiguous axial images were obtained from the base of the skull through the vertex without intravenous contrast. COMPARISON:  06/13/2020 FINDINGS: Brain: There is a small amount of acute subarachnoid hemorrhage along the right temporal lobe, reference images 12 and 13. Complex low-attenuation fluid along the right parietal convexity consistent with chronic subdural hematoma. On coronal reconstructed images there are thin areas of increased  attenuation along the periphery which could reflect superimposed acute hemorrhage. Sequela from previous left frontotemporal craniotomy, with dural calcifications along the surgical margins. No acute infarct. Lateral ventricles and midline structures are unremarkable. No mass effect. Vascular: No hyperdense vessel or unexpected calcification. Skull: No acute bony abnormalities. Sinuses/Orbits: No acute finding. Other: None. IMPRESSION: 1. Trace acute subarachnoid hemorrhage along the right temporal lobe. 2. Minimal acute on chronic right parietal convexity subdural hematoma. No mass effect. 3. Postsurgical changes from left frontotemporal craniotomy, with underlying dural calcifications. Critical Value/emergent results were called by telephone at the time of interpretation on 07/23/2020 at 10:25 pm to provider DAN FLOYD , who verbally acknowledged these results. Electronically Signed   By: Randa Ngo M.D.   On: 07/23/2020 22:25   DG Chest Port 1 View  Result Date: 07/23/2020 CLINICAL  DATA:  Fall.  Patient was started on warfarin today. EXAM: PORTABLE CHEST 1 VIEW COMPARISON:  04/29/2020 FINDINGS: Patient's LEFT-sided transvenous pacemaker with leads to the RIGHT atrium and RIGHT ventricle. Median sternotomy and valve replacement. Heart size is mildly enlarged, stable. No focal consolidations or pleural effusions. No pulmonary edema. No pneumothorax or acute fracture. Surgical clips overlie the RIGHT axillary region consistent with previous axillary node dissection. IMPRESSION: 1. Stable cardiomegaly. 2. No evidence for acute abnormality. Electronically Signed   By: Nolon Nations M.D.   On: 07/23/2020 22:02     Subjective: Patient seen and examined bedside, resting comfortably.  No specific complaints this morning.  Dizziness resolved.  Ready for discharge back to SNF.  Updated patient's daughter, Amy via telephone.  Denies headache, no visual changes, no chest pain, palpitations, no shortness of  breath, no abdominal pain, no weakness, no fatigue, no paresthesias.  No acute events overnight per nursing staff.  Discharge Exam: Vitals:   07/25/20 0400 07/25/20 0748  BP:  (!) 127/58  Pulse: 60 60  Resp: 15 17  Temp:  98.1 F (36.7 C)  SpO2: 99% 99%   Vitals:   07/24/20 2343 07/25/20 0324 07/25/20 0400 07/25/20 0748  BP: 111/83 117/71  (!) 127/58  Pulse: 60  60 60  Resp: 13 17 15 17   Temp: 98.3 F (36.8 C) 98.1 F (36.7 C)  98.1 F (36.7 C)  TempSrc: Oral Oral  Oral  SpO2: 99%  99% 99%  Weight:      Height:        General: Pt is alert, awake, not in acute distress HEENT: Noted left periorbital ecchymosis noted previous surgical incisions/craniotomy site to the left Cardiovascular: RRR, S1/S2 +, no rubs, no gallops Respiratory: CTA bilaterally, no wheezing, no rhonchi, oxygenating well on room air Abdominal: Soft, NT, ND, bowel sounds + Extremities: no edema, no cyanosis    The results of significant diagnostics from this hospitalization (including imaging, microbiology, ancillary and laboratory) are listed below for reference.     Microbiology: Recent Results (from the past 240 hour(s))  SARS CORONAVIRUS 2 (TAT 6-24 HRS) Nasopharyngeal Nasopharyngeal Swab     Status: None   Collection Time: 07/24/20 12:30 AM   Specimen: Nasopharyngeal Swab  Result Value Ref Range Status   SARS Coronavirus 2 NEGATIVE NEGATIVE Final    Comment: (NOTE) SARS-CoV-2 target nucleic acids are NOT DETECTED.  The SARS-CoV-2 RNA is generally detectable in upper and lower respiratory specimens during the acute phase of infection. Negative results do not preclude SARS-CoV-2 infection, do not rule out co-infections with other pathogens, and should not be used as the sole basis for treatment or other patient management decisions. Negative results must be combined with clinical observations, patient history, and epidemiological information. The expected result is Negative.  Fact Sheet  for Patients: SugarRoll.be  Fact Sheet for Healthcare Providers: https://www.woods-mathews.com/  This test is not yet approved or cleared by the [place] FDA and  has been authorized for detection and/or diagnosis of SARS-CoV-2 by FDA under an Emergency Use Authorization (EUA). This EUA will remain  in effect (meaning this test can be used) for the duration of the COVID-19 declaration under Se ction 564(b)(1) of the Act, 21 U.S.C. section 360bbb-3(b)(1), unless the authorization is terminated or revoked sooner.  Performed at [facility], [place] [address] [place], [place] [address]      Labs: BNP (last 3 results) No results for input(s): BNP in the last 8760 hours. Basic  Metabolic Panel: Recent Labs  Lab 07/23/20 2152 07/24/20 0358 07/25/20 0339  NA 136 139 137  K 4.2 3.6 3.8  CL 99 103 104  CO2 25 27 26   GLUCOSE 110* 102* 99  BUN 36* 29* 20  CREATININE 1.94* 1.51* 1.33*  CALCIUM 9.7 9.1 8.7*   Liver Function Tests: Recent Labs  Lab 07/23/20 2152 07/24/20 0358  AST 60* 42*  ALT 39 32  ALKPHOS 36* 32*  BILITOT 0.9 0.8  PROT 7.8 6.1*  ALBUMIN 4.1 3.3*   No results for input(s): LIPASE, AMYLASE in the last 168 hours. No results for input(s): AMMONIA in the last 168 hours. CBC: Recent Labs  Lab 07/23/20 2152 07/24/20 0358  WBC 7.0 6.8  NEUTROABS 4.3  --   HGB 11.3* 9.5*  HCT 35.3* 28.6*  MCV 97.5 95.3  PLT 243 207   Cardiac Enzymes: No results for input(s): CKTOTAL, CKMB, CKMBINDEX, TROPONINI in the last 168 hours. BNP: Invalid input(s): POCBNP CBG: Recent Labs  Lab 07/24/20 0429 07/24/20 0850 07/24/20 1217 07/24/20 2137 07/25/20 0751  GLUCAP 92 174* 146* 110* 132*   D-Dimer No results for input(s): DDIMER in the last 72 hours. Hgb A1c Recent Labs    07/24/20 0358  HGBA1C 4.8   Lipid Profile Recent Labs    07/24/20 0358  CHOL 147  HDL 62  LDLCALC 60  TRIG 124  CHOLHDL 2.4    Thyroid function studies No results for input(s): TSH, T4TOTAL, T3FREE, THYROIDAB in the last 72 hours.  Invalid input(s): FREET3 Anemia work up No results for input(s): VITAMINB12, FOLATE, FERRITIN, TIBC, IRON, RETICCTPCT in the last 72 hours. Urinalysis    Component Value Date/Time   COLORURINE YELLOW 07/24/2020 0108   APPEARANCEUR CLOUDY (A) 07/24/2020 0108   LABSPEC 1.008 07/24/2020 0108   PHURINE 5.0 07/24/2020 0108   GLUCOSEU NEGATIVE 07/24/2020 0108   HGBUR SMALL (A) 07/24/2020 0108   BILIRUBINUR NEGATIVE 07/24/2020 0108   KETONESUR NEGATIVE 07/24/2020 0108   PROTEINUR NEGATIVE 07/24/2020 0108   NITRITE NEGATIVE 07/24/2020 0108   LEUKOCYTESUR LARGE (A) 07/24/2020 0108   Sepsis Labs Invalid input(s): PROCALCITONIN,  WBC,  LACTICIDVEN Microbiology Recent Results (from the past 240 hour(s))  SARS CORONAVIRUS 2 (TAT 6-24 HRS) Nasopharyngeal Nasopharyngeal Swab     Status: None   Collection Time: 07/24/20 12:30 AM   Specimen: Nasopharyngeal Swab  Result Value Ref Range Status   SARS Coronavirus 2 NEGATIVE NEGATIVE Final    Comment: (NOTE) SARS-CoV-2 target nucleic acids are NOT DETECTED.  The SARS-CoV-2 RNA is generally detectable in upper and lower respiratory specimens during the acute phase of infection. Negative results do not preclude SARS-CoV-2 infection, do not rule out co-infections with other pathogens, and should not be used as the sole basis for treatment or other patient management decisions. Negative results must be combined with clinical observations, patient history, and epidemiological information. The expected result is Negative.  Fact Sheet for Patients: SugarRoll.be  Fact Sheet for Healthcare Providers: https://www.woods-mathews.com/  This test is not yet approved or cleared by the [place] FDA and  has been authorized for detection and/or diagnosis of SARS-CoV-2 by FDA under an Emergency Use  Authorization (EUA). This EUA will remain  in effect (meaning this test can be used) for the duration of the COVID-19 declaration under Se ction 564(b)(1) of the Act, 21 U.S.C. section 360bbb-3(b)(1), unless the authorization is terminated or revoked sooner.  Performed at [facility], [place] [address] [place], [place] [address]  Time coordinating discharge: Over 30 minutes  SIGNED:   Donnamarie Poag [place] DO  [facility] 07/25/2020, 11:01 AM

## 2020-07-26 ENCOUNTER — Encounter (HOSPITAL_BASED_OUTPATIENT_CLINIC_OR_DEPARTMENT_OTHER): Payer: Self-pay | Admitting: Emergency Medicine

## 2020-07-26 DIAGNOSIS — I480 Paroxysmal atrial fibrillation: Secondary | ICD-10-CM | POA: Diagnosis not present

## 2020-07-26 DIAGNOSIS — I608 Other nontraumatic subarachnoid hemorrhage: Secondary | ICD-10-CM | POA: Diagnosis not present

## 2020-07-26 DIAGNOSIS — S065X9 Traumatic subdural hemorrhage with loss of consciousness of unspecified duration: Secondary | ICD-10-CM | POA: Diagnosis not present

## 2020-07-26 DIAGNOSIS — I951 Orthostatic hypotension: Secondary | ICD-10-CM | POA: Diagnosis not present

## 2020-07-26 DIAGNOSIS — G936 Cerebral edema: Secondary | ICD-10-CM | POA: Diagnosis not present

## 2020-07-26 DIAGNOSIS — I152 Hypertension secondary to endocrine disorders: Secondary | ICD-10-CM | POA: Diagnosis not present

## 2020-07-26 DIAGNOSIS — E1159 Type 2 diabetes mellitus with other circulatory complications: Secondary | ICD-10-CM | POA: Diagnosis not present

## 2020-07-26 DIAGNOSIS — R2689 Other abnormalities of gait and mobility: Secondary | ICD-10-CM | POA: Diagnosis not present

## 2020-07-26 DIAGNOSIS — N1831 Chronic kidney disease, stage 3a: Secondary | ICD-10-CM | POA: Diagnosis not present

## 2020-07-26 DIAGNOSIS — J9601 Acute respiratory failure with hypoxia: Secondary | ICD-10-CM | POA: Diagnosis not present

## 2020-07-26 DIAGNOSIS — F3189 Other bipolar disorder: Secondary | ICD-10-CM | POA: Diagnosis not present

## 2020-07-26 DIAGNOSIS — S065X9A Traumatic subdural hemorrhage with loss of consciousness of unspecified duration, initial encounter: Secondary | ICD-10-CM | POA: Diagnosis not present

## 2020-07-30 DIAGNOSIS — Z952 Presence of prosthetic heart valve: Secondary | ICD-10-CM | POA: Diagnosis not present

## 2020-07-30 DIAGNOSIS — E039 Hypothyroidism, unspecified: Secondary | ICD-10-CM | POA: Diagnosis not present

## 2020-07-30 DIAGNOSIS — F319 Bipolar disorder, unspecified: Secondary | ICD-10-CM | POA: Diagnosis not present

## 2020-07-30 DIAGNOSIS — M6281 Muscle weakness (generalized): Secondary | ICD-10-CM | POA: Diagnosis not present

## 2020-07-30 DIAGNOSIS — K219 Gastro-esophageal reflux disease without esophagitis: Secondary | ICD-10-CM | POA: Diagnosis not present

## 2020-07-30 DIAGNOSIS — S066X9D Traumatic subarachnoid hemorrhage with loss of consciousness of unspecified duration, subsequent encounter: Secondary | ICD-10-CM | POA: Diagnosis not present

## 2020-07-30 DIAGNOSIS — I480 Paroxysmal atrial fibrillation: Secondary | ICD-10-CM | POA: Diagnosis not present

## 2020-07-30 DIAGNOSIS — E785 Hyperlipidemia, unspecified: Secondary | ICD-10-CM | POA: Diagnosis not present

## 2020-07-30 DIAGNOSIS — R2681 Unsteadiness on feet: Secondary | ICD-10-CM | POA: Diagnosis not present

## 2020-07-30 DIAGNOSIS — I509 Heart failure, unspecified: Secondary | ICD-10-CM | POA: Diagnosis not present

## 2020-07-30 DIAGNOSIS — N183 Chronic kidney disease, stage 3 unspecified: Secondary | ICD-10-CM | POA: Diagnosis not present

## 2020-07-30 DIAGNOSIS — I10 Essential (primary) hypertension: Secondary | ICD-10-CM | POA: Diagnosis not present

## 2020-08-02 ENCOUNTER — Telehealth: Payer: Self-pay | Admitting: Interventional Cardiology

## 2020-08-02 DIAGNOSIS — S065X9A Traumatic subdural hemorrhage with loss of consciousness of unspecified duration, initial encounter: Secondary | ICD-10-CM | POA: Diagnosis not present

## 2020-08-02 DIAGNOSIS — I480 Paroxysmal atrial fibrillation: Secondary | ICD-10-CM | POA: Diagnosis not present

## 2020-08-02 DIAGNOSIS — Z7901 Long term (current) use of anticoagulants: Secondary | ICD-10-CM | POA: Diagnosis not present

## 2020-08-02 DIAGNOSIS — R2689 Other abnormalities of gait and mobility: Secondary | ICD-10-CM | POA: Diagnosis not present

## 2020-08-02 DIAGNOSIS — E1159 Type 2 diabetes mellitus with other circulatory complications: Secondary | ICD-10-CM | POA: Diagnosis not present

## 2020-08-02 DIAGNOSIS — W1789XA Other fall from one level to another, initial encounter: Secondary | ICD-10-CM | POA: Diagnosis not present

## 2020-08-02 DIAGNOSIS — I152 Hypertension secondary to endocrine disorders: Secondary | ICD-10-CM | POA: Diagnosis not present

## 2020-08-02 NOTE — Telephone Encounter (Signed)
Will route to Dr. Tanna Furry nurse due to concern being palps and pt has hx of AF.

## 2020-08-02 NOTE — Telephone Encounter (Signed)
Returned call to [employer].  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  Advised if she is calling to schedule a hospital follow up to call [facility].  If she needed medical assistance to call back office and ask for this nurse.

## 2020-08-02 NOTE — Telephone Encounter (Signed)
[name] with [name] requested to leave her extension #, [phone] (ext: [phone]).

## 2020-08-02 NOTE — Telephone Encounter (Signed)
Patient c/o Palpitations:  High priority if patient c/o lightheadedness, shortness of breath, or chest pain  1) How long have you had palpitations/irregular HR/ Afib? Are you having the symptoms now? Since 07/27/20 per Arianna with [facility]. She is not currently with the patient  2) Are you currently experiencing lightheadedness, SOB or CP? No   3) Do you have a history of afib (atrial fibrillation) or irregular heart rhythm? Yes   4) Have you checked your BP or HR? (document readings if available):  03/10: 128/65 69  03/09: 122/67 56  157/72 64  03/08: 100/59 60     5) Are you experiencing any other symptoms? No

## 2020-08-03 DIAGNOSIS — N183 Chronic kidney disease, stage 3 unspecified: Secondary | ICD-10-CM | POA: Diagnosis not present

## 2020-08-03 DIAGNOSIS — R2681 Unsteadiness on feet: Secondary | ICD-10-CM | POA: Diagnosis not present

## 2020-08-03 DIAGNOSIS — I509 Heart failure, unspecified: Secondary | ICD-10-CM | POA: Diagnosis not present

## 2020-08-03 DIAGNOSIS — E785 Hyperlipidemia, unspecified: Secondary | ICD-10-CM | POA: Diagnosis not present

## 2020-08-03 DIAGNOSIS — F3189 Other bipolar disorder: Secondary | ICD-10-CM | POA: Diagnosis not present

## 2020-08-03 DIAGNOSIS — S066X9D Traumatic subarachnoid hemorrhage with loss of consciousness of unspecified duration, subsequent encounter: Secondary | ICD-10-CM | POA: Diagnosis not present

## 2020-08-03 DIAGNOSIS — K219 Gastro-esophageal reflux disease without esophagitis: Secondary | ICD-10-CM | POA: Diagnosis not present

## 2020-08-03 DIAGNOSIS — M6281 Muscle weakness (generalized): Secondary | ICD-10-CM | POA: Diagnosis not present

## 2020-08-03 DIAGNOSIS — R296 Repeated falls: Secondary | ICD-10-CM | POA: Diagnosis not present

## 2020-08-03 DIAGNOSIS — E039 Hypothyroidism, unspecified: Secondary | ICD-10-CM | POA: Diagnosis not present

## 2020-08-03 DIAGNOSIS — I10 Essential (primary) hypertension: Secondary | ICD-10-CM | POA: Diagnosis not present

## 2020-08-03 DIAGNOSIS — Z952 Presence of prosthetic heart valve: Secondary | ICD-10-CM | POA: Diagnosis not present

## 2020-08-03 DIAGNOSIS — F319 Bipolar disorder, unspecified: Secondary | ICD-10-CM | POA: Diagnosis not present

## 2020-08-03 DIAGNOSIS — Z029 Encounter for administrative examinations, unspecified: Secondary | ICD-10-CM | POA: Diagnosis not present

## 2020-08-03 DIAGNOSIS — I480 Paroxysmal atrial fibrillation: Secondary | ICD-10-CM | POA: Diagnosis not present

## 2020-08-07 DIAGNOSIS — R2681 Unsteadiness on feet: Secondary | ICD-10-CM | POA: Diagnosis not present

## 2020-08-07 DIAGNOSIS — R41841 Cognitive communication deficit: Secondary | ICD-10-CM | POA: Diagnosis not present

## 2020-08-07 DIAGNOSIS — S066X9D Traumatic subarachnoid hemorrhage with loss of consciousness of unspecified duration, subsequent encounter: Secondary | ICD-10-CM | POA: Diagnosis not present

## 2020-08-07 DIAGNOSIS — R2689 Other abnormalities of gait and mobility: Secondary | ICD-10-CM | POA: Diagnosis not present

## 2020-08-07 DIAGNOSIS — Z9181 History of falling: Secondary | ICD-10-CM | POA: Diagnosis not present

## 2020-08-07 DIAGNOSIS — R278 Other lack of coordination: Secondary | ICD-10-CM | POA: Diagnosis not present

## 2020-08-07 DIAGNOSIS — M6281 Muscle weakness (generalized): Secondary | ICD-10-CM | POA: Diagnosis not present

## 2020-08-07 DIAGNOSIS — R262 Difficulty in walking, not elsewhere classified: Secondary | ICD-10-CM | POA: Diagnosis not present

## 2020-08-07 NOTE — Telephone Encounter (Signed)
Follow up scheduled with Dr. Tamala Julian

## 2020-08-08 DIAGNOSIS — D510 Vitamin B12 deficiency anemia due to intrinsic factor deficiency: Secondary | ICD-10-CM | POA: Diagnosis not present

## 2020-08-08 DIAGNOSIS — F319 Bipolar disorder, unspecified: Secondary | ICD-10-CM | POA: Diagnosis not present

## 2020-08-08 DIAGNOSIS — E785 Hyperlipidemia, unspecified: Secondary | ICD-10-CM | POA: Diagnosis not present

## 2020-08-08 DIAGNOSIS — K219 Gastro-esophageal reflux disease without esophagitis: Secondary | ICD-10-CM | POA: Diagnosis not present

## 2020-08-09 ENCOUNTER — Telehealth: Payer: Self-pay | Admitting: Interventional Cardiology

## 2020-08-09 DIAGNOSIS — R2681 Unsteadiness on feet: Secondary | ICD-10-CM | POA: Diagnosis not present

## 2020-08-09 DIAGNOSIS — E039 Hypothyroidism, unspecified: Secondary | ICD-10-CM | POA: Diagnosis not present

## 2020-08-09 DIAGNOSIS — K219 Gastro-esophageal reflux disease without esophagitis: Secondary | ICD-10-CM | POA: Diagnosis not present

## 2020-08-09 DIAGNOSIS — E785 Hyperlipidemia, unspecified: Secondary | ICD-10-CM | POA: Diagnosis not present

## 2020-08-09 DIAGNOSIS — I10 Essential (primary) hypertension: Secondary | ICD-10-CM | POA: Diagnosis not present

## 2020-08-09 DIAGNOSIS — S066X9D Traumatic subarachnoid hemorrhage with loss of consciousness of unspecified duration, subsequent encounter: Secondary | ICD-10-CM | POA: Diagnosis not present

## 2020-08-09 DIAGNOSIS — Z952 Presence of prosthetic heart valve: Secondary | ICD-10-CM | POA: Diagnosis not present

## 2020-08-09 DIAGNOSIS — F319 Bipolar disorder, unspecified: Secondary | ICD-10-CM | POA: Diagnosis not present

## 2020-08-09 DIAGNOSIS — N183 Chronic kidney disease, stage 3 unspecified: Secondary | ICD-10-CM | POA: Diagnosis not present

## 2020-08-09 DIAGNOSIS — I509 Heart failure, unspecified: Secondary | ICD-10-CM | POA: Diagnosis not present

## 2020-08-09 DIAGNOSIS — M6281 Muscle weakness (generalized): Secondary | ICD-10-CM | POA: Diagnosis not present

## 2020-08-09 DIAGNOSIS — I480 Paroxysmal atrial fibrillation: Secondary | ICD-10-CM | POA: Diagnosis not present

## 2020-08-09 NOTE — Telephone Encounter (Signed)
Patient called to talk with Dr. Smith or nurse. Please call back 

## 2020-08-09 NOTE — Telephone Encounter (Signed)
Spoke with pt's daughter, Warren Lacy, [place] who is asking for Dr Tamala Julian to complete letter for Medicaid which is due on 08/14/2020.  Daughter advised letter has been completed and is up front to be picked up.  [name] verbalizes understanding and thanked Therapist, sports for the call.

## 2020-08-13 DIAGNOSIS — Z7901 Long term (current) use of anticoagulants: Secondary | ICD-10-CM | POA: Diagnosis not present

## 2020-08-14 ENCOUNTER — Ambulatory Visit (INDEPENDENT_AMBULATORY_CARE_PROVIDER_SITE_OTHER): Payer: HMO

## 2020-08-14 DIAGNOSIS — Z7901 Long term (current) use of anticoagulants: Secondary | ICD-10-CM | POA: Diagnosis not present

## 2020-08-14 DIAGNOSIS — I152 Hypertension secondary to endocrine disorders: Secondary | ICD-10-CM | POA: Diagnosis not present

## 2020-08-14 DIAGNOSIS — I4891 Unspecified atrial fibrillation: Secondary | ICD-10-CM | POA: Diagnosis not present

## 2020-08-14 DIAGNOSIS — Z952 Presence of prosthetic heart valve: Secondary | ICD-10-CM | POA: Diagnosis not present

## 2020-08-14 LAB — CUP PACEART REMOTE DEVICE CHECK
Battery Remaining Longevity: 180 mo
Battery Remaining Percentage: 100 %
Brady Statistic RA Percent Paced: 84 %
Brady Statistic RV Percent Paced: 3 %
Date Time Interrogation Session: 20220321154900
Implantable Lead Implant Date: 20210816
Implantable Lead Implant Date: 20210816
Implantable Lead Location: 753859
Implantable Lead Location: 753860
Implantable Lead Model: 7842
Implantable Lead Model: 7842
Implantable Lead Serial Number: 1013287
Implantable Lead Serial Number: 1091261
Implantable Pulse Generator Implant Date: 20210816
Lead Channel Impedance Value: 459 Ohm
Lead Channel Impedance Value: 648 Ohm
Lead Channel Setting Pacing Amplitude: 2.5 V
Lead Channel Setting Pacing Amplitude: 2.5 V
Lead Channel Setting Pacing Pulse Width: 0.4 ms
Lead Channel Setting Sensing Sensitivity: 2.5 mV
Pulse Gen Serial Number: 940432

## 2020-08-16 ENCOUNTER — Other Ambulatory Visit (HOSPITAL_COMMUNITY): Payer: Self-pay | Admitting: Neurosurgery

## 2020-08-16 DIAGNOSIS — R269 Unspecified abnormalities of gait and mobility: Secondary | ICD-10-CM | POA: Diagnosis not present

## 2020-08-16 DIAGNOSIS — I4891 Unspecified atrial fibrillation: Secondary | ICD-10-CM | POA: Diagnosis not present

## 2020-08-16 DIAGNOSIS — G959 Disease of spinal cord, unspecified: Secondary | ICD-10-CM

## 2020-08-16 DIAGNOSIS — E1159 Type 2 diabetes mellitus with other circulatory complications: Secondary | ICD-10-CM | POA: Diagnosis not present

## 2020-08-16 DIAGNOSIS — F319 Bipolar disorder, unspecified: Secondary | ICD-10-CM | POA: Diagnosis not present

## 2020-08-20 DIAGNOSIS — E1159 Type 2 diabetes mellitus with other circulatory complications: Secondary | ICD-10-CM | POA: Diagnosis not present

## 2020-08-20 DIAGNOSIS — Z7901 Long term (current) use of anticoagulants: Secondary | ICD-10-CM | POA: Diagnosis not present

## 2020-08-21 ENCOUNTER — Ambulatory Visit: Payer: HMO | Admitting: Psychiatry

## 2020-08-22 ENCOUNTER — Other Ambulatory Visit: Payer: Self-pay | Admitting: Family Medicine

## 2020-08-23 NOTE — Progress Notes (Signed)
Remote pacemaker transmission.   

## 2020-08-24 DIAGNOSIS — R278 Other lack of coordination: Secondary | ICD-10-CM | POA: Diagnosis not present

## 2020-08-24 DIAGNOSIS — R2681 Unsteadiness on feet: Secondary | ICD-10-CM | POA: Diagnosis not present

## 2020-08-24 DIAGNOSIS — M6281 Muscle weakness (generalized): Secondary | ICD-10-CM | POA: Diagnosis not present

## 2020-08-24 DIAGNOSIS — R41841 Cognitive communication deficit: Secondary | ICD-10-CM | POA: Diagnosis not present

## 2020-08-24 DIAGNOSIS — S066X9D Traumatic subarachnoid hemorrhage with loss of consciousness of unspecified duration, subsequent encounter: Secondary | ICD-10-CM | POA: Diagnosis not present

## 2020-08-24 DIAGNOSIS — R262 Difficulty in walking, not elsewhere classified: Secondary | ICD-10-CM | POA: Diagnosis not present

## 2020-08-24 DIAGNOSIS — Z9181 History of falling: Secondary | ICD-10-CM | POA: Diagnosis not present

## 2020-08-24 DIAGNOSIS — R2689 Other abnormalities of gait and mobility: Secondary | ICD-10-CM | POA: Diagnosis not present

## 2020-08-27 NOTE — Progress Notes (Deleted)
Cardiology Office Note    Date:  08/27/2020   ID:  [name], DOB December 06, 1950, MRN [id number]   PCP:  Mosie Lukes, MD   [place]  Cardiologist:  Sinclair Grooms, MD *** Advanced Practice Provider:  No care team member to display Electrophysiologist:  None   [phone]   No chief complaint on file.   History of Present Illness:  [name] is a 70 y.o. female is a patient with longstanding heart disease.  She has a history of aortic valve replacement with a mechanical prosthesis 2011, anticoagulation with Coumadin, paroxysmal atrial fibrillation, type 2 diabetes mellitus, permanent pacemaker implantation August 2021, and most recently amiodarone therapy for rhythm control.  Her other noncardiac problems include bipolar disorder, anemia, and recently frailty with multiple falls.    Patient recently hospitalized at [facility] in [place] from 2/3 until 2/28 status post fall while visiting her son.  During that hospitalization she was found to have a large left subdural hematoma, subarachnoid hemorrhage and some events of cerebral edema requiring emergent intubation, emergent reversal of her anticoagulant and emergent neurosurgical intervention with craniotomy and evacuation of the bleed.  Patient was in the intensive care unit for the majority of her hospital stay and eventually discharged to [facility] on 07/23/2020.She had another fall and was discharged 07/25/20 with stable subdural hematoma on CT.  Patient also had orthostatic hypotension-Metoprolol decreased 12.5 mg bid, amldodipine and amiodarone were stopped.   Unfortunately, she will need to remain on anticoagulation therapy due to the mechanical valve which will thrombose without therapy.       Past Medical History:  Diagnosis Date  . ANEMIA 05/28/2010  . AORTIC VALVE REPLACEMENT, HX OF 03/11/2010   Mechanical prosthesis  . BIPOLAR AFFECTIVE DISORDER  03/11/2010  . Depression   . Diverticulitis 12/18/2010  . FIBROIDS, UTERUS 03/11/2010  . Gout 09/04/2016  . Grave's disease 8-12  . Hyperlipidemia associated with type 2 diabetes mellitus ([place]) 06/05/2016  . Hypertension   . Low back pain 11/02/2017  . Migraine 06/05/2016  . Mixed hyperlipidemia 03/11/2010  . Obesity   . Skin cancer   . Syncope 08/22/2019  . UTI (lower urinary tract infection) 08/14/2011    Past Surgical History:  Procedure Laterality Date  . ABDOMINAL HYSTERECTOMY  ?1998   sb/l spo, total for fibroids/heavy bleeding  . ANKLE SURGERY Left 01/2018   hardware removal  . AORTIC VALVE REPLACEMENT  2011   Mechanical prosthesis, [employer] Jude  . APPENDECTOMY     Required revision for EColi infection, required recurrent packing  . bowel obstruction     Requiring adhesions to be removed  . CARDIOVERSION N/A 05/09/2020   Procedure: CARDIOVERSION;  Surgeon: [name], MD;  Location: [place];  Service: Cardiovascular;  Laterality: N/A;  . CHOLECYSTECTOMY    . COLONOSCOPY WITH PROPOFOL N/A 11/01/2015   Procedure: COLONOSCOPY WITH PROPOFOL;  Surgeon: Milus Banister, MD;  Location: [facility];  Service: Endoscopy;  Laterality: N/A;  . CYSTOCELE REPAIR N/A 10/16/2015   Procedure: ANTERIOR REPAIR (CYSTOCELE);  Surgeon: Donnamae Jude, MD;  Location: [facility];  Service: Gynecology;  Laterality: N/A;  . HERNIA REPAIR  08-16-10  . LEFT HEART CATHETERIZATION WITH CORONARY ANGIOGRAM N/A 12/05/2011   Procedure: LEFT HEART CATHETERIZATION WITH CORONARY ANGIOGRAM;  Surgeon: Sinclair Grooms, MD;  Location: [facility] CATH LAB;  Service: Cardiovascular;  Laterality: N/A;  . PACEMAKER IMPLANT N/A 01/09/2020  Procedure: PACEMAKER IMPLANT;  Surgeon: Evans Lance, MD;  Location: [place] CV LAB;  Service: Cardiovascular;  Laterality: N/A;  . TOTAL KNEE ARTHROPLASTY Right 02/24/2013   Procedure: RIGHT TOTAL KNEE ARTHROPLASTY;  Surgeon: Johnn Hai, MD;  Location: [facility];  Service:  Orthopedics;  Laterality: Right;  . TUBAL LIGATION    . varicose vein surgery b/l legs      Current Medications: No outpatient medications have been marked as taking for the 08/29/20 encounter (Appointment) with Imogene Burn, PA-C.     Allergies:   Mucinex [guaifenesin er], Keflex [cephalexin], Keflex [cephalexin], Mucinex [guaifenesin er], Sulfa antibiotics, Sulfa antibiotics, and Sulfonamide derivatives   Social History   Socioeconomic History  . Marital status: Married    Spouse name: Not on file  . Number of children: Not on file  . Years of education: Not on file  . Highest education level: Not on file  Occupational History  . Not on file  Tobacco Use  . Smoking status: Former Smoker    Quit date: 05/26/1990    Years since quitting: 30.2  . Smokeless tobacco: Never Used  Vaping Use  . Vaping Use: Never used  Substance and Sexual Activity  . Alcohol use: Yes    Alcohol/week: 0.0 standard drinks    Comment: rare  . Drug use: No  . Sexual activity: Not on file  Other Topics Concern  . Not on file  Social History Narrative   ** Merged History Encounter **       Social Determinants of Health   Financial Resource Strain: Not on file  Food Insecurity: Not on file  Transportation Needs: Not on file  Physical Activity: Not on file  Stress: Not on file  Social Connections: Not on file     Family History:  The patient's ***family history includes Alcohol abuse in her brother, brother, father, and maternal grandfather; Aneurysm in her mother; Arthritis in her mother; Cancer in her maternal grandmother; Depression in her brother, brother, paternal grandfather, paternal grandmother, sister, and sister; Diabetes in her maternal grandfather, paternal grandfather, and paternal grandmother; Heart disease in her brother and maternal grandfather; Scoliosis in her mother.   ROS:   Please see the history of present illness.    ROS All other systems reviewed and are  negative.   PHYSICAL EXAM:   VS:  There were no vitals taken for this visit.  Physical Exam  GEN: Well nourished, well developed, in no acute distress  HEENT: normal  Neck: no JVD, carotid bruits, or masses Cardiac:RRR; no murmurs, rubs, or gallops  Respiratory:  clear to auscultation bilaterally, normal work of breathing GI: soft, nontender, nondistended, + BS Ext: without cyanosis, clubbing, or edema, Good distal pulses bilaterally MS: no deformity or atrophy  Skin: warm and dry, no rash Neuro:  Alert and Oriented x 3, Strength and sensation are intact Psych: euthymic mood, full affect  Wt Readings from Last 3 Encounters:  07/23/20 191 lb (86.6 kg)  06/13/20 207 lb (93.9 kg)  06/08/20 209 lb (94.8 kg)      Studies/Labs Reviewed:   EKG:  EKG is*** ordered today.  The ekg ordered today demonstrates ***  Recent Labs: 05/01/2020: Magnesium 2.1 06/26/2020: TSH 3.43 07/24/2020: ALT 32; Hemoglobin 9.5; Platelets 207 07/25/2020: BUN 20; Creatinine, Ser 1.33; Potassium 3.8; Sodium 137   Lipid Panel    Component Value Date/Time   CHOL 147 07/24/2020 0358   TRIG 124 07/24/2020 0358   HDL 62 07/24/2020  0358   CHOLHDL 2.4 07/24/2020 0358   VLDL 25 07/24/2020 0358   LDLCALC 60 07/24/2020 0358   LDLDIRECT 131.8 (H) 08/23/2019 0312    Additional studies/ records that were reviewed today include:  2D echo 03/28/2020 IMPRESSIONS     1. Left ventricular ejection fraction, by estimation, is 50 to 55%. The  left ventricle has low normal function. Left ventricular endocardial  border not optimally defined to evaluate regional wall motion. Left  ventricular diastolic parameters are  indeterminate.   2. Right ventricular systolic function is mildly reduced. The right  ventricular size is normal. Tricuspid regurgitation signal is inadequate  for assessing PA pressure.   3. The mitral valve is degenerative. No evidence of mitral valve  regurgitation. No evidence of mitral stenosis.  Moderate mitral annular  calcification.   4. The aortic valve has been repaired/replaced. Aortic valve  regurgitation is trivial. No aortic stenosis is present. Mechanical valve  leaflets not well visualized. There is a 25 mm St. Jude mechanical valved  conduit valve present in the aortic  position. Procedure Date: 05/22/2008. Echo findings are consistent with  normal structure and function of the aortic valve prosthesis. Aortic valve  mean gradient measures 7.0 mmHg.   5. Aortic root/ascending aorta has been repaired/replaced. Grossly normal  appearance in proximal segment, not well visualized beyond proximal  ascending aorta.   6. The inferior vena cava is normal in size with greater than 50%  respiratory variability, suggesting right atrial pressure of 3 mmHg.   Conclusion(s)/Recommendation(s): No intracardiac source of embolism  detected on this transthoracic study. A transesophageal echocardiogram is  recommended to exclude cardiac source of embolism if clinically indicated.   FINDINGS     Risk Assessment/Calculations:   {Does this patient have ATRIAL FIBRILLATION?:[phone]}     ASSESSMENT:    No diagnosis found.   PLAN:  In order of problems listed above:  Subdural hematoma and SAH secondary to fall while on Coumadin with extensive hospitalization in [place] and again here  Orthostatic hypotension metoprolol decreased and amlodipine and amiodarone stopped  Status post AVR with mechanical prosthesis 2011 on Coumadin metoprolol dose lowered in the hospital because of orthostasis and amiodarone stopped  PAF on Coumadin and successful cardioversion 05/09/2020  Permanent pacemaker 12/2019 most recently on amiodarone for rhythm control histogram 08/13/2020 normal    Shared Decision Making/Informed Consent   {Are you ordering a CV Procedure (e.g. stress test, cath, DCCV, TEE, etc)?   Press F2        :[id number]}    Medication Adjustments/Labs and Tests  Ordered: Current medicines are reviewed at length with the patient today.  Concerns regarding medicines are outlined above.  Medication changes, Labs and Tests ordered today are listed in the Patient Instructions below. There are no Patient Instructions on file for this visit.   Sumner Boast, PA-C  08/27/2020 3:18 PM    [facility] [place], [place], [place]  [address] Phone: [phone]; Fax: [phone]

## 2020-08-28 ENCOUNTER — Ambulatory Visit (HOSPITAL_COMMUNITY)
Admission: RE | Admit: 2020-08-28 | Discharge: 2020-08-28 | Disposition: A | Discharge status: Home / Self Care | Payer: HMO | Source: Ambulatory Visit | Attending: Neurosurgery | Admitting: Neurosurgery

## 2020-08-28 ENCOUNTER — Other Ambulatory Visit: Payer: Self-pay

## 2020-08-28 DIAGNOSIS — G959 Disease of spinal cord, unspecified: Secondary | ICD-10-CM | POA: Insufficient documentation

## 2020-08-28 DIAGNOSIS — Z8669 Personal history of other diseases of the nervous system and sense organs: Secondary | ICD-10-CM | POA: Diagnosis not present

## 2020-08-28 DIAGNOSIS — M47812 Spondylosis without myelopathy or radiculopathy, cervical region: Secondary | ICD-10-CM | POA: Diagnosis not present

## 2020-08-28 DIAGNOSIS — M4319 Spondylolisthesis, multiple sites in spine: Secondary | ICD-10-CM | POA: Diagnosis not present

## 2020-08-28 DIAGNOSIS — M4802 Spinal stenosis, cervical region: Secondary | ICD-10-CM | POA: Diagnosis not present

## 2020-08-28 NOTE — Progress Notes (Signed)
Per order, Changed device settings for MRI to DOO at 80 bpm   Tachy-therapies to off if applicable.   Will program device back to pre-MRI settings after completion of exam 

## 2020-08-28 NOTE — Progress Notes (Signed)
Informed of MRI for today.   Device system confirmed to be MRI conditional, with implant date > 6 weeks ago and no evidence of abandoned or epicardial leads in review of most recent CXR Interrogation from today reviewed by [employer] rep who recommends DOO 80 for MRI.  Change device settings for MRI to DOO at 80 bpm  Tachy-therapies to off if applicable.  Program device back to pre-MRI settings after completion of exam.  Annamaria Helling  08/28/2020 12:58 PM

## 2020-08-29 ENCOUNTER — Ambulatory Visit: Payer: HMO | Admitting: Physician Assistant

## 2020-08-29 DIAGNOSIS — R3989 Other symptoms and signs involving the genitourinary system: Secondary | ICD-10-CM | POA: Diagnosis not present

## 2020-08-30 DIAGNOSIS — N3000 Acute cystitis without hematuria: Secondary | ICD-10-CM | POA: Diagnosis not present

## 2020-08-30 DIAGNOSIS — N179 Acute kidney failure, unspecified: Secondary | ICD-10-CM | POA: Diagnosis not present

## 2020-09-04 DIAGNOSIS — Z952 Presence of prosthetic heart valve: Secondary | ICD-10-CM | POA: Diagnosis not present

## 2020-09-04 DIAGNOSIS — I480 Paroxysmal atrial fibrillation: Secondary | ICD-10-CM | POA: Diagnosis not present

## 2020-09-04 DIAGNOSIS — N390 Urinary tract infection, site not specified: Secondary | ICD-10-CM | POA: Diagnosis not present

## 2020-09-04 DIAGNOSIS — B9629 Other Escherichia coli [E. coli] as the cause of diseases classified elsewhere: Secondary | ICD-10-CM | POA: Diagnosis not present

## 2020-09-04 DIAGNOSIS — Z7901 Long term (current) use of anticoagulants: Secondary | ICD-10-CM | POA: Diagnosis not present

## 2020-09-04 DIAGNOSIS — Z1612 Extended spectrum beta lactamase (ESBL) resistance: Secondary | ICD-10-CM | POA: Diagnosis not present

## 2020-09-10 DIAGNOSIS — Z952 Presence of prosthetic heart valve: Secondary | ICD-10-CM | POA: Diagnosis not present

## 2020-09-10 DIAGNOSIS — F3132 Bipolar disorder, current episode depressed, moderate: Secondary | ICD-10-CM | POA: Diagnosis not present

## 2020-09-10 DIAGNOSIS — G4700 Insomnia, unspecified: Secondary | ICD-10-CM | POA: Diagnosis not present

## 2020-09-10 DIAGNOSIS — Z7901 Long term (current) use of anticoagulants: Secondary | ICD-10-CM | POA: Diagnosis not present

## 2020-09-10 DIAGNOSIS — I480 Paroxysmal atrial fibrillation: Secondary | ICD-10-CM | POA: Diagnosis not present

## 2020-09-13 DIAGNOSIS — Z7901 Long term (current) use of anticoagulants: Secondary | ICD-10-CM | POA: Diagnosis not present

## 2020-09-13 DIAGNOSIS — E1169 Type 2 diabetes mellitus with other specified complication: Secondary | ICD-10-CM | POA: Diagnosis not present

## 2020-09-13 DIAGNOSIS — E7849 Other hyperlipidemia: Secondary | ICD-10-CM | POA: Diagnosis not present

## 2020-09-14 DIAGNOSIS — Z8679 Personal history of other diseases of the circulatory system: Secondary | ICD-10-CM | POA: Diagnosis not present

## 2020-09-14 DIAGNOSIS — I4891 Unspecified atrial fibrillation: Secondary | ICD-10-CM | POA: Diagnosis not present

## 2020-09-14 DIAGNOSIS — I4892 Unspecified atrial flutter: Secondary | ICD-10-CM | POA: Diagnosis not present

## 2020-09-14 DIAGNOSIS — R262 Difficulty in walking, not elsewhere classified: Secondary | ICD-10-CM | POA: Diagnosis not present

## 2020-09-19 DIAGNOSIS — I152 Hypertension secondary to endocrine disorders: Secondary | ICD-10-CM | POA: Diagnosis not present

## 2020-09-19 DIAGNOSIS — E1169 Type 2 diabetes mellitus with other specified complication: Secondary | ICD-10-CM | POA: Diagnosis not present

## 2020-09-19 DIAGNOSIS — I4891 Unspecified atrial fibrillation: Secondary | ICD-10-CM | POA: Diagnosis not present

## 2020-09-19 DIAGNOSIS — R195 Other fecal abnormalities: Secondary | ICD-10-CM | POA: Diagnosis not present

## 2020-09-20 DIAGNOSIS — I10 Essential (primary) hypertension: Secondary | ICD-10-CM | POA: Diagnosis not present

## 2020-09-20 DIAGNOSIS — R198 Other specified symptoms and signs involving the digestive system and abdomen: Secondary | ICD-10-CM | POA: Diagnosis not present

## 2020-09-20 DIAGNOSIS — I480 Paroxysmal atrial fibrillation: Secondary | ICD-10-CM | POA: Diagnosis not present

## 2020-09-21 DIAGNOSIS — Z7901 Long term (current) use of anticoagulants: Secondary | ICD-10-CM | POA: Diagnosis not present

## 2020-09-21 DIAGNOSIS — E119 Type 2 diabetes mellitus without complications: Secondary | ICD-10-CM | POA: Diagnosis not present

## 2020-09-21 DIAGNOSIS — K5730 Diverticulosis of large intestine without perforation or abscess without bleeding: Secondary | ICD-10-CM | POA: Diagnosis not present

## 2020-09-23 DIAGNOSIS — R41841 Cognitive communication deficit: Secondary | ICD-10-CM | POA: Diagnosis not present

## 2020-09-23 DIAGNOSIS — Z9181 History of falling: Secondary | ICD-10-CM | POA: Diagnosis not present

## 2020-09-23 DIAGNOSIS — R278 Other lack of coordination: Secondary | ICD-10-CM | POA: Diagnosis not present

## 2020-09-23 DIAGNOSIS — R2689 Other abnormalities of gait and mobility: Secondary | ICD-10-CM | POA: Diagnosis not present

## 2020-09-23 DIAGNOSIS — S066X9D Traumatic subarachnoid hemorrhage with loss of consciousness of unspecified duration, subsequent encounter: Secondary | ICD-10-CM | POA: Diagnosis not present

## 2020-09-23 DIAGNOSIS — R2681 Unsteadiness on feet: Secondary | ICD-10-CM | POA: Diagnosis not present

## 2020-09-23 DIAGNOSIS — M6281 Muscle weakness (generalized): Secondary | ICD-10-CM | POA: Diagnosis not present

## 2020-09-23 DIAGNOSIS — R262 Difficulty in walking, not elsewhere classified: Secondary | ICD-10-CM | POA: Diagnosis not present

## 2020-09-24 DIAGNOSIS — F3132 Bipolar disorder, current episode depressed, moderate: Secondary | ICD-10-CM | POA: Diagnosis not present

## 2020-09-24 DIAGNOSIS — F5101 Primary insomnia: Secondary | ICD-10-CM | POA: Diagnosis not present

## 2020-09-25 NOTE — Progress Notes (Deleted)
Cardiology Office Note    Date:  09/25/2020   ID:  [name], DOB November 11, 1950, MRN [id number]   PCP:  Mosie Lukes, MD   [place]  Cardiologist:  Sinclair Grooms, MD *** Advanced Practice Provider:  No care team member to display Electrophysiologist:  None   [phone]   No chief complaint on file.   History of Present Illness:  [name] is a 70 y.o. female is a patient with longstanding heart disease.  She has a history of aortic valve replacement with a mechanical prosthesis 2011, anticoagulation with Coumadin, paroxysmal atrial fibrillation, type 2 diabetes mellitus, permanent pacemaker implantation August 2021, and most recently amiodarone therapy for rhythm control.  Her other noncardiac problems include bipolar disorder, anemia, and recently frailty with multiple falls.    Patient recently hospitalized at [facility] in [place] from 2/3 until 2/28 status post fall while visiting her son.  During that hospitalization she was found to have a large left subdural hematoma, subarachnoid hemorrhage and some events of cerebral edema requiring emergent intubation, emergent reversal of her anticoagulant and emergent neurosurgical intervention with craniotomy and evacuation of the bleed.  Patient was in the intensive care unit for the majority of her hospital stay and eventually discharged to [facility] on 07/23/2020. Patient also had orthostatic hypotension-Metoprolol decreased 12.5 mg bid, amldodipine and amiodarone were stopped.   Unfortunately, she will need to remain on anticoagulation therapy due to the mechanical valve which will thrombose without therapy.     Past Medical History:  Diagnosis Date  . ANEMIA 05/28/2010  . AORTIC VALVE REPLACEMENT, HX OF 03/11/2010   Mechanical prosthesis  . BIPOLAR AFFECTIVE DISORDER 03/11/2010  . Depression   . Diverticulitis 12/18/2010  . FIBROIDS, UTERUS 03/11/2010  .  Gout 09/04/2016  . Grave's disease 8-12  . Hyperlipidemia associated with type 2 diabetes mellitus ([place]) 06/05/2016  . Hypertension   . Low back pain 11/02/2017  . Migraine 06/05/2016  . Mixed hyperlipidemia 03/11/2010  . Obesity   . Skin cancer   . Syncope 08/22/2019  . UTI (lower urinary tract infection) 08/14/2011    Past Surgical History:  Procedure Laterality Date  . ABDOMINAL HYSTERECTOMY  ?1998   sb/l spo, total for fibroids/heavy bleeding  . ANKLE SURGERY Left 01/2018   hardware removal  . AORTIC VALVE REPLACEMENT  2011   Mechanical prosthesis, [employer] Jude  . APPENDECTOMY     Required revision for EColi infection, required recurrent packing  . bowel obstruction     Requiring adhesions to be removed  . CARDIOVERSION N/A 05/09/2020   Procedure: CARDIOVERSION;  Surgeon: [name], MD;  Location: [place];  Service: Cardiovascular;  Laterality: N/A;  . CHOLECYSTECTOMY    . COLONOSCOPY WITH PROPOFOL N/A 11/01/2015   Procedure: COLONOSCOPY WITH PROPOFOL;  Surgeon: Milus Banister, MD;  [facility]: [facility];  Service: Endoscopy;  Laterality: N/A;  . CYSTOCELE REPAIR N/A 10/16/2015   Procedure: ANTERIOR REPAIR (CYSTOCELE);  Surgeon: Donnamae Jude, MD;  Location: [facility];  Service: Gynecology;  Laterality: N/A;  . HERNIA REPAIR  08-16-10  . LEFT HEART CATHETERIZATION WITH CORONARY ANGIOGRAM N/A 12/05/2011   Procedure: LEFT HEART CATHETERIZATION WITH CORONARY ANGIOGRAM;  Surgeon: Sinclair Grooms, MD;  Location: [facility] CATH LAB;  Service: Cardiovascular;  Laterality: N/A;  . PACEMAKER IMPLANT N/A 01/09/2020   Procedure: PACEMAKER IMPLANT;  Surgeon: Evans Lance, MD;  Location: [facility] INVASIVE CV  LAB;  Service: Cardiovascular;  Laterality: N/A;  . TOTAL KNEE ARTHROPLASTY Right 02/24/2013   Procedure: RIGHT TOTAL KNEE ARTHROPLASTY;  Surgeon: Johnn Hai, MD;  [facility]: [facility];  Service: Orthopedics;  Laterality: Right;  . TUBAL LIGATION    . varicose vein surgery b/l legs       Current Medications: No outpatient medications have been marked as taking for the 10/03/20 encounter (Appointment) with Imogene Burn, PA-C.     Allergies:   Mucinex [guaifenesin er], Keflex [cephalexin], Keflex [cephalexin], Mucinex [guaifenesin er], Sulfa antibiotics, Sulfa antibiotics, and Sulfonamide derivatives   Social History   Socioeconomic History  . Marital status: Married    Spouse name: Not on file  . Number of children: Not on file  . Years of education: Not on file  . Highest education level: Not on file  Occupational History  . Not on file  Tobacco Use  . Smoking status: Former Smoker    Quit date: 05/26/1990    Years since quitting: 30.3  . Smokeless tobacco: Never Used  Vaping Use  . Vaping Use: Never used  Substance and Sexual Activity  . Alcohol use: Yes    Alcohol/week: 0.0 standard drinks    Comment: rare  . Drug use: No  . Sexual activity: Not on file  Other Topics Concern  . Not on file  Social History Narrative   ** Merged History Encounter **       Social Determinants of Health   Financial Resource Strain: Not on file  Food Insecurity: Not on file  Transportation Needs: Not on file  Physical Activity: Not on file  Stress: Not on file  Social Connections: Not on file     Family History:  The patient's ***family history includes Alcohol abuse in her brother, brother, father, and maternal grandfather; Aneurysm in her mother; Arthritis in her mother; Cancer in her maternal grandmother; Depression in her brother, brother, paternal grandfather, paternal grandmother, sister, and sister; Diabetes in her maternal grandfather, paternal grandfather, and paternal grandmother; Heart disease in her brother and maternal grandfather; Scoliosis in her mother.   ROS:   Please see the history of present illness.    ROS All other systems reviewed and are negative.   PHYSICAL EXAM:   VS:  There were no vitals taken for this visit.  Physical Exam  GEN:  Well nourished, well developed, in no acute distress  HEENT: normal  Neck: no JVD, carotid bruits, or masses Cardiac:RRR; no murmurs, rubs, or gallops  Respiratory:  clear to auscultation bilaterally, normal work of breathing GI: soft, nontender, nondistended, + BS Ext: without cyanosis, clubbing, or edema, Good distal pulses bilaterally MS: no deformity or atrophy  Skin: warm and dry, no rash Neuro:  Alert and Oriented x 3, Strength and sensation are intact Psych: euthymic mood, full affect  Wt Readings from Last 3 Encounters:  07/23/20 191 lb (86.6 kg)  06/13/20 207 lb (93.9 kg)  06/08/20 209 lb (94.8 kg)      Studies/Labs Reviewed:   EKG:  EKG is*** ordered today.  The ekg ordered today demonstrates ***  Recent Labs: 05/01/2020: Magnesium 2.1 06/26/2020: TSH 3.43 07/24/2020: ALT 32; Hemoglobin 9.5; Platelets 207 07/25/2020: BUN 20; Creatinine, Ser 1.33; Potassium 3.8; Sodium 137   Lipid Panel    Component Value Date/Time   CHOL 147 07/24/2020 0358   TRIG 124 07/24/2020 0358   HDL 62 07/24/2020 0358   CHOLHDL 2.4 07/24/2020 0358   VLDL 25 07/24/2020 0358  LDLCALC 60 07/24/2020 0358   LDLDIRECT 131.8 (H) 08/23/2019 9833    Additional studies/ records that were reviewed today include:   2D echo 03/28/2020 IMPRESSIONS     1. Left ventricular ejection fraction, by estimation, is 50 to 55%. The  left ventricle has low normal function. Left ventricular endocardial  border not optimally defined to evaluate regional wall motion. Left  ventricular diastolic parameters are  indeterminate.   2. Right ventricular systolic function is mildly reduced. The right  ventricular size is normal. Tricuspid regurgitation signal is inadequate  for assessing PA pressure.   3. The mitral valve is degenerative. No evidence of mitral valve  regurgitation. No evidence of mitral stenosis. Moderate mitral annular  calcification.   4. The aortic valve has been repaired/replaced. Aortic valve   regurgitation is trivial. No aortic stenosis is present. Mechanical valve  leaflets not well visualized. There is a 25 mm St. Jude mechanical valved  conduit valve present in the aortic  position. Procedure Date: 05/22/2008. Echo findings are consistent with  normal structure and function of the aortic valve prosthesis. Aortic valve  mean gradient measures 7.0 mmHg.   5. Aortic root/ascending aorta has been repaired/replaced. Grossly normal  appearance in proximal segment, not well visualized beyond proximal  ascending aorta.   6. The inferior vena cava is normal in size with greater than 50%  respiratory variability, suggesting right atrial pressure of 3 mmHg.   Conclusion(s)/Recommendation(s): No intracardiac source of embolism  detected on this transthoracic study. A transesophageal echocardiogram is  recommended to exclude cardiac source of embolism if clinically indicated.   FINDINGS    Risk Assessment/Calculations:   {Does this patient have ATRIAL FIBRILLATION?:[id number]}     ASSESSMENT:    No diagnosis found.   PLAN:  In order of problems listed above: Subdural hematoma and SAH secondary to fall while on Coumadin with extensive hospitalization in [place] and again here  Orthostatic hypotension metoprolol decreased and amlodipine and amiodarone stopped  Status post AVR with mechanical prosthesis 2011 on Coumadin metoprolol dose lowered in the hospital because of orthostasis and amiodarone stopped  PAF on Coumadin and successful cardioversion 05/09/2020  Permanent pacemaker 12/2019 most recently on amiodarone for rhythm control    Shared Decision Making/Informed Consent   {Are you ordering a CV Procedure (e.g. stress test, cath, DCCV, TEE, etc)?   Press F2        :[id number]}    Medication Adjustments/Labs and Tests Ordered: Current medicines are reviewed at length with the patient today.  Concerns regarding medicines are outlined above.  Medication  changes, Labs and Tests ordered today are listed in the Patient Instructions below. There are no Patient Instructions on file for this visit.   Sumner Boast, PA-C  09/25/2020 8:01 AM    [facility] [place], [place], [place]  [address] Phone: [phone]; Fax: [phone]

## 2020-09-26 ENCOUNTER — Telehealth: Payer: Self-pay | Admitting: Internal Medicine

## 2020-09-26 DIAGNOSIS — R600 Localized edema: Secondary | ICD-10-CM | POA: Diagnosis not present

## 2020-09-26 DIAGNOSIS — I951 Orthostatic hypotension: Secondary | ICD-10-CM | POA: Diagnosis not present

## 2020-09-26 DIAGNOSIS — N1831 Chronic kidney disease, stage 3a: Secondary | ICD-10-CM | POA: Diagnosis not present

## 2020-09-26 NOTE — Telephone Encounter (Signed)
Spoke with daughter and explained that the appt with Ermalinda Barrios, PA-C on 5/11 is not needed since pt is scheduled to see Dr. Lovena Le for her AF.  Daughter agreeable to cancel [facility] appt but also wanted Lovena Le appt moved out due to not wanting to expose her mom to any sickness that may be in her home because her family hasn't been feeling well.  Advised I will send message to EP scheduler and have her f/u with a new appt.  Daughter appreciative for call.

## 2020-09-26 NOTE — Telephone Encounter (Signed)
Patient's daughter requesting to schedule the patient's pacer check and general card follow appointments on the same day. I informed her due to billing purposes the appointments have to be scheduled on different days. She states this is not her problem and requests this be allowed anyway.

## 2020-09-27 ENCOUNTER — Encounter: Payer: HMO | Admitting: Internal Medicine

## 2020-09-27 DIAGNOSIS — R262 Difficulty in walking, not elsewhere classified: Secondary | ICD-10-CM | POA: Diagnosis not present

## 2020-09-27 DIAGNOSIS — B373 Candidiasis of vulva and vagina: Secondary | ICD-10-CM | POA: Diagnosis not present

## 2020-09-27 DIAGNOSIS — E119 Type 2 diabetes mellitus without complications: Secondary | ICD-10-CM | POA: Diagnosis not present

## 2020-10-01 DIAGNOSIS — R600 Localized edema: Secondary | ICD-10-CM | POA: Diagnosis not present

## 2020-10-01 DIAGNOSIS — Z7901 Long term (current) use of anticoagulants: Secondary | ICD-10-CM | POA: Diagnosis not present

## 2020-10-01 DIAGNOSIS — Z952 Presence of prosthetic heart valve: Secondary | ICD-10-CM | POA: Diagnosis not present

## 2020-10-03 ENCOUNTER — Ambulatory Visit: Payer: HMO | Admitting: Physician Assistant

## 2020-10-03 DIAGNOSIS — G959 Disease of spinal cord, unspecified: Secondary | ICD-10-CM | POA: Diagnosis not present

## 2020-10-05 DIAGNOSIS — I480 Paroxysmal atrial fibrillation: Secondary | ICD-10-CM | POA: Diagnosis not present

## 2020-10-05 DIAGNOSIS — Z952 Presence of prosthetic heart valve: Secondary | ICD-10-CM | POA: Diagnosis not present

## 2020-10-05 DIAGNOSIS — Z7901 Long term (current) use of anticoagulants: Secondary | ICD-10-CM | POA: Diagnosis not present

## 2020-10-08 DIAGNOSIS — F5101 Primary insomnia: Secondary | ICD-10-CM | POA: Diagnosis not present

## 2020-10-08 DIAGNOSIS — F3132 Bipolar disorder, current episode depressed, moderate: Secondary | ICD-10-CM | POA: Diagnosis not present

## 2020-10-09 DIAGNOSIS — R600 Localized edema: Secondary | ICD-10-CM | POA: Diagnosis not present

## 2020-10-09 DIAGNOSIS — R062 Wheezing: Secondary | ICD-10-CM | POA: Diagnosis not present

## 2020-10-09 DIAGNOSIS — Z87440 Personal history of urinary (tract) infections: Secondary | ICD-10-CM | POA: Diagnosis not present

## 2020-10-09 DIAGNOSIS — N898 Other specified noninflammatory disorders of vagina: Secondary | ICD-10-CM | POA: Diagnosis not present

## 2020-10-09 DIAGNOSIS — J302 Other seasonal allergic rhinitis: Secondary | ICD-10-CM | POA: Diagnosis not present

## 2020-10-09 DIAGNOSIS — B373 Candidiasis of vulva and vagina: Secondary | ICD-10-CM | POA: Diagnosis not present

## 2020-10-10 DIAGNOSIS — Z79899 Other long term (current) drug therapy: Secondary | ICD-10-CM | POA: Diagnosis not present

## 2020-10-10 DIAGNOSIS — N390 Urinary tract infection, site not specified: Secondary | ICD-10-CM | POA: Diagnosis not present

## 2020-10-12 DIAGNOSIS — Z952 Presence of prosthetic heart valve: Secondary | ICD-10-CM | POA: Diagnosis not present

## 2020-10-12 DIAGNOSIS — Z7901 Long term (current) use of anticoagulants: Secondary | ICD-10-CM | POA: Diagnosis not present

## 2020-10-12 DIAGNOSIS — I10 Essential (primary) hypertension: Secondary | ICD-10-CM | POA: Diagnosis not present

## 2020-10-16 ENCOUNTER — Ambulatory Visit (INDEPENDENT_AMBULATORY_CARE_PROVIDER_SITE_OTHER): Payer: HMO | Admitting: Internal Medicine

## 2020-10-16 ENCOUNTER — Encounter: Payer: Self-pay | Admitting: Internal Medicine

## 2020-10-16 ENCOUNTER — Other Ambulatory Visit: Payer: Self-pay

## 2020-10-16 VITALS — BP 138/68 | HR 60 | Ht 68.0 in | Wt 209.0 lb

## 2020-10-16 DIAGNOSIS — N1831 Chronic kidney disease, stage 3a: Secondary | ICD-10-CM | POA: Diagnosis not present

## 2020-10-16 DIAGNOSIS — Z950 Presence of cardiac pacemaker: Secondary | ICD-10-CM

## 2020-10-16 DIAGNOSIS — Z952 Presence of prosthetic heart valve: Secondary | ICD-10-CM | POA: Diagnosis not present

## 2020-10-16 DIAGNOSIS — E785 Hyperlipidemia, unspecified: Secondary | ICD-10-CM | POA: Diagnosis not present

## 2020-10-16 DIAGNOSIS — I480 Paroxysmal atrial fibrillation: Secondary | ICD-10-CM | POA: Diagnosis not present

## 2020-10-16 DIAGNOSIS — I152 Hypertension secondary to endocrine disorders: Secondary | ICD-10-CM | POA: Diagnosis not present

## 2020-10-16 DIAGNOSIS — I495 Sick sinus syndrome: Secondary | ICD-10-CM | POA: Diagnosis not present

## 2020-10-16 DIAGNOSIS — E1159 Type 2 diabetes mellitus with other circulatory complications: Secondary | ICD-10-CM | POA: Diagnosis not present

## 2020-10-16 DIAGNOSIS — E039 Hypothyroidism, unspecified: Secondary | ICD-10-CM | POA: Diagnosis not present

## 2020-10-16 DIAGNOSIS — R7401 Elevation of levels of liver transaminase levels: Secondary | ICD-10-CM | POA: Diagnosis not present

## 2020-10-16 DIAGNOSIS — Z7901 Long term (current) use of anticoagulants: Secondary | ICD-10-CM | POA: Diagnosis not present

## 2020-10-16 DIAGNOSIS — D649 Anemia, unspecified: Secondary | ICD-10-CM | POA: Diagnosis not present

## 2020-10-16 DIAGNOSIS — N898 Other specified noninflammatory disorders of vagina: Secondary | ICD-10-CM | POA: Diagnosis not present

## 2020-10-16 NOTE — Progress Notes (Signed)
HPI Mrs. Bartles returns today for ongoing evaluation of persistent atrial fib. She is a pleasant 70 yo woman with bipolar disorder, AI, s/p AVR, sinus node dysfunction and prolongation of the QT due to taking multiple meds for her bipolar disorder. She notes that her bipolar is well controlled on her QT prolonging meds. With symptomatic tachy-brady, she underwent PPM insertion approx 3 months ago. In the interim, she notes no pain from her device. She is still weak but denies palpitations. She developed atrial fib and underwent DDCV about 6 months ago and has remained in NSR. She has unfortunately had multiple falls/subdural hematoma's but has recovered remarkably well. She has not had syncope. There was a question of whether the amiodarone that she had been taking was contributing to her dizziness and propensity to fall and it was stopped about 3 months ago.  Allergies  Allergen Reactions  . Mucinex [Guaifenesin Er] Other (See Comments)    Severe headaches   . Keflex [Cephalexin] Other (See Comments)    Headaches and dizziness  . Keflex [Cephalexin] Other (See Comments)    Headaches and/or dizziness- "allergic," per MAR  . Mucinex [Guaifenesin Er] Other (See Comments)    Severe headaches- "allergic," per MAR  . Sulfa Antibiotics Other (See Comments)    headaches  . Sulfa Antibiotics Other (See Comments)    Headaches- "allergic," per MAR   . Sulfonamide Derivatives Other (See Comments)    Headaches      Current Outpatient Medications  Medication Sig Dispense Refill  . acetaminophen (TYLENOL) 325 MG tablet Take 325 mg by mouth every 4 (four) hours as needed for mild pain.    Marland Kitchen acetaminophen (TYLENOL) 650 MG CR tablet Take 1,300 mg by mouth every 8 (eight) hours as needed for pain.    Marland Kitchen amiodarone (PACERONE) 200 MG tablet Take 1 tablet (200 mg total) by mouth daily. 90 tablet 3  . amoxicillin (AMOXIL) 500 MG capsule Take 1 capsule (500 mg total) by mouth 3 (three) times daily. 15  capsule 0  . aspirin EC 81 MG tablet Take 81 mg by mouth daily.    . butalbital-acetaminophen-caffeine (FIORICET) 50-325-40 MG tablet Take 1 tablet by mouth as needed for migraine.    . Cholecalciferol (VITAMIN D3) 1.25 MG (50000 UT) TABS Take 1 tablet by mouth daily.    Marland Kitchen docusate sodium (COLACE) 100 MG capsule Take 1 capsule (100 mg total) by mouth 2 (two) times daily. 10 capsule 0  . fenofibrate 160 MG tablet Take 1 tablet (160 mg total) by mouth daily.    . furosemide (LASIX) 40 MG tablet Take 1 tablet (40 mg total) by mouth daily. 90 tablet 3  . glucose blood (ONETOUCH VERIO) test strip Check blood sugars once daily Dx: E11.9 100 each 12  . insulin aspart (NOVOLOG) 100 UNIT/ML injection Inject 0-12 Units into the skin See admin instructions. Inject 0-12 units into the skin three times a day before meals, per sliding scale: BGL <70 = CALL MD; 70-200 = give nothing; 201-250 = 2 units; 251-300 = 4 units; 301-350 = 6 units; 351-400 = 8 units; 401-450 = 10 units; 451-600 = 12 units; >450 = 12 units and re-check in two hours- call MD if still elevated    . lamoTRIgine (LAMICTAL) 100 MG tablet Take 100 mg by mouth in the morning and 200 mg by mouth at bedtime 270 tablet 1  . lamoTRIgine (LAMICTAL) 100 MG tablet Take 1 tablet (100 mg total) by  mouth in the morning. 30 tablet 2  . lamoTRIgine (LAMICTAL) 100 MG tablet Take 2 tablets (200 mg total) by mouth every evening. 30 tablet 2  . Lancets (ONETOUCH ULTRASOFT) lancets Use as instructed to test blood sugars once daily Dx. E11.65 100 each 12  . levothyroxine (SYNTHROID) 25 MCG tablet TAKE 1 TABLET BY MOUTH DAILY BEFORE BREAKFAST. 90 tablet 0  . metoprolol succinate (TOPROL XL) 25 MG 24 hr tablet Take 0.5 tablets (12.5 mg total) by mouth daily. 90 tablet 1  . metoprolol tartrate (LOPRESSOR) 25 MG tablet Take 0.5 tablets (12.5 mg total) by mouth 2 (two) times daily.    Marland Kitchen nystatin cream (MYCOSTATIN) Apply 1 application topically 2 (two) times daily. 30 g 1   . OXcarbazepine (TRILEPTAL) 150 MG tablet Take 1 tablet (150 mg total) by mouth 2 (two) times daily.    . pantoprazole (PROTONIX) 40 MG tablet Take 1 tablet (40 mg total) by mouth daily.    . polyvinyl alcohol (LIQUIFILM TEARS) 1.4 % ophthalmic solution Place 1 drop into both eyes every 4 (four) hours as needed for dry eyes.    Marland Kitchen QUEtiapine (SEROQUEL XR) 200 MG 24 hr tablet Take 2 tablets (400 mg total) by mouth every evening. 60 tablet 1  . QUEtiapine (SEROQUEL) 25 MG tablet Take 25 mg by mouth at bedtime.    . rosuvastatin (CRESTOR) 40 MG tablet Take 40 mg by mouth daily.    Marland Kitchen saccharomyces boulardii (FLORASTOR) 250 MG capsule Take 1 capsule (250 mg total) by mouth daily.    . SUMAtriptan (IMITREX) 50 MG tablet TAKE ONE TABLET EVERY 2 HOURS AS NEEDED FOR MIGRAINE. MAY REPEAT IN 2 HOURS IF HEADACHE PERSISTS OR RECURS. (MAX OF 2 TABLETS IN 24 HOURS) 10 tablet 0  . SUMAtriptan (IMITREX) 50 MG tablet Take 1 tablet (50 mg total) by mouth every 2 (two) hours as needed for migraine. May repeat in 2 hours if headache persists or recurs. 10 tablet 0  . traZODone (DESYREL) 50 MG tablet Take 50 mg by mouth at bedtime.    Marland Kitchen venlafaxine XR (EFFEXOR XR) 75 MG 24 hr capsule Take 1 capsule (75 mg total) by mouth daily. 30 capsule 2  . vitamin B-12 1000 MCG tablet Take 1 tablet (1,000 mcg total) by mouth daily. 30 tablet 0  . warfarin (COUMADIN) 1 MG tablet Take 1 tablet by mouth daily. Use as directed    . warfarin (COUMADIN) 3 MG tablet Take 3 mg by mouth daily.    Marland Kitchen warfarin (COUMADIN) 4 MG tablet Take 4 mg by mouth daily. Use as directed    . warfarin (COUMADIN) 5 MG tablet Take as directed by [facility]. 110 tablet 1  . venlafaxine XR (EFFEXOR XR) 75 MG 24 hr capsule Take 1 capsule (75 mg total) by mouth daily with breakfast. 90 capsule 1   No current facility-administered medications for this visit.     Past Medical History:  Diagnosis Date  . ANEMIA 05/28/2010  . AORTIC VALVE REPLACEMENT, HX OF  03/11/2010   Mechanical prosthesis  . BIPOLAR AFFECTIVE DISORDER 03/11/2010  . Depression   . Diverticulitis 12/18/2010  . FIBROIDS, UTERUS 03/11/2010  . Gout 09/04/2016  . Grave's disease 8-12  . Hyperlipidemia associated with type 2 diabetes mellitus ([place]) 06/05/2016  . Hypertension   . Low back pain 11/02/2017  . Migraine 06/05/2016  . Mixed hyperlipidemia 03/11/2010  . Obesity   . Skin cancer   . Syncope 08/22/2019  . UTI (lower  urinary tract infection) 08/14/2011    ROS:   All systems reviewed and negative except as noted in the HPI.   Past Surgical History:  Procedure Laterality Date  . ABDOMINAL HYSTERECTOMY  ?1998   sb/l spo, total for fibroids/heavy bleeding  . ANKLE SURGERY Left 01/2018   hardware removal  . AORTIC VALVE REPLACEMENT  2011   Mechanical prosthesis, [employer]  . APPENDECTOMY     Required revision for EColi infection, required recurrent packing  . bowel obstruction     Requiring adhesions to be removed  . CARDIOVERSION N/A 05/09/2020   Procedure: CARDIOVERSION;  Surgeon: [name], MD;  Location: [facility];  Service: Cardiovascular;  Laterality: N/A;  . CHOLECYSTECTOMY    . COLONOSCOPY WITH PROPOFOL N/A 11/01/2015   Procedure: COLONOSCOPY WITH PROPOFOL;  Surgeon: Milus Banister, MD;  Location: [facility];  Service: Endoscopy;  Laterality: N/A;  . CYSTOCELE REPAIR N/A 10/16/2015   Procedure: ANTERIOR REPAIR (CYSTOCELE);  Surgeon: Donnamae Jude, MD;  Location: [facility];  Service: Gynecology;  Laterality: N/A;  . HERNIA REPAIR  08-16-10  . LEFT HEART CATHETERIZATION WITH CORONARY ANGIOGRAM N/A 12/05/2011   Procedure: LEFT HEART CATHETERIZATION WITH CORONARY ANGIOGRAM;  Surgeon: Sinclair Grooms, MD;  Location: [facility];  Service: Cardiovascular;  Laterality: N/A;  . PACEMAKER IMPLANT N/A 01/09/2020   Procedure: PACEMAKER IMPLANT;  Surgeon: Evans Lance, MD;  Location: [facility];  Service: Cardiovascular;  Laterality: N/A;  . TOTAL  KNEE ARTHROPLASTY Right 02/24/2013   Procedure: RIGHT TOTAL KNEE ARTHROPLASTY;  Surgeon: Johnn Hai, MD;  Location: [facility];  Service: Orthopedics;  Laterality: Right;  . TUBAL LIGATION    . varicose vein surgery b/l legs       Family History  Problem Relation Age of Onset  . Scoliosis Mother   . Arthritis Mother        Rheumatoid  . Aneurysm Mother        heart  . Alcohol abuse Father   . Depression Sister   . Depression Brother   . Alcohol abuse Brother   . Cancer Maternal Grandmother        colon  . Diabetes Maternal Grandfather   . Heart disease Maternal Grandfather   . Alcohol abuse Maternal Grandfather   . Depression Paternal Grandmother   . Diabetes Paternal Grandmother   . Depression Paternal Grandfather   . Diabetes Paternal Grandfather   . Depression Sister   . Alcohol abuse Brother   . Depression Brother   . Heart disease Brother      Social History   Socioeconomic History  . Marital status: Married    Spouse name: Not on file  . Number of children: Not on file  . Years of education: Not on file  . Highest education level: Not on file  Occupational History  . Not on file  Tobacco Use  . Smoking status: Former Smoker    Quit date: 05/26/1990    Years since quitting: 30.4  . Smokeless tobacco: Never Used  Vaping Use  . Vaping Use: Never used  Substance and Sexual Activity  . Alcohol use: Yes    Alcohol/week: 0.0 standard drinks    Comment: rare  . Drug use: No  . Sexual activity: Not on file  Other Topics Concern  . Not on file  Social History Narrative   ** Merged History Encounter **       Social Determinants of Health   Financial  Resource Strain: Not on file  Food Insecurity: Not on file  Transportation Needs: Not on file  Physical Activity: Not on file  Stress: Not on file  Social Connections: Not on file  Intimate Partner Violence: Not on file     BP 138/68   Pulse 60   Ht 5\' 8"  (1.727 m)   Wt 209 lb (94.8 kg)   SpO2 96%    BMI 31.78 kg/m   Physical Exam:  stble appearing 70 yo woman, NAD HEENT: Unremarkable Neck:  6 cm JVD, no thyromegally Lymphatics:  No adenopathy Back:  No CVA tenderness Lungs:  Clear with no wheezes HEART:  Regular rate KCLEXN,1/7 systolic murmurs, no rubs, no clicks, mechanical S2.  Abd:  soft, positive bowel sounds, no organomegally, no rebound, no guarding Ext:  2 plus pulses, no edema, no cyanosis, no clubbing Skin:  No rashes no nodules Neuro:  CN II through XII intact, motor grossly intact  EKG - nsr with atrial pacing  DEVICE  Normal device function.  See PaceArt for details.   Assess/Plan: 1. Sinus node dysfunction - she is asymptomatic s/p PPM insertion. 2. Persistent atrial fib - she is s/p DCCV and has maintained NSR very nicely on amiodarone. However the amiodarone has been stopped. She will undergo watchful waiting. Her QT is not too long today. If we needed we could consider dofetilide perhaps with modification of her neuro meds. 3. PPM - her [employer] DDD PM is working normally. 4. AVR - she is s/p AVR due to AI. Her valve appears to be working normally.  Carleene Overlie Benno Brensinger,MD

## 2020-10-16 NOTE — Patient Instructions (Addendum)
Medication Instructions:  Your physician recommends that you continue on your current medications as directed. Please refer to the Current Medication list given to you today.  Labwork: None ordered.  Testing/Procedures: None ordered.  Follow-Up: Your physician wants you to follow-up in: 6 months with Cristopher Peru, MD or one of the following Advanced Practice Providers on your designated Care Team:    Chanetta Marshall, NP  Tommye Standard, PA-C  Legrand Como "Jonni Sanger" [place], [place]  Remote monitoring is used to monitor your Pacemaker from home. This monitoring reduces the number of office visits required to check your device to one time per year. It allows [place] to keep an eye on the functioning of your device to ensure it is working properly. You are scheduled for a device check from home on 11/13/2020. You may send your transmission at any time that day. If you have a wireless device, the transmission will be sent automatically. After your physician reviews your transmission, you will receive a postcard with your next transmission date.  Any Other Special Instructions Will Be Listed Below (If Applicable).  If you need a refill on your cardiac medications before your next appointment, please call your pharmacy.

## 2020-10-18 DIAGNOSIS — D510 Vitamin B12 deficiency anemia due to intrinsic factor deficiency: Secondary | ICD-10-CM | POA: Diagnosis not present

## 2020-10-18 DIAGNOSIS — E785 Hyperlipidemia, unspecified: Secondary | ICD-10-CM | POA: Diagnosis not present

## 2020-10-18 DIAGNOSIS — N179 Acute kidney failure, unspecified: Secondary | ICD-10-CM | POA: Diagnosis not present

## 2020-10-18 DIAGNOSIS — N1831 Chronic kidney disease, stage 3a: Secondary | ICD-10-CM | POA: Diagnosis not present

## 2020-10-23 DIAGNOSIS — D649 Anemia, unspecified: Secondary | ICD-10-CM | POA: Diagnosis not present

## 2020-10-23 DIAGNOSIS — N760 Acute vaginitis: Secondary | ICD-10-CM | POA: Diagnosis not present

## 2020-10-23 DIAGNOSIS — N1831 Chronic kidney disease, stage 3a: Secondary | ICD-10-CM | POA: Diagnosis not present

## 2020-10-23 DIAGNOSIS — R031 Nonspecific low blood-pressure reading: Secondary | ICD-10-CM | POA: Diagnosis not present

## 2020-10-24 DIAGNOSIS — Z7901 Long term (current) use of anticoagulants: Secondary | ICD-10-CM | POA: Diagnosis not present

## 2020-10-24 DIAGNOSIS — R278 Other lack of coordination: Secondary | ICD-10-CM | POA: Diagnosis not present

## 2020-10-24 DIAGNOSIS — R2681 Unsteadiness on feet: Secondary | ICD-10-CM | POA: Diagnosis not present

## 2020-10-24 DIAGNOSIS — D510 Vitamin B12 deficiency anemia due to intrinsic factor deficiency: Secondary | ICD-10-CM | POA: Diagnosis not present

## 2020-10-24 DIAGNOSIS — R41841 Cognitive communication deficit: Secondary | ICD-10-CM | POA: Diagnosis not present

## 2020-10-24 DIAGNOSIS — M6281 Muscle weakness (generalized): Secondary | ICD-10-CM | POA: Diagnosis not present

## 2020-10-24 DIAGNOSIS — S066X9D Traumatic subarachnoid hemorrhage with loss of consciousness of unspecified duration, subsequent encounter: Secondary | ICD-10-CM | POA: Diagnosis not present

## 2020-10-24 DIAGNOSIS — R262 Difficulty in walking, not elsewhere classified: Secondary | ICD-10-CM | POA: Diagnosis not present

## 2020-10-24 DIAGNOSIS — Z9181 History of falling: Secondary | ICD-10-CM | POA: Diagnosis not present

## 2020-10-24 DIAGNOSIS — E785 Hyperlipidemia, unspecified: Secondary | ICD-10-CM | POA: Diagnosis not present

## 2020-10-24 DIAGNOSIS — R2689 Other abnormalities of gait and mobility: Secondary | ICD-10-CM | POA: Diagnosis not present

## 2020-10-24 DIAGNOSIS — Z79899 Other long term (current) drug therapy: Secondary | ICD-10-CM | POA: Diagnosis not present

## 2020-10-29 DIAGNOSIS — I152 Hypertension secondary to endocrine disorders: Secondary | ICD-10-CM | POA: Diagnosis not present

## 2020-10-29 DIAGNOSIS — R600 Localized edema: Secondary | ICD-10-CM | POA: Diagnosis not present

## 2020-10-29 DIAGNOSIS — D6489 Other specified anemias: Secondary | ICD-10-CM | POA: Diagnosis not present

## 2020-10-29 DIAGNOSIS — N1831 Chronic kidney disease, stage 3a: Secondary | ICD-10-CM | POA: Diagnosis not present

## 2020-11-01 DIAGNOSIS — I10 Essential (primary) hypertension: Secondary | ICD-10-CM | POA: Diagnosis not present

## 2020-11-01 DIAGNOSIS — R0602 Shortness of breath: Secondary | ICD-10-CM | POA: Diagnosis not present

## 2020-11-01 DIAGNOSIS — I480 Paroxysmal atrial fibrillation: Secondary | ICD-10-CM | POA: Diagnosis not present

## 2020-11-02 DIAGNOSIS — R0602 Shortness of breath: Secondary | ICD-10-CM | POA: Diagnosis not present

## 2020-11-06 DIAGNOSIS — R0602 Shortness of breath: Secondary | ICD-10-CM | POA: Diagnosis not present

## 2020-11-12 DIAGNOSIS — F5101 Primary insomnia: Secondary | ICD-10-CM | POA: Diagnosis not present

## 2020-11-12 DIAGNOSIS — F3132 Bipolar disorder, current episode depressed, moderate: Secondary | ICD-10-CM | POA: Diagnosis not present

## 2020-11-13 ENCOUNTER — Ambulatory Visit (INDEPENDENT_AMBULATORY_CARE_PROVIDER_SITE_OTHER): Payer: HMO

## 2020-11-13 DIAGNOSIS — I495 Sick sinus syndrome: Secondary | ICD-10-CM

## 2020-11-14 LAB — CUP PACEART REMOTE DEVICE CHECK
Battery Remaining Longevity: 168 mo
Battery Remaining Percentage: 100 %
Brady Statistic RA Percent Paced: 46 %
Brady Statistic RV Percent Paced: 8 %
Date Time Interrogation Session: 20220622023100
Implantable Lead Implant Date: 20210816
Implantable Lead Implant Date: 20210816
Implantable Lead Location: 753859
Implantable Lead Location: 753860
Implantable Lead Model: 7842
Implantable Lead Model: 7842
Implantable Lead Serial Number: 1013287
Implantable Lead Serial Number: 1091261
Implantable Pulse Generator Implant Date: 20210816
Lead Channel Impedance Value: 453 Ohm
Lead Channel Impedance Value: 644 Ohm
Lead Channel Setting Pacing Amplitude: 2.5 V
Lead Channel Setting Pacing Amplitude: 2.5 V
Lead Channel Setting Pacing Pulse Width: 0.4 ms
Lead Channel Setting Sensing Sensitivity: 2.5 mV
Pulse Gen Serial Number: 940432

## 2020-11-15 DIAGNOSIS — R058 Other specified cough: Secondary | ICD-10-CM | POA: Diagnosis not present

## 2020-11-15 DIAGNOSIS — N182 Chronic kidney disease, stage 2 (mild): Secondary | ICD-10-CM | POA: Diagnosis not present

## 2020-11-15 DIAGNOSIS — E038 Other specified hypothyroidism: Secondary | ICD-10-CM | POA: Diagnosis not present

## 2020-11-15 DIAGNOSIS — G478 Other sleep disorders: Secondary | ICD-10-CM | POA: Diagnosis not present

## 2020-11-15 DIAGNOSIS — M25439 Effusion, unspecified wrist: Secondary | ICD-10-CM | POA: Diagnosis not present

## 2020-11-19 ENCOUNTER — Other Ambulatory Visit: Payer: Self-pay

## 2020-11-20 DIAGNOSIS — Z029 Encounter for administrative examinations, unspecified: Secondary | ICD-10-CM | POA: Diagnosis not present

## 2020-11-20 DIAGNOSIS — R0989 Other specified symptoms and signs involving the circulatory and respiratory systems: Secondary | ICD-10-CM | POA: Diagnosis not present

## 2020-11-21 DIAGNOSIS — N390 Urinary tract infection, site not specified: Secondary | ICD-10-CM | POA: Diagnosis not present

## 2020-11-22 DIAGNOSIS — R399 Unspecified symptoms and signs involving the genitourinary system: Secondary | ICD-10-CM | POA: Diagnosis not present

## 2020-11-22 DIAGNOSIS — Z5181 Encounter for therapeutic drug level monitoring: Secondary | ICD-10-CM | POA: Diagnosis not present

## 2020-11-23 DIAGNOSIS — N390 Urinary tract infection, site not specified: Secondary | ICD-10-CM | POA: Diagnosis not present

## 2020-11-27 DIAGNOSIS — N390 Urinary tract infection, site not specified: Secondary | ICD-10-CM | POA: Diagnosis not present

## 2020-11-27 DIAGNOSIS — Z5181 Encounter for therapeutic drug level monitoring: Secondary | ICD-10-CM | POA: Diagnosis not present

## 2020-11-27 DIAGNOSIS — R031 Nonspecific low blood-pressure reading: Secondary | ICD-10-CM | POA: Diagnosis not present

## 2020-11-30 DIAGNOSIS — R278 Other lack of coordination: Secondary | ICD-10-CM | POA: Diagnosis not present

## 2020-11-30 DIAGNOSIS — R41841 Cognitive communication deficit: Secondary | ICD-10-CM | POA: Diagnosis not present

## 2020-11-30 DIAGNOSIS — S066X9D Traumatic subarachnoid hemorrhage with loss of consciousness of unspecified duration, subsequent encounter: Secondary | ICD-10-CM | POA: Diagnosis not present

## 2020-11-30 DIAGNOSIS — M6281 Muscle weakness (generalized): Secondary | ICD-10-CM | POA: Diagnosis not present

## 2020-11-30 DIAGNOSIS — R2689 Other abnormalities of gait and mobility: Secondary | ICD-10-CM | POA: Diagnosis not present

## 2020-11-30 DIAGNOSIS — R1312 Dysphagia, oropharyngeal phase: Secondary | ICD-10-CM | POA: Diagnosis not present

## 2020-11-30 DIAGNOSIS — R2681 Unsteadiness on feet: Secondary | ICD-10-CM | POA: Diagnosis not present

## 2020-11-30 DIAGNOSIS — R262 Difficulty in walking, not elsewhere classified: Secondary | ICD-10-CM | POA: Diagnosis not present

## 2020-11-30 DIAGNOSIS — Z9181 History of falling: Secondary | ICD-10-CM | POA: Diagnosis not present

## 2020-11-30 DIAGNOSIS — K219 Gastro-esophageal reflux disease without esophagitis: Secondary | ICD-10-CM | POA: Diagnosis not present

## 2020-12-03 NOTE — Progress Notes (Signed)
Remote pacemaker transmission.   

## 2020-12-04 DIAGNOSIS — I480 Paroxysmal atrial fibrillation: Secondary | ICD-10-CM | POA: Diagnosis not present

## 2020-12-04 DIAGNOSIS — Z952 Presence of prosthetic heart valve: Secondary | ICD-10-CM | POA: Diagnosis not present

## 2020-12-04 DIAGNOSIS — Z5181 Encounter for therapeutic drug level monitoring: Secondary | ICD-10-CM | POA: Diagnosis not present

## 2020-12-10 DIAGNOSIS — F5101 Primary insomnia: Secondary | ICD-10-CM | POA: Diagnosis not present

## 2020-12-10 DIAGNOSIS — F3132 Bipolar disorder, current episode depressed, moderate: Secondary | ICD-10-CM | POA: Diagnosis not present

## 2020-12-13 DIAGNOSIS — R0982 Postnasal drip: Secondary | ICD-10-CM | POA: Diagnosis not present

## 2020-12-13 DIAGNOSIS — Z9189 Other specified personal risk factors, not elsewhere classified: Secondary | ICD-10-CM | POA: Diagnosis not present

## 2020-12-13 DIAGNOSIS — R791 Abnormal coagulation profile: Secondary | ICD-10-CM | POA: Diagnosis not present

## 2020-12-24 DIAGNOSIS — F3132 Bipolar disorder, current episode depressed, moderate: Secondary | ICD-10-CM | POA: Diagnosis not present

## 2020-12-24 DIAGNOSIS — Z5181 Encounter for therapeutic drug level monitoring: Secondary | ICD-10-CM | POA: Diagnosis not present

## 2020-12-24 DIAGNOSIS — Z7901 Long term (current) use of anticoagulants: Secondary | ICD-10-CM | POA: Diagnosis not present

## 2020-12-24 DIAGNOSIS — F5101 Primary insomnia: Secondary | ICD-10-CM | POA: Diagnosis not present

## 2020-12-25 DIAGNOSIS — Z9181 History of falling: Secondary | ICD-10-CM | POA: Diagnosis not present

## 2020-12-25 DIAGNOSIS — K219 Gastro-esophageal reflux disease without esophagitis: Secondary | ICD-10-CM | POA: Diagnosis not present

## 2020-12-25 DIAGNOSIS — M6281 Muscle weakness (generalized): Secondary | ICD-10-CM | POA: Diagnosis not present

## 2020-12-25 DIAGNOSIS — R262 Difficulty in walking, not elsewhere classified: Secondary | ICD-10-CM | POA: Diagnosis not present

## 2020-12-25 DIAGNOSIS — R2681 Unsteadiness on feet: Secondary | ICD-10-CM | POA: Diagnosis not present

## 2020-12-25 DIAGNOSIS — S066X9D Traumatic subarachnoid hemorrhage with loss of consciousness of unspecified duration, subsequent encounter: Secondary | ICD-10-CM | POA: Diagnosis not present

## 2020-12-25 DIAGNOSIS — R2689 Other abnormalities of gait and mobility: Secondary | ICD-10-CM | POA: Diagnosis not present

## 2020-12-25 DIAGNOSIS — R41841 Cognitive communication deficit: Secondary | ICD-10-CM | POA: Diagnosis not present

## 2020-12-25 DIAGNOSIS — R1312 Dysphagia, oropharyngeal phase: Secondary | ICD-10-CM | POA: Diagnosis not present

## 2020-12-25 DIAGNOSIS — R278 Other lack of coordination: Secondary | ICD-10-CM | POA: Diagnosis not present

## 2020-12-27 DIAGNOSIS — R609 Edema, unspecified: Secondary | ICD-10-CM | POA: Diagnosis not present

## 2020-12-27 DIAGNOSIS — Z8679 Personal history of other diseases of the circulatory system: Secondary | ICD-10-CM | POA: Diagnosis not present

## 2020-12-27 DIAGNOSIS — I4892 Unspecified atrial flutter: Secondary | ICD-10-CM | POA: Diagnosis not present

## 2020-12-27 DIAGNOSIS — F319 Bipolar disorder, unspecified: Secondary | ICD-10-CM | POA: Diagnosis not present

## 2020-12-27 DIAGNOSIS — I4891 Unspecified atrial fibrillation: Secondary | ICD-10-CM | POA: Diagnosis not present

## 2020-12-27 DIAGNOSIS — E119 Type 2 diabetes mellitus without complications: Secondary | ICD-10-CM | POA: Diagnosis not present

## 2020-12-27 DIAGNOSIS — Z9189 Other specified personal risk factors, not elsewhere classified: Secondary | ICD-10-CM | POA: Diagnosis not present

## 2020-12-27 DIAGNOSIS — D649 Anemia, unspecified: Secondary | ICD-10-CM | POA: Diagnosis not present

## 2020-12-27 DIAGNOSIS — I951 Orthostatic hypotension: Secondary | ICD-10-CM | POA: Diagnosis not present

## 2020-12-27 DIAGNOSIS — E039 Hypothyroidism, unspecified: Secondary | ICD-10-CM | POA: Diagnosis not present

## 2020-12-31 DIAGNOSIS — Z5181 Encounter for therapeutic drug level monitoring: Secondary | ICD-10-CM | POA: Diagnosis not present

## 2021-01-01 NOTE — Progress Notes (Signed)
Cardiology Office Note:    Date:  01/02/2021   ID:  [name], DOB [birth year], MRN [id number]  PCP:  Mosie Lukes, MD  Cardiologist:  Sinclair Grooms, MD   Referring MD: Mosie Lukes, MD   Chief Complaint  Patient presents with   Cardiac Valve Problem    Mechanical aortic valve thank you   Atrial Fibrillation     History of Present Illness:    [name] is a 70 y.o. female with a hx of post aortic valve replacement with a mechanical prosthesis 2011, history of recurrent atrial fibrillation, hyperlipidemia, diabetes mellitus type 2, and chronic anticoagulation therapy with Coumadin, and chronic diastolic HF.  She was having uncontrolled atrial fibrillation and atrial flutter, having bradycardia as well, underwent pacemaker implantation, amiodarone loading, and cardioversion successfully.   She is accompanied by daughter.  Since I last saw her she has been transferred to an assisted living facility because starting in December she had multiple falls 2 of which required craniotomy for subdural hematoma.  She has now mostly wheelchair-bound.  She has terrible difficulty with ambulation and balance.  From the cardiac viewpoint, she is tolerating Coumadin therapy without bleeding other than associated with trauma episodes as noted above.    Past Medical History:  Diagnosis Date   ANEMIA 05/28/2010   AORTIC VALVE REPLACEMENT, HX OF 03/11/2010   Mechanical prosthesis   BIPOLAR AFFECTIVE DISORDER 03/11/2010   Depression    Diverticulitis 12/18/2010   FIBROIDS, UTERUS 03/11/2010   Gout 09/04/2016   Grave's disease 8-12   Hyperlipidemia associated with type 2 diabetes mellitus ([place]) 06/05/2016   Hypertension    Low back pain 11/02/2017   Migraine 06/05/2016   Mixed hyperlipidemia 03/11/2010   Obesity    Skin cancer    Syncope 08/22/2019   UTI (lower urinary tract infection) 08/14/2011    Past Surgical History:  Procedure Laterality Date   ABDOMINAL HYSTERECTOMY  ?1998    sb/l spo, total for fibroids/heavy bleeding   ANKLE SURGERY Left 01/2018   hardware removal   AORTIC VALVE REPLACEMENT  2011   Mechanical prosthesis, [employer]   APPENDECTOMY     Required revision for EColi infection, required recurrent packing   bowel obstruction     Requiring adhesions to be removed   CARDIOVERSION N/A 05/09/2020   Procedure: CARDIOVERSION;  Surgeon: [name], MD;  Location: [facility];  Service: Cardiovascular;  Laterality: N/A;   CHOLECYSTECTOMY     COLONOSCOPY WITH PROPOFOL N/A 11/01/2015   Procedure: COLONOSCOPY WITH PROPOFOL;  Surgeon: Milus Banister, MD;  Location: [facility];  Service: Endoscopy;  Laterality: N/A;   CYSTOCELE REPAIR N/A 10/16/2015   Procedure: ANTERIOR REPAIR (CYSTOCELE);  Surgeon: Donnamae Jude, MD;  Location: [facility];  Service: Gynecology;  Laterality: N/A;   HERNIA REPAIR  08-16-10   LEFT HEART CATHETERIZATION WITH CORONARY ANGIOGRAM N/A 12/05/2011   Procedure: LEFT HEART CATHETERIZATION WITH CORONARY ANGIOGRAM;  Surgeon: Sinclair Grooms, MD;  Location: [facility];  Service: Cardiovascular;  Laterality: N/A;   PACEMAKER IMPLANT N/A 01/09/2020   Procedure: PACEMAKER IMPLANT;  Surgeon: Evans Lance, MD;  Location: [facility];  Service: Cardiovascular;  Laterality: N/A;   TOTAL KNEE ARTHROPLASTY Right 02/24/2013   Procedure: RIGHT TOTAL KNEE ARTHROPLASTY;  Surgeon: Johnn Hai, MD;  Location: [facility];  Service: Orthopedics;  Laterality: Right;   TUBAL LIGATION     varicose vein surgery b/l legs  Current Medications: Current Meds  Medication Sig   acetaminophen (TYLENOL) 325 MG tablet Take 325 mg by mouth every 4 (four) hours as needed for mild pain.   acetaminophen (TYLENOL) 650 MG CR tablet Take 1,300 mg by mouth every 8 (eight) hours as needed for pain.   amiodarone (PACERONE) 200 MG tablet Take 1 tablet (200 mg total) by mouth daily.   amoxicillin (AMOXIL) 500 MG capsule Take 1 capsule (500 mg total) by mouth  3 (three) times daily.   aspirin EC 81 MG tablet Take 81 mg by mouth daily.   Azelastine HCl 137 MCG/SPRAY SOLN Place into both nostrils.   butalbital-acetaminophen-caffeine (FIORICET) 50-325-40 MG tablet Take 1 tablet by mouth as needed for migraine.   carvedilol (COREG) 3.125 MG tablet Take 3.125 mg by mouth 2 (two) times daily.   Cholecalciferol (VITAMIN D3) 1.25 MG (50000 UT) TABS Take 1 tablet by mouth daily.   docusate sodium (COLACE) 100 MG capsule Take 1 capsule (100 mg total) by mouth 2 (two) times daily.   fenofibrate 160 MG tablet Take 1 tablet (160 mg total) by mouth daily.   furosemide (LASIX) 40 MG tablet Take 1 tablet (40 mg total) by mouth daily.   glucose blood (ONETOUCH VERIO) test strip Check blood sugars once daily Dx: E11.9   insulin aspart (NOVOLOG) 100 UNIT/ML injection Inject 0-12 Units into the skin See admin instructions. Inject 0-12 units into the skin three times a day before meals, per sliding scale: BGL <70 = CALL MD; 70-200 = give nothing; 201-250 = 2 units; 251-300 = 4 units; 301-350 = 6 units; 351-400 = 8 units; 401-450 = 10 units; 451-600 = 12 units; >450 = 12 units and re-check in two hours- call MD if still elevated   lamoTRIgine (LAMICTAL) 100 MG tablet Take 100 mg by mouth in the morning and 200 mg by mouth at bedtime   lamoTRIgine (LAMICTAL) 100 MG tablet Take 1 tablet (100 mg total) by mouth in the morning.   lamoTRIgine (LAMICTAL) 100 MG tablet Take 2 tablets (200 mg total) by mouth every evening.   lamoTRIgine (LAMICTAL) 200 MG tablet Take 200 mg by mouth daily.   Lancets (ONETOUCH ULTRASOFT) lancets Use as instructed to test blood sugars once daily Dx. E11.65   levothyroxine (SYNTHROID) 25 MCG tablet TAKE 1 TABLET BY MOUTH DAILY BEFORE BREAKFAST.   metoprolol succinate (TOPROL XL) 25 MG 24 hr tablet Take 0.5 tablets (12.5 mg total) by mouth daily.   metoprolol tartrate (LOPRESSOR) 25 MG tablet Take 0.5 tablets (12.5 mg total) by mouth 2 (two) times daily.    nystatin cream (MYCOSTATIN) Apply 1 application topically 2 (two) times daily.   OXcarbazepine (TRILEPTAL) 150 MG tablet Take 1 tablet (150 mg total) by mouth 2 (two) times daily.   pantoprazole (PROTONIX) 40 MG tablet Take 1 tablet (40 mg total) by mouth daily.   polyvinyl alcohol (LIQUIFILM TEARS) 1.4 % ophthalmic solution Place 1 drop into both eyes every 4 (four) hours as needed for dry eyes.   QUEtiapine (SEROQUEL XR) 200 MG 24 hr tablet Take 2 tablets (400 mg total) by mouth every evening.   QUEtiapine (SEROQUEL) 25 MG tablet Take 25 mg by mouth at bedtime.   rosuvastatin (CRESTOR) 40 MG tablet Take 40 mg by mouth daily.   saccharomyces boulardii (FLORASTOR) 250 MG capsule Take 1 capsule (250 mg total) by mouth daily.   SUMAtriptan (IMITREX) 50 MG tablet TAKE ONE TABLET EVERY 2 HOURS AS NEEDED FOR MIGRAINE. MAY  REPEAT IN 2 HOURS IF HEADACHE PERSISTS OR RECURS. (MAX OF 2 TABLETS IN 24 HOURS)   SUMAtriptan (IMITREX) 50 MG tablet Take 1 tablet (50 mg total) by mouth every 2 (two) hours as needed for migraine. May repeat in 2 hours if headache persists or recurs.   traZODone (DESYREL) 50 MG tablet Take 100 mg by mouth at bedtime.   venlafaxine XR (EFFEXOR XR) 75 MG 24 hr capsule Take 1 capsule (75 mg total) by mouth daily.   vitamin B-12 1000 MCG tablet Take 1 tablet (1,000 mcg total) by mouth daily.   warfarin (COUMADIN) 1 MG tablet Take 1 tablet by mouth daily. Use as directed   warfarin (COUMADIN) 3 MG tablet Take 3 mg by mouth daily.   warfarin (COUMADIN) 4 MG tablet Take 4 mg by mouth daily. Use as directed   warfarin (COUMADIN) 5 MG tablet Take as directed by [facility].     Allergies:   Mucinex [guaifenesin er], Keflex [cephalexin], Keflex [cephalexin], Mucinex [guaifenesin er], Sulfa antibiotics, Sulfa antibiotics, and Sulfonamide derivatives   Social History   Socioeconomic History   Marital status: Married    Spouse name: Not on file   Number of children: Not on file    Years of education: Not on file   Highest education level: Not on file  Occupational History   Not on file  Tobacco Use   Smoking status: Former    Types: Cigarettes    Quit date: 05/26/1990    Years since quitting: 30.6   Smokeless tobacco: Never  Vaping Use   Vaping Use: Never used  Substance and Sexual Activity   Alcohol use: Yes    Alcohol/week: 0.0 standard drinks    Comment: rare   Drug use: No   Sexual activity: Not on file  Other Topics Concern   Not on file  Social History Narrative   ** Merged History Encounter **       Social Determinants of Health   Financial Resource Strain: Not on file  Food Insecurity: Not on file  Transportation Needs: Not on file  Physical Activity: Not on file  Stress: Not on file  Social Connections: Not on file     Family History: The patient's family history includes Alcohol abuse in her brother, brother, father, and maternal grandfather; Aneurysm in her mother; Arthritis in her mother; Cancer in her maternal grandmother; Depression in her brother, brother, paternal grandfather, paternal grandmother, sister, and sister; Diabetes in her maternal grandfather, paternal grandfather, and paternal grandmother; Heart disease in her brother and maternal grandfather; Scoliosis in her mother.  ROS:   Please see the history of present illness.    Craniotomy secondary to falls and occurred in January 2022.  [facility] at [facility] and is wheelchair-bound.  Her appetite is good.  She otherwise feels that she is doing well.  She has no cardiac complaints.  All other systems reviewed and are negative.  EKGs/Labs/Other Studies Reviewed:    The following studies were reviewed today: 2D Doppler echocardiogram November 2021: IMPRESSIONS     1. Left ventricular ejection fraction, by estimation, is 50 to 55%. The  left ventricle has low normal function. Left ventricular endocardial  border not optimally defined to evaluate regional wall  motion. Left  ventricular diastolic parameters are  indeterminate.   2. Right ventricular systolic function is mildly reduced. The right  ventricular size is normal. Tricuspid regurgitation signal is inadequate  for assessing PA pressure.   3. The  mitral valve is degenerative. No evidence of mitral valve  regurgitation. No evidence of mitral stenosis. Moderate mitral annular  calcification.   4. The aortic valve has been repaired/replaced. Aortic valve  regurgitation is trivial. No aortic stenosis is present. Mechanical valve  leaflets not well visualized. There is a 25 mm [employer] Jude mechanical valved  conduit valve present in the aortic  position. Procedure Date: 05/22/2008. Echo findings are consistent with  normal structure and function of the aortic valve prosthesis. Aortic valve  mean gradient measures 7.0 mmHg.   5. Aortic root/ascending aorta has been repaired/replaced. Grossly normal  appearance in proximal segment, not well visualized beyond proximal  ascending aorta.   6. The inferior vena cava is normal in size with greater than 50%  respiratory variability, suggesting right atrial pressure of 3 mmHg.   Conclusion(s)/Recommendation(s): No intracardiac source of embolism  detected on this transthoracic study. A transesophageal echocardiogram is  recommended to exclude cardiac source of embolism if clinically indicated.     EKG:  EKG not performed  Recent Labs: 05/01/2020: Magnesium 2.1 06/26/2020: TSH 3.43 07/24/2020: ALT 32; Hemoglobin 9.5; Platelets 207 07/25/2020: BUN 20; Creatinine, Ser 1.33; Potassium 3.8; Sodium 137  Recent Lipid Panel    Component Value Date/Time   CHOL 147 07/24/2020 0358   TRIG 124 07/24/2020 0358   HDL 62 07/24/2020 0358   CHOLHDL 2.4 07/24/2020 0358   VLDL 25 07/24/2020 0358   LDLCALC 60 07/24/2020 0358   LDLDIRECT 131.8 (H) 08/23/2019 0312    Physical Exam:    VS:  BP 132/68   Pulse 68   Ht '5\' 8"'$  (1.727 m)   Wt 220 lb 9.6 oz (100.1 kg)    SpO2 96%   BMI 33.54 kg/m     Wt Readings from Last 3 Encounters:  01/02/21 220 lb 9.6 oz (100.1 kg)  10/16/20 209 lb (94.8 kg)  07/23/20 191 lb (86.6 kg)     GEN: Obese. No acute distress HEENT: Normal NECK: No JVD. LYMPHATICS: No lymphadenopathy CARDIAC: 1/6 systolic.  Mechanical valve closure sounds are heard.  No diastolic murmur. RRR no gallop, or edema. VASCULAR: Normal Pulses. No bruits. RESPIRATORY:  Clear to auscultation without rales, wheezing or rhonchi  ABDOMEN: Soft, non-tender, non-distended, No pulsatile mass, MUSCULOSKELETAL: No deformity  SKIN: Warm and dry NEUROLOGIC:  Alert and oriented x 3 PSYCHIATRIC:  Normal affect   ASSESSMENT:    1. Tachycardia-bradycardia syndrome ([place])   2. Paroxysmal atrial fibrillation ([place])   3. Hx of mechanical aortic valve replacement   4. Pacemaker   5. Chronic combined systolic and diastolic heart failure ([place])   6. Essential hypertension, benign   7. Long term current use of anticoagulant therapy    PLAN:    In order of problems listed above:  Permanent pacemaker functioning well.  The patient and the daughter state that since the pacemaker was placed episodes of rapid heart beating and near syncope have resolved. Continue Coumadin therapy Continue Coumadin therapy.  Last echocardiogram did not reveal structural abnormality.  It was done a year ago. Normal function when last evaluated Systolic function on echocardiogram done in November was 55%.  Controlling atrial fibrillation helped improve LV systolic function. Blood pressure is well controlled on current regimen.  Medication reconciliation is online.  She has a long list of medications some of which are duplicates .  Continue Coumadin therapy.   9-monthclinic appointment at the daughter's request who is responsible for bringing her back and forth.  Amiodarone therapy needs to be followed with twice yearly liver panel and TSH.   Medication Adjustments/Labs and  Tests Ordered: Current medicines are reviewed at length with the patient today.  Concerns regarding medicines are outlined above.  No orders of the defined types were placed in this encounter.  No orders of the defined types were placed in this encounter.   Patient Instructions  Medication Instructions:  Your physician recommends that you continue on your current medications as directed. Please refer to the Current Medication list given to you today.  *If you need a refill on your cardiac medications before your next appointment, please call your pharmacy*   Lab Work: None If you have labs (blood work) drawn today and your tests are completely normal, you will receive your results only by: [place] (if you have MyChart) OR A paper copy in the mail If you have any lab test that is abnormal or we need to change your treatment, we will call you to review the results.   Testing/Procedures: None   Follow-Up: At [facility], you and your health needs are our priority.  As part of our continuing mission to provide you with exceptional heart care, we have created designated Provider Care Teams.  These Care Teams include your primary Cardiologist (physician) and Advanced Practice Providers (APPs -  Physician Assistants and Nurse Practitioners) who all work together to provide you with the care you need, when you need it.  We recommend signing up for the patient portal called "MyChart".  Sign up information is provided on this After Visit Summary.  MyChart is used to connect with patients for Virtual Visits (Telemedicine).  Patients are able to view lab/test results, encounter notes, upcoming appointments, etc.  Non-urgent messages can be sent to your provider as well.   To learn more about what you can do with MyChart, go to [email or web address].    Your next appointment:   9 month(s)  The format for your next appointment:   In Person  Provider:   You may see Sinclair Grooms, MD or one of the following Advanced Practice Providers on your designated Care Team:   Cecilie Kicks, NP    Other Instructions     Signed, Sinclair Grooms, MD  01/02/2021 11:59 AM    [place]

## 2021-01-02 ENCOUNTER — Encounter: Payer: Self-pay | Admitting: Interventional Cardiology

## 2021-01-02 ENCOUNTER — Ambulatory Visit (INDEPENDENT_AMBULATORY_CARE_PROVIDER_SITE_OTHER): Payer: HMO | Admitting: Interventional Cardiology

## 2021-01-02 ENCOUNTER — Other Ambulatory Visit: Payer: Self-pay

## 2021-01-02 VITALS — BP 132/68 | HR 68 | Ht 68.0 in | Wt 220.6 lb

## 2021-01-02 DIAGNOSIS — Z952 Presence of prosthetic heart valve: Secondary | ICD-10-CM | POA: Diagnosis not present

## 2021-01-02 DIAGNOSIS — I5042 Chronic combined systolic (congestive) and diastolic (congestive) heart failure: Secondary | ICD-10-CM

## 2021-01-02 DIAGNOSIS — I495 Sick sinus syndrome: Secondary | ICD-10-CM

## 2021-01-02 DIAGNOSIS — I480 Paroxysmal atrial fibrillation: Secondary | ICD-10-CM

## 2021-01-02 DIAGNOSIS — Z7901 Long term (current) use of anticoagulants: Secondary | ICD-10-CM

## 2021-01-02 DIAGNOSIS — Z950 Presence of cardiac pacemaker: Secondary | ICD-10-CM

## 2021-01-02 DIAGNOSIS — I10 Essential (primary) hypertension: Secondary | ICD-10-CM

## 2021-01-02 NOTE — Patient Instructions (Signed)
Medication Instructions:  Your physician recommends that you continue on your current medications as directed. Please refer to the Current Medication list given to you today.  *If you need a refill on your cardiac medications before your next appointment, please call your pharmacy*   Lab Work: None If you have labs (blood work) drawn today and your tests are completely normal, you will receive your results only by: [place] (if you have MyChart) OR A paper copy in the mail If you have any lab test that is abnormal or we need to change your treatment, we will call you to review the results.   Testing/Procedures: None   Follow-Up: At [facility], you and your health needs are our priority.  As part of our continuing mission to provide you with exceptional heart care, we have created designated Provider Care Teams.  These Care Teams include your primary Cardiologist (physician) and Advanced Practice Providers (APPs -  Physician Assistants and Nurse Practitioners) who all work together to provide you with the care you need, when you need it.  We recommend signing up for the patient portal called "MyChart".  Sign up information is provided on this After Visit Summary.  MyChart is used to connect with patients for Virtual Visits (Telemedicine).  Patients are able to view lab/test results, encounter notes, upcoming appointments, etc.  Non-urgent messages can be sent to your provider as well.   To learn more about what you can do with MyChart, go to [email or web address].    Your next appointment:   9 month(s)  The format for your next appointment:   In Person  Provider:   You may see Sinclair Grooms, MD or one of the following Advanced Practice Providers on your designated Care Team:   Cecilie Kicks, NP    Other Instructions

## 2021-01-03 DIAGNOSIS — F3132 Bipolar disorder, current episode depressed, moderate: Secondary | ICD-10-CM | POA: Diagnosis not present

## 2021-01-15 DIAGNOSIS — Z5181 Encounter for therapeutic drug level monitoring: Secondary | ICD-10-CM | POA: Diagnosis not present

## 2021-01-15 DIAGNOSIS — N760 Acute vaginitis: Secondary | ICD-10-CM | POA: Diagnosis not present

## 2021-01-18 DIAGNOSIS — I4891 Unspecified atrial fibrillation: Secondary | ICD-10-CM | POA: Diagnosis not present

## 2021-01-18 DIAGNOSIS — Z5181 Encounter for therapeutic drug level monitoring: Secondary | ICD-10-CM | POA: Diagnosis not present

## 2021-01-18 DIAGNOSIS — Z7901 Long term (current) use of anticoagulants: Secondary | ICD-10-CM | POA: Diagnosis not present

## 2021-01-18 DIAGNOSIS — Z952 Presence of prosthetic heart valve: Secondary | ICD-10-CM | POA: Diagnosis not present

## 2021-01-22 DIAGNOSIS — F3132 Bipolar disorder, current episode depressed, moderate: Secondary | ICD-10-CM | POA: Diagnosis not present

## 2021-01-23 DIAGNOSIS — N898 Other specified noninflammatory disorders of vagina: Secondary | ICD-10-CM | POA: Diagnosis not present

## 2021-01-23 DIAGNOSIS — L304 Erythema intertrigo: Secondary | ICD-10-CM | POA: Diagnosis not present

## 2021-01-24 DIAGNOSIS — M6281 Muscle weakness (generalized): Secondary | ICD-10-CM | POA: Diagnosis not present

## 2021-01-24 DIAGNOSIS — S066X9D Traumatic subarachnoid hemorrhage with loss of consciousness of unspecified duration, subsequent encounter: Secondary | ICD-10-CM | POA: Diagnosis not present

## 2021-01-24 DIAGNOSIS — K219 Gastro-esophageal reflux disease without esophagitis: Secondary | ICD-10-CM | POA: Diagnosis not present

## 2021-01-24 DIAGNOSIS — R2681 Unsteadiness on feet: Secondary | ICD-10-CM | POA: Diagnosis not present

## 2021-01-24 DIAGNOSIS — R1312 Dysphagia, oropharyngeal phase: Secondary | ICD-10-CM | POA: Diagnosis not present

## 2021-01-24 DIAGNOSIS — R41841 Cognitive communication deficit: Secondary | ICD-10-CM | POA: Diagnosis not present

## 2021-01-24 DIAGNOSIS — R2689 Other abnormalities of gait and mobility: Secondary | ICD-10-CM | POA: Diagnosis not present

## 2021-01-24 DIAGNOSIS — R278 Other lack of coordination: Secondary | ICD-10-CM | POA: Diagnosis not present

## 2021-01-24 DIAGNOSIS — Z9181 History of falling: Secondary | ICD-10-CM | POA: Diagnosis not present

## 2021-01-24 DIAGNOSIS — R262 Difficulty in walking, not elsewhere classified: Secondary | ICD-10-CM | POA: Diagnosis not present

## 2021-01-25 DIAGNOSIS — E785 Hyperlipidemia, unspecified: Secondary | ICD-10-CM | POA: Diagnosis not present

## 2021-01-29 DIAGNOSIS — R791 Abnormal coagulation profile: Secondary | ICD-10-CM | POA: Diagnosis not present

## 2021-01-29 DIAGNOSIS — Z5181 Encounter for therapeutic drug level monitoring: Secondary | ICD-10-CM | POA: Diagnosis not present

## 2021-02-05 DIAGNOSIS — R0989 Other specified symptoms and signs involving the circulatory and respiratory systems: Secondary | ICD-10-CM | POA: Diagnosis not present

## 2021-02-05 DIAGNOSIS — I480 Paroxysmal atrial fibrillation: Secondary | ICD-10-CM | POA: Diagnosis not present

## 2021-02-05 DIAGNOSIS — Z5181 Encounter for therapeutic drug level monitoring: Secondary | ICD-10-CM | POA: Diagnosis not present

## 2021-02-06 ENCOUNTER — Telehealth: Payer: Self-pay | Admitting: Family Medicine

## 2021-02-06 NOTE — Telephone Encounter (Signed)
Left message for patient to call back and schedule Medicare Annual Wellness Visit (AWV) in office.   If not able to come in office, please offer to do virtually or by telephone.  Left office number and my jabber [phone].  Last AWV:06/05/2016  Please schedule at anytime with Nurse Health Advisor.

## 2021-02-12 ENCOUNTER — Ambulatory Visit (INDEPENDENT_AMBULATORY_CARE_PROVIDER_SITE_OTHER): Payer: HMO

## 2021-02-12 DIAGNOSIS — I495 Sick sinus syndrome: Secondary | ICD-10-CM

## 2021-02-12 DIAGNOSIS — Z5181 Encounter for therapeutic drug level monitoring: Secondary | ICD-10-CM | POA: Diagnosis not present

## 2021-02-12 DIAGNOSIS — Z952 Presence of prosthetic heart valve: Secondary | ICD-10-CM | POA: Diagnosis not present

## 2021-02-13 LAB — CUP PACEART REMOTE DEVICE CHECK
Battery Remaining Longevity: 174 mo
Battery Remaining Percentage: 100 %
Brady Statistic RA Percent Paced: 26 %
Brady Statistic RV Percent Paced: 4 %
Date Time Interrogation Session: 20220921023100
Implantable Lead Implant Date: 20210816
Implantable Lead Implant Date: 20210816
Implantable Lead Location: 753859
Implantable Lead Location: 753860
Implantable Lead Model: 7842
Implantable Lead Model: 7842
Implantable Lead Serial Number: 1013287
Implantable Lead Serial Number: 1091261
Implantable Pulse Generator Implant Date: 20210816
Lead Channel Impedance Value: 454 Ohm
Lead Channel Impedance Value: 646 Ohm
Lead Channel Setting Pacing Amplitude: 2.5 V
Lead Channel Setting Pacing Amplitude: 2.5 V
Lead Channel Setting Pacing Pulse Width: 0.4 ms
Lead Channel Setting Sensing Sensitivity: 2.5 mV
Pulse Gen Serial Number: 940432

## 2021-02-14 DIAGNOSIS — I10 Essential (primary) hypertension: Secondary | ICD-10-CM | POA: Diagnosis not present

## 2021-02-14 DIAGNOSIS — N189 Chronic kidney disease, unspecified: Secondary | ICD-10-CM | POA: Diagnosis not present

## 2021-02-14 DIAGNOSIS — M1990 Unspecified osteoarthritis, unspecified site: Secondary | ICD-10-CM | POA: Diagnosis not present

## 2021-02-14 DIAGNOSIS — R609 Edema, unspecified: Secondary | ICD-10-CM | POA: Diagnosis not present

## 2021-02-14 DIAGNOSIS — E119 Type 2 diabetes mellitus without complications: Secondary | ICD-10-CM | POA: Diagnosis not present

## 2021-02-14 DIAGNOSIS — E039 Hypothyroidism, unspecified: Secondary | ICD-10-CM | POA: Diagnosis not present

## 2021-02-14 DIAGNOSIS — E785 Hyperlipidemia, unspecified: Secondary | ICD-10-CM | POA: Diagnosis not present

## 2021-02-15 DIAGNOSIS — I501 Left ventricular failure: Secondary | ICD-10-CM | POA: Diagnosis not present

## 2021-02-15 DIAGNOSIS — I10 Essential (primary) hypertension: Secondary | ICD-10-CM | POA: Diagnosis not present

## 2021-02-15 DIAGNOSIS — R946 Abnormal results of thyroid function studies: Secondary | ICD-10-CM | POA: Diagnosis not present

## 2021-02-18 DIAGNOSIS — F5101 Primary insomnia: Secondary | ICD-10-CM | POA: Diagnosis not present

## 2021-02-18 DIAGNOSIS — R600 Localized edema: Secondary | ICD-10-CM | POA: Diagnosis not present

## 2021-02-18 DIAGNOSIS — R7989 Other specified abnormal findings of blood chemistry: Secondary | ICD-10-CM | POA: Diagnosis not present

## 2021-02-18 DIAGNOSIS — N1831 Chronic kidney disease, stage 3a: Secondary | ICD-10-CM | POA: Diagnosis not present

## 2021-02-18 DIAGNOSIS — Z7901 Long term (current) use of anticoagulants: Secondary | ICD-10-CM | POA: Diagnosis not present

## 2021-02-18 DIAGNOSIS — E039 Hypothyroidism, unspecified: Secondary | ICD-10-CM | POA: Diagnosis not present

## 2021-02-18 DIAGNOSIS — F3132 Bipolar disorder, current episode depressed, moderate: Secondary | ICD-10-CM | POA: Diagnosis not present

## 2021-02-19 NOTE — Progress Notes (Signed)
Remote pacemaker transmission.   

## 2021-02-25 DIAGNOSIS — Z7901 Long term (current) use of anticoagulants: Secondary | ICD-10-CM | POA: Diagnosis not present

## 2021-02-25 DIAGNOSIS — Z952 Presence of prosthetic heart valve: Secondary | ICD-10-CM | POA: Diagnosis not present

## 2021-02-25 DIAGNOSIS — R600 Localized edema: Secondary | ICD-10-CM | POA: Diagnosis not present

## 2021-02-25 DIAGNOSIS — R262 Difficulty in walking, not elsewhere classified: Secondary | ICD-10-CM | POA: Diagnosis not present

## 2021-03-12 DIAGNOSIS — I4892 Unspecified atrial flutter: Secondary | ICD-10-CM | POA: Diagnosis not present

## 2021-03-12 DIAGNOSIS — Z952 Presence of prosthetic heart valve: Secondary | ICD-10-CM | POA: Diagnosis not present

## 2021-03-12 DIAGNOSIS — Z5181 Encounter for therapeutic drug level monitoring: Secondary | ICD-10-CM | POA: Diagnosis not present

## 2021-03-12 DIAGNOSIS — R791 Abnormal coagulation profile: Secondary | ICD-10-CM | POA: Diagnosis not present

## 2021-03-29 DIAGNOSIS — Z7901 Long term (current) use of anticoagulants: Secondary | ICD-10-CM | POA: Diagnosis not present

## 2021-03-29 DIAGNOSIS — Z5181 Encounter for therapeutic drug level monitoring: Secondary | ICD-10-CM | POA: Diagnosis not present

## 2021-04-01 DIAGNOSIS — Z5181 Encounter for therapeutic drug level monitoring: Secondary | ICD-10-CM | POA: Diagnosis not present

## 2021-04-01 DIAGNOSIS — Z7901 Long term (current) use of anticoagulants: Secondary | ICD-10-CM | POA: Diagnosis not present

## 2021-04-08 DIAGNOSIS — F5101 Primary insomnia: Secondary | ICD-10-CM | POA: Diagnosis not present

## 2021-04-08 DIAGNOSIS — F319 Bipolar disorder, unspecified: Secondary | ICD-10-CM | POA: Diagnosis not present

## 2021-04-08 DIAGNOSIS — R059 Cough, unspecified: Secondary | ICD-10-CM | POA: Diagnosis not present

## 2021-04-08 DIAGNOSIS — F3132 Bipolar disorder, current episode depressed, moderate: Secondary | ICD-10-CM | POA: Diagnosis not present

## 2021-04-08 DIAGNOSIS — Z7901 Long term (current) use of anticoagulants: Secondary | ICD-10-CM | POA: Diagnosis not present

## 2021-04-09 DIAGNOSIS — R059 Cough, unspecified: Secondary | ICD-10-CM | POA: Diagnosis not present

## 2021-04-09 DIAGNOSIS — Z7901 Long term (current) use of anticoagulants: Secondary | ICD-10-CM | POA: Diagnosis not present

## 2021-04-11 DIAGNOSIS — E1159 Type 2 diabetes mellitus with other circulatory complications: Secondary | ICD-10-CM | POA: Diagnosis not present

## 2021-04-11 DIAGNOSIS — I4891 Unspecified atrial fibrillation: Secondary | ICD-10-CM | POA: Diagnosis not present

## 2021-04-11 DIAGNOSIS — Z7901 Long term (current) use of anticoagulants: Secondary | ICD-10-CM | POA: Diagnosis not present

## 2021-04-11 DIAGNOSIS — J40 Bronchitis, not specified as acute or chronic: Secondary | ICD-10-CM | POA: Diagnosis not present

## 2021-04-12 DIAGNOSIS — R059 Cough, unspecified: Secondary | ICD-10-CM | POA: Diagnosis not present

## 2021-04-12 DIAGNOSIS — E119 Type 2 diabetes mellitus without complications: Secondary | ICD-10-CM | POA: Diagnosis not present

## 2021-04-12 DIAGNOSIS — E785 Hyperlipidemia, unspecified: Secondary | ICD-10-CM | POA: Diagnosis not present

## 2021-04-12 DIAGNOSIS — I10 Essential (primary) hypertension: Secondary | ICD-10-CM | POA: Diagnosis not present

## 2021-04-16 DIAGNOSIS — R2681 Unsteadiness on feet: Secondary | ICD-10-CM | POA: Diagnosis not present

## 2021-04-16 DIAGNOSIS — Z9181 History of falling: Secondary | ICD-10-CM | POA: Diagnosis not present

## 2021-04-16 DIAGNOSIS — S066X9D Traumatic subarachnoid hemorrhage with loss of consciousness of unspecified duration, subsequent encounter: Secondary | ICD-10-CM | POA: Diagnosis not present

## 2021-04-16 DIAGNOSIS — K219 Gastro-esophageal reflux disease without esophagitis: Secondary | ICD-10-CM | POA: Diagnosis not present

## 2021-04-16 DIAGNOSIS — R1312 Dysphagia, oropharyngeal phase: Secondary | ICD-10-CM | POA: Diagnosis not present

## 2021-04-16 DIAGNOSIS — R262 Difficulty in walking, not elsewhere classified: Secondary | ICD-10-CM | POA: Diagnosis not present

## 2021-04-16 DIAGNOSIS — R278 Other lack of coordination: Secondary | ICD-10-CM | POA: Diagnosis not present

## 2021-04-16 DIAGNOSIS — I5043 Acute on chronic combined systolic (congestive) and diastolic (congestive) heart failure: Secondary | ICD-10-CM | POA: Diagnosis not present

## 2021-04-16 DIAGNOSIS — R2689 Other abnormalities of gait and mobility: Secondary | ICD-10-CM | POA: Diagnosis not present

## 2021-04-16 DIAGNOSIS — R41841 Cognitive communication deficit: Secondary | ICD-10-CM | POA: Diagnosis not present

## 2021-04-16 DIAGNOSIS — M6281 Muscle weakness (generalized): Secondary | ICD-10-CM | POA: Diagnosis not present

## 2021-04-17 DIAGNOSIS — K219 Gastro-esophageal reflux disease without esophagitis: Secondary | ICD-10-CM | POA: Diagnosis not present

## 2021-04-17 DIAGNOSIS — R278 Other lack of coordination: Secondary | ICD-10-CM | POA: Diagnosis not present

## 2021-04-17 DIAGNOSIS — R41841 Cognitive communication deficit: Secondary | ICD-10-CM | POA: Diagnosis not present

## 2021-04-17 DIAGNOSIS — I5043 Acute on chronic combined systolic (congestive) and diastolic (congestive) heart failure: Secondary | ICD-10-CM | POA: Diagnosis not present

## 2021-04-17 DIAGNOSIS — R262 Difficulty in walking, not elsewhere classified: Secondary | ICD-10-CM | POA: Diagnosis not present

## 2021-04-17 DIAGNOSIS — R2689 Other abnormalities of gait and mobility: Secondary | ICD-10-CM | POA: Diagnosis not present

## 2021-04-17 DIAGNOSIS — Z9181 History of falling: Secondary | ICD-10-CM | POA: Diagnosis not present

## 2021-04-17 DIAGNOSIS — M6281 Muscle weakness (generalized): Secondary | ICD-10-CM | POA: Diagnosis not present

## 2021-04-17 DIAGNOSIS — R1312 Dysphagia, oropharyngeal phase: Secondary | ICD-10-CM | POA: Diagnosis not present

## 2021-04-17 DIAGNOSIS — S066X9D Traumatic subarachnoid hemorrhage with loss of consciousness of unspecified duration, subsequent encounter: Secondary | ICD-10-CM | POA: Diagnosis not present

## 2021-04-17 DIAGNOSIS — R2681 Unsteadiness on feet: Secondary | ICD-10-CM | POA: Diagnosis not present

## 2021-04-19 DIAGNOSIS — Z0189 Encounter for other specified special examinations: Secondary | ICD-10-CM | POA: Diagnosis not present

## 2021-04-19 DIAGNOSIS — D649 Anemia, unspecified: Secondary | ICD-10-CM | POA: Diagnosis not present

## 2021-04-19 DIAGNOSIS — I4891 Unspecified atrial fibrillation: Secondary | ICD-10-CM | POA: Diagnosis not present

## 2021-04-19 DIAGNOSIS — Z7901 Long term (current) use of anticoagulants: Secondary | ICD-10-CM | POA: Diagnosis not present

## 2021-04-19 DIAGNOSIS — Z8679 Personal history of other diseases of the circulatory system: Secondary | ICD-10-CM | POA: Diagnosis not present

## 2021-04-19 DIAGNOSIS — I951 Orthostatic hypotension: Secondary | ICD-10-CM | POA: Diagnosis not present

## 2021-04-19 DIAGNOSIS — E039 Hypothyroidism, unspecified: Secondary | ICD-10-CM | POA: Diagnosis not present

## 2021-04-19 DIAGNOSIS — I4892 Unspecified atrial flutter: Secondary | ICD-10-CM | POA: Diagnosis not present

## 2021-04-19 DIAGNOSIS — F319 Bipolar disorder, unspecified: Secondary | ICD-10-CM | POA: Diagnosis not present

## 2021-04-19 DIAGNOSIS — R262 Difficulty in walking, not elsewhere classified: Secondary | ICD-10-CM | POA: Diagnosis not present

## 2021-04-19 DIAGNOSIS — Z79899 Other long term (current) drug therapy: Secondary | ICD-10-CM | POA: Diagnosis not present

## 2021-04-19 DIAGNOSIS — E119 Type 2 diabetes mellitus without complications: Secondary | ICD-10-CM | POA: Diagnosis not present

## 2021-04-22 DIAGNOSIS — J302 Other seasonal allergic rhinitis: Secondary | ICD-10-CM | POA: Diagnosis not present

## 2021-04-22 DIAGNOSIS — Z9181 History of falling: Secondary | ICD-10-CM | POA: Diagnosis not present

## 2021-04-22 DIAGNOSIS — R278 Other lack of coordination: Secondary | ICD-10-CM | POA: Diagnosis not present

## 2021-04-22 DIAGNOSIS — R2689 Other abnormalities of gait and mobility: Secondary | ICD-10-CM | POA: Diagnosis not present

## 2021-04-22 DIAGNOSIS — F319 Bipolar disorder, unspecified: Secondary | ICD-10-CM | POA: Diagnosis not present

## 2021-04-22 DIAGNOSIS — F5101 Primary insomnia: Secondary | ICD-10-CM | POA: Diagnosis not present

## 2021-04-22 DIAGNOSIS — I5043 Acute on chronic combined systolic (congestive) and diastolic (congestive) heart failure: Secondary | ICD-10-CM | POA: Diagnosis not present

## 2021-04-22 DIAGNOSIS — Z8679 Personal history of other diseases of the circulatory system: Secondary | ICD-10-CM | POA: Diagnosis not present

## 2021-04-22 DIAGNOSIS — R2681 Unsteadiness on feet: Secondary | ICD-10-CM | POA: Diagnosis not present

## 2021-04-22 DIAGNOSIS — R262 Difficulty in walking, not elsewhere classified: Secondary | ICD-10-CM | POA: Diagnosis not present

## 2021-04-22 DIAGNOSIS — F4321 Adjustment disorder with depressed mood: Secondary | ICD-10-CM | POA: Diagnosis not present

## 2021-04-22 DIAGNOSIS — M6281 Muscle weakness (generalized): Secondary | ICD-10-CM | POA: Diagnosis not present

## 2021-04-22 DIAGNOSIS — S066X9D Traumatic subarachnoid hemorrhage with loss of consciousness of unspecified duration, subsequent encounter: Secondary | ICD-10-CM | POA: Diagnosis not present

## 2021-04-22 DIAGNOSIS — R41841 Cognitive communication deficit: Secondary | ICD-10-CM | POA: Diagnosis not present

## 2021-04-22 DIAGNOSIS — J40 Bronchitis, not specified as acute or chronic: Secondary | ICD-10-CM | POA: Diagnosis not present

## 2021-04-22 DIAGNOSIS — K219 Gastro-esophageal reflux disease without esophagitis: Secondary | ICD-10-CM | POA: Diagnosis not present

## 2021-04-22 DIAGNOSIS — F3132 Bipolar disorder, current episode depressed, moderate: Secondary | ICD-10-CM | POA: Diagnosis not present

## 2021-04-22 DIAGNOSIS — R1312 Dysphagia, oropharyngeal phase: Secondary | ICD-10-CM | POA: Diagnosis not present

## 2021-04-23 DIAGNOSIS — R2689 Other abnormalities of gait and mobility: Secondary | ICD-10-CM | POA: Diagnosis not present

## 2021-04-23 DIAGNOSIS — K219 Gastro-esophageal reflux disease without esophagitis: Secondary | ICD-10-CM | POA: Diagnosis not present

## 2021-04-23 DIAGNOSIS — I5043 Acute on chronic combined systolic (congestive) and diastolic (congestive) heart failure: Secondary | ICD-10-CM | POA: Diagnosis not present

## 2021-04-23 DIAGNOSIS — R278 Other lack of coordination: Secondary | ICD-10-CM | POA: Diagnosis not present

## 2021-04-23 DIAGNOSIS — R1312 Dysphagia, oropharyngeal phase: Secondary | ICD-10-CM | POA: Diagnosis not present

## 2021-04-23 DIAGNOSIS — M6281 Muscle weakness (generalized): Secondary | ICD-10-CM | POA: Diagnosis not present

## 2021-04-23 DIAGNOSIS — R2681 Unsteadiness on feet: Secondary | ICD-10-CM | POA: Diagnosis not present

## 2021-04-23 DIAGNOSIS — S066X9D Traumatic subarachnoid hemorrhage with loss of consciousness of unspecified duration, subsequent encounter: Secondary | ICD-10-CM | POA: Diagnosis not present

## 2021-04-23 DIAGNOSIS — R262 Difficulty in walking, not elsewhere classified: Secondary | ICD-10-CM | POA: Diagnosis not present

## 2021-04-23 DIAGNOSIS — R41841 Cognitive communication deficit: Secondary | ICD-10-CM | POA: Diagnosis not present

## 2021-04-23 DIAGNOSIS — Z9181 History of falling: Secondary | ICD-10-CM | POA: Diagnosis not present

## 2021-04-24 DIAGNOSIS — F3132 Bipolar disorder, current episode depressed, moderate: Secondary | ICD-10-CM | POA: Diagnosis not present

## 2021-04-24 DIAGNOSIS — F4321 Adjustment disorder with depressed mood: Secondary | ICD-10-CM | POA: Diagnosis not present

## 2021-04-25 DIAGNOSIS — R41841 Cognitive communication deficit: Secondary | ICD-10-CM | POA: Diagnosis not present

## 2021-04-25 DIAGNOSIS — U071 COVID-19: Secondary | ICD-10-CM | POA: Diagnosis not present

## 2021-04-25 DIAGNOSIS — R1312 Dysphagia, oropharyngeal phase: Secondary | ICD-10-CM | POA: Diagnosis not present

## 2021-04-25 DIAGNOSIS — R2681 Unsteadiness on feet: Secondary | ICD-10-CM | POA: Diagnosis not present

## 2021-04-25 DIAGNOSIS — M6281 Muscle weakness (generalized): Secondary | ICD-10-CM | POA: Diagnosis not present

## 2021-04-25 DIAGNOSIS — S066X9D Traumatic subarachnoid hemorrhage with loss of consciousness of unspecified duration, subsequent encounter: Secondary | ICD-10-CM | POA: Diagnosis not present

## 2021-04-25 DIAGNOSIS — R278 Other lack of coordination: Secondary | ICD-10-CM | POA: Diagnosis not present

## 2021-04-25 DIAGNOSIS — R2689 Other abnormalities of gait and mobility: Secondary | ICD-10-CM | POA: Diagnosis not present

## 2021-04-25 DIAGNOSIS — Z9181 History of falling: Secondary | ICD-10-CM | POA: Diagnosis not present

## 2021-04-25 DIAGNOSIS — I5043 Acute on chronic combined systolic (congestive) and diastolic (congestive) heart failure: Secondary | ICD-10-CM | POA: Diagnosis not present

## 2021-04-25 DIAGNOSIS — K219 Gastro-esophageal reflux disease without esophagitis: Secondary | ICD-10-CM | POA: Diagnosis not present

## 2021-04-25 DIAGNOSIS — R262 Difficulty in walking, not elsewhere classified: Secondary | ICD-10-CM | POA: Diagnosis not present

## 2021-05-02 DIAGNOSIS — J069 Acute upper respiratory infection, unspecified: Secondary | ICD-10-CM | POA: Diagnosis not present

## 2021-05-02 DIAGNOSIS — R030 Elevated blood-pressure reading, without diagnosis of hypertension: Secondary | ICD-10-CM | POA: Diagnosis not present

## 2021-05-07 DIAGNOSIS — I4891 Unspecified atrial fibrillation: Secondary | ICD-10-CM | POA: Diagnosis not present

## 2021-05-14 ENCOUNTER — Ambulatory Visit (INDEPENDENT_AMBULATORY_CARE_PROVIDER_SITE_OTHER): Payer: HMO

## 2021-05-14 DIAGNOSIS — I495 Sick sinus syndrome: Secondary | ICD-10-CM

## 2021-05-15 DIAGNOSIS — R791 Abnormal coagulation profile: Secondary | ICD-10-CM | POA: Diagnosis not present

## 2021-05-15 DIAGNOSIS — Z7901 Long term (current) use of anticoagulants: Secondary | ICD-10-CM | POA: Diagnosis not present

## 2021-05-15 DIAGNOSIS — U071 COVID-19: Secondary | ICD-10-CM | POA: Diagnosis not present

## 2021-05-15 LAB — CUP PACEART REMOTE DEVICE CHECK
Battery Remaining Longevity: 168 mo
Battery Remaining Percentage: 100 %
Brady Statistic RA Percent Paced: 17 %
Brady Statistic RV Percent Paced: 2 %
Date Time Interrogation Session: 20221221023100
Implantable Lead Implant Date: 20210816
Implantable Lead Implant Date: 20210816
Implantable Lead Location: 753859
Implantable Lead Location: 753860
Implantable Lead Model: 7842
Implantable Lead Model: 7842
Implantable Lead Serial Number: 1013287
Implantable Lead Serial Number: 1091261
Implantable Pulse Generator Implant Date: 20210816
Lead Channel Impedance Value: 438 Ohm
Lead Channel Impedance Value: 685 Ohm
Lead Channel Setting Pacing Amplitude: 2.5 V
Lead Channel Setting Pacing Amplitude: 2.5 V
Lead Channel Setting Pacing Pulse Width: 0.4 ms
Lead Channel Setting Sensing Sensitivity: 2.5 mV
Pulse Gen Serial Number: 940432

## 2021-05-17 DIAGNOSIS — Z5181 Encounter for therapeutic drug level monitoring: Secondary | ICD-10-CM | POA: Diagnosis not present

## 2021-05-17 DIAGNOSIS — Z952 Presence of prosthetic heart valve: Secondary | ICD-10-CM | POA: Diagnosis not present

## 2021-05-21 DIAGNOSIS — U071 COVID-19: Secondary | ICD-10-CM | POA: Diagnosis not present

## 2021-05-21 DIAGNOSIS — I4891 Unspecified atrial fibrillation: Secondary | ICD-10-CM | POA: Diagnosis not present

## 2021-05-21 DIAGNOSIS — Z7901 Long term (current) use of anticoagulants: Secondary | ICD-10-CM | POA: Diagnosis not present

## 2021-05-23 NOTE — Progress Notes (Signed)
Remote pacemaker transmission.   

## 2021-05-27 DIAGNOSIS — M6281 Muscle weakness (generalized): Secondary | ICD-10-CM | POA: Diagnosis not present

## 2021-05-27 DIAGNOSIS — R262 Difficulty in walking, not elsewhere classified: Secondary | ICD-10-CM | POA: Diagnosis not present

## 2021-05-27 DIAGNOSIS — R41841 Cognitive communication deficit: Secondary | ICD-10-CM | POA: Diagnosis not present

## 2021-05-27 DIAGNOSIS — Z9181 History of falling: Secondary | ICD-10-CM | POA: Diagnosis not present

## 2021-05-27 DIAGNOSIS — R2681 Unsteadiness on feet: Secondary | ICD-10-CM | POA: Diagnosis not present

## 2021-05-27 DIAGNOSIS — R2689 Other abnormalities of gait and mobility: Secondary | ICD-10-CM | POA: Diagnosis not present

## 2021-05-27 DIAGNOSIS — S066X9D Traumatic subarachnoid hemorrhage with loss of consciousness of unspecified duration, subsequent encounter: Secondary | ICD-10-CM | POA: Diagnosis not present

## 2021-05-27 DIAGNOSIS — K219 Gastro-esophageal reflux disease without esophagitis: Secondary | ICD-10-CM | POA: Diagnosis not present

## 2021-05-27 DIAGNOSIS — I5043 Acute on chronic combined systolic (congestive) and diastolic (congestive) heart failure: Secondary | ICD-10-CM | POA: Diagnosis not present

## 2021-05-27 DIAGNOSIS — U071 COVID-19: Secondary | ICD-10-CM | POA: Diagnosis not present

## 2021-05-27 DIAGNOSIS — R1312 Dysphagia, oropharyngeal phase: Secondary | ICD-10-CM | POA: Diagnosis not present

## 2021-05-27 DIAGNOSIS — R278 Other lack of coordination: Secondary | ICD-10-CM | POA: Diagnosis not present

## 2021-05-28 DIAGNOSIS — R410 Disorientation, unspecified: Secondary | ICD-10-CM | POA: Diagnosis not present

## 2021-05-28 DIAGNOSIS — N1831 Chronic kidney disease, stage 3a: Secondary | ICD-10-CM | POA: Diagnosis not present

## 2021-05-28 DIAGNOSIS — R059 Cough, unspecified: Secondary | ICD-10-CM | POA: Diagnosis not present

## 2021-05-28 DIAGNOSIS — U071 COVID-19: Secondary | ICD-10-CM | POA: Diagnosis not present

## 2021-05-28 DIAGNOSIS — N390 Urinary tract infection, site not specified: Secondary | ICD-10-CM | POA: Diagnosis not present

## 2021-05-28 DIAGNOSIS — R296 Repeated falls: Secondary | ICD-10-CM | POA: Diagnosis not present

## 2021-05-28 DIAGNOSIS — Z87828 Personal history of other (healed) physical injury and trauma: Secondary | ICD-10-CM | POA: Diagnosis not present

## 2021-05-30 DIAGNOSIS — R42 Dizziness and giddiness: Secondary | ICD-10-CM | POA: Diagnosis not present

## 2021-05-30 DIAGNOSIS — Z7901 Long term (current) use of anticoagulants: Secondary | ICD-10-CM | POA: Diagnosis not present

## 2021-05-30 DIAGNOSIS — N390 Urinary tract infection, site not specified: Secondary | ICD-10-CM | POA: Diagnosis not present

## 2021-06-04 DIAGNOSIS — U099 Post covid-19 condition, unspecified: Secondary | ICD-10-CM | POA: Diagnosis not present

## 2021-06-04 DIAGNOSIS — N390 Urinary tract infection, site not specified: Secondary | ICD-10-CM | POA: Diagnosis not present

## 2021-06-04 DIAGNOSIS — F319 Bipolar disorder, unspecified: Secondary | ICD-10-CM | POA: Diagnosis not present

## 2021-06-04 DIAGNOSIS — E039 Hypothyroidism, unspecified: Secondary | ICD-10-CM | POA: Diagnosis not present

## 2021-06-06 DIAGNOSIS — F3132 Bipolar disorder, current episode depressed, moderate: Secondary | ICD-10-CM | POA: Diagnosis not present

## 2021-06-08 DIAGNOSIS — I10 Essential (primary) hypertension: Secondary | ICD-10-CM | POA: Diagnosis not present

## 2021-06-10 DIAGNOSIS — F5101 Primary insomnia: Secondary | ICD-10-CM | POA: Diagnosis not present

## 2021-06-10 DIAGNOSIS — M2021 Hallux rigidus, right foot: Secondary | ICD-10-CM | POA: Diagnosis not present

## 2021-06-10 DIAGNOSIS — I10 Essential (primary) hypertension: Secondary | ICD-10-CM | POA: Diagnosis not present

## 2021-06-10 DIAGNOSIS — M2022 Hallux rigidus, left foot: Secondary | ICD-10-CM | POA: Diagnosis not present

## 2021-06-10 DIAGNOSIS — E1151 Type 2 diabetes mellitus with diabetic peripheral angiopathy without gangrene: Secondary | ICD-10-CM | POA: Diagnosis not present

## 2021-06-10 DIAGNOSIS — Z7984 Long term (current) use of oral hypoglycemic drugs: Secondary | ICD-10-CM | POA: Diagnosis not present

## 2021-06-10 DIAGNOSIS — L603 Nail dystrophy: Secondary | ICD-10-CM | POA: Diagnosis not present

## 2021-06-10 DIAGNOSIS — F3132 Bipolar disorder, current episode depressed, moderate: Secondary | ICD-10-CM | POA: Diagnosis not present

## 2021-06-15 DIAGNOSIS — I10 Essential (primary) hypertension: Secondary | ICD-10-CM | POA: Diagnosis not present

## 2021-06-15 DIAGNOSIS — N390 Urinary tract infection, site not specified: Secondary | ICD-10-CM | POA: Diagnosis not present

## 2021-06-17 DIAGNOSIS — N390 Urinary tract infection, site not specified: Secondary | ICD-10-CM | POA: Diagnosis not present

## 2021-06-17 DIAGNOSIS — U099 Post covid-19 condition, unspecified: Secondary | ICD-10-CM | POA: Diagnosis not present

## 2021-06-17 DIAGNOSIS — I10 Essential (primary) hypertension: Secondary | ICD-10-CM | POA: Diagnosis not present

## 2021-06-17 DIAGNOSIS — F319 Bipolar disorder, unspecified: Secondary | ICD-10-CM | POA: Diagnosis not present

## 2021-06-18 DIAGNOSIS — F5101 Primary insomnia: Secondary | ICD-10-CM | POA: Diagnosis not present

## 2021-06-18 DIAGNOSIS — F3132 Bipolar disorder, current episode depressed, moderate: Secondary | ICD-10-CM | POA: Diagnosis not present

## 2021-06-21 ENCOUNTER — Telehealth: Payer: Self-pay | Admitting: Interventional Cardiology

## 2021-06-21 DIAGNOSIS — I4891 Unspecified atrial fibrillation: Secondary | ICD-10-CM | POA: Diagnosis not present

## 2021-06-21 DIAGNOSIS — I509 Heart failure, unspecified: Secondary | ICD-10-CM | POA: Diagnosis not present

## 2021-06-21 DIAGNOSIS — N1831 Chronic kidney disease, stage 3a: Secondary | ICD-10-CM | POA: Diagnosis not present

## 2021-06-21 DIAGNOSIS — F319 Bipolar disorder, unspecified: Secondary | ICD-10-CM | POA: Diagnosis not present

## 2021-06-21 DIAGNOSIS — R0602 Shortness of breath: Secondary | ICD-10-CM | POA: Diagnosis not present

## 2021-06-21 NOTE — Progress Notes (Signed)
Cardiology Office Note:    Date:  06/21/2021   ID:  [name], DOB November 05, 1950, MRN [id number]  PCP:  Cynthia Lukes, MD  Cardiologist:  Cynthia Grooms, MD   Referring MD: Cynthia Lukes, MD   No chief complaint on file.    History of Present Illness:    [name] is at [facility]. Apparently she is supposed to be on lasix 40 mg daily and this has not been given   She has a hx of aortic valve replacement with a mechanical prosthesis 2011, history of recurrent atrial fibrillation, hyperlipidemia, diabetes mellitus type 2, and chronic anticoagulation therapy with Coumadin, and chronic diastolic HF.  She was having uncontrolled atrial fibrillation and atrial flutter, having bradycardia as well, underwent pacemaker implantation, amiodarone loading, and cardioversion successfully.   December 2021  she had multiple falls 2 of which required craniotomy for subdural hematoma.  She has now mostly wheelchair-bound.  She has terrible difficulty with ambulation and balance. Despite this she is still on coumadin due to AVR   TTE 03/28/20 showed EF 50-55% mild RV reduction MAC 25 mm St Jude mechanical AVR trivial AR and mean gradient 7 peak 16 mmhg  Sees [name] for her PPM/PAF.  She is on QT prolonging drugs for bipolar And amiodarone has been d/c   She really does not appear in distress. Not tachypnic. Has varicose veins and some dependant edema. Afib rates appear high Just got back on lasix 2 days ago     Past Medical History:  Diagnosis Date   ANEMIA 05/28/2010   AORTIC VALVE REPLACEMENT, HX OF 03/11/2010   Mechanical prosthesis   BIPOLAR AFFECTIVE DISORDER 03/11/2010   Depression    Diverticulitis 12/18/2010   FIBROIDS, UTERUS 03/11/2010   Gout 09/04/2016   Grave's disease 8-12   Hyperlipidemia associated with type 2 diabetes mellitus ([name]) 06/05/2016   Hypertension    Low back pain 11/02/2017   Migraine 06/05/2016   Mixed  hyperlipidemia 03/11/2010   Obesity    Skin cancer    Syncope 08/22/2019   UTI (lower urinary tract infection) 08/14/2011    Past Surgical History:  Procedure Laterality Date   ABDOMINAL HYSTERECTOMY  ?1998   sb/l spo, total for fibroids/heavy bleeding   ANKLE SURGERY Left 01/2018   hardware removal   AORTIC VALVE REPLACEMENT  2011   Mechanical prosthesis, St. Jude   APPENDECTOMY     Required revision for EColi infection, required recurrent packing   bowel obstruction     Requiring adhesions to be removed   CARDIOVERSION N/A 05/09/2020   Procedure: CARDIOVERSION;  Surgeon: Cynthia Latch, MD;  Location: [place];  Service: Cardiovascular;  Laterality: N/A;   CHOLECYSTECTOMY     COLONOSCOPY WITH PROPOFOL N/A 11/01/2015   Procedure: COLONOSCOPY WITH PROPOFOL;  Surgeon: Milus Banister, MD;  Location: [facility];  Service: Endoscopy;  Laterality: N/A;   CYSTOCELE REPAIR N/A 10/16/2015   Procedure: ANTERIOR REPAIR (CYSTOCELE);  Surgeon: Donnamae Jude, MD;  Location: [facility];  Service: Gynecology;  Laterality: N/A;   HERNIA REPAIR  08-16-10   LEFT HEART CATHETERIZATION WITH CORONARY ANGIOGRAM N/A 12/05/2011   Procedure: LEFT HEART CATHETERIZATION WITH CORONARY ANGIOGRAM;  Surgeon: Cynthia Grooms, MD;  Location: [facility];  Service: Cardiovascular;  Laterality: N/A;   PACEMAKER IMPLANT N/A 01/09/2020   Procedure: PACEMAKER IMPLANT;  Surgeon: Evans Lance, MD;  Location: [place]  CV [facility];  Service: Cardiovascular;  Laterality: N/A;   TOTAL KNEE ARTHROPLASTY Right 02/24/2013   Procedure: RIGHT TOTAL KNEE ARTHROPLASTY;  Surgeon: Johnn Hai, MD;  Location: [facility];  Service: Orthopedics;  Laterality: Right;   TUBAL LIGATION     varicose vein surgery b/l legs      Current Medications: No outpatient medications have been marked as taking for the 06/24/21 encounter (Appointment) with Cynthia Hector, MD.     Allergies:   Mucinex [guaifenesin er], Keflex [cephalexin], Keflex  [cephalexin], Mucinex [guaifenesin er], Sulfa antibiotics, Sulfa antibiotics, and Sulfonamide derivatives   Social History   Socioeconomic History   Marital status: Married    Spouse name: Not on file   Number of children: Not on file   Years of education: Not on file   Highest education level: Not on file  Occupational History   Not on file  Tobacco Use   Smoking status: Former    Types: Cigarettes    Quit date: 05/26/1990    Years since quitting: 31.0   Smokeless tobacco: Never  Vaping Use   Vaping Use: Never used  Substance and Sexual Activity   Alcohol use: Yes    Alcohol/week: 0.0 standard drinks    Comment: rare   Drug use: No   Sexual activity: Not on file  Other Topics Concern   Not on file  Social History Narrative   ** Merged History Encounter **       Social Determinants of Health   Financial Resource Strain: Not on file  Food Insecurity: Not on file  Transportation Needs: Not on file  Physical Activity: Not on file  Stress: Not on file  Social Connections: Not on file     Family History: The patient's family history includes Alcohol abuse in her brother, brother, father, and maternal grandfather; Aneurysm in her mother; Arthritis in her mother; Cancer in her maternal grandmother; Depression in her brother, brother, paternal grandfather, paternal grandmother, sister, and sister; Diabetes in her maternal grandfather, paternal grandfather, and paternal grandmother; Heart disease in her brother and maternal grandfather; Scoliosis in her mother.  ROS:   Please see the history of present illness.    Craniotomy secondary to falls and occurred in January 2022.  She lives at [facility] and is wheelchair-bound.  Her appetite is good.  She otherwise feels that she is doing well.  She has no cardiac complaints.  All other systems reviewed and are negative.  EKGs/Labs/Other Studies Reviewed:    The following studies were reviewed today: 2D Doppler  echocardiogram November 2021: IMPRESSIONS     1. Left ventricular ejection fraction, by estimation, is 50 to 55%. The  left ventricle has low normal function. Left ventricular endocardial  border not optimally defined to evaluate regional wall motion. Left  ventricular diastolic parameters are  indeterminate.   2. Right ventricular systolic function is mildly reduced. The right  ventricular size is normal. Tricuspid regurgitation signal is inadequate  for assessing PA pressure.   3. The mitral valve is degenerative. No evidence of mitral valve  regurgitation. No evidence of mitral stenosis. Moderate mitral annular  calcification.   4. The aortic valve has been repaired/replaced. Aortic valve  regurgitation is trivial. No aortic stenosis is present. Mechanical valve  leaflets not well visualized. There is a 25 mm St. Jude mechanical valved  conduit valve present in the aortic  position. Procedure Date: 05/22/2008. Echo findings are consistent with  normal structure and function of the  aortic valve prosthesis. Aortic valve  mean gradient measures 7.0 mmHg.   5. Aortic root/ascending aorta has been repaired/replaced. Grossly normal  appearance in proximal segment, not well visualized beyond proximal  ascending aorta.   6. The inferior vena cava is normal in size with greater than 50%  respiratory variability, suggesting right atrial pressure of 3 mmHg.   Conclusion(s)/Recommendation(s): No intracardiac source of embolism  detected on this transthoracic study. A transesophageal echocardiogram is  recommended to exclude cardiac source of embolism if clinically indicated.     EKG:  EKG not performed  Recent Labs: 06/26/2020: TSH 3.43 07/24/2020: ALT 32; Hemoglobin 9.5; Platelets 207 07/25/2020: BUN 20; Creatinine, Ser 1.33; Potassium 3.8; Sodium 137  Recent Lipid Panel    Component Value Date/Time   CHOL 147 07/24/2020 0358   TRIG 124 07/24/2020 0358   HDL 62 07/24/2020 0358    CHOLHDL 2.4 07/24/2020 0358   VLDL 25 07/24/2020 0358   LDLCALC 60 07/24/2020 0358   LDLDIRECT 131.8 (H) 08/23/2019 0312    Physical Exam:    VS:  There were no vitals taken for this visit.    Wt Readings from Last 3 Encounters:  01/02/21 220 lb 9.6 oz (100.1 kg)  10/16/20 209 lb (94.8 kg)  07/23/20 191 lb (86.6 kg)     [place] elderly female  Chair bound S2 click SEM no AR murmur  PPM under left clavicle Abdomen benign  [name] plus one with varicosities   ASSESSMENT:    No diagnosis found.  PLAN:    In order of problems listed above:   Dyspnea:  in setting of lasix not being given in SNF. Check BNP and BMET resume lasix 40 mg daily  PAF:  on coumadin and coreg Rate high increase coreg to 6.25 bid  AVR:  Old St Jude mechanical normal function on TTE 03/28/20 normal exam  PPM:  normal function f/u [name]     Medication Adjustments/Labs and Tests Ordered: Current medicines are reviewed at length with the patient today.  Concerns regarding medicines are outlined above.   Coreg 6.25 bid Lasix 40 mg daily BMET/BNP   F/U with Cynthia Tamala Julian   Signed, Jenkins Rouge, MD  06/21/2021 2:45 PM    [facility]

## 2021-06-21 NOTE — Telephone Encounter (Signed)
°  Pt c/o Shortness Of Breath: STAT if SOB developed within the last 24 hours or pt is noticeably SOB on the phone  1. Are you currently SOB (can you hear that pt is SOB on the phone)? No   2. How long have you been experiencing SOB? Last few days   3. Are you SOB when sitting or when up moving around? Sitting   4. Are you currently experiencing any other symptoms?   [name] calling, she said she spoke with her mom on the phone and pt's sob is getting worst. Pt is in skilled nursing home, she is concerned and requesting to speak with a nurse

## 2021-06-21 NOTE — Telephone Encounter (Signed)
Phone call to pt's daughter, DPR and advised per [name] at [facility], pt has not been receiving Furosemide 40mg  - 1 tablet by mouth daily but pt is currently being evaluated by staff provider.  Advised daughter if pt is restarted on diuretic and improves over the weekend to please contact [facility] and cancel appointment for 06/24/2021 with DOD, Dr Johnsie Cancel.  [name] verbalizes understanding and agrees with current plan.

## 2021-06-21 NOTE — Telephone Encounter (Signed)
Phone call to [facility] and spoke with Elberta Fortis, nursing staff for pt who states pt is currently being evaluated by staff provider.  Elberta Fortis states pt does not have order for Furosemide or any diuretic at this time.  He is not sure why.  Advised pt last prescribed Furosemide 40mg  - 1 tablet by mouth daily by Dr Tamala Julian.  Elberta Fortis states he will be getting new orders from staff provider re: pt's increasing SOB and diuretic and thanked RN for the call.

## 2021-06-21 NOTE — Telephone Encounter (Signed)
Spoke with pt's daughter, DPR who reports pt lives in [place].  [name] reports increasing SOB even at rest.  Pt having to sit up more as she is uncomfortable trying to breathe while lying in bed.  Daughter reports increasing bilateral LEE.  Due to pt residing in SNF it is difficult for daughter to know if pt is following low sodium diet and meds are being given as prescribed.  She is requesting pt be seen so pt does not advance to hospitalization.  Appointment scheduled with DOD, Dr Johnsie Cancel 06/24/2021 at 1030am..  [name] also asking if RN will contact SNF to confirm Furosemide dosing.  Daughter verbalizes understanding and agrees with current plan.

## 2021-06-24 ENCOUNTER — Encounter: Payer: Self-pay | Admitting: Cardiovascular Disease

## 2021-06-24 ENCOUNTER — Other Ambulatory Visit: Payer: Self-pay

## 2021-06-24 ENCOUNTER — Ambulatory Visit (INDEPENDENT_AMBULATORY_CARE_PROVIDER_SITE_OTHER): Payer: HMO | Admitting: Cardiovascular Disease

## 2021-06-24 VITALS — HR 103 | Ht 68.0 in | Wt 210.0 lb

## 2021-06-24 DIAGNOSIS — Z5181 Encounter for therapeutic drug level monitoring: Secondary | ICD-10-CM

## 2021-06-24 DIAGNOSIS — R609 Edema, unspecified: Secondary | ICD-10-CM

## 2021-06-24 DIAGNOSIS — Z950 Presence of cardiac pacemaker: Secondary | ICD-10-CM

## 2021-06-24 DIAGNOSIS — I482 Chronic atrial fibrillation, unspecified: Secondary | ICD-10-CM

## 2021-06-24 DIAGNOSIS — Z7901 Long term (current) use of anticoagulants: Secondary | ICD-10-CM | POA: Diagnosis not present

## 2021-06-24 DIAGNOSIS — Z952 Presence of prosthetic heart valve: Secondary | ICD-10-CM

## 2021-06-24 DIAGNOSIS — R0602 Shortness of breath: Secondary | ICD-10-CM | POA: Diagnosis not present

## 2021-06-24 MED ORDER — FUROSEMIDE 40 MG PO TABS: 40.0000 mg | ORAL_TABLET | Freq: Every day | ORAL | 3 refills | Status: DC

## 2021-06-24 MED ORDER — CARVEDILOL 6.25 MG PO TABS: 6.2500 mg | ORAL_TABLET | Freq: Two times a day (BID) | ORAL | 3 refills | Status: DC

## 2021-06-24 NOTE — Patient Instructions (Addendum)
Medication Instructions:  Your physician has recommended you make the following change in your medication:  1-INCREASE Coreg 6.25 mg by mouth twice daily 2-START Lasix 40 mg by mouth daily.  *If you need a refill on your cardiac medications before your next appointment, please call your pharmacy*  Lab Work: Your physician recommends that you have lab work today- BMET and BNP  If you have labs (blood work) drawn today and your tests are completely normal, you will receive your results only by: MyChart Message (if you have [place]) OR A paper copy in the mail If you have any lab test that is abnormal or we need to change your treatment, we will call you to review the results.  Testing/Procedures: None ordered today.  Follow-Up: At [facility], you and your health needs are our priority.  As part of our continuing mission to provide you with exceptional heart care, we have created designated Provider Care Teams.  These Care Teams include your primary Cardiologist (physician) and Advanced Practice Providers (APPs -  Physician Assistants and Nurse Practitioners) who all work together to provide you with the care you need, when you need it.  We recommend signing up for the patient portal called "MyChart".  Sign up information is provided on this After Visit Summary.  MyChart is used to connect with patients for Virtual Visits (Telemedicine).  Patients are able to view lab/test results, encounter notes, upcoming appointments, etc.  Non-urgent messages can be sent to your provider as well.   To learn more about what you can do with MyChart, go to [email or web address].    Your next appointment:   4 to 6 weeks  The format for your next appointment:   In Person  Provider:   Sinclair Grooms, MD {

## 2021-06-26 DIAGNOSIS — E1159 Type 2 diabetes mellitus with other circulatory complications: Secondary | ICD-10-CM | POA: Diagnosis not present

## 2021-06-26 DIAGNOSIS — K219 Gastro-esophageal reflux disease without esophagitis: Secondary | ICD-10-CM | POA: Diagnosis not present

## 2021-06-26 DIAGNOSIS — U071 COVID-19: Secondary | ICD-10-CM | POA: Diagnosis not present

## 2021-06-26 DIAGNOSIS — E785 Hyperlipidemia, unspecified: Secondary | ICD-10-CM | POA: Diagnosis not present

## 2021-06-26 DIAGNOSIS — R278 Other lack of coordination: Secondary | ICD-10-CM | POA: Diagnosis not present

## 2021-06-26 DIAGNOSIS — R262 Difficulty in walking, not elsewhere classified: Secondary | ICD-10-CM | POA: Diagnosis not present

## 2021-06-26 DIAGNOSIS — S066X9D Traumatic subarachnoid hemorrhage with loss of consciousness of unspecified duration, subsequent encounter: Secondary | ICD-10-CM | POA: Diagnosis not present

## 2021-06-26 DIAGNOSIS — R41841 Cognitive communication deficit: Secondary | ICD-10-CM | POA: Diagnosis not present

## 2021-06-26 DIAGNOSIS — Z7901 Long term (current) use of anticoagulants: Secondary | ICD-10-CM | POA: Diagnosis not present

## 2021-06-26 DIAGNOSIS — R1312 Dysphagia, oropharyngeal phase: Secondary | ICD-10-CM | POA: Diagnosis not present

## 2021-06-26 DIAGNOSIS — R2689 Other abnormalities of gait and mobility: Secondary | ICD-10-CM | POA: Diagnosis not present

## 2021-06-26 DIAGNOSIS — I509 Heart failure, unspecified: Secondary | ICD-10-CM | POA: Diagnosis not present

## 2021-06-26 DIAGNOSIS — Z9181 History of falling: Secondary | ICD-10-CM | POA: Diagnosis not present

## 2021-06-26 DIAGNOSIS — M6281 Muscle weakness (generalized): Secondary | ICD-10-CM | POA: Diagnosis not present

## 2021-06-26 DIAGNOSIS — F319 Bipolar disorder, unspecified: Secondary | ICD-10-CM | POA: Diagnosis not present

## 2021-06-26 DIAGNOSIS — I4891 Unspecified atrial fibrillation: Secondary | ICD-10-CM | POA: Diagnosis not present

## 2021-06-26 DIAGNOSIS — I5043 Acute on chronic combined systolic (congestive) and diastolic (congestive) heart failure: Secondary | ICD-10-CM | POA: Diagnosis not present

## 2021-06-26 DIAGNOSIS — R2681 Unsteadiness on feet: Secondary | ICD-10-CM | POA: Diagnosis not present

## 2021-06-26 LAB — BASIC METABOLIC PANEL
BUN/Creatinine Ratio: 11 — ABNORMAL LOW (ref 12–28)
BUN: 13 mg/dL (ref 8–27)
CO2: 25 mmol/L (ref 20–29)
Calcium: 9.3 mg/dL (ref 8.7–10.3)
Chloride: 103 mmol/L (ref 96–106)
Creatinine, Ser: 1.14 mg/dL — ABNORMAL HIGH (ref 0.57–1.00)
Glucose: 116 mg/dL — ABNORMAL HIGH (ref 70–99)
Potassium: 3.9 mmol/L (ref 3.5–5.2)
Sodium: 141 mmol/L (ref 134–144)
eGFR: 52 mL/min/{1.73_m2} — ABNORMAL LOW (ref >59)

## 2021-06-26 LAB — PRO B NATRIURETIC PEPTIDE: NT-Pro BNP: 6337 pg/mL — ABNORMAL HIGH (ref 0–301)

## 2021-06-27 ENCOUNTER — Encounter (HOSPITAL_COMMUNITY): Payer: Self-pay | Admitting: Internal Medicine

## 2021-06-27 ENCOUNTER — Emergency Department (HOSPITAL_COMMUNITY): Payer: HMO

## 2021-06-27 ENCOUNTER — Inpatient Hospital Stay (HOSPITAL_COMMUNITY)
Admission: EM | Admit: 2021-06-27 | Discharge: 2021-07-07 | DRG: 286 | Disposition: A | Discharge status: Skilled Nursing Facility | Payer: HMO | Source: Skilled Nursing Facility | Attending: Internal Medicine | Admitting: Internal Medicine

## 2021-06-27 DIAGNOSIS — K219 Gastro-esophageal reflux disease without esophagitis: Secondary | ICD-10-CM | POA: Diagnosis present

## 2021-06-27 DIAGNOSIS — I480 Paroxysmal atrial fibrillation: Secondary | ICD-10-CM | POA: Diagnosis not present

## 2021-06-27 DIAGNOSIS — N183 Chronic kidney disease, stage 3 unspecified: Secondary | ICD-10-CM | POA: Diagnosis not present

## 2021-06-27 DIAGNOSIS — S065XAA Traumatic subdural hemorrhage with loss of consciousness status unknown, initial encounter: Secondary | ICD-10-CM | POA: Diagnosis present

## 2021-06-27 DIAGNOSIS — R2681 Unsteadiness on feet: Secondary | ICD-10-CM | POA: Diagnosis not present

## 2021-06-27 DIAGNOSIS — Y92129 Unspecified place in nursing home as the place of occurrence of the external cause: Secondary | ICD-10-CM | POA: Diagnosis not present

## 2021-06-27 DIAGNOSIS — Z888 Allergy status to other drugs, medicaments and biological substances status: Secondary | ICD-10-CM

## 2021-06-27 DIAGNOSIS — N1831 Chronic kidney disease, stage 3a: Secondary | ICD-10-CM

## 2021-06-27 DIAGNOSIS — Z20822 Contact with and (suspected) exposure to covid-19: Secondary | ICD-10-CM | POA: Diagnosis not present

## 2021-06-27 DIAGNOSIS — I272 Pulmonary hypertension, unspecified: Secondary | ICD-10-CM | POA: Diagnosis present

## 2021-06-27 DIAGNOSIS — Z952 Presence of prosthetic heart valve: Secondary | ICD-10-CM | POA: Diagnosis not present

## 2021-06-27 DIAGNOSIS — R2689 Other abnormalities of gait and mobility: Secondary | ICD-10-CM | POA: Diagnosis not present

## 2021-06-27 DIAGNOSIS — E1169 Type 2 diabetes mellitus with other specified complication: Secondary | ICD-10-CM | POA: Diagnosis not present

## 2021-06-27 DIAGNOSIS — E785 Hyperlipidemia, unspecified: Secondary | ICD-10-CM | POA: Diagnosis present

## 2021-06-27 DIAGNOSIS — F319 Bipolar disorder, unspecified: Secondary | ICD-10-CM | POA: Diagnosis present

## 2021-06-27 DIAGNOSIS — Z96651 Presence of right artificial knee joint: Secondary | ICD-10-CM | POA: Diagnosis present

## 2021-06-27 DIAGNOSIS — D631 Anemia in chronic kidney disease: Secondary | ICD-10-CM | POA: Diagnosis not present

## 2021-06-27 DIAGNOSIS — I5043 Acute on chronic combined systolic (congestive) and diastolic (congestive) heart failure: Secondary | ICD-10-CM | POA: Diagnosis not present

## 2021-06-27 DIAGNOSIS — R0602 Shortness of breath: Secondary | ICD-10-CM

## 2021-06-27 DIAGNOSIS — Z881 Allergy status to other antibiotic agents status: Secondary | ICD-10-CM

## 2021-06-27 DIAGNOSIS — I5033 Acute on chronic diastolic (congestive) heart failure: Secondary | ICD-10-CM | POA: Diagnosis not present

## 2021-06-27 DIAGNOSIS — M549 Dorsalgia, unspecified: Secondary | ICD-10-CM | POA: Diagnosis present

## 2021-06-27 DIAGNOSIS — R296 Repeated falls: Secondary | ICD-10-CM | POA: Diagnosis not present

## 2021-06-27 DIAGNOSIS — G43909 Migraine, unspecified, not intractable, without status migrainosus: Secondary | ICD-10-CM | POA: Diagnosis not present

## 2021-06-27 DIAGNOSIS — E876 Hypokalemia: Secondary | ICD-10-CM | POA: Diagnosis not present

## 2021-06-27 DIAGNOSIS — E039 Hypothyroidism, unspecified: Secondary | ICD-10-CM | POA: Diagnosis not present

## 2021-06-27 DIAGNOSIS — G8929 Other chronic pain: Secondary | ICD-10-CM | POA: Diagnosis present

## 2021-06-27 DIAGNOSIS — Z7901 Long term (current) use of anticoagulants: Secondary | ICD-10-CM

## 2021-06-27 DIAGNOSIS — J9601 Acute respiratory failure with hypoxia: Secondary | ICD-10-CM | POA: Diagnosis present

## 2021-06-27 DIAGNOSIS — M6281 Muscle weakness (generalized): Secondary | ICD-10-CM | POA: Diagnosis not present

## 2021-06-27 DIAGNOSIS — Z6831 Body mass index (BMI) 31.0-31.9, adult: Secondary | ICD-10-CM

## 2021-06-27 DIAGNOSIS — I495 Sick sinus syndrome: Secondary | ICD-10-CM | POA: Diagnosis present

## 2021-06-27 DIAGNOSIS — T501X6A Underdosing of loop [high-ceiling] diuretics, initial encounter: Secondary | ICD-10-CM | POA: Diagnosis present

## 2021-06-27 DIAGNOSIS — Z95828 Presence of other vascular implants and grafts: Secondary | ICD-10-CM | POA: Diagnosis not present

## 2021-06-27 DIAGNOSIS — Z882 Allergy status to sulfonamides status: Secondary | ICD-10-CM | POA: Diagnosis not present

## 2021-06-27 DIAGNOSIS — I4891 Unspecified atrial fibrillation: Secondary | ICD-10-CM | POA: Diagnosis present

## 2021-06-27 DIAGNOSIS — I130 Hypertensive heart and chronic kidney disease with heart failure and stage 1 through stage 4 chronic kidney disease, or unspecified chronic kidney disease: Secondary | ICD-10-CM | POA: Diagnosis not present

## 2021-06-27 DIAGNOSIS — Z87891 Personal history of nicotine dependence: Secondary | ICD-10-CM

## 2021-06-27 DIAGNOSIS — R0600 Dyspnea, unspecified: Secondary | ICD-10-CM | POA: Diagnosis not present

## 2021-06-27 DIAGNOSIS — R0989 Other specified symptoms and signs involving the circulatory and respiratory systems: Secondary | ICD-10-CM | POA: Diagnosis not present

## 2021-06-27 DIAGNOSIS — E538 Deficiency of other specified B group vitamins: Secondary | ICD-10-CM | POA: Diagnosis present

## 2021-06-27 DIAGNOSIS — F3130 Bipolar disorder, current episode depressed, mild or moderate severity, unspecified: Secondary | ICD-10-CM | POA: Diagnosis not present

## 2021-06-27 DIAGNOSIS — N179 Acute kidney failure, unspecified: Secondary | ICD-10-CM | POA: Diagnosis present

## 2021-06-27 DIAGNOSIS — I4892 Unspecified atrial flutter: Secondary | ICD-10-CM | POA: Diagnosis not present

## 2021-06-27 DIAGNOSIS — E1122 Type 2 diabetes mellitus with diabetic chronic kidney disease: Secondary | ICD-10-CM | POA: Diagnosis present

## 2021-06-27 DIAGNOSIS — Z8673 Personal history of transient ischemic attack (TIA), and cerebral infarction without residual deficits: Secondary | ICD-10-CM | POA: Diagnosis not present

## 2021-06-27 DIAGNOSIS — Z993 Dependence on wheelchair: Secondary | ICD-10-CM

## 2021-06-27 DIAGNOSIS — I517 Cardiomegaly: Secondary | ICD-10-CM | POA: Diagnosis not present

## 2021-06-27 DIAGNOSIS — Z950 Presence of cardiac pacemaker: Secondary | ICD-10-CM | POA: Diagnosis present

## 2021-06-27 DIAGNOSIS — I4819 Other persistent atrial fibrillation: Secondary | ICD-10-CM | POA: Diagnosis present

## 2021-06-27 DIAGNOSIS — I10 Essential (primary) hypertension: Secondary | ICD-10-CM | POA: Diagnosis present

## 2021-06-27 DIAGNOSIS — E669 Obesity, unspecified: Secondary | ICD-10-CM | POA: Diagnosis not present

## 2021-06-27 DIAGNOSIS — R000 Tachycardia, unspecified: Secondary | ICD-10-CM | POA: Diagnosis not present

## 2021-06-27 DIAGNOSIS — R0689 Other abnormalities of breathing: Secondary | ICD-10-CM | POA: Diagnosis not present

## 2021-06-27 DIAGNOSIS — Z91A3 Caregiver's unintentional underdosing of patient's medication regimen: Secondary | ICD-10-CM

## 2021-06-27 DIAGNOSIS — Z79899 Other long term (current) drug therapy: Secondary | ICD-10-CM

## 2021-06-27 HISTORY — DX: Abnormal electrocardiogram (ECG) (EKG): R94.31

## 2021-06-27 HISTORY — DX: Unspecified atrial fibrillation: I48.91

## 2021-06-27 HISTORY — DX: Repeated falls: R29.6

## 2021-06-27 HISTORY — DX: Traumatic subdural hemorrhage with loss of consciousness status unknown, initial encounter: S06.5XAA

## 2021-06-27 HISTORY — DX: Unspecified fall, initial encounter: W19.XXXA

## 2021-06-27 HISTORY — DX: Aneurysm of the ascending aorta, without rupture: I71.21

## 2021-06-27 HISTORY — DX: Nontraumatic subarachnoid hemorrhage, unspecified: I60.9

## 2021-06-27 HISTORY — DX: Presence of cardiac pacemaker: Z95.0

## 2021-06-27 HISTORY — DX: Chronic diastolic (congestive) heart failure: I50.32

## 2021-06-27 HISTORY — DX: Unspecified atrial flutter: I48.92

## 2021-06-27 HISTORY — DX: Bradycardia, unspecified: R00.1

## 2021-06-27 HISTORY — DX: Type 2 diabetes mellitus without complications: E11.9

## 2021-06-27 HISTORY — DX: Acute respiratory failure with hypoxia: J96.01

## 2021-06-27 LAB — BASIC METABOLIC PANEL
Anion gap: 11 (ref 5–15)
BUN: 21 mg/dL (ref 8–23)
CO2: 24 mmol/L (ref 22–32)
Calcium: 8.9 mg/dL (ref 8.9–10.3)
Chloride: 103 mmol/L (ref 98–111)
Creatinine, Ser: 1.11 mg/dL — ABNORMAL HIGH (ref 0.44–1.00)
GFR, Estimated: 53 mL/min — ABNORMAL LOW (ref >60)
Glucose, Bld: 134 mg/dL — ABNORMAL HIGH (ref 70–99)
Potassium: 3.8 mmol/L (ref 3.5–5.1)
Sodium: 138 mmol/L (ref 135–145)

## 2021-06-27 LAB — CBC WITH DIFFERENTIAL/PLATELET
Abs Immature Granulocytes: 0.03 10*3/uL (ref 0.00–0.07)
Basophils Absolute: 0.1 10*3/uL (ref 0.0–0.1)
Basophils Relative: 2 %
Eosinophils Absolute: 0.2 10*3/uL (ref 0.0–0.5)
Eosinophils Relative: 2 %
HCT: 33.1 % — ABNORMAL LOW (ref 36.0–46.0)
Hemoglobin: 10.6 g/dL — ABNORMAL LOW (ref 12.0–15.0)
Immature Granulocytes: 0 %
Lymphocytes Relative: 13 %
Lymphs Abs: 1.1 10*3/uL (ref 0.7–4.0)
MCH: 30.1 pg (ref 26.0–34.0)
MCHC: 32 g/dL (ref 30.0–36.0)
MCV: 94 fL (ref 80.0–100.0)
Monocytes Absolute: 0.4 10*3/uL (ref 0.1–1.0)
Monocytes Relative: 5 %
Neutro Abs: 6.4 10*3/uL (ref 1.7–7.7)
Neutrophils Relative %: 78 %
Platelets: 282 10*3/uL (ref 150–400)
RBC: 3.52 MIL/uL — ABNORMAL LOW (ref 3.87–5.11)
RDW: 15.7 % — ABNORMAL HIGH (ref 11.5–15.5)
WBC: 8.2 10*3/uL (ref 4.0–10.5)
nRBC: 0 % (ref 0.0–0.2)

## 2021-06-27 LAB — I-STAT VENOUS BLOOD GAS, ED
Acid-Base Excess: 1 mmol/L (ref 0.0–2.0)
Bicarbonate: 25.9 mmol/L (ref 20.0–28.0)
Calcium, Ion: 1.11 mmol/L — ABNORMAL LOW (ref 1.15–1.40)
HCT: 34 % — ABNORMAL LOW (ref 36.0–46.0)
Hemoglobin: 11.6 g/dL — ABNORMAL LOW (ref 12.0–15.0)
O2 Saturation: 99 %
Potassium: 3.9 mmol/L (ref 3.5–5.1)
Sodium: 139 mmol/L (ref 135–145)
TCO2: 27 mmol/L (ref 22–32)
pCO2, Ven: 40.8 mmHg — ABNORMAL LOW (ref 44.0–60.0)
pH, Ven: 7.41 (ref 7.250–7.430)
pO2, Ven: 141 mmHg — ABNORMAL HIGH (ref 32.0–45.0)

## 2021-06-27 LAB — I-STAT CHEM 8, ED
BUN: 21 mg/dL (ref 8–23)
Calcium, Ion: 1.08 mmol/L — ABNORMAL LOW (ref 1.15–1.40)
Chloride: 104 mmol/L (ref 98–111)
Creatinine, Ser: 1.1 mg/dL — ABNORMAL HIGH (ref 0.44–1.00)
Glucose, Bld: 130 mg/dL — ABNORMAL HIGH (ref 70–99)
HCT: 34 % — ABNORMAL LOW (ref 36.0–46.0)
Hemoglobin: 11.6 g/dL — ABNORMAL LOW (ref 12.0–15.0)
Potassium: 3.9 mmol/L (ref 3.5–5.1)
Sodium: 140 mmol/L (ref 135–145)
TCO2: 28 mmol/L (ref 22–32)

## 2021-06-27 LAB — RESP PANEL BY RT-PCR (FLU A&B, COVID) ARPGX2
Influenza A by PCR: NEGATIVE
Influenza B by PCR: NEGATIVE
SARS Coronavirus 2 by RT PCR: NEGATIVE

## 2021-06-27 LAB — PROTIME-INR
INR: 2.7 — ABNORMAL HIGH (ref 0.8–1.2)
Prothrombin Time: 28.7 seconds — ABNORMAL HIGH (ref 11.4–15.2)

## 2021-06-27 LAB — TROPONIN I (HIGH SENSITIVITY): Troponin I (High Sensitivity): 54 ng/L — ABNORMAL HIGH (ref <18)

## 2021-06-27 LAB — BRAIN NATRIURETIC PEPTIDE: B Natriuretic Peptide: 854.4 pg/mL — ABNORMAL HIGH (ref 0.0–100.0)

## 2021-06-27 LAB — C-REACTIVE PROTEIN: CRP: 0.6 mg/dL (ref <1.0)

## 2021-06-27 LAB — TSH: TSH: 2.578 u[IU]/mL (ref 0.350–4.500)

## 2021-06-27 MED ORDER — FUROSEMIDE 10 MG/ML IJ SOLN
20.0000 mg | Freq: Once | INTRAMUSCULAR | Status: AC
Start: 2021-06-27 — End: 2021-06-27
  Administered 2021-06-27: 20 mg via INTRAVENOUS
  Filled 2021-06-27: qty 2

## 2021-06-27 MED ORDER — HYDRALAZINE HCL 20 MG/ML IJ SOLN: 10.0000 mg | Freq: Four times a day (QID) | INTRAMUSCULAR | Status: DC | PRN

## 2021-06-27 MED ORDER — VITAMIN D 25 MCG (1000 UNIT) PO TABS
1000.0000 [IU] | ORAL_TABLET | Freq: Every day | ORAL | Status: DC
  Administered 2021-06-28 – 2021-07-07 (×10): 1000 [IU] via ORAL
  Filled 2021-06-27 (×18): qty 1

## 2021-06-27 MED ORDER — SODIUM CHLORIDE 0.9% FLUSH
3.0000 mL | Freq: Two times a day (BID) | INTRAVENOUS | Status: DC
  Administered 2021-06-27 – 2021-07-03 (×11): 3 mL via INTRAVENOUS

## 2021-06-27 MED ORDER — ALBUTEROL SULFATE (2.5 MG/3ML) 0.083% IN NEBU: 3.0000 mL | INHALATION_SOLUTION | Freq: Four times a day (QID) | RESPIRATORY_TRACT | Status: DC | PRN

## 2021-06-27 MED ORDER — BISACODYL 5 MG PO TBEC: 5.0000 mg | DELAYED_RELEASE_TABLET | Freq: Every day | ORAL | Status: DC | PRN

## 2021-06-27 MED ORDER — LEVOTHYROXINE SODIUM 25 MCG PO TABS
25.0000 ug | ORAL_TABLET | Freq: Every day | ORAL | Status: DC
  Administered 2021-06-28 – 2021-07-07 (×10): 25 ug via ORAL
  Filled 2021-06-27 (×10): qty 1

## 2021-06-27 MED ORDER — IPRATROPIUM-ALBUTEROL 0.5-2.5 (3) MG/3ML IN SOLN
3.0000 mL | Freq: Once | RESPIRATORY_TRACT | Status: AC
  Administered 2021-06-27: 3 mL via RESPIRATORY_TRACT
  Filled 2021-06-27: qty 3

## 2021-06-27 MED ORDER — POTASSIUM CHLORIDE CRYS ER 20 MEQ PO TBCR: 40.0000 meq | EXTENDED_RELEASE_TABLET | Freq: Once | ORAL | Status: DC

## 2021-06-27 MED ORDER — CARVEDILOL 3.125 MG PO TABS
3.1250 mg | ORAL_TABLET | Freq: Two times a day (BID) | ORAL | Status: DC
  Administered 2021-06-27: 3.125 mg via ORAL
  Filled 2021-06-27 (×3): qty 1

## 2021-06-27 MED ORDER — ACETAMINOPHEN 650 MG RE SUPP: 650.0000 mg | Freq: Four times a day (QID) | RECTAL | Status: DC | PRN

## 2021-06-27 MED ORDER — METHYLPREDNISOLONE SODIUM SUCC 125 MG IJ SOLR: 125.0000 mg | Freq: Once | INTRAMUSCULAR | Status: DC

## 2021-06-27 MED ORDER — ONDANSETRON HCL 4 MG PO TABS: 4.0000 mg | ORAL_TABLET | Freq: Four times a day (QID) | ORAL | Status: DC | PRN

## 2021-06-27 MED ORDER — VITAMIN B-12 1000 MCG PO TABS
1000.0000 ug | ORAL_TABLET | Freq: Every day | ORAL | Status: DC
  Administered 2021-06-28 – 2021-07-07 (×10): 1000 ug via ORAL
  Filled 2021-06-27 (×10): qty 1

## 2021-06-27 MED ORDER — ONDANSETRON HCL 4 MG/2ML IJ SOLN
4.0000 mg | Freq: Four times a day (QID) | INTRAMUSCULAR | Status: DC | PRN
  Administered 2021-06-28 – 2021-07-03 (×2): 4 mg via INTRAVENOUS
  Filled 2021-06-27: qty 2

## 2021-06-27 MED ORDER — SUMATRIPTAN SUCCINATE 50 MG PO TABS
50.0000 mg | ORAL_TABLET | ORAL | Status: DC | PRN
  Filled 2021-06-27: qty 1

## 2021-06-27 MED ORDER — QUETIAPINE FUMARATE ER 200 MG PO TB24
200.0000 mg | ORAL_TABLET | Freq: Every evening | ORAL | Status: DC
Start: 2021-06-27 — End: 2021-06-27

## 2021-06-27 MED ORDER — WARFARIN SODIUM 2.5 MG PO TABS
3.5000 mg | ORAL_TABLET | Freq: Once | ORAL | Status: AC
  Administered 2021-06-27: 3.5 mg via ORAL
  Filled 2021-06-27: qty 1

## 2021-06-27 MED ORDER — DOCUSATE SODIUM 100 MG PO CAPS
100.0000 mg | ORAL_CAPSULE | Freq: Two times a day (BID) | ORAL | Status: DC
  Administered 2021-06-29 – 2021-07-07 (×6): 100 mg via ORAL
  Filled 2021-06-27 (×15): qty 1

## 2021-06-27 MED ORDER — POTASSIUM CHLORIDE CRYS ER 20 MEQ PO TBCR
40.0000 meq | EXTENDED_RELEASE_TABLET | Freq: Once | ORAL | Status: AC
  Administered 2021-06-27: 40 meq via ORAL
  Filled 2021-06-27: qty 2

## 2021-06-27 MED ORDER — ALBUTEROL SULFATE (2.5 MG/3ML) 0.083% IN NEBU: 2.5000 mg | INHALATION_SOLUTION | Freq: Four times a day (QID) | RESPIRATORY_TRACT | Status: DC

## 2021-06-27 MED ORDER — ACETAMINOPHEN 325 MG PO TABS
650.0000 mg | ORAL_TABLET | Freq: Four times a day (QID) | ORAL | Status: DC | PRN
  Administered 2021-06-29 – 2021-07-07 (×2): 650 mg via ORAL
  Filled 2021-06-27 (×2): qty 2

## 2021-06-27 MED ORDER — ROSUVASTATIN CALCIUM 20 MG PO TABS
40.0000 mg | ORAL_TABLET | Freq: Every day | ORAL | Status: DC
  Administered 2021-06-27 – 2021-07-07 (×11): 40 mg via ORAL
  Filled 2021-06-27 (×11): qty 2

## 2021-06-27 MED ORDER — FUROSEMIDE 10 MG/ML IJ SOLN
60.0000 mg | Freq: Once | INTRAMUSCULAR | Status: AC
  Administered 2021-06-27: 60 mg via INTRAVENOUS
  Filled 2021-06-27: qty 6

## 2021-06-27 MED ORDER — WARFARIN - PHARMACIST DOSING INPATIENT: Freq: Every day | Status: DC

## 2021-06-27 MED ORDER — QUETIAPINE FUMARATE 25 MG PO TABS
25.0000 mg | ORAL_TABLET | Freq: Every day | ORAL | Status: DC
Start: 2021-06-27 — End: 2021-06-27

## 2021-06-27 MED ORDER — LAMOTRIGINE 100 MG PO TABS
200.0000 mg | ORAL_TABLET | Freq: Every day | ORAL | Status: DC
  Administered 2021-06-27 – 2021-07-06 (×10): 200 mg via ORAL
  Filled 2021-06-27 (×10): qty 2

## 2021-06-27 MED ORDER — DIPHENHYDRAMINE HCL 25 MG PO CAPS
50.0000 mg | ORAL_CAPSULE | Freq: Once | ORAL | Status: AC
Start: 2021-06-28 — End: 2021-06-27
  Administered 2021-06-27: 50 mg via ORAL
  Filled 2021-06-27: qty 2

## 2021-06-27 MED ORDER — FENOFIBRATE 160 MG PO TABS
160.0000 mg | ORAL_TABLET | Freq: Every day | ORAL | Status: DC
  Administered 2021-06-28 – 2021-07-07 (×10): 160 mg via ORAL
  Filled 2021-06-27 (×10): qty 1

## 2021-06-27 MED ORDER — MAGNESIUM SULFATE IN D5W 1-5 GM/100ML-% IV SOLN
1.0000 g | Freq: Once | INTRAVENOUS | Status: AC
  Administered 2021-06-27: 1 g via INTRAVENOUS
  Filled 2021-06-27: qty 100

## 2021-06-27 MED ORDER — LAMOTRIGINE 100 MG PO TABS
100.0000 mg | ORAL_TABLET | Freq: Every day | ORAL | Status: DC
  Administered 2021-06-28 – 2021-07-07 (×10): 100 mg via ORAL
  Filled 2021-06-27 (×10): qty 1

## 2021-06-27 MED ORDER — PANTOPRAZOLE SODIUM 40 MG PO TBEC
40.0000 mg | DELAYED_RELEASE_TABLET | Freq: Every day | ORAL | Status: DC
  Administered 2021-06-27 – 2021-07-07 (×11): 40 mg via ORAL
  Filled 2021-06-27 (×10): qty 1

## 2021-06-27 NOTE — H&P (Signed)
TRH H&P   Patient Demographics:    [name], is a 71 y.o. female  MRN: [id number]   DOB - June 21, 1950  Admit Date - 06/27/2021  Outpatient Primary MD for the patient is Mosie Lukes, MD    Patient coming from: SNF  Chief Complaint  Patient presents with   Shortness of Breath           HPI:    [name]  is a 71 y.o. female, with history of mechanical Saint Jude aortic valve placed in 2011, paroxysmal atrial fibrillation [place] vas 2 score of greater than 5 with embolic strokes, multiple falls with subdural bleed requiring craniotomy, DM type II, bipolar disorder, migraine, hypertension, syncope, tachybradycardia syndrome s/p pacemaker placement, dyslipidemia, who lives in a nursing home and has been experiencing some shortness of breath for the last 7 to 10 days which is gradually progressive associated with a cough with white phlegm, no fever or chills, no chest pain or palpitations.  Shortness of breath is worse with exertion and laying flat better with rest and sitting up, apparently at the SNF her Lasix medication was missed for several days due to some miscommunication.    She presented to the ER with gradually progressive shortness of breath was diagnosed with acute on chronic diastolic CHF, she received Lasix was placed on BiPAP, cardiology was consulted and I was requested to admit the patient.   Currently she denies any headache, no chest or abdominal pain, no palpitations, shortness of breath is quite improved, denies any blood in stool or urine, no diarrhea or dysuria, no new focal weakness.   Review of systems:    A full 10 point Review of Systems was done, except as stated above, all other Review of  Systems were negative.   With Past History of the following :    Past Medical History:  Diagnosis Date   ANEMIA 05/28/2010   AORTIC VALVE REPLACEMENT, HX OF 03/11/2010   Mechanical prosthesis   BIPOLAR AFFECTIVE DISORDER 03/11/2010   Depression    Diverticulitis 12/18/2010   FIBROIDS, UTERUS 03/11/2010   Gout 09/04/2016   Grave's disease 8-12   Hyperlipidemia associated with type 2 diabetes mellitus ([place]) 06/05/2016   Hypertension    Low back pain 11/02/2017   Migraine 06/05/2016   Mixed hyperlipidemia 03/11/2010   Obesity  Skin cancer    Syncope 08/22/2019   UTI (lower urinary tract infection) 08/14/2011      Past Surgical History:  Procedure Laterality Date   ABDOMINAL HYSTERECTOMY  ?1998   sb/l spo, total for fibroids/heavy bleeding   ANKLE SURGERY Left 01/2018   hardware removal   AORTIC VALVE REPLACEMENT  2011   Mechanical prosthesis, St. Jude   APPENDECTOMY     Required revision for EColi infection, required recurrent packing   bowel obstruction     Requiring adhesions to be removed   CARDIOVERSION N/A 05/09/2020   Procedure: CARDIOVERSION;  Surgeon: [name], MD;  Location: [place];  Service: Cardiovascular;  Laterality: N/A;   CHOLECYSTECTOMY     COLONOSCOPY WITH PROPOFOL N/A 11/01/2015   Procedure: COLONOSCOPY WITH PROPOFOL;  Surgeon: Milus Banister, MD;  Location: [facility];  Service: Endoscopy;  Laterality: N/A;   CYSTOCELE REPAIR N/A 10/16/2015   Procedure: ANTERIOR REPAIR (CYSTOCELE);  Surgeon: Donnamae Jude, MD;  Location: [facility];  Service: Gynecology;  Laterality: N/A;   HERNIA REPAIR  08-16-10   LEFT HEART CATHETERIZATION WITH CORONARY ANGIOGRAM N/A 12/05/2011   Procedure: LEFT HEART CATHETERIZATION WITH CORONARY ANGIOGRAM;  Surgeon: Sinclair Grooms, MD;  Location: [facility];  Service: Cardiovascular;  Laterality: N/A;   PACEMAKER IMPLANT N/A 01/09/2020   Procedure: PACEMAKER IMPLANT;  Surgeon: Evans Lance, MD;  Location: [place] [facility];  Service: Cardiovascular;  Laterality: N/A;   TOTAL KNEE ARTHROPLASTY Right 02/24/2013   Procedure: RIGHT TOTAL KNEE ARTHROPLASTY;  Surgeon: Johnn Hai, MD;  Location: [facility];  Service: Orthopedics;  Laterality: Right;   TUBAL LIGATION     varicose vein surgery b/l legs        Social History:     Social History   Tobacco Use   Smoking status: Former    Types: Cigarettes    Quit date: 05/26/1990    Years since quitting: 31.1   Smokeless tobacco: Never  Substance Use Topics   Alcohol use: Yes    Alcohol/week: 0.0 standard drinks    Comment: rare         Family History :     Family History  Problem Relation Age of Onset   Scoliosis Mother    Arthritis Mother        Rheumatoid   Aneurysm Mother        heart   Alcohol abuse Father    Depression Sister    Depression Brother    Alcohol abuse Brother    Cancer Maternal Grandmother        colon   Diabetes Maternal Grandfather    Heart disease Maternal Grandfather    Alcohol abuse Maternal Grandfather    Depression Paternal Grandmother    Diabetes Paternal Grandmother    Depression Paternal Grandfather    Diabetes Paternal Grandfather    Depression Sister    Alcohol abuse Brother    Depression Brother    Heart disease Brother       Home Medications:   Prior to Admission medications   Medication Sig Start Date End Date Taking? Authorizing Provider  acetaminophen (TYLENOL) 325 MG tablet Take 325 mg by mouth every 4 (four) hours as needed for mild pain.    [provider]  amoxicillin (AMOXIL) 500 MG capsule Take 1 capsule (500 mg total) by mouth 3 (three) times daily. 06/22/20   Mosie Lukes, MD  Azelastine HCl 137 MCG/SPRAY SOLN Place into both nostrils.  11/15/20   [provider]  butalbital-acetaminophen-caffeine (FIORICET) 50-325-40 MG tablet Take 1 tablet by mouth as needed for migraine. 03/08/20   [provider]  carvedilol (COREG) 6.25 MG tablet Take 1 tablet (6.25 mg  total) by mouth 2 (two) times daily. 06/24/21   Josue Hector, MD  Cholecalciferol (VITAMIN D3) 1.25 MG (50000 UT) TABS Take 1 tablet by mouth daily.    [provider]  docusate sodium (COLACE) 100 MG capsule Take 1 capsule (100 mg total) by mouth 2 (two) times daily. 07/25/20   [place] Eric J, DO  fenofibrate 160 MG tablet Take 1 tablet (160 mg total) by mouth daily. 07/25/20   [place] Donnamarie Poag, DO  furosemide (LASIX) 40 MG tablet Take 1 tablet (40 mg total) by mouth daily. 06/24/21   Josue Hector, MD  glucose blood [facility] VERIO) test strip Check blood sugars once daily Dx: E11.9 05/03/20   Mosie Lukes, MD  insulin aspart (NOVOLOG) 100 UNIT/ML injection Inject 0-12 Units into the skin See admin instructions. Inject 0-12 units into the skin three times a day before meals, per sliding scale: BGL <70 = CALL MD; 70-200 = give nothing; 201-250 = 2 units; 251-300 = 4 units; 301-350 = 6 units; 351-400 = 8 units; 401-450 = 10 units; 451-600 = 12 units; >450 = 12 units and re-check in two hours- call MD if still elevated    [provider]  lamoTRIgine (LAMICTAL) 200 MG tablet Take 200 mg by mouth daily. 12/13/20   [provider]  Lancets (ONETOUCH ULTRASOFT) lancets Use as instructed to test blood sugars once daily Dx. E11.65 05/03/20   Mosie Lukes, MD  levothyroxine (SYNTHROID) 25 MCG tablet TAKE 1 TABLET BY MOUTH DAILY BEFORE BREAKFAST. 08/22/20   Mosie Lukes, MD  nystatin cream (MYCOSTATIN) Apply 1 application topically 2 (two) times daily. 05/03/20   Mosie Lukes, MD  OXcarbazepine (TRILEPTAL) 150 MG tablet Take 1 tablet (150 mg total) by mouth 2 (two) times daily. 07/25/20   [place] Eric J, DO  pantoprazole (PROTONIX) 40 MG tablet Take 1 tablet (40 mg total) by mouth daily. 07/25/20   [place] Donnamarie Poag, DO  polyvinyl alcohol (LIQUIFILM TEARS) 1.4 % ophthalmic solution Place 1 drop into both eyes every 4 (four) hours as needed for dry eyes.    [provider]   QUEtiapine (SEROQUEL XR) 200 MG 24 hr tablet Take 2 tablets (400 mg total) by mouth every evening. 05/16/20   Thayer Headings, PMHNP  QUEtiapine (SEROQUEL) 25 MG tablet Take 25 mg by mouth at bedtime.    [provider]  rosuvastatin (CRESTOR) 40 MG tablet Take 40 mg by mouth daily.    [provider]  saccharomyces boulardii (FLORASTOR) 250 MG capsule Take 1 capsule (250 mg total) by mouth daily. 07/25/20   [place] Eric J, DO  SUMAtriptan (IMITREX) 50 MG tablet TAKE ONE TABLET EVERY 2 HOURS AS NEEDED FOR MIGRAINE. MAY REPEAT IN 2 HOURS IF HEADACHE PERSISTS OR RECURS. (MAX OF 2 TABLETS IN 24 HOURS) 12/01/17   Mosie Lukes, MD  SUMAtriptan (IMITREX) 50 MG tablet Take 1 tablet (50 mg total) by mouth every 2 (two) hours as needed for migraine. May repeat in 2 hours if headache persists or recurs. 07/25/20   [place] Donnamarie Poag, DO  traZODone (DESYREL) 50 MG tablet Take 100 mg by mouth at bedtime. 10/04/20   [provider]  venlafaxine XR (EFFEXOR XR) 75 MG 24 hr capsule  Take 1 capsule (75 mg total) by mouth daily with breakfast. 10/11/19 04/29/20  Thayer Headings, PMHNP  vitamin B-12 1000 MCG tablet Take 1 tablet (1,000 mcg total) by mouth daily. 04/01/20   Geradine Girt, DO  warfarin (COUMADIN) 1 MG tablet Take 1 tablet by mouth daily. Use as directed 10/01/20   [provider]  warfarin (COUMADIN) 3 MG tablet Take 3 mg by mouth daily.    [provider]  warfarin (COUMADIN) 4 MG tablet Take 4 mg by mouth daily. Use as directed 10/05/20   [provider]  warfarin (COUMADIN) 5 MG tablet Take as directed by [facility]. 09/15/19   Belva Crome, MD  methimazole (TAPAZOLE) 5 MG tablet Take 1 tablet (5 mg total) by mouth 3 (three) times daily. 06/24/11 08/31/19  Renato Shin, MD     Allergies:     Allergies  Allergen Reactions   Mucinex [Guaifenesin Er] Other (See Comments)    Severe headaches    Keflex [Cephalexin] Other (See Comments)    Headaches  and dizziness   Keflex [Cephalexin] Other (See Comments)    Headaches and/or dizziness- "allergic," per MAR   Mucinex [Guaifenesin Er] Other (See Comments)    Severe headaches- "allergic," per MAR   Sulfa Antibiotics Other (See Comments)    headaches   Sulfa Antibiotics Other (See Comments)    Headaches- "allergic," per MAR    Sulfonamide Derivatives Other (See Comments)    Headaches      Physical Exam:   Vitals  Blood pressure (!) 133/111, pulse (!) 101, temperature 98 F (36.7 C), temperature source Oral, resp. rate 20, height 5\' 8"  (1.727 m), weight 96.6 kg, SpO2 100 %.   1. General elderly obese Caucasian female lying in hospital bed in mild respiratory distress wearing nasal cannula oxygen,  2. Normal affect and insight, Not Suicidal or Homicidal, Awake Alert,   3. No F.N deficits, ALL C.Nerves Intact, Strength 5/5 all 4 extremities, Sensation intact all 4 extremities, Plantars down going.  4. Ears and Eyes appear Normal, Conjunctivae clear, PERRLA. Moist Oral Mucosa.  5. Supple Neck, No JVD, No cervical lymphadenopathy appriciated, No Carotid Bruits.  6. Symmetrical Chest wall movement, Good air movement bilaterally, bilateral crackles,  7. RRR, No Gallops, loud systolic heart murmur, 8. Positive Bowel Sounds, Abdomen Soft, No tenderness, No organomegaly appriciated,No rebound -guarding or rigidity.  9.  No Cyanosis, Normal Skin Turgor, No Skin Rash or Bruise.  10. Good muscle tone,  joints appear normal , no effusions, Normal ROM.  11. No Palpable Lymph Nodes in Neck or Axillae      Data Review:    CBC Recent Labs  Lab 06/27/21 1443 06/27/21 1503 06/27/21 1504  WBC 8.2  --   --   HGB 10.6* 11.6* 11.6*  HCT 33.1* 34.0* 34.0*  PLT 282  --   --   MCV 94.0  --   --   MCH 30.1  --   --   MCHC 32.0  --   --   RDW 15.7*  --   --   LYMPHSABS 1.1  --   --   MONOABS 0.4  --   --   EOSABS 0.2  --   --   BASOSABS 0.1  --   --     ------------------------------------------------------------------------------------------------------------------  Chemistries  Recent Labs  Lab 06/24/21 1139 06/27/21 1443 06/27/21 1503 06/27/21 1504  NA 141 138 139 140  K 3.9 3.8 3.9 3.9  CL 103  103  --  104  CO2 25 24  --   --   GLUCOSE 116* 134*  --  130*  BUN 13 21  --  21  CREATININE 1.14* 1.11*  --  1.10*  CALCIUM 9.3 8.9  --   --    ------------------------------------------------------------------------------------------------------------------ estimated creatinine clearance is 57.8 mL/min (A) (by C-G formula based on SCr of 1.1 mg/dL (H)). ------------------------------------------------------------------------------------------------------------------ No results for input(s): TSH, T4TOTAL, T3FREE, THYROIDAB in the last 72 hours.  Invalid input(s): FREET3  Coagulation profile No results for input(s): INR, PROTIME in the last 168 hours. ------------------------------------------------------------------------------------------------------------------- No results for input(s): DDIMER in the last 72 hours. -------------------------------------------------------------------------------------------------------------------  Cardiac Enzymes No results for input(s): CKMB, TROPONINI, MYOGLOBIN in the last 168 hours.  Invalid input(s): CK ------------------------------------------------------------------------------------------------------------------    Component Value Date/Time   BNP 854.4 (H) 06/27/2021 1446    ------------------------------------------------------------------------------------   Imaging Results:    DG Chest Port 1 View  Result Date: 06/27/2021 CLINICAL DATA:  Shortness of breath. Progressive worsening over 3 weeks. EXAM: PORTABLE CHEST 1 VIEW COMPARISON:  AP chest views 07/23/2020 FINDINGS: Left chest wall cardiac pacer is again seen with leads overlying the right atrium and right ventricle.  Status post median sternotomy. Cardiac silhouette is again moderately enlarged. Cardiac valve prosthesis is again noted overlying the mid heart. Mediastinal contours are grossly within normal limits. Mild interval increase in mild-to-moderate interstitial thickening bilaterally. Possible tiny bilateral pleural effusions. No pneumothorax. No acute skeletal abnormality. Right axillary surgical clips are again noted. IMPRESSION: Interval increase in mild-to-moderate interstitial thickening likely mild to moderate interstitial pulmonary edema. Stable cardiomegaly. Electronically Signed   By: Yvonne Kendall M.D.   On: 06/27/2021 14:54    My personal review of EKG: Rhythm Afib 111/min , no Acute ST changes   Assessment & Plan:   Assessment and Plan: No notes have been filed under this hospital service. Service: Hospitalist   1.  Acute hypoxic respiratory failure brought on by acute on chronic diastolic CHF EF 79% on last echocardiogram.  This likely was due to miscommunication at the SNF and missing of her Lasix for the last several days.  She was initially placed on BiPAP, now she has received Lasix with good 1-1/2 L of diuresis thus far and improvement in her shortness of breath.  Continue diuresis, supplemental oxygen, nebulizer treatments if needed.  Cardiology has been consulted.  We will continue low-dose beta-blocker and monitor.  2.  Paroxysmal atrial fibrillation with [place] vas 2 score of greater than 5 with history of embolic strokes.  Coreg and Coumadin combination to be continued, pharmacy monitoring INR.  3.  History of embolic strokes due to A. fib with multiple falls and subdural hematoma and bleeds.  Supportive care, PT OT high fall risk.  Post discharge back to SNF.  4.  Dyslipidemia.  Continue home dose statin  5.  Hypothyroidism.  Continue home dose Synthroid.  6.  Hypertension.  Continue Coreg and diuresis with Lasix and monitor.  7.  GERD.  On PPI continue.  8.  CKD 3A.   Baseline creatinine of 1.2.  Monitor.  9.  History of mechanical Saint Jude aortic valve placed in 2011.  Continue with supportive care.  She is on Coumadin but most likely due to A. fib with embolic strokes.  10.  History of migraine headaches.  Monitor with supportive care.  11.  History of delirium.  She seems to be on extremely high doses of Seroquel, will have pharmacy reconcile her  home medications and then address.  Will be at risk for encephalopathy in the hospital setting.  Will follow fall precautions and aspiration precautions while here, minimize narcotics and benzodiazepine use.   DVT Prophylaxis Couamdin  AM Labs Ordered, also please review Full Orders  Family Communication: Admission, patients condition and plan of care including tests being ordered have been discussed with the patient and daughter Warren Lacy [phone] who indicate understanding and agree with the plan and Code Status.  Code Status Full  Likely DC to  SNF  Condition GUARDED    Consults called: Cards    Admission status: Inpt    Time spent in minutes : 35   Lala Lund M.D on 06/27/2021 at 4:45 PM  To page go to [email or web address] - password [facility]

## 2021-06-27 NOTE — Progress Notes (Signed)
ANTICOAGULATION CONSULT NOTE - Initial Consult  Pharmacy Consult for warfarin Indication: atrial fibrillation  Allergies  Allergen Reactions   Mucinex [Guaifenesin Er] Other (See Comments)    Severe headaches    Keflex [Cephalexin] Other (See Comments)    Headaches and dizziness   Keflex [Cephalexin] Other (See Comments)    Headaches and/or dizziness- "allergic," per MAR   Mucinex [Guaifenesin Er] Other (See Comments)    Severe headaches- "allergic," per MAR   Sulfa Antibiotics Other (See Comments)    headaches   Sulfa Antibiotics Other (See Comments)    Headaches- "allergic," per [facility]    Sulfonamide Derivatives Other (See Comments)    Headaches     Patient Measurements: Height: 5\' 8"  (172.7 cm) Weight: 96.6 kg (213 lb) IBW/kg (Calculated) : 63.9 Heparin Dosing Weight: 84.9 kg   Vital Signs: Temp: 98 F (36.7 C) (02/02 1409) Temp Source: Oral (02/02 1409) BP: 133/111 (02/02 1545) Pulse Rate: 101 (02/02 1545)  Labs: Recent Labs    06/27/21 1443 06/27/21 1503 06/27/21 1504  HGB 10.6* 11.6* 11.6*  HCT 33.1* 34.0* 34.0*  PLT 282  --   --   CREATININE 1.11*  --  1.10*  TROPONINIHS 54*  --   --     Estimated Creatinine Clearance: 57.8 mL/min (A) (by C-G formula based on SCr of 1.1 mg/dL (H)).   Medical History: Past Medical History:  Diagnosis Date   ANEMIA 05/28/2010   AORTIC VALVE REPLACEMENT, HX OF 03/11/2010   Mechanical prosthesis   BIPOLAR AFFECTIVE DISORDER 03/11/2010   Depression    Diverticulitis 12/18/2010   FIBROIDS, UTERUS 03/11/2010   Gout 09/04/2016   Grave's disease 8-12   Hyperlipidemia associated with type 2 diabetes mellitus ([place]) 06/05/2016   Hypertension    Low back pain 11/02/2017   Migraine 06/05/2016   Mixed hyperlipidemia 03/11/2010   Obesity    Skin cancer    Syncope 08/22/2019   UTI (lower urinary tract infection) 08/14/2011    Medications:  (Not in a hospital admission)   Assessment: 54 YOF with h/o recurrent AF and h/o  mechanical AV valve replacement on warfarin at facility. Pharmacy consulted to resume warfarin. Per facility records, patient's last dose of warfarin was on 1/31  INR on admission is therapeutic at 2.7. H/H slightly low, Plt wnl   Patient's records suggest he was on 4 mg daily but dose was recently reduced to 3.5 mg daily.   Goal of Therapy:  INR 2.5-3.5  Monitor platelets by anticoagulation protocol: Yes   Plan:  -Warfarin 3.5 mg x 1 dose tonight  -Monitor daily PT/INR -Monitor for s/s of bleeding   [name], PharmD., BCCCP Clinical Pharmacist Please refer to [facility] for unit-specific pharmacist

## 2021-06-27 NOTE — Progress Notes (Signed)
Pt placed on Bipap by RT. Pt tolerating well at this time, RN aware,MD aware, RT will continue to monitor.

## 2021-06-27 NOTE — Progress Notes (Signed)
RT took pt off  Bipap and placed pt on 3L [place]. Pt tolerating well at this time, RN aware, RT will continue to monitor.

## 2021-06-27 NOTE — ED Triage Notes (Signed)
Progressive worsening of sob x 3 weeks. Not on home O2. 92% on RA. EMS placed pt on 3LPM [place] at 98%. Afib on the monitor with HR 130s. Alert and oriented x 4.

## 2021-06-27 NOTE — Consult Note (Signed)
Cardiology Consultation:   Patient ID: [name] MRN: [id number]; DOB: May 26, 1951  Admit date: 06/27/2021 Date of Consult: 06/27/2021  PCP:  Mosie Lukes, MD   [facility] Providers Cardiologist:  Sinclair Grooms, MD        Patient Profile:   [name] is a 71 y.o. female with a hx of bicuspid AV/AS with ascending aortic aneurysm s/p resection/grafting of ascending aortic aneurysm and placement of St. Jude mechanical AV conduit/[place] 2009, atrial fibrillation/flutter, bradycardia s/p Bos Sci PPM implantation 12/2019, multiple falls with prior craniotomy for subdural hematoma/subarachnoid hemorrhage, prior chronic infarcts on MRI 2021, bipolar disorder, chronic diastolic CHF (prior LVEF 75-44% in 2021, improved to 50-55% in 03/2020), anemia, depression, debilitation (wheelchair bound), DM, HLD, HTN, mild carotid artery disease 2021, back pain, obesity who is being seen 06/27/2021 for the evaluation of CHF at the request of Benedetto Goad PA-C.  History of Present Illness:   [name] follows with Dr. Tamala Julian and Dr. Lovena Le for above history. Preop cath 2009 showed widely patent left anterior descending, circumflex, and right coronary with possible ostial/proximal aortic with proximal to ostial left main narrowing, although no significant catheter damping was noted.  The concern about the left main was that there was no reflux of contrast into the aortic root.  Cor CT showed patent LM.  In addition to cardiac history above, she has a history of prior complex medical admissions for issues with acute encephalopathy, UTI, polypharmacy, anemia, abnormal LFTs, AKI, and frequent falls. She was hospitalized in 06/2020 in [facility] with fall and large left subdural hematoma, subarachnoid hemorrhage and some events of cerebral edema requiring emergent intubation, emergent reversal of her anticoagulant and emergent neurosurgical intervention with craniotomy and evacuation of the bleed. Patient was  in the intensive care unit for the majority of her hospital stay and eventually discharged to [facility]. She was readmitted in our system 07/2020 with another fall and CT showing trace acute subarachnoid hemorrhage of the right temporal lobe in addition to minimal acute on chronic right parietal subdural hematoma. This was followed conservatively. She has been subsequently mostly wheelchair bound and has had terrible difficulty with ambulation and balance. She had been continued on Coumadin following these events. Regarding her atrial fibrillation, she required prior DCCV and amiodarone. However, her amiodarone eventually had to be stopped due to interaction with QT prolonging meds for bipolar disorder. Last EKG 09/2020 was felt to reflect NSR with atrial pacing. Dr. Lovena Le recommended watchful waiting at that time with consideration of dofetilide in the future perhaps with modification of her neuro meds. Last echo 03/2020 EF 50-55%, suboptimal wnidows, moderate MAC, St Jude mechanical AVR, leaflets not well visualized, echo findings c/w normal structure/function, grossly normal appearance of proximal segment of prior aortic root/ascending aorta repair/replacement, not well visualized beyond prox ascending aorta. She was seen by Dr. Johnsie Cancel on 06/24/21 for DOD add on due to worsening dyspnea and edema. She was supposed to have been on Lasix 80m daily by our prior records but [facility]. HR was up, carvedilol increased and Lasix re-added with plan for labs and close office f/u.   She presented to the hospital today with worsening SOB - EMS was called. She was 92% on RA initially with afib in the 130s and placed on [place] O2 but ultimately required BiPAP in the ED. EKG shows atrial fibrillation 104bpm, old anterior infarct, QTC 4563m nonspecific STT changes.  BNP 854, troponin 54, Hgb 10.6 (similar to prior 10-11 baseline), Cr 1.11 (near baseline). CXR showed  increase in mild-to-moderate interstitial thickening likely mild to moderate interstitial pulmonary edema. She was given 37m IV Lasix. Covid/flu pending. Cardiology asked to consult for CHF and afib.  Past Medical History:  Diagnosis Date   ANEMIA 05/28/2010   AORTIC VALVE REPLACEMENT, HX OF 03/11/2010   Mechanical prosthesis   Ascending aortic aneurysm    Atrial fibrillation (HCC)    [place]    BIPOLAR AFFECTIVE DISORDER 03/11/2010   Bradycardia    Chronic diastolic CHF (congestive heart failure) (HCC)    Depression    Diabetes ([place]    Diverticulitis 12/18/2010   Falls    FIBROIDS, UTERUS 03/11/2010   Gout 09/04/2016   Grave's disease 12/2010   Hyperlipidemia associated with type 2 diabetes mellitus [place] 06/05/2016   Hypertension    Low back pain 11/02/2017   Migraine 06/05/2016   Mixed hyperlipidemia 03/11/2010   Obesity    Pacemaker    Prolonged QT interval    SAH (subarachnoid hemorrhage) ([place]    Skin cancer    Subdural hematoma    Syncope 08/22/2019   UTI (lower urinary tract infection) 08/14/2011    Past Surgical History:  Procedure Laterality Date   ABDOMINAL HYSTERECTOMY  ?1998   sb/l spo, total for fibroids/heavy bleeding   ANKLE SURGERY Left 01/2018   hardware removal   AORTIC VALVE REPLACEMENT  2011   Mechanical prosthesis, St. Jude   APPENDECTOMY     Required revision for EColi infection, required recurrent packing   bowel obstruction     Requiring adhesions to be removed   CARDIOVERSION N/A 05/09/2020   Procedure: CARDIOVERSION;  Surgeon: RSkeet Latch MD;  [facility]  Service: Cardiovascular;  Laterality: N/A;   CHOLECYSTECTOMY     COLONOSCOPY WITH PROPOFOL N/A 11/01/2015   Procedure: COLONOSCOPY WITH PROPOFOL;  Surgeon: DMilus Banister MD;  Location: [facility];  Service: Endoscopy;  Laterality: N/A;   CYSTOCELE REPAIR N/A 10/16/2015   Procedure: ANTERIOR REPAIR (CYSTOCELE);  Surgeon: TDonnamae Jude MD;  [facility];  Service: Gynecology;  Laterality: N/A;   HERNIA REPAIR  08-16-10   LEFT HEART CATHETERIZATION WITH CORONARY ANGIOGRAM N/A 12/05/2011   Procedure: LEFT HEART CATHETERIZATION WITH CORONARY ANGIOGRAM;  Surgeon: HSinclair Grooms MD;  [facility];  Service: Cardiovascular;  Laterality: N/A;   PACEMAKER IMPLANT N/A 01/09/2020   Procedure: PACEMAKER IMPLANT;  Surgeon: TEvans Lance MD;  [facility];  Service: Cardiovascular;  Laterality: N/A;   TOTAL KNEE ARTHROPLASTY Right 02/24/2013   Procedure: RIGHT TOTAL KNEE ARTHROPLASTY;  Surgeon: JJohnn Hai MD;  [facility];  Service: Orthopedics;  Laterality: Right;   TUBAL LIGATION     varicose vein surgery b/l legs       Home Medications:  Prior to Admission medications   Medication Sig Start Date End Date Taking? Authorizing Provider  acetaminophen (TYLENOL) 325 MG tablet Take 325 mg by mouth every 4 (four) hours as needed for mild pain.   Yes [provider]  albuterol (VENTOLIN HFA) 108 (90 Base) MCG/ACT inhaler Inhale 2 puffs into the lungs at bedtime. 06/05/21  Yes [provider]  carvedilol (COREG) 6.25 MG tablet Take 1 tablet (6.25 mg total) by mouth 2 (two) times daily. Patient taking differently: Take 6.25 mg by mouth 2 (two) times daily. Do NOT give if HR <65 bpm. Check HR  prior to admin 06/24/21  Yes Josue Hector, MD  fenofibrate 160 MG tablet Take 1 tablet (160 mg total) by mouth daily. 07/25/20  Yes [place] Eric J, DO  fexofenadine (ALLEGRA) 60 MG tablet Take 60 mg by mouth daily.   Yes [provider]  furosemide (LASIX) 40 MG tablet Take 1 tablet (40 mg total) by mouth daily. 06/24/21  Yes Josue Hector, MD  lamoTRIgine (LAMICTAL) 100 MG tablet Take 100 mg by mouth daily.   Yes [provider]  lamoTRIgine (LAMICTAL) 200 MG tablet Take 200 mg by mouth at bedtime. 12/13/20  Yes [provider]  levothyroxine (SYNTHROID) 25 MCG tablet TAKE 1 TABLET  BY MOUTH DAILY BEFORE BREAKFAST. Patient taking differently: Take 25 mcg by mouth daily before breakfast. 08/22/20  Yes Mosie Lukes, MD  Melatonin 10 MG TABS Take 10 mg by mouth at bedtime.   Yes [provider]  Miconazole POWD Apply 1 application topically 4 (four) times daily as needed (vaginal itching and odor). Patient may keep at bedside 06/21/21 07/19/21 Yes [provider]  pantoprazole (PROTONIX) 40 MG tablet Take 1 tablet (40 mg total) by mouth daily. 07/25/20  Yes [place] Eric J, DO  rosuvastatin (CRESTOR) 40 MG tablet Take 40 mg by mouth daily.   Yes [provider]  traZODone (DESYREL) 50 MG tablet Take 100 mg by mouth at bedtime. 10/04/20  Yes [provider]  venlafaxine XR (EFFEXOR XR) 75 MG 24 hr capsule Take 1 capsule (75 mg total) by mouth daily with breakfast. 10/11/19 06/27/21 Yes Thayer Headings, PMHNP  warfarin (COUMADIN) 1 MG tablet Take 1 tablet by mouth daily. Give with 2.5 mg tab. (Total 3.5 mg daily) 10/01/20  Yes [provider]  warfarin (COUMADIN) 2.5 MG tablet Take 2.5 mg by mouth daily. Give with 1 mg tab = 3.5 mg total   Yes [provider]  fosfomycin (MONUROL) 3 g PACK Take 3 g by mouth daily. Patient not taking: Reported on 06/27/2021 05/30/21   [provider]  OXcarbazepine (TRILEPTAL) 150 MG tablet Take 1 tablet (150 mg total) by mouth 2 (two) times daily. Patient not taking: Reported on 06/27/2021 07/25/20   [place] Eric J, DO  warfarin (COUMADIN) 4 MG tablet Take 4 mg by mouth daily. Use as directed Patient not taking: Reported on 06/27/2021 10/05/20   [provider]  methimazole (TAPAZOLE) 5 MG tablet Take 1 tablet (5 mg total) by mouth 3 (three) times daily. 06/24/11 08/31/19  Renato Shin, MD    Inpatient Medications: Scheduled Meds:  carvedilol  3.125 mg Oral BID WC   docusate sodium  100 mg Oral BID   [START ON 06/28/2021] fenofibrate  160 mg Oral Daily   furosemide  20 mg Intravenous Once    lamoTRIgine  100 mg Oral Daily   lamoTRIgine  200 mg Oral QHS   [START ON 06/28/2021] levothyroxine  25 mcg Oral QAC breakfast   pantoprazole  40 mg Oral Daily   potassium chloride  40 mEq Oral Once   rosuvastatin  40 mg Oral Daily   sodium chloride flush  3 mL Intravenous Q12H   cyanocobalamin  1,000 mcg Oral Daily   Vitamin D3  1 tablet Oral Daily   Continuous Infusions:  magnesium sulfate bolus IVPB     PRN Meds: acetaminophen **OR** acetaminophen, albuterol, bisacodyl, hydrALAZINE, ondansetron **OR** ondansetron (ZOFRAN) IV, SUMAtriptan  Allergies:    Allergies  Allergen Reactions   Mucinex [Guaifenesin Er] Other (See  Comments)    Severe headaches    Keflex [Cephalexin] Other (See Comments)    Headaches and dizziness   Keflex [Cephalexin] Other (See Comments)    Headaches and/or dizziness- "allergic," per MAR   Mucinex [Guaifenesin Er] Other (See Comments)    Severe headaches- "allergic," per MAR   Sulfa Antibiotics Other (See Comments)    headaches   Sulfa Antibiotics Other (See Comments)    Headaches- "allergic," per MAR    Sulfonamide Derivatives Other (See Comments)    Headaches     Social History:   Social History   Socioeconomic History   Marital status: Married    Spouse name: Not on file   Number of children: Not on file   Years of education: Not on file   Highest education level: Not on file  Occupational History   Not on file  Tobacco Use   Smoking status: Former    Types: Cigarettes    Quit date: 05/26/1990    Years since quitting: 31.1   Smokeless tobacco: Never  Vaping Use   Vaping Use: Never used  Substance and Sexual Activity   Alcohol use: Yes    Alcohol/week: 0.0 standard drinks    Comment: rare   Drug use: No   Sexual activity: Not on file  Other Topics Concern   Not on file  Social History Narrative   ** Merged History Encounter **       Social Determinants of Health   Financial Resource Strain: Not on file  Food Insecurity:  Not on file  Transportation Needs: Not on file  Physical Activity: Not on file  Stress: Not on file  Social Connections: Not on file  Intimate Partner Violence: Not on file    Family History:    Family History  Problem Relation Age of Onset   Scoliosis Mother    Arthritis Mother        Rheumatoid   Aneurysm Mother        heart   Alcohol abuse Father    Depression Sister    Depression Brother    Alcohol abuse Brother    Cancer Maternal Grandmother        colon   Diabetes Maternal Grandfather    Heart disease Maternal Grandfather    Alcohol abuse Maternal Grandfather    Depression Paternal Grandmother    Diabetes Paternal Grandmother    Depression Paternal Grandfather    Diabetes Paternal Grandfather    Depression Sister    Alcohol abuse Brother    Depression Brother    Heart disease Brother      ROS:  Please see the history of present illness.  All other ROS reviewed and negative.     Physical Exam/Data:   Vitals:   06/27/21 1700 06/27/21 1730 06/27/21 1800 06/27/21 1839  BP: 122/82 134/89 124/82 139/79  Pulse: (!) 102 (!) 109 (!) 101 (!) 105  Resp: 17 (!) _0 Temp:    97.8 F (36.6 C)  TempSrc:    Oral  SpO2: 99% 98% 100% 95%  Weight:      Height:        Intake/Output Summary (Last 24 hours) at 06/27/2021 1850 Last data filed at 06/27/2021 1801 Gross per 24 hour  Intake --  Output 1750 ml  Net -1750 ml   Last 3 Weights 06/27/2021 06/24/2021 01/02/2021  Weight (lbs) 213 lb 210 lb 220 lb 9.6 oz  Weight (kg) 96.616 kg 95.255 kg 100.064 kg  Body mass index is 32.39 kg/m.  Exam per MD   EKG:  The EKG was personally reviewed and demonstrates:  atrial fibrillation 104bpm, old anterior infarct, QTC 461m, nonspecific STT changes  Telemetry:  Telemetry was personally reviewed and demonstrates:  atrial fib, rare PVC  Relevant CV Studies: Echo 03/2020    1. Left ventricular ejection fraction, by estimation, is 50 to 55%. The  left ventricle has low  normal function. Left ventricular endocardial  border not optimally defined to evaluate regional wall motion. Left  ventricular diastolic parameters are  indeterminate.   2. Right ventricular systolic function is mildly reduced. The right  ventricular size is normal. Tricuspid regurgitation signal is inadequate  for assessing PA pressure.   3. The mitral valve is degenerative. No evidence of mitral valve  regurgitation. No evidence of mitral stenosis. Moderate mitral annular  calcification.   4. The aortic valve has been repaired/replaced. Aortic valve  regurgitation is trivial. No aortic stenosis is present. Mechanical valve  leaflets not well visualized. There is a 25 mm St. Jude mechanical valved  conduit valve present in the aortic  position. Procedure Date: 05/22/2008. Echo findings are consistent with  normal structure and function of the aortic valve prosthesis. Aortic valve  mean gradient measures 7.0 mmHg.   5. Aortic root/ascending aorta has been repaired/replaced. Grossly normal  appearance in proximal segment, not well visualized beyond proximal  ascending aorta.   6. The inferior vena cava is normal in size with greater than 50%  respiratory variability, suggesting right atrial pressure of 3 mmHg.   Conclusion(s)/Recommendation(s): No intracardiac source of embolism  detected on this transthoracic study. A transesophageal echocardiogram is  recommended to exclude cardiac source of embolism if clinically indicated.   Laboratory Data:  High Sensitivity Troponin:   Recent Labs  Lab 06/27/21 1443  TROPONINIHS 54*     Chemistry Recent Labs  Lab 06/24/21 1139 06/27/21 1443 06/27/21 1503 06/27/21 1504  NA 141 138 139 140  K 3.9 3.8 3.9 3.9  CL 103 103  --  104  CO2 25 24  --   --   GLUCOSE 116* 134*  --  130*  BUN 13 21  --  21  CREATININE 1.14* 1.11*  --  1.10*  CALCIUM 9.3 8.9  --   --   GFRNONAA  --  53*  --   --   ANIONGAP  --  11  --   --     No results  for input(s): PROT, ALBUMIN, AST, ALT, ALKPHOS, BILITOT in the last 168 hours. Lipids No results for input(s): CHOL, TRIG, HDL, LABVLDL, LDLCALC, CHOLHDL in the last 168 hours.  Hematology Recent Labs  Lab 06/27/21 1443 06/27/21 1503 06/27/21 1504  WBC 8.2  --   --   RBC 3.52*  --   --   HGB 10.6* 11.6* 11.6*  HCT 33.1* 34.0* 34.0*  MCV 94.0  --   --   MCH 30.1  --   --   MCHC 32.0  --   --   RDW 15.7*  --   --   PLT 282  --   --    Thyroid No results for input(s): TSH, FREET4 in the last 168 hours.  BNP Recent Labs  Lab 06/24/21 1139 06/27/21 1446  BNP  --  854.4*  PROBNP 6,337*  --     DDimer No results for input(s): DDIMER in the last 168 hours.   [facility]Studies[facility]  [facility]  Result Date: 06/27/2021 CLINICAL DATA:  Shortness of breath. Progressive worsening over 3 weeks. EXAM: PORTABLE CHEST 1 VIEW COMPARISON:  AP chest views 07/23/2020 FINDINGS: Left chest wall cardiac pacer is again seen with leads overlying the right atrium and right ventricle. Status post median sternotomy. Cardiac silhouette is again moderately enlarged. Cardiac valve prosthesis is again noted overlying the mid heart. Mediastinal contours are grossly within normal limits. Mild interval increase in mild-to-moderate interstitial thickening bilaterally. Possible tiny bilateral pleural effusions. No pneumothorax. No acute skeletal abnormality. Right axillary surgical clips are again noted. IMPRESSION: Interval increase in mild-to-moderate interstitial thickening likely mild to moderate interstitial pulmonary edema. Stable cardiomegaly. Electronically Signed   By: Yvonne Kendall M.D.   On: 06/27/2021 14:54     Assessment and Plan:   Patient's note prepped in anticipation of staffing. MD Dr. Johnsie Cancel saw/evaluated patient. See below for formal assessment/recs.  Risk Assessment/Risk Scores:        New York Heart Association (NYHA) Functional Class NYHA Class IV  CHA2DS2-VASc Score = 8   This  indicates a 10.8% annual risk of stroke. The patient's score is based upon: CHF History: 1 HTN History: 1 Diabetes History: 1 Stroke History: 2 Vascular Disease History: 1 (mild carotid disease) Age Score: 1 Gender Score: 1      For questions or updates, please contact [facility] Please consult [email or web address] for contact info under    Signed, Charlie Pitter, PA-C  06/27/2021 6:51 PM

## 2021-06-27 NOTE — ED Provider Notes (Signed)
[facility] EMERGENCY DEPARTMENT Provider Note   CSN: [id number] Arrival date & time: 06/27/21  1409     History  Chief Complaint  Patient presents with   Shortness of Breath         [name] is a 71 y.o. female in acute distress with a history of recurrent AF, DM type II, chronic diastolic heart failure, history of AV valve replacement for increasing shortness of breath over the last week.  Speaks in 1-3 word sentences.  Was seen by her cardiologist twice in this time, in which it was discovered she had not been receiving her Lasix while at SNF due to it being mistakenly removed from her medication list.  She was restarted on 40 mg of Lasix daily around 06/22/2021.  SOB worsened and she was brought to the ED.  Denies chest pain, recent illness, fever, syncope, lightheadedness.  Not on O2 at home.  Endorses chronic sometimes productive cough of clear sputum with occasional pale pink sputum.  On chronic Coumadin due to AV valve replacement.  No history of COPD or tobacco use.    The history is provided by the patient and medical records.  Shortness of Breath Associated symptoms: cough and diaphoresis   Associated symptoms: no abdominal pain, no chest pain, no ear pain, no fever, no headaches, no rash, no sore throat and no vomiting       Home Medications Prior to Admission medications   Medication Sig Start Date End Date Taking? Authorizing Provider  acetaminophen (TYLENOL) 325 MG tablet Take 325 mg by mouth every 4 (four) hours as needed for mild pain.    [provider]  amoxicillin (AMOXIL) 500 MG capsule Take 1 capsule (500 mg total) by mouth 3 (three) times daily. 06/22/20   Mosie Lukes, MD  Azelastine HCl 137 MCG/SPRAY SOLN Place into both nostrils. 11/15/20   [provider]  butalbital-acetaminophen-caffeine (FIORICET) 50-325-40 MG tablet Take 1 tablet by mouth as needed for migraine. 03/08/20   [provider]  carvedilol  (COREG) 6.25 MG tablet Take 1 tablet (6.25 mg total) by mouth 2 (two) times daily. 06/24/21   Josue Hector, MD  Cholecalciferol (VITAMIN D3) 1.25 MG (50000 UT) TABS Take 1 tablet by mouth daily.    [provider]  docusate sodium (COLACE) 100 MG capsule Take 1 capsule (100 mg total) by mouth 2 (two) times daily. 07/25/20   [place] Eric J, DO  fenofibrate 160 MG tablet Take 1 tablet (160 mg total) by mouth daily. 07/25/20   [place] Donnamarie Poag, DO  furosemide (LASIX) 40 MG tablet Take 1 tablet (40 mg total) by mouth daily. 06/24/21   Josue Hector, MD  glucose blood [facility] VERIO) test strip Check blood sugars once daily Dx: E11.9 05/03/20   Mosie Lukes, MD  insulin aspart (NOVOLOG) 100 UNIT/ML injection Inject 0-12 Units into the skin See admin instructions. Inject 0-12 units into the skin three times a day before meals, per sliding scale: BGL <70 = CALL MD; 70-200 = give nothing; 201-250 = 2 units; 251-300 = 4 units; 301-350 = 6 units; 351-400 = 8 units; 401-450 = 10 units; 451-600 = 12 units; >450 = 12 units and re-check in two hours- call MD if still elevated    [provider]  lamoTRIgine (LAMICTAL) 200 MG tablet Take 200 mg by mouth daily. 12/13/20   [provider]  Lancets (ONETOUCH ULTRASOFT) lancets Use as instructed to test blood sugars  once daily Dx. E11.65 05/03/20   Mosie Lukes, MD  levothyroxine (SYNTHROID) 25 MCG tablet TAKE 1 TABLET BY MOUTH DAILY BEFORE BREAKFAST. 08/22/20   Mosie Lukes, MD  nystatin cream (MYCOSTATIN) Apply 1 application topically 2 (two) times daily. 05/03/20   Mosie Lukes, MD  OXcarbazepine (TRILEPTAL) 150 MG tablet Take 1 tablet (150 mg total) by mouth 2 (two) times daily. 07/25/20   [place] Eric J, DO  pantoprazole (PROTONIX) 40 MG tablet Take 1 tablet (40 mg total) by mouth daily. 07/25/20   [place] Donnamarie Poag, DO  polyvinyl alcohol (LIQUIFILM TEARS) 1.4 % ophthalmic solution Place 1 drop into both eyes every 4 (four) hours as  needed for dry eyes.    [provider]  QUEtiapine (SEROQUEL XR) 200 MG 24 hr tablet Take 2 tablets (400 mg total) by mouth every evening. 05/16/20   Thayer Headings, PMHNP  QUEtiapine (SEROQUEL) 25 MG tablet Take 25 mg by mouth at bedtime.    [provider]  rosuvastatin (CRESTOR) 40 MG tablet Take 40 mg by mouth daily.    [provider]  saccharomyces boulardii (FLORASTOR) 250 MG capsule Take 1 capsule (250 mg total) by mouth daily. 07/25/20   [place] Eric J, DO  SUMAtriptan (IMITREX) 50 MG tablet TAKE ONE TABLET EVERY 2 HOURS AS NEEDED FOR MIGRAINE. MAY REPEAT IN 2 HOURS IF HEADACHE PERSISTS OR RECURS. (MAX OF 2 TABLETS IN 24 HOURS) 12/01/17   Mosie Lukes, MD  SUMAtriptan (IMITREX) 50 MG tablet Take 1 tablet (50 mg total) by mouth every 2 (two) hours as needed for migraine. May repeat in 2 hours if headache persists or recurs. 07/25/20   [place] Donnamarie Poag, DO  traZODone (DESYREL) 50 MG tablet Take 100 mg by mouth at bedtime. 10/04/20   [provider]  venlafaxine XR (EFFEXOR XR) 75 MG 24 hr capsule Take 1 capsule (75 mg total) by mouth daily with breakfast. 10/11/19 04/29/20  Thayer Headings, PMHNP  vitamin B-12 1000 MCG tablet Take 1 tablet (1,000 mcg total) by mouth daily. 04/01/20   Geradine Girt, DO  warfarin (COUMADIN) 1 MG tablet Take 1 tablet by mouth daily. Use as directed 10/01/20   [provider]  warfarin (COUMADIN) 3 MG tablet Take 3 mg by mouth daily.    [provider]  warfarin (COUMADIN) 4 MG tablet Take 4 mg by mouth daily. Use as directed 10/05/20   [provider]  warfarin (COUMADIN) 5 MG tablet Take as directed by [facility]. 09/15/19   Belva Crome, MD  methimazole (TAPAZOLE) 5 MG tablet Take 1 tablet (5 mg total) by mouth 3 (three) times daily. 06/24/11 08/31/19  Renato Shin, MD      Allergies    Mucinex [guaifenesin er], Keflex [cephalexin], Keflex [cephalexin], Mucinex [guaifenesin er], Sulfa antibiotics,  Sulfa antibiotics, and Sulfonamide derivatives    Review of Systems   Review of Systems  Constitutional:  Positive for activity change, diaphoresis and fatigue. Negative for chills and fever.  HENT:  Negative for congestion, ear pain, rhinorrhea and sore throat.   Eyes:  Negative for pain and visual disturbance.  Respiratory:  Positive for cough, chest tightness and shortness of breath. Negative for stridor.   Cardiovascular:  Positive for palpitations. Negative for chest pain and leg swelling.  Gastrointestinal:  Negative for abdominal pain, constipation, diarrhea, nausea and vomiting.  Genitourinary:  Negative for dysuria and hematuria.  Musculoskeletal:  Negative for arthralgias and back pain.  Skin:  Negative for color change and rash.  Neurological:  Negative for dizziness, seizures, syncope, light-headedness and headaches.  All other systems reviewed and are negative.  Physical Exam Updated Vital Signs BP (!) 133/111    Pulse (!) 101    Temp 98 F (36.7 C) (Oral)    Resp 20    Ht 5\' 8"  (1.727 m)    Wt 96.6 kg    SpO2 100%    BMI 32.39 kg/m  Physical Exam Vitals and nursing note reviewed.  Constitutional:      General: She is in acute distress.     Appearance: She is well-developed.  HENT:     Head: Normocephalic and atraumatic.  Eyes:     Conjunctiva/sclera: Conjunctivae normal.  Cardiovascular:     Rate and Rhythm: Tachycardia present. Rhythm irregularly irregular.     Chest Wall: PMI is displaced.     Pulses:          Radial pulses are 3+ on the right side and 3+ on the left side.     Heart sounds: Murmur heard.     Comments: Observed HR vary between 80-140 Pulmonary:     Effort: Tachypnea, accessory muscle usage and respiratory distress present.     Breath sounds: Rales present.  Chest:     Chest wall: No tenderness.  Abdominal:     Palpations: Abdomen is soft.     Tenderness: There is no abdominal tenderness.  Musculoskeletal:        General: No swelling.      Cervical back: Neck supple.     Right lower leg: No edema.     Left lower leg: No edema.  Skin:    General: Skin is warm and dry.     Capillary Refill: Capillary refill takes less than 2 seconds.  Neurological:     Mental Status: She is alert and oriented to person, place, and time.  Psychiatric:        Mood and Affect: Mood normal.    ED Results / Procedures / Treatments   Labs (all labs ordered are listed, but only abnormal results are displayed) Labs Reviewed  CBC WITH DIFFERENTIAL/PLATELET - Abnormal; Notable for the following components:      Result Value   RBC 3.52 (*)    Hemoglobin 10.6 (*)    HCT 33.1 (*)    RDW 15.7 (*)    All other components within normal limits  BASIC METABOLIC PANEL - Abnormal; Notable for the following components:   Glucose, Bld 134 (*)    Creatinine, Ser 1.11 (*)    GFR, Estimated 53 (*)    All other components within normal limits  BRAIN NATRIURETIC PEPTIDE - Abnormal; Notable for the following components:   B Natriuretic Peptide 854.4 (*)    All other components within normal limits  I-STAT CHEM 8, ED - Abnormal; Notable for the following components:   Creatinine, Ser 1.10 (*)    Glucose, Bld 130 (*)    Calcium, Ion 1.08 (*)    Hemoglobin 11.6 (*)    HCT 34.0 (*)    All other components within normal limits  I-STAT VENOUS BLOOD GAS, ED - Abnormal; Notable for the following components:   pCO2, Ven 40.8 (*)    pO2, Ven 141.0 (*)    Calcium, Ion 1.11 (*)    HCT 34.0 (*)    Hemoglobin 11.6 (*)    All other components within normal limits  TROPONIN I (HIGH SENSITIVITY) - Abnormal; Notable  for the following components:   Troponin I (High Sensitivity) 54 (*)    All other components within normal limits  RESP PANEL BY RT-PCR (FLU A&B, COVID) [id number]  PROTIME-INR  LACTIC ACID, PLASMA  LACTIC ACID, PLASMA  C-REACTIVE PROTEIN  TROPONIN I (HIGH SENSITIVITY)    EKG EKG Interpretation  Date/Time:  Thursday June 27 2021 14:10:03  EST Ventricular Rate:  104 PR Interval:    QRS Duration: 111 QT Interval:  343 QTC Calculation: 452 R Axis:   65 Text Interpretation: Atrial fibrillation Anterior infarct, old Borderline repolarization abnormality Confirmed by [facility] ([id number]) on 06/27/2021 3:00:26 PM  Radiology DG Chest Port 1 View  Result Date: 06/27/2021 CLINICAL DATA:  Shortness of breath. Progressive worsening over 3 weeks. EXAM: PORTABLE CHEST 1 VIEW COMPARISON:  AP chest views 07/23/2020 FINDINGS: Left chest wall cardiac pacer is again seen with leads overlying the right atrium and right ventricle. Status post median sternotomy. Cardiac silhouette is again moderately enlarged. Cardiac valve prosthesis is again noted overlying the mid heart. Mediastinal contours are grossly within normal limits. Mild interval increase in mild-to-moderate interstitial thickening bilaterally. Possible tiny bilateral pleural effusions. No pneumothorax. No acute skeletal abnormality. Right axillary surgical clips are again noted. IMPRESSION: Interval increase in mild-to-moderate interstitial thickening likely mild to moderate interstitial pulmonary edema. Stable cardiomegaly. Electronically Signed   By: Yvonne Kendall M.D.   On: 06/27/2021 14:54    Procedures .Critical Care Performed by: Prince Rome, PA-C Authorized by: Prince Rome, PA-C   Critical care provider statement:    Critical care time (minutes):  45   Critical care was necessary to treat or prevent imminent or life-threatening deterioration of the following conditions:  Respiratory failure and cardiac failure   Critical care was time spent personally by me on the following activities:  Blood draw for specimens, discussions with consultants, evaluation of patient's response to treatment, examination of patient, obtaining history from patient or surrogate, pulse oximetry, ordering and review of radiographic studies, ordering and review of laboratory studies, ordering  and performing treatments and interventions, re-evaluation of patient's condition and review of old charts   Care discussed with: admitting provider      Medications Ordered in ED Medications  albuterol (PROVENTIL) (2.5 MG/3ML) 0.083% nebulizer solution 3 mL (has no administration in time range)  vitamin B-12 (CYANOCOBALAMIN) tablet 1,000 mcg (has no administration in time range)  SUMAtriptan (IMITREX) tablet 50 mg (has no administration in time range)  rosuvastatin (CRESTOR) tablet 40 mg (has no administration in time range)  QUEtiapine (SEROQUEL) tablet 25 mg (has no administration in time range)  QUEtiapine (SEROQUEL XR) 24 hr tablet 200 mg (has no administration in time range)  pantoprazole (PROTONIX) EC tablet 40 mg (has no administration in time range)  levothyroxine (SYNTHROID) tablet 25 mcg (has no administration in time range)  fenofibrate tablet 160 mg (has no administration in time range)  docusate sodium (COLACE) capsule 100 mg (has no administration in time range)  Vitamin D3 TABS 1 tablet (has no administration in time range)  ipratropium-albuterol (DUONEB) 0.5-2.5 (3) MG/3ML nebulizer solution 3 mL (3 mLs Nebulization Given 06/27/21 1454)  furosemide (LASIX) injection 60 mg (60 mg Intravenous Given 06/27/21 1545)    ED Course/ Medical Decision Making/ A&P                           Medical Decision Making Amount and/or Complexity of Data Reviewed External Data Reviewed: notes. Labs:  ordered. Decision-making details documented in ED Course. Radiology: ordered and independent interpretation performed. Decision-making details documented in ED Course. ECG/medicine tests: ordered and independent interpretation performed. Decision-making details documented in ED Course.  Risk Prescription drug management.   71 year old female in acute distress presents to the ED for concern of increasing shortness of breath.  This involves an extensive number of treatment options, and is a  complaint that carries with it a high risk of complications and morbidity.  The differential diagnosis includes acute on chronic heart failure, pulmonary edema, pleural effusion, pneumothorax, pulmonary embolism, myocardial infarction.  Comorbidities that complicate the patient evaluation include recurrent atrial fibrillation, chronic diastolic heart failure, diabetes mellitus type 2, AV valve replacement, HLD, long-term anticoagulation  Additional history obtained from internal/external records available via epic  I ordered, and personally interpreted labs.  The pertinent results include: Troponin of 54 which may be related to A. fib and/or increased demand due to fluid overload.  EKG does not indicate ST wave elevation or depression, troponin elevation not likely due to myocardial infarction.  EKG does demonstrate atrial fibrillation.  Creatinine of 1.10 is slightly elevated, but is around this patient's baseline.  BNP value 854.4, which also may be related to increased cardiac stress due to fluid overload.    I ordered imaging studies including chest x-ray.  I independently visualized and interpreted imaging which showed mild to moderate interstitial thickening, supportive of pulmonary edema.  I agree with the radiologist interpretation  The patient was maintained on a cardiac monitor.  I personally viewed and interpreted the cardiac monitored which showed an underlying rhythm of: Atrial fibrillation  I considered ordering CTA chest, but due to labs, patient history, physical exam, and response to Lasix and BiPAP I believe that pulmonary embolism is unlikely at this time.  Interventions I ordered medication including 60 mg of Lasix for fluid overload, and DuoNeb to reduce inflammation and in turn improve her respiratory efforts.    Critical Care:  placed patient on BiPAP to improve respiratory effort in the presence of pulmonary edema and acute respiratory failure.    Reevaluation of the  patient after these medicines showed that the patient noticeably improved.  Patient is looking around the room, moving extremities, and appears less anxious.   Benedetto Goad PA-C requested consultation with the hospitalist and cardiology teams,  and discussed lab and imaging findings as well as pertinent plan.  Cardiology team requested medical admission of the patient and Dr. Candiss Norse of the hospitalist group will be admitting her.  Social Determinants of Health include residence at SNF   Dispostion: I discussed the patient and their case with my attending, Dr. Matilde Sprang and Benedetto Goad PA-C, who agreed with the proposed treatment course.  After consideration of the diagnostic results and the patients response to treatment, I feel that the patent would benefit from admission to better manage her pulmonary edema, fluid overload, and acute respiratory failure.  Discussed course of treatment thoroughly with the patient and her daughter, whom demonstrated understanding.  They are in agreement and have no further questions.        Final Clinical Impression(s) / ED Diagnoses Final diagnoses:  SOB (shortness of breath)  Atrial fibrillation, unspecified type [facility])    Rx / DC Orders ED Discharge Orders     None         Candace Cruise 93/79/02 1657    Teressa Lower, MD 06/27/21 1728

## 2021-06-28 ENCOUNTER — Inpatient Hospital Stay (HOSPITAL_COMMUNITY): Payer: HMO

## 2021-06-28 DIAGNOSIS — J9601 Acute respiratory failure with hypoxia: Secondary | ICD-10-CM | POA: Diagnosis not present

## 2021-06-28 DIAGNOSIS — I5033 Acute on chronic diastolic (congestive) heart failure: Secondary | ICD-10-CM | POA: Diagnosis not present

## 2021-06-28 LAB — ECHOCARDIOGRAM COMPLETE
AR max vel: 2.39 cm2
AV Area VTI: 2.65 cm2
AV Area mean vel: 2.19 cm2
AV Mean grad: 6 mmHg
AV Peak grad: 11.2 mmHg
Ao pk vel: 1.67 m/s
Area-P 1/2: 3.92 cm2
Height: 68 in
MV M vel: 5.07 m/s
MV Peak grad: 102.8 mmHg
MV VTI: 2.23 cm2
Radius: 0.3 cm
S' Lateral: 5.1 cm
Weight: 3312 oz

## 2021-06-28 LAB — CBC WITH DIFFERENTIAL/PLATELET
Abs Immature Granulocytes: 0.02 10*3/uL (ref 0.00–0.07)
Basophils Absolute: 0.1 10*3/uL (ref 0.0–0.1)
Basophils Relative: 1 %
Eosinophils Absolute: 0.2 10*3/uL (ref 0.0–0.5)
Eosinophils Relative: 3 %
HCT: 32.1 % — ABNORMAL LOW (ref 36.0–46.0)
Hemoglobin: 10.3 g/dL — ABNORMAL LOW (ref 12.0–15.0)
Immature Granulocytes: 0 %
Lymphocytes Relative: 24 %
Lymphs Abs: 1.8 10*3/uL (ref 0.7–4.0)
MCH: 29.7 pg (ref 26.0–34.0)
MCHC: 32.1 g/dL (ref 30.0–36.0)
MCV: 92.5 fL (ref 80.0–100.0)
Monocytes Absolute: 0.5 10*3/uL (ref 0.1–1.0)
Monocytes Relative: 6 %
Neutro Abs: 4.8 10*3/uL (ref 1.7–7.7)
Neutrophils Relative %: 66 %
Platelets: 250 10*3/uL (ref 150–400)
RBC: 3.47 MIL/uL — ABNORMAL LOW (ref 3.87–5.11)
RDW: 15.3 % (ref 11.5–15.5)
WBC: 7.4 10*3/uL (ref 4.0–10.5)
nRBC: 0 % (ref 0.0–0.2)

## 2021-06-28 LAB — COMPREHENSIVE METABOLIC PANEL
ALT: 12 U/L (ref 0–44)
AST: 18 U/L (ref 15–41)
Albumin: 3.6 g/dL (ref 3.5–5.0)
Alkaline Phosphatase: 26 U/L — ABNORMAL LOW (ref 38–126)
Anion gap: 9 (ref 5–15)
BUN: 20 mg/dL (ref 8–23)
CO2: 28 mmol/L (ref 22–32)
Calcium: 8.7 mg/dL — ABNORMAL LOW (ref 8.9–10.3)
Chloride: 101 mmol/L (ref 98–111)
Creatinine, Ser: 1.21 mg/dL — ABNORMAL HIGH (ref 0.44–1.00)
GFR, Estimated: 48 mL/min — ABNORMAL LOW (ref >60)
Glucose, Bld: 110 mg/dL — ABNORMAL HIGH (ref 70–99)
Potassium: 3.4 mmol/L — ABNORMAL LOW (ref 3.5–5.1)
Sodium: 138 mmol/L (ref 135–145)
Total Bilirubin: 1 mg/dL (ref 0.3–1.2)
Total Protein: 6.5 g/dL (ref 6.5–8.1)

## 2021-06-28 LAB — BRAIN NATRIURETIC PEPTIDE: B Natriuretic Peptide: 743.6 pg/mL — ABNORMAL HIGH (ref 0.0–100.0)

## 2021-06-28 LAB — MAGNESIUM: Magnesium: 1.8 mg/dL (ref 1.7–2.4)

## 2021-06-28 LAB — PROTIME-INR
INR: 2.8 — ABNORMAL HIGH (ref 0.8–1.2)
Prothrombin Time: 29.3 seconds — ABNORMAL HIGH (ref 11.4–15.2)

## 2021-06-28 MED ORDER — POTASSIUM CHLORIDE 20 MEQ PO PACK
40.0000 meq | PACK | Freq: Once | ORAL | Status: AC
  Administered 2021-06-28: 40 meq via ORAL
  Filled 2021-06-28: qty 2

## 2021-06-28 MED ORDER — POTASSIUM CHLORIDE CRYS ER 20 MEQ PO TBCR
40.0000 meq | EXTENDED_RELEASE_TABLET | Freq: Once | ORAL | Status: AC
  Administered 2021-06-28: 40 meq via ORAL
  Filled 2021-06-28: qty 2

## 2021-06-28 MED ORDER — FUROSEMIDE 10 MG/ML IJ SOLN
40.0000 mg | Freq: Once | INTRAMUSCULAR | Status: AC
  Administered 2021-06-28: 40 mg via INTRAVENOUS
  Filled 2021-06-28: qty 4

## 2021-06-28 MED ORDER — TRAZODONE HCL 50 MG PO TABS
25.0000 mg | ORAL_TABLET | Freq: Once | ORAL | Status: AC
  Administered 2021-06-28: 25 mg via ORAL
  Filled 2021-06-28: qty 1

## 2021-06-28 MED ORDER — WARFARIN SODIUM 2.5 MG PO TABS
3.5000 mg | ORAL_TABLET | Freq: Once | ORAL | Status: AC
  Administered 2021-06-28: 3.5 mg via ORAL
  Filled 2021-06-28: qty 1

## 2021-06-28 MED ORDER — MAGNESIUM SULFATE IN D5W 1-5 GM/100ML-% IV SOLN
1.0000 g | Freq: Once | INTRAVENOUS | Status: AC
  Administered 2021-06-28: 1 g via INTRAVENOUS
  Filled 2021-06-28: qty 100

## 2021-06-28 MED ORDER — POTASSIUM CHLORIDE CRYS ER 20 MEQ PO TBCR
40.0000 meq | EXTENDED_RELEASE_TABLET | Freq: Once | ORAL | Status: AC
Start: 2021-06-28 — End: 2021-06-28
  Administered 2021-06-28: 40 meq via ORAL
  Filled 2021-06-28: qty 2

## 2021-06-28 MED ORDER — CARVEDILOL 6.25 MG PO TABS
6.2500 mg | ORAL_TABLET | Freq: Two times a day (BID) | ORAL | Status: DC
  Administered 2021-06-28 – 2021-07-02 (×8): 6.25 mg via ORAL
  Filled 2021-06-28 (×8): qty 1

## 2021-06-28 MED ORDER — PERFLUTREN LIPID MICROSPHERE
1.0000 mL | INTRAVENOUS | Status: AC | PRN
  Administered 2021-06-28: 2 mL via INTRAVENOUS
  Filled 2021-06-28: qty 10

## 2021-06-28 NOTE — Progress Notes (Signed)
ANTICOAGULATION CONSULT NOTE  Pharmacy Consult for warfarin Indication: atrial fibrillation  Allergies  Allergen Reactions   Mucinex [Guaifenesin Er] Other (See Comments)    Severe headaches    Keflex [Cephalexin] Other (See Comments)    Headaches and dizziness   Keflex [Cephalexin] Other (See Comments)    Headaches and/or dizziness- "allergic," per [facility]   Mucinex [Guaifenesin Er] Other (See Comments)    Severe headaches- "allergic," per [facility]   Sulfa Antibiotics Other (See Comments)    headaches   Sulfa Antibiotics Other (See Comments)    Headaches- "allergic," per [facility]    Sulfonamide Derivatives Other (See Comments)    Headaches     Patient Measurements: Height: 5\' 8"  (172.7 cm) Weight: 93.9 kg (207 lb) IBW/kg (Calculated) : 63.9 Heparin Dosing Weight: 84.9 kg   Vital Signs: Temp: 98.8 F (37.1 C) (02/03 0429) Temp Source: Oral (02/03 0429) BP: 120/78 (02/03 0429) Pulse Rate: 87 (02/03 0429)  Labs: Recent Labs    06/27/21 1443 06/27/21 1503 06/27/21 1504 06/27/21 1835 06/28/21 0337  HGB 10.6* 11.6* 11.6*  --  10.3*  HCT 33.1* 34.0* 34.0*  --  32.1*  PLT 282  --   --   --  250  LABPROT  --   --   --  28.7* 29.3*  INR  --   --   --  2.7* 2.8*  CREATININE 1.11*  --  1.10*  --  1.21*  TROPONINIHS 54*  --   --   --   --      Estimated Creatinine Clearance: 51.8 mL/min (A) (by C-G formula based on SCr of 1.21 mg/dL (H)).   Medical History: Past Medical History:  Diagnosis Date   ANEMIA 05/28/2010   AORTIC VALVE REPLACEMENT, HX OF 03/11/2010   Mechanical prosthesis   Ascending aortic aneurysm    Atrial fibrillation (HCC)    Atrial flutter ([place])    BIPOLAR AFFECTIVE DISORDER 03/11/2010   Bradycardia    Chronic diastolic CHF (congestive heart failure) (HCC)    Depression    Diabetes ([place])    Diverticulitis 12/18/2010   Falls    FIBROIDS, UTERUS 03/11/2010   Gout 09/04/2016   Grave's disease 12/2010   Hyperlipidemia associated with type 2 diabetes  mellitus ([place]) 06/05/2016   Hypertension    Low back pain 11/02/2017   Migraine 06/05/2016   Mixed hyperlipidemia 03/11/2010   Obesity    Pacemaker    Prolonged QT interval    SAH (subarachnoid hemorrhage) (HCC)    Skin cancer    Subdural hematoma    Syncope 08/22/2019   UTI (lower urinary tract infection) 08/14/2011    Medications:  Medications Prior to Admission  Medication Sig Dispense Refill Last Dose   acetaminophen (TYLENOL) 325 MG tablet Take 325 mg by mouth every 4 (four) hours as needed for mild pain.   06/27/2021   albuterol (VENTOLIN HFA) 108 (90 Base) MCG/ACT inhaler Inhale 2 puffs into the lungs at bedtime.   06/26/2021   carvedilol (COREG) 6.25 MG tablet Take 1 tablet (6.25 mg total) by mouth 2 (two) times daily. (Patient taking differently: Take 6.25 mg by mouth 2 (two) times daily. Do NOT give if HR <65 bpm. Check HR prior to admin) 180 tablet 3 06/27/2021 at 0830   fenofibrate 160 MG tablet Take 1 tablet (160 mg total) by mouth daily.   06/27/2021   fexofenadine (ALLEGRA) 60 MG tablet Take 60 mg by mouth daily.   06/27/2021   furosemide (LASIX) 40  MG tablet Take 1 tablet (40 mg total) by mouth daily. 90 tablet 3 06/27/2021   lamoTRIgine (LAMICTAL) 100 MG tablet Take 100 mg by mouth daily.   06/27/2021   lamoTRIgine (LAMICTAL) 200 MG tablet Take 200 mg by mouth at bedtime.   06/26/2021   levothyroxine (SYNTHROID) 25 MCG tablet TAKE 1 TABLET BY MOUTH DAILY BEFORE BREAKFAST. (Patient taking differently: Take 25 mcg by mouth daily before breakfast.) 90 tablet 0 06/27/2021   Melatonin 10 MG TABS Take 10 mg by mouth at bedtime.   06/26/2021   Miconazole POWD Apply 1 application topically 4 (four) times daily as needed (vaginal itching and odor). Patient may keep at bedside   unk   pantoprazole (PROTONIX) 40 MG tablet Take 1 tablet (40 mg total) by mouth daily.   06/27/2021   rosuvastatin (CRESTOR) 40 MG tablet Take 40 mg by mouth daily.   06/26/2021   traZODone (DESYREL) 50 MG tablet Take 100 mg by  mouth at bedtime.   06/26/2021   venlafaxine XR (EFFEXOR XR) 75 MG 24 hr capsule Take 1 capsule (75 mg total) by mouth daily with breakfast. 90 capsule 1 06/27/2021   warfarin (COUMADIN) 1 MG tablet Take 1 tablet by mouth daily. Give with 2.5 mg tab. (Total 3.5 mg daily)   06/25/2021   warfarin (COUMADIN) 2.5 MG tablet Take 2.5 mg by mouth daily. Give with 1 mg tab = 3.5 mg total   06/25/2021   warfarin (COUMADIN) 4 MG tablet Take 4 mg by mouth daily. Use as directed (Patient not taking: Reported on 06/27/2021)   Not Taking    Assessment: 62 YOF with h/o recurrent AF and h/o mechanical AV valve replacement on warfarin at facility. Pharmacy consulted to resume warfarin. Per facility records, patient's last dose of warfarin was on 1/31  INR therapeutic this morning, CBC stable.   Patient's records suggest he was on 4 mg daily but dose was recently reduced to 3.5 mg daily.   Goal of Therapy:  INR 2.5-3.5  Monitor platelets by anticoagulation protocol: Yes   Plan:  -Warfarin 3.5 mg x 1 dose tonight  -Monitor daily PT/INR -Monitor for s/s of bleeding   Arrie Senate, PharmD, BCPS, [facility] Clinical Pharmacist [phone] Please check AMION for all [place] numbers 06/28/2021

## 2021-06-28 NOTE — Progress Notes (Signed)
Pt showing no signs of respiratory distress BIPAP not needed at this time.

## 2021-06-28 NOTE — Progress Notes (Signed)
Heart Failure Nurse Navigator Progress Note   TOC appt changed at daughter's request secondary to transportation concerns to 2/15 at 11:00AM.

## 2021-06-28 NOTE — TOC Initial Note (Addendum)
Transition of Care [facility]) - Initial/Assessment Note    Patient Details  Name: USHA SLAGER MRN: [id number] Date of Birth: 05-Jul-1950  Transition of Care [facility]) CM/SW Contact:    Milas Gain, [place] Phone Number: 06/28/2021, 1:10 PM  Clinical Narrative:                   CSW spoke with Star with [facility] who confirmed patient comes from SNF long term. CSW will continue to follow and assist with patients dc planning needs.  Expected Discharge Plan: Skilled Nursing Facility Barriers to Discharge: Continued Medical Work up   Patient Goals and CMS Choice Patient states their goals for this hospitalization and ongoing recovery are:: SNF CMS Medicare.gov Compare Post Acute Care list provided to:: Patient Choice offered to / list presented to : Patient  Expected Discharge Plan and Services Expected Discharge Plan: [place]       Living arrangements for the past 2 months: [place]                                      Prior Living Arrangements/Services Living arrangements for the past 2 months: [place] Lives with:: Facility Resident (From [place] term) Patient language and need for interpreter reviewed:: Yes        Need for Family Participation in Patient Care: Yes (Comment) Care giver support system in place?: Yes (comment)   Criminal Activity/Legal Involvement Pertinent to Current Situation/Hospitalization: No - Comment as needed  Activities of Daily Living      Permission Sought/Granted Permission sought to share information with : Case Manager, Family Supports, Customer service manager                Emotional Assessment Appearance:: Appears stated age Attitude/Demeanor/Rapport: Gracious Affect (typically observed): Calm Orientation: : Oriented to Self, Oriented to Place, Oriented to  Time, Oriented to Situation (WDL) Alcohol / Substance Use: Not Applicable Psych Involvement: No  (comment)  Admission diagnosis:  SOB (shortness of breath) [R06.02] Acute hypoxemic respiratory failure (HCC) [J96.01] Atrial fibrillation, unspecified type ([place]) [I48.91] Patient Active Problem List   Diagnosis Date Noted   Acute hypoxemic respiratory failure ([place]) 06/27/2021   Subarachnoid hematoma 07/24/2020   Acute cystitis without hematuria 07/24/2020   Fall at home, initial encounter 07/24/2020   Acute on chronic combined systolic and diastolic CHF (congestive heart failure) ([place]) 07/24/2020   Essential hypertension 07/24/2020   GERD without esophagitis 07/24/2020   Atrial fibrillation and flutter ([place]) 07/24/2020   Bipolar disorder ([place]) 07/24/2020   Subdural hematoma 07/24/2020   Hypothyroidism 07/24/2020   Acute renal failure superimposed on stage 3a chronic kidney disease ([place]) 07/24/2020   History of mechanical aortic valve replacement 07/24/2020   Vitamin B 12 deficiency 05/06/2020   Supratherapeutic INR 04/30/2020   Elevated liver enzymes 04/30/2020   Elevated TSH 04/30/2020   Altered mental status 04/29/2020   Tachycardia-bradycardia syndrome ([place]) 04/25/2020   Pacemaker 04/25/2020   Recurrent falls 03/27/2020   Bradycardia 08/23/2019   Prolonged QT interval    Near syncope 08/21/2019   GAD (generalized anxiety disorder) 04/25/2018   Ankle pain 02/04/2018   Nocturia 11/02/2017   Low back pain 11/02/2017   Type 2 diabetes mellitus with diabetic polyneuropathy, without long-term current use of insulin ([place]) 11/02/2017   Hyperlipidemia associated with type 2 diabetes mellitus ([place]) 06/05/2016   Migraine 06/05/2016   Encounter  for therapeutic drug monitoring 09/13/2013   Essential hypertension, benign 04/05/2013   Atrial fibrillation ([place]) 03/18/2013   Long term current use of anticoagulant therapy 03/11/2013   UTI (urinary tract infection) 02/10/2013   Pernicious anemia 02/21/2011   Renal insufficiency 12/30/2010   Hyperthyroidism 12/18/2010   Cellulitis and  abscess of left leg 11/18/2010   Anemia 05/28/2010   Bipolar disorder ([place]) 03/11/2010   DIVERTICULOSIS OF COLON 03/11/2010   Constipation 03/11/2010   Hx of mechanical aortic valve replacement 03/11/2010   PCP:  Mosie Lukes, MD Pharmacy:   [facility] ([place], [place] [address] Phone: [phone] Fax: [phone]  [id number] - [place], [address] [place] [address] Phone: [phone] Fax: [phone]  [place], [place] - [address] [place] [place] [address] [place] [address] Phone: [phone] Fax: [phone]     Social Determinants of Health ([place]) Interventions    Readmission Risk Interventions Readmission Risk Prevention Plan 05/03/2020  Transportation Screening Complete  Home Care Screening Complete  Medication Review (RN CM) Complete  Some recent data might be hidden

## 2021-06-28 NOTE — Progress Notes (Signed)
Heart Failure Nurse Navigator Progress Note   Patient screened for HF TOC appropriateness. Per 2/3 echo -she has a new drop in EF 25-30%.  She will likely return to long term SNF at [facility]  She has been scheduled a H&V TOC appt for 07/08/2021 at 11:00AM.

## 2021-06-28 NOTE — Evaluation (Signed)
Physical Therapy Evaluation/ Discharge Patient Details Name: [name] MRN: [id number] DOB: 08-05-1950 Today's Date: 06/28/2021  History of Present Illness  71 yo admitted 2/2 with respiratory distress and CHF exacerbation. PMHx: dementia, PPM, AVR, Afib, SDH s/p craniotomy, bipolar, CHF, DM, HTN, HLD, gout  Clinical Impression  Pt is a resident of [facility] and reports still participating with PT there with assist for walking and meals.Pt very pleasant and able to maintain SPO2 93-98% on RA throughout session with return to 1.5L end of session per MD request to maintain on O2 this date. Pt reports being at her baseline functional level with assist for gait. Pt does not currently require further acute therapy and will defer back to SNF with recommendation for mobility and up to chair with nursing and mobility specialist.   HR 98-130 with gait       Recommendations for follow up therapy are one component of a multi-disciplinary discharge planning process, led by the attending physician.  Recommendations may be updated based on patient status, additional functional criteria and insurance authorization.  Follow Up Recommendations Other (comment) (return to SNF with further therapy needs to be addressed there)    Assistance Recommended at Discharge Intermittent Supervision/Assistance  Patient can return home with the following  A lot of help with bathing/dressing/bathroom;A little help with walking and/or transfers;Direct supervision/assist for financial management    Equipment Recommendations None recommended by PT  Recommendations for Other Services       Functional Status Assessment Patient has not had a recent decline in their functional status     Precautions / Restrictions Precautions Precautions: Fall      Mobility  Bed Mobility Overal bed mobility: Modified Independent             General bed mobility comments: HOB 45 degrees with pt able to pivot to right side  of bed without assist    Transfers Overall transfer level: Needs assistance   Transfers: Sit to/from Stand, Bed to chair/wheelchair/BSC Sit to Stand: Min guard Stand pivot transfers: Min guard Step pivot transfers: Min guard       General transfer comment: pt able to stand from bed without assist, guarding for lines with stand pivot to bSC and step pivot BSC to recliner with assist for line management and without UE support    Ambulation/Gait Ambulation/Gait assistance: Supervision Gait Distance (Feet): 160 Feet Assistive device: Rolling walker (2 wheels) Gait Pattern/deviations: Step-through pattern, Decreased stride length, Trunk flexed   Gait velocity interpretation: 1.31 - 2.62 ft/sec, indicative of limited community ambulator   General Gait Details: cues for posture, proximity to RW and looking up, direction to room  Stairs            Wheelchair Mobility    Modified Rankin (Stroke Patients Only)       Balance Overall balance assessment: Needs assistance   Sitting balance-Leahy Scale: Good     Standing balance support: Bilateral upper extremity supported, No upper extremity supported Standing balance-Leahy Scale: Fair Standing balance comment: pt able to stand without UE support, RW for gait                             Pertinent Vitals/Pain Pain Assessment Pain Assessment: No/denies pain    Home Living Family/patient expects to be discharged to:: Skilled nursing facility                   Additional Comments:  Pt has been a resident at [facility] for almost a year    Prior Function Prior Level of Function : Needs assist             Mobility Comments: Pt walks with RW with P.T. assist, typically performs pivots to [facility] in room without assist ADLs Comments: pt able to bathe and dress herself in her room     Hand Dominance        Extremity/Trunk Assessment   Upper Extremity Assessment Upper Extremity Assessment:  Generalized weakness    Lower Extremity Assessment Lower Extremity Assessment: Generalized weakness    Cervical / Trunk Assessment Cervical / Trunk Assessment: Kyphotic  Communication   Communication: No difficulties  Cognition Arousal/Alertness: Awake/alert Behavior During Therapy: WFL for tasks assessed/performed Overall Cognitive Status: Impaired/Different from baseline Area of Impairment: Memory                     Memory: Decreased short-term memory                  General Comments      Exercises     Assessment/Plan    PT Assessment All further PT needs can be met in the next venue of care  PT Problem List Decreased mobility;Decreased activity tolerance;Decreased balance       PT Treatment Interventions      PT Goals (Current goals can be found in the Care Plan section)  Acute Rehab PT Goals PT Goal Formulation: All assessment and education complete, DC therapy    Frequency       Co-evaluation               AM-PAC PT "6 Clicks" Mobility  Outcome Measure Help needed turning from your back to your side while in a flat bed without using bedrails?: A Little Help needed moving from lying on your back to sitting on the side of a flat bed without using bedrails?: A Little Help needed moving to and from a bed to a chair (including a wheelchair)?: A Little Help needed standing up from a chair using your arms (e.g., wheelchair or bedside chair)?: A Little Help needed to walk in hospital room?: A Little Help needed climbing 3-5 steps with a railing? : Total 6 Click Score: 16    End of Session Equipment Utilized During Treatment: Gait belt Activity Tolerance: Patient tolerated treatment well Patient left: in chair;with call bell/phone within reach;with chair alarm set Nurse Communication: Mobility status PT Visit Diagnosis: Other abnormalities of gait and mobility (R26.89);Difficulty in walking, not elsewhere classified (R26.2)    Time:  4917-9150 PT Time Calculation (min) (ACUTE ONLY): 37 min   Charges:   PT Evaluation $PT Eval Moderate Complexity: 1 Mod PT Treatments $Gait Training: 8-22 mins        Fionnuala Hemmerich P, PT Acute Rehabilitation Services Pager: [phone] Office: [place] 06/28/2021, 9:52 AM

## 2021-06-28 NOTE — Progress Notes (Signed)
Pt refuses Bi-PAP for the night. Pt was not in any distress and vitals were stable. RT informed pt to let RN know if there are any changes in her breathing or pain during breathing. RT will continue to monitor as needed.

## 2021-06-28 NOTE — Evaluation (Signed)
Occupational Therapy Evaluation Patient Details Name: [name] MRN: [id number] DOB: December 20, 1950 Today's Date: 06/28/2021   History of Present Illness Patient is a 71 yo female who presented to ED on 06/27/21 with shortness of breath and Afib in the 130s and placed on Bipap. Admitted with CHF exacerbation.Patient resides at [facility] and had missed doses of Lasix at [facility].  PMH of DM, migraines, recurrent UTIs, anemia, HTN, atrial fibrillation and mechanical aortic valve replacement on Coumadin, AKI, and frequent falls.   Clinical Impression   Patient is a permanent resident at [facility] and per report is able to complete ADLs without assist. Patient pivots to a wheelchair but can walk with a walker and staff present. Patients meals are typically brought to her and she does not walk to the cafeteria. Patient currently presents with shortness of breath and elevated HR, observed to go to 122 with basic ADL care. Patient mod I for bed mobility, and min guard for stand pivot transfers for [facility]. Patient declining further mobility in this session. Patient is not far from baseline with regard to ADLs, and is more limited by feeling short of breath and fatigue (vital signs stable on RA). OT recommending HHOT at the facility to return to prior level of independence. Therapy will continue to follow acutely in order to address problem list outlined below.      Recommendations for follow up therapy are one component of a multi-disciplinary discharge planning process, led by the attending physician.  Recommendations may be updated based on patient status, additional functional criteria and insurance authorization.   Follow Up Recommendations  Home health OT (Patient resides at SNF)    Assistance Recommended at Discharge Intermittent Supervision/Assistance  Patient can return home with the following Other (comment) (Patient resident at [facility])    Functional Status Assessment  Patient has had a recent  decline in their functional status and demonstrates the ability to make significant improvements in function in a reasonable and predictable amount of time.  Equipment Recommendations  None recommended by OT (Patient has all DME needed)    Recommendations for Other Services       Precautions / Restrictions Precautions Precautions: Fall Restrictions Weight Bearing Restrictions: No      Mobility Bed Mobility Overal bed mobility: Modified Independent             General bed mobility comments:  (patient on BSC upon entry, able to return and adjust in bed with use of bed rails)    Transfers Overall transfer level: Needs assistance   Transfers: Sit to/from Stand, Bed to chair/wheelchair/BSC Sit to Stand: Min guard Stand pivot transfers: Min guard   Step pivot transfers: Min guard     General transfer comment: pt able to stand from bed without assist, guarding for lines with stand pivot to bSC and step pivot BSC to recliner with assist for line management and without UE support      Balance Overall balance assessment: Needs assistance   Sitting balance-Leahy Scale: Good     Standing balance support: Bilateral upper extremity supported, No upper extremity supported Standing balance-Leahy Scale: Fair Standing balance comment: pt able to stand without UE support, RW for gait                           ADL either performed or assessed with clinical judgement   ADL Overall ADL's : Needs assistance/impaired Eating/Feeding: Independent   Grooming: Wash/dry hands;Wash/dry face;Oral  care;Set up;Sitting   Upper Body Bathing: Sitting;Set up   Lower Body Bathing: Set up;Sitting/lateral leans Lower Body Bathing Details (indicate cue type and reason): sits on shower seat in walk in shower at facility Upper Body Dressing : Set up   Lower Body Dressing: Minimal assistance   Toilet Transfer: Min guard;Stand-pivot;BSC/3in1   Toileting- Clothing Manipulation and  Hygiene: Sit to/from stand;Min guard       Functional mobility during ADLs: Minimal assistance;Rolling walker (2 wheels) General ADL Comments: patient presenting with increased WOB and decreased activity tolerance     Vision Baseline Vision/History: 1 Wears glasses Ability to See in Adequate Light: 0 Adequate Patient Visual Report: No change from baseline       Perception     Praxis      Pertinent Vitals/Pain Pain Assessment Pain Assessment: No/denies pain     Hand Dominance Right   Extremity/Trunk Assessment Upper Extremity Assessment Upper Extremity Assessment: Overall WFL for tasks assessed;Generalized weakness   Lower Extremity Assessment Lower Extremity Assessment: Defer to PT evaluation   Cervical / Trunk Assessment Cervical / Trunk Assessment: Kyphotic   Communication Communication Communication: No difficulties   Cognition Arousal/Alertness: Awake/alert Behavior During Therapy: WFL for tasks assessed/performed Overall Cognitive Status: Impaired/Different from baseline Area of Impairment: Memory                     Memory: Decreased short-term memory         General Comments: Patient pleasantly confused, but oriented. Attempted to use phone as call bell but was able to correct without prompting.     General Comments       Exercises     Shoulder Instructions      Home Living Family/patient expects to be discharged to:: Skilled nursing facility                                 Additional Comments: Pt has been a resident at [facility] for almost a year      Prior Functioning/Environment Prior Level of Function : Needs assist             Mobility Comments: Pt walks with RW with P.T. assist, typically performs pivots to [facility] in room without assist ADLs Comments: pt able to bathe and dress herself in her room        OT Problem List: Decreased strength;Decreased range of motion;Decreased activity tolerance;Impaired  balance (sitting and/or standing);Decreased safety awareness;Decreased cognition;Decreased knowledge of precautions      OT Treatment/Interventions: Self-care/ADL training;Therapeutic exercise;Energy conservation;DME and/or AE instruction;Therapeutic activities;Patient/family education    OT Goals(Current goals can be found in the care plan section) Acute Rehab OT Goals Patient Stated Goal: to get better OT Goal Formulation: With patient Time For Goal Achievement: 07/12/21 Potential to Achieve Goals: Good  OT Frequency: Min 2X/week    Co-evaluation              AM-PAC OT "6 Clicks" Daily Activity     Outcome Measure Help from another person eating meals?: None Help from another person taking care of personal grooming?: A Little Help from another person toileting, which includes using toliet, bedpan, or urinal?: A Little Help from another person bathing (including washing, rinsing, drying)?: A Little Help from another person to put on and taking off regular upper body clothing?: A Little Help from another person to put on and taking off regular lower body clothing?: A  Little 6 Click Score: 19   End of Session Nurse Communication: Mobility status  Activity Tolerance: Patient tolerated treatment well Patient left: in bed;with call bell/phone within reach;with bed alarm set  OT Visit Diagnosis: Unsteadiness on feet (R26.81);Other abnormalities of gait and mobility (R26.89);Muscle weakness (generalized) (M62.81);Other symptoms and signs involving cognitive function                Time: 1610-9604 OT Time Calculation (min): 34 min Charges:  OT General Charges $OT Visit: 1 Visit OT Evaluation $OT Eval Moderate Complexity: 1 Mod OT Treatments $Self Care/Home Management : 8-22 mins  Corinne Ports E. Calani Gick, OTR/L Acute Rehabilitation Services [phone] [place] 06/28/2021, 1:46 PM

## 2021-06-28 NOTE — Progress Notes (Addendum)
Progress Note  Patient Name: [name] Date of Encounter: 06/28/2021  [facility] Cardiologist: Belva Crome III, MD   Subjective   No acute overnight events. Patient had great urinary response to Lasix yesterday but she is still reports feeling short of breath especially when she exerts herself. No chest pain. She does states she can feel her heart beating irregularly while she is in atrial fibrillation but no lightheadedness/dizziness.  She is very tired this morning and states she did not sleep well due to baseline insomnia.   Inpatient Medications    Scheduled Meds:  carvedilol  3.125 mg Oral BID WC   cholecalciferol  1,000 Units Oral Daily   docusate sodium  100 mg Oral BID   fenofibrate  160 mg Oral Daily   furosemide  40 mg Intravenous Once   lamoTRIgine  100 mg Oral Daily   lamoTRIgine  200 mg Oral QHS   levothyroxine  25 mcg Oral QAC breakfast   pantoprazole  40 mg Oral Daily   potassium chloride  40 mEq Oral Once   rosuvastatin  40 mg Oral Daily   sodium chloride flush  3 mL Intravenous Q12H   cyanocobalamin  1,000 mcg Oral Daily   warfarin  3.5 mg Oral ONCE-1600   Warfarin - Pharmacist Dosing Inpatient   Does not apply q1600   Continuous Infusions:  magnesium sulfate bolus IVPB 1 g (06/28/21 0631)   PRN Meds: acetaminophen **OR** acetaminophen, albuterol, bisacodyl, hydrALAZINE, ondansetron **OR** ondansetron (ZOFRAN) IV, SUMAtriptan   Vital Signs    Vitals:   06/28/21 0047 06/28/21 0049 06/28/21 0139 06/28/21 0429  BP: 113/81 121/81 119/81 120/78  Pulse: 85 74 (!) 104 87  Resp: (!) 24 (!) 21 (!) 24 (!) 23  Temp:    98.8 F (37.1 C)  TempSrc:    Oral  SpO2: 98% 99% 98% 93%  Weight:    93.9 kg  Height:        Intake/Output Summary (Last 24 hours) at 06/28/2021 0729 Last data filed at 06/28/2021 0631 Gross per 24 hour  Intake 897 ml  Output 3650 ml  Net -2753 ml   Last 3 Weights 06/28/2021 06/27/2021 06/24/2021  Weight (lbs) 207 lb 213 lb 210 lb   Weight (kg) 93.895 kg 96.616 kg 95.255 kg      Telemetry    Atrial fibrillation with PVC and ventricular couplets. Rates mostly in the 80s to 90s but sometimes in the 110s. - Personally Reviewed  ECG    No new ECG tracing today. - Personally Reviewed  Physical Exam   GEN: No acute distress.   Neck: Possible JVD. Cardiac: irregularly irregular rhythm with normal rate. Mechanical click noted but no significant murmurs. No rubs or gallops.  Respiratory: No increased work of breathing. Crackles noted in bilateral bases. GI: Soft, non-distended, and non-tender. MS: No significant lower extremity edema. No deformity. Skin: Warm and dry. Neuro:  No focal deficits. Psych: Normal affect. Responds appropriately.  Labs    High Sensitivity Troponin:   Recent Labs  Lab 06/27/21 1443  TROPONINIHS 54*     Chemistry Recent Labs  Lab 06/24/21 1139 06/24/21 1139 06/27/21 1443 06/27/21 1503 06/27/21 1504 06/28/21 0337  NA 141   < > 138 139 140 138  K 3.9  --  3.8 3.9 3.9 3.4*  CL 103  --  103  --  104 101  CO2 25  --  24  --   --  28  GLUCOSE  116*  --  134*  --  130* 110*  BUN 13  --  21  --  21 20  CREATININE 1.14*  --  1.11*  --  1.10* 1.21*  CALCIUM 9.3  --  8.9  --   --  8.7*  MG  --   --   --   --   --  1.8  PROT  --   --   --   --   --  6.5  ALBUMIN  --   --   --   --   --  3.6  AST  --   --   --   --   --  18  ALT  --   --   --   --   --  12  ALKPHOS  --   --   --   --   --  26*  BILITOT  --   --   --   --   --  1.0  GFRNONAA  --   --  53*  --   --  48*  ANIONGAP  --   --  11  --   --  9   < > = values in this interval not displayed.    Lipids No results for input(s): CHOL, TRIG, HDL, LABVLDL, LDLCALC, CHOLHDL in the last 168 hours.  Hematology Recent Labs  Lab 06/27/21 1443 06/27/21 1503 06/27/21 1504 06/28/21 0337  WBC 8.2  --   --  7.4  RBC 3.52*  --   --  3.47*  HGB 10.6* 11.6* 11.6* 10.3*  HCT 33.1* 34.0* 34.0* 32.1*  MCV 94.0  --   --  92.5  MCH  30.1  --   --  29.7  MCHC 32.0  --   --  32.1  RDW 15.7*  --   --  15.3  PLT 282  --   --  250   Thyroid  Recent Labs  Lab 06/27/21 1835  TSH 2.578    BNP Recent Labs  Lab 06/24/21 1139 06/27/21 1446 06/28/21 0337  BNP  --  854.4* 743.6*  PROBNP 6,337*  --   --     DDimer No results for input(s): DDIMER in the last 168 hours.   Radiology    DG Chest Port 1 View  Result Date: 06/27/2021 CLINICAL DATA:  Shortness of breath. Progressive worsening over 3 weeks. EXAM: PORTABLE CHEST 1 VIEW COMPARISON:  AP chest views 07/23/2020 FINDINGS: Left chest wall cardiac pacer is again seen with leads overlying the right atrium and right ventricle. Status post median sternotomy. Cardiac silhouette is again moderately enlarged. Cardiac valve prosthesis is again noted overlying the mid heart. Mediastinal contours are grossly within normal limits. Mild interval increase in mild-to-moderate interstitial thickening bilaterally. Possible tiny bilateral pleural effusions. No pneumothorax. No acute skeletal abnormality. Right axillary surgical clips are again noted. IMPRESSION: Interval increase in mild-to-moderate interstitial thickening likely mild to moderate interstitial pulmonary edema. Stable cardiomegaly. Electronically Signed   By: Yvonne Kendall M.D.   On: 06/27/2021 14:54    Cardiac Studies   Echocardiogram 03/28/2020: Impressions: 1. Left ventricular ejection fraction, by estimation, is 50 to 55%. The  left ventricle has low normal function. Left ventricular endocardial  border not optimally defined to evaluate regional wall motion. Left  ventricular diastolic parameters are  indeterminate.   2. Right ventricular systolic function is mildly reduced. The right  ventricular size is normal. Tricuspid regurgitation signal is inadequate  for assessing PA pressure.   3. The mitral valve is degenerative. No evidence of mitral valve  regurgitation. No evidence of mitral stenosis. Moderate mitral  annular  calcification.   4. The aortic valve has been repaired/replaced. Aortic valve  regurgitation is trivial. No aortic stenosis is present. Mechanical valve  leaflets not well visualized. There is a 25 mm St. Jude mechanical valved  conduit valve present in the aortic  position. Procedure Date: 05/22/2008. Echo findings are consistent with  normal structure and function of the aortic valve prosthesis. Aortic valve  mean gradient measures 7.0 mmHg.   5. Aortic root/ascending aorta has been repaired/replaced. Grossly normal  appearance in proximal segment, not well visualized beyond proximal  ascending aorta.   6. The inferior vena cava is normal in size with greater than 50%  respiratory variability, suggesting right atrial pressure of 3 mmHg.   Conclusion(s)/Recommendation(s): No intracardiac source of embolism  detected on this transthoracic study. A transesophageal echocardiogram is  recommended to exclude cardiac source of embolism if clinically indicated.  Patient Profile     71 y.o. female with a history of normal coronaries on cardiac catheterization in 03/2008, bicuspid aortic valve with aortic stenosis and ascending aortic aneurysm s/p resection/grafting of ascending aortic aneurysm with placement of St. Jude mechanical aortic valve conduit/Bentall procedure in 69/6295, chronic diastolic CHF, persistent atrial fibrillation/flutter, tachy-brady syndrome s/p [employer] PPM implantation in 12/2019, mild bilateral carotid artery stenosis on dopplers in 03/2020, hypertension, hyperlipidemia, type 2 diabetes mellitus, CKD stage III, hypothyroidism, chronic infarcts noted on brain MRI in 2021, multiple falls with prior craniotomy for subdural hematoma/subarachnoid hemorrhage, anemia, depression, bipolar disorder, chronic back pain, obesity, and debilitation (wheelchair bound). She was recently seen in our office on 06/24/2021 with worsening dyspnea and edema after being off her Lasix  at SNF. Lasix was restarted at that time but symptoms worsen and she was ultimately admitted on 06/27/2021 with acute on chronic CHF and atrial fibrillation with RVR.  Assessment & Plan    Acute Hypoxic Respiratory Failure  Acute on Chronic Diastolic CHF Patient presented with worsening dyspnea and edema. Initially required BiPAP in the ED. BNP 854 >> 743. Chest x-ray showed interval increase in mild to moderate interstitial thickening felt to likely represent mild to moderate interstitial pulmonary edema. Last Echo in 03/2020 showed LVEF of 50-55%. She was started on IV Lasix with excellent response. Documented output of 3.65 L yesterday. Weight down 6 lbs from yesterday. Renal function stable. - No significant edema on exam but still has crackles in bases. - Agree with additional dose of IV Lasix 40mg  today. - Repeat Echo pending. - Continue to monitor daily weight, strict I/O's, and renal function.  Persistent Atrial Fibrillation/Flutter  Tachy-Brady Syndrome s/p [employer] PPM  S/p 25 mm St. Jude Mechanical Valve History of atrial fibrillation/flutter with prior DCCV in the past. Previously on Amiodarone but this was stopped due to prolonged QTc with bipolar medications (there was also concern that the Amiodarone was contributing to dizziness and propensity to fall). She was maintaining sinus rhythm at last office visit. Presented in atrial fibrillation with rates in the 130s. - Still in atrial fibrillation with rates mostly in the 80s to 90s. - Will increase Coreg to 6.25mg  twice daily for more rate control. BP is sometimes on the softer side - if more rate control is needed, may need to switch to metoprolol. - Continue chronic anticoagulation with Coumadin. INR 2.8 today. Dosing per Pharmacy.   Hypertension BP  well controlled. - Continue Coreg as above.  Hyperlipidemia - Continue home Crestor 40mg  daily.  CKD Stage III Baseline creatinine around 1.1 to 1.3. - Stable at 1.21  today.  Hypokalemia Potassium 3.4 today. Being repleted by primary team. - Continue to monitor.  Otherwise, per primary team: - Type 2 diabetes mellitus - Hypothyroidism  - Chronic anemia - Depression/bipolar disorder - Chronic back pain  For questions or updates, please contact [facility] Please consult [email or web address] for contact info under        Signed, Darreld Mclean, PA-C  06/28/2021, 7:29 AM    Personally seen and examined. Agree with APP above with the following comments: Briefly persistent AF and QTc prolongation with amiodarone and bipolar medications, who presents with worsening SOB Patient notes that her breathing is not back to baseline.  Still have cough (non productive).  Still feels like her legs are swollen. Exam notable for Crackles bilaterally, IRIR, non pitting edema, Crisp mechanical heart sounds Labs notable for Creatinine stable at 1.2, INR 2.8, K 3.4 Personally reviewed relevant tests; AF Rates 80s-110s, occasional PVCs Would recommend  - Will increase lasix to 40 IV BID for today, have given increase K for the PM - will increase coreg to 6.25 mg PO BID - We have discussed DCCV; if her she does not severe LA enlargement, a trial of DCCV may be reasonable - long term we have limited AAD therapies left that would not have similar interactions - will see EP as outpatient again, but I am not sure she would be ideal for AF ablation - repeat echo is pending  Rudean Haskell, MD [facility]  [address] [place], [place] [address] [phone]  10:37 AM

## 2021-06-28 NOTE — Progress Notes (Signed)
PROGRESS NOTE                                                                                                                                                                                                             Patient Demographics:    [name], is a 71 y.o. female, DOB - 1950-09-23, SLH:[id number]  Outpatient Primary MD for the patient is Mosie Lukes, MD    LOS - 1  Admit date - 06/27/2021    Chief Complaint  Patient presents with   Shortness of Breath            Brief Narrative (HPI from H&P)  71 y.o. female, with history of mechanical Saint Jude aortic valve placed in 2011, paroxysmal atrial fibrillation [place] vas 2 score of greater than 5 with embolic strokes, multiple falls with subdural bleed requiring craniotomy, DM type II, bipolar disorder, migraine, hypertension, syncope, tachybradycardia syndrome s/p pacemaker placement, dyslipidemia, who lives in a nursing home and presented with shortness of breath and was admitted with a diagnosis of CHF.   Subjective:    [name] today has, No headache, No chest pain, No abdominal pain - No Nausea, No new weakness tingling or numbness, much improved SOB.   Assessment  & Plan :    Assessment and Plan: No notes have been filed under this hospital service. Service: Hospitalist    1.  Acute hypoxic respiratory failure brought on by acute on chronic diastolic CHF EF 15% on last echocardiogram.    Due to missed Lasix doses at SNF.  She has responded very well to IV Lasix with 3-1/2 to 4 L of diuresis in the first 24 hours, shortness of breath much improved from BiPAP she is down to 2 L nasal cannula oxygen and feels better.  Continue gentle diuresis and Coreg, cardiology on board likely discharge in the next 1 to 2 days if continues to be better.   2.  Paroxysmal atrial fibrillation with [place] vas 2 score of greater than 5 with history of embolic strokes.  Coreg  and Coumadin combination to be continued, pharmacy monitoring INR.   3.  History of embolic strokes due to A. fib with multiple falls and subdural hematoma and bleeds.  Supportive care, PT OT high fall risk.  Post discharge back to SNF.   4.  Dyslipidemia.  Continue home dose statin   5.  Hypothyroidism.  Continue home dose Synthroid.   6.  Hypertension.  Continue Coreg and diuresis with Lasix and monitor.   7.  GERD.  On PPI continue.   8.  CKD 3A.  Baseline creatinine of 1.2.  Monitor.   9.  History of mechanical Saint Jude aortic valve placed in 2011.  Continue with supportive care.  She is on Coumadin but most likely due to A. fib with embolic strokes.   10.  History of migraine headaches.  Monitor with supportive care.   11.  History of delirium.  She seems to be on extremely high doses of Seroquel, will have pharmacy reconcile her home medications and then address.  Will be at risk for encephalopathy in the hospital setting.  Will follow fall precautions and aspiration precautions while here, minimize narcotics and benzodiazepine use.  12.  Hypokalemia.  Replaced.      Condition - Fair  Family Communication  :  Daughter Warren Lacy [phone] 06/27/21, 06/28/21  Code Status :  Full  Consults  :  Cards  PUD Prophylaxis :    Procedures  :     TTE      Disposition Plan  :    Status is: Inpatient - CHF  DVT Prophylaxis  :    Place and maintain sequential compression device Start: 06/27/21 1645 warfarin (COUMADIN) tablet 3.5 mg    Lab Results  Component Value Date   PLT 250 06/28/2021    Diet :  Diet Order             DIET SOFT Room service appropriate? Yes; Fluid consistency: Thin; Fluid restriction: 1500 mL Fluid  Diet effective now                    Inpatient Medications  Scheduled Meds:  carvedilol  6.25 mg Oral BID WC   cholecalciferol  1,000 Units Oral Daily   docusate sodium  100 mg Oral BID   fenofibrate  160 mg Oral Daily   furosemide  40 mg  Intravenous Once   lamoTRIgine  100 mg Oral Daily   lamoTRIgine  200 mg Oral QHS   levothyroxine  25 mcg Oral QAC breakfast   pantoprazole  40 mg Oral Daily   potassium chloride  40 mEq Oral Once   potassium chloride  40 mEq Oral Once   rosuvastatin  40 mg Oral Daily   sodium chloride flush  3 mL Intravenous Q12H   cyanocobalamin  1,000 mcg Oral Daily   warfarin  3.5 mg Oral ONCE-1600   Warfarin - Pharmacist Dosing Inpatient   Does not apply q1600   Continuous Infusions: PRN Meds:.acetaminophen **OR** acetaminophen, albuterol, bisacodyl, hydrALAZINE, ondansetron **OR** ondansetron (ZOFRAN) IV, SUMAtriptan  Antibiotics  :    Anti-infectives (From admission, onward)    None        Time Spent in minutes  30   Lala Lund M.D on 06/28/2021 at 12:05 PM  To page go to [email or web address]   [facility] -  Office  [phone]  See all Orders from today for further details    Objective:   Vitals:   06/28/21 0049 06/28/21 0139 06/28/21 0429 06/28/21 0811  BP: 121/81 119/81 120/78 108/75  Pulse: 74 (!) 104 87 90  Resp: (!) 21 (!) 24 (!) 23   Temp:   98.8 F (37.1 C)   TempSrc:   Oral   SpO2: 99% 98% 93%   Weight:  93.9 kg   Height:        Wt Readings from Last 3 Encounters:  06/28/21 93.9 kg  06/24/21 95.3 kg  01/02/21 100.1 kg     Intake/Output Summary (Last 24 hours) at 06/28/2021 1205 Last data filed at 06/28/2021 0631 Gross per 24 hour  Intake 897 ml  Output 3650 ml  Net -2753 ml     Physical Exam  Awake Alert, No new F.N deficits, Normal affect [place].AT,PERRAL Supple Neck, No JVD,   Symmetrical Chest wall movement, Good air movement bilaterally, mild rales RRR,No Gallops, +ve systolic murmur +ve B.Sounds, Abd Soft, No tenderness,   No Cyanosis, Clubbing or edema      Data Review:    CBC Recent Labs  Lab 06/27/21 1443 06/27/21 1503 06/27/21 1504 06/28/21 0337  WBC 8.2  --   --  7.4  HGB 10.6* 11.6* 11.6* 10.3*  HCT 33.1* 34.0* 34.0*  32.1*  PLT 282  --   --  250  MCV 94.0  --   --  92.5  MCH 30.1  --   --  29.7  MCHC 32.0  --   --  32.1  RDW 15.7*  --   --  15.3  LYMPHSABS 1.1  --   --  1.8  MONOABS 0.4  --   --  0.5  EOSABS 0.2  --   --  0.2  BASOSABS 0.1  --   --  0.1    Electrolytes Recent Labs  Lab 06/24/21 1139 06/27/21 1443 06/27/21 1446 06/27/21 1503 06/27/21 1504 06/27/21 1835 06/28/21 0337  NA 141 138  --  139 140  --  138  K 3.9 3.8  --  3.9 3.9  --  3.4*  CL 103 103  --   --  104  --  101  CO2 25 24  --   --   --   --  28  GLUCOSE 116* 134*  --   --  130*  --  110*  BUN 13 21  --   --  21  --  20  CREATININE 1.14* 1.11*  --   --  1.10*  --  1.21*  CALCIUM 9.3 8.9  --   --   --   --  8.7*  AST  --   --   --   --   --   --  18  ALT  --   --   --   --   --   --  12  ALKPHOS  --   --   --   --   --   --  26*  BILITOT  --   --   --   --   --   --  1.0  ALBUMIN  --   --   --   --   --   --  3.6  MG  --   --   --   --   --   --  1.8  CRP  --   --   --   --   --  0.6  --   INR  --   --   --   --   --  2.7* 2.8*  TSH  --   --   --   --   --  2.578  --   BNP  --   --  854.4*  --   --   --  743.6*    ------------------------------------------------------------------------------------------------------------------  No results for input(s): CHOL, HDL, LDLCALC, TRIG, CHOLHDL, LDLDIRECT in the last 72 hours.  Lab Results  Component Value Date   HGBA1C 4.8 07/24/2020    Recent Labs    06/27/21 1835  TSH 2.578   ------------------------------------------------------------------------------------------------------------------ ID Labs Recent Labs  Lab 06/24/21 1139 06/27/21 1443 06/27/21 1504 06/27/21 1835 06/28/21 0337  WBC  --  8.2  --   --  7.4  PLT  --  282  --   --  250  CRP  --   --   --  0.6  --   CREATININE 1.14* 1.11* 1.10*  --  1.21*   Cardiac Enzymes No results for input(s): CKMB, TROPONINI, MYOGLOBIN in the last 168 hours.  Invalid input(s): CK   Radiology Reports DG  Chest Port 1 View  Result Date: 06/27/2021 CLINICAL DATA:  Shortness of breath. Progressive worsening over 3 weeks. EXAM: PORTABLE CHEST 1 VIEW COMPARISON:  AP chest views 07/23/2020 FINDINGS: Left chest wall cardiac pacer is again seen with leads overlying the right atrium and right ventricle. Status post median sternotomy. Cardiac silhouette is again moderately enlarged. Cardiac valve prosthesis is again noted overlying the mid heart. Mediastinal contours are grossly within normal limits. Mild interval increase in mild-to-moderate interstitial thickening bilaterally. Possible tiny bilateral pleural effusions. No pneumothorax. No acute skeletal abnormality. Right axillary surgical clips are again noted. IMPRESSION: Interval increase in mild-to-moderate interstitial thickening likely mild to moderate interstitial pulmonary edema. Stable cardiomegaly. Electronically Signed   By: Yvonne Kendall M.D.   On: 06/27/2021 14:54

## 2021-06-28 NOTE — Progress Notes (Incomplete)
°  Echocardiogram 2D Echocardiogram has been performed.  Bobbye Charleston 06/28/2021, 12:14 PM

## 2021-06-29 DIAGNOSIS — I5043 Acute on chronic combined systolic (congestive) and diastolic (congestive) heart failure: Secondary | ICD-10-CM | POA: Diagnosis not present

## 2021-06-29 DIAGNOSIS — J9601 Acute respiratory failure with hypoxia: Secondary | ICD-10-CM | POA: Diagnosis not present

## 2021-06-29 DIAGNOSIS — I4891 Unspecified atrial fibrillation: Secondary | ICD-10-CM | POA: Diagnosis not present

## 2021-06-29 DIAGNOSIS — Z950 Presence of cardiac pacemaker: Secondary | ICD-10-CM | POA: Diagnosis not present

## 2021-06-29 LAB — COMPREHENSIVE METABOLIC PANEL
ALT: 11 U/L (ref 0–44)
AST: 18 U/L (ref 15–41)
Albumin: 4 g/dL (ref 3.5–5.0)
Alkaline Phosphatase: 33 U/L — ABNORMAL LOW (ref 38–126)
Anion gap: 12 (ref 5–15)
BUN: 31 mg/dL — ABNORMAL HIGH (ref 8–23)
CO2: 29 mmol/L (ref 22–32)
Calcium: 10.3 mg/dL (ref 8.9–10.3)
Chloride: 100 mmol/L (ref 98–111)
Creatinine, Ser: 1.4 mg/dL — ABNORMAL HIGH (ref 0.44–1.00)
GFR, Estimated: 40 mL/min — ABNORMAL LOW (ref >60)
Glucose, Bld: 143 mg/dL — ABNORMAL HIGH (ref 70–99)
Potassium: 4.3 mmol/L (ref 3.5–5.1)
Sodium: 141 mmol/L (ref 135–145)
Total Bilirubin: 0.8 mg/dL (ref 0.3–1.2)
Total Protein: 7.3 g/dL (ref 6.5–8.1)

## 2021-06-29 LAB — CBC WITH DIFFERENTIAL/PLATELET
Abs Immature Granulocytes: 0.03 10*3/uL (ref 0.00–0.07)
Basophils Absolute: 0.1 10*3/uL (ref 0.0–0.1)
Basophils Relative: 1 %
Eosinophils Absolute: 0.2 10*3/uL (ref 0.0–0.5)
Eosinophils Relative: 3 %
HCT: 35.5 % — ABNORMAL LOW (ref 36.0–46.0)
Hemoglobin: 11.3 g/dL — ABNORMAL LOW (ref 12.0–15.0)
Immature Granulocytes: 0 %
Lymphocytes Relative: 24 %
Lymphs Abs: 1.9 10*3/uL (ref 0.7–4.0)
MCH: 29.7 pg (ref 26.0–34.0)
MCHC: 31.8 g/dL (ref 30.0–36.0)
MCV: 93.2 fL (ref 80.0–100.0)
Monocytes Absolute: 0.8 10*3/uL (ref 0.1–1.0)
Monocytes Relative: 10 %
Neutro Abs: 4.7 10*3/uL (ref 1.7–7.7)
Neutrophils Relative %: 62 %
Platelets: 322 10*3/uL (ref 150–400)
RBC: 3.81 MIL/uL — ABNORMAL LOW (ref 3.87–5.11)
RDW: 15.3 % (ref 11.5–15.5)
WBC: 7.7 10*3/uL (ref 4.0–10.5)
nRBC: 0 % (ref 0.0–0.2)

## 2021-06-29 LAB — PROTIME-INR
INR: 2.7 — ABNORMAL HIGH (ref 0.8–1.2)
Prothrombin Time: 28.6 seconds — ABNORMAL HIGH (ref 11.4–15.2)

## 2021-06-29 LAB — BRAIN NATRIURETIC PEPTIDE: B Natriuretic Peptide: 432.2 pg/mL — ABNORMAL HIGH (ref 0.0–100.0)

## 2021-06-29 LAB — MAGNESIUM: Magnesium: 2.1 mg/dL (ref 1.7–2.4)

## 2021-06-29 MED ORDER — LOSARTAN POTASSIUM 25 MG PO TABS
25.0000 mg | ORAL_TABLET | Freq: Every day | ORAL | Status: DC
  Administered 2021-06-29 – 2021-07-06 (×8): 25 mg via ORAL
  Filled 2021-06-29 (×8): qty 1

## 2021-06-29 MED ORDER — MELATONIN 5 MG PO TABS
5.0000 mg | ORAL_TABLET | Freq: Every evening | ORAL | Status: DC | PRN
  Administered 2021-06-30 – 2021-07-05 (×5): 5 mg via ORAL
  Filled 2021-06-29 (×5): qty 1

## 2021-06-29 MED ORDER — WARFARIN SODIUM 2.5 MG PO TABS
3.5000 mg | ORAL_TABLET | Freq: Once | ORAL | Status: AC
Start: 2021-06-29 — End: 2021-06-29
  Administered 2021-06-29: 3.5 mg via ORAL
  Filled 2021-06-29: qty 1

## 2021-06-29 MED ORDER — LOSARTAN POTASSIUM 25 MG PO TABS
25.0000 mg | ORAL_TABLET | Freq: Every day | ORAL | Status: DC
Start: 2021-06-29 — End: 2021-06-29

## 2021-06-29 NOTE — Plan of Care (Signed)

## 2021-06-29 NOTE — Progress Notes (Signed)
PROGRESS NOTE                                                                                                                                                                                                             Patient Demographics:    [name], is a 71 y.o. female, DOB - 09-09-1950, HRC:[id number]  Outpatient Primary MD for the patient is Mosie Lukes, MD    LOS - 2  Admit date - 06/27/2021    Chief Complaint  Patient presents with   Shortness of Breath            Brief Narrative (HPI from H&P)  71 y.o. female, with history of mechanical Saint Jude aortic valve placed in 2011, paroxysmal atrial fibrillation [place] vas 2 score of greater than 5 with embolic strokes, multiple falls with subdural bleed requiring craniotomy, DM type II, bipolar disorder, migraine, hypertension, syncope, tachybradycardia syndrome s/p pacemaker placement, dyslipidemia, who lives in a nursing home and presented with shortness of breath and was admitted with a diagnosis of CHF.   Subjective:   Patient in bed, appears comfortable, denies any headache, no fever, no chest pain or pressure, no shortness of breath , no abdominal pain. No new focal weakness.   Assessment  & Plan :    Assessment and Plan: No notes have been filed under this hospital service. Service: Hospitalist    1.  Acute hypoxic respiratory failure brought on by acute on chronic systolic and diastolic heart failure EF now percent down from 50% few years ago.  She also dropped a few doses of Lasix at SNF due to some miscommunication, no chest pain or acute EKG changes.  She has responded very well to IV Lasix with 3-1/2 to 4 L of diuresis in the first 24 hours, shortness of breath much improved from BiPAP she is down to room air and exam shows little to no fluid over the.  Case discussed with the cardiologist on 06/29/2021 plan is to continue Coreg, add low-dose Cozaar and  monitor her clinical response.  For now refraining from left heart cath.  Monitor with optimization of her medical regimen.  Drop in EF could be due to uncontrolled A. fib.   2.  Paroxysmal atrial fibrillation with [place] vas 2 score of greater than 5 with history of embolic strokes.  Coreg and Coumadin combination to be continued, pharmacy monitoring INR.   3.  History of embolic strokes due to A. fib with multiple falls and subdural hematoma and bleeds.  Supportive care, PT OT high fall risk.  Post discharge back to SNF.   4.  Dyslipidemia.  Continue home dose statin   5.  Hypothyroidism.  Continue home dose Synthroid.   6.  Hypertension.  Continue Coreg and diuresis with Lasix and monitor.   7.  GERD.  On PPI continue.   8.  CKD 3A.  Baseline creatinine between baseline of 1.2 and 1.4.  Monitor with addition of Cozaar.   9.  History of mechanical Saint Jude aortic valve placed in 2011.  Continue with supportive care.  She is on Coumadin but most likely due to A. fib with embolic strokes.   10.  History of migraine headaches.  Monitor with supportive care.   11.  History of delirium.  She seems to be on extremely high doses of Seroquel, will have pharmacy reconcile her home medications and then address.  Will be at risk for encephalopathy in the hospital setting.  Will follow fall precautions and aspiration precautions while here, minimize narcotics and benzodiazepine use.  12.  Hypokalemia.  Replaced.       Condition - Fair  Family Communication  :  Daughter Amy [phone] 06/27/21, 06/28/21, 06/29/2021  Code Status :  Full  Consults  :  Cards  PUD Prophylaxis :    Procedures  :     TTE - 1. EF has decreased since echo done 2021 . Left ventricular ejection fraction, by estimation, is 25 to 30%. The left ventricle has severely decreased function. The left ventricle demonstrates global hypokinesis. The left ventricular internal cavity size  was moderately dilated. Left ventricular  diastolic parameters are indeterminate.  2. Pacing wires in RV/RA. Right ventricular systolic function is normal. The right ventricular size is normal. There is mildly elevated pulmonary artery systolic pressure.  3. Left atrial size was moderately dilated.  4. The mitral valve is degenerative. Mild mitral valve regurgitation. No evidence of mitral stenosis. Moderate mitral annular calcification.  5. Tricuspid valve regurgitation is mild to moderate.  6. Post AVR with 25 mm St Jude trivial closing volume central AR low gradients normal function . The aortic valve has been repaired/replaced. Aortic valve regurgitation is trivial. No aortic stenosis is present. There is a 25 mm St. Jude mechanical valve present in the aortic position.  7. The inferior vena cava is normal in size with greater than 50% respiratory variability, suggesting right atrial pressure of 3 mmHg.      Disposition Plan  :    Status is: Inpatient - CHF  DVT Prophylaxis  :    Place and maintain sequential compression device Start: 06/27/21 1645    Lab Results  Component Value Date   PLT 322 06/29/2021    Diet :  Diet Order             DIET SOFT Room service appropriate? Yes; Fluid consistency: Thin; Fluid restriction: 1500 mL Fluid  Diet effective now                    Inpatient Medications  Scheduled Meds:  carvedilol  6.25 mg Oral BID WC   cholecalciferol  1,000 Units Oral Daily   docusate sodium  100 mg Oral BID   fenofibrate  160 mg Oral Daily   lamoTRIgine  100 mg Oral Daily  lamoTRIgine  200 mg Oral QHS   levothyroxine  25 mcg Oral QAC breakfast   losartan  25 mg Oral Daily   pantoprazole  40 mg Oral Daily   rosuvastatin  40 mg Oral Daily   sodium chloride flush  3 mL Intravenous Q12H   cyanocobalamin  1,000 mcg Oral Daily   Warfarin - Pharmacist Dosing Inpatient   Does not apply q1600   Continuous Infusions: PRN Meds:.acetaminophen **OR** acetaminophen, albuterol, bisacodyl, hydrALAZINE,  ondansetron **OR** ondansetron (ZOFRAN) IV, SUMAtriptan  Antibiotics  :    Anti-infectives (From admission, onward)    None        Time Spent in minutes  30   Lala Lund M.D on 06/29/2021 at 10:43 AM  To page go to [email or web address]   [facility] -  Office  [phone]  See all Orders from today for further details    Objective:   Vitals:   06/28/21 2009 06/28/21 2013 06/29/21 0510 06/29/21 0930  BP:  120/88 122/80 122/63  Pulse:  (!) 105 86   Resp: 20  20   Temp: 98.5 F (36.9 C)  97.8 F (36.6 C)   TempSrc: Oral  Oral   SpO2:   94%   Weight:   91.6 kg   Height:        Wt Readings from Last 3 Encounters:  06/29/21 91.6 kg  06/24/21 95.3 kg  01/02/21 100.1 kg     Intake/Output Summary (Last 24 hours) at 06/29/2021 1043 Last data filed at 06/28/2021 1912 Gross per 24 hour  Intake 300 ml  Output 950 ml  Net -650 ml     Physical Exam  Awake Alert, No new F.N deficits, Normal affect [place].AT,PERRAL Supple Neck, No JVD,   Symmetrical Chest wall movement, Good air movement bilaterally, CTAB RRR,No Gallops, Rubs or new Murmurs,  +ve B.Sounds, Abd Soft, No tenderness,   No Cyanosis, Clubbing or edema      Data Review:    CBC Recent Labs  Lab 06/27/21 1443 06/27/21 1503 06/27/21 1504 06/28/21 0337 06/29/21 0242  WBC 8.2  --   --  7.4 7.7  HGB 10.6* 11.6* 11.6* 10.3* 11.3*  HCT 33.1* 34.0* 34.0* 32.1* 35.5*  PLT 282  --   --  250 322  MCV 94.0  --   --  92.5 93.2  MCH 30.1  --   --  29.7 29.7  MCHC 32.0  --   --  32.1 31.8  RDW 15.7*  --   --  15.3 15.3  LYMPHSABS 1.1  --   --  1.8 1.9  MONOABS 0.4  --   --  0.5 0.8  EOSABS 0.2  --   --  0.2 0.2  BASOSABS 0.1  --   --  0.1 0.1    Electrolytes Recent Labs  Lab 06/24/21 1139 06/27/21 1443 06/27/21 1446 06/27/21 1503 06/27/21 1504 06/27/21 1835 06/28/21 0337 06/29/21 0242  NA 141 138  --  139 140  --  138 141  K 3.9 3.8  --  3.9 3.9  --  3.4* 4.3  CL 103 103  --   --  104  --   101 100  CO2 25 24  --   --   --   --  28 29  GLUCOSE 116* 134*  --   --  130*  --  110* 143*  BUN 13 21  --   --  21  --  20 31*  CREATININE 1.14*  1.11*  --   --  1.10*  --  1.21* 1.40*  CALCIUM 9.3 8.9  --   --   --   --  8.7* 10.3  AST  --   --   --   --   --   --  18 18  ALT  --   --   --   --   --   --  12 11  ALKPHOS  --   --   --   --   --   --  26* 33*  BILITOT  --   --   --   --   --   --  1.0 0.8  ALBUMIN  --   --   --   --   --   --  3.6 4.0  MG  --   --   --   --   --   --  1.8 2.1  CRP  --   --   --   --   --  0.6  --   --   INR  --   --   --   --   --  2.7* 2.8* 2.7*  TSH  --   --   --   --   --  2.578  --   --   BNP  --   --  854.4*  --   --   --  743.6* 432.2*    ------------------------------------------------------------------------------------------------------------------ No results for input(s): CHOL, HDL, LDLCALC, TRIG, CHOLHDL, LDLDIRECT in the last 72 hours.  Lab Results  Component Value Date   HGBA1C 4.8 07/24/2020    Recent Labs    06/27/21 1835  TSH 2.578   ------------------------------------------------------------------------------------------------------------------ ID Labs Recent Labs  Lab 06/24/21 1139 06/27/21 1443 06/27/21 1504 06/27/21 1835 06/28/21 0337 06/29/21 0242  WBC  --  8.2  --   --  7.4 7.7  PLT  --  282  --   --  250 322  CRP  --   --   --  0.6  --   --   CREATININE 1.14* 1.11* 1.10*  --  1.21* 1.40*   Cardiac Enzymes No results for input(s): CKMB, TROPONINI, MYOGLOBIN in the last 168 hours.  Invalid input(s): CK   Radiology Reports DG Chest Port 1 View  Result Date: 06/27/2021 CLINICAL DATA:  Shortness of breath. Progressive worsening over 3 weeks. EXAM: PORTABLE CHEST 1 VIEW COMPARISON:  AP chest views 07/23/2020 FINDINGS: Left chest wall cardiac pacer is again seen with leads overlying the right atrium and right ventricle. Status post median sternotomy. Cardiac silhouette is again moderately enlarged. Cardiac valve  prosthesis is again noted overlying the mid heart. Mediastinal contours are grossly within normal limits. Mild interval increase in mild-to-moderate interstitial thickening bilaterally. Possible tiny bilateral pleural effusions. No pneumothorax. No acute skeletal abnormality. Right axillary surgical clips are again noted. IMPRESSION: Interval increase in mild-to-moderate interstitial thickening likely mild to moderate interstitial pulmonary edema. Stable cardiomegaly. Electronically Signed   By: Yvonne Kendall M.D.   On: 06/27/2021 14:54   ECHOCARDIOGRAM COMPLETE  Result Date: 06/28/2021    ECHOCARDIOGRAM REPORT   Patient Name:   [name] Date of Exam: 06/28/2021 Medical Rec #:  [id number]     Height:       68.0 in Accession #:    [id number]    Weight:       207.0 lb Date of Birth:  August 15, 1950  BSA:          2.074 m Patient Age:    68 years      BP:           108/75 mmHg Patient Gender: F             HR:           102 bpm. Exam Location:  Inpatient Procedure: 2D Echo, 3D Echo, Intracardiac Opacification Agent, Cardiac Doppler            and Color Doppler Indications:    I50.40* Unspecified combined systolic (congestive) and diastolic                 (congestive) heart failure  History:        Patient has prior history of Echocardiogram examinations, most                 recent 03/28/2020. Abnormal ECG and Pacemaker, Aortic Valve                 Disease, Arrythmias:Tachycardia, Bradycardia and Atrial                 Fibrillation, Signs/Symptoms:Syncope and Altered Mental Status;                 Risk Factors:Dyslipidemia and Hypertension.                 Aortic Valve: 25 mm St. Jude mechanical valve is present in the                 aortic position.  Sonographer:    Roseanna Rainbow RDCS Referring Phys: [id number] Margaree Mackintosh [facility]  Sonographer Comments: Technically difficult study due to poor echo windows. Image acquisition challenging due to patient body habitus. IMPRESSIONS  1. EF has decreased since echo done 2021 . Left  ventricular ejection fraction, by estimation, is 25 to 30%. The left ventricle has severely decreased function. The left ventricle demonstrates global hypokinesis. The left ventricular internal cavity size  was moderately dilated. Left ventricular diastolic parameters are indeterminate.  2. Pacing wires in RV/RA. Right ventricular systolic function is normal. The right ventricular size is normal. There is mildly elevated pulmonary artery systolic pressure.  3. Left atrial size was moderately dilated.  4. The mitral valve is degenerative. Mild mitral valve regurgitation. No evidence of mitral stenosis. Moderate mitral annular calcification.  5. Tricuspid valve regurgitation is mild to moderate.  6. Post AVR with 25 mm St Jude trivial closing volume central AR low gradients normal function . The aortic valve has been repaired/replaced. Aortic valve regurgitation is trivial. No aortic stenosis is present. There is a 25 mm St. Jude mechanical valve present in the aortic position.  7. The inferior vena cava is normal in size with greater than 50% respiratory variability, suggesting right atrial pressure of 3 mmHg. FINDINGS  Left Ventricle: EF has decreased since echo done 2021. Left ventricular ejection fraction, by estimation, is 25 to 30%. The left ventricle has severely decreased function. The left ventricle demonstrates global hypokinesis. Definity contrast agent was given IV to delineate the left ventricular endocardial borders. The left ventricular internal cavity size was moderately dilated. There is no left ventricular hypertrophy. Left ventricular diastolic parameters are indeterminate. Right Ventricle: Pacing wires in RV/RA. The right ventricular size is normal. No increase in right ventricular wall thickness. Right ventricular systolic function is normal. There is mildly elevated pulmonary artery systolic pressure. The tricuspid regurgitant velocity is 2.69 m/s,  and with an assumed right atrial pressure of 8  mmHg, the estimated right ventricular systolic pressure is 87.5 mmHg. Left Atrium: Left atrial size was moderately dilated. Right Atrium: Right atrial size was normal in size. Pericardium: There is no evidence of pericardial effusion. Mitral Valve: The mitral valve is degenerative in appearance. There is moderate thickening of the mitral valve leaflet(s). There is moderate calcification of the mitral valve leaflet(s). Moderate mitral annular calcification. Mild mitral valve regurgitation. No evidence of mitral valve stenosis. MV peak gradient, 6.8 mmHg. The mean mitral valve gradient is 3.5 mmHg. Tricuspid Valve: The tricuspid valve is normal in structure. Tricuspid valve regurgitation is mild to moderate. No evidence of tricuspid stenosis. Aortic Valve: Post AVR with 25 mm St Jude trivial closing volume central AR low gradients normal function. The aortic valve has been repaired/replaced. Aortic valve regurgitation is trivial. No aortic stenosis is present. Aortic valve mean gradient measures 6.0 mmHg. Aortic valve peak gradient measures 11.2 mmHg. Aortic valve area, by VTI measures 2.65 cm. There is a 25 mm St. Jude mechanical valve present in the aortic position. Pulmonic Valve: The pulmonic valve was normal in structure. Pulmonic valve regurgitation is not visualized. No evidence of pulmonic stenosis. Aorta: The aortic root is normal in size and structure. Venous: The inferior vena cava is normal in size with greater than 50% respiratory variability, suggesting right atrial pressure of 3 mmHg. IAS/Shunts: No atrial level shunt detected by color flow Doppler. Additional Comments: A device lead is visualized.  LEFT VENTRICLE PLAX 2D LVIDd:         6.00 cm LVIDs:         5.10 cm LV PW:         1.00 cm LV IVS:        1.00 cm LVOT diam:     2.00 cm LV SV:         58 LV SV Index:   28 LVOT Area:     3.14 cm  RIGHT VENTRICLE            IVC RV S prime:     6.90 cm/s  IVC diam: 1.95 cm TAPSE (M-mode): 1.5 cm LEFT  ATRIUM              Index        RIGHT ATRIUM           Index LA diam:        4.40 cm  2.12 cm/m   RA Area:     16.40 cm LA Vol (A2C):   122.0 ml 58.83 ml/m  RA Volume:   45.60 ml  21.99 ml/m LA Vol (A4C):   83.8 ml  40.41 ml/m LA Biplane Vol: 105.0 ml 50.63 ml/m  AORTIC VALVE AV Area (Vmax):    2.39 cm AV Area (Vmean):   2.19 cm AV Area (VTI):     2.65 cm AV Vmax:           167.00 cm/s AV Vmean:          110.500 cm/s AV VTI:            0.218 m AV Peak Grad:      11.2 mmHg AV Mean Grad:      6.0 mmHg LVOT Vmax:         127.00 cm/s LVOT Vmean:        77.100 cm/s LVOT VTI:          0.184 m LVOT/AV VTI ratio:  0.84  AORTA Ao Root diam: 3.10 cm Ao Asc diam:  3.00 cm MITRAL VALVE                  TRICUSPID VALVE MV Area (PHT): 3.92 cm       TR Peak grad:   28.9 mmHg MV Area VTI:   2.23 cm       TR Vmax:        269.00 cm/s MV Peak grad:  6.8 mmHg MV Mean grad:  3.5 mmHg       SHUNTS MV Vmax:       1.30 m/s       Systemic VTI:  0.18 m MV Vmean:      88.8 cm/s      Systemic Diam: 2.00 cm MV Decel Time: 194 msec MR Peak grad:    102.8 mmHg MR Mean grad:    57.0 mmHg MR Vmax:         507.00 cm/s MR Vmean:        341.0 cm/s MR PISA:         0.57 cm MR PISA Eff ROA: 4 mm MR PISA Radius:  0.30 cm MV E velocity: 131.00 cm/s Jenkins Rouge MD Electronically signed by Jenkins Rouge MD Signature Date/Time: 06/28/2021/12:17:05 PM    Final

## 2021-06-29 NOTE — Progress Notes (Signed)
Mobility Specialist Progress Note    06/29/21 1241  Mobility  Bed Position Chair  Activity Ambulated with assistance in hallway  Level of Assistance Contact guard assist, steadying assist  Assistive Device Front wheel walker  Distance Ambulated (ft) 110 ft  Activity Response Tolerated fair  $Mobility charge 1 Mobility   During Mobility: 120 HR Post-Mobility: 94 HR, 141/56 BP  Pt received in bed and agreeable. Had BM in BR. Took a seated rest break before ambulation. No complaints during. Returned to chair with call bell in reach.   [facility] Mobility Specialist  M.S. 2C and 6E: [phone] M.S. 4E: [phone]

## 2021-06-29 NOTE — Progress Notes (Signed)
Progress Note  Patient Name: [name] Date of Encounter: 06/29/2021  [facility] Cardiologist: Sinclair Grooms, MD   Subjective   Sleepy , weak and some exertional dyspnea   Inpatient Medications    Scheduled Meds:  carvedilol  6.25 mg Oral BID WC   cholecalciferol  1,000 Units Oral Daily   docusate sodium  100 mg Oral BID   fenofibrate  160 mg Oral Daily   lamoTRIgine  100 mg Oral Daily   lamoTRIgine  200 mg Oral QHS   levothyroxine  25 mcg Oral QAC breakfast   pantoprazole  40 mg Oral Daily   rosuvastatin  40 mg Oral Daily   sodium chloride flush  3 mL Intravenous Q12H   cyanocobalamin  1,000 mcg Oral Daily   Warfarin - Pharmacist Dosing Inpatient   Does not apply q1600   Continuous Infusions:   PRN Meds: acetaminophen **OR** acetaminophen, albuterol, bisacodyl, hydrALAZINE, ondansetron **OR** ondansetron (ZOFRAN) IV, SUMAtriptan   Vital Signs    Vitals:   06/28/21 2009 06/28/21 2013 06/29/21 0510 06/29/21 0930  BP:  120/88 122/80 122/63  Pulse:  (!) 105 86   Resp: 20  20   Temp: 98.5 F (36.9 C)  97.8 F (36.6 C)   TempSrc: Oral  Oral   SpO2:   94%   Weight:   91.6 kg   Height:        Intake/Output Summary (Last 24 hours) at 06/29/2021 1019 Last data filed at 06/28/2021 1912 Gross per 24 hour  Intake 300 ml  Output 950 ml  Net -650 ml   Last 3 Weights 06/29/2021 06/28/2021 06/27/2021  Weight (lbs) 202 lb 207 lb 213 lb  Weight (kg) 91.627 kg 93.895 kg 96.616 kg      Telemetry    Afib rates 80;s occasional escape pacing   ECG    Afib rate 104 nonspecific ST changes   Physical Exam   Some dementia [name] female looks older than stated age  74: normal Neck supple with no adenopathy JVP normal no bruits no thyromegaly Lungs clear with no wheezing and good diaphragmatic motion Heart:  P7/T0 click SEM  no  AR murmur, no rub, gallop or click PMI normal  PPM under left clavicle  Abdomen: benighn, BS positve, no tenderness, no AAA no  bruit.  No HSM or HJR Distal pulses intact with no bruits No edema Neuro non-focal Skin warm and dry No muscular weakness   Labs    High Sensitivity Troponin:   Recent Labs  Lab 06/27/21 1443  TROPONINIHS 54*     Chemistry Recent Labs  Lab 06/27/21 1443 06/27/21 1503 06/27/21 1504 06/28/21 0337 06/29/21 0242  NA 138   < > 140 138 141  K 3.8   < > 3.9 3.4* 4.3  CL 103  --  104 101 100  CO2 24  --   --  28 29  GLUCOSE 134*  --  130* 110* 143*  BUN 21  --  21 20 31*  CREATININE 1.11*  --  1.10* 1.21* 1.40*  CALCIUM 8.9  --   --  8.7* 10.3  MG  --   --   --  1.8 2.1  PROT  --   --   --  6.5 7.3  ALBUMIN  --   --   --  3.6 4.0  AST  --   --   --  18 18  ALT  --   --   --  12 11  ALKPHOS  --   --   --  26* 33*  BILITOT  --   --   --  1.0 0.8  GFRNONAA 53*  --   --  48* 40*  ANIONGAP 11  --   --  9 12   < > = values in this interval not displayed.    Lipids No results for input(s): CHOL, TRIG, HDL, LABVLDL, LDLCALC, CHOLHDL in the last 168 hours.  Hematology Recent Labs  Lab 06/27/21 1443 06/27/21 1503 06/27/21 1504 06/28/21 0337 06/29/21 0242  WBC 8.2  --   --  7.4 7.7  RBC 3.52*  --   --  3.47* 3.81*  HGB 10.6*   < > 11.6* 10.3* 11.3*  HCT 33.1*   < > 34.0* 32.1* 35.5*  MCV 94.0  --   --  92.5 93.2  MCH 30.1  --   --  29.7 29.7  MCHC 32.0  --   --  32.1 31.8  RDW 15.7*  --   --  15.3 15.3  PLT 282  --   --  250 322   < > = values in this interval not displayed.   Thyroid  Recent Labs  Lab 06/27/21 1835  TSH 2.578    BNP Recent Labs  Lab 06/24/21 1139 06/27/21 1446 06/28/21 0337 06/29/21 0242  BNP  --  854.4* 743.6* 432.2*  PROBNP 6,337*  --   --   --     DDimer No results for input(s): DDIMER in the last 168 hours.   Radiology    DG Chest Port 1 View  Result Date: 06/27/2021 CLINICAL DATA:  Shortness of breath. Progressive worsening over 3 weeks. EXAM: PORTABLE CHEST 1 VIEW COMPARISON:  AP chest views 07/23/2020 FINDINGS: Left chest wall  cardiac pacer is again seen with leads overlying the right atrium and right ventricle. Status post median sternotomy. Cardiac silhouette is again moderately enlarged. Cardiac valve prosthesis is again noted overlying the mid heart. Mediastinal contours are grossly within normal limits. Mild interval increase in mild-to-moderate interstitial thickening bilaterally. Possible tiny bilateral pleural effusions. No pneumothorax. No acute skeletal abnormality. Right axillary surgical clips are again noted. IMPRESSION: Interval increase in mild-to-moderate interstitial thickening likely mild to moderate interstitial pulmonary edema. Stable cardiomegaly. Electronically Signed   By: Yvonne Kendall M.D.   On: 06/27/2021 14:54   ECHOCARDIOGRAM COMPLETE  Result Date: 06/28/2021    ECHOCARDIOGRAM REPORT   Patient Name:   [name] Date of Exam: 06/28/2021 Medical Rec #:  [id number]     Height:       68.0 in Accession #:    [id number]    Weight:       207.0 lb Date of Birth:  March 08, 1951      BSA:          2.074 m Patient Age:    53 years      BP:           108/75 mmHg Patient Gender: F             HR:           102 bpm. Exam Location:  Inpatient Procedure: 2D Echo, 3D Echo, Intracardiac Opacification Agent, Cardiac Doppler            and Color Doppler Indications:    I50.40* Unspecified combined systolic (congestive) and diastolic                 (congestive) heart  failure  History:        Patient has prior history of Echocardiogram examinations, most                 recent 03/28/2020. Abnormal ECG and Pacemaker, Aortic Valve                 Disease, Arrythmias:Tachycardia, Bradycardia and Atrial                 Fibrillation, Signs/Symptoms:Syncope and Altered Mental Status;                 Risk Factors:Dyslipidemia and Hypertension.                 Aortic Valve: 25 mm St. Jude mechanical valve is present in the                 aortic position.  Sonographer:    Roseanna Rainbow RDCS Referring Phys: [id number] [facility]  Sonographer  Comments: Technically difficult study due to poor echo windows. Image acquisition challenging due to patient body habitus. IMPRESSIONS  1. EF has decreased since echo done 2021 . Left ventricular ejection fraction, by estimation, is 25 to 30%. The left ventricle has severely decreased function. The left ventricle demonstrates global hypokinesis. The left ventricular internal cavity size  was moderately dilated. Left ventricular diastolic parameters are indeterminate.  2. Pacing wires in RV/RA. Right ventricular systolic function is normal. The right ventricular size is normal. There is mildly elevated pulmonary artery systolic pressure.  3. Left atrial size was moderately dilated.  4. The mitral valve is degenerative. Mild mitral valve regurgitation. No evidence of mitral stenosis. Moderate mitral annular calcification.  5. Tricuspid valve regurgitation is mild to moderate.  6. Post AVR with 25 mm St Jude trivial closing volume central AR low gradients normal function . The aortic valve has been repaired/replaced. Aortic valve regurgitation is trivial. No aortic stenosis is present. There is a 25 mm St. Jude mechanical valve present in the aortic position.  7. The inferior vena cava is normal in size with greater than 50% respiratory variability, suggesting right atrial pressure of 3 mmHg. FINDINGS  Left Ventricle: EF has decreased since echo done 2021. Left ventricular ejection fraction, by estimation, is 25 to 30%. The left ventricle has severely decreased function. The left ventricle demonstrates global hypokinesis. Definity contrast agent was given IV to delineate the left ventricular endocardial borders. The left ventricular internal cavity size was moderately dilated. There is no left ventricular hypertrophy. Left ventricular diastolic parameters are indeterminate. Right Ventricle: Pacing wires in RV/RA. The right ventricular size is normal. No increase in right ventricular wall thickness. Right ventricular  systolic function is normal. There is mildly elevated pulmonary artery systolic pressure. The tricuspid regurgitant velocity is 2.69 m/s, and with an assumed right atrial pressure of 8 mmHg, the estimated right ventricular systolic pressure is 44.0 mmHg. Left Atrium: Left atrial size was moderately dilated. Right Atrium: Right atrial size was normal in size. Pericardium: There is no evidence of pericardial effusion. Mitral Valve: The mitral valve is degenerative in appearance. There is moderate thickening of the mitral valve leaflet(s). There is moderate calcification of the mitral valve leaflet(s). Moderate mitral annular calcification. Mild mitral valve regurgitation. No evidence of mitral valve stenosis. MV peak gradient, 6.8 mmHg. The mean mitral valve gradient is 3.5 mmHg. Tricuspid Valve: The tricuspid valve is normal in structure. Tricuspid valve regurgitation is mild to moderate. No evidence of tricuspid stenosis. Aortic Valve:  Post AVR with 25 mm St Jude trivial closing volume central AR low gradients normal function. The aortic valve has been repaired/replaced. Aortic valve regurgitation is trivial. No aortic stenosis is present. Aortic valve mean gradient measures 6.0 mmHg. Aortic valve peak gradient measures 11.2 mmHg. Aortic valve area, by VTI measures 2.65 cm. There is a 25 mm St. Jude mechanical valve present in the aortic position. Pulmonic Valve: The pulmonic valve was normal in structure. Pulmonic valve regurgitation is not visualized. No evidence of pulmonic stenosis. Aorta: The aortic root is normal in size and structure. Venous: The inferior vena cava is normal in size with greater than 50% respiratory variability, suggesting right atrial pressure of 3 mmHg. IAS/Shunts: No atrial level shunt detected by color flow Doppler. Additional Comments: A device lead is visualized.  LEFT VENTRICLE PLAX 2D LVIDd:         6.00 cm LVIDs:         5.10 cm LV PW:         1.00 cm LV IVS:        1.00 cm LVOT  diam:     2.00 cm LV SV:         58 LV SV Index:   28 LVOT Area:     3.14 cm  RIGHT VENTRICLE            IVC RV S prime:     6.90 cm/s  IVC diam: 1.95 cm TAPSE (M-mode): 1.5 cm LEFT ATRIUM              Index        RIGHT ATRIUM           Index LA diam:        4.40 cm  2.12 cm/m   RA Area:     16.40 cm LA Vol (A2C):   122.0 ml 58.83 ml/m  RA Volume:   45.60 ml  21.99 ml/m LA Vol (A4C):   83.8 ml  40.41 ml/m LA Biplane Vol: 105.0 ml 50.63 ml/m  AORTIC VALVE AV Area (Vmax):    2.39 cm AV Area (Vmean):   2.19 cm AV Area (VTI):     2.65 cm AV Vmax:           167.00 cm/s AV Vmean:          110.500 cm/s AV VTI:            0.218 m AV Peak Grad:      11.2 mmHg AV Mean Grad:      6.0 mmHg LVOT Vmax:         127.00 cm/s LVOT Vmean:        77.100 cm/s LVOT VTI:          0.184 m LVOT/AV VTI ratio: 0.84  AORTA Ao Root diam: 3.10 cm Ao Asc diam:  3.00 cm MITRAL VALVE                  TRICUSPID VALVE MV Area (PHT): 3.92 cm       TR Peak grad:   28.9 mmHg MV Area VTI:   2.23 cm       TR Vmax:        269.00 cm/s MV Peak grad:  6.8 mmHg MV Mean grad:  3.5 mmHg       SHUNTS MV Vmax:       1.30 m/s       Systemic VTI:  0.18 m MV Vmean:  88.8 cm/s      Systemic Diam: 2.00 cm MV Decel Time: 194 msec MR Peak grad:    102.8 mmHg MR Mean grad:    57.0 mmHg MR Vmax:         507.00 cm/s MR Vmean:        341.0 cm/s MR PISA:         0.57 cm MR PISA Eff ROA: 4 mm MR PISA Radius:  0.30 cm MV E velocity: 131.00 cm/s Jenkins Rouge MD Electronically signed by Jenkins Rouge MD Signature Date/Time: 06/28/2021/12:17:05 PM    Final     Cardiac Studies   Echocardiogram 06/28/21   1. EF has decreased since echo done 2021 . Left ventricular ejection  fraction, by estimation, is 25 to 30%. The left ventricle has severely  decreased function. The left ventricle demonstrates global hypokinesis.  The left ventricular internal cavity size   was moderately dilated. Left ventricular diastolic parameters are  indeterminate.   2. Pacing wires  in RV/RA. Right ventricular systolic function is normal.  The right ventricular size is normal. There is mildly elevated pulmonary  artery systolic pressure.   3. Left atrial size was moderately dilated.   4. The mitral valve is degenerative. Mild mitral valve regurgitation. No  evidence of mitral stenosis. Moderate mitral annular calcification.   5. Tricuspid valve regurgitation is mild to moderate.   6. Post AVR with 25 mm St Jude trivial closing volume central AR low  gradients normal function . The aortic valve has been repaired/replaced.  Aortic valve regurgitation is trivial. No aortic stenosis is present.  There is a 25 mm St. Jude mechanical  valve present in the aortic position.   7. The inferior vena cava is normal in size with greater than 50%  respiratory variability, suggesting right atrial pressure of 3 mmHg.   Patient Profile     71 y.o. female with a history of normal coronaries on cardiac catheterization in 03/2008, bicuspid aortic valve with aortic stenosis and ascending aortic aneurysm s/p resection/grafting of ascending aortic aneurysm with placement of St. Jude mechanical aortic valve conduit/Bentall procedure in 28/3151, chronic diastolic CHF, persistent atrial fibrillation/flutter, tachy-brady syndrome s/p [employer] PPM implantation in 12/2019, mild bilateral carotid artery stenosis on dopplers in 03/2020, hypertension, hyperlipidemia, type 2 diabetes mellitus, CKD stage III, hypothyroidism, chronic infarcts noted on brain MRI in 2021, multiple falls with prior craniotomy for subdural hematoma/subarachnoid hemorrhage, anemia, depression, bipolar disorder, chronic back pain, obesity, and debilitation (wheelchair bound). She was recently seen in our office on 06/24/2021 with worsening dyspnea and edema after being off her Lasix at SNF. Lasix was restarted at that time but symptoms worsen and she was ultimately admitted on 06/27/2021 with acute on chronic CHF and atrial  fibrillation with RVR.  Assessment & Plan    Acute Hypoxic Respiratory Failure  Acute on Chronic Diastolic CHF Patient presented with worsening dyspnea and edema. Initially required BiPAP in the ED. BNP 854 >> 743. Chest x-ray showed interval increase in mild to moderate interstitial thickening felt to likely represent mild to moderate interstitial pulmonary edema. Last Echo in 03/2020 showed LVEF of 50-55%. She was started on IV Lasix with excellent response. - Echo 06/27/21 with new severe decrease in EF - ? Tachy mediated as her afib has not been well controll -coreg dose increased -start low dose ARB -continue lasix  - would like to avoid right /left heart cath if possible given Dementia and poor functional status   Persistent  Atrial Fibrillation/Flutter  Tachy-Brady Syndrome s/p [employer] PPM  S/p 25 mm St. Jude Mechanical Valve -Her afib is chronic and has been chronic for over 3 years - rate control with increased coreg continue coumadin - Moderate LAE and chronicity suggest not good [facility] candidate  AVR:  mechanical ST jude normal function on echo with no PVL And low mean gradients Normal valve clicks on exam  Hypertension BP well controlled. - Continue Coreg as above.  Hyperlipidemia - Continue home Crestor 40mg  daily.  CKD Stage III Baseline creatinine around 1.1 to 1.3. - Stable at  1.4   Hypokalemia Potassium 4.3 today   Otherwise, per primary team: - Type 2 diabetes mellitus - Hypothyroidism  - Chronic anemia - Depression/bipolar disorder - Chronic back pain  For questions or updates, please contact [facility] Please consult [email or web address] for contact info under      Signed, Jenkins Rouge, MD  06/29/2021, 10:19 AM

## 2021-06-29 NOTE — Progress Notes (Signed)
ANTICOAGULATION CONSULT NOTE  Pharmacy Consult for warfarin Indication: atrial fibrillation  Allergies  Allergen Reactions   Mucinex [Guaifenesin Er] Other (See Comments)    Severe headaches    Keflex [Cephalexin] Other (See Comments)    Headaches and dizziness   Keflex [Cephalexin] Other (See Comments)    Headaches and/or dizziness- "allergic," per [facility]   Mucinex [Guaifenesin Er] Other (See Comments)    Severe headaches- "allergic," per [facility]   Sulfa Antibiotics Other (See Comments)    headaches   Sulfa Antibiotics Other (See Comments)    Headaches- "allergic," per [facility]    Sulfonamide Derivatives Other (See Comments)    Headaches     Patient Measurements: Height: 5\' 8"  (172.7 cm) Weight: 91.6 kg (202 lb) IBW/kg (Calculated) : 63.9 Heparin Dosing Weight: 84.9 kg   Vital Signs: Temp: 97.8 F (36.6 C) (02/04 0510) Temp Source: Oral (02/04 0510) BP: 122/63 (02/04 0930) Pulse Rate: 86 (02/04 0510)  Labs: Recent Labs    06/27/21 1443 06/27/21 1503 06/27/21 1504 06/27/21 1835 06/28/21 0337 06/29/21 0242  HGB 10.6*   < > 11.6*  --  10.3* 11.3*  HCT 33.1*   < > 34.0*  --  32.1* 35.5*  PLT 282  --   --   --  250 322  LABPROT  --   --   --  28.7* 29.3* 28.6*  INR  --   --   --  2.7* 2.8* 2.7*  CREATININE 1.11*  --  1.10*  --  1.21* 1.40*  TROPONINIHS 54*  --   --   --   --   --    < > = values in this interval not displayed.     Estimated Creatinine Clearance: 44.3 mL/min (A) (by C-G formula based on SCr of 1.4 mg/dL (H)).   Medical History: Past Medical History:  Diagnosis Date   ANEMIA 05/28/2010   AORTIC VALVE REPLACEMENT, HX OF 03/11/2010   Mechanical prosthesis   Ascending aortic aneurysm    Atrial fibrillation (HCC)    Atrial flutter ([place])    BIPOLAR AFFECTIVE DISORDER 03/11/2010   Bradycardia    Chronic diastolic CHF (congestive heart failure) (HCC)    Depression    Diabetes ([place])    Diverticulitis 12/18/2010   Falls    FIBROIDS, UTERUS  03/11/2010   Gout 09/04/2016   Grave's disease 12/2010   Hyperlipidemia associated with type 2 diabetes mellitus ([place]) 06/05/2016   Hypertension    Low back pain 11/02/2017   Migraine 06/05/2016   Mixed hyperlipidemia 03/11/2010   Obesity    Pacemaker    Prolonged QT interval    SAH (subarachnoid hemorrhage) (HCC)    Skin cancer    Subdural hematoma    Syncope 08/22/2019   UTI (lower urinary tract infection) 08/14/2011    Medications:  Medications Prior to Admission  Medication Sig Dispense Refill Last Dose   acetaminophen (TYLENOL) 325 MG tablet Take 325 mg by mouth every 4 (four) hours as needed for mild pain.   06/27/2021   albuterol (VENTOLIN HFA) 108 (90 Base) MCG/ACT inhaler Inhale 2 puffs into the lungs at bedtime.   06/26/2021   carvedilol (COREG) 6.25 MG tablet Take 1 tablet (6.25 mg total) by mouth 2 (two) times daily. (Patient taking differently: Take 6.25 mg by mouth 2 (two) times daily. Do NOT give if HR <65 bpm. Check HR prior to admin) 180 tablet 3 06/27/2021 at 0830   fenofibrate 160 MG tablet Take 1 tablet (160 mg  total) by mouth daily.   06/27/2021   fexofenadine (ALLEGRA) 60 MG tablet Take 60 mg by mouth daily.   06/27/2021   furosemide (LASIX) 40 MG tablet Take 1 tablet (40 mg total) by mouth daily. 90 tablet 3 06/27/2021   lamoTRIgine (LAMICTAL) 100 MG tablet Take 100 mg by mouth daily.   06/27/2021   lamoTRIgine (LAMICTAL) 200 MG tablet Take 200 mg by mouth at bedtime.   06/26/2021   levothyroxine (SYNTHROID) 25 MCG tablet TAKE 1 TABLET BY MOUTH DAILY BEFORE BREAKFAST. (Patient taking differently: Take 25 mcg by mouth daily before breakfast.) 90 tablet 0 06/27/2021   Melatonin 10 MG TABS Take 10 mg by mouth at bedtime.   06/26/2021   Miconazole POWD Apply 1 application topically 4 (four) times daily as needed (vaginal itching and odor). Patient may keep at bedside   unk   pantoprazole (PROTONIX) 40 MG tablet Take 1 tablet (40 mg total) by mouth daily.   06/27/2021   rosuvastatin  (CRESTOR) 40 MG tablet Take 40 mg by mouth daily.   06/26/2021   traZODone (DESYREL) 50 MG tablet Take 100 mg by mouth at bedtime.   06/26/2021   venlafaxine XR (EFFEXOR XR) 75 MG 24 hr capsule Take 1 capsule (75 mg total) by mouth daily with breakfast. 90 capsule 1 06/27/2021   warfarin (COUMADIN) 1 MG tablet Take 1 tablet by mouth daily. Give with 2.5 mg tab. (Total 3.5 mg daily)   06/25/2021   warfarin (COUMADIN) 2.5 MG tablet Take 2.5 mg by mouth daily. Give with 1 mg tab = 3.5 mg total   06/25/2021   warfarin (COUMADIN) 4 MG tablet Take 4 mg by mouth daily. Use as directed (Patient not taking: Reported on 06/27/2021)   Not Taking    Assessment: 40 YOF with h/o recurrent AF and h/o mechanical AV valve replacement on warfarin at facility. Pharmacy consulted to resume warfarin. Per facility records, patient's last dose of warfarin was on 1/31  INR therapeutic this morning at 2.7, CBC stable.   Patient's records suggest he was on 4 mg daily but dose was recently reduced to 3.5 mg daily.   Goal of Therapy:  INR 2.5-3.5  Monitor platelets by anticoagulation protocol: Yes   Plan:  -Warfarin 3.5 mg x 1 dose tonight  -Monitor daily PT/INR -Monitor for s/s of bleeding   Thank you for including pharmacy in the care of this patient.  Zenaida Deed, PharmD PGY1 Acute Care Pharmacy Resident  Phone: [phone] 06/29/2021  11:07 AM  Please check [email or web address] for unit-specific pharmacy phone numbers.

## 2021-06-30 DIAGNOSIS — J9601 Acute respiratory failure with hypoxia: Secondary | ICD-10-CM | POA: Diagnosis not present

## 2021-06-30 DIAGNOSIS — I5043 Acute on chronic combined systolic (congestive) and diastolic (congestive) heart failure: Secondary | ICD-10-CM | POA: Diagnosis not present

## 2021-06-30 DIAGNOSIS — Z950 Presence of cardiac pacemaker: Secondary | ICD-10-CM | POA: Diagnosis not present

## 2021-06-30 DIAGNOSIS — I4891 Unspecified atrial fibrillation: Secondary | ICD-10-CM | POA: Diagnosis not present

## 2021-06-30 LAB — CBC WITH DIFFERENTIAL/PLATELET
Abs Immature Granulocytes: 0.02 10*3/uL (ref 0.00–0.07)
Basophils Absolute: 0.1 10*3/uL (ref 0.0–0.1)
Basophils Relative: 2 %
Eosinophils Absolute: 0.2 10*3/uL (ref 0.0–0.5)
Eosinophils Relative: 4 %
HCT: 36 % (ref 36.0–46.0)
Hemoglobin: 11.5 g/dL — ABNORMAL LOW (ref 12.0–15.0)
Immature Granulocytes: 0 %
Lymphocytes Relative: 30 %
Lymphs Abs: 2 10*3/uL (ref 0.7–4.0)
MCH: 29.9 pg (ref 26.0–34.0)
MCHC: 31.9 g/dL (ref 30.0–36.0)
MCV: 93.5 fL (ref 80.0–100.0)
Monocytes Absolute: 0.8 10*3/uL (ref 0.1–1.0)
Monocytes Relative: 12 %
Neutro Abs: 3.4 10*3/uL (ref 1.7–7.7)
Neutrophils Relative %: 52 %
Platelets: 309 10*3/uL (ref 150–400)
RBC: 3.85 MIL/uL — ABNORMAL LOW (ref 3.87–5.11)
RDW: 14.9 % (ref 11.5–15.5)
WBC: 6.5 10*3/uL (ref 4.0–10.5)
nRBC: 0 % (ref 0.0–0.2)

## 2021-06-30 LAB — COMPREHENSIVE METABOLIC PANEL
ALT: 10 U/L (ref 0–44)
AST: 18 U/L (ref 15–41)
Albumin: 3.7 g/dL (ref 3.5–5.0)
Alkaline Phosphatase: 32 U/L — ABNORMAL LOW (ref 38–126)
Anion gap: 10 (ref 5–15)
BUN: 40 mg/dL — ABNORMAL HIGH (ref 8–23)
CO2: 27 mmol/L (ref 22–32)
Calcium: 10.3 mg/dL (ref 8.9–10.3)
Chloride: 103 mmol/L (ref 98–111)
Creatinine, Ser: 1.52 mg/dL — ABNORMAL HIGH (ref 0.44–1.00)
GFR, Estimated: 37 mL/min — ABNORMAL LOW (ref >60)
Glucose, Bld: 146 mg/dL — ABNORMAL HIGH (ref 70–99)
Potassium: 3.9 mmol/L (ref 3.5–5.1)
Sodium: 140 mmol/L (ref 135–145)
Total Bilirubin: 0.6 mg/dL (ref 0.3–1.2)
Total Protein: 6.8 g/dL (ref 6.5–8.1)

## 2021-06-30 LAB — MAGNESIUM: Magnesium: 2 mg/dL (ref 1.7–2.4)

## 2021-06-30 LAB — PROTIME-INR
INR: 2.8 — ABNORMAL HIGH (ref 0.8–1.2)
Prothrombin Time: 29.4 seconds — ABNORMAL HIGH (ref 11.4–15.2)

## 2021-06-30 LAB — BRAIN NATRIURETIC PEPTIDE: B Natriuretic Peptide: 263 pg/mL — ABNORMAL HIGH (ref 0.0–100.0)

## 2021-06-30 MED ORDER — WARFARIN SODIUM 2.5 MG PO TABS
3.5000 mg | ORAL_TABLET | Freq: Once | ORAL | Status: AC
  Administered 2021-06-30: 3.5 mg via ORAL
  Filled 2021-06-30: qty 1

## 2021-06-30 NOTE — Progress Notes (Signed)
Progress Note  Patient Name: [name] Date of Encounter: 06/30/2021  [facility] Cardiologist: Sinclair Grooms, MD   Subjective   Looks mor spry Sitting in chair sats ok Still complains of exertional dyspnea   Inpatient Medications    Scheduled Meds:  carvedilol  6.25 mg Oral BID WC   cholecalciferol  1,000 Units Oral Daily   docusate sodium  100 mg Oral BID   fenofibrate  160 mg Oral Daily   lamoTRIgine  100 mg Oral Daily   lamoTRIgine  200 mg Oral QHS   levothyroxine  25 mcg Oral QAC breakfast   losartan  25 mg Oral Daily   pantoprazole  40 mg Oral Daily   rosuvastatin  40 mg Oral Daily   sodium chloride flush  3 mL Intravenous Q12H   cyanocobalamin  1,000 mcg Oral Daily   Warfarin - Pharmacist Dosing Inpatient   Does not apply q1600   Continuous Infusions:   PRN Meds: acetaminophen **OR** acetaminophen, albuterol, bisacodyl, hydrALAZINE, melatonin, ondansetron **OR** ondansetron (ZOFRAN) IV, SUMAtriptan   Vital Signs    Vitals:   06/29/21 0930 06/29/21 1400 06/29/21 2028 06/30/21 0400  BP: 122/63 119/68 (!) 94/46 105/81  Pulse:  89 82 71  Resp:  18 15 (!) 21  Temp:  98 F (36.7 C) (!) 97.3 F (36.3 C) 98.3 F (36.8 C)  TempSrc:  Oral Oral Oral  SpO2:  98% 97% 97%  Weight:    92.4 kg  Height:       No intake or output data in the 24 hours ending 06/30/21 0919  Last 3 Weights 06/30/2021 06/29/2021 06/28/2021  Weight (lbs) 203 lb 12.8 oz 202 lb 207 lb  Weight (kg) 92.443 kg 91.627 kg 93.895 kg      Telemetry    Afib rates 80;s occasional escape pacing   ECG    Afib rate 104 nonspecific ST changes   Physical Exam   Some dementia [name] female looks older than stated age  71: normal Neck supple with no adenopathy JVP normal no bruits no thyromegaly Lungs clear with no wheezing and good diaphragmatic motion Heart:  A1/P3 click SEM  no  AR murmur, no rub, gallop or click PMI normal  PPM under left clavicle  Abdomen: benighn, BS  positve, no tenderness, no AAA no bruit.  No HSM or HJR Distal pulses intact with no bruits No edema Neuro non-focal Skin warm and dry No muscular weakness   Labs    High Sensitivity Troponin:   Recent Labs  Lab 06/27/21 1443  TROPONINIHS 54*     Chemistry Recent Labs  Lab 06/28/21 0337 06/29/21 0242 06/30/21 0248  NA 138 141 140  K 3.4* 4.3 3.9  CL 101 100 103  CO2 28 29 27   GLUCOSE 110* 143* 146*  BUN 20 31* 40*  CREATININE 1.21* 1.40* 1.52*  CALCIUM 8.7* 10.3 10.3  MG 1.8 2.1 2.0  PROT 6.5 7.3 6.8  ALBUMIN 3.6 4.0 3.7  AST 18 18 18   ALT 12 11 10   ALKPHOS 26* 33* 32*  BILITOT 1.0 0.8 0.6  GFRNONAA 48* 40* 37*  ANIONGAP 9 12 10     Lipids No results for input(s): CHOL, TRIG, HDL, LABVLDL, LDLCALC, CHOLHDL in the last 168 hours.  Hematology Recent Labs  Lab 06/28/21 0337 06/29/21 0242 06/30/21 0248  WBC 7.4 7.7 6.5  RBC 3.47* 3.81* 3.85*  HGB 10.3* 11.3* 11.5*  HCT 32.1* 35.5* 36.0  MCV 92.5 93.2 93.5  MCH 29.7 29.7 29.9  MCHC 32.1 31.8 31.9  RDW 15.3 15.3 14.9  PLT 250 322 309   Thyroid  Recent Labs  Lab 06/27/21 1835  TSH 2.578    BNP Recent Labs  Lab 06/24/21 1139 06/27/21 1446 06/28/21 0337 06/29/21 0242 06/30/21 0248  BNP  --    < > 743.6* 432.2* 263.0*  PROBNP 6,337*  --   --   --   --    < > = values in this interval not displayed.    DDimer No results for input(s): DDIMER in the last 168 hours.   Radiology    ECHOCARDIOGRAM COMPLETE  Result Date: 06/28/2021    ECHOCARDIOGRAM REPORT   Patient Name:   SANAII CAPORASO Date of Exam: 06/28/2021 Medical Rec #:  [id number]     Height:       68.0 in Accession #:    [id number]    Weight:       207.0 lb Date of Birth:  02/17/1951      BSA:          2.074 m Patient Age:    66 years      BP:           108/75 mmHg Patient Gender: F             HR:           102 bpm. Exam Location:  Inpatient Procedure: 2D Echo, 3D Echo, Intracardiac Opacification Agent, Cardiac Doppler            and Color Doppler  Indications:    I50.40* Unspecified combined systolic (congestive) and diastolic                 (congestive) heart failure  History:        Patient has prior history of Echocardiogram examinations, most                 recent 03/28/2020. Abnormal ECG and Pacemaker, Aortic Valve                 Disease, Arrythmias:Tachycardia, Bradycardia and Atrial                 Fibrillation, Signs/Symptoms:Syncope and Altered Mental Status;                 Risk Factors:Dyslipidemia and Hypertension.                 Aortic Valve: 25 mm St. Jude mechanical valve is present in the                 aortic position.  Sonographer:    Roseanna Rainbow RDCS Referring Phys: [id number] Margaree Mackintosh [facility]  Sonographer Comments: Technically difficult study due to poor echo windows. Image acquisition challenging due to patient body habitus. IMPRESSIONS  1. EF has decreased since echo done 2021 . Left ventricular ejection fraction, by estimation, is 25 to 30%. The left ventricle has severely decreased function. The left ventricle demonstrates global hypokinesis. The left ventricular internal cavity size  was moderately dilated. Left ventricular diastolic parameters are indeterminate.  2. Pacing wires in RV/RA. Right ventricular systolic function is normal. The right ventricular size is normal. There is mildly elevated pulmonary artery systolic pressure.  3. Left atrial size was moderately dilated.  4. The mitral valve is degenerative. Mild mitral valve regurgitation. No evidence of mitral stenosis. Moderate mitral annular calcification.  5. Tricuspid valve regurgitation is mild to moderate.  6.  Post AVR with 25 mm St Jude trivial closing volume central AR low gradients normal function . The aortic valve has been repaired/replaced. Aortic valve regurgitation is trivial. No aortic stenosis is present. There is a 25 mm St. Jude mechanical valve present in the aortic position.  7. The inferior vena cava is normal in size with greater than 50% respiratory  variability, suggesting right atrial pressure of 3 mmHg. FINDINGS  Left Ventricle: EF has decreased since echo done 2021. Left ventricular ejection fraction, by estimation, is 25 to 30%. The left ventricle has severely decreased function. The left ventricle demonstrates global hypokinesis. Definity contrast agent was given IV to delineate the left ventricular endocardial borders. The left ventricular internal cavity size was moderately dilated. There is no left ventricular hypertrophy. Left ventricular diastolic parameters are indeterminate. Right Ventricle: Pacing wires in RV/RA. The right ventricular size is normal. No increase in right ventricular wall thickness. Right ventricular systolic function is normal. There is mildly elevated pulmonary artery systolic pressure. The tricuspid regurgitant velocity is 2.69 m/s, and with an assumed right atrial pressure of 8 mmHg, the estimated right ventricular systolic pressure is 60.1 mmHg. Left Atrium: Left atrial size was moderately dilated. Right Atrium: Right atrial size was normal in size. Pericardium: There is no evidence of pericardial effusion. Mitral Valve: The mitral valve is degenerative in appearance. There is moderate thickening of the mitral valve leaflet(s). There is moderate calcification of the mitral valve leaflet(s). Moderate mitral annular calcification. Mild mitral valve regurgitation. No evidence of mitral valve stenosis. MV peak gradient, 6.8 mmHg. The mean mitral valve gradient is 3.5 mmHg. Tricuspid Valve: The tricuspid valve is normal in structure. Tricuspid valve regurgitation is mild to moderate. No evidence of tricuspid stenosis. Aortic Valve: Post AVR with 25 mm St Jude trivial closing volume central AR low gradients normal function. The aortic valve has been repaired/replaced. Aortic valve regurgitation is trivial. No aortic stenosis is present. Aortic valve mean gradient measures 6.0 mmHg. Aortic valve peak gradient measures 11.2 mmHg.  Aortic valve area, by VTI measures 2.65 cm. There is a 25 mm St. Jude mechanical valve present in the aortic position. Pulmonic Valve: The pulmonic valve was normal in structure. Pulmonic valve regurgitation is not visualized. No evidence of pulmonic stenosis. Aorta: The aortic root is normal in size and structure. Venous: The inferior vena cava is normal in size with greater than 50% respiratory variability, suggesting right atrial pressure of 3 mmHg. IAS/Shunts: No atrial level shunt detected by color flow Doppler. Additional Comments: A device lead is visualized.  LEFT VENTRICLE PLAX 2D LVIDd:         6.00 cm LVIDs:         5.10 cm LV PW:         1.00 cm LV IVS:        1.00 cm LVOT diam:     2.00 cm LV SV:         58 LV SV Index:   28 LVOT Area:     3.14 cm  RIGHT VENTRICLE            IVC RV S prime:     6.90 cm/s  IVC diam: 1.95 cm TAPSE (M-mode): 1.5 cm LEFT ATRIUM              Index        RIGHT ATRIUM           Index LA diam:        4.40  cm  2.12 cm/m   RA Area:     16.40 cm LA Vol (A2C):   122.0 ml 58.83 ml/m  RA Volume:   45.60 ml  21.99 ml/m LA Vol (A4C):   83.8 ml  40.41 ml/m LA Biplane Vol: 105.0 ml 50.63 ml/m  AORTIC VALVE AV Area (Vmax):    2.39 cm AV Area (Vmean):   2.19 cm AV Area (VTI):     2.65 cm AV Vmax:           167.00 cm/s AV Vmean:          110.500 cm/s AV VTI:            0.218 m AV Peak Grad:      11.2 mmHg AV Mean Grad:      6.0 mmHg LVOT Vmax:         127.00 cm/s LVOT Vmean:        77.100 cm/s LVOT VTI:          0.184 m LVOT/AV VTI ratio: 0.84  AORTA Ao Root diam: 3.10 cm Ao Asc diam:  3.00 cm MITRAL VALVE                  TRICUSPID VALVE MV Area (PHT): 3.92 cm       TR Peak grad:   28.9 mmHg MV Area VTI:   2.23 cm       TR Vmax:        269.00 cm/s MV Peak grad:  6.8 mmHg MV Mean grad:  3.5 mmHg       SHUNTS MV Vmax:       1.30 m/s       Systemic VTI:  0.18 m MV Vmean:      88.8 cm/s      Systemic Diam: 2.00 cm MV Decel Time: 194 msec MR Peak grad:    102.8 mmHg MR Mean grad:     57.0 mmHg MR Vmax:         507.00 cm/s MR Vmean:        341.0 cm/s MR PISA:         0.57 cm MR PISA Eff ROA: 4 mm MR PISA Radius:  0.30 cm MV E velocity: 131.00 cm/s Jenkins Rouge MD Electronically signed by Jenkins Rouge MD Signature Date/Time: 06/28/2021/12:17:05 PM    Final     Cardiac Studies   Echocardiogram 06/28/21   1. EF has decreased since echo done 2021 . Left ventricular ejection  fraction, by estimation, is 25 to 30%. The left ventricle has severely  decreased function. The left ventricle demonstrates global hypokinesis.  The left ventricular internal cavity size   was moderately dilated. Left ventricular diastolic parameters are  indeterminate.   2. Pacing wires in RV/RA. Right ventricular systolic function is normal.  The right ventricular size is normal. There is mildly elevated pulmonary  artery systolic pressure.   3. Left atrial size was moderately dilated.   4. The mitral valve is degenerative. Mild mitral valve regurgitation. No  evidence of mitral stenosis. Moderate mitral annular calcification.   5. Tricuspid valve regurgitation is mild to moderate.   6. Post AVR with 25 mm St Jude trivial closing volume central AR low  gradients normal function . The aortic valve has been repaired/replaced.  Aortic valve regurgitation is trivial. No aortic stenosis is present.  There is a 25 mm St. Jude mechanical  valve present in the aortic position.   7. The inferior vena cava  is normal in size with greater than 50%  respiratory variability, suggesting right atrial pressure of 3 mmHg.   Patient Profile     71 y.o. female with a history of normal coronaries on cardiac catheterization in 03/2008, bicuspid aortic valve with aortic stenosis and ascending aortic aneurysm s/p resection/grafting of ascending aortic aneurysm with placement of St. Jude mechanical aortic valve conduit/Bentall procedure in 16/0737, chronic diastolic CHF, persistent atrial fibrillation/flutter, tachy-brady  syndrome s/p [employer] PPM implantation in 12/2019, mild bilateral carotid artery stenosis on dopplers in 03/2020, hypertension, hyperlipidemia, type 2 diabetes mellitus, CKD stage III, hypothyroidism, chronic infarcts noted on brain MRI in 2021, multiple falls with prior craniotomy for subdural hematoma/subarachnoid hemorrhage, anemia, depression, bipolar disorder, chronic back pain, obesity, and debilitation (wheelchair bound). She was recently seen in our office on 06/24/2021 with worsening dyspnea and edema after being off her Lasix at SNF. Lasix was restarted at that time but symptoms worsen and she was ultimately admitted on 06/27/2021 with acute on chronic CHF and atrial fibrillation with RVR.  Assessment & Plan    Acute Hypoxic Respiratory Failure  Acute on Chronic Diastolic CHF Patient presented with worsening dyspnea and edema. Initially required BiPAP in the ED. BNP 854 >> 743. Chest x-ray showed interval increase in mild to moderate interstitial thickening felt to likely represent mild to moderate interstitial pulmonary edema. Last Echo in 03/2020 showed LVEF of 50-55%. She was started on IV Lasix with excellent response. - Echo 06/27/21 with new severe decrease in EF - ? Tachy mediated as her afib has not been well controll -coreg dose increased -Low dose ARB -Lasix held with Cr 1.5 BNP much better  - would like to avoid right /left heart cath if possible given Dementia and poor functional status   Persistent Atrial Fibrillation/Flutter  Tachy-Brady Syndrome s/p [employer] PPM  S/p 25 mm St. Jude Mechanical Valve -Her afib is chronic and has been chronic for over 3 years - rate control with increased coreg 6.25 bid continue coumadin - Moderate LAE and chronicity suggest not good [place] candidate  AVR:  mechanical ST jude normal function on echo with no PVL And low mean gradients Normal valve clicks on exam INR Rx today 2.8   Hypertension BP well controlled. - Continue  Coreg as above.  Hyperlipidemia - Continue home Crestor 40mg  daily.  CKD Stage III Baseline creatinine1.5  - resume lasix at lower dose 20 mg daily starting tomorrow - will follow given addition of ARB   Hypokalemia Potassium 3.9   Otherwise, per primary team: - Type 2 diabetes mellitus - Hypothyroidism  - Chronic anemia - Depression/bipolar disorder - Chronic back pain  Pacemaker:  patient thought for some reason her PPM was turned off PACEART review from 05/15/21 showed normal function She has a single lead device due to chronic afib   For questions or updates, please contact [facility] Please consult [email or web address] for contact info under      Signed, Jenkins Rouge, MD  06/30/2021, 9:19 AM

## 2021-06-30 NOTE — Progress Notes (Addendum)
ANTICOAGULATION CONSULT NOTE  Pharmacy Consult for warfarin Indication: atrial fibrillation  Allergies  Allergen Reactions   Mucinex [Guaifenesin Er] Other (See Comments)    Severe headaches    Keflex [Cephalexin] Other (See Comments)    Headaches and dizziness   Keflex [Cephalexin] Other (See Comments)    Headaches and/or dizziness- "allergic," per [facility]   Mucinex [Guaifenesin Er] Other (See Comments)    Severe headaches- "allergic," per [facility]   Sulfa Antibiotics Other (See Comments)    headaches   Sulfa Antibiotics Other (See Comments)    Headaches- "allergic," per [facility]    Sulfonamide Derivatives Other (See Comments)    Headaches     Patient Measurements: Height: 5\' 8"  (172.7 cm) Weight: 92.4 kg (203 lb 12.8 oz) IBW/kg (Calculated) : 63.9 Heparin Dosing Weight: 84.9 kg   Vital Signs: Temp: 98.4 F (36.9 C) (02/05 0814) Temp Source: Oral (02/05 0814) BP: 125/82 (02/05 0814) Pulse Rate: 95 (02/05 0814)  Labs: Recent Labs    06/27/21 1443 06/27/21 1503 06/28/21 0337 06/29/21 0242 06/30/21 0248  HGB 10.6*   < > 10.3* 11.3* 11.5*  HCT 33.1*   < > 32.1* 35.5* 36.0  PLT 282  --  250 322 309  LABPROT  --    < > 29.3* 28.6* 29.4*  INR  --    < > 2.8* 2.7* 2.8*  CREATININE 1.11*   < > 1.21* 1.40* 1.52*  TROPONINIHS 54*  --   --   --   --    < > = values in this interval not displayed.     Estimated Creatinine Clearance: 40.9 mL/min (A) (by C-G formula based on SCr of 1.52 mg/dL (H)).   Medical History: Past Medical History:  Diagnosis Date   ANEMIA 05/28/2010   AORTIC VALVE REPLACEMENT, HX OF 03/11/2010   Mechanical prosthesis   Ascending aortic aneurysm    Atrial fibrillation (HCC)    Atrial flutter ([place])    BIPOLAR AFFECTIVE DISORDER 03/11/2010   Bradycardia    Chronic diastolic CHF (congestive heart failure) (HCC)    Depression    Diabetes ([place])    Diverticulitis 12/18/2010   Falls    FIBROIDS, UTERUS 03/11/2010   Gout 09/04/2016   Grave's disease  12/2010   Hyperlipidemia associated with type 2 diabetes mellitus ([place]) 06/05/2016   Hypertension    Low back pain 11/02/2017   Migraine 06/05/2016   Mixed hyperlipidemia 03/11/2010   Obesity    Pacemaker    Prolonged QT interval    SAH (subarachnoid hemorrhage) ([place])    Skin cancer    Subdural hematoma    Syncope 08/22/2019   UTI (lower urinary tract infection) 08/14/2011    Assessment: [name] with h/o recurrent AF and h/o mechanical AV valve replacement on warfarin at facility. Pharmacy consulted to resume warfarin. Per facility records, patient's last dose of warfarin was on 1/31.  Warfarin regimen PTA: 3.5 mg daily (recently reduced from 4mg  daily), total weekly dose 24.5mg    2/5 INR: 2.8, therapeutic  CBC stable, no overt s/s of bleeding. 100% of meal eaten.   Goal of Therapy:  INR 2.5-3.5  Monitor platelets by anticoagulation protocol: Yes   Plan:  -Warfarin 3.5 mg x 1 dose tonight  -Monitor daily PT/INR -Monitor for s/s of bleeding   Thank you for allowing pharmacy to participate in this patient's care.  Levonne Spiller, PharmD PGY1 Acute Care Resident  06/30/2021,1:23 PM

## 2021-06-30 NOTE — Progress Notes (Signed)
PROGRESS NOTE                                                                                                                                                                                                             Patient Demographics:    [name], is a 71 y.o. female, DOB - 07-05-1950, CBJ:[id number]  Outpatient Primary MD for the patient is Mosie Lukes, MD    LOS - 3  Admit date - 06/27/2021    Chief Complaint  Patient presents with   Shortness of Breath            Brief Narrative (HPI from H&P)  71 y.o. female, with history of mechanical Saint Jude aortic valve placed in 2011, paroxysmal atrial fibrillation [place] vas 2 score of greater than 5 with embolic strokes, multiple falls with subdural bleed requiring craniotomy, DM type II, bipolar disorder, migraine, hypertension, syncope, tachybradycardia syndrome s/p pacemaker placement, dyslipidemia, who lives in a nursing home and presented with shortness of breath and was admitted with a diagnosis of CHF.   Subjective:   Patient in bed, appears comfortable, denies any headache, no fever, no chest pain or pressure, no shortness of breath , no abdominal pain. No new focal weakness.   Assessment  & Plan :    Assessment and Plan: No notes have been filed under this hospital service. Service: Hospitalist    1.  Acute hypoxic respiratory failure brought on by acute on chronic systolic and diastolic heart failure EF now percent down from 50% few years ago.  She also dropped a few doses of Lasix at SNF due to some miscommunication, no chest pain or acute EKG changes.  She has responded very well to IV Lasix with 3-1/2 to 4 L of diuresis in the first 24 hours, shortness of breath much improved from BiPAP she is down to room air and exam shows little to no fluid overload on exam on 06/30/2021.  Lasix held today due to bump in creatinine.  Case discussed with the  cardiologist on 06/29/2021 plan is to continue Coreg, cardiology has added low-dose Cozaar and will initiate low-dose Lasix on 07/01/2021 and monitor her clinical response.  For now refraining from left heart cath.  Monitor with optimization of her medical regimen.  Drop in EF could be due to uncontrolled A. fib.  2.  Paroxysmal atrial fibrillation with [place] vas 2 score of greater than 5 with history of embolic strokes.  Coreg and Coumadin combination to be continued, pharmacy monitoring INR.   3.  History of embolic strokes due to A. fib with multiple falls and subdural hematoma and bleeds.  Supportive care, PT OT high fall risk.  Post discharge back to SNF.   4.  Dyslipidemia.  Continue home dose statin   5.  Hypothyroidism.  Continue home dose Synthroid.   6.  Hypertension.  Continue Coreg and diuresis with Lasix and monitor.   7.  GERD.  On PPI continue.   8.  CKD 3A.  Baseline creatinine between baseline of 1.2 and 1.4.  Monitor with addition of Cozaar.  Diuretics per cardiology.   9.  History of mechanical Saint Jude aortic valve placed in 2011.  Continue with supportive care.  She is on Coumadin but most likely due to A. fib with embolic strokes.   10.  History of migraine headaches.  Monitor with supportive care.   11.  History of delirium.  She seems to be on extremely high doses of Seroquel, will have pharmacy reconcile her home medications and then address.  Will be at risk for encephalopathy in the hospital setting.  Will follow fall precautions and aspiration precautions while here, minimize narcotics and benzodiazepine use.  12.  Hypokalemia.  Replaced.       Condition - Fair  Family Communication  :  Daughter Amy [phone] 06/27/21, 06/28/21, 06/29/2021  Code Status :  Full  Consults  :  Cards  PUD Prophylaxis :    Procedures  :     TTE - 1. EF has decreased since echo done 2021 . Left ventricular ejection fraction, by estimation, is 25 to 30%. The left ventricle has  severely decreased function. The left ventricle demonstrates global hypokinesis. The left ventricular internal cavity size  was moderately dilated. Left ventricular diastolic parameters are indeterminate.  2. Pacing wires in RV/RA. Right ventricular systolic function is normal. The right ventricular size is normal. There is mildly elevated pulmonary artery systolic pressure.  3. Left atrial size was moderately dilated.  4. The mitral valve is degenerative. Mild mitral valve regurgitation. No evidence of mitral stenosis. Moderate mitral annular calcification.  5. Tricuspid valve regurgitation is mild to moderate.  6. Post AVR with 25 mm St Jude trivial closing volume central AR low gradients normal function . The aortic valve has been repaired/replaced. Aortic valve regurgitation is trivial. No aortic stenosis is present. There is a 25 mm St. Jude mechanical valve present in the aortic position.  7. The inferior vena cava is normal in size with greater than 50% respiratory variability, suggesting right atrial pressure of 3 mmHg.      Disposition Plan  :    Status is: Inpatient - CHF  DVT Prophylaxis  :    Place and maintain sequential compression device Start: 06/27/21 1645    Lab Results  Component Value Date   PLT 309 06/30/2021    Diet :  Diet Order             DIET SOFT Room service appropriate? Yes; Fluid consistency: Thin; Fluid restriction: 1500 mL Fluid  Diet effective now                    Inpatient Medications  Scheduled Meds:  carvedilol  6.25 mg Oral BID WC   cholecalciferol  1,000 Units Oral Daily  docusate sodium  100 mg Oral BID   fenofibrate  160 mg Oral Daily   lamoTRIgine  100 mg Oral Daily   lamoTRIgine  200 mg Oral QHS   levothyroxine  25 mcg Oral QAC breakfast   losartan  25 mg Oral Daily   pantoprazole  40 mg Oral Daily   rosuvastatin  40 mg Oral Daily   sodium chloride flush  3 mL Intravenous Q12H   cyanocobalamin  1,000 mcg Oral Daily   Warfarin  - Pharmacist Dosing Inpatient   Does not apply q1600   Continuous Infusions: PRN Meds:.acetaminophen **OR** acetaminophen, albuterol, bisacodyl, hydrALAZINE, melatonin, ondansetron **OR** ondansetron (ZOFRAN) IV, SUMAtriptan  Antibiotics  :    Anti-infectives (From admission, onward)    None        Time Spent in minutes  30   Lala Lund M.D on 06/30/2021 at 10:02 AM  To page go to [email or web address]   [facility] -  Office  [phone]  See all Orders from today for further details    Objective:   Vitals:   06/29/21 1400 06/29/21 2028 06/30/21 0400 06/30/21 0814  BP: 119/68 (!) 94/46 105/81 125/82  Pulse: 89 82 71 95  Resp: 18 15 (!) 21 20  Temp: 98 F (36.7 C) (!) 97.3 F (36.3 C) 98.3 F (36.8 C) 98.4 F (36.9 C)  TempSrc: Oral Oral Oral Oral  SpO2: 98% 97% 97% 97%  Weight:   92.4 kg   Height:        Wt Readings from Last 3 Encounters:  06/30/21 92.4 kg  06/24/21 95.3 kg  01/02/21 100.1 kg     Intake/Output Summary (Last 24 hours) at 06/30/2021 1002 Last data filed at 06/30/2021 0900 Gross per 24 hour  Intake 240 ml  Output --  Net 240 ml     Physical Exam  Awake Alert, No new F.N deficits, Normal affect [place].AT,PERRAL Supple Neck, No JVD,   Symmetrical Chest wall movement, Good air movement bilaterally, CTAB RRR,No Gallops, Rubs or new Murmurs,  +ve B.Sounds, Abd Soft, No tenderness,   No Cyanosis, Clubbing or edema       Data Review:    CBC Recent Labs  Lab 06/27/21 1443 06/27/21 1503 06/27/21 1504 06/28/21 0337 06/29/21 0242 06/30/21 0248  WBC 8.2  --   --  7.4 7.7 6.5  HGB 10.6* 11.6* 11.6* 10.3* 11.3* 11.5*  HCT 33.1* 34.0* 34.0* 32.1* 35.5* 36.0  PLT 282  --   --  250 322 309  MCV 94.0  --   --  92.5 93.2 93.5  MCH 30.1  --   --  29.7 29.7 29.9  MCHC 32.0  --   --  32.1 31.8 31.9  RDW 15.7*  --   --  15.3 15.3 14.9  LYMPHSABS 1.1  --   --  1.8 1.9 2.0  MONOABS 0.4  --   --  0.5 0.8 0.8  EOSABS 0.2  --   --  0.2 0.2  0.2  BASOSABS 0.1  --   --  0.1 0.1 0.1    Electrolytes Recent Labs  Lab 06/24/21 1139 06/24/21 1139 06/27/21 1443 06/27/21 1446 06/27/21 1503 06/27/21 1504 06/27/21 1835 06/28/21 0337 06/29/21 0242 06/30/21 0248  NA 141   < > 138  --  139 140  --  138 141 140  K 3.9  --  3.8  --  3.9 3.9  --  3.4* 4.3 3.9  CL 103  --  103  --   --  104  --  101 100 103  CO2 25  --  24  --   --   --   --  28 29 27   GLUCOSE 116*  --  134*  --   --  130*  --  110* 143* 146*  BUN 13  --  21  --   --  21  --  20 31* 40*  CREATININE 1.14*  --  1.11*  --   --  1.10*  --  1.21* 1.40* 1.52*  CALCIUM 9.3  --  8.9  --   --   --   --  8.7* 10.3 10.3  AST  --   --   --   --   --   --   --  18 18 18   ALT  --   --   --   --   --   --   --  12 11 10   ALKPHOS  --   --   --   --   --   --   --  26* 33* 32*  BILITOT  --   --   --   --   --   --   --  1.0 0.8 0.6  ALBUMIN  --   --   --   --   --   --   --  3.6 4.0 3.7  MG  --   --   --   --   --   --   --  1.8 2.1 2.0  CRP  --   --   --   --   --   --  0.6  --   --   --   INR  --   --   --   --   --   --  2.7* 2.8* 2.7* 2.8*  TSH  --   --   --   --   --   --  2.578  --   --   --   BNP  --   --   --  854.4*  --   --   --  743.6* 432.2* 263.0*   < > = values in this interval not displayed.    ------------------------------------------------------------------------------------------------------------------ No results for input(s): CHOL, HDL, LDLCALC, TRIG, CHOLHDL, LDLDIRECT in the last 72 hours.  Lab Results  Component Value Date   HGBA1C 4.8 07/24/2020    Recent Labs    06/27/21 1835  TSH 2.578   ------------------------------------------------------------------------------------------------------------------ ID Labs Recent Labs  Lab 06/27/21 1443 06/27/21 1504 06/27/21 1835 06/28/21 0337 06/29/21 0242 06/30/21 0248  WBC 8.2  --   --  7.4 7.7 6.5  PLT 282  --   --  250 322 309  CRP  --   --  0.6  --   --   --   CREATININE 1.11* 1.10*  --   1.21* 1.40* 1.52*   Cardiac Enzymes No results for input(s): CKMB, TROPONINI, MYOGLOBIN in the last 168 hours.  Invalid input(s): CK   Radiology Reports DG Chest Port 1 View  Result Date: 06/27/2021 CLINICAL DATA:  Shortness of breath. Progressive worsening over 3 weeks. EXAM: PORTABLE CHEST 1 VIEW COMPARISON:  AP chest views 07/23/2020 FINDINGS: Left chest wall cardiac pacer is again seen with leads overlying the right atrium and right ventricle. Status post median sternotomy. Cardiac silhouette is again moderately enlarged. Cardiac valve prosthesis is again noted overlying the mid heart. Mediastinal contours are  grossly within normal limits. Mild interval increase in mild-to-moderate interstitial thickening bilaterally. Possible tiny bilateral pleural effusions. No pneumothorax. No acute skeletal abnormality. Right axillary surgical clips are again noted. IMPRESSION: Interval increase in mild-to-moderate interstitial thickening likely mild to moderate interstitial pulmonary edema. Stable cardiomegaly. Electronically Signed   By: Yvonne Kendall M.D.   On: 06/27/2021 14:54   ECHOCARDIOGRAM COMPLETE  Result Date: 06/28/2021    ECHOCARDIOGRAM REPORT   Patient Name:   [name] Date of Exam: 06/28/2021 Medical Rec #:  [id number]     Height:       68.0 in Accession #:    [id number]    Weight:       207.0 lb Date of Birth:  1951/05/05      BSA:          2.074 m Patient Age:    29 years      BP:           108/75 mmHg Patient Gender: F             HR:           102 bpm. Exam Location:  Inpatient Procedure: 2D Echo, 3D Echo, Intracardiac Opacification Agent, Cardiac Doppler            and Color Doppler Indications:    I50.40* Unspecified combined systolic (congestive) and diastolic                 (congestive) heart failure  History:        Patient has prior history of Echocardiogram examinations, most                 recent 03/28/2020. Abnormal ECG and Pacemaker, Aortic Valve                 Disease,  Arrythmias:Tachycardia, Bradycardia and Atrial                 Fibrillation, Signs/Symptoms:Syncope and Altered Mental Status;                 Risk Factors:Dyslipidemia and Hypertension.                 Aortic Valve: 25 mm St. Jude mechanical valve is present in the                 aortic position.  Sonographer:    Roseanna Rainbow RDCS Referring Phys: [id number] Margaree Mackintosh [facility]  Sonographer Comments: Technically difficult study due to poor echo windows. Image acquisition challenging due to patient body habitus. IMPRESSIONS  1. EF has decreased since echo done 2021 . Left ventricular ejection fraction, by estimation, is 25 to 30%. The left ventricle has severely decreased function. The left ventricle demonstrates global hypokinesis. The left ventricular internal cavity size  was moderately dilated. Left ventricular diastolic parameters are indeterminate.  2. Pacing wires in RV/RA. Right ventricular systolic function is normal. The right ventricular size is normal. There is mildly elevated pulmonary artery systolic pressure.  3. Left atrial size was moderately dilated.  4. The mitral valve is degenerative. Mild mitral valve regurgitation. No evidence of mitral stenosis. Moderate mitral annular calcification.  5. Tricuspid valve regurgitation is mild to moderate.  6. Post AVR with 25 mm St Jude trivial closing volume central AR low gradients normal function . The aortic valve has been repaired/replaced. Aortic valve regurgitation is trivial. No aortic stenosis is present. There is a 25 mm St. Jude mechanical valve present in the aortic  position.  7. The inferior vena cava is normal in size with greater than 50% respiratory variability, suggesting right atrial pressure of 3 mmHg. FINDINGS  Left Ventricle: EF has decreased since echo done 2021. Left ventricular ejection fraction, by estimation, is 25 to 30%. The left ventricle has severely decreased function. The left ventricle demonstrates global hypokinesis. Definity contrast  agent was given IV to delineate the left ventricular endocardial borders. The left ventricular internal cavity size was moderately dilated. There is no left ventricular hypertrophy. Left ventricular diastolic parameters are indeterminate. Right Ventricle: Pacing wires in RV/RA. The right ventricular size is normal. No increase in right ventricular wall thickness. Right ventricular systolic function is normal. There is mildly elevated pulmonary artery systolic pressure. The tricuspid regurgitant velocity is 2.69 m/s, and with an assumed right atrial pressure of 8 mmHg, the estimated right ventricular systolic pressure is 90.2 mmHg. Left Atrium: Left atrial size was moderately dilated. Right Atrium: Right atrial size was normal in size. Pericardium: There is no evidence of pericardial effusion. Mitral Valve: The mitral valve is degenerative in appearance. There is moderate thickening of the mitral valve leaflet(s). There is moderate calcification of the mitral valve leaflet(s). Moderate mitral annular calcification. Mild mitral valve regurgitation. No evidence of mitral valve stenosis. MV peak gradient, 6.8 mmHg. The mean mitral valve gradient is 3.5 mmHg. Tricuspid Valve: The tricuspid valve is normal in structure. Tricuspid valve regurgitation is mild to moderate. No evidence of tricuspid stenosis. Aortic Valve: Post AVR with 25 mm St Jude trivial closing volume central AR low gradients normal function. The aortic valve has been repaired/replaced. Aortic valve regurgitation is trivial. No aortic stenosis is present. Aortic valve mean gradient measures 6.0 mmHg. Aortic valve peak gradient measures 11.2 mmHg. Aortic valve area, by VTI measures 2.65 cm. There is a 25 mm St. Jude mechanical valve present in the aortic position. Pulmonic Valve: The pulmonic valve was normal in structure. Pulmonic valve regurgitation is not visualized. No evidence of pulmonic stenosis. Aorta: The aortic root is normal in size and  structure. Venous: The inferior vena cava is normal in size with greater than 50% respiratory variability, suggesting right atrial pressure of 3 mmHg. IAS/Shunts: No atrial level shunt detected by color flow Doppler. Additional Comments: A device lead is visualized.  LEFT VENTRICLE PLAX 2D LVIDd:         6.00 cm LVIDs:         5.10 cm LV PW:         1.00 cm LV IVS:        1.00 cm LVOT diam:     2.00 cm LV SV:         58 LV SV Index:   28 LVOT Area:     3.14 cm  RIGHT VENTRICLE            IVC RV S prime:     6.90 cm/s  IVC diam: 1.95 cm TAPSE (M-mode): 1.5 cm LEFT ATRIUM              Index        RIGHT ATRIUM           Index LA diam:        4.40 cm  2.12 cm/m   RA Area:     16.40 cm LA Vol (A2C):   122.0 ml 58.83 ml/m  RA Volume:   45.60 ml  21.99 ml/m LA Vol (A4C):   83.8 ml  40.41 ml/m LA Biplane Vol:  105.0 ml 50.63 ml/m  AORTIC VALVE AV Area (Vmax):    2.39 cm AV Area (Vmean):   2.19 cm AV Area (VTI):     2.65 cm AV Vmax:           167.00 cm/s AV Vmean:          110.500 cm/s AV VTI:            0.218 m AV Peak Grad:      11.2 mmHg AV Mean Grad:      6.0 mmHg LVOT Vmax:         127.00 cm/s LVOT Vmean:        77.100 cm/s LVOT VTI:          0.184 m LVOT/AV VTI ratio: 0.84  AORTA Ao Root diam: 3.10 cm Ao Asc diam:  3.00 cm MITRAL VALVE                  TRICUSPID VALVE MV Area (PHT): 3.92 cm       TR Peak grad:   28.9 mmHg MV Area VTI:   2.23 cm       TR Vmax:        269.00 cm/s MV Peak grad:  6.8 mmHg MV Mean grad:  3.5 mmHg       SHUNTS MV Vmax:       1.30 m/s       Systemic VTI:  0.18 m MV Vmean:      88.8 cm/s      Systemic Diam: 2.00 cm MV Decel Time: 194 msec MR Peak grad:    102.8 mmHg MR Mean grad:    57.0 mmHg MR Vmax:         507.00 cm/s MR Vmean:        341.0 cm/s MR PISA:         0.57 cm MR PISA Eff ROA: 4 mm MR PISA Radius:  0.30 cm MV E velocity: 131.00 cm/s Jenkins Rouge MD Electronically signed by Jenkins Rouge MD Signature Date/Time: 06/28/2021/12:17:05 PM    Final

## 2021-07-01 DIAGNOSIS — I4891 Unspecified atrial fibrillation: Secondary | ICD-10-CM | POA: Diagnosis not present

## 2021-07-01 DIAGNOSIS — J9601 Acute respiratory failure with hypoxia: Secondary | ICD-10-CM | POA: Diagnosis not present

## 2021-07-01 DIAGNOSIS — I5043 Acute on chronic combined systolic (congestive) and diastolic (congestive) heart failure: Secondary | ICD-10-CM | POA: Diagnosis not present

## 2021-07-01 DIAGNOSIS — I10 Essential (primary) hypertension: Secondary | ICD-10-CM | POA: Diagnosis not present

## 2021-07-01 LAB — COMPREHENSIVE METABOLIC PANEL
ALT: 13 U/L (ref 0–44)
AST: 20 U/L (ref 15–41)
Albumin: 4 g/dL (ref 3.5–5.0)
Alkaline Phosphatase: 30 U/L — ABNORMAL LOW (ref 38–126)
Anion gap: 10 (ref 5–15)
BUN: 32 mg/dL — ABNORMAL HIGH (ref 8–23)
CO2: 26 mmol/L (ref 22–32)
Calcium: 9.7 mg/dL (ref 8.9–10.3)
Chloride: 103 mmol/L (ref 98–111)
Creatinine, Ser: 1.37 mg/dL — ABNORMAL HIGH (ref 0.44–1.00)
GFR, Estimated: 42 mL/min — ABNORMAL LOW (ref >60)
Glucose, Bld: 136 mg/dL — ABNORMAL HIGH (ref 70–99)
Potassium: 4.2 mmol/L (ref 3.5–5.1)
Sodium: 139 mmol/L (ref 135–145)
Total Bilirubin: 0.6 mg/dL (ref 0.3–1.2)
Total Protein: 7.1 g/dL (ref 6.5–8.1)

## 2021-07-01 LAB — CBC WITH DIFFERENTIAL/PLATELET
Abs Immature Granulocytes: 0.04 10*3/uL (ref 0.00–0.07)
Basophils Absolute: 0.1 10*3/uL (ref 0.0–0.1)
Basophils Relative: 2 %
Eosinophils Absolute: 0.2 10*3/uL (ref 0.0–0.5)
Eosinophils Relative: 3 %
HCT: 36.4 % (ref 36.0–46.0)
Hemoglobin: 11.7 g/dL — ABNORMAL LOW (ref 12.0–15.0)
Immature Granulocytes: 1 %
Lymphocytes Relative: 23 %
Lymphs Abs: 1.7 10*3/uL (ref 0.7–4.0)
MCH: 29.8 pg (ref 26.0–34.0)
MCHC: 32.1 g/dL (ref 30.0–36.0)
MCV: 92.9 fL (ref 80.0–100.0)
Monocytes Absolute: 0.8 10*3/uL (ref 0.1–1.0)
Monocytes Relative: 10 %
Neutro Abs: 4.8 10*3/uL (ref 1.7–7.7)
Neutrophils Relative %: 61 %
Platelets: 330 10*3/uL (ref 150–400)
RBC: 3.92 MIL/uL (ref 3.87–5.11)
RDW: 14.9 % (ref 11.5–15.5)
WBC: 7.7 10*3/uL (ref 4.0–10.5)
nRBC: 0 % (ref 0.0–0.2)

## 2021-07-01 LAB — PROTIME-INR
INR: 2.9 — ABNORMAL HIGH (ref 0.8–1.2)
Prothrombin Time: 30.1 seconds — ABNORMAL HIGH (ref 11.4–15.2)

## 2021-07-01 LAB — RESP PANEL BY RT-PCR (FLU A&B, COVID) ARPGX2
Influenza A by PCR: NEGATIVE
Influenza B by PCR: NEGATIVE
SARS Coronavirus 2 by RT PCR: NEGATIVE

## 2021-07-01 LAB — MAGNESIUM: Magnesium: 1.8 mg/dL (ref 1.7–2.4)

## 2021-07-01 LAB — BRAIN NATRIURETIC PEPTIDE: B Natriuretic Peptide: 273.4 pg/mL — ABNORMAL HIGH (ref 0.0–100.0)

## 2021-07-01 MED ORDER — WARFARIN SODIUM 2.5 MG PO TABS
3.5000 mg | ORAL_TABLET | Freq: Once | ORAL | Status: AC
  Administered 2021-07-01: 3.5 mg via ORAL
  Filled 2021-07-01: qty 1

## 2021-07-01 NOTE — Progress Notes (Signed)
OT Cancellation Note  Patient Details Name: MARGERITE IMPASTATO MRN: [id number] DOB: 20-Nov-1950   Cancelled Treatment:    Reason Eval/Treat Not Completed: Patient declined, no reason specified;Fatigue/lethargy limiting ability to participate Patient reporting, "I just feel lousy today". Politely declining therapy, no reports of pain when questioned. Therapy will continue to follow.  Corinne Ports E. Bessie Boyte, OTR/L [facility] [place] 07/01/2021, 3:02 PM

## 2021-07-01 NOTE — Progress Notes (Signed)
Mobility Specialist Criteria Algorithm Info.   07/01/21 1040  Mobility  Activity Ambulated with assistance in room;Ambulated with assistance to bathroom  Range of Motion/Exercises Active;All extremities  Level of Assistance Standby assist, set-up cues, supervision of patient - no hands on  Assistive Device None  Distance Ambulated (ft) 20 ft  Activity Response Tolerated well   Patient received in recliner. Deferred mobility at this time but requested assistance to restroom. Ambulated in room at supervision. Returned to chair without incident. Was left with all needs met, call bell in reach. Will return later as time permits per request of pt.   07/01/2021 12:20 PM  [place] Fonnie Crookshanks, [place], [place]  GYKZL:[phone] Office: [phone]

## 2021-07-01 NOTE — Plan of Care (Signed)
?  Problem: Activity: ?Goal: Capacity to carry out activities will improve ?Outcome: Progressing ?  ?

## 2021-07-01 NOTE — Care Management Important Message (Signed)
Important Message  Patient Details  Name: [name] MRN: [id number] Date of Birth: Apr 18, 1951   Medicare Important Message Given:  Yes     [name] 07/01/2021, 10:10 AM

## 2021-07-01 NOTE — Progress Notes (Signed)
Progress Note  Patient Name: [name] Date of Encounter: 07/01/2021  [facility] Cardiologist: Sinclair Grooms, MD    Subjective    71 yo with hx of bicuspid AV - s/p Bentall procedure 2009 ( St. Jude mechanical AV) , [employer] PPM, chronic diastolic CHF, atrial fib, HTN, HLD   Her lasix was recently held at the SNF because of creatinine of 1.5 ,  she developed dyspnea and leg edema   Echo showed new severe LV dysfunction .  HR has improved with the higher dose of coreg. ( 6.25 BID)  She appears euvolumic to me today   Inpatient Medications    Scheduled Meds:  carvedilol  6.25 mg Oral BID WC   cholecalciferol  1,000 Units Oral Daily   docusate sodium  100 mg Oral BID   fenofibrate  160 mg Oral Daily   lamoTRIgine  100 mg Oral Daily   lamoTRIgine  200 mg Oral QHS   levothyroxine  25 mcg Oral QAC breakfast   losartan  25 mg Oral Daily   pantoprazole  40 mg Oral Daily   rosuvastatin  40 mg Oral Daily   sodium chloride flush  3 mL Intravenous Q12H   cyanocobalamin  1,000 mcg Oral Daily   warfarin  3.5 mg Oral ONCE-1600   Warfarin - Pharmacist Dosing Inpatient   Does not apply q1600   Continuous Infusions:  PRN Meds: acetaminophen **OR** acetaminophen, albuterol, bisacodyl, hydrALAZINE, melatonin, ondansetron **OR** ondansetron (ZOFRAN) IV, SUMAtriptan   Vital Signs    Vitals:   06/30/21 0814 06/30/21 1507 06/30/21 1634 07/01/21 0848  BP: 125/82 132/67 124/81 127/79  Pulse: 95 77 81 93  Resp: 20 (!) 23  (!) 23  Temp: 98.4 F (36.9 C) 97.8 F (36.6 C)  98.4 F (36.9 C)  TempSrc: Oral Oral    SpO2: 97% 97%  97%  Weight:      Height:        Intake/Output Summary (Last 24 hours) at 07/01/2021 1045 Last data filed at 07/01/2021 0800 Gross per 24 hour  Intake 57157 ml  Output --  Net 57157 ml   Last 3 Weights 06/30/2021 06/29/2021 06/28/2021  Weight (lbs) 203 lb 12.8 oz 202 lb 207 lb  Weight (kg) 92.443 kg 91.627 kg 93.895 kg      Telemetry     Atrial fib with controlled. V response - Personally Reviewed  ECG     - Personally Reviewed  Physical Exam   GEN: elderly female,  at least mild - mod dementia   Neck: No JVD Cardiac: irreg. Irreg. Mechanical S2 Respiratory: Clear to auscultation bilaterally. GI: Soft, nontender, non-distended  MS: No edema; No deformity. Neuro:  Nonfocal  Psych: Normal affect   Labs    High Sensitivity Troponin:   Recent Labs  Lab 06/27/21 1443  TROPONINIHS 54*     Chemistry Recent Labs  Lab 06/29/21 0242 06/30/21 0248 07/01/21 0645  NA 141 140 139  K 4.3 3.9 4.2  CL 100 103 103  CO2 29 27 26   GLUCOSE 143* 146* 136*  BUN 31* 40* 32*  CREATININE 1.40* 1.52* 1.37*  CALCIUM 10.3 10.3 9.7  MG 2.1 2.0 1.8  PROT 7.3 6.8 7.1  ALBUMIN 4.0 3.7 4.0  AST 18 18 20   ALT 11 10 13   ALKPHOS 33* 32* 30*  BILITOT 0.8 0.6 0.6  GFRNONAA 40* 37* 42*  ANIONGAP 12 10 10     Lipids No results for input(s): CHOL,  TRIG, HDL, LABVLDL, LDLCALC, CHOLHDL in the last 168 hours.  Hematology Recent Labs  Lab 06/29/21 0242 06/30/21 0248 07/01/21 0645  WBC 7.7 6.5 7.7  RBC 3.81* 3.85* 3.92  HGB 11.3* 11.5* 11.7*  HCT 35.5* 36.0 36.4  MCV 93.2 93.5 92.9  MCH 29.7 29.9 29.8  MCHC 31.8 31.9 32.1  RDW 15.3 14.9 14.9  PLT 322 309 330   Thyroid  Recent Labs  Lab 06/27/21 1835  TSH 2.578    BNP Recent Labs  Lab 06/24/21 1139 06/27/21 1446 06/29/21 0242 06/30/21 0248 07/01/21 0645  BNP  --    < > 432.2* 263.0* 273.4*  PROBNP 6,337*  --   --   --   --    < > = values in this interval not displayed.    DDimer No results for input(s): DDIMER in the last 168 hours.   Radiology    No results found.  Cardiac Studies     Patient Profile     71 y.o. female   Assessment & Plan     Acute on chronic combined CHF:  EF is 25-30%.   This reduced EF is new .  Likely related to her tachycardia.  HR is better now that she is on a higher dose of coreg.  Lasix has been held,  I thinks she is  fairly euvolumic today .  Would keep her another day or so to make sure she is stable,  she may ultimately need a low dose of lasix ( perhaps QOD)   2.  AVR :  valve sounds great  3.  AKI:  slow / slight improvement of her creatinine .  4.  Atrial fib;  on coumadin.  Has a VVI pacer          For questions or updates, please contact [place] Please consult [email or web address] for contact info under        Signed, Mertie Moores, MD  07/01/2021, 10:45 AM

## 2021-07-01 NOTE — Progress Notes (Signed)
PROGRESS NOTE                                                                                                                                                                                                             Patient Demographics:    [name], is a 71 y.o. female, DOB - January 14, 1951, TDV:[id number]  Outpatient Primary MD for the patient is Mosie Lukes, MD    LOS - 4  Admit date - 06/27/2021    Chief Complaint  Patient presents with   Shortness of Breath            Brief Narrative (HPI from H&P)  71 y.o. female, with history of mechanical Saint Jude aortic valve placed in 2011, paroxysmal atrial fibrillation [place] vas 2 score of greater than 5 with embolic strokes, multiple falls with subdural bleed requiring craniotomy, DM type II, bipolar disorder, migraine, hypertension, syncope, tachybradycardia syndrome s/p pacemaker placement, dyslipidemia, who lives in a nursing home and presented with shortness of breath and was admitted with a diagnosis of CHF.   Subjective:   Patient in bed, appears comfortable, denies any headache, no fever, no chest pain or pressure, no shortness of breath , no abdominal pain. No new focal weakness.   Assessment  & Plan :    Assessment and Plan: No notes have been filed under this hospital service. Service: Hospitalist    1.  Acute hypoxic respiratory failure brought on by acute on chronic systolic and diastolic heart failure EF now percent down from 50% few years ago.  She also dropped a few doses of Lasix at SNF due to some miscommunication, no chest pain or acute EKG changes.  She has responded very well to IV Lasix with 3-1/2 to 4 L of diuresis in the first 24 hours, shortness of breath much improved from BiPAP she is down to room air and exam shows little to no fluid overload on exam on 06/30/2021.  Lasix held today as well due to bump in creatinine.  Case discussed with the  cardiologist on 07/01/2021 plan is to continue Coreg, cardiology has added low-dose Cozaar and will initiate low-dose Lasix on 07/02/2021 and monitor her clinical response.  For now refraining from left heart cath.  Monitor with optimization of her medical regimen.  Drop in EF could be due to uncontrolled A.  fib.    2.  Paroxysmal atrial fibrillation with [place] vas 2 score of greater than 5 with history of embolic strokes.  Coreg and Coumadin combination to be continued, pharmacy monitoring INR.   3.  History of embolic strokes due to A. fib with multiple falls and subdural hematoma and bleeds.  Supportive care, PT OT high fall risk.  Post discharge back to SNF.   4.  Dyslipidemia.  Continue home dose statin   5.  Hypothyroidism.  Continue home dose Synthroid.   6.  Hypertension.  Continue Coreg and diuresis with Lasix and monitor.   7.  GERD.  On PPI continue.   8.  CKD 3A.  Baseline creatinine between baseline of 1.2 and 1.4.  Monitor with addition of Cozaar.  Diuretics per cardiology.   9.  History of mechanical Saint Jude aortic valve placed in 2011.  Continue with supportive care.  She is on Coumadin but most likely due to A. fib with embolic strokes.   10.  History of migraine headaches.  Monitor with supportive care.   11.  History of delirium.  She seems to be on extremely high doses of Seroquel, will have pharmacy reconcile her home medications and then address.  Will be at risk for encephalopathy in the hospital setting.  Will follow fall precautions and aspiration precautions while here, minimize narcotics and benzodiazepine use.  12.  Hypokalemia.  Replaced.       Condition - Fair  Family Communication  :  Daughter Amy [phone] 06/27/21, 06/28/21, 06/29/2021  Code Status :  Full  Consults  :  Cards  PUD Prophylaxis :    Procedures  :     TTE - 1. EF has decreased since echo done 2021 . Left ventricular ejection fraction, by estimation, is 25 to 30%. The left ventricle has  severely decreased function. The left ventricle demonstrates global hypokinesis. The left ventricular internal cavity size  was moderately dilated. Left ventricular diastolic parameters are indeterminate.  2. Pacing wires in RV/RA. Right ventricular systolic function is normal. The right ventricular size is normal. There is mildly elevated pulmonary artery systolic pressure.  3. Left atrial size was moderately dilated.  4. The mitral valve is degenerative. Mild mitral valve regurgitation. No evidence of mitral stenosis. Moderate mitral annular calcification.  5. Tricuspid valve regurgitation is mild to moderate.  6. Post AVR with 25 mm St Jude trivial closing volume central AR low gradients normal function . The aortic valve has been repaired/replaced. Aortic valve regurgitation is trivial. No aortic stenosis is present. There is a 25 mm St. Jude mechanical valve present in the aortic position.  7. The inferior vena cava is normal in size with greater than 50% respiratory variability, suggesting right atrial pressure of 3 mmHg.      Disposition Plan  :    Status is: Inpatient - CHF  DVT Prophylaxis  :    Place and maintain sequential compression device Start: 06/27/21 1645 warfarin (COUMADIN) tablet 3.5 mg    Lab Results  Component Value Date   PLT 330 07/01/2021    Diet :  Diet Order             DIET SOFT Room service appropriate? Yes; Fluid consistency: Thin; Fluid restriction: 1500 mL Fluid  Diet effective now                    Inpatient Medications  Scheduled Meds:  carvedilol  6.25 mg Oral BID WC  cholecalciferol  1,000 Units Oral Daily   docusate sodium  100 mg Oral BID   fenofibrate  160 mg Oral Daily   lamoTRIgine  100 mg Oral Daily   lamoTRIgine  200 mg Oral QHS   levothyroxine  25 mcg Oral QAC breakfast   losartan  25 mg Oral Daily   pantoprazole  40 mg Oral Daily   rosuvastatin  40 mg Oral Daily   sodium chloride flush  3 mL Intravenous Q12H   cyanocobalamin   1,000 mcg Oral Daily   warfarin  3.5 mg Oral ONCE-1600   Warfarin - Pharmacist Dosing Inpatient   Does not apply q1600   Continuous Infusions: PRN Meds:.acetaminophen **OR** acetaminophen, albuterol, bisacodyl, hydrALAZINE, melatonin, ondansetron **OR** ondansetron (ZOFRAN) IV, SUMAtriptan  Antibiotics  :    Anti-infectives (From admission, onward)    None        Time Spent in minutes  30   Lala Lund M.D on 07/01/2021 at 12:22 PM  To page go to [email or web address]   [facility] -  Office  [phone]  See all Orders from today for further details    Objective:   Vitals:   06/30/21 0814 06/30/21 1507 06/30/21 1634 07/01/21 0848  BP: 125/82 132/67 124/81 127/79  Pulse: 95 77 81 93  Resp: 20 (!) 23  (!) 23  Temp: 98.4 F (36.9 C) 97.8 F (36.6 C)  98.4 F (36.9 C)  TempSrc: Oral Oral    SpO2: 97% 97%  97%  Weight:      Height:        Wt Readings from Last 3 Encounters:  06/30/21 92.4 kg  06/24/21 95.3 kg  01/02/21 100.1 kg     Intake/Output Summary (Last 24 hours) at 07/01/2021 1222 Last data filed at 07/01/2021 0800 Gross per 24 hour  Intake 56917 ml  Output --  Net 56917 ml     Physical Exam  Awake Alert, No new F.N deficits, Normal affect [place].AT,PERRAL Supple Neck, No JVD,   Symmetrical Chest wall movement, Good air movement bilaterally, CTAB RRR,No Gallops, Rubs or new Murmurs,  +ve B.Sounds, Abd Soft, No tenderness,   No Cyanosis, Clubbing or edema     Data Review:    CBC Recent Labs  Lab 06/27/21 1443 06/27/21 1503 06/27/21 1504 06/28/21 0337 06/29/21 0242 06/30/21 0248 07/01/21 0645  WBC 8.2  --   --  7.4 7.7 6.5 7.7  HGB 10.6*   < > 11.6* 10.3* 11.3* 11.5* 11.7*  HCT 33.1*   < > 34.0* 32.1* 35.5* 36.0 36.4  PLT 282  --   --  250 322 309 330  MCV 94.0  --   --  92.5 93.2 93.5 92.9  MCH 30.1  --   --  29.7 29.7 29.9 29.8  MCHC 32.0  --   --  32.1 31.8 31.9 32.1  RDW 15.7*  --   --  15.3 15.3 14.9 14.9  LYMPHSABS 1.1  --    --  1.8 1.9 2.0 1.7  MONOABS 0.4  --   --  0.5 0.8 0.8 0.8  EOSABS 0.2  --   --  0.2 0.2 0.2 0.2  BASOSABS 0.1  --   --  0.1 0.1 0.1 0.1   < > = values in this interval not displayed.    Electrolytes Recent Labs  Lab 06/27/21 1443 06/27/21 1446 06/27/21 1503 06/27/21 1504 06/27/21 1835 06/28/21 0337 06/29/21 0242 06/30/21 0248 07/01/21 0645  NA 138  --    < >  140  --  138 141 140 139  K 3.8  --    < > 3.9  --  3.4* 4.3 3.9 4.2  CL 103  --   --  104  --  101 100 103 103  CO2 24  --   --   --   --  28 29 27 26   GLUCOSE 134*  --   --  130*  --  110* 143* 146* 136*  BUN 21  --   --  21  --  20 31* 40* 32*  CREATININE 1.11*  --   --  1.10*  --  1.21* 1.40* 1.52* 1.37*  CALCIUM 8.9  --   --   --   --  8.7* 10.3 10.3 9.7  AST  --   --   --   --   --  18 18 18 20   ALT  --   --   --   --   --  12 11 10 13   ALKPHOS  --   --   --   --   --  26* 33* 32* 30*  BILITOT  --   --   --   --   --  1.0 0.8 0.6 0.6  ALBUMIN  --   --   --   --   --  3.6 4.0 3.7 4.0  MG  --   --   --   --   --  1.8 2.1 2.0 1.8  CRP  --   --   --   --  0.6  --   --   --   --   INR  --   --   --   --  2.7* 2.8* 2.7* 2.8* 2.9*  TSH  --   --   --   --  2.578  --   --   --   --   BNP  --  854.4*  --   --   --  743.6* 432.2* 263.0* 273.4*   < > = values in this interval not displayed.    ------------------------------------------------------------------------------------------------------------------ No results for input(s): CHOL, HDL, LDLCALC, TRIG, CHOLHDL, LDLDIRECT in the last 72 hours.  Lab Results  Component Value Date   HGBA1C 4.8 07/24/2020    No results for input(s): TSH, T4TOTAL, T3FREE, THYROIDAB in the last 72 hours.  Invalid input(s): FREET3  ------------------------------------------------------------------------------------------------------------------ ID Labs Recent Labs  Lab 06/27/21 1443 06/27/21 1504 06/27/21 1835 06/28/21 0337 06/29/21 0242 06/30/21 0248 07/01/21 0645  WBC 8.2   --   --  7.4 7.7 6.5 7.7  PLT 282  --   --  250 322 309 330  CRP  --   --  0.6  --   --   --   --   CREATININE 1.11* 1.10*  --  1.21* 1.40* 1.52* 1.37*   Cardiac Enzymes No results for input(s): CKMB, TROPONINI, MYOGLOBIN in the last 168 hours.  Invalid input(s): CK   Radiology Reports DG Chest Port 1 View  Result Date: 06/27/2021 CLINICAL DATA:  Shortness of breath. Progressive worsening over 3 weeks. EXAM: PORTABLE CHEST 1 VIEW COMPARISON:  AP chest views 07/23/2020 FINDINGS: Left chest wall cardiac pacer is again seen with leads overlying the right atrium and right ventricle. Status post median sternotomy. Cardiac silhouette is again moderately enlarged. Cardiac valve prosthesis is again noted overlying the mid heart. Mediastinal contours are grossly within normal limits. Mild interval increase in mild-to-moderate interstitial thickening  bilaterally. Possible tiny bilateral pleural effusions. No pneumothorax. No acute skeletal abnormality. Right axillary surgical clips are again noted. IMPRESSION: Interval increase in mild-to-moderate interstitial thickening likely mild to moderate interstitial pulmonary edema. Stable cardiomegaly. Electronically Signed   By: Yvonne Kendall M.D.   On: 06/27/2021 14:54   ECHOCARDIOGRAM COMPLETE  Result Date: 06/28/2021    ECHOCARDIOGRAM REPORT   Patient Name:   BO ROGUE Date of Exam: 06/28/2021 Medical Rec #:  [id number]     Height:       68.0 in Accession #:    [id number]    Weight:       207.0 lb Date of Birth:  Dec 21, 1950      BSA:          2.074 m Patient Age:    55 years      BP:           108/75 mmHg Patient Gender: F             HR:           102 bpm. Exam Location:  Inpatient Procedure: 2D Echo, 3D Echo, Intracardiac Opacification Agent, Cardiac Doppler            and Color Doppler Indications:    I50.40* Unspecified combined systolic (congestive) and diastolic                 (congestive) heart failure  History:        Patient has prior history of  Echocardiogram examinations, most                 recent 03/28/2020. Abnormal ECG and Pacemaker, Aortic Valve                 Disease, Arrythmias:Tachycardia, Bradycardia and Atrial                 Fibrillation, Signs/Symptoms:Syncope and Altered Mental Status;                 Risk Factors:Dyslipidemia and Hypertension.                 Aortic Valve: 25 mm St. Jude mechanical valve is present in the                 aortic position.  Sonographer:    Roseanna Rainbow RDCS Referring Phys: [id number] Mackintosh [facility]  Sonographer Comments: Technically difficult study due to poor echo windows. Image acquisition challenging due to patient body habitus. IMPRESSIONS  1. EF has decreased since echo done 2021 . Left ventricular ejection fraction, by estimation, is 25 to 30%. The left ventricle has severely decreased function. The left ventricle demonstrates global hypokinesis. The left ventricular internal cavity size  was moderately dilated. Left ventricular diastolic parameters are indeterminate.  2. Pacing wires in RV/RA. Right ventricular systolic function is normal. The right ventricular size is normal. There is mildly elevated pulmonary artery systolic pressure.  3. Left atrial size was moderately dilated.  4. The mitral valve is degenerative. Mild mitral valve regurgitation. No evidence of mitral stenosis. Moderate mitral annular calcification.  5. Tricuspid valve regurgitation is mild to moderate.  6. Post AVR with 25 mm St Jude trivial closing volume central AR low gradients normal function . The aortic valve has been repaired/replaced. Aortic valve regurgitation is trivial. No aortic stenosis is present. There is a 25 mm St. Jude mechanical valve present in the aortic position.  7. The inferior vena cava is normal in size  with greater than 50% respiratory variability, suggesting right atrial pressure of 3 mmHg. FINDINGS  Left Ventricle: EF has decreased since echo done 2021. Left ventricular ejection fraction, by estimation, is 25  to 30%. The left ventricle has severely decreased function. The left ventricle demonstrates global hypokinesis. Definity contrast agent was given IV to delineate the left ventricular endocardial borders. The left ventricular internal cavity size was moderately dilated. There is no left ventricular hypertrophy. Left ventricular diastolic parameters are indeterminate. Right Ventricle: Pacing wires in RV/RA. The right ventricular size is normal. No increase in right ventricular wall thickness. Right ventricular systolic function is normal. There is mildly elevated pulmonary artery systolic pressure. The tricuspid regurgitant velocity is 2.69 m/s, and with an assumed right atrial pressure of 8 mmHg, the estimated right ventricular systolic pressure is 95.1 mmHg. Left Atrium: Left atrial size was moderately dilated. Right Atrium: Right atrial size was normal in size. Pericardium: There is no evidence of pericardial effusion. Mitral Valve: The mitral valve is degenerative in appearance. There is moderate thickening of the mitral valve leaflet(s). There is moderate calcification of the mitral valve leaflet(s). Moderate mitral annular calcification. Mild mitral valve regurgitation. No evidence of mitral valve stenosis. MV peak gradient, 6.8 mmHg. The mean mitral valve gradient is 3.5 mmHg. Tricuspid Valve: The tricuspid valve is normal in structure. Tricuspid valve regurgitation is mild to moderate. No evidence of tricuspid stenosis. Aortic Valve: Post AVR with 25 mm St Jude trivial closing volume central AR low gradients normal function. The aortic valve has been repaired/replaced. Aortic valve regurgitation is trivial. No aortic stenosis is present. Aortic valve mean gradient measures 6.0 mmHg. Aortic valve peak gradient measures 11.2 mmHg. Aortic valve area, by VTI measures 2.65 cm. There is a 25 mm St. Jude mechanical valve present in the aortic position. Pulmonic Valve: The pulmonic valve was normal in structure.  Pulmonic valve regurgitation is not visualized. No evidence of pulmonic stenosis. Aorta: The aortic root is normal in size and structure. Venous: The inferior vena cava is normal in size with greater than 50% respiratory variability, suggesting right atrial pressure of 3 mmHg. IAS/Shunts: No atrial level shunt detected by color flow Doppler. Additional Comments: A device lead is visualized.  LEFT VENTRICLE PLAX 2D LVIDd:         6.00 cm LVIDs:         5.10 cm LV PW:         1.00 cm LV IVS:        1.00 cm LVOT diam:     2.00 cm LV SV:         58 LV SV Index:   28 LVOT Area:     3.14 cm  RIGHT VENTRICLE            IVC RV S prime:     6.90 cm/s  IVC diam: 1.95 cm TAPSE (M-mode): 1.5 cm LEFT ATRIUM              Index        RIGHT ATRIUM           Index LA diam:        4.40 cm  2.12 cm/m   RA Area:     16.40 cm LA Vol (A2C):   122.0 ml 58.83 ml/m  RA Volume:   45.60 ml  21.99 ml/m LA Vol (A4C):   83.8 ml  40.41 ml/m LA Biplane Vol: 105.0 ml 50.63 ml/m  AORTIC VALVE AV Area (Vmax):  2.39 cm AV Area (Vmean):   2.19 cm AV Area (VTI):     2.65 cm AV Vmax:           167.00 cm/s AV Vmean:          110.500 cm/s AV VTI:            0.218 m AV Peak Grad:      11.2 mmHg AV Mean Grad:      6.0 mmHg LVOT Vmax:         127.00 cm/s LVOT Vmean:        77.100 cm/s LVOT VTI:          0.184 m LVOT/AV VTI ratio: 0.84  AORTA Ao Root diam: 3.10 cm Ao Asc diam:  3.00 cm MITRAL VALVE                  TRICUSPID VALVE MV Area (PHT): 3.92 cm       TR Peak grad:   28.9 mmHg MV Area VTI:   2.23 cm       TR Vmax:        269.00 cm/s MV Peak grad:  6.8 mmHg MV Mean grad:  3.5 mmHg       SHUNTS MV Vmax:       1.30 m/s       Systemic VTI:  0.18 m MV Vmean:      88.8 cm/s      Systemic Diam: 2.00 cm MV Decel Time: 194 msec MR Peak grad:    102.8 mmHg MR Mean grad:    57.0 mmHg MR Vmax:         507.00 cm/s MR Vmean:        341.0 cm/s MR PISA:         0.57 cm MR PISA Eff ROA: 4 mm MR PISA Radius:  0.30 cm MV E velocity: 131.00 cm/s Jenkins Rouge MD Electronically signed by Jenkins Rouge MD Signature Date/Time: 06/28/2021/12:17:05 PM    Final

## 2021-07-01 NOTE — TOC Progression Note (Signed)
Transition of Care [facility]) - Progression Note    Patient Details  Name: [name] MRN: [id number] Date of Birth: February 07, 1951  Transition of Care [facility]) CM/SW [place], [place] Phone Number: 07/01/2021, 11:59 AM  Clinical Narrative:     CSW  spoke with [name] who confirmed plan is for patient to return back to [place] [facility] when medically ready. Star with [facility] confirmed patient is good to return when medically ready. CSW will continue to follow and assist with patients dc planning needs.  Expected Discharge Plan: [place] Barriers to Discharge: Continued Medical Work up  Expected Discharge Plan and Services Expected Discharge Plan: [place] arrangements for the past 2 months: [place]                                       Social Determinants of Health (SDOH) Interventions    Readmission Risk Interventions Readmission Risk Prevention Plan 05/03/2020  Transportation Screening Complete  Home Care Screening Complete  Medication Review (RN CM) Complete  Some recent data might be hidden

## 2021-07-01 NOTE — NC FL2 (Signed)
[place] LEVEL OF CARE SCREENING TOOL     IDENTIFICATION  Patient Name: [name] Birthdate: Feb 09, 1951 Sex: female Admission Date (Current Location): 06/27/2021  [facility] and [place] Number:  Herbalist and Address:  [facility] [place], Olivia Lopez de Gutierrez [address] [place], [place] [address]      Provider Number: [id number]  Attending Physician Name and Address:  Thurnell Lose, MD  Relative Name and Phone Number:  Amy [phone]    Current Level of Care: Hospital Recommended Level of Care: [place] Prior Approval Number:    Date Approved/Denied:   PASRR Number: [id number]  Discharge Plan: SNF    Current Diagnoses: Patient Active Problem List   Diagnosis Date Noted   Acute hypoxemic respiratory failure ([place]) 06/27/2021   Subarachnoid hematoma 07/24/2020   Acute cystitis without hematuria 07/24/2020   Fall at home, initial encounter 07/24/2020   Acute on chronic combined systolic and diastolic CHF (congestive heart failure) ([place]) 07/24/2020   Essential hypertension 07/24/2020   GERD without esophagitis 07/24/2020   Atrial fibrillation and flutter ([place]) 07/24/2020   Bipolar disorder ([place]) 07/24/2020   Subdural hematoma 07/24/2020   Hypothyroidism 07/24/2020   Acute renal failure superimposed on stage 3a chronic kidney disease ([place]) 07/24/2020   S/P AVR 07/24/2020   Vitamin B 12 deficiency 05/06/2020   Supratherapeutic INR 04/30/2020   Elevated liver enzymes 04/30/2020   Elevated TSH 04/30/2020   Altered mental status 04/29/2020   Tachycardia-bradycardia syndrome ([place]) 04/25/2020   Pacemaker 04/25/2020   Recurrent falls 03/27/2020   Bradycardia 08/23/2019   Prolonged QT interval    Near syncope 08/21/2019   GAD (generalized anxiety disorder) 04/25/2018   Ankle pain 02/04/2018   Nocturia 11/02/2017   Low back pain 11/02/2017   Type 2 diabetes mellitus with diabetic polyneuropathy, without long-term current  use of insulin ([place]) 11/02/2017   Hyperlipidemia associated with type 2 diabetes mellitus ([place]) 06/05/2016   Migraine 06/05/2016   Encounter for therapeutic drug monitoring 09/13/2013   Essential hypertension, benign 04/05/2013   Atrial fibrillation ([place]) 03/18/2013   Long term current use of anticoagulant therapy 03/11/2013   UTI (urinary tract infection) 02/10/2013   Pernicious anemia 02/21/2011   Renal insufficiency 12/30/2010   Hyperthyroidism 12/18/2010   Cellulitis and abscess of left leg 11/18/2010   Anemia 05/28/2010   Bipolar disorder ([place]) 03/11/2010   DIVERTICULOSIS OF COLON 03/11/2010   Constipation 03/11/2010   Hx of mechanical aortic valve replacement 03/11/2010    Orientation RESPIRATION BLADDER Height & Weight     Self, Time, Situation, Place (WDL)  Normal Continent Weight: 203 lb 12.8 oz (92.4 kg) Height:  5\' 8"  (172.7 cm)  BEHAVIORAL SYMPTOMS/MOOD NEUROLOGICAL BOWEL NUTRITION STATUS      Continent (WDL) Diet (Please see discharge summary)  AMBULATORY STATUS COMMUNICATION OF NEEDS Skin   Supervision (Intermittent Supervision/Assistance) Verbally Other (Comment) (Appropriate for ethnicity,dry)                       Personal Care Assistance Level of Assistance  Bathing, Feeding, Dressing Bathing Assistance: Limited assistance Feeding assistance: Independent (able to feed self) Dressing Assistance: Limited assistance     Functional Limitations Info  Sight, Hearing, Speech Sight Info: Adequate Hearing Info: Adequate Speech Info: Adequate    SPECIAL CARE FACTORS FREQUENCY  PT (By licensed PT), OT (By licensed OT)     PT Frequency: 5x min weekly OT Frequency: 5x min weekly  Contractures Contractures Info: Not present    Additional Factors Info  Code Status, Allergies Code Status Info: FULL Allergies Info: Mucinex (guaifenesin Er),Keflex (cephalexin),Keflex (cephalexin),Mucinex (guaifenesin Er),Sulfa Antibiotics,Sulfa  Antibiotics,Sulfonamide Derivatives           Current Medications (07/01/2021):  This is the current hospital active medication list Current Facility-Administered Medications  Medication Dose Route Frequency Provider Last Rate Last Admin   acetaminophen (TYLENOL) tablet 650 mg  650 mg Oral Q6H PRN Thurnell Lose, MD   650 mg at 06/29/21 2113   Or   acetaminophen (TYLENOL) suppository 650 mg  650 mg Rectal Q6H PRN Thurnell Lose, MD       albuterol (PROVENTIL) (2.5 MG/3ML) 0.083% nebulizer solution 3 mL  3 mL Inhalation Q6H PRN Thurnell Lose, MD       bisacodyl (DULCOLAX) EC tablet 5 mg  5 mg Oral Daily PRN Thurnell Lose, MD       carvedilol (COREG) tablet 6.25 mg  6.25 mg Oral BID WC Chandrasekhar, Mahesh A, MD   6.25 mg at 07/01/21 1749   cholecalciferol (VITAMIN D) tablet 1,000 Units  1,000 Units Oral Daily Thurnell Lose, MD   1,000 Units at 07/01/21 0851   docusate sodium (COLACE) capsule 100 mg  100 mg Oral BID Thurnell Lose, MD   100 mg at 06/30/21 2225   fenofibrate tablet 160 mg  160 mg Oral Daily Thurnell Lose, MD   160 mg at 07/01/21 0851   hydrALAZINE (APRESOLINE) injection 10 mg  10 mg Intravenous Q6H PRN Thurnell Lose, MD       lamoTRIgine (LAMICTAL) tablet 100 mg  100 mg Oral Daily Lala Lund K, MD   100 mg at 07/01/21 4496   lamoTRIgine (LAMICTAL) tablet 200 mg  200 mg Oral QHS Lala Lund K, MD   200 mg at 06/30/21 2225   levothyroxine (SYNTHROID) tablet 25 mcg  25 mcg Oral QAC breakfast Thurnell Lose, MD   25 mcg at 07/01/21 0600   losartan (COZAAR) tablet 25 mg  25 mg Oral Daily Thurnell Lose, MD   25 mg at 07/01/21 7591   melatonin tablet 5 mg  5 mg Oral QHS PRN Renae Fickle, MD   5 mg at 06/30/21 2225   ondansetron (ZOFRAN) tablet 4 mg  4 mg Oral Q6H PRN Thurnell Lose, MD       Or   ondansetron [facility]) injection 4 mg  4 mg Intravenous Q6H PRN Thurnell Lose, MD   4 mg at 06/28/21 6384   pantoprazole (PROTONIX) EC  tablet 40 mg  40 mg Oral Daily Thurnell Lose, MD   40 mg at 07/01/21 0851   rosuvastatin (CRESTOR) tablet 40 mg  40 mg Oral Daily Thurnell Lose, MD   40 mg at 07/01/21 0851   sodium chloride flush (NS) 0.9 % injection 3 mL  3 mL Intravenous Q12H Thurnell Lose, MD   3 mL at 06/30/21 2226   SUMAtriptan (IMITREX) tablet 50 mg  50 mg Oral Q2H PRN Thurnell Lose, MD       vitamin B-12 (CYANOCOBALAMIN) tablet 1,000 mcg  1,000 mcg Oral Daily Thurnell Lose, MD   1,000 mcg at 07/01/21 6659   warfarin (COUMADIN) tablet 3.5 mg  3.5 mg Oral ONCE-1600 Einar Grad, [facility]       Warfarin - Pharmacist Dosing Inpatient   Does not apply [id number] [place], [facility]   Given  at 06/30/21 1636     Discharge Medications: Please see discharge summary for a list of discharge medications.  Relevant Imaging Results:  Relevant Lab Results:   Additional Information [id number]  Milas Gain, LCSWA

## 2021-07-01 NOTE — Progress Notes (Signed)
ANTICOAGULATION CONSULT NOTE  Pharmacy Consult for warfarin Indication: atrial fibrillation  Allergies  Allergen Reactions   Mucinex [Guaifenesin Er] Other (See Comments)    Severe headaches    Keflex [Cephalexin] Other (See Comments)    Headaches and dizziness   Keflex [Cephalexin] Other (See Comments)    Headaches and/or dizziness- "allergic," per MAR   Mucinex [Guaifenesin Er] Other (See Comments)    Severe headaches- "allergic," per MAR   Sulfa Antibiotics Other (See Comments)    headaches   Sulfa Antibiotics Other (See Comments)    Headaches- "allergic," per [facility]    Sulfonamide Derivatives Other (See Comments)    Headaches     Patient Measurements: Height: 5\' 8"  (172.7 cm) Weight: 92.4 kg (203 lb 12.8 oz) IBW/kg (Calculated) : 63.9 Heparin Dosing Weight: 84.9 kg   Vital Signs:    Labs: Recent Labs    06/29/21 0242 06/30/21 0248 07/01/21 0645  HGB 11.3* 11.5* 11.7*  HCT 35.5* 36.0 36.4  PLT 322 309 330  LABPROT 28.6* 29.4* 30.1*  INR 2.7* 2.8* 2.9*  CREATININE 1.40* 1.52* 1.37*     Estimated Creatinine Clearance: 45.4 mL/min (A) (by C-G formula based on SCr of 1.37 mg/dL (H)).   Medical History: Past Medical History:  Diagnosis Date   ANEMIA 05/28/2010   AORTIC VALVE REPLACEMENT, HX OF 03/11/2010   Mechanical prosthesis   Ascending aortic aneurysm    Atrial fibrillation (HCC)    Atrial flutter ([place])    BIPOLAR AFFECTIVE DISORDER 03/11/2010   Bradycardia    Chronic diastolic CHF (congestive heart failure) (HCC)    Depression    Diabetes ([place])    Diverticulitis 12/18/2010   Falls    FIBROIDS, UTERUS 03/11/2010   Gout 09/04/2016   Grave's disease 12/2010   Hyperlipidemia associated with type 2 diabetes mellitus ([place]) 06/05/2016   Hypertension    Low back pain 11/02/2017   Migraine 06/05/2016   Mixed hyperlipidemia 03/11/2010   Obesity    Pacemaker    Prolonged QT interval    SAH (subarachnoid hemorrhage) ([place])    Skin cancer     Subdural hematoma    Syncope 08/22/2019   UTI (lower urinary tract infection) 08/14/2011    Assessment: 51 YOF with h/o recurrent AF and h/o mechanical AV valve replacement on warfarin at facility. Pharmacy consulted to resume warfarin. Per facility records, patient's last dose of warfarin was on 1/31.  Warfarin regimen PTA: 3.5 mg daily (recently reduced from 4mg  daily), total weekly dose 24.5mg    INR remains therapeutic at 2.9, CBC stable.  Goal of Therapy:  INR 2.5-3.5  Monitor platelets by anticoagulation protocol: Yes   Plan:  -Warfarin 3.5 mg x 1 dose tonight  -Daily INR  Arrie Senate, PharmD, BCPS, [facility] Clinical Pharmacist [phone] Please check AMION for all [facility] Pharmacy numbers 07/01/2021

## 2021-07-02 ENCOUNTER — Other Ambulatory Visit: Payer: Self-pay

## 2021-07-02 ENCOUNTER — Encounter (HOSPITAL_COMMUNITY): Payer: Self-pay | Admitting: Internal Medicine

## 2021-07-02 DIAGNOSIS — J9601 Acute respiratory failure with hypoxia: Secondary | ICD-10-CM | POA: Diagnosis not present

## 2021-07-02 DIAGNOSIS — I10 Essential (primary) hypertension: Secondary | ICD-10-CM | POA: Diagnosis not present

## 2021-07-02 DIAGNOSIS — I5043 Acute on chronic combined systolic (congestive) and diastolic (congestive) heart failure: Secondary | ICD-10-CM | POA: Diagnosis not present

## 2021-07-02 DIAGNOSIS — I4891 Unspecified atrial fibrillation: Secondary | ICD-10-CM | POA: Diagnosis not present

## 2021-07-02 LAB — BASIC METABOLIC PANEL
Anion gap: 9 (ref 5–15)
BUN: 33 mg/dL — ABNORMAL HIGH (ref 8–23)
CO2: 26 mmol/L (ref 22–32)
Calcium: 9.6 mg/dL (ref 8.9–10.3)
Chloride: 105 mmol/L (ref 98–111)
Creatinine, Ser: 1.32 mg/dL — ABNORMAL HIGH (ref 0.44–1.00)
GFR, Estimated: 43 mL/min — ABNORMAL LOW (ref >60)
Glucose, Bld: 123 mg/dL — ABNORMAL HIGH (ref 70–99)
Potassium: 3.9 mmol/L (ref 3.5–5.1)
Sodium: 140 mmol/L (ref 135–145)

## 2021-07-02 LAB — PROTIME-INR
INR: 3 — ABNORMAL HIGH (ref 0.8–1.2)
Prothrombin Time: 30.9 seconds — ABNORMAL HIGH (ref 11.4–15.2)

## 2021-07-02 LAB — MAGNESIUM: Magnesium: 1.7 mg/dL (ref 1.7–2.4)

## 2021-07-02 MED ORDER — CARVEDILOL 6.25 MG PO TABS
6.2500 mg | ORAL_TABLET | Freq: Once | ORAL | Status: AC
  Administered 2021-07-02: 6.25 mg via ORAL
  Filled 2021-07-02: qty 1

## 2021-07-02 MED ORDER — MELATONIN 5 MG PO TABS
10.0000 mg | ORAL_TABLET | Freq: Once | ORAL | Status: AC
  Administered 2021-07-02: 10 mg via ORAL
  Filled 2021-07-02: qty 2

## 2021-07-02 MED ORDER — WARFARIN SODIUM 2.5 MG PO TABS
3.5000 mg | ORAL_TABLET | Freq: Once | ORAL | Status: AC
  Administered 2021-07-02: 3.5 mg via ORAL
  Filled 2021-07-02: qty 1

## 2021-07-02 MED ORDER — CARVEDILOL 12.5 MG PO TABS
12.5000 mg | ORAL_TABLET | Freq: Two times a day (BID) | ORAL | Status: DC
  Administered 2021-07-02 – 2021-07-07 (×10): 12.5 mg via ORAL
  Filled 2021-07-02 (×10): qty 1

## 2021-07-02 NOTE — TOC Progression Note (Signed)
Transition of Care [facility]) - Progression Note    Patient Details  Name: [name] MRN: [id number] Date of Birth: 05-21-1951  Transition [facility]) CM/SW [place], [place] Phone Number: 07/02/2021, 1:26 PM  Clinical Narrative:     Patient has SN[facility] bed [facility] medically ready.CSW updated [facility] with [facility]. Patient not medically ready for dc today. CSW will continue to follow and assist with patients dc planning needs.  Expected Discharge Plan: [place] Barriers to Discharge: Continued Medical Work up  Expected Discharge Plan and Services Expected Discharge Plan: [place] arrangements for the past 2 months: [place]                                       Social Determinants of Health (SDOH) Interventions    Readmission Risk Interventions Readmission Risk Prevention Plan 05/03/2020  Transportation Screening Complete  Home Care Screening Complete  Medication Review (RN CM) Complete  Some recent data might be hidden

## 2021-07-02 NOTE — Discharge Instructions (Addendum)
Follow with Primary MD Mosie Lukes, MD in 7 days and your cardiologist Dr. Tamala Julian within 7 to 10 days.  Get CBC, BMP in 3-4 days at [facility], INR on 02/14   Activity: As tolerated with Full fall precautions use walker/cane & assistance as needed  Disposition SNF  Diet: Heart Healthy with 1.5 L fluid restriction per day.  Check your Weight same time everyday, if you gain over 2 pounds, or you develop in leg swelling, experience more shortness of breath or chest pain, call your Primary MD immediately. Follow Cardiac Low Salt Diet and 1.5 lit/day fluid restriction.  Special Instructions: If you have smoked or chewed Tobacco  in the last 2 yrs please stop smoking, stop any regular Alcohol  and or any Recreational drug use.  On your next visit with your primary care physician please Get Medicines reviewed and adjusted.  Please request your Prim.MD to go over all Hospital Tests and Procedure/Radiological results at the follow up, please get all Hospital records sent to your Prim MD by signing hospital release before you go home.  If you experience worsening of your admission symptoms, develop shortness of breath, life threatening emergency, suicidal or homicidal thoughts you must seek medical attention immediately by calling 911 or calling your MD immediately  if symptoms less severe.  You Must read complete instructions/literature along with all the possible adverse reactions/side effects for all the Medicines you take and that have been prescribed to you. Take any new Medicines after you have completely understood and accpet all the possible adverse reactions/side effects.

## 2021-07-02 NOTE — Progress Notes (Signed)
[place] for warfarin Indication: atrial fibrillation  Allergies  Allergen Reactions   Mucinex [Guaifenesin Er] Other (See Comments)    Severe headaches    Keflex [Cephalexin] Other (See Comments)    Headaches and dizziness   Keflex [Cephalexin] Other (See Comments)    Headaches and/or dizziness- "allergic," per MAR   Mucinex [Guaifenesin Er] Other (See Comments)    Severe headaches- "allergic," per MAR   Sulfa Antibiotics Other (See Comments)    headaches   Sulfa Antibiotics Other (See Comments)    Headaches- "allergic," per [facility]    Sulfonamide Derivatives Other (See Comments)    Headaches     Patient Measurements: Height: 5\' 8"  (172.7 cm) Weight: 93.2 kg (205 lb 6.4 oz) IBW/kg (Calculated) : 63.9 Heparin Dosing Weight: 84.9 kg   Vital Signs: Temp: 97.5 F (36.4 C) (02/07 0424) Temp Source: Oral (02/07 0424) BP: 140/86 (02/07 0424) Pulse Rate: 84 (02/07 0424)  Labs: Recent Labs    06/30/21 0248 07/01/21 0645 07/02/21 0310  HGB 11.5* 11.7*  --   HCT 36.0 36.4  --   PLT 309 330  --   LABPROT 29.4* 30.1* 30.9*  INR 2.8* 2.9* 3.0*  CREATININE 1.52* 1.37*  --      Estimated Creatinine Clearance: 45.6 mL/min (A) (by C-G formula based on SCr of 1.37 mg/dL (H)).   Medical History: Past Medical History:  Diagnosis Date   ANEMIA 05/28/2010   AORTIC VALVE REPLACEMENT, HX OF 03/11/2010   Mechanical prosthesis   Ascending aortic aneurysm    Atrial fibrillation (HCC)    Atrial flutter ([place])    BIPOLAR AFFECTIVE DISORDER 03/11/2010   Bradycardia    Chronic diastolic CHF (congestive heart failure) (HCC)    Depression    Diabetes ([place])    Diverticulitis 12/18/2010   Falls    FIBROIDS, UTERUS 03/11/2010   Gout 09/04/2016   Grave's disease 12/2010   Hyperlipidemia associated with type 2 diabetes mellitus ([place]) 06/05/2016   Hypertension    Low back pain 11/02/2017   Migraine 06/05/2016   Mixed hyperlipidemia 03/11/2010    Obesity    Pacemaker    Prolonged QT interval    SAH (subarachnoid hemorrhage) ([place])    Skin cancer    Subdural hematoma    Syncope 08/22/2019   UTI (lower urinary tract infection) 08/14/2011    Assessment: 69 YOF with h/o recurrent AF and h/o mechanical AV valve replacement on warfarin at facility. Pharmacy consulted to resume warfarin. Per facility records, patient's last dose of warfarin was on 1/31.  Warfarin regimen PTA: 3.5 mg daily (recently reduced from 4mg  daily), total weekly dose 24.5mg    INR remains therapeutic at 3.0, CBC stable.  Goal of Therapy:  INR 2.5-3.5  Monitor platelets by anticoagulation protocol: Yes   Plan:  -Warfarin 3.5 mg x 1 dose tonight  -Daily INR  Arrie Senate, PharmD, BCPS, [facility] Clinical Pharmacist [phone] Please check AMION for all [facility] Pharmacy numbers 07/02/2021

## 2021-07-02 NOTE — Progress Notes (Signed)
PROGRESS NOTE                                                                                                                                                                                                             Patient Demographics:    [name], is a 71 y.o. female, DOB - 25-Aug-1950, CHE:[id number]  Outpatient Primary MD for the patient is Mosie Lukes, MD    LOS - 5  Admit date - 06/27/2021    Chief Complaint  Patient presents with   Shortness of Breath            Brief Narrative (HPI from H&P)  71 y.o. female, with history of mechanical Saint Jude aortic valve placed in 2011, paroxysmal atrial fibrillation [place] vas 2 score of greater than 5 with embolic strokes, multiple falls with subdural bleed requiring craniotomy, DM type II, bipolar disorder, migraine, hypertension, syncope, tachybradycardia syndrome s/p pacemaker placement, dyslipidemia, who lives in a nursing home and presented with shortness of breath and was admitted with a diagnosis of CHF.   Subjective:   Patient in bed, appears comfortable, denies any headache, no fever, no chest pain or pressure, no shortness of breath , no abdominal pain. No new focal weakness.   Assessment  & Plan :    Assessment and Plan: No notes have been filed under this hospital service. Service: Hospitalist    1.  Acute hypoxic respiratory failure brought on by acute on chronic systolic and diastolic heart failure EF now percent down from 50% few years ago.  She also dropped a few doses of Lasix at SNF due to some miscommunication, no chest pain or acute EKG changes.  She has responded very well to IV Lasix with 3-1/2 to 4 L of diuresis , shortness of breath much improved from BiPAP she is down to room air and exam shows little to no fluid overload on exam on 06/30/2021.  Lasix dosed by cardiology.  Case discussed with the cardiologist on 07/02/2021, monitor with increased  dose Coreg and ARB which was started few days ago, no Lasix today, do not discharge today per cardiology continue to monitor.  Drop in EF could be due to uncontrolled A. fib.  Postdischarge will require close follow-up with Dr. Daneen Schick her primary cardiologist    2.  Paroxysmal atrial fibrillation with  [place] vas 2 score of greater than 5 with history of embolic strokes.  Coreg and Coumadin combination to be continued, pharmacy monitoring INR.  Note Coreg dose doubled on 07/02/2021 by cardiology.   3.  History of embolic strokes due to A. fib with multiple falls and subdural hematoma and bleeds.  Supportive care, PT OT high fall risk.  Post discharge back to SNF.   4.  Dyslipidemia.  Continue home dose statin   5.  Hypothyroidism.  Continue home dose Synthroid.   6.  Hypertension.  Continue Coreg and diuresis with Lasix and monitor.   7.  GERD.  On PPI continue.   8.  CKD 3A.  Baseline creatinine between baseline of 1.2 and 1.4.  Monitor with addition of Cozaar.  Diuretics per cardiology.   9.  History of mechanical Saint Jude aortic valve placed in 2011.  Continue with supportive care.  She is on Coumadin but most likely due to A. fib with embolic strokes.   10.  History of migraine headaches.  Monitor with supportive care.   11.  History of delirium.  She seems to be on extremely high doses of Seroquel, will have pharmacy reconcile her home medications and then address.  Will be at risk for encephalopathy in the hospital setting.  Will follow fall precautions and aspiration precautions while here, minimize narcotics and benzodiazepine use.  12.  Hypokalemia.  Replaced.       Condition - Fair  Family Communication  :  Daughter Amy [phone] 06/27/21, 06/28/21, 06/29/2021, 07/01/2021  Code Status :  Full  Consults  :  Cards  PUD Prophylaxis :    Procedures  :     TTE - 1. EF has decreased since echo done 2021 . Left ventricular ejection fraction, by estimation, is 25 to 30%. The  left ventricle has severely decreased function. The left ventricle demonstrates global hypokinesis. The left ventricular internal cavity size  was moderately dilated. Left ventricular diastolic parameters are indeterminate.  2. Pacing wires in RV/RA. Right ventricular systolic function is normal. The right ventricular size is normal. There is mildly elevated pulmonary artery systolic pressure.  3. Left atrial size was moderately dilated.  4. The mitral valve is degenerative. Mild mitral valve regurgitation. No evidence of mitral stenosis. Moderate mitral annular calcification.  5. Tricuspid valve regurgitation is mild to moderate.  6. Post AVR with 25 mm St Jude trivial closing volume central AR low gradients normal function . The aortic valve has been repaired/replaced. Aortic valve regurgitation is trivial. No aortic stenosis is present. There is a 25 mm St. Jude mechanical valve present in the aortic position.  7. The inferior vena cava is normal in size with greater than 50% respiratory variability, suggesting right atrial pressure of 3 mmHg.      Disposition Plan  :    Status is: Inpatient - CHF  DVT Prophylaxis  :    Place and maintain sequential compression device Start: 06/27/21 1645 warfarin (COUMADIN) tablet 3.5 mg    Lab Results  Component Value Date   PLT 330 07/01/2021    Diet :  Diet Order             DIET SOFT Room service appropriate? Yes; Fluid consistency: Thin; Fluid restriction: 1500 mL Fluid  Diet effective now                    Inpatient Medications  Scheduled Meds:  carvedilol  12.5 mg Oral BID WC  cholecalciferol  1,000 Units Oral Daily   docusate sodium  100 mg Oral BID   fenofibrate  160 mg Oral Daily   lamoTRIgine  100 mg Oral Daily   lamoTRIgine  200 mg Oral QHS   levothyroxine  25 mcg Oral QAC breakfast   losartan  25 mg Oral Daily   pantoprazole  40 mg Oral Daily   rosuvastatin  40 mg Oral Daily   sodium chloride flush  3 mL Intravenous  Q12H   cyanocobalamin  1,000 mcg Oral Daily   warfarin  3.5 mg Oral ONCE-1600   Warfarin - Pharmacist Dosing Inpatient   Does not apply q1600   Continuous Infusions: PRN Meds:.acetaminophen **OR** acetaminophen, albuterol, bisacodyl, hydrALAZINE, melatonin, ondansetron **OR** ondansetron (ZOFRAN) IV, SUMAtriptan  Antibiotics  :    Anti-infectives (From admission, onward)    None        Time Spent in minutes  30   Lala Lund M.D on 07/02/2021 at 11:25 AM  To page go to [email or web address]   [facility] -  Office  [phone]  See all Orders from today for further details    Objective:   Vitals:   07/01/21 2013 07/02/21 0417 07/02/21 0424 07/02/21 0942  BP: 119/82  140/86 121/66  Pulse: 72  84 89  Resp: 20  18 20   Temp: 99 F (37.2 C)  (!) 97.5 F (36.4 C) 97.8 F (36.6 C)  TempSrc: Oral  Oral Oral  SpO2: 96%  96% 94%  Weight:  93.2 kg    Height:        Wt Readings from Last 3 Encounters:  07/02/21 93.2 kg  06/24/21 95.3 kg  01/02/21 100.1 kg     Intake/Output Summary (Last 24 hours) at 07/02/2021 1125 Last data filed at 07/02/2021 0900 Gross per 24 hour  Intake 340 ml  Output 450 ml  Net -110 ml     Physical Exam  Awake Alert, No new F.N deficits, Normal affect [place].AT,PERRAL Supple Neck, No JVD,   Symmetrical Chest wall movement, Good air movement bilaterally, CTAB RRR,No Gallops, Rubs or new Murmurs,  +ve B.Sounds, Abd Soft, No tenderness,   No Cyanosis, Clubbing or edema     Data Review:    CBC Recent Labs  Lab 06/27/21 1443 06/27/21 1503 06/27/21 1504 06/28/21 0337 06/29/21 0242 06/30/21 0248 07/01/21 0645  WBC 8.2  --   --  7.4 7.7 6.5 7.7  HGB 10.6*   < > 11.6* 10.3* 11.3* 11.5* 11.7*  HCT 33.1*   < > 34.0* 32.1* 35.5* 36.0 36.4  PLT 282  --   --  250 322 309 330  MCV 94.0  --   --  92.5 93.2 93.5 92.9  MCH 30.1  --   --  29.7 29.7 29.9 29.8  MCHC 32.0  --   --  32.1 31.8 31.9 32.1  RDW 15.7*  --   --  15.3 15.3 14.9 14.9   LYMPHSABS 1.1  --   --  1.8 1.9 2.0 1.7  MONOABS 0.4  --   --  0.5 0.8 0.8 0.8  EOSABS 0.2  --   --  0.2 0.2 0.2 0.2  BASOSABS 0.1  --   --  0.1 0.1 0.1 0.1   < > = values in this interval not displayed.    Electrolytes Recent Labs  Lab 06/27/21 1446 06/27/21 1503 06/27/21 1835 06/28/21 0786 06/29/21 0242 06/30/21 0248 07/01/21 0645 07/02/21 0310 07/02/21 0558  NA  --    < >  --  138 141 140 139  --  140  K  --    < >  --  3.4* 4.3 3.9 4.2  --  3.9  CL  --    < >  --  101 100 103 103  --  105  CO2  --   --   --  28 29 27 26   --  26  GLUCOSE  --    < >  --  110* 143* 146* 136*  --  123*  BUN  --    < >  --  20 31* 40* 32*  --  33*  CREATININE  --    < >  --  1.21* 1.40* 1.52* 1.37*  --  1.32*  CALCIUM  --   --   --  8.7* 10.3 10.3 9.7  --  9.6  AST  --   --   --  18 18 18 20   --   --   ALT  --   --   --  12 11 10 13   --   --   ALKPHOS  --   --   --  26* 33* 32* 30*  --   --   BILITOT  --   --   --  1.0 0.8 0.6 0.6  --   --   ALBUMIN  --   --   --  3.6 4.0 3.7 4.0  --   --   MG  --   --   --  1.8 2.1 2.0 1.8  --  1.7  CRP  --   --  0.6  --   --   --   --   --   --   INR  --    < > 2.7* 2.8* 2.7* 2.8* 2.9* 3.0*  --   TSH  --   --  2.578  --   --   --   --   --   --   BNP 854.4*  --   --  743.6* 432.2* 263.0* 273.4*  --   --    < > = values in this interval not displayed.    ------------------------------------------------------------------------------------------------------------------ No results for input(s): CHOL, HDL, LDLCALC, TRIG, CHOLHDL, LDLDIRECT in the last 72 hours.  Lab Results  Component Value Date   HGBA1C 4.8 07/24/2020    No results for input(s): TSH, T4TOTAL, T3FREE, THYROIDAB in the last 72 hours.  Invalid input(s): FREET3  ------------------------------------------------------------------------------------------------------------------ ID Labs Recent Labs  Lab 06/27/21 1443 06/27/21 1504 06/27/21 1835 06/28/21 0337 06/29/21 0242  06/30/21 0248 07/01/21 0645 07/02/21 0558  WBC 8.2  --   --  7.4 7.7 6.5 7.7  --   PLT 282  --   --  250 322 309 330  --   CRP  --   --  0.6  --   --   --   --   --   CREATININE 1.11*   < >  --  1.21* 1.40* 1.52* 1.37* 1.32*   < > = values in this interval not displayed.   Cardiac Enzymes No results for input(s): CKMB, TROPONINI, MYOGLOBIN in the last 168 hours.  Invalid input(s): CK   Radiology Reports ECHOCARDIOGRAM COMPLETE  Result Date: 06/28/2021    ECHOCARDIOGRAM REPORT   Patient Name:   SAI MOURA Date of Exam: 06/28/2021 Medical Rec #:  [id number]     Height:       68.0 in Accession #:  [id number]    Weight:       207.0 lb Date of Birth:  1951-02-24      BSA:          2.074 m Patient Age:    31 years      BP:           108/75 mmHg Patient Gender: F             HR:           102 bpm. Exam Location:  Inpatient Procedure: 2D Echo, 3D Echo, Intracardiac Opacification Agent, Cardiac Doppler            and Color Doppler Indications:    I50.40* Unspecified combined systolic (congestive) and diastolic                 (congestive) heart failure  History:        Patient has prior history of Echocardiogram examinations, most                 recent 03/28/2020. Abnormal ECG and Pacemaker, Aortic Valve                 Disease, Arrythmias:Tachycardia, Bradycardia and Atrial                 Fibrillation, Signs/Symptoms:Syncope and Altered Mental Status;                 Risk Factors:Dyslipidemia and Hypertension.                 Aortic Valve: 25 mm St. Jude mechanical valve is present in the                 aortic position.  Sonographer:    Roseanna Rainbow RDCS Referring Phys: [id number] [facility]  Sonographer Comments: Technically difficult study due to poor echo windows. Image acquisition challenging due to patient body habitus. IMPRESSIONS  1. EF has decreased since echo done 2021 . Left ventricular ejection fraction, by estimation, is 25 to 30%. The left ventricle has severely decreased function. The left  ventricle demonstrates global hypokinesis. The left ventricular internal cavity size  was moderately dilated. Left ventricular diastolic parameters are indeterminate.  2. Pacing wires in RV/RA. Right ventricular systolic function is normal. The right ventricular size is normal. There is mildly elevated pulmonary artery systolic pressure.  3. Left atrial size was moderately dilated.  4. The mitral valve is degenerative. Mild mitral valve regurgitation. No evidence of mitral stenosis. Moderate mitral annular calcification.  5. Tricuspid valve regurgitation is mild to moderate.  6. Post AVR with 25 mm St Jude trivial closing volume central AR low gradients normal function . The aortic valve has been repaired/replaced. Aortic valve regurgitation is trivial. No aortic stenosis is present. There is a 25 mm St. Jude mechanical valve present in the aortic position.  7. The inferior vena cava is normal in size with greater than 50% respiratory variability, suggesting right atrial pressure of 3 mmHg. FINDINGS  Left Ventricle: EF has decreased since echo done 2021. Left ventricular ejection fraction, by estimation, is 25 to 30%. The left ventricle has severely decreased function. The left ventricle demonstrates global hypokinesis. Definity contrast agent was given IV to delineate the left ventricular endocardial borders. The left ventricular internal cavity size was moderately dilated. There is no left ventricular hypertrophy. Left ventricular diastolic parameters are indeterminate. Right Ventricle: Pacing wires in RV/RA. The right ventricular size is normal. No increase in right ventricular  wall thickness. Right ventricular systolic function is normal. There is mildly elevated pulmonary artery systolic pressure. The tricuspid regurgitant velocity is 2.69 m/s, and with an assumed right atrial pressure of 8 mmHg, the estimated right ventricular systolic pressure is 02.7 mmHg. Left Atrium: Left atrial size was moderately  dilated. Right Atrium: Right atrial size was normal in size. Pericardium: There is no evidence of pericardial effusion. Mitral Valve: The mitral valve is degenerative in appearance. There is moderate thickening of the mitral valve leaflet(s). There is moderate calcification of the mitral valve leaflet(s). Moderate mitral annular calcification. Mild mitral valve regurgitation. No evidence of mitral valve stenosis. MV peak gradient, 6.8 mmHg. The mean mitral valve gradient is 3.5 mmHg. Tricuspid Valve: The tricuspid valve is normal in structure. Tricuspid valve regurgitation is mild to moderate. No evidence of tricuspid stenosis. Aortic Valve: Post AVR with 25 mm St Jude trivial closing volume central AR low gradients normal function. The aortic valve has been repaired/replaced. Aortic valve regurgitation is trivial. No aortic stenosis is present. Aortic valve mean gradient measures 6.0 mmHg. Aortic valve peak gradient measures 11.2 mmHg. Aortic valve area, by VTI measures 2.65 cm. There is a 25 mm St. Jude mechanical valve present in the aortic position. Pulmonic Valve: The pulmonic valve was normal in structure. Pulmonic valve regurgitation is not visualized. No evidence of pulmonic stenosis. Aorta: The aortic root is normal in size and structure. Venous: The inferior vena cava is normal in size with greater than 50% respiratory variability, suggesting right atrial pressure of 3 mmHg. IAS/Shunts: No atrial level shunt detected by color flow Doppler. Additional Comments: A device lead is visualized.  LEFT VENTRICLE PLAX 2D LVIDd:         6.00 cm LVIDs:         5.10 cm LV PW:         1.00 cm LV IVS:        1.00 cm LVOT diam:     2.00 cm LV SV:         58 LV SV Index:   28 LVOT Area:     3.14 cm  RIGHT VENTRICLE            IVC RV S prime:     6.90 cm/s  IVC diam: 1.95 cm TAPSE (M-mode): 1.5 cm LEFT ATRIUM              Index        RIGHT ATRIUM           Index LA diam:        4.40 cm  2.12 cm/m   RA Area:     16.40  cm LA Vol (A2C):   122.0 ml 58.83 ml/m  RA Volume:   45.60 ml  21.99 ml/m LA Vol (A4C):   83.8 ml  40.41 ml/m LA Biplane Vol: 105.0 ml 50.63 ml/m  AORTIC VALVE AV Area (Vmax):    2.39 cm AV Area (Vmean):   2.19 cm AV Area (VTI):     2.65 cm AV Vmax:           167.00 cm/s AV Vmean:          110.500 cm/s AV VTI:            0.218 m AV Peak Grad:      11.2 mmHg AV Mean Grad:      6.0 mmHg LVOT Vmax:         127.00 cm/s LVOT Vmean:  77.100 cm/s LVOT VTI:          0.184 m LVOT/AV VTI ratio: 0.84  AORTA Ao Root diam: 3.10 cm Ao Asc diam:  3.00 cm MITRAL VALVE                  TRICUSPID VALVE MV Area (PHT): 3.92 cm       TR Peak grad:   28.9 mmHg MV Area VTI:   2.23 cm       TR Vmax:        269.00 cm/s MV Peak grad:  6.8 mmHg MV Mean grad:  3.5 mmHg       SHUNTS MV Vmax:       1.30 m/s       Systemic VTI:  0.18 m MV Vmean:      88.8 cm/s      Systemic Diam: 2.00 cm MV Decel Time: 194 msec MR Peak grad:    102.8 mmHg MR Mean grad:    57.0 mmHg MR Vmax:         507.00 cm/s MR Vmean:        341.0 cm/s MR PISA:         0.57 cm MR PISA Eff ROA: 4 mm MR PISA Radius:  0.30 cm MV E velocity: 131.00 cm/s Jenkins Rouge MD Electronically signed by Jenkins Rouge MD Signature Date/Time: 06/28/2021/12:17:05 PM    Final

## 2021-07-02 NOTE — Progress Notes (Addendum)
Progress Note  Patient Name: [name] Date of Encounter: 07/02/2021  [facility] Cardiologist: Sinclair Grooms, MD  EP: Dr. Lovena Le  Subjective   HR went up with ambulation, now coming down with rest. No chest pain.   Inpatient Medications    Scheduled Meds:  carvedilol  6.25 mg Oral BID WC   cholecalciferol  1,000 Units Oral Daily   docusate sodium  100 mg Oral BID   fenofibrate  160 mg Oral Daily   lamoTRIgine  100 mg Oral Daily   lamoTRIgine  200 mg Oral QHS   levothyroxine  25 mcg Oral QAC breakfast   losartan  25 mg Oral Daily   pantoprazole  40 mg Oral Daily   rosuvastatin  40 mg Oral Daily   sodium chloride flush  3 mL Intravenous Q12H   cyanocobalamin  1,000 mcg Oral Daily   warfarin  3.5 mg Oral ONCE-1600   Warfarin - Pharmacist Dosing Inpatient   Does not apply q1600   Continuous Infusions:  PRN Meds: acetaminophen **OR** acetaminophen, albuterol, bisacodyl, hydrALAZINE, melatonin, ondansetron **OR** ondansetron (ZOFRAN) IV, SUMAtriptan   Vital Signs    Vitals:   07/01/21 1706 07/01/21 2013 07/02/21 0417 07/02/21 0424  BP: 130/64 119/82  140/86  Pulse: 87 72  84  Resp:  20  18  Temp:  99 F (37.2 C)  (!) 97.5 F (36.4 C)  TempSrc:  Oral  Oral  SpO2:  96%  96%  Weight:   93.2 kg   Height:        Intake/Output Summary (Last 24 hours) at 07/02/2021 0920 Last data filed at 07/02/2021 0426 Gross per 24 hour  Intake 100 ml  Output 450 ml  Net -350 ml   Last 3 Weights 07/02/2021 06/30/2021 06/29/2021  Weight (lbs) 205 lb 6.4 oz 203 lb 12.8 oz 202 lb  Weight (kg) 93.169 kg 92.443 kg 91.627 kg      Telemetry    Atrial fibrillation 70s, this morning it went up to 110s - Personally Reviewed  ECG    N/a  Physical Exam   GEN: No acute distress.   Neck: No JVD Cardiac: Irregular, Scrip murmurs, rubs, or gallops.  Respiratory: Clear to auscultation bilaterally. GI: Soft, nontender, non-distended  MS: No edema; No deformity. Neuro:  Nonfocal   Psych: Normal affect   Labs    High Sensitivity Troponin:   Recent Labs  Lab 06/27/21 1443  TROPONINIHS 54*     Chemistry Recent Labs  Lab 06/29/21 0242 06/30/21 0248 07/01/21 0645 07/02/21 0558  NA 141 140 139 140  K 4.3 3.9 4.2 3.9  CL 100 103 103 105  CO2 29 27 26 26   GLUCOSE 143* 146* 136* 123*  BUN 31* 40* 32* 33*  CREATININE 1.40* 1.52* 1.37* 1.32*  CALCIUM 10.3 10.3 9.7 9.6  MG 2.1 2.0 1.8 1.7  PROT 7.3 6.8 7.1  --   ALBUMIN 4.0 3.7 4.0  --   AST 18 18 20   --   ALT 11 10 13   --   ALKPHOS 33* 32* 30*  --   BILITOT 0.8 0.6 0.6  --   GFRNONAA 40* 37* 42* 43*  ANIONGAP 12 10 10 9     Hematology Recent Labs  Lab 06/29/21 0242 06/30/21 0248 07/01/21 0645  WBC 7.7 6.5 7.7  RBC 3.81* 3.85* 3.92  HGB 11.3* 11.5* 11.7*  HCT 35.5* 36.0 36.4  MCV 93.2 93.5 92.9  MCH 29.7 29.9 29.8  MCHC  31.8 31.9 32.1  RDW 15.3 14.9 14.9  PLT 322 309 330   Thyroid  Recent Labs  Lab 06/27/21 1835  TSH 2.578    BNP Recent Labs  Lab 06/29/21 0242 06/30/21 0248 07/01/21 0645  BNP 432.2* 263.0* 273.4*    DDimer No results for input(s): DDIMER in the last 168 hours.   Radiology    No results found.  Cardiac Studies   Echo  1. EF has decreased since echo done 2021 . Left ventricular ejection  fraction, by estimation, is 25 to 30%. The left ventricle has severely  decreased function. The left ventricle demonstrates global hypokinesis.  The left ventricular internal cavity size   was moderately dilated. Left ventricular diastolic parameters are  indeterminate.   2. Pacing wires in RV/RA. Right ventricular systolic function is normal.  The right ventricular size is normal. There is mildly elevated pulmonary  artery systolic pressure.   3. Left atrial size was moderately dilated.   4. The mitral valve is degenerative. Mild mitral valve regurgitation. No  evidence of mitral stenosis. Moderate mitral annular calcification.   5. Tricuspid valve regurgitation is mild  to moderate.   6. Post AVR with 25 mm St Jude trivial closing volume central AR low  gradients normal function . The aortic valve has been repaired/replaced.  Aortic valve regurgitation is trivial. No aortic stenosis is present.  There is a 25 mm St. Jude mechanical  valve present in the aortic position.   7. The inferior vena cava is normal in size with greater than 50%  respiratory variability, suggesting right atrial pressure of 3 mmHg.   Patient Profile     71 y.o. female with a hx of bicuspid AV/AS with ascending aortic aneurysm s/p resection/grafting of ascending aortic aneurysm and placement of St. Jude mechanical AV conduit/[place] 2009, atrial fibrillation/flutter, bradycardia s/p Bos Sci PPM implantation 12/2019, multiple falls with prior craniotomy for subdural hematoma/subarachnoid hemorrhage, prior chronic infarcts on MRI 2021, bipolar disorder, chronic diastolic CHF (prior LVEF 82-42% in 2021, improved to 50-55% in 03/2020), anemia, depression, debilitation (wheelchair bound), DM, HLD, HTN, mild carotid artery disease 2021, back pain, obesity who is being seen  for the evaluation of CHF at the request of Benedetto Goad PA-C.  Assessment & Plan    Acute combined CHF - 3 weeks hx of progressive worsening dyspnea with edema. Elevated JVD on arrival. Initially required BiPAP in the ED. BNP 854 >> 743. Chest x-ray showed interval increase in mild to moderate interstitial thickening felt to likely represent mild to moderate interstitial pulmonary edema. - Echo showed new depressed LVEF to 25-30% and grade 2 DD - Given IV lasix x 3 with bump in scr to 1.52 which improved with holding lasix - Now appears euvolemic - Suspected tachy mediated cardiomyopathy. HR improving with titration of BB.  - Increase coreg to 12.5mg  BID - Continue Losartan 25mg  Qd - Consider Sprio/SGLT2 inhibitor as outpatient - She will need PRN or every few days lasix -Could consider outpatient stress test versus  repeat echocardiogram and then further evaluation.  2. Atrial fibrillation  -Suspect permanent.  Patient reported intermittent palpitation.  Heart rate was elevated in 120s upon arrival with volume overload.  She was started on carvedilol and uptitrated.  This morning her heart rate went to 110s with minimal ambulation in room.  We will increase carvedilol to 12.5 mg twice daily. -On Coumadin for anticoagulation -Suspected tachycardia mediated cardiomyopathy.  She will need follow-up with EP.  3.  S/p AVR -Reassuring echocardiogram -Continue warfarin  4.  Chronic kidney disease stage III -Baseline creatinine fluctuating between 1.1-1.4 in past 1 year.  Creatinine bump to 1.52 this admission, now trending down.  Creatinine of 1.32 today.   For questions or updates, please contact [place] Please consult [email or web address] for contact info under        Signed, Leanor Kail, PA  07/02/2021, 9:20 AM     Attending Note:   The patient was seen and examined.  Agree with assessment and plan as noted above.  Changes made to the above note as needed.  Patient seen and independently examined with Robbie Lis, PA .   We discussed all aspects of the encounter. I agree with the assessment and plan as stated above.   Acute on chronic combined congestive heart failure.  Her ejection fraction is 25 to 30%.  I suspect this is rate related.  We will continue to gradually titrate up her carvedilol to achieve better rate control.  She seems to be about euvolemic.  She will likely need to go home on PRN Lasix and potassium . I discussed the plan with her daughter, AMy by phone.   She is likely going to need several more day s Eventually we will need to consider an ischemic evaluation .   Currently she is unable to lie flat so a cath would not be possible at this time.   2.  Atrial fib :   likely permanent .  Cont coumadin Rate is still a bit rapid.  Cont to increase her coreg.    I have spent a  total of 40 minutes with patient reviewing hospital  notes , telemetry, EKGs, labs and examining patient as well as establishing an assessment and plan that was discussed with the patient.  > 50% of time was spent in direct patient care.    Thayer Headings, Brooke Bonito., MD, [facility] 07/02/2021, 10:00 AM [address],  [place] Pager [phone]

## 2021-07-02 NOTE — Plan of Care (Signed)
°  Problem: Education: Goal: Ability to demonstrate management of disease process will improve Outcome: Progressing   Problem: Cardiac: Goal: Ability to achieve and maintain adequate cardiopulmonary perfusion will improve Outcome: Progressing   Problem: Education: Goal: Knowledge of General Education information will improve Description: Including pain rating scale, medication(s)/side effects and non-pharmacologic comfort measures Outcome: Progressing   Problem: Clinical Measurements: Goal: Ability to maintain clinical measurements within normal limits will improve Outcome: Progressing   Problem: Clinical Measurements: Goal: Diagnostic test results will improve Outcome: Progressing   Problem: Clinical Measurements: Goal: Cardiovascular complication will be avoided Outcome: Progressing   Problem: Safety: Goal: Ability to remain free from injury will improve Outcome: Progressing

## 2021-07-03 DIAGNOSIS — E669 Obesity, unspecified: Secondary | ICD-10-CM

## 2021-07-03 DIAGNOSIS — E039 Hypothyroidism, unspecified: Secondary | ICD-10-CM

## 2021-07-03 DIAGNOSIS — J9601 Acute respiratory failure with hypoxia: Secondary | ICD-10-CM | POA: Diagnosis not present

## 2021-07-03 DIAGNOSIS — R296 Repeated falls: Secondary | ICD-10-CM

## 2021-07-03 DIAGNOSIS — Z8673 Personal history of transient ischemic attack (TIA), and cerebral infarction without residual deficits: Secondary | ICD-10-CM

## 2021-07-03 DIAGNOSIS — E785 Hyperlipidemia, unspecified: Secondary | ICD-10-CM

## 2021-07-03 DIAGNOSIS — I10 Essential (primary) hypertension: Secondary | ICD-10-CM | POA: Diagnosis not present

## 2021-07-03 DIAGNOSIS — I4891 Unspecified atrial fibrillation: Secondary | ICD-10-CM | POA: Diagnosis not present

## 2021-07-03 DIAGNOSIS — E538 Deficiency of other specified B group vitamins: Secondary | ICD-10-CM

## 2021-07-03 DIAGNOSIS — E1169 Type 2 diabetes mellitus with other specified complication: Secondary | ICD-10-CM

## 2021-07-03 DIAGNOSIS — F3130 Bipolar disorder, current episode depressed, mild or moderate severity, unspecified: Secondary | ICD-10-CM

## 2021-07-03 DIAGNOSIS — N183 Chronic kidney disease, stage 3 unspecified: Secondary | ICD-10-CM

## 2021-07-03 DIAGNOSIS — K219 Gastro-esophageal reflux disease without esophagitis: Secondary | ICD-10-CM

## 2021-07-03 DIAGNOSIS — I5043 Acute on chronic combined systolic (congestive) and diastolic (congestive) heart failure: Secondary | ICD-10-CM | POA: Diagnosis not present

## 2021-07-03 DIAGNOSIS — N1831 Chronic kidney disease, stage 3a: Secondary | ICD-10-CM

## 2021-07-03 DIAGNOSIS — D631 Anemia in chronic kidney disease: Secondary | ICD-10-CM

## 2021-07-03 LAB — CBC WITH DIFFERENTIAL/PLATELET
Abs Immature Granulocytes: 0.01 10*3/uL (ref 0.00–0.07)
Basophils Absolute: 0.1 10*3/uL (ref 0.0–0.1)
Basophils Relative: 1 %
Eosinophils Absolute: 0.2 10*3/uL (ref 0.0–0.5)
Eosinophils Relative: 2 %
HCT: 34.2 % — ABNORMAL LOW (ref 36.0–46.0)
Hemoglobin: 10.7 g/dL — ABNORMAL LOW (ref 12.0–15.0)
Immature Granulocytes: 0 %
Lymphocytes Relative: 14 %
Lymphs Abs: 1 10*3/uL (ref 0.7–4.0)
MCH: 29.4 pg (ref 26.0–34.0)
MCHC: 31.3 g/dL (ref 30.0–36.0)
MCV: 94 fL (ref 80.0–100.0)
Monocytes Absolute: 0.6 10*3/uL (ref 0.1–1.0)
Monocytes Relative: 8 %
Neutro Abs: 5.1 10*3/uL (ref 1.7–7.7)
Neutrophils Relative %: 75 %
Platelets: 300 10*3/uL (ref 150–400)
RBC: 3.64 MIL/uL — ABNORMAL LOW (ref 3.87–5.11)
RDW: 14.6 % (ref 11.5–15.5)
WBC: 6.9 10*3/uL (ref 4.0–10.5)
nRBC: 0 % (ref 0.0–0.2)

## 2021-07-03 LAB — BASIC METABOLIC PANEL
Anion gap: 9 (ref 5–15)
BUN: 32 mg/dL — ABNORMAL HIGH (ref 8–23)
CO2: 26 mmol/L (ref 22–32)
Calcium: 9.5 mg/dL (ref 8.9–10.3)
Chloride: 105 mmol/L (ref 98–111)
Creatinine, Ser: 1.3 mg/dL — ABNORMAL HIGH (ref 0.44–1.00)
GFR, Estimated: 44 mL/min — ABNORMAL LOW (ref >60)
Glucose, Bld: 103 mg/dL — ABNORMAL HIGH (ref 70–99)
Potassium: 4.3 mmol/L (ref 3.5–5.1)
Sodium: 140 mmol/L (ref 135–145)

## 2021-07-03 LAB — PROTIME-INR
INR: 2.8 — ABNORMAL HIGH (ref 0.8–1.2)
Prothrombin Time: 29.7 seconds — ABNORMAL HIGH (ref 11.4–15.2)

## 2021-07-03 LAB — MAGNESIUM: Magnesium: 1.8 mg/dL (ref 1.7–2.4)

## 2021-07-03 MED ORDER — PHYTONADIONE 5 MG PO TABS
2.5000 mg | ORAL_TABLET | Freq: Once | ORAL | Status: AC
  Administered 2021-07-03: 2.5 mg via ORAL
  Filled 2021-07-03: qty 1

## 2021-07-03 MED ORDER — SODIUM CHLORIDE 0.9% FLUSH
3.0000 mL | Freq: Two times a day (BID) | INTRAVENOUS | Status: DC
  Administered 2021-07-03 – 2021-07-04 (×3): 3 mL via INTRAVENOUS

## 2021-07-03 MED ORDER — SODIUM CHLORIDE 0.9 % IV SOLN: INTRAVENOUS | Status: DC

## 2021-07-03 MED ORDER — MAGNESIUM SULFATE 2 GM/50ML IV SOLN
2.0000 g | Freq: Once | INTRAVENOUS | Status: AC
  Administered 2021-07-03: 2 g via INTRAVENOUS
  Filled 2021-07-03: qty 50

## 2021-07-03 MED ORDER — PROCHLORPERAZINE EDISYLATE 10 MG/2ML IJ SOLN
10.0000 mg | Freq: Four times a day (QID) | INTRAMUSCULAR | Status: DC | PRN
  Filled 2021-07-03: qty 2

## 2021-07-03 MED ORDER — ASPIRIN 81 MG PO CHEW
81.0000 mg | CHEWABLE_TABLET | ORAL | Status: AC
  Administered 2021-07-05: 81 mg via ORAL
  Filled 2021-07-03: qty 1

## 2021-07-03 MED ORDER — SODIUM CHLORIDE 0.9 % IV SOLN: 250.0000 mL | INTRAVENOUS | Status: DC | PRN

## 2021-07-03 MED ORDER — WARFARIN SODIUM 2.5 MG PO TABS: 3.5000 mg | ORAL_TABLET | Freq: Once | ORAL | Status: DC

## 2021-07-03 MED ORDER — SODIUM CHLORIDE 0.9% FLUSH: 3.0000 mL | INTRAVENOUS | Status: DC | PRN

## 2021-07-03 NOTE — Assessment & Plan Note (Addendum)
-  HR is improving and she had an ECHOCardiogram -Her Coumadin was being held and she is being started on a heparin drip given INR below her goal; INR is now 1.4 and Cardiology cleared patient for a cath and results as above; cardiology recommending resuming anticoagulation with heparin drip as well as Coumadin and if she gets discharged can likely be discharged on Lovenox injections and Coumadin -Continue to monitor on telemetry -Further care per Cardiology

## 2021-07-03 NOTE — Hospital Course (Addendum)
The patient is a 71 year old obese Caucasian female with a past medical history significant for breast admitted to [facility] aortic valve that was placed in 2011, PAF with CHA2DS2-VASc score of greater than 5 and history of embolic strokes and history of multiple falls with subdural bleeds requiring craniotomy, diabetes mellitus type 2, bipolar disorder, history of migraines, history of hypertension, hyperlipidemia, syncope, history of tachybradycardia syndrome status post pacemaker placement, as well as other comorbidities who lives in a nursing home and presented with shortness of breath and was admitted with a diagnosis of acute respiratory failure with hypoxia in the setting of acute on chronic systolic and diastolic CHF.  Cardiology was consulted and she was initiated on IV diuresis and responded well.  Patient continues to have some dyspnea and cardiology feels that since she is only improved slightly with diuresis and controlling heart rate there planning for right and left heart catheterization and this will be done on Friday 07/05/21.    Cardiology has initiated a heparin drip and she was given Vitamin K and Coumadin held INR dropped to 1.3.  PT OT recommending SNF.  Will need to monitor her symptoms as she was dizzy  and nauseous but is now improving.   She underwent Cath and it showed Widely patent coronary arteries with no significant stenoses, right dominant coronary circulation but did also show Elevated right heart pressures and large V waves in the pulmonary wedge tracing of 38 mmHg. Additionally there was mild pulmonary hypertension with mean pulmonary artery pressure 33 mmHg, suspect secondary to left heart disease (transpulmonary gradient only 8 mmHg) and the recommendation was for Diuresis and medical therapy for CHF.   Cardiology started the patient on p.o. Lasix 40 mg grams daily and recommended continuing carvedilol 12.5 mg p.o. twice daily.  They changed her losartan to Entresto 24-26  mg p.o. twice daily and added Farxiga 10 mg p.o. daily and recommended considering adding spironolactone as renal function remained stable in outpatient setting.  Cardiology recommend establishing the patient in the advanced heart failure clinic.   Patient improved significantly and she was deemed medically stable to be discharged.  Patient was able to be transitioned to Lovenox full dose injections as a bridge for her Coumadin as her INR still not therapeutic.  Discharge INR 1.4 she was told she needed to repeat her INR within 2 to 3 days at her facility and follow-up with cardiology in outpatient setting.  All questions were answered and patient felt well and back to her baseline.  She denied any shortness of breath.  She will follow-up with her PCP as well as her cardiologist in the outpatient setting.

## 2021-07-03 NOTE — Assessment & Plan Note (Signed)
-  Continue with Rosuvastatin 40 mg p.o. daily as well as Fenofibrate 160 mg p.o. daily

## 2021-07-03 NOTE — Assessment & Plan Note (Addendum)
-   She had acute hypoxic respiratory failure in setting of acute on chronic systolic and diastolic CHF with reduced EF from a few years ago -She missed some doses of Lasix at SNF due to some miscommunication but she denies any chest pain and did not have any acute EKG changes -IV Lasix has now stopped  -She had to be placed on BiPAP initially but is now weaned to room air -She was on Lasix per cardiology recommendations but this has been discontinued given that she is not volume overloaded on exam -SpO2: 97 % O2 Flow Rate (L/min): 3 L/min FiO2 (%): 40 %; no longer wearing supplemental oxygen via nasal cannula and has been off oxygen for 4 days -Dr. Candiss Norse discussed with the cardiologist yesterday and they increased her Coreg and ARB but now her ARB has been changed to [facility] -She will need to follow-up with Dr. Tamala Julian her primary cardiologist at discharge but cardiology is planning a cardiac catheterization this hospitalization and now done and recommending continued diuresis -Continuous pulse oximetry maintain O2 saturation greater than 90% -Repeat COVID Negative and CXR showed "No evidence of acute chest disease. Cardiomegaly with aortic valve prosthesis and pacemaker."

## 2021-07-03 NOTE — Assessment & Plan Note (Signed)
-  C/w Lamotrigine 100 mg p.o. daily and 200 mg p.o. nightly

## 2021-07-03 NOTE — Assessment & Plan Note (Signed)
-  Complicates overall prognosis and care -Estimated body mass index is 31.41 kg/m as calculated from the following:   Height as of this encounter: 5\' 8"  (1.727 m).   Weight as of this encounter: 93.7 kg.  -Weight Loss and Dietary Counseling given

## 2021-07-03 NOTE — Assessment & Plan Note (Addendum)
-  Has had embolic strokes due to A-fib with multiple falls and subdural hematoma and bleeds -Continue supportive care and PT OT feel that she is a high fall risk -She will be discharged back to SNF once medically stable and cleared by cardiology and she is and now since that she is getting discharged we will change her to Coumadin and Lovenox injections.

## 2021-07-03 NOTE — TOC Progression Note (Signed)
Transition of Care [facility]) - Progression Note    Patient Details  Name: [name] MRN: [id number] Date of Birth: 1951/02/12  Transition of Care [facility]) CM/SW [place], [place] Phone Number: 07/03/2021, 12:30 PM  Clinical Narrative:     Patient has SNF bed at [facility] when medically ready.CSW updated [facility] with [facility]. Patient not medically ready for dc today. CSW will continue to follow and assist with patients dc planning needs.  Expected Discharge Plan: [place] Barriers to Discharge: Continued Medical Work up  Expected Discharge Plan and Services Expected Discharge Plan: [place] arrangements for the past 2 months: [place]                                       Social Determinants of Health (SDOH) Interventions    Readmission Risk Interventions Readmission Risk Prevention Plan 05/03/2020  Transportation Screening Complete  Home Care Screening Complete  Medication Review (RN CM) Complete  Some recent data might be hidden

## 2021-07-03 NOTE — Progress Notes (Signed)
Progress Note   Patient: [name] ZOX:[id number] DOB: [birth year] DOA: 06/27/2021     6 DOS: the patient was seen and examined on 07/03/2021   Brief hospital course: The patient is a 71 year old obese Caucasian female with a past medical history significant for breast admitted to [facility] aortic valve that was placed in 2011, PAF with CHA2DS2-VASc score of greater than 5 and history of embolic strokes and history of multiple falls with subdural bleeds requiring craniotomy, diabetes mellitus type 2, bipolar disorder, history of migraines, history of hypertension, hyperlipidemia, syncope, history of tachybradycardia syndrome status post pacemaker placement, as well as other comorbidities who lives in a nursing home and presented with shortness of breath and was admitted with a diagnosis of acute respiratory failure with hypoxia in the setting of acute on chronic systolic and diastolic CHF.  Cardiology was consulted and she was initiated on IV diuresis and responded well.  Patient continues to have some dyspnea and cardiology feels that since she is only improved slightly with diuresis and controlling heart rate there planning for right and left heart catheterization and this will be done on Friday.  They have initiated a heparin drip.  Her Coumadin level given her vitamin K 2.5 mg p.o. x1.  PT OT recommending SNF.  Will need to monitor her symptoms as she was dizzy today and nauseous.  Assessment and Plan: * Acute hypoxemic respiratory failure ([place])- (present on admission) - She had acute hypoxic respiratory failure in setting of acute on chronic systolic and diastolic CHF with reduced EF from a few years ago -She missed some doses of Lasix at SNF due to some miscommunication but she denies any chest pain and did not have any acute EKG changes -She is given IV Lasix with approximately 3-1/2-4 point liters of diuresis and her shortness of breath is much improved. -She had to be placed on BiPAP initially  but is now weaned to room air -Her diuresis has now stopped and she says no fluid on examination -Continue Lasix by cardiology but this is been held -SpO2: 97 % O2 Flow Rate (L/min): 3 L/min FiO2 (%): 40 %; no longer wearing supplemental oxygen via nasal cannula -Dr. Candiss Norse discussed with the cardiologist yesterday and they increased her Coreg and ARB -She will need to follow-up with Dr. Tamala Julian her primary cardiologist at discharge but cardiology is planning a cardiac catheterization this hospitalization  Obesity (BMI 81-19.1) -Complicates overall prognosis and care -Estimated body mass index is 31.41 kg/m as calculated from the following:   Height as of this encounter: 5\' 8"  (1.727 m).   Weight as of this encounter: 93.7 kg.  -Weight Loss and Dietary Counseling given   Anemia of chronic kidney failure, stage 3 (moderate) (HCC) -Patient's Hb/Hct went from 11.7/36.4 -> 10.7/34.2 -Check anemia panel in the a.m. -Continue to monitor for signs and symptoms of bleeding as she is anticoagulated and being transitioned to the heparin drip; no overt bleeding noted -Repeat CBC in the a.m.  Stage 3a chronic kidney disease (CKD) (HCC) -Patient's baseline creatinine is between 1.2 and 1.4 -BUN/creatinine went from 40/1.52 -> 33/1.32 -> 32/1.30 -Continue monitor renal function as she is now given the addition of Cozaar -Avoid nephrotoxic medications, contrast dyes, hypotension and renally dose medications -CMP in a.m.  History of CVA (cerebrovascular accident) - Has had embolic strokes due to A-fib with multiple falls and subdural hematoma and bleeds -Continue supportive care and PT OT feel that she is a high fall risk -She  will be discharged back to SNF once medically stable and cleared by cardiology   S/P AVR - Had a repeat echocardiogram done which showed a post AVR with a 25 mm Saint Jude trivial closing volume central AR low-grade and normal function -Her aortic valve regurgitation is  trivial and her aortic stenosis is present -Cardiology evaluated and felt that this was reassuring for her  Hypothyroidism- (present on admission) -C/w Levothyroxine 25 mcg p.o. daily  Bipolar disorder ([place])- (present on admission) -C/w Lamotrigine 100 mg p.o. daily and 200 mg p.o. nightly  Atrial fibrillation and flutter ([place])- (present on admission) -HR is improving and she had an ECHOCardiogram -Her Coumadin is being held and she is being started on a heparin drip -Continue to monitor on telemetry -Cardiology is evaluating and her A-fib is in the 70s up to the 110s 120s this morning -continue with carvedilol 12.5 mg p.o. twice daily -Further care per cardiology  GERD without esophagitis- (present on admission) -Continue with pantoprazole 40 mg p.o. daily  Essential hypertension- (present on admission) -Continue with Carvedilol 12.5 mils p.o. twice daily as well as 25 mg of losartan p.o. daily -Continue with hydralazine 10 mg IV every 6 as needed for systolic blood pressure greater than 150 -Continue to monitor blood pressures per protocol -Last blood pressure reading was 115/67  Acute on chronic combined systolic and diastolic CHF (congestive heart failure) ([place])- (present on admission) - Repeat echocardiogram done here showed an LVEF of 25 to 30% with severely decreased function in the left ventricle demonstrates global hypokinesis and left ventricular internal cavity size is moderately dilated with the ventricular diastolic parameters being indeterminate -She had 3 weeks of progressively worsening dyspnea with edema -Had elevated JVP on arrival and initially required BiPAP -BNP was elevated at 854.4 on admission and trended down to 273.4 -Currently getting diuresis and has improving slightly -INR is 2.8 and cardiology is planning for right and left heart catheterization Friday and recommending stopping Coumadin, given vitamin K 2.5 mg p.o. today and starting a heparin  drip -Continue with albuterol 3 mL IH every 6 as needed for wheezing or shortness of breath -Appreciate cardiology assistance and recommendations -Her diuresis has now been held we will continue to monitor her volume status -Continue strict I's and O's and daily weights; patient's I's and O's are severely inaccurate given that it says if she is +53.624 L since admission -Weight is down from 213 pounds down to 206.57 -Continue to monitor for signs and symptoms of volume overload and repeat chest x-ray in the a.m.   Vitamin B 12 deficiency- (present on admission) -Continue with vitamin B12 1000 mcg p.o. daily  Hyperlipidemia associated with type 2 diabetes mellitus ([place])- (present on admission) -Continue with Rosuvastatin 40 mg p.o. daily as well as Fenofibrate 160 mg p.o. daily  Atrial fibrillation ([place])- (present on admission) -Has proximal atrial fibrillation with a CHA2DS2-VASc of greater than 5 and history of embolic strokes -He was getting anticoagulation with Coumadin which has not been held and switched to heparin drip in anticipation for cardiac cath -Her last INR was 2.8 -Cardiology increased her Coreg dosing-on 07/02/2021 -Continue to monitor on telemetry and appreciate further evaluation recommendation by cardiology   Subjective: Seen and examined at bedside and she states that she is not feeling well today and was unable to completely describe her symptoms but was nauseous this morning.  States that she also got lightheaded dizzy when she got up and ambulated.  Feels a little short of  breath.  No lightheadedness at rest.  Denies any real chest pain.  No other concerns or complaints at this time.  Physical Exam: Vitals:   07/03/21 0507 07/03/21 0834 07/03/21 1233 07/03/21 1524  BP: 112/65 (!) 132/103 115/67   Pulse: 78 100 81   Resp: 20 18 13    Temp: 98.3 F (36.8 C) 97.6 F (36.4 C) 98.6 F (37 C)   TempSrc: Oral Oral Oral   SpO2: 97% 98% 97% 97%  Weight:      Height:        Examination: Physical Exam:  Constitutional: WN/WD obese Caucasian female currently in no acute distress appears slightly uncomfortable  Eyes: Lids and conjunctivae normal, sclerae anicteric  ENMT: External Ears, Nose appear normal. Grossly normal hearing. Mucous membranes are moist.  Neck: Appears normal, supple, no cervical masses, normal ROM, no appreciable thyromegaly; no appreciable JVD Respiratory: Diminished to auscultation bilaterally with coarse breath sounds and some slight crackles but no appreciable wheezing, rales, rhonchi.. Normal respiratory effort and patient is not tachypenic. No accessory muscle use.  Unlabored breathing and not wearing supplemental oxygen via nasal cannula Cardiovascular: Slightly irregularly irregular and tachycardic, no murmurs / rubs / gallops. S1 and S2 auscultated.  1+ lower extremity edema Abdomen: Soft, non-tender, distended secondary body habitus.  Bowel sounds positive.  GU: Deferred. Musculoskeletal: No clubbing / cyanosis of digits/nails. No joint deformity upper and lower extremities.  Skin: No rashes, lesions, ulcers on limited skin evaluation. No induration; Warm and dry.  Neurologic: CN 2-12 grossly intact with no focal deficits. Romberg sign and cerebellar reflexes not assessed.  Psychiatric: Normal judgment and insight. Alert and oriented x 3. Normal mood and appropriate affect.   Data Reviewed: I have independently reviewed her BMP and CMP showed a BUN/creatinine of 32/1.30 and she has a hemoglobin/hematocrit of 10.7/30.2 She is anemic and has a slight hyperglycemia and CKD stage IIIa  Family Communication: Called Daughter Amy over the telephone but she did not pick up  Disposition: Status is: Inpatient Remains inpatient appropriate because: Patient will be undergoing left and right heart catheterization on Friday, 07/05/2021   Planned Discharge Destination:  Nursing Home with PT/OT  DVT Prophylaxis: Coumadin being switched to  Heparin gtt  Author: Raiford Noble, DO 07/03/2021 3:43 PM  For on call review [email or web address].

## 2021-07-03 NOTE — Assessment & Plan Note (Addendum)
-  Patient's baseline creatinine is between 1.2 and 1.4 -BUN/creatinine went from 40/1.52 -> 33/1.32 -> 32/1.30 -> 32/1.36 -> 34/1.45 today was 31/1.53 -> 31/1.52 -Continue monitor renal function as she is now had the addition of Cozaar as well as getting a Cardiac Cath -Cozaar has not been discontinued and cardiology initiating Entresto 24-26 mg p.o. twice daily and adding Farxiga 10 mg p.o. daily -Avoid nephrotoxic medications, contrast dyes, hypotension and renally dose medications if possible  -Repeat CMP within 1 wee

## 2021-07-03 NOTE — Progress Notes (Signed)
°  Mobility Specialist Criteria Algorithm Info.   07/03/21 1233  Therapy Vitals  Temp 98.6 F (37 C)  Temp Source Oral  Pulse Rate 81  Resp 13  BP 115/67  Patient Position (if appropriate) Sitting  Oxygen Therapy  SpO2 97 %  O2 Device Room Air  Mobility  Activity Refused mobility (Declined d/t feeling "lousy")     07/03/2021 4:42 PM  [name], [name], [name]  VQMGQ:[phone] Office: [phone]

## 2021-07-03 NOTE — Progress Notes (Addendum)
Progress Note  Patient Name: [name] Date of Encounter: 07/03/2021  [facility] Cardiologist: Sinclair Grooms, MD  EP: Dr. Lovena Le  Subjective   [name] has been diuresed but continues to have episodes of sudden shortness of breath.  These are consistent with flash pulmonary edema possibly due to unstable angina/anginal equivalent.  We were hoping to do an ischemic evaluation when she was stabilized-perhaps as an outpatient but I think she will need to have a heart catheterization this admission.  Heart rate is much better.  Heart rate is well controlled at 84. Inpatient Medications    Scheduled Meds:  carvedilol  12.5 mg Oral BID WC   cholecalciferol  1,000 Units Oral Daily   docusate sodium  100 mg Oral BID   fenofibrate  160 mg Oral Daily   lamoTRIgine  100 mg Oral Daily   lamoTRIgine  200 mg Oral QHS   levothyroxine  25 mcg Oral QAC breakfast   losartan  25 mg Oral Daily   pantoprazole  40 mg Oral Daily   rosuvastatin  40 mg Oral Daily   sodium chloride flush  3 mL Intravenous Q12H   cyanocobalamin  1,000 mcg Oral Daily   Warfarin - Pharmacist Dosing Inpatient   Does not apply q1600   Continuous Infusions:  PRN Meds: acetaminophen **OR** acetaminophen, albuterol, bisacodyl, hydrALAZINE, melatonin, ondansetron **OR** ondansetron (ZOFRAN) IV, prochlorperazine, SUMAtriptan   Vital Signs    Vitals:   07/03/21 0500 07/03/21 0507 07/03/21 0834 07/03/21 1233  BP:  112/65 (!) 132/103 115/67  Pulse:  78 100 81  Resp:  20 18 13   Temp:  98.3 F (36.8 C) 97.6 F (36.4 C) 98.6 F (37 C)  TempSrc:  Oral Oral Oral  SpO2:  97% 98% 97%  Weight: 93.7 kg     Height:        Intake/Output Summary (Last 24 hours) at 07/03/2021 1344 Last data filed at 07/03/2021 0530 Gross per 24 hour  Intake 360 ml  Output 900 ml  Net -540 ml    Last 3 Weights 07/03/2021 07/02/2021 06/30/2021  Weight (lbs) 206 lb 9.1 oz 205 lb 6.4 oz 203 lb 12.8 oz  Weight (kg) 93.7 kg 93.169 kg  92.443 kg      Telemetry    Atrial fibrillation 70s, this morning it went up to 110s - Personally Reviewed  ECG    N/a  Physical Exam   Physical Exam: Blood pressure 115/67, pulse 81, temperature 98.6 F (37 C), temperature source Oral, resp. rate 13, height 5\' 8"  (1.727 m), weight 93.7 kg, SpO2 97 %.  GEN:  elderly female,  ? slight dementia  HEENT: Normal NECK: No JVD; No carotid bruits LYMPHATICS: No lymphadenopathy CARDIAC: RRR  mechanical S2  RESPIRATORY:  Clear to auscultation without rales, wheezing or rhonchi  ABDOMEN: Soft, non-tender, non-distended MUSCULOSKELETAL:  No edema; No deformity  SKIN: Warm and dry NEUROLOGIC:  Alert and oriented x 3  Labs    High Sensitivity Troponin:   Recent Labs  Lab 06/27/21 1443  TROPONINIHS 54*      Chemistry Recent Labs  Lab 06/29/21 0242 06/30/21 0248 07/01/21 0645 07/02/21 0558 07/03/21 0501  NA 141 140 139 140 140  K 4.3 3.9 4.2 3.9 4.3  CL 100 103 103 105 105  CO2 29 27 26 26 26   GLUCOSE 143* 146* 136* 123* 103*  BUN 31* 40* 32* 33* 32*  CREATININE 1.40* 1.52* 1.37* 1.32* 1.30*  CALCIUM 10.3  10.3 9.7 9.6 9.5  MG 2.1 2.0 1.8 1.7 1.8  PROT 7.3 6.8 7.1  --   --   ALBUMIN 4.0 3.7 4.0  --   --   AST 18 18 20   --   --   ALT 11 10 13   --   --   ALKPHOS 33* 32* 30*  --   --   BILITOT 0.8 0.6 0.6  --   --   GFRNONAA 40* 37* 42* 43* 44*  ANIONGAP 12 10 10 9 9      Hematology Recent Labs  Lab 06/30/21 0248 07/01/21 0645 07/03/21 1042  WBC 6.5 7.7 6.9  RBC 3.85* 3.92 3.64*  HGB 11.5* 11.7* 10.7*  HCT 36.0 36.4 34.2*  MCV 93.5 92.9 94.0  MCH 29.9 29.8 29.4  MCHC 31.9 32.1 31.3  RDW 14.9 14.9 14.6  PLT 309 330 300    Thyroid  Recent Labs  Lab 06/27/21 1835  TSH 2.578     BNP Recent Labs  Lab 06/29/21 0242 06/30/21 0248 07/01/21 0645  BNP 432.2* 263.0* 273.4*     DDimer No results for input(s): DDIMER in the last 168 hours.   Radiology    No results found.  Cardiac Studies   Echo   1. EF has decreased since echo done 2021 . Left ventricular ejection  fraction, by estimation, is 25 to 30%. The left ventricle has severely  decreased function. The left ventricle demonstrates global hypokinesis.  The left ventricular internal cavity size   was moderately dilated. Left ventricular diastolic parameters are  indeterminate.   2. Pacing wires in RV/RA. Right ventricular systolic function is normal.  The right ventricular size is normal. There is mildly elevated pulmonary  artery systolic pressure.   3. Left atrial size was moderately dilated.   4. The mitral valve is degenerative. Mild mitral valve regurgitation. No  evidence of mitral stenosis. Moderate mitral annular calcification.   5. Tricuspid valve regurgitation is mild to moderate.   6. Post AVR with 25 mm St Jude trivial closing volume central AR low  gradients normal function . The aortic valve has been repaired/replaced.  Aortic valve regurgitation is trivial. No aortic stenosis is present.  There is a 25 mm St. Jude mechanical  valve present in the aortic position.   7. The inferior vena cava is normal in size with greater than 50%  respiratory variability, suggesting right atrial pressure of 3 mmHg.   Patient Profile     71 y.o. female with a hx of bicuspid AV/AS with ascending aortic aneurysm s/p resection/grafting of ascending aortic aneurysm and placement of St. Jude mechanical AV conduit/Bentall 2009, atrial fibrillation/flutter, bradycardia s/p Bos Sci PPM implantation 12/2019, multiple falls with prior craniotomy for subdural hematoma/subarachnoid hemorrhage, prior chronic infarcts on MRI 2021, bipolar disorder, chronic diastolic CHF (prior LVEF 16-10% in 2021, improved to 50-55% in 03/2020), anemia, depression, debilitation (wheelchair bound), DM, HLD, HTN, mild carotid artery disease 2021, back pain, obesity who is being seen  for the evaluation of CHF at the request of Benedetto Goad PA-C.  Assessment & Plan     Acute combined CHF - 3 weeks hx of progressive worsening dyspnea with edema. Elevated JVD on arrival. Initially required BiPAP in the ED. BNP 854 >> 743.  She has improved only slightly with diuresis and controlling her HR . Her markedly reduced LVEF could be due to CAD INR is 2.8.  I have scheduled her for R and L heart cath on Friday  Stopping coumadin, starting heparin  Vit K 2.5 mg PO today   Will discuss with patient and her daughter ( Amy Ventry-Smith , RN on 4N)   2. Atrial fibrillation  HR is better   3.  S/p AVR -Reassuring echocardiogram Holding warfarin for cath on Friday Start heparin when INR is < 2.5     4.  Chronic kidney disease stage III  .   For questions or updates, please contact [place] Please consult [email or web address] for contact info under

## 2021-07-03 NOTE — Assessment & Plan Note (Addendum)
-  Had a repeat echocardiogram done which showed a post AVR with a 25 mm Saint Jude trivial closing volume central AR low-grade and normal function -Her aortic valve regurgitation is trivial and her aortic stenosis is present -Cardiology evaluated and felt that this was reassuring for her -Appreciate Card's recc's; Resuming Coumadin and will need to make sure it's therapeutic given AVR and so she is now back on a heparin drip while reloading her Coumadin -Cardiology feels that she can be discharged on Lovenox injections and addition of Coumadin if she gets discharged -Patient is status post resection and grafting of ascending aortic aneurysm and placement of a 23 mm Saint Jude mechanical AVR conduit/Bentall in 2009 -Appreciate cardiology's recommendations and continue monitor INR daily as it was 1.4 today -**Discharged the patient on full dose Lovenox 100 mg subcu every 12 in addition to Coumadin and have the patient repeat her INR in 2 to 3 days

## 2021-07-03 NOTE — Assessment & Plan Note (Addendum)
-  Continue with pantoprazole 40 mg p.o. daily

## 2021-07-03 NOTE — Assessment & Plan Note (Addendum)
-  Continue with Carvedilol 12.5 milligrams p.o. twice daily  -Cardiology stopping her losartan and changing to p.o. Entresto 24-26 mg p.o. twice daily -Continue with hydralazine 10 mg IV every 6 as needed for systolic blood pressure greater than 150 -Continue to monitor blood pressures per protocol -Last blood pressure reading was 114/67

## 2021-07-03 NOTE — Assessment & Plan Note (Addendum)
-  Patient's Hb/Hct went from 11.7/36.4 -> 10.7/34.2 -> 10.0/32.0 -> 9.9/32.0 -> 10.9/32.0 -> 10.0/31.7 -> 10.5/33.5  -Check anemia panel as an outpatient -Continue to monitor for signs and symptoms of bleeding as she is anticoagulated and being Heparin drip and Coumadin; no overt bleeding noted -Repeat CBC in the a.m.

## 2021-07-03 NOTE — Assessment & Plan Note (Signed)
-  C/w Levothyroxine 25 mcg po daily  ?

## 2021-07-03 NOTE — Progress Notes (Addendum)
ANTICOAGULATION CONSULT NOTE  Pharmacy Consult for warfarin Indication: atrial fibrillation  Allergies  Allergen Reactions   Mucinex [Guaifenesin Er] Other (See Comments)    Severe headaches    Keflex [Cephalexin] Other (See Comments)    Headaches and dizziness   Keflex [Cephalexin] Other (See Comments)    Headaches and/or dizziness- "allergic," per [facility]   Mucinex [Guaifenesin Er] Other (See Comments)    Severe headaches- "allergic," per [facility]   Sulfa Antibiotics Other (See Comments)    headaches   Sulfa Antibiotics Other (See Comments)    Headaches- "allergic," per [facility]    Sulfonamide Derivatives Other (See Comments)    Headaches     Patient Measurements: Height: 5\' 8"  (172.7 cm) Weight: 93.7 kg (206 lb 9.1 oz) IBW/kg (Calculated) : 63.9 Heparin Dosing Weight: 84.9 kg   Vital Signs: Temp: 98.3 F (36.8 C) (02/08 0507) Temp Source: Oral (02/08 0507) BP: 112/65 (02/08 0507) Pulse Rate: 78 (02/08 0507)  Labs: Recent Labs    07/01/21 0645 07/02/21 0310 07/02/21 0558 07/03/21 0501  HGB 11.7*  --   --   --   HCT 36.4  --   --   --   PLT 330  --   --   --   LABPROT 30.1* 30.9*  --  29.7*  INR 2.9* 3.0*  --  2.8*  CREATININE 1.37*  --  1.32* 1.30*     Estimated Creatinine Clearance: 48.2 mL/min (A) (by C-G formula based on SCr of 1.3 mg/dL (H)).   Medical History: Past Medical History:  Diagnosis Date   ANEMIA 05/28/2010   AORTIC VALVE REPLACEMENT, HX OF 03/11/2010   Mechanical prosthesis   Ascending aortic aneurysm    Atrial fibrillation (HCC)    Atrial flutter ([place])    BIPOLAR AFFECTIVE DISORDER 03/11/2010   Bradycardia    Chronic diastolic CHF (congestive heart failure) (HCC)    Depression    Diabetes ([place])    Diverticulitis 12/18/2010   Falls    FIBROIDS, UTERUS 03/11/2010   Gout 09/04/2016   Grave's disease 12/2010   Hyperlipidemia associated with type 2 diabetes mellitus ([place]) 06/05/2016   Hypertension    Low back pain 11/02/2017   Migraine  06/05/2016   Mixed hyperlipidemia 03/11/2010   Obesity    Pacemaker    Prolonged QT interval    SAH (subarachnoid hemorrhage) ([place])    Skin cancer    Subdural hematoma    Syncope 08/22/2019   UTI (lower urinary tract infection) 08/14/2011    Assessment: 22 YOF with h/o recurrent AF and h/o mechanical AV valve replacement on warfarin at facility. Pharmacy consulted to resume warfarin. Per facility records, patient's last dose of warfarin was on 1/31.  Warfarin regimen PTA: 3.5 mg daily (recently reduced from 4mg  daily), total weekly dose 24.5mg    INR remains therapeutic at 2.8, CBC stable.  Goal of Therapy:  INR 2.5-3.5  Monitor platelets by anticoagulation protocol: Yes   Plan:  -Warfarin 3.5 mg x 1 dose tonight  -Daily INR  Arrie Senate, PharmD, BCPS, [facility] Clinical Pharmacist [phone] Please check AMION for all [place] numbers 07/03/2021   ADDENDUM 1350: Pt now to undergo cardiac cath, will hold warfarin and begin IV heparin once INR <2.5.   Arrie Senate, PharmD, BCPS, [facility] Clinical Pharmacist Please check AMION for all [facility] Pharm[facility] numbers 07/03/2021

## 2021-07-03 NOTE — Assessment & Plan Note (Addendum)
-  Repeat echocardiogram done here showed an LVEF of 25 to 30% with severely decreased function in the left ventricle demonstrates global hypokinesis and left ventricular internal cavity size is moderately dilated with the ventricular diastolic parameters being indeterminate -She had 3 weeks of progressively worsening dyspnea with edema -Had elevated JVP on arrival and initially required BiPAP -BNP was elevated at 854.4 on admission and trended down to 273.4 -Currently getting diuresis and has improving slightly -INR is 2.8 and cardiology is planning for right and left heart catheterization Friday and recommending stopping Coumadin, given vitamin K 2.5 mg p.o. today and starting a heparin drip -Continue with albuterol 3 mL IH every 6 as needed for wheezing or shortness of breath -Appreciate cardiology assistance and recommendations -Her diuresis had been held but after cath cardiology recommends continuing diuresis and medical therapy for CHF; Continue Diuresis per Cardiology  -Continue strict I's and O's and daily weights; patient's I's and O's were inaccurate yesterday but have been corrected and now she is -2.961 L since admission -Weight is down from 213 pounds down to 206.57 yesterday and today is 207 -Continue to monitor for signs and symptoms of volume overload and repeat chest x-ray yesterday a.m. showed "No evidence of acute chest disease. Cardiomegaly with aortic valve prosthesis and pacemaker."

## 2021-07-03 NOTE — Progress Notes (Signed)
Occupational Therapy Treatment Patient Details Name: [name] MRN: [id number] DOB: 10-18-1950 Today's Date: 07/03/2021   History of present illness Patient is a 71 yo female who presented to ED on 06/27/21 with shortness of breath and Afib in the 130s and placed on Bipap. Admitted with CHF exacerbation.Patient resides at [facility] and had missed doses of Lasix at [facility].  PMH of DM, migraines, recurrent UTIs, anemia, HTN, atrial fibrillation and mechanical aortic valve replacement on Coumadin, AKI, and frequent falls.   OT comments  Patient continues to make progress towards goals in skilled OT session. Patient's session encompassed seated BLE exercises. Patient continues to report feeling "lousy". Of note, RN informing therapist that patients HR elevates to 120 range with all movement, causing patient to feel lightheaded. Patient wanting to complete exercises in chair, with good effort and demonstration shown. Daughter (RN at [facility]) and MD present at end of session to discuss plan for heart cath on Friday 2/10. Therapy will continue to follow.    Recommendations for follow up therapy are one component of a multi-disciplinary discharge planning process, led by the attending physician.  Recommendations may be updated based on patient status, additional functional criteria and insurance authorization.    Follow Up Recommendations  Home health OT (Patient resides at SNF)    Assistance Recommended at Discharge Intermittent Supervision/Assistance  Patient can return home with the following  Other (comment) (Patient resident at [facility])   Equipment Recommendations  None recommended by OT (Patient has all DME needed)    Recommendations for Other Services      Precautions / Restrictions Precautions Precautions: Fall Precaution Comments: Watch HR Restrictions Weight Bearing Restrictions: No       Mobility Bed Mobility               General bed mobility comments: Up in chair upon  arrival    Transfers                   General transfer comment: Declined     Balance                                           ADL either performed or assessed with clinical judgement   ADL                                         General ADL Comments: Session focus on exercises from chair due to increased fatigue and elevated HR when completing activities in standing    Extremity/Trunk Assessment              Vision       Perception     Praxis      Cognition Arousal/Alertness: Awake/alert Behavior During Therapy: WFL for tasks assessed/performed Overall Cognitive Status: History of cognitive impairments - at baseline                                          Exercises General Exercises - Lower Extremity Ankle Circles/Pumps: AROM, Both, 20 reps Hip ABduction/ADduction: AROM, Both, 20 reps Hip Flexion/Marching: AROM, Both, 20 reps Toe Raises: AROM, Both, 20 reps Heel Raises: AROM, Both, 20 reps  Shoulder Instructions       General Comments      Pertinent Vitals/ Pain       Pain Assessment Pain Assessment: No/denies pain  Home Living                                          Prior Functioning/Environment              Frequency  Min 2X/week        Progress Toward Goals  OT Goals(current goals can now be found in the care plan section)  Progress towards OT goals: Progressing toward goals  Acute Rehab OT Goals Patient Stated Goal: to get this heart cath over with OT Goal Formulation: With patient Time For Goal Achievement: 07/12/21 Potential to Achieve Goals: [place] Discharge plan remains appropriate    Co-evaluation                 AM-PAC OT "6 Clicks" Daily Activity     Outcome Measure   Help from another person eating meals?: None Help from another person taking care of personal grooming?: A Little Help from another person toileting,  which includes using toliet, bedpan, or urinal?: A Little Help from another person bathing (including washing, rinsing, drying)?: A Little Help from another person to put on and taking off regular upper body clothing?: A Little Help from another person to put on and taking off regular lower body clothing?: A Little 6 Click Score: 19    End of Session    OT Visit Diagnosis: Unsteadiness on feet (R26.81);Other abnormalities of gait and mobility (R26.89);Muscle weakness (generalized) (M62.81);Other symptoms and signs involving cognitive function   Activity Tolerance Patient tolerated treatment well   Patient Left in chair;with call bell/phone within reach;with family/visitor present;with nursing/sitter in room   Nurse Communication Mobility status        Time: 8675-4492 OT Time Calculation (min): 13 min  Charges: OT General Charges $OT Visit: 1 Visit OT Treatments $Therapeutic Exercise: 8-22 mins  [place]. Tauri Ethington, OTR/L Acute Rehabilitation Services [phone] [place] 07/03/2021, 3:28 PM

## 2021-07-03 NOTE — Assessment & Plan Note (Signed)
-  Continue with vitamin B12 1000 mcg p.o. daily

## 2021-07-03 NOTE — Assessment & Plan Note (Addendum)
-  Has proximal atrial fibrillation with a CHA2DS2-VASc of greater than 5 and history of embolic strokes -She was getting anticoagulation with Coumadin which had been held and switched to heparin drip in anticipation for cardiac cath; not have a cath is done we will resume her anticoagulation -Her last INR went from 2.8 -> 2.0 -> 1.3 -> 1.2 -> 1.4 -Cardiology increased her Coreg dosing-on 07/02/2021 -Continue to monitor on telemetry and appreciate further evaluation recommendation by cardiology

## 2021-07-04 DIAGNOSIS — I480 Paroxysmal atrial fibrillation: Secondary | ICD-10-CM

## 2021-07-04 DIAGNOSIS — J9601 Acute respiratory failure with hypoxia: Secondary | ICD-10-CM | POA: Diagnosis not present

## 2021-07-04 DIAGNOSIS — I4891 Unspecified atrial fibrillation: Secondary | ICD-10-CM | POA: Diagnosis not present

## 2021-07-04 DIAGNOSIS — I5043 Acute on chronic combined systolic (congestive) and diastolic (congestive) heart failure: Secondary | ICD-10-CM | POA: Diagnosis not present

## 2021-07-04 DIAGNOSIS — I10 Essential (primary) hypertension: Secondary | ICD-10-CM | POA: Diagnosis not present

## 2021-07-04 LAB — CBC WITH DIFFERENTIAL/PLATELET
Abs Immature Granulocytes: 0.02 10*3/uL (ref 0.00–0.07)
Basophils Absolute: 0.1 10*3/uL (ref 0.0–0.1)
Basophils Relative: 2 %
Eosinophils Absolute: 0.2 10*3/uL (ref 0.0–0.5)
Eosinophils Relative: 3 %
HCT: 32 % — ABNORMAL LOW (ref 36.0–46.0)
Hemoglobin: 10 g/dL — ABNORMAL LOW (ref 12.0–15.0)
Immature Granulocytes: 0 %
Lymphocytes Relative: 24 %
Lymphs Abs: 1.4 10*3/uL (ref 0.7–4.0)
MCH: 29.9 pg (ref 26.0–34.0)
MCHC: 31.3 g/dL (ref 30.0–36.0)
MCV: 95.5 fL (ref 80.0–100.0)
Monocytes Absolute: 0.6 10*3/uL (ref 0.1–1.0)
Monocytes Relative: 10 %
Neutro Abs: 3.5 10*3/uL (ref 1.7–7.7)
Neutrophils Relative %: 61 %
Platelets: 250 10*3/uL (ref 150–400)
RBC: 3.35 MIL/uL — ABNORMAL LOW (ref 3.87–5.11)
RDW: 14.8 % (ref 11.5–15.5)
WBC: 5.8 10*3/uL (ref 4.0–10.5)
nRBC: 0 % (ref 0.0–0.2)

## 2021-07-04 LAB — COMPREHENSIVE METABOLIC PANEL
ALT: 11 U/L (ref 0–44)
AST: 16 U/L (ref 15–41)
Albumin: 3.5 g/dL (ref 3.5–5.0)
Alkaline Phosphatase: 25 U/L — ABNORMAL LOW (ref 38–126)
Anion gap: 8 (ref 5–15)
BUN: 32 mg/dL — ABNORMAL HIGH (ref 8–23)
CO2: 29 mmol/L (ref 22–32)
Calcium: 9.5 mg/dL (ref 8.9–10.3)
Chloride: 104 mmol/L (ref 98–111)
Creatinine, Ser: 1.36 mg/dL — ABNORMAL HIGH (ref 0.44–1.00)
GFR, Estimated: 42 mL/min — ABNORMAL LOW (ref >60)
Glucose, Bld: 136 mg/dL — ABNORMAL HIGH (ref 70–99)
Potassium: 4.4 mmol/L (ref 3.5–5.1)
Sodium: 141 mmol/L (ref 135–145)
Total Bilirubin: 0.5 mg/dL (ref 0.3–1.2)
Total Protein: 6.3 g/dL — ABNORMAL LOW (ref 6.5–8.1)

## 2021-07-04 LAB — MAGNESIUM: Magnesium: 2 mg/dL (ref 1.7–2.4)

## 2021-07-04 LAB — PHOSPHORUS: Phosphorus: 4.1 mg/dL (ref 2.5–4.6)

## 2021-07-04 LAB — HEPARIN LEVEL (UNFRACTIONATED): Heparin Unfractionated: 0.31 IU/mL (ref 0.30–0.70)

## 2021-07-04 LAB — PROTIME-INR
INR: 2 — ABNORMAL HIGH (ref 0.8–1.2)
Prothrombin Time: 22.5 seconds — ABNORMAL HIGH (ref 11.4–15.2)

## 2021-07-04 MED ORDER — HEPARIN (PORCINE) 25000 UT/250ML-% IV SOLN
1250.0000 [IU]/h | INTRAVENOUS | Status: DC
  Administered 2021-07-04: 10:00:00 1200 [IU]/h via INTRAVENOUS
  Administered 2021-07-05: 1250 [IU]/h via INTRAVENOUS
  Filled 2021-07-04 (×2): qty 250

## 2021-07-04 NOTE — Progress Notes (Signed)
ANTICOAGULATION CONSULT NOTE  Pharmacy Consult for heparin  Indication: atrial fibrillation  Allergies  Allergen Reactions   Mucinex [Guaifenesin Er] Other (See Comments)    Severe headaches    Keflex [Cephalexin] Other (See Comments)    Headaches and dizziness   Keflex [Cephalexin] Other (See Comments)    Headaches and/or dizziness- "allergic," per [facility]   Mucinex [Guaifenesin Er] Other (See Comments)    Severe headaches- "allergic," per [facility]   Sulfa Antibiotics Other (See Comments)    headaches   Sulfa Antibiotics Other (See Comments)    Headaches- "allergic," per [facility]    Sulfonamide Derivatives Other (See Comments)    Headaches     Patient Measurements: Height: 5\' 8"  (172.7 cm) Weight: 94.8 kg (208 lb 15.9 oz) IBW/kg (Calculated) : 63.9 Heparin Dosing Weight: 84.9 kg   Vital Signs: Temp: 97.9 F (36.6 C) (02/09 0600) Temp Source: Oral (02/09 0600) BP: 125/75 (02/09 0600) Pulse Rate: 116 (02/09 0740)  Labs: Recent Labs    07/02/21 0310 07/02/21 0558 07/03/21 0501 07/03/21 1042 07/04/21 0417  HGB  --   --   --  10.7* 10.0*  HCT  --   --   --  34.2* 32.0*  PLT  --   --   --  300 250  LABPROT 30.9*  --  29.7*  --  22.5*  INR 3.0*  --  2.8*  --  2.0*  CREATININE  --  1.32* 1.30*  --  1.36*     Estimated Creatinine Clearance: 46.4 mL/min (A) (by C-G formula based on SCr of 1.36 mg/dL (H)).   Medical History: Past Medical History:  Diagnosis Date   ANEMIA 05/28/2010   AORTIC VALVE REPLACEMENT, HX OF 03/11/2010   Mechanical prosthesis   Ascending aortic aneurysm    Atrial fibrillation (HCC)    Atrial flutter ([place])    BIPOLAR AFFECTIVE DISORDER 03/11/2010   Bradycardia    Chronic diastolic CHF (congestive heart failure) (HCC)    Depression    Diabetes ([place])    Diverticulitis 12/18/2010   Falls    FIBROIDS, UTERUS 03/11/2010   Gout 09/04/2016   Grave's disease 12/2010   Hyperlipidemia associated with type 2 diabetes mellitus ([place]) 06/05/2016    Hypertension    Low back pain 11/02/2017   Migraine 06/05/2016   Mixed hyperlipidemia 03/11/2010   Obesity    Pacemaker    Prolonged QT interval    SAH (subarachnoid hemorrhage) ([place])    Skin cancer    Subdural hematoma    Syncope 08/22/2019   UTI (lower urinary tract infection) 08/14/2011    Assessment: 24 YOF with h/o recurrent AF and h/o mechanical AV valve replacement on warfarin at facility. Pharmacy consulted to dose heparin (to start when INR < 2.5). Plans are noted for R/L hearth cath on 2/10 -INR= 2.0  Warfarin regimen PTA: 3.5 mg daily (recently reduced from 4mg  daily), total weekly dose 24.5mg      Goal of Therapy:  Heparin level= 0.3-0.7 Monitor platelets by anticoagulation protocol: Yes   Plan:  -Start heparin at 1200 units/hr -Heparin level in 8 hours and daily wth CBC daily  Hildred Laser, PharmD Clinical Pharmacist **Pharmacist phone directory can now be found on [email or web address] ([email or web address]).  Listed under [place].

## 2021-07-04 NOTE — Care Management Important Message (Signed)
Important Message  Patient Details  Name: [name] MRN: [id number] Date of Birth: [birth year]   Medicare Important Message Given:  Yes     [name] 07/04/2021, 9:38 AM

## 2021-07-04 NOTE — Progress Notes (Signed)
Progress Note  Patient Name: [name] Date of Encounter: 07/04/2021  [facility] Cardiologist: Sinclair Grooms, MD  EP: Dr. Lovena Le  Subjective   [name] has been diuresed but continues to have episodes of sudden shortness of breath.  These are consistent with flash pulmonary edema possibly due to unstable angina/anginal equivalent.  We were hoping to do an ischemic evaluation when she was stabilized-perhaps as an outpatient but I think she will need to have a heart catheterization this admission.   Her INR has come down to 2.0. She will be ready for cath tomorrow Heparin to start soon    Inpatient Medications    Scheduled Meds:  aspirin  81 mg Oral Pre-Cath   carvedilol  12.5 mg Oral BID WC   cholecalciferol  1,000 Units Oral Daily   docusate sodium  100 mg Oral BID   fenofibrate  160 mg Oral Daily   lamoTRIgine  100 mg Oral Daily   lamoTRIgine  200 mg Oral QHS   levothyroxine  25 mcg Oral QAC breakfast   losartan  25 mg Oral Daily   pantoprazole  40 mg Oral Daily   rosuvastatin  40 mg Oral Daily   sodium chloride flush  3 mL Intravenous Q12H   sodium chloride flush  3 mL Intravenous Q12H   cyanocobalamin  1,000 mcg Oral Daily   Continuous Infusions:  sodium chloride     sodium chloride 10 mL/hr at 07/03/21 1641   heparin     PRN Meds: sodium chloride, acetaminophen **OR** acetaminophen, albuterol, bisacodyl, hydrALAZINE, melatonin, ondansetron **OR** ondansetron (ZOFRAN) IV, prochlorperazine, sodium chloride flush, SUMAtriptan   Vital Signs    Vitals:   07/03/21 1631 07/03/21 2146 07/04/21 0600 07/04/21 0740  BP: 121/73 (!) 104/45 125/75   Pulse: 77 70 97 (!) 116  Resp:  17 20 (!) 23  Temp:  98 F (36.7 C) 97.9 F (36.6 C)   TempSrc:  Oral Oral   SpO2:  97% 98% (!) 82%  Weight:   94.8 kg   Height:        Intake/Output Summary (Last 24 hours) at 07/04/2021 0852 Last data filed at 07/03/2021 1700 Gross per 24 hour  Intake 50 ml  Output 300  ml  Net -250 ml    Last 3 Weights 07/04/2021 07/03/2021 07/02/2021  Weight (lbs) 208 lb 15.9 oz 206 lb 9.1 oz 205 lb 6.4 oz  Weight (kg) 94.8 kg 93.7 kg 93.169 kg      Telemetry       ECG    N/a  Physical Exam   Physical Exam: Blood pressure 125/75, pulse (!) 116, temperature 97.9 F (36.6 C), temperature source Oral, resp. rate (!) 23, height 5\' 8"  (1.727 m), weight 94.8 kg, SpO2 (!) 82 %.  GEN:  Well nourished, well developed in no acute distress HEENT: Normal NECK: No JVD; No carotid bruits LYMPHATICS: No lymphadenopathy CARDIAC: Irreg. Irreg. , no murmurs, rubs, gallops RESPIRATORY:  Clear to auscultation without rales, wheezing or rhonchi  ABDOMEN: Soft, non-tender, non-distended MUSCULOSKELETAL:  No edema; No deformity  SKIN: Warm and dry NEUROLOGIC:  Alert and oriented x 3   Labs    High Sensitivity Troponin:   Recent Labs  Lab 06/27/21 1443  TROPONINIHS 54*      Chemistry Recent Labs  Lab 06/30/21 0248 07/01/21 0645 07/02/21 0558 07/03/21 0501 07/04/21 0417  NA 140 139 140 140 141  K 3.9 4.2 3.9 4.3 4.4  CL 103  103 105 105 104  CO2 27 26 26 26 29   GLUCOSE 146* 136* 123* 103* 136*  BUN 40* 32* 33* 32* 32*  CREATININE 1.52* 1.37* 1.32* 1.30* 1.36*  CALCIUM 10.3 9.7 9.6 9.5 9.5  MG 2.0 1.8 1.7 1.8 2.0  PROT 6.8 7.1  --   --  6.3*  ALBUMIN 3.7 4.0  --   --  3.5  AST 18 20  --   --  16  ALT 10 13  --   --  11  ALKPHOS 32* 30*  --   --  25*  BILITOT 0.6 0.6  --   --  0.5  GFRNONAA 37* 42* 43* 44* 42*  ANIONGAP 10 10 9 9 8      Hematology Recent Labs  Lab 07/01/21 0645 07/03/21 1042 07/04/21 0417  WBC 7.7 6.9 5.8  RBC 3.92 3.64* 3.35*  HGB 11.7* 10.7* 10.0*  HCT 36.4 34.2* 32.0*  MCV 92.9 94.0 95.5  MCH 29.8 29.4 29.9  MCHC 32.1 31.3 31.3  RDW 14.9 14.6 14.8  PLT 330 300 250    Thyroid  Recent Labs  Lab 06/27/21 1835  TSH 2.578     BNP Recent Labs  Lab 06/29/21 0242 06/30/21 0248 07/01/21 0645  BNP 432.2* 263.0* 273.4*      DDimer No results for input(s): DDIMER in the last 168 hours.   Radiology    No results found.  Cardiac Studies   Echo  1. EF has decreased since echo done 2021 . Left ventricular ejection  fraction, by estimation, is 25 to 30%. The left ventricle has severely  decreased function. The left ventricle demonstrates global hypokinesis.  The left ventricular internal cavity size   was moderately dilated. Left ventricular diastolic parameters are  indeterminate.   2. Pacing wires in RV/RA. Right ventricular systolic function is normal.  The right ventricular size is normal. There is mildly elevated pulmonary  artery systolic pressure.   3. Left atrial size was moderately dilated.   4. The mitral valve is degenerative. Mild mitral valve regurgitation. No  evidence of mitral stenosis. Moderate mitral annular calcification.   5. Tricuspid valve regurgitation is mild to moderate.   6. Post AVR with 25 mm St Jude trivial closing volume central AR low  gradients normal function . The aortic valve has been repaired/replaced.  Aortic valve regurgitation is trivial. No aortic stenosis is present.  There is a 25 mm St. Jude mechanical  valve present in the aortic position.   7. The inferior vena cava is normal in size with greater than 50%  respiratory variability, suggesting right atrial pressure of 3 mmHg.   Patient Profile     71 y.o. female with a hx of bicuspid AV/AS with ascending aortic aneurysm s/p resection/grafting of ascending aortic aneurysm and placement of St. Jude mechanical AV conduit/[place] 2009, atrial fibrillation/flutter, bradycardia s/p Bos Sci PPM implantation 12/2019, multiple falls with prior craniotomy for subdural hematoma/subarachnoid hemorrhage, prior chronic infarcts on MRI 2021, bipolar disorder, chronic diastolic CHF (prior LVEF 86-57% in 2021, improved to 50-55% in 03/2020), anemia, depression, debilitation (wheelchair bound), DM, HLD, HTN, mild carotid artery  disease 2021, back pain, obesity who is being seen  for the evaluation of CHF at the request of Benedetto Goad PA-C.  Assessment & Plan    Acute combined CHF - 3 weeks hx of progressive worsening dyspnea with edema. Elevated JVD on arrival. Initially required BiPAP in the ED. BNP 854 >> 743.  She  has not had severe episodes of dyspnea.    Plan is for cath tomorrow     2. Atrial fibrillation  HR is better ,  cont meds  3.  S/p AVR -Reassuring echocardiogram Holding warfarin for cath on Friday Start heparin     4.  Chronic kidney disease stage III  .   For questions or updates, please contact [place] Please consult [email or web address] for contact info under      Mertie Moores, MD  07/04/2021 9:33 AM    [address],  [place], [place]  [address] Phone: [phone]; Fax: [phone]

## 2021-07-04 NOTE — Progress Notes (Signed)
Heart Failure Nurse Navigator Progress Note  Adjusted HV TOC clinic schedule for 2/20 @ 10AM. Updated AVS.   Planned cardiac cath this hospitalization.  Pricilla Holm, MSN, RN Heart Failure Nurse Navigator [phone]

## 2021-07-04 NOTE — TOC Progression Note (Signed)
Transition of Care [facility]) - Progression Note    Patient Details  Name: [name] MRN: [id number] Date of Birth: 07/12/1950  Transition of Care [facility]) CM/SW [place], [place] Phone Number: 07/04/2021, 3:02 PM  Clinical Narrative:     Patient has SNF bed at [facility] when medically ready.CSW will continue to follow and assist with patients dc planning needs.  Expected Discharge Plan: [place] Barriers to Discharge: Continued Medical Work up  Expected Discharge Plan and Services Expected Discharge Plan: [place] arrangements for the past 2 months: [place]                                       Social Determinants of Health (SDOH) Interventions    Readmission Risk Interventions Readmission Risk Prevention Plan 05/03/2020  Transportation Screening Complete  Home Care Screening Complete  Medication Review (RN CM) Complete  Some recent data might be hidden

## 2021-07-04 NOTE — Progress Notes (Signed)
Progress Note   Patient: [name] DUK:[id number] DOB: January 19, 1951 DOA: 06/27/2021     7 DOS: the patient was seen and examined on 07/04/2021   Brief hospital course: The patient is a 71 year old obese Caucasian female with a past medical history significant for breast admitted to [facility] aortic valve that was placed in 2011, PAF with CHA2DS2-VASc score of greater than 5 and history of embolic strokes and history of multiple falls with subdural bleeds requiring craniotomy, diabetes mellitus type 2, bipolar disorder, history of migraines, history of hypertension, hyperlipidemia, syncope, history of tachybradycardia syndrome status post pacemaker placement, as well as other comorbidities who lives in a nursing home and presented with shortness of breath and was admitted with a diagnosis of acute respiratory failure with hypoxia in the setting of acute on chronic systolic and diastolic CHF.  Cardiology was consulted and she was initiated on IV diuresis and responded well.  Patient continues to have some dyspnea and cardiology feels that since she is only improved slightly with diuresis and controlling heart rate there planning for right and left heart catheterization and this will be done on Friday.  They have initiated a heparin drip.  Her Coumadin level given her vitamin K 2.5 mg p.o. x1.  PT OT recommending SNF.  Will need to monitor her symptoms as she was dizzy today and nauseous.  Assessment and Plan: * Acute hypoxemic respiratory failure ([place])- (present on admission) - She had acute hypoxic respiratory failure in setting of acute on chronic systolic and diastolic CHF with reduced EF from a few years ago -She missed some doses of Lasix at SNF due to some miscommunication but she denies any chest pain and did not have any acute EKG changes -She is given IV Lasix with approximately 3-1/2-4 point liters of diuresis and her shortness of breath is much improved. -She had to be placed on BiPAP initially  but is now weaned to room air -She was on Lasix per cardiology recommendations but this has been discontinued given that she is not volume overloaded on exam -SpO2: 97 % O2 Flow Rate (L/min): 3 L/min FiO2 (%): 40 %; no longer wearing supplemental oxygen via nasal cannula and has been off oxygen for 2 days -Dr. Candiss Norse discussed with the cardiologist yesterday and they increased her Coreg and ARB -She will need to follow-up with Dr. Tamala Julian her primary cardiologist at discharge but cardiology is planning a cardiac catheterization this hospitalization -Continuous pulse oximetry maintain O2 saturation greater than 90% -Patient will need an ambulatory home O2 screen prior to discharge and a repeat chest x-ray  Obesity (BMI 37-62.8) -Complicates overall prognosis and care -Estimated body mass index is 31.41 kg/m as calculated from the following:   Height as of this encounter: 5\' 8"  (1.727 m).   Weight as of this encounter: 93.7 kg.  -Weight Loss and Dietary Counseling given   Anemia of chronic kidney failure, stage 3 (moderate) (HCC) -Patient's Hb/Hct went from 11.7/36.4 -> 10.7/34.2 -> 10.0/32.0 -Check anemia panel in the a.m. -Continue to monitor for signs and symptoms of bleeding as she is anticoagulated and being transitioned to the heparin drip; no overt bleeding noted -Repeat CBC in the a.m.  Stage 3a chronic kidney disease (CKD) (HCC) -Patient's baseline creatinine is between 1.2 and 1.4 -BUN/creatinine went from 40/1.52 -> 33/1.32 -> 32/1.30 -> 32/1.36 -Continue monitor renal function as she is now had the addition of Cozaar -Avoid nephrotoxic medications, contrast dyes, hypotension and renally dose medications -CMP in a.m.  History of CVA (cerebrovascular accident) - Has had embolic strokes due to A-fib with multiple falls and subdural hematoma and bleeds -Continue supportive care and PT OT feel that she is a high fall risk -She will be discharged back to SNF once medically stable  and cleared by cardiology   S/P AVR - Had a repeat echocardiogram done which showed a post AVR with a 25 mm Saint Jude trivial closing volume central AR low-grade and normal function -Her aortic valve regurgitation is trivial and her aortic stenosis is present -Cardiology evaluated and felt that this was reassuring for her  Hypothyroidism- (present on admission) -C/w Levothyroxine 25 mcg p.o. daily  Bipolar disorder ([place])- (present on admission) -C/w Lamotrigine 100 mg p.o. daily and 200 mg p.o. nightly  Atrial fibrillation and flutter ([place])- (present on admission) -HR is improving and she had an ECHOCardiogram -Her Coumadin is being held and she is being started on a heparin drip given INR below her goal; INR is now 2.0 and Cardiology planning on Cath tomorrow -Continue to monitor on telemetry -Cardiology is evaluating and her A-fib is in the 70s up to the 110s 120s this morning -continue with carvedilol 12.5 mg p.o. twice daily -Further care per cardiology  GERD without esophagitis- (present on admission) -Continue with pantoprazole 40 mg p.o. daily  Essential hypertension- (present on admission) -Continue with Carvedilol 12.5 mils p.o. twice daily as well as 25 mg of losartan p.o. daily -Continue with hydralazine 10 mg IV every 6 as needed for systolic blood pressure greater than 150 -Continue to monitor blood pressures per protocol -Last blood pressure reading was 118/70  Acute on chronic combined systolic and diastolic CHF (congestive heart failure) ([place])- (present on admission) - Repeat echocardiogram done here showed an LVEF of 25 to 30% with severely decreased function in the left ventricle demonstrates global hypokinesis and left ventricular internal cavity size is moderately dilated with the ventricular diastolic parameters being indeterminate -She had 3 weeks of progressively worsening dyspnea with edema -Had elevated JVP on arrival and initially required BiPAP -BNP was  elevated at 854.4 on admission and trended down to 273.4 -Currently getting diuresis and has improving slightly -INR is 2.8 and cardiology is planning for right and left heart catheterization Friday and recommending stopping Coumadin, given vitamin K 2.5 mg p.o. today and starting a heparin drip -Continue with albuterol 3 mL IH every 6 as needed for wheezing or shortness of breath -Appreciate cardiology assistance and recommendations -Her diuresis has now been held we will continue to monitor her volume status -Continue strict I's and O's and daily weights; patient's I's and O's were inaccurate yesterday but have been corrected and now she is -2.685 L since admission -Weight is down from 213 pounds down to 206.57 yesterday and today is 209 -Continue to monitor for signs and symptoms of volume overload and repeat chest x-ray in the a.m.   Vitamin B 12 deficiency- (present on admission) -Continue with vitamin B12 1000 mcg p.o. daily  Hyperlipidemia associated with type 2 diabetes mellitus ([place])- (present on admission) -Continue with Rosuvastatin 40 mg p.o. daily as well as Fenofibrate 160 mg p.o. daily  Atrial fibrillation ([place])- (present on admission) -Has proximal atrial fibrillation with a CHA2DS2-VASc of greater than 5 and history of embolic strokes -He was getting anticoagulation with Coumadin which has not been held and switched to heparin drip in anticipation for cardiac cath; Plan is for Cath in the AM -Her last INR was 2.8 yesterday and today was 2.0 -  Cardiology increased her Coreg dosing-on 07/02/2021 -Continue to monitor on telemetry and appreciate further evaluation recommendation by cardiology   Subjective: Seen and examined at bedside she is feeling much better today than she was yesterday.  Denied any chest pain or shortness of breath.  No nausea or vomiting today.  No other concerns or complaints at this time.  Physical Exam: Vitals:   07/04/21 0600 07/04/21 0740 07/04/21  0925 07/04/21 0944  BP: 125/75  115/72 118/70  Pulse: 97 (!) 116 88 85  Resp: 20 (!) 23 16 (!) 22  Temp: 97.9 F (36.6 C)  98.2 F (36.8 C) 97.8 F (36.6 C)  TempSrc: Oral  Oral Oral  SpO2: 98% (!) 82% 95% 93%  Weight: 94.8 kg     Height:       Examination: Physical Exam:  Constitutional: WN/WD obese Caucasian female in NAD and appears calm and more comfortable Eyes: Lids and conjunctivae normal, sclerae anicteric  ENMT: External Ears, Nose appear normal. Grossly normal hearing. Mucous membranes are moist.  Neck: Appears normal, supple, no cervical masses, normal ROM, no appreciable thyromegaly; no JVD Respiratory: Diminished to auscultation bilaterally with coarse breath sounds, no wheezing, rales, rhonchi or crackles. Normal respiratory effort and patient is not tachypenic. No accessory muscle use. Unlabored breathing  Cardiovascular: RRR, no murmurs / rubs / gallops. S1 and S2 auscultated. Trace extremity edema Abdomen: Soft, non-tender, non-distended. Bowel sounds positive.  GU: Deferred. Musculoskeletal: No clubbing / cyanosis of digits/nails. No joint deformity upper and lower extremities.  Skin: No rashes, lesions, ulcers on a limited skin evaluation. No induration; Warm and dry.  Neurologic: CN 2-12 grossly intact with no focal deficits. Romberg sign and cerebellar reflexes not assessed.  Psychiatric: Normal judgment and insight. Alert and oriented x 3. Normal mood and appropriate affect.   Data Reviewed:  I have independently reviewed and interpreted the patient's CMP and CBC today  Her hemoglobin dropped a little bit today and is now 10.0.  She remains on a heparin drip and creatinine is relatively stable compared to last 4 days.  Family Communication: No family currently at bedside  Disposition: Status is: Inpatient Remains inpatient appropriate because: Will be undergoing cardiac cath in the morning   Planned Discharge Destination: Skilled nursing facility  DVT  Prophylaxis: Anticoagulated on heparin  Author: Raiford Noble, DO [facility] 07/04/2021 2:22 PM  For on call review [email or web address].

## 2021-07-04 NOTE — H&P (View-Only) (Signed)
Progress Note  Patient Name: [name] Date of Encounter: 07/04/2021  [facility] Cardiologist: Sinclair Grooms, MD  EP: Dr. Lovena Le  Subjective   [name] has been diuresed but continues to have episodes of sudden shortness of breath.  These are consistent with flash pulmonary edema possibly due to unstable angina/anginal equivalent.  We were hoping to do an ischemic evaluation when she was stabilized-perhaps as an outpatient but I think she will need to have a heart catheterization this admission.   Her INR has come down to 2.0. She will be ready for cath tomorrow Heparin to start soon    Inpatient Medications    Scheduled Meds:  aspirin  81 mg Oral Pre-Cath   carvedilol  12.5 mg Oral BID WC   cholecalciferol  1,000 Units Oral Daily   docusate sodium  100 mg Oral BID   fenofibrate  160 mg Oral Daily   lamoTRIgine  100 mg Oral Daily   lamoTRIgine  200 mg Oral QHS   levothyroxine  25 mcg Oral QAC breakfast   losartan  25 mg Oral Daily   pantoprazole  40 mg Oral Daily   rosuvastatin  40 mg Oral Daily   sodium chloride flush  3 mL Intravenous Q12H   sodium chloride flush  3 mL Intravenous Q12H   cyanocobalamin  1,000 mcg Oral Daily   Continuous Infusions:  sodium chloride     sodium chloride 10 mL/hr at 07/03/21 1641   heparin     PRN Meds: sodium chloride, acetaminophen **OR** acetaminophen, albuterol, bisacodyl, hydrALAZINE, melatonin, ondansetron **OR** ondansetron (ZOFRAN) IV, prochlorperazine, sodium chloride flush, SUMAtriptan   Vital Signs    Vitals:   07/03/21 1631 07/03/21 2146 07/04/21 0600 07/04/21 0740  BP: 121/73 (!) 104/45 125/75   Pulse: 77 70 97 (!) 116  Resp:  17 20 (!) 23  Temp:  98 F (36.7 C) 97.9 F (36.6 C)   TempSrc:  Oral Oral   SpO2:  97% 98% (!) 82%  Weight:   94.8 kg   Height:        Intake/Output Summary (Last 24 hours) at 07/04/2021 0852 Last data filed at 07/03/2021 1700 Gross per 24 hour  Intake 50 ml  Output 300  ml  Net -250 ml    Last 3 Weights 07/04/2021 07/03/2021 07/02/2021  Weight (lbs) 208 lb 15.9 oz 206 lb 9.1 oz 205 lb 6.4 oz  Weight (kg) 94.8 kg 93.7 kg 93.169 kg      Telemetry       ECG    N/a  Physical Exam   Physical Exam: Blood pressure 125/75, pulse (!) 116, temperature 97.9 F (36.6 C), temperature source Oral, resp. rate (!) 23, height 5\' 8"  (1.727 m), weight 94.8 kg, SpO2 (!) 82 %.  GEN:  Well nourished, well developed in no acute distress HEENT: Normal NECK: No JVD; No carotid bruits LYMPHATICS: No lymphadenopathy CARDIAC: Irreg. Irreg. , no murmurs, rubs, gallops RESPIRATORY:  Clear to auscultation without rales, wheezing or rhonchi  ABDOMEN: Soft, non-tender, non-distended MUSCULOSKELETAL:  No edema; No deformity  SKIN: Warm and dry NEUROLOGIC:  Alert and oriented x 3   Labs    High Sensitivity Troponin:   Recent Labs  Lab 06/27/21 1443  TROPONINIHS 54*      Chemistry Recent Labs  Lab 06/30/21 0248 07/01/21 0645 07/02/21 0558 07/03/21 0501 07/04/21 0417  NA 140 139 140 140 141  K 3.9 4.2 3.9 4.3 4.4  CL 103  103 105 105 104  CO2 27 26 26 26 29   GLUCOSE 146* 136* 123* 103* 136*  BUN 40* 32* 33* 32* 32*  CREATININE 1.52* 1.37* 1.32* 1.30* 1.36*  CALCIUM 10.3 9.7 9.6 9.5 9.5  MG 2.0 1.8 1.7 1.8 2.0  PROT 6.8 7.1  --   --  6.3*  ALBUMIN 3.7 4.0  --   --  3.5  AST 18 20  --   --  16  ALT 10 13  --   --  11  ALKPHOS 32* 30*  --   --  25*  BILITOT 0.6 0.6  --   --  0.5  GFRNONAA 37* 42* 43* 44* 42*  ANIONGAP 10 10 9 9 8      Hematology Recent Labs  Lab 07/01/21 0645 07/03/21 1042 07/04/21 0417  WBC 7.7 6.9 5.8  RBC 3.92 3.64* 3.35*  HGB 11.7* 10.7* 10.0*  HCT 36.4 34.2* 32.0*  MCV 92.9 94.0 95.5  MCH 29.8 29.4 29.9  MCHC 32.1 31.3 31.3  RDW 14.9 14.6 14.8  PLT 330 300 250    Thyroid  Recent Labs  Lab 06/27/21 1835  TSH 2.578     BNP Recent Labs  Lab 06/29/21 0242 06/30/21 0248 07/01/21 0645  BNP 432.2* 263.0* 273.4*      DDimer No results for input(s): DDIMER in the last 168 hours.   Radiology    No results found.  Cardiac Studies   Echo  1. EF has decreased since echo done 2021 . Left ventricular ejection  fraction, by estimation, is 25 to 30%. The left ventricle has severely  decreased function. The left ventricle demonstrates global hypokinesis.  The left ventricular internal cavity size   was moderately dilated. Left ventricular diastolic parameters are  indeterminate.   2. Pacing wires in RV/RA. Right ventricular systolic function is normal.  The right ventricular size is normal. There is mildly elevated pulmonary  artery systolic pressure.   3. Left atrial size was moderately dilated.   4. The mitral valve is degenerative. Mild mitral valve regurgitation. No  evidence of mitral stenosis. Moderate mitral annular calcification.   5. Tricuspid valve regurgitation is mild to moderate.   6. Post AVR with 25 mm St Jude trivial closing volume central AR low  gradients normal function . The aortic valve has been repaired/replaced.  Aortic valve regurgitation is trivial. No aortic stenosis is present.  There is a 25 mm St. Jude mechanical  valve present in the aortic position.   7. The inferior vena cava is normal in size with greater than 50%  respiratory variability, suggesting right atrial pressure of 3 mmHg.   Patient Profile     71 y.o. female with a hx of bicuspid AV/AS with ascending aortic aneurysm s/p resection/grafting of ascending aortic aneurysm and placement of St. Jude mechanical AV conduit/[place] 2009, atrial fibrillation/flutter, bradycardia s/p Bos Sci PPM implantation 12/2019, multiple falls with prior craniotomy for subdural hematoma/subarachnoid hemorrhage, prior chronic infarcts on MRI 2021, bipolar disorder, chronic diastolic CHF (prior LVEF 63-84% in 2021, improved to 50-55% in 03/2020), anemia, depression, debilitation (wheelchair bound), DM, HLD, HTN, mild carotid artery  disease 2021, back pain, obesity who is being seen  for the evaluation of CHF at the request of Cynthia Goad PA-C.  Assessment & Plan    Acute combined CHF - 3 weeks hx of progressive worsening dyspnea with edema. Elevated JVD on arrival. Initially required BiPAP in the ED. BNP 854 >> 743.  She  has not had severe episodes of dyspnea.    Plan is for cath tomorrow     2. Atrial fibrillation  HR is better ,  cont meds  3.  S/p AVR -Reassuring echocardiogram Holding warfarin for cath on Friday Start heparin     4.  Chronic kidney disease stage III  .   For questions or updates, please contact [place] Please consult [email or web address] for contact info under      Mertie Moores, MD  07/04/2021 9:33 AM    [address],  [place], [place]  [address] Phone: [phone]; Fax: [phone]

## 2021-07-04 NOTE — Progress Notes (Signed)
ANTICOAGULATION CONSULT NOTE  Pharmacy Consult for heparin  Indication: atrial fibrillation  Allergies  Allergen Reactions   Mucinex [Guaifenesin Er] Other (See Comments)    Severe headaches    Keflex [Cephalexin] Other (See Comments)    Headaches and dizziness   Keflex [Cephalexin] Other (See Comments)    Headaches and/or dizziness- "allergic," per [facility]   Mucinex [Guaifenesin Er] Other (See Comments)    Severe headaches- "allergic," per [facility]   Sulfa Antibiotics Other (See Comments)    headaches   Sulfa Antibiotics Other (See Comments)    Headaches- "allergic," per [facility]    Sulfonamide Derivatives Other (See Comments)    Headaches     Patient Measurements: Height: 5\' 8"  (172.7 cm) Weight: 94.8 kg (208 lb 15.9 oz) IBW/kg (Calculated) : 63.9 Heparin Dosing Weight: 84.9 kg   Vital Signs: Temp: 98.7 F (37.1 C) (02/09 1459) Temp Source: Oral (02/09 1459) BP: 142/92 (02/09 1758) Pulse Rate: 78 (02/09 1758)  Labs: Recent Labs    07/02/21 0310 07/02/21 0558 07/03/21 0501 07/03/21 1042 07/04/21 0417 07/04/21 1755  HGB  --   --   --  10.7* 10.0*  --   HCT  --   --   --  34.2* 32.0*  --   PLT  --   --   --  300 250  --   LABPROT 30.9*  --  29.7*  --  22.5*  --   INR 3.0*  --  2.8*  --  2.0*  --   HEPARINUNFRC  --   --   --   --   --  0.31  CREATININE  --  1.32* 1.30*  --  1.36*  --      Estimated Creatinine Clearance: 46.4 mL/min (A) (by C-G formula based on SCr of 1.36 mg/dL (H)).   Medical History: Past Medical History:  Diagnosis Date   ANEMIA 05/28/2010   AORTIC VALVE REPLACEMENT, HX OF 03/11/2010   Mechanical prosthesis   Ascending aortic aneurysm    Atrial fibrillation (HCC)    Atrial flutter ([place])    BIPOLAR AFFECTIVE DISORDER 03/11/2010   Bradycardia    Chronic diastolic CHF (congestive heart failure) (HCC)    Depression    Diabetes ([place])    Diverticulitis 12/18/2010   Falls    FIBROIDS, UTERUS 03/11/2010   Gout 09/04/2016   Grave's disease  12/2010   Hyperlipidemia associated with type 2 diabetes mellitus ([place]) 06/05/2016   Hypertension    Low back pain 11/02/2017   Migraine 06/05/2016   Mixed hyperlipidemia 03/11/2010   Obesity    Pacemaker    Prolonged QT interval    SAH (subarachnoid hemorrhage) ([place])    Skin cancer    Subdural hematoma    Syncope 08/22/2019   UTI (lower urinary tract infection) 08/14/2011    Assessment: 70 YOF with h/o recurrent AF and h/o mechanical AV valve replacement on warfarin at facility. Pharmacy consulted to dose heparin (to start when INR < 2.5).   Warfarin regimen PTA: 3.5 mg daily (recently reduced from 4mg  daily), total weekly dose 24.5mg    INR is 2 today - started on heparin infusion. Initial heparin level is therapeutic at 0.31, on 1200 units/hr. No s/sx of bleeding or infusion issues per RN. Plans are noted for R/L hearth cath on 2/10.   Goal of Therapy:  Heparin level 0.3-0.7 Monitor platelets by anticoagulation protocol: Yes   Plan:  -Increase heparin slightly to 1250 units/hr to keep in goal range  -Confirm  heparin level with AM labs -Monitor daily HL, CBC, and for s/sx of bleeding   Antonietta Jewel, PharmD, BCCCP Clinical Pharmacist  Phone: [phone] 07/04/2021 6:42 PM  Please check AMION for all [place] phone numbers After 10:00 PM, call [place] [phone]

## 2021-07-05 ENCOUNTER — Encounter (HOSPITAL_COMMUNITY): Payer: Self-pay | Admitting: Cardiovascular Disease

## 2021-07-05 ENCOUNTER — Encounter (HOSPITAL_COMMUNITY)
Admission: EM | Disposition: A | Discharge status: Skilled Nursing Facility | Payer: Self-pay | Source: Skilled Nursing Facility | Attending: Internal Medicine

## 2021-07-05 ENCOUNTER — Inpatient Hospital Stay (HOSPITAL_COMMUNITY): Payer: HMO

## 2021-07-05 DIAGNOSIS — J9601 Acute respiratory failure with hypoxia: Secondary | ICD-10-CM | POA: Diagnosis not present

## 2021-07-05 DIAGNOSIS — I4891 Unspecified atrial fibrillation: Secondary | ICD-10-CM | POA: Diagnosis not present

## 2021-07-05 DIAGNOSIS — I5043 Acute on chronic combined systolic (congestive) and diastolic (congestive) heart failure: Secondary | ICD-10-CM | POA: Diagnosis not present

## 2021-07-05 DIAGNOSIS — I10 Essential (primary) hypertension: Secondary | ICD-10-CM | POA: Diagnosis not present

## 2021-07-05 HISTORY — PX: RIGHT HEART CATH AND CORONARY ANGIOGRAPHY: CATH118264

## 2021-07-05 LAB — CBC WITH DIFFERENTIAL/PLATELET
Abs Immature Granulocytes: 0 10*3/uL (ref 0.00–0.07)
Basophils Absolute: 0.1 10*3/uL (ref 0.0–0.1)
Basophils Relative: 2 %
Eosinophils Absolute: 0.2 10*3/uL (ref 0.0–0.5)
Eosinophils Relative: 4 %
HCT: 32 % — ABNORMAL LOW (ref 36.0–46.0)
Hemoglobin: 9.9 g/dL — ABNORMAL LOW (ref 12.0–15.0)
Immature Granulocytes: 0 %
Lymphocytes Relative: 32 %
Lymphs Abs: 1.7 10*3/uL (ref 0.7–4.0)
MCH: 29.4 pg (ref 26.0–34.0)
MCHC: 30.9 g/dL (ref 30.0–36.0)
MCV: 95 fL (ref 80.0–100.0)
Monocytes Absolute: 0.4 10*3/uL (ref 0.1–1.0)
Monocytes Relative: 8 %
Neutro Abs: 2.9 10*3/uL (ref 1.7–7.7)
Neutrophils Relative %: 54 %
Platelets: 248 10*3/uL (ref 150–400)
RBC: 3.37 MIL/uL — ABNORMAL LOW (ref 3.87–5.11)
RDW: 14.6 % (ref 11.5–15.5)
WBC: 5.2 10*3/uL (ref 4.0–10.5)
nRBC: 0 % (ref 0.0–0.2)

## 2021-07-05 LAB — COMPREHENSIVE METABOLIC PANEL
ALT: 11 U/L (ref 0–44)
AST: 18 U/L (ref 15–41)
Albumin: 3.5 g/dL (ref 3.5–5.0)
Alkaline Phosphatase: 29 U/L — ABNORMAL LOW (ref 38–126)
Anion gap: 8 (ref 5–15)
BUN: 34 mg/dL — ABNORMAL HIGH (ref 8–23)
CO2: 27 mmol/L (ref 22–32)
Calcium: 9.3 mg/dL (ref 8.9–10.3)
Chloride: 105 mmol/L (ref 98–111)
Creatinine, Ser: 1.45 mg/dL — ABNORMAL HIGH (ref 0.44–1.00)
GFR, Estimated: 39 mL/min — ABNORMAL LOW (ref >60)
Glucose, Bld: 138 mg/dL — ABNORMAL HIGH (ref 70–99)
Potassium: 3.9 mmol/L (ref 3.5–5.1)
Sodium: 140 mmol/L (ref 135–145)
Total Bilirubin: 0.6 mg/dL (ref 0.3–1.2)
Total Protein: 6.5 g/dL (ref 6.5–8.1)

## 2021-07-05 LAB — POCT I-STAT EG7
Acid-Base Excess: 0 mmol/L (ref 0.0–2.0)
Bicarbonate: 26.1 mmol/L (ref 20.0–28.0)
Calcium, Ion: 1.24 mmol/L (ref 1.15–1.40)
HCT: 32 % — ABNORMAL LOW (ref 36.0–46.0)
Hemoglobin: 10.9 g/dL — ABNORMAL LOW (ref 12.0–15.0)
O2 Saturation: 65 %
Potassium: 4 mmol/L (ref 3.5–5.1)
Sodium: 143 mmol/L (ref 135–145)
TCO2: 28 mmol/L (ref 22–32)
pCO2, Ven: 49.5 mmHg (ref 44.0–60.0)
pH, Ven: 7.33 (ref 7.250–7.430)
pO2, Ven: 37 mmHg (ref 32.0–45.0)

## 2021-07-05 LAB — POCT I-STAT 7, (LYTES, BLD GAS, ICA,H+H)
Acid-Base Excess: 0 mmol/L (ref 0.0–2.0)
Bicarbonate: 25.9 mmol/L (ref 20.0–28.0)
Calcium, Ion: 1.29 mmol/L (ref 1.15–1.40)
HCT: 32 % — ABNORMAL LOW (ref 36.0–46.0)
Hemoglobin: 10.9 g/dL — ABNORMAL LOW (ref 12.0–15.0)
O2 Saturation: 93 %
Potassium: 4.1 mmol/L (ref 3.5–5.1)
Sodium: 142 mmol/L (ref 135–145)
TCO2: 27 mmol/L (ref 22–32)
pCO2 arterial: 47.5 mmHg (ref 32.0–48.0)
pH, Arterial: 7.345 — ABNORMAL LOW (ref 7.350–7.450)
pO2, Arterial: 72 mmHg — ABNORMAL LOW (ref 83.0–108.0)

## 2021-07-05 LAB — HEPARIN LEVEL (UNFRACTIONATED): Heparin Unfractionated: 0.49 IU/mL (ref 0.30–0.70)

## 2021-07-05 LAB — PHOSPHORUS: Phosphorus: 4.1 mg/dL (ref 2.5–4.6)

## 2021-07-05 LAB — PROTIME-INR
INR: 1.3 — ABNORMAL HIGH (ref 0.8–1.2)
Prothrombin Time: 15.8 seconds — ABNORMAL HIGH (ref 11.4–15.2)

## 2021-07-05 LAB — MAGNESIUM: Magnesium: 1.8 mg/dL (ref 1.7–2.4)

## 2021-07-05 SURGERY — RIGHT HEART CATH AND CORONARY ANGIOGRAPHY
Anesthesia: LOCAL

## 2021-07-05 MED ORDER — LABETALOL HCL 5 MG/ML IV SOLN: 10.0000 mg | INTRAVENOUS | Status: AC | PRN

## 2021-07-05 MED ORDER — FUROSEMIDE 40 MG PO TABS
40.0000 mg | ORAL_TABLET | Freq: Every day | ORAL | Status: DC
  Administered 2021-07-05 – 2021-07-07 (×3): 40 mg via ORAL
  Filled 2021-07-05 (×3): qty 1

## 2021-07-05 MED ORDER — WARFARIN - PHARMACIST DOSING INPATIENT: Freq: Every day | Status: DC

## 2021-07-05 MED ORDER — HEPARIN (PORCINE) IN NACL 1000-0.9 UT/500ML-% IV SOLN
INTRAVENOUS | Status: DC | PRN
  Administered 2021-07-05 (×2): 500 mL

## 2021-07-05 MED ORDER — POTASSIUM CHLORIDE CRYS ER 20 MEQ PO TBCR
20.0000 meq | EXTENDED_RELEASE_TABLET | Freq: Every day | ORAL | Status: DC
  Administered 2021-07-05 – 2021-07-07 (×3): 20 meq via ORAL
  Filled 2021-07-05 (×3): qty 1

## 2021-07-05 MED ORDER — WARFARIN SODIUM 5 MG PO TABS
5.0000 mg | ORAL_TABLET | Freq: Once | ORAL | Status: AC
  Administered 2021-07-05: 5 mg via ORAL
  Filled 2021-07-05: qty 1

## 2021-07-05 MED ORDER — VERAPAMIL HCL 2.5 MG/ML IV SOLN
INTRAVENOUS | Status: AC
  Filled 2021-07-05: qty 2

## 2021-07-05 MED ORDER — VERAPAMIL HCL 2.5 MG/ML IV SOLN
INTRAVENOUS | Status: DC | PRN
  Administered 2021-07-05: 10 mL via INTRA_ARTERIAL

## 2021-07-05 MED ORDER — SODIUM CHLORIDE 0.9% FLUSH
3.0000 mL | Freq: Two times a day (BID) | INTRAVENOUS | Status: DC
  Administered 2021-07-06: 3 mL via INTRAVENOUS

## 2021-07-05 MED ORDER — SODIUM CHLORIDE 0.9 % IV SOLN: 250.0000 mL | INTRAVENOUS | Status: DC | PRN

## 2021-07-05 MED ORDER — FENTANYL CITRATE (PF) 100 MCG/2ML IJ SOLN
INTRAMUSCULAR | Status: DC | PRN
  Administered 2021-07-05: 25 ug via INTRAVENOUS

## 2021-07-05 MED ORDER — LIDOCAINE HCL (PF) 1 % IJ SOLN
INTRAMUSCULAR | Status: DC | PRN
  Administered 2021-07-05 (×2): 2 mL
  Administered 2021-07-05: 15 mL

## 2021-07-05 MED ORDER — HEPARIN (PORCINE) IN NACL 1000-0.9 UT/500ML-% IV SOLN
INTRAVENOUS | Status: AC
  Filled 2021-07-05: qty 1000

## 2021-07-05 MED ORDER — HEPARIN (PORCINE) 25000 UT/250ML-% IV SOLN
1300.0000 [IU]/h | INTRAVENOUS | Status: AC
  Administered 2021-07-05: 1250 [IU]/h via INTRAVENOUS
  Administered 2021-07-06 (×2): 1350 [IU]/h via INTRAVENOUS
  Filled 2021-07-05 (×3): qty 250

## 2021-07-05 MED ORDER — FENTANYL CITRATE (PF) 100 MCG/2ML IJ SOLN
INTRAMUSCULAR | Status: AC
  Filled 2021-07-05: qty 2

## 2021-07-05 MED ORDER — MIDAZOLAM HCL 2 MG/2ML IJ SOLN
INTRAMUSCULAR | Status: AC
  Filled 2021-07-05: qty 2

## 2021-07-05 MED ORDER — SODIUM CHLORIDE 0.9% FLUSH: 3.0000 mL | INTRAVENOUS | Status: DC | PRN

## 2021-07-05 MED ORDER — IOHEXOL 350 MG/ML SOLN
INTRAVENOUS | Status: DC | PRN
  Administered 2021-07-05: 50 mL

## 2021-07-05 MED ORDER — HEPARIN SODIUM (PORCINE) 1000 UNIT/ML IJ SOLN
INTRAMUSCULAR | Status: AC
  Filled 2021-07-05: qty 10

## 2021-07-05 MED ORDER — LIDOCAINE HCL (PF) 1 % IJ SOLN
INTRAMUSCULAR | Status: AC
  Filled 2021-07-05: qty 30

## 2021-07-05 MED ORDER — MIDAZOLAM HCL 2 MG/2ML IJ SOLN
INTRAMUSCULAR | Status: DC | PRN
  Administered 2021-07-05: 2 mg via INTRAVENOUS

## 2021-07-05 SURGICAL SUPPLY — 17 items
CATH 5FR JL3.5 JR4 ANG PIG MP (CATHETERS) ×1 IMPLANT
CATH BALLN WEDGE 5F 110CM (CATHETERS) ×1 IMPLANT
CATH INFINITI 5FR JL4 (CATHETERS) ×1 IMPLANT
CLOSURE PERCLOSE PROSTYLE (VASCULAR PRODUCTS) ×1 IMPLANT
DEVICE RAD COMP TR BAND LRG (VASCULAR PRODUCTS) ×1 IMPLANT
GLIDESHEATH SLEND SS 6F .021 (SHEATH) ×1 IMPLANT
GUIDEWIRE INQWIRE 1.5J.035X260 (WIRE) IMPLANT
INQWIRE 1.5J .035X260CM (WIRE) ×2
KIT ESSENTIALS PG (KITS) ×1 IMPLANT
KIT HEART LEFT (KITS) ×2 IMPLANT
PACK CARDIAC CATHETERIZATION (CUSTOM PROCEDURE TRAY) ×2 IMPLANT
SHEATH GLIDE SLENDER 4/5FR (SHEATH) ×1 IMPLANT
SHEATH PINNACLE 5F 10CM (SHEATH) ×1 IMPLANT
TRANSDUCER W/STOPCOCK (MISCELLANEOUS) ×2 IMPLANT
TUBING CIL FLEX 10 FLL-RA (TUBING) ×2 IMPLANT
WIRE HI TORQ VERSACORE-J 145CM (WIRE) ×1 IMPLANT
WIRE MICRO SET SILHO 5FR 7 (SHEATH) ×1 IMPLANT

## 2021-07-05 NOTE — Assessment & Plan Note (Addendum)
-  PT/OT recommending returning to SNF with PT/OT and will discharge once stable from a cardiac perspective

## 2021-07-05 NOTE — Progress Notes (Signed)
Progress Note  Patient Name: [name] Date of Encounter: 07/05/2021  [facility] Cardiologist: Sinclair Grooms, MD  EP: Dr. Lovena Le  Subjective   [name] has been diuresed but continues to have episodes of sudden shortness of breath.  These are consistent with flash pulmonary edema possibly due to unstable angina/anginal equivalent.   INR is 1.3.  She is ready for cath.   Inpatient Medications    Scheduled Meds:  [MAR Hold] carvedilol  12.5 mg Oral BID WC   [MAR Hold] cholecalciferol  1,000 Units Oral Daily   [MAR Hold] docusate sodium  100 mg Oral BID   [MAR Hold] fenofibrate  160 mg Oral Daily   [MAR Hold] lamoTRIgine  100 mg Oral Daily   [MAR Hold] lamoTRIgine  200 mg Oral QHS   [MAR Hold] levothyroxine  25 mcg Oral QAC breakfast   [MAR Hold] losartan  25 mg Oral Daily   [MAR Hold] pantoprazole  40 mg Oral Daily   [MAR Hold] rosuvastatin  40 mg Oral Daily   [MAR Hold] sodium chloride flush  3 mL Intravenous Q12H   sodium chloride flush  3 mL Intravenous Q12H   [MAR Hold] cyanocobalamin  1,000 mcg Oral Daily   Continuous Infusions:  sodium chloride     sodium chloride 10 mL/hr at 07/03/21 1641   heparin 1,250 Units/hr (07/05/21 0444)   PRN Meds: sodium chloride, [MAR Hold] acetaminophen **OR** [MAR Hold] acetaminophen, [MAR Hold] albuterol, [MAR Hold] bisacodyl, [MAR Hold] hydrALAZINE, [MAR Hold] melatonin, [MAR Hold] ondansetron **OR** [MAR Hold] ondansetron (ZOFRAN) IV, [MAR Hold] prochlorperazine, sodium chloride flush, [MAR Hold] SUMAtriptan   Vital Signs    Vitals:   07/04/21 2020 07/04/21 2125 07/05/21 0346 07/05/21 0400  BP: 124/65 121/77 121/63   Pulse: 74  74   Resp: 19 20 19    Temp: 98 F (36.7 C) 97.7 F (36.5 C) 97.7 F (36.5 C)   TempSrc: Oral Oral Oral   SpO2: 98% 92% 95%   Weight:    94 kg  Height:        Intake/Output Summary (Last 24 hours) at 07/05/2021 1043 Last data filed at 07/05/2021 0300 Gross per 24 hour  Intake  811.86 ml  Output 500 ml  Net 311.86 ml    Last 3 Weights 07/05/2021 07/04/2021 07/03/2021  Weight (lbs) 207 lb 3.7 oz 208 lb 15.9 oz 206 lb 9.1 oz  Weight (kg) 94 kg 94.8 kg 93.7 kg      Telemetry       ECG    N/a  Physical Exam   Physical Exam: Blood pressure 121/63, pulse 74, temperature 97.7 F (36.5 C), temperature source Oral, resp. rate 19, height 5\' 8"  (1.727 m), weight 94 kg, SpO2 95 %.  GEN: Middle-aged female, no acute distress HEENT: Normal NECK: No JVD; No carotid bruits LYMPHATICS: No lymphadenopathy CARDIAC: Irregularly irregular.  Mechanical S2,  soft systolic murmur RESPIRATORY:  Clear to auscultation without rales, wheezing or rhonchi  ABDOMEN: Soft, non-tender, non-distended MUSCULOSKELETAL: No edema SKIN: Warm and dry NEUROLOGIC:  Alert and oriented x 3   Labs    High Sensitivity Troponin:   Recent Labs  Lab 06/27/21 1443  TROPONINIHS 54*      Chemistry Recent Labs  Lab 07/01/21 0645 07/02/21 0558 07/03/21 0501 07/04/21 0417 07/05/21 0216  NA 139   < > 140 141 140  K 4.2   < > 4.3 4.4 3.9  CL 103   < > 105  104 105  CO2 26   < > 26 29 27   GLUCOSE 136*   < > 103* 136* 138*  BUN 32*   < > 32* 32* 34*  CREATININE 1.37*   < > 1.30* 1.36* 1.45*  CALCIUM 9.7   < > 9.5 9.5 9.3  MG 1.8   < > 1.8 2.0 1.8  PROT 7.1  --   --  6.3* 6.5  ALBUMIN 4.0  --   --  3.5 3.5  AST 20  --   --  16 18  ALT 13  --   --  11 11  ALKPHOS 30*  --   --  25* 29*  BILITOT 0.6  --   --  0.5 0.6  GFRNONAA 42*   < > 44* 42* 39*  ANIONGAP 10   < > 9 8 8    < > = values in this interval not displayed.     Hematology Recent Labs  Lab 07/03/21 1042 07/04/21 0417 07/05/21 0216  WBC 6.9 5.8 5.2  RBC 3.64* 3.35* 3.37*  HGB 10.7* 10.0* 9.9*  HCT 34.2* 32.0* 32.0*  MCV 94.0 95.5 95.0  MCH 29.4 29.9 29.4  MCHC 31.3 31.3 30.9  RDW 14.6 14.8 14.6  PLT 300 250 248    Thyroid  No results for input(s): TSH, FREET4 in the last 168 hours.   BNP Recent Labs   Lab 06/29/21 0242 06/30/21 0248 07/01/21 0645  BNP 432.2* 263.0* 273.4*     DDimer No results for input(s): DDIMER in the last 168 hours.   Radiology    DG CHEST PORT 1 VIEW  Result Date: 07/05/2021 CLINICAL DATA:  Dyspnea. EXAM: PORTABLE CHEST 1 VIEW COMPARISON:  Portable chest 06/27/2021. FINDINGS: The heart is enlarged. There is an aortic valve prosthesis, old median sternotomy sutures and dual lead left chest pacing system with stable wires. There are overlying monitor wires on the right. The lungs show mild chronic changes but are clear of infiltrates. No pleural collection is seen. Osteopenia. Right axillary surgical clips. Interval resolution of the prior CHF findings and pleural effusions. Slight elevation right diaphragm.  Osteopenia. IMPRESSION: No evidence of acute chest disease. Cardiomegaly with aortic valve prosthesis and pacemaker. Electronically Signed   By: Telford Nab M.D.   On: 07/05/2021 07:04    Cardiac Studies   Echo  1. EF has decreased since echo done 2021 . Left ventricular ejection  fraction, by estimation, is 25 to 30%. The left ventricle has severely  decreased function. The left ventricle demonstrates global hypokinesis.  The left ventricular internal cavity size   was moderately dilated. Left ventricular diastolic parameters are  indeterminate.   2. Pacing wires in RV/RA. Right ventricular systolic function is normal.  The right ventricular size is normal. There is mildly elevated pulmonary  artery systolic pressure.   3. Left atrial size was moderately dilated.   4. The mitral valve is degenerative. Mild mitral valve regurgitation. No  evidence of mitral stenosis. Moderate mitral annular calcification.   5. Tricuspid valve regurgitation is mild to moderate.   6. Post AVR with 25 mm St Jude trivial closing volume central AR low  gradients normal function . The aortic valve has been repaired/replaced.  Aortic valve regurgitation is trivial. No aortic  stenosis is present.  There is a 25 mm St. Jude mechanical  valve present in the aortic position.   7. The inferior vena cava is normal in size with greater than 50%  respiratory  variability, suggesting right atrial pressure of 3 mmHg.   Patient Profile     71 y.o. female with a hx of bicuspid AV/AS with ascending aortic aneurysm s/p resection/grafting of ascending aortic aneurysm and placement of St. Jude mechanical AV conduit/[place] 2009, atrial fibrillation/flutter, bradycardia s/p Bos Sci PPM implantation 12/2019, multiple falls with prior craniotomy for subdural hematoma/subarachnoid hemorrhage, prior chronic infarcts on MRI 2021, bipolar disorder, chronic diastolic CHF (prior LVEF 10-62% in 2021, improved to 50-55% in 03/2020), anemia, depression, debilitation (wheelchair bound), DM, HLD, HTN, mild carotid artery disease 2021, back pain, obesity who is being seen  for the evaluation of CHF at the request of Benedetto Goad PA-C.  Assessment & Plan    Acute combined CHF - 3 weeks hx of progressive worsening dyspnea with edema. Elevated JVD on arrival. Initially required BiPAP in the ED. BNP 854 >> 743.  She is scheduled for heart catheterization today.  We have already discussed the risk, benefits, options of heart catheterization.  She understands and agrees to proceed.     2. Atrial fibrillation  INR is 1.3.  She is on IV heparin.     3.  S/p AVR -Reassuring echocardiogram She is on IV heparin coverage while she is off her warfarin.     4.  Chronic kidney disease stage III  .   For questions or updates, please contact [place] Please consult [email or web address] for contact info under      Mertie Moores, MD  07/05/2021 10:43 AM    [address],  [place] [place], [place]  [address] Phone: [phone]; Fax: [phone]

## 2021-07-05 NOTE — Interval H&P Note (Signed)
History and Physical Interval Note:  07/05/2021 10:37 AM  [name]  has presented today for surgery, with the diagnosis of heart failure.  The various methods of treatment have been discussed with the patient and family. After consideration of risks, benefits and other options for treatment, the patient has consented to  Procedure(s): RIGHT/LEFT HEART CATH AND CORONARY ANGIOGRAPHY (N/A) as a surgical intervention.  The patient's history has been reviewed, patient examined, no change in status, stable for surgery.  I have reviewed the patient's chart and labs.  Questions were answered to the patient's satisfaction.     Sherren Mocha

## 2021-07-05 NOTE — Progress Notes (Signed)
° ° °  Cath showed non obstructive cad Elevated filling pressures Will start lasix 40 mg a po daily, kdur 20 meq daily  She should be able to go back to [facility] soon    Mertie Moores, MD  07/05/2021 5:06 PM    [facility] [place],  [address] [place], [place]  [address] Phone: [phone]; Fax: [phone]

## 2021-07-05 NOTE — Progress Notes (Signed)
Mobility Specialist Progress Note    07/05/21 1614  Mobility  Bed Position Chair  Activity Transferred from bed to chair  Level of Assistance Minimal assist, patient does 75% or more  Assistive Device  (HHA)  Distance Ambulated (ft) 3 ft  Activity Response Tolerated fair  $Mobility charge 1 Mobility   Left with call bell in reach and chair alarm on.   [facility] Mobility Specialist  M.S. 5N: [phone]

## 2021-07-05 NOTE — Progress Notes (Addendum)
ANTICOAGULATION CONSULT NOTE  Pharmacy Consult for heparin  Indication: atrial fibrillation  Allergies  Allergen Reactions   Mucinex [Guaifenesin Er] Other (See Comments)    Severe headaches    Keflex [Cephalexin] Other (See Comments)    Headaches and dizziness   Keflex [Cephalexin] Other (See Comments)    Headaches and/or dizziness- "allergic," per [facility]   Mucinex [Guaifenesin Er] Other (See Comments)    Severe headaches- "allergic," per [facility]   Sulfa Antibiotics Other (See Comments)    headaches   Sulfa Antibiotics Other (See Comments)    Headaches- "allergic," per [facility]    Sulfonamide Derivatives Other (See Comments)    Headaches     Patient Measurements: Height: 5\' 8"  (172.7 cm) Weight: 94 kg (207 lb 3.7 oz) IBW/kg (Calculated) : 63.9 Heparin Dosing Weight: 84.9 kg   Vital Signs: Temp: 97.7 F (36.5 C) (02/10 0346) Temp Source: Oral (02/10 0346) BP: 129/78 (02/10 1130) Pulse Rate: 76 (02/10 1130)  Labs: Recent Labs    07/03/21 0501 07/03/21 1042 07/03/21 1042 07/04/21 0417 07/04/21 1755 07/05/21 0216  HGB  --  10.7*   < > 10.0*  --  9.9*  HCT  --  34.2*  --  32.0*  --  32.0*  PLT  --  300  --  250  --  248  LABPROT 29.7*  --   --  22.5*  --  15.8*  INR 2.8*  --   --  2.0*  --  1.3*  HEPARINUNFRC  --   --   --   --  0.31 0.49  CREATININE 1.30*  --   --  1.36*  --  1.45*   < > = values in this interval not displayed.     Estimated Creatinine Clearance: 43.3 mL/min (A) (by C-G formula based on SCr of 1.45 mg/dL (H)).   Medical History: Past Medical History:  Diagnosis Date   ANEMIA 05/28/2010   AORTIC VALVE REPLACEMENT, HX OF 03/11/2010   Mechanical prosthesis   Ascending aortic aneurysm    Atrial fibrillation (HCC)    Atrial flutter ([place])    BIPOLAR AFFECTIVE DISORDER 03/11/2010   Bradycardia    Chronic diastolic CHF (congestive heart failure) (HCC)    Depression    Diabetes ([place])    Diverticulitis 12/18/2010   Falls    FIBROIDS, UTERUS  03/11/2010   Gout 09/04/2016   Grave's disease 12/2010   Hyperlipidemia associated with type 2 diabetes mellitus ([place]) 06/05/2016   Hypertension    Low back pain 11/02/2017   Migraine 06/05/2016   Mixed hyperlipidemia 03/11/2010   Obesity    Pacemaker    Prolonged QT interval    SAH (subarachnoid hemorrhage) ([place])    Skin cancer    Subdural hematoma    Syncope 08/22/2019   UTI (lower urinary tract infection) 08/14/2011    Assessment: 65 YOF with h/o recurrent AF and h/o mechanical AV valve replacement on warfarin at facility. Pharmacy consulted to dose heparin (to start when INR < 2.5).   He is now s/p cath and heparin to restart 8 hours post sheath (removed ~ 11:30am( -INR= 1.3 (s/p 2.5mg  vitamin K on 2/8)  Warfarin regimen PTA: 3.5 mg daily (recently reduced from 4mg  daily), total weekly dose 24.5mg     Goal of Therapy:  Heparin level 0.3-0.7 INR goal 2-5-3.5 Monitor platelets by anticoagulation protocol: Yes   Plan:  -Restart heparin 1250 units/hr at 7:30pm  -Warfarin 5mg  po today -Monitor daily HL, CBC and INR  Mitzi Hansen  Bjorn Loser, PharmD Clinical Pharmacist **Pharmacist phone directory can now be found on [email or web address] ([email or web address]).  Listed under [place].

## 2021-07-05 NOTE — Progress Notes (Signed)
Progress Note   Patient: [name] STM:[id number] DOB: 25-Sep-1950 DOA: 06/27/2021     8 DOS: the patient was seen and examined on 07/05/2021   Brief hospital course: The patient is a 71 year old obese Caucasian female with a past medical history significant for breast admitted to [facility] aortic valve that was placed in 2011, PAF with CHA2DS2-VASc score of greater than 5 and history of embolic strokes and history of multiple falls with subdural bleeds requiring craniotomy, diabetes mellitus type 2, bipolar disorder, history of migraines, history of hypertension, hyperlipidemia, syncope, history of tachybradycardia syndrome status post pacemaker placement, as well as other comorbidities who lives in a nursing home and presented with shortness of breath and was admitted with a diagnosis of acute respiratory failure with hypoxia in the setting of acute on chronic systolic and diastolic CHF.  Cardiology was consulted and she was initiated on IV diuresis and responded well.  Patient continues to have some dyspnea and cardiology feels that since she is only improved slightly with diuresis and controlling heart rate there planning for right and left heart catheterization and this will be done on Friday 07/05/21.    Cardiology has initiated a heparin drip and she was given Vitamin K and Coumadin held INR dropped to 1.3.  PT OT recommending SNF.  Will need to monitor her symptoms as she was dizzy  and nauseous but is now improving.   She underwent Cath and it showed Widely patent coronary arteries with no significant stenoses, right dominant coronary circulation but did also show Elevated right heart pressures and large V waves in the pulmonary wedge tracing of 38 mmHg. Additionally there was mild pulmonary hypertension with mean pulmonary artery pressure 33 mmHg, suspect secondary to left heart disease (transpulmonary gradient only 8 mmHg) and the recommendation was for Diuresis and medical therapy for CHF.      Assessment and Plan: * Acute hypoxemic respiratory failure ([place])- (present on admission) - She had acute hypoxic respiratory failure in setting of acute on chronic systolic and diastolic CHF with reduced EF from a few years ago -She missed some doses of Lasix at SNF due to some miscommunication but she denies any chest pain and did not have any acute EKG changes -She is given IV Lasix with approximately 3-1/2-4 point liters of diuresis and her shortness of breath is much improved. -She had to be placed on BiPAP initially but is now weaned to room air -She was on Lasix per cardiology recommendations but this has been discontinued given that she is not volume overloaded on exam -SpO2: 97 % O2 Flow Rate (L/min): 3 L/min FiO2 (%): 40 %; no longer wearing supplemental oxygen via nasal cannula and has been off oxygen for 3days -Dr. Candiss Norse discussed with the cardiologist yesterday and they increased her Coreg and ARB -She will need to follow-up with Dr. Tamala Julian her primary cardiologist at discharge but cardiology is planning a cardiac catheterization this hospitalization and now done and recommending continued diuresis -Continuous pulse oximetry maintain O2 saturation greater than 90% -Patient will need an ambulatory home O2 screen prior to discharge and a repeat chest x-ray  Obesity (BMI 89-21.1) -Complicates overall prognosis and care -Estimated body mass index is 31.41 kg/m as calculated from the following:   Height as of this encounter: 5\' 8"  (1.727 m).   Weight as of this encounter: 93.7 kg.  -Weight Loss and Dietary Counseling given   Anemia of chronic kidney failure, stage 3 (moderate) (HCC) -Patient's Hb/Hct went from  11.7/36.4 -> 10.7/34.2 -> 10.0/32.0 -> 9.9/32.0 -> 10.9/32.0 -Check anemia panel in the a.m. -Continue to monitor for signs and symptoms of bleeding as she is anticoagulated and being Heparin drip and Coumadin; no overt bleeding noted -Repeat CBC in the a.m.  Stage 3a  chronic kidney disease (CKD) (HCC) -Patient's baseline creatinine is between 1.2 and 1.4 -BUN/creatinine went from 40/1.52 -> 33/1.32 -> 32/1.30 -> 32/1.36 -> 34/1.45 -Continue monitor renal function as she is now had the addition of Cozaar as well as getting a Cardiac Cath -Avoid nephrotoxic medications, contrast dyes, hypotension and renally dose medications if possible  -Repeat CMP in a.m.  History of CVA (cerebrovascular accident) -Has had embolic strokes due to A-fib with multiple falls and subdural hematoma and bleeds -Continue supportive care and PT OT feel that she is a high fall risk -She will be discharged back to SNF once medically stable and cleared by cardiology; Cardiology recommending resuming AC with Heparin 8 hours after Cath and continuing Coumadin   S/P AVR -Had a repeat echocardiogram done which showed a post AVR with a 25 mm Saint Jude trivial closing volume central AR low-grade and normal function -Her aortic valve regurgitation is trivial and her aortic stenosis is present -Cardiology evaluated and felt that this was reassuring for her -Appreciate Card's recc's; Resuming Coumadin and will need to make sure it's therapeutic given AVR  Hypothyroidism- (present on admission) -C/w Levothyroxine 25 mcg p.o. daily  Bipolar disorder ([place])- (present on admission) -C/w Lamotrigine 100 mg p.o. daily and 200 mg p.o. nightly  Atrial fibrillation and flutter ([place])- (present on admission) -HR is improving and she had an ECHOCardiogram -Her Coumadin was being held and she is being started on a heparin drip given INR below her goal; INR is now 1.3 and Cardiology cleared patient for a cath and results as above; cardiology recommending resuming anticoagulation with a heparin drip 8 hours after the cath and also continue Coumadin -Continue to monitor on telemetry -Cardiology is evaluating and her A-fib is in the 70s up to the 110s 120s with Ambulation  -continue with carvedilol 12.5  mg p.o. twice daily -Further care per Cardiology  GERD without esophagitis- (present on admission) -Continue with pantoprazole 40 mg p.o. daily  Essential hypertension- (present on admission) -Continue with Carvedilol 12.5 mils p.o. twice daily as well as 25 mg of losartan p.o. daily -Continue with hydralazine 10 mg IV every 6 as needed for systolic blood pressure greater than 150 -Continue to monitor blood pressures per protocol -Last blood pressure reading was 119/65  Acute on chronic combined systolic and diastolic CHF (congestive heart failure) ([place])- (present on admission) -Repeat echocardiogram done here showed an LVEF of 25 to 30% with severely decreased function in the left ventricle demonstrates global hypokinesis and left ventricular internal cavity size is moderately dilated with the ventricular diastolic parameters being indeterminate -She had 3 weeks of progressively worsening dyspnea with edema -Had elevated JVP on arrival and initially required BiPAP -BNP was elevated at 854.4 on admission and trended down to 273.4 -Currently getting diuresis and has improving slightly -INR is 2.8 and cardiology is planning for right and left heart catheterization Friday and recommending stopping Coumadin, given vitamin K 2.5 mg p.o. today and starting a heparin drip -Continue with albuterol 3 mL IH every 6 as needed for wheezing or shortness of breath -Appreciate cardiology assistance and recommendations -Her diuresis had been held but after cath cardiology recommends continuing diuresis and medical therapy for CHF; Continue Diuresis per  Cardiology  -Continue strict I's and O's and daily weights; patient's I's and O's were inaccurate yesterday but have been corrected and now she is -2.693 L since admission -Weight is down from 213 pounds down to 206.57 yesterday and today is 207 -Continue to monitor for signs and symptoms of volume overload and repeat chest x-ray this a.m. showed "No evidence  of acute chest disease. Cardiomegaly with aortic valve prosthesis and pacemaker."     Vitamin B 12 deficiency- (present on admission) -Continue with vitamin B12 1000 mcg p.o. daily  Recurrent falls -PT/OT recommending returning to SNF with PT/OT  Hyperlipidemia associated with type 2 diabetes mellitus ([place])- (present on admission) -Continue with Rosuvastatin 40 mg p.o. daily as well as Fenofibrate 160 mg p.o. daily  Atrial fibrillation ([place])- (present on admission) -Has proximal atrial fibrillation with a CHA2DS2-VASc of greater than 5 and history of embolic strokes -She was getting anticoagulation with Coumadin which had been held and switched to heparin drip in anticipation for cardiac cath; not have a cath is done we will resume her anticoagulation -Her last INR went from 2.8 -> 2.0 -> 1.3 -Cardiology increased her Coreg dosing-on 07/02/2021 -Continue to monitor on telemetry and appreciate further evaluation recommendation by cardiology   Subjective: Seen and examined at bedside and she is doing much better today.  Was not nauseous for vomiting.  Denied chest pain but had an episode of brief dyspnea on ambulation this morning.  Heart rate is better controlled.  No other concerns at this time and ready for her cardiac catheterization.  Physical Exam: Vitals:   07/05/21 1125 07/05/21 1130 07/05/21 1410 07/05/21 1520  BP: 125/84 129/78 119/65 118/75  Pulse: 91 76 67 65  Resp: (!) 21 (!) 23 20 15   Temp:   98.1 F (36.7 C)   TempSrc:   Oral   SpO2: 95% 97% 97% 96%  Weight:      Height:       Examination: Physical Exam:  Constitutional: WN/WD obese Caucasian female in NAD and appears calm and comfortable Respiratory: Diminished to auscultation bilaterally with coarse breath sounds, no wheezing, rales, rhonchi or crackles. Normal respiratory effort and patient is not tachypenic. No accessory muscle use. Unlabored breathing  Cardiovascular: Irregularly Irregular, no murmurs / rubs /  gallops. S1 and S2 auscultated. Mild LE edema Abdomen: Soft, non-tender, Distended 2/2 body habitus. No masses palpated. No appreciable hepatosplenomegaly. Bowel sounds positive.  GU: Deferred.  Data Reviewed:  Dependently interpreted and reviewed the patient's CBC, CMP and reviewed her chest x-ray  Hemoglobin/marker is relatively stable compared to yesterday and her renal function is slightly worse with a BUN/creatinine of 34/1.45  Family Communication: Discussed with the daughter Amy at bedside  Disposition: Status is: Inpatient Remains inpatient appropriate because: Patient is undergoing a cardiac catheterization and now cardiology recommends continuing diuresis  Planned Discharge Destination: Skilled nursing facility  DVT Prophylaxis: Anticoagulated with Heparin gtt and Warfarin   Author: Raiford Noble, DO [facility] 07/05/2021 4:34 PM  For on call review [email or web address].

## 2021-07-06 DIAGNOSIS — J9601 Acute respiratory failure with hypoxia: Secondary | ICD-10-CM | POA: Diagnosis not present

## 2021-07-06 DIAGNOSIS — I5043 Acute on chronic combined systolic (congestive) and diastolic (congestive) heart failure: Secondary | ICD-10-CM | POA: Diagnosis not present

## 2021-07-06 DIAGNOSIS — I4891 Unspecified atrial fibrillation: Secondary | ICD-10-CM | POA: Diagnosis not present

## 2021-07-06 DIAGNOSIS — I10 Essential (primary) hypertension: Secondary | ICD-10-CM | POA: Diagnosis not present

## 2021-07-06 LAB — COMPREHENSIVE METABOLIC PANEL
ALT: 12 U/L (ref 0–44)
AST: 18 U/L (ref 15–41)
Albumin: 3.8 g/dL (ref 3.5–5.0)
Alkaline Phosphatase: 29 U/L — ABNORMAL LOW (ref 38–126)
Anion gap: 10 (ref 5–15)
BUN: 31 mg/dL — ABNORMAL HIGH (ref 8–23)
CO2: 25 mmol/L (ref 22–32)
Calcium: 9.5 mg/dL (ref 8.9–10.3)
Chloride: 104 mmol/L (ref 98–111)
Creatinine, Ser: 1.53 mg/dL — ABNORMAL HIGH (ref 0.44–1.00)
GFR, Estimated: 36 mL/min — ABNORMAL LOW (ref >60)
Glucose, Bld: 166 mg/dL — ABNORMAL HIGH (ref 70–99)
Potassium: 3.9 mmol/L (ref 3.5–5.1)
Sodium: 139 mmol/L (ref 135–145)
Total Bilirubin: 0.6 mg/dL (ref 0.3–1.2)
Total Protein: 6.8 g/dL (ref 6.5–8.1)

## 2021-07-06 LAB — CBC WITH DIFFERENTIAL/PLATELET
Abs Immature Granulocytes: 0.03 10*3/uL (ref 0.00–0.07)
Basophils Absolute: 0.1 10*3/uL (ref 0.0–0.1)
Basophils Relative: 2 %
Eosinophils Absolute: 0.2 10*3/uL (ref 0.0–0.5)
Eosinophils Relative: 2 %
HCT: 31.7 % — ABNORMAL LOW (ref 36.0–46.0)
Hemoglobin: 10 g/dL — ABNORMAL LOW (ref 12.0–15.0)
Immature Granulocytes: 0 %
Lymphocytes Relative: 24 %
Lymphs Abs: 1.8 10*3/uL (ref 0.7–4.0)
MCH: 29.5 pg (ref 26.0–34.0)
MCHC: 31.5 g/dL (ref 30.0–36.0)
MCV: 93.5 fL (ref 80.0–100.0)
Monocytes Absolute: 0.5 10*3/uL (ref 0.1–1.0)
Monocytes Relative: 7 %
Neutro Abs: 4.9 10*3/uL (ref 1.7–7.7)
Neutrophils Relative %: 65 %
Platelets: 258 10*3/uL (ref 150–400)
RBC: 3.39 MIL/uL — ABNORMAL LOW (ref 3.87–5.11)
RDW: 14.5 % (ref 11.5–15.5)
WBC: 7.6 10*3/uL (ref 4.0–10.5)
nRBC: 0 % (ref 0.0–0.2)

## 2021-07-06 LAB — HEPARIN LEVEL (UNFRACTIONATED)
Heparin Unfractionated: 0.24 IU/mL — ABNORMAL LOW (ref 0.30–0.70)
Heparin Unfractionated: 0.47 IU/mL (ref 0.30–0.70)
Heparin Unfractionated: 0.53 IU/mL (ref 0.30–0.70)

## 2021-07-06 LAB — PROTIME-INR
INR: 1.2 (ref 0.8–1.2)
Prothrombin Time: 14.8 seconds (ref 11.4–15.2)

## 2021-07-06 LAB — MAGNESIUM: Magnesium: 1.8 mg/dL (ref 1.7–2.4)

## 2021-07-06 LAB — PHOSPHORUS: Phosphorus: 3.5 mg/dL (ref 2.5–4.6)

## 2021-07-06 MED ORDER — SACUBITRIL-VALSARTAN 24-26 MG PO TABS
1.0000 | ORAL_TABLET | Freq: Two times a day (BID) | ORAL | Status: DC
  Administered 2021-07-06 – 2021-07-07 (×2): 1 via ORAL
  Filled 2021-07-06 (×2): qty 1

## 2021-07-06 MED ORDER — SACUBITRIL-VALSARTAN 24-26 MG PO TABS
1.0000 | ORAL_TABLET | Freq: Two times a day (BID) | ORAL | Status: DC
Start: 2021-07-06 — End: 2021-07-06

## 2021-07-06 MED ORDER — LIP MEDEX EX OINT
TOPICAL_OINTMENT | CUTANEOUS | Status: DC | PRN
  Filled 2021-07-06: qty 7

## 2021-07-06 MED ORDER — DAPAGLIFLOZIN PROPANEDIOL 10 MG PO TABS
10.0000 mg | ORAL_TABLET | Freq: Every day | ORAL | Status: DC
  Administered 2021-07-06 – 2021-07-07 (×2): 10 mg via ORAL
  Filled 2021-07-06 (×2): qty 1

## 2021-07-06 MED ORDER — WARFARIN SODIUM 5 MG PO TABS
5.0000 mg | ORAL_TABLET | Freq: Once | ORAL | Status: AC
  Administered 2021-07-06: 5 mg via ORAL
  Filled 2021-07-06: qty 1

## 2021-07-06 MED ORDER — MAGNESIUM SULFATE 2 GM/50ML IV SOLN
2.0000 g | Freq: Once | INTRAVENOUS | Status: AC
  Administered 2021-07-06: 2 g via INTRAVENOUS
  Filled 2021-07-06: qty 50

## 2021-07-06 MED ORDER — TRAZODONE HCL 100 MG PO TABS
100.0000 mg | ORAL_TABLET | Freq: Every evening | ORAL | Status: AC | PRN
  Administered 2021-07-06: 100 mg via ORAL
  Filled 2021-07-06: qty 1

## 2021-07-06 NOTE — Progress Notes (Addendum)
Progress Note  Patient Name: [name] Date of Encounter: 07/06/2021  [facility] [place] [facility] Cardiologist: Sinclair Grooms, MD  EP: Dr. Lovena Le  Subjective   Cath yesterday showed widely patent coronary arteries with no significant stenosis stenosis.  She did have a large V waves on pulmonary wedge tracing of 38 mmHg with mild pulmonary hypertension with mean pulmonary artery pressure 33 mmHg  Shortness of breath is improved and she is actually feeling very good today  Inpatient Medications    Scheduled Meds:  carvedilol  12.5 mg Oral BID WC   cholecalciferol  1,000 Units Oral Daily   docusate sodium  100 mg Oral BID   fenofibrate  160 mg Oral Daily   furosemide  40 mg Oral Daily   lamoTRIgine  100 mg Oral Daily   lamoTRIgine  200 mg Oral QHS   levothyroxine  25 mcg Oral QAC breakfast   losartan  25 mg Oral Daily   pantoprazole  40 mg Oral Daily   potassium chloride  20 mEq Oral Daily   rosuvastatin  40 mg Oral Daily   sodium chloride flush  3 mL Intravenous Q12H   sodium chloride flush  3 mL Intravenous Q12H   cyanocobalamin  1,000 mcg Oral Daily   Warfarin - Pharmacist Dosing Inpatient   Does not apply q1600   Continuous Infusions:  sodium chloride     heparin 1,350 Units/hr (07/06/21 0619)   PRN Meds: sodium chloride, acetaminophen **OR** acetaminophen, albuterol, bisacodyl, hydrALAZINE, lip balm, melatonin, ondansetron **OR** ondansetron (ZOFRAN) IV, prochlorperazine, sodium chloride flush, SUMAtriptan   Vital Signs    Vitals:   07/05/21 1520 07/05/21 1953 07/06/21 0435 07/06/21 0846  BP: 118/75 122/61 114/65 121/61  Pulse: 65 76 74 74  Resp: 15 20 17    Temp:  97.8 F (36.6 C) 98 F (36.7 C)   TempSrc:  Oral Oral   SpO2: 96% 98% 96%   Weight:   94.4 kg   Height:        Intake/Output Summary (Last 24 hours) at 07/06/2021 0929 Last data filed at 07/06/2021 0854 Gross per 24 hour  Intake 292.75 ml  Output 200 ml  Net 92.75 ml    Last 3 Weights 07/06/2021  07/05/2021 07/04/2021  Weight (lbs) 208 lb 1.6 oz 207 lb 3.7 oz 208 lb 15.9 oz  Weight (kg) 94.394 kg 94 kg 94.8 kg      Telemetry    Normal sinus rhythm-personally reviewed by me   ECG    N/a  Physical Exam   Physical Exam: Blood pressure 121/61, pulse 74, temperature 98 F (36.7 C), temperature source Oral, resp. rate 17, height 5\' 8"  (1.727 m), weight 94.4 kg, SpO2 96 %.  GEN: Well nourished, well developed in no acute distress HEENT: Normal NECK: No JVD; No carotid bruits LYMPHATICS: No lymphadenopathy CARDIAC:RRR, no murmurs, rubs, gallops RESPIRATORY:  Clear to auscultation without rales, wheezing or rhonchi  ABDOMEN: Soft, non-tender, non-distended MUSCULOSKELETAL:  No edema; No deformity  SKIN: Warm and dry NEUROLOGIC:  Alert and oriented x 3 PSYCHIATRIC:  Normal affect    Labs    High Sensitivity Troponin:   Recent Labs  Lab 06/27/21 1443  TROPONINIHS 54*      Chemistry Recent Labs  Lab 07/04/21 0417 07/05/21 0216 07/05/21 1108 07/05/21 1109 07/06/21 0250  NA 141 140 143 142 139  K 4.4 3.9 4.0 4.1 3.9  CL 104 105  --   --  104  CO2 29 27  --   --  25  GLUCOSE 136* 138*  --   --  166*  BUN 32* 34*  --   --  31*  CREATININE 1.36* 1.45*  --   --  1.53*  CALCIUM 9.5 9.3  --   --  9.5  MG 2.0 1.8  --   --  1.8  PROT 6.3* 6.5  --   --  6.8  ALBUMIN 3.5 3.5  --   --  3.8  AST 16 18  --   --  18  ALT 11 11  --   --  12  ALKPHOS 25* 29*  --   --  29*  BILITOT 0.5 0.6  --   --  0.6  GFRNONAA 42* 39*  --   --  36*  ANIONGAP 8 8  --   --  10     Hematology Recent Labs  Lab 07/04/21 0417 07/05/21 0216 07/05/21 1108 07/05/21 1109 07/06/21 0250  WBC 5.8 5.2  --   --  7.6  RBC 3.35* 3.37*  --   --  3.39*  HGB 10.0* 9.9* 10.9* 10.9* 10.0*  HCT 32.0* 32.0* 32.0* 32.0* 31.7*  MCV 95.5 95.0  --   --  93.5  MCH 29.9 29.4  --   --  29.5  MCHC 31.3 30.9  --   --  31.5  RDW 14.8 14.6  --   --  14.5  PLT 250 248  --   --  258    Thyroid  No results  for input(s): TSH, FREET4 in the last 168 hours.   BNP Recent Labs  Lab 06/30/21 0248 07/01/21 0645  BNP 263.0* 273.4*     DDimer No results for input(s): DDIMER in the last 168 hours.   Radiology    CARDIAC CATHETERIZATION  Result Date: 07/05/2021 1.  Widely patent coronary arteries with no significant stenoses, right dominant coronary circulation 2.  Elevated right heart pressures and large V waves in the pulmonary wedge tracing of 38 mmHg 3.  Mild pulmonary hypertension with mean pulmonary artery pressure 33 mmHg, suspect secondary to left heart disease (transpulmonary gradient only 8 mmHg) Recommend: Diuresis, medical therapy for CHF.  DG CHEST PORT 1 VIEW  Result Date: 07/05/2021 CLINICAL DATA:  Dyspnea. EXAM: PORTABLE CHEST 1 VIEW COMPARISON:  Portable chest 06/27/2021. FINDINGS: The heart is enlarged. There is an aortic valve prosthesis, old median sternotomy sutures and dual lead left chest pacing system with stable wires. There are overlying monitor wires on the right. The lungs show mild chronic changes but are clear of infiltrates. No pleural collection is seen. Osteopenia. Right axillary surgical clips. Interval resolution of the prior CHF findings and pleural effusions. Slight elevation right diaphragm.  Osteopenia. IMPRESSION: No evidence of acute chest disease. Cardiomegaly with aortic valve prosthesis and pacemaker. Electronically Signed   By: Telford Nab M.D.   On: 07/05/2021 07:04    Cardiac Studies   Echo  1. EF has decreased since echo done 2021 . Left ventricular ejection  fraction, by estimation, is 25 to 30%. The left ventricle has severely  decreased function. The left ventricle demonstrates global hypokinesis.  The left ventricular internal cavity size   was moderately dilated. Left ventricular diastolic parameters are  indeterminate.   2. Pacing wires in RV/RA. Right ventricular systolic function is normal.  The right ventricular size is normal. There is  mildly elevated pulmonary  artery systolic pressure.   3. Left atrial size was moderately dilated.   4.  The mitral valve is degenerative. Mild mitral valve regurgitation. No  evidence of mitral stenosis. Moderate mitral annular calcification.   5. Tricuspid valve regurgitation is mild to moderate.   6. Post AVR with 25 mm St Jude trivial closing volume central AR low  gradients normal function . The aortic valve has been repaired/replaced.  Aortic valve regurgitation is trivial. No aortic stenosis is present.  There is a 25 mm St. Jude mechanical  valve present in the aortic position.   7. The inferior vena cava is normal in size with greater than 50%  respiratory variability, suggesting right atrial pressure of 3 mmHg.   Cardiac cath 07/05/2021 Conclusion  1.  Widely patent coronary arteries with no significant stenoses, right dominant coronary circulation 2.  Elevated right heart pressures and large V waves in the pulmonary wedge tracing of 38 mmHg 3.  Mild pulmonary hypertension with mean pulmonary artery pressure 33 mmHg, suspect secondary to left heart disease (transpulmonary gradient only 8 mmHg)   Recommend: Diuresis, medical therapy for CHF. Patient Profile     71 y.o. female with a hx of bicuspid AV/AS with ascending aortic aneurysm s/p resection/grafting of ascending aortic aneurysm and placement of St. Jude mechanical AV conduit/Bentall 2009, atrial fibrillation/flutter, bradycardia s/p Bos Sci PPM implantation 12/2019, multiple falls with prior craniotomy for subdural hematoma/subarachnoid hemorrhage, prior chronic infarcts on MRI 2021, bipolar disorder, chronic diastolic CHF (prior LVEF 68-08% in 2021, improved to 50-55% in 03/2020), anemia, depression, debilitation (wheelchair bound), DM, HLD, HTN, mild carotid artery disease 2021, back pain, obesity who is being seen  for the evaluation of CHF at the request of Benedetto Goad PA-C.  Assessment & Plan    Acute on chronic combined  diastolic/systolic CHF - 3 weeks hx of progressive worsening dyspnea with edema. Elevated JVD on arrival. Initially required BiPAP in the ED. BNP 854 >> 743.  -Cardiac cath showed widely patent coronary arteries with no obstructive disease and only minimal luminal irregularities in the left main -Cath also revealed large V waves on pulmonary registration of 38 mmHg with mild pulmonary hypertension mean PA pressure 33 mmHg. -2D echo on 06/28/2021 showed EF 25 to 30% with global hypokinesis with mild to moderate TR and mild MR -She put out 200 cc yesterday and unlikely to be accurate she is net negative at least 2.4 L since admission -Weight down 5 pounds from admission -Serum creatinine slightly bumped from 1.45-1.53 today.  Potassium 3.9 -She needs to be on goal-directed medical therapy for heart failure -Continue carvedilol 12.5 mg twice daily and Lasix 40 mg daily -Will change losartan to Entresto 24-26 mg twice daily -Follow renal function closely post cath and while adding on Entresto -Add Farxiga 10 mg daily -Consider addition of spironolactone as outpatient if renal function remains stable -Check bmet in 1 week -She is also going to be establishing advanced heart failure clinic -I have discussed the plan with her daughter who is a neurosurgical nurse at [employer]  2. Atrial fibrillation  -Status post [employer] permanent pacemaker implant in 2021 for bradycardia -INR is 1. this morning.  She is on IV heparin.   -Restart Coumadin per pharmacy -I have placed a consult with case management for Lovenox injections for bridging while reloading Coumadin so she can be discharged from the hospital  3.  Bicuspid aortic valve with aortic stenosis/ascending aortic aneurysm -s/p resection and grafting of a sending aortic aneurysm and placement of 23 mm St Jude mechanical AVR conduit/Bentall in 2009 -Echo  this admission showed a stable 23 mm Saint Jude mechanical aortic valve replacement with  trivial AI and mean aortic valve gradient 6 mmHg -Continue IV heparin while reloading Coumadin  4.  Chronic kidney disease stage III -Per TRH   5.  Hypertension -BP is adequately controlled -Continue carvedilol 12.5 mg twice daily -Stopping losartan and adding Entresto 24-26 mg twice daily for CHF  I have spent a total of 40 minutes with patient reviewing cardiac cath, 2D echo, prior notes from office, telemetry, EKGs, labs and examining patient as well as establishing an assessment and plan that was discussed with the patient.  > 50% of time was spent in direct patient care.      For questions or updates, please contact [place] Please consult [email or web address] for contact info under      Fransico Him, MD  07/06/2021 9:29 AM    [address],  [place], [place]  [address] Phone: [phone]; Fax: [phone]

## 2021-07-06 NOTE — Progress Notes (Signed)
Progress Note   Patient: [name] DOB: 02-18-1951 DOA: 06/27/2021     9 DOS: the patient was seen and examined on 07/06/2021   Brief hospital course: The patient is a 71 year old obese Caucasian female with a past medical history significant for breast admitted to [facility] aortic valve that was placed in 2011, PAF with CHA2DS2-VASc score of greater than 5 and history of embolic strokes and history of multiple falls with subdural bleeds requiring craniotomy, diabetes mellitus type 2, bipolar disorder, history of migraines, history of hypertension, hyperlipidemia, syncope, history of tachybradycardia syndrome status post pacemaker placement, as well as other comorbidities who lives in a nursing home and presented with shortness of breath and was admitted with a diagnosis of acute respiratory failure with hypoxia in the setting of acute on chronic systolic and diastolic CHF.  Cardiology was consulted and she was initiated on IV diuresis and responded well.  Patient continues to have some dyspnea and cardiology feels that since she is only improved slightly with diuresis and controlling heart rate there planning for right and left heart catheterization and this will be done on Friday 07/05/21.    Cardiology has initiated a heparin drip and she was given Vitamin K and Coumadin held INR dropped to 1.3.  PT OT recommending SNF.  Will need to monitor her symptoms as she was dizzy  and nauseous but is now improving.   She underwent Cath and it showed Widely patent coronary arteries with no significant stenoses, right dominant coronary circulation but did also show Elevated right heart pressures and large V waves in the pulmonary wedge tracing of 38 mmHg. Additionally there was mild pulmonary hypertension with mean pulmonary artery pressure 33 mmHg, suspect secondary to left heart disease (transpulmonary gradient only 8 mmHg) and the recommendation was for Diuresis and medical therapy for CHF.      Assessment and Plan: * Acute hypoxemic respiratory failure (HCC)- (present on admission) - She had acute hypoxic respiratory failure in setting of acute on chronic systolic and diastolic CHF with reduced EF from a few years ago -She missed some doses of Lasix at SNF due to some miscommunication but she denies any chest pain and did not have any acute EKG changes -IV Lasix has now stopped  -She had to be placed on BiPAP initially but is now weaned to room air -She was on Lasix per cardiology recommendations but this has been discontinued given that she is not volume overloaded on exam -SpO2: 97 % O2 Flow Rate (L/min): 3 L/min FiO2 (%): 40 %; no longer wearing supplemental oxygen via nasal cannula and has been off oxygen for 4 days -Dr. Candiss Norse discussed with the cardiologist yesterday and they increased her Coreg and ARB but now her ARB has been changed to [facility] -She will need to follow-up with Dr. Tamala Julian her primary cardiologist at discharge but cardiology is planning a cardiac catheterization this hospitalization and now done and recommending continued diuresis -Continuous pulse oximetry maintain O2 saturation greater than 90% -Patient will need an ambulatory home O2 screen prior to discharge and a repeat chest x-ray  Obesity (BMI 82-42.3) -Complicates overall prognosis and care -Estimated body mass index is 31.41 kg/m as calculated from the following:   Height as of this encounter: 5\' 8"  (1.727 m).   Weight as of this encounter: 93.7 kg.  -Weight Loss and Dietary Counseling given   Anemia of chronic kidney failure, stage 3 (moderate) (HCC) -Patient's Hb/Hct went from 11.7/36.4 -> 10.7/34.2 ->  10.0/32.0 -> 9.9/32.0 -> 10.9/32.0 -> 10.0/31.7 -Check anemia panel in the a.m. -Continue to monitor for signs and symptoms of bleeding as she is anticoagulated and being Heparin drip and Coumadin; no overt bleeding noted -Repeat CBC in the a.m.  Stage 3a chronic kidney disease (CKD)  (HCC) -Patient's baseline creatinine is between 1.2 and 1.4 -BUN/creatinine went from 40/1.52 -> 33/1.32 -> 32/1.30 -> 32/1.36 -> 34/1.45 today was 31/1.53 -Continue monitor renal function as she is now had the addition of Cozaar as well as getting a Cardiac Cath -Cozaar has not been discontinued and cardiology initiating Entresto 24-26 mg p.o. twice daily and adding Farxiga 10 mg p.o. daily -Avoid nephrotoxic medications, contrast dyes, hypotension and renally dose medications if possible  -Repeat CMP in a.m.  History of CVA (cerebrovascular accident) -Has had embolic strokes due to A-fib with multiple falls and subdural hematoma and bleeds -Continue supportive care and PT OT feel that she is a high fall risk -She will be discharged back to SNF once medically stable and cleared by cardiology; Cardiology recommending resuming AC with Heparin 8 hours after Cath and continuing Coumadin so that it becomes therapeutic.  If she gets discharged prior to this we can discharge her with Coumadin and Lovenox injections   S/P AVR -Had a repeat echocardiogram done which showed a post AVR with a 25 mm Saint Jude trivial closing volume central AR low-grade and normal function -Her aortic valve regurgitation is trivial and her aortic stenosis is present -Cardiology evaluated and felt that this was reassuring for her -Appreciate Card's recc's; Resuming Coumadin and will need to make sure it's therapeutic given AVR and so she is now back on a heparin drip while reloading her Coumadin -Cardiology feels that she can be discharged on Lovenox injections and addition of Coumadin if she gets discharged -Patient is status post resection and grafting of ascending aortic aneurysm and placement of a 23 mm Saint Jude mechanical AVR conduit/Bentall in 2009 -Appreciate cardiology's recommendations and continue monitor INR daily as it was 1.2 today   Hypothyroidism- (present on admission) -C/w Levothyroxine 25 mcg p.o.  daily  Bipolar disorder ([place])- (present on admission) -C/w Lamotrigine 100 mg p.o. daily and 200 mg p.o. nightly  Atrial fibrillation and flutter ([place])- (present on admission) -HR is improving and she had an ECHOCardiogram -Her Coumadin was being held and she is being started on a heparin drip given INR below her goal; INR is now 1.2 and Cardiology cleared patient for a cath and results as above; cardiology recommending resuming anticoagulation with heparin drip as well as Coumadin and if she gets discharged can likely be discharged on Lovenox injections and Coumadin -Continue to monitor on telemetry -Further care per Cardiology  GERD without esophagitis- (present on admission) -Continue with pantoprazole 40 mg p.o. daily  Essential hypertension- (present on admission) -Continue with Carvedilol 12.5 milligrams p.o. twice daily  -Cardiology stopping her losartan and changing to p.o. Entresto 24-26 mg p.o. twice daily -Continue with hydralazine 10 mg IV every 6 as needed for systolic blood pressure greater than 150 -Continue to monitor blood pressures per protocol -Last blood pressure reading was 102/63  Acute on chronic combined systolic and diastolic CHF (congestive heart failure) (HCC)- (present on admission) -Repeat echocardiogram done here showed an LVEF of 25 to 30% with severely decreased function in the left ventricle demonstrates global hypokinesis and left ventricular internal cavity size is moderately dilated with the ventricular diastolic parameters being indeterminate -She had 3 weeks of progressively worsening  dyspnea with edema -Had elevated JVP on arrival and initially required BiPAP -BNP was elevated at 854.4 on admission and trended down to 273.4 -Currently getting diuresis and has improving slightly -INR is 2.8 and cardiology is planning for right and left heart catheterization Friday and recommending stopping Coumadin, given vitamin K 2.5 mg p.o. today and starting a  heparin drip -Continue with albuterol 3 mL IH every 6 as needed for wheezing or shortness of breath -Appreciate cardiology assistance and recommendations -Her diuresis had been held but after cath cardiology recommends continuing diuresis and medical therapy for CHF; Continue Diuresis per Cardiology  -Continue strict I's and O's and daily weights; patient's I's and O's were inaccurate yesterday but have been corrected and now she is -2.693 L since admission -Weight is down from 213 pounds down to 206.57 yesterday and today is 207 -Continue to monitor for signs and symptoms of volume overload and repeat chest x-ray yesterday a.m. showed "No evidence of acute chest disease. Cardiomegaly with aortic valve prosthesis and pacemaker."     Vitamin B 12 deficiency- (present on admission) -Continue with vitamin B12 1000 mcg p.o. daily  Pacemaker- (present on admission) - She is status post Scientific laboratory technician pacemaker implant 2021 for bradycardia  Recurrent falls -PT/OT recommending returning to SNF with PT/OT and will discharge once stable from a cardiac perspective  Hyperlipidemia associated with type 2 diabetes mellitus ([place])- (present on admission) -Continue with Rosuvastatin 40 mg p.o. daily as well as Fenofibrate 160 mg p.o. daily  Atrial fibrillation ([place])- (present on admission) -Has proximal atrial fibrillation with a CHA2DS2-VASc of greater than 5 and history of embolic strokes -She was getting anticoagulation with Coumadin which had been held and switched to heparin drip in anticipation for cardiac cath; not have a cath is done we will resume her anticoagulation -Her last INR went from 2.8 -> 2.0 -> 1.3 -> 1.2 -Cardiology increased her Coreg dosing-on 07/02/2021 -Continue to monitor on telemetry and appreciate further evaluation recommendation by cardiology   Subjective: Seen And examined at bedside and Feeling very well today.  Denies any chest pain or shortness of breath.   Ambulating without issues.  No nausea or vomiting.  Denies any other concerns or complaints at this time  Physical Exam: Vitals:   07/06/21 0435 07/06/21 0846 07/06/21 1423 07/06/21 1612  BP: 114/65 121/61 133/66 102/63  Pulse: 74 74 95   Resp: 17  18 18   Temp: 98 F (36.7 C)  (!) 96.6 F (35.9 C)   TempSrc: Oral  Axillary   SpO2: 96%     Weight: 94.4 kg     Height:       Examination: Physical Exam:  Constitutional: WN/WD obese Caucasian female in NAD and appears calm and comfortable Respiratory: Diminished to auscultation bilaterally, no wheezing, rales, rhonchi or crackles. Normal respiratory effort and patient is not tachypenic. No accessory muscle use. Unlabored breathing and supplemental oxygen nasal cannula Cardiovascular: RRR, is a 2 out of 6 systolic murmur no slight click. Trace edema Abdomen: Soft, non-tender, Distended 2/2 to body habitus. Bowel sounds positive.  GU: Deferred.  Data Reviewed:  I have independently reviewed and interpreted the patient's laboratory data including her CMP as well as her CBC  Patient is CBC was reviewed and showed that she has a slight drop in her hemoglobin/hematocrit from yesterday to now 10.0/31.7 and her BUN/creatinine slightly trended up 32/1.30 and is now 31/1.53 over the last few days  Family Communication: No family at bedside  Disposition: Status is: Inpatient Remains inpatient appropriate because: Needs Cardiac Clearance  Planned Discharge Destination: Skilled nursing facility  DVT Prophylaxis: Anticoagulated with Heparin gtt and Coumadin  Author: Raiford Noble, DO [facility] 07/06/2021 7:49 PM  For on call review [email or web address].

## 2021-07-06 NOTE — Progress Notes (Addendum)
ANTICOAGULATION CONSULT NOTE - Follow Up Consult  Pharmacy Consult for Heparin Indication: atrial fibrillation  Allergies  Allergen Reactions   Mucinex [Guaifenesin Er] Other (See Comments)    Severe headaches    Keflex [Cephalexin] Other (See Comments)    Headaches and dizziness   Keflex [Cephalexin] Other (See Comments)    Headaches and/or dizziness- "allergic," per [facility]   Mucinex [Guaifenesin Er] Other (See Comments)    Severe headaches- "allergic," per [facility]   Sulfa Antibiotics Other (See Comments)    headaches   Sulfa Antibiotics Other (See Comments)    Headaches- "allergic," per [facility]    Sulfonamide Derivatives Other (See Comments)    Headaches     Patient Measurements: Height: 5\' 8"  (172.7 cm) Weight: 94.4 kg (208 lb 1.6 oz) IBW/kg (Calculated) : 63.9 Heparin Dosing Weight: 84.9 kg  Vital Signs: Temp: 98 F (36.7 C) (02/11 0435) Temp Source: Oral (02/11 0435) BP: 121/61 (02/11 0846) Pulse Rate: 74 (02/11 0846)  Labs: Recent Labs    07/04/21 0417 07/04/21 1755 07/05/21 0216 07/05/21 1108 07/05/21 1109 07/06/21 0250 07/06/21 1127  HGB 10.0*  --  9.9* 10.9* 10.9* 10.0*  --   HCT 32.0*  --  32.0* 32.0* 32.0* 31.7*  --   PLT 250  --  248  --   --  258  --   LABPROT 22.5*  --  15.8*  --   --  14.8  --   INR 2.0*  --  1.3*  --   --  1.2  --   HEPARINUNFRC  --    < > 0.49  --   --  0.24* 0.47  CREATININE 1.36*  --  1.45*  --   --  1.53*  --    < > = values in this interval not displayed.    Estimated Creatinine Clearance: 41.1 mL/min (A) (by C-G formula based on SCr of 1.53 mg/dL (H)).   Medications:  Scheduled:   carvedilol  12.5 mg Oral BID WC   cholecalciferol  1,000 Units Oral Daily   dapagliflozin propanediol  10 mg Oral Daily   docusate sodium  100 mg Oral BID   fenofibrate  160 mg Oral Daily   furosemide  40 mg Oral Daily   lamoTRIgine  100 mg Oral Daily   lamoTRIgine  200 mg Oral QHS   levothyroxine  25 mcg Oral QAC breakfast   pantoprazole   40 mg Oral Daily   potassium chloride  20 mEq Oral Daily   rosuvastatin  40 mg Oral Daily   sacubitril-valsartan  1 tablet Oral BID   sodium chloride flush  3 mL Intravenous Q12H   sodium chloride flush  3 mL Intravenous Q12H   cyanocobalamin  1,000 mcg Oral Daily   warfarin  5 mg Oral ONCE-1600   Warfarin - Pharmacist Dosing Inpatient   Does not apply q1600   Infusions:   sodium chloride     heparin 1,350 Units/hr (07/06/21 9381)    Assessment: 71 yo F with h/o recurrent AF and mechanical AV valve replacement, on warfarin at facility PTA.  Pharmacy consulted for heparin dosing (to start when INR < 2.5).   S/p cath on 2/10, heparin restarted 8 hours post-sheath removal (sheath removed ~11:30am).    Warfarin regimen PTA: 3.5 mg daily (recently reduced from 4 mg daily), total weekly dose 24.5 mg.   INR low at 1.2 today (INR goal 2.5-3.5).  Heparin level today is therapeutic at 0.47, on 1350 units/hr.  Hgb 10, plt 258.  No line issues or signs/symptoms of bleeding noted.  Goal of Therapy:  Heparin level 0.3-0.7 units/ml INR goal 2.5-3.5 Monitor platelets by anticoagulation protocol: Yes   Plan:  Continue heparin gtt at 1350 units/hr. Warfarin 5 mg PO today.  Check ~8 hr heparin level.  Daily CBC, heparin level, INR. Monitor for signs/symptoms of bleeding.   Vance Peper, PharmD PGY1 Pharmacy Resident Phone [phone] 07/06/2021 1:03 PM   Please check AMION for all [place] phone numbers After 10:00 PM, call [place] [phone]

## 2021-07-06 NOTE — Progress Notes (Signed)
[place] for heparin  Indication: atrial fibrillation  Allergies  Allergen Reactions   Mucinex [Guaifenesin Er] Other (See Comments)    Severe headaches    Keflex [Cephalexin] Other (See Comments)    Headaches and dizziness   Keflex [Cephalexin] Other (See Comments)    Headaches and/or dizziness- "allergic," per [facility]   Mucinex [Guaifenesin Er] Other (See Comments)    Severe headaches- "allergic," per [facility]   Sulfa Antibiotics Other (See Comments)    headaches   Sulfa Antibiotics Other (See Comments)    Headaches- "allergic," per [facility]    Sulfonamide Derivatives Other (See Comments)    Headaches     Patient Measurements: Height: 5\' 8"  (172.7 cm) Weight: 94 kg (207 lb 3.7 oz) IBW/kg (Calculated) : 63.9 Heparin Dosing Weight: 84.9 kg   Vital Signs: Temp: 97.8 F (36.6 C) (02/10 1953) Temp Source: Oral (02/10 1953) BP: 122/61 (02/10 1953) Pulse Rate: 76 (02/10 1953)  Labs: Recent Labs    07/04/21 0417 07/04/21 1755 07/05/21 0216 07/05/21 1108 07/05/21 1109 07/06/21 0250  HGB 10.0*  --  9.9* 10.9* 10.9* 10.0*  HCT 32.0*  --  32.0* 32.0* 32.0* 31.7*  PLT 250  --  248  --   --  258  LABPROT 22.5*  --  15.8*  --   --  14.8  INR 2.0*  --  1.3*  --   --  1.2  HEPARINUNFRC  --  0.31 0.49  --   --  0.24*  CREATININE 1.36*  --  1.45*  --   --  1.53*     Estimated Creatinine Clearance: 41 mL/min (A) (by C-G formula based on SCr of 1.53 mg/dL (H)).   Medical History: Past Medical History:  Diagnosis Date   ANEMIA 05/28/2010   AORTIC VALVE REPLACEMENT, HX OF 03/11/2010   Mechanical prosthesis   Ascending aortic aneurysm    Atrial fibrillation (HCC)    Atrial flutter ([place])    BIPOLAR AFFECTIVE DISORDER 03/11/2010   Bradycardia    Chronic diastolic CHF (congestive heart failure) (HCC)    Depression    Diabetes ([place])    Diverticulitis 12/18/2010   Falls    FIBROIDS, UTERUS 03/11/2010   Gout 09/04/2016   Grave's disease  12/2010   Hyperlipidemia associated with type 2 diabetes mellitus ([place]) 06/05/2016   Hypertension    Low back pain 11/02/2017   Migraine 06/05/2016   Mixed hyperlipidemia 03/11/2010   Obesity    Pacemaker    Prolonged QT interval    SAH (subarachnoid hemorrhage) ([place])    Skin cancer    Subdural hematoma    Syncope 08/22/2019   UTI (lower urinary tract infection) 08/14/2011    Assessment: 43 YOF with h/o recurrent AF and h/o mechanical AV valve replacement on warfarin at facility. Pharmacy consulted to dose heparin (to start when INR < 2.5).   He is now s/p cath and heparin to restart 8 hours post sheath (removed ~ 11:30am( -INR= 1.3 (s/p 2.5mg  vitamin K on 2/8)  Warfarin regimen PTA: 3.5 mg daily (recently reduced from 4mg  daily), total weekly dose 24.5mg    2/11 AM update:  Heparin level low INR 1.2   Goal of Therapy:  Heparin level 0.3-0.7 INR goal 2-5-3.5 Monitor platelets by anticoagulation protocol: Yes   Plan:  Inc heparin to 1350 units/hr 1200 heparin level  Narda Bonds, PharmD, BCPS Clinical Pharmacist Phone: [phone]

## 2021-07-06 NOTE — Progress Notes (Signed)
ANTICOAGULATION CONSULT NOTE - Follow Up Consult  Pharmacy Consult for Heparin Indication: atrial fibrillation  Allergies  Allergen Reactions   Mucinex [Guaifenesin Er] Other (See Comments)    Severe headaches    Keflex [Cephalexin] Other (See Comments)    Headaches and dizziness   Keflex [Cephalexin] Other (See Comments)    Headaches and/or dizziness- "allergic," per [facility]   Mucinex [Guaifenesin Er] Other (See Comments)    Severe headaches- "allergic," per [facility]   Sulfa Antibiotics Other (See Comments)    headaches   Sulfa Antibiotics Other (See Comments)    Headaches- "allergic," per [facility]    Sulfonamide Derivatives Other (See Comments)    Headaches     Patient Measurements: Height: 5\' 8"  (172.7 cm) Weight: 94.4 kg (208 lb 1.6 oz) IBW/kg (Calculated) : 63.9 Heparin Dosing Weight: 84.9 kg  Vital Signs: Temp: 97.7 F (36.5 C) (02/11 2030) Temp Source: Axillary (02/11 2030) BP: 110/82 (02/11 2030) Pulse Rate: 72 (02/11 2030)  Labs: Recent Labs    07/04/21 0417 07/04/21 1755 07/05/21 0216 07/05/21 1108 07/05/21 1109 07/06/21 0250 07/06/21 1127 07/06/21 2022  HGB 10.0*  --  9.9* 10.9* 10.9* 10.0*  --   --   HCT 32.0*  --  32.0* 32.0* 32.0* 31.7*  --   --   PLT 250  --  248  --   --  258  --   --   LABPROT 22.5*  --  15.8*  --   --  14.8  --   --   INR 2.0*  --  1.3*  --   --  1.2  --   --   HEPARINUNFRC  --    < > 0.49  --   --  0.24* 0.47 0.53  CREATININE 1.36*  --  1.45*  --   --  1.53*  --   --    < > = values in this interval not displayed.     Estimated Creatinine Clearance: 41.1 mL/min (A) (by C-G formula based on SCr of 1.53 mg/dL (H)).   Medications:  Scheduled:   carvedilol  12.5 mg Oral BID WC   cholecalciferol  1,000 Units Oral Daily   dapagliflozin propanediol  10 mg Oral Daily   docusate sodium  100 mg Oral BID   fenofibrate  160 mg Oral Daily   furosemide  40 mg Oral Daily   lamoTRIgine  100 mg Oral Daily   lamoTRIgine  200 mg Oral QHS    levothyroxine  25 mcg Oral QAC breakfast   pantoprazole  40 mg Oral Daily   potassium chloride  20 mEq Oral Daily   rosuvastatin  40 mg Oral Daily   sacubitril-valsartan  1 tablet Oral BID   sodium chloride flush  3 mL Intravenous Q12H   sodium chloride flush  3 mL Intravenous Q12H   cyanocobalamin  1,000 mcg Oral Daily   Warfarin - Pharmacist Dosing Inpatient   Does not apply q1600   Infusions:   sodium chloride     heparin 1,350 Units/hr (07/06/21 3875)    Assessment: 71 yo F with h/o recurrent AF and mechanical AV valve replacement, on warfarin at facility PTA.  Pharmacy consulted for heparin dosing (to start when INR < 2.5).   S/p cath on 2/10, heparin restarted 8 hours post-sheath removal (sheath removed ~11:30am).    Warfarin regimen PTA: 3.5 mg daily (recently reduced from 4 mg daily), total weekly dose 24.5 mg.   INR low at 1.2 today (INR  goal 2.5-3.5).  Heparin level this evening is therapeutic at 0.53, on 1350 units/hr.  Hgb 10, plt 258.  No line issues or signs/symptoms of bleeding noted.  Goal of Therapy:  Heparin level 0.3-0.7 units/ml INR goal 2.5-3.5 Monitor platelets by anticoagulation protocol: Yes   Plan:  Continue heparin gtt at 1350 units/hr. Warfarin 5 mg PO today.  Daily CBC, heparin level, INR. Monitor for signs/symptoms of bleeding.   Bonnita Nasuti Pharm.D. CPP, BCPS Clinical Pharmacist [phone] 07/06/2021 9:34 PM    Please check AMION for all [place] phone numbers After 10:00 PM, call [place] [phone]

## 2021-07-06 NOTE — Assessment & Plan Note (Signed)
-   She is status post Scientific laboratory technician pacemaker implant 2021 for bradycardia

## 2021-07-07 ENCOUNTER — Other Ambulatory Visit: Payer: Self-pay | Admitting: Physician Assistant

## 2021-07-07 DIAGNOSIS — I10 Essential (primary) hypertension: Secondary | ICD-10-CM | POA: Diagnosis not present

## 2021-07-07 DIAGNOSIS — Z952 Presence of prosthetic heart valve: Secondary | ICD-10-CM

## 2021-07-07 DIAGNOSIS — N183 Chronic kidney disease, stage 3 unspecified: Secondary | ICD-10-CM | POA: Diagnosis not present

## 2021-07-07 DIAGNOSIS — J9601 Acute respiratory failure with hypoxia: Secondary | ICD-10-CM | POA: Diagnosis not present

## 2021-07-07 DIAGNOSIS — I5043 Acute on chronic combined systolic (congestive) and diastolic (congestive) heart failure: Secondary | ICD-10-CM | POA: Diagnosis not present

## 2021-07-07 DIAGNOSIS — I4891 Unspecified atrial fibrillation: Secondary | ICD-10-CM | POA: Diagnosis not present

## 2021-07-07 DIAGNOSIS — F319 Bipolar disorder, unspecified: Secondary | ICD-10-CM

## 2021-07-07 LAB — COMPREHENSIVE METABOLIC PANEL
ALT: 12 U/L (ref 0–44)
AST: 18 U/L (ref 15–41)
Albumin: 3.8 g/dL (ref 3.5–5.0)
Alkaline Phosphatase: 27 U/L — ABNORMAL LOW (ref 38–126)
Anion gap: 13 (ref 5–15)
BUN: 31 mg/dL — ABNORMAL HIGH (ref 8–23)
CO2: 25 mmol/L (ref 22–32)
Calcium: 9.5 mg/dL (ref 8.9–10.3)
Chloride: 103 mmol/L (ref 98–111)
Creatinine, Ser: 1.52 mg/dL — ABNORMAL HIGH (ref 0.44–1.00)
GFR, Estimated: 37 mL/min — ABNORMAL LOW (ref >60)
Glucose, Bld: 121 mg/dL — ABNORMAL HIGH (ref 70–99)
Potassium: 3.8 mmol/L (ref 3.5–5.1)
Sodium: 141 mmol/L (ref 135–145)
Total Bilirubin: 0.5 mg/dL (ref 0.3–1.2)
Total Protein: 7.1 g/dL (ref 6.5–8.1)

## 2021-07-07 LAB — CBC WITH DIFFERENTIAL/PLATELET
Abs Immature Granulocytes: 0.01 10*3/uL (ref 0.00–0.07)
Basophils Absolute: 0.1 10*3/uL (ref 0.0–0.1)
Basophils Relative: 1 %
Eosinophils Absolute: 0.2 10*3/uL (ref 0.0–0.5)
Eosinophils Relative: 3 %
HCT: 33.5 % — ABNORMAL LOW (ref 36.0–46.0)
Hemoglobin: 10.5 g/dL — ABNORMAL LOW (ref 12.0–15.0)
Immature Granulocytes: 0 %
Lymphocytes Relative: 29 %
Lymphs Abs: 2 10*3/uL (ref 0.7–4.0)
MCH: 29.7 pg (ref 26.0–34.0)
MCHC: 31.3 g/dL (ref 30.0–36.0)
MCV: 94.9 fL (ref 80.0–100.0)
Monocytes Absolute: 0.7 10*3/uL (ref 0.1–1.0)
Monocytes Relative: 10 %
Neutro Abs: 4 10*3/uL (ref 1.7–7.7)
Neutrophils Relative %: 57 %
Platelets: 236 10*3/uL (ref 150–400)
RBC: 3.53 MIL/uL — ABNORMAL LOW (ref 3.87–5.11)
RDW: 14.5 % (ref 11.5–15.5)
WBC: 7 10*3/uL (ref 4.0–10.5)
nRBC: 0 % (ref 0.0–0.2)

## 2021-07-07 LAB — PHOSPHORUS: Phosphorus: 4 mg/dL (ref 2.5–4.6)

## 2021-07-07 LAB — RESP PANEL BY RT-PCR (FLU A&B, COVID) ARPGX2
Influenza A by PCR: NEGATIVE
Influenza B by PCR: NEGATIVE
SARS Coronavirus 2 by RT PCR: NEGATIVE

## 2021-07-07 LAB — HEPARIN LEVEL (UNFRACTIONATED): Heparin Unfractionated: 0.68 IU/mL (ref 0.30–0.70)

## 2021-07-07 LAB — MAGNESIUM: Magnesium: 2 mg/dL (ref 1.7–2.4)

## 2021-07-07 LAB — PROTIME-INR
INR: 1.4 — ABNORMAL HIGH (ref 0.8–1.2)
Prothrombin Time: 17 seconds — ABNORMAL HIGH (ref 11.4–15.2)

## 2021-07-07 MED ORDER — TRAZODONE HCL 100 MG PO TABS: 100.0000 mg | ORAL_TABLET | Freq: Every day | ORAL | Status: DC

## 2021-07-07 MED ORDER — SACUBITRIL-VALSARTAN 24-26 MG PO TABS: 1.0000 | ORAL_TABLET | Freq: Two times a day (BID) | ORAL | 0 refills | Status: DC

## 2021-07-07 MED ORDER — MELATONIN 5 MG PO TABS: 10.0000 mg | ORAL_TABLET | Freq: Every day | ORAL | Status: DC

## 2021-07-07 MED ORDER — LIP MEDEX EX OINT: TOPICAL_OINTMENT | CUTANEOUS | 0 refills | Status: DC | PRN

## 2021-07-07 MED ORDER — CARVEDILOL 12.5 MG PO TABS: 12.5000 mg | ORAL_TABLET | Freq: Two times a day (BID) | ORAL | Status: DC

## 2021-07-07 MED ORDER — ONDANSETRON HCL 4 MG PO TABS: 4.0000 mg | ORAL_TABLET | Freq: Four times a day (QID) | ORAL | 0 refills | Status: DC | PRN

## 2021-07-07 MED ORDER — ENOXAPARIN SODIUM 100 MG/ML IJ SOSY: 100.0000 mg | PREFILLED_SYRINGE | Freq: Two times a day (BID) | INTRAMUSCULAR | Status: DC

## 2021-07-07 MED ORDER — VITAMIN D3 25 MCG PO TABS: 1000.0000 [IU] | ORAL_TABLET | Freq: Every day | ORAL | Status: DC

## 2021-07-07 MED ORDER — ENOXAPARIN SODIUM 100 MG/ML IJ SOSY
100.0000 mg | PREFILLED_SYRINGE | Freq: Two times a day (BID) | INTRAMUSCULAR | Status: DC
  Administered 2021-07-07: 100 mg via SUBCUTANEOUS
  Filled 2021-07-07: qty 1

## 2021-07-07 MED ORDER — DAPAGLIFLOZIN PROPANEDIOL 10 MG PO TABS: 10.0000 mg | ORAL_TABLET | Freq: Every day | ORAL | 0 refills | Status: AC

## 2021-07-07 MED ORDER — POTASSIUM CHLORIDE CRYS ER 20 MEQ PO TBCR: 20.0000 meq | EXTENDED_RELEASE_TABLET | Freq: Every day | ORAL | Status: DC

## 2021-07-07 MED ORDER — WARFARIN SODIUM 2.5 MG PO TABS
3.5000 mg | ORAL_TABLET | Freq: Once | ORAL | Status: AC
  Administered 2021-07-07: 3.5 mg via ORAL
  Filled 2021-07-07: qty 1

## 2021-07-07 NOTE — TOC Progression Note (Signed)
Transition of Care [facility]) - Progression Note    Patient Details  Name: [name] MRN: [id number] Date of Birth: [birth year]  Transition of Care [facility]) CM/SW [place], LCSW Phone Number:336 [phone] 07/07/2021, 1:01 PM  Clinical Narrative:     CSW received a call from pharmacy in regards to the pt's Lovenox being able to be administered 12 hours after first dose and twice a day. Pharmacy wanted to make sure that facility was able to give medication in the early morning hours.  CSW spoke with [place] at [facility] and she confirmed that facility could administer medication in the early morning hours.  TOC team will continue to assist with discharge planning needs.   Expected Discharge Plan: [place] Barriers to Discharge: Continued Medical Work up, Ship broker, SNF Pending bed offer  Expected Discharge Plan and Services Expected Discharge Plan: [place] arrangements for the past 2 months: Skilled Nursing Facility Expected Discharge Date: 07/07/21                                     Social Determinants of Health (SDOH) Interventions    Readmission Risk Interventions Readmission Risk Prevention Plan 05/03/2020  Transportation Screening Complete  Home Care Screening Complete  Medication Review (RN CM) Complete  Some recent data might be hidden

## 2021-07-07 NOTE — Progress Notes (Signed)
Report called to [employer] at [facility].  Reviewed d/c instructions with patient as well.  Copy given to patient to take to [facility].  Awaiting daughter to pick up for d/c.

## 2021-07-07 NOTE — Progress Notes (Signed)
ANTICOAGULATION CONSULT NOTE - Follow Up Consult  Pharmacy Consult for Heparin Indication: atrial fibrillation  Allergies  Allergen Reactions   Mucinex [Guaifenesin Er] Other (See Comments)    Severe headaches    Keflex [Cephalexin] Other (See Comments)    Headaches and dizziness   Keflex [Cephalexin] Other (See Comments)    Headaches and/or dizziness- "allergic," per [facility]   Mucinex [Guaifenesin Er] Other (See Comments)    Severe headaches- "allergic," per [facility]   Sulfa Antibiotics Other (See Comments)    headaches   Sulfa Antibiotics Other (See Comments)    Headaches- "allergic," per [facility]    Sulfonamide Derivatives Other (See Comments)    Headaches     Patient Measurements: Height: 5\' 8"  (172.7 cm) Weight: 93 kg (205 lb 1.6 oz) IBW/kg (Calculated) : 63.9 Heparin Dosing Weight: 84.9 kg  Vital Signs: Temp: 98.3 [name] (36.8 C) (02/12 0614) Temp Source: Oral (02/12 0614) BP: 158/99 (02/12 0807) Pulse Rate: 101 (02/12 0807)  Labs: Recent Labs    07/05/21 0216 07/05/21 1108 07/05/21 1109 07/06/21 0250 07/06/21 1127 07/06/21 2022 07/07/21 0306  HGB 9.9*   < > 10.9* 10.0*  --   --  10.5*  HCT 32.0*   < > 32.0* 31.7*  --   --  33.5*  PLT 248  --   --  258  --   --  236  LABPROT 15.8*  --   --  14.8  --   --  17.0*  INR 1.3*  --   --  1.2  --   --  1.4*  HEPARINUNFRC 0.49  --   --  0.24* 0.47 0.53 0.68  CREATININE 1.45*  --   --  1.53*  --   --  1.52*   < > = values in this interval not displayed.     Estimated Creatinine Clearance: 41 mL/min (A) (by C-G formula based on SCr of 1.52 mg/dL (H)).   Medications:  Scheduled:   carvedilol  12.5 mg Oral BID WC   cholecalciferol  1,000 Units Oral Daily   dapagliflozin propanediol  10 mg Oral Daily   docusate sodium  100 mg Oral BID   fenofibrate  160 mg Oral Daily   furosemide  40 mg Oral Daily   lamoTRIgine  100 mg Oral Daily   lamoTRIgine  200 mg Oral QHS   levothyroxine  25 mcg Oral QAC breakfast   pantoprazole   40 mg Oral Daily   potassium chloride  20 mEq Oral Daily   rosuvastatin  40 mg Oral Daily   sacubitril-valsartan  1 tablet Oral BID   sodium chloride flush  3 mL Intravenous Q12H   sodium chloride flush  3 mL Intravenous Q12H   cyanocobalamin  1,000 mcg Oral Daily   Warfarin - Pharmacist Dosing Inpatient   Does not apply q1600   Infusions:   sodium chloride     heparin 1,350 Units/hr (07/06/21 2332)    Assessment: 71 yo [name] with h/o recurrent AF and mechanical AV valve replacement, on warfarin at facility PTA.  Pharmacy consulted for heparin dosing (to start when INR < 2.5).   S/p cath on 2/10, heparin restarted 8 hours post-sheath removal (sheath removed ~11:30am).    Warfarin regimen PTA: 3.5 mg daily (recently reduced from 4 mg daily), total weekly dose 24.5 mg.   Today INR is 1.4 and below goal (INR goal 2.5-3.5).  Heparin level today is therapeutic at 0.68, on 1350 units/hr.  Hgb 10.5, plt 236.  No line issues or signs/symptoms of bleeding noted per RN.  Goal of Therapy:  Heparin level 0.3-0.7 units/ml INR goal 2.5-3.5 Monitor platelets by anticoagulation protocol: Yes   Plan:  Decrease heparin gtt slightly to 1300 units/hr, to keep within goal. Warfarin 3.5 mg PO today.  Check ~8 hr heparin level.  Daily CBC, heparin level, INR. Monitor for signs/symptoms of bleeding.   Vance Peper, PharmD PGY1 Pharmacy Resident Phone [phone] 07/07/2021 8:27 AM   Please check AMION for all [place] phone numbers After 10:00 PM, call [place] [phone]

## 2021-07-07 NOTE — TOC Transition Note (Signed)
Transition of Care [facility]) - CM/SW Discharge Note   Patient Details  Name: [name] MRN: [id number] Date of Birth: [birth year]  [facility] CM/SW Contact:  Bary Castilla, LCSW Phone Number: [phone] 07/07/2021, 2:38 PM   Clinical Narrative:     Patient will DC to:?[facility] Anticipated DC date:?07/07/2021 Family notified:?Amy Transport by: Daughter   Per MD patient ready for DC to [facility] RN, patient, patient's family, and facility notified of DC. Discharge Summary sent to facility. RN given number for report  [phone] room 305A. DC packet on chart. Daughter Amy to transport.  CSW signing off.   [place], [place] [phone]     Barriers to Discharge: Continued Medical Work up, Ship broker, SNF Pending bed offer   Patient Goals and CMS Choice Patient states their goals for this hospitalization and ongoing recovery are:: To be able to go home with family CMS Medicare.gov Compare Post Acute Care list provided to:: Patient Choice offered to / list presented to : Patient  Discharge Placement                       Discharge Plan and Services                                     Social Determinants of Health (SDOH) Interventions     Readmission Risk Interventions Readmission Risk Prevention Plan 05/03/2020  Transportation Screening Complete  Home Care Screening Complete  Medication Review (RN CM) Complete  Some recent data might be hidden

## 2021-07-07 NOTE — Progress Notes (Signed)
Progress Note  Patient Name: [name] Date of Encounter: 07/07/2021  [facility] Cardiologist: Sinclair Grooms, MD  EP: Dr. Lovena Le  Subjective   Ambulating in the room w/out SOB, has not been out in the hall Not sure what dry wt is at home, but has scales Is at [facility] rehab 2nd CVA, uses a walker  Inpatient Medications    Scheduled Meds:  carvedilol  12.5 mg Oral BID WC   cholecalciferol  1,000 Units Oral Daily   dapagliflozin propanediol  10 mg Oral Daily   docusate sodium  100 mg Oral BID   fenofibrate  160 mg Oral Daily   furosemide  40 mg Oral Daily   lamoTRIgine  100 mg Oral Daily   lamoTRIgine  200 mg Oral QHS   levothyroxine  25 mcg Oral QAC breakfast   pantoprazole  40 mg Oral Daily   potassium chloride  20 mEq Oral Daily   rosuvastatin  40 mg Oral Daily   sacubitril-valsartan  1 tablet Oral BID   sodium chloride flush  3 mL Intravenous Q12H   sodium chloride flush  3 mL Intravenous Q12H   cyanocobalamin  1,000 mcg Oral Daily   warfarin  3.5 mg Oral ONCE-1600   Warfarin - Pharmacist Dosing Inpatient   Does not apply q1600   Continuous Infusions:  sodium chloride     heparin 1,350 Units/hr (07/06/21 2332)   PRN Meds: sodium chloride, acetaminophen **OR** acetaminophen, albuterol, bisacodyl, hydrALAZINE, lip balm, melatonin, ondansetron **OR** ondansetron (ZOFRAN) IV, prochlorperazine, sodium chloride flush, SUMAtriptan   Vital Signs    Vitals:   07/06/21 1612 07/06/21 2030 07/07/21 0614 07/07/21 0807  BP: 102/63 110/82 132/66 (!) 158/99  Pulse:  72 70 (!) 101  Resp: 18 20 20 20   Temp:  97.7 F (36.5 C) 98.3 F (36.8 C)   TempSrc:  Axillary Oral   SpO2:  96%  96%  Weight:   93 kg   Height:        Intake/Output Summary (Last 24 hours) at 07/07/2021 0851 Last data filed at 07/07/2021 0615 Gross per 24 hour  Intake 466.26 ml  Output 650 ml  Net -183.74 ml   Last 3 Weights 07/07/2021 07/06/2021 07/05/2021  Weight (lbs) 205 lb 1.6 oz 208 lb  1.6 oz 207 lb 3.7 oz  Weight (kg) 93.033 kg 94.394 kg 94 kg      Telemetry     Atrial fib, rate generally controlled  -personally reviewed by me   ECG    None today  Physical Exam   Physical Exam: Blood pressure (!) 158/99, pulse (!) 101, temperature 98.3 F (36.8 C), temperature source Oral, resp. rate 20, height 5\' 8"  (1.727 m), weight 93 kg, SpO2 96 %.  General: Well developed, well nourished, female in no acute distress Head: Eyes PERRLA, Head normocephalic and atraumatic Lungs: clear bilaterally to auscultation. Heart: Irreg R&R, crisp valve click, slight murmur, + S1 S2, without rub or gallop.  4/4 extremity pulses are 2+ & equal. No JVD seen. Abdomen: Bowel sounds are present, abdomen soft and non-tender without masses or  hernias noted. Msk: Normal strength and tone for age. Extremities: No clubbing, cyanosis or edema.    Skin:  No rashes or lesions noted. Neuro: Alert and oriented X 3. Psych:  Good affect, responds appropriately  Labs    High Sensitivity Troponin:   Recent Labs  Lab 06/27/21 1443  TROPONINIHS 54*     Chemistry Recent Labs  Lab 07/05/21 0216 07/05/21 1108 07/05/21 1109 07/06/21 0250 07/07/21 0306  NA 140   < > 142 139 141  K 3.9   < > 4.1 3.9 3.8  CL 105  --   --  104 103  CO2 27  --   --  25 25  GLUCOSE 138*  --   --  166* 121*  BUN 34*  --   --  31* 31*  CREATININE 1.45*  --   --  1.53* 1.52*  CALCIUM 9.3  --   --  9.5 9.5  MG 1.8  --   --  1.8 2.0  PROT 6.5  --   --  6.8 7.1  ALBUMIN 3.5  --   --  3.8 3.8  AST 18  --   --  18 18  ALT 11  --   --  12 12  ALKPHOS 29*  --   --  29* 27*  BILITOT 0.6  --   --  0.6 0.5  GFRNONAA 39*  --   --  36* 37*  ANIONGAP 8  --   --  10 13   < > = values in this interval not displayed.    Hematology Recent Labs  Lab 07/05/21 0216 07/05/21 1108 07/05/21 1109 07/06/21 0250 07/07/21 0306  WBC 5.2  --   --  7.6 7.0  RBC 3.37*  --   --  3.39* 3.53*  HGB 9.9*   < > 10.9* 10.0* 10.5*  HCT  32.0*   < > 32.0* 31.7* 33.5*  MCV 95.0  --   --  93.5 94.9  MCH 29.4  --   --  29.5 29.7  MCHC 30.9  --   --  31.5 31.3  RDW 14.6  --   --  14.5 14.5  PLT 248  --   --  258 236   < > = values in this interval not displayed.   Thyroid  No results for input(s): TSH, FREET4 in the last 168 hours. Lab Results  Component Value Date   INR 1.4 (H) 07/07/2021   INR 1.2 07/06/2021   INR 1.3 (H) 07/05/2021    BNP Recent Labs  Lab 07/01/21 0645  BNP 273.4*    DDimer No results for input(s): DDIMER in the last 168 hours.   Radiology    CARDIAC CATHETERIZATION  Result Date: 07/05/2021 1.  Widely patent coronary arteries with no significant stenoses, right dominant coronary circulation  2.  Elevated right heart pressures and large V waves in the pulmonary wedge tracing of 38 mmHg  3.  Mild pulmonary hypertension with mean pulmonary artery pressure 33 mmHg, suspect secondary to left heart disease (transpulmonary gradient only 8 mmHg)  Recommend: Diuresis, medical therapy for CHF.   Cardiac Studies   Echo  06/28/2021 1. EF has decreased since echo done 2021 . Left ventricular ejection fraction, by estimation, is 25 to 30%. The left ventricle has severely decreased function. The left ventricle demonstrates global hypokinesis. The left ventricular internal cavity size  was moderately dilated. Left ventricular diastolic parameters are indeterminate.   2. Pacing wires in RV/RA. Right ventricular systolic function is normal. The right ventricular size is normal. There is mildly elevated pulmonary artery systolic pressure. RVSP 36.9  3. Left atrial size was moderately dilated.   4. The mitral valve is degenerative. Mild mitral valve regurgitation. No  evidence of mitral stenosis. Moderate mitral annular calcification.   5. Tricuspid valve regurgitation is mild to  moderate.   6. Post AVR with 25 mm St Jude trivial closing volume central AR low gradients normal function . The aortic valve has  been repaired/replaced. Aortic valve regurgitation is trivial. No aortic stenosis is present. There is a 25 mm St. Jude mechanical  valve present in the aortic position.   7. The inferior vena cava is normal in size with greater than 50%  respiratory variability, suggesting right atrial pressure of 3 mmHg.   Cardiac cath 07/05/2021 Conclusion  1.  Widely patent coronary arteries with no significant stenoses, right dominant coronary circulation 2.  Elevated right heart pressures and large V waves in the pulmonary wedge tracing of 38 mmHg 3.  Mild pulmonary hypertension with mean pulmonary artery pressure 33 mmHg, suspect secondary to left heart disease (transpulmonary gradient only 8 mmHg)   Recommend: Diuresis, medical therapy for CHF. Patient Profile     71 y.o. female with a hx of bicuspid AV/AS with ascending aortic aneurysm s/p resection/grafting of ascending aortic aneurysm and placement of St. Jude mechanical AV conduit/Bentall 2009, atrial fibrillation/flutter, bradycardia s/p Bos Sci PPM implantation 12/2019, multiple falls with prior craniotomy for subdural hematoma/subarachnoid hemorrhage, prior chronic infarcts on MRI 2021, bipolar disorder, chronic diastolic CHF (prior LVEF 40-81% in 2021, improved to 50-55% in 03/2020), anemia, depression, debilitation (wheelchair bound), DM, HLD, HTN, mild carotid artery disease 2021, back pain, obesity who is being seen  for the evaluation of CHF at the request of Benedetto Goad PA-C.  Assessment & Plan    Acute on chronic combined diastolic/systolic CHF - DOE worse x 3 weeks, initially on BiPAP - nl cors w/ lg V waves at 38 mmHg, PAS 33 - EF 25-30%, global HK mild-mod TR & MR - I/O incomplete, wt down 8 lbs from admit at 205 lbs, but at one point this admit was 202 lbs, continue to follow - Cr above her baseline, but stable overnight at 1.52 - Entresto 24/26 started last pm, follow VS and renal function - increase activity and see how  tolerated -Check bmet in 1 week - f/u in CHF clinic  2. Atrial fibrillation w/ bradycardia, s/p BSCi PPM 2021 - rarely paces - INR increasing, coumadin restarted 02/10 - need S.W. to see if [facility] can give her lovenox, consult has been placed - if so, can continue to load coumadin as an outpt  3.  Bicuspid aortic valve with aortic stenosis/ascending aortic aneurysm, s/p resection and grafting of ascending aortic aneurysm, w/ 23 mm St Jude mechanical AVR conduit/Bentall in 2009 -Echo this admission showed a stable 23 mm Saint Jude mechanical aortic valve replacement with trivial AI and mean aortic valve gradient 6 mmHg -Continue IV heparin while reloading Coumadin  4.  Chronic kidney disease stage III - Cr slightly above baseline but stable   5.  Hypertension - BP up this am, but generally well-controlled >> follow - continue Coreg 12.5, Lasix 40 and Entresto 24-26  I have spent a total of 40 minutes with patient reviewing cardiac cath, 2D echo, prior notes from office, telemetry, EKGs, labs and examining patient as well as establishing an assessment and plan that was discussed with the patient.  > 50% of time was spent in direct patient care.      For questions or updates, please contact Erma Please consult [email or web address] for contact info under      Rosaria Ferries, PA-C  07/07/2021 8:51 AM    [place],  [place], [place]  [address] Phone: [phone]; Fax: [phone]

## 2021-07-07 NOTE — TOC Progression Note (Signed)
Transition of Care [facility]) - Progression Note    Patient Details  Name: [name] MRN: [id number] Date of Birth: [birth year]  Transition of Care [facility]) CM/SW [place], [place] Phone Number: [phone] 07/07/2021, 11:18 AM  Clinical Narrative:     CSW was alerted to check with the facility to ascertain if pt could get Lovenox Injections and Coumadin at the facility. CSW spoke with [place] and she stated that it should be fine for pt to return on those medications. Pt wlll also need a COVID test prior to DC.  TOC team will continue to assist with discharge planning needs.   Expected Discharge Plan: [place] Barriers to Discharge: Continued Medical Work up  Expected Discharge Plan and Services Expected Discharge Plan: [place] arrangements for the past 2 months: [place]                                       Social Determinants of Health (SDOH) Interventions    Readmission Risk Interventions Readmission Risk Prevention Plan 05/03/2020  Transportation Screening Complete  Home Care Screening Complete  Medication Review (RN CM) Complete  Some recent data might be hidden

## 2021-07-07 NOTE — TOC Progression Note (Signed)
Transition of Care [facility]) - Progression Note    Patient Details  Name: [name] MRN: [id number] Date of Birth: 08-16-1950  Transition of Care [facility]) CM/SW [place], [place] Phone Number: [phone] 07/07/2021, 1:16 PM  Clinical Narrative:     CSW spoke with pt's daughter Amy because she wanted to verify that facility was able to accept pt. Amy explained that she is willing to transport pt. CSW confirmed with Irine Seal that pt's daughter can transport pt.  TOC team will continue to assist with discharge planning needs.     Expected Discharge Plan: [place] Barriers to Discharge: Continued Medical Work up, Ship broker, SNF Pending bed offer  Expected Discharge Plan and Services Expected Discharge Plan: [employer] arrangements for the past 2 months: Skilled Nursing Facility Expected Discharge Date: 07/07/21                                     Social Determinants of Health (SDOH) Interventions    Readmission Risk Interventions Readmission Risk Prevention Plan 05/03/2020  Transportation Screening Complete  Home Care Screening Complete  Medication Review (RN CM) Complete  Some recent data might be hidden

## 2021-07-07 NOTE — Discharge Summary (Addendum)
Physician Discharge Summary   Patient: [name] MRN: [id number] DOB: [birth year]  Admit date:     06/27/2021  Discharge date: 07/07/21  Discharge Physician: Kerney Elbe   PCP: Mosie Lukes, MD   Recommendations at discharge:   Follow-up with PCP within 1 to 2 weeks Follow-up with cardiology in 1 to 2 weeks and be established with the advanced heart failure clinic and repeat PT/INR within 2 to 3 days of discharge Continue the Lovenox bridge with Coumadin until INR is >2.5 and have outpatient physicians adjust; Script written for 7 days but may need longer  Discharge Diagnoses: Principal Problem:   Acute hypoxemic respiratory failure ([place]) Active Problems:   Atrial fibrillation ([place])   Hyperlipidemia associated with type 2 diabetes mellitus ([place])   Recurrent falls   Pacemaker   Vitamin B 12 deficiency   Acute on chronic combined systolic and diastolic CHF (congestive heart failure) ([place])   Essential hypertension   GERD without esophagitis   Atrial fibrillation and flutter (HCC)   Bipolar disorder (HCC)   Hypothyroidism   S/P AVR   History of CVA (cerebrovascular accident)   Stage 3a chronic kidney disease (CKD) (HCC)   Anemia of chronic kidney failure, stage 3 (moderate) (HCC)   Obesity (BMI 30-39.9)  Resolved Problems:   * No resolved hospital problems. [facility] Hospital Course: The patient is a 71 year old obese Caucasian female with a past medical history significant for breast admitted to [facility] aortic valve that was placed in 2011, PAF with CHA2DS2-VASc score of greater than 5 and history of embolic strokes and history of multiple falls with subdural bleeds requiring craniotomy, diabetes mellitus type 2, bipolar disorder, history of migraines, history of hypertension, hyperlipidemia, syncope, history of tachybradycardia syndrome status post pacemaker placement, as well as other comorbidities who lives in a nursing home and presented with shortness of breath and was  admitted with a diagnosis of acute respiratory failure with hypoxia in the setting of acute on chronic systolic and diastolic CHF.  Cardiology was consulted and she was initiated on IV diuresis and responded well.  Patient continues to have some dyspnea and cardiology feels that since she is only improved slightly with diuresis and controlling heart rate there planning for right and left heart catheterization and this will be done on Friday 07/05/21.    Cardiology has initiated a heparin drip and she was given Vitamin K and Coumadin held INR dropped to 1.3.  PT OT recommending SNF.  Will need to monitor her symptoms as she was dizzy  and nauseous but is now improving.   She underwent Cath and it showed Widely patent coronary arteries with no significant stenoses, right dominant coronary circulation but did also show Elevated right heart pressures and large V waves in the pulmonary wedge tracing of 38 mmHg. Additionally there was mild pulmonary hypertension with mean pulmonary artery pressure 33 mmHg, suspect secondary to left heart disease (transpulmonary gradient only 8 mmHg) and the recommendation was for Diuresis and medical therapy for CHF.   Cardiology started the patient on p.o. Lasix 40 mg grams daily and recommended continuing carvedilol 12.5 mg p.o. twice daily.  They changed her losartan to Entresto 24-26 mg p.o. twice daily and added Farxiga 10 mg p.o. daily and recommended considering adding spironolactone as renal function remained stable in outpatient setting.  Cardiology recommend establishing the patient in the advanced heart failure clinic.   Patient improved significantly and she was deemed medically stable to be discharged.  Patient was able to be transitioned to Lovenox full dose injections as a bridge for her Coumadin as her INR still not therapeutic.  Discharge INR 1.4 she was told she needed to repeat her INR within 2 to 3 days at her facility and follow-up with cardiology in outpatient  setting.  All questions were answered and patient felt well and back to her baseline.  She denied any shortness of breath.  She will follow-up with her PCP as well as her cardiologist in the outpatient setting.    Assessment and Plan: * Acute hypoxemic respiratory failure (HCC)- (present on admission) - She had acute hypoxic respiratory failure in setting of acute on chronic systolic and diastolic CHF with reduced EF from a few years ago -She missed some doses of Lasix at SNF due to some miscommunication but she denies any chest pain and did not have any acute EKG changes -IV Lasix has now stopped  -She had to be placed on BiPAP initially but is now weaned to room air -She was on Lasix per cardiology recommendations but this has been discontinued given that she is not volume overloaded on exam -SpO2: 97 % O2 Flow Rate (L/min): 3 L/min FiO2 (%): 40 %; no longer wearing supplemental oxygen via nasal cannula and has been off oxygen for 4 days -Dr. Candiss Norse discussed with the cardiologist yesterday and they increased her Coreg and ARB but now her ARB has been changed to [facility] -She will need to follow-up with Dr. Tamala Julian her primary cardiologist at discharge but cardiology is planning a cardiac catheterization this hospitalization and now done and recommending continued diuresis -Continuous pulse oximetry maintain O2 saturation greater than 90% -Repeat COVID Negative and CXR showed "No evidence of acute chest disease. Cardiomegaly with aortic valve prosthesis and pacemaker."    Obesity (BMI 76-73.4) -Complicates overall prognosis and care -Estimated body mass index is 31.41 kg/m as calculated from the following:   Height as of this encounter: 5\' 8"  (1.727 m).   Weight as of this encounter: 93.7 kg.  -Weight Loss and Dietary Counseling given   Anemia of chronic kidney failure, stage 3 (moderate) (HCC) -Patient's Hb/Hct went from 11.7/36.4 -> 10.7/34.2 -> 10.0/32.0 -> 9.9/32.0 -> 10.9/32.0 ->  10.0/31.7 -> 10.5/33.5  -Check anemia panel as an outpatient -Continue to monitor for signs and symptoms of bleeding as she is anticoagulated and being Heparin drip and Coumadin; no overt bleeding noted -Repeat CBC in the a.m.  Stage 3a chronic kidney disease (CKD) (HCC) -Patient's baseline creatinine is between 1.2 and 1.4 -BUN/creatinine went from 40/1.52 -> 33/1.32 -> 32/1.30 -> 32/1.36 -> 34/1.45 today was 31/1.53 -> 31/1.52 -Continue monitor renal function as she is now had the addition of Cozaar as well as getting a Cardiac Cath -Cozaar has not been discontinued and cardiology initiating Entresto 24-26 mg p.o. twice daily and adding Farxiga 10 mg p.o. daily -Avoid nephrotoxic medications, contrast dyes, hypotension and renally dose medications if possible  -Repeat CMP within 1 wee  History of CVA (cerebrovascular accident) -Has had embolic strokes due to A-fib with multiple falls and subdural hematoma and bleeds -Continue supportive care and PT OT feel that she is a high fall risk -She will be discharged back to SNF once medically stable and cleared by cardiology and she is and now since that she is getting discharged we will change her to Coumadin and Lovenox injections.   S/P AVR -Had a repeat echocardiogram done which showed a post AVR with a 25 mm [facility]  Jude trivial closing volume central AR low-grade and normal function -Her aortic valve regurgitation is trivial and her aortic stenosis is present -Cardiology evaluated and felt that this was reassuring for her -Appreciate Card's recc's; Resuming Coumadin and will need to make sure it's therapeutic given AVR and so she is now back on a heparin drip while reloading her Coumadin -Cardiology feels that she can be discharged on Lovenox injections and addition of Coumadin if she gets discharged -Patient is status post resection and grafting of ascending aortic aneurysm and placement of a 23 mm [employer] Jude mechanical AVR conduit/[place] in  2009 -Appreciate cardiology's recommendations and continue monitor INR daily as it was 1.4 today -**Discharged the patient on full dose Lovenox 100 mg subcu every 12 in addition to Coumadin and have the patient repeat her INR in 2 to 3 days   Hypothyroidism- (present on admission) -C/w Levothyroxine 25 mcg p.o. daily  Bipolar disorder ([place])- (present on admission) -C/w Lamotrigine 100 mg p.o. daily and 200 mg p.o. nightly  Atrial fibrillation and flutter ([place])- (present on admission) -HR is improving and she had an ECHOCardiogram -Her Coumadin was being held and she is being started on a heparin drip given INR below her goal; INR is now 1.4 and Cardiology cleared patient for a cath and results as above; cardiology recommending resuming anticoagulation with heparin drip as well as Coumadin and if she gets discharged can likely be discharged on Lovenox injections and Coumadin -Continue to monitor on telemetry -Further care per Cardiology  GERD without esophagitis- (present on admission) -Continue with pantoprazole 40 mg p.o. daily  Essential hypertension- (present on admission) -Continue with Carvedilol 12.5 milligrams p.o. twice daily  -Cardiology stopping her losartan and changing to p.o. Entresto 24-26 mg p.o. twice daily -Continue with hydralazine 10 mg IV every 6 as needed for systolic blood pressure greater than 150 -Continue to monitor blood pressures per protocol -Last blood pressure reading was 114/67  Acute on chronic combined systolic and diastolic CHF (congestive heart failure) (HCC)- (present on admission) -Repeat echocardiogram done here showed an LVEF of 25 to 30% with severely decreased function in the left ventricle demonstrates global hypokinesis and left ventricular internal cavity size is moderately dilated with the ventricular diastolic parameters being indeterminate -She had 3 weeks of progressively worsening dyspnea with edema -Had elevated JVP on arrival and  initially required BiPAP -BNP was elevated at 854.4 on admission and trended down to 273.4 -Currently getting diuresis and has improving slightly -INR is 2.8 and cardiology is planning for right and left heart catheterization Friday and recommending stopping Coumadin, given vitamin K 2.5 mg p.o. today and starting a heparin drip -Continue with albuterol 3 mL IH every 6 as needed for wheezing or shortness of breath -Appreciate cardiology assistance and recommendations -Her diuresis had been held but after cath cardiology recommends continuing diuresis and medical therapy for CHF; Continue Diuresis per Cardiology  -Continue strict I's and O's and daily weights; patient's I's and O's were inaccurate yesterday but have been corrected and now she is -2.961 L since admission -Weight is down from 213 pounds down to 206.57 yesterday and today is 207 -Continue to monitor for signs and symptoms of volume overload and repeat chest x-ray yesterday a.m. showed "No evidence of acute chest disease. Cardiomegaly with aortic valve prosthesis and pacemaker."     Vitamin B 12 deficiency- (present on admission) -Continue with vitamin B12 1000 mcg p.o. daily  Pacemaker- (present on admission) - She is status post [facility]  Scientific permanent pacemaker implant 2021 for bradycardia  Recurrent falls -PT/OT recommending returning to SNF with PT/OT and will discharge once stable from a cardiac perspective  Hyperlipidemia associated with type 2 diabetes mellitus ([place])- (present on admission) -Continue with Rosuvastatin 40 mg p.o. daily as well as Fenofibrate 160 mg p.o. daily  Atrial fibrillation ([place])- (present on admission) -Has proximal atrial fibrillation with a CHA2DS2-VASc of greater than 5 and history of embolic strokes -She was getting anticoagulation with Coumadin which had been held and switched to heparin drip in anticipation for cardiac cath; not have a cath is done we will resume her  anticoagulation -Her last INR went from 2.8 -> 2.0 -> 1.3 -> 1.2 -> 1.4 -Cardiology increased her Coreg dosing-on 07/02/2021 -Continue to monitor on telemetry and appreciate further evaluation recommendation by cardiology    Pain control - [facility] Controlled Substance Reporting System database was reviewed. and patient was instructed, not to drive, operate heavy machinery, perform activities at heights, swimming or participation in water activities or provide baby-sitting services while on Pain, Sleep and Anxiety Medications; until their outpatient Physician has advised to do so again. Also recommended to not to take more than prescribed Pain, Sleep and Anxiety Medications.   Consultants: Cardiology Procedures performed: Cardiac Cath, ECHO Disposition: Skilled nursing facility Diet recommendation:  Discharge Diet Orders (From admission, onward)     Start     Ordered   07/07/21 0000  Diet - low sodium heart healthy        07/07/21 1259           Cardiac diet  DISCHARGE MEDICATION: Allergies as of 07/07/2021       Reactions   Mucinex [guaifenesin Er] Other (See Comments)   Severe headaches   Keflex [cephalexin] Other (See Comments)   Headaches and dizziness   Keflex [cephalexin] Other (See Comments)   Headaches and/or dizziness- "allergic," per MAR   Mucinex [guaifenesin Er] Other (See Comments)   Severe headaches- "allergic," per MAR   Sulfa Antibiotics Other (See Comments)   headaches   Sulfa Antibiotics Other (See Comments)   Headaches- "allergic," per MAR   Sulfonamide Derivatives Other (See Comments)   Headaches        Medication List     TAKE these medications    acetaminophen 325 MG tablet Commonly known as: TYLENOL Take 325 mg by mouth every 4 (four) hours as needed for mild pain.   albuterol 108 (90 Base) MCG/ACT inhaler Commonly known as: VENTOLIN HFA Inhale 2 puffs into the lungs at bedtime.   carvedilol 12.5 MG tablet Commonly known as:  COREG Take 1 tablet (12.5 mg total) by mouth 2 (two) times daily with a meal. What changed:  medication strength how much to take when to take this   dapagliflozin propanediol 10 MG Tabs tablet Commonly known as: FARXIGA Take 1 tablet (10 mg total) by mouth daily. Start taking on: July 08, 2021   enoxaparin 100 MG/ML injection Commonly known as: LOVENOX Inject 1 mL (100 mg total) into the skin every 12 (twelve) hours for 7 days.   fenofibrate 160 MG tablet Take 1 tablet (160 mg total) by mouth daily.   fexofenadine 60 MG tablet Commonly known as: ALLEGRA Take 60 mg by mouth daily.   furosemide 40 MG tablet Commonly known as: LASIX Take 1 tablet (40 mg total) by mouth daily.   lamoTRIgine 100 MG tablet Commonly known as: LAMICTAL Take 100 mg by mouth daily.   lamoTRIgine 200 MG  tablet Commonly known as: LAMICTAL Take 200 mg by mouth at bedtime.   levothyroxine 25 MCG tablet Commonly known as: SYNTHROID TAKE 1 TABLET BY MOUTH DAILY BEFORE BREAKFAST.   lip balm ointment Apply topically as needed for lip care.   Melatonin 10 MG Tabs Take 10 mg by mouth at bedtime.   Miconazole Powd Apply 1 application topically 4 (four) times daily as needed (vaginal itching and odor). Patient may keep at bedside   ondansetron 4 MG tablet Commonly known as: ZOFRAN Take 1 tablet (4 mg total) by mouth every 6 (six) hours as needed for nausea.   pantoprazole 40 MG tablet Commonly known as: PROTONIX Take 1 tablet (40 mg total) by mouth daily.   potassium chloride SA 20 MEQ tablet Commonly known as: KLOR-CON M Take 1 tablet (20 mEq total) by mouth daily. Start taking on: July 08, 2021   rosuvastatin 40 MG tablet Commonly known as: CRESTOR Take 40 mg by mouth daily.   sacubitril-valsartan 24-26 MG Commonly known as: ENTRESTO Take 1 tablet by mouth 2 (two) times daily.   traZODone 50 MG tablet Commonly known as: DESYREL Take 100 mg by mouth at bedtime.    venlafaxine XR 75 MG 24 hr capsule Commonly known as: Effexor XR Take 1 capsule (75 mg total) by mouth daily with breakfast.   Vitamin D3 25 MCG tablet Commonly known as: Vitamin D Take 1 tablet (1,000 Units total) by mouth daily. Start taking on: July 08, 2021 What changed:  medication strength how much to take   warfarin 2.5 MG tablet Commonly known as: COUMADIN Take 2.5 mg by mouth daily. Give with 1 mg tab = 3.5 mg total What changed: Another medication with the same name was removed. Continue taking this medication, and follow the directions you see here.   warfarin 1 MG tablet Commonly known as: COUMADIN Take 1 tablet by mouth daily. Give with 2.5 mg tab. (Total 3.5 mg daily) What changed: Another medication with the same name was removed. Continue taking this medication, and follow the directions you see here.        Follow-up Information     Mosie Lukes, MD. Schedule an appointment as soon as possible for a visit in 1 week(s).   Specialty: Family Medicine Contact information: [address] [place] [address] [phone]         Belva Crome, MD. Schedule an appointment as soon as possible for a visit in 1 week(s).   Specialty: Cardiology Contact information: [address] [place] [address] [phone]         [place]. Go to.   Specialty: Cardiology Why: Monday, February  @ 10AM for [facility] within [place] [facility]. Bring all your medications with you.  FREE valet parking at [employer], off [address] [employer]. Contact information: [address] [id number] [place] [phone]                Discharge Exam: Filed Weights   07/05/21 0400 07/06/21 0435 07/07/21 0614  Weight: 94 kg 94.4 kg 93 kg   Vitals:   07/07/21 1147 07/07/21 1155  BP: 100/86 114/67  Pulse: 71 85  Resp: 15 17  Temp: 98.7 F  (37.1 C) (!) 96.5 F (35.8 C)  SpO2: 97%    Examination: Physical Exam:  Constitutional: WN/WD obese Caucasian Female in NAD and appears calm and sitting  in the chair  Respiratory: Diminished to auscultation bilaterally, no wheezing, rales, rhonchi or crackles. Normal respiratory effort and patient is not tachypenic. No accessory muscle use. Unlabored noted  Cardiovascular: Irregularly Irregular, Has a 2/6 Systolic Murmur and a click. Trace extremity edema.  Abdomen: Soft, non-tender, Distended 2/2 body habitus. Bowel sounds positive.  GU: Deferred.  Condition at discharge: stable  The results of significant diagnostics from this hospitalization (including imaging, microbiology, ancillary and laboratory) are listed below for reference.   Imaging Studies: CARDIAC CATHETERIZATION  Result Date: 07/05/2021 1.  Widely patent coronary arteries with no significant stenoses, right dominant coronary circulation 2.  Elevated right heart pressures and large V waves in the pulmonary wedge tracing of 38 mmHg 3.  Mild pulmonary hypertension with mean pulmonary artery pressure 33 mmHg, suspect secondary to left heart disease (transpulmonary gradient only 8 mmHg) Recommend: Diuresis, medical therapy for CHF.  DG CHEST PORT 1 VIEW  Result Date: 07/05/2021 CLINICAL DATA:  Dyspnea. EXAM: PORTABLE CHEST 1 VIEW COMPARISON:  Portable chest 06/27/2021. FINDINGS: The heart is enlarged. There is an aortic valve prosthesis, old median sternotomy sutures and dual lead left chest pacing system with stable wires. There are overlying monitor wires on the right. The lungs show mild chronic changes but are clear of infiltrates. No pleural collection is seen. Osteopenia. Right axillary surgical clips. Interval resolution of the prior CHF findings and pleural effusions. Slight elevation right diaphragm.  Osteopenia. IMPRESSION: No evidence of acute chest disease. Cardiomegaly with aortic valve prosthesis and pacemaker.  Electronically Signed   By: Telford Nab M.D.   On: 07/05/2021 07:04   DG Chest Port 1 View  Result Date: 06/27/2021 CLINICAL DATA:  Shortness of breath. Progressive worsening over 3 weeks. EXAM: PORTABLE CHEST 1 VIEW COMPARISON:  AP chest views 07/23/2020 FINDINGS: Left chest wall cardiac pacer is again seen with leads overlying the right atrium and right ventricle. Status post median sternotomy. Cardiac silhouette is again moderately enlarged. Cardiac valve prosthesis is again noted overlying the mid heart. Mediastinal contours are grossly within normal limits. Mild interval increase in mild-to-moderate interstitial thickening bilaterally. Possible tiny bilateral pleural effusions. No pneumothorax. No acute skeletal abnormality. Right axillary surgical clips are again noted. IMPRESSION: Interval increase in mild-to-moderate interstitial thickening likely mild to moderate interstitial pulmonary edema. Stable cardiomegaly. Electronically Signed   By: Yvonne Kendall M.D.   On: 06/27/2021 14:54   ECHOCARDIOGRAM COMPLETE  Result Date: 06/28/2021    ECHOCARDIOGRAM REPORT   Patient Name:   [name] Date of Exam: 06/28/2021 Medical Rec #:  [id number]     Height:       68.0 in Accession #:    [id number]    Weight:       207.0 lb Date of Birth:  10-06-1950      BSA:          2.074 m Patient Age:    58 years      BP:           108/75 mmHg Patient Gender: F             HR:           102 bpm. Exam Location:  Inpatient Procedure: 2D Echo, 3D Echo, Intracardiac Opacification Agent, Cardiac Doppler            and Color Doppler Indications:    I50.40* Unspecified combined systolic (congestive) and diastolic                 (  congestive) heart failure  History:        Patient has prior history of Echocardiogram examinations, most                 recent 03/28/2020. Abnormal ECG and Pacemaker, Aortic Valve                 Disease, Arrythmias:Tachycardia, Bradycardia and Atrial                 Fibrillation,  Signs/Symptoms:Syncope and Altered Mental Status;                 Risk Factors:Dyslipidemia and Hypertension.                 Aortic Valve: 25 mm St. Jude mechanical valve is present in the                 aortic position.  Sonographer:    Roseanna Rainbow RDCS Referring Phys: [id number] Margaree Mackintosh [facility]  Sonographer Comments: Technically difficult study due to poor echo windows. Image acquisition challenging due to patient body habitus. IMPRESSIONS  1. EF has decreased since echo done 2021 . Left ventricular ejection fraction, by estimation, is 25 to 30%. The left ventricle has severely decreased function. The left ventricle demonstrates global hypokinesis. The left ventricular internal cavity size  was moderately dilated. Left ventricular diastolic parameters are indeterminate.  2. Pacing wires in RV/RA. Right ventricular systolic function is normal. The right ventricular size is normal. There is mildly elevated pulmonary artery systolic pressure.  3. Left atrial size was moderately dilated.  4. The mitral valve is degenerative. Mild mitral valve regurgitation. No evidence of mitral stenosis. Moderate mitral annular calcification.  5. Tricuspid valve regurgitation is mild to moderate.  6. Post AVR with 25 mm St Jude trivial closing volume central AR low gradients normal function . The aortic valve has been repaired/replaced. Aortic valve regurgitation is trivial. No aortic stenosis is present. There is a 25 mm St. Jude mechanical valve present in the aortic position.  7. The inferior vena cava is normal in size with greater than 50% respiratory variability, suggesting right atrial pressure of 3 mmHg. FINDINGS  Left Ventricle: EF has decreased since echo done 2021. Left ventricular ejection fraction, by estimation, is 25 to 30%. The left ventricle has severely decreased function. The left ventricle demonstrates global hypokinesis. Definity contrast agent was given IV to delineate the left ventricular endocardial borders. The  left ventricular internal cavity size was moderately dilated. There is no left ventricular hypertrophy. Left ventricular diastolic parameters are indeterminate. Right Ventricle: Pacing wires in RV/RA. The right ventricular size is normal. No increase in right ventricular wall thickness. Right ventricular systolic function is normal. There is mildly elevated pulmonary artery systolic pressure. The tricuspid regurgitant velocity is 2.69 m/s, and with an assumed right atrial pressure of 8 mmHg, the estimated right ventricular systolic pressure is 83.1 mmHg. Left Atrium: Left atrial size was moderately dilated. Right Atrium: Right atrial size was normal in size. Pericardium: There is no evidence of pericardial effusion. Mitral Valve: The mitral valve is degenerative in appearance. There is moderate thickening of the mitral valve leaflet(s). There is moderate calcification of the mitral valve leaflet(s). Moderate mitral annular calcification. Mild mitral valve regurgitation. No evidence of mitral valve stenosis. MV peak gradient, 6.8 mmHg. The mean mitral valve gradient is 3.5 mmHg. Tricuspid Valve: The tricuspid valve is normal in structure. Tricuspid valve regurgitation is mild to moderate. No evidence of tricuspid stenosis.  Aortic Valve: Post AVR with 25 mm St Jude trivial closing volume central AR low gradients normal function. The aortic valve has been repaired/replaced. Aortic valve regurgitation is trivial. No aortic stenosis is present. Aortic valve mean gradient measures 6.0 mmHg. Aortic valve peak gradient measures 11.2 mmHg. Aortic valve area, by VTI measures 2.65 cm. There is a 25 mm St. Jude mechanical valve present in the aortic position. Pulmonic Valve: The pulmonic valve was normal in structure. Pulmonic valve regurgitation is not visualized. No evidence of pulmonic stenosis. Aorta: The aortic root is normal in size and structure. Venous: The inferior vena cava is normal in size with greater than 50%  respiratory variability, suggesting right atrial pressure of 3 mmHg. IAS/Shunts: No atrial level shunt detected by color flow Doppler. Additional Comments: A device lead is visualized.  LEFT VENTRICLE PLAX 2D LVIDd:         6.00 cm LVIDs:         5.10 cm LV PW:         1.00 cm LV IVS:        1.00 cm LVOT diam:     2.00 cm LV SV:         58 LV SV Index:   28 LVOT Area:     3.14 cm  RIGHT VENTRICLE            IVC RV S prime:     6.90 cm/s  IVC diam: 1.95 cm TAPSE (M-mode): 1.5 cm LEFT ATRIUM              Index        RIGHT ATRIUM           Index LA diam:        4.40 cm  2.12 cm/m   RA Area:     16.40 cm LA Vol (A2C):   122.0 ml 58.83 ml/m  RA Volume:   45.60 ml  21.99 ml/m LA Vol (A4C):   83.8 ml  40.41 ml/m LA Biplane Vol: 105.0 ml 50.63 ml/m  AORTIC VALVE AV Area (Vmax):    2.39 cm AV Area (Vmean):   2.19 cm AV Area (VTI):     2.65 cm AV Vmax:           167.00 cm/s AV Vmean:          110.500 cm/s AV VTI:            0.218 m AV Peak Grad:      11.2 mmHg AV Mean Grad:      6.0 mmHg LVOT Vmax:         127.00 cm/s LVOT Vmean:        77.100 cm/s LVOT VTI:          0.184 m LVOT/AV VTI ratio: 0.84  AORTA Ao Root diam: 3.10 cm Ao Asc diam:  3.00 cm MITRAL VALVE                  TRICUSPID VALVE MV Area (PHT): 3.92 cm       TR Peak grad:   28.9 mmHg MV Area VTI:   2.23 cm       TR Vmax:        269.00 cm/s MV Peak grad:  6.8 mmHg MV Mean grad:  3.5 mmHg       SHUNTS MV Vmax:       1.30 m/s       Systemic VTI:  0.18 m MV Vmean:  88.8 cm/s      Systemic Diam: 2.00 cm MV Decel Time: 194 msec MR Peak grad:    102.8 mmHg MR Mean grad:    57.0 mmHg MR Vmax:         507.00 cm/s MR Vmean:        341.0 cm/s MR PISA:         0.57 cm MR PISA Eff ROA: 4 mm MR PISA Radius:  0.30 cm MV E velocity: 131.00 cm/s Jenkins Rouge MD Electronically signed by Jenkins Rouge MD Signature Date/Time: 06/28/2021/12:17:05 PM    Final     Microbiology: Results for orders placed or performed during the hospital encounter of 06/27/21  Resp  Panel by RT-PCR (Flu A&B, Covid) Nasopharyngeal Swab     Status: None   Collection Time: 06/27/21  4:31 PM   Specimen: Nasopharyngeal Swab; Nasopharyngeal(NP) swabs in vial transport medium  Result Value Ref Range Status   SARS Coronavirus 2 by RT PCR NEGATIVE NEGATIVE Final    Comment: (NOTE) SARS-CoV-2 target nucleic acids are NOT DETECTED.  The SARS-CoV-2 RNA is generally detectable in upper respiratory specimens during the acute phase of infection. The lowest concentration of SARS-CoV-2 viral copies this assay can detect is 138 copies/mL. A negative result does not preclude SARS-Cov-2 infection and should not be used as the sole basis for treatment or other patient management decisions. A negative result may occur with  improper specimen collection/handling, submission of specimen other than nasopharyngeal swab, presence of viral mutation(s) within the areas targeted by this assay, and inadequate number of viral copies(<138 copies/mL). A negative result must be combined with clinical observations, patient history, and epidemiological information. The expected result is Negative.  Fact Sheet for Patients:  EntrepreneurPulse.com.au  Fact Sheet for Healthcare Providers:  IncredibleEmployment.be  This test is no t yet approved or cleared by the [place] FDA and  has been authorized for detection and/or diagnosis of SARS-CoV-2 by FDA under an Emergency Use Authorization (EUA). This EUA will remain  in effect (meaning this test can be used) for the duration of the COVID-19 declaration under Section 564(b)(1) of the Act, 21 U.S.C.section 360bbb-3(b)(1), unless the authorization is terminated  or revoked sooner.       Influenza A by PCR NEGATIVE NEGATIVE Final   Influenza B by PCR NEGATIVE NEGATIVE Final    Comment: (NOTE) The Xpert Xpress SARS-CoV-2/FLU/RSV plus assay is intended as an aid in the diagnosis of influenza from Nasopharyngeal  swab specimens and should not be used as a sole basis for treatment. Nasal washings and aspirates are unacceptable for Xpert Xpress SARS-CoV-2/FLU/RSV testing.  Fact Sheet for Patients: EntrepreneurPulse.com.au  Fact Sheet for Healthcare Providers: IncredibleEmployment.be  This test is not yet approved or cleared by the [place] FDA and has been authorized for detection and/or diagnosis of SARS-CoV-2 by FDA under an Emergency Use Authorization (EUA). This EUA will remain in effect (meaning this test can be used) for the duration of the COVID-19 declaration under Section 564(b)(1) of the Act, 21 U.S.C. section 360bbb-3(b)(1), unless the authorization is terminated or revoked.  Performed at [facility], [place] [address] [place], [place] [address]   Resp Panel by RT-PCR (Flu A&B, Covid) Nasopharyngeal Swab     Status: None   Collection Time: 07/01/21 10:21 AM   Specimen: Nasopharyngeal Swab; Nasopharyngeal(NP) swabs in vial transport medium  Result Value Ref Range Status   SARS Coronavirus 2 by RT PCR NEGATIVE NEGATIVE Final    Comment: (  NOTE) SARS-CoV-2 target nucleic acids are NOT DETECTED.  The SARS-CoV-2 RNA is generally detectable in upper respiratory specimens during the acute phase of infection. The lowest concentration of SARS-CoV-2 viral copies this assay can detect is 138 copies/mL. A negative result does not preclude SARS-Cov-2 infection and should not be used as the sole basis for treatment or other patient management decisions. A negative result may occur with  improper specimen collection/handling, submission of specimen other than nasopharyngeal swab, presence of viral mutation(s) within the areas targeted by this assay, and inadequate number of viral copies(<138 copies/mL). A negative result must be combined with clinical observations, patient history, and epidemiological information. The expected result is  Negative.  Fact Sheet for Patients:  EntrepreneurPulse.com.au  Fact Sheet for Healthcare Providers:  IncredibleEmployment.be  This test is no t yet approved or cleared by the [place] FDA and  has been authorized for detection and/or diagnosis of SARS-CoV-2 by FDA under an Emergency Use Authorization (EUA). This EUA will remain  in effect (meaning this test can be used) for the duration of the COVID-19 declaration under Section 564(b)(1) of the Act, 21 U.S.C.section 360bbb-3(b)(1), unless the authorization is terminated  or revoked sooner.       Influenza A by PCR NEGATIVE NEGATIVE Final   Influenza B by PCR NEGATIVE NEGATIVE Final    Comment: (NOTE) The Xpert Xpress SARS-CoV-2/FLU/RSV plus assay is intended as an aid in the diagnosis of influenza from Nasopharyngeal swab specimens and should not be used as a sole basis for treatment. Nasal washings and aspirates are unacceptable for Xpert Xpress SARS-CoV-2/FLU/RSV testing.  Fact Sheet for Patients: EntrepreneurPulse.com.au  Fact Sheet for Healthcare Providers: IncredibleEmployment.be  This test is not yet approved or cleared by the [place] FDA and has been authorized for detection and/or diagnosis of SARS-CoV-2 by FDA under an Emergency Use Authorization (EUA). This EUA will remain in effect (meaning this test can be used) for the duration of the COVID-19 declaration under Section 564(b)(1) of the Act, 21 U.S.C. section 360bbb-3(b)(1), unless the authorization is terminated or revoked.  Performed at [facility], [place] [address], [place], [place] [address]   Resp Panel by RT-PCR (Flu A&B, Covid) Nasopharyngeal Swab     Status: None   Collection Time: 07/07/21 11:50 AM   Specimen: Nasopharyngeal Swab; Nasopharyngeal(NP) swabs in vial transport medium  Result Value Ref Range Status   SARS Coronavirus 2 by RT PCR NEGATIVE NEGATIVE Final     Comment: (NOTE) SARS-CoV-2 target nucleic acids are NOT DETECTED.  The SARS-CoV-2 RNA is generally detectable in upper respiratory specimens during the acute phase of infection. The lowest concentration of SARS-CoV-2 viral copies this assay can detect is 138 copies/mL. A negative result does not preclude SARS-Cov-2 infection and should not be used as the sole basis for treatment or other patient management decisions. A negative result may occur with  improper specimen collection/handling, submission of specimen other than nasopharyngeal swab, presence of viral mutation(s) within the areas targeted by this assay, and inadequate number of viral copies(<138 copies/mL). A negative result must be combined with clinical observations, patient history, and epidemiological information. The expected result is Negative.  Fact Sheet for Patients:  EntrepreneurPulse.com.au  Fact Sheet for Healthcare Providers:  IncredibleEmployment.be  This test is no t yet approved or cleared by the [place] FDA and  has been authorized for detection and/or diagnosis of SARS-CoV-2 by FDA under an Emergency Use Authorization (EUA). This EUA will remain  in effect (meaning  this test can be used) for the duration of the COVID-19 declaration under Section 564(b)(1) of the Act, 21 U.S.C.section 360bbb-3(b)(1), unless the authorization is terminated  or revoked sooner.       Influenza A by PCR NEGATIVE NEGATIVE Final   Influenza B by PCR NEGATIVE NEGATIVE Final    Comment: (NOTE) The Xpert Xpress SARS-CoV-2/FLU/RSV plus assay is intended as an aid in the diagnosis of influenza from Nasopharyngeal swab specimens and should not be used as a sole basis for treatment. Nasal washings and aspirates are unacceptable for Xpert Xpress SARS-CoV-2/FLU/RSV testing.  Fact Sheet for Patients: EntrepreneurPulse.com.au  Fact Sheet for Healthcare  Providers: IncredibleEmployment.be  This test is not yet approved or cleared by the [place] FDA and has been authorized for detection and/or diagnosis of SARS-CoV-2 by FDA under an Emergency Use Authorization (EUA). This EUA will remain in effect (meaning this test can be used) for the duration of the COVID-19 declaration under Section 564(b)(1) of the Act, 21 U.S.C. section 360bbb-3(b)(1), unless the authorization is terminated or revoked.  Performed at [facility], [place] [address], [place], [place] [address]    *Note: Due to a large number of results and/or encounters for the requested time period, some results have not been displayed. A complete set of results can be found in Results Review.   Labs: CBC: Recent Labs  Lab 07/03/21 1042 07/04/21 0417 07/05/21 0216 07/05/21 1108 07/05/21 1109 07/06/21 0250 07/07/21 0306  WBC 6.9 5.8 5.2  --   --  7.6 7.0  NEUTROABS 5.1 3.5 2.9  --   --  4.9 4.0  HGB 10.7* 10.0* 9.9* 10.9* 10.9* 10.0* 10.5*  HCT 34.2* 32.0* 32.0* 32.0* 32.0* 31.7* 33.5*  MCV 94.0 95.5 95.0  --   --  93.5 94.9  PLT 300 250 248  --   --  258 476   Basic Metabolic Panel: Recent Labs  Lab 07/03/21 0501 07/04/21 0417 07/05/21 0216 07/05/21 1108 07/05/21 1109 07/06/21 0250 07/07/21 0306  NA 140 141 140 143 142 139 141  K 4.3 4.4 3.9 4.0 4.1 3.9 3.8  CL 105 104 105  --   --  104 103  CO2 26 29 27   --   --  25 25  GLUCOSE 103* 136* 138*  --   --  166* 121*  BUN 32* 32* 34*  --   --  31* 31*  CREATININE 1.30* 1.36* 1.45*  --   --  1.53* 1.52*  CALCIUM 9.5 9.5 9.3  --   --  9.5 9.5  MG 1.8 2.0 1.8  --   --  1.8 2.0  PHOS  --  4.1 4.1  --   --  3.5 4.0   Liver Function Tests: Recent Labs  Lab 07/01/21 0645 07/04/21 0417 07/05/21 0216 07/06/21 0250 07/07/21 0306  AST 20 16 18 18 18   ALT 13 11 11 12 12   ALKPHOS 30* 25* 29* 29* 27*  BILITOT 0.6 0.5 0.6 0.6 0.5  PROT 7.1 6.3* 6.5 6.8 7.1  ALBUMIN 4.0 3.5 3.5 3.8 3.8    CBG: No results for input(s): GLUCAP in the last 168 hours.  Discharge time spent: greater than 30 minutes.  Signed: Kerney Elbe, DO [facility] 07/07/2021

## 2021-07-08 ENCOUNTER — Encounter (HOSPITAL_COMMUNITY): Payer: HMO

## 2021-07-08 ENCOUNTER — Telehealth: Payer: Self-pay

## 2021-07-08 NOTE — Telephone Encounter (Signed)
Attempted TCM call. Patient's daughter says patient is in a skilled nursing facility.

## 2021-07-09 DIAGNOSIS — D638 Anemia in other chronic diseases classified elsewhere: Secondary | ICD-10-CM | POA: Diagnosis not present

## 2021-07-09 DIAGNOSIS — N1831 Chronic kidney disease, stage 3a: Secondary | ICD-10-CM | POA: Diagnosis not present

## 2021-07-09 DIAGNOSIS — I272 Pulmonary hypertension, unspecified: Secondary | ICD-10-CM | POA: Diagnosis not present

## 2021-07-09 DIAGNOSIS — Z952 Presence of prosthetic heart valve: Secondary | ICD-10-CM | POA: Diagnosis not present

## 2021-07-09 DIAGNOSIS — Z7901 Long term (current) use of anticoagulants: Secondary | ICD-10-CM | POA: Diagnosis not present

## 2021-07-09 DIAGNOSIS — K219 Gastro-esophageal reflux disease without esophagitis: Secondary | ICD-10-CM | POA: Diagnosis not present

## 2021-07-09 DIAGNOSIS — E039 Hypothyroidism, unspecified: Secondary | ICD-10-CM | POA: Diagnosis not present

## 2021-07-09 DIAGNOSIS — I4891 Unspecified atrial fibrillation: Secondary | ICD-10-CM | POA: Diagnosis not present

## 2021-07-09 DIAGNOSIS — I5041 Acute combined systolic (congestive) and diastolic (congestive) heart failure: Secondary | ICD-10-CM | POA: Diagnosis not present

## 2021-07-09 DIAGNOSIS — I480 Paroxysmal atrial fibrillation: Secondary | ICD-10-CM | POA: Diagnosis not present

## 2021-07-09 DIAGNOSIS — J9621 Acute and chronic respiratory failure with hypoxia: Secondary | ICD-10-CM | POA: Diagnosis not present

## 2021-07-09 DIAGNOSIS — R41841 Cognitive communication deficit: Secondary | ICD-10-CM | POA: Diagnosis not present

## 2021-07-09 DIAGNOSIS — M6281 Muscle weakness (generalized): Secondary | ICD-10-CM | POA: Diagnosis not present

## 2021-07-09 DIAGNOSIS — I150 Renovascular hypertension: Secondary | ICD-10-CM | POA: Diagnosis not present

## 2021-07-09 DIAGNOSIS — R262 Difficulty in walking, not elsewhere classified: Secondary | ICD-10-CM | POA: Diagnosis not present

## 2021-07-10 ENCOUNTER — Encounter (HOSPITAL_COMMUNITY): Payer: HMO

## 2021-07-12 DIAGNOSIS — N390 Urinary tract infection, site not specified: Secondary | ICD-10-CM | POA: Diagnosis not present

## 2021-07-15 ENCOUNTER — Encounter (HOSPITAL_COMMUNITY): Payer: HMO

## 2021-07-15 DIAGNOSIS — N390 Urinary tract infection, site not specified: Secondary | ICD-10-CM | POA: Diagnosis not present

## 2021-07-16 DIAGNOSIS — Z5181 Encounter for therapeutic drug level monitoring: Secondary | ICD-10-CM | POA: Diagnosis not present

## 2021-07-16 DIAGNOSIS — Z952 Presence of prosthetic heart valve: Secondary | ICD-10-CM | POA: Diagnosis not present

## 2021-07-16 DIAGNOSIS — I4891 Unspecified atrial fibrillation: Secondary | ICD-10-CM | POA: Diagnosis not present

## 2021-07-16 NOTE — Progress Notes (Signed)
HEART & VASCULAR TRANSITION OF CARE CONSULT NOTE     Referring Physician:Dr Nahser Primary Care: Dr Charlett Blake  Primary Cardiologist:Dr Tamala Julian   HPI: Referred to clinic by Dr Acie Fredrickson for heart failure consultation.   [name] is a 71 y.o. female with a hx of bicuspid AV/AS with ascending aortic aneurysm s/p resection/grafting of ascending aortic aneurysm and placement of St. Jude mechanical AV conduit/[place] 2009, chronic atrial fib, bradycardia s/p [employer]  PPM implantation 12/2019, CVAs, multiple falls with prior craniotomy for subdural hematoma/subarachnoid hemorrhage, prior chronic infarcts on MRI 2021, bipolar disorder, anemia, depression, debilitation (wheelchair bound), DM, HLD, HTN, mild carotid artery disease 2021, back pain, obesity, memory impairment, and chronic HFrEF.   Admitted 06/2020 with increased shortness of breath in the setting of acute HFrEF. Diuresed with IV lasix. Echo repeated EF 25-30%. Had cath with patent coronaries. Started on [place]. Discharged on [facility].   Presents today from SNF with her daughter. Arrived in a wheelchair. No falls since discharge. Overall feeling fine. Denies SOB/PND/Orthopnea. Requires assistance with ambulation. Walks with a walker but needs stand by assist. Appetite ok. No fever or chills. No recent weights. Medications provided at [facility]. Remains at [facility]. Her daughter tells me that she will be at  [facility] long term.    Cardiac Testing  [facility] LHC 06/2021-Patent coronaries. Elevated filling pressures. CO 7.3 CI 3.5    06/2021 Echo  EF 25 to 30%. . Pacing wires in RV/RA. RV normal  LA mod dilated. MV degenerative. Mild MVR. ricuspid valve regurgitation is mild to moderate.  Post AVR with 25 mm St Jude trivial closing volume central AR low gradients normal function . The aortic valve has been repaired/replaced. Aortic valve regurgitation is trivial. No aortic stenosis is present. There is a 25 mm St.  Jude mechanical valve present in the aortic position.   Echo 03/2021 EF 40-45%  Echo 2021 EF 50-55%   Review of Systems: [y] = yes, [ ]  = no   General: Weight gain [ ] ; Weight loss [ ] ; Anorexia [ ] ; Fatigue [Y ]; Fever [ ] ; Chills [ ] ; Weakness [ ]   Cardiac: Chest pain/pressure [ ] ; Resting SOB [ ] ; Exertional SOB [ ] ; Orthopnea [ ] ; Pedal Edema [ ] ; Palpitations [ ] ; Syncope [ ] ; Presyncope [ ] ; Paroxysmal nocturnal dyspnea[ ]   Pulmonary: Cough [ ] ; Wheezing[ ] ; Hemoptysis[ ] ; Sputum [ ] ; Snoring [ ]   GI: Vomiting[ ] ; Dysphagia[ ] ; Melena[ ] ; Hematochezia [ ] ; Heartburn[ ] ; Abdominal pain [ ] ; Constipation [ ] ; Diarrhea [ ] ; BRBPR [ ]   GU: Hematuria[ ] ; Dysuria [ ] ; Nocturia[ ]   Vascular: Pain in legs with walking [ ] ; Pain in feet with lying flat [ ] ; Non-healing sores [ ] ; Stroke [Y ]; TIA [ ] ; Slurred speech [ ] ;  Neuro: Headaches[ ] ; Vertigo[ ] ; Seizures[ ] ; Paresthesias[ ] ;Blurred vision [ ] ; Diplopia [ ] ; Vision changes [ ]   Ortho/Skin: Arthritis [ ] ; Joint pain [Y ]; Muscle pain [ ] ; Joint swelling [ ] ; Back Pain [Y ]; Rash [ ]   Psych: Depression[ ] ; Anxiety[ ]   Heme: Bleeding problems [ ] ; Clotting disorders [ ] ; Anemia [ Y]  Endocrine: Diabetes [ ] ; Thyroid dysfunction[ Y]   Past Medical History:  Diagnosis Date   ANEMIA 05/28/2010   AORTIC VALVE REPLACEMENT, HX OF 03/11/2010   Mechanical prosthesis   Ascending aortic aneurysm    Atrial fibrillation (HCC)  Atrial flutter ([place])    BIPOLAR AFFECTIVE DISORDER 03/11/2010   Bradycardia    Chronic diastolic CHF (congestive heart failure) ([place])    Depression    Diabetes ([place])    Diverticulitis 12/18/2010   Falls    FIBROIDS, UTERUS 03/11/2010   Gout 09/04/2016   Grave's disease 12/2010   Hyperlipidemia associated with type 2 diabetes mellitus ([place]) 06/05/2016   Hypertension    Low back pain 11/02/2017   Migraine 06/05/2016   Mixed hyperlipidemia 03/11/2010   Obesity    Pacemaker    Prolonged QT interval    SAH  (subarachnoid hemorrhage) (HCC)    Skin cancer    Subdural hematoma    Syncope 08/22/2019   UTI (lower urinary tract infection) 08/14/2011    Current Outpatient Medications  Medication Sig Dispense Refill   acetaminophen (TYLENOL) 325 MG tablet Take 325 mg by mouth every 4 (four) hours as needed for mild pain.     albuterol (VENTOLIN HFA) 108 (90 Base) MCG/ACT inhaler Inhale 2 puffs into the lungs at bedtime.     carvedilol (COREG) 12.5 MG tablet Take 1 tablet (12.5 mg total) by mouth 2 (two) times daily with a meal.     cholecalciferol (VITAMIN D) 25 MCG tablet Take 1 tablet (1,000 Units total) by mouth daily.     dapagliflozin propanediol (FARXIGA) 10 MG TABS tablet Take 1 tablet (10 mg total) by mouth daily. 30 tablet 0   fenofibrate 160 MG tablet Take 1 tablet (160 mg total) by mouth daily.     fexofenadine (ALLEGRA) 60 MG tablet Take 60 mg by mouth daily.     furosemide (LASIX) 40 MG tablet Take 1 tablet (40 mg total) by mouth daily. 90 tablet 3   lamoTRIgine (LAMICTAL) 100 MG tablet Take 100 mg by mouth daily.     lamoTRIgine (LAMICTAL) 200 MG tablet Take 200 mg by mouth at bedtime.     levothyroxine (SYNTHROID) 25 MCG tablet TAKE 1 TABLET BY MOUTH DAILY BEFORE BREAKFAST. 90 tablet 0   lip balm (CARMEX) ointment Apply topically as needed for lip care. 7 g 0   Melatonin 10 MG TABS Take 10 mg by mouth at bedtime.     Miconazole POWD Apply 1 application topically 4 (four) times daily as needed (vaginal itching and odor). Patient may keep at bedside     Multiple Vitamin (MULTIVITAMIN) tablet Take 1 tablet by mouth daily.     ondansetron (ZOFRAN) 4 MG tablet Take 1 tablet (4 mg total) by mouth every 6 (six) hours as needed for nausea. 20 tablet 0   pantoprazole (PROTONIX) 40 MG tablet Take 1 tablet (40 mg total) by mouth daily.     potassium chloride SA (KLOR-CON M) 20 MEQ tablet Take 1 tablet (20 mEq total) by mouth daily.     rosuvastatin (CRESTOR) 40 MG tablet Take 40 mg by mouth  daily.     sacubitril-valsartan (ENTRESTO) 24-26 MG Take 1 tablet by mouth 2 (two) times daily. 60 tablet 0   traZODone (DESYREL) 50 MG tablet Take 100 mg by mouth at bedtime.     venlafaxine (EFFEXOR) 75 MG tablet Take 75 mg by mouth daily in the afternoon.     vitamin B-12 (CYANOCOBALAMIN) 1000 MCG tablet Take 1,000 mcg by mouth daily.     warfarin (COUMADIN) 1 MG tablet Take 1 tablet by mouth daily. Give with 2.5 mg tab. (Total 3.5 mg daily)     warfarin (COUMADIN) 2.5 MG tablet Take 2.5 mg  by mouth daily. Give with 1 mg tab = 3.5 mg total     venlafaxine XR (EFFEXOR XR) 75 MG 24 hr capsule Take 1 capsule (75 mg total) by mouth daily with breakfast. 90 capsule 1   No current facility-administered medications for this encounter.    Allergies  Allergen Reactions   Mucinex [Guaifenesin Er] Other (See Comments)    Severe headaches    Keflex [Cephalexin] Other (See Comments)    Headaches and dizziness   Keflex [Cephalexin] Other (See Comments)    Headaches and/or dizziness- "allergic," per MAR   Mucinex [Guaifenesin Er] Other (See Comments)    Severe headaches- "allergic," per MAR   Sulfa Antibiotics Other (See Comments)    headaches   Sulfa Antibiotics Other (See Comments)    Headaches- "allergic," per MAR    Sulfonamide Derivatives Other (See Comments)    Headaches       Social History   Socioeconomic History   Marital status: Married    Spouse name: Not on file   Number of children: Not on file   Years of education: Not on file   Highest education level: Not on file  Occupational History   Not on file  Tobacco Use   Smoking status: Former    Types: Cigarettes    Quit date: 05/26/1990    Years since quitting: 31.1   Smokeless tobacco: Never  Vaping Use   Vaping Use: Never used  Substance and Sexual Activity   Alcohol use: Yes    Alcohol/week: 0.0 standard drinks    Comment: rare   Drug use: No   Sexual activity: Not on file  Other Topics Concern   Not on file   Social History Narrative   ** Merged History Encounter **       Social Determinants of Health   Financial Resource Strain: Not on file  Food Insecurity: Not on file  Transportation Needs: Not on file  Physical Activity: Not on file  Stress: Not on file  Social Connections: Not on file  Intimate Partner Violence: Not on file      Family History  Problem Relation Age of Onset   Scoliosis Mother    Arthritis Mother        Rheumatoid   Aneurysm Mother        heart   Alcohol abuse Father    Depression Sister    Depression Brother    Alcohol abuse Brother    Cancer Maternal Grandmother        colon   Diabetes Maternal Grandfather    Heart disease Maternal Grandfather    Alcohol abuse Maternal Grandfather    Depression Paternal Grandmother    Diabetes Paternal Grandmother    Depression Paternal Grandfather    Diabetes Paternal Grandfather    Depression Sister    Alcohol abuse Brother    Depression Brother    Heart disease Brother     Vitals:   07/17/21 0919  BP: 100/60  Pulse: 60  SpO2: 96%  Weight: 96 kg (211 lb 9.6 oz)   Wt Readings from Last 3 Encounters:  07/17/21 96 kg (211 lb 9.6 oz)  07/07/21 93 kg (205 lb 1.6 oz)  06/24/21 95.3 kg (210 lb)    PHYSICAL EXAM: General:  Arrived in a wheelchair. No respiratory difficulty HEENT: normal Neck: supple. no JVD. Carotids 2+ bilat; no bruits. No lymphadenopathy or thryomegaly appreciated. Cor: PMI nondisplaced. Irregular rate & rhythm. No rubs, gallops. Mechanical S2. Lungs: clear Abdomen:  soft, nontender, nondistended. No hepatosplenomegaly. No bruits or masses. Good bowel sounds. Extremities: no cyanosis, clubbing, rash, trace edema Neuro: alert & oriented x 3, cranial nerves grossly intact. moves all 4 extremities w/o difficulty. Affect pleasant.  UXL:KGMW  62 bpm personally checked    ASSESSMENT & PLAN: Chronic HFrEF, NICM  -Cath 06/2020 patent coronaries.  - 06/2020 Echo EF down 25-30% previous Echo  03/2021 November EF 40-45%. -Functional class difficult to assess due to limited mobility. Suspect NYHA III.  - Volume status stable Continue lasix 40 mg po daily  -Continue carvedilol 12.5 mg twice a day  - Continue entresto 24-26 mg twice a day  - Add 12.5 mg spironolactone daily  - Continue farxiga 10 mg daily  Check BMET today and in 7 days.  - Plan to optimize HF meds int eh   2. CKD Stage IIIa Check BMET   3. Hypothyroid  On levothyroxine.   4. A fib, Chronic  -EKG -Rate controlled in A fib.  -Continue current dose of carvedilol  - On coumadin  5. Mechanical Aortic Valve On coumadin.   6. Tachy/Brady Syndrome --> PPM   7. Falls  H/O frequent falls. Needs assistance when walking.   8. Memory Impairment  Check BMET and CBC. Check EKG    Referred to HFSW (PCP, Medications, Transportation, ETOH Abuse, Drug Abuse, Insurance, Financial ): No  Refer to Pharmacy: r No Refer to Home Health:  No Refer to Advanced Heart Failure Clinic: Yes -->Dr Aundra Dubin  Refer to General Cardiology: Shared   Follow up  with Dr Aundra Dubin in 4 weeks.

## 2021-07-17 ENCOUNTER — Encounter (HOSPITAL_COMMUNITY): Payer: Self-pay

## 2021-07-17 ENCOUNTER — Ambulatory Visit (HOSPITAL_COMMUNITY)
Admission: RE | Admit: 2021-07-17 | Discharge: 2021-07-17 | Disposition: A | Discharge status: Home / Self Care | Payer: HMO | Source: Ambulatory Visit | Attending: Adult Health | Admitting: Adult Health

## 2021-07-17 ENCOUNTER — Other Ambulatory Visit: Payer: Self-pay

## 2021-07-17 VITALS — BP 100/60 | HR 60 | Wt 211.6 lb

## 2021-07-17 DIAGNOSIS — I509 Heart failure, unspecified: Secondary | ICD-10-CM

## 2021-07-17 DIAGNOSIS — E039 Hypothyroidism, unspecified: Secondary | ICD-10-CM | POA: Diagnosis not present

## 2021-07-17 DIAGNOSIS — N1831 Chronic kidney disease, stage 3a: Secondary | ICD-10-CM | POA: Diagnosis not present

## 2021-07-17 DIAGNOSIS — R296 Repeated falls: Secondary | ICD-10-CM | POA: Diagnosis not present

## 2021-07-17 DIAGNOSIS — Z993 Dependence on wheelchair: Secondary | ICD-10-CM | POA: Diagnosis not present

## 2021-07-17 DIAGNOSIS — I495 Sick sinus syndrome: Secondary | ICD-10-CM | POA: Insufficient documentation

## 2021-07-17 DIAGNOSIS — E669 Obesity, unspecified: Secondary | ICD-10-CM | POA: Insufficient documentation

## 2021-07-17 DIAGNOSIS — I351 Nonrheumatic aortic (valve) insufficiency: Secondary | ICD-10-CM | POA: Insufficient documentation

## 2021-07-17 DIAGNOSIS — Z8679 Personal history of other diseases of the circulatory system: Secondary | ICD-10-CM | POA: Insufficient documentation

## 2021-07-17 DIAGNOSIS — I428 Other cardiomyopathies: Secondary | ICD-10-CM | POA: Insufficient documentation

## 2021-07-17 DIAGNOSIS — I130 Hypertensive heart and chronic kidney disease with heart failure and stage 1 through stage 4 chronic kidney disease, or unspecified chronic kidney disease: Secondary | ICD-10-CM | POA: Diagnosis not present

## 2021-07-17 DIAGNOSIS — I482 Chronic atrial fibrillation, unspecified: Secondary | ICD-10-CM | POA: Diagnosis not present

## 2021-07-17 DIAGNOSIS — I5042 Chronic combined systolic (congestive) and diastolic (congestive) heart failure: Secondary | ICD-10-CM | POA: Insufficient documentation

## 2021-07-17 DIAGNOSIS — Z9181 History of falling: Secondary | ICD-10-CM | POA: Insufficient documentation

## 2021-07-17 DIAGNOSIS — F319 Bipolar disorder, unspecified: Secondary | ICD-10-CM | POA: Insufficient documentation

## 2021-07-17 DIAGNOSIS — Z79899 Other long term (current) drug therapy: Secondary | ICD-10-CM | POA: Insufficient documentation

## 2021-07-17 DIAGNOSIS — Z7984 Long term (current) use of oral hypoglycemic drugs: Secondary | ICD-10-CM | POA: Diagnosis not present

## 2021-07-17 DIAGNOSIS — G3184 Mild cognitive impairment, so stated: Secondary | ICD-10-CM | POA: Diagnosis not present

## 2021-07-17 DIAGNOSIS — D649 Anemia, unspecified: Secondary | ICD-10-CM | POA: Diagnosis not present

## 2021-07-17 DIAGNOSIS — Z7901 Long term (current) use of anticoagulants: Secondary | ICD-10-CM | POA: Diagnosis not present

## 2021-07-17 DIAGNOSIS — E785 Hyperlipidemia, unspecified: Secondary | ICD-10-CM | POA: Insufficient documentation

## 2021-07-17 DIAGNOSIS — E1122 Type 2 diabetes mellitus with diabetic chronic kidney disease: Secondary | ICD-10-CM | POA: Diagnosis not present

## 2021-07-17 DIAGNOSIS — Z8673 Personal history of transient ischemic attack (TIA), and cerebral infarction without residual deficits: Secondary | ICD-10-CM | POA: Insufficient documentation

## 2021-07-17 DIAGNOSIS — Z952 Presence of prosthetic heart valve: Secondary | ICD-10-CM | POA: Diagnosis not present

## 2021-07-17 LAB — BASIC METABOLIC PANEL
Anion gap: 9 (ref 5–15)
BUN: 26 mg/dL — ABNORMAL HIGH (ref 8–23)
CO2: 30 mmol/L (ref 22–32)
Calcium: 9.3 mg/dL (ref 8.9–10.3)
Chloride: 101 mmol/L (ref 98–111)
Creatinine, Ser: 1.68 mg/dL — ABNORMAL HIGH (ref 0.44–1.00)
GFR, Estimated: 33 mL/min — ABNORMAL LOW (ref >60)
Glucose, Bld: 126 mg/dL — ABNORMAL HIGH (ref 70–99)
Potassium: 4.1 mmol/L (ref 3.5–5.1)
Sodium: 140 mmol/L (ref 135–145)

## 2021-07-17 LAB — CBC
HCT: 34.9 % — ABNORMAL LOW (ref 36.0–46.0)
Hemoglobin: 10.8 g/dL — ABNORMAL LOW (ref 12.0–15.0)
MCH: 29.1 pg (ref 26.0–34.0)
MCHC: 30.9 g/dL (ref 30.0–36.0)
MCV: 94.1 fL (ref 80.0–100.0)
Platelets: 284 10*3/uL (ref 150–400)
RBC: 3.71 MIL/uL — ABNORMAL LOW (ref 3.87–5.11)
RDW: 14 % (ref 11.5–15.5)
WBC: 5.8 10*3/uL (ref 4.0–10.5)
nRBC: 0 % (ref 0.0–0.2)

## 2021-07-17 MED ORDER — SPIRONOLACTONE 25 MG PO TABS: 12.5000 mg | ORAL_TABLET | Freq: Every day | ORAL | 3 refills | Status: DC

## 2021-07-17 MED ORDER — SPIRONOLACTONE 25 MG PO TABS: 12.5000 mg | ORAL_TABLET | Freq: Every day | ORAL | 0 refills | Status: DC

## 2021-07-17 MED ORDER — SPIRONOLACTONE 25 MG PO TABS
12.5000 mg | ORAL_TABLET | Freq: Every day | ORAL | 0 refills | Status: DC
Start: 2021-07-17 — End: 2021-07-17

## 2021-07-17 NOTE — Patient Instructions (Addendum)
Good to see you today  START Spironolactone 12.5 mg ( 1/2 tablet) daily  Labs done today, your results will be available in MyChart, we will contact you for abnormal readings.  Follow up lab work in 10 days and fax results to clinic  Your physician recommends that you schedule a follow-up appointment in: Wednesday  march 22,2023 2:40 pm  If you have any questions or concerns before your next appointment please send [place] a message through [place] or call our office at [phone].    TO LEAVE A MESSAGE FOR THE NURSE SELECT OPTION 2, PLEASE LEAVE A MESSAGE INCLUDING: YOUR NAME DATE OF BIRTH CALL BACK NUMBER REASON FOR CALL**this is important as we prioritize the call backs  YOU WILL RECEIVE A CALL BACK THE SAME DAY AS LONG AS YOU CALL BEFORE 4:00 PM  At the [facility], you and your health needs are our priority. As part of our continuing mission to provide you with exceptional heart care, we have created designated Provider Care Teams. These Care Teams include your primary Cardiologist (physician) and Advanced Practice Providers (APPs- Physician Assistants and Nurse Practitioners) who all work together to provide you with the care you need, when you need it.   You may see any of the following providers on your designated Care Team at your next follow up: Dr Glori Bickers Dr Haynes Kerns, NP Lyda Jester, [place] [facility] [place], [place] Audry Riles, PharmD   Please be sure to bring in all your medications bottles to every appointment.

## 2021-07-19 DIAGNOSIS — M2391 Unspecified internal derangement of right knee: Secondary | ICD-10-CM | POA: Diagnosis not present

## 2021-07-24 DIAGNOSIS — I10 Essential (primary) hypertension: Secondary | ICD-10-CM | POA: Diagnosis not present

## 2021-07-24 DIAGNOSIS — R41841 Cognitive communication deficit: Secondary | ICD-10-CM | POA: Diagnosis not present

## 2021-07-24 DIAGNOSIS — F319 Bipolar disorder, unspecified: Secondary | ICD-10-CM | POA: Diagnosis not present

## 2021-07-24 DIAGNOSIS — I480 Paroxysmal atrial fibrillation: Secondary | ICD-10-CM | POA: Diagnosis not present

## 2021-07-24 DIAGNOSIS — M1990 Unspecified osteoarthritis, unspecified site: Secondary | ICD-10-CM | POA: Diagnosis not present

## 2021-07-24 DIAGNOSIS — I4891 Unspecified atrial fibrillation: Secondary | ICD-10-CM | POA: Diagnosis not present

## 2021-07-24 DIAGNOSIS — R296 Repeated falls: Secondary | ICD-10-CM | POA: Diagnosis not present

## 2021-07-24 DIAGNOSIS — M25561 Pain in right knee: Secondary | ICD-10-CM | POA: Diagnosis not present

## 2021-07-24 DIAGNOSIS — R262 Difficulty in walking, not elsewhere classified: Secondary | ICD-10-CM | POA: Diagnosis not present

## 2021-07-24 DIAGNOSIS — R2681 Unsteadiness on feet: Secondary | ICD-10-CM | POA: Diagnosis not present

## 2021-07-24 DIAGNOSIS — M6281 Muscle weakness (generalized): Secondary | ICD-10-CM | POA: Diagnosis not present

## 2021-07-24 DIAGNOSIS — I509 Heart failure, unspecified: Secondary | ICD-10-CM | POA: Diagnosis not present

## 2021-07-25 DIAGNOSIS — M25561 Pain in right knee: Secondary | ICD-10-CM | POA: Diagnosis not present

## 2021-07-25 DIAGNOSIS — F3132 Bipolar disorder, current episode depressed, moderate: Secondary | ICD-10-CM | POA: Diagnosis not present

## 2021-07-26 DIAGNOSIS — I4891 Unspecified atrial fibrillation: Secondary | ICD-10-CM | POA: Diagnosis not present

## 2021-07-26 DIAGNOSIS — Z952 Presence of prosthetic heart valve: Secondary | ICD-10-CM | POA: Diagnosis not present

## 2021-07-26 DIAGNOSIS — Z5181 Encounter for therapeutic drug level monitoring: Secondary | ICD-10-CM | POA: Diagnosis not present

## 2021-07-26 DIAGNOSIS — M25561 Pain in right knee: Secondary | ICD-10-CM | POA: Diagnosis not present

## 2021-07-31 DIAGNOSIS — H43392 Other vitreous opacities, left eye: Secondary | ICD-10-CM | POA: Diagnosis not present

## 2021-07-31 DIAGNOSIS — H25813 Combined forms of age-related cataract, bilateral: Secondary | ICD-10-CM | POA: Diagnosis not present

## 2021-07-31 DIAGNOSIS — H43823 Vitreomacular adhesion, bilateral: Secondary | ICD-10-CM | POA: Diagnosis not present

## 2021-07-31 DIAGNOSIS — H35372 Puckering of macula, left eye: Secondary | ICD-10-CM | POA: Diagnosis not present

## 2021-07-31 DIAGNOSIS — E119 Type 2 diabetes mellitus without complications: Secondary | ICD-10-CM | POA: Diagnosis not present

## 2021-07-31 DIAGNOSIS — H33192 Other retinoschisis and retinal cysts, left eye: Secondary | ICD-10-CM | POA: Diagnosis not present

## 2021-07-31 DIAGNOSIS — H35341 Macular cyst, hole, or pseudohole, right eye: Secondary | ICD-10-CM | POA: Diagnosis not present

## 2021-08-01 DIAGNOSIS — E1159 Type 2 diabetes mellitus with other circulatory complications: Secondary | ICD-10-CM | POA: Diagnosis not present

## 2021-08-01 DIAGNOSIS — E785 Hyperlipidemia, unspecified: Secondary | ICD-10-CM | POA: Diagnosis not present

## 2021-08-01 DIAGNOSIS — R2681 Unsteadiness on feet: Secondary | ICD-10-CM | POA: Diagnosis not present

## 2021-08-01 DIAGNOSIS — I509 Heart failure, unspecified: Secondary | ICD-10-CM | POA: Diagnosis not present

## 2021-08-01 DIAGNOSIS — F319 Bipolar disorder, unspecified: Secondary | ICD-10-CM | POA: Diagnosis not present

## 2021-08-01 DIAGNOSIS — K219 Gastro-esophageal reflux disease without esophagitis: Secondary | ICD-10-CM | POA: Diagnosis not present

## 2021-08-01 DIAGNOSIS — N1831 Chronic kidney disease, stage 3a: Secondary | ICD-10-CM | POA: Diagnosis not present

## 2021-08-01 DIAGNOSIS — M25561 Pain in right knee: Secondary | ICD-10-CM | POA: Diagnosis not present

## 2021-08-01 DIAGNOSIS — E039 Hypothyroidism, unspecified: Secondary | ICD-10-CM | POA: Diagnosis not present

## 2021-08-01 DIAGNOSIS — I4891 Unspecified atrial fibrillation: Secondary | ICD-10-CM | POA: Diagnosis not present

## 2021-08-01 DIAGNOSIS — Z7901 Long term (current) use of anticoagulants: Secondary | ICD-10-CM | POA: Diagnosis not present

## 2021-08-01 DIAGNOSIS — R791 Abnormal coagulation profile: Secondary | ICD-10-CM | POA: Diagnosis not present

## 2021-08-02 DIAGNOSIS — Z5181 Encounter for therapeutic drug level monitoring: Secondary | ICD-10-CM | POA: Diagnosis not present

## 2021-08-02 DIAGNOSIS — Z7901 Long term (current) use of anticoagulants: Secondary | ICD-10-CM | POA: Diagnosis not present

## 2021-08-02 DIAGNOSIS — I4891 Unspecified atrial fibrillation: Secondary | ICD-10-CM | POA: Diagnosis not present

## 2021-08-02 DIAGNOSIS — Z952 Presence of prosthetic heart valve: Secondary | ICD-10-CM | POA: Diagnosis not present

## 2021-08-02 DIAGNOSIS — R791 Abnormal coagulation profile: Secondary | ICD-10-CM | POA: Diagnosis not present

## 2021-08-05 DIAGNOSIS — Z5181 Encounter for therapeutic drug level monitoring: Secondary | ICD-10-CM | POA: Diagnosis not present

## 2021-08-05 DIAGNOSIS — M25561 Pain in right knee: Secondary | ICD-10-CM | POA: Diagnosis not present

## 2021-08-05 DIAGNOSIS — I509 Heart failure, unspecified: Secondary | ICD-10-CM | POA: Diagnosis not present

## 2021-08-05 DIAGNOSIS — R2681 Unsteadiness on feet: Secondary | ICD-10-CM | POA: Diagnosis not present

## 2021-08-05 DIAGNOSIS — Z952 Presence of prosthetic heart valve: Secondary | ICD-10-CM | POA: Diagnosis not present

## 2021-08-05 DIAGNOSIS — Z7901 Long term (current) use of anticoagulants: Secondary | ICD-10-CM | POA: Diagnosis not present

## 2021-08-05 DIAGNOSIS — I4891 Unspecified atrial fibrillation: Secondary | ICD-10-CM | POA: Diagnosis not present

## 2021-08-05 DIAGNOSIS — F319 Bipolar disorder, unspecified: Secondary | ICD-10-CM | POA: Diagnosis not present

## 2021-08-06 ENCOUNTER — Ambulatory Visit: Payer: HMO | Admitting: Interventional Cardiology

## 2021-08-08 DIAGNOSIS — F3132 Bipolar disorder, current episode depressed, moderate: Secondary | ICD-10-CM | POA: Diagnosis not present

## 2021-08-12 DIAGNOSIS — I4891 Unspecified atrial fibrillation: Secondary | ICD-10-CM | POA: Diagnosis not present

## 2021-08-12 DIAGNOSIS — R2681 Unsteadiness on feet: Secondary | ICD-10-CM | POA: Diagnosis not present

## 2021-08-12 DIAGNOSIS — F319 Bipolar disorder, unspecified: Secondary | ICD-10-CM | POA: Diagnosis not present

## 2021-08-12 DIAGNOSIS — M25561 Pain in right knee: Secondary | ICD-10-CM | POA: Diagnosis not present

## 2021-08-12 DIAGNOSIS — I509 Heart failure, unspecified: Secondary | ICD-10-CM | POA: Diagnosis not present

## 2021-08-13 ENCOUNTER — Ambulatory Visit (INDEPENDENT_AMBULATORY_CARE_PROVIDER_SITE_OTHER): Payer: HMO

## 2021-08-13 DIAGNOSIS — Z950 Presence of cardiac pacemaker: Secondary | ICD-10-CM

## 2021-08-14 ENCOUNTER — Other Ambulatory Visit: Payer: Self-pay

## 2021-08-14 ENCOUNTER — Encounter (HOSPITAL_COMMUNITY): Payer: Self-pay | Admitting: Cardiology

## 2021-08-14 ENCOUNTER — Ambulatory Visit (HOSPITAL_COMMUNITY)
Admission: RE | Admit: 2021-08-14 | Discharge: 2021-08-14 | Disposition: A | Discharge status: Home / Self Care | Payer: HMO | Source: Ambulatory Visit | Attending: Cardiology | Admitting: Cardiology

## 2021-08-14 VITALS — BP 112/70 | HR 68 | Wt 213.4 lb

## 2021-08-14 DIAGNOSIS — Z993 Dependence on wheelchair: Secondary | ICD-10-CM | POA: Diagnosis not present

## 2021-08-14 DIAGNOSIS — I4819 Other persistent atrial fibrillation: Secondary | ICD-10-CM | POA: Insufficient documentation

## 2021-08-14 DIAGNOSIS — E785 Hyperlipidemia, unspecified: Secondary | ICD-10-CM | POA: Insufficient documentation

## 2021-08-14 DIAGNOSIS — Z7901 Long term (current) use of anticoagulants: Secondary | ICD-10-CM | POA: Insufficient documentation

## 2021-08-14 DIAGNOSIS — Z952 Presence of prosthetic heart valve: Secondary | ICD-10-CM | POA: Diagnosis not present

## 2021-08-14 DIAGNOSIS — N183 Chronic kidney disease, stage 3 unspecified: Secondary | ICD-10-CM | POA: Insufficient documentation

## 2021-08-14 DIAGNOSIS — E1122 Type 2 diabetes mellitus with diabetic chronic kidney disease: Secondary | ICD-10-CM | POA: Insufficient documentation

## 2021-08-14 DIAGNOSIS — I4892 Unspecified atrial flutter: Secondary | ICD-10-CM | POA: Diagnosis not present

## 2021-08-14 DIAGNOSIS — R531 Weakness: Secondary | ICD-10-CM | POA: Diagnosis not present

## 2021-08-14 DIAGNOSIS — Z79899 Other long term (current) drug therapy: Secondary | ICD-10-CM | POA: Insufficient documentation

## 2021-08-14 DIAGNOSIS — Z7984 Long term (current) use of oral hypoglycemic drugs: Secondary | ICD-10-CM | POA: Insufficient documentation

## 2021-08-14 DIAGNOSIS — I495 Sick sinus syndrome: Secondary | ICD-10-CM | POA: Insufficient documentation

## 2021-08-14 DIAGNOSIS — I5043 Acute on chronic combined systolic (congestive) and diastolic (congestive) heart failure: Secondary | ICD-10-CM | POA: Diagnosis not present

## 2021-08-14 DIAGNOSIS — I428 Other cardiomyopathies: Secondary | ICD-10-CM | POA: Diagnosis not present

## 2021-08-14 DIAGNOSIS — E669 Obesity, unspecified: Secondary | ICD-10-CM | POA: Insufficient documentation

## 2021-08-14 DIAGNOSIS — I130 Hypertensive heart and chronic kidney disease with heart failure and stage 1 through stage 4 chronic kidney disease, or unspecified chronic kidney disease: Secondary | ICD-10-CM | POA: Insufficient documentation

## 2021-08-14 DIAGNOSIS — F0393 Unspecified dementia, unspecified severity, with mood disturbance: Secondary | ICD-10-CM | POA: Diagnosis not present

## 2021-08-14 DIAGNOSIS — Z8673 Personal history of transient ischemic attack (TIA), and cerebral infarction without residual deficits: Secondary | ICD-10-CM | POA: Insufficient documentation

## 2021-08-14 DIAGNOSIS — I4891 Unspecified atrial fibrillation: Secondary | ICD-10-CM

## 2021-08-14 LAB — COMPREHENSIVE METABOLIC PANEL
ALT: 12 U/L (ref 0–44)
AST: 24 U/L (ref 15–41)
Albumin: 3.9 g/dL (ref 3.5–5.0)
Alkaline Phosphatase: 26 U/L — ABNORMAL LOW (ref 38–126)
Anion gap: 8 (ref 5–15)
BUN: 26 mg/dL — ABNORMAL HIGH (ref 8–23)
CO2: 27 mmol/L (ref 22–32)
Calcium: 9.3 mg/dL (ref 8.9–10.3)
Chloride: 103 mmol/L (ref 98–111)
Creatinine, Ser: 1.69 mg/dL — ABNORMAL HIGH (ref 0.44–1.00)
GFR, Estimated: 32 mL/min — ABNORMAL LOW (ref >60)
Glucose, Bld: 86 mg/dL (ref 70–99)
Potassium: 4.2 mmol/L (ref 3.5–5.1)
Sodium: 138 mmol/L (ref 135–145)
Total Bilirubin: 0.5 mg/dL (ref 0.3–1.2)
Total Protein: 7.2 g/dL (ref 6.5–8.1)

## 2021-08-14 LAB — CUP PACEART REMOTE DEVICE CHECK
Battery Remaining Longevity: 108 mo
Battery Remaining Percentage: 100 %
Brady Statistic RA Percent Paced: 8 %
Brady Statistic RV Percent Paced: 5 %
Date Time Interrogation Session: 20230322023100
Implantable Lead Implant Date: 20210816
Implantable Lead Implant Date: 20210816
Implantable Lead Location: 753859
Implantable Lead Location: 753860
Implantable Lead Model: 7842
Implantable Lead Model: 7842
Implantable Lead Serial Number: 1013287
Implantable Lead Serial Number: 1091261
Implantable Pulse Generator Implant Date: 20210816
Lead Channel Impedance Value: 469 Ohm
Lead Channel Impedance Value: 754 Ohm
Lead Channel Setting Pacing Amplitude: 2.5 V
Lead Channel Setting Pacing Amplitude: 2.5 V
Lead Channel Setting Pacing Pulse Width: 0.4 ms
Lead Channel Setting Sensing Sensitivity: 2.5 mV
Pulse Gen Serial Number: 940432

## 2021-08-14 LAB — CBC
HCT: 35.3 % — ABNORMAL LOW (ref 36.0–46.0)
Hemoglobin: 10.8 g/dL — ABNORMAL LOW (ref 12.0–15.0)
MCH: 27.8 pg (ref 26.0–34.0)
MCHC: 30.6 g/dL (ref 30.0–36.0)
MCV: 90.7 fL (ref 80.0–100.0)
Platelets: 289 10*3/uL (ref 150–400)
RBC: 3.89 MIL/uL (ref 3.87–5.11)
RDW: 14.6 % (ref 11.5–15.5)
WBC: 6 10*3/uL (ref 4.0–10.5)
nRBC: 0 % (ref 0.0–0.2)

## 2021-08-14 LAB — TSH: TSH: 2.67 u[IU]/mL (ref 0.350–4.500)

## 2021-08-14 MED ORDER — SPIRONOLACTONE 25 MG PO TABS: 25.0000 mg | ORAL_TABLET | Freq: Every day | ORAL | 11 refills | Status: DC

## 2021-08-14 MED ORDER — AMIODARONE HCL 200 MG PO TABS: 200.0000 mg | ORAL_TABLET | Freq: Every day | ORAL | 3 refills | Status: DC

## 2021-08-14 MED ORDER — FUROSEMIDE 40 MG PO TABS: ORAL_TABLET | ORAL | 5 refills | Status: DC

## 2021-08-14 NOTE — Patient Instructions (Addendum)
Medication Changes: ? ?Start Amiodarone 200 mg Twice daily for 2 weeks then take 200 mg daily. ? ?Increase Spironolactone to 25 mg daily. ? ?Increase Lasix to 40 mg Twice daily for 3 days, then 40 mg every morning and 20 mg every evening. ? ?  ? ?Lab Work: ? ?Labs done today, your results will be available in MyChart, we will contact you for abnormal readings. ? ? ?Testing/Procedures: ? ?Repeat blood work in 10 days. ? ?Weekly INR's till Cardioversion ? ?Referrals: ? ?none ? ?Special Instructions // Education: ? ?none ? ?Follow-Up in: 4 weeks  ? ?At the [facility], you and your health needs are our priority. We have a designated team specialized in the treatment of Heart Failure. This Care Team includes your primary Heart Failure Specialized Cardiologist (physician), Advanced Practice Providers (APPs- Physician Assistants and Nurse Practitioners), and Pharmacist who all work together to provide you with the care you need, when you need it.  ? ?You may see any of the following providers on your designated Care Team at your next follow up: ? ?Dr Glori Bickers ?Dr Loralie Champagne ?Darrick Grinder, NP ?Lyda Jester, PA ?Jessica Milford,NP ?Marlyce Huge, PA ?Audry Riles, PharmD ? ? ?Please be sure to bring in all your medications bottles to every appointment.  ? ?Need to Contact us: ? ?If you have any questions or concerns before your next appointment please send [place] a message through [place] or call our office at [phone].   ? ?TO LEAVE A MESSAGE FOR THE NURSE SELECT OPTION 2, PLEASE LEAVE A MESSAGE INCLUDING: ?YOUR NAME ?DATE OF BIRTH ?CALL BACK NUMBER ?REASON FOR CALL**this is important as we prioritize the call backs ? ?YOU WILL RECEIVE A CALL BACK THE SAME DAY AS LONG AS YOU CALL BEFORE 4:00 PM ? ?4 ?

## 2021-08-15 NOTE — Progress Notes (Signed)
? ? ?HF Clinic Note ? ?Primary Care: Dr Charlett Blake  ?Primary Cardiologist: Dr Tamala Julian  ?HF Cardiology: Dr. Aundra Dubin ? ?HPI: ?[name] is a 71 y.o. female with a hx of bicuspid AV/AS with ascending aortic aneurysm s/p resection/grafting of ascending aortic aneurysm and placement of St. Jude mechanical AV conduit/Bentall 2009, persistent atrial fib, bradycardia s/p [employer]  PPM implantation 12/2019, CVAs, multiple falls with prior craniotomy for subdural hematoma, prior chronic CVAs on MRI 2021, bipolar disorder, anemia, depression, debilitation (wheelchair bound), DM, HLD, HTN, mild carotid artery disease 2021, back pain, obesity, memory impairment, and chronic HFrEF.  ? ?Admitted 06/2021 with increased shortness of breath in the setting of acute HFrEF. Diuresed with IV lasix. Echo repeated EF 25-30%. Had cath with patent coronaries. Started on [place]. Discharged to [facility].  ? ?Presents today from SNF with her daughter. Arrived in a wheelchair. She remains in NSR today. She appears to have gone back into NSR some time between 5/22 and 12/22 after amiodarone was stopped. She does very little walking, transfers from wheelchair to bed.  She says that she has been weak since she had a stroke. She does get PT. She has had 3 bad falls (12/21, 2/22, 4/22) resulting in subdural hematomas.  She is, however, still on warfarin.  No recent falls. Not short of breath at rest or with transfers.  No chest pain.  She has worsening edema and is concerned about recurrent CHF exacerbation.  No orthopnea/PND.   ? ?[employer] PPM interrogation: 5% RV pacing, 8% RA pacing ? ?ECG (personally reviewed): atrial fibrillation, QTc 439 msec ? ?Labs (2/23): K 4.1, creatinine 1.68, hgb 10.8 ? ?PMH: ?1. Bicuspid aortic valve with ascending aortic aneurysm: S/p St Jude mechanical aortic valve with Bentall procedure in 2009 ?2. Atrial fibrillation: Persistent. DCCV to NSR in 12/21.  Had maintained NSR on amiodarone but  was stopped.  ?3. Bradycardia: [employer] PPM in 3/21.  ?4. CVAs: Now wheelchair-bound.  ?5. Multiple falls: 12/21 with subdural hematoma requiring craniotomy, 2/22 with subdural, 4/22 with subdural.  ?6. Bipolar disorder ?7. Dementia ?8. Hypothyroidism ?9. CKD stage 3 ?10. Chronic systolic CHF: Nonischemic cardiomyopathy.  ?- Echo (11/22): EF 40-45%.  ?- LHC/RHC (2/23): No CAD; mean RA 12, PA 45/22, PCWP mean 25, CI 3.5 ?- Echo (2/23): EF 25-30%, global HK, RV normal, mild MR, St Jude mechanical AVR with normal function.  ?11. Hyperlipidemia ?12. CKD stage 3 ?  ?Review of Systems: All systems reviewed and negative except as per HPI.  ? ?Current Outpatient Medications  ?Medication Sig Dispense Refill  ? acetaminophen (TYLENOL) 325 MG tablet Take 325 mg by mouth every 4 (four) hours as needed for mild pain.    ? albuterol (VENTOLIN HFA) 108 (90 Base) MCG/ACT inhaler Inhale 2 puffs into the lungs at bedtime.    ? amiodarone (PACERONE) 200 MG tablet Take 1 tablet (200 mg total) by mouth daily. '200mg'$  twice daily for 2 weeks then 200 mg daily after that 90 tablet 3  ? carvedilol (COREG) 12.5 MG tablet Take 1 tablet (12.5 mg total) by mouth 2 (two) times daily with a meal.    ? cholecalciferol (VITAMIN D) 25 MCG tablet Take 1 tablet (1,000 Units total) by mouth daily.    ? dapagliflozin propanediol (FARXIGA) 10 MG TABS tablet Take 1 tablet (10 mg total) by mouth daily. 30 tablet 0  ? fenofibrate 160 MG tablet Take 1 tablet (160 mg total) by mouth daily.    ?  fexofenadine (ALLEGRA) 60 MG tablet Take 60 mg by mouth daily.    ? lamoTRIgine (LAMICTAL) 100 MG tablet Take 100 mg by mouth daily.    ? lamoTRIgine (LAMICTAL) 200 MG tablet Take 200 mg by mouth at bedtime.    ? levothyroxine (SYNTHROID) 25 MCG tablet TAKE 1 TABLET BY MOUTH DAILY BEFORE BREAKFAST. 90 tablet 0  ? lip balm (CARMEX) ointment Apply topically as needed for lip care. 7 g 0  ? Melatonin 10 MG TABS Take 10 mg by mouth at bedtime.    ? Multiple Vitamin  (MULTIVITAMIN) tablet Take 1 tablet by mouth daily.    ? ondansetron (ZOFRAN) 4 MG tablet Take 1 tablet (4 mg total) by mouth every 6 (six) hours as needed for nausea. 20 tablet 0  ? pantoprazole (PROTONIX) 40 MG tablet Take 1 tablet (40 mg total) by mouth daily.    ? potassium chloride SA (KLOR-CON M) 20 MEQ tablet Take 1 tablet (20 mEq total) by mouth daily.    ? rosuvastatin (CRESTOR) 40 MG tablet Take 40 mg by mouth daily.    ? sacubitril-valsartan (ENTRESTO) 24-26 MG Take 1 tablet by mouth 2 (two) times daily. 60 tablet 0  ? traZODone (DESYREL) 50 MG tablet Take 100 mg by mouth at bedtime.    ? venlafaxine XR (EFFEXOR XR) 75 MG 24 hr capsule Take 1 capsule (75 mg total) by mouth daily with breakfast. 90 capsule 1  ? vitamin B-12 (CYANOCOBALAMIN) 1000 MCG tablet Take 1,000 mcg by mouth daily.    ? warfarin (COUMADIN) 1 MG tablet Take 1 tablet by mouth daily. Give with 2.5 mg tab. (Total 3.5 mg daily)    ? warfarin (COUMADIN) 2.5 MG tablet Take 2.5 mg by mouth daily. Give with 1 mg tab = 3.5 mg total    ? furosemide (LASIX) 40 MG tablet Take 1 tablet (40 mg total) by mouth every morning AND 0.5 tablets (20 mg total) every evening. 90 tablet 5  ? spironolactone (ALDACTONE) 25 MG tablet Take 1 tablet (25 mg total) by mouth daily. 30 tablet 11  ? ?No current facility-administered medications for this encounter.  ? ? ?Allergies  ?Allergen Reactions  ? Mucinex [Guaifenesin Er] Other (See Comments)  ?  Severe headaches ?  ? Keflex [Cephalexin] Other (See Comments)  ?  Headaches and dizziness  ? Keflex [Cephalexin] Other (See Comments)  ?  Headaches and/or dizziness- "allergic," per [facility]  ? Mucinex [Guaifenesin Er] Other (See Comments)  ?  Severe headaches- "allergic," per [facility]  ? Sulfa Antibiotics Other (See Comments)  ?  headaches  ? Sulfa Antibiotics Other (See Comments)  ?  Headaches- "allergic," per [facility] ?  ? Sulfonamide Derivatives Other (See Comments)  ?  Headaches ?  ? ? ?  ?Social History  ? ?Socioeconomic History   ? Marital status: Married  ?  Spouse name: Not on file  ? Number of children: Not on file  ? Years of education: Not on file  ? Highest education level: Not on file  ?Occupational History  ? Not on file  ?Tobacco Use  ? Smoking status: Former  ?  Types: Cigarettes  ?  Quit date: 05/26/1990  ?  Years since quitting: 31.2  ? Smokeless tobacco: Never  ?Vaping Use  ? Vaping Use: Never used  ?Substance and Sexual Activity  ? Alcohol use: Yes  ?  Alcohol/week: 0.0 standard drinks  ?  Comment: rare  ? Drug use: No  ? Sexual activity: Not  on file  ?Other Topics Concern  ? Not on file  ?Social History Narrative  ? ** Merged History Encounter **  ?    ? ?Social Determinants of Health  ? ?Financial Resource Strain: Not on file  ?Food Insecurity: Not on file  ?Transportation Needs: Not on file  ?Physical Activity: Not on file  ?Stress: Not on file  ?Social Connections: Not on file  ?Intimate Partner Violence: Not on file  ? ? ?  ?Family History  ?Problem Relation Age of Onset  ? Scoliosis Mother   ? Arthritis Mother   ?     Rheumatoid  ? Aneurysm Mother   ?     heart  ? Alcohol abuse Father   ? Depression Sister   ? Depression Brother   ? Alcohol abuse Brother   ? Cancer Maternal Grandmother   ?     colon  ? Diabetes Maternal Grandfather   ? Heart disease Maternal Grandfather   ? Alcohol abuse Maternal Grandfather   ? Depression Paternal Grandmother   ? Diabetes Paternal Grandmother   ? Depression Paternal Grandfather   ? Diabetes Paternal Grandfather   ? Depression Sister   ? Alcohol abuse Brother   ? Depression Brother   ? Heart disease Brother   ? ? ?Vitals:  ? 08/14/21 1456  ?BP: 112/70  ?Pulse: 68  ?SpO2: 96%  ?Weight: 96.8 kg (213 lb 6.4 oz)  ? ?Wt Readings from Last 3 Encounters:  ?08/14/21 96.8 kg (213 lb 6.4 oz)  ?07/17/21 96 kg (211 lb 9.6 oz)  ?07/07/21 93 kg (205 lb 1.6 oz)  ? ? ?PHYSICAL EXAM: ?General: NAD ?Neck: JVP 8-9 cm with HJR, no thyromegaly or thyroid nodule.  ?Lungs: Clear to auscultation bilaterally with  normal respiratory effort. ?CV: Nondisplaced PMI.  Heart irregular S1/S2 with mechanical S2, no S3/S4, no murmur.  1+ ankle edema.  No carotid bruit.  Normal pedal pulses.  ?Abdomen: Soft, nontende

## 2021-08-19 DIAGNOSIS — R2681 Unsteadiness on feet: Secondary | ICD-10-CM | POA: Diagnosis not present

## 2021-08-19 DIAGNOSIS — Z5181 Encounter for therapeutic drug level monitoring: Secondary | ICD-10-CM | POA: Diagnosis not present

## 2021-08-19 DIAGNOSIS — I5041 Acute combined systolic (congestive) and diastolic (congestive) heart failure: Secondary | ICD-10-CM | POA: Diagnosis not present

## 2021-08-19 DIAGNOSIS — I509 Heart failure, unspecified: Secondary | ICD-10-CM | POA: Diagnosis not present

## 2021-08-19 DIAGNOSIS — Z8679 Personal history of other diseases of the circulatory system: Secondary | ICD-10-CM | POA: Diagnosis not present

## 2021-08-19 DIAGNOSIS — M25561 Pain in right knee: Secondary | ICD-10-CM | POA: Diagnosis not present

## 2021-08-19 DIAGNOSIS — F319 Bipolar disorder, unspecified: Secondary | ICD-10-CM | POA: Diagnosis not present

## 2021-08-19 DIAGNOSIS — I4891 Unspecified atrial fibrillation: Secondary | ICD-10-CM | POA: Diagnosis not present

## 2021-08-25 DIAGNOSIS — I480 Paroxysmal atrial fibrillation: Secondary | ICD-10-CM | POA: Diagnosis not present

## 2021-08-25 DIAGNOSIS — R262 Difficulty in walking, not elsewhere classified: Secondary | ICD-10-CM | POA: Diagnosis not present

## 2021-08-26 DIAGNOSIS — R791 Abnormal coagulation profile: Secondary | ICD-10-CM | POA: Diagnosis not present

## 2021-08-26 DIAGNOSIS — Z952 Presence of prosthetic heart valve: Secondary | ICD-10-CM | POA: Diagnosis not present

## 2021-08-26 DIAGNOSIS — Z7901 Long term (current) use of anticoagulants: Secondary | ICD-10-CM | POA: Diagnosis not present

## 2021-08-27 DIAGNOSIS — Z7901 Long term (current) use of anticoagulants: Secondary | ICD-10-CM | POA: Diagnosis not present

## 2021-08-27 DIAGNOSIS — I272 Pulmonary hypertension, unspecified: Secondary | ICD-10-CM | POA: Diagnosis not present

## 2021-08-27 DIAGNOSIS — I150 Renovascular hypertension: Secondary | ICD-10-CM | POA: Diagnosis not present

## 2021-08-27 DIAGNOSIS — Z952 Presence of prosthetic heart valve: Secondary | ICD-10-CM | POA: Diagnosis not present

## 2021-08-27 DIAGNOSIS — M76899 Other specified enthesopathies of unspecified lower limb, excluding foot: Secondary | ICD-10-CM | POA: Diagnosis not present

## 2021-08-27 DIAGNOSIS — I5042 Chronic combined systolic (congestive) and diastolic (congestive) heart failure: Secondary | ICD-10-CM | POA: Diagnosis not present

## 2021-08-27 DIAGNOSIS — J9621 Acute and chronic respiratory failure with hypoxia: Secondary | ICD-10-CM | POA: Diagnosis not present

## 2021-08-27 DIAGNOSIS — N1831 Chronic kidney disease, stage 3a: Secondary | ICD-10-CM | POA: Diagnosis not present

## 2021-08-27 DIAGNOSIS — R262 Difficulty in walking, not elsewhere classified: Secondary | ICD-10-CM | POA: Diagnosis not present

## 2021-08-27 DIAGNOSIS — I4891 Unspecified atrial fibrillation: Secondary | ICD-10-CM | POA: Diagnosis not present

## 2021-08-27 DIAGNOSIS — E039 Hypothyroidism, unspecified: Secondary | ICD-10-CM | POA: Diagnosis not present

## 2021-08-27 DIAGNOSIS — M2391 Unspecified internal derangement of right knee: Secondary | ICD-10-CM | POA: Diagnosis not present

## 2021-08-28 NOTE — Progress Notes (Signed)
Remote pacemaker transmission.   

## 2021-09-02 DIAGNOSIS — F319 Bipolar disorder, unspecified: Secondary | ICD-10-CM | POA: Diagnosis not present

## 2021-09-02 DIAGNOSIS — I4891 Unspecified atrial fibrillation: Secondary | ICD-10-CM | POA: Diagnosis not present

## 2021-09-02 DIAGNOSIS — R2681 Unsteadiness on feet: Secondary | ICD-10-CM | POA: Diagnosis not present

## 2021-09-02 DIAGNOSIS — M25561 Pain in right knee: Secondary | ICD-10-CM | POA: Diagnosis not present

## 2021-09-02 DIAGNOSIS — I509 Heart failure, unspecified: Secondary | ICD-10-CM | POA: Diagnosis not present

## 2021-09-09 DIAGNOSIS — M25561 Pain in right knee: Secondary | ICD-10-CM | POA: Diagnosis not present

## 2021-09-09 DIAGNOSIS — R2681 Unsteadiness on feet: Secondary | ICD-10-CM | POA: Diagnosis not present

## 2021-09-09 DIAGNOSIS — I4891 Unspecified atrial fibrillation: Secondary | ICD-10-CM | POA: Diagnosis not present

## 2021-09-09 DIAGNOSIS — F319 Bipolar disorder, unspecified: Secondary | ICD-10-CM | POA: Diagnosis not present

## 2021-09-09 DIAGNOSIS — F3132 Bipolar disorder, current episode depressed, moderate: Secondary | ICD-10-CM | POA: Diagnosis not present

## 2021-09-09 DIAGNOSIS — I509 Heart failure, unspecified: Secondary | ICD-10-CM | POA: Diagnosis not present

## 2021-09-09 DIAGNOSIS — F5101 Primary insomnia: Secondary | ICD-10-CM | POA: Diagnosis not present

## 2021-09-10 ENCOUNTER — Other Ambulatory Visit (HOSPITAL_COMMUNITY): Payer: Self-pay

## 2021-09-10 ENCOUNTER — Ambulatory Visit (HOSPITAL_COMMUNITY)
Admission: RE | Admit: 2021-09-10 | Discharge: 2021-09-10 | Disposition: A | Discharge status: Home / Self Care | Payer: HMO | Source: Ambulatory Visit | Attending: Cardiology | Admitting: Cardiology

## 2021-09-10 ENCOUNTER — Encounter (HOSPITAL_COMMUNITY): Payer: Self-pay | Admitting: Cardiology

## 2021-09-10 VITALS — BP 120/70 | HR 66 | Wt 216.4 lb

## 2021-09-10 DIAGNOSIS — N183 Chronic kidney disease, stage 3 unspecified: Secondary | ICD-10-CM | POA: Diagnosis not present

## 2021-09-10 DIAGNOSIS — I495 Sick sinus syndrome: Secondary | ICD-10-CM | POA: Diagnosis not present

## 2021-09-10 DIAGNOSIS — I4819 Other persistent atrial fibrillation: Secondary | ICD-10-CM | POA: Diagnosis not present

## 2021-09-10 DIAGNOSIS — E785 Hyperlipidemia, unspecified: Secondary | ICD-10-CM | POA: Insufficient documentation

## 2021-09-10 DIAGNOSIS — Z8673 Personal history of transient ischemic attack (TIA), and cerebral infarction without residual deficits: Secondary | ICD-10-CM | POA: Insufficient documentation

## 2021-09-10 DIAGNOSIS — D649 Anemia, unspecified: Secondary | ICD-10-CM | POA: Diagnosis not present

## 2021-09-10 DIAGNOSIS — F319 Bipolar disorder, unspecified: Secondary | ICD-10-CM | POA: Insufficient documentation

## 2021-09-10 DIAGNOSIS — I5022 Chronic systolic (congestive) heart failure: Secondary | ICD-10-CM | POA: Insufficient documentation

## 2021-09-10 DIAGNOSIS — Z7901 Long term (current) use of anticoagulants: Secondary | ICD-10-CM | POA: Insufficient documentation

## 2021-09-10 DIAGNOSIS — E1122 Type 2 diabetes mellitus with diabetic chronic kidney disease: Secondary | ICD-10-CM | POA: Diagnosis not present

## 2021-09-10 DIAGNOSIS — Z993 Dependence on wheelchair: Secondary | ICD-10-CM | POA: Diagnosis not present

## 2021-09-10 DIAGNOSIS — Z79899 Other long term (current) drug therapy: Secondary | ICD-10-CM | POA: Insufficient documentation

## 2021-09-10 DIAGNOSIS — F0393 Unspecified dementia, unspecified severity, with mood disturbance: Secondary | ICD-10-CM | POA: Diagnosis not present

## 2021-09-10 DIAGNOSIS — I5043 Acute on chronic combined systolic (congestive) and diastolic (congestive) heart failure: Secondary | ICD-10-CM | POA: Diagnosis not present

## 2021-09-10 DIAGNOSIS — Z952 Presence of prosthetic heart valve: Secondary | ICD-10-CM | POA: Diagnosis not present

## 2021-09-10 DIAGNOSIS — I130 Hypertensive heart and chronic kidney disease with heart failure and stage 1 through stage 4 chronic kidney disease, or unspecified chronic kidney disease: Secondary | ICD-10-CM | POA: Insufficient documentation

## 2021-09-10 DIAGNOSIS — I4891 Unspecified atrial fibrillation: Secondary | ICD-10-CM

## 2021-09-10 DIAGNOSIS — E669 Obesity, unspecified: Secondary | ICD-10-CM | POA: Insufficient documentation

## 2021-09-10 DIAGNOSIS — I428 Other cardiomyopathies: Secondary | ICD-10-CM | POA: Insufficient documentation

## 2021-09-10 DIAGNOSIS — I482 Chronic atrial fibrillation, unspecified: Secondary | ICD-10-CM

## 2021-09-10 DIAGNOSIS — I7121 Aneurysm of the ascending aorta, without rupture: Secondary | ICD-10-CM | POA: Diagnosis not present

## 2021-09-10 LAB — BASIC METABOLIC PANEL
Anion gap: 8 (ref 5–15)
BUN: 34 mg/dL — ABNORMAL HIGH (ref 8–23)
CO2: 25 mmol/L (ref 22–32)
Calcium: 9.3 mg/dL (ref 8.9–10.3)
Chloride: 105 mmol/L (ref 98–111)
Creatinine, Ser: 1.62 mg/dL — ABNORMAL HIGH (ref 0.44–1.00)
GFR, Estimated: 34 mL/min — ABNORMAL LOW (ref >60)
Glucose, Bld: 123 mg/dL — ABNORMAL HIGH (ref 70–99)
Potassium: 4.3 mmol/L (ref 3.5–5.1)
Sodium: 138 mmol/L (ref 135–145)

## 2021-09-10 LAB — PROTIME-INR
INR: 2.6 — ABNORMAL HIGH (ref 0.8–1.2)
Prothrombin Time: 28 seconds — ABNORMAL HIGH (ref 11.4–15.2)

## 2021-09-10 MED ORDER — ENTRESTO 49-51 MG PO TABS: 1.0000 | ORAL_TABLET | Freq: Two times a day (BID) | ORAL | 11 refills | Status: DC

## 2021-09-10 NOTE — Progress Notes (Signed)
? ? ?HF Clinic Note ? ?Primary Care: Dr Charlett Blake  ?Primary Cardiologist: Dr Tamala Julian  ?HF Cardiology: Dr. Aundra Dubin ? ?HPI: ?[name] is a 71 y.o. female with a hx of bicuspid AV/AS with ascending aortic aneurysm s/p resection/grafting of ascending aortic aneurysm and placement of St. Jude mechanical AV conduit/Bentall 2009, persistent atrial fib, bradycardia s/p [employer]  PPM implantation 12/2019, CVAs, multiple falls with prior craniotomy for subdural hematoma, prior chronic CVAs on MRI 2021, bipolar disorder, anemia, depression, debilitation (wheelchair bound), DM, HLD, HTN, mild carotid artery disease 2021, back pain, obesity, memory impairment, and chronic HFrEF.  ? ?Admitted 06/2021 with increased shortness of breath in the setting of acute HFrEF. Diuresed with IV lasix. Echo repeated EF 25-30%. Had cath with patent coronaries. Started on [place]. Discharged to [facility].  ? ?She remains in atrial fibrillation today. She appears to have gone back into atrial fibrillation some time between 5/22 and 12/22 after amiodarone was stopped. She has had 3 bad falls (12/21, 2/22, 4/22) resulting in subdural hematomas.  She is, however, still on warfarin.   ? ?Patient returns for followup of CHF and AF.  Weight is up 3 lbs.  She has been walking more, using rollator.  No dyspnea walking several hundred feet with rollator. No orthopnea/PND. No chest pain.  No lightheadedness or recent falls.  INR is therapeutic today and on review of SNF INRs, has been therapeutic since 07/16/21.  ? ?ECG (personally reviewed): atrial fibrillation, occasional v-pacing ? ?Labs (2/23): K 4.1, creatinine 1.68, hgb 10.8 ?Labs (3/23): K 4.2, creatinine 1.69, hgb 10.8, LFTs normal, TSH normal.  ? ?PMH: ?1. Bicuspid aortic valve with ascending aortic aneurysm: S/p St Jude mechanical aortic valve with Bentall procedure in 2009 ?2. Atrial fibrillation: Persistent. DCCV to NSR in 12/21.  Had maintained NSR on amiodarone but was  stopped.  ?3. Bradycardia: [employer] PPM in 3/21.  ?4. CVAs: Now wheelchair-bound.  ?5. Multiple falls: 12/21 with subdural hematoma requiring craniotomy, 2/22 with subdural, 4/22 with subdural.  ?6. Bipolar disorder ?7. Dementia ?8. Hypothyroidism ?9. CKD stage 3 ?10. Chronic systolic CHF: Nonischemic cardiomyopathy.  ?- Echo (11/22): EF 40-45%.  ?- LHC/RHC (2/23): No CAD; mean RA 12, PA 45/22, PCWP mean 25, CI 3.5 ?- Echo (2/23): EF 25-30%, global HK, RV normal, mild MR, St Jude mechanical AVR with normal function.  ?11. Hyperlipidemia ?12. CKD stage 3 ?  ?Review of Systems: All systems reviewed and negative except as per HPI.  ? ?Current Outpatient Medications  ?Medication Sig Dispense Refill  ? acetaminophen (TYLENOL) 325 MG tablet Take 325 mg by mouth every 4 (four) hours as needed for mild pain.    ? albuterol (VENTOLIN HFA) 108 (90 Base) MCG/ACT inhaler Inhale 2 puffs into the lungs at bedtime.    ? amiodarone (PACERONE) 200 MG tablet Take 200 mg by mouth daily.    ? carvedilol (COREG) 12.5 MG tablet Take 1 tablet (12.5 mg total) by mouth 2 (two) times daily with a meal.    ? dapagliflozin propanediol (FARXIGA) 10 MG TABS tablet Take 1 tablet (10 mg total) by mouth daily. 30 tablet 0  ? fenofibrate 160 MG tablet Take 1 tablet (160 mg total) by mouth daily.    ? fexofenadine (ALLEGRA) 60 MG tablet Take 60 mg by mouth daily.    ? furosemide (LASIX) 40 MG tablet Take 1 tablet (40 mg total) by mouth every morning AND 0.5 tablets (20 mg total) every evening. [place]  tablet 5  ? lamoTRIgine (LAMICTAL) 100 MG tablet Take 100 mg by mouth daily.    ? lamoTRIgine (LAMICTAL) 200 MG tablet Take 200 mg by mouth at bedtime.    ? levothyroxine (SYNTHROID) 25 MCG tablet TAKE 1 TABLET BY MOUTH DAILY BEFORE BREAKFAST. 90 tablet 0  ? lip balm (CARMEX) ointment Apply topically as needed for lip care. 7 g 0  ? Melatonin 10 MG TABS Take 10 mg by mouth at bedtime.    ? Multiple Vitamin (MULTIVITAMIN) tablet Take 1 tablet by  mouth daily.    ? ondansetron (ZOFRAN) 4 MG tablet Take 1 tablet (4 mg total) by mouth every 6 (six) hours as needed for nausea. 20 tablet 0  ? pantoprazole (PROTONIX) 40 MG tablet Take 1 tablet (40 mg total) by mouth daily.    ? potassium chloride SA (KLOR-CON M) 20 MEQ tablet Take 1 tablet (20 mEq total) by mouth daily.    ? rosuvastatin (CRESTOR) 40 MG tablet Take 40 mg by mouth daily.    ? sacubitril-valsartan (ENTRESTO) 49-51 MG Take 1 tablet by mouth 2 (two) times daily. 60 tablet 11  ? spironolactone (ALDACTONE) 25 MG tablet Take 1 tablet (25 mg total) by mouth daily. 30 tablet 11  ? traZODone (DESYREL) 50 MG tablet Take 100 mg by mouth at bedtime.    ? venlafaxine XR (EFFEXOR XR) 75 MG 24 hr capsule Take 1 capsule (75 mg total) by mouth daily with breakfast. 90 capsule 1  ? vitamin B-12 (CYANOCOBALAMIN) 1000 MCG tablet Take 1,000 mcg by mouth daily.    ? warfarin (COUMADIN) 1 MG tablet Take 1 tablet by mouth daily. Give with 2.5 mg tab. (Total 3.5 mg daily)    ? warfarin (COUMADIN) 2.5 MG tablet Take 2.5 mg by mouth daily. Give with 1 mg tab = 3.5 mg total    ? cholecalciferol (VITAMIN D) 25 MCG tablet Take 1 tablet (1,000 Units total) by mouth daily.    ? ?No current facility-administered medications for this encounter.  ? ? ?Allergies  ?Allergen Reactions  ? Mucinex [Guaifenesin Er] Other (See Comments)  ?  Severe headaches ?  ? Keflex [Cephalexin] Other (See Comments)  ?  Headaches and dizziness  ? Keflex [Cephalexin] Other (See Comments)  ?  Headaches and/or dizziness- "allergic," per [facility]  ? Mucinex [Guaifenesin Er] Other (See Comments)  ?  Severe headaches- "allergic," per [facility]  ? Sulfa Antibiotics Other (See Comments)  ?  headaches  ? Sulfa Antibiotics Other (See Comments)  ?  Headaches- "allergic," per [facility] ?  ? Sulfonamide Derivatives Other (See Comments)  ?  Headaches ?  ? ? ?  ?Social History  ? ?Socioeconomic History  ? Marital status: Married  ?  Spouse name: Not on file  ? Number of children: Not  on file  ? Years of education: Not on file  ? Highest education level: Not on file  ?Occupational History  ? Not on file  ?Tobacco Use  ? Smoking status: Former  ?  Types: Cigarettes  ?  Quit date: 05/26/1990  ?  Years since quitting: 31.3  ? Smokeless tobacco: Never  ?Vaping Use  ? Vaping Use: Never used  ?Substance and Sexual Activity  ? Alcohol use: Yes  ?  Alcohol/week: 0.0 standard drinks  ?  Comment: rare  ? Drug use: No  ? Sexual activity: Not on file  ?Other Topics Concern  ? Not on file  ?Social History Narrative  ? ** Merged History  Encounter **  ?    ? ?Social Determinants of Health  ? ?Financial Resource Strain: Not on file  ?Food Insecurity: Not on file  ?Transportation Needs: Not on file  ?Physical Activity: Not on file  ?Stress: Not on file  ?Social Connections: Not on file  ?Intimate Partner Violence: Not on file  ? ? ?  ?Family History  ?Problem Relation Age of Onset  ? Scoliosis Mother   ? Arthritis Mother   ?     Rheumatoid  ? Aneurysm Mother   ?     heart  ? Alcohol abuse Father   ? Depression Sister   ? Depression Brother   ? Alcohol abuse Brother   ? Cancer Maternal Grandmother   ?     colon  ? Diabetes Maternal Grandfather   ? Heart disease Maternal Grandfather   ? Alcohol abuse Maternal Grandfather   ? Depression Paternal Grandmother   ? Diabetes Paternal Grandmother   ? Depression Paternal Grandfather   ? Diabetes Paternal Grandfather   ? Depression Sister   ? Alcohol abuse Brother   ? Depression Brother   ? Heart disease Brother   ? ? ?Vitals:  ? 09/10/21 0849  ?BP: 120/70  ?Pulse: 66  ?SpO2: 96%  ?Weight: 98.2 kg (216 lb 6.4 oz)  ? ?Wt Readings from Last 3 Encounters:  ?09/10/21 98.2 kg (216 lb 6.4 oz)  ?08/14/21 96.8 kg (213 lb 6.4 oz)  ?07/17/21 96 kg (211 lb 9.6 oz)  ? ? ?PHYSICAL EXAM: ?General: NAD ?Neck: No JVD, no thyromegaly or thyroid nodule.  ?Lungs: Clear to auscultation bilaterally with normal respiratory effort. ?CV: Nondisplaced PMI.  Heart irregular S1/S2 with mechanical S2,  no S3/S4, 1/6 SEM RUSB.  Trace left ankle edema.  No carotid bruit.  Normal pedal pulses.  ?Abdomen: Soft, nontender, no hepatosplenomegaly, no distention.  ?Skin: Intact without lesions or rashes.  ?Neurolog

## 2021-09-10 NOTE — H&P (View-Only) (Signed)
? ? ?HF Clinic Note ? ?Primary Care: Dr Charlett Blake  ?Primary Cardiologist: Dr Tamala Julian  ?HF Cardiology: Dr. Aundra Dubin ? ?HPI: ?[name] is a 71 y.o. female with a hx of bicuspid AV/AS with ascending aortic aneurysm s/p resection/grafting of ascending aortic aneurysm and placement of St. Jude mechanical AV conduit/Bentall 2009, persistent atrial fib, bradycardia s/p [employer]  PPM implantation 12/2019, CVAs, multiple falls with prior craniotomy for subdural hematoma, prior chronic CVAs on MRI 2021, bipolar disorder, anemia, depression, debilitation (wheelchair bound), DM, HLD, HTN, mild carotid artery disease 2021, back pain, obesity, memory impairment, and chronic HFrEF.  ? ?Admitted 06/2021 with increased shortness of breath in the setting of acute HFrEF. Diuresed with IV lasix. Echo repeated EF 25-30%. Had cath with patent coronaries. Started on [place]. Discharged to [facility].  ? ?She remains in atrial fibrillation today. She appears to have gone back into atrial fibrillation some time between 5/22 and 12/22 after amiodarone was stopped. She has had 3 bad falls (12/21, 2/22, 4/22) resulting in subdural hematomas.  She is, however, still on warfarin.   ? ?Patient returns for followup of CHF and AF.  Weight is up 3 lbs.  She has been walking more, using rollator.  No dyspnea walking several hundred feet with rollator. No orthopnea/PND. No chest pain.  No lightheadedness or recent falls.  INR is therapeutic today and on review of SNF INRs, has been therapeutic since 07/16/21.  ? ?ECG (personally reviewed): atrial fibrillation, occasional v-pacing ? ?Labs (2/23): K 4.1, creatinine 1.68, hgb 10.8 ?Labs (3/23): K 4.2, creatinine 1.69, hgb 10.8, LFTs normal, TSH normal.  ? ?PMH: ?1. Bicuspid aortic valve with ascending aortic aneurysm: S/p St Jude mechanical aortic valve with Bentall procedure in 2009 ?2. Atrial fibrillation: Persistent. DCCV to NSR in 12/21.  Had maintained NSR on amiodarone but was  stopped.  ?3. Bradycardia: [employer] PPM in 3/21.  ?4. CVAs: Now wheelchair-bound.  ?5. Multiple falls: 12/21 with subdural hematoma requiring craniotomy, 2/22 with subdural, 4/22 with subdural.  ?6. Bipolar disorder ?7. Dementia ?8. Hypothyroidism ?9. CKD stage 3 ?10. Chronic systolic CHF: Nonischemic cardiomyopathy.  ?- Echo (11/22): EF 40-45%.  ?- LHC/RHC (2/23): No CAD; mean RA 12, PA 45/22, PCWP mean 25, CI 3.5 ?- Echo (2/23): EF 25-30%, global HK, RV normal, mild MR, St Jude mechanical AVR with normal function.  ?11. Hyperlipidemia ?12. CKD stage 3 ?  ?Review of Systems: All systems reviewed and negative except as per HPI.  ? ?Current Outpatient Medications  ?Medication Sig Dispense Refill  ? acetaminophen (TYLENOL) 325 MG tablet Take 325 mg by mouth every 4 (four) hours as needed for mild pain.    ? albuterol (VENTOLIN HFA) 108 (90 Base) MCG/ACT inhaler Inhale 2 puffs into the lungs at bedtime.    ? amiodarone (PACERONE) 200 MG tablet Take 200 mg by mouth daily.    ? carvedilol (COREG) 12.5 MG tablet Take 1 tablet (12.5 mg total) by mouth 2 (two) times daily with a meal.    ? dapagliflozin propanediol (FARXIGA) 10 MG TABS tablet Take 1 tablet (10 mg total) by mouth daily. 30 tablet 0  ? fenofibrate 160 MG tablet Take 1 tablet (160 mg total) by mouth daily.    ? fexofenadine (ALLEGRA) 60 MG tablet Take 60 mg by mouth daily.    ? furosemide (LASIX) 40 MG tablet Take 1 tablet (40 mg total) by mouth every morning AND 0.5 tablets (20 mg total) every evening. [place]  tablet 5  ? lamoTRIgine (LAMICTAL) 100 MG tablet Take 100 mg by mouth daily.    ? lamoTRIgine (LAMICTAL) 200 MG tablet Take 200 mg by mouth at bedtime.    ? levothyroxine (SYNTHROID) 25 MCG tablet TAKE 1 TABLET BY MOUTH DAILY BEFORE BREAKFAST. 90 tablet 0  ? lip balm (CARMEX) ointment Apply topically as needed for lip care. 7 g 0  ? Melatonin 10 MG TABS Take 10 mg by mouth at bedtime.    ? Multiple Vitamin (MULTIVITAMIN) tablet Take 1 tablet by  mouth daily.    ? ondansetron (ZOFRAN) 4 MG tablet Take 1 tablet (4 mg total) by mouth every 6 (six) hours as needed for nausea. 20 tablet 0  ? pantoprazole (PROTONIX) 40 MG tablet Take 1 tablet (40 mg total) by mouth daily.    ? potassium chloride SA (KLOR-CON M) 20 MEQ tablet Take 1 tablet (20 mEq total) by mouth daily.    ? rosuvastatin (CRESTOR) 40 MG tablet Take 40 mg by mouth daily.    ? sacubitril-valsartan (ENTRESTO) 49-51 MG Take 1 tablet by mouth 2 (two) times daily. 60 tablet 11  ? spironolactone (ALDACTONE) 25 MG tablet Take 1 tablet (25 mg total) by mouth daily. 30 tablet 11  ? traZODone (DESYREL) 50 MG tablet Take 100 mg by mouth at bedtime.    ? venlafaxine XR (EFFEXOR XR) 75 MG 24 hr capsule Take 1 capsule (75 mg total) by mouth daily with breakfast. 90 capsule 1  ? vitamin B-12 (CYANOCOBALAMIN) 1000 MCG tablet Take 1,000 mcg by mouth daily.    ? warfarin (COUMADIN) 1 MG tablet Take 1 tablet by mouth daily. Give with 2.5 mg tab. (Total 3.5 mg daily)    ? warfarin (COUMADIN) 2.5 MG tablet Take 2.5 mg by mouth daily. Give with 1 mg tab = 3.5 mg total    ? cholecalciferol (VITAMIN D) 25 MCG tablet Take 1 tablet (1,000 Units total) by mouth daily.    ? ?No current facility-administered medications for this encounter.  ? ? ?Allergies  ?Allergen Reactions  ? Mucinex [Guaifenesin Er] Other (See Comments)  ?  Severe headaches ?  ? Keflex [Cephalexin] Other (See Comments)  ?  Headaches and dizziness  ? Keflex [Cephalexin] Other (See Comments)  ?  Headaches and/or dizziness- "allergic," per [facility]  ? Mucinex [Guaifenesin Er] Other (See Comments)  ?  Severe headaches- "allergic," per [facility]  ? Sulfa Antibiotics Other (See Comments)  ?  headaches  ? Sulfa Antibiotics Other (See Comments)  ?  Headaches- "allergic," per [facility] ?  ? Sulfonamide Derivatives Other (See Comments)  ?  Headaches ?  ? ? ?  ?Social History  ? ?Socioeconomic History  ? Marital status: Married  ?  Spouse name: Not on file  ? Number of children: Not  on file  ? Years of education: Not on file  ? Highest education level: Not on file  ?Occupational History  ? Not on file  ?Tobacco Use  ? Smoking status: Former  ?  Types: Cigarettes  ?  Quit date: 05/26/1990  ?  Years since quitting: 31.3  ? Smokeless tobacco: Never  ?Vaping Use  ? Vaping Use: Never used  ?Substance and Sexual Activity  ? Alcohol use: Yes  ?  Alcohol/week: 0.0 standard drinks  ?  Comment: rare  ? Drug use: No  ? Sexual activity: Not on file  ?Other Topics Concern  ? Not on file  ?Social History Narrative  ? ** Merged History  Encounter **  ?    ? ?Social Determinants of Health  ? ?Financial Resource Strain: Not on file  ?Food Insecurity: Not on file  ?Transportation Needs: Not on file  ?Physical Activity: Not on file  ?Stress: Not on file  ?Social Connections: Not on file  ?Intimate Partner Violence: Not on file  ? ? ?  ?Family History  ?Problem Relation Age of Onset  ? Scoliosis Mother   ? Arthritis Mother   ?     Rheumatoid  ? Aneurysm Mother   ?     heart  ? Alcohol abuse Father   ? Depression Sister   ? Depression Brother   ? Alcohol abuse Brother   ? Cancer Maternal Grandmother   ?     colon  ? Diabetes Maternal Grandfather   ? Heart disease Maternal Grandfather   ? Alcohol abuse Maternal Grandfather   ? Depression Paternal Grandmother   ? Diabetes Paternal Grandmother   ? Depression Paternal Grandfather   ? Diabetes Paternal Grandfather   ? Depression Sister   ? Alcohol abuse Brother   ? Depression Brother   ? Heart disease Brother   ? ? ?Vitals:  ? 09/10/21 0849  ?BP: 120/70  ?Pulse: 66  ?SpO2: 96%  ?Weight: 98.2 kg (216 lb 6.4 oz)  ? ?Wt Readings from Last 3 Encounters:  ?09/10/21 98.2 kg (216 lb 6.4 oz)  ?08/14/21 96.8 kg (213 lb 6.4 oz)  ?07/17/21 96 kg (211 lb 9.6 oz)  ? ? ?PHYSICAL EXAM: ?General: NAD ?Neck: No JVD, no thyromegaly or thyroid nodule.  ?Lungs: Clear to auscultation bilaterally with normal respiratory effort. ?CV: Nondisplaced PMI.  Heart irregular S1/S2 with mechanical S2,  no S3/S4, 1/6 SEM RUSB.  Trace left ankle edema.  No carotid bruit.  Normal pedal pulses.  ?Abdomen: Soft, nontender, no hepatosplenomegaly, no distention.  ?Skin: Intact without lesions or rashes.  ?Neurolog

## 2021-09-10 NOTE — Patient Instructions (Signed)
Medication Changes: ? ?INCREASE Entresto to 49/51 Twice daily ? ? ?Lab Work: ? ?Labs done today, your results will be available in MyChart, we will contact you for abnormal readings. ? ? ?Testing/Procedures: ? ?Repeat blood work in 10 days. ? ?You are scheduled for a TEE/Cardioversion/TEE Cardioversion on May 1st  with Dr. Aundra Dubin.  Please arrive at the [facility] (Main Entrance A) at [facility]: [address] [place], [place] [address] at 11.30 am. (1 hour prior to procedure unless lab work is needed; if lab work is needed arrive 1.5 hours ahead) ? ?DIET: Nothing to eat or drink after midnight except a sip of water with medications (see medication instructions below) ? ?FYI: For your safety, and to allow [place] to monitor your vital signs accurately during the surgery/procedure we request that   ?if you have artificial nails, gel coating, SNS etc. Please have those removed prior to your surgery/procedure. Not having the nail coverings /polish removed may result in cancellation or delay of your surgery/procedure. ? ? ?Medication Instructions: ?Hold Spironolactone and Furosemide morning of procedure   ? ?Continue your anticoagulant: warfarin ?You will need to continue your anticoagulant after your procedure until you  are told by your  ?Provider that it is safe to stop ? ? ?Labs: If patient is on Coumadin, patient needs pt/INR, CBC, BMET within 3 days (No pt/INR needed for patients taking Xarelto, Eliquis, Pradaxa) ?For patients receiving anesthesia for TEE and all Cardioversion patients: BMET, CBC within 1 week ? ?You must have a responsible person to drive you home and stay in the waiting area during your procedure. Failure to do so could result in cancellation. ? ?Interior and spatial designer cards. ? ?*Special Note: Every effort is made to have your procedure done on time. Occasionally there are emergencies that occur at the hospital that may cause delays. Please be patient if a delay does occur.   ? ? ?Referrals: ? ?none ? ?Special Instructions // Education: ? ?You will be called to arrange the Cardioversion later today  ? ?Follow-Up in: 6 weeks  ? ?At the [facility], you and your health needs are our priority. We have a designated team specialized in the treatment of Heart Failure. This Care Team includes your primary Heart Failure Specialized Cardiologist (physician), Advanced Practice Providers (APPs- Physician Assistants and Nurse Practitioners), and Pharmacist who all work together to provide you with the care you need, when you need it.  ? ?You may see any of the following providers on your designated Care Team at your next follow up: ? ?Dr Glori Bickers ?Dr Loralie Champagne ?Darrick Grinder, NP ?Lyda Jester, PA ?Jessica Milford,NP ?Marlyce Huge, PA ?Audry Riles, PharmD ? ? ?Please be sure to bring in all your medications bottles to every appointment.  ? ?Need to Contact us: ? ?If you have any questions or concerns before your next appointment please send [place] a message through [place] or call our office at [phone].   ? ?TO LEAVE A MESSAGE FOR THE NURSE SELECT OPTION 2, PLEASE LEAVE A MESSAGE INCLUDING: ?YOUR NAME ?DATE OF BIRTH ?CALL BACK NUMBER ?REASON FOR CALL**this is important as we prioritize the call backs ? ?YOU WILL RECEIVE A CALL BACK THE SAME DAY AS LONG AS YOU CALL BEFORE 4:00 PM ? ? ?

## 2021-09-15 DIAGNOSIS — N179 Acute kidney failure, unspecified: Secondary | ICD-10-CM | POA: Diagnosis not present

## 2021-09-15 DIAGNOSIS — I5043 Acute on chronic combined systolic (congestive) and diastolic (congestive) heart failure: Secondary | ICD-10-CM | POA: Diagnosis not present

## 2021-09-15 DIAGNOSIS — I480 Paroxysmal atrial fibrillation: Secondary | ICD-10-CM | POA: Diagnosis not present

## 2021-09-15 DIAGNOSIS — R2681 Unsteadiness on feet: Secondary | ICD-10-CM | POA: Diagnosis not present

## 2021-09-16 ENCOUNTER — Encounter (HOSPITAL_COMMUNITY): Payer: Self-pay | Admitting: Cardiology

## 2021-09-16 DIAGNOSIS — I4891 Unspecified atrial fibrillation: Secondary | ICD-10-CM | POA: Diagnosis not present

## 2021-09-16 DIAGNOSIS — N390 Urinary tract infection, site not specified: Secondary | ICD-10-CM | POA: Diagnosis not present

## 2021-09-16 DIAGNOSIS — I5041 Acute combined systolic (congestive) and diastolic (congestive) heart failure: Secondary | ICD-10-CM | POA: Diagnosis not present

## 2021-09-16 DIAGNOSIS — Z7901 Long term (current) use of anticoagulants: Secondary | ICD-10-CM | POA: Diagnosis not present

## 2021-09-16 DIAGNOSIS — Z5181 Encounter for therapeutic drug level monitoring: Secondary | ICD-10-CM | POA: Diagnosis not present

## 2021-09-17 DIAGNOSIS — N390 Urinary tract infection, site not specified: Secondary | ICD-10-CM | POA: Diagnosis not present

## 2021-09-17 DIAGNOSIS — W19XXXA Unspecified fall, initial encounter: Secondary | ICD-10-CM | POA: Diagnosis not present

## 2021-09-18 DIAGNOSIS — N390 Urinary tract infection, site not specified: Secondary | ICD-10-CM | POA: Diagnosis not present

## 2021-09-18 DIAGNOSIS — B9620 Unspecified Escherichia coli [E. coli] as the cause of diseases classified elsewhere: Secondary | ICD-10-CM | POA: Diagnosis not present

## 2021-09-19 DIAGNOSIS — M25561 Pain in right knee: Secondary | ICD-10-CM | POA: Diagnosis not present

## 2021-09-19 DIAGNOSIS — F319 Bipolar disorder, unspecified: Secondary | ICD-10-CM | POA: Diagnosis not present

## 2021-09-19 DIAGNOSIS — R2681 Unsteadiness on feet: Secondary | ICD-10-CM | POA: Diagnosis not present

## 2021-09-19 DIAGNOSIS — I509 Heart failure, unspecified: Secondary | ICD-10-CM | POA: Diagnosis not present

## 2021-09-19 DIAGNOSIS — I4891 Unspecified atrial fibrillation: Secondary | ICD-10-CM | POA: Diagnosis not present

## 2021-09-21 DIAGNOSIS — I10 Essential (primary) hypertension: Secondary | ICD-10-CM | POA: Diagnosis not present

## 2021-09-23 ENCOUNTER — Ambulatory Visit (HOSPITAL_COMMUNITY): Payer: HMO | Admitting: Certified Registered"

## 2021-09-23 ENCOUNTER — Ambulatory Visit (HOSPITAL_BASED_OUTPATIENT_CLINIC_OR_DEPARTMENT_OTHER)
Admission: RE | Admit: 2021-09-23 | Discharge: 2021-09-23 | Disposition: A | Discharge status: Home / Self Care | Payer: HMO | Source: Home / Self Care | Attending: Cardiology | Admitting: Cardiology

## 2021-09-23 ENCOUNTER — Encounter (HOSPITAL_COMMUNITY)
Admission: RE | Disposition: A | Discharge status: Home / Self Care | Payer: Self-pay | Source: Home / Self Care | Attending: Cardiology

## 2021-09-23 ENCOUNTER — Ambulatory Visit (HOSPITAL_COMMUNITY)
Admission: RE | Admit: 2021-09-23 | Discharge: 2021-09-23 | Disposition: A | Discharge status: Home / Self Care | Payer: HMO | Attending: Cardiology | Admitting: Cardiology

## 2021-09-23 ENCOUNTER — Encounter (HOSPITAL_COMMUNITY): Payer: Self-pay | Admitting: Cardiology

## 2021-09-23 ENCOUNTER — Telehealth (HOSPITAL_COMMUNITY): Payer: Self-pay

## 2021-09-23 ENCOUNTER — Ambulatory Visit (HOSPITAL_BASED_OUTPATIENT_CLINIC_OR_DEPARTMENT_OTHER): Payer: HMO | Admitting: Certified Registered"

## 2021-09-23 ENCOUNTER — Other Ambulatory Visit: Payer: Self-pay

## 2021-09-23 DIAGNOSIS — Z833 Family history of diabetes mellitus: Secondary | ICD-10-CM | POA: Insufficient documentation

## 2021-09-23 DIAGNOSIS — Z8249 Family history of ischemic heart disease and other diseases of the circulatory system: Secondary | ICD-10-CM | POA: Insufficient documentation

## 2021-09-23 DIAGNOSIS — I2510 Atherosclerotic heart disease of native coronary artery without angina pectoris: Secondary | ICD-10-CM | POA: Insufficient documentation

## 2021-09-23 DIAGNOSIS — Z8673 Personal history of transient ischemic attack (TIA), and cerebral infarction without residual deficits: Secondary | ICD-10-CM | POA: Insufficient documentation

## 2021-09-23 DIAGNOSIS — Z950 Presence of cardiac pacemaker: Secondary | ICD-10-CM | POA: Diagnosis not present

## 2021-09-23 DIAGNOSIS — I4891 Unspecified atrial fibrillation: Secondary | ICD-10-CM

## 2021-09-23 DIAGNOSIS — Z7984 Long term (current) use of oral hypoglycemic drugs: Secondary | ICD-10-CM | POA: Diagnosis not present

## 2021-09-23 DIAGNOSIS — Z993 Dependence on wheelchair: Secondary | ICD-10-CM | POA: Diagnosis not present

## 2021-09-23 DIAGNOSIS — I480 Paroxysmal atrial fibrillation: Secondary | ICD-10-CM | POA: Diagnosis not present

## 2021-09-23 DIAGNOSIS — Z6832 Body mass index (BMI) 32.0-32.9, adult: Secondary | ICD-10-CM | POA: Insufficient documentation

## 2021-09-23 DIAGNOSIS — N179 Acute kidney failure, unspecified: Secondary | ICD-10-CM | POA: Diagnosis not present

## 2021-09-23 DIAGNOSIS — I10 Essential (primary) hypertension: Secondary | ICD-10-CM

## 2021-09-23 DIAGNOSIS — Z87891 Personal history of nicotine dependence: Secondary | ICD-10-CM | POA: Insufficient documentation

## 2021-09-23 DIAGNOSIS — I4819 Other persistent atrial fibrillation: Secondary | ICD-10-CM | POA: Insufficient documentation

## 2021-09-23 DIAGNOSIS — N183 Chronic kidney disease, stage 3 unspecified: Secondary | ICD-10-CM | POA: Diagnosis not present

## 2021-09-23 DIAGNOSIS — I5022 Chronic systolic (congestive) heart failure: Secondary | ICD-10-CM | POA: Insufficient documentation

## 2021-09-23 DIAGNOSIS — E1122 Type 2 diabetes mellitus with diabetic chronic kidney disease: Secondary | ICD-10-CM | POA: Insufficient documentation

## 2021-09-23 DIAGNOSIS — F319 Bipolar disorder, unspecified: Secondary | ICD-10-CM | POA: Diagnosis not present

## 2021-09-23 DIAGNOSIS — I340 Nonrheumatic mitral (valve) insufficiency: Secondary | ICD-10-CM

## 2021-09-23 DIAGNOSIS — E1151 Type 2 diabetes mellitus with diabetic peripheral angiopathy without gangrene: Secondary | ICD-10-CM | POA: Diagnosis not present

## 2021-09-23 DIAGNOSIS — E039 Hypothyroidism, unspecified: Secondary | ICD-10-CM | POA: Diagnosis not present

## 2021-09-23 DIAGNOSIS — E119 Type 2 diabetes mellitus without complications: Secondary | ICD-10-CM

## 2021-09-23 DIAGNOSIS — I5043 Acute on chronic combined systolic (congestive) and diastolic (congestive) heart failure: Secondary | ICD-10-CM | POA: Diagnosis not present

## 2021-09-23 DIAGNOSIS — I130 Hypertensive heart and chronic kidney disease with heart failure and stage 1 through stage 4 chronic kidney disease, or unspecified chronic kidney disease: Secondary | ICD-10-CM | POA: Insufficient documentation

## 2021-09-23 DIAGNOSIS — E785 Hyperlipidemia, unspecified: Secondary | ICD-10-CM | POA: Diagnosis not present

## 2021-09-23 DIAGNOSIS — I083 Combined rheumatic disorders of mitral, aortic and tricuspid valves: Secondary | ICD-10-CM

## 2021-09-23 DIAGNOSIS — E669 Obesity, unspecified: Secondary | ICD-10-CM | POA: Insufficient documentation

## 2021-09-23 DIAGNOSIS — R2681 Unsteadiness on feet: Secondary | ICD-10-CM | POA: Diagnosis not present

## 2021-09-23 DIAGNOSIS — Z952 Presence of prosthetic heart valve: Secondary | ICD-10-CM | POA: Insufficient documentation

## 2021-09-23 DIAGNOSIS — K219 Gastro-esophageal reflux disease without esophagitis: Secondary | ICD-10-CM | POA: Insufficient documentation

## 2021-09-23 HISTORY — DX: Chronic kidney disease, unspecified: N18.9

## 2021-09-23 HISTORY — PX: TEE WITHOUT CARDIOVERSION: SHX5443

## 2021-09-23 HISTORY — PX: CARDIOVERSION: SHX1299

## 2021-09-23 LAB — PROTIME-INR
INR: 2 — ABNORMAL HIGH (ref 0.8–1.2)
Prothrombin Time: 22.8 seconds — ABNORMAL HIGH (ref 11.4–15.2)

## 2021-09-23 LAB — GLUCOSE, CAPILLARY: Glucose-Capillary: 94 mg/dL (ref 70–99)

## 2021-09-23 SURGERY — CARDIOVERSION
Anesthesia: General

## 2021-09-23 MED ORDER — PROPOFOL 500 MG/50ML IV EMUL
INTRAVENOUS | Status: DC | PRN
Start: 2021-09-23 — End: 2021-09-23
  Administered 2021-09-23: 125 ug/kg/min via INTRAVENOUS

## 2021-09-23 MED ORDER — PHENYLEPHRINE 80 MCG/ML (10ML) SYRINGE FOR IV PUSH (FOR BLOOD PRESSURE SUPPORT)
PREFILLED_SYRINGE | INTRAVENOUS | Status: DC | PRN
  Administered 2021-09-23: 160 ug via INTRAVENOUS

## 2021-09-23 MED ORDER — SODIUM CHLORIDE 0.9 % IV SOLN: INTRAVENOUS | Status: DC

## 2021-09-23 MED ORDER — LIDOCAINE HCL (CARDIAC) PF 100 MG/5ML IV SOSY
PREFILLED_SYRINGE | INTRAVENOUS | Status: DC | PRN
  Administered 2021-09-23: 80 mg via INTRAVENOUS

## 2021-09-23 MED ORDER — BUTAMBEN-TETRACAINE-BENZOCAINE 2-2-14 % EX AERO
INHALATION_SPRAY | CUTANEOUS | Status: DC | PRN
  Administered 2021-09-23: 1 via TOPICAL

## 2021-09-23 MED ORDER — ETOMIDATE 2 MG/ML IV SOLN
INTRAVENOUS | Status: DC | PRN
  Administered 2021-09-23: 4 mg via INTRAVENOUS
  Administered 2021-09-23: 2 mg via INTRAVENOUS

## 2021-09-23 NOTE — Transfer of Care (Signed)
Immediate Anesthesia Transfer of Care Note ? ?Patient: [name] ? ?Procedure(s) Performed: CARDIOVERSION ?TRANSESOPHAGEAL ECHOCARDIOGRAM (TEE) ? ?Patient Location: PACU ? ?Anesthesia Type:MAC ? ?Level of Consciousness: drowsy and patient cooperative ? ?Airway & Oxygen Therapy: Patient Spontanous Breathing ? ?Post-op Assessment: Report given to RN and Post -op Vital signs reviewed and stable ? ?Post vital signs: Reviewed and stable ? ?Last Vitals:  ?Vitals Value Taken Time  ?BP    ?Temp    ?Pulse 59 09/23/21 1353  ?Resp 18 09/23/21 1353  ?SpO2 96 % 09/23/21 1353  ?Vitals shown include unvalidated device data. ? ?Last Pain:  ?Vitals:  ? 09/23/21 1211  ?PainSc: 0-No pain  ?   ? ?  ? ?Complications: No notable events documented. ?

## 2021-09-23 NOTE — CV Procedure (Signed)
Procedure: TEE ? ?Indication: Atrial fibrillation ? ?Sedation: Per anesthesiology ? ?Findings: Please see echo section for full report.  Mildly dilated left ventricle with normal wall thickness.  EF 35-40% with diffuse hypokinesis.  Normal RV size with mildly decreased systolic function.  Moderate left atrial enlargement, no LA appendage thrombus.  Mild right atrial enlargement.  No ASD/PFO by color doppler.  Trivial TR.  Mild-moderate mitral regurgitation.  Mechanical aortic valve with trivial regurgitation and mean gradient 6 mmHg (normal function).  Normal caliber thoracic aorta with minimal plaque.  ? ?May proceed with DCCV.  ? ?[name] ?09/23/2021 ?1:50 PM ? ?

## 2021-09-23 NOTE — Procedures (Signed)
Electrical Cardioversion Procedure Note ?[name] ?[id number] ?[birth year] ? ?Procedure: Electrical Cardioversion ?Indications:  Atrial Fibrillation ? ?Procedure Details ?Consent: Risks of procedure as well as the alternatives and risks of each were explained to the (patient/caregiver).  Consent for procedure obtained. ?Time Out: Verified patient identification, verified procedure, site/side was marked, verified correct patient position, special equipment/implants available, medications/allergies/relevent history reviewed, required imaging and test results available.  Performed ? ?Patient placed on cardiac monitor, pulse oximetry, supplemental oxygen as necessary.  ?Sedation given:  Etomidate per anesthesiology ?Pacer pads placed anterior and posterior chest. ? ?Cardioverted 1 time(s).  ?Cardioverted at 200J. ? ?Evaluation ?Findings: Post procedure EKG shows: NSR ?Complications: None ?Patient did tolerate procedure well. ? ? ?Loralie Champagne ?09/23/2021, 1:51 PM ? ? ? ?

## 2021-09-23 NOTE — Progress Notes (Signed)
?  Echocardiogram ?Echocardiogram Transesophageal has been performed. ? [name] ?09/23/2021, 2:07 PM ?

## 2021-09-23 NOTE — Telephone Encounter (Signed)
[facility] faxed stating ok for surgery but warfarin is no to be held.  ?

## 2021-09-23 NOTE — Anesthesia Postprocedure Evaluation (Signed)
Anesthesia Post Note ? ?Patient: [name] ? ?Procedure(s) Performed: CARDIOVERSION ?TRANSESOPHAGEAL ECHOCARDIOGRAM (TEE) ? ?  ? ?Patient location during evaluation: PACU ?Anesthesia Type: General ?Level of consciousness: awake and alert ?Pain management: pain level controlled ?Vital Signs Assessment: post-procedure vital signs reviewed and stable ?Respiratory status: spontaneous breathing, nonlabored ventilation, respiratory function stable and patient connected to nasal cannula oxygen ?Cardiovascular status: blood pressure returned to baseline and stable ?Postop Assessment: no apparent nausea or vomiting ?Anesthetic complications: no ? ? ?No notable events documented. ? ?Last Vitals:  ?Vitals:  ? 09/23/21 1410 09/23/21 1420  ?BP: (!) 114/52 (!) 117/56  ?Pulse: (!) 59 (!) 59  ?Resp: 16 16  ?Temp:    ?SpO2: 96% 97%  ?  ?Last Pain:  ?Vitals:  ? 09/23/21 1410  ?TempSrc:   ?PainSc: 0-No pain  ? ? ?  ?  ?  ?  ?  ?  ? ?Careli Luzader S ? ? ? ? ?

## 2021-09-23 NOTE — Discharge Instructions (Addendum)
Electrical Cardioversion Electrical cardioversion is the delivery of a jolt of electricity to restore a normal rhythm to the heart. A rhythm that is too fast or is not regular keeps the heart from pumping well. In this procedure, sticky patches or metal paddles are placed on the chest to deliver electricity to the heart from a device. This procedure may be done in an emergency if: There is low or no blood pressure as a result of the heart rhythm. Normal rhythm must be restored as fast as possible to protect the brain and heart from further damage. It may save a life. This may also be a scheduled procedure for irregular or fast heart rhythms that are not immediately life-threatening.  What can I expect after the procedure? Your blood pressure, heart rate, breathing rate, and blood oxygen level will be monitored until you leave the hospital or clinic. Your heart rhythm will be watched to make sure it does not change. You may have some redness on the skin where the shocks were given. Over the counter cortizone cream may be helpful.  Follow these instructions at home: Do not drive for 24 hours if you were given a sedative during your procedure. Take over-the-counter and prescription medicines only as told by your health care provider. Ask your health care provider how to check your pulse. Check it often. Rest for 48 hours after the procedure or as told by your health care provider. Avoid or limit your caffeine use as told by your health care provider. Keep all follow-up visits as told by your health care provider. This is important. Contact a health care provider if: You feel like your heart is beating too quickly or your pulse is not regular. You have a serious muscle cramp that does not go away. Get help right away if: You have discomfort in your chest. You are dizzy or you feel faint. You have trouble breathing or you are short of breath. Your speech is slurred. You have trouble moving an  arm or leg on one side of your body. Your fingers or toes turn cold or blue. Summary Electrical cardioversion is the delivery of a jolt of electricity to restore a normal rhythm to the heart. This procedure may be done right away in an emergency or may be a scheduled procedure if the condition is not an emergency. Generally, this is a safe procedure. After the procedure, check your pulse often as told by your health care provider. This information is not intended to replace advice given to you by your health care provider. Make sure you discuss any questions you have with your health care provider. Document Revised: 12/13/2018 Document Reviewed: 12/13/2018 Elsevier Patient Education  2020 [employer] TEE  YOU HAD AN CARDIAC PROCEDURE TODAY: Refer to the procedure report and other information in the discharge instructions given to you for any specific questions about what was found during the examination. If this information does not answer your questions, please call [facility] office at [phone] to clarify.   DIET: Your first meal following the procedure should be a light meal and then it is ok to progress to your normal diet. A half-sandwich or bowl of soup is an example of a good first meal. Heavy or fried foods are harder to digest and may make you feel nauseous or bloated. Drink plenty of fluids but you should avoid alcoholic beverages for 24 hours. If you had a esophageal dilation, please see attached instructions for diet.   ACTIVITY: Your   care partner should take you home directly after the procedure. You should plan to take it easy, moving slowly for the rest of the day. You can resume normal activity the day after the procedure however YOU SHOULD NOT DRIVE, use power tools, machinery or perform tasks that involve climbing or major physical exertion for 24 hours (because of the sedation medicines used during the test).   SYMPTOMS TO REPORT IMMEDIATELY: A cardiologist can be  reached at any hour. Please call [phone] for any of the following symptoms:  Vomiting of blood or coffee ground material  New, significant abdominal pain  New, significant chest pain or pain under the shoulder blades  Painful or persistently difficult swallowing  New shortness of breath  Black, tarry-looking or red, bloody stools  FOLLOW UP:  Please also call with any specific questions about appointments or follow up tests.   

## 2021-09-23 NOTE — Interval H&P Note (Signed)
History and Physical Interval Note: ? ?09/23/2021 ?1:16 PM ? ?[name]  has presented today for surgery, with the diagnosis of AFIB.  The various methods of treatment have been discussed with the patient and family. After consideration of risks, benefits and other options for treatment, the patient has consented to  Procedure(s): ?CARDIOVERSION (N/A) as a surgical intervention.  The patient's history has been reviewed, patient examined, no change in status, stable for surgery.  I have reviewed the patient's chart and labs.  Questions were answered to the patient's satisfaction.   ? ? ?[name] Aundra Dubin ? ? ?

## 2021-09-23 NOTE — Anesthesia Preprocedure Evaluation (Signed)
Anesthesia Evaluation  ?Patient identified by MRN, date of birth, ID band ?Patient awake ? ? ? ?Reviewed: ?Allergy & Precautions, NPO status , Patient's Chart, lab work & pertinent test results ? ?Airway ?Mallampati: II ? ?TM Distance: >3 FB ?Neck ROM: Full ? ? ? Dental ?no notable dental hx. ? ?  ?Pulmonary ?neg pulmonary ROS, former smoker,  ?  ?Pulmonary exam normal ?breath sounds clear to auscultation ? ? ? ? ? ? Cardiovascular ?hypertension, + Peripheral Vascular Disease  ?+ dysrhythmias Atrial Fibrillation + pacemaker  ?Rhythm:Irregular Rate:Normal ? ?S/P AVR ?  ?Neuro/Psych ?negative neurological ROS ? negative psych ROS  ? GI/Hepatic ?Neg liver ROS, GERD  ,  ?Endo/Other  ?diabetesHypothyroidism  ? Renal/GU ?Renal InsufficiencyRenal disease  ?negative genitourinary ?  ?Musculoskeletal ?negative musculoskeletal ROS ?(+)  ? Abdominal ?  ?Peds ?negative pediatric ROS ?(+)  Hematology ?negative hematology ROS ?(+)   ?Anesthesia Other Findings ? ? Reproductive/Obstetrics ?negative OB ROS ? ?  ? ? ? ? ? ? ? ? ? ? ? ? ? ?  ?  ? ? ? ? ? ? ? ? ?Anesthesia Physical ?Anesthesia Plan ? ?ASA: 3 ? ?Anesthesia Plan: General  ? ?Post-op Pain Management: Minimal or no pain anticipated  ? ?Induction: Intravenous ? ?PONV Risk Score and Plan: 3 and Treatment may vary due to age or medical condition ? ?Airway Management Planned: Mask ? ?Additional Equipment:  ? ?Intra-op Plan:  ? ?Post-operative Plan:  ? ?Informed Consent: I have reviewed the patients History and Physical, chart, labs and discussed the procedure including the risks, benefits and alternatives for the proposed anesthesia with the patient or authorized representative who has indicated his/her understanding and acceptance.  ? ? ? ?Dental advisory given ? ?Plan Discussed with: CRNA and Surgeon ? ?Anesthesia Plan Comments:   ? ? ? ? ? ? ?Anesthesia Quick Evaluation ? ?

## 2021-09-24 ENCOUNTER — Encounter (HOSPITAL_COMMUNITY): Payer: Self-pay | Admitting: Cardiology

## 2021-09-24 DIAGNOSIS — I4891 Unspecified atrial fibrillation: Secondary | ICD-10-CM | POA: Diagnosis not present

## 2021-09-24 DIAGNOSIS — I509 Heart failure, unspecified: Secondary | ICD-10-CM | POA: Diagnosis not present

## 2021-09-24 DIAGNOSIS — R2681 Unsteadiness on feet: Secondary | ICD-10-CM | POA: Diagnosis not present

## 2021-09-24 DIAGNOSIS — M25561 Pain in right knee: Secondary | ICD-10-CM | POA: Diagnosis not present

## 2021-09-24 DIAGNOSIS — F319 Bipolar disorder, unspecified: Secondary | ICD-10-CM | POA: Diagnosis not present

## 2021-09-27 DIAGNOSIS — H25812 Combined forms of age-related cataract, left eye: Secondary | ICD-10-CM | POA: Diagnosis not present

## 2021-09-30 ENCOUNTER — Ambulatory Visit: Payer: HMO | Admitting: Interventional Cardiology

## 2021-09-30 DIAGNOSIS — H269 Unspecified cataract: Secondary | ICD-10-CM | POA: Diagnosis not present

## 2021-09-30 DIAGNOSIS — E538 Deficiency of other specified B group vitamins: Secondary | ICD-10-CM | POA: Diagnosis not present

## 2021-09-30 DIAGNOSIS — E039 Hypothyroidism, unspecified: Secondary | ICD-10-CM | POA: Diagnosis not present

## 2021-09-30 DIAGNOSIS — E559 Vitamin D deficiency, unspecified: Secondary | ICD-10-CM | POA: Diagnosis not present

## 2021-09-30 DIAGNOSIS — E1159 Type 2 diabetes mellitus with other circulatory complications: Secondary | ICD-10-CM | POA: Diagnosis not present

## 2021-09-30 DIAGNOSIS — I5041 Acute combined systolic (congestive) and diastolic (congestive) heart failure: Secondary | ICD-10-CM | POA: Diagnosis not present

## 2021-09-30 DIAGNOSIS — F319 Bipolar disorder, unspecified: Secondary | ICD-10-CM | POA: Diagnosis not present

## 2021-09-30 DIAGNOSIS — E785 Hyperlipidemia, unspecified: Secondary | ICD-10-CM | POA: Diagnosis not present

## 2021-09-30 DIAGNOSIS — Z952 Presence of prosthetic heart valve: Secondary | ICD-10-CM | POA: Diagnosis not present

## 2021-09-30 DIAGNOSIS — I4891 Unspecified atrial fibrillation: Secondary | ICD-10-CM | POA: Diagnosis not present

## 2021-09-30 DIAGNOSIS — D638 Anemia in other chronic diseases classified elsewhere: Secondary | ICD-10-CM | POA: Diagnosis not present

## 2021-09-30 DIAGNOSIS — Z5181 Encounter for therapeutic drug level monitoring: Secondary | ICD-10-CM | POA: Diagnosis not present

## 2021-10-01 DIAGNOSIS — E119 Type 2 diabetes mellitus without complications: Secondary | ICD-10-CM | POA: Diagnosis not present

## 2021-10-01 DIAGNOSIS — E559 Vitamin D deficiency, unspecified: Secondary | ICD-10-CM | POA: Diagnosis not present

## 2021-10-01 DIAGNOSIS — D519 Vitamin B12 deficiency anemia, unspecified: Secondary | ICD-10-CM | POA: Diagnosis not present

## 2021-10-02 DIAGNOSIS — I509 Heart failure, unspecified: Secondary | ICD-10-CM | POA: Diagnosis not present

## 2021-10-02 DIAGNOSIS — F319 Bipolar disorder, unspecified: Secondary | ICD-10-CM | POA: Diagnosis not present

## 2021-10-02 DIAGNOSIS — E119 Type 2 diabetes mellitus without complications: Secondary | ICD-10-CM | POA: Diagnosis not present

## 2021-10-02 DIAGNOSIS — R2681 Unsteadiness on feet: Secondary | ICD-10-CM | POA: Diagnosis not present

## 2021-10-02 DIAGNOSIS — I4891 Unspecified atrial fibrillation: Secondary | ICD-10-CM | POA: Diagnosis not present

## 2021-10-02 DIAGNOSIS — M25561 Pain in right knee: Secondary | ICD-10-CM | POA: Diagnosis not present

## 2021-10-07 DIAGNOSIS — E1159 Type 2 diabetes mellitus with other circulatory complications: Secondary | ICD-10-CM | POA: Diagnosis not present

## 2021-10-07 DIAGNOSIS — Z952 Presence of prosthetic heart valve: Secondary | ICD-10-CM | POA: Diagnosis not present

## 2021-10-07 DIAGNOSIS — E039 Hypothyroidism, unspecified: Secondary | ICD-10-CM | POA: Diagnosis not present

## 2021-10-07 DIAGNOSIS — I4891 Unspecified atrial fibrillation: Secondary | ICD-10-CM | POA: Diagnosis not present

## 2021-10-07 DIAGNOSIS — H269 Unspecified cataract: Secondary | ICD-10-CM | POA: Diagnosis not present

## 2021-10-07 DIAGNOSIS — Z5181 Encounter for therapeutic drug level monitoring: Secondary | ICD-10-CM | POA: Diagnosis not present

## 2021-10-09 DIAGNOSIS — M25561 Pain in right knee: Secondary | ICD-10-CM | POA: Diagnosis not present

## 2021-10-09 DIAGNOSIS — R2681 Unsteadiness on feet: Secondary | ICD-10-CM | POA: Diagnosis not present

## 2021-10-09 DIAGNOSIS — I509 Heart failure, unspecified: Secondary | ICD-10-CM | POA: Diagnosis not present

## 2021-10-09 DIAGNOSIS — F319 Bipolar disorder, unspecified: Secondary | ICD-10-CM | POA: Diagnosis not present

## 2021-10-09 DIAGNOSIS — I4891 Unspecified atrial fibrillation: Secondary | ICD-10-CM | POA: Diagnosis not present

## 2021-10-10 DIAGNOSIS — I10 Essential (primary) hypertension: Secondary | ICD-10-CM | POA: Diagnosis not present

## 2021-10-14 DIAGNOSIS — Z5181 Encounter for therapeutic drug level monitoring: Secondary | ICD-10-CM | POA: Diagnosis not present

## 2021-10-14 DIAGNOSIS — I4891 Unspecified atrial fibrillation: Secondary | ICD-10-CM | POA: Diagnosis not present

## 2021-10-14 DIAGNOSIS — Z7901 Long term (current) use of anticoagulants: Secondary | ICD-10-CM | POA: Diagnosis not present

## 2021-10-16 DIAGNOSIS — I4891 Unspecified atrial fibrillation: Secondary | ICD-10-CM | POA: Diagnosis not present

## 2021-10-16 DIAGNOSIS — I509 Heart failure, unspecified: Secondary | ICD-10-CM | POA: Diagnosis not present

## 2021-10-16 DIAGNOSIS — R2681 Unsteadiness on feet: Secondary | ICD-10-CM | POA: Diagnosis not present

## 2021-10-16 DIAGNOSIS — F319 Bipolar disorder, unspecified: Secondary | ICD-10-CM | POA: Diagnosis not present

## 2021-10-16 DIAGNOSIS — M25561 Pain in right knee: Secondary | ICD-10-CM | POA: Diagnosis not present

## 2021-10-21 LAB — ECHO TEE
AV Mean grad: 6 mmHg
AV Peak grad: 12.3 mmHg
Ao pk vel: 1.75 m/s
MV M vel: 4.29 m/s
MV Peak grad: 73.6 mmHg
Radius: 0.4 cm

## 2021-10-22 DIAGNOSIS — I4891 Unspecified atrial fibrillation: Secondary | ICD-10-CM | POA: Diagnosis not present

## 2021-10-22 DIAGNOSIS — Z5181 Encounter for therapeutic drug level monitoring: Secondary | ICD-10-CM | POA: Diagnosis not present

## 2021-10-22 DIAGNOSIS — R42 Dizziness and giddiness: Secondary | ICD-10-CM | POA: Diagnosis not present

## 2021-10-22 DIAGNOSIS — M2391 Unspecified internal derangement of right knee: Secondary | ICD-10-CM | POA: Diagnosis not present

## 2021-10-22 DIAGNOSIS — N1831 Chronic kidney disease, stage 3a: Secondary | ICD-10-CM | POA: Diagnosis not present

## 2021-10-22 NOTE — Progress Notes (Signed)
HF Clinic Note  Primary Care: Dr Charlett Blake  Primary Cardiologist: Dr Tamala Julian  HF Cardiology: Dr. Aundra Dubin  HPI: [name] is a 71 y.o. female with a hx of bicuspid AV/AS with ascending aortic aneurysm s/p resection/grafting of ascending aortic aneurysm and placement of St. Jude mechanical AV conduit/Bentall 2009, persistent atrial fib, bradycardia s/p [employer]  PPM implantation 12/2019, CVAs, multiple falls with prior craniotomy for subdural hematoma, prior chronic CVAs on MRI 2021, bipolar disorder, anemia, depression, debilitation (wheelchair bound), DM, HLD, HTN, mild carotid artery disease 2021, back pain, obesity, memory impairment, and chronic HFrEF.   Admitted 06/2021 with increased shortness of breath in the setting of acute HFrEF. Diuresed with IV lasix. Echo repeated EF 25-30%. Had cath with patent coronaries. Started on [place]. Discharged to [facility].   She remains in atrial fibrillation today. She appears to have gone back into atrial fibrillation some time between 5/22 and 12/22 after amiodarone was stopped. She has had 3 bad falls (12/21, 2/22, 4/22) resulting in subdural hematomas.  She is, however, still on warfarin.    Patient returns for followup of CHF and AF.  Weight is up 3 lbs.  She has been walking more, using rollator.  No dyspnea walking several hundred feet with rollator. No orthopnea/PND. No chest pain.  No lightheadedness or recent falls.  INR is therapeutic today and on review of SNF INRs, has been therapeutic since 07/16/21.   ECG (personally reviewed): atrial fibrillation, occasional v-pacing  Labs (2/23): K 4.1, creatinine 1.68, hgb 10.8 Labs (3/23): K 4.2, creatinine 1.69, hgb 10.8, LFTs normal, TSH normal.   PMH: 1. Bicuspid aortic valve with ascending aortic aneurysm: S/p St Jude mechanical aortic valve with Bentall procedure in 2009 2. Atrial fibrillation: Persistent. DCCV to NSR in 12/21.  Had maintained NSR on amiodarone but was  stopped.  3. Bradycardia: [employer] PPM in 3/21.  4. CVAs: Now wheelchair-bound.  5. Multiple falls: 12/21 with subdural hematoma requiring craniotomy, 2/22 with subdural, 4/22 with subdural.  6. Bipolar disorder 7. Dementia 8. Hypothyroidism 9. CKD stage 3 10. Chronic systolic CHF: Nonischemic cardiomyopathy.  - Echo (11/22): EF 40-45%.  - LHC/RHC (2/23): No CAD; mean RA 12, PA 45/22, PCWP mean 25, CI 3.5 - Echo (2/23): EF 25-30%, global HK, RV normal, mild MR, St Jude mechanical AVR with normal function.  11. Hyperlipidemia 12. CKD stage 3   Review of Systems: All systems reviewed and negative except as per HPI.   Current Outpatient Medications  Medication Sig Dispense Refill   albuterol (VENTOLIN HFA) 108 (90 Base) MCG/ACT inhaler Inhale 2 puffs into the lungs at bedtime.     amiodarone (PACERONE) 200 MG tablet Take 200 mg by mouth daily.     amoxicillin-clavulanate (AUGMENTIN) 500-125 MG tablet Take 1 tablet by mouth 2 (two) times daily.     carvedilol (COREG) 12.5 MG tablet Take 1 tablet (12.5 mg total) by mouth 2 (two) times daily with a meal.     cholecalciferol (VITAMIN D) 25 MCG tablet Take 1 tablet (1,000 Units total) by mouth daily.     dapagliflozin propanediol (FARXIGA) 10 MG TABS tablet Take 1 tablet (10 mg total) by mouth daily. 30 tablet 0   diclofenac Sodium (VOLTAREN) 1 % GEL Apply 2 g topically 2 (two) times daily. Apply to Knee     fenofibrate 160 MG tablet Take 1 tablet (160 mg total) by mouth daily.     fexofenadine (ALLEGRA) 60 MG  tablet Take 60 mg by mouth daily.     furosemide (LASIX) 40 MG tablet Take 1 tablet (40 mg total) by mouth every morning AND 0.5 tablets (20 mg total) every evening. 90 tablet 5   lamoTRIgine (LAMICTAL) 100 MG tablet Take 100 mg by mouth daily.     lamoTRIgine (LAMICTAL) 200 MG tablet Take 200 mg by mouth at bedtime.     levothyroxine (SYNTHROID) 25 MCG tablet TAKE 1 TABLET BY MOUTH DAILY BEFORE BREAKFAST. 90 tablet 0    lidocaine 4 % Place 1 patch onto the skin daily. Apply every morning  Off every night     lip balm (CARMEX) ointment Apply topically as needed for lip care. (Patient not taking: Reported on 09/20/2021) 7 g 0   melatonin 5 MG TABS Take 10 mg by mouth at bedtime.     Menthol, Topical Analgesic, (BIOFREEZE PROFESSIONAL) 5 % GEL Apply 1 application. topically 2 (two) times daily. Apply thin layer to right knee for pain     miconazole (MICOTIN) 2 % powder Apply 1 application. topically as needed for itching (vaginal itching and odor (keep at bedside)).     Multiple Vitamin (MULTIVITAMIN) tablet Take 1 tablet by mouth daily. With Iron 18-400 mg mcg     ondansetron (ZOFRAN) 4 MG tablet Take 1 tablet (4 mg total) by mouth every 6 (six) hours as needed for nausea. (Patient not taking: Reported on 09/20/2021) 20 tablet 0   pantoprazole (PROTONIX) 40 MG tablet Take 1 tablet (40 mg total) by mouth daily. (Patient taking differently: Take 20 mg by mouth daily.)     potassium chloride SA (KLOR-CON M) 20 MEQ tablet Take 1 tablet (20 mEq total) by mouth daily.     rosuvastatin (CRESTOR) 40 MG tablet Take 40 mg by mouth daily.     sacubitril-valsartan (ENTRESTO) 49-51 MG Take 1 tablet by mouth 2 (two) times daily. 60 tablet 11   spironolactone (ALDACTONE) 25 MG tablet Take 1 tablet (25 mg total) by mouth daily. 30 tablet 11   traZODone (DESYREL) 50 MG tablet Take 100 mg by mouth at bedtime.     venlafaxine XR (EFFEXOR XR) 75 MG 24 hr capsule Take 1 capsule (75 mg total) by mouth daily with breakfast. 90 capsule 1   vitamin B-12 (CYANOCOBALAMIN) 1000 MCG tablet Take 1,000 mcg by mouth daily.     warfarin (COUMADIN) 2 MG tablet Take 2 mg by mouth daily.     No current facility-administered medications for this visit.    Allergies  Allergen Reactions   Mucinex [Guaifenesin Er] Other (See Comments)    Severe headaches    Keflex [Cephalexin] Other (See Comments)    Headaches and dizziness   Keflex [Cephalexin]  Other (See Comments)    Headaches and/or dizziness- "allergic," per MAR   Mucinex [Guaifenesin Er] Other (See Comments)    Severe headaches- "allergic," per MAR   Sulfa Antibiotics Other (See Comments)    headaches   Sulfa Antibiotics Other (See Comments)    Headaches- "allergic," per MAR    Sulfonamide Derivatives Other (See Comments)    Headaches       Social History   Socioeconomic History   Marital status: Married    Spouse name: Not on file   Number of children: Not on file   Years of education: Not on file   Highest education level: Not on file  Occupational History   Not on file  Tobacco Use   Smoking status: Former  Types: Cigarettes    Quit date: 05/26/1990    Years since quitting: 31.4   Smokeless tobacco: Never  Vaping Use   Vaping Use: Never used  Substance and Sexual Activity   Alcohol use: Yes    Alcohol/week: 0.0 standard drinks    Comment: rare   Drug use: No   Sexual activity: Not on file  Other Topics Concern   Not on file  Social History Narrative   ** Merged History Encounter **       Social Determinants of Health   Financial Resource Strain: Not on file  Food Insecurity: Not on file  Transportation Needs: Not on file  Physical Activity: Not on file  Stress: Not on file  Social Connections: Not on file  Intimate Partner Violence: Not on file      Family History  Problem Relation Age of Onset   Scoliosis Mother    Arthritis Mother        Rheumatoid   Aneurysm Mother        heart   Alcohol abuse Father    Depression Sister    Depression Brother    Alcohol abuse Brother    Cancer Maternal Grandmother        colon   Diabetes Maternal Grandfather    Heart disease Maternal Grandfather    Alcohol abuse Maternal Grandfather    Depression Paternal Grandmother    Diabetes Paternal Grandmother    Depression Paternal Grandfather    Diabetes Paternal Grandfather    Depression Sister    Alcohol abuse Brother    Depression Brother     Heart disease Brother     There were no vitals filed for this visit.  Wt Readings from Last 3 Encounters:  09/23/21 98.2 kg (216 lb 6.4 oz)  09/10/21 98.2 kg (216 lb 6.4 oz)  08/14/21 96.8 kg (213 lb 6.4 oz)    PHYSICAL EXAM: General: NAD Neck: No JVD, no thyromegaly or thyroid nodule.  Lungs: Clear to auscultation bilaterally with normal respiratory effort. CV: Nondisplaced PMI.  Heart irregular S1/S2 with mechanical S2, no S3/S4, 1/6 SEM RUSB.  Trace left ankle edema.  No carotid bruit.  Normal pedal pulses.  Abdomen: Soft, nontender, no hepatosplenomegaly, no distention.  Skin: Intact without lesions or rashes.  Neurologic: Alert and oriented x 3.  Psych: Normal affect. Extremities: No clubbing or cyanosis.  HEENT: Normal.   ASSESSMENT & PLAN: 1. Chronic systolic CHF: Nonischemic cardiomyopathy.  Cath 06/2020 without significant coronary disease.  06/2020 echo with EF 25-30%, previous echo 03/2021 with EF 40-45%. She has a Chemical engineer PPM but rarely paces her RV.  Volume status better on higher Lasix dose, and it appears that she is more mobile.  NYHA class II-III symptoms.  - Continue Lasix 40 qam/20 qpm.  BMET today.  - Continue carvedilol 12.5 mg twice a day  - Increase Entresto to 49/51 mg twice a day, BMET 10 days.  - Continue spironolactone 25 mg daily.  - Continue Farxiga 10 mg daily  2. CKD Stage III: BMET today.  3. Atrial fibrillation: Persistent.  Patient was in NSR in 5/22 but was in atrial fibrillation in 2/23 and still in AF currently. Her worsening HF may be due at least in part to development of persistent AF.  I think we should attempt to get her back into NSR.  She had maintained NSR on amiodarone in the past.  Amiodarone was restarted at last appointment.   - Continue amiodarone 200 mg  daily.  Check LFTs today.   - Continue warfarin. INR is therapeutic today and has been therapeutic now since 07/16/21 based on readings from SNF.  I will arrange for DCCV.   We discussed risks/benefits and she agrees to procedure.  Will get stat INR the morning of the DCCV.  She will not need a TEE given therapeutic INRs.  4. Mechanical Aortic Valve: On warfarin.  Stable valve on 2/23 echo.  5. Tachy/Brady Syndrome: [employer] PPM, paces RV 5% of time. Suspect she would not significantly benefit from CRT upgrade.  6. Falls: Frequent in past with SDH.  Now in SNF, seems to be doing better with no recent falls.  She has remained on warfarin given mechanical AoV.   Followup with APP 6 wks.    Dale 10/22/2021

## 2021-10-23 ENCOUNTER — Encounter (HOSPITAL_COMMUNITY): Payer: Self-pay

## 2021-10-23 ENCOUNTER — Ambulatory Visit (HOSPITAL_COMMUNITY)
Admission: RE | Admit: 2021-10-23 | Discharge: 2021-10-23 | Disposition: A | Discharge status: Home / Self Care | Payer: HMO | Source: Ambulatory Visit | Attending: Family Medicine | Admitting: Family Medicine

## 2021-10-23 VITALS — BP 110/56 | HR 60 | Wt 218.0 lb

## 2021-10-23 DIAGNOSIS — Z952 Presence of prosthetic heart valve: Secondary | ICD-10-CM | POA: Diagnosis not present

## 2021-10-23 DIAGNOSIS — N1831 Chronic kidney disease, stage 3a: Secondary | ICD-10-CM | POA: Diagnosis not present

## 2021-10-23 DIAGNOSIS — D649 Anemia, unspecified: Secondary | ICD-10-CM | POA: Insufficient documentation

## 2021-10-23 DIAGNOSIS — M549 Dorsalgia, unspecified: Secondary | ICD-10-CM | POA: Insufficient documentation

## 2021-10-23 DIAGNOSIS — Z7984 Long term (current) use of oral hypoglycemic drugs: Secondary | ICD-10-CM | POA: Insufficient documentation

## 2021-10-23 DIAGNOSIS — Z79899 Other long term (current) drug therapy: Secondary | ICD-10-CM | POA: Insufficient documentation

## 2021-10-23 DIAGNOSIS — I428 Other cardiomyopathies: Secondary | ICD-10-CM | POA: Diagnosis not present

## 2021-10-23 DIAGNOSIS — Z8249 Family history of ischemic heart disease and other diseases of the circulatory system: Secondary | ICD-10-CM | POA: Insufficient documentation

## 2021-10-23 DIAGNOSIS — Z8679 Personal history of other diseases of the circulatory system: Secondary | ICD-10-CM | POA: Insufficient documentation

## 2021-10-23 DIAGNOSIS — Z8673 Personal history of transient ischemic attack (TIA), and cerebral infarction without residual deficits: Secondary | ICD-10-CM | POA: Insufficient documentation

## 2021-10-23 DIAGNOSIS — I4819 Other persistent atrial fibrillation: Secondary | ICD-10-CM | POA: Insufficient documentation

## 2021-10-23 DIAGNOSIS — Z7901 Long term (current) use of anticoagulants: Secondary | ICD-10-CM | POA: Insufficient documentation

## 2021-10-23 DIAGNOSIS — I2510 Atherosclerotic heart disease of native coronary artery without angina pectoris: Secondary | ICD-10-CM | POA: Diagnosis not present

## 2021-10-23 DIAGNOSIS — E1122 Type 2 diabetes mellitus with diabetic chronic kidney disease: Secondary | ICD-10-CM | POA: Diagnosis not present

## 2021-10-23 DIAGNOSIS — I5043 Acute on chronic combined systolic (congestive) and diastolic (congestive) heart failure: Secondary | ICD-10-CM | POA: Diagnosis not present

## 2021-10-23 DIAGNOSIS — E785 Hyperlipidemia, unspecified: Secondary | ICD-10-CM | POA: Diagnosis not present

## 2021-10-23 DIAGNOSIS — I4891 Unspecified atrial fibrillation: Secondary | ICD-10-CM

## 2021-10-23 DIAGNOSIS — R000 Tachycardia, unspecified: Secondary | ICD-10-CM | POA: Insufficient documentation

## 2021-10-23 DIAGNOSIS — F319 Bipolar disorder, unspecified: Secondary | ICD-10-CM | POA: Diagnosis not present

## 2021-10-23 DIAGNOSIS — Z993 Dependence on wheelchair: Secondary | ICD-10-CM | POA: Insufficient documentation

## 2021-10-23 DIAGNOSIS — Z818 Family history of other mental and behavioral disorders: Secondary | ICD-10-CM | POA: Insufficient documentation

## 2021-10-23 DIAGNOSIS — Z6833 Body mass index (BMI) 33.0-33.9, adult: Secondary | ICD-10-CM | POA: Insufficient documentation

## 2021-10-23 DIAGNOSIS — Z950 Presence of cardiac pacemaker: Secondary | ICD-10-CM | POA: Diagnosis not present

## 2021-10-23 DIAGNOSIS — N183 Chronic kidney disease, stage 3 unspecified: Secondary | ICD-10-CM | POA: Diagnosis not present

## 2021-10-23 DIAGNOSIS — E669 Obesity, unspecified: Secondary | ICD-10-CM | POA: Insufficient documentation

## 2021-10-23 DIAGNOSIS — Z833 Family history of diabetes mellitus: Secondary | ICD-10-CM | POA: Insufficient documentation

## 2021-10-23 DIAGNOSIS — I5022 Chronic systolic (congestive) heart failure: Secondary | ICD-10-CM | POA: Diagnosis not present

## 2021-10-23 DIAGNOSIS — R42 Dizziness and giddiness: Secondary | ICD-10-CM | POA: Insufficient documentation

## 2021-10-23 DIAGNOSIS — I482 Chronic atrial fibrillation, unspecified: Secondary | ICD-10-CM

## 2021-10-23 DIAGNOSIS — Z9181 History of falling: Secondary | ICD-10-CM | POA: Insufficient documentation

## 2021-10-23 DIAGNOSIS — F0390 Unspecified dementia without behavioral disturbance: Secondary | ICD-10-CM | POA: Insufficient documentation

## 2021-10-23 DIAGNOSIS — Q231 Congenital insufficiency of aortic valve: Secondary | ICD-10-CM | POA: Insufficient documentation

## 2021-10-23 DIAGNOSIS — I495 Sick sinus syndrome: Secondary | ICD-10-CM | POA: Diagnosis not present

## 2021-10-23 DIAGNOSIS — I4892 Unspecified atrial flutter: Secondary | ICD-10-CM

## 2021-10-23 DIAGNOSIS — I130 Hypertensive heart and chronic kidney disease with heart failure and stage 1 through stage 4 chronic kidney disease, or unspecified chronic kidney disease: Secondary | ICD-10-CM | POA: Diagnosis not present

## 2021-10-23 DIAGNOSIS — R9431 Abnormal electrocardiogram [ECG] [EKG]: Secondary | ICD-10-CM | POA: Insufficient documentation

## 2021-10-23 LAB — BASIC METABOLIC PANEL
Anion gap: 9 (ref 5–15)
BUN: 47 mg/dL — ABNORMAL HIGH (ref 8–23)
CO2: 26 mmol/L (ref 22–32)
Calcium: 9.4 mg/dL (ref 8.9–10.3)
Chloride: 104 mmol/L (ref 98–111)
Creatinine, Ser: 1.86 mg/dL — ABNORMAL HIGH (ref 0.44–1.00)
GFR, Estimated: 29 mL/min — ABNORMAL LOW (ref >60)
Glucose, Bld: 95 mg/dL (ref 70–99)
Potassium: 4.1 mmol/L (ref 3.5–5.1)
Sodium: 139 mmol/L (ref 135–145)

## 2021-10-23 NOTE — Patient Instructions (Signed)
Thank you for coming in today  Labs were done today, if any labs are abnormal the clinic will call you No news is good news  At the [facility], you and your health needs are our priority. As part of our continuing mission to provide you with exceptional heart care, we have created designated Provider Care Teams. These Care Teams include your primary Cardiologist (physician) and Advanced Practice Providers (APPs- Physician Assistants and Nurse Practitioners) who all work together to provide you with the care you need, when you need it.   You may see any of the following providers on your designated Care Team at your next follow up: Dr Glori Bickers Dr Haynes Kerns, NP Lyda Jester, [place] [facility] [place], [place] Audry Riles, PharmD   Please be sure to bring in all your medications bottles to every appointment.   If you have any questions or concerns before your next appointment please send [place] a message through [place] or call our office at [phone].    TO LEAVE A MESSAGE FOR THE NURSE SELECT OPTION 2, PLEASE LEAVE A MESSAGE INCLUDING: YOUR NAME DATE OF BIRTH CALL BACK NUMBER REASON FOR CALL**this is important as we prioritize the call backs  YOU WILL RECEIVE A CALL BACK THE SAME DAY AS LONG AS YOU CALL BEFORE 4:00 PM

## 2021-10-24 ENCOUNTER — Telehealth (HOSPITAL_COMMUNITY): Payer: Self-pay | Admitting: Cardiology

## 2021-10-24 NOTE — Telephone Encounter (Signed)
Fax for repeat bmet in 10 days to [facility] unable to complete-multiple failed attempts   Verbal given to [place] with [facility]

## 2021-10-25 DIAGNOSIS — R296 Repeated falls: Secondary | ICD-10-CM | POA: Diagnosis not present

## 2021-10-25 DIAGNOSIS — R42 Dizziness and giddiness: Secondary | ICD-10-CM | POA: Diagnosis not present

## 2021-10-28 DIAGNOSIS — R296 Repeated falls: Secondary | ICD-10-CM | POA: Diagnosis not present

## 2021-10-28 DIAGNOSIS — E1159 Type 2 diabetes mellitus with other circulatory complications: Secondary | ICD-10-CM | POA: Diagnosis not present

## 2021-10-28 DIAGNOSIS — R42 Dizziness and giddiness: Secondary | ICD-10-CM | POA: Diagnosis not present

## 2021-10-28 DIAGNOSIS — D649 Anemia, unspecified: Secondary | ICD-10-CM | POA: Diagnosis not present

## 2021-11-01 DIAGNOSIS — H25811 Combined forms of age-related cataract, right eye: Secondary | ICD-10-CM | POA: Diagnosis not present

## 2021-11-01 DIAGNOSIS — R42 Dizziness and giddiness: Secondary | ICD-10-CM | POA: Diagnosis not present

## 2021-11-01 DIAGNOSIS — H2511 Age-related nuclear cataract, right eye: Secondary | ICD-10-CM | POA: Diagnosis not present

## 2021-11-11 DIAGNOSIS — R42 Dizziness and giddiness: Secondary | ICD-10-CM | POA: Diagnosis not present

## 2021-11-11 DIAGNOSIS — I5042 Chronic combined systolic (congestive) and diastolic (congestive) heart failure: Secondary | ICD-10-CM | POA: Diagnosis not present

## 2021-11-11 DIAGNOSIS — D649 Anemia, unspecified: Secondary | ICD-10-CM | POA: Diagnosis not present

## 2021-11-11 DIAGNOSIS — Z5181 Encounter for therapeutic drug level monitoring: Secondary | ICD-10-CM | POA: Diagnosis not present

## 2021-11-12 ENCOUNTER — Ambulatory Visit (INDEPENDENT_AMBULATORY_CARE_PROVIDER_SITE_OTHER): Payer: HMO

## 2021-11-12 DIAGNOSIS — I495 Sick sinus syndrome: Secondary | ICD-10-CM

## 2021-11-13 DIAGNOSIS — E119 Type 2 diabetes mellitus without complications: Secondary | ICD-10-CM | POA: Diagnosis not present

## 2021-11-13 DIAGNOSIS — Z79899 Other long term (current) drug therapy: Secondary | ICD-10-CM | POA: Diagnosis not present

## 2021-11-13 LAB — CUP PACEART REMOTE DEVICE CHECK
Battery Remaining Longevity: 156 mo
Battery Remaining Percentage: 100 %
Brady Statistic RA Percent Paced: 11 %
Brady Statistic RV Percent Paced: 8 %
Date Time Interrogation Session: 20230621023100
Implantable Lead Implant Date: 20210816
Implantable Lead Implant Date: 20210816
Implantable Lead Location: 753859
Implantable Lead Location: 753860
Implantable Lead Model: 7842
Implantable Lead Model: 7842
Implantable Lead Serial Number: 1013287
Implantable Lead Serial Number: 1091261
Implantable Pulse Generator Implant Date: 20210816
Lead Channel Impedance Value: 460 Ohm
Lead Channel Impedance Value: 684 Ohm
Lead Channel Setting Pacing Amplitude: 2.5 V
Lead Channel Setting Pacing Amplitude: 2.5 V
Lead Channel Setting Pacing Pulse Width: 0.4 ms
Lead Channel Setting Sensing Sensitivity: 2.5 mV
Pulse Gen Serial Number: 940432

## 2021-11-25 DIAGNOSIS — Z7901 Long term (current) use of anticoagulants: Secondary | ICD-10-CM | POA: Diagnosis not present

## 2021-11-25 DIAGNOSIS — Z5181 Encounter for therapeutic drug level monitoring: Secondary | ICD-10-CM | POA: Diagnosis not present

## 2021-11-25 DIAGNOSIS — I4891 Unspecified atrial fibrillation: Secondary | ICD-10-CM | POA: Diagnosis not present

## 2021-11-25 DIAGNOSIS — I5042 Chronic combined systolic (congestive) and diastolic (congestive) heart failure: Secondary | ICD-10-CM | POA: Diagnosis not present

## 2021-11-28 NOTE — Progress Notes (Signed)
Remote pacemaker transmission.   

## 2021-12-02 DIAGNOSIS — Z5181 Encounter for therapeutic drug level monitoring: Secondary | ICD-10-CM | POA: Diagnosis not present

## 2021-12-02 DIAGNOSIS — I4891 Unspecified atrial fibrillation: Secondary | ICD-10-CM | POA: Diagnosis not present

## 2021-12-02 DIAGNOSIS — Z952 Presence of prosthetic heart valve: Secondary | ICD-10-CM | POA: Diagnosis not present

## 2021-12-02 DIAGNOSIS — Z7901 Long term (current) use of anticoagulants: Secondary | ICD-10-CM | POA: Diagnosis not present

## 2021-12-03 DIAGNOSIS — Z8679 Personal history of other diseases of the circulatory system: Secondary | ICD-10-CM | POA: Diagnosis not present

## 2021-12-03 DIAGNOSIS — Z7901 Long term (current) use of anticoagulants: Secondary | ICD-10-CM | POA: Diagnosis not present

## 2021-12-03 DIAGNOSIS — R262 Difficulty in walking, not elsewhere classified: Secondary | ICD-10-CM | POA: Diagnosis not present

## 2021-12-03 DIAGNOSIS — N1831 Chronic kidney disease, stage 3a: Secondary | ICD-10-CM | POA: Diagnosis not present

## 2021-12-03 DIAGNOSIS — I4891 Unspecified atrial fibrillation: Secondary | ICD-10-CM | POA: Diagnosis not present

## 2021-12-03 DIAGNOSIS — E039 Hypothyroidism, unspecified: Secondary | ICD-10-CM | POA: Diagnosis not present

## 2021-12-03 DIAGNOSIS — M549 Dorsalgia, unspecified: Secondary | ICD-10-CM | POA: Diagnosis not present

## 2021-12-03 DIAGNOSIS — I5042 Chronic combined systolic (congestive) and diastolic (congestive) heart failure: Secondary | ICD-10-CM | POA: Diagnosis not present

## 2021-12-03 DIAGNOSIS — I272 Pulmonary hypertension, unspecified: Secondary | ICD-10-CM | POA: Diagnosis not present

## 2021-12-03 DIAGNOSIS — I150 Renovascular hypertension: Secondary | ICD-10-CM | POA: Diagnosis not present

## 2021-12-03 DIAGNOSIS — Z0189 Encounter for other specified special examinations: Secondary | ICD-10-CM | POA: Diagnosis not present

## 2021-12-03 DIAGNOSIS — Z952 Presence of prosthetic heart valve: Secondary | ICD-10-CM | POA: Diagnosis not present

## 2021-12-09 DIAGNOSIS — I4891 Unspecified atrial fibrillation: Secondary | ICD-10-CM | POA: Diagnosis not present

## 2021-12-09 DIAGNOSIS — Z7901 Long term (current) use of anticoagulants: Secondary | ICD-10-CM | POA: Diagnosis not present

## 2021-12-09 DIAGNOSIS — Z952 Presence of prosthetic heart valve: Secondary | ICD-10-CM | POA: Diagnosis not present

## 2021-12-09 DIAGNOSIS — Z5181 Encounter for therapeutic drug level monitoring: Secondary | ICD-10-CM | POA: Diagnosis not present

## 2021-12-18 DIAGNOSIS — M549 Dorsalgia, unspecified: Secondary | ICD-10-CM | POA: Diagnosis not present

## 2021-12-18 DIAGNOSIS — R42 Dizziness and giddiness: Secondary | ICD-10-CM | POA: Diagnosis not present

## 2022-01-09 DIAGNOSIS — G4700 Insomnia, unspecified: Secondary | ICD-10-CM | POA: Diagnosis not present

## 2022-01-09 DIAGNOSIS — M549 Dorsalgia, unspecified: Secondary | ICD-10-CM | POA: Diagnosis not present

## 2022-01-09 DIAGNOSIS — I150 Renovascular hypertension: Secondary | ICD-10-CM | POA: Diagnosis not present

## 2022-01-28 ENCOUNTER — Encounter (HOSPITAL_COMMUNITY): Payer: HMO | Admitting: Cardiology

## 2022-02-03 DIAGNOSIS — Z952 Presence of prosthetic heart valve: Secondary | ICD-10-CM | POA: Diagnosis not present

## 2022-02-03 DIAGNOSIS — Z5181 Encounter for therapeutic drug level monitoring: Secondary | ICD-10-CM | POA: Diagnosis not present

## 2022-02-03 DIAGNOSIS — I4891 Unspecified atrial fibrillation: Secondary | ICD-10-CM | POA: Diagnosis not present

## 2022-02-03 DIAGNOSIS — Z7901 Long term (current) use of anticoagulants: Secondary | ICD-10-CM | POA: Diagnosis not present

## 2022-02-11 ENCOUNTER — Ambulatory Visit (INDEPENDENT_AMBULATORY_CARE_PROVIDER_SITE_OTHER): Payer: HMO

## 2022-02-11 DIAGNOSIS — I495 Sick sinus syndrome: Secondary | ICD-10-CM

## 2022-02-12 LAB — CUP PACEART REMOTE DEVICE CHECK
Battery Remaining Longevity: 150 mo
Battery Remaining Percentage: 100 %
Brady Statistic RA Percent Paced: 18 %
Brady Statistic RV Percent Paced: 8 %
Date Time Interrogation Session: 20230920023100
Implantable Lead Implant Date: 20210816
Implantable Lead Implant Date: 20210816
Implantable Lead Location: 753859
Implantable Lead Location: 753860
Implantable Lead Model: 7842
Implantable Lead Model: 7842
Implantable Lead Serial Number: 1013287
Implantable Lead Serial Number: 1091261
Implantable Pulse Generator Implant Date: 20210816
Lead Channel Impedance Value: 496 Ohm
Lead Channel Impedance Value: 719 Ohm
Lead Channel Setting Pacing Amplitude: 2.5 V
Lead Channel Setting Pacing Amplitude: 2.5 V
Lead Channel Setting Pacing Pulse Width: 0.4 ms
Lead Channel Setting Sensing Sensitivity: 2.5 mV
Pulse Gen Serial Number: 940432

## 2022-02-17 DIAGNOSIS — Z7901 Long term (current) use of anticoagulants: Secondary | ICD-10-CM | POA: Diagnosis not present

## 2022-02-17 DIAGNOSIS — K219 Gastro-esophageal reflux disease without esophagitis: Secondary | ICD-10-CM | POA: Diagnosis not present

## 2022-02-17 DIAGNOSIS — I5042 Chronic combined systolic (congestive) and diastolic (congestive) heart failure: Secondary | ICD-10-CM | POA: Diagnosis not present

## 2022-02-17 DIAGNOSIS — I4891 Unspecified atrial fibrillation: Secondary | ICD-10-CM | POA: Diagnosis not present

## 2022-02-17 DIAGNOSIS — Z5181 Encounter for therapeutic drug level monitoring: Secondary | ICD-10-CM | POA: Diagnosis not present

## 2022-02-17 DIAGNOSIS — R059 Cough, unspecified: Secondary | ICD-10-CM | POA: Diagnosis not present

## 2022-02-25 NOTE — Progress Notes (Signed)
Remote pacemaker transmission.   

## 2022-02-27 DIAGNOSIS — Z7901 Long term (current) use of anticoagulants: Secondary | ICD-10-CM | POA: Diagnosis not present

## 2022-03-21 ENCOUNTER — Encounter (HOSPITAL_COMMUNITY): Payer: HMO | Admitting: Cardiology

## 2022-04-03 ENCOUNTER — Ambulatory Visit (HOSPITAL_COMMUNITY)
Admission: RE | Admit: 2022-04-03 | Discharge: 2022-04-03 | Disposition: A | Discharge status: Home / Self Care | Payer: HMO | Source: Ambulatory Visit | Attending: Cardiology | Admitting: Cardiology

## 2022-04-03 ENCOUNTER — Encounter (HOSPITAL_COMMUNITY): Payer: Self-pay | Admitting: Cardiology

## 2022-04-03 VITALS — BP 104/60 | HR 60 | Wt 234.0 lb

## 2022-04-03 DIAGNOSIS — I495 Sick sinus syndrome: Secondary | ICD-10-CM | POA: Insufficient documentation

## 2022-04-03 DIAGNOSIS — E785 Hyperlipidemia, unspecified: Secondary | ICD-10-CM | POA: Insufficient documentation

## 2022-04-03 DIAGNOSIS — I130 Hypertensive heart and chronic kidney disease with heart failure and stage 1 through stage 4 chronic kidney disease, or unspecified chronic kidney disease: Secondary | ICD-10-CM | POA: Insufficient documentation

## 2022-04-03 DIAGNOSIS — Z8673 Personal history of transient ischemic attack (TIA), and cerebral infarction without residual deficits: Secondary | ICD-10-CM | POA: Insufficient documentation

## 2022-04-03 DIAGNOSIS — I482 Chronic atrial fibrillation, unspecified: Secondary | ICD-10-CM

## 2022-04-03 DIAGNOSIS — I5022 Chronic systolic (congestive) heart failure: Secondary | ICD-10-CM | POA: Diagnosis present

## 2022-04-03 DIAGNOSIS — I4819 Other persistent atrial fibrillation: Secondary | ICD-10-CM | POA: Insufficient documentation

## 2022-04-03 DIAGNOSIS — Z6835 Body mass index (BMI) 35.0-35.9, adult: Secondary | ICD-10-CM | POA: Insufficient documentation

## 2022-04-03 DIAGNOSIS — Z9181 History of falling: Secondary | ICD-10-CM | POA: Diagnosis not present

## 2022-04-03 DIAGNOSIS — E1169 Type 2 diabetes mellitus with other specified complication: Secondary | ICD-10-CM

## 2022-04-03 DIAGNOSIS — E1122 Type 2 diabetes mellitus with diabetic chronic kidney disease: Secondary | ICD-10-CM | POA: Insufficient documentation

## 2022-04-03 DIAGNOSIS — E669 Obesity, unspecified: Secondary | ICD-10-CM | POA: Diagnosis not present

## 2022-04-03 DIAGNOSIS — Z791 Long term (current) use of non-steroidal anti-inflammatories (NSAID): Secondary | ICD-10-CM | POA: Diagnosis not present

## 2022-04-03 DIAGNOSIS — N183 Chronic kidney disease, stage 3 unspecified: Secondary | ICD-10-CM | POA: Insufficient documentation

## 2022-04-03 DIAGNOSIS — Z952 Presence of prosthetic heart valve: Secondary | ICD-10-CM | POA: Insufficient documentation

## 2022-04-03 DIAGNOSIS — I5043 Acute on chronic combined systolic (congestive) and diastolic (congestive) heart failure: Secondary | ICD-10-CM

## 2022-04-03 DIAGNOSIS — I5042 Chronic combined systolic (congestive) and diastolic (congestive) heart failure: Secondary | ICD-10-CM

## 2022-04-03 DIAGNOSIS — F319 Bipolar disorder, unspecified: Secondary | ICD-10-CM | POA: Diagnosis not present

## 2022-04-03 DIAGNOSIS — Z993 Dependence on wheelchair: Secondary | ICD-10-CM | POA: Diagnosis not present

## 2022-04-03 DIAGNOSIS — F0393 Unspecified dementia, unspecified severity, with mood disturbance: Secondary | ICD-10-CM | POA: Diagnosis not present

## 2022-04-03 DIAGNOSIS — Z7984 Long term (current) use of oral hypoglycemic drugs: Secondary | ICD-10-CM | POA: Insufficient documentation

## 2022-04-03 DIAGNOSIS — Z7901 Long term (current) use of anticoagulants: Secondary | ICD-10-CM | POA: Diagnosis not present

## 2022-04-03 DIAGNOSIS — I428 Other cardiomyopathies: Secondary | ICD-10-CM | POA: Diagnosis not present

## 2022-04-03 DIAGNOSIS — Z79899 Other long term (current) drug therapy: Secondary | ICD-10-CM | POA: Insufficient documentation

## 2022-04-03 DIAGNOSIS — E039 Hypothyroidism, unspecified: Secondary | ICD-10-CM | POA: Insufficient documentation

## 2022-04-03 LAB — LIPID PANEL
Cholesterol: 144 mg/dL (ref 0–200)
HDL: 60 mg/dL (ref >40)
LDL Cholesterol: 62 mg/dL (ref 0–99)
Total CHOL/HDL Ratio: 2.4 RATIO
Triglycerides: 112 mg/dL (ref <150)
VLDL: 22 mg/dL (ref 0–40)

## 2022-04-03 LAB — COMPREHENSIVE METABOLIC PANEL
ALT: 21 U/L (ref 0–44)
AST: 28 U/L (ref 15–41)
Albumin: 4 g/dL (ref 3.5–5.0)
Alkaline Phosphatase: 23 U/L — ABNORMAL LOW (ref 38–126)
Anion gap: 10 (ref 5–15)
BUN: 31 mg/dL — ABNORMAL HIGH (ref 8–23)
CO2: 26 mmol/L (ref 22–32)
Calcium: 9.4 mg/dL (ref 8.9–10.3)
Chloride: 104 mmol/L (ref 98–111)
Creatinine, Ser: 1.74 mg/dL — ABNORMAL HIGH (ref 0.44–1.00)
GFR, Estimated: 31 mL/min — ABNORMAL LOW (ref >60)
Glucose, Bld: 79 mg/dL (ref 70–99)
Potassium: 4.6 mmol/L (ref 3.5–5.1)
Sodium: 140 mmol/L (ref 135–145)
Total Bilirubin: 0.5 mg/dL (ref 0.3–1.2)
Total Protein: 6.8 g/dL (ref 6.5–8.1)

## 2022-04-03 LAB — TSH: TSH: 2.08 u[IU]/mL (ref 0.350–4.500)

## 2022-04-03 LAB — BRAIN NATRIURETIC PEPTIDE: B Natriuretic Peptide: 89.5 pg/mL (ref 0.0–100.0)

## 2022-04-03 NOTE — Patient Instructions (Signed)
There has been no changes to your medications..  Labs done today, your results will be available in MyChart, we will contact you for abnormal readings.  Your physician recommends that you schedule a follow-up appointment in: 3 months   If you have any questions or concerns before your next appointment please send us a message through mychart or call our office at [phone].    TO LEAVE A MESSAGE FOR THE NURSE SELECT OPTION 2, PLEASE LEAVE A MESSAGE INCLUDING: YOUR NAME DATE OF BIRTH CALL BACK NUMBER REASON FOR CALL**this is important as we prioritize the call backs  YOU WILL RECEIVE A CALL BACK THE SAME DAY AS LONG AS YOU CALL BEFORE 4:00 PM  At the [facility], you and your health needs are our priority. As part of our continuing mission to provide you with exceptional heart care, we have created designated Provider Care Teams. These Care Teams include your primary Cardiologist (physician) and Advanced Practice Providers (APPs- Physician Assistants and Nurse Practitioners) who all work together to provide you with the care you need, when you need it.   You may see any of the following providers on your designated Care Team at your next follow up: Dr Daniel Bensimhon Dr Dalton McLean Dr. Aditya Sabharwal Amy Clegg, NP Brittainy Simmons, PA Jessica Milford,NP Lindsay Finch, PA Alma Diaz, NP Lauren Kemp, PharmD   Please be sure to bring in all your medications bottles to every appointment.    

## 2022-04-03 NOTE — Progress Notes (Signed)
Advanced Heart Failure Clinic Note  PCP: Dr Charlett Blake  Primary Cardiologist: Dr Tamala Julian  HF Cardiology: Dr. Aundra Dubin  HPI: [name] is a 71 y.o. female with a hx of bicuspid AV/AS with ascending aortic aneurysm s/p resection/grafting of ascending aortic aneurysm and placement of St. Jude mechanical AV conduit/Bentall 2009, persistent atrial fib, bradycardia s/p [employer]  PPM implantation 12/2019, CVAs, multiple falls with prior craniotomy for subdural hematoma, prior chronic CVAs on MRI 2021, bipolar disorder, anemia, depression, debilitation (wheelchair bound), DM, HLD, HTN, mild carotid artery disease 2021, back pain, obesity, memory impairment, and chronic HFrEF.   Admitted 06/2021 with increased shortness of breath in the setting of acute HFrEF. Diuresed with IV lasix. Echo repeated EF 25-30%. Had cath with patent coronaries. Started on [place]. Discharged to [facility].   She appears to have gone back into atrial fibrillation some time between 5/22 and 12/22 after amiodarone was stopped. She has had 3 bad falls (12/21, 2/22, 4/22) resulting in subdural hematomas.  She is, however, still on warfarin.    Follow up 4/23, NYHA II-III, Entresto increased. Remained in AF and arranged for DCCV. S/p TEE/DCCV 09/23/21 to NSR.  Today she returns for HF follow up with her daughter. She is mainly wheelchair-bound but does some walking in her room with a rollator.  She rides an exercise bike.  She has mild dyspnea walking short distances with the rollator.  She is in NSR today, no palpitations. Weight has been stable at [facility] though up in our office today.  No lightheadedness with standing though she has vertigo-type symptoms at times.  She chronically elevates the head of her bed.   ECG (personally reviewed): a-paced, long PR, nonspecific T wave changes.   Labs (2/23): K 4.1, creatinine 1.68, hgb 10.8 Labs (3/23): K 4.2, creatinine 1.69, hgb 10.8, LFTs normal, TSH normal.   Labs (4/23): K 4.3, creatinine 1.62  PMH: 1. Bicuspid aortic valve with ascending aortic aneurysm: S/p St Jude mechanical aortic valve with Bentall procedure in 2009 2. Atrial fibrillation: Persistent. DCCV to NSR in 12/21.  Had maintained NSR on amiodarone but was stopped.  - s/p TEE/DCCV 5/23 to NSR 3. Bradycardia: [employer] PPM in 3/21.  4. CVAs: Now wheelchair-bound.  5. Multiple falls: 12/21 with subdural hematoma requiring craniotomy, 2/22 with subdural, 4/22 with subdural.  6. Bipolar disorder 7. Dementia 8. Hypothyroidism 9. CKD stage 3 10. Chronic systolic CHF: Nonischemic cardiomyopathy.  - Echo (11/22): EF 40-45%.  - LHC/RHC (2/23): No CAD; mean RA 12, PA 45/22, PCWP mean 25, CI 3.5 - Echo (2/23): EF 25-30%, global HK, RV normal, mild MR, St Jude mechanical AVR with normal function.  - TEE (5/23): EF 35-40%, RV mildly reduced, mild to moderate MR, St Jude mechanical AVR with normal function. 11. Hyperlipidemia 12. CKD stage 3   Review of Systems: All systems reviewed and negative except as per HPI.   Current Outpatient Medications  Medication Sig Dispense Refill   albuterol (VENTOLIN HFA) 108 (90 Base) MCG/ACT inhaler Inhale 2 puffs into the lungs at bedtime.     amiodarone (PACERONE) 200 MG tablet Take 200 mg by mouth daily.     carvedilol (COREG) 6.25 MG tablet Take 6.25 mg by mouth 2 (two) times daily with a meal.     cholecalciferol (VITAMIN D) 25 MCG tablet Take 1 tablet (1,000 Units total) by mouth daily.     dapagliflozin propanediol (FARXIGA) 10 MG TABS tablet Take 1  tablet (10 mg total) by mouth daily. 30 tablet 0   diclofenac Sodium (VOLTAREN) 1 % GEL Apply 2 g topically 2 (two) times daily. Apply to Knee     fenofibrate 160 MG tablet Take 1 tablet (160 mg total) by mouth daily.     fexofenadine (ALLEGRA) 60 MG tablet Take 60 mg by mouth daily.     furosemide (LASIX) 20 MG tablet Take 60 mg by mouth daily.     lamoTRIgine (LAMICTAL) 100 MG tablet Take  100 mg by mouth daily.     lamoTRIgine (LAMICTAL) 200 MG tablet Take 200 mg by mouth at bedtime.     levothyroxine (SYNTHROID) 25 MCG tablet TAKE 1 TABLET BY MOUTH DAILY BEFORE BREAKFAST. 90 tablet 0   lidocaine 4 % Place 1 patch onto the skin daily. Apply every morning  Off every night     meclizine (ANTIVERT) 25 MG tablet Take 25 mg by mouth as needed for dizziness.     melatonin 5 MG TABS Take 10 mg by mouth at bedtime.     Menthol, Topical Analgesic, (BIOFREEZE PROFESSIONAL) 5 % GEL Apply 1 application. topically 2 (two) times daily. Apply thin layer to right knee for pain     methocarbamol (ROBAXIN) 500 MG tablet Take 500 mg by mouth as needed for muscle spasms.     miconazole (MICOTIN) 2 % powder Apply 1 application. topically as needed for itching (vaginal itching and odor (keep at bedside)).     Multiple Vitamin (MULTIVITAMIN) tablet Take 1 tablet by mouth daily. With Iron 18-400 mg mcg     pantoprazole (PROTONIX) 40 MG tablet Take 1 tablet (40 mg total) by mouth daily.     potassium chloride SA (KLOR-CON M) 20 MEQ tablet Take 1 tablet (20 mEq total) by mouth daily.     rosuvastatin (CRESTOR) 40 MG tablet Take 40 mg by mouth daily.     sacubitril-valsartan (ENTRESTO) 49-51 MG Take 1 tablet by mouth 2 (two) times daily. 60 tablet 11   spironolactone (ALDACTONE) 25 MG tablet Take 1 tablet (25 mg total) by mouth daily. 30 tablet 11   traZODone (DESYREL) 150 MG tablet Take 150 mg by mouth at bedtime.     venlafaxine XR (EFFEXOR XR) 75 MG 24 hr capsule Take 1 capsule (75 mg total) by mouth daily with breakfast. 90 capsule 1   vitamin B-12 (CYANOCOBALAMIN) 1000 MCG tablet Take 1,000 mcg by mouth daily.     warfarin (COUMADIN) 2 MG tablet Take 2 mg by mouth daily.     No current facility-administered medications for this encounter.   Allergies  Allergen Reactions   Mucinex [Guaifenesin Er] Other (See Comments)    Severe headaches    Keflex [Cephalexin] Other (See Comments)    Headaches  and dizziness   Keflex [Cephalexin] Other (See Comments)    Headaches and/or dizziness- "allergic," per MAR   Mucinex [Guaifenesin Er] Other (See Comments)    Severe headaches- "allergic," per MAR   Sulfa Antibiotics Other (See Comments)    headaches   Sulfa Antibiotics Other (See Comments)    Headaches- "allergic," per MAR    Sulfonamide Derivatives Other (See Comments)    Headaches    Social History   Socioeconomic History   Marital status: Married    Spouse name: Not on file   Number of children: Not on file   Years of education: Not on file   Highest education level: Not on file  Occupational History   Not on  file  Tobacco Use   Smoking status: Former    Types: Cigarettes    Quit date: 05/26/1990    Years since quitting: 31.8   Smokeless tobacco: Never  Vaping Use   Vaping Use: Never used  Substance and Sexual Activity   Alcohol use: Yes    Alcohol/week: 0.0 standard drinks of alcohol    Comment: rare   Drug use: No   Sexual activity: Not on file  Other Topics Concern   Not on file  Social History Narrative   ** Merged History Encounter **       Social Determinants of Health   Financial Resource Strain: Not on file  Food Insecurity: Not on file  Transportation Needs: Not on file  Physical Activity: Not on file  Stress: Not on file  Social Connections: Not on file  Intimate Partner Violence: Not on file   Family History  Problem Relation Age of Onset   Scoliosis Mother    Arthritis Mother        Rheumatoid   Aneurysm Mother        heart   Alcohol abuse Father    Depression Sister    Depression Brother    Alcohol abuse Brother    Cancer Maternal Grandmother        colon   Diabetes Maternal Grandfather    Heart disease Maternal Grandfather    Alcohol abuse Maternal Grandfather    Depression Paternal Grandmother    Diabetes Paternal Grandmother    Depression Paternal Grandfather    Diabetes Paternal Grandfather    Depression Sister    Alcohol  abuse Brother    Depression Brother    Heart disease Brother    BP 104/60   Pulse 60   Wt 106.1 kg (234 lb)   SpO2 100%   BMI 35.58 kg/m   Wt Readings from Last 3 Encounters:  04/03/22 106.1 kg (234 lb)  10/23/21 98.9 kg (218 lb)  09/23/21 98.2 kg (216 lb 6.4 oz)   General: NAD Neck: No JVD, no thyromegaly or thyroid nodule.  Lungs: Clear to auscultation bilaterally with normal respiratory effort. CV: Nondisplaced PMI.  Heart regular S1/S2 with mechanical S2, no S3/S4, 2/6 early SEM.  No peripheral edema.  No carotid bruit.  Normal pedal pulses.  Abdomen: Soft, nontender, no hepatosplenomegaly, no distention.  Skin: Intact without lesions or rashes.  Neurologic: Alert and oriented x 3.  Psych: Normal affect. Extremities: No clubbing or cyanosis.  HEENT: Normal.   ASSESSMENT & PLAN: 1. Chronic systolic CHF: Nonischemic cardiomyopathy.  Cath 06/2020 without significant coronary disease.  06/2020 echo with EF 25-30%, previous echo 03/2021 with EF 40-45%. She has a Chemical engineer PPM but rarely paces her RV.  TEE (5/23) EF 35-40%. She is not volume overloaded on exam. NYHA class II-III symptoms, limited primarily by orthopedic issues.  - Continue Lasix 60 daily.  BMET today.  - Continue carvedilol 6.25 mg bid, no BP room to increase. - Continue Entresto 49/51 mg bid, no BP room to increase.  - Continue spironolactone 25 mg daily.  - Continue Farxiga 10 mg daily. Discussed notifying us if she has UTI/yeast infection. 2. CKD Stage III: BMET today.  3. Atrial fibrillation: Paroxysmal.  Patient was in NSR in 5/22 but was in atrial fibrillation in 2/23. Her worsening HF may be due at least in part to development of persistent AF. S/p TEE/DCCV to NSR 5/23. She remains in NSR today.  - Continue amiodarone 200 mg daily.  Check LFTs and TSH. She will need a regular eye exam.  - Continue warfarin.  4. Mechanical Aortic Valve: On warfarin, goal INR 2.5-3.5 with h/o atrial fibrillation.  Stable  valve on 2/23 echo and TEE 5/23. 5. Tachy/Brady Syndrome: [employer] PPM, she does not pace her RV enough to benefit from CRT upgrade.  6. Falls: Frequent in past with SDH.  Now in SNF, seems to be doing better with no recent falls.  She has remained on warfarin given mechanical AoV.   Follow up in 3 months with APP  Loralie Champagne  04/03/2022

## 2022-05-13 ENCOUNTER — Ambulatory Visit (INDEPENDENT_AMBULATORY_CARE_PROVIDER_SITE_OTHER): Payer: HMO

## 2022-05-13 DIAGNOSIS — I495 Sick sinus syndrome: Secondary | ICD-10-CM | POA: Diagnosis not present

## 2022-05-13 LAB — CUP PACEART REMOTE DEVICE CHECK
Battery Remaining Longevity: 150 mo
Battery Remaining Percentage: 100 %
Brady Statistic RA Percent Paced: 24 %
Brady Statistic RV Percent Paced: 8 %
Date Time Interrogation Session: 20231219023100
Implantable Lead Connection Status: 753985
Implantable Lead Connection Status: 753985
Implantable Lead Implant Date: 20210816
Implantable Lead Implant Date: 20210816
Implantable Lead Location: 753859
Implantable Lead Location: 753860
Implantable Lead Model: 7842
Implantable Lead Model: 7842
Implantable Lead Serial Number: 1013287
Implantable Lead Serial Number: 1091261
Implantable Pulse Generator Implant Date: 20210816
Lead Channel Impedance Value: 476 Ohm
Lead Channel Impedance Value: 626 Ohm
Lead Channel Setting Pacing Amplitude: 2.5 V
Lead Channel Setting Pacing Amplitude: 2.5 V
Lead Channel Setting Pacing Pulse Width: 0.4 ms
Lead Channel Setting Sensing Sensitivity: 2.5 mV
Pulse Gen Serial Number: 940432
Zone Setting Status: 755011

## 2022-05-27 DIAGNOSIS — F319 Bipolar disorder, unspecified: Secondary | ICD-10-CM | POA: Diagnosis not present

## 2022-05-27 DIAGNOSIS — Z952 Presence of prosthetic heart valve: Secondary | ICD-10-CM | POA: Diagnosis not present

## 2022-05-27 DIAGNOSIS — Z7901 Long term (current) use of anticoagulants: Secondary | ICD-10-CM | POA: Diagnosis not present

## 2022-05-27 DIAGNOSIS — I4891 Unspecified atrial fibrillation: Secondary | ICD-10-CM | POA: Diagnosis not present

## 2022-06-09 DIAGNOSIS — F3132 Bipolar disorder, current episode depressed, moderate: Secondary | ICD-10-CM | POA: Diagnosis not present

## 2022-06-09 DIAGNOSIS — F5101 Primary insomnia: Secondary | ICD-10-CM | POA: Diagnosis not present

## 2022-06-09 NOTE — Progress Notes (Signed)
Remote pacemaker transmission.   

## 2022-06-10 DIAGNOSIS — Z7901 Long term (current) use of anticoagulants: Secondary | ICD-10-CM | POA: Diagnosis not present

## 2022-06-10 DIAGNOSIS — I4892 Unspecified atrial flutter: Secondary | ICD-10-CM | POA: Diagnosis not present

## 2022-06-10 DIAGNOSIS — Z952 Presence of prosthetic heart valve: Secondary | ICD-10-CM | POA: Diagnosis not present

## 2022-06-10 DIAGNOSIS — R791 Abnormal coagulation profile: Secondary | ICD-10-CM | POA: Diagnosis not present

## 2022-06-10 DIAGNOSIS — J208 Acute bronchitis due to other specified organisms: Secondary | ICD-10-CM | POA: Diagnosis not present

## 2022-06-10 DIAGNOSIS — R6889 Other general symptoms and signs: Secondary | ICD-10-CM | POA: Diagnosis not present

## 2022-06-13 DIAGNOSIS — I4891 Unspecified atrial fibrillation: Secondary | ICD-10-CM | POA: Diagnosis not present

## 2022-06-13 DIAGNOSIS — R791 Abnormal coagulation profile: Secondary | ICD-10-CM | POA: Diagnosis not present

## 2022-06-13 DIAGNOSIS — J208 Acute bronchitis due to other specified organisms: Secondary | ICD-10-CM | POA: Diagnosis not present

## 2022-06-13 DIAGNOSIS — R6889 Other general symptoms and signs: Secondary | ICD-10-CM | POA: Diagnosis not present

## 2022-06-16 DIAGNOSIS — R791 Abnormal coagulation profile: Secondary | ICD-10-CM | POA: Diagnosis not present

## 2022-06-16 DIAGNOSIS — I272 Pulmonary hypertension, unspecified: Secondary | ICD-10-CM | POA: Diagnosis not present

## 2022-06-16 DIAGNOSIS — I4891 Unspecified atrial fibrillation: Secondary | ICD-10-CM | POA: Diagnosis not present

## 2022-06-16 DIAGNOSIS — R6889 Other general symptoms and signs: Secondary | ICD-10-CM | POA: Diagnosis not present

## 2022-06-16 DIAGNOSIS — F319 Bipolar disorder, unspecified: Secondary | ICD-10-CM | POA: Diagnosis not present

## 2022-06-16 DIAGNOSIS — J208 Acute bronchitis due to other specified organisms: Secondary | ICD-10-CM | POA: Diagnosis not present

## 2022-06-16 DIAGNOSIS — E039 Hypothyroidism, unspecified: Secondary | ICD-10-CM | POA: Diagnosis not present

## 2022-06-16 DIAGNOSIS — N1832 Chronic kidney disease, stage 3b: Secondary | ICD-10-CM | POA: Diagnosis not present

## 2022-06-17 DIAGNOSIS — I10 Essential (primary) hypertension: Secondary | ICD-10-CM | POA: Diagnosis not present

## 2022-06-17 DIAGNOSIS — R8290 Unspecified abnormal findings in urine: Secondary | ICD-10-CM | POA: Diagnosis not present

## 2022-06-17 DIAGNOSIS — R350 Frequency of micturition: Secondary | ICD-10-CM | POA: Diagnosis not present

## 2022-06-17 DIAGNOSIS — N390 Urinary tract infection, site not specified: Secondary | ICD-10-CM | POA: Diagnosis not present

## 2022-06-17 DIAGNOSIS — N3090 Cystitis, unspecified without hematuria: Secondary | ICD-10-CM | POA: Diagnosis not present

## 2022-06-19 ENCOUNTER — Telehealth (HOSPITAL_COMMUNITY): Payer: Self-pay

## 2022-06-19 NOTE — Telephone Encounter (Signed)
Patient's daughter left voice mail wanted to know if we can move her mother's appointment to an earlier date. She is having breathing difficulties and weight gain. The skilled nursing home asked if she could be seen sooner than her 07/04/22 appointment. Her daughter also indicated she has been sick recently with respiratory illness. Can you guys see if there is a slot?

## 2022-06-19 NOTE — Telephone Encounter (Signed)
Appt sch for [facility] 1/29

## 2022-06-20 DIAGNOSIS — N390 Urinary tract infection, site not specified: Secondary | ICD-10-CM | POA: Diagnosis not present

## 2022-06-20 DIAGNOSIS — Z9189 Other specified personal risk factors, not elsewhere classified: Secondary | ICD-10-CM | POA: Diagnosis not present

## 2022-06-20 DIAGNOSIS — Z5181 Encounter for therapeutic drug level monitoring: Secondary | ICD-10-CM | POA: Diagnosis not present

## 2022-06-20 DIAGNOSIS — S91002A Unspecified open wound, left ankle, initial encounter: Secondary | ICD-10-CM | POA: Diagnosis not present

## 2022-06-20 DIAGNOSIS — J208 Acute bronchitis due to other specified organisms: Secondary | ICD-10-CM | POA: Diagnosis not present

## 2022-06-20 NOTE — Progress Notes (Incomplete)
Advanced Heart Failure Clinic Note  PCP: Dr Charlett Blake  Primary Cardiologist: Dr Tamala Julian  HF Cardiology: Dr. Aundra Dubin  HPI: [name] is a 72 y.o. female with a hx of bicuspid AV/AS with ascending aortic aneurysm s/p resection/grafting of ascending aortic aneurysm and placement of St. Jude mechanical AV conduit/Bentall 2009, persistent atrial fib, bradycardia s/p [employer]  PPM implantation 12/2019, CVAs, multiple falls with prior craniotomy for subdural hematoma, prior chronic CVAs on MRI 2021, bipolar disorder, anemia, depression, debilitation (wheelchair bound), DM, HLD, HTN, mild carotid artery disease 2021, back pain, obesity, memory impairment, and chronic HFrEF.   Admitted 06/2021 with increased shortness of breath in the setting of acute HFrEF. Diuresed with IV lasix. Echo repeated EF 25-30%. Had cath with patent coronaries. Started on [place]. Discharged to [facility].   She appears to have gone back into atrial fibrillation some time between 5/22 and 12/22 after amiodarone was stopped. She has had 3 bad falls (12/21, 2/22, 4/22) resulting in subdural hematomas.  She is, however, still on warfarin.    Follow up 4/23, NYHA II-III, Entresto increased. Remained in AF and arranged for DCCV. S/p TEE/DCCV 09/23/21 to NSR.  Today she returns for HF follow up with her daughter. She is mainly wheelchair-bound but does some walking in her room with a rollator.  She rides an exercise bike.  She has mild dyspnea walking short distances with the rollator.  She is in NSR today, no palpitations. Weight has been stable at [facility] though up in our office today.  No lightheadedness with standing though she has vertigo-type symptoms at times.  She chronically elevates the head of her bed.   ECG (personally reviewed): a-paced, long PR, nonspecific T wave changes.   Labs (2/23): K 4.1, creatinine 1.68, hgb 10.8 Labs (3/23): K 4.2, creatinine 1.69, hgb 10.8, LFTs normal, TSH normal.   Labs (4/23): K 4.3, creatinine 1.62  PMH: 1. Bicuspid aortic valve with ascending aortic aneurysm: S/p St Jude mechanical aortic valve with Bentall procedure in 2009 2. Atrial fibrillation: Persistent. DCCV to NSR in 12/21.  Had maintained NSR on amiodarone but was stopped.  - s/p TEE/DCCV 5/23 to NSR 3. Bradycardia: [employer] PPM in 3/21.  4. CVAs: Now wheelchair-bound.  5. Multiple falls: 12/21 with subdural hematoma requiring craniotomy, 2/22 with subdural, 4/22 with subdural.  6. Bipolar disorder 7. Dementia 8. Hypothyroidism 9. CKD stage 3 10. Chronic systolic CHF: Nonischemic cardiomyopathy.  - Echo (11/22): EF 40-45%.  - LHC/RHC (2/23): No CAD; mean RA 12, PA 45/22, PCWP mean 25, CI 3.5 - Echo (2/23): EF 25-30%, global HK, RV normal, mild MR, St Jude mechanical AVR with normal function.  - TEE (5/23): EF 35-40%, RV mildly reduced, mild to moderate MR, St Jude mechanical AVR with normal function. 11. Hyperlipidemia 12. CKD stage 3   Review of Systems: All systems reviewed and negative except as per HPI.   Current Outpatient Medications  Medication Sig Dispense Refill   albuterol (VENTOLIN HFA) 108 (90 Base) MCG/ACT inhaler Inhale 2 puffs into the lungs at bedtime.     amiodarone (PACERONE) 200 MG tablet Take 200 mg by mouth daily.     carvedilol (COREG) 6.25 MG tablet Take 6.25 mg by mouth 2 (two) times daily with a meal.     cholecalciferol (VITAMIN D) 25 MCG tablet Take 1 tablet (1,000 Units total) by mouth daily.     dapagliflozin propanediol (FARXIGA) 10 MG TABS tablet Take 1  tablet (10 mg total) by mouth daily. 30 tablet 0   diclofenac Sodium (VOLTAREN) 1 % GEL Apply 2 g topically 2 (two) times daily. Apply to Knee     fenofibrate 160 MG tablet Take 1 tablet (160 mg total) by mouth daily.     fexofenadine (ALLEGRA) 60 MG tablet Take 60 mg by mouth daily.     furosemide (LASIX) 20 MG tablet Take 60 mg by mouth daily.     lamoTRIgine (LAMICTAL) 100 MG tablet Take  100 mg by mouth daily.     lamoTRIgine (LAMICTAL) 200 MG tablet Take 200 mg by mouth at bedtime.     levothyroxine (SYNTHROID) 25 MCG tablet TAKE 1 TABLET BY MOUTH DAILY BEFORE BREAKFAST. 90 tablet 0   lidocaine 4 % Place 1 patch onto the skin daily. Apply every morning  Off every night     meclizine (ANTIVERT) 25 MG tablet Take 25 mg by mouth as needed for dizziness.     melatonin 5 MG TABS Take 10 mg by mouth at bedtime.     Menthol, Topical Analgesic, (BIOFREEZE PROFESSIONAL) 5 % GEL Apply 1 application. topically 2 (two) times daily. Apply thin layer to right knee for pain     methocarbamol (ROBAXIN) 500 MG tablet Take 500 mg by mouth as needed for muscle spasms.     miconazole (MICOTIN) 2 % powder Apply 1 application. topically as needed for itching (vaginal itching and odor (keep at bedside)).     Multiple Vitamin (MULTIVITAMIN) tablet Take 1 tablet by mouth daily. With Iron 18-400 mg mcg     pantoprazole (PROTONIX) 40 MG tablet Take 1 tablet (40 mg total) by mouth daily.     potassium chloride SA (KLOR-CON M) 20 MEQ tablet Take 1 tablet (20 mEq total) by mouth daily.     rosuvastatin (CRESTOR) 40 MG tablet Take 40 mg by mouth daily.     sacubitril-valsartan (ENTRESTO) 49-51 MG Take 1 tablet by mouth 2 (two) times daily. 60 tablet 11   spironolactone (ALDACTONE) 25 MG tablet Take 1 tablet (25 mg total) by mouth daily. 30 tablet 11   traZODone (DESYREL) 150 MG tablet Take 150 mg by mouth at bedtime.     venlafaxine XR (EFFEXOR XR) 75 MG 24 hr capsule Take 1 capsule (75 mg total) by mouth daily with breakfast. 90 capsule 1   vitamin B-12 (CYANOCOBALAMIN) 1000 MCG tablet Take 1,000 mcg by mouth daily.     warfarin (COUMADIN) 2 MG tablet Take 2 mg by mouth daily.     No current facility-administered medications for this visit.   Allergies  Allergen Reactions   Mucinex [Guaifenesin Er] Other (See Comments)    Severe headaches    Keflex [Cephalexin] Other (See Comments)    Headaches and  dizziness   Keflex [Cephalexin] Other (See Comments)    Headaches and/or dizziness- "allergic," per MAR   Mucinex [Guaifenesin Er] Other (See Comments)    Severe headaches- "allergic," per MAR   Sulfa Antibiotics Other (See Comments)    headaches   Sulfa Antibiotics Other (See Comments)    Headaches- "allergic," per MAR    Sulfonamide Derivatives Other (See Comments)    Headaches    Social History   Socioeconomic History   Marital status: Married    Spouse name: Not on file   Number of children: Not on file   Years of education: Not on file   Highest education level: Not on file  Occupational History   Not on  file  Tobacco Use   Smoking status: Former    Types: Cigarettes    Quit date: 05/26/1990    Years since quitting: 32.0   Smokeless tobacco: Never  Vaping Use   Vaping Use: Never used  Substance and Sexual Activity   Alcohol use: Yes    Alcohol/week: 0.0 standard drinks of alcohol    Comment: rare   Drug use: No   Sexual activity: Not on file  Other Topics Concern   Not on file  Social History Narrative   ** Merged History Encounter **       Social Determinants of Health   Financial Resource Strain: Not on file  Food Insecurity: Not on file  Transportation Needs: Not on file  Physical Activity: Not on file  Stress: Not on file  Social Connections: Not on file  Intimate Partner Violence: Not on file   Family History  Problem Relation Age of Onset   Scoliosis Mother    Arthritis Mother        Rheumatoid   Aneurysm Mother        heart   Alcohol abuse Father    Depression Sister    Depression Brother    Alcohol abuse Brother    Cancer Maternal Grandmother        colon   Diabetes Maternal Grandfather    Heart disease Maternal Grandfather    Alcohol abuse Maternal Grandfather    Depression Paternal Grandmother    Diabetes Paternal Grandmother    Depression Paternal Grandfather    Diabetes Paternal Grandfather    Depression Sister    Alcohol abuse  Brother    Depression Brother    Heart disease Brother    There were no vitals taken for this visit.  Wt Readings from Last 3 Encounters:  04/03/22 106.1 kg (234 lb)  10/23/21 98.9 kg (218 lb)  09/23/21 98.2 kg (216 lb 6.4 oz)   General: NAD Neck: No JVD, no thyromegaly or thyroid nodule.  Lungs: Clear to auscultation bilaterally with normal respiratory effort. CV: Nondisplaced PMI.  Heart regular S1/S2 with mechanical S2, no S3/S4, 2/6 early SEM.  No peripheral edema.  No carotid bruit.  Normal pedal pulses.  Abdomen: Soft, nontender, no hepatosplenomegaly, no distention.  Skin: Intact without lesions or rashes.  Neurologic: Alert and oriented x 3.  Psych: Normal affect. Extremities: No clubbing or cyanosis.  HEENT: Normal.   ASSESSMENT & PLAN: 1. Chronic systolic CHF: Nonischemic cardiomyopathy.  Cath 06/2020 without significant coronary disease.  06/2020 echo with EF 25-30%, previous echo 03/2021 with EF 40-45%. She has a Chemical engineer PPM but rarely paces her RV.  TEE (5/23) EF 35-40%. She is not volume overloaded on exam. NYHA class II-III symptoms, limited primarily by orthopedic issues.  - Continue Lasix 60 daily.  BMET today.  - Continue carvedilol 6.25 mg bid, no BP room to increase. - Continue Entresto 49/51 mg bid, no BP room to increase.  - Continue spironolactone 25 mg daily.  - Continue Farxiga 10 mg daily. Discussed notifying us if she has UTI/yeast infection. 2. CKD Stage III: BMET today.  3. Atrial fibrillation: Paroxysmal.  Patient was in NSR in 5/22 but was in atrial fibrillation in 2/23. Her worsening HF may be due at least in part to development of persistent AF. S/p TEE/DCCV to NSR 5/23. She remains in NSR today.  - Continue amiodarone 200 mg daily. Check LFTs and TSH. She will need a regular eye exam.  - Continue warfarin.  4. Mechanical Aortic Valve: On warfarin, goal INR 2.5-3.5 with h/o atrial fibrillation.  Stable valve on 2/23 echo and TEE 5/23. 5.  Tachy/Brady Syndrome: [employer] PPM, she does not pace her RV enough to benefit from CRT upgrade.  6. Falls: Frequent in past with [facility].  Now in SNF, seems to be doing better with no recent falls.  She has remained on warfarin given mechanical AoV.   Follow up in 3 months with APP  [place]  06/20/2022

## 2022-06-21 DIAGNOSIS — R059 Cough, unspecified: Secondary | ICD-10-CM | POA: Diagnosis not present

## 2022-06-23 ENCOUNTER — Encounter (HOSPITAL_COMMUNITY): Payer: HMO

## 2022-06-23 DIAGNOSIS — I480 Paroxysmal atrial fibrillation: Secondary | ICD-10-CM | POA: Diagnosis not present

## 2022-06-23 DIAGNOSIS — R2681 Unsteadiness on feet: Secondary | ICD-10-CM | POA: Diagnosis not present

## 2022-06-23 DIAGNOSIS — M6281 Muscle weakness (generalized): Secondary | ICD-10-CM | POA: Diagnosis not present

## 2022-06-24 ENCOUNTER — Encounter (HOSPITAL_COMMUNITY): Payer: HMO

## 2022-06-25 DIAGNOSIS — N3090 Cystitis, unspecified without hematuria: Secondary | ICD-10-CM | POA: Diagnosis not present

## 2022-06-25 DIAGNOSIS — R059 Cough, unspecified: Secondary | ICD-10-CM | POA: Diagnosis not present

## 2022-06-25 DIAGNOSIS — J208 Acute bronchitis due to other specified organisms: Secondary | ICD-10-CM | POA: Diagnosis not present

## 2022-06-25 DIAGNOSIS — I5042 Chronic combined systolic (congestive) and diastolic (congestive) heart failure: Secondary | ICD-10-CM | POA: Diagnosis not present

## 2022-06-25 DIAGNOSIS — T300 Burn of unspecified body region, unspecified degree: Secondary | ICD-10-CM | POA: Diagnosis not present

## 2022-06-25 DIAGNOSIS — I4891 Unspecified atrial fibrillation: Secondary | ICD-10-CM | POA: Diagnosis not present

## 2022-06-25 DIAGNOSIS — R791 Abnormal coagulation profile: Secondary | ICD-10-CM | POA: Diagnosis not present

## 2022-06-26 DIAGNOSIS — I480 Paroxysmal atrial fibrillation: Secondary | ICD-10-CM | POA: Diagnosis not present

## 2022-06-26 DIAGNOSIS — M6281 Muscle weakness (generalized): Secondary | ICD-10-CM | POA: Diagnosis not present

## 2022-06-26 DIAGNOSIS — R2681 Unsteadiness on feet: Secondary | ICD-10-CM | POA: Diagnosis not present

## 2022-07-02 DIAGNOSIS — M6249 Contracture of muscle, multiple sites: Secondary | ICD-10-CM | POA: Diagnosis not present

## 2022-07-02 DIAGNOSIS — M545 Low back pain, unspecified: Secondary | ICD-10-CM | POA: Diagnosis not present

## 2022-07-02 DIAGNOSIS — I509 Heart failure, unspecified: Secondary | ICD-10-CM | POA: Diagnosis not present

## 2022-07-02 DIAGNOSIS — Z9181 History of falling: Secondary | ICD-10-CM | POA: Diagnosis not present

## 2022-07-02 DIAGNOSIS — R2681 Unsteadiness on feet: Secondary | ICD-10-CM | POA: Diagnosis not present

## 2022-07-02 DIAGNOSIS — F319 Bipolar disorder, unspecified: Secondary | ICD-10-CM | POA: Diagnosis not present

## 2022-07-02 DIAGNOSIS — M6250 Muscle wasting and atrophy, not elsewhere classified, unspecified site: Secondary | ICD-10-CM | POA: Diagnosis not present

## 2022-07-02 DIAGNOSIS — M6281 Muscle weakness (generalized): Secondary | ICD-10-CM | POA: Diagnosis not present

## 2022-07-02 DIAGNOSIS — R52 Pain, unspecified: Secondary | ICD-10-CM | POA: Diagnosis not present

## 2022-07-03 DIAGNOSIS — I150 Renovascular hypertension: Secondary | ICD-10-CM | POA: Diagnosis not present

## 2022-07-03 DIAGNOSIS — J208 Acute bronchitis due to other specified organisms: Secondary | ICD-10-CM | POA: Diagnosis not present

## 2022-07-03 DIAGNOSIS — Z5181 Encounter for therapeutic drug level monitoring: Secondary | ICD-10-CM | POA: Diagnosis not present

## 2022-07-03 DIAGNOSIS — N390 Urinary tract infection, site not specified: Secondary | ICD-10-CM | POA: Diagnosis not present

## 2022-07-03 DIAGNOSIS — S91002A Unspecified open wound, left ankle, initial encounter: Secondary | ICD-10-CM | POA: Diagnosis not present

## 2022-07-04 ENCOUNTER — Encounter (HOSPITAL_COMMUNITY): Payer: Self-pay

## 2022-07-04 ENCOUNTER — Ambulatory Visit (HOSPITAL_COMMUNITY)
Admission: RE | Admit: 2022-07-04 | Discharge: 2022-07-04 | Disposition: A | Discharge status: Home / Self Care | Payer: HMO | Source: Ambulatory Visit | Attending: Family Medicine | Admitting: Family Medicine

## 2022-07-04 VITALS — BP 146/72 | HR 64 | Wt 237.0 lb

## 2022-07-04 DIAGNOSIS — Z952 Presence of prosthetic heart valve: Secondary | ICD-10-CM | POA: Diagnosis not present

## 2022-07-04 DIAGNOSIS — E669 Obesity, unspecified: Secondary | ICD-10-CM | POA: Diagnosis not present

## 2022-07-04 DIAGNOSIS — I428 Other cardiomyopathies: Secondary | ICD-10-CM | POA: Insufficient documentation

## 2022-07-04 DIAGNOSIS — I4819 Other persistent atrial fibrillation: Secondary | ICD-10-CM | POA: Insufficient documentation

## 2022-07-04 DIAGNOSIS — Z87891 Personal history of nicotine dependence: Secondary | ICD-10-CM | POA: Insufficient documentation

## 2022-07-04 DIAGNOSIS — N1831 Chronic kidney disease, stage 3a: Secondary | ICD-10-CM | POA: Diagnosis not present

## 2022-07-04 DIAGNOSIS — I130 Hypertensive heart and chronic kidney disease with heart failure and stage 1 through stage 4 chronic kidney disease, or unspecified chronic kidney disease: Secondary | ICD-10-CM | POA: Insufficient documentation

## 2022-07-04 DIAGNOSIS — Z8673 Personal history of transient ischemic attack (TIA), and cerebral infarction without residual deficits: Secondary | ICD-10-CM | POA: Diagnosis not present

## 2022-07-04 DIAGNOSIS — E1122 Type 2 diabetes mellitus with diabetic chronic kidney disease: Secondary | ICD-10-CM | POA: Diagnosis not present

## 2022-07-04 DIAGNOSIS — E785 Hyperlipidemia, unspecified: Secondary | ICD-10-CM | POA: Diagnosis not present

## 2022-07-04 DIAGNOSIS — I5043 Acute on chronic combined systolic (congestive) and diastolic (congestive) heart failure: Secondary | ICD-10-CM

## 2022-07-04 DIAGNOSIS — Z8249 Family history of ischemic heart disease and other diseases of the circulatory system: Secondary | ICD-10-CM | POA: Insufficient documentation

## 2022-07-04 DIAGNOSIS — Z87440 Personal history of urinary (tract) infections: Secondary | ICD-10-CM | POA: Insufficient documentation

## 2022-07-04 DIAGNOSIS — Z7984 Long term (current) use of oral hypoglycemic drugs: Secondary | ICD-10-CM | POA: Insufficient documentation

## 2022-07-04 DIAGNOSIS — I5022 Chronic systolic (congestive) heart failure: Secondary | ICD-10-CM | POA: Diagnosis not present

## 2022-07-04 DIAGNOSIS — Z7901 Long term (current) use of anticoagulants: Secondary | ICD-10-CM | POA: Diagnosis not present

## 2022-07-04 DIAGNOSIS — Z833 Family history of diabetes mellitus: Secondary | ICD-10-CM | POA: Insufficient documentation

## 2022-07-04 DIAGNOSIS — Z6836 Body mass index (BMI) 36.0-36.9, adult: Secondary | ICD-10-CM | POA: Diagnosis not present

## 2022-07-04 DIAGNOSIS — I480 Paroxysmal atrial fibrillation: Secondary | ICD-10-CM | POA: Diagnosis not present

## 2022-07-04 DIAGNOSIS — I495 Sick sinus syndrome: Secondary | ICD-10-CM | POA: Insufficient documentation

## 2022-07-04 DIAGNOSIS — R296 Repeated falls: Secondary | ICD-10-CM

## 2022-07-04 DIAGNOSIS — Z79899 Other long term (current) drug therapy: Secondary | ICD-10-CM | POA: Diagnosis not present

## 2022-07-04 DIAGNOSIS — F319 Bipolar disorder, unspecified: Secondary | ICD-10-CM | POA: Insufficient documentation

## 2022-07-04 DIAGNOSIS — Z993 Dependence on wheelchair: Secondary | ICD-10-CM | POA: Diagnosis not present

## 2022-07-04 DIAGNOSIS — N183 Chronic kidney disease, stage 3 unspecified: Secondary | ICD-10-CM | POA: Diagnosis not present

## 2022-07-04 DIAGNOSIS — Z950 Presence of cardiac pacemaker: Secondary | ICD-10-CM | POA: Insufficient documentation

## 2022-07-04 LAB — BASIC METABOLIC PANEL
Anion gap: 9 (ref 5–15)
BUN: 28 mg/dL — ABNORMAL HIGH (ref 8–23)
CO2: 23 mmol/L (ref 22–32)
Calcium: 9.1 mg/dL (ref 8.9–10.3)
Chloride: 104 mmol/L (ref 98–111)
Creatinine, Ser: 1.56 mg/dL — ABNORMAL HIGH (ref 0.44–1.00)
GFR, Estimated: 35 mL/min — ABNORMAL LOW (ref >60)
Glucose, Bld: 105 mg/dL — ABNORMAL HIGH (ref 70–99)
Potassium: 4.5 mmol/L (ref 3.5–5.1)
Sodium: 136 mmol/L (ref 135–145)

## 2022-07-04 LAB — BRAIN NATRIURETIC PEPTIDE: B Natriuretic Peptide: 116 pg/mL — ABNORMAL HIGH (ref 0.0–100.0)

## 2022-07-04 NOTE — Patient Instructions (Addendum)
Thank you for coming in today  Labs were done today, if any labs are abnormal the clinic will call you No news is good news  PLEASE TAKE LASIX 60 MG DAILY  PLEASE WEAR COMPRESSION HOSE Please wear your compression hose daily, place them on as soon as you get up in the morning and remove before you go to bed at night.  Your physician recommends that you schedule a follow-up appointment in:  4 months with Dr. Kendall Flack will receive a reminder letter in the mail a few months in advance. If you don't receive a letter, please call our office to schedule the follow-up appointment.     Do the following things EVERYDAY: Weigh yourself in the morning before breakfast. Write it down and keep it in a log. Take your medicines as prescribed Eat low salt foods--Limit salt (sodium) to 2000 mg per day.  Stay as active as you can everyday Limit all fluids for the day to less than 2 liters   At the [facility], you and your health needs are our priority. As part of our continuing mission to provide you with exceptional heart care, we have created designated Provider Care Teams. These Care Teams include your primary Cardiologist (physician) and Advanced Practice Providers (APPs- Physician Assistants and Nurse Practitioners) who all work together to provide you with the care you need, when you need it.   You may see any of the following providers on your designated Care Team at your next follow up: Dr Glori Bickers Dr Loralie Champagne Dr. Roxana Hires, NP Lyda Jester, [place] [facility] [place], [place] Forestine Na, NP Audry Riles, PharmD   Please be sure to bring in all your medications bottles to every appointment.    Thank you for choosing [facility]  If you have any questions or concerns before your next appointment please send [place] a message through [place] or call our office at [phone].    TO LEAVE A  MESSAGE FOR THE NURSE SELECT OPTION 2, PLEASE LEAVE A MESSAGE INCLUDING: YOUR NAME DATE OF BIRTH CALL BACK NUMBER REASON FOR CALL**this is important as we prioritize the call backs  YOU WILL RECEIVE A CALL BACK THE SAME DAY AS LONG AS YOU CALL BEFORE 4:00 PM

## 2022-07-04 NOTE — Addendum Note (Signed)
Encounter addended by: Rafael Bihari, FNP on: 07/04/2022 4:23 PM  Actions taken: Clinical Note Signed

## 2022-07-04 NOTE — Progress Notes (Addendum)
Advanced Heart Failure Clinic Note  PCP: Dr Charlett Blake  Primary Cardiologist: Dr Tamala Julian  HF Cardiology: Dr. Aundra Dubin  HPI: [name] is a 72 y.o. female with a hx of bicuspid AV/AS with ascending aortic aneurysm s/p resection/grafting of ascending aortic aneurysm and placement of St. Jude mechanical AV conduit/Bentall 2009, persistent atrial fib, bradycardia s/p [employer]  PPM implantation 12/2019, CVAs, multiple falls with prior craniotomy for subdural hematoma, prior chronic CVAs on MRI 2021, bipolar disorder, anemia, depression, debilitation (wheelchair bound), DM, HLD, HTN, mild carotid artery disease 2021, back pain, obesity, memory impairment, and chronic HFrEF.   Admitted 06/2021 with increased shortness of breath in the setting of acute HFrEF. Diuresed with IV lasix. Echo repeated EF 25-30%. Had cath with patent coronaries. Started on [place]. Discharged to [facility].   She appears to have gone back into atrial fibrillation some time between 5/22 and 12/22 after amiodarone was stopped. She has had 3 bad falls (12/21, 2/22, 4/22) resulting in subdural hematomas.  She is, however, still on warfarin.    Follow up 4/23, NYHA II-III, Entresto increased. Remained in AF and arranged for DCCV. S/p TEE/DCCV 09/23/21 to NSR.  Follow up 11/23, NYHA II-III symptoms, volume stable. Residing in SNF.  Today she returns for HF follow up. Overall feeling fine. She is mainly wheel chair bound, resides at [facility] She wheels herself in her [facility] and transfers without dyspnea. Has PT at facility. She has had dizziness and balance issues. Denies palpitations, abnormal bleeding, CP, or PND/Orthopnea. She chronically sleeps reclined. Appetite ok. No fever or chills. Taking all medications provided by facility, but she says they have been frequently holding her lasix if her sBP < 110. Daughter tells me they check her BP very early in the AM so this may be why her pressures were lower.  Had a recent UTI and received abx.   ECG (personally reviewed): none ordered today.   [employer] interrogation (personally reviewed): no AT/AF, 0.3 hr/day activity  Labs (2/23): K 4.1, creatinine 1.68, hgb 10.8 Labs (3/23): K 4.2, creatinine 1.69, hgb 10.8, LFTs normal, TSH normal.  Labs (4/23): K 4.3, creatinine 1.62 Labs (11/23): K 4.6, creatinine 1.74, LFTs normal, TSH normal  PMH: 1. Bicuspid aortic valve with ascending aortic aneurysm: S/p St Jude mechanical aortic valve with Bentall procedure in 2009 2. Atrial fibrillation: Persistent. DCCV to NSR in 12/21.  Had maintained NSR on amiodarone but was stopped.  - s/p TEE/DCCV 5/23 to NSR 3. Bradycardia: [employer] PPM in 3/21.  4. CVAs: Now wheelchair-bound.  5. Multiple falls: 12/21 with subdural hematoma requiring craniotomy, 2/22 with subdural, 4/22 with subdural.  6. Bipolar disorder 7. Dementia 8. Hypothyroidism 9. CKD stage 3 10. Chronic systolic CHF: Nonischemic cardiomyopathy.  - Echo (11/22): EF 40-45%.  - LHC/RHC (2/23): No CAD; mean RA 12, PA 45/22, PCWP mean 25, CI 3.5 - Echo (2/23): EF 25-30%, global HK, RV normal, mild MR, St Jude mechanical AVR with normal function.  - TEE (5/23): EF 35-40%, RV mildly reduced, mild to moderate MR, St Jude mechanical AVR with normal function. 11. Hyperlipidemia 12. CKD stage 3   Review of Systems: All systems reviewed and negative except as per HPI.   Current Outpatient Medications  Medication Sig Dispense Refill   albuterol (VENTOLIN HFA) 108 (90 Base) MCG/ACT inhaler Inhale 2 puffs into the lungs at bedtime.     amiodarone (PACERONE) 200 MG tablet Take 200 mg by mouth daily.  carvedilol (COREG) 6.25 MG tablet Take 6.25 mg by mouth 2 (two) times daily with a meal.     cholecalciferol (VITAMIN D) 25 MCG tablet Take 1 tablet (1,000 Units total) by mouth daily.     dapagliflozin propanediol (FARXIGA) 10 MG TABS tablet Take 1 tablet (10 mg total) by mouth daily. 30 tablet 0    diclofenac Sodium (VOLTAREN) 1 % GEL Apply 2 g topically 2 (two) times daily. Apply to Knee     fenofibrate 160 MG tablet Take 1 tablet (160 mg total) by mouth daily.     fexofenadine (ALLEGRA) 60 MG tablet Take 60 mg by mouth daily.     furosemide (LASIX) 20 MG tablet Take 60 mg by mouth daily.     lamoTRIgine (LAMICTAL) 100 MG tablet Take 100 mg by mouth daily.     lamoTRIgine (LAMICTAL) 200 MG tablet Take 200 mg by mouth at bedtime.     levothyroxine (SYNTHROID) 25 MCG tablet TAKE 1 TABLET BY MOUTH DAILY BEFORE BREAKFAST. 90 tablet 0   lidocaine 4 % Place 1 patch onto the skin daily. Apply every morning  Off every night     meclizine (ANTIVERT) 25 MG tablet Take 25 mg by mouth as needed for dizziness.     melatonin 5 MG TABS Take 10 mg by mouth at bedtime.     Menthol, Topical Analgesic, (BIOFREEZE PROFESSIONAL) 5 % GEL Apply 1 application. topically 2 (two) times daily. Apply thin layer to right knee for pain     methocarbamol (ROBAXIN) 500 MG tablet Take 500 mg by mouth as needed for muscle spasms.     miconazole (MICOTIN) 2 % powder Apply 1 application. topically as needed for itching (vaginal itching and odor (keep at bedside)).     Multiple Vitamin (MULTIVITAMIN) tablet Take 1 tablet by mouth daily. With Iron 18-400 mg mcg     pantoprazole (PROTONIX) 40 MG tablet Take 1 tablet (40 mg total) by mouth daily.     potassium chloride SA (KLOR-CON M) 20 MEQ tablet Take 1 tablet (20 mEq total) by mouth daily.     rosuvastatin (CRESTOR) 40 MG tablet Take 40 mg by mouth daily.     sacubitril-valsartan (ENTRESTO) 49-51 MG Take 1 tablet by mouth 2 (two) times daily. 60 tablet 11   spironolactone (ALDACTONE) 25 MG tablet Take 1 tablet (25 mg total) by mouth daily. 30 tablet 11   traZODone (DESYREL) 150 MG tablet Take 150 mg by mouth at bedtime.     venlafaxine XR (EFFEXOR XR) 75 MG 24 hr capsule Take 1 capsule (75 mg total) by mouth daily with breakfast. 90 capsule 1   vitamin B-12 (CYANOCOBALAMIN)  1000 MCG tablet Take 1,000 mcg by mouth daily.     warfarin (COUMADIN) 2 MG tablet Take 2 mg by mouth daily.     No current facility-administered medications for this encounter.   Allergies  Allergen Reactions   Mucinex [Guaifenesin Er] Other (See Comments)    Severe headaches    Keflex [Cephalexin] Other (See Comments)    Headaches and dizziness   Keflex [Cephalexin] Other (See Comments)    Headaches and/or dizziness- "allergic," per MAR   Mucinex [Guaifenesin Er] Other (See Comments)    Severe headaches- "allergic," per MAR   Sulfa Antibiotics Other (See Comments)    headaches   Sulfa Antibiotics Other (See Comments)    Headaches- "allergic," per MAR    Sulfonamide Derivatives Other (See Comments)    Headaches    Social  History   Socioeconomic History   Marital status: Married    Spouse name: Not on file   Number of children: Not on file   Years of education: Not on file   Highest education level: Not on file  Occupational History   Not on file  Tobacco Use   Smoking status: Former    Types: Cigarettes    Quit date: 05/26/1990    Years since quitting: 32.1   Smokeless tobacco: Never  Vaping Use   Vaping Use: Never used  Substance and Sexual Activity   Alcohol use: Yes    Alcohol/week: 0.0 standard drinks of alcohol    Comment: rare   Drug use: No   Sexual activity: Not on file  Other Topics Concern   Not on file  Social History Narrative   ** Merged History Encounter **       Social Determinants of Health   Financial Resource Strain: Not on file  Food Insecurity: Not on file  Transportation Needs: Not on file  Physical Activity: Not on file  Stress: Not on file  Social Connections: Not on file  Intimate Partner Violence: Not on file   Family History  Problem Relation Age of Onset   Scoliosis Mother    Arthritis Mother        Rheumatoid   Aneurysm Mother        heart   Alcohol abuse Father    Depression Sister    Depression Brother    Alcohol  abuse Brother    Cancer Maternal Grandmother        colon   Diabetes Maternal Grandfather    Heart disease Maternal Grandfather    Alcohol abuse Maternal Grandfather    Depression Paternal Grandmother    Diabetes Paternal Grandmother    Depression Paternal Grandfather    Diabetes Paternal Grandfather    Depression Sister    Alcohol abuse Brother    Depression Brother    Heart disease Brother    BP (!) 146/72   Pulse 64   Wt 107.5 kg (237 lb)   SpO2 98%   BMI 36.04 kg/m   Wt Readings from Last 3 Encounters:  07/04/22 107.5 kg (237 lb)  04/03/22 106.1 kg (234 lb)  10/23/21 98.9 kg (218 lb)   Physical Exam General:  NAD. No resp difficulty, arrived in [facility] HEENT: Normal Neck: Supple. No JVD. Carotids 2+ bilat; no bruits. No lymphadenopathy or thryomegaly appreciated. Cor: PMI nondisplaced. Regular rate & rhythm. No rubs, gallops, 2/6 SEM + mechanical S2 Lungs: Clear Abdomen: Soft, obese, nontender, nondistended. No hepatosplenomegaly. No bruits or masses. Good bowel sounds. Extremities: No cyanosis, clubbing, rash, pedal edema L > R Neuro: Alert & oriented x 3, cranial nerves grossly intact. Moves all 4 extremities w/o difficulty. Affect pleasant.  ASSESSMENT & PLAN: 1. Chronic systolic CHF: Nonischemic cardiomyopathy.  Cath 06/2020 without significant coronary disease.  06/2020 echo with EF 25-30%, previous echo 03/2021 with EF 40-45%. She has a Chemical engineer PPM but rarely paces her RV.  TEE (5/23) EF 35-40%. She is not volume overloaded on exam. NYHA class II-III symptoms, limited primarily by orthopedic issues.  - Continue Lasix 60 mg daily.  BMET/BNP today.  - Continue carvedilol 6.25 mg bid. - Continue Entresto 49/51 mg bid. - Continue spironolactone 25 mg daily.  - Continue Farxiga 10 mg daily. Discussed notifying us if she has UTI/yeast infection. - With pedal edema, I asked her to wear her compression hose. 2. CKD Stage  III: BMET today.  3. Atrial fibrillation:  Paroxysmal.  Patient was in NSR in 5/22 but was in atrial fibrillation in 2/23. Her worsening HF may be due at least in part to development of persistent AF. S/p TEE/DCCV to NSR 5/23. She remains in NSR today.  - Continue amiodarone 200 mg daily. Recent LFTs and TSH normal. She will need a regular eye exam.  - Continue warfarin.  4. Mechanical Aortic Valve: On warfarin, goal INR 2.5-3.5 with h/o atrial fibrillation.  Stable valve on 2/23 echo and TEE 5/23. 5. Tachy/Brady Syndrome: [employer] PPM, she does not pace her RV enough to benefit from CRT upgrade.  6. Falls: Frequent in past with SDH.  Now in SNF, seems to be doing better with no recent falls.  She has remained on warfarin given mechanical AoV.   Follow up in 4 months with Dr. Aundra Dubin.  Maricela Bo [facility] FNP-BC 07/04/2022

## 2022-07-30 DIAGNOSIS — I272 Pulmonary hypertension, unspecified: Secondary | ICD-10-CM | POA: Diagnosis not present

## 2022-07-30 DIAGNOSIS — N1832 Chronic kidney disease, stage 3b: Secondary | ICD-10-CM | POA: Diagnosis not present

## 2022-07-30 DIAGNOSIS — Z952 Presence of prosthetic heart valve: Secondary | ICD-10-CM | POA: Diagnosis not present

## 2022-07-30 DIAGNOSIS — Z7901 Long term (current) use of anticoagulants: Secondary | ICD-10-CM | POA: Diagnosis not present

## 2022-07-30 DIAGNOSIS — I5042 Chronic combined systolic (congestive) and diastolic (congestive) heart failure: Secondary | ICD-10-CM | POA: Diagnosis not present

## 2022-07-30 DIAGNOSIS — E039 Hypothyroidism, unspecified: Secondary | ICD-10-CM | POA: Diagnosis not present

## 2022-07-30 DIAGNOSIS — Z6836 Body mass index (BMI) 36.0-36.9, adult: Secondary | ICD-10-CM | POA: Diagnosis not present

## 2022-07-30 DIAGNOSIS — Z8679 Personal history of other diseases of the circulatory system: Secondary | ICD-10-CM | POA: Diagnosis not present

## 2022-07-30 DIAGNOSIS — I150 Renovascular hypertension: Secondary | ICD-10-CM | POA: Diagnosis not present

## 2022-07-30 DIAGNOSIS — E669 Obesity, unspecified: Secondary | ICD-10-CM | POA: Diagnosis not present

## 2022-07-30 DIAGNOSIS — R262 Difficulty in walking, not elsewhere classified: Secondary | ICD-10-CM | POA: Diagnosis not present

## 2022-07-30 DIAGNOSIS — I4891 Unspecified atrial fibrillation: Secondary | ICD-10-CM | POA: Diagnosis not present

## 2022-08-12 ENCOUNTER — Ambulatory Visit (INDEPENDENT_AMBULATORY_CARE_PROVIDER_SITE_OTHER): Payer: HMO

## 2022-08-12 DIAGNOSIS — I495 Sick sinus syndrome: Secondary | ICD-10-CM

## 2022-08-13 DIAGNOSIS — N390 Urinary tract infection, site not specified: Secondary | ICD-10-CM | POA: Diagnosis not present

## 2022-08-13 DIAGNOSIS — B9689 Other specified bacterial agents as the cause of diseases classified elsewhere: Secondary | ICD-10-CM | POA: Diagnosis not present

## 2022-08-13 DIAGNOSIS — S91002A Unspecified open wound, left ankle, initial encounter: Secondary | ICD-10-CM | POA: Diagnosis not present

## 2022-08-13 DIAGNOSIS — I5042 Chronic combined systolic (congestive) and diastolic (congestive) heart failure: Secondary | ICD-10-CM | POA: Diagnosis not present

## 2022-08-13 DIAGNOSIS — M549 Dorsalgia, unspecified: Secondary | ICD-10-CM | POA: Diagnosis not present

## 2022-08-13 LAB — CUP PACEART REMOTE DEVICE CHECK
Battery Remaining Longevity: 150 mo
Battery Remaining Percentage: 100 %
Brady Statistic RA Percent Paced: 28 %
Brady Statistic RV Percent Paced: 8 %
Date Time Interrogation Session: 20240319023000
Implantable Lead Connection Status: 753985
Implantable Lead Connection Status: 753985
Implantable Lead Implant Date: 20210816
Implantable Lead Implant Date: 20210816
Implantable Lead Location: 753859
Implantable Lead Location: 753860
Implantable Lead Model: 7842
Implantable Lead Model: 7842
Implantable Lead Serial Number: 1013287
Implantable Lead Serial Number: 1091261
Implantable Pulse Generator Implant Date: 20210816
Lead Channel Impedance Value: 440 Ohm
Lead Channel Impedance Value: 687 Ohm
Lead Channel Setting Pacing Amplitude: 2.5 V
Lead Channel Setting Pacing Amplitude: 2.5 V
Lead Channel Setting Pacing Pulse Width: 0.4 ms
Lead Channel Setting Sensing Sensitivity: 2.5 mV
Pulse Gen Serial Number: 940432
Zone Setting Status: 755011

## 2022-08-18 DIAGNOSIS — R42 Dizziness and giddiness: Secondary | ICD-10-CM | POA: Diagnosis not present

## 2022-08-18 DIAGNOSIS — D6489 Other specified anemias: Secondary | ICD-10-CM | POA: Diagnosis not present

## 2022-08-18 DIAGNOSIS — M549 Dorsalgia, unspecified: Secondary | ICD-10-CM | POA: Diagnosis not present

## 2022-08-18 DIAGNOSIS — Z5181 Encounter for therapeutic drug level monitoring: Secondary | ICD-10-CM | POA: Diagnosis not present

## 2022-08-18 DIAGNOSIS — Z952 Presence of prosthetic heart valve: Secondary | ICD-10-CM | POA: Diagnosis not present

## 2022-08-18 DIAGNOSIS — Z7901 Long term (current) use of anticoagulants: Secondary | ICD-10-CM | POA: Diagnosis not present

## 2022-08-19 DIAGNOSIS — I480 Paroxysmal atrial fibrillation: Secondary | ICD-10-CM | POA: Diagnosis not present

## 2022-08-19 DIAGNOSIS — M6281 Muscle weakness (generalized): Secondary | ICD-10-CM | POA: Diagnosis not present

## 2022-08-19 DIAGNOSIS — R2689 Other abnormalities of gait and mobility: Secondary | ICD-10-CM | POA: Diagnosis not present

## 2022-08-20 DIAGNOSIS — R635 Abnormal weight gain: Secondary | ICD-10-CM | POA: Diagnosis not present

## 2022-08-20 DIAGNOSIS — R600 Localized edema: Secondary | ICD-10-CM | POA: Diagnosis not present

## 2022-08-20 DIAGNOSIS — M5441 Lumbago with sciatica, right side: Secondary | ICD-10-CM | POA: Diagnosis not present

## 2022-08-20 DIAGNOSIS — I5042 Chronic combined systolic (congestive) and diastolic (congestive) heart failure: Secondary | ICD-10-CM | POA: Diagnosis not present

## 2022-08-20 DIAGNOSIS — M5442 Lumbago with sciatica, left side: Secondary | ICD-10-CM | POA: Diagnosis not present

## 2022-08-20 DIAGNOSIS — M545 Low back pain, unspecified: Secondary | ICD-10-CM | POA: Diagnosis not present

## 2022-08-25 DIAGNOSIS — I480 Paroxysmal atrial fibrillation: Secondary | ICD-10-CM | POA: Diagnosis not present

## 2022-08-25 DIAGNOSIS — M6281 Muscle weakness (generalized): Secondary | ICD-10-CM | POA: Diagnosis not present

## 2022-08-25 DIAGNOSIS — R2689 Other abnormalities of gait and mobility: Secondary | ICD-10-CM | POA: Diagnosis not present

## 2022-08-28 DIAGNOSIS — E119 Type 2 diabetes mellitus without complications: Secondary | ICD-10-CM | POA: Diagnosis not present

## 2022-08-29 DIAGNOSIS — M5430 Sciatica, unspecified side: Secondary | ICD-10-CM | POA: Diagnosis not present

## 2022-08-29 DIAGNOSIS — E039 Hypothyroidism, unspecified: Secondary | ICD-10-CM | POA: Diagnosis not present

## 2022-08-29 DIAGNOSIS — N189 Chronic kidney disease, unspecified: Secondary | ICD-10-CM | POA: Diagnosis not present

## 2022-08-29 DIAGNOSIS — Z7984 Long term (current) use of oral hypoglycemic drugs: Secondary | ICD-10-CM | POA: Diagnosis not present

## 2022-08-29 DIAGNOSIS — Z66 Do not resuscitate: Secondary | ICD-10-CM | POA: Diagnosis not present

## 2022-08-29 DIAGNOSIS — I5042 Chronic combined systolic (congestive) and diastolic (congestive) heart failure: Secondary | ICD-10-CM | POA: Diagnosis not present

## 2022-08-29 DIAGNOSIS — M545 Low back pain, unspecified: Secondary | ICD-10-CM | POA: Diagnosis not present

## 2022-08-29 DIAGNOSIS — E1122 Type 2 diabetes mellitus with diabetic chronic kidney disease: Secondary | ICD-10-CM | POA: Diagnosis not present

## 2022-08-30 DIAGNOSIS — I10 Essential (primary) hypertension: Secondary | ICD-10-CM | POA: Diagnosis not present

## 2022-09-04 DIAGNOSIS — I4891 Unspecified atrial fibrillation: Secondary | ICD-10-CM | POA: Diagnosis not present

## 2022-09-04 DIAGNOSIS — R791 Abnormal coagulation profile: Secondary | ICD-10-CM | POA: Diagnosis not present

## 2022-09-04 DIAGNOSIS — Z952 Presence of prosthetic heart valve: Secondary | ICD-10-CM | POA: Diagnosis not present

## 2022-09-04 DIAGNOSIS — Z7901 Long term (current) use of anticoagulants: Secondary | ICD-10-CM | POA: Diagnosis not present

## 2022-09-11 DIAGNOSIS — I150 Renovascular hypertension: Secondary | ICD-10-CM | POA: Diagnosis not present

## 2022-09-11 DIAGNOSIS — N189 Chronic kidney disease, unspecified: Secondary | ICD-10-CM | POA: Diagnosis not present

## 2022-09-11 DIAGNOSIS — E1122 Type 2 diabetes mellitus with diabetic chronic kidney disease: Secondary | ICD-10-CM | POA: Diagnosis not present

## 2022-09-11 DIAGNOSIS — Z7984 Long term (current) use of oral hypoglycemic drugs: Secondary | ICD-10-CM | POA: Diagnosis not present

## 2022-09-11 DIAGNOSIS — M545 Low back pain, unspecified: Secondary | ICD-10-CM | POA: Diagnosis not present

## 2022-09-11 DIAGNOSIS — M5440 Lumbago with sciatica, unspecified side: Secondary | ICD-10-CM | POA: Diagnosis not present

## 2022-09-11 DIAGNOSIS — I5042 Chronic combined systolic (congestive) and diastolic (congestive) heart failure: Secondary | ICD-10-CM | POA: Diagnosis not present

## 2022-09-11 DIAGNOSIS — F319 Bipolar disorder, unspecified: Secondary | ICD-10-CM | POA: Diagnosis not present

## 2022-09-11 DIAGNOSIS — M546 Pain in thoracic spine: Secondary | ICD-10-CM | POA: Diagnosis not present

## 2022-09-15 DIAGNOSIS — M545 Low back pain, unspecified: Secondary | ICD-10-CM | POA: Diagnosis not present

## 2022-09-15 DIAGNOSIS — I509 Heart failure, unspecified: Secondary | ICD-10-CM | POA: Diagnosis not present

## 2022-09-15 DIAGNOSIS — M6281 Muscle weakness (generalized): Secondary | ICD-10-CM | POA: Diagnosis not present

## 2022-09-15 DIAGNOSIS — R52 Pain, unspecified: Secondary | ICD-10-CM | POA: Diagnosis not present

## 2022-09-15 DIAGNOSIS — M6249 Contracture of muscle, multiple sites: Secondary | ICD-10-CM | POA: Diagnosis not present

## 2022-09-15 DIAGNOSIS — R2681 Unsteadiness on feet: Secondary | ICD-10-CM | POA: Diagnosis not present

## 2022-09-15 DIAGNOSIS — Z9181 History of falling: Secondary | ICD-10-CM | POA: Diagnosis not present

## 2022-09-15 DIAGNOSIS — M6250 Muscle wasting and atrophy, not elsewhere classified, unspecified site: Secondary | ICD-10-CM | POA: Diagnosis not present

## 2022-09-15 DIAGNOSIS — F319 Bipolar disorder, unspecified: Secondary | ICD-10-CM | POA: Diagnosis not present

## 2022-09-23 NOTE — Progress Notes (Signed)
Remote pacemaker transmission.   

## 2022-09-24 DIAGNOSIS — Z87440 Personal history of urinary (tract) infections: Secondary | ICD-10-CM | POA: Diagnosis not present

## 2022-09-24 DIAGNOSIS — M5440 Lumbago with sciatica, unspecified side: Secondary | ICD-10-CM | POA: Diagnosis not present

## 2022-09-24 DIAGNOSIS — I5042 Chronic combined systolic (congestive) and diastolic (congestive) heart failure: Secondary | ICD-10-CM | POA: Diagnosis not present

## 2022-09-24 DIAGNOSIS — M5432 Sciatica, left side: Secondary | ICD-10-CM | POA: Diagnosis not present

## 2022-10-03 DIAGNOSIS — Z7901 Long term (current) use of anticoagulants: Secondary | ICD-10-CM | POA: Diagnosis not present

## 2022-10-03 DIAGNOSIS — Z7984 Long term (current) use of oral hypoglycemic drugs: Secondary | ICD-10-CM | POA: Diagnosis not present

## 2022-10-03 DIAGNOSIS — I5042 Chronic combined systolic (congestive) and diastolic (congestive) heart failure: Secondary | ICD-10-CM | POA: Diagnosis not present

## 2022-10-03 DIAGNOSIS — F319 Bipolar disorder, unspecified: Secondary | ICD-10-CM | POA: Diagnosis not present

## 2022-10-03 DIAGNOSIS — M5430 Sciatica, unspecified side: Secondary | ICD-10-CM | POA: Diagnosis not present

## 2022-10-03 DIAGNOSIS — N1832 Chronic kidney disease, stage 3b: Secondary | ICD-10-CM | POA: Diagnosis not present

## 2022-10-03 DIAGNOSIS — I4891 Unspecified atrial fibrillation: Secondary | ICD-10-CM | POA: Diagnosis not present

## 2022-10-03 DIAGNOSIS — Z952 Presence of prosthetic heart valve: Secondary | ICD-10-CM | POA: Diagnosis not present

## 2022-10-03 DIAGNOSIS — M545 Low back pain, unspecified: Secondary | ICD-10-CM | POA: Diagnosis not present

## 2022-10-10 DIAGNOSIS — M5430 Sciatica, unspecified side: Secondary | ICD-10-CM | POA: Diagnosis not present

## 2022-10-10 DIAGNOSIS — R062 Wheezing: Secondary | ICD-10-CM | POA: Diagnosis not present

## 2022-10-10 DIAGNOSIS — M545 Low back pain, unspecified: Secondary | ICD-10-CM | POA: Diagnosis not present

## 2022-10-10 DIAGNOSIS — I5042 Chronic combined systolic (congestive) and diastolic (congestive) heart failure: Secondary | ICD-10-CM | POA: Diagnosis not present

## 2022-10-10 DIAGNOSIS — I272 Pulmonary hypertension, unspecified: Secondary | ICD-10-CM | POA: Diagnosis not present

## 2022-10-13 DIAGNOSIS — I5042 Chronic combined systolic (congestive) and diastolic (congestive) heart failure: Secondary | ICD-10-CM | POA: Diagnosis not present

## 2022-10-13 DIAGNOSIS — Z7984 Long term (current) use of oral hypoglycemic drugs: Secondary | ICD-10-CM | POA: Diagnosis not present

## 2022-10-13 DIAGNOSIS — Z952 Presence of prosthetic heart valve: Secondary | ICD-10-CM | POA: Diagnosis not present

## 2022-10-13 DIAGNOSIS — Z7901 Long term (current) use of anticoagulants: Secondary | ICD-10-CM | POA: Diagnosis not present

## 2022-10-13 DIAGNOSIS — M545 Low back pain, unspecified: Secondary | ICD-10-CM | POA: Diagnosis not present

## 2022-10-13 DIAGNOSIS — F319 Bipolar disorder, unspecified: Secondary | ICD-10-CM | POA: Diagnosis not present

## 2022-10-13 DIAGNOSIS — M5430 Sciatica, unspecified side: Secondary | ICD-10-CM | POA: Diagnosis not present

## 2022-10-13 DIAGNOSIS — N1832 Chronic kidney disease, stage 3b: Secondary | ICD-10-CM | POA: Diagnosis not present

## 2022-10-13 DIAGNOSIS — I4891 Unspecified atrial fibrillation: Secondary | ICD-10-CM | POA: Diagnosis not present

## 2022-10-17 DIAGNOSIS — R0989 Other specified symptoms and signs involving the circulatory and respiratory systems: Secondary | ICD-10-CM | POA: Diagnosis not present

## 2022-10-21 DIAGNOSIS — Z7901 Long term (current) use of anticoagulants: Secondary | ICD-10-CM | POA: Diagnosis not present

## 2022-10-21 DIAGNOSIS — I4891 Unspecified atrial fibrillation: Secondary | ICD-10-CM | POA: Diagnosis not present

## 2022-10-23 ENCOUNTER — Other Ambulatory Visit (HOSPITAL_COMMUNITY): Payer: Self-pay | Admitting: Neurosurgery

## 2022-10-23 DIAGNOSIS — M5416 Radiculopathy, lumbar region: Secondary | ICD-10-CM | POA: Diagnosis not present

## 2022-11-03 DIAGNOSIS — F3132 Bipolar disorder, current episode depressed, moderate: Secondary | ICD-10-CM | POA: Diagnosis not present

## 2022-11-03 DIAGNOSIS — F5101 Primary insomnia: Secondary | ICD-10-CM | POA: Diagnosis not present

## 2022-11-11 ENCOUNTER — Ambulatory Visit (INDEPENDENT_AMBULATORY_CARE_PROVIDER_SITE_OTHER): Payer: HMO

## 2022-11-11 DIAGNOSIS — Z961 Presence of intraocular lens: Secondary | ICD-10-CM | POA: Diagnosis not present

## 2022-11-11 DIAGNOSIS — I495 Sick sinus syndrome: Secondary | ICD-10-CM

## 2022-11-11 DIAGNOSIS — E119 Type 2 diabetes mellitus without complications: Secondary | ICD-10-CM | POA: Diagnosis not present

## 2022-11-12 LAB — CUP PACEART REMOTE DEVICE CHECK
Battery Remaining Longevity: 144 mo
Battery Remaining Percentage: 100 %
Brady Statistic RA Percent Paced: 32 %
Brady Statistic RV Percent Paced: 8 %
Date Time Interrogation Session: 20240619023100
Implantable Lead Connection Status: 753985
Implantable Lead Connection Status: 753985
Implantable Lead Implant Date: 20210816
Implantable Lead Implant Date: 20210816
Implantable Lead Location: 753859
Implantable Lead Location: 753860
Implantable Lead Model: 7842
Implantable Lead Model: 7842
Implantable Lead Serial Number: 1013287
Implantable Lead Serial Number: 1091261
Implantable Pulse Generator Implant Date: 20210816
Lead Channel Impedance Value: 440 Ohm
Lead Channel Impedance Value: 692 Ohm
Lead Channel Setting Pacing Amplitude: 2.5 V
Lead Channel Setting Pacing Amplitude: 2.5 V
Lead Channel Setting Pacing Pulse Width: 0.4 ms
Lead Channel Setting Sensing Sensitivity: 2.5 mV
Pulse Gen Serial Number: 940432
Zone Setting Status: 755011

## 2022-11-20 DIAGNOSIS — M5440 Lumbago with sciatica, unspecified side: Secondary | ICD-10-CM | POA: Diagnosis not present

## 2022-11-21 ENCOUNTER — Ambulatory Visit (HOSPITAL_COMMUNITY)
Admission: RE | Admit: 2022-11-21 | Discharge: 2022-11-21 | Disposition: A | Discharge status: Home / Self Care | Payer: HMO | Source: Ambulatory Visit | Attending: Cardiology | Admitting: Cardiology

## 2022-11-21 ENCOUNTER — Telehealth (HOSPITAL_COMMUNITY): Payer: Self-pay

## 2022-11-21 ENCOUNTER — Encounter (HOSPITAL_COMMUNITY): Payer: Self-pay | Admitting: Cardiology

## 2022-11-21 VITALS — BP 110/68 | HR 60 | Wt 240.6 lb

## 2022-11-21 DIAGNOSIS — E785 Hyperlipidemia, unspecified: Secondary | ICD-10-CM | POA: Insufficient documentation

## 2022-11-21 DIAGNOSIS — Z7901 Long term (current) use of anticoagulants: Secondary | ICD-10-CM | POA: Insufficient documentation

## 2022-11-21 DIAGNOSIS — Z87891 Personal history of nicotine dependence: Secondary | ICD-10-CM | POA: Diagnosis not present

## 2022-11-21 DIAGNOSIS — E1122 Type 2 diabetes mellitus with diabetic chronic kidney disease: Secondary | ICD-10-CM | POA: Insufficient documentation

## 2022-11-21 DIAGNOSIS — I4819 Other persistent atrial fibrillation: Secondary | ICD-10-CM | POA: Diagnosis not present

## 2022-11-21 DIAGNOSIS — Z993 Dependence on wheelchair: Secondary | ICD-10-CM | POA: Insufficient documentation

## 2022-11-21 DIAGNOSIS — I428 Other cardiomyopathies: Secondary | ICD-10-CM | POA: Diagnosis not present

## 2022-11-21 DIAGNOSIS — I5022 Chronic systolic (congestive) heart failure: Secondary | ICD-10-CM | POA: Insufficient documentation

## 2022-11-21 DIAGNOSIS — I130 Hypertensive heart and chronic kidney disease with heart failure and stage 1 through stage 4 chronic kidney disease, or unspecified chronic kidney disease: Secondary | ICD-10-CM | POA: Insufficient documentation

## 2022-11-21 DIAGNOSIS — Z79899 Other long term (current) drug therapy: Secondary | ICD-10-CM | POA: Diagnosis not present

## 2022-11-21 DIAGNOSIS — N183 Chronic kidney disease, stage 3 unspecified: Secondary | ICD-10-CM | POA: Insufficient documentation

## 2022-11-21 DIAGNOSIS — Z952 Presence of prosthetic heart valve: Secondary | ICD-10-CM | POA: Insufficient documentation

## 2022-11-21 DIAGNOSIS — Z833 Family history of diabetes mellitus: Secondary | ICD-10-CM | POA: Insufficient documentation

## 2022-11-21 DIAGNOSIS — Z8249 Family history of ischemic heart disease and other diseases of the circulatory system: Secondary | ICD-10-CM | POA: Diagnosis not present

## 2022-11-21 LAB — COMPREHENSIVE METABOLIC PANEL
ALT: 16 U/L (ref 0–44)
AST: 27 U/L (ref 15–41)
Albumin: 4 g/dL (ref 3.5–5.0)
Alkaline Phosphatase: 25 U/L — ABNORMAL LOW (ref 38–126)
Anion gap: 9 (ref 5–15)
BUN: 38 mg/dL — ABNORMAL HIGH (ref 8–23)
CO2: 27 mmol/L (ref 22–32)
Calcium: 9.4 mg/dL (ref 8.9–10.3)
Chloride: 103 mmol/L (ref 98–111)
Creatinine, Ser: 2.02 mg/dL — ABNORMAL HIGH (ref 0.44–1.00)
GFR, Estimated: 26 mL/min — ABNORMAL LOW (ref >60)
Glucose, Bld: 88 mg/dL (ref 70–99)
Potassium: 4.8 mmol/L (ref 3.5–5.1)
Sodium: 139 mmol/L (ref 135–145)
Total Bilirubin: 0.7 mg/dL (ref 0.3–1.2)
Total Protein: 7 g/dL (ref 6.5–8.1)

## 2022-11-21 LAB — CBC
HCT: 36.3 % (ref 36.0–46.0)
Hemoglobin: 11.5 g/dL — ABNORMAL LOW (ref 12.0–15.0)
MCH: 30.5 pg (ref 26.0–34.0)
MCHC: 31.7 g/dL (ref 30.0–36.0)
MCV: 96.3 fL (ref 80.0–100.0)
Platelets: 266 10*3/uL (ref 150–400)
RBC: 3.77 MIL/uL — ABNORMAL LOW (ref 3.87–5.11)
RDW: 14.3 % (ref 11.5–15.5)
WBC: 5.2 10*3/uL (ref 4.0–10.5)
nRBC: 0 % (ref 0.0–0.2)

## 2022-11-21 LAB — LIPID PANEL
Cholesterol: 150 mg/dL (ref 0–200)
HDL: 44 mg/dL (ref >40)
LDL Cholesterol: 70 mg/dL (ref 0–99)
Total CHOL/HDL Ratio: 3.4 RATIO
Triglycerides: 180 mg/dL — ABNORMAL HIGH (ref <150)
VLDL: 36 mg/dL (ref 0–40)

## 2022-11-21 LAB — TSH: TSH: 4.03 u[IU]/mL (ref 0.350–4.500)

## 2022-11-21 LAB — BRAIN NATRIURETIC PEPTIDE: B Natriuretic Peptide: 119 pg/mL — ABNORMAL HIGH (ref 0.0–100.0)

## 2022-11-21 MED ORDER — FUROSEMIDE 20 MG PO TABS: 60.0000 mg | ORAL_TABLET | Freq: Every day | ORAL | 3 refills | Status: DC

## 2022-11-21 NOTE — Patient Instructions (Signed)
CHANGE Lasix to 60 mg EVERY MORNING.  DO NOT HOLD ANY CARDIAC MEDICATIONS UNLESS SBP < 90 AFTER THE MEDICATION IS GIVEN. CHECK BLOOD PRESSURE 45 MINUTES AFTER MEDICATION IS GIVEN NOT BEFORE.  Labs done today, your results will be available in MyChart, we will contact you for abnormal readings.  Your physician has requested that you have an echocardiogram. Echocardiography is a painless test that uses sound waves to create images of your heart. It provides your doctor with information about the size and shape of your heart and how well your heart's chambers and valves are working. This procedure takes approximately one hour. There are no restrictions for this procedure. Please do NOT wear cologne, perfume, aftershave, or lotions (deodorant is allowed). Please arrive 15 minutes prior to your appointment time.  Your physician recommends that you schedule a follow-up appointment in: 3 MONTHS  If you have any questions or concerns before your next appointment please send [place] a message through [place] or call our office at [phone].    TO LEAVE A MESSAGE FOR THE NURSE SELECT OPTION 2, PLEASE LEAVE A MESSAGE INCLUDING: YOUR NAME DATE OF BIRTH CALL BACK NUMBER REASON FOR CALL**this is important as we prioritize the call backs  YOU WILL RECEIVE A CALL BACK THE SAME DAY AS LONG AS YOU CALL BEFORE 4:00 PM  At the [facility], you and your health needs are our priority. As part of our continuing mission to provide you with exceptional heart care, we have created designated Provider Care Teams. These Care Teams include your primary Cardiologist (physician) and Advanced Practice Providers (APPs- Physician Assistants and Nurse Practitioners) who all work together to provide you with the care you need, when you need it.   You may see any of the following providers on your designated Care Team at your next follow up: Dr Arvilla Meres Dr Marca Ancona Dr. Marcos Eke,  NP Robbie Lis, [place] [facility] [place], [place] Brynda Peon, NP Karle Plumber, PharmD   Please be sure to bring in all your medications bottles to every appointment.    Thank you for choosing [facility]

## 2022-11-21 NOTE — Telephone Encounter (Addendum)
Orders faxed to [facility] place at [phone]  ----- Message from Laurey Morale, MD sent at 11/21/2022 12:18 PM EDT ----- Volume overloaded but creatinine is higher.  Decrease Entresto to 24/26 bid. BMET in 1 week. Will need new order for SNF.

## 2022-11-22 NOTE — Progress Notes (Signed)
[facility] Note  PCP: Dr Abner Greenspan  Primary Cardiologist: Dr Katrinka Blazing  HF Cardiology: Dr. Shirlee Latch  HPI: [name] is a 72 y.o. female with a hx of bicuspid AV/AS with ascending aortic aneurysm s/p resection/grafting of ascending aortic aneurysm and placement of St. Jude mechanical AV conduit/Bentall 2009, persistent atrial fib, bradycardia s/p [employer]  PPM implantation 12/2019, CVAs, multiple falls with prior craniotomy for subdural hematoma, prior chronic CVAs on MRI 2021, bipolar disorder, anemia, depression, debilitation (wheelchair bound), DM, HLD, HTN, mild carotid artery disease 2021, back pain, obesity, memory impairment, and chronic HFrEF.   Admitted 06/2021 with increased shortness of breath in the setting of acute HFrEF. Diuresed with IV lasix. Echo repeated EF 25-30%. Had cath with patent coronaries. Started on [place]. Discharged to [facility].   She appears to have gone back into atrial fibrillation some time between 5/22 and 12/22 after amiodarone was stopped. She has had 3 bad falls (12/21, 2/22, 4/22) resulting in subdural hematomas.  She is, however, still on warfarin.    Follow up 4/23, NYHA II-III, Entresto increased. Remained in AF and arranged for DCCV. S/p TEE/DCCV 09/23/21 to NSR.  Today she returns for HF follow up with her daughter. She is limited by low back pain and knee/hip pain and does minimal walking.  Generally in a wheelchair. She is not short of breath with transfers.  No palpitations.  Weight up 3 lbs.  She says that the SNF often does not give her her medications due to blood pressure.   ECG (personally reviewed): a-paced, long PR, septal Qs, TW flattening diffuse.   Labs (2/23): K 4.1, creatinine 1.68, hgb 10.8 Labs (3/23): K 4.2, creatinine 1.69, hgb 10.8, LFTs normal, TSH normal.  Labs (4/23): K 4.3, creatinine 1.62 Labs (2/24): K 4.5, creatinine 1.56, BNP 116  PMH: 1. Bicuspid aortic valve with ascending aortic  aneurysm: S/p St Jude mechanical aortic valve with Bentall procedure in 2009 2. Atrial fibrillation: Persistent. DCCV to NSR in 12/21.  Had maintained NSR on amiodarone but was stopped.  - s/p TEE/DCCV 5/23 to NSR 3. Bradycardia: [employer] PPM in 3/21.  4. CVAs: Now wheelchair-bound.  5. Multiple falls: 12/21 with subdural hematoma requiring craniotomy, 2/22 with subdural, 4/22 with subdural.  6. Bipolar disorder 7. Dementia 8. Hypothyroidism 9. CKD stage 3 10. Chronic systolic CHF: Nonischemic cardiomyopathy.  - Echo (11/22): EF 40-45%.  - LHC/RHC (2/23): No CAD; mean RA 12, PA 45/22, PCWP mean 25, CI 3.5 - Echo (2/23): EF 25-30%, global HK, RV normal, mild MR, St Jude mechanical AVR with normal function.  - TEE (5/23): EF 35-40%, RV mildly reduced, mild to moderate MR, St Jude mechanical AVR with normal function. 11. Hyperlipidemia 12. CKD stage 3   Review of Systems: All systems reviewed and negative except as per HPI.   Current Outpatient Medications  Medication Sig Dispense Refill   albuterol (VENTOLIN HFA) 108 (90 Base) MCG/ACT inhaler Inhale 2 puffs into the lungs at bedtime.     amiodarone (PACERONE) 200 MG tablet Take 200 mg by mouth daily.     carvedilol (COREG) 6.25 MG tablet Take 6.25 mg by mouth 2 (two) times daily with a meal.     cholecalciferol (VITAMIN D) 25 MCG tablet Take 1 tablet (1,000 Units total) by mouth daily.     dapagliflozin propanediol (FARXIGA) 10 MG TABS tablet Take 1 tablet (10 mg total) by mouth daily. 30 tablet 0  diclofenac Sodium (VOLTAREN) 1 % GEL Apply 2 g topically 2 (two) times daily. Apply to Knee     fenofibrate 160 MG tablet Take 1 tablet (160 mg total) by mouth daily.     fexofenadine (ALLEGRA) 60 MG tablet Take 60 mg by mouth daily.     lamoTRIgine (LAMICTAL) 100 MG tablet Take 100 mg by mouth daily.     lamoTRIgine (LAMICTAL) 200 MG tablet Take 200 mg by mouth at bedtime.     levalbuterol (XOPENEX) 0.63 MG/3ML nebulizer solution  Take 0.63 mg by nebulization 2 (two) times daily.     levothyroxine (SYNTHROID) 25 MCG tablet TAKE 1 TABLET BY MOUTH DAILY BEFORE BREAKFAST. 90 tablet 0   lidocaine 4 % Place 1 patch onto the skin daily. Apply every morning  Off every night     meclizine (ANTIVERT) 25 MG tablet Take 25 mg by mouth as needed for dizziness.     melatonin 5 MG TABS Take 10 mg by mouth at bedtime.     Menthol, Topical Analgesic, (BIOFREEZE PROFESSIONAL) 5 % GEL Apply 1 application. topically 2 (two) times daily. Apply thin layer to right knee for pain     methocarbamol (ROBAXIN) 500 MG tablet Take 500 mg by mouth as needed for muscle spasms.     pantoprazole (PROTONIX) 40 MG tablet Take 1 tablet (40 mg total) by mouth daily.     potassium chloride SA (KLOR-CON M) 20 MEQ tablet Take 1 tablet (20 mEq total) by mouth daily.     rosuvastatin (CRESTOR) 40 MG tablet Take 40 mg by mouth daily.     sacubitril-valsartan (ENTRESTO) 49-51 MG Take 1 tablet by mouth 2 (two) times daily. 60 tablet 11   spironolactone (ALDACTONE) 25 MG tablet Take 1 tablet (25 mg total) by mouth daily. 30 tablet 11   traZODone (DESYREL) 150 MG tablet Take 150 mg by mouth at bedtime.     venlafaxine XR (EFFEXOR XR) 75 MG 24 hr capsule Take 1 capsule (75 mg total) by mouth daily with breakfast. 90 capsule 1   vitamin B-12 (CYANOCOBALAMIN) 1000 MCG tablet Take 1,000 mcg by mouth daily.     warfarin (COUMADIN) 2 MG tablet Take 2 mg by mouth daily.     furosemide (LASIX) 20 MG tablet Take 3 tablets (60 mg total) by mouth daily. 180 tablet 3   No current facility-administered medications for this encounter.   Allergies  Allergen Reactions   Mucinex [Guaifenesin Er] Other (See Comments)    Severe headaches    Keflex [Cephalexin] Other (See Comments)    Headaches and dizziness   Keflex [Cephalexin] Other (See Comments)    Headaches and/or dizziness- "allergic," per MAR   Mucinex [Guaifenesin Er] Other (See Comments)    Severe headaches-  "allergic," per MAR   Sulfa Antibiotics Other (See Comments)    headaches   Sulfa Antibiotics Other (See Comments)    Headaches- "allergic," per MAR    Sulfonamide Derivatives Other (See Comments)    Headaches    Social History   Socioeconomic History   Marital status: Married    Spouse name: Not on file   Number of children: Not on file   Years of education: Not on file   Highest education level: Not on file  Occupational History   Not on file  Tobacco Use   Smoking status: Former    Types: Cigarettes    Quit date: 05/26/1990    Years since quitting: 32.5   Smokeless tobacco: Never  Vaping Use   Vaping Use: Never used  Substance and Sexual Activity   Alcohol use: Yes    Alcohol/week: 0.0 standard drinks of alcohol    Comment: rare   Drug use: No   Sexual activity: Not on file  Other Topics Concern   Not on file  Social History Narrative   ** Merged History Encounter **       Social Determinants of Health   Financial Resource Strain: Not on file  Food Insecurity: Not on file  Transportation Needs: Not on file  Physical Activity: Not on file  Stress: Not on file  Social Connections: Not on file  Intimate Partner Violence: Not on file   Family History  Problem Relation Age of Onset   Scoliosis Mother    Arthritis Mother        Rheumatoid   Aneurysm Mother        heart   Alcohol abuse Father    Depression Sister    Depression Brother    Alcohol abuse Brother    Cancer Maternal Grandmother        colon   Diabetes Maternal Grandfather    Heart disease Maternal Grandfather    Alcohol abuse Maternal Grandfather    Depression Paternal Grandmother    Diabetes Paternal Grandmother    Depression Paternal Grandfather    Diabetes Paternal Grandfather    Depression Sister    Alcohol abuse Brother    Depression Brother    Heart disease Brother    BP 110/68   Pulse 60   Wt 109.1 kg (240 lb 9.6 oz)   SpO2 98%   BMI 36.58 kg/m   Wt Readings from Last 3  Encounters:  11/21/22 109.1 kg (240 lb 9.6 oz)  07/04/22 107.5 kg (237 lb)  04/03/22 106.1 kg (234 lb)   General: NAD Neck: JVP 8-9 cm, no thyromegaly or thyroid nodule.  Lungs: Clear to auscultation bilaterally with normal respiratory effort. CV: Nondisplaced PMI.  Heart regular S1/S2 with mechanical S1, no S3/S4, 2/6 SEM RUSB.  1+ edema to knees.  No carotid bruit.  Normal pedal pulses.  Abdomen: Soft, nontender, no hepatosplenomegaly, no distention.  Skin: Intact without lesions or rashes.  Neurologic: Alert and oriented x 3.  Psych: Normal affect. Extremities: No clubbing or cyanosis.  HEENT: Normal.   ASSESSMENT & PLAN: 1. Chronic systolic CHF: Nonischemic cardiomyopathy.  Cath 06/2020 without significant coronary disease.  06/2020 echo with EF 25-30%, previous echo 03/2021 with EF 40-45%. She has a Environmental manager PPM but rarely paces her RV.  TEE (5/23) EF 35-40%. She is at least mildly volume overloaded.  Probably NYHA class III symptoms, but limited primarily by orthopedic issues.  - Increase Lasix to 60 mg daily with BMET/BNP today and BMET in 10 days.   - Continue carvedilol 6.25 mg bid. - Continue Entresto 49/51 mg bid.  - Continue spironolactone 25 mg daily.  - Continue Farxiga 10 mg daily.  - She should get her meds as long as SBP > 90.  - Repeat echo at followup in 3 months.  2. CKD Stage III: BMET today.  3. Atrial fibrillation: Paroxysmal.  Patient was in NSR in 5/22 but was in atrial fibrillation in 2/23. Her worsening HF may be due at least in part to development of persistent AF. S/p TEE/DCCV to NSR 5/23. She remains in NSR today.  - Continue amiodarone 200 mg daily. Check LFTs and TSH. She will need a regular eye exam.  -  Continue warfarin.  4. Mechanical Aortic Valve: On warfarin, goal INR 2.5-3.5 with h/o atrial fibrillation.  Stable valve on 2/23 echo and TEE 5/23. 5. Tachy/Brady Syndrome: [employer] PPM, she does not pace her RV enough to benefit from CRT  upgrade.  6. Falls: Frequent in past with SDH.  Now in SNF, seems to be doing better with no recent falls.  She has remained on warfarin given mechanical AoV.   Follow up in 3 months with echo  Marca Ancona  11/22/2022

## 2022-12-03 NOTE — Progress Notes (Signed)
Remote pacemaker transmission.   

## 2022-12-04 DIAGNOSIS — Z7901 Long term (current) use of anticoagulants: Secondary | ICD-10-CM | POA: Diagnosis not present

## 2022-12-04 DIAGNOSIS — I5042 Chronic combined systolic (congestive) and diastolic (congestive) heart failure: Secondary | ICD-10-CM | POA: Diagnosis not present

## 2022-12-04 DIAGNOSIS — N1832 Chronic kidney disease, stage 3b: Secondary | ICD-10-CM | POA: Diagnosis not present

## 2022-12-04 DIAGNOSIS — E039 Hypothyroidism, unspecified: Secondary | ICD-10-CM | POA: Diagnosis not present

## 2022-12-04 DIAGNOSIS — D631 Anemia in chronic kidney disease: Secondary | ICD-10-CM | POA: Diagnosis not present

## 2022-12-04 DIAGNOSIS — F319 Bipolar disorder, unspecified: Secondary | ICD-10-CM | POA: Diagnosis not present

## 2022-12-04 DIAGNOSIS — E785 Hyperlipidemia, unspecified: Secondary | ICD-10-CM | POA: Diagnosis not present

## 2022-12-04 DIAGNOSIS — M5440 Lumbago with sciatica, unspecified side: Secondary | ICD-10-CM | POA: Diagnosis not present

## 2022-12-04 DIAGNOSIS — I272 Pulmonary hypertension, unspecified: Secondary | ICD-10-CM | POA: Diagnosis not present

## 2022-12-04 DIAGNOSIS — K219 Gastro-esophageal reflux disease without esophagitis: Secondary | ICD-10-CM | POA: Diagnosis not present

## 2022-12-04 DIAGNOSIS — R42 Dizziness and giddiness: Secondary | ICD-10-CM | POA: Diagnosis not present

## 2022-12-04 DIAGNOSIS — E1122 Type 2 diabetes mellitus with diabetic chronic kidney disease: Secondary | ICD-10-CM | POA: Diagnosis not present

## 2022-12-05 DIAGNOSIS — M549 Dorsalgia, unspecified: Secondary | ICD-10-CM | POA: Diagnosis not present

## 2022-12-05 DIAGNOSIS — Z952 Presence of prosthetic heart valve: Secondary | ICD-10-CM | POA: Diagnosis not present

## 2022-12-05 DIAGNOSIS — Z5181 Encounter for therapeutic drug level monitoring: Secondary | ICD-10-CM | POA: Diagnosis not present

## 2022-12-05 DIAGNOSIS — N390 Urinary tract infection, site not specified: Secondary | ICD-10-CM | POA: Diagnosis not present

## 2022-12-05 DIAGNOSIS — Z8679 Personal history of other diseases of the circulatory system: Secondary | ICD-10-CM | POA: Diagnosis not present

## 2022-12-05 DIAGNOSIS — Z7901 Long term (current) use of anticoagulants: Secondary | ICD-10-CM | POA: Diagnosis not present

## 2022-12-09 DIAGNOSIS — E039 Hypothyroidism, unspecified: Secondary | ICD-10-CM | POA: Diagnosis not present

## 2022-12-09 DIAGNOSIS — I480 Paroxysmal atrial fibrillation: Secondary | ICD-10-CM | POA: Diagnosis not present

## 2022-12-09 DIAGNOSIS — I10 Essential (primary) hypertension: Secondary | ICD-10-CM | POA: Diagnosis not present

## 2022-12-17 DIAGNOSIS — N1832 Chronic kidney disease, stage 3b: Secondary | ICD-10-CM | POA: Diagnosis not present

## 2022-12-17 DIAGNOSIS — M5441 Lumbago with sciatica, right side: Secondary | ICD-10-CM | POA: Diagnosis not present

## 2022-12-17 DIAGNOSIS — H6503 Acute serous otitis media, bilateral: Secondary | ICD-10-CM | POA: Diagnosis not present

## 2022-12-17 DIAGNOSIS — E039 Hypothyroidism, unspecified: Secondary | ICD-10-CM | POA: Diagnosis not present

## 2022-12-17 DIAGNOSIS — E785 Hyperlipidemia, unspecified: Secondary | ICD-10-CM | POA: Diagnosis not present

## 2022-12-17 DIAGNOSIS — M5442 Lumbago with sciatica, left side: Secondary | ICD-10-CM | POA: Diagnosis not present

## 2022-12-23 DIAGNOSIS — M545 Low back pain, unspecified: Secondary | ICD-10-CM | POA: Diagnosis not present

## 2022-12-23 DIAGNOSIS — Z7901 Long term (current) use of anticoagulants: Secondary | ICD-10-CM | POA: Diagnosis not present

## 2022-12-23 DIAGNOSIS — I4891 Unspecified atrial fibrillation: Secondary | ICD-10-CM | POA: Diagnosis not present

## 2022-12-23 DIAGNOSIS — E039 Hypothyroidism, unspecified: Secondary | ICD-10-CM | POA: Diagnosis not present

## 2022-12-23 DIAGNOSIS — G8929 Other chronic pain: Secondary | ICD-10-CM | POA: Diagnosis not present

## 2022-12-25 DIAGNOSIS — M5441 Lumbago with sciatica, right side: Secondary | ICD-10-CM | POA: Diagnosis not present

## 2022-12-25 DIAGNOSIS — Z87440 Personal history of urinary (tract) infections: Secondary | ICD-10-CM | POA: Diagnosis not present

## 2022-12-25 DIAGNOSIS — H6503 Acute serous otitis media, bilateral: Secondary | ICD-10-CM | POA: Diagnosis not present

## 2022-12-25 DIAGNOSIS — M5442 Lumbago with sciatica, left side: Secondary | ICD-10-CM | POA: Diagnosis not present

## 2023-01-05 ENCOUNTER — Ambulatory Visit (HOSPITAL_COMMUNITY)
Admission: RE | Admit: 2023-01-05 | Discharge: 2023-01-05 | Disposition: A | Discharge status: Home / Self Care | Payer: PPO | Source: Ambulatory Visit | Attending: Neurosurgery | Admitting: Neurosurgery

## 2023-01-05 DIAGNOSIS — M47816 Spondylosis without myelopathy or radiculopathy, lumbar region: Secondary | ICD-10-CM | POA: Diagnosis not present

## 2023-01-05 DIAGNOSIS — K5730 Diverticulosis of large intestine without perforation or abscess without bleeding: Secondary | ICD-10-CM | POA: Diagnosis not present

## 2023-01-05 DIAGNOSIS — M5442 Lumbago with sciatica, left side: Secondary | ICD-10-CM | POA: Diagnosis not present

## 2023-01-05 DIAGNOSIS — M5441 Lumbago with sciatica, right side: Secondary | ICD-10-CM | POA: Diagnosis not present

## 2023-01-05 DIAGNOSIS — M4807 Spinal stenosis, lumbosacral region: Secondary | ICD-10-CM | POA: Diagnosis not present

## 2023-01-05 DIAGNOSIS — M5416 Radiculopathy, lumbar region: Secondary | ICD-10-CM | POA: Insufficient documentation

## 2023-01-05 DIAGNOSIS — I5042 Chronic combined systolic (congestive) and diastolic (congestive) heart failure: Secondary | ICD-10-CM | POA: Diagnosis not present

## 2023-01-05 DIAGNOSIS — M48061 Spinal stenosis, lumbar region without neurogenic claudication: Secondary | ICD-10-CM | POA: Diagnosis not present

## 2023-01-05 DIAGNOSIS — R410 Disorientation, unspecified: Secondary | ICD-10-CM | POA: Diagnosis not present

## 2023-01-05 DIAGNOSIS — H6503 Acute serous otitis media, bilateral: Secondary | ICD-10-CM | POA: Diagnosis not present

## 2023-01-05 DIAGNOSIS — W19XXXA Unspecified fall, initial encounter: Secondary | ICD-10-CM | POA: Diagnosis not present

## 2023-01-08 DIAGNOSIS — I10 Essential (primary) hypertension: Secondary | ICD-10-CM | POA: Diagnosis not present

## 2023-01-08 DIAGNOSIS — Z5181 Encounter for therapeutic drug level monitoring: Secondary | ICD-10-CM | POA: Diagnosis not present

## 2023-01-08 DIAGNOSIS — R791 Abnormal coagulation profile: Secondary | ICD-10-CM | POA: Diagnosis not present

## 2023-01-08 DIAGNOSIS — Z952 Presence of prosthetic heart valve: Secondary | ICD-10-CM | POA: Diagnosis not present

## 2023-01-08 DIAGNOSIS — I4891 Unspecified atrial fibrillation: Secondary | ICD-10-CM | POA: Diagnosis not present

## 2023-01-08 DIAGNOSIS — Z87828 Personal history of other (healed) physical injury and trauma: Secondary | ICD-10-CM | POA: Diagnosis not present

## 2023-01-09 ENCOUNTER — Encounter (HOSPITAL_COMMUNITY): Payer: Self-pay

## 2023-01-09 ENCOUNTER — Other Ambulatory Visit: Payer: Self-pay

## 2023-01-09 ENCOUNTER — Inpatient Hospital Stay (HOSPITAL_COMMUNITY)
Admission: EM | Admit: 2023-01-09 | Discharge: 2023-01-13 | DRG: 683 | Disposition: A | Discharge status: Skilled Nursing Facility | Payer: PPO | Source: Skilled Nursing Facility | Attending: Internal Medicine | Admitting: Internal Medicine

## 2023-01-09 ENCOUNTER — Emergency Department (HOSPITAL_COMMUNITY): Payer: PPO

## 2023-01-09 DIAGNOSIS — E1122 Type 2 diabetes mellitus with diabetic chronic kidney disease: Secondary | ICD-10-CM | POA: Diagnosis not present

## 2023-01-09 DIAGNOSIS — N179 Acute kidney failure, unspecified: Secondary | ICD-10-CM | POA: Diagnosis not present

## 2023-01-09 DIAGNOSIS — E875 Hyperkalemia: Secondary | ICD-10-CM | POA: Diagnosis present

## 2023-01-09 DIAGNOSIS — Z79899 Other long term (current) drug therapy: Secondary | ICD-10-CM

## 2023-01-09 DIAGNOSIS — G8929 Other chronic pain: Secondary | ICD-10-CM | POA: Diagnosis present

## 2023-01-09 DIAGNOSIS — Z90710 Acquired absence of both cervix and uterus: Secondary | ICD-10-CM

## 2023-01-09 DIAGNOSIS — F319 Bipolar disorder, unspecified: Secondary | ICD-10-CM | POA: Diagnosis present

## 2023-01-09 DIAGNOSIS — R9431 Abnormal electrocardiogram [ECG] [EKG]: Secondary | ICD-10-CM | POA: Diagnosis present

## 2023-01-09 DIAGNOSIS — E1142 Type 2 diabetes mellitus with diabetic polyneuropathy: Secondary | ICD-10-CM | POA: Diagnosis present

## 2023-01-09 DIAGNOSIS — I10 Essential (primary) hypertension: Secondary | ICD-10-CM | POA: Diagnosis present

## 2023-01-09 DIAGNOSIS — Z952 Presence of prosthetic heart valve: Secondary | ICD-10-CM | POA: Diagnosis not present

## 2023-01-09 DIAGNOSIS — E785 Hyperlipidemia, unspecified: Secondary | ICD-10-CM

## 2023-01-09 DIAGNOSIS — Z8249 Family history of ischemic heart disease and other diseases of the circulatory system: Secondary | ICD-10-CM

## 2023-01-09 DIAGNOSIS — I130 Hypertensive heart and chronic kidney disease with heart failure and stage 1 through stage 4 chronic kidney disease, or unspecified chronic kidney disease: Secondary | ICD-10-CM | POA: Diagnosis present

## 2023-01-09 DIAGNOSIS — I4891 Unspecified atrial fibrillation: Secondary | ICD-10-CM | POA: Diagnosis present

## 2023-01-09 DIAGNOSIS — Z800 Family history of malignant neoplasm of digestive organs: Secondary | ICD-10-CM

## 2023-01-09 DIAGNOSIS — Z6836 Body mass index (BMI) 36.0-36.9, adult: Secondary | ICD-10-CM

## 2023-01-09 DIAGNOSIS — E039 Hypothyroidism, unspecified: Secondary | ICD-10-CM | POA: Diagnosis present

## 2023-01-09 DIAGNOSIS — R4182 Altered mental status, unspecified: Secondary | ICD-10-CM | POA: Diagnosis not present

## 2023-01-09 DIAGNOSIS — R296 Repeated falls: Secondary | ICD-10-CM | POA: Diagnosis not present

## 2023-01-09 DIAGNOSIS — Z9049 Acquired absence of other specified parts of digestive tract: Secondary | ICD-10-CM

## 2023-01-09 DIAGNOSIS — Z79890 Hormone replacement therapy (postmenopausal): Secondary | ICD-10-CM

## 2023-01-09 DIAGNOSIS — K219 Gastro-esophageal reflux disease without esophagitis: Secondary | ICD-10-CM | POA: Diagnosis present

## 2023-01-09 DIAGNOSIS — Z8679 Personal history of other diseases of the circulatory system: Secondary | ICD-10-CM | POA: Diagnosis not present

## 2023-01-09 DIAGNOSIS — Z993 Dependence on wheelchair: Secondary | ICD-10-CM

## 2023-01-09 DIAGNOSIS — I5042 Chronic combined systolic (congestive) and diastolic (congestive) heart failure: Secondary | ICD-10-CM | POA: Diagnosis present

## 2023-01-09 DIAGNOSIS — W19XXXA Unspecified fall, initial encounter: Secondary | ICD-10-CM | POA: Diagnosis not present

## 2023-01-09 DIAGNOSIS — E1169 Type 2 diabetes mellitus with other specified complication: Secondary | ICD-10-CM | POA: Diagnosis not present

## 2023-01-09 DIAGNOSIS — I4819 Other persistent atrial fibrillation: Secondary | ICD-10-CM | POA: Diagnosis present

## 2023-01-09 DIAGNOSIS — Z66 Do not resuscitate: Secondary | ICD-10-CM | POA: Diagnosis present

## 2023-01-09 DIAGNOSIS — Z811 Family history of alcohol abuse and dependence: Secondary | ICD-10-CM

## 2023-01-09 DIAGNOSIS — E669 Obesity, unspecified: Secondary | ICD-10-CM | POA: Diagnosis not present

## 2023-01-09 DIAGNOSIS — M5442 Lumbago with sciatica, left side: Secondary | ICD-10-CM | POA: Diagnosis not present

## 2023-01-09 DIAGNOSIS — R29818 Other symptoms and signs involving the nervous system: Secondary | ICD-10-CM | POA: Diagnosis not present

## 2023-01-09 DIAGNOSIS — E860 Dehydration: Secondary | ICD-10-CM | POA: Diagnosis present

## 2023-01-09 DIAGNOSIS — H5710 Ocular pain, unspecified eye: Secondary | ICD-10-CM | POA: Diagnosis not present

## 2023-01-09 DIAGNOSIS — R531 Weakness: Secondary | ICD-10-CM | POA: Diagnosis not present

## 2023-01-09 DIAGNOSIS — N1832 Chronic kidney disease, stage 3b: Secondary | ICD-10-CM | POA: Diagnosis present

## 2023-01-09 DIAGNOSIS — Z85828 Personal history of other malignant neoplasm of skin: Secondary | ICD-10-CM

## 2023-01-09 DIAGNOSIS — M5441 Lumbago with sciatica, right side: Secondary | ICD-10-CM | POA: Diagnosis not present

## 2023-01-09 DIAGNOSIS — Z8673 Personal history of transient ischemic attack (TIA), and cerebral infarction without residual deficits: Secondary | ICD-10-CM | POA: Diagnosis not present

## 2023-01-09 DIAGNOSIS — F0394 Unspecified dementia, unspecified severity, with anxiety: Secondary | ICD-10-CM | POA: Diagnosis present

## 2023-01-09 DIAGNOSIS — Z8261 Family history of arthritis: Secondary | ICD-10-CM

## 2023-01-09 DIAGNOSIS — I480 Paroxysmal atrial fibrillation: Secondary | ICD-10-CM

## 2023-01-09 DIAGNOSIS — I959 Hypotension, unspecified: Secondary | ICD-10-CM | POA: Diagnosis not present

## 2023-01-09 DIAGNOSIS — H538 Other visual disturbances: Secondary | ICD-10-CM | POA: Diagnosis not present

## 2023-01-09 DIAGNOSIS — Z881 Allergy status to other antibiotic agents status: Secondary | ICD-10-CM

## 2023-01-09 DIAGNOSIS — I7121 Aneurysm of the ascending aorta, without rupture: Secondary | ICD-10-CM | POA: Diagnosis present

## 2023-01-09 DIAGNOSIS — Z87891 Personal history of nicotine dependence: Secondary | ICD-10-CM

## 2023-01-09 DIAGNOSIS — Z7901 Long term (current) use of anticoagulants: Secondary | ICD-10-CM

## 2023-01-09 DIAGNOSIS — Z882 Allergy status to sulfonamides status: Secondary | ICD-10-CM

## 2023-01-09 DIAGNOSIS — S91002A Unspecified open wound, left ankle, initial encounter: Secondary | ICD-10-CM | POA: Diagnosis present

## 2023-01-09 DIAGNOSIS — Z833 Family history of diabetes mellitus: Secondary | ICD-10-CM

## 2023-01-09 DIAGNOSIS — E782 Mixed hyperlipidemia: Secondary | ICD-10-CM | POA: Diagnosis present

## 2023-01-09 DIAGNOSIS — I495 Sick sinus syndrome: Secondary | ICD-10-CM | POA: Diagnosis present

## 2023-01-09 DIAGNOSIS — F0393 Unspecified dementia, unspecified severity, with mood disturbance: Secondary | ICD-10-CM | POA: Diagnosis present

## 2023-01-09 DIAGNOSIS — M47816 Spondylosis without myelopathy or radiculopathy, lumbar region: Secondary | ICD-10-CM | POA: Diagnosis present

## 2023-01-09 DIAGNOSIS — Z888 Allergy status to other drugs, medicaments and biological substances status: Secondary | ICD-10-CM

## 2023-01-09 DIAGNOSIS — D631 Anemia in chronic kidney disease: Secondary | ICD-10-CM | POA: Diagnosis present

## 2023-01-09 DIAGNOSIS — Z818 Family history of other mental and behavioral disorders: Secondary | ICD-10-CM

## 2023-01-09 DIAGNOSIS — Z96651 Presence of right artificial knee joint: Secondary | ICD-10-CM | POA: Diagnosis present

## 2023-01-09 DIAGNOSIS — Z950 Presence of cardiac pacemaker: Secondary | ICD-10-CM | POA: Diagnosis not present

## 2023-01-09 DIAGNOSIS — M545 Low back pain, unspecified: Secondary | ICD-10-CM | POA: Diagnosis present

## 2023-01-09 DIAGNOSIS — N183 Chronic kidney disease, stage 3 unspecified: Secondary | ICD-10-CM

## 2023-01-09 DIAGNOSIS — N1831 Chronic kidney disease, stage 3a: Secondary | ICD-10-CM | POA: Diagnosis present

## 2023-01-09 LAB — DIFFERENTIAL
Abs Immature Granulocytes: 0.02 10*3/uL (ref 0.00–0.07)
Basophils Absolute: 0.1 10*3/uL (ref 0.0–0.1)
Basophils Relative: 1 %
Eosinophils Absolute: 0.2 10*3/uL (ref 0.0–0.5)
Eosinophils Relative: 3 %
Immature Granulocytes: 0 %
Lymphocytes Relative: 14 %
Lymphs Abs: 1 10*3/uL (ref 0.7–4.0)
Monocytes Absolute: 0.8 10*3/uL (ref 0.1–1.0)
Monocytes Relative: 11 %
Neutro Abs: 5 10*3/uL (ref 1.7–7.7)
Neutrophils Relative %: 71 %

## 2023-01-09 LAB — COMPREHENSIVE METABOLIC PANEL
ALT: 18 U/L (ref 0–44)
ALT: 18 U/L (ref 0–44)
AST: 46 U/L — ABNORMAL HIGH (ref 15–41)
AST: 31 U/L (ref 15–41)
Albumin: 3.9 g/dL (ref 3.5–5.0)
Albumin: 3.6 g/dL (ref 3.5–5.0)
Alkaline Phosphatase: 23 U/L — ABNORMAL LOW (ref 38–126)
Alkaline Phosphatase: 21 U/L — ABNORMAL LOW (ref 38–126)
Anion gap: 15 (ref 5–15)
Anion gap: 8 (ref 5–15)
BUN: 52 mg/dL — ABNORMAL HIGH (ref 8–23)
BUN: 46 mg/dL — ABNORMAL HIGH (ref 8–23)
CO2: 26 mmol/L (ref 22–32)
CO2: 30 mmol/L (ref 22–32)
Calcium: 8.9 mg/dL (ref 8.9–10.3)
Calcium: 8.8 mg/dL — ABNORMAL LOW (ref 8.9–10.3)
Chloride: 100 mmol/L (ref 98–111)
Chloride: 100 mmol/L (ref 98–111)
Creatinine, Ser: 2.68 mg/dL — ABNORMAL HIGH (ref 0.44–1.00)
Creatinine, Ser: 2.53 mg/dL — ABNORMAL HIGH (ref 0.44–1.00)
GFR, Estimated: 18 mL/min — ABNORMAL LOW (ref >60)
GFR, Estimated: 20 mL/min — ABNORMAL LOW (ref >60)
Glucose, Bld: 93 mg/dL (ref 70–99)
Glucose, Bld: 137 mg/dL — ABNORMAL HIGH (ref 70–99)
Potassium: 6.1 mmol/L — ABNORMAL HIGH (ref 3.5–5.1)
Potassium: 4.1 mmol/L (ref 3.5–5.1)
Sodium: 141 mmol/L (ref 135–145)
Sodium: 138 mmol/L (ref 135–145)
Total Bilirubin: 0.7 mg/dL (ref 0.3–1.2)
Total Bilirubin: 0.8 mg/dL (ref 0.3–1.2)
Total Protein: 6.6 g/dL (ref 6.5–8.1)
Total Protein: 6.7 g/dL (ref 6.5–8.1)

## 2023-01-09 LAB — URINALYSIS, W/ REFLEX TO CULTURE (INFECTION SUSPECTED)
Bilirubin Urine: NEGATIVE
Glucose, UA: 50 mg/dL — AB
Hgb urine dipstick: NEGATIVE
Ketones, ur: NEGATIVE mg/dL
Leukocytes,Ua: NEGATIVE
Nitrite: NEGATIVE
Protein, ur: NEGATIVE mg/dL
Specific Gravity, Urine: 1.011 (ref 1.005–1.030)
pH: 6 (ref 5.0–8.0)

## 2023-01-09 LAB — CBC
HCT: 34.4 % — ABNORMAL LOW (ref 36.0–46.0)
Hemoglobin: 10.7 g/dL — ABNORMAL LOW (ref 12.0–15.0)
MCH: 31.7 pg (ref 26.0–34.0)
MCHC: 31.1 g/dL (ref 30.0–36.0)
MCV: 101.8 fL — ABNORMAL HIGH (ref 80.0–100.0)
Platelets: 217 10*3/uL (ref 150–400)
RBC: 3.38 MIL/uL — ABNORMAL LOW (ref 3.87–5.11)
RDW: 14 % (ref 11.5–15.5)
WBC: 7 10*3/uL (ref 4.0–10.5)
nRBC: 0 % (ref 0.0–0.2)

## 2023-01-09 LAB — RAPID URINE DRUG SCREEN, HOSP PERFORMED
Amphetamines: NOT DETECTED
Barbiturates: NOT DETECTED
Benzodiazepines: NOT DETECTED
Cocaine: NOT DETECTED
Opiates: POSITIVE — AB
Tetrahydrocannabinol: NOT DETECTED

## 2023-01-09 LAB — I-STAT CHEM 8, ED
BUN: 48 mg/dL — ABNORMAL HIGH (ref 8–23)
Calcium, Ion: 1.08 mmol/L — ABNORMAL LOW (ref 1.15–1.40)
Chloride: 103 mmol/L (ref 98–111)
Creatinine, Ser: 2.9 mg/dL — ABNORMAL HIGH (ref 0.44–1.00)
Glucose, Bld: 96 mg/dL (ref 70–99)
HCT: 35 % — ABNORMAL LOW (ref 36.0–46.0)
Hemoglobin: 11.9 g/dL — ABNORMAL LOW (ref 12.0–15.0)
Potassium: 4.7 mmol/L (ref 3.5–5.1)
Sodium: 139 mmol/L (ref 135–145)
TCO2: 28 mmol/L (ref 22–32)

## 2023-01-09 LAB — PROTIME-INR
INR: 2 — ABNORMAL HIGH (ref 0.8–1.2)
Prothrombin Time: 23 seconds — ABNORMAL HIGH (ref 11.4–15.2)

## 2023-01-09 LAB — GLUCOSE, CAPILLARY
Glucose-Capillary: 134 mg/dL — ABNORMAL HIGH (ref 70–99)
Glucose-Capillary: 133 mg/dL — ABNORMAL HIGH (ref 70–99)

## 2023-01-09 LAB — ETHANOL: Alcohol, Ethyl (B): <10 mg/dL (ref <10)

## 2023-01-09 LAB — APTT: aPTT: 41 seconds — ABNORMAL HIGH (ref 24–36)

## 2023-01-09 MED ORDER — AMIODARONE HCL 200 MG PO TABS
200.0000 mg | ORAL_TABLET | Freq: Every day | ORAL | Status: DC
  Administered 2023-01-10 – 2023-01-13 (×4): 200 mg via ORAL
  Filled 2023-01-09 (×4): qty 1

## 2023-01-09 MED ORDER — LEVOTHYROXINE SODIUM 50 MCG PO TABS
50.0000 ug | ORAL_TABLET | Freq: Every day | ORAL | Status: DC
  Administered 2023-01-10 – 2023-01-13 (×4): 50 ug via ORAL
  Filled 2023-01-09 (×4): qty 1

## 2023-01-09 MED ORDER — INSULIN ASPART 100 UNIT/ML IJ SOLN
0.0000 [IU] | Freq: Three times a day (TID) | INTRAMUSCULAR | Status: DC
  Administered 2023-01-10: 2 [IU] via SUBCUTANEOUS
  Administered 2023-01-11: 1 [IU] via SUBCUTANEOUS

## 2023-01-09 MED ORDER — TRAZODONE HCL 50 MG PO TABS
150.0000 mg | ORAL_TABLET | Freq: Every day | ORAL | Status: DC
  Administered 2023-01-09 – 2023-01-12 (×4): 150 mg via ORAL
  Filled 2023-01-09 (×4): qty 1

## 2023-01-09 MED ORDER — WARFARIN SODIUM 2 MG PO TABS
2.0000 mg | ORAL_TABLET | Freq: Once | ORAL | Status: AC
  Administered 2023-01-09: 2 mg via ORAL
  Filled 2023-01-09 (×2): qty 1

## 2023-01-09 MED ORDER — SODIUM CHLORIDE 0.9 % IV SOLN: INTRAVENOUS | Status: DC

## 2023-01-09 MED ORDER — LAMOTRIGINE 100 MG PO TABS
200.0000 mg | ORAL_TABLET | Freq: Every day | ORAL | Status: DC
  Administered 2023-01-09 – 2023-01-12 (×4): 200 mg via ORAL
  Filled 2023-01-09 (×4): qty 2

## 2023-01-09 MED ORDER — DAPAGLIFLOZIN PROPANEDIOL 10 MG PO TABS
10.0000 mg | ORAL_TABLET | Freq: Every day | ORAL | Status: DC
  Administered 2023-01-10 – 2023-01-13 (×4): 10 mg via ORAL
  Filled 2023-01-09 (×4): qty 1

## 2023-01-09 MED ORDER — SODIUM CHLORIDE 0.9% FLUSH
3.0000 mL | Freq: Two times a day (BID) | INTRAVENOUS | Status: DC
  Administered 2023-01-09 – 2023-01-13 (×8): 3 mL via INTRAVENOUS

## 2023-01-09 MED ORDER — ROSUVASTATIN CALCIUM 20 MG PO TABS
40.0000 mg | ORAL_TABLET | Freq: Every day | ORAL | Status: DC
  Administered 2023-01-10 – 2023-01-13 (×4): 40 mg via ORAL
  Filled 2023-01-09 (×4): qty 2

## 2023-01-09 MED ORDER — LAMOTRIGINE 100 MG PO TABS
100.0000 mg | ORAL_TABLET | Freq: Every day | ORAL | Status: DC
  Administered 2023-01-10 – 2023-01-13 (×4): 100 mg via ORAL
  Filled 2023-01-09 (×4): qty 1

## 2023-01-09 MED ORDER — MELATONIN 5 MG PO TABS
10.0000 mg | ORAL_TABLET | Freq: Every day | ORAL | Status: DC
  Administered 2023-01-09 – 2023-01-12 (×4): 10 mg via ORAL
  Filled 2023-01-09 (×4): qty 2

## 2023-01-09 MED ORDER — ACETAMINOPHEN 325 MG PO TABS
650.0000 mg | ORAL_TABLET | Freq: Four times a day (QID) | ORAL | Status: DC | PRN
  Administered 2023-01-10 – 2023-01-11 (×2): 650 mg via ORAL
  Filled 2023-01-09: qty 2

## 2023-01-09 MED ORDER — WARFARIN - PHARMACIST DOSING INPATIENT: Freq: Every day | Status: DC

## 2023-01-09 MED ORDER — ACETAMINOPHEN 650 MG RE SUPP: 650.0000 mg | Freq: Four times a day (QID) | RECTAL | Status: DC | PRN

## 2023-01-09 MED ORDER — SODIUM CHLORIDE 0.9 % IV BOLUS
500.0000 mL | Freq: Once | INTRAVENOUS | Status: AC
  Administered 2023-01-09: 500 mL via INTRAVENOUS

## 2023-01-09 MED ORDER — POLYETHYLENE GLYCOL 3350 17 G PO PACK: 17.0000 g | PACK | Freq: Every day | ORAL | Status: DC | PRN

## 2023-01-09 MED ORDER — FENOFIBRATE 160 MG PO TABS
160.0000 mg | ORAL_TABLET | Freq: Every day | ORAL | Status: DC
  Administered 2023-01-10 – 2023-01-13 (×4): 160 mg via ORAL
  Filled 2023-01-09 (×4): qty 1

## 2023-01-09 MED ORDER — CARVEDILOL 6.25 MG PO TABS
6.2500 mg | ORAL_TABLET | Freq: Two times a day (BID) | ORAL | Status: DC
  Administered 2023-01-10 – 2023-01-13 (×7): 6.25 mg via ORAL
  Filled 2023-01-09 (×7): qty 1

## 2023-01-09 MED ORDER — PANTOPRAZOLE SODIUM 20 MG PO TBEC
20.0000 mg | DELAYED_RELEASE_TABLET | ORAL | Status: DC
  Administered 2023-01-10 – 2023-01-12 (×2): 20 mg via ORAL
  Filled 2023-01-09 (×2): qty 1

## 2023-01-09 NOTE — H&P (Addendum)
History and Physical   [name] HGD:[id number] DOB: 11-28-1950 DOA: 01/09/2023  PCP: Karna Dupes, MD   Patient coming from: [facility] re[facility]  Chief Complaint: Weakness  HPI: [name] is a 72 y.o. female with medical history significant of CHF, diabetes, CKD 3A, hypothyroidism, hyperlipidemia, CVA, GERD, hypertension, atrial fibrillation, anemia, subdural hematoma, aortic valve replacement, QT prolongation, status post pacemaker, bipolar disorder, anxiety, obesity, low back pain presenting with weakness.  Patient was noted to have increasing weakness at her facility over the last day.  Now requiring increasing assistance for activities including getting out of bed.  Staff noted the weakness seem to be greater on the right than the left.  Patient denies fevers, chills, chest pain, shortness of breath, abdominal pain, constipation, diarrhea, nausea, vomiting.  ED Course: Vital signs in the ED stable.  Lab workup included CMP with potassium 6.1, BUN 52, creatinine elevated 2.68 from baseline of 1.7, AST 46.  CBC with hemoglobin 10.7.  PT 23, INR 2, PTT 41.  UDS pending.  Ethanol level negative.  Patient received 500 cc IV fluid in the ED.  Review of Systems: As per HPI otherwise all other systems reviewed and are negative.  Past Medical History:  Diagnosis Date   Acute cystitis without hematuria 07/24/2020   Acute hypoxemic respiratory failure (HCC) 06/27/2021   Acute on chronic combined systolic and diastolic CHF (congestive heart failure) (HCC) 07/24/2020   Altered mental status 04/29/2020   ANEMIA 05/28/2010   Ankle pain 02/04/2018   AORTIC VALVE REPLACEMENT, HX OF 03/11/2010   Mechanical prosthesis   Ascending aortic aneurysm (HCC)    Atrial fibrillation (HCC)    Atrial flutter (HCC)    BIPOLAR AFFECTIVE DISORDER 03/11/2010   Bradycardia    Cellulitis and abscess of left leg 11/18/2010   Chronic diastolic CHF (congestive heart failure) (HCC)    Chronic kidney  disease    Constipation 03/11/2010   Qualifier: Diagnosis of   By: Abner Greenspan MD, Stacey         Depression    Diabetes [facility])    Diverticulitis 12/18/2010   Elevated TSH 04/30/2020   Fall at home, initial encounter 07/24/2020   Falls    FIBROIDS, UTERUS 03/11/2010   Gout 09/04/2016   Grave's disease 12/2010   Hyperlipidemia associated with type 2 diabetes mellitus (HCC) 06/05/2016   Hypertension    Hyperthyroidism 12/18/2010   In the context of amiodarone, resolved off it     Low back pain 11/02/2017   Migraine 06/05/2016   Mixed hyperlipidemia 03/11/2010   Near syncope 08/21/2019   Obesity    Pacemaker    Prolonged QT interval    SAH (subarachnoid hemorrhage) (HCC)    Skin cancer    Subarachnoid hematoma (HCC) 07/24/2020   Subdural hematoma (HCC)    Supratherapeutic INR 04/30/2020   Syncope 08/22/2019   UTI (lower urinary tract infection) 08/14/2011   UTI (urinary tract infection) 02/10/2013    Past Surgical History:  Procedure Laterality Date   ABDOMINAL HYSTERECTOMY  ?1998   sb/l spo, total for fibroids/heavy bleeding   ANKLE SURGERY Left 01/2018   hardware removal   AORTIC VALVE REPLACEMENT  2011   Mechanical prosthesis, [employer]   APPENDECTOMY     Required revision for EColi infection, required recurrent packing   bowel obstruction     Requiring adhesions to be removed   CARDIOVERSION N/A 05/09/2020   Procedure: CARDIOVERSION;  Surgeon: Chilton Si, MD;  Location: [facility] ENDOSCOPY;  Service:  Cardiovascular;  Laterality: N/A;   CARDIOVERSION N/A 09/23/2021   Procedure: CARDIOVERSION;  Surgeon: Laurey Morale, MD;  Location: [facility] ENDOSCOPY;  Service: Cardiovascular;  Laterality: N/A;   CHOLECYSTECTOMY     COLONOSCOPY WITH PROPOFOL N/A 11/01/2015   Procedure: COLONOSCOPY WITH PROPOFOL;  Surgeon: Rachael Fee, MD;  Location: [facility] ENDOSCOPY;  Service: Endoscopy;  Laterality: N/A;   CYSTOCELE REPAIR N/A 10/16/2015   Procedure: ANTERIOR REPAIR (CYSTOCELE);  Surgeon: Reva Bores, MD;  Location: [facility] ORS;  Service: Gynecology;  Laterality: N/A;   HERNIA REPAIR  08-16-10   LEFT HEART CATHETERIZATION WITH CORONARY ANGIOGRAM N/A 12/05/2011   Procedure: LEFT HEART CATHETERIZATION WITH CORONARY ANGIOGRAM;  Surgeon: Lesleigh Noe, MD;  Location: [facility] CATH LAB;  Service: Cardiovascular;  Laterality: N/A;   PACEMAKER IMPLANT N/A 01/09/2020   Procedure: PACEMAKER IMPLANT;  Surgeon: Marinus Maw, MD;  Location: [facility] INVASIVE CV LAB;  Service: Cardiovascular;  Laterality: N/A;   RIGHT HEART CATH AND CORONARY ANGIOGRAPHY N/A 07/05/2021   Procedure: RIGHT HEART CATH AND CORONARY ANGIOGRAPHY;  Surgeon: Tonny Bollman, MD;  Location: [facility] INVASIVE CV LAB;  Service: Cardiovascular;  Laterality: N/A;   TEE WITHOUT CARDIOVERSION N/A 09/23/2021   Procedure: TRANSESOPHAGEAL ECHOCARDIOGRAM (TEE);  Surgeon: Laurey Morale, MD;  Location: [facility] ENDOSCOPY;  Service: Cardiovascular;  Laterality: N/A;   TOTAL KNEE ARTHROPLASTY Right 02/24/2013   Procedure: RIGHT TOTAL KNEE ARTHROPLASTY;  Surgeon: Javier Docker, MD;  Location: [facility] ORS;  Service: Orthopedics;  Laterality: Right;   TUBAL LIGATION     varicose vein surgery b/l legs      Social History  reports that she quit smoking about 32 years ago. Her smoking use included cigarettes. She has never used smokeless tobacco. She reports current alcohol use. She reports that she does not use drugs.  Allergies  Allergen Reactions   Mucinex [Guaifenesin Er] Other (See Comments)    Severe headaches    Keflex [Cephalexin] Other (See Comments)    Headaches and dizziness   Keflex [Cephalexin] Other (See Comments)    Headaches and/or dizziness- "allergic," per MAR   Mucinex [Guaifenesin Er] Other (See Comments)    Severe headaches- "allergic," per MAR   Sulfa Antibiotics Other (See Comments)    headaches   Sulfa Antibiotics Other (See Comments)    Headaches- "allergic," per MAR    Sulfonamide Derivatives Other (See Comments)    Headaches      Family History  Problem Relation Age of Onset   Scoliosis Mother    Arthritis Mother        Rheumatoid   Aneurysm Mother        heart   Alcohol abuse Father    Depression Sister    Depression Brother    Alcohol abuse Brother    Cancer Maternal Grandmother        colon   Diabetes Maternal Grandfather    Heart disease Maternal Grandfather    Alcohol abuse Maternal Grandfather    Depression Paternal Grandmother    Diabetes Paternal Grandmother    Depression Paternal Grandfather    Diabetes Paternal Grandfather    Depression Sister    Alcohol abuse Brother    Depression Brother    Heart disease Brother   Reviewed on admission  Prior to Admission medications   Medication Sig Start Date End Date Taking? Authorizing Provider  albuterol (VENTOLIN HFA) 108 (90 Base) MCG/ACT inhaler Inhale 2 puffs into the lungs at bedtime. 06/05/21   [provider]  amiodarone (PACERONE) 200 MG tablet Take 200 mg by mouth daily.    [provider]  carvedilol (COREG) 6.25 MG tablet Take 6.25 mg by mouth 2 (two) times daily with a meal.    [provider]  cholecalciferol (VITAMIN D) 25 MCG tablet Take 1 tablet (1,000 Units total) by mouth daily. 07/08/21   Marguerita Merles Latif, DO  dapagliflozin propanediol (FARXIGA) 10 MG TABS tablet Take 1 tablet (10 mg total) by mouth daily. 07/08/21   Marguerita Merles Latif, DO  diclofenac Sodium (VOLTAREN) 1 % GEL Apply 2 g topically 2 (two) times daily. Apply to Knee    [provider]  fenofibrate 160 MG tablet Take 1 tablet (160 mg total) by mouth daily. 07/25/20   [place], Eric J, DO  fexofenadine (ALLEGRA) 60 MG tablet Take 60 mg by mouth daily.    [provider]  furosemide (LASIX) 20 MG tablet Take 3 tablets (60 mg total) by mouth daily. 11/21/22   Laurey Morale, MD  lamoTRIgine (LAMICTAL) 100 MG tablet Take 100 mg by mouth daily.    [provider]  lamoTRIgine (LAMICTAL) 200 MG tablet Take 200 mg by  mouth at bedtime. 12/13/20   [provider]  levalbuterol Pauline Aus) 0.63 MG/3ML nebulizer solution Take 0.63 mg by nebulization 2 (two) times daily.    [provider]  levothyroxine (SYNTHROID) 25 MCG tablet TAKE 1 TABLET BY MOUTH DAILY BEFORE BREAKFAST. 08/22/20   Bradd Canary, MD  lidocaine 4 % Place 1 patch onto the skin daily. Apply every morning  Off every night    [provider]  meclizine (ANTIVERT) 25 MG tablet Take 25 mg by mouth as needed for dizziness.    [provider]  melatonin 5 MG TABS Take 10 mg by mouth at bedtime.    [provider]  Menthol, Topical Analgesic, (BIOFREEZE PROFESSIONAL) 5 % GEL Apply 1 application. topically 2 (two) times daily. Apply thin layer to right knee for pain    [provider]  methocarbamol (ROBAXIN) 500 MG tablet Take 500 mg by mouth as needed for muscle spasms.    [provider]  pantoprazole (PROTONIX) 40 MG tablet Take 1 tablet (40 mg total) by mouth daily. 07/25/20   [place], Eric J, DO  potassium chloride SA (KLOR-CON M) 20 MEQ tablet Take 1 tablet (20 mEq total) by mouth daily. 07/08/21   Marguerita Merles Latif, DO  rosuvastatin (CRESTOR) 40 MG tablet Take 40 mg by mouth daily.    [provider]  sacubitril-valsartan (ENTRESTO) 49-51 MG Take 1 tablet by mouth 2 (two) times daily. 09/10/21   Laurey Morale, MD  spironolactone (ALDACTONE) 25 MG tablet Take 1 tablet (25 mg total) by mouth daily. 08/14/21   Laurey Morale, MD  traZODone (DESYREL) 150 MG tablet Take 150 mg by mouth at bedtime.    [provider]  venlafaxine XR (EFFEXOR XR) 75 MG 24 hr capsule Take 1 capsule (75 mg total) by mouth daily with breakfast. 10/11/19 11/21/23  Corie Chiquito, PMHNP  vitamin B-12 (CYANOCOBALAMIN) 1000 MCG tablet Take 1,000 mcg by mouth daily.    [provider]  warfarin (COUMADIN) 2 MG tablet Take 2 mg by mouth daily. 10/01/20   [provider]  methimazole  (TAPAZOLE) 5 MG tablet Take 1 tablet (5 mg total) by mouth 3 (three) times daily. 06/24/11 08/31/19  Romero Belling, MD    Physical Exam: Vitals:   01/09/23 1310 01/09/23 1345 01/09/23 1545  01/09/23 1703  BP: (!) 122/58 121/78    Pulse: 60 60 60 (!) 59  Resp: 18 14 17 20   Temp:      TempSrc:      SpO2: 99% 96% 100% 100%    Physical Exam Constitutional:      General: She is not in acute distress.    Appearance: Normal appearance.  HENT:     Head: Normocephalic and atraumatic.     Mouth/Throat:     Mouth: Mucous membranes are moist.     Pharynx: Oropharynx is clear.  Eyes:     Extraocular Movements: Extraocular movements intact.     Pupils: Pupils are equal, round, and reactive to light.  Cardiovascular:     Rate and Rhythm: Normal rate and regular rhythm.     Pulses: Normal pulses.     Heart sounds: Normal heart sounds.  Pulmonary:     Effort: Pulmonary effort is normal. No respiratory distress.     Breath sounds: Normal breath sounds.  Abdominal:     General: Bowel sounds are normal. There is no distension.     Palpations: Abdomen is soft.     Tenderness: There is no abdominal tenderness.  Musculoskeletal:        General: No swelling or deformity.  Skin:    General: Skin is warm and dry.  Neurological:     Comments: Mental Status: Patient is awake, alert, mild confusion No signs of aphasia or neglect Cranial Nerves: II: Pupils equal, round, and reactive to light.   III,IV, VI: EOMI, patient reporting some double vision and noted to have some lateral nystagmus. V: Facial sensation is symmetric to light touch and  Temperature. VII: Facial movement is symmetric.  VIII: hearing is intact to voice X: Uvula elevates symmetrically XI: Shoulder shrug is symmetric. XII: tongue is midline without atrophy or fasciculations.  Motor: Good effort thorughout, at Least 4/5 RUE, RLE.  At least 5/5 LUE, LLE. Sensory: Sensation is grossly intact bilateral UEs & LEs Cerebellar:  Finger-Nose intact bilalat    Labs on Admission: I have personally reviewed following labs and imaging studies  CBC: Recent Labs  Lab 01/09/23 1309 01/09/23 1339  WBC 7.0  --   NEUTROABS 5.0  --   HGB 10.7* 11.9*  HCT 34.4* 35.0*  MCV 101.8*  --   PLT 217  --     Basic Metabolic Panel: Recent Labs  Lab 01/09/23 1309 01/09/23 1339  NA 141 139  K 6.1* 4.7  CL 100 103  CO2 26  --   GLUCOSE 93 96  BUN 52* 48*  CREATININE 2.68* 2.90*  CALCIUM 8.9  --     GFR: CrCl cannot be calculated (Unknown ideal weight.).  Liver Function Tests: Recent Labs  Lab 01/09/23 1309  AST 46*  ALT 18  ALKPHOS 23*  BILITOT 0.7  PROT 6.6  ALBUMIN 3.9    Urine analysis:    Component Value Date/Time   COLORURINE YELLOW 07/24/2020 0108   APPEARANCEUR CLOUDY (A) 07/24/2020 0108   LABSPEC 1.008 07/24/2020 0108   PHURINE 5.0 07/24/2020 0108   GLUCOSEU NEGATIVE 07/24/2020 0108   GLUCOSEU NEGATIVE 11/02/2017 1428   HGBUR SMALL (A) 07/24/2020 0108   BILIRUBINUR NEGATIVE 07/24/2020 0108   BILIRUBINUR neg 04/26/2020 1619   KETONESUR NEGATIVE 07/24/2020 0108   PROTEINUR NEGATIVE 07/24/2020 0108   UROBILINOGEN 0.2 04/26/2020 1619   UROBILINOGEN 0.2 11/02/2017 1428   NITRITE NEGATIVE 07/24/2020 0108   LEUKOCYTESUR LARGE (A) 07/24/2020 0108  Radiological Exams on Admission: CT Head Wo Contrast  Result Date: 01/09/2023 CLINICAL DATA:  Neuro deficit, acute, stroke suspected EXAM: CT HEAD WITHOUT CONTRAST TECHNIQUE: Contiguous axial images were obtained from the base of the skull through the vertex without intravenous contrast. RADIATION DOSE REDUCTION: This exam was performed according to the departmental dose-optimization program which includes automated exposure control, adjustment of the mA and/or kV according to patient size and/or use of iterative reconstruction technique. COMPARISON:  CT head 06/13/2020. FINDINGS: Brain: No convincing evidence of acute hemorrhage. No evidence of  acute large vascular territory infarction, hydrocephalus, extra-axial collection or mass lesion/mass effect. Mild dural thickening along the left cerebral convexity with areas of mineralization, favored postsurgical given overlying craniotomy. Vascular: No hyperdense vessel.  Calcific atherosclerosis. Skull: No acute fracture.  Left craniotomy. Sinuses/Orbits: Clear sinuses per neck overall findings. IMPRESSION: 1. No evidence of acute intracranial abnormality. 2. Mild dural thickening along the left cerebral convexity with areas of mineralization, favored postsurgical given overlying craniotomy. Electronically Signed   By: Feliberto Harts M.D.   On: 01/09/2023 15:17    EKG: Independently reviewed.  Junctional versus sinus rhythm at 60 bpm.  QTc significantly prolonged at 596.  Assessment/Plan Principal Problem:   Acute renal failure superimposed on stage 3a chronic kidney disease (HCC) Active Problems:   Essential hypertension   Bipolar disorder (HCC)   Hypothyroidism   GERD without esophagitis   Hx of mechanical aortic valve replacement   Long term current use of anticoagulant therapy   Atrial fibrillation (HCC)   Hyperlipidemia associated with type 2 diabetes mellitus (HCC)   Type 2 diabetes mellitus with diabetic polyneuropathy, without long-term current use of insulin (HCC)   Prolonged QT interval   Tachycardia-bradycardia syndrome (HCC)   Pacemaker   History of CVA (cerebrovascular accident)   Anemia of chronic kidney failure, stage 3 (moderate) (HCC)   Obesity (BMI 30-39.9)   AKI on CKD 3 Weakness Hyperkalemia > Patient presenting from rehab facility with weakness.  Staff thought it was greater on the right than left.  CT head negative.  Exam concerning for focal neurologic deficits with some blurry vision, lateral nystagmus, right upper extremity and right lower extremity weakness.  Will obtain MRI to rule out CVA. > Found to have AKI with creatinine elevated to 2.68 from  baseline 1.7.  Potassium read as elevated at 6.1, i-STAT showed normal potassium so this is being repeated again. > Received 500 cc IV fluid in the setting of significant CHF as below. - Monitor on telemetry overnight - MRI brain, consult neurology and pursue stroke workup if positive. (This may take a while to confirm appropriate measures for pacemaker, likely compatible given it was implanted in 2021.) - Follow-up repeat potassium, Lokelma if elevated - Continue with gentle IV fluids overnight - Hold diuretics and Entresto - Trend renal function and electrolytes - PT/OT eval and treat  Chronic systolic CHF > Last echo in 2023 showed EF 35-40%, global hypokinesis, mildly reduced RV function - Holding Lasix, spironolactone, Entresto as above - Continue home carvedilol, Farxiga - Strict I's and O's, daily weights - Receiving gentle IV fluids as above  Atrial fibrillation - Continue home amiodarone, warfarin - Continue home carvedilol  Diabetes - SSI  Hypothyroidism - Continue home Synthroid  Hyperlipidemia History of CVA - Continue home warfarin, rosuvastatin, fenofibrate  GERD - Continue PPI  Hypertension - Holding home Lasix, spironolactone, Entresto as above - Continue home Coreg  Anemia > Hemoglobin stable at 10.7 in ED -  Trend CBC  Status post mechanical aortic valve replacement - Noted - Warfarin per pharmacy  QTc prolongation > Remains prolonged at 5 x 6 in ED. - Avoid QTc prolonging medications were able - Daily EKG  Bipolar disorder Anxiety - Continue home venlafaxine, trazodone, Lamictal  History of tachybradycardia syndrome - Status post pacemaker  Obesity - Noted  DVT prophylaxis: Warfarin Code Status:   DNR/DNI Family Communication:  Updated at bedside Disposition Plan:   Patient is from:  [facility]  Anticipated DC to:  Same as above  Anticipated DC date:  1 to 3 days  Anticipated DC barriers: None  Consults called:   None Admission status:  Observation, telemetry  Severity of Illness: The appropriate patient status for this patient is OBSERVATION. Observation status is judged to be reasonable and necessary in order to provide the required intensity of service to ensure the patient's safety. The patient's presenting symptoms, physical exam findings, and initial radiographic and laboratory data in the context of their medical condition is felt to place them at decreased risk for further clinical deterioration. Furthermore, it is anticipated that the patient will be medically stable for discharge from the hospital within 2 midnights of admission.    Synetta Fail MD [facility]  How to contact the [facility] Attending or Consulting provider 7A - 7P or covering provider during after hours 7P -7A, for this patient?   Check the care team in [facility] and look for a) attending/consulting [facility] provider listed and b) the [facility] team listed Log into [email or web address] and use [facility] universal password to access. If you do not have the password, please contact the hospital operator. Locate the [facility] provider you are looking for under [facility] and page to a number that you can be directly reached. If you still have difficulty reaching the provider, please page the [facility] (Director on Call) for the Hospitalists listed on amion for assistance.  01/09/2023, 5:13 PM

## 2023-01-09 NOTE — ED Notes (Signed)
Pt attempted to use bedpan, was unsuccessful. Will try again later.

## 2023-01-09 NOTE — ED Provider Notes (Signed)
[place] EMERGENCY DEPARTMENT AT [facility] Provider Note   CSN: [id number] Arrival date & time: 01/09/23  1259     History  Chief Complaint  Patient presents with   Weakness    [name] is a 72 y.o. female with PMHx HLD, HTN, DM, Bipolar affective disorder, Afib on Warfarin, CHF, CKD,  who presents to ED from [facility] concerned for generalized weakness greater on the right side since 8PM last night. Patient stating that she has needed increased assistance to bathroom d/t weakness. Also endorsing right eye discomfort that is 1/10 severity.   Patient compliant with blood thinners.  Denies any infectious symptoms such as fever, chest pain, dyspnea, cough, nausea, vomiting, diarrhea, dysuria, hematuria, hematochezia. Denies recent falls/head trauma.  s   Weakness      Home Medications Prior to Admission medications   Medication Sig Start Date End Date Taking? Authorizing Provider  albuterol (VENTOLIN HFA) 108 (90 Base) MCG/ACT inhaler Inhale 2 puffs into the lungs at bedtime. 06/05/21   [provider]  amiodarone (PACERONE) 200 MG tablet Take 200 mg by mouth daily.    [provider]  carvedilol (COREG) 6.25 MG tablet Take 6.25 mg by mouth 2 (two) times daily with a meal.    [provider]  cholecalciferol (VITAMIN D) 25 MCG tablet Take 1 tablet (1,000 Units total) by mouth daily. 07/08/21   Marguerita Merles Latif, DO  dapagliflozin propanediol (FARXIGA) 10 MG TABS tablet Take 1 tablet (10 mg total) by mouth daily. 07/08/21   Marguerita Merles Latif, DO  diclofenac Sodium (VOLTAREN) 1 % GEL Apply 2 g topically 2 (two) times daily. Apply to Knee    [provider]  fenofibrate 160 MG tablet Take 1 tablet (160 mg total) by mouth daily. 07/25/20   [place], Eric J, DO  fexofenadine (ALLEGRA) 60 MG tablet Take 60 mg by mouth daily.    [provider]  furosemide (LASIX) 20 MG tablet Take 3 tablets (60 mg total)  by mouth daily. 11/21/22   Laurey Morale, MD  lamoTRIgine (LAMICTAL) 100 MG tablet Take 100 mg by mouth daily.    [provider]  lamoTRIgine (LAMICTAL) 200 MG tablet Take 200 mg by mouth at bedtime. 12/13/20   [provider]  levalbuterol Pauline Aus) 0.63 MG/3ML nebulizer solution Take 0.63 mg by nebulization 2 (two) times daily.    [provider]  levothyroxine (SYNTHROID) 25 MCG tablet TAKE 1 TABLET BY MOUTH DAILY BEFORE BREAKFAST. 08/22/20   Bradd Canary, MD  lidocaine 4 % Place 1 patch onto the skin daily. Apply every morning  Off every night    [provider]  meclizine (ANTIVERT) 25 MG tablet Take 25 mg by mouth as needed for dizziness.    [provider]  melatonin 5 MG TABS Take 10 mg by mouth at bedtime.    [provider]  Menthol, Topical Analgesic, (BIOFREEZE PROFESSIONAL) 5 % GEL Apply 1 application. topically 2 (two) times daily. Apply thin layer to right knee for pain    [provider]  methocarbamol (ROBAXIN) 500 MG tablet Take 500 mg by mouth as needed for muscle spasms.    [provider]  pantoprazole (PROTONIX) 40 MG tablet Take 1 tablet (40 mg total) by mouth daily. 07/25/20   [place], Eric J, DO  potassium chloride SA (KLOR-CON M) 20 MEQ tablet Take 1 tablet (20 mEq total) by mouth daily. 07/08/21   Marguerita Merles  Latif, DO  rosuvastatin (CRESTOR) 40 MG tablet Take 40 mg by mouth daily.    [provider]  sacubitril-valsartan (ENTRESTO) 49-51 MG Take 1 tablet by mouth 2 (two) times daily. 09/10/21   Laurey Morale, MD  spironolactone (ALDACTONE) 25 MG tablet Take 1 tablet (25 mg total) by mouth daily. 08/14/21   Laurey Morale, MD  traZODone (DESYREL) 150 MG tablet Take 150 mg by mouth at bedtime.    [provider]  venlafaxine XR (EFFEXOR XR) 75 MG 24 hr capsule Take 1 capsule (75 mg total) by mouth daily with breakfast. 10/11/19 11/21/23  Corie Chiquito, PMHNP  vitamin B-12  (CYANOCOBALAMIN) 1000 MCG tablet Take 1,000 mcg by mouth daily.    [provider]  warfarin (COUMADIN) 2 MG tablet Take 2 mg by mouth daily. 10/01/20   [provider]  methimazole (TAPAZOLE) 5 MG tablet Take 1 tablet (5 mg total) by mouth 3 (three) times daily. 06/24/11 08/31/19  Romero Belling, MD      Allergies    Mucinex [guaifenesin er], Keflex [cephalexin], Keflex [cephalexin], Mucinex [guaifenesin er], Sulfa antibiotics, Sulfa antibiotics, and Sulfonamide derivatives    Review of Systems   Review of Systems  Neurological:  Positive for weakness.    Physical Exam Updated Vital Signs BP 121/78   Pulse (!) 59   Temp 97.6 F (36.4 C) (Oral)   Resp 20   SpO2 100%  Physical Exam Vitals and nursing note reviewed.  Constitutional:      General: She is not in acute distress.    Appearance: She is not ill-appearing or toxic-appearing.  HENT:     Head: Normocephalic and atraumatic.     Mouth/Throat:     Mouth: Mucous membranes are moist.  Eyes:     General: No scleral icterus.       Right eye: No discharge.        Left eye: No discharge.     Conjunctiva/sclera: Conjunctivae normal.  Cardiovascular:     Rate and Rhythm: Normal rate and regular rhythm.     Pulses: Normal pulses.     Heart sounds: Normal heart sounds. No murmur heard.    Comments: No JVD Pulmonary:     Effort: Pulmonary effort is normal. No respiratory distress.     Breath sounds: Normal breath sounds. No wheezing, rhonchi or rales.  Abdominal:     General: Abdomen is flat. Bowel sounds are normal.     Palpations: Abdomen is soft. There is no mass.     Tenderness: There is no abdominal tenderness.  Musculoskeletal:     Right lower leg: No edema.     Left lower leg: No edema.  Skin:    General: Skin is warm and dry.     Findings: No rash.  Neurological:     General: No focal deficit present.     Mental Status: She is alert. Mental status is at baseline.     Comments: GCS 15. Speech is goal  oriented. No deficits appreciated to CN III-XII; symmetric eyebrow raise, no facial drooping, tongue midline. Patient has equal grip strength bilaterally with 4/5 strength against resistance in all major muscle groups bilaterally. Sensation to light touch intact. Patient moves extremities without ataxia.    Psychiatric:        Mood and Affect: Mood normal.     ED Results / Procedures / Treatments   Labs (all labs ordered are listed, but only abnormal results are displayed) Labs Reviewed  PROTIME-INR - Abnormal; Notable for the following components:      Result Value   Prothrombin Time 23.0 (*)    INR 2.0 (*)    All other components within normal limits  APTT - Abnormal; Notable for the following components:   aPTT 41 (*)    All other components within normal limits  CBC - Abnormal; Notable for the following components:   RBC 3.38 (*)    Hemoglobin 10.7 (*)    HCT 34.4 (*)    MCV 101.8 (*)    All other components within normal limits  COMPREHENSIVE METABOLIC PANEL - Abnormal; Notable for the following components:   Potassium 6.1 (*)    BUN 52 (*)    Creatinine, Ser 2.68 (*)    AST 46 (*)    Alkaline Phosphatase 23 (*)    GFR, Estimated 18 (*)    All other components within normal limits  I-STAT CHEM 8, ED - Abnormal; Notable for the following components:   BUN 48 (*)    Creatinine, Ser 2.90 (*)    Calcium, Ion 1.08 (*)    Hemoglobin 11.9 (*)    HCT 35.0 (*)    All other components within normal limits  ETHANOL  DIFFERENTIAL  RAPID URINE DRUG SCREEN, HOSP PERFORMED  URINALYSIS, W/ REFLEX TO CULTURE (INFECTION SUSPECTED)  COMPREHENSIVE METABOLIC PANEL  CBC  COMPREHENSIVE METABOLIC PANEL    EKG None  Radiology CT Head Wo Contrast  Result Date: 01/09/2023 CLINICAL DATA:  Neuro deficit, acute, stroke suspected EXAM: CT HEAD WITHOUT CONTRAST TECHNIQUE: Contiguous axial images were obtained from the base of the skull through the vertex without intravenous contrast.  RADIATION DOSE REDUCTION: This exam was performed according to the departmental dose-optimization program which includes automated exposure control, adjustment of the mA and/or kV according to patient size and/or use of iterative reconstruction technique. COMPARISON:  CT head 06/13/2020. FINDINGS: Brain: No convincing evidence of acute hemorrhage. No evidence of acute large vascular territory infarction, hydrocephalus, extra-axial collection or mass lesion/mass effect. Mild dural thickening along the left cerebral convexity with areas of mineralization, favored postsurgical given overlying craniotomy. Vascular: No hyperdense vessel.  Calcific atherosclerosis. Skull: No acute fracture.  Left craniotomy. Sinuses/Orbits: Clear sinuses per neck overall findings. IMPRESSION: 1. No evidence of acute intracranial abnormality. 2. Mild dural thickening along the left cerebral convexity with areas of mineralization, favored postsurgical given overlying craniotomy. Electronically Signed   By: Feliberto Harts M.D.   On: 01/09/2023 15:17    Procedures Procedures    Medications Ordered in ED Medications  carvedilol (COREG) tablet 6.25 mg (has no administration in time range)  rosuvastatin (CRESTOR) tablet 40 mg (has no administration in time range)  fenofibrate tablet 160 mg (has no administration in time range)  amiodarone (PACERONE) tablet 200 mg (has no administration in time range)  traZODone (DESYREL) tablet 150 mg (has no administration in time range)  dapagliflozin propanediol (FARXIGA) tablet 10 mg (has no administration in time range)  levothyroxine (SYNTHROID) tablet 25 mcg (has no administration in time range)  pantoprazole (PROTONIX) EC tablet 40 mg (has no administration in time range)  melatonin tablet 10 mg (has no administration in time range)  lamoTRIgine (LAMICTAL) tablet 100 mg (has no administration in time range)  lamoTRIgine (LAMICTAL) tablet 200 mg (has no administration in time range)   sodium chloride flush (NS) 0.9 % injection 3 mL (has no administration in time range)  0.9 %  sodium chloride infusion (has no administration in time  range)  acetaminophen (TYLENOL) tablet 650 mg (has no administration in time range)    Or  acetaminophen (TYLENOL) suppository 650 mg (has no administration in time range)  polyethylene glycol (MIRALAX / GLYCOLAX) packet 17 g (has no administration in time range)  sodium chloride 0.9 % bolus 500 mL (0 mLs Intravenous Stopped 01/09/23 1603)    ED Course/ Medical Decision Making/ A&P                                 Medical Decision Making Amount and/or Complexity of Data Reviewed Labs: ordered. Radiology: ordered.   This patient presents to the ED for concern of weakness, this involves an extensive number of treatment options, and is a complaint that carries with it a high risk of complications and morbidity.  The differential diagnosis includes Ischemic stroke, intracerebral hemorrhage, subarachnoid hemorrhage, Guillain-Barr syndrome, hypoglycemia, electrolyte abnormality, sepsis, ACS, carbon monoxide poisoning, anemia, dehydration.   Co morbidities that complicate the patient evaluation  HLD, HTN, DM, Bipolar affective disorder, Afib on Warfarin, CHF, CKD   Lab Tests:  I Ordered, and personally interpreted labs.  The pertinent results include:   -CBC: anemia (10.7); no leukocytosis -CMP: hyperkalemia (6.1); BUN/Cr elevated past baseline at 52/2.68 - APTT: 41 - PT-INR: 23 and 2.0 - UA: pending    Imaging Studies ordered:  I ordered imaging studies including  -CT head: To assess for process contributing patient's symptoms I independently visualized and interpreted imaging  I agree with the radiologist interpretation   Cardiac Monitoring: / EKG:  The patient was maintained on a cardiac monitor.  I personally viewed and interpreted the cardiac monitored which showed an underlying rhythm of: No ST changes or  arrhythmias   Consultations Obtained:  I requested consultation with the Hospitalist Dr. Alinda Money,  and discussed lab and imaging findings as well as pertinent plan - they agree to admit patient   Problem List / ED Course / Critical interventions / Medication management  Admitting patient for AKI and weakness PMHx HLD, HTN, DM, Bipolar affective disorder, Afib on Warfarin, CHF, CKD Patient presents to ED concerned for diffuse weakness (greater on right side per patient) since 8PM last night. Denying any infectious symptoms such as fever, cough, dyspnea, vomiting, diarrhea, dysuria.  Physical and neuro exam unremarkable. Patient afebrile with stable vitals. CMP showing BUN/Cr (52/2.68). UA pending. CMP also showing potassium at 6.1 - chem 8 showing potassium at 4.7 so I am obtaining a repeat CMP. CBC without leukocytosis, anemia at 10.7. CBG 95. APTT/PT-INR elevated (patient on warfarin) Obtained CT d/t weakness being greater on right side and hx of strokes - negative. EKG without acute changes. Given AKI, started patient on IV fluids. Patient has hx of CHF but physical exam not concerning for fluid overload.  Dr. Alinda Money admitting provider. I have reviewed the patients home medicines and have made adjustments as needed   DDX: these are considered less likely due to history of present illness and physical exam findings -Ischemic stroke/ICH/SAH: no neurodeficits -Hypoglycemia/electrolyte abnormality: CMP/BMP without concern -sepsis: no fever; vital signs stable -ACS: EKG sinus rhythm and no acute ST changes -Guillain-Barr syndrome: no tingling/paresthesias in extremities, no neurodeficits -carbon monoxide poisoning: no nausea/vomiting; AMS; vision changes; dyspnea   Social Determinants of Health:  none          Final Clinical Impression(s) / ED Diagnoses Final diagnoses:  AKI (acute kidney injury) (HCC)  Weakness  Rx / DC Orders ED Discharge Orders     None          Dorthy Cooler, [place] 01/09/23 1714    Anders Simmonds T, DO 01/10/23 1737

## 2023-01-09 NOTE — Progress Notes (Signed)
ANTICOAGULATION CONSULT NOTE - Initial Consult  Pharmacy Consult for warfarin Indication: atrial fibrillation and mechanical aortic valve  Allergies  Allergen Reactions   Mucinex [Guaifenesin Er] Other (See Comments)    Severe headaches    Keflex [Cephalexin] Other (See Comments)    Headaches Dizziness    Mucinex [Guaifenesin Er] Other (See Comments)    Severe headaches   Sulfa Antibiotics Other (See Comments)    Headaches    Sulfonamide Derivatives Other (See Comments)    Headaches     Patient Measurements:    Vital Signs: Temp: 97.6 F (36.4 C) (08/16 1819) Temp Source: Oral (08/16 1309) BP: 114/90 (08/16 1815) Pulse Rate: 60 (08/16 1815)  Labs: Recent Labs    01/09/23 1309 01/09/23 1339  HGB 10.7* 11.9*  HCT 34.4* 35.0*  PLT 217  --   APTT 41*  --   LABPROT 23.0*  --   INR 2.0*  --   CREATININE 2.68* 2.90*    CrCl cannot be calculated (Unknown ideal weight.).   Medical History: Past Medical History:  Diagnosis Date   Acute cystitis without hematuria 07/24/2020   Acute hypoxemic respiratory failure (HCC) 06/27/2021   Acute on chronic combined systolic and diastolic CHF (congestive heart failure) (HCC) 07/24/2020   Altered mental status 04/29/2020   ANEMIA 05/28/2010   Ankle pain 02/04/2018   AORTIC VALVE REPLACEMENT, HX OF 03/11/2010   Mechanical prosthesis   Ascending aortic aneurysm (HCC)    Atrial fibrillation (HCC)    Atrial flutter (HCC)    BIPOLAR AFFECTIVE DISORDER 03/11/2010   Bradycardia    Cellulitis and abscess of left leg 11/18/2010   Chronic diastolic CHF (congestive heart failure) (HCC)    Chronic kidney disease    Constipation 03/11/2010   Qualifier: Diagnosis of   By: Abner Greenspan MD, Stacey         Depression    Diabetes (HCC)    Diverticulitis 12/18/2010   Elevated TSH 04/30/2020   Fall at home, initial encounter 07/24/2020   Falls    FIBROIDS, UTERUS 03/11/2010   Gout 09/04/2016   Grave's disease 12/2010   Hyperlipidemia  associated with type 2 diabetes mellitus (HCC) 06/05/2016   Hypertension    Hyperthyroidism 12/18/2010   In the context of amiodarone, resolved off it     Low back pain 11/02/2017   Migraine 06/05/2016   Mixed hyperlipidemia 03/11/2010   Near syncope 08/21/2019   Obesity    Pacemaker    Prolonged QT interval    SAH (subarachnoid hemorrhage) (HCC)    Skin cancer    Subarachnoid hematoma (HCC) 07/24/2020   Subdural hematoma (HCC)    Supratherapeutic INR 04/30/2020   Syncope 08/22/2019   UTI (lower urinary tract infection) 08/14/2011   UTI (urinary tract infection) 02/10/2013    Medications:  Scheduled:   [START ON 01/10/2023] amiodarone  200 mg Oral Daily   carvedilol  6.25 mg Oral BID WC   [START ON 01/10/2023] dapagliflozin propanediol  10 mg Oral Daily   [START ON 01/10/2023] fenofibrate  160 mg Oral Daily   [START ON 01/10/2023] insulin aspart  0-9 Units Subcutaneous TID WC   [START ON 01/10/2023] lamoTRIgine  100 mg Oral Daily   lamoTRIgine  200 mg Oral QHS   [START ON 01/10/2023] levothyroxine  25 mcg Oral QAC breakfast   melatonin  10 mg Oral QHS   [START ON 01/10/2023] pantoprazole  40 mg Oral Daily   [START ON 01/10/2023] rosuvastatin  40 mg Oral Daily  sodium chloride flush  3 mL Intravenous Q12H   traZODone  150 mg Oral QHS   Infusions:   sodium chloride      Assessment: 72 yo female on warfarin PTA. Was taking 2 mg daily, though INR returned 4.0 on Monday. Warfarin was on hold from Monday-Friday for elevated INR and was to be restarted on 2 mg T/Th/Sat and 1.5 mg on all other days. INR is barely therapeutic today at 2.0, hgb 11.9, plt wnl.  Goal of Therapy:  INR 2-3 Monitor platelets by anticoagulation protocol: Yes   Plan:  Give warfarin 2 mg x 1 tonight  Monitor CBC, INR, and s/sx of bleeding   Dreya Buhrman I Carine Nordgren 01/09/2023,6:34 PM

## 2023-01-09 NOTE — ED Notes (Signed)
Pt attempted bedpan again but was unable to urinate. Notified RN.

## 2023-01-09 NOTE — ED Triage Notes (Addendum)
Pt BIB [facility] with c/o right sided weakness that started this morning when she woke up this morning at 0500 LKN was 2000 last night at bed time.  Intermittent double vision and pain in her right eye. Pt also reports that she is having a hard time thinking of her words every once in awhile.   BP 111/74 Hr 74 CBG 95 100% room air

## 2023-01-09 NOTE — ED Notes (Signed)
ED TO INPATIENT HANDOFF REPORT  ED Nurse Name and Phone #: Britt Boozer [phone]  S Name/Age/Gender [name] 72 y.o. female Room/Bed: 009C/009C  Code Status   Code Status: Full Code  Home/SNF/Other Skilled nursing facility Patient oriented to: self, place, time, and situation Is this baseline? Yes   Triage Complete: Triage complete  Chief Complaint AKI (acute kidney injury) (HCC) [N17.9]  Triage Note Pt BIB [facility] with c/o right sided weakness that started this morning when she woke up this morning at 0500 LKN was 2000 last night at bed time.  Intermittent double vision and pain in her right eye. Pt also reports that she is having a hard time thinking of her words every once in awhile.   BP 111/74 Hr 74 CBG 95 100% room air     Allergies Allergies  Allergen Reactions   Mucinex [Guaifenesin Er] Other (See Comments)    Severe headaches    Keflex [Cephalexin] Other (See Comments)    Headaches and dizziness   Keflex [Cephalexin] Other (See Comments)    Headaches and/or dizziness- "allergic," per MAR   Mucinex [Guaifenesin Er] Other (See Comments)    Severe headaches- "allergic," per MAR   Sulfa Antibiotics Other (See Comments)    headaches   Sulfa Antibiotics Other (See Comments)    Headaches- "allergic," per MAR    Sulfonamide Derivatives Other (See Comments)    Headaches     Level of Care/Admitting Diagnosis ED Disposition     ED Disposition  Admit   Condition  --   Comment  Hospital Area: [facility] [[id number]]  Level of Care: Telemetry Medical [104]  May place patient in observation at [facility] or [facility] if equivalent level of care is available:: No  Covid Evaluation: Asymptomatic - no recent exposure (last 10 days) testing not required  Diagnosis: AKI (acute kidney injury) [facility]) [[id number]]  Admitting Physician: Synetta Fail [[id number]]  Attending Physician: Synetta Fail [[id number]]          B Medical/Surgery  History Past Medical History:  Diagnosis Date   Acute cystitis without hematuria 07/24/2020   Acute hypoxemic respiratory failure (HCC) 06/27/2021   Acute on chronic combined systolic and diastolic CHF (congestive heart failure) (HCC) 07/24/2020   Altered mental status 04/29/2020   ANEMIA 05/28/2010   Ankle pain 02/04/2018   AORTIC VALVE REPLACEMENT, HX OF 03/11/2010   Mechanical prosthesis   Ascending aortic aneurysm (HCC)    Atrial fibrillation (HCC)    Atrial flutter (HCC)    BIPOLAR AFFECTIVE DISORDER 03/11/2010   Bradycardia    Cellulitis and abscess of left leg 11/18/2010   Chronic diastolic CHF (congestive heart failure) (HCC)    Chronic kidney disease    Constipation 03/11/2010   Qualifier: Diagnosis of   By: Abner Greenspan MD, Stacey         Depression    Diabetes [facility])    Diverticulitis 12/18/2010   Elevated TSH 04/30/2020   Fall at home, initial encounter 07/24/2020   Falls    FIBROIDS, UTERUS 03/11/2010   Gout 09/04/2016   Grave's disease 12/2010   Hyperlipidemia associated with type 2 diabetes mellitus (HCC) 06/05/2016   Hypertension    Hyperthyroidism 12/18/2010   In the context of amiodarone, resolved off it     Low back pain 11/02/2017   Migraine 06/05/2016   Mixed hyperlipidemia 03/11/2010   Near syncope 08/21/2019   Obesity    Pacemaker    Prolonged QT interval  SAH (subarachnoid hemorrhage) (HCC)    Skin cancer    Subarachnoid hematoma (HCC) 07/24/2020   Subdural hematoma (HCC)    Supratherapeutic INR 04/30/2020   Syncope 08/22/2019   UTI (lower urinary tract infection) 08/14/2011   UTI (urinary tract infection) 02/10/2013   Past Surgical History:  Procedure Laterality Date   ABDOMINAL HYSTERECTOMY  ?1998   sb/l spo, total for fibroids/heavy bleeding   ANKLE SURGERY Left 01/2018   hardware removal   AORTIC VALVE REPLACEMENT  2011   Mechanical prosthesis, [employer]   APPENDECTOMY     Required revision for EColi infection, required recurrent packing    bowel obstruction     Requiring adhesions to be removed   CARDIOVERSION N/A 05/09/2020   Procedure: CARDIOVERSION;  Surgeon: Chilton Si, MD;  Location: [facility] ENDOSCOPY;  Service: Cardiovascular;  Laterality: N/A;   CARDIOVERSION N/A 09/23/2021   Procedure: CARDIOVERSION;  Surgeon: Laurey Morale, MD;  Location: [facility] ENDOSCOPY;  Service: Cardiovascular;  Laterality: N/A;   CHOLECYSTECTOMY     COLONOSCOPY WITH PROPOFOL N/A 11/01/2015   Procedure: COLONOSCOPY WITH PROPOFOL;  Surgeon: Rachael Fee, MD;  Location: [facility] ENDOSCOPY;  Service: Endoscopy;  Laterality: N/A;   CYSTOCELE REPAIR N/A 10/16/2015   Procedure: ANTERIOR REPAIR (CYSTOCELE);  Surgeon: Reva Bores, MD;  Location: [facility] ORS;  Service: Gynecology;  Laterality: N/A;   HERNIA REPAIR  08-16-10   LEFT HEART CATHETERIZATION WITH CORONARY ANGIOGRAM N/A 12/05/2011   Procedure: LEFT HEART CATHETERIZATION WITH CORONARY ANGIOGRAM;  Surgeon: Lesleigh Noe, MD;  Location: [facility] CATH LAB;  Service: Cardiovascular;  Laterality: N/A;   PACEMAKER IMPLANT N/A 01/09/2020   Procedure: PACEMAKER IMPLANT;  Surgeon: Marinus Maw, MD;  Location: [facility] INVASIVE CV LAB;  Service: Cardiovascular;  Laterality: N/A;   RIGHT HEART CATH AND CORONARY ANGIOGRAPHY N/A 07/05/2021   Procedure: RIGHT HEART CATH AND CORONARY ANGIOGRAPHY;  Surgeon: Tonny Bollman, MD;  Location: [facility] INVASIVE CV LAB;  Service: Cardiovascular;  Laterality: N/A;   TEE WITHOUT CARDIOVERSION N/A 09/23/2021   Procedure: TRANSESOPHAGEAL ECHOCARDIOGRAM (TEE);  Surgeon: Laurey Morale, MD;  Location: [facility] ENDOSCOPY;  Service: Cardiovascular;  Laterality: N/A;   TOTAL KNEE ARTHROPLASTY Right 02/24/2013   Procedure: RIGHT TOTAL KNEE ARTHROPLASTY;  Surgeon: Javier Docker, MD;  Location: [facility] ORS;  Service: Orthopedics;  Laterality: Right;   TUBAL LIGATION     varicose vein surgery b/l legs       A IV Location/Drains/Wounds Patient Lines/Drains/Airways Status     Active Line/Drains/Airways      Name Placement date Placement time Site Days   Peripheral IV 01/09/23 20 G Left;Posterior Hand 01/09/23  1332  Hand  less than 1            Intake/Output Last 24 hours  Intake/Output Summary (Last 24 hours) at 01/09/2023 1714 Last data filed at 01/09/2023 1603 Gross per 24 hour  Intake 500 ml  Output --  Net 500 ml    Labs/Imaging Results for orders placed or performed during the hospital encounter of 01/09/23 (from the past 48 hour(s))  Protime-INR     Status: Abnormal   Collection Time: 01/09/23  1:09 PM  Result Value Ref Range   Prothrombin Time 23.0 (H) 11.4 - 15.2 seconds   INR 2.0 (H) 0.8 - 1.2    Comment: (NOTE) INR goal varies based on device and disease states. Performed at [facility], [address] [place], [place] [address]   APTT     Status:  Abnormal   Collection Time: 01/09/23  1:09 PM  Result Value Ref Range   aPTT 41 (H) 24 - 36 seconds    Comment:        IF BASELINE aPTT IS ELEVATED, SUGGEST PATIENT RISK ASSESSMENT BE USED TO DETERMINE APPROPRIATE ANTICOAGULANT THERAPY. Performed at [facility], [address], [place], [place] [address]   CBC     Status: Abnormal   Collection Time: 01/09/23  1:09 PM  Result Value Ref Range   WBC 7.0 4.0 - 10.5 K/uL   RBC 3.38 (L) 3.87 - 5.11 MIL/uL   Hemoglobin 10.7 (L) 12.0 - 15.0 g/dL   HCT 60.4 (L) 54.0 - 98.1 %   MCV 101.8 (H) 80.0 - 100.0 fL   MCH 31.7 26.0 - 34.0 pg   MCHC 31.1 30.0 - 36.0 g/dL   RDW 19.1 47.8 - 29.5 %   Platelets 217 150 - 400 K/uL   nRBC 0.0 0.0 - 0.2 %    Comment: Performed at [facility], [address] [place], [place] [address]  Differential     Status: None   Collection Time: 01/09/23  1:09 PM  Result Value Ref Range   Neutrophils Relative % 71 %   Neutro Abs 5.0 1.7 - 7.7 K/uL   Lymphocytes Relative 14 %   Lymphs Abs 1.0 0.7 - 4.0 K/uL   Monocytes Relative 11 %   Monocytes Absolute 0.8 0.1 - 1.0 K/uL   Eosinophils Relative 3 %   Eosinophils  Absolute 0.2 0.0 - 0.5 K/uL   Basophils Relative 1 %   Basophils Absolute 0.1 0.0 - 0.1 K/uL   Immature Granulocytes 0 %   Abs Immature Granulocytes 0.02 0.00 - 0.07 K/uL    Comment: Performed at [facility], [address] [place], [place] [address]  Comprehensive metabolic panel     Status: Abnormal   Collection Time: 01/09/23  1:09 PM  Result Value Ref Range   Sodium 141 135 - 145 mmol/L   Potassium 6.1 (H) 3.5 - 5.1 mmol/L    Comment: HEMOLYSIS AT THIS LEVEL MAY AFFECT RESULT   Chloride 100 98 - 111 mmol/L   CO2 26 22 - 32 mmol/L   Glucose, Bld 93 70 - 99 mg/dL    Comment: Glucose reference range applies only to samples taken after fasting for at least 8 hours.   BUN 52 (H) 8 - 23 mg/dL   Creatinine, Ser 4.69 (H) 0.44 - 1.00 mg/dL   Calcium 8.9 8.9 - 62.9 mg/dL   Total Protein 6.6 6.5 - 8.1 g/dL   Albumin 3.9 3.5 - 5.0 g/dL   AST 46 (H) 15 - 41 U/L    Comment: HEMOLYSIS AT THIS LEVEL MAY AFFECT RESULT   ALT 18 0 - 44 U/L    Comment: HEMOLYSIS AT THIS LEVEL MAY AFFECT RESULT   Alkaline Phosphatase 23 (L) 38 - 126 U/L   Total Bilirubin 0.7 0.3 - 1.2 mg/dL    Comment: HEMOLYSIS AT THIS LEVEL MAY AFFECT RESULT   GFR, Estimated 18 (L) >60 mL/min    Comment: (NOTE) Calculated using the CKD-EPI Creatinine Equation (2021)    Anion gap 15 5 - 15    Comment: Performed at [facility], [address], [place], [place] [address]  Ethanol     Status: None   Collection Time: 01/09/23  1:30 PM  Result Value Ref Range   Alcohol, Ethyl (B) <10 <10 mg/dL  Comment: (NOTE) Lowest detectable limit for serum alcohol is 10 mg/dL.  For medical purposes only. Performed at [facility], [address], [place], [place] [address]   I-stat chem 8, ED     Status: Abnormal   Collection Time: 01/09/23  1:39 PM  Result Value Ref Range   Sodium 139 135 - 145 mmol/L   Potassium 4.7 3.5 - 5.1 mmol/L   Chloride 103 98 - 111 mmol/L   BUN 48 (H) 8 - 23 mg/dL   Creatinine, Ser  6.21 (H) 0.44 - 1.00 mg/dL   Glucose, Bld 96 70 - 99 mg/dL    Comment: Glucose reference range applies only to samples taken after fasting for at least 8 hours.   Calcium, Ion 1.08 (L) 1.15 - 1.40 mmol/L   TCO2 28 22 - 32 mmol/L   Hemoglobin 11.9 (L) 12.0 - 15.0 g/dL   HCT 30.8 (L) 65.7 - 84.6 %   *Note: Due to a large number of results and/or encounters for the requested time period, some results have not been displayed. A complete set of results can be found in Results Review.   CT Head Wo Contrast  Result Date: 01/09/2023 CLINICAL DATA:  Neuro deficit, acute, stroke suspected EXAM: CT HEAD WITHOUT CONTRAST TECHNIQUE: Contiguous axial images were obtained from the base of the skull through the vertex without intravenous contrast. RADIATION DOSE REDUCTION: This exam was performed according to the departmental dose-optimization program which includes automated exposure control, adjustment of the mA and/or kV according to patient size and/or use of iterative reconstruction technique. COMPARISON:  CT head 06/13/2020. FINDINGS: Brain: No convincing evidence of acute hemorrhage. No evidence of acute large vascular territory infarction, hydrocephalus, extra-axial collection or mass lesion/mass effect. Mild dural thickening along the left cerebral convexity with areas of mineralization, favored postsurgical given overlying craniotomy. Vascular: No hyperdense vessel.  Calcific atherosclerosis. Skull: No acute fracture.  Left craniotomy. Sinuses/Orbits: Clear sinuses per neck overall findings. IMPRESSION: 1. No evidence of acute intracranial abnormality. 2. Mild dural thickening along the left cerebral convexity with areas of mineralization, favored postsurgical given overlying craniotomy. Electronically Signed   By: Feliberto Harts M.D.   On: 01/09/2023 15:17    Pending Labs Unresulted Labs (From admission, onward)     Start     Ordered   01/10/23 0500  Comprehensive metabolic panel  Tomorrow morning,    R        01/09/23 1703   01/10/23 0500  CBC  Tomorrow morning,   R        01/09/23 1703   01/09/23 1704  Comprehensive metabolic panel  ONCE - STAT,   STAT        01/09/23 1703   01/09/23 1427  Urinalysis, w/ Reflex to Culture (Infection Suspected) -Urine, Clean Catch  Once,   URGENT       Question:  Specimen Source  Answer:  Urine, Clean Catch   01/09/23 1426   01/09/23 1309  Urine rapid drug screen (hosp performed)  Once,   STAT        01/09/23 1308            Vitals/Pain Today's Vitals   01/09/23 1345 01/09/23 1545 01/09/23 1700 01/09/23 1703  BP: 121/78     Pulse: 60 60 60 (!) 59  Resp: 14 17 14 20   Temp:      TempSrc:      SpO2: 96% 100% 100% 100%  PainSc:  Isolation Precautions No active isolations  Medications Medications  carvedilol (COREG) tablet 6.25 mg (has no administration in time range)  rosuvastatin (CRESTOR) tablet 40 mg (has no administration in time range)  fenofibrate tablet 160 mg (has no administration in time range)  amiodarone (PACERONE) tablet 200 mg (has no administration in time range)  traZODone (DESYREL) tablet 150 mg (has no administration in time range)  dapagliflozin propanediol (FARXIGA) tablet 10 mg (has no administration in time range)  levothyroxine (SYNTHROID) tablet 25 mcg (has no administration in time range)  pantoprazole (PROTONIX) EC tablet 40 mg (has no administration in time range)  melatonin tablet 10 mg (has no administration in time range)  lamoTRIgine (LAMICTAL) tablet 100 mg (has no administration in time range)  lamoTRIgine (LAMICTAL) tablet 200 mg (has no administration in time range)  sodium chloride flush (NS) 0.9 % injection 3 mL (has no administration in time range)  0.9 %  sodium chloride infusion (has no administration in time range)  acetaminophen (TYLENOL) tablet 650 mg (has no administration in time range)    Or  acetaminophen (TYLENOL) suppository 650 mg (has no administration in time range)   polyethylene glycol (MIRALAX / GLYCOLAX) packet 17 g (has no administration in time range)  sodium chloride 0.9 % bolus 500 mL (0 mLs Intravenous Stopped 01/09/23 1603)    Mobility manual wheelchair      R Recommendations: See Admitting Provider Note  Report given to:   Additional Notes:

## 2023-01-10 ENCOUNTER — Observation Stay (HOSPITAL_COMMUNITY): Payer: PPO

## 2023-01-10 DIAGNOSIS — I480 Paroxysmal atrial fibrillation: Secondary | ICD-10-CM | POA: Diagnosis not present

## 2023-01-10 DIAGNOSIS — Z66 Do not resuscitate: Secondary | ICD-10-CM | POA: Diagnosis not present

## 2023-01-10 DIAGNOSIS — M7752 Other enthesopathy of left foot: Secondary | ICD-10-CM | POA: Diagnosis not present

## 2023-01-10 DIAGNOSIS — G8929 Other chronic pain: Secondary | ICD-10-CM | POA: Diagnosis not present

## 2023-01-10 DIAGNOSIS — I130 Hypertensive heart and chronic kidney disease with heart failure and stage 1 through stage 4 chronic kidney disease, or unspecified chronic kidney disease: Secondary | ICD-10-CM | POA: Diagnosis not present

## 2023-01-10 DIAGNOSIS — I5042 Chronic combined systolic (congestive) and diastolic (congestive) heart failure: Secondary | ICD-10-CM | POA: Diagnosis not present

## 2023-01-10 DIAGNOSIS — I7121 Aneurysm of the ascending aorta, without rupture: Secondary | ICD-10-CM | POA: Diagnosis not present

## 2023-01-10 DIAGNOSIS — I6782 Cerebral ischemia: Secondary | ICD-10-CM | POA: Diagnosis not present

## 2023-01-10 DIAGNOSIS — R2689 Other abnormalities of gait and mobility: Secondary | ICD-10-CM | POA: Diagnosis not present

## 2023-01-10 DIAGNOSIS — E1142 Type 2 diabetes mellitus with diabetic polyneuropathy: Secondary | ICD-10-CM | POA: Diagnosis not present

## 2023-01-10 DIAGNOSIS — F0393 Unspecified dementia, unspecified severity, with mood disturbance: Secondary | ICD-10-CM | POA: Diagnosis not present

## 2023-01-10 DIAGNOSIS — R29818 Other symptoms and signs involving the nervous system: Secondary | ICD-10-CM | POA: Diagnosis not present

## 2023-01-10 DIAGNOSIS — E860 Dehydration: Secondary | ICD-10-CM | POA: Diagnosis not present

## 2023-01-10 DIAGNOSIS — N179 Acute kidney failure, unspecified: Secondary | ICD-10-CM | POA: Diagnosis not present

## 2023-01-10 DIAGNOSIS — F0394 Unspecified dementia, unspecified severity, with anxiety: Secondary | ICD-10-CM | POA: Diagnosis not present

## 2023-01-10 DIAGNOSIS — Z952 Presence of prosthetic heart valve: Secondary | ICD-10-CM | POA: Diagnosis not present

## 2023-01-10 DIAGNOSIS — N1831 Chronic kidney disease, stage 3a: Secondary | ICD-10-CM | POA: Diagnosis not present

## 2023-01-10 DIAGNOSIS — R41841 Cognitive communication deficit: Secondary | ICD-10-CM | POA: Diagnosis not present

## 2023-01-10 DIAGNOSIS — D631 Anemia in chronic kidney disease: Secondary | ICD-10-CM | POA: Diagnosis not present

## 2023-01-10 DIAGNOSIS — E782 Mixed hyperlipidemia: Secondary | ICD-10-CM | POA: Diagnosis not present

## 2023-01-10 DIAGNOSIS — R531 Weakness: Secondary | ICD-10-CM | POA: Diagnosis not present

## 2023-01-10 DIAGNOSIS — E1122 Type 2 diabetes mellitus with diabetic chronic kidney disease: Secondary | ICD-10-CM | POA: Diagnosis not present

## 2023-01-10 DIAGNOSIS — I4819 Other persistent atrial fibrillation: Secondary | ICD-10-CM | POA: Diagnosis not present

## 2023-01-10 DIAGNOSIS — E1169 Type 2 diabetes mellitus with other specified complication: Secondary | ICD-10-CM | POA: Diagnosis not present

## 2023-01-10 DIAGNOSIS — R2681 Unsteadiness on feet: Secondary | ICD-10-CM | POA: Diagnosis not present

## 2023-01-10 DIAGNOSIS — M545 Low back pain, unspecified: Secondary | ICD-10-CM | POA: Diagnosis not present

## 2023-01-10 DIAGNOSIS — I69398 Other sequelae of cerebral infarction: Secondary | ICD-10-CM | POA: Diagnosis not present

## 2023-01-10 DIAGNOSIS — R1312 Dysphagia, oropharyngeal phase: Secondary | ICD-10-CM | POA: Diagnosis not present

## 2023-01-10 DIAGNOSIS — E875 Hyperkalemia: Secondary | ICD-10-CM | POA: Diagnosis not present

## 2023-01-10 DIAGNOSIS — M7989 Other specified soft tissue disorders: Secondary | ICD-10-CM | POA: Diagnosis not present

## 2023-01-10 DIAGNOSIS — S91002A Unspecified open wound, left ankle, initial encounter: Secondary | ICD-10-CM | POA: Diagnosis not present

## 2023-01-10 DIAGNOSIS — I495 Sick sinus syndrome: Secondary | ICD-10-CM | POA: Diagnosis not present

## 2023-01-10 DIAGNOSIS — E669 Obesity, unspecified: Secondary | ICD-10-CM | POA: Diagnosis not present

## 2023-01-10 DIAGNOSIS — M6281 Muscle weakness (generalized): Secondary | ICD-10-CM | POA: Diagnosis not present

## 2023-01-10 DIAGNOSIS — R262 Difficulty in walking, not elsewhere classified: Secondary | ICD-10-CM | POA: Diagnosis not present

## 2023-01-10 DIAGNOSIS — K219 Gastro-esophageal reflux disease without esophagitis: Secondary | ICD-10-CM | POA: Diagnosis not present

## 2023-01-10 DIAGNOSIS — F319 Bipolar disorder, unspecified: Secondary | ICD-10-CM | POA: Diagnosis not present

## 2023-01-10 DIAGNOSIS — E039 Hypothyroidism, unspecified: Secondary | ICD-10-CM | POA: Diagnosis not present

## 2023-01-10 LAB — CBC
HCT: 36.9 % (ref 36.0–46.0)
Hemoglobin: 11.4 g/dL — ABNORMAL LOW (ref 12.0–15.0)
MCH: 31.3 pg (ref 26.0–34.0)
MCHC: 30.9 g/dL (ref 30.0–36.0)
MCV: 101.4 fL — ABNORMAL HIGH (ref 80.0–100.0)
Platelets: 221 10*3/uL (ref 150–400)
RBC: 3.64 MIL/uL — ABNORMAL LOW (ref 3.87–5.11)
RDW: 14 % (ref 11.5–15.5)
WBC: 8.4 10*3/uL (ref 4.0–10.5)
nRBC: 0 % (ref 0.0–0.2)

## 2023-01-10 LAB — COMPREHENSIVE METABOLIC PANEL
ALT: 19 U/L (ref 0–44)
AST: 32 U/L (ref 15–41)
Albumin: 3.8 g/dL (ref 3.5–5.0)
Alkaline Phosphatase: 26 U/L — ABNORMAL LOW (ref 38–126)
Anion gap: 9 (ref 5–15)
BUN: 43 mg/dL — ABNORMAL HIGH (ref 8–23)
CO2: 29 mmol/L (ref 22–32)
Calcium: 9.1 mg/dL (ref 8.9–10.3)
Chloride: 102 mmol/L (ref 98–111)
Creatinine, Ser: 2.35 mg/dL — ABNORMAL HIGH (ref 0.44–1.00)
GFR, Estimated: 21 mL/min — ABNORMAL LOW (ref >60)
Glucose, Bld: 113 mg/dL — ABNORMAL HIGH (ref 70–99)
Potassium: 4.2 mmol/L (ref 3.5–5.1)
Sodium: 140 mmol/L (ref 135–145)
Total Bilirubin: 0.6 mg/dL (ref 0.3–1.2)
Total Protein: 6.7 g/dL (ref 6.5–8.1)

## 2023-01-10 LAB — GLUCOSE, CAPILLARY
Glucose-Capillary: 103 mg/dL — ABNORMAL HIGH (ref 70–99)
Glucose-Capillary: 108 mg/dL — ABNORMAL HIGH (ref 70–99)
Glucose-Capillary: 171 mg/dL — ABNORMAL HIGH (ref 70–99)
Glucose-Capillary: 95 mg/dL (ref 70–99)
Glucose-Capillary: 114 mg/dL — ABNORMAL HIGH (ref 70–99)

## 2023-01-10 LAB — PROTIME-INR
INR: 2 — ABNORMAL HIGH (ref 0.8–1.2)
Prothrombin Time: 22.6 s — ABNORMAL HIGH (ref 11.4–15.2)

## 2023-01-10 LAB — T4, FREE: Free T4: 1.4 ng/dL — ABNORMAL HIGH (ref 0.61–1.12)

## 2023-01-10 LAB — TSH: TSH: 1.313 u[IU]/mL (ref 0.350–4.500)

## 2023-01-10 LAB — HEMOGLOBIN A1C
Hgb A1c MFr Bld: 5.2 % (ref 4.8–5.6)
Mean Plasma Glucose: 102.54 mg/dL

## 2023-01-10 MED ORDER — ENOXAPARIN SODIUM 100 MG/ML IJ SOSY
100.0000 mg | PREFILLED_SYRINGE | INTRAMUSCULAR | Status: DC
  Administered 2023-01-10: 100 mg via SUBCUTANEOUS
  Filled 2023-01-10 (×2): qty 1

## 2023-01-10 MED ORDER — WARFARIN SODIUM 3 MG PO TABS
3.0000 mg | ORAL_TABLET | Freq: Once | ORAL | Status: AC
  Administered 2023-01-10: 3 mg via ORAL
  Filled 2023-01-10: qty 1

## 2023-01-10 MED ORDER — FLUTICASONE PROPIONATE 50 MCG/ACT NA SUSP
1.0000 | Freq: Two times a day (BID) | NASAL | Status: DC
  Administered 2023-01-10 – 2023-01-13 (×6): 1 via NASAL
  Filled 2023-01-10: qty 16

## 2023-01-10 MED ORDER — WARFARIN SODIUM 2 MG PO TABS: 2.0000 mg | ORAL_TABLET | Freq: Once | ORAL | Status: DC

## 2023-01-10 MED ORDER — VENLAFAXINE HCL 75 MG PO TABS
75.0000 mg | ORAL_TABLET | Freq: Every day | ORAL | Status: DC
  Administered 2023-01-10 – 2023-01-13 (×4): 75 mg via ORAL
  Filled 2023-01-10 (×4): qty 1

## 2023-01-10 NOTE — Consult Note (Signed)
WOC Nurse Consult Note: Reason for Consult:Small chronic, nonhealing wound care to left lateral malleolus wound, full thickness.  Consult performed remotely after review of EMR including photograph and discussion with Dr. [place] and the bedside RNs via Secure Chat. Wound type:venous insufficiency Pressure Injury POA: N/A Measurement:To be measured by the bedside RN with performance of wound care today and documented on Nursing flow sheet Wound VWU:[id number] by the presence of biofilm (yellow) Drainage (amount, consistency, odor) serous to light yellow Periwound:mild erythema, no edema Dressing procedure/placement/frequency:I have provide nursing with guidance for daily care consisting of cleansing with Vashe pure hypochlorous acid [place] # [phone]) by way of a 10 minutes soak to the wound and surrounding skin followed by pat dry and placement of a size appropriate piece of silver hydrofiber (Aquacel Ag+ Advantage, [place] # [phone]) to the wound topped with a piece of dry gauze and secured with a silicone foam for atraumatic dressing changes. Daily care while in the hospital is appropriate and may be reduced in frequency to 3 times weekly upon discharge if desired. Heels are to be floated while in bed. A prophylactic foam is to be placed to the sacrum for PI prevention.  WOC nursing team will not follow, but will remain available to this patient, the nursing and medical teams.  Please re-consult if needed.  Thank you for inviting [place] to participate in this patient's Plan of Care.  Ladona Mow, MSN, RN, CNS, GNP, Leda Min, [employer], [employer] phone:  [phone]

## 2023-01-10 NOTE — Evaluation (Signed)
Occupational Therapy Evaluation Patient Details Name: [name] MRN: [id number] DOB: 08/28/1950 Today's Date: 01/10/2023   History of Present Illness 72 y.o. female presented to [facility] ED on 8/16 from [facility] after she was noted to have increasing weakness over the last day. PMHx: chronic combined systolic/diastolic congestive heart failure (EF 25-30% 2023), HTN, HLD, type 2 diabetes mellitus, hypothyroidism, CVA, Hx SDH with multiple falls s/p craniotomy, Hx ascending aortic aneurysm s/p resection/grafting and placement of Saint Jude mechanical AVR, tachy/bradycardia syndrome s/p PPM, persistent atrial fibrillation on Coumadin, history of QT prolongation, bipolar disorder, anxiety, chronic low back pain, debilitation, carotid artery disease, CKD stage IIIa, memory impairment/dementia, obesity.   Clinical Impression   Prior to this admission, patient was living at [facility], and could transfer to her w/c and in and out the shower and complete her ADLs until recently. Patient now states she requires assist for all transfers, assist to complete ADLs, and assist in and out of the shower (two people). Currently, patient presenting with increased confusion, double vision, and need for increased assist in order to complete ADLs and functional mobility. Patient with fleeting attention and STM deficits, as well as reporting no visual deficits then endorsing double vision less than ten minutes later. Patient also with R lateral lean (due to back pain per patient) and decreased activity tolerance. OT recommending rehab at [facility] if possible in order to promote prior level of independence. OT will continue to follow.       If plan is discharge home, recommend the following: A lot of help with walking and/or transfers;A lot of help with bathing/dressing/bathroom;Direct supervision/assist for medications management;Direct supervision/assist for financial management;Assist for  transportation;Help with stairs or ramp for entrance;Supervision due to cognitive status    Functional Status Assessment  Patient has had a recent decline in their functional status and demonstrates the ability to make significant improvements in function in a reasonable and predictable amount of time.  Equipment Recommendations  None recommended by OT (patient has DME needed)    Recommendations for Other Services       Precautions / Restrictions Precautions Precautions: Fall Restrictions Weight Bearing Restrictions: No      Mobility Bed Mobility Overal bed mobility: Needs Assistance             General bed mobility comments: Patient up in chair upon arrival    Transfers Overall transfer level: Needs assistance                 General transfer comment: Patient had just gotten up to the recliner with PT, politely declining transfers with OT      Balance Overall balance assessment: Needs assistance, History of Falls                                         ADL either performed or assessed with clinical judgement   ADL Overall ADL's : Needs assistance/impaired Eating/Feeding: Set up;Sitting   Grooming: Set up;Sitting   Upper Body Bathing: Minimal assistance;Sitting   Lower Body Bathing: Maximal assistance;Total assistance;Sit to/from stand;Sitting/lateral leans   Upper Body Dressing : Minimal assistance;Sitting   Lower Body Dressing: Maximal assistance;Total assistance;Sit to/from stand;Sitting/lateral leans   Toilet Transfer: Stand-pivot;Moderate assistance;BSC/3in1   Toileting- Clothing Manipulation and Hygiene: Maximal assistance;Sit to/from stand;Sitting/lateral lean       Functional mobility during ADLs: Moderate assistance;+2 for physical assistance;+2  for safety/equipment;Cueing for safety;Cueing for sequencing General ADL Comments: Patient presenting with increased confusion, double vision, and need for increased assist in  order to complete ADLs and functional mobility.     Vision Baseline Vision/History: 1 Wears glasses Ability to See in Adequate Light: 1 Impaired Patient Visual Report: Diplopia Vision Assessment?: Yes Eye Alignment: Within Functional Limits Ocular Range of Motion: Within Functional Limits Alignment/Gaze Preference: Head tilt Tracking/Visual Pursuits: Able to track stimulus in all quads without difficulty Saccades: Decreased speed of saccadic movement Convergence: Within functional limits Visual Fields: No apparent deficits Diplopia Assessment: Disappears with one eye closed;Objects split on top of one another;Present in far gaze Additional Comments: Patient initially stating that she isnt having issues with her vision, then stating that she is having double vision about 10 minutes later. Will continue to assess in functional contexts     Perception         Praxis         Pertinent Vitals/Pain Pain Assessment Pain Assessment: Faces Faces Pain Scale: Hurts even more Pain Location: back Pain Descriptors / Indicators: Aching, Constant Pain Intervention(s): Monitored during session, Repositioned     Extremity/Trunk Assessment Upper Extremity Assessment Upper Extremity Assessment: Generalized weakness   Lower Extremity Assessment Lower Extremity Assessment: Defer to PT evaluation   Cervical / Trunk Assessment Cervical / Trunk Assessment: Kyphotic   Communication Communication Communication: No apparent difficulties Cueing Techniques: Verbal cues   Cognition Arousal: Alert Behavior During Therapy: WFL for tasks assessed/performed Overall Cognitive Status: Impaired/Different from baseline Area of Impairment: Orientation, Attention, Memory, Following commands, Safety/judgement, Awareness, Problem solving                 Orientation Level: Situation Current Attention Level: Sustained Memory: Decreased recall of precautions, Decreased short-term memory Following  Commands: Follows one step commands with increased time Safety/Judgement: Decreased awareness of safety, Decreased awareness of deficits Awareness: Emergent Problem Solving: Slow processing, Difficulty sequencing, Requires verbal cues, Requires tactile cues General Comments: Patient alert and engaging, increased difficulty noted with STM, as well as upper level tasks (unable to spell WORLD backwards, despite increased attempts) fleeting attention noted at times during session. Patient admits that she doesnt feel like herself     General Comments       Exercises     Shoulder Instructions      Home Living Family/patient expects to be discharged to:: Skilled nursing facility                                 Additional Comments: Resident at [facility].      Prior Functioning/Environment Prior Level of Function : Needs assist;History of Falls (last six months)             Mobility Comments: States until recently she has been transferring herself in/out of bed to w/c. Able to mobilize W/c independently. ADLs Comments: States she was able to bath/dress herself at [facility] but over the last couple of weeks has needed help.        OT Problem List: Decreased strength;Decreased range of motion;Decreased activity tolerance;Impaired balance (sitting and/or standing);Impaired vision/perception;Decreased coordination;Decreased cognition;Decreased knowledge of use of DME or AE;Decreased safety awareness;Obesity;Pain      OT Treatment/Interventions: Self-care/ADL training;Therapeutic exercise;Neuromuscular education;DME and/or AE instruction;Energy conservation;Cognitive remediation/compensation;Therapeutic activities;Visual/perceptual remediation/compensation;Patient/family education;Balance training    OT Goals(Current goals can be found in the care plan section) Acute Rehab OT Goals Patient Stated Goal: to get better  OT Goal Formulation: With patient Time For Goal Achievement:  01/24/23 Potential to Achieve Goals: Fair  OT Frequency: Min 1X/week    Co-evaluation              AM-PAC OT "6 Clicks" Daily Activity     Outcome Measure Help from another person eating meals?: None Help from another person taking care of personal grooming?: None Help from another person toileting, which includes using toliet, bedpan, or urinal?: A Lot Help from another person bathing (including washing, rinsing, drying)?: A Lot Help from another person to put on and taking off regular upper body clothing?: A Little Help from another person to put on and taking off regular lower body clothing?: A Lot 6 Click Score: 17   End of Session Nurse Communication: Mobility status  Activity Tolerance: Patient limited by fatigue Patient left: in chair;with call bell/phone within reach;with chair alarm set  OT Visit Diagnosis: Unsteadiness on feet (R26.81);Other abnormalities of gait and mobility (R26.89);Muscle weakness (generalized) (M62.81);Other symptoms and signs involving cognitive function                Time: 1351-1420 OT Time Calculation (min): 29 min Charges:  OT General Charges $OT Visit: 1 Visit OT Evaluation $OT Eval Moderate Complexity: 1 Mod OT Treatments $Self Care/Home Management : 8-22 mins  Pollyann Glen E. Lucella Pommier, OTR/L Acute Rehabilitation Services [phone]   Cherlyn Cushing 01/10/2023, 3:28 PM

## 2023-01-10 NOTE — Evaluation (Signed)
Physical Therapy Evaluation Patient Details Name: [name] MRN: [id number] DOB: 10-08-1950 Today's Date: 01/10/2023  History of Present Illness  72 y.o. female presented to [facility] ED on 8/16 from [facility] after she was noted to have increasing weakness over the last day. PMHx: chronic combined systolic/diastolic congestive heart failure (EF 25-30% 2023), HTN, HLD, type 2 diabetes mellitus, hypothyroidism, CVA, Hx SDH with multiple falls s/p craniotomy, Hx ascending aortic aneurysm s/p resection/grafting and placement of Saint Jude mechanical AVR, tachy/bradycardia syndrome s/p PPM, persistent atrial fibrillation on Coumadin, history of QT prolongation, bipolar disorder, anxiety, chronic low back pain, debilitation, carotid artery disease, CKD stage IIIa, memory impairment/dementia, obesity.  Clinical Impression  Pt admitted with above diagnosis. Pt from [facility], not receiving formal PT per report. Was capable of transferring herself to w/c, mobilizing self in W/c, and performing bath/dress on her own until recently. Reports several falls during transfers and now needs assist to bath/dress. Pt eager to improve functional independence back to baseline. During evaluation requires mod assist for bed mobility, and sit to stand transfers (utilized [place] for safety today due to reported Rt knee buckling and multiple falls.) Able to participate with pre-gait activity in [place]. Performed LE exercises. Encouraged perform routinely during admission to limit rate of mm. atrophy.  Pt currently with functional limitations due to the deficits listed below (see PT Problem List).  Patient will benefit from continued inpatient follow up therapy, <3 hours/day. Pt will benefit from acute skilled PT to increase their independence and safety with mobility to allow discharge.           If plan is discharge home, recommend the following:  (Back to SNF)   Can travel by private vehicle   No     Equipment Recommendations None recommended by PT  Recommendations for Other Services       Functional Status Assessment Patient has had a recent decline in their functional status and demonstrates the ability to make significant improvements in function in a reasonable and predictable amount of time.     Precautions / Restrictions Precautions Precautions: Fall Restrictions Weight Bearing Restrictions: No      Mobility  Bed Mobility Overal bed mobility: Needs Assistance Bed Mobility: Rolling, Sidelying to Sit Rolling: Min assist, Used rails Sidelying to sit: Mod assist, Used rails, HOB elevated       General bed mobility comments: Min assist to roll towards Rt side with cues for Rail use, a little assist for LEs to EOB. Mod assist for trunk support to rise. slow and leaning towards Rt side.    Transfers Overall transfer level: Needs assistance Equipment used: Ambulation equipment used Transfers: Sit to/from Stand, Bed to chair/wheelchair/BSC Sit to Stand: Mod assist, Min assist           General transfer comment: Antony Salmon utilized due to pt reports Rt knee buckles and has had multiple falls. Required mod assist initially from elevated bed surface. Cues for midline awareness (leaning Rt.) Improved with repetition to min assist, then again min assist from recliner chair with Stedy. Uses horiziontal rail to pull. Transfer via Lift Equipment: Stedy  Ambulation/Gait             Pre-gait activities: Weight shift on Stedy, able to stabilize and gently lift feet from platform with bil UE support and no utilization of knee pad block. General Gait Details: Deferred for +2 assist / safety  Stairs  Wheelchair Mobility     Tilt Bed    Modified Rankin (Stroke Patients Only) Modified Rankin (Stroke Patients Only) Pre-Morbid Rankin Score: Moderately severe disability Modified Rankin: Severe disability     Balance Overall balance assessment: Needs  assistance, History of Falls Sitting-balance support: No upper extremity supported, Feet supported Sitting balance-Leahy Scale: Fair Sitting balance - Comments: Initially min assist, progressed to CGA Postural control: Right lateral lean Standing balance support: Bilateral upper extremity supported Standing balance-Leahy Scale: Poor                               Pertinent Vitals/Pain Pain Assessment Pain Assessment: Faces Faces Pain Scale: Hurts even more Pain Location: back Pain Descriptors / Indicators: Aching, Constant Pain Intervention(s): Monitored during session, Repositioned, Limited activity within patient's tolerance    Home Living Family/patient expects to be discharged to:: Skilled nursing facility                   Additional Comments: Resident at [facility].    Prior Function Prior Level of Function : Needs assist;History of Falls (last six months)             Mobility Comments: States until recently she has been transferring herself in/out of bed to w/c. Able to mobilize W/c independently. ADLs Comments: States she was able to bath/dress herself at [facility] but over the last couple of weeks has needed help.     Extremity/Trunk Assessment   Upper Extremity Assessment Upper Extremity Assessment: Defer to OT evaluation    Lower Extremity Assessment Lower Extremity Assessment: Generalized weakness (Some excessive mobility Rt knee, possibly more than expected for TKA. Not overtly painful at end ranges.)       Communication   Communication Communication: No apparent difficulties Cueing Techniques: Verbal cues  Cognition Arousal: Alert Behavior During Therapy: WFL for tasks assessed/performed Overall Cognitive Status: No family/caregiver present to determine baseline cognitive functioning                                 General Comments: Answers questions appropriately.        General Comments      Exercises General  Exercises - Lower Extremity Ankle Circles/Pumps: AROM, Both, 10 reps, Seated Quad Sets: Strengthening, Both, 10 reps, Seated Gluteal Sets: Strengthening, Both, 10 reps, Seated Long Arc Quad: Strengthening, Both, 10 reps, Seated   Assessment/Plan    PT Assessment Patient needs continued PT services  PT Problem List Decreased strength;Decreased range of motion;Decreased activity tolerance;Decreased balance;Decreased mobility;Decreased knowledge of use of DME;Decreased knowledge of precautions;Obesity;Pain       PT Treatment Interventions DME instruction;Functional mobility training;Therapeutic activities;Therapeutic exercise;Balance training;Neuromuscular re-education;Patient/family education;Wheelchair mobility training;Modalities    PT Goals (Current goals can be found in the Care Plan section)  Acute Rehab PT Goals Patient Stated Goal: Get stronger, reduce pain PT Goal Formulation: With patient Time For Goal Achievement: 01/24/23 Potential to Achieve Goals: Fair    Frequency Min 1X/week     Co-evaluation               AM-PAC PT "6 Clicks" Mobility  Outcome Measure Help needed turning from your back to your side while in a flat bed without using bedrails?: A Little Help needed moving from lying on your back to sitting on the side of a flat bed without using bedrails?: A Lot Help needed moving  to and from a bed to a chair (including a wheelchair)?: A Lot Help needed standing up from a chair using your arms (e.g., wheelchair or bedside chair)?: A Lot Help needed to walk in hospital room?: Total Help needed climbing 3-5 steps with a railing? : Total 6 Click Score: 11    End of Session Equipment Utilized During Treatment: Gait belt Activity Tolerance: Patient tolerated treatment well Patient left: in chair;with call bell/phone within reach;with chair alarm set Nurse Communication: Mobility status PT Visit Diagnosis: Unsteadiness on feet (R26.81);Difficulty in walking, not  elsewhere classified (R26.2);Muscle weakness (generalized) (M62.81);History of falling (Z91.81);Repeated falls (R29.6);Pain Pain - part of body:  (back)    Time: 2952-8413 PT Time Calculation (min) (ACUTE ONLY): 33 min   Charges:   PT Evaluation $PT Eval Moderate Complexity: 1 Mod PT Treatments $Therapeutic Activity: 8-22 mins PT General Charges $$ ACUTE PT VISIT: 1 Visit         Kathlyn Sacramento, PT, DPT [facility]  Rehabilitation Services Physical Therapist Office: [phone] Website: [email or web address]   Berton Mount 01/10/2023, 2:20 PM

## 2023-01-10 NOTE — Progress Notes (Addendum)
PROGRESS NOTE    [name]  GUY:[id number] DOB: 1951/05/15 DOA: 01/09/2023 PCP: Karna Dupes, MD    Brief Narrative:   [name] is a 72 y.o. female with past medical history significant for chronic combined systolic/diastolic congestive heart failure (EF 25-30% 2023), HTN, HLD, type 2 diabetes mellitus, hypothyroidism, CVA, Hx SDH with multiple falls s/p craniotomy, Hx ascending aortic aneurysm s/p resection/grafting and placement of Saint Jude mechanical AVR, tachy/bradycardia syndrome s/p PPM, persistent atrial fibrillation on Coumadin, history of QT prolongation, bipolar disorder, anxiety, chronic low back pain, debilitation (wheelchair bound per cardiology Dr. Shirlee Latch note 10/2022), carotid artery disease, CKD stage IIIa, memory impairment/dementia, obesity who presented to [facility] ED on 8/16 from [facility] after she was noted to have increasing weakness over the last day.  Now requiring increasing assistance for activities including getting out of bed, staff noted weakness greater on the right than left.  Patient denied fever, no chills, no chest pain, no shortness of breath, no abdominal pain, no nausea/vomiting/diarrhea.  Patient was recently seen in the outpatient neurosurgery clinic, Dr. Dutch Quint who ordered MR L-spine.  Results are still pending; and has yet to be seen in follow-up.  In the ED, temperature 97.6 F, HR 60, RR 18, BP 122/58, SpO2 99% on room air.  WBC 7.0, hemoglobin 10.7, platelet count 217.  Sodium 141, potassium 6.1, chloride 100, CO2 26, glucose 93, BUN 52, creatinine 2.68.  AST 46, ALT 18, total bilirubin 0.7.  Hemoglobin A1c 5.2, urinalysis unrevealing.  EtOH level less than 10.  UDS positive for opiates.  CT head without contrast with no evidence of acute intracranial abnormality, mild dural thickening left cerebral convexity favored postsurgical given overlying craniotomy.  Patient received 500 mL IV fluid bolus in the ED.  TRH consulted for  admission for further evaluation management of concern for progressive weakness.  Assessment & Plan:   Acute renal failure on CKD stage IIIa Creatinine elevated 2.68 on admission with a baseline 1.7.  Etiology likely secondary to prerenal azotemia/dehydration in the setting of home diuretic/Entresto use -- Cr 2.68>>2.35 -- Holding home furosemide, spironolactone, Entresto -- Continue IVF with NS at 75 mL/h -- Monitor urine output -- BMP daily  Hyperkalemia: Resolved Potassium elevated 6.1 admission, likely secondary to acute on chronic renal failure.  Supported with IV fluid hydration with resolution. -- BMP daily  Generalized weakness Chronic low back pain Chronic debilitation Patient presenting from SNF with progressive weakness, staff report needing more assistance with ADLs, including to get out of the bed.  No paresthesias.  Although per outpatient cardiology note, Dr. Shirlee Latch on 10/2022 notes that she is wheelchair-bound.  Has recently seen neurosurgery, Dr. Dutch Quint outpatient with MR L-spine on 8/12; with results of multilevel lumbar spondylosis most pronounced at L4-5 w/ moderate right subarticular recess stenosis and mild right foraminal stenosis, no significant canal stenosis at any level.  Has yet to be seen in follow-up neurosurgery clinic.  CT head without contrast with no acute findings. -- MRI brain without contrast: Pending -- PT/OT evaluation -- Fall precautions, supportive care  Chronic combined systolic/diastolic congestive heart failure Essential hypertension Hx ascending aortic aneurysm s/p resection/grafting and placement of Saint Jude mechanical AVR Tachybradycardia syndrome s/p PPM Persistent atrial fibrillation on Coumadin Patient follows with cardiology/EP Dr. Shirlee Latch and Dr. Katrinka Blazing outpatient.  Last seen by Dr. Shirlee Latch on 11/21/2022.  Last TTE 2023 with LVEF 25-30%.  Home regimen includes amiodarone 200 mg p.o. daily, carvedilol 6.25 mg p.o.  twice daily, Entresto 49-51  mg p.o. twice daily, spironolactone 25 mg p.o. daily, furosemide 60 mg p.o. daily. -- Continue amiodarone 2 mg p.o. daily -- Continue carvedilol 6.25 mg p.o. twice daily -- Holding home spironolactone, furosemide and Entresto due to AKI as above -- INR subtherapeutic 2.0, pharmacy consulted for Coumadin dosing/monitoring with goal 2.5-3.5 with mechanical AVR --Lovenox bridge until INR therapeutic -- Strict I's and O's and daily weights -- INR daily  Bipolar disorder Anxiety Home regimen includes Lamictal 100 mg p.o. daily, 200 mg p.o. nightly, Effexor 75 mg p.o. daily -- Continue Lamictal 100 mg p.o. daily, 20 mg p.o. nightly -- Check Lamictal level  Hypothyroidism TSH 1.313, free T41.40. -- Continue levothyroxine 50 mcg p.o. daily  History of CVA History of SDH, multiple falls requiring craniotomy -- Fall precautions -- PT/OT evaluation -- Anticipate need to return to SNF, TOC consulted  Cognitive impairment/early dementia --Delirium precautions --Get up during the day --Encourage a familiar face to remain present throughout the day --Keep blinds open and lights on during daylight hours --Minimize the use of opioids/benzodiazepines  Wound left ankle Patient reports wound present for unclear amount of time, "years" and reports drainage from site.  Has not been evaluated previously.  Patient is afebrile without leukocytosis.  Unable to express any purulent material, no fluctuance, very minimal if no erythema surrounding site.  Given her renal function unable to obtain contrasted study. -- Wound RN consulted -- X-ray left ankle  Obesity Complicates all facets of care   DVT prophylaxis:  warfarin (COUMADIN) tablet 3 mg    Code Status: DNR Family Communication: No family present at bedside this morning  Disposition Plan:  Level of care: Telemetry Medical Status is: Observation The patient remains OBS appropriate and will d/c before 2 midnights.    Consultants:   None  Procedures:  TTE: Pending  Antimicrobials:  None   Subjective: Patient seen examined bedside, resting calmly.  Lying in bed.  Complaining of significant weakness to bilateral lower extremities.  Per review of outpatient cardiology notes, it appears that she is wheelchair-bound at baseline but staff at SNF report that she is having increasing difficulty with ADLs such as getting out of bed.  Pending MRI brain and TTE.  Recently had outpatient MR L-spine that was pending read, called radiology to have exam completed; reviewed report with no significant findings.  Continues on IV fluids.  No other specific questions or concerns at this time.  Denies headache, no dizziness, no chest pain, no palpitations, no shortness of breath, no abdominal pain, no fever/chills/night sweats, no nausea/vomiting/diarrhea, no fatigue, no paresthesias.  No acute events overnight per nursing.  Objective: Vitals:   01/10/23 0408 01/10/23 0816 01/10/23 1000 01/10/23 1135  BP:  (!) 109/53  111/72  Pulse:  (!) 59 62 63  Resp:  18  17  Temp:  98.2 F (36.8 C)  99 F (37.2 C)  TempSrc:  Oral  Oral  SpO2:  95%  97%  Weight: 110.8 kg     Height:        Intake/Output Summary (Last 24 hours) at 01/10/2023 1153 Last data filed at 01/10/2023 0352 Gross per 24 hour  Intake 964.11 ml  Output 850 ml  Net 114.11 ml   Filed Weights   01/09/23 2155 01/10/23 0408  Weight: 110.8 kg 110.8 kg    Examination:  Physical Exam: GEN: NAD, alert and oriented x 3, chronically ill in appearance, obese HEENT: NCAT, PERRL, EOMI, sclera clear, MMM  PULM: CTAB w/o wheezes/crackles, normal respiratory effort, on room air CV: RRR w/o M/G/R GI: abd soft, NTND, NABS, no R/G/M MSK: no peripheral edema, decreased muscle strength bilateral lower extremities 2/5 bilaterally, no paresthesias NEURO: CN II-XII intact, no focal deficits, sensation to light touch intact, muscle strength bilateral lower extremities as above PSYCH:  normal mood/affect Integumentary: wound noted left lateral malleolus, No other, concerning rashes/lesions/wounds noted on exposed skin surfaces.    Data Reviewed: I have personally reviewed following labs and imaging studies  CBC: Recent Labs  Lab 01/09/23 1309 01/09/23 1339 01/10/23 0625  WBC 7.0  --  8.4  NEUTROABS 5.0  --   --   HGB 10.7* 11.9* 11.4*  HCT 34.4* 35.0* 36.9  MCV 101.8*  --  101.4*  PLT 217  --  221   Basic Metabolic Panel: Recent Labs  Lab 01/09/23 1309 01/09/23 1339 01/09/23 2015 01/10/23 0625  NA 141 139 138 140  K 6.1* 4.7 4.1 4.2  CL 100 103 100 102  CO2 26  --  30 29  GLUCOSE 93 96 137* 113*  BUN 52* 48* 46* 43*  CREATININE 2.68* 2.90* 2.53* 2.35*  CALCIUM 8.9  --  8.8* 9.1   GFR: Estimated Creatinine Clearance: 28.3 mL/min (A) (by C-G formula based on SCr of 2.35 mg/dL (H)). Liver Function Tests: Recent Labs  Lab 01/09/23 1309 01/09/23 2015 01/10/23 0625  AST 46* 31 32  ALT 18 18 19   ALKPHOS 23* 21* 26*  BILITOT 0.7 0.8 0.6  PROT 6.6 6.7 6.7  ALBUMIN 3.9 3.6 3.8   No results for input(s): "LIPASE", "AMYLASE" in the last 168 hours. No results for input(s): "AMMONIA" in the last 168 hours. Coagulation Profile: Recent Labs  Lab 01/09/23 1309 01/10/23 0625  INR 2.0* 2.0*   Cardiac Enzymes: No results for input(s): "CKTOTAL", "CKMB", "CKMBINDEX", "TROPONINI" in the last 168 hours. BNP (last 3 results) No results for input(s): "PROBNP" in the last 8760 hours. HbA1C: Recent Labs    01/09/23 2015  HGBA1C 5.2   CBG: Recent Labs  Lab 01/09/23 2225 01/09/23 2315 01/10/23 0358 01/10/23 0606 01/10/23 1134  GLUCAP 134* 133* 103* 108* 171*   Lipid Profile: No results for input(s): "CHOL", "HDL", "LDLCALC", "TRIG", "CHOLHDL", "LDLDIRECT" in the last 72 hours. Thyroid Function Tests: Recent Labs    01/10/23 1017  TSH 1.313  FREET4 1.40*   Anemia Panel: No results for input(s): "VITAMINB12", "FOLATE", "FERRITIN", "TIBC",  "IRON", "RETICCTPCT" in the last 72 hours. Sepsis Labs: No results for input(s): "PROCALCITON", "LATICACIDVEN" in the last 168 hours.  No results found for this or any previous visit (from the past 240 hour(s)).       Radiology Studies: CT Head Wo Contrast  Result Date: 01/09/2023 CLINICAL DATA:  Neuro deficit, acute, stroke suspected EXAM: CT HEAD WITHOUT CONTRAST TECHNIQUE: Contiguous axial images were obtained from the base of the skull through the vertex without intravenous contrast. RADIATION DOSE REDUCTION: This exam was performed according to the departmental dose-optimization program which includes automated exposure control, adjustment of the mA and/or kV according to patient size and/or use of iterative reconstruction technique. COMPARISON:  CT head 06/13/2020. FINDINGS: Brain: No convincing evidence of acute hemorrhage. No evidence of acute large vascular territory infarction, hydrocephalus, extra-axial collection or mass lesion/mass effect. Mild dural thickening along the left cerebral convexity with areas of mineralization, favored postsurgical given overlying craniotomy. Vascular: No hyperdense vessel.  Calcific atherosclerosis. Skull: No acute fracture.  Left craniotomy. Sinuses/Orbits: Clear sinuses per  neck overall findings. IMPRESSION: 1. No evidence of acute intracranial abnormality. 2. Mild dural thickening along the left cerebral convexity with areas of mineralization, favored postsurgical given overlying craniotomy. Electronically Signed   By: Feliberto Harts M.D.   On: 01/09/2023 15:17        Scheduled Meds:  amiodarone  200 mg Oral Daily   carvedilol  6.25 mg Oral BID WC   dapagliflozin propanediol  10 mg Oral Daily   enoxaparin (LOVENOX) injection  100 mg Subcutaneous Q24H   fenofibrate  160 mg Oral Daily   insulin aspart  0-9 Units Subcutaneous TID WC   lamoTRIgine  100 mg Oral Daily   lamoTRIgine  200 mg Oral QHS   levothyroxine  50 mcg Oral QAC breakfast    melatonin  10 mg Oral QHS   pantoprazole  20 mg Oral QODAY   rosuvastatin  40 mg Oral Daily   sodium chloride flush  3 mL Intravenous Q12H   traZODone  150 mg Oral QHS   warfarin  3 mg Oral ONCE-1600   Warfarin - Pharmacist Dosing Inpatient   Does not apply q1600   Continuous Infusions:  sodium chloride 75 mL/hr at 01/10/23 0958     LOS: 0 days    Time spent: 52 minutes spent on chart review, discussion with nursing staff, consultants, updating family and interview/physical exam; more than 50% of that time was spent in counseling and/or coordination of care.    Alvira Philips [place], DO [facility] Available via Epic secure chat 7am-7pm After these hours, please refer to coverage provider listed on [email or web address] 01/10/2023, 11:53 AM

## 2023-01-10 NOTE — Progress Notes (Addendum)
ANTICOAGULATION CONSULT NOTE - Consult  Pharmacy Consult for warfarin Indication: atrial fibrillation and mechanical aortic valve  Allergies  Allergen Reactions   Mucinex [Guaifenesin Er] Other (See Comments)    Severe headaches    Keflex [Cephalexin] Other (See Comments)    Headaches Dizziness    Mucinex [Guaifenesin Er] Other (See Comments)    Severe headaches   Sulfa Antibiotics Other (See Comments)    Headaches    Sulfonamide Derivatives Other (See Comments)    Headaches     Patient Measurements: Height: 5\' 8"  (172.7 cm) Weight: 110.8 kg (244 lb 4.3 oz) IBW/kg (Calculated) : 63.9  Vital Signs: Temp: 98.4 F (36.9 C) (08/17 0358) Temp Source: Oral (08/17 0358) BP: 102/46 (08/17 0358) Pulse Rate: 59 (08/17 0358)  Labs: Recent Labs    01/09/23 1309 01/09/23 1339 01/09/23 2015 01/10/23 0625  HGB 10.7* 11.9*  --  11.4*  HCT 34.4* 35.0*  --  36.9  PLT 217  --   --  221  APTT 41*  --   --   --   LABPROT 23.0*  --   --  22.6*  INR 2.0*  --   --  2.0*  CREATININE 2.68* 2.90* 2.53* 2.35*    Estimated Creatinine Clearance: 28.3 mL/min (A) (by C-G formula based on SCr of 2.35 mg/dL (H)).   Medical History: Past Medical History:  Diagnosis Date   Acute cystitis without hematuria 07/24/2020   Acute hypoxemic respiratory failure (HCC) 06/27/2021   Acute on chronic combined systolic and diastolic CHF (congestive heart failure) (HCC) 07/24/2020   Altered mental status 04/29/2020   ANEMIA 05/28/2010   Ankle pain 02/04/2018   AORTIC VALVE REPLACEMENT, HX OF 03/11/2010   Mechanical prosthesis   Ascending aortic aneurysm (HCC)    Atrial fibrillation (HCC)    Atrial flutter (HCC)    BIPOLAR AFFECTIVE DISORDER 03/11/2010   Bradycardia    Cellulitis and abscess of left leg 11/18/2010   Chronic diastolic CHF (congestive heart failure) (HCC)    Chronic kidney disease    Constipation 03/11/2010   Qualifier: Diagnosis of   By: Abner Greenspan MD, Stacey         Depression     Diabetes (HCC)    Diverticulitis 12/18/2010   Elevated TSH 04/30/2020   Fall at home, initial encounter 07/24/2020   Falls    FIBROIDS, UTERUS 03/11/2010   Gout 09/04/2016   Grave's disease 12/2010   Hyperlipidemia associated with type 2 diabetes mellitus (HCC) 06/05/2016   Hypertension    Hyperthyroidism 12/18/2010   In the context of amiodarone, resolved off it     Low back pain 11/02/2017   Migraine 06/05/2016   Mixed hyperlipidemia 03/11/2010   Near syncope 08/21/2019   Obesity    Pacemaker    Prolonged QT interval    SAH (subarachnoid hemorrhage) (HCC)    Skin cancer    Subarachnoid hematoma (HCC) 07/24/2020   Subdural hematoma (HCC)    Supratherapeutic INR 04/30/2020   Syncope 08/22/2019   UTI (lower urinary tract infection) 08/14/2011   UTI (urinary tract infection) 02/10/2013    Medications:  Scheduled:   amiodarone  200 mg Oral Daily   carvedilol  6.25 mg Oral BID WC   dapagliflozin propanediol  10 mg Oral Daily   fenofibrate  160 mg Oral Daily   insulin aspart  0-9 Units Subcutaneous TID WC   lamoTRIgine  100 mg Oral Daily   lamoTRIgine  200 mg Oral QHS   levothyroxine  50  mcg Oral QAC breakfast   melatonin  10 mg Oral QHS   pantoprazole  20 mg Oral QODAY   rosuvastatin  40 mg Oral Daily   sodium chloride flush  3 mL Intravenous Q12H   traZODone  150 mg Oral QHS   Warfarin - Pharmacist Dosing Inpatient   Does not apply q1600   Infusions:   sodium chloride 75 mL/hr at 01/10/23 8295    Assessment: 72 yo female on warfarin PTA for afib and mechanical aortic valve. Was taking 2 mg daily, though INR returned 4.0 on Monday. Warfarin was on hold from Monday-Friday for elevated INR and was to be restarted on 2 mg T/Th/Sat and 1.5 mg on all other days.    INR 2.0, subtherapeutic. Hgb 11.4, Plt 221, stable. No signs or symptoms of bleeding reported per RN.  Per Dr. [place], will bridge with therapeutic lovenox until therapeutic on warfarin. Scr 2.35, trending  down. Will adjust lovenox dosing for renal function.   Goal of Therapy:  INR 2.5-3.5 Monitor platelets by anticoagulation protocol: Yes   Plan:  Give warfarin 3 mg x 1 tonight  Initiate lovenox 100mg  daily until INR at goal Monitor CBC, INR, and signs/symptoms of bleeding   Stephenie Acres, PharmD PGY1 Pharmacy Resident 01/10/2023 8:06 AM

## 2023-01-10 NOTE — Plan of Care (Signed)

## 2023-01-11 DIAGNOSIS — R29818 Other symptoms and signs involving the nervous system: Secondary | ICD-10-CM | POA: Diagnosis not present

## 2023-01-11 LAB — PROTIME-INR
INR: 2.1 — ABNORMAL HIGH (ref 0.8–1.2)
Prothrombin Time: 24 s — ABNORMAL HIGH (ref 11.4–15.2)

## 2023-01-11 LAB — BASIC METABOLIC PANEL
Anion gap: 11 (ref 5–15)
BUN: 30 mg/dL — ABNORMAL HIGH (ref 8–23)
CO2: 23 mmol/L (ref 22–32)
Calcium: 8.8 mg/dL — ABNORMAL LOW (ref 8.9–10.3)
Chloride: 103 mmol/L (ref 98–111)
Creatinine, Ser: 1.67 mg/dL — ABNORMAL HIGH (ref 0.44–1.00)
GFR, Estimated: 32 mL/min — ABNORMAL LOW (ref >60)
Glucose, Bld: 96 mg/dL (ref 70–99)
Potassium: 3.9 mmol/L (ref 3.5–5.1)
Sodium: 137 mmol/L (ref 135–145)

## 2023-01-11 LAB — GLUCOSE, CAPILLARY
Glucose-Capillary: 100 mg/dL — ABNORMAL HIGH (ref 70–99)
Glucose-Capillary: 127 mg/dL — ABNORMAL HIGH (ref 70–99)
Glucose-Capillary: 115 mg/dL — ABNORMAL HIGH (ref 70–99)
Glucose-Capillary: 142 mg/dL — ABNORMAL HIGH (ref 70–99)

## 2023-01-11 MED ORDER — ENOXAPARIN SODIUM 100 MG/ML IJ SOSY
100.0000 mg | PREFILLED_SYRINGE | Freq: Two times a day (BID) | INTRAMUSCULAR | Status: DC
  Administered 2023-01-11 – 2023-01-13 (×5): 100 mg via SUBCUTANEOUS
  Filled 2023-01-11 (×6): qty 1

## 2023-01-11 MED ORDER — WARFARIN SODIUM 3 MG PO TABS
3.0000 mg | ORAL_TABLET | Freq: Once | ORAL | Status: AC
  Administered 2023-01-11: 3 mg via ORAL
  Filled 2023-01-11: qty 1

## 2023-01-11 MED ORDER — NAPHAZOLINE-GLYCERIN 0.012-0.25 % OP SOLN
1.0000 [drp] | Freq: Four times a day (QID) | OPHTHALMIC | Status: DC | PRN
  Administered 2023-01-12: 1 [drp] via OPHTHALMIC
  Administered 2023-01-13: 2 [drp] via OPHTHALMIC
  Filled 2023-01-11: qty 15

## 2023-01-11 MED ORDER — ALBUTEROL SULFATE (2.5 MG/3ML) 0.083% IN NEBU: 3.0000 mL | INHALATION_SOLUTION | Freq: Four times a day (QID) | RESPIRATORY_TRACT | Status: DC | PRN

## 2023-01-11 NOTE — TOC Initial Note (Addendum)
Transition of Care [facility]) - Initial/Assessment Note    Patient Details  Name: [name] MRN: [id number] Date of Birth: 1950/08/21  [facility]) CM/SW Contact:    Deatra Robinson, LCSW Phone Number: 01/11/2023, 9:27 AM  Clinical Narrative:  Pt admitted from [facility] where she appears to be a LTC resident. Message left with [facility] admissions to confirm pt's status, await response. SW will follow.   UPDATE 1230: confirmed with [facility] admissions pt is LTC and able to return at dc.   Dellie Burns, MSW, LCSW [phone] (coverage)                   Expected Discharge Plan: Skilled Nursing Facility Barriers to Discharge: Continued Medical Work up   Patient Goals and CMS Choice            Expected Discharge Plan and Services       Living arrangements for the past 2 months: Skilled Nursing Facility                                      Prior Living Arrangements/Services Living arrangements for the past 2 months: Skilled Nursing Facility Lives with:: Facility Resident          Need for Family Participation in Patient Care: Yes (Comment) Care giver support system in place?: Yes (comment)   Criminal Activity/Legal Involvement Pertinent to Current Situation/Hospitalization: No - Comment as needed  Activities of Daily Living Home Assistive Devices/Equipment: Wheelchair ADL Screening (condition at time of admission) Patient's cognitive ability adequate to safely complete daily activities?: Yes Is the patient deaf or have difficulty hearing?: No Does the patient have difficulty seeing, even when wearing glasses/contacts?: Yes Does the patient have difficulty concentrating, remembering, or making decisions?: No Patient able to express need for assistance with ADLs?: Yes Does the patient have difficulty dressing or bathing?: Yes Independently performs ADLs?: No Communication: Independent Dressing (OT): Dependent Is this a change from  baseline?: Pre-admission baseline Grooming: Dependent Is this a change from baseline?: Pre-admission baseline Feeding: Independent Bathing: Dependent Is this a change from baseline?: Pre-admission baseline Toileting: Dependent Is this a change from baseline?: Pre-admission baseline In/Out Bed: Dependent Is this a change from baseline?: Pre-admission baseline Walks in Home: Needs assistance Is this a change from baseline?: Pre-admission baseline Does the patient have difficulty walking or climbing stairs?: Yes Weakness of Legs: Both Weakness of Arms/Hands: None  Permission Sought/Granted                  Emotional Assessment       Orientation: : Oriented to Self, Oriented to Place, Oriented to  Time, Oriented to Situation Alcohol / Substance Use: Not Applicable Psych Involvement: No (comment)  Admission diagnosis:  Weakness [R53.1] AKI (acute kidney injury) (HCC) [N17.9] Acute focal neurological deficit [R29.818] Patient Active Problem List   Diagnosis Date Noted   Focal neurological deficit 01/09/2023   Acute focal neurological deficit 01/09/2023   History of CVA (cerebrovascular accident) 07/03/2021   Stage 3a chronic kidney disease (CKD) (HCC) 07/03/2021   Anemia of chronic kidney failure, stage 3 (moderate) (HCC) 07/03/2021   Obesity (BMI 30-39.9) 07/03/2021   Essential hypertension 07/24/2020   GERD without esophagitis 07/24/2020   Bipolar disorder (HCC) 07/24/2020   Hypothyroidism 07/24/2020   Acute renal failure superimposed on stage 3a chronic kidney disease (HCC) 07/24/2020   Vitamin B 12 deficiency  05/06/2020   Elevated liver enzymes 04/30/2020   Tachycardia-bradycardia syndrome (HCC) 04/25/2020   Pacemaker 04/25/2020   Recurrent falls 03/27/2020   Bradycardia 08/23/2019   Prolonged QT interval    GAD (generalized anxiety disorder) 04/25/2018   Nocturia 11/02/2017   Low back pain 11/02/2017   Type 2 diabetes mellitus with diabetic polyneuropathy,  without long-term current use of insulin (HCC) 11/02/2017   Hyperlipidemia associated with type 2 diabetes mellitus (HCC) 06/05/2016   Migraine 06/05/2016   Atrial fibrillation (HCC) 03/18/2013   Long term current use of anticoagulant therapy 03/11/2013   Pernicious anemia 02/21/2011   DIVERTICULOSIS OF COLON 03/11/2010   Hx of mechanical aortic valve replacement 03/11/2010   PCP:  Karna Dupes, MD Pharmacy:  No Pharmacies Listed    Social Determinants of Health (SDOH) Social History: SDOH Screenings   Food Insecurity: No Food Insecurity (01/09/2023)  Housing: Low Risk  (01/09/2023)  Transportation Needs: No Transportation Needs (01/09/2023)  Utilities: Not At Risk (01/09/2023)  Tobacco Use: Medium Risk (01/09/2023)   SDOH Interventions:     Readmission Risk Interventions    05/03/2020   10:59 AM  Readmission Risk Prevention Plan  Transportation Screening Complete  Home Care Screening Complete  Medication Review (RN CM) Complete

## 2023-01-11 NOTE — Progress Notes (Signed)
ANTICOAGULATION CONSULT NOTE - Consult  Pharmacy Consult for warfarin Indication: atrial fibrillation and mechanical aortic valve  Allergies  Allergen Reactions   Mucinex [Guaifenesin Er] Other (See Comments)    Severe headaches    Keflex [Cephalexin] Other (See Comments)    Headaches Dizziness    Mucinex [Guaifenesin Er] Other (See Comments)    Severe headaches   Sulfa Antibiotics Other (See Comments)    Headaches    Sulfonamide Derivatives Other (See Comments)    Headaches     Patient Measurements: Height: 5\' 8"  (172.7 cm) Weight: 111.6 kg (246 lb 0.5 oz) IBW/kg (Calculated) : 63.9  Vital Signs: Temp: 99 F (37.2 C) (08/18 0735) Temp Source: Oral (08/18 0735) BP: 112/60 (08/18 0735) Pulse Rate: 71 (08/18 0735)  Labs: Recent Labs    01/09/23 1309 01/09/23 1339 01/09/23 2015 01/10/23 0625 01/11/23 0447  HGB 10.7* 11.9*  --  11.4*  --   HCT 34.4* 35.0*  --  36.9  --   PLT 217  --   --  221  --   APTT 41*  --   --   --   --   LABPROT 23.0*  --   --  22.6* 24.0*  INR 2.0*  --   --  2.0* 2.1*  CREATININE 2.68* 2.90* 2.53* 2.35* 1.67*    Estimated Creatinine Clearance: 39.9 mL/min (A) (by C-G formula based on SCr of 1.67 mg/dL (H)).   Medical History: Past Medical History:  Diagnosis Date   Acute cystitis without hematuria 07/24/2020   Acute hypoxemic respiratory failure (HCC) 06/27/2021   Acute on chronic combined systolic and diastolic CHF (congestive heart failure) (HCC) 07/24/2020   Altered mental status 04/29/2020   ANEMIA 05/28/2010   Ankle pain 02/04/2018   AORTIC VALVE REPLACEMENT, HX OF 03/11/2010   Mechanical prosthesis   Ascending aortic aneurysm (HCC)    Atrial fibrillation (HCC)    Atrial flutter (HCC)    BIPOLAR AFFECTIVE DISORDER 03/11/2010   Bradycardia    Cellulitis and abscess of left leg 11/18/2010   Chronic diastolic CHF (congestive heart failure) (HCC)    Chronic kidney disease    Constipation 03/11/2010   Qualifier: Diagnosis  of   By: Abner Greenspan MD, Stacey         Depression    Diabetes (HCC)    Diverticulitis 12/18/2010   Elevated TSH 04/30/2020   Fall at home, initial encounter 07/24/2020   Falls    FIBROIDS, UTERUS 03/11/2010   Gout 09/04/2016   Grave's disease 12/2010   Hyperlipidemia associated with type 2 diabetes mellitus (HCC) 06/05/2016   Hypertension    Hyperthyroidism 12/18/2010   In the context of amiodarone, resolved off it     Low back pain 11/02/2017   Migraine 06/05/2016   Mixed hyperlipidemia 03/11/2010   Near syncope 08/21/2019   Obesity    Pacemaker    Prolonged QT interval    SAH (subarachnoid hemorrhage) (HCC)    Skin cancer    Subarachnoid hematoma (HCC) 07/24/2020   Subdural hematoma (HCC)    Supratherapeutic INR 04/30/2020   Syncope 08/22/2019   UTI (lower urinary tract infection) 08/14/2011   UTI (urinary tract infection) 02/10/2013    Medications:  Scheduled:   amiodarone  200 mg Oral Daily   carvedilol  6.25 mg Oral BID WC   dapagliflozin propanediol  10 mg Oral Daily   enoxaparin (LOVENOX) injection  100 mg Subcutaneous Q24H   fenofibrate  160 mg Oral Daily   fluticasone  1 spray Each Nare BID   insulin aspart  0-9 Units Subcutaneous TID WC   lamoTRIgine  100 mg Oral Daily   lamoTRIgine  200 mg Oral QHS   levothyroxine  50 mcg Oral QAC breakfast   melatonin  10 mg Oral QHS   pantoprazole  20 mg Oral QODAY   rosuvastatin  40 mg Oral Daily   sodium chloride flush  3 mL Intravenous Q12H   traZODone  150 mg Oral QHS   venlafaxine  75 mg Oral Daily   Warfarin - Pharmacist Dosing Inpatient   Does not apply q1600   Infusions:   sodium chloride 75 mL/hr at 01/10/23 2952    Assessment: 72 yo female on warfarin PTA for afib and mechanical aortic valve. Was taking 2 mg daily, though INR returned 4.0 on Monday. Warfarin was on hold from Monday-Friday for elevated INR and was to be restarted on 2 mg T/Th/Sat and 1.5 mg on all other days.    INR 2.1, subtherapeutic. 8/17  Hgb 11.4, Plt 221, stable (No CBC 8/18). No signs or symptoms of bleeding reported.  Per Dr. [place], will bridge with therapeutic lovenox until therapeutic on warfarin. Scr 1.67, significant improvement from 2.35 on 8/17. Will adjust lovenox dosing for renal function.   Goal of Therapy:  INR 2.5-3.5 Monitor platelets by anticoagulation protocol: Yes   Plan:  Give warfarin 3 mg x 1 tonight  Increase lovenox to 100mg  q12h and continue until INR at goal Monitor CBC, INR, and signs/symptoms of bleeding   Stephenie Acres, PharmD PGY1 Pharmacy Resident 01/11/2023 7:43 AM

## 2023-01-11 NOTE — Plan of Care (Signed)

## 2023-01-11 NOTE — Progress Notes (Addendum)
PROGRESS NOTE    [name]  DVV:[id number] DOB: [birth year] DOA: 01/09/2023 PCP: Karna Dupes, MD    Brief Narrative:   [name] is a 72 y.o. female with past medical history significant for chronic combined systolic/diastolic congestive heart failure (EF 25-30% 2023), HTN, HLD, type 2 diabetes mellitus, hypothyroidism, CVA, Hx SDH with multiple falls s/p craniotomy, Hx ascending aortic aneurysm s/p resection/grafting and placement of Saint Jude mechanical AVR, tachy/bradycardia syndrome s/p PPM, persistent atrial fibrillation on Coumadin, history of QT prolongation, bipolar disorder, anxiety, chronic low back pain, debilitation (wheelchair bound per cardiology Dr. Shirlee Latch note 10/2022), carotid artery disease, CKD stage IIIa, memory impairment/dementia, obesity who presented to [facility] ED on 8/16 from [facility] after she was noted to have increasing weakness over the last day.  Now requiring increasing assistance for activities including getting out of bed, staff noted weakness greater on the right than left.  Patient denied fever, no chills, no chest pain, no shortness of breath, no abdominal pain, no nausea/vomiting/diarrhea.  Patient was recently seen in the outpatient neurosurgery clinic, Dr. Dutch Quint who ordered MR L-spine.  Results are still pending; and has yet to be seen in follow-up.  In the ED, temperature 97.6 F, HR 60, RR 18, BP 122/58, SpO2 99% on room air.  WBC 7.0, hemoglobin 10.7, platelet count 217.  Sodium 141, potassium 6.1, chloride 100, CO2 26, glucose 93, BUN 52, creatinine 2.68.  AST 46, ALT 18, total bilirubin 0.7.  Hemoglobin A1c 5.2, urinalysis unrevealing.  EtOH level less than 10.  UDS positive for opiates.  CT head without contrast with no evidence of acute intracranial abnormality, mild dural thickening left cerebral convexity favored postsurgical given overlying craniotomy.  Patient received 500 mL IV fluid bolus in the ED.  TRH consulted for  admission for further evaluation management of concern for progressive weakness.  Assessment & Plan:   Acute renal failure on CKD stage IIIa Creatinine elevated 2.68 on admission with a baseline 1.7.  Etiology likely secondary to prerenal azotemia/dehydration in the setting of home diuretic/Entresto use -- Cr 2.68>>2.35>1.67 -- Holding home furosemide, spironolactone, Entresto -- Continue IVF with NS at 75 mL/h -- Monitor urine output -- BMP daily  Hyperkalemia: Resolved Potassium elevated 6.1 admission, likely secondary to acute on chronic renal failure.  Supported with IV fluid hydration with resolution. -- BMP daily  Generalized weakness Chronic low back pain Chronic debilitation Patient presenting from SNF with progressive weakness, staff report needing more assistance with ADLs, including to get out of the bed.  No paresthesias.  Although per outpatient cardiology note, Dr. Shirlee Latch on 10/2022 notes that she is wheelchair-bound.  Has recently seen neurosurgery, Dr. Dutch Quint outpatient with MR L-spine on 8/12; with results of multilevel lumbar spondylosis most pronounced at L4-5 w/ moderate right subarticular recess stenosis and mild right foraminal stenosis, no significant canal stenosis at any level.  Has yet to be seen in follow-up neurosurgery clinic.  CT head without contrast with no acute findings. -- MRI brain without contrast: Pending -- PT/OT evaluation: recommend return to SNF -- Fall precautions, supportive care  Chronic combined systolic/diastolic congestive heart failure Essential hypertension Hx ascending aortic aneurysm s/p resection/grafting and placement of Saint Jude mechanical AVR Tachybradycardia syndrome s/p PPM Persistent atrial fibrillation on Coumadin Patient follows with cardiology/EP Dr. Shirlee Latch and Dr. Katrinka Blazing outpatient.  Last seen by Dr. Shirlee Latch on 11/21/2022.  Last TTE 2023 with LVEF 25-30%.  Home regimen includes amiodarone 200 mg p.o. daily,  carvedilol 6.25 mg  p.o. twice daily, Entresto 49-51 mg p.o. twice daily, spironolactone 25 mg p.o. daily, furosemide 60 mg p.o. daily. -- Continue amiodarone 2 mg p.o. daily -- Continue carvedilol 6.25 mg p.o. twice daily -- Holding home spironolactone, furosemide and Entresto due to AKI as above -- INR subtherapeutic 2.0, pharmacy consulted for Coumadin dosing/monitoring with goal 2.5-3.5 with mechanical AVR -- Lovenox bridge until INR therapeutic -- Strict I's and O's and daily weights -- INR daily, 2.1 today  Bipolar disorder Anxiety Home regimen includes Lamictal 100 mg p.o. daily, 200 mg p.o. nightly, Effexor 75 mg p.o. daily -- Continue Lamictal 100 mg p.o. daily, 20 mg p.o. nightly -- Lamictal level: Pending  Hypothyroidism TSH 1.313, free T41.40. -- Continue levothyroxine 50 mcg p.o. daily  History of CVA History of SDH, multiple falls requiring craniotomy -- Fall precautions -- PT/OT following -- Anticipate need to return to SNF, TOC consulted  Cognitive impairment/early dementia --Delirium precautions --Get up during the day --Encourage a familiar face to remain present throughout the day --Keep blinds open and lights on during daylight hours --Minimize the use of opioids/benzodiazepines  Wound left ankle Patient reports wound present for unclear amount of time, "years" and reports drainage from site.  Has not been evaluated previously.  Patient is afebrile without leukocytosis.  Unable to express any purulent material, no fluctuance, very minimal if no erythema surrounding site.  Given her renal function unable to obtain contrasted study.  Left ankle x-ray with mild edema, no concern for osteomyelitis. Cleanse with Vashe pure hypochlorous acid by way of a 10 minutes soak to the wound and surrounding skin followed by pat dry and placement of a size appropriate piece of silver hydrofiber to the wound topped with a piece of dry gauze and secured with a silicone foam for atraumatic dressing  changes. Daily care while in the hospital is appropriate and may be reduced in frequency to 3 times weekly upon discharge if desired. Heels are to be floated while in bed.  -- Seen by wound care RN.  Obesity Complicates all facets of care   DVT prophylaxis:  warfarin (COUMADIN) tablet 3 mg    Code Status: DNR Family Communication: No family present at bedside this morning  Disposition Plan:  Level of care: Med-Surg Status is: Inpatient Remains inpatient appropriate because: Pending MRI brain      Consultants:  None  Procedures:  none  Antimicrobials:  None   Subjective: Patient seen examined bedside, resting calmly.  Sitting in bedside chair.  Eating breakfast.  No specific complaints this morning.  Pending MRI brain. Continues on IV fluids, creatinine improved.  No other specific questions or concerns at this time.  Denies headache, no dizziness, no chest pain, no palpitations, no shortness of breath, no abdominal pain, no fever/chills/night sweats, no nausea/vomiting/diarrhea, no fatigue, no paresthesias.  No acute events overnight per nursing.  Objective: Vitals:   01/10/23 2326 01/11/23 0358 01/11/23 0553 01/11/23 0735  BP: (!) 130/50 124/64  112/60  Pulse: (!) 58 (!) 58  71  Resp: 18 19  18   Temp: 97.6 F (36.4 C) 98.1 F (36.7 C)  99 F (37.2 C)  TempSrc: Oral Oral  Oral  SpO2: 96% 94%  98%  Weight:   111.6 kg   Height:        Intake/Output Summary (Last 24 hours) at 01/11/2023 1100 Last data filed at 01/11/2023 0739 Gross per 24 hour  Intake 240 ml  Output 1700 ml  Net -  1460 ml   Filed Weights   01/09/23 2155 01/10/23 0408 01/11/23 0553  Weight: 110.8 kg 110.8 kg 111.6 kg    Examination:  Physical Exam: GEN: NAD, alert and oriented x 3, chronically ill in appearance, obese HEENT: NCAT, PERRL, EOMI, sclera clear, MMM PULM: CTAB w/o wheezes/crackles, normal respiratory effort, on room air CV: RRR w/ + mechanical click, no M/G/R GI: abd soft,  NTND, NABS, no R/G/M MSK: no peripheral edema, decreased muscle strength bilateral lower extremities 2/5 bilaterally, no paresthesias NEURO: CN II-XII intact, no focal deficits, sensation to light touch intact, muscle strength bilateral lower extremities as above PSYCH: normal mood/affect Integumentary: wound noted left lateral malleolus, No other, concerning rashes/lesions/wounds noted on exposed skin surfaces.    Data Reviewed: I have personally reviewed following labs and imaging studies  CBC: Recent Labs  Lab 01/09/23 1309 01/09/23 1339 01/10/23 0625  WBC 7.0  --  8.4  NEUTROABS 5.0  --   --   HGB 10.7* 11.9* 11.4*  HCT 34.4* 35.0* 36.9  MCV 101.8*  --  101.4*  PLT 217  --  221   Basic Metabolic Panel: Recent Labs  Lab 01/09/23 1309 01/09/23 1339 01/09/23 2015 01/10/23 0625 01/11/23 0447  NA 141 139 138 140 137  K 6.1* 4.7 4.1 4.2 3.9  CL 100 103 100 102 103  CO2 26  --  30 29 23   GLUCOSE 93 96 137* 113* 96  BUN 52* 48* 46* 43* 30*  CREATININE 2.68* 2.90* 2.53* 2.35* 1.67*  CALCIUM 8.9  --  8.8* 9.1 8.8*   GFR: Estimated Creatinine Clearance: 39.9 mL/min (A) (by C-G formula based on SCr of 1.67 mg/dL (H)). Liver Function Tests: Recent Labs  Lab 01/09/23 1309 01/09/23 2015 01/10/23 0625  AST 46* 31 32  ALT 18 18 19   ALKPHOS 23* 21* 26*  BILITOT 0.7 0.8 0.6  PROT 6.6 6.7 6.7  ALBUMIN 3.9 3.6 3.8   No results for input(s): "LIPASE", "AMYLASE" in the last 168 hours. No results for input(s): "AMMONIA" in the last 168 hours. Coagulation Profile: Recent Labs  Lab 01/09/23 1309 01/10/23 0625 01/11/23 0447  INR 2.0* 2.0* 2.1*   Cardiac Enzymes: No results for input(s): "CKTOTAL", "CKMB", "CKMBINDEX", "TROPONINI" in the last 168 hours. BNP (last 3 results) No results for input(s): "PROBNP" in the last 8760 hours. HbA1C: Recent Labs    01/09/23 2015  HGBA1C 5.2   CBG: Recent Labs  Lab 01/10/23 0606 01/10/23 1134 01/10/23 1617 01/10/23 2112  01/11/23 0610  GLUCAP 108* 171* 95 114* 100*   Lipid Profile: No results for input(s): "CHOL", "HDL", "LDLCALC", "TRIG", "CHOLHDL", "LDLDIRECT" in the last 72 hours. Thyroid Function Tests: Recent Labs    01/10/23 1017  TSH 1.313  FREET4 1.40*   Anemia Panel: No results for input(s): "VITAMINB12", "FOLATE", "FERRITIN", "TIBC", "IRON", "RETICCTPCT" in the last 72 hours. Sepsis Labs: No results for input(s): "PROCALCITON", "LATICACIDVEN" in the last 168 hours.  No results found for this or any previous visit (from the past 240 hour(s)).       Radiology Studies: DG Ankle 2 Views Left  Result Date: 01/10/2023 CLINICAL DATA:  Cellulitis and wound of the ankle. EXAM: LEFT ANKLE - 2 VIEW COMPARISON:  None Available. FINDINGS: There is no evidence of fracture, dislocation, or joint effusion. Plantar and posterior calcaneal enthesophytes are noted. Degenerative changes are seen in the dorsal midfoot. No focal osseous demineralization to suggest osteomyelitis. There is soft tissue swelling around the ankle. IMPRESSION: Soft  tissue swelling around the ankle without radiographic evidence of osteomyelitis. Electronically Signed   By: Romona Curls M.D.   On: 01/10/2023 17:44   CT Head Wo Contrast  Result Date: 01/09/2023 CLINICAL DATA:  Neuro deficit, acute, stroke suspected EXAM: CT HEAD WITHOUT CONTRAST TECHNIQUE: Contiguous axial images were obtained from the base of the skull through the vertex without intravenous contrast. RADIATION DOSE REDUCTION: This exam was performed according to the departmental dose-optimization program which includes automated exposure control, adjustment of the mA and/or kV according to patient size and/or use of iterative reconstruction technique. COMPARISON:  CT head 06/13/2020. FINDINGS: Brain: No convincing evidence of acute hemorrhage. No evidence of acute large vascular territory infarction, hydrocephalus, extra-axial collection or mass lesion/mass effect. Mild  dural thickening along the left cerebral convexity with areas of mineralization, favored postsurgical given overlying craniotomy. Vascular: No hyperdense vessel.  Calcific atherosclerosis. Skull: No acute fracture.  Left craniotomy. Sinuses/Orbits: Clear sinuses per neck overall findings. IMPRESSION: 1. No evidence of acute intracranial abnormality. 2. Mild dural thickening along the left cerebral convexity with areas of mineralization, favored postsurgical given overlying craniotomy. Electronically Signed   By: Feliberto Harts M.D.   On: 01/09/2023 15:17        Scheduled Meds:  amiodarone  200 mg Oral Daily   carvedilol  6.25 mg Oral BID WC   dapagliflozin propanediol  10 mg Oral Daily   enoxaparin (LOVENOX) injection  100 mg Subcutaneous Q12H   fenofibrate  160 mg Oral Daily   fluticasone  1 spray Each Nare BID   insulin aspart  0-9 Units Subcutaneous TID WC   lamoTRIgine  100 mg Oral Daily   lamoTRIgine  200 mg Oral QHS   levothyroxine  50 mcg Oral QAC breakfast   melatonin  10 mg Oral QHS   pantoprazole  20 mg Oral QODAY   rosuvastatin  40 mg Oral Daily   sodium chloride flush  3 mL Intravenous Q12H   traZODone  150 mg Oral QHS   venlafaxine  75 mg Oral Daily   warfarin  3 mg Oral ONCE-1600   Warfarin - Pharmacist Dosing Inpatient   Does not apply q1600   Continuous Infusions:  sodium chloride 75 mL/hr at 01/10/23 0958     LOS: 1 day    Time spent: 52 minutes spent on chart review, discussion with nursing staff, consultants, updating family and interview/physical exam; more than 50% of that time was spent in counseling and/or coordination of care.    Alvira Philips [place], DO [facility] Available via Epic secure chat 7am-7pm After these hours, please refer to coverage provider listed on [email or web address] 01/11/2023, 11:00 AM

## 2023-01-12 ENCOUNTER — Inpatient Hospital Stay (HOSPITAL_COMMUNITY): Payer: PPO

## 2023-01-12 DIAGNOSIS — R29818 Other symptoms and signs involving the nervous system: Secondary | ICD-10-CM | POA: Diagnosis not present

## 2023-01-12 LAB — LAMOTRIGINE LEVEL: Lamotrigine Lvl: 8.2 ug/mL (ref 2.0–20.0)

## 2023-01-12 LAB — GLUCOSE, CAPILLARY
Glucose-Capillary: 102 mg/dL — ABNORMAL HIGH (ref 70–99)
Glucose-Capillary: 82 mg/dL (ref 70–99)
Glucose-Capillary: 132 mg/dL — ABNORMAL HIGH (ref 70–99)
Glucose-Capillary: 109 mg/dL — ABNORMAL HIGH (ref 70–99)

## 2023-01-12 LAB — BASIC METABOLIC PANEL
Anion gap: 7 (ref 5–15)
BUN: 27 mg/dL — ABNORMAL HIGH (ref 8–23)
CO2: 25 mmol/L (ref 22–32)
Calcium: 9.3 mg/dL (ref 8.9–10.3)
Chloride: 107 mmol/L (ref 98–111)
Creatinine, Ser: 1.49 mg/dL — ABNORMAL HIGH (ref 0.44–1.00)
GFR, Estimated: 37 mL/min — ABNORMAL LOW (ref >60)
Glucose, Bld: 96 mg/dL (ref 70–99)
Potassium: 4 mmol/L (ref 3.5–5.1)
Sodium: 139 mmol/L (ref 135–145)

## 2023-01-12 LAB — CBC
HCT: 33.4 % — ABNORMAL LOW (ref 36.0–46.0)
Hemoglobin: 10.8 g/dL — ABNORMAL LOW (ref 12.0–15.0)
MCH: 31.5 pg (ref 26.0–34.0)
MCHC: 32.3 g/dL (ref 30.0–36.0)
MCV: 97.4 fL (ref 80.0–100.0)
Platelets: 222 10*3/uL (ref 150–400)
RBC: 3.43 MIL/uL — ABNORMAL LOW (ref 3.87–5.11)
RDW: 13.6 % (ref 11.5–15.5)
WBC: 5.7 10*3/uL (ref 4.0–10.5)
nRBC: 0 % (ref 0.0–0.2)

## 2023-01-12 LAB — PROTIME-INR
INR: 2.3 — ABNORMAL HIGH (ref 0.8–1.2)
Prothrombin Time: 25.2 s — ABNORMAL HIGH (ref 11.4–15.2)

## 2023-01-12 MED ORDER — SACUBITRIL-VALSARTAN 24-26 MG PO TABS
1.0000 | ORAL_TABLET | Freq: Two times a day (BID) | ORAL | Status: DC
  Administered 2023-01-12 – 2023-01-13 (×3): 1 via ORAL
  Filled 2023-01-12 (×3): qty 1

## 2023-01-12 MED ORDER — WARFARIN SODIUM 2 MG PO TABS
2.0000 mg | ORAL_TABLET | Freq: Once | ORAL | Status: AC
  Administered 2023-01-12: 2 mg via ORAL
  Filled 2023-01-12: qty 1

## 2023-01-12 NOTE — Plan of Care (Signed)

## 2023-01-12 NOTE — Progress Notes (Addendum)
PROGRESS NOTE    [name]  JXB:[id number] DOB: 12-May-1951 DOA: 01/09/2023 PCP: Karna Dupes, MD    Brief Narrative:   [name] is a 72 y.o. female with past medical history significant for chronic combined systolic/diastolic congestive heart failure (EF 25-30% 2023), HTN, HLD, type 2 diabetes mellitus, hypothyroidism, CVA, Hx SDH with multiple falls s/p craniotomy, Hx ascending aortic aneurysm s/p resection/grafting and placement of Saint Jude mechanical AVR, tachy/bradycardia syndrome s/p PPM, persistent atrial fibrillation on Coumadin, history of QT prolongation, bipolar disorder, anxiety, chronic low back pain, debilitation (wheelchair bound per cardiology Dr. Shirlee Latch note 10/2022), carotid artery disease, CKD stage IIIa, memory impairment/dementia, obesity who presented to [facility] ED on 8/16 from [facility] after she was noted to have increasing weakness over the last day.  Now requiring increasing assistance for activities including getting out of bed, staff noted weakness greater on the right than left.  Patient denied fever, no chills, no chest pain, no shortness of breath, no abdominal pain, no nausea/vomiting/diarrhea.  Patient was recently seen in the outpatient neurosurgery clinic, Dr. Dutch Quint who ordered MR L-spine.  Results are still pending; and has yet to be seen in follow-up.  In the ED, temperature 97.6 F, HR 60, RR 18, BP 122/58, SpO2 99% on room air.  WBC 7.0, hemoglobin 10.7, platelet count 217.  Sodium 141, potassium 6.1, chloride 100, CO2 26, glucose 93, BUN 52, creatinine 2.68.  AST 46, ALT 18, total bilirubin 0.7.  Hemoglobin A1c 5.2, urinalysis unrevealing.  EtOH level less than 10.  UDS positive for opiates.  CT head without contrast with no evidence of acute intracranial abnormality, mild dural thickening left cerebral convexity favored postsurgical given overlying craniotomy.  Patient received 500 mL IV fluid bolus in the ED.  TRH consulted for  admission for further evaluation management of concern for progressive weakness.  Assessment & Plan:   Acute renal failure on CKD stage IIIa Creatinine elevated 2.68 on admission with a baseline 1.7.  Etiology likely secondary to prerenal azotemia/dehydration in the setting of home diuretic/Entresto use -- Cr 2.68>>2.35>1.67>1.49 -- Holding home furosemide, spironolactone -- Restart home Entresto at lower dose 24-26 mg p.o. twice daily -- Discontinue IV fluids -- Monitor urine output -- BMP daily  Hyperkalemia: Resolved Potassium elevated 6.1 admission, likely secondary to acute on chronic renal failure.  Supported with IV fluid hydration with resolution. -- BMP daily  Generalized weakness Chronic low back pain Chronic debilitation Patient presenting from SNF with progressive weakness, staff report needing more assistance with ADLs, including to get out of the bed.  No paresthesias.  Although per outpatient cardiology note, Dr. Shirlee Latch on 10/2022 notes that she is wheelchair-bound.  Has recently seen neurosurgery, Dr. Dutch Quint outpatient with MR L-spine on 8/12; with results of multilevel lumbar spondylosis most pronounced at L4-5 w/ moderate right subarticular recess stenosis and mild right foraminal stenosis, no significant canal stenosis at any level.  Has yet to be seen in follow-up neurosurgery clinic.  CT head without contrast with no acute findings. -- MRI brain without contrast: Pending -- PT/OT evaluation: recommend return to SNF -- Fall precautions, supportive care  Chronic combined systolic/diastolic congestive heart failure Essential hypertension Hx ascending aortic aneurysm s/p resection/grafting and placement of Saint Jude mechanical AVR Tachybradycardia syndrome s/p PPM Persistent atrial fibrillation on Coumadin Patient follows with cardiology/EP Dr. Shirlee Latch and Dr. Katrinka Blazing outpatient.  Last seen by Dr. Shirlee Latch on 11/21/2022.  Last TTE 2023 with LVEF 25-30%.  Home  regimen includes  amiodarone 200 mg p.o. daily, carvedilol 6.25 mg p.o. twice daily, Entresto 49-51 mg p.o. twice daily, spironolactone 25 mg p.o. daily, furosemide 60 mg p.o. daily. -- Continue amiodarone 2 mg p.o. daily -- Continue carvedilol 6.25 mg p.o. twice daily --Restart Entresto at lower dose 24-26 mg p.o. twice daily today -- Holding home spironolactone, furosemide due to AKI as above -- INR subtherapeutic 2.0, pharmacy consulted for Coumadin dosing/monitoring with goal 2.5-3.5 with mechanical AVR -- Lovenox bridge until INR therapeutic -- Strict I's and O's and daily weights -- INR daily, 2.3 today  Bipolar disorder Anxiety Home regimen includes Lamictal 100 mg p.o. daily, 200 mg p.o. nightly, Effexor 75 mg p.o. daily -- Continue Lamictal 100 mg p.o. daily, 20 mg p.o. nightly -- Lamictal level: Pending  Hypothyroidism TSH 1.313, free T41.40. -- Continue levothyroxine 50 mcg p.o. daily  History of CVA History of SDH, multiple falls requiring craniotomy -- Fall precautions -- PT/OT following -- Anticipate need to return to SNF, TOC consulted  Cognitive impairment/early dementia --Delirium precautions --Get up during the day --Encourage a familiar face to remain present throughout the day --Keep blinds open and lights on during daylight hours --Minimize the use of opioids/benzodiazepines  Wound left ankle Patient reports wound present for unclear amount of time, "years" and reports drainage from site.  Has not been evaluated previously.  Patient is afebrile without leukocytosis.  Unable to express any purulent material, no fluctuance, very minimal if no erythema surrounding site.  Given her renal function unable to obtain contrasted study.  Left ankle x-ray with mild edema, no concern for osteomyelitis. Cleanse with Vashe pure hypochlorous acid by way of a 10 minutes soak to the wound and surrounding skin followed by pat dry and placement of a size appropriate piece of silver hydrofiber to  the wound topped with a piece of dry gauze and secured with a silicone foam for atraumatic dressing changes. Daily care while in the hospital is appropriate and may be reduced in frequency to 3 times weekly upon discharge if desired. Heels are to be floated while in bed.  -- Seen by wound care RN.  Obesity Complicates all facets of care   DVT prophylaxis:     Code Status: DNR Family Communication: No family present at bedside this morning  Disposition Plan:  Level of care: Med-Surg Status is: Inpatient Remains inpatient appropriate because: Pending MRI brain      Consultants:  None  Procedures:  none  Antimicrobials:  None   Subjective: Patient seen examined bedside, resting calmly.  Lying in bed.  No specific complaints this morning.  Pending MRI brain later today.  Discontinue IV fluids, discussed will restart Entresto at a lower dose today.  Also updated likely to return to SNF tomorrow if no significant findings on MRI.  Denies headache, no dizziness, no chest pain, no palpitations, no shortness of breath, no abdominal pain, no fever/chills/night sweats, no nausea/vomiting/diarrhea, no fatigue, no paresthesias.  No acute events overnight per nursing.  Objective: Vitals:   01/11/23 2335 01/12/23 0324 01/12/23 0539 01/12/23 0812  BP: 136/61 119/70  (!) 151/64  Pulse: 78 78  74  Resp: 18 18  16   Temp: 98.8 F (37.1 C) 98.6 F (37 C)  98.6 F (37 C)  TempSrc: Oral Oral  Oral  SpO2: 97% 96%  97%  Weight:   109 kg   Height:        Intake/Output Summary (Last 24 hours) at 01/12/2023 1028 Last  data filed at 01/12/2023 0326 Gross per 24 hour  Intake --  Output 2100 ml  Net -2100 ml   Filed Weights   01/10/23 0408 01/11/23 0553 01/12/23 0539  Weight: 110.8 kg 111.6 kg 109 kg    Examination:  Physical Exam: GEN: NAD, alert and oriented x 3, chronically ill in appearance, obese HEENT: NCAT, PERRL, EOMI, sclera clear, MMM PULM: CTAB w/o wheezes/crackles, normal  respiratory effort, on room air CV: RRR w/ + mechanical click, no M/G/R GI: abd soft, NTND, NABS, no R/G/M MSK: no peripheral edema, decreased muscle strength bilateral lower extremities 2/5 bilaterally, no paresthesias NEURO: CN II-XII intact, no focal deficits, sensation to light touch intact, muscle strength bilateral lower extremities as above PSYCH: normal mood/affect Integumentary: wound noted left lateral malleolus, No other, concerning rashes/lesions/wounds noted on exposed skin surfaces.    Data Reviewed: I have personally reviewed following labs and imaging studies  CBC: Recent Labs  Lab 01/09/23 1309 01/09/23 1339 01/10/23 0625 01/12/23 0452  WBC 7.0  --  8.4 5.7  NEUTROABS 5.0  --   --   --   HGB 10.7* 11.9* 11.4* 10.8*  HCT 34.4* 35.0* 36.9 33.4*  MCV 101.8*  --  101.4* 97.4  PLT 217  --  221 222   Basic Metabolic Panel: Recent Labs  Lab 01/09/23 1309 01/09/23 1339 01/09/23 2015 01/10/23 0625 01/11/23 0447 01/12/23 0452  NA 141 139 138 140 137 139  K 6.1* 4.7 4.1 4.2 3.9 4.0  CL 100 103 100 102 103 107  CO2 26  --  30 29 23 25   GLUCOSE 93 96 137* 113* 96 96  BUN 52* 48* 46* 43* 30* 27*  CREATININE 2.68* 2.90* 2.53* 2.35* 1.67* 1.49*  CALCIUM 8.9  --  8.8* 9.1 8.8* 9.3   GFR: Estimated Creatinine Clearance: 44.1 mL/min (A) (by C-G formula based on SCr of 1.49 mg/dL (H)). Liver Function Tests: Recent Labs  Lab 01/09/23 1309 01/09/23 2015 01/10/23 0625  AST 46* 31 32  ALT 18 18 19   ALKPHOS 23* 21* 26*  BILITOT 0.7 0.8 0.6  PROT 6.6 6.7 6.7  ALBUMIN 3.9 3.6 3.8   No results for input(s): "LIPASE", "AMYLASE" in the last 168 hours. No results for input(s): "AMMONIA" in the last 168 hours. Coagulation Profile: Recent Labs  Lab 01/09/23 1309 01/10/23 0625 01/11/23 0447 01/12/23 0452  INR 2.0* 2.0* 2.1* 2.3*   Cardiac Enzymes: No results for input(s): "CKTOTAL", "CKMB", "CKMBINDEX", "TROPONINI" in the last 168 hours. BNP (last 3 results) No  results for input(s): "PROBNP" in the last 8760 hours. HbA1C: Recent Labs    01/09/23 2015  HGBA1C 5.2   CBG: Recent Labs  Lab 01/11/23 0610 01/11/23 1144 01/11/23 1606 01/11/23 2133 01/12/23 0615  GLUCAP 100* 127* 115* 142* 102*   Lipid Profile: No results for input(s): "CHOL", "HDL", "LDLCALC", "TRIG", "CHOLHDL", "LDLDIRECT" in the last 72 hours. Thyroid Function Tests: Recent Labs    01/10/23 1017  TSH 1.313  FREET4 1.40*   Anemia Panel: No results for input(s): "VITAMINB12", "FOLATE", "FERRITIN", "TIBC", "IRON", "RETICCTPCT" in the last 72 hours. Sepsis Labs: No results for input(s): "PROCALCITON", "LATICACIDVEN" in the last 168 hours.  No results found for this or any previous visit (from the past 240 hour(s)).       Radiology Studies: DG Ankle 2 Views Left  Result Date: 01/10/2023 CLINICAL DATA:  Cellulitis and wound of the ankle. EXAM: LEFT ANKLE - 2 VIEW COMPARISON:  None Available. FINDINGS: There  is no evidence of fracture, dislocation, or joint effusion. Plantar and posterior calcaneal enthesophytes are noted. Degenerative changes are seen in the dorsal midfoot. No focal osseous demineralization to suggest osteomyelitis. There is soft tissue swelling around the ankle. IMPRESSION: Soft tissue swelling around the ankle without radiographic evidence of osteomyelitis. Electronically Signed   By: Romona Curls M.D.   On: 01/10/2023 17:44        Scheduled Meds:  amiodarone  200 mg Oral Daily   carvedilol  6.25 mg Oral BID WC   dapagliflozin propanediol  10 mg Oral Daily   enoxaparin (LOVENOX) injection  100 mg Subcutaneous Q12H   fenofibrate  160 mg Oral Daily   fluticasone  1 spray Each Nare BID   insulin aspart  0-9 Units Subcutaneous TID WC   lamoTRIgine  100 mg Oral Daily   lamoTRIgine  200 mg Oral QHS   levothyroxine  50 mcg Oral QAC breakfast   melatonin  10 mg Oral QHS   pantoprazole  20 mg Oral QODAY   rosuvastatin  40 mg Oral Daily   sodium  chloride flush  3 mL Intravenous Q12H   traZODone  150 mg Oral QHS   venlafaxine  75 mg Oral Daily   Warfarin - Pharmacist Dosing Inpatient   Does not apply q1600   Continuous Infusions:     LOS: 2 days    Time spent: 52 minutes spent on chart review, discussion with nursing staff, consultants, updating family and interview/physical exam; more than 50% of that time was spent in counseling and/or coordination of care.    Alvira Philips [place], DO [facility] Available via Epic secure chat 7am-7pm After these hours, please refer to coverage provider listed on [email or web address] 01/12/2023, 10:28 AM

## 2023-01-12 NOTE — Progress Notes (Signed)
Occupational Therapy Treatment Patient Details Name: [name] MRN: [id number] DOB: 1950/12/09 Today's Date: 01/12/2023   History of present illness 72 y.o. female presented to [facility] ED on 8/16 from [facility] after she was noted to have increasing weakness over the last day. PMHx: chronic combined systolic/diastolic congestive heart failure (EF 25-30% 2023), HTN, HLD, type 2 diabetes mellitus, hypothyroidism, CVA, Hx SDH with multiple falls s/p craniotomy, Hx ascending aortic aneurysm s/p resection/grafting and placement of Saint Jude mechanical AVR, tachy/bradycardia syndrome s/p PPM, persistent atrial fibrillation on Coumadin, history of QT prolongation, bipolar disorder, anxiety, chronic low back pain, debilitation, carotid artery disease, CKD stage IIIa, memory impairment/dementia, obesity.   OT comments  Patient demonstrated good gains this treatment session. Patient able to transfer from EOB to recliner with RW and min assist. Patient able to perform self care tasks seated and standing at sink with no episodes of knee buckling while standing at sink. Patient with limited standing tolerance but able to stand with one extremity support. Patient able to locate items at sink without verbal cues. Discharge recommendations continue to be appropriate. Acute OT to continue to follow.       If plan is discharge home, recommend the following:  A lot of help with walking and/or transfers;A lot of help with bathing/dressing/bathroom;Direct supervision/assist for medications management;Direct supervision/assist for financial management;Assist for transportation;Help with stairs or ramp for entrance;Supervision due to cognitive status   Equipment Recommendations  None recommended by OT (Patient has all needed DME)    Recommendations for Other Services      Precautions / Restrictions Precautions Precautions: Fall Restrictions Weight Bearing Restrictions: No       Mobility  Bed Mobility Overal bed mobility: Needs Assistance Bed Mobility: Rolling, Sidelying to Sit Rolling: Contact guard assist, Used rails Sidelying to sit: Min assist, HOB elevated, Used rails       General bed mobility comments: assistance with trunk and scooting to EOB    Transfers Overall transfer level: Needs assistance Equipment used: Rolling walker (2 wheels) Transfers: Sit to/from Stand, Bed to chair/wheelchair/BSC Sit to Stand: Min assist     Step pivot transfers: Min assist     General transfer comment: increased time, cues for safety and for hand placement     Balance Overall balance assessment: Needs assistance, History of Falls Sitting-balance support: No upper extremity supported, Feet supported Sitting balance-Leahy Scale: Fair Sitting balance - Comments: able to maintain balance on EOB   Standing balance support: Single extremity supported, Bilateral upper extremity supported, During functional activity Standing balance-Leahy Scale: Poor Standing balance comment: one extremity support when standing at sink                           ADL either performed or assessed with clinical judgement   ADL Overall ADL's : Needs assistance/impaired     Grooming: Wash/dry hands;Wash/dry face;Oral care;Set up;Sitting   Upper Body Bathing: Supervision/ safety;Sitting   Lower Body Bathing: Moderate assistance;Sit to/from stand Lower Body Bathing Details (indicate cue type and reason): stood for peri area bathing Upper Body Dressing : Minimal assistance;Sitting   Lower Body Dressing: Moderate assistance;Sitting/lateral leans Lower Body Dressing Details (indicate cue type and reason): to donn socks in long sitting               General ADL Comments: stood at sink for grooming and peri area bathing with no knee buckling    Extremity/Trunk Assessment  Vision       Perception     Praxis      Cognition Arousal: Alert Behavior During  Therapy: WFL for tasks assessed/performed Overall Cognitive Status: Impaired/Different from baseline Area of Impairment: Orientation, Attention, Memory, Following commands, Safety/judgement, Awareness, Problem solving                 Orientation Level: Situation Current Attention Level: Sustained Memory: Decreased recall of precautions, Decreased short-term memory Following Commands: Follows one step commands with increased time Safety/Judgement: Decreased awareness of safety, Decreased awareness of deficits Awareness: Emergent Problem Solving: Slow processing, Difficulty sequencing, Requires verbal cues, Requires tactile cues General Comments: followed commands with increased time and required cues for safety        Exercises      Shoulder Instructions       General Comments      Pertinent Vitals/ Pain       Pain Assessment Pain Assessment: Faces Faces Pain Scale: Hurts little more Pain Location: back and right knee Pain Descriptors / Indicators: Aching, Constant Pain Intervention(s): Limited activity within patient's tolerance, Monitored during session, Repositioned  Home Living                                          Prior Functioning/Environment              Frequency  Min 1X/week        Progress Toward Goals  OT Goals(current goals can now be found in the care plan section)  Progress towards OT goals: Progressing toward goals  Acute Rehab OT Goals Patient Stated Goal: get stronger OT Goal Formulation: With patient Time For Goal Achievement: 01/24/23 Potential to Achieve Goals: Fair ADL Goals Pt Will Perform Lower Body Bathing: with min assist;sitting/lateral leans;sit to/from stand Pt Will Perform Lower Body Dressing: with min assist;sitting/lateral leans;sit to/from stand Pt Will Transfer to Toilet: with min assist;stand pivot transfer;bedside commode Pt Will Perform Toileting - Clothing Manipulation and hygiene: with min  assist;sitting/lateral leans;sit to/from stand Additional ADL Goal #1: With visual strategies employed, patient will endorse decrease in double vision to promote increased independence in ADLs and IADLs. Additional ADL Goal #2: Patient will be able to complete 2-3 step task without cues to reorient in order to increase overall cognitive awareness.  Plan      Co-evaluation                 AM-PAC OT "6 Clicks" Daily Activity     Outcome Measure   Help from another person eating meals?: None Help from another person taking care of personal grooming?: None Help from another person toileting, which includes using toliet, bedpan, or urinal?: A Lot Help from another person bathing (including washing, rinsing, drying)?: A Lot Help from another person to put on and taking off regular upper body clothing?: A Little Help from another person to put on and taking off regular lower body clothing?: A Lot 6 Click Score: 17    End of Session Equipment Utilized During Treatment: Gait belt;Rolling walker (2 wheels)  OT Visit Diagnosis: Unsteadiness on feet (R26.81);Other abnormalities of gait and mobility (R26.89);Muscle weakness (generalized) (M62.81);Other symptoms and signs involving cognitive function   Activity Tolerance Patient tolerated treatment well   Patient Left in chair;with call bell/phone within reach;with chair alarm set   Nurse Communication Mobility status  Time: 7829-5621 OT Time Calculation (min): 46 min  Charges: OT General Charges $OT Visit: 1 Visit OT Treatments $Self Care/Home Management : 38-52 mins  Alfonse Flavors, OTA Acute Rehabilitation Services  Office [phone]   Dewain Penning 01/12/2023, 12:15 PM

## 2023-01-12 NOTE — Progress Notes (Signed)
ANTICOAGULATION CONSULT NOTE  Pharmacy Consult for warfarin Indication: atrial fibrillation and mechanical aortic valve  Allergies  Allergen Reactions   Mucinex [Guaifenesin Er] Other (See Comments)    Severe headaches    Keflex [Cephalexin] Other (See Comments)    Headaches Dizziness    Mucinex [Guaifenesin Er] Other (See Comments)    Severe headaches   Sulfa Antibiotics Other (See Comments)    Headaches    Sulfonamide Derivatives Other (See Comments)    Headaches     Patient Measurements: Height: 5\' 8"  (172.7 cm) Weight: 109 kg (240 lb 4.8 oz) IBW/kg (Calculated) : 63.9  Vital Signs: Temp: 98.5 F (36.9 C) (08/19 1100) Temp Source: Oral (08/19 1100) BP: 122/71 (08/19 1100) Pulse Rate: 79 (08/19 1100)  Labs: Recent Labs    01/10/23 0625 01/11/23 0447 01/12/23 0452  HGB 11.4*  --  10.8*  HCT 36.9  --  33.4*  PLT 221  --  222  LABPROT 22.6* 24.0* 25.2*  INR 2.0* 2.1* 2.3*  CREATININE 2.35* 1.67* 1.49*    Estimated Creatinine Clearance: 44.1 mL/min (A) (by C-G formula based on SCr of 1.49 mg/dL (H)).  Assessment: 72 yo female on warfarin PTA for afib and mechanical aortic valve. Was taking 2 mg daily, though INR returned 4.0 on Monday. Warfarin was on hold from Monday-Friday for elevated INR and was to be restarted on 2 mg T/Th/Sat and 1.5 mg on all other days.   INR 2.3, subtherapeutic. No signs or symptoms of bleeding, CBC stable.  Per Dr. [place], will bridge with therapeutic Lovenox until therapeutic on warfarin.   Goal of Therapy:  INR 2.5-3.5 Monitor platelets by anticoagulation protocol: Yes   Plan:  Warfarin 2 mg PO tonight  Continue Lovenox 100 mg SQ q12h until INR at goal Monitor CBC, INR, and signs/symptoms of bleeding  Thank you for involving pharmacy in this patient's care.  Loura Back, PharmD, BCPS Clinical Pharmacist Clinical phone for 01/12/2023 is [phone] 01/12/2023 1:56 PM

## 2023-01-13 DIAGNOSIS — R29818 Other symptoms and signs involving the nervous system: Secondary | ICD-10-CM | POA: Diagnosis not present

## 2023-01-13 LAB — BASIC METABOLIC PANEL
Anion gap: 8 (ref 5–15)
BUN: 25 mg/dL — ABNORMAL HIGH (ref 8–23)
CO2: 24 mmol/L (ref 22–32)
Calcium: 9.2 mg/dL (ref 8.9–10.3)
Chloride: 108 mmol/L (ref 98–111)
Creatinine, Ser: 1.6 mg/dL — ABNORMAL HIGH (ref 0.44–1.00)
GFR, Estimated: 34 mL/min — ABNORMAL LOW (ref >60)
Glucose, Bld: 101 mg/dL — ABNORMAL HIGH (ref 70–99)
Potassium: 3.8 mmol/L (ref 3.5–5.1)
Sodium: 140 mmol/L (ref 135–145)

## 2023-01-13 LAB — GLUCOSE, CAPILLARY
Glucose-Capillary: 103 mg/dL — ABNORMAL HIGH (ref 70–99)
Glucose-Capillary: 107 mg/dL — ABNORMAL HIGH (ref 70–99)

## 2023-01-13 LAB — PROTIME-INR
INR: 2.7 — ABNORMAL HIGH (ref 0.8–1.2)
Prothrombin Time: 29.2 s — ABNORMAL HIGH (ref 11.4–15.2)

## 2023-01-13 MED ORDER — TRAZODONE HCL 150 MG PO TABS: 150.0000 mg | ORAL_TABLET | Freq: Every day | ORAL | 0 refills | Status: DC

## 2023-01-13 MED ORDER — FUROSEMIDE 20 MG PO TABS: 20.0000 mg | ORAL_TABLET | Freq: Every day | ORAL | 0 refills | Status: DC

## 2023-01-13 MED ORDER — HYDROCODONE-ACETAMINOPHEN 5-325 MG PO TABS: 1.0000 | ORAL_TABLET | Freq: Four times a day (QID) | ORAL | 0 refills | Status: DC

## 2023-01-13 MED ORDER — SPIRONOLACTONE 25 MG PO TABS: 12.5000 mg | ORAL_TABLET | Freq: Every day | ORAL | 0 refills | Status: DC

## 2023-01-13 MED ORDER — SACUBITRIL-VALSARTAN 24-26 MG PO TABS: 1.0000 | ORAL_TABLET | Freq: Two times a day (BID) | ORAL | 0 refills | Status: AC

## 2023-01-13 NOTE — Progress Notes (Signed)
Physical Therapy Treatment Patient Details Name: [name] MRN: [id number] DOB: 03-06-1951 Today's Date: 01/13/2023   History of Present Illness 72 y.o. female presented to [facility] ED on 8/16 from [facility] after she was noted to have increasing weakness over the last day. PMHx: chronic combined systolic/diastolic congestive heart failure (EF 25-30% 2023), HTN, HLD, type 2 diabetes mellitus, hypothyroidism, CVA, Hx SDH with multiple falls s/p craniotomy, Hx ascending aortic aneurysm s/p resection/grafting and placement of [employer] mechanical AVR, tachy/bradycardia syndrome s/p PPM, persistent atrial fibrillation on Coumadin, history of QT prolongation, bipolar disorder, anxiety, chronic low back pain, debilitation, carotid artery disease, CKD stage IIIa, memory impairment/dementia, obesity.    PT Comments  Patient resting in recliner at start of session and agreeable to therapy visit. Pt eager to get to SNF for rehab. Session began with repeated sit<>stands for functional LE strengthening with min cues for technique and min assist for power up. Pt initiated gait training with RW and close chair follow for safety, min assist to guide walker and steady. Pt requesting to void bladder and stand step transfer completed chair>BSC. RN arrived to room for PTAR transfer to facility. Pt left in care of RN for transfer back to recliner.      If plan is discharge home, recommend the following: A little help with walking and/or transfers;A little help with bathing/dressing/bathroom;Assistance with cooking/housework;Direct supervision/assist for medications management;Assist for transportation;Help with stairs or ramp for entrance   Can travel by private vehicle     No  Equipment Recommendations  None recommended by PT    Recommendations for Other Services       Precautions / Restrictions Precautions Precautions: Fall Restrictions Weight Bearing Restrictions: No     Mobility   Bed Mobility               General bed mobility comments: pt OOB in recliner    Transfers Overall transfer level: Needs assistance Equipment used: Rolling walker (2 wheels) Transfers: Sit to/from Stand, Bed to chair/wheelchair/BSC Sit to Stand: Min assist   Step pivot transfers: Min assist       General transfer comment: cues for hand placement, min assist to rise and guide RW for turn recliner>BSC. pt completed repeat sit<>stands from chair (5x) for whole task practice.    Ambulation/Gait Ambulation/Gait assistance: Min assist Gait Distance (Feet): 8 Feet Assistive device: Rolling walker (2 wheels) Gait Pattern/deviations: Step-through pattern, Decreased step length - left, Decreased step length - right, Decreased stride length, Shuffle, Wide base of support, Trunk flexed Gait velocity: decr     General Gait Details: cues for proximity to RW, BOS slightly wide with short shuffled steps. no buckling, close chair follow for safety.   Stairs             Wheelchair Mobility     Tilt Bed    Modified Rankin (Stroke Patients Only)       Balance Overall balance assessment: Needs assistance, History of Falls Sitting-balance support: No upper extremity supported, Feet supported Sitting balance-Leahy Scale: Fair Sitting balance - Comments: able to maintain balance on EOB   Standing balance support: Single extremity supported, Bilateral upper extremity supported, During functional activity, Reliant on assistive device for balance Standing balance-Leahy Scale: Poor Standing balance comment: reliant on RW                            Cognition Arousal: Alert Behavior During Therapy: [facility]  for tasks assessed/performed Overall Cognitive Status: Impaired/Different from baseline Area of Impairment: Orientation, Attention, Memory, Following commands, Safety/judgement, Awareness, Problem solving                 Orientation Level: Situation Current  Attention Level: Sustained Memory: Decreased recall of precautions, Decreased short-term memory Following Commands: Follows one step commands with increased time Safety/Judgement: Decreased awareness of safety, Decreased awareness of deficits Awareness: Emergent Problem Solving: Slow processing, Difficulty sequencing, Requires verbal cues, Requires tactile cues General Comments: followed commands with increased time and required cues for safety        Exercises      General Comments        Pertinent Vitals/Pain Pain Assessment Pain Assessment: Faces Faces Pain Scale: Hurts little more Pain Location: back and right knee Pain Descriptors / Indicators: Aching, Constant Pain Intervention(s): Limited activity within patient's tolerance, Monitored during session, Repositioned    Home Living                          Prior Function            PT Goals (current goals can now be found in the care plan section) Acute Rehab PT Goals Patient Stated Goal: Get stronger, reduce pain PT Goal Formulation: With patient Time For Goal Achievement: 01/24/23 Potential to Achieve Goals: Fair Progress towards PT goals: Progressing toward goals    Frequency    Min 1X/week      PT Plan      Co-evaluation              AM-PAC PT "6 Clicks" Mobility   Outcome Measure  Help needed turning from your back to your side while in a flat bed without using bedrails?: A Little Help needed moving from lying on your back to sitting on the side of a flat bed without using bedrails?: A Little Help needed moving to and from a bed to a chair (including a wheelchair)?: A Little Help needed standing up from a chair using your arms (e.g., wheelchair or bedside chair)?: A Little Help needed to walk in hospital room?: A Little Help needed climbing 3-5 steps with a railing? : Total 6 Click Score: 16    End of Session Equipment Utilized During Treatment: Gait belt Activity Tolerance:  Patient tolerated treatment well Patient left: in chair;with call bell/phone within reach;with chair alarm set Nurse Communication: Mobility status PT Visit Diagnosis: Unsteadiness on feet (R26.81);Difficulty in walking, not elsewhere classified (R26.2);Muscle weakness (generalized) (M62.81);History of falling (Z91.81);Repeated falls (R29.6);Pain     Time: 1356-1430 PT Time Calculation (min) (ACUTE ONLY): 34 min  Charges:    $Gait Training: 8-22 mins $Therapeutic Activity: 8-22 mins PT General Charges $$ ACUTE PT VISIT: 1 Visit                     Wynn Maudlin, DPT Acute Rehabilitation Services Office [phone]  01/13/23 3:25 PM

## 2023-01-13 NOTE — Discharge Summary (Signed)
Physician Discharge Summary  [name] HYQ:[id number] DOB: [birth year] DOA: 01/09/2023  PCP: Karna Dupes, MD  Admit date: 01/09/2023 Discharge date: 01/13/2023  Admitted From: [facility] Disposition: [facility]  Recommendations for Outpatient Follow-up:  Follow up with PCP in 1-2 weeks Follow-up with cardiology, Dr. Shirlee Latch in 2-3 weeks Recommend referral to vascular surgery for evaluation of lower extremity wound Outpatient follow-up with neurosurgery, Dr. Dutch Quint as scheduled Decreased Entresto to 24-26 mg twice daily, spironolactone 12.5 mg p.o. daily, furosemide 20 mg p.o. daily due to worsening renal failure, dehydration Please obtain BMP in one week to reassess renal function Recommend monitoring daily weights  Discharge Condition: Stable CODE STATUS: DNR Diet recommendation: Heart healthy/consistent carb regular diet  History of present illness:  [name] is a 72 y.o. female with past medical history significant for chronic combined systolic/diastolic congestive heart failure (EF 25-30% 2023), HTN, HLD, type 2 diabetes mellitus, hypothyroidism, CVA, Hx SDH with multiple falls s/p craniotomy, Hx ascending aortic aneurysm s/p resection/grafting and placement of Saint Jude mechanical AVR, tachy/bradycardia syndrome s/p PPM, persistent atrial fibrillation on Coumadin, history of QT prolongation, bipolar disorder, anxiety, chronic low back pain, debilitation (wheelchair bound per cardiology Dr. Shirlee Latch note 10/2022), carotid artery disease, CKD stage IIIa, memory impairment/dementia, obesity who presented to [facility] ED on 8/16 from [facility] after she was noted to have increasing weakness over the last day.  Now requiring increasing assistance for activities including getting out of bed, staff noted weakness greater on the right than left.  Patient denied fever, no chills, no chest pain, no shortness of breath, no abdominal pain, no  nausea/vomiting/diarrhea.   Patient was recently seen in the outpatient neurosurgery clinic, Dr. Dutch Quint who ordered MR L-spine.  Results are still pending; and has yet to be seen in follow-up.   In the ED, temperature 97.6 F, HR 60, RR 18, BP 122/58, SpO2 99% on room air.  WBC 7.0, hemoglobin 10.7, platelet count 217.  Sodium 141, potassium 6.1, chloride 100, CO2 26, glucose 93, BUN 52, creatinine 2.68.  AST 46, ALT 18, total bilirubin 0.7.  Hemoglobin A1c 5.2, urinalysis unrevealing.  EtOH level less than 10.  UDS positive for opiates.  CT head without contrast with no evidence of acute intracranial abnormality, mild dural thickening left cerebral convexity favored postsurgical given overlying craniotomy.  Patient received 500 mL IV fluid bolus in the ED.  TRH consulted for admission for further evaluation management of concern for progressive weakness.  Hospital course:  Acute renal failure on CKD stage IIIa Creatinine elevated 2.68 on admission with a baseline 1.7.  Etiology likely secondary to prerenal azotemia/dehydration in the setting of home diuretic/Entresto use.  Initially home diuretics and Entresto were held.  She was started IV fluid hydration with improvement of creatinine to 1.60 at time of discharge.  Will restart furosemide at 20 mg p.o. daily, spironolactone 12.5 mg p.o. daily and Entresto at 24-26 mg p.o. twice daily.  Recommend monitoring of daily weights.  Recommend repeat BMP 1 week.  Outpatient follow-up with cardiology.   Hyperkalemia: Resolved Potassium elevated 6.1 admission, likely secondary to acute on chronic renal failure.  Supported with IV fluid hydration with resolution.   Generalized weakness Chronic low back pain Chronic debilitation Patient presenting from SNF with progressive weakness, staff report needing more assistance with ADLs, including to get out of the bed.  No paresthesias.  Although per outpatient cardiology note, Dr. Shirlee Latch on 10/2022 notes that she  is  wheelchair-bound.  Has recently seen neurosurgery, Dr. Dutch Quint outpatient with MR L-spine on 8/12; with results of multilevel lumbar spondylosis most pronounced at L4-5 w/ moderate right subarticular recess stenosis and mild right foraminal stenosis, no significant canal stenosis at any level.  Has yet to be seen in follow-up neurosurgery clinic.  CT head without contrast with no acute findings.  MR brain with and without contrast with no acute findings, notable chronic infarcts.  Seen by PT and OT with recommendations to return to SNF.  Outpatient follow-up with neurosurgery as scheduled.   Chronic combined systolic/diastolic congestive heart failure Essential hypertension Hx ascending aortic aneurysm s/p resection/grafting and placement of Saint Jude mechanical AVR Tachybradycardia syndrome s/p PPM Persistent atrial fibrillation on Coumadin Patient follows with cardiology/EP Dr. Shirlee Latch and Dr. Katrinka Blazing outpatient.  Last seen by Dr. Shirlee Latch on 11/21/2022.  Last TTE 2023 with LVEF 25-30%.  Home regimen includes amiodarone 200 mg p.o. daily, carvedilol 6.25 mg p.o. twice daily, Entresto 49-51 mg p.o. twice daily, spironolactone 25 mg p.o. daily, furosemide 60 mg p.o. daily. Continue amiodarone 200 mg p.o. daily, carvedilol 6.25 mg p.o. twice daily.  Reduce dose of Entresto at 24-26 mg p.o. twice daily, spironolactone 12.5 mg p.o. daily, furosemide 2 mg p.o. daily.  INR was initially subtherapeutic on admission and patient was bridged with Lovenox, INR now 2.7 at time of discharge.  Close outpatient follow-up with pharmacist at SNF.  Outpatient follow-up with cardiology.   Bipolar disorder Anxiety Home regimen includes Lamictal 100 mg p.o. daily, 200 mg p.o. nightly, Effexor 75 mg p.o. daily   Hypothyroidism TSH 1.313, free T4 1.40. Continue levothyroxine 50 mcg p.o. daily   History of CVA History of SDH, multiple falls requiring craniotomy Followed by PT/OT during hospitalization.  Discharging back to  SNF.   Cognitive impairment/early dementia Keep blinds open and lights on during daylight hours, Minimize the use of opioids/benzodiazepines   Wound left ankle Patient reports wound present for unclear amount of time, "years" and reports drainage from site.  Has not been evaluated previously.  Patient is afebrile without leukocytosis.  Unable to express any purulent material, no fluctuance, very minimal if no erythema surrounding site.  Given her renal function unable to obtain contrasted study.  Left ankle x-ray with mild edema, no concern for osteomyelitis. Cleanse with Vashe pure hypochlorous acid by way of a 10 minutes soak to the wound and surrounding skin followed by pat dry and placement of a size appropriate piece of silver hydrofiber to the wound topped with a piece of dry gauze and secured with a silicone foam for atraumatic dressing changes. Daily care while in the hospital is appropriate and may be reduced in frequency to 3 times weekly upon discharge if desired. Heels are to be floated while in bed.  Consider outpatient referral to vascular surgery.   Obesity Complicates all facets of care  Discharge Diagnoses:  Principal Problem:   Focal neurological deficit Active Problems:   Acute renal failure superimposed on stage 3a chronic kidney disease (HCC)   Essential hypertension   Bipolar disorder (HCC)   Hypothyroidism   GERD without esophagitis   Hx of mechanical aortic valve replacement   Long term current use of anticoagulant therapy   Atrial fibrillation (HCC)   Hyperlipidemia associated with type 2 diabetes mellitus (HCC)   Type 2 diabetes mellitus with diabetic polyneuropathy, without long-term current use of insulin (HCC)   Prolonged QT interval   Tachycardia-bradycardia syndrome (HCC)   Pacemaker  History of CVA (cerebrovascular accident)   Anemia of chronic kidney failure, stage 3 (moderate) (HCC)   Obesity (BMI 30-39.9)   Acute focal neurological  deficit    Discharge Instructions  Discharge Instructions     Call MD for:  difficulty breathing, headache or visual disturbances   Complete by: As directed    Call MD for:  extreme fatigue   Complete by: As directed    Call MD for:  persistant dizziness or light-headedness   Complete by: As directed    Call MD for:  persistant nausea and vomiting   Complete by: As directed    Call MD for:  severe uncontrolled pain   Complete by: As directed    Call MD for:  temperature >100.4   Complete by: As directed    Diet - low sodium heart healthy   Complete by: As directed    Discharge wound care:   Complete by: As directed    Wound care to left lateral malleolus full thickness wound: cleanse with Vashe wound cleanser and allow for Vashe moistened gauze to soak wound and surrounding skin for 10 minutes. Remove and pat dry. COver wound with small piece of silver hydrofiber, dry gauze and secure with silicone foam. Perform daily. Float heels while in bed.   Increase activity slowly   Complete by: As directed       Allergies as of 01/13/2023       Reactions   Mucinex [guaifenesin Er] Other (See Comments)   Severe headaches   Keflex [cephalexin] Other (See Comments)   Headaches Dizziness    Mucinex [guaifenesin Er] Other (See Comments)   Severe headaches   Sulfa Antibiotics Other (See Comments)   Headaches    Sulfonamide Derivatives Other (See Comments)   Headaches        Medication List     STOP taking these medications    Entresto 49-51 MG Generic drug: sacubitril-valsartan Replaced by: sacubitril-valsartan 24-26 MG       TAKE these medications    acetaminophen 500 MG tablet Commonly known as: TYLENOL Take 500 mg by mouth every 6 (six) hours.   amiodarone 200 MG tablet Commonly known as: PACERONE Take 200 mg by mouth daily.   carvedilol 6.25 MG tablet Commonly known as: COREG Take 6.25 mg by mouth 2 (two) times daily.   cyanocobalamin 1000 MCG  tablet Commonly known as: VITAMIN B12 Take 1,000 mcg by mouth every Monday.   dapagliflozin propanediol 10 MG Tabs tablet Commonly known as: FARXIGA Take 1 tablet (10 mg total) by mouth daily.   estradiol 0.1 MG/GM vaginal cream Commonly known as: ESTRACE Place 1 Applicatorful vaginally every Monday, Wednesday, and Friday.   fenofibrate 160 MG tablet Take 1 tablet (160 mg total) by mouth daily.   fexofenadine 60 MG tablet Commonly known as: ALLEGRA Take 60 mg by mouth daily.   fluticasone 50 MCG/ACT nasal spray Commonly known as: FLONASE Place 1 spray into both nostrils 2 (two) times daily.   furosemide 20 MG tablet Commonly known as: LASIX Take 1 tablet (20 mg total) by mouth daily. What changed: how much to take   HYDROcodone-acetaminophen 5-325 MG tablet Commonly known as: NORCO/VICODIN Take 1 tablet by mouth every 6 (six) hours.   hydrOXYzine 10 MG tablet Commonly known as: ATARAX Take 10 mg by mouth at bedtime.   lamoTRIgine 100 MG tablet Commonly known as: LAMICTAL Take 100 mg by mouth daily.   lamoTRIgine 200 MG tablet Commonly known as: LAMICTAL  Take 200 mg by mouth at bedtime.   levalbuterol 0.63 MG/3ML nebulizer solution Commonly known as: XOPENEX Take 0.63 mg by nebulization 2 (two) times daily.   lidocaine 4 % Place 1 patch onto the skin daily. To left lower back   meclizine 25 MG tablet Commonly known as: ANTIVERT Take 25 mg by mouth daily as needed for dizziness (vertigo).   melatonin 5 MG Tabs Take 10 mg by mouth at bedtime.   methocarbamol 500 MG tablet Commonly known as: ROBAXIN Take 500 mg by mouth 3 (three) times daily.   pantoprazole 20 MG tablet Commonly known as: PROTONIX Take 20 mg by mouth every other day.   potassium chloride SA 20 MEQ tablet Commonly known as: KLOR-CON M Take 1 tablet (20 mEq total) by mouth daily.   pregabalin 50 MG capsule Commonly known as: LYRICA Take 50 mg by mouth 3 (three) times daily.    rosuvastatin 40 MG tablet Commonly known as: CRESTOR Take 40 mg by mouth at bedtime.   sacubitril-valsartan 24-26 MG Commonly known as: ENTRESTO Take 1 tablet by mouth 2 (two) times daily. Replaces: Entresto 49-51 MG   spironolactone 25 MG tablet Commonly known as: Aldactone Take 0.5 tablets (12.5 mg total) by mouth daily. What changed: how much to take   Synthroid 50 MCG tablet Generic drug: levothyroxine Take 50 mcg by mouth daily before breakfast.   traZODone 150 MG tablet Commonly known as: DESYREL Take 1 tablet (150 mg total) by mouth at bedtime. What changed: how much to take   venlafaxine 75 MG tablet Commonly known as: EFFEXOR Take 75 mg by mouth daily.   warfarin 3 MG tablet Commonly known as: COUMADIN Take 1.5 mg by mouth See admin instructions. 1.5 mg (1/2 tablet) once a day in the evenings on Sunday, Monday, Wednesday, Friday   warfarin 2 MG tablet Commonly known as: COUMADIN Take 2 mg by mouth See admin instructions. 2 mg once a day in the evenings on Tuesday, Thursday, Saturday.               Discharge Care Instructions  (From admission, onward)           Start     Ordered   01/13/23 0000  Discharge wound care:       Comments: Wound care to left lateral malleolus full thickness wound: cleanse with Vashe wound cleanser and allow for Vashe moistened gauze to soak wound and surrounding skin for 10 minutes. Remove and pat dry. COver wound with small piece of silver hydrofiber, dry gauze and secure with silicone foam. Perform daily. Float heels while in bed.   01/13/23 1055            Contact information for follow-up providers     Blenda Mounts A, MD. Schedule an appointment as soon as possible for a visit in 1 week(s).   Specialty: Family Medicine Contact information: [address] [place] [address] [phone]         Laurey Morale, MD. Schedule an appointment as soon as possible for a visit in 2 week(s).    Specialty: Cardiology Contact information: [address] [place] [address] [phone]              Contact information for after-discharge care     Destination     [facility] Preferred SNF .   Service: Skilled Nursing Contact information: [address] [place] [address] [phone]  Allergies  Allergen Reactions   Mucinex [Guaifenesin Er] Other (See Comments)    Severe headaches    Keflex [Cephalexin] Other (See Comments)    Headaches Dizziness    Mucinex [Guaifenesin Er] Other (See Comments)    Severe headaches   Sulfa Antibiotics Other (See Comments)    Headaches    Sulfonamide Derivatives Other (See Comments)    Headaches     Consultations: None   Procedures/Studies: MR BRAIN WO CONTRAST  Result Date: 01/12/2023 CLINICAL DATA:  Provided history: Neuro deficit, acute, stroke suspected. Weakness. EXAM: MRI HEAD WITHOUT CONTRAST TECHNIQUE: Multiplanar, multiecho pulse sequences of the brain and surrounding structures were obtained without intravenous contrast. COMPARISON:  Head CT 01/09/2023.  Brain MRI 03/28/2020. FINDINGS: Brain: No age advanced or lobar predominant parenchymal atrophy. Small foci of chronic cortical encephalomalacia within the left frontal and parietal lobes, new from the prior brain MRI of 03/28/2020. These may reflect chronic infarcts or chronic parenchymal contusions. Small chronic cortical infarcts more posteriorly within the bilateral parietal lobes, also new from the prior MRI. Redemonstrated chronic lacunar infarct within the left parietal white matter. Background multifocal T2 FLAIR hyperintense signal abnormality within the cerebral white matter, nonspecific but compatible with mild chronic small vessel ischemic disease. Redemonstrated chronic hemorrhagic lacunar infarct within the right basal ganglia. Foci of chronic hemosiderin deposition  scattered along the bilateral cerebral and cerebellar hemispheres. There are a few punctate chronic microhemorrhages scattered within the supratentorial and infratentorial brain. Mild smooth dural thickening overlying portions of the left cerebral hemisphere, likely postsurgical/post-hemorrhagic. There is no acute infarct. No evidence of an intracranial mass. No extra-axial fluid collection. No midline shift. Vascular: Maintained flow voids within the proximal large arterial vessels. Skull and upper cervical spine: No focal suspicious marrow lesion. Left-sided cranioplasty. Sinuses/Orbits: No mass or acute finding within the imaged orbits. Prior bilateral ocular lens replacement. No significant paranasal sinus disease. IMPRESSION: 1. No evidence of an acute intracranial abnormality. 2. Small foci of chronic cortical encephalomalacia within the left frontal and parietal lobes, new from the prior brain MRI of 03/28/2020. These may reflect chronic infarcts or chronic parenchymal contusions. 3. Small chronic cortical infarcts more posteriorly within the bilateral parietal lobes, new from the prior MRI. 4. Redemonstrated chronic lacunar infarcts within the left parietal white matter and right basal ganglia. 5. Background mild cerebral white matter chronic small vessel ischemic disease, similar to the prior MRI. 6. Foci of chronic hemosiderin deposition scattered along the bilateral cerebral and cerebellar hemispheres, new from the prior MRI and likely posttraumatic in etiology. Electronically Signed   By: Jackey Loge D.O.   On: 01/12/2023 16:40   DG Ankle 2 Views Left  Result Date: 01/10/2023 CLINICAL DATA:  Cellulitis and wound of the ankle. EXAM: LEFT ANKLE - 2 VIEW COMPARISON:  None Available. FINDINGS: There is no evidence of fracture, dislocation, or joint effusion. Plantar and posterior calcaneal enthesophytes are noted. Degenerative changes are seen in the dorsal midfoot. No focal osseous demineralization to  suggest osteomyelitis. There is soft tissue swelling around the ankle. IMPRESSION: Soft tissue swelling around the ankle without radiographic evidence of osteomyelitis. Electronically Signed   By: Romona Curls M.D.   On: 01/10/2023 17:44   MR LUMBAR SPINE WO CONTRAST  Result Date: 01/10/2023 CLINICAL DATA:  Low back pain EXAM: MRI LUMBAR SPINE WITHOUT CONTRAST TECHNIQUE: Multiplanar, multisequence MR imaging of the lumbar spine was performed. No intravenous contrast was administered. COMPARISON:  X-ray 08/21/2019 FINDINGS: Segmentation: Presumed standard anatomy with the inferior-most  well developed disc space designated as L5-S1. Alignment: Lumbar dextrocurvature. No significant listhesis within the sagittal plane. Vertebrae: No acute fracture, evidence of discitis, or bone lesion. Discogenic endplate changes at the L2-3 level. Conus medullaris and cauda equina: Conus extends to the L1-L2 level. Conus and cauda equina appear normal. Paraspinal and other soft tissues: Colonic diverticulosis. No acute abnormality. Disc levels: T12-L1: Mild diffuse disc bulge.  No foraminal or canal stenosis. L1-L2: Annular disc bulge and mild facet hypertrophy. No foraminal or canal stenosis. L2-L3: Annular disc bulge with shallow left paracentral protrusion. Mild bilateral facet arthropathy. Mild left subarticular recess stenosis without canal stenosis. No significant foraminal stenosis. L3-L4: Annular disc bulge with shallow left paracentral protrusion. Mild bilateral facet arthropathy. Mild left subarticular recess stenosis without canal stenosis. Mild left foraminal stenosis. L4-L5: Disc bulge with bulging disc and endplate spurring in the right foraminal zone. Mild facet hypertrophy. Moderate right subarticular recess stenosis without canal stenosis. Mild right foraminal stenosis. L5-S1: No significant disc protrusion. Mild facet hypertrophy. Mild right foraminal stenosis. No canal stenosis. IMPRESSION: 1. Multilevel lumbar  spondylosis, most pronounced at the L4-5 level where there is moderate right subarticular recess stenosis and mild right foraminal stenosis. 2. No significant canal stenosis at any level. 3. Mild left subarticular recess stenosis at L2-3 and L3-4. 4. Mild left foraminal stenosis at L3-4. Mild right foraminal stenosis at L5-S1. 5. Colonic diverticulosis. Electronically Signed   By: Duanne Guess D.O.   On: 01/10/2023 09:08   CT Head Wo Contrast  Result Date: 01/09/2023 CLINICAL DATA:  Neuro deficit, acute, stroke suspected EXAM: CT HEAD WITHOUT CONTRAST TECHNIQUE: Contiguous axial images were obtained from the base of the skull through the vertex without intravenous contrast. RADIATION DOSE REDUCTION: This exam was performed according to the departmental dose-optimization program which includes automated exposure control, adjustment of the mA and/or kV according to patient size and/or use of iterative reconstruction technique. COMPARISON:  CT head 06/13/2020. FINDINGS: Brain: No convincing evidence of acute hemorrhage. No evidence of acute large vascular territory infarction, hydrocephalus, extra-axial collection or mass lesion/mass effect. Mild dural thickening along the left cerebral convexity with areas of mineralization, favored postsurgical given overlying craniotomy. Vascular: No hyperdense vessel.  Calcific atherosclerosis. Skull: No acute fracture.  Left craniotomy. Sinuses/Orbits: Clear sinuses per neck overall findings. IMPRESSION: 1. No evidence of acute intracranial abnormality. 2. Mild dural thickening along the left cerebral convexity with areas of mineralization, favored postsurgical given overlying craniotomy. Electronically Signed   By: Feliberto Harts M.D.   On: 01/09/2023 15:17     Subjective: Patient seen examined bedside, resting calmly.  Sitting in bedside chair.  No specific complaints, eating breakfast this morning.  Discharging back to SNF today.  Discussed reduced doses of her  diuretics, Entresto.  Creatinine now back to her baseline.  No other specific questions or concerns at this time.  Denies headache, no dizziness, no chest pain, no palpitations, no shortness of breath, no abdominal pain, no fever/chills/night sweats, no nausea/vomit/diarrhea, no focal weakness, no fatigue, no paresthesias.  No acute events overnight per nursing staff.  Discharge Exam: Vitals:   01/13/23 0333 01/13/23 0750  BP: (!) 139/55 139/62  Pulse: 61 65  Resp: 16 17  Temp: 98 F (36.7 C) 98.2 F (36.8 C)  SpO2: 96% 96%   Vitals:   01/12/23 1959 01/12/23 2318 01/13/23 0333 01/13/23 0750  BP: 138/65 (!) 137/55 (!) 139/55 139/62  Pulse: 62 (!) 59 61 65  Resp: 17 16 16  17  Temp: 98.5 F (36.9 C) 97.9 F (36.6 C) 98 F (36.7 C) 98.2 F (36.8 C)  TempSrc: Oral Oral Oral Oral  SpO2: 100% 95% 96% 96%  Weight:      Height:        Physical Exam: GEN: NAD, alert and oriented x 3, chronically ill in appearance, obese HEENT: NCAT, PERRL, EOMI, sclera clear, MMM PULM: CTAB w/o wheezes/crackles, normal respiratory effort, on room air CV: RRR w/ + mechanical click, no M/G/R GI: abd soft, NTND, NABS, no R/G/M MSK: no peripheral edema, decreased muscle strength bilateral lower extremities 2/5 bilaterally, no paresthesias NEURO: CN II-XII intact, no focal deficits, sensation to light touch intact, muscle strength bilateral lower extremities as above PSYCH: normal mood/affect Integumentary: wound noted left lateral malleolus, No other, concerning rashes/lesions/wounds noted on exposed skin surfaces.      The results of significant diagnostics from this hospitalization (including imaging, microbiology, ancillary and laboratory) are listed below for reference.     Microbiology: No results found for this or any previous visit (from the past 240 hour(s)).   Labs: BNP (last 3 results) Recent Labs    04/03/22 1141 07/04/22 1420 11/21/22 1010  BNP 89.5 116.0* 119.0*   Basic  Metabolic Panel: Recent Labs  Lab 01/09/23 2015 01/10/23 0625 01/11/23 0447 01/12/23 0452 01/12/23 2342  NA 138 140 137 139 140  K 4.1 4.2 3.9 4.0 3.8  CL 100 102 103 107 108  CO2 30 29 23 25 24   GLUCOSE 137* 113* 96 96 101*  BUN 46* 43* 30* 27* 25*  CREATININE 2.53* 2.35* 1.67* 1.49* 1.60*  CALCIUM 8.8* 9.1 8.8* 9.3 9.2   Liver Function Tests: Recent Labs  Lab 01/09/23 1309 01/09/23 2015 01/10/23 0625  AST 46* 31 32  ALT 18 18 19   ALKPHOS 23* 21* 26*  BILITOT 0.7 0.8 0.6  PROT 6.6 6.7 6.7  ALBUMIN 3.9 3.6 3.8   No results for input(s): "LIPASE", "AMYLASE" in the last 168 hours. No results for input(s): "AMMONIA" in the last 168 hours. CBC: Recent Labs  Lab 01/09/23 1309 01/09/23 1339 01/10/23 0625 01/12/23 0452  WBC 7.0  --  8.4 5.7  NEUTROABS 5.0  --   --   --   HGB 10.7* 11.9* 11.4* 10.8*  HCT 34.4* 35.0* 36.9 33.4*  MCV 101.8*  --  101.4* 97.4  PLT 217  --  221 222   Cardiac Enzymes: No results for input(s): "CKTOTAL", "CKMB", "CKMBINDEX", "TROPONINI" in the last 168 hours. BNP: Invalid input(s): "POCBNP" CBG: Recent Labs  Lab 01/12/23 0615 01/12/23 1201 01/12/23 1630 01/12/23 2107 01/13/23 0609  GLUCAP 102* 82 132* 109* 103*   D-Dimer No results for input(s): "DDIMER" in the last 72 hours. Hgb A1c No results for input(s): "HGBA1C" in the last 72 hours. Lipid Profile No results for input(s): "CHOL", "HDL", "LDLCALC", "TRIG", "CHOLHDL", "LDLDIRECT" in the last 72 hours. Thyroid function studies No results for input(s): "TSH", "T4TOTAL", "T3FREE", "THYROIDAB" in the last 72 hours.  Invalid input(s): "FREET3" Anemia work up No results for input(s): "VITAMINB12", "FOLATE", "FERRITIN", "TIBC", "IRON", "RETICCTPCT" in the last 72 hours. Urinalysis    Component Value Date/Time   COLORURINE YELLOW 01/09/2023 1815   APPEARANCEUR CLEAR 01/09/2023 1815   LABSPEC 1.011 01/09/2023 1815   PHURINE 6.0 01/09/2023 1815   GLUCOSEU 50 (A) 01/09/2023  1815   GLUCOSEU NEGATIVE 11/02/2017 1428   HGBUR NEGATIVE 01/09/2023 1815   BILIRUBINUR NEGATIVE 01/09/2023 1815   BILIRUBINUR neg 04/26/2020 1619  KETONESUR NEGATIVE 01/09/2023 1815   PROTEINUR NEGATIVE 01/09/2023 1815   UROBILINOGEN 0.2 04/26/2020 1619   UROBILINOGEN 0.2 11/02/2017 1428   NITRITE NEGATIVE 01/09/2023 1815   LEUKOCYTESUR NEGATIVE 01/09/2023 1815   Sepsis Labs Recent Labs  Lab 01/09/23 1309 01/10/23 0625 01/12/23 0452  WBC 7.0 8.4 5.7   Microbiology No results found for this or any previous visit (from the past 240 hour(s)).   Time coordinating discharge: Over 30 minutes  SIGNED:   Alvira Philips [place], DO  [facility] 01/13/2023, 10:55 AM

## 2023-01-13 NOTE — NC FL2 (Signed)
[place] MEDICAID FL2 LEVEL OF CARE FORM     IDENTIFICATION  Patient Name: [name] Birthdate: 1951/01/11 Sex: female Admission Date (Current Location): 01/09/2023  [facility] and [place] Number:  Producer, television/film/video and Address:  [facility], [address], [place], [place] [address]      Provider Number: [id number]  Attending Physician Name and Address:  [place], Alvira Philips, DO  Relative Name and Phone Number:       Current Level of Care: Hospital Recommended Level of Care: Skilled Nursing Facility Prior Approval Number:    Date Approved/Denied:   PASRR Number:    Discharge Plan: SNF    Current Diagnoses: Patient Active Problem List   Diagnosis Date Noted   Focal neurological deficit 01/09/2023   Acute focal neurological deficit 01/09/2023   History of CVA (cerebrovascular accident) 07/03/2021   Stage 3a chronic kidney disease (CKD) (HCC) 07/03/2021   Anemia of chronic kidney failure, stage 3 (moderate) (HCC) 07/03/2021   Obesity (BMI 30-39.9) 07/03/2021   Essential hypertension 07/24/2020   GERD without esophagitis 07/24/2020   Bipolar disorder (HCC) 07/24/2020   Hypothyroidism 07/24/2020   Acute renal failure superimposed on stage 3a chronic kidney disease (HCC) 07/24/2020   Vitamin B 12 deficiency 05/06/2020   Elevated liver enzymes 04/30/2020   Tachycardia-bradycardia syndrome (HCC) 04/25/2020   Pacemaker 04/25/2020   Recurrent falls 03/27/2020   Bradycardia 08/23/2019   Prolonged QT interval    GAD (generalized anxiety disorder) 04/25/2018   Nocturia 11/02/2017   Low back pain 11/02/2017   Type 2 diabetes mellitus with diabetic polyneuropathy, without long-term current use of insulin (HCC) 11/02/2017   Hyperlipidemia associated with type 2 diabetes mellitus (HCC) 06/05/2016   Migraine 06/05/2016   Atrial fibrillation (HCC) 03/18/2013   Long term current use of anticoagulant therapy 03/11/2013   Pernicious anemia 02/21/2011    DIVERTICULOSIS OF COLON 03/11/2010   Hx of mechanical aortic valve replacement 03/11/2010    Orientation RESPIRATION BLADDER Height & Weight     Self, Time, Situation, Place  Normal Incontinent Weight: 240 lb 4.8 oz (109 kg) Height:  5\' 8"  (172.7 cm)  BEHAVIORAL SYMPTOMS/MOOD NEUROLOGICAL BOWEL NUTRITION STATUS      Continent Diet (heart healthy)  AMBULATORY STATUS COMMUNICATION OF NEEDS Skin   Extensive Assist Verbally Other (Comment) (open sore, left foot, silver hydrofiber)                       Personal Care Assistance Level of Assistance  Bathing, Feeding, Dressing Bathing Assistance: Maximum assistance Feeding assistance: Limited assistance Dressing Assistance: Maximum assistance     Functional Limitations Info             SPECIAL CARE FACTORS FREQUENCY                       Contractures Contractures Info: Not present    Additional Factors Info  Code Status, Allergies Code Status Info: DNR Allergies Info: Mucinex (Guaifenesin Er), Keflex (Cephalexin), Mucinex (Guaifenesin Er), Sulfa Antibiotics, Sulfonamide Derivatives           Current Medications (01/13/2023):  This is the current hospital active medication list Current Facility-Administered Medications  Medication Dose Route Frequency Provider Last Rate Last Admin   acetaminophen (TYLENOL) tablet 650 mg  650 mg Oral Q6H PRN Synetta Fail, MD   650 mg at 01/11/23 0044   Or   acetaminophen (TYLENOL) suppository 650 mg  650 mg  Rectal Q6H PRN Synetta Fail, MD       albuterol (PROVENTIL) (2.5 MG/3ML) 0.083% nebulizer solution 3 mL  3 mL Inhalation Q6H PRN [place], Alvira Philips, DO       amiodarone (PACERONE) tablet 200 mg  200 mg Oral Daily Synetta Fail, MD   200 mg at 01/13/23 0854   carvedilol (COREG) tablet 6.25 mg  6.25 mg Oral BID WC Synetta Fail, MD   6.25 mg at 01/13/23 0854   dapagliflozin propanediol (FARXIGA) tablet 10 mg  10 mg Oral Daily Synetta Fail, MD   10  mg at 01/13/23 0854   enoxaparin (LOVENOX) injection 100 mg  100 mg Subcutaneous Q12H [place], Eric J, DO   100 mg at 01/13/23 1610   fenofibrate tablet 160 mg  160 mg Oral Daily Synetta Fail, MD   160 mg at 01/13/23 0854   fluticasone (FLONASE) 50 MCG/ACT nasal spray 1 spray  1 spray Each Nare BID Janalyn Shy, Subrina, MD   1 spray at 01/13/23 0855   insulin aspart (novoLOG) injection 0-9 Units  0-9 Units Subcutaneous TID WC Synetta Fail, MD   1 Units at 01/11/23 1240   lamoTRIgine (LAMICTAL) tablet 100 mg  100 mg Oral Daily Synetta Fail, MD   100 mg at 01/13/23 9604   lamoTRIgine (LAMICTAL) tablet 200 mg  200 mg Oral QHS Synetta Fail, MD   200 mg at 01/12/23 2205   levothyroxine (SYNTHROID) tablet 50 mcg  50 mcg Oral QAC breakfast Synetta Fail, MD   50 mcg at 01/13/23 5409   melatonin tablet 10 mg  10 mg Oral QHS Synetta Fail, MD   10 mg at 01/12/23 2205   naphazoline-glycerin (CLEAR EYES REDNESS) ophth solution 1-2 drop  1-2 drop Both Eyes QID PRN [place], Eric J, DO   2 drop at 01/13/23 0855   pantoprazole (PROTONIX) EC tablet 20 mg  20 mg Oral Margit Hanks, MD   20 mg at 01/12/23 0841   polyethylene glycol (MIRALAX / GLYCOLAX) packet 17 g  17 g Oral Daily PRN Synetta Fail, MD       rosuvastatin (CRESTOR) tablet 40 mg  40 mg Oral Daily Synetta Fail, MD   40 mg at 01/13/23 8119   sacubitril-valsartan (ENTRESTO) 24-26 mg per tablet  1 tablet Oral BID [place], Eric J, DO   1 tablet at 01/13/23 0854   sodium chloride flush (NS) 0.9 % injection 3 mL  3 mL Intravenous Q12H Synetta Fail, MD   3 mL at 01/13/23 0856   traZODone (DESYREL) tablet 150 mg  150 mg Oral QHS Synetta Fail, MD   150 mg at 01/12/23 2204   venlafaxine [facility]) tablet 75 mg  75 mg Oral Daily [place], Eric J, DO   75 mg at 01/13/23 1478   Warfarin - Pharmacist Dosing Inpatient   Does not apply q1600 Griffin Dakin, [facility]   Given at 01/11/23 2956      Discharge Medications: Please see discharge summary for a list of discharge medications.  Relevant Imaging Results:  Relevant Lab Results:   Additional Information SS#: [id number]  Baldemar Lenis, LCSW

## 2023-01-13 NOTE — TOC Transition Note (Signed)
Transition of Care [facility]) - CM/SW Discharge Note   Patient Details  Name: [name] MRN: [id number] Date of Birth: December 21, 1950  Transition of Care [facility]) CM/SW Contact:  Baldemar Lenis, LCSW Phone Number: 01/13/2023, 1:05 PM   Clinical Narrative:   CSW confirmed with MD that patient is medically stable, sent discharge information to [facility]. [facility] ready to accept. Transport arranged with [facility] for next available, patient in agreement.  Nurse to call report to [phone], Room 904A.    Final next level of care: Skilled Nursing Facility Barriers to Discharge: Barriers Resolved   Patient Goals and CMS Choice      Discharge Placement                Patient chooses bed at: [facility] Patient to be transferred to facility by: [facility] Name of family member notified: Self Patient and family notified of of transfer: 01/13/23  Discharge Plan and Services Additional resources added to the After Visit Summary for                                       Social Determinants of Health (SDOH) Interventions SDOH Screenings   Food Insecurity: No Food Insecurity (01/09/2023)  Housing: Low Risk  (01/09/2023)  Transportation Needs: No Transportation Needs (01/09/2023)  Utilities: Not At Risk (01/09/2023)  Tobacco Use: Medium Risk (01/09/2023)     Readmission Risk Interventions    05/03/2020   10:59 AM  Readmission Risk Prevention Plan  Transportation Screening Complete  Home Care Screening Complete  Medication Review (RN CM) Complete

## 2023-01-13 NOTE — Discharge Instructions (Signed)
 FURTHER DISCHARGE INSTRUCTIONS:   Get Medicines reviewed and adjusted: Please take all your medications with you for your next visit with your Primary MD   Laboratory/radiological data: Please request your Primary MD to go over all hospital tests and procedure/radiological results at the follow up, please ask your Primary MD to get all Hospital records sent to his/her office.   In some cases, they will be blood work, cultures and biopsy results pending at the time of your discharge. Please request that your primary care M.D. goes through all the records of your hospital data and follows up on these results.   Also Note the following: If you experience worsening of your admission symptoms, develop shortness of breath, life threatening emergency, suicidal or homicidal thoughts you must seek medical attention immediately by calling 911 or calling your MD immediately  if symptoms less severe.   You must read complete instructions/literature along with all the possible adverse reactions/side effects for all the Medicines you take and that have been prescribed to you. Take any new Medicines after you have completely understood and accpet all the possible adverse reactions/side effects.    Do not drive when taking Pain medications or sleeping medications (Benzodaizepines)   Do not take more than prescribed Pain, Sleep and Anxiety Medications. It is not advisable to combine anxiety,sleep and pain medications without talking with your primary care practitioner   Special Instructions: If you have smoked or chewed Tobacco  in the last 2 yrs please stop smoking, stop any regular Alcohol  and or any Recreational drug use.   Wear Seat belts while driving.   Please note: You were cared for by a hospitalist during your hospital stay. Once you are discharged, your primary care physician will handle any further medical issues. Please note that NO REFILLS for any discharge medications will be authorized once  you are discharged, as it is imperative that you return to your primary care physician (or establish a relationship with a primary care physician if you do not have one) for your post hospital discharge needs so that they can reassess your need for medications and monitor your lab values.

## 2023-01-13 NOTE — Plan of Care (Signed)
  Problem: Pain Managment: Goal: General experience of comfort will improve Outcome: Progressing   Problem: Safety: Goal: Ability to remain free from injury will improve Outcome: Progressing   Problem: Skin Integrity: Goal: Risk for impaired skin integrity will decrease Outcome: Progressing   

## 2023-01-15 DIAGNOSIS — E785 Hyperlipidemia, unspecified: Secondary | ICD-10-CM | POA: Diagnosis not present

## 2023-01-15 DIAGNOSIS — I110 Hypertensive heart disease with heart failure: Secondary | ICD-10-CM | POA: Diagnosis not present

## 2023-01-15 DIAGNOSIS — Z7901 Long term (current) use of anticoagulants: Secondary | ICD-10-CM | POA: Diagnosis not present

## 2023-01-15 DIAGNOSIS — R262 Difficulty in walking, not elsewhere classified: Secondary | ICD-10-CM | POA: Diagnosis not present

## 2023-01-15 DIAGNOSIS — I5042 Chronic combined systolic (congestive) and diastolic (congestive) heart failure: Secondary | ICD-10-CM | POA: Diagnosis not present

## 2023-01-15 DIAGNOSIS — E039 Hypothyroidism, unspecified: Secondary | ICD-10-CM | POA: Diagnosis not present

## 2023-01-15 DIAGNOSIS — M47816 Spondylosis without myelopathy or radiculopathy, lumbar region: Secondary | ICD-10-CM | POA: Diagnosis not present

## 2023-01-15 DIAGNOSIS — N1831 Chronic kidney disease, stage 3a: Secondary | ICD-10-CM | POA: Diagnosis not present

## 2023-01-15 DIAGNOSIS — S91002A Unspecified open wound, left ankle, initial encounter: Secondary | ICD-10-CM | POA: Diagnosis not present

## 2023-01-15 DIAGNOSIS — E1122 Type 2 diabetes mellitus with diabetic chronic kidney disease: Secondary | ICD-10-CM | POA: Diagnosis not present

## 2023-01-15 DIAGNOSIS — N179 Acute kidney failure, unspecified: Secondary | ICD-10-CM | POA: Diagnosis not present

## 2023-01-15 DIAGNOSIS — F319 Bipolar disorder, unspecified: Secondary | ICD-10-CM | POA: Diagnosis not present

## 2023-01-20 DIAGNOSIS — S91002A Unspecified open wound, left ankle, initial encounter: Secondary | ICD-10-CM | POA: Diagnosis not present

## 2023-01-21 DIAGNOSIS — R0989 Other specified symptoms and signs involving the circulatory and respiratory systems: Secondary | ICD-10-CM | POA: Diagnosis not present

## 2023-01-22 DIAGNOSIS — I10 Essential (primary) hypertension: Secondary | ICD-10-CM | POA: Diagnosis not present

## 2023-01-23 DIAGNOSIS — I482 Chronic atrial fibrillation, unspecified: Secondary | ICD-10-CM | POA: Diagnosis not present

## 2023-01-27 DIAGNOSIS — I10 Essential (primary) hypertension: Secondary | ICD-10-CM | POA: Diagnosis not present

## 2023-01-27 DIAGNOSIS — R262 Difficulty in walking, not elsewhere classified: Secondary | ICD-10-CM | POA: Diagnosis not present

## 2023-01-27 DIAGNOSIS — R296 Repeated falls: Secondary | ICD-10-CM | POA: Diagnosis not present

## 2023-01-27 DIAGNOSIS — S91002A Unspecified open wound, left ankle, initial encounter: Secondary | ICD-10-CM | POA: Diagnosis not present

## 2023-01-27 DIAGNOSIS — M6281 Muscle weakness (generalized): Secondary | ICD-10-CM | POA: Diagnosis not present

## 2023-01-29 DIAGNOSIS — M5416 Radiculopathy, lumbar region: Secondary | ICD-10-CM | POA: Diagnosis not present

## 2023-01-30 DIAGNOSIS — R791 Abnormal coagulation profile: Secondary | ICD-10-CM | POA: Diagnosis not present

## 2023-02-02 DIAGNOSIS — R791 Abnormal coagulation profile: Secondary | ICD-10-CM | POA: Diagnosis not present

## 2023-02-02 DIAGNOSIS — F5101 Primary insomnia: Secondary | ICD-10-CM | POA: Diagnosis not present

## 2023-02-02 DIAGNOSIS — F3132 Bipolar disorder, current episode depressed, moderate: Secondary | ICD-10-CM | POA: Diagnosis not present

## 2023-02-03 DIAGNOSIS — S91002A Unspecified open wound, left ankle, initial encounter: Secondary | ICD-10-CM | POA: Diagnosis not present

## 2023-02-05 DIAGNOSIS — I509 Heart failure, unspecified: Secondary | ICD-10-CM | POA: Diagnosis not present

## 2023-02-06 DIAGNOSIS — M5441 Lumbago with sciatica, right side: Secondary | ICD-10-CM | POA: Diagnosis not present

## 2023-02-06 DIAGNOSIS — Z87828 Personal history of other (healed) physical injury and trauma: Secondary | ICD-10-CM | POA: Diagnosis not present

## 2023-02-06 DIAGNOSIS — I5042 Chronic combined systolic (congestive) and diastolic (congestive) heart failure: Secondary | ICD-10-CM | POA: Diagnosis not present

## 2023-02-06 DIAGNOSIS — M5442 Lumbago with sciatica, left side: Secondary | ICD-10-CM | POA: Diagnosis not present

## 2023-02-06 DIAGNOSIS — F319 Bipolar disorder, unspecified: Secondary | ICD-10-CM | POA: Diagnosis not present

## 2023-02-06 DIAGNOSIS — D631 Anemia in chronic kidney disease: Secondary | ICD-10-CM | POA: Diagnosis not present

## 2023-02-06 DIAGNOSIS — Z7901 Long term (current) use of anticoagulants: Secondary | ICD-10-CM | POA: Diagnosis not present

## 2023-02-06 DIAGNOSIS — I482 Chronic atrial fibrillation, unspecified: Secondary | ICD-10-CM | POA: Diagnosis not present

## 2023-02-06 DIAGNOSIS — Z8639 Personal history of other endocrine, nutritional and metabolic disease: Secondary | ICD-10-CM | POA: Diagnosis not present

## 2023-02-06 DIAGNOSIS — R296 Repeated falls: Secondary | ICD-10-CM | POA: Diagnosis not present

## 2023-02-06 DIAGNOSIS — N1831 Chronic kidney disease, stage 3a: Secondary | ICD-10-CM | POA: Diagnosis not present

## 2023-02-09 DIAGNOSIS — R739 Hyperglycemia, unspecified: Secondary | ICD-10-CM | POA: Diagnosis not present

## 2023-02-09 DIAGNOSIS — R000 Tachycardia, unspecified: Secondary | ICD-10-CM | POA: Diagnosis not present

## 2023-02-09 DIAGNOSIS — R112 Nausea with vomiting, unspecified: Secondary | ICD-10-CM | POA: Diagnosis not present

## 2023-02-09 DIAGNOSIS — I10 Essential (primary) hypertension: Secondary | ICD-10-CM | POA: Diagnosis not present

## 2023-02-10 ENCOUNTER — Ambulatory Visit (INDEPENDENT_AMBULATORY_CARE_PROVIDER_SITE_OTHER): Payer: PPO

## 2023-02-10 DIAGNOSIS — I495 Sick sinus syndrome: Secondary | ICD-10-CM | POA: Diagnosis not present

## 2023-02-10 DIAGNOSIS — S91002A Unspecified open wound, left ankle, initial encounter: Secondary | ICD-10-CM | POA: Diagnosis not present

## 2023-02-11 LAB — CUP PACEART REMOTE DEVICE CHECK
Battery Remaining Longevity: 138 mo
Battery Remaining Percentage: 100 %
Brady Statistic RA Percent Paced: 86 %
Brady Statistic RV Percent Paced: 35 %
Date Time Interrogation Session: 20240918023000
Implantable Lead Connection Status: 753985
Implantable Lead Connection Status: 753985
Implantable Lead Implant Date: 20210816
Implantable Lead Implant Date: 20210816
Implantable Lead Location: 753859
Implantable Lead Location: 753860
Implantable Lead Model: 7842
Implantable Lead Model: 7842
Implantable Lead Serial Number: 1013287
Implantable Lead Serial Number: 1091261
Implantable Pulse Generator Implant Date: 20210816
Lead Channel Impedance Value: 439 Ohm
Lead Channel Impedance Value: 682 Ohm
Lead Channel Setting Pacing Amplitude: 2.5 V
Lead Channel Setting Pacing Amplitude: 2.5 V
Lead Channel Setting Pacing Pulse Width: 0.4 ms
Lead Channel Setting Sensing Sensitivity: 2.5 mV
Pulse Gen Serial Number: 940432
Zone Setting Status: 755011

## 2023-02-16 DIAGNOSIS — R296 Repeated falls: Secondary | ICD-10-CM | POA: Diagnosis not present

## 2023-02-16 DIAGNOSIS — I10 Essential (primary) hypertension: Secondary | ICD-10-CM | POA: Diagnosis not present

## 2023-02-16 DIAGNOSIS — M6281 Muscle weakness (generalized): Secondary | ICD-10-CM | POA: Diagnosis not present

## 2023-02-16 DIAGNOSIS — R262 Difficulty in walking, not elsewhere classified: Secondary | ICD-10-CM | POA: Diagnosis not present

## 2023-02-17 DIAGNOSIS — S91002A Unspecified open wound, left ankle, initial encounter: Secondary | ICD-10-CM | POA: Diagnosis not present

## 2023-02-18 DIAGNOSIS — I5042 Chronic combined systolic (congestive) and diastolic (congestive) heart failure: Secondary | ICD-10-CM | POA: Diagnosis not present

## 2023-02-18 DIAGNOSIS — Z7901 Long term (current) use of anticoagulants: Secondary | ICD-10-CM | POA: Diagnosis not present

## 2023-02-18 DIAGNOSIS — M5442 Lumbago with sciatica, left side: Secondary | ICD-10-CM | POA: Diagnosis not present

## 2023-02-18 DIAGNOSIS — M5441 Lumbago with sciatica, right side: Secondary | ICD-10-CM | POA: Diagnosis not present

## 2023-02-18 DIAGNOSIS — R4182 Altered mental status, unspecified: Secondary | ICD-10-CM | POA: Diagnosis not present

## 2023-02-18 DIAGNOSIS — M47816 Spondylosis without myelopathy or radiculopathy, lumbar region: Secondary | ICD-10-CM | POA: Diagnosis not present

## 2023-02-18 DIAGNOSIS — I482 Chronic atrial fibrillation, unspecified: Secondary | ICD-10-CM | POA: Diagnosis not present

## 2023-02-20 ENCOUNTER — Ambulatory Visit (HOSPITAL_BASED_OUTPATIENT_CLINIC_OR_DEPARTMENT_OTHER)
Admission: RE | Admit: 2023-02-20 | Discharge: 2023-02-20 | Disposition: A | Discharge status: Home / Self Care | Payer: PPO | Source: Ambulatory Visit | Attending: Cardiology | Admitting: Cardiology

## 2023-02-20 ENCOUNTER — Ambulatory Visit (HOSPITAL_COMMUNITY)
Admission: RE | Admit: 2023-02-20 | Discharge: 2023-02-20 | Disposition: A | Discharge status: Home / Self Care | Payer: PPO | Source: Ambulatory Visit | Attending: Cardiology | Admitting: Cardiology

## 2023-02-20 ENCOUNTER — Encounter (HOSPITAL_COMMUNITY): Payer: Self-pay | Admitting: Cardiology

## 2023-02-20 VITALS — BP 104/60 | HR 60 | Wt 237.0 lb

## 2023-02-20 DIAGNOSIS — Z993 Dependence on wheelchair: Secondary | ICD-10-CM | POA: Diagnosis not present

## 2023-02-20 DIAGNOSIS — S91009D Unspecified open wound, unspecified ankle, subsequent encounter: Secondary | ICD-10-CM

## 2023-02-20 DIAGNOSIS — I130 Hypertensive heart and chronic kidney disease with heart failure and stage 1 through stage 4 chronic kidney disease, or unspecified chronic kidney disease: Secondary | ICD-10-CM | POA: Insufficient documentation

## 2023-02-20 DIAGNOSIS — I5022 Chronic systolic (congestive) heart failure: Secondary | ICD-10-CM | POA: Diagnosis not present

## 2023-02-20 DIAGNOSIS — S91009A Unspecified open wound, unspecified ankle, initial encounter: Secondary | ICD-10-CM | POA: Insufficient documentation

## 2023-02-20 DIAGNOSIS — L97328 Non-pressure chronic ulcer of left ankle with other specified severity: Secondary | ICD-10-CM | POA: Diagnosis not present

## 2023-02-20 DIAGNOSIS — I4819 Other persistent atrial fibrillation: Secondary | ICD-10-CM | POA: Diagnosis present

## 2023-02-20 DIAGNOSIS — I495 Sick sinus syndrome: Secondary | ICD-10-CM | POA: Insufficient documentation

## 2023-02-20 DIAGNOSIS — Z7901 Long term (current) use of anticoagulants: Secondary | ICD-10-CM | POA: Diagnosis not present

## 2023-02-20 DIAGNOSIS — R9431 Abnormal electrocardiogram [ECG] [EKG]: Secondary | ICD-10-CM | POA: Diagnosis not present

## 2023-02-20 DIAGNOSIS — R296 Repeated falls: Secondary | ICD-10-CM | POA: Insufficient documentation

## 2023-02-20 DIAGNOSIS — N183 Chronic kidney disease, stage 3 unspecified: Secondary | ICD-10-CM | POA: Diagnosis not present

## 2023-02-20 DIAGNOSIS — I480 Paroxysmal atrial fibrillation: Secondary | ICD-10-CM | POA: Insufficient documentation

## 2023-02-20 DIAGNOSIS — Z8673 Personal history of transient ischemic attack (TIA), and cerebral infarction without residual deficits: Secondary | ICD-10-CM | POA: Diagnosis present

## 2023-02-20 DIAGNOSIS — Z952 Presence of prosthetic heart valve: Secondary | ICD-10-CM | POA: Diagnosis not present

## 2023-02-20 DIAGNOSIS — E1122 Type 2 diabetes mellitus with diabetic chronic kidney disease: Secondary | ICD-10-CM | POA: Insufficient documentation

## 2023-02-20 DIAGNOSIS — D631 Anemia in chronic kidney disease: Secondary | ICD-10-CM | POA: Insufficient documentation

## 2023-02-20 DIAGNOSIS — Z990 Dependence on aspirator: Secondary | ICD-10-CM | POA: Diagnosis present

## 2023-02-20 DIAGNOSIS — F319 Bipolar disorder, unspecified: Secondary | ICD-10-CM | POA: Diagnosis not present

## 2023-02-20 DIAGNOSIS — I428 Other cardiomyopathies: Secondary | ICD-10-CM | POA: Insufficient documentation

## 2023-02-20 LAB — ECHOCARDIOGRAM COMPLETE
AR max vel: 1.1 cm2
AV Area VTI: 1.1 cm2
AV Area mean vel: 1.09 cm2
AV Mean grad: 9 mm[Hg]
AV Peak grad: 17.7 mm[Hg]
Ao pk vel: 2.11 m/s
Area-P 1/2: 3 cm2
Calc EF: 65.3 %
S' Lateral: 3.55 cm
Single Plane A2C EF: 64 %
Single Plane A4C EF: 65.2 %

## 2023-02-20 LAB — CBC
HCT: 35 % — ABNORMAL LOW (ref 36.0–46.0)
Hemoglobin: 11.1 g/dL — ABNORMAL LOW (ref 12.0–15.0)
MCH: 30.8 pg (ref 26.0–34.0)
MCHC: 31.7 g/dL (ref 30.0–36.0)
MCV: 97.2 fL (ref 80.0–100.0)
Platelets: 229 10*3/uL (ref 150–400)
RBC: 3.6 MIL/uL — ABNORMAL LOW (ref 3.87–5.11)
RDW: 13.7 % (ref 11.5–15.5)
WBC: 4.4 10*3/uL (ref 4.0–10.5)
nRBC: 0 % (ref 0.0–0.2)

## 2023-02-20 LAB — BASIC METABOLIC PANEL
Anion gap: 9 (ref 5–15)
BUN: 26 mg/dL — ABNORMAL HIGH (ref 8–23)
CO2: 27 mmol/L (ref 22–32)
Calcium: 9.6 mg/dL (ref 8.9–10.3)
Chloride: 104 mmol/L (ref 98–111)
Creatinine, Ser: 1.56 mg/dL — ABNORMAL HIGH (ref 0.44–1.00)
GFR, Estimated: 35 mL/min — ABNORMAL LOW (ref >60)
Glucose, Bld: 97 mg/dL (ref 70–99)
Potassium: 3.8 mmol/L (ref 3.5–5.1)
Sodium: 140 mmol/L (ref 135–145)

## 2023-02-20 LAB — FERRITIN: Ferritin: 102 ng/mL (ref 11–307)

## 2023-02-20 LAB — IRON AND TIBC
Iron: 118 ug/dL (ref 28–170)
Saturation Ratios: 23 % (ref 10.4–31.8)
TIBC: 504 ug/dL — ABNORMAL HIGH (ref 250–450)
UIBC: 386 ug/dL

## 2023-02-20 LAB — BRAIN NATRIURETIC PEPTIDE: B Natriuretic Peptide: 220.6 pg/mL — ABNORMAL HIGH (ref 0.0–100.0)

## 2023-02-20 NOTE — Patient Instructions (Signed)
PLEASE leave your room 3 times a day  Labs done today, your results will be available in MyChart, we will contact you for abnormal readings.  Ultra sound of your left leg has been ordered.  Your physician recommends that you schedule a follow-up appointment in: 6 months ( March 2025) ** please call the office in January to arrange your follow up appointment. **  If you have any questions or concerns before your next appointment please send [place] a message through [place] or call our office at [phone].    TO LEAVE A MESSAGE FOR THE NURSE SELECT OPTION 2, PLEASE LEAVE A MESSAGE INCLUDING: YOUR NAME DATE OF BIRTH CALL BACK NUMBER REASON FOR CALL**this is important as we prioritize the call backs  YOU WILL RECEIVE A CALL BACK THE SAME DAY AS LONG AS YOU CALL BEFORE 4:00 PM  At the [facility], you and your health needs are our priority. As part of our continuing mission to provide you with exceptional heart care, we have created designated Provider Care Teams. These Care Teams include your primary Cardiologist (physician) and Advanced Practice Providers (APPs- Physician Assistants and Nurse Practitioners) who all work together to provide you with the care you need, when you need it.   You may see any of the following providers on your designated Care Team at your next follow up: Dr Arvilla Meres Dr Marca Ancona Dr. Marcos Eke, NP Robbie Lis, [place] [facility] [place], [place] Brynda Peon, NP Karle Plumber, PharmD   Please be sure to bring in all your medications bottles to every appointment.    Thank you for choosing [facility]

## 2023-02-22 NOTE — Progress Notes (Signed)
Advanced Heart Failure Clinic Note  PCP: Dr Abner Greenspan  Primary Cardiologist: Dr Katrinka Blazing  HF Cardiology: Dr. Shirlee Latch  HPI: [name] is a 72 y.o. female with a hx of bicuspid AV/AS with ascending aortic aneurysm s/p resection/grafting of ascending aortic aneurysm and placement of St. Jude mechanical AV conduit/Bentall 2009, persistent atrial fib, bradycardia s/p [employer]  PPM implantation 12/2019, CVAs, multiple falls with prior craniotomy for subdural hematoma, prior chronic CVAs on MRI 2021, bipolar disorder, anemia, depression, debilitation (wheelchair bound), DM, HLD, HTN, mild carotid artery disease 2021, back pain, obesity, memory impairment, and chronic HFrEF.   Admitted 06/2021 with increased shortness of breath in the setting of acute HFrEF. Diuresed with IV lasix. Echo repeated EF 25-30%. Had cath with patent coronaries. Started on [place]. Discharged to [facility].   She appears to have gone back into atrial fibrillation some time between 5/22 and 12/22 after amiodarone was stopped. She has had 3 bad falls (12/21, 2/22, 4/22) resulting in subdural hematomas.  She is, however, still on warfarin.    Follow up 4/23, NYHA II-III, Entresto increased. Remained in AF and arranged for DCCV. S/p TEE/DCCV 09/23/21 to NSR.  Patient was admitted in 8/24 with AKI, meds were cut back.   Echo was done today and reviewed, EF 55-60%, mild asymmetric septal hypertrophy, mild RV dysfunction, mechanical aortic valve mean gradient 9 mmHg, IVC normal.   Today she returns for HF follow up with her daughter. She is limited by low back pain and knee/hip pain and does minimal walking.  Generally in a wheelchair. Not short of breath with transfers.  She has a poorly healing wound on her left ankle.  Generally fatigued/tired. Weight down 3 lbs.   ECG (personally reviewed): a-paced, long PR.   Labs (2/23): K 4.1, creatinine 1.68, hgb 10.8 Labs (3/23): K 4.2, creatinine 1.69, hgb 10.8, LFTs  normal, TSH normal.  Labs (4/23): K 4.3, creatinine 1.62 Labs (2/24): K 4.5, creatinine 1.56, BNP 116 Labs (8/24): K 3.8, creatinine 1.6, hgb 10.8  PMH: 1. Bicuspid aortic valve with ascending aortic aneurysm: S/p St Jude mechanical aortic valve with Bentall procedure in 2009 2. Atrial fibrillation: Persistent. DCCV to NSR in 12/21.  Had maintained NSR on amiodarone but was stopped.  - s/p TEE/DCCV 5/23 to NSR 3. Bradycardia: [employer] PPM with left bundle lead in 3/21.  4. CVAs: Now wheelchair-bound.  5. Multiple falls: 12/21 with subdural hematoma requiring craniotomy, 2/22 with subdural, 4/22 with subdural.  6. Bipolar disorder 7. Dementia 8. Hypothyroidism 9. CKD stage 3 10. Chronic systolic CHF: Nonischemic cardiomyopathy.  - Echo (11/22): EF 40-45%.  - LHC/RHC (2/23): No CAD; mean RA 12, PA 45/22, PCWP mean 25, CI 3.5 - Echo (2/23): EF 25-30%, global HK, RV normal, mild MR, St Jude mechanical AVR with normal function.  - TEE (5/23): EF 35-40%, RV mildly reduced, mild to moderate MR, St Jude mechanical AVR with normal function. - Echo (9/24): EF 55-60%, mild asymmetric septal hypertrophy, mild RV dysfunction, mechanical aortic valve mean gradient 9 mmHg, IVC normal.  11. Hyperlipidemia 12. CKD stage 3 13. L-spine athritis   Review of Systems: All systems reviewed and negative except as per HPI.   Current Outpatient Medications  Medication Sig Dispense Refill   acetaminophen (TYLENOL) 500 MG tablet Take 500 mg by mouth every 6 (six) hours.     amiodarone (PACERONE) 200 MG tablet Take 200 mg by mouth daily.     carvedilol (  COREG) 6.25 MG tablet Take 6.25 mg by mouth 2 (two) times daily.     dapagliflozin propanediol (FARXIGA) 10 MG TABS tablet Take 1 tablet (10 mg total) by mouth daily. 30 tablet 0   estradiol (ESTRACE) 0.1 MG/GM vaginal cream Place 1 Applicatorful vaginally every Monday, Wednesday, and Friday.     fenofibrate 160 MG tablet Take 1 tablet (160 mg total)  by mouth daily.     fexofenadine (ALLEGRA) 60 MG tablet Take 60 mg by mouth daily.     fluticasone (FLONASE) 50 MCG/ACT nasal spray Place 1 spray into both nostrils 2 (two) times daily.     furosemide (LASIX) 20 MG tablet Take 1 tablet (20 mg total) by mouth daily. 30 tablet 0   HYDROcodone-acetaminophen (NORCO/VICODIN) 5-325 MG tablet Take 1 tablet by mouth every 6 (six) hours. 30 tablet 0   hydrOXYzine (ATARAX) 10 MG tablet Take 10 mg by mouth at bedtime.     lamoTRIgine (LAMICTAL) 100 MG tablet Take 100 mg by mouth daily.     lamoTRIgine (LAMICTAL) 200 MG tablet Take 200 mg by mouth at bedtime.     levalbuterol (XOPENEX) 0.63 MG/3ML nebulizer solution Take 0.63 mg by nebulization 2 (two) times daily.     levothyroxine (SYNTHROID) 50 MCG tablet Take 50 mcg by mouth daily before breakfast.     lidocaine 4 % Place 1 patch onto the skin daily. To left lower back     meclizine (ANTIVERT) 25 MG tablet Take 25 mg by mouth daily as needed for dizziness (vertigo).     melatonin 5 MG TABS Take 10 mg by mouth at bedtime.     methocarbamol (ROBAXIN) 500 MG tablet Take 500 mg by mouth 3 (three) times daily.     pantoprazole (PROTONIX) 20 MG tablet Take 20 mg by mouth every other day.     potassium chloride SA (KLOR-CON M) 20 MEQ tablet Take 1 tablet (20 mEq total) by mouth daily.     pregabalin (LYRICA) 50 MG capsule Take 50 mg by mouth 3 (three) times daily.     rosuvastatin (CRESTOR) 40 MG tablet Take 40 mg by mouth at bedtime.     sacubitril-valsartan (ENTRESTO) 24-26 MG Take 1 tablet by mouth 2 (two) times daily.     spironolactone (ALDACTONE) 25 MG tablet Take 0.5 tablets (12.5 mg total) by mouth daily. 30 tablet 0   traZODone (DESYREL) 150 MG tablet Take 1 tablet (150 mg total) by mouth at bedtime. 30 tablet 0   venlafaxine (EFFEXOR) 75 MG tablet Take 75 mg by mouth daily.     vitamin B-12 (CYANOCOBALAMIN) 1000 MCG tablet Take 1,000 mcg by mouth every Monday.     warfarin (COUMADIN) 2 MG tablet  Take 2 mg by mouth See admin instructions. 2 mg once a day in the evenings on Tuesday, Thursday, Saturday.     warfarin (COUMADIN) 3 MG tablet Take 1.5 mg by mouth See admin instructions. 1.5 mg (1/2 tablet) once a day in the evenings on Sunday, Monday, Wednesday, Friday     No current facility-administered medications for this encounter.   Allergies  Allergen Reactions   Mucinex [Guaifenesin Er] Other (See Comments)    Severe headaches    Keflex [Cephalexin] Other (See Comments)    Headaches Dizziness    Mucinex [Guaifenesin Er] Other (See Comments)    Severe headaches   Sulfa Antibiotics Other (See Comments)    Headaches    Sulfonamide Derivatives Other (See Comments)    Headaches  Social History   Socioeconomic History   Marital status: Married    Spouse name: Not on file   Number of children: Not on file   Years of education: Not on file   Highest education level: Not on file  Occupational History   Not on file  Tobacco Use   Smoking status: Former    Current packs/day: 0.00    Types: Cigarettes    Quit date: 05/26/1990    Years since quitting: 32.7   Smokeless tobacco: Never  Vaping Use   Vaping status: Never Used  Substance and Sexual Activity   Alcohol use: Yes    Alcohol/week: 0.0 standard drinks of alcohol    Comment: rare   Drug use: No   Sexual activity: Not on file  Other Topics Concern   Not on file  Social History Narrative   ** Merged History Encounter **       Social Determinants of Health   Financial Resource Strain: Not on file  Food Insecurity: No Food Insecurity (01/09/2023)   Hunger Vital Sign    Worried About Running Out of Food in the Last Year: Never true    Ran Out of Food in the Last Year: Never true  Transportation Needs: No Transportation Needs (01/09/2023)   PRAPARE - Administrator, Civil Service (Medical): No    Lack of Transportation (Non-Medical): No  Physical Activity: Not on file  Stress: Not on file  Social  Connections: Not on file  Intimate Partner Violence: Not At Risk (01/09/2023)   Humiliation, Afraid, Rape, and Kick questionnaire    Fear of Current or Ex-Partner: No    Emotionally Abused: No    Physically Abused: No    Sexually Abused: No   Family History  Problem Relation Age of Onset   Scoliosis Mother    Arthritis Mother        Rheumatoid   Aneurysm Mother        heart   Alcohol abuse Father    Depression Sister    Depression Brother    Alcohol abuse Brother    Cancer Maternal Grandmother        colon   Diabetes Maternal Grandfather    Heart disease Maternal Grandfather    Alcohol abuse Maternal Grandfather    Depression Paternal Grandmother    Diabetes Paternal Grandmother    Depression Paternal Grandfather    Diabetes Paternal Grandfather    Depression Sister    Alcohol abuse Brother    Depression Brother    Heart disease Brother    BP 104/60   Pulse 60   Wt 107.5 kg (237 lb)   SpO2 99%   BMI 36.04 kg/m   Wt Readings from Last 3 Encounters:  02/20/23 107.5 kg (237 lb)  01/12/23 109 kg (240 lb 4.8 oz)  11/21/22 109.1 kg (240 lb 9.6 oz)   General: NAD Neck: No JVD, no thyromegaly or thyroid nodule.  Lungs: Clear to auscultation bilaterally with normal respiratory effort. CV: Nondisplaced PMI.  Heart regular S1/S2 with mechanical S2, no S3/S4, 2/6 SEM RUSB.  2+ left ankle edema.  No carotid bruit. Difficult to palpate pedal pulses.  Abdomen: Soft, nontender, no hepatosplenomegaly, no distention.  Skin: Intact without lesions or rashes.  Neurologic: Alert and oriented x 3.  Psych: Normal affect. Extremities: No clubbing or cyanosis. Unhealed wound on left ankle.  HEENT: Normal.   ASSESSMENT & PLAN: 1. Chronic systolic CHF: Nonischemic cardiomyopathy.  Cath 06/2020 without significant  coronary disease.  06/2020 echo with EF 25-30%, previous echo 03/2021 with EF 40-45%. She has a Environmental manager PPM but rarely paces her RV.  TEE (5/23) EF 35-40%. Echo today  showed improved EF 55-60%, mild asymmetric septal hypertrophy, mild RV dysfunction, mechanical aortic valve mean gradient 9 mmHg, IVC normal. She is not volume overloaded on exam.  NYHA class III symptoms but seems most limited by deconditioning and orthopedic problems.  I will not increase her meds with improved EF and soft BP.  - Continue Lasix 20 mg daily.   BMET/BNP today.  - Continue carvedilol 6.25 mg bid. - Continue Entresto 24/26 mg bid.  - Continue spironolactone 12.5 mg daily.  - Continue Farxiga 10 mg daily.  2. CKD Stage III: Recent AKI. BMET today.  3. Atrial fibrillation: Paroxysmal.  Patient was in NSR in 5/22 but was in atrial fibrillation in 2/23. Her worsening HF may be due at least in part to development of persistent AF. S/p TEE/DCCV to NSR 5/23. She remains in NSR today.  - Continue amiodarone 200 mg daily. Check LFTs and TSH. She will need a regular eye exam.  - Continue warfarin.  4. Mechanical Aortic Valve: On warfarin, goal INR 2.5-3.5 with h/o atrial fibrillation.  Stable valve on echo today. 5. Tachy/Brady Syndrome: [employer] PPM, she has a left bundle lead, rarely paces.   6. Falls: Frequent in past with SDH.  Now in SNF, seems to be doing better with no recent falls but minimally mobile.  She has remained on warfarin given mechanical AoV.  7. Poorly healing left ankle ulcer: Will get ABIs to assess for significant PAD.  8. Anemia: Check Fe studies.   Follow up in 6 months with APP.   Marca Ancona  02/22/2023

## 2023-02-24 DIAGNOSIS — S91002A Unspecified open wound, left ankle, initial encounter: Secondary | ICD-10-CM | POA: Diagnosis not present

## 2023-02-25 ENCOUNTER — Encounter: Payer: Self-pay | Admitting: Internal Medicine

## 2023-02-25 ENCOUNTER — Other Ambulatory Visit (HOSPITAL_COMMUNITY): Payer: Self-pay | Admitting: Adult Health

## 2023-02-25 DIAGNOSIS — M25572 Pain in left ankle and joints of left foot: Secondary | ICD-10-CM

## 2023-02-25 DIAGNOSIS — Z4789 Encounter for other orthopedic aftercare: Secondary | ICD-10-CM

## 2023-02-26 NOTE — Progress Notes (Signed)
Remote pacemaker transmission.   

## 2023-03-03 DIAGNOSIS — S91002A Unspecified open wound, left ankle, initial encounter: Secondary | ICD-10-CM | POA: Diagnosis not present

## 2023-03-05 DIAGNOSIS — Z7901 Long term (current) use of anticoagulants: Secondary | ICD-10-CM | POA: Diagnosis not present

## 2023-03-05 DIAGNOSIS — M47816 Spondylosis without myelopathy or radiculopathy, lumbar region: Secondary | ICD-10-CM | POA: Diagnosis not present

## 2023-03-05 DIAGNOSIS — R296 Repeated falls: Secondary | ICD-10-CM | POA: Diagnosis not present

## 2023-03-05 DIAGNOSIS — Z8673 Personal history of transient ischemic attack (TIA), and cerebral infarction without residual deficits: Secondary | ICD-10-CM | POA: Diagnosis not present

## 2023-03-05 DIAGNOSIS — I10 Essential (primary) hypertension: Secondary | ICD-10-CM | POA: Diagnosis not present

## 2023-03-05 DIAGNOSIS — E1122 Type 2 diabetes mellitus with diabetic chronic kidney disease: Secondary | ICD-10-CM | POA: Diagnosis not present

## 2023-03-05 DIAGNOSIS — F319 Bipolar disorder, unspecified: Secondary | ICD-10-CM | POA: Diagnosis not present

## 2023-03-05 DIAGNOSIS — R262 Difficulty in walking, not elsewhere classified: Secondary | ICD-10-CM | POA: Diagnosis not present

## 2023-03-05 DIAGNOSIS — R4189 Other symptoms and signs involving cognitive functions and awareness: Secondary | ICD-10-CM | POA: Diagnosis not present

## 2023-03-05 DIAGNOSIS — I482 Chronic atrial fibrillation, unspecified: Secondary | ICD-10-CM | POA: Diagnosis not present

## 2023-03-05 DIAGNOSIS — N1831 Chronic kidney disease, stage 3a: Secondary | ICD-10-CM | POA: Diagnosis not present

## 2023-03-05 DIAGNOSIS — Z87828 Personal history of other (healed) physical injury and trauma: Secondary | ICD-10-CM | POA: Diagnosis not present

## 2023-03-10 DIAGNOSIS — S91002A Unspecified open wound, left ankle, initial encounter: Secondary | ICD-10-CM | POA: Diagnosis not present

## 2023-03-12 DIAGNOSIS — I482 Chronic atrial fibrillation, unspecified: Secondary | ICD-10-CM | POA: Diagnosis not present

## 2023-03-16 DIAGNOSIS — F3132 Bipolar disorder, current episode depressed, moderate: Secondary | ICD-10-CM | POA: Diagnosis not present

## 2023-03-16 DIAGNOSIS — F5101 Primary insomnia: Secondary | ICD-10-CM | POA: Diagnosis not present

## 2023-03-17 DIAGNOSIS — S91002A Unspecified open wound, left ankle, initial encounter: Secondary | ICD-10-CM | POA: Diagnosis not present

## 2023-03-24 DIAGNOSIS — S91002A Unspecified open wound, left ankle, initial encounter: Secondary | ICD-10-CM | POA: Diagnosis not present

## 2023-03-26 DIAGNOSIS — F5104 Psychophysiologic insomnia: Secondary | ICD-10-CM | POA: Diagnosis not present

## 2023-03-30 ENCOUNTER — Ambulatory Visit (HOSPITAL_COMMUNITY): Payer: PPO

## 2023-03-31 DIAGNOSIS — S91002A Unspecified open wound, left ankle, initial encounter: Secondary | ICD-10-CM | POA: Diagnosis not present

## 2023-04-07 DIAGNOSIS — S91002A Unspecified open wound, left ankle, initial encounter: Secondary | ICD-10-CM | POA: Diagnosis not present

## 2023-04-08 ENCOUNTER — Encounter: Payer: Self-pay | Admitting: Psychiatry

## 2023-04-15 DIAGNOSIS — F3132 Bipolar disorder, current episode depressed, moderate: Secondary | ICD-10-CM | POA: Diagnosis not present

## 2023-04-15 DIAGNOSIS — F5101 Primary insomnia: Secondary | ICD-10-CM | POA: Diagnosis not present

## 2023-05-01 DIAGNOSIS — I482 Chronic atrial fibrillation, unspecified: Secondary | ICD-10-CM | POA: Diagnosis not present

## 2023-05-01 DIAGNOSIS — E1122 Type 2 diabetes mellitus with diabetic chronic kidney disease: Secondary | ICD-10-CM | POA: Diagnosis not present

## 2023-05-01 DIAGNOSIS — D631 Anemia in chronic kidney disease: Secondary | ICD-10-CM | POA: Diagnosis not present

## 2023-05-01 DIAGNOSIS — F319 Bipolar disorder, unspecified: Secondary | ICD-10-CM | POA: Diagnosis not present

## 2023-05-01 DIAGNOSIS — E039 Hypothyroidism, unspecified: Secondary | ICD-10-CM | POA: Diagnosis not present

## 2023-05-01 DIAGNOSIS — I10 Essential (primary) hypertension: Secondary | ICD-10-CM | POA: Diagnosis not present

## 2023-05-01 DIAGNOSIS — R296 Repeated falls: Secondary | ICD-10-CM | POA: Diagnosis not present

## 2023-05-01 DIAGNOSIS — N1831 Chronic kidney disease, stage 3a: Secondary | ICD-10-CM | POA: Diagnosis not present

## 2023-05-01 DIAGNOSIS — Z7984 Long term (current) use of oral hypoglycemic drugs: Secondary | ICD-10-CM | POA: Diagnosis not present

## 2023-05-01 DIAGNOSIS — M47816 Spondylosis without myelopathy or radiculopathy, lumbar region: Secondary | ICD-10-CM | POA: Diagnosis not present

## 2023-05-05 DIAGNOSIS — D631 Anemia in chronic kidney disease: Secondary | ICD-10-CM | POA: Diagnosis not present

## 2023-05-05 DIAGNOSIS — M5430 Sciatica, unspecified side: Secondary | ICD-10-CM | POA: Diagnosis not present

## 2023-05-05 DIAGNOSIS — N1831 Chronic kidney disease, stage 3a: Secondary | ICD-10-CM | POA: Diagnosis not present

## 2023-05-05 DIAGNOSIS — Z7984 Long term (current) use of oral hypoglycemic drugs: Secondary | ICD-10-CM | POA: Diagnosis not present

## 2023-05-05 DIAGNOSIS — F5104 Psychophysiologic insomnia: Secondary | ICD-10-CM | POA: Diagnosis not present

## 2023-05-05 DIAGNOSIS — E039 Hypothyroidism, unspecified: Secondary | ICD-10-CM | POA: Diagnosis not present

## 2023-05-05 DIAGNOSIS — M47816 Spondylosis without myelopathy or radiculopathy, lumbar region: Secondary | ICD-10-CM | POA: Diagnosis not present

## 2023-05-05 DIAGNOSIS — E1122 Type 2 diabetes mellitus with diabetic chronic kidney disease: Secondary | ICD-10-CM | POA: Diagnosis not present

## 2023-05-05 DIAGNOSIS — G8929 Other chronic pain: Secondary | ICD-10-CM | POA: Diagnosis not present

## 2023-05-05 DIAGNOSIS — E119 Type 2 diabetes mellitus without complications: Secondary | ICD-10-CM | POA: Diagnosis not present

## 2023-05-12 ENCOUNTER — Ambulatory Visit (HOSPITAL_COMMUNITY)
Admission: RE | Admit: 2023-05-12 | Discharge: 2023-05-12 | Disposition: A | Discharge status: Home / Self Care | Payer: PPO | Source: Ambulatory Visit | Attending: Cardiology | Admitting: Cardiology

## 2023-05-12 ENCOUNTER — Ambulatory Visit (INDEPENDENT_AMBULATORY_CARE_PROVIDER_SITE_OTHER): Payer: PPO

## 2023-05-12 DIAGNOSIS — S91002D Unspecified open wound, left ankle, subsequent encounter: Secondary | ICD-10-CM | POA: Insufficient documentation

## 2023-05-12 DIAGNOSIS — I4891 Unspecified atrial fibrillation: Secondary | ICD-10-CM | POA: Diagnosis not present

## 2023-05-12 DIAGNOSIS — I4892 Unspecified atrial flutter: Secondary | ICD-10-CM

## 2023-05-12 DIAGNOSIS — S91009D Unspecified open wound, unspecified ankle, subsequent encounter: Secondary | ICD-10-CM | POA: Insufficient documentation

## 2023-05-12 LAB — CUP PACEART REMOTE DEVICE CHECK
Battery Remaining Longevity: 150 mo
Battery Remaining Percentage: 100 %
Brady Statistic RA Percent Paced: 46 %
Brady Statistic RV Percent Paced: 11 %
Date Time Interrogation Session: 20241217023100
Implantable Lead Connection Status: 753985
Implantable Lead Connection Status: 753985
Implantable Lead Implant Date: 20210816
Implantable Lead Implant Date: 20210816
Implantable Lead Location: 753859
Implantable Lead Location: 753860
Implantable Lead Model: 7842
Implantable Lead Model: 7842
Implantable Lead Serial Number: 1013287
Implantable Lead Serial Number: 1091261
Implantable Pulse Generator Implant Date: 20210816
Lead Channel Impedance Value: 430 Ohm
Lead Channel Impedance Value: 703 Ohm
Lead Channel Setting Pacing Amplitude: 2.5 V
Lead Channel Setting Pacing Amplitude: 2.5 V
Lead Channel Setting Pacing Pulse Width: 0.4 ms
Lead Channel Setting Sensing Sensitivity: 2.5 mV
Pulse Gen Serial Number: 940432
Zone Setting Status: 755011

## 2023-05-13 LAB — VAS US ABI WITH/WO TBI
Left ABI: 1.07
Right ABI: 1.05

## 2023-05-15 DIAGNOSIS — M47816 Spondylosis without myelopathy or radiculopathy, lumbar region: Secondary | ICD-10-CM | POA: Diagnosis not present

## 2023-05-15 DIAGNOSIS — R296 Repeated falls: Secondary | ICD-10-CM | POA: Diagnosis not present

## 2023-05-15 DIAGNOSIS — Z7984 Long term (current) use of oral hypoglycemic drugs: Secondary | ICD-10-CM | POA: Diagnosis not present

## 2023-05-15 DIAGNOSIS — I5042 Chronic combined systolic (congestive) and diastolic (congestive) heart failure: Secondary | ICD-10-CM | POA: Diagnosis not present

## 2023-05-15 DIAGNOSIS — G8929 Other chronic pain: Secondary | ICD-10-CM | POA: Diagnosis not present

## 2023-05-15 DIAGNOSIS — M5430 Sciatica, unspecified side: Secondary | ICD-10-CM | POA: Diagnosis not present

## 2023-05-21 DIAGNOSIS — I5042 Chronic combined systolic (congestive) and diastolic (congestive) heart failure: Secondary | ICD-10-CM | POA: Diagnosis not present

## 2023-05-21 DIAGNOSIS — S91002A Unspecified open wound, left ankle, initial encounter: Secondary | ICD-10-CM | POA: Diagnosis not present

## 2023-05-21 DIAGNOSIS — L309 Dermatitis, unspecified: Secondary | ICD-10-CM | POA: Diagnosis not present

## 2023-05-21 DIAGNOSIS — N1832 Chronic kidney disease, stage 3b: Secondary | ICD-10-CM | POA: Diagnosis not present

## 2023-05-21 DIAGNOSIS — M5430 Sciatica, unspecified side: Secondary | ICD-10-CM | POA: Diagnosis not present

## 2023-05-21 DIAGNOSIS — M47816 Spondylosis without myelopathy or radiculopathy, lumbar region: Secondary | ICD-10-CM | POA: Diagnosis not present

## 2023-05-26 DIAGNOSIS — N1831 Chronic kidney disease, stage 3a: Secondary | ICD-10-CM | POA: Diagnosis not present

## 2023-06-08 DIAGNOSIS — R635 Abnormal weight gain: Secondary | ICD-10-CM | POA: Diagnosis not present

## 2023-06-08 DIAGNOSIS — R600 Localized edema: Secondary | ICD-10-CM | POA: Diagnosis not present

## 2023-06-10 ENCOUNTER — Ambulatory Visit (HOSPITAL_COMMUNITY)
Admission: RE | Admit: 2023-06-10 | Discharge: 2023-06-10 | Disposition: A | Discharge status: Home / Self Care | Payer: PPO | Source: Ambulatory Visit | Attending: Adult Health | Admitting: Adult Health

## 2023-06-10 DIAGNOSIS — M948X7 Other specified disorders of cartilage, ankle and foot: Secondary | ICD-10-CM | POA: Diagnosis not present

## 2023-06-10 DIAGNOSIS — Z4789 Encounter for other orthopedic aftercare: Secondary | ICD-10-CM | POA: Diagnosis not present

## 2023-06-10 DIAGNOSIS — M25572 Pain in left ankle and joints of left foot: Secondary | ICD-10-CM | POA: Diagnosis not present

## 2023-06-10 DIAGNOSIS — M778 Other enthesopathies, not elsewhere classified: Secondary | ICD-10-CM | POA: Diagnosis not present

## 2023-06-11 DIAGNOSIS — E039 Hypothyroidism, unspecified: Secondary | ICD-10-CM | POA: Diagnosis not present

## 2023-06-11 DIAGNOSIS — I110 Hypertensive heart disease with heart failure: Secondary | ICD-10-CM | POA: Diagnosis not present

## 2023-06-11 DIAGNOSIS — R296 Repeated falls: Secondary | ICD-10-CM | POA: Diagnosis not present

## 2023-06-11 DIAGNOSIS — F319 Bipolar disorder, unspecified: Secondary | ICD-10-CM | POA: Diagnosis not present

## 2023-06-11 DIAGNOSIS — I5042 Chronic combined systolic (congestive) and diastolic (congestive) heart failure: Secondary | ICD-10-CM | POA: Diagnosis not present

## 2023-06-11 DIAGNOSIS — N1832 Chronic kidney disease, stage 3b: Secondary | ICD-10-CM | POA: Diagnosis not present

## 2023-06-11 DIAGNOSIS — F5104 Psychophysiologic insomnia: Secondary | ICD-10-CM | POA: Diagnosis not present

## 2023-06-11 DIAGNOSIS — R635 Abnormal weight gain: Secondary | ICD-10-CM | POA: Diagnosis not present

## 2023-06-11 DIAGNOSIS — E1122 Type 2 diabetes mellitus with diabetic chronic kidney disease: Secondary | ICD-10-CM | POA: Diagnosis not present

## 2023-06-11 DIAGNOSIS — L309 Dermatitis, unspecified: Secondary | ICD-10-CM | POA: Diagnosis not present

## 2023-06-11 DIAGNOSIS — I482 Chronic atrial fibrillation, unspecified: Secondary | ICD-10-CM | POA: Diagnosis not present

## 2023-06-11 DIAGNOSIS — M47816 Spondylosis without myelopathy or radiculopathy, lumbar region: Secondary | ICD-10-CM | POA: Diagnosis not present

## 2023-06-15 DIAGNOSIS — F3132 Bipolar disorder, current episode depressed, moderate: Secondary | ICD-10-CM | POA: Diagnosis not present

## 2023-06-15 DIAGNOSIS — F5101 Primary insomnia: Secondary | ICD-10-CM | POA: Diagnosis not present

## 2023-06-19 NOTE — Progress Notes (Signed)
Remote pacemaker transmission.

## 2023-06-20 DIAGNOSIS — R109 Unspecified abdominal pain: Secondary | ICD-10-CM | POA: Diagnosis not present

## 2023-06-20 DIAGNOSIS — M25551 Pain in right hip: Secondary | ICD-10-CM | POA: Diagnosis not present

## 2023-06-20 DIAGNOSIS — M25552 Pain in left hip: Secondary | ICD-10-CM | POA: Diagnosis not present

## 2023-07-13 DIAGNOSIS — I482 Chronic atrial fibrillation, unspecified: Secondary | ICD-10-CM | POA: Diagnosis not present

## 2023-07-17 DIAGNOSIS — N1831 Chronic kidney disease, stage 3a: Secondary | ICD-10-CM | POA: Diagnosis not present

## 2023-07-17 DIAGNOSIS — H6123 Impacted cerumen, bilateral: Secondary | ICD-10-CM | POA: Diagnosis not present

## 2023-07-17 DIAGNOSIS — I5042 Chronic combined systolic (congestive) and diastolic (congestive) heart failure: Secondary | ICD-10-CM | POA: Diagnosis not present

## 2023-07-17 DIAGNOSIS — M5442 Lumbago with sciatica, left side: Secondary | ICD-10-CM | POA: Diagnosis not present

## 2023-07-17 DIAGNOSIS — E785 Hyperlipidemia, unspecified: Secondary | ICD-10-CM | POA: Diagnosis not present

## 2023-07-17 DIAGNOSIS — Z79899 Other long term (current) drug therapy: Secondary | ICD-10-CM | POA: Diagnosis not present

## 2023-07-24 DIAGNOSIS — I501 Left ventricular failure: Secondary | ICD-10-CM | POA: Diagnosis not present

## 2023-07-24 DIAGNOSIS — I10 Essential (primary) hypertension: Secondary | ICD-10-CM | POA: Diagnosis not present

## 2023-07-27 DIAGNOSIS — F3132 Bipolar disorder, current episode depressed, moderate: Secondary | ICD-10-CM | POA: Diagnosis not present

## 2023-07-27 DIAGNOSIS — F5104 Psychophysiologic insomnia: Secondary | ICD-10-CM | POA: Diagnosis not present

## 2023-07-28 DIAGNOSIS — I10 Essential (primary) hypertension: Secondary | ICD-10-CM | POA: Diagnosis not present

## 2023-07-31 DIAGNOSIS — R509 Fever, unspecified: Secondary | ICD-10-CM | POA: Diagnosis not present

## 2023-08-03 DIAGNOSIS — I130 Hypertensive heart and chronic kidney disease with heart failure and stage 1 through stage 4 chronic kidney disease, or unspecified chronic kidney disease: Secondary | ICD-10-CM | POA: Diagnosis not present

## 2023-08-03 DIAGNOSIS — E1122 Type 2 diabetes mellitus with diabetic chronic kidney disease: Secondary | ICD-10-CM | POA: Diagnosis not present

## 2023-08-03 DIAGNOSIS — I482 Chronic atrial fibrillation, unspecified: Secondary | ICD-10-CM | POA: Diagnosis not present

## 2023-08-03 DIAGNOSIS — M5430 Sciatica, unspecified side: Secondary | ICD-10-CM | POA: Diagnosis not present

## 2023-08-03 DIAGNOSIS — E785 Hyperlipidemia, unspecified: Secondary | ICD-10-CM | POA: Diagnosis not present

## 2023-08-03 DIAGNOSIS — F5104 Psychophysiologic insomnia: Secondary | ICD-10-CM | POA: Diagnosis not present

## 2023-08-03 DIAGNOSIS — N1832 Chronic kidney disease, stage 3b: Secondary | ICD-10-CM | POA: Diagnosis not present

## 2023-08-03 DIAGNOSIS — Z7901 Long term (current) use of anticoagulants: Secondary | ICD-10-CM | POA: Diagnosis not present

## 2023-08-03 DIAGNOSIS — I5042 Chronic combined systolic (congestive) and diastolic (congestive) heart failure: Secondary | ICD-10-CM | POA: Diagnosis not present

## 2023-08-03 DIAGNOSIS — F319 Bipolar disorder, unspecified: Secondary | ICD-10-CM | POA: Diagnosis not present

## 2023-08-03 DIAGNOSIS — E039 Hypothyroidism, unspecified: Secondary | ICD-10-CM | POA: Diagnosis not present

## 2023-08-03 DIAGNOSIS — M4306 Spondylolysis, lumbar region: Secondary | ICD-10-CM | POA: Diagnosis not present

## 2023-08-04 ENCOUNTER — Ambulatory Visit: Payer: PPO | Admitting: Internal Medicine

## 2023-08-05 DIAGNOSIS — H26493 Other secondary cataract, bilateral: Secondary | ICD-10-CM | POA: Diagnosis not present

## 2023-08-05 DIAGNOSIS — Z961 Presence of intraocular lens: Secondary | ICD-10-CM | POA: Diagnosis not present

## 2023-08-05 DIAGNOSIS — H43823 Vitreomacular adhesion, bilateral: Secondary | ICD-10-CM | POA: Diagnosis not present

## 2023-08-05 DIAGNOSIS — H04123 Dry eye syndrome of bilateral lacrimal glands: Secondary | ICD-10-CM | POA: Diagnosis not present

## 2023-08-08 DIAGNOSIS — E119 Type 2 diabetes mellitus without complications: Secondary | ICD-10-CM | POA: Diagnosis not present

## 2023-08-11 ENCOUNTER — Other Ambulatory Visit: Payer: Self-pay

## 2023-08-11 ENCOUNTER — Emergency Department (HOSPITAL_COMMUNITY)

## 2023-08-11 ENCOUNTER — Emergency Department (HOSPITAL_COMMUNITY)
Admission: EM | Admit: 2023-08-11 | Discharge: 2023-08-11 | Disposition: A | Discharge status: Skilled Nursing Facility | Attending: Emergency Medicine | Admitting: Emergency Medicine

## 2023-08-11 ENCOUNTER — Ambulatory Visit (INDEPENDENT_AMBULATORY_CARE_PROVIDER_SITE_OTHER): Payer: HMO

## 2023-08-11 DIAGNOSIS — W19XXXA Unspecified fall, initial encounter: Secondary | ICD-10-CM | POA: Diagnosis not present

## 2023-08-11 DIAGNOSIS — W050XXA Fall from non-moving wheelchair, initial encounter: Secondary | ICD-10-CM | POA: Diagnosis not present

## 2023-08-11 DIAGNOSIS — I495 Sick sinus syndrome: Secondary | ICD-10-CM | POA: Diagnosis not present

## 2023-08-11 DIAGNOSIS — I6529 Occlusion and stenosis of unspecified carotid artery: Secondary | ICD-10-CM | POA: Diagnosis not present

## 2023-08-11 DIAGNOSIS — I672 Cerebral atherosclerosis: Secondary | ICD-10-CM | POA: Diagnosis not present

## 2023-08-11 DIAGNOSIS — Z7901 Long term (current) use of anticoagulants: Secondary | ICD-10-CM | POA: Insufficient documentation

## 2023-08-11 DIAGNOSIS — S0990XA Unspecified injury of head, initial encounter: Secondary | ICD-10-CM | POA: Diagnosis not present

## 2023-08-11 DIAGNOSIS — I10 Essential (primary) hypertension: Secondary | ICD-10-CM | POA: Diagnosis not present

## 2023-08-11 DIAGNOSIS — S0003XA Contusion of scalp, initial encounter: Secondary | ICD-10-CM | POA: Diagnosis not present

## 2023-08-11 DIAGNOSIS — I959 Hypotension, unspecified: Secondary | ICD-10-CM | POA: Diagnosis not present

## 2023-08-11 DIAGNOSIS — S199XXA Unspecified injury of neck, initial encounter: Secondary | ICD-10-CM | POA: Diagnosis not present

## 2023-08-11 LAB — COMPREHENSIVE METABOLIC PANEL
ALT: 18 U/L (ref 0–44)
AST: 30 U/L (ref 15–41)
Albumin: 3.7 g/dL (ref 3.5–5.0)
Alkaline Phosphatase: 25 U/L — ABNORMAL LOW (ref 38–126)
Anion gap: 7 (ref 5–15)
BUN: 20 mg/dL (ref 8–23)
CO2: 25 mmol/L (ref 22–32)
Calcium: 8.9 mg/dL (ref 8.9–10.3)
Chloride: 108 mmol/L (ref 98–111)
Creatinine, Ser: 1.36 mg/dL — ABNORMAL HIGH (ref 0.44–1.00)
GFR, Estimated: 41 mL/min — ABNORMAL LOW (ref >60)
Glucose, Bld: 111 mg/dL — ABNORMAL HIGH (ref 70–99)
Potassium: 4.9 mmol/L (ref 3.5–5.1)
Sodium: 140 mmol/L (ref 135–145)
Total Bilirubin: 0.7 mg/dL (ref 0.0–1.2)
Total Protein: 6.7 g/dL (ref 6.5–8.1)

## 2023-08-11 LAB — CBC WITH DIFFERENTIAL/PLATELET
Abs Immature Granulocytes: 0.01 10*3/uL (ref 0.00–0.07)
Basophils Absolute: 0.1 10*3/uL (ref 0.0–0.1)
Basophils Relative: 2 %
Eosinophils Absolute: 0.1 10*3/uL (ref 0.0–0.5)
Eosinophils Relative: 3 %
HCT: 37.3 % (ref 36.0–46.0)
Hemoglobin: 11.5 g/dL — ABNORMAL LOW (ref 12.0–15.0)
Immature Granulocytes: 0 %
Lymphocytes Relative: 16 %
Lymphs Abs: 0.8 10*3/uL (ref 0.7–4.0)
MCH: 31.1 pg (ref 26.0–34.0)
MCHC: 30.8 g/dL (ref 30.0–36.0)
MCV: 100.8 fL — ABNORMAL HIGH (ref 80.0–100.0)
Monocytes Absolute: 0.5 10*3/uL (ref 0.1–1.0)
Monocytes Relative: 9 %
Neutro Abs: 3.7 10*3/uL (ref 1.7–7.7)
Neutrophils Relative %: 70 %
Platelets: 247 10*3/uL (ref 150–400)
RBC: 3.7 MIL/uL — ABNORMAL LOW (ref 3.87–5.11)
RDW: 14.4 % (ref 11.5–15.5)
WBC: 5.2 10*3/uL (ref 4.0–10.5)
nRBC: 0 % (ref 0.0–0.2)

## 2023-08-11 LAB — PROTIME-INR
INR: 2.9 — ABNORMAL HIGH (ref 0.8–1.2)
Prothrombin Time: 30.8 s — ABNORMAL HIGH (ref 11.4–15.2)

## 2023-08-11 NOTE — ED Notes (Signed)
 Called Nursing home x2 with no answer

## 2023-08-11 NOTE — ED Provider Notes (Signed)
  Physical Exam  BP (!) 146/66   Pulse 74   Temp 97.8 F (36.6 C) (Oral)   Resp (!) 23   SpO2 98%   Physical Exam  Procedures  Procedures  ED Course / MDM    Medical Decision Making Care assumed at 3 pm from Dr. Adela Lank, see his note for H & P.  Patient fell out of the wheelchair and fell face forward.  Patient is on Coumadin.  Signout pending labs and CT scan  8:35 PM Labs showed INR of 2.9.  CBC is stable.  CT head and cervical spine unremarkable.  Patient is stable for discharge back to [facility]  Problems Addressed: Contusion of scalp, initial encounter: acute illness or injury  Amount and/or Complexity of Data Reviewed Labs: ordered. Decision-making details documented in ED Course. Radiology: ordered. ECG/medicine tests: ordered.          Charlynne Pander, MD 08/11/23 [phone]

## 2023-08-11 NOTE — Discharge Instructions (Signed)
 As we discussed, your CT scan did not show any intracranial bleeding or fractures  See your doctor for follow-up  Fall precaution at the facility  Return to ER if you have another fall, severe pain or vomiting

## 2023-08-11 NOTE — ED Triage Notes (Signed)
 Patient from [facility] after falling from standing out of wheelchair. Fell face forward into a flower pot. On Warfarin.

## 2023-08-11 NOTE — Progress Notes (Signed)
 Orthopedic Tech Progress Note Patient Details:  [name] August 12, 1950 [id number]  Level 2 trauma  Patient ID: [name], female   DOB: 1950-06-16, 73 y.o.   MRN: [id number]  [name] 08/11/2023, 2:38 PM

## 2023-08-12 ENCOUNTER — Other Ambulatory Visit: Payer: Self-pay

## 2023-08-13 LAB — CUP PACEART REMOTE DEVICE CHECK
Battery Remaining Longevity: 108 mo
Battery Remaining Percentage: 100 %
Brady Statistic RA Percent Paced: 12 %
Brady Statistic RV Percent Paced: 10 %
Date Time Interrogation Session: 20250318023100
Implantable Lead Connection Status: 753985
Implantable Lead Connection Status: 753985
Implantable Lead Implant Date: 20210816
Implantable Lead Implant Date: 20210816
Implantable Lead Location: 753859
Implantable Lead Location: 753860
Implantable Lead Model: 7842
Implantable Lead Model: 7842
Implantable Lead Serial Number: 1013287
Implantable Lead Serial Number: 1091261
Implantable Pulse Generator Implant Date: 20210816
Lead Channel Impedance Value: 430 Ohm
Lead Channel Impedance Value: 707 Ohm
Lead Channel Setting Pacing Amplitude: 2.5 V
Lead Channel Setting Pacing Amplitude: 2.5 V
Lead Channel Setting Pacing Pulse Width: 0.4 ms
Lead Channel Setting Sensing Sensitivity: 2.5 mV
Pulse Gen Serial Number: 940432
Zone Setting Status: 755011

## 2023-08-17 ENCOUNTER — Encounter: Payer: Self-pay | Admitting: Internal Medicine

## 2023-08-17 DIAGNOSIS — N1832 Chronic kidney disease, stage 3b: Secondary | ICD-10-CM | POA: Diagnosis not present

## 2023-08-17 DIAGNOSIS — L309 Dermatitis, unspecified: Secondary | ICD-10-CM | POA: Diagnosis not present

## 2023-08-17 DIAGNOSIS — E039 Hypothyroidism, unspecified: Secondary | ICD-10-CM | POA: Diagnosis not present

## 2023-08-17 DIAGNOSIS — N390 Urinary tract infection, site not specified: Secondary | ICD-10-CM | POA: Diagnosis not present

## 2023-08-17 DIAGNOSIS — A0811 Acute gastroenteropathy due to Norwalk agent: Secondary | ICD-10-CM | POA: Diagnosis not present

## 2023-08-17 DIAGNOSIS — Z5181 Encounter for therapeutic drug level monitoring: Secondary | ICD-10-CM | POA: Diagnosis not present

## 2023-08-17 DIAGNOSIS — S0993XA Unspecified injury of face, initial encounter: Secondary | ICD-10-CM | POA: Diagnosis not present

## 2023-08-17 DIAGNOSIS — R296 Repeated falls: Secondary | ICD-10-CM | POA: Diagnosis not present

## 2023-08-17 DIAGNOSIS — B372 Candidiasis of skin and nail: Secondary | ICD-10-CM | POA: Diagnosis not present

## 2023-08-17 NOTE — ED Provider Notes (Signed)
 [name] Provider Note   CSN: [id number] Arrival date & time: 08/11/23  1426     History  Chief Complaint  Patient presents with   [name]    [name] is a 73 y.o. female.  73 yo F with a cc of a fall.  Patient trying to get out of a wheel chair reportedly.       Fall       Home Medications Prior to Admission medications   Medication Sig Start Date End Date Taking? Authorizing Provider  acetaminophen (TYLENOL) 500 MG tablet Take 1,000 mg by mouth in the morning, at noon, and at bedtime.   Yes [provider]  amiodarone (PACERONE) 200 MG tablet Take 200 mg by mouth daily.   Yes [provider]  BELSOMRA 20 MG TABS Take 1 tablet by mouth at bedtime. 08/02/23  Yes [provider]  carvedilol (COREG) 6.25 MG tablet Take 6.25 mg by mouth 2 (two) times daily.   Yes [provider]  dapagliflozin propanediol (FARXIGA) 10 MG TABS tablet Take 1 tablet (10 mg total) by mouth daily. 07/08/21  Yes Sheikh, Omair Latif, DO  DULoxetine (CYMBALTA) 60 MG capsule Take 60 mg by mouth daily. 08/03/23  Yes [provider]  estradiol (ESTRACE) 0.1 MG/GM vaginal cream Place 1 Applicatorful vaginally every Monday, Wednesday, and Friday.   Yes [provider]  fenofibrate 160 MG tablet Take 1 tablet (160 mg total) by mouth daily. 07/25/20  Yes [place], Eric J, DO  fexofenadine (ALLEGRA) 60 MG tablet Take 60 mg by mouth daily.   Yes [provider]  lamoTRIgine (LAMICTAL) 100 MG tablet Take 100 mg by mouth daily.   Yes [provider]  lamoTRIgine (LAMICTAL) 200 MG tablet Take 200 mg by mouth at bedtime. 12/13/20  Yes [provider]  levalbuterol Pauline Aus) 0.63 MG/3ML nebulizer solution Take 0.63 mg by nebulization daily.   Yes [provider]  levothyroxine (SYNTHROID) 50 MCG tablet Take 50 mcg by mouth daily before breakfast.   Yes [provider]   lidocaine 4 % Place 1 patch onto the skin daily. To left lower back   Yes [provider]  meclizine (ANTIVERT) 25 MG tablet Take 25 mg by mouth daily as needed for dizziness (vertigo).   Yes [provider]  methocarbamol (ROBAXIN) 500 MG tablet Take 500 mg by mouth 3 (three) times daily.   Yes [provider]  pantoprazole (PROTONIX) 20 MG tablet Take 20 mg by mouth every other day.   Yes [provider]  polyvinyl alcohol (LIQUIFILM TEARS) 1.4 % ophthalmic solution Place 1 drop into both eyes in the morning, at noon, and at bedtime.   Yes [provider]  potassium chloride SA (KLOR-CON M) 20 MEQ tablet Take 1 tablet (20 mEq total) by mouth daily. 07/08/21  Yes Sheikh, Omair Latif, DO  Propylene Glycol 0.6 % SOLN Apply 1 drop to eye at bedtime.   Yes [provider]  rosuvastatin (CRESTOR) 40 MG tablet Take 40 mg by mouth at bedtime.   Yes [provider]  sacubitril-valsartan (ENTRESTO) 24-26 MG Take 1 tablet by mouth 2 (two) times daily.   Yes [provider]  spironolactone (ALDACTONE) 25 MG tablet Take 0.5 tablets (12.5 mg total) by mouth daily. 01/13/23 02/20/24 Yes [place], Eric J, DO  traZODone (DESYREL) 100 MG tablet Take 100 mg by mouth daily. 07/27/23  Yes [provider]  traZODone (DESYREL)  50 MG tablet Take 50 mg by mouth at bedtime. 07/27/23  Yes [provider]  vitamin B-12 (CYANOCOBALAMIN) 1000 MCG tablet Take 1,000 mcg by mouth every Monday.   Yes [provider]  warfarin (COUMADIN) 2 MG tablet Take 2 mg by mouth See admin instructions. 2 mg once a day in the evenings on Tuesday, Thursday, Saturday. 10/01/20  Yes [provider]  furosemide (LASIX) 20 MG tablet Take 1 tablet (20 mg total) by mouth daily. Patient not taking: Reported on 08/11/2023 01/13/23   [place], Alvira Philips, DO  HYDROcodone-acetaminophen (NORCO/VICODIN) 5-325 MG tablet Take 1 tablet by mouth every 6 (six)  hours. Patient not taking: Reported on 08/11/2023 01/13/23   [place], Alvira Philips, DO  melatonin 5 MG TABS Take 10 mg by mouth at bedtime. Patient not taking: Reported on 08/11/2023    [provider]  methimazole (TAPAZOLE) 5 MG tablet Take 1 tablet (5 mg total) by mouth 3 (three) times daily. 06/24/11 08/31/19  Romero Belling, MD      Allergies    Mucinex [guaifenesin er], Keflex [cephalexin], Mucinex [guaifenesin er], Sulfa antibiotics, and Sulfonamide derivatives    Review of Systems   Review of Systems  Physical Exam Updated Vital Signs BP (!) 143/74   Pulse 83   Temp 97.8 F (36.6 C) (Oral)   Resp 20   SpO2 97%  Physical Exam Vitals and nursing note reviewed.  Constitutional:      General: She is not in acute distress.    Appearance: She is well-developed. She is not diaphoretic.  HENT:     Head: Normocephalic and atraumatic.  Eyes:     Pupils: Pupils are equal, round, and reactive to light.  Cardiovascular:     Rate and Rhythm: Normal rate and regular rhythm.     Heart sounds: No murmur heard.    No friction rub. No gallop.  Pulmonary:     Effort: Pulmonary effort is normal.     Breath sounds: No wheezing or rales.  Abdominal:     General: There is no distension.     Palpations: Abdomen is soft.     Tenderness: There is no abdominal tenderness.  Musculoskeletal:        General: No tenderness.     Cervical back: Normal range of motion and neck supple.     Comments: No obvious bony tenderness or diffuse palpation.   Skin:    General: Skin is warm and dry.  Neurological:     Mental Status: She is alert and oriented to person, place, and time.  Psychiatric:        Behavior: Behavior normal.     ED Results / Procedures / Treatments   Labs (all labs ordered are listed, but only abnormal results are displayed) Labs Reviewed  PROTIME-INR - Abnormal; Notable for the following components:      Result Value   Prothrombin Time 30.8 (*)    INR 2.9 (*)    All other  components within normal limits  CBC WITH DIFFERENTIAL/PLATELET - Abnormal; Notable for the following components:   RBC 3.70 (*)    Hemoglobin 11.5 (*)    MCV 100.8 (*)    All other components within normal limits  COMPREHENSIVE METABOLIC PANEL - Abnormal; Notable for the following components:   Glucose, Bld 111 (*)    Creatinine, Ser 1.36 (*)    Alkaline Phosphatase 25 (*)    GFR, Estimated 41 (*)    All other components within normal  limits    EKG EKG Interpretation Date/Time:  Wednesday August 12 2023 16:11:01 EDT Ventricular Rate:  118 PR Interval:  179 QRS Duration:  88 QT Interval:  289 QTC Calculation: 405 R Axis:   86  Text Interpretation: Atrial fibrillation Borderline right axis deviation Nonspecific repol abnormality, diffuse leads Confirmed by Tanda Rockers ([id number]) on 08/13/2023 7:12:55 PM  Radiology No results found.  Procedures Procedures    Medications Ordered in ED Medications - No data to display  ED Course/ Medical Decision Making/ A&P                                 Medical Decision Making Amount and/or Complexity of Data Reviewed Labs: ordered. Radiology: ordered. ECG/medicine tests: ordered.   73 yo F with a cc of a fall.  Patient arrived as level two trauma.  On coumadin.  Plan for CT head and C spine.  If negative likely home.   Signed out to Dr. Silverio Lay, please see their note for further details of care in the ED.  The patients results and plan were reviewed and discussed.   Any x-rays performed were independently reviewed by myself.   Differential diagnosis were considered with the presenting HPI.  Medications - No data to display  Vitals:   08/11/23 1431 08/11/23 1715 08/11/23 2014 08/11/23 2245  BP:  (!) 146/66  (!) 143/74  Pulse: 60 74  83  Resp: 16 (!) 23  20  Temp: 97.9 F (36.6 C)  97.8 F (36.6 C) 97.8 F (36.6 C)  TempSrc: Oral  Oral Oral  SpO2: 100% 98%  97%    Final diagnoses:  Contusion of scalp, initial encounter     Admission/ observation were discussed with the admitting physician, patient and/or family and they are comfortable with the plan.          Final Clinical Impression(s) / ED Diagnoses Final diagnoses:  Contusion of scalp, initial encounter    Rx / DC Orders ED Discharge Orders     None         Melene Plan, DO 08/17/23 1503

## 2023-08-18 DIAGNOSIS — N189 Chronic kidney disease, unspecified: Secondary | ICD-10-CM | POA: Diagnosis not present

## 2023-08-18 DIAGNOSIS — A0811 Acute gastroenteropathy due to Norwalk agent: Secondary | ICD-10-CM | POA: Diagnosis not present

## 2023-08-18 DIAGNOSIS — E1122 Type 2 diabetes mellitus with diabetic chronic kidney disease: Secondary | ICD-10-CM | POA: Diagnosis not present

## 2023-08-18 DIAGNOSIS — Z7984 Long term (current) use of oral hypoglycemic drugs: Secondary | ICD-10-CM | POA: Diagnosis not present

## 2023-08-18 DIAGNOSIS — R197 Diarrhea, unspecified: Secondary | ICD-10-CM | POA: Diagnosis not present

## 2023-08-18 DIAGNOSIS — R110 Nausea: Secondary | ICD-10-CM | POA: Diagnosis not present

## 2023-08-20 ENCOUNTER — Encounter (HOSPITAL_COMMUNITY): Payer: PPO

## 2023-08-24 ENCOUNTER — Other Ambulatory Visit: Payer: Self-pay

## 2023-08-24 ENCOUNTER — Encounter (HOSPITAL_COMMUNITY): Payer: Self-pay | Admitting: Emergency Medicine

## 2023-08-24 ENCOUNTER — Emergency Department (HOSPITAL_COMMUNITY)
Admission: EM | Admit: 2023-08-24 | Discharge: 2023-08-24 | Disposition: A | Discharge status: Home / Self Care | Attending: Emergency Medicine | Admitting: Emergency Medicine

## 2023-08-24 DIAGNOSIS — R7889 Finding of other specified substances, not normally found in blood: Secondary | ICD-10-CM | POA: Diagnosis not present

## 2023-08-24 DIAGNOSIS — Z7901 Long term (current) use of anticoagulants: Secondary | ICD-10-CM | POA: Insufficient documentation

## 2023-08-24 DIAGNOSIS — I10 Essential (primary) hypertension: Secondary | ICD-10-CM | POA: Diagnosis not present

## 2023-08-24 DIAGNOSIS — R197 Diarrhea, unspecified: Secondary | ICD-10-CM | POA: Diagnosis not present

## 2023-08-24 DIAGNOSIS — R791 Abnormal coagulation profile: Secondary | ICD-10-CM | POA: Diagnosis not present

## 2023-08-24 DIAGNOSIS — R1111 Vomiting without nausea: Secondary | ICD-10-CM | POA: Diagnosis not present

## 2023-08-24 LAB — CBC
HCT: 36.4 % (ref 36.0–46.0)
Hemoglobin: 11.6 g/dL — ABNORMAL LOW (ref 12.0–15.0)
MCH: 31.1 pg (ref 26.0–34.0)
MCHC: 31.9 g/dL (ref 30.0–36.0)
MCV: 97.6 fL (ref 80.0–100.0)
Platelets: 257 10*3/uL (ref 150–400)
RBC: 3.73 MIL/uL — ABNORMAL LOW (ref 3.87–5.11)
RDW: 14.5 % (ref 11.5–15.5)
WBC: 5 10*3/uL (ref 4.0–10.5)
nRBC: 0 % (ref 0.0–0.2)

## 2023-08-24 LAB — BASIC METABOLIC PANEL WITH GFR
Anion gap: 8 (ref 5–15)
BUN: 15 mg/dL (ref 8–23)
CO2: 26 mmol/L (ref 22–32)
Calcium: 9.5 mg/dL (ref 8.9–10.3)
Chloride: 105 mmol/L (ref 98–111)
Creatinine, Ser: 1.24 mg/dL — ABNORMAL HIGH (ref 0.44–1.00)
GFR, Estimated: 46 mL/min — ABNORMAL LOW (ref >60)
Glucose, Bld: 103 mg/dL — ABNORMAL HIGH (ref 70–99)
Potassium: 5.3 mmol/L — ABNORMAL HIGH (ref 3.5–5.1)
Sodium: 139 mmol/L (ref 135–145)

## 2023-08-24 LAB — PROTIME-INR
INR: 7.1 (ref 0.8–1.2)
Prothrombin Time: 61.3 s — ABNORMAL HIGH (ref 11.4–15.2)

## 2023-08-24 NOTE — Discharge Instructions (Signed)
 1.  Do not take any more Coumadin. 2.  Be very careful to avoid any falls or injuries. 3.  Return to the Emergency Department immediately if you have a sudden headache, bleeding from any source or sudden lightheadedness or unexplained pain. 4.  Your INR should be checked daily.  Do not resume Coumadin until INR is down to your therapeutic level again.

## 2023-08-24 NOTE — ED Provider Notes (Signed)
 [place] EMERGENCY DEPARTMENT AT [facility] Provider Note   CSN: [id number] Arrival date & time: 08/24/23  1156     History  No chief complaint on file.   [name] is a 73 y.o. female.  HPI Sent for elevated INR.  At rehab it read greater than 8.  Patient denies any bleeding.  She has not had any nosebleed, no oral bleeding, no dark appearing stool, no unexplained pain.  She had a fall a number of days ago and has some bruising around the left eye but no pain associated.  No headache, no confusion.    Home Medications Prior to Admission medications   Medication Sig Start Date End Date Taking? Authorizing Provider  acetaminophen (TYLENOL) 500 MG tablet Take 1,000 mg by mouth in the morning, at noon, and at bedtime.   Yes [provider]  amiodarone (PACERONE) 200 MG tablet Take 200 mg by mouth daily.   Yes [provider]  BELSOMRA 20 MG TABS Take 1 tablet by mouth at bedtime. 08/02/23  Yes [provider]  carvedilol (COREG) 6.25 MG tablet Take 6.25 mg by mouth 2 (two) times daily.   Yes [provider]  dapagliflozin propanediol (FARXIGA) 10 MG TABS tablet Take 1 tablet (10 mg total) by mouth daily. 07/08/21  Yes Sheikh, Omair Latif, DO  DULoxetine (CYMBALTA) 60 MG capsule Take 60 mg by mouth daily. 08/03/23  Yes [provider]  estradiol (ESTRACE) 0.1 MG/GM vaginal cream Place 1 Applicatorful vaginally every Monday, Wednesday, and Friday.   Yes [provider]  fenofibrate 160 MG tablet Take 1 tablet (160 mg total) by mouth daily. 07/25/20  Yes [place], Eric J, DO  fexofenadine (ALLEGRA) 60 MG tablet Take 60 mg by mouth daily.   Yes [provider]  hydrocortisone cream 1 % Apply 1 Application topically 2 (two) times daily. Apply to abdominal area and to top of breasts for itching// not in skin folds   Yes [provider]  lamoTRIgine (LAMICTAL) 100 MG tablet Take 100 mg by mouth daily.   Yes  [provider]  lamoTRIgine (LAMICTAL) 200 MG tablet Take 200 mg by mouth at bedtime. 12/13/20  Yes [provider]  levalbuterol Pauline Aus) 0.63 MG/3ML nebulizer solution Take 0.63 mg by nebulization daily as needed for wheezing or shortness of breath.   Yes [provider]  levothyroxine (SYNTHROID) 50 MCG tablet Take 50 mcg by mouth daily before breakfast.   Yes [provider]  lidocaine 4 % Place 1 patch onto the skin daily. To left lower back   Yes [provider]  meclizine (ANTIVERT) 25 MG tablet Take 25 mg by mouth daily as needed for dizziness (vertigo).   Yes [provider]  methocarbamol (ROBAXIN) 500 MG tablet Take 500 mg by mouth 3 (three) times daily.   Yes [provider]  miconazole (MICOTIN) 2 % powder Apply 1 Application topically in the morning and at bedtime. Liberal dusting, topical, twice a day, apply to folds of groin, abdominal skin folds and under the breasts yeast   Yes [provider]  ondansetron (ZOFRAN-ODT) 4 MG disintegrating tablet Take 4 mg by mouth every 8 (eight) hours as needed for vomiting, nausea or refractory nausea / vomiting. 08/18/23  Yes [provider]  pantoprazole (PROTONIX) 20 MG tablet Take 20 mg by mouth every other day.   Yes [provider]  polyvinyl alcohol (LIQUIFILM TEARS) 1.4 % ophthalmic solution Place 1 drop into  both eyes in the morning, at noon, and at bedtime.   Yes [provider]  potassium chloride SA (KLOR-CON M) 20 MEQ tablet Take 1 tablet (20 mEq total) by mouth daily. 07/08/21  Yes Sheikh, Omair Latif, DO  rosuvastatin (CRESTOR) 40 MG tablet Take 40 mg by mouth at bedtime.   Yes [provider]  sacubitril-valsartan (ENTRESTO) 24-26 MG Take 1 tablet by mouth 2 (two) times daily.   Yes [provider]  spironolactone (ALDACTONE) 25 MG tablet Take 0.5 tablets (12.5 mg total) by mouth daily. 01/13/23 02/20/24 Yes [place], Eric  J, DO  traZODone (DESYREL) 100 MG tablet Take 100 mg by mouth daily. TDD: 150 mg 07/27/23  Yes [provider]  traZODone (DESYREL) 50 MG tablet Take 50 mg by mouth at bedtime. TDD: 150 mg 07/27/23  Yes [provider]  vitamin B-12 (CYANOCOBALAMIN) 1000 MCG tablet Take 1,000 mcg by mouth every Monday.   Yes [provider]  warfarin (COUMADIN) 2 MG tablet Take 2 mg by mouth daily at 12 noon. 10/01/20  Yes [provider]  furosemide (LASIX) 20 MG tablet Take 1 tablet (20 mg total) by mouth daily. Patient not taking: Reported on 08/11/2023 01/13/23   [place], Alvira Philips, DO  Propylene Glycol 0.6 % SOLN Apply 1 drop to eye at bedtime.    [provider]  methimazole (TAPAZOLE) 5 MG tablet Take 1 tablet (5 mg total) by mouth 3 (three) times daily. 06/24/11 08/31/19  Romero Belling, MD      Allergies    Mucinex [guaifenesin er], Keflex [cephalexin], Mucinex [guaifenesin er], Sulfa antibiotics, and Sulfonamide derivatives    Review of Systems   Review of Systems  Physical Exam Updated Vital Signs BP (!) 142/73   Pulse 73   Temp 98.2 F (36.8 C) (Oral)   Resp 17   Ht 5\' 8"  (1.727 m)   Wt 104.3 kg   SpO2 99%   BMI 34.97 kg/m  Physical Exam Constitutional:      Comments: Alert with clear mental status.  No respiratory distress  HENT:     Head:     Comments: Patient has some older dark purpleish ecchymosis underneath the left eye and at the brow.  She does not have any associated periorbital soft tissue swelling.    Nose: Nose normal.     Mouth/Throat:     Mouth: Mucous membranes are moist.     Pharynx: Oropharynx is clear.  Eyes:     Extraocular Movements: Extraocular movements intact.     Conjunctiva/sclera: Conjunctivae normal.     Pupils: Pupils are equal, round, and reactive to light.  Cardiovascular:     Rate and Rhythm: Normal rate.  Pulmonary:     Effort: Pulmonary effort is normal.     Breath sounds: Normal breath sounds.  Abdominal:      Palpations: Abdomen is soft.  Musculoskeletal:        General: No deformity or signs of injury. Normal range of motion.     Cervical back: Neck supple.  Skin:    General: Skin is warm and dry.  Neurological:     General: No focal deficit present.     Mental Status: She is oriented to person, place, and time.     Motor: No weakness.     Coordination: Coordination normal.  Psychiatric:        Mood and Affect: Mood normal.     ED Results / Procedures / Treatments   Labs (  all labs ordered are listed, but only abnormal results are displayed) Labs Reviewed  BASIC METABOLIC PANEL WITH GFR - Abnormal; Notable for the following components:      Result Value   Potassium 5.3 (*)    Glucose, Bld 103 (*)    Creatinine, Ser 1.24 (*)    GFR, Estimated 46 (*)    All other components within normal limits  PROTIME-INR - Abnormal; Notable for the following components:   Prothrombin Time 61.3 (*)    INR 7.1 (*)    All other components within normal limits  CBC - Abnormal; Notable for the following components:   RBC 3.73 (*)    Hemoglobin 11.6 (*)    All other components within normal limits    EKG EKG Interpretation Date/Time:  Monday August 24 2023 12:03:56 EDT Ventricular Rate:  80 PR Interval:    QRS Duration:  114 QT Interval:  401 QTC Calculation: 463 R Axis:   28  Text Interpretation: Atrial fibrillation Incomplete left bundle branch block Low voltage, precordial leads Anterior Q waves, possibly due to ILBBB lower voltage and slower rate than previous. no acute ischemic appearance Confirmed by Arby Barrette [phone]) on 08/24/2023 12:28:26 PM  Radiology No results found.  Procedures Procedures    Medications Ordered in ED Medications - No data to display  ED Course/ Medical Decision Making/ A&P                                 Medical Decision Making Amount and/or Complexity of Data Reviewed Labs: ordered.   Patient's INR in the emergency department is 7.1.   Hemoglobin is 11.6.  Platelets 257.  Patient has history of aortic valve replacement.  She does not have any signs of active bleeding with elevated INR.  She has a much older appearing contusion at the left eye but there is no soft tissue swelling.  No headache no confusion no signs of any intracranial injury associated with this.  She was sent specifically for elevated INR.  At this time, with no associated bleeding and patient otherwise stable, recommendation is to hold the Coumadin and carefully monitor INR.  At this time with no active bleeding no indication for reversal.  I have carefully reviewed this with the patient and need for immediate return and she voices understanding.        Final Clinical Impression(s) / ED Diagnoses Final diagnoses:  Elevated INR    Rx / DC Orders ED Discharge Orders     None         Arby Barrette, MD 08/24/23 1601

## 2023-08-24 NOTE — ED Notes (Signed)
 Ptar called pt to [facility] ETA within the hour

## 2023-08-24 NOTE — ED Triage Notes (Signed)
 Pt BIB [facility] from [facility] due to INR being greather than 8.  Pt was diagnosed with Norovirus past Thursday 08/20/2023. VS BP 140/68, HR 76, Resp 18, CBG 118

## 2023-08-25 DIAGNOSIS — Z5181 Encounter for therapeutic drug level monitoring: Secondary | ICD-10-CM | POA: Diagnosis not present

## 2023-08-25 DIAGNOSIS — R791 Abnormal coagulation profile: Secondary | ICD-10-CM | POA: Diagnosis not present

## 2023-08-25 DIAGNOSIS — Z7901 Long term (current) use of anticoagulants: Secondary | ICD-10-CM | POA: Diagnosis not present

## 2023-08-26 DIAGNOSIS — Z7189 Other specified counseling: Secondary | ICD-10-CM | POA: Diagnosis not present

## 2023-08-26 DIAGNOSIS — A084 Viral intestinal infection, unspecified: Secondary | ICD-10-CM | POA: Diagnosis not present

## 2023-08-26 DIAGNOSIS — I5042 Chronic combined systolic (congestive) and diastolic (congestive) heart failure: Secondary | ICD-10-CM | POA: Diagnosis not present

## 2023-08-26 DIAGNOSIS — R197 Diarrhea, unspecified: Secondary | ICD-10-CM | POA: Diagnosis not present

## 2023-08-26 DIAGNOSIS — Z952 Presence of prosthetic heart valve: Secondary | ICD-10-CM | POA: Diagnosis not present

## 2023-08-26 DIAGNOSIS — I482 Chronic atrial fibrillation, unspecified: Secondary | ICD-10-CM | POA: Diagnosis not present

## 2023-08-26 DIAGNOSIS — Z5181 Encounter for therapeutic drug level monitoring: Secondary | ICD-10-CM | POA: Diagnosis not present

## 2023-08-31 DIAGNOSIS — Z5181 Encounter for therapeutic drug level monitoring: Secondary | ICD-10-CM | POA: Diagnosis not present

## 2023-08-31 DIAGNOSIS — Z7901 Long term (current) use of anticoagulants: Secondary | ICD-10-CM | POA: Diagnosis not present

## 2023-09-11 DIAGNOSIS — I10 Essential (primary) hypertension: Secondary | ICD-10-CM | POA: Diagnosis not present

## 2023-09-21 DIAGNOSIS — Z7901 Long term (current) use of anticoagulants: Secondary | ICD-10-CM | POA: Diagnosis not present

## 2023-09-21 DIAGNOSIS — G4700 Insomnia, unspecified: Secondary | ICD-10-CM | POA: Diagnosis not present

## 2023-09-21 DIAGNOSIS — F5104 Psychophysiologic insomnia: Secondary | ICD-10-CM | POA: Diagnosis not present

## 2023-09-21 DIAGNOSIS — I4891 Unspecified atrial fibrillation: Secondary | ICD-10-CM | POA: Diagnosis not present

## 2023-09-21 DIAGNOSIS — F3132 Bipolar disorder, current episode depressed, moderate: Secondary | ICD-10-CM | POA: Diagnosis not present

## 2023-09-21 DIAGNOSIS — R059 Cough, unspecified: Secondary | ICD-10-CM | POA: Diagnosis not present

## 2023-09-26 DIAGNOSIS — Z7901 Long term (current) use of anticoagulants: Secondary | ICD-10-CM | POA: Diagnosis not present

## 2023-09-27 DIAGNOSIS — Z7901 Long term (current) use of anticoagulants: Secondary | ICD-10-CM | POA: Diagnosis not present

## 2023-09-28 DIAGNOSIS — Z7901 Long term (current) use of anticoagulants: Secondary | ICD-10-CM | POA: Diagnosis not present

## 2023-09-29 NOTE — Progress Notes (Signed)
 Remote pacemaker transmission.

## 2023-09-29 NOTE — Addendum Note (Signed)
 Addended by: Lott Rouleau A on: 09/29/2023 11:29 AM   Modules accepted: Orders

## 2023-10-05 DIAGNOSIS — R5383 Other fatigue: Secondary | ICD-10-CM | POA: Diagnosis not present

## 2023-10-05 DIAGNOSIS — E039 Hypothyroidism, unspecified: Secondary | ICD-10-CM | POA: Diagnosis not present

## 2023-10-05 DIAGNOSIS — I4891 Unspecified atrial fibrillation: Secondary | ICD-10-CM | POA: Diagnosis not present

## 2023-10-05 DIAGNOSIS — I5042 Chronic combined systolic (congestive) and diastolic (congestive) heart failure: Secondary | ICD-10-CM | POA: Diagnosis not present

## 2023-10-05 DIAGNOSIS — E1122 Type 2 diabetes mellitus with diabetic chronic kidney disease: Secondary | ICD-10-CM | POA: Diagnosis not present

## 2023-10-05 DIAGNOSIS — N1832 Chronic kidney disease, stage 3b: Secondary | ICD-10-CM | POA: Diagnosis not present

## 2023-10-05 DIAGNOSIS — F319 Bipolar disorder, unspecified: Secondary | ICD-10-CM | POA: Diagnosis not present

## 2023-10-05 DIAGNOSIS — E785 Hyperlipidemia, unspecified: Secondary | ICD-10-CM | POA: Diagnosis not present

## 2023-10-05 DIAGNOSIS — M5442 Lumbago with sciatica, left side: Secondary | ICD-10-CM | POA: Diagnosis not present

## 2023-10-05 DIAGNOSIS — Z7901 Long term (current) use of anticoagulants: Secondary | ICD-10-CM | POA: Diagnosis not present

## 2023-10-15 DIAGNOSIS — Z5181 Encounter for therapeutic drug level monitoring: Secondary | ICD-10-CM | POA: Diagnosis not present

## 2023-10-15 DIAGNOSIS — M545 Low back pain, unspecified: Secondary | ICD-10-CM | POA: Diagnosis not present

## 2023-10-15 DIAGNOSIS — I4891 Unspecified atrial fibrillation: Secondary | ICD-10-CM | POA: Diagnosis not present

## 2023-10-15 DIAGNOSIS — R5383 Other fatigue: Secondary | ICD-10-CM | POA: Diagnosis not present

## 2023-10-15 DIAGNOSIS — Z7901 Long term (current) use of anticoagulants: Secondary | ICD-10-CM | POA: Diagnosis not present

## 2023-10-15 DIAGNOSIS — D631 Anemia in chronic kidney disease: Secondary | ICD-10-CM | POA: Diagnosis not present

## 2023-10-15 DIAGNOSIS — R5381 Other malaise: Secondary | ICD-10-CM | POA: Diagnosis not present

## 2023-10-15 DIAGNOSIS — I5042 Chronic combined systolic (congestive) and diastolic (congestive) heart failure: Secondary | ICD-10-CM | POA: Diagnosis not present

## 2023-10-15 DIAGNOSIS — E039 Hypothyroidism, unspecified: Secondary | ICD-10-CM | POA: Diagnosis not present

## 2023-10-15 DIAGNOSIS — N1832 Chronic kidney disease, stage 3b: Secondary | ICD-10-CM | POA: Diagnosis not present

## 2023-10-15 DIAGNOSIS — Z7984 Long term (current) use of oral hypoglycemic drugs: Secondary | ICD-10-CM | POA: Diagnosis not present

## 2023-10-15 DIAGNOSIS — E1122 Type 2 diabetes mellitus with diabetic chronic kidney disease: Secondary | ICD-10-CM | POA: Diagnosis not present

## 2023-10-16 DIAGNOSIS — I10 Essential (primary) hypertension: Secondary | ICD-10-CM | POA: Diagnosis not present

## 2023-10-16 DIAGNOSIS — E559 Vitamin D deficiency, unspecified: Secondary | ICD-10-CM | POA: Diagnosis not present

## 2023-10-16 DIAGNOSIS — Z139 Encounter for screening, unspecified: Secondary | ICD-10-CM | POA: Diagnosis not present

## 2023-10-22 ENCOUNTER — Encounter: Payer: Self-pay | Admitting: Internal Medicine

## 2023-10-22 ENCOUNTER — Ambulatory Visit: Attending: Internal Medicine | Admitting: Internal Medicine

## 2023-10-22 VITALS — BP 110/58 | HR 67 | Ht 68.0 in | Wt 239.0 lb

## 2023-10-22 DIAGNOSIS — I4819 Other persistent atrial fibrillation: Secondary | ICD-10-CM

## 2023-10-22 DIAGNOSIS — R5381 Other malaise: Secondary | ICD-10-CM | POA: Diagnosis not present

## 2023-10-22 DIAGNOSIS — Z7901 Long term (current) use of anticoagulants: Secondary | ICD-10-CM | POA: Diagnosis not present

## 2023-10-22 DIAGNOSIS — I150 Renovascular hypertension: Secondary | ICD-10-CM | POA: Diagnosis not present

## 2023-10-22 DIAGNOSIS — M5441 Lumbago with sciatica, right side: Secondary | ICD-10-CM | POA: Diagnosis not present

## 2023-10-22 DIAGNOSIS — I5042 Chronic combined systolic (congestive) and diastolic (congestive) heart failure: Secondary | ICD-10-CM | POA: Diagnosis not present

## 2023-10-22 DIAGNOSIS — E039 Hypothyroidism, unspecified: Secondary | ICD-10-CM | POA: Diagnosis not present

## 2023-10-22 DIAGNOSIS — G4700 Insomnia, unspecified: Secondary | ICD-10-CM | POA: Diagnosis not present

## 2023-10-22 DIAGNOSIS — N1832 Chronic kidney disease, stage 3b: Secondary | ICD-10-CM | POA: Diagnosis not present

## 2023-10-22 DIAGNOSIS — R5383 Other fatigue: Secondary | ICD-10-CM | POA: Diagnosis not present

## 2023-10-22 DIAGNOSIS — D631 Anemia in chronic kidney disease: Secondary | ICD-10-CM | POA: Diagnosis not present

## 2023-10-22 NOTE — Patient Instructions (Addendum)
 Medication Instructions:  Your physician has recommended you make the following change in your medication:  Stop amiodarone .  Lab Work: None ordered.  You may go to any [facility] Location for your lab work:  [facility] - [address] ([facility]) - [phone] [address]  [place] - [address]   [place]  - [address]   [place] - [address] - [address] [employer]  - [address] [place] - [address] ([facility]   If you have labs (blood work) drawn today and your tests are completely normal, you will receive your results only by: [employer] (if you have MyChart)  If you have any lab test that is abnormal or we need to change your treatment, we will call you or send a MyChart message to review the results.  Testing/Procedures: None ordered.  Follow-Up: At [facility], you and your health needs are our priority.  As part of our continuing mission to provide you with exceptional heart care, we have created designated Provider Care Teams.  These Care Teams include your primary Cardiologist (physician) and Advanced Practice Providers (APPs -  Physician Assistants and Nurse Practitioners) who all work together to provide you with the care you need, when you need it.  Your next appointment:   1 year(s)  The format for your next appointment:   In Person  Provider:   Manya Sells, MD{or one of the following Advanced Practice Providers on your designated Care Team:   Mertha Abrahams, [place] Bambi Lever "Jonelle Neri" [place], [place] Neda Balk, NP  Note: Remote monitoring is used to monitor your Pacemaker/ ICD from home. This monitoring reduces the number of office visits required to check your device to one time per year. It allows us  to keep an eye on the functioning of your device to ensure it is working properly.

## 2023-10-22 NOTE — Progress Notes (Signed)
 HPI [name] returns today for ongoing evaluation of persistent atrial fib. She is a pleasant 73 yo woman with bipolar disorder, AI, s/p AVR, sinus node dysfunction and prolongation of the QT due to taking multiple meds for her bipolar disorder. She notes that her bipolar is well controlled on her QT prolonging meds. With symptomatic tachy-brady, she underwent PPM insertion almost 4 years ago. She is still weak but denies palpitations. She has unfortunately had multiple falls/subdural hematoma's but has recovered remarkably well. She has not had syncope. There was a question of whether the amiodarone  that she had been taking was contributing to her dizziness and propensity and it was stopped. Allergies  Allergen Reactions   Mucinex [Guaifenesin Er] Other (See Comments)    Severe headaches    Keflex  [Cephalexin ] Other (See Comments)    Headaches Dizziness    Mucinex [Guaifenesin Er] Other (See Comments)    Severe headaches   Sulfa Antibiotics Other (See Comments)    Headaches    Sulfonamide Derivatives Other (See Comments)    Headaches      Current Outpatient Medications  Medication Sig Dispense Refill   acetaminophen  (TYLENOL ) 500 MG tablet Take 1,000 mg by mouth in the morning, at noon, and at bedtime.     amiodarone  (PACERONE ) 200 MG tablet Take 200 mg by mouth daily.     BELSOMRA 20 MG TABS Take 1 tablet by mouth at bedtime.     benzonatate  (TESSALON ) 100 MG capsule Take 100 mg by mouth 3 (three) times daily.     carvedilol  (COREG ) 6.25 MG tablet Take 6.25 mg by mouth 2 (two) times daily.     dapagliflozin  propanediol (FARXIGA ) 10 MG TABS tablet Take 1 tablet (10 mg total) by mouth daily. 30 tablet 0   DULoxetine (CYMBALTA) 60 MG capsule Take 60 mg by mouth daily.     estradiol  (ESTRACE ) 0.1 MG/GM vaginal cream Place 1 Applicatorful vaginally every Monday, Wednesday, and Friday.     fenofibrate  160 MG tablet Take 1 tablet (160 mg total) by mouth daily.     fexofenadine  (ALLEGRA) 60 MG tablet Take 60 mg by mouth daily.     furosemide  (LASIX ) 20 MG tablet Take 1 tablet (20 mg total) by mouth daily. 30 tablet 0   hydrocortisone  cream 1 % Apply 1 Application topically 2 (two) times daily. Apply to abdominal area and to top of breasts for itching// not in skin folds     lamoTRIgine  (LAMICTAL ) 100 MG tablet Take 100 mg by mouth daily.     lamoTRIgine  (LAMICTAL ) 200 MG tablet Take 200 mg by mouth at bedtime.     levalbuterol (XOPENEX) 0.63 MG/3ML nebulizer solution Take 0.63 mg by nebulization daily as needed for wheezing or shortness of breath.     levothyroxine  (SYNTHROID ) 50 MCG tablet Take 50 mcg by mouth daily before breakfast.     lidocaine  4 % Place 1 patch onto the skin daily. To left lower back     meclizine (ANTIVERT) 25 MG tablet Take 25 mg by mouth daily as needed for dizziness (vertigo).     methocarbamol (ROBAXIN) 500 MG tablet Take 500 mg by mouth 3 (three) times daily.     miconazole (MICOTIN) 2 % powder Apply 1 Application topically in the morning and at bedtime. Liberal dusting, topical, twice a day, apply to folds of groin, abdominal skin folds and under the breasts yeast     pantoprazole  (PROTONIX ) 20 MG tablet Take 20 mg  by mouth every other day.     polyvinyl alcohol  (LIQUIFILM TEARS) 1.4 % ophthalmic solution Place 1 drop into both eyes in the morning, at noon, and at bedtime.     potassium chloride  SA (KLOR-CON  M) 20 MEQ tablet Take 1 tablet (20 mEq total) by mouth daily.     Propylene Glycol 0.6 % SOLN Apply 1 drop to eye at bedtime.     rosuvastatin  (CRESTOR ) 40 MG tablet Take 40 mg by mouth at bedtime.     sacubitril -valsartan  (ENTRESTO ) 24-26 MG Take 1 tablet by mouth 2 (two) times daily.     spironolactone  (ALDACTONE ) 25 MG tablet Take 0.5 tablets (12.5 mg total) by mouth daily. 30 tablet 0   traZODone  (DESYREL ) 100 MG tablet Take 100 mg by mouth daily. TDD: 150 mg     traZODone  (DESYREL ) 50 MG tablet Take 50 mg by mouth at bedtime. TDD:  150 mg     vitamin B-12 (CYANOCOBALAMIN ) 1000 MCG tablet Take 1,000 mcg by mouth every Monday.     warfarin (COUMADIN ) 2 MG tablet Take 2 mg by mouth daily at 12 noon.     ondansetron  (ZOFRAN -ODT) 4 MG disintegrating tablet Take 4 mg by mouth every 8 (eight) hours as needed for vomiting, nausea or refractory nausea / vomiting. (Patient not taking: Reported on 10/22/2023)     No current facility-administered medications for this visit.     Past Medical History:  Diagnosis Date   Acute cystitis without hematuria 07/24/2020   Acute hypoxemic respiratory failure (HCC) 06/27/2021   Acute on chronic combined systolic and diastolic CHF (congestive heart failure) (HCC) 07/24/2020   Altered mental status 04/29/2020   ANEMIA 05/28/2010   Ankle pain 02/04/2018   AORTIC VALVE REPLACEMENT, HX OF 03/11/2010   Mechanical prosthesis   Ascending aortic aneurysm (HCC)    Atrial fibrillation (HCC)    Atrial flutter (HCC)    BIPOLAR AFFECTIVE DISORDER 03/11/2010   Bradycardia    Cellulitis and abscess of left leg 11/18/2010   Chronic diastolic CHF (congestive heart failure) (HCC)    Chronic kidney disease    Constipation 03/11/2010   Qualifier: Diagnosis of   By: Rodrick Clapper MD, Stacey         Depression    Diabetes [facility])    Diverticulitis 12/18/2010   Elevated TSH 04/30/2020   Fall at home, initial encounter 07/24/2020   Falls    FIBROIDS, UTERUS 03/11/2010   Gout 09/04/2016   Grave's disease 12/2010   Hyperlipidemia associated with type 2 diabetes mellitus (HCC) 06/05/2016   Hypertension    Hyperthyroidism 12/18/2010   In the context of amiodarone , resolved off it     Low back pain 11/02/2017   Migraine 06/05/2016   Mixed hyperlipidemia 03/11/2010   Near syncope 08/21/2019   Obesity    Pacemaker    Prolonged QT interval    SAH (subarachnoid hemorrhage) (HCC)    Skin cancer    Subarachnoid hematoma (HCC) 07/24/2020   Subdural hematoma (HCC)    Supratherapeutic INR 04/30/2020   Syncope  08/22/2019   UTI (lower urinary tract infection) 08/14/2011   UTI (urinary tract infection) 02/10/2013    ROS:   All systems reviewed and negative except as noted in the HPI.   Past Surgical History:  Procedure Laterality Date   ABDOMINAL HYSTERECTOMY  ?1998   sb/l spo, total for fibroids/heavy bleeding   ANKLE SURGERY Left 01/2018   hardware removal   AORTIC VALVE REPLACEMENT  2011   Mechanical prosthesis,  [employer]   APPENDECTOMY     Required revision for EColi infection, required recurrent packing   bowel obstruction     Requiring adhesions to be removed   CARDIOVERSION N/A 05/09/2020   Procedure: CARDIOVERSION;  Surgeon: Maudine Sos, MD;  Location: [facility] ENDOSCOPY;  Service: Cardiovascular;  Laterality: N/A;   CARDIOVERSION N/A 09/23/2021   Procedure: CARDIOVERSION;  Surgeon: Darlis Eisenmenger, MD;  Location: [facility] ENDOSCOPY;  Service: Cardiovascular;  Laterality: N/A;   CHOLECYSTECTOMY     COLONOSCOPY WITH PROPOFOL  N/A 11/01/2015   Procedure: COLONOSCOPY WITH PROPOFOL ;  Surgeon: Janel Medford, MD;  Location: [facility] ENDOSCOPY;  Service: Endoscopy;  Laterality: N/A;   CYSTOCELE REPAIR N/A 10/16/2015   Procedure: ANTERIOR REPAIR (CYSTOCELE);  Surgeon: Granville Layer, MD;  Location: [facility] ORS;  Service: Gynecology;  Laterality: N/A;   HERNIA REPAIR  08-16-10   LEFT HEART CATHETERIZATION WITH CORONARY ANGIOGRAM N/A 12/05/2011   Procedure: LEFT HEART CATHETERIZATION WITH CORONARY ANGIOGRAM;  Surgeon: Mickiel Albany, MD;  Location: [facility] CATH LAB;  Service: Cardiovascular;  Laterality: N/A;   PACEMAKER IMPLANT N/A 01/09/2020   Procedure: PACEMAKER IMPLANT;  Surgeon: Tammie Fall, MD;  Location: [facility] INVASIVE CV LAB;  Service: Cardiovascular;  Laterality: N/A;   RIGHT HEART CATH AND CORONARY ANGIOGRAPHY N/A 07/05/2021   Procedure: RIGHT HEART CATH AND CORONARY ANGIOGRAPHY;  Surgeon: Arnoldo Lapping, MD;  Location: [facility] INVASIVE CV LAB;  Service: Cardiovascular;  Laterality: N/A;   TEE WITHOUT  CARDIOVERSION N/A 09/23/2021   Procedure: TRANSESOPHAGEAL ECHOCARDIOGRAM (TEE);  Surgeon: Darlis Eisenmenger, MD;  Location: [facility] ENDOSCOPY;  Service: Cardiovascular;  Laterality: N/A;   TOTAL KNEE ARTHROPLASTY Right 02/24/2013   Procedure: RIGHT TOTAL KNEE ARTHROPLASTY;  Surgeon: Loel Ring, MD;  Location: [facility] ORS;  Service: Orthopedics;  Laterality: Right;   TUBAL LIGATION     varicose vein surgery b/l legs       Family History  Problem Relation Age of Onset   Scoliosis Mother    Arthritis Mother        Rheumatoid   Aneurysm Mother        heart   Alcohol  abuse Father    Depression Sister    Depression Brother    Alcohol  abuse Brother    Cancer Maternal Grandmother        colon   Diabetes Maternal Grandfather    Heart disease Maternal Grandfather    Alcohol  abuse Maternal Grandfather    Depression Paternal Grandmother    Diabetes Paternal Grandmother    Depression Paternal Grandfather    Diabetes Paternal Grandfather    Depression Sister    Alcohol  abuse Brother    Depression Brother    Heart disease Brother      Social History   Socioeconomic History   Marital status: Widowed    Spouse name: Not on file   Number of children: Not on file   Years of education: Not on file   Highest education level: Not on file  Occupational History   Not on file  Tobacco Use   Smoking status: Former    Current packs/day: 0.00    Types: Cigarettes    Quit date: 05/26/1990    Years since quitting: 33.4   Smokeless tobacco: Never  Vaping Use   Vaping status: Never Used  Substance and Sexual Activity   Alcohol  use: Yes    Alcohol /week: 0.0 standard drinks of alcohol     Comment: rare   Drug use: No   Sexual activity:  Not on file  Other Topics Concern   Not on file  Social History Narrative   ** Merged History Encounter **       Social Drivers of Health   Financial Resource Strain: Not on file  Food Insecurity: No Food Insecurity (01/09/2023)   Hunger Vital Sign    Worried  About Running Out of Food in the Last Year: Never true    Ran Out of Food in the Last Year: Never true  Transportation Needs: No Transportation Needs (01/09/2023)   PRAPARE - Administrator, Civil Service (Medical): No    Lack of Transportation (Non-Medical): No  Physical Activity: Not on file  Stress: Not on file  Social Connections: Not on file  Intimate Partner Violence: Not At Risk (01/09/2023)   Humiliation, Afraid, Rape, and Kick questionnaire    Fear of Current or Ex-Partner: No    Emotionally Abused: No    Physically Abused: No    Sexually Abused: No     BP (!) 110/58   Pulse 67   Ht 5\' 8"  (1.727 m)   Wt 239 lb (108.4 kg)   SpO2 98%   BMI 36.34 kg/m   Physical Exam:  Chronically ill appearing obese woman, NAD HEENT: Unremarkable Neck:  No JVD, no thyromegally Lymphatics:  No adenopathy Back:  No CVA tenderness Lungs:  Clear with no wheezes HEART:  Regular rate rhythm, no murmurs, no rubs, no clicks Abd:  soft, positive bowel sounds, no organomegally, no rebound, no guarding Ext:  2 plus pulses, no edema, no cyanosis, no clubbing Skin:  No rashes no nodules Neuro:  CN II through XII intact, motor grossly intact  DEVICE  Normal device function.  See PaceArt for details.   Assess/Plan:  1. Sinus node dysfunction - she is asymptomatic s/p PPM insertion. 2. Persistent atrial fib - she is back out of rhythm despite amio and I have asked her to stop the amiodarone . 3. PPM - her [employer] DDD PM is working normally. 4. AVR - she is s/p AVR due to AI. Her valve appears to be working normally.   Pete Brand Izak Anding,MD

## 2023-10-28 ENCOUNTER — Telehealth (HOSPITAL_COMMUNITY): Payer: Self-pay

## 2023-10-28 NOTE — Progress Notes (Signed)
 ADVANCED HF CLINIC NOTE   PCP: Dr Rodrick Clapper  Primary Cardiologist: Hoover Luz, MD  HF Cardiology: Dr. Mitzie Anda  HPI: [name] is a 73 y.o. female with a hx of bicuspid AV/AS with ascending aortic aneurysm s/p resection/grafting of ascending aortic aneurysm and placement of St. Jude mechanical AV conduit/Bentall 2009, persistent atrial fib, bradycardia s/p [employer]  PPM implantation 12/2019, CVAs, multiple falls with prior craniotomy for subdural hematoma, prior chronic CVAs on MRI 2021, bipolar disorder, anemia, depression, debilitation (wheelchair bound), DM, HLD, HTN, mild carotid artery disease 2021, back pain, obesity, memory impairment, and chronic HFrEF.   Admitted 06/2021 with increased shortness of breath in the setting of acute HFrEF. Diuresed with IV lasix . Echo repeated EF 25-30%. Had cath with patent coronaries. Started on ARNi and SGLT2i. Discharged to [facility].   She appears to have gone back into atrial fibrillation some time between 5/22 and 12/22 after amiodarone  was stopped. She has had 3 bad falls (12/21, 2/22, 4/22) resulting in subdural hematomas.  She is, however, still on warfarin.    Follow up 4/23, NYHA II-III, Entresto  increased. Remained in AF and arranged for DCCV. S/p TEE/DCCV 09/23/21 to NSR.  Patient was admitted in 8/24 with AKI, meds were cut back.   Echo was done today and reviewed, EF 55-60%, mild asymmetric septal hypertrophy, mild RV dysfunction, mechanical aortic valve mean gradient 9 mmHg, IVC normal.   Today she returns for AHF follow up with her daughter. Overall feeling ok, lives at [facility]. Denies palpitations, CP, dizziness, or PND/Orthopnea. Occasional BLE edema.  SOB with exertion. Appetite great, avoids salt. Drinks ~16 oz cup of water a day.  No fever or chills. Taking all medications as provided by SNF. Denies ETOH, tobacco or drug use. Does PT PRN at SNF. SBP 90-150s. Weight 236-24.   ECG (personally reviewed): a fib LBBB  4/25  PMH: 1. Bicuspid aortic valve with ascending aortic aneurysm: S/p St Jude mechanical aortic valve with Bentall procedure in 2009 2. Atrial fibrillation: Persistent. DCCV to NSR in 12/21.  Had maintained NSR on amiodarone  but was stopped.  - s/p TEE/DCCV 5/23 to NSR 3. Bradycardia: [employer] PPM with left bundle lead in 3/21.  4. CVAs: Now wheelchair-bound.  5. Multiple falls: 12/21 with subdural hematoma requiring craniotomy, 2/22 with subdural, 4/22 with subdural.  6. Bipolar disorder 7. Dementia 8. Hypothyroidism 9. CKD stage 3 10. Chronic systolic CHF: Nonischemic cardiomyopathy.  - Echo (11/22): EF 40-45%.  - LHC/RHC (2/23): No CAD; mean RA 12, PA 45/22, PCWP mean 25, CI 3.5 - Echo (2/23): EF 25-30%, global HK, RV normal, mild MR, St Jude mechanical AVR with normal function.  - TEE (5/23): EF 35-40%, RV mildly reduced, mild to moderate MR, St Jude mechanical AVR with normal function. - Echo (9/24): EF 55-60%, mild asymmetric septal hypertrophy, mild RV dysfunction, mechanical aortic valve mean gradient 9 mmHg, IVC normal.  11. Hyperlipidemia 12. CKD stage 3 13. L-spine athritis   Review of Systems: All systems reviewed and negative except as per HPI.   Current Outpatient Medications  Medication Sig Dispense Refill   acetaminophen  (TYLENOL ) 500 MG tablet Take 1,000 mg by mouth in the morning, at noon, and at bedtime.     BELSOMRA 20 MG TABS Take 1 tablet by mouth at bedtime.     benzonatate  (TESSALON ) 100 MG capsule Take 100 mg by mouth 3 (three) times daily.     carvedilol  (COREG ) 6.25 MG tablet Take 6.25  mg by mouth 2 (two) times daily.     dapagliflozin  propanediol (FARXIGA ) 10 MG TABS tablet Take 1 tablet (10 mg total) by mouth daily. 30 tablet 0   DULoxetine (CYMBALTA) 60 MG capsule Take 60 mg by mouth daily.     estradiol  (ESTRACE ) 0.1 MG/GM vaginal cream Place 1 Applicatorful vaginally every Monday, Wednesday, and Friday.     fenofibrate  160 MG tablet Take 1  tablet (160 mg total) by mouth daily.     fexofenadine (ALLEGRA) 60 MG tablet Take 60 mg by mouth daily.     hydrocortisone  cream 1 % Apply 1 Application topically 2 (two) times daily. Apply to abdominal area and to top of breasts for itching// not in skin folds     lamoTRIgine  (LAMICTAL ) 100 MG tablet Take 100 mg by mouth daily.     lamoTRIgine  (LAMICTAL ) 200 MG tablet Take 200 mg by mouth at bedtime.     levothyroxine  (SYNTHROID ) 50 MCG tablet Take 50 mcg by mouth daily before breakfast.     lidocaine  4 % Place 1 patch onto the skin daily. To left lower back     meclizine (ANTIVERT) 25 MG tablet Take 25 mg by mouth daily as needed for dizziness (vertigo).     methocarbamol (ROBAXIN) 500 MG tablet Take 500 mg by mouth 3 (three) times daily.     pantoprazole  (PROTONIX ) 20 MG tablet Take 20 mg by mouth every other day.     polyvinyl alcohol  (LIQUIFILM TEARS) 1.4 % ophthalmic solution Place 1 drop into both eyes in the morning, at noon, and at bedtime.     potassium chloride  SA (KLOR-CON  M) 20 MEQ tablet Take 1 tablet (20 mEq total) by mouth daily.     Propylene Glycol 0.6 % SOLN Apply 1 drop to eye at bedtime.     rosuvastatin  (CRESTOR ) 40 MG tablet Take 40 mg by mouth at bedtime.     sacubitril -valsartan  (ENTRESTO ) 24-26 MG Take 1 tablet by mouth 2 (two) times daily.     spironolactone  (ALDACTONE ) 25 MG tablet Take 0.5 tablets (12.5 mg total) by mouth daily. 30 tablet 0   traZODone  (DESYREL ) 100 MG tablet Take 100 mg by mouth daily. TDD: 150 mg     traZODone  (DESYREL ) 50 MG tablet Take 50 mg by mouth at bedtime. TDD: 150 mg     vitamin B-12 (CYANOCOBALAMIN ) 1000 MCG tablet Take 1,000 mcg by mouth every Monday.     warfarin (COUMADIN ) 2 MG tablet Take 2 mg by mouth daily at 12 noon.     furosemide  (LASIX ) 20 MG tablet Take 1 tablet (20 mg total) by mouth daily.     levalbuterol (XOPENEX) 0.63 MG/3ML nebulizer solution Take 0.63 mg by nebulization daily as needed for wheezing or shortness of  breath.     miconazole (MICOTIN) 2 % powder Apply 1 Application topically in the morning and at bedtime. Liberal dusting, topical, twice a day, apply to folds of groin, abdominal skin folds and under the breasts yeast (Patient not taking: Reported on 10/29/2023)     ondansetron  (ZOFRAN -ODT) 4 MG disintegrating tablet Take 4 mg by mouth every 8 (eight) hours as needed for vomiting, nausea or refractory nausea / vomiting. (Patient not taking: Reported on 10/22/2023)     No current facility-administered medications for this encounter.   Allergies  Allergen Reactions   Mucinex [Guaifenesin Er] Other (See Comments)    Severe headaches    Keflex  [Cephalexin ] Other (See Comments)    Headaches Dizziness  Mucinex [Guaifenesin Er] Other (See Comments)    Severe headaches   Sulfa Antibiotics Other (See Comments)    Headaches    Sulfonamide Derivatives Other (See Comments)    Headaches    Social History   Socioeconomic History   Marital status: Widowed    Spouse name: Not on file   Number of children: Not on file   Years of education: Not on file   Highest education level: Not on file  Occupational History   Not on file  Tobacco Use   Smoking status: Former    Current packs/day: 0.00    Types: Cigarettes    Quit date: 05/26/1990    Years since quitting: 33.4   Smokeless tobacco: Never  Vaping Use   Vaping status: Never Used  Substance and Sexual Activity   Alcohol  use: Yes    Alcohol /week: 0.0 standard drinks of alcohol     Comment: rare   Drug use: No   Sexual activity: Not on file  Other Topics Concern   Not on file  Social History Narrative   ** Merged History Encounter **       Social Drivers of Health   Financial Resource Strain: Not on file  Food Insecurity: No Food Insecurity (01/09/2023)   Hunger Vital Sign    Worried About Running Out of Food in the Last Year: Never true    Ran Out of Food in the Last Year: Never true  Transportation Needs: No Transportation Needs  (01/09/2023)   PRAPARE - Administrator, Civil Service (Medical): No    Lack of Transportation (Non-Medical): No  Physical Activity: Not on file  Stress: Not on file  Social Connections: Not on file  Intimate Partner Violence: Not At Risk (01/09/2023)   Humiliation, Afraid, Rape, and Kick questionnaire    Fear of Current or Ex-Partner: No    Emotionally Abused: No    Physically Abused: No    Sexually Abused: No   Family History  Problem Relation Age of Onset   Scoliosis Mother    Arthritis Mother        Rheumatoid   Aneurysm Mother        heart   Alcohol  abuse Father    Depression Sister    Depression Brother    Alcohol  abuse Brother    Cancer Maternal Grandmother        colon   Diabetes Maternal Grandfather    Heart disease Maternal Grandfather    Alcohol  abuse Maternal Grandfather    Depression Paternal Grandmother    Diabetes Paternal Grandmother    Depression Paternal Grandfather    Diabetes Paternal Grandfather    Depression Sister    Alcohol  abuse Brother    Depression Brother    Heart disease Brother    BP (!) 98/48 (BP Location: Right Arm, Patient Position: Sitting, Cuff Size: Normal)   Pulse 82   Wt 109.2 kg (240 lb 12.8 oz)   SpO2 95%   BMI 36.61 kg/m   Wt Readings from Last 3 Encounters:  10/29/23 109.2 kg (240 lb 12.8 oz)  10/22/23 108.4 kg (239 lb)  08/24/23 104.3 kg (230 lb)   Physical exam:  General:  elderly appearing.  No respiratory difficulty. Arrived in [facility] Neck: supple. JVD ~7 cm.  Cor: PMI nondisplaced. Regular rate & rhythm. No rubs, gallops or murmurs. +mechanical valve sounds Lungs: clear Extremities: no cyanosis, clubbing, rash, +2 BLE edema  Neuro: alert & oriented x 3. Moves all 4 extremities  w/o difficulty. Affect pleasant.   ASSESSMENT & PLAN: 1. Chronic systolic CHF: Nonischemic cardiomyopathy.  Cath 06/2020 without significant coronary disease.  06/2020 echo with EF 25-30%, previous echo 03/2021 with EF 40-45%. She has  a Environmental manager PPM but rarely paces her RV.  TEE (5/23) EF 35-40%. Echo today showed improved EF 55-60%, mild asymmetric septal hypertrophy, mild RV dysfunction, mechanical aortic valve mean gradient 9 mmHg, IVC normal.  - NYHA class III symptoms but seems most limited by deconditioning and orthopedic problems.   - Had not been getting lasix  at all and SNF. She is mildly volume overloaded on exam today. Will restart Lasix  20 mg daily and I will have her take 40 mg for 2 days.  BMET/BNP today.  - Continue carvedilol  6.25 mg bid. - Continue Entresto  24/26 mg bid.  - Continue spironolactone  12.5 mg daily.  - Continue Farxiga  10 mg daily.  2. CKD Stage III: BMET today, gets labs regularly at SNF.  3. Atrial fibrillation: Persistent.  Patient was in NSR in 5/22 but was in atrial fibrillation in 2/23. Her worsening HF may be due at least in part to development of persistent AF. S/p TEE/DCCV to NSR 5/23. - Now off amiodarone  per EP - Continue warfarin.  4. Mechanical Aortic Valve: On warfarin, goal INR 2.5-3.5 with h/o atrial fibrillation.  Stable valve on echo. 5. Tachy/Brady Syndrome: [employer] PPM, she has a left bundle lead, rarely paces.   6. Falls: Frequent in past with SDH.  Now in SNF, seems to be doing better with no recent falls but minimally mobile.  She has remained on warfarin given mechanical AoV.  7. Poorly healing left ankle ulcer. Per PCP 8. Anemia: Iron studies stable 9/24. Repeat with persistent fatigue.   Follow up in 3 months with Dr. Mitzie Anda.   Sheryl Donna  10/29/2023

## 2023-10-28 NOTE — Telephone Encounter (Signed)
 Called and spoke to pt's daughter Amy to confirm/remind patient of their appointment at [facility] on 10/29/23.   Appointment:   [x] Confirmed  [] Left mess   [] No answer/No voice mail  [] VM Full/unable to leave message  [] Phone not in service  Patient reminded to bring all medications and/or complete list.  Confirmed patient has transportation. Gave directions, instructed to utilize valet parking.

## 2023-10-29 ENCOUNTER — Encounter (HOSPITAL_COMMUNITY): Payer: Self-pay

## 2023-10-29 ENCOUNTER — Ambulatory Visit (HOSPITAL_COMMUNITY)
Admission: RE | Admit: 2023-10-29 | Discharge: 2023-10-29 | Disposition: A | Discharge status: Home / Self Care | Source: Ambulatory Visit | Attending: Internal Medicine | Admitting: Internal Medicine

## 2023-10-29 ENCOUNTER — Ambulatory Visit (HOSPITAL_COMMUNITY): Payer: Self-pay | Admitting: Internal Medicine

## 2023-10-29 VITALS — BP 98/48 | HR 82 | Wt 240.8 lb

## 2023-10-29 DIAGNOSIS — I6523 Occlusion and stenosis of bilateral carotid arteries: Secondary | ICD-10-CM | POA: Insufficient documentation

## 2023-10-29 DIAGNOSIS — R296 Repeated falls: Secondary | ICD-10-CM | POA: Diagnosis not present

## 2023-10-29 DIAGNOSIS — M549 Dorsalgia, unspecified: Secondary | ICD-10-CM | POA: Diagnosis not present

## 2023-10-29 DIAGNOSIS — I495 Sick sinus syndrome: Secondary | ICD-10-CM | POA: Diagnosis not present

## 2023-10-29 DIAGNOSIS — Z952 Presence of prosthetic heart valve: Secondary | ICD-10-CM | POA: Diagnosis not present

## 2023-10-29 DIAGNOSIS — E1122 Type 2 diabetes mellitus with diabetic chronic kidney disease: Secondary | ICD-10-CM | POA: Diagnosis not present

## 2023-10-29 DIAGNOSIS — N183 Chronic kidney disease, stage 3 unspecified: Secondary | ICD-10-CM | POA: Insufficient documentation

## 2023-10-29 DIAGNOSIS — S91009D Unspecified open wound, unspecified ankle, subsequent encounter: Secondary | ICD-10-CM

## 2023-10-29 DIAGNOSIS — I428 Other cardiomyopathies: Secondary | ICD-10-CM | POA: Insufficient documentation

## 2023-10-29 DIAGNOSIS — I4819 Other persistent atrial fibrillation: Secondary | ICD-10-CM | POA: Insufficient documentation

## 2023-10-29 DIAGNOSIS — F04 Amnestic disorder due to known physiological condition: Secondary | ICD-10-CM | POA: Diagnosis not present

## 2023-10-29 DIAGNOSIS — E785 Hyperlipidemia, unspecified: Secondary | ICD-10-CM | POA: Insufficient documentation

## 2023-10-29 DIAGNOSIS — N1831 Chronic kidney disease, stage 3a: Secondary | ICD-10-CM

## 2023-10-29 DIAGNOSIS — E669 Obesity, unspecified: Secondary | ICD-10-CM | POA: Insufficient documentation

## 2023-10-29 DIAGNOSIS — I4892 Unspecified atrial flutter: Secondary | ICD-10-CM | POA: Diagnosis not present

## 2023-10-29 DIAGNOSIS — I130 Hypertensive heart and chronic kidney disease with heart failure and stage 1 through stage 4 chronic kidney disease, or unspecified chronic kidney disease: Secondary | ICD-10-CM | POA: Insufficient documentation

## 2023-10-29 DIAGNOSIS — D631 Anemia in chronic kidney disease: Secondary | ICD-10-CM | POA: Diagnosis not present

## 2023-10-29 DIAGNOSIS — I4891 Unspecified atrial fibrillation: Secondary | ICD-10-CM

## 2023-10-29 DIAGNOSIS — Z950 Presence of cardiac pacemaker: Secondary | ICD-10-CM | POA: Insufficient documentation

## 2023-10-29 DIAGNOSIS — Z993 Dependence on wheelchair: Secondary | ICD-10-CM | POA: Insufficient documentation

## 2023-10-29 DIAGNOSIS — E11621 Type 2 diabetes mellitus with foot ulcer: Secondary | ICD-10-CM | POA: Diagnosis not present

## 2023-10-29 DIAGNOSIS — L97328 Non-pressure chronic ulcer of left ankle with other specified severity: Secondary | ICD-10-CM | POA: Diagnosis not present

## 2023-10-29 DIAGNOSIS — Z7901 Long term (current) use of anticoagulants: Secondary | ICD-10-CM | POA: Insufficient documentation

## 2023-10-29 DIAGNOSIS — Z8673 Personal history of transient ischemic attack (TIA), and cerebral infarction without residual deficits: Secondary | ICD-10-CM | POA: Diagnosis not present

## 2023-10-29 DIAGNOSIS — I5022 Chronic systolic (congestive) heart failure: Secondary | ICD-10-CM | POA: Insufficient documentation

## 2023-10-29 DIAGNOSIS — F319 Bipolar disorder, unspecified: Secondary | ICD-10-CM | POA: Insufficient documentation

## 2023-10-29 DIAGNOSIS — D649 Anemia, unspecified: Secondary | ICD-10-CM

## 2023-10-29 LAB — BASIC METABOLIC PANEL WITH GFR
Anion gap: 8 (ref 5–15)
BUN: 21 mg/dL (ref 8–23)
CO2: 25 mmol/L (ref 22–32)
Calcium: 8.9 mg/dL (ref 8.9–10.3)
Chloride: 104 mmol/L (ref 98–111)
Creatinine, Ser: 1.36 mg/dL — ABNORMAL HIGH (ref 0.44–1.00)
GFR, Estimated: 41 mL/min — ABNORMAL LOW (ref >60)
Glucose, Bld: 97 mg/dL (ref 70–99)
Potassium: 4.1 mmol/L (ref 3.5–5.1)
Sodium: 137 mmol/L (ref 135–145)

## 2023-10-29 LAB — IRON AND TIBC
Iron: 72 ug/dL (ref 28–170)
Saturation Ratios: 13 % (ref 10.4–31.8)
TIBC: 568 ug/dL — ABNORMAL HIGH (ref 250–450)
UIBC: 496 ug/dL

## 2023-10-29 LAB — FERRITIN: Ferritin: 35 ng/mL (ref 11–307)

## 2023-10-29 LAB — BRAIN NATRIURETIC PEPTIDE: B Natriuretic Peptide: 91.6 pg/mL (ref 0.0–100.0)

## 2023-10-29 MED ORDER — FUROSEMIDE 20 MG PO TABS: 20.0000 mg | ORAL_TABLET | Freq: Every day | ORAL | Status: DC

## 2023-10-29 NOTE — Patient Instructions (Signed)
 Following orders sent back to [facility]:  STOP Amiodarone  START Furosemide  40 mg daily for 2 days then 20 mg daily, ok to give as long as she's asymptomatic and SP>85 Follow up in 3 months

## 2023-11-09 DIAGNOSIS — F5104 Psychophysiologic insomnia: Secondary | ICD-10-CM | POA: Diagnosis not present

## 2023-11-09 DIAGNOSIS — F3132 Bipolar disorder, current episode depressed, moderate: Secondary | ICD-10-CM | POA: Diagnosis not present

## 2023-11-10 ENCOUNTER — Ambulatory Visit (INDEPENDENT_AMBULATORY_CARE_PROVIDER_SITE_OTHER): Payer: HMO

## 2023-11-10 ENCOUNTER — Other Ambulatory Visit (HOSPITAL_COMMUNITY): Payer: Self-pay | Admitting: *Deleted

## 2023-11-10 DIAGNOSIS — I5022 Chronic systolic (congestive) heart failure: Secondary | ICD-10-CM

## 2023-11-10 DIAGNOSIS — D649 Anemia, unspecified: Secondary | ICD-10-CM

## 2023-11-10 DIAGNOSIS — I495 Sick sinus syndrome: Secondary | ICD-10-CM

## 2023-11-11 ENCOUNTER — Ambulatory Visit (HOSPITAL_COMMUNITY)
Admission: RE | Admit: 2023-11-11 | Discharge: 2023-11-11 | Disposition: A | Discharge status: Home / Self Care | Source: Ambulatory Visit | Attending: Cardiology | Admitting: Cardiology

## 2023-11-11 DIAGNOSIS — D649 Anemia, unspecified: Secondary | ICD-10-CM | POA: Diagnosis not present

## 2023-11-11 DIAGNOSIS — I5022 Chronic systolic (congestive) heart failure: Secondary | ICD-10-CM | POA: Diagnosis not present

## 2023-11-11 LAB — CUP PACEART REMOTE DEVICE CHECK
Battery Remaining Longevity: 138 mo
Battery Remaining Percentage: 100 %
Brady Statistic RA Percent Paced: 0 %
Brady Statistic RV Percent Paced: 13 %
Date Time Interrogation Session: 20250617023100
Implantable Lead Connection Status: 753985
Implantable Lead Connection Status: 753985
Implantable Lead Implant Date: 20210816
Implantable Lead Implant Date: 20210816
Implantable Lead Location: 753859
Implantable Lead Location: 753860
Implantable Lead Model: 7842
Implantable Lead Model: 7842
Implantable Lead Serial Number: 1013287
Implantable Lead Serial Number: 1091261
Implantable Pulse Generator Implant Date: 20210816
Lead Channel Impedance Value: 440 Ohm
Lead Channel Impedance Value: 708 Ohm
Lead Channel Setting Pacing Amplitude: 2.5 V
Lead Channel Setting Pacing Pulse Width: 0.4 ms
Lead Channel Setting Sensing Sensitivity: 2.5 mV
Pulse Gen Serial Number: 940432
Zone Setting Status: 755011

## 2023-11-11 MED ORDER — SODIUM CHLORIDE 0.9 % IV SOLN
510.0000 mg | Freq: Once | INTRAVENOUS | Status: AC
  Administered 2023-11-11: 510 mg via INTRAVENOUS
  Filled 2023-11-11: qty 510

## 2023-11-12 ENCOUNTER — Ambulatory Visit: Payer: Self-pay | Admitting: Internal Medicine

## 2023-11-23 DIAGNOSIS — I4891 Unspecified atrial fibrillation: Secondary | ICD-10-CM | POA: Diagnosis not present

## 2023-11-23 DIAGNOSIS — D631 Anemia in chronic kidney disease: Secondary | ICD-10-CM | POA: Diagnosis not present

## 2023-11-23 DIAGNOSIS — I5042 Chronic combined systolic (congestive) and diastolic (congestive) heart failure: Secondary | ICD-10-CM | POA: Diagnosis not present

## 2023-11-23 DIAGNOSIS — N1832 Chronic kidney disease, stage 3b: Secondary | ICD-10-CM | POA: Diagnosis not present

## 2023-11-23 DIAGNOSIS — E039 Hypothyroidism, unspecified: Secondary | ICD-10-CM | POA: Diagnosis not present

## 2023-11-26 DIAGNOSIS — E039 Hypothyroidism, unspecified: Secondary | ICD-10-CM | POA: Diagnosis not present

## 2023-12-07 DIAGNOSIS — Z7901 Long term (current) use of anticoagulants: Secondary | ICD-10-CM | POA: Diagnosis not present

## 2023-12-08 DIAGNOSIS — S91002A Unspecified open wound, left ankle, initial encounter: Secondary | ICD-10-CM | POA: Diagnosis not present

## 2023-12-15 DIAGNOSIS — S91002A Unspecified open wound, left ankle, initial encounter: Secondary | ICD-10-CM | POA: Diagnosis not present

## 2023-12-17 DIAGNOSIS — M79601 Pain in right arm: Secondary | ICD-10-CM | POA: Diagnosis not present

## 2023-12-18 DIAGNOSIS — I482 Chronic atrial fibrillation, unspecified: Secondary | ICD-10-CM | POA: Diagnosis not present

## 2023-12-18 DIAGNOSIS — E039 Hypothyroidism, unspecified: Secondary | ICD-10-CM | POA: Diagnosis not present

## 2023-12-18 DIAGNOSIS — N390 Urinary tract infection, site not specified: Secondary | ICD-10-CM | POA: Diagnosis not present

## 2023-12-18 DIAGNOSIS — I5042 Chronic combined systolic (congestive) and diastolic (congestive) heart failure: Secondary | ICD-10-CM | POA: Diagnosis not present

## 2023-12-18 DIAGNOSIS — Z7901 Long term (current) use of anticoagulants: Secondary | ICD-10-CM | POA: Diagnosis not present

## 2023-12-18 DIAGNOSIS — N1832 Chronic kidney disease, stage 3b: Secondary | ICD-10-CM | POA: Diagnosis not present

## 2023-12-18 DIAGNOSIS — Z952 Presence of prosthetic heart valve: Secondary | ICD-10-CM | POA: Diagnosis not present

## 2023-12-18 DIAGNOSIS — M7031 Other bursitis of elbow, right elbow: Secondary | ICD-10-CM | POA: Diagnosis not present

## 2023-12-22 DIAGNOSIS — S91002A Unspecified open wound, left ankle, initial encounter: Secondary | ICD-10-CM | POA: Diagnosis not present

## 2023-12-25 DIAGNOSIS — I5043 Acute on chronic combined systolic (congestive) and diastolic (congestive) heart failure: Secondary | ICD-10-CM | POA: Diagnosis not present

## 2023-12-29 DIAGNOSIS — M726 Necrotizing fasciitis: Secondary | ICD-10-CM | POA: Diagnosis not present

## 2023-12-29 DIAGNOSIS — S91002A Unspecified open wound, left ankle, initial encounter: Secondary | ICD-10-CM | POA: Diagnosis not present

## 2024-01-01 DIAGNOSIS — S91012A Laceration without foreign body, left ankle, initial encounter: Secondary | ICD-10-CM | POA: Diagnosis not present

## 2024-01-04 DIAGNOSIS — F3132 Bipolar disorder, current episode depressed, moderate: Secondary | ICD-10-CM | POA: Diagnosis not present

## 2024-01-04 DIAGNOSIS — F5104 Psychophysiologic insomnia: Secondary | ICD-10-CM | POA: Diagnosis not present

## 2024-01-05 DIAGNOSIS — S91002A Unspecified open wound, left ankle, initial encounter: Secondary | ICD-10-CM | POA: Diagnosis not present

## 2024-01-06 DIAGNOSIS — H35013 Changes in retinal vascular appearance, bilateral: Secondary | ICD-10-CM | POA: Diagnosis not present

## 2024-01-06 DIAGNOSIS — E119 Type 2 diabetes mellitus without complications: Secondary | ICD-10-CM | POA: Diagnosis not present

## 2024-01-12 DIAGNOSIS — S91002A Unspecified open wound, left ankle, initial encounter: Secondary | ICD-10-CM | POA: Diagnosis not present

## 2024-01-15 DIAGNOSIS — I130 Hypertensive heart and chronic kidney disease with heart failure and stage 1 through stage 4 chronic kidney disease, or unspecified chronic kidney disease: Secondary | ICD-10-CM | POA: Diagnosis not present

## 2024-01-15 DIAGNOSIS — N1832 Chronic kidney disease, stage 3b: Secondary | ICD-10-CM | POA: Diagnosis not present

## 2024-01-15 DIAGNOSIS — G4700 Insomnia, unspecified: Secondary | ICD-10-CM | POA: Diagnosis not present

## 2024-01-15 DIAGNOSIS — I5042 Chronic combined systolic (congestive) and diastolic (congestive) heart failure: Secondary | ICD-10-CM | POA: Diagnosis not present

## 2024-01-15 DIAGNOSIS — M7031 Other bursitis of elbow, right elbow: Secondary | ICD-10-CM | POA: Diagnosis not present

## 2024-01-22 NOTE — Addendum Note (Signed)
 Addended by: VICCI SELLER A on: 01/22/2024 03:19 PM   Modules accepted: Orders

## 2024-01-22 NOTE — Progress Notes (Signed)
 Remote pacemaker transmission.

## 2024-01-26 DIAGNOSIS — S91002A Unspecified open wound, left ankle, initial encounter: Secondary | ICD-10-CM | POA: Diagnosis not present

## 2024-02-01 DIAGNOSIS — I5042 Chronic combined systolic (congestive) and diastolic (congestive) heart failure: Secondary | ICD-10-CM | POA: Diagnosis not present

## 2024-02-01 DIAGNOSIS — I130 Hypertensive heart and chronic kidney disease with heart failure and stage 1 through stage 4 chronic kidney disease, or unspecified chronic kidney disease: Secondary | ICD-10-CM | POA: Diagnosis not present

## 2024-02-01 DIAGNOSIS — Z5181 Encounter for therapeutic drug level monitoring: Secondary | ICD-10-CM | POA: Diagnosis not present

## 2024-02-01 DIAGNOSIS — I482 Chronic atrial fibrillation, unspecified: Secondary | ICD-10-CM | POA: Diagnosis not present

## 2024-02-01 DIAGNOSIS — M7031 Other bursitis of elbow, right elbow: Secondary | ICD-10-CM | POA: Diagnosis not present

## 2024-02-01 DIAGNOSIS — N189 Chronic kidney disease, unspecified: Secondary | ICD-10-CM | POA: Diagnosis not present

## 2024-02-01 DIAGNOSIS — Z7901 Long term (current) use of anticoagulants: Secondary | ICD-10-CM | POA: Diagnosis not present

## 2024-02-01 DIAGNOSIS — W57XXXA Bitten or stung by nonvenomous insect and other nonvenomous arthropods, initial encounter: Secondary | ICD-10-CM | POA: Diagnosis not present

## 2024-02-02 ENCOUNTER — Ambulatory Visit (HOSPITAL_COMMUNITY): Payer: Self-pay | Admitting: Cardiology

## 2024-02-02 ENCOUNTER — Ambulatory Visit (HOSPITAL_COMMUNITY)
Admission: RE | Admit: 2024-02-02 | Discharge: 2024-02-02 | Disposition: A | Discharge status: Home / Self Care | Source: Ambulatory Visit | Attending: Cardiology | Admitting: Cardiology

## 2024-02-02 VITALS — BP 110/60 | HR 76 | Wt 241.6 lb

## 2024-02-02 DIAGNOSIS — R296 Repeated falls: Secondary | ICD-10-CM | POA: Diagnosis not present

## 2024-02-02 DIAGNOSIS — I428 Other cardiomyopathies: Secondary | ICD-10-CM | POA: Diagnosis not present

## 2024-02-02 DIAGNOSIS — D631 Anemia in chronic kidney disease: Secondary | ICD-10-CM | POA: Insufficient documentation

## 2024-02-02 DIAGNOSIS — I495 Sick sinus syndrome: Secondary | ICD-10-CM | POA: Diagnosis not present

## 2024-02-02 DIAGNOSIS — R413 Other amnesia: Secondary | ICD-10-CM | POA: Insufficient documentation

## 2024-02-02 DIAGNOSIS — H9041 Sensorineural hearing loss, unilateral, right ear, with unrestricted hearing on the contralateral side: Secondary | ICD-10-CM | POA: Diagnosis not present

## 2024-02-02 DIAGNOSIS — L97329 Non-pressure chronic ulcer of left ankle with unspecified severity: Secondary | ICD-10-CM | POA: Insufficient documentation

## 2024-02-02 DIAGNOSIS — E1122 Type 2 diabetes mellitus with diabetic chronic kidney disease: Secondary | ICD-10-CM | POA: Diagnosis not present

## 2024-02-02 DIAGNOSIS — Z79899 Other long term (current) drug therapy: Secondary | ICD-10-CM | POA: Insufficient documentation

## 2024-02-02 DIAGNOSIS — Z95810 Presence of automatic (implantable) cardiac defibrillator: Secondary | ICD-10-CM | POA: Insufficient documentation

## 2024-02-02 DIAGNOSIS — Z7901 Long term (current) use of anticoagulants: Secondary | ICD-10-CM | POA: Insufficient documentation

## 2024-02-02 DIAGNOSIS — I739 Peripheral vascular disease, unspecified: Secondary | ICD-10-CM | POA: Diagnosis not present

## 2024-02-02 DIAGNOSIS — F319 Bipolar disorder, unspecified: Secondary | ICD-10-CM | POA: Diagnosis not present

## 2024-02-02 DIAGNOSIS — I4821 Permanent atrial fibrillation: Secondary | ICD-10-CM | POA: Insufficient documentation

## 2024-02-02 DIAGNOSIS — I130 Hypertensive heart and chronic kidney disease with heart failure and stage 1 through stage 4 chronic kidney disease, or unspecified chronic kidney disease: Secondary | ICD-10-CM | POA: Insufficient documentation

## 2024-02-02 DIAGNOSIS — E11622 Type 2 diabetes mellitus with other skin ulcer: Secondary | ICD-10-CM | POA: Insufficient documentation

## 2024-02-02 DIAGNOSIS — Z8673 Personal history of transient ischemic attack (TIA), and cerebral infarction without residual deficits: Secondary | ICD-10-CM | POA: Insufficient documentation

## 2024-02-02 DIAGNOSIS — I5022 Chronic systolic (congestive) heart failure: Secondary | ICD-10-CM | POA: Insufficient documentation

## 2024-02-02 DIAGNOSIS — N183 Chronic kidney disease, stage 3 unspecified: Secondary | ICD-10-CM | POA: Diagnosis not present

## 2024-02-02 DIAGNOSIS — E669 Obesity, unspecified: Secondary | ICD-10-CM | POA: Diagnosis not present

## 2024-02-02 DIAGNOSIS — Z993 Dependence on wheelchair: Secondary | ICD-10-CM | POA: Insufficient documentation

## 2024-02-02 DIAGNOSIS — I2510 Atherosclerotic heart disease of native coronary artery without angina pectoris: Secondary | ICD-10-CM | POA: Diagnosis not present

## 2024-02-02 DIAGNOSIS — E785 Hyperlipidemia, unspecified: Secondary | ICD-10-CM | POA: Insufficient documentation

## 2024-02-02 DIAGNOSIS — M549 Dorsalgia, unspecified: Secondary | ICD-10-CM | POA: Diagnosis not present

## 2024-02-02 DIAGNOSIS — Z955 Presence of coronary angioplasty implant and graft: Secondary | ICD-10-CM | POA: Insufficient documentation

## 2024-02-02 LAB — BASIC METABOLIC PANEL WITH GFR
Anion gap: 12 (ref 5–15)
BUN: 36 mg/dL — ABNORMAL HIGH (ref 8–23)
CO2: 29 mmol/L (ref 22–32)
Calcium: 9.4 mg/dL (ref 8.9–10.3)
Chloride: 98 mmol/L (ref 98–111)
Creatinine, Ser: 1.58 mg/dL — ABNORMAL HIGH (ref 0.44–1.00)
GFR, Estimated: 34 mL/min — ABNORMAL LOW (ref >60)
Glucose, Bld: 133 mg/dL — ABNORMAL HIGH (ref 70–99)
Potassium: 4.2 mmol/L (ref 3.5–5.1)
Sodium: 139 mmol/L (ref 135–145)

## 2024-02-02 LAB — LIPID PANEL
Cholesterol: 132 mg/dL (ref 0–200)
HDL: 45 mg/dL (ref >40)
LDL Cholesterol: 37 mg/dL (ref 0–99)
Total CHOL/HDL Ratio: 2.9 ratio
Triglycerides: 252 mg/dL — ABNORMAL HIGH (ref <150)
VLDL: 50 mg/dL — ABNORMAL HIGH (ref 0–40)

## 2024-02-02 LAB — BRAIN NATRIURETIC PEPTIDE: B Natriuretic Peptide: 42.4 pg/mL (ref 0.0–100.0)

## 2024-02-02 MED ORDER — SPIRONOLACTONE 25 MG PO TABS: 25.0000 mg | ORAL_TABLET | Freq: Every day | ORAL | 0 refills | Status: AC

## 2024-02-02 NOTE — Progress Notes (Signed)
 ReDS Vest / Clip - 02/02/24 1400       ReDS Vest / Clip   Station Marker D    Ruler Value 35    ReDS Value Range Low volume    ReDS Actual Value 25

## 2024-02-02 NOTE — Patient Instructions (Addendum)
 Good  to see you today!  INCREASE Spironolactone  to 25 mg daily.  Labs done today, your results will be available in MyChart, we will contact you for abnormal readings.  Your provider has ordered a pressure of your legs test. They will call you to arrange this test.  Your physician recommends that you schedule a follow-up appointment  If you have any questions or concerns before your next appointment please send us  a message through mychart or call our office at [phone].    TO LEAVE A MESSAGE FOR THE NURSE SELECT OPTION 2, PLEASE LEAVE A MESSAGE INCLUDING: YOUR NAME DATE OF BIRTH CALL BACK NUMBER REASON FOR CALL**this is important as we prioritize the call backs  YOU WILL RECEIVE A CALL BACK THE SAME DAY AS LONG AS YOU CALL BEFORE 4:00 PM At the [facility], you and your health needs are our priority. As part of our continuing mission to provide you with exceptional heart care, we have created designated Provider Care Teams. These Care Teams include your primary Cardiologist (physician) and Advanced Practice Providers (APPs- Physician Assistants and Nurse Practitioners) who all work together to provide you with the care you need, when you need it.   You may see any of the following providers on your designated Care Team at your next follow up: Dr Toribio Fuel Dr Ezra Shuck Dr. Ria Commander Dr. Morene Brownie Amy Lenetta, NP Caffie Shed, [place] [facility], [place] Beckey Coe, NP [place] Lee, NP Ellouise Class, NP Tinnie Redman, PharmD Jaun Bash, PharmD   Please be sure to bring in all your medications bottles to every appointment.    Thank you for choosing [facility]

## 2024-02-03 NOTE — Addendum Note (Signed)
 Encounter addended by: Rolan Ezra RAMAN, MD on: 02/03/2024 2:41 PM  Actions taken: Clinical Note Signed, Level of Service modified

## 2024-02-03 NOTE — Progress Notes (Signed)
 ADVANCED HF CLINIC NOTE   PCP: Dr Domenica  Primary Cardiologist: Feliciano Devoria LABOR, MD  HF Cardiology: Dr. Rolan  Chief complaint: CHF  HPI: [name] is a 73 y.o. female with a hx of bicuspid AV/AS with ascending aortic aneurysm s/p resection/grafting of ascending aortic aneurysm and placement of St. Jude mechanical AV conduit/Bentall 2009, persistent atrial fib, bradycardia s/p [employer]  PPM implantation 12/2019, CVAs, multiple falls with prior craniotomy for subdural hematoma, prior chronic CVAs on MRI 2021, bipolar disorder, anemia, depression, debilitation (wheelchair bound), DM, HLD, HTN, mild carotid artery disease 2021, back pain, obesity, memory impairment, and chronic HFrEF.   Admitted 06/2021 with increased shortness of breath in the setting of acute HFrEF. Diuresed with IV lasix . Echo repeated EF 25-30%. Had cath with patent coronaries. Started on ARNi and SGLT2i. Discharged to [facility].   She appears to have gone back into atrial fibrillation some time between 5/22 and 12/22 after amiodarone  was stopped. She has had 3 bad falls (12/21, 2/22, 4/22) resulting in subdural hematomas.  She is, however, still on warfarin.    Follow up 4/23, NYHA II-III, Entresto  increased. Remained in AF and arranged for DCCV. S/p TEE/DCCV 09/23/21 to NSR.  Patient was admitted in 8/24 with AKI, meds were cut back.   Echo in 9/24 showed EF 55-60%, mild asymmetric septal hypertrophy, mild RV dysfunction, mechanical aortic valve mean gradient 9 mmHg, IVC normal.   Today she returns for AHF follow up with her daughter. Weight stable.  Mild dyspnea with showering, does ok getting dressed. Uses wheelchair or walker due to unsteadiness/poor balance.  She does not walk far, denies dyspnea with short distances with walker.  She lives at a SNF.  She has a left ankle ulcer being treated by wound care.  She is now in permanent atrial fibrillation, amiodarone  stopped by EP.   ECG (personally  reviewed): Atrial fibrillation with PVC  REDS clip 25%  Labs (6/25): BNP 220, K 4.1, creatinine 1.36  PMH: 1. Bicuspid aortic valve with ascending aortic aneurysm: S/p St Jude mechanical aortic valve with Bentall procedure in 2009 2. Atrial fibrillation: DCCV to NSR in 12/21.   - s/p TEE/DCCV 5/23 to NSR - Now permanent, off amiodarone .  3. Bradycardia: [employer] PPM with left bundle lead in 3/21.  4. CVAs: Now wheelchair-bound.  5. Multiple falls: 12/21 with subdural hematoma requiring craniotomy, 2/22 with subdural, 4/22 with subdural.  6. Bipolar disorder 7. Dementia 8. Hypothyroidism 9. CKD stage 3 10. Chronic systolic CHF: Nonischemic cardiomyopathy.  - Echo (11/22): EF 40-45%.  - LHC/RHC (2/23): No CAD; mean RA 12, PA 45/22, PCWP mean 25, CI 3.5 - Echo (2/23): EF 25-30%, global HK, RV normal, mild MR, St Jude mechanical AVR with normal function.  - TEE (5/23): EF 35-40%, RV mildly reduced, mild to moderate MR, St Jude mechanical AVR with normal function. - Echo (9/24): EF 55-60%, mild asymmetric septal hypertrophy, mild RV dysfunction, mechanical aortic valve mean gradient 9 mmHg, IVC normal.  11. Hyperlipidemia 12. CKD stage 3 13. L-spine athritis   Review of Systems: All systems reviewed and negative except as per HPI.   Current Outpatient Medications  Medication Sig Dispense Refill   acetaminophen  (TYLENOL ) 500 MG tablet Take 1,000 mg by mouth in the morning, at noon, and at bedtime.     BELSOMRA 20 MG TABS Take 1 tablet by mouth at bedtime.     carvedilol  (COREG ) 6.25 MG tablet Take 6.25 mg by  mouth 2 (two) times daily.     dapagliflozin  propanediol (FARXIGA ) 10 MG TABS tablet Take 1 tablet (10 mg total) by mouth daily. 30 tablet 0   DULoxetine (CYMBALTA) 60 MG capsule Take 60 mg by mouth daily.     estradiol  (ESTRACE ) 0.1 MG/GM vaginal cream Place 1 Applicatorful vaginally every Monday, Wednesday, and Friday.     fenofibrate  160 MG tablet Take 1 tablet (160 mg  total) by mouth daily.     fexofenadine (ALLEGRA) 60 MG tablet Take 60 mg by mouth daily.     hydrocortisone  cream 1 % Apply 1 Application topically 2 (two) times daily. Apply to abdominal area and to top of breasts for itching// not in skin folds     lamoTRIgine  (LAMICTAL ) 100 MG tablet Take 100 mg by mouth daily.     lamoTRIgine  (LAMICTAL ) 200 MG tablet Take 200 mg by mouth at bedtime.     levothyroxine  (SYNTHROID ) 50 MCG tablet Take 50 mcg by mouth daily before breakfast.     methocarbamol (ROBAXIN) 500 MG tablet Take 500 mg by mouth 2 (two) times daily as needed for muscle spasms.     pantoprazole  (PROTONIX ) 20 MG tablet Take 20 mg by mouth every other day.     polyvinyl alcohol  (LIQUIFILM TEARS) 1.4 % ophthalmic solution Place 1 drop into both eyes in the morning, at noon, and at bedtime.     potassium chloride  SA (KLOR-CON  M) 20 MEQ tablet Take 1 tablet (20 mEq total) by mouth daily.     Propylene Glycol 0.6 % SOLN Apply 1 drop to eye at bedtime.     rosuvastatin  (CRESTOR ) 40 MG tablet Take 40 mg by mouth at bedtime.     sacubitril -valsartan  (ENTRESTO ) 24-26 MG Take 1 tablet by mouth 2 (two) times daily.     torsemide (DEMADEX) 20 MG tablet Take 40 mg by mouth daily.     traZODone  (DESYREL ) 100 MG tablet Take 100 mg by mouth daily. TDD: 150 mg     traZODone  (DESYREL ) 50 MG tablet Take 50 mg by mouth at bedtime. TDD: 150 mg     vitamin B-12 (CYANOCOBALAMIN ) 1000 MCG tablet Take 1,000 mcg by mouth every Monday.     warfarin (COUMADIN ) 2 MG tablet Take 2 mg by mouth daily at 12 noon.     benzonatate  (TESSALON ) 100 MG capsule Take 100 mg by mouth 3 (three) times daily.     furosemide  (LASIX ) 20 MG tablet Take 1 tablet (20 mg total) by mouth daily.     levalbuterol (XOPENEX) 0.63 MG/3ML nebulizer solution Take 0.63 mg by nebulization daily as needed for wheezing or shortness of breath.     meclizine (ANTIVERT) 25 MG tablet Take 25 mg by mouth daily as needed for dizziness (vertigo). (Patient  not taking: Reported on 02/02/2024)     spironolactone  (ALDACTONE ) 25 MG tablet Take 1 tablet (25 mg total) by mouth daily. 30 tablet 0   No current facility-administered medications for this encounter.   Allergies  Allergen Reactions   Mucinex [Guaifenesin Er] Other (See Comments)    Severe headaches    Keflex  [Cephalexin ] Other (See Comments)    Headaches Dizziness    Mucinex [Guaifenesin Er] Other (See Comments)    Severe headaches   Sulfa Antibiotics Other (See Comments)    Headaches    Sulfonamide Derivatives Other (See Comments)    Headaches    Social History   Socioeconomic History   Marital status: Widowed    Spouse name:  Not on file   Number of children: Not on file   Years of education: Not on file   Highest education level: Not on file  Occupational History   Not on file  Tobacco Use   Smoking status: Former    Current packs/day: 0.00    Types: Cigarettes    Quit date: 05/26/1990    Years since quitting: 33.7   Smokeless tobacco: Never  Vaping Use   Vaping status: Never Used  Substance and Sexual Activity   Alcohol  use: Yes    Alcohol /week: 0.0 standard drinks of alcohol     Comment: rare   Drug use: No   Sexual activity: Not on file  Other Topics Concern   Not on file  Social History Narrative   ** Merged History Encounter **       Social Drivers of Health   Financial Resource Strain: Not on file  Food Insecurity: No Food Insecurity (01/09/2023)   Hunger Vital Sign    Worried About Running Out of Food in the Last Year: Never true    Ran Out of Food in the Last Year: Never true  Transportation Needs: No Transportation Needs (01/09/2023)   PRAPARE - Administrator, Civil Service (Medical): No    Lack of Transportation (Non-Medical): No  Physical Activity: Not on file  Stress: Not on file  Social Connections: Not on file  Intimate Partner Violence: Not At Risk (01/09/2023)   Humiliation, Afraid, Rape, and Kick questionnaire    Fear of  Current or Ex-Partner: No    Emotionally Abused: No    Physically Abused: No    Sexually Abused: No   Family History  Problem Relation Age of Onset   Scoliosis Mother    Arthritis Mother        Rheumatoid   Aneurysm Mother        heart   Alcohol  abuse Father    Depression Sister    Depression Brother    Alcohol  abuse Brother    Cancer Maternal Grandmother        colon   Diabetes Maternal Grandfather    Heart disease Maternal Grandfather    Alcohol  abuse Maternal Grandfather    Depression Paternal Grandmother    Diabetes Paternal Grandmother    Depression Paternal Grandfather    Diabetes Paternal Grandfather    Depression Sister    Alcohol  abuse Brother    Depression Brother    Heart disease Brother    BP 110/60   Pulse 76   Wt 109.6 kg (241 lb 9.6 oz)   SpO2 96%   BMI 36.74 kg/m   Wt Readings from Last 3 Encounters:  02/02/24 109.6 kg (241 lb 9.6 oz)  10/29/23 109.2 kg (240 lb 12.8 oz)  10/22/23 108.4 kg (239 lb)  General: NAD Neck: JVP 8 cm, no thyromegaly or thyroid  nodule.  Lungs: Clear to auscultation bilaterally with normal respiratory effort. CV: Nondisplaced PMI.  Heart regular S1/S2 with mechanical S2, no S3/S4, 2/6 SEM RUSB.  1+ ankle edema.  No carotid bruit.  Normal pedal pulses.  Abdomen: Soft, nontender, no hepatosplenomegaly, no distention.  Skin: Intact without lesions or rashes.  Neurologic: Alert and oriented x 3.  Psych: Normal affect. Extremities: No clubbing or cyanosis. Left ankle ulceration.  HEENT: Normal.   ASSESSMENT & PLAN: 1. Chronic systolic CHF: Nonischemic cardiomyopathy.  Cath 06/2020 without significant coronary disease.  06/2020 echo with EF 25-30%, previous echo 03/2021 with EF 40-45%. 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  TEE (5/23) EF 35-40%. Echo in 9/24 showed improved EF 55-60%, mild asymmetric septal hypertrophy, mild RV dysfunction, mechanical aortic valve mean gradient 9 mmHg, IVC normal. NYHA class II,  limited more by deconditioning and orthopedic problems.  Minimally volume overloaded on exam, REDS clip not elevated.  - Continue torsemide 40 mg daily, BMET/BNP today.   - Continue carvedilol  6.25 mg bid. - Continue Entresto  24/26 mg bid.  - Increase spironolactone  to 25 mg daily.  BMET in 10 days.  - Continue Farxiga  10 mg daily.  - I will arrange for repeat echo this year.  2. CKD Stage III: BMET today, gets labs regularly at SNF.  3. Atrial fibrillation:  Now permanent. S/p TEE/DCCV to NSR 5/23. She was on amiodarone  but this was stopped with breakthrough AF.  She is now off amiodarone .  - Continue warfarin.  4. Mechanical Aortic Valve: On warfarin, goal INR 2.5-3.5 with h/o atrial fibrillation.  Stable valve on echo in 9/24. 5. Tachy/Brady Syndrome: [employer] PPM, she has a left bundle lead, rarely paces.   6. Falls: Frequent in past with SDH.  Now in SNF, seems to be doing better with no recent falls but minimally mobile.  She has remained on warfarin given mechanical AoV.  7. Poorly healing left ankle ulcer: Followed by wound care.  - I will arrange for peripheral arterial doppler evaluation.   Follow up in 3 months APP  I spent 31 minutes reviewing data and LVAD parameters, interviewing patient, and organizing the orders/followup.   Ezra Shuck  02/03/2024

## 2024-02-03 NOTE — Addendum Note (Signed)
 Encounter addended by: Rolan Ezra RAMAN, MD on: 02/03/2024 1:56 PM  Actions taken: Pend clinical note

## 2024-02-09 ENCOUNTER — Ambulatory Visit: Payer: HMO

## 2024-02-09 DIAGNOSIS — S91002A Unspecified open wound, left ankle, initial encounter: Secondary | ICD-10-CM | POA: Diagnosis not present

## 2024-02-09 DIAGNOSIS — I5022 Chronic systolic (congestive) heart failure: Secondary | ICD-10-CM

## 2024-02-10 LAB — CUP PACEART REMOTE DEVICE CHECK
Battery Remaining Longevity: 144 mo
Battery Remaining Percentage: 100 %
Brady Statistic RA Percent Paced: 0 %
Brady Statistic RV Percent Paced: 10 %
Date Time Interrogation Session: 20250916023000
Implantable Lead Connection Status: 753985
Implantable Lead Connection Status: 753985
Implantable Lead Implant Date: 20210816
Implantable Lead Implant Date: 20210816
Implantable Lead Location: 753859
Implantable Lead Location: 753860
Implantable Lead Model: 7842
Implantable Lead Model: 7842
Implantable Lead Serial Number: 1013287
Implantable Lead Serial Number: 1091261
Implantable Pulse Generator Implant Date: 20210816
Lead Channel Impedance Value: 460 Ohm
Lead Channel Impedance Value: 728 Ohm
Lead Channel Setting Pacing Amplitude: 2.5 V
Lead Channel Setting Pacing Pulse Width: 0.4 ms
Lead Channel Setting Sensing Sensitivity: 2.5 mV
Pulse Gen Serial Number: 940432
Zone Setting Status: 755011

## 2024-02-12 ENCOUNTER — Ambulatory Visit: Payer: Self-pay | Admitting: Internal Medicine

## 2024-02-15 NOTE — Progress Notes (Signed)
 Remote PPM Transmission

## 2024-02-16 ENCOUNTER — Ambulatory Visit (HOSPITAL_COMMUNITY)
Admission: RE | Admit: 2024-02-16 | Discharge: 2024-02-16 | Disposition: A | Discharge status: Home / Self Care | Source: Ambulatory Visit | Attending: Cardiology | Admitting: Cardiology

## 2024-02-16 ENCOUNTER — Ambulatory Visit (HOSPITAL_BASED_OUTPATIENT_CLINIC_OR_DEPARTMENT_OTHER)
Admission: RE | Admit: 2024-02-16 | Discharge: 2024-02-16 | Disposition: A | Discharge status: Home / Self Care | Source: Ambulatory Visit | Attending: Cardiology | Admitting: Cardiology

## 2024-02-16 DIAGNOSIS — I5022 Chronic systolic (congestive) heart failure: Secondary | ICD-10-CM

## 2024-02-16 DIAGNOSIS — I739 Peripheral vascular disease, unspecified: Secondary | ICD-10-CM

## 2024-02-16 DIAGNOSIS — Z8673 Personal history of transient ischemic attack (TIA), and cerebral infarction without residual deficits: Secondary | ICD-10-CM | POA: Diagnosis not present

## 2024-02-16 DIAGNOSIS — Q231 Congenital insufficiency of aortic valve: Secondary | ICD-10-CM | POA: Diagnosis not present

## 2024-02-16 DIAGNOSIS — Z952 Presence of prosthetic heart valve: Secondary | ICD-10-CM | POA: Diagnosis not present

## 2024-02-16 DIAGNOSIS — I110 Hypertensive heart disease with heart failure: Secondary | ICD-10-CM | POA: Insufficient documentation

## 2024-02-16 DIAGNOSIS — I4891 Unspecified atrial fibrillation: Secondary | ICD-10-CM | POA: Insufficient documentation

## 2024-02-16 DIAGNOSIS — Z950 Presence of cardiac pacemaker: Secondary | ICD-10-CM | POA: Insufficient documentation

## 2024-02-16 DIAGNOSIS — Z87891 Personal history of nicotine dependence: Secondary | ICD-10-CM | POA: Insufficient documentation

## 2024-02-16 DIAGNOSIS — E1151 Type 2 diabetes mellitus with diabetic peripheral angiopathy without gangrene: Secondary | ICD-10-CM | POA: Insufficient documentation

## 2024-02-16 DIAGNOSIS — E785 Hyperlipidemia, unspecified: Secondary | ICD-10-CM | POA: Insufficient documentation

## 2024-02-16 DIAGNOSIS — S91002A Unspecified open wound, left ankle, initial encounter: Secondary | ICD-10-CM | POA: Diagnosis not present

## 2024-02-16 LAB — ECHOCARDIOGRAM COMPLETE
AR max vel: 2.23 cm2
AV Area VTI: 2.28 cm2
AV Area mean vel: 1.83 cm2
AV Mean grad: 6 mmHg
AV Peak grad: 11.3 mmHg
Ao pk vel: 1.68 m/s
Area-P 1/2: 5.13 cm2
Calc EF: 60.7 %
MV M vel: 3.98 m/s
MV Peak grad: 63.4 mmHg
MV VTI: 2.98 cm2
S' Lateral: 3.9 cm
Single Plane A2C EF: 60.9 %
Single Plane A4C EF: 55.6 %

## 2024-02-17 LAB — VAS US ABI WITH/WO TBI
Left ABI: 1.01
Right ABI: 0.95

## 2024-02-18 DIAGNOSIS — I130 Hypertensive heart and chronic kidney disease with heart failure and stage 1 through stage 4 chronic kidney disease, or unspecified chronic kidney disease: Secondary | ICD-10-CM | POA: Diagnosis not present

## 2024-02-18 DIAGNOSIS — Z8673 Personal history of transient ischemic attack (TIA), and cerebral infarction without residual deficits: Secondary | ICD-10-CM | POA: Diagnosis not present

## 2024-02-18 DIAGNOSIS — G894 Chronic pain syndrome: Secondary | ICD-10-CM | POA: Diagnosis not present

## 2024-02-18 DIAGNOSIS — N1832 Chronic kidney disease, stage 3b: Secondary | ICD-10-CM | POA: Diagnosis not present

## 2024-02-18 DIAGNOSIS — E1122 Type 2 diabetes mellitus with diabetic chronic kidney disease: Secondary | ICD-10-CM | POA: Diagnosis not present

## 2024-02-18 DIAGNOSIS — F319 Bipolar disorder, unspecified: Secondary | ICD-10-CM | POA: Diagnosis not present

## 2024-02-18 DIAGNOSIS — I5042 Chronic combined systolic (congestive) and diastolic (congestive) heart failure: Secondary | ICD-10-CM | POA: Diagnosis not present

## 2024-02-18 DIAGNOSIS — L03116 Cellulitis of left lower limb: Secondary | ICD-10-CM | POA: Diagnosis not present

## 2024-02-18 DIAGNOSIS — R269 Unspecified abnormalities of gait and mobility: Secondary | ICD-10-CM | POA: Diagnosis not present

## 2024-02-18 DIAGNOSIS — I482 Chronic atrial fibrillation, unspecified: Secondary | ICD-10-CM | POA: Diagnosis not present

## 2024-02-18 DIAGNOSIS — Z8679 Personal history of other diseases of the circulatory system: Secondary | ICD-10-CM | POA: Diagnosis not present

## 2024-02-18 DIAGNOSIS — Z7901 Long term (current) use of anticoagulants: Secondary | ICD-10-CM | POA: Diagnosis not present

## 2024-02-23 DIAGNOSIS — S91002A Unspecified open wound, left ankle, initial encounter: Secondary | ICD-10-CM | POA: Diagnosis not present

## 2024-02-26 DIAGNOSIS — S91012A Laceration without foreign body, left ankle, initial encounter: Secondary | ICD-10-CM | POA: Diagnosis not present

## 2024-03-01 DIAGNOSIS — S91012A Laceration without foreign body, left ankle, initial encounter: Secondary | ICD-10-CM | POA: Diagnosis not present

## 2024-03-01 DIAGNOSIS — S91002A Unspecified open wound, left ankle, initial encounter: Secondary | ICD-10-CM | POA: Diagnosis not present

## 2024-03-07 DIAGNOSIS — F5104 Psychophysiologic insomnia: Secondary | ICD-10-CM | POA: Diagnosis not present

## 2024-03-07 DIAGNOSIS — I482 Chronic atrial fibrillation, unspecified: Secondary | ICD-10-CM | POA: Diagnosis not present

## 2024-03-07 DIAGNOSIS — I5022 Chronic systolic (congestive) heart failure: Secondary | ICD-10-CM | POA: Diagnosis not present

## 2024-03-07 DIAGNOSIS — F3132 Bipolar disorder, current episode depressed, moderate: Secondary | ICD-10-CM | POA: Diagnosis not present

## 2024-03-07 DIAGNOSIS — Z5181 Encounter for therapeutic drug level monitoring: Secondary | ICD-10-CM | POA: Diagnosis not present

## 2024-03-07 DIAGNOSIS — Z7984 Long term (current) use of oral hypoglycemic drugs: Secondary | ICD-10-CM | POA: Diagnosis not present

## 2024-03-07 DIAGNOSIS — E1122 Type 2 diabetes mellitus with diabetic chronic kidney disease: Secondary | ICD-10-CM | POA: Diagnosis not present

## 2024-03-07 DIAGNOSIS — E559 Vitamin D deficiency, unspecified: Secondary | ICD-10-CM | POA: Diagnosis not present

## 2024-03-07 DIAGNOSIS — E039 Hypothyroidism, unspecified: Secondary | ICD-10-CM | POA: Diagnosis not present

## 2024-03-07 DIAGNOSIS — I130 Hypertensive heart and chronic kidney disease with heart failure and stage 1 through stage 4 chronic kidney disease, or unspecified chronic kidney disease: Secondary | ICD-10-CM | POA: Diagnosis not present

## 2024-03-07 DIAGNOSIS — N1832 Chronic kidney disease, stage 3b: Secondary | ICD-10-CM | POA: Diagnosis not present

## 2024-03-07 DIAGNOSIS — S91002A Unspecified open wound, left ankle, initial encounter: Secondary | ICD-10-CM | POA: Diagnosis not present

## 2024-03-07 DIAGNOSIS — Z7901 Long term (current) use of anticoagulants: Secondary | ICD-10-CM | POA: Diagnosis not present

## 2024-03-08 DIAGNOSIS — S91002A Unspecified open wound, left ankle, initial encounter: Secondary | ICD-10-CM | POA: Diagnosis not present

## 2024-03-11 DIAGNOSIS — I10 Essential (primary) hypertension: Secondary | ICD-10-CM | POA: Diagnosis not present

## 2024-03-11 DIAGNOSIS — E559 Vitamin D deficiency, unspecified: Secondary | ICD-10-CM | POA: Diagnosis not present

## 2024-03-14 DIAGNOSIS — E559 Vitamin D deficiency, unspecified: Secondary | ICD-10-CM | POA: Diagnosis not present

## 2024-03-15 DIAGNOSIS — S91002A Unspecified open wound, left ankle, initial encounter: Secondary | ICD-10-CM | POA: Diagnosis not present

## 2024-03-16 DIAGNOSIS — I5022 Chronic systolic (congestive) heart failure: Secondary | ICD-10-CM | POA: Diagnosis not present

## 2024-03-16 DIAGNOSIS — I130 Hypertensive heart and chronic kidney disease with heart failure and stage 1 through stage 4 chronic kidney disease, or unspecified chronic kidney disease: Secondary | ICD-10-CM | POA: Diagnosis not present

## 2024-03-16 DIAGNOSIS — N1832 Chronic kidney disease, stage 3b: Secondary | ICD-10-CM | POA: Diagnosis not present

## 2024-03-16 DIAGNOSIS — M7031 Other bursitis of elbow, right elbow: Secondary | ICD-10-CM | POA: Diagnosis not present

## 2024-03-22 DIAGNOSIS — S91002A Unspecified open wound, left ankle, initial encounter: Secondary | ICD-10-CM | POA: Diagnosis not present

## 2024-04-14 DIAGNOSIS — Z952 Presence of prosthetic heart valve: Secondary | ICD-10-CM | POA: Diagnosis not present

## 2024-04-14 DIAGNOSIS — E785 Hyperlipidemia, unspecified: Secondary | ICD-10-CM | POA: Diagnosis not present

## 2024-04-14 DIAGNOSIS — Z7984 Long term (current) use of oral hypoglycemic drugs: Secondary | ICD-10-CM | POA: Diagnosis not present

## 2024-04-14 DIAGNOSIS — N1832 Chronic kidney disease, stage 3b: Secondary | ICD-10-CM | POA: Diagnosis not present

## 2024-04-14 DIAGNOSIS — Z7901 Long term (current) use of anticoagulants: Secondary | ICD-10-CM | POA: Diagnosis not present

## 2024-04-14 DIAGNOSIS — Z950 Presence of cardiac pacemaker: Secondary | ICD-10-CM | POA: Diagnosis not present

## 2024-04-14 DIAGNOSIS — E1122 Type 2 diabetes mellitus with diabetic chronic kidney disease: Secondary | ICD-10-CM | POA: Diagnosis not present

## 2024-04-14 DIAGNOSIS — M7031 Other bursitis of elbow, right elbow: Secondary | ICD-10-CM | POA: Diagnosis not present

## 2024-04-14 DIAGNOSIS — E039 Hypothyroidism, unspecified: Secondary | ICD-10-CM | POA: Diagnosis not present

## 2024-04-14 DIAGNOSIS — I482 Chronic atrial fibrillation, unspecified: Secondary | ICD-10-CM | POA: Diagnosis not present

## 2024-04-14 DIAGNOSIS — I130 Hypertensive heart and chronic kidney disease with heart failure and stage 1 through stage 4 chronic kidney disease, or unspecified chronic kidney disease: Secondary | ICD-10-CM | POA: Diagnosis not present

## 2024-04-14 DIAGNOSIS — I5022 Chronic systolic (congestive) heart failure: Secondary | ICD-10-CM | POA: Diagnosis not present

## 2024-04-26 DIAGNOSIS — R0989 Other specified symptoms and signs involving the circulatory and respiratory systems: Secondary | ICD-10-CM | POA: Diagnosis not present

## 2024-05-03 ENCOUNTER — Encounter (HOSPITAL_COMMUNITY)

## 2024-05-10 ENCOUNTER — Ambulatory Visit (INDEPENDENT_AMBULATORY_CARE_PROVIDER_SITE_OTHER): Payer: HMO

## 2024-05-10 DIAGNOSIS — I5022 Chronic systolic (congestive) heart failure: Secondary | ICD-10-CM | POA: Diagnosis not present

## 2024-05-11 LAB — CUP PACEART REMOTE DEVICE CHECK
Battery Remaining Longevity: 138 mo
Battery Remaining Percentage: 100 %
Brady Statistic RA Percent Paced: 0 %
Brady Statistic RV Percent Paced: 8 %
Date Time Interrogation Session: 20251216023100
Implantable Lead Connection Status: 753985
Implantable Lead Connection Status: 753985
Implantable Lead Implant Date: 20210816
Implantable Lead Implant Date: 20210816
Implantable Lead Location: 753859
Implantable Lead Location: 753860
Implantable Lead Model: 7842
Implantable Lead Model: 7842
Implantable Lead Serial Number: 1013287
Implantable Lead Serial Number: 1091261
Implantable Pulse Generator Implant Date: 20210816
Lead Channel Impedance Value: 492 Ohm
Lead Channel Impedance Value: 731 Ohm
Lead Channel Setting Pacing Amplitude: 2.5 V
Lead Channel Setting Pacing Pulse Width: 0.4 ms
Lead Channel Setting Sensing Sensitivity: 2.5 mV
Pulse Gen Serial Number: 940432
Zone Setting Status: 755011

## 2024-05-11 NOTE — Progress Notes (Signed)
 Remote PPM Transmission

## 2024-05-22 ENCOUNTER — Ambulatory Visit: Payer: Self-pay | Admitting: Internal Medicine

## 2024-05-24 ENCOUNTER — Telehealth (HOSPITAL_COMMUNITY): Payer: Self-pay

## 2024-05-24 NOTE — Progress Notes (Incomplete)
 "  ADVANCED HF CLINIC NOTE   PCP: Dr Domenica  Primary Cardiologist: Feliciano Devoria LABOR, MD  HF Cardiology: Dr. Rolan  Chief complaint: CHF  HPI: [name] is a 73 y.o. female with a hx of bicuspid AV/AS with ascending aortic aneurysm s/p resection/grafting of ascending aortic aneurysm and placement of St. Jude mechanical AV conduit/Bentall 2009, persistent atrial fib, bradycardia s/p [employer]  PPM implantation 12/2019, CVAs, multiple falls with prior craniotomy for subdural hematoma, prior chronic CVAs on MRI 2021, bipolar disorder, anemia, depression, debilitation (wheelchair bound), DM, HLD, HTN, mild carotid artery disease 2021, back pain, obesity, memory impairment, and chronic HFrEF.   Admitted 06/2021 with increased shortness of breath in the setting of acute HFrEF. Diuresed with IV lasix . Echo repeated EF 25-30%. Had cath with patent coronaries. Started on ARNi and SGLT2i. Discharged to [facility].   She appears to have gone back into atrial fibrillation some time between 5/22 and 12/22 after amiodarone  was stopped. She has had 3 bad falls (12/21, 2/22, 4/22) resulting in subdural hematomas.  She is, however, still on warfarin.    Follow up 4/23, NYHA II-III, Entresto  increased. Remained in AF and arranged for DCCV. S/p TEE/DCCV 09/23/21 to NSR.  Patient was admitted in 8/24 with AKI, meds were cut back.   Echo in 9/24 showed EF 55-60%, mild asymmetric septal hypertrophy, mild RV dysfunction, mechanical aortic valve mean gradient 9 mmHg, IVC normal.   Today she returns for AHF follow up with her daughter. Weight stable.  Mild dyspnea with showering, does ok getting dressed. Uses wheelchair or walker due to unsteadiness/poor balance.  She does not walk far, denies dyspnea with short distances with walker.  She lives at a SNF.  She has a left ankle ulcer being treated by wound care.  She is now in permanent atrial fibrillation, amiodarone  stopped by EP.   ECG (personally  reviewed): Atrial fibrillation with PVC  REDS clip 25%  Labs (6/25): BNP 220, K 4.1, creatinine 1.36  PMH: 1. Bicuspid aortic valve with ascending aortic aneurysm: S/p St Jude mechanical aortic valve with Bentall procedure in 2009 2. Atrial fibrillation: DCCV to NSR in 12/21.   - s/p TEE/DCCV 5/23 to NSR - Now permanent, off amiodarone .  3. Bradycardia: [employer] PPM with left bundle lead in 3/21.  4. CVAs: Now wheelchair-bound.  5. Multiple falls: 12/21 with subdural hematoma requiring craniotomy, 2/22 with subdural, 4/22 with subdural.  6. Bipolar disorder 7. Dementia 8. Hypothyroidism 9. CKD stage 3 10. Chronic systolic CHF: Nonischemic cardiomyopathy.  - Echo (11/22): EF 40-45%.  - LHC/RHC (2/23): No CAD; mean RA 12, PA 45/22, PCWP mean 25, CI 3.5 - Echo (2/23): EF 25-30%, global HK, RV normal, mild MR, St Jude mechanical AVR with normal function.  - TEE (5/23): EF 35-40%, RV mildly reduced, mild to moderate MR, St Jude mechanical AVR with normal function. - Echo (9/24): EF 55-60%, mild asymmetric septal hypertrophy, mild RV dysfunction, mechanical aortic valve mean gradient 9 mmHg, IVC normal.  11. Hyperlipidemia 12. CKD stage 3 13. L-spine athritis   Review of Systems: All systems reviewed and negative except as per HPI.   Current Outpatient Medications  Medication Sig Dispense Refill   acetaminophen  (TYLENOL ) 500 MG tablet Take 1,000 mg by mouth in the morning, at noon, and at bedtime.     BELSOMRA 20 MG TABS Take 1 tablet by mouth at bedtime.     benzonatate  (TESSALON ) 100 MG capsule Take 100 mg  by mouth 3 (three) times daily.     carvedilol  (COREG ) 6.25 MG tablet Take 6.25 mg by mouth 2 (two) times daily.     dapagliflozin  propanediol (FARXIGA ) 10 MG TABS tablet Take 1 tablet (10 mg total) by mouth daily. 30 tablet 0   DULoxetine (CYMBALTA) 60 MG capsule Take 60 mg by mouth daily.     estradiol  (ESTRACE ) 0.1 MG/GM vaginal cream Place 1 Applicatorful vaginally  every Monday, Wednesday, and Friday.     fenofibrate  160 MG tablet Take 1 tablet (160 mg total) by mouth daily.     fexofenadine (ALLEGRA) 60 MG tablet Take 60 mg by mouth daily.     furosemide  (LASIX ) 20 MG tablet Take 1 tablet (20 mg total) by mouth daily.     hydrocortisone  cream 1 % Apply 1 Application topically 2 (two) times daily. Apply to abdominal area and to top of breasts for itching// not in skin folds     lamoTRIgine  (LAMICTAL ) 100 MG tablet Take 100 mg by mouth daily.     lamoTRIgine  (LAMICTAL ) 200 MG tablet Take 200 mg by mouth at bedtime.     levalbuterol (XOPENEX) 0.63 MG/3ML nebulizer solution Take 0.63 mg by nebulization daily as needed for wheezing or shortness of breath.     levothyroxine  (SYNTHROID ) 50 MCG tablet Take 50 mcg by mouth daily before breakfast.     meclizine (ANTIVERT) 25 MG tablet Take 25 mg by mouth daily as needed for dizziness (vertigo). (Patient not taking: Reported on 02/02/2024)     methocarbamol (ROBAXIN) 500 MG tablet Take 500 mg by mouth 2 (two) times daily as needed for muscle spasms.     pantoprazole  (PROTONIX ) 20 MG tablet Take 20 mg by mouth every other day.     polyvinyl alcohol  (LIQUIFILM TEARS) 1.4 % ophthalmic solution Place 1 drop into both eyes in the morning, at noon, and at bedtime.     potassium chloride  SA (KLOR-CON  M) 20 MEQ tablet Take 1 tablet (20 mEq total) by mouth daily.     Propylene Glycol 0.6 % SOLN Apply 1 drop to eye at bedtime.     rosuvastatin  (CRESTOR ) 40 MG tablet Take 40 mg by mouth at bedtime.     sacubitril -valsartan  (ENTRESTO ) 24-26 MG Take 1 tablet by mouth 2 (two) times daily.     spironolactone  (ALDACTONE ) 25 MG tablet Take 1 tablet (25 mg total) by mouth daily. 30 tablet 0   torsemide (DEMADEX) 20 MG tablet Take 40 mg by mouth daily.     traZODone  (DESYREL ) 100 MG tablet Take 100 mg by mouth daily. TDD: 150 mg     traZODone  (DESYREL ) 50 MG tablet Take 50 mg by mouth at bedtime. TDD: 150 mg     vitamin B-12  (CYANOCOBALAMIN ) 1000 MCG tablet Take 1,000 mcg by mouth every Monday.     warfarin (COUMADIN ) 2 MG tablet Take 2 mg by mouth daily at 12 noon.     No current facility-administered medications for this visit.   Allergies  Allergen Reactions   Mucinex [Guaifenesin Er] Other (See Comments)    Severe headaches    Keflex  [Cephalexin ] Other (See Comments)    Headaches Dizziness    Mucinex [Guaifenesin Er] Other (See Comments)    Severe headaches   Sulfa Antibiotics Other (See Comments)    Headaches    Sulfonamide Derivatives Other (See Comments)    Headaches    Social History   Socioeconomic History   Marital status: Widowed    Spouse  name: Not on file   Number of children: Not on file   Years of education: Not on file   Highest education level: Not on file  Occupational History   Not on file  Tobacco Use   Smoking status: Former    Current packs/day: 0.00    Types: Cigarettes    Quit date: 05/26/1990    Years since quitting: 34.0   Smokeless tobacco: Never  Vaping Use   Vaping status: Never Used  Substance and Sexual Activity   Alcohol  use: Yes    Alcohol /week: 0.0 standard drinks of alcohol     Comment: rare   Drug use: No   Sexual activity: Not on file  Other Topics Concern   Not on file  Social History Narrative   ** Merged History Encounter **       Social Drivers of Health   Tobacco Use: Low Risk (02/02/2024)   Received from [facility]   Patient History    Smoking Tobacco Use: Never    Smokeless Tobacco Use: Never    Passive Exposure: Not on file  Financial Resource Strain: Not on file  Food Insecurity: No Food Insecurity (01/09/2023)   Hunger Vital Sign    Worried About Running Out of Food in the Last Year: Never true    Ran Out of Food in the Last Year: Never true  Transportation Needs: No Transportation Needs (01/09/2023)   PRAPARE - Administrator, Civil Service (Medical): No    Lack of Transportation (Non-Medical): No  Physical  Activity: Not on file  Stress: Not on file  Social Connections: Not on file  Intimate Partner Violence: Not At Risk (01/09/2023)   Humiliation, Afraid, Rape, and Kick questionnaire    Fear of Current or Ex-Partner: No    Emotionally Abused: No    Physically Abused: No    Sexually Abused: No  Depression (PHQ2-9): Not on file  Alcohol  Screen: Not on file  Housing: Low Risk (01/09/2023)   Housing    Last Housing Risk Score: 0  Utilities: Not At Risk (01/09/2023)   AHC Utilities    Threatened with loss of utilities: No  Health Literacy: Not on file   Family History  Problem Relation Age of Onset   Scoliosis Mother    Arthritis Mother        Rheumatoid   Aneurysm Mother        heart   Alcohol  abuse Father    Depression Sister    Depression Brother    Alcohol  abuse Brother    Cancer Maternal Grandmother        colon   Diabetes Maternal Grandfather    Heart disease Maternal Grandfather    Alcohol  abuse Maternal Grandfather    Depression Paternal Grandmother    Diabetes Paternal Grandmother    Depression Paternal Grandfather    Diabetes Paternal Grandfather    Depression Sister    Alcohol  abuse Brother    Depression Brother    Heart disease Brother    There were no vitals taken for this visit.  Wt Readings from Last 3 Encounters:  02/02/24 109.6 kg (241 lb 9.6 oz)  10/29/23 109.2 kg (240 lb 12.8 oz)  10/22/23 108.4 kg (239 lb)  General: NAD Neck: JVP 8 cm, no thyromegaly or thyroid  nodule.  Lungs: Clear to auscultation bilaterally with normal respiratory effort. CV: Nondisplaced PMI.  Heart regular S1/S2 with mechanical S2, no S3/S4, 2/6 SEM RUSB.  1+ ankle edema.  No carotid bruit.  Normal pedal pulses.  Abdomen: Soft, nontender, no hepatosplenomegaly, no distention.  Skin: Intact without lesions or rashes.  Neurologic: Alert and oriented x 3.  Psych: Normal affect. Extremities: No clubbing or cyanosis. Left ankle ulceration.  HEENT: Normal.   ASSESSMENT & PLAN: 1.  Chronic systolic CHF: Nonischemic cardiomyopathy.  Cath 06/2020 without significant coronary disease.  06/2020 echo with EF 25-30%, previous echo 03/2021 with EF 40-45%. She has a [employer] PPM but rarely paces her RV.  TEE (5/23) EF 35-40%. Echo in 9/24 showed improved EF 55-60%, mild asymmetric septal hypertrophy, mild RV dysfunction, mechanical aortic valve mean gradient 9 mmHg, IVC normal. NYHA class II, limited more by deconditioning and orthopedic problems.  Minimally volume overloaded on exam, REDS clip not elevated.  - Continue torsemide 40 mg daily, BMET/BNP today.   - Continue carvedilol  6.25 mg bid. - Continue Entresto  24/26 mg bid.  - Increase spironolactone  to 25 mg daily.  BMET in 10 days.  - Continue Farxiga  10 mg daily.  - I will arrange for repeat echo this year.  2. CKD Stage III: BMET today, gets labs regularly at SNF.  3. Atrial fibrillation:  Now permanent. S/p TEE/DCCV to NSR 5/23. She was on amiodarone  but this was stopped with breakthrough AF.  She is now off amiodarone .  - Continue warfarin.  4. Mechanical Aortic Valve: On warfarin, goal INR 2.5-3.5 with h/o atrial fibrillation.  Stable valve on echo in 9/24. 5. Tachy/Brady Syndrome: [employer] PPM, she has a left bundle lead, rarely paces.   6. Falls: Frequent in past with SDH.  Now in SNF, seems to be doing better with no recent falls but minimally mobile.  She has remained on warfarin given mechanical AoV.  7. Poorly healing left ankle ulcer: Followed by wound care.  - I will arrange for peripheral arterial doppler evaluation.   Follow up in 3 months APP  I spent 31 minutes reviewing data and LVAD parameters, interviewing patient, and organizing the orders/followup.   [name] [facility]  05/24/2024 "

## 2024-05-24 NOTE — Telephone Encounter (Signed)
 Called and spoke to [name] Amy to confirm/remind patient of [facility].   Appointment:   [x] Confirmed  [] Left mess   [] No answer/No voice mail  [] VM Full/unable to leave message  [] Phone not in service  Patient reminded to bring all medications and/or complete list.  Confirmed patient has transportation. Gave directions, instructed to utilize valet parking.

## 2024-05-25 ENCOUNTER — Inpatient Hospital Stay (HOSPITAL_COMMUNITY)
Admission: EM | Admit: 2024-05-25 | Discharge: 2024-05-27 | DRG: 683 | Disposition: A | Discharge status: Skilled Nursing Facility | Source: Skilled Nursing Facility | Attending: Internal Medicine | Admitting: Internal Medicine

## 2024-05-25 ENCOUNTER — Ambulatory Visit (HOSPITAL_COMMUNITY): Admission: RE | Admit: 2024-05-25 | Discharge: 2024-05-25 | Disposition: A | Source: Ambulatory Visit

## 2024-05-25 ENCOUNTER — Other Ambulatory Visit: Payer: Self-pay

## 2024-05-25 ENCOUNTER — Encounter (HOSPITAL_COMMUNITY): Payer: Self-pay

## 2024-05-25 DIAGNOSIS — N1832 Chronic kidney disease, stage 3b: Secondary | ICD-10-CM | POA: Diagnosis present

## 2024-05-25 DIAGNOSIS — R5381 Other malaise: Secondary | ICD-10-CM | POA: Diagnosis present

## 2024-05-25 DIAGNOSIS — F319 Bipolar disorder, unspecified: Secondary | ICD-10-CM | POA: Diagnosis present

## 2024-05-25 DIAGNOSIS — Z90710 Acquired absence of both cervix and uterus: Secondary | ICD-10-CM | POA: Diagnosis not present

## 2024-05-25 DIAGNOSIS — Z882 Allergy status to sulfonamides status: Secondary | ICD-10-CM

## 2024-05-25 DIAGNOSIS — Z79899 Other long term (current) drug therapy: Secondary | ICD-10-CM

## 2024-05-25 DIAGNOSIS — Z8261 Family history of arthritis: Secondary | ICD-10-CM

## 2024-05-25 DIAGNOSIS — F32A Depression, unspecified: Secondary | ICD-10-CM | POA: Diagnosis present

## 2024-05-25 DIAGNOSIS — Z87891 Personal history of nicotine dependence: Secondary | ICD-10-CM

## 2024-05-25 DIAGNOSIS — I4891 Unspecified atrial fibrillation: Secondary | ICD-10-CM | POA: Diagnosis present

## 2024-05-25 DIAGNOSIS — E1169 Type 2 diabetes mellitus with other specified complication: Secondary | ICD-10-CM | POA: Diagnosis present

## 2024-05-25 DIAGNOSIS — E039 Hypothyroidism, unspecified: Secondary | ICD-10-CM | POA: Diagnosis present

## 2024-05-25 DIAGNOSIS — E1142 Type 2 diabetes mellitus with diabetic polyneuropathy: Secondary | ICD-10-CM | POA: Diagnosis present

## 2024-05-25 DIAGNOSIS — E7849 Other hyperlipidemia: Secondary | ICD-10-CM | POA: Diagnosis present

## 2024-05-25 DIAGNOSIS — Z811 Family history of alcohol abuse and dependence: Secondary | ICD-10-CM

## 2024-05-25 DIAGNOSIS — I739 Peripheral vascular disease, unspecified: Secondary | ICD-10-CM | POA: Diagnosis present

## 2024-05-25 DIAGNOSIS — Z96651 Presence of right artificial knee joint: Secondary | ICD-10-CM | POA: Diagnosis present

## 2024-05-25 DIAGNOSIS — Z833 Family history of diabetes mellitus: Secondary | ICD-10-CM

## 2024-05-25 DIAGNOSIS — Z8249 Family history of ischemic heart disease and other diseases of the circulatory system: Secondary | ICD-10-CM | POA: Diagnosis not present

## 2024-05-25 DIAGNOSIS — E1122 Type 2 diabetes mellitus with diabetic chronic kidney disease: Secondary | ICD-10-CM | POA: Diagnosis present

## 2024-05-25 DIAGNOSIS — N179 Acute kidney failure, unspecified: Secondary | ICD-10-CM | POA: Diagnosis present

## 2024-05-25 DIAGNOSIS — Z8673 Personal history of transient ischemic attack (TIA), and cerebral infarction without residual deficits: Secondary | ICD-10-CM

## 2024-05-25 DIAGNOSIS — I130 Hypertensive heart and chronic kidney disease with heart failure and stage 1 through stage 4 chronic kidney disease, or unspecified chronic kidney disease: Secondary | ICD-10-CM | POA: Diagnosis present

## 2024-05-25 DIAGNOSIS — E669 Obesity, unspecified: Secondary | ICD-10-CM | POA: Diagnosis present

## 2024-05-25 DIAGNOSIS — E1151 Type 2 diabetes mellitus with diabetic peripheral angiopathy without gangrene: Secondary | ICD-10-CM | POA: Diagnosis present

## 2024-05-25 DIAGNOSIS — I5022 Chronic systolic (congestive) heart failure: Secondary | ICD-10-CM | POA: Diagnosis present

## 2024-05-25 DIAGNOSIS — Z7901 Long term (current) use of anticoagulants: Secondary | ICD-10-CM

## 2024-05-25 DIAGNOSIS — I7121 Aneurysm of the ascending aorta, without rupture: Secondary | ICD-10-CM | POA: Diagnosis present

## 2024-05-25 DIAGNOSIS — Z881 Allergy status to other antibiotic agents status: Secondary | ICD-10-CM

## 2024-05-25 DIAGNOSIS — Z79890 Hormone replacement therapy (postmenopausal): Secondary | ICD-10-CM

## 2024-05-25 DIAGNOSIS — Z952 Presence of prosthetic heart valve: Secondary | ICD-10-CM | POA: Diagnosis not present

## 2024-05-25 DIAGNOSIS — I959 Hypotension, unspecified: Secondary | ICD-10-CM | POA: Diagnosis present

## 2024-05-25 DIAGNOSIS — F411 Generalized anxiety disorder: Secondary | ICD-10-CM | POA: Diagnosis present

## 2024-05-25 DIAGNOSIS — I4821 Permanent atrial fibrillation: Secondary | ICD-10-CM | POA: Diagnosis present

## 2024-05-25 DIAGNOSIS — Z888 Allergy status to other drugs, medicaments and biological substances status: Secondary | ICD-10-CM

## 2024-05-25 DIAGNOSIS — I10 Essential (primary) hypertension: Secondary | ICD-10-CM | POA: Diagnosis present

## 2024-05-25 DIAGNOSIS — Z818 Family history of other mental and behavioral disorders: Secondary | ICD-10-CM

## 2024-05-25 DIAGNOSIS — Z66 Do not resuscitate: Secondary | ICD-10-CM | POA: Diagnosis present

## 2024-05-25 DIAGNOSIS — Z85828 Personal history of other malignant neoplasm of skin: Secondary | ICD-10-CM | POA: Diagnosis not present

## 2024-05-25 LAB — COMPREHENSIVE METABOLIC PANEL WITH GFR
ALT: 15 U/L (ref 0–44)
AST: 45 U/L — ABNORMAL HIGH (ref 15–41)
Albumin: 4.1 g/dL (ref 3.5–5.0)
Alkaline Phosphatase: 32 U/L — ABNORMAL LOW (ref 38–126)
Anion gap: 13 (ref 5–15)
BUN: 126 mg/dL — ABNORMAL HIGH (ref 8–23)
CO2: 30 mmol/L (ref 22–32)
Calcium: 9.8 mg/dL (ref 8.9–10.3)
Chloride: 92 mmol/L — ABNORMAL LOW (ref 98–111)
Creatinine, Ser: 2.81 mg/dL — ABNORMAL HIGH (ref 0.44–1.00)
GFR, Estimated: 17 mL/min — ABNORMAL LOW
Glucose, Bld: 137 mg/dL — ABNORMAL HIGH (ref 70–99)
Potassium: 3.5 mmol/L (ref 3.5–5.1)
Sodium: 136 mmol/L (ref 135–145)
Total Bilirubin: 0.6 mg/dL (ref 0.0–1.2)
Total Protein: 7.2 g/dL (ref 6.5–8.1)

## 2024-05-25 LAB — I-STAT CHEM 8, ED
BUN: >130 mg/dL — ABNORMAL HIGH (ref 8–23)
Calcium, Ion: 1.1 mmol/L — ABNORMAL LOW (ref 1.15–1.40)
Chloride: 93 mmol/L — ABNORMAL LOW (ref 98–111)
Creatinine, Ser: 3 mg/dL — ABNORMAL HIGH (ref 0.44–1.00)
Glucose, Bld: 134 mg/dL — ABNORMAL HIGH (ref 70–99)
HCT: 37 % (ref 36.0–46.0)
Hemoglobin: 12.6 g/dL (ref 12.0–15.0)
Potassium: 3.4 mmol/L — ABNORMAL LOW (ref 3.5–5.1)
Sodium: 135 mmol/L (ref 135–145)
TCO2: 31 mmol/L (ref 22–32)

## 2024-05-25 LAB — URINALYSIS, ROUTINE W REFLEX MICROSCOPIC
Bilirubin Urine: NEGATIVE
Glucose, UA: 50 mg/dL — AB
Hgb urine dipstick: NEGATIVE
Ketones, ur: NEGATIVE mg/dL
Leukocytes,Ua: NEGATIVE
Nitrite: NEGATIVE
Protein, ur: NEGATIVE mg/dL
Specific Gravity, Urine: 1.012 (ref 1.005–1.030)
pH: 6 (ref 5.0–8.0)

## 2024-05-25 LAB — CBC
HCT: 39 % (ref 36.0–46.0)
Hemoglobin: 12.9 g/dL (ref 12.0–15.0)
MCH: 31.7 pg (ref 26.0–34.0)
MCHC: 33.1 g/dL (ref 30.0–36.0)
MCV: 95.8 fL (ref 80.0–100.0)
Platelets: 216 K/uL (ref 150–400)
RBC: 4.07 MIL/uL (ref 3.87–5.11)
RDW: 13.1 % (ref 11.5–15.5)
WBC: 6.8 K/uL (ref 4.0–10.5)
nRBC: 0 % (ref 0.0–0.2)

## 2024-05-25 LAB — CBG MONITORING, ED: Glucose-Capillary: 145 mg/dL — ABNORMAL HIGH (ref 70–99)

## 2024-05-25 LAB — TSH: TSH: 2.69 u[IU]/mL (ref 0.350–4.500)

## 2024-05-25 LAB — PROTIME-INR
INR: 2.9 — ABNORMAL HIGH (ref 0.8–1.2)
Prothrombin Time: 31.8 s — ABNORMAL HIGH (ref 11.4–15.2)

## 2024-05-25 MED ORDER — SODIUM CHLORIDE 0.9 % IV BOLUS
500.0000 mL | Freq: Once | INTRAVENOUS | Status: AC
  Administered 2024-05-25: 500 mL via INTRAVENOUS

## 2024-05-25 MED ORDER — SODIUM CHLORIDE 0.9 % IV SOLN: INTRAVENOUS | Status: AC

## 2024-05-25 MED ORDER — SODIUM CHLORIDE 0.9 % IV BOLUS
1000.0000 mL | Freq: Once | INTRAVENOUS | Status: AC
  Administered 2024-05-25: 1000 mL via INTRAVENOUS

## 2024-05-25 MED ORDER — TRAZODONE HCL 50 MG PO TABS
50.0000 mg | ORAL_TABLET | Freq: Every day | ORAL | Status: DC
  Administered 2024-05-26: 50 mg via ORAL
  Filled 2024-05-25: qty 1

## 2024-05-25 MED ORDER — ACETAMINOPHEN 650 MG RE SUPP: 650.0000 mg | Freq: Four times a day (QID) | RECTAL | Status: DC | PRN

## 2024-05-25 MED ORDER — LAMOTRIGINE 100 MG PO TABS
200.0000 mg | ORAL_TABLET | Freq: Every day | ORAL | Status: DC
  Administered 2024-05-26: 200 mg via ORAL
  Filled 2024-05-25: qty 2

## 2024-05-25 MED ORDER — WARFARIN - PHARMACIST DOSING INPATIENT: Freq: Every day | Status: DC

## 2024-05-25 MED ORDER — WARFARIN SODIUM 2 MG PO TABS
2.0000 mg | ORAL_TABLET | Freq: Every day | ORAL | Status: DC
  Administered 2024-05-25: 2 mg via ORAL
  Filled 2024-05-25: qty 1

## 2024-05-25 MED ORDER — LAMOTRIGINE 100 MG PO TABS
100.0000 mg | ORAL_TABLET | Freq: Every day | ORAL | Status: DC
  Administered 2024-05-25 – 2024-05-27 (×3): 100 mg via ORAL
  Filled 2024-05-25 (×3): qty 1

## 2024-05-25 MED ORDER — ACETAMINOPHEN 325 MG PO TABS: 650.0000 mg | ORAL_TABLET | Freq: Four times a day (QID) | ORAL | Status: DC | PRN

## 2024-05-25 MED ORDER — SODIUM CHLORIDE 0.9% FLUSH
3.0000 mL | Freq: Two times a day (BID) | INTRAVENOUS | Status: DC
  Administered 2024-05-25 – 2024-05-27 (×3): 3 mL via INTRAVENOUS

## 2024-05-25 MED ORDER — LEVOTHYROXINE SODIUM 75 MCG PO TABS
75.0000 ug | ORAL_TABLET | Freq: Every day | ORAL | Status: DC
  Administered 2024-05-26 – 2024-05-27 (×2): 75 ug via ORAL
  Filled 2024-05-25 (×2): qty 1

## 2024-05-25 MED ORDER — TRAZODONE HCL 100 MG PO TABS
100.0000 mg | ORAL_TABLET | Freq: Every day | ORAL | Status: DC
  Administered 2024-05-25 – 2024-05-26 (×2): 100 mg via ORAL
  Filled 2024-05-25: qty 2
  Filled 2024-05-25: qty 1

## 2024-05-25 NOTE — ED Notes (Signed)
 This nurse stuck patient 2 times, no success in gaining access and blood clotted in lab tube. IV team ordered for difficult access. PA notified.

## 2024-05-25 NOTE — Assessment & Plan Note (Signed)
-   On statin and Coumadin

## 2024-05-25 NOTE — Assessment & Plan Note (Addendum)
-   patient has history of CKD3b. Baseline creat ~ 1.5, eGFR~ low 30s - patient presents with increase in creat >0.3 mg/dL above baseline or creat increase >1.5x baseline presumed to have occurred within past 7 days PTA - creat 2.81 on admission with BUN 126 - She is not encephalopathic but does have asterixis on exam - Responded well to fluids since admission - strongly suspect she was overdiuresed as she declines much decreased oral intake - Creatinine improved to 1.27 at time of discharge.  BUN down to 43 also -Diuretics adjusted at discharge,

## 2024-05-25 NOTE — Assessment & Plan Note (Signed)
"   Continue Lamictal          "

## 2024-05-25 NOTE — Assessment & Plan Note (Signed)
-   Hold fenofibrate  and Crestor  for now

## 2024-05-25 NOTE — Assessment & Plan Note (Signed)
-   No home meds noted - Well-controlled historically - Update A1c -Diet control for now otherwise

## 2024-05-25 NOTE — ED Triage Notes (Signed)
 Patient from [facility] with increased AKI, dizziness balance issues weightloss and vision issues.  As well as nausea +diarrhea.  98/60 76HR CBG 141

## 2024-05-25 NOTE — Assessment & Plan Note (Signed)
 All meds on hold

## 2024-05-25 NOTE — Assessment & Plan Note (Signed)
-   now in permanent afib; taken off amio per EP

## 2024-05-25 NOTE — Assessment & Plan Note (Signed)
-   Resume Crestor  and fenofibrate

## 2024-05-25 NOTE — ED Provider Notes (Signed)
 " [place] [facility] AT [facility] Provider Note   CSN: [id number] Arrival date & time: 05/25/24  9077     Patient presents with: Dehydration and Abnormal Lab   [name] is a 73 y.o. female.   The history is provided by the patient, the EMS personnel and medical records. No language interpreter was used.  Abnormal Lab    73 year old female history of bipolar, aortic valve replacement, thyroid  disease, atrial flutter currently on Coumadin , hypertension, Graves' disease, CHF with EF 55-60% brought here via EMS from [facility] with concerns of generalized fatigue.  Patient states for the past week she has had progressive worsening fatigue, feeling dehydrated, having intermittent episodes of diarrhea, endorsed nausea without vomiting, feeling dizzy and blurry vision.  She did mention that she was having bilateral leg swelling and retaining fluid but her symptoms is improving with Lasix   Prior to Admission medications  Medication Sig Start Date End Date Taking? Authorizing Provider  acetaminophen  (TYLENOL ) 500 MG tablet Take 1,000 mg by mouth in the morning, at noon, and at bedtime.    [provider]  BELSOMRA 20 MG TABS Take 1 tablet by mouth at bedtime. 08/02/23   [provider]  benzonatate  (TESSALON ) 100 MG capsule Take 100 mg by mouth 3 (three) times daily. 09/22/23   [provider]  carvedilol  (COREG ) 6.25 MG tablet Take 6.25 mg by mouth 2 (two) times daily.    [provider]  dapagliflozin  propanediol (FARXIGA ) 10 MG TABS tablet Take 1 tablet (10 mg total) by mouth daily. 07/08/21   Sheikh, Omair Latif, DO  DULoxetine (CYMBALTA) 60 MG capsule Take 60 mg by mouth daily. 08/03/23   [provider]  estradiol  (ESTRACE ) 0.1 MG/GM vaginal cream Place 1 Applicatorful vaginally every Monday, Wednesday, and Friday.    [provider]  fenofibrate  160 MG tablet Take 1 tablet (160 mg total) by mouth daily. 07/25/20    [place], Eric J, DO  fexofenadine (ALLEGRA) 60 MG tablet Take 60 mg by mouth daily.    [provider]  furosemide  (LASIX ) 20 MG tablet Take 1 tablet (20 mg total) by mouth daily. 10/29/23   Hayes Beckey CROME, NP  hydrocortisone  cream 1 % Apply 1 Application topically 2 (two) times daily. Apply to abdominal area and to top of breasts for itching// not in skin folds    [provider]  lamoTRIgine  (LAMICTAL ) 100 MG tablet Take 100 mg by mouth daily.    [provider]  lamoTRIgine  (LAMICTAL ) 200 MG tablet Take 200 mg by mouth at bedtime. 12/13/20   [provider]  levalbuterol (XOPENEX) 0.63 MG/3ML nebulizer solution Take 0.63 mg by nebulization daily as needed for wheezing or shortness of breath.    [provider]  levothyroxine  (SYNTHROID ) 50 MCG tablet Take 50 mcg by mouth daily before breakfast.    [provider]  meclizine (ANTIVERT) 25 MG tablet Take 25 mg by mouth daily as needed for dizziness (vertigo). Patient not taking: Reported on 02/02/2024    [provider]  methocarbamol (ROBAXIN) 500 MG tablet Take 500 mg by mouth 2 (two) times daily as needed for muscle spasms.    [provider]  pantoprazole  (PROTONIX ) 20 MG tablet Take 20 mg by mouth every other day.    [provider]  polyvinyl alcohol  (LIQUIFILM TEARS) 1.4 % ophthalmic solution Place 1 drop into both eyes in the morning, at noon, and at bedtime.    [provider]  potassium chloride  SA (KLOR-CON  M) 20 MEQ tablet Take 1 tablet (20 mEq total) by mouth daily. 07/08/21   Sheikh, Omair Latif, DO  Propylene Glycol 0.6 % SOLN Apply 1 drop to eye at bedtime.    [provider]  rosuvastatin  (CRESTOR ) 40 MG tablet Take 40 mg by mouth at bedtime.    [provider]  sacubitril -valsartan  (ENTRESTO ) 24-26 MG Take 1 tablet by mouth 2 (two) times daily.    [provider]  spironolactone  (ALDACTONE ) 25 MG tablet Take 1 tablet (25  mg total) by mouth daily. 02/02/24 03/03/24  Rolan Ezra RAMAN, MD  torsemide  (DEMADEX ) 20 MG tablet Take 40 mg by mouth daily.    [provider]  traZODone  (DESYREL ) 100 MG tablet Take 100 mg by mouth daily. TDD: 150 mg 07/27/23   [provider]  traZODone  (DESYREL ) 50 MG tablet Take 50 mg by mouth at bedtime. TDD: 150 mg 07/27/23   [provider]  vitamin B-12 (CYANOCOBALAMIN ) 1000 MCG tablet Take 1,000 mcg by mouth every Monday.    [provider]  warfarin (COUMADIN ) 2 MG tablet Take 2 mg by mouth daily at 12 noon. 10/01/20   [provider]  methimazole  (TAPAZOLE ) 5 MG tablet Take 1 tablet (5 mg total) by mouth 3 (three) times daily. 06/24/11 08/31/19  Kassie Mallick, MD    Allergies: Mucinex [guaifenesin er], Keflex  Emma.eriksson ], Mucinex [guaifenesin er], Sulfa antibiotics, and Sulfonamide derivatives    Review of Systems  All other systems reviewed and are negative.   Updated Vital Signs BP (!) 98/56 (BP Location: Left Arm)   Pulse 76   Temp 97.9 F (36.6 C) (Oral)   Resp 17   Ht 5' 8 (1.727 m)   Wt 109.6 kg   SpO2 97%   BMI 36.74 kg/m   Physical Exam Vitals and nursing note reviewed.  Constitutional:      General: She is not in acute distress.    Appearance: She is well-developed. She is obese.  HENT:     Head: Atraumatic.  Eyes:     Conjunctiva/sclera: Conjunctivae normal.  Neck:     Comments: No JVD Cardiovascular:     Rate and Rhythm: Normal rate. Rhythm irregular.     Heart sounds: Murmur heard.  Pulmonary:     Effort: Pulmonary effort is normal.  Abdominal:     Palpations: Abdomen is soft.     Tenderness: There is no abdominal tenderness.  Musculoskeletal:        General: Normal range of motion.     Cervical back: Normal range of motion and neck supple.     Right lower leg: No edema.     Left lower leg: No edema.  Skin:    Findings: No rash.  Neurological:     Mental Status: She is alert and oriented to person,  place, and time.  Psychiatric:        Mood and Affect: Mood normal.     (all labs ordered are listed, but only abnormal results are displayed) Labs Reviewed  URINALYSIS, ROUTINE W REFLEX MICROSCOPIC - Abnormal; Notable for the following components:      Result Value   APPearance HAZY (*)    Glucose, UA 50 (*)    All other components within normal limits  PROTIME-INR - Abnormal; Notable for the following components:   Prothrombin Time 31.8 (*)    INR 2.9 (*)    All other components within normal limits  CBG MONITORING,  ED - Abnormal; Notable for the following components:   Glucose-Capillary 145 (*)    All other components within normal limits  I-STAT CHEM 8, ED - Abnormal; Notable for the following components:   Potassium 3.4 (*)    Chloride 93 (*)    BUN >130 (*)    Creatinine, Ser 3.00 (*)    Glucose, Bld 134 (*)    Calcium , Ion 1.10 (*)    All other components within normal limits  CBC  TSH  COMPREHENSIVE METABOLIC PANEL WITH GFR    EKG: EKG Interpretation Date/Time:  Wednesday May 25 2024 09:37:41 EST Ventricular Rate:  70 PR Interval:    QRS Duration:  94 QT Interval:  436 QTC Calculation: 470 R Axis:   37  Text Interpretation: Atrial fibrillation with frequent ventricular-paced complexes Cannot rule out Anterior infarct , age undetermined Abnormal ECG  Occasional paced rhythm, otherwise no significant change from last EKG Confirmed by Ellouise Fine ([id number]) on 05/25/2024 9:42:02 AM  Radiology: No results found.   Procedures   Medications Ordered in the ED  sodium chloride  0.9 % bolus 500 mL (0 mLs Intravenous Stopped 05/25/24 1206)  sodium chloride  0.9 % bolus 500 mL (500 mLs Intravenous New Bag/Given 05/25/24 1239)                                    Medical Decision Making Amount and/or Complexity of Data Reviewed Labs: ordered.   BP (!) 98/56 (BP Location: Left Arm)   Pulse 76   Temp 97.9 F (36.6 C) (Oral)   Resp 17   Ht 5' 8 (1.727  m)   Wt 109.6 kg   SpO2 97%   BMI 36.74 kg/m   25:25 AM  73 year old female history of bipolar, aortic valve replacement, thyroid  disease, atrial flutter currently on Coumadin , hypertension, Graves' disease, CHF with EF 55-60% brought here via EMS from [facility] with concerns of generalized fatigue.  Patient states for the past week she has had progressive worsening fatigue, feeling dehydrated, having intermittent episodes of diarrhea, endorsed nausea without vomiting, feeling dizzy and blurry vision.  She did mention that she was having bilateral leg swelling and retaining fluid but her symptoms is improving with Lasix   On exam, patient is resting company in no acute discomfort.  She does not exhibit any peripheral edema.  Her lungs are clear abdomen is soft nontender her strength are equal throughout.  -Labs ordered, independently viewed and interpreted by me.  Labs remarkable for evidence of AKI with a BUN of 130, creatinine of 3.0 which is markedly changed from prior values.  Her INR is therapeutic at 2.9. -The patient was maintained on a cardiac monitor.  I personally viewed and interpreted the cardiac monitored which showed an underlying rhythm of: SR -This patient presents to the ED for concern of fatigue, this involves an extensive number of treatment options, and is a complaint that carries with it a high risk of complications and morbidity.  The differential diagnosis includes dehydration, metabolic derangement, infection, MI, stroke, anemia -Co morbidities that complicate the patient evaluation includes afib, bipolar, stroke, CKD, DM -Treatment includes IVF -Reevaluation of the patient after these medicines showed that the patient improved -PCP office notes or outside notes reviewed -Discussion with specialist [facility] Dr. Tyrus who agrees to admit pt -Escalation to admission/observation considered: admission      Final diagnoses:  AKI (acute kidney injury)  ED  Discharge Orders     None          Nivia Colon, PA-C 05/25/24 1409    Levander Houston, MD 06/09/24 1431  "

## 2024-05-25 NOTE — Assessment & Plan Note (Addendum)
-   follows with cardiology; recently seen 02/02/24 - has been on torsemide, coreg , entresto , spirono, farxiga  - Held all medications on admission -Recent echo reviewed from 02/16/2024, EF 55 to 60%, indeterminate diastolic parameters.  Improved from 35 to 40% since 2023 -Tolerated fluids well -Resuming Coreg , Entresto , spironolactone , Farxiga  at discharge.  She had hypotension on admission and soft blood pressures; she will need close monitoring of blood pressure at follow-up visit -Given improvement in EF, held torsemide at discharge and instructed to only be on as needed basis at least until follow-up with cardiology for further review and medication adjustment - Paged cardiology at discharge to assist with outpatient follow-up

## 2024-05-25 NOTE — H&P (Signed)
 " History and Physical    [name]  FMW:[id number]  DOB: [birth year]  DOA: 05/25/2024  PCP: Feliciano Devoria LABOR, MD Patient coming from: Home  Chief Complaint: weakness  HPI:  [name] is a 73 yo female with PMH sCHF, mech AV replacement, CKD3b, HTN, afib, bipolar, bradycardia, depression, diverticulitis, gout, Grave's, HLD, obesity, SAH/SDH who presented with decreased oral intake and some mild diarrhea.  She endorses having an episode of diarrhea approximately once daily for the past 4 days.  She feels that she has been trying to maintain her normal intake however.  She is also on multiple blood pressure medications and diuresis with torsemide and spironolactone  in setting of her CHF. On workup in the ER she was found to be hypotensive with significantly worsened renal function from baseline. She denied any obvious signs of bleeding notably no hematuria or hematochezia.  Denies any vomiting.  Endorses some fatigue and generalized malaise but no fevers, chills, body aches, cough. BP 98/56 initially in the ER.  BUN initially 126, creatinine 2.81. UA noted with mild glucosuria seen in prior otherwise unremarkable.  I have personally briefly reviewed patient's old medical records in [facility] and discussed patient with the ER provider when appropriate/indicated.  Assessment and Plan: * Acute renal failure superimposed on stage 3b chronic kidney disease (HCC) - patient has history of CKD3b. Baseline creat ~ 1.5, eGFR~ low 30s - patient presents with increase in creat >0.3 mg/dL above baseline or creat increase >1.5x baseline presumed to have occurred within past 7 days PTA - creat 2.81 on admission with BUN 126 - She is not encephalopathic but does have asterixis on exam - Bolus with fluids for now and continue on maintenance - Repeat BMP in a.m.  Chronic systolic CHF (congestive heart failure) (HCC) - follow with cardiology; recently seen 02/02/24 - has been on torsemide, coreg ,  entresto , spirono, farxiga  - Holding all meds for now -Recent echo reviewed from 02/16/2024, EF 55 to 60%, indeterminate diastolic parameters.  Improved from 35 to 40% since 2023 - Monitor tolerance while on fluids -If intake has been normal, may need adjustment of meds given improvement in systolic function  Hx of mechanical aortic valve replacement bicuspid AV/AS with ascending aortic aneurysm s/p resection/grafting of ascending aortic aneurysm and placement of [employer]. Jude mechanical AV conduit/[facility] 2009  - remains on coumadin ; INR goal 2.5 - 3.5  PAD (peripheral artery disease) - Hold fenofibrate  and Crestor  for now  History of CVA (cerebrovascular accident) - On statin and Coumadin   Hypothyroidism - Continue Synthroid   Bipolar disorder (HCC) - Continue Lamictal   Essential hypertension - All meds on hold  Type 2 diabetes mellitus with diabetic polyneuropathy, without long-term current use of insulin  (HCC) - No home meds noted - Well-controlled historically - Update A1c in the morning -Diet control for now otherwise  Hyperlipidemia associated with type 2 diabetes mellitus (HCC) - Hold fenofibrate  and Crestor  for now  Atrial fibrillation (HCC) - now in permanent afib; taken off amio per EP     Code Status:     Code Status: Limited: Do not attempt resuscitation (DNR) -DNR-LIMITED -Do Not Intubate/DNI   DVT Prophylaxis: Coumadin      Anticipated disposition is to: Home  History: Past Medical History:  Diagnosis Date   Acute cystitis without hematuria 07/24/2020   Acute hypoxemic respiratory failure (HCC) 06/27/2021   Acute on chronic combined systolic and diastolic CHF (congestive heart failure) (HCC) 07/24/2020   Altered mental status 04/29/2020  ANEMIA 05/28/2010   Ankle pain 02/04/2018   AORTIC VALVE REPLACEMENT, HX OF 03/11/2010   Mechanical prosthesis   Ascending aortic aneurysm    Atrial fibrillation (HCC)    Atrial flutter (HCC)    BIPOLAR AFFECTIVE  DISORDER 03/11/2010   Bradycardia    Cellulitis and abscess of left leg 11/18/2010   Chronic diastolic CHF (congestive heart failure) (HCC)    Chronic kidney disease    Constipation 03/11/2010   Qualifier: Diagnosis of   By: Domenica MD, Stacey         Depression    Diabetes [facility])    Diverticulitis 12/18/2010   Elevated TSH 04/30/2020   Fall at home, initial encounter 07/24/2020   Falls    FIBROIDS, UTERUS 03/11/2010   Gout 09/04/2016   Grave's disease 12/2010   Hyperlipidemia associated with type 2 diabetes mellitus (HCC) 06/05/2016   Hypertension    Hyperthyroidism 12/18/2010   In the context of amiodarone , resolved off it     Low back pain 11/02/2017   Migraine 06/05/2016   Mixed hyperlipidemia 03/11/2010   Near syncope 08/21/2019   Obesity    Pacemaker    Prolonged QT interval    SAH (subarachnoid hemorrhage) (HCC)    Skin cancer    Subarachnoid hematoma (HCC) 07/24/2020   Subdural hematoma (HCC)    Supratherapeutic INR 04/30/2020   Syncope 08/22/2019   UTI (lower urinary tract infection) 08/14/2011   UTI (urinary tract infection) 02/10/2013    Past Surgical History:  Procedure Laterality Date   ABDOMINAL HYSTERECTOMY  ?1998   sb/l spo, total for fibroids/heavy bleeding   ANKLE SURGERY Left 01/2018   hardware removal   AORTIC VALVE REPLACEMENT  2011   Mechanical prosthesis, [employer]. Jude   APPENDECTOMY     Required revision for EColi infection, required recurrent packing   bowel obstruction     Requiring adhesions to be removed   CARDIOVERSION N/A 05/09/2020   Procedure: CARDIOVERSION;  Surgeon: Raford Riggs, MD;  Location: [facility] ENDOSCOPY;  Service: Cardiovascular;  Laterality: N/A;   CARDIOVERSION N/A 09/23/2021   Procedure: CARDIOVERSION;  Surgeon: Rolan Ezra RAMAN, MD;  Location: [facility] ENDOSCOPY;  Service: Cardiovascular;  Laterality: N/A;   CHOLECYSTECTOMY     COLONOSCOPY WITH PROPOFOL  N/A 11/01/2015   Procedure: COLONOSCOPY WITH PROPOFOL ;  Surgeon: Toribio SHAUNNA Cedar,  MD;  Location: [facility] ENDOSCOPY;  Service: Endoscopy;  Laterality: N/A;   CYSTOCELE REPAIR N/A 10/16/2015   Procedure: ANTERIOR REPAIR (CYSTOCELE);  Surgeon: Glenys RAMAN Birk, MD;  Location: [facility] ORS;  Service: Gynecology;  Laterality: N/A;   HERNIA REPAIR  08-16-10   LEFT HEART CATHETERIZATION WITH CORONARY ANGIOGRAM N/A 12/05/2011   Procedure: LEFT HEART CATHETERIZATION WITH CORONARY ANGIOGRAM;  Surgeon: Victory LELON Claudene DOUGLAS, MD;  Location: [facility] CATH LAB;  Service: Cardiovascular;  Laterality: N/A;   PACEMAKER IMPLANT N/A 01/09/2020   Procedure: PACEMAKER IMPLANT;  Surgeon: Waddell Danelle LELON, MD;  Location: [facility] INVASIVE CV LAB;  Service: Cardiovascular;  Laterality: N/A;   RIGHT HEART CATH AND CORONARY ANGIOGRAPHY N/A 07/05/2021   Procedure: RIGHT HEART CATH AND CORONARY ANGIOGRAPHY;  Surgeon: Wonda Sharper, MD;  Location: [facility] INVASIVE CV LAB;  Service: Cardiovascular;  Laterality: N/A;   TEE WITHOUT CARDIOVERSION N/A 09/23/2021   Procedure: TRANSESOPHAGEAL ECHOCARDIOGRAM (TEE);  Surgeon: Rolan Ezra RAMAN, MD;  Location[facility] ENDOSCOPY;  Service: Cardiovascular;  Laterality: N/A;   TOTAL KNEE ARTHROPLASTY Right 02/24/2013   Procedure: RIGHT TOTAL KNEE ARTHROPLASTY;  Surgeon: Reyes JAYSON Billing, MD;  Location: [facility] ORS;  Service: Orthopedics;  Laterality: Right;   TUBAL LIGATION     varicose vein surgery b/l legs       reports that she quit smoking about 34 years ago. Her smoking use included cigarettes. She has never used smokeless tobacco. She reports current alcohol  use. She reports that she does not use drugs.  Allergies[1]  Family History  Problem Relation Age of Onset   Scoliosis Mother    Arthritis Mother        Rheumatoid   Aneurysm Mother        heart   Alcohol  abuse Father    Depression Sister    Depression Brother    Alcohol  abuse Brother    Cancer Maternal Grandmother        colon   Diabetes Maternal Grandfather    Heart disease Maternal Grandfather    Alcohol  abuse Maternal Grandfather     Depression Paternal Grandmother    Diabetes Paternal Grandmother    Depression Paternal Grandfather    Diabetes Paternal Grandfather    Depression Sister    Alcohol  abuse Brother    Depression Brother    Heart disease Brother     Home Medications: Prior to Admission medications  Medication Sig Start Date End Date Taking? Authorizing Provider  acetaminophen  (TYLENOL ) 500 MG tablet Take 1,000 mg by mouth in the morning and at bedtime.   Yes [provider]  carvedilol  (COREG ) 6.25 MG tablet Take 6.25 mg by mouth 2 (two) times daily.   Yes [provider]  dapagliflozin  propanediol (FARXIGA ) 10 MG TABS tablet Take 1 tablet (10 mg total) by mouth daily. 07/08/21  Yes Sheikh, Omair Latif, DO  dextromethorphan (DELSYM) 30 MG/5ML liquid Take 60 mg by mouth 2 (two) times daily as needed for cough.   Yes [provider]  dextromethorphan (DELSYM) 30 MG/5ML liquid Take 60 mg by mouth 2 (two) times daily.   Yes [provider]  DULoxetine (CYMBALTA) 20 MG capsule Take 20 mg by mouth daily.   Yes [provider]  DULoxetine (CYMBALTA) 60 MG capsule Take 60 mg by mouth daily. 08/03/23  Yes [provider]  estradiol  (ESTRACE ) 0.1 MG/GM vaginal cream Place 1 Applicatorful vaginally every Monday, Wednesday, and Friday.   Yes [provider]  fenofibrate  160 MG tablet Take 1 tablet (160 mg total) by mouth daily. 07/25/20  Yes [place], Eric J, DO  fexofenadine (ALLEGRA) 60 MG tablet Take 60 mg by mouth daily.   Yes [provider]  fluticasone  (FLONASE ) 50 MCG/ACT nasal spray Place 2 sprays into both nostrils daily.   Yes [provider]  lamoTRIgine  (LAMICTAL ) 100 MG tablet Take 100 mg by mouth daily.   Yes [provider]  lamoTRIgine  (LAMICTAL ) 200 MG tablet Take 200 mg by mouth at bedtime. 12/13/20  Yes [provider]  levothyroxine  (SYNTHROID ) 75 MCG tablet Take 75 mcg by mouth daily before breakfast.   Yes  [provider]  loperamide (IMODIUM) 2 MG capsule Take 2 mg by mouth 3 (three) times daily as needed for diarrhea or loose stools.   Yes [provider]  methocarbamol (ROBAXIN) 500 MG tablet Take 500 mg by mouth 2 (two) times daily as needed for muscle spasms.   Yes [provider]  metolazone (ZAROXOLYN) 2.5 MG tablet Take 2.5 mg by mouth once a week.   Yes [provider]  nutrition supplement, JUVEN, (JUVEN) PACK Take 1 packet by mouth in the morning and at bedtime.  Yes [provider]  polyvinyl alcohol  (LIQUIFILM TEARS) 1.4 % ophthalmic solution Place 1 drop into both eyes 3 (three) times daily as needed for dry eyes.   Yes [provider]  potassium chloride  SA (KLOR-CON  M) 20 MEQ tablet Take 1 tablet (20 mEq total) by mouth daily. 07/08/21  Yes Sheikh, Omair Latif, DO  Propylene Glycol (LUBRICANT EYE DROP) 0.6 % SOLN Place 1 drop into both eyes at bedtime.   Yes [provider]  rosuvastatin  (CRESTOR ) 40 MG tablet Take 40 mg by mouth at bedtime.   Yes [provider]  sacubitril -valsartan  (ENTRESTO ) 24-26 MG Take 1 tablet by mouth 2 (two) times daily.   Yes [provider]  spironolactone  (ALDACTONE ) 25 MG tablet Take 1 tablet (25 mg total) by mouth daily. 02/02/24 05/25/24 Yes Rolan Ezra RAMAN, MD  torsemide (DEMADEX) 20 MG tablet Take 80 mg by mouth daily.   Yes [provider]  traZODone  (DESYREL ) 100 MG tablet Take 100 mg by mouth daily. TDD: 150 mg 07/27/23  Yes [provider]  traZODone  (DESYREL ) 50 MG tablet Take 50 mg by mouth at bedtime. TDD: 150 mg 07/27/23  Yes [provider]  vitamin B-12 (CYANOCOBALAMIN ) 1000 MCG tablet Take 1,000 mcg by mouth every Monday.   Yes [provider]  Vitamin D , Ergocalciferol , (DRISDOL) 1.25 MG (50000 UNIT) CAPS capsule Take 50,000 Units by mouth every 7 (seven) days. Thursdays   Yes [provider]  warfarin (COUMADIN ) 2 MG  tablet Take 2 mg by mouth every evening. 10/01/20  Yes [provider]  BELSOMRA 20 MG TABS Take 1 tablet by mouth at bedtime. Patient not taking: Reported on 05/25/2024 08/02/23   [provider]  furosemide  (LASIX ) 20 MG tablet Take 1 tablet (20 mg total) by mouth daily. Patient not taking: Reported on 05/25/2024 10/29/23   Hayes Beckey CROME, NP  ipratropium-albuterol  (DUONEB) 0.5-2.5 (3) MG/3ML SOLN  05/03/24   [provider]  sodium chloride  0.9 % infusion Inject 100 mL/hr into the vein every 4 (four) hours. Patient not taking: Reported on 05/25/2024    [provider]  methimazole  (TAPAZOLE ) 5 MG tablet Take 1 tablet (5 mg total) by mouth 3 (three) times daily. 06/24/11 08/31/19  Kassie Mallick, MD    Review of Systems:  Review of Systems  Constitutional:  Positive for malaise/fatigue.  HENT: Negative.    Eyes: Negative.   Respiratory: Negative.    Cardiovascular: Negative.   Gastrointestinal: Negative.   Genitourinary: Negative.   Musculoskeletal: Negative.   Skin: Negative.   Neurological: Negative.   Endo/Heme/Allergies: Negative.   Psychiatric/Behavioral: Negative.      Physical Exam:  Vitals:   05/25/24 1315 05/25/24 1400 05/25/24 1447 05/25/24 1500  BP: 105/61 (!) 101/53  (!) 89/38  Pulse: 77 78  (!) 104  Resp: 16 18  (!) 22  Temp:   98 F (36.7 C)   TempSrc:   Oral   SpO2: 100% 100%  100%  Weight:      Height:       Physical Exam Constitutional:      Appearance: Normal appearance.  HENT:     Head: Normocephalic and atraumatic.     Mouth/Throat:     Mouth: Mucous membranes are moist.  Eyes:     Extraocular Movements: Extraocular movements intact.  Cardiovascular:     Rate and Rhythm: Normal rate. Rhythm irregular.  Pulmonary:     Effort: Pulmonary effort is normal. No respiratory distress.  Breath sounds: Normal breath sounds. No wheezing.  Abdominal:     General: Bowel sounds are normal. There is no distension.      Palpations: Abdomen is soft.     Tenderness: There is no abdominal tenderness.  Musculoskeletal:        General: Normal range of motion.     Cervical back: Normal range of motion and neck supple.     Right lower leg: Edema (trace) present.     Left lower leg: Edema (trace) present.  Skin:    General: Skin is warm and dry.  Neurological:     General: No focal deficit present.     Mental Status: She is alert.     Comments: Asterixis noted in hands bilaterally   Psychiatric:        Mood and Affect: Mood normal.      Labs on Admission:  I have personally reviewed following labs and imaging studies Results for orders placed or performed during the hospital encounter of 05/25/24 (from the past 24 hours)  Urinalysis, Routine w reflex microscopic -Urine, Clean Catch     Status: Abnormal   Collection Time: 05/25/24  9:56 AM  Result Value Ref Range   Color, Urine YELLOW YELLOW   APPearance HAZY (A) CLEAR   Specific Gravity, Urine 1.012 1.005 - 1.030   pH 6.0 5.0 - 8.0   Glucose, UA 50 (A) NEGATIVE mg/dL   Hgb urine dipstick NEGATIVE NEGATIVE   Bilirubin Urine NEGATIVE NEGATIVE   Ketones, ur NEGATIVE NEGATIVE mg/dL   Protein, ur NEGATIVE NEGATIVE mg/dL   Nitrite NEGATIVE NEGATIVE   Leukocytes,Ua NEGATIVE NEGATIVE  Comprehensive metabolic panel with GFR     Status: Abnormal   Collection Time: 05/25/24  9:56 AM  Result Value Ref Range   Sodium 136 135 - 145 mmol/L   Potassium 3.5 3.5 - 5.1 mmol/L   Chloride 92 (L) 98 - 111 mmol/L   CO2 30 22 - 32 mmol/L   Glucose, Bld 137 (H) 70 - 99 mg/dL   BUN 873 (H) 8 - 23 mg/dL   Creatinine, Ser 7.18 (H) 0.44 - 1.00 mg/dL   Calcium  9.8 8.9 - 10.3 mg/dL   Total Protein 7.2 6.5 - 8.1 g/dL   Albumin 4.1 3.5 - 5.0 g/dL   AST 45 (H) 15 - 41 U/L   ALT 15 0 - 44 U/L   Alkaline Phosphatase 32 (L) 38 - 126 U/L   Total Bilirubin 0.6 0.0 - 1.2 mg/dL   GFR, Estimated 17 (L) >60 mL/min   Anion gap 13 5 - 15  Protime-INR     Status: Abnormal    Collection Time: 05/25/24  9:56 AM  Result Value Ref Range   Prothrombin Time 31.8 (H) 11.4 - 15.2 seconds   INR 2.9 (H) 0.8 - 1.2  TSH     Status: None   Collection Time: 05/25/24  9:57 AM  Result Value Ref Range   TSH 2.690 0.350 - 4.500 uIU/mL  CBC     Status: None   Collection Time: 05/25/24 11:00 AM  Result Value Ref Range   WBC 6.8 4.0 - 10.5 K/uL   RBC 4.07 3.87 - 5.11 MIL/uL   Hemoglobin 12.9 12.0 - 15.0 g/dL   HCT 60.9 63.9 - 53.9 %   MCV 95.8 80.0 - 100.0 fL   MCH 31.7 26.0 - 34.0 pg   MCHC 33.1 30.0 - 36.0 g/dL   RDW 86.8 88.4 - 84.4 %   Platelets  216 150 - 400 K/uL   nRBC 0.0 0.0 - 0.2 %  CBG monitoring, ED     Status: Abnormal   Collection Time: 05/25/24 11:00 AM  Result Value Ref Range   Glucose-Capillary 145 (H) 70 - 99 mg/dL  I-stat chem 8, ED (not at [facility], [facility] or [facility])     Status: Abnormal   Collection Time: 05/25/24 12:57 PM  Result Value Ref Range   Sodium 135 135 - 145 mmol/L   Potassium 3.4 (L) 3.5 - 5.1 mmol/L   Chloride 93 (L) 98 - 111 mmol/L   BUN >130 (H) 8 - 23 mg/dL   Creatinine, Ser 6.99 (H) 0.44 - 1.00 mg/dL   Glucose, Bld 865 (H) 70 - 99 mg/dL   Calcium , Ion 1.10 (L) 1.15 - 1.40 mmol/L   TCO2 31 22 - 32 mmol/L   Hemoglobin 12.6 12.0 - 15.0 g/dL   HCT 62.9 63.9 - 53.9 %   *Note: Due to a large number of results and/or encounters for the requested time period, some results have not been displayed. A complete set of results can be found in Results Review.     Radiological Exams on Admission: No results found. No orders to display    Consults called:     EKG: Independently reviewed. Afib, V-paced   Alm Apo, MD [facility] 05/25/2024, 3:56 PM     [1]  Allergies Allergen Reactions   Mucinex [Guaifenesin Er] Other (See Comments)    Severe headaches    Keflex  [Cephalexin ] Other (See Comments)    Headaches Dizziness    Mucinex [Guaifenesin Er] Other (See Comments)    Severe headaches   Sulfa Antibiotics Other (See  Comments)    Headaches    Sulfonamide Derivatives Other (See Comments)    Headaches    "

## 2024-05-25 NOTE — Hospital Course (Addendum)
 [name] is a 73 yo female with PMH sCHF, mech AV replacement, CKD3b, HTN, afib, bipolar, bradycardia, depression, diverticulitis, gout, Grave's, HLD, obesity, SAH/SDH who presented with possible decreased oral intake and some mild diarrhea.  She endorses having an episode of diarrhea approximately once daily for the past 4 days.  She feels that she has been trying to maintain her normal intake however.  She is also on multiple blood pressure medications and diuresis with torsemide and spironolactone  in setting of her CHF. On workup in the ER she was found to be hypotensive with significantly worsened renal function from baseline. She denied any obvious signs of bleeding notably no hematuria or hematochezia.  Denies any vomiting.  Endorses some fatigue and generalized malaise but no fevers, chills, body aches, cough. BP 98/56 initially in the ER.  BUN initially 126, creatinine 2.81. UA noted with mild glucosuria seen in prior otherwise unremarkable.

## 2024-05-25 NOTE — Assessment & Plan Note (Addendum)
 bicuspid AV/AS with ascending aortic aneurysm s/p resection/grafting of ascending aortic aneurysm and placement of [employer]. Jude mechanical AV conduit/[facility] 2009  - remains on coumadin ; INR goal 2.5 - 3.5

## 2024-05-25 NOTE — Progress Notes (Signed)
 ANTICOAGULATION CONSULT NOTE  Pharmacy Consult for Warfarin Indication: mechanical valve  Allergies[1]  Patient Measurements: Height: 5' 8 (172.7 cm) Weight: 109.6 kg (241 lb 10 oz) IBW/kg (Calculated) : 63.9  Vital Signs: Temp: 98 F (36.7 C) (12/31 1447) Temp Source: Oral (12/31 1447) BP: 89/38 (12/31 1500) Pulse Rate: 104 (12/31 1500)  Labs: Recent Labs    05/25/24 0956 05/25/24 1100 05/25/24 1257  HGB  --  12.9 12.6  HCT  --  39.0 37.0  PLT  --  216  --   LABPROT 31.8*  --   --   INR 2.9*  --   --   CREATININE 2.81*  --  3.00*    Estimated Creatinine Clearance: 21.7 mL/min (A) (by C-G formula based on SCr of 3 mg/dL (H)).   Medical History: Past Medical History:  Diagnosis Date   Acute cystitis without hematuria 07/24/2020   Acute hypoxemic respiratory failure (HCC) 06/27/2021   Acute on chronic combined systolic and diastolic CHF (congestive heart failure) (HCC) 07/24/2020   Altered mental status 04/29/2020   ANEMIA 05/28/2010   Ankle pain 02/04/2018   AORTIC VALVE REPLACEMENT, HX OF 03/11/2010   Mechanical prosthesis   Ascending aortic aneurysm    Atrial fibrillation (HCC)    Atrial flutter (HCC)    BIPOLAR AFFECTIVE DISORDER 03/11/2010   Bradycardia    Cellulitis and abscess of left leg 11/18/2010   Chronic diastolic CHF (congestive heart failure) (HCC)    Chronic kidney disease    Constipation 03/11/2010   Qualifier: Diagnosis of   By: Domenica MD, Stacey         Depression    Diabetes [facility])    Diverticulitis 12/18/2010   Elevated TSH 04/30/2020   Fall at home, initial encounter 07/24/2020   Falls    FIBROIDS, UTERUS 03/11/2010   Gout 09/04/2016   Grave's disease 12/2010   Hyperlipidemia associated with type 2 diabetes mellitus (HCC) 06/05/2016   Hypertension    Hyperthyroidism 12/18/2010   In the context of amiodarone , resolved off it     Low back pain 11/02/2017   Migraine 06/05/2016   Mixed hyperlipidemia 03/11/2010   Near syncope  08/21/2019   Obesity    Pacemaker    Prolonged QT interval    SAH (subarachnoid hemorrhage) (HCC)    Skin cancer    Subarachnoid hematoma (HCC) 07/24/2020   Subdural hematoma (HCC)    Supratherapeutic INR 04/30/2020   Syncope 08/22/2019   UTI (lower urinary tract infection) 08/14/2011   UTI (urinary tract infection) 02/10/2013    Medications:  (Not in a hospital admission)  Scheduled:   lamoTRIgine   100 mg Oral Daily   [START ON 05/26/2024] lamoTRIgine   200 mg Oral QHS   [START ON 05/26/2024] levothyroxine   75 mcg Oral QAC breakfast   sodium chloride  flush  3 mL Intravenous Q12H   traZODone   100 mg Oral QHS   [START ON 05/26/2024] traZODone   50 mg Oral QHS   Infusions:   sodium chloride  100 mL/hr at 05/25/24 1631   sodium chloride      PRN: acetaminophen  **OR** acetaminophen   Assessment: 40 yof with a history of HF, Mechanical AV, CKD, HTN, AF, diverticulitis, HLD, SAH/SDH. Patient is presenting with weakness. Warfarin per pharmacy consult placed for mechanical valve.  Patient taking warfarin prior to arrival. Home dose is 2mg  daily. Last taken 12/30 evening per med rec.  PT / INR today is 31.8 / 2.9, which is therapeutic Hgb 12.6; plt 216  Goal of Therapy:  INR Goal 2.5-3.5 Monitor platelets by anticoagulation protocol: Yes   Plan:  Continue 2mg  daily warfarin Monitor for s/s of hemorrhage, daily INR, CBC Watch for new DDIs  Dorn Buttner, PharmD, BCPS 05/25/2024 4:32 PM ED Clinical Pharmacist -  [phone]        [1]  Allergies Allergen Reactions   Mucinex [Guaifenesin Er] Other (See Comments)    Severe headaches    Keflex  [Cephalexin ] Other (See Comments)    Headaches Dizziness    Mucinex [Guaifenesin Er] Other (See Comments)    Severe headaches   Sulfa Antibiotics Other (See Comments)    Headaches    Sulfonamide Derivatives Other (See Comments)    Headaches

## 2024-05-25 NOTE — Assessment & Plan Note (Signed)
"  Continue Synthroid            "

## 2024-05-26 DIAGNOSIS — N179 Acute kidney failure, unspecified: Secondary | ICD-10-CM | POA: Diagnosis not present

## 2024-05-26 DIAGNOSIS — N1832 Chronic kidney disease, stage 3b: Secondary | ICD-10-CM | POA: Diagnosis not present

## 2024-05-26 DIAGNOSIS — I5022 Chronic systolic (congestive) heart failure: Secondary | ICD-10-CM | POA: Diagnosis not present

## 2024-05-26 DIAGNOSIS — Z952 Presence of prosthetic heart valve: Secondary | ICD-10-CM | POA: Diagnosis not present

## 2024-05-26 LAB — CBC WITH DIFFERENTIAL/PLATELET
Abs Immature Granulocytes: 0.01 K/uL (ref 0.00–0.07)
Basophils Absolute: 0.1 K/uL (ref 0.0–0.1)
Basophils Relative: 2 %
Eosinophils Absolute: 0.1 K/uL (ref 0.0–0.5)
Eosinophils Relative: 2 %
HCT: 35.4 % — ABNORMAL LOW (ref 36.0–46.0)
Hemoglobin: 11.5 g/dL — ABNORMAL LOW (ref 12.0–15.0)
Immature Granulocytes: 0 %
Lymphocytes Relative: 20 %
Lymphs Abs: 0.9 K/uL (ref 0.7–4.0)
MCH: 31.2 pg (ref 26.0–34.0)
MCHC: 32.5 g/dL (ref 30.0–36.0)
MCV: 95.9 fL (ref 80.0–100.0)
Monocytes Absolute: 0.5 K/uL (ref 0.1–1.0)
Monocytes Relative: 12 %
Neutro Abs: 2.8 K/uL (ref 1.7–7.7)
Neutrophils Relative %: 64 %
Platelets: 219 K/uL (ref 150–400)
RBC: 3.69 MIL/uL — ABNORMAL LOW (ref 3.87–5.11)
RDW: 13.4 % (ref 11.5–15.5)
WBC: 4.3 K/uL (ref 4.0–10.5)
nRBC: 0 % (ref 0.0–0.2)

## 2024-05-26 LAB — PROTIME-INR
INR: 3.4 — ABNORMAL HIGH (ref 0.8–1.2)
Prothrombin Time: 36.1 s — ABNORMAL HIGH (ref 11.4–15.2)

## 2024-05-26 LAB — MAGNESIUM: Magnesium: 2.4 mg/dL (ref 1.7–2.4)

## 2024-05-26 LAB — HEMOGLOBIN A1C
Hgb A1c MFr Bld: 6.1 % — ABNORMAL HIGH (ref 4.8–5.6)
Mean Plasma Glucose: 128.37 mg/dL

## 2024-05-26 LAB — BASIC METABOLIC PANEL WITH GFR
Anion gap: 11 (ref 5–15)
BUN: 95 mg/dL — ABNORMAL HIGH (ref 8–23)
CO2: 28 mmol/L (ref 22–32)
Calcium: 9.4 mg/dL (ref 8.9–10.3)
Chloride: 102 mmol/L (ref 98–111)
Creatinine, Ser: 1.95 mg/dL — ABNORMAL HIGH (ref 0.44–1.00)
GFR, Estimated: 27 mL/min — ABNORMAL LOW
Glucose, Bld: 125 mg/dL — ABNORMAL HIGH (ref 70–99)
Potassium: 3.7 mmol/L (ref 3.5–5.1)
Sodium: 141 mmol/L (ref 135–145)

## 2024-05-26 LAB — MRSA NEXT GEN BY PCR, NASAL: MRSA by PCR Next Gen: NOT DETECTED

## 2024-05-26 MED ORDER — WHITE PETROLATUM EX OINT
TOPICAL_OINTMENT | CUTANEOUS | Status: DC | PRN
  Administered 2024-05-26: 0.2 via TOPICAL
  Filled 2024-05-26: qty 28.35

## 2024-05-26 MED ORDER — SODIUM CHLORIDE 0.9 % IV SOLN: INTRAVENOUS | Status: DC

## 2024-05-26 MED ORDER — WARFARIN SODIUM 2 MG PO TABS
2.0000 mg | ORAL_TABLET | Freq: Every day | ORAL | Status: DC
  Filled 2024-05-26: qty 1

## 2024-05-26 MED ORDER — OXYCODONE HCL 5 MG PO TABS
5.0000 mg | ORAL_TABLET | ORAL | Status: DC | PRN
  Administered 2024-05-26: 5 mg via ORAL
  Filled 2024-05-26: qty 1

## 2024-05-26 MED ORDER — WARFARIN SODIUM 1 MG PO TABS
1.0000 mg | ORAL_TABLET | Freq: Once | ORAL | Status: AC
  Administered 2024-05-26: 1 mg via ORAL
  Filled 2024-05-26: qty 1

## 2024-05-26 NOTE — ED Notes (Signed)
 Patient complaining of back pain related to ED stretcher. Hospital bed obtained, and patient assisted to change beds with reported relief of pain.

## 2024-05-26 NOTE — Evaluation (Signed)
 Clinical/Bedside Swallow Evaluation Patient Details  Name: HUYEN PERAZZO MRN: [id number] Date of Birth: 08/09/1950  Today's Date: 05/26/2024 Time: SLP Start Time (ACUTE ONLY): 1543 SLP Stop Time (ACUTE ONLY): 1558 SLP Time Calculation (min) (ACUTE ONLY): 15 min  Past Medical History:  Past Medical History:  Diagnosis Date   Acute cystitis without hematuria 07/24/2020   Acute hypoxemic respiratory failure (HCC) 06/27/2021   Acute on chronic combined systolic and diastolic CHF (congestive heart failure) (HCC) 07/24/2020   Altered mental status 04/29/2020   ANEMIA 05/28/2010   Ankle pain 02/04/2018   AORTIC VALVE REPLACEMENT, HX OF 03/11/2010   Mechanical prosthesis   Ascending aortic aneurysm    Atrial fibrillation (HCC)    Atrial flutter (HCC)    BIPOLAR AFFECTIVE DISORDER 03/11/2010   Bradycardia    Cellulitis and abscess of left leg 11/18/2010   Chronic diastolic CHF (congestive heart failure) (HCC)    Chronic kidney disease    Constipation 03/11/2010   Qualifier: Diagnosis of   By: Domenica MD, Stacey         Depression    Diabetes [facility])    Diverticulitis 12/18/2010   Elevated TSH 04/30/2020   Fall at home, initial encounter 07/24/2020   Falls    FIBROIDS, UTERUS 03/11/2010   Gout 09/04/2016   Grave's disease 12/2010   Hyperlipidemia associated with type 2 diabetes mellitus (HCC) 06/05/2016   Hypertension    Hyperthyroidism 12/18/2010   In the context of amiodarone , resolved off it     Low back pain 11/02/2017   Migraine 06/05/2016   Mixed hyperlipidemia 03/11/2010   Near syncope 08/21/2019   Obesity    Pacemaker    Prolonged QT interval    SAH (subarachnoid hemorrhage) (HCC)    Skin cancer    Subarachnoid hematoma (HCC) 07/24/2020   Subdural hematoma (HCC)    Supratherapeutic INR 04/30/2020   Syncope 08/22/2019   UTI (lower urinary tract infection) 08/14/2011   UTI (urinary tract infection) 02/10/2013   Past Surgical History:  Past Surgical History:  Procedure  Laterality Date   ABDOMINAL HYSTERECTOMY  ?1998   sb/l spo, total for fibroids/heavy bleeding   ANKLE SURGERY Left 01/2018   hardware removal   AORTIC VALVE REPLACEMENT  2011   Mechanical prosthesis, [employer]   APPENDECTOMY     Required revision for EColi infection, required recurrent packing   bowel obstruction     Requiring adhesions to be removed   CARDIOVERSION N/A 05/09/2020   Procedure: CARDIOVERSION;  Surgeon: Raford Riggs, MD;  Location: [facility] ENDOSCOPY;  Service: Cardiovascular;  Laterality: N/A;   CARDIOVERSION N/A 09/23/2021   Procedure: CARDIOVERSION;  Surgeon: Rolan Ezra RAMAN, MD;  Location: [facility] ENDOSCOPY;  Service: Cardiovascular;  Laterality: N/A;   CHOLECYSTECTOMY     COLONOSCOPY WITH PROPOFOL  N/A 11/01/2015   Procedure: COLONOSCOPY WITH PROPOFOL ;  Surgeon: Toribio SHAUNNA Cedar, MD;  Location: [facility] ENDOSCOPY;  Service: Endoscopy;  Laterality: N/A;   CYSTOCELE REPAIR N/A 10/16/2015   Procedure: ANTERIOR REPAIR (CYSTOCELE);  Surgeon: Glenys RAMAN Birk, MD;  Location: [facility] ORS;  Service: Gynecology;  Laterality: N/A;   HERNIA REPAIR  08-16-10   LEFT HEART CATHETERIZATION WITH CORONARY ANGIOGRAM N/A 12/05/2011   Procedure: LEFT HEART CATHETERIZATION WITH CORONARY ANGIOGRAM;  Surgeon: Victory LELON Claudene DOUGLAS, MD;  Location[facility] C[facility] LAB;  Service: Cardiovascular;  Laterality: N/A;   PACEMAKER IMPLANT N/A 01/09/2020   Procedure: PACEMAKER IMPLANT;  Surgeon: Waddell Danelle LELON, MD;  Location: [facility] INVASIVE CV LAB;  Service: Cardiovascular;  Laterality: N/A;   RIGHT HEART CATH AND CORONARY ANGIOGRAPHY N/A 07/05/2021   Procedure: RIGHT HEART CATH AND CORONARY ANGIOGRAPHY;  Surgeon: Wonda Sharper, MD;  Location: [facility] INVASIVE CV LAB;  Service: Cardiovascular;  Laterality: N/A;   TEE WITHOUT CARDIOVERSION N/A 09/23/2021   Procedure: TRANSESOPHAGEAL ECHOCARDIOGRAM (TEE);  Surgeon: Rolan Ezra RAMAN, MD;  Location: [facility] ENDOSCOPY;  Service: Cardiovascular;  Laterality: N/A;   TOTAL KNEE ARTHROPLASTY Right 02/24/2013    Procedure: RIGHT TOTAL KNEE ARTHROPLASTY;  Surgeon: Reyes JAYSON Billing, MD;  Location: [facility] ORS;  Service: Orthopedics;  Laterality: Right;   TUBAL LIGATION     varicose vein surgery b/l legs     HPI:  Ms. Chay is a 74 yo female admitted from SNF presented with decreased oral intake and some mild diarrhea. Found to have acute renal failure, chroncic CHF.  CT cervical spine Multilevel spondylosis, disc space height loss, and  degenerative endplate changes greatest at C5-C6 where it is advanced. Posterior disc osteophyte complexes at C5-C6 and C6-C7 cause mild encroachment on the ventral thecal sac. No severe spinal canal narrowing. PMH: GERD (per d/c summary), CHF, mech AV replacement, CKD3b, HTN, afib, bipolar, bradycardia, depression, diverticulitis, gout, Grave's, HLD, obesity, SAH/SDH. Per chart she feels that she has been trying to maintain her normal intake however.    Assessment / Plan / Recommendation  Clinical Impression  Pt presents with normal oropharyngeal function. Safe and efficient ability with sequential sips thin and regular texture from clinical observation. No oromotor, respiratory concerns. Pt reports more intermittent coughing with liquids but states she can prevent it sometimes although cannot recall what she does. Potential etiologies may be from history of GERD although she denies globus sensation, indigestion or regurgitation. She has spondylosis and cervical C5-7 osteophytes that can cause narrowing of PES and does not have pharyngeal globus sensation or history of pneumonia.Stated MD recommended a surgical procedure but she declined. Recommend continue regular texture, thin liquids, good oral hygiene, remain upright after meals and mobility. If symptoms worsen suggested speaking to MD re: candidacy for trial of reflux meds, educated pt re: barium esophagram and or consult with GI doctor. Did mention MBS as potential in future if symptoms worsen. All questions answered. ST to sign  off. SLP Visit Diagnosis: Dysphagia, unspecified (R13.10)    Aspiration Risk  Mild aspiration risk    Diet Recommendation           Other Recommendations Oral Care Recommendations: Oral care BID     Swallow Evaluation Recommendations Recommendations: PO diet PO Diet Recommendation: Regular;Thin liquids (Level 0) Liquid Administration via: Cup;Straw Medication Administration: Whole meds with liquid Supervision: Patient able to self-feed Swallowing strategies  : Slow rate;Small bites/sips Postural changes: Position pt fully upright for meals;Stay upright 30-60 min after meals Oral care recommendations: Oral care BID (2x/day) Recommended consults: Consider GI consultation;Consider esophageal assessment   Assistance Recommended at Discharge    Functional Status Assessment Patient has had a recent decline in their functional status and demonstrates the ability to make significant improvements in function in a reasonable and predictable amount of time.  Frequency and Duration            Prognosis        Swallow Study   General HPI: Ms. Genet is a 74 yo female admitted from SNF presented with decreased oral intake and some mild diarrhea. Found to have acute renal failure, chroncic CHF.  CT cervical spine Multilevel spondylosis, disc space height loss, and  degenerative endplate changes  greatest at C5-C6 where it is advanced. Posterior disc osteophyte complexes at C5-C6 and C6-C7 cause mild encroachment on the ventral thecal sac. No severe spinal canal narrowing. PMH: GERD (per d/c summary), CHF, mech AV replacement, CKD3b, HTN, afib, bipolar, bradycardia, depression, diverticulitis, gout, Grave's, HLD, obesity, SAH/SDH. Per chart she feels that she has been trying to maintain her normal intake however. Type of Study: Bedside Swallow Evaluation Previous Swallow Assessment:  (none) Diet Prior to this Study: Regular;Thin liquids (Level 0) Temperature Spikes Noted: No Respiratory  Status: Room air History of Recent Intubation: No Behavior/Cognition: Alert;Cooperative;Pleasant mood Oral Cavity Assessment: Within Functional Limits Oral Care Completed by SLP: No Oral Cavity - Dentition: Adequate natural dentition Vision: Functional for self-feeding Self-Feeding Abilities: Able to feed self Patient Positioning: Upright in chair Baseline Vocal Quality: Normal Volitional Cough: Strong Volitional Swallow: Able to elicit    Oral/Motor/Sensory Function Overall Oral Motor/Sensory Function: Within functional limits   Ice Chips Ice chips: Not tested   Thin Liquid Thin Liquid: Within functional limits Presentation: Straw    Nectar Thick Nectar Thick Liquid: Not tested   Honey Thick Honey Thick Liquid: Not tested   Puree Puree: Not tested   Solid     Solid: Within functional limits      Dustin Olam Bull 05/26/2024,4:14 PM

## 2024-05-26 NOTE — Progress Notes (Addendum)
 " Progress Note    [name]   FMW:[id number]  DOB: Feb 11, 1951  DOA: 05/25/2024     1 PCP: Feliciano Devoria LABOR, MD  Initial CC: weakness, lethargy, general malaise  Hospital Course: Ms. [name] is a 74 yo female with PMH sCHF, mech AV replacement, CKD3b, HTN, afib, bipolar, bradycardia, depression, diverticulitis, gout, Grave's, HLD, obesity, SAH/SDH who presented with decreased oral intake and some mild diarrhea.  She endorses having an episode of diarrhea approximately once daily for the past 4 days.  She feels that she has been trying to maintain her normal intake however.  She is also on multiple blood pressure medications and diuresis with torsemide and spironolactone  in setting of her CHF. On workup in the ER she was found to be hypotensive with significantly worsened renal function from baseline. She denied any obvious signs of bleeding notably no hematuria or hematochezia.  Denies any vomiting.  Endorses some fatigue and generalized malaise but no fevers, chills, body aches, cough. BP 98/56 initially in the ER.  BUN initially 126, creatinine 2.81. UA noted with mild glucosuria seen in prior otherwise unremarkable.  Interval History:  Looks better this morning although states she is still feeling off.  But her energy seems improved and asterixis is better. Renal function including creatinine and BUN also much better this morning after fluids overnight.  Assessment and Plan: * Acute renal failure superimposed on stage 3b chronic kidney disease (HCC) - patient has history of CKD3b. Baseline creat ~ 1.5, eGFR~ low 30s - patient presents with increase in creat >0.3 mg/dL above baseline or creat increase >1.5x baseline presumed to have occurred within past 7 days PTA - creat 2.81 on admission with BUN 126 - She is not encephalopathic but does have asterixis on exam - Responded well to fluids since admission - Continue maintenance fluids - BMP in a.m.  Chronic systolic CHF  (congestive heart failure) (HCC) - follow with cardiology; recently seen 02/02/24 - has been on torsemide, coreg , entresto , spirono, farxiga  - Holding all meds for now -Recent echo reviewed from 02/16/2024, EF 55 to 60%, indeterminate diastolic parameters.  Improved from 35 to 40% since 2023 - Monitor tolerance while on fluids -If intake has been normal, may need adjustment of meds given improvement in systolic function  Hx of mechanical aortic valve replacement bicuspid AV/AS with ascending aortic aneurysm s/p resection/grafting of ascending aortic aneurysm and placement of [employer] Jude mechanical AV conduit/[place] 2009  - remains on coumadin ; INR goal 2.5 - 3.5  PAD (peripheral artery disease) - Hold fenofibrate  and Crestor  for now  History of CVA (cerebrovascular accident) - On statin and Coumadin   Hypothyroidism - Continue Synthroid   Bipolar disorder (HCC) - Continue Lamictal   Essential hypertension - All meds on hold  Type 2 diabetes mellitus with diabetic polyneuropathy, without long-term current use of insulin  (HCC) - No home meds noted - Well-controlled historically - Update A1c -Diet control for now otherwise  Hyperlipidemia associated with type 2 diabetes mellitus (HCC) - Hold fenofibrate  and Crestor  for now  Atrial fibrillation (HCC) - now in permanent afib; taken off amio per EP    Antimicrobials:   DVT prophylaxis:   warfarin (COUMADIN ) tablet 2 mg   Code Status:   Code Status: Limited: Do not attempt resuscitation (DNR) -DNR-LIMITED -Do Not Intubate/DNI   Mobility Assessment (Last 72 Hours)     Mobility Assessment     Row Name 05/26/24 1342           Does the  patient have exclusion criteria? No- Perform mobility assessment       What is the highest level of mobility based on the mobility assessment? Level 4 (Ambulates with assistance) - Balance while stepping forward/back - Complete          Diet: Diet Orders (From admission, onward)     Start      Ordered   05/25/24 1539  Diet regular Fluid consistency: Thin  Diet effective now       Question:  Fluid consistency:  Answer:  Thin   05/25/24 1538            Therapy evaluation: PT Orders:   PT Follow up Rec:   Barriers to discharge: None Disposition Plan: Home Status is: Inpatient  Objective: Blood pressure (!) 104/53, pulse 61, temperature 98.6 F (37 C), temperature source Oral, resp. rate 20, height 5' 8 (1.727 m), weight 109.6 kg, SpO2 94%.  Examination:  Physical Exam Constitutional:      Appearance: Normal appearance.  HENT:     Head: Normocephalic and atraumatic.     Mouth/Throat:     Mouth: Mucous membranes are moist.  Eyes:     Extraocular Movements: Extraocular movements intact.  Cardiovascular:     Rate and Rhythm: Normal rate. Rhythm irregular.  Pulmonary:     Effort: Pulmonary effort is normal. No respiratory distress.     Breath sounds: Normal breath sounds. No wheezing.  Abdominal:     General: Bowel sounds are normal. There is no distension.     Palpations: Abdomen is soft.     Tenderness: There is no abdominal tenderness.  Musculoskeletal:        General: Normal range of motion.     Cervical back: Normal range of motion and neck supple.     Right lower leg: Edema (trace) present.     Left lower leg: Edema (trace) present.  Skin:    General: Skin is warm and dry.  Neurological:     General: No focal deficit present.     Mental Status: She is alert.     Comments: Asterixis noted in hands bilaterally, improved today and minimal  Psychiatric:        Mood and Affect: Mood normal.      Consultants:    Procedures:    Data Reviewed: Results for orders placed or performed during the hospital encounter of 05/25/24 (from the past 24 hours)  Basic metabolic panel with GFR     Status: Abnormal   Collection Time: 05/26/24  3:37 AM  Result Value Ref Range   Sodium 141 135 - 145 mmol/L   Potassium 3.7 3.5 - 5.1 mmol/L   Chloride 102 98 -  111 mmol/L   CO2 28 22 - 32 mmol/L   Glucose, Bld 125 (H) 70 - 99 mg/dL   BUN 95 (H) 8 - 23 mg/dL   Creatinine, Ser 8.04 (H) 0.44 - 1.00 mg/dL   Calcium  9.4 8.9 - 10.3 mg/dL   GFR, Estimated 27 (L) >60 mL/min   Anion gap 11 5 - 15  CBC with Differential/Platelet     Status: Abnormal   Collection Time: 05/26/24  3:37 AM  Result Value Ref Range   WBC 4.3 4.0 - 10.5 K/uL   RBC 3.69 (L) 3.87 - 5.11 MIL/uL   Hemoglobin 11.5 (L) 12.0 - 15.0 g/dL   HCT 64.5 (L) 63.9 - 53.9 %   MCV 95.9 80.0 - 100.0 fL   MCH 31.2 26.0 - 34.0  pg   MCHC 32.5 30.0 - 36.0 g/dL   RDW 86.5 88.4 - 84.4 %   Platelets 219 150 - 400 K/uL   nRBC 0.0 0.0 - 0.2 %   Neutrophils Relative % 64 %   Neutro Abs 2.8 1.7 - 7.7 K/uL   Lymphocytes Relative 20 %   Lymphs Abs 0.9 0.7 - 4.0 K/uL   Monocytes Relative 12 %   Monocytes Absolute 0.5 0.1 - 1.0 K/uL   Eosinophils Relative 2 %   Eosinophils Absolute 0.1 0.0 - 0.5 K/uL   Basophils Relative 2 %   Basophils Absolute 0.1 0.0 - 0.1 K/uL   Immature Granulocytes 0 %   Abs Immature Granulocytes 0.01 0.00 - 0.07 K/uL  Magnesium      Status: None   Collection Time: 05/26/24  3:37 AM  Result Value Ref Range   Magnesium  2.4 1.7 - 2.4 mg/dL  Protime-INR     Status: Abnormal   Collection Time: 05/26/24  3:37 AM  Result Value Ref Range   Prothrombin Time 36.1 (H) 11.4 - 15.2 seconds   INR 3.4 (H) 0.8 - 1.2   *Note: Due to a large number of results and/or encounters for the requested time period, some results have not been displayed. A complete set of results can be found in Results Review.    I have reviewed pertinent nursing notes, vitals, labs, and images as necessary. I have ordered labwork to follow up on as indicated.  I have reviewed the last notes from staff over past 24 hours. I have discussed patient's care plan and test results with nursing staff, CM/SW, and other staff as appropriate.  Old records reviewed in assessment of this patient  Time spent: Greater  than 50% of the 55 minute visit was spent in counseling/coordination of care for the patient as laid out in the A&P.   LOS: 1 day   Alm Apo, MD [facility] 05/26/2024, 5:13 PM "

## 2024-05-26 NOTE — Progress Notes (Signed)
 ANTICOAGULATION CONSULT NOTE  Pharmacy Consult for Warfarin Indication: mechanical valve  Allergies[1]  Patient Measurements: Height: 5' 8 (172.7 cm) Weight: 109.6 kg (241 lb 10 oz) IBW/kg (Calculated) : 63.9  Vital Signs: Temp: 97.9 F (36.6 C) (01/01 0411) Temp Source: Oral (01/01 0411) BP: 113/57 (01/01 0530) Pulse Rate: 74 (01/01 0530)  Labs: Recent Labs    05/25/24 0956 05/25/24 1100 05/25/24 1100 05/25/24 1257 05/26/24 0337  HGB  --  12.9   < > 12.6 11.5*  HCT  --  39.0  --  37.0 35.4*  PLT  --  216  --   --  219  LABPROT 31.8*  --   --   --  36.1*  INR 2.9*  --   --   --  3.4*  CREATININE 2.81*  --   --  3.00* 1.95*   < > = values in this interval not displayed.    Estimated Creatinine Clearance: 33.3 mL/min (A) (by C-G formula based on SCr of 1.95 mg/dL (H)).   Medical History: Past Medical History:  Diagnosis Date   Acute cystitis without hematuria 07/24/2020   Acute hypoxemic respiratory failure (HCC) 06/27/2021   Acute on chronic combined systolic and diastolic CHF (congestive heart failure) (HCC) 07/24/2020   Altered mental status 04/29/2020   ANEMIA 05/28/2010   Ankle pain 02/04/2018   AORTIC VALVE REPLACEMENT, HX OF 03/11/2010   Mechanical prosthesis   Ascending aortic aneurysm    Atrial fibrillation (HCC)    Atrial flutter (HCC)    BIPOLAR AFFECTIVE DISORDER 03/11/2010   Bradycardia    Cellulitis and abscess of left leg 11/18/2010   Chronic diastolic CHF (congestive heart failure) (HCC)    Chronic kidney disease    Constipation 03/11/2010   Qualifier: Diagnosis of   By: Domenica MD, Stacey         Depression    Diabetes [facility])    Diverticulitis 12/18/2010   Elevated TSH 04/30/2020   Fall at home, initial encounter 07/24/2020   Falls    FIBROIDS, UTERUS 03/11/2010   Gout 09/04/2016   Grave's disease 12/2010   Hyperlipidemia associated with type 2 diabetes mellitus (HCC) 06/05/2016   Hypertension    Hyperthyroidism 12/18/2010   In the  context of amiodarone , resolved off it     Low back pain 11/02/2017   Migraine 06/05/2016   Mixed hyperlipidemia 03/11/2010   Near syncope 08/21/2019   Obesity    Pacemaker    Prolonged QT interval    SAH (subarachnoid hemorrhage) (HCC)    Skin cancer    Subarachnoid hematoma (HCC) 07/24/2020   Subdural hematoma (HCC)    Supratherapeutic INR 04/30/2020   Syncope 08/22/2019   UTI (lower urinary tract infection) 08/14/2011   UTI (urinary tract infection) 02/10/2013    Medications:  (Not in a hospital admission)  Scheduled:   lamoTRIgine   100 mg Oral Daily   lamoTRIgine   200 mg Oral QHS   levothyroxine   75 mcg Oral QAC breakfast   sodium chloride  flush  3 mL Intravenous Q12H   traZODone   100 mg Oral QHS   traZODone   50 mg Oral QHS   warfarin  2 mg Oral q1600   Warfarin - Pharmacist Dosing Inpatient   Does not apply q1600   Infusions:   sodium chloride  100 mL/hr at 05/26/24 0234   PRN: acetaminophen  **OR** acetaminophen   Assessment: 61 yof with a history of HF, Mechanical AV, CKD, HTN, AF, diverticulitis, HLD, SAH/SDH. Patient is presenting with weakness. Warfarin  per pharmacy consult placed for mechanical valve.  Patient taking warfarin prior to arrival. Home dose is 2mg  daily. Last taken 12/30 evening per med rec.  INR 3.4 today is therapeutic, but increase by 0.6 since yesterday.  Hgb 11.5; plt 219  Goal of Therapy:  INR Goal 2.5-3.5 Monitor platelets by anticoagulation protocol: Yes   Plan:  1 mg warfarin tonight, resume 2 mg daily tomorrow  Monitor for s/s of hemorrhage, daily INR, CBC Watch for new DDIs  Rankin Sams, PharmD, BCPS, BCCCP Clinical Pharmacist         [1]  Allergies Allergen Reactions   Mucinex [Guaifenesin Er] Other (See Comments)    Severe headaches    Keflex  [Cephalexin ] Other (See Comments)    Headaches Dizziness    Mucinex [Guaifenesin Er] Other (See Comments)    Severe headaches   Sulfa Antibiotics Other (See Comments)     Headaches    Sulfonamide Derivatives Other (See Comments)    Headaches

## 2024-05-27 ENCOUNTER — Other Ambulatory Visit (HOSPITAL_COMMUNITY): Payer: Self-pay

## 2024-05-27 DIAGNOSIS — N179 Acute kidney failure, unspecified: Secondary | ICD-10-CM | POA: Diagnosis not present

## 2024-05-27 DIAGNOSIS — N1832 Chronic kidney disease, stage 3b: Secondary | ICD-10-CM | POA: Diagnosis not present

## 2024-05-27 DIAGNOSIS — I5022 Chronic systolic (congestive) heart failure: Secondary | ICD-10-CM | POA: Diagnosis not present

## 2024-05-27 LAB — BASIC METABOLIC PANEL WITH GFR
Anion gap: 9 (ref 5–15)
BUN: 43 mg/dL — ABNORMAL HIGH (ref 8–23)
CO2: 27 mmol/L (ref 22–32)
Calcium: 9.2 mg/dL (ref 8.9–10.3)
Chloride: 108 mmol/L (ref 98–111)
Creatinine, Ser: 1.27 mg/dL — ABNORMAL HIGH (ref 0.44–1.00)
GFR, Estimated: 44 mL/min — ABNORMAL LOW
Glucose, Bld: 116 mg/dL — ABNORMAL HIGH (ref 70–99)
Potassium: 3.7 mmol/L (ref 3.5–5.1)
Sodium: 144 mmol/L (ref 135–145)

## 2024-05-27 LAB — PROTIME-INR
INR: 3 — ABNORMAL HIGH (ref 0.8–1.2)
Prothrombin Time: 32.2 s — ABNORMAL HIGH (ref 11.4–15.2)

## 2024-05-27 MED ORDER — TORSEMIDE 20 MG PO TABS
20.0000 mg | ORAL_TABLET | Freq: Every day | ORAL | 1 refills | Status: DC | PRN
  Filled 2024-05-27: qty 30, 30d supply, fill #0

## 2024-05-27 NOTE — TOC Transition Note (Signed)
 Transition of Care [facility]) - Discharge Note   Patient Details  Name: [name] MRN: [id number] Date of Birth: April 16, 1951  [facility] CM/SW Contact:  Jeoffrey LITTIE [name] Phone Number: 05/27/2024, 1:41 PM   Clinical Narrative:    Patient will DC to: [place] Anticipated DC date: 05/27/24 Family notified: Yes Transport by: Daughter   Per MD patient ready for DC to [place]. RN to call report prior to discharge [phone] . RN, patient, patient's family, and facility notified of DC. Discharge Summary and FL2 sent to facility. DC packet on chart. Daughter to transport patient to facility.  CSW will sign off for now as social work intervention is no longer needed. Please consult us  again if new needs arise.     Final next level of care: Skilled Nursing Facility Barriers to Discharge: Barriers Resolved   Patient Goals and CMS Choice Patient states their goals for this hospitalization and ongoing recovery are:: return to [facility]          Discharge Placement              Patient chooses bed at: [facility] Patient to be transferred to facility by: Daughter Name of family member notified: Amy Patient and family notified of of transfer: 05/27/24  Discharge Plan and Services Additional resources added to the After Visit Summary for                                       Social Drivers of Health (SDOH) Interventions SDOH Screenings   Food Insecurity: No Food Insecurity (05/26/2024)  Housing: Low Risk (05/26/2024)  Transportation Needs: No Transportation Needs (05/26/2024)  Utilities: Not At Risk (05/26/2024)  Social Connections: Moderately Integrated (05/26/2024)  Tobacco Use: Medium Risk (05/25/2024)     Readmission Risk Interventions     No data to display

## 2024-05-27 NOTE — Discharge Summary (Signed)
 " Physician Discharge Summary   [name] FMW:[id number] DOB: 1950/11/15 DOA: 05/25/2024  PCP: Feliciano Devoria LABOR, MD  Admit date: 05/25/2024 Discharge date: 05/27/2024  Admitted From: [facility] Disposition:  [place] Discharging physician: Alm Apo, MD Barriers to discharge: none  Recommendations at discharge: Address torsemide dosing regimen with cardiology at follow up Follow up BP and if any further med adjustment needed as well    Discharge Condition: stable CODE STATUS: DNR Diet recommendation:  Diet Orders (From admission, onward)     Start     Ordered   05/27/24 0000  Diet general        05/27/24 0932   05/25/24 1539  Diet regular Fluid consistency: Thin  Diet effective now       Question:  Fluid consistency:  Answer:  Thin   05/25/24 1538            Hospital Course: [name] is a 73 yo female with PMH sCHF, mech AV replacement, CKD3b, HTN, afib, bipolar, bradycardia, depression, diverticulitis, gout, Grave's, HLD, obesity, SAH/SDH who presented with possible decreased oral intake and some mild diarrhea.  She endorses having an episode of diarrhea approximately once daily for the past 4 days.  She feels that she has been trying to maintain her normal intake however.  She is also on multiple blood pressure medications and diuresis with torsemide and spironolactone  in setting of her CHF. On workup in the ER she was found to be hypotensive with significantly worsened renal function from baseline. She denied any obvious signs of bleeding notably no hematuria or hematochezia.  Denies any vomiting.  Endorses some fatigue and generalized malaise but no fevers, chills, body aches, cough. BP 98/56 initially in the ER.  BUN initially 126, creatinine 2.81. UA noted with mild glucosuria seen in prior otherwise unremarkable.  Assessment and Plan: * Acute renal failure superimposed on stage 3b chronic kidney disease (HCC) - patient has history of CKD3b. Baseline creat ~ 1.5,  eGFR~ low 30s - patient presents with increase in creat >0.3 mg/dL above baseline or creat increase >1.5x baseline presumed to have occurred within past 7 days PTA - creat 2.81 on admission with BUN 126 - She is not encephalopathic but does have asterixis on exam - Responded well to fluids since admission - strongly suspect she was overdiuresed as she declines much decreased oral intake - Creatinine improved to 1.27 at time of discharge.  BUN down to 43 also -Diuretics adjusted at discharge,  Chronic systolic CHF (congestive heart failure) (HCC) - follows with cardiology; recently seen 02/02/24 - has been on torsemide, coreg , entresto , spirono, farxiga  - Held all medications on admission -Recent echo reviewed from 02/16/2024, EF 55 to 60%, indeterminate diastolic parameters.  Improved from 35 to 40% since 2023 -Tolerated fluids well -Resuming Coreg , Entresto , spironolactone , Farxiga  at discharge.  She had hypotension on admission and soft blood pressures; she will need close monitoring of blood pressure at follow-up visit -Given improvement in EF, held torsemide at discharge and instructed to only be on as needed basis at least until follow-up with cardiology for further review and medication adjustment - Paged cardiology at discharge to assist with outpatient follow-up  Hx of mechanical aortic valve replacement bicuspid AV/AS with ascending aortic aneurysm s/p resection/grafting of ascending aortic aneurysm and placement of [employer] mechanical AV conduit/[facility] 2009  - remains on coumadin ; INR goal 2.5 - 3.5  PAD (peripheral artery disease) - Continue fenofibrate  and Crestor   History of CVA (cerebrovascular accident) -  On statin and Coumadin   Hypothyroidism - Continue Synthroid   Bipolar disorder (HCC) - Continue Lamictal   Essential hypertension - Meds resumed at discharge except for torsemide  Type 2 diabetes mellitus with diabetic polyneuropathy, without long-term current use of  insulin  (HCC) - No home meds noted - Well-controlled historically - A1c updated, 6.1% -Diet control for now otherwise  Hyperlipidemia associated with type 2 diabetes mellitus (HCC) - Resume Crestor  and fenofibrate   Atrial fibrillation (HCC) - now in permanent afib; taken off amio per EP    The patient's acute and chronic medical conditions were treated accordingly. On day of discharge, patient was felt deemed stable for discharge. Patient/family member advised to call PCP or come back to ER if needed.   Principal Diagnosis: Acute renal failure superimposed on stage 3b chronic kidney disease [facility])  Discharge Diagnoses: Active Hospital Problems   Diagnosis Date Noted   Acute renal failure superimposed on stage 3b chronic kidney disease (HCC) 07/24/2020    Priority: 1.   Chronic systolic CHF (congestive heart failure) (HCC) 05/25/2024    Priority: 2.   Hx of mechanical aortic valve replacement 03/11/2010    Priority: 3.   PAD (peripheral artery disease) 02/02/2024   History of CVA (cerebrovascular accident) 07/03/2021   Obesity (BMI 30-39.9) 07/03/2021   Bipolar disorder (HCC) 07/24/2020   Essential hypertension 07/24/2020   Hypothyroidism 07/24/2020   GAD (generalized anxiety disorder) 04/25/2018   Type 2 diabetes mellitus with diabetic polyneuropathy, without long-term current use of insulin  (HCC) 11/02/2017   Hyperlipidemia associated with type 2 diabetes mellitus (HCC) 06/05/2016   Atrial fibrillation (HCC) 03/18/2013   Depression 03/01/2013    Resolved Hospital Problems  No resolved problems to display.     Discharge Instructions     Diet general   Complete by: As directed    Increase activity slowly   Complete by: As directed    No wound care   Complete by: As directed       Allergies as of 05/27/2024       Reactions   Mucinex [guaifenesin Er] Other (See Comments)   Severe headaches   Keflex  [cephalexin ] Other (See Comments)   Headaches Dizziness     Mucinex [guaifenesin Er] Other (See Comments)   Severe headaches   Sulfa Antibiotics Other (See Comments)   Headaches    Sulfonamide Derivatives Other (See Comments)   Headaches        Medication List     STOP taking these medications    Delsym 30 MG/5ML liquid Generic drug: dextromethorphan   furosemide  20 MG tablet Commonly known as: LASIX    ipratropium-albuterol  0.5-2.5 (3) MG/3ML Soln Commonly known as: DUONEB   metolazone 2.5 MG tablet Commonly known as: ZAROXOLYN   potassium chloride  SA 20 MEQ tablet Commonly known as: KLOR-CON  M   sodium chloride  0.9 % infusion       TAKE these medications    acetaminophen  500 MG tablet Commonly known as: TYLENOL  Take 1,000 mg by mouth in the morning and at bedtime.   artificial tears ophthalmic solution Place 1 drop into both eyes 3 (three) times daily as needed for dry eyes.   Belsomra 20 MG Tabs Generic drug: Suvorexant Take 1 tablet by mouth at bedtime.   carvedilol  6.25 MG tablet Commonly known as: COREG  Take 6.25 mg by mouth 2 (two) times daily.   cyanocobalamin  1000 MCG tablet Commonly known as: VITAMIN B12 Take 1,000 mcg by mouth every Monday.   dapagliflozin  propanediol 10 MG Tabs tablet  Commonly known as: FARXIGA  Take 1 tablet (10 mg total) by mouth daily.   DULoxetine 20 MG capsule Commonly known as: CYMBALTA Take 20 mg by mouth daily.   DULoxetine 60 MG capsule Commonly known as: CYMBALTA Take 60 mg by mouth daily.   Entresto  24-26 MG Generic drug: sacubitril -valsartan  Take 1 tablet by mouth 2 (two) times daily.   estradiol  0.1 MG/GM vaginal cream Commonly known as: ESTRACE  Place 1 Applicatorful vaginally every Monday, Wednesday, and Friday.   fenofibrate  160 MG tablet Take 1 tablet (160 mg total) by mouth daily.   fexofenadine 60 MG tablet Commonly known as: ALLEGRA Take 60 mg by mouth daily.   fluticasone  50 MCG/ACT nasal spray Commonly known as: FLONASE  Place 2 sprays into both  nostrils daily.   lamoTRIgine  100 MG tablet Commonly known as: LAMICTAL  Take 100 mg by mouth daily.   lamoTRIgine  200 MG tablet Commonly known as: LAMICTAL  Take 200 mg by mouth at bedtime.   levothyroxine  75 MCG tablet Commonly known as: SYNTHROID  Take 75 mcg by mouth daily before breakfast.   loperamide 2 MG capsule Commonly known as: IMODIUM Take 2 mg by mouth 3 (three) times daily as needed for diarrhea or loose stools.   Lubricant Eye Drop 0.6 % Soln Generic drug: Propylene Glycol Place 1 drop into both eyes at bedtime.   methocarbamol 500 MG tablet Commonly known as: ROBAXIN Take 500 mg by mouth 2 (two) times daily as needed for muscle spasms.   nutrition supplement (JUVEN) Pack Take 1 packet by mouth in the morning and at bedtime.   rosuvastatin  40 MG tablet Commonly known as: CRESTOR  Take 40 mg by mouth at bedtime.   spironolactone  25 MG tablet Commonly known as: Aldactone  Take 1 tablet (25 mg total) by mouth daily.   torsemide 20 MG tablet Commonly known as: DEMADEX Take 1 tablet (20 mg total) by mouth daily as needed. Only take if developing swelling in legs or gaining weight approx 3 pounds in 24-48 hour time period. What changed:  how much to take when to take this reasons to take this additional instructions   traZODone  100 MG tablet Commonly known as: DESYREL  Take 100 mg by mouth daily. TDD: 150 mg   traZODone  50 MG tablet Commonly known as: DESYREL  Take 50 mg by mouth at bedtime. TDD: 150 mg   Vitamin D  (Ergocalciferol ) 1.25 MG (50000 UNIT) Caps capsule Commonly known as: DRISDOL Take 50,000 Units by mouth every 7 (seven) days. Thursdays   warfarin 2 MG tablet Commonly known as: COUMADIN  Take 2 mg by mouth every evening.        Allergies[1]  Consultations:  Procedures:   Discharge Exam: BP 123/80 (BP Location: Right Arm)   Pulse 81   Temp 98.1 F (36.7 C) (Oral)   Resp 16   Ht 5' 8 (1.727 m)   Wt 109.6 kg   SpO2 99%    BMI 36.74 kg/m  Physical Exam Constitutional:      Appearance: Normal appearance.  HENT:     Head: Normocephalic and atraumatic.     Mouth/Throat:     Mouth: Mucous membranes are moist.  Eyes:     Extraocular Movements: Extraocular movements intact.  Cardiovascular:     Rate and Rhythm: Normal rate. Rhythm irregular.  Pulmonary:     Effort: Pulmonary effort is normal. No respiratory distress.     Breath sounds: Normal breath sounds. No wheezing.  Abdominal:     General: Bowel sounds are normal. There  is no distension.     Palpations: Abdomen is soft.     Tenderness: There is no abdominal tenderness.  Musculoskeletal:        General: Normal range of motion.     Cervical back: Normal range of motion and neck supple.     Right lower leg: Edema (trace) present.     Left lower leg: Edema (trace) present.  Skin:    General: Skin is warm and dry.  Neurological:     General: No focal deficit present.     Mental Status: She is alert.     Comments: Asterixis noted in hands bilaterally, improved today and minimal  Psychiatric:        Mood and Affect: Mood normal.      The results of significant diagnostics from this hospitalization (including imaging, microbiology, ancillary and laboratory) are listed below for reference.   Microbiology: Recent Results (from the past 240 hours)  MRSA Next Gen by PCR, Nasal     Status: None   Collection Time: 05/26/24  7:37 PM   Specimen: Nasal Mucosa; Nasal Swab  Result Value Ref Range Status   MRSA by PCR Next Gen NOT DETECTED NOT DETECTED Final    Comment: (NOTE) The GeneXpert MRSA Assay (FDA approved for NASAL specimens only), is one component of a comprehensive MRSA colonization surveillance program. It is not intended to diagnose MRSA infection nor to guide or monitor treatment for MRSA infections. Test performance is not FDA approved in patients less than 34 years old. Performed at [facility], [address] [place],  [place] [address]      Labs: BNP (last 3 results) Recent Labs    10/29/23 1258 02/02/24 1421  BNP 91.6 42.4   Basic Metabolic Panel: Recent Labs  Lab 05/25/24 0956 05/25/24 1257 05/26/24 0337 05/27/24 0613  NA 136 135 141 144  K 3.5 3.4* 3.7 3.7  CL 92* 93* 102 108  CO2 30  --  28 27  GLUCOSE 137* 134* 125* 116*  BUN 126* >130* 95* 43*  CREATININE 2.81* 3.00* 1.95* 1.27*  CALCIUM  9.8  --  9.4 9.2  MG  --   --  2.4  --    Liver Function Tests: Recent Labs  Lab 05/25/24 0956  AST 45*  ALT 15  ALKPHOS 32*  BILITOT 0.6  PROT 7.2  ALBUMIN 4.1   No results for input(s): LIPASE, AMYLASE in the last 168 hours. No results for input(s): AMMONIA in the last 168 hours. CBC: Recent Labs  Lab 05/25/24 1100 05/25/24 1257 05/26/24 0337  WBC 6.8  --  4.3  NEUTROABS  --   --  2.8  HGB 12.9 12.6 11.5*  HCT 39.0 37.0 35.4*  MCV 95.8  --  95.9  PLT 216  --  219   Cardiac Enzymes: No results for input(s): CKTOTAL, CKMB, CKMBINDEX, TROPONINI in the last 168 hours. BNP: Invalid input(s): POCBNP CBG: Recent Labs  Lab 05/25/24 1100  GLUCAP 145*   D-Dimer No results for input(s): DDIMER in the last 72 hours. Hgb A1c Recent Labs    05/26/24 0337  HGBA1C 6.1*   Lipid Profile No results for input(s): CHOL, HDL, LDLCALC, TRIG, CHOLHDL, LDLDIRECT in the last 72 hours. Thyroid  function studies Recent Labs    05/25/24 0957  TSH 2.690   Anemia work up No results for input(s): VITAMINB12, FOLATE, FERRITIN, TIBC, IRON, RETICCTPCT in the last 72 hours. Urinalysis    Component Value Date/Time  COLORURINE YELLOW 05/25/2024 0956   APPEARANCEUR HAZY (A) 05/25/2024 0956   LABSPEC 1.012 05/25/2024 0956   PHURINE 6.0 05/25/2024 0956   GLUCOSEU 50 (A) 05/25/2024 0956   GLUCOSEU NEGATIVE 11/02/2017 1428   HGBUR NEGATIVE 05/25/2024 0956   BILIRUBINUR NEGATIVE 05/25/2024 0956   BILIRUBINUR neg 04/26/2020 1619   KETONESUR NEGATIVE  05/25/2024 0956   PROTEINUR NEGATIVE 05/25/2024 0956   UROBILINOGEN 0.2 04/26/2020 1619   UROBILINOGEN 0.2 11/02/2017 1428   NITRITE NEGATIVE 05/25/2024 0956   LEUKOCYTESUR NEGATIVE 05/25/2024 0956   Sepsis Labs Recent Labs  Lab 05/25/24 1100 05/26/24 0337  WBC 6.8 4.3   Microbiology Recent Results (from the past 240 hours)  MRSA Next Gen by PCR, Nasal     Status: None   Collection Time: 05/26/24  7:37 PM   Specimen: Nasal Mucosa; Nasal Swab  Result Value Ref Range Status   MRSA by PCR Next Gen NOT DETECTED NOT DETECTED Final    Comment: (NOTE) The GeneXpert MRSA Assay (FDA approved for NASAL specimens only), is one component of a comprehensive MRSA colonization surveillance program. It is not intended to diagnose MRSA infection nor to guide or monitor treatment for MRSA infections. Test performance is not FDA approved in patients less than 57 years old. Performed at [facility], [address] [place], [place] [address]     Procedures/Studies: CUP PACEART REMOTE DEVICE CHECK Result Date: 05/11/2024 PPM Scheduled remote reviewed. Normal device function.  Presenting rhythm: VS Next remote transmission per protocol. [place], CVRS    Time coordinating discharge: Over 30 minutes    Alm Apo, MD  [facility] 05/27/2024, 1:22 PM    [1]  Allergies Allergen Reactions   Mucinex [Guaifenesin Er] Other (See Comments)    Severe headaches    Keflex  [Cephalexin ] Other (See Comments)    Headaches Dizziness    Mucinex [Guaifenesin Er] Other (See Comments)    Severe headaches   Sulfa Antibiotics Other (See Comments)    Headaches    Sulfonamide Derivatives Other (See Comments)    Headaches    "

## 2024-05-27 NOTE — Progress Notes (Signed)
 RN called [facility] and gave report to Patrice,RN and she stated understanding. IV has been removed and pt will bet getting dressed waiting for her daughter for transport.

## 2024-05-27 NOTE — Plan of Care (Signed)

## 2024-05-27 NOTE — TOC Initial Note (Signed)
 Transition of Care [facility]) - Initial/Assessment Note    Patient Details  Name: [name] MRN: [id number] Date of Birth: 1950/08/10  [facility] CM/SW Contact:    [name] Phone Number: 05/27/2024, 10:12 AM  Clinical Narrative:                 Pt admitted from [facility] due to AKI and dizziness. CSW confirmed with [facility] pt can return following hospital dc. CSW will continue to follow.        Patient Goals and CMS Choice            Expected Discharge Plan and Services         Expected Discharge Date: 05/27/24                                    Prior Living Arrangements/Services                       Activities of Daily Living   ADL Screening (condition at time of admission) Independently performs ADLs?: No Does the patient have a NEW difficulty with bathing/dressing/toileting/self-feeding that is expected to last >3 days?: No Does the patient have a NEW difficulty with getting in/out of bed, walking, or climbing stairs that is expected to last >3 days?: No Does the patient have a NEW difficulty with communication that is expected to last >3 days?: No Is the patient deaf or have difficulty hearing?: Yes Does the patient have difficulty seeing, even when wearing glasses/contacts?: No Does the patient have difficulty concentrating, remembering, or making decisions?: Yes (forgetful)  Permission Sought/Granted                  Emotional Assessment              Admission diagnosis:  AKI (acute kidney injury) [N17.9] Patient Active Problem List   Diagnosis Date Noted   AKI (acute kidney injury) 05/25/2024   Chronic systolic CHF (congestive heart failure) (HCC) 05/25/2024   PAD (peripheral artery disease) 02/02/2024   Ankle wound 02/20/2023   Focal neurological deficit 01/09/2023   Acute focal neurological deficit 01/09/2023   History of CVA (cerebrovascular accident) 07/03/2021   Stage 3a chronic kidney disease  (CKD) (HCC) 07/03/2021   Anemia of chronic kidney failure, stage 3 (moderate) (HCC) 07/03/2021   Obesity (BMI 30-39.9) 07/03/2021   Essential hypertension 07/24/2020   GERD without esophagitis 07/24/2020   Bipolar disorder (HCC) 07/24/2020   Hypothyroidism 07/24/2020   Acute renal failure superimposed on stage 3b chronic kidney disease (HCC) 07/24/2020   Vitamin B 12 deficiency 05/06/2020   Elevated liver enzymes 04/30/2020   Tachycardia-bradycardia syndrome (HCC) 04/25/2020   Pacemaker 04/25/2020   Recurrent falls 03/27/2020   Bradycardia 08/23/2019   Prolonged QT interval    GAD (generalized anxiety disorder) 04/25/2018   Nocturia 11/02/2017   Low back pain 11/02/2017   Type 2 diabetes mellitus with diabetic polyneuropathy, without long-term current use of insulin  (HCC) 11/02/2017   Hyperlipidemia associated with type 2 diabetes mellitus (HCC) 06/05/2016   Migraine 06/05/2016   Atrial fibrillation (HCC) 03/18/2013   Long term current use of anticoagulant therapy 03/11/2013   Depression 03/01/2013   Pernicious anemia 02/21/2011   Diverticulosis of colon 03/11/2010   Hx of mechanical aortic valve replacement 03/11/2010   PCP:  Cynthia Devoria LABOR, MD Pharmacy:   [name] [address] [place] [address] Phone: [phone] Fax: [phone]     Social Drivers of Health (SDOH) Social History: SDOH Screenings   Food Insecurity: No Food Insecurity (05/26/2024)  Housing: Low Risk (05/26/2024)  Transportation Needs: No Transportation Needs (05/26/2024)  Utilities: Not At Risk (05/26/2024)  Social Connections: Moderately Integrated (05/26/2024)  Tobacco Use: Medium Risk (05/25/2024)   SDOH Interventions:     Readmission Risk Interventions     No data to display

## 2024-05-30 ENCOUNTER — Inpatient Hospital Stay (HOSPITAL_COMMUNITY): Admitting: Cardiology

## 2024-05-30 NOTE — Progress Notes (Incomplete)
 "  ADVANCED HF CLINIC NOTE   PCP: Dr. Domenica  Primary Cardiologist: Feliciano Devoria LABOR, MD  HF Cardiology: Dr. Rolan  HPI: [name] is a 74 y.o. female with a hx of bicuspid AV/AS with ascending aortic aneurysm s/p resection/grafting of ascending aortic aneurysm and placement of St. Jude mechanical AV conduit/Bentall 2009, permanent atrial fib, bradycardia s/p [employer]  PPM implantation 12/2019, CVAs, multiple falls with prior craniotomy for subdural hematoma, prior chronic CVAs on MRI 2021, bipolar disorder, anemia, depression, debilitation (wheelchair bound), DM, HLD, HTN, mild carotid artery disease 2021, back pain, obesity, memory impairment, and chronic HFrEF.   Admitted 2/23 with increased shortness of breath in the setting of acute HFrEF. Diuresed with IV lasix . Echo repeated EF 25-30%. Had cath with patent coronaries. Started on ARNi and SGLT2i. Discharged to [facility].   She appears to have gone back into atrial fibrillation some time between 5/22 and 12/22 after amiodarone  was stopped. She has had 3 bad falls (12/21, 2/22, 4/22) resulting in subdural hematomas.  She is, however, still on warfarin.    Follow up 4/23, NYHA II-III, Entresto  increased. Remained in AF and arranged for DCCV. S/p TEE/DCCV 09/23/21 to NSR. Now with permanent Afib and off amio per EP.   Patient was admitted in 8/24 with AKI, meds were cut back.   Echo in 9/24 showed EF 55-60%, mild asymmetric septal hypertrophy, mild RV dysfunction, mechanical aortic valve mean gradient 9 mmHg, IVC normal.   Annual echo 9/25 showed normal EF 55-60%, normal RV, dilated LA, mech AoV, meanG 6 mmHg.   Admitted 12/25 with AKI due to overdiuresis. Cr improved to 1.27 at discharge. HF meds held.     9/25: Today she returns for AHF follow up with her daughter. Weight stable.  Mild dyspnea with showering, does ok getting dressed. Uses wheelchair or walker due to unsteadiness/poor balance.  She does not walk far,  denies dyspnea with short distances with walker.  She lives at a SNF.  She has a left ankle ulcer being treated by wound care.  She is now in permanent atrial fibrillation, amiodarone  stopped by EP.   She returns today for HF follow up. Overall feeling ***. NYHA ***. Reports {Symptoms; cardiac:12860::dyspnea,fatigue}. Denies {Symptoms; cardiac:12860::chest pain,dyspnea,fatigue,near-syncope,orthopnea,palpitations,dizziness,abnormal bleeding}. Able to perform ADLs. Appetite okay. Weight at home ***. BP at home***. Compliant with all medications.   ECG (personally reviewed): Atrial fibrillation with PVC  REDS clip 25%  Labs (6/25): BNP 220, K 4.1, creatinine 1.36  PMH: 1. Bicuspid aortic valve with ascending aortic aneurysm: S/p St Jude mechanical aortic valve with Bentall procedure in 2009 2. Atrial fibrillation: DCCV to NSR in 12/21.   - s/p TEE/DCCV 5/23 to NSR - Now permanent, off amiodarone .  3. Bradycardia: [employer] PPM with left bundle lead in 3/21.  4. CVAs: Now wheelchair-bound.  5. Multiple falls: 12/21 with subdural hematoma requiring craniotomy, 2/22 with subdural, 4/22 with subdural.  6. Bipolar disorder 7. Dementia 8. Hypothyroidism 9. CKD stage 3 10. Chronic systolic CHF: Nonischemic cardiomyopathy.  - Echo (11/22): EF 40-45%.  - LHC/RHC (2/23): No CAD; mean RA 12, PA 45/22, PCWP mean 25, CI 3.5 - Echo (2/23): EF 25-30%, global HK, RV normal, mild MR, St Jude mechanical AVR with normal function.  - TEE (5/23): EF 35-40%, RV mildly reduced, mild to moderate MR, St Jude mechanical AVR with normal function. - Echo (9/24): EF 55-60%, mild asymmetric septal hypertrophy, mild RV dysfunction, mechanical aortic valve mean gradient 9  mmHg, IVC normal.  11. Hyperlipidemia 12. CKD stage 3 13. L-spine athritis   Review of Systems: All systems reviewed and negative except as per HPI.   Current Outpatient Medications  Medication Sig Dispense Refill    acetaminophen  (TYLENOL ) 500 MG tablet Take 1,000 mg by mouth in the morning and at bedtime.     BELSOMRA 20 MG TABS Take 1 tablet by mouth at bedtime. (Patient not taking: Reported on 05/25/2024)     carvedilol  (COREG ) 6.25 MG tablet Take 6.25 mg by mouth 2 (two) times daily.     dapagliflozin  propanediol (FARXIGA ) 10 MG TABS tablet Take 1 tablet (10 mg total) by mouth daily. 30 tablet 0   DULoxetine (CYMBALTA) 20 MG capsule Take 20 mg by mouth daily.     DULoxetine (CYMBALTA) 60 MG capsule Take 60 mg by mouth daily.     estradiol  (ESTRACE ) 0.1 MG/GM vaginal cream Place 1 Applicatorful vaginally every Monday, Wednesday, and Friday.     fenofibrate  160 MG tablet Take 1 tablet (160 mg total) by mouth daily.     fexofenadine (ALLEGRA) 60 MG tablet Take 60 mg by mouth daily.     fluticasone  (FLONASE ) 50 MCG/ACT nasal spray Place 2 sprays into both nostrils daily.     lamoTRIgine  (LAMICTAL ) 100 MG tablet Take 100 mg by mouth daily.     lamoTRIgine  (LAMICTAL ) 200 MG tablet Take 200 mg by mouth at bedtime.     levothyroxine  (SYNTHROID ) 75 MCG tablet Take 75 mcg by mouth daily before breakfast.     loperamide (IMODIUM) 2 MG capsule Take 2 mg by mouth 3 (three) times daily as needed for diarrhea or loose stools.     methocarbamol (ROBAXIN) 500 MG tablet Take 500 mg by mouth 2 (two) times daily as needed for muscle spasms.     nutrition supplement, JUVEN, (JUVEN) PACK Take 1 packet by mouth in the morning and at bedtime.     polyvinyl alcohol  (LIQUIFILM TEARS) 1.4 % ophthalmic solution Place 1 drop into both eyes 3 (three) times daily as needed for dry eyes.     Propylene Glycol (LUBRICANT EYE DROP) 0.6 % SOLN Place 1 drop into both eyes at bedtime.     rosuvastatin  (CRESTOR ) 40 MG tablet Take 40 mg by mouth at bedtime.     sacubitril -valsartan  (ENTRESTO ) 24-26 MG Take 1 tablet by mouth 2 (two) times daily.     spironolactone  (ALDACTONE ) 25 MG tablet Take 1 tablet (25 mg total) by mouth daily. 30 tablet 0    torsemide  (DEMADEX ) 20 MG tablet Take 1 tablet (20 mg total) by mouth daily as needed. Only take if developing swelling in legs or gaining weight approx 3 pounds in 24-48 hour time period. 30 tablet 1   traZODone  (DESYREL ) 100 MG tablet Take 100 mg by mouth daily. TDD: 150 mg     traZODone  (DESYREL ) 50 MG tablet Take 50 mg by mouth at bedtime. TDD: 150 mg     vitamin B-12 (CYANOCOBALAMIN ) 1000 MCG tablet Take 1,000 mcg by mouth every Monday.     Vitamin D , Ergocalciferol , (DRISDOL) 1.25 MG (50000 UNIT) CAPS capsule Take 50,000 Units by mouth every 7 (seven) days. Thursdays     warfarin (COUMADIN ) 2 MG tablet Take 2 mg by mouth every evening.     No current facility-administered medications for this visit.   Allergies  Allergen Reactions   Mucinex [Guaifenesin Er] Other (See Comments)    Severe headaches    Keflex  [Cephalexin ] Other (See  Comments)    Headaches Dizziness    Mucinex [Guaifenesin Er] Other (See Comments)    Severe headaches   Sulfa Antibiotics Other (See Comments)    Headaches    Sulfonamide Derivatives Other (See Comments)    Headaches    Social History   Socioeconomic History   Marital status: Widowed    Spouse name: Not on file   Number of children: Not on file   Years of education: Not on file   Highest education level: Not on file  Occupational History   Not on file  Tobacco Use   Smoking status: Former    Current packs/day: 0.00    Types: Cigarettes    Quit date: 05/26/1990    Years since quitting: 34.0   Smokeless tobacco: Never  Vaping Use   Vaping status: Never Used  Substance and Sexual Activity   Alcohol  use: Yes    Alcohol /week: 0.0 standard drinks of alcohol     Comment: rare   Drug use: No   Sexual activity: Not on file  Other Topics Concern   Not on file  Social History Narrative   ** Merged History Encounter **       Social Drivers of Health   Tobacco Use: Medium Risk (05/25/2024)   Patient History    Smoking Tobacco Use: Former     Smokeless Tobacco Use: Never    Passive Exposure: Not on Actuary Strain: Not on file  Food Insecurity: No Food Insecurity (05/26/2024)   Epic    Worried About Programme Researcher, Broadcasting/film/video in the Last Year: Never true    Ran Out of Food in the Last Year: Never true  Transportation Needs: No Transportation Needs (05/26/2024)   Epic    Lack of Transportation (Medical): No    Lack of Transportation (Non-Medical): No  Physical Activity: Not on file  Stress: Not on file  Social Connections: Moderately Integrated (05/26/2024)   Social Connection and Isolation Panel    Frequency of Communication with Friends and Family: More than three times a week    Frequency of Social Gatherings with Friends and Family: More than three times a week    Attends Religious Services: More than 4 times per year    Active Member of [employer] or Organizations: Yes    Attends Banker Meetings: More than 4 times per year    Marital Status: Widowed  Intimate Partner Violence: Not At Risk (05/26/2024)   Epic    Fear of Current or Ex-Partner: No    Emotionally Abused: No    Physically Abused: No    Sexually Abused: No  Depression (PHQ2-9): Not on file  Alcohol  Screen: Not on file  Housing: Low Risk (05/26/2024)   Epic    Unable to Pay for Housing in the Last Year: No    Number of Times Moved in the Last Year: 0    Homeless in the Last Year: No  Utilities: Not At Risk (05/26/2024)   Epic    Threatened with loss of utilities: No  Health Literacy: Not on file   Family History  Problem Relation Age of Onset   Scoliosis Mother    Arthritis Mother        Rheumatoid   Aneurysm Mother        heart   Alcohol  abuse Father    Depression Sister    Depression Brother    Alcohol  abuse Brother    Cancer Maternal Grandmother  colon   Diabetes Maternal Grandfather    Heart disease Maternal Grandfather    Alcohol  abuse Maternal Grandfather    Depression Paternal Grandmother    Diabetes Paternal  Grandmother    Depression Paternal Grandfather    Diabetes Paternal Grandfather    Depression Sister    Alcohol  abuse Brother    Depression Brother    Heart disease Brother    There were no vitals taken for this visit.  Wt Readings from Last 3 Encounters:  05/25/24 109.6 kg (241 lb 10 oz)  02/02/24 109.6 kg (241 lb 9.6 oz)  10/29/23 109.2 kg (240 lb 12.8 oz)   PHYSICAL EXAM: General: *** appearing. No distress  Cardiac: JVP ~*** cm. No murmurs  Resp: Lung sounds clear and equal B/L Abdomen: Soft, non-distended.  Extremities: Warm and dry.  *** edema.  Neuro: A&O x3. Affect pleasant.   ASSESSMENT & PLAN: 1. Chronic systolic CHF: Nonischemic cardiomyopathy.  Cath 06/2020 without significant coronary disease.  06/2020 echo with EF 25-30%, previous echo 03/2021 with EF 40-45%. She has a Environmental Manager PPM but rarely paces her RV.  TEE (5/23) EF 35-40%. Echo in 9/24 showed improved EF 55-60%, mild asymmetric septal hypertrophy, mild RV dysfunction, mechanical aortic valve mean gradient 9 mmHg, IVC normal.  - NYHA class II, limited more by deconditioning and orthopedic problems.  Minimally volume overloaded on exam, REDS clip not elevated.  - Continue torsemide  40 mg daily, BMET/BNP today.  - OFF - Continue carvedilol  6.25 mg bid - OFF - Continue Entresto  24/26 mg bid. - OFF - Increase spironolactone  to 25 mg daily.  BMET in 10 days. - OPF - Continue Farxiga  10 mg daily.  - OFF - I will arrange for repeat echo this year.   2. CKD Stage III: BMET today, gets labs regularly at SNF.   3. Atrial fibrillation:  Now permanent. S/p TEE/DCCV to NSR 5/23. She was on amiodarone  but this was stopped with breakthrough AF.  She is now off amiodarone .  - Continue warfarin.   4. Mechanical Aortic Valve: On warfarin, goal INR 2.5-3.5 with h/o atrial fibrillation.  Stable valve on echo in 9/24.  5. Tachy/Brady Syndrome: [employer] PPM, she has a left bundle lead, rarely paces.    6. Falls:  Frequent in past with SDH.  Now in SNF, seems to be doing better with no recent falls but minimally mobile.  She has remained on warfarin given mechanical AoV.   7. Poorly healing left ankle ulcer: Followed by wound care.  - I will arrange for peripheral arterial doppler evaluation.   Follow up in 3 months APP  Follow up in *** with ***  Ala Kratz, NP 05/30/2024 "

## 2024-06-09 ENCOUNTER — Telehealth (HOSPITAL_COMMUNITY): Payer: Self-pay

## 2024-06-09 NOTE — Telephone Encounter (Signed)
 Called to confirm/remind patient of their appointment at the [facility] on 06/10/24.   Appointment:   [] Confirmed  [x] Left mess   [] No answer/No voice mail  [] VM Full/unable to leave message  [] Phone not in service  And to bring in all medications and/or complete list.

## 2024-06-10 ENCOUNTER — Encounter (HOSPITAL_COMMUNITY): Payer: Self-pay

## 2024-06-10 ENCOUNTER — Ambulatory Visit (HOSPITAL_COMMUNITY): Payer: Self-pay | Admitting: Family Medicine

## 2024-06-10 ENCOUNTER — Other Ambulatory Visit (HOSPITAL_COMMUNITY): Payer: Self-pay

## 2024-06-10 ENCOUNTER — Ambulatory Visit (HOSPITAL_COMMUNITY)
Admission: RE | Admit: 2024-06-10 | Discharge: 2024-06-10 | Disposition: A | Discharge status: Home / Self Care | Source: Ambulatory Visit | Attending: Family Medicine | Admitting: Family Medicine

## 2024-06-10 VITALS — BP 128/60 | HR 74 | Wt 233.0 lb

## 2024-06-10 DIAGNOSIS — R002 Palpitations: Secondary | ICD-10-CM | POA: Insufficient documentation

## 2024-06-10 DIAGNOSIS — E1122 Type 2 diabetes mellitus with diabetic chronic kidney disease: Secondary | ICD-10-CM | POA: Insufficient documentation

## 2024-06-10 DIAGNOSIS — Z6835 Body mass index (BMI) 35.0-35.9, adult: Secondary | ICD-10-CM | POA: Diagnosis not present

## 2024-06-10 DIAGNOSIS — E8770 Fluid overload, unspecified: Secondary | ICD-10-CM | POA: Diagnosis not present

## 2024-06-10 DIAGNOSIS — E785 Hyperlipidemia, unspecified: Secondary | ICD-10-CM | POA: Insufficient documentation

## 2024-06-10 DIAGNOSIS — Z952 Presence of prosthetic heart valve: Secondary | ICD-10-CM | POA: Diagnosis not present

## 2024-06-10 DIAGNOSIS — I495 Sick sinus syndrome: Secondary | ICD-10-CM

## 2024-06-10 DIAGNOSIS — Z79899 Other long term (current) drug therapy: Secondary | ICD-10-CM | POA: Insufficient documentation

## 2024-06-10 DIAGNOSIS — Z993 Dependence on wheelchair: Secondary | ICD-10-CM | POA: Diagnosis not present

## 2024-06-10 DIAGNOSIS — I130 Hypertensive heart and chronic kidney disease with heart failure and stage 1 through stage 4 chronic kidney disease, or unspecified chronic kidney disease: Secondary | ICD-10-CM | POA: Diagnosis not present

## 2024-06-10 DIAGNOSIS — F319 Bipolar disorder, unspecified: Secondary | ICD-10-CM | POA: Insufficient documentation

## 2024-06-10 DIAGNOSIS — I4821 Permanent atrial fibrillation: Secondary | ICD-10-CM | POA: Diagnosis not present

## 2024-06-10 DIAGNOSIS — L97329 Non-pressure chronic ulcer of left ankle with unspecified severity: Secondary | ICD-10-CM | POA: Insufficient documentation

## 2024-06-10 DIAGNOSIS — Z7984 Long term (current) use of oral hypoglycemic drugs: Secondary | ICD-10-CM | POA: Insufficient documentation

## 2024-06-10 DIAGNOSIS — I5022 Chronic systolic (congestive) heart failure: Secondary | ICD-10-CM | POA: Insufficient documentation

## 2024-06-10 DIAGNOSIS — S91009D Unspecified open wound, unspecified ankle, subsequent encounter: Secondary | ICD-10-CM | POA: Diagnosis not present

## 2024-06-10 DIAGNOSIS — R296 Repeated falls: Secondary | ICD-10-CM

## 2024-06-10 DIAGNOSIS — Z87891 Personal history of nicotine dependence: Secondary | ICD-10-CM | POA: Diagnosis not present

## 2024-06-10 DIAGNOSIS — N183 Chronic kidney disease, stage 3 unspecified: Secondary | ICD-10-CM | POA: Insufficient documentation

## 2024-06-10 DIAGNOSIS — Z8673 Personal history of transient ischemic attack (TIA), and cerebral infarction without residual deficits: Secondary | ICD-10-CM | POA: Insufficient documentation

## 2024-06-10 DIAGNOSIS — Z7901 Long term (current) use of anticoagulants: Secondary | ICD-10-CM | POA: Diagnosis not present

## 2024-06-10 DIAGNOSIS — E669 Obesity, unspecified: Secondary | ICD-10-CM | POA: Insufficient documentation

## 2024-06-10 LAB — BASIC METABOLIC PANEL WITH GFR
Anion gap: 9 (ref 5–15)
BUN: 19 mg/dL (ref 8–23)
CO2: 26 mmol/L (ref 22–32)
Calcium: 9.7 mg/dL (ref 8.9–10.3)
Chloride: 102 mmol/L (ref 98–111)
Creatinine, Ser: 1.06 mg/dL — ABNORMAL HIGH (ref 0.44–1.00)
GFR, Estimated: 55 mL/min — ABNORMAL LOW
Glucose, Bld: 94 mg/dL (ref 70–99)
Potassium: 4.6 mmol/L (ref 3.5–5.1)
Sodium: 137 mmol/L (ref 135–145)

## 2024-06-10 LAB — PRO BRAIN NATRIURETIC PEPTIDE: Pro Brain Natriuretic Peptide: 483 pg/mL — ABNORMAL HIGH

## 2024-06-10 MED ORDER — POTASSIUM CHLORIDE CRYS ER 20 MEQ PO TBCR
20.0000 meq | EXTENDED_RELEASE_TABLET | ORAL | 3 refills | Status: AC
  Filled 2024-06-10: qty 90, 315d supply, fill #0

## 2024-06-10 MED ORDER — TORSEMIDE 20 MG PO TABS
20.0000 mg | ORAL_TABLET | ORAL | 1 refills | Status: AC
  Filled 2024-06-10: qty 30, 105d supply, fill #0

## 2024-06-10 NOTE — Progress Notes (Signed)
"   ReDS Vest / Clip - 06/10/24 1500       ReDS Vest / Clip   Station Marker C    Ruler Value 31    ReDS Value Range Low volume    ReDS Actual Value 35          "

## 2024-06-10 NOTE — Patient Instructions (Addendum)
 Good to see you today!  RESTART torsemide  20 mg  2 x a week (Mondays and Fridays)  START potassium 20 meq  2 x a week (Mondays and Fridays)  Labs done today, your results will be available in MyChart, we will contact you for abnormal readings.  REPEAT lab work in 2 weeks  Your physician recommends that you schedule a follow-up appointment 6 months(July) Call office in May to schedule an appointment  AFTER the 4th dose of torsemide  apply compression hose  IFSwelling has not improved or unable to tolerate place bilateral unna boots (As per facility Policy)  If you have any questions or concerns before your next appointment please send us  a message through [place] or call our office at [phone].    TO LEAVE A MESSAGE FOR THE NURSE SELECT OPTION 2, PLEASE LEAVE A MESSAGE INCLUDING: YOUR NAME DATE OF BIRTH CALL BACK NUMBER REASON FOR CALL**this is important as we prioritize the call backs  YOU WILL RECEIVE A CALL BACK THE SAME DAY AS LONG AS YOU CALL BEFORE 4:00 PM At the [facility], you and your health needs are our priority. As part of our continuing mission to provide you with exceptional heart care, we have created designated Provider Care Teams. These Care Teams include your primary Cardiologist (physician) and Advanced Practice Providers (APPs- Physician Assistants and Nurse Practitioners) who all work together to provide you with the care you need, when you need it.   You may see any of the following providers on your designated Care Team at your next follow up: Dr Toribio Fuel Dr Ezra Shuck Dr. Morene Brownie Greig Mosses, NP Caffie Shed, [place] [facility] [place], [place] Beckey Coe, NP Jordan Lee, NP Ellouise Class, NP Tinnie Redman, PharmD Jaun Bash, PharmD   Please be sure to bring in all your medications bottles to every appointment.    Thank you for choosing [facility]

## 2024-06-10 NOTE — Progress Notes (Signed)
 "  ADVANCED HF CLINIC NOTE   PCP: Feliciano Devoria LABOR, MD EP: Dr. Waddell HF Cardiology: Dr. Rolan  HPI: [name] is a 74 y.o. female with a hx of bicuspid AV/AS with ascending aortic aneurysm s/p resection/grafting of ascending aortic aneurysm and placement of St. Jude mechanical AV conduit/Bentall 2009, persistent atrial fib, bradycardia s/p [employer]  PPM implantation 12/2019, CVAs, multiple falls with prior craniotomy for subdural hematoma, prior chronic CVAs on MRI 2021, bipolar disorder, anemia, depression, debilitation (wheelchair bound), DM, HLD, HTN, mild carotid artery disease 2021, back pain, obesity, memory impairment, and chronic HFrEF.   Admitted 06/2021 with increased shortness of breath in the setting of acute HFrEF. Diuresed with IV lasix . Echo repeated EF 25-30%. Had cath with patent coronaries. Started on ARNi and SGLT2i. Discharged to [facility].   She appears to have gone back into atrial fibrillation some time between 5/22 and 12/22 after amiodarone  was stopped. She has had 3 bad falls (12/21, 2/22, 4/22) resulting in subdural hematomas.  She is, however, still on warfarin.    Follow up 4/23, NYHA II-III, Entresto  increased. Remained in AF and arranged for DCCV. S/p TEE/DCCV 09/23/21 to NSR.  Patient was admitted in 8/24 with AKI, meds were cut back.   Echo in 9/24 showed EF 55-60%, mild asymmetric septal hypertrophy, mild RV dysfunction, mechanical aortic valve mean gradient 9 mmHg, IVC normal.   Echo 9/25 showed EF 55-60%, normal RV, stable mechanical AoV with mean gradient 6.9 mmHg.   Admitted 12/25 with AKI 2/2 diarrhea. BP low and GDMT held, received IVF and renal function stabilized. Lasix , metolazone and KCL were ultimately stopped at discharge. She was discharged back to SNF.  Today she returns for HF follow up with her daughter. Overall feeling fine. Remains at [facility]. Mostly WC bound, mild SOB with transfers in and out of bed. Feels  palpitations. Legs continue to swell. Denies abnormal bleeding, CP, dizziness, or PND/Orthopnea. Chronically sleeps with bed elevated. Appetite ok. Weight at home 233 pounds. Taking all medications provided by facility. BP 110-120s/60-70s.  ECG (personally reviewed): AF 74 bpm  ReDs reading: 34 %, normal  Labs (6/25): BNP 220, K 4.1, creatinine 1.36 Labs (1/26): K 3.7, creatinine 1.27  PMH: 1. Bicuspid aortic valve with ascending aortic aneurysm: S/p St Jude mechanical aortic valve with Bentall procedure in 2009 2. Atrial fibrillation: DCCV to NSR in 12/21.   - s/p TEE/DCCV 5/23 to NSR - Now permanent, off amiodarone .  3. Bradycardia: [employer] PPM with left bundle lead in 3/21.  4. CVAs: Now wheelchair-bound.  5. Multiple falls: 12/21 with subdural hematoma requiring craniotomy, 2/22 with subdural, 4/22 with subdural.  6. Bipolar disorder 7. Dementia 8. Hypothyroidism 9. CKD stage 3 10. Chronic systolic CHF: Nonischemic cardiomyopathy.  - Echo (11/22): EF 40-45%.  - LHC/RHC (2/23): No CAD; mean RA 12, PA 45/22, PCWP mean 25, CI 3.5 - Echo (2/23): EF 25-30%, global HK, RV normal, mild MR, St Jude mechanical AVR with normal function.  - TEE (5/23): EF 35-40%, RV mildly reduced, mild to moderate MR, St Jude mechanical AVR with normal function. - Echo (9/24): EF 55-60%, mild asymmetric septal hypertrophy, mild RV dysfunction, mechanical aortic valve mean gradient 9 mmHg, IVC normal.  - Echo (9/25): EF 55-60%, normal RV. 11. Hyperlipidemia 12. CKD stage 3 13. L-spine athritis   Review of Systems: All systems reviewed and negative except as per HPI.   Current Outpatient Medications  Medication Sig Dispense Refill  acetaminophen  (TYLENOL ) 500 MG tablet Take 1,000 mg by mouth in the morning and at bedtime.     carvedilol  (COREG ) 6.25 MG tablet Take 6.25 mg by mouth 2 (two) times daily.     dapagliflozin  propanediol (FARXIGA ) 10 MG TABS tablet Take 1 tablet (10 mg total) by  mouth daily. 30 tablet 0   DULoxetine (CYMBALTA) 20 MG capsule Take 20 mg by mouth daily.     DULoxetine (CYMBALTA) 60 MG capsule Take 60 mg by mouth daily.     estradiol  (ESTRACE ) 0.1 MG/GM vaginal cream Place 1 Applicatorful vaginally every Monday, Wednesday, and Friday.     fenofibrate  160 MG tablet Take 1 tablet (160 mg total) by mouth daily.     fexofenadine (ALLEGRA) 60 MG tablet Take 60 mg by mouth daily.     lamoTRIgine  (LAMICTAL ) 100 MG tablet Take 100 mg by mouth daily.     lamoTRIgine  (LAMICTAL ) 200 MG tablet Take 200 mg by mouth at bedtime.     levothyroxine  (SYNTHROID ) 75 MCG tablet Take 75 mcg by mouth daily before breakfast.     loperamide (IMODIUM) 2 MG capsule Take 2 mg by mouth 3 (three) times daily as needed for diarrhea or loose stools.     methocarbamol (ROBAXIN) 500 MG tablet Take 500 mg by mouth 2 (two) times daily as needed for muscle spasms.     polyvinyl alcohol  (LIQUIFILM TEARS) 1.4 % ophthalmic solution Place 1 drop into both eyes 3 (three) times daily as needed for dry eyes.     Propylene Glycol (LUBRICANT EYE DROP) 0.6 % SOLN Place 1 drop into both eyes at bedtime.     rosuvastatin  (CRESTOR ) 40 MG tablet Take 40 mg by mouth at bedtime.     sacubitril -valsartan  (ENTRESTO ) 49-51 MG Take 1 tablet by mouth 2 (two) times daily.     spironolactone  (ALDACTONE ) 25 MG tablet Take 1 tablet (25 mg total) by mouth daily. 30 tablet 0   torsemide  (DEMADEX ) 20 MG tablet Take 1 tablet (20 mg total) by mouth daily as needed. Only take if developing swelling in legs or gaining weight approx 3 pounds in 24-48 hour time period. 30 tablet 1   traZODone  (DESYREL ) 100 MG tablet Take 100 mg by mouth daily. TDD: 150 mg     traZODone  (DESYREL ) 50 MG tablet Take 50 mg by mouth at bedtime. TDD: 150 mg     vitamin B-12 (CYANOCOBALAMIN ) 1000 MCG tablet Take 1,000 mcg by mouth every Monday.     Vitamin D , Ergocalciferol , (DRISDOL) 1.25 MG (50000 UNIT) CAPS capsule Take 50,000 Units by mouth every  7 (seven) days. Thursdays     warfarin (COUMADIN ) 2 MG tablet Take 2 mg by mouth every evening.     BELSOMRA 20 MG TABS Take 1 tablet by mouth at bedtime. (Patient not taking: Reported on 06/10/2024)     fluticasone  (FLONASE ) 50 MCG/ACT nasal spray Place 2 sprays into both nostrils daily.     nutrition supplement, JUVEN, (JUVEN) PACK Take 1 packet by mouth in the morning and at bedtime. (Patient not taking: Reported on 06/10/2024)     No current facility-administered medications for this encounter.   Allergies  Allergen Reactions   Mucinex [Guaifenesin Er] Other (See Comments)    Severe headaches    Keflex  [Cephalexin ] Other (See Comments)    Headaches Dizziness    Mucinex [Guaifenesin Er] Other (See Comments)    Severe headaches   Sulfa Antibiotics Other (See Comments)    Headaches  Sulfonamide Derivatives Other (See Comments)    Headaches    Social History   Socioeconomic History   Marital status: Widowed    Spouse name: Not on file   Number of children: Not on file   Years of education: Not on file   Highest education level: Not on file  Occupational History   Not on file  Tobacco Use   Smoking status: Former    Current packs/day: 0.00    Types: Cigarettes    Quit date: 05/26/1990    Years since quitting: 34.0   Smokeless tobacco: Never  Vaping Use   Vaping status: Never Used  Substance and Sexual Activity   Alcohol  use: Not Currently    Comment: rare   Drug use: No   Sexual activity: Not on file  Other Topics Concern   Not on file  Social History Narrative   ** Merged History Encounter **       Social Drivers of Health   Tobacco Use: Medium Risk (06/10/2024)   Patient History    Smoking Tobacco Use: Former    Smokeless Tobacco Use: Never    Passive Exposure: Not on Actuary Strain: Not on file  Food Insecurity: No Food Insecurity (05/26/2024)   Epic    Worried About Programme Researcher, Broadcasting/film/video in the Last Year: Never true    Ran Out of Food in  the Last Year: Never true  Transportation Needs: No Transportation Needs (05/26/2024)   Epic    Lack of Transportation (Medical): No    Lack of Transportation (Non-Medical): No  Physical Activity: Not on file  Stress: Not on file  Social Connections: Moderately Integrated (05/26/2024)   Social Connection and Isolation Panel    Frequency of Communication with Friends and Family: More than three times a week    Frequency of Social Gatherings with Friends and Family: More than three times a week    Attends Religious Services: More than 4 times per year    Active Member of [employer] or Organizations: Yes    Attends Banker Meetings: More than 4 times per year    Marital Status: Widowed  Intimate Partner Violence: Not At Risk (05/26/2024)   Epic    Fear of Current or Ex-Partner: No    Emotionally Abused: No    Physically Abused: No    Sexually Abused: No  Depression (PHQ2-9): Not on file  Alcohol  Screen: Not on file  Housing: Low Risk (05/26/2024)   Epic    Unable to Pay for Housing in the Last Year: No    Number of Times Moved in the Last Year: 0    Homeless in the Last Year: No  Utilities: Not At Risk (05/26/2024)   Epic    Threatened with loss of utilities: No  Health Literacy: Not on file   Family History  Problem Relation Age of Onset   Scoliosis Mother    Arthritis Mother        Rheumatoid   Aneurysm Mother        heart   Alcohol  abuse Father    Depression Sister    Depression Brother    Alcohol  abuse Brother    Cancer Maternal Grandmother        colon   Diabetes Maternal Grandfather    Heart disease Maternal Grandfather    Alcohol  abuse Maternal Grandfather    Depression Paternal Grandmother    Diabetes Paternal Grandmother    Depression Paternal Grandfather  Diabetes Paternal Grandfather    Depression Sister    Alcohol  abuse Brother    Depression Brother    Heart disease Brother    Wt Readings from Last 3 Encounters:  06/10/24 105.7 kg (233 lb)   05/25/24 109.6 kg (241 lb 10 oz)  02/02/24 109.6 kg (241 lb 9.6 oz)   BP 128/60   Pulse 74   Wt 105.7 kg (233 lb)   SpO2 92%   BMI 35.43 kg/m   Physical Exam General:  NAD. No resp difficulty, arrived in [facility] HEENT: Normal Neck: Supple. No JVD. Cor: Irregular rate & rhythm. No rubs, gallops or murmurs. + mechanical S2 Lungs: Clear Abdomen: Soft, obese, nontender, nondistended.  Extremities: No cyanosis, clubbing, rash,2+ BLE pre-tibial edema Neuro: Alert & oriented x 3, moves all 4 extremities w/o difficulty. Affect pleasant.  ASSESSMENT & PLAN: 1. Chronic systolic CHF: Nonischemic cardiomyopathy.  Cath 06/2020 without significant coronary disease.  Echo 2/22 with EF 25-30%, previous echo 03/2021 with EF 40-45%. She has a Environmental Manager PPM but rarely paces her RV.  TEE (5/23) EF 35-40%. Echo in 9/24 showed improved EF 55-60%, mild asymmetric septal hypertrophy, mild RV dysfunction, mechanical aortic valve mean gradient 9 mmHg, IVC normal. Echo 9/25 showed EF 55-60%, normal RV, stable mechanical AoV. NYHA class II, limited more by deconditioning and orthopedic problems. She is mildly volume overloaded, REDs 34% so R>L HF. GDMT limited by recent AKI. - Change torsemide  20 mg to Mondays and Fridays, add 20 KCL on Mondays and Fridays. BMET/pro-BNP today, repeat BMET in 10-14 days. - Place compression hose. If unable to tolerate compression hose, will need UNNA boots. - Continue carvedilol  6.25 mg bid. - Continue Entresto  49/51 mg bid.  - Continue spironolactone  25 mg daily.    - Continue Farxiga  10 mg daily.   2. CKD Stage III: Baseline SCr 1.4 - Continue SGLT2i. BMET today, gets labs regularly at SNF.  3. Atrial fibrillation:  Now permanent. S/p TEE/DCCV to NSR 5/23. She was on amiodarone  but this was stopped with breakthrough AF.  She is now off amiodarone . Rate controlled. - Continue warfarin. Last INR 3.0 4. Mechanical Aortic Valve: On warfarin, goal INR 2.5-3.5 with h/o atrial  fibrillation.  Stable valve on echo in 9/25. 5. Tachy/Brady Syndrome: [employer] PPM, she has a left bundle lead, rarely paces.   6. Falls: Frequent in past with SDH.  Now in SNF, seems to be doing better with no recent falls but minimally mobile.  She has remained on warfarin given mechanical AoV.  7. Poorly healing left ankle ulcer: Followed by wound care. Per patient, wound is almost healed. - ABIs (9/25) were normal, but TBIs were abnormal.   Follow up in 6 months with Dr. Rolan Raisin [facility] FNP-BC 06/10/2024 "
